# Patient Record
Sex: Female | Born: 1970
Health system: Southern US, Community
[De-identification: ages and names within clinical notes are randomized; demographics above are authoritative.]

## PROBLEM LIST (undated history)

## (undated) DIAGNOSIS — H5410 Blindness, one eye, low vision other eye, unspecified eyes: Secondary | ICD-10-CM

## (undated) DIAGNOSIS — H5440 Blindness, one eye, unspecified eye: Secondary | ICD-10-CM

## (undated) DIAGNOSIS — N289 Disorder of kidney and ureter, unspecified: Secondary | ICD-10-CM

## (undated) DIAGNOSIS — N186 End stage renal disease: Secondary | ICD-10-CM

## (undated) DIAGNOSIS — E785 Hyperlipidemia, unspecified: Secondary | ICD-10-CM

## (undated) DIAGNOSIS — I10 Essential (primary) hypertension: Secondary | ICD-10-CM

## (undated) DIAGNOSIS — Z992 Dependence on renal dialysis: Secondary | ICD-10-CM

## (undated) DIAGNOSIS — D649 Anemia, unspecified: Secondary | ICD-10-CM

## (undated) DIAGNOSIS — E119 Type 2 diabetes mellitus without complications: Secondary | ICD-10-CM

## (undated) DIAGNOSIS — H5450 Low vision, one eye, unspecified eye: Secondary | ICD-10-CM

## (undated) DIAGNOSIS — Z9289 Personal history of other medical treatment: Secondary | ICD-10-CM

## (undated) DIAGNOSIS — E039 Hypothyroidism, unspecified: Secondary | ICD-10-CM

## (undated) DIAGNOSIS — H409 Unspecified glaucoma: Secondary | ICD-10-CM

## (undated) DIAGNOSIS — J189 Pneumonia, unspecified organism: Secondary | ICD-10-CM

## (undated) HISTORY — DX: Essential (primary) hypertension: I10

## (undated) HISTORY — PX: TOE SURGERY: SHX1073

## (undated) HISTORY — DX: Unspecified glaucoma: H40.9

## (undated) HISTORY — DX: Disorder of kidney and ureter, unspecified: N28.9

## (undated) HISTORY — PX: CHOLECYSTECTOMY: SHX55

## (undated) HISTORY — DX: Type 2 diabetes mellitus without complications: E11.9

## (undated) HISTORY — PX: EYE SURGERY: SHX253

## (undated) HISTORY — PX: ANKLE FRACTURE SURGERY: SHX122

---

## 2012-07-31 DIAGNOSIS — H2140 Pupillary membranes, unspecified eye: Secondary | ICD-10-CM | POA: Insufficient documentation

## 2012-07-31 DIAGNOSIS — H4050X0 Glaucoma secondary to other eye disorders, unspecified eye, stage unspecified: Secondary | ICD-10-CM | POA: Insufficient documentation

## 2015-02-02 ENCOUNTER — Encounter: Payer: Self-pay | Admitting: Nutrition

## 2015-02-16 ENCOUNTER — Telehealth: Payer: Self-pay | Admitting: Nutrition

## 2015-02-16 NOTE — Telephone Encounter (Signed)
Called and left message to call to reschedule appointment. Jearld Fenton, RDN CDE

## 2015-07-15 ENCOUNTER — Telehealth: Payer: Self-pay | Admitting: Nutrition

## 2015-07-15 NOTE — Telephone Encounter (Signed)
Talked to her about rescheduling missed appointment. She will call back to reschedule appt. PC

## 2015-08-24 ENCOUNTER — Ambulatory Visit: Payer: Self-pay | Admitting: Nutrition

## 2015-09-01 LAB — HEMOGLOBIN A1C: Hgb A1c MFr Bld: 8.5 % — AB (ref 4.0–6.0)

## 2015-09-07 ENCOUNTER — Ambulatory Visit: Payer: Self-pay | Admitting: "Endocrinology

## 2015-09-21 ENCOUNTER — Ambulatory Visit: Payer: Self-pay | Admitting: "Endocrinology

## 2015-09-21 ENCOUNTER — Encounter: Payer: Self-pay | Admitting: "Endocrinology

## 2015-09-21 ENCOUNTER — Ambulatory Visit (INDEPENDENT_AMBULATORY_CARE_PROVIDER_SITE_OTHER): Payer: 59 | Admitting: "Endocrinology

## 2015-09-21 VITALS — BP 142/83 | HR 90 | Ht 66.0 in | Wt 269.0 lb

## 2015-09-21 DIAGNOSIS — E785 Hyperlipidemia, unspecified: Secondary | ICD-10-CM | POA: Diagnosis not present

## 2015-09-21 DIAGNOSIS — E039 Hypothyroidism, unspecified: Secondary | ICD-10-CM | POA: Diagnosis not present

## 2015-09-21 DIAGNOSIS — I10 Essential (primary) hypertension: Secondary | ICD-10-CM | POA: Diagnosis not present

## 2015-09-21 DIAGNOSIS — E1165 Type 2 diabetes mellitus with hyperglycemia: Secondary | ICD-10-CM | POA: Insufficient documentation

## 2015-09-21 DIAGNOSIS — Z794 Long term (current) use of insulin: Secondary | ICD-10-CM

## 2015-09-21 DIAGNOSIS — IMO0002 Reserved for concepts with insufficient information to code with codable children: Secondary | ICD-10-CM | POA: Insufficient documentation

## 2015-09-21 DIAGNOSIS — E118 Type 2 diabetes mellitus with unspecified complications: Secondary | ICD-10-CM | POA: Insufficient documentation

## 2015-09-21 DIAGNOSIS — E782 Mixed hyperlipidemia: Secondary | ICD-10-CM | POA: Insufficient documentation

## 2015-09-21 DIAGNOSIS — E1122 Type 2 diabetes mellitus with diabetic chronic kidney disease: Secondary | ICD-10-CM | POA: Diagnosis not present

## 2015-09-21 DIAGNOSIS — E119 Type 2 diabetes mellitus without complications: Secondary | ICD-10-CM | POA: Insufficient documentation

## 2015-09-21 MED ORDER — LEVOTHYROXINE SODIUM 50 MCG PO TABS: 50.0000 ug | ORAL_TABLET | Freq: Every day | ORAL | Status: DC

## 2015-09-21 MED ORDER — LIRAGLUTIDE 18 MG/3ML ~~LOC~~ SOPN: 1.8000 mg | PEN_INJECTOR | Freq: Once | SUBCUTANEOUS | Status: DC

## 2015-09-21 NOTE — Progress Notes (Signed)
Subjective:    Patient ID: Amy Moses, female    DOB: Apr 23, 1971,    Past Medical History  Diagnosis Date  . Diabetes mellitus, type II ([place])   . Hypertension   . Glaucoma   . Kidney disease    Past Surgical History  Procedure Laterality Date  . Eye surgery      Vatrectomy  . Cesarean section    . Cholecystectomy    . Ankle fracture surgery    . Toe surgery     Social History   Social History  . Marital Status: Single    Spouse Name: N/A  . Number of Children: N/A  . Years of Education: N/A   Social History Main Topics  . Smoking status: Never Smoker   . Smokeless tobacco: Never Used  . Alcohol Use: No  . Drug Use: No  . Sexual Activity: Yes    Birth Control/ Protection: Condom   Other Topics Concern  . None   Social History Narrative  . None   Outpatient Encounter Prescriptions as of 09/21/2015  Medication Sig  . amLODipine (NORVASC) 10 MG tablet Take 10 mg by mouth daily.  . insulin glargine (LANTUS) 100 UNIT/ML injection Inject 50 Units into the skin at bedtime.  . insulin lispro (HUMALOG) 100 UNIT/ML injection Inject 5-11 Units into the skin 3 (three) times daily before meals.  . Liraglutide (VICTOZA) 18 MG/3ML SOPN Inject 0.3 mLs (1.8 mg total) into the skin once.  Marland Kitchen losartan (COZAAR) 100 MG tablet Take 100 mg by mouth daily.  . metoprolol succinate (TOPROL-XL) 50 MG 24 hr tablet Take 50 mg by mouth daily. Take with or immediately following a meal.  . simvastatin (ZOCOR) 40 MG tablet Take 40 mg by mouth daily.  . Vitamin D, Ergocalciferol, (DRISDOL) 50000 UNITS CAPS capsule Take 50,000 Units by mouth every 7 (seven) days.  . [DISCONTINUED] Liraglutide (VICTOZA) 18 MG/3ML SOPN Inject into the skin.  Marland Kitchen levothyroxine (SYNTHROID, LEVOTHROID) 50 MCG tablet Take 1 tablet (50 mcg total) by mouth daily.   No facility-administered encounter medications on file as of 09/21/2015.   ALLERGIES: No Known Allergies VACCINATION STATUS:  There is no  immunization history on file for this patient.  Diabetes She presents for her follow-up diabetic visit. She has type 2 diabetes mellitus. Onset time: She was diagnosed at approximate age of 49 years. There are no hypoglycemic associated symptoms. Pertinent negatives for hypoglycemia include no confusion, headaches, pallor or seizures. Associated symptoms include polydipsia, polyphagia and polyuria. Pertinent negatives for diabetes include no chest pain and no fatigue. There are no hypoglycemic complications. Symptoms are improving. Diabetic complications include a CVA, nephropathy and retinopathy. Risk factors for coronary artery disease include diabetes mellitus, dyslipidemia, hypertension, obesity and sedentary lifestyle. She is compliant with treatment some of the time. She is following a generally unhealthy diet. She rarely participates in exercise. Home blood sugar record trend: She did not bring any logs nor meter to review. An ACE inhibitor/angiotensin II receptor blocker is being taken. Eye exam is current.  Thyroid Problem Presents for initial visit. Patient reports no cold intolerance, diarrhea, fatigue, heat intolerance or palpitations. The symptoms have been stable. Past treatments include nothing. The following procedures have not been performed: radioiodine uptake scan, thyroid FNA, thyroid ultrasound and thyroidectomy. Her past medical history is significant for hyperlipidemia.  Hyperlipidemia This is a chronic problem. The current episode started more than 1 year ago. Pertinent negatives include no chest pain, myalgias or shortness of  breath. Current antihyperlipidemic treatment includes statins.  Hypertension Pertinent negatives include no chest pain, headaches, palpitations or shortness of breath. Hypertensive end-organ damage includes CVA, retinopathy and a thyroid problem.     Review of Systems  Constitutional: Negative for fatigue and unexpected weight change.  HENT: Negative for  trouble swallowing and voice change.   Eyes: Negative for visual disturbance.  Respiratory: Negative for cough, shortness of breath and wheezing.   Cardiovascular: Negative for chest pain, palpitations and leg swelling.  Gastrointestinal: Negative for nausea, vomiting and diarrhea.  Endocrine: Positive for polydipsia, polyphagia and polyuria. Negative for cold intolerance and heat intolerance.  Musculoskeletal: Negative for myalgias and arthralgias.  Skin: Negative for color change, pallor, rash and wound.  Neurological: Negative for seizures and headaches.  Psychiatric/Behavioral: Negative for suicidal ideas and confusion.    Objective:    BP 142/83 mmHg  Pulse 90  Ht 5\' 6"  (1.676 m)  Wt 269 lb (122.018 kg)  BMI 43.44 kg/m2  SpO2 92%  LMP 08/22/2015 (Approximate)  Wt Readings from Last 3 Encounters:  09/21/15 269 lb (122.018 kg)    Physical Exam  Constitutional: She is oriented to person, place, and time. She appears well-developed.  HENT:  Head: Normocephalic and atraumatic.  Eyes: EOM are normal.  Neck: Normal range of motion. Neck supple. No tracheal deviation present. No thyromegaly present.  Cardiovascular: Normal rate and regular rhythm.   Pulmonary/Chest: Effort normal and breath sounds normal.  Abdominal: Soft. Bowel sounds are normal. There is no tenderness. There is no guarding.  Musculoskeletal: Normal range of motion. She exhibits no edema.  Neurological: She is alert and oriented to person, place, and time. No cranial nerve deficit. Coordination normal.  She uses her cane to get around.   Skin: Skin is warm and dry. No rash noted. No erythema. No pallor.  Psychiatric: She has a normal mood and affect. Judgment normal.      Assessment & Plan:   1. Uncontrolled type 2 diabetes mellitus with chronic kidney disease, with long-term current use of insulin, unspecified CKD stage ([place])  Her diabetes is  complicated by CKD and CVA and patient remains at a high risk  for more acute and chronic complications of diabetes which include CAD, CVA, CKD, retinopathy, and neuropathy. These are all discussed in detail with the patient.  Patient came without her  glucose profile nor meter  recent A1c of 8.5%.  Recent labs reviewed.   - I have re-counseled the patient on diet management and weight loss by adopting a carbohydrate restricted / protein rich  Diet.  - Suggestion is made for patient to avoid simple carbohydrates   from their diet including Cakes , Desserts, Ice Cream,  Soda (  diet and regular) , Sweet Tea , Candies,  Chips, Cookies, Artificial Sweeteners,   and "Sugar-free" Products .  This will help patient to have stable blood glucose profile and potentially avoid unintended  Weight gain.  - Patient is advised to stick to a routine mealtimes to eat 3 meals  a day and avoid unnecessary snacks ( to snack only to correct hypoglycemia).  - The patient  Has been  scheduled with Jearld Fenton, RDN, CDE for individualized DM education.  - I have approached patient with the following individualized plan to manage diabetes and patient agrees.  - Resume Lantus 50 units qhs, and Humalog 5units TIDAC for pre-meal BG readings of 90-150mg /dl, plus patient specific correction dose of rapid acting insulin for unexpected hyperglycemia above 150mg /dl,  associated with strict monitoring of BG AC and HS.  -Adjustment parameters for hypo and hyperglycemia were given in a written document to patient. -Patient is encouraged to call clinic for blood glucose levels less than 70 or above 300 mg /dl.  I encouraged her to increase Victoza to 1.8mg  daily. -Patient is not a candidate for MTF, SGLT2i due to CKD.  - Patient will be considered for incretin therapy as appropriate next visit. - Patient specific target  for A1c; LDL, HDL, Triglycerides, and  Waist Circumference were discussed in detail.  2) BP/HTN: Controlled. Continue current medications including ACEI/ARB. 3)  Lipids/HPL:  continue statins. 4)  Weight/Diet: CDE consult in progress, exercise, and carbohydrates information provided. 5) Hypothyroidism:  New diagnosis. I will initiate low dose LT4 50 mcg po qam. We discussed the correct way to take Lt4 in teh AM with water 1/2 before other meds or food. 6) Chronic Care/Health Maintenance:  -Patient is on ACEI/ARB and Statin medications and encouraged to continue to follow up with Ophthalmology, Podiatrist at least yearly or according to recommendations, and advised to  stay away from smoking. I have recommended yearly flu vaccine and pneumonia vaccination at least every 5 years; moderate intensity exercise for up to 150 minutes weekly; and  sleep for at least 7 hours a day.  I advised patient to maintain close follow up with their PCP for primary care needs.  Patient is asked to bring meter and  blood glucose logs during their next visit.   Follow up plan: Return in about 3 months (around 12/22/2015) for diabetes, high blood pressure, high cholesterol, underactive thyroid.  Glade Lloyd, MD Phone: [phone]  Fax: [phone]   09/21/2015, 4:13 PM

## 2015-09-21 NOTE — Patient Instructions (Signed)

## 2015-09-22 ENCOUNTER — Other Ambulatory Visit: Payer: Self-pay

## 2015-09-22 MED ORDER — LIRAGLUTIDE 18 MG/3ML ~~LOC~~ SOPN: 1.8000 mg | PEN_INJECTOR | Freq: Once | SUBCUTANEOUS | Status: DC

## 2015-09-30 ENCOUNTER — Encounter: Payer: Self-pay | Admitting: "Endocrinology

## 2015-10-05 ENCOUNTER — Ambulatory Visit: Payer: Self-pay | Admitting: Nutrition

## 2015-12-28 ENCOUNTER — Ambulatory Visit: Payer: 59 | Admitting: "Endocrinology

## 2019-01-28 DIAGNOSIS — E559 Vitamin D deficiency, unspecified: Secondary | ICD-10-CM | POA: Insufficient documentation

## 2019-01-28 DIAGNOSIS — L98491 Non-pressure chronic ulcer of skin of other sites limited to breakdown of skin: Secondary | ICD-10-CM | POA: Insufficient documentation

## 2019-03-05 DIAGNOSIS — N184 Chronic kidney disease, stage 4 (severe): Secondary | ICD-10-CM | POA: Insufficient documentation

## 2019-11-29 DIAGNOSIS — H65199 Other acute nonsuppurative otitis media, unspecified ear: Secondary | ICD-10-CM | POA: Insufficient documentation

## 2020-06-10 ENCOUNTER — Inpatient Hospital Stay (HOSPITAL_COMMUNITY): Payer: Medicaid - Out of State

## 2020-06-10 ENCOUNTER — Other Ambulatory Visit: Payer: Self-pay

## 2020-06-10 ENCOUNTER — Encounter (HOSPITAL_COMMUNITY): Payer: Self-pay | Admitting: Internal Medicine

## 2020-06-10 ENCOUNTER — Inpatient Hospital Stay (HOSPITAL_COMMUNITY)
Admission: EM | Admit: 2020-06-10 | Discharge: 2020-06-11 | DRG: 683 | Disposition: A | Discharge status: Home Health | Payer: Medicaid - Out of State | Source: Other Acute Inpatient Hospital | Attending: Family Medicine | Admitting: Family Medicine

## 2020-06-10 DIAGNOSIS — Z79890 Hormone replacement therapy (postmenopausal): Secondary | ICD-10-CM

## 2020-06-10 DIAGNOSIS — Z794 Long term (current) use of insulin: Secondary | ICD-10-CM

## 2020-06-10 DIAGNOSIS — E782 Mixed hyperlipidemia: Secondary | ICD-10-CM | POA: Diagnosis present

## 2020-06-10 DIAGNOSIS — J811 Chronic pulmonary edema: Secondary | ICD-10-CM | POA: Diagnosis present

## 2020-06-10 DIAGNOSIS — Z833 Family history of diabetes mellitus: Secondary | ICD-10-CM

## 2020-06-10 DIAGNOSIS — D509 Iron deficiency anemia, unspecified: Secondary | ICD-10-CM | POA: Diagnosis present

## 2020-06-10 DIAGNOSIS — Z79899 Other long term (current) drug therapy: Secondary | ICD-10-CM

## 2020-06-10 DIAGNOSIS — E6609 Other obesity due to excess calories: Secondary | ICD-10-CM | POA: Diagnosis present

## 2020-06-10 DIAGNOSIS — E1165 Type 2 diabetes mellitus with hyperglycemia: Secondary | ICD-10-CM | POA: Diagnosis present

## 2020-06-10 DIAGNOSIS — E785 Hyperlipidemia, unspecified: Secondary | ICD-10-CM | POA: Diagnosis present

## 2020-06-10 DIAGNOSIS — IMO0002 Reserved for concepts with insufficient information to code with codable children: Secondary | ICD-10-CM

## 2020-06-10 DIAGNOSIS — N189 Chronic kidney disease, unspecified: Secondary | ICD-10-CM | POA: Diagnosis present

## 2020-06-10 DIAGNOSIS — Z6838 Body mass index (BMI) 38.0-38.9, adult: Secondary | ICD-10-CM

## 2020-06-10 DIAGNOSIS — H5461 Unqualified visual loss, right eye, normal vision left eye: Secondary | ICD-10-CM | POA: Diagnosis present

## 2020-06-10 DIAGNOSIS — Z841 Family history of disorders of kidney and ureter: Secondary | ICD-10-CM

## 2020-06-10 DIAGNOSIS — Z20822 Contact with and (suspected) exposure to covid-19: Secondary | ICD-10-CM | POA: Diagnosis present

## 2020-06-10 DIAGNOSIS — H409 Unspecified glaucoma: Secondary | ICD-10-CM | POA: Diagnosis present

## 2020-06-10 DIAGNOSIS — N185 Chronic kidney disease, stage 5: Secondary | ICD-10-CM | POA: Diagnosis present

## 2020-06-10 DIAGNOSIS — D631 Anemia in chronic kidney disease: Secondary | ICD-10-CM | POA: Diagnosis present

## 2020-06-10 DIAGNOSIS — D649 Anemia, unspecified: Secondary | ICD-10-CM | POA: Diagnosis not present

## 2020-06-10 DIAGNOSIS — Z8249 Family history of ischemic heart disease and other diseases of the circulatory system: Secondary | ICD-10-CM

## 2020-06-10 DIAGNOSIS — R5383 Other fatigue: Secondary | ICD-10-CM | POA: Diagnosis present

## 2020-06-10 DIAGNOSIS — I313 Pericardial effusion (noninflammatory): Secondary | ICD-10-CM

## 2020-06-10 DIAGNOSIS — N179 Acute kidney failure, unspecified: Secondary | ICD-10-CM | POA: Diagnosis present

## 2020-06-10 DIAGNOSIS — E119 Type 2 diabetes mellitus without complications: Secondary | ICD-10-CM

## 2020-06-10 DIAGNOSIS — E1122 Type 2 diabetes mellitus with diabetic chronic kidney disease: Secondary | ICD-10-CM | POA: Diagnosis present

## 2020-06-10 DIAGNOSIS — E872 Acidosis, unspecified: Secondary | ICD-10-CM | POA: Diagnosis present

## 2020-06-10 DIAGNOSIS — E039 Hypothyroidism, unspecified: Secondary | ICD-10-CM | POA: Diagnosis present

## 2020-06-10 DIAGNOSIS — E66812 Obesity, class 2: Secondary | ICD-10-CM

## 2020-06-10 DIAGNOSIS — I309 Acute pericarditis, unspecified: Secondary | ICD-10-CM | POA: Diagnosis present

## 2020-06-10 DIAGNOSIS — Z888 Allergy status to other drugs, medicaments and biological substances status: Secondary | ICD-10-CM

## 2020-06-10 DIAGNOSIS — E118 Type 2 diabetes mellitus with unspecified complications: Secondary | ICD-10-CM

## 2020-06-10 DIAGNOSIS — E8770 Fluid overload, unspecified: Secondary | ICD-10-CM | POA: Diagnosis present

## 2020-06-10 DIAGNOSIS — I120 Hypertensive chronic kidney disease with stage 5 chronic kidney disease or end stage renal disease: Secondary | ICD-10-CM | POA: Diagnosis present

## 2020-06-10 DIAGNOSIS — E11319 Type 2 diabetes mellitus with unspecified diabetic retinopathy without macular edema: Secondary | ICD-10-CM | POA: Diagnosis present

## 2020-06-10 DIAGNOSIS — D638 Anemia in other chronic diseases classified elsewhere: Secondary | ICD-10-CM | POA: Diagnosis present

## 2020-06-10 DIAGNOSIS — I10 Essential (primary) hypertension: Secondary | ICD-10-CM | POA: Diagnosis present

## 2020-06-10 HISTORY — DX: Blindness, one eye, unspecified eye: H54.40

## 2020-06-10 HISTORY — DX: Blindness, one eye, low vision other eye, unspecified eyes: H54.10

## 2020-06-10 HISTORY — DX: Hypothyroidism, unspecified: E03.9

## 2020-06-10 HISTORY — DX: Hyperlipidemia, unspecified: E78.5

## 2020-06-10 HISTORY — DX: Low vision, one eye, unspecified eye: H54.50

## 2020-06-10 LAB — TROPONIN I (HIGH SENSITIVITY)
Troponin I (High Sensitivity): 18 ng/L — ABNORMAL HIGH (ref <18)
Troponin I (High Sensitivity): 19 ng/L — ABNORMAL HIGH (ref <18)

## 2020-06-10 LAB — LIPID PANEL
Cholesterol: 58 mg/dL (ref 0–200)
HDL: 28 mg/dL — ABNORMAL LOW (ref >40)
LDL Cholesterol: 19 mg/dL (ref 0–99)
Total CHOL/HDL Ratio: 2.1 RATIO
Triglycerides: 56 mg/dL (ref <150)
VLDL: 11 mg/dL (ref 0–40)

## 2020-06-10 LAB — RETICULOCYTES
Immature Retic Fract: 23 % — ABNORMAL HIGH (ref 2.3–15.9)
RBC.: 2.83 MIL/uL — ABNORMAL LOW (ref 3.87–5.11)
Retic Count, Absolute: 56.3 10*3/uL (ref 19.0–186.0)
Retic Ct Pct: 2 % (ref 0.4–3.1)

## 2020-06-10 LAB — HEMOGLOBIN A1C
Hgb A1c MFr Bld: 7.4 % — ABNORMAL HIGH (ref 4.8–5.6)
Mean Plasma Glucose: 165.68 mg/dL

## 2020-06-10 LAB — GLUCOSE, CAPILLARY
Glucose-Capillary: 136 mg/dL — ABNORMAL HIGH (ref 70–99)
Glucose-Capillary: 198 mg/dL — ABNORMAL HIGH (ref 70–99)
Glucose-Capillary: 128 mg/dL — ABNORMAL HIGH (ref 70–99)
Glucose-Capillary: 140 mg/dL — ABNORMAL HIGH (ref 70–99)

## 2020-06-10 LAB — COMPREHENSIVE METABOLIC PANEL
ALT: 14 U/L (ref 0–44)
AST: 12 U/L — ABNORMAL LOW (ref 15–41)
Albumin: 2.9 g/dL — ABNORMAL LOW (ref 3.5–5.0)
Alkaline Phosphatase: 129 U/L — ABNORMAL HIGH (ref 38–126)
Anion gap: 16 — ABNORMAL HIGH (ref 5–15)
BUN: 111 mg/dL — ABNORMAL HIGH (ref 6–20)
CO2: 16 mmol/L — ABNORMAL LOW (ref 22–32)
Calcium: 8.4 mg/dL — ABNORMAL LOW (ref 8.9–10.3)
Chloride: 104 mmol/L (ref 98–111)
Creatinine, Ser: 5.32 mg/dL — ABNORMAL HIGH (ref 0.44–1.00)
GFR calc Af Amer: 10 mL/min — ABNORMAL LOW (ref >60)
GFR calc non Af Amer: 9 mL/min — ABNORMAL LOW (ref >60)
Glucose, Bld: 211 mg/dL — ABNORMAL HIGH (ref 70–99)
Potassium: 3.8 mmol/L (ref 3.5–5.1)
Sodium: 136 mmol/L (ref 135–145)
Total Bilirubin: 1.4 mg/dL — ABNORMAL HIGH (ref 0.3–1.2)
Total Protein: 6.4 g/dL — ABNORMAL LOW (ref 6.5–8.1)

## 2020-06-10 LAB — CBC
HCT: 21.5 % — ABNORMAL LOW (ref 36.0–46.0)
Hemoglobin: 6.4 g/dL — CL (ref 12.0–15.0)
MCH: 22.3 pg — ABNORMAL LOW (ref 26.0–34.0)
MCHC: 29.8 g/dL — ABNORMAL LOW (ref 30.0–36.0)
MCV: 74.9 fL — ABNORMAL LOW (ref 80.0–100.0)
Platelets: 233 10*3/uL (ref 150–400)
RBC: 2.87 MIL/uL — ABNORMAL LOW (ref 3.87–5.11)
RDW: 19.2 % — ABNORMAL HIGH (ref 11.5–15.5)
WBC: 8.1 10*3/uL (ref 4.0–10.5)
nRBC: 0 % (ref 0.0–0.2)

## 2020-06-10 LAB — FERRITIN: Ferritin: 70 ng/mL (ref 11–307)

## 2020-06-10 LAB — IRON AND TIBC
Iron: 31 ug/dL (ref 28–170)
Saturation Ratios: 9 % — ABNORMAL LOW (ref 10.4–31.8)
TIBC: 343 ug/dL (ref 250–450)
UIBC: 312 ug/dL

## 2020-06-10 LAB — TSH: TSH: 11.206 u[IU]/mL — ABNORMAL HIGH (ref 0.350–4.500)

## 2020-06-10 LAB — ECHOCARDIOGRAM COMPLETE
Area-P 1/2: 5.27 cm2
Calc EF: 54.2 %
Height: 66 in
S' Lateral: 3.8 cm
Single Plane A2C EF: 43.7 %
Single Plane A4C EF: 60.3 %
Weight: 3844.82 oz

## 2020-06-10 LAB — PHOSPHORUS: Phosphorus: 7.3 mg/dL — ABNORMAL HIGH (ref 2.5–4.6)

## 2020-06-10 LAB — HIV ANTIBODY (ROUTINE TESTING W REFLEX): HIV Screen 4th Generation wRfx: NONREACTIVE

## 2020-06-10 LAB — MRSA PCR SCREENING: MRSA by PCR: NEGATIVE

## 2020-06-10 LAB — PREPARE RBC (CROSSMATCH)

## 2020-06-10 LAB — FOLATE: Folate: 8.6 ng/mL (ref >5.9)

## 2020-06-10 LAB — ABO/RH: ABO/RH(D): O POS

## 2020-06-10 LAB — VITAMIN B12: Vitamin B-12: 794 pg/mL (ref 180–914)

## 2020-06-10 MED ORDER — SODIUM BICARBONATE 650 MG PO TABS
1300.0000 mg | ORAL_TABLET | Freq: Three times a day (TID) | ORAL | Status: DC
  Administered 2020-06-10 – 2020-06-11 (×4): 1300 mg via ORAL
  Filled 2020-06-10 (×4): qty 2

## 2020-06-10 MED ORDER — CALCIUM CARBONATE ANTACID 1250 MG/5ML PO SUSP
500.0000 mg | Freq: Four times a day (QID) | ORAL | Status: DC | PRN
  Filled 2020-06-10: qty 5

## 2020-06-10 MED ORDER — DOCUSATE SODIUM 283 MG RE ENEM
1.0000 | ENEMA | RECTAL | Status: DC | PRN
  Filled 2020-06-10: qty 1

## 2020-06-10 MED ORDER — TORSEMIDE 100 MG PO TABS
100.0000 mg | ORAL_TABLET | Freq: Every day | ORAL | Status: DC
  Administered 2020-06-10 – 2020-06-11 (×2): 100 mg via ORAL
  Filled 2020-06-10 (×3): qty 1

## 2020-06-10 MED ORDER — ACETAMINOPHEN 325 MG PO TABS: 650.0000 mg | ORAL_TABLET | Freq: Four times a day (QID) | ORAL | Status: DC | PRN

## 2020-06-10 MED ORDER — LEVOTHYROXINE SODIUM 50 MCG PO TABS
50.0000 ug | ORAL_TABLET | Freq: Every day | ORAL | Status: DC
  Administered 2020-06-11: 50 ug via ORAL
  Filled 2020-06-10: qty 1

## 2020-06-10 MED ORDER — SODIUM CHLORIDE 0.9% IV SOLUTION: Freq: Once | INTRAVENOUS | Status: AC

## 2020-06-10 MED ORDER — SODIUM BICARBONATE 650 MG PO TABS
650.0000 mg | ORAL_TABLET | Freq: Three times a day (TID) | ORAL | Status: DC
  Administered 2020-06-10: 650 mg via ORAL
  Filled 2020-06-10: qty 1

## 2020-06-10 MED ORDER — SIMVASTATIN 20 MG PO TABS: 40.0000 mg | ORAL_TABLET | Freq: Every day | ORAL | Status: DC

## 2020-06-10 MED ORDER — LACTATED RINGERS IV SOLN: INTRAVENOUS | Status: DC

## 2020-06-10 MED ORDER — METOPROLOL SUCCINATE ER 50 MG PO TB24
50.0000 mg | ORAL_TABLET | Freq: Every day | ORAL | Status: DC
  Administered 2020-06-10 – 2020-06-11 (×2): 50 mg via ORAL
  Filled 2020-06-10 (×2): qty 1

## 2020-06-10 MED ORDER — ONDANSETRON HCL 4 MG/2ML IJ SOLN: 4.0000 mg | Freq: Four times a day (QID) | INTRAMUSCULAR | Status: DC | PRN

## 2020-06-10 MED ORDER — CAMPHOR-MENTHOL 0.5-0.5 % EX LOTN: 1.0000 "application " | TOPICAL_LOTION | Freq: Three times a day (TID) | CUTANEOUS | Status: DC | PRN

## 2020-06-10 MED ORDER — ACETAMINOPHEN 650 MG RE SUPP: 650.0000 mg | Freq: Four times a day (QID) | RECTAL | Status: DC | PRN

## 2020-06-10 MED ORDER — ONDANSETRON HCL 4 MG PO TABS: 4.0000 mg | ORAL_TABLET | Freq: Four times a day (QID) | ORAL | Status: DC | PRN

## 2020-06-10 MED ORDER — HYDROXYZINE HCL 25 MG PO TABS: 25.0000 mg | ORAL_TABLET | Freq: Three times a day (TID) | ORAL | Status: DC | PRN

## 2020-06-10 MED ORDER — SORBITOL 70 % SOLN: 30.0000 mL | Status: DC | PRN

## 2020-06-10 MED ORDER — SODIUM CHLORIDE 0.9% FLUSH
3.0000 mL | Freq: Two times a day (BID) | INTRAVENOUS | Status: DC
  Administered 2020-06-10 (×2): 3 mL via INTRAVENOUS

## 2020-06-10 MED ORDER — INSULIN GLARGINE 100 UNIT/ML ~~LOC~~ SOLN
50.0000 [IU] | Freq: Every day | SUBCUTANEOUS | Status: DC
  Administered 2020-06-10: 50 [IU] via SUBCUTANEOUS
  Filled 2020-06-10 (×2): qty 0.5

## 2020-06-10 MED ORDER — INSULIN ASPART 100 UNIT/ML ~~LOC~~ SOLN: 0.0000 [IU] | Freq: Every day | SUBCUTANEOUS | Status: DC

## 2020-06-10 MED ORDER — AMLODIPINE BESYLATE 10 MG PO TABS
10.0000 mg | ORAL_TABLET | Freq: Every day | ORAL | Status: DC
  Administered 2020-06-10 – 2020-06-11 (×2): 10 mg via ORAL
  Filled 2020-06-10 (×2): qty 1

## 2020-06-10 MED ORDER — NEPRO/CARBSTEADY PO LIQD: 237.0000 mL | Freq: Three times a day (TID) | ORAL | Status: DC | PRN

## 2020-06-10 MED ORDER — INSULIN ASPART 100 UNIT/ML ~~LOC~~ SOLN
0.0000 [IU] | Freq: Three times a day (TID) | SUBCUTANEOUS | Status: DC
  Administered 2020-06-10: 2 [IU] via SUBCUTANEOUS

## 2020-06-10 MED ORDER — FUROSEMIDE 10 MG/ML IJ SOLN
20.0000 mg | Freq: Once | INTRAMUSCULAR | Status: AC
  Administered 2020-06-10: 20 mg via INTRAVENOUS
  Filled 2020-06-10: qty 2

## 2020-06-10 MED ORDER — ZOLPIDEM TARTRATE 5 MG PO TABS: 5.0000 mg | ORAL_TABLET | Freq: Every evening | ORAL | Status: DC | PRN

## 2020-06-10 MED ORDER — ATORVASTATIN CALCIUM 10 MG PO TABS
10.0000 mg | ORAL_TABLET | Freq: Every day | ORAL | Status: DC
  Administered 2020-06-10 – 2020-06-11 (×2): 10 mg via ORAL
  Filled 2020-06-10 (×2): qty 1

## 2020-06-10 NOTE — H&P (Signed)
History and Physical    Amy Moses LZJ:[id number] DOB: 11-Jul-1971 DOA: 06/10/2020  PCP:  [facility] Consultants:  Theador Hawthorne - nephrology Patient coming from:  Home - lives with daughter and boyfriend; NOK: Daughter, [phone]  Chief Complaint:  Abnormal lab  HPI: Amy Moses is a 49 y.o. female with medical history significant of Stage 4-5 CKD (creatinine 4.57) and DM presenting with progressively worsening fatigue, dyspnea on exertion.  She has been having a lot of swelling in her legs.  In April, she went to her PCP and was told her kidney function was low and to keep her nephrologist.  She saw him on 7/1.  She is scheduled to see him again and had labs done yesterday; she was told her blood count was low and she needed to go to the ER.  They started doing labs, CT, and gave her blood.  She is feeling weak, SOB with ambulation.  She was given 1 unit last night for Hgb of 5.5.   They told her she had fluid around her heart.  She just ate a full breakfast.  She had nephrology evaluation last year in April or May - "they weren't great but they are nothing like they are now."  HD was not needed at that time.    Records reviewed from [facility].  She was sent by nephrology for evaluation due to anemia.  Hgb 5.9, heme negative.  CT chest with moderate pericardial effusion.  Transferred for availability of cardiology/nephrology.    ED Course:  [facility] to [name] transfer, per Dr. Cyd Silence:   Patient sent to [facility] ED from local urgent care clinic due to low Hgb, found to be 5.9.  No gross bleeding on exam/history.  Hemoccult negative. Thought to be secondary to progressive renal disease.    2 units PRBC ordered with Lasix after each unit.    CT chest revealed moderate pericardial effusion  with fluid tracking into the pericardial recess with evidence of pulmonary edema.  Chemistry reveals BUN/Cr of 120/5.9 (up from 65/4.57).  COVID negative, high sensitivity troponin 45, BNP 9,483.    [name] requesting transfer to [name] for Cardiology and Nephrology consultation and management.    Review of Systems: As per HPI; otherwise review of systems reviewed and negative.   Ambulatory Status:  Ambulates with a cane  COVID Vaccine Status: Complete  Past Medical History:  Diagnosis Date  . 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 Diabetes mellitus, type II ([place])   . Dyslipidemia   . Glaucoma   . Hypertension   . Hypothyroidism (acquired)   . Kidney disease     Past Surgical History:  Procedure Laterality Date  . ANKLE FRACTURE SURGERY    . CESAREAN SECTION    . CHOLECYSTECTOMY    . EYE SURGERY     Vatrectomy  . TOE SURGERY      Social History   Socioeconomic History  . Marital status: Single    Spouse name: Not on file  . Number of children: Not on file  . Years of education: Not on file  . Highest education level: Not on file  Occupational History  . Not on file  Tobacco Use  . Smoking status: Never Smoker  . Smokeless tobacco: Never Used  Substance and Sexual Activity  . Alcohol use: No  . Drug use: No  . Sexual activity: Yes    Birth control/protection: Condom  Other Topics  Concern  . Not on file  Social History Narrative  . Not on file   Social Determinants of Health   Financial Resource Strain:   . Difficulty of Paying Living Expenses:   Food Insecurity:   . Worried About Charity fundraiser in the Last Year:   . Arboriculturist in the Last Year:   Transportation Needs:   . Film/video editor (Medical):   Marland Kitchen Lack of Transportation (Non-Medical):   Physical Activity:   . Days of Exercise per Week:   . Minutes of Exercise per Session:   Stress:   . Feeling of Stress :   Social Connections:   . Frequency of Communication with Friends and Family:   . Frequency of Social Gatherings with Friends and Family:   . Attends Religious Services:   . Active Member of Clubs or Organizations:   . Attends  Archivist Meetings:   Marland Kitchen Marital Status:   Intimate Partner Violence:   . Fear of Current or Ex-Partner:   . Emotionally Abused:   Marland Kitchen Physically Abused:   . Sexually Abused:     Allergies  Allergen Reactions  . Lisinopril Cough    Family History  Problem Relation Age of Onset  . Heart disease Mother   . Diabetes Mother   . Kidney disease Mother   . Diabetes Father   . Heart disease Father   . Diabetes Brother     Prior to Admission medications   Medication Sig Start Date End Date Taking? Authorizing Provider  amLODipine (NORVASC) 10 MG tablet Take 10 mg by mouth daily.    [provider]  insulin glargine (LANTUS) 100 UNIT/ML injection Inject 50 Units into the skin at bedtime.    [provider]  insulin lispro (HUMALOG) 100 UNIT/ML injection Inject 5-11 Units into the skin 3 (three) times daily before meals.    [provider]  levothyroxine (SYNTHROID, LEVOTHROID) 50 MCG tablet Take 1 tablet (50 mcg total) by mouth daily. 09/21/15   Cassandria Anger, MD  Liraglutide (VICTOZA) 18 MG/3ML SOPN Inject 0.3 mLs (1.8 mg total) into the skin once. 09/22/15   Cassandria Anger, MD  losartan (COZAAR) 100 MG tablet Take 100 mg by mouth daily.    [provider]  metoprolol succinate (TOPROL-XL) 50 MG 24 hr tablet Take 50 mg by mouth daily. Take with or immediately following a meal.    [provider]  simvastatin (ZOCOR) 40 MG tablet Take 40 mg by mouth daily.    [provider]  Vitamin D, Ergocalciferol, (DRISDOL) 50000 UNITS CAPS capsule Take 50,000 Units by mouth every 7 (seven) days.    [provider]    Physical Exam: Vitals:   06/10/20 0445 06/10/20 0449 06/10/20 0958  BP:  (!) 150/77 (!) 149/76  Pulse:  72 74  Resp:  18 18  Temp:  97.8 F (36.6 C) 98 F (36.7 C)  TempSrc:  Oral Oral  SpO2:  98% 98%  Weight: 109 kg    Height: 5\' 6"  (1.676 m)       . General:  Appears calm and  comfortable and is NAD . Eyes:   normal L lid, iris; apparent R eye blindness with density loss and ptosis . ENT:  grossly normal hearing, lips & tongue, mmm . Neck:  no LAD, masses or thyromegaly . Cardiovascular:  RRR, 3/6 systolic murmur with rub appreciated. Mild LE edema (hard to grade due to extent of  chronic changes).  Marland Kitchen Respiratory:   CTA bilaterally with no wheezes/rales/rhonchi.  Normal respiratory effort. . Abdomen:  soft, NT, ND, NABS . Skin:  no rash or induration seen on limited exam; marked chronic lichenification of LE from stasis/edema but no erythema . Musculoskeletal:  grossly normal tone BUE/BLE, good ROM, no bony abnormality . Psychiatric:  grossly normal mood and affect, speech fluent and appropriate, AOx3 . Neurologic:  CN 2-12 grossly intact, moves all extremities in coordinated fashion    Radiological Exams on Admission: No results found.   CT - moderate pericardial effusion; 3v CAD; mild interlobular septal thickening and fissural thickening and peribronchovascular cuffing centrally suggestive of mild pulmonary edema, possible CHF/volume overload   EKG at [facility]:   NSR with rate 70; no reported evidence of acute ischemia; repeat at [facility] pending   Labs on Admission: I have personally reviewed the available labs and imaging studies at the time of the admission.  Pertinent labs at [facility]:   Anion gap 15 BUN 120/Creatinine 5.19/GFR 11 Glucose 197 Albumin 3.1 AP 167 WBC 8.5 Hgb 5.9 Pro-BNP 9483 HS troponin 45 Heme negative COVID negative  At [facility]: Glucose 136 WBC 8.1 Hgb 6.4 HS troponin 18, 19 Lipids: 58/28/19/56 MRSA PCR negative    Assessment/Plan Principal Problem:   Symptomatic anemia Active Problems:   Uncontrolled type 2 diabetes mellitus with chronic kidney disease, with long-term current use of insulin (HCC)   Hyperlipidemia   Essential hypertension, benign   Primary hypothyroidism   Acute on chronic renal failure (HCC)   Acute  pericardial effusion   Class 2 obesity due to excess calories with body mass index (BMI) of 38.0 to 38.9 in adult   Symptomatic anemia -Patient with known h/o advanced renal failure -She has been seeing outpatient nephrology and had outpatient labs which showed marked anemia  -She has been feeling very fatigued with DOE -Hgb 5.9 at [facility], transfused 1 unit PRBC -Hgb here 6.4 -Will transfuse 2 additional units -Will add TSH testing -Heme testing negative -Will admit to telemetry for now -Transfuse 2 units PRBC to start and recheck Hgb afterwards.  Given her very low starting Hgb, she is may require more units.   -Will give Lasix between units due to concern for CHF (see below) -Patient counseled about short- and long-term risks associated with transfusion and consents to receive blood products. -Anemia is thought to be secondary to CKD  Acute on chronic renal failure -Baseline creatinine is unknown, reported to be stage IV   -Today's creatinine is 5.19, GFR 11 - stage V -This either represents progression to ESRD (more likely) or AKI on CKD -Previously on Cozaar but this appears to have been stopped -Hold torsemide for now -Continue TID bicarbonate -No IVF for now as the patient appears to be volume overloaded (see below) -Follow up renal function by BMP -Avoid ACEI and NSAIDs and other nephrotoxic agents -Nephrology consult is pending  Pericardial effusion -patient was found to have a pericardial effusion on presentation at [facility] by CT -Stat echo has been obtained and is pending -This is thought to be associated with volume overload in the setting of advancing renal failure, there may also be a component of CHF -She appears likely to be heading towards HD and volume management via HD -For now awaiting echo results and will consult cardiology/CT surgery as needed based on results  HTN -Continue Norvasc - although this can be associated with edema - and Toprol XL  HLD -Continue  Lipitor -Lipids are  quite low on check today (total cholesterol is 58)  DM -Will check A1c -Continue Lantus -Cover with sensitive-scale SSI  Hypothyroidism -Check TSH -Continue Synthroid at current dose for now  Obesity Body mass index is 38.79 kg/m. -Weight loss should be encouraged -Outpatient PCP/bariatric medicine f/u encouraged     Note: This patient has been tested and is negative for the novel coronavirus COVID-19.  DVT prophylaxis:  SCDs Code Status:  Full - confirmed with patient Family Communication: None present Disposition Plan:  The patient is from: home  Anticipated d/c is to: home without [name] services   Anticipated d/c date will depend on clinical response to treatment, likely several days for now  Patient is currently: acutely ill Consults called: Nephrology; likely cardiology and/or CVTS  Admission status:  Admit - It is my clinical opinion that admission to INPATIENT is reasonable and necessary because of the expectation that this patient will require hospital care that crosses at least 2 midnights to treat this condition based on the medical complexity of the problems presented.  Given the aforementioned information, the predictability of an adverse outcome is felt to be significant.    Karmen Bongo MD [facility]   How to contact the [facility] Attending or Consulting provider [place] or covering provider during after hours [place], for this patient?  1. Check the care team in [facility] and look for a) attending/consulting TRH provider listed and b) the [facility] team listed 2. Log into [email or web address] and use [place]'s universal password to access. If you do not have the password, please contact the hospital operator. 3. Locate the [facility] provider you are looking for under [facility] and page to a number that you can be directly reached. 4. If you still have difficulty reaching the provider, please page the [facility] (Director on Call) for the Hospitalists listed  on amion for assistance.   06/10/2020, 10:29 AM

## 2020-06-10 NOTE — Consult Note (Signed)
Amy Moses Admit Date: 06/10/2020 06/10/2020 Amy Moses Requesting Physician:  Karmen Bongo, MD  Reason for Consult: AKI on CKD HPI:  49 year old black female with a past medical history of CKD stage V (follows with Dr. Theador Hawthorne with [facility]), diabetes mellitus type 2, diabetic retinopathy, hypertension, hypothyroidism who was sent to ER for hemoglobin of 5.5.  She is feeling weak and short of breath.  She received 1 unit of PRBC stool occult was negative.  CT chest was performed and showed a moderate pericardial effusion. She saw Dr. Theador Hawthorne in July and was told her EGFR was 12%.  She is pending an appointment with an outpatient vascular surgeon for an AV fistula placement.  She does report that her legs are significantly swollen and was placed on torsemide 100 mg daily by her nephrologist.  She otherwise denies any nausea/vomiting/loss of appetite, dysgeusia, abnormal sleep patterns, episodes of confusion, diffuse pruritus, chest pain, orthopnea.  She overall feels slightly better after receiving blood. She denies any over-the-counter medication use i.e. NSAIDs or herbal supplements.  PMH Incudes: Past Medical History:  Diagnosis Date  . 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 Diabetes mellitus, type II ([place])   . Dyslipidemia   . Glaucoma   . Hypertension   . Hypothyroidism (acquired)   . Kidney disease        Creatinine, Ser (mg/dL)  Date Value  06/10/2020 5.32 (H)  ] I/Os:  ROS NSAIDS: None IV Contrast: none TMP/SMX: none Hypotension: none Balance of 12 systems is negative w/ exceptions as above  PMH  Past Medical History:  Diagnosis Date  . 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 Diabetes mellitus, type II ([place])   . Dyslipidemia   . Glaucoma   . Hypertension   . Hypothyroidism (acquired)   . Kidney disease    PSH  Past Surgical History:  Procedure Laterality Date  . ANKLE  FRACTURE SURGERY    . CESAREAN SECTION    . CHOLECYSTECTOMY    . EYE SURGERY     Vatrectomy  . TOE SURGERY     FH  Family History  Problem Relation Age of Onset  . Heart disease Mother   . Diabetes Mother   . Kidney disease Mother   . Diabetes Father   . Heart disease Father   . Diabetes Brother    SH  reports that she has never smoked. She has never used smokeless tobacco. She reports that she does not drink alcohol and does not use drugs. Allergies  Allergies  Allergen Reactions  . Lisinopril Cough   Home medications Prior to Admission medications   Medication Sig Start Date End Date Taking? Authorizing Provider  amLODipine (NORVASC) 10 MG tablet Take 10 mg by mouth daily.   Yes [provider]  atorvastatin (LIPITOR) 10 MG tablet Take 10 mg by mouth daily. 05/14/20  Yes [provider]  HUMALOG KWIKPEN 100 UNIT/ML KwikPen Inject 3-15 Units into the skin in the morning, at noon, and at bedtime. 03/03/20  Yes [provider]  insulin glargine (LANTUS) 100 UNIT/ML injection Inject 50 Units into the skin at bedtime.   Yes [provider]  levothyroxine (SYNTHROID, LEVOTHROID) 50 MCG tablet Take 1 tablet (50 mcg total) by mouth daily. 09/21/15  Yes Nida, Marella Chimes, MD  metoprolol succinate (TOPROL-XL) 50 MG 24 hr tablet Take 50 mg by mouth at  bedtime. Take with or immediately following a meal.    Yes [provider]  sodium bicarbonate 650 MG tablet Take 650 mg by mouth in the morning, at noon, and at bedtime. 05/21/20  Yes [provider]  torsemide (DEMADEX) 100 MG tablet Take 100 mg by mouth daily. 05/22/20  Yes [provider]    Current Medications Scheduled Meds: . sodium chloride   Intravenous Once  . amLODipine  10 mg Oral Daily  . atorvastatin  10 mg Oral Daily  . furosemide  20 mg Intravenous Once  . insulin aspart  0-5 Units Subcutaneous QHS  . insulin aspart  0-9 Units Subcutaneous TID WC  . insulin  glargine  50 Units Subcutaneous QHS  . [START ON 06/11/2020] levothyroxine  50 mcg Oral Daily  . metoprolol succinate  50 mg Oral Daily  . sodium bicarbonate  650 mg Oral TID  . sodium chloride flush  3 mL Intravenous Q12H   Continuous Infusions: PRN Meds:.acetaminophen **OR** acetaminophen, calcium carbonate (dosed in mg elemental calcium), camphor-menthol **AND** hydrOXYzine, docusate sodium, feeding supplement (NEPRO CARB STEADY), ondansetron **OR** ondansetron (ZOFRAN) IV, sorbitol, zolpidem  CBC Recent Labs  Lab 06/10/20 0631  WBC 8.1  HGB 6.4*  HCT 21.5*  MCV 74.9*  PLT 606   Basic Metabolic Panel Recent Labs  Lab 06/10/20 1154  NA 136  K 3.8  CL 104  CO2 16*  GLUCOSE 211*  BUN 111*  CREATININE 5.32*  CALCIUM 8.4*    Physical Exam  Blood pressure (!) 144/67, pulse 75, temperature 98.8 F (37.1 C), temperature source Oral, resp. rate 18, height 5' 6"  (1.676 m), weight 109 kg, SpO2 97 %. GEN: wdwn, sitting in bed, nad ENT: no nasal discharge, mmm EYES: no scleral icterus, eomi CV: normal rate, no murmurs, no pericardial rub PULM: no iwob, bilateral chest rise ABD: NABS, non-distended SKIN: no rashes or jaundice EXT: 2+ edema (firm) bilateral lower extremities Neuro: No asterixis, speech clear and coherent, moves all extremities spontaneously, alert and orient x3   Assessment 1.  CKD G5A3, likely secondary to diabetic nephropathy (macroalbuminuric) along with hypertensive arteriosclerosis. Rise in creatinine can likely be due to anemia leading to diminished perfusion. Questionable if there is a bleed given acute rise in BUN? 2. Symptomatic Anemia, microcytic. Requiring prbc transfusions 3. Pericardial effusion 4.  Metabolic acidosis likely due to CKD 5.  Hypertension 6. DM2   Plan 1. No current indication to start renal replacement therapy.  She is open to have dialysis access placed by an outpatient vascular surgeon.  We will continue to monitor if any needs  arise.  If she were to start dialysis while in-house then would favor her getting a tunneled dialysis catheter as well as a vascular surgery consult for fistula/graft placement 2. Check iron panel will determine if she is going to have any iron replacement +/-ESA needs 3. Check phosphorus and PTH 4. Can place her back on home torsemide especially since he is receiving.  BC transfusions 5. Echo reviewed which revealed a small pericardial effusion.  No tamponade 6. Increase sodium bicarbonate to 1300 mg 3 times daily (ideal goal bicarb greater than 22) 7. Avoid nephrotoxic medications including NSAIDs and iodinated intravenous contrast exposure unless the latter is absolutely indicated.  Preferred narcotic agents for pain control are hydromorphone, fentanyl, and methadone. Morphine should not be used. Avoid Baclofen and avoid oral sodium phosphate and magnesium citrate based laxatives / bowel preps. Continue strict Input and Output monitoring. Will monitor  the patient closely with you and intervene or adjust therapy as indicated by changes in clinical status/labs   Amy Quint, MD [place] 06/10/2020, 1:19 PM

## 2020-06-10 NOTE — Progress Notes (Signed)
  Echocardiogram 2D Echocardiogram has been performed.  Darlina Sicilian M 06/10/2020, 10:08 AM

## 2020-06-10 NOTE — Progress Notes (Signed)
New Admission Note:   Arrival Method: Arrived from [facility] via [facility] Mental Orientation: Alert and oriented x4 Telemetry: Box #6 Assessment: Completed Skin: Intact IV: Rt AC,Rt FA-PRBC complete,green tag not with the PRBC bag Pain: Denies Tubes: N/A Safety Measures: Safety Fall Prevention Plan has been discussed.  Admission: Completed. [facility] flow manager made aware of pt's arrival. MD,Gardner to come see pt. 5MW Orientation: Patient has been orientated to the room, unit and staff.  Family: Daughter,Shauntea notified.  Will continue to monitor the patient. Call light has been placed within reach and bed alarm has been activated.   Salomon Ganser [employer], RN-BC Phone number: [phone]

## 2020-06-10 NOTE — Progress Notes (Signed)
CRITICAL VALUE ALERT  Critical Value:  Hgb=6.4  Date & Time Notied:  06/10/20 07:20  Provider Notified: Serita Butcher via text @ 06/10/20 07:25  Orders Received/Actions taken: Awaiting order

## 2020-06-10 NOTE — TOC Initial Note (Signed)
Transition of Care [facility]) - Initial/Assessment Note    Patient Details  Name: Amy Moses MRN: [id number] Date of Birth: 1971-06-05  [facility]) CM/SW Contact:    Amy Collet, RN Phone Number: 06/10/2020, 11:03 AM  Clinical Narrative:       Amy Moses w patient at bedside. She states she lives at home with her 47 year old daughter who is "a blessing." She will be supportive at DC. She confirms she has no insurance. She states she lost her job with [name] after 19 years and insurance stopped within days. She states she has a week of insulin left at home, Lantus and Humalog. She uses [facility] and pays out of pocket for prescriptions. Her PCP is [employer], added to AVS.  Follow for Amy Moses at DC. Need to consider Diabetes Educator consult to assess for transitioning to 70/30 if Lantus and Humalog is cost prohibitive for this newly uninsured patient.             Expected Discharge Plan: Home/Self Care Barriers to Discharge: Continued Medical Work up, Inadequate or no insurance   Patient Goals and CMS Choice Patient states their goals for this hospitalization and ongoing recovery are:: to go home      Expected Discharge Plan and Services Expected Discharge Plan: Home/Self Care                                              Prior Living Arrangements/Services                       Activities of Daily Living Home Assistive Devices/Equipment: CBG Meter, Cane (specify quad or straight) ADL Screening (condition at time of admission) Patient's cognitive ability adequate to safely complete daily activities?: Yes Is the patient deaf or have difficulty hearing?: No Does the patient have difficulty seeing, even when wearing glasses/contacts?: Yes Does the patient have difficulty concentrating, remembering, or making decisions?: No Patient able to express need for assistance with ADLs?: Yes Does the patient have difficulty dressing or bathing?:  Yes Independently performs ADLs?: No Communication: Independent Dressing (OT): Needs assistance Is this a change from baseline?: Change from baseline, expected to last <3days Grooming: Needs assistance Is this a change from baseline?: Change from baseline, expected to last <3 days Feeding: Independent Bathing: Needs assistance Is this a change from baseline?: Change from baseline, expected to last <3 days Toileting: Needs assistance Is this a change from baseline?: Change from baseline, expected to last <3 days In/Out Bed: Needs assistance Is this a change from baseline?: Change from baseline, expected to last <3 days Walks in Home: Needs assistance Is this a change from baseline?: Change from baseline, expected to last <3 days Does the patient have difficulty walking or climbing stairs?: Yes Weakness of Legs: Both Weakness of Arms/Hands: Both  Permission Sought/Granted                  Emotional Assessment              Admission diagnosis:  Acute on chronic renal failure (Amy Moses) [N17.9, N18.9] Patient Active Problem List   Diagnosis Date Noted  . Acute on chronic renal failure ([place]) 06/10/2020  . Symptomatic anemia 06/10/2020  . Acute pericardial effusion 06/10/2020  . Class 2 obesity due to excess calories with body mass index (BMI) of 38.0 to  38.9 in adult 06/10/2020  . Uncontrolled type 2 diabetes mellitus with chronic kidney disease, with long-term current use of insulin ([place]) 09/21/2015  . Hyperlipidemia 09/21/2015  . Essential hypertension, benign 09/21/2015  . Primary hypothyroidism 09/21/2015   PCP:  Leeanne Rio, MD Pharmacy:   [name] [place], [place] [name] [address] Phone: [phone] Fax: [phone]     Social Determinants of Health (SDOH) Interventions    Readmission Risk Interventions No flowsheet data found.

## 2020-06-11 DIAGNOSIS — E872 Acidosis, unspecified: Secondary | ICD-10-CM | POA: Diagnosis present

## 2020-06-11 LAB — TYPE AND SCREEN
ABO/RH(D): O POS
Antibody Screen: NEGATIVE
Unit division: 0
Unit division: 0

## 2020-06-11 LAB — BASIC METABOLIC PANEL
Anion gap: 14 (ref 5–15)
BUN: 114 mg/dL — ABNORMAL HIGH (ref 6–20)
CO2: 17 mmol/L — ABNORMAL LOW (ref 22–32)
Calcium: 8.4 mg/dL — ABNORMAL LOW (ref 8.9–10.3)
Chloride: 106 mmol/L (ref 98–111)
Creatinine, Ser: 5.4 mg/dL — ABNORMAL HIGH (ref 0.44–1.00)
GFR calc Af Amer: 10 mL/min — ABNORMAL LOW (ref >60)
GFR calc non Af Amer: 9 mL/min — ABNORMAL LOW (ref >60)
Glucose, Bld: 108 mg/dL — ABNORMAL HIGH (ref 70–99)
Potassium: 4.2 mmol/L (ref 3.5–5.1)
Sodium: 137 mmol/L (ref 135–145)

## 2020-06-11 LAB — BPAM RBC
Blood Product Expiration Date: 202108212359
Blood Product Expiration Date: 202108212359
ISSUE DATE / TIME: 202107211507
ISSUE DATE / TIME: 202107211844
Unit Type and Rh: 5100
Unit Type and Rh: 5100

## 2020-06-11 LAB — T4, FREE: Free T4: 1.38 ng/dL — ABNORMAL HIGH (ref 0.61–1.12)

## 2020-06-11 LAB — CBC
HCT: 26.1 % — ABNORMAL LOW (ref 36.0–46.0)
HCT: 25.9 % — ABNORMAL LOW (ref 36.0–46.0)
Hemoglobin: 8.1 g/dL — ABNORMAL LOW (ref 12.0–15.0)
Hemoglobin: 8 g/dL — ABNORMAL LOW (ref 12.0–15.0)
MCH: 23.6 pg — ABNORMAL LOW (ref 26.0–34.0)
MCH: 23.5 pg — ABNORMAL LOW (ref 26.0–34.0)
MCHC: 31 g/dL (ref 30.0–36.0)
MCHC: 30.9 g/dL (ref 30.0–36.0)
MCV: 76.1 fL — ABNORMAL LOW (ref 80.0–100.0)
MCV: 76 fL — ABNORMAL LOW (ref 80.0–100.0)
Platelets: 232 10*3/uL (ref 150–400)
Platelets: 225 10*3/uL (ref 150–400)
RBC: 3.43 MIL/uL — ABNORMAL LOW (ref 3.87–5.11)
RBC: 3.41 MIL/uL — ABNORMAL LOW (ref 3.87–5.11)
RDW: 19.6 % — ABNORMAL HIGH (ref 11.5–15.5)
RDW: 19.4 % — ABNORMAL HIGH (ref 11.5–15.5)
WBC: 9.3 10*3/uL (ref 4.0–10.5)
WBC: 8.9 10*3/uL (ref 4.0–10.5)
nRBC: 0 % (ref 0.0–0.2)
nRBC: 0 % (ref 0.0–0.2)

## 2020-06-11 LAB — GLUCOSE, CAPILLARY
Glucose-Capillary: 73 mg/dL (ref 70–99)
Glucose-Capillary: 106 mg/dL — ABNORMAL HIGH (ref 70–99)
Glucose-Capillary: 109 mg/dL — ABNORMAL HIGH (ref 70–99)

## 2020-06-11 MED ORDER — SODIUM CHLORIDE 0.9 % IV SOLN
510.0000 mg | INTRAVENOUS | Status: DC
  Administered 2020-06-11: 510 mg via INTRAVENOUS
  Filled 2020-06-11: qty 17

## 2020-06-11 MED ORDER — LEVOTHYROXINE SODIUM 75 MCG PO TABS: 75.0000 ug | ORAL_TABLET | Freq: Every day | ORAL | 0 refills | Status: DC

## 2020-06-11 MED ORDER — SODIUM BICARBONATE 650 MG PO TABS: 1300.0000 mg | ORAL_TABLET | Freq: Three times a day (TID) | ORAL | 0 refills | Status: AC

## 2020-06-11 MED FILL — SODIUM BICARBONATE 650 MG T: 650 | 30 days supply | Qty: 180 | Fill #0

## 2020-06-11 MED FILL — LEVOTHYROXINE SODIUM 75 MCG: 75 | 30 days supply | Qty: 30 | Fill #0

## 2020-06-11 NOTE — Progress Notes (Addendum)
Inpatient Diabetes Program Recommendations  AACE/ADA: New Consensus Statement on Inpatient Glycemic Control (2015)  Target Ranges:  Prepandial:   less than 140 mg/dL      Peak postprandial:   less than 180 mg/dL (1-2 hours)      Critically ill patients:  140 - 180 mg/dL   Lab Results  Component Value Date   GLUCAP 73 06/11/2020   HGBA1C 7.4 (H) 06/10/2020    Review of Glycemic Control Results for Amy Moses, Amy Moses (MRN [id number]) as of 06/11/2020 09:47  Ref. Range 06/10/2020 06:32 06/10/2020 11:09 06/10/2020 16:17 06/10/2020 20:28 06/11/2020 06:46  Glucose-Capillary Latest Ref Range: 70 - 99 mg/dL 136 (H) 198 (H) 128 (H) 140 (H) 73   Diabetes history: DM 2 Outpatient Diabetes medications: Lantus 50 units, Humalog 3-15 units tid Current orders for Inpatient glycemic control:  Lantus 50 units Novolog 0-9 units tid + hs  A1c 7.4 on 7/21 noted low Hgb not accurate  Inpatient Diabetes Program Recommendations:    Fasting glucose 73 this am.   -Reduce Lantus to 40-45 units.  Thanks,  Tama Headings RN, MSN, BC-ADM Inpatient Diabetes Coordinator Team Pager [phone] (8a-5p)

## 2020-06-11 NOTE — Progress Notes (Signed)
[facility] Progress Note    Assessment/ Plan:   1. CKD G5A3 (follows with Dr. Theador Hawthorne, [facility]) -No indication for renal replacement therapy at this junction not exhibiting any uremic symptoms whatsoever with a BUN of 114.  Can likely pursue outpatient dialysis planning as she was before.  If anticipating a prolonged stay here at the hospital thank you consult vascular surgery for access placement preemptively as she was already planning on doing this as an outpatient. -CKD education provided to patient today -Avoid nephrotoxic medications including NSAIDs and iodinated intravenous contrast exposure unless the latter is absolutely indicated.  Preferred narcotic agents for pain control are hydromorphone, fentanyl, and methadone. Morphine should not be used. Avoid Baclofen and avoid oral sodium phosphate and magnesium citrate based laxatives / bowel preps. Continue strict Input and Output monitoring. Will monitor the patient closely with you and intervene or adjust therapy as indicated by changes in clinical status/labs  2. Anemia, microcytic -neg stool occult here -starting feraheme load x 2 doses 3. Pericardial effusion -Echo revealed a small pericardial effusion, no tamponade physiology 4. CKD-MBD -Phos restricted diet 5.  Metabolic acidosis, stable -Continue with sodium bicarbonate 1300 mg 3 times daily 6.  Hypervolemia -Continue with home torsemide dose at 100 mg daily  Subjective:   No acute events, no complaints.  Legs are still swollen but overall unchanged.  Denies any orthopnea, shortness of breath, paroxysmal nocturnal dyspnea, nausea/vomiting, loss of appetite, black tarry stools, hematemesis, episodes of confusion, dysgeusia, abnormal sleep patterns, pruritus, hiccups.   Objective:   BP 138/65 (BP Location: Left Arm)   Pulse 78   Temp 98.4 F (36.9 C) (Oral)   Resp 18   Ht 5\' 6"  (1.676 m)   Wt 109 kg   SpO2 99%   BMI 38.79 kg/m   Intake/Output Summary  (Last 24 hours) at 06/11/2020 1426 Last data filed at 06/11/2020 1300 Gross per 24 hour  Intake 1346.5 ml  Output 0 ml  Net 1346.5 ml   Weight change:   Physical Exam: Gen: No acute distress CVS: Regular rate, S1-S2 Resp: Clear to auscultation bilaterally, no wheezes/rhonchi/rales/crackles, unlabored, speaking full sentences, bilateral chest expansion Abd: Soft, nontender nondistended Ext: 2+ pitting edema bilateral lower extremities Neuro: Alert and orient x3, no asterixis, speech clear and coherent  Imaging: ECHOCARDIOGRAM COMPLETE  Result Date: 06/10/2020    ECHOCARDIOGRAM REPORT   Patient Name:   Amy Moses Date of Exam: 06/10/2020 Medical Rec #:  [id number]       Height:       66.0 in Accession #:    [id number]      Weight:       240.3 lb Date of Birth:  1971/09/29      BSA:          2.162 m Patient Age:    49 years        BP:           149/76 mmHg Patient Gender: F               HR:           74 bpm. Exam Location:  Inpatient Procedure: 2D Echo STAT ECHO Indications:    Pericardial effusion 423.9 / I31.3  History:        Patient has no prior history of Echocardiogram examinations.                 Risk Factors:Diabetes and Hypertension. Chronic kidney disease.  Arrived from [facility] via [facility]. Dyspnea on exertion,                 but not at rest.  Sonographer:    Darlina Sicilian RDCS Referring Phys: [place]  1. Left ventricular ejection fraction, by estimation, is 55%. The left ventricle has normal function. The left ventricle has no regional wall motion abnormalities. The left ventricular internal cavity size was mildly dilated. There is mild left ventricular hypertrophy. Left ventricular diastolic parameters are consistent with Grade II diastolic dysfunction (pseudonormalization).  2. Right ventricular systolic function is normal. The right ventricular size is normal. There is moderately elevated pulmonary artery systolic pressure. The  estimated right ventricular systolic pressure is 17.6 mmHg.  3. Left atrial size was mildly dilated.  4. The mitral valve is normal in structure. Mild mitral valve regurgitation. No evidence of mitral stenosis.  5. The aortic valve is tricuspid. Aortic valve regurgitation is not visualized. No aortic stenosis is present.  6. The inferior vena cava is dilated in size with <50% respiratory variability, suggesting right atrial pressure of 15 mmHg.  7. There is a small circumferential pericardial effusion. The IVC is dilated, but suspect this is due to CHF and not tamponade. No evidence for tamponade. FINDINGS  Left Ventricle: Left ventricular ejection fraction, by estimation, is 55%. The left ventricle has normal function. The left ventricle has no regional wall motion abnormalities. The left ventricular internal cavity size was mildly dilated. There is mild left ventricular hypertrophy. Left ventricular diastolic parameters are consistent with Grade II diastolic dysfunction (pseudonormalization). Right Ventricle: The right ventricular size is normal. No increase in right ventricular wall thickness. Right ventricular systolic function is normal. There is moderately elevated pulmonary artery systolic pressure. The tricuspid regurgitant velocity is 3.26 m/s, and with an assumed right atrial pressure of 15 mmHg, the estimated right ventricular systolic pressure is 16.0 mmHg. Left Atrium: Left atrial size was mildly dilated. Right Atrium: Right atrial size was normal in size. Pericardium: There is a small circumferential pericardial effusion. The IVC is dilated, but suspect this is due to CHF and not tamponade. No evidence for tamponade. A small pericardial effusion is present. Mitral Valve: The mitral valve is normal in structure. There is mild calcification of the mitral valve leaflet(s). Mild mitral annular calcification. Mild mitral valve regurgitation. No evidence of mitral valve stenosis. Tricuspid Valve: The  tricuspid valve is normal in structure. Tricuspid valve regurgitation is mild. Aortic Valve: The aortic valve is tricuspid. Aortic valve regurgitation is not visualized. No aortic stenosis is present. Pulmonic Valve: The pulmonic valve was normal in structure. Pulmonic valve regurgitation is trivial. Aorta: The aortic root is normal in size and structure. Venous: The inferior vena cava is dilated in size with less than 50% respiratory variability, suggesting right atrial pressure of 15 mmHg. IAS/Shunts: No atrial level shunt detected by color flow Doppler.  LEFT VENTRICLE PLAX 2D LVIDd:         5.60 cm      Diastology LVIDs:         3.80 cm      LV e' lateral:   8.32 cm/s LV PW:         0.90 cm      LV E/e' lateral: 13.9 LV IVS:        0.90 cm      LV e' medial:    6.13 cm/s LVOT diam:     1.80 cm  LV E/e' medial:  18.9 LV SV:         77 LV SV Index:   36 LVOT Area:     2.54 cm  LV Volumes (MOD) LV vol d, MOD A2C: 148.0 ml LV vol d, MOD A4C: 174.0 ml LV vol s, MOD A2C: 83.3 ml LV vol s, MOD A4C: 69.1 ml LV SV MOD A2C:     64.7 ml LV SV MOD A4C:     174.0 ml LV SV MOD BP:      91.0 ml RIGHT VENTRICLE RV S prime:     13.80 cm/s TAPSE (M-mode): 2.6 cm LEFT ATRIUM             Index       RIGHT ATRIUM           Index LA diam:        4.50 cm 2.08 cm/m  RA Area:     14.30 cm LA Vol (A2C):   74.8 ml 34.60 ml/m RA Volume:   33.50 ml  15.49 ml/m LA Vol (A4C):   65.5 ml 30.29 ml/m LA Biplane Vol: 74.7 ml 34.55 ml/m  AORTIC VALVE LVOT Vmax:   136.00 cm/s LVOT Vmean:  88.700 cm/s LVOT VTI:    0.302 m  AORTA Ao Root diam: 2.90 cm MITRAL VALVE                TRICUSPID VALVE MV Area (PHT): 5.27 cm     TR Peak grad:   42.5 mmHg MV Decel Time: 144 msec     TR Vmax:        326.00 cm/s MV E velocity: 116.00 cm/s MV A velocity: 110.00 cm/s  SHUNTS MV E/A ratio:  1.05         Systemic VTI:  0.30 m                             Systemic Diam: 1.80 cm Loralie Champagne MD Electronically signed by Loralie Champagne MD Signature Date/Time:  06/10/2020/12:03:12 PM    Final     Labs: BMET Recent Labs  Lab 06/10/20 1154 06/10/20 1410 06/11/20 0336  NA 136  --  137  K 3.8  --  4.2  CL 104  --  106  CO2 16*  --  17*  GLUCOSE 211*  --  108*  BUN 111*  --  114*  CREATININE 5.32*  --  5.40*  CALCIUM 8.4*  --  8.4*  PHOS  --  7.3*  --    CBC Recent Labs  Lab 06/10/20 0631 06/11/20 0004 06/11/20 0336  WBC 8.1 9.3 8.9  HGB 6.4* 8.1* 8.0*  HCT 21.5* 26.1* 25.9*  MCV 74.9* 76.1* 76.0*  PLT 233 232 225    Medications:    . amLODipine  10 mg Oral Daily  . atorvastatin  10 mg Oral Daily  . insulin aspart  0-5 Units Subcutaneous QHS  . insulin aspart  0-9 Units Subcutaneous TID WC  . insulin glargine  50 Units Subcutaneous QHS  . levothyroxine  50 mcg Oral Daily  . metoprolol succinate  50 mg Oral Daily  . sodium bicarbonate  1,300 mg Oral TID  . sodium chloride flush  3 mL Intravenous Q12H  . torsemide  100 mg Oral Daily      Gean Quint, MD [facility] 06/11/2020, 2:26 PM

## 2020-06-11 NOTE — Progress Notes (Signed)
DISCHARGE NOTE HOME Amy Moses to be discharged to home per MD order. Discussed prescriptions and follow up appointments with the patient. Prescriptions given to patient; medication list explained in detail. Patient verbalized understanding.  Skin clean, dry and intact without evidence of skin break down, no evidence of skin tears noted. IV catheter discontinued intact. Site without signs and symptoms of complications. Dressing and pressure applied. Pt denies pain at the site currently. No complaints noted.  Patient free of lines, drains, and wounds.   An After Visit Summary (AVS) was printed and given to the patient. Patient escorted via wheelchair, and discharged home via private auto.  Carney, Zenon Mayo, RN

## 2020-06-11 NOTE — Discharge Instructions (Signed)
Anemia  Anemia is a condition in which you do not have enough red blood cells or hemoglobin. Hemoglobin is a substance in red blood cells that carries oxygen. When you do not have enough red blood cells or hemoglobin (are anemic), your body cannot get enough oxygen and your organs may not work properly. As a result, you may feel very tired or have other problems. What are the causes? Common causes of anemia include:  Excessive bleeding. Anemia can be caused by excessive bleeding inside or outside the body, including bleeding from the intestine or from periods in women.  Poor nutrition.  Long-lasting (chronic) kidney, thyroid, and liver disease.  Bone marrow disorders.  Cancer and treatments for cancer.  HIV (human immunodeficiency virus) and AIDS (acquired immunodeficiency syndrome).  Treatments for HIV and AIDS.  Spleen problems.  Blood disorders.  Infections, medicines, and autoimmune disorders that destroy red blood cells. What are the signs or symptoms? Symptoms of this condition include:  Minor weakness.  Dizziness.  Headache.  Feeling heartbeats that are irregular or faster than normal (palpitations).  Shortness of breath, especially with exercise.  Paleness.  Cold sensitivity.  Indigestion.  Nausea.  Difficulty sleeping.  Difficulty concentrating. Symptoms may occur suddenly or develop slowly. If your anemia is mild, you may not have symptoms. How is this diagnosed? This condition is diagnosed based on:  Blood tests.  Your medical history.  A physical exam.  Bone marrow biopsy. Your health care provider may also check your stool (feces) for blood and may do additional testing to look for the cause of your bleeding. You may also have other tests, including:  Imaging tests, such as a CT scan or MRI.  Endoscopy.  Colonoscopy. How is this treated? Treatment for this condition depends on the cause. If you continue to lose a lot of blood, you may  need to be treated at a hospital. Treatment may include:  Taking supplements of iron, vitamin M08, or folic acid.  Taking a hormone medicine (erythropoietin) that can help to stimulate red blood cell growth.  Having a blood transfusion. This may be needed if you lose a lot of blood.  Making changes to your diet.  Having surgery to remove your spleen. Follow these instructions at home:  Take over-the-counter and prescription medicines only as told by your health care provider.  Take supplements only as told by your health care provider.  Follow any diet instructions that you were given.  Keep all follow-up visits as told by your health care provider. This is important. Contact a health care provider if:  You develop new bleeding anywhere in the body. Get help right away if:  You are very weak.  You are short of breath.  You have pain in your abdomen or chest.  You are dizzy or feel faint.  You have trouble concentrating.  You have bloody or black, tarry stools.  You vomit repeatedly or you vomit up blood. Summary  Anemia is a condition in which you do not have enough red blood cells or enough of a substance in your red blood cells that carries oxygen (hemoglobin).  Symptoms may occur suddenly or develop slowly.  If your anemia is mild, you may not have symptoms.  This condition is diagnosed with blood tests as well as a medical history and physical exam. Other tests may be needed.  Treatment for this condition depends on the cause of the anemia. This information is not intended to replace advice given to you by  your health care provider. Make sure you discuss any questions you have with your health care provider. Document Revised: 10/20/2017 Document Reviewed: 12/09/2016 Elsevier Patient Education  [place].

## 2020-06-11 NOTE — Discharge Summary (Signed)
Physician Discharge Summary  Amy Moses AJO:[id number] DOB: 12-15-1970 DOA: 06/10/2020  PCP: Leeanne Rio, MD  Admit date: 06/10/2020 Discharge date: 06/11/2020  Admitted From: Home Disposition: Home  Recommendations for Outpatient Follow-up:  1. Follow up with PCP in 1-2 weeks 2. Follow with nephrologist within 1 week 3. Please obtain BMP/CBC in one week 4. Please follow up with your PCP on the following pending results: Unresulted Labs (From admission, onward) Comment          Start     Ordered   06/11/20 0803  T3, free  Once,   AD        06/11/20 0802   06/10/20 1411  PTH-related peptide  Once,   R        06/10/20 1411   06/10/20 1410  Occult blood card to lab, stool RN will collect  Once,   R       Question:  Specimen to be collected by:  Answer:  RN will collect   06/10/20 1409           Home Health: Yes Equipment/Devices: Rolling walker and 3 in 1 commode  Discharge Condition: Stable CODE STATUS: Full code Diet recommendation: Low-sodium/renal  Subjective: Seen and examined this morning.  Other than feeling generalized weakness, no other specific complaint.  HPI: Amy Moses is a 49 y.o. female with medical history significant of Stage 4-5 CKD (creatinine 4.57) and DM presenting with progressively worsening fatigue, dyspnea on exertion.  She has been having a lot of swelling in her legs.  In April, she went to her PCP and was told her kidney function was low and to keep her nephrologist.  She saw him on 7/1.  She is scheduled to see him again and had labs done yesterday; she was told her blood count was low and she needed to go to the ER.  They started doing labs, CT, and gave her blood.  She is feeling weak, SOB with ambulation.  She was given 1 unit last night for Hgb of 5.5.   They told her she had fluid around her heart.  She just ate a full breakfast.  She had nephrology evaluation last year in April or May - "they weren't great but they are nothing like  they are now."  HD was not needed at that time.    Records reviewed from [facility].  She was sent by nephrology for evaluation due to anemia.  Hgb 5.9, heme negative.  CT chest with moderate pericardial effusion.  Transferred for availability of cardiology/nephrology.    ED Course:  [facility] transfer, per Dr. Cyd Silence:   Patient sent to [facility] ED from local urgent care clinic due to low Hgb, found to be 5.9.  No gross bleeding on exam/history.  Hemoccult negative. Thought to be secondary to progressive renal disease.    2 units PRBC ordered with Lasix after each unit.    CT chest revealed moderate pericardial effusion  with fluid tracking into the pericardial recess with evidence of pulmonary edema.  Chemistry reveals BUN/Cr of 120/5.9 (up from 65/4.57).  COVID negative, high sensitivity troponin 45, BNP 9,483.    [facility] requesting transfer to [facility] for Cardiology and Nephrology consultation and management.    Brief/Interim Summary: Patient was primarily transferred from [facility] to [name] in order to receive services from nephrology and cardiology as there was concern of pericardial effusion and acute on chronic kidney disease.  Upon admission, stat echo was done which  showed very small pericardial effusion and no tamponade.  This was likely secondary to worsening renal function.  Patient's creatinine was slightly elevated than her baseline.  She also had mild metabolic acidosis.  She was already taking sodium bicarbonate 650 mg 3 times daily, this dose was doubled by nephrology as they were consulted as well.  Patient also came in with anemia of chronic disease.  She received 2 units of PRBC transfusion.  Pretransfusion hemoglobin was 5.5 and posttransfusion was 8.0.  After transfusion, patient feels much better but is still weak.  PT OT was consulted.  They recommended rolling walker, 3 in 1 commode and home health.  Discussed with Aeronautical engineer.  Unfortunately, patient is  from [place] and for that reason, home health could not be arranged for this patient.  However patient has her husband and daughter who are available at home and her daughter is confident that they can provide the help that patient needs.  She is being discharged in stable condition.  Nephrology does not think that she needs any urgent hemodialysis.  She is being discharged on double dose of sodium bicarb and will follow with PCP as well as nephrology within a week and we highly recommend that her renal function should be checked at that point in time.  Discharge Diagnoses:  Principal Problem:   Symptomatic anemia Active Problems:   Uncontrolled type 2 diabetes mellitus with chronic kidney disease, with long-term current use of insulin (HCC)   Hyperlipidemia   Essential hypertension, benign   Primary hypothyroidism   Acute on chronic renal failure (HCC)   Acute pericardial effusion   Class 2 obesity due to excess calories with body mass index (BMI) of 38.0 to 38.9 in adult   Metabolic acidosis    Discharge Instructions   Allergies as of 06/11/2020      Reactions   Lisinopril Cough      Medication List    TAKE these medications   amLODipine 10 MG tablet Commonly known as: NORVASC Take 10 mg by mouth daily.   atorvastatin 10 MG tablet Commonly known as: LIPITOR Take 10 mg by mouth daily.   HumaLOG KwikPen 100 UNIT/ML KwikPen Generic drug: insulin lispro Inject 3-15 Units into the skin in the morning, at noon, and at bedtime.   insulin glargine 100 UNIT/ML injection Commonly known as: LANTUS Inject 50 Units into the skin at bedtime.   levothyroxine 75 MCG tablet Commonly known as: SYNTHROID Take 1 tablet (75 mcg total) by mouth daily. What changed:   medication strength  how much to take   metoprolol succinate 50 MG 24 hr tablet Commonly known as: TOPROL-XL Take 50 mg by mouth at bedtime. Take with or immediately following a meal.   sodium bicarbonate 650 MG  tablet Take 2 tablets (1,300 mg total) by mouth 3 (three) times daily. What changed:   how much to take  when to take this   torsemide 100 MG tablet Commonly known as: DEMADEX Take 100 mg by mouth daily.            Durable Medical Equipment  (From admission, onward)         Start     Ordered   06/11/20 1430  For home use only DME 3 n 1  Once        06/11/20 1430   06/11/20 1430  For home use only DME Walker rolling  Once       Question Answer Comment  Walker: With 5  Inch Wheels   Patient needs a walker to treat with the following condition Weakness      06/11/20 1430   06/11/20 1352  For home use only DME Walker rolling  Once       Question Answer Comment  Walker: With 5 Inch Wheels   Patient needs a walker to treat with the following condition Balance problem      06/11/20 1352   06/11/20 1352  For home use only DME Eelevated commode seat  Once        06/11/20 1352          Follow-up Information    Leeanne Rio, MD Follow up.   Specialty: Family Medicine Why: PCP. Contact information: [address] [place] [address] [phone]              Allergies  Allergen Reactions  . Lisinopril Cough    Consultations: Nephrology   Procedures/Studies: ECHOCARDIOGRAM COMPLETE  Result Date: 06/10/2020    ECHOCARDIOGRAM REPORT   Patient Name:   Amy Moses Date of Exam: 06/10/2020 Medical Rec #:  [id number]       Height:       66.0 in Accession #:    [id number]      Weight:       240.3 lb Date of Birth:  1971/05/09      BSA:          2.162 m Patient Age:    49 years        BP:           149/76 mmHg Patient Gender: F               HR:           74 bpm. Exam Location:  Inpatient Procedure: 2D Echo STAT ECHO Indications:    Pericardial effusion 423.9 / I31.3  History:        Patient has no prior history of Echocardiogram examinations.                 Risk Factors:Diabetes and Hypertension. Chronic kidney disease.                 Arrived from  [facility] via [facility]. Dyspnea on exertion,                 but not at rest.  Sonographer:    Darlina Sicilian RDCS Referring Phys: [place]  1. Left ventricular ejection fraction, by estimation, is 55%. The left ventricle has normal function. The left ventricle has no regional wall motion abnormalities. The left ventricular internal cavity size was mildly dilated. There is mild left ventricular hypertrophy. Left ventricular diastolic parameters are consistent with Grade II diastolic dysfunction (pseudonormalization).  2. Right ventricular systolic function is normal. The right ventricular size is normal. There is moderately elevated pulmonary artery systolic pressure. The estimated right ventricular systolic pressure is 10.2 mmHg.  3. Left atrial size was mildly dilated.  4. The mitral valve is normal in structure. Mild mitral valve regurgitation. No evidence of mitral stenosis.  5. The aortic valve is tricuspid. Aortic valve regurgitation is not visualized. No aortic stenosis is present.  6. The inferior vena cava is dilated in size with <50% respiratory variability, suggesting right atrial pressure of 15 mmHg.  7. There is a small circumferential pericardial effusion. The IVC is dilated, but suspect this is due to CHF and not tamponade. No evidence for tamponade. FINDINGS  Left  Ventricle: Left ventricular ejection fraction, by estimation, is 55%. The left ventricle has normal function. The left ventricle has no regional wall motion abnormalities. The left ventricular internal cavity size was mildly dilated. There is mild left ventricular hypertrophy. Left ventricular diastolic parameters are consistent with Grade II diastolic dysfunction (pseudonormalization). Right Ventricle: The right ventricular size is normal. No increase in right ventricular wall thickness. Right ventricular systolic function is normal. There is moderately elevated pulmonary artery systolic pressure. The tricuspid  regurgitant velocity is 3.26 m/s, and with an assumed right atrial pressure of 15 mmHg, the estimated right ventricular systolic pressure is 24.0 mmHg. Left Atrium: Left atrial size was mildly dilated. Right Atrium: Right atrial size was normal in size. Pericardium: There is a small circumferential pericardial effusion. The IVC is dilated, but suspect this is due to CHF and not tamponade. No evidence for tamponade. A small pericardial effusion is present. Mitral Valve: The mitral valve is normal in structure. There is mild calcification of the mitral valve leaflet(s). Mild mitral annular calcification. Mild mitral valve regurgitation. No evidence of mitral valve stenosis. Tricuspid Valve: The tricuspid valve is normal in structure. Tricuspid valve regurgitation is mild. Aortic Valve: The aortic valve is tricuspid. Aortic valve regurgitation is not visualized. No aortic stenosis is present. Pulmonic Valve: The pulmonic valve was normal in structure. Pulmonic valve regurgitation is trivial. Aorta: The aortic root is normal in size and structure. Venous: The inferior vena cava is dilated in size with less than 50% respiratory variability, suggesting right atrial pressure of 15 mmHg. IAS/Shunts: No atrial level shunt detected by color flow Doppler.  LEFT VENTRICLE PLAX 2D LVIDd:         5.60 cm      Diastology LVIDs:         3.80 cm      LV e' lateral:   8.32 cm/s LV PW:         0.90 cm      LV E/e' lateral: 13.9 LV IVS:        0.90 cm      LV e' medial:    6.13 cm/s LVOT diam:     1.80 cm      LV E/e' medial:  18.9 LV SV:         77 LV SV Index:   36 LVOT Area:     2.54 cm  LV Volumes (MOD) LV vol d, MOD A2C: 148.0 ml LV vol d, MOD A4C: 174.0 ml LV vol s, MOD A2C: 83.3 ml LV vol s, MOD A4C: 69.1 ml LV SV MOD A2C:     64.7 ml LV SV MOD A4C:     174.0 ml LV SV MOD BP:      91.0 ml RIGHT VENTRICLE RV S prime:     13.80 cm/s TAPSE (M-mode): 2.6 cm LEFT ATRIUM             Index       RIGHT ATRIUM           Index LA diam:         4.50 cm 2.08 cm/m  RA Area:     14.30 cm LA Vol (A2C):   74.8 ml 34.60 ml/m RA Volume:   33.50 ml  15.49 ml/m LA Vol (A4C):   65.5 ml 30.29 ml/m LA Biplane Vol: 74.7 ml 34.55 ml/m  AORTIC VALVE LVOT Vmax:   136.00 cm/s LVOT Vmean:  88.700 cm/s LVOT VTI:    0.302 m  AORTA Ao Root diam: 2.90 cm MITRAL VALVE                TRICUSPID VALVE MV Area (PHT): 5.27 cm     TR Peak grad:   42.5 mmHg MV Decel Time: 144 msec     TR Vmax:        326.00 cm/s MV E velocity: 116.00 cm/s MV A velocity: 110.00 cm/s  SHUNTS MV E/A ratio:  1.05         Systemic VTI:  0.30 m                             Systemic Diam: 1.80 cm Loralie Champagne MD Electronically signed by Loralie Champagne MD Signature Date/Time: 06/10/2020/12:03:12 PM    Final       Discharge Exam: Vitals:   06/11/20 0502 06/11/20 0901  BP: (!) 144/70 138/65  Pulse: 72 78  Resp: 18 18  Temp: 98.6 F (37 C) 98.4 F (36.9 C)  SpO2: 98% 99%   Vitals:   06/10/20 2027 06/10/20 2139 06/11/20 0502 06/11/20 0901  BP: 135/67 (!) 141/69 (!) 144/70 138/65  Pulse: 71 72 72 78  Resp: 18 (!) 21 18 18   Temp: 98.1 F (36.7 C) 99.2 F (37.3 C) 98.6 F (37 C) 98.4 F (36.9 C)  TempSrc: Oral Oral Oral Oral  SpO2: 98% 99% 98% 99%  Weight:      Height:        General: Pt is alert, awake, not in acute distress Cardiovascular: RRR, S1/S2 +, no rubs, no gallops Respiratory: CTA bilaterally, no wheezing, no rhonchi Abdominal: Soft, NT, ND, bowel sounds + Extremities: no edema, no cyanosis    The results of significant diagnostics from this hospitalization (including imaging, microbiology, ancillary and laboratory) are listed below for reference.     Microbiology: Recent Results (from the past 240 hour(s))  MRSA PCR Screening     Status: None   Collection Time: 06/10/20  5:08 AM   Specimen: Nasal Mucosa; Nasopharyngeal  Result Value Ref Range Status   MRSA by PCR NEGATIVE NEGATIVE Final    Comment:        The GeneXpert MRSA Assay (FDA approved  for NASAL specimens only), is one component of a comprehensive MRSA colonization surveillance program. It is not intended to diagnose MRSA infection nor to guide or monitor treatment for MRSA infections. Performed at [facility], [place] [address], [place], [place] [address]      Labs: BNP (last 3 results) No results for input(s): BNP in the last 8760 hours. Basic Metabolic Panel: Recent Labs  Lab 06/10/20 1154 06/10/20 1410 06/11/20 0336  NA 136  --  137  K 3.8  --  4.2  CL 104  --  106  CO2 16*  --  17*  GLUCOSE 211*  --  108*  BUN 111*  --  114*  CREATININE 5.32*  --  5.40*  CALCIUM 8.4*  --  8.4*  PHOS  --  7.3*  --    Liver Function Tests: Recent Labs  Lab 06/10/20 1154  AST 12*  ALT 14  ALKPHOS 129*  BILITOT 1.4*  PROT 6.4*  ALBUMIN 2.9*   No results for input(s): LIPASE, AMYLASE in the last 168 hours. No results for input(s): AMMONIA in the last 168 hours. CBC: Recent Labs  Lab 06/10/20 0631 06/11/20 0004 06/11/20 0336  WBC 8.1 9.3 8.9  HGB 6.4*  8.1* 8.0*  HCT 21.5* 26.1* 25.9*  MCV 74.9* 76.1* 76.0*  PLT 233 232 225   Cardiac Enzymes: No results for input(s): CKTOTAL, CKMB, CKMBINDEX, TROPONINI in the last 168 hours. BNP: Invalid input(s): POCBNP CBG: Recent Labs  Lab 06/10/20 1109 06/10/20 1617 06/10/20 2028 06/11/20 0646 06/11/20 1130  GLUCAP 198* 128* 140* 73 106*   D-Dimer No results for input(s): DDIMER in the last 72 hours. Hgb A1c Recent Labs    06/10/20 1154  HGBA1C 7.4*   Lipid Profile Recent Labs    06/10/20 0851  CHOL 58  HDL 28*  LDLCALC 19  TRIG 56  CHOLHDL 2.1   Thyroid function studies Recent Labs    06/10/20 1154  TSH 11.206*   Anemia work up Recent Labs    06/10/20 1410  VITAMINB12 794  FOLATE 8.6  FERRITIN 70  TIBC 343  IRON 31  RETICCTPCT 2.0   Urinalysis No results found for: COLORURINE, APPEARANCEUR, [place], [place], GLUCOSEU, [place], BILIRUBINUR, [place], PROTEINUR,  UROBILINOGEN, NITRITE, LEUKOCYTESUR Sepsis Labs Invalid input(s): PROCALCITONIN,  WBC,  LACTICIDVEN Microbiology Recent Results (from the past 240 hour(s))  MRSA PCR Screening     Status: None   Collection Time: 06/10/20  5:08 AM   Specimen: Nasal Mucosa; Nasopharyngeal  Result Value Ref Range Status   MRSA by PCR NEGATIVE NEGATIVE Final    Comment:        The GeneXpert MRSA Assay (FDA approved for NASAL specimens only), is one component of a comprehensive MRSA colonization surveillance program. It is not intended to diagnose MRSA infection nor to guide or monitor treatment for MRSA infections. Performed at [facility], [place] [address] [place], [place] [address]      Time coordinating discharge: Over 30 minutes  SIGNED:   Darliss Cheney, MD  [facility] 06/11/2020, 2:41 PM  If 7PM-7AM, please contact night-coverage [email or web address]

## 2020-06-11 NOTE — Evaluation (Signed)
Physical Therapy Evaluation Patient Details Name: Amy Moses MRN: [id number] DOB: 1971/05/27 Today's Date: 06/11/2020   History of Present Illness  49 year old female presenting with symptomatic anemia and chronic renal failure. Pt has a past medical history of CKD stage V, diabetes mellitus type 2, diabetic retinopathy, hypertension, hypothyroidism who was sent to ER for hemoglobin of 5.5.  Clinical Impression  Pt is a 49 yo female who is in the hospital for the above diagnosis and the impairments listed below. Pt had noted LE weakness and fatigue. Pt requiring min assist in transfers and min guard for ambulation. Required chair follow due to fatigue. Pt would need rolling walker to ensure balance and safety with ambulation at home and 24/7 supervision assistance. Pt was provided with UE and LE strengthening HEP and HHPT recommended but she has no insurance so unsure if she will qualify. Will continue to follow acutely to maximize functional mobility independence and safety.    medbridge access code:  [email or web address]    Follow Up Recommendations Home health PT;Supervision/Assistance - 24 hour    Equipment Recommendations  Rolling walker with 5" wheels;3in1 (PT)    Recommendations for Other Services       Precautions / Restrictions Precautions Precautions: Fall Restrictions Weight Bearing Restrictions: No      Mobility  Bed Mobility   General bed mobility comments: pt was in chair upon arrival in room; pt states that she has increased fatigue during bed mobility  Transfers Overall transfer level: Needs assistance Equipment used: Rolling walker (2 wheeled) Transfers: Sit to/from Stand Sit to Stand: Min assist         General transfer comment: pt required momentum for power up with sit<>stand with min assist for lift assist; pt required UE use for power up with increased time  Ambulation/Gait Ambulation/Gait assistance: Min guard;+2 physical assistance;+2 safety/equipment  (chair follow) Gait Distance (Feet): 50 Feet Assistive device: Rolling walker (2 wheeled) Gait Pattern/deviations: Decreased dorsiflexion - left;Decreased dorsiflexion - right;Decreased step length - right;Step-through pattern;Decreased step length - left;Decreased stride length;Trunk flexed Gait velocity: decreased   General Gait Details: pt had increased step height for foot clearance, pt required recliner chair to be transfered back to room after walking due to fatigue. pt stated that she was at a 7-8/10 difficulty with walking 50 ft  Stairs       Wheelchair Mobility    Modified Rankin (Stroke Patients Only)       Balance Overall balance assessment: Needs assistance Sitting-balance support: Feet supported;No upper extremity supported Sitting balance-Leahy Scale: Fair Sitting balance - Comments: pt was able to sit at edge of chair with feet supported and no UE suppor while gait belt and gown where applied       Pertinent Vitals/Pain Pain Assessment: No/denies pain    Home Living Family/patient expects to be discharged to:: Private residence Living Arrangements: Spouse/significant other;Children Available Help at Discharge: Family;Friend(s);Available 24 hours/day Type of Home: House Home Access: Level entry     Home Layout: One level Home Equipment: Cane - single point;Wheelchair - Sport and exercise psychologist Comments: pt utilizes w/c with longer duration activities, pt utilizes cane when transfering or ambulating short distances    Prior Function Level of Independence: Independent with assistive device(s)         Comments: pt requires cane for short distance ambulation and manual w/c for cooking in kitchen. Did not require family assistance until her LE became weak     Hand Dominance        Extremity/Trunk  Assessment   Upper Extremity Assessment Upper Extremity Assessment: Defer to OT evaluation    Lower Extremity Assessment Lower Extremity Assessment:  Generalized weakness;LLE deficits/detail;RLE deficits/detail RLE Deficits / Details: MMT: PF/DF 0/5, Knee flexion, Knee Extension, Hip flexion = +4/5 LLE Deficits / Details: MMT: PF/DF 0/5, Knee flexion, Knee Extension, Hip flexion +4/5    Cervical / Trunk Assessment Cervical / Trunk Assessment: Normal  Communication   Communication: No difficulties  Cognition Arousal/Alertness: Awake/alert Behavior During Therapy: WFL for tasks assessed/performed Overall Cognitive Status: Within Functional Limits for tasks assessed                   General Comments      Exercises Other Exercises Other Exercises: weight shifting in standing using RW with min assistx 5 each Other Exercises: standing marches in RW with min assist x 4 ea through partial range of motion, pt fatigued after 4 reps   Assessment/Plan    PT Assessment Patient needs continued PT services  PT Problem List Decreased strength;Decreased range of motion;Decreased activity tolerance;Decreased balance;Decreased mobility;Decreased coordination;Decreased knowledge of use of DME;Decreased knowledge of precautions       PT Treatment Interventions DME instruction;Gait training;Functional mobility training;Stair training;Therapeutic activities;Therapeutic exercise;Balance training;Neuromuscular re-education;Patient/family education;Wheelchair mobility training;Manual techniques    PT Goals (Current goals can be found in the Care Plan section)  Acute Rehab PT Goals Patient Stated Goal: return home PT Goal Formulation: With patient Time For Goal Achievement: 06/25/20 Potential to Achieve Goals: Good    Frequency Min 3X/week   Barriers to discharge        Co-evaluation               AM-PAC PT "6 Clicks" Mobility  Outcome Measure Help needed turning from your back to your side while in a flat bed without using bedrails?: A Little Help needed moving from lying on your back to sitting on the side of a flat bed  without using bedrails?: A Little Help needed moving to and from a bed to a chair (including a wheelchair)?: A Little Help needed standing up from a chair using your arms (e.g., wheelchair or bedside chair)?: A Little Help needed to walk in hospital room?: A Lot Help needed climbing 3-5 steps with a railing? : A Lot 6 Click Score: 16    End of Session Equipment Utilized During Treatment: Gait belt Activity Tolerance: Patient limited by fatigue;Patient tolerated treatment well;Treatment limited secondary to medical complications (Comment) Patient left: in chair;with call Calisa Luckenbaugh/phone within reach;with family/visitor present Nurse Communication: Mobility status PT Visit Diagnosis: Unsteadiness on feet (R26.81);Other abnormalities of gait and mobility (R26.89);Muscle weakness (generalized) (M62.81);Difficulty in walking, not elsewhere classified (R26.2)    Time: 1300-1330 PT Time Calculation (min) (ACUTE ONLY): 30 min   Charges:   PT Evaluation $PT Eval Moderate Complexity: 1 Mod PT Treatments $Gait Training: 8-22 mins        Gloriann Loan, SPT  Acute Rehabilitation Services  Office: [phone]  06/11/2020, 3:16 PM

## 2020-06-11 NOTE — TOC Transition Note (Signed)
Transition of Care [name]) - CM/SW Discharge Note   Patient Details  Name: Amy Moses MRN: [id number] Date of Birth: 1971-11-10  Transition of Care [facility]) CM/SW Contact:  Sharin Mons, RN Phone Number: [phone] 06/11/2020, 2:57 PM   Clinical Narrative:   Admitted with symptomatic anemia.  Pt will transition to home with family. Pt without insurance. Order noted for HHPT. Pt lives in [place]. Unable to assist with charity care in [place]. NCM offered outpatient services , however, pt stated daughter works and has no transportation. Daughter stated pt will have 24/7 supervision and assist provided by family once d/c. DME rolling walker and 3 IN 1/BSC will be provided to pt prior to d/c by [facility] care. Rx meds will be delivered to bedside. Daughter to provide transportation to home.    Final next level of care: Home w Home Health Services Barriers to Discharge: No Barriers Identified   Patient Goals and CMS Choice Patient states their goals for this hospitalization and ongoing recovery are:: to go home   Choice offered to / list presented to : Patient  Discharge Placement                       Discharge Plan and Services                DME Arranged: 3-N-1, Walker rolling [name] care) DME Agency: [employer] Date DME Agency Contacted: 06/11/20 Time DME Agency Contacted: [phone] Representative spoke with at DME Agency: [place]: PT          Social Determinants of Health ([place]) Interventions     Readmission Risk Interventions No flowsheet data found.

## 2020-06-12 LAB — T3, FREE: T3, Free: 1.7 pg/mL — ABNORMAL LOW (ref 2.0–4.4)

## 2020-06-17 LAB — PTH-RELATED PEPTIDE: PTH-related peptide: <2 pmol/L

## 2020-07-13 ENCOUNTER — Other Ambulatory Visit: Payer: Self-pay

## 2020-07-13 DIAGNOSIS — N184 Chronic kidney disease, stage 4 (severe): Secondary | ICD-10-CM

## 2020-07-23 ENCOUNTER — Encounter: Payer: Self-pay | Admitting: Vascular Surgery

## 2020-07-23 ENCOUNTER — Ambulatory Visit (HOSPITAL_COMMUNITY)
Admission: RE | Admit: 2020-07-23 | Discharge: 2020-07-23 | Disposition: A | Discharge status: Home / Self Care | Payer: Medicaid - Out of State | Source: Ambulatory Visit | Attending: Vascular Surgery | Admitting: Vascular Surgery

## 2020-07-23 ENCOUNTER — Ambulatory Visit (INDEPENDENT_AMBULATORY_CARE_PROVIDER_SITE_OTHER): Payer: Medicaid - Out of State | Admitting: Vascular Surgery

## 2020-07-23 ENCOUNTER — Ambulatory Visit (INDEPENDENT_AMBULATORY_CARE_PROVIDER_SITE_OTHER)
Admission: RE | Admit: 2020-07-23 | Discharge: 2020-07-23 | Disposition: A | Discharge status: Home / Self Care | Payer: Medicaid - Out of State | Source: Ambulatory Visit | Attending: Vascular Surgery | Admitting: Vascular Surgery

## 2020-07-23 ENCOUNTER — Other Ambulatory Visit: Payer: Self-pay

## 2020-07-23 VITALS — BP 161/81 | HR 76 | Temp 97.9°F | Resp 20

## 2020-07-23 DIAGNOSIS — N185 Chronic kidney disease, stage 5: Secondary | ICD-10-CM | POA: Diagnosis not present

## 2020-07-23 DIAGNOSIS — N184 Chronic kidney disease, stage 4 (severe): Secondary | ICD-10-CM

## 2020-07-23 NOTE — H&P (View-Only) (Signed)
Referring Physician: Dr Theador Hawthorne  Patient name: Amy Moses MRN: [id number] DOB: 01-Mar-1971 Sex: female  REASON FOR CONSULT: Hemodialysis access  HPI: Amy Moses is a 49 y.o. female, who is currently CKD 5 referred for hemodialysis access.  She has had no prior access procedures.  She is right-handed.  She is not on any anticoagulation.  She has not had a prior central venous procedure.  He currently has no numbness or tingling in her hands.  Other medical problems include diabetes hyperlipidemia hypertension all of which have been stable.  Past Medical History:  Diagnosis Date  . 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 Diabetes mellitus, type II ([place])   . Dyslipidemia   . Glaucoma   . Hypertension   . Hypothyroidism (acquired)   . Kidney disease    Past Surgical History:  Procedure Laterality Date  . ANKLE FRACTURE SURGERY    . CESAREAN SECTION    . CHOLECYSTECTOMY    . EYE SURGERY     Vatrectomy  . TOE SURGERY      Family History  Problem Relation Age of Onset  . Heart disease Mother   . Diabetes Mother   . Kidney disease Mother   . Diabetes Father   . Heart disease Father   . Diabetes Brother     SOCIAL HISTORY: Social History   Socioeconomic History  . Marital status: Single    Spouse name: Not on file  . Number of children: Not on file  . Years of education: Not on file  . Highest education level: Not on file  Occupational History  . Not on file  Tobacco Use  . Smoking status: Never Smoker  . Smokeless tobacco: Never Used  Vaping Use  . Vaping Use: Never used  Substance and Sexual Activity  . Alcohol use: No  . Drug use: No  . Sexual activity: Yes    Birth control/protection: Condom  Other Topics Concern  . Not on file  Social History Narrative  . Not on file   Social Determinants of Health   Financial Resource Strain:   . Difficulty of Paying Living Expenses: Not on file  Food  Insecurity:   . Worried About Charity fundraiser in the Last Year: Not on file  . Ran Out of Food in the Last Year: Not on file  Transportation Needs:   . Lack of Transportation (Medical): Not on file  . Lack of Transportation (Non-Medical): Not on file  Physical Activity:   . Days of Exercise per Week: Not on file  . Minutes of Exercise per Session: Not on file  Stress:   . Feeling of Stress : Not on file  Social Connections:   . Frequency of Communication with Friends and Family: Not on file  . Frequency of Social Gatherings with Friends and Family: Not on file  . Attends Religious Services: Not on file  . Active Member of Clubs or Organizations: Not on file  . Attends Archivist Meetings: Not on file  . Marital Status: Not on file  Intimate Partner Violence:   . Fear of Current or Ex-Partner: Not on file  . Emotionally Abused: Not on file  . Physically Abused: Not on file  . Sexually Abused: Not on file    Allergies  Allergen Reactions  . Lisinopril Cough    Current Outpatient Medications  Medication Sig Dispense Refill  . amLODipine (NORVASC) 10  MG tablet Take 10 mg by mouth daily.    Marland Kitchen atorvastatin (LIPITOR) 10 MG tablet Take 10 mg by mouth daily.    Marland Kitchen epoetin alfa (EPOGEN) 4000 UNIT/ML injection Inject into the skin.    Marland Kitchen HUMALOG KWIKPEN 100 UNIT/ML KwikPen Inject 3-15 Units into the skin in the morning, at noon, and at bedtime.    . insulin glargine (LANTUS) 100 UNIT/ML injection Inject 50 Units into the skin at bedtime.    . metoprolol succinate (TOPROL-XL) 50 MG 24 hr tablet Take 50 mg by mouth at bedtime. Take with or immediately following a meal.     . torsemide (DEMADEX) 100 MG tablet Take 100 mg by mouth daily.    Marland Kitchen levothyroxine (SYNTHROID) 75 MCG tablet Take 1 tablet (75 mcg total) by mouth daily. 30 tablet 0   No current facility-administered medications for this visit.    ROS:   General:  No weight loss, Fever, chills  HEENT: No recent  headaches, no nasal bleeding, no visual changes, no sore throat  Neurologic: No dizziness, blackouts, seizures. No recent symptoms of stroke or mini- stroke. No recent episodes of slurred speech, or temporary blindness.  Cardiac: No recent episodes of chest pain/pressure, no shortness of breath at rest.  No shortness of breath with exertion.  Denies history of atrial fibrillation or irregular heartbeat  Vascular: No history of rest pain in feet.  No history of claudication.  No history of non-healing ulcer, No history of DVT   Pulmonary: No home oxygen, no productive cough, no hemoptysis,  No asthma or wheezing  Musculoskeletal:  [ ]  Arthritis, [ ]  Low back pain,  [ ]  Joint pain  Hematologic:No history of hypercoagulable state.  No history of easy bleeding.  No history of anemia  Gastrointestinal: No hematochezia or melena,  No gastroesophageal reflux, no trouble swallowing  Urinary: [X]  chronic Kidney disease, [ ]  on HD - [ ]  MWF or [ ]  TTHS, [ ]  Burning with urination, [ ]  Frequent urination, [ ]  Difficulty urinating;   Skin: No rashes  Psychological: No history of anxiety,  No history of depression   Physical Examination  Vitals:   07/23/20 0945  BP: (!) 161/81  Pulse: 76  Resp: 20  Temp: 97.9 F (36.6 C)  SpO2: 99%    There is no height or weight on file to calculate BMI.  General:  Alert and oriented, no acute distress HEENT: Right eye blind Neck: No JVD Cardiac: Regular Rate and Rhythm Skin: No rash Extremity Pulses:  2+ right absent left radial, 2+ bilateral brachial Musculoskeletal: No deformity or edema  Neurologic: Upper and lower extremity motor 5/5 and symmetric  DATA:  Patient had a vein mapping ultrasound today which shows the upper arm cephalic is 3 to 4 mm in diameter.  Basilic vein was just under 3 mm bilaterally in the upper arm.  Cephalic vein in the forearm was small.  She also had an upper extremity arterial duplex exam which showed calcification of  the radial and ulnar artery bilaterally.  Otherwise normal anatomic configuration.  Viewed interpreted both the studies.  ASSESSMENT: Patient needs long-term hemodialysis access.  We will plan for a left brachiocephalic AV fistula scheduled for August 18, 2020 Tuesday.  Risk benefits possible complications of procedure details were discussed with patient today including but not limited to bleeding infection ischemic steal nonmaturation of the fistula possible other procedures.  She understands and agrees to proceed.  PLAN: See above  Ruta Hinds, MD  [facility] Office: [phone]

## 2020-07-23 NOTE — Progress Notes (Signed)
Referring Physician: Dr Theador Hawthorne  Patient name: Amy Moses MRN: [id number] DOB: 14-Aug-1971 Sex: female  REASON FOR CONSULT: Hemodialysis access  HPI: Amy Moses is a 49 y.o. female, who is currently CKD 5 referred for hemodialysis access.  She has had no prior access procedures.  She is right-handed.  She is not on any anticoagulation.  She has not had a prior central venous procedure.  He currently has no numbness or tingling in her hands.  Other medical problems include diabetes hyperlipidemia hypertension all of which have been stable.  Past Medical History:  Diagnosis Date  . 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 Diabetes mellitus, type II ([place])   . Dyslipidemia   . Glaucoma   . Hypertension   . Hypothyroidism (acquired)   . Kidney disease    Past Surgical History:  Procedure Laterality Date  . ANKLE FRACTURE SURGERY    . CESAREAN SECTION    . CHOLECYSTECTOMY    . EYE SURGERY     Vatrectomy  . TOE SURGERY      Family History  Problem Relation Age of Onset  . Heart disease Mother   . Diabetes Mother   . Kidney disease Mother   . Diabetes Father   . Heart disease Father   . Diabetes Brother     SOCIAL HISTORY: Social History   Socioeconomic History  . Marital status: Single    Spouse name: Not on file  . Number of children: Not on file  . Years of education: Not on file  . Highest education level: Not on file  Occupational History  . Not on file  Tobacco Use  . Smoking status: Never Smoker  . Smokeless tobacco: Never Used  Vaping Use  . Vaping Use: Never used  Substance and Sexual Activity  . Alcohol use: No  . Drug use: No  . Sexual activity: Yes    Birth control/protection: Condom  Other Topics Concern  . Not on file  Social History Narrative  . Not on file   Social Determinants of Health   Financial Resource Strain:   . Difficulty of Paying Living Expenses: Not on file  Food  Insecurity:   . Worried About Charity fundraiser in the Last Year: Not on file  . Ran Out of Food in the Last Year: Not on file  Transportation Needs:   . Lack of Transportation (Medical): Not on file  . Lack of Transportation (Non-Medical): Not on file  Physical Activity:   . Days of Exercise per Week: Not on file  . Minutes of Exercise per Session: Not on file  Stress:   . Feeling of Stress : Not on file  Social Connections:   . Frequency of Communication with Friends and Family: Not on file  . Frequency of Social Gatherings with Friends and Family: Not on file  . Attends Religious Services: Not on file  . Active Member of Clubs or Organizations: Not on file  . Attends Archivist Meetings: Not on file  . Marital Status: Not on file  Intimate Partner Violence:   . Fear of Current or Ex-Partner: Not on file  . Emotionally Abused: Not on file  . Physically Abused: Not on file  . Sexually Abused: Not on file    Allergies  Allergen Reactions  . Lisinopril Cough    Current Outpatient Medications  Medication Sig Dispense Refill  . amLODipine (NORVASC) 10  MG tablet Take 10 mg by mouth daily.    Marland Kitchen atorvastatin (LIPITOR) 10 MG tablet Take 10 mg by mouth daily.    Marland Kitchen epoetin alfa (EPOGEN) 4000 UNIT/ML injection Inject into the skin.    Marland Kitchen HUMALOG KWIKPEN 100 UNIT/ML KwikPen Inject 3-15 Units into the skin in the morning, at noon, and at bedtime.    . insulin glargine (LANTUS) 100 UNIT/ML injection Inject 50 Units into the skin at bedtime.    . metoprolol succinate (TOPROL-XL) 50 MG 24 hr tablet Take 50 mg by mouth at bedtime. Take with or immediately following a meal.     . torsemide (DEMADEX) 100 MG tablet Take 100 mg by mouth daily.    Marland Kitchen levothyroxine (SYNTHROID) 75 MCG tablet Take 1 tablet (75 mcg total) by mouth daily. 30 tablet 0   No current facility-administered medications for this visit.    ROS:   General:  No weight loss, Fever, chills  HEENT: No recent  headaches, no nasal bleeding, no visual changes, no sore throat  Neurologic: No dizziness, blackouts, seizures. No recent symptoms of stroke or mini- stroke. No recent episodes of slurred speech, or temporary blindness.  Cardiac: No recent episodes of chest pain/pressure, no shortness of breath at rest.  No shortness of breath with exertion.  Denies history of atrial fibrillation or irregular heartbeat  Vascular: No history of rest pain in feet.  No history of claudication.  No history of non-healing ulcer, No history of DVT   Pulmonary: No home oxygen, no productive cough, no hemoptysis,  No asthma or wheezing  Musculoskeletal:  [ ]  Arthritis, [ ]  Low back pain,  [ ]  Joint pain  Hematologic:No history of hypercoagulable state.  No history of easy bleeding.  No history of anemia  Gastrointestinal: No hematochezia or melena,  No gastroesophageal reflux, no trouble swallowing  Urinary: [X]  chronic Kidney disease, [ ]  on HD - [ ]  MWF or [ ]  TTHS, [ ]  Burning with urination, [ ]  Frequent urination, [ ]  Difficulty urinating;   Skin: No rashes  Psychological: No history of anxiety,  No history of depression   Physical Examination  Vitals:   07/23/20 0945  BP: (!) 161/81  Pulse: 76  Resp: 20  Temp: 97.9 F (36.6 C)  SpO2: 99%    There is no height or weight on file to calculate BMI.  General:  Alert and oriented, no acute distress HEENT: Right eye blind Neck: No JVD Cardiac: Regular Rate and Rhythm Skin: No rash Extremity Pulses:  2+ right absent left radial, 2+ bilateral brachial Musculoskeletal: No deformity or edema  Neurologic: Upper and lower extremity motor 5/5 and symmetric  DATA:  Patient had a vein mapping ultrasound today which shows the upper arm cephalic is 3 to 4 mm in diameter.  Basilic vein was just under 3 mm bilaterally in the upper arm.  Cephalic vein in the forearm was small.  She also had an upper extremity arterial duplex exam which showed calcification of  the radial and ulnar artery bilaterally.  Otherwise normal anatomic configuration.  Viewed interpreted both the studies.  ASSESSMENT: Patient needs long-term hemodialysis access.  We will plan for a left brachiocephalic AV fistula scheduled for August 18, 2020 Tuesday.  Risk benefits possible complications of procedure details were discussed with patient today including but not limited to bleeding infection ischemic steal nonmaturation of the fistula possible other procedures.  She understands and agrees to proceed.  PLAN: See above  Ruta Hinds, MD  [facility] Office: [phone]

## 2020-08-14 ENCOUNTER — Other Ambulatory Visit: Payer: Self-pay

## 2020-08-14 ENCOUNTER — Encounter (HOSPITAL_COMMUNITY): Payer: Self-pay | Admitting: Vascular Surgery

## 2020-08-14 NOTE — Progress Notes (Signed)
Spoke with pt for pre-op call. Pt denies cardiac history. Pt is treated for HTN and Diabetes. Last A1C was 7.4 on 7/21/2. Pt states her fasting blood sugar is usually between 120-125. Instructed pt to take 1/2 of her regular dose of Lantus Insulin Monday at bedtime, she will take 25 units. Instructed pt not to take her Humalog insulin the morning of surgery. Instructed pt to check her blood sugar Tuesday AM when she gets up and every 2 hours until she leaves for the hospital. If blood sugar is 70 or below, treat with 1/2 cup of clear juice (apple or cranberry) and recheck blood sugar 15 minutes after drinking juice. If blood sugar continues to be 70 or below, call the Short Stay department and ask to speak to a nurse. Pt voiced understanding.  Covid test is scheduled for 08/15/20. Pt instructed to quarantine after the test is done until she comes to the hospital on Tuesday. She again voiced understanding.

## 2020-08-15 ENCOUNTER — Other Ambulatory Visit (HOSPITAL_COMMUNITY)
Admission: RE | Admit: 2020-08-15 | Discharge: 2020-08-15 | Disposition: A | Discharge status: Home / Self Care | Payer: Medicaid - Out of State | Source: Ambulatory Visit | Attending: Vascular Surgery | Admitting: Vascular Surgery

## 2020-08-15 DIAGNOSIS — Z20822 Contact with and (suspected) exposure to covid-19: Secondary | ICD-10-CM | POA: Diagnosis not present

## 2020-08-15 DIAGNOSIS — Z01812 Encounter for preprocedural laboratory examination: Secondary | ICD-10-CM | POA: Insufficient documentation

## 2020-08-15 LAB — SARS CORONAVIRUS 2 (TAT 6-24 HRS): SARS Coronavirus 2: NEGATIVE

## 2020-08-18 ENCOUNTER — Encounter (HOSPITAL_COMMUNITY)
Admission: RE | Disposition: A | Discharge status: Home / Self Care | Payer: Self-pay | Source: Home / Self Care | Attending: Vascular Surgery

## 2020-08-18 ENCOUNTER — Ambulatory Visit (HOSPITAL_COMMUNITY)
Admission: RE | Admit: 2020-08-18 | Discharge: 2020-08-18 | Disposition: A | Discharge status: Home / Self Care | Payer: Medicaid - Out of State | Attending: Vascular Surgery | Admitting: Vascular Surgery

## 2020-08-18 ENCOUNTER — Encounter (HOSPITAL_COMMUNITY): Payer: Self-pay | Admitting: Vascular Surgery

## 2020-08-18 ENCOUNTER — Ambulatory Visit (HOSPITAL_COMMUNITY): Payer: Medicaid - Out of State | Admitting: Physician Assistant

## 2020-08-18 ENCOUNTER — Other Ambulatory Visit: Payer: Self-pay

## 2020-08-18 DIAGNOSIS — I120 Hypertensive chronic kidney disease with stage 5 chronic kidney disease or end stage renal disease: Secondary | ICD-10-CM | POA: Insufficient documentation

## 2020-08-18 DIAGNOSIS — E1139 Type 2 diabetes mellitus with other diabetic ophthalmic complication: Secondary | ICD-10-CM | POA: Insufficient documentation

## 2020-08-18 DIAGNOSIS — N185 Chronic kidney disease, stage 5: Secondary | ICD-10-CM | POA: Insufficient documentation

## 2020-08-18 DIAGNOSIS — E039 Hypothyroidism, unspecified: Secondary | ICD-10-CM | POA: Diagnosis not present

## 2020-08-18 DIAGNOSIS — Z9049 Acquired absence of other specified parts of digestive tract: Secondary | ICD-10-CM | POA: Diagnosis not present

## 2020-08-18 DIAGNOSIS — E1122 Type 2 diabetes mellitus with diabetic chronic kidney disease: Secondary | ICD-10-CM | POA: Insufficient documentation

## 2020-08-18 DIAGNOSIS — Z841 Family history of disorders of kidney and ureter: Secondary | ICD-10-CM | POA: Diagnosis not present

## 2020-08-18 DIAGNOSIS — E785 Hyperlipidemia, unspecified: Secondary | ICD-10-CM | POA: Insufficient documentation

## 2020-08-18 DIAGNOSIS — H5461 Unqualified visual loss, right eye, normal vision left eye: Secondary | ICD-10-CM | POA: Diagnosis not present

## 2020-08-18 DIAGNOSIS — Z833 Family history of diabetes mellitus: Secondary | ICD-10-CM | POA: Diagnosis not present

## 2020-08-18 DIAGNOSIS — H42 Glaucoma in diseases classified elsewhere: Secondary | ICD-10-CM | POA: Insufficient documentation

## 2020-08-18 DIAGNOSIS — Z8249 Family history of ischemic heart disease and other diseases of the circulatory system: Secondary | ICD-10-CM | POA: Diagnosis not present

## 2020-08-18 HISTORY — PX: AV FISTULA PLACEMENT: SHX1204

## 2020-08-18 HISTORY — DX: Pneumonia, unspecified organism: J18.9

## 2020-08-18 HISTORY — DX: Anemia, unspecified: D64.9

## 2020-08-18 LAB — POCT I-STAT, CHEM 8
BUN: 96 mg/dL — ABNORMAL HIGH (ref 6–20)
Calcium, Ion: 1.14 mmol/L — ABNORMAL LOW (ref 1.15–1.40)
Chloride: 102 mmol/L (ref 98–111)
Creatinine, Ser: 5.8 mg/dL — ABNORMAL HIGH (ref 0.44–1.00)
Glucose, Bld: 114 mg/dL — ABNORMAL HIGH (ref 70–99)
HCT: 30 % — ABNORMAL LOW (ref 36.0–46.0)
Hemoglobin: 10.2 g/dL — ABNORMAL LOW (ref 12.0–15.0)
Potassium: 4.2 mmol/L (ref 3.5–5.1)
Sodium: 139 mmol/L (ref 135–145)
TCO2: 25 mmol/L (ref 22–32)

## 2020-08-18 LAB — GLUCOSE, CAPILLARY
Glucose-Capillary: 109 mg/dL — ABNORMAL HIGH (ref 70–99)
Glucose-Capillary: 97 mg/dL (ref 70–99)
Glucose-Capillary: 115 mg/dL — ABNORMAL HIGH (ref 70–99)

## 2020-08-18 SURGERY — ARTERIOVENOUS (AV) FISTULA CREATION
Anesthesia: Monitor Anesthesia Care | Laterality: Left

## 2020-08-18 MED ORDER — LIDOCAINE HCL (PF) 1 % IJ SOLN
INTRAMUSCULAR | Status: AC
  Filled 2020-08-18: qty 30

## 2020-08-18 MED ORDER — HYDROCODONE-ACETAMINOPHEN 5-325 MG PO TABS
1.0000 | ORAL_TABLET | Freq: Four times a day (QID) | ORAL | 0 refills | Status: DC | PRN
Start: 2020-08-18 — End: 2020-11-19

## 2020-08-18 MED ORDER — SODIUM CHLORIDE 0.9 % IV SOLN
INTRAVENOUS | Status: AC
  Filled 2020-08-18: qty 1.2

## 2020-08-18 MED ORDER — MIDAZOLAM HCL 2 MG/2ML IJ SOLN
INTRAMUSCULAR | Status: AC
  Filled 2020-08-18: qty 2

## 2020-08-18 MED ORDER — CEFAZOLIN SODIUM-DEXTROSE 2-4 GM/100ML-% IV SOLN
INTRAVENOUS | Status: AC
  Filled 2020-08-18: qty 100

## 2020-08-18 MED ORDER — HEPARIN SODIUM (PORCINE) 1000 UNIT/ML IJ SOLN
INTRAMUSCULAR | Status: DC | PRN
  Administered 2020-08-18: 5000 [IU] via INTRAVENOUS

## 2020-08-18 MED ORDER — FENTANYL CITRATE (PF) 250 MCG/5ML IJ SOLN
INTRAMUSCULAR | Status: AC
  Filled 2020-08-18: qty 5

## 2020-08-18 MED ORDER — MIDAZOLAM HCL 5 MG/5ML IJ SOLN
INTRAMUSCULAR | Status: DC | PRN
  Administered 2020-08-18: 2 mg via INTRAVENOUS

## 2020-08-18 MED ORDER — LIDOCAINE HCL (PF) 1 % IJ SOLN
INTRAMUSCULAR | Status: DC | PRN
  Administered 2020-08-18: 10 mL

## 2020-08-18 MED ORDER — 0.9 % SODIUM CHLORIDE (POUR BTL) OPTIME
TOPICAL | Status: DC | PRN
  Administered 2020-08-18: 1000 mL

## 2020-08-18 MED ORDER — CHLORHEXIDINE GLUCONATE 4 % EX LIQD: 60.0000 mL | Freq: Once | CUTANEOUS | Status: DC

## 2020-08-18 MED ORDER — PROPOFOL 500 MG/50ML IV EMUL
INTRAVENOUS | Status: DC | PRN
  Administered 2020-08-18: 70 ug/kg/min via INTRAVENOUS

## 2020-08-18 MED ORDER — LIDOCAINE 2% (20 MG/ML) 5 ML SYRINGE
INTRAMUSCULAR | Status: DC | PRN
  Administered 2020-08-18: 40 mg via INTRAVENOUS

## 2020-08-18 MED ORDER — FENTANYL CITRATE (PF) 250 MCG/5ML IJ SOLN
INTRAMUSCULAR | Status: DC | PRN
Start: 2020-08-18 — End: 2020-08-18
  Administered 2020-08-18 (×2): 50 ug via INTRAVENOUS

## 2020-08-18 MED ORDER — DEXAMETHASONE SODIUM PHOSPHATE 10 MG/ML IJ SOLN
INTRAMUSCULAR | Status: AC
  Filled 2020-08-18: qty 1

## 2020-08-18 MED ORDER — CHLORHEXIDINE GLUCONATE 0.12 % MT SOLN: 15.0000 mL | Freq: Once | OROMUCOSAL | Status: AC

## 2020-08-18 MED ORDER — SODIUM CHLORIDE 0.9 % IV SOLN: INTRAVENOUS | Status: DC

## 2020-08-18 MED ORDER — CHLORHEXIDINE GLUCONATE 0.12 % MT SOLN
OROMUCOSAL | Status: AC
  Administered 2020-08-18: 15 mL via OROMUCOSAL
  Filled 2020-08-18: qty 15

## 2020-08-18 MED ORDER — DEXAMETHASONE SODIUM PHOSPHATE 10 MG/ML IJ SOLN
INTRAMUSCULAR | Status: DC | PRN
  Administered 2020-08-18: 5 mg via INTRAVENOUS

## 2020-08-18 MED ORDER — HEPARIN SODIUM (PORCINE) 1000 UNIT/ML IJ SOLN
INTRAMUSCULAR | Status: AC
  Filled 2020-08-18: qty 1

## 2020-08-18 MED ORDER — ORAL CARE MOUTH RINSE: 15.0000 mL | Freq: Once | OROMUCOSAL | Status: AC

## 2020-08-18 MED ORDER — CEFAZOLIN SODIUM-DEXTROSE 2-4 GM/100ML-% IV SOLN
2.0000 g | INTRAVENOUS | Status: AC
  Administered 2020-08-18: 2 g via INTRAVENOUS

## 2020-08-18 MED ORDER — SODIUM CHLORIDE 0.9 % IV SOLN
INTRAVENOUS | Status: DC | PRN
  Administered 2020-08-18: 500 mL

## 2020-08-18 SURGICAL SUPPLY — 35 items
ADH SKN CLS APL DERMABOND .7 (GAUZE/BANDAGES/DRESSINGS) ×1
ADH SKN CLS LQ APL DERMABOND (GAUZE/BANDAGES/DRESSINGS) ×1
ARMBAND PINK RESTRICT EXTREMIT (MISCELLANEOUS) ×4 IMPLANT
CANISTER SUCT 3000ML PPV (MISCELLANEOUS) ×2 IMPLANT
CANNULA VESSEL 3MM 2 BLNT TIP (CANNULA) ×2 IMPLANT
CLIP VESOCCLUDE MED 6/CT (CLIP) ×2 IMPLANT
CLIP VESOCCLUDE SM WIDE 6/CT (CLIP) ×2 IMPLANT
COVER PROBE W GEL 5X96 (DRAPES) IMPLANT
COVER WAND RF STERILE (DRAPES) ×2 IMPLANT
DECANTER SPIKE VIAL GLASS SM (MISCELLANEOUS) ×2 IMPLANT
DERMABOND ADHESIVE PROPEN (GAUZE/BANDAGES/DRESSINGS) ×1
DERMABOND ADVANCED (GAUZE/BANDAGES/DRESSINGS) ×1
DERMABOND ADVANCED .7 DNX12 (GAUZE/BANDAGES/DRESSINGS) ×1 IMPLANT
DERMABOND ADVANCED .7 DNX6 (GAUZE/BANDAGES/DRESSINGS) ×1 IMPLANT
DRAIN PENROSE 1/4X12 LTX STRL (WOUND CARE) ×2 IMPLANT
ELECT REM PT RETURN 9FT ADLT (ELECTROSURGICAL) ×2
ELECTRODE REM PT RTRN 9FT ADLT (ELECTROSURGICAL) ×1 IMPLANT
GLOVE BIO SURGEON STRL SZ7.5 (GLOVE) ×2 IMPLANT
GOWN STRL REUS W/ TWL LRG LVL3 (GOWN DISPOSABLE) ×3 IMPLANT
GOWN STRL REUS W/TWL LRG LVL3 (GOWN DISPOSABLE) ×6
KIT BASIN OR (CUSTOM PROCEDURE TRAY) ×2 IMPLANT
KIT TURNOVER KIT B (KITS) ×2 IMPLANT
LOOP VESSEL MINI RED (MISCELLANEOUS) IMPLANT
NS IRRIG 1000ML POUR BTL (IV SOLUTION) ×2 IMPLANT
PACK CV ACCESS (CUSTOM PROCEDURE TRAY) ×2 IMPLANT
PAD ARMBOARD 7.5X6 YLW CONV (MISCELLANEOUS) ×4 IMPLANT
SPONGE SURGIFOAM ABS GEL 100 (HEMOSTASIS) IMPLANT
SUT PROLENE 6 0 CC (SUTURE) ×2 IMPLANT
SUT PROLENE 7 0 BV 1 (SUTURE) ×2 IMPLANT
SUT VIC AB 3-0 SH 27 (SUTURE) ×2
SUT VIC AB 3-0 SH 27X BRD (SUTURE) ×1 IMPLANT
SUT VICRYL 4-0 PS2 18IN ABS (SUTURE) ×2 IMPLANT
TOWEL GREEN STERILE (TOWEL DISPOSABLE) ×2 IMPLANT
UNDERPAD 30X36 HEAVY ABSORB (UNDERPADS AND DIAPERS) ×2 IMPLANT
WATER STERILE IRR 1000ML POUR (IV SOLUTION) ×2 IMPLANT

## 2020-08-18 NOTE — Transfer of Care (Signed)
Immediate Anesthesia Transfer of Care Note  Patient: Amy Moses, Amy Moses  Procedure(s) Performed: LEFT ARM BRACHIOCEPHALIC ARTERIOVENOUS (AV) FISTULA CREATION (Left )  Patient Location: PACU  Anesthesia Type:MAC  Level of Consciousness: awake, alert , oriented and patient cooperative  Airway & Oxygen Therapy: Patient Spontanous Breathing and Patient connected to face mask oxygen  Post-op Assessment: Report given to RN, Post -op Vital signs reviewed and stable and Patient moving all extremities  Post vital signs: Reviewed and stable  Last Vitals:  Vitals Value Taken Time  BP 109/73 08/18/20 1315  Temp    Pulse 59 08/18/20 1318  Resp 17 08/18/20 1318  SpO2 99 % 08/18/20 1318  Vitals shown include unvalidated device data.  Last Pain:  Vitals:   08/18/20 1021  TempSrc:   PainSc: 0-No pain      Patients Stated Pain Goal: 3 (82/50/03 7048)  Complications: No complications documented.

## 2020-08-18 NOTE — Anesthesia Preprocedure Evaluation (Addendum)
Anesthesia Evaluation  Patient identified by MRN, date of birth, ID band Patient awake    Reviewed: Patient's Chart, lab work & pertinent test results  Airway Mallampati: II  TM Distance: >3 FB Neck ROM: Full    Dental  (+) Teeth Intact   Pulmonary neg pulmonary ROS,    Pulmonary exam normal        Cardiovascular hypertension, Pt. on medications  Rhythm:Regular Rate:Normal     Neuro/Psych negative neurological ROS  negative psych ROS   GI/Hepatic negative GI ROS, Neg liver ROS,   Endo/Other  diabetes, Type 2Hypothyroidism   Renal/GU ESRFRenal disease     Musculoskeletal negative musculoskeletal ROS (+)   Abdominal (+)  Abdomen: soft. Bowel sounds: normal.  Peds  Hematology  (+) anemia ,   Anesthesia Other Findings   Reproductive/Obstetrics                            Anesthesia Physical Anesthesia Plan  ASA: III  Anesthesia Plan: MAC   Post-op Pain Management:    Induction:   PONV Risk Score and Plan:   Airway Management Planned: Simple Face Mask and Nasal Cannula  Additional Equipment: None  Intra-op Plan:   Post-operative Plan:   Informed Consent: I have reviewed the patients History and Physical, chart, labs and discussed the procedure including the risks, benefits and alternatives for the proposed anesthesia with the patient or authorized representative who has indicated his/her understanding and acceptance.     Dental advisory given  Plan Discussed with: CRNA and Surgeon  Anesthesia Plan Comments: (Lab Results      Component                Value               Date                      WBC                      8.9                 06/11/2020                HGB                      10.2 (L)            08/18/2020                HCT                      30.0 (L)            08/18/2020                MCV                      76.0 (L)            06/11/2020                 PLT                      225                 06/11/2020           Lab Results  Component                Value               Date                      NA                       139                 08/18/2020                K                        4.2                 08/18/2020                CO2                      17 (L)              06/11/2020                GLUCOSE                  114 (H)             08/18/2020                BUN                      96 (H)              08/18/2020                CREATININE               5.80 (H)            08/18/2020                CALCIUM                  8.4 (L)             06/11/2020                GFRNONAA                 9 (L)               06/11/2020                GFRAA                    10 (L)              06/11/2020           ECHO 07/21: 1. Left ventricular ejection fraction, by estimation, is 55%. The left ventricle has normal function. The left ventricle has no regional wall motion abnormalities. The left ventricular internal cavity size was mildly dilated. There is mild left  ventricular hypertrophy. Left ventricular diastolic parameters are consistent with Grade II diastolic dysfunction (pseudonormalization). 2. Right ventricular systolic function is normal. The right ventricular size is normal. There is moderately elevated pulmonary artery systolic pressure. The estimated right ventricular systolic pressure is 85.9 mmHg. 3. Left atrial size was mildly dilated. 4. The  mitral valve is normal in structure. Mild mitral valve regurgitation. No evidence of mitral stenosis. 5. The aortic valve is tricuspid. Aortic valve regurgitation is not visualized. No aortic stenosis is present. 6. The inferior vena cava is dilated in size with <50% respiratory variability, suggesting right atrial pressure of 15 mmHg. 7. There is a small circumferential pericardial effusion. The IVC is dilated, but suspect this is due to CHF and not tamponade. No  evidence for tamponade.)       Anesthesia Quick Evaluation

## 2020-08-18 NOTE — Anesthesia Postprocedure Evaluation (Signed)
Anesthesia Post Note  Patient: [name], clinical  Procedure(s) Performed: LEFT ARM BRACHIOCEPHALIC ARTERIOVENOUS (AV) FISTULA CREATION (Left )     Patient location during evaluation: PACU Anesthesia Type: MAC Level of consciousness: awake and alert Pain management: pain level controlled Vital Signs Assessment: post-procedure vital signs reviewed and stable Respiratory status: spontaneous breathing, nonlabored ventilation, respiratory function stable and patient connected to nasal cannula oxygen Cardiovascular status: stable and blood pressure returned to baseline Postop Assessment: no apparent nausea or vomiting Anesthetic complications: no   No complications documented.  Last Vitals:  Vitals:   08/18/20 1345 08/18/20 1401  BP: 137/76 (!) 141/90  Pulse: (!) 58 62  Resp: 16 19  Temp:  36.4 C  SpO2: 93% 95%    Last Pain:  Vitals:   08/18/20 1021  TempSrc:   PainSc: 0-No pain                 Belenda Cruise P Tymesha Ditmore

## 2020-08-18 NOTE — Interval H&P Note (Signed)
History and Physical Interval Note:  08/18/2020 11:30 AM  Amy Moses  has presented today for surgery, with the diagnosis of CHRONIC KIDNEY DISEASE STAGE 4.  The various methods of treatment have been discussed with the patient and family. After consideration of risks, benefits and other options for treatment, the patient has consented to  Procedure(s): LEFT BRACHIOCEPHALIC ARTERIOVENOUS (AV) FISTULA CREATION (Left) as a surgical intervention.  The patient's history has been reviewed, patient examined, no change in status, stable for surgery.  I have reviewed the patient's chart and labs.  Questions were answered to the patient's satisfaction.     Ruta Hinds

## 2020-08-18 NOTE — Op Note (Signed)
Procedure: Left brachiocephalic AV fistula  Preoperative diagnosis: CKD 5  Postoperative diagnosis: Same  Anesthesia: Local with IV sedation  Assistant: Arlee Muslim, PA-C for assistance in exposure expediting procedure and creation of anastomosis.  Operative findings: 3-4/9 mm cephalic vein, slightly calcified left brachial artery  Operative details: After obtaining form consent, the patient was taken the operating.  The patient was placed in supine position operating table.  After adequate sedation patient's entire left upper extremities prepped and draped in usual sterile fashion.  Local anesthesia was infiltrated near the antecubital crease.  Incision was made in this location carried down through subcutaneous tissues down the level left cephalic vein.  It was a good quality about 3 mm in diameter to 3-1/2 mm in diameter.  Next the brachial artery was dissected free in the medial portion incision.  This is about 3 mm in diameter as well.  Patient was given 5000 intravenous heparin.  Vesseloops were used to control the artery proximally distally.  The distal cephalic vein was ligated with a 2-0 silk tie and transected.  It was gently distended with heparinized saline and marked for orientation.  It was then swung over the level of the artery 11 blade was used to make an arteriotomy in the brachial artery and the anastomosis constructed end of vein to side of artery using a running 7-0 Prolene suture.  Despite completion anastomosis it was for blood backbled and thoroughly flushed anastomosis was secured clamps released was palpable thrill in the fistula immediately.  Hemostasis was obtained.  Subcutaneous tissues were reapproximated using running 3-0 Vicryl suture.  Skin was closed with a 4-0 Vicryl subcuticular stitch.  Patient tired procedure well and there were no complications.  The instrument sponge and needle counts correct in the case.  Patient taken the recovery in stable condition.  Ruta Hinds, MD [facility] Office: [phone]

## 2020-08-18 NOTE — Discharge Instructions (Signed)
° °  [facility] ° °Discharge Instructions ° °AV Fistula or Graft Surgery for Dialysis Access ° °Please refer to the following instructions for your post-procedure care. Your surgeon or physician assistant will discuss any changes with you. ° °Activity ° °You may drive the day following your surgery, if you are comfortable and no longer taking prescription pain medication. Resume full activity as the soreness in your incision resolves. ° °Bathing/Showering ° °You may shower after you go home. Keep your incision dry for 48 hours. Do not soak in a bathtub, hot tub, or swim until the incision heals completely. You may not shower if you have a hemodialysis catheter. ° °Incision Care ° °Clean your incision with mild soap and water after 48 hours. Pat the area dry with a clean towel. You do not need a bandage unless otherwise instructed. Do not apply any ointments or creams to your incision. You may have skin glue on your incision. Do not peel it off. It will come off on its own in about one week. Your arm may swell a bit after surgery. To reduce swelling use pillows to elevate your arm so it is above your heart. Your doctor will tell you if you need to lightly wrap your arm with an ACE bandage. ° °Diet ° °Resume your normal diet. There are not special food restrictions following this procedure. In order to heal from your surgery, it is CRITICAL to get adequate nutrition. Your body requires vitamins, minerals, and protein. Vegetables are the best source of vitamins and minerals. Vegetables also provide the perfect balance of protein. Processed food has little nutritional value, so try to avoid this. ° °Medications ° °Resume taking all of your medications. If your incision is causing pain, you may take over-the counter pain relievers such as acetaminophen (Tylenol). If you were prescribed a stronger pain medication, please be aware these medications can cause nausea and constipation. Prevent  nausea by taking the medication with a snack or meal. Avoid constipation by drinking plenty of fluids and eating foods with high amount of fiber, such as fruits, vegetables, and grains. Do not take Tylenol if you are taking prescription pain medications. ° ° ° ° °Follow up °Your surgeon may want to see you in the office following your access surgery. If so, this will be arranged at the time of your surgery. ° °Please call us immediately for any of the following conditions: ° °Increased pain, redness, drainage (pus) from your incision site °Fever of 101 degrees or higher °Severe or worsening pain at your incision site °Hand pain or numbness. ° °Reduce your risk of vascular disease: ° °Stop smoking. If you would like help, call [facility] at 1-800-QUIT-NOW (1-800-784-8669) or [facility] at 336-586-4000 ° °Manage your cholesterol °Maintain a desired weight °Control your diabetes °Keep your blood pressure down ° °Dialysis ° °It will take several weeks to several months for your new dialysis access to be ready for use. Your surgeon will determine when it is OK to use it. Your nephrologist will continue to direct your dialysis. You can continue to use your Permcath until your new access is ready for use. ° °If you have any questions, please call the office at [phone]. ° °

## 2020-08-19 ENCOUNTER — Encounter (HOSPITAL_COMMUNITY): Payer: Self-pay | Admitting: Vascular Surgery

## 2020-09-08 ENCOUNTER — Other Ambulatory Visit: Payer: Self-pay | Admitting: *Deleted

## 2020-09-08 DIAGNOSIS — N185 Chronic kidney disease, stage 5: Secondary | ICD-10-CM

## 2020-09-11 ENCOUNTER — Other Ambulatory Visit (HOSPITAL_COMMUNITY): Payer: Self-pay | Admitting: Nephrology

## 2020-09-11 DIAGNOSIS — N185 Chronic kidney disease, stage 5: Secondary | ICD-10-CM

## 2020-09-14 ENCOUNTER — Other Ambulatory Visit: Payer: Self-pay | Admitting: Student

## 2020-09-14 NOTE — Progress Notes (Signed)
Pt returned phone call to this RN. She states that she had the dates mixed up and she will be here for her procedure at 1030. Reviewed instructions with pt and she verbalizes understanding.

## 2020-09-15 ENCOUNTER — Other Ambulatory Visit (HOSPITAL_COMMUNITY): Payer: Self-pay | Admitting: Nephrology

## 2020-09-15 ENCOUNTER — Encounter (HOSPITAL_COMMUNITY): Payer: Self-pay

## 2020-09-15 ENCOUNTER — Other Ambulatory Visit: Payer: Self-pay

## 2020-09-15 ENCOUNTER — Ambulatory Visit (HOSPITAL_COMMUNITY)
Admission: RE | Admit: 2020-09-15 | Discharge: 2020-09-15 | Disposition: A | Discharge status: Home / Self Care | Payer: Medicaid - Out of State | Source: Ambulatory Visit | Attending: Nephrology | Admitting: Nephrology

## 2020-09-15 DIAGNOSIS — I120 Hypertensive chronic kidney disease with stage 5 chronic kidney disease or end stage renal disease: Secondary | ICD-10-CM | POA: Insufficient documentation

## 2020-09-15 DIAGNOSIS — E1122 Type 2 diabetes mellitus with diabetic chronic kidney disease: Secondary | ICD-10-CM | POA: Insufficient documentation

## 2020-09-15 DIAGNOSIS — N185 Chronic kidney disease, stage 5: Secondary | ICD-10-CM | POA: Diagnosis not present

## 2020-09-15 HISTORY — PX: IR US GUIDE VASC ACCESS RIGHT: IMG2390

## 2020-09-15 HISTORY — PX: IR PERC TUN PERIT CATH WO PORT S&I /IMAG: IMG2327

## 2020-09-15 LAB — GLUCOSE, CAPILLARY
Glucose-Capillary: 169 mg/dL — ABNORMAL HIGH (ref 70–99)
Glucose-Capillary: 163 mg/dL — ABNORMAL HIGH (ref 70–99)

## 2020-09-15 MED ORDER — LORAZEPAM 2 MG/ML IJ SOLN
INTRAMUSCULAR | Status: AC | PRN
  Administered 2020-09-15: 1 mg via INTRAVENOUS

## 2020-09-15 MED ORDER — CEFAZOLIN SODIUM-DEXTROSE 2-4 GM/100ML-% IV SOLN
2.0000 g | Freq: Once | INTRAVENOUS | Status: AC
  Administered 2020-09-15: 2 g via INTRAVENOUS

## 2020-09-15 MED ORDER — FENTANYL CITRATE (PF) 100 MCG/2ML IJ SOLN
INTRAMUSCULAR | Status: AC
  Filled 2020-09-15: qty 2

## 2020-09-15 MED ORDER — HEPARIN SODIUM (PORCINE) 1000 UNIT/ML IJ SOLN
INTRAMUSCULAR | Status: AC
  Filled 2020-09-15: qty 1

## 2020-09-15 MED ORDER — MIDAZOLAM HCL 2 MG/2ML IJ SOLN
INTRAMUSCULAR | Status: AC | PRN
  Administered 2020-09-15: 0.5 mg via INTRAVENOUS

## 2020-09-15 MED ORDER — CEFAZOLIN SODIUM-DEXTROSE 2-4 GM/100ML-% IV SOLN
INTRAVENOUS | Status: AC
  Filled 2020-09-15: qty 100

## 2020-09-15 MED ORDER — SODIUM CHLORIDE 0.9 % IV SOLN
INTRAVENOUS | Status: AC | PRN
  Administered 2020-09-15: 10 mL/h via INTRAVENOUS

## 2020-09-15 MED ORDER — LIDOCAINE HCL 1 % IJ SOLN
INTRAMUSCULAR | Status: AC
  Filled 2020-09-15: qty 20

## 2020-09-15 MED ORDER — FENTANYL CITRATE (PF) 100 MCG/2ML IJ SOLN
INTRAMUSCULAR | Status: AC | PRN
Start: 2020-09-15 — End: 2020-09-15
  Administered 2020-09-15: 50 ug via INTRAVENOUS
  Administered 2020-09-15: 25 ug via INTRAVENOUS

## 2020-09-15 MED ORDER — SODIUM CHLORIDE 0.9 % IV SOLN: INTRAVENOUS | Status: DC

## 2020-09-15 MED ORDER — MIDAZOLAM HCL 2 MG/2ML IJ SOLN
INTRAMUSCULAR | Status: AC
  Filled 2020-09-15: qty 2

## 2020-09-15 MED ORDER — LIDOCAINE HCL 1 % IJ SOLN
INTRAMUSCULAR | Status: AC | PRN
  Administered 2020-09-15: 10 mL

## 2020-09-15 NOTE — Procedures (Signed)
Interventional Radiology Procedure Note  Procedure: Tunneled HD catheter placement  Complications: None  Estimated Blood Loss: < 10 mL  Findings: 19 cm tip to cuff length Palindrome catheter placed via right IJ with tip in RA. OK to use.  Venetia Night. Kathlene Cote, M.D Pager:  [phone]

## 2020-09-15 NOTE — H&P (Signed)
Chief Complaint: Patient was seen in consultation today for tunneled hemodialysis catheter placement at the request of [place]  Referring Physician(s): [place]  Supervising Physician: Aletta Edouard  Patient Status: [facility] - Out-pt  History of Present Illness: Amy Moses is a 49 y.o. female   DM; CKD5 SOB; BLE edema Nephrotic range proteinuria  Permanent access fistula placed in left arm 08/18/20 Not yet mature Progression of failure Dr Theador Hawthorne requesting tunneled HD catheter now to initiate dialysis  Scheduled today for same   Past Medical History:  Diagnosis Date  . Anemia   . 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 Diabetes mellitus, type II ([place])   . Dyslipidemia   . Glaucoma   . Hypertension   . Hypothyroidism (acquired)   . Kidney disease    Stage 5  . Pneumonia     Past Surgical History:  Procedure Laterality Date  . ANKLE FRACTURE SURGERY    . AV FISTULA PLACEMENT Left 08/18/2020   Procedure: LEFT ARM BRACHIOCEPHALIC ARTERIOVENOUS (AV) FISTULA CREATION;  Surgeon: Elam Dutch, MD;  Location: [facility];  Service: Vascular;  Laterality: Left;  . CESAREAN SECTION    . CHOLECYSTECTOMY    . EYE SURGERY     Vatrectomy  . TOE SURGERY      Allergies: Lisinopril  Medications: Prior to Admission medications   Medication Sig Start Date End Date Taking? Authorizing Provider  ANTACID EXTRA STRENGTH 750 MG chewable tablet Chew 750 mg by mouth daily. 08/14/20  Yes [provider]  atorvastatin (LIPITOR) 10 MG tablet Take 10 mg by mouth daily. 05/14/20  Yes [provider]  calcitRIOL (ROCALTROL) 0.25 MCG capsule Take 0.25 mcg by mouth daily.   Yes [provider]  cephALEXin (KEFLEX) 500 MG capsule Take 500 mg by mouth 2 (two) times daily.   Yes [provider]  epoetin alfa (EPOGEN) 4000 UNIT/ML injection Inject 4,000 Units into the skin every 14  (fourteen) days.  06/19/20  Yes [provider]  HUMALOG KWIKPEN 100 UNIT/ML KwikPen Inject 5-10 Units into the skin 3 (three) times daily with meals.  03/03/20  Yes [provider]  insulin glargine (LANTUS) 100 UNIT/ML injection Inject 50 Units into the skin at bedtime.   Yes [provider]  levothyroxine (SYNTHROID) 50 MCG tablet Take 50 mcg by mouth daily before breakfast.   Yes [provider]  metoprolol succinate (TOPROL-XL) 50 MG 24 hr tablet Take 50 mg by mouth at bedtime. Take with or immediately following a meal.    Yes [provider]  sodium bicarbonate 650 MG tablet Take 1,300 mg by mouth 3 (three) times daily.   Yes [provider]  torsemide (DEMADEX) 100 MG tablet Take 150 mg by mouth daily.  05/22/20  Yes [provider]  amLODipine (NORVASC) 10 MG tablet Take 10 mg by mouth daily. Patient not taking: Reported on 09/14/2020    [provider]  Calcium-Phosphorus-Vitamin D (CITRACAL +D3 PO) Take by mouth daily. Patient not taking: Reported on 09/14/2020    [provider]  ELDERBERRY PO Take 1 capsule by mouth daily. Patient not taking: Reported on 09/14/2020    [provider]  HYDROcodone-acetaminophen (NORCO) 5-325 MG tablet Take 1 tablet by mouth every 6 (six) hours as needed for moderate pain. Patient not taking: Reported on 09/14/2020 08/18/20   Dagoberto Ligas, PA-C  levothyroxine (SYNTHROID) 75 MCG tablet Take 1 tablet (75  mcg total) by mouth daily. Patient not taking: Reported on 08/11/2020 06/11/20 08/11/20  Darliss Cheney, MD     Family History  Problem Relation Age of Onset  . Heart disease Mother   . Diabetes Mother   . Kidney disease Mother   . Diabetes Father   . Heart disease Father   . Diabetes Brother     Social History   Socioeconomic History  . Marital status: Single    Spouse name: Not on file  . Number of children: Not on file  . Years of education: Not on file    . Highest education level: Not on file  Occupational History  . Not on file  Tobacco Use  . Smoking status: Never Smoker  . Smokeless tobacco: Never Used  Vaping Use  . Vaping Use: Never used  Substance and Sexual Activity  . Alcohol use: No  . Drug use: No  . Sexual activity: Yes    Birth control/protection: Condom  Other Topics Concern  . Not on file  Social History Narrative  . Not on file   Social Determinants of Health   Financial Resource Strain:   . Difficulty of Paying Living Expenses: Not on file  Food Insecurity:   . Worried About Charity fundraiser in the Last Year: Not on file  . Ran Out of Food in the Last Year: Not on file  Transportation Needs:   . Lack of Transportation (Medical): Not on file  . Lack of Transportation (Non-Medical): Not on file  Physical Activity:   . Days of Exercise per Week: Not on file  . Minutes of Exercise per Session: Not on file  Stress:   . Feeling of Stress : Not on file  Social Connections:   . Frequency of Communication with Friends and Family: Not on file  . Frequency of Social Gatherings with Friends and Family: Not on file  . Attends Religious Services: Not on file  . Active Member of Clubs or Organizations: Not on file  . Attends Archivist Meetings: Not on file  . Marital Status: Not on file     Review of Systems: A 12 point ROS discussed and pertinent positives are indicated in the HPI above.  All other systems are negative.  Review of Systems  Constitutional: Negative for activity change, appetite change, fatigue and fever.  Respiratory: Positive for shortness of breath.   Cardiovascular: Positive for leg swelling. Negative for chest pain.  Gastrointestinal: Negative for abdominal pain.  Genitourinary: Positive for decreased urine volume.  Psychiatric/Behavioral: Negative for behavioral problems and confusion.    Vital Signs: BP (!) 195/95   Pulse 71   Temp 97.8 F (36.6 C) (Oral)   Ht 5\' 6"   (1.676 m)   Wt 230 lb (104.3 kg)   LMP 08/22/2015 (Approximate)   SpO2 100%   BMI 37.12 kg/m   Physical Exam Vitals reviewed.  Eyes:     Comments: Blind right eye  Cardiovascular:     Rate and Rhythm: Normal rate and regular rhythm.     Heart sounds: Normal heart sounds.  Pulmonary:     Effort: Pulmonary effort is normal.     Breath sounds: Normal breath sounds.  Abdominal:     Palpations: Abdomen is soft.  Musculoskeletal:        General: Normal range of motion.  Skin:    General: Skin is warm.  Neurological:     Mental Status: She is alert and oriented to person,  place, and time.  Psychiatric:        Behavior: Behavior normal.     Imaging: No results found.  Labs:  CBC: Recent Labs    06/10/20 0631 06/11/20 0004 06/11/20 0336 08/18/20 0940  WBC 8.1 9.3 8.9  --   HGB 6.4* 8.1* 8.0* 10.2*  HCT 21.5* 26.1* 25.9* 30.0*  PLT 233 232 225  --     COAGS: No results for input(s): INR, APTT in the last 8760 hours.  BMP: Recent Labs    06/10/20 1154 06/11/20 0336 08/18/20 0940  NA 136 137 139  K 3.8 4.2 4.2  CL 104 106 102  CO2 16* 17*  --   GLUCOSE 211* 108* 114*  BUN 111* 114* 96*  CALCIUM 8.4* 8.4*  --   CREATININE 5.32* 5.40* 5.80*  GFRNONAA 9* 9*  --   GFRAA 10* 10*  --     LIVER FUNCTION TESTS: Recent Labs    06/10/20 1154  BILITOT 1.4*  AST 12*  ALT 14  ALKPHOS 129*  PROT 6.4*  ALBUMIN 2.9*    TUMOR MARKERS: No results for input(s): AFPTM, CEA, CA199, CHROMGRNA in the last 8760 hours.  Assessment and Plan:  CKD 5; nephrotic range proteinuria Rising creatinine Left arm fistula placed 9/21-- not yet mature Progression of failure To initiate dialysis now per Nephrology Risks and benefits discussed with the patient including, but not limited to bleeding, infection, vascular injury, pneumothorax which may require chest tube placement, air embolism or even death  All of the patient's questions were answered, patient is agreeable to  proceed. Consent signed and in chart.    Thank you for this interesting consult.  I greatly enjoyed meeting Jazae Gandolfi and look forward to participating in their care.  A copy of this report was sent to the requesting provider on this date.  Electronically Signed: Lavonia Drafts, PA-C 09/15/2020, 11:15 AM   I spent a total of  30 Minutes   in face to face in clinical consultation, greater than 50% of which was counseling/coordinating care for tunneled HD catheter

## 2020-09-15 NOTE — Discharge Instructions (Signed)
Tunneled Catheter Insertion, Care After This sheet gives you information about how to care for yourself after your procedure. Your health care provider may also give you more specific instructions. If you have problems or questions, contact your health care provider. What can I expect after the procedure? After the procedure, it is common to have:  Some mild redness, bruising, swelling, and pain around your catheter site.  A small amount of blood or clear fluid coming from your incisions. Follow these instructions at home: Incision care   Follow instructions from your health care provider about how to take care of your incisions. Make sure you: ? Wash your hands with soap and water before and after you change your bandages (dressings). If soap and water are not available, use hand sanitizer. ? Change your dressings as told by your health care provider. Wash the area around your incisions with a germ-killing (antiseptic) solution when you change your dressings. ? Leave stitches (sutures), skin glue, or adhesive strips in place. These skin closures may need to stay in place for 2 weeks or longer. If adhesive strip edges start to loosen and curl up, you may trim the loose edges. Do not remove adhesive strips completely unless your health care provider tells you to do that.  Keep your dressings clean and dry.  Check your incision areas every day for signs of infection. Check for: ? More redness, swelling, or pain. ? More fluid or blood. ? Warmth. ? Pus or a bad smell. Catheter care   Wash your hands with soap and water before and after caring for your catheter. If soap and water are not available, use hand sanitizer.  Keep your catheter site clean and dry.  Apply an antibiotic ointment to your catheter site as told by your health care provider.  Flush your catheter as told by your health care provider. This helps prevent it from becoming clogged.  Do not open the caps on the ends of  the catheter.  Do not pull on your catheter. Medicines  Take over-the-counter and prescription medicines only as told by your health care provider.  If you were prescribed an antibiotic medicine, take it as told by your health care provider. Do not stop taking the antibiotic even if you start to feel better. Activity  Return to your normal activities as told by your health care provider. Ask your health care provider what activities are safe for you.  Follow any other activity restrictions as instructed by your health care provider.  Do not lift anything that is heavier than 10 lb (4.5 kg), or the limit that you are told, until your health care provider says that it is safe. Driving  Do not drive until your health care provider approves.  Ask your health care provider if the medicine prescribed to you requires you to avoid driving or using heavy machinery. General instructions  Follow your health care provider's specific instructions for the type of catheter that you have.  Do not take baths, swim, or use a hot tub until your health care provider approves. Ask your health care provider if you may take showers.  Keep all follow-up visits as told by your health care provider. This is important. Contact a health care provider if:  You feel unusually weak or nauseous.  You have more redness, swelling, or pain at your incisions or around the area where your catheter is inserted.  Your catheter is not working properly.  You are unable to flush your catheter.   Get help right away if:  Your catheter develops a hole or it breaks.  You have pain or swelling when fluids or medicines are being given through the catheter.  Fluid is leaking from the catheter, under the dressing, or around the dressing.  Your catheter comes loose or gets pulled completely out. If this happens, press on your catheter site firmly with a clean cloth until you can get medical help.  You have swelling in  your shoulder, neck, chest, or face.  You have chest pain or difficulty breathing.  You feel dizzy or light-headed.  You have pus or a bad smell coming from your catheter site.  You have a fever or chills.  Your catheter site feels warm to the touch.  You develop bleeding from your catheter or your insertion site, and your bleeding does not stop. Summary  After the procedure, it is common to have mild redness, swelling, and pain around your catheter site.  Return to your normal activities as told by your health care provider. Ask your health care provider what activities are safe for you.  Follow your health care provider's specific instructions for the type of catheter that you have.  Keep your catheter site and your dressings clean and dry.  Contact a health care provider if your catheter is not working properly. Get help right away if you have chest pain, fever, or difficulty breathing. This information is not intended to replace advice given to you by your health care provider. Make sure you discuss any questions you have with your health care provider. Document Revised: 10/30/2018 Document Reviewed: 10/30/2018 Elsevier Patient Education  2020 [employer]  

## 2020-09-15 NOTE — Progress Notes (Signed)
Discharge instructions reviewed with pt and her daughter (via telephone) Both voice understanding. Blue clamps and instructions on how to use given to pt voices understanding.

## 2020-09-24 ENCOUNTER — Ambulatory Visit (HOSPITAL_COMMUNITY)
Admission: RE | Admit: 2020-09-24 | Discharge: 2020-09-24 | Disposition: A | Discharge status: Home / Self Care | Payer: Medicaid - Out of State | Source: Ambulatory Visit | Attending: Physician Assistant | Admitting: Physician Assistant

## 2020-09-24 ENCOUNTER — Ambulatory Visit (INDEPENDENT_AMBULATORY_CARE_PROVIDER_SITE_OTHER): Payer: Self-pay | Admitting: Physician Assistant

## 2020-09-24 ENCOUNTER — Other Ambulatory Visit: Payer: Self-pay

## 2020-09-24 VITALS — BP 171/81 | HR 80 | Temp 98.7°F | Resp 20 | Ht 66.0 in | Wt 219.0 lb

## 2020-09-24 DIAGNOSIS — N185 Chronic kidney disease, stage 5: Secondary | ICD-10-CM | POA: Insufficient documentation

## 2020-09-24 NOTE — Progress Notes (Addendum)
    Postoperative Access Visit   History of Present Illness   Amy Moses is a 49 y.o. year old female who presents for postoperative follow-up for: left brachiocephalic AV fistula by Dr. Oneida Alar on 08/18/20. The patient's wounds are healed. The patient notes no steal symptoms. Her left hand will get a little cold if she props or holds it up but when she puts it back down by her side the coldness resolves.   She just started HD yesterday 09/23/20 via a right IJ TDC placed by IR. Her nephrologist is Dr.Bhandari . She goes to the [facility] in [place]. She will be a Tues/ Thurs/ Sat Dialysis.  Physical Examination   Vitals:   09/24/20 1216  BP: (!) 171/81  Pulse: 80  Resp: 20  Temp: 98.7 F (37.1 C)  TempSrc: Temporal  SpO2: 99%  Weight: 219 lb (99.3 kg)  Height: 5\' 6"  (1.676 m)   Body mass index is 35.35 kg/m.  left arm Incision is well healed, 2+ radial pulse, hand grip is 5/5, sensation in digits is intact, palpable thrill, bruit can  be auscultated     Non- Invasive vascular lab study: 09/24/20 Findings:  +--------------------+----------+-----------------+--------+  AVF         PSV (cm/s)Flow Vol (mL/min)Comments  +--------------------+----------+-----------------+--------+  Native artery inflow  310      862          +--------------------+----------+-----------------+--------+  AVF Anastomosis     550                 +--------------------+----------+-----------------+--------+   +------------+----------+-------------+----------+----------------+  OUTFLOW VEINPSV (cm/s)Diameter (cm)Depth (cm)  Describe    +------------+----------+-------------+----------+----------------+  Shoulder    159    0.60     0.50  competing branch  +------------+----------+-------------+----------+----------------+  Prox UA     173    0.57     0.31              +------------+----------+-------------+----------+----------------+  Mid UA     139    0.75     0.22  competing branch  +------------+----------+-------------+----------+----------------+  Dist UA     448    0.68     0.18            +------------+----------+-------------+----------+----------------+  AC Fossa    652    0.46     0.30            +------------+----------+-------------+----------+----------------+      Medical Decision Making    Amy Moses is a 49 y.o. year old female who presents s/p left brachiocephalic AV fistula by Dr. Oneida Alar on 08/18/20. Her left arm is well healed. The fistula is working well. Duplex shows good volume flow and fistula is good depth and diameter for access. It shows she does have some competing branches. These may need ligated at a later time if there are any issues with volume flow but clinically fistula appears well functioning.   Patent is without signs or symptoms of steal syndrome  The patient's access will be ready for use after 11/17/20  The patient's tunneled dialysis catheter can be removed when Nephrology is comfortable with the performance of the left brachiocephalic AV fistula. Catheter was placed by IR so they can be contacted for removal  The patient may follow up on a prn basis   Karoline Caldwell, PA-C [facility] Office: [phone]  Clinic MD: Dr. Oneida Alar

## 2020-10-29 DIAGNOSIS — S82002A Unspecified fracture of left patella, initial encounter for closed fracture: Secondary | ICD-10-CM | POA: Insufficient documentation

## 2020-11-01 DIAGNOSIS — E11628 Type 2 diabetes mellitus with other skin complications: Secondary | ICD-10-CM | POA: Insufficient documentation

## 2020-11-01 DIAGNOSIS — L89609 Pressure ulcer of unspecified heel, unspecified stage: Secondary | ICD-10-CM | POA: Insufficient documentation

## 2020-11-03 DIAGNOSIS — S91309A Unspecified open wound, unspecified foot, initial encounter: Secondary | ICD-10-CM | POA: Insufficient documentation

## 2020-11-09 ENCOUNTER — Other Ambulatory Visit: Payer: Self-pay

## 2020-11-09 ENCOUNTER — Telehealth: Payer: Self-pay

## 2020-11-09 NOTE — Telephone Encounter (Signed)
Patient has non healing wounds on bilateral thighs and one on her right heel. She was referred to [facility] for art duplex, but she would prefer to stay with [name] - we placed her HD access. Will place patient on schedule for ABIs, bil LE art duplex and f/u visit.

## 2020-11-16 ENCOUNTER — Other Ambulatory Visit: Payer: Self-pay

## 2020-11-16 DIAGNOSIS — S81809A Unspecified open wound, unspecified lower leg, initial encounter: Secondary | ICD-10-CM

## 2020-11-16 DIAGNOSIS — N186 End stage renal disease: Secondary | ICD-10-CM | POA: Insufficient documentation

## 2020-11-19 ENCOUNTER — Ambulatory Visit (HOSPITAL_COMMUNITY)
Admission: RE | Admit: 2020-11-19 | Discharge: 2020-11-19 | Disposition: A | Discharge status: Home / Self Care | Payer: Medicaid - Out of State | Source: Ambulatory Visit | Attending: Physician Assistant | Admitting: Physician Assistant

## 2020-11-19 ENCOUNTER — Other Ambulatory Visit: Payer: Self-pay

## 2020-11-19 ENCOUNTER — Ambulatory Visit (INDEPENDENT_AMBULATORY_CARE_PROVIDER_SITE_OTHER)
Admission: RE | Admit: 2020-11-19 | Discharge: 2020-11-19 | Disposition: A | Discharge status: Home / Self Care | Payer: Medicaid - Out of State | Source: Ambulatory Visit | Attending: Physician Assistant | Admitting: Physician Assistant

## 2020-11-19 ENCOUNTER — Ambulatory Visit (INDEPENDENT_AMBULATORY_CARE_PROVIDER_SITE_OTHER): Payer: Medicaid - Out of State | Admitting: Physician Assistant

## 2020-11-19 VITALS — BP 171/79 | HR 72 | Temp 98.2°F | Resp 20 | Ht 66.0 in | Wt 230.0 lb

## 2020-11-19 DIAGNOSIS — S81809A Unspecified open wound, unspecified lower leg, initial encounter: Secondary | ICD-10-CM | POA: Insufficient documentation

## 2020-11-19 DIAGNOSIS — Z992 Dependence on renal dialysis: Secondary | ICD-10-CM

## 2020-11-19 DIAGNOSIS — N186 End stage renal disease: Secondary | ICD-10-CM | POA: Diagnosis not present

## 2020-11-19 DIAGNOSIS — I739 Peripheral vascular disease, unspecified: Secondary | ICD-10-CM

## 2020-11-19 MED ORDER — HYDROCODONE-ACETAMINOPHEN 5-325 MG PO TABS: 1.0000 | ORAL_TABLET | Freq: Four times a day (QID) | ORAL | 0 refills | Status: DC | PRN

## 2020-11-19 NOTE — Progress Notes (Signed)
Established Critical Limb Ischemia Patient   History of Present Illness   Amy Moses is a 49 y.o. (04-19-1971) female who presents for evaluation of bilateral thigh and right heel wounds.  She has been seen in our office in the past and had left brachiocephalic AV fistula creation by Dr. Oneida Alar on 08/18/2020.  She is dialyzing via left arm fistula on a Tuesday Thursday Saturday schedule at [facility] in [place].  This is managed by nephrologist Dr. Carolin Sicks.  She presents today for evaluation of bilateral medial thigh and left lateral thigh ulcerations as well as right heel ulceration.  She was scheduled for evaluation by the wound clinic in [place] however was hospitalized during this appointment and has not been able to reschedule.  She believes the ulcerations are getting worse.  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  She has been caring for these wounds with Xeroform gauze.  She denies fevers, chills, nausea/vomiting.  Past medical history also significant for insulin-dependent diabetes mellitus.  The patient's PMH, PSH, SH, and FamHx were reviewed and are unchanged from prior visit.  Current Outpatient Medications  Medication Sig Dispense Refill  . amLODipine (NORVASC) 10 MG tablet Take 10 mg by mouth daily.     . ANTACID EXTRA STRENGTH 750 MG chewable tablet Chew 750 mg by mouth daily.    Marland Kitchen atorvastatin (LIPITOR) 10 MG tablet Take 10 mg by mouth daily.    . calcitRIOL (ROCALTROL) 0.25 MCG capsule Take 0.25 mcg by mouth daily.    . Calcium-Phosphorus-Vitamin D (CITRACAL +D3 PO) Take by mouth daily.     . cephALEXin (KEFLEX) 500 MG capsule Take 500 mg by mouth 2 (two) times daily.    Marland Kitchen ELDERBERRY PO Take 1 capsule by mouth daily.     Marland Kitchen epoetin alfa (EPOGEN) 4000 UNIT/ML injection Inject 4,000 Units into the skin every 14 (fourteen) days.     Marland Kitchen HUMALOG KWIKPEN 100 UNIT/ML KwikPen Inject 5-10 Units into the skin 3 (three) times daily with meals.     Marland Kitchen  HYDROcodone-acetaminophen (NORCO) 5-325 MG tablet Take 1 tablet by mouth every 6 (six) hours as needed for moderate pain. 10 tablet 0  . insulin glargine (LANTUS) 100 UNIT/ML injection Inject 50 Units into the skin at bedtime.    Marland Kitchen levothyroxine (SYNTHROID) 50 MCG tablet Take 50 mcg by mouth daily before breakfast.    . metoprolol succinate (TOPROL-XL) 50 MG 24 hr tablet Take 50 mg by mouth at bedtime. Take with or immediately following a meal.    . sodium bicarbonate 650 MG tablet Take 1,300 mg by mouth 3 (three) times daily.    Marland Kitchen torsemide (DEMADEX) 100 MG tablet Take 150 mg by mouth daily.      No current facility-administered medications for this visit.    REVIEW OF SYSTEMS (negative unless checked):   Cardiac:  []  Chest pain or chest pressure? []  Shortness of breath upon activity? []  Shortness of breath when lying flat? []  Irregular heart rhythm?  Vascular:  []  Pain in calf, thigh, or hip brought on by walking? []  Pain in feet at night that wakes you up from your sleep? []  Blood clot in your veins? []  Leg swelling?  Pulmonary:  []  Oxygen at home? []  Productive cough? []  Wheezing?  Neurologic:  []  Sudden weakness in arms or legs? []  Sudden numbness in arms or legs? []  Sudden onset of difficult speaking or slurred speech? []  Temporary loss of vision in one eye? []  Problems  with dizziness?  Gastrointestinal:  []  Blood in stool? []  Vomited blood?  Genitourinary:  []  Burning when urinating? []  Blood in urine?  Psychiatric:  []  Major depression  Hematologic:  []  Bleeding problems? []  Problems with blood clotting?  Dermatologic:  []  Rashes or ulcers?  Constitutional:  []  Fever or chills?  Ear/Nose/Throat:  []  Change in hearing? []  Nose bleeds? []  Sore throat?  Musculoskeletal:  []  Back pain? []  Joint pain? []  Muscle pain?   Physical Examination   Vitals:   11/19/20 0946  BP: (!) 171/79  Pulse: 72  Resp: 20  Temp: 98.2 F (36.8 C)  TempSrc:  Temporal  SpO2: 99%  Weight: 230 lb (104.3 kg)  Height: 5\' 6"  (1.676 m)   Body mass index is 37.12 kg/m.  General:  WDWN in NAD; vital signs documented above Gait: Not observed HENT: WNL, normocephalic Pulmonary: normal non-labored breathing  Cardiac: regular HR Abdomen: soft, NT, no masses Skin: without rashes Vascular Exam/Pulses:  Right Left  Radial 2+ (normal) 2+ (normal)  DP absent absent  PT absent absent   Extremities: Large areas of necrotic ulceration of bilateral medial thighs; moderate sized area of left lateral thigh; large necrotic ulceration of right heel; palpable thrill left arm AV fistula Musculoskeletal: no muscle wasting or atrophy  Neurologic: A&O X 3;  No focal weakness or paresthesias are detected Psychiatric:  The pt has Normal affect.        Non-Invasive Vascular Imaging   ABI   R:   ABI: Unable to obtain waveform  TBI: 0.57  L:   ABI: Unable to obtain waveform  TBI: 0.54   Bilateral lower extremity arterial duplex demonstrates biphasic waveforms to the popliteal artery then tibial vessels are not well visualized   Medical Decision Making   Amy Moses is a 49 y.o. female who presents with extensive wounds of bilateral thighs and right heel   Appearance of thigh wounds are concerning for calciphylaxis; continue daily cleansing and application of Xeroform and Kerlix; urgent referral will be made to [facility] wound clinic  Encouraged patient to avoid pressure to right heel  This case was discussed with Dr. Oneida Alar who has offered the patient aortogram with bilateral lower extremity runoff and possible intervention of right lower extremity due to large necrotic right heel ulceration.  This will be scheduled for 11/27/2020  Patient will also be prescribed a one-time narcotic prescription #30 Norco 5/325mg   All questions were answered and patient agrees to the above procedure   Dagoberto Ligas PA-C [facility]  [place] Office: [phone]  Clinic MD: Oneida Alar

## 2020-11-19 NOTE — Addendum Note (Signed)
Addended byDagoberto Ligas on: 11/19/2020 10:56 AM   Modules accepted: Orders

## 2020-11-25 ENCOUNTER — Other Ambulatory Visit (HOSPITAL_COMMUNITY)
Admission: RE | Admit: 2020-11-25 | Discharge: 2020-11-25 | Disposition: A | Discharge status: Home / Self Care | Payer: Medicaid - Out of State | Source: Ambulatory Visit | Attending: Vascular Surgery | Admitting: Vascular Surgery

## 2020-11-25 DIAGNOSIS — Z01812 Encounter for preprocedural laboratory examination: Secondary | ICD-10-CM | POA: Diagnosis not present

## 2020-11-25 DIAGNOSIS — Z20822 Contact with and (suspected) exposure to covid-19: Secondary | ICD-10-CM | POA: Insufficient documentation

## 2020-11-26 LAB — SARS CORONAVIRUS 2 (TAT 6-24 HRS): SARS Coronavirus 2: NEGATIVE

## 2020-11-27 ENCOUNTER — Ambulatory Visit (HOSPITAL_COMMUNITY)
Admission: RE | Admit: 2020-11-27 | Payer: Medicaid - Out of State | Source: Home / Self Care | Admitting: Vascular Surgery

## 2020-11-27 ENCOUNTER — Encounter (HOSPITAL_COMMUNITY): Admission: RE | Payer: Self-pay | Source: Home / Self Care

## 2020-11-27 SURGERY — ABDOMINAL AORTOGRAM W/LOWER EXTREMITY
Anesthesia: LOCAL

## 2020-11-27 NOTE — Progress Notes (Signed)
Patient called short stay to cancel procedure.  Will let office know to get rescheduled.

## 2020-12-02 ENCOUNTER — Other Ambulatory Visit: Payer: Self-pay

## 2020-12-02 ENCOUNTER — Encounter: Payer: Medicaid - Out of State | Attending: Internal Medicine | Admitting: Internal Medicine

## 2020-12-02 DIAGNOSIS — Z992 Dependence on renal dialysis: Secondary | ICD-10-CM | POA: Diagnosis not present

## 2020-12-02 DIAGNOSIS — E11622 Type 2 diabetes mellitus with other skin ulcer: Secondary | ICD-10-CM | POA: Insufficient documentation

## 2020-12-02 DIAGNOSIS — E1142 Type 2 diabetes mellitus with diabetic polyneuropathy: Secondary | ICD-10-CM | POA: Diagnosis not present

## 2020-12-02 DIAGNOSIS — L89610 Pressure ulcer of right heel, unstageable: Secondary | ICD-10-CM | POA: Insufficient documentation

## 2020-12-02 DIAGNOSIS — E11621 Type 2 diabetes mellitus with foot ulcer: Secondary | ICD-10-CM | POA: Insufficient documentation

## 2020-12-02 DIAGNOSIS — N186 End stage renal disease: Secondary | ICD-10-CM | POA: Diagnosis not present

## 2020-12-02 DIAGNOSIS — E1136 Type 2 diabetes mellitus with diabetic cataract: Secondary | ICD-10-CM | POA: Diagnosis not present

## 2020-12-02 DIAGNOSIS — Z8249 Family history of ischemic heart disease and other diseases of the circulatory system: Secondary | ICD-10-CM | POA: Diagnosis not present

## 2020-12-02 DIAGNOSIS — L97118 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: Secondary | ICD-10-CM | POA: Insufficient documentation

## 2020-12-02 DIAGNOSIS — E1122 Type 2 diabetes mellitus with diabetic chronic kidney disease: Secondary | ICD-10-CM | POA: Diagnosis not present

## 2020-12-02 DIAGNOSIS — L97128 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: Secondary | ICD-10-CM | POA: Diagnosis not present

## 2020-12-02 DIAGNOSIS — Z833 Family history of diabetes mellitus: Secondary | ICD-10-CM | POA: Diagnosis not present

## 2020-12-02 DIAGNOSIS — I120 Hypertensive chronic kidney disease with stage 5 chronic kidney disease or end stage renal disease: Secondary | ICD-10-CM | POA: Diagnosis not present

## 2020-12-02 NOTE — Progress Notes (Signed)
Amy Moses, Amy Moses ([id number]) Visit Report for 12/02/2020 Abuse/Suicide Risk Screen Details Patient Name: Amy Moses, Amy Moses Date of Service: 12/02/2020 8:30 AM Medical Record Number: [id number] Patient Account Number: [id number] Date of Birth/Sex: 20-Apr-1971 (50 y.o. F) Treating RN: Dolan Amen Primary Care Kary Sugrue: Catalina Antigua Other Clinician: Referring Nanda Bittick: Ruta Hinds Treating Nailani Full/Extender: Tito Dine in Treatment: 0 Abuse/Suicide Risk Screen Items Answer ABUSE RISK SCREEN: Has anyone close to you tried to hurt or harm you recentlyo No Do you feel uncomfortable with anyone in your familyo No Has anyone forced you do things that you didnot want to doo No Electronic Signature(s) Signed: 12/02/2020 4:32:07 PM By: Georges Mouse, Minus Breeding RN Entered By: Georges Mouse, Kenia on 12/02/2020 08:57:31 Amy Moses ([id number]) -------------------------------------------------------------------------------- Activities of Daily Living Details Patient Name: Amy Moses Date of Service: 12/02/2020 8:30 AM Medical Record Number: [id number] Patient Account Number: [id number] Date of Birth/Sex: [birth year] (49 y.o. F) Treating RN: Dolan Amen Primary Care Hollyanne Schloesser: Catalina Antigua Other Clinician: Referring Merritt Kibby: Ruta Hinds Treating Jaydin Jalomo/Extender: Tito Dine in Treatment: 0 Activities of Daily Living Items Answer Activities of Daily Living (Please select one for each item) Drive Automobile Not Able Take Medications Completely Able Use Telephone Completely Able Care for Appearance Completely Able Use Toilet Completely Able Bath / Shower Completely Able Dress Self Completely Able Feed Self Completely Able Walk Completely Able Get In / Out Bed Completely Able Housework Completely Able Prepare Meals Completely [place] for Self Completely Able Electronic Signature(s) Signed: 12/02/2020  4:32:07 PM By: Georges Mouse, Minus Breeding RN Entered By: Georges Mouse, Minus Breeding on 12/02/2020 08:58:02 Amy Moses ([id number]) -------------------------------------------------------------------------------- Education Screening Details Patient Name: Amy Moses Date of Service: 12/02/2020 8:30 AM Medical Record Number: [id number] Patient Account Number: [id number] Date of Birth/Sex: 1970-11-24 (49 y.o. F) Treating RN: Dolan Amen Primary Care Razan Siler: Catalina Antigua Other Clinician: Referring Zenita Kister: Ruta Hinds Treating Maycie Luera/Extender: Tito Dine in Treatment: 0 Primary Learner Assessed: Patient Learning Preferences/Education Level/Primary Language Learning Preference: Explanation, Demonstration Highest Education Level: College or Above Preferred Language: English Cognitive Barrier Language Barrier: No Translator Needed: No Memory Deficit: No Emotional Barrier: No Cultural/Religious Beliefs Affecting Medical Care: No Physical Barrier Impaired Vision: Yes blind right eye Impaired Hearing: No Decreased Hand dexterity: No Knowledge/Comprehension Knowledge Level: High Comprehension Level: High Ability to understand written instructions: High Ability to understand verbal instructions: High Motivation Anxiety Level: Calm Cooperation: Cooperative Education Importance: Acknowledges Need Interest in Health Problems: Asks Questions Perception: Coherent Willingness to Engage in Self-Management High Activities: Readiness to Engage in Self-Management High Activities: Electronic Signature(s) Signed: 12/02/2020 4:32:07 PM By: Georges Mouse, Minus Breeding RN Entered By: Georges Mouse, Minus Breeding on 12/02/2020 08:58:50 Amy Moses ([id number]) -------------------------------------------------------------------------------- Fall Risk Assessment Details Patient Name: Amy Moses Date of Service: 12/02/2020 8:30 AM Medical Record Number:  [id number] Patient Account Number: [id number] Date of Birth/Sex: June 18, 1971 (49 y.o. F) Treating RN: Dolan Amen Primary Care Renita Brocks: Catalina Antigua Other Clinician: Referring Soriah Leeman: Ruta Hinds Treating Wael Maestas/Extender: Tito Dine in Treatment: 0 Fall Risk Assessment Items Have you had 2 or more falls in the last 12 monthso 0 Yes Have you had any fall that resulted in injury in the last 12 monthso 0 Yes FALLS RISK SCREEN History of falling - immediate or within 3 months 25 Yes Secondary diagnosis (Do you have 2 or more medical diagnoseso) 15 Yes Ambulatory aid None/bed rest/wheelchair/nurse 0 Yes Crutches/cane/walker 0 No Furniture 0 No Intravenous therapy Access/Saline/Heparin Lock 0 No Gait/Transferring Normal/ bed rest/ wheelchair  0 Yes Weak (short steps with or without shuffle, stooped but able to lift head while walking, may 0 No seek support from furniture) Impaired (short steps with shuffle, may have difficulty arising from chair, head down, impaired 0 No balance) Mental Status Oriented to own ability 0 Yes Electronic Signature(s) Signed: 12/02/2020 4:32:07 PM By: Georges Mouse, Minus Breeding RN Entered By: Georges Mouse, Kenia on 12/02/2020 08:59:31 Amy Moses ([id number]) -------------------------------------------------------------------------------- Foot Assessment Details Patient Name: Amy Moses Date of Service: 12/02/2020 8:30 AM Medical Record Number: [id number] Patient Account Number: [id number] Date of Birth/Sex: 04-09-1971 (49 y.o. F) Treating RN: Dolan Amen Primary Care Sayla Golonka: Catalina Antigua Other Clinician: Referring Elizette Shek: Ruta Hinds Treating Darshay Deupree/Extender: Tito Dine in Treatment: 0 Foot Assessment Items Site Locations + = Sensation present, - = Sensation absent, C = Callus, U = Ulcer R = Redness, W = Warmth, M = Maceration, PU = Pre-ulcerative lesion F = Fissure, S = Swelling, D =  Dryness Assessment Right: Left: Other Deformity: No No Prior Foot Ulcer: No No Prior Amputation: No No Charcot Joint: No No Ambulatory Status: Non-ambulatory Assistance Device: Wheelchair Gait: Administrator, arts) Signed: 12/02/2020 4:32:07 PM By: Georges Mouse, Minus Breeding RN Entered By: Georges Mouse, Minus Breeding on 12/02/2020 09:10:29 Amy Moses ([id number]) -------------------------------------------------------------------------------- Nutrition Risk Screening Details Patient Name: Amy Moses Date of Service: 12/02/2020 8:30 AM Medical Record Number: [id number] Patient Account Number: [id number] Date of Birth/Sex: Oct 30, 1971 (49 y.o. F) Treating RN: Dolan Amen Primary Care Remie Mathison: Catalina Antigua Other Clinician: Referring Nyquan Selbe: Ruta Hinds Treating Corleen Otwell/Extender: Tito Dine in Treatment: 0 Height (in): 66 Weight (lbs): 235 Body Mass Index (BMI): 37.9 Nutrition Risk Screening Items Score Screening NUTRITION RISK SCREEN: I have an illness or condition that made me change the kind and/or amount of food I eat 0 No I eat fewer than two meals per day 0 No I eat few fruits and vegetables, or milk products 0 No I have three or more drinks of beer, liquor or wine almost every day 0 No I have tooth or mouth problems that make it hard for me to eat 0 No I don't always have enough money to buy the food I need 0 No I eat alone most of the time 0 No I take three or more different prescribed or over-the-counter drugs a day 0 No Without wanting to, I have lost or gained 10 pounds in the last six months 0 No I am not always physically able to shop, cook and/or feed myself 0 No Nutrition Protocols Good Risk Protocol 0 No interventions needed Moderate Risk Protocol High Risk Proctocol Risk Level: Good Risk Score: 0 Electronic Signature(s) Signed: 12/02/2020 4:32:07 PM By: Georges Mouse, Minus Breeding RN Entered By: Georges Mouse, Minus Breeding on  12/02/2020 09:00:06

## 2020-12-05 NOTE — Progress Notes (Signed)
Amy Moses, Amy Moses ([id number]) Visit Report for 12/02/2020 Chief Complaint Document Details Patient Name: Amy Moses Date of Service: 12/02/2020 8:30 AM Medical Record Number: [id number] Patient Account Number: [id number] Date of Birth/Sex: 1971/06/16 (50 y.o. F) Treating RN: Cornell Barman Primary Care Provider: Catalina Antigua Other Clinician: Referring Provider: Ruta Hinds Treating Provider/Extender: Tito Dine in Treatment: 0 Information Obtained from: Patient Chief Complaint 12/02/2020; patient is here for review of extensive wounds on her bilateral thighs as well as an area on her right plantar heel Electronic Signature(s) Signed: 12/03/2020 12:50:01 PM By: Linton Ham MD Entered By: Linton Ham on 12/02/2020 09:59:50 Amy Moses ([id number]) -------------------------------------------------------------------------------- HPI Details Patient Name: Amy Moses Date of Service: 12/02/2020 8:30 AM Medical Record Number: [id number] Patient Account Number: [id number] Date of Birth/Sex: 1971-06-26 (49 y.o. F) Treating RN: Cornell Barman Primary Care Provider: Catalina Antigua Other Clinician: Referring Provider: Ruta Hinds Treating Provider/Extender: Tito Dine in Treatment: 0 History of Present Illness HPI Description: ADMISSION 12/02/2020 This is a 50 year old woman who is a type II diabetic. Although there are mentions of type 1 diabetes in epic clearly this woman is type II based on the fact that she was on oral agents for 10 years before starting insulin. She also is in chronic renal failure and recently started on dialysis I think in November. She relates her problems starting in September she started to develop skin excoriation on her bilateral inner thighs. She thought this was a friction phenomenon however the tissue wrist can progressively broke down. She was seen by her primary doctor on 10/21/2020 noted to have erythema on both  thighs medially and a blister. MRIs were ordered and she was put on Silvadene and gauze. She was seen in the ER on 12/12 at the [place] clinic also noted to have a right unstageable heel wound. 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 She dialyzes at [place] in [facility] and she was started on sodium thiosulfate as far as the notes state. The patient states she gets something at dialysis every day although she has not exactly sure what they are giving her. She was noted by vein and vascular in [place] to have multiple open wounds on 11/19/2020 using Xeroform gauze. She had an angiogram booked by Dr. Oneida Alar predominantly I think because of the right heel ulcer although this was canceled by the patient because of the weather. The patient has large necrotic wounds on both inner thighs with covering black eschar. There is also an area on the left lateral thigh and an eschared area on her right plantar heel. She has been using Betadine to the right heel Xeroform to the areas on her legs. The patient has Medicaid but is able to get the Xeroform, were not really sure how she is managing this although she lives in [place] and there may be different rules for Medicaid in [place] versus [place]. We certainly would not be able to get that here. 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 I wondered whether her neuropathy is particularly affected her sensation of pain since her recent left patella fracture does not seem to have been that painful either Past medical history includes stage V chronic renal failure starting on dialysis I think in November, clearly type 2 diabetes with neuropathy, hypertension, glaucoma, vitamin D deficiency, history of cholecystectomy, edema of both legs, hypothyroidism, she dialyzes Tuesday  Thursday and Saturday at [facility] in  [place] The patient had arterial studies on 11/19/2020 I think at vein and vascular in [place]. She had biphasic waveforms throughout the thigh on the right and at the popliteal proximally and distally in the ATA but the PTA and peroneal were not visualized. On the left again biphasic waveforms up to the level of the distal popliteal but not visualized in the tibial vessels. She was felt to have adequate flow where visualized. As noted she was supposed to have an angiogram which I think is certainly indicated Electronic Signature(s) Signed: 12/03/2020 12:50:01 PM By: Linton Ham MD Entered By: Linton Ham on 12/02/2020 10:06:48 Amy Moses ([id number]) -------------------------------------------------------------------------------- Physical Exam Details Patient Name: Amy Moses Date of Service: 12/02/2020 8:30 AM Medical Record Number: [id number] Patient Account Number: [id number] Date of Birth/Sex: 1971-11-11 (49 y.o. F) Treating RN: Cornell Barman Primary Care Provider: Catalina Antigua Other Clinician: Referring Provider: Ruta Hinds Treating Provider/Extender: Tito Dine in Treatment: 0 Constitutional Patient is hypertensive.. Pulse regular and within target range for patient.Marland Kitchen Respirations regular, non-labored and within target range.. Temperature is normal and within the target range for the patient.Marland Kitchen appears in no distress. Respiratory Respiratory effort is easy and symmetric bilaterally. Rate is normal at rest and on room air.. Cardiovascular Heart rhythm and rate regular, without murmur or gallop.. Gastrointestinal (GI) Soft no palpable abdominal aortic aneurysm. Integumentary (Hair, Skin) Skin is extremely dry on her feet and lower legs. Scaly thick. Notes Wound exam o She has extensive right greater than left inner thigh necrotic wounds covered with a thick black eschar. There is also area on her left lateral thigh also covered with a  black eschar. There is some tissue around this that is firm and clearly involved but with no open area. Strangely she has absolutely no pain in any of these areas o On the right heel large thick black eschar in the wound over the tip of the right heel Electronic Signature(s) Signed: 12/03/2020 12:50:01 PM By: Linton Ham MD Entered By: Linton Ham on 12/02/2020 10:08:45 Amy Moses ([id number]) -------------------------------------------------------------------------------- Physician Orders Details Patient Name: Amy Moses Date of Service: 12/02/2020 8:30 AM Medical Record Number: [id number] Patient Account Number: [id number] Date of Birth/Sex: [birth year] (50 y.o. F) Treating RN: Cornell Barman Primary Care Provider: Catalina Antigua Other Clinician: Referring Provider: Ruta Hinds Treating Provider/Extender: Tito Dine in Treatment: 0 Verbal / Phone Orders: No Diagnosis Coding Follow-up Appointments o Return Appointment in 2 weeks. Bathing/ Shower/ Hygiene o May shower; gently cleanse wound with antibacterial soap, rinse and pat dry prior to dressing wounds Additional Orders / Instructions o Follow Nutritious Diet and Increase Protein Intake Wound Treatment Wound #1 - Upper Leg Wound Laterality: Right, Medial Primary Dressing: Xeroform 5x9-HBD (in/in) (Generic) 1 x Per Day/30 Days Discharge Instructions: Apply Xeroform 5x9-HBD (in/in) as directed Secondary Dressing: Gauze (Generic) 1 x Per Day/30 Days Discharge Instructions: As directed: dry, moistened with saline or moistened with Dakins Solution Secured With: [employer] Sterile or Non-Sterile 6-ply 4.5x4 (yd/yd) (Generic) 1 x Per Day/30 Days Discharge Instructions: Apply Kerlix as directed Wound #2 - Upper Leg Wound Laterality: Left, Medial Primary Dressing: Xeroform 5x9-HBD (in/in) (Generic) 1 x Per Day/30 Days Discharge Instructions: Apply Xeroform 5x9-HBD (in/in) as directed Secondary  Dressing: Gauze (Generic) 1 x Per Day/30 Days Discharge Instructions: As directed: dry, moistened with saline or moistened with Dakins Solution Secured With: [employer] Sterile or Non-Sterile 6-ply 4.5x4 (yd/yd) (Generic) 1 x Per Day/30 Days Discharge Instructions:  Apply Kerlix as directed Wound #3 - Upper Leg Wound Laterality: Left, Lateral Primary Dressing: Xeroform 5x9-HBD (in/in) (Generic) 1 x Per Day/30 Days Discharge Instructions: Apply Xeroform 5x9-HBD (in/in) as directed Secondary Dressing: Gauze (Generic) 1 x Per Day/30 Days Discharge Instructions: As directed: dry, moistened with saline or moistened with Dakins Solution Secured With: [employer] Sterile or Non-Sterile 6-ply 4.5x4 (yd/yd) (Generic) 1 x Per Day/30 Days Discharge Instructions: Apply Kerlix as directed Wound #4 - Calcaneus Wound Laterality: Right Peri-Wound Care: Betadine-swabs Discharge Instructions: Apply Betadine as directed Secured With: Kerlix Roll Sterile or Non-Sterile 6-ply 4.5x4 (yd/yd) Discharge Instructions: Apply Kerlix as directed Add-Ons: ALLEVYN Heel 4 1/2in x 5 1/2in / 10.5cm x 13.5cm (Generic) Discharge Instructions: Apply the white, patterned surface to the heel. MONTZERRAT, BRUNELL ([id number]) Electronic Signature(s) Signed: 12/03/2020 12:50:01 PM By: Linton Ham MD Signed: 12/05/2020 10:48:52 AM By: Gretta Cool, BSN, RN, CWS, Kim RN, BSN Entered By: Gretta Cool, BSN, RN, CWS, Kim on 12/02/2020 09:54:27 Amy Moses ([id number]) -------------------------------------------------------------------------------- Problem List Details Patient Name: Amy Moses Date of Service: 12/02/2020 8:30 AM Medical Record Number: [id number] Patient Account Number: [id number] Date of Birth/Sex: 11-24-1970 (50 y.o. F) Treating RN: Cornell Barman Primary Care Provider: Catalina Antigua Other Clinician: Referring Provider: Ruta Hinds Treating Provider/Extender: Tito Dine in Treatment: 0 Active  Problems ICD-10 Encounter Code Description Active Date MDM Diagnosis E83.59 Other disorders of calcium metabolism 12/02/2020 No Yes L97.118 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 12/02/2020 No Yes L97.128 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 12/02/2020 No Yes L89.610 Pressure ulcer of right heel, unstageable 12/02/2020 No Yes E11.42 Type 2 diabetes mellitus with diabetic polyneuropathy 12/02/2020 No Yes Inactive Problems Resolved Problems Electronic Signature(s) Signed: 12/03/2020 12:50:01 PM By: Linton Ham MD Entered By: Linton Ham on 12/02/2020 09:59:15 Amy Moses ([id number]) -------------------------------------------------------------------------------- Progress Note Details Patient Name: Amy Moses Date of Service: 12/02/2020 8:30 AM Medical Record Number: [id number] Patient Account Number: [id number] Date of Birth/Sex: [birth year] (50 y.o. F) Treating RN: Cornell Barman Primary Care Provider: Catalina Antigua Other Clinician: Referring Provider: Ruta Hinds Treating Provider/Extender: Tito Dine in Treatment: 0 Subjective Chief Complaint Information obtained from Patient 12/02/2020; patient is here for review of extensive wounds on her bilateral thighs as well as an area on her right plantar heel History of Present Illness (HPI) ADMISSION 12/02/2020 This is a 50 year old woman who is a type II diabetic. Although there are mentions of type 1 diabetes in epic clearly this woman is type II based on the fact that she was on oral agents for 10 years before starting insulin. She also is in chronic renal failure and recently started on dialysis I think in November. She relates her problems starting in September she started to develop skin excoriation on her bilateral inner thighs. She thought this was a friction phenomenon however the tissue wrist can progressively broke down. She was seen by  her primary doctor on 10/21/2020 noted to have erythema on both thighs medially and a blister. MRIs were ordered and she was put on Silvadene and gauze. She was seen in the ER on 12/12 at the [place] clinic also noted to have a right unstageable heel wound. 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 She dialyzes at [facility] and she was started on sodium thiosulfate as far as the notes state. The patient states she gets something at dialysis every day although she has not  exactly sure what they are giving her. She was noted by vein and vascular in [place] to have multiple open wounds on 11/19/2020 using Xeroform gauze. She had an angiogram booked by Dr. Oneida Alar predominantly I think because of the right heel ulcer although this was canceled by the patient because of the weather. The patient has large necrotic wounds on both inner thighs with covering black eschar. There is also an area on the left lateral thigh and an eschared area on her right plantar heel. She has been using Betadine to the right heel Xeroform to the areas on her legs. The patient has Medicaid but is able to get the Xeroform, were not really sure how she is managing this although she lives in [place] and there may be different rules for Medicaid in [place] versus [place]. We certainly would not be able to get that here. 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 I wondered whether her neuropathy is particularly affected her sensation of pain since her recent left patella fracture does not seem to have been that painful either Past medical history includes stage V chronic renal failure starting on dialysis I think in November, clearly type 2 diabetes with neuropathy, hypertension, glaucoma, vitamin D deficiency, history of cholecystectomy, edema of both legs,  hypothyroidism, she dialyzes Tuesday Thursday and Saturday at [place] in [place] The patient had arterial studies on 11/19/2020 I think at vein and vascular in [place]. She had biphasic waveforms throughout the thigh on the right and at the popliteal proximally and distally in the ATA but the PTA and peroneal were not visualized. On the left again biphasic waveforms up to the level of the distal popliteal but not visualized in the tibial vessels. She was felt to have adequate flow where visualized. As noted she was supposed to have an angiogram which I think is certainly indicated Patient History Information obtained from Patient. Allergies No Known Allergies Family History Diabetes - Mother,Father, Heart Disease - Mother,Father, Hypertension - Mother,Father, Kidney Disease - Mother, Lung Disease - Father, No family history of Cancer, Hereditary Spherocytosis, Seizures, Stroke, Thyroid Problems, Tuberculosis. Social History Never smoker, Marital Status - Single, Alcohol Use - Never, Drug Use - No History, Caffeine Use - Rarely. Medical History Eyes Patient has history of Cataracts, Glaucoma Denies history of Optic Neuritis Ear/Nose/Mouth/Throat Denies history of Chronic sinus problems/congestion, Middle ear problems Hematologic/Lymphatic Patient has history of Anemia Denies history of Hemophilia, Human Immunodeficiency Virus, Lymphedema, Sickle Cell Disease Respiratory Denies history of Aspiration, Asthma, Chronic Obstructive Pulmonary Disease (COPD), Pneumothorax, Sleep Apnea, Tuberculosis Cardiovascular Patient has history of Hypertension Denies history of Angina, Arrhythmia, Congestive Heart Failure, Coronary Artery Disease, Deep Vein Thrombosis, Hypotension, Myocardial Infarction, Peripheral Arterial Disease, Peripheral Venous Disease, Phlebitis, Vasculitis Stice, Hazeline ([id number]) Gastrointestinal Denies history of Cirrhosis , Colitis, Crohn s, Hepatitis A, Hepatitis  B, Hepatitis C Endocrine Patient has history of Type II Diabetes Denies history of Type I Diabetes Genitourinary Patient has history of End Stage Renal Disease Immunological Denies history of Lupus Erythematosus, Raynaud s, Scleroderma Integumentary (Skin) Denies history of History of Burn, History of pressure wounds Musculoskeletal Denies history of Gout, Rheumatoid Arthritis, Osteoarthritis, Osteomyelitis Neurologic Patient has history of Neuropathy Denies history of Dementia, Quadriplegia, Paraplegia, Seizure Disorder Oncologic Denies history of Received Chemotherapy, Received Radiation Psychiatric Denies history of Anorexia/bulimia, Confinement Anxiety Patient is treated with Insulin. Blood sugar is tested. Blood sugar results noted at the following times:  Breakfast - 130, Bedtime - 220. Review of Systems (ROS) Constitutional Symptoms (General Health) Denies complaints or symptoms of Fatigue, Fever, Chills, Marked Weight Change. Eyes Complains or has symptoms of Vision Changes - right eye-blind. Denies complaints or symptoms of Dry Eyes, Glasses / Contacts. Ear/Nose/Mouth/Throat Denies complaints or symptoms of Difficult clearing ears, Sinusitis. Hematologic/Lymphatic Denies complaints or symptoms of Bleeding / Clotting Disorders, Human Immunodeficiency Virus. Respiratory Denies complaints or symptoms of Chronic or frequent coughs, Shortness of Breath. Cardiovascular Denies complaints or symptoms of Chest pain, LE edema. Gastrointestinal Denies complaints or symptoms of Frequent diarrhea, Nausea, Vomiting. Endocrine Denies complaints or symptoms of Hepatitis, Thyroid disease, Polydypsia (Excessive Thirst). Genitourinary Complains or has symptoms of Kidney failure/ Dialysis. Denies complaints or symptoms of Incontinence/dribbling. Immunological Denies complaints or symptoms of Hives, Itching. Integumentary (Skin) Complains or has symptoms of Wounds, Breakdown,  Swelling. Denies complaints or symptoms of Bleeding or bruising tendency. Musculoskeletal Complains or has symptoms of Muscle Pain, Muscle Weakness, fractured patella-left Neurologic Denies complaints or symptoms of Numbness/parasthesias, Focal/Weakness. Psychiatric Denies complaints or symptoms of Anxiety, Claustrophobia. Objective Constitutional Patient is hypertensive.. Pulse regular and within target range for patient.Marland Kitchen Respirations regular, non-labored and within target range.. Temperature is normal and within the target range for the patient.Marland Kitchen appears in no distress. Vitals Time Taken: 8:45 AM, Height: 66 in, Source: Stated, Weight: 235 lbs, Source: Stated, BMI: 37.9, Temperature: 98.9 F, Pulse: 77 bpm, Respiratory Rate: 16 breaths/min, Blood Pressure: 174/109 mmHg. Respiratory Respiratory effort is easy and symmetric bilaterally. Rate is normal at rest and on room air.. Cardiovascular Timberman, Aliyanna ([id number]) Heart rhythm and rate regular, without murmur or gallop.. Gastrointestinal (GI) Soft no palpable abdominal aortic aneurysm. General Notes: Wound exam She has extensive right greater than left inner thigh necrotic wounds covered with a thick black eschar. There is also area on her left lateral thigh also covered with a black eschar. There is some tissue around this that is firm and clearly involved but with no open area. Strangely she has absolutely no pain in any of these areas On the right heel large thick black eschar in the wound over the tip of the right heel Integumentary (Hair, Skin) Skin is extremely dry on her feet and lower legs. Scaly thick. Wound #1 status is Open. Original cause of wound was Gradually Appeared. The wound is located on the Right,Medial Upper Leg. The wound measures 13cm length x 22cm width x 0.1cm depth; 224.624cm^2 area and 22.462cm^3 volume. There is Fat Layer (Subcutaneous Tissue) exposed. There is no tunneling or undermining noted. There  is no granulation within the wound bed. There is a large (67-100%) amount of necrotic tissue within the wound bed including Eschar and Adherent Slough. Wound #2 status is Open. Original cause of wound was Gradually Appeared. The wound is located on the Left,Medial Upper Leg. The wound measures 13cm length x 12cm width x 0.1cm depth; 122.522cm^2 area and 12.252cm^3 volume. There is Fat Layer (Subcutaneous Tissue) exposed. There is no tunneling or undermining noted. There is no granulation within the wound bed. There is a large (67-100%) amount of necrotic tissue within the wound bed including Eschar and Adherent Slough. Wound #3 status is Open. Original cause of wound was Gradually Appeared. The wound is located on the Left,Lateral Upper Leg. The wound measures 4cm length x 8cm width x 0.1cm depth; 25.133cm^2 area and 2.513cm^3 volume. There is Fat Layer (Subcutaneous Tissue) exposed. There is no tunneling or undermining noted. There is no granulation within the wound bed.  There is a large (67-100%) amount of necrotic tissue within the wound bed including Eschar and Adherent Slough. Wound #4 status is Open. Original cause of wound was Gradually Appeared. The wound is located on the Right Calcaneus. The wound measures 6cm length x 6cm width x 0.1cm depth; 28.274cm^2 area and 2.827cm^3 volume. There is Fat Layer (Subcutaneous Tissue) exposed. There is no tunneling or undermining noted. There is a none present amount of drainage noted. There is no granulation within the wound bed. There is a large (67-100%) amount of necrotic tissue within the wound bed including Eschar. 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 Bathing/ Shower/ Hygiene: May shower; gently cleanse wound with antibacterial soap, rinse and pat dry prior to dressing wounds Additional Orders / Instructions: Follow Nutritious Diet and Increase Protein Intake WOUND #1: - Upper Leg Wound Laterality: Right, Medial Primary Dressing: Xeroform 5x9-HBD (in/in) (Generic) 1 x Per Day/30 Days Discharge Instructions: Apply Xeroform 5x9-HBD (in/in) as directed Secondary Dressing: Gauze (Generic) 1 x Per Day/30 Days Discharge Instructions: As directed: dry, moistened with saline or moistened with Dakins Solution Secured With: [employer] Sterile or Non-Sterile 6-ply 4.5x4 (yd/yd) (Generic) 1 x Per Day/30 Days Discharge Instructions: Apply Kerlix as directed WOUND #2: - Upper Leg Wound Laterality: Left, Medial Primary Dressing: Xeroform 5x9-HBD (in/in) (Generic) 1 x Per Day/30 Days Discharge Instructions: Apply Xeroform 5x9-HBD (in/in) as directed Secondary Dressing: Gauze (Generic) 1 x Per Day/30 Days Discharge Instructions: As directed: dry, moistened with saline or moistened with Dakins Solution Secured With: [employer] Sterile or Non-Sterile 6-ply 4.5x4 (yd/yd) (Generic) 1 x Per Day/30 Days Discharge Instructions: Apply Kerlix as directed WOUND #3: - Upper Leg Wound Laterality: Left, Lateral Primary Dressing: Xeroform 5x9-HBD (in/in) (Generic) 1 x Per Day/30 Days CHANDANI, ROGOWSKI ([id number]) Discharge Instructions: Apply Xeroform 5x9-HBD (in/in) as directed Secondary Dressing: Gauze (Generic) 1 x Per Day/30 Days Discharge Instructions: As directed: dry, moistened with saline or moistened with Dakins Solution Secured With: [employer] Sterile or Non-Sterile 6-ply 4.5x4 (yd/yd) (Generic) 1 x Per Day/30 Days Discharge Instructions: Apply Kerlix as directed WOUND #4: - Calcaneus Wound Laterality: Right Peri-Wound Care: Betadine-swabs Discharge Instructions: Apply Betadine as directed Secured  With: Kerlix Roll Sterile or Non-Sterile 6-ply 4.5x4 (yd/yd) Discharge Instructions: Apply Kerlix as directed Add-Ons: ALLEVYN Heel 4 1/2in x 5 1/2in / 10.5cm x 13.5cm (Generic) Discharge Instructions: Apply the white, patterned surface to the heel. 1. Although the wounds on her thighs certainly look like calciphylaxis and the involvement of the tissue around some of these areas is also quite suggestive the absence of pain at least in my experience is very atypical. I wondered whether her neuropathy precludes pain sensation. She has apparently started on thiosulfate at dialysis which I think is reasonable. 2. The patient has Xeroform through Medicaid in [place] and she apparently has a DME supplier. We will contact them to see what she might be eligible for. 3. The issue from a pure wound care point of view is that these areas will eventually need debridement. I am not willing to do this here currently and I wonder when she has repair of her left patella fracture whether they might be prepared to do that during the same operative. Apparently she is seeing orthopedics  in [place] 4. Apparently a left patella fracture although she does not complain of a lot of pain here. This was after a fall 5. I agree with the heel Betadine which she is using. 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 I think she does need an angiogram and I have asked her to call vein and vascular to set this up again although I am worried in the incur an environment that might take a long period of time. I also want to make absolutely sure that the flow in her upper thighs is adequate 6. Until we talk with her DME supplier in [place] I have continued with the [employer]. They are already asking about how often they are going to have to come here, apparently the wound care center in [place] could not handle her for some reason and  they could not get an appointment and even. I am not sure exactly what the issue is 7. 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 Nevertheless I did not think we needed a biopsy at this point. Eventually these areas are going to need debridement both mechanically and with topical dressings I spent 45 minutes in review of this patient's past medical history, preparation of this record and face-to-face evaluation Electronic Signature(s) Signed: 12/03/2020 12:50:01 PM By: Linton Ham MD Entered By: Linton Ham on 12/02/2020 10:14:17 Amy Moses ([id number]) -------------------------------------------------------------------------------- ROS/PFSH Details Patient Name: Amy Moses Date of Service: 12/02/2020 8:30 AM Medical Record Number: [id number] Patient Account Number: [id number] Date of Birth/Sex: Jun 12, 1971 (49 y.o. F) Treating RN: Dolan Amen Primary Care Provider: Catalina Antigua Other Clinician: Referring Provider: Ruta Hinds Treating Provider/Extender: Tito Dine in Treatment: 0 Information Obtained From Patient Constitutional Symptoms (General Health) Complaints and Symptoms: Negative for: Fatigue; Fever; Chills; Marked Weight Change Eyes Complaints and Symptoms: Positive for: Vision Changes - right eye-blind Negative for: Dry Eyes; Glasses / Contacts Medical History: Positive for: Cataracts; Glaucoma Negative for: Optic Neuritis Ear/Nose/Mouth/Throat Complaints and Symptoms: Negative for: Difficult clearing ears; Sinusitis Medical History: Negative for: Chronic sinus problems/congestion; Middle ear problems Hematologic/Lymphatic Complaints and Symptoms: Negative for: Bleeding / Clotting Disorders; Human Immunodeficiency Virus Medical History: Positive for: Anemia Negative for: Hemophilia; Human Immunodeficiency Virus; Lymphedema; Sickle Cell Disease Respiratory Complaints and  Symptoms: Negative for: Chronic or frequent coughs; Shortness of Breath Medical History: Negative for: Aspiration; Asthma; Chronic Obstructive Pulmonary Disease (COPD); Pneumothorax; Sleep Apnea; Tuberculosis Cardiovascular Complaints and Symptoms: Negative for: Chest pain; LE edema Medical History: Positive for: Hypertension Negative for: Angina; Arrhythmia; Congestive Heart Failure; Coronary Artery Disease; Deep Vein Thrombosis; Hypotension; Myocardial Infarction; Peripheral Arterial Disease; Peripheral Venous Disease; Phlebitis; Vasculitis Gastrointestinal Complaints and Symptoms: Negative for: Frequent diarrhea; Nausea; Vomiting Medical History: Negative for: Cirrhosis ; Colitis; Crohnos; Hepatitis A; Hepatitis B; Hepatitis C Lauder, Shanisha ([id number]) Endocrine Complaints and Symptoms: Negative for: Hepatitis; Thyroid disease; Polydypsia (Excessive Thirst) Medical History: Positive for: Type II Diabetes Negative for: Type I Diabetes Time with diabetes: 27 years Treated with: Insulin Blood sugar tested every day: Yes Tested : 2-3 times a day Blood sugar testing results: Breakfast: 130; Bedtime: 220 Genitourinary Complaints and Symptoms: Positive for: Kidney failure/ Dialysis Negative for: Incontinence/dribbling Medical History: Positive for: End Stage Renal Disease Immunological Complaints and Symptoms: Negative for: Hives; Itching Medical History: Negative for: Lupus Erythematosus; Raynaudos; Scleroderma Integumentary (Skin) Complaints and Symptoms: Positive for: Wounds; Breakdown; Swelling Negative for: Bleeding  or bruising tendency Medical History: Negative for: History of Burn; History of pressure wounds Musculoskeletal Complaints and Symptoms: Positive for: Muscle Pain; Muscle Weakness Review of System Notes: fractured patella-left Medical History: Negative for: Gout; Rheumatoid Arthritis; Osteoarthritis; Osteomyelitis Neurologic Complaints and  Symptoms: Negative for: Numbness/parasthesias; Focal/Weakness Medical History: Positive for: Neuropathy Negative for: Dementia; Quadriplegia; Paraplegia; Seizure Disorder Psychiatric Complaints and Symptoms: Negative for: Anxiety; Claustrophobia Medical History: Negative for: Anorexia/bulimia; Confinement Anxiety Oncologic Medical History: Negative for: Received Chemotherapy; Received Radiation SHALEA, TOMCZAK ([id number]) HBO Extended History Items Eyes: Eyes: Cataracts Glaucoma Immunizations Pneumococcal Vaccine: Received Pneumococcal Vaccination: No Implantable Devices None Family and Social History Cancer: No; Diabetes: Yes - Mother,Father; Heart Disease: Yes - Mother,Father; Hereditary Spherocytosis: No; Hypertension: Yes - Mother,Father; Kidney Disease: Yes - Mother; Lung Disease: Yes - Father; Seizures: No; Stroke: No; Thyroid Problems: No; Tuberculosis: No; Never smoker; Marital Status - Single; Alcohol Use: Never; Drug Use: No History; Caffeine Use: Rarely Electronic Signature(s) Signed: 12/02/2020 4:32:07 PM By: Georges Mouse, Minus Breeding RN Signed: 12/03/2020 12:50:01 PM By: Linton Ham MD Entered By: Georges Mouse, Minus Breeding on 12/02/2020 08:57:23 Amy Moses ([id number]) -------------------------------------------------------------------------------- SuperBill Details Patient Name: Amy Moses Date of Service: 12/02/2020 Medical Record Number: [id number] Patient Account Number: [id number] Date of Birth/Sex: 03-21-1971 (50 y.o. 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-pressure chronic ulcer of left thigh with other specified  severity L89.610 Pressure ulcer of right heel, unstageable E11.42 Type 2 diabetes mellitus with diabetic polyneuropathy Facility Procedures CPT4 Code: 79024097 Description: 35329 - WOUND CARE VISIT-LEV 5 EST PT Modifier: Quantity: 1 Physician Procedures CPT4 Code: [id number] Description: 41962 - WC PHYS LEVEL 4 - NEW PT Modifier: Quantity: 1 CPT4 Code: Description: ICD-10 Diagnosis Description 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

## 2020-12-05 NOTE — Progress Notes (Signed)
ROSELLE, NORTON ([id number]) Visit Report for 12/02/2020 Allergy List Details Patient Name: MARYNELL, BIES Date of Service: 12/02/2020 8:30 AM Medical Record Number: [id number] Patient Account Number: [id number] Date of Birth/Sex: August 27, 1971 (50 y.o. F) Treating RN: Dolan Amen Primary Care Gradie Butrick: Catalina Antigua Other Clinician: Referring Seila Liston: Ruta Hinds Treating Shantanique Hodo/Extender: Ricard Dillon Weeks in Treatment: 0 Allergies Active Allergies No Known Allergies Type: Allergen Allergy Notes Electronic Signature(s) Signed: 12/02/2020 4:32:07 PM By: Georges Mouse, Minus Breeding RN Entered By: Georges Mouse, Minus Breeding on 12/02/2020 08:47:02 [name] ([id number]) -------------------------------------------------------------------------------- Arrival Information Details Patient Name: [name] Date of Service: 12/02/2020 8:30 AM Medical Record Number: [id number] Patient Account Number: [id number] Date of Birth/Sex: September 20, 1971 (49 y.o. F) Treating RN: Dolan Amen Primary Care Reinette Cuneo: Catalina Antigua Other Clinician: Referring Corinthian Kemler: Ruta Hinds Treating Morna Flud/Extender: Tito Dine in Treatment: 0 Visit Information Patient Arrived: Wheel Chair Arrival Time: 08:45 Accompanied By: daughter Transfer Assistance: EasyPivot Patient Lift Patient Identification Verified: Yes Secondary Verification Process Completed: Yes Electronic Signature(s) Signed: 12/02/2020 4:32:07 PM By: Georges Mouse, Minus Breeding RN Entered By: Georges Mouse, Minus Breeding on 12/02/2020 08:45:41 [name] ([id number]) -------------------------------------------------------------------------------- Clinic Level of Care Assessment Details Patient Name: [name] Date of Service: 12/02/2020 8:30 AM Medical Record Number: [id number] Patient Account Number: [id number] Date of Birth/Sex: 03/16/1971 (49 y.o. F) Treating RN: Cornell Barman Primary Care Spike Desilets: Catalina Antigua Other Clinician: Referring Scottie Stanish: Ruta Hinds Treating Isay Perleberg/Extender: Tito Dine in Treatment: 0 Clinic Level of Care Assessment Items TOOL 2 Quantity Score []  - Use when only an EandM is performed on the INITIAL visit 0 ASSESSMENTS - Nursing Assessment / Reassessment X - General Physical Exam (combine w/ comprehensive assessment (listed just below) when performed on new 1 20 pt. evals) X- 1 25 Comprehensive Assessment (HX, ROS, Risk Assessments, Wounds Hx, etc.) ASSESSMENTS - Wound and Skin Assessment / Reassessment []  - Simple Wound Assessment / Reassessment - one wound 0 X- 4 5 Complex Wound Assessment / Reassessment - multiple wounds []  - 0 Dermatologic / Skin Assessment (not related to wound area) ASSESSMENTS - Ostomy and/or Continence Assessment and Care []  - Incontinence Assessment and Management 0 []  - 0 Ostomy Care Assessment and Management (repouching, etc.) PROCESS - Coordination of Care X - Simple Patient / Family Education for ongoing care 1 15 []  - 0 Complex (extensive) Patient / Family Education for ongoing care X- 1 10 Staff obtains Programmer, systems, Records, Test Results / Process Orders []  - 0 Staff telephones HHA, Nursing Homes / Clarify orders / etc []  - 0 Routine Transfer to another Facility (non-emergent condition) []  - 0 Routine Hospital Admission (non-emergent condition) X- 1 15 New Admissions / Biomedical engineer / Ordering NPWT, Apligraf, etc. []  - 0 Emergency Hospital Admission (emergent condition) X- 1 10 Simple Discharge Coordination []  - 0 Complex (extensive) Discharge Coordination PROCESS - Special Needs []  - Pediatric / Minor Patient Management 0 []  - 0 Isolation Patient Management []  - 0 Hearing / Language / Visual special needs []  - 0 Assessment of Community assistance (transportation, D/C planning, etc.) []  - 0 Additional assistance / Altered mentation []  - 0 Support Surface(s) Assessment (bed,  cushion, seat, etc.) INTERVENTIONS - Wound Cleansing / Measurement X - Wound Imaging (photographs - any number of wounds) 1 5 []  - 0 Wound Tracing (instead of photographs) []  - 0 Simple Wound Measurement - one wound X- 4 5 Complex Wound Measurement - multiple wounds Jaster, Etola ([id number]) []  - 0 Simple Wound Cleansing - one wound X- 4  5 Complex Wound Cleansing - multiple wounds INTERVENTIONS - Wound Dressings []  - Small Wound Dressing one or multiple wounds 0 X- 1 15 Medium Wound Dressing one or multiple wounds X- 3 20 Large Wound Dressing one or multiple wounds []  - 0 Application of Medications - injection INTERVENTIONS - Miscellaneous []  - External ear exam 0 []  - 0 Specimen Collection (cultures, biopsies, blood, body fluids, etc.) []  - 0 Specimen(s) / Culture(s) sent or taken to Lab for analysis []  - 0 Patient Transfer (multiple staff / Civil Service fast streamer / Similar devices) []  - 0 Simple Staple / Suture removal (25 or less) []  - 0 Complex Staple / Suture removal (26 or more) []  - 0 Hypo / Hyperglycemic Management (close monitor of Blood Glucose) []  - 0 Ankle / Brachial Index (ABI) - do not check if billed separately Has the patient been seen at the hospital within the last three years: Yes Total Score: 235 Level Of Care: New/Established - Level 5 Electronic Signature(s) Signed: 12/05/2020 10:48:52 AM By: Gretta Cool, BSN, RN, CWS, Kim RN, BSN Entered By: Gretta Cool, BSN, RN, CWS, Kim on 12/02/2020 10:01:37 [name] ([id number]) -------------------------------------------------------------------------------- Encounter Discharge Information Details Patient Name: [name] Date of Service: 12/02/2020 8:30 AM Medical Record Number: [id number] Patient Account Number: [id number] Date of Birth/Sex: 1971-06-14 (49 y.o. F) Treating RN: Cornell Barman Primary Care Brynlynn Walko: Catalina Antigua Other Clinician: Referring Erial Fikes: Ruta Hinds Treating Pamella Samons/Extender:  Tito Dine in Treatment: 0 Encounter Discharge Information Items Discharge Condition: Stable Ambulatory Status: Wheelchair Discharge Destination: Home Transportation: Private Auto Accompanied By: self Schedule Follow-up Appointment: Yes Clinical Summary of Care: Electronic Signature(s) Signed: 12/02/2020 10:03:51 AM By: Gretta Cool, BSN, RN, CWS, Kim RN, BSN Entered By: Gretta Cool, BSN, RN, CWS, Kim on 12/02/2020 10:03:50 [name] ([id number]) -------------------------------------------------------------------------------- Lower Extremity Assessment Details Patient Name: [name] Date of Service: 12/02/2020 8:30 AM Medical Record Number: [id number] Patient Account Number: [id number] Date of Birth/Sex: 1970/12/26 (49 y.o. F) Treating RN: Dolan Amen Primary Care Tamaya Pun: Catalina Antigua Other Clinician: Referring Venecia Mehl: Ruta Hinds Treating Edvardo Honse/Extender: Ricard Dillon Weeks in Treatment: 0 Edema Assessment Assessed: [Left: Yes] [Right: Yes] Edema: [Left: Yes] [Right: Yes] Calf Left: Right: Point of Measurement: 35 cm From Medial Instep 37.5 cm 38.5 cm Ankle Left: Right: Point of Measurement: 10 cm From Medial Instep 26 cm 25.3 cm Electronic Signature(s) Signed: 12/02/2020 4:32:07 PM By: Georges Mouse, Minus Breeding RN Entered By: Georges Mouse, Minus Breeding on 12/02/2020 09:16:50 [name] ([id number]) -------------------------------------------------------------------------------- Multi Wound Chart Details Patient Name: [name] Date of Service: 12/02/2020 8:30 AM Medical Record Number: [id number] Patient Account Number: [id number] Date of Birth/Sex: 01-11-1971 (49 y.o. F) Treating RN: Cornell Barman Primary Care Ryelee Albee: Catalina Antigua Other Clinician: Referring Egan Berkheimer: Ruta Hinds Treating Cherl Gorney/Extender: Tito Dine in Treatment: 0 Vital Signs Height(in): 66 Pulse(bpm): 54 Weight(lbs): 235 Blood  Pressure(mmHg): 174/109 Body Mass Index(BMI): 38 Temperature(F): 98.9 Respiratory Rate(breaths/min): 16 Photos: Wound Location: Right, Medial Upper Leg Left, Medial Upper Leg Left, Lateral Upper Leg Wounding Event: Gradually Appeared Gradually Appeared Gradually Appeared Primary Etiology: Calciphylaxis Calciphylaxis Calciphylaxis Comorbid History: Cataracts, Glaucoma, Anemia, Cataracts, Glaucoma, Anemia, Cataracts, Glaucoma, Anemia, Hypertension, Type II Diabetes, End Hypertension, Type II Diabetes, End Hypertension, Type II Diabetes, End Stage Renal Disease, Neuropathy Stage Renal Disease, Neuropathy Stage Renal Disease, Neuropathy Date Acquired: 07/22/2020 07/22/2020 07/22/2020 Weeks of Treatment: 0 0 0 Wound Status: Open Open Open Measurements L x W x D (cm) 13x22x0.1 13x12x0.1 4x8x0.1 Area (cm) : 224.624 122.522 25.133 Volume (cm) : 22.462 12.252  2.513 % Reduction in Area: 0.00% 0.00% 0.00% % Reduction in Volume: 0.00% 0.00% 0.00% Classification: Full Thickness Without Exposed Full Thickness Without Exposed Full Thickness Without Exposed Support Structures Support Structures Support Structures Exudate Amount: N/A N/A N/A Granulation Amount: None Present (0%) None Present (0%) None Present (0%) Necrotic Amount: Large (67-100%) Large (67-100%) Large (67-100%) Necrotic Tissue: Eschar, Adherent [place] Exposed Structures: Fat Layer (Subcutaneous Tissue): Fat Layer (Subcutaneous Tissue): Fat Layer (Subcutaneous Tissue): Yes Yes Yes Fascia: No Fascia: No Fascia: No Tendon: No Tendon: No Tendon: No Muscle: No Muscle: No Muscle: No Joint: No Joint: No Joint: No Bone: No Bone: No Bone: No Epithelialization: None None None Wound Number: 4 N/A N/A Photos: N/A N/A Wound Location: Right Calcaneus N/A N/A Wounding Event: Gradually Appeared N/A N/A MALYN, AYTES ([id number]) Primary Etiology: Pressure Ulcer N/A N/A Comorbid  History: Cataracts, Glaucoma, Anemia, N/A N/A Hypertension, Type II Diabetes, End Stage Renal Disease, Neuropathy Date Acquired: 10/21/2020 N/A N/A Weeks of Treatment: 0 N/A N/A Wound Status: Open N/A N/A Measurements L x W x D (cm) 6x6x0.1 N/A N/A Area (cm) : 28.274 N/A N/A Volume (cm) : 2.827 N/A N/A % Reduction in Area: 0.00% N/A N/A % Reduction in Volume: 0.00% N/A N/A Classification: Unstageable/Unclassified N/A N/A Exudate Amount: None Present N/A N/A Granulation Amount: None Present (0%) N/A N/A Necrotic Amount: Large (67-100%) N/A N/A Necrotic Tissue: Eschar N/A N/A Exposed Structures: Fat Layer (Subcutaneous Tissue): N/A N/A Yes Fascia: No Tendon: No Muscle: No Joint: No Bone: No Epithelialization: None N/A N/A Treatment Notes Electronic Signature(s) Signed: 12/03/2020 12:50:01 PM By: Linton Ham MD Entered By: Linton Ham on 12/02/2020 09:59:22 [name] ([id number]) -------------------------------------------------------------------------------- Multi-Disciplinary Care Plan Details Patient Name: [name] Date of Service: 12/02/2020 8:30 AM Medical Record Number: [id number] Patient Account Number: [id number] Date of Birth/Sex: 23-Jul-1971 (50 y.o. F) Treating RN: Cornell Barman Primary Care Lady Wisham: Catalina Antigua Other Clinician: Referring Eyleen Rawlinson: Ruta Hinds Treating Mayan Kloepfer/Extender: Tito Dine in Treatment: 0 Active Inactive Abuse / Safety / Falls / Self Care Management Nursing Diagnoses: Impaired physical mobility Potential for falls Goals: Patient will not develop complications from immobility Date Initiated: 12/02/2020 Target Resolution Date: 01/02/2021 Goal Status: Active Patient/caregiver will verbalize understanding of skin care regimen Date Initiated: 12/02/2020 Target Resolution Date: 01/02/2021 Goal Status: Active Interventions: Assess fall risk on admission and as needed Provide education on safe  transfers Notes: Necrotic Tissue Nursing Diagnoses: Impaired tissue integrity related to necrotic/devitalized tissue Goals: Necrotic/devitalized tissue will be minimized in the wound bed Date Initiated: 12/02/2020 Target Resolution Date: 01/02/2021 Goal Status: Active Patient/caregiver will verbalize understanding of reason and process for debridement of necrotic tissue Date Initiated: 12/02/2020 Target Resolution Date: 01/02/2021 Goal Status: Active Interventions: Assess patient pain level pre-, during and post procedure and prior to discharge Provide education on necrotic tissue and debridement process Notes: Nutrition Nursing Diagnoses: Impaired glucose control: actual or potential Goals: Patient/caregiver agrees to and verbalizes understanding of need to obtain nutritional consultation Date Initiated: 12/02/2020 Target Resolution Date: 01/02/2021 Goal Status: Active Patient/caregiver will maintain therapeutic glucose control Date Initiated: 12/02/2020 Target Resolution Date: 01/02/2021 Goal Status: Active Interventions: Provide education on elevated blood sugars and impact on wound healing TUNYA, HELD ([id number]) Notes: Orientation to the Wound Care Program Nursing Diagnoses: Knowledge deficit related to the wound healing center program Goals: Patient/caregiver will verbalize understanding of the [place] Date Initiated: 12/02/2020 Target Resolution Date: 01/02/2021 Goal Status: Active Interventions: Provide education on orientation  to the wound center Notes: Peripheral Neuropathy Nursing Diagnoses: Knowledge deficit related to disease process and management of peripheral neurovascular dysfunction Potential alteration in peripheral tissue perfusion (select prior to confirmation of diagnosis) Goals: Patient/caregiver will verbalize understanding of disease process and disease management Date Initiated: 12/02/2020 Target Resolution Date:  01/02/2021 Goal Status: Active Interventions: Assess signs and symptoms of neuropathy upon admission and as needed Provide education on Management of Neuropathy and Related Ulcers Notes: Wound/Skin Impairment Nursing Diagnoses: Impaired tissue integrity Goals: Patient/caregiver will verbalize understanding of skin care regimen Date Initiated: 12/02/2020 Target Resolution Date: 01/02/2021 Goal Status: Active Ulcer/skin breakdown will have a volume reduction of 30% by week 4 Date Initiated: 12/02/2020 Target Resolution Date: 01/02/2021 Goal Status: Active Interventions: Assess ulceration(s) every visit Treatment Activities: Referred to DME Anakin Varkey for dressing supplies : 12/02/2020 Skin care regimen initiated : 12/02/2020 Topical wound management initiated : 12/02/2020 Notes: Electronic Signature(s) Signed: 12/05/2020 10:48:52 AM By: Gretta Cool, BSN, RN, CWS, Kim RN, BSN Entered By: Gretta Cool, BSN, RN, CWS, Kim on 12/02/2020 09:44:25 [name] ([id number]) -------------------------------------------------------------------------------- Pain Assessment Details Patient Name: [name] Date of Service: 12/02/2020 8:30 AM Medical Record Number: [id number] Patient Account Number: [id number] Date of Birth/Sex: 07/04/1971 (49 y.o. F) Treating RN: Dolan Amen Primary Care Kamry Faraci: Catalina Antigua Other Clinician: Referring Freddie Nghiem: Ruta Hinds Treating Shawne Eskelson/Extender: Tito Dine in Treatment: 0 Active Problems Location of Pain Severity and Description of Pain Patient Has Paino No Site Locations Rate the pain. Current Pain Level: 0 Pain Management and Medication Current Pain Management: Electronic Signature(s) Signed: 12/02/2020 4:32:07 PM By: Georges Mouse, Minus Breeding RN Entered By: Georges Mouse, Minus Breeding on 12/02/2020 08:45:53 [name] ([id number]) -------------------------------------------------------------------------------- Patient/Caregiver  Education Details Patient Name: [name] Date of Service: 12/02/2020 8:30 AM Medical Record Number: [id number] Patient Account Number: [id number] Date of Birth/Gender: [birth year] (50 y.o. F) Treating RN: Cornell Barman Primary Care Physician: Catalina Antigua Other Clinician: Referring Physician: Ruta Hinds Treating Physician/Extender: Tito Dine in Treatment: 0 Education Assessment Education Provided To: Patient Education Topics Provided Peripheral Neuropathy: Handouts: Diabetes, Neuropathy Methods: Explain/Verbal Responses: State content correctly Welcome To [facility]: Handouts: Welcome To [facility] Methods: Demonstration, Explain/Verbal Responses: State content correctly Wound/Skin Impairment: Handouts: Caring for Your Ulcer Methods: Demonstration, Explain/Verbal Responses: State content correctly Electronic Signature(s) Signed: 12/05/2020 10:48:52 AM By: Gretta Cool, BSN, RN, CWS, Kim RN, BSN Entered By: Gretta Cool, BSN, RN, CWS, Kim on 12/02/2020 10:02:19 [name] ([id number]) -------------------------------------------------------------------------------- Wound Assessment Details Patient Name: [name] Date of Service: 12/02/2020 8:30 AM Medical Record Number: [id number] Patient Account Number: [id number] Date of Birth/Sex: 1971-08-01 (49 y.o. F) Treating RN: Dolan Amen Primary Care Alexias Margerum: Catalina Antigua Other Clinician: Referring Aimie Wagman: Ruta Hinds Treating Shawnae Leiva/Extender: Tito Dine in Treatment: 0 Wound Status Wound Number: 1 Primary Calciphylaxis Etiology: Wound Location: Right, Medial Upper Leg Wound Open Wounding Event: Gradually Appeared Status: Date Acquired: 07/22/2020 Comorbid Cataracts, Glaucoma, Anemia, Hypertension, Type II Weeks Of Treatment: 0 History: Diabetes, End Stage Renal Disease, Neuropathy Clustered Wound: No Photos Photo Uploaded By: Georges Mouse, Minus Breeding on  12/02/2020 09:27:12 Wound Measurements Length: (cm) 13 Width: (cm) 22 Depth: (cm) 0.1 Area: (cm) 224.624 Volume: (cm) 22.462 % Reduction in Area: 0% % Reduction in Volume: 0% Epithelialization: None Tunneling: No Undermining: No Wound Description Classification: Full Thickness Without Exposed Support Structure s Foul Odor After Cleansing: No Slough/Fibrino Yes Wound Bed Granulation Amount: None Present (0%) Exposed Structure Necrotic Amount: Large (67-100%) Fascia Exposed: No Necrotic Quality: Eschar, Adherent  Slough Fat Layer (Subcutaneous Tissue) Exposed: Yes Tendon Exposed: No Muscle Exposed: No Joint Exposed: No Bone Exposed: No Treatment Notes Wound #1 (Upper Leg) Wound Laterality: Right, Medial Cleanser Peri-Wound Care Topical Primary Dressing JISELA, MERLINO ([id number]) Xeroform 5x9-HBD (in/in) Quantity: 2 Discharge Instruction: Apply Xeroform 5x9-HBD (in/in) as directed Secondary Dressing Gauze Quantity: 1 Discharge Instruction: As directed: dry, moistened with saline or moistened with Dakins Solution Secured With [employer] Sterile or Non-Sterile 6-ply 4.5x4 (yd/yd) Quantity: 1 Discharge Instruction: Apply Kerlix as directed Compression Wrap Compression Stockings Add-Ons Electronic Signature(s) Signed: 12/02/2020 4:32:07 PM By: Georges Mouse, Minus Breeding RN Entered By: Georges Mouse, Minus Breeding on 12/02/2020 09:07:17 [name] ([id number]) -------------------------------------------------------------------------------- Wound Assessment Details Patient Name: [name] Date of Service: 12/02/2020 8:30 AM Medical Record Number: [id number] Patient Account Number: [id number] Date of Birth/Sex: 1971-04-05 (50 y.o. F) Treating RN: Dolan Amen Primary Care Shardea Cwynar: Catalina Antigua Other Clinician: Referring Letizia Hook: Ruta Hinds Treating Brendin Situ/Extender: Ricard Dillon Weeks in Treatment: 0 Wound Status Wound Number: 2 Primary  Calciphylaxis Etiology: Wound Location: Left, Medial Upper Leg Wound Open Wounding Event: Gradually Appeared Status: Date Acquired: 07/22/2020 Comorbid Cataracts, Glaucoma, Anemia, Hypertension, Type II Weeks Of Treatment: 0 History: Diabetes, End Stage Renal Disease, Neuropathy Clustered Wound: No Photos Photo Uploaded By: Georges Mouse, Minus Breeding on 12/02/2020 09:27:12 Wound Measurements Length: (cm) 13 Width: (cm) 12 Depth: (cm) 0.1 Area: (cm) 122.522 Volume: (cm) 12.252 % Reduction in Area: 0% % Reduction in Volume: 0% Epithelialization: None Tunneling: No Undermining: No Wound Description Classification: Full Thickness Without Exposed Support Structure s Foul Odor After Cleansing: No Slough/Fibrino Yes Wound Bed Granulation Amount: None Present (0%) Exposed Structure Necrotic Amount: Large (67-100%) Fascia Exposed: No Necrotic Quality: Eschar, Adherent Slough Fat Layer (Subcutaneous Tissue) Exposed: Yes Tendon Exposed: No Muscle Exposed: No Joint Exposed: No Bone Exposed: No Treatment Notes Wound #2 (Upper Leg) Wound Laterality: Left, Medial Cleanser Peri-Wound Care Topical Primary Dressing Wiebelhaus, Vito Berger ([id number]) Xeroform 5x9-HBD (in/in) Quantity: 1 Discharge Instruction: Apply Xeroform 5x9-HBD (in/in) as directed Secondary Dressing Gauze Quantity: 1 Discharge Instruction: As directed: dry, moistened with saline or moistened with Dakins Solution Secured With [employer] Sterile or Non-Sterile 6-ply 4.5x4 (yd/yd) Quantity: 1 Discharge Instruction: Apply Kerlix as directed Compression Wrap Compression Stockings Add-Ons Electronic Signature(s) Signed: 12/02/2020 4:32:07 PM By: Georges Mouse, Minus Breeding RN Entered By: Georges Mouse, Minus Breeding on 12/02/2020 09:07:50 [name] ([id number]) -------------------------------------------------------------------------------- Wound Assessment Details Patient Name: [name] Date of Service:  12/02/2020 8:30 AM Medical Record Number: [id number] Patient Account Number: [id number] Date of Birth/Sex: April 27, 1971 (50 y.o. F) Treating RN: Dolan Amen Primary Care Camrie Stock: Catalina Antigua Other Clinician: Referring Bonni Neuser: Ruta Hinds Treating Chisum Habenicht/Extender: Tito Dine in Treatment: 0 Wound Status Wound Number: 3 Primary Calciphylaxis Etiology: Wound Location: Left, Lateral Upper Leg Wound Open Wounding Event: Gradually Appeared Status: Date Acquired: 07/22/2020 Comorbid Cataracts, Glaucoma, Anemia, Hypertension, Type II Weeks Of Treatment: 0 History: Diabetes, End Stage Renal Disease, Neuropathy Clustered Wound: No Photos Photo Uploaded By: Georges Mouse, Minus Breeding on 12/02/2020 09:27:43 Wound Measurements Length: (cm) 4 Width: (cm) 8 Depth: (cm) 0.1 Area: (cm) 25.133 Volume: (cm) 2.513 % Reduction in Area: 0% % Reduction in Volume: 0% Epithelialization: None Tunneling: No Undermining: No Wound Description Classification: Full Thickness Without Exposed Support Structure s Foul Odor After Cleansing: No Slough/Fibrino Yes Wound Bed Granulation Amount: None Present (0%) Exposed Structure Necrotic Amount: Large (67-100%) Fascia Exposed: No Necrotic Quality: Eschar, Adherent Slough Fat Layer (Subcutaneous Tissue) Exposed: Yes Tendon Exposed: No Muscle Exposed: No Joint  Exposed: No Bone Exposed: No Treatment Notes Wound #3 (Upper Leg) Wound Laterality: Left, Lateral Cleanser Peri-Wound Care Topical Primary Dressing ABREANNA, DRAWDY ([id number]) Xeroform 5x9-HBD (in/in) Quantity: 1 Discharge Instruction: Apply Xeroform 5x9-HBD (in/in) as directed Secondary Dressing Gauze Quantity: 1 Discharge Instruction: As directed: dry, moistened with saline or moistened with Dakins Solution Secured With [employer] or Non-Sterile 6-ply 4.5x4 (yd/yd) Quantity: 1 Discharge Instruction: Apply Kerlix as directed Compression  Wrap Compression Stockings Add-Ons Electronic Signature(s) Signed: 12/02/2020 4:32:07 PM By: Georges Mouse, Minus Breeding RN Entered By: Georges Mouse, Minus Breeding on 12/02/2020 09:08:25 [name] ([id number]) -------------------------------------------------------------------------------- Wound Assessment Details Patient Name: [name] Date of Service: 12/02/2020 8:30 AM Medical Record Number: [id number] Patient Account Number: [id number] Date of Birth/Sex: [birth year] (50 y.o. F) Treating RN: Dolan Amen Primary Care Tishana Clinkenbeard: Catalina Antigua Other Clinician: Referring Severiano Utsey: Ruta Hinds Treating Awad Gladd/Extender: Tito Dine in Treatment: 0 Wound Status Wound Number: 4 Primary Pressure Ulcer Etiology: Wound Location: Right Calcaneus Wound Open Wounding Event: Gradually Appeared Status: Date Acquired: 10/21/2020 Comorbid Cataracts, Glaucoma, Anemia, Hypertension, Type II Weeks Of Treatment: 0 History: Diabetes, End Stage Renal Disease, Neuropathy Clustered Wound: No Photos Photo Uploaded By: Georges Mouse, Minus Breeding on 12/02/2020 09:27:45 Wound Measurements Length: (cm) 6 Width: (cm) 6 Depth: (cm) 0.1 Area: (cm) 28.274 Volume: (cm) 2.827 % Reduction in Area: 0% % Reduction in Volume: 0% Epithelialization: None Tunneling: No Undermining: No Wound Description Classification: Unstageable/Unclassified Exudate Amount: None Present Foul Odor After Cleansing: No Slough/Fibrino No Wound Bed Granulation Amount: None Present (0%) Exposed Structure Necrotic Amount: Large (67-100%) Fascia Exposed: No Necrotic Quality: Eschar Fat Layer (Subcutaneous Tissue) Exposed: Yes Tendon Exposed: No Muscle Exposed: No Joint Exposed: No Bone Exposed: No Treatment Notes Wound #4 (Calcaneus) Wound Laterality: Right Cleanser Peri-Wound Care Betadine-swabs Quantity: 1 Discharge Instruction: Apply Betadine as directed MAKAIA, RAPPA  ([id number]) Topical Primary Dressing Secondary Dressing Secured With Kerlix Roll Sterile or Non-Sterile 6-ply 4.5x4 (yd/yd) Quantity: 1 Discharge Instruction: Apply Kerlix as directed Compression Wrap Compression Stockings Add-Ons ALLEVYN Heel 4 1/2in x 5 1/2in / 10.5cm x 13.5cm Quantity: 1 Discharge Instruction: Apply the white, patterned surface to the heel. Electronic Signature(s) Signed: 12/02/2020 4:32:07 PM By: Georges Mouse, Minus Breeding RN Entered By: Georges Mouse, Minus Breeding on 12/02/2020 09:09:40 [name] ([id number]) -------------------------------------------------------------------------------- Vitals Details Patient Name: [name] Date of Service: 12/02/2020 8:30 AM Medical Record Number: [id number] Patient Account Number: [id number] Date of Birth/Sex: 1970/11/25 (49 y.o. F) Treating RN: Dolan Amen Primary Care Makyi Ledo: Catalina Antigua Other Clinician: Referring Aubreigh Fuerte: Ruta Hinds Treating Shequita Peplinski/Extender: Tito Dine in Treatment: 0 Vital Signs Time Taken: 08:45 Temperature (F): 98.9 Height (in): 66 Pulse (bpm): 77 Source: Stated Respiratory Rate (breaths/min): 16 Weight (lbs): 235 Blood Pressure (mmHg): 174/109 Source: Stated Reference Range: 80 - 120 mg / dl Body Mass Index (BMI): 37.9 Electronic Signature(s) Signed: 12/02/2020 4:32:07 PM By: Georges Mouse, Minus Breeding RN Entered By: Georges Mouse, Minus Breeding on 12/02/2020 08:46:47

## 2020-12-08 ENCOUNTER — Other Ambulatory Visit (HOSPITAL_COMMUNITY): Payer: Medicaid - Out of State

## 2020-12-09 ENCOUNTER — Telehealth: Payer: Self-pay

## 2020-12-09 ENCOUNTER — Other Ambulatory Visit (HOSPITAL_COMMUNITY)
Admission: RE | Admit: 2020-12-09 | Discharge: 2020-12-09 | Disposition: A | Discharge status: Home / Self Care | Payer: Medicaid - Out of State | Source: Ambulatory Visit | Attending: Vascular Surgery | Admitting: Vascular Surgery

## 2020-12-09 DIAGNOSIS — Z20822 Contact with and (suspected) exposure to covid-19: Secondary | ICD-10-CM | POA: Insufficient documentation

## 2020-12-09 DIAGNOSIS — Z01818 Encounter for other preprocedural examination: Secondary | ICD-10-CM | POA: Insufficient documentation

## 2020-12-09 LAB — SARS CORONAVIRUS 2 (TAT 6-24 HRS): SARS Coronavirus 2: NEGATIVE

## 2020-12-09 NOTE — Telephone Encounter (Signed)
Pt called stating she missed her pre-procedure Covid test on yesterday. Pt r/s for test on today at [facility] location. Pt verbalized understanding.

## 2020-12-11 ENCOUNTER — Ambulatory Visit (HOSPITAL_COMMUNITY)
Admission: RE | Admit: 2020-12-11 | Payer: Medicaid - Out of State | Source: Home / Self Care | Admitting: Vascular Surgery

## 2020-12-11 ENCOUNTER — Telehealth: Payer: Self-pay

## 2020-12-11 ENCOUNTER — Encounter (HOSPITAL_COMMUNITY): Admission: RE | Payer: Self-pay | Source: Home / Self Care

## 2020-12-11 SURGERY — ABDOMINAL AORTOGRAM W/LOWER EXTREMITY
Anesthesia: LOCAL

## 2020-12-11 NOTE — Telephone Encounter (Signed)
Spoke with pt. States she had to cxl her angiogram procedure today because her daughter who was bringing her slipped on the ice and was unable to driver her to appointment from [place]. Because pt has cancelled twice per Dr. Oneida Alar, if she wants to continue with procedure she will need to be seen in office to re-eval and discuss. Informed pt of this information. Pt scheduled for 12/17/20 arrival at 1005. Pt verbalized understanding and agreed to come on dialysis day.

## 2020-12-16 ENCOUNTER — Ambulatory Visit: Payer: Medicaid - Out of State | Admitting: Internal Medicine

## 2020-12-17 ENCOUNTER — Other Ambulatory Visit: Payer: Self-pay

## 2020-12-17 ENCOUNTER — Ambulatory Visit (INDEPENDENT_AMBULATORY_CARE_PROVIDER_SITE_OTHER): Payer: Medicaid - Out of State | Admitting: Vascular Surgery

## 2020-12-17 ENCOUNTER — Other Ambulatory Visit (HOSPITAL_COMMUNITY)
Admission: RE | Admit: 2020-12-17 | Discharge: 2020-12-17 | Disposition: A | Discharge status: Home / Self Care | Payer: Medicaid - Out of State | Source: Ambulatory Visit | Attending: Vascular Surgery | Admitting: Vascular Surgery

## 2020-12-17 ENCOUNTER — Encounter: Payer: Self-pay | Admitting: Vascular Surgery

## 2020-12-17 VITALS — BP 148/67 | HR 70 | Temp 98.0°F

## 2020-12-17 DIAGNOSIS — I739 Peripheral vascular disease, unspecified: Secondary | ICD-10-CM | POA: Diagnosis not present

## 2020-12-17 DIAGNOSIS — Z01812 Encounter for preprocedural laboratory examination: Secondary | ICD-10-CM | POA: Diagnosis present

## 2020-12-17 DIAGNOSIS — Z20822 Contact with and (suspected) exposure to covid-19: Secondary | ICD-10-CM | POA: Diagnosis not present

## 2020-12-17 MED ORDER — SODIUM CHLORIDE 0.9% FLUSH: 3.0000 mL | Freq: Two times a day (BID) | INTRAVENOUS | Status: DC

## 2020-12-17 MED ORDER — SODIUM CHLORIDE 0.9% FLUSH: 3.0000 mL | INTRAVENOUS | Status: DC | PRN

## 2020-12-17 MED ORDER — SODIUM CHLORIDE 0.9 % IV SOLN: 250.0000 mL | INTRAVENOUS | Status: DC | PRN

## 2020-12-17 NOTE — H&P (View-Only) (Signed)
Patient is a 50 year old female with a known right heel decubitus ulcer.  She also has bilateral thigh wounds which are thought to be calciphylaxis.  She has been scheduled for arteriogram on several prior occasions.  She was most recently seen by our [facility] clinic November 19, 2020.  She has been scheduled for arteriogram on several prior occasions but has no showed on several of these for a variety of reasons.  The wound in her right foot has not really gotten any worse.  Her current dialysis day is Tuesday Thursday Saturday.  Past Medical History:  Diagnosis Date  . Anemia   . 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 Diabetes mellitus, type II ([place])   . Dyslipidemia   . Glaucoma   . Hypertension   . Hypothyroidism (acquired)   . Kidney disease    Stage 5  . Pneumonia     Past Surgical History:  Procedure Laterality Date  . ANKLE FRACTURE SURGERY    . AV FISTULA PLACEMENT Left 08/18/2020   Procedure: LEFT ARM BRACHIOCEPHALIC ARTERIOVENOUS (AV) FISTULA CREATION;  Surgeon: Elam Dutch, MD;  Location: [place];  Service: Vascular;  Laterality: Left;  . CESAREAN SECTION    . CHOLECYSTECTOMY    . EYE SURGERY     Vatrectomy  . IR PERC TUN PERIT CATH WO PORT S&I Dartha Lodge  09/15/2020  . IR US GUIDE VASC ACCESS RIGHT  09/15/2020  . TOE SURGERY      Current Outpatient Medications on File Prior to Visit  Medication Sig Dispense Refill  . acetaminophen (TYLENOL) 500 MG tablet Take 1,000 mg by mouth every 6 (six) hours as needed for moderate pain.    Marland Kitchen atorvastatin (LIPITOR) 10 MG tablet Take 10 mg by mouth daily.    Marland Kitchen epoetin alfa (EPOGEN) 4000 UNIT/ML injection Inject 4,000 Units into the skin every 14 (fourteen) days.     Marland Kitchen HUMALOG KWIKPEN 100 UNIT/ML KwikPen Inject 5-15 Units into the skin 3 (three) times daily with meals.    . insulin glargine (LANTUS) 100 UNIT/ML injection Inject 50 Units into the skin at bedtime.    Marland Kitchen levothyroxine (SYNTHROID) 50  MCG tablet Take 50 mcg by mouth daily before breakfast.    . metoprolol succinate (TOPROL-XL) 50 MG 24 hr tablet Take 50 mg by mouth at bedtime. Take with or immediately following a meal.    . sevelamer carbonate (RENVELA) 800 MG tablet Take 800 mg by mouth 3 (three) times daily with meals.    . torsemide (DEMADEX) 100 MG tablet Take 150 mg by mouth daily.     . ANTACID EXTRA STRENGTH 750 MG chewable tablet Chew 750 mg by mouth daily. (Patient not taking: Reported on 12/17/2020)    . HYDROcodone-acetaminophen (NORCO) 5-325 MG tablet Take 1 tablet by mouth every 6 (six) hours as needed for moderate pain. (Patient not taking: No sig reported) 30 tablet 0  . sodium bicarbonate 650 MG tablet Take 1,300 mg by mouth 3 (three) times daily. (Patient not taking: Reported on 12/17/2020)     No current facility-administered medications on file prior to visit.   Allergies  Allergen Reactions  . Ace Inhibitors Cough    Review of systems: She has no shortness of breath.  She has no chest pain.  Physical exam:  Vitals:   12/17/20 1011  BP: (!) 148/67  Pulse: 70  Temp: 98 F (36.7 C)  TempSrc: Skin  SpO2: 99%  Skin: Wounds are very similar to those in her chart from November 19, 2020.  She has a gangrenous right heel wound and bilateral thigh wounds.  Extremities: She has no palpable pedal pulses  Data: Previous duplex scan from December 2021 shows biphasic flow in the right lower extremity as well as the left lower extremity.  However study was suboptimal and tibial vessels were not well visualized.  Toe pressure was greater than 100 bilaterally.  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  Right heel decubitus wound potential peripheral arterial disease contributing to this.  Plan: Patient will be scheduled for aortogram lower extremity runoff tomorrow with primarily focusing on the right leg to see if we can improve perfusion to heal  the right heel wound.  Risk benefits possible complications and procedure details were discussed with the patient today.  She will go for a Covid test today and hopefully be able to make the appointment tomorrow for her arteriogram.  Ruta Hinds, MD [facility] Office: [phone]

## 2020-12-17 NOTE — Progress Notes (Signed)
Patient is a 50 year old female with a known right heel decubitus ulcer.  She also has bilateral thigh wounds which are thought to be calciphylaxis.  She has been scheduled for arteriogram on several prior occasions.  She was most recently seen by our [facility] clinic November 19, 2020.  She has been scheduled for arteriogram on several prior occasions but has no showed on several of these for a variety of reasons.  The wound in her right foot has not really gotten any worse.  Her current dialysis day is Tuesday Thursday Saturday.  Past Medical History:  Diagnosis Date  . Anemia   . 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 Diabetes mellitus, type II ([place])   . Dyslipidemia   . Glaucoma   . Hypertension   . Hypothyroidism (acquired)   . Kidney disease    Stage 5  . Pneumonia     Past Surgical History:  Procedure Laterality Date  . ANKLE FRACTURE SURGERY    . AV FISTULA PLACEMENT Left 08/18/2020   Procedure: LEFT ARM BRACHIOCEPHALIC ARTERIOVENOUS (AV) FISTULA CREATION;  Surgeon: Elam Dutch, MD;  Location: [place];  Service: Vascular;  Laterality: Left;  . CESAREAN SECTION    . CHOLECYSTECTOMY    . EYE SURGERY     Vatrectomy  . IR PERC TUN PERIT CATH WO PORT S&I Dartha Lodge  09/15/2020  . IR US GUIDE VASC ACCESS RIGHT  09/15/2020  . TOE SURGERY      Current Outpatient Medications on File Prior to Visit  Medication Sig Dispense Refill  . acetaminophen (TYLENOL) 500 MG tablet Take 1,000 mg by mouth every 6 (six) hours as needed for moderate pain.    Marland Kitchen atorvastatin (LIPITOR) 10 MG tablet Take 10 mg by mouth daily.    Marland Kitchen epoetin alfa (EPOGEN) 4000 UNIT/ML injection Inject 4,000 Units into the skin every 14 (fourteen) days.     Marland Kitchen HUMALOG KWIKPEN 100 UNIT/ML KwikPen Inject 5-15 Units into the skin 3 (three) times daily with meals.    . insulin glargine (LANTUS) 100 UNIT/ML injection Inject 50 Units into the skin at bedtime.    Marland Kitchen levothyroxine (SYNTHROID) 50  MCG tablet Take 50 mcg by mouth daily before breakfast.    . metoprolol succinate (TOPROL-XL) 50 MG 24 hr tablet Take 50 mg by mouth at bedtime. Take with or immediately following a meal.    . sevelamer carbonate (RENVELA) 800 MG tablet Take 800 mg by mouth 3 (three) times daily with meals.    . torsemide (DEMADEX) 100 MG tablet Take 150 mg by mouth daily.     . ANTACID EXTRA STRENGTH 750 MG chewable tablet Chew 750 mg by mouth daily. (Patient not taking: Reported on 12/17/2020)    . HYDROcodone-acetaminophen (NORCO) 5-325 MG tablet Take 1 tablet by mouth every 6 (six) hours as needed for moderate pain. (Patient not taking: No sig reported) 30 tablet 0  . sodium bicarbonate 650 MG tablet Take 1,300 mg by mouth 3 (three) times daily. (Patient not taking: Reported on 12/17/2020)     No current facility-administered medications on file prior to visit.   Allergies  Allergen Reactions  . Ace Inhibitors Cough    Review of systems: She has no shortness of breath.  She has no chest pain.  Physical exam:  Vitals:   12/17/20 1011  BP: (!) 148/67  Pulse: 70  Temp: 98 F (36.7 C)  TempSrc: Skin  SpO2: 99%  Skin: Wounds are very similar to those in her chart from November 19, 2020.  She has a gangrenous right heel wound and bilateral thigh wounds.  Extremities: She has no palpable pedal pulses  Data: Previous duplex scan from December 2021 shows biphasic flow in the right lower extremity as well as the left lower extremity.  However study was suboptimal and tibial vessels were not well visualized.  Toe pressure was greater than 100 bilaterally.  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  Right heel decubitus wound potential peripheral arterial disease contributing to this.  Plan: Patient will be scheduled for aortogram lower extremity runoff tomorrow with primarily focusing on the right leg to see if we can improve perfusion to heal  the right heel wound.  Risk benefits possible complications and procedure details were discussed with the patient today.  She will go for a Covid test today and hopefully be able to make the appointment tomorrow for her arteriogram.  Ruta Hinds, MD [facility] Office: [phone]

## 2020-12-18 ENCOUNTER — Encounter (HOSPITAL_COMMUNITY)
Admission: RE | Disposition: A | Discharge status: Home / Self Care | Payer: Self-pay | Source: Home / Self Care | Attending: Vascular Surgery

## 2020-12-18 ENCOUNTER — Ambulatory Visit (HOSPITAL_COMMUNITY)
Admission: RE | Admit: 2020-12-18 | Discharge: 2020-12-18 | Disposition: A | Discharge status: Home / Self Care | Payer: Medicaid - Out of State | Attending: Vascular Surgery | Admitting: Vascular Surgery

## 2020-12-18 DIAGNOSIS — Z79899 Other long term (current) drug therapy: Secondary | ICD-10-CM | POA: Diagnosis not present

## 2020-12-18 DIAGNOSIS — E1152 Type 2 diabetes mellitus with diabetic peripheral angiopathy with gangrene: Secondary | ICD-10-CM | POA: Insufficient documentation

## 2020-12-18 DIAGNOSIS — Z794 Long term (current) use of insulin: Secondary | ICD-10-CM | POA: Insufficient documentation

## 2020-12-18 DIAGNOSIS — E11621 Type 2 diabetes mellitus with foot ulcer: Secondary | ICD-10-CM | POA: Diagnosis not present

## 2020-12-18 DIAGNOSIS — I70261 Atherosclerosis of native arteries of extremities with gangrene, right leg: Secondary | ICD-10-CM | POA: Diagnosis not present

## 2020-12-18 DIAGNOSIS — I739 Peripheral vascular disease, unspecified: Secondary | ICD-10-CM | POA: Diagnosis not present

## 2020-12-18 DIAGNOSIS — L97419 Non-pressure chronic ulcer of right heel and midfoot with unspecified severity: Secondary | ICD-10-CM | POA: Diagnosis not present

## 2020-12-18 DIAGNOSIS — E13621 Other specified diabetes mellitus with foot ulcer: Secondary | ICD-10-CM

## 2020-12-18 HISTORY — PX: ABDOMINAL AORTOGRAM W/LOWER EXTREMITY: CATH118223

## 2020-12-18 LAB — POCT I-STAT, CHEM 8
BUN: 34 mg/dL — ABNORMAL HIGH (ref 6–20)
Calcium, Ion: 1.21 mmol/L (ref 1.15–1.40)
Chloride: 97 mmol/L — ABNORMAL LOW (ref 98–111)
Creatinine, Ser: 4.4 mg/dL — ABNORMAL HIGH (ref 0.44–1.00)
Glucose, Bld: 123 mg/dL — ABNORMAL HIGH (ref 70–99)
HCT: 31 % — ABNORMAL LOW (ref 36.0–46.0)
Hemoglobin: 10.5 g/dL — ABNORMAL LOW (ref 12.0–15.0)
Potassium: 3.7 mmol/L (ref 3.5–5.1)
Sodium: 135 mmol/L (ref 135–145)
TCO2: 25 mmol/L (ref 22–32)

## 2020-12-18 LAB — GLUCOSE, CAPILLARY: Glucose-Capillary: 116 mg/dL — ABNORMAL HIGH (ref 70–99)

## 2020-12-18 LAB — SARS CORONAVIRUS 2 (TAT 6-24 HRS): SARS Coronavirus 2: NEGATIVE

## 2020-12-18 SURGERY — ABDOMINAL AORTOGRAM W/LOWER EXTREMITY
Anesthesia: LOCAL | Laterality: Bilateral

## 2020-12-18 MED ORDER — MIDAZOLAM HCL 2 MG/2ML IJ SOLN
INTRAMUSCULAR | Status: DC | PRN
  Administered 2020-12-18: 1 mg via INTRAVENOUS

## 2020-12-18 MED ORDER — LABETALOL HCL 5 MG/ML IV SOLN: 10.0000 mg | INTRAVENOUS | Status: DC | PRN

## 2020-12-18 MED ORDER — HYDRALAZINE HCL 20 MG/ML IJ SOLN: 5.0000 mg | INTRAMUSCULAR | Status: DC | PRN

## 2020-12-18 MED ORDER — FENTANYL CITRATE (PF) 100 MCG/2ML IJ SOLN
INTRAMUSCULAR | Status: DC | PRN
  Administered 2020-12-18: 25 ug via INTRAVENOUS

## 2020-12-18 MED ORDER — OXYCODONE HCL 5 MG PO TABS: 5.0000 mg | ORAL_TABLET | ORAL | Status: DC | PRN

## 2020-12-18 MED ORDER — SODIUM CHLORIDE 0.9% FLUSH: 3.0000 mL | Freq: Two times a day (BID) | INTRAVENOUS | Status: DC

## 2020-12-18 MED ORDER — ACETAMINOPHEN 325 MG PO TABS: 650.0000 mg | ORAL_TABLET | ORAL | Status: DC | PRN

## 2020-12-18 MED ORDER — LIDOCAINE HCL (PF) 1 % IJ SOLN
INTRAMUSCULAR | Status: AC
  Filled 2020-12-18: qty 30

## 2020-12-18 MED ORDER — HEPARIN (PORCINE) IN NACL 1000-0.9 UT/500ML-% IV SOLN
INTRAVENOUS | Status: AC
  Filled 2020-12-18: qty 1000

## 2020-12-18 MED ORDER — HEPARIN (PORCINE) IN NACL 1000-0.9 UT/500ML-% IV SOLN
INTRAVENOUS | Status: DC | PRN
  Administered 2020-12-18 (×2): 500 mL

## 2020-12-18 MED ORDER — SODIUM CHLORIDE 0.9% FLUSH: 3.0000 mL | INTRAVENOUS | Status: DC | PRN

## 2020-12-18 MED ORDER — MORPHINE SULFATE (PF) 2 MG/ML IV SOLN: 2.0000 mg | INTRAVENOUS | Status: DC | PRN

## 2020-12-18 MED ORDER — MIDAZOLAM HCL 2 MG/2ML IJ SOLN
INTRAMUSCULAR | Status: AC
  Filled 2020-12-18: qty 2

## 2020-12-18 MED ORDER — LIDOCAINE HCL (PF) 1 % IJ SOLN
INTRAMUSCULAR | Status: DC | PRN
  Administered 2020-12-18: 18 mL

## 2020-12-18 MED ORDER — FENTANYL CITRATE (PF) 100 MCG/2ML IJ SOLN
INTRAMUSCULAR | Status: AC
  Filled 2020-12-18: qty 2

## 2020-12-18 MED ORDER — SODIUM CHLORIDE 0.9 % IV SOLN: 250.0000 mL | INTRAVENOUS | Status: DC | PRN

## 2020-12-18 MED ORDER — ONDANSETRON HCL 4 MG/2ML IJ SOLN: 4.0000 mg | Freq: Four times a day (QID) | INTRAMUSCULAR | Status: DC | PRN

## 2020-12-18 MED ORDER — IODIXANOL 320 MG/ML IV SOLN
INTRAVENOUS | Status: DC | PRN
  Administered 2020-12-18: 125 mL

## 2020-12-18 SURGICAL SUPPLY — 10 items
CATH ANGIO 5F PIGTAIL 65CM (CATHETERS) ×2 IMPLANT
CATH CROSS OVER TEMPO 5F (CATHETERS) ×2 IMPLANT
CATH STRAIGHT 5FR 65CM (CATHETERS) ×2 IMPLANT
KIT PV (KITS) ×2 IMPLANT
SHEATH PINNACLE 5F 10CM (SHEATH) ×2 IMPLANT
SHEATH PROBE COVER 6X72 (BAG) ×2 IMPLANT
SYR MEDRAD MARK V 150ML (SYRINGE) ×2 IMPLANT
TRANSDUCER W/STOPCOCK (MISCELLANEOUS) ×2 IMPLANT
TRAY PV CATH (CUSTOM PROCEDURE TRAY) ×2 IMPLANT
WIRE HITORQ VERSACORE ST 145CM (WIRE) ×2 IMPLANT

## 2020-12-18 NOTE — Discharge Instructions (Signed)
Femoral Site Care  This sheet gives you information about how to care for yourself after your procedure. Your health care provider may also give you more specific instructions. If you have problems or questions, contact your health care provider. What can I expect after the procedure? After the procedure, it is common to have:  Bruising that usually fades within 1-2 weeks.  Tenderness at the site. Follow these instructions at home: Wound care  Follow instructions from your health care provider about how to take care of your insertion site. Make sure you: ? Wash your hands with soap and water before you change your bandage (dressing). If soap and water are not available, use hand sanitizer. ? Change your dressing as told by your health care provider. ? Leave stitches (sutures), skin glue, or adhesive strips in place. These skin closures may need to stay in place for 2 weeks or longer. If adhesive strip edges start to loosen and curl up, you may trim the loose edges. Do not remove adhesive strips completely unless your health care provider tells you to do that.  Do not take baths, swim, or use a hot tub until your health care provider approves.  You may shower 24-48 hours after the procedure or as told by your health care provider. ? Gently wash the site with plain soap and water. ? Pat the area dry with a clean towel. ? Do not rub the site. This may cause bleeding.  Do not apply powder or lotion to the site. Keep the site clean and dry.  Check your femoral site every day for signs of infection. Check for: ? Redness, swelling, or pain. ? Fluid or blood. ? Warmth. ? Pus or a bad smell. Activity  For the first 2-3 days after your procedure, or as long as directed: ? Avoid climbing stairs as much as possible. ? Do not squat.  Do not lift anything that is heavier than 10 lb (4.5 kg), or the limit that you are told, until your health care provider says that it is safe.  Rest as  directed. ? Avoid sitting for a long time without moving. Get up to take short walks every 1-2 hours.  Do not drive for 24 hours if you were given a medicine to help you relax (sedative). General instructions  Take over-the-counter and prescription medicines only as told by your health care provider.  Keep all follow-up visits as told by your health care provider. This is important. Contact a health care provider if you have:  A fever or chills.  You have redness, swelling, or pain around your insertion site. Get help right away if:  The catheter insertion area swells very fast.  You pass out.  You suddenly start to sweat or your skin gets clammy.  The catheter insertion area is bleeding, and the bleeding does not stop when you hold steady pressure on the area.  The area near or just beyond the catheter insertion site becomes pale, cool, tingly, or numb. These symptoms may represent a serious problem that is an emergency. Do not wait to see if the symptoms will go away. Get medical help right away. Call your local emergency services (911 in the [place] Do not drive yourself to the hospital. Summary  After the procedure, it is common to have bruising that usually fades within 1-2 weeks.  Check your femoral site every day for signs of infection.  Do not lift anything that is heavier than 10 lb (4.5 kg), or   the limit that you are told, until your health care provider says that it is safe. This information is not intended to replace advice given to you by your health care provider. Make sure you discuss any questions you have with your health care provider. Document Revised: 07/10/2020 Document Reviewed: 07/10/2020 Elsevier Patient Education  2021 [employer]vier Inc.  

## 2020-12-18 NOTE — Progress Notes (Signed)
Discharge instructions discussed and reviewed with pt and Jeneen Rinks via phone conference, both verbalized understanding

## 2020-12-18 NOTE — Progress Notes (Signed)
Site area: Left groin a 5 french arterial sheath was removed  Site Prior to Removal:  Level 0  Pressure Applied For 20 MINUTES    Bedrest Beginning at  1130am X 4 hours  Manual:   Yes.    Patient Status During Pull:  stable  Post Pull Groin Site:  Level 0  Post Pull Instructions Given:  Yes.    Post Pull Pulses Present:  Yes.    Dressing Applied:  Yes.    Comments:

## 2020-12-18 NOTE — Interval H&P Note (Signed)
History and Physical Interval Note:  12/18/2020 9:11 AM  Amy Moses  has presented today for surgery, with the diagnosis of peripheral vascular disease.  The various methods of treatment have been discussed with the patient and family. After consideration of risks, benefits and other options for treatment, the patient has consented to  Procedure(s): ABDOMINAL AORTOGRAM W/LOWER EXTREMITY (N/A) as a surgical intervention.  The patient's history has been reviewed, patient examined, no change in status, stable for surgery.  I have reviewed the patient's chart and labs.  Questions were answered to the patient's satisfaction.     Ruta Hinds

## 2020-12-18 NOTE — Op Note (Signed)
Procedure: Abdominal aortogram with bilateral lower extremity runoff  Preoperative diagnosis: Nonhealing wounds right foot  Postoperative diagnosis: Same  Anesthesia: Local with IV sedation  Sedation time: 28 minutes  Operative findings: 1.  Occlusion right anterior tibial artery with patent peroneal and posterior tibial artery to the right foot  2.  No significant aortoiliac occlusive disease  Operative details: After obtaining form consent, the patient taken the PV lab.  The patient was placed in supine position angio table.  Both groins were prepped and draped in usual sterile fashion.  Local anesthesia was infiltrated over the left common femoral artery.  Ultrasound was used to identify the left common femoral artery and femoral bifurcation.  An introducer needle was used to cannulate the left common femoral artery and an 035 versa core wire threaded up the abdominal aorta under fluoroscopic guidance.  Next a 5 French sheath placed over the guidewire in the left common femoral artery.  This was thoroughly flushed with heparinized saline.  5 French pigtail catheter was advanced over the guidewire to the abdominal aorta.  Abdominal aortogram was obtained in AP projection.  Left and right renal arteries are patent.  Infrarenal abdominal aorta common external and internal iliac arteries are all widely patent.  Next the pigtail catheter was pulled down just above the aortic bifurcation and bilateral lower extremity runoff views were obtained through the pigtail catheter.  In the left lower extremity the left common femoral profunda femoris and superficial femoral popliteal artery and the origin of all 3 tibial vessels is patent.  There was brisk contrast flow in the distal portions of the tibial vessels were not well visualized on the left leg but appeared to be patent.  In the right lower extremity the right common femoral profunda femoris and superficial femoral arteries as well as the popliteal  artery are all patent.  The origin of all 3 tibial vessels is also patent.  However again due to timing of contrast we did not opacify the foot very well on the initial runs.  At this point pigtail catheter was removed over guidewire and swapped out for a 5 [place] crossover catheter which was used to selectively catheterize the right common followed by the right external right common femoral and down into the mid right superficial femoral artery.  A 5 French straight catheter was advanced over the guidewire into this segment.  Right lower extremity runoff views were then obtained from the knee down.  The right popliteal artery posterior tibial artery and peroneal arteries are all widely patent.  The posterior tibial artery is small but does fill all the way into the area the ulcer in the right heel.  The anterior tibial artery is patent proximally but occludes in the distal 3rd of the leg.  At this point a 5 [place] straight catheter was removed.  5 French sheath was left in place to be pulled in the holding area.  Patient tired procedure well and there were no complications.  Patient was taken to the holding area in stable condition.  Operative management: Patient has adequate flow for healing of her wounds.  She does have occlusion of the distal anterior tibial artery but has patent flow through the posterior tibial and peroneal arteries to the right foot.  She also has reasonable tibial flow to the left foot.  No vascular intervention at this point.  The patient will follow up at the wound center with Dr. Dellia Nims.  Ruta Hinds, MD [facility] Office: [phone]

## 2020-12-18 NOTE — Progress Notes (Signed)
Pt up to wheelchair without difficulty or bleeding.   Discharged home with boyfriend who will drive and stay with pt x 24 hrs

## 2020-12-21 ENCOUNTER — Encounter (HOSPITAL_COMMUNITY): Payer: Self-pay | Admitting: Vascular Surgery

## 2020-12-22 ENCOUNTER — Encounter (HOSPITAL_BASED_OUTPATIENT_CLINIC_OR_DEPARTMENT_OTHER): Payer: Medicaid - Out of State | Admitting: Internal Medicine

## 2020-12-25 ENCOUNTER — Encounter: Payer: Medicaid - Out of State | Attending: Physician Assistant | Admitting: Physician Assistant

## 2020-12-25 ENCOUNTER — Other Ambulatory Visit: Payer: Self-pay

## 2020-12-25 DIAGNOSIS — E1122 Type 2 diabetes mellitus with diabetic chronic kidney disease: Secondary | ICD-10-CM | POA: Diagnosis not present

## 2020-12-25 DIAGNOSIS — Z7984 Long term (current) use of oral hypoglycemic drugs: Secondary | ICD-10-CM | POA: Diagnosis not present

## 2020-12-25 DIAGNOSIS — I120 Hypertensive chronic kidney disease with stage 5 chronic kidney disease or end stage renal disease: Secondary | ICD-10-CM | POA: Insufficient documentation

## 2020-12-25 DIAGNOSIS — Z794 Long term (current) use of insulin: Secondary | ICD-10-CM | POA: Insufficient documentation

## 2020-12-25 DIAGNOSIS — Z992 Dependence on renal dialysis: Secondary | ICD-10-CM | POA: Insufficient documentation

## 2020-12-25 DIAGNOSIS — N185 Chronic kidney disease, stage 5: Secondary | ICD-10-CM | POA: Diagnosis not present

## 2020-12-25 DIAGNOSIS — L89610 Pressure ulcer of right heel, unstageable: Secondary | ICD-10-CM | POA: Diagnosis not present

## 2020-12-25 DIAGNOSIS — E1142 Type 2 diabetes mellitus with diabetic polyneuropathy: Secondary | ICD-10-CM | POA: Insufficient documentation

## 2020-12-25 DIAGNOSIS — E11622 Type 2 diabetes mellitus with other skin ulcer: Secondary | ICD-10-CM | POA: Insufficient documentation

## 2020-12-25 DIAGNOSIS — L97128 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: Secondary | ICD-10-CM | POA: Insufficient documentation

## 2020-12-25 DIAGNOSIS — L97118 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: Secondary | ICD-10-CM | POA: Insufficient documentation

## 2020-12-25 NOTE — Progress Notes (Addendum)
JACQLYN, FOLLIN ([id number]) Visit Report for 12/25/2020 Chief Complaint Document Details Patient Name: Amy Moses, Amy Moses Date of Service: 12/25/2020 9:30 AM Medical Record Number: [id number] Patient Account Number: [id number] Date of Birth/Sex: 02/02/1971 (50 y.o. F) Treating RN: Carlene Coria Primary Care Provider: Zenon Mayo Other Clinician: Referring Provider: Zenon Mayo Treating Provider/Extender: Skipper Cliche in Treatment: 3 Information Obtained from: Patient Chief Complaint 12/02/2020; patient is here for review of extensive wounds on her bilateral thighs as well as an area on her right plantar heel Electronic Signature(s) Signed: 12/25/2020 10:03:20 AM By: Worthy Keeler PA-C Entered By: Worthy Keeler on 12/25/2020 10:03:20 Amy Moses ([id number]) -------------------------------------------------------------------------------- HPI Details Patient Name: Amy Moses Date of Service: 12/25/2020 9:30 AM Medical Record Number: [id number] Patient Account Number: [id number] Date of Birth/Sex: 1970-12-20 (49 y.o. F) Treating RN: Carlene Coria Primary Care Provider: Zenon Mayo Other Clinician: Referring Provider: Zenon Mayo Treating Provider/Extender: Skipper Cliche in Treatment: 3 History of Present Illness HPI Description: ADMISSION 12/02/2020 This is a 50 year old woman who is a type II diabetic. Although there are mentions of type 1 diabetes in epic clearly this woman is type II based on the fact that she was on oral agents for 10 years before starting insulin. She also is in chronic renal failure and recently started on dialysis I think in November. She relates her problems starting in September she started to develop skin excoriation on her bilateral inner thighs. She thought this was a friction phenomenon however the tissue wrist can progressively broke down. She was seen by her primary doctor on 10/21/2020 noted to have erythema on both thighs medially  and a blister. MRIs were ordered and she was put on Silvadene and gauze. She was seen in the ER on 12/12 at the [place] clinic also noted to have a right unstageable heel wound. 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 She dialyzes at [facility] in [name] and she was started on sodium thiosulfate as far as the notes state. The patient states she gets something at dialysis every day although she has not exactly sure what they are giving her. She was noted by vein and vascular in [place] to have multiple open wounds on 11/19/2020 using Xeroform gauze. She had an angiogram booked by Dr. Oneida Alar predominantly I think because of the right heel ulcer although this was canceled by the patient because of the weather. The patient has large necrotic wounds on both inner thighs with covering black eschar. There is also an area on the left lateral thigh and an eschared area on her right plantar heel. She has been using Betadine to the right heel Xeroform to the areas on her legs. The patient has Medicaid but is able to get the Xeroform, were not really sure how she is managing this although she lives in [place] and there may be different rules for Medicaid in [place] versus [place]. We certainly would not be able to get that here. 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 I wondered whether her neuropathy is particularly affected her sensation of pain since her recent left patella fracture does not seem to have been that painful either Past medical history includes stage V chronic renal failure starting on dialysis I think in November, clearly type 2 diabetes with neuropathy, hypertension, glaucoma, vitamin D deficiency, history of cholecystectomy, edema of both legs, hypothyroidism, she dialyzes Tuesday  Thursday and Saturday at [facility] in [place] The  patient had arterial studies on 11/19/2020 I think at vein and vascular in [place]. She had biphasic waveforms throughout the thigh on the right and at the popliteal proximally and distally in the ATA but the PTA and peroneal were not visualized. On the left again biphasic waveforms up to the level of the distal popliteal but not visualized in the tibial vessels. She was felt to have adequate flow where visualized. As noted she was supposed to have an angiogram which I think is certainly indicated [id number] upon evaluation today patient actually appears to be doing okay in regard to her wounds. This is actually the first time of seeing her she saw Dr. Dellia Nims at the last visit she does have quite an extensive history based on review. With that being said I do not see any signs right now of active infection which is great news. Overall I am extremely pleased with where things stand in that regard. 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 I really feel like she may do better with Dakin's moistened gauze. I do think that we can see about ordering the Dakin's for her she is already using gauze and then wrapping with roll gauze anyway so really would not change much except for moistening some gauze and applying it to the wound bed. With that being said I do not see any signs of active infection at this time which is great news. The patient all in all seems to be in fairly good spirits all things considered. Electronic Signature(s) Signed: 12/25/2020 2:14:51 PM By: Worthy Keeler PA-C Entered By: Worthy Keeler on 12/25/2020 14:14:51 Amy Moses ([id number]) -------------------------------------------------------------------------------- Physical Exam Details Patient Name: Amy Moses Date of Service: 12/25/2020 9:30 AM Medical Record Number: [id number] Patient Account Number: [id number] Date of  Birth/Sex: Aug 05, 1971 (49 y.o. F) Treating RN: Carlene Coria Primary Care Provider: Zenon Mayo Other Clinician: Referring Provider: Zenon Mayo Treating Provider/Extender: Skipper Cliche in Treatment: 3 Constitutional Well-nourished and well-hydrated in no acute distress. Respiratory normal breathing without difficulty. Psychiatric this patient is able to make decisions and demonstrates good insight into disease process. Alert and Oriented x 3. pleasant and cooperative. Notes Upon inspection patient's wound bed actually showed signs of eschar pretty much at all locations. In regard to the thigh regions I do believe this would be best to try to get some of this eschar off in regard to the heel I think the big deal there is try to keep this dry and stable as it appears to be right now. The more of that we can do I think the better it will protected and to be honest we have less likely of an issue of this causing a significant problem for [name]. Nonetheless in regard to the thigh regions I do believe switching to Dakin's moistened gauze would be an optimal way to go here. Electronic Signature(s) Signed: 12/25/2020 2:15:43 PM By: Worthy Keeler PA-C Entered By: Worthy Keeler on 12/25/2020 14:15:42 Amy Moses ([id number]) -------------------------------------------------------------------------------- Physician Orders Details Patient Name: Amy Moses Date of Service: 12/25/2020 9:30 AM Medical Record Number: [id number] Patient Account Number: [id number] Date of Birth/Sex: January 03, 1971 (49 y.o. F) Treating RN: Carlene Coria Primary Care Provider: Zenon Mayo Other Clinician: Referring Provider: Zenon Mayo Treating Provider/Extender: Skipper Cliche in Treatment: 3 Verbal / Phone Orders: No Diagnosis Coding ICD-10 Coding Code  Description E83.59 Other disorders of calcium metabolism L97.118 Non-pressure chronic ulcer of right thigh with other specified  severity L97.128 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 L89.610 Pressure ulcer of right heel, unstageable E11.42 Type 2 diabetes mellitus with diabetic polyneuropathy Follow-up Appointments o Return Appointment in 3 weeks. Bathing/ Shower/ Hygiene o May shower; gently cleanse wound with antibacterial soap, rinse and pat dry prior to dressing wounds Wound Treatment Wound #1 - Upper Leg Wound Laterality: Right, Medial Cleanser: Soap and Water 1 x Per Day/30 Days Discharge Instructions: Gently cleanse wound with antibacterial soap, rinse and pat dry prior to dressing wounds Primary Dressing: Gauze 1 x Per Day/30 Days Discharge Instructions: moistened with Dakins Solution Primary Dressing: DAKINS (DME) (Generic) 1 x Per Day/30 Days Discharge Instructions: 1/4 STRENGTH DAKINS MOISTENED GAUZE TO WOUND BED Secondary Dressing: ABD Pad 5x9 (in/in) 1 x Per Day/30 Days Discharge Instructions: Cover with ABD pad Secondary Dressing: Kerlix 4.5 x 4.1 (in/yd) 1 x Per Day/30 Days Discharge Instructions: Apply Kerlix 4.5 x 4.1 (in/yd) as instructed Secured With: 58M Medipore H Soft Cloth Surgical Tape, 2x2 (in/yd) 1 x Per Day/30 Days Wound #2 - Upper Leg Wound Laterality: Left, Medial Cleanser: Soap and Water 1 x Per Day/30 Days Discharge Instructions: Gently cleanse wound with antibacterial soap, rinse and pat dry prior to dressing wounds Primary Dressing: Gauze 1 x Per Day/30 Days Discharge Instructions: moistened with Dakins Solution Primary Dressing: DAKINS (DME) (Generic) 1 x Per Day/30 Days Discharge Instructions: 1/4 STRENGTH DAKINS MOISTENED GAUZE TO WOUND BED Secondary Dressing: ABD Pad 5x9 (in/in) 1 x Per Day/30 Days Discharge Instructions: Cover with ABD pad Secondary Dressing: Kerlix 4.5 x 4.1 (in/yd) 1 x Per Day/30 Days Discharge Instructions: Apply Kerlix 4.5 x 4.1 (in/yd) as instructed Secured With: 58M Medipore H Soft Cloth Surgical Tape, 2x2 (in/yd) 1  x Per Day/30 Days Wound #3 - Upper Leg Wound Laterality: Left, Lateral Sereno, Vito Berger ([id number]) Cleanser: Soap and Water 1 x Per Day/30 Days Discharge Instructions: Gently cleanse wound with antibacterial soap, rinse and pat dry prior to dressing wounds Primary Dressing: Gauze 1 x Per Day/30 Days Discharge Instructions: moistened with Dakins Solution Primary Dressing: DAKINS (DME) (Generic) 1 x Per Day/30 Days Discharge Instructions: 1/4 STRENGTH DAKINS MOISTENED GAUZE TO WOUND BED Secondary Dressing: ABD Pad 5x9 (in/in) 1 x Per Day/30 Days Discharge Instructions: Cover with ABD pad Secondary Dressing: Kerlix 4.5 x 4.1 (in/yd) 1 x Per Day/30 Days Discharge Instructions: Apply Kerlix 4.5 x 4.1 (in/yd) as instructed Secured With: 58M Medipore H Soft Cloth Surgical Tape, 2x2 (in/yd) 1 x Per Day/30 Days Wound #4 - Calcaneus Wound Laterality: Right Primary Dressing: betadine (DME) (Generic) 1 x Per Day/30 Days Discharge Instructions: paint wound with betadine Secondary Dressing: ABD Pad 5x9 (in/in) (DME) (Generic) 1 x Per Day/30 Days Discharge Instructions: Cover with ABD pad Secondary Dressing: Gauze (DME) (Generic) 1 x Per Day/30 Days Discharge Instructions: As directed: dry, moistened with saline or moistened with Dakins Solution Secondary Dressing: Kerlix 4.5 x 4.1 (in/yd) (DME) (Generic) 1 x Per Day/30 Days Discharge Instructions: Apply Kerlix 4.5 x 4.1 (in/yd) as instructed Secured With: 58M Medipore H Soft Cloth Surgical Tape, 2x2 (in/yd) (DME) (Generic) 1 x Per Day/30 Days Electronic Signature(s) Signed: 12/25/2020 3:44:05 PM By: Worthy Keeler PA-C Signed: 12/30/2020 9:44:48 AM By: Carlene Coria RN Entered By: Carlene Coria on 12/25/2020 12:05:24 Amy Moses ([id number]) -------------------------------------------------------------------------------- Problem List Details Patient Name: Amy Moses Date of Service: 12/25/2020 9:30 AM Medical Record  Number: [id number] Patient  Account Number: [id number] Date of Birth/Sex: 26-Dec-1970 (49 y.o. F) Treating RN: Carlene Coria Primary Care Provider: Zenon Mayo Other Clinician: Referring Provider: Zenon Mayo Treating Provider/Extender: Skipper Cliche in Treatment: 3 Active Problems ICD-10 Encounter Code Description Active Date MDM Diagnosis E83.59 Other disorders of calcium metabolism 12/02/2020 No Yes L97.118 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 12/02/2020 No Yes L97.128 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 12/02/2020 No Yes L89.610 Pressure ulcer of right heel, unstageable 12/02/2020 No Yes E11.42 Type 2 diabetes mellitus with diabetic polyneuropathy 12/02/2020 No Yes Inactive Problems Resolved Problems Electronic Signature(s) Signed: 12/25/2020 10:03:15 AM By: Worthy Keeler PA-C Entered By: Worthy Keeler on 12/25/2020 10:03:15 Amy Moses ([id number]) -------------------------------------------------------------------------------- Progress Note Details Patient Name: Amy Moses Date of Service: 12/25/2020 9:30 AM Medical Record Number: [id number] Patient Account Number: [id number] Date of Birth/Sex: 1971-02-05 (49 y.o. F) Treating RN: Carlene Coria Primary Care Provider: Zenon Mayo Other Clinician: Referring Provider: Zenon Mayo Treating Provider/Extender: Skipper Cliche in Treatment: 3 Subjective Chief Complaint Information obtained from Patient 12/02/2020; patient is here for review of extensive wounds on her bilateral thighs as well as an area on her right plantar heel History of Present Illness (HPI) ADMISSION 12/02/2020 This is a 50 year old woman who is a type II diabetic. Although there are mentions of type 1 diabetes in epic clearly this woman is type II based on the fact that she was on oral agents for 10 years before starting insulin. She also is in chronic renal failure and recently started on dialysis  I think in November. She relates her problems starting in September she started to develop skin excoriation on her bilateral inner thighs. She thought this was a friction phenomenon however the tissue wrist can progressively broke down. She was seen by her primary doctor on 10/21/2020 noted to have erythema on both thighs medially and a blister. MRIs were ordered and she was put on Silvadene and gauze. She was seen in the ER on 12/12 at the [place] clinic also noted to have a right unstageable heel wound. 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 She dialyzes at [facility] in [facility] and she was started on sodium thiosulfate as far as the notes state. The patient states she gets something at dialysis every day although she has not exactly sure what they are giving her. She was noted by vein and vascular in [place] to have multiple open wounds on 11/19/2020 using Xeroform gauze. She had an angiogram booked by Dr. Oneida Alar predominantly I think because of the right heel ulcer although this was canceled by the patient because of the weather. The patient has large necrotic wounds on both inner thighs with covering black eschar. There is also an area on the left lateral thigh and an eschared area on her right plantar heel. She has been using Betadine to the right heel Xeroform to the areas on her legs. The patient has Medicaid but is able to get the Xeroform, were not really sure how she is managing this although she lives in [place] and there may be different rules for Medicaid in [place] versus [place]. We certainly would not be able to get that here. 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 I wondered whether her neuropathy is particularly affected her sensation of pain since her  recent left patella fracture does not seem to have been that painful  either Past medical history includes stage V chronic renal failure starting on dialysis I think in November, clearly type 2 diabetes with neuropathy, hypertension, glaucoma, vitamin D deficiency, history of cholecystectomy, edema of both legs, hypothyroidism, she dialyzes Tuesday Thursday and Saturday at [facility] in [place] The patient had arterial studies on 11/19/2020 I think at vein and vascular in [place]. She had biphasic waveforms throughout the thigh on the right and at the popliteal proximally and distally in the ATA but the PTA and peroneal were not visualized. On the left again biphasic waveforms up to the level of the distal popliteal but not visualized in the tibial vessels. She was felt to have adequate flow where visualized. As noted she was supposed to have an angiogram which I think is certainly indicated [id number] upon evaluation today patient actually appears to be doing okay in regard to her wounds. This is actually the first time of seeing her she saw Dr. Dellia Nims at the last visit she does have quite an extensive history based on review. With that being said I do not see any signs right now of active infection which is great news. Overall I am extremely pleased with where things stand in that regard. 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 I really feel like she may do better with Dakin's moistened gauze. I do think that we can see about ordering the Dakin's for her she is already using gauze and then wrapping with roll gauze anyway so really would not change much except for moistening some gauze and applying it to the wound bed. With that being said I do not see any signs of active infection at this time which is great news. The patient all in all seems to be in fairly good spirits all things considered. Objective Constitutional Well-nourished and well-hydrated in no acute  distress. Vitals Time Taken: 9:40 AM, Height: 66 in, Weight: 235 lbs, BMI: 37.9, Temperature: 98.2 F, Pulse: 74 bpm, Respiratory Rate: 16 breaths/min, Blood Pressure: 175/104 mmHg. Respiratory normal breathing without difficulty. COLENE, RIGLER ([id number]) Psychiatric this patient is able to make decisions and demonstrates good insight into disease process. Alert and Oriented x 3. pleasant and cooperative. General Notes: Upon inspection patient's wound bed actually showed signs of eschar pretty much at all locations. In regard to the thigh regions I do believe this would be best to try to get some of this eschar off in regard to the heel I think the big deal there is try to keep this dry and stable as it appears to be right now. The more of that we can do I think the better it will protected and to be honest we have less likely of an issue of this causing a significant problem for [name]. Nonetheless in regard to the thigh regions I do believe switching to Dakin's moistened gauze would be an optimal way to go here. Integumentary (Hair, Skin) Wound #1 status is Open. Original cause of wound was Gradually Appeared. The wound is located on the Right,Medial Upper Leg. The wound measures 15cm length x 21cm width x 0.4cm depth; 247.4cm^2 area and 98.96cm^3 volume. There is Fat Layer (Subcutaneous Tissue) exposed. There is no tunneling or undermining noted. There is a medium amount of serosanguineous drainage noted. There is small (1-33%) red granulation within the wound bed. There is  a large (67-100%) amount of necrotic tissue within the wound bed including Eschar and Adherent Slough. Wound #2 status is Open. Original cause of wound was Gradually Appeared. The wound is located on the Left,Medial Upper Leg. The wound measures 17cm length x 13cm width x 0.4cm depth; 173.573cm^2 area and 69.429cm^3 volume. There is Fat Layer (Subcutaneous Tissue) exposed. There is no tunneling or undermining noted. There  is a medium amount of serosanguineous drainage noted. There is small (1-33%) red granulation within the wound bed. There is a large (67-100%) amount of necrotic tissue within the wound bed including Eschar and Adherent Slough. Wound #3 status is Open. Original cause of wound was Gradually Appeared. The wound is located on the Left,Lateral Upper Leg. The wound measures 5cm length x 11cm width x 0.1cm depth; 43.197cm^2 area and 4.32cm^3 volume. There is Fat Layer (Subcutaneous Tissue) exposed. There is no tunneling or undermining noted. There is a medium amount of serosanguineous drainage noted. There is small (1-33%) red granulation within the wound bed. There is a large (67-100%) amount of necrotic tissue within the wound bed including Eschar and Adherent Slough. Wound #4 status is Open. Original cause of wound was Gradually Appeared. The wound is located on the Right Calcaneus. The wound measures 5.4cm length x 7.5cm width x 0.2cm depth; 31.809cm^2 area and 6.362cm^3 volume. There is Fat Layer (Subcutaneous Tissue) exposed. There is no tunneling or undermining noted. There is a none present amount of drainage noted. There is no granulation within the wound bed. There is a large (67-100%) amount of necrotic tissue within the wound bed including Eschar. 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 Bathing/ Shower/ Hygiene: May shower; gently cleanse wound with antibacterial soap, rinse and pat dry prior to dressing wounds WOUND #1: - Upper Leg Wound Laterality: Right, Medial Cleanser: Soap and Water 1 x Per Day/30 Days Discharge Instructions: Gently cleanse wound with  antibacterial soap, rinse and pat dry prior to dressing wounds Primary Dressing: Gauze 1 x Per Day/30 Days Discharge Instructions: moistened with Dakins Solution Primary Dressing: DAKINS (DME) (Generic) 1 x Per Day/30 Days Discharge Instructions: 1/4 STRENGTH DAKINS MOISTENED GAUZE TO WOUND BED Secondary Dressing: ABD Pad 5x9 (in/in) 1 x Per Day/30 Days Discharge Instructions: Cover with ABD pad Secondary Dressing: Kerlix 4.5 x 4.1 (in/yd) 1 x Per Day/30 Days Discharge Instructions: Apply Kerlix 4.5 x 4.1 (in/yd) as instructed Secured With: 66M Medipore H Soft Cloth Surgical Tape, 2x2 (in/yd) 1 x Per Day/30 Days WOUND #2: - Upper Leg Wound Laterality: Left, Medial Cleanser: Soap and Water 1 x Per Day/30 Days Discharge Instructions: Gently cleanse wound with antibacterial soap, rinse and pat dry prior to dressing wounds Primary Dressing: Gauze 1 x Per Day/30 Days Discharge Instructions: moistened with Dakins Solution Primary Dressing: DAKINS (DME) (Generic) 1 x Per Day/30 Days AANYA, KOFRON ([id number]) Discharge Instructions: 1/4 STRENGTH DAKINS MOISTENED GAUZE TO WOUND BED Secondary Dressing: ABD Pad 5x9 (in/in) 1 x Per Day/30 Days Discharge Instructions: Cover with ABD pad Secondary Dressing: Kerlix 4.5 x 4.1 (in/yd) 1 x Per Day/30 Days Discharge Instructions: Apply Kerlix 4.5 x 4.1 (in/yd) as instructed Secured With: 66M Medipore H Soft Cloth Surgical Tape, 2x2 (in/yd) 1 x Per Day/30 Days WOUND #3: - Upper Leg Wound  Laterality: Left, Lateral Cleanser: Soap and Water 1 x Per Day/30 Days Discharge Instructions: Gently cleanse wound with antibacterial soap, rinse and pat dry prior to dressing wounds Primary Dressing: Gauze 1 x Per Day/30 Days Discharge Instructions: moistened with Dakins Solution Primary Dressing: DAKINS (DME) (Generic) 1 x Per Day/30 Days Discharge Instructions: 1/4 STRENGTH DAKINS MOISTENED GAUZE TO WOUND BED Secondary Dressing: ABD Pad 5x9 (in/in) 1 x Per Day/30  Days Discharge Instructions: Cover with ABD pad Secondary Dressing: Kerlix 4.5 x 4.1 (in/yd) 1 x Per Day/30 Days Discharge Instructions: Apply Kerlix 4.5 x 4.1 (in/yd) as instructed Secured With: 45M Medipore H Soft Cloth Surgical Tape, 2x2 (in/yd) 1 x Per Day/30 Days WOUND #4: - Calcaneus Wound Laterality: Right Primary Dressing: betadine (DME) (Generic) 1 x Per Day/30 Days Discharge Instructions: paint wound with betadine Secondary Dressing: ABD Pad 5x9 (in/in) (DME) (Generic) 1 x Per Day/30 Days Discharge Instructions: Cover with ABD pad Secondary Dressing: Gauze (DME) (Generic) 1 x Per Day/30 Days Discharge Instructions: As directed: dry, moistened with saline or moistened with Dakins Solution Secondary Dressing: Kerlix 4.5 x 4.1 (in/yd) (DME) (Generic) 1 x Per Day/30 Days Discharge Instructions: Apply Kerlix 4.5 x 4.1 (in/yd) as instructed Secured With: 45M Medipore H Soft Cloth Surgical Tape, 2x2 (in/yd) (DME) (Generic) 1 x Per Day/30 Days 1. I would recommend currently that we go ahead and initiate treatment with Dakin's moistened gauze to the thigh locations. The patient is in agreement with that plan. She will continue to put dry gauze over top of this followed by Kerlix secured with tape. 2. With regard to the heel were in good recommend that she just paint this area with Betadine and then apply dry gauze and they are actually using a maxi pad which seems to do very well for her for both cushioning and catching any drainage to keep anything dry it is doing a great job. 3. I am also can recommend that the patient continue to try to keep pressure off of the heel I think this is of utmost importance. 4. I am also can recommend the continuation of the sodium thiosulfate that is being administered at dialysis and to be honest she seems to be doing okay in regard to nothing seeming to progress based on the best I can tell from pictures from then till now. Overall we will continue to keep a  close eye on things. We will see patient back for reevaluation in 3 weeks here in the clinic. If anything worsens or changes patient will contact our office for additional recommendations. Electronic Signature(s) Signed: 12/25/2020 2:17:11 PM By: Worthy Keeler PA-C Entered By: Worthy Keeler on 12/25/2020 14:17:11 Amy Moses ([id number]) -------------------------------------------------------------------------------- SuperBill Details Patient Name: Amy Moses Date of Service: 12/25/2020 Medical Record Number: [id number] Patient Account Number: [id number] Date of Birth/Sex: [birth year] (49 y.o. 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 L89.610 Pressure ulcer of right heel, unstageable E11.42 Type 2 diabetes mellitus with diabetic polyneuropathy Facility Procedures CPT4 Code: [id number] Description: [id number] - WOUND CARE VISIT-LEV 5 EST PT Modifier: Quantity: 1 Physician Procedures CPT4 Code: [id number] Description: [id number] - WC PHYS LEVEL 3 - EST PT Modifier: Quantity: 1 CPT4 Code: Description: ICD-10 Diagnosis Description  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

## 2020-12-30 NOTE — Progress Notes (Signed)
ISMA, PHILP ([id number]) Visit Report for 12/25/2020 Arrival Information Details Patient Name: Amy Moses, Amy Moses Date of Service: 12/25/2020 9:30 AM Medical Record Number: [id number] Patient Account Number: [id number] Date of Birth/Sex: 04/09/1971 (50 y.o. F) Treating RN: Dolan Amen Primary Care Rudransh Bellanca: Zenon Mayo Other Clinician: Referring Mace Weinberg: Zenon Mayo Treating Merel Santoli/Extender: Skipper Cliche in Treatment: 3 Visit Information History Since Last Visit Has Compression in Place as Prescribed: Yes Patient Arrived: Wheel Chair Pain Present Now: No Arrival Time: 09:39 Accompanied By: daughter Transfer Assistance: EasyPivot Patient Lift Patient Identification Verified: Yes Secondary Verification Process Completed: Yes Electronic Signature(s) Signed: 12/25/2020 11:45:41 AM By: Georges Mouse, Minus Breeding RN Entered By: Georges Mouse, Minus Breeding on 12/25/2020 09:40:02 Amy Moses ([id number]) -------------------------------------------------------------------------------- Clinic Level of Care Assessment Details Patient Name: Amy Moses Date of Service: 12/25/2020 9:30 AM Medical Record Number: [id number] Patient Account Number: [id number] Date of Birth/Sex: 08/19/1971 (49 y.o. F) Treating RN: Carlene Coria Primary Care Rahim Astorga: Zenon Mayo Other Clinician: Referring Serina Nichter: Zenon Mayo Treating Nalla Purdy/Extender: Skipper Cliche in Treatment: 3 Clinic Level of Care Assessment Items TOOL 4 Quantity Score X - Use when only an EandM is performed on FOLLOW-UP visit 1 0 ASSESSMENTS - Nursing Assessment / Reassessment X - Reassessment of Co-morbidities (includes updates in patient status) 1 10 X- 1 5 Reassessment of Adherence to Treatment Plan ASSESSMENTS - Wound and Skin Assessment / Reassessment '[]'$  - Simple Wound Assessment / Reassessment - one wound 0 X- 1 5 Complex Wound Assessment / Reassessment - multiple wounds '[]'$  - 0 Dermatologic / Skin  Assessment (not related to wound area) ASSESSMENTS - Focused Assessment '[]'$  - Circumferential Edema Measurements - multi extremities 0 '[]'$  - 0 Nutritional Assessment / Counseling / Intervention '[]'$  - 0 Lower Extremity Assessment (monofilament, tuning fork, pulses) '[]'$  - 0 Peripheral Arterial Disease Assessment (using hand held doppler) ASSESSMENTS - Ostomy and/or Continence Assessment and Care '[]'$  - Incontinence Assessment and Management 0 '[]'$  - 0 Ostomy Care Assessment and Management (repouching, etc.) PROCESS - Coordination of Care '[]'$  - Simple Patient / Family Education for ongoing care 0 X- 1 20 Complex (extensive) Patient / Family Education for ongoing care X- 1 10 Staff obtains Consents, Records, Test Results / Process Orders '[]'$  - 0 Staff telephones HHA, Nursing Homes / Clarify orders / etc '[]'$  - 0 Routine Transfer to another Facility (non-emergent condition) '[]'$  - 0 Routine Hospital Admission (non-emergent condition) '[]'$  - 0 New Admissions / Biomedical engineer / Ordering NPWT, Apligraf, etc. '[]'$  - 0 Emergency Hospital Admission (emergent condition) X- 1 10 Simple Discharge Coordination '[]'$  - 0 Complex (extensive) Discharge Coordination PROCESS - Special Needs '[]'$  - Pediatric / Minor Patient Management 0 '[]'$  - 0 Isolation Patient Management '[]'$  - 0 Hearing / Language / Visual special needs '[]'$  - 0 Assessment of Community assistance (transportation, D/C planning, etc.) '[]'$  - 0 Additional assistance / Altered mentation '[]'$  - 0 Support Surface(s) Assessment (bed, cushion, seat, etc.) INTERVENTIONS - Wound Cleansing / Measurement Peer, Deane ([id number]) '[]'$  - 0 Simple Wound Cleansing - one wound X- 4 5 Complex Wound Cleansing - multiple wounds X- 1 5 Wound Imaging (photographs - any number of wounds) '[]'$  - 0 Wound Tracing (instead of photographs) '[]'$  - 0 Simple Wound Measurement - one wound X- 4 5 Complex Wound Measurement - multiple wounds INTERVENTIONS - Wound  Dressings '[]'$  - Small Wound Dressing one or multiple wounds 0 '[]'$  - 0 Medium Wound Dressing one or multiple wounds X- 4 20 Large Wound Dressing one or multiple wounds '[]'$  -  0 Application of Medications - topical '[]'$  - 0 Application of Medications - injection INTERVENTIONS - Miscellaneous '[]'$  - External ear exam 0 '[]'$  - 0 Specimen Collection (cultures, biopsies, blood, body fluids, etc.) '[]'$  - 0 Specimen(s) / Culture(s) sent or taken to Lab for analysis '[]'$  - 0 Patient Transfer (multiple staff / Civil Service fast streamer / Similar devices) '[]'$  - 0 Simple Staple / Suture removal (25 or less) '[]'$  - 0 Complex Staple / Suture removal (26 or more) '[]'$  - 0 Hypo / Hyperglycemic Management (close monitor of Blood Glucose) '[]'$  - 0 Ankle / Brachial Index (ABI) - do not check if billed separately X- 1 5 Vital Signs Has the patient been seen at the hospital within the last three years: Yes Total Score: 190 Level Of Care: New/Established - Level 5 Electronic Signature(s) Signed: 12/30/2020 9:44:48 AM By: Carlene Coria RN Entered By: Carlene Coria on 12/25/2020 10:37:47 Amy Moses ([id number]) -------------------------------------------------------------------------------- Lower Extremity Assessment Details Patient Name: Amy Moses Date of Service: 12/25/2020 9:30 AM Medical Record Number: [id number] Patient Account Number: [id number] Date of Birth/Sex: 02-08-1971 (49 y.o. F) Treating RN: Dolan Amen Primary Care Urania Pearlman: Zenon Mayo Other Clinician: Referring Stormy Sabol: Zenon Mayo Treating Kharisma Glasner/Extender: Jeri Cos Weeks in Treatment: 3 Edema Assessment Assessed: [Left: Yes] [Right: Yes] Edema: [Left: No] [Right: No] Calf Left: Right: Point of Measurement: 35 cm From Medial Instep 37.5 cm 40.5 cm Ankle Left: Right: Point of Measurement: 10 cm From Medial Instep 35 cm 26 cm Electronic Signature(s) Signed: 12/25/2020 11:45:41 AM By: Georges Mouse, Minus Breeding RN Entered By: Georges Mouse, Minus Breeding on 12/25/2020 10:03:01 Amy Moses ([id number]) -------------------------------------------------------------------------------- Multi Wound Chart Details Patient Name: Amy Moses Date of Service: 12/25/2020 9:30 AM Medical Record Number: [id number] Patient Account Number: [id number] Date of Birth/Sex: 03-22-1971 (49 y.o. F) Treating RN: Carlene Coria Primary Care Lucia Harm: Zenon Mayo Other Clinician: Referring Una Yeomans: Zenon Mayo Treating Dung Prien/Extender: Skipper Cliche in Treatment: 3 Vital Signs Height(in): 66 Pulse(bpm): 24 Weight(lbs): 235 Blood Pressure(mmHg): 175/104 Body Mass Index(BMI): 38 Temperature(F): 98.2 Respiratory Rate(breaths/min): 16 Photos: Wound Location: Right, Medial Upper Leg Left, Medial Upper Leg Left, Lateral Upper Leg Wounding Event: Gradually Appeared Gradually Appeared Gradually Appeared Primary Etiology: Calciphylaxis Calciphylaxis Calciphylaxis Comorbid History: Cataracts, Glaucoma, Anemia, Cataracts, Glaucoma, Anemia, Cataracts, Glaucoma, Anemia, Hypertension, Type II Diabetes, End Hypertension, Type II Diabetes, End Hypertension, Type II Diabetes, End Stage Renal Disease, Neuropathy Stage Renal Disease, Neuropathy Stage Renal Disease, Neuropathy Date Acquired: 07/22/2020 07/22/2020 07/22/2020 Weeks of Treatment: '3 3 3 '$ Wound Status: Open Open Open Measurements L x W x D (cm) 15x21x0.4 17x13x0.4 5x11x0.1 Area (cm) : 247.4 173.573 43.197 Volume (cm) : 98.96 69.429 4.32 % Reduction in Area: -10.10% -41.70% -71.90% % Reduction in Volume: -340.60% -466.70% -71.90% Classification: Full Thickness Without Exposed Full Thickness Without Exposed Full Thickness Without Exposed Support Structures Support Structures Support Structures Exudate Amount: Medium Medium Medium Exudate Type: Serosanguineous Serosanguineous Serosanguineous Exudate Color: red, brown red, brown red, brown Granulation Amount: Small (1-33%) Small  (1-33%) Small (1-33%) Granulation Quality: Red Red Red Necrotic Amount: Large (67-100%) Large (67-100%) Large (67-100%) Necrotic Tissue: Eschar, Adherent [place] Exposed Structures: Fat Layer (Subcutaneous Tissue): Fat Layer (Subcutaneous Tissue): Fat Layer (Subcutaneous Tissue): Yes Yes Yes Fascia: No Fascia: No Fascia: No Tendon: No Tendon: No Tendon: No Muscle: No Muscle: No Muscle: No Joint: No Joint: No Joint: No Bone: No Bone: No Bone: No Epithelialization: None None None Wound Number: 4 N/A N/A Photos: N/A N/A Amy Moses ([id number]) Wound  Location: Right Calcaneus N/A N/A Wounding Event: Gradually Appeared N/A N/A Primary Etiology: Pressure Ulcer N/A N/A Comorbid History: Cataracts, Glaucoma, Anemia, N/A N/A Hypertension, Type II Diabetes, End Stage Renal Disease, Neuropathy Date Acquired: 10/21/2020 N/A N/A Weeks of Treatment: 3 N/A N/A Wound Status: Open N/A N/A Measurements L x W x D (cm) 5.4x7.5x0.2 N/A N/A Area (cm) : 31.809 N/A N/A Volume (cm) : 6.362 N/A N/A % Reduction in Area: -12.50% N/A N/A % Reduction in Volume: -125.00% N/A N/A Classification: Unstageable/Unclassified N/A N/A Exudate Amount: None Present N/A N/A Exudate Type: N/A N/A N/A Exudate Color: N/A N/A N/A Granulation Amount: None Present (0%) N/A N/A Granulation Quality: N/A N/A N/A Necrotic Amount: Large (67-100%) N/A N/A Necrotic Tissue: Eschar N/A N/A Exposed Structures: Fat Layer (Subcutaneous Tissue): N/A N/A Yes Fascia: No Tendon: No Muscle: No Joint: No Bone: No Epithelialization: None N/A N/A Treatment Notes Electronic Signature(s) Signed: 12/30/2020 9:44:48 AM By: Carlene Coria RN Entered By: Carlene Coria on 12/25/2020 10:18:22 Amy Moses ([id number]) -------------------------------------------------------------------------------- Multi-Disciplinary Care Plan Details Patient Name: Amy Moses Date of  Service: 12/25/2020 9:30 AM Medical Record Number: [id number] Patient Account Number: [id number] Date of Birth/Sex: 06-30-1971 (49 y.o. F) Treating RN: Carlene Coria Primary Care Gurvir Schrom: Zenon Mayo Other Clinician: Referring Haylei Cobin: Zenon Mayo Treating Earvin Blazier/Extender: Skipper Cliche in Treatment: 3 Active Inactive Abuse / Safety / Falls / Self Care Management Nursing Diagnoses: Impaired physical mobility Potential for falls Goals: Patient will not develop complications from immobility Date Initiated: 12/02/2020 Target Resolution Date: 01/02/2021 Goal Status: Active Patient/caregiver will verbalize understanding of skin care regimen Date Initiated: 12/02/2020 Target Resolution Date: 01/02/2021 Goal Status: Active Interventions: Assess fall risk on admission and as needed Provide education on safe transfers Notes: Necrotic Tissue Nursing Diagnoses: Impaired tissue integrity related to necrotic/devitalized tissue Goals: Necrotic/devitalized tissue will be minimized in the wound bed Date Initiated: 12/02/2020 Target Resolution Date: 01/02/2021 Goal Status: Active Patient/caregiver will verbalize understanding of reason and process for debridement of necrotic tissue Date Initiated: 12/02/2020 Target Resolution Date: 01/02/2021 Goal Status: Active Interventions: Assess patient pain level pre-, during and post procedure and prior to discharge Provide education on necrotic tissue and debridement process Notes: Nutrition Nursing Diagnoses: Impaired glucose control: actual or potential Goals: Patient/caregiver agrees to and verbalizes understanding of need to obtain nutritional consultation Date Initiated: 12/02/2020 Target Resolution Date: 01/02/2021 Goal Status: Active Patient/caregiver will maintain therapeutic glucose control Date Initiated: 12/02/2020 Target Resolution Date: 01/02/2021 Goal Status: Active Interventions: Provide education on elevated blood sugars  and impact on wound healing Amy Moses, Amy Moses ([id number]) Notes: Orientation to the Wound Care Program Nursing Diagnoses: Knowledge deficit related to the wound healing center program Goals: Patient/caregiver will verbalize understanding of the [place] Date Initiated: 12/02/2020 Target Resolution Date: 01/02/2021 Goal Status: Active Interventions: Provide education on orientation to the wound center Notes: Peripheral Neuropathy Nursing Diagnoses: Knowledge deficit related to disease process and management of peripheral neurovascular dysfunction Potential alteration in peripheral tissue perfusion (select prior to confirmation of diagnosis) Goals: Patient/caregiver will verbalize understanding of disease process and disease management Date Initiated: 12/02/2020 Target Resolution Date: 01/02/2021 Goal Status: Active Interventions: Assess signs and symptoms of neuropathy upon admission and as needed Provide education on Management of Neuropathy and Related Ulcers Notes: Wound/Skin Impairment Nursing Diagnoses: Impaired tissue integrity Goals: Patient/caregiver will verbalize understanding of skin care regimen Date Initiated: 12/02/2020 Target Resolution Date: 01/02/2021 Goal Status: Active Ulcer/skin breakdown will have a volume reduction of 30% by week 4 Date Initiated: 12/02/2020 Target  Resolution Date: 01/02/2021 Goal Status: Active Interventions: Assess ulceration(s) every visit Treatment Activities: Referred to DME Taija Mathias for dressing supplies : 12/02/2020 Skin care regimen initiated : 12/02/2020 Topical wound management initiated : 12/02/2020 Notes: Electronic Signature(s) Signed: 12/30/2020 9:44:48 AM By: Carlene Coria RN Entered By: Carlene Coria on 12/25/2020 10:17:59 Amy Moses ([id number]) -------------------------------------------------------------------------------- Pain Assessment Details Patient Name: Amy Moses Date of Service:  12/25/2020 9:30 AM Medical Record Number: [id number] Patient Account Number: [id number] Date of Birth/Sex: Nov 23, 1970 (50 y.o. F) Treating RN: Dolan Amen Primary Care Yurika Pereda: Zenon Mayo Other Clinician: Referring Rion Schnitzer: Zenon Mayo Treating Shubh Chiara/Extender: Skipper Cliche in Treatment: 3 Active Problems Location of Pain Severity and Description of Pain Patient Has Paino No Site Locations Rate the pain. Current Pain Level: 0 Pain Management and Medication Current Pain Management: Electronic Signature(s) Signed: 12/25/2020 11:45:41 AM By: Georges Mouse, Minus Breeding RN Entered By: Georges Mouse, Minus Breeding on 12/25/2020 09:44:33 Amy Moses ([id number]) -------------------------------------------------------------------------------- Patient/Caregiver Education Details Patient Name: Amy Moses Date of Service: 12/25/2020 9:30 AM Medical Record Number: [id number] Patient Account Number: [id number] Date of Birth/Gender: [birth year] (49 y.o. F) Treating RN: Carlene Coria Primary Care Physician: Zenon Mayo Other Clinician: Referring Physician: Zenon Mayo Treating Physician/Extender: Skipper Cliche in Treatment: 3 Education Assessment Education Provided To: Patient Education Topics Provided Peripheral Neuropathy: Methods: Explain/Verbal Responses: State content correctly Wound Debridement: Electronic Signature(s) Signed: 12/30/2020 9:44:48 AM By: Carlene Coria RN Entered By: Carlene Coria on 12/25/2020 10:38:44 Amy Moses ([id number]) -------------------------------------------------------------------------------- Wound Assessment Details Patient Name: Amy Moses Date of Service: 12/25/2020 9:30 AM Medical Record Number: [id number] Patient Account Number: [id number] Date of Birth/Sex: 01-05-1971 (49 y.o. F) Treating RN: Dolan Amen Primary Care Hollis Tuller: Zenon Mayo Other Clinician: Referring Itzy Adler: Zenon Mayo Treating  Marlayna Bannister/Extender: Skipper Cliche in Treatment: 3 Wound Status Wound Number: 1 Primary Calciphylaxis Etiology: Wound Location: Right, Medial Upper Leg Wound Open Wounding Event: Gradually Appeared Status: Date Acquired: 07/22/2020 Comorbid Cataracts, Glaucoma, Anemia, Hypertension, Type II Weeks Of Treatment: 3 History: Diabetes, End Stage Renal Disease, Neuropathy Clustered Wound: No Photos Wound Measurements Length: (cm) 15 Width: (cm) 21 Depth: (cm) 0.4 Area: (cm) 247.4 Volume: (cm) 98.96 % Reduction in Area: -10.1% % Reduction in Volume: -340.6% Epithelialization: None Tunneling: No Undermining: No Wound Description Classification: Full Thickness Without Exposed Support Structu Exudate Amount: Medium Exudate Type: Serosanguineous Exudate Color: red, brown res Foul Odor After Cleansing: No Slough/Fibrino Yes Wound Bed Granulation Amount: Small (1-33%) Exposed Structure Granulation Quality: Red Fascia Exposed: No Necrotic Amount: Large (67-100%) Fat Layer (Subcutaneous Tissue) Exposed: Yes Necrotic Quality: Eschar, Adherent Slough Tendon Exposed: No Muscle Exposed: No Joint Exposed: No Bone Exposed: No Electronic Signature(s) Signed: 12/25/2020 11:45:41 AM By: Georges Mouse, Minus Breeding RN Entered By: Georges Mouse, Minus Breeding on 12/25/2020 09:58:14 Amy Moses ([id number]) -------------------------------------------------------------------------------- Wound Assessment Details Patient Name: Amy Moses Date of Service: 12/25/2020 9:30 AM Medical Record Number: [id number] Patient Account Number: [id number] Date of Birth/Sex: Jan 03, 1971 (50 y.o. F) Treating RN: Dolan Amen Primary Care Cairo Lingenfelter: Zenon Mayo Other Clinician: Referring Lancer Thurner: Zenon Mayo Treating Verlinda Slotnick/Extender: Skipper Cliche in Treatment: 3 Wound Status Wound Number: 2 Primary Calciphylaxis Etiology: Wound Location: Left, Medial Upper Leg Wound Open Wounding Event:  Gradually Appeared Status: Date Acquired: 07/22/2020 Comorbid Cataracts, Glaucoma, Anemia, Hypertension, Type II Weeks Of Treatment: 3 History: Diabetes, End Stage Renal Disease, Neuropathy Clustered Wound: No Photos Wound Measurements Length: (cm) 17 Width: (cm) 13 Depth: (cm) 0.4 Area: (cm) 173.573 Volume: (cm) 69.429 % Reduction in Area: -41.7% % Reduction in Volume: -466.7% Epithelialization:  None Tunneling: No Undermining: No Wound Description Classification: Full Thickness Without Exposed Support Structu Exudate Amount: Medium Exudate Type: Serosanguineous Exudate Color: red, brown res Foul Odor After Cleansing: No Slough/Fibrino Yes Wound Bed Granulation Amount: Small (1-33%) Exposed Structure Granulation Quality: Red Fascia Exposed: No Necrotic Amount: Large (67-100%) Fat Layer (Subcutaneous Tissue) Exposed: Yes Necrotic Quality: Eschar, Adherent Slough Tendon Exposed: No Muscle Exposed: No Joint Exposed: No Bone Exposed: No Electronic Signature(s) Signed: 12/25/2020 11:45:41 AM By: Georges Mouse, Minus Breeding RN Entered By: Georges Mouse, Minus Breeding on 12/25/2020 10:00:15 Amy Moses ([id number]) -------------------------------------------------------------------------------- Wound Assessment Details Patient Name: Amy Moses Date of Service: 12/25/2020 9:30 AM Medical Record Number: [id number] Patient Account Number: [id number] Date of Birth/Sex: March 17, 1971 (50 y.o. F) Treating RN: Dolan Amen Primary Care Strider Vallance: Zenon Mayo Other Clinician: Referring Treonna Klee: Zenon Mayo Treating Niccolas Loeper/Extender: Skipper Cliche in Treatment: 3 Wound Status Wound Number: 3 Primary Calciphylaxis Etiology: Wound Location: Left, Lateral Upper Leg Wound Open Wounding Event: Gradually Appeared Status: Date Acquired: 07/22/2020 Comorbid Cataracts, Glaucoma, Anemia, Hypertension, Type II Weeks Of Treatment: 3 History: Diabetes, End Stage Renal Disease,  Neuropathy Clustered Wound: No Photos Wound Measurements Length: (cm) 5 Width: (cm) 11 Depth: (cm) 0.1 Area: (cm) 43.197 Volume: (cm) 4.32 % Reduction in Area: -71.9% % Reduction in Volume: -71.9% Epithelialization: None Tunneling: No Undermining: No Wound Description Classification: Full Thickness Without Exposed Support Structu Exudate Amount: Medium Exudate Type: Serosanguineous Exudate Color: red, brown res Foul Odor After Cleansing: No Slough/Fibrino Yes Wound Bed Granulation Amount: Small (1-33%) Exposed Structure Granulation Quality: Red Fascia Exposed: No Necrotic Amount: Large (67-100%) Fat Layer (Subcutaneous Tissue) Exposed: Yes Necrotic Quality: Eschar, Adherent Slough Tendon Exposed: No Muscle Exposed: No Joint Exposed: No Bone Exposed: No Electronic Signature(s) Signed: 12/25/2020 11:45:41 AM By: Georges Mouse, Minus Breeding RN Entered By: Georges Mouse, Minus Breeding on 12/25/2020 10:01:06 Amy Moses ([id number]) -------------------------------------------------------------------------------- Wound Assessment Details Patient Name: Amy Moses Date of Service: 12/25/2020 9:30 AM Medical Record Number: [id number] Patient Account Number: [id number] Date of Birth/Sex: October 10, 1971 (50 y.o. F) Treating RN: Dolan Amen Primary Care Arienne Gartin: Zenon Mayo Other Clinician: Referring Kaleeah Gingerich: Zenon Mayo Treating Jaccob Czaplicki/Extender: Skipper Cliche in Treatment: 3 Wound Status Wound Number: 4 Primary Pressure Ulcer Etiology: Wound Location: Right Calcaneus Wound Open Wounding Event: Gradually Appeared Status: Date Acquired: 10/21/2020 Comorbid Cataracts, Glaucoma, Anemia, Hypertension, Type II Weeks Of Treatment: 3 History: Diabetes, End Stage Renal Disease, Neuropathy Clustered Wound: No Photos Wound Measurements Length: (cm) 5.4 Width: (cm) 7.5 Depth: (cm) 0.2 Area: (cm) 31.809 Volume: (cm) 6.362 % Reduction in Area: -12.5% % Reduction in  Volume: -125% Epithelialization: None Tunneling: No Undermining: No Wound Description Classification: Unstageable/Unclassified Exudate Amount: None Present Foul Odor After Cleansing: No Slough/Fibrino No Wound Bed Granulation Amount: None Present (0%) Exposed Structure Necrotic Amount: Large (67-100%) Fascia Exposed: No Necrotic Quality: Eschar Fat Layer (Subcutaneous Tissue) Exposed: Yes Tendon Exposed: No Muscle Exposed: No Joint Exposed: No Bone Exposed: No Electronic Signature(s) Signed: 12/25/2020 11:45:41 AM By: Georges Mouse, Minus Breeding RN Entered By: Georges Mouse, Minus Breeding on 12/25/2020 10:01:35 Amy Moses ([id number]) -------------------------------------------------------------------------------- Vitals Details Patient Name: Amy Moses Date of Service: 12/25/2020 9:30 AM Medical Record Number: [id number] Patient Account Number: [id number] Date of Birth/Sex: [birth year] (50 y.o. F) Treating RN: Dolan Amen Primary Care Jezebel Pollet: Zenon Mayo Other Clinician: Referring Cristin Penaflor: Zenon Mayo Treating Devi Hopman/Extender: Skipper Cliche in Treatment: 3 Vital Signs Time Taken: 09:40 Temperature (F): 98.2 Height (in): 66 Pulse (bpm): 74 Weight (lbs): 235 Respiratory Rate (breaths/min): 16 Body Mass Index (BMI): 37.9 Blood  Pressure (mmHg): 175/104 Reference Range: 80 - 120 mg / dl Electronic Signature(s) Signed: 12/25/2020 11:45:41 AM By: Georges Mouse, Minus Breeding RN Entered By: Georges Mouse, Minus Breeding on 12/25/2020 09:41:40

## 2020-12-31 ENCOUNTER — Encounter (HOSPITAL_BASED_OUTPATIENT_CLINIC_OR_DEPARTMENT_OTHER): Payer: Medicaid - Out of State | Admitting: Internal Medicine

## 2021-01-15 ENCOUNTER — Encounter: Payer: Medicaid - Out of State | Admitting: Physician Assistant

## 2021-01-15 ENCOUNTER — Other Ambulatory Visit: Payer: Self-pay

## 2021-01-15 DIAGNOSIS — E11622 Type 2 diabetes mellitus with other skin ulcer: Secondary | ICD-10-CM | POA: Diagnosis not present

## 2021-01-15 NOTE — Progress Notes (Addendum)
AHMANI, POEPPELMAN ([id number]) Visit Report for 01/15/2021 Chief Complaint Document Details Patient Name: Amy Moses, Amy Moses Date of Service: 01/15/2021 10:15 AM Medical Record Number: [id number] Patient Account Number: [id number] Date of Birth/Sex: [birth year] (50 y.o. F) Treating RN: Carlene Coria Primary Care Provider: Zenon Mayo Other Clinician: Referring Provider: Zenon Mayo Treating Provider/Extender: Skipper Cliche in Treatment: 6 Information Obtained from: Patient Chief Complaint 12/02/2020; patient is here for review of extensive wounds on her bilateral thighs as well as an area on her right plantar heel Electronic Signature(s) Signed: 01/15/2021 10:30:21 AM By: Worthy Keeler PA-C Entered By: Worthy Keeler on 01/15/2021 10:30:21 Amy Moses ([id number]) -------------------------------------------------------------------------------- Debridement Details Patient Name: Amy Moses Date of Service: 01/15/2021 10:15 AM Medical Record Number: [id number] Patient Account Number: [id number] Date of Birth/Sex: 01/09/1971 (50 y.o. F) Treating RN: Carlene Coria Primary Care Provider: Zenon Mayo Other Clinician: Referring Provider: Zenon Mayo Treating Provider/Extender: Skipper Cliche in Treatment: 6 Debridement Performed for Wound #2 Left,Medial Upper Leg Assessment: Performed By: Physician Tommie Sams., PA-C Debridement Type: Debridement Level of Consciousness (Pre- Awake and Alert procedure): Pre-procedure Verification/Time Out Yes - 11:25 Taken: Start Time: 11:25 Pain Control: Lidocaine 5% topical ointment Total Area Debrided (L x W): 16 (cm) x 13 (cm) = 208 (cm) Tissue and other material Viable, Non-Viable, Eschar, Fat, Slough, Subcutaneous, Skin: Dermis , Skin: Epidermis, Slough debrided: Level: Skin/Subcutaneous Tissue Debridement Description: Excisional Instrument: Forceps, Scissors Bleeding: Minimum Hemostasis Achieved: Pressure End  Time: 12:10 Procedural Pain: 0 Post Procedural Pain: 0 Response to Treatment: Procedure was tolerated well Level of Consciousness (Post- Awake and Alert procedure): Post Debridement Measurements of Total Wound Length: (cm) 16 Width: (cm) 13 Depth: (cm) 1.6 Volume: (cm) 261.381 Character of Wound/Ulcer Post Debridement: Improved Post Procedure Diagnosis Same as Pre-procedure Electronic Signature(s) Signed: 01/15/2021 4:44:45 PM By: Worthy Keeler PA-C Signed: 01/19/2021 4:52:45 PM By: Carlene Coria RN Entered By: Carlene Coria on 01/15/2021 12:38:48 Amy Moses ([id number]) -------------------------------------------------------------------------------- Debridement Details Patient Name: Amy Moses Date of Service: 01/15/2021 10:15 AM Medical Record Number: [id number] Patient Account Number: [id number] Date of Birth/Sex: [birth year] (50 y.o. F) Treating RN: Carlene Coria Primary Care Provider: Zenon Mayo Other Clinician: Referring Provider: Zenon Mayo Treating Provider/Extender: Skipper Cliche in Treatment: 6 Debridement Performed for Wound #1 Right,Medial Upper Leg Assessment: Performed By: Physician Tommie Sams., PA-C Debridement Type: Debridement Level of Consciousness (Pre- Awake and Alert procedure): Pre-procedure Verification/Time Out Yes - 11:25 Taken: Start Time: 11:25 Pain Control: Lidocaine 5% topical ointment Total Area Debrided (L x W): 6 (cm) x 12 (cm) = 72 (cm) Tissue and other material Viable, Non-Viable, Eschar, Fat, Slough, Subcutaneous, Skin: Dermis , Skin: Epidermis, Slough debrided: Level: Skin/Subcutaneous Tissue Debridement Description: Excisional Instrument: Forceps, Scissors Bleeding: Moderate Hemostasis Achieved: Silver Nitrate End Time: 12:10 Procedural Pain: 0 Post Procedural Pain: 0 Response to Treatment: Procedure was tolerated well Level of Consciousness (Post- Awake and Alert procedure): Post Debridement  Measurements of Total Wound Length: (cm) 12 Width: (cm) 23 Depth: (cm) 1.8 Volume: (cm) 390.186 Character of Wound/Ulcer Post Debridement: Improved Post Procedure Diagnosis Same as Pre-procedure Electronic Signature(s) Signed: 01/19/2021 8:56:45 AM By: Worthy Keeler PA-C Signed: 01/19/2021 4:52:45 PM By: Carlene Coria RN Previous Signature: 01/15/2021 4:44:45 PM Version By: Worthy Keeler PA-C Entered By: Worthy Keeler on 01/19/2021 08:56:45 Amy Moses ([id number]) -------------------------------------------------------------------------------- HPI Details Patient Name: Amy Moses Date of Service: 01/15/2021 10:15 AM Medical Record Number: [id number] Patient Account Number: [id number] Date of Birth/Sex: 03-06-1971 (50 y.o. F) Treating  RN: Carlene Coria Primary Care Provider: Zenon Mayo Other Clinician: Referring Provider: Zenon Mayo Treating Provider/Extender: Skipper Cliche in Treatment: 6 History of Present Illness HPI Description: ADMISSION 12/02/2020 This is a 50 year old woman who is a type II diabetic. Although there are mentions of type 1 diabetes in epic clearly this woman is type II based on the fact that she was on oral agents for 10 years before starting insulin. She also is in chronic renal failure and recently started on dialysis I think in November. She relates her problems starting in September she started to develop skin excoriation on her bilateral inner thighs. She thought this was a friction phenomenon however the tissue wrist can progressively broke down. She was seen by her primary doctor on 10/21/2020 noted to have erythema on both thighs medially and a blister. MRIs were ordered and she was put on Silvadene and gauze. She was seen in the ER on 12/12 at the [place] clinic also noted to have a right unstageable heel wound. 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 She dialyzes at [facility] in  [facility] and she was started on sodium thiosulfate as far as the notes state. The patient states she gets something at dialysis every day although she has not exactly sure what they are giving her. She was noted by vein and vascular in [place] to have multiple open wounds on 11/19/2020 using Xeroform gauze. She had an angiogram booked by Dr. Oneida Alar predominantly I think because of the right heel ulcer although this was canceled by the patient because of the weather. The patient has large necrotic wounds on both inner thighs with covering black eschar. There is also an area on the left lateral thigh and an eschared area on her right plantar heel. She has been using Betadine to the right heel Xeroform to the areas on her legs. The patient has Medicaid but is able to get the Xeroform, were not really sure how she is managing this although she lives in [place] and there may be different rules for Medicaid in [place] versus [place]. We certainly would not be able to get that here. 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 I wondered whether her neuropathy is particularly affected her sensation of pain since her recent left patella fracture does not seem to have been that painful either Past medical history includes stage V chronic renal failure starting on dialysis I think in November, clearly type 2 diabetes with neuropathy, hypertension, glaucoma, vitamin D deficiency, history of cholecystectomy, edema of both legs, hypothyroidism, she dialyzes Tuesday Thursday and Saturday at [facility] in [place] The patient had arterial studies on 11/19/2020 I think at vein and vascular in [place]. She had biphasic waveforms throughout the thigh on the right and at the popliteal proximally and distally in the ATA but the PTA and peroneal were not visualized. On the left again biphasic waveforms up to  the level of the distal popliteal but not visualized in the tibial vessels. She was felt to have adequate flow where visualized. As noted she was supposed to have an angiogram which I think is certainly indicated [id number] upon evaluation today patient actually appears to be doing okay in regard to her wounds. This is actually the first time of seeing her she saw Dr. Dellia Nims at the last visit she does have quite an extensive history based on review.  With that being said I do not see any signs right now of active infection which is great news. Overall I am extremely pleased with where things stand in that regard. 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 I really feel like she may do better with Dakin's moistened gauze. I do think that we can see about ordering the Dakin's for her she is already using gauze and then wrapping with roll gauze anyway so really would not change much except for moistening some gauze and applying it to the wound bed. With that being said I do not see any signs of active infection at this time which is great news. The patient all in all seems to be in fairly good spirits all things considered. 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 Fortunately it looks like some of the eschar is loosening up so we should be able to get this removed which hopefully will help in speeding up the healing process. With that being said with regard to the patient's gluteal region she unfortunately has new pressure ulcerations open today which I think could benefit from possibly having a air mattress. With that being said I explained to the patient that unfortunately this could still take some time as far as getting it to heal completely. There does not appear to be any signs  of active infection at this time which is great news. No fevers, chills, nausea, vomiting, or diarrhea. Electronic Signature(s) Signed: 01/15/2021 1:45:03 PM By: Worthy Keeler PA-C Entered By: Worthy Keeler on 01/15/2021 13:45:03 Amy Moses ([id number]) -------------------------------------------------------------------------------- Physical Exam Details Patient Name: Amy Moses Date of Service: 01/15/2021 10:15 AM Medical Record Number: [id number] Patient Account Number: [id number] Date of Birth/Sex: 17-Nov-1971 (49 y.o. F) Treating RN: Carlene Coria Primary Care Provider: Zenon Mayo Other Clinician: Referring Provider: Zenon Mayo Treating Provider/Extender: Skipper Cliche in Treatment: 6 Constitutional Well-nourished and well-hydrated in no acute distress. Respiratory normal breathing without difficulty. Psychiatric this patient is able to make decisions and demonstrates good insight into disease process. Alert and Oriented x 3. pleasant and cooperative. Notes Upon inspection today I did perform sharp debridement in regard to the wounds which are secondary to calciphylaxis on the right medial upper leg and left medial upper leg. Again only removed about half of the eschar on the right as the other half was fairly firmly attached and I really was not able to get that loose today. On the left however we remove the entire region both of these were fairly substantial areas only area where we had any bleeding was just a small superficial capillary which did want to bleed on the right side I used silver nitrate to chemically cauterize that area and was able to get cessation of the bleeding fairly quickly. Overall I feel like the patient is doing quite well currently. Still she has a very significant wound here that can take quite a bit of time to heal. Electronic Signature(s) Signed: 01/15/2021 1:46:04 PM By: Worthy Keeler PA-C Entered By: Worthy Keeler on  01/15/2021 13:46:03 Amy Moses ([id number]) -------------------------------------------------------------------------------- Physician Orders Details Patient Name: Amy Moses Date of Service: 01/15/2021 10:15 AM Medical Record Number: [id number] Patient Account Number: [id number] Date of Birth/Sex: 05-23-1971 (49 y.o. F) Treating RN: Carlene Coria Primary Care Provider: Huel Cote,  Aletha Other Clinician: Referring Provider: Zenon Mayo Treating Provider/Extender: Skipper Cliche in Treatment: 6 Verbal / Phone Orders: No Diagnosis Coding ICD-10 Coding Code Description E83.59 Other disorders of calcium metabolism L97.118 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 L97.128 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 L89.610 Pressure ulcer of right heel, unstageable E11.42 Type 2 diabetes mellitus with diabetic polyneuropathy Follow-up Appointments o Return Appointment in 3 weeks. Bathing/ Shower/ Hygiene o May shower; gently cleanse wound with antibacterial soap, rinse and pat dry prior to dressing wounds Edema Control - Lymphedema / Segmental Compressive Device / Other o Elevate, Exercise Daily and Avoid Standing for Long Periods of Time. o Elevate legs to the level of the heart and pump ankles as often as possible o Elevate leg(s) parallel to the floor when sitting. Off-Loading o Hospital bed/mattress o Low air-loss mattress (Group 2) o Turn and reposition every 2 hours Wound Treatment Wound #1 - Upper Leg Wound Laterality: Right, Medial Cleanser: Soap and Water (Generic) 1 x Per Day/30 Days Discharge Instructions: Gently cleanse wound with antibacterial soap, rinse and pat dry prior to dressing wounds Primary Dressing: Gauze (DME) (Generic) 1 x Per Day/30 Days Discharge Instructions: As directed: moistened with 1/4 strength Dakins Solution Secondary Dressing: ABD Pad 5x9 (in/in) (DME) (Generic) 1  x Per Day/30 Days Discharge Instructions: Cover with ABD pad Secondary Dressing: Kerlix 4.5 x 4.1 (in/yd) (DME) (Generic) 1 x Per Day/30 Days Discharge Instructions: Apply Kerlix 4.5 x 4.1 (in/yd) as instructed Secured With: 50M Medipore H Soft Cloth Surgical Tape, 2x2 (in/yd) (DME) (Generic) 1 x Per Day/30 Days Wound #2 - Upper Leg Wound Laterality: Left, Medial Cleanser: Soap and Water (Generic) 1 x Per Day/30 Days Discharge Instructions: Gently cleanse wound with antibacterial soap, rinse and pat dry prior to dressing wounds Primary Dressing: Gauze (DME) (Generic) 1 x Per Day/30 Days Discharge Instructions: As directed: moistened with 1/4 strength Dakins Solution Secondary Dressing: ABD Pad 5x9 (in/in) (DME) (Generic) 1 x Per Day/30 Days Discharge Instructions: Cover with ABD pad Secondary Dressing: Kerlix 4.5 x 4.1 (in/yd) (DME) (Generic) 1 x Per Day/30 Days Discharge Instructions: Apply Kerlix 4.5 x 4.1 (in/yd) as instructed Amy Moses, Amy Moses ([id number]) Secured With: 50M Medipore H Soft Cloth Surgical Tape, 2x2 (in/yd) (DME) (Generic) 1 x Per Day/30 Days Wound #3 - Upper Leg Wound Laterality: Left, Lateral Cleanser: Soap and Water (Generic) 1 x Per Day/30 Days Discharge Instructions: Gently cleanse wound with antibacterial soap, rinse and pat dry prior to dressing wounds Primary Dressing: Gauze (DME) (Generic) 1 x Per Day/30 Days Discharge Instructions: As directed: moistened with 1/4 strength Dakins Solution Secondary Dressing: ABD Pad 5x9 (in/in) (DME) (Generic) 1 x Per Day/30 Days Discharge Instructions: Cover with ABD pad Secondary Dressing: Kerlix 4.5 x 4.1 (in/yd) (DME) (Generic) 1 x Per Day/30 Days Discharge Instructions: Apply Kerlix 4.5 x 4.1 (in/yd) as instructed Secured With: 50M Medipore H Soft Cloth Surgical Tape, 2x2 (in/yd) (DME) (Generic) 1 x Per Day/30 Days Wound #4 - Calcaneus Wound Laterality: Right Cleanser: Soap and Water (Generic) 1 x Per Day/30 Days Discharge  Instructions: Gently cleanse wound with antibacterial soap, rinse and pat dry prior to dressing wounds Topical: Betadine 1 x Per Day/30 Days Discharge Instructions: paint with betadine Wound #5 - Upper Leg Wound Laterality: Right, Posterior Cleanser: Soap and Water (Generic) 1 x Per Day/30 Days Discharge Instructions: Gently cleanse wound with antibacterial soap, rinse and pat dry prior to dressing wounds Primary Dressing: Silvercel 4  1/4x 4 1/4 (in/in) (DME) (Generic) 1 x Per Day/30 Days Discharge Instructions: Apply Silvercel 4 1/4x 4 1/4 (in/in) as instructed Secondary Dressing: ABD Pad 5x9 (in/in) (DME) (Generic) 1 x Per Day/30 Days Discharge Instructions: Cover with ABD pad Secured With: 74M Medipore H Soft Cloth Surgical Tape, 2x2 (in/yd) (DME) (Generic) 1 x Per Day/30 Days Wound #6 - Gluteus Wound Laterality: Right Cleanser: Soap and Water (Generic) 1 x Per Day/30 Days Discharge Instructions: Gently cleanse wound with antibacterial soap, rinse and pat dry prior to dressing wounds Primary Dressing: Silvercel 4 1/4x 4 1/4 (in/in) (DME) (Generic) 1 x Per Day/30 Days Discharge Instructions: Apply Silvercel 4 1/4x 4 1/4 (in/in) as instructed Secondary Dressing: ABD Pad 5x9 (in/in) (DME) (Generic) 1 x Per Day/30 Days Discharge Instructions: Cover with ABD pad Secured With: 74M Medipore H Soft Cloth Surgical Tape, 2x2 (in/yd) (DME) (Generic) 1 x Per Day/30 Days Wound #7 - Gluteal fold Wound Laterality: Left Cleanser: Soap and Water (Generic) 1 x Per Day/30 Days Discharge Instructions: Gently cleanse wound with antibacterial soap, rinse and pat dry prior to dressing wounds Primary Dressing: Silvercel 4 1/4x 4 1/4 (in/in) (DME) (Generic) 1 x Per Day/30 Days Discharge Instructions: Apply Silvercel 4 1/4x 4 1/4 (in/in) as instructed Secondary Dressing: ABD Pad 5x9 (in/in) (DME) (Generic) 1 x Per Day/30 Days Discharge Instructions: Cover with ABD pad Secured With: 74M Medipore H Soft Cloth Surgical  Tape, 2x2 (in/yd) (DME) (Generic) 1 x Per Day/30 Days Wound #8 - Gluteus Wound Laterality: Left Cleanser: Soap and Water (Generic) 1 x Per Day/30 Days Discharge Instructions: Gently cleanse wound with antibacterial soap, rinse and pat dry prior to dressing wounds Amy Moses, Amy Moses ([id number]) Primary Dressing: Silvercel 4 1/4x 4 1/4 (in/in) (DME) (Generic) 1 x Per Day/30 Days Discharge Instructions: Apply Silvercel 4 1/4x 4 1/4 (in/in) as instructed Secondary Dressing: ABD Pad 5x9 (in/in) (DME) (Generic) 1 x Per Day/30 Days Discharge Instructions: Cover with ABD pad Secured With: 74M Medipore H Soft Cloth Surgical Tape, 2x2 (in/yd) (DME) (Generic) 1 x Per Day/30 Days Electronic Signature(s) Signed: 01/19/2021 4:50:13 PM By: Worthy Keeler PA-C Signed: 01/19/2021 4:52:45 PM By: Carlene Coria RN Previous Signature: 01/15/2021 4:44:45 PM Version By: Worthy Keeler PA-C Entered By: Carlene Coria on 01/19/2021 15:35:53 Amy Moses ([id number]) -------------------------------------------------------------------------------- Problem List Details Patient Name: Amy Moses Date of Service: 01/15/2021 10:15 AM Medical Record Number: [id number] Patient Account Number: [id number] Date of Birth/Sex: Jul 09, 1971 (50 y.o. F) Treating RN: Carlene Coria Primary Care Provider: Zenon Mayo Other Clinician: Referring Provider: Zenon Mayo Treating Provider/Extender: Skipper Cliche in Treatment: 6 Active Problems ICD-10 Encounter Code Description Active Date MDM Diagnosis E83.59 Other disorders of calcium metabolism 12/02/2020 No Yes L97.118 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 12/02/2020 No Yes L97.128 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 12/02/2020 No Yes L89.610 Pressure ulcer of right heel, unstageable 12/02/2020 No Yes E11.42 Type 2 diabetes mellitus with diabetic polyneuropathy 12/02/2020 No Yes L89.313 Pressure ulcer of  right buttock, stage 3 01/19/2021 No Yes L89.323 Pressure ulcer of left buttock, stage 3 01/19/2021 No Yes Inactive Problems Resolved Problems Electronic Signature(s) Signed: 01/19/2021 10:05:39 AM By: Worthy Keeler PA-C Previous Signature: 01/15/2021 10:30:09 AM Version By: Worthy Keeler PA-C Entered By: Worthy Keeler on 01/19/2021 10:05:39 Amy Moses ([id number]) -------------------------------------------------------------------------------- Progress Note Details Patient Name: Amy Moses Date of Service: 01/15/2021 10:15 AM Medical Record Number: [id number] Patient Account Number: [id number] Date of Birth/Sex: Aug 18, 1971 (49 y.o.  F) Treating RN: Carlene Coria Primary Care Provider: Zenon Mayo Other Clinician: Referring Provider: Zenon Mayo Treating Provider/Extender: Skipper Cliche in Treatment: 6 Subjective Chief Complaint Information obtained from Patient 12/02/2020; patient is here for review of extensive wounds on her bilateral thighs as well as an area on her right plantar heel History of Present Illness (HPI) ADMISSION 12/02/2020 This is a 50 year old woman who is a type II diabetic. Although there are mentions of type 1 diabetes in epic clearly this woman is type II based on the fact that she was on oral agents for 10 years before starting insulin. She also is in chronic renal failure and recently started on dialysis I think in November. She relates her problems starting in September she started to develop skin excoriation on her bilateral inner thighs. She thought this was a friction phenomenon however the tissue wrist can progressively broke down. She was seen by her primary doctor on 10/21/2020 noted to have erythema on both thighs medially and a blister. MRIs were ordered and she was put on Silvadene and gauze. She was seen in the ER on 12/12 at the [place] clinic also noted to have a right unstageable heel wound. I think this was discussed with  nephrology and it was felt to be "" clearly calciphylaxis. She dialyzes at [facility] in [facility] and she was started on sodium thiosulfate as far as the notes state. The patient states she gets something at dialysis every day although she has not exactly sure what they are giving her. She was noted by vein and vascular in [place] to have multiple open wounds on 11/19/2020 using Xeroform gauze. She had an angiogram booked by Dr. Oneida Alar predominantly I think because of the right heel ulcer although this was canceled by the patient because of the weather. The patient has large necrotic wounds on both inner thighs with covering black eschar. There is also an area on the left lateral thigh and an eschared area on her right plantar heel. She has been using Betadine to the right heel Xeroform to the areas on her legs. The patient has Medicaid but is able to get the Xeroform, were not really sure how she is managing this although she lives in [place] and there may be different rules for Medicaid in [place] versus [place]. We certainly would not be able to get that here. 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 I wondered whether her neuropathy is particularly affected her sensation of pain since her recent left patella fracture does not seem to have been that painful either Past medical history includes stage V chronic renal failure starting on dialysis I think in November, clearly type 2 diabetes with neuropathy, hypertension, glaucoma, vitamin D deficiency, history of cholecystectomy, edema of both legs, hypothyroidism, she dialyzes Tuesday Thursday and Saturday at [facility] in [place] The patient had arterial studies on 11/19/2020 I think at vein and vascular in [place]. She had biphasic waveforms throughout the thigh on the right and at the popliteal proximally and distally in the ATA but  the PTA and peroneal were not visualized. On the left again biphasic waveforms up to the level of the distal popliteal but not visualized in the tibial vessels. She was felt to have adequate flow where visualized. As noted she was supposed to have an angiogram which I think is certainly indicated [id number] upon evaluation today patient actually appears to be doing okay in  regard to her wounds. This is actually the first time of seeing her she saw Dr. Dellia Nims at the last visit she does have quite an extensive history based on review. With that being said I do not see any signs right now of active infection which is great news. Overall I am extremely pleased with where things stand in that regard. 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 I really feel like she may do better with Dakin's moistened gauze. I do think that we can see about ordering the Dakin's for her she is already using gauze and then wrapping with roll gauze anyway so really would not change much except for moistening some gauze and applying it to the wound bed. With that being said I do not see any signs of active infection at this time which is great news. The patient all in all seems to be in fairly good spirits all things considered. 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 Fortunately it looks like some of the eschar is loosening up so we should be able to get this removed which hopefully will help in speeding up the healing process. With that being said with regard to the patient's gluteal region she unfortunately has new pressure ulcerations open today which I think could benefit from possibly having a air mattress. With that being said I explained to the patient that unfortunately this could still take  some time as far as getting it to heal completely. There does not appear to be any signs of active infection at this time which is great news. No fevers, chills, nausea, vomiting, or diarrhea. Objective Constitutional Well-nourished and well-hydrated in no acute distress. Vitals Time Taken: 10:38 AM, Height: 66 in, Weight: 235 lbs, BMI: 37.9, Temperature: 97.9 F, Pulse: 67 bpm, Respiratory Rate: 18 breaths/min, Crafton, Dalicia ([id number]) Blood Pressure: 163/93 mmHg. Respiratory normal breathing without difficulty. Psychiatric this patient is able to make decisions and demonstrates good insight into disease process. Alert and Oriented x 3. pleasant and cooperative. 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 Again only removed about half of the eschar on the right as the other half was fairly firmly attached and I really was not able to get that loose today. On the left however we remove the entire region both of these were fairly substantial areas only area where we had any bleeding was just a small superficial capillary which did want to bleed on the right side I used silver nitrate to chemically cauterize that area and was able to get cessation of the bleeding fairly quickly. Overall I feel like the patient is doing quite well currently. Still she has a very significant wound here that can take quite a bit of time to heal. Integumentary (Hair, Skin) Wound #1 status is Open. Original cause of wound was Gradually Appeared. The date acquired was: 07/22/2020. The wound has been in treatment 6 weeks. The wound is located on the Right,Medial Upper Leg. The wound measures 12cm length x 23cm width x 1.8cm depth; 216.77cm^2 area and 390.186cm^3 volume. There is Fat Layer (Subcutaneous Tissue) exposed. There is no tunneling or undermining noted. There is a  medium amount of serosanguineous drainage noted. There is small (1-33%) red granulation within the wound bed. There is a large (67-100%) amount of necrotic tissue within the wound bed including Eschar and Adherent Slough. Wound #2 status is Open. Original cause of wound was Gradually Appeared. The date acquired was: 07/22/2020. The wound has been in treatment 6 weeks. The wound is located on the Left,Medial Upper Leg. The wound measures 16cm length x 13cm width x 1.6cm depth; 163.363cm^2 area and 261.381cm^3 volume. There is Fat Layer (Subcutaneous Tissue) exposed. There is no tunneling or undermining noted. There is a medium amount of serosanguineous drainage noted. There is small (1-33%) red granulation within the wound bed. There is a large (67-100%) amount of necrotic tissue within the wound bed including Eschar and Adherent Slough. Wound #3 status is Open. Original cause of wound was Gradually Appeared. The date acquired was: 07/22/2020. The wound has been in treatment 6 weeks. The wound is located on the Left,Lateral Upper Leg. The wound measures 15cm length x 5cm width x 0.2cm depth; 58.905cm^2 area and 11.781cm^3 volume. There is Fat Layer (Subcutaneous Tissue) exposed. There is no tunneling or undermining noted. There is a medium amount of serosanguineous drainage noted. There is small (1-33%) red granulation within the wound bed. There is a large (67-100%) amount of necrotic tissue within the wound bed including Eschar and Adherent Slough. Wound #4 status is Open. Original cause of wound was Gradually Appeared. The date acquired was: 10/21/2020. The wound has been in treatment 6 weeks. The wound is located on the Right Calcaneus. The wound measures 5.6cm length x 7cm width x 0.1cm depth; 30.788cm^2 area and 3.079cm^3 volume. There is no tunneling or undermining noted. There is a none present amount of drainage noted. There is no granulation within the wound bed. There is a large (67-100%) amount  of necrotic tissue within the wound bed including Eschar. Wound #5 status is Open. Original cause of wound was Gradually Appeared. The date acquired was: 12/22/2020. The wound is located on the Right,Posterior Upper Leg. The wound measures 0.5cm length x 0.5cm width x 0.1cm depth; 0.196cm^2 area and 0.02cm^3 volume. There is Fat Layer (Subcutaneous Tissue) exposed. There is no tunneling or undermining noted. There is a small amount of serosanguineous drainage noted. There is small (1-33%) pink granulation within the wound bed. There is a large (67-100%) amount of necrotic tissue within the wound bed including Adherent Slough. Wound #6 status is Open. Original cause of wound was Gradually Appeared. The date acquired was: 12/22/2020. The wound is located on the Right Gluteus. The wound measures 10cm length x 8cm width x 0.1cm depth; 62.832cm^2 area and 6.283cm^3 volume. There is Fat Layer (Subcutaneous Tissue) exposed. There is no tunneling or undermining noted. There is a medium amount of serosanguineous drainage noted. There is large (67-100%) red, pink granulation within the wound bed. There is a small (1-33%) amount of necrotic tissue within the wound bed including Adherent Slough. Wound #7 status is Open. Original cause of wound was Gradually Appeared. The date acquired was: 12/22/2020. The wound is located on the Left Gluteal fold. The wound measures 4cm length x 3cm width x 0.1cm depth; 9.425cm^2 area and 0.942cm^3 volume. There is Fat Layer (Subcutaneous Tissue) exposed. There is no tunneling or undermining noted. There is a medium amount of serosanguineous drainage noted. There is small (1-33%) red, pink granulation within the wound bed. There is a large (67-100%) amount of necrotic tissue within the wound bed including Eschar and Adherent  Slough. Wound #8 status is Open. Original cause of wound was Gradually Appeared. The date acquired was: 12/22/2020. The wound is located on the Left Gluteus. The  wound measures 4.5cm length x 4cm width x 0.1cm depth; 14.137cm^2 area and 1.414cm^3 volume. There is Fat Layer (Subcutaneous Tissue) exposed. There is no tunneling or undermining noted. There is a medium amount of serosanguineous drainage noted. There is large (67-100%) red, pink granulation within the wound bed. There is a small (1-33%) amount of necrotic tissue within the wound bed including Adherent Slough. 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 There was a Excisional Skin/Subcutaneous Tissue Debridement with a total area of 72 sq cm performed by Tommie Sams., PA-C. With the following instrument(s): Forceps, and Scissors to remove Viable and Non-Viable tissue/material. Material removed includes Fat, Eschar, Subcutaneous Tissue, Slough, Skin: Dermis, and Skin: Epidermis after achieving pain control using Lidocaine 5% topical ointment. No specimens were taken. A time out was conducted at 11:25, prior to the start of the procedure. A Moderate amount of bleeding was controlled with Silver Nitrate. The procedure was tolerated well with a pain level of 0 throughout and a pain level of 0 following the procedure. Post Debridement Measurements: 12cm length x 23cm width x 1.8cm depth; 390.186cm^3 volume. Character of Wound/Ulcer Post Debridement is improved. Post procedure Diagnosis Wound #1: Same as Pre-Procedure Wound #2 Pre-procedure diagnosis of Wound #2 is a Calciphylaxis located on the Left,Medial  Upper Leg . There was a Excisional Skin/Subcutaneous Tissue Debridement with a total area of 208 sq cm performed by Tommie Sams., PA-C. With the following instrument(s): Forceps, and Scissors to remove Viable and Non-Viable tissue/material. Material removed includes Fat, Eschar, Subcutaneous Tissue, Slough, Skin: Dermis, and Skin: Epidermis after achieving pain control using Lidocaine 5% topical ointment. No specimens were taken. A time out was conducted at 11:25, prior to the start of the procedure. A Minimum amount of bleeding was controlled with Pressure. The procedure was tolerated well with a pain level of 0 throughout and a pain level of 0 following the procedure. Post Debridement Measurements: 16cm length x 13cm width x 1.6cm depth; 261.381cm^3 volume. Character of Wound/Ulcer Post Debridement is improved. Post procedure Diagnosis Wound #2: Same as Pre-Procedure Plan Follow-up Appointments: Return Appointment in 3 weeks. Bathing/ Shower/ Hygiene: May shower; gently cleanse wound with antibacterial soap, rinse and pat dry prior to dressing wounds Edema Control - Lymphedema / Segmental Compressive Device / Other: Elevate, Exercise Daily and Avoid Standing for Long Periods of Time. Elevate legs to the level of the heart and pump ankles as often as possible Elevate leg(s) parallel to the floor when sitting. Off-Loading: Hospital bed/mattress Low air-loss mattress (Group 2) Turn and reposition every 2 hours WOUND #1: - Upper Leg Wound Laterality: Right, Medial Cleanser: Soap and Water (Generic) 1 x Per Day/30 Days Discharge Instructions: Gently cleanse wound with antibacterial soap, rinse and pat dry prior to dressing wounds Primary Dressing: Gauze (DME) (Generic) 1 x Per Day/30 Days Discharge Instructions: As directed: dry, moistened with 1/4 strength Dakins Solution Secondary Dressing: ABD Pad 5x9 (in/in) (  DME) (Generic) 1 x Per Day/30 Days Discharge Instructions: Cover with ABD  pad Secondary Dressing: Kerlix 4.5 x 4.1 (in/yd) (DME) (Generic) 1 x Per Day/30 Days Discharge Instructions: Apply Kerlix 4.5 x 4.1 (in/yd) as instructed Secured With: 98M Medipore H Soft Cloth Surgical Tape, 2x2 (in/yd) (DME) (Generic) 1 x Per Day/30 Days WOUND #2: - Upper Leg Wound Laterality: Left, Medial Cleanser: Soap and Water (Generic) 1 x Per Day/30 Days Discharge Instructions: Gently cleanse wound with antibacterial soap, rinse and pat dry prior to dressing wounds Primary Dressing: Gauze (DME) (Generic) 1 x Per Day/30 Days Discharge Instructions: As directed: dry, moistened with 1/4 strength Dakins Solution Secondary Dressing: ABD Pad 5x9 (in/in) (DME) (Generic) 1 x Per Day/30 Days Discharge Instructions: Cover with ABD pad Secondary Dressing: Kerlix 4.5 x 4.1 (in/yd) (DME) (Generic) 1 x Per Day/30 Days Discharge Instructions: Apply Kerlix 4.5 x 4.1 (in/yd) as instructed Secured With: 98M Medipore H Soft Cloth Surgical Tape, 2x2 (in/yd) (DME) (Generic) 1 x Per Day/30 Days WOUND #3: - Upper Leg Wound Laterality: Left, Lateral Cleanser: Soap and Water (Generic) 1 x Per Day/30 Days Discharge Instructions: Gently cleanse wound with antibacterial soap, rinse and pat dry prior to dressing wounds Primary Dressing: Gauze (DME) (Generic) 1 x Per Day/30 Days Discharge Instructions: As directed: dry, moistened with 1/4 strength Dakins Solution Amy Moses, Amy Moses ([id number]) Secondary Dressing: ABD Pad 5x9 (in/in) (DME) (Generic) 1 x Per Day/30 Days Discharge Instructions: Cover with ABD pad Secondary Dressing: Kerlix 4.5 x 4.1 (in/yd) (DME) (Generic) 1 x Per Day/30 Days Discharge Instructions: Apply Kerlix 4.5 x 4.1 (in/yd) as instructed Secured With: 98M Medipore H Soft Cloth Surgical Tape, 2x2 (in/yd) (DME) (Generic) 1 x Per Day/30 Days WOUND #4: - Calcaneus Wound Laterality: Right Cleanser: Soap and Water (Generic) 1 x Per Day/30 Days Discharge Instructions: Gently cleanse wound with  antibacterial soap, rinse and pat dry prior to dressing wounds Topical: Betadine 1 x Per Day/30 Days Discharge Instructions: paint with betadine WOUND #5: - Upper Leg Wound Laterality: Right, Posterior Cleanser: Soap and Water (Generic) 1 x Per Day/30 Days Discharge Instructions: Gently cleanse wound with antibacterial soap, rinse and pat dry prior to dressing wounds Primary Dressing: Silvercel 4 1/4x 4 1/4 (in/in) (DME) (Generic) 1 x Per Day/30 Days Discharge Instructions: Apply Silvercel 4 1/4x 4 1/4 (in/in) as instructed Secondary Dressing: ABD Pad 5x9 (in/in) (DME) (Generic) 1 x Per Day/30 Days Discharge Instructions: Cover with ABD pad Secured With: 98M Medipore H Soft Cloth Surgical Tape, 2x2 (in/yd) (DME) (Generic) 1 x Per Day/30 Days WOUND #6: - Gluteus Wound Laterality: Right Cleanser: Soap and Water (Generic) 1 x Per Day/30 Days Discharge Instructions: Gently cleanse wound with antibacterial soap, rinse and pat dry prior to dressing wounds Primary Dressing: Silvercel 4 1/4x 4 1/4 (in/in) (DME) (Generic) 1 x Per Day/30 Days Discharge Instructions: Apply Silvercel 4 1/4x 4 1/4 (in/in) as instructed Secondary Dressing: ABD Pad 5x9 (in/in) (DME) (Generic) 1 x Per Day/30 Days Discharge Instructions: Cover with ABD pad Secured With: 98M Medipore H Soft Cloth Surgical Tape, 2x2 (in/yd) (DME) (Generic) 1 x Per Day/30 Days WOUND #7: - Gluteal fold Wound Laterality: Left Cleanser: Soap and Water (Generic) 1 x Per Day/30 Days Discharge Instructions: Gently cleanse wound with antibacterial soap, rinse and pat dry prior to dressing wounds Primary Dressing: Silvercel 4 1/4x 4 1/4 (in/in) (DME) (Generic) 1 x Per Day/30 Days Discharge Instructions: Apply Silvercel 4 1/4x 4 1/4 (in/in) as instructed Secondary Dressing: ABD Pad 5x9 (in/in) (DME) (  Generic) 1 x Per Day/30 Days Discharge Instructions: Cover with ABD pad Secured With: 40M Medipore H Soft Cloth Surgical Tape, 2x2 (in/yd) (DME) (Generic)  1 x Per Day/30 Days WOUND #8: - Gluteus Wound Laterality: Left Cleanser: Soap and Water (Generic) 1 x Per Day/30 Days Discharge Instructions: Gently cleanse wound with antibacterial soap, rinse and pat dry prior to dressing wounds Primary Dressing: Silvercel 4 1/4x 4 1/4 (in/in) (DME) (Generic) 1 x Per Day/30 Days Discharge Instructions: Apply Silvercel 4 1/4x 4 1/4 (in/in) as instructed Secondary Dressing: ABD Pad 5x9 (in/in) (DME) (Generic) 1 x Per Day/30 Days Discharge Instructions: Cover with ABD pad Secured With: 40M Medipore H Soft Cloth Surgical Tape, 2x2 (in/yd) (DME) (Generic) 1 x Per Day/30 Days 1. Would recommend currently that we have the patient go ahead and initiate treatment with a continuation of the wound care measures as before. I explained to the patient that unfortunately this is going to be a very long process even getting the eschar off now means that we can get the medicine down deeper into the wound to be packed but unfortunately she still can have a lot of healing to do. This is going to take quite a bit of time. She voiced understanding. With that being said I do think that the patient is likewise going to require a continuation of the Dakin's moistened gauze into these calciphylaxis areas. 2. With regard to the gluteal region I would recommend a silver alginate dressing for these areas followed by ABD pads to cover as I feel like this can be the best way to go. 3. I am also can recommend appropriate offloading and I think the patient would also benefit from a air mattress with a hospital bed and working to work on seeing what we can do to get that for her as well. We will see patient back for reevaluation in 3 weeks here in the clinic. If anything worsens or changes patient will contact our office for additional recommendations. Electronic Signature(s) Signed: 01/19/2021 10:05:52 AM By: Worthy Keeler PA-C Previous Signature: 01/19/2021 8:57:58 AM Version By: Worthy Keeler PA-C Previous Signature: 01/15/2021 1:47:22 PM Version By: Worthy Keeler PA-C Entered By: Worthy Keeler on 01/19/2021 10:05:51 Amy Moses ([id number]) -------------------------------------------------------------------------------- SuperBill Details Patient Name: Amy Moses Date of Service: 01/15/2021 Medical Record Number: [id number] Patient Account Number: [id number] Date of Birth/Sex: 10-Mar-1971 (49 y.o. 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 L89.610 Pressure ulcer of right heel, unstageable E11.42 Type 2 diabetes mellitus with diabetic polyneuropathy L89.313 Pressure ulcer of right buttock, stage 3 L89.323 Pressure ulcer of left buttock, stage 3 Facility Procedures CPT4 Code: [id number] Description: [id number] - DEB SUBQ TISSUE 20 SQ CM/< Modifier: Quantity: 1 CPT4 Code: Description: ICD-10 Diagnosis Description [id number] 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 L97.128 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 Modifier: Quantity: CPT4 Code: [id number] Description: [id number] - DEB SUBQ TISS EA ADDL 20CM Modifier: Quantity: 13 CPT4 Code: Description: ICD-10 Diagnosis Description [id number] 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 L97.128 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 Modifier: Quantity: Physician Procedures CPT4 [id number] Description: [id number] -  WC PHYS SUBQ TISS 20 SQ CM Modifier: Quantity: 1 CPT4 Code: Description: ICD-10 Diagnosis Description  [id number] 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 L97.128 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 Modifier: Quantity: CPT4 Code: [id number] Description: [id number] - WC PHYS SUBQ TISS EA ADDL 20 CM Modifier: Quantity: 13 CPT4 Code: Description: ICD-10 Diagnosis Description [id number] 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

## 2021-01-18 NOTE — Progress Notes (Addendum)
LAUNI, TOMASKO ([id number]) Visit Report for 01/15/2021 Arrival Information Details Patient Name: Amy Moses, Amy Moses Date of Service: 01/15/2021 10:15 AM Medical Record Number: [id number] Patient Account Number: [id number] Date of Birth/Sex: August 01, 1971 (50 y.o. F) Treating RN: Carlene Coria Primary Care Provider: Zenon Mayo Other Clinician: Referring Provider: Zenon Mayo Treating Provider/Extender: Skipper Cliche in Treatment: 6 Visit Information History Since Last Visit All ordered tests and consults were completed: No Patient Arrived: Ambulatory Added or deleted any medications: No Arrival Time: 10:36 Any new allergies or adverse reactions: No Accompanied By: friend Had a fall or experienced change in No Transfer Assistance: None activities of daily living that may affect Patient Identification Verified: Yes risk of falls: Secondary Verification Process Completed: Yes Signs or symptoms of abuse/neglect since last visito No Patient Requires Transmission-Based Precautions: No Hospitalized since last visit: No Patient Has Alerts: No Implantable device outside of the clinic excluding No cellular tissue based products placed in the center since last visit: Pain Present Now: Yes Electronic Signature(s) Signed: 01/19/2021 4:52:45 PM By: Carlene Coria RN Entered By: Carlene Coria on 01/15/2021 10:38:50 Amy Moses ([id number]) -------------------------------------------------------------------------------- Clinic Level of Care Assessment Details Patient Name: Amy Moses Date of Service: 01/15/2021 10:15 AM Medical Record Number: [id number] Patient Account Number: [id number] Date of Birth/Sex: October 24, 1971 (49 y.o. F) Treating RN: Carlene Coria Primary Care Provider: Zenon Mayo Other Clinician: Referring Provider: Zenon Mayo Treating Provider/Extender: Skipper Cliche in Treatment: 6 Clinic Level of Care Assessment Items TOOL 1 Quantity Score '[]'$  - Use when  EandM and Procedure is performed on INITIAL visit 0 ASSESSMENTS - Nursing Assessment / Reassessment '[]'$  - General Physical Exam (combine w/ comprehensive assessment (listed just below) when performed on new 0 pt. evals) '[]'$  - 0 Comprehensive Assessment (HX, ROS, Risk Assessments, Wounds Hx, etc.) ASSESSMENTS - Wound and Skin Assessment / Reassessment '[]'$  - Dermatologic / Skin Assessment (not related to wound area) 0 ASSESSMENTS - Ostomy and/or Continence Assessment and Care '[]'$  - Incontinence Assessment and Management 0 '[]'$  - 0 Ostomy Care Assessment and Management (repouching, etc.) PROCESS - Coordination of Care '[]'$  - Simple Patient / Family Education for ongoing care 0 '[]'$  - 0 Complex (extensive) Patient / Family Education for ongoing care '[]'$  - 0 Staff obtains Programmer, systems, Records, Test Results / Process Orders '[]'$  - 0 Staff telephones HHA, Nursing Homes / Clarify orders / etc '[]'$  - 0 Routine Transfer to another Facility (non-emergent condition) '[]'$  - 0 Routine Hospital Admission (non-emergent condition) '[]'$  - 0 New Admissions / Biomedical engineer / Ordering NPWT, Apligraf, etc. '[]'$  - 0 Emergency Hospital Admission (emergent condition) PROCESS - Special Needs '[]'$  - Pediatric / Minor Patient Management 0 '[]'$  - 0 Isolation Patient Management '[]'$  - 0 Hearing / Language / Visual special needs '[]'$  - 0 Assessment of Community assistance (transportation, D/C planning, etc.) '[]'$  - 0 Additional assistance / Altered mentation '[]'$  - 0 Support Surface(s) Assessment (bed, cushion, seat, etc.) INTERVENTIONS - Miscellaneous '[]'$  - External ear exam 0 '[]'$  - 0 Patient Transfer (multiple staff / Civil Service fast streamer / Similar devices) '[]'$  - 0 Simple Staple / Suture removal (25 or less) '[]'$  - 0 Complex Staple / Suture removal (26 or more) '[]'$  - 0 Hypo/Hyperglycemic Management (do not check if billed separately) '[]'$  - 0 Ankle / Brachial Index (ABI) - do not check if billed separately Has the patient been seen at  the hospital within the last three years: Yes Total Score: 0 Level Of Care: ____ Amy Moses ([id number]) Electronic Signature(s) Signed: 01/19/2021 4:52:45  PM By: Carlene Coria RN Entered By: Carlene Coria on 01/15/2021 12:51:56 Amy Moses ([id number]) -------------------------------------------------------------------------------- Lower Extremity Assessment Details Patient Name: Amy Moses Date of Service: 01/15/2021 10:15 AM Medical Record Number: [id number] Patient Account Number: [id number] Date of Birth/Sex: [birth year] (49 y.o. F) Treating RN: Carlene Coria Primary Care Provider: Zenon Mayo Other Clinician: Referring Provider: Zenon Mayo Treating Provider/Extender: Skipper Cliche in Treatment: 6 Electronic Signature(s) Signed: 01/19/2021 4:52:45 PM By: Carlene Coria RN Entered By: Carlene Coria on 01/15/2021 11:12:58 Amy Moses ([id number]) -------------------------------------------------------------------------------- Multi Wound Chart Details Patient Name: Amy Moses Date of Service: 01/15/2021 10:15 AM Medical Record Number: [id number] Patient Account Number: [id number] Date of Birth/Sex: 15-Jun-1971 (49 y.o. F) Treating RN: Carlene Coria Primary Care Provider: Zenon Mayo Other Clinician: Referring Provider: Zenon Mayo Treating Provider/Extender: Skipper Cliche in Treatment: 6 Vital Signs Height(in): 66 Pulse(bpm): 72 Weight(lbs): 235 Blood Pressure(mmHg): 163/93 Body Mass Index(BMI): 38 Temperature(F): 97.9 Respiratory Rate(breaths/min): 18 Photos: Wound Location: Right, Medial Upper Leg Left, Medial Upper Leg Left, Lateral Upper Leg Wounding Event: Gradually Appeared Gradually Appeared Gradually Appeared Primary Etiology: Calciphylaxis Calciphylaxis Calciphylaxis Comorbid History: Cataracts, Glaucoma, Anemia, Cataracts, Glaucoma, Anemia, Cataracts, Glaucoma, Anemia, Hypertension, Type II Diabetes, End Hypertension, Type II  Diabetes, End Hypertension, Type II Diabetes, End Stage Renal Disease, Neuropathy Stage Renal Disease, Neuropathy Stage Renal Disease, Neuropathy Date Acquired: 07/22/2020 07/22/2020 07/22/2020 Weeks of Treatment: '6 6 6 '$ Wound Status: Open Open Open Clustered Wound: No No No Measurements L x W x D (cm) 12x23x1.8 16x13x1.6 15x5x0.2 Area (cm) : 216.77 163.363 58.905 Volume (cm) : 390.186 261.381 11.781 % Reduction in Area: 3.50% -33.30% -134.40% % Reduction in Volume: -1637.10% -2033.40% -368.80% Classification: Full Thickness Without Exposed Full Thickness Without Exposed Full Thickness Without Exposed Support Structures Support Structures Support Structures Exudate Amount: Medium Medium Medium Exudate Type: Serosanguineous Serosanguineous Serosanguineous Exudate Color: red, brown red, brown red, brown Granulation Amount: Small (1-33%) Small (1-33%) Small (1-33%) Granulation Quality: Red Red Red Necrotic Amount: Large (67-100%) Large (67-100%) Large (67-100%) Necrotic Tissue: Eschar, Adherent Slough Eschar, Adherent [place] Exposed Structures: Fat Layer (Subcutaneous Tissue): Fat Layer (Subcutaneous Tissue): Fat Layer (Subcutaneous Tissue): Yes Yes Yes Fascia: No Fascia: No Fascia: No Tendon: No Tendon: No Tendon: No Muscle: No Muscle: No Muscle: No Joint: No Joint: No Joint: No Bone: No Bone: No Bone: No Epithelialization: None None None Wound Number: '4 5 6 '$ PhotosTHAILA, Amy Moses ([id number]) Wound Location: Right Calcaneus Right, Posterior Upper Leg Right Gluteus Wounding Event: Gradually Appeared Gradually Appeared Gradually Appeared Primary Etiology: Pressure Ulcer Pressure Ulcer Pressure Ulcer Comorbid History: Cataracts, Glaucoma, Anemia, Cataracts, Glaucoma, Anemia, Cataracts, Glaucoma, Anemia, Hypertension, Type II Diabetes, End Hypertension, Type II Diabetes, End Hypertension, Type II Diabetes, End Stage Renal Disease, Neuropathy Stage Renal  Disease, Neuropathy Stage Renal Disease, Neuropathy Date Acquired: 10/21/2020 12/22/2020 12/22/2020 Weeks of Treatment: 6 0 0 Wound Status: Open Open Open Clustered Wound: No No Yes Measurements L x W x D (cm) 5.6x7x0.1 0.5x0.5x0.1 10x8x0.1 Area (cm) : 30.788 0.196 62.832 Volume (cm) : 3.079 0.02 6.283 % Reduction in Area: -8.90% 0.00% 0.00% % Reduction in Volume: -8.90% 0.00% 0.00% Classification: Unstageable/Unclassified Category/Stage III Category/Stage III Exudate Amount: None Present Small Medium Exudate Type: N/A Serosanguineous Serosanguineous Exudate Color: N/A red, brown red, brown Granulation Amount: None Present (0%) Small (1-33%) Large (67-100%) Granulation Quality: N/A Pink Red, Pink Necrotic Amount: Large (67-100%) Large (67-100%) Small (1-33%) Necrotic Tissue: [place] Exposed Structures: Fascia: No Fat Layer (Subcutaneous Tissue): Fat Layer (  Subcutaneous Tissue): Fat Layer (Subcutaneous Tissue): Yes Yes No Fascia: No Fascia: No Tendon: No Tendon: No Tendon: No Muscle: No Muscle: No Muscle: No Joint: No Joint: No Joint: No Bone: No Bone: No Bone: No Epithelialization: None None Medium (34-66%) Wound Number: 7 8 N/A Photos: N/A Wound Location: Left Gluteal fold Left Gluteus N/A Wounding Event: Gradually Appeared Gradually Appeared N/A Primary Etiology: Pressure Ulcer Pressure Ulcer N/A Comorbid History: Cataracts, Glaucoma, Anemia, Cataracts, Glaucoma, Anemia, N/A Hypertension, Type II Diabetes, End Hypertension, Type II Diabetes, End Stage Renal Disease, Neuropathy Stage Renal Disease, Neuropathy Date Acquired: 12/22/2020 12/22/2020 N/A Weeks of Treatment: 0 0 N/A Wound Status: Open Open N/A Clustered Wound: No No N/A Measurements L x W x D (cm) 4x3x0.1 4.5x4x0.1 N/A Area (cm) : 9.425 14.137 N/A Volume (cm) : 0.942 1.414 N/A % Reduction in Area: 0.00% 0.00% N/A % Reduction in Volume: 0.00% 0.00% N/A Classification:  Category/Stage III Category/Stage III N/A Exudate Amount: Medium Medium N/A Exudate Type: Serosanguineous Serosanguineous N/A Exudate Color: red, brown red, brown N/A Granulation Amount: Small (1-33%) Large (67-100%) N/A Granulation Quality: Red, Pink Red, Pink N/A Necrotic Amount: Large (67-100%) Small (1-33%) N/A Necrotic Tissue: Eschar, Adherent [place] N/A Exposed Structures: Fat Layer (Subcutaneous Tissue): Fat Layer (Subcutaneous Tissue): N/A Yes Yes Fascia: No Fascia: No Tendon: No Tendon: No Muscle: No Muscle: No Amy Moses, Amy Moses ([id number]) Joint: No Joint: No Bone: No Bone: No Epithelialization: None None N/A Treatment Notes Electronic Signature(s) Signed: 01/19/2021 4:52:45 PM By: Carlene Coria RN Entered By: Carlene Coria on 01/15/2021 12:37:32 Amy Moses ([id number]) -------------------------------------------------------------------------------- [place] Details Patient Name: Amy Moses Date of Service: 01/15/2021 10:15 AM Medical Record Number: [id number] Patient Account Number: [id number] Date of Birth/Sex: 1971/11/14 (49 y.o. F) Treating RN: Carlene Coria Primary Care Provider: Zenon Mayo Other Clinician: Referring Provider: Zenon Mayo Treating Provider/Extender: Skipper Cliche in Treatment: 6 Active Inactive Wound/Skin Impairment Nursing Diagnoses: Impaired tissue integrity Goals: Patient/caregiver will verbalize understanding of skin care regimen Date Initiated: 12/02/2020 Target Resolution Date: 01/30/2021 Goal Status: Active Ulcer/skin breakdown will have a volume reduction of 30% by week 4 Date Initiated: 12/02/2020 Date Inactivated: 01/15/2021 Target Resolution Date: 01/02/2021 Goal Status: Unmet Unmet Reason: comorbities Ulcer/skin breakdown will have a volume reduction of 50% by week 8 Date Initiated: 01/15/2021 Target Resolution Date: 01/30/2021 Goal Status: Active Interventions: Assess  ulceration(s) every visit Treatment Activities: Referred to DME provider for dressing supplies : 12/02/2020 Skin care regimen initiated : 12/02/2020 Topical wound management initiated : 12/02/2020 Notes: Electronic Signature(s) Signed: 01/19/2021 4:52:45 PM By: Carlene Coria RN Entered By: Carlene Coria on 01/15/2021 12:37:20 Amy Moses ([id number]) -------------------------------------------------------------------------------- Pain Assessment Details Patient Name: Amy Moses Date of Service: 01/15/2021 10:15 AM Medical Record Number: [id number] Patient Account Number: [id number] Date of Birth/Sex: 1970-12-17 (50 y.o. F) Treating RN: Carlene Coria Primary Care Provider: Zenon Mayo Other Clinician: Referring Provider: Zenon Mayo Treating Provider/Extender: Skipper Cliche in Treatment: 6 Active Problems Location of Pain Severity and Description of Pain Patient Has Paino Yes Site Locations Duration of the Pain. Constant / Intermittento Intermittent How Long Does it Lasto Hours: Minutes: 20 Rate the pain. Current Pain Level: 0 Worst Pain Level: 5 Least Pain Level: 0 Tolerable Pain Level: 5 Character of Pain Describe the Pain: Burning Pain Management and Medication Current Pain Management: Medication: Yes Cold Application: No Rest: Yes Massage: No Activity: No T.E.N.S.: No Heat Application: No Leg drop or elevation: No Is the Current Pain Management Adequate: Inadequate How does your wound  impact your activities of daily livingo Sleep: No Bathing: No Appetite: No Relationship With Others: No Bladder Continence: No Emotions: No Bowel Continence: No Work: No Toileting: No Drive: No Dressing: No Hobbies: No Electronic Signature(s) Signed: 01/19/2021 4:52:45 PM By: Carlene Coria RN Entered By: Carlene Coria on 01/15/2021 10:40:41 Amy Moses  ([id number]) -------------------------------------------------------------------------------- Patient/Caregiver Education Details Patient Name: Amy Moses Date of Service: 01/15/2021 10:15 AM Medical Record Number: [id number] Patient Account Number: [id number] Date of Birth/Gender: 11/27/1970 (50 y.o. F) Treating RN: Carlene Coria Primary Care Physician: Zenon Mayo Other Clinician: Referring Physician: Zenon Mayo Treating Physician/Extender: Skipper Cliche in Treatment: 6 Education Assessment Education Provided To: Patient Education Topics Provided Wound/Skin Impairment: Methods: Explain/Verbal Responses: State content correctly Electronic Signature(s) Signed: 01/19/2021 4:52:45 PM By: Carlene Coria RN Entered By: Carlene Coria on 01/15/2021 12:52:12 Amy Moses ([id number]) -------------------------------------------------------------------------------- Wound Assessment Details Patient Name: Amy Moses Date of Service: 01/15/2021 10:15 AM Medical Record Number: [id number] Patient Account Number: [id number] Date of Birth/Sex: 11/20/1971 (49 y.o. F) Treating RN: Carlene Coria Primary Care Provider: Zenon Mayo Other Clinician: Referring Provider: Zenon Mayo Treating Provider/Extender: Skipper Cliche in Treatment: 6 Wound Status Wound Number: 1 Primary Calciphylaxis Etiology: Wound Location: Right, Medial Upper Leg Wound Open Wounding Event: Gradually Appeared Status: Date Acquired: 07/22/2020 Comorbid Cataracts, Glaucoma, Anemia, Hypertension, Type II Weeks Of Treatment: 6 History: Diabetes, End Stage Renal Disease, Neuropathy Clustered Wound: No Photos Wound Measurements Length: (cm) 12 Width: (cm) 23 Depth: (cm) 1.8 Area: (cm) 216.77 Volume: (cm) 390.186 % Reduction in Area: 3.5% % Reduction in Volume: -1637.1% Epithelialization: None Tunneling: No Undermining: No Wound Description Classification: Full Thickness Without Exposed  Support Structu Exudate Amount: Medium Exudate Type: Serosanguineous Exudate Color: red, brown res Foul Odor After Cleansing: No Slough/Fibrino Yes Wound Bed Granulation Amount: Small (1-33%) Exposed Structure Granulation Quality: Red Fascia Exposed: No Necrotic Amount: Large (67-100%) Fat Layer (Subcutaneous Tissue) Exposed: Yes Necrotic Quality: Eschar, Adherent Slough Tendon Exposed: No Muscle Exposed: No Joint Exposed: No Bone Exposed: No Electronic Signature(s) Signed: 01/19/2021 4:52:45 PM By: Carlene Coria RN Entered By: Carlene Coria on 01/15/2021 11:10:31 Amy Moses ([id number]) -------------------------------------------------------------------------------- Wound Assessment Details Patient Name: Amy Moses Date of Service: 01/15/2021 10:15 AM Medical Record Number: [id number] Patient Account Number: [id number] Date of Birth/Sex: 1971/03/19 (50 y.o. F) Treating RN: Carlene Coria Primary Care Provider: Zenon Mayo Other Clinician: Referring Provider: Zenon Mayo Treating Provider/Extender: Skipper Cliche in Treatment: 6 Wound Status Wound Number: 2 Primary Calciphylaxis Etiology: Wound Location: Left, Medial Upper Leg Wound Open Wounding Event: Gradually Appeared Status: Date Acquired: 07/22/2020 Comorbid Cataracts, Glaucoma, Anemia, Hypertension, Type II Weeks Of Treatment: 6 History: Diabetes, End Stage Renal Disease, Neuropathy Clustered Wound: No Photos Wound Measurements Length: (cm) 16 Width: (cm) 13 Depth: (cm) 1.6 Area: (cm) 163.363 Volume: (cm) 261.381 % Reduction in Area: -33.3% % Reduction in Volume: -2033.4% Epithelialization: None Tunneling: No Undermining: No Wound Description Classification: Full Thickness Without Exposed Support Structu Exudate Amount: Medium Exudate Type: Serosanguineous Exudate Color: red, brown res Foul Odor After Cleansing: No Slough/Fibrino Yes Wound Bed Granulation Amount: Small (1-33%) Exposed  Structure Granulation Quality: Red Fascia Exposed: No Necrotic Amount: Large (67-100%) Fat Layer (Subcutaneous Tissue) Exposed: Yes Necrotic Quality: Eschar, Adherent Slough Tendon Exposed: No Muscle Exposed: No Joint Exposed: No Bone Exposed: No Electronic Signature(s) Signed: 01/19/2021 4:52:45 PM By: Carlene Coria RN Entered By: Carlene Coria on 01/15/2021 11:10:59 Amy Moses ([id number]) -------------------------------------------------------------------------------- Wound Assessment Details Patient Name: Amy Moses Date of Service: 01/15/2021 10:15 AM Medical Record  Number: [id number] Patient Account Number: [id number] Date of Birth/Sex: December 27, 1970 (49 y.o. F) Treating RN: Carlene Coria Primary Care Provider: Zenon Mayo Other Clinician: Referring Provider: Zenon Mayo Treating Provider/Extender: Skipper Cliche in Treatment: 6 Wound Status Wound Number: 3 Primary Calciphylaxis Etiology: Wound Location: Left, Lateral Upper Leg Wound Open Wounding Event: Gradually Appeared Status: Date Acquired: 07/22/2020 Comorbid Cataracts, Glaucoma, Anemia, Hypertension, Type II Weeks Of Treatment: 6 History: Diabetes, End Stage Renal Disease, Neuropathy Clustered Wound: No Photos Wound Measurements Length: (cm) 15 Width: (cm) 5 Depth: (cm) 0.2 Area: (cm) 58.905 Volume: (cm) 11.781 % Reduction in Area: -134.4% % Reduction in Volume: -368.8% Epithelialization: None Tunneling: No Undermining: No Wound Description Classification: Full Thickness Without Exposed Support Structu Exudate Amount: Medium Exudate Type: Serosanguineous Exudate Color: red, brown res Foul Odor After Cleansing: No Slough/Fibrino Yes Wound Bed Granulation Amount: Small (1-33%) Exposed Structure Granulation Quality: Red Fascia Exposed: No Necrotic Amount: Large (67-100%) Fat Layer (Subcutaneous Tissue) Exposed: Yes Necrotic Quality: Eschar, Adherent Slough Tendon Exposed: No Muscle  Exposed: No Joint Exposed: No Bone Exposed: No Electronic Signature(s) Signed: 01/19/2021 4:52:45 PM By: Carlene Coria RN Entered By: Carlene Coria on 01/15/2021 11:12:07 Amy Moses ([id number]) -------------------------------------------------------------------------------- Wound Assessment Details Patient Name: Amy Moses Date of Service: 01/15/2021 10:15 AM Medical Record Number: [id number] Patient Account Number: [id number] Date of Birth/Sex: [birth year] (50 y.o. F) Treating RN: Carlene Coria Primary Care Provider: Zenon Mayo Other Clinician: Referring Provider: Zenon Mayo Treating Provider/Extender: Skipper Cliche in Treatment: 6 Wound Status Wound Number: 4 Primary Pressure Ulcer Etiology: Wound Location: Right Calcaneus Wound Open Wounding Event: Gradually Appeared Status: Date Acquired: 10/21/2020 Comorbid Cataracts, Glaucoma, Anemia, Hypertension, Type II Weeks Of Treatment: 6 History: Diabetes, End Stage Renal Disease, Neuropathy Clustered Wound: No Photos Wound Measurements Length: (cm) 5.6 Width: (cm) 7 Depth: (cm) 0.1 Area: (cm) 30.788 Volume: (cm) 3.079 % Reduction in Area: -8.9% % Reduction in Volume: -8.9% Epithelialization: None Tunneling: No Undermining: No Wound Description Classification: Unstageable/Unclassified Exudate Amount: None Present Foul Odor After Cleansing: No Slough/Fibrino No Wound Bed Granulation Amount: None Present (0%) Exposed Structure Necrotic Amount: Large (67-100%) Fascia Exposed: No Necrotic Quality: Eschar Fat Layer (Subcutaneous Tissue) Exposed: No Tendon Exposed: No Muscle Exposed: No Joint Exposed: No Bone Exposed: No Electronic Signature(s) Signed: 01/19/2021 4:52:45 PM By: Carlene Coria RN Entered By: Carlene Coria on 01/15/2021 11:12:39 Amy Moses ([id number]) -------------------------------------------------------------------------------- Wound Assessment Details Patient Name: Amy Moses Date of Service: 01/15/2021 10:15 AM Medical Record Number: [id number] Patient Account Number: [id number] Date of Birth/Sex: 07/27/1971 (50 y.o. F) Treating RN: Carlene Coria Primary Care Provider: Zenon Mayo Other Clinician: Referring Provider: Zenon Mayo Treating Provider/Extender: Skipper Cliche in Treatment: 6 Wound Status Wound Number: 5 Primary Pressure Ulcer Etiology: Wound Location: Right, Posterior Upper Leg Wound Open Wounding Event: Gradually Appeared Status: Date Acquired: 12/22/2020 Comorbid Cataracts, Glaucoma, Anemia, Hypertension, Type II Weeks Of Treatment: 0 History: Diabetes, End Stage Renal Disease, Neuropathy Clustered Wound: No Photos Wound Measurements Length: (cm) 0.5 Width: (cm) 0.5 Depth: (cm) 0.1 Area: (cm) 0.196 Volume: (cm) 0.02 % Reduction in Area: 0% % Reduction in Volume: 0% Epithelialization: None Tunneling: No Undermining: No Wound Description Classification: Category/Stage III Exudate Amount: Small Exudate Type: Serosanguineous Exudate Color: red, brown Foul Odor After Cleansing: No Slough/Fibrino Yes Wound Bed Granulation Amount: Small (1-33%) Exposed Structure Granulation Quality: Pink Fascia Exposed: No Necrotic Amount: Large (67-100%) Fat Layer (Subcutaneous Tissue) Exposed: Yes Necrotic Quality: Adherent Slough Tendon Exposed: No Muscle Exposed: No Joint Exposed: No Bone Exposed:  No Electronic Signature(s) Signed: 01/19/2021 4:52:45 PM By: Carlene Coria RN Entered By: Carlene Coria on 01/15/2021 11:49:16 Amy Moses ([id number]) -------------------------------------------------------------------------------- Wound Assessment Details Patient Name: Amy Moses Date of Service: 01/15/2021 10:15 AM Medical Record Number: [id number] Patient Account Number: [id number] Date of Birth/Sex: 16-May-1971 (49 y.o. F) Treating RN: Carlene Coria Primary Care Provider: Zenon Mayo Other Clinician: Referring  Provider: Zenon Mayo Treating Provider/Extender: Skipper Cliche in Treatment: 6 Wound Status Wound Number: 6 Primary Pressure Ulcer Etiology: Wound Location: Right Gluteus Wound Open Wounding Event: Gradually Appeared Status: Date Acquired: 12/22/2020 Comorbid Cataracts, Glaucoma, Anemia, Hypertension, Type II Weeks Of Treatment: 0 History: Diabetes, End Stage Renal Disease, Neuropathy Clustered Wound: Yes Photos Wound Measurements Length: (cm) 10 Width: (cm) 8 Depth: (cm) 0.1 Area: (cm) 62.832 Volume: (cm) 6.283 % Reduction in Area: 0% % Reduction in Volume: 0% Epithelialization: Medium (34-66%) Tunneling: No Undermining: No Wound Description Classification: Category/Stage III Exudate Amount: Medium Exudate Type: Serosanguineous Exudate Color: red, brown Foul Odor After Cleansing: No Slough/Fibrino Yes Wound Bed Granulation Amount: Large (67-100%) Exposed Structure Granulation Quality: Red, Pink Fascia Exposed: No Necrotic Amount: Small (1-33%) Fat Layer (Subcutaneous Tissue) Exposed: Yes Necrotic Quality: Adherent Slough Tendon Exposed: No Muscle Exposed: No Joint Exposed: No Bone Exposed: No Electronic Signature(s) Signed: 01/19/2021 4:52:45 PM By: Carlene Coria RN Entered By: Carlene Coria on 01/15/2021 11:51:56 Amy Moses ([id number]) -------------------------------------------------------------------------------- Wound Assessment Details Patient Name: Amy Moses Date of Service: 01/15/2021 10:15 AM Medical Record Number: [id number] Patient Account Number: [id number] Date of Birth/Sex: 04-02-1971 (50 y.o. F) Treating RN: Carlene Coria Primary Care Provider: Zenon Mayo Other Clinician: Referring Provider: Zenon Mayo Treating Provider/Extender: Skipper Cliche in Treatment: 6 Wound Status Wound Number: 7 Primary Pressure Ulcer Etiology: Wound Location: Left Gluteal fold Wound Open Wounding Event: Gradually  Appeared Status: Date Acquired: 12/22/2020 Comorbid Cataracts, Glaucoma, Anemia, Hypertension, Type II Weeks Of Treatment: 0 History: Diabetes, End Stage Renal Disease, Neuropathy Clustered Wound: No Photos Wound Measurements Length: (cm) 4 Width: (cm) 3 Depth: (cm) 0.1 Area: (cm) 9.425 Volume: (cm) 0.942 % Reduction in Area: 0% % Reduction in Volume: 0% Epithelialization: None Tunneling: No Undermining: No Wound Description Classification: Category/Stage III Exudate Amount: Medium Exudate Type: Serosanguineous Exudate Color: red, brown Foul Odor After Cleansing: No Slough/Fibrino Yes Wound Bed Granulation Amount: Small (1-33%) Exposed Structure Granulation Quality: Red, Pink Fascia Exposed: No Necrotic Amount: Large (67-100%) Fat Layer (Subcutaneous Tissue) Exposed: Yes Necrotic Quality: Eschar, Adherent Slough Tendon Exposed: No Muscle Exposed: No Joint Exposed: No Bone Exposed: No Electronic Signature(s) Signed: 01/19/2021 4:52:45 PM By: Carlene Coria RN Entered By: Carlene Coria on 01/15/2021 11:52:48 Amy Moses ([id number]) -------------------------------------------------------------------------------- Wound Assessment Details Patient Name: Amy Moses Date of Service: 01/15/2021 10:15 AM Medical Record Number: [id number] Patient Account Number: [id number] Date of Birth/Sex: 1971/02/24 (50 y.o. F) Treating RN: Carlene Coria Primary Care Provider: Zenon Mayo Other Clinician: Referring Provider: Zenon Mayo Treating Provider/Extender: Skipper Cliche in Treatment: 6 Wound Status Wound Number: 8 Primary Pressure Ulcer Etiology: Wound Location: Left Gluteus Wound Open Wounding Event: Gradually Appeared Status: Date Acquired: 12/22/2020 Comorbid Cataracts, Glaucoma, Anemia, Hypertension, Type II Weeks Of Treatment: 0 History: Diabetes, End Stage Renal Disease, Neuropathy Clustered Wound: No Photos Wound Measurements Length: (cm) 4.5 Width:  (cm) 4 Depth: (cm) 0.1 Area: (cm) 14.137 Volume: (cm) 1.414 % Reduction in Area: 0% % Reduction in Volume: 0% Epithelialization: None Tunneling: No Undermining: No Wound Description Classification: Category/Stage III Exudate Amount: Medium Exudate Type: Serosanguineous Exudate Color: red, brown Foul Odor  After Cleansing: No Slough/Fibrino Yes Wound Bed Granulation Amount: Large (67-100%) Exposed Structure Granulation Quality: Red, Pink Fascia Exposed: No Necrotic Amount: Small (1-33%) Fat Layer (Subcutaneous Tissue) Exposed: Yes Necrotic Quality: Adherent Slough Tendon Exposed: No Muscle Exposed: No Joint Exposed: No Bone Exposed: No Electronic Signature(s) Signed: 01/19/2021 4:52:45 PM By: Carlene Coria RN Entered By: Carlene Coria on 01/15/2021 11:43:18 Amy Moses ([id number]) -------------------------------------------------------------------------------- Vitals Details Patient Name: Amy Moses Date of Service: 01/15/2021 10:15 AM Medical Record Number: [id number] Patient Account Number: [id number] Date of Birth/Sex: 12/02/1970 (50 y.o. F) Treating RN: Carlene Coria Primary Care Provider: Zenon Mayo Other Clinician: Referring Provider: Zenon Mayo Treating Provider/Extender: Skipper Cliche in Treatment: 6 Vital Signs Time Taken: 10:38 Temperature (F): 97.9 Height (in): 66 Pulse (bpm): 67 Weight (lbs): 235 Respiratory Rate (breaths/min): 18 Body Mass Index (BMI): 37.9 Blood Pressure (mmHg): 163/93 Reference Range: 80 - 120 mg / dl Electronic Signature(s) Signed: 01/19/2021 4:52:45 PM By: Carlene Coria RN Entered By: Carlene Coria on 01/15/2021 10:39:48

## 2021-01-27 ENCOUNTER — Other Ambulatory Visit (HOSPITAL_COMMUNITY): Payer: Self-pay | Admitting: Interventional Radiology

## 2021-01-27 ENCOUNTER — Other Ambulatory Visit (HOSPITAL_COMMUNITY): Payer: Self-pay | Admitting: Nephrology

## 2021-01-27 DIAGNOSIS — N186 End stage renal disease: Secondary | ICD-10-CM

## 2021-02-05 ENCOUNTER — Inpatient Hospital Stay (HOSPITAL_COMMUNITY): Admission: RE | Admit: 2021-02-05 | Payer: Medicaid - Out of State | Source: Ambulatory Visit

## 2021-02-05 ENCOUNTER — Ambulatory Visit: Payer: Medicaid - Out of State | Admitting: Physician Assistant

## 2021-02-16 ENCOUNTER — Other Ambulatory Visit: Payer: Self-pay

## 2021-02-16 ENCOUNTER — Encounter: Payer: Medicaid - Out of State | Attending: Physician Assistant | Admitting: Physician Assistant

## 2021-02-16 DIAGNOSIS — E1142 Type 2 diabetes mellitus with diabetic polyneuropathy: Secondary | ICD-10-CM | POA: Insufficient documentation

## 2021-02-16 DIAGNOSIS — I120 Hypertensive chronic kidney disease with stage 5 chronic kidney disease or end stage renal disease: Secondary | ICD-10-CM | POA: Insufficient documentation

## 2021-02-16 DIAGNOSIS — L89323 Pressure ulcer of left buttock, stage 3: Secondary | ICD-10-CM | POA: Diagnosis not present

## 2021-02-16 DIAGNOSIS — L89610 Pressure ulcer of right heel, unstageable: Secondary | ICD-10-CM | POA: Insufficient documentation

## 2021-02-16 DIAGNOSIS — L97128 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: Secondary | ICD-10-CM | POA: Diagnosis not present

## 2021-02-16 DIAGNOSIS — E1122 Type 2 diabetes mellitus with diabetic chronic kidney disease: Secondary | ICD-10-CM | POA: Diagnosis not present

## 2021-02-16 DIAGNOSIS — Z794 Long term (current) use of insulin: Secondary | ICD-10-CM | POA: Insufficient documentation

## 2021-02-16 DIAGNOSIS — L97118 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: Secondary | ICD-10-CM | POA: Insufficient documentation

## 2021-02-16 DIAGNOSIS — Z992 Dependence on renal dialysis: Secondary | ICD-10-CM | POA: Diagnosis not present

## 2021-02-16 DIAGNOSIS — N186 End stage renal disease: Secondary | ICD-10-CM | POA: Insufficient documentation

## 2021-02-16 DIAGNOSIS — L89313 Pressure ulcer of right buttock, stage 3: Secondary | ICD-10-CM | POA: Insufficient documentation

## 2021-02-16 NOTE — Progress Notes (Addendum)
Amy Moses ([id number]) Visit Report for 02/16/2021 Chief Complaint Document Details Patient Name: Amy Moses Date of Service: 02/16/2021 10:45 AM Medical Record Number: [id number] Patient Account Number: [id number] Date of Birth/Sex: 1971/09/28 (50 y.o. F) Treating RN: Dolan Amen Primary Care Provider: Zenon Mayo Other Clinician: Jeanine Luz Referring Provider: Zenon Mayo Treating Provider/Extender: Skipper Cliche in Treatment: 10 Information Obtained from: Patient Chief Complaint 12/02/2020; patient is here for review of extensive wounds on her bilateral thighs as well as an area on her right plantar heel Electronic Signature(s) Signed: 02/16/2021 11:45:46 AM By: Worthy Keeler PA-C Entered By: Worthy Keeler on 02/16/2021 11:45:45 Amy Moses ([id number]) -------------------------------------------------------------------------------- Debridement Details Patient Name: Amy Moses Date of Service: 02/16/2021 10:45 AM Medical Record Number: [id number] Patient Account Number: [id number] Date of Birth/Sex: [birth year] (50 y.o. F) Treating RN: Dolan Amen Primary Care Provider: Zenon Mayo Other Clinician: Jeanine Luz Referring Provider: Zenon Mayo Treating Provider/Extender: Skipper Cliche in Treatment: 10 Debridement Performed for Wound #4 Right Calcaneus Assessment: Performed By: Physician Tommie Sams., PA-C Debridement Type: Debridement Level of Consciousness (Pre- Awake and Alert procedure): Pre-procedure Verification/Time Out Yes - 12:00 Taken: Total Area Debrided (L x W): 2 (cm) x 1.5 (cm) = 3 (cm) Tissue and other material Viable, Non-Viable, Eschar, Subcutaneous debrided: Level: Skin/Subcutaneous Tissue Debridement Description: Excisional Instrument: Curette Bleeding: Minimum Hemostasis Achieved: Pressure Response to Treatment: Procedure was tolerated well Level of Consciousness (Post- Awake and  Alert procedure): Post Debridement Measurements of Total Wound Length: (cm) 7.5 Stage: Unstageable/Unclassified Width: (cm) 6.5 Depth: (cm) 0.4 Volume: (cm) 15.315 Character of Wound/Ulcer Post Debridement: Stable Post Procedure Diagnosis Same as Pre-procedure Electronic Signature(s) Signed: 02/16/2021 2:16:03 PM By: Worthy Keeler PA-C Signed: 02/16/2021 5:07:41 PM By: Georges Mouse, Minus Breeding RN Entered By: Georges Mouse, Minus Breeding on 02/16/2021 12:01:01 Amy Moses ([id number]) -------------------------------------------------------------------------------- HPI Details Patient Name: Amy Moses Date of Service: 02/16/2021 10:45 AM Medical Record Number: [id number] Patient Account Number: [id number] Date of Birth/Sex: 1971-01-05 (50 y.o. F) Treating RN: Dolan Amen Primary Care Provider: Zenon Mayo Other Clinician: Jeanine Luz Referring Provider: Zenon Mayo Treating Provider/Extender: Skipper Cliche in Treatment: 10 History of Present Illness HPI Description: ADMISSION 12/02/2020 This is a 50 year old woman who is a type II diabetic. Although there are mentions of type 1 diabetes in epic clearly this woman is type II based on the fact that she was on oral agents for 10 years before starting insulin. She also is in chronic renal failure and recently started on dialysis I think in November. She relates her problems starting in September she started to develop skin excoriation on her bilateral inner thighs. She thought this was a friction phenomenon however the tissue wrist can progressively broke down. She was seen by her primary doctor on 10/21/2020 noted to have erythema on both thighs medially and a blister. MRIs were ordered and she was put on Silvadene and gauze. She was seen in the ER on 12/12 at the [place] [facility] also noted to have a right unstageable heel wound. 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  She dialyzes at [place] in [facility] and she was started on sodium thiosulfate as far as the notes state. The patient states she gets something at dialysis every day although she has not exactly sure what they are giving her. She was noted by vein and vascular in [place] to have multiple open wounds on 11/19/2020 using Xeroform gauze. She had an angiogram booked  by Dr. Oneida Alar predominantly I think because of the right heel ulcer although this was canceled by the patient because of the weather. The patient has large necrotic wounds on both inner thighs with covering black eschar. There is also an area on the left lateral thigh and an eschared area on her right plantar heel. She has been using Betadine to the right heel Xeroform to the areas on her legs. The patient has Medicaid but is able to get the Xeroform, were not really sure how she is managing this although she lives in [place] and there may be different rules for Medicaid in [place] versus [place]. We certainly would not be able to get that here. 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 I wondered whether her neuropathy is particularly affected her sensation of pain since her recent left patella fracture does not seem to have been that painful either Past medical history includes stage V chronic renal failure starting on dialysis I think in November, clearly type 2 diabetes with neuropathy, hypertension, glaucoma, vitamin D deficiency, history of cholecystectomy, edema of both legs, hypothyroidism, she dialyzes Tuesday Thursday and Saturday at [facility] in [place] The patient had arterial studies on 11/19/2020 I think at vein and vascular in [place]. She had biphasic waveforms throughout the thigh on the right and at the popliteal proximally and distally in the ATA but the PTA and peroneal were not visualized. On the left again  biphasic waveforms up to the level of the distal popliteal but not visualized in the tibial vessels. She was felt to have adequate flow where visualized. As noted she was supposed to have an angiogram which I think is certainly indicated [id number] upon evaluation today patient actually appears to be doing okay in regard to her wounds. This is actually the first time of seeing her she saw Dr. Dellia Nims at the last visit she does have quite an extensive history based on review. With that being said I do not see any signs right now of active infection which is great news. Overall I am extremely pleased with where things stand in that regard. 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 I really feel like she may do better with Dakin's moistened gauze. I do think that we can see about ordering the Dakin's for her she is already using gauze and then wrapping with roll gauze anyway so really would not change much except for moistening some gauze and applying it to the wound bed. With that being said I do not see any signs of active infection at this time which is great news. The patient all in all seems to be in fairly good spirits all things considered. 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 Fortunately it looks like some of the eschar is loosening up so we should be able to get this removed which hopefully will help in speeding up the healing process. With that being said with regard to the patient's gluteal region she unfortunately has new pressure ulcerations open today which I think could benefit from possibly having a air mattress. With that being said I explained to the patient that unfortunately this could still take some time as far as getting it to heal  completely. There does  not appear to be any signs of active infection at this time which is great news. No fevers, chills, nausea, vomiting, or diarrhea. 02/16/2021 upon evaluation today patient appears to be doing about the same in regard to her heel. Her gluteal area is actually getting better. She did have surgery in regard to her thighs bilaterally as well as her knee. It was not until she got into the operating room that it was noted that she had the wounds on the thighs. Subsequently according to the surgeons note dated 02/01/2021 they contacted the patient's daughter in order to discuss the fact they felt she needed to have the wound surgically debrided and a wound VAC placed and subsequently instead of patellar reconstruction they opted to proceed with removal of the Portion of the patella in order to get this area to heal more effectively and quickly. Overall that seems to have done quite well. Electronic Signature(s) Signed: 02/16/2021 7:18:53 PM By: Worthy Keeler PA-C Entered By: Worthy Keeler on 02/16/2021 19:18:53 Amy Moses ([id number]) -------------------------------------------------------------------------------- Physical Exam Details Patient Name: Amy Moses Date of Service: 02/16/2021 10:45 AM Medical Record Number: [id number] Patient Account Number: [id number] Date of Birth/Sex: [birth year] (49 y.o. F) Treating RN: Dolan Amen Primary Care Provider: Zenon Mayo Other Clinician: Jeanine Luz Referring Provider: Zenon Mayo Treating Provider/Extender: Skipper Cliche in Treatment: 107 Constitutional Well-nourished and well-hydrated in no acute distress. Respiratory normal breathing without difficulty. Psychiatric this patient is able to make decisions and demonstrates good insight into disease process. Alert and Oriented x 3. pleasant and cooperative. Notes Upon inspection patient's wounds actually showed signs of still on the heels showing eschar although this was  loosening up I did perform sharp debridement to remove part of this that was lifted up. The patient tolerated that today without complication. In regard to the thighs I did not evaluate this as it is being managed by her surgeon. With regard to the gluteal region all these wounds seem to be doing much better I think they are not completely healed but definitely are making progress here. Overall I feel like the patient is doing decently well considering she has a fairly significant medical history. Electronic Signature(s) Signed: 02/16/2021 7:19:36 PM By: Worthy Keeler PA-C Entered By: Worthy Keeler on 02/16/2021 19:19:36 Amy Moses ([id number]) -------------------------------------------------------------------------------- Physician Orders Details Patient Name: Amy Moses Date of Service: 02/16/2021 10:45 AM Medical Record Number: [id number] Patient Account Number: [id number] Date of Birth/Sex: 1971/05/20 (49 y.o. F) Treating RN: Dolan Amen Primary Care Provider: Zenon Mayo Other Clinician: Jeanine Luz Referring Provider: Zenon Mayo Treating Provider/Extender: Skipper Cliche in Treatment: 10 Verbal / Phone Orders: No Diagnosis Coding ICD-10 Coding Code Description E83.59 Other disorders of calcium metabolism L97.118 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 L97.128 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 L89.610 Pressure ulcer of right heel, unstageable E11.42 Type 2 diabetes mellitus with diabetic polyneuropathy L89.313 Pressure ulcer of right buttock, stage 3 L89.323 Pressure ulcer of left buttock, stage 3 Follow-up Appointments o Return Appointment in 2 weeks. [place]: - [facility] for wound care. May utilize formulary equivalent dressing for wound treatment orders unless otherwise specified. Home Health Nurse may visit [facility] to  address patientos wound care needs. - 3 x a week o **Please direct any NON-WOUND related issues/requests for orders to patient's Primary Care Physician. **If current dressing causes regression in wound condition,  may D/C ordered dressing product/s and apply Normal Saline Moist Dressing daily until next [facility] or Other MD appointment. **Notify [facility] of regression in wound condition at [phone]. Wound Treatment Wound #10 - Calf Wound Laterality: Right, Lateral Topical: Betadine (Generic) 3 x Per Week/30 Days Wound #4 - Calcaneus Wound Laterality: Right Topical: Betadine (Generic) 3 x Per Week/30 Days Discharge Instructions: apply to eascar Primary Dressing: Silvercel Small 2x2 (in/in) 3 x Per Week/30 Days Discharge Instructions: open areas Secondary Dressing: Mepilex Border Flex, 4x4 (in/in) (Generic) 3 x Per Week/30 Days Discharge Instructions: Apply to wound as directed. Do not cut. Wound #6 - Gluteus Wound Laterality: Right Peri-Wound Care: Desitin Maximum Strength Ointment 4 (oz) (Generic) 3 x Per Week/30 Days Peri-Wound Care: Skin Prep (Generic) 3 x Per Week/30 Days Discharge Instructions: Use skin prep as directed Primary Dressing: Silvercel Small 2x2 (in/in) (Generic) 3 x Per Week/30 Days Discharge Instructions: Apply Silvercel Small 2x2 (in/in) as instructed Secondary Dressing: Mepilex Border Flex, 4x4 (in/in) (Generic) 3 x Per Week/30 Days Discharge Instructions: Apply to wound as directed. Do not cut. Wound #7 - Gluteal fold Wound Laterality: Left Peri-Wound Care: Desitin Maximum Strength Ointment 4 (oz) (Generic) 3 x Per Week/30 Days Amy Moses, Amy Moses ([id number]) Peri-Wound Care: Skin Prep (Generic) 3 x Per Week/30 Days Discharge Instructions: Use skin prep as directed Primary Dressing: Silvercel Small 2x2 (in/in) (Generic) 3 x Per Week/30 Days Discharge Instructions: Apply Silvercel Small 2x2 (in/in) as instructed Electronic  Signature(s) Signed: 02/16/2021 5:07:41 PM By: Georges Mouse, Minus Breeding RN Signed: 02/16/2021 7:59:09 PM By: Worthy Keeler PA-C Previous Signature: 02/16/2021 2:16:03 PM Version By: Worthy Keeler PA-C Entered By: Georges Mouse, Minus Breeding on 02/16/2021 16:46:31 Amy Moses ([id number]) -------------------------------------------------------------------------------- Problem List Details Patient Name: Amy Moses Date of Service: 02/16/2021 10:45 AM Medical Record Number: [id number] Patient Account Number: [id number] Date of Birth/Sex: October 09, 1971 (49 y.o. F) Treating RN: Dolan Amen Primary Care Provider: Zenon Mayo Other Clinician: Jeanine Luz Referring Provider: Zenon Mayo Treating Provider/Extender: Skipper Cliche in Treatment: 10 Active Problems ICD-10 Encounter Code Description Active Date MDM Diagnosis E83.59 Other disorders of calcium metabolism 12/02/2020 No Yes L97.118 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 12/02/2020 No Yes L97.128 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 12/02/2020 No Yes L89.610 Pressure ulcer of right heel, unstageable 12/02/2020 No Yes E11.42 Type 2 diabetes mellitus with diabetic polyneuropathy 12/02/2020 No Yes L89.313 Pressure ulcer of right buttock, stage 3 01/19/2021 No Yes L89.323 Pressure ulcer of left buttock, stage 3 01/19/2021 No Yes Inactive Problems Resolved Problems Electronic Signature(s) Signed: 02/16/2021 11:45:38 AM By: Worthy Keeler PA-C Entered By: Worthy Keeler on 02/16/2021 11:45:38 Amy Moses ([id number]) -------------------------------------------------------------------------------- Progress Note Details Patient Name: Amy Moses Date of Service: 02/16/2021 10:45 AM Medical Record Number: [id number] Patient Account Number: [id number] Date of Birth/Sex: 1971/09/16 (49 y.o. F) Treating RN: Dolan Amen Primary Care Provider: Zenon Mayo Other Clinician: Jeanine Luz Referring Provider: Zenon Mayo Treating Provider/Extender: Skipper Cliche in Treatment: 10 Subjective Chief Complaint Information obtained from Patient 12/02/2020; patient is here for review of extensive wounds on her bilateral thighs as well as an area on her right plantar heel History of Present Illness (HPI) ADMISSION 12/02/2020 This is a 50 year old woman who is a type II diabetic. Although there are mentions of type 1 diabetes in epic clearly this woman is type II based on the fact that she was on oral agents for 10 years before starting  insulin. She also is in chronic renal failure and recently started on dialysis I think in November. She relates her problems starting in September she started to develop skin excoriation on her bilateral inner thighs. She thought this was a friction phenomenon however the tissue wrist can progressively broke down. She was seen by her primary doctor on 10/21/2020 noted to have erythema on both thighs medially and a blister. MRIs were ordered and she was put on Silvadene and gauze. She was seen in the ER on 12/12 at the [place] clinic also noted to have a right unstageable heel wound. 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 She dialyzes at [facility] in [name] and she was started on sodium thiosulfate as far as the notes state. The patient states she gets something at dialysis every day although she has not exactly sure what they are giving her. She was noted by vein and vascular in [place] to have multiple open wounds on 11/19/2020 using Xeroform gauze. She had an angiogram booked by Dr. Oneida Alar predominantly I think because of the right heel ulcer although this was canceled by the patient because of the weather. The patient has large necrotic wounds on both inner thighs with covering black eschar. There is also an area on the left lateral thigh and  an eschared area on her right plantar heel. She has been using Betadine to the right heel Xeroform to the areas on her legs. The patient has Medicaid but is able to get the Xeroform, were not really sure how she is managing this although she lives in [place] and there may be different rules for Medicaid in [place] versus [place]. We certainly would not be able to get that here. 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 I wondered whether her neuropathy is particularly affected her sensation of pain since her recent left patella fracture does not seem to have been that painful either Past medical history includes stage V chronic renal failure starting on dialysis I think in November, clearly type 2 diabetes with neuropathy, hypertension, glaucoma, vitamin D deficiency, history of cholecystectomy, edema of both legs, hypothyroidism, she dialyzes Tuesday Thursday and Saturday at [facility] in [place] The patient had arterial studies on 11/19/2020 I think at vein and vascular in [place]. She had biphasic waveforms throughout the thigh on the right and at the popliteal proximally and distally in the ATA but the PTA and peroneal were not visualized. On the left again biphasic waveforms up to the level of the distal popliteal but not visualized in the tibial vessels. She was felt to have adequate flow where visualized. As noted she was supposed to have an angiogram which I think is certainly indicated [id number] upon evaluation today patient actually appears to be doing okay in regard to her wounds. This is actually the first time of seeing her she saw Dr. Dellia Nims at the last visit she does have quite an extensive history based on review. With that being said I do not see any signs right now of active infection which is great news. Overall I am extremely pleased with where things stand in that regard. With that  being said I do not believe the Xeroform is doing much for the calciphylaxis areas on her thighs and lateral or medial locations. I really feel like she may do better with Dakin's moistened gauze. I do think that  we can see about ordering the Dakin's for her she is already using gauze and then wrapping with roll gauze anyway so really would not change much except for moistening some gauze and applying it to the wound bed. With that being said I do not see any signs of active infection at this time which is great news. The patient all in all seems to be in fairly good spirits all things considered. 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 Fortunately it looks like some of the eschar is loosening up so we should be able to get this removed which hopefully will help in speeding up the healing process. With that being said with regard to the patient's gluteal region she unfortunately has new pressure ulcerations open today which I think could benefit from possibly having a air mattress. With that being said I explained to the patient that unfortunately this could still take some time as far as getting it to heal completely. There does not appear to be any signs of active infection at this time which is great news. No fevers, chills, nausea, vomiting, or diarrhea. 02/16/2021 upon evaluation today patient appears to be doing about the same in regard to her heel. Her gluteal area is actually getting better. She did have surgery in regard to her thighs bilaterally as well as her knee. It was not until she got into the operating room that it was noted that she had the wounds on the thighs. Subsequently according to the surgeons note dated 02/01/2021 they contacted the patient's daughter in order to discuss the fact they felt she needed to have the wound surgically debrided and a  wound VAC placed and subsequently instead of patellar reconstruction they opted to proceed with removal of the Portion of the patella in order to get this area to heal more effectively and quickly. Overall that seems to have done quite well. Amy Moses, Amy Moses ([id number]) Objective Constitutional Well-nourished and well-hydrated in no acute distress. Vitals Time Taken: 10:50 AM, Height: 66 in, Weight: 235 lbs, BMI: 37.9, Temperature: 98.0 F, Pulse: 66 bpm, Respiratory Rate: 18 breaths/min, Blood Pressure: 150/89 mmHg. Respiratory normal breathing without difficulty. Psychiatric this patient is able to make decisions and demonstrates good insight into disease process. Alert and Oriented x 3. pleasant and cooperative. General Notes: Upon inspection patient's wounds actually showed signs of still on the heels showing eschar although this was loosening up I did perform sharp debridement to remove part of this that was lifted up. The patient tolerated that today without complication. In regard to the thighs I did not evaluate this as it is being managed by her surgeon. With regard to the gluteal region all these wounds seem to be doing much better I think they are not completely healed but definitely are making progress here. Overall I feel like the patient is doing decently well considering she has a fairly significant medical history. Integumentary (Hair, Skin) Wound #10 status is Open. Original cause of wound was Gradually Appeared. The date acquired was: 02/16/2021. The wound is located on the Right,Lateral Calf. The wound measures 2.5cm length x 1.1cm width x 0.1cm depth; 2.16cm^2 area and 0.216cm^3 volume. There is no tunneling or undermining noted. There is a none present amount of drainage noted. There is no granulation within the wound bed. There is a large (67-100%) amount of necrotic tissue within the wound bed including Eschar.  Wound #4 status is Open. Original cause of wound was  Gradually Appeared. The date acquired was: 10/21/2020. The wound has been in treatment 10 weeks. The wound is located on the Right Calcaneus. The wound measures 7.5cm length x 6.5cm width x 0.4cm depth; 38.288cm^2 area and 15.315cm^3 volume. There is no tunneling or undermining noted. There is a small amount of serosanguineous drainage noted. There is no granulation within the wound bed. There is a large (67-100%) amount of necrotic tissue within the wound bed including Eschar. Wound #6 status is Open. Original cause of wound was Gradually Appeared. The date acquired was: 12/22/2020. The wound has been in treatment 4 weeks. The wound is located on the Right Gluteus. The wound measures 7.5cm length x 6cm width x 0.1cm depth; 35.343cm^2 area and 3.534cm^3 volume. There is Fat Layer (Subcutaneous Tissue) exposed. There is no tunneling or undermining noted. There is a medium amount of serosanguineous drainage noted. There is large (67-100%) red, pink granulation within the wound bed. There is a small (1-33%) amount of necrotic tissue within the wound bed including Adherent Slough. Wound #7 status is Open. Original cause of wound was Gradually Appeared. The date acquired was: 12/22/2020. The wound has been in treatment 4 weeks. The wound is located on the Left Gluteal fold. The wound measures 2.6cm length x 0.9cm width x 0.1cm depth; 1.838cm^2 area and 0.184cm^3 volume. There is Fat Layer (Subcutaneous Tissue) exposed. There is no tunneling or undermining noted. There is a medium amount of serosanguineous drainage noted. There is small (1-33%) red, pink granulation within the wound bed. There is a large (67-100%) amount of necrotic tissue within the wound bed including Eschar and Adherent Slough. Wound #8 status is Open. Original cause of wound was Gradually Appeared. The date acquired was: 12/22/2020. The wound has been in treatment 4 weeks. The wound is located on the Left Gluteus. The wound measures 0.3cm  length x 1.3cm width x 0.1cm depth; 0.306cm^2 area and 0.031cm^3 volume. There is Fat Layer (Subcutaneous Tissue) exposed. There is no tunneling or undermining noted. There is a medium amount of serosanguineous drainage noted. There is large (67-100%) red, pink granulation within the wound bed. There is a small (1-33%) amount of necrotic tissue within the wound bed including Adherent Slough. Wound #9 status is Open. Original cause of wound was Gradually Appeared. The date acquired was: 02/16/2021. The wound is located on the Left,Lateral Calf. The wound measures 4cm length x 2.2cm width x 0.1cm depth; 6.912cm^2 area and 0.691cm^3 volume. There is no tunneling or undermining noted. There is a none present amount of drainage noted. There is no granulation within the wound bed. There is a large (67- 100%) amount of necrotic tissue within the wound bed including Eschar. 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 There was a Excisional Skin/Subcutaneous Tissue Debridement with a total area of 3 sq cm performed by Tommie Sams., PA-C. With the following instrument(s): Curette to remove Viable and Non-Viable tissue/material. Material removed includes Eschar and Subcutaneous Tissue and. A time out was conducted at 12:00, prior to the start of  the procedure. A Minimum amount of bleeding was controlled with Pressure. The procedure was tolerated well. Post Debridement Measurements: 7.5cm length x 6.5cm width x 0.4cm depth;  15.315cm^3 volume. Post debridement Stage noted as Unstageable/Unclassified. Character of Wound/Ulcer Post Debridement is stable. Post procedure Diagnosis Wound #4: Same as Pre-Procedure Plan Follow-up Appointments: Return Appointment in 2 weeks. Home Health: [facility]: - Inter[facility] for wound care. May utilize formulary equivalent dressing for wound treatment orders unless otherwise specified. Home Health Nurse may visit [facility] to address patient s wound care needs. - 3 x a week **Please direct any NON-WOUND related issues/requests for orders to patient's Primary Care Physician. **If current dressing causes regression in wound condition, may D/C ordered dressing product/s and apply Normal Saline Moist Dressing daily until next [facility] or Other MD appointment. **Notify [facility] of regression in wound condition at [phone]. WOUND #10: - Calf Wound Laterality: Right, Lateral Topical: Betadine (Generic) 3 x Per Week/30 Days WOUND #4: - Calcaneus Wound Laterality: Right Topical: Betadine (Generic) 3 x Per Week/30 Days Discharge Instructions: apply to eascar Primary Dressing: Silvercel Small 2x2 (in/in) 3 x Per Week/30 Days Discharge Instructions: open areas Secondary Dressing: Mepilex Border Flex, 4x4 (in/in) (Generic) 3 x Per Week/30 Days Discharge Instructions: Apply to wound as directed. Do not cut. WOUND #6: - Gluteus Wound Laterality: Right Peri-Wound Care: Desitin Maximum Strength Ointment 4 (oz) (Generic) 3 x Per Week/30 Days Peri-Wound Care: Skin Prep (Generic) 3 x Per Week/30 Days Discharge Instructions: Use skin prep as directed Primary Dressing: Silvercel Small 2x2 (in/in) (Generic) 3 x Per Week/30 Days Discharge Instructions: Apply Silvercel Small 2x2 (in/in) as instructed Secondary Dressing: Mepilex Border Flex, 4x4 (in/in) (Generic) 3 x Per Week/30 Days Discharge Instructions: Apply to wound as directed. Do not  cut. WOUND #7: - Gluteal fold Wound Laterality: Left Peri-Wound Care: Desitin Maximum Strength Ointment 4 (oz) (Generic) 3 x Per Week/30 Days Peri-Wound Care: Skin Prep (Generic) 3 x Per Week/30 Days Discharge Instructions: Use skin prep as directed Primary Dressing: Silvercel Small 2x2 (in/in) (Generic) 3 x Per Week/30 Days Discharge Instructions: Apply Silvercel Small 2x2 (in/in) as instructed 1. Would recommend currently that we continue with the silver alginate to all open wound locations. She is in agreement that plan. 2. Also can continue with Betadine to all other locations which are closed and mainly eschar covered and stable. 3. With regard to the thighs this is being managed by her surgeon therefore I did not do anything to intervene in this location. We will see patient back for reevaluation in 2 weeks here in the clinic. If anything worsens or changes patient will contact our office for additional recommendations. I did review the patient's hospital records as best I could during the limited time I had today. This included the actual surgical note which are printed off as well as part of the hospital stay. Electronic Signature(s) Signed: 02/16/2021 7:22:45 PM By: Worthy Keeler PA-C Previous Signature: 02/16/2021 7:20:13 PM Version By: Worthy Keeler PA-C Previous Signature: 02/16/2021 7:20:00 PM Version By: Worthy Keeler PA-C Entered By: Worthy Keeler on 02/16/2021 19:22:44 Amy Moses ([id number]) -------------------------------------------------------------------------------- SuperBill Details Patient Name: Amy Moses Date of Service: 02/16/2021 Medical Record Number: [id number] Patient Account Number: [id number] Date of Birth/Sex: 1971/07/14 (49 y.o. 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 L97.128 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 L89.610 Pressure ulcer of right heel, unstageable E11.42 Type 2 diabetes mellitus with diabetic polyneuropathy L89.313 Pressure ulcer of right buttock, stage 3 L89.323 Pressure ulcer of left buttock, stage 3 Facility Procedures CPT4 Code: [id number] Description: [id number] - DEB SUBQ TISSUE 20 SQ CM/< Modifier: Quantity: 1 CPT4 Code: Description: ICD-10 Diagnosis Description L89.610 Pressure ulcer of right heel, unstageable Modifier: Quantity: Physician Procedures CPT4 [id number] Description: [id number] - WC PHYS LEVEL 3 - EST PT Modifier: 25 Quantity: 1 CPT4 Code: Description: ICD-10 Diagnosis Description E83.59 Other disorders of calcium metabolism L97.118 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 L97.128 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 L89.610  Pressure ulcer of right heel, unstageable Modifier: Quantity: CPT4 Code: [id number] Description: 11042 - WC PHYS SUBQ TISS 20 SQ CM Modifier: Quantity: 1 CPT4 Code: Description: ICD-10 Diagnosis Description L89.610 Pressure ulcer of right heel, unstageable Modifier: Quantity: Electronic Signature(s) Signed: 02/16/2021 7:22:54 PM By: Worthy Keeler PA-C Previous Signature: 02/16/2021 7:20:39 PM Version By: Worthy Keeler PA-C Previous Signature: 02/16/2021 1:07:48 PM Version By: Georges Mouse, Minus Breeding RN Previous Signature: 02/16/2021 2:16:03 PM Version By: Worthy Keeler PA-C Entered By: Worthy Keeler on 02/16/2021 19:22:54

## 2021-02-18 ENCOUNTER — Emergency Department (HOSPITAL_COMMUNITY)
Admission: EM | Admit: 2021-02-18 | Discharge: 2021-02-18 | Disposition: A | Discharge status: Home / Self Care | Payer: Medicaid - Out of State | Attending: Emergency Medicine | Admitting: Emergency Medicine

## 2021-02-18 ENCOUNTER — Encounter (HOSPITAL_COMMUNITY): Payer: Self-pay | Admitting: Pharmacy Technician

## 2021-02-18 DIAGNOSIS — L97925 Non-pressure chronic ulcer of unspecified part of left lower leg with muscle involvement without evidence of necrosis: Secondary | ICD-10-CM | POA: Insufficient documentation

## 2021-02-18 DIAGNOSIS — L97902 Non-pressure chronic ulcer of unspecified part of unspecified lower leg with fat layer exposed: Secondary | ICD-10-CM

## 2021-02-18 DIAGNOSIS — L97915 Non-pressure chronic ulcer of unspecified part of right lower leg with muscle involvement without evidence of necrosis: Secondary | ICD-10-CM | POA: Insufficient documentation

## 2021-02-18 DIAGNOSIS — N186 End stage renal disease: Secondary | ICD-10-CM | POA: Diagnosis not present

## 2021-02-18 DIAGNOSIS — E039 Hypothyroidism, unspecified: Secondary | ICD-10-CM | POA: Diagnosis not present

## 2021-02-18 DIAGNOSIS — L97509 Non-pressure chronic ulcer of other part of unspecified foot with unspecified severity: Secondary | ICD-10-CM | POA: Insufficient documentation

## 2021-02-18 DIAGNOSIS — Z79899 Other long term (current) drug therapy: Secondary | ICD-10-CM | POA: Insufficient documentation

## 2021-02-18 DIAGNOSIS — Z992 Dependence on renal dialysis: Secondary | ICD-10-CM | POA: Insufficient documentation

## 2021-02-18 DIAGNOSIS — E11621 Type 2 diabetes mellitus with foot ulcer: Secondary | ICD-10-CM | POA: Insufficient documentation

## 2021-02-18 DIAGNOSIS — E1122 Type 2 diabetes mellitus with diabetic chronic kidney disease: Secondary | ICD-10-CM | POA: Insufficient documentation

## 2021-02-18 DIAGNOSIS — I120 Hypertensive chronic kidney disease with stage 5 chronic kidney disease or end stage renal disease: Secondary | ICD-10-CM | POA: Insufficient documentation

## 2021-02-18 DIAGNOSIS — Z794 Long term (current) use of insulin: Secondary | ICD-10-CM | POA: Insufficient documentation

## 2021-02-18 LAB — COMPREHENSIVE METABOLIC PANEL
ALT: 7 U/L (ref 0–44)
AST: 9 U/L — ABNORMAL LOW (ref 15–41)
Albumin: 2.7 g/dL — ABNORMAL LOW (ref 3.5–5.0)
Alkaline Phosphatase: 102 U/L (ref 38–126)
Anion gap: 17 — ABNORMAL HIGH (ref 5–15)
BUN: 41 mg/dL — ABNORMAL HIGH (ref 6–20)
CO2: 23 mmol/L (ref 22–32)
Calcium: 10 mg/dL (ref 8.9–10.3)
Chloride: 98 mmol/L (ref 98–111)
Creatinine, Ser: 5.13 mg/dL — ABNORMAL HIGH (ref 0.44–1.00)
GFR, Estimated: 10 mL/min — ABNORMAL LOW (ref >60)
Glucose, Bld: 262 mg/dL — ABNORMAL HIGH (ref 70–99)
Potassium: 4 mmol/L (ref 3.5–5.1)
Sodium: 138 mmol/L (ref 135–145)
Total Bilirubin: 0.6 mg/dL (ref 0.3–1.2)
Total Protein: 6.3 g/dL — ABNORMAL LOW (ref 6.5–8.1)

## 2021-02-18 LAB — CBC WITH DIFFERENTIAL/PLATELET
Abs Immature Granulocytes: 0.08 10*3/uL — ABNORMAL HIGH (ref 0.00–0.07)
Basophils Absolute: 0.1 10*3/uL (ref 0.0–0.1)
Basophils Relative: 0 %
Eosinophils Absolute: 0.2 10*3/uL (ref 0.0–0.5)
Eosinophils Relative: 1 %
HCT: 28.9 % — ABNORMAL LOW (ref 36.0–46.0)
Hemoglobin: 8.5 g/dL — ABNORMAL LOW (ref 12.0–15.0)
Immature Granulocytes: 1 %
Lymphocytes Relative: 10 %
Lymphs Abs: 1.3 10*3/uL (ref 0.7–4.0)
MCH: 26 pg (ref 26.0–34.0)
MCHC: 29.4 g/dL — ABNORMAL LOW (ref 30.0–36.0)
MCV: 88.4 fL (ref 80.0–100.0)
Monocytes Absolute: 0.9 10*3/uL (ref 0.1–1.0)
Monocytes Relative: 7 %
Neutro Abs: 9.8 10*3/uL — ABNORMAL HIGH (ref 1.7–7.7)
Neutrophils Relative %: 81 %
Platelets: 333 10*3/uL (ref 150–400)
RBC: 3.27 MIL/uL — ABNORMAL LOW (ref 3.87–5.11)
RDW: 18.1 % — ABNORMAL HIGH (ref 11.5–15.5)
WBC: 12.3 10*3/uL — ABNORMAL HIGH (ref 4.0–10.5)
nRBC: 0 % (ref 0.0–0.2)

## 2021-02-18 NOTE — ED Provider Notes (Signed)
[facility] EMERGENCY DEPARTMENT Provider Note   CSN: [id number] Arrival date & time: 02/18/21  [id number]     History No chief complaint on file.   Amy Moses is a 50 y.o. female with a past medical history of brittle diabetes, blindness of the right eye, hypothyroidism, end-stage renal disease on hemodialysis since November.  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  The patient has been seen by both vascular surgery for assessment of her flow which is good as well as she has a chronic wound care.  The patient last saw her wound care doctor on 29 March 2 days ago, she has a home health nurse who changes her wound vacs.  She states that her wound vacs were not taken down by her doctor.  She has pictures of what her wounds look like on Monday and then today she her home health nurse noticed development of eschar over the wounds.  The patient states that she was very concerned that she might have infection and will be unable to see her primary wound care doctor until Friday and states that she was too nervous to wait.  She denies fevers, chills, wound pain.  HPI     Past Medical History:  Diagnosis Date  . Anemia   . 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 Diabetes mellitus, type II ([place])   . Dyslipidemia   . Glaucoma   . Hypertension   . Hypothyroidism (acquired)   . Kidney disease    Stage 5  . Pneumonia     Patient Active Problem List   Diagnosis Date Noted  . End stage renal disease ([place]) 11/16/2020  . Calciphylaxis 11/06/2020  . Non-healing open wound of heel 11/03/2020  . Diabetic foot infection ([place]) 11/01/2020  . Decubitus ulcer, heel 11/01/2020  . Closed nondisplaced fracture of left patella 10/29/2020  . Metabolic acidosis [id number]  . Acute on chronic renal failure ([place]) 06/10/2020  . Symptomatic anemia 06/10/2020  . Acute pericardial effusion 06/10/2020  . Class 2  obesity due to excess calories with body mass index (BMI) of 38.0 to 38.9 in adult 06/10/2020  . Other acute nonsuppurative otitis media, unspecified ear 11/29/2019  . Chronic kidney disease, stage 4 (severe) ([place]) 03/05/2019  . Skin ulcer, limited to breakdown of skin ([place]) 01/28/2019  . Vitamin D deficiency 01/28/2019  . Uncontrolled type 2 diabetes mellitus with chronic kidney disease, with long-term current use of insulin ([place]) 09/21/2015  . Hyperlipidemia 09/21/2015  . Essential hypertension, benign 09/21/2015  . Primary hypothyroidism 09/21/2015  . Iris bomb 07/31/2012  . Secondary angle-closure glaucoma 07/31/2012    Past Surgical History:  Procedure Laterality Date  . ABDOMINAL AORTOGRAM W/LOWER EXTREMITY Bilateral 12/18/2020   Procedure: ABDOMINAL AORTOGRAM W/LOWER EXTREMITY;  Surgeon: Elam Dutch, MD;  Location: [facility];  Service: Cardiovascular;  Laterality: Bilateral;  . ANKLE FRACTURE SURGERY    . AV FISTULA PLACEMENT Left 08/18/2020   Procedure: LEFT ARM BRACHIOCEPHALIC ARTERIOVENOUS (AV) FISTULA CREATION;  Surgeon: Elam Dutch, MD;  Location: [facility];  Service: Vascular;  Laterality: Left;  . CESAREAN SECTION    . CHOLECYSTECTOMY    . EYE SURGERY     Vatrectomy  . IR PERC TUN PERIT CATH WO PORT S&I D[facility]a [facility]  09/15/2020  . IR US GUIDE VASC ACCESS RIGHT  09/15/2020  . TOE SURGERY       OB History  No obstetric history on file.     Family History  Problem Relation Age of Onset  . Heart disease Mother   . Diabetes Mother   . Kidney disease Mother   . Diabetes Father   . Heart disease Father   . Diabetes Brother     Social History   Tobacco Use  . Smoking status: Never Smoker  . Smokeless tobacco: Never Used  Vaping Use  . Vaping Use: Never used  Substance Use Topics  . Alcohol use: No  . Drug use: No    Home Medications Prior to Admission medications   Medication Sig Start Date End Date Taking? Authorizing Provider  acetaminophen  (TYLENOL) 500 MG tablet Take 1,000 mg by mouth every 6 (six) hours as needed for moderate pain.    [provider]  ANTACID EXTRA STRENGTH 750 MG chewable tablet Chew 750 mg by mouth daily. 08/14/20   [provider]  atorvastatin (LIPITOR) 10 MG tablet Take 10 mg by mouth daily. 05/14/20   [provider]  epoetin alfa (EPOGEN) 4000 UNIT/ML injection Inject 4,000 Units into the skin every 14 (fourteen) days.  06/19/20   [provider]  HUMALOG KWIKPEN 100 UNIT/ML KwikPen Inject 5-15 Units into the skin 3 (three) times daily with meals. 03/03/20   [provider]  HYDROcodone-acetaminophen (NORCO) 5-325 MG tablet Take 1 tablet by mouth every 6 (six) hours as needed for moderate pain. Patient not taking: No sig reported 11/19/20   Dagoberto Ligas, PA-C  insulin glargine (LANTUS) 100 UNIT/ML injection Inject 50 Units into the skin at bedtime.    [provider]  levothyroxine (SYNTHROID) 50 MCG tablet Take 50 mcg by mouth daily before breakfast.    [provider]  metoprolol succinate (TOPROL-XL) 50 MG 24 hr tablet Take 50 mg by mouth at bedtime. Take with or immediately following a meal.    [provider]  sevelamer carbonate (RENVELA) 800 MG tablet Take 800 mg by mouth 3 (three) times daily with meals. 10/14/20   [provider]  sodium bicarbonate 650 MG tablet Take 1,300 mg by mouth 3 (three) times daily.    [provider]  torsemide (DEMADEX) 100 MG tablet Take 150 mg by mouth daily.  05/22/20   [provider]    Allergies    Ace inhibitors  Review of Systems   Review of Systems Ten systems reviewed and are negative for acute change, except as noted in the HPI.   Physical Exam Updated Vital Signs BP (!) 146/81 (BP Location: Right Arm)   Pulse 71   Temp 98.4 F (36.9 C) (Oral)   Resp 18   LMP 08/22/2015 (Approximate)   SpO2 100%   Physical Exam Vitals and nursing note reviewed.   Constitutional:      General: She is not in acute distress.    Appearance: She is well-developed. She is not diaphoretic.  HENT:     Head: Normocephalic and atraumatic.  Eyes:     General: No scleral icterus.    Conjunctiva/sclera: Conjunctivae normal.  Cardiovascular:     Rate and Rhythm: Normal rate and regular rhythm.     Heart sounds: Normal heart sounds. No murmur heard. No friction rub. No gallop.   Pulmonary:     Effort: Pulmonary effort is normal. No respiratory distress.     Breath sounds: Normal breath sounds.  Abdominal:     General: Bowel sounds are normal. There is no distension.  Palpations: Abdomen is soft. There is no mass.     Tenderness: There is no abdominal tenderness. There is no guarding.  Musculoskeletal:     Cervical back: Normal range of motion.     Comments: BL Wound Vacs in place pink wound margins all around the edges.  No erythema or tenderness surrounding the wounds.  Pictures provided by patient show large area of stage III ulceration, pink granulation tissue and mild light brown eschar over the tissue space.  Skin:    General: Skin is warm and dry.  Neurological:     Mental Status: She is alert and oriented to person, place, and time.  Psychiatric:        Behavior: Behavior normal.     ED Results / Procedures / Treatments   Labs (all labs ordered are listed, but only abnormal results are displayed) Labs Reviewed  COMPREHENSIVE METABOLIC PANEL - Abnormal; Notable for the following components:      Result Value   Glucose, Bld 262 (*)    BUN 41 (*)    Creatinine, Ser 5.13 (*)    Total Protein 6.3 (*)    Albumin 2.7 (*)    AST 9 (*)    GFR, Estimated 10 (*)    Anion gap 17 (*)    All other components within normal limits  CBC WITH DIFFERENTIAL/PLATELET - Abnormal; Notable for the following components:   WBC 12.3 (*)    RBC 3.27 (*)    Hemoglobin 8.5 (*)    HCT 28.9 (*)    MCHC 29.4 (*)    RDW 18.1 (*)    Neutro Abs 9.8 (*)    Abs  Immature Granulocytes 0.08 (*)    All other components within normal limits    EKG None  Radiology No results found.  Procedures Procedures   Medications Ordered in ED Medications - No data to display  ED Course  I have reviewed the triage vital signs and the nursing notes.  Pertinent labs & imaging results that were available during my care of the patient were reviewed by me and considered in my medical decision making (see chart for details).    MDM Rules/Calculators/A&P                          Patient seen and shared visit with Dr. Billy Fischer.  We both reviewed the patient's home pictures and do not feel that the there is benefit to remove her wound vacs in the ER. I reviewed the patient's lab which shows creatinine of 5.13 consistent with end-stage renal disease, glucose of 262, CBC with mild leukocytosis of 12.3.  I have advised the patient that she should follow very closely with her wound care specialist and go to the office if necessary that we do not have wound care team in the emergency department nor do we have supplies to replace her wound vacs considering the fact that she is afebrile and hemodynamically stable and wounds appear well both on pictures and under the wound vacs as I am able to see it.  She appears appropriate for discharge at this time with very close outpatient follow-up. Final Clinical Impression(s) / ED Diagnoses Final diagnoses:  None    Rx / DC Orders ED Discharge Orders    None       Margarita Mail, PA-C 02/18/21 2216    Gareth Morgan, MD 02/19/21 1206

## 2021-02-18 NOTE — Discharge Instructions (Addendum)
Get help right away if: You have a red streak of skin near the area around your wound. Your wound has been closed with staples, sutures, skin glue, or adhesive strips and it begins to open up and separate. Your wound is bleeding, and the bleeding does not stop with gentle pressure. You have a rash. You faint. You have trouble breathing. 

## 2021-02-18 NOTE — ED Triage Notes (Signed)
Pt here with wounds to legs, wound vac applied. Home health nurse noticed wounds with eschar to some areas of wounds. Pt concerned for infection and would like labs drawn.

## 2021-02-19 ENCOUNTER — Other Ambulatory Visit: Payer: Self-pay

## 2021-02-19 ENCOUNTER — Ambulatory Visit: Payer: Medicaid - Out of State | Admitting: Physician Assistant

## 2021-02-19 ENCOUNTER — Ambulatory Visit (HOSPITAL_COMMUNITY)
Admission: RE | Admit: 2021-02-19 | Discharge: 2021-02-19 | Disposition: A | Discharge status: Home / Self Care | Payer: Medicaid - Out of State | Source: Ambulatory Visit | Attending: Nephrology | Admitting: Nephrology

## 2021-02-19 DIAGNOSIS — N186 End stage renal disease: Secondary | ICD-10-CM | POA: Diagnosis not present

## 2021-02-19 DIAGNOSIS — Z4901 Encounter for fitting and adjustment of extracorporeal dialysis catheter: Secondary | ICD-10-CM | POA: Insufficient documentation

## 2021-02-19 HISTORY — PX: IR REMOVAL TUN CV CATH W/O FL: IMG2289

## 2021-02-19 MED ORDER — LIDOCAINE HCL 1 % IJ SOLN
INTRAMUSCULAR | Status: AC
  Filled 2021-02-19: qty 20

## 2021-02-19 MED ORDER — LIDOCAINE HCL (PF) 1 % IJ SOLN
INTRAMUSCULAR | Status: AC | PRN
  Administered 2021-02-19: 10 mL

## 2021-02-19 NOTE — Procedures (Signed)
Pre procedural Dx: ESRD Post procedural Dx: Same  Successful removal of tunneled HD catheter. Catheter removed intact   EBL: None No immediate complications.  Please see imaging section of Epic for full dictation.  Jacqualine Mau NP 02/19/2021 11:03 AM

## 2021-02-26 ENCOUNTER — Encounter: Payer: Medicaid - Out of State | Attending: Physician Assistant | Admitting: Physician Assistant

## 2021-02-26 ENCOUNTER — Other Ambulatory Visit: Payer: Self-pay

## 2021-02-26 DIAGNOSIS — I120 Hypertensive chronic kidney disease with stage 5 chronic kidney disease or end stage renal disease: Secondary | ICD-10-CM | POA: Insufficient documentation

## 2021-02-26 DIAGNOSIS — N186 End stage renal disease: Secondary | ICD-10-CM | POA: Insufficient documentation

## 2021-02-26 DIAGNOSIS — E11621 Type 2 diabetes mellitus with foot ulcer: Secondary | ICD-10-CM | POA: Diagnosis not present

## 2021-02-26 DIAGNOSIS — E1122 Type 2 diabetes mellitus with diabetic chronic kidney disease: Secondary | ICD-10-CM | POA: Insufficient documentation

## 2021-02-26 DIAGNOSIS — L89313 Pressure ulcer of right buttock, stage 3: Secondary | ICD-10-CM | POA: Insufficient documentation

## 2021-02-26 DIAGNOSIS — E119 Type 2 diabetes mellitus without complications: Secondary | ICD-10-CM | POA: Insufficient documentation

## 2021-02-26 DIAGNOSIS — L89610 Pressure ulcer of right heel, unstageable: Secondary | ICD-10-CM | POA: Insufficient documentation

## 2021-02-26 DIAGNOSIS — Z992 Dependence on renal dialysis: Secondary | ICD-10-CM | POA: Diagnosis not present

## 2021-02-26 DIAGNOSIS — Z794 Long term (current) use of insulin: Secondary | ICD-10-CM | POA: Insufficient documentation

## 2021-02-26 DIAGNOSIS — L89323 Pressure ulcer of left buttock, stage 3: Secondary | ICD-10-CM | POA: Diagnosis not present

## 2021-02-26 NOTE — Progress Notes (Addendum)
Amy Moses ([id number]) Visit Report for 02/26/2021 Chief Complaint Document Details Patient Name: Amy Moses Date of Service: 02/26/2021 9:00 AM Medical Record Number: [id number] Patient Account Number: [id number] Date of Birth/Sex: 06/24/1971 (50 y.o. F) Treating RN: Carlene Coria Primary Care Provider: Zenon Mayo Other Clinician: Referring Provider: Zenon Mayo Treating Provider/Extender: Skipper Cliche in Treatment: 12 Information Obtained from: Patient Chief Complaint 12/02/2020; patient is here for review of extensive wounds on her bilateral thighs as well as an area on her right plantar heel Electronic Signature(s) Signed: 02/26/2021 9:37:46 AM By: Worthy Keeler PA-C Entered By: Worthy Keeler on 02/26/2021 09:37:46 Amy Moses ([id number]) -------------------------------------------------------------------------------- HPI Details Patient Name: Amy Moses Date of Service: 02/26/2021 9:00 AM Medical Record Number: [id number] Patient Account Number: [id number] Date of Birth/Sex: [birth year] (49 y.o. F) Treating RN: Carlene Coria Primary Care Provider: Zenon Mayo Other Clinician: Referring Provider: Zenon Mayo Treating Provider/Extender: Skipper Cliche in Treatment: 12 History of Present Illness HPI Description: ADMISSION 12/02/2020 This is a 50 year old woman who is a type II diabetic. Although there are mentions of type 1 diabetes in epic clearly this woman is type II based on the fact that she was on oral agents for 10 years before starting insulin. She also is in chronic renal failure and recently started on dialysis I think in November. She relates her problems starting in September she started to develop skin excoriation on her bilateral inner thighs. She thought this was a friction phenomenon however the tissue wrist can progressively broke down. She was seen by her primary doctor on 10/21/2020 noted to have erythema on both thighs  medially and a blister. MRIs were ordered and she was put on Silvadene and gauze. She was seen in the ER on 12/12 at the [place] clinic also noted to have a right unstageable heel wound. 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 She dialyzes at [facility] in [facility] and she was started on sodium thiosulfate as far as the notes state. The patient states she gets something at dialysis every day although she has not exactly sure what they are giving her. She was noted by vein and vascular in [place] to have multiple open wounds on 11/19/2020 using Xeroform gauze. She had an angiogram booked by Dr. Oneida Alar predominantly I think because of the right heel ulcer although this was canceled by the patient because of the weather. The patient has large necrotic wounds on both inner thighs with covering black eschar. There is also an area on the left lateral thigh and an eschared area on her right plantar heel. She has been using Betadine to the right heel Xeroform to the areas on her legs. The patient has Medicaid but is able to get the Xeroform, were not really sure how she is managing this although she lives in [place] and there may be different rules for Medicaid in [place] versus [place]. We certainly would not be able to get that here. 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 I wondered whether her neuropathy is particularly affected her sensation of pain since her recent left patella fracture does not seem to have been that painful either Past medical history includes stage V chronic renal failure starting on dialysis I think in November, clearly type 2 diabetes with neuropathy, hypertension, glaucoma, vitamin D deficiency, history of cholecystectomy, edema of both legs, hypothyroidism, she dialyzes Tuesday  Thursday and Saturday at [facility] in  [place] The patient had arterial studies on 11/19/2020 I think at vein and vascular in [place]. She had biphasic waveforms throughout the thigh on the right and at the popliteal proximally and distally in the ATA but the PTA and peroneal were not visualized. On the left again biphasic waveforms up to the level of the distal popliteal but not visualized in the tibial vessels. She was felt to have adequate flow where visualized. As noted she was supposed to have an angiogram which I think is certainly indicated 01/21/3556 upon evaluation today patient actually appears to be doing okay in regard to her wounds. This is actually the first time of seeing her she saw Dr. Dellia Nims at the last visit she does have quite an extensive history based on review. With that being said I do not see any signs right now of active infection which is great news. Overall I am extremely pleased with where things stand in that regard. 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 I really feel like she may do better with Dakin's moistened gauze. I do think that we can see about ordering the Dakin's for her she is already using gauze and then wrapping with roll gauze anyway so really would not change much except for moistening some gauze and applying it to the wound bed. With that being said I do not see any signs of active infection at this time which is great news. The patient all in all seems to be in fairly good spirits all things considered. 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 Fortunately it looks like some of the eschar is loosening up so we should be able to get this removed which hopefully will help in speeding up the healing process. With that being said with regard to the patient's  gluteal region she unfortunately has new pressure ulcerations open today which I think could benefit from possibly having a air mattress. With that being said I explained to the patient that unfortunately this could still take some time as far as getting it to heal completely. There does not appear to be any signs of active infection at this time which is great news. No fevers, chills, nausea, vomiting, or diarrhea. 02/16/2021 upon evaluation today patient appears to be doing about the same in regard to her heel. Her gluteal area is actually getting better. She did have surgery in regard to her thighs bilaterally as well as her knee. It was not until she got into the operating room that it was noted that she had the wounds on the thighs. Subsequently according to the surgeons note dated 02/01/2021 they contacted the patient's daughter in order to discuss the fact they felt she needed to have the wound surgically debrided and a wound VAC placed and subsequently instead of patellar reconstruction they opted to proceed with removal of the Portion of the patella in order to get this area to heal more effectively and quickly. Overall that seems to have done quite well. 02/26/2021 upon evaluation today patient's wounds actually appear to be doing excellent pretty much across the board I am very pleased. With that being said the wounds where she has the wound vacs placed on the thighs in particular seems to be doing a very  good job as far as healing is concerned. There does not appear to be any evidence of infection which is great news and overall I am extremely pleased in that regard. No fevers, chills, nausea, vomiting, or diarrhea. Electronic Signature(s) Signed: 02/26/2021 5:22:03 PM By: Worthy Keeler PA-C Entered By: Worthy Keeler on 02/26/2021 17:22:03 Amy Moses ([id number]) -------------------------------------------------------------------------------- Physical Exam Details Patient Name:  Amy Moses Date of Service: 02/26/2021 9:00 AM Medical Record Number: [id number] Patient Account Number: [id number] Date of Birth/Sex: 18-Jul-1971 (49 y.o. F) Treating RN: Carlene Coria Primary Care Provider: Zenon Mayo Other Clinician: Referring Provider: Zenon Mayo Treating Provider/Extender: Skipper Cliche in Treatment: 23 Constitutional Well-nourished and well-hydrated in no acute distress. Respiratory normal breathing without difficulty. Psychiatric this patient is able to make decisions and demonstrates good insight into disease process. Alert and Oriented x 3. pleasant and cooperative. Notes Patient's wound bed did not require any sharp debridement at any location today that necessitated being performed. I think that in general she seems to be making good progress and even the areas over the thighs I think is doing quite well. I do not see anything of concern although I think we may want to avoid using the Adaptic to the wounds themselves I think just using the granular foam would be appropriate and then subsequently that will also help clean away some of the necrotic debris noted as well. As for regard to the legs and the heel I think we will keep the dressings the same and no debridement was performed today. Electronic Signature(s) Signed: 02/26/2021 5:22:41 PM By: Worthy Keeler PA-C Entered By: Worthy Keeler on 02/26/2021 17:22:41 Amy Moses ([id number]) -------------------------------------------------------------------------------- Physician Orders Details Patient Name: Amy Moses Date of Service: 02/26/2021 9:00 AM Medical Record Number: [id number] Patient Account Number: [id number] Date of Birth/Sex: 1971/02/08 (49 y.o. F) Treating RN: Cornell Barman Primary Care Provider: Zenon Mayo Other Clinician: Referring Provider: Zenon Mayo Treating Provider/Extender: Skipper Cliche in Treatment: 12 Verbal / Phone Orders: No Diagnosis  Coding ICD-10 Coding Code Description E83.59 Other disorders of calcium metabolism L97.118 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 L97.128 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 L89.610 Pressure ulcer of right heel, unstageable E11.42 Type 2 diabetes mellitus with diabetic polyneuropathy L89.313 Pressure ulcer of right buttock, stage 3 L89.323 Pressure ulcer of left buttock, stage 3 Follow-up Appointments o Return Appointment in 2 weeks. [place]: - [facility] [place] for wound care. May utilize formulary equivalent dressing for wound treatment orders unless otherwise specified. Home Health Nurse may visit PRN to address patientos wound care needs. - 3 x a week Negative Pressure Wound Therapy Wound #1 Right,Medial Upper Leg o Wound VAC settings at 172mHg continuous pressure. Use foam to wound cavity. Please order WHITE foam to fill any tunnel/s and/or undermining when necessary. Change VAC dressing 3 X WEEK. Change canister as indicated when full. o Home Health Nurse may d/c VAC for s/s of increased infection, significant wound regression, or uncontrolled drainage. NDeliaat [phone] - MUST NOTIFY THIS CLINIC ASAP Wound #2 Left,Medial Upper Leg o Wound VAC settings at 1243mg continuous pressure. Use foam to wound cavity. Please order WHITE foam to fill any tunnel/s and/or undermining when necessary. Change VAC dressing 3 X WEEK. Change canister as indicated when full. o Home Health Nurse may d/c VAC for s/s of increased infection, significant wound regression, or uncontrolled drainage. Notify  [facility] at [phone]. - MUST NOTIFY THIS CLINIC ASAP Wound #3 Left,Lateral Upper Leg o Wound VAC settings at 157mHg continuous pressure. Use foam to wound cavity. Please order WHITE foam to fill any tunnel/s and/or undermining when  necessary. Change VAC dressing 3 X WEEK. Change canister as indicated when full. o Home Health Nurse may d/c VAC for s/s of increased infection, significant wound regression, or uncontrolled drainage. NH[facility]at [phone]. - MUST NOTIFY THIS CLINIC ASAP Wound Treatment Wound #10 - Calf Wound Laterality: Right, Lateral Topical: Betadine (Home Health) (Generic) 3 x Per Week/30 Days Wound #4 - Calcaneus Wound Laterality: Right Topical: Betadine (Home Health) (Generic) 3 x Per Week/30 Days Discharge Instructions: apply to eascar Primary Dressing: Silvercel Small 2x2 (in/in) (Home Health) 3 x Per Week/30 Days Discharge Instructions: open areas Secondary Dressing: Mepilex Border Flex, 4x4 (in/in) (Home Health) (Generic) 3 x Per Week/30 Days Discharge Instructions: Apply to wound as directed. Do not cut. Wound #6 - Gluteus Wound Laterality: Right Peri-Wound Care: Desitin Maximum Strength Ointment 4 (oz) (Home Health) (Generic) 3 x Per Week/30 Days MVERONIKA, LORIO([id number] Peri-Wound Care: Skin Prep (Home Health) (Generic) 3 x Per Week/30 Days Discharge Instructions: Use skin prep as directed Primary Dressing: Silvercel Small 2x2 (in/in) (Home Health) (Generic) 3 x Per Week/30 Days Discharge Instructions: Apply Silvercel Small 2x2 (in/in) as instructed Secondary Dressing: Mepilex Border Flex, 4x4 (in/in) (Home Health) (Generic) 3 x Per Week/30 Days Discharge Instructions: Apply to wound as directed. Do not cut. Wound #8 - Gluteus Wound Laterality: Left Primary Dressing: Silvercel Small 2x2 (in/in) (Home Health) (Generic) 3 x Per Week/30 Days Discharge Instructions: Apply Silvercel Small 2x2 (in/in) as instructed Wound #9 - Calf Wound Laterality: Left, Lateral Topical: Betadine (Home Health) 3 x Per Week/30 Days Electronic Signature(s) Signed: 02/26/2021 5:24:00 PM By: SWorthy KeelerPA-C Signed: 03/01/2021 7:55:29 AM By: ECarlene CoriaRN Entered By: ECarlene Coriaon  02/26/2021 13:26:48 MFawn Moses([id number] -------------------------------------------------------------------------------- Problem List Details Patient Name: MFawn KirkDate of Service: 02/26/2021 9:00 AM Medical Record Number: [id number] Account Number: [id number] of Birth/Sex: 111-Mar-1972(49 y.o. F) Treating RN: ECarlene CoriaPrimary Care Provider: RZenon MayoOther Clinician: Referring Provider: RZenon MayoTreating Provider/Extender: SSkipper Clichein Treatment: 12 Active Problems ICD-10 Encounter Code Description Active Date MDM Diagnosis E83.59 Other disorders of calcium metabolism 12/02/2020 No Yes L97.118 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 12/02/2020 No Yes L97.128 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 12/02/2020 No Yes L89.610 Pressure ulcer of right heel, unstageable 12/02/2020 No Yes E11.42 Type 2 diabetes mellitus with diabetic polyneuropathy 12/02/2020 No Yes L89.313 Pressure ulcer of right buttock, stage 3 01/19/2021 No Yes L89.323 Pressure ulcer of left buttock, stage 3 01/19/2021 No Yes Inactive Problems Resolved Problems Electronic Signature(s) Signed: 02/26/2021 9:37:41 AM By: SWorthy KeelerPA-C Entered By: SWorthy Keeleron 02/26/2021 09:37:41 MFawn Moses([id number] -------------------------------------------------------------------------------- Progress Note Details Patient Name: MFawn KirkDate of Service: 02/26/2021 9:00 AM Medical Record Number: [id number] Account Number: [id number] of Birth/Sex: [birth year](49 y.o. F) Treating RN: ECarlene CoriaPrimary Care Provider: RZenon MayoOther Clinician: Referring Provider: RZenon MayoTreating Provider/Extender: SSkipper Clichein Treatment: 12 Subjective Chief Complaint Information obtained from Patient 12/02/2020; patient is here for review of extensive wounds on her bilateral thighs  as well as an area on her right plantar heel History of Present Illness (HPI) ADMISSION 12/02/2020 This is a 50year old  woman who is a type II diabetic. Although there are mentions of type 1 diabetes in epic clearly this woman is type II based on the fact that she was on oral agents for 10 years before starting insulin. She also is in chronic renal failure and recently started on dialysis I think in November. She relates her problems starting in September she started to develop skin excoriation on her bilateral inner thighs. She thought this was a friction phenomenon however the tissue wrist can progressively broke down. She was seen by her primary doctor on 10/21/2020 noted to have erythema on both thighs medially and a blister. MRIs were ordered and she was put on Silvadene and gauze. She was seen in the ER on 12/12 at the [place] clinic also noted to have a right unstageable heel wound. 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 She dialyzes at [facility] in [facility] and she was started on sodium thiosulfate as far as the notes state. The patient states she gets something at dialysis every day although she has not exactly sure what they are giving her. She was noted by vein and vascular in [place] to have multiple open wounds on 11/19/2020 using Xeroform gauze. She had an angiogram booked by Dr. Oneida Alar predominantly I think because of the right heel ulcer although this was canceled by the patient because of the weather. The patient has large necrotic wounds on both inner thighs with covering black eschar. There is also an area on the left lateral thigh and an eschared area on her right plantar heel. She has been using Betadine to the right heel Xeroform to the areas on her legs. The patient has Medicaid but is able to get the Xeroform, were not really sure how she is managing this although she lives in [place] and there may be different  rules for Medicaid in [place] versus [place]. We certainly would not be able to get that here. 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 I wondered whether her neuropathy is particularly affected her sensation of pain since her recent left patella fracture does not seem to have been that painful either Past medical history includes stage V chronic renal failure starting on dialysis I think in November, clearly type 2 diabetes with neuropathy, hypertension, glaucoma, vitamin D deficiency, history of cholecystectomy, edema of both legs, hypothyroidism, she dialyzes Tuesday Thursday and Saturday at [facility] in [place] The patient had arterial studies on 11/19/2020 I think at vein and vascular in [place]. She had biphasic waveforms throughout the thigh on the right and at the popliteal proximally and distally in the ATA but the PTA and peroneal were not visualized. On the left again biphasic waveforms up to the level of the distal popliteal but not visualized in the tibial vessels. She was felt to have adequate flow where visualized. As noted she was supposed to have an angiogram which I think is certainly indicated [id number] upon evaluation today patient actually appears to be doing okay in regard to her wounds. This is actually the first time of seeing her she saw Dr. Dellia Nims at the last visit she does have quite an extensive history based on review. With that being said I do not see any signs right now of active infection which is great news. Overall I am extremely pleased with where things stand in that regard. With that being said  I do not believe the Xeroform is doing much for the calciphylaxis areas on her thighs and lateral or medial locations. I really feel like she may do better with Dakin's moistened gauze. I do think that we can see about ordering the Dakin's for her she is already using gauze  and then wrapping with roll gauze anyway so really would not change much except for moistening some gauze and applying it to the wound bed. With that being said I do not see any signs of active infection at this time which is great news. The patient all in all seems to be in fairly good spirits all things considered. 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 Fortunately it looks like some of the eschar is loosening up so we should be able to get this removed which hopefully will help in speeding up the healing process. With that being said with regard to the patient's gluteal region she unfortunately has new pressure ulcerations open today which I think could benefit from possibly having a air mattress. With that being said I explained to the patient that unfortunately this could still take some time as far as getting it to heal completely. There does not appear to be any signs of active infection at this time which is great news. No fevers, chills, nausea, vomiting, or diarrhea. 02/16/2021 upon evaluation today patient appears to be doing about the same in regard to her heel. Her gluteal area is actually getting better. She did have surgery in regard to her thighs bilaterally as well as her knee. It was not until she got into the operating room that it was noted that she had the wounds on the thighs. Subsequently according to the surgeons note dated 02/01/2021 they contacted the patient's daughter in order to discuss the fact they felt she needed to have the wound surgically debrided and a wound VAC placed and subsequently instead of patellar reconstruction they opted to proceed with removal of the Portion of the patella in order to get this area to heal more effectively and quickly. Overall that seems to have done quite well. 02/26/2021 upon evaluation today patient's wounds  actually appear to be doing excellent pretty much across the board I am very pleased. With that being said the wounds where she has the wound vacs placed on the thighs in particular seems to be doing a very good job as far as healing is concerned. There does not appear to be any evidence of infection which is great news and overall I am extremely pleased in that regard. No fevers, chills, nausea, vomiting, or diarrhea. Amy Moses, Amy Moses ([id number]) Objective Constitutional Well-nourished and well-hydrated in no acute distress. Vitals Time Taken: 8:57 AM, Height: 66 in, Weight: 235 lbs, BMI: 37.9, Temperature: 97.9 F, Pulse: 69 bpm, Respiratory Rate: 18 breaths/min, Blood Pressure: 155/101 mmHg. Respiratory normal breathing without difficulty. Psychiatric this patient is able to make decisions and demonstrates good insight into disease process. Alert and Oriented x 3. pleasant and cooperative. General Notes: Patient's wound bed did not require any sharp debridement at any location today that necessitated being performed. I think that in general she seems to be making good progress and even the areas over the thighs I think is doing quite well. I do not see anything of concern although I think we may want to avoid using the Adaptic to the wounds themselves I think  just using the granular foam would be appropriate and then subsequently that will also help clean away some of the necrotic debris noted as well. As for regard to the legs and the heel I think we will keep the dressings the same and no debridement was performed today. Integumentary (Hair, Skin) Wound #1 status is Open. Original cause of wound was Gradually Appeared. The date acquired was: 07/22/2020. The wound has been in treatment 12 weeks. The wound is located on the Right,Medial Upper Leg. The wound measures 19cm length x 20cm width x 1.3cm depth; 298.451cm^2 area and 387.987cm^3 volume. There is Fat Layer (Subcutaneous Tissue)  exposed. There is no tunneling or undermining noted. There is a large amount of serosanguineous drainage noted. There is medium (34-66%) red granulation within the wound bed. There is a medium (34-66%) amount of necrotic tissue within the wound bed including Eschar and Adherent Slough. Wound #10 status is Open. Original cause of wound was Gradually Appeared. The date acquired was: 02/16/2021. The wound has been in treatment 1 weeks. The wound is located on the Right,Lateral Calf. The wound measures 2cm length x 1cm width x 0.1cm depth; 1.571cm^2 area and 0.157cm^3 volume. There is no tunneling or undermining noted. There is a none present amount of drainage noted. There is no granulation within the wound bed. There is a large (67-100%) amount of necrotic tissue within the wound bed including Eschar. Wound #2 status is Open. Original cause of wound was Gradually Appeared. The date acquired was: 07/22/2020. The wound has been in treatment 12 weeks. The wound is located on the Left,Medial Upper Leg. The wound measures 16cm length x 8cm width x 1cm depth; 100.531cm^2 area and 100.531cm^3 volume. There is Fat Layer (Subcutaneous Tissue) exposed. There is no tunneling or undermining noted. There is a large amount of serosanguineous drainage noted. There is medium (34-66%) red granulation within the wound bed. There is a medium (34-66%) amount of necrotic tissue within the wound bed including Eschar and Adherent Slough. Wound #3 status is Open. Original cause of wound was Gradually Appeared. The date acquired was: 07/22/2020. The wound has been in treatment 12 weeks. The wound is located on the Left,Lateral Upper Leg. The wound measures 4cm length x 14cm width x 0.4cm depth; 43.982cm^2 area and 17.593cm^3 volume. There is Fat Layer (Subcutaneous Tissue) exposed. There is no tunneling or undermining noted. There is a large amount of serosanguineous drainage noted. There is medium (34-66%) red granulation within  the wound bed. There is a medium (34-66%) amount of necrotic tissue within the wound bed including Eschar and Adherent Slough. Wound #4 status is Open. Original cause of wound was Gradually Appeared. The date acquired was: 10/21/2020. The wound has been in treatment 12 weeks. The wound is located on the Right Calcaneus. The wound measures 6.2cm length x 6cm width x 0.4cm depth; 29.217cm^2 area and 11.687cm^3 volume. There is Fat Layer (Subcutaneous Tissue) exposed. There is no tunneling or undermining noted. There is a medium amount of serosanguineous drainage noted. There is small (1-33%) pink granulation within the wound bed. There is a large (67-100%) amount of necrotic tissue within the wound bed including Eschar and Adherent Slough. Wound #5 status is Healed - Epithelialized. Original cause of wound was Gradually Appeared. The date acquired was: 12/22/2020. The wound has been in treatment 6 weeks. The wound is located on the Right,Posterior Upper Leg. The wound measures 0cm length x 0cm width x 0cm depth; 0cm^2 area and 0cm^3 volume. Wound #6 status  is Open. Original cause of wound was Gradually Appeared. The date acquired was: 12/22/2020. The wound has been in treatment 6 weeks. The wound is located on the Right Gluteus. The wound measures 8cm length x 3cm width x 0.1cm depth; 18.85cm^2 area and 1.885cm^3 volume. There is Fat Layer (Subcutaneous Tissue) exposed. There is no tunneling or undermining noted. There is a medium amount of serosanguineous drainage noted. There is large (67-100%) red, friable granulation within the wound bed. There is a small (1-33%) amount of necrotic tissue within the wound bed including Adherent Slough. Wound #7 status is Healed - Epithelialized. Original cause of wound was Gradually Appeared. The date acquired was: 12/22/2020. The wound has been in treatment 6 weeks. The wound is located on the Left Gluteal fold. The wound measures 0cm length x 0cm width x 0cm depth;  0cm^2 area and 0cm^3 volume. Wound #8 status is Open. Original cause of wound was Gradually Appeared. The date acquired was: 12/22/2020. The wound has been in treatment 6 weeks. The wound is located on the Left Gluteus. The wound measures 2.3cm length x 0.9cm width x 0.1cm depth; 1.626cm^2 area and 0.163cm^3 volume. There is Fat Layer (Subcutaneous Tissue) exposed. There is no tunneling or undermining noted. There is a medium amount of serosanguineous drainage noted. There is large (67-100%) red, pink granulation within the wound bed. There is a small (1-33%) amount of necrotic tissue within the wound bed including Adherent Slough. Amy, Moses ([id number]) Wound #9 status is Open. Original cause of wound was Gradually Appeared. The date acquired was: 02/16/2021. The wound has been in treatment 1 weeks. The wound is located on the Left,Lateral Calf. The wound measures 4cm length x 2.3cm width x 0.1cm depth; 7.226cm^2 area and 0.723cm^3 volume. There is no tunneling or undermining noted. There is a none present amount of drainage noted. There is no granulation within the wound bed. There is a large (67-100%) amount of necrotic tissue within the wound bed including Eschar. 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 Home Health: [facility]: - [facility] [place] for wound care. May utilize formulary equivalent dressing for wound treatment orders unless otherwise specified. Home Health Nurse may visit [facility] to address patient s wound care needs. - 3 x a week Negative  Pressure Wound Therapy: Wound #1 Right,Medial Upper Leg: Wound VAC settings at 174mHg continuous pressure. Use foam to wound cavity. Please order WHITE foam to fill any tunnel/s and/or undermining when necessary. Change VAC dressing 3 X WEEK. Change canister as indicated when full. Home Health Nurse may d/c VAC for s/s of increased infection, significant wound regression, or uncontrolled drainage. [place] [phone] - MUST NOTIFY THIS CLINIC ASAP Wound #2 Left,Medial Upper Leg: Wound VAC settings at 1242mg continuous pressure. Use foam to wound cavity. Please order WHITE foam to fill any tunnel/s and/or undermining when necessary. Change VAC dressing 3 X WEEK. Change canister as indicated when full. Home Health Nurse may d/c VAC for s/s of increased infection, significant wound regression, or uncontrolled drainage. [place] [id number]- MUST NOTIFY THIS CLINIC ASAP Wound #3 Left,Lateral Upper Leg: Wound VAC settings  at 129mHg continuous pressure. Use foam to wound cavity. Please order WHITE foam to fill any tunnel/s and/or undermining when necessary. Change VAC dressing 3 X WEEK. Change canister as indicated when full. Home Health Nurse may d/c VAC for s/s of increased infection, significant wound regression, or uncontrolled drainage. [place] [phone]. - MUST NOTIFY THIS CLINIC ASAP WOUND #10: - Calf Wound Laterality: Right, Lateral Topical: Betadine (Home Health) (Generic) 3 x Per Week/30 Days WOUND #4: - Calcaneus Wound Laterality: Right Topical: Betadine (Home Health) (Generic) 3 x Per Week/30 Days Discharge Instructions: apply to eascar Primary Dressing: Silvercel Small 2x2 (in/in) (Home Health) 3 x Per Week/30 Days Discharge Instructions: open areas Secondary Dressing: Mepilex Border Flex, 4x4 (in/in) (Home Health) (Generic) 3 x Per Week/30 Days Discharge Instructions: Apply to wound as directed. Do not  cut. WOUND #6: - Gluteus Wound Laterality: Right Peri-Wound Care: Desitin Maximum Strength Ointment 4 (oz) (Home Health) (Generic) 3 x Per Week/30 Days Peri-Wound Care: Skin Prep (Home Health) (Generic) 3 x Per Week/30 Days Discharge Instructions: Use skin prep as directed Primary Dressing: Silvercel Small 2x2 (in/in) (Home Health) (Generic) 3 x Per Week/30 Days Discharge Instructions: Apply Silvercel Small 2x2 (in/in) as instructed Secondary Dressing: Mepilex Border Flex, 4x4 (in/in) (Home Health) (Generic) 3 x Per Week/30 Days Discharge Instructions: Apply to wound as directed. Do not cut. WOUND #8: - Gluteus Wound Laterality: Left Primary Dressing: Silvercel Small 2x2 (in/in) (Home Health) (Generic) 3 x Per Week/30 Days Discharge Instructions: Apply Silvercel Small 2x2 (in/in) as instructed WOUND #9: - Calf Wound Laterality: Left, Lateral Topical: Betadine ([place] 3 x Per Week/30 Days [place], Colbi ([id number] 1. Would recommend currently that we going to continue with the wound care measures as before and the patient is in agreement with that plan this includes the use of the wound vacs for the legs bilaterally. I think that is a good way to go and to be honest seems to be doing a great job at helping the wounds feeling. 2. I am also can recommend that we continue with the Betadine to the lateral legs bilaterally as well as the right heel although also can use alginate over the open area of the right heel I think that is doing a great job. 3. I am also can recommend continued and appropriate offloading I think that still of utmost importance. We will see patient back for reevaluation in 2 weeks here in the clinic. If anything worsens or changes patient will contact our office for additional recommendations. Electronic Signature(s) Signed: 02/26/2021 5:23:28 PM By: SWorthy KeelerPA-C Entered By: SWorthy Keeleron 02/26/2021 17:23:27 MFawn Moses ([id number] -------------------------------------------------------------------------------- SuperBill Details Patient Name: MFawn KirkDate of Service: 02/26/2021 Medical Record Number: [id number] Account Number: [id number] of Birth/Sex: 1May 19, 1972(49 y.o. 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 L89.610 Pressure ulcer of right heel, unstageable E11.42 Type 2 diabetes mellitus with diabetic polyneuropathy L89.313 Pressure ulcer of right buttock, stage 3 L89.323 Pressure ulcer of left buttock, stage 3 Facility Procedures CPT4 Code: [id number]: [id number] WOUND CARE VISIT-LEV 5 EST PT Modifier: Quantity: 1 Physician Procedures CPT4 Code: [id number]: [id number] WC PHYS LEVEL  4 - EST PT Modifier: Quantity: 1 CPT4 Code: Description: ICD-10 Diagnosis Description E83.59 Other disorders of calcium metabolism L97.118 Non-pressure chronic ulcer of right thigh with other specified severit L97.128 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

## 2021-02-26 NOTE — Progress Notes (Signed)
LITSA, CURE ([id number]) Visit Report for 02/16/2021 Arrival Information Details Patient Name: JAELIN, ZAMOR Date of Service: 02/16/2021 10:45 AM Medical Record Number: [id number] Patient Account Number: [id number] Date of Birth/Sex: 1971/08/09 (50 y.o. F) Treating RN: Dolan Amen Primary Care Norbert Malkin: Zenon Mayo Other Clinician: Jeanine Luz Referring Cordie Beazley: Zenon Mayo Treating Blanchie Zeleznik/Extender: Skipper Cliche in Treatment: 10 Visit Information History Since Last Visit Added or deleted any medications: Yes Patient Arrived: Other Had a fall or experienced change in No Arrival Time: 10:54 activities of daily living that may affect Accompanied By: self risk of falls: Transfer Assistance: McMullen since last visit: Yes Patient Identification Verified: Yes Pain Present Now: No Secondary Verification Process Completed: Yes Patient Requires Transmission-Based Precautions: No Patient Has Alerts: No Electronic Signature(s) Signed: 02/17/2021 4:51:14 PM By: Jeanine Luz Entered By: Jeanine Luz on 02/16/2021 10:59:53 [name] ([id number]) -------------------------------------------------------------------------------- Clinic Level of Care Assessment Details Patient Name: [name] Date of Service: 02/16/2021 10:45 AM Medical Record Number: [id number] Patient Account Number: [id number] Date of Birth/Sex: [birth year] (49 y.o. F) Treating RN: Dolan Amen Primary Care Ayden Apodaca: Zenon Mayo Other Clinician: Jeanine Luz Referring Kahleb Mcclane: Zenon Mayo Treating Adhya Cocco/Extender: Skipper Cliche in Treatment: 10 Clinic Level of Care Assessment Items TOOL 4 Quantity Score X - Use when only an EandM is performed on FOLLOW-UP visit 1 0 ASSESSMENTS - Nursing Assessment / Reassessment X - Reassessment of Co-morbidities (includes updates in patient status) 1 10 X- 1 5 Reassessment of Adherence to Treatment  Plan ASSESSMENTS - Wound and Skin Assessment / Reassessment '[]'$  - Simple Wound Assessment / Reassessment - one wound 0 X- 10 5 Complex Wound Assessment / Reassessment - multiple wounds '[]'$  - 0 Dermatologic / Skin Assessment (not related to wound area) ASSESSMENTS - Focused Assessment '[]'$  - Circumferential Edema Measurements - multi extremities 0 '[]'$  - 0 Nutritional Assessment / Counseling / Intervention '[]'$  - 0 Lower Extremity Assessment (monofilament, tuning fork, pulses) '[]'$  - 0 Peripheral Arterial Disease Assessment (using hand held doppler) ASSESSMENTS - Ostomy and/or Continence Assessment and Care '[]'$  - Incontinence Assessment and Management 0 '[]'$  - 0 Ostomy Care Assessment and Management (repouching, etc.) PROCESS - Coordination of Care '[]'$  - Simple Patient / Family Education for ongoing care 0 X- 1 20 Complex (extensive) Patient / Family Education for ongoing care X- 1 10 Staff obtains Consents, Records, Test Results / Process Orders '[]'$  - 0 Staff telephones HHA, Nursing Homes / Clarify orders / etc '[]'$  - 0 Routine Transfer to another Facility (non-emergent condition) '[]'$  - 0 Routine Hospital Admission (non-emergent condition) '[]'$  - 0 New Admissions / Biomedical engineer / Ordering NPWT, Apligraf, etc. '[]'$  - 0 Emergency Hospital Admission (emergent condition) '[]'$  - 0 Simple Discharge Coordination X- 1 15 Complex (extensive) Discharge Coordination PROCESS - Special Needs '[]'$  - Pediatric / Minor Patient Management 0 '[]'$  - 0 Isolation Patient Management '[]'$  - 0 Hearing / Language / Visual special needs '[]'$  - 0 Assessment of Community assistance (transportation, D/C planning, etc.) '[]'$  - 0 Additional assistance / Altered mentation '[]'$  - 0 Support Surface(s) Assessment (bed, cushion, seat, etc.) INTERVENTIONS - Wound Cleansing / Measurement Billinger, Brelynn ([id number]) '[]'$  - 0 Simple Wound Cleansing - one wound X- 10 5 Complex Wound Cleansing - multiple wounds X- 1 5 Wound  Imaging (photographs - any number of wounds) '[]'$  - 0 Wound Tracing (instead of photographs) '[]'$  - 0 Simple Wound Measurement - one wound X- 10 5 Complex Wound Measurement - multiple wounds INTERVENTIONS - Wound Dressings X -  Small Wound Dressing one or multiple wounds 5 10 X- 5 15 Medium Wound Dressing one or multiple wounds '[]'$  - 0 Large Wound Dressing one or multiple wounds X- 1 5 Application of Medications - topical '[]'$  - 0 Application of Medications - injection INTERVENTIONS - Miscellaneous '[]'$  - External ear exam 0 '[]'$  - 0 Specimen Collection (cultures, biopsies, blood, body fluids, etc.) '[]'$  - 0 Specimen(s) / Culture(s) sent or taken to Lab for analysis '[]'$  - 0 Patient Transfer (multiple staff / Civil Service fast streamer / Similar devices) '[]'$  - 0 Simple Staple / Suture removal (25 or less) '[]'$  - 0 Complex Staple / Suture removal (26 or more) '[]'$  - 0 Hypo / Hyperglycemic Management (close monitor of Blood Glucose) '[]'$  - 0 Ankle / Brachial Index (ABI) - do not check if billed separately X- 1 5 Vital Signs Has the patient been seen at the hospital within the last three years: Yes Total Score: 350 Level Of Care: New/Established - Level 5 Electronic Signature(s) Signed: 02/16/2021 5:07:41 PM By: Georges Mouse, Minus Breeding RN Entered By: Georges Mouse, Minus Breeding on 02/16/2021 12:27:14 [name] ([id number]) -------------------------------------------------------------------------------- Encounter Discharge Information Details Patient Name: [name] Date of Service: 02/16/2021 10:45 AM Medical Record Number: [id number] Patient Account Number: [id number] Date of Birth/Sex: 16-Sep-1971 (49 y.o. F) Treating RN: Dolan Amen Primary Care Lourine Alberico: Zenon Mayo Other Clinician: Jeanine Luz Referring Juriel Cid: Zenon Mayo Treating Aivy Akter/Extender: Skipper Cliche in Treatment: 10 Encounter Discharge Information Items Post Procedure Vitals Discharge Condition:  Stable Temperature (F): 98 Ambulatory Status: Stretcher Pulse (bpm): 66 Discharge Destination: Home Respiratory Rate (breaths/min): 18 Transportation: Private Auto Blood Pressure (mmHg): 150/89 Accompanied By: EMS Schedule Follow-up Appointment: Yes Clinical Summary of Care: Electronic Signature(s) Signed: 02/16/2021 1:08:50 PM By: Georges Mouse, Minus Breeding RN Entered By: Georges Mouse, Minus Breeding on 02/16/2021 13:08:50 [name] ([id number]) -------------------------------------------------------------------------------- Lower Extremity Assessment Details Patient Name: [name] Date of Service: 02/16/2021 10:45 AM Medical Record Number: [id number] Patient Account Number: [id number] Date of Birth/Sex: 01/01/1971 (49 y.o. F) Treating RN: Carlene Coria Primary Care Sacora Hawbaker: Zenon Mayo Other Clinician: Jeanine Luz Referring Donetta Isaza: Zenon Mayo Treating Arham Symmonds/Extender: Skipper Cliche in Treatment: 10 Vascular Assessment Pulses: Dorsalis Pedis Palpable: [Left:Yes] [Right:Yes] Electronic Signature(s) Signed: 02/26/2021 8:11:40 AM By: Carlene Coria RN Entered By: Carlene Coria on 02/16/2021 11:26:15 [name] ([id number]) -------------------------------------------------------------------------------- Multi Wound Chart Details Patient Name: [name] Date of Service: 02/16/2021 10:45 AM Medical Record Number: [id number] Patient Account Number: [id number] Date of Birth/Sex: Apr 22, 1971 (50 y.o. F) Treating RN: Dolan Amen Primary Care Zanasia Hickson: Zenon Mayo Other Clinician: Jeanine Luz Referring Patsey Pitstick: Zenon Mayo Treating Nazario Russom/Extender: Skipper Cliche in Treatment: 10 Vital Signs Height(in): 21 Pulse(bpm): 59 Weight(lbs): 235 Blood Pressure(mmHg): 150/89 Body Mass Index(BMI): 38 Temperature(F): 98.0 Respiratory Rate(breaths/min): 18 Photos: Wound Location: Right, Lateral Calf Right Calcaneus Left, Lateral  Calf Wounding Event: Gradually Appeared Gradually Appeared Gradually Appeared Primary Etiology: Calciphylaxis Pressure Ulcer Calciphylaxis Comorbid History: Cataracts, Glaucoma, Anemia, Cataracts, Glaucoma, Anemia, Cataracts, Glaucoma, Anemia, Hypertension, Type II Diabetes, End Hypertension, Type II Diabetes, End Hypertension, Type II Diabetes, End Stage Renal Disease, Neuropathy Stage Renal Disease, Neuropathy Stage Renal Disease, Neuropathy Date Acquired: 02/16/2021 10/21/2020 02/16/2021 Weeks of Treatment: 0 10 0 Wound Status: Open Open Open Measurements L x W x D (cm) 2.5x1.1x0.1 7.5x6.5x0.4 4x2.2x0.1 Area (cm) : 2.16 38.288 6.912 Volume (cm) : 0.216 15.315 0.691 % Reduction in Area: 0.00% -35.40% 0.00% % Reduction in Volume: 0.00% -441.70% 0.00% Classification: Unclassifiable Unstageable/Unclassified Unclassifiable Exudate Amount: None Present Small None Present Exudate Type:  N/A Serosanguineous N/A Exudate Color: N/A red, brown N/A Granulation Amount: None Present (0%) None Present (0%) None Present (0%) Necrotic Amount: Large (67-100%) Large (67-100%) Large (67-100%) Necrotic Tissue: Eschar Eschar Eschar Exposed Structures: Fascia: No Fascia: No Fascia: No Fat Layer (Subcutaneous Tissue): Fat Layer (Subcutaneous Tissue): Fat Layer (Subcutaneous Tissue): No No No Tendon: No Tendon: No Tendon: No Muscle: No Muscle: No Muscle: No Joint: No Joint: No Joint: No Bone: No Bone: No Bone: No Epithelialization: None None None Treatment Notes Electronic Signature(s) Signed: 02/16/2021 5:07:41 PM By: Georges Mouse, Minus Breeding RN Entered By: Georges Mouse, Minus Breeding on 02/16/2021 12:00:05 [name] ([id number]) -------------------------------------------------------------------------------- Multi-Disciplinary Care Plan Details Patient Name: [name] Date of Service: 02/16/2021 10:45 AM Medical Record Number: [id number] Patient Account Number: [id number] Date of  Birth/Sex: [birth year] (49 y.o. F) Treating RN: Dolan Amen Primary Care Tajanay Hurley: Zenon Mayo Other Clinician: Jeanine Luz Referring Marq Rebello: Zenon Mayo Treating Danyele Smejkal/Extender: Skipper Cliche in Treatment: 10 Active Inactive Wound/Skin Impairment Nursing Diagnoses: Impaired tissue integrity Goals: Patient/caregiver will verbalize understanding of skin care regimen Date Initiated: 12/02/2020 Target Resolution Date: 01/30/2021 Goal Status: Active Ulcer/skin breakdown will have a volume reduction of 30% by week 4 Date Initiated: 12/02/2020 Date Inactivated: 01/15/2021 Target Resolution Date: 01/02/2021 Goal Status: Unmet Unmet Reason: comorbities Ulcer/skin breakdown will have a volume reduction of 50% by week 8 Date Initiated: 01/15/2021 Target Resolution Date: 01/30/2021 Goal Status: Active Interventions: Assess ulceration(s) every visit Treatment Activities: Referred to DME Rhegan Trunnell for dressing supplies : 12/02/2020 Skin care regimen initiated : 12/02/2020 Topical wound management initiated : 12/02/2020 Notes: Electronic Signature(s) Signed: 02/16/2021 5:07:41 PM By: Georges Mouse, Minus Breeding RN Entered By: Georges Mouse, Minus Breeding on 02/16/2021 11:59:58 [name] ([id number]) -------------------------------------------------------------------------------- Pain Assessment Details Patient Name: [name] Date of Service: 02/16/2021 10:45 AM Medical Record Number: [id number] Patient Account Number: [id number] Date of Birth/Sex: 1971/03/09 (50 y.o. F) Treating RN: Dolan Amen Primary Care Chauntae Hults: Zenon Mayo Other Clinician: Jeanine Luz Referring Ramonita Koenig: Zenon Mayo Treating Marlyn Rabine/Extender: Skipper Cliche in Treatment: 10 Active Problems Location of Pain Severity and Description of Pain Patient Has Paino No Site Locations Rate the pain. Current Pain Level: 0 Pain Management and Medication Current Pain  Management: Electronic Signature(s) Signed: 02/16/2021 5:07:41 PM By: Georges Mouse, Minus Breeding RN Signed: 02/17/2021 4:51:14 PM By: Jeanine Luz Entered By: Jeanine Luz on 02/16/2021 11:00:28 [name] ([id number]) -------------------------------------------------------------------------------- Patient/Caregiver Education Details Patient Name: [name] Date of Service: 02/16/2021 10:45 AM Medical Record Number: [id number] Patient Account Number: [id number] Date of Birth/Gender: [birth year] (50 y.o. F) Treating RN: Dolan Amen Primary Care Physician: Zenon Mayo Other Clinician: Jeanine Luz Referring Physician: Zenon Mayo Treating Physician/Extender: Skipper Cliche in Treatment: 10 Education Assessment Education Provided To: Patient Education Topics Provided Wound/Skin Impairment: Methods: Explain/Verbal Responses: State content correctly Electronic Signature(s) Signed: 02/16/2021 5:07:41 PM By: Georges Mouse, Minus Breeding RN Entered By: Georges Mouse, Minus Breeding on 02/16/2021 12:27:33 [name] ([id number]) -------------------------------------------------------------------------------- Wound Assessment Details Patient Name: [name] Date of Service: 02/16/2021 10:45 AM Medical Record Number: [id number] Patient Account Number: [id number] Date of Birth/Sex: August 28, 1971 (49 y.o. F) Treating RN: Dolan Amen Primary Care Zynasia Burklow: Zenon Mayo Other Clinician: Jeanine Luz Referring Shunta Mclaurin: Zenon Mayo Treating Cinde Ebert/Extender: Skipper Cliche in Treatment: 10 Wound Status Wound Number: 10 Primary Calciphylaxis Etiology: Wound Location: Right, Lateral Calf Wound Open Wounding Event: Gradually Appeared Status: Date Acquired: 02/16/2021 Comorbid Cataracts, Glaucoma, Anemia, Hypertension, Type II Weeks Of Treatment: 0 History: Diabetes, End Stage Renal Disease, Neuropathy Clustered Wound: No Photos Wound  Measurements Length: (cm) 2.5 % Redu Width: (cm) 1.1 % Redu Depth: (cm) 0.1 Epithe Area: (cm) 2.16 Tunne Volume: (cm) 0.216 Under ction in Area: 0% ction in Volume: 0% lialization: None ling: No mining: No Wound Description Classification: Unclassifiable Foul Exudate Amount: None Present Sloug Odor After Cleansing: No h/Fibrino No Wound Bed Granulation Amount: None Present (0%) Exposed Structure Necrotic Amount: Large (67-100%) Fascia Exposed: No Necrotic Quality: Eschar Fat Layer (Subcutaneous Tissue) Exposed: No Tendon Exposed: No Muscle Exposed: No Joint Exposed: No Bone Exposed: No Treatment Notes Wound #10 (Calf) Wound Laterality: Right, Lateral Cleanser Peri-Wound Care Topical Betadine Primary Dressing ETHIE, YANNI ([id number]) Secondary Dressing Secured With Compression Wrap Compression Stockings Add-Ons Electronic Signature(s) Signed: 02/16/2021 5:07:41 PM By: Georges Mouse, Minus Breeding RN Entered By: Georges Mouse, Minus Breeding on 02/16/2021 11:58:55 [name] ([id number]) -------------------------------------------------------------------------------- Wound Assessment Details Patient Name: [name] Date of Service: 02/16/2021 10:45 AM Medical Record Number: [id number] Patient Account Number: [id number] Date of Birth/Sex: 10/19/1971 (50 y.o. F) Treating RN: Carlene Coria Primary Care Zellie Jenning: Zenon Mayo Other Clinician: Jeanine Luz Referring Marrion Accomando: Zenon Mayo Treating Daylah Sayavong/Extender: Skipper Cliche in Treatment: 10 Wound Status Wound Number: 4 Primary Pressure Ulcer Etiology: Wound Location: Right Calcaneus Wound Open Wounding Event: Gradually Appeared Status: Date Acquired: 10/21/2020 Comorbid Cataracts, Glaucoma, Anemia, Hypertension, Type II Weeks Of Treatment: 10 History: Diabetes, End Stage Renal Disease, Neuropathy Clustered Wound: No Photos Wound Measurements Length: (cm) 7.5 % Reducti Width: (cm) 6.5 %  Reducti Depth: (cm) 0.4 Epithelia Area: (cm) 38.288 Tunnelin Volume: (cm) 15.315 Undermin on in Area: -35.4% on in Volume: -441.7% lization: None g: No ing: No Wound Description Classification: Unstageable/Unclassified Foul Odo Exudate Amount: Small Slough/F Exudate Type: Serosanguineous Exudate Color: red, brown r After Cleansing: No ibrino Yes Wound Bed Granulation Amount: None Present (0%) Exposed Structure Necrotic Amount: Large (67-100%) Fascia Exposed: No Necrotic Quality: Eschar Fat Layer (Subcutaneous Tissue) Exposed: No Tendon Exposed: No Muscle Exposed: No Joint Exposed: No Bone Exposed: No Treatment Notes Wound #4 (Calcaneus) Wound Laterality: Right Cleanser Peri-Wound Care Topical Betadine AUNALEE, DEO ([id number]) Discharge Instruction: apply to eascar Primary Dressing Silvercel Small 2x2 (in/in) Discharge Instruction: open areas Secondary Dressing Mepilex Border Flex, 4x4 (in/in) Discharge Instruction: Apply to wound as directed. Do not cut. Secured With Compression Wrap Compression Stockings Add-Ons Electronic Signature(s) Signed: 02/26/2021 8:11:40 AM By: Carlene Coria RN Entered By: Carlene Coria on 02/16/2021 11:24:30 [name] ([id number]) -------------------------------------------------------------------------------- Wound Assessment Details Patient Name: [name] Date of Service: 02/16/2021 10:45 AM Medical Record Number: [id number] Patient Account Number: [id number] Date of Birth/Sex: [birth year] (49 y.o. F) Treating RN: Dolan Amen Primary Care Everson Mott: Zenon Mayo Other Clinician: Jeanine Luz Referring Jais Demir: Zenon Mayo Treating Deaundre Allston/Extender: Skipper Cliche in Treatment: 10 Wound Status Wound Number: 6 Primary Pressure Ulcer Etiology: Wound Location: Right Gluteus Wound Open Wounding Event: Gradually Appeared Status: Date Acquired: 12/22/2020 Comorbid Cataracts, Glaucoma, Anemia,  Hypertension, Type II Weeks Of Treatment: 4 History: Diabetes, End Stage Renal Disease, Neuropathy Clustered Wound: Yes Photos Wound Measurements Length: (cm) 7.5 Width: (cm) 6 Depth: (cm) 0.1 Area: (cm) 35.343 Volume: (cm) 3.534 % Reduction in Area: 43.8% % Reduction in Volume: 43.8% Epithelialization: Medium (34-66%) Tunneling: No Undermining: No Wound Description Classification: Category/Stage III Exudate Amount: Medium Exudate Type: Serosanguineous Exudate Color: red, brown Foul Odor After Cleansing: No Slough/Fibrino Yes Wound Bed Granulation Amount: Large (67-100%) Exposed Structure Granulation Quality: Red, Pink Fascia Exposed: No Necrotic Amount: Small (1-33%) Fat Layer (Subcutaneous Tissue) Exposed: Yes Necrotic Quality: Adherent Slough Tendon Exposed:  No Muscle Exposed: No Joint Exposed: No Bone Exposed: No Treatment Notes Wound #6 (Gluteus) Wound Laterality: Right Cleanser Peri-Wound Care Desitin Maximum Strength Ointment 4 (oz) Skin Prep Discharge Instruction: Use skin prep as directed AMISSA, MOLINE ([id number]) Topical Primary Dressing Silvercel Small 2x2 (in/in) Discharge Instruction: Apply Silvercel Small 2x2 (in/in) as instructed Secondary Dressing Mepilex Border Flex, 4x4 (in/in) Discharge Instruction: Apply to wound as directed. Do not cut. Secured With Compression Wrap Compression Stockings Environmental education officer) Signed: 02/16/2021 5:07:41 PM By: Georges Mouse, Minus Breeding RN Entered By: Georges Mouse, Minus Breeding on 02/16/2021 12:15:48 [name] ([id number]) -------------------------------------------------------------------------------- Wound Assessment Details Patient Name: [name] Date of Service: 02/16/2021 10:45 AM Medical Record Number: [id number] Patient Account Number: [id number] Date of Birth/Sex: 12/25/1970 (49 y.o. F) Treating RN: Dolan Amen Primary Care Ezio Wieck: Zenon Mayo Other Clinician:  Jeanine Luz Referring Marguerite Jarboe: Zenon Mayo Treating Tunya Held/Extender: Skipper Cliche in Treatment: 10 Wound Status Wound Number: 7 Primary Pressure Ulcer Etiology: Wound Location: Left Gluteal fold Wound Open Wounding Event: Gradually Appeared Status: Date Acquired: 12/22/2020 Comorbid Cataracts, Glaucoma, Anemia, Hypertension, Type II Weeks Of Treatment: 4 History: Diabetes, End Stage Renal Disease, Neuropathy Clustered Wound: No Photos Wound Measurements Length: (cm) 2.6 Width: (cm) 0.9 Depth: (cm) 0.1 Area: (cm) 1.838 Volume: (cm) 0.184 % Reduction in Area: 80.5% % Reduction in Volume: 80.5% Epithelialization: None Tunneling: No Undermining: No Wound Description Classification: Category/Stage III Exudate Amount: Medium Exudate Type: Serosanguineous Exudate Color: red, brown Foul Odor After Cleansing: No Slough/Fibrino Yes Wound Bed Granulation Amount: Small (1-33%) Exposed Structure Granulation Quality: Red, Pink Fascia Exposed: No Necrotic Amount: Large (67-100%) Fat Layer (Subcutaneous Tissue) Exposed: Yes Necrotic Quality: Eschar, Adherent Slough Tendon Exposed: No Muscle Exposed: No Joint Exposed: No Bone Exposed: No Treatment Notes Wound #7 (Gluteal fold) Wound Laterality: Left Cleanser Peri-Wound Care Desitin Maximum Strength Ointment 4 (oz) Skin Prep Discharge Instruction: Use skin prep as directed LAIGHLA, MCMURDIE ([id number]) Topical Primary Dressing Silvercel Small 2x2 (in/in) Discharge Instruction: Apply Silvercel Small 2x2 (in/in) as instructed Secondary Dressing Mepilex Border Flex, 4x4 (in/in) Discharge Instruction: Apply to wound as directed. Do not cut. Secured With Compression Wrap Compression Stockings Environmental education officer) Signed: 02/16/2021 5:07:41 PM By: Georges Mouse, Minus Breeding RN Entered By: Georges Mouse, Minus Breeding on 02/16/2021 12:16:28 [name]  ([id number]) -------------------------------------------------------------------------------- Wound Assessment Details Patient Name: [name] Date of Service: 02/16/2021 10:45 AM Medical Record Number: [id number] Patient Account Number: [id number] Date of Birth/Sex: [birth year] (49 y.o. F) Treating RN: Dolan Amen Primary Care Lorie Melichar: Zenon Mayo Other Clinician: Jeanine Luz Referring Rael Yo: Zenon Mayo Treating Jameeka Marcy/Extender: Skipper Cliche in Treatment: 10 Wound Status Wound Number: 8 Primary Pressure Ulcer Etiology: Wound Location: Left Gluteus Wound Open Wounding Event: Gradually Appeared Status: Date Acquired: 12/22/2020 Comorbid Cataracts, Glaucoma, Anemia, Hypertension, Type II Weeks Of Treatment: 4 History: Diabetes, End Stage Renal Disease, Neuropathy Clustered Wound: No Photos Wound Measurements Length: (cm) 0.3 Width: (cm) 1.3 Depth: (cm) 0.1 Area: (cm) 0.306 Volume: (cm) 0.031 % Reduction in Area: 97.8% % Reduction in Volume: 97.8% Epithelialization: None Tunneling: No Undermining: No Wound Description Classification: Category/Stage III Exudate Amount: Medium Exudate Type: Serosanguineous Exudate Color: red, brown Foul Odor After Cleansing: No Slough/Fibrino Yes Wound Bed Granulation Amount: Large (67-100%) Exposed Structure Granulation Quality: Red, Pink Fascia Exposed: No Necrotic Amount: Small (1-33%) Fat Layer (Subcutaneous Tissue) Exposed: Yes Necrotic Quality: Adherent Slough Tendon Exposed: No Muscle Exposed: No Joint Exposed: No Bone Exposed: No Treatment Notes Wound #8 (Gluteus) Wound Laterality: Left Cleanser Peri-Wound Care Desitin Maximum  Strength Ointment 4 (oz) Skin Prep Discharge Instruction: Use skin prep as directed LIZZETTE, MORANG ([id number]) Topical Primary Dressing Silvercel Small 2x2 (in/in) Discharge Instruction: Apply Silvercel Small 2x2 (in/in) as instructed Secondary Dressing Mepilex  Border Flex, 4x4 (in/in) Discharge Instruction: Apply to wound as directed. Do not cut. Secured With Compression Wrap Compression Stockings Environmental education officer) Signed: 02/16/2021 5:07:41 PM By: Georges Mouse, Minus Breeding RN Entered By: Georges Mouse, Minus Breeding on 02/16/2021 12:17:07 [name] ([id number]) -------------------------------------------------------------------------------- Wound Assessment Details Patient Name: [name] Date of Service: 02/16/2021 10:45 AM Medical Record Number: [id number] Patient Account Number: [id number] Date of Birth/Sex: 04/25/1971 (49 y.o. F) Treating RN: Dolan Amen Primary Care Emmett Arntz: Zenon Mayo Other Clinician: Jeanine Luz Referring Lyla Jasek: Zenon Mayo Treating Siler Mavis/Extender: Skipper Cliche in Treatment: 10 Wound Status Wound Number: 9 Primary Calciphylaxis Etiology: Wound Location: Left, Lateral Calf Wound Open Wounding Event: Gradually Appeared Status: Date Acquired: 02/16/2021 Comorbid Cataracts, Glaucoma, Anemia, Hypertension, Type II Weeks Of Treatment: 0 History: Diabetes, End Stage Renal Disease, Neuropathy Clustered Wound: No Photos Wound Measurements Length: (cm) 4 % Reduc Width: (cm) 2.2 % Reduc Depth: (cm) 0.1 Epithel Area: (cm) 6.912 Tunnel Volume: (cm) 0.691 Underm tion in Area: 0% tion in Volume: 0% ialization: None ing: No ining: No Wound Description Classification: Unclassifiable Foul O Exudate Amount: None Present Slough dor After Cleansing: No /Fibrino No Wound Bed Granulation Amount: None Present (0%) Exposed Structure Necrotic Amount: Large (67-100%) Fascia Exposed: No Necrotic Quality: Eschar Fat Layer (Subcutaneous Tissue) Exposed: No Tendon Exposed: No Muscle Exposed: No Joint Exposed: No Bone Exposed: No Treatment Notes Wound #9 (Calf) Wound Laterality: Left, Lateral Cleanser Peri-Wound Care Topical Betadine Primary Dressing CARYLON, ROSENBERG  ([id number]) Secondary Dressing Secured With Compression Wrap Compression Stockings Add-Ons Electronic Signature(s) Signed: 02/16/2021 5:07:41 PM By: Georges Mouse, Minus Breeding RN Entered By: Georges Mouse, Minus Breeding on 02/16/2021 11:58:13 [name] ([id number]) -------------------------------------------------------------------------------- Vitals Details Patient Name: [name] Date of Service: 02/16/2021 10:45 AM Medical Record Number: [id number] Patient Account Number: [id number] Date of Birth/Sex: [birth year] (50 y.o. F) Treating RN: Dolan Amen Primary Care Codie Krogh: Zenon Mayo Other Clinician: Jeanine Luz Referring Amalea Ottey: Zenon Mayo Treating Janaysha Depaulo/Extender: Skipper Cliche in Treatment: 10 Vital Signs Time Taken: 10:50 Temperature (F): 98.0 Height (in): 66 Pulse (bpm): 66 Weight (lbs): 235 Respiratory Rate (breaths/min): 18 Body Mass Index (BMI): 37.9 Blood Pressure (mmHg): 150/89 Reference Range: 80 - 120 mg / dl Electronic Signature(s) Signed: 02/17/2021 4:51:14 PM By: Jeanine Luz Entered By: Jeanine Luz on 02/16/2021 11:00:20

## 2021-02-26 NOTE — Progress Notes (Signed)
URSELA, MORIAN ([id number]) Visit Report for 02/26/2021 Arrival Information Details Patient Name: Amy Moses, Amy Moses Date of Service: 02/26/2021 9:00 AM Medical Record Number: [id number] Patient Account Number: [id number] Date of Birth/Sex: 1971/09/20 (50 y.o. F) Treating RN: Dolan Amen Primary Care Corrie Reder: Zenon Mayo Other Clinician: Referring Jossalin Chervenak: Zenon Mayo Treating Wheeler Incorvaia/Extender: Skipper Cliche in Treatment: 12 Visit Information History Since Last Visit Added or deleted any medications: No Patient Arrived: [place] since last visit: No Arrival Time: 08:55 Pain Present Now: No Accompanied By: EMS Transfer Assistance: Stretcher Patient Identification Verified: Yes Secondary Verification Process Completed: Yes Patient Requires Transmission-Based Precautions: No Patient Has Alerts: No Electronic Signature(s) Signed: 02/26/2021 11:58:22 AM By: Georges Mouse, Minus Breeding RN Entered By: Georges Mouse, Minus Breeding on 02/26/2021 08:57:24 Amy Moses ([id number]) -------------------------------------------------------------------------------- Clinic Level of Care Assessment Details Patient Name: Amy Moses Date of Service: 02/26/2021 9:00 AM Medical Record Number: [id number] Patient Account Number: [id number] Date of Birth/Sex: 16-Aug-1971 (49 y.o. F) Treating RN: Cornell Barman Primary Care Nguyen Butler: Zenon Mayo Other Clinician: Referring Kennon Encinas: Zenon Mayo Treating Hatcher Froning/Extender: Skipper Cliche in Treatment: 12 Clinic Level of Care Assessment Items TOOL 4 Quantity Score '[]'$  - Use when only an EandM is performed on FOLLOW-UP visit 0 ASSESSMENTS - Nursing Assessment / Reassessment X - Reassessment of Co-morbidities (includes updates in patient status) 1 10 X- 1 5 Reassessment of Adherence to Treatment Plan ASSESSMENTS - Wound and Skin Assessment / Reassessment '[]'$  - Simple Wound Assessment / Reassessment - one wound 0 X- 8 5 Complex  Wound Assessment / Reassessment - multiple wounds '[]'$  - 0 Dermatologic / Skin Assessment (not related to wound area) ASSESSMENTS - Focused Assessment '[]'$  - Circumferential Edema Measurements - multi extremities 0 '[]'$  - 0 Nutritional Assessment / Counseling / Intervention '[]'$  - 0 Lower Extremity Assessment (monofilament, tuning fork, pulses) '[]'$  - 0 Peripheral Arterial Disease Assessment (using hand held doppler) ASSESSMENTS - Ostomy and/or Continence Assessment and Care '[]'$  - Incontinence Assessment and Management 0 '[]'$  - 0 Ostomy Care Assessment and Management (repouching, etc.) PROCESS - Coordination of Care X - Simple Patient / Family Education for ongoing care 1 15 '[]'$  - 0 Complex (extensive) Patient / Family Education for ongoing care X- 1 10 Staff obtains Consents, Records, Test Results / Process Orders '[]'$  - 0 Staff telephones HHA, Nursing Homes / Clarify orders / etc '[]'$  - 0 Routine Transfer to another Facility (non-emergent condition) '[]'$  - 0 Routine Hospital Admission (non-emergent condition) '[]'$  - 0 New Admissions / Biomedical engineer / Ordering NPWT, Apligraf, etc. '[]'$  - 0 Emergency Hospital Admission (emergent condition) X- 1 10 Simple Discharge Coordination '[]'$  - 0 Complex (extensive) Discharge Coordination PROCESS - Special Needs '[]'$  - Pediatric / Minor Patient Management 0 '[]'$  - 0 Isolation Patient Management '[]'$  - 0 Hearing / Language / Visual special needs '[]'$  - 0 Assessment of Community assistance (transportation, D/C planning, etc.) '[]'$  - 0 Additional assistance / Altered mentation '[]'$  - 0 Support Surface(s) Assessment (bed, cushion, seat, etc.) INTERVENTIONS - Wound Cleansing / Measurement Tarquinio, Belen ([id number]) '[]'$  - 0 Simple Wound Cleansing - one wound X- 8 5 Complex Wound Cleansing - multiple wounds X- 1 5 Wound Imaging (photographs - any number of wounds) '[]'$  - 0 Wound Tracing (instead of photographs) '[]'$  - 0 Simple Wound Measurement - one  wound X- 8 5 Complex Wound Measurement - multiple wounds INTERVENTIONS - Wound Dressings X - Small Wound Dressing one or multiple wounds 2 10 X- 2 15 Medium Wound Dressing one or multiple wounds  X- 4 20 Large Wound Dressing one or multiple wounds '[]'$  - 0 Application of Medications - topical '[]'$  - 0 Application of Medications - injection INTERVENTIONS - Miscellaneous '[]'$  - External ear exam 0 '[]'$  - 0 Specimen Collection (cultures, biopsies, blood, body fluids, etc.) '[]'$  - 0 Specimen(s) / Culture(s) sent or taken to Lab for analysis '[]'$  - 0 Patient Transfer (multiple staff / Civil Service fast streamer / Similar devices) '[]'$  - 0 Simple Staple / Suture removal (25 or less) '[]'$  - 0 Complex Staple / Suture removal (26 or more) '[]'$  - 0 Hypo / Hyperglycemic Management (close monitor of Blood Glucose) '[]'$  - 0 Ankle / Brachial Index (ABI) - do not check if billed separately X- 1 5 Vital Signs Has the patient been seen at the hospital within the last three years: Yes Total Score: 310 Level Of Care: New/Established - Level 5 Electronic Signature(s) Signed: 02/26/2021 5:04:35 PM By: Gretta Cool, BSN, RN, CWS, Kim RN, BSN Entered By: Gretta Cool, BSN, RN, CWS, Kim on 02/26/2021 10:24:30 Amy Moses ([id number]) -------------------------------------------------------------------------------- Encounter Discharge Information Details Patient Name: Amy Moses Date of Service: 02/26/2021 9:00 AM Medical Record Number: [id number] Patient Account Number: [id number] Date of Birth/Sex: [birth year] (49 y.o. F) Treating RN: Cornell Barman Primary Care Vergie Zahm: Zenon Mayo Other Clinician: Referring Yumi Insalaco: Zenon Mayo Treating Mushka Laconte/Extender: Skipper Cliche in Treatment: 12 Encounter Discharge Information Items Discharge Condition: Stable Ambulatory Status: Stretcher Discharge Destination: Home Transportation: Ambulance Accompanied By: EMS Schedule Follow-up Appointment: Yes Clinical Summary of  Care: Electronic Signature(s) Signed: 02/26/2021 5:04:35 PM By: Gretta Cool, BSN, RN, CWS, Kim RN, BSN Entered By: Gretta Cool, BSN, RN, CWS, Kim on 02/26/2021 10:25:53 Amy Moses ([id number]) -------------------------------------------------------------------------------- Lower Extremity Assessment Details Patient Name: Amy Moses Date of Service: 02/26/2021 9:00 AM Medical Record Number: [id number] Patient Account Number: [id number] Date of Birth/Sex: [birth year] (49 y.o. F) Treating RN: Dolan Amen Primary Care Kaiesha Tonner: Zenon Mayo Other Clinician: Referring Aaryana Betke: Zenon Mayo Treating Luxe Cuadros/Extender: Jeri Cos Weeks in Treatment: 12 Edema Assessment Assessed: [Left: Yes] [Right: Yes] Edema: [Left: Yes] [Right: Yes] Electronic Signature(s) Signed: 02/26/2021 11:58:22 AM By: Georges Mouse, Minus Breeding RN Entered By: Georges Mouse, Minus Breeding on 02/26/2021 10:06:32 Amy Moses ([id number]) -------------------------------------------------------------------------------- Multi Wound Chart Details Patient Name: Amy Moses Date of Service: 02/26/2021 9:00 AM Medical Record Number: [id number] Patient Account Number: [id number] Date of Birth/Sex: [birth year] (49 y.o. F) Treating RN: Cornell Barman Primary Care Imya Mance: Zenon Mayo Other Clinician: Referring Rosemae Mcquown: Zenon Mayo Treating Arieana Somoza/Extender: Skipper Cliche in Treatment: 12 Vital Signs Height(in): 66 Pulse(bpm): 75 Weight(lbs): 235 Blood Pressure(mmHg): 155/101 Body Mass Index(BMI): 38 Temperature(F): 97.9 Respiratory Rate(breaths/min): 18 Photos: Wound Location: Right, Medial Upper Leg Right, Lateral Calf Left, Medial Upper Leg Wounding Event: Gradually Appeared Gradually Appeared Gradually Appeared Primary Etiology: Calciphylaxis Calciphylaxis Calciphylaxis Comorbid History: Cataracts, Glaucoma, Anemia, Cataracts, Glaucoma, Anemia, Cataracts, Glaucoma, Anemia, Hypertension, Type II Diabetes,  End Hypertension, Type II Diabetes, End Hypertension, Type II Diabetes, End Stage Renal Disease, Neuropathy Stage Renal Disease, Neuropathy Stage Renal Disease, Neuropathy Date Acquired: 07/22/2020 02/16/2021 07/22/2020 Weeks of Treatment: '12 1 12 '$ Wound Status: Open Open Open Clustered Wound: No No No Clustered Quantity: N/A N/A N/A Measurements L x W x D (cm) 19x20x1.3 2x1x0.1 16x8x1 Area (cm) : 298.451 1.571 100.531 Volume (cm) : 387.987 0.157 100.531 % Reduction in Area: -32.90% 27.30% 17.90% % Reduction in Volume: -1627.30% 27.30% -720.50% Classification: Full Thickness Without Exposed Unclassifiable Full Thickness Without Exposed Support Structures Support Structures Exudate Amount: Large None Present Large Exudate Type: Serosanguineous N/A Serosanguineous Exudate Color: red,  brown N/A red, brown Granulation Amount: Medium (34-66%) None Present (0%) Medium (34-66%) Granulation Quality: Red N/A Red Necrotic Amount: Medium (34-66%) Large (67-100%) Medium (34-66%) Necrotic Tissue: Eschar, Adherent [place] Exposed Structures: Fat Layer (Subcutaneous Tissue): Fascia: No Fat Layer (Subcutaneous Tissue): Yes Fat Layer (Subcutaneous Tissue): Yes Fascia: No No Fascia: No Tendon: No Tendon: No Tendon: No Muscle: No Muscle: No Muscle: No Joint: No Joint: No Joint: No Bone: No Bone: No Bone: No Epithelialization: None None None Wound Number: '3 4 5 '$ Photos: No Photos JENAVEE, GOULAS ([id number]) Wound Location: Left, Lateral Upper Leg Right Calcaneus Right, Posterior Upper Leg Wounding Event: Gradually Appeared Gradually Appeared Gradually Appeared Primary Etiology: Calciphylaxis Pressure Ulcer Pressure Ulcer Comorbid History: Cataracts, Glaucoma, Anemia, Cataracts, Glaucoma, Anemia, N/A Hypertension, Type II Diabetes, End Hypertension, Type II Diabetes, End Stage Renal Disease, Neuropathy Stage Renal Disease, Neuropathy Date Acquired: 07/22/2020  10/21/2020 12/22/2020 Weeks of Treatment: '12 12 6 '$ Wound Status: Open Open Healed - Epithelialized Clustered Wound: No No No Clustered Quantity: N/A N/A N/A Measurements L x W x D (cm) 4x14x0.4 6.2x6x0.4 0x0x0 Area (cm) : 43.982 29.217 0 Volume (cm) : 17.593 11.687 0 % Reduction in Area: -75.00% -3.30% 100.00% % Reduction in Volume: -600.10% -313.40% 100.00% Classification: Full Thickness Without Exposed Unstageable/Unclassified Category/Stage III Support Structures Exudate Amount: Large Medium N/A Exudate Type: Serosanguineous Serosanguineous N/A Exudate Color: red, brown red, brown N/A Granulation Amount: Medium (34-66%) Small (1-33%) N/A Granulation Quality: Red Pink N/A Necrotic Amount: Medium (34-66%) Large (67-100%) N/A Necrotic Tissue: Eschar, Adherent [place] N/A Exposed Structures: Fat Layer (Subcutaneous Tissue): Fat Layer (Subcutaneous Tissue): N/A Yes Yes Fascia: No Fascia: No Tendon: No Tendon: No Muscle: No Muscle: No Joint: No Joint: No Bone: No Bone: No Epithelialization: None Large (67-100%) N/A Wound Number: '6 7 8 '$ Photos: No Photos Wound Location: Right Gluteus Left Gluteal fold Left Gluteus Wounding Event: Gradually Appeared Gradually Appeared Gradually Appeared Primary Etiology: Pressure Ulcer Pressure Ulcer Pressure Ulcer Comorbid History: Cataracts, Glaucoma, Anemia, N/A Cataracts, Glaucoma, Anemia, Hypertension, Type II Diabetes, End Hypertension, Type II Diabetes, End Stage Renal Disease, Neuropathy Stage Renal Disease, Neuropathy Date Acquired: 12/22/2020 12/22/2020 12/22/2020 Weeks of Treatment: '6 6 6 '$ Wound Status: Open Healed - Epithelialized Open Clustered Wound: Yes No No Clustered Quantity: 3 N/A N/A Measurements L x W x D (cm) 8x3x0.1 0x0x0 2.3x0.9x0.1 Area (cm) : 18.85 0 1.626 Volume (cm) : 1.885 0 0.163 % Reduction in Area: 70.00% 100.00% 88.50% % Reduction in Volume: 70.00% 100.00% 88.50% Classification:  Category/Stage III Category/Stage III Category/Stage III Exudate Amount: Medium N/A Medium Exudate Type: Serosanguineous N/A Serosanguineous Exudate Color: red, brown N/A red, brown Granulation Amount: Large (67-100%) N/A Large (67-100%) Granulation Quality: Red, Friable N/A Red, Pink Necrotic Amount: Small (1-33%) N/A Small (1-33%) Necrotic Tissue: Adherent [place] Exposed Structures: Fat Layer (Subcutaneous Tissue): N/A Fat Layer (Subcutaneous Tissue): Yes Yes Bosshart, Vito Berger ([id number]) Fascia: No Fascia: No Tendon: No Tendon: No Muscle: No Muscle: No Joint: No Joint: No Bone: No Bone: No Epithelialization: Medium (34-66%) N/A None Wound Number: 9 N/A N/A Photos: N/A N/A Wound Location: Left, Lateral Calf N/A N/A Wounding Event: Gradually Appeared N/A N/A Primary Etiology: Calciphylaxis N/A N/A Comorbid History: Cataracts, Glaucoma, Anemia, N/A N/A Hypertension, Type II Diabetes, End Stage Renal Disease, Neuropathy Date Acquired: 02/16/2021 N/A N/A Weeks of Treatment: 1 N/A N/A Wound Status: Open N/A N/A Clustered Wound: No N/A N/A Clustered Quantity: N/A N/A N/A Measurements L x W x  D (cm) 4x2.3x0.1 N/A N/A Area (cm) : 7.226 N/A N/A Volume (cm) : 0.723 N/A N/A % Reduction in Area: -4.50% N/A N/A % Reduction in Volume: -4.60% N/A N/A Classification: Unclassifiable N/A N/A Exudate Amount: None Present N/A N/A Exudate Type: N/A N/A N/A Exudate Color: N/A N/A N/A Granulation Amount: None Present (0%) N/A N/A Granulation Quality: N/A N/A N/A Necrotic Amount: Large (67-100%) N/A N/A Necrotic Tissue: Eschar N/A N/A Exposed Structures: Fascia: No N/A N/A Fat Layer (Subcutaneous Tissue): No Tendon: No Muscle: No Joint: No Bone: No Epithelialization: None N/A N/A Treatment Notes Electronic Signature(s) Signed: 02/26/2021 5:04:35 PM By: Gretta Cool, BSN, RN, CWS, Kim RN, BSN Entered By: Gretta Cool, BSN, RN, CWS, Kim on 02/26/2021 10:18:16 Amy Moses ([id number]) -------------------------------------------------------------------------------- Multi-Disciplinary Care Plan Details Patient Name: Amy Moses Date of Service: 02/26/2021 9:00 AM Medical Record Number: [id number] Patient Account Number: [id number] Date of Birth/Sex: 1971-03-05 (49 y.o. F) Treating RN: Cornell Barman Primary Care Rodneshia Greenhouse: Zenon Mayo Other Clinician: Referring Korin Setzler: Zenon Mayo Treating Cord Wilczynski/Extender: Skipper Cliche in Treatment: 12 Active Inactive Wound/Skin Impairment Nursing Diagnoses: Impaired tissue integrity Goals: Patient/caregiver will verbalize understanding of skin care regimen Date Initiated: 12/02/2020 Target Resolution Date: 01/30/2021 Goal Status: Active Ulcer/skin breakdown will have a volume reduction of 30% by week 4 Date Initiated: 12/02/2020 Date Inactivated: 01/15/2021 Target Resolution Date: 01/02/2021 Goal Status: Unmet Unmet Reason: comorbities Ulcer/skin breakdown will have a volume reduction of 50% by week 8 Date Initiated: 01/15/2021 Target Resolution Date: 01/30/2021 Goal Status: Active Interventions: Assess ulceration(s) every visit Treatment Activities: Referred to DME Nilan Iddings for dressing supplies : 12/02/2020 Skin care regimen initiated : 12/02/2020 Topical wound management initiated : 12/02/2020 Notes: Electronic Signature(s) Signed: 02/26/2021 5:04:35 PM By: Gretta Cool, BSN, RN, CWS, Kim RN, BSN Entered By: Gretta Cool, BSN, RN, CWS, Kim on 02/26/2021 10:17:36 Amy Moses ([id number]) -------------------------------------------------------------------------------- Pain Assessment Details Patient Name: Amy Moses Date of Service: 02/26/2021 9:00 AM Medical Record Number: [id number] Patient Account Number: [id number] Date of Birth/Sex: [birth year] (49 y.o. F) Treating RN: Dolan Amen Primary Care Natan Hartog: Zenon Mayo Other Clinician: Referring Nadea Kirkland: Zenon Mayo Treating  Gwendola Hornaday/Extender: Skipper Cliche in Treatment: 12 Active Problems Location of Pain Severity and Description of Pain Patient Has Paino No Site Locations Rate the pain. Current Pain Level: 0 Pain Management and Medication Current Pain Management: Electronic Signature(s) Signed: 02/26/2021 11:58:22 AM By: Georges Mouse, Minus Breeding RN Entered By: Georges Mouse, Minus Breeding on 02/26/2021 09:06:18 Amy Moses ([id number]) -------------------------------------------------------------------------------- Patient/Caregiver Education Details Patient Name: Amy Moses Date of Service: 02/26/2021 9:00 AM Medical Record Number: [id number] Patient Account Number: [id number] Date of Birth/Gender: July 13, 1971 (50 y.o. F) Treating RN: Cornell Barman Primary Care Physician: Zenon Mayo Other Clinician: Referring Physician: Zenon Mayo Treating Physician/Extender: Skipper Cliche in Treatment: 12 Education Assessment Education Provided To: Patient Education Topics Provided Wound/Skin Impairment: Handouts: Caring for Your Ulcer, Other: Orders sent to homehealth Methods: Demonstration, Explain/Verbal Responses: State content correctly Electronic Signature(s) Signed: 02/26/2021 5:04:35 PM By: Gretta Cool, BSN, RN, CWS, Kim RN, BSN Entered By: Gretta Cool, BSN, RN, CWS, Kim on 02/26/2021 10:25:09 Amy Moses ([id number]) -------------------------------------------------------------------------------- Wound Assessment Details Patient Name: Amy Moses Date of Service: 02/26/2021 9:00 AM Medical Record Number: [id number] Patient Account Number: [id number] Date of Birth/Sex: 03/08/1971 (49 y.o. F) Treating RN: Dolan Amen Primary Care Maelyn Berrey: Zenon Mayo Other Clinician: Referring Emanual Lamountain: Zenon Mayo Treating Alaijah Gibler/Extender: Skipper Cliche in Treatment: 12 Wound Status Wound Number: 1 Primary Calciphylaxis Etiology: Wound Location: Right, Medial Upper Leg Wound Open Wounding  Event: Gradually Appeared Status:  Date Acquired: 07/22/2020 Comorbid Cataracts, Glaucoma, Anemia, Hypertension, Type II Weeks Of Treatment: 12 History: Diabetes, End Stage Renal Disease, Neuropathy Clustered Wound: No Photos Wound Measurements Length: (cm) 19 Width: (cm) 20 Depth: (cm) 1.3 Area: (cm) 298.451 Volume: (cm) 387.987 % Reduction in Area: -32.9% % Reduction in Volume: -1627.3% Epithelialization: None Tunneling: No Undermining: No Wound Description Classification: Full Thickness Without Exposed Support Structu Exudate Amount: Large Exudate Type: Serosanguineous Exudate Color: red, brown res Foul Odor After Cleansing: No Slough/Fibrino Yes Wound Bed Granulation Amount: Medium (34-66%) Exposed Structure Granulation Quality: Red Fascia Exposed: No Necrotic Amount: Medium (34-66%) Fat Layer (Subcutaneous Tissue) Exposed: Yes Necrotic Quality: Eschar, Adherent Slough Tendon Exposed: No Muscle Exposed: No Joint Exposed: No Bone Exposed: No Treatment Notes Wound #1 (Upper Leg) Wound Laterality: Right, Medial Cleanser Peri-Wound Care Topical Primary Dressing ARREN, DUNAVAN ([id number]) Secondary Dressing Secured With Compression Wrap Compression Stockings Add-Ons Electronic Signature(s) Signed: 02/26/2021 11:58:22 AM By: Georges Mouse, Minus Breeding RN Entered By: Georges Mouse, Minus Breeding on 02/26/2021 10:03:52 Amy Moses ([id number]) -------------------------------------------------------------------------------- Wound Assessment Details Patient Name: Amy Moses Date of Service: 02/26/2021 9:00 AM Medical Record Number: [id number] Patient Account Number: [id number] Date of Birth/Sex: [birth year] (50 y.o. F) Treating RN: Dolan Amen Primary Care Harl Wiechmann: Zenon Mayo Other Clinician: Referring Arieon Corcoran: Zenon Mayo Treating Ashlyn Cabler/Extender: Skipper Cliche in Treatment: 12 Wound Status Wound Number: 10 Primary  Calciphylaxis Etiology: Wound Location: Right, Lateral Calf Wound Open Wounding Event: Gradually Appeared Status: Date Acquired: 02/16/2021 Comorbid Cataracts, Glaucoma, Anemia, Hypertension, Type II Weeks Of Treatment: 1 History: Diabetes, End Stage Renal Disease, Neuropathy Clustered Wound: No Photos Wound Measurements Length: (cm) 2 % Reduc Width: (cm) 1 % Reduc Depth: (cm) 0.1 Epithel Area: (cm) 1.571 Tunnel Volume: (cm) 0.157 Underm tion in Area: 27.3% tion in Volume: 27.3% ialization: None ing: No ining: No Wound Description Classification: Unclassifiable Foul Od Exudate Amount: None Present Slough/ or After Cleansing: No Fibrino No Wound Bed Granulation Amount: None Present (0%) Exposed Structure Necrotic Amount: Large (67-100%) Fascia Exposed: No Necrotic Quality: Eschar Fat Layer (Subcutaneous Tissue) Exposed: No Tendon Exposed: No Muscle Exposed: No Joint Exposed: No Bone Exposed: No Treatment Notes Wound #10 (Calf) Wound Laterality: Right, Lateral Cleanser Peri-Wound Care Topical Betadine Primary Dressing ANJOLAOLUWA, STJULIAN ([id number]) Secondary Dressing Secured With Compression Wrap Compression Stockings Add-Ons Electronic Signature(s) Signed: 02/26/2021 11:58:22 AM By: Georges Mouse, Minus Breeding RN Entered By: Georges Mouse, Minus Breeding on 02/26/2021 09:25:52 Amy Moses ([id number]) -------------------------------------------------------------------------------- Wound Assessment Details Patient Name: Amy Moses Date of Service: 02/26/2021 9:00 AM Medical Record Number: [id number] Patient Account Number: [id number] Date of Birth/Sex: [birth year] (50 y.o. F) Treating RN: Dolan Amen Primary Care Austan Nicholl: Zenon Mayo Other Clinician: Referring Argyle Gustafson: Zenon Mayo Treating Carleigh Buccieri/Extender: Skipper Cliche in Treatment: 12 Wound Status Wound Number: 2 Primary Calciphylaxis Etiology: Wound Location: Left, Medial Upper Leg Wound  Open Wounding Event: Gradually Appeared Status: Date Acquired: 07/22/2020 Comorbid Cataracts, Glaucoma, Anemia, Hypertension, Type II Weeks Of Treatment: 12 History: Diabetes, End Stage Renal Disease, Neuropathy Clustered Wound: No Photos Wound Measurements Length: (cm) 16 Width: (cm) 8 Depth: (cm) 1 Area: (cm) 100.531 Volume: (cm) 100.531 % Reduction in Area: 17.9% % Reduction in Volume: -720.5% Epithelialization: None Tunneling: No Undermining: No Wound Description Classification: Full Thickness Without Exposed Support Structu Exudate Amount: Large Exudate Type: Serosanguineous Exudate Color: red, brown res Foul Odor After Cleansing: No Slough/Fibrino Yes Wound Bed Granulation Amount: Medium (34-66%) Exposed Structure Granulation Quality: Red Fascia Exposed: No Necrotic Amount: Medium (34-66%) Fat Layer (Subcutaneous Tissue) Exposed: Yes Necrotic  Quality: Eschar, Adherent Slough Tendon Exposed: No Muscle Exposed: No Joint Exposed: No Bone Exposed: No Treatment Notes Wound #2 (Upper Leg) Wound Laterality: Left, Medial Cleanser Peri-Wound Care Topical Primary Dressing KALECIA, DITMORE ([id number]) Secondary Dressing Secured With Compression Wrap Compression Stockings Add-Ons Electronic Signature(s) Signed: 02/26/2021 11:58:22 AM By: Georges Mouse, Minus Breeding RN Entered By: Georges Mouse, Minus Breeding on 02/26/2021 10:04:32 Amy Moses ([id number]) -------------------------------------------------------------------------------- Wound Assessment Details Patient Name: Amy Moses Date of Service: 02/26/2021 9:00 AM Medical Record Number: [id number] Patient Account Number: [id number] Date of Birth/Sex: Apr 02, 1971 (50 y.o. F) Treating RN: Dolan Amen Primary Care Blayze Haen: Zenon Mayo Other Clinician: Referring Lynix Bonine: Zenon Mayo Treating Quantavius Humm/Extender: Skipper Cliche in Treatment: 12 Wound Status Wound Number: 3 Primary  Calciphylaxis Etiology: Wound Location: Left, Lateral Upper Leg Wound Open Wounding Event: Gradually Appeared Status: Date Acquired: 07/22/2020 Comorbid Cataracts, Glaucoma, Anemia, Hypertension, Type II Weeks Of Treatment: 12 History: Diabetes, End Stage Renal Disease, Neuropathy Clustered Wound: No Photos Wound Measurements Length: (cm) 4 Width: (cm) 14 Depth: (cm) 0.4 Area: (cm) 43.982 Volume: (cm) 17.593 % Reduction in Area: -75% % Reduction in Volume: -600.1% Epithelialization: None Tunneling: No Undermining: No Wound Description Classification: Full Thickness Without Exposed Support Structu Exudate Amount: Large Exudate Type: Serosanguineous Exudate Color: red, brown res Foul Odor After Cleansing: No Slough/Fibrino Yes Wound Bed Granulation Amount: Medium (34-66%) Exposed Structure Granulation Quality: Red Fascia Exposed: No Necrotic Amount: Medium (34-66%) Fat Layer (Subcutaneous Tissue) Exposed: Yes Necrotic Quality: Eschar, Adherent Slough Tendon Exposed: No Muscle Exposed: No Joint Exposed: No Bone Exposed: No Treatment Notes Wound #3 (Upper Leg) Wound Laterality: Left, Lateral Cleanser Peri-Wound Care Topical Primary Dressing YAMILED, PICONE ([id number]) Secondary Dressing Secured With Compression Wrap Compression Stockings Add-Ons Electronic Signature(s) Signed: 02/26/2021 11:58:22 AM By: Georges Mouse, Minus Breeding RN Entered By: Georges Mouse, Minus Breeding on 02/26/2021 10:05:01 Amy Moses ([id number]) -------------------------------------------------------------------------------- Wound Assessment Details Patient Name: Amy Moses Date of Service: 02/26/2021 9:00 AM Medical Record Number: [id number] Patient Account Number: [id number] Date of Birth/Sex: March 02, 1971 (50 y.o. F) Treating RN: Dolan Amen Primary Care Stephenie Navejas: Zenon Mayo Other Clinician: Referring Mirah Nevins: Zenon Mayo Treating Chee Kinslow/Extender: Skipper Cliche in  Treatment: 12 Wound Status Wound Number: 4 Primary Pressure Ulcer Etiology: Wound Location: Right Calcaneus Wound Open Wounding Event: Gradually Appeared Status: Date Acquired: 10/21/2020 Comorbid Cataracts, Glaucoma, Anemia, Hypertension, Type II Weeks Of Treatment: 12 History: Diabetes, End Stage Renal Disease, Neuropathy Clustered Wound: No Photos Wound Measurements Length: (cm) 6.2 Width: (cm) 6 Depth: (cm) 0.4 Area: (cm) 29.217 Volume: (cm) 11.687 % Reduction in Area: -3.3% % Reduction in Volume: -313.4% Epithelialization: Large (67-100%) Tunneling: No Undermining: No Wound Description Classification: Unstageable/Unclassified Foul O Exudate Amount: Medium Slough Exudate Type: Serosanguineous Exudate Color: red, brown dor After Cleansing: No /Fibrino Yes Wound Bed Granulation Amount: Small (1-33%) Exposed Structure Granulation Quality: Pink Fascia Exposed: No Necrotic Amount: Large (67-100%) Fat Layer (Subcutaneous Tissue) Exposed: Yes Necrotic Quality: Eschar, Adherent Slough Tendon Exposed: No Muscle Exposed: No Joint Exposed: No Bone Exposed: No Treatment Notes Wound #4 (Calcaneus) Wound Laterality: Right Cleanser Peri-Wound Care Topical Betadine DENESA, GONTERMAN ([id number]) Discharge Instruction: apply to eascar Primary Dressing Silvercel Small 2x2 (in/in) Discharge Instruction: open areas Secondary Dressing Mepilex Border Flex, 4x4 (in/in) Discharge Instruction: Apply to wound as directed. Do not cut. Secured With Compression Wrap Compression Stockings Add-Ons Electronic Signature(s) Signed: 02/26/2021 11:58:22 AM By: Georges Mouse, Minus Breeding RN Entered By: Georges Mouse, Kenia on 02/26/2021 09:27:44 Amy Moses ([id number]) -------------------------------------------------------------------------------- Wound Assessment Details Patient Name: Amy Moses Date of Service:  02/26/2021 9:00 AM Medical Record Number: [id number] Patient  Account Number: [id number] Date of Birth/Sex: 01/08/1971 (49 y.o. F) Treating RN: Cornell Barman Primary Care Ishaaq Penna: Zenon Mayo Other Clinician: Referring Shalini Mair: Zenon Mayo Treating Calinda Stockinger/Extender: Skipper Cliche in Treatment: 12 Wound Status Wound Number: 5 Primary Etiology: Pressure Ulcer Wound Location: Right, Posterior Upper Leg Wound Status: Healed - Epithelialized Wounding Event: Gradually Appeared Date Acquired: 12/22/2020 Weeks Of Treatment: 6 Clustered Wound: No Wound Measurements Length: (cm) 0 Width: (cm) 0 Depth: (cm) 0 Area: (cm) 0 Volume: (cm) 0 % Reduction in Area: 100% % Reduction in Volume: 100% Wound Description Classification: Category/Stage III Treatment Notes Wound #5 (Upper Leg) Wound Laterality: Right, Posterior Cleanser Peri-Wound Care Topical Primary Dressing Secondary Dressing Secured With Compression Wrap Compression Stockings Add-Ons Electronic Signature(s) Signed: 02/26/2021 5:04:35 PM By: Gretta Cool, BSN, RN, CWS, Kim RN, BSN Entered By: Gretta Cool, BSN, RN, CWS, Kim on 02/26/2021 10:15:07 Amy Moses ([id number]) -------------------------------------------------------------------------------- Wound Assessment Details Patient Name: Amy Moses Date of Service: 02/26/2021 9:00 AM Medical Record Number: [id number] Patient Account Number: [id number] Date of Birth/Sex: 24-Apr-1971 (49 y.o. F) Treating RN: Dolan Amen Primary Care Sacha Radloff: Zenon Mayo Other Clinician: Referring Jaidah Lomax: Zenon Mayo Treating Phat Dalton/Extender: Skipper Cliche in Treatment: 12 Wound Status Wound Number: 6 Primary Pressure Ulcer Etiology: Wound Location: Right Gluteus Wound Open Wounding Event: Gradually Appeared Status: Date Acquired: 12/22/2020 Comorbid Cataracts, Glaucoma, Anemia, Hypertension, Type II Weeks Of Treatment: 6 History: Diabetes, End Stage Renal Disease, Neuropathy Clustered Wound: Yes Photos Wound  Measurements Length: (cm) 8 % R Width: (cm) 3 % R Depth: (cm) 0.1 Epi Clustered Quantity: 3 Tun Area: (cm) 18.85 Un Volume: (cm) 1.885 eduction in Area: 70% eduction in Volume: 70% thelialization: Medium (34-66%) neling: No dermining: No Wound Description Classification: Category/Stage III Fou Exudate Amount: Medium Slo Exudate Type: Serosanguineous Exudate Color: red, brown l Odor After Cleansing: No ugh/Fibrino Yes Wound Bed Granulation Amount: Large (67-100%) Exposed Structure Granulation Quality: Red, Friable Fascia Exposed: No Necrotic Amount: Small (1-33%) Fat Layer (Subcutaneous Tissue) Exposed: Yes Necrotic Quality: Adherent Slough Tendon Exposed: No Muscle Exposed: No Joint Exposed: No Bone Exposed: No Treatment Notes Wound #6 (Gluteus) Wound Laterality: Right Cleanser Peri-Wound Care Desitin Maximum Strength Ointment 4 (oz) Skin Prep Amy Moses ([id number]) Discharge Instruction: Use skin prep as directed Topical Primary Dressing Silvercel Small 2x2 (in/in) Discharge Instruction: Apply Silvercel Small 2x2 (in/in) as instructed Secondary Dressing Mepilex Border Flex, 4x4 (in/in) Discharge Instruction: Apply to wound as directed. Do not cut. Secured With Compression Wrap Compression Stockings Add-Ons Electronic Signature(s) Signed: 02/26/2021 11:58:22 AM By: Georges Mouse, Minus Breeding RN Entered By: Georges Mouse, Minus Breeding on 02/26/2021 09:27:02 Amy Moses ([id number]) -------------------------------------------------------------------------------- Wound Assessment Details Patient Name: Amy Moses Date of Service: 02/26/2021 9:00 AM Medical Record Number: [id number] Patient Account Number: [id number] Date of Birth/Sex: [birth year] (50 y.o. F) Treating RN: Cornell Barman Primary Care Deaisa Merida: Zenon Mayo Other Clinician: Referring Keyasia Jolliff: Zenon Mayo Treating Laikyn Gewirtz/Extender: Skipper Cliche in Treatment: 12 Wound Status Wound  Number: 7 Primary Etiology: Pressure Ulcer Wound Location: Left Gluteal fold Wound Status: Healed - Epithelialized Wounding Event: Gradually Appeared Date Acquired: 12/22/2020 Weeks Of Treatment: 6 Clustered Wound: No Wound Measurements Length: (cm) 0 Width: (cm) 0 Depth: (cm) 0 Area: (cm) 0 Volume: (cm) 0 % Reduction in Area: 100% % Reduction in Volume: 100% Wound Description Classification: Category/Stage III Treatment Notes Wound #7 (Gluteal fold) Wound Laterality: Left Cleanser Peri-Wound Care Topical Primary Dressing Secondary Dressing Secured With Compression Wrap Compression Stockings Add-Ons Electronic Signature(s)  Signed: 02/26/2021 5:04:35 PM By: Gretta Cool, BSN, RN, CWS, Kim RN, BSN Entered By: Gretta Cool, BSN, RN, CWS, Kim on 02/26/2021 10:15:07 Amy Moses ([id number]) -------------------------------------------------------------------------------- Wound Assessment Details Patient Name: Amy Moses Date of Service: 02/26/2021 9:00 AM Medical Record Number: [id number] Patient Account Number: [id number] Date of Birth/Sex: 1971-05-20 (49 y.o. F) Treating RN: Dolan Amen Primary Care Novah Nessel: Zenon Mayo Other Clinician: Referring Felipa Laroche: Zenon Mayo Treating Eliyana Pagliaro/Extender: Skipper Cliche in Treatment: 12 Wound Status Wound Number: 8 Primary Pressure Ulcer Etiology: Wound Location: Left Gluteus Wound Open Wounding Event: Gradually Appeared Status: Date Acquired: 12/22/2020 Comorbid Cataracts, Glaucoma, Anemia, Hypertension, Type II Weeks Of Treatment: 6 History: Diabetes, End Stage Renal Disease, Neuropathy Clustered Wound: No Photos Wound Measurements Length: (cm) 2.3 % Reduct Width: (cm) 0.9 % Reduct Depth: (cm) 0.1 Epitheli Area: (cm) 1.626 Tunneli Volume: (cm) 0.163 Undermi ion in Area: 88.5% ion in Volume: 88.5% alization: None ng: No ning: No Wound Description Classification: Category/Stage III Foul Odo Exudate Amount:  Medium Slough/F Exudate Type: Serosanguineous Exudate Color: red, brown r After Cleansing: No ibrino Yes Wound Bed Granulation Amount: Large (67-100%) Exposed Structure Granulation Quality: Red, Pink Fascia Exposed: No Necrotic Amount: Small (1-33%) Fat Layer (Subcutaneous Tissue) Exposed: Yes Necrotic Quality: Adherent Slough Tendon Exposed: No Muscle Exposed: No Joint Exposed: No Bone Exposed: No Treatment Notes Wound #8 (Gluteus) Wound Laterality: Left Cleanser Peri-Wound Care Topical Primary Dressing LAMEISHA, DUY ([id number]) Silvercel Small 2x2 (in/in) Discharge Instruction: Apply Silvercel Small 2x2 (in/in) as instructed Secondary Dressing Secured With Compression Wrap Compression Stockings Add-Ons Electronic Signature(s) Signed: 02/26/2021 11:58:22 AM By: Georges Mouse, Minus Breeding RN Entered By: Georges Mouse, Minus Breeding on 02/26/2021 09:27:30 Amy Moses ([id number]) -------------------------------------------------------------------------------- Wound Assessment Details Patient Name: Amy Moses Date of Service: 02/26/2021 9:00 AM Medical Record Number: [id number] Patient Account Number: [id number] Date of Birth/Sex: 07/07/1971 (50 y.o. F) Treating RN: Dolan Amen Primary Care Anyelo Mccue: Zenon Mayo Other Clinician: Referring Otie Headlee: Zenon Mayo Treating Zaevion Parke/Extender: Skipper Cliche in Treatment: 12 Wound Status Wound Number: 9 Primary Calciphylaxis Etiology: Wound Location: Left, Lateral Calf Wound Open Wounding Event: Gradually Appeared Status: Date Acquired: 02/16/2021 Comorbid Cataracts, Glaucoma, Anemia, Hypertension, Type II Weeks Of Treatment: 1 History: Diabetes, End Stage Renal Disease, Neuropathy Clustered Wound: No Photos Wound Measurements Length: (cm) 4 % Reduct Width: (cm) 2.3 % Reduct Depth: (cm) 0.1 Epitheli Area: (cm) 7.226 Tunneli Volume: (cm) 0.723 Undermi ion in Area: -4.5% ion in Volume:  -4.6% alization: None ng: No ning: No Wound Description Classification: Unclassifiable Foul Odo Exudate Amount: None Present Slough/F r After Cleansing: No ibrino No Wound Bed Granulation Amount: None Present (0%) Exposed Structure Necrotic Amount: Large (67-100%) Fascia Exposed: No Necrotic Quality: Eschar Fat Layer (Subcutaneous Tissue) Exposed: No Tendon Exposed: No Muscle Exposed: No Joint Exposed: No Bone Exposed: No Treatment Notes Wound #9 (Calf) Wound Laterality: Left, Lateral Cleanser Peri-Wound Care Topical Betadine Primary Dressing TAUNI, WOODARDS ([id number]) Secondary Dressing Secured With Compression Wrap Compression Stockings Add-Ons Electronic Signature(s) Signed: 02/26/2021 11:58:22 AM By: Georges Mouse, Minus Breeding RN Entered By: Georges Mouse, Minus Breeding on 02/26/2021 [id number] Amy Moses ([id number]) -------------------------------------------------------------------------------- Vitals Details Patient Name: Amy Moses Date of Service: 02/26/2021 9:00 AM Medical Record Number: [id number] Patient Account Number: [id number] Date of Birth/Sex: February 03, 1971 (50 y.o. F) Treating RN: Dolan Amen Primary Care Violett Hobbs: Zenon Mayo Other Clinician: Referring Averie Hornbaker: Zenon Mayo Treating Danielle Mink/Extender: Skipper Cliche in Treatment: 12 Vital Signs Time Taken: 08:57 Temperature (F): 97.9 Height (in): 66 Pulse (bpm): 69 Weight (lbs): 235 Respiratory Rate (breaths/min): 18 Body  Mass Index (BMI): 37.9 Blood Pressure (mmHg): 155/101 Reference Range: 80 - 120 mg / dl Electronic Signature(s) Signed: 02/26/2021 11:58:22 AM By: Georges Mouse, Minus Breeding RN Entered By: Georges Mouse, Minus Breeding on 02/26/2021 09:06:11

## 2021-03-02 ENCOUNTER — Ambulatory Visit: Payer: Medicaid - Out of State | Admitting: Physician Assistant

## 2021-03-12 ENCOUNTER — Other Ambulatory Visit: Payer: Self-pay

## 2021-03-12 ENCOUNTER — Encounter: Payer: Medicaid - Out of State | Admitting: Physician Assistant

## 2021-03-12 DIAGNOSIS — E11621 Type 2 diabetes mellitus with foot ulcer: Secondary | ICD-10-CM | POA: Diagnosis not present

## 2021-03-12 NOTE — Progress Notes (Addendum)
[name], [name] ([id number]) Visit Report for 03/12/2021 Chief Complaint Document Details Patient Name: Amy Moses, GOWEN Date of Service: 03/12/2021 10:45 AM Medical Record Number: [id number] Patient Account Number: [id number] Date of Birth/Sex: 03-20-1971 (50 y.o. F) Treating RN: Carlene Coria Primary Care Provider: Zenon Mayo Other Clinician: Referring Provider: Zenon Mayo Treating Provider/Extender: Skipper Cliche in Treatment: 14 Information Obtained from: Patient Chief Complaint 12/02/2020; patient is here for review of extensive wounds on her bilateral thighs as well as an area on her right plantar heel Electronic Signature(s) Signed: 03/12/2021 11:09:57 AM By: Worthy Keeler PA-C Entered By: Worthy Keeler on 03/12/2021 11:09:56 Amy Moses ([id number]) -------------------------------------------------------------------------------- Debridement Details Patient Name: Amy Moses Date of Service: 03/12/2021 10:45 AM Medical Record Number: [id number] Patient Account Number: [id number] Date of Birth/Sex: 07/12/1971 (49 y.o. F) Treating RN: Carlene Coria Primary Care Provider: Zenon Mayo Other Clinician: Referring Provider: Zenon Mayo Treating Provider/Extender: Skipper Cliche in Treatment: 14 Debridement Performed for Wound #1 Right,Medial Upper Leg Assessment: Performed By: Physician Tommie Sams., PA-C Debridement Type: Debridement Level of Consciousness (Pre- Awake and Alert procedure): Pre-procedure Verification/Time Out Yes - 12:05 Taken: Start Time: 12:05 Pain Control: Lidocaine 5% topical ointment Total Area Debrided (L x W): 12 (cm) x 2 (cm) = 24 (cm) Tissue and other material Viable, Non-Viable, Slough, Subcutaneous, Skin: Dermis , Skin: Epidermis, Slough debrided: Level: Skin/Subcutaneous Tissue Debridement Description: Excisional Instrument: Curette Bleeding: Moderate Hemostasis Achieved: Silver Nitrate End Time:  12:22 Procedural Pain: 2 Post Procedural Pain: 0 Response to Treatment: Procedure was tolerated well Level of Consciousness (Post- Awake and Alert procedure): Post Debridement Measurements of Total Wound Length: (cm) 19 Width: (cm) 9 Depth: (cm) 0.5 Volume: (cm) 67.152 Character of Wound/Ulcer Post Debridement: Improved Post Procedure Diagnosis Same as Pre-procedure Electronic Signature(s) Signed: 03/12/2021 1:09:49 PM By: Worthy Keeler PA-C Signed: 03/17/2021 3:51:10 PM By: Carlene Coria RN Entered By: Carlene Coria on 03/12/2021 12:17:11 Amy Moses ([id number]) -------------------------------------------------------------------------------- Debridement Details Patient Name: Amy Moses Date of Service: 03/12/2021 10:45 AM Medical Record Number: [id number] Patient Account Number: [id number] Date of Birth/Sex: October 29, 1971 (49 y.o. F) Treating RN: Carlene Coria Primary Care Provider: Zenon Mayo Other Clinician: Referring Provider: Zenon Mayo Treating Provider/Extender: Skipper Cliche in Treatment: 14 Debridement Performed for Wound #2 Left,Medial Upper Leg Assessment: Performed By: Physician Tommie Sams., PA-C Debridement Type: Debridement Level of Consciousness (Pre- Awake and Alert procedure): Pre-procedure Verification/Time Out Yes - 12:05 Taken: Start Time: 12:05 Pain Control: Lidocaine 5% topical ointment Total Area Debrided (L x W): 4 (cm) x 3 (cm) = 12 (cm) Tissue and other material Viable, Non-Viable, Slough, Subcutaneous, Skin: Dermis , Skin: Epidermis, Slough debrided: Level: Skin/Subcutaneous Tissue Debridement Description: Excisional Instrument: Curette Bleeding: Moderate Hemostasis Achieved: Silver Nitrate End Time: 12:22 Procedural Pain: 2 Post Procedural Pain: 0 Response to Treatment: Procedure was tolerated well Level of Consciousness (Post- Awake and Alert procedure): Post Debridement Measurements of Total Wound Length:  (cm) 13.5 Width: (cm) 6.2 Depth: (cm) 0.9 Volume: (cm) 59.164 Character of Wound/Ulcer Post Debridement: Improved Post Procedure Diagnosis Same as Pre-procedure Electronic Signature(s) Signed: 03/12/2021 1:09:49 PM By: Worthy Keeler PA-C Signed: 03/17/2021 3:51:10 PM By: Carlene Coria RN Entered By: Carlene Coria on 03/12/2021 12:19:27 Amy Moses ([id number]) -------------------------------------------------------------------------------- Debridement Details Patient Name: Amy Moses Date of Service: 03/12/2021 10:45 AM Medical Record Number: [id number] Patient Account Number: [id number] Date of Birth/Sex: 1971-04-16 (49 y.o. F) Treating RN: Carlene Coria Primary Care Provider: Zenon Mayo Other Clinician: Referring Provider: Zenon Mayo Treating Provider/Extender: Joaquim Lai,  Giann Obara Weeks in Treatment: 14 Debridement Performed for Wound #3 Left,Lateral Upper Leg Assessment: Performed By: Physician Tommie Sams., PA-C Debridement Type: Debridement Level of Consciousness (Pre- Awake and Alert procedure): Pre-procedure Verification/Time Out Yes - 12:05 Taken: Start Time: 12:05 Pain Control: Lidocaine 5% topical ointment Total Area Debrided (L x W): 1 (cm) x 1 (cm) = 1 (cm) Tissue and other material Viable, Non-Viable, Slough, Subcutaneous, Skin: Dermis , Skin: Epidermis, Slough debrided: Level: Skin/Subcutaneous Tissue Debridement Description: Excisional Instrument: Curette Bleeding: Moderate Hemostasis Achieved: Silver Nitrate End Time: 12:22 Procedural Pain: 2 Post Procedural Pain: 0 Response to Treatment: Procedure was tolerated well Level of Consciousness (Post- Awake and Alert procedure): Post Debridement Measurements of Total Wound Length: (cm) 3.7 Width: (cm) 11.5 Depth: (cm) 0.7 Volume: (cm) 23.393 Character of Wound/Ulcer Post Debridement: Improved Post Procedure Diagnosis Same as Pre-procedure Electronic Signature(s) Signed: 03/12/2021  1:09:49 PM By: Worthy Keeler PA-C Signed: 03/17/2021 3:51:10 PM By: Carlene Coria RN Entered By: Carlene Coria on 03/12/2021 12:20:48 Amy Moses ([id number]) -------------------------------------------------------------------------------- HPI Details Patient Name: Amy Moses Date of Service: 03/12/2021 10:45 AM Medical Record Number: [id number] Patient Account Number: [id number] Date of Birth/Sex: [birth year] (49 y.o. F) Treating RN: Carlene Coria Primary Care Provider: Zenon Mayo Other Clinician: Referring Provider: Zenon Mayo Treating Provider/Extender: Skipper Cliche in Treatment: 14 History of Present Illness HPI Description: ADMISSION 12/02/2020 This is a 50 year old woman who is a type II diabetic. Although there are mentions of type 1 diabetes in epic clearly this woman is type II based on the fact that she was on oral agents for 10 years before starting insulin. She also is in chronic renal failure and recently started on dialysis I think in November. She relates her problems starting in September she started to develop skin excoriation on her bilateral inner thighs. She thought this was a friction phenomenon however the tissue wrist can progressively broke down. She was seen by her primary doctor on 10/21/2020 noted to have erythema on both thighs medially and a blister. MRIs were ordered and she was put on Silvadene and gauze. She was seen in the ER on 12/12 at the [place] clinic also noted to have a right unstageable heel wound. 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 She dialyzes at [place] in [facility] and she was started on sodium thiosulfate as far as the notes state. The patient states she gets something at dialysis every day although she has not exactly sure what they are giving her. She was noted by vein and vascular in [place] to have multiple open wounds on 11/19/2020 using Xeroform gauze.  She had an angiogram booked by Dr. Oneida Alar predominantly I think because of the right heel ulcer although this was canceled by the patient because of the weather. The patient has large necrotic wounds on both inner thighs with covering black eschar. There is also an area on the left lateral thigh and an eschared area on her right plantar heel. She has been using Betadine to the right heel Xeroform to the areas on her legs. The patient has Medicaid but is able to get the Xeroform, were not really sure how she is managing this although she lives in [place] and there may be different rules for Medicaid in [place] versus [place]. We certainly would not be able to get that here. Although the wounds certainly look like calciphylaxis there is a complete absence of meaningful pain which would be very  unusual. I wondered whether her neuropathy is particularly affected her sensation of pain since her recent left patella fracture does not seem to have been that painful either Past medical history includes stage V chronic renal failure starting on dialysis I think in November, clearly type 2 diabetes with neuropathy, hypertension, glaucoma, vitamin D deficiency, history of cholecystectomy, edema of both legs, hypothyroidism, she dialyzes Tuesday Thursday and Saturday at [facility] in [place] The patient had arterial studies on 11/19/2020 I think at vein and vascular in [place]. She had biphasic waveforms throughout the thigh on the right and at the popliteal proximally and distally in the ATA but the PTA and peroneal were not visualized. On the left again biphasic waveforms up to the level of the distal popliteal but not visualized in the tibial vessels. She was felt to have adequate flow where visualized. As noted she was supposed to have an angiogram which I think is certainly indicated [id number] upon evaluation today patient actually appears to be doing okay in regard to her wounds. This is actually  the first time of seeing her she saw Dr. Dellia Nims at the last visit she does have quite an extensive history based on review. With that being said I do not see any signs right now of active infection which is great news. Overall I am extremely pleased with where things stand in that regard. 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 I really feel like she may do better with Dakin's moistened gauze. I do think that we can see about ordering the Dakin's for her she is already using gauze and then wrapping with roll gauze anyway so really would not change much except for moistening some gauze and applying it to the wound bed. With that being said I do not see any signs of active infection at this time which is great news. The patient all in all seems to be in fairly good spirits all things considered. 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 Fortunately it looks like some of the eschar is loosening up so we should be able to get this removed which hopefully will help in speeding up the healing process. With that being said with regard to the patient's gluteal region she unfortunately has new pressure ulcerations open today which I think could benefit from possibly having a air mattress. With that being said I explained to the patient that unfortunately this could still take some time as far as getting it to heal completely. There does not appear to be any signs of active infection at this time which is great news. No fevers, chills, nausea, vomiting, or diarrhea. 02/16/2021 upon evaluation today patient appears to be doing about the same in regard to her heel. Her gluteal area is actually getting better. She did have surgery in regard to her thighs bilaterally as well as her knee. It  was not until she got into the operating room that it was noted that she had the wounds on the thighs. Subsequently according to the surgeons note dated 02/01/2021 they contacted the patient's daughter in order to discuss the fact they felt she needed to have the wound surgically debrided and a wound VAC placed and subsequently instead of patellar reconstruction they opted to proceed with removal of the Portion of the  patella in order to get this area to heal more effectively and quickly. Overall that seems to have done quite well. 02/26/2021 upon evaluation today patient's wounds actually appear to be doing excellent pretty much across the board I am very pleased. With that being said the wounds where she has the wound vacs placed on the thighs in particular seems to be doing a very good job as far as healing is concerned. There does not appear to be any evidence of infection which is great news and overall I am extremely pleased in that regard. No fevers, chills, nausea, vomiting, or diarrhea. 03/12/21 upon evaluation today patient appears to be doing well with regard to her wounds. I think she is doing excellent with the wound VAC and very pleased in that regard. Fortunately there does not appear to be any signs of infection I am that a try to do a little bit of light debridement today due to some necrotic regions noted in her wounds to try to help speed up the healing process here. Electronic Signature(s) Signed: 03/12/2021 1:03:16 PM By: Ebbie Latus, Audrea ([id number]) Entered By: Worthy Keeler on 03/12/2021 13:03:16 Amy Moses ([id number]) -------------------------------------------------------------------------------- Physical Exam Details Patient Name: Amy Moses Date of Service: 03/12/2021 10:45 AM Medical Record Number: [id number] Patient Account Number: [id number] Date of Birth/Sex: 14-Apr-1971 (49 y.o. F) Treating RN: Carlene Coria Primary Care Provider:  Zenon Mayo Other Clinician: Referring Provider: Zenon Mayo Treating Provider/Extender: Skipper Cliche in Treatment: 28 Constitutional Well-nourished and well-hydrated in no acute distress. Respiratory normal breathing without difficulty. Psychiatric this patient is able to make decisions and demonstrates good insight into disease process. Alert and Oriented x 3. pleasant and cooperative. Notes Patient's wound bed actually showed signs of good granulation and epithelization at this point. There does not appear to be any evidence of active infection which is great news. With that being said I do think the wound VAC is doing an excellent job though I do believe there is some areas here that I could improve by way of debridement. With that being said I did perform debridement as lightly as possible to clear away some of the necrotic regions in the central portion of the patient's wound on her legs at all locations. She tolerated that debridement today without complication and postdebridement wound bed appears to be doing much better which is great news. Electronic Signature(s) Signed: 03/12/2021 1:04:13 PM By: Worthy Keeler PA-C Entered By: Worthy Keeler on 03/12/2021 13:04:13 Amy Moses ([id number]) -------------------------------------------------------------------------------- Physician Orders Details Patient Name: Amy Moses Date of Service: 03/12/2021 10:45 AM Medical Record Number: [id number] Patient Account Number: [id number] Date of Birth/Sex: 12/19/1970 (49 y.o. F) Treating RN: Carlene Coria Primary Care Provider: Zenon Mayo Other Clinician: Referring Provider: Zenon Mayo Treating Provider/Extender: Skipper Cliche in Treatment: 82 Verbal / Phone Orders: No Diagnosis Coding ICD-10 Coding Code Description E83.59 Other disorders of calcium metabolism L97.118 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 L97.128  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 L89.610 Pressure ulcer of right heel, unstageable E11.42 Type 2 diabetes mellitus with diabetic polyneuropathy L89.313 Pressure ulcer of right buttock, stage 3 L89.323 Pressure ulcer of left buttock, stage 3 Follow-up Appointments o Return Appointment in 2 weeks. [place]: - Interim [place] for wound care. May utilize formulary equivalent dressing for wound treatment orders unless otherwise specified. Home Health Nurse may visit [facility] to address patientos  wound care needs. - 3 x a week Negative Pressure Wound Therapy Wound #1 Right,Medial Upper Leg o Wound VAC settings at 165mHg continuous pressure. Use foam to wound cavity. Please order WHITE foam to fill any tunnel/s and/or undermining when necessary. Change VAC dressing 3 X WEEK. Change canister as indicated when full. - no white foam ! o Home Health Nurse may d/c VAC for s/s of increased infection, significant wound regression, or uncontrolled drainage. [place] [phone] - MUST NOTIFY THIS CLINIC ASAP Wound #2 Left,Medial Upper Leg o Wound VAC settings at 1278mg continuous pressure. Use foam to wound cavity. Please order WHITE foam to fill any tunnel/s and/or undermining when necessary. Change VAC dressing 3 X WEEK. Change canister as indicated when full. - no white foam ! o Home Health Nurse may d/c VAC for s/s of increased infection, significant wound regression, or uncontrolled drainage. [facility] [phone]- MUST NOTIFY THIS CLINIC ASAP Wound #3 Left,Lateral Upper Leg o Wound VAC settings at 12531m continuous pressure. Use foam to wound cavity. Please order WHITE foam to fill any tunnel/s and/or undermining when necessary. Change VAC dressing 3 X WEEK. Change canister as indicated when full. - no white foam ! o Home Health Nurse may d/c VAC for s/s  of increased infection, significant wound regression, or uncontrolled drainage. [facility] [phone]. - MUST NOTIFY THIS CLINIC ASAP Wound Treatment Wound #10 - Calf Wound Laterality: Right, Lateral Topical: Betadine (Home Health) (Generic) 3 x Per Week/30 Days Wound #4 - Calcaneus Wound Laterality: Right Topical: Betadine (Home Health) (Generic) 3 x Per Week/30 Days Discharge Instructions: apply to eascar Primary Dressing: Silvercel Small 2x2 (in/in) (Home Health) 3 x Per Week/30 Days Discharge Instructions: open areas Secondary Dressing: Mepilex Border Flex, 4x4 (in/in) (Home Health) (Generic) 3 x Per Week/30 Days Discharge Instructions: Apply to wound as directed. Do not cut. MILCAROLENA, [id number] #6 - Gluteus Wound Laterality: Right Peri-Wound Care: Desitin Maximum Strength Ointment 4 (oz) (Home Health) (Generic) 3 x Per Week/30 Days Peri-Wound Care: Skin Prep (Home Health) (Generic) 3 x Per Week/30 Days Discharge Instructions: Use skin prep as directed Primary Dressing: Silvercel Small 2x2 (in/in) (Home Health) (Generic) 3 x Per Week/30 Days Discharge Instructions: Apply Silvercel Small 2x2 (in/in) as instructed Secondary Dressing: Mepilex Border Flex, 4x4 (in/in) (Home Health) (Generic) 3 x Per Week/30 Days Discharge Instructions: Apply to wound as directed. Do not cut. Wound #8 - Gluteus Wound Laterality: Left Primary Dressing: Silvercel Small 2x2 (in/in) (Home Health) (Generic) 3 x Per Week/30 Days Discharge Instructions: Apply Silvercel Small 2x2 (in/in) as instructed Secondary Dressing: Mepilex Border Flex, 4x4 (in/in) (Generic) 3 x Per Week/30 Days Discharge Instructions: Apply to wound as directed. Do not cut. Wound #9 - Calf Wound Laterality: Left, Lateral Topical: Betadine (Home Health) 3 x Per Week/30 Days Electronic Signature(s) Signed: 03/12/2021 5:11:44 PM By: StoWorthy Keeler-C Signed: 03/17/2021 3:51:10 PM By: EppCarlene CoriaN Previous Signature: 03/12/2021 1:09:49 PM Version By: StoWorthy Keeler-C Entered By: EppCarlene Coria 03/12/2021 13:20:42 MILFawn [id number]------------------------------------------------------------------------------- Problem List Details Patient Name: MILFawn Kirkte of Service: 03/12/2021 10:45 AM Medical Record Number: [id number] Account Number: [id number] of Birth/Sex: [birth year]49 y.o. F) Treating RN: EppCarlene Coriaimary Care Provider: RucZenon Mayoher Clinician: Referring Provider: RucZenon Mayoeating Provider/Extender: StoSkipper Cliche Treatment: 14 Active Problems ICD-10 Encounter Code Description Active Date MDM Diagnosis E83.59 Other disorders of calcium metabolism  12/02/2020 No Yes L97.118 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 12/02/2020 No Yes L97.128 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 12/02/2020 No Yes L89.610 Pressure ulcer of right heel, unstageable 12/02/2020 No Yes E11.42 Type 2 diabetes mellitus with diabetic polyneuropathy 12/02/2020 No Yes L89.313 Pressure ulcer of right buttock, stage 3 01/19/2021 No Yes L89.323 Pressure ulcer of left buttock, stage 3 01/19/2021 No Yes Inactive Problems Resolved Problems Electronic Signature(s) Signed: 03/12/2021 11:09:51 AM By: Worthy Keeler PA-C Entered By: Worthy Keeler on 03/12/2021 11:09:50 Amy Moses ([id number]) -------------------------------------------------------------------------------- Progress Note Details Patient Name: Amy Moses Date of Service: 03/12/2021 10:45 AM Medical Record Number: [id number] Patient Account Number: [id number] Date of Birth/Sex: September 18, 1971 (49 y.o. F) Treating RN: Carlene Coria Primary Care Provider: Zenon Mayo Other Clinician: Referring Provider: Zenon Mayo Treating Provider/Extender: Skipper Cliche in Treatment: 14 Subjective Chief  Complaint Information obtained from Patient 12/02/2020; patient is here for review of extensive wounds on her bilateral thighs as well as an area on her right plantar heel History of Present Illness (HPI) ADMISSION 12/02/2020 This is a 50 year old woman who is a type II diabetic. Although there are mentions of type 1 diabetes in epic clearly this woman is type II based on the fact that she was on oral agents for 10 years before starting insulin. She also is in chronic renal failure and recently started on dialysis I think in November. She relates her problems starting in September she started to develop skin excoriation on her bilateral inner thighs. She thought this was a friction phenomenon however the tissue wrist can progressively broke down. She was seen by her primary doctor on 10/21/2020 noted to have erythema on both thighs medially and a blister. MRIs were ordered and she was put on Silvadene and gauze. She was seen in the ER on 12/12 at the [place] clinic also noted to have a right unstageable heel wound. 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 She dialyzes at [place] in [facility] and she was started on sodium thiosulfate as far as the notes state. The patient states she gets something at dialysis every day although she has not exactly sure what they are giving her. She was noted by vein and vascular in [place] to have multiple open wounds on 11/19/2020 using Xeroform gauze. She had an angiogram booked by Dr. Oneida Alar predominantly I think because of the right heel ulcer although this was canceled by the patient because of the weather. The patient has large necrotic wounds on both inner thighs with covering black eschar. There is also an area on the left lateral thigh and an eschared area on her right plantar heel. She has been using Betadine to the right heel Xeroform to the areas on her legs. The patient has Medicaid but is  able to get the Xeroform, were not really sure how she is managing this although she lives in [place] and there may be different rules for Medicaid in [place] versus [place]. We certainly would not be able to get that here. 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 I wondered whether her neuropathy is particularly affected her sensation of pain since her recent left patella fracture does not seem to have been that painful either Past medical history includes stage V chronic renal failure starting on dialysis I think in November, clearly  type 2 diabetes with neuropathy, hypertension, glaucoma, vitamin D deficiency, history of cholecystectomy, edema of both legs, hypothyroidism, she dialyzes Tuesday Thursday and Saturday at [facility] in [place] The patient had arterial studies on 11/19/2020 I think at vein and vascular in [place]. She had biphasic waveforms throughout the thigh on the right and at the popliteal proximally and distally in the ATA but the PTA and peroneal were not visualized. On the left again biphasic waveforms up to the level of the distal popliteal but not visualized in the tibial vessels. She was felt to have adequate flow where visualized. As noted she was supposed to have an angiogram which I think is certainly indicated [id number] upon evaluation today patient actually appears to be doing okay in regard to her wounds. This is actually the first time of seeing her she saw Dr. Dellia Nims at the last visit she does have quite an extensive history based on review. With that being said I do not see any signs right now of active infection which is great news. Overall I am extremely pleased with where things stand in that regard. 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 I really feel like she may  do better with Dakin's moistened gauze. I do think that we can see about ordering the Dakin's for her she is already using gauze and then wrapping with roll gauze anyway so really would not change much except for moistening some gauze and applying it to the wound bed. With that being said I do not see any signs of active infection at this time which is great news. The patient all in all seems to be in fairly good spirits all things considered. 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 Fortunately it looks like some of the eschar is loosening up so we should be able to get this removed which hopefully will help in speeding up the healing process. With that being said with regard to the patient's gluteal region she unfortunately has new pressure ulcerations open today which I think could benefit from possibly having a air mattress. With that being said I explained to the patient that unfortunately this could still take some time as far as getting it to heal completely. There does not appear to be any signs of active infection at this time which is great news. No fevers, chills, nausea, vomiting, or diarrhea. 02/16/2021 upon evaluation today patient appears to be doing about the same in regard to her heel. Her gluteal area is actually getting better. She did have surgery in regard to her thighs bilaterally as well as her knee. It was not until she got into the operating room that it was noted that she had the wounds on the thighs. Subsequently according to the surgeons note dated 02/01/2021 they contacted the patient's daughter in order to discuss the fact they felt she needed to have the wound surgically debrided and a wound VAC placed and subsequently instead of patellar reconstruction they opted to proceed with removal of the Portion of the patella in order to get this area  to heal more effectively and quickly. Overall that seems to have done quite well. 02/26/2021 upon evaluation today patient's wounds actually appear to be doing excellent pretty much across the board I am very pleased. With that being  said the wounds where she has the wound vacs placed on the thighs in particular seems to be doing a very good job as far as healing is concerned. There does not appear to be any evidence of infection which is great news and overall I am extremely pleased in that regard. No fevers, chills, nausea, vomiting, or diarrhea. 03/12/21 upon evaluation today patient appears to be doing well with regard to her wounds. I think she is doing excellent with the wound VAC and very pleased in that regard. Fortunately there does not appear to be any signs of infection I am that a try to do a little bit of light debridement today due to some necrotic regions noted in her wounds to try to help speed up the healing process here. FAREEDA, MONEY ([id number]) Objective Constitutional Well-nourished and well-hydrated in no acute distress. Vitals Time Taken: 11:04 AM, Height: 66 in, Weight: 235 lbs, BMI: 37.9, Temperature: 97.9 F, Pulse: 74 bpm, Respiratory Rate: 18 breaths/min, Blood Pressure: 152/82 mmHg. Respiratory normal breathing without difficulty. Psychiatric this patient is able to make decisions and demonstrates good insight into disease process. Alert and Oriented x 3. pleasant and cooperative. General Notes: Patient's wound bed actually showed signs of good granulation and epithelization at this point. There does not appear to be any evidence of active infection which is great news. With that being said I do think the wound VAC is doing an excellent job though I do believe there is some areas here that I could improve by way of debridement. With that being said I did perform debridement as lightly as possible to clear away some of the necrotic regions in the central portion of  the patient's wound on her legs at all locations. She tolerated that debridement today without complication and postdebridement wound bed appears to be doing much better which is great news. Integumentary (Hair, Skin) Wound #1 status is Open. Original cause of wound was Gradually Appeared. The date acquired was: 07/22/2020. The wound has been in treatment 14 weeks. The wound is located on the Right,Medial Upper Leg. The wound measures 19cm length x 9cm width x 0.6cm depth; 134.303cm^2 area and 80.582cm^3 volume. Wound #10 status is Open. Original cause of wound was Gradually Appeared. The date acquired was: 02/16/2021. The wound has been in treatment 3 weeks. The wound is located on the Right,Lateral Calf. The wound measures 2cm length x 1cm width x 0.1cm depth; 1.571cm^2 area and 0.157cm^3 volume. There is no tunneling or undermining noted. There is a none present amount of drainage noted. There is no granulation within the wound bed. There is a large (67-100%) amount of necrotic tissue within the wound bed including Eschar. Wound #2 status is Open. Original cause of wound was Gradually Appeared. The date acquired was: 07/22/2020. The wound has been in treatment 14 weeks. The wound is located on the Left,Medial Upper Leg. The wound measures 13.5cm length x 6.2cm width x 0.9cm depth; 65.738cm^2 area and 59.164cm^3 volume. There is Fat Layer (Subcutaneous Tissue) exposed. There is no tunneling or undermining noted. There is a large amount of serosanguineous drainage noted. There is large (67-100%) red granulation within the wound bed. There is a small (1-33%) amount of necrotic tissue within the wound bed including Adherent Slough. Wound #3 status is Open. Original cause of wound was Gradually Appeared. The date acquired was: 07/22/2020. The wound has been in treatment 14 weeks. The wound is located on the Left,Lateral Upper Leg. The wound measures 3.7cm length x  11.5cm width x 0.7cm depth; 33.419cm^2 area  and 23.393cm^3 volume. There is Fat Layer (Subcutaneous Tissue) exposed. There is no tunneling or undermining noted. There is a large amount of serosanguineous drainage noted. There is medium (34-66%) red granulation within the wound bed. There is a medium (34-66%) amount of necrotic tissue within the wound bed including Adherent Slough. Wound #4 status is Open. Original cause of wound was Gradually Appeared. The date acquired was: 10/21/2020. The wound has been in treatment 14 weeks. The wound is located on the Right Calcaneus. The wound measures 5.5cm length x 5.5cm width x 0.2cm depth; 23.758cm^2 area and 4.752cm^3 volume. There is Fat Layer (Subcutaneous Tissue) exposed. There is no tunneling or undermining noted. There is a medium amount of serosanguineous drainage noted. There is small (1-33%) red granulation within the wound bed. There is a large (67-100%) amount of necrotic tissue within the wound bed including Eschar and Adherent Slough. Wound #6 status is Open. Original cause of wound was Gradually Appeared. The date acquired was: 12/22/2020. The wound has been in treatment 8 weeks. The wound is located on the Right Gluteus. The wound measures 6.5cm length x 1cm width x 0.1cm depth; 5.105cm^2 area and 0.511cm^3 volume. There is Fat Layer (Subcutaneous Tissue) exposed. There is no tunneling or undermining noted. There is a medium amount of serosanguineous drainage noted. There is large (67-100%) red, pink granulation within the wound bed. There is a small (1-33%) amount of necrotic tissue within the wound bed including Adherent Slough. Wound #8 status is Open. Original cause of wound was Gradually Appeared. The date acquired was: 12/22/2020. The wound has been in treatment 8 weeks. The wound is located on the Left Gluteus. The wound measures 0.1cm length x 0.1cm width x 0.1cm depth; 0.008cm^2 area and 0.001cm^3 volume. There is no tunneling or undermining noted. There is a none present amount of  drainage noted. There is no granulation within the wound bed. There is no necrotic tissue within the wound bed. Wound #9 status is Open. Original cause of wound was Gradually Appeared. The date acquired was: 02/16/2021. The wound has been in treatment 3 weeks. The wound is located on the Left,Lateral Calf. The wound measures 4.2cm length x 2cm width x 0.1cm depth; 6.597cm^2 area and 0.66cm^3 volume. There is no tunneling or undermining noted. There is a none present amount of drainage noted. There is no granulation within the wound bed. There is a large (67-100%) amount of necrotic tissue within the wound bed including Eschar. 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 There was a Excisional Skin/Subcutaneous Tissue Debridement with a total area of 24 sq cm performed by Tommie Sams., PA-C. With the following instrument(s): Curette to remove Viable and Non-Viable tissue/material. Material removed includes Subcutaneous Tissue, Slough, Skin: Dermis, and Skin: Epidermis after achieving pain control using Lidocaine 5% topical ointment. No specimens were taken. A time out was conducted at 12:05, prior to the start of the procedure. A Moderate amount of bleeding was controlled with Silver Nitrate. The procedure was tolerated well with a pain level of 2 throughout and  a pain level of 0 following the procedure. Post Debridement Measurements: 19cm length x 9cm width x 0.5cm depth; 67.152cm^3 volume. Character of  Wound/Ulcer Post Debridement is improved. Post procedure Diagnosis Wound #1: Same as Pre-Procedure Wound #2 Pre-procedure diagnosis of Wound #2 is a Calciphylaxis located on the Left,Medial Upper Leg . There was a Excisional Skin/Subcutaneous Tissue Debridement with a total area of 12 sq cm performed by Tommie Sams., PA-C. With the following instrument(s): Curette to remove Viable and Non-Viable tissue/material. Material removed includes Subcutaneous Tissue, Slough, Skin: Dermis, and Skin: Epidermis after achieving pain control using Lidocaine 5% topical ointment. No specimens were taken. A time out was conducted at 12:05, prior to the start of the procedure. A Moderate amount of bleeding was controlled with Silver Nitrate. The procedure was tolerated well with a pain level of 2 throughout and a pain level of 0 following the procedure. Post Debridement Measurements: 13.5cm length x 6.2cm width x 0.9cm depth; 59.164cm^3 volume. Character of Wound/Ulcer Post Debridement is improved. Post procedure Diagnosis Wound #2: Same as Pre-Procedure Wound #3 Pre-procedure diagnosis of Wound #3 is a Calciphylaxis located on the Left,Lateral Upper Leg . There was a Excisional Skin/Subcutaneous Tissue Debridement with a total area of 1 sq cm performed by Tommie Sams., PA-C. With the following instrument(s): Curette to remove Viable and Non-Viable tissue/material. Material removed includes Subcutaneous Tissue, Slough, Skin: Dermis, and Skin: Epidermis after achieving pain control using Lidocaine 5% topical ointment. No specimens were taken. A time out was conducted at 12:05, prior to the start of the procedure. A Moderate amount of bleeding was controlled with Silver Nitrate. The procedure was tolerated well with a pain level of 2 throughout and a pain level of 0 following the procedure. Post Debridement Measurements: 3.7cm length x 11.5cm width x 0.7cm depth; 23.393cm^3 volume. Character of Wound/Ulcer Post  Debridement is improved. Post procedure Diagnosis Wound #3: Same as Pre-Procedure Plan Follow-up Appointments: Return Appointment in 2 weeks. Home Health: [place]: - [facility] for wound care. May utilize formulary equivalent dressing for wound treatment orders unless otherwise specified. Home Health Nurse may visit [facility] to address patient s wound care needs. - 3 x a week Negative Pressure Wound Therapy: Wound #1 Right,Medial Upper Leg: Wound VAC settings at 119mHg continuous pressure. Use foam to wound cavity. Please order WHITE foam to fill any tunnel/s and/or undermining when necessary. Change VAC dressing 3 X WEEK. Change canister as indicated when full. - no white foam ! Home Health Nurse may d/c VAC for s/s of increased infection, significant wound regression, or uncontrolled drainage. [phone] - MUST NOTIFY THIS CLINIC ASAP Wound #2 Left,Medial Upper Leg: Wound VAC settings at 1282mg continuous pressure. Use foam to wound cavity. Please order WHITE foam to fill any tunnel/s and/or undermining when necessary. Change VAC dressing 3 X WEEK. Change canister as indicated when full. - no white foam ! Home Health Nurse may d/c VAC for s/s of increased infection, significant wound regression, or uncontrolled drainage. [facility] [phone] MUST NOTIFY THIS CLINIC ASAP MIDARELENE, [id number] #3 Left,Lateral Upper Leg: Wound VAC settings at 12531m continuous pressure. Use foam to wound cavity. Please order WHITE foam to fill any tunnel/s and/or undermining when necessary. Change VAC dressing 3 X WEEK. Change canister as indicated when full. - no white foam ! Home Health Nurse may d/c VAC for s/s of increased infection, significant wound regression, or  uncontrolled drainage. [place] at [phone]. - MUST NOTIFY THIS CLINIC ASAP WOUND #10: - Calf Wound Laterality:  Right, Lateral Topical: Betadine (Home Health) (Generic) 3 x Per Week/30 Days WOUND #4: - Calcaneus Wound Laterality: Right Topical: Betadine (Home Health) (Generic) 3 x Per Week/30 Days Discharge Instructions: apply to eascar Primary Dressing: Silvercel Small 2x2 (in/in) (Home Health) 3 x Per Week/30 Days Discharge Instructions: open areas Secondary Dressing: Mepilex Border Flex, 4x4 (in/in) (Home Health) (Generic) 3 x Per Week/30 Days Discharge Instructions: Apply to wound as directed. Do not cut. WOUND #6: - Gluteus Wound Laterality: Right Peri-Wound Care: Desitin Maximum Strength Ointment 4 (oz) (Home Health) (Generic) 3 x Per Week/30 Days Peri-Wound Care: Skin Prep (Home Health) (Generic) 3 x Per Week/30 Days Discharge Instructions: Use skin prep as directed Primary Dressing: Silvercel Small 2x2 (in/in) (Home Health) (Generic) 3 x Per Week/30 Days Discharge Instructions: Apply Silvercel Small 2x2 (in/in) as instructed Secondary Dressing: Mepilex Border Flex, 4x4 (in/in) (Home Health) (Generic) 3 x Per Week/30 Days Discharge Instructions: Apply to wound as directed. Do not cut. WOUND #8: - Gluteus Wound Laterality: Left Primary Dressing: Silvercel Small 2x2 (in/in) (Home Health) (Generic) 3 x Per Week/30 Days Discharge Instructions: Apply Silvercel Small 2x2 (in/in) as instructed WOUND #9: - Calf Wound Laterality: Left, Lateral Topical: Betadine (Home Health) 3 x Per Week/30 Days 1. Would recommend that we going continue with the wound care measures as before and the patient is in agreement with the plan. There does not appear to be any signs of active infection which is great and overall I am extremely pleased. With all that being said I think that the patient does seem to be doing better and though I did not do anything with the heel I think we will probably have to remove the eschar on the heel shortly as it is becoming very soft and loosening up quite a bit. We does have to be  careful if she does bleed being on the aspirin as well as dialysis at this point. 2. I am also can recommend that we have the patient continue with the wound vacs to the legs I think this is doing a great job and I do believe it is helping her tremendously as far as healing is concerned though she still has a long ways to go. 3. I am also can recommend we continue to monitor for any signs of infection everything appears to be doing well at the moment. We will see patient back for reevaluation in 2 weeks here in the clinic. If anything worsens or changes patient will contact our office for additional recommendations. Electronic Signature(s) Signed: 03/12/2021 1:05:11 PM By: Worthy Keeler PA-C Entered By: Worthy Keeler on 03/12/2021 13:05:11 Amy Moses ([id number]) -------------------------------------------------------------------------------- SuperBill Details Patient Name: Amy Moses Date of Service: 03/12/2021 Medical Record Number: [id number] Patient Account Number: [id number] Date of Birth/Sex: [birth year] (49 y.o. 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 L89.610 Pressure ulcer of right heel, unstageable E11.42 Type 2 diabetes mellitus with diabetic polyneuropathy L89.313 Pressure ulcer of right buttock, stage 3 L89.323 Pressure ulcer of left buttock, stage 3 Facility Procedures CPT4 Code: [id number] Description: [id number] - DEB SUBQ TISSUE  20 SQ CM/< Modifier: Quantity: 1 CPT4 Code: Description: ICD-10 Diagnosis Description [id number] Non-pressure chronic ulcer of right thigh with other  specified severity L97.128 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 Modifier: Quantity: CPT4 Code: [id number] Description: [id number] - DEB SUBQ TISS EA ADDL 20CM Modifier: Quantity: 1 CPT4 Code: Description: ICD-10 Diagnosis Description [id number] 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 L97.128 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 Modifier: Quantity: Physician Procedures CPT4 [id number] Description: [id number] - WC PHYS SUBQ TISS 20 SQ CM Modifier: Quantity: 1 CPT4 Code: Description: ICD-10 Diagnosis Description [id number] 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

## 2021-03-15 ENCOUNTER — Other Ambulatory Visit (HOSPITAL_COMMUNITY): Payer: Self-pay

## 2021-03-15 DIAGNOSIS — R609 Edema, unspecified: Secondary | ICD-10-CM

## 2021-03-17 NOTE — Progress Notes (Signed)
ZAHIA, MCADORY ([id number]) Visit Report for 03/12/2021 Arrival Information Details Patient Name: Amy Moses, Amy Moses Date of Service: 03/12/2021 10:45 AM Medical Record Number: [id number] Patient Account Number: [id number] Date of Birth/Sex: [birth year] (50 y.o. F) Treating RN: Dolan Amen Primary Care Naphtali Riede: Zenon Mayo Other Clinician: Referring Eric Nees: Zenon Mayo Treating Theador Jezewski/Extender: Skipper Cliche in Treatment: 14 Visit Information History Since Last Visit Added or deleted any medications: No Patient Arrived: Stretcher Had a fall or experienced change in No Arrival Time: 11:03 activities of daily living that may affect Accompanied By: self risk of falls: Transfer Assistance: None Hospitalized since last visit: No Patient Identification Verified: Yes Pain Present Now: No Secondary Verification Process Completed: Yes Patient Requires Transmission-Based Precautions: No Patient Has Alerts: No Electronic Signature(s) Signed: 03/12/2021 5:00:25 PM By: Georges Mouse, Minus Breeding RN Entered By: Georges Mouse, Minus Breeding on 03/12/2021 11:04:13 Amy Moses ([id number]) -------------------------------------------------------------------------------- Clinic Level of Care Assessment Details Patient Name: Amy Moses Date of Service: 03/12/2021 10:45 AM Medical Record Number: [id number] Patient Account Number: [id number] Date of Birth/Sex: [birth year] (49 y.o. F) Treating RN: Carlene Coria Primary Care Corisa Montini: Zenon Mayo Other Clinician: Referring Anisten Tomassi: Zenon Mayo Treating Franca Stakes/Extender: Skipper Cliche in Treatment: 14 Clinic Level of Care Assessment Items TOOL 1 Quantity Score '[]'$  - Use when EandM and Procedure is performed on INITIAL visit 0 ASSESSMENTS - Nursing Assessment / Reassessment '[]'$  - General Physical Exam (combine w/ comprehensive assessment (listed just below) when performed on new 0 pt. evals) '[]'$  - 0 Comprehensive Assessment  (HX, ROS, Risk Assessments, Wounds Hx, etc.) ASSESSMENTS - Wound and Skin Assessment / Reassessment '[]'$  - Dermatologic / Skin Assessment (not related to wound area) 0 ASSESSMENTS - Ostomy and/or Continence Assessment and Care '[]'$  - Incontinence Assessment and Management 0 '[]'$  - 0 Ostomy Care Assessment and Management (repouching, etc.) PROCESS - Coordination of Care '[]'$  - Simple Patient / Family Education for ongoing care 0 '[]'$  - 0 Complex (extensive) Patient / Family Education for ongoing care '[]'$  - 0 Staff obtains Programmer, systems, Records, Test Results / Process Orders '[]'$  - 0 Staff telephones HHA, Nursing Homes / Clarify orders / etc '[]'$  - 0 Routine Transfer to another Facility (non-emergent condition) '[]'$  - 0 Routine Hospital Admission (non-emergent condition) '[]'$  - 0 New Admissions / Biomedical engineer / Ordering NPWT, Apligraf, etc. '[]'$  - 0 Emergency Hospital Admission (emergent condition) PROCESS - Special Needs '[]'$  - Pediatric / Minor Patient Management 0 '[]'$  - 0 Isolation Patient Management '[]'$  - 0 Hearing / Language / Visual special needs '[]'$  - 0 Assessment of Community assistance (transportation, D/C planning, etc.) '[]'$  - 0 Additional assistance / Altered mentation '[]'$  - 0 Support Surface(s) Assessment (bed, cushion, seat, etc.) INTERVENTIONS - Miscellaneous '[]'$  - External ear exam 0 '[]'$  - 0 Patient Transfer (multiple staff / Civil Service fast streamer / Similar devices) '[]'$  - 0 Simple Staple / Suture removal (25 or less) '[]'$  - 0 Complex Staple / Suture removal (26 or more) '[]'$  - 0 Hypo/Hyperglycemic Management (do not check if billed separately) '[]'$  - 0 Ankle / Brachial Index (ABI) - do not check if billed separately Has the patient been seen at the hospital within the last three years: Yes Total Score: 0 Level Of Care: ____ Amy Moses ([id number]) Electronic Signature(s) Signed: 03/17/2021 3:51:10 PM By: Carlene Coria RN Entered By: Carlene Coria on 03/12/2021 13:17:26 Amy Moses  ([id number]) -------------------------------------------------------------------------------- Encounter Discharge Information Details Patient Name: Amy Moses Date of Service: 03/12/2021 10:45 AM Medical Record Number: [id number] Patient Account Number: [id number] Date of Birth/Sex:  04-08-1971 (50 y.o. F) Treating RN: Donnamarie Poag Primary Care Annamarie Yamaguchi: Zenon Mayo Other Clinician: Referring Miyu Fenderson: Zenon Mayo Treating Aften Lipsey/Extender: Skipper Cliche in Treatment: 14 Encounter Discharge Information Items Post Procedure Vitals Discharge Condition: Stable Temperature (F): 97.8 Ambulatory Status: Stretcher Pulse (bpm): 74 Discharge Destination: Home Respiratory Rate (breaths/min): 18 Transportation: Ambulance Blood Pressure (mmHg): 152/82 Accompanied By: ambulance Schedule Follow-up Appointment: Yes Clinical Summary of Care: Electronic Signature(s) Signed: 03/12/2021 4:01:56 PM By: Donnamarie Poag Entered By: Donnamarie Poag on 03/12/2021 12:41:18 Amy Moses ([id number]) -------------------------------------------------------------------------------- Lower Extremity Assessment Details Patient Name: Amy Moses Date of Service: 03/12/2021 10:45 AM Medical Record Number: [id number] Patient Account Number: [id number] Date of Birth/Sex: [birth year] (49 y.o. F) Treating RN: Dolan Amen Primary Care Danel Studzinski: Zenon Mayo Other Clinician: Referring Ilijah Doucet: Zenon Mayo Treating Brookelynn Hamor/Extender: Jeri Cos Weeks in Treatment: 14 Edema Assessment Assessed: [Left: Yes] Amy Moses: Yes] Edema: [Left: Yes] [Right: Yes] Vascular Assessment Pulses: Dorsalis Pedis Palpable: [Left:No Yes] [Right:No Yes] Electronic Signature(s) Signed: 03/12/2021 5:00:25 PM By: Georges Mouse, Minus Breeding RN Entered By: Georges Mouse, Minus Breeding on 03/12/2021 11:48:48 Amy Moses ([id number]) -------------------------------------------------------------------------------- Multi Wound  Chart Details Patient Name: Amy Moses Date of Service: 03/12/2021 10:45 AM Medical Record Number: [id number] Patient Account Number: [id number] Date of Birth/Sex: 1971/10/20 (49 y.o. F) Treating RN: Carlene Coria Primary Care Donat Humble: Zenon Mayo Other Clinician: Referring Jeryn Cerney: Zenon Mayo Treating Roseland Braun/Extender: Skipper Cliche in Treatment: 14 Vital Signs Height(in): 52 Pulse(bpm): 31 Weight(lbs): 235 Blood Pressure(mmHg): 152/82 Body Mass Index(BMI): 38 Temperature(F): 97.9 Respiratory Rate(breaths/min): 18 Photos: [1:No Photos] [10:No Photos] [2:No Photos] Wound Location: [1:Right, Medial Upper Leg] [10:Right, Lateral Calf] [2:Left, Medial Upper Leg] Wounding Event: [1:Gradually Appeared] [10:Gradually Appeared] [2:Gradually Appeared] Primary Etiology: [1:Calciphylaxis] [10:Calciphylaxis] [2:Calciphylaxis] Comorbid History: [1:N/A] [10:Cataracts, Glaucoma, Anemia, Hypertension, Type II Diabetes, End Hypertension, Type II Diabetes, End Stage Renal Disease, Neuropathy] [2:Cataracts, Glaucoma, Anemia, Stage Renal Disease, Neuropathy] Date Acquired: [1:07/22/2020] [10:02/16/2021] [2:07/22/2020] Weeks of Treatment: [1:14] [10:3] [2:14] Wound Status: [1:Open] [10:Open] [2:Open] Clustered Wound: [1:No] [10:No] [2:No] Clustered Quantity: [1:N/A] [10:N/A] [2:N/A] Measurements L x W x D (cm) [1:9x19x0.6] [10:2x1x0.1] [2:13.5x6.2x0.9] Area (cm) : [1:134.303] [10:1.571] [2:65.738] Volume (cm) : [1:80.582] [10:0.157] [2:59.164] % Reduction in Area: [1:40.20%] [10:27.30%] [2:46.30%] % Reduction in Volume: [1:-258.70%] [10:27.30%] [2:-382.90%] Classification: [1:Full Thickness Without Exposed Support Structures] [10:Unclassifiable] [2:Full Thickness Without Exposed Support Structures] Exudate Amount: [1:N/A] [10:None Present] [2:Large] Exudate Type: [1:N/A] [10:N/A] [2:Serosanguineous] Exudate Color: [1:N/A] [10:N/A] [2:red, brown] Granulation Amount: [1:N/A] [10:None  Present (0%)] [2:Large (67-100%)] Granulation Quality: [1:N/A] [10:N/A] [2:Red] Necrotic Amount: [1:N/A] [10:Large (67-100%)] [2:Small (1-33%)] Necrotic Tissue: [1:N/A N/A] [10:Eschar None] [2:Adherent Slough None] Wound Number: '3 4 6 '$ Photos: No Photos No Photos No Photos Wound Location: Left, Lateral Upper Leg Right Calcaneus Right Gluteus Wounding Event: Gradually Appeared Gradually Appeared Gradually Appeared Primary Etiology: Calciphylaxis Pressure Ulcer Pressure Ulcer Comorbid History: Cataracts, Glaucoma, Anemia, Cataracts, Glaucoma, Anemia, Cataracts, Glaucoma, Anemia, Hypertension, Type II Diabetes, End Hypertension, Type II Diabetes, End Hypertension, Type II Diabetes, End Stage Renal Disease, Neuropathy Stage Renal Disease, Neuropathy Stage Renal Disease, Neuropathy Date Acquired: 07/22/2020 10/21/2020 12/22/2020 Weeks of Treatment: '14 14 8 '$ Wound Status: Open Open Open Clustered Wound: No No Yes Clustered Quantity: N/A N/A 2 Measurements L x W x D (cm) 3.7x11.5x0.7 5.5x5.5x0.2 6.5x1x0.1 Area (cm) : 33.419 23.758 5.105 Volume (cm) : 23.393 4.752 0.511 % Reduction in Area: -33.00% 16.00% 91.90% % Reduction in Volume: -830.90% -68.10% 91.90% Classification: Full Thickness Without Exposed Unstageable/Unclassified Category/Stage III Support Structures Exudate Amount: Large Medium Medium Exudate Type: Serosanguineous Serosanguineous Serosanguineous  Exudate Color: red, brown red, brown red, brown Granulation Amount: Medium (34-66%) Small (1-33%) Large (67-100%) Amy Moses, Amy Moses ([id number]) Granulation Quality: Red Red Red, Pink Necrotic Amount: Medium (34-66%) Large (67-100%) Small (1-33%) Necrotic Tissue: Adherent Freeman Exposed Structures: Fat Layer (Subcutaneous Tissue): Fat Layer (Subcutaneous Tissue): Fat Layer (Subcutaneous Tissue): Yes Yes Yes Fascia: No Fascia: No Fascia: No Tendon: No Tendon: No Tendon: No Muscle: No Muscle:  No Muscle: No Joint: No Joint: No Joint: No Bone: No Bone: No Bone: No Epithelialization: None None Large (67-100%) Wound Number: 8 9 N/A Photos: No Photos No Photos N/A Wound Location: Left Gluteus Left, Lateral Calf N/A Wounding Event: Gradually Appeared Gradually Appeared N/A Primary Etiology: Pressure Ulcer Calciphylaxis N/A Comorbid History: Cataracts, Glaucoma, Anemia, Cataracts, Glaucoma, Anemia, N/A Hypertension, Type II Diabetes, End Hypertension, Type II Diabetes, End Stage Renal Disease, Neuropathy Stage Renal Disease, Neuropathy Date Acquired: 12/22/2020 02/16/2021 N/A Weeks of Treatment: 8 3 N/A Wound Status: Open Open N/A Clustered Wound: No No N/A Clustered Quantity: N/A N/A N/A Measurements L x W x D (cm) 0.1x0.1x0.1 4.2x2x0.1 N/A Area (cm) : 0.008 6.597 N/A Volume (cm) : 0.001 0.66 N/A % Reduction in Area: 99.90% 4.60% N/A % Reduction in Volume: 99.90% 4.50% N/A Classification: Category/Stage III Unclassifiable N/A Exudate Amount: None Present None Present N/A Exudate Type: N/A N/A N/A Exudate Color: N/A N/A N/A Granulation Amount: None Present (0%) None Present (0%) N/A Granulation Quality: N/A N/A N/A Necrotic Amount: None Present (0%) Large (67-100%) N/A Necrotic Tissue: N/A Eschar N/A Exposed Structures: Fascia: No Fascia: No N/A Fat Layer (Subcutaneous Tissue): Fat Layer (Subcutaneous Tissue): No No Tendon: No Tendon: No Muscle: No Muscle: No Joint: No Joint: No Bone: No Bone: No Epithelialization: Large (67-100%) None N/A Treatment Notes Electronic Signature(s) Signed: 03/17/2021 3:51:10 PM By: Carlene Coria RN Entered By: Carlene Coria on 03/12/2021 12:04:47 Amy Moses ([id number]) -------------------------------------------------------------------------------- [place] Details Patient Name: Amy Moses Date of Service: 03/12/2021 10:45 AM Medical Record Number: [id number] Patient Account Number:  [id number] Date of Birth/Sex: 1971-02-06 (49 y.o. F) Treating RN: Carlene Coria Primary Care Haizley Cannella: Zenon Mayo Other Clinician: Referring Morghan Kester: Zenon Mayo Treating Win Guajardo/Extender: Skipper Cliche in Treatment: 14 Active Inactive Wound/Skin Impairment Nursing Diagnoses: Impaired tissue integrity Goals: Patient/caregiver will verbalize understanding of skin care regimen Date Initiated: 12/02/2020 Target Resolution Date: 04/11/2021 Goal Status: Active Ulcer/skin breakdown will have a volume reduction of 30% by week 4 Date Initiated: 12/02/2020 Date Inactivated: 01/15/2021 Target Resolution Date: 01/02/2021 Goal Status: Unmet Unmet Reason: comorbities Ulcer/skin breakdown will have a volume reduction of 50% by week 8 Date Initiated: 01/15/2021 Date Inactivated: 03/12/2021 Target Resolution Date: 01/30/2021 Goal Status: Unmet Unmet Reason: comorbities Ulcer/skin breakdown will have a volume reduction of 80% by week 12 Date Initiated: 03/12/2021 Target Resolution Date: 03/12/2021 Goal Status: Active Interventions: Assess ulceration(s) every visit Treatment Activities: Referred to DME Read Bonelli for dressing supplies : 12/02/2020 Skin care regimen initiated : 12/02/2020 Topical wound management initiated : 12/02/2020 Notes: Electronic Signature(s) Signed: 03/17/2021 3:51:10 PM By: Carlene Coria RN Entered By: Carlene Coria on 03/12/2021 12:04:39 Amy Moses ([id number]) -------------------------------------------------------------------------------- Pain Assessment Details Patient Name: Amy Moses Date of Service: 03/12/2021 10:45 AM Medical Record Number: [id number] Patient Account Number: [id number] Date of Birth/Sex: [birth year] (49 y.o. F) Treating RN: Dolan Amen Primary Care Marion Rosenberry: Zenon Mayo Other Clinician: Referring Christerpher Clos: Zenon Mayo Treating Maridee Slape/Extender: Skipper Cliche in Treatment: 14 Active Problems Location of Pain Severity  and Description of Pain Patient Has Paino  No Site Locations Rate the pain. Current Pain Level: 0 Pain Management and Medication Current Pain Management: Electronic Signature(s) Signed: 03/12/2021 5:00:25 PM By: Georges Mouse, Minus Breeding RN Entered By: Georges Mouse, Minus Breeding on 03/12/2021 11:06:34 Amy Moses ([id number]) -------------------------------------------------------------------------------- Patient/Caregiver Education Details Patient Name: Amy Moses Date of Service: 03/12/2021 10:45 AM Medical Record Number: [id number] Patient Account Number: [id number] Date of Birth/Gender: May 25, 1971 (49 y.o. F) Treating RN: Carlene Coria Primary Care Physician: Zenon Mayo Other Clinician: Referring Physician: Zenon Mayo Treating Physician/Extender: Skipper Cliche in Treatment: 14 Education Assessment Education Provided To: Patient Education Topics Provided Wound/Skin Impairment: Methods: Explain/Verbal Responses: State content correctly Electronic Signature(s) Signed: 03/17/2021 3:51:10 PM By: Carlene Coria RN Entered By: Carlene Coria on 03/12/2021 13:17:42 Amy Moses ([id number]) -------------------------------------------------------------------------------- Wound Assessment Details Patient Name: Amy Moses Date of Service: 03/12/2021 10:45 AM Medical Record Number: [id number] Patient Account Number: [id number] Date of Birth/Sex: 07/19/1971 (49 y.o. F) Treating RN: Carlene Coria Primary Care Brittainy Bucker: Zenon Mayo Other Clinician: Referring Conroy Goracke: Zenon Mayo Treating Chelsi Warr/Extender: Skipper Cliche in Treatment: 14 Wound Status Wound Number: 1 Primary Etiology: Calciphylaxis Wound Location: Right, Medial Upper Leg Wound Status: Open Wounding Event: Gradually Appeared Date Acquired: 07/22/2020 Weeks Of Treatment: 14 Clustered Wound: No Photos Photo Uploaded By: Georges Mouse, Minus Breeding on 03/12/2021 17:02:26 Wound  Measurements Length: (cm) 19 Width: (cm) 9 Depth: (cm) 0.6 Area: (cm) 134.303 Volume: (cm) 80.582 % Reduction in Area: 40.2% % Reduction in Volume: -258.7% Wound Description Classification: Full Thickness Without Exposed Support Structu res Treatment Notes Wound #1 (Upper Leg) Wound Laterality: Right, Medial Cleanser Peri-Wound Care Topical Primary Dressing Secondary Dressing Secured With Compression Wrap Compression Stockings Add-Ons Electronic Signature(s) Signed: 03/17/2021 3:51:10 PM By: Carlene Coria RN Jerrol Banana, Niyana ([id number]) Entered By: Carlene Coria on 03/12/2021 12:17:29 Amy Moses ([id number]) -------------------------------------------------------------------------------- Wound Assessment Details Patient Name: Amy Moses Date of Service: 03/12/2021 10:45 AM Medical Record Number: [id number] Patient Account Number: [id number] Date of Birth/Sex: 04/20/1971 (50 y.o. F) Treating RN: Dolan Amen Primary Care Jaquia Benedicto: Zenon Mayo Other Clinician: Referring Miamarie Moll: Zenon Mayo Treating Niels Cranshaw/Extender: Skipper Cliche in Treatment: 14 Wound Status Wound Number: 10 Primary Calciphylaxis Etiology: Wound Location: Right, Lateral Calf Wound Open Wounding Event: Gradually Appeared Status: Date Acquired: 02/16/2021 Comorbid Cataracts, Glaucoma, Anemia, Hypertension, Type II Weeks Of Treatment: 3 History: Diabetes, End Stage Renal Disease, Neuropathy Clustered Wound: No Photos Photo Uploaded By: Georges Mouse, Minus Breeding on 03/12/2021 17:02:45 Wound Measurements Length: (cm) 2 Width: (cm) 1 Depth: (cm) 0.1 Area: (cm) 1.571 Volume: (cm) 0.157 % Reduction in Area: 27.3% % Reduction in Volume: 27.3% Epithelialization: None Tunneling: No Undermining: No Wound Description Classification: Unclassifiable Exudate Amount: None Present Foul Odor After Cleansing: No Slough/Fibrino No Wound Bed Granulation Amount: None Present (0%)  Exposed Structure Necrotic Amount: Large (67-100%) Fascia Exposed: No Necrotic Quality: Eschar Fat Layer (Subcutaneous Tissue) Exposed: No Tendon Exposed: No Muscle Exposed: No Joint Exposed: No Bone Exposed: No Treatment Notes Wound #10 (Calf) Wound Laterality: Right, Lateral Cleanser Peri-Wound Care Topical Betadine Amy Moses, Amy Moses ([id number]) Primary Dressing Secondary Dressing Secured With Compression Wrap Compression Stockings Add-Ons Electronic Signature(s) Signed: 03/12/2021 5:00:25 PM By: Georges Mouse, Minus Breeding RN Entered By: Georges Mouse, Minus Breeding on 03/12/2021 11:42:56 Amy Moses ([id number]) -------------------------------------------------------------------------------- Wound Assessment Details Patient Name: Amy Moses Date of Service: 03/12/2021 10:45 AM Medical Record Number: [id number] Patient Account Number: [id number] Date of Birth/Sex: 1971/04/24 (50 y.o. F) Treating RN: Dolan Amen Primary Care Breda Bond: Zenon Mayo Other Clinician: Referring Sakshi Sermons: Zenon Mayo Treating Kindel Rochefort/Extender: Skipper Cliche in Treatment:  14 Wound Status Wound Number: 2 Primary Calciphylaxis Etiology: Wound Location: Left, Medial Upper Leg Wound Open Wounding Event: Gradually Appeared Status: Date Acquired: 07/22/2020 Comorbid Cataracts, Glaucoma, Anemia, Hypertension, Type II Weeks Of Treatment: 14 History: Diabetes, End Stage Renal Disease, Neuropathy Clustered Wound: No Photos Photo Uploaded By: Georges Mouse, Minus Breeding on 03/12/2021 17:03:03 Wound Measurements Length: (cm) 13.5 Width: (cm) 6.2 Depth: (cm) 0.9 Area: (cm) 65.738 Volume: (cm) 59.164 % Reduction in Area: 46.3% % Reduction in Volume: -382.9% Epithelialization: None Tunneling: No Undermining: No Wound Description Classification: Full Thickness Without Exposed Support Structu Exudate Amount: Large Exudate Type: Serosanguineous Exudate Color: red, brown res Foul Odor  After Cleansing: No Slough/Fibrino Yes Wound Bed Granulation Amount: Large (67-100%) Exposed Structure Granulation Quality: Red Fascia Exposed: No Necrotic Amount: Small (1-33%) Fat Layer (Subcutaneous Tissue) Exposed: Yes Necrotic Quality: Adherent Slough Tendon Exposed: No Muscle Exposed: No Joint Exposed: No Bone Exposed: No Treatment Notes Wound #2 (Upper Leg) Wound Laterality: Left, Medial Cleanser Peri-Wound Care Topical Amy Moses, Amy Moses ([id number]) Primary Dressing Secondary Dressing Secured With Compression Wrap Compression Stockings Add-Ons Electronic Signature(s) Signed: 03/12/2021 5:00:25 PM By: Georges Mouse, Minus Breeding RN Entered By: Georges Mouse, Minus Breeding on 03/12/2021 11:42:41 Amy Moses ([id number]) -------------------------------------------------------------------------------- Wound Assessment Details Patient Name: Amy Moses Date of Service: 03/12/2021 10:45 AM Medical Record Number: [id number] Patient Account Number: [id number] Date of Birth/Sex: 05/16/1971 (50 y.o. F) Treating RN: Dolan Amen Primary Care Octavia Velador: Zenon Mayo Other Clinician: Referring Bethsaida Siegenthaler: Zenon Mayo Treating Kearia Yin/Extender: Skipper Cliche in Treatment: 14 Wound Status Wound Number: 3 Primary Calciphylaxis Etiology: Wound Location: Left, Lateral Upper Leg Wound Open Wounding Event: Gradually Appeared Status: Date Acquired: 07/22/2020 Comorbid Cataracts, Glaucoma, Anemia, Hypertension, Type II Weeks Of Treatment: 14 History: Diabetes, End Stage Renal Disease, Neuropathy Clustered Wound: No Photos Photo Uploaded By: Georges Mouse, Minus Breeding on 03/12/2021 17:03:18 Wound Measurements Length: (cm) 3.7 Width: (cm) 11.5 Depth: (cm) 0.7 Area: (cm) 33.419 Volume: (cm) 23.393 % Reduction in Area: -33% % Reduction in Volume: -830.9% Epithelialization: None Tunneling: No Undermining: No Wound Description Classification: Full Thickness Without Exposed  Support Structu Exudate Amount: Large Exudate Type: Serosanguineous Exudate Color: red, brown res Foul Odor After Cleansing: No Slough/Fibrino Yes Wound Bed Granulation Amount: Medium (34-66%) Exposed Structure Granulation Quality: Red Fascia Exposed: No Necrotic Amount: Medium (34-66%) Fat Layer (Subcutaneous Tissue) Exposed: Yes Necrotic Quality: Adherent Slough Tendon Exposed: No Muscle Exposed: No Joint Exposed: No Bone Exposed: No Treatment Notes Wound #3 (Upper Leg) Wound Laterality: Left, Lateral Cleanser Peri-Wound Care Topical Amy Moses, Amy Moses ([id number]) Primary Dressing Secondary Dressing Secured With Compression Wrap Compression Stockings Add-Ons Electronic Signature(s) Signed: 03/12/2021 5:00:25 PM By: Georges Mouse, Minus Breeding RN Entered By: Georges Mouse, Minus Breeding on 03/12/2021 11:43:24 Amy Moses ([id number]) -------------------------------------------------------------------------------- Wound Assessment Details Patient Name: Amy Moses Date of Service: 03/12/2021 10:45 AM Medical Record Number: [id number] Patient Account Number: [id number] Date of Birth/Sex: 1971-08-29 (50 y.o. F) Treating RN: Dolan Amen Primary Care Esly Selvage: Zenon Mayo Other Clinician: Referring Sukhman Kocher: Zenon Mayo Treating Makinze Jani/Extender: Skipper Cliche in Treatment: 14 Wound Status Wound Number: 4 Primary Pressure Ulcer Etiology: Wound Location: Right Calcaneus Wound Open Wounding Event: Gradually Appeared Status: Date Acquired: 10/21/2020 Comorbid Cataracts, Glaucoma, Anemia, Hypertension, Type II Weeks Of Treatment: 14 History: Diabetes, End Stage Renal Disease, Neuropathy Clustered Wound: No Photos Photo Uploaded By: Georges Mouse, Minus Breeding on 03/12/2021 17:03:37 Wound Measurements Length: (cm) 5.5 Width: (cm) 5.5 Depth: (cm) 0.2 Area: (cm) 23.758 Volume: (cm) 4.752 % Reduction in Area: 16% % Reduction in Volume:  -68.1% Epithelialization:  None Tunneling: No Undermining: No Wound Description Classification: Unstageable/Unclassified Exudate Amount: Medium Exudate Type: Serosanguineous Exudate Color: red, brown Foul Odor After Cleansing: No Slough/Fibrino Yes Wound Bed Granulation Amount: Small (1-33%) Exposed Structure Granulation Quality: Red Fascia Exposed: No Necrotic Amount: Large (67-100%) Fat Layer (Subcutaneous Tissue) Exposed: Yes Necrotic Quality: Eschar, Adherent Slough Tendon Exposed: No Muscle Exposed: No Joint Exposed: No Bone Exposed: No Treatment Notes Wound #4 (Calcaneus) Wound Laterality: Right Cleanser Peri-Wound Care Topical Betadine Amy Moses, Amy Moses ([id number]) Discharge Instruction: apply to eascar Primary Dressing Silvercel Small 2x2 (in/in) Discharge Instruction: open areas Secondary Dressing Mepilex Border Flex, 4x4 (in/in) Discharge Instruction: Apply to wound as directed. Do not cut. Secured With Compression Wrap Compression Stockings Environmental education officer) Signed: 03/12/2021 5:00:25 PM By: Georges Mouse, Minus Breeding RN Entered By: Georges Mouse, Minus Breeding on 03/12/2021 11:44:04 Amy Moses ([id number]) -------------------------------------------------------------------------------- Wound Assessment Details Patient Name: Amy Moses Date of Service: 03/12/2021 10:45 AM Medical Record Number: [id number] Patient Account Number: [id number] Date of Birth/Sex: 03-18-1971 (50 y.o. F) Treating RN: Dolan Amen Primary Care Sami Froh: Zenon Mayo Other Clinician: Referring Mel Langan: Zenon Mayo Treating Kanani Mowbray/Extender: Skipper Cliche in Treatment: 14 Wound Status Wound Number: 6 Primary Pressure Ulcer Etiology: Wound Location: Right Gluteus Wound Open Wounding Event: Gradually Appeared Status: Date Acquired: 12/22/2020 Comorbid Cataracts, Glaucoma, Anemia, Hypertension, Type II Weeks Of Treatment: 8 History: Diabetes, End  Stage Renal Disease, Neuropathy Clustered Wound: Yes Photos Photo Uploaded By: Georges Mouse, Minus Breeding on 03/12/2021 17:04:11 Wound Measurements Length: (cm) 6.5 Width: (cm) 1 Depth: (cm) 0.1 Clustered Quantity: 2 Area: (cm) 5.105 Volume: (cm) 0.511 % Reduction in Area: 91.9% % Reduction in Volume: 91.9% Epithelialization: Large (67-100%) Tunneling: No Undermining: No Wound Description Classification: Category/Stage III Exudate Amount: Medium Exudate Type: Serosanguineous Exudate Color: red, brown Foul Odor After Cleansing: No Slough/Fibrino Yes Wound Bed Granulation Amount: Large (67-100%) Exposed Structure Granulation Quality: Red, Pink Fascia Exposed: No Necrotic Amount: Small (1-33%) Fat Layer (Subcutaneous Tissue) Exposed: Yes Necrotic Quality: Adherent Slough Tendon Exposed: No Muscle Exposed: No Joint Exposed: No Bone Exposed: No Treatment Notes Wound #6 (Gluteus) Wound Laterality: Right Cleanser Peri-Wound Care Desitin Maximum Strength Ointment 4 (oz) Skin Prep Amy Moses ([id number]) Discharge Instruction: Use skin prep as directed Topical Primary Dressing Silvercel Small 2x2 (in/in) Discharge Instruction: Apply Silvercel Small 2x2 (in/in) as instructed Secondary Dressing Mepilex Border Flex, 4x4 (in/in) Discharge Instruction: Apply to wound as directed. Do not cut. Secured With Compression Wrap Compression Stockings Environmental education officer) Signed: 03/12/2021 5:00:25 PM By: Georges Mouse, Minus Breeding RN Entered By: Georges Mouse, Minus Breeding on 03/12/2021 11:44:36 Amy Moses ([id number]) -------------------------------------------------------------------------------- Wound Assessment Details Patient Name: Amy Moses Date of Service: 03/12/2021 10:45 AM Medical Record Number: [id number] Patient Account Number: [id number] Date of Birth/Sex: [birth year] (50 y.o. F) Treating RN: Dolan Amen Primary Care Nitisha Civello: Zenon Mayo  Other Clinician: Referring Elizeo Rodriques: Zenon Mayo Treating Becci Batty/Extender: Skipper Cliche in Treatment: 14 Wound Status Wound Number: 8 Primary Pressure Ulcer Etiology: Wound Location: Left Gluteus Wound Open Wounding Event: Gradually Appeared Status: Date Acquired: 12/22/2020 Comorbid Cataracts, Glaucoma, Anemia, Hypertension, Type II Weeks Of Treatment: 8 History: Diabetes, End Stage Renal Disease, Neuropathy Clustered Wound: No Photos Photo Uploaded By: Georges Mouse, Minus Breeding on 03/12/2021 17:04:29 Wound Measurements Length: (cm) 0.1 Width: (cm) 0.1 Depth: (cm) 0.1 Area: (cm) 0.008 Volume: (cm) 0.001 % Reduction in Area: 99.9% % Reduction in Volume: 99.9% Epithelialization: Large (67-100%) Tunneling: No Undermining: No Wound Description Classification: Category/Stage III Exudate Amount: None Present Foul Odor After Cleansing: No Slough/Fibrino No Wound  Bed Granulation Amount: None Present (0%) Exposed Structure Necrotic Amount: None Present (0%) Fascia Exposed: No Fat Layer (Subcutaneous Tissue) Exposed: No Tendon Exposed: No Muscle Exposed: No Joint Exposed: No Bone Exposed: No Treatment Notes Wound #8 (Gluteus) Wound Laterality: Left Cleanser Peri-Wound Care Topical Primary Dressing Amy Moses, Amy Moses ([id number]) Silvercel Small 2x2 (in/in) Discharge Instruction: Apply Silvercel Small 2x2 (in/in) as instructed Secondary Dressing Secured With Compression Wrap Compression Stockings Add-Ons Electronic Signature(s) Signed: 03/12/2021 5:00:25 PM By: Georges Mouse, Minus Breeding RN Entered By: Georges Mouse, Minus Breeding on 03/12/2021 11:42:11 Amy Moses ([id number]) -------------------------------------------------------------------------------- Wound Assessment Details Patient Name: Amy Moses Date of Service: 03/12/2021 10:45 AM Medical Record Number: [id number] Patient Account Number: [id number] Date of Birth/Sex: 17-Apr-1971 (50 y.o.  F) Treating RN: Dolan Amen Primary Care Toneka Fullen: Zenon Mayo Other Clinician: Referring Mischele Detter: Zenon Mayo Treating Cherika Jessie/Extender: Skipper Cliche in Treatment: 14 Wound Status Wound Number: 9 Primary Calciphylaxis Etiology: Wound Location: Left, Lateral Calf Wound Open Wounding Event: Gradually Appeared Status: Date Acquired: 02/16/2021 Comorbid Cataracts, Glaucoma, Anemia, Hypertension, Type II Weeks Of Treatment: 3 History: Diabetes, End Stage Renal Disease, Neuropathy Clustered Wound: No Photos Photo Uploaded By: Georges Mouse, Minus Breeding on 03/12/2021 17:04:52 Wound Measurements Length: (cm) 4.2 Width: (cm) 2 Depth: (cm) 0.1 Area: (cm) 6.597 Volume: (cm) 0.66 % Reduction in Area: 4.6% % Reduction in Volume: 4.5% Epithelialization: None Tunneling: No Undermining: No Wound Description Classification: Unclassifiable Exudate Amount: None Present Foul Odor After Cleansing: No Slough/Fibrino No Wound Bed Granulation Amount: None Present (0%) Exposed Structure Necrotic Amount: Large (67-100%) Fascia Exposed: No Necrotic Quality: Eschar Fat Layer (Subcutaneous Tissue) Exposed: No Tendon Exposed: No Muscle Exposed: No Joint Exposed: No Bone Exposed: No Treatment Notes Wound #9 (Calf) Wound Laterality: Left, Lateral Cleanser Peri-Wound Care Topical Betadine Amy Moses, Amy Moses ([id number]) Primary Dressing Secondary Dressing Secured With Compression Wrap Compression Stockings Add-Ons Electronic Signature(s) Signed: 03/12/2021 5:00:25 PM By: Georges Mouse, Minus Breeding RN Entered By: Georges Mouse, Minus Breeding on 03/12/2021 11:43:41 Amy Moses ([id number]) -------------------------------------------------------------------------------- Vitals Details Patient Name: Amy Moses Date of Service: 03/12/2021 10:45 AM Medical Record Number: [id number] Patient Account Number: [id number] Date of Birth/Sex: [birth year] (50 y.o. F) Treating RN: Dolan Amen Primary Care Jisele Price: Zenon Mayo Other Clinician: Referring Bren Borys: Zenon Mayo Treating Mallarie Voorhies/Extender: Skipper Cliche in Treatment: 14 Vital Signs Time Taken: 11:04 Temperature (F): 97.9 Height (in): 66 Pulse (bpm): 74 Weight (lbs): 235 Respiratory Rate (breaths/min): 18 Body Mass Index (BMI): 37.9 Blood Pressure (mmHg): 152/82 Reference Range: 80 - 120 mg / dl Electronic Signature(s) Signed: 03/12/2021 5:00:25 PM By: Georges Mouse, Minus Breeding RN Entered By: Georges Mouse, Minus Breeding on 03/12/2021 11:06:27

## 2021-03-18 ENCOUNTER — Ambulatory Visit (HOSPITAL_COMMUNITY)
Admission: RE | Admit: 2021-03-18 | Discharge: 2021-03-18 | Disposition: A | Discharge status: Home / Self Care | Payer: Medicaid - Out of State | Source: Ambulatory Visit | Attending: Surgery | Admitting: Surgery

## 2021-03-18 ENCOUNTER — Other Ambulatory Visit: Payer: Self-pay

## 2021-03-18 DIAGNOSIS — R609 Edema, unspecified: Secondary | ICD-10-CM | POA: Insufficient documentation

## 2021-03-18 NOTE — Progress Notes (Signed)
Right upper extremity venous duplex completed. Refer to "CV Proc" under chart review to view preliminary results.  03/18/2021 2:51 PM Kelby Aline., MHA, RVT, RDCS, RDMS

## 2021-03-26 ENCOUNTER — Ambulatory Visit: Payer: Medicaid - Out of State | Admitting: Physician Assistant

## 2021-03-30 ENCOUNTER — Encounter (HOSPITAL_COMMUNITY): Payer: Self-pay | Admitting: Family Medicine

## 2021-03-30 ENCOUNTER — Other Ambulatory Visit: Payer: Self-pay

## 2021-03-30 ENCOUNTER — Inpatient Hospital Stay (HOSPITAL_COMMUNITY)
Admission: EM | Admit: 2021-03-30 | Discharge: 2021-04-02 | DRG: 640 | Disposition: A | Discharge status: Home / Self Care | Payer: Medicaid - Out of State | Source: Other Acute Inpatient Hospital | Attending: Internal Medicine | Admitting: Internal Medicine

## 2021-03-30 DIAGNOSIS — Z888 Allergy status to other drugs, medicaments and biological substances status: Secondary | ICD-10-CM

## 2021-03-30 DIAGNOSIS — I5032 Chronic diastolic (congestive) heart failure: Secondary | ICD-10-CM | POA: Diagnosis present

## 2021-03-30 DIAGNOSIS — Z841 Family history of disorders of kidney and ureter: Secondary | ICD-10-CM

## 2021-03-30 DIAGNOSIS — E119 Type 2 diabetes mellitus without complications: Secondary | ICD-10-CM

## 2021-03-30 DIAGNOSIS — R778 Other specified abnormalities of plasma proteins: Secondary | ICD-10-CM | POA: Diagnosis present

## 2021-03-30 DIAGNOSIS — D631 Anemia in chronic kidney disease: Secondary | ICD-10-CM | POA: Diagnosis present

## 2021-03-30 DIAGNOSIS — R197 Diarrhea, unspecified: Secondary | ICD-10-CM | POA: Diagnosis not present

## 2021-03-30 DIAGNOSIS — I132 Hypertensive heart and chronic kidney disease with heart failure and with stage 5 chronic kidney disease, or end stage renal disease: Secondary | ICD-10-CM | POA: Diagnosis present

## 2021-03-30 DIAGNOSIS — L8990 Pressure ulcer of unspecified site, unspecified stage: Secondary | ICD-10-CM | POA: Insufficient documentation

## 2021-03-30 DIAGNOSIS — H5461 Unqualified visual loss, right eye, normal vision left eye: Secondary | ICD-10-CM | POA: Diagnosis present

## 2021-03-30 DIAGNOSIS — E1165 Type 2 diabetes mellitus with hyperglycemia: Secondary | ICD-10-CM

## 2021-03-30 DIAGNOSIS — E1122 Type 2 diabetes mellitus with diabetic chronic kidney disease: Secondary | ICD-10-CM

## 2021-03-30 DIAGNOSIS — Z8249 Family history of ischemic heart disease and other diseases of the circulatory system: Secondary | ICD-10-CM

## 2021-03-30 DIAGNOSIS — L89322 Pressure ulcer of left buttock, stage 2: Secondary | ICD-10-CM | POA: Diagnosis present

## 2021-03-30 DIAGNOSIS — E039 Hypothyroidism, unspecified: Secondary | ICD-10-CM | POA: Diagnosis present

## 2021-03-30 DIAGNOSIS — Z79890 Hormone replacement therapy (postmenopausal): Secondary | ICD-10-CM

## 2021-03-30 DIAGNOSIS — E785 Hyperlipidemia, unspecified: Secondary | ICD-10-CM | POA: Diagnosis present

## 2021-03-30 DIAGNOSIS — I10 Essential (primary) hypertension: Secondary | ICD-10-CM | POA: Diagnosis present

## 2021-03-30 DIAGNOSIS — R509 Fever, unspecified: Secondary | ICD-10-CM | POA: Diagnosis not present

## 2021-03-30 DIAGNOSIS — I5033 Acute on chronic diastolic (congestive) heart failure: Secondary | ICD-10-CM | POA: Diagnosis present

## 2021-03-30 DIAGNOSIS — Z9049 Acquired absence of other specified parts of digestive tract: Secondary | ICD-10-CM

## 2021-03-30 DIAGNOSIS — Z794 Long term (current) use of insulin: Secondary | ICD-10-CM

## 2021-03-30 DIAGNOSIS — E871 Hypo-osmolality and hyponatremia: Secondary | ICD-10-CM | POA: Diagnosis present

## 2021-03-30 DIAGNOSIS — L89622 Pressure ulcer of left heel, stage 2: Secondary | ICD-10-CM | POA: Diagnosis present

## 2021-03-30 DIAGNOSIS — L89312 Pressure ulcer of right buttock, stage 2: Secondary | ICD-10-CM | POA: Diagnosis present

## 2021-03-30 DIAGNOSIS — Z20822 Contact with and (suspected) exposure to covid-19: Secondary | ICD-10-CM | POA: Diagnosis present

## 2021-03-30 DIAGNOSIS — N186 End stage renal disease: Secondary | ICD-10-CM | POA: Diagnosis present

## 2021-03-30 DIAGNOSIS — E118 Type 2 diabetes mellitus with unspecified complications: Secondary | ICD-10-CM

## 2021-03-30 DIAGNOSIS — E8770 Fluid overload, unspecified: Secondary | ICD-10-CM | POA: Diagnosis present

## 2021-03-30 DIAGNOSIS — IMO0002 Reserved for concepts with insufficient information to code with codable children: Secondary | ICD-10-CM

## 2021-03-30 DIAGNOSIS — E782 Mixed hyperlipidemia: Secondary | ICD-10-CM | POA: Diagnosis present

## 2021-03-30 DIAGNOSIS — Z79899 Other long term (current) drug therapy: Secondary | ICD-10-CM | POA: Diagnosis not present

## 2021-03-30 DIAGNOSIS — Z992 Dependence on renal dialysis: Secondary | ICD-10-CM | POA: Diagnosis not present

## 2021-03-30 DIAGNOSIS — E6609 Other obesity due to excess calories: Secondary | ICD-10-CM

## 2021-03-30 DIAGNOSIS — Z833 Family history of diabetes mellitus: Secondary | ICD-10-CM

## 2021-03-30 HISTORY — DX: Chronic diastolic (congestive) heart failure: I50.32

## 2021-03-30 LAB — COMPREHENSIVE METABOLIC PANEL
ALT: 23 U/L (ref 0–44)
AST: 29 U/L (ref 15–41)
Albumin: 2.4 g/dL — ABNORMAL LOW (ref 3.5–5.0)
Alkaline Phosphatase: 178 U/L — ABNORMAL HIGH (ref 38–126)
Anion gap: 22 — ABNORMAL HIGH (ref 5–15)
BUN: 54 mg/dL — ABNORMAL HIGH (ref 6–20)
CO2: 21 mmol/L — ABNORMAL LOW (ref 22–32)
Calcium: 9.1 mg/dL (ref 8.9–10.3)
Chloride: 89 mmol/L — ABNORMAL LOW (ref 98–111)
Creatinine, Ser: 6.27 mg/dL — ABNORMAL HIGH (ref 0.44–1.00)
GFR, Estimated: 8 mL/min — ABNORMAL LOW (ref >60)
Glucose, Bld: 293 mg/dL — ABNORMAL HIGH (ref 70–99)
Potassium: 3.8 mmol/L (ref 3.5–5.1)
Sodium: 132 mmol/L — ABNORMAL LOW (ref 135–145)
Total Bilirubin: 1.3 mg/dL — ABNORMAL HIGH (ref 0.3–1.2)
Total Protein: 5.7 g/dL — ABNORMAL LOW (ref 6.5–8.1)

## 2021-03-30 LAB — CBC
HCT: 30.3 % — ABNORMAL LOW (ref 36.0–46.0)
Hemoglobin: 9.4 g/dL — ABNORMAL LOW (ref 12.0–15.0)
MCH: 26.3 pg (ref 26.0–34.0)
MCHC: 31 g/dL (ref 30.0–36.0)
MCV: 84.9 fL (ref 80.0–100.0)
Platelets: 267 10*3/uL (ref 150–400)
RBC: 3.57 MIL/uL — ABNORMAL LOW (ref 3.87–5.11)
RDW: 16.5 % — ABNORMAL HIGH (ref 11.5–15.5)
WBC: 14.4 10*3/uL — ABNORMAL HIGH (ref 4.0–10.5)
nRBC: 0 % (ref 0.0–0.2)

## 2021-03-30 LAB — PROCALCITONIN: Procalcitonin: 52.53 ng/mL

## 2021-03-30 LAB — MRSA PCR SCREENING: MRSA by PCR: NEGATIVE

## 2021-03-30 LAB — BRAIN NATRIURETIC PEPTIDE: B Natriuretic Peptide: 1068.9 pg/mL — ABNORMAL HIGH (ref 0.0–100.0)

## 2021-03-30 LAB — GLUCOSE, CAPILLARY
Glucose-Capillary: 281 mg/dL — ABNORMAL HIGH (ref 70–99)
Glucose-Capillary: 232 mg/dL — ABNORMAL HIGH (ref 70–99)

## 2021-03-30 LAB — TROPONIN I (HIGH SENSITIVITY)
Troponin I (High Sensitivity): 36 ng/L — ABNORMAL HIGH (ref <18)
Troponin I (High Sensitivity): 32 ng/L — ABNORMAL HIGH (ref <18)

## 2021-03-30 LAB — HEMOGLOBIN A1C
Hgb A1c MFr Bld: 7.3 % — ABNORMAL HIGH (ref 4.8–5.6)
Mean Plasma Glucose: 162.81 mg/dL

## 2021-03-30 MED ORDER — ATORVASTATIN CALCIUM 10 MG PO TABS
10.0000 mg | ORAL_TABLET | Freq: Every day | ORAL | Status: DC
  Administered 2021-03-31 – 2021-04-02 (×3): 10 mg via ORAL
  Filled 2021-03-30 (×3): qty 1

## 2021-03-30 MED ORDER — HEPARIN SODIUM (PORCINE) 5000 UNIT/ML IJ SOLN
5000.0000 [IU] | Freq: Three times a day (TID) | INTRAMUSCULAR | Status: DC
  Administered 2021-03-30 – 2021-04-02 (×5): 5000 [IU] via SUBCUTANEOUS
  Filled 2021-03-30 (×4): qty 1

## 2021-03-30 MED ORDER — SODIUM CHLORIDE 0.9% FLUSH
3.0000 mL | Freq: Two times a day (BID) | INTRAVENOUS | Status: DC
  Administered 2021-03-31 – 2021-04-01 (×4): 3 mL via INTRAVENOUS

## 2021-03-30 MED ORDER — SODIUM BICARBONATE 650 MG PO TABS
1300.0000 mg | ORAL_TABLET | Freq: Three times a day (TID) | ORAL | Status: DC
  Administered 2021-03-30 – 2021-04-02 (×6): 1300 mg via ORAL
  Filled 2021-03-30 (×8): qty 2

## 2021-03-30 MED ORDER — INSULIN ASPART 100 UNIT/ML IJ SOLN
0.0000 [IU] | Freq: Three times a day (TID) | INTRAMUSCULAR | Status: DC
  Administered 2021-03-31 – 2021-04-01 (×4): 2 [IU] via SUBCUTANEOUS

## 2021-03-30 MED ORDER — INSULIN ASPART 100 UNIT/ML IJ SOLN
0.0000 [IU] | Freq: Every day | INTRAMUSCULAR | Status: DC
  Administered 2021-03-30: 2 [IU] via SUBCUTANEOUS

## 2021-03-30 MED ORDER — SODIUM THIOSULFATE 250 MG/ML IV SOLN
25.0000 g | INTRAVENOUS | Status: AC
  Administered 2021-03-31: 25 g via INTRAVENOUS
  Filled 2021-03-30: qty 100

## 2021-03-30 MED ORDER — TORSEMIDE 20 MG PO TABS
150.0000 mg | ORAL_TABLET | Freq: Every day | ORAL | Status: DC
  Administered 2021-03-31 (×2): 150 mg via ORAL
  Filled 2021-03-30 (×2): qty 1

## 2021-03-30 MED ORDER — LEVOTHYROXINE SODIUM 50 MCG PO TABS
50.0000 ug | ORAL_TABLET | Freq: Every day | ORAL | Status: DC
  Administered 2021-03-31 – 2021-04-02 (×3): 50 ug via ORAL
  Filled 2021-03-30 (×3): qty 1

## 2021-03-30 MED ORDER — CHLORHEXIDINE GLUCONATE CLOTH 2 % EX PADS
6.0000 | MEDICATED_PAD | Freq: Every day | CUTANEOUS | Status: DC
  Administered 2021-03-31 – 2021-04-01 (×2): 6 via TOPICAL

## 2021-03-30 MED ORDER — SEVELAMER CARBONATE 800 MG PO TABS
800.0000 mg | ORAL_TABLET | Freq: Three times a day (TID) | ORAL | Status: DC
  Filled 2021-03-30 (×3): qty 1

## 2021-03-30 MED ORDER — ONDANSETRON HCL 4 MG PO TABS: 4.0000 mg | ORAL_TABLET | Freq: Four times a day (QID) | ORAL | Status: DC | PRN

## 2021-03-30 MED ORDER — ACETAMINOPHEN 650 MG RE SUPP: 650.0000 mg | Freq: Four times a day (QID) | RECTAL | Status: DC | PRN

## 2021-03-30 MED ORDER — METOPROLOL SUCCINATE ER 50 MG PO TB24
50.0000 mg | ORAL_TABLET | Freq: Every day | ORAL | Status: DC
  Administered 2021-03-30 – 2021-04-01 (×3): 50 mg via ORAL
  Filled 2021-03-30 (×3): qty 1

## 2021-03-30 MED ORDER — ACETAMINOPHEN 325 MG PO TABS
650.0000 mg | ORAL_TABLET | Freq: Four times a day (QID) | ORAL | Status: DC | PRN
  Administered 2021-03-30: 650 mg via ORAL
  Filled 2021-03-30: qty 2

## 2021-03-30 MED ORDER — SODIUM CHLORIDE 0.9% FLUSH
3.0000 mL | INTRAVENOUS | Status: DC | PRN
Start: 2021-03-30 — End: 2021-04-02
  Administered 2021-03-31: 3 mL via INTRAVENOUS

## 2021-03-30 MED ORDER — INSULIN GLARGINE 100 UNIT/ML ~~LOC~~ SOLN
50.0000 [IU] | Freq: Every day | SUBCUTANEOUS | Status: DC
  Administered 2021-03-31 – 2021-04-01 (×3): 50 [IU] via SUBCUTANEOUS
  Filled 2021-03-30 (×4): qty 0.5

## 2021-03-30 MED ORDER — METOPROLOL TARTRATE 5 MG/5ML IV SOLN: 5.0000 mg | Freq: Four times a day (QID) | INTRAVENOUS | Status: DC | PRN

## 2021-03-30 MED ORDER — SODIUM CHLORIDE 0.9 % IV SOLN: 250.0000 mL | INTRAVENOUS | Status: DC | PRN

## 2021-03-30 MED ORDER — SODIUM CHLORIDE 0.9% FLUSH
3.0000 mL | Freq: Two times a day (BID) | INTRAVENOUS | Status: DC
  Administered 2021-04-01: 3 mL via INTRAVENOUS

## 2021-03-30 MED ORDER — ONDANSETRON HCL 4 MG/2ML IJ SOLN: 4.0000 mg | Freq: Four times a day (QID) | INTRAMUSCULAR | Status: DC | PRN

## 2021-03-30 NOTE — Consult Note (Signed)
Reason for Consult: Continuity of ESRD care Referring Physician: Vance Gather MD [facility])  HPI:  50 year old African-American woman with past medical history significant for hypertension and type 2 diabetes mellitus with consequent end-stage renal disease on hemodialysis (TTS schedule, [facility] [place], [place]) since November, 2021.  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  She presented to [name] yesterday with concerns of thigh swelling and a sensation of orthopnea related to decreasing urine output that she has also noticed.  Also complains of nasal/sinus congestion with postnasal drip and a cough productive of whitish phlegm with some subjective fever but no chills.  She denies any hemoptysis or skin rash and has not had any contact with known COVID-19 patients.  She denied any dysuria, urgency, frequency or flank pain.  She denied any facial swelling.  She was transferred to [facility] for dialysis service/advanced care.  Past Medical History:  Diagnosis Date  . Anemia   . 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 Diabetes mellitus, type II ([place])   . Dyslipidemia   . Glaucoma   . Hypertension   . Hypothyroidism (acquired)   . Kidney disease    Stage 5  . Pneumonia     Past Surgical History:  Procedure Laterality Date  . ABDOMINAL AORTOGRAM W/LOWER EXTREMITY Bilateral 12/18/2020   Procedure: ABDOMINAL AORTOGRAM W/LOWER EXTREMITY;  Surgeon: Elam Dutch, MD;  Location: [place] CV LAB;  Service: Cardiovascular;  Laterality: Bilateral;  . ANKLE FRACTURE SURGERY    . AV FISTULA PLACEMENT Left 08/18/2020   Procedure: LEFT ARM BRACHIOCEPHALIC ARTERIOVENOUS (AV) FISTULA CREATION;  Surgeon: Elam Dutch, MD;  Location: [place];  Service: Vascular;  Laterality: Left;  . CESAREAN SECTION    . CHOLECYSTECTOMY     . EYE SURGERY     Vatrectomy  . IR PERC TUN PERIT CATH WO PORT S&I Dartha Lodge  09/15/2020  . IR REMOVAL TUN CV CATH W/O FL  02/19/2021  . IR US GUIDE VASC ACCESS RIGHT  09/15/2020  . TOE SURGERY      Family History  Problem Relation Age of Onset  . Heart disease Mother   . Diabetes Mother   . Kidney disease Mother   . Diabetes Father   . Heart disease Father   . Diabetes Brother     Social History:  reports that she has never smoked. She has never used smokeless tobacco. She reports that she does not drink alcohol and does not use drugs.  Allergies:  Allergies  Allergen Reactions  . Ace Inhibitors Cough    Medications:  Scheduled: . atorvastatin  10 mg Oral Daily  . heparin  5,000 Units Subcutaneous Q8H  . [START ON 03/31/2021] insulin aspart  0-15 Units Subcutaneous TID WC  . insulin aspart  0-5 Units Subcutaneous QHS  . insulin glargine  50 Units Subcutaneous QHS  . [START ON 03/31/2021] levothyroxine  50 mcg Oral QAC breakfast  . metoprolol succinate  50 mg Oral QHS  . [START ON 03/31/2021] sevelamer carbonate  800 mg Oral TID WC  . sodium bicarbonate  1,300 mg Oral TID  . sodium chloride flush  3 mL Intravenous Q12H  . sodium chloride flush  3 mL Intravenous Q12H  . torsemide  150 mg Oral Daily    BMP Latest Ref Rng & Units 03/30/2021 02/18/2021 12/18/2020  Glucose 70 - 99 mg/dL 293(H) 262(H) 123(H)  BUN 6 - 20 mg/dL 54(H) 41(H) 34(H)  Creatinine 0.44 - 1.00 mg/dL 6.27(H) 5.13(H) 4.40(H)  Sodium 135 - 145 mmol/L 132(L) 138 135  Potassium 3.5 - 5.1 mmol/L 3.8 4.0 3.7  Chloride 98 - 111 mmol/L 89(L) 98 97(L)  CO2 22 - 32 mmol/L 21(L) 23 -  Calcium 8.9 - 10.3 mg/dL 9.1 10.0 -   CBC Latest Ref Rng & Units 03/30/2021 02/18/2021 12/18/2020  WBC 4.0 - 10.5 K/uL 14.4(H) 12.3(H) -  Hemoglobin 12.0 - 15.0 g/dL 9.4(L) 8.5(L) 10.5(L)  Hematocrit 36.0 - 46.0 % 30.3(L) 28.9(L) 31.0(L)  Platelets 150 - 400 K/uL 267 333 -   No results found.  Review of Systems  Constitutional:  Positive for fatigue and fever. Negative for chills.  HENT: Positive for congestion, postnasal drip and sinus pressure. Negative for trouble swallowing.   Eyes: Negative for pain, discharge and redness.  Respiratory: Positive for cough and shortness of breath. Negative for apnea and chest tightness.        Orthopnea  Cardiovascular: Positive for leg swelling.  Gastrointestinal: Negative for abdominal pain, blood in stool and diarrhea.  Genitourinary: Positive for decreased urine volume. Negative for dysuria, flank pain, hematuria and urgency.  Musculoskeletal: Negative for back pain, joint swelling and neck pain.  Skin: Positive for wound.       Calciphylaxis of the thighs with wound VAC in situ  Neurological: Positive for headaches. Negative for dizziness, tremors, seizures and speech difficulty.   Blood pressure (!) 106/59, pulse 84, temperature 98.5 F (36.9 C), temperature source Oral, resp. rate 19, weight 88.5 kg, last menstrual period 08/22/2015, SpO2 98 %. Physical Exam Vitals and nursing note reviewed.  Constitutional:      General: She is not in acute distress.    Appearance: Normal appearance. She is ill-appearing.  HENT:     Head: Normocephalic and atraumatic.     Right Ear: External ear normal.     Left Ear: External ear normal.     Nose: Nose normal.     Mouth/Throat:     Mouth: Mucous membranes are dry.     Pharynx: Oropharynx is clear. No oropharyngeal exudate.  Eyes:     General: No scleral icterus.    Comments: Right eye shut-unilaterally blind  Cardiovascular:     Rate and Rhythm: Normal rate and regular rhythm.     Pulses: Normal pulses.     Heart sounds: Normal heart sounds. No murmur heard.   Pulmonary:     Effort: Pulmonary effort is normal.     Breath sounds: Normal breath sounds. No wheezing.  Abdominal:     General: Abdomen is flat.     Palpations: Abdomen is soft.     Tenderness: There is no abdominal tenderness. There is no guarding.   Musculoskeletal:        General: Normal range of motion.     Cervical back: Normal range of motion and neck supple.     Right lower leg: Edema present.     Left lower leg: Edema present.     Comments: 1-2+ pitting pretibial edema.  Bilateral thigh calciphylaxis wounds with wound VAC in situ.  Left upper arm brachiocephalic fistula with poor thrill  Lymphadenopathy:     Cervical: No cervical adenopathy.  Skin:    General: Skin is warm and dry.  Neurological:     Mental Status: She is alert and oriented to person, place, and time.  Psychiatric:  Mood and Affect: Mood normal.     Assessment/Plan: 1.  Volume overload volume overload: Likely related to diminishing urine output possibly with loss of lean muscle mass related to ongoing wound healing.  She is not in extremis at this time and appears to be saturating well on room air.  Will order for hemodialysis tomorrow. 2.  End-stage renal disease: Unfortunately, she is unaware of her dry weight and we will attempt to reestablish this by challenging it with dialysis here while in the hospital.  She will need records obtained from her outpatient dialysis unit when it opens tomorrow.  She will get hemodialysis tomorrow off schedule and then resume TTS schedule on Thursday. 3.  Calciphylaxis: With bilateral thigh ulcers and currently with wound VAC in situ.  Resume sodium thiosulfate and known calcium containing binders.  Avoid VDRA. 4.  Anemia: Likely with significant inflammatory complex/ESA resistance in the setting of ongoing calciphylaxis ulcers and wound VAC losses.  Titrate ESA. 5.  Hypertension: Blood pressure within acceptable range, continue current management. 6.  Hyponatremia: Secondary to volume excess/overload, monitor with hemodialysis.  Continue oral fluid restriction.  Julanne Schlueter K. 03/30/2021, 6:38 PM

## 2021-03-30 NOTE — Progress Notes (Signed)
NEW ADMISSION NOTE New Admission Note:   Arrival Method: Patient arrived from [facility] ED in ambulance. Mental Orientation: Alert and oriented x4 Telemetry: N/A Assessment: Completed Skin: dry skin all over the body, pressure ulcers noted on bilateral buttocks, right heel, and also wound vacs on bilateral thighs for calciphylaxis. IV:  Right AC, SL. Pain: Denies any pain. Tubes: N/a Safety Measures: Safety Fall Prevention Plan has been given, discussed and signed Admission: Completed [facility] Orientation: Patient has been orientated to the room, unit and staff.  Family: None.  Orders have been reviewed and implemented. Will continue to monitor the patient. Call light has been placed within reach and bed alarm has been activated.   Amaryllis Dyke, RN

## 2021-03-30 NOTE — H&P (Signed)
History and Physical   Angelynn [facility] [id number] DOB: May 05, 1971 DOA: 03/30/2021  Referring MD/NP/PA: Dr. Anabel Halon, [place] [facility] PCP: Leeanne Rio, MD  Patient coming from: Home by way of [facility] ED  Chief Complaint: Fever  HPI: Ottis Critcher is a 50 y.o. female with a history of ESRD, T2DM, HLD, hypothyroidism, calciphylaxis with wound vac who presented to the ED 5/10 at 5am with a couple days of not feeling right, wooziness in the head, cough, fever to 1055F and sinus drainage for about 36 hours that remained stable, constant, and intermittently was associated with pain in the far left chest that was not positional or exertional. The pain has resolved. Also had intermittent dyspnea which is currently resolved as well. Received full HD Tx on Saturday, reports they've been trying to lower her EDW, though her legs have still been swollen and she has some orthopnea. Home health RN changed wound vac 5/9 stated wounds looked good without evidence of infection. Denies pain/redness around any wounds or fistula. No sick contacts, vaccinated x3 for covid.   ED Course: 99.55F, 94bpm, 18/min 97% on room air, BP 128/76. BNP elevated to 36,686, WBC 20.1k, hgb 10.4g/dl, hsTroponin 88, lactic acid normal at 1, Na 130, SCr 6.18, BUN 49. Respiratory viral panel was negative. CXR revealed stable enlargement of cardiopericardial silhouette without acute changes. ECG demonstrated NSR w/vent rate 85bpm.   Review of Systems: No neck stiffness or pain, no back pain, no new or worsening wounds, no vision changes (chronic right eye blindness), scotomata, abd pain, N/V/D, changes in urination (~2x/day), and per HPI. All others reviewed and are negative.   Past Medical History:  Diagnosis Date  . Anemia   . 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 Diabetes mellitus, type II ([place])   . Dyslipidemia   . Glaucoma   . Hypertension   . Hypothyroidism (acquired)   .  Kidney disease    Stage 5  . Pneumonia    Past Surgical History:  Procedure Laterality Date  . ABDOMINAL AORTOGRAM W/LOWER EXTREMITY Bilateral 12/18/2020   Procedure: ABDOMINAL AORTOGRAM W/LOWER EXTREMITY;  Surgeon: Elam Dutch, MD;  Location: [facility];  Service: Cardiovascular;  Laterality: Bilateral;  . ANKLE FRACTURE SURGERY    . AV FISTULA PLACEMENT Left 08/18/2020   Procedure: LEFT ARM BRACHIOCEPHALIC ARTERIOVENOUS (AV) FISTULA CREATION;  Surgeon: Elam Dutch, MD;  Location: [facility];  Service: Vascular;  Laterality: Left;  . CESAREAN SECTION    . CHOLECYSTECTOMY    . EYE SURGERY     Vatrectomy  . IR PERC TUN PERIT CATH WO PORT S&I [facility]  09/15/2020  . IR REMOVAL TUN CV CATH W/O FL  02/19/2021  . IR US GUIDE VASC ACCESS RIGHT  09/15/2020  . TOE SURGERY     - Nonsmoker  reports that she has never smoked. She has never used smokeless tobacco. She reports that she does not drink alcohol and does not use drugs. Allergies  Allergen Reactions  . Ace Inhibitors Cough   Family History  Problem Relation Age of Onset  . Heart disease Mother   . Diabetes Mother   . Kidney disease Mother   . Diabetes Father   . Heart disease Father   . Diabetes Brother    - Family history otherwise reviewed and not pertinent.  Prior to Admission medications   Medication Sig Start Date End Date Taking? Authorizing Provider  acetaminophen (TYLENOL) 500 MG  tablet Take 1,000 mg by mouth every 6 (six) hours as needed for moderate pain.    [provider]  ANTACID EXTRA STRENGTH 750 MG chewable tablet Chew 750 mg by mouth daily. 08/14/20   [provider]  atorvastatin (LIPITOR) 10 MG tablet Take 10 mg by mouth daily. 05/14/20   [provider]  epoetin alfa (EPOGEN) 4000 UNIT/ML injection Inject 4,000 Units into the skin every 14 (fourteen) days.  06/19/20   [provider]  HUMALOG KWIKPEN 100 UNIT/ML KwikPen Inject 5-15 Units into the skin 3 (three)  times daily with meals. 03/03/20   [provider]  HYDROcodone-acetaminophen (NORCO) 5-325 MG tablet Take 1 tablet by mouth every 6 (six) hours as needed for moderate pain. Patient not taking: No sig reported 11/19/20   Dagoberto Ligas, PA-C  insulin glargine (LANTUS) 100 UNIT/ML injection Inject 50 Units into the skin at bedtime.    [provider]  levothyroxine (SYNTHROID) 50 MCG tablet Take 50 mcg by mouth daily before breakfast.    [provider]  metoprolol succinate (TOPROL-XL) 50 MG 24 hr tablet Take 50 mg by mouth at bedtime. Take with or immediately following a meal.    [provider]  sevelamer carbonate (RENVELA) 800 MG tablet Take 800 mg by mouth 3 (three) times daily with meals. 10/14/20   [provider]  sodium bicarbonate 650 MG tablet Take 1,300 mg by mouth 3 (three) times daily.    [provider]  torsemide (DEMADEX) 100 MG tablet Take 150 mg by mouth daily.  05/22/20   [provider]    Physical Exam: Vitals:   03/30/21 1528  BP: (!) 106/59  Pulse: 84  Resp: 19  Temp: 98.5 F (36.9 C)  TempSrc: Oral  SpO2: 98%  Weight: 88.5 kg   Constitutional: 50 y.o. female in no distress, calm demeanor Eyes: Lids and conjunctivae normal R eye absent, left PERRL ENMT: Mucous membranes are moist. Posterior pharynx clear of any exudate or lesions. Fair dentition.  Neck: normal, supple, no masses, no thyromegaly Respiratory: Non-labored breathing room air without accessory muscle use. Clear breath sounds to auscultation bilaterally Cardiovascular: Regular rate and rhythm, II/VI SEM without rub. No carotid bruits. No JVD. 1+ woody LE edema. Palpable pedal pulses. Abdomen: Normoactive bowel sounds. No tenderness, non-distended, and no masses palpated. No hepatosplenomegaly. GU: No indwelling catheter Musculoskeletal: No clubbing / cyanosis. No joint deformity upper and lower extremities. Good ROM, no contractures. Normal  muscle tone.  Skin: Warm, dry. Scattered wounds as follows: Right heel with black eschar overlaying granulation tissue without fluctuance or erythema or induration. Bilateral lower buttocks/superior posterior thighs bilaterally with shallow ulcerations without purulence or erythema or induration. Anterior bilateral medial thighs with wound vac in place over wounds without purulence or erythema. Neurologic: CN II-XII grossly intact. Speech normal. No focal deficits in motor strength or sensation in all extremities.  Psychiatric: Alert and oriented x3. Normal judgment and insight. Mood euthymic with congruent affect.   Labs on Admission: I have personally reviewed following labs and imaging studies  Basic Metabolic Panel: CBG: Recent Labs  Lab 03/30/21 1640  GLUCAP 281*    Radiological Exams on Admission: No results found.  EKG: Independently reviewed. NSR rightward axis, no ST depression or elevation. Voltage normal not suggestive of pericardial attenuation.  Assessment/Plan Active Problems:   Uncontrolled type 2 diabetes mellitus with chronic kidney disease, with long-term current use of insulin (HCC)   Hyperlipidemia   Essential hypertension, benign  End stage renal disease (HCC)   Acute on chronic heart failure with preserved ejection fraction (HFpEF) (HCC)   Pressure injury of skin   Fever: Symptoms of URI suggest viral etiology though viral panel negative. Pt vaccinated, boosted for covid and w/negative test this AM.  - No meningismus, dialysis access appears normal, no symptoms of UTI, no pulmonary infiltrate. Wounds are extensive as detailed above but none appear to have cellulitis or purulence.  - Blood cultures - Monitor off abx for now with low threshold for broad coverage.   Hypervolemia, Acute on chronic HFpEF:  - Update echocardiogram - Manage volume with HD, continue demadex '150mg'$  daily start tonight as pt makes some urine. Continue metoprolol  HLD:  - Continue  statin  Calciphylaxis: With wound vac on bilateral thighs.  - Continue wound vac. WOC consulted.   Pressure injury of right heel and bilateral buttocks: None appear grossly infection. Followed [facility] getting debridements of right heel. - Offloading recommended.  - WOC consulted  ESRD: - Nephrology, Dr. Moshe Cipro and Dr. Posey Pronto, notified of admission and need for dialysis (due today on TTS schedule) - Continue sevelamer and sodium bicarbonate  Chest pain, troponin elevation: CP resolved, nonischemic ECGs reviewed in paper chart. Tn only mildly elevated initially.  - Delta troponin  IDT2DM uncontrolled with hyperglycemia:  - Continue lantus 50u qHS, add moderate SSI based on home dose of 5-15 TIDWC  Hypothyroidism:  - Continue synthroid  DVT prophylaxis: Heparin  Code Status: Full  Family Communication: None at bedside Disposition Plan: Home after admission Consults called: Nephrology, [place]  Admission status: Inpatient    [name] Pour, MD [facility] [email or web address] 03/30/2021, 4:45 PM

## 2021-03-31 ENCOUNTER — Other Ambulatory Visit (HOSPITAL_COMMUNITY): Payer: Medicaid - Out of State

## 2021-03-31 DIAGNOSIS — R509 Fever, unspecified: Secondary | ICD-10-CM

## 2021-03-31 LAB — BASIC METABOLIC PANEL
Anion gap: 19 — ABNORMAL HIGH (ref 5–15)
BUN: 58 mg/dL — ABNORMAL HIGH (ref 6–20)
CO2: 18 mmol/L — ABNORMAL LOW (ref 22–32)
Calcium: 9.4 mg/dL (ref 8.9–10.3)
Chloride: 96 mmol/L — ABNORMAL LOW (ref 98–111)
Creatinine, Ser: 6.48 mg/dL — ABNORMAL HIGH (ref 0.44–1.00)
GFR, Estimated: 7 mL/min — ABNORMAL LOW (ref >60)
Glucose, Bld: 103 mg/dL — ABNORMAL HIGH (ref 70–99)
Potassium: 3.9 mmol/L (ref 3.5–5.1)
Sodium: 133 mmol/L — ABNORMAL LOW (ref 135–145)

## 2021-03-31 LAB — CBC
HCT: 30.3 % — ABNORMAL LOW (ref 36.0–46.0)
Hemoglobin: 9.4 g/dL — ABNORMAL LOW (ref 12.0–15.0)
MCH: 26.6 pg (ref 26.0–34.0)
MCHC: 31 g/dL (ref 30.0–36.0)
MCV: 85.6 fL (ref 80.0–100.0)
Platelets: 251 10*3/uL (ref 150–400)
RBC: 3.54 MIL/uL — ABNORMAL LOW (ref 3.87–5.11)
RDW: 16.2 % — ABNORMAL HIGH (ref 11.5–15.5)
WBC: 12.5 10*3/uL — ABNORMAL HIGH (ref 4.0–10.5)
nRBC: 0 % (ref 0.0–0.2)

## 2021-03-31 LAB — GLUCOSE, CAPILLARY
Glucose-Capillary: 143 mg/dL — ABNORMAL HIGH (ref 70–99)
Glucose-Capillary: 150 mg/dL — ABNORMAL HIGH (ref 70–99)
Glucose-Capillary: 163 mg/dL — ABNORMAL HIGH (ref 70–99)

## 2021-03-31 MED ORDER — LACTINEX PO CHEW
2.0000 | CHEWABLE_TABLET | Freq: Three times a day (TID) | ORAL | Status: DC
  Administered 2021-03-31 – 2021-04-02 (×7): 2 via ORAL
  Filled 2021-03-31 (×11): qty 2

## 2021-03-31 MED ORDER — TORSEMIDE 20 MG PO TABS
150.0000 mg | ORAL_TABLET | Freq: Every day | ORAL | Status: DC
  Filled 2021-03-31 (×2): qty 1

## 2021-03-31 NOTE — Consult Note (Signed)
WOC Nurse Consult Note: Reason for Consult: bilateral buttocks, bilateral wound VACs on thighs, and wounds on right heel and left posterior calf. Attempted to see patient and evaluate wounds and convert VACs to hospital equipment.  Room is empty and I understand the patient was just taken to the hemodialysis unit.  I anticipate the patient will not return to her inpatient room [phone]) until after the [facility] staff have left for the day.  The hemodialysis unit will not allow wound care, evaluation of wounds, or VAC procedures in the unit due to increased risk for infection.  Therefore, if not able to see today, the patient will be seen tomorrow.  That would create the necessity to avoid changing the VACs again on Friday, and perform next on Monday of next week. Val Riles, RN, MSN, CWOCN, CNS-BC, pager [phone]

## 2021-03-31 NOTE — Procedures (Signed)
Patient was seen on dialysis and the procedure was supervised.  BFR 400  Via AVF BP is  131/64.   Patient appears to be tolerating treatment well- hoping for symptom improvement with HD  Amy Moses 03/31/2021

## 2021-03-31 NOTE — Progress Notes (Signed)
Subjective:  Seen on HD-  Alert, wanting something to eat.  Still feeling SOB and swollen   Objective Vital signs in last 24 hours: Vitals:   03/30/21 1528 03/30/21 2218 03/31/21 0600 03/31/21 1016  BP: (!) 106/59 107/61  119/66  Pulse: 84 72  64  Resp: '19 18  16  '$ Temp: 98.5 F (36.9 C) 97.7 F (36.5 C)  97.7 F (36.5 C)  TempSrc: Oral Oral  Oral  SpO2: 98% 99%  99%  Weight: 88.5 kg  86.9 kg    Weight change:   Intake/Output Summary (Last 24 hours) at 03/31/2021 1221 Last data filed at 03/31/2021 0900 Gross per 24 hour  Intake 240 ml  Output 0 ml  Net 240 ml   HD orders [place] [facility]  TTS 4 hours  edw 87 ( got to 86.5 on Sat) BP 119/76 AVF BFR 400 DFR 800 2 K /2.5 calc  Heparin-  yesepo 20,000, sodium thiosulfate 25 grams on renvela for binder   Assessment/ Plan: Pt is a 50 y.o. yo female with ESRD who was admitted on 03/30/2021 with sxms of volume overload   Assessment/Plan: 1.  Volume overload volume overload: Likely related to diminishing urine output possibly with loss of lean muscle mass related to ongoing wound healing.  Likely needing EDW lowered.  HD today off schedule to make up for yesterday-  Planning for 3.5 of volume removal-  Then will do HD tomorrow on schedule-  Lower EDW as able 2.  End-stage renal disease: She will get hemodialysis today off schedule and then resume TTS schedule on Thursday via AVF. 3.  Calciphylaxis: With bilateral thigh ulcers and currently with wound VAC in situ.  Resume sodium thiosulfate and known calcium containing binders.  Avoid VDRA. 4.  Anemia: Likely with significant inflammatory complex/ESA resistance in the setting of ongoing calciphylaxis ulcers and wound VAC losses.  Titrate ESA. Got 20,000 on Sat 5/7-  Does not need any more right now 5.  Hypertension: Blood pressure within acceptable range, continue current management. 6.  Hyponatremia: Secondary to volume excess/overload, monitor with hemodialysis.  Continue oral fluid  restriction.   Louis Meckel    Labs: Basic Metabolic Panel: Recent Labs  Lab 03/30/21 1658 03/31/21 0355  NA 132* 133*  K 3.8 3.9  CL 89* 96*  CO2 21* 18*  GLUCOSE 293* 103*  BUN 54* 58*  CREATININE 6.27* 6.48*  CALCIUM 9.1 9.4   Liver Function Tests: Recent Labs  Lab 03/30/21 1658  AST 29  ALT 23  ALKPHOS 178*  BILITOT 1.3*  PROT 5.7*  ALBUMIN 2.4*   No results for input(s): LIPASE, AMYLASE in the last 168 hours. No results for input(s): AMMONIA in the last 168 hours. CBC: Recent Labs  Lab 03/30/21 1658 03/31/21 0355  WBC 14.4* 12.5*  HGB 9.4* 9.4*  HCT 30.3* 30.3*  MCV 84.9 85.6  PLT 267 251   Cardiac Enzymes: No results for input(s): CKTOTAL, CKMB, CKMBINDEX, TROPONINI in the last 168 hours. CBG: Recent Labs  Lab 03/30/21 1640 03/30/21 2121 03/31/21 0809  GLUCAP 281* 232* 143*    Iron Studies: No results for input(s): IRON, TIBC, TRANSFERRIN, FERRITIN in the last 72 hours. Studies/Results: No results found. Medications: Infusions: . sodium chloride    . sodium thiosulfate infusion for calciphylaxis      Scheduled Medications: . atorvastatin  10 mg Oral Daily  . Chlorhexidine Gluconate Cloth  6 each Topical Q0600  . heparin  5,000 Units Subcutaneous Q8H  .  insulin aspart  0-15 Units Subcutaneous TID WC  . insulin aspart  0-5 Units Subcutaneous QHS  . insulin glargine  50 Units Subcutaneous QHS  . lactobacillus acidophilus & bulgar  2 tablet Oral TID  . levothyroxine  50 mcg Oral QAC breakfast  . metoprolol succinate  50 mg Oral QHS  . sevelamer carbonate  800 mg Oral TID WC  . sodium bicarbonate  1,300 mg Oral TID  . sodium chloride flush  3 mL Intravenous Q12H  . sodium chloride flush  3 mL Intravenous Q12H  . torsemide  150 mg Oral Daily    have reviewed scheduled and prn medications.  Physical Exam: General: alert, NAD Heart:RRR Lungs: mostly clear Abdomen: soft, non tender Extremities: pitting edema-  Wound vac in  place Dialysis Access: left upper arm AVF    03/31/2021,12:21 PM  LOS: 1 day

## 2021-03-31 NOTE — Progress Notes (Signed)
PROGRESS NOTE    Amy Moses   [id number]  DOB: 02/23/1971  DOA: 03/30/2021 PCP: Leeanne Rio, MD   Brief Narrative:  Amy Moses is a 50 y.o. female with a history of ESRD, T2DM, HLD, hypothyroidism, calciphylaxis with wound vac who presented to the ED 5/10 at 5am with a couple days of not feeling right, wooziness in the head, cough, fever to 102F .  She states that she always has sinus drainage and this is no different than before.  She does have a new cough which is productive of clear sputum.  She was given antibiotics initially in the ED but further antibiotics were held.   Subjective: States she is having diarrhea.  She states it started last night in the hospital and she has had 7-8 episodes by this morning.  She has no appetite and no abdominal pain.    Assessment & Plan:   Principal Problem:   Fever- cough, sinus discharge, headache, -As mentioned, she has symptoms of sinus drainage and new cough- chest x-ray is clear- I have asked her to avoid taking Tylenol and ibuprofen for her pain to allow [name] to see if she continues to have temperatures - She began to have diarrhea last night-this could be from the antibiotics she received in the ED-abdomen is nontender on exam today and there is no vomiting-we will continue to follow and see if diarrhea persists -Follow-up on cultures - WBC down to 12.5 from 14.4 -Wound vacs not removed today by wound RN-I agree with prior attending that area around the wound vacs appears clean and healthy  Active Problems:   End stage renal disease -hypervolemia- Mildly elevated Troponin - dialysis days> Tu/Thus/Sat dialysis - troponin elevation likely due to fluid overload in setting of renal failure - She being dialyzed today and will be dialyzed again tomorrow -Nephrology plans to lower EDW    Uncontrolled type 2 diabetes mellitus with chronic kidney disease, with long-term current use of insulin   -Continue Lantus sliding  scale     Hyperlipidemia - cont Lipitor    Essential hypertension, benign - cont Toprol, Demadex       Pressure injury of skin - right heel and b/l buttocks- continue wound care   Time spent in minutes: 35 DVT prophylaxis: heparin injection 5,000 Units Start: 03/30/21 2200 Code Status: Full code Family Communication:  Level of Care: Level of care: Telemetry Medical Disposition Plan:  Status is: Inpatient  Remains inpatient appropriate because:Inpatient level of care appropriate due to severity of illness   Dispo: The patient is from: Home              Anticipated d/c is to: Home              Patient currently is not medically stable to d/c.   Difficult to place patient No      Consultants:   nephrology Procedures:    Antimicrobials:  Anti-infectives (From admission, onward)   None       Objective: Vitals:   03/31/21 1530 03/31/21 1600 03/31/21 1630 03/31/21 1637  BP: 125/65 131/66 134/68 128/68  Pulse:  69 70 70  Resp:    18  Temp:      TempSrc:      SpO2:    100%  Weight:    82.8 kg    Intake/Output Summary (Last 24 hours) at 03/31/2021 1711 Last data filed at 03/31/2021 1637 Gross per 24 hour  Intake 240 ml  Output 3000 ml  Net -2760 ml   Filed Weights   03/31/21 0600 03/31/21 1230 03/31/21 1637  Weight: 86.9 kg 86.3 kg 82.8 kg    Examination: General exam: Appears comfortable  HEENT: PERRLA, oral mucosa moist, no sclera icterus or thrush Respiratory system: Clear to auscultation. Respiratory effort normal. Cardiovascular system: S1 & S2 heard, RRR.   Gastrointestinal system: Abdomen soft, non-tender, nondistended. Normal bowel sounds. Central nervous system: Alert and oriented. No focal neurological deficits. Extremities: No cyanosis, clubbing or edema Skin: No rashes or ulcers Psychiatry:  Mood & affect appropriate.     Data Reviewed: I have personally reviewed following labs and imaging studies  CBC: Recent Labs  Lab  03/30/21 1658 03/31/21 0355  WBC 14.4* 12.5*  HGB 9.4* 9.4*  HCT 30.3* 30.3*  MCV 84.9 85.6  PLT 267 [id number]   Basic Metabolic Panel: Recent Labs  Lab 03/30/21 1658 03/31/21 0355  NA 132* 133*  K 3.8 3.9  CL 89* 96*  CO2 21* 18*  GLUCOSE 293* 103*  BUN 54* 58*  CREATININE 6.27* 6.48*  CALCIUM 9.1 9.4   GFR: Estimated Creatinine Clearance: 11.6 mL/min (A) (by C-G formula based on SCr of 6.48 mg/dL (H)). Liver Function Tests: Recent Labs  Lab 03/30/21 1658  AST 29  ALT 23  ALKPHOS 178*  BILITOT 1.3*  PROT 5.7*  ALBUMIN 2.4*   No results for input(s): LIPASE, AMYLASE in the last 168 hours. No results for input(s): AMMONIA in the last 168 hours. Coagulation Profile: No results for input(s): INR, PROTIME in the last 168 hours. Cardiac Enzymes: No results for input(s): CKTOTAL, CKMB, CKMBINDEX, TROPONINI in the last 168 hours. BNP (last 3 results) No results for input(s): PROBNP in the last 8760 hours. HbA1C: Recent Labs    03/30/21 1841  HGBA1C 7.3*   CBG: Recent Labs  Lab 03/30/21 1640 03/30/21 2121 03/31/21 0809  GLUCAP 281* 232* 143*   Lipid Profile: No results for input(s): CHOL, HDL, LDLCALC, TRIG, CHOLHDL, LDLDIRECT in the last 72 hours. Thyroid Function Tests: No results for input(s): TSH, T4TOTAL, FREET4, T3FREE, THYROIDAB in the last 72 hours. Anemia Panel: No results for input(s): VITAMINB12, FOLATE, FERRITIN, TIBC, IRON, RETICCTPCT in the last 72 hours. Urine analysis: No results found for: COLORURINE, APPEARANCEUR, LABSPEC, PHURINE, GLUCOSEU, HGBUR, BILIRUBINUR, KETONESUR, PROTEINUR, UROBILINOGEN, NITRITE, LEUKOCYTESUR Sepsis Labs: '@LABRCNTIP'$ (procalcitonin:4,lacticidven:4) ) Recent Results (from the past 240 hour(s))  Culture, blood (routine x 2)     Status: None (Preliminary result)   Collection Time: 03/30/21  4:58 PM   Specimen: BLOOD RIGHT FOREARM  Result Value Ref Range Status   Specimen Description BLOOD RIGHT FOREARM  Final    Special Requests   Final    BOTTLES DRAWN AEROBIC AND ANAEROBIC Blood Culture adequate volume   Culture   Final    NO GROWTH < 12 HOURS Performed at [facility], [address] [place], [place] [address]    Report Status PENDING  Incomplete  Culture, blood (routine x 2)     Status: None (Preliminary result)   Collection Time: 03/30/21  5:07 PM   Specimen: BLOOD RIGHT HAND  Result Value Ref Range Status   Specimen Description BLOOD RIGHT HAND  Final   Special Requests   Final    BOTTLES DRAWN AEROBIC AND ANAEROBIC Blood Culture adequate volume   Culture   Final    NO GROWTH < 12 HOURS Performed at [facility], [place] [address], [place], [place] [address]    Report Status  PENDING  Incomplete  MRSA PCR Screening     Status: None   Collection Time: 03/30/21  5:25 PM   Specimen: Nasal Mucosa; Nasopharyngeal  Result Value Ref Range Status   MRSA by PCR NEGATIVE NEGATIVE Final    Comment:        The GeneXpert MRSA Assay (FDA approved for NASAL specimens only), is one component of a comprehensive MRSA colonization surveillance program. It is not intended to diagnose MRSA infection nor to guide or monitor treatment for MRSA infections. Performed at [facility], [place] [address] [place], [place] [address]          Radiology Studies: No results found.    Scheduled Meds: . atorvastatin  10 mg Oral Daily  . Chlorhexidine Gluconate Cloth  6 each Topical Q0600  . heparin  5,000 Units Subcutaneous Q8H  . insulin aspart  0-15 Units Subcutaneous TID WC  . insulin aspart  0-5 Units Subcutaneous QHS  . insulin glargine  50 Units Subcutaneous QHS  . lactobacillus acidophilus & bulgar  2 tablet Oral TID  . levothyroxine  50 mcg Oral QAC breakfast  . metoprolol succinate  50 mg Oral QHS  . sevelamer carbonate  800 mg Oral TID WC  . sodium bicarbonate  1,300 mg Oral TID  . sodium chloride flush  3 mL Intravenous Q12H  . sodium chloride flush  3 mL Intravenous  Q12H  . torsemide  150 mg Oral Daily   Continuous Infusions: . sodium chloride       LOS: 1 day      Debbe Odea, MD [facility] Pager: [email or web address] 03/31/2021, 5:11 PM

## 2021-04-01 ENCOUNTER — Inpatient Hospital Stay (HOSPITAL_COMMUNITY): Payer: Medicaid - Out of State

## 2021-04-01 DIAGNOSIS — I5033 Acute on chronic diastolic (congestive) heart failure: Secondary | ICD-10-CM | POA: Diagnosis not present

## 2021-04-01 LAB — CBC
HCT: 32.8 % — ABNORMAL LOW (ref 36.0–46.0)
Hemoglobin: 10.2 g/dL — ABNORMAL LOW (ref 12.0–15.0)
MCH: 26.6 pg (ref 26.0–34.0)
MCHC: 31.1 g/dL (ref 30.0–36.0)
MCV: 85.4 fL (ref 80.0–100.0)
Platelets: 248 10*3/uL (ref 150–400)
RBC: 3.84 MIL/uL — ABNORMAL LOW (ref 3.87–5.11)
RDW: 16.6 % — ABNORMAL HIGH (ref 11.5–15.5)
WBC: 8.8 10*3/uL (ref 4.0–10.5)
nRBC: 0 % (ref 0.0–0.2)

## 2021-04-01 LAB — ECHOCARDIOGRAM COMPLETE
AR max vel: 1.22 cm2
AV Area VTI: 1.3 cm2
AV Area mean vel: 1.19 cm2
AV Mean grad: 7 mmHg
AV Peak grad: 14.7 mmHg
Ao pk vel: 1.92 m/s
Area-P 1/2: 4.6 cm2
MV VTI: 1.53 cm2
S' Lateral: 3.4 cm
Weight: 2920.65 oz

## 2021-04-01 LAB — GLUCOSE, CAPILLARY
Glucose-Capillary: 91 mg/dL (ref 70–99)
Glucose-Capillary: 130 mg/dL — ABNORMAL HIGH (ref 70–99)
Glucose-Capillary: 99 mg/dL (ref 70–99)
Glucose-Capillary: 132 mg/dL — ABNORMAL HIGH (ref 70–99)
Glucose-Capillary: 150 mg/dL — ABNORMAL HIGH (ref 70–99)

## 2021-04-01 LAB — HEPATITIS B SURFACE ANTIGEN: Hepatitis B Surface Ag: NONREACTIVE

## 2021-04-01 LAB — HEPATITIS B SURFACE ANTIBODY,QUALITATIVE: Hep B S Ab: NONREACTIVE

## 2021-04-01 MED ORDER — SODIUM BICARBONATE 650 MG PO TABS: 1300.0000 mg | ORAL_TABLET | Freq: Three times a day (TID) | ORAL | Status: DC

## 2021-04-01 MED ORDER — HEPARIN SODIUM (PORCINE) 1000 UNIT/ML DIALYSIS: 20.0000 [IU]/kg | INTRAMUSCULAR | Status: DC | PRN

## 2021-04-01 MED ORDER — CHLORHEXIDINE GLUCONATE CLOTH 2 % EX PADS: 6.0000 | MEDICATED_PAD | Freq: Every day | CUTANEOUS | Status: DC

## 2021-04-01 MED ORDER — SODIUM THIOSULFATE 250 MG/ML IV SOLN
25.0000 g | INTRAVENOUS | Status: DC
  Administered 2021-04-01: 25 g via INTRAVENOUS
  Filled 2021-04-01: qty 100

## 2021-04-01 NOTE — Progress Notes (Signed)
Subjective:  HD yest-  Removed 3 liters tolerated well - post weight 82.8   Objective Vital signs in last 24 hours: Vitals:   03/31/21 1726 03/31/21 2113 04/01/21 0613 04/01/21 0923  BP: 120/63 123/69 125/70 134/77  Pulse: 72 70 67 68  Resp: '17 18 18 18  '$ Temp:  98 F (36.7 C) 97.9 F (36.6 C) 98 F (36.7 C)  TempSrc:  Oral Oral   SpO2: 100% 98% 100% 100%  Weight:       Weight change: -2.2 kg  Intake/Output Summary (Last 24 hours) at 04/01/2021 1117 Last data filed at 04/01/2021 0900 Gross per 24 hour  Intake 120 ml  Output 3000 ml  Net -2880 ml   HD orders [place] [facility]  TTS 4 hours  edw 87 ( got to 86.5 on Sat) BP 119/76 AVF BFR 400 DFR 800 2 K /2.5 calc  Heparin-  yesepo 20,000, sodium thiosulfate 25 grams on renvela for binder   Assessment/ Plan: Pt is a 50 y.o. yo female with ESRD who was admitted on 03/30/2021 with sxms of volume overload   Assessment/Plan: 1.  Volume overload volume overload: Likely related to diminishing urine output possibly with loss of lean muscle mass related to ongoing wound healing.  needing EDW lowered.  HD yesterday off schedule -  another HD today on schedule-  Lower EDW as able 2.  End-stage renal disease: She will get hemodialysis yesterday off schedule and then resume TTS schedule today via AVF. 3.  Calciphylaxis: With bilateral thigh ulcers and currently with wound VAC in situ.  Resume sodium thiosulfate and no calcium containing binders.  Avoid VDRA. 4.  Anemia: Likely with significant inflammatory complex/ESA resistance in the setting of ongoing calciphylaxis ulcers and wound VAC losses.  Titrate ESA. Got 20,000 on Sat 5/7-  Does not need any more right now 5.  Hypertension: Blood pressure within acceptable range, continue current management. 6.  Hyponatremia: Secondary to volume excess/overload, monitor with hemodialysis.  Continue oral fluid restriction. She is not making urine so will not give the torsemide.  She likely does not  need sodium bicarb either but will follow bicarb level    [place]: Basic Metabolic Panel: Recent Labs  Lab 03/30/21 1658 03/31/21 0355  NA 132* 133*  K 3.8 3.9  CL 89* 96*  CO2 21* 18*  GLUCOSE 293* 103*  BUN 54* 58*  CREATININE 6.27* 6.48*  CALCIUM 9.1 9.4   Liver Function Tests: Recent Labs  Lab 03/30/21 1658  AST 29  ALT 23  ALKPHOS 178*  BILITOT 1.3*  PROT 5.7*  ALBUMIN 2.4*   No results for input(s): LIPASE, AMYLASE in the last 168 hours. No results for input(s): AMMONIA in the last 168 hours. CBC: Recent Labs  Lab 03/30/21 1658 03/31/21 0355 04/01/21 0820  WBC 14.4* 12.5* 8.8  HGB 9.4* 9.4* 10.2*  HCT 30.3* 30.3* 32.8*  MCV 84.9 85.6 85.4  PLT 267 251 248   Cardiac Enzymes: No results for input(s): CKTOTAL, CKMB, CKMBINDEX, TROPONINI in the last 168 hours. CBG: Recent Labs  Lab 03/31/21 0809 03/31/21 1723 03/31/21 2207 04/01/21 0249 04/01/21 0605  GLUCAP 143* 150* 163* 91 130*    Iron Studies: No results for input(s): IRON, TIBC, TRANSFERRIN, FERRITIN in the last 72 hours. Studies/Results: No results found. Medications: Infusions: . sodium chloride      Scheduled Medications: . atorvastatin  10 mg Oral Daily  . Chlorhexidine Gluconate Cloth  6 each Topical  Q0600  . heparin  5,000 Units Subcutaneous Q8H  . insulin aspart  0-15 Units Subcutaneous TID WC  . insulin aspart  0-5 Units Subcutaneous QHS  . insulin glargine  50 Units Subcutaneous QHS  . lactobacillus acidophilus & bulgar  2 tablet Oral TID  . levothyroxine  50 mcg Oral QAC breakfast  . metoprolol succinate  50 mg Oral QHS  . sevelamer carbonate  800 mg Oral TID WC  . sodium bicarbonate  1,300 mg Oral TID  . sodium chloride flush  3 mL Intravenous Q12H  . sodium chloride flush  3 mL Intravenous Q12H  . torsemide  150 mg Oral Daily    have reviewed scheduled and prn medications.  Physical Exam: General: alert, NAD Heart:RRR Lungs: mostly  clear Abdomen: soft, non tender Extremities: pitting edema-  Wound vac in place Dialysis Access: left upper arm AVF    04/01/2021,11:17 AM  LOS: 2 days

## 2021-04-01 NOTE — Consult Note (Signed)
[place] Nurse Consult Note:  Patient receiving care in [name] [phone] Admitted with home wound vac to upper right and left thighs. Reason for Consult: Wound Vac Wound type: Calciphylaxis of bilateral upper thighs. Medial and lateral wounds of the left upper thigh and Medial wound of the right. Pressure Injury POA: NA Measurement: See flow sheet Wound bed: all wounds are pink, moist, granulation tissue with some friability Drainage (amount, consistency, odor) bloody Periwound: Intact Dressing procedure/placement/frequency: 7 pieces black foam removed. 4 piece of black foam placed in the left two wounds with one piece of foam to bridge the wounds together. 3 pieces of black foam placed in the right wound. Drape applied, immediate suction obtained at 125 mmHg. Y connector used to connect all wounds together.  Patient also has an unstageable wound to the right heel. Two partial thickness wounds to the posterior RLE. Full thickness wound on the posterior LLE. These wounds were painted with betadine, covered with nonadherent gauze and wrapped with Kerlex. The right heel was covered with a foam dressing.   Patient states she is to go home tomorrow. 2 home wound vacs were placed in a patient belonging bag and put in the bathroom on the wheelchair in front of her red duffle bag. These vacs are to be reconnected and patient removed from hospital vac before she is discharged.    Monitor the wound area(s) for worsening of condition such as: Signs/symptoms of infection, increase in size, development of or worsening of odor, development of pain, or increased pain at the affected locations.   Notify the medical team if any of these develop.  Thank you for the consult. [place] nurse will not follow at this time.   Please re-consult the [place] team if needed.  Cathlean Marseilles Tamala Julian, MSN, RN, [place], Lysle Pearl, [name] Wound Treatment Associate Pager [phone]

## 2021-04-01 NOTE — Progress Notes (Incomplete)
  Echocardiogram 2D Echocardiogram has been performed.  Amy Moses 04/01/2021, 1:44 PM

## 2021-04-01 NOTE — Progress Notes (Signed)
Patient reported that she does not have a safe way to get inside her home until her daughter gets off work tomorrow morning resulting in a barrier in her discharge, patient also reports she already discussed the barriers with MD, RN followed up with MD about patient's concerns and will continue to monitor this patient.

## 2021-04-01 NOTE — Discharge Summary (Signed)
Physician Discharge Summary  Amy Moses [id number] DOB: [birth year] DOA: 03/30/2021  PCP: Leeanne Rio, MD  Admit date: 03/30/2021 Discharge date: 04/01/2021  Admitted From: home  Disposition:  home   Recommendations for Outpatient Follow-up:  1.  dry weight which is being lowered- nephro to follow  Home Health:  none  Discharge Condition:  stable   CODE STATUS:  Full code   Diet recommendation:  Renal diet, carb mod, heart healthy Consultations:  Nephrology   Procedures/Studies: . none   Discharge Diagnoses:  Principal Problem:   Fever Active Problems:   Acute on chronic heart failure with preserved ejection fraction (HFpEF) ([place])   Uncontrolled type 2 diabetes mellitus with chronic kidney disease, with long-term current use of insulin ([place])   Hyperlipidemia   Essential hypertension, benign   End stage renal disease ([place])   Pressure injury of skin     Brief Summary: Amy Moses is a 50 y.o.femalewith a history ofESRD, T2DM, HLD, hypothyroidism, calciphylaxis with wound vac who presented to the ED 5/10 at 5am with a couple days of not feeling right, wooziness in the head, cough, fever to 102F.  She states that she always has sinus drainage and this is no different than before.  She does have a new cough which is productive of clear sputum.  She was given antibiotics initially in the ED but further antibiotics were held.  Hospital Course:  Principal Problem:   Fever- unknown origin-  -As mentioned, she has symptoms of chronic sinus drainage but a new mild cough- chest x-ray is clear- I have asked her to avoid taking Tylenol and ibuprofen for her pain to allow [name] to see if she continues to have temperatures - fevers have resolved without any antipyretics  - Of note, she began to have diarrhea last night-this could be from the antibiotics she received in the ED on 5/10 -She states today that diarrhea has nearly resolved - blood cultures negative - WBC  down to 8.8 from 14.4 -Wound vacs form b/l thighs and right heel have been removed by RN- no infection noted  Active Problems:   Hypervolemia in ESRD- Acute on chronic HFpEF & Mildly elevated Troponin - dialysis days> Tu/Thus/Sat dialysis - troponin elevation likely due to fluid overload in setting of renal failure - She has been dialyzed yesterday and again today -per, notes, Nephrology plans to lower EDW    Uncontrolled type 2 diabetes mellitus with chronic kidney disease, with long-term current use of insulin   -Continue home Lantus and Novolog    Hyperlipidemia - cont Lipitor    Essential hypertension, benign - cont Toprol, Demadex       Pressure injury of skin - right heel and b/l buttocks- continue wound care  Calciphylaxis of b/l thighs - has wound vacs- wound are not infected    Discharge Exam: Vitals:   04/01/21 1530 04/01/21 1600  BP: 132/68 124/66  Pulse:    Resp: 12 (!) 23  Temp:    SpO2:     Vitals:   04/01/21 1406 04/01/21 1500 04/01/21 1530 04/01/21 1600  BP: (!) 151/77 123/67 132/68 124/66  Pulse:      Resp: '17 19 12 '$ (!) 23  Temp:      TempSrc:      SpO2:      Weight:        General: Pt is alert, awake, not in acute distress Cardiovascular: RRR, S1/S2 +, no rubs, no gallops Respiratory: CTA bilaterally, no wheezing, no rhonchi  Abdominal: Soft, NT, ND, bowel sounds + Extremities: no edema, no cyanosis   Discharge Instructions  Discharge Instructions    Diet - low sodium heart healthy   Complete by: As directed    Increase activity slowly   Complete by: As directed    No wound care   Complete by: As directed      Allergies as of 04/01/2021      Reactions   Ace Inhibitors Cough      Medication List    TAKE these medications   acetaminophen 500 MG tablet Commonly known as: TYLENOL Take 1,000 mg by mouth every 6 (six) hours as needed for moderate pain.   aspirin 325 MG EC tablet Take 325 mg by mouth daily.   atorvastatin 10  MG tablet Commonly known as: LIPITOR Take 10 mg by mouth daily.   epoetin alfa 4000 UNIT/ML injection Commonly known as: EPOGEN Inject 4,000 Units into the skin every 14 (fourteen) days.   HumaLOG KwikPen 100 UNIT/ML KwikPen Generic drug: insulin lispro Inject 5-15 Units into the skin 3 (three) times daily with meals.   insulin glargine 100 UNIT/ML injection Commonly known as: LANTUS Inject 50 Units into the skin at bedtime.   levothyroxine 50 MCG tablet Commonly known as: SYNTHROID Take 50 mcg by mouth daily before breakfast.   metoprolol succinate 50 MG 24 hr tablet Commonly known as: TOPROL-XL Take 50 mg by mouth at bedtime. Take with or immediately following a meal.   sevelamer carbonate 800 MG tablet Commonly known as: RENVELA Take 800 mg by mouth 3 (three) times daily with meals.   sodium bicarbonate 650 MG tablet Take 2 tablets (1,300 mg total) by mouth 3 (three) times daily.   torsemide 100 MG tablet Commonly known as: DEMADEX Take 150 mg by mouth daily.       Allergies  Allergen Reactions  . Ace Inhibitors Cough      ECHOCARDIOGRAM COMPLETE  Result Date: 04/01/2021    ECHOCARDIOGRAM REPORT   Patient Name:   Amy Moses Date of Exam: 04/01/2021 Medical Rec #:  [id number]       Height:       66.0 in Accession #:    [id number]      Weight:       182.5 lb Date of Birth:  Feb 25, 1971      BSA:          1.924 m Patient Age:    75 years        BP:           125/70 mmHg Patient Gender: F               HR:           67 bpm. Exam Location:  Inpatient Procedure: 2D Echo, Cardiac Doppler and Color Doppler Indications:    CHF  History:        Patient has prior history of Echocardiogram examinations, most                 recent 06/10/2020. Risk Factors:Hypertension and Diabetes.  Sonographer:    Cammy Brochure Referring Phys: [place]  1. Left ventricular ejection fraction, by estimation, is 55 to 60%. The left ventricle has normal function. The left  ventricle has no regional wall motion abnormalities. Left ventricular diastolic parameters are consistent with Grade II diastolic dysfunction (pseudonormalization).  2. Right ventricular systolic function is normal. The right ventricular size is normal.  3. Left  atrial size was mildly dilated.  4. A small pericardial effusion is present.  5. The mitral valve is normal in structure. No evidence of mitral valve regurgitation.  6. Tricuspid valve regurgitation is mild to moderate.  7. The aortic valve is normal in structure. Aortic valve regurgitation is not visualized. FINDINGS  Left Ventricle: Left ventricular ejection fraction, by estimation, is 55 to 60%. The left ventricle has normal function. The left ventricle has no regional wall motion abnormalities. The left ventricular internal cavity size was normal in size. There is  no left ventricular hypertrophy. Left ventricular diastolic parameters are consistent with Grade II diastolic dysfunction (pseudonormalization). Right Ventricle: The right ventricular size is normal. No increase in right ventricular wall thickness. Right ventricular systolic function is normal. Left Atrium: Left atrial size was mildly dilated. Right Atrium: Right atrial size was normal in size. Pericardium: A small pericardial effusion is present. Mitral Valve: The mitral valve is normal in structure. No evidence of mitral valve regurgitation. MV peak gradient, 8.4 mmHg. The mean mitral valve gradient is 4.0 mmHg. Tricuspid Valve: The tricuspid valve is normal in structure. Tricuspid valve regurgitation is mild to moderate. Aortic Valve: The aortic valve is normal in structure. Aortic valve regurgitation is not visualized. Aortic valve mean gradient measures 7.0 mmHg. Aortic valve peak gradient measures 14.7 mmHg. Aortic valve area, by VTI measures 1.30 cm. Pulmonic Valve: The pulmonic valve was normal in structure. Pulmonic valve regurgitation is trivial. Aorta: The aortic root and ascending  aorta are structurally normal, with no evidence of dilitation. IAS/Shunts: The atrial septum is grossly normal.  LEFT VENTRICLE PLAX 2D LVIDd:         4.90 cm  Diastology LVIDs:         3.40 cm  LV e' medial:    5.44 cm/s LV PW:         1.40 cm  LV E/e' medial:  24.1 LV IVS:        1.00 cm  LV e' lateral:   8.05 cm/s LVOT diam:     1.80 cm  LV E/e' lateral: 16.3 LV SV:         54 LV SV Index:   28 LVOT Area:     2.54 cm  RIGHT VENTRICLE             IVC RV Basal diam:  3.80 cm     IVC diam: 1.20 cm RV S prime:     13.30 cm/s TAPSE (M-mode): 2.4 cm LEFT ATRIUM             Index       RIGHT ATRIUM           Index LA diam:        4.50 cm 2.34 cm/m  RA Area:     16.10 cm LA Vol (A2C):   67.2 ml 34.93 ml/m RA Volume:   41.70 ml  21.68 ml/m LA Vol (A4C):   65.9 ml 34.26 ml/m LA Biplane Vol: 72.4 ml 37.64 ml/m  AORTIC VALVE AV Area (Vmax):    1.22 cm AV Area (Vmean):   1.19 cm AV Area (VTI):     1.30 cm AV Vmax:           192.00 cm/s AV Vmean:          127.000 cm/s AV VTI:            0.419 m AV Peak Grad:      14.7 mmHg AV Mean  Grad:      7.0 mmHg LVOT Vmax:         91.70 cm/s LVOT Vmean:        59.500 cm/s LVOT VTI:          0.214 m LVOT/AV VTI ratio: 0.51  AORTA Ao Root diam: 2.50 cm MITRAL VALVE                TRICUSPID VALVE MV Area (PHT): 4.60 cm     TR Peak grad:   52.4 mmHg MV Area VTI:   1.53 cm     TR Vmax:        362.00 cm/s MV Peak grad:  8.4 mmHg MV Mean grad:  4.0 mmHg     SHUNTS MV Vmax:       1.45 m/s     Systemic VTI:  0.21 m MV Vmean:      89.5 cm/s    Systemic Diam: 1.80 cm MV Decel Time: 165 msec MV E velocity: 131.00 cm/s MV A velocity: 102.00 cm/s MV E/A ratio:  1.28 Amy Moores MD Electronically signed by Amy Moores MD Signature Date/Time: 04/01/2021/3:33:24 PM    Final    VAS [name] UPPER EXTREMITY VENOUS DUPLEX  Result Date: 03/20/2021 UPPER VENOUS STUDY  Patient Name:  NEHARIKA BARBOZA  Date of Exam:   03/18/2021 Medical Rec #: [id number]        Accession #:    [id number] Date of Birth:  1971/05/02       Patient Gender: F Patient Age:   15Y Exam Location[facility]  [facility] Procedure:      VAS [name] UPPER EXTREMITY VENOUS DUPLEX Referring Phys: [id number] HISTORICAL PROVIDER --------------------------------------------------------------------------------  Indications: Edema, and Pain Limitations: Poor ultrasound/tissue interface. Comparison Study: No prior study Performing Technologist: Maudry Mayhew MHA, RDMS, RVT, RDCS  Examination Guidelines: A complete evaluation includes B-mode imaging, spectral Doppler, color Doppler, and power Doppler as needed of all accessible portions of each vessel. Bilateral testing is considered an integral part of a complete examination. Limited examinations for reoccurring indications may be performed as noted.  Right Findings: +----------+------------+---------+-----------+----------+-------+ RIGHT     CompressiblePhasicitySpontaneousPropertiesSummary +----------+------------+---------+-----------+----------+-------+ IJV           Full       Yes       Yes                      +----------+------------+---------+-----------+----------+-------+ Subclavian    Full       Yes       Yes                      +----------+------------+---------+-----------+----------+-------+ Axillary      Full       Yes       Yes                      +----------+------------+---------+-----------+----------+-------+ Brachial      Full       Yes       Yes                      +----------+------------+---------+-----------+----------+-------+ Radial        Full                                          +----------+------------+---------+-----------+----------+-------+ Ulnar  Full                                          +----------+------------+---------+-----------+----------+-------+ Cephalic      Full                                          +----------+------------+---------+-----------+----------+-------+ Basilic       Full                                           +----------+------------+---------+-----------+----------+-------+  Left Findings: +----------+------------+---------+-----------+----------+-------+ LEFT      CompressiblePhasicitySpontaneousPropertiesSummary +----------+------------+---------+-----------+----------+-------+ Subclavian               Yes       Yes                      +----------+------------+---------+-----------+----------+-------+  Summary:  Right: No evidence of deep vein thrombosis in the upper extremity. No evidence of superficial vein thrombosis in the upper extremity.  Left: No evidence of thrombosis in the subclavian.  *See table(s) above for measurements and observations.  Diagnosing physician: Harold Barban MD Electronically signed by Harold Barban MD on 03/20/2021 at 4:17:34 PM.    Final      The results of significant diagnostics from this hospitalization (including imaging, microbiology, ancillary and laboratory) are listed below for reference.     Microbiology: Recent Results (from the past 240 hour(s))  Culture, blood (routine x 2)     Status: None (Preliminary result)   Collection Time: 03/30/21  4:58 PM   Specimen: BLOOD RIGHT FOREARM  Result Value Ref Range Status   Specimen Description BLOOD RIGHT FOREARM  Final   Special Requests   Final    BOTTLES DRAWN AEROBIC AND ANAEROBIC Blood Culture adequate volume   Culture   Final    NO GROWTH 2 DAYS Performed at [facility], [address], [place], [place] [address]    Report Status PENDING  Incomplete  Culture, blood (routine x 2)     Status: None (Preliminary result)   Collection Time: 03/30/21  5:07 PM   Specimen: BLOOD RIGHT HAND  Result Value Ref Range Status   Specimen Description BLOOD RIGHT HAND  Final   Special Requests   Final    BOTTLES DRAWN AEROBIC AND ANAEROBIC Blood Culture adequate volume   Culture   Final    NO GROWTH 2 DAYS Performed at [facility], [place] [address] [place], [place] [address]    Report Status PENDING  Incomplete  MRSA PCR Screening     Status: None   Collection Time: 03/30/21  5:25 PM   Specimen: Nasal Mucosa; Nasopharyngeal  Result Value Ref Range Status   MRSA by PCR NEGATIVE NEGATIVE Final    Comment:        The GeneXpert MRSA Assay (FDA approved for NASAL specimens only), is one component of a comprehensive MRSA colonization surveillance program. It is not intended to diagnose MRSA infection nor to guide or monitor treatment for MRSA infections. Performed at [facility], [place] [address], [place], [place] [address]      Labs: BNP (last 3 results)  Recent Labs    03/30/21 1658  BNP 123456*   Basic Metabolic Panel: Recent Labs  Lab 03/30/21 1658 03/31/21 0355  NA 132* 133*  K 3.8 3.9  CL 89* 96*  CO2 21* 18*  GLUCOSE 293* 103*  BUN 54* 58*  CREATININE 6.27* 6.48*  CALCIUM 9.1 9.4   Liver Function Tests: Recent Labs  Lab 03/30/21 1658  AST 29  ALT 23  ALKPHOS 178*  BILITOT 1.3*  PROT 5.7*  ALBUMIN 2.4*   No results for input(s): LIPASE, AMYLASE in the last 168 hours. No results for input(s): AMMONIA in the last 168 hours. CBC: Recent Labs  Lab 03/30/21 1658 03/31/21 0355 04/01/21 0820  WBC 14.4* 12.5* 8.8  HGB 9.4* 9.4* 10.2*  HCT 30.3* 30.3* 32.8*  MCV 84.9 85.6 85.4  PLT 267 251 248   Cardiac Enzymes: No results for input(s): CKTOTAL, CKMB, CKMBINDEX, TROPONINI in the last 168 hours. BNP: Invalid input(s): POCBNP CBG: Recent Labs  Lab 03/31/21 1723 03/31/21 2207 04/01/21 0249 04/01/21 0605 04/01/21 1217  GLUCAP 150* 163* 91 130* 99   D-Dimer No results for input(s): DDIMER in the last 72 hours. Hgb A1c Recent Labs    03/30/21 1841  HGBA1C 7.3*   Lipid Profile No results for input(s): CHOL, HDL, LDLCALC, TRIG, CHOLHDL, LDLDIRECT in the last 72 hours. Thyroid function studies No results for input(s): TSH, T4TOTAL, T3FREE, THYROIDAB in the last 72 hours.  Invalid  input(s): FREET3 Anemia work up No results for input(s): VITAMINB12, FOLATE, FERRITIN, TIBC, IRON, RETICCTPCT in the last 72 hours. Urinalysis No results found for: COLORURINE, APPEARANCEUR, [place], [place], [place], [place], [place], [place], PROTEINUR, UROBILINOGEN, NITRITE, LEUKOCYTESUR Sepsis Labs Invalid input(s): PROCALCITONIN,  WBC,  LACTICIDVEN Microbiology Recent Results (from the past 240 hour(s))  Culture, blood (routine x 2)     Status: None (Preliminary result)   Collection Time: 03/30/21  4:58 PM   Specimen: BLOOD RIGHT FOREARM  Result Value Ref Range Status   Specimen Description BLOOD RIGHT FOREARM  Final   Special Requests   Final    BOTTLES DRAWN AEROBIC AND ANAEROBIC Blood Culture adequate volume   Culture   Final    NO GROWTH 2 DAYS Performed at [facility], [address] [place], [place] [address]    Report Status PENDING  Incomplete  Culture, blood (routine x 2)     Status: None (Preliminary result)   Collection Time: 03/30/21  5:07 PM   Specimen: BLOOD RIGHT HAND  Result Value Ref Range Status   Specimen Description BLOOD RIGHT HAND  Final   Special Requests   Final    BOTTLES DRAWN AEROBIC AND ANAEROBIC Blood Culture adequate volume   Culture   Final    NO GROWTH 2 DAYS Performed at [facility], [place] [address], [place], [place] [address]    Report Status PENDING  Incomplete  MRSA PCR Screening     Status: None   Collection Time: 03/30/21  5:25 PM   Specimen: Nasal Mucosa; Nasopharyngeal  Result Value Ref Range Status   MRSA by PCR NEGATIVE NEGATIVE Final    Comment:        The GeneXpert MRSA Assay (FDA approved for NASAL specimens only), is one component of a comprehensive MRSA colonization surveillance program. It is not intended to diagnose MRSA infection nor to guide or monitor treatment for MRSA infections. Performed at [facility], [place] [address] [place], [place] [address]      Time  coordinating discharge  in minutes: 65  SIGNED:   Debbe Odea, MD  [facility] 04/01/2021, 4:21 PM

## 2021-04-02 ENCOUNTER — Ambulatory Visit: Payer: Medicaid - Out of State | Admitting: Physician Assistant

## 2021-04-02 LAB — GLUCOSE, CAPILLARY
Glucose-Capillary: 92 mg/dL (ref 70–99)
Glucose-Capillary: 76 mg/dL (ref 70–99)
Glucose-Capillary: 100 mg/dL — ABNORMAL HIGH (ref 70–99)

## 2021-04-02 LAB — HEPATITIS B SURFACE ANTIBODY, QUANTITATIVE: Hep B S AB Quant (Post): <3.1 m[IU]/mL — ABNORMAL LOW (ref >9.9)

## 2021-04-02 NOTE — Care Management (Addendum)
Patient was discharged earlier today.   Prior to admission she had VAC's with [facility]. Patient was discharged with no resumption of care orders. NCM spoke to [facility] with [facility] phone [phone]. Patient was discharged greater than 2 hours ago. NCM unable to enter resumption of care orders.  [facility]an requesting MD progress note to stae continue current wound care VAC's Monday Wednesday and Friday. NCM entered note and secure chatted MD to sign, once signed NCM will fax to [facility] at [phone].   [facility]an unsure if they have the staffing to accept patient back, she will check with her director   Magdalen Spatz RN   Agree with above.   Debbe Odea, MD

## 2021-04-02 NOTE — Care Management (Signed)
[facility] at [facility] has accepted patient back. Start of care will be Monday Apr 05, 2021

## 2021-04-02 NOTE — Discharge Planning (Signed)
Patient in her own WC. Discharge instructions given to patient and daughter. Patient with no complaints. Current CBG 100. Iv d/c'd intact. Will take to daughters vehicle.

## 2021-04-02 NOTE — Care Management (Cosign Needed)
Continue current wound care treatment , [facility] Monday , Wednesday Friday    Magdalen Spatz RN

## 2021-04-04 LAB — CULTURE, BLOOD (ROUTINE X 2)
Culture: NO GROWTH
Culture: NO GROWTH
Special Requests: ADEQUATE
Special Requests: ADEQUATE

## 2021-04-05 ENCOUNTER — Other Ambulatory Visit: Payer: Self-pay

## 2021-04-05 ENCOUNTER — Encounter: Payer: Medicaid - Out of State | Attending: Physician Assistant | Admitting: Physician Assistant

## 2021-04-05 DIAGNOSIS — L89313 Pressure ulcer of right buttock, stage 3: Secondary | ICD-10-CM | POA: Diagnosis not present

## 2021-04-05 DIAGNOSIS — Z794 Long term (current) use of insulin: Secondary | ICD-10-CM | POA: Diagnosis not present

## 2021-04-05 DIAGNOSIS — E11621 Type 2 diabetes mellitus with foot ulcer: Secondary | ICD-10-CM | POA: Insufficient documentation

## 2021-04-05 DIAGNOSIS — I120 Hypertensive chronic kidney disease with stage 5 chronic kidney disease or end stage renal disease: Secondary | ICD-10-CM | POA: Diagnosis not present

## 2021-04-05 DIAGNOSIS — E1122 Type 2 diabetes mellitus with diabetic chronic kidney disease: Secondary | ICD-10-CM | POA: Diagnosis not present

## 2021-04-05 DIAGNOSIS — Z992 Dependence on renal dialysis: Secondary | ICD-10-CM | POA: Diagnosis not present

## 2021-04-05 DIAGNOSIS — N186 End stage renal disease: Secondary | ICD-10-CM | POA: Insufficient documentation

## 2021-04-05 DIAGNOSIS — E1142 Type 2 diabetes mellitus with diabetic polyneuropathy: Secondary | ICD-10-CM | POA: Insufficient documentation

## 2021-04-05 DIAGNOSIS — L89610 Pressure ulcer of right heel, unstageable: Secondary | ICD-10-CM | POA: Diagnosis not present

## 2021-04-05 DIAGNOSIS — L89323 Pressure ulcer of left buttock, stage 3: Secondary | ICD-10-CM | POA: Insufficient documentation

## 2021-04-05 NOTE — Progress Notes (Addendum)
Amy Moses, Amy Moses ([id number]) Visit Report for 04/05/2021 Chief Complaint Document Details Patient Name: Amy Moses, Amy Moses Date of Service: 04/05/2021 10:45 AM Medical Record Number: [id number] Patient Account Number: [id number] Date of Birth/Sex: [birth year] (50 y.o. F) Treating RN: Donnamarie Poag Primary Care Provider: Zenon Mayo Other Clinician: Referring Provider: Zenon Mayo Treating Provider/Extender: Skipper Cliche in Treatment: 17 Information Obtained from: Patient Chief Complaint 12/02/2020; patient is here for review of extensive wounds on her bilateral thighs as well as an area on her right plantar heel Electronic Signature(s) Signed: 04/05/2021 11:20:18 AM By: Worthy Keeler PA-C Entered By: Worthy Keeler on 04/05/2021 11:20:17 Amy Moses ([id number]) -------------------------------------------------------------------------------- Debridement Details Patient Name: Amy Moses Date of Service: 04/05/2021 10:45 AM Medical Record Number: [id number] Patient Account Number: [id number] Date of Birth/Sex: 1971-06-30 (50 y.o. F) Treating RN: Donnamarie Poag Primary Care Provider: Zenon Mayo Other Clinician: Referring Provider: Zenon Mayo Treating Provider/Extender: Skipper Cliche in Treatment: 17 Debridement Performed for Wound #3 Left,Lateral Upper Leg Assessment: Performed By: Physician Tommie Sams., PA-C Debridement Type: Debridement Level of Consciousness (Pre- Awake and Alert procedure): Pre-procedure Verification/Time Out Yes - 11:56 Taken: Start Time: 11:56 Total Area Debrided (L x W): 2 (cm) x 2 (cm) = 4 (cm) Tissue and other material Slough, Subcutaneous, Slough debrided: Level: Skin/Subcutaneous Tissue Debridement Description: Excisional Instrument: Forceps, Other : tweezer Bleeding: Minimum Hemostasis Achieved: Pressure End Time: 11:58 Response to Treatment: Procedure was tolerated well Level of Consciousness (Post- Awake and  Alert procedure): Post Debridement Measurements of Total Wound Length: (cm) 2 Width: (cm) 2 Depth: (cm) 0.8 Volume: (cm) 2.513 Character of Wound/Ulcer Post Debridement: Improved Post Procedure Diagnosis Same as Pre-procedure Electronic Signature(s) Signed: 04/05/2021 12:46:11 PM By: Donnamarie Poag Signed: 04/05/2021 5:15:41 PM By: Worthy Keeler PA-C Entered By: Donnamarie Poag on 04/05/2021 12:46:10 Amy Moses ([id number]) -------------------------------------------------------------------------------- Debridement Details Patient Name: Amy Moses Date of Service: 04/05/2021 10:45 AM Medical Record Number: [id number] Patient Account Number: [id number] Date of Birth/Sex: [birth year] (50 y.o. F) Treating RN: Donnamarie Poag Primary Care Provider: Zenon Mayo Other Clinician: Referring Provider: Zenon Mayo Treating Provider/Extender: Skipper Cliche in Treatment: 17 Debridement Performed for Wound #11 Right,Distal,Posterior Lower Leg Assessment: Performed By: Physician Tommie Sams., PA-C Debridement Type: Debridement Level of Consciousness (Pre- Awake and Alert procedure): Pre-procedure Verification/Time Out Yes - 11:59 Taken: Start Time: 11:59 Total Area Debrided (L x W): 2.5 (cm) x 2 (cm) = 5 (cm) Tissue and other material Eschar, Subcutaneous debrided: Level: Skin/Subcutaneous Tissue Debridement Description: Excisional Instrument: Forceps, Other : tweezers Bleeding: Moderate Hemostasis Achieved: Pressure End Time: 12:00 Response to Treatment: Procedure was tolerated well Level of Consciousness (Post- Awake and Alert procedure): Post Debridement Measurements of Total Wound Length: (cm) 2.5 Width: (cm) 2 Depth: (cm) 0.2 Volume: (cm) 0.785 Character of Wound/Ulcer Post Debridement: Improved Post Procedure Diagnosis Same as Pre-procedure Electronic Signature(s) Signed: 04/05/2021 12:49:02 PM By: Donnamarie Poag Signed: 04/05/2021 5:15:41 PM By: Worthy Keeler PA-C Entered By: Donnamarie Poag on 04/05/2021 12:49:02 Amy Moses ([id number]) -------------------------------------------------------------------------------- Debridement Details Patient Name: Amy Moses Date of Service: 04/05/2021 10:45 AM Medical Record Number: [id number] Patient Account Number: [id number] Date of Birth/Sex: [birth year] (50 y.o. F) Treating RN: Donnamarie Poag Primary Care Provider: Zenon Mayo Other Clinician: Referring Provider: Zenon Mayo Treating Provider/Extender: Skipper Cliche in Treatment: 17 Debridement Performed for Wound #4 Right Calcaneus Assessment: Performed By: Physician Tommie Sams., PA-C Debridement Type: Debridement Level of Consciousness (Pre- Awake and Alert procedure): Pre-procedure Verification/Time Out Yes - 12:00 Taken: Start  Time: 12:00 Total Area Debrided (L x W): 5 (cm) x 6.5 (cm) = 32.5 (cm) Tissue and other material Eschar, Subcutaneous debrided: Level: Skin/Subcutaneous Tissue Debridement Description: Excisional Instrument: Forceps, Other : tweezers Bleeding: Moderate Hemostasis Achieved: Pressure End Time: 12:04 Response to Treatment: Procedure was tolerated well Level of Consciousness (Post- Awake and Alert procedure): Post Debridement Measurements of Total Wound Length: (cm) 5 Stage: Unstageable/Unclassified Width: (cm) 6.5 Depth: (cm) 0.3 Volume: (cm) 7.658 Character of Wound/Ulcer Post Debridement: Improved Post Procedure Diagnosis Same as Pre-procedure Electronic Signature(s) Signed: 04/05/2021 12:50:27 PM By: Donnamarie Poag Signed: 04/05/2021 5:15:41 PM By: Worthy Keeler PA-C Entered By: Donnamarie Poag on 04/05/2021 12:50:27 Amy Moses ([id number]) -------------------------------------------------------------------------------- HPI Details Patient Name: Amy Moses Date of Service: 04/05/2021 10:45 AM Medical Record Number: [id number] Patient Account Number: [id number] Date  of Birth/Sex: 03/20/1971 (50 y.o. F) Treating RN: Donnamarie Poag Primary Care Provider: Zenon Mayo Other Clinician: Referring Provider: Zenon Mayo Treating Provider/Extender: Skipper Cliche in Treatment: 17 History of Present Illness HPI Description: ADMISSION 12/02/2020 This is a 50 year old woman who is a type II diabetic. Although there are mentions of type 1 diabetes in epic clearly this woman is type II based on the fact that she was on oral agents for 10 years before starting insulin. She also is in chronic renal failure and recently started on dialysis I think in November. She relates her problems starting in September she started to develop skin excoriation on her bilateral inner thighs. She thought this was a friction phenomenon however the tissue wrist can progressively broke down. She was seen by her primary doctor on 10/21/2020 noted to have erythema on both thighs medially and a blister. MRIs were ordered and she was put on Silvadene and gauze. She was seen in the ER on 12/12 at the [place] clinic also noted to have a right unstageable heel wound. 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 She dialyzes at [place] in [facility] and she was started on sodium thiosulfate as far as the notes state. The patient states she gets something at dialysis every day although she has not exactly sure what they are giving her. She was noted by vein and vascular in [place] to have multiple open wounds on 11/19/2020 using Xeroform gauze. She had an angiogram booked by Dr. Oneida Alar predominantly I think because of the right heel ulcer although this was canceled by the patient because of the weather. The patient has large necrotic wounds on both inner thighs with covering black eschar. There is also an area on the left lateral thigh and an eschared area on her right plantar heel. She has been using Betadine to the right heel Xeroform to  the areas on her legs. The patient has Medicaid but is able to get the Xeroform, were not really sure how she is managing this although she lives in [place] and there may be different rules for Medicaid in [place] versus [place]. We certainly would not be able to get that here. 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 I wondered whether her neuropathy is particularly affected her sensation of pain since her recent left patella fracture does not seem to have been that painful either Past medical history includes stage V chronic renal failure starting on dialysis I think in November, clearly type 2 diabetes with neuropathy, hypertension, glaucoma, vitamin D deficiency, history of cholecystectomy,  edema of both legs, hypothyroidism, she dialyzes Tuesday Thursday and Saturday at [facility] in [place] The patient had arterial studies on 11/19/2020 I think at vein and vascular in [place]. She had biphasic waveforms throughout the thigh on the right and at the popliteal proximally and distally in the ATA but the PTA and peroneal were not visualized. On the left again biphasic waveforms up to the level of the distal popliteal but not visualized in the tibial vessels. She was felt to have adequate flow where visualized. As noted she was supposed to have an angiogram which I think is certainly indicated [id number] upon evaluation today patient actually appears to be doing okay in regard to her wounds. This is actually the first time of seeing her she saw Dr. Dellia Nims at the last visit she does have quite an extensive history based on review. With that being said I do not see any signs right now of active infection which is great news. Overall I am extremely pleased with where things stand in that regard. With that being said I do not believe the Xeroform is doing much for the calciphylaxis areas on her thighs and  lateral or medial locations. I really feel like she may do better with Dakin's moistened gauze. I do think that we can see about ordering the Dakin's for her she is already using gauze and then wrapping with roll gauze anyway so really would not change much except for moistening some gauze and applying it to the wound bed. With that being said I do not see any signs of active infection at this time which is great news. The patient all in all seems to be in fairly good spirits all things considered. 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 Fortunately it looks like some of the eschar is loosening up so we should be able to get this removed which hopefully will help in speeding up the healing process. With that being said with regard to the patient's gluteal region she unfortunately has new pressure ulcerations open today which I think could benefit from possibly having a air mattress. With that being said I explained to the patient that unfortunately this could still take some time as far as getting it to heal completely. There does not appear to be any signs of active infection at this time which is great news. No fevers, chills, nausea, vomiting, or diarrhea. 02/16/2021 upon evaluation today patient appears to be doing about the same in regard to her heel. Her gluteal area is actually getting better. She did have surgery in regard to her thighs bilaterally as well as her knee. It was not until she got into the operating room that it was noted that she had the wounds on the thighs. Subsequently according to the surgeons note dated 02/01/2021 they contacted the patient's daughter in order to discuss the fact they felt she needed to have the wound surgically debrided and a wound VAC placed and subsequently instead of patellar reconstruction they opted to proceed with removal  of the Portion of the patella in order to get this area to heal more effectively and quickly. Overall that seems to have done quite well. 02/26/2021 upon evaluation today patient's wounds actually appear to be doing excellent pretty much across the board I am very pleased. With that being said the wounds where she has the wound vacs placed on the thighs  in particular seems to be doing a very good job as far as healing is concerned. There does not appear to be any evidence of infection which is great news and overall I am extremely pleased in that regard. No fevers, chills, nausea, vomiting, or diarrhea. 03/12/21 upon evaluation today patient appears to be doing well with regard to her wounds. I think she is doing excellent with the wound VAC and very pleased in that regard. Fortunately there does not appear to be any signs of infection I am that a try to do a little bit of light debridement today due to some necrotic regions noted in her wounds to try to help speed up the healing process here. 04/05/2021 upon evaluation today patient appears to be doing decently well in general in regard to her wounds. She was unfortunately in the hospital from the 10th through the 12th of this month due to having high fever and subsequently they did not actually determine exactly what was going on they thought she might of just been sick or had some kind of a cold. With that being said fortunately there does not appear to be any evidence of active infection at this time which is great news. No fevers, chills, nausea, vomiting, or diarrhea. Amy Moses, Amy Moses ([id number]) Electronic Signature(s) Signed: 04/05/2021 3:26:01 PM By: Worthy Keeler PA-C Entered By: Worthy Keeler on 04/05/2021 15:26:01 Amy Moses ([id number]) -------------------------------------------------------------------------------- Physical Exam Details Patient Name: Amy Moses Date of Service: 04/05/2021 10:45 AM Medical Record Number:  [id number] Patient Account Number: [id number] Date of Birth/Sex: 09/25/1971 (49 y.o. F) Treating RN: Donnamarie Poag Primary Care Provider: Zenon Mayo Other Clinician: Referring Provider: Zenon Mayo Treating Provider/Extender: Skipper Cliche in Treatment: 3 Constitutional Well-nourished and well-hydrated in no acute distress. Respiratory normal breathing without difficulty. Psychiatric this patient is able to make decisions and demonstrates good insight into disease process. Alert and Oriented x 3. pleasant and cooperative. Notes Upon inspection patient's wound bed actually showed signs of good granulation epithelization at this point. There does not appear to be any signs of infection and in fact all of her wounds are doing quite well she does have some areas and required debridement in regard to the right lateral leg as well as the right heel. Both of these I did perform sharp debridement of a clear away some of the necrotic debris. Also did clear away the left upper leg where there were several patches of necrotic tissue to help the wound VAC more effectively work. Electronic Signature(s) Signed: 04/05/2021 3:26:41 PM By: Worthy Keeler PA-C Entered By: Worthy Keeler on 04/05/2021 15:26:40 Amy Moses ([id number]) -------------------------------------------------------------------------------- Physician Orders Details Patient Name: Amy Moses Date of Service: 04/05/2021 10:45 AM Medical Record Number: [id number] Patient Account Number: [id number] Date of Birth/Sex: [birth year] (49 y.o. F) Treating RN: Carlene Coria Primary Care Provider: Zenon Mayo Other Clinician: Referring Provider: Zenon Mayo Treating Provider/Extender: Skipper Cliche in Treatment: 17 Verbal / Phone Orders: No Diagnosis Coding ICD-10 Coding Code Description E83.59 Other disorders of calcium metabolism L97.118 Non-pressure chronic ulcer of right thigh with other specified  severity L97.128 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 L89.610 Pressure ulcer of right heel, unstageable E11.42 Type 2 diabetes mellitus with diabetic polyneuropathy L89.313 Pressure ulcer of right buttock, stage 3 L89.323 Pressure ulcer of left buttock, stage 3 Follow-up Appointments o Return Appointment in 2 weeks. [place]: - Interim [place] for wound care. May utilize  formulary equivalent dressing for wound treatment orders unless otherwise specified. Home Health Nurse may visit PRN to address patientos wound care needs. - 3 x a week Negative Pressure Wound Therapy Wound #1 Right,Medial Upper Leg o Wound VAC settings at 178mHg continuous pressure. Use foam to wound cavity. Please order WHITE foam to fill any tunnel/s and/or undermining when necessary. Change VAC dressing 3 X WEEK. Change canister as indicated when full. - no white foam ! o Home Health Nurse may d/c VAC for s/s of increased infection, significant wound regression, or uncontrolled drainage. [place] [phone] - MUST NOTIFY THIS CLINIC ASAP Wound #2 Left,Medial Upper Leg o Wound VAC settings at 1256mg continuous pressure. Use foam to wound cavity. Please order WHITE foam to fill any tunnel/s and/or undermining when necessary. Change VAC dressing 3 X WEEK. Change canister as indicated when full. - no white foam ! o Home Health Nurse may d/c VAC for s/s of increased infection, significant wound regression, or uncontrolled drainage. [place] [phone]- MUST NOTIFY THIS CLINIC ASAP Wound #3 Left,Lateral Upper Leg o Wound VAC settings at 12553m continuous pressure. Use foam to wound cavity. Please order WHITE foam to fill any tunnel/s and/or undermining when necessary. Change VAC dressing 3 X WEEK. Change canister as indicated when full. - no white foam ! o Home Health Nurse  may d/c VAC for s/s of increased infection, significant wound regression, or uncontrolled drainage. [place] [phone] MUST NOTIFY THIS CLINIC ASAP Wound Treatment Wound #10 - Calf Wound Laterality: Right, Lateral Primary Dressing: Silvercel 4 1/4x 4 1/4 (in/in) (Home Health) 3 x Per Week/30 Days Discharge Instructions: Apply Silvercel 4 1/4x 4 1/4 (in/in) as instructed Wound #4 - Calcaneus Wound Laterality: Right Topical: Betadine (Home Health) (Generic) 3 x Per Week/30 Days Discharge Instructions: apply to eascar Primary Dressing: Silvercel Small 2x2 (in/in) (Home Health) 3 x Per Week/30 Days Discharge Instructions: open areas Secondary Dressing: Mepilex Border Flex, 4x4 (in/in) (Home Health) (Generic) 3 x Per Week/30 Days Discharge Instructions: Apply to wound as directed. Do not cut. Amy Moses, [id number] #6 - Gluteus Wound Laterality: Right Peri-Wound Care: Desitin Maximum Strength Ointment 4 (oz) (Home Health) (Generic) 3 x Per Week/30 Days Peri-Wound Care: Skin Prep (Home Health) (Generic) 3 x Per Week/30 Days Discharge Instructions: Use skin prep as directed Primary Dressing: Silvercel Small 2x2 (in/in) (Home Health) (Generic) 3 x Per Week/30 Days Discharge Instructions: Apply Silvercel Small 2x2 (in/in) as instructed Secondary Dressing: Mepilex Border Flex, 4x4 (in/in) (Home Health) (Generic) 3 x Per Week/30 Days Discharge Instructions: Apply to wound as directed. Do not cut. Wound #8 - Gluteus Wound Laterality: Left Primary Dressing: Silvercel Small 2x2 (in/in) (Home Health) (Generic) 3 x Per Week/30 Days Discharge Instructions: Apply Silvercel Small 2x2 (in/in) as instructed Secondary Dressing: Mepilex Border Flex, 4x4 (in/in) (Generic) 3 x Per Week/30 Days Discharge Instructions: Apply to wound as directed. Do not cut. Wound #9 - Calf Wound Laterality: Left, Lateral Topical: Betadine (Home Health) 3 x Per Week/30 Days Electronic  Signature(s) Signed: 04/05/2021 5:15:41 PM By: StoWorthy Keeler-C Signed: 04/05/2021 8:20:21 PM By: EppCarlene Coria Entered By: EppCarlene Coria 04/05/2021 11:50:27 Amy [id number]------------------------------------------------------------------------------- Problem List Details Patient Name: Amy Kirkte of Service: 04/05/2021 10:45 AM Medical Record Number: [id number] Account Number: [id number] of Birth/Sex: [birth year]49 y.o. F) Treating RN: BisDonnamarie Poagimary Care Provider: RucZenon Mayoher Clinician: Referring Provider: RucZenon Mayo  Treating Provider/Extender: Jeri Cos Weeks in Treatment: 17 Active Problems ICD-10 Encounter Code Description Active Date MDM Diagnosis E83.59 Other disorders of calcium metabolism 12/02/2020 No Yes L97.118 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 12/02/2020 No Yes L97.128 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 12/02/2020 No Yes L89.610 Pressure ulcer of right heel, unstageable 12/02/2020 No Yes E11.42 Type 2 diabetes mellitus with diabetic polyneuropathy 12/02/2020 No Yes L89.313 Pressure ulcer of right buttock, stage 3 01/19/2021 No Yes L89.323 Pressure ulcer of left buttock, stage 3 01/19/2021 No Yes Inactive Problems Resolved Problems Electronic Signature(s) Signed: 04/05/2021 11:20:11 AM By: Worthy Keeler PA-C Entered By: Worthy Keeler on 04/05/2021 11:20:11 Amy Moses ([id number]) -------------------------------------------------------------------------------- Progress Note Details Patient Name: Amy Moses Date of Service: 04/05/2021 10:45 AM Medical Record Number: [id number] Patient Account Number: [id number] Date of Birth/Sex: [birth year] (49 y.o. F) Treating RN: Donnamarie Poag Primary Care Provider: Zenon Mayo Other Clinician: Referring Provider: Zenon Mayo Treating Provider/Extender: Skipper Cliche in Treatment:  17 Subjective Chief Complaint Information obtained from Patient 12/02/2020; patient is here for review of extensive wounds on her bilateral thighs as well as an area on her right plantar heel History of Present Illness (HPI) ADMISSION 12/02/2020 This is a 50 year old woman who is a type II diabetic. Although there are mentions of type 1 diabetes in epic clearly this woman is type II based on the fact that she was on oral agents for 10 years before starting insulin. She also is in chronic renal failure and recently started on dialysis I think in November. She relates her problems starting in September she started to develop skin excoriation on her bilateral inner thighs. She thought this was a friction phenomenon however the tissue wrist can progressively broke down. She was seen by her primary doctor on 10/21/2020 noted to have erythema on both thighs medially and a blister. MRIs were ordered and she was put on Silvadene and gauze. She was seen in the ER on 12/12 at the [place] clinic also noted to have a right unstageable heel wound. 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 She dialyzes at [place] in [facility] and she was started on sodium thiosulfate as far as the notes state. The patient states she gets something at dialysis every day although she has not exactly sure what they are giving her. She was noted by vein and vascular in [place] to have multiple open wounds on 11/19/2020 using Xeroform gauze. She had an angiogram booked by Dr. Oneida Alar predominantly I think because of the right heel ulcer although this was canceled by the patient because of the weather. The patient has large necrotic wounds on both inner thighs with covering black eschar. There is also an area on the left lateral thigh and an eschared area on her right plantar heel. She has been using Betadine to the right heel Xeroform to the areas on her legs. The patient  has Medicaid but is able to get the Xeroform, were not really sure how she is managing this although she lives in [place] and there may be different rules for Medicaid in [place] versus [place]. We certainly would not be able to get that here. 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 I wondered whether her neuropathy is particularly affected her sensation of pain since her recent left patella fracture does not  seem to have been that painful either Past medical history includes stage V chronic renal failure starting on dialysis I think in November, clearly type 2 diabetes with neuropathy, hypertension, glaucoma, vitamin D deficiency, history of cholecystectomy, edema of both legs, hypothyroidism, she dialyzes Tuesday Thursday and Saturday at [place] in [place] The patient had arterial studies on 11/19/2020 I think at vein and vascular in [place]. She had biphasic waveforms throughout the thigh on the right and at the popliteal proximally and distally in the ATA but the PTA and peroneal were not visualized. On the left again biphasic waveforms up to the level of the distal popliteal but not visualized in the tibial vessels. She was felt to have adequate flow where visualized. As noted she was supposed to have an angiogram which I think is certainly indicated [id number] upon evaluation today patient actually appears to be doing okay in regard to her wounds. This is actually the first time of seeing her she saw Dr. Dellia Nims at the last visit she does have quite an extensive history based on review. With that being said I do not see any signs right now of active infection which is great news. Overall I am extremely pleased with where things stand in that regard. 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 I  really feel like she may do better with Dakin's moistened gauze. I do think that we can see about ordering the Dakin's for her she is already using gauze and then wrapping with roll gauze anyway so really would not change much except for moistening some gauze and applying it to the wound bed. With that being said I do not see any signs of active infection at this time which is great news. The patient all in all seems to be in fairly good spirits all things considered. 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 Fortunately it looks like some of the eschar is loosening up so we should be able to get this removed which hopefully will help in speeding up the healing process. With that being said with regard to the patient's gluteal region she unfortunately has new pressure ulcerations open today which I think could benefit from possibly having a air mattress. With that being said I explained to the patient that unfortunately this could still take some time as far as getting it to heal completely. There does not appear to be any signs of active infection at this time which is great news. No fevers, chills, nausea, vomiting, or diarrhea. 02/16/2021 upon evaluation today patient appears to be doing about the same in regard to her heel. Her gluteal area is actually getting better. She did have surgery in regard to her thighs bilaterally as well as her knee. It was not until she got into the operating room that it was noted that she had the wounds on the thighs. Subsequently according to the surgeons note dated 02/01/2021 they contacted the patient's daughter in order to discuss the fact they felt she needed to have the wound surgically debrided and a wound VAC placed and subsequently instead of patellar reconstruction they opted to proceed with removal of the Portion of the patella in  order to get this area to heal more effectively and quickly. Overall that seems to have done quite well.  02/26/2021 upon evaluation today patient's wounds actually appear to be doing excellent pretty much across the board I am very pleased. With that being said the wounds where she has the wound vacs placed on the thighs in particular seems to be doing a very good job as far as healing is concerned. There does not appear to be any evidence of infection which is great news and overall I am extremely pleased in that regard. No fevers, chills, nausea, vomiting, or diarrhea. 03/12/21 upon evaluation today patient appears to be doing well with regard to her wounds. I think she is doing excellent with the wound VAC and very pleased in that regard. Fortunately there does not appear to be any signs of infection I am that a try to do a little bit of light debridement today due to some necrotic regions noted in her wounds to try to help speed up the healing process here. Amy Moses, Amy Moses ([id number]) 04/05/2021 upon evaluation today patient appears to be doing decently well in general in regard to her wounds. She was unfortunately in the hospital from the 10th through the 12th of this month due to having high fever and subsequently they did not actually determine exactly what was going on they thought she might of just been sick or had some kind of a cold. With that being said fortunately there does not appear to be any evidence of active infection at this time which is great news. No fevers, chills, nausea, vomiting, or diarrhea. Objective Constitutional Well-nourished and well-hydrated in no acute distress. Vitals Time Taken: 11:10 AM, Height: 66 in, Weight: 235 lbs, BMI: 37.9, Temperature: 97.1 F, Pulse: 72 bpm, Respiratory Rate: 18 breaths/min, Blood Pressure: 114/69 mmHg. Respiratory normal breathing without difficulty. Psychiatric this patient is able to make decisions and demonstrates good insight  into disease process. Alert and Oriented x 3. pleasant and cooperative. General Notes: Upon inspection patient's wound bed actually showed signs of good granulation epithelization at this point. There does not appear to be any signs of infection and in fact all of her wounds are doing quite well she does have some areas and required debridement in regard to the right lateral leg as well as the right heel. Both of these I did perform sharp debridement of a clear away some of the necrotic debris. Also did clear away the left upper leg where there were several patches of necrotic tissue to help the wound VAC more effectively work. Integumentary (Hair, Skin) Wound #1 status is Open. Original cause of wound was Gradually Appeared. The date acquired was: 07/22/2020. The wound has been in treatment 17 weeks. The wound is located on the Right,Medial Upper Leg. The wound measures 14cm length x 14cm width x 0.6cm depth; 153.938cm^2 area and 92.363cm^3 volume. There is Fat Layer (Subcutaneous Tissue) exposed. There is no tunneling or undermining noted. There is a medium amount of serosanguineous drainage noted. There is large (67-100%) red granulation within the wound bed. There is no necrotic tissue within the wound bed. Wound #10 status is Open. Original cause of wound was Gradually Appeared. The date acquired was: 02/16/2021. The wound has been in treatment 6 weeks. The wound is located on the Right,Lateral Calf. The wound measures 2cm length x 1.5cm width x 0.2cm depth; 2.356cm^2 area and 0.471cm^3 volume. There is Fat Layer (Subcutaneous Tissue) exposed. There is no tunneling or undermining noted. There is a none present amount of drainage noted. There is small (1-33%) pink granulation within the wound bed. There is  a large (67-100%) amount of necrotic tissue within the wound bed including Eschar. Wound #11 status is Open. Original cause of wound was Gradually Appeared. The date acquired was: 03/21/2021. The  wound is located on the Right,Distal,Posterior Lower Leg. The wound measures 2.5cm length x 2cm width x 0.1cm depth; 3.927cm^2 area and 0.393cm^3 volume. There is Fat Layer (Subcutaneous Tissue) exposed. There is no tunneling or undermining noted. There is a medium amount of serosanguineous drainage noted. There is small (1-33%) pink granulation within the wound bed. There is a large (67-100%) amount of necrotic tissue within the wound bed including Adherent Slough. Wound #2 status is Open. Original cause of wound was Gradually Appeared. The date acquired was: 07/22/2020. The wound has been in treatment 17 weeks. The wound is located on the Left,Medial Upper Leg. The wound measures 12cm length x 3.2cm width x 0.9cm depth; 30.159cm^2 area and 27.143cm^3 volume. There is Fat Layer (Subcutaneous Tissue) exposed. There is no tunneling or undermining noted. There is a large amount of serosanguineous drainage noted. There is large (67-100%) red granulation within the wound bed. There is a small (1-33%) amount of necrotic tissue within the wound bed including Adherent Slough. Wound #3 status is Open. Original cause of wound was Gradually Appeared. The date acquired was: 07/22/2020. The wound has been in treatment 17 weeks. The wound is located on the Left,Lateral Upper Leg. The wound measures 1.5cm length x 11cm width x 0.7cm depth; 12.959cm^2 area and 9.071cm^3 volume. There is Fat Layer (Subcutaneous Tissue) exposed. There is no tunneling or undermining noted. There is a large amount of serosanguineous drainage noted. There is medium (34-66%) red granulation within the wound bed. There is a medium (34-66%) amount of necrotic tissue within the wound bed including Adherent Slough. Wound #4 status is Open. Original cause of wound was Gradually Appeared. The date acquired was: 10/21/2020. The wound has been in treatment 17 weeks. The wound is located on the Right Calcaneus. The wound measures 5cm length x 6.5cm  width x 0.2cm depth; 25.525cm^2 area and 5.105cm^3 volume. There is Fat Layer (Subcutaneous Tissue) exposed. There is no tunneling or undermining noted. There is a medium amount of serosanguineous drainage noted. There is small (1-33%) red granulation within the wound bed. There is a large (67-100%) amount of necrotic tissue within the wound bed including Eschar and Adherent Slough. Wound #6 status is Open. Original cause of wound was Gradually Appeared. The date acquired was: 12/22/2020. The wound has been in treatment 11 weeks. The wound is located on the Right Gluteus. The wound measures 0.7cm length x 1cm width x 0.1cm depth; 0.55cm^2 area and 0.055cm^3 volume. There is Fat Layer (Subcutaneous Tissue) exposed. There is no tunneling or undermining noted. There is a medium amount of serosanguineous drainage noted. There is large (67-100%) red, pink granulation within the wound bed. There is no necrotic tissue within the wound bed. Amy Moses, Amy Moses ([id number]) Wound #8 status is Open. Original cause of wound was Gradually Appeared. The date acquired was: 12/22/2020. The wound has been in treatment 11 weeks. The wound is located on the Left Gluteus. The wound measures 4.5cm length x 3cm width x 0.1cm depth; 10.603cm^2 area and 1.06cm^3 volume. There is Fat Layer (Subcutaneous Tissue) exposed. There is no tunneling or undermining noted. There is a medium amount of serosanguineous drainage noted. There is medium (34-66%) red granulation within the wound bed. There is a medium (34-66%) amount of necrotic tissue within the wound bed including Adherent Slough. Wound #9 status  is Open. Original cause of wound was Gradually Appeared. The date acquired was: 02/16/2021. The wound has been in treatment 6 weeks. The wound is located on the Left,Lateral Calf. The wound measures 6cm length x 3cm width x 0.1cm depth; 14.137cm^2 area and 1.414cm^3 volume. There is Fat Layer (Subcutaneous Tissue) exposed. There is no  tunneling or undermining noted. There is a none present amount of drainage noted. There is small (1-33%) pink granulation within the wound bed. There is a large (67-100%) amount of necrotic tissue within the wound bed including Eschar and Adherent Slough. 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 There was a Excisional Skin/Subcutaneous Tissue Debridement with a total area of 5 sq cm performed by Tommie Sams., PA-C. With the following instrument(s): Forceps, tweezers Material removed includes Eschar and Subcutaneous Tissue and. A time out was conducted at 11:59, prior to the start of the procedure. A Moderate amount of bleeding was controlled with Pressure. The procedure was tolerated well. Post Debridement Measurements: 2.5cm length x 2cm width x 0.2cm depth; 0.785cm^3 volume. Character of Wound/Ulcer Post Debridement is improved. Post procedure Diagnosis Wound #11: Same as Pre-Procedure Wound #3 Pre-procedure diagnosis of Wound #3 is a Calciphylaxis located on the Left,Lateral Upper Leg . There was a Excisional Skin/Subcutaneous Tissue Debridement with a total area of 4 sq cm performed by Tommie Sams., PA-C. With the following instrument(s): Forceps, tweezer Material removed includes Subcutaneous Tissue and Slough and. A time out was conducted at 11:56, prior to the start of the procedure. A Minimum amount of bleeding was controlled with Pressure. The procedure was tolerated well. Post Debridement Measurements: 2cm  length x 2cm width x 0.8cm depth; 2.513cm^3 volume. Character of Wound/Ulcer Post Debridement is improved. Post procedure Diagnosis Wound #3: Same as Pre-Procedure Wound #4 Pre-procedure diagnosis of Wound #4 is a Pressure Ulcer located on the Right Calcaneus . There was a Excisional Skin/Subcutaneous Tissue Debridement with a total area of 32.5 sq cm performed by Tommie Sams., PA-C. With the following instrument(s): Forceps, tweezers Material removed includes Eschar and Subcutaneous Tissue and. A time out was conducted at 12:00, prior to the start of the procedure. A Moderate amount of bleeding was controlled with Pressure. The procedure was tolerated well. Post Debridement Measurements: 5cm length x 6.5cm width x 0.3cm depth; 7.658cm^3 volume. Post debridement Stage noted as Unstageable/Unclassified. Character of Wound/Ulcer Post Debridement is improved. Post procedure Diagnosis Wound #4: Same as Pre-Procedure Plan Follow-up Appointments: Return Appointment in 2 weeks. Home Health: [place]: - [facility] Amy Moses, Amy Moses ([id number]) [place] for wound care. May utilize formulary equivalent dressing for wound treatment orders unless otherwise specified. Home Health Nurse may visit PRN to address patient s wound care needs. - 3 x a week Negative Pressure Wound Therapy: Wound #1 Right,Medial Upper Leg: Wound VAC settings at 192mHg continuous pressure. Use foam to wound cavity. Please order WHITE foam to fill any tunnel/s and/or undermining when necessary. Change VAC dressing 3 X WEEK. Change canister as indicated when full. - no white foam ! Home Health Nurse may d/c VAC for s/s of  increased infection, significant wound regression, or uncontrolled drainage. [place] at [phone]. - MUST NOTIFY THIS CLINIC ASAP Wound #2 Left,Medial Upper Leg: Wound VAC settings at 181mHg continuous pressure. Use foam to wound cavity. Please order WHITE foam to  fill any tunnel/s and/or undermining when necessary. Change VAC dressing 3 X WEEK. Change canister as indicated when full. - no white foam ! Home Health Nurse may d/c VAC for s/s of increased infection, significant wound regression, or uncontrolled drainage. [place] [phone] - MUST NOTIFY THIS CLINIC ASAP Wound #3 Left,Lateral Upper Leg: Wound VAC settings at 1262mg continuous pressure. Use foam to wound cavity. Please order WHITE foam to fill any tunnel/s and/or undermining when necessary. Change VAC dressing 3 X WEEK. Change canister as indicated when full. - no white foam ! Home Health Nurse may d/c VAC for s/s of increased infection, significant wound regression, or uncontrolled drainage. [place] [phone]. - MUST NOTIFY THIS CLINIC ASAP WOUND #10: - Calf Wound Laterality: Right, Lateral Primary Dressing: Silvercel 4 1/4x 4 1/4 (in/in) (Home Health) 3 x Per Week/30 Days Discharge Instructions: Apply Silvercel 4 1/4x 4 1/4 (in/in) as instructed WOUND #4: - Calcaneus Wound Laterality: Right Topical: Betadine (Home Health) (Generic) 3 x Per Week/30 Days Discharge Instructions: apply to eascar Primary Dressing: Silvercel Small 2x2 (in/in) (Home Health) 3 x Per Week/30 Days Discharge Instructions: open areas Secondary Dressing: Mepilex Border Flex, 4x4 (in/in) (Home Health) (Generic) 3 x Per Week/30 Days Discharge Instructions: Apply to wound as directed. Do not cut. WOUND #6: - Gluteus Wound Laterality: Right Peri-Wound Care: Desitin Maximum Strength Ointment 4 (oz) (Home Health) (Generic) 3 x Per Week/30 Days Peri-Wound Care: Skin Prep (Home Health) (Generic) 3 x Per Week/30 Days Discharge Instructions: Use skin prep as directed Primary Dressing: Silvercel Small 2x2 (in/in) (Home Health) (Generic) 3 x Per Week/30 Days Discharge Instructions: Apply Silvercel Small 2x2 (in/in) as instructed Secondary Dressing: Mepilex Border Flex, 4x4  (in/in) (Home Health) (Generic) 3 x Per Week/30 Days Discharge Instructions: Apply to wound as directed. Do not cut. WOUND #8: - Gluteus Wound Laterality: Left Primary Dressing: Silvercel Small 2x2 (in/in) (Home Health) (Generic) 3 x Per Week/30 Days Discharge Instructions: Apply Silvercel Small 2x2 (in/in) as instructed Secondary Dressing: Mepilex Border Flex, 4x4 (in/in) (Generic) 3 x Per Week/30 Days Discharge Instructions: Apply to wound as directed. Do not cut. WOUND #9: - Calf Wound Laterality: Left, Lateral Topical: Betadine (Home Health) 3 x Per Week/30 Days 1. Would recommend currently that we going to continue with the wound care measures as before and the patient is in agreement with the plan. This includes the use of Betadine to the left lateral leg which again I think is the one area that were still not quite ready to remove the eschar from. Everything else is going to require silver alginate. 2. With regard to the main wounds on the upper legs we will still get a continue with the wound VAC I think that is doing a great job and overall the patient seems to making great progress. We will see patient back for reevaluation in 2 weeks here in the clinic. If anything worsens or changes patient will contact our office for additional recommendations. Electronic Signature(s) Signed: 04/05/2021 3:27:31 PM By: StWorthy KeelerA-C Entered By: StWorthy Keelern 04/05/2021 15:27:31 MIFawn [id number]-------------------------------------------------------------------------------- SuperBill Details Patient Name: MIFawn Kirkate of Service: 04/05/2021 Medical Record Number: [id number] Account Number: [id number]  of Birth/Sex: Dec 30, 1970 (50 y.o. 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 L89.610 Pressure ulcer of right heel, unstageable E11.42 Type 2 diabetes mellitus with diabetic polyneuropathy L89.313 Pressure ulcer of right buttock, stage 3 L89.323 Pressure ulcer of left buttock, stage 3 Facility Procedures CPT4 Code: [id number] Description: [id number] - DEB SUBQ TISSUE 20 SQ CM/< Modifier: Quantity: 1 CPT4 Code: Description: ICD-10 Diagnosis Description L89.610 Pressure ulcer of right heel, unstageable Modifier: Quantity: CPT4 Code: [id number] Description: [id number] - DEB SUBQ TISS EA ADDL 20CM Modifier: Quantity: 2 CPT4 Code: Description: ICD-10 Diagnosis Description L89.610 Pressure ulcer of right heel, unstageable Modifier: Quantity: Physician Procedures CPT4 [id number] Description: 11042 - WC PHYS SUBQ TISS 20 SQ CM Modifier: Quantity: 1 CPT4 Code: Description: ICD-10 Diagnosis Description L89.610 Pressure ulcer of right heel, unstageable Modifier: Quantity: CPT4 [id number] Description: 11045 - WC PHYS SUBQ TISS EA ADDL 20 CM Modifier: Quantity: 2 CPT4 Code: Description: ICD-10 Diagnosis Description L89.610 Pressure ulcer of right heel, unstageable Modifier: Quantity: Electronic Signature(s) Signed: 04/05/2021 3:28:08 PM By: Worthy Keeler PA-C Entered By: Worthy Keeler on 04/05/2021 15:28:07

## 2021-04-06 NOTE — Progress Notes (Signed)
AADHYA, HIRA ([id number]) Visit Report for 04/05/2021 Arrival Information Details Patient Name: Amy Moses, Amy Moses Date of Service: 04/05/2021 10:45 AM Medical Record Number: [id number] Patient Account Number: [id number] Date of Birth/Sex: 08/29/1971 (50 y.o. F) Treating RN: Carlene Coria Primary Care Jeramy Dimmick: Zenon Mayo Other Clinician: Referring Naira Standiford: Zenon Mayo Treating Daishawn Lauf/Extender: Skipper Cliche in Treatment: 1 Visit Information History Since Last Visit All ordered tests and consults were completed: No Patient Arrived: Wheel Chair Added or deleted any medications: No Arrival Time: 11:11 Any new allergies or adverse reactions: No Accompanied By: self Had a fall or experienced change in No Transfer Assistance: None activities of daily living that may affect Patient Identification Verified: Yes risk of falls: Secondary Verification Process Completed: Yes Signs or symptoms of abuse/neglect since last visito No Patient Requires Transmission-Based Precautions: No Hospitalized since last visit: No Patient Has Alerts: No Implantable device outside of the clinic excluding No cellular tissue based products placed in the center since last visit: Has Dressing in Place as Prescribed: Yes Pain Present Now: No Electronic Signature(s) Signed: 04/05/2021 8:20:21 PM By: Carlene Coria RN Entered By: Carlene Coria on 04/05/2021 11:12:16 Amy Moses ([id number]) -------------------------------------------------------------------------------- Clinic Level of Care Assessment Details Patient Name: Amy Moses Date of Service: 04/05/2021 10:45 AM Medical Record Number: [id number] Patient Account Number: [id number] Date of Birth/Sex: [birth year] (50 y.o. F) Treating RN: Carlene Coria Primary Care Jennene Downie: Zenon Mayo Other Clinician: Referring Kean Gautreau: Zenon Mayo Treating Douglas Smolinsky/Extender: Skipper Cliche in Treatment: 17 Clinic Level of Care Assessment  Items TOOL 1 Quantity Score '[]'$  - Use when EandM and Procedure is performed on INITIAL visit 0 ASSESSMENTS - Nursing Assessment / Reassessment '[]'$  - General Physical Exam (combine w/ comprehensive assessment (listed just below) when performed on new 0 pt. evals) '[]'$  - 0 Comprehensive Assessment (HX, ROS, Risk Assessments, Wounds Hx, etc.) ASSESSMENTS - Wound and Skin Assessment / Reassessment '[]'$  - Dermatologic / Skin Assessment (not related to wound area) 0 ASSESSMENTS - Ostomy and/or Continence Assessment and Care '[]'$  - Incontinence Assessment and Management 0 '[]'$  - 0 Ostomy Care Assessment and Management (repouching, etc.) PROCESS - Coordination of Care '[]'$  - Simple Patient / Family Education for ongoing care 0 '[]'$  - 0 Complex (extensive) Patient / Family Education for ongoing care '[]'$  - 0 Staff obtains Programmer, systems, Records, Test Results / Process Orders '[]'$  - 0 Staff telephones HHA, Nursing Homes / Clarify orders / etc '[]'$  - 0 Routine Transfer to another Facility (non-emergent condition) '[]'$  - 0 Routine Hospital Admission (non-emergent condition) '[]'$  - 0 New Admissions / Biomedical engineer / Ordering NPWT, Apligraf, etc. '[]'$  - 0 Emergency Hospital Admission (emergent condition) PROCESS - Special Needs '[]'$  - Pediatric / Minor Patient Management 0 '[]'$  - 0 Isolation Patient Management '[]'$  - 0 Hearing / Language / Visual special needs '[]'$  - 0 Assessment of Community assistance (transportation, D/C planning, etc.) '[]'$  - 0 Additional assistance / Altered mentation '[]'$  - 0 Support Surface(s) Assessment (bed, cushion, seat, etc.) INTERVENTIONS - Miscellaneous '[]'$  - External ear exam 0 '[]'$  - 0 Patient Transfer (multiple staff / Civil Service fast streamer / Similar devices) '[]'$  - 0 Simple Staple / Suture removal (25 or less) '[]'$  - 0 Complex Staple / Suture removal (26 or more) '[]'$  - 0 Hypo/Hyperglycemic Management (do not check if billed separately) '[]'$  - 0 Ankle / Brachial Index (ABI) - do not check if  billed separately Has the patient been seen at the hospital within the last three years: Yes Total Score: 0 Level Of Care: ____  OVA, BOLHUIS ([id number]) Electronic Signature(s) Signed: 04/05/2021 8:20:21 PM By: Carlene Coria RN Entered By: Carlene Coria on 04/05/2021 11:52:17 Amy Moses ([id number]) -------------------------------------------------------------------------------- Encounter Discharge Information Details Patient Name: Amy Moses Date of Service: 04/05/2021 10:45 AM Medical Record Number: [id number] Patient Account Number: [id number] Date of Birth/Sex: [birth year] (50 y.o. F) Treating RN: Donnamarie Poag Primary Care Pablo Mathurin: Zenon Mayo Other Clinician: Referring Chaunta Bejarano: Zenon Mayo Treating Isobelle Tuckett/Extender: Skipper Cliche in Treatment: 17 Encounter Discharge Information Items Post Procedure Vitals Discharge Condition: Stable Temperature (F): 97.1 Ambulatory Status: Wheelchair Pulse (bpm): 72 Discharge Destination: Home Respiratory Rate (breaths/min): 18 Transportation: Private Auto Blood Pressure (mmHg): 114/69 Accompanied By: daughter in lobby Schedule Follow-up Appointment: Yes Clinical Summary of Care: Electronic Signature(s) Signed: 04/05/2021 5:31:07 PM By: Jeanine Luz Entered By: Jeanine Luz on 04/05/2021 12:51:52 Amy Moses ([id number]) -------------------------------------------------------------------------------- Lower Extremity Assessment Details Patient Name: Amy Moses Date of Service: 04/05/2021 10:45 AM Medical Record Number: [id number] Patient Account Number: [id number] Date of Birth/Sex: 04/26/1971 (50 y.o. F) Treating RN: Carlene Coria Primary Care Mazi Schuff: Zenon Mayo Other Clinician: Referring Tata Timmins: Zenon Mayo Treating Keyara Ent/Extender: Skipper Cliche in Treatment: 17 Vascular Assessment Pulses: Dorsalis Pedis Palpable: [Left:Yes] [Right:Yes] Electronic Signature(s) Signed:  04/05/2021 8:20:21 PM By: Carlene Coria RN Entered By: Carlene Coria on 04/05/2021 12:42:01 Amy Moses ([id number]) -------------------------------------------------------------------------------- Multi Wound Chart Details Patient Name: Amy Moses Date of Service: 04/05/2021 10:45 AM Medical Record Number: [id number] Patient Account Number: [id number] Date of Birth/Sex: 07-19-1971 (50 y.o. F) Treating RN: Carlene Coria Primary Care [place] Kretz: Zenon Mayo Other Clinician: Referring Honest Vanleer: Zenon Mayo Treating Gurnie Duris/Extender: Skipper Cliche in Treatment: 17 Vital Signs Height(in): 66 Pulse(bpm): 72 Weight(lbs): 235 Blood Pressure(mmHg): 114/69 Body Mass Index(BMI): 38 Temperature(F): 97.1 Respiratory Rate(breaths/min): 18 Wound Assessments Treatment Notes Electronic Signature(s) Signed: 04/05/2021 8:20:21 PM By: Carlene Coria RN Entered By: Carlene Coria on 04/05/2021 11:46:32 Amy Moses ([id number]) -------------------------------------------------------------------------------- [place] Details Patient Name: Amy Moses Date of Service: 04/05/2021 10:45 AM Medical Record Number: [id number] Patient Account Number: [id number] Date of Birth/Sex: 08/13/1971 (49 y.o. F) Treating RN: Carlene Coria Primary Care Trampas Stettner: Zenon Mayo Other Clinician: Referring Angelee Bahr: Zenon Mayo Treating Harly Pipkins/Extender: Skipper Cliche in Treatment: 17 Active Inactive Electronic Signature(s) Signed: 04/05/2021 12:44:03 PM By: Donnamarie Poag Signed: 04/05/2021 8:20:21 PM By: Carlene Coria RN Entered By: Donnamarie Poag on 04/05/2021 12:44:02 Amy Moses ([id number]) -------------------------------------------------------------------------------- Pain Assessment Details Patient Name: Amy Moses Date of Service: 04/05/2021 10:45 AM Medical Record Number: [id number] Patient Account Number: [id number] Date of Birth/Sex: 12-13-1970 (49 y.o.  F) Treating RN: Carlene Coria Primary Care Brittainy Bucker: Zenon Mayo Other Clinician: Referring Champayne Kocian: Zenon Mayo Treating Kameisha Malicki/Extender: Skipper Cliche in Treatment: 17 Active Problems Location of Pain Severity and Description of Pain Patient Has Paino No Site Locations Pain Management and Medication Current Pain Management: Electronic Signature(s) Signed: 04/05/2021 8:20:21 PM By: Carlene Coria RN Entered By: Carlene Coria on 04/05/2021 11:12:47 Amy Moses ([id number]) -------------------------------------------------------------------------------- Patient/Caregiver Education Details Patient Name: Amy Moses Date of Service: 04/05/2021 10:45 AM Medical Record Number: [id number] Patient Account Number: [id number] Date of Birth/Gender: [birth year] (50 y.o. F) Treating RN: Carlene Coria Primary Care Physician: Zenon Mayo Other Clinician: Referring Physician: Zenon Mayo Treating Physician/Extender: Skipper Cliche in Treatment: 17 Education Assessment Education Provided To: Patient Education Topics Provided Basic Hygiene: Wound Debridement: Wound/Skin Impairment: Electronic Signature(s) Signed: 04/05/2021 8:20:21 PM By: Carlene Coria RN Entered By: Carlene Coria on 04/05/2021 11:52:52 Amy Moses ([id number]) -------------------------------------------------------------------------------- Wound Assessment Details Patient Name: Amy Moses Date of Service: 04/05/2021 10:45 AM Medical  Record Number: [id number] Patient Account Number: [id number] Date of Birth/Sex: 04-Oct-1971 (49 y.o. F) Treating RN: Carlene Coria Primary Care Marketta Valadez: Zenon Mayo Other Clinician: Referring Niko Jakel: Zenon Mayo Treating Brecken Dewoody/Extender: Skipper Cliche in Treatment: 17 Wound Status Wound Number: 1 Primary Calciphylaxis Etiology: Wound Location: Right, Medial Upper Leg Wound Open Wounding Event: Gradually Appeared Status: Date Acquired:  07/22/2020 Comorbid Cataracts, Glaucoma, Anemia, Hypertension, Type II Weeks Of Treatment: 17 History: Diabetes, End Stage Renal Disease, Neuropathy Clustered Wound: No Photos Wound Measurements Length: (cm) 14 Width: (cm) 14 Depth: (cm) 0.6 Area: (cm) 153.938 Volume: (cm) 92.363 % Reduction in Area: 31.5% % Reduction in Volume: -311.2% Epithelialization: Large (67-100%) Tunneling: No Undermining: No Wound Description Classification: Full Thickness Without Exposed Support Structu Exudate Amount: Medium Exudate Type: Serosanguineous Exudate Color: red, brown res Foul Odor After Cleansing: No Slough/Fibrino No Wound Bed Granulation Amount: Large (67-100%) Exposed Structure Granulation Quality: Red Fascia Exposed: No Necrotic Amount: None Present (0%) Fat Layer (Subcutaneous Tissue) Exposed: Yes Tendon Exposed: No Muscle Exposed: No Joint Exposed: No Bone Exposed: No Treatment Notes Wound #1 (Upper Leg) Wound Laterality: Right, Medial Cleanser Peri-Wound Care Topical Primary Dressing Amy Moses, Amy Moses ([id number]) Secondary Dressing Secured With Compression Wrap Compression Stockings Add-Ons Electronic Signature(s) Signed: 04/05/2021 8:20:21 PM By: Carlene Coria RN Entered By: Carlene Coria on 04/05/2021 12:37:57 Amy Moses ([id number]) -------------------------------------------------------------------------------- Wound Assessment Details Patient Name: Amy Moses Date of Service: 04/05/2021 10:45 AM Medical Record Number: [id number] Patient Account Number: [id number] Date of Birth/Sex: [birth year] (50 y.o. F) Treating RN: Carlene Coria Primary Care Klea Nall: Zenon Mayo Other Clinician: Referring Ryken Paschal: Zenon Mayo Treating Jasemine Nawaz/Extender: Skipper Cliche in Treatment: 17 Wound Status Wound Number: 10 Primary Calciphylaxis Etiology: Wound Location: Right, Lateral Calf Wound Open Wounding Event: Gradually Appeared Status: Date  Acquired: 02/16/2021 Comorbid Cataracts, Glaucoma, Anemia, Hypertension, Type II Weeks Of Treatment: 6 History: Diabetes, End Stage Renal Disease, Neuropathy Clustered Wound: No Photos Wound Measurements Length: (cm) 2 Width: (cm) 1.5 Depth: (cm) 0.2 Area: (cm) 2.356 Volume: (cm) 0.471 % Reduction in Area: -9.1% % Reduction in Volume: -118.1% Epithelialization: None Tunneling: No Undermining: No Wound Description Classification: Full Thickness Without Exposed Support Structure Exudate Amount: None Present s Foul Odor After Cleansing: No Slough/Fibrino No Wound Bed Granulation Amount: Small (1-33%) Exposed Structure Granulation Quality: Pink Fascia Exposed: No Necrotic Amount: Large (67-100%) Fat Layer (Subcutaneous Tissue) Exposed: Yes Necrotic Quality: Eschar Tendon Exposed: No Muscle Exposed: No Joint Exposed: No Bone Exposed: No Treatment Notes Wound #10 (Calf) Wound Laterality: Right, Lateral Cleanser Peri-Wound Care Topical Primary Dressing Silvercel 4 1/4x 4 1/4 (in/in) Amy Moses, Amy Moses ([id number]) Discharge Instruction: Apply Silvercel 4 1/4x 4 1/4 (in/in) as instructed Secondary Dressing Secured With Compression Wrap Compression Stockings Add-Ons Electronic Signature(s) Signed: 04/05/2021 8:20:21 PM By: Carlene Coria RN Entered By: Carlene Coria on 04/05/2021 12:35:50 Amy Moses ([id number]) -------------------------------------------------------------------------------- Wound Assessment Details Patient Name: Amy Moses Date of Service: 04/05/2021 10:45 AM Medical Record Number: [id number] Patient Account Number: [id number] Date of Birth/Sex: 08/03/1971 (50 y.o. F) Treating RN: Carlene Coria Primary Care Jaheim Canino: Zenon Mayo Other Clinician: Referring Orlene Salmons: Zenon Mayo Treating Adair Lemar/Extender: Skipper Cliche in Treatment: 17 Wound Status Wound Number: 11 Primary Calciphylaxis Etiology: Wound Location: Right, Distal,  Posterior Lower Leg Wound Open Wounding Event: Gradually Appeared Status: Date Acquired: 03/21/2021 Comorbid Cataracts, Glaucoma, Anemia, Hypertension, Type II Weeks Of Treatment: 0 History: Diabetes, End Stage Renal Disease, Neuropathy Clustered Wound: No Photos Wound Measurements Length: (cm) 2.5 Width: (cm) 2 Depth: (cm) 0.1 Area: (cm) 3.927 Volume: (  cm) 0.393 % Reduction in Area: % Reduction in Volume: Epithelialization: None Tunneling: No Undermining: No Wound Description Classification: Full Thickness Without Exposed Support Structu Exudate Amount: Medium Exudate Type: Serosanguineous Exudate Color: red, brown res Foul Odor After Cleansing: No Slough/Fibrino Yes Wound Bed Granulation Amount: Small (1-33%) Exposed Structure Granulation Quality: Pink Fascia Exposed: No Necrotic Amount: Large (67-100%) Fat Layer (Subcutaneous Tissue) Exposed: Yes Necrotic Quality: Adherent Slough Tendon Exposed: No Muscle Exposed: No Joint Exposed: No Bone Exposed: No Treatment Notes Wound #11 (Lower Leg) Wound Laterality: Right, Posterior, Distal Cleanser Peri-Wound Care Topical Primary Dressing KAHLIAH, AXELROD ([id number]) Secondary Dressing Secured With Compression Wrap Compression Stockings Add-Ons Electronic Signature(s) Signed: 04/05/2021 8:20:21 PM By: Carlene Coria RN Entered By: Carlene Coria on 04/05/2021 12:34:55 Amy Moses ([id number]) -------------------------------------------------------------------------------- Wound Assessment Details Patient Name: Amy Moses Date of Service: 04/05/2021 10:45 AM Medical Record Number: [id number] Patient Account Number: [id number] Date of Birth/Sex: Nov 03, 1971 (50 y.o. F) Treating RN: Carlene Coria Primary Care Mazel Villela: Zenon Mayo Other Clinician: Referring Erza Mothershead: Zenon Mayo Treating Warwick Nick/Extender: Skipper Cliche in Treatment: 17 Wound Status Wound Number: 2 Primary  Calciphylaxis Etiology: Wound Location: Left, Medial Upper Leg Wound Open Wounding Event: Gradually Appeared Status: Date Acquired: 07/22/2020 Comorbid Cataracts, Glaucoma, Anemia, Hypertension, Type II Weeks Of Treatment: 17 History: Diabetes, End Stage Renal Disease, Neuropathy Clustered Wound: No Photos Wound Measurements Length: (cm) 12 Width: (cm) 3.2 Depth: (cm) 0.9 Area: (cm) 30.159 Volume: (cm) 27.143 % Reduction in Area: 75.4% % Reduction in Volume: -121.5% Epithelialization: None Tunneling: No Undermining: No Wound Description Classification: Full Thickness Without Exposed Support Structu Exudate Amount: Large Exudate Type: Serosanguineous Exudate Color: red, brown res Foul Odor After Cleansing: No Slough/Fibrino Yes Wound Bed Granulation Amount: Large (67-100%) Exposed Structure Granulation Quality: Red Fascia Exposed: No Necrotic Amount: Small (1-33%) Fat Layer (Subcutaneous Tissue) Exposed: Yes Necrotic Quality: Adherent Slough Tendon Exposed: No Muscle Exposed: No Joint Exposed: No Bone Exposed: No Treatment Notes Wound #2 (Upper Leg) Wound Laterality: Left, Medial Cleanser Peri-Wound Care Topical Primary Dressing CAYLEEN, YOUN ([id number]) Secondary Dressing Secured With Compression Wrap Compression Stockings Add-Ons Electronic Signature(s) Signed: 04/05/2021 8:20:21 PM By: Carlene Coria RN Entered By: Carlene Coria on 04/05/2021 12:38:50 Amy Moses ([id number]) -------------------------------------------------------------------------------- Wound Assessment Details Patient Name: Amy Moses Date of Service: 04/05/2021 10:45 AM Medical Record Number: [id number] Patient Account Number: [id number] Date of Birth/Sex: 10-26-1971 (50 y.o. F) Treating RN: Carlene Coria Primary Care Andria Head: Zenon Mayo Other Clinician: Referring Keyarra Rendall: Zenon Mayo Treating Sakira Dahmer/Extender: Skipper Cliche in Treatment: 17 Wound  Status Wound Number: 3 Primary Calciphylaxis Etiology: Wound Location: Left, Lateral Upper Leg Wound Open Wounding Event: Gradually Appeared Status: Date Acquired: 07/22/2020 Comorbid Cataracts, Glaucoma, Anemia, Hypertension, Type II Weeks Of Treatment: 17 History: Diabetes, End Stage Renal Disease, Neuropathy Clustered Wound: No Photos Wound Measurements Length: (cm) 1.5 Width: (cm) 11 Depth: (cm) 0.7 Area: (cm) 12.959 Volume: (cm) 9.071 % Reduction in Area: 48.4% % Reduction in Volume: -261% Epithelialization: None Tunneling: No Undermining: No Wound Description Classification: Full Thickness Without Exposed Support Structu Exudate Amount: Large Exudate Type: Serosanguineous Exudate Color: red, brown res Foul Odor After Cleansing: No Slough/Fibrino Yes Wound Bed Granulation Amount: Medium (34-66%) Exposed Structure Granulation Quality: Red Fascia Exposed: No Necrotic Amount: Medium (34-66%) Fat Layer (Subcutaneous Tissue) Exposed: Yes Necrotic Quality: Adherent Slough Tendon Exposed: No Muscle Exposed: No Joint Exposed: No Bone Exposed: No Treatment Notes Wound #3 (Upper Leg) Wound Laterality: Left, Lateral Cleanser Peri-Wound Care Topical Primary Dressing Amy Moses, Amy Moses ([id number]) Secondary Dressing Secured  With Compression Wrap Compression Stockings Add-Ons Electronic Signature(s) Signed: 04/05/2021 8:20:21 PM By: Carlene Coria RN Entered By: Carlene Coria on 04/05/2021 12:39:42 Amy Moses ([id number]) -------------------------------------------------------------------------------- Wound Assessment Details Patient Name: Amy Moses Date of Service: 04/05/2021 10:45 AM Medical Record Number: [id number] Patient Account Number: [id number] Date of Birth/Sex: February 11, 1971 (50 y.o. F) Treating RN: Carlene Coria Primary Care Dasja Brase: Zenon Mayo Other Clinician: Referring Edwar Coe: Zenon Mayo Treating Quinterrius Errington/Extender: Skipper Cliche  in Treatment: 17 Wound Status Wound Number: 4 Primary Pressure Ulcer Etiology: Wound Location: Right Calcaneus Wound Open Wounding Event: Gradually Appeared Status: Date Acquired: 10/21/2020 Comorbid Cataracts, Glaucoma, Anemia, Hypertension, Type II Weeks Of Treatment: 17 History: Diabetes, End Stage Renal Disease, Neuropathy Clustered Wound: No Photos Wound Measurements Length: (cm) 5 % Redu Width: (cm) 6.5 % Redu Depth: (cm) 0.2 Epithe Area: (cm) 25.525 [place]ne Volume: (cm) 5.105 Under ction in Area: 9.7% ction in Volume: -80.6% lialization: None ling: No mining: No Wound Description Classification: Unstageable/Unclassified Foul Exudate Amount: Medium Sloug Exudate Type: Serosanguineous Exudate Color: red, brown Odor After Cleansing: No h/Fibrino Yes Wound Bed Granulation Amount: Small (1-33%) Exposed Structure Granulation Quality: Red Fascia Exposed: No Necrotic Amount: Large (67-100%) Fat Layer (Subcutaneous Tissue) Exposed: Yes Necrotic Quality: Eschar, Adherent Slough Tendon Exposed: No Muscle Exposed: No Joint Exposed: No Bone Exposed: No Treatment Notes Wound #4 (Calcaneus) Wound Laterality: Right Cleanser Peri-Wound Care Topical Betadine Amy Moses, Amy Moses ([id number]) Discharge Instruction: apply to eascar Primary Dressing Silvercel Small 2x2 (in/in) Discharge Instruction: open areas Secondary Dressing Mepilex Border Flex, 4x4 (in/in) Discharge Instruction: Apply to wound as directed. Do not cut. Secured With Compression Wrap Compression Stockings Add-Ons Electronic Signature(s) Signed: 04/05/2021 8:20:21 PM By: Carlene Coria RN Entered By: Carlene Coria on 04/05/2021 12:33:15 Amy Moses ([id number]) -------------------------------------------------------------------------------- Wound Assessment Details Patient Name: Amy Moses Date of Service: 04/05/2021 10:45 AM Medical Record Number: [id number] Patient Account Number:  [id number] Date of Birth/Sex: [birth year] (50 y.o. F) Treating RN: Carlene Coria Primary Care Everado Pillsbury: Zenon Mayo Other Clinician: Referring Kaycee Haycraft: Zenon Mayo Treating Cebastian Neis/Extender: Skipper Cliche in Treatment: 17 Wound Status Wound Number: 6 Primary Pressure Ulcer Etiology: Wound Location: Right Gluteus Wound Open Wounding Event: Gradually Appeared Status: Date Acquired: 12/22/2020 Comorbid Cataracts, Glaucoma, Anemia, Hypertension, Type II Weeks Of Treatment: 11 History: Diabetes, End Stage Renal Disease, Neuropathy Clustered Wound: Yes Photos Wound Measurements Length: (cm) 0.7 Width: (cm) 1 Depth: (cm) 0.1 Clustered Quantity: 2 Area: (cm) 0.55 Volume: (cm) 0.055 % Reduction in Area: 99.1% % Reduction in Volume: 99.1% Epithelialization: Large (67-100%) Tunneling: No Undermining: No Wound Description Classification: Category/Stage III Exudate Amount: Medium Exudate Type: Serosanguineous Exudate Color: red, brown Foul Odor After Cleansing: No Slough/Fibrino No Wound Bed Granulation Amount: Large (67-100%) Exposed Structure Granulation Quality: Red, Pink Fascia Exposed: No Necrotic Amount: None Present (0%) Fat Layer (Subcutaneous Tissue) Exposed: Yes Tendon Exposed: No Muscle Exposed: No Joint Exposed: No Bone Exposed: No Treatment Notes Wound #6 (Gluteus) Wound Laterality: Right Cleanser Peri-Wound Care Skin Prep Discharge Instruction: Use skin prep as directed Amy Moses, Amy Moses ([id number]) Desitin Maximum Strength Ointment 4 (oz) Topical Primary Dressing Silvercel Small 2x2 (in/in) Discharge Instruction: Apply Silvercel Small 2x2 (in/in) as instructed Secondary Dressing Mepilex Border Flex, 4x4 (in/in) Discharge Instruction: Apply to wound as directed. Do not cut. Secured With Compression Wrap Compression Stockings Environmental education officer) Signed: 04/05/2021 8:20:21 PM By: Carlene Coria RN Entered By: Carlene Coria on  04/05/2021 12:40:40 Amy Moses ([id number]) -------------------------------------------------------------------------------- Wound Assessment Details Patient Name: Amy Moses Date of Service: 04/05/2021 10:45 AM Medical  Record Number: [id number] Patient Account Number: [id number] Date of Birth/Sex: [birth year] (49 y.o. F) Treating RN: Carlene Coria Primary Care Sharonda Llamas: Zenon Mayo Other Clinician: Referring Karel Mowers: Zenon Mayo Treating Rahman Ferrall/Extender: Skipper Cliche in Treatment: 17 Wound Status Wound Number: 8 Primary Pressure Ulcer Etiology: Wound Location: Left Gluteus Wound Open Wounding Event: Gradually Appeared Status: Date Acquired: 12/22/2020 Comorbid Cataracts, Glaucoma, Anemia, Hypertension, Type II Weeks Of Treatment: 11 History: Diabetes, End Stage Renal Disease, Neuropathy Clustered Wound: No Photos Wound Measurements Length: (cm) 4.5 % Reducti Width: (cm) 3 % Reducti Depth: (cm) 0.1 Epithelia Area: (cm) 10.603 Tunnelin Volume: (cm) 1.06 Undermin on in Area: 25% on in Volume: 25% lization: Large (67-100%) g: No ing: No Wound Description Classification: Category/Stage III Foul Odo Exudate Amount: Medium Slough/F Exudate Type: Serosanguineous Exudate Color: red, brown r After Cleansing: No ibrino Yes Wound Bed Granulation Amount: Medium (34-66%) Exposed Structure Granulation Quality: Red Fascia Exposed: No Necrotic Amount: Medium (34-66%) Fat Layer (Subcutaneous Tissue) Exposed: Yes Necrotic Quality: Adherent Slough Tendon Exposed: No Muscle Exposed: No Joint Exposed: No Bone Exposed: No Treatment Notes Wound #8 (Gluteus) Wound Laterality: Left Cleanser Peri-Wound Care Topical Primary Dressing Amy Moses, Amy Moses ([id number]) Silvercel Small 2x2 (in/in) Discharge Instruction: Apply Silvercel Small 2x2 (in/in) as instructed Secondary Dressing Mepilex Border Flex, 4x4 (in/in) Discharge Instruction: Apply to wound as  directed. Do not cut. Secured With Compression Wrap Compression Stockings Add-Ons Electronic Signature(s) Signed: 04/05/2021 8:20:21 PM By: Carlene Coria RN Entered By: Carlene Coria on 04/05/2021 12:41:45 Amy Moses ([id number]) -------------------------------------------------------------------------------- Wound Assessment Details Patient Name: Amy Moses Date of Service: 04/05/2021 10:45 AM Medical Record Number: [id number] Patient Account Number: [id number] Date of Birth/Sex: 27-Jun-1971 (50 y.o. F) Treating RN: Carlene Coria Primary Care Emonee Winkowski: Zenon Mayo Other Clinician: Referring Taran Haynesworth: Zenon Mayo Treating Denelda Akerley/Extender: Skipper Cliche in Treatment: 17 Wound Status Wound Number: 9 Primary Calciphylaxis Etiology: Wound Location: Left, Lateral Calf Wound Open Wounding Event: Gradually Appeared Status: Date Acquired: 02/16/2021 Comorbid Cataracts, Glaucoma, Anemia, Hypertension, Type II Weeks Of Treatment: 6 History: Diabetes, End Stage Renal Disease, Neuropathy Clustered Wound: No Photos Wound Measurements Length: (cm) 6 Width: (cm) 3 Depth: (cm) 0.1 Area: (cm) 14.137 Volume: (cm) 1.414 % Reduction in Area: -104.5% % Reduction in Volume: -104.6% Epithelialization: None Tunneling: No Undermining: No Wound Description Classification: Full Thickness Without Exposed Support Structure Exudate Amount: None Present s Foul Odor After Cleansing: No Slough/Fibrino Yes Wound Bed Granulation Amount: Small (1-33%) Exposed Structure Granulation Quality: Pink Fascia Exposed: No Necrotic Amount: Large (67-100%) Fat Layer (Subcutaneous Tissue) Exposed: Yes Necrotic Quality: Eschar, Adherent Slough Tendon Exposed: No Muscle Exposed: No Joint Exposed: No Bone Exposed: No Treatment Notes Wound #9 (Calf) Wound Laterality: Left, Lateral Cleanser Peri-Wound Care Topical Betadine Primary Dressing Amy Moses, Amy Moses ([id number]) Secondary  Dressing Secured With Compression Wrap Compression Stockings Add-Ons Electronic Signature(s) Signed: 04/05/2021 8:20:21 PM By: Carlene Coria RN Entered By: Carlene Coria on 04/05/2021 12:36:56 Amy Moses ([id number]) -------------------------------------------------------------------------------- Vitals Details Patient Name: Amy Moses Date of Service: 04/05/2021 10:45 AM Medical Record Number: [id number] Patient Account Number: [id number] Date of Birth/Sex: [birth year] (50 y.o. F) Treating RN: Carlene Coria Primary Care Bobbie Virden: Zenon Mayo Other Clinician: Referring Davieon Stockham: Zenon Mayo Treating Sophee Mckimmy/Extender: Skipper Cliche in Treatment: 17 Vital Signs Time Taken: 11:10 Temperature (F): 97.1 Height (in): 66 Pulse (bpm): 72 Weight (lbs): 235 Respiratory Rate (breaths/min): 18 Body Mass Index (BMI): 37.9 Blood Pressure (mmHg): 114/69 Reference Range: 80 - 120 mg / dl Electronic Signature(s) Signed: 04/05/2021 8:20:21 PM By: Carlene Coria RN  Entered By: Carlene Coria on 04/05/2021 11:12:40

## 2021-04-22 ENCOUNTER — Inpatient Hospital Stay (HOSPITAL_COMMUNITY)
Admission: EM | Admit: 2021-04-22 | Discharge: 2021-04-25 | DRG: 393 | Disposition: A | Discharge status: Home Health | Payer: Medicaid - Out of State | Attending: Family Medicine | Admitting: Family Medicine

## 2021-04-22 ENCOUNTER — Other Ambulatory Visit: Payer: Self-pay

## 2021-04-22 ENCOUNTER — Encounter (HOSPITAL_COMMUNITY): Payer: Self-pay | Admitting: *Deleted

## 2021-04-22 DIAGNOSIS — Z841 Family history of disorders of kidney and ureter: Secondary | ICD-10-CM

## 2021-04-22 DIAGNOSIS — Z888 Allergy status to other drugs, medicaments and biological substances status: Secondary | ICD-10-CM

## 2021-04-22 DIAGNOSIS — S91309A Unspecified open wound, unspecified foot, initial encounter: Secondary | ICD-10-CM | POA: Diagnosis present

## 2021-04-22 DIAGNOSIS — E785 Hyperlipidemia, unspecified: Secondary | ICD-10-CM | POA: Diagnosis present

## 2021-04-22 DIAGNOSIS — Z833 Family history of diabetes mellitus: Secondary | ICD-10-CM

## 2021-04-22 DIAGNOSIS — I120 Hypertensive chronic kidney disease with stage 5 chronic kidney disease or end stage renal disease: Secondary | ICD-10-CM | POA: Diagnosis present

## 2021-04-22 DIAGNOSIS — E039 Hypothyroidism, unspecified: Secondary | ICD-10-CM | POA: Diagnosis present

## 2021-04-22 DIAGNOSIS — E782 Mixed hyperlipidemia: Secondary | ICD-10-CM | POA: Diagnosis present

## 2021-04-22 DIAGNOSIS — E8889 Other specified metabolic disorders: Secondary | ICD-10-CM | POA: Diagnosis present

## 2021-04-22 DIAGNOSIS — S91301D Unspecified open wound, right foot, subsequent encounter: Secondary | ICD-10-CM

## 2021-04-22 DIAGNOSIS — L89612 Pressure ulcer of right heel, stage 2: Secondary | ICD-10-CM | POA: Diagnosis present

## 2021-04-22 DIAGNOSIS — K269 Duodenal ulcer, unspecified as acute or chronic, without hemorrhage or perforation: Secondary | ICD-10-CM | POA: Diagnosis present

## 2021-04-22 DIAGNOSIS — E1165 Type 2 diabetes mellitus with hyperglycemia: Secondary | ICD-10-CM | POA: Diagnosis present

## 2021-04-22 DIAGNOSIS — K635 Polyp of colon: Principal | ICD-10-CM | POA: Diagnosis present

## 2021-04-22 DIAGNOSIS — H5461 Unqualified visual loss, right eye, normal vision left eye: Secondary | ICD-10-CM | POA: Diagnosis present

## 2021-04-22 DIAGNOSIS — Z20822 Contact with and (suspected) exposure to covid-19: Secondary | ICD-10-CM | POA: Diagnosis present

## 2021-04-22 DIAGNOSIS — H409 Unspecified glaucoma: Secondary | ICD-10-CM | POA: Diagnosis present

## 2021-04-22 DIAGNOSIS — IMO0002 Reserved for concepts with insufficient information to code with codable children: Secondary | ICD-10-CM

## 2021-04-22 DIAGNOSIS — D62 Acute posthemorrhagic anemia: Secondary | ICD-10-CM

## 2021-04-22 DIAGNOSIS — K922 Gastrointestinal hemorrhage, unspecified: Secondary | ICD-10-CM

## 2021-04-22 DIAGNOSIS — K644 Residual hemorrhoidal skin tags: Secondary | ICD-10-CM | POA: Diagnosis present

## 2021-04-22 DIAGNOSIS — Z79890 Hormone replacement therapy (postmenopausal): Secondary | ICD-10-CM

## 2021-04-22 DIAGNOSIS — Z794 Long term (current) use of insulin: Secondary | ICD-10-CM

## 2021-04-22 DIAGNOSIS — Z7982 Long term (current) use of aspirin: Secondary | ICD-10-CM

## 2021-04-22 DIAGNOSIS — E1122 Type 2 diabetes mellitus with diabetic chronic kidney disease: Secondary | ICD-10-CM | POA: Diagnosis present

## 2021-04-22 DIAGNOSIS — E118 Type 2 diabetes mellitus with unspecified complications: Secondary | ICD-10-CM

## 2021-04-22 DIAGNOSIS — I959 Hypotension, unspecified: Secondary | ICD-10-CM | POA: Diagnosis present

## 2021-04-22 DIAGNOSIS — Z8249 Family history of ischemic heart disease and other diseases of the circulatory system: Secondary | ICD-10-CM

## 2021-04-22 DIAGNOSIS — N186 End stage renal disease: Secondary | ICD-10-CM | POA: Diagnosis present

## 2021-04-22 DIAGNOSIS — E119 Type 2 diabetes mellitus without complications: Secondary | ICD-10-CM

## 2021-04-22 DIAGNOSIS — Z79899 Other long term (current) drug therapy: Secondary | ICD-10-CM

## 2021-04-22 DIAGNOSIS — Z993 Dependence on wheelchair: Secondary | ICD-10-CM

## 2021-04-22 DIAGNOSIS — K2970 Gastritis, unspecified, without bleeding: Secondary | ICD-10-CM | POA: Diagnosis present

## 2021-04-22 DIAGNOSIS — Z992 Dependence on renal dialysis: Secondary | ICD-10-CM | POA: Diagnosis present

## 2021-04-22 LAB — PROTIME-INR
INR: 1.1 (ref 0.8–1.2)
Prothrombin Time: 13.7 seconds (ref 11.4–15.2)

## 2021-04-22 LAB — CBC WITH DIFFERENTIAL/PLATELET
Abs Immature Granulocytes: 0.03 10*3/uL (ref 0.00–0.07)
Basophils Absolute: 0 10*3/uL (ref 0.0–0.1)
Basophils Relative: 0 %
Eosinophils Absolute: 0.3 10*3/uL (ref 0.0–0.5)
Eosinophils Relative: 4 %
HCT: 23.6 % — ABNORMAL LOW (ref 36.0–46.0)
Hemoglobin: 6.9 g/dL — CL (ref 12.0–15.0)
Immature Granulocytes: 0 %
Lymphocytes Relative: 18 %
Lymphs Abs: 1.2 10*3/uL (ref 0.7–4.0)
MCH: 27 pg (ref 26.0–34.0)
MCHC: 29.2 g/dL — ABNORMAL LOW (ref 30.0–36.0)
MCV: 92.2 fL (ref 80.0–100.0)
Monocytes Absolute: 0.8 10*3/uL (ref 0.1–1.0)
Monocytes Relative: 11 %
Neutro Abs: 4.5 10*3/uL (ref 1.7–7.7)
Neutrophils Relative %: 67 %
Platelets: 221 10*3/uL (ref 150–400)
RBC: 2.56 MIL/uL — ABNORMAL LOW (ref 3.87–5.11)
RDW: 17.2 % — ABNORMAL HIGH (ref 11.5–15.5)
WBC: 6.8 10*3/uL (ref 4.0–10.5)
nRBC: 0 % (ref 0.0–0.2)

## 2021-04-22 LAB — COMPREHENSIVE METABOLIC PANEL
ALT: 13 U/L (ref 0–44)
AST: 11 U/L — ABNORMAL LOW (ref 15–41)
Albumin: 3.1 g/dL — ABNORMAL LOW (ref 3.5–5.0)
Alkaline Phosphatase: 146 U/L — ABNORMAL HIGH (ref 38–126)
Anion gap: 17 — ABNORMAL HIGH (ref 5–15)
BUN: 43 mg/dL — ABNORMAL HIGH (ref 6–20)
CO2: 25 mmol/L (ref 22–32)
Calcium: 9.5 mg/dL (ref 8.9–10.3)
Chloride: 96 mmol/L — ABNORMAL LOW (ref 98–111)
Creatinine, Ser: 4.57 mg/dL — ABNORMAL HIGH (ref 0.44–1.00)
GFR, Estimated: 11 mL/min — ABNORMAL LOW (ref >60)
Glucose, Bld: 230 mg/dL — ABNORMAL HIGH (ref 70–99)
Potassium: 5 mmol/L (ref 3.5–5.1)
Sodium: 138 mmol/L (ref 135–145)
Total Bilirubin: 0.6 mg/dL (ref 0.3–1.2)
Total Protein: 6.6 g/dL (ref 6.5–8.1)

## 2021-04-22 LAB — POC OCCULT BLOOD, ED: Fecal Occult Bld: POSITIVE — AB

## 2021-04-22 LAB — PREPARE RBC (CROSSMATCH)

## 2021-04-22 MED ORDER — PANTOPRAZOLE SODIUM 40 MG IV SOLR
40.0000 mg | Freq: Once | INTRAVENOUS | Status: AC
  Administered 2021-04-22: 40 mg via INTRAVENOUS
  Filled 2021-04-22: qty 40

## 2021-04-22 MED ORDER — SODIUM CHLORIDE 0.9 % IV SOLN
10.0000 mL/h | Freq: Once | INTRAVENOUS | Status: AC
  Administered 2021-04-22: 10 mL/h via INTRAVENOUS

## 2021-04-22 NOTE — ED Notes (Signed)
Date and time results received: 04/22/21 2236  Test: Hgb Critical Value: 6.9  Name of Provider Notified: Roderic Palau, MD  Orders Received? Or Actions Taken?: acknowledged

## 2021-04-22 NOTE — ED Provider Notes (Signed)
[facility] EMERGENCY DEPARTMENT Provider Note   CSN: [id number] Arrival date & time: 04/22/21  1813     History Chief Complaint  Patient presents with  . Blood In Stools    Amy Moses is a 50 y.o. female.  Patient states that she has been having some dark stools for the last 10 days..  She has never had any rectal bleeding before and she has never had a colonoscopy done before.  Patient is also a dialysis patient and she was at dialysis today and her dialysis shunt was infiltrated so she did not get dialysis today she was told to come back tomorrow.  The history is provided by the patient and medical records. No language interpreter was used.  Weakness Severity:  Mild Onset quality:  Gradual Timing:  Intermittent Progression:  Waxing and waning Chronicity:  New Context: not alcohol use   Relieved by:  Nothing Associated symptoms: no abdominal pain, no chest pain, no cough, no diarrhea, no frequency, no headaches and no seizures        Past Medical History:  Diagnosis Date  . Anemia   . 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 Diabetes mellitus, type II ([place])   . Dyslipidemia   . Glaucoma   . Hypertension   . Hypothyroidism (acquired)   . Kidney disease    Stage 5  . Pneumonia     Patient Active Problem List   Diagnosis Date Noted  . Fever 03/31/2021  . Acute on chronic heart failure with preserved ejection fraction (HFpEF) ([place]) 03/30/2021  . Pressure injury of skin 03/30/2021  . End stage renal disease ([place]) 11/16/2020  . Calciphylaxis 11/06/2020  . Non-healing open wound of heel 11/03/2020  . Diabetic foot infection ([place]) 11/01/2020  . Decubitus ulcer, heel 11/01/2020  . Closed nondisplaced fracture of left patella 10/29/2020  . Metabolic acidosis [id number]  . Acute on chronic renal failure ([place]) 06/10/2020  . Symptomatic anemia 06/10/2020  . Acute pericardial effusion 06/10/2020  . Other acute  nonsuppurative otitis media, unspecified ear 11/29/2019  . Chronic kidney disease, stage 4 (severe) ([place]) 03/05/2019  . Skin ulcer, limited to breakdown of skin ([place]) 01/28/2019  . Vitamin D deficiency 01/28/2019  . Uncontrolled type 2 diabetes mellitus with chronic kidney disease, with long-term current use of insulin ([place]) 09/21/2015  . Hyperlipidemia 09/21/2015  . Essential hypertension, benign 09/21/2015  . Primary hypothyroidism 09/21/2015  . Iris bomb 07/31/2012  . Secondary angle-closure glaucoma 07/31/2012    Past Surgical History:  Procedure Laterality Date  . ABDOMINAL AORTOGRAM W/LOWER EXTREMITY Bilateral 12/18/2020   Procedure: ABDOMINAL AORTOGRAM W/LOWER EXTREMITY;  Surgeon: Elam Dutch, MD;  Location: [place] [facility];  Service: Cardiovascular;  Laterality: Bilateral;  . ANKLE FRACTURE SURGERY    . AV FISTULA PLACEMENT Left 08/18/2020   Procedure: LEFT ARM BRACHIOCEPHALIC ARTERIOVENOUS (AV) FISTULA CREATION;  Surgeon: Elam Dutch, MD;  Location: [place];  Service: Vascular;  Laterality: Left;  . CESAREAN SECTION    . CHOLECYSTECTOMY    . EYE SURGERY     Vatrectomy  . IR PERC TUN PERIT CATH WO PORT S&I Dartha Lodge  09/15/2020  . IR REMOVAL TUN CV CATH W/O FL  02/19/2021  . IR US GUIDE VASC ACCESS RIGHT  09/15/2020  . TOE SURGERY       OB History   No obstetric history on file.     Family History  Problem Relation Age  of Onset  . Heart disease Mother   . Diabetes Mother   . Kidney disease Mother   . Diabetes Father   . Heart disease Father   . Diabetes Brother     Social History   Tobacco Use  . Smoking status: Never Smoker  . Smokeless tobacco: Never Used  Vaping Use  . Vaping Use: Never used  Substance Use Topics  . Alcohol use: No  . Drug use: No    Home Medications Prior to Admission medications   Medication Sig Start Date End Date Taking? Authorizing Provider  acetaminophen (TYLENOL) 500 MG tablet Take 1,000 mg by mouth every 6 (six)  hours as needed for moderate pain.    [provider]  aspirin 325 MG EC tablet Take 325 mg by mouth daily.    [provider]  atorvastatin (LIPITOR) 10 MG tablet Take 10 mg by mouth daily. 05/14/20   [provider]  epoetin alfa (EPOGEN) 4000 UNIT/ML injection Inject 4,000 Units into the skin every 14 (fourteen) days.  06/19/20   [provider]  HUMALOG KWIKPEN 100 UNIT/ML KwikPen Inject 5-15 Units into the skin 3 (three) times daily with meals. 03/03/20   [provider]  insulin glargine (LANTUS) 100 UNIT/ML injection Inject 50 Units into the skin at bedtime.    [provider]  levothyroxine (SYNTHROID) 50 MCG tablet Take 50 mcg by mouth daily before breakfast.    [provider]  metoprolol succinate (TOPROL-XL) 50 MG 24 hr tablet Take 50 mg by mouth at bedtime. Take with or immediately following a meal.    [provider]  sevelamer carbonate (RENVELA) 800 MG tablet Take 800 mg by mouth 3 (three) times daily with meals. 10/14/20   [provider]  sodium bicarbonate 650 MG tablet Take 2 tablets (1,300 mg total) by mouth 3 (three) times daily. 04/01/21   Debbe Odea, MD  torsemide (DEMADEX) 100 MG tablet Take 150 mg by mouth daily.  05/22/20   [provider]    Allergies    Ace inhibitors  Review of Systems   Review of Systems  Constitutional: Negative for appetite change and fatigue.  HENT: Negative for congestion, ear discharge and sinus pressure.   Eyes: Negative for discharge.  Respiratory: Negative for cough.   Cardiovascular: Negative for chest pain.  Gastrointestinal: Negative for abdominal pain and diarrhea.       Dark stools.  Genitourinary: Negative for frequency and hematuria.  Musculoskeletal: Negative for back pain.  Skin: Negative for rash.  Neurological: Positive for weakness. Negative for seizures and headaches.  Psychiatric/Behavioral: Negative for hallucinations.     Physical Exam Updated Vital Signs BP 101/61   Pulse 69   Temp 98 F (36.7 C) (Oral)   Resp 16   Ht '5\' 6"'$  (1.676 m)   LMP 08/22/2015 (Approximate)   SpO2 97%   BMI 28.29 kg/m   Physical Exam Vitals and nursing note reviewed.  Constitutional:      Appearance: She is well-developed.  HENT:     Head: Normocephalic.     Nose: Nose normal.  Eyes:     General: No scleral icterus.    Conjunctiva/sclera: Conjunctivae normal.  Neck:     Thyroid: No thyromegaly.  Cardiovascular:     Rate and Rhythm: Normal rate and regular rhythm.     Heart sounds: No murmur heard. No friction rub. No gallop.   Pulmonary:     Breath sounds: No stridor. No wheezing or  rales.  Chest:     Chest wall: No tenderness.  Abdominal:     General: There is no distension.     Tenderness: There is no abdominal tenderness. There is no rebound.  Genitourinary:    Comments: Rectal exam there was a composite Musculoskeletal:        General: Normal range of motion.     Cervical back: Neck supple.  Lymphadenopathy:     Cervical: No cervical adenopathy.  Skin:    Findings: No erythema or rash.  Neurological:     Mental Status: She is alert and oriented to person, place, and time.     Motor: No abnormal muscle tone.     Coordination: Coordination normal.  Psychiatric:        Behavior: Behavior normal.     ED Results / Procedures / Treatments   Labs (all labs ordered are listed, but only abnormal results are displayed) Labs Reviewed  COMPREHENSIVE METABOLIC PANEL - Abnormal; Notable for the following components:      Result Value   Chloride 96 (*)    Glucose, Bld 230 (*)    BUN 43 (*)    Creatinine, Ser 4.57 (*)    Albumin 3.1 (*)    AST 11 (*)    Alkaline Phosphatase 146 (*)    GFR, Estimated 11 (*)    Anion gap 17 (*)    All other components within normal limits  CBC WITH DIFFERENTIAL/PLATELET - Abnormal; Notable for the following components:   RBC 2.56 (*)    Hemoglobin 6.9 (*)    HCT  23.6 (*)    MCHC 29.2 (*)    RDW 17.2 (*)    All other components within normal limits  PROTIME-INR  POC OCCULT BLOOD, ED  TYPE AND SCREEN  PREPARE RBC (CROSSMATCH)    EKG None  Radiology No results found.  Procedures Procedures   Medications Ordered in ED Medications  pantoprazole (PROTONIX) injection 40 mg (40 mg Intravenous Given 04/22/21 2330)  0.9 %  sodium chloride infusion (10 mL/hr Intravenous New Bag/Given 04/22/21 2329)    ED Course  I have reviewed the triage vital signs and the nursing notes.  Pertinent labs & imaging results that were available during my care of the patient were reviewed by me and considered in my medical decision making (see chart for details). CRITICAL CARE Performed by: Milton Ferguson Total critical care time: 40 minutes Critical care time was exclusive of separately billable procedures and treating other patients. Critical care was necessary to treat or prevent imminent or life-threatening deterioration. Critical care was time spent personally by me on the following activities: development of treatment plan with patient and/or surrogate as well as nursing, discussions with consultants, evaluation of patient's response to treatment, examination of patient, obtaining history from patient or surrogate, ordering and performing treatments and interventions, ordering and review of laboratory studies, ordering and review of radiographic studies, pulse oximetry and re-evaluation of patient's condition.   GI has been consulted but we are were unable to get in touch with him tonight.  I told the hospitalist this and he will make sure GI see the patient tomorrow MDM Rules/Calculators/A&P                          Patient with GI bleed.  She is given protonic and will be transfused 1 unit of blood.  GI will see the patient tomorrow along with nephrology Final Clinical Impression(s) /  ED Diagnoses Final diagnoses:  Gastrointestinal hemorrhage, unspecified  gastrointestinal hemorrhage type    Rx / DC Orders ED Discharge Orders    None       Milton Ferguson, MD 04/22/21 2347

## 2021-04-22 NOTE — ED Provider Notes (Signed)
Emergency Medicine Provider Triage Evaluation Note  Amy Moses , a 50 y.o. female  was evaluated in triage.  Pt complains of bloody stools for about 7-10 days. Reports sxs are intermittent. Denies bright red blood or melena. Denies abd pain, syncope or lightheadedness. She is not anticoagulated. States that she   Review of Systems  Positive: Bloody stools Negative: abd pain, lightheadedness, syncope  Physical Exam  BP 134/78 (BP Location: Right Arm)   Pulse 69   Temp 98 F (36.7 C) (Oral)   Resp 20   Ht '5\' 6"'$  (1.676 m)   LMP 08/22/2015 (Approximate)   SpO2 100%   BMI 28.29 kg/m  Gen:   Awake, no distress   Resp:  Normal effort  MSK:   Moves extremities without difficulty  Other:  Pale conjunctiva  Medical Decision Making  Medically screening exam initiated at 8:16 PM.  Appropriate orders placed.  [name] was informed that the remainder of the evaluation will be completed by another provider, this initial triage assessment does not replace that evaluation, and the importance of remaining in the ED until their evaluation is complete.     Bishop Dublin 04/22/21 2050    Milton Ferguson, MD 04/23/21 2244

## 2021-04-22 NOTE — H&P (Addendum)
History and Physical  Amy Moses [id number] DOB: Nov 10, 1971 DOA: 04/22/2021  Referring physician: Milton Ferguson, MD PCP: Leeanne Rio, MD  Patient coming from: Home  Chief Complaint: Blood in stool  HPI: Amy Moses is a 50 y.o. female with medical history significant for ESRD on HD (TTS), T2DM, calciphylaxis with wound VAC, right eye blindness, hypothyroidism, hypertension, hyperlipidemia who presents to the emergency department for evaluation of blood in stool.  Patient states that she has noted intermittent black stool for about 1 to 2 weeks, she was concerned, so she called her PCP and she was asked to go to the ED for further evaluation.  Patient states that she went for dialysis today, but her AV fistula was infiltrated, so she was unable to have dialysis.  She ambulates with wheelchair due to right heel ulcer and bilateral thigh wounds due to calciphylaxis with wound VAC.  She denies fever, chills, chest pain, shortness of breath or abdominal pain.  ED Course:  In the emergency department, she was hemodynamically stable, though BP was soft at 99/61.  Work-up in the ED showed H/H 6.9/23.6 (this was 10.2/32.8 on 04/01/2021), BUN/creatinine 43/1.57, ALP 146.  FOBT was positive. Type and screen was done and 1 unit of PRBC was negative for transfusion in the ED per ED physician.  GI was consulted and will see patient in the morning per ED physician.  Hospitalist was asked to admit patient for further evaluation and management.  Review of Systems: Constitutional: Negative for chills and fever.  HENT: Negative for ear pain and sore throat.   Eyes: Negative for pain and visual disturbance.  Respiratory: Negative for cough, chest tightness and shortness of breath.   Cardiovascular: Negative for chest pain and palpitations.  Gastrointestinal: Positive for black stool.  Negative for abdominal pain and vomiting.  Endocrine: Negative for polyphagia and polyuria.  Genitourinary:  Negative for decreased urine volume, dysuria, enuresis Musculoskeletal: Positive for bilateral thigh wound with wound VAC and right heel ulcer. Skin: Negative for color change and rash.  Allergic/Immunologic: Negative for immunocompromised state.  Neurological: Positive for weakness.  Negative for tremors, syncope, speech difficulty Hematological: Does not bruise/bleed easily.  All other systems reviewed and are negative  Past Medical History:  Diagnosis Date  . Anemia   . 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 Diabetes mellitus, type II ([place])   . Dyslipidemia   . Glaucoma   . Hypertension   . Hypothyroidism (acquired)   . Kidney disease    Stage 5  . Pneumonia    Past Surgical History:  Procedure Laterality Date  . ABDOMINAL AORTOGRAM W/LOWER EXTREMITY Bilateral 12/18/2020   Procedure: ABDOMINAL AORTOGRAM W/LOWER EXTREMITY;  Surgeon: Elam Dutch, MD;  Location: [place] [facility];  Service: Cardiovascular;  Laterality: Bilateral;  . ANKLE FRACTURE SURGERY    . AV FISTULA PLACEMENT Left 08/18/2020   Procedure: LEFT ARM BRACHIOCEPHALIC ARTERIOVENOUS (AV) FISTULA CREATION;  Surgeon: Elam Dutch, MD;  Location: [place];  Service: Vascular;  Laterality: Left;  . CESAREAN SECTION    . CHOLECYSTECTOMY    . EYE SURGERY     Vatrectomy  . IR PERC TUN PERIT CATH WO PORT S&I Dartha Lodge  09/15/2020  . IR REMOVAL TUN CV CATH W/O FL  02/19/2021  . IR US GUIDE VASC ACCESS RIGHT  09/15/2020  . TOE SURGERY      Social History:  reports that she has never smoked. She  has never used smokeless tobacco. She reports that she does not drink alcohol and does not use drugs.   Allergies  Allergen Reactions  . Ace Inhibitors Cough    Family History  Problem Relation Age of Onset  . Heart disease Mother   . Diabetes Mother   . Kidney disease Mother   . Diabetes Father   . Heart disease Father   . Diabetes Brother      Prior to Admission  medications   Medication Sig Start Date End Date Taking? Authorizing Provider  acetaminophen (TYLENOL) 500 MG tablet Take 1,000 mg by mouth every 6 (six) hours as needed for moderate pain.    [provider]  aspirin 325 MG EC tablet Take 325 mg by mouth daily.    [provider]  atorvastatin (LIPITOR) 10 MG tablet Take 10 mg by mouth daily. 05/14/20   [provider]  epoetin alfa (EPOGEN) 4000 UNIT/ML injection Inject 4,000 Units into the skin every 14 (fourteen) days.  06/19/20   [provider]  HUMALOG KWIKPEN 100 UNIT/ML KwikPen Inject 5-15 Units into the skin 3 (three) times daily with meals. 03/03/20   [provider]  insulin glargine (LANTUS) 100 UNIT/ML injection Inject 50 Units into the skin at bedtime.    [provider]  levothyroxine (SYNTHROID) 50 MCG tablet Take 50 mcg by mouth daily before breakfast.    [provider]  metoprolol succinate (TOPROL-XL) 50 MG 24 hr tablet Take 50 mg by mouth at bedtime. Take with or immediately following a meal.    [provider]  sevelamer carbonate (RENVELA) 800 MG tablet Take 800 mg by mouth 3 (three) times daily with meals. 10/14/20   [provider]  sodium bicarbonate 650 MG tablet Take 2 tablets (1,300 mg total) by mouth 3 (three) times daily. 04/01/21   Debbe Odea, MD  torsemide (DEMADEX) 100 MG tablet Take 150 mg by mouth daily.  05/22/20   [provider]    Physical Exam: BP (!) 101/55 (BP Location: Right Arm)   Pulse 69   Temp 97.9 F (36.6 C) (Oral)   Resp 16   Ht '5\' 6"'$  (1.676 m)   LMP 08/22/2015 (Approximate)   SpO2 100%   BMI 28.29 kg/m   . General: 50 y.o. year-old female well developed well nourished in no acute distress.  Alert and oriented x3. Marland Kitchen HEENT: NCAT, EOMI . Neck: Supple, trachea medial . Cardiovascular: Regular rate and rhythm with no rubs or gallops.  No thyromegaly or JVD noted.  No lower extremity edema. 2/4 pulses in  all 4 extremities. Marland Kitchen Respiratory: Clear to auscultation with no wheezes or rales. Good inspiratory effort. . Abdomen: Soft, nontender nondistended with normal bowel sounds x4 quadrants. . Muskuloskeletal: Bilateral wounds on thighs wound VAC, bandaged right heel wound noted . Neuro: CN II-XII intact, strength 5/5 x 4, sensation, reflexes intact . Skin: No ulcerative lesions noted or rashes . Psychiatry: Mood is appropriate for condition and setting          Labs on Admission:  Basic Metabolic Panel: Recent Labs  Lab 04/22/21 2134  NA 138  K 5.0  CL 96*  CO2 25  GLUCOSE 230*  BUN 43*  CREATININE 4.57*  CALCIUM 9.5   Liver Function Tests: Recent Labs  Lab 04/22/21 2134  AST 11*  ALT 13  ALKPHOS 146*  BILITOT 0.6  PROT 6.6  ALBUMIN 3.1*   No results for input(s): LIPASE, AMYLASE in the last  168 hours. No results for input(s): AMMONIA in the last 168 hours. CBC: Recent Labs  Lab 04/22/21 2134  WBC 6.8  NEUTROABS 4.5  HGB 6.9*  HCT 23.6*  MCV 92.2  PLT 221   Cardiac Enzymes: No results for input(s): CKTOTAL, CKMB, CKMBINDEX, TROPONINI in the last 168 hours.  BNP (last 3 results) Recent Labs    03/30/21 1658  BNP 1,068.9*    ProBNP (last 3 results) No results for input(s): PROBNP in the last 8760 hours.  CBG: No results for input(s): GLUCAP in the last 168 hours.  Radiological Exams on Admission: No results found.  EKG: I independently viewed the EKG done and my findings are as followed: EKG was not done in the ED  Assessment/Plan Present on Admission: . GI bleed . Calciphylaxis . Hyperlipidemia . Non-healing open wound of heel  Principal Problem:   GI bleed Active Problems:   Uncontrolled type 2 diabetes mellitus with chronic kidney disease, with long-term current use of insulin ([facility])   Hyperlipidemia   Non-healing open wound of heel   Calciphylaxis   Acute blood loss anemia  Acute GI bleed Acute blood loss anemia H/H 6.9/23.6 (this  was 10.2/32.8 on 04/01/2021) Hemoccult was positive Type and crossmatch was done with transfusion of 1 unit Patient will be kept n.p.o. at this time Continue IV Protonix 40 twice daily Gastroenterologist was consulted and will see patient in the morning per ED physician  ESRD on HD (TTS) Patient states that she was unable to have dialysis yesterday due to infiltration of AV fistula Nephrology will be consulted for maintenance HD  Hyperlipidemia Continue statin resumes oral intake  Calciphylaxis with wound VAC on bilateral thighs Continue wound VAC Continue wound care Continue PT/OT eval and treat  Pressure injury of right heel Continue wound care  Insulin-dependent T2DM Continue ISS and hypoglycemic protocol Continue Lantus 10 units nightly  Hypothyroidism Continue Synthroid when patient resumes oral intake   DVT prophylaxis: SCDs  Code Status: Full code  Family Communication: None at bedside  Disposition Plan:  Patient is from:                        home Anticipated DC to:                   SNF or family members home Anticipated DC date:               2-3 days Anticipated DC barriers:           Patient requires inpatient management due to GI bleed pending gastroenterology consult and pending nephrology consult for dialysis   Consults called: Gastroenterology, nephrology  Admission status: Observation    Bernadette Hoit MD [facility]  04/23/2021, 6:06 AM

## 2021-04-22 NOTE — ED Triage Notes (Signed)
Blood in stool intermittent for the past 10 days

## 2021-04-23 ENCOUNTER — Ambulatory Visit: Payer: Medicaid - Out of State | Admitting: Physician Assistant

## 2021-04-23 ENCOUNTER — Encounter (HOSPITAL_COMMUNITY): Payer: Self-pay | Admitting: Internal Medicine

## 2021-04-23 DIAGNOSIS — D62 Acute posthemorrhagic anemia: Secondary | ICD-10-CM

## 2021-04-23 DIAGNOSIS — N186 End stage renal disease: Secondary | ICD-10-CM | POA: Diagnosis not present

## 2021-04-23 DIAGNOSIS — K922 Gastrointestinal hemorrhage, unspecified: Secondary | ICD-10-CM | POA: Diagnosis not present

## 2021-04-23 LAB — CBC
HCT: 21.2 % — ABNORMAL LOW (ref 36.0–46.0)
Hemoglobin: 6.1 g/dL — CL (ref 12.0–15.0)
MCH: 26.8 pg (ref 26.0–34.0)
MCHC: 28.8 g/dL — ABNORMAL LOW (ref 30.0–36.0)
MCV: 93 fL (ref 80.0–100.0)
Platelets: 198 10*3/uL (ref 150–400)
RBC: 2.28 MIL/uL — ABNORMAL LOW (ref 3.87–5.11)
RDW: 17.2 % — ABNORMAL HIGH (ref 11.5–15.5)
WBC: 6.2 10*3/uL (ref 4.0–10.5)
nRBC: 0 % (ref 0.0–0.2)

## 2021-04-23 LAB — COMPREHENSIVE METABOLIC PANEL
ALT: 12 U/L (ref 0–44)
AST: 12 U/L — ABNORMAL LOW (ref 15–41)
Albumin: 2.8 g/dL — ABNORMAL LOW (ref 3.5–5.0)
Alkaline Phosphatase: 119 U/L (ref 38–126)
Anion gap: 18 — ABNORMAL HIGH (ref 5–15)
BUN: 46 mg/dL — ABNORMAL HIGH (ref 6–20)
CO2: 23 mmol/L (ref 22–32)
Calcium: 9.3 mg/dL (ref 8.9–10.3)
Chloride: 98 mmol/L (ref 98–111)
Creatinine, Ser: 4.86 mg/dL — ABNORMAL HIGH (ref 0.44–1.00)
GFR, Estimated: 10 mL/min — ABNORMAL LOW (ref >60)
Glucose, Bld: 120 mg/dL — ABNORMAL HIGH (ref 70–99)
Potassium: 4.7 mmol/L (ref 3.5–5.1)
Sodium: 139 mmol/L (ref 135–145)
Total Bilirubin: 0.5 mg/dL (ref 0.3–1.2)
Total Protein: 5.9 g/dL — ABNORMAL LOW (ref 6.5–8.1)

## 2021-04-23 LAB — GLUCOSE, CAPILLARY
Glucose-Capillary: 132 mg/dL — ABNORMAL HIGH (ref 70–99)
Glucose-Capillary: 98 mg/dL (ref 70–99)
Glucose-Capillary: 150 mg/dL — ABNORMAL HIGH (ref 70–99)
Glucose-Capillary: 138 mg/dL — ABNORMAL HIGH (ref 70–99)
Glucose-Capillary: 127 mg/dL — ABNORMAL HIGH (ref 70–99)

## 2021-04-23 LAB — HEMOGLOBIN AND HEMATOCRIT, BLOOD
HCT: 23.7 % — ABNORMAL LOW (ref 36.0–46.0)
Hemoglobin: 7.2 g/dL — ABNORMAL LOW (ref 12.0–15.0)

## 2021-04-23 LAB — PHOSPHORUS: Phosphorus: 7.2 mg/dL — ABNORMAL HIGH (ref 2.5–4.6)

## 2021-04-23 LAB — PREPARE RBC (CROSSMATCH)

## 2021-04-23 LAB — PROTIME-INR
INR: 1.2 (ref 0.8–1.2)
Prothrombin Time: 14.9 seconds (ref 11.4–15.2)

## 2021-04-23 LAB — SARS CORONAVIRUS 2 (TAT 6-24 HRS): SARS Coronavirus 2: NEGATIVE

## 2021-04-23 LAB — MAGNESIUM: Magnesium: 2.3 mg/dL (ref 1.7–2.4)

## 2021-04-23 LAB — APTT: aPTT: 34 seconds (ref 24–36)

## 2021-04-23 MED ORDER — BISACODYL 5 MG PO TBEC
10.0000 mg | DELAYED_RELEASE_TABLET | Freq: Once | ORAL | Status: AC
  Administered 2021-04-23: 10 mg via ORAL
  Filled 2021-04-23: qty 2

## 2021-04-23 MED ORDER — PENTAFLUOROPROP-TETRAFLUOROETH EX AERO: 1.0000 "application " | INHALATION_SPRAY | CUTANEOUS | Status: DC | PRN

## 2021-04-23 MED ORDER — CHLORHEXIDINE GLUCONATE CLOTH 2 % EX PADS
6.0000 | MEDICATED_PAD | Freq: Every day | CUTANEOUS | Status: DC
  Administered 2021-04-23 – 2021-04-25 (×3): 6 via TOPICAL

## 2021-04-23 MED ORDER — MIDODRINE HCL 5 MG PO TABS
10.0000 mg | ORAL_TABLET | ORAL | Status: AC | PRN
  Administered 2021-04-23: 10 mg via ORAL

## 2021-04-23 MED ORDER — INSULIN ASPART 100 UNIT/ML IJ SOLN
0.0000 [IU] | Freq: Three times a day (TID) | INTRAMUSCULAR | Status: DC
  Administered 2021-04-24: 1 [IU] via SUBCUTANEOUS
  Administered 2021-04-25: 2 [IU] via SUBCUTANEOUS

## 2021-04-23 MED ORDER — INSULIN ASPART 100 UNIT/ML IJ SOLN
0.0000 [IU] | Freq: Every day | INTRAMUSCULAR | Status: DC
  Administered 2021-04-24: 2 [IU] via SUBCUTANEOUS

## 2021-04-23 MED ORDER — SODIUM THIOSULFATE 250 MG/ML IV SOLN
25.0000 g | INTRAVENOUS | Status: DC
  Administered 2021-04-24: 25 g via INTRAVENOUS
  Filled 2021-04-23: qty 100

## 2021-04-23 MED ORDER — SODIUM CHLORIDE 0.9 % IV SOLN: 100.0000 mL | INTRAVENOUS | Status: DC | PRN

## 2021-04-23 MED ORDER — PANTOPRAZOLE SODIUM 40 MG IV SOLR
40.0000 mg | Freq: Two times a day (BID) | INTRAVENOUS | Status: DC
  Administered 2021-04-23 – 2021-04-25 (×5): 40 mg via INTRAVENOUS
  Filled 2021-04-23 (×5): qty 40

## 2021-04-23 MED ORDER — LEVOTHYROXINE SODIUM 50 MCG PO TABS
50.0000 ug | ORAL_TABLET | Freq: Every day | ORAL | Status: DC
  Administered 2021-04-25: 50 ug via ORAL
  Filled 2021-04-23: qty 1

## 2021-04-23 MED ORDER — LIDOCAINE-PRILOCAINE 2.5-2.5 % EX CREA: 1.0000 "application " | TOPICAL_CREAM | CUTANEOUS | Status: DC | PRN

## 2021-04-23 MED ORDER — INSULIN GLARGINE 100 UNIT/ML ~~LOC~~ SOLN
10.0000 [IU] | Freq: Every day | SUBCUTANEOUS | Status: DC
  Filled 2021-04-23 (×3): qty 0.1

## 2021-04-23 MED ORDER — SEVELAMER CARBONATE 800 MG PO TABS
800.0000 mg | ORAL_TABLET | Freq: Three times a day (TID) | ORAL | Status: DC
  Administered 2021-04-23 – 2021-04-25 (×5): 800 mg via ORAL
  Filled 2021-04-23 (×6): qty 1

## 2021-04-23 MED ORDER — DARBEPOETIN ALFA 100 MCG/0.5ML IJ SOSY
100.0000 ug | PREFILLED_SYRINGE | Freq: Once | INTRAMUSCULAR | Status: AC
  Administered 2021-04-24: 100 ug via INTRAVENOUS
  Filled 2021-04-23 (×2): qty 0.5

## 2021-04-23 MED ORDER — SODIUM CHLORIDE 0.9% IV SOLUTION: Freq: Once | INTRAVENOUS | Status: AC

## 2021-04-23 MED ORDER — PEG 3350-KCL-NABCB-NACL-NASULF 236 G PO SOLR
4000.0000 mL | Freq: Once | ORAL | Status: AC
  Administered 2021-04-23: 4000 mL via ORAL
  Filled 2021-04-23: qty 4000

## 2021-04-23 MED ORDER — LIDOCAINE HCL (PF) 1 % IJ SOLN: 5.0000 mL | INTRAMUSCULAR | Status: DC | PRN

## 2021-04-23 MED ORDER — INSULIN GLARGINE 100 UNIT/ML ~~LOC~~ SOLN
10.0000 [IU] | Freq: Every day | SUBCUTANEOUS | Status: DC
  Administered 2021-04-24: 10 [IU] via SUBCUTANEOUS
  Filled 2021-04-23 (×2): qty 0.1

## 2021-04-23 NOTE — Progress Notes (Signed)
Patient Demographics:    Amy Moses, is a 50 y.o. female, DOB - [birth year], [id number]  Admit date - 04/22/2021   Admitting Physician Bernadette Hoit, DO  Outpatient Primary MD for the patient is Leeanne Rio, MD  LOS - 0   Chief Complaint  Patient presents with  . Blood In Stools        Subjective:    Amy Moses today has no fevers, no further emesis,  No chest pain,   --Tolerating hemodialysis well  Assessment  & Plan :    Principal Problem:   GI bleed Active Problems:   Uncontrolled type 2 diabetes mellitus with chronic kidney disease, with long-term current use of insulin (HCC)   Hyperlipidemia   End stage renal disease (HCC)   Non-healing open wound of heel   Calciphylaxis   Acute blood loss anemia  Brief Summary:- 50 y.o. female with medical history significant for ESRD on HD (TTS), T2DM, calciphylaxis with wound VAC, right eye blindness, hypothyroidism, hypertension, hyperlipidemia admitted on 04/22/2021 with concerns for GI bleed and drop in hemoglobin  A/p 1)Acute on chronic anemia due to acute blood loss/Gi Bleed- Patient received 1 unit of PRBCs Hgb 7.2 from 6.1 -EPO agent per nephrology team -GI consult appreciated continue IV Protonix plan is for EGD and colonoscopy on 04/24/2021  2)ESRD--usually TTS, missed hemodialysis on 04/22/2021 -Had HD on 04/23/2021 -Plan is for another HD session on 04/24/2021, will transfuse PRBC with HD  3)Calciphylaxis with wound VAC on bilateral thighs----wound VAC changed  4)DM2-hold Lantus insulin tonight as patient will be prepping for EGD and colonoscopy Use Novolog/Humalog Sliding scale insulin with Accu-Cheks/Fingersticks as ordered  5)Hypothyroidism--- continue levothyroxine  Disposition/Need for in-Hospital Stay- patient unable to be discharged at this time due to -presumed GI bleed with symptomatic anemia requiring  transfusion of PRBC and endoluminal evaluation*   Disposition: The patient is from: Home              Anticipated d/c is to: Home              Anticipated d/c date is: 2 days              Patient currently is not medically stable to d/c. Barriers: Not Clinically Stable-   Code Status :  -  Code Status: Full Code   Family Communication:    NA (patient is alert, awake and coherent)   Consults  :  Gi/Nephrology  DVT Prophylaxis  :   - SCDs  SCDs Start: 04/23/21 0346    Lab Results  Component Value Date   PLT 198 04/23/2021    Inpatient Medications  Scheduled Meds: . Chlorhexidine Gluconate Cloth  6 each Topical Q0600  . Chlorhexidine Gluconate Cloth  6 each Topical Q0600  . insulin aspart  0-5 Units Subcutaneous QHS  . insulin aspart  0-6 Units Subcutaneous TID WC  . insulin glargine  10 Units Subcutaneous QHS  . pantoprazole (PROTONIX) IV  40 mg Intravenous Q12H  . sevelamer carbonate  800 mg Oral TID WC   Continuous Infusions: . sodium chloride    . sodium chloride    . [START ON 04/24/2021] sodium thiosulfate infusion for calciphylaxis     PRN Meds:.sodium chloride, sodium chloride, lidocaine (  PF), lidocaine-prilocaine, pentafluoroprop-tetrafluoroeth    Anti-infectives (From admission, onward)   None        Objective:   Vitals:   04/23/21 1700 04/23/21 1730 04/23/21 1800 04/23/21 1830  BP: 123/70 115/65 122/67 120/68  Pulse: 74 77 72 72  Resp:      Temp:      TempSrc:      SpO2:      Height:        Wt Readings from Last 3 Encounters:  04/01/21 79.5 kg  12/18/20 106.6 kg  11/19/20 104.3 kg     Intake/Output Summary (Last 24 hours) at 04/23/2021 1850 Last data filed at 04/23/2021 1420 Gross per 24 hour  Intake 1060 ml  Output 2 ml  Net 1058 ml     Physical Exam  Gen:- Awake Alert,  In no apparent distress  HEENT:- [place].AT, No sclera icterus Neck-Supple Neck,No JVD,.  Lungs-  CTAB , fair symmetrical air movement CV- S1, S2 normal, regular   Abd-  +ve B.Sounds, Abd Soft, No tenderness,    Extremity/Skin:- No  edema, pedal pulses present  Psych-affect is appropriate, oriented x3 Neuro-no new focal deficits, no tremors MSK-AV fistula with thrill and bruit, bilateral thigh wound vacs   Data Review:   Micro Results Recent Results (from the past 240 hour(s))  SARS CORONAVIRUS 2 (TAT 6-24 HRS) Nasopharyngeal Nasopharyngeal Swab     Status: None   Collection Time: 04/22/21 11:56 PM   Specimen: Nasopharyngeal Swab  Result Value Ref Range Status   SARS Coronavirus 2 NEGATIVE NEGATIVE Final    Comment: (NOTE) SARS-CoV-2 target nucleic acids are NOT DETECTED.  The SARS-CoV-2 RNA is generally detectable in upper and lower respiratory specimens during the acute phase of infection. Negative results do not preclude SARS-CoV-2 infection, do not rule out co-infections with other pathogens, and should not be used as the sole basis for treatment or other patient management decisions. Negative results must be combined with clinical observations, patient history, and epidemiological information. The expected result is Negative.  Fact Sheet for Patients: SugarRoll.be  Fact Sheet for Healthcare Providers: https://www.woods-mathews.com/  This test is not yet approved or cleared by the [place] FDA and  has been authorized for detection and/or diagnosis of SARS-CoV-2 by FDA under an Emergency Use Authorization (EUA). This EUA will remain  in effect (meaning this test can be used) for the duration of the COVID-19 declaration under Se ction 564(b)(1) of the Act, 21 U.S.C. section 360bbb-3(b)(1), unless the authorization is terminated or revoked sooner.  Performed at [facility], [place] [address] [place], [place] [address]     Radiology Reports ECHOCARDIOGRAM COMPLETE  Result Date: 04/01/2021    ECHOCARDIOGRAM REPORT   Patient Name:   Amy Moses Date of Exam: 04/01/2021  Medical Rec #:  [id number]       Height:       66.0 in Accession #:    [id number]      Weight:       182.5 lb Date of Birth:  07-19-1971      BSA:          1.924 m Patient Age:    3 years        BP:           125/70 mmHg Patient Gender: F               HR:           67 bpm. Exam Location:  Inpatient  Procedure: 2D Echo, Cardiac Doppler and Color Doppler Indications:    CHF  History:        Patient has prior history of Echocardiogram examinations, most                 recent 06/10/2020. Risk Factors:Hypertension and Diabetes.  Sonographer:    Cammy Brochure Referring Phys: [place]  1. Left ventricular ejection fraction, by estimation, is 55 to 60%. The left ventricle has normal function. The left ventricle has no regional wall motion abnormalities. Left ventricular diastolic parameters are consistent with Grade II diastolic dysfunction (pseudonormalization).  2. Right ventricular systolic function is normal. The right ventricular size is normal.  3. Left atrial size was mildly dilated.  4. A small pericardial effusion is present.  5. The mitral valve is normal in structure. No evidence of mitral valve regurgitation.  6. Tricuspid valve regurgitation is mild to moderate.  7. The aortic valve is normal in structure. Aortic valve regurgitation is not visualized. FINDINGS  Left Ventricle: Left ventricular ejection fraction, by estimation, is 55 to 60%. The left ventricle has normal function. The left ventricle has no regional wall motion abnormalities. The left ventricular internal cavity size was normal in size. There is  no left ventricular hypertrophy. Left ventricular diastolic parameters are consistent with Grade II diastolic dysfunction (pseudonormalization). Right Ventricle: The right ventricular size is normal. No increase in right ventricular wall thickness. Right ventricular systolic function is normal. Left Atrium: Left atrial size was mildly dilated. Right Atrium: Right atrial size was  normal in size. Pericardium: A small pericardial effusion is present. Mitral Valve: The mitral valve is normal in structure. No evidence of mitral valve regurgitation. MV peak gradient, 8.4 mmHg. The mean mitral valve gradient is 4.0 mmHg. Tricuspid Valve: The tricuspid valve is normal in structure. Tricuspid valve regurgitation is mild to moderate. Aortic Valve: The aortic valve is normal in structure. Aortic valve regurgitation is not visualized. Aortic valve mean gradient measures 7.0 mmHg. Aortic valve peak gradient measures 14.7 mmHg. Aortic valve area, by VTI measures 1.30 cm. Pulmonic Valve: The pulmonic valve was normal in structure. Pulmonic valve regurgitation is trivial. Aorta: The aortic root and ascending aorta are structurally normal, with no evidence of dilitation. IAS/Shunts: The atrial septum is grossly normal.  LEFT VENTRICLE PLAX 2D LVIDd:         4.90 cm  Diastology LVIDs:         3.40 cm  LV e' medial:    5.44 cm/s LV PW:         1.40 cm  LV E/e' medial:  24.1 LV IVS:        1.00 cm  LV e' lateral:   8.05 cm/s LVOT diam:     1.80 cm  LV E/e' lateral: 16.3 LV SV:         54 LV SV Index:   28 LVOT Area:     2.54 cm  RIGHT VENTRICLE             IVC RV Basal diam:  3.80 cm     IVC diam: 1.20 cm RV S prime:     13.30 cm/s TAPSE (M-mode): 2.4 cm LEFT ATRIUM             Index       RIGHT ATRIUM           Index LA diam:        4.50 cm 2.34 cm/m  RA Area:     16.10 cm LA Vol (A2C):   67.2 ml 34.93 ml/m RA Volume:   41.70 ml  21.68 ml/m LA Vol (A4C):   65.9 ml 34.26 ml/m LA Biplane Vol: 72.4 ml 37.64 ml/m  AORTIC VALVE AV Area (Vmax):    1.22 cm AV Area (Vmean):   1.19 cm AV Area (VTI):     1.30 cm AV Vmax:           192.00 cm/s AV Vmean:          127.000 cm/s AV VTI:            0.419 m AV Peak Grad:      14.7 mmHg AV Mean Grad:      7.0 mmHg LVOT Vmax:         91.70 cm/s LVOT Vmean:        59.500 cm/s LVOT VTI:          0.214 m LVOT/AV VTI ratio: 0.51  AORTA Ao Root diam: 2.50 cm MITRAL VALVE                 TRICUSPID VALVE MV Area (PHT): 4.60 cm     TR Peak grad:   52.4 mmHg MV Area VTI:   1.53 cm     TR Vmax:        362.00 cm/s MV Peak grad:  8.4 mmHg MV Mean grad:  4.0 mmHg     SHUNTS MV Vmax:       1.45 m/s     Systemic VTI:  0.21 m MV Vmean:      89.5 cm/s    Systemic Diam: 1.80 cm MV Decel Time: 165 msec MV E velocity: 131.00 cm/s MV A velocity: 102.00 cm/s MV E/A ratio:  1.28 Mertie Moores MD Electronically signed by Mertie Moores MD Signature Date/Time: 04/01/2021/3:33:24 PM    Final      CBC Recent Labs  Lab 04/22/21 2134 04/23/21 0626 04/23/21 1216  WBC 6.8 6.2  --   HGB 6.9* 6.1* 7.2*  HCT 23.6* 21.2* 23.7*  PLT 221 198  --   MCV 92.2 93.0  --   MCH 27.0 26.8  --   MCHC 29.2* 28.8*  --   RDW 17.2* 17.2*  --   LYMPHSABS 1.2  --   --   MONOABS 0.8  --   --   EOSABS 0.3  --   --   BASOSABS 0.0  --   --     Chemistries  Recent Labs  Lab 04/22/21 2134 04/23/21 0626  NA 138 139  K 5.0 4.7  CL 96* 98  CO2 25 23  GLUCOSE 230* 120*  BUN 43* 46*  CREATININE 4.57* 4.86*  CALCIUM 9.5 9.3  MG  --  2.3  AST 11* 12*  ALT 13 12  ALKPHOS 146* 119  BILITOT 0.6 0.5   ------------------------------------------------------------------------------------------------------------------ No results for input(s): CHOL, HDL, LDLCALC, TRIG, CHOLHDL, LDLDIRECT in the last 72 hours.  Lab Results  Component Value Date   HGBA1C 7.3 (H) 03/30/2021   ------------------------------------------------------------------------------------------------------------------ No results for input(s): TSH, T4TOTAL, T3FREE, THYROIDAB in the last 72 hours.  Invalid input(s): FREET3 ------------------------------------------------------------------------------------------------------------------ No results for input(s): VITAMINB12, FOLATE, FERRITIN, TIBC, IRON, RETICCTPCT in the last 72 hours.  Coagulation profile Recent Labs  Lab 04/22/21 2134 04/23/21 0626  INR 1.1 1.2    No  results for input(s): DDIMER in the last 72 hours.  Cardiac Enzymes No results for input(s): CKMB,  TROPONINI, MYOGLOBIN in the last 168 hours.  Invalid input(s): CK ------------------------------------------------------------------------------------------------------------------    Component Value Date/Time   BNP 1,068.9 (H) 03/30/2021 1658     Roxan Hockey M.D on 04/23/2021 at 6:50 PM  Go to [email or web address] - for contact info  [facility] - Office  [phone]

## 2021-04-23 NOTE — H&P (View-Only) (Signed)
Referring Provider: Roxan Hockey, MD Primary Care Physician:  Leeanne Rio, MD Primary Gastroenterologist:  Elon Alas. Abbey Chatters, DO  Reason for Consultation:  GI bleed  HPI: Amy Moses is a 50 y.o. female with history of end-stage renal disease on hemodialysis, hypertension, type 2 diabetes mellitus, right eye blindness, hypothyroidism, bilateral thigh wounds due to calciphylaxis with wound VAC presenting to the emergency department for evaluation of blood in stool. She had been on dialysis day of presentation but did not get dialyzed because her AV fistula was infiltrated.  In the ED: Sodium 138, glucose 230, BUN 43, creatinine 4.57, albumin 3.1, alkaline phosphatase 146, total bilirubin 0.6, ALT 13, AST 11, white blood cell count 6800, hemoglobin 6.9 (down from 10.23 weeks ago), hematocrit 23.6, MCV 92.2, platelets 221,000.  INR 1.1.  Heme positive.  She has remained hemodynamically stable.  Currently receiving IV pantoprazole twice daily.  She is NPO.  First unit of blood to be transfused, pending.  COVID testing pending.  Patient states over the past 1 to 2 weeks she has seen blood in her stools.  She describes passing dark blood with what appeared to be clots on several occasions.  Does not occur with each stool.  Denies any associated abdominal pain or rectal pain.  Typically has a bowel movement every time she eats, therefore will have 3 or 4/day.  Stools typically are soft to loose.  Occasionally she will take Imodium or Pepto-Bismol, maybe once a week at the most for loose stools.  She notes that the Pepto sometimes causes her stools to be black.  She denies any nausea or vomiting, abdominal pain, heartburn, dysphagia, unintentional weight loss.  No prior upper endoscopy or colonoscopy.  She takes aspirin 325 mg daily.  Denies any other NSAIDs.   Prior to Admission medications   Medication Sig Start Date End Date Taking? Authorizing Provider  acetaminophen (TYLENOL) 500 MG  tablet Take 1,000 mg by mouth every 6 (six) hours as needed for moderate pain.    [provider]  aspirin 325 MG EC tablet Take 325 mg by mouth daily.    [provider]  atorvastatin (LIPITOR) 10 MG tablet Take 10 mg by mouth daily. 05/14/20   [provider]  epoetin alfa (EPOGEN) 4000 UNIT/ML injection Inject 4,000 Units into the skin every 14 (fourteen) days.  06/19/20   [provider]  HUMALOG KWIKPEN 100 UNIT/ML KwikPen Inject 5-15 Units into the skin 3 (three) times daily with meals. 03/03/20   [provider]  insulin glargine (LANTUS) 100 UNIT/ML injection Inject 50 Units into the skin at bedtime.    [provider]  levothyroxine (SYNTHROID) 50 MCG tablet Take 50 mcg by mouth daily before breakfast.    [provider]  metoprolol succinate (TOPROL-XL) 50 MG 24 hr tablet Take 50 mg by mouth at bedtime. Take with or immediately following a meal.    [provider]  sevelamer carbonate (RENVELA) 800 MG tablet Take 800 mg by mouth 3 (three) times daily with meals. 10/14/20   [provider]  sodium bicarbonate 650 MG tablet Take 2 tablets (1,300 mg total) by mouth 3 (three) times daily. 04/01/21   Debbe Odea, MD  torsemide (DEMADEX) 100 MG tablet Take 150 mg by mouth daily.  05/22/20   [provider]    Current Facility-Administered Medications  Medication Dose Route Frequency Provider Last Rate Last Admin  . 0.9 %  sodium chloride infusion (Manually program via Guardrails IV Fluids)  Intravenous Once Adefeso, Oladapo, DO      . insulin aspart (novoLOG) injection 0-5 Units  0-5 Units Subcutaneous QHS Adefeso, Oladapo, DO      . insulin aspart (novoLOG) injection 0-6 Units  0-6 Units Subcutaneous TID WC Adefeso, Oladapo, DO      . insulin glargine (LANTUS) injection 10 Units  10 Units Subcutaneous QHS Adefeso, Oladapo, DO      . pantoprazole (PROTONIX) injection 40 mg  40 mg Intravenous Q12H Adefeso,  Oladapo, DO        Allergies as of 04/22/2021 - Review Complete 04/22/2021  Allergen Reaction Noted  . Ace inhibitors Cough 11/19/2020    Past Medical History:  Diagnosis Date  . Anemia   . 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 Diabetes mellitus, type II ([place])   . Dyslipidemia   . Glaucoma   . Hypertension   . Hypothyroidism (acquired)   . Kidney disease    Stage 5  . Pneumonia     Past Surgical History:  Procedure Laterality Date  . ABDOMINAL AORTOGRAM W/LOWER EXTREMITY Bilateral 12/18/2020   Procedure: ABDOMINAL AORTOGRAM W/LOWER EXTREMITY;  Surgeon: Elam Dutch, MD;  Location: [facility];  Service: Cardiovascular;  Laterality: Bilateral;  . ANKLE FRACTURE SURGERY    . AV FISTULA PLACEMENT Left 08/18/2020   Procedure: LEFT ARM BRACHIOCEPHALIC ARTERIOVENOUS (AV) FISTULA CREATION;  Surgeon: Elam Dutch, MD;  Location: [facility];  Service: Vascular;  Laterality: Left;  . CESAREAN SECTION    . CHOLECYSTECTOMY    . EYE SURGERY     Vatrectomy  . IR PERC TUN PERIT CATH WO PORT S&I D[facility]a [facility]  09/15/2020  . IR REMOVAL TUN CV CATH W/O FL  02/19/2021  . IR US GUIDE VASC ACCESS RIGHT  09/15/2020  . TOE SURGERY      Family History  Problem Relation Age of Onset  . Heart disease Mother   . Diabetes Mother   . Kidney disease Mother   . Diabetes Father   . Heart disease Father   . Diabetes Brother   . Colon cancer Neg Hx     Social History   Socioeconomic History  . Marital status: Single    Spouse name: Not on file  . Number of children: Not on file  . Years of education: Not on file  . Highest education level: Not on file  Occupational History  . Not on file  Tobacco Use  . Smoking status: Never Smoker  . Smokeless tobacco: Never Used  Vaping Use  . Vaping Use: Never used  Substance and Sexual Activity  . Alcohol use: No  . Drug use: No  . Sexual activity: Yes    Birth control/protection: Condom  Other  Topics Concern  . Not on file  Social History Narrative  . Not on file   Social Determinants of Health   Financial Resource Strain: Not on file  Food Insecurity: Not on file  Transportation Needs: Not on file  Physical Activity: Not on file  Stress: Not on file  Social Connections: Not on file  Intimate Partner Violence: Not on file     ROS:  General: Negative for anorexia, weight loss, fever, chills, fatigue, weakness. Eyes: Negative for vision changes.  ENT: Negative for hoarseness, difficulty swallowing , nasal congestion. CV: Negative for chest pain, angina, palpitations, dyspnea on exertion, peripheral edema.  Respiratory: Negative for dyspnea at rest, dyspnea on exertion, cough,  sputum, wheezing.  GI: See history of present illness. GU:  Negative for dysuria, hematuria, urinary incontinence, urinary frequency, nocturnal urination.  MS: Negative for joint pain, low back pain.  Wound VAC applied to bilateral thighs Derm: Negative for rash or itching.  Neuro: Negative for weakness, abnormal sensation, seizure, frequent headaches, memory loss, confusion.  Psych: Negative for anxiety, depression, suicidal ideation, hallucinations.  Endo: Negative for unusual weight change.  Heme: Negative for bruising or bleeding. Allergy: Negative for rash or hives.       Physical Examination: Vital signs in last 24 hours: Temp:  [97.9 F (36.6 C)-98 F (36.7 C)] 97.9 F (36.6 C) (06/03 0352) Pulse Rate:  [66-69] 69 (06/03 0352) Resp:  [14-20] 16 (06/03 0352) BP: (97-134)/(54-78) 101/55 (06/03 0352) SpO2:  [90 %-100 %] 100 % (06/03 0352)    General: Well-nourished, well-developed in no acute distress.  Head: Normocephalic, atraumatic.   Eyes: Conjunctiva pink, no icterus. Mouth: Oropharyngeal mucosa moist and pink , no lesions erythema or exudate. Neck: Supple without thyromegaly, masses, or lymphadenopathy.  Lungs: Clear to auscultation bilaterally.  Heart: Regular rate and  rhythm, no murmurs rubs or gallops.  Abdomen: Bowel sounds are normal, nontender, nondistended, no hepatosplenomegaly or masses, no abdominal bruits or    hernia , no rebound or guarding.   Rectal: Not performed Extremities: No lower extremity edema, clubbing, deformity.  Wound VAC applied to bilateral thighs Neuro: Alert and oriented x 4 , grossly normal neurologically.  Skin: Warm and dry, no rash or jaundice.   Psych: Alert and cooperative, normal mood and affect.        Intake/Output from previous day: No intake/output data recorded. Intake/Output this shift: No intake/output data recorded.  Lab Results: CBC Recent Labs    04/22/21 2134 04/23/21 0626  WBC 6.8 6.2  HGB 6.9* 6.1*  HCT 23.6* 21.2*  MCV 92.2 93.0  PLT 221 198   BMET Recent Labs    04/22/21 2134 04/23/21 0626  NA 138 139  K 5.0 4.7  CL 96* 98  CO2 25 23  GLUCOSE 230* 120*  BUN 43* 46*  CREATININE 4.57* 4.86*  CALCIUM 9.5 9.3   LFT Recent Labs    04/22/21 2134 04/23/21 0626  BILITOT 0.6 0.5  ALKPHOS 146* 119  AST 11* 12*  ALT 13 12  PROT 6.6 5.9*  ALBUMIN 3.1* 2.8*    Lipase No results for input(s): LIPASE in the last 72 hours.  PT/INR Recent Labs    04/22/21 2134  LABPROT 13.7  INR 1.1      Imaging Studies: ECHOCARDIOGRAM COMPLETE  Result Date: 04/01/2021    ECHOCARDIOGRAM REPORT   Patient Name:   JO-ANNE OBRION Date of Exam: 04/01/2021 Medical Rec #:  [id number]       Height:       66.0 in Accession #:    [id number]      Weight:       182.5 lb Date of Birth:  August 12, 1971      BSA:          1.924 m Patient Age:    6 years        BP:           125/70 mmHg Patient Gender: F               HR:           67 bpm. Exam Location:  Inpatient Procedure: 2D Echo, Cardiac Doppler and Color Doppler Indications:  CHF  History:        Patient has prior history of Echocardiogram examinations, most                 recent 06/10/2020. Risk Factors:Hypertension and Diabetes.  Sonographer:    Cammy Brochure Referring Phys: [place]  1. Left ventricular ejection fraction, by estimation, is 55 to 60%. The left ventricle has normal function. The left ventricle has no regional wall motion abnormalities. Left ventricular diastolic parameters are consistent with Grade II diastolic dysfunction (pseudonormalization).  2. Right ventricular systolic function is normal. The right ventricular size is normal.  3. Left atrial size was mildly dilated.  4. A small pericardial effusion is present.  5. The mitral valve is normal in structure. No evidence of mitral valve regurgitation.  6. Tricuspid valve regurgitation is mild to moderate.  7. The aortic valve is normal in structure. Aortic valve regurgitation is not visualized. FINDINGS  Left Ventricle: Left ventricular ejection fraction, by estimation, is 55 to 60%. The left ventricle has normal function. The left ventricle has no regional wall motion abnormalities. The left ventricular internal cavity size was normal in size. There is  no left ventricular hypertrophy. Left ventricular diastolic parameters are consistent with Grade II diastolic dysfunction (pseudonormalization). Right Ventricle: The right ventricular size is normal. No increase in right ventricular wall thickness. Right ventricular systolic function is normal. Left Atrium: Left atrial size was mildly dilated. Right Atrium: Right atrial size was normal in size. Pericardium: A small pericardial effusion is present. Mitral Valve: The mitral valve is normal in structure. No evidence of mitral valve regurgitation. MV peak gradient, 8.4 mmHg. The mean mitral valve gradient is 4.0 mmHg. Tricuspid Valve: The tricuspid valve is normal in structure. Tricuspid valve regurgitation is mild to moderate. Aortic Valve: The aortic valve is normal in structure. Aortic valve regurgitation is not visualized. Aortic valve mean gradient measures 7.0 mmHg. Aortic valve peak gradient measures 14.7 mmHg. Aortic valve  area, by VTI measures 1.30 cm. Pulmonic Valve: The pulmonic valve was normal in structure. Pulmonic valve regurgitation is trivial. Aorta: The aortic root and ascending aorta are structurally normal, with no evidence of dilitation. IAS/Shunts: The atrial septum is grossly normal.  LEFT VENTRICLE PLAX 2D LVIDd:         4.90 cm  Diastology LVIDs:         3.40 cm  LV e' medial:    5.44 cm/s LV PW:         1.40 cm  LV E/e' medial:  24.1 LV IVS:        1.00 cm  LV e' lateral:   8.05 cm/s LVOT diam:     1.80 cm  LV E/e' lateral: 16.3 LV SV:         54 LV SV Index:   28 LVOT Area:     2.54 cm  RIGHT VENTRICLE             IVC RV Basal diam:  3.80 cm     IVC diam: 1.20 cm RV S prime:     13.30 cm/s TAPSE (M-mode): 2.4 cm LEFT ATRIUM             Index       RIGHT ATRIUM           Index LA diam:        4.50 cm 2.34 cm/m  RA Area:     16.10 cm LA Vol (A2C):  67.2 ml 34.93 ml/m RA Volume:   41.70 ml  21.68 ml/m LA Vol (A4C):   65.9 ml 34.26 ml/m LA Biplane Vol: 72.4 ml 37.64 ml/m  AORTIC VALVE AV Area (Vmax):    1.22 cm AV Area (Vmean):   1.19 cm AV Area (VTI):     1.30 cm AV Vmax:           192.00 cm/s AV Vmean:          127.000 cm/s AV VTI:            0.419 m AV Peak Grad:      14.7 mmHg AV Mean Grad:      7.0 mmHg LVOT Vmax:         91.70 cm/s LVOT Vmean:        59.500 cm/s LVOT VTI:          0.214 m LVOT/AV VTI ratio: 0.51  AORTA Ao Root diam: 2.50 cm MITRAL VALVE                TRICUSPID VALVE MV Area (PHT): 4.60 cm     TR Peak grad:   52.4 mmHg MV Area VTI:   1.53 cm     TR Vmax:        362.00 cm/s MV Peak grad:  8.4 mmHg MV Mean grad:  4.0 mmHg     SHUNTS MV Vmax:       1.45 m/s     Systemic VTI:  0.21 m MV Vmean:      89.5 cm/s    Systemic Diam: 1.80 cm MV Decel Time: 165 msec MV E velocity: 131.00 cm/s MV A velocity: 102.00 cm/s MV E/A ratio:  1.28 Mertie Moores MD Electronically signed by Mertie Moores MD Signature Date/Time: 04/01/2021/3:33:24 PM    Final   Tyson Dense week]   Impression: Pleasant 50 year old  female with end-stage renal disease on hemodialysis, hypothyroidism, hypertension, type 2 diabetes mellitus, bilateral inner thigh wounds due to calciphylaxis presenting to the emergency department with bloody stools.  GI bleed: Dark blood with clots for 1 to 2 weeks off and on.  No associated rectal pain or abdominal pain.  Stool color is dark brown.  Hemoglobin on presentation 6.9, 3 weeks ago was 10.2.  Hemoglobin down to 6.1 today.  Platelets and INR normal.  Takes aspirin 325 mg daily but no other NSAIDs.  No prior EGD or colonoscopy.  No other GI symptoms.  Suspect lower GI bleed based on patient's description.  Less likely rapid transit upper GI bleed.  Patient has 1 unit of packed red blood cells ordered and currently transfusing.   Plan: 1. Transfuse packed red blood cells as needed.  She will will likely need 2 units. 2. Plan for colonoscopy with possible upper endoscopy tomorrow.  I have discussed the risks, alternatives, benefits with regards to but not limited to the risk of reaction to medication, bleeding, infection, perforation and the patient is agreeable to proceed. Written consent to be obtained. 3. Clear liquid diet today, n.p.o. after midnight. 4. Continue PPI IV twice daily.  We would like to thank you for the opportunity to participate in the care of Aastha Shek.  Laureen Ochs. Bernarda Caffey [facility] [phone] 6/3/20229:08 AM     LOS: 0 days

## 2021-04-23 NOTE — Consult Note (Addendum)
[facility] Nephrology Consult Note: Reason for Consult: To manage dialysis and dialysis related needs Referring Physician: Dr. Denton Brick, [place]  HPI:  Amy Moses is an 50 y.o. female with past medical history of hypertension, DM, ESRD on HD TTS at [facility] in [place], calciphylaxis, anemia, admitted with GI bleed seen as a consultation for the management of ESRD. Per patient she had black-colored stool for about 2 weeks therefore she called her PCP who directed her to come to ER for further evaluation.  She denies nausea, vomiting, chest pain, shortness of breath or abdominal pain.  She went to her outpatient dialysis unit yesterday where she was not able to receive dialysis because of AV fistula infiltration.  She has been dealing with bilateral leg wounds status post wound VAC.  The wound was thought to be due to calciphylaxis and on sodium thiosulfate. On admission, she was hypotensive.  Hemoglobin was 6.9 therefore received blood transfusion.  GI was consulted and plan for possible endoscopy tomorrow.  HD orders [place] [facility]  TTS 4 hours  edw 87, BP 119/76 AVF BFR 400 DFR 800 2 K /2.5 calc  Heparin-   20,000, sodium thiosulfate 25 grams, on renvela for binder  Past Medical History:  Diagnosis Date  . Anemia   . 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 Diabetes mellitus, type II ([place])   . Dyslipidemia   . Glaucoma   . Hypertension   . Hypothyroidism (acquired)   . Kidney disease    Stage 5  . Pneumonia     Past Surgical History:  Procedure Laterality Date  . ABDOMINAL AORTOGRAM W/LOWER EXTREMITY Bilateral 12/18/2020   Procedure: ABDOMINAL AORTOGRAM W/LOWER EXTREMITY;  Surgeon: Elam Dutch, MD;  Location: [facility];  Service: Cardiovascular;  Laterality: Bilateral;  . ANKLE FRACTURE SURGERY    . AV FISTULA PLACEMENT Left 08/18/2020   Procedure: LEFT ARM BRACHIOCEPHALIC ARTERIOVENOUS (AV)  FISTULA CREATION;  Surgeon: Elam Dutch, MD;  Location: [facility];  Service: Vascular;  Laterality: Left;  . CESAREAN SECTION    . CHOLECYSTECTOMY    . EYE SURGERY     Vatrectomy  . IR PERC TUN PERIT CATH WO PORT S&I D[facility]a [facility]  09/15/2020  . IR REMOVAL TUN CV CATH W/O FL  02/19/2021  . IR US GUIDE VASC ACCESS RIGHT  09/15/2020  . TOE SURGERY      Family History  Problem Relation Age of Onset  . Heart disease Mother   . Diabetes Mother   . Kidney disease Mother   . Diabetes Father   . Heart disease Father   . Diabetes Brother   . Colon cancer Neg Hx     Social History:  reports that she has never smoked. She has never used smokeless tobacco. She reports that she does not drink alcohol and does not use drugs.  Allergies:  Allergies  Allergen Reactions  . Ace Inhibitors Cough    Medications: I have reviewed the patient's current medications.   Results for orders placed or performed during the hospital encounter of 04/22/21 (from the past 48 hour(s))  Comprehensive metabolic panel     Status: Abnormal   Collection Time: 04/22/21  9:34 PM  Result Value Ref Range   Sodium 138 135 - 145 mmol/L   Potassium 5.0 3.5 - 5.1 mmol/L   Chloride 96 (L) 98 - 111 mmol/L   CO2 25 22 - 32 mmol/L  Glucose, Bld 230 (H) 70 - 99 mg/dL    Comment: Glucose reference range applies only to samples taken after fasting for at least 8 hours.   BUN 43 (H) 6 - 20 mg/dL   Creatinine, Ser 4.57 (H) 0.44 - 1.00 mg/dL   Calcium 9.5 8.9 - 10.3 mg/dL   Total Protein 6.6 6.5 - 8.1 g/dL   Albumin 3.1 (L) 3.5 - 5.0 g/dL   AST 11 (L) 15 - 41 U/L   ALT 13 0 - 44 U/L   Alkaline Phosphatase 146 (H) 38 - 126 U/L   Total Bilirubin 0.6 0.3 - 1.2 mg/dL   GFR, Estimated 11 (L) >60 mL/min    Comment: (NOTE) Calculated using the CKD-EPI Creatinine Equation (2021)    Anion gap 17 (H) 5 - 15    Comment: Performed at [facility], [address] [place], [place] [address]  CBC WITH DIFFERENTIAL     Status: Abnormal    Collection Time: 04/22/21  9:34 PM  Result Value Ref Range   WBC 6.8 4.0 - 10.5 K/uL   RBC 2.56 (L) 3.87 - 5.11 MIL/uL   Hemoglobin 6.9 (LL) 12.0 - 15.0 g/dL    Comment: REPEATED TO VERIFY THIS CRITICAL RESULT HAS VERIFIED AND BEEN CALLED TO T WALKER,RN BY MARIE KELLY ON 06 02 2022 AT 2236, AND HAS BEEN READ BACK.     HCT 23.6 (L) 36.0 - 46.0 %   MCV 92.2 80.0 - 100.0 fL   MCH 27.0 26.0 - 34.0 pg   MCHC 29.2 (L) 30.0 - 36.0 g/dL   RDW 17.2 (H) 11.5 - 15.5 %   Platelets 221 150 - 400 K/uL   nRBC 0.0 0.0 - 0.2 %   Neutrophils Relative % 67 %   Neutro Abs 4.5 1.7 - 7.7 K/uL   Lymphocytes Relative 18 %   Lymphs Abs 1.2 0.7 - 4.0 K/uL   Monocytes Relative 11 %   Monocytes Absolute 0.8 0.1 - 1.0 K/uL   Eosinophils Relative 4 %   Eosinophils Absolute 0.3 0.0 - 0.5 K/uL   Basophils Relative 0 %   Basophils Absolute 0.0 0.0 - 0.1 K/uL   Immature Granulocytes 0 %   Abs Immature Granulocytes 0.03 0.00 - 0.07 K/uL    Comment: Performed at [facility], [address], [place], [place] [address]  Protime-INR     Status: None   Collection Time: 04/22/21  9:34 PM  Result Value Ref Range   Prothrombin Time 13.7 11.4 - 15.2 seconds   INR 1.1 0.8 - 1.2    Comment: (NOTE) INR goal varies based on device and disease states. Performed at [facility], [address] [place], [place] [address]   Type and screen [facility]     Status: None (Preliminary result)   Collection Time: 04/22/21 10:00 PM  Result Value Ref Range   ABO/RH(D) O POS    Antibody Screen NEG    Sample Expiration 04/25/2021,2359    Unit Number [id number]    Blood Component Type RED CELLS,LR    Unit division 00    Status of Unit ISSUED    Transfusion Status OK TO TRANSFUSE    Crossmatch Result      Compatible Performed at [facility], [address] [place], [place] [address]    Unit Number [id number]    Blood Component Type RED CELLS,LR    Unit division 00    Status of Unit ALLOCATED    Transfusion  Status  OK TO TRANSFUSE    Crossmatch Result Compatible   Prepare RBC (crossmatch)     Status: None   Collection Time: 04/22/21 10:00 PM  Result Value Ref Range   Order Confirmation      ORDER PROCESSED BY BLOOD BANK Performed at [facility], [address], [place], [place] [address]   Prepare RBC (crossmatch)     Status: None   Collection Time: 04/22/21 10:00 PM  Result Value Ref Range   Order Confirmation      ORDER PROCESSED BY BLOOD BANK Performed at [facility], [address] [place], [place] [address]   POC occult blood, ED     Status: Abnormal   Collection Time: 04/22/21 11:45 PM  Result Value Ref Range   Fecal Occult Bld POSITIVE (A) NEGATIVE  Comprehensive metabolic panel     Status: Abnormal   Collection Time: 04/23/21  6:26 AM  Result Value Ref Range   Sodium 139 135 - 145 mmol/L   Potassium 4.7 3.5 - 5.1 mmol/L   Chloride 98 98 - 111 mmol/L   CO2 23 22 - 32 mmol/L   Glucose, Bld 120 (H) 70 - 99 mg/dL    Comment: Glucose reference range applies only to samples taken after fasting for at least 8 hours.   BUN 46 (H) 6 - 20 mg/dL   Creatinine, Ser 4.86 (H) 0.44 - 1.00 mg/dL   Calcium 9.3 8.9 - 10.3 mg/dL   Total Protein 5.9 (L) 6.5 - 8.1 g/dL   Albumin 2.8 (L) 3.5 - 5.0 g/dL   AST 12 (L) 15 - 41 U/L   ALT 12 0 - 44 U/L   Alkaline Phosphatase 119 38 - 126 U/L   Total Bilirubin 0.5 0.3 - 1.2 mg/dL   GFR, Estimated 10 (L) >60 mL/min    Comment: (NOTE) Calculated using the CKD-EPI Creatinine Equation (2021)    Anion gap 18 (H) 5 - 15    Comment: Performed at [facility], [address], [place], [place] [address]  CBC     Status: Abnormal   Collection Time: 04/23/21  6:26 AM  Result Value Ref Range   WBC 6.2 4.0 - 10.5 K/uL   RBC 2.28 (L) 3.87 - 5.11 MIL/uL   Hemoglobin 6.1 (LL) 12.0 - 15.0 g/dL    Comment: CRITICAL RESULT CALLED TO, READ BACK BY AND VERIFIED WITH: SHORT, B BY STEPHTR 6.3.22 0736 MATTHEWS, B     HCT 21.2 (L) 36.0 - 46.0 %   MCV 93.0 80.0 -  100.0 fL   MCH 26.8 26.0 - 34.0 pg   MCHC 28.8 (L) 30.0 - 36.0 g/dL   RDW 17.2 (H) 11.5 - 15.5 %   Platelets 198 150 - 400 K/uL   nRBC 0.0 0.0 - 0.2 %    Comment: Performed at [facility], [address], [place], [name] [address]  Protime-INR     Status: None   Collection Time: 04/23/21  6:26 AM  Result Value Ref Range   Prothrombin Time 14.9 11.4 - 15.2 seconds   INR 1.2 0.8 - 1.2    Comment: (NOTE) INR goal varies based on device and disease states. Performed at [facility], [address] [place], [place] [address]   APTT     Status: None   Collection Time: 04/23/21  6:26 AM  Result Value Ref Range   aPTT 34 24 - 36 seconds    Comment: Performed at [facility], [address], [place], [place] [address]  Magnesium     Status: None   Collection Time: 04/23/21  6:26 AM  Result Value Ref Range   Magnesium 2.3 1.7 - 2.4 mg/dL    Comment: Performed at [facility], [address] [place], [place] [address]  Phosphorus     Status: Abnormal   Collection Time: 04/23/21  6:26 AM  Result Value Ref Range   Phosphorus 7.2 (H) 2.5 - 4.6 mg/dL    Comment: Performed at [facility], [address] [place], [place] [address]  Glucose, capillary     Status: Abnormal   Collection Time: 04/23/21  7:39 AM  Result Value Ref Range   Glucose-Capillary 132 (H) 70 - 99 mg/dL    Comment: Glucose reference range applies only to samples taken after fasting for at least 8 hours.    No results found.  ROS: As per H&P.  Rest systems reviewed and negative. Blood pressure (!) 101/57, pulse 70, temperature 98.2 F (36.8 C), temperature source Oral, resp. rate 18, height '5\' 6"'$  (1.676 m), last menstrual period 08/22/2015, SpO2 98 %. Gen: NAD, comfortable Respiratory: Clear bilateral, no wheezing or crackle Cardiovascular: Regular rate rhythm S1-S2 normal, no rubs GI: Abdomen soft, nontender, nondistended Extremities: Bilateral inner thigh has a wound with wound VAC.  The legs are swollen. Skin:  Wound on both legs as above. Neurology: Alert, awake, following commands, oriented Dialysis Access: AV fistula has good thrill and bruit.  Assessment/Plan:  #GI bleed/acute blood loss anemia: Receiving PRBC transfusion.  Seen by GI and possible endoscopy/colonoscopy tomorrow.  Avoid heparin.  # ESRD: TTS at [facility] in [place].  Missed dialysis yesterday because of aVF infiltration.  She has bilateral leg swelling and reports above her dry weight.  We will do HD today to attempt ultrafiltration.  Blood pressure is soft therefore ordered midodrine and may need albumin pre-HD.  AV fistula has a good thrill and bruit.  We will do short HD tomorrow to resume her TTS schedule.  I have discussed with HD nurse.  # Hypertension: BP soft.  Holding antihypertensives.  Monitor BP.  UF as tolerated.  # Metabolic Bone Disease: Phosphorus elevated.  She is currently on clear liquid diet.  I will start normal calcium based binders/sevelamer.  Monitor lab.  #Calciphylaxis and bilateral inner thigh wound with wound VAC.  Resume sodium thiosulfate.  Use low calcium bath and known calcium based binders.  Continue wound care.  Thank you for the consult.  Please call us if needed over the weekend.  Rishabh Rinkenberger Tanna Furry 04/23/2021, 10:36 AM

## 2021-04-23 NOTE — Progress Notes (Signed)
Patient did not receive unit of blood in the emergency room.  Notified MD that blood was not given downstairs due to no order for transfusion.  Received order to transfuse blood.  Blood will be given on unit 300.

## 2021-04-23 NOTE — Consult Note (Addendum)
Consult received for wound vac. Per note from Jacques Navy RN, this dressing was changed earlier today. [place] team will not follow at this time. Please re-consult or page if needed for trouble shooting. [place] team not in the office until Monday.   Cathlean Marseilles Tamala Julian, MSN, RN, [place], Lysle Pearl, [name] Wound Treatment Associate Pager [phone]

## 2021-04-23 NOTE — Progress Notes (Signed)
Patient to floor with two wounds vacs connected to bilateral inner thighs (from home).  Patient has  Stage 2 to sacrum measuring 1cm x 1cm. Patient has a stage two to sacrum measuring 0.5cm x 0.5 cm.  Patient has a stage 2 to sacrum measuing 1.8 x 0.3 cm.  On left lower leg, there is an 3cm x 1 cm unstageable left lower leg.  Patient has 4 x3 cm  To left lower leg.   Patient has a   2cm x 0.5 cm on left lower, stage 2.

## 2021-04-23 NOTE — Procedures (Signed)
   HEMODIALYSIS TREATMENT NOTE:  3.5 hour heparin-free HD completed via LUA AVF (15g/antegrade). Goal met: 3 liters removed without interruption in UF.  All blood was returned and hemostasis was achieved in 15 minutes.  No changes from pre-HD assessment.  Rockwell Alexandria, RN

## 2021-04-23 NOTE — Progress Notes (Signed)
Patient called staff in to assess wound vac to right upper leg. Wound vac was reading that a blockage was detected. After trying to clear tubing with no success, entire dressing was changed. Wound bed red and pink with minimal bright red blood. Suction reapplied at 125 mmHg. Will continue to monitor.

## 2021-04-23 NOTE — Evaluation (Signed)
Physical Therapy Evaluation Patient Details Name: Amy Moses MRN: [id number] DOB: [birth year] Today's Date: 04/23/2021   History of Present Illness  Amy Moses is a 50 y.o. female with medical history significant for ESRD on HD (TTS), T2DM, calciphylaxis with wound VAC, right eye blindness, hypothyroidism, hypertension, hyperlipidemia who presents to the emergency department for evaluation of blood in stool.  Patient states that she has noted intermittent black stool for about 1 to 2 weeks, she was concerned, so she called her PCP and she was asked to go to the ED for further evaluation.  Patient states that she went for dialysis today, but her AV fistula was infiltrated, so she was unable to have dialysis.  She ambulates with wheelchair due to right heel ulcer and bilateral thigh wounds due to calciphylaxis with wound VAC.  She denies fever, chills, chest pain, shortness of breath or abdominal pain.    Clinical Impression  Patient functioning at baseline for functional mobility and gait. Patient requires minimal assist for uprighting trunk and for LE movement back into bed. Patient demonstrates good sitting balance and sitting tolerance. Patient states she was ambulating with AD until a fall in Oct 2021 resulted in a patellar fracture and has been using wheelchair since. Patient discharged to care of nursing for mobility daily as tolerated for length of stay.      Follow Up Recommendations No PT follow up    Equipment Recommendations  None recommended by PT    Recommendations for Other Services       Precautions / Restrictions Precautions Precautions: Fall Restrictions Weight Bearing Restrictions: No      Mobility  Bed Mobility Overal bed mobility: Needs Assistance Bed Mobility: Supine to Sit;Sit to Supine     Supine to sit: Min assist;HOB elevated Sit to supine: Min assist   General bed mobility comments: slow, labored transition to seated EOB with minimal assist to  pull to seated and for lifting LEs back into bed    Transfers                    Ambulation/Gait                Stairs            Wheelchair Mobility    Modified Rankin (Stroke Patients Only)       Balance Overall balance assessment: Needs assistance Sitting-balance support: No upper extremity supported Sitting balance-Leahy Scale: Good Sitting balance - Comments: seated EOB                                     Pertinent Vitals/Pain Pain Assessment: No/denies pain    Home Living Family/patient expects to be discharged to:: Private residence Living Arrangements: Spouse/significant other;Children Available Help at Discharge: Family Type of Home: House Home Access: Ramped entrance     Home Layout: One level Home Equipment: Wheelchair - Regulatory affairs officer - single point      Prior Function Level of Independence: Needs assistance   Gait / Transfers Assistance Needed: Patient uses wheelchair for all mobility, able to transfer without assist  ADL's / Homemaking Assistance Needed: family assists PRN, independent with basic ADL  Comments: Patient states she was ambulating with AD until a fall in Oct 2021 resulted in a patellar fracture and has been using wheelchair since     Hand Dominance        Extremity/Trunk Assessment  Upper Extremity Assessment Upper Extremity Assessment: Defer to OT evaluation    Lower Extremity Assessment Lower Extremity Assessment: Generalized weakness    Cervical / Trunk Assessment Cervical / Trunk Assessment: Normal  Communication   Communication: No difficulties  Cognition Arousal/Alertness: Awake/alert Behavior During Therapy: WFL for tasks assessed/performed Overall Cognitive Status: Within Functional Limits for tasks assessed                                        General Comments      Exercises     Assessment/Plan    PT Assessment Patent does not need any  further PT services  PT Problem List         PT Treatment Interventions      PT Goals (Current goals can be found in the Care Plan section)  Acute Rehab PT Goals Patient Stated Goal: return home PT Goal Formulation: With patient Time For Goal Achievement: 04/23/21 Potential to Achieve Goals: Good    Frequency     Barriers to discharge        Co-evaluation               AM-PAC PT "6 Clicks" Mobility  Outcome Measure Help needed turning from your back to your side while in a flat bed without using bedrails?: A Little Help needed moving from lying on your back to sitting on the side of a flat bed without using bedrails?: A Little Help needed moving to and from a bed to a chair (including a wheelchair)?: A Little Help needed standing up from a chair using your arms (e.g., wheelchair or bedside chair)?: A Lot Help needed to walk in hospital room?: Total Help needed climbing 3-5 steps with a railing? : Total 6 Click Score: 13    End of Session   Activity Tolerance: Patient tolerated treatment well Patient left: in bed;with call bell/phone within reach Nurse Communication: Mobility status PT Visit Diagnosis: Other abnormalities of gait and mobility (R26.89);Muscle weakness (generalized) (M62.81)    Time: [id number] PT Time Calculation (min) (ACUTE ONLY): 12 min   Charges:   PT Evaluation $PT Eval Low Complexity: 1 Low          10:52 AM, 04/23/21 Mearl Latin PT, DPT Physical Therapist at [facility]

## 2021-04-23 NOTE — Progress Notes (Signed)
OT Screen Note  Patient Details Name: Amy Moses MRN: [id number] DOB: 1971-10-04   Cancelled Treatment:    Reason Eval/Treat Not Completed: OT screened, no needs identified, will sign off. OT orders received. Patient's chart reviewed and spoke with evaluating PT. Patient is currently presenting at her baseline for mobility and basic ADL tasks. She fell and obtained a fracture in October 2021 and has since been utilizing a wheelchair for mobility. Due to multiple wounds that are waiting to heal, patient has not underwent any surgical procedures to address. She is able to complete basic ADL tasks at modified independence and her family is able to assist her PRN. She has all necessary DME at home. At this time, patient does not present with a need for additional OT services. Thank you for the referral.    Ailene Ravel, OTR/L,CBIS  [phone]  04/23/2021, 11:26 AM

## 2021-04-23 NOTE — Consult Note (Signed)
Referring Provider: Roxan Hockey, MD Primary Care Physician:  Leeanne Rio, MD Primary Gastroenterologist:  Elon Alas. Abbey Chatters, DO  Reason for Consultation:  GI bleed  HPI: Amy Moses is a 50 y.o. female with history of end-stage renal disease on hemodialysis, hypertension, type 2 diabetes mellitus, right eye blindness, hypothyroidism, bilateral thigh wounds due to calciphylaxis with wound VAC presenting to the emergency department for evaluation of blood in stool. She had been on dialysis day of presentation but did not get dialyzed because her AV fistula was infiltrated.  In the ED: Sodium 138, glucose 230, BUN 43, creatinine 4.57, albumin 3.1, alkaline phosphatase 146, total bilirubin 0.6, ALT 13, AST 11, white blood cell count 6800, hemoglobin 6.9 (down from 10.23 weeks ago), hematocrit 23.6, MCV 92.2, platelets 221,000.  INR 1.1.  Heme positive.  She has remained hemodynamically stable.  Currently receiving IV pantoprazole twice daily.  She is NPO.  First unit of blood to be transfused, pending.  COVID testing pending.  Patient states over the past 1 to 2 weeks she has seen blood in her stools.  She describes passing dark blood with what appeared to be clots on several occasions.  Does not occur with each stool.  Denies any associated abdominal pain or rectal pain.  Typically has a bowel movement every time she eats, therefore will have 3 or 4/day.  Stools typically are soft to loose.  Occasionally she will take Imodium or Pepto-Bismol, maybe once a week at the most for loose stools.  She notes that the Pepto sometimes causes her stools to be black.  She denies any nausea or vomiting, abdominal pain, heartburn, dysphagia, unintentional weight loss.  No prior upper endoscopy or colonoscopy.  She takes aspirin 325 mg daily.  Denies any other NSAIDs.   Prior to Admission medications   Medication Sig Start Date End Date Taking? Authorizing Provider  acetaminophen (TYLENOL) 500 MG  tablet Take 1,000 mg by mouth every 6 (six) hours as needed for moderate pain.    [provider]  aspirin 325 MG EC tablet Take 325 mg by mouth daily.    [provider]  atorvastatin (LIPITOR) 10 MG tablet Take 10 mg by mouth daily. 05/14/20   [provider]  epoetin alfa (EPOGEN) 4000 UNIT/ML injection Inject 4,000 Units into the skin every 14 (fourteen) days.  06/19/20   [provider]  HUMALOG KWIKPEN 100 UNIT/ML KwikPen Inject 5-15 Units into the skin 3 (three) times daily with meals. 03/03/20   [provider]  insulin glargine (LANTUS) 100 UNIT/ML injection Inject 50 Units into the skin at bedtime.    [provider]  levothyroxine (SYNTHROID) 50 MCG tablet Take 50 mcg by mouth daily before breakfast.    [provider]  metoprolol succinate (TOPROL-XL) 50 MG 24 hr tablet Take 50 mg by mouth at bedtime. Take with or immediately following a meal.    [provider]  sevelamer carbonate (RENVELA) 800 MG tablet Take 800 mg by mouth 3 (three) times daily with meals. 10/14/20   [provider]  sodium bicarbonate 650 MG tablet Take 2 tablets (1,300 mg total) by mouth 3 (three) times daily. 04/01/21   Debbe Odea, MD  torsemide (DEMADEX) 100 MG tablet Take 150 mg by mouth daily.  05/22/20   [provider]    Current Facility-Administered Medications  Medication Dose Route Frequency Provider Last Rate Last Admin  . 0.9 %  sodium chloride infusion (Manually program via Guardrails IV Fluids)  Intravenous Once Adefeso, Oladapo, DO      . insulin aspart (novoLOG) injection 0-5 Units  0-5 Units Subcutaneous QHS Adefeso, Oladapo, DO      . insulin aspart (novoLOG) injection 0-6 Units  0-6 Units Subcutaneous TID WC Adefeso, Oladapo, DO      . insulin glargine (LANTUS) injection 10 Units  10 Units Subcutaneous QHS Adefeso, Oladapo, DO      . pantoprazole (PROTONIX) injection 40 mg  40 mg Intravenous Q12H Adefeso,  Oladapo, DO        Allergies as of 04/22/2021 - Review Complete 04/22/2021  Allergen Reaction Noted  . Ace inhibitors Cough 11/19/2020    Past Medical History:  Diagnosis Date  . Anemia   . 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 Diabetes mellitus, type II ([place])   . Dyslipidemia   . Glaucoma   . Hypertension   . Hypothyroidism (acquired)   . Kidney disease    Stage 5  . Pneumonia     Past Surgical History:  Procedure Laterality Date  . ABDOMINAL AORTOGRAM W/LOWER EXTREMITY Bilateral 12/18/2020   Procedure: ABDOMINAL AORTOGRAM W/LOWER EXTREMITY;  Surgeon: Elam Dutch, MD;  Location: [facility];  Service: Cardiovascular;  Laterality: Bilateral;  . ANKLE FRACTURE SURGERY    . AV FISTULA PLACEMENT Left 08/18/2020   Procedure: LEFT ARM BRACHIOCEPHALIC ARTERIOVENOUS (AV) FISTULA CREATION;  Surgeon: Elam Dutch, MD;  Location: [place];  Service: Vascular;  Laterality: Left;  . CESAREAN SECTION    . CHOLECYSTECTOMY    . EYE SURGERY     Vatrectomy  . IR PERC TUN PERIT CATH WO PORT S&I Dartha Lodge  09/15/2020  . IR REMOVAL TUN CV CATH W/O FL  02/19/2021  . IR US GUIDE VASC ACCESS RIGHT  09/15/2020  . TOE SURGERY      Family History  Problem Relation Age of Onset  . Heart disease Mother   . Diabetes Mother   . Kidney disease Mother   . Diabetes Father   . Heart disease Father   . Diabetes Brother   . Colon cancer Neg Hx     Social History   Socioeconomic History  . Marital status: Single    Spouse name: Not on file  . Number of children: Not on file  . Years of education: Not on file  . Highest education level: Not on file  Occupational History  . Not on file  Tobacco Use  . Smoking status: Never Smoker  . Smokeless tobacco: Never Used  Vaping Use  . Vaping Use: Never used  Substance and Sexual Activity  . Alcohol use: No  . Drug use: No  . Sexual activity: Yes    Birth control/protection: Condom  Other  Topics Concern  . Not on file  Social History Narrative  . Not on file   Social Determinants of Health   Financial Resource Strain: Not on file  Food Insecurity: Not on file  Transportation Needs: Not on file  Physical Activity: Not on file  Stress: Not on file  Social Connections: Not on file  Intimate Partner Violence: Not on file     ROS:  General: Negative for anorexia, weight loss, fever, chills, fatigue, weakness. Eyes: Negative for vision changes.  ENT: Negative for hoarseness, difficulty swallowing , nasal congestion. CV: Negative for chest pain, angina, palpitations, dyspnea on exertion, peripheral edema.  Respiratory: Negative for dyspnea at rest, dyspnea on exertion, cough,  sputum, wheezing.  GI: See history of present illness. GU:  Negative for dysuria, hematuria, urinary incontinence, urinary frequency, nocturnal urination.  MS: Negative for joint pain, low back pain.  Wound VAC applied to bilateral thighs Derm: Negative for rash or itching.  Neuro: Negative for weakness, abnormal sensation, seizure, frequent headaches, memory loss, confusion.  Psych: Negative for anxiety, depression, suicidal ideation, hallucinations.  Endo: Negative for unusual weight change.  Heme: Negative for bruising or bleeding. Allergy: Negative for rash or hives.       Physical Examination: Vital signs in last 24 hours: Temp:  [97.9 F (36.6 C)-98 F (36.7 C)] 97.9 F (36.6 C) (06/03 0352) Pulse Rate:  [66-69] 69 (06/03 0352) Resp:  [14-20] 16 (06/03 0352) BP: (97-134)/(54-78) 101/55 (06/03 0352) SpO2:  [90 %-100 %] 100 % (06/03 0352)    General: Well-nourished, well-developed in no acute distress.  Head: Normocephalic, atraumatic.   Eyes: Conjunctiva pink, no icterus. Mouth: Oropharyngeal mucosa moist and pink , no lesions erythema or exudate. Neck: Supple without thyromegaly, masses, or lymphadenopathy.  Lungs: Clear to auscultation bilaterally.  Heart: Regular rate and  rhythm, no murmurs rubs or gallops.  Abdomen: Bowel sounds are normal, nontender, nondistended, no hepatosplenomegaly or masses, no abdominal bruits or    hernia , no rebound or guarding.   Rectal: Not performed Extremities: No lower extremity edema, clubbing, deformity.  Wound VAC applied to bilateral thighs Neuro: Alert and oriented x 4 , grossly normal neurologically.  Skin: Warm and dry, no rash or jaundice.   Psych: Alert and cooperative, normal mood and affect.        Intake/Output from previous day: No intake/output data recorded. Intake/Output this shift: No intake/output data recorded.  Lab Results: CBC Recent Labs    04/22/21 2134 04/23/21 0626  WBC 6.8 6.2  HGB 6.9* 6.1*  HCT 23.6* 21.2*  MCV 92.2 93.0  PLT 221 198   BMET Recent Labs    04/22/21 2134 04/23/21 0626  NA 138 139  K 5.0 4.7  CL 96* 98  CO2 25 23  GLUCOSE 230* 120*  BUN 43* 46*  CREATININE 4.57* 4.86*  CALCIUM 9.5 9.3   LFT Recent Labs    04/22/21 2134 04/23/21 0626  BILITOT 0.6 0.5  ALKPHOS 146* 119  AST 11* 12*  ALT 13 12  PROT 6.6 5.9*  ALBUMIN 3.1* 2.8*    Lipase No results for input(s): LIPASE in the last 72 hours.  PT/INR Recent Labs    04/22/21 2134  LABPROT 13.7  INR 1.1      Imaging Studies: ECHOCARDIOGRAM COMPLETE  Result Date: 04/01/2021    ECHOCARDIOGRAM REPORT   Patient Name:   KAHLEESI TETTER Date of Exam: 04/01/2021 Medical Rec #:  [id number]       Height:       66.0 in Accession #:    [id number]      Weight:       182.5 lb Date of Birth:  08/27/1971      BSA:          1.924 m Patient Age:    17 years        BP:           125/70 mmHg Patient Gender: F               HR:           67 bpm. Exam Location:  Inpatient Procedure: 2D Echo, Cardiac Doppler and Color Doppler Indications:  CHF  History:        Patient has prior history of Echocardiogram examinations, most                 recent 06/10/2020. Risk Factors:Hypertension and Diabetes.  Sonographer:    Cammy Brochure Referring Phys: [place]  1. Left ventricular ejection fraction, by estimation, is 55 to 60%. The left ventricle has normal function. The left ventricle has no regional wall motion abnormalities. Left ventricular diastolic parameters are consistent with Grade II diastolic dysfunction (pseudonormalization).  2. Right ventricular systolic function is normal. The right ventricular size is normal.  3. Left atrial size was mildly dilated.  4. A small pericardial effusion is present.  5. The mitral valve is normal in structure. No evidence of mitral valve regurgitation.  6. Tricuspid valve regurgitation is mild to moderate.  7. The aortic valve is normal in structure. Aortic valve regurgitation is not visualized. FINDINGS  Left Ventricle: Left ventricular ejection fraction, by estimation, is 55 to 60%. The left ventricle has normal function. The left ventricle has no regional wall motion abnormalities. The left ventricular internal cavity size was normal in size. There is  no left ventricular hypertrophy. Left ventricular diastolic parameters are consistent with Grade II diastolic dysfunction (pseudonormalization). Right Ventricle: The right ventricular size is normal. No increase in right ventricular wall thickness. Right ventricular systolic function is normal. Left Atrium: Left atrial size was mildly dilated. Right Atrium: Right atrial size was normal in size. Pericardium: A small pericardial effusion is present. Mitral Valve: The mitral valve is normal in structure. No evidence of mitral valve regurgitation. MV peak gradient, 8.4 mmHg. The mean mitral valve gradient is 4.0 mmHg. Tricuspid Valve: The tricuspid valve is normal in structure. Tricuspid valve regurgitation is mild to moderate. Aortic Valve: The aortic valve is normal in structure. Aortic valve regurgitation is not visualized. Aortic valve mean gradient measures 7.0 mmHg. Aortic valve peak gradient measures 14.7 mmHg. Aortic valve  area, by VTI measures 1.30 cm. Pulmonic Valve: The pulmonic valve was normal in structure. Pulmonic valve regurgitation is trivial. Aorta: The aortic root and ascending aorta are structurally normal, with no evidence of dilitation. IAS/Shunts: The atrial septum is grossly normal.  LEFT VENTRICLE PLAX 2D LVIDd:         4.90 cm  Diastology LVIDs:         3.40 cm  LV e' medial:    5.44 cm/s LV PW:         1.40 cm  LV E/e' medial:  24.1 LV IVS:        1.00 cm  LV e' lateral:   8.05 cm/s LVOT diam:     1.80 cm  LV E/e' lateral: 16.3 LV SV:         54 LV SV Index:   28 LVOT Area:     2.54 cm  RIGHT VENTRICLE             IVC RV Basal diam:  3.80 cm     IVC diam: 1.20 cm RV S prime:     13.30 cm/s TAPSE (M-mode): 2.4 cm LEFT ATRIUM             Index       RIGHT ATRIUM           Index LA diam:        4.50 cm 2.34 cm/m  RA Area:     16.10 cm LA Vol (A2C):  67.2 ml 34.93 ml/m RA Volume:   41.70 ml  21.68 ml/m LA Vol (A4C):   65.9 ml 34.26 ml/m LA Biplane Vol: 72.4 ml 37.64 ml/m  AORTIC VALVE AV Area (Vmax):    1.22 cm AV Area (Vmean):   1.19 cm AV Area (VTI):     1.30 cm AV Vmax:           192.00 cm/s AV Vmean:          127.000 cm/s AV VTI:            0.419 m AV Peak Grad:      14.7 mmHg AV Mean Grad:      7.0 mmHg LVOT Vmax:         91.70 cm/s LVOT Vmean:        59.500 cm/s LVOT VTI:          0.214 m LVOT/AV VTI ratio: 0.51  AORTA Ao Root diam: 2.50 cm MITRAL VALVE                TRICUSPID VALVE MV Area (PHT): 4.60 cm     TR Peak grad:   52.4 mmHg MV Area VTI:   1.53 cm     TR Vmax:        362.00 cm/s MV Peak grad:  8.4 mmHg MV Mean grad:  4.0 mmHg     SHUNTS MV Vmax:       1.45 m/s     Systemic VTI:  0.21 m MV Vmean:      89.5 cm/s    Systemic Diam: 1.80 cm MV Decel Time: 165 msec MV E velocity: 131.00 cm/s MV A velocity: 102.00 cm/s MV E/A ratio:  1.28 Mertie Moores MD Electronically signed by Mertie Moores MD Signature Date/Time: 04/01/2021/3:33:24 PM    Final   Tyson Dense week]   Impression: Pleasant 50 year old  female with end-stage renal disease on hemodialysis, hypothyroidism, hypertension, type 2 diabetes mellitus, bilateral inner thigh wounds due to calciphylaxis presenting to the emergency department with bloody stools.  GI bleed: Dark blood with clots for 1 to 2 weeks off and on.  No associated rectal pain or abdominal pain.  Stool color is dark brown.  Hemoglobin on presentation 6.9, 3 weeks ago was 10.2.  Hemoglobin down to 6.1 today.  Platelets and INR normal.  Takes aspirin 325 mg daily but no other NSAIDs.  No prior EGD or colonoscopy.  No other GI symptoms.  Suspect lower GI bleed based on patient's description.  Less likely rapid transit upper GI bleed.  Patient has 1 unit of packed red blood cells ordered and currently transfusing.   Plan: 1. Transfuse packed red blood cells as needed.  She will will likely need 2 units. 2. Plan for colonoscopy with possible upper endoscopy tomorrow.  I have discussed the risks, alternatives, benefits with regards to but not limited to the risk of reaction to medication, bleeding, infection, perforation and the patient is agreeable to proceed. Written consent to be obtained. 3. Clear liquid diet today, n.p.o. after midnight. 4. Continue PPI IV twice daily.  We would like to thank you for the opportunity to participate in the care of Chenise Markwell.  Laureen Ochs. Bernarda Caffey [facility] [phone] 6/3/20229:08 AM     LOS: 0 days

## 2021-04-24 ENCOUNTER — Observation Stay (HOSPITAL_COMMUNITY): Payer: Medicaid - Out of State | Admitting: Anesthesiology

## 2021-04-24 ENCOUNTER — Encounter (HOSPITAL_COMMUNITY)
Admission: EM | Disposition: A | Discharge status: Home Health | Payer: Self-pay | Source: Home / Self Care | Attending: Family Medicine

## 2021-04-24 DIAGNOSIS — K644 Residual hemorrhoidal skin tags: Secondary | ICD-10-CM | POA: Diagnosis present

## 2021-04-24 DIAGNOSIS — K922 Gastrointestinal hemorrhage, unspecified: Secondary | ICD-10-CM | POA: Diagnosis present

## 2021-04-24 DIAGNOSIS — H409 Unspecified glaucoma: Secondary | ICD-10-CM | POA: Diagnosis present

## 2021-04-24 DIAGNOSIS — K625 Hemorrhage of anus and rectum: Secondary | ICD-10-CM

## 2021-04-24 DIAGNOSIS — H5461 Unqualified visual loss, right eye, normal vision left eye: Secondary | ICD-10-CM | POA: Diagnosis present

## 2021-04-24 DIAGNOSIS — I120 Hypertensive chronic kidney disease with stage 5 chronic kidney disease or end stage renal disease: Secondary | ICD-10-CM | POA: Diagnosis present

## 2021-04-24 DIAGNOSIS — K2970 Gastritis, unspecified, without bleeding: Secondary | ICD-10-CM | POA: Diagnosis present

## 2021-04-24 DIAGNOSIS — K269 Duodenal ulcer, unspecified as acute or chronic, without hemorrhage or perforation: Secondary | ICD-10-CM | POA: Diagnosis present

## 2021-04-24 DIAGNOSIS — E8889 Other specified metabolic disorders: Secondary | ICD-10-CM | POA: Diagnosis present

## 2021-04-24 DIAGNOSIS — Z992 Dependence on renal dialysis: Secondary | ICD-10-CM | POA: Diagnosis not present

## 2021-04-24 DIAGNOSIS — Z993 Dependence on wheelchair: Secondary | ICD-10-CM | POA: Diagnosis not present

## 2021-04-24 DIAGNOSIS — Z8249 Family history of ischemic heart disease and other diseases of the circulatory system: Secondary | ICD-10-CM | POA: Diagnosis not present

## 2021-04-24 DIAGNOSIS — E039 Hypothyroidism, unspecified: Secondary | ICD-10-CM | POA: Diagnosis present

## 2021-04-24 DIAGNOSIS — Z888 Allergy status to other drugs, medicaments and biological substances status: Secondary | ICD-10-CM | POA: Diagnosis not present

## 2021-04-24 DIAGNOSIS — Z794 Long term (current) use of insulin: Secondary | ICD-10-CM | POA: Diagnosis not present

## 2021-04-24 DIAGNOSIS — E785 Hyperlipidemia, unspecified: Secondary | ICD-10-CM | POA: Diagnosis present

## 2021-04-24 DIAGNOSIS — K635 Polyp of colon: Secondary | ICD-10-CM | POA: Diagnosis present

## 2021-04-24 DIAGNOSIS — D62 Acute posthemorrhagic anemia: Secondary | ICD-10-CM | POA: Diagnosis present

## 2021-04-24 DIAGNOSIS — N186 End stage renal disease: Secondary | ICD-10-CM | POA: Diagnosis present

## 2021-04-24 DIAGNOSIS — L89612 Pressure ulcer of right heel, stage 2: Secondary | ICD-10-CM | POA: Diagnosis present

## 2021-04-24 DIAGNOSIS — I959 Hypotension, unspecified: Secondary | ICD-10-CM | POA: Diagnosis present

## 2021-04-24 DIAGNOSIS — Z20822 Contact with and (suspected) exposure to covid-19: Secondary | ICD-10-CM | POA: Diagnosis present

## 2021-04-24 DIAGNOSIS — E1122 Type 2 diabetes mellitus with diabetic chronic kidney disease: Secondary | ICD-10-CM | POA: Diagnosis present

## 2021-04-24 DIAGNOSIS — E1165 Type 2 diabetes mellitus with hyperglycemia: Secondary | ICD-10-CM | POA: Diagnosis present

## 2021-04-24 HISTORY — PX: COLONOSCOPY: SHX5424

## 2021-04-24 HISTORY — PX: ESOPHAGOGASTRODUODENOSCOPY (EGD) WITH PROPOFOL: SHX5813

## 2021-04-24 HISTORY — PX: POLYPECTOMY: SHX5525

## 2021-04-24 HISTORY — PX: BIOPSY: SHX5522

## 2021-04-24 LAB — RENAL FUNCTION PANEL
Albumin: 2.7 g/dL — ABNORMAL LOW (ref 3.5–5.0)
Anion gap: 11 (ref 5–15)
BUN: 21 mg/dL — ABNORMAL HIGH (ref 6–20)
CO2: 28 mmol/L (ref 22–32)
Calcium: 8.2 mg/dL — ABNORMAL LOW (ref 8.9–10.3)
Chloride: 96 mmol/L — ABNORMAL LOW (ref 98–111)
Creatinine, Ser: 3.07 mg/dL — ABNORMAL HIGH (ref 0.44–1.00)
GFR, Estimated: 18 mL/min — ABNORMAL LOW (ref >60)
Glucose, Bld: 98 mg/dL (ref 70–99)
Phosphorus: 4.3 mg/dL (ref 2.5–4.6)
Potassium: 3.7 mmol/L (ref 3.5–5.1)
Sodium: 135 mmol/L (ref 135–145)

## 2021-04-24 LAB — CBC
HCT: 25.1 % — ABNORMAL LOW (ref 36.0–46.0)
Hemoglobin: 7.6 g/dL — ABNORMAL LOW (ref 12.0–15.0)
MCH: 27.4 pg (ref 26.0–34.0)
MCHC: 30.3 g/dL (ref 30.0–36.0)
MCV: 90.6 fL (ref 80.0–100.0)
Platelets: 207 10*3/uL (ref 150–400)
RBC: 2.77 MIL/uL — ABNORMAL LOW (ref 3.87–5.11)
RDW: 16.9 % — ABNORMAL HIGH (ref 11.5–15.5)
WBC: 5.9 10*3/uL (ref 4.0–10.5)
nRBC: 0 % (ref 0.0–0.2)

## 2021-04-24 LAB — GLUCOSE, CAPILLARY
Glucose-Capillary: 98 mg/dL (ref 70–99)
Glucose-Capillary: 87 mg/dL (ref 70–99)
Glucose-Capillary: 167 mg/dL — ABNORMAL HIGH (ref 70–99)
Glucose-Capillary: 201 mg/dL — ABNORMAL HIGH (ref 70–99)

## 2021-04-24 LAB — PREPARE RBC (CROSSMATCH)

## 2021-04-24 SURGERY — ESOPHAGOGASTRODUODENOSCOPY (EGD) WITH PROPOFOL
Anesthesia: General

## 2021-04-24 MED ORDER — PROPOFOL 10 MG/ML IV BOLUS
INTRAVENOUS | Status: DC | PRN
  Administered 2021-04-24 (×2): 200 mg via INTRAVENOUS

## 2021-04-24 MED ORDER — SODIUM CHLORIDE 0.9 % IV SOLN: INTRAVENOUS | Status: DC

## 2021-04-24 MED ORDER — PROPOFOL 10 MG/ML IV BOLUS
INTRAVENOUS | Status: AC
  Filled 2021-04-24: qty 40

## 2021-04-24 MED ORDER — LACTATED RINGERS IV SOLN: INTRAVENOUS | Status: DC | PRN

## 2021-04-24 MED ORDER — SODIUM CHLORIDE 0.9% IV SOLUTION: Freq: Once | INTRAVENOUS | Status: AC

## 2021-04-24 MED ORDER — LACTATED RINGERS IV SOLN: INTRAVENOUS | Status: DC

## 2021-04-24 MED ORDER — STERILE WATER FOR IRRIGATION IR SOLN
Status: DC | PRN
  Administered 2021-04-24: 100 mL

## 2021-04-24 NOTE — Op Note (Signed)
[facility] Patient Name: Amy Moses Procedure Date: 04/24/2021 9:13 AM MRN: [id number] Date of Birth: 1971-07-01 Attending MD: Elon Alas. Abbey Chatters DO CSN: [id number] Age: 50 Admit Type: Outpatient Procedure:                Colonoscopy Indications:              Rectal bleeding, Acute post hemorrhagic anemia Providers:                Elon Alas. Abbey Chatters, DO, Tammy Vaught, RN, Wynonia Musty Tech, Technician Referring MD:              Medicines:                See the Anesthesia note for documentation of the                            administered medications Complications:            No immediate complications. Estimated Blood Loss:     Estimated blood loss: none. Procedure:                Pre-Anesthesia Assessment:                           - The anesthesia plan was to use monitored                            anesthesia care (MAC).                           After obtaining informed consent, the colonoscope                            was passed under direct vision. Throughout the                            procedure, the patient's blood pressure, pulse, and                            oxygen saturations were monitored continuously. The                            PCF-H190DL ([id number]) scope was introduced through                            the anus and advanced to the the cecum, identified                            by appendiceal orifice and ileocecal valve. The                            colonoscopy was performed without difficulty. The                            patient tolerated the procedure well.  The quality                            of the bowel preparation was evaluated using the                            BBPS [facility] Bowel Preparation Scale) with scores                            of: Right Colon = 2 (minor amount of residual                            staining, small fragments of stool and/or opaque                            liquid, but  mucosa seen well), Transverse Colon = 2                            (minor amount of residual staining, small fragments                            of stool and/or opaque liquid, but mucosa seen                            well) and Left Colon = 2 (minor amount of residual                            staining, small fragments of stool and/or opaque                            liquid, but mucosa seen well). The total BBPS score                            equals 6. The quality of the bowel preparation was                            fair. Scope In: 9:17:44 AM Scope Out: 9:34:18 AM Scope Withdrawal Time: 0 hours 12 minutes 2 seconds  Total Procedure Duration: 0 hours 16 minutes 34 seconds  Findings:      Skin tags were found on perianal exam.      Non-bleeding internal hemorrhoids were found during endoscopy.      A 25 mm polyp was found in the transverse colon. The polyp was       pedunculated. Polyp with attached clot and stigmata of recent bleeding.       The polyp was removed with a hot snare. Resection and retrieval were       complete with Jabier Mutton net. Impression:               - Preparation of the colon was fair.                           - Perianal skin tags found on perianal exam.                           -  Non-bleeding internal hemorrhoids.                           - One 25 mm polyp in the transverse colon, removed                            with a hot snare. Resected and retrieved. Moderate Sedation:      Per Anesthesia Care Recommendation:           - Return patient to hospital ward for ongoing care.                           - Renal diet.                           - Repeat colonoscopy 1-3 years for surveillance.                           - Large polyp with clot attached to it and stigmata                            of recent bleeding. This is likely the culprit of                            patient's worsening anemia. I did remove it                            successfully. Okay to  start patient on diet.                            Surveillance colonoscopy to be arranged after                            pathology reviewed. Procedure Code(s):        --- Professional ---                           [phone], Colonoscopy, flexible; with removal of                            tumor(s), polyp(s), or other lesion(s) by snare                            technique Diagnosis Code(s):        --- Professional ---                           K64.8, Other hemorrhoids                           K63.5, Polyp of colon                           K64.4, Residual hemorrhoidal skin tags  K62.5, Hemorrhage of anus and rectum                           D62, Acute posthemorrhagic anemia CPT copyright 2019 American Medical Association. All rights reserved. The codes documented in this report are preliminary and upon coder review may  be revised to meet current compliance requirements. Elon Alas. Abbey Chatters, DO [place] Abbey Chatters, DO 04/24/2021 9:52:26 AM This report has been signed electronically. Number of Addenda: 0

## 2021-04-24 NOTE — Interval H&P Note (Signed)
History and Physical Interval Note:  04/24/2021 8:42 AM  Amy Moses  has presented today for surgery, with the diagnosis of bloody stool, acute on chronic anemia.  The various methods of treatment have been discussed with the patient and family. After consideration of risks, benefits and other options for treatment, the patient has consented to  Procedure(s): ESOPHAGOGASTRODUODENOSCOPY (EGD) WITH PROPOFOL (N/A) COLONOSCOPY as a surgical intervention.  The patient's history has been reviewed, patient examined, no change in status, stable for surgery.  I have reviewed the patient's chart and labs.  Questions were answered to the patient's satisfaction.     Eloise Harman

## 2021-04-24 NOTE — Procedures (Signed)
   HEMODIALYSIS TREATMENT NOTE:  Uneventful 3 hour heparin-free HD completed via LUE AVF (15g/antegrade). Hemodynamically stable throughout session. One unit pRBC transfused.  Fluid balance this treatment:  NS  +350 Meds  +200 pRBC  +315 UF removed -2900 ______________________ Net UF 2035 cc  All blood was returned and hemostasis was achieved in 15 minutes.  No changes from pre-HD assessment.  Rockwell Alexandria, RN

## 2021-04-24 NOTE — Transfer of Care (Signed)
Immediate Anesthesia Transfer of Care Note  Patient: [name], clinical  Procedure(s) Performed: ESOPHAGOGASTRODUODENOSCOPY (EGD) WITH PROPOFOL (N/A ) COLONOSCOPY BIOPSY POLYPECTOMY  Patient Location: PACU  Anesthesia Type:General  Level of Consciousness: awake and alert   Airway & Oxygen Therapy: Patient Spontanous Breathing  Post-op Assessment: Report given to RN and Post -op Vital signs reviewed and stable  Post vital signs: Reviewed and stable  Last Vitals:  Vitals Value Taken Time  BP    Temp    Pulse    Resp    SpO2      Last Pain:  Vitals:   04/24/21 0900  TempSrc:   PainSc: 0-No pain         Complications: No complications documented.

## 2021-04-24 NOTE — Anesthesia Preprocedure Evaluation (Signed)
Anesthesia Evaluation  Patient identified by MRN, date of birth, ID band Patient awake    Reviewed: Allergy & Precautions, H&P , NPO status , Patient's Chart, lab work & pertinent test results, reviewed documented beta blocker date and time   Airway Mallampati: II  TM Distance: >3 FB Neck ROM: full    Dental no notable dental hx.    Pulmonary neg pulmonary ROS,    Pulmonary exam normal breath sounds clear to auscultation       Cardiovascular Exercise Tolerance: Good hypertension, +CHF   Rhythm:regular Rate:Normal     Neuro/Psych negative neurological ROS  negative psych ROS   GI/Hepatic negative GI ROS, Neg liver ROS,   Endo/Other  diabetesHypothyroidism   Renal/GU ESRFRenal disease  negative genitourinary   Musculoskeletal   Abdominal   Peds  Hematology  (+) Blood dyscrasia, anemia ,   Anesthesia Other Findings 1. Left ventricular ejection fraction, by estimation, is 55 to 60%. The left ventricle has normal function. The left ventricle has no regional wall motion abnormalities. Left ventricular diastolic parameters are consistent with Grade II diastolic dysfunction (pseudonormalization). 2. Right ventricular systolic function is normal. The right ventricular size is normal. 3. Left atrial size was mildly dilated. 4. A small pericardial effusion is present. 5. The mitral valve is normal in structure. No evidence of mitral valve regurgitation. 6. Tricuspid valve regurgitation is mild to moderate. 7. The aortic valve is normal in structure. Aortic valve regurgitation is not visualized.  Reproductive/Obstetrics negative OB ROS                             Anesthesia Physical Anesthesia Plan  ASA: III  Anesthesia Plan: General   Post-op Pain Management:    Induction:   PONV Risk Score and Plan: Propofol infusion  Airway Management Planned:   Additional Equipment:   Intra-op  Plan:   Post-operative Plan:   Informed Consent: I have reviewed the patients History and Physical, chart, labs and discussed the procedure including the risks, benefits and alternatives for the proposed anesthesia with the patient or authorized representative who has indicated his/her understanding and acceptance.     Dental Advisory Given  Plan Discussed with: CRNA  Anesthesia Plan Comments:         Anesthesia Quick Evaluation

## 2021-04-24 NOTE — Op Note (Signed)
[facility] Patient Name: Amy Moses Procedure Date: 04/24/2021 8:58 AM MRN: [id number] Date of Birth: 1971/05/22 Attending MD: Elon Alas. Abbey Chatters DO CSN: [id number] Age: 50 Admit Type: Inpatient Procedure:                Upper GI endoscopy Indications:              Acute post hemorrhagic anemia Providers:                Elon Alas. Abbey Chatters, DO, Tammy Vaught, RN, Wynonia Musty Tech, Technician Referring MD:              Medicines:                See the Anesthesia note for documentation of the                            administered medications Complications:            No immediate complications. Estimated Blood Loss:     Estimated blood loss was minimal. Procedure:                Pre-Anesthesia Assessment:                           - The anesthesia plan was to use monitored                            anesthesia care (MAC).                           After obtaining informed consent, the endoscope was                            passed under direct vision. Throughout the                            procedure, the patient's blood pressure, pulse, and                            oxygen saturations were monitored continuously. The                            [id number]) was introduced through the mouth,                            and advanced to the second part of duodenum. The                            upper GI endoscopy was accomplished without                            difficulty. The patient tolerated the procedure                            well. Scope In: 9:07:44 AM  Scope Out: 9:12:20 AM Total Procedure Duration: 0 hours 4 minutes 36 seconds  Findings:      There is no endoscopic evidence of bleeding, areas of erosion,       esophagitis, hiatal hernia, ulcerations or varices in the entire       esophagus.      Diffuse moderate inflammation characterized by erythema was found in the       entire examined stomach. Biopsies were taken  with a cold forceps for       Helicobacter pylori testing.      Multiple localized erosions without bleeding were found in the duodenal       bulb and in the first portion of the duodenum. Biopsies were taken with       a cold forceps for histology. Impression:               - Gastritis. Biopsied.                           - Duodenal erosions without bleeding. Biopsied. Moderate Sedation:      Per Anesthesia Care Recommendation:           - Return patient to hospital ward for ongoing care.                           - Renal diet.                           - Use a proton pump inhibitor PO BID for 12 weeks                            then decrease down to once daily.                           - Multiple erosions in 1st portion of dudoenum                            which could have been oozing.                           - No active bleeding identified on upper endosocpy.                            Proceed with colonoscopy. Procedure Code(s):        --- Professional ---                           [phone], Esophagogastroduodenoscopy, flexible,                            transoral; with biopsy, single or multiple Diagnosis Code(s):        --- Professional ---                           K29.70, Gastritis, unspecified, without bleeding                           K26.9, Duodenal ulcer, unspecified as acute or  chronic, without hemorrhage or perforation                           D62, Acute posthemorrhagic anemia CPT copyright 2019 American Medical Association. All rights reserved. The codes documented in this report are preliminary and upon coder review may  be revised to meet current compliance requirements. Elon Alas. Abbey Chatters, DO Cyrus Abbey Chatters, DO 04/24/2021 9:16:57 AM This report has been signed electronically. Number of Addenda: 0

## 2021-04-24 NOTE — Progress Notes (Addendum)
Patient Demographics:    Amy Moses, is a 50 y.o. female, DOB - 03-22-1971, [id number]  Admit date - 04/22/2021   Admitting Physician Bernadette Hoit, DO  Outpatient Primary MD for the patient is Leeanne Rio, MD  LOS - 0   Chief Complaint  Patient presents with  . Blood In Stools        Subjective:    Amy Moses today has no fevers, no further emesis,  No chest pain,   -- Resting comfortably after EGD and colonoscopy  Assessment  & Plan :    Principal Problem:   GI bleed Active Problems:   Uncontrolled type 2 diabetes mellitus with chronic kidney disease, with long-term current use of insulin (HCC)   Hyperlipidemia   End stage renal disease (HCC)   Non-healing open wound of heel   Calciphylaxis   Acute blood loss anemia  Brief Summary:- 50 y.o. female with medical history significant for ESRD on HD (TTS), T2DM, calciphylaxis with wound VAC, right eye blindness, hypothyroidism, hypertension, hyperlipidemia admitted on 04/22/2021 with concerns for GI bleed and drop in hemoglobin  A/p 1)Acute on chronic anemia due to acute blood loss/Gi Bleed- Patient received 1 unit of PRBCs Hgb 7.6 from 6.1  -EPO agent per nephrology team -GI consult appreciated  -Colonoscopy on 04/24/2021 with Large polyp with clot attached to it and stigmata of recent bleeding. This is likely the culprit of patient's worsening anemia. EGD on 04/24/2021 with Gastritis and Duodenal erosions w/o active bleeding -PPI twice daily for 12 weeks advised -Okay to transfuse additional unit of PRBC with hemodialysis on 04/24/2021  2)ESRD--usually TTS, missed hemodialysis on 04/22/2021 -Patient requiring serial/back-to-back HD session on 04/23/2021 and 04/24/2021 with transfusion of PRBC each day  3)Calciphylaxis with wound VAC on bilateral thighs----wound VAC changed  4)DM2- -resume Lantus insulin  Use Novolog/Humalog  Sliding scale insulin with Accu-Cheks/Fingersticks as ordered   5)Hypothyroidism--- continue levothyroxine  Disposition/Need for in-Hospital Stay- patient unable to be discharged at this time due to -presumed GI bleed with symptomatic anemia requiring transfusion of PRBC with hemodialysis --Patient requiring serial/back-to-back HD session on 04/23/2021 and 04/24/2021 with transfusion of PRBC each day -Possible discharge home on 04/25/2021 if stable  Disposition: The patient is from: Home              Anticipated d/c is to: Home              Anticipated d/c date is: 1 day              Patient currently is not medically stable to d/c. Barriers: Not Clinically Stable-   Code Status :  -  Code Status: Full Code   Family Communication:    NA (patient is alert, awake and coherent)   Consults  :  Gi/Nephrology  DVT Prophylaxis  :   - SCDs  SCDs Start: 04/23/21 0346    Lab Results  Component Value Date   PLT 207 04/24/2021    Inpatient Medications  Scheduled Meds: . sodium chloride   Intravenous Once  . Chlorhexidine Gluconate Cloth  6 each Topical Q0600  . Chlorhexidine Gluconate Cloth  6 each Topical Q0600  . insulin aspart  0-5 Units Subcutaneous QHS  . insulin aspart  0-6  Units Subcutaneous TID WC  . insulin glargine  10 Units Subcutaneous QHS  . levothyroxine  50 mcg Oral Q0600  . pantoprazole (PROTONIX) IV  40 mg Intravenous Q12H  . sevelamer carbonate  800 mg Oral TID WC   Continuous Infusions: . sodium chloride    . sodium chloride    . sodium thiosulfate infusion for calciphylaxis     PRN Meds:.sodium chloride, sodium chloride, lidocaine (PF), lidocaine-prilocaine, pentafluoroprop-tetrafluoroeth    Anti-infectives (From admission, onward)   None        Objective:   Vitals:   04/24/21 0943 04/24/21 0945 04/24/21 0951 04/24/21 0957  BP:  90/60  (!) 101/57  Pulse:  62  68  Resp:  17  18  Temp:      TempSrc:      SpO2: 96% 100% 100% 100%  Height:        Wt  Readings from Last 3 Encounters:  04/01/21 79.5 kg  12/18/20 106.6 kg  11/19/20 104.3 kg     Intake/Output Summary (Last 24 hours) at 04/24/2021 1414 Last data filed at 04/24/2021 1300 Gross per 24 hour  Intake 1330 ml  Output 3001 ml  Net -1671 ml   Physical Exam  Gen:- Awake Alert,  In no apparent distress  HEENT:- [place].AT, right eye vision loss Neck-Supple Neck,No JVD,.  Lungs-  CTAB , fair symmetrical air movement CV- S1, S2 normal, regular , 2/6 SM Abd-  +ve B.Sounds, Abd Soft, No tenderness,    Extremity/Skin:- No  edema, pedal pulses present  Psych-affect is appropriate, oriented x3 Neuro-no new focal deficits, no tremors MSK-Lt UE- AV fistula with thrill and bruit, bilateral thigh with wound vacs   Data Review:   Micro Results Recent Results (from the past 240 hour(s))  SARS CORONAVIRUS 2 (TAT 6-24 HRS) Nasopharyngeal Nasopharyngeal Swab     Status: None   Collection Time: 04/22/21 11:56 PM   Specimen: Nasopharyngeal Swab  Result Value Ref Range Status   SARS Coronavirus 2 NEGATIVE NEGATIVE Final    Comment: (NOTE) SARS-CoV-2 target nucleic acids are NOT DETECTED.  The SARS-CoV-2 RNA is generally detectable in upper and lower respiratory specimens during the acute phase of infection. Negative results do not preclude SARS-CoV-2 infection, do not rule out co-infections with other pathogens, and should not be used as the sole basis for treatment or other patient management decisions. Negative results must be combined with clinical observations, patient history, and epidemiological information. The expected result is Negative.  Fact Sheet for Patients: SugarRoll.be  Fact Sheet for Healthcare Providers: https://www.woods-mathews.com/  This test is not yet approved or cleared by the [place] FDA and  has been authorized for detection and/or diagnosis of SARS-CoV-2 by FDA under an Emergency Use Authorization (EUA). This  EUA will remain  in effect (meaning this test can be used) for the duration of the COVID-19 declaration under Se ction 564(b)(1) of the Act, 21 U.S.C. section 360bbb-3(b)(1), unless the authorization is terminated or revoked sooner.  Performed at [facility], [place] [address] [place], [place] [address]     Radiology Reports ECHOCARDIOGRAM COMPLETE  Result Date: 04/01/2021    ECHOCARDIOGRAM REPORT   Patient Name:   ALEA RODES Date of Exam: 04/01/2021 Medical Rec #:  [id number]       Height:       66.0 in Accession #:    [id number]      Weight:       182.5 lb Date of Birth:  1970/11/30  BSA:          1.924 m Patient Age:    85 years        BP:           125/70 mmHg Patient Gender: F               HR:           67 bpm. Exam Location:  Inpatient Procedure: 2D Echo, Cardiac Doppler and Color Doppler Indications:    CHF  History:        Patient has prior history of Echocardiogram examinations, most                 recent 06/10/2020. Risk Factors:Hypertension and Diabetes.  Sonographer:    Cammy Brochure Referring Phys: [place]  1. Left ventricular ejection fraction, by estimation, is 55 to 60%. The left ventricle has normal function. The left ventricle has no regional wall motion abnormalities. Left ventricular diastolic parameters are consistent with Grade II diastolic dysfunction (pseudonormalization).  2. Right ventricular systolic function is normal. The right ventricular size is normal.  3. Left atrial size was mildly dilated.  4. A small pericardial effusion is present.  5. The mitral valve is normal in structure. No evidence of mitral valve regurgitation.  6. Tricuspid valve regurgitation is mild to moderate.  7. The aortic valve is normal in structure. Aortic valve regurgitation is not visualized. FINDINGS  Left Ventricle: Left ventricular ejection fraction, by estimation, is 55 to 60%. The left ventricle has normal function. The left ventricle has no regional  wall motion abnormalities. The left ventricular internal cavity size was normal in size. There is  no left ventricular hypertrophy. Left ventricular diastolic parameters are consistent with Grade II diastolic dysfunction (pseudonormalization). Right Ventricle: The right ventricular size is normal. No increase in right ventricular wall thickness. Right ventricular systolic function is normal. Left Atrium: Left atrial size was mildly dilated. Right Atrium: Right atrial size was normal in size. Pericardium: A small pericardial effusion is present. Mitral Valve: The mitral valve is normal in structure. No evidence of mitral valve regurgitation. MV peak gradient, 8.4 mmHg. The mean mitral valve gradient is 4.0 mmHg. Tricuspid Valve: The tricuspid valve is normal in structure. Tricuspid valve regurgitation is mild to moderate. Aortic Valve: The aortic valve is normal in structure. Aortic valve regurgitation is not visualized. Aortic valve mean gradient measures 7.0 mmHg. Aortic valve peak gradient measures 14.7 mmHg. Aortic valve area, by VTI measures 1.30 cm. Pulmonic Valve: The pulmonic valve was normal in structure. Pulmonic valve regurgitation is trivial. Aorta: The aortic root and ascending aorta are structurally normal, with no evidence of dilitation. IAS/Shunts: The atrial septum is grossly normal.  LEFT VENTRICLE PLAX 2D LVIDd:         4.90 cm  Diastology LVIDs:         3.40 cm  LV e' medial:    5.44 cm/s LV PW:         1.40 cm  LV E/e' medial:  24.1 LV IVS:        1.00 cm  LV e' lateral:   8.05 cm/s LVOT diam:     1.80 cm  LV E/e' lateral: 16.3 LV SV:         54 LV SV Index:   28 LVOT Area:     2.54 cm  RIGHT VENTRICLE             IVC RV Basal  diam:  3.80 cm     IVC diam: 1.20 cm RV S prime:     13.30 cm/s TAPSE (M-mode): 2.4 cm LEFT ATRIUM             Index       RIGHT ATRIUM           Index LA diam:        4.50 cm 2.34 cm/m  RA Area:     16.10 cm LA Vol (A2C):   67.2 ml 34.93 ml/m RA Volume:   41.70 ml   21.68 ml/m LA Vol (A4C):   65.9 ml 34.26 ml/m LA Biplane Vol: 72.4 ml 37.64 ml/m  AORTIC VALVE AV Area (Vmax):    1.22 cm AV Area (Vmean):   1.19 cm AV Area (VTI):     1.30 cm AV Vmax:           192.00 cm/s AV Vmean:          127.000 cm/s AV VTI:            0.419 m AV Peak Grad:      14.7 mmHg AV Mean Grad:      7.0 mmHg LVOT Vmax:         91.70 cm/s LVOT Vmean:        59.500 cm/s LVOT VTI:          0.214 m LVOT/AV VTI ratio: 0.51  AORTA Ao Root diam: 2.50 cm MITRAL VALVE                TRICUSPID VALVE MV Area (PHT): 4.60 cm     TR Peak grad:   52.4 mmHg MV Area VTI:   1.53 cm     TR Vmax:        362.00 cm/s MV Peak grad:  8.4 mmHg MV Mean grad:  4.0 mmHg     SHUNTS MV Vmax:       1.45 m/s     Systemic VTI:  0.21 m MV Vmean:      89.5 cm/s    Systemic Diam: 1.80 cm MV Decel Time: 165 msec MV E velocity: 131.00 cm/s MV A velocity: 102.00 cm/s MV E/A ratio:  1.28 Mertie Moores MD Electronically signed by Mertie Moores MD Signature Date/Time: 04/01/2021/3:33:24 PM    Final      CBC Recent Labs  Lab 04/22/21 2134 04/23/21 0626 04/23/21 1216 04/24/21 0634  WBC 6.8 6.2  --  5.9  HGB 6.9* 6.1* 7.2* 7.6*  HCT 23.6* 21.2* 23.7* 25.1*  PLT 221 198  --  207  MCV 92.2 93.0  --  90.6  MCH 27.0 26.8  --  27.4  MCHC 29.2* 28.8*  --  30.3  RDW 17.2* 17.2*  --  16.9*  LYMPHSABS 1.2  --   --   --   MONOABS 0.8  --   --   --   EOSABS 0.3  --   --   --   BASOSABS 0.0  --   --   --     Chemistries  Recent Labs  Lab 04/22/21 2134 04/23/21 0626 04/24/21 0634  NA 138 139 135  K 5.0 4.7 3.7  CL 96* 98 96*  CO2 '25 23 28  '$ GLUCOSE 230* 120* 98  BUN 43* 46* 21*  CREATININE 4.57* 4.86* 3.07*  CALCIUM 9.5 9.3 8.2*  MG  --  2.3  --   AST 11* 12*  --   ALT 13 12  --  ALKPHOS 146* 119  --   BILITOT 0.6 0.5  --    ------------------------------------------------------------------------------------------------------------------ No results for input(s): CHOL, HDL, LDLCALC, TRIG, CHOLHDL, LDLDIRECT in  the last 72 hours.  Lab Results  Component Value Date   HGBA1C 7.3 (H) 03/30/2021   ------------------------------------------------------------------------------------------------------------------ No results for input(s): TSH, T4TOTAL, T3FREE, THYROIDAB in the last 72 hours.  Invalid input(s): FREET3 ------------------------------------------------------------------------------------------------------------------ No results for input(s): VITAMINB12, FOLATE, FERRITIN, TIBC, IRON, RETICCTPCT in the last 72 hours.  Coagulation profile Recent Labs  Lab 04/22/21 2134 04/23/21 0626  INR 1.1 1.2    No results for input(s): DDIMER in the last 72 hours.  Cardiac Enzymes No results for input(s): CKMB, TROPONINI, MYOGLOBIN in the last 168 hours.  Invalid input(s): CK ------------------------------------------------------------------------------------------------------------------    Component Value Date/Time   BNP 1,068.9 (H) 03/30/2021 1658     Roxan Hockey M.D on 04/24/2021 at 2:14 PM  Go to [email or web address] - for contact info  [facility] - Office  [phone]

## 2021-04-24 NOTE — Anesthesia Postprocedure Evaluation (Signed)
Anesthesia Post Note  Patient: [name], clinical  Procedure(s) Performed: ESOPHAGOGASTRODUODENOSCOPY (EGD) WITH PROPOFOL (N/A ) COLONOSCOPY BIOPSY POLYPECTOMY  Patient location during evaluation: PACU Anesthesia Type: General Level of consciousness: awake and alert Pain management: pain level controlled Vital Signs Assessment: post-procedure vital signs reviewed and stable Respiratory status: spontaneous breathing, nonlabored ventilation, respiratory function stable and patient connected to nasal cannula oxygen Cardiovascular status: blood pressure returned to baseline and stable Postop Assessment: no apparent nausea or vomiting Anesthetic complications: no   No complications documented.   Last Vitals:  Vitals:   04/23/21 2030 04/23/21 2100  BP: 126/68 134/70  Pulse: 71 70  Resp:  18  Temp:  36.9 C  SpO2:      Last Pain:  Vitals:   04/24/21 0900  TempSrc:   PainSc: 0-No pain                 Louann Sjogren

## 2021-04-25 LAB — CBC
HCT: 26.8 % — ABNORMAL LOW (ref 36.0–46.0)
Hemoglobin: 8.1 g/dL — ABNORMAL LOW (ref 12.0–15.0)
MCH: 27.5 pg (ref 26.0–34.0)
MCHC: 30.2 g/dL (ref 30.0–36.0)
MCV: 90.8 fL (ref 80.0–100.0)
Platelets: 175 10*3/uL (ref 150–400)
RBC: 2.95 MIL/uL — ABNORMAL LOW (ref 3.87–5.11)
RDW: 16.2 % — ABNORMAL HIGH (ref 11.5–15.5)
WBC: 6.5 10*3/uL (ref 4.0–10.5)
nRBC: 0 % (ref 0.0–0.2)

## 2021-04-25 LAB — GLUCOSE, CAPILLARY
Glucose-Capillary: 144 mg/dL — ABNORMAL HIGH (ref 70–99)
Glucose-Capillary: 228 mg/dL — ABNORMAL HIGH (ref 70–99)

## 2021-04-25 MED ORDER — ASPIRIN 325 MG PO TBEC: 325.0000 mg | DELAYED_RELEASE_TABLET | Freq: Every day | ORAL | 3 refills | Status: DC

## 2021-04-25 MED ORDER — SODIUM BICARBONATE 650 MG PO TABS: 650.0000 mg | ORAL_TABLET | Freq: Two times a day (BID) | ORAL | 4 refills | Status: DC

## 2021-04-25 MED ORDER — SEVELAMER CARBONATE 800 MG PO TABS: 800.0000 mg | ORAL_TABLET | Freq: Three times a day (TID) | ORAL | 3 refills | Status: DC

## 2021-04-25 MED ORDER — LEVOTHYROXINE SODIUM 50 MCG PO TABS: 50.0000 ug | ORAL_TABLET | Freq: Every day | ORAL | 5 refills | Status: DC

## 2021-04-25 MED ORDER — ATORVASTATIN CALCIUM 10 MG PO TABS: 10.0000 mg | ORAL_TABLET | Freq: Every day | ORAL | 5 refills | Status: DC

## 2021-04-25 MED ORDER — ASPIRIN EC 81 MG PO TBEC: 81.0000 mg | DELAYED_RELEASE_TABLET | Freq: Every day | ORAL | 2 refills | Status: DC

## 2021-04-25 MED ORDER — METOPROLOL SUCCINATE ER 50 MG PO TB24: 50.0000 mg | ORAL_TABLET | Freq: Every day | ORAL | 5 refills | Status: DC

## 2021-04-25 MED ORDER — PANTOPRAZOLE SODIUM 40 MG PO TBEC: 40.0000 mg | DELAYED_RELEASE_TABLET | Freq: Two times a day (BID) | ORAL | 5 refills | Status: DC

## 2021-04-25 NOTE — Progress Notes (Signed)
Subjective: Patient without complaints this AM.  No further GI bleeding.  Hemoglobin stable at 8.1.  Tolerating diet without problems.  Objective: Vital signs in last 24 hours: Temp:  [97.9 F (36.6 C)-98.4 F (36.9 C)] 98.4 F (36.9 C) (06/05 0605) Pulse Rate:  [65-75] 65 (06/05 0605) Resp:  [16-18] 18 (06/05 0605) BP: (101-130)/(52-73) 122/62 (06/05 0605) SpO2:  [99 %-100 %] 99 % (06/04 1600) Last BM Date: 04/23/21 General:   Alert and oriented, pleasant Head:  Normocephalic and atraumatic. Eyes:  No icterus, sclera clear. Conjuctiva pink.  Mouth:  Without lesions, mucosa pink and moist.  Neck:  Supple, without thyromegaly or masses.  Abdomen:  Bowel sounds present, soft, non-tender, non-distended. No HSM or hernias noted. No rebound or guarding. No masses appreciated  Msk:  Symmetrical without gross deformities. Normal posture. Pulses:  Normal pulses noted. Extremities:  Without clubbing or edema. Neurologic:  Alert and  oriented x4;  grossly normal neurologically. Skin:  Warm and dry, intact without significant lesions.  Cervical Nodes:  No significant cervical adenopathy. Psych:  Alert and cooperative. Normal mood and affect.  Intake/Output from previous day: 06/04 0701 - 06/05 0700 In: 1668 [P.O.:953; I.V.:200; Blood:315; IV Piggyback:200] Out: 2235 [Urine:200] Intake/Output this shift: No intake/output data recorded.  Lab Results: Recent Labs    04/23/21 0626 04/23/21 1216 04/24/21 0634 04/25/21 0439  WBC 6.2  --  5.9 6.5  HGB 6.1* 7.2* 7.6* 8.1*  HCT 21.2* 23.7* 25.1* 26.8*  PLT 198  --  207 175   BMET Recent Labs    04/22/21 2134 04/23/21 0626 04/24/21 0634  NA 138 139 135  K 5.0 4.7 3.7  CL 96* 98 96*  CO2 '25 23 28  '$ GLUCOSE 230* 120* 98  BUN 43* 46* 21*  CREATININE 4.57* 4.86* 3.07*  CALCIUM 9.5 9.3 8.2*   LFT Recent Labs    04/22/21 2134 04/23/21 0626 04/24/21 0634  PROT 6.6 5.9*  --   ALBUMIN 3.1* 2.8* 2.7*  AST 11* 12*  --   ALT 13  12  --   ALKPHOS 146* 119  --   BILITOT 0.6 0.5  --    PT/INR Recent Labs    04/22/21 2134 04/23/21 0626  LABPROT 13.7 14.9  INR 1.1 1.2   Hepatitis Panel No results for input(s): HEPBSAG, HCVAB, HEPAIGM, HEPBIGM in the last 72 hours.   Studies/Results: No results found.  Assessment: *Acute GI bleed *Acute blood loss anemia due to above  Plan: Patient underwent EGD and colonoscopy yesterday.  EGD with duodenal erosions and gastritis status post multiple biopsies.   Colonoscopy with 1 large pedunculated transverse polyp with adherent clot and stigmata of recent bleeding which was removed with hot snare, likely cause of patient's acute GI bleed.  Hemoglobin stable today.  Patient clinically doing well.  Okay to discharge from a GI standpoint.  Recommend twice daily PPI for 8 to 12 weeks and decrease to once daily thereafter.  I will follow-up on biopsy results and treat for H. pylori if positive.  I will also follow-up on polyp pathology and determine surveillance colonoscopy at that time.  Patient told my office will contact her this week when we have these results.  GI to sign off, please call with any questions concerns  Elon Alas. Abbey Chatters, D.O. Gastroenterology and Hepatology [name]   LOS: 1 day    04/25/2021, 10:35 AM

## 2021-04-25 NOTE — TOC Transition Note (Signed)
Transition of Care [facility]) - CM/SW Discharge Note   Patient Details  Name: Amy Moses MRN: [id number] Date of Birth: 20-Jul-1971  Transition of Care [facility]) CM/SW Contact:  Natasha Bence, LCSW Phone Number: 04/25/2021, 2:57 PM   Clinical Narrative:    Patient reported that she had [place] with [facility]. CSW contacted [facility]. [facility] rep agreeable to resume services upon discharge. CSW faxed orders to provided number. [phone]. TOC signing off.    Final next level of care: [place] Barriers to Discharge: Barriers Resolved   Patient Goals and CMS Choice Patient states their goals for this hospitalization and ongoing recovery are:: Return home with [facility] CMS Medicare.gov Compare Post Acute Care list provided to:: Patient Choice offered to / list presented to : Patient  Discharge Placement                    Patient and family notified of of transfer: 04/25/21  Discharge Plan and Services                          HH Arranged: RN [facility] Agency: [facility] Date [place]: 04/25/21 Time [place]: [id number] Representative spoke with at [place]: Rep  Social Determinants of Health ([place]) Interventions     Readmission Risk Interventions No flowsheet data found.

## 2021-04-25 NOTE — Discharge Instructions (Signed)
1)Avoid ibuprofen/Advil/Aleve/Motrin/Goody Powders/Naproxen/BC powders/Meloxicam/Diclofenac/Indomethacin and other Nonsteroidal anti-inflammatory medications as these will make you more likely to bleed and can cause stomach ulcers, can also cause Kidney problems.   2)Please talk to your Nephrologist about Increasing your dose of Aranesp or Procrit or Mircera due to your low blood count requiring Blood Transfusion---   3)Very low-salt diet advised  4)Weigh yourself daily, call if you gain more than 3 pounds in 1 day or more than 5 pounds in 1 week as your hemodialysis schedule and dry weight may need to be adjusted   5)Limit your Fluid  intake to no more than 50 ounces (1.5 Liters) per day  6)Please Take Protonix 40 mg twice a day with food to reduce chance of Bleeding  7)Please continue hemodialysis on Tuesdays, Thursdays and Saturdays  8) continue wound VAC changes on Mondays Wednesdays and Fridays  9)Repeat BMP and CBC blood test check with your nephrologist on Tuesday, 04/27/2021

## 2021-04-25 NOTE — Discharge Summary (Signed)
Amy Moses, is a 50 y.o. female  DOB Oct 24, 1971  MRN [id number].  Admission date:  04/22/2021  Admitting Physician  Roxan Hockey, MD  Discharge Date:  04/25/2021   Primary MD  Leeanne Rio, MD  Recommendations for primary care physician for things to follow:   1)Avoid ibuprofen/Advil/Aleve/Motrin/Goody Powders/Naproxen/BC powders/Meloxicam/Diclofenac/Indomethacin and other Nonsteroidal anti-inflammatory medications as these will make you more likely to bleed and can cause stomach ulcers, can also cause Kidney problems.   2)Please talk to your Nephrologist about Increasing your dose of Aranesp or Procrit or Mircera due to your low blood count requiring Blood Transfusion---   3)Very low-salt diet advised  4)Weigh yourself daily, call if you gain more than 3 pounds in 1 day or more than 5 pounds in 1 week as your hemodialysis schedule and dry weight may need to be adjusted   5)Limit your Fluid  intake to no more than 50 ounces (1.5 Liters) per day  6)Please Take Protonix 40 mg twice a day with food to reduce chance of Bleeding  7)Please continue hemodialysis on Tuesdays, Thursdays and Saturdays  8)-Bilateral thigh wounds with wound VAC -continue wound VAC changes on Mondays Wednesdays and Fridays   9)Repeat BMP and CBC blood test check with your nephrologist on Tuesday, 04/27/2021   Admission Diagnosis  GI bleed [K92.2] Gastrointestinal hemorrhage, unspecified gastrointestinal hemorrhage type [K92.2] Acute GI bleeding [K92.2]   Discharge Diagnosis  GI bleed [K92.2] Gastrointestinal hemorrhage, unspecified gastrointestinal hemorrhage type [K92.2] Acute GI bleeding [K92.2]    Principal Problem:   GI bleed Active Problems:   Uncontrolled type 2 diabetes mellitus with chronic kidney disease, with long-term current use of insulin (HCC)   Hyperlipidemia   End stage renal disease (HCC)    Non-healing open wound of heel   Calciphylaxis   Acute blood loss anemia   Acute GI bleeding      Past Medical History:  Diagnosis Date  . Anemia   . 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 Diabetes mellitus, type II ([place])   . Dyslipidemia   . Glaucoma   . Hypertension   . Hypothyroidism (acquired)   . Kidney disease    Stage 5  . Pneumonia     Past Surgical History:  Procedure Laterality Date  . ABDOMINAL AORTOGRAM W/LOWER EXTREMITY Bilateral 12/18/2020   Procedure: ABDOMINAL AORTOGRAM W/LOWER EXTREMITY;  Surgeon: Elam Dutch, MD;  Location: [facility];  Service: Cardiovascular;  Laterality: Bilateral;  . ANKLE FRACTURE SURGERY    . AV FISTULA PLACEMENT Left 08/18/2020   Procedure: LEFT ARM BRACHIOCEPHALIC ARTERIOVENOUS (AV) FISTULA CREATION;  Surgeon: Elam Dutch, MD;  Location: [facility];  Service: Vascular;  Laterality: Left;  . CESAREAN SECTION    . CHOLECYSTECTOMY    . EYE SURGERY     Vatrectomy  . IR PERC TUN PERIT CATH WO PORT S&I [facility]  09/15/2020  . IR REMOVAL TUN CV CATH W/O FL  02/19/2021  . IR  US GUIDE VASC ACCESS RIGHT  09/15/2020  . TOE SURGERY       HPI  from the history and physical done on the day of admission:   Chief Complaint: Blood in stool  HPI: Amy Moses is a 50 y.o. female with medical history significant for ESRD on HD (TTS), T2DM, calciphylaxis with wound VAC, right eye blindness, hypothyroidism, hypertension, hyperlipidemia who presents to the emergency department for evaluation of blood in stool.  Patient states that she has noted intermittent black stool for about 1 to 2 weeks, she was concerned, so she called her PCP and she was asked to go to the ED for further evaluation.  Patient states that she went for dialysis today, but her AV fistula was infiltrated, so she was unable to have dialysis.  She ambulates with wheelchair due to right heel ulcer and bilateral thigh wounds due to  calciphylaxis with wound VAC.  She denies fever, chills, chest pain, shortness of breath or abdominal pain.  ED Course:  In the emergency department, she was hemodynamically stable, though BP was soft at 99/61.  Work-up in the ED showed H/H 6.9/23.6 (this was 10.2/32.8 on 04/01/2021), BUN/creatinine 43/1.57, ALP 146.  FOBT was positive. Type and screen was done and 1 unit of PRBC was negative for transfusion in the ED per ED physician.  GI was consulted and will see patient in the morning per ED physician.  Hospitalist was asked to admit patient for further evaluation and management.    Hospital Course:   Brief Summary:- 50 y.o.femalewith medical history significant forESRD on HD (TTS), T2DM, calciphylaxis with wound VAC, right eye blindness, hypothyroidism, hypertension, hyperlipidemia admitted on 04/22/2021 with concerns for GI bleed and drop in hemoglobin with endoluminal evaluation revealing bleeding transverse colon polyp as well as gastritis and duodenal erosions  A/p 1)Acute on chronic anemia due to acute blood loss/Gi Bleed- Hgb 8.1 from 6.1 -after a total of 2 units of PRBC were transfused -Advised patient to talk to nephrologist about possibly adjusting the dose or frequency of her EPO agent given anemia requiring transfusions -GI consult appreciated  -Colonoscopy on 04/24/2021 with Large polyp with clot attached to it and stigmata of recent bleeding. This is likely the culprit of patient's worsening anemia. EGD on 04/24/2021 with Gastritis and Duodenal erosions w/o active bleeding -PPI twice daily for 12 weeks at least advised  2)ESRD--usually TTS, missed hemodialysis on 04/22/2021 -Patient required serial/back-to-back HD session on 04/23/2021 and 04/24/2021 with transfusion of PRBC each day  3)Calciphylaxis with wound VAC on bilateral thighs----wound VAC changes on Mondays Wednesdays and Fridays  4)DM2-A1c 7.3 reflecting uncontrolled diabetes with hyperglycemia PTA -resume  home  insulin regimen   5)Hypothyroidism--- continue levothyroxine  Disposition--Home   disposition: The patient is from: Home  Anticipated d/c is to: Home    Code Status :  -  Code Status: Full Code   Family Communication:    NA (patient is alert, awake and coherent)   Consults  :  Gi/Nephrology  Discharge Condition: stable  Follow UP--- nephrologist  Diet and Activity recommendation:  As advised  Discharge Instructions    Discharge Instructions    Call MD for:  difficulty breathing, headache or visual disturbances   Complete by: As directed    Call MD for:  persistant dizziness or light-headedness   Complete by: As directed    Call MD for:  persistant nausea and vomiting   Complete by: As directed    Call MD for:  severe uncontrolled  pain   Complete by: As directed    Call MD for:  temperature >100.4   Complete by: As directed    Diet - low sodium heart healthy   Complete by: As directed    Discharge instructions   Complete by: As directed    1)Avoid ibuprofen/Advil/Aleve/Motrin/Goody Powders/Naproxen/BC powders/Meloxicam/Diclofenac/Indomethacin and other Nonsteroidal anti-inflammatory medications as these will make you more likely to bleed and can cause stomach ulcers, can also cause Kidney problems.   2)Please talk to your Nephrologist about Increasing your dose of Aranesp or Procrit or Mircera due to your low blood count requiring Blood Transfusion---   3)Very low-salt diet advised  4)Weigh yourself daily, call if you gain more than 3 pounds in 1 day or more than 5 pounds in 1 week as your hemodialysis schedule and dry weight may need to be adjusted   5)Limit your Fluid  intake to no more than 50 ounces (1.5 Liters) per day  6)Please Take Protonix 40 mg twice a day with food to reduce chance of Bleeding  7)Please continue hemodialysis on Tuesdays, Thursdays and Saturdays  8) continue wound VAC changes on Mondays Wednesdays and  Fridays  9)Repeat BMP and CBC blood test check with your nephrologist on Tuesday, 04/27/2021   Discharge wound care:   Complete by: As directed    Wound VAC change on Mondays Wednesdays and Fridays   Increase activity slowly   Complete by: As directed       Discharge Medications     Allergies as of 04/25/2021      Reactions   Ace Inhibitors Cough      Medication List    TAKE these medications   acetaminophen 500 MG tablet Commonly known as: TYLENOL Take 1,000 mg by mouth every 6 (six) hours as needed for moderate pain.   aspirin EC 81 MG tablet Take 1 tablet (81 mg total) by mouth daily with breakfast. What changed:   medication strength  how much to take  when to take this   atorvastatin 10 MG tablet Commonly known as: LIPITOR Take 1 tablet (10 mg total) by mouth daily.   HumaLOG KwikPen 100 UNIT/ML KwikPen Generic drug: insulin lispro Inject 5-15 Units into the skin 3 (three) times daily with meals.   insulin glargine 100 UNIT/ML injection Commonly known as: LANTUS Inject 50 Units into the skin at bedtime.   levothyroxine 50 MCG tablet Commonly known as: SYNTHROID Take 1 tablet (50 mcg total) by mouth daily before breakfast.   metoprolol succinate 50 MG 24 hr tablet Commonly known as: TOPROL-XL Take 1 tablet (50 mg total) by mouth at bedtime. Take with or immediately following a meal.   pantoprazole 40 MG tablet Commonly known as: Protonix Take 1 tablet (40 mg total) by mouth 2 (two) times daily.   sevelamer carbonate 800 MG tablet Commonly known as: RENVELA Take 1 tablet (800 mg total) by mouth 3 (three) times daily with meals.   sodium bicarbonate 650 MG tablet Take 1 tablet (650 mg total) by mouth 2 (two) times daily. What changed:   how much to take  when to take this   torsemide 100 MG tablet Commonly known as: DEMADEX Take 150 mg by mouth daily.   Vitamin D (Ergocalciferol) 1.25 MG (50000 UNIT) Caps capsule Commonly known as:  DRISDOL Take 1 capsule by mouth every 14 (fourteen) days.            Discharge Care Instructions  (From admission, onward)  Start     Ordered   04/25/21 0000  Discharge wound care:       Comments: Wound VAC change on Mondays Wednesdays and Fridays   04/25/21 1127         Major procedures and Radiology Reports - PLEASE review detailed and final reports for all details, in brief -  ECHOCARDIOGRAM COMPLETE  Result Date: 04/01/2021    ECHOCARDIOGRAM REPORT   Patient Name:   TERRENCE ABDULKARIM Date of Exam: 04/01/2021 Medical Rec #:  [id number]       Height:       66.0 in Accession #:    [id number]      Weight:       182.5 lb Date of Birth:  October 12, 1971      BSA:          1.924 m Patient Age:    28 years        BP:           125/70 mmHg Patient Gender: F               HR:           67 bpm. Exam Location:  Inpatient Procedure: 2D Echo, Cardiac Doppler and Color Doppler Indications:    CHF  History:        Patient has prior history of Echocardiogram examinations, most                 recent 06/10/2020. Risk Factors:Hypertension and Diabetes.  Sonographer:    Cammy Brochure Referring Phys: [place]  1. Left ventricular ejection fraction, by estimation, is 55 to 60%. The left ventricle has normal function. The left ventricle has no regional wall motion abnormalities. Left ventricular diastolic parameters are consistent with Grade II diastolic dysfunction (pseudonormalization).  2. Right ventricular systolic function is normal. The right ventricular size is normal.  3. Left atrial size was mildly dilated.  4. A small pericardial effusion is present.  5. The mitral valve is normal in structure. No evidence of mitral valve regurgitation.  6. Tricuspid valve regurgitation is mild to moderate.  7. The aortic valve is normal in structure. Aortic valve regurgitation is not visualized. FINDINGS  Left Ventricle: Left ventricular ejection fraction, by estimation, is 55 to 60%. The left  ventricle has normal function. The left ventricle has no regional wall motion abnormalities. The left ventricular internal cavity size was normal in size. There is  no left ventricular hypertrophy. Left ventricular diastolic parameters are consistent with Grade II diastolic dysfunction (pseudonormalization). Right Ventricle: The right ventricular size is normal. No increase in right ventricular wall thickness. Right ventricular systolic function is normal. Left Atrium: Left atrial size was mildly dilated. Right Atrium: Right atrial size was normal in size. Pericardium: A small pericardial effusion is present. Mitral Valve: The mitral valve is normal in structure. No evidence of mitral valve regurgitation. MV peak gradient, 8.4 mmHg. The mean mitral valve gradient is 4.0 mmHg. Tricuspid Valve: The tricuspid valve is normal in structure. Tricuspid valve regurgitation is mild to moderate. Aortic Valve: The aortic valve is normal in structure. Aortic valve regurgitation is not visualized. Aortic valve mean gradient measures 7.0 mmHg. Aortic valve peak gradient measures 14.7 mmHg. Aortic valve area, by VTI measures 1.30 cm. Pulmonic Valve: The pulmonic valve was normal in structure. Pulmonic valve regurgitation is trivial. Aorta: The aortic root and ascending aorta are structurally normal, with no evidence of dilitation. IAS/Shunts: The atrial septum is  grossly normal.  LEFT VENTRICLE PLAX 2D LVIDd:         4.90 cm  Diastology LVIDs:         3.40 cm  LV e' medial:    5.44 cm/s LV PW:         1.40 cm  LV E/e' medial:  24.1 LV IVS:        1.00 cm  LV e' lateral:   8.05 cm/s LVOT diam:     1.80 cm  LV E/e' lateral: 16.3 LV SV:         54 LV SV Index:   28 LVOT Area:     2.54 cm  RIGHT VENTRICLE             IVC RV Basal diam:  3.80 cm     IVC diam: 1.20 cm RV S prime:     13.30 cm/s TAPSE (M-mode): 2.4 cm LEFT ATRIUM             Index       RIGHT ATRIUM           Index LA diam:        4.50 cm 2.34 cm/m  RA Area:     16.10  cm LA Vol (A2C):   67.2 ml 34.93 ml/m RA Volume:   41.70 ml  21.68 ml/m LA Vol (A4C):   65.9 ml 34.26 ml/m LA Biplane Vol: 72.4 ml 37.64 ml/m  AORTIC VALVE AV Area (Vmax):    1.22 cm AV Area (Vmean):   1.19 cm AV Area (VTI):     1.30 cm AV Vmax:           192.00 cm/s AV Vmean:          127.000 cm/s AV VTI:            0.419 m AV Peak Grad:      14.7 mmHg AV Mean Grad:      7.0 mmHg LVOT Vmax:         91.70 cm/s LVOT Vmean:        59.500 cm/s LVOT VTI:          0.214 m LVOT/AV VTI ratio: 0.51  AORTA Ao Root diam: 2.50 cm MITRAL VALVE                TRICUSPID VALVE MV Area (PHT): 4.60 cm     TR Peak grad:   52.4 mmHg MV Area VTI:   1.53 cm     TR Vmax:        362.00 cm/s MV Peak grad:  8.4 mmHg MV Mean grad:  4.0 mmHg     SHUNTS MV Vmax:       1.45 m/s     Systemic VTI:  0.21 m MV Vmean:      89.5 cm/s    Systemic Diam: 1.80 cm MV Decel Time: 165 msec MV E velocity: 131.00 cm/s MV A velocity: 102.00 cm/s MV E/A ratio:  1.28 Mertie Moores MD Electronically signed by Mertie Moores MD Signature Date/Time: 04/01/2021/3:33:24 PM    Final     Micro Results   Recent Results (from the past 240 hour(s))  SARS CORONAVIRUS 2 (TAT 6-24 HRS) Nasopharyngeal Nasopharyngeal Swab     Status: None   Collection Time: 04/22/21 11:56 PM   Specimen: Nasopharyngeal Swab  Result Value Ref Range Status   SARS Coronavirus 2 NEGATIVE NEGATIVE Final    Comment: (NOTE) SARS-CoV-2 target nucleic acids are NOT DETECTED.  The SARS-CoV-2 RNA is generally detectable in upper and lower respiratory specimens during the acute phase of infection. Negative results do not preclude SARS-CoV-2 infection, do not rule out co-infections with other pathogens, and should not be used as the sole basis for treatment or other patient management decisions. Negative results must be combined with clinical observations, patient history, and epidemiological information. The expected result is Negative.  Fact Sheet for  Patients: SugarRoll.be  Fact Sheet for Healthcare Providers: https://www.woods-mathews.com/  This test is not yet approved or cleared by the [place] FDA and  has been authorized for detection and/or diagnosis of SARS-CoV-2 by FDA under an Emergency Use Authorization (EUA). This EUA will remain  in effect (meaning this test can be used) for the duration of the COVID-19 declaration under Se ction 564(b)(1) of the Act, 21 U.S.C. section 360bbb-3(b)(1), unless the authorization is terminated or revoked sooner.  Performed at [facility], [place] [address] [place], [place] [address]     Today   Subjective    Abigael Brohl today has no  New concerns No fever  Or chills   No Nausea, Vomiting or Diarrhea       Patient has been seen and examined prior to discharge   Objective   Blood pressure 122/62, pulse 65, temperature 98.4 F (36.9 C), temperature source Oral, resp. rate 18, height '5\' 6"'$  (1.676 m), last menstrual period 08/22/2015, SpO2 99 %.   Intake/Output Summary (Last 24 hours) at 04/25/2021 1128 Last data filed at 04/25/2021 0303 Gross per 24 hour  Intake 1518 ml  Output 2235 ml  Net -717 ml   Exam  Gen:- Awake Alert,  In no apparent distress  HEENT:- [place].AT, right eye vision loss Neck-Supple Neck,No JVD,.  Lungs-  CTAB , fair symmetrical air movement CV- S1, S2 normal, regular , 2/6 SM Abd-  +ve B.Sounds, Abd Soft, No tenderness,    Extremity/Skin:- No  edema, pedal pulses present  Psych-affect is appropriate, oriented x3 Neuro-no new focal deficits, no tremors MSK-Lt UE- AV fistula with thrill and bruit, bilateral thigh with wound vacs   Data Review   CBC w Diff:  Lab Results  Component Value Date   WBC 6.5 04/25/2021   HGB 8.1 (L) 04/25/2021   HCT 26.8 (L) 04/25/2021   PLT 175 04/25/2021   LYMPHOPCT 18 04/22/2021   MONOPCT 11 04/22/2021   EOSPCT 4 04/22/2021   BASOPCT 0 04/22/2021    CMP:  Lab  Results  Component Value Date   NA 135 04/24/2021   K 3.7 04/24/2021   CL 96 (L) 04/24/2021   CO2 28 04/24/2021   BUN 21 (H) 04/24/2021   CREATININE 3.07 (H) 04/24/2021   PROT 5.9 (L) 04/23/2021   ALBUMIN 2.7 (L) 04/24/2021   BILITOT 0.5 04/23/2021   ALKPHOS 119 04/23/2021   AST 12 (L) 04/23/2021   ALT 12 04/23/2021  .   Total Discharge time is about 33 minutes  Roxan Hockey M.D on 04/25/2021 at 11:28 AM  Go to [email or web address] -  for contact info  [facility] - Office  [phone]

## 2021-04-26 LAB — TYPE AND SCREEN
ABO/RH(D): O POS
Antibody Screen: NEGATIVE
Unit division: 0
Unit division: 0
Unit division: 0

## 2021-04-26 LAB — BPAM RBC
Blood Product Expiration Date: 202207072359
Blood Product Expiration Date: 202207112359
Blood Product Expiration Date: 202207122359
ISSUE DATE / TIME: 202206030753
ISSUE DATE / TIME: 202206041606
Unit Type and Rh: 5100
Unit Type and Rh: 5100
Unit Type and Rh: 5100

## 2021-04-27 LAB — SURGICAL PATHOLOGY

## 2021-04-30 ENCOUNTER — Encounter (HOSPITAL_COMMUNITY): Payer: Self-pay | Admitting: Internal Medicine

## 2021-05-03 ENCOUNTER — Encounter: Payer: Medicaid - Out of State | Attending: Physician Assistant | Admitting: Physician Assistant

## 2021-05-03 ENCOUNTER — Other Ambulatory Visit: Payer: Self-pay

## 2021-05-03 DIAGNOSIS — N186 End stage renal disease: Secondary | ICD-10-CM | POA: Diagnosis not present

## 2021-05-03 DIAGNOSIS — E1122 Type 2 diabetes mellitus with diabetic chronic kidney disease: Secondary | ICD-10-CM | POA: Diagnosis not present

## 2021-05-03 DIAGNOSIS — E11622 Type 2 diabetes mellitus with other skin ulcer: Secondary | ICD-10-CM | POA: Insufficient documentation

## 2021-05-03 DIAGNOSIS — L97812 Non-pressure chronic ulcer of other part of right lower leg with fat layer exposed: Secondary | ICD-10-CM | POA: Insufficient documentation

## 2021-05-03 DIAGNOSIS — I120 Hypertensive chronic kidney disease with stage 5 chronic kidney disease or end stage renal disease: Secondary | ICD-10-CM | POA: Diagnosis not present

## 2021-05-03 DIAGNOSIS — L89323 Pressure ulcer of left buttock, stage 3: Secondary | ICD-10-CM | POA: Insufficient documentation

## 2021-05-03 DIAGNOSIS — L97822 Non-pressure chronic ulcer of other part of left lower leg with fat layer exposed: Secondary | ICD-10-CM | POA: Insufficient documentation

## 2021-05-03 DIAGNOSIS — Z794 Long term (current) use of insulin: Secondary | ICD-10-CM | POA: Diagnosis not present

## 2021-05-03 DIAGNOSIS — Z992 Dependence on renal dialysis: Secondary | ICD-10-CM | POA: Diagnosis not present

## 2021-05-03 DIAGNOSIS — L89313 Pressure ulcer of right buttock, stage 3: Secondary | ICD-10-CM | POA: Insufficient documentation

## 2021-05-03 DIAGNOSIS — L89610 Pressure ulcer of right heel, unstageable: Secondary | ICD-10-CM | POA: Insufficient documentation

## 2021-05-03 NOTE — Progress Notes (Addendum)
Amy Moses, Amy Moses ([id number]) Visit Report for 05/03/2021 Chief Complaint Document Details Patient Name: RYLAH, HEMPLE Date of Service: 05/03/2021 1:15 PM Medical Record Number: [id number] Patient Account Number: [id number] Date of Birth/Sex: 09-20-1971 (50 y.o. F) Treating RN: Donnamarie Poag Primary Care Provider: Zenon Mayo Other Clinician: Referring Provider: Zenon Mayo Treating Provider/Extender: Skipper Cliche in Treatment: 21 Information Obtained from: Patient Chief Complaint 12/02/2020; patient is here for review of extensive wounds on her bilateral thighs as well as an area on her right plantar heel Electronic Signature(s) Signed: 05/03/2021 1:19:00 PM By: Worthy Keeler PA-C Entered By: Worthy Keeler on 05/03/2021 13:19:00 Amy Moses ([id number]) -------------------------------------------------------------------------------- Debridement Details Patient Name: Amy Moses Date of Service: 05/03/2021 1:15 PM Medical Record Number: [id number] Patient Account Number: [id number] Date of Birth/Sex: [birth year] (50 y.o. F) Treating RN: Donnamarie Poag Primary Care Provider: Zenon Mayo Other Clinician: Referring Provider: Zenon Mayo Treating Provider/Extender: Skipper Cliche in Treatment: 21 Debridement Performed for Wound #9 Left,Lateral Calf Assessment: Performed By: Physician Tommie Sams., PA-C Debridement Type: Debridement Level of Consciousness (Pre- Awake and Alert procedure): Pre-procedure Verification/Time Out Yes - 13:55 Taken: Start Time: 13:55 Total Area Debrided (L x W): 10.3 (cm) x 3.4 (cm) = 35.02 (cm) Tissue and other material Viable, Non-Viable, Eschar, Slough, Subcutaneous, Slough debrided: Level: Skin/Subcutaneous Tissue Debridement Description: Excisional Instrument: Scissors, Other : tweezers Bleeding: Moderate Hemostasis Achieved: Pressure End Time: 13:57 Response to Treatment: Procedure was tolerated well Level of  Consciousness (Post- Awake and Alert procedure): Post Debridement Measurements of Total Wound Length: (cm) 10.3 Width: (cm) 3.4 Depth: (cm) 0.9 Volume: (cm) 24.754 Character of Wound/Ulcer Post Debridement: Improved Post Procedure Diagnosis Same as Pre-procedure Electronic Signature(s) Signed: 05/03/2021 2:07:30 PM By: Donnamarie Poag Signed: 05/03/2021 5:46:17 PM By: Worthy Keeler PA-C Entered By: Donnamarie Poag on 05/03/2021 14:05:02 Amy Moses ([id number]) -------------------------------------------------------------------------------- HPI Details Patient Name: Amy Moses Date of Service: 05/03/2021 1:15 PM Medical Record Number: [id number] Patient Account Number: [id number] Date of Birth/Sex: May 27, 1971 (50 y.o. F) Treating RN: Donnamarie Poag Primary Care Provider: Zenon Mayo Other Clinician: Referring Provider: Zenon Mayo Treating Provider/Extender: Skipper Cliche in Treatment: 21 History of Present Illness HPI Description: ADMISSION 12/02/2020 This is a 50 year old woman who is a type II diabetic. Although there are mentions of type 1 diabetes in epic clearly this woman is type II based on the fact that she was on oral agents for 10 years before starting insulin. She also is in chronic renal failure and recently started on dialysis I think in November. She relates her problems starting in September she started to develop skin excoriation on her bilateral inner thighs. She thought this was a friction phenomenon however the tissue wrist can progressively broke down. She was seen by her primary doctor on 10/21/2020 noted to have erythema on both thighs medially and a blister. MRIs were ordered and she was put on Silvadene and gauze. She was seen in the ER on 12/12 at the [place] clinic also noted to have a right unstageable heel wound. 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 She dialyzes at [place] in [facility] and she was started on sodium thiosulfate as far as the notes state. The patient states she gets something at dialysis every day although she has not exactly sure what they are giving her. She was noted by vein and vascular in [place] to have multiple open wounds on 11/19/2020 using Xeroform gauze. She had an angiogram booked  by Dr. Oneida Alar predominantly I think because of the right heel ulcer although this was canceled by the patient because of the weather. The patient has large necrotic wounds on both inner thighs with covering black eschar. There is also an area on the left lateral thigh and an eschared area on her right plantar heel. She has been using Betadine to the right heel Xeroform to the areas on her legs. The patient has Medicaid but is able to get the Xeroform, were not really sure how she is managing this although she lives in [place] and there may be different rules for Medicaid in [place] versus [place]. We certainly would not be able to get that here. 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 I wondered whether her neuropathy is particularly affected her sensation of pain since her recent left patella fracture does not seem to have been that painful either Past medical history includes stage V chronic renal failure starting on dialysis I think in November, clearly type 2 diabetes with neuropathy, hypertension, glaucoma, vitamin D deficiency, history of cholecystectomy, edema of both legs, hypothyroidism, she dialyzes Tuesday Thursday and Saturday at [facility] in [place] The patient had arterial studies on 11/19/2020 I think at vein and vascular in [place]. She had biphasic waveforms throughout the thigh on the right and at the popliteal proximally and distally in the ATA but the PTA and peroneal were not visualized. On the left again biphasic waveforms up to the level of  the distal popliteal but not visualized in the tibial vessels. She was felt to have adequate flow where visualized. As noted she was supposed to have an angiogram which I think is certainly indicated [id number] upon evaluation today patient actually appears to be doing okay in regard to her wounds. This is actually the first time of seeing her she saw Dr. Dellia Nims at the last visit she does have quite an extensive history based on review. With that being said I do not see any signs right now of active infection which is great news. Overall I am extremely pleased with where things stand in that regard. 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 I really feel like she may do better with Dakin's moistened gauze. I do think that we can see about ordering the Dakin's for her she is already using gauze and then wrapping with roll gauze anyway so really would not change much except for moistening some gauze and applying it to the wound bed. With that being said I do not see any signs of active infection at this time which is great news. The patient all in all seems to be in fairly good spirits all things considered. 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 Fortunately it looks like some of the eschar is loosening up so we should be able to get this removed which hopefully will help in speeding up the healing process. With that being said with regard to the patient's gluteal region she unfortunately has new pressure ulcerations open today which I think could benefit from possibly having a air mattress. With that being said I explained to the patient that unfortunately this could still take some time as far as getting it to heal  completely. There does not appear to be any signs of active  infection at this time which is great news. No fevers, chills, nausea, vomiting, or diarrhea. 02/16/2021 upon evaluation today patient appears to be doing about the same in regard to her heel. Her gluteal area is actually getting better. She did have surgery in regard to her thighs bilaterally as well as her knee. It was not until she got into the operating room that it was noted that she had the wounds on the thighs. Subsequently according to the surgeons note dated 02/01/2021 they contacted the patient's daughter in order to discuss the fact they felt she needed to have the wound surgically debrided and a wound VAC placed and subsequently instead of patellar reconstruction they opted to proceed with removal of the Portion of the patella in order to get this area to heal more effectively and quickly. Overall that seems to have done quite well. 02/26/2021 upon evaluation today patient's wounds actually appear to be doing excellent pretty much across the board I am very pleased. With that being said the wounds where she has the wound vacs placed on the thighs in particular seems to be doing a very good job as far as healing is concerned. There does not appear to be any evidence of infection which is great news and overall I am extremely pleased in that regard. No fevers, chills, nausea, vomiting, or diarrhea. 03/12/21 upon evaluation today patient appears to be doing well with regard to her wounds. I think she is doing excellent with the wound VAC and very pleased in that regard. Fortunately there does not appear to be any signs of infection I am that a try to do a little bit of light debridement today due to some necrotic regions noted in her wounds to try to help speed up the healing process here. 04/05/2021 upon evaluation today patient appears to be doing decently well in general in regard to her wounds. She was unfortunately in the hospital from the 10th through the 12th of this month due to having  high fever and subsequently they did not actually determine exactly what was going on they thought she might of just been sick or had some kind of a cold. With that being said fortunately there does not appear to be any evidence of active infection at this time which is great news. No fevers, chills, nausea, vomiting, or diarrhea. Amy Moses, Amy Moses ([id number]) 05/03/2021 upon evaluation today patient actually appears to be doing excellent for the most part in regard to the wounds on her thighs. I am extremely happy with what I see today. In fact I feel like it is probably time for [name] to discontinue the use of the wound VAC as this appears to be doing so well. The patient voiced an understanding and agreement and is definitely happy to get rid of the wound VAC. She was recently in the hospital but this was secondary to something unrelated to her wound she had a polyp which was bleeding and had to be taken care of this was causing significant issues she has since been taken off of her aspirin. Electronic Signature(s) Signed: 05/03/2021 5:42:32 PM By: Worthy Keeler PA-C Entered By: Worthy Keeler on 05/03/2021 17:42:32 Amy Moses ([id number]) -------------------------------------------------------------------------------- Physical Exam Details Patient Name: Amy Moses Date of Service: 05/03/2021 1:15 PM Medical Record Number: [id number] Patient Account Number: [id number] Date of Birth/Sex: [birth year] (49 y.o. F) Treating RN: Donnamarie Poag Primary Care Provider: Zenon Mayo Other Clinician:  Referring Provider: Zenon Mayo Treating Provider/Extender: Skipper Cliche in Treatment: 23 Constitutional Well-nourished and well-hydrated in no acute distress. Respiratory normal breathing without difficulty. Psychiatric this patient is able to make decisions and demonstrates good insight into disease process. Alert and Oriented x 3. pleasant and cooperative. Notes Upon inspection  patient's wound bed actually showed signs again of good granulation epithelization in regard to the wounds on her thighs and upper legs. I am very pleased in that regard. In regard to her lower extremity she does have edema she also has areas where the eschar starting to breakdown on the left lateral leg, right lateral/posterior leg, right heel. All of these areas I think would benefit from a Dakin's moistened gauze dressing which will probably be the best way to go at this point. With regard to the left heel this is still dry stable eschar I think Betadine is still appropriate here. Electronic Signature(s) Signed: 05/03/2021 5:43:20 PM By: Worthy Keeler PA-C Entered By: Worthy Keeler on 05/03/2021 17:43:20 Amy Moses ([id number]) -------------------------------------------------------------------------------- Physician Orders Details Patient Name: Amy Moses Date of Service: 05/03/2021 1:15 PM Medical Record Number: [id number] Patient Account Number: [id number] Date of Birth/Sex: 05/04/1971 (49 y.o. F) Treating RN: Donnamarie Poag Primary Care Provider: Zenon Mayo Other Clinician: Referring Provider: Zenon Mayo Treating Provider/Extender: Skipper Cliche in Treatment: 21 Verbal / Phone Orders: No Diagnosis Coding ICD-10 Coding Code Description E83.59 Other disorders of calcium metabolism L97.118 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 L97.128 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 L89.610 Pressure ulcer of right heel, unstageable E11.42 Type 2 diabetes mellitus with diabetic polyneuropathy L89.313 Pressure ulcer of right buttock, stage 3 L89.323 Pressure ulcer of left buttock, stage 3 Follow-up Appointments o Return Appointment in 2 weeks. [facility] [place]: - [facility] for wound care. May utilize formulary equivalent dressing for wound treatment orders  unless otherwise specified. Home Health Nurse may visit PRN to address patientos wound care needs. - 3 x a week Negative Pressure Wound Therapy o Other: - DISCONTINUE WOUND VAC ON BOTH LEGS--SEE NEW WOUND CARE ORDERS FOR BOTH LEGS Wound #1 Right,Medial Upper Leg o Other: - DISCONTINUE wound vacs all prior wounds-see new orders Wound Treatment Wound #1 - Upper Leg Wound Laterality: Right, Medial Cleanser: Normal Saline ([place]) 3 x Per Week/30 Days Discharge Instructions: Wash your hands with soap and water. Remove old dressing, discard into plastic bag and place into trash. Cleanse the wound with Normal Saline prior to applying a clean dressing using gauze sponges, not tissues or cotton balls. Do not scrub or use excessive force. Pat dry using gauze sponges, not tissue or cotton balls. Primary Dressing: Hydrofera Blue Ready Transfer Foam, 2.5x2.5 (in/in) (Home Health) 3 x Per Week/30 Days Discharge Instructions: Apply Hydrofera Blue Ready to wound bed as directed Secondary Dressing: ABD Pad 5x9 (in/in) ([place]) 3 x Per Week/30 Days Discharge Instructions: Cover with ABD pad Secured With: 12M Medipore H Soft Cloth Surgical Tape, 2x2 (in/yd) ([place]) 3 x Per Week/30 Days Wound #10 - Calf Wound Laterality: Right, Lateral Cleanser: Dakin 16 (oz) 0.25 (Home Health) 1 x Per Day/30 Days Discharge Instructions: Use as directed. Primary Dressing: Gauze (Home Health) 1 x Per Day/30 Days Discharge Instructions: As directed: dry, moistened with saline or moistened with Dakins Solution Secondary Dressing: ABD Pad 5x9 (in/in) (Home Health) 1 x Per Day/30 Days Discharge Instructions: Cover with ABD pad Secured  With: 842M Medipore H Soft Cloth Surgical Tape, 2x2 (in/yd) (Home Health) 1 x Per Day/30 Days Secured With: Conforming Stretch Gauze Bandage 4x75 (in/in) (Home Health) 1 x Per Day/30 Days Discharge Instructions: Apply as directed Amy Moses, Amy Moses ([id number]) Wound #11 - Lower Leg  Wound Laterality: Right, Posterior, Distal Cleanser: Dakin 16 (oz) 0.25 (Home Health) 1 x Per Day/30 Days Discharge Instructions: Use as directed. Primary Dressing: Gauze (Home Health) 1 x Per Day/30 Days Discharge Instructions: As directed: dry, moistened with saline or moistened with Dakins Solution Secondary Dressing: ABD Pad 5x9 (in/in) ([place]) 1 x Per Day/30 Days Discharge Instructions: Cover with ABD pad Secured With: 842M Medipore H Soft Cloth Surgical Tape, 2x2 (in/yd) ([place]) 1 x Per Day/30 Days Secured With: Conforming Stretch Gauze Bandage 4x75 (in/in) (Home Health) 1 x Per Day/30 Days Discharge Instructions: Apply as directed Wound #12 - Calcaneus Wound Laterality: Left, Lateral Cleanser: Soap and Water 1 x Per Day/30 Days Discharge Instructions: Gently cleanse wound with antibacterial soap, rinse and pat dry prior to dressing wounds Topical: Betadine 1 x Per Day/30 Days Discharge Instructions: apply thin layer and allow to dry Wound #2 - Upper Leg Wound Laterality: Left, Medial Cleanser: Normal Saline (Home Health) 3 x Per Week/30 Days Discharge Instructions: Wash your hands with soap and water. Remove old dressing, discard into plastic bag and place into trash. Cleanse the wound with Normal Saline prior to applying a clean dressing using gauze sponges, not tissues or cotton balls. Do not scrub or use excessive force. Pat dry using gauze sponges, not tissue or cotton balls. Primary Dressing: Hydrofera Blue Ready Transfer Foam, 2.5x2.5 (in/in) (Home Health) 3 x Per Week/30 Days Discharge Instructions: Apply Hydrofera Blue Ready to wound bed as directed Secondary Dressing: ABD Pad 5x9 (in/in) ([place]) 3 x Per Week/30 Days Discharge Instructions: Cover with ABD pad Secured With: 842M Medipore H Soft Cloth Surgical Tape, 2x2 (in/yd) ([place]) 3 x Per Week/30 Days Wound #3 - Upper Leg Wound Laterality: Left, Lateral Cleanser: Dakin 16 (oz) 0.25 (Home Health) 1 x Per  Day/30 Days Discharge Instructions: Use as directed. Primary Dressing: Gauze (Home Health) 1 x Per Day/30 Days Discharge Instructions: As directed: dry, moistened with saline or moistened with Dakins Solution Secondary Dressing: ABD Pad 5x9 (in/in) (Home Health) 1 x Per Day/30 Days Discharge Instructions: Cover with ABD pad Secured With: 842M Medipore H Soft Cloth Surgical Tape, 2x2 (in/yd) ([place]) 1 x Per Day/30 Days Secured With: Conforming Stretch Gauze Bandage 4x75 (in/in) (Home Health) 1 x Per Day/30 Days Discharge Instructions: Apply as directed Wound #4 - Calcaneus Wound Laterality: Right Cleanser: Dakin 16 (oz) 0.25 (Home Health) 1 x Per Day/30 Days Discharge Instructions: Use as directed. Primary Dressing: Gauze (Home Health) 1 x Per Day/30 Days Discharge Instructions: As directed: dry, moistened with saline or moistened with Dakins Solution Secondary Dressing: ABD Pad 5x9 (in/in) ([place]) 1 x Per Day/30 Days Discharge Instructions: Cover with ABD pad Secured With: 842M Medipore H Soft Cloth Surgical Tape, 2x2 (in/yd) ([place]) 1 x Per Day/30 Days Secured With: Conforming Stretch Gauze Bandage 4x75 (in/in) ([place]) 1 x Per Day/30 Days Amy Moses, Amy Moses ([id number]) Discharge Instructions: Apply as directed Wound #9 - Calf Wound Laterality: Left, Lateral Cleanser: Dakin 16 (oz) 0.25 (Home Health) 1 x Per Day/30 Days Discharge Instructions: Use as directed. Primary Dressing: Gauze (Home Health) 1 x Per Day/30 Days Discharge Instructions: As directed: dry, moistened with saline or moistened with Dakins Solution Secondary  Dressing: ABD Pad 5x9 (in/in) (Home Health) 1 x Per Day/30 Days Discharge Instructions: Cover with ABD pad Secured With: 33M Medipore H Soft Cloth Surgical Tape, 2x2 (in/yd) ([place]) 1 x Per Day/30 Days Secured With: Conforming Stretch Gauze Bandage 4x75 (in/in) (Home Health) 1 x Per Day/30 Days Discharge Instructions: Apply as  directed Electronic Signature(s) Signed: 05/03/2021 5:46:17 PM By: Worthy Keeler PA-C Signed: 05/05/2021 3:34:38 PM By: Carlene Coria RN Previous Signature: 05/03/2021 2:07:30 PM Version By: Donnamarie Poag Entered By: Carlene Coria on 05/03/2021 14:36:12 Amy Moses ([id number]) -------------------------------------------------------------------------------- Problem List Details Patient Name: Amy Moses Date of Service: 05/03/2021 1:15 PM Medical Record Number: [id number] Patient Account Number: [id number] Date of Birth/Sex: 08/23/1971 (49 y.o. F) Treating RN: Donnamarie Poag Primary Care Provider: Zenon Mayo Other Clinician: Referring Provider: Zenon Mayo Treating Provider/Extender: Skipper Cliche in Treatment: 21 Active Problems ICD-10 Encounter Code Description Active Date MDM Diagnosis E83.59 Other disorders of calcium metabolism 12/02/2020 No Yes L97.118 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 12/02/2020 No Yes L97.128 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 12/02/2020 No Yes L89.610 Pressure ulcer of right heel, unstageable 12/02/2020 No Yes E11.42 Type 2 diabetes mellitus with diabetic polyneuropathy 12/02/2020 No Yes L89.313 Pressure ulcer of right buttock, stage 3 01/19/2021 No Yes L89.323 Pressure ulcer of left buttock, stage 3 01/19/2021 No Yes L97.822 Non-pressure chronic ulcer of other part of left lower leg with fat layer 05/03/2021 No Yes exposed L97.812 Non-pressure chronic ulcer of other part of right lower leg with fat layer 05/03/2021 No Yes exposed Inactive Problems Resolved Problems Electronic Signature(s) Signed: 05/03/2021 5:45:28 PM By: Worthy Keeler PA-C Previous Signature: 05/03/2021 1:18:53 PM Version By: Worthy Keeler PA-C Entered By: Worthy Keeler on 05/03/2021 17:45:27 Amy Moses  ([id number]) -------------------------------------------------------------------------------- Progress Note Details Patient Name: Amy Moses Date of Service: 05/03/2021 1:15 PM Medical Record Number: [id number] Patient Account Number: [id number] Date of Birth/Sex: [birth year] (49 y.o. F) Treating RN: Donnamarie Poag Primary Care Provider: Zenon Mayo Other Clinician: Referring Provider: Zenon Mayo Treating Provider/Extender: Skipper Cliche in Treatment: 21 Subjective Chief Complaint Information obtained from Patient 12/02/2020; patient is here for review of extensive wounds on her bilateral thighs as well as an area on her right plantar heel History of Present Illness (HPI) ADMISSION 12/02/2020 This is a 50 year old woman who is a type II diabetic. Although there are mentions of type 1 diabetes in epic clearly this woman is type II based on the fact that she was on oral agents for 10 years before starting insulin. She also is in chronic renal failure and recently started on dialysis I think in November. She relates her problems starting in September she started to develop skin excoriation on her bilateral inner thighs. She thought this was a friction phenomenon however the tissue wrist can progressively broke down. She was seen by her primary doctor on 10/21/2020 noted to have erythema on both thighs medially and a blister. MRIs were ordered and she was put on Silvadene and gauze. She was seen in the ER on 12/12 at the [place] clinic also noted to have a right unstageable heel wound. 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 She dialyzes at [place] in [facility] and she was started on sodium thiosulfate as far as the notes state. The patient states she gets something at dialysis every day although she has not exactly sure what they are giving  her. She was noted by vein and vascular in [place] to have multiple open wounds  on 11/19/2020 using Xeroform gauze. She had an angiogram booked by Dr. Oneida Alar predominantly I think because of the right heel ulcer although this was canceled by the patient because of the weather. The patient has large necrotic wounds on both inner thighs with covering black eschar. There is also an area on the left lateral thigh and an eschared area on her right plantar heel. She has been using Betadine to the right heel Xeroform to the areas on her legs. The patient has Medicaid but is able to get the Xeroform, were not really sure how she is managing this although she lives in [place] and there may be different rules for Medicaid in [place] versus [place]. We certainly would not be able to get that here. 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 I wondered whether her neuropathy is particularly affected her sensation of pain since her recent left patella fracture does not seem to have been that painful either Past medical history includes stage V chronic renal failure starting on dialysis I think in November, clearly type 2 diabetes with neuropathy, hypertension, glaucoma, vitamin D deficiency, history of cholecystectomy, edema of both legs, hypothyroidism, she dialyzes Tuesday Thursday and Saturday at [facility] in [place] The patient had arterial studies on 11/19/2020 I think at [facility] in [place]. She had biphasic waveforms throughout the thigh on the right and at the popliteal proximally and distally in the ATA but the PTA and peroneal were not visualized. On the left again biphasic waveforms up to the level of the distal popliteal but not visualized in the tibial vessels. She was felt to have adequate flow where visualized. As noted she was supposed to have an angiogram which I think is certainly indicated [id number] upon evaluation today patient actually appears to be doing okay in  regard to her wounds. This is actually the first time of seeing her she saw Dr. Dellia Nims at the last visit she does have quite an extensive history based on review. With that being said I do not see any signs right now of active infection which is great news. Overall I am extremely pleased with where things stand in that regard. 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 I really feel like she may do better with Dakin's moistened gauze. I do think that we can see about ordering the Dakin's for her she is already using gauze and then wrapping with roll gauze anyway so really would not change much except for moistening some gauze and applying it to the wound bed. With that being said I do not see any signs of active infection at this time which is great news. The patient all in all seems to be in fairly good spirits all things considered. 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 Fortunately it looks like some of the eschar is loosening up so we should be able to get this removed which hopefully will help in speeding up the healing process. With that being said with regard to the patient's gluteal region she unfortunately has new pressure ulcerations open today which I think could benefit from possibly having a  air mattress. With that being said I explained to the patient that unfortunately this could still take some time as far as getting it to heal completely. There does not appear to be any signs of active infection at this time which is great news. No fevers, chills, nausea, vomiting, or diarrhea. 02/16/2021 upon evaluation today patient appears to be doing about the same in regard to her heel. Her gluteal area is actually getting better. She did have surgery in regard to her  thighs bilaterally as well as her knee. It was not until she got into the operating room that it was noted that she had the wounds on the thighs. Subsequently according to the surgeons note dated 02/01/2021 they contacted the patient's daughter in order to discuss the fact they felt she needed to have the wound surgically debrided and a wound VAC placed and subsequently instead of patellar reconstruction they opted to proceed with removal of the Portion of the patella in order to get this area to heal more effectively and quickly. Overall that seems to have done quite well. 02/26/2021 upon evaluation today patient's wounds actually appear to be doing excellent pretty much across the board I am very pleased. With that being said the wounds where she has the wound vacs placed on the thighs in particular seems to be doing a very good job as far as healing is concerned. There does not appear to be any evidence of infection which is great news and overall I am extremely pleased in that regard. No fevers, chills, nausea, vomiting, or diarrhea. 03/12/21 upon evaluation today patient appears to be doing well with regard to her wounds. I think she is doing excellent with the wound VAC and very pleased in that regard. Fortunately there does not appear to be any signs of infection I am that a try to do a little bit of light debridement today due to some necrotic regions noted in her wounds to try to help speed up the healing process here. Amy Moses, Amy Moses ([id number]) 04/05/2021 upon evaluation today patient appears to be doing decently well in general in regard to her wounds. She was unfortunately in the hospital from the 10th through the 12th of this month due to having high fever and subsequently they did not actually determine exactly what was going on they thought she might of just been sick or had some kind of a cold. With that being said fortunately there does not appear to be any evidence of active infection  at this time which is great news. No fevers, chills, nausea, vomiting, or diarrhea. 05/03/2021 upon evaluation today patient actually appears to be doing excellent for the most part in regard to the wounds on her thighs. I am extremely happy with what I see today. In fact I feel like it is probably time for [name] to discontinue the use of the wound VAC as this appears to be doing so well. The patient voiced an understanding and agreement and is definitely happy to get rid of the wound VAC. She was recently in the hospital but this was secondary to something unrelated to her wound she had a polyp which was bleeding and had to be taken care of this was causing significant issues she has since been taken off of her aspirin. Objective Constitutional Well-nourished and well-hydrated in no acute distress. Vitals Time Taken: 12:59 PM, Height: 66 in, Weight: 235 lbs, BMI: 37.9, Temperature: 97.8 F, Pulse: 81 bpm, Respiratory Rate: 18 breaths/min, Blood Pressure: 157/82 mmHg.  Respiratory normal breathing without difficulty. Psychiatric this patient is able to make decisions and demonstrates good insight into disease process. Alert and Oriented x 3. pleasant and cooperative. General Notes: Upon inspection patient's wound bed actually showed signs again of good granulation epithelization in regard to the wounds on her thighs and upper legs. I am very pleased in that regard. In regard to her lower extremity she does have edema she also has areas where the eschar starting to breakdown on the left lateral leg, right lateral/posterior leg, right heel. All of these areas I think would benefit from a Dakin's moistened gauze dressing which will probably be the best way to go at this point. With regard to the left heel this is still dry stable eschar I think Betadine is still appropriate here. Integumentary (Hair, Skin) Wound #1 status is Open. Original cause of wound was Gradually Appeared. The date acquired was:  07/22/2020. The wound has been in treatment 21 weeks. The wound is located on the Right,Medial Upper Leg. The wound measures 6.2cm length x 15cm width x 0.4cm depth; 73.042cm^2 area and 29.217cm^3 volume. Wound #10 status is Open. Original cause of wound was Gradually Appeared. The date acquired was: 02/16/2021. The wound has been in treatment 10 weeks. The wound is located on the Right,Lateral Calf. The wound measures 2.2cm length x 1.4cm width x 0.4cm depth; 2.419cm^2 area and 0.968cm^3 volume. There is Fat Layer (Subcutaneous Tissue) exposed. There is no tunneling or undermining noted. There is a medium amount of serosanguineous drainage noted. There is small (1-33%) pink granulation within the wound bed. There is a large (67- 100%) amount of necrotic tissue within the wound bed including Adherent Slough. Wound #11 status is Open. Original cause of wound was Gradually Appeared. The date acquired was: 03/21/2021. The wound has been in treatment 4 weeks. The wound is located on the Right,Distal,Posterior Lower Leg. The wound measures 6.8cm length x 4.5cm width x 0.1cm depth; 24.033cm^2 area and 2.403cm^3 volume. There is Fat Layer (Subcutaneous Tissue) exposed. There is no tunneling or undermining noted. There is a none present amount of drainage noted. There is no granulation within the wound bed. There is a large (67-100%) amount of necrotic tissue within the wound bed including Eschar. Wound #12 status is Open. Original cause of wound was Gradually Appeared. The date acquired was: 04/21/2021. The wound is located on the Left,Lateral Calcaneus. The wound measures 2cm length x 3cm width x 0.1cm depth; 4.712cm^2 area and 0.471cm^3 volume. There is no tunneling or undermining noted. There is a none present amount of drainage noted. There is no granulation within the wound bed. There is a large (67-100%) amount of necrotic tissue within the wound bed including Eschar. Wound #2 status is Open. Original cause  of wound was Gradually Appeared. The date acquired was: 07/22/2020. The wound has been in treatment 21 weeks. The wound is located on the Left,Medial Upper Leg. The wound measures 10.3cm length x 2cm width x 0.2cm depth; 16.179cm^2 area and 3.236cm^3 volume. There is Fat Layer (Subcutaneous Tissue) exposed. There is no tunneling or undermining noted. There is a medium amount of serosanguineous drainage noted. There is large (67-100%) red granulation within the wound bed. There is a small (1-33%) amount of necrotic tissue within the wound bed. Wound #3 status is Open. Original cause of wound was Gradually Appeared. The date acquired was: 07/22/2020. The wound has been in treatment 21 weeks. The wound is located on the Left,Lateral Upper Leg. The wound measures 1.4cm  length x 7.3cm width x 0.4cm depth; 8.027cm^2 area and 3.211cm^3 volume. There is Fat Layer (Subcutaneous Tissue) exposed. There is no tunneling or undermining noted. There is a medium amount of serosanguineous drainage noted. There is large (67-100%) red granulation within the wound bed. There is a small (1-33%) amount of necrotic tissue within the wound bed. Wound #4 status is Open. Original cause of wound was Gradually Appeared. The date acquired was: 10/21/2020. The wound has been in treatment 21 weeks. The wound is located on the Right Calcaneus. The wound measures 3.7cm length x 3.4cm width x 0.5cm depth; 9.88cm^2 area and Gullett, Preslie ([id number]) 4.94cm^3 volume. There is Fat Layer (Subcutaneous Tissue) exposed. There is no tunneling or undermining noted. There is a large amount of serosanguineous drainage noted. There is small (1-33%) red granulation within the wound bed. There is a large (67-100%) amount of necrotic tissue within the wound bed including Adherent Slough. Wound #6 status is Healed - Epithelialized. Original cause of wound was Gradually Appeared. The date acquired was: 12/22/2020. The wound has been in treatment 15  weeks. The wound is located on the Right Gluteus. The wound measures 0cm length x 0cm width x 0cm depth; 0cm^2 area and 0cm^3 volume. Wound #8 status is Healed - Epithelialized. Original cause of wound was Gradually Appeared. The date acquired was: 12/22/2020. The wound has been in treatment 15 weeks. The wound is located on the Left Gluteus. The wound measures 0cm length x 0cm width x 0cm depth; 0cm^2 area and 0cm^3 volume. Wound #9 status is Open. Original cause of wound was Gradually Appeared. The date acquired was: 02/16/2021. The wound has been in treatment 10 weeks. The wound is located on the Left,Lateral Calf. The wound measures 10.3cm length x 3.4cm width x 0.9cm depth; 27.505cm^2 area and 24.754cm^3 volume. There is Fat Layer (Subcutaneous Tissue) exposed. There is no tunneling or undermining noted. There is a large amount of serosanguineous drainage noted. There is small (1-33%) pink granulation within the wound bed. There is a large (67-100%) amount of necrotic tissue within the wound bed including Eschar and Adherent Slough. 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 There was a Excisional Skin/Subcutaneous Tissue Debridement with a total area of 35.02 sq cm performed by Tommie Sams., PA-C. With the following instrument(s): Scissors, tweezers to remove Viable and  Non-Viable tissue/material. Material removed includes Eschar, Subcutaneous Tissue, and Slough. A time out was conducted at 13:55, prior to the start of the procedure. A Moderate amount of bleeding was controlled with Pressure. The procedure was tolerated well. Post Debridement Measurements: 10.3cm length x 3.4cm width x 0.9cm depth; 24.754cm^3 volume. Character of Wound/Ulcer Post Debridement is improved. Post procedure Diagnosis Wound #9: Same as Pre-Procedure Plan Follow-up Appointments: Return Appointment in 2 weeks. Home Health: [place]: - [facility] for wound care. May utilize formulary equivalent dressing for wound treatment orders unless otherwise specified. Home Health Nurse may visit  PRN to address patient s wound care needs. - 3 x a week Negative Pressure Wound Therapy: Other: - DISCONTINUE WOUND VAC ON BOTH LEGS--SEE NEW WOUND CARE ORDERS FOR BOTH LEGS Wound #1 Right,Medial Upper Leg: Other: - DISCONTINUE wound vacs all prior wounds-see new orders WOUND #1: - Upper Leg Wound Laterality: Right, Medial Cleanser: Normal Saline ([place]) 3 x Per Week/30 Days Discharge Instructions: Wash your hands with soap and water. Remove old dressing, discard into plastic bag and place into trash. Cleanse the wound with Normal Saline prior to applying a clean dressing using gauze sponges, not tissues or cotton balls. Do not scrub or use excessive force. Pat dry using gauze sponges, not tissue or cotton balls. Primary Dressing: Hydrofera Blue Ready Transfer Foam, 2.5x2.5 (in/in) (Home Health) 3 x Per Week/30 Days Amy Moses, Amy Moses ([id number]) Discharge Instructions: Apply Hydrofera Blue Ready to wound bed as directed Secondary Dressing: ABD Pad 5x9 (in/in) (Home Health) 3 x Per Week/30 Days Discharge Instructions: Cover with ABD pad Secured With: 50M Medipore H Soft Cloth Surgical Tape, 2x2 (in/yd) ([place]) 3 x Per Week/30 Days WOUND #10: - Calf Wound  Laterality: Right, Lateral Cleanser: Dakin 16 (oz) 0.25 (Home Health) 1 x Per Day/30 Days Discharge Instructions: Use as directed. Primary Dressing: Gauze (Home Health) 1 x Per Day/30 Days Discharge Instructions: As directed: dry, moistened with saline or moistened with Dakins Solution Secondary Dressing: ABD Pad 5x9 (in/in) (Home Health) 1 x Per Day/30 Days Discharge Instructions: Cover with ABD pad Secured With: 50M Medipore H Soft Cloth Surgical Tape, 2x2 (in/yd) ([place]) 1 x Per Day/30 Days Secured With: Conforming Stretch Gauze Bandage 4x75 (in/in) (Home Health) 1 x Per Day/30 Days Discharge Instructions: Apply as directed WOUND #11: - Lower Leg Wound Laterality: Right, Posterior, Distal Cleanser: Dakin 16 (oz) 0.25 (Home Health) 1 x Per Day/30 Days Discharge Instructions: Use as directed. Primary Dressing: Gauze (Home Health) 1 x Per Day/30 Days Discharge Instructions: As directed: dry, moistened with saline or moistened with Dakins Solution Secondary Dressing: ABD Pad 5x9 (in/in) ([place]) 1 x Per Day/30 Days Discharge Instructions: Cover with ABD pad Secured With: 50M Medipore H Soft Cloth Surgical Tape, 2x2 (in/yd) ([place]) 1 x Per Day/30 Days Secured With: Conforming Stretch Gauze Bandage 4x75 (in/in) (Home Health) 1 x Per Day/30 Days Discharge Instructions: Apply as directed WOUND #12: - Calcaneus Wound Laterality: Left, Lateral Cleanser: Soap and Water 1 x Per Day/30 Days Discharge Instructions: Gently cleanse wound with antibacterial soap, rinse and pat dry prior to dressing wounds Topical: Betadine 1 x Per Day/30 Days Discharge Instructions: apply thin layer and allow to dry WOUND #2: - Upper Leg Wound Laterality: Left, Medial Cleanser: Normal Saline (Home Health) 3 x Per Week/30 Days Discharge Instructions: Wash your hands with soap and water. Remove old dressing, discard into plastic bag and place into trash. Cleanse the wound with Normal Saline prior to  applying a clean dressing using gauze sponges, not tissues or cotton balls. Do not scrub or use excessive force. Pat dry using gauze sponges, not tissue or cotton balls. Primary Dressing: Hydrofera Blue Ready Transfer Foam, 2.5x2.5 (in/in) (Home Health) 3 x Per Week/30 Days Discharge Instructions: Apply Hydrofera Blue Ready to wound bed as directed Secondary Dressing: ABD Pad 5x9 (in/in) ([place]) 3 x Per Week/30 Days Discharge Instructions: Cover with ABD pad Secured With: 50M Medipore H Soft Cloth Surgical Tape, 2x2 (in/yd) ([place]) 3 x Per Week/30 Days WOUND #3: - Upper Leg Wound Laterality:  Left, Lateral Cleanser: Dakin 16 (oz) 0.25 (Home Health) 1 x Per Day/30 Days Discharge Instructions: Use as directed. Primary Dressing: Gauze (Home Health) 1 x Per Day/30 Days Discharge Instructions: As directed: dry, moistened with saline or moistened with Dakins Solution Secondary Dressing: ABD Pad 5x9 (in/in) ([place]) 1 x Per Day/30 Days Discharge Instructions: Cover with ABD pad Secured With: 50M Medipore H Soft Cloth Surgical Tape, 2x2 (in/yd) ([place]) 1 x Per Day/30 Days Secured With: Conforming Stretch Gauze Bandage 4x75 (in/in) (Home Health) 1 x Per Day/30 Days Discharge Instructions: Apply as directed WOUND #4: - Calcaneus Wound Laterality: Right Cleanser: Dakin 16 (oz) 0.25 (Home Health) 1 x Per Day/30 Days Discharge Instructions: Use as directed. Primary Dressing: Gauze (Home Health) 1 x Per Day/30 Days Discharge Instructions: As directed: dry, moistened with saline or moistened with Dakins Solution Secondary Dressing: ABD Pad 5x9 (in/in) (Home Health) 1 x Per Day/30 Days Discharge Instructions: Cover with ABD pad Secured With: 50M Medipore H Soft Cloth Surgical Tape, 2x2 (in/yd) ([place]) 1 x Per Day/30 Days Secured With: Conforming Stretch Gauze Bandage 4x75 (in/in) (Home Health) 1 x Per Day/30 Days Discharge Instructions: Apply as directed WOUND #9: - Calf Wound  Laterality: Left, Lateral Cleanser: Dakin 16 (oz) 0.25 (Home Health) 1 x Per Day/30 Days Discharge Instructions: Use as directed. Primary Dressing: Gauze (Home Health) 1 x Per Day/30 Days Discharge Instructions: As directed: dry, moistened with saline or moistened with Dakins Solution Secondary Dressing: ABD Pad 5x9 (in/in) (Home Health) 1 x Per Day/30 Days Discharge Instructions: Cover with ABD pad Secured With: 50M Medipore H Soft Cloth Surgical Tape, 2x2 (in/yd) ([place]) 1 x Per Day/30 Days Secured With: Conforming Stretch Gauze Bandage 4x75 (in/in) (Home Health) 1 x Per Day/30 Days Discharge Instructions: Apply as directed 1. Recommend that we switch to Betadine to the left stable heel eschar at this point since that seems to be doing so well. 2. We will also switch to [facility] for the upper leg/thigh regions where these wounds are doing excellent we will get a discontinue the wound VAC and will be using Hydrofera Blue instead. 3. I am also can recommend that we switch to Dakin's moistened gauze over the heel of the right foot as well as the right lateral leg and left lateral leg. 4. I am also can recommend that the patient continue to elevate her legs is much as possible keeping pressure off of the back of her legs that does not cause any worsening of her wounds in general. Amy Moses, Amy Moses ([id number]) We will see patient back for reevaluation in 1 week here in the clinic. If anything worsens or changes patient will contact our office for additional recommendations. Electronic Signature(s) Signed: 05/03/2021 5:45:41 PM By: Worthy Keeler PA-C Previous Signature: 05/03/2021 5:44:13 PM Version By: Worthy Keeler PA-C Entered By: Worthy Keeler on 05/03/2021 17:45:40 Amy Moses ([id number]) -------------------------------------------------------------------------------- SuperBill Details Patient Name: Amy Moses Date of Service: 05/03/2021 Medical Record Number:  [id number] Patient Account Number: [id number] Date of Birth/Sex: July 08, 1971 (49 y.o. 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 L89.610 Pressure ulcer of right heel, unstageable E11.42 Type 2 diabetes mellitus with diabetic polyneuropathy L89.313 Pressure  ulcer of right buttock, stage 3 L89.323 Pressure ulcer of left buttock, stage 3 L97.822 Non-pressure chronic ulcer of other part of left lower leg with fat layer exposed L97.812 Non-pressure chronic ulcer of other part of right lower leg with fat layer exposed Facility Procedures CPT4 Code: [id number] Description: 11042 - DEB SUBQ TISSUE 20 SQ CM/< Modifier: Quantity: 1 CPT4 Code: Description: ICD-10 Diagnosis Description L89.610 Pressure ulcer of right heel, unstageable L97.822 Non-pressure chronic ulcer of other part of left lower leg with fat laye L97.812 Non-pressure chronic ulcer of other part of right lower leg with fat lay Modifier: r exposed er exposed Quantity: CPT4 Code: [id number] Description: 11045 - DEB SUBQ TISS EA ADDL 20CM Modifier: Quantity: 1 CPT4 Code: Description: ICD-10 Diagnosis Description L89.610 Pressure ulcer of right heel, unstageable L97.822 Non-pressure chronic ulcer of other part of left lower leg with fat laye L97.812 Non-pressure chronic ulcer of other part of right lower leg with fat lay Modifier: r exposed er exposed Quantity: Physician Procedures CPT4 [id number] Description: 11042 - WC PHYS SUBQ TISS 20 SQ CM Modifier: Quantity: 1 CPT4 Code: Description: ICD-10 Diagnosis Description L89.610 Pressure ulcer of  right heel, unstageable L97.822 Non-pressure chronic ulcer of other part of left lower leg with fat layer L97.812 Non-pressure chronic ulcer of other part of right lower leg with fat  laye Modifier: exposed r exposed Quantity: CPT4 [id number] Description: 11045 - WC PHYS SUBQ TISS EA ADDL 20 CM Modifier: Quantity: 1 CPT4 Code: Description: ICD-10 Diagnosis Description L89.610 Pressure ulcer of right heel, unstageable L97.822 Non-pressure chronic ulcer of other part of left lower leg with fat layer L97.812 Non-pressure chronic ulcer of other part of right lower leg with fat  laye Modifier: exposed r exposed Quantity: Electronic Signature(s) Signed: 05/03/2021 5:46:01 PM By: Worthy Keeler PA-C Entered By: Worthy Keeler on 05/03/2021 17:46:01

## 2021-05-06 ENCOUNTER — Encounter: Payer: Self-pay | Admitting: Gastroenterology

## 2021-05-06 NOTE — Progress Notes (Signed)
Amy Moses, Amy Moses ([id number]) Visit Report for 05/03/2021 Arrival Information Details Patient Name: Amy Moses, Amy Moses Date of Service: 05/03/2021 1:15 PM Medical Record Number: [id number] Patient Account Number: [id number] Date of Birth/Sex: 06-Dec-1970 (50 y.o. F) Treating RN: Donnamarie Poag Primary Care Lajoyce Tamura: Zenon Mayo Other Clinician: Referring Colene Mines: Zenon Mayo Treating Suliman Termini/Extender: Skipper Cliche in Treatment: 21 Visit Information History Since Last Visit Added or deleted any medications: Yes Patient Arrived: Stretcher Had a fall or experienced change in No Arrival Time: 13:01 activities of daily living that may affect Accompanied By: self risk of falls: Transfer Assistance: [place] since last visit: Yes Patient Requires Transmission-Based No Pain Present Now: No Precautions: Patient Has Alerts: Yes Patient Alerts: Patient on Blood Thinner ASPIRIN Electronic Signature(s) Signed: 05/03/2021 1:34:03 PM By: Donnamarie Poag Entered By: Donnamarie Poag on 05/03/2021 13:34:03 Amy Moses ([id number]) -------------------------------------------------------------------------------- Clinic Level of Care Assessment Details Patient Name: Amy Moses Date of Service: 05/03/2021 1:15 PM Medical Record Number: [id number] Patient Account Number: [id number] Date of Birth/Sex: 24-May-1971 (49 y.o. F) Treating RN: Donnamarie Poag Primary Care Breshae Belcher: Zenon Mayo Other Clinician: Referring Tiyanna Larcom: Zenon Mayo Treating Demaya Hardge/Extender: Skipper Cliche in Treatment: 21 Clinic Level of Care Assessment Items TOOL 1 Quantity Score '[]'$  - Use when EandM and Procedure is performed on INITIAL visit 0 ASSESSMENTS - Nursing Assessment / Reassessment '[]'$  - General Physical Exam (combine w/ comprehensive assessment (listed just below) when performed on new 0 pt. evals) '[]'$  - 0 Comprehensive Assessment (HX, ROS, Risk Assessments, Wounds Hx,  etc.) ASSESSMENTS - Wound and Skin Assessment / Reassessment '[]'$  - Dermatologic / Skin Assessment (not related to wound area) 0 ASSESSMENTS - Ostomy and/or Continence Assessment and Care '[]'$  - Incontinence Assessment and Management 0 '[]'$  - 0 Ostomy Care Assessment and Management (repouching, etc.) PROCESS - Coordination of Care '[]'$  - Simple Patient / Family Education for ongoing care 0 '[]'$  - 0 Complex (extensive) Patient / Family Education for ongoing care '[]'$  - 0 Staff obtains Programmer, systems, Records, Test Results / Process Orders '[]'$  - 0 Staff telephones HHA, Nursing Homes / Clarify orders / etc '[]'$  - 0 Routine Transfer to another Facility (non-emergent condition) '[]'$  - 0 Routine Hospital Admission (non-emergent condition) '[]'$  - 0 New Admissions / Biomedical engineer / Ordering NPWT, Apligraf, etc. '[]'$  - 0 Emergency Hospital Admission (emergent condition) PROCESS - Special Needs '[]'$  - Pediatric / Minor Patient Management 0 '[]'$  - 0 Isolation Patient Management '[]'$  - 0 Hearing / Language / Visual special needs '[]'$  - 0 Assessment of Community assistance (transportation, D/C planning, etc.) '[]'$  - 0 Additional assistance / Altered mentation '[]'$  - 0 Support Surface(s) Assessment (bed, cushion, seat, etc.) INTERVENTIONS - Miscellaneous '[]'$  - External ear exam 0 '[]'$  - 0 Patient Transfer (multiple staff / Civil Service fast streamer / Similar devices) '[]'$  - 0 Simple Staple / Suture removal (25 or less) '[]'$  - 0 Complex Staple / Suture removal (26 or more) '[]'$  - 0 Hypo/Hyperglycemic Management (do not check if billed separately) '[]'$  - 0 Ankle / Brachial Index (ABI) - do not check if billed separately Has the patient been seen at the hospital within the last three years: Yes Total Score: 0 Level Of Care: ____ Amy Moses ([id number]) Electronic Signature(s) Signed: 05/03/2021 2:07:30 PM By: Donnamarie Poag Entered By: Donnamarie Poag on 05/03/2021 14:06:35 Amy Moses  ([id number]) -------------------------------------------------------------------------------- Lower Extremity Assessment Details Patient Name: Amy Moses Date of Service: 05/03/2021 1:15 PM Medical Record Number: [id number] Patient Account Number: [id number] Date of Birth/Sex: 04-Jul-1971 (49 y.o. F) Treating RN:  Donnamarie Poag Primary Care Reatha Sur: Zenon Mayo Other Clinician: Referring Shunda Rabadi: Zenon Mayo Treating Shariya Gaster/Extender: Jeri Cos Weeks in Treatment: 21 Edema Assessment Assessed: [Left: Yes] [Right: Yes] [Left: Edema] [Right: :] Vascular Assessment Pulses: Dorsalis Pedis Palpable: [Left:No Inaudible] [Right:No Inaudible] Electronic Signature(s) Signed: 05/03/2021 2:07:30 PM By: Donnamarie Poag Entered By: Donnamarie Poag on 05/03/2021 13:31:05 Amy Moses ([id number]) -------------------------------------------------------------------------------- Multi Wound Chart Details Patient Name: Amy Moses Date of Service: 05/03/2021 1:15 PM Medical Record Number: [id number] Patient Account Number: [id number] Date of Birth/Sex: 12/24/1970 (49 y.o. F) Treating RN: Donnamarie Poag Primary Care Zykiria Bruening: Zenon Mayo Other Clinician: Referring Carola Viramontes: Zenon Mayo Treating Andrena Margerum/Extender: Skipper Cliche in Treatment: 21 Vital Signs Height(in): 67 Pulse(bpm): 58 Weight(lbs): 235 Blood Pressure(mmHg): 157/82 Body Mass Index(BMI): 38 Temperature(F): 97.8 Respiratory Rate(breaths/min): 18 Photos: [1:No Photos] [10:No Photos] [11:No Photos] Wound Location: [1:Right, Medial Upper Leg] [10:Right, Lateral Calf] [11:Right, Distal, Posterior Lower Leg] Wounding Event: [1:Gradually Appeared] [10:Gradually Appeared] [11:Gradually Appeared] Primary Etiology: [1:Calciphylaxis] [10:Calciphylaxis] [11:Calciphylaxis] Comorbid History: [1:N/A] [10:Cataracts, Glaucoma, Anemia, Hypertension, Type II Diabetes, End Hypertension, Type II Diabetes, End Stage Renal Disease,  Neuropathy] [11:Cataracts, Glaucoma, Anemia, Stage Renal Disease, Neuropathy] Date Acquired: [1:07/22/2020] [10:02/16/2021] [11:03/21/2021] Weeks of Treatment: [1:21] [10:10] [11:4] Wound Status: [1:Open] [10:Open] [11:Open] Clustered Wound: [1:No] [10:No] [11:No] Measurements L x W x D (cm) [1:6.2x15x0.4] [10:2.2x1.4x0.4] [11:6.8x4.5x0.1] Area (cm) : [1:73.042] [10:2.419] [11:24.033] Volume (cm) : [1:29.217] [10:0.968] [11:2.403] % Reduction in Area: [1:67.50%] [10:-12.00%] [11:-512.00%] % Reduction in Volume: [1:-30.10%] [10:-348.10%] [11:-511.50%] Classification: [1:Full Thickness Without Exposed Support Structures] [10:Full Thickness Without Exposed Support Structures] [11:Full Thickness Without Exposed Support Structures] Exudate Amount: [1:N/A] [10:Medium] [11:None Present] Exudate Type: [1:N/A] [10:Serosanguineous] [11:N/A] Exudate Color: [1:N/A] [10:red, brown] [11:N/A] Granulation Amount: [1:N/A] [10:Small (1-33%)] [11:None Present (0%)] Granulation Quality: [1:N/A] [10:Pink] [11:N/A] Necrotic Amount: [1:N/A] [10:Large (67-100%)] [11:Large (67-100%)] Necrotic Tissue: [1:N/A N/A] [10:Adherent Slough None] [11:Eschar None] Wound Number: '2 3 4 '$ Photos: No Photos No Photos No Photos Wound Location: Left, Medial Upper Leg Left, Lateral Upper Leg Right Calcaneus Wounding Event: Gradually Appeared Gradually Appeared Gradually Appeared Primary Etiology: Calciphylaxis Calciphylaxis Pressure Ulcer Comorbid History: Cataracts, Glaucoma, Anemia, Cataracts, Glaucoma, Anemia, Cataracts, Glaucoma, Anemia, Hypertension, Type II Diabetes, End Hypertension, Type II Diabetes, End Hypertension, Type II Diabetes, End Stage Renal Disease, Neuropathy Stage Renal Disease, Neuropathy Stage Renal Disease, Neuropathy Date Acquired: 07/22/2020 07/22/2020 10/21/2020 Weeks of Treatment: '21 21 21 '$ Wound Status: Open Open Open Clustered Wound: No No No Measurements L x W x D (cm) 10.3x2x0.2 1.4x7.3x0.4  3.7x3.4x0.5 Area (cm) : 16.179 8.027 9.88 Volume (cm) : 3.236 3.211 4.94 % Reduction in Area: 86.80% 68.10% 65.10% % Reduction in Volume: 73.60% -27.80% -74.70% Classification: Full Thickness Without Exposed Full Thickness Without Exposed Unstageable/Unclassified Support Structures Support Structures Exudate Amount: Medium Medium Large Exudate Type: Serosanguineous Serosanguineous Serosanguineous Exudate Color: red, brown red, brown red, brown Granulation Amount: Large (67-100%) Large (67-100%) Small (1-33%) Granulation Quality: Red Red Red Necrotic Amount: Small (1-33%) Small (1-33%) Large (67-100%) [place], Emmalia ([id number]) Necrotic Tissue: N/A N/A Adherent Slough Exposed Structures: Fat Layer (Subcutaneous Tissue): Fat Layer (Subcutaneous Tissue): Fat Layer (Subcutaneous Tissue): Yes Yes Yes Fascia: No Fascia: No Fascia: No Tendon: No Tendon: No Tendon: No Muscle: No Muscle: No Muscle: No Joint: No Joint: No Joint: No Bone: No Bone: No Bone: No Epithelialization: Small (1-33%) Small (1-33%) None Wound Number: '6 8 9 '$ Photos: No Photos No Photos No Photos Wound Location: Right Gluteus Left Gluteus Left, Lateral Calf Wounding Event: Gradually Appeared Gradually Appeared Gradually Appeared Primary Etiology: Pressure Ulcer  Pressure Ulcer Calciphylaxis Comorbid History: N/A N/A Cataracts, Glaucoma, Anemia, Hypertension, Type II Diabetes, End Stage Renal Disease, Neuropathy Date Acquired: 12/22/2020 12/22/2020 02/16/2021 Weeks of Treatment: '15 15 10 '$ Wound Status: Healed - Epithelialized Healed - Epithelialized Open Clustered Wound: Yes No No Measurements L x W x D (cm) 0x0x0 0x0x0 10.3x3.4x0.9 Area (cm) : 0 0 27.505 Volume (cm) : 0 0 24.754 % Reduction in Area: 100.00% 100.00% -297.90% % Reduction in Volume: 100.00% 100.00% -3482.30% Classification: Category/Stage III Category/Stage III Full Thickness Without Exposed Support Structures Exudate Amount: N/A N/A  Large Exudate Type: N/A N/A Serosanguineous Exudate Color: N/A N/A red, brown Granulation Amount: N/A N/A Small (1-33%) Granulation Quality: N/A N/A Pink Necrotic Amount: N/A N/A Large (67-100%) Necrotic Tissue: N/A N/A Eschar, Adherent Slough Exposed Structures: N/A N/A Fat Layer (Subcutaneous Tissue): Yes Fascia: No Tendon: No Muscle: No Joint: No Bone: No Epithelialization: N/A N/A None Treatment Notes Electronic Signature(s) Signed: 05/03/2021 2:07:30 PM By: Donnamarie Poag Entered By: Donnamarie Poag on 05/03/2021 13:31:27 Amy Moses ([id number]) -------------------------------------------------------------------------------- Multi-Disciplinary Care Plan Details Patient Name: Amy Moses Date of Service: 05/03/2021 1:15 PM Medical Record Number: [id number] Patient Account Number: [id number] Date of Birth/Sex: 1971/08/03 (49 y.o. F) Treating RN: Donnamarie Poag Primary Care Eliazer Hemphill: Zenon Mayo Other Clinician: Referring Amery Minasyan: Zenon Mayo Treating Melissia Lahman/Extender: Skipper Cliche in Treatment: 21 Active Inactive Electronic Signature(s) Signed: 05/03/2021 2:07:30 PM By: Donnamarie Poag Entered By: Donnamarie Poag on 05/03/2021 13:31:14 Amy Moses ([id number]) -------------------------------------------------------------------------------- Pain Assessment Details Patient Name: Amy Moses Date of Service: 05/03/2021 1:15 PM Medical Record Number: [id number] Patient Account Number: [id number] Date of Birth/Sex: 01-24-1971 (49 y.o. F) Treating RN: Donnamarie Poag Primary Care Caleb Decock: Zenon Mayo Other Clinician: Referring Fraya Ueda: Zenon Mayo Treating Tiena Manansala/Extender: Skipper Cliche in Treatment: 21 Active Problems Location of Pain Severity and Description of Pain Patient Has Paino No Site Locations Rate the pain. Current Pain Level: 0 Pain Management and Medication Current Pain Management: Electronic Signature(s) Signed: 05/03/2021 1:05:09  PM By: Donnamarie Poag Signed: 05/06/2021 3:50:18 PM By: Jeanine Luz Entered By: Jeanine Luz on 05/03/2021 13:04:11 Amy Moses ([id number]) -------------------------------------------------------------------------------- Patient/Caregiver Education Details Patient Name: Amy Moses Date of Service: 05/03/2021 1:15 PM Medical Record Number: [id number] Patient Account Number: [id number] Date of Birth/Gender: 05/06/1971 (50 y.o. F) Treating RN: Donnamarie Poag Primary Care Physician: Zenon Mayo Other Clinician: Referring Physician: Zenon Mayo Treating Physician/Extender: Skipper Cliche in Treatment: 21 Education Assessment Education Provided To: Patient Education Topics Provided Basic Hygiene: Methods: Explain/Verbal Responses: State content correctly Infection: Methods: Explain/Verbal Responses: State content correctly Wound Debridement: Methods: Explain/Verbal Responses: State content correctly Wound/Skin Impairment: Methods: Explain/Verbal Responses: State content correctly Electronic Signature(s) Signed: 05/03/2021 2:07:30 PM By: Donnamarie Poag Entered By: Donnamarie Poag on 05/03/2021 14:07:08 Amy Moses ([id number]) -------------------------------------------------------------------------------- Wound Assessment Details Patient Name: Amy Moses Date of Service: 05/03/2021 1:15 PM Medical Record Number: [id number] Patient Account Number: [id number] Date of Birth/Sex: [birth year] (49 y.o. F) Treating RN: Donnamarie Poag Primary Care Jolyssa Oplinger: Zenon Mayo Other Clinician: Referring Shizuo Biskup: Zenon Mayo Treating Hephzibah Strehle/Extender: Skipper Cliche in Treatment: 21 Wound Status Wound Number: 1 Primary Etiology: Calciphylaxis Wound Location: Right, Medial Upper Leg Wound Status: Open Wounding Event: Gradually Appeared Date Acquired: 07/22/2020 Weeks Of Treatment: 21 Clustered Wound: No Photos Photo Uploaded By: Georges Mouse, Minus Breeding on  05/03/2021 13:35:40 Wound Measurements Length: (cm) 6.2 Width: (cm) 15 Depth: (cm) 0.4 Area: (cm) 73.042 Volume: (cm) 29.217 % Reduction in Area: 67.5% % Reduction in Volume: -30.1% Wound Description Classification: Full Thickness Without Exposed Support Structu  res Electronic Signature(s) Signed: 05/03/2021 2:07:30 PM By: Donnamarie Poag Signed: 05/06/2021 3:50:18 PM By: Jeanine Luz Entered By: Jeanine Luz on 05/03/2021 13:21:39 Amy Moses ([id number]) -------------------------------------------------------------------------------- Wound Assessment Details Patient Name: Amy Moses Date of Service: 05/03/2021 1:15 PM Medical Record Number: [id number] Patient Account Number: [id number] Date of Birth/Sex: [birth year] (49 y.o. F) Treating RN: Donnamarie Poag Primary Care Britten Parady: Zenon Mayo Other Clinician: Referring Kentley Cedillo: Zenon Mayo Treating Leandria Thier/Extender: Skipper Cliche in Treatment: 21 Wound Status Wound Number: 10 Primary Calciphylaxis Etiology: Wound Location: Right, Lateral Calf Wound Open Wounding Event: Gradually Appeared Status: Date Acquired: 02/16/2021 Comorbid Cataracts, Glaucoma, Anemia, Hypertension, Type II Weeks Of Treatment: 10 History: Diabetes, End Stage Renal Disease, Neuropathy Clustered Wound: No Photos Photo Uploaded By: Georges Mouse, Minus Breeding on 05/03/2021 13:36:04 Wound Measurements Length: (cm) 2.2 Width: (cm) 1.4 Depth: (cm) 0.4 Area: (cm) 2.419 Volume: (cm) 0.968 % Reduction in Area: -12% % Reduction in Volume: -348.1% Epithelialization: None Tunneling: No Undermining: No Wound Description Classification: Full Thickness Without Exposed Support Structu Exudate Amount: Medium Exudate Type: Serosanguineous Exudate Color: red, brown res Foul Odor After Cleansing: No Slough/Fibrino No Wound Bed Granulation Amount: Small (1-33%) Exposed Structure Granulation Quality: Pink Fascia Exposed: No Necrotic  Amount: Large (67-100%) Fat Layer (Subcutaneous Tissue) Exposed: Yes Necrotic Quality: Adherent Slough Tendon Exposed: No Muscle Exposed: No Joint Exposed: No Bone Exposed: No Electronic Signature(s) Signed: 05/03/2021 2:07:30 PM By: Donnamarie Poag Signed: 05/06/2021 3:50:18 PM By: Jeanine Luz Entered By: Jeanine Luz on 05/03/2021 13:10:48 Amy Moses ([id number]) -------------------------------------------------------------------------------- Wound Assessment Details Patient Name: Amy Moses Date of Service: 05/03/2021 1:15 PM Medical Record Number: [id number] Patient Account Number: [id number] Date of Birth/Sex: [birth year] (50 y.o. F) Treating RN: Donnamarie Poag Primary Care Riel Hirschman: Zenon Mayo Other Clinician: Referring Shara Hartis: Zenon Mayo Treating Delainie Chavana/Extender: Skipper Cliche in Treatment: 21 Wound Status Wound Number: 11 Primary Calciphylaxis Etiology: Wound Location: Right, Distal, Posterior Lower Leg Wound Open Wounding Event: Gradually Appeared Status: Date Acquired: 03/21/2021 Comorbid Cataracts, Glaucoma, Anemia, Hypertension, Type II Weeks Of Treatment: 4 History: Diabetes, End Stage Renal Disease, Neuropathy Clustered Wound: No Photos Photo Uploaded By: Georges Mouse, Minus Breeding on 05/03/2021 13:36:22 Wound Measurements Length: (cm) 6.8 Width: (cm) 4.5 Depth: (cm) 0.1 Area: (cm) 24.033 Volume: (cm) 2.403 % Reduction in Area: -512% % Reduction in Volume: -511.5% Epithelialization: None Tunneling: No Undermining: No Wound Description Classification: Full Thickness Without Exposed Support Structure Exudate Amount: None Present s Foul Odor After Cleansing: No Slough/Fibrino No Wound Bed Granulation Amount: None Present (0%) Exposed Structure Necrotic Amount: Large (67-100%) Fascia Exposed: No Necrotic Quality: Eschar Fat Layer (Subcutaneous Tissue) Exposed: Yes Tendon Exposed: No Muscle Exposed: No Joint Exposed: No Bone  Exposed: No Electronic Signature(s) Signed: 05/03/2021 2:07:30 PM By: Donnamarie Poag Signed: 05/06/2021 3:50:18 PM By: Jeanine Luz Entered By: Jeanine Luz on 05/03/2021 13:12:41 Amy Moses ([id number]) -------------------------------------------------------------------------------- Wound Assessment Details Patient Name: Amy Moses Date of Service: 05/03/2021 1:15 PM Medical Record Number: [id number] Patient Account Number: [id number] Date of Birth/Sex: April 21, 1971 (50 y.o. F) Treating RN: Carlene Coria Primary Care Ahmet Schank: Zenon Mayo Other Clinician: Referring Williom Cedar: Zenon Mayo Treating Cayton Cuevas/Extender: Skipper Cliche in Treatment: 21 Wound Status Wound Number: 12 Primary Pressure Ulcer Etiology: Wound Location: Left, Lateral Calcaneus Wound Open Wounding Event: Gradually Appeared Status: Date Acquired: 04/21/2021 Comorbid Cataracts, Glaucoma, Anemia, Hypertension, Type II Weeks Of Treatment: 0 History: Diabetes, End Stage Renal Disease, Neuropathy Clustered Wound: No Photos Wound Measurements Length: (cm) 2 Width: (cm) 3 Depth: (cm) 0.1 Area: (cm) 4.712 Volume: (cm)  0.471 % Reduction in Area: % Reduction in Volume: Epithelialization: None Tunneling: No Undermining: No Wound Description Classification: Unstageable/Unclassified Exudate Amount: None Present Foul Odor After Cleansing: No Slough/Fibrino Yes Wound Bed Granulation Amount: None Present (0%) Exposed Structure Necrotic Amount: Large (67-100%) Fascia Exposed: No Necrotic Quality: Eschar Fat Layer (Subcutaneous Tissue) Exposed: No Tendon Exposed: No Muscle Exposed: No Joint Exposed: No Bone Exposed: No Electronic Signature(s) Signed: 05/05/2021 3:34:38 PM By: Carlene Coria RN Entered By: Carlene Coria on 05/03/2021 14:34:00 Amy Moses ([id number]) -------------------------------------------------------------------------------- Wound Assessment Details Patient Name:  Amy Moses Date of Service: 05/03/2021 1:15 PM Medical Record Number: [id number] Patient Account Number: [id number] Date of Birth/Sex: [birth year] (50 y.o. F) Treating RN: Donnamarie Poag Primary Care Naidelin Gugliotta: Zenon Mayo Other Clinician: Referring Jann Milkovich: Zenon Mayo Treating Redmond Whittley/Extender: Skipper Cliche in Treatment: 21 Wound Status Wound Number: 2 Primary Calciphylaxis Etiology: Wound Location: Left, Medial Upper Leg Wound Open Wounding Event: Gradually Appeared Status: Date Acquired: 07/22/2020 Comorbid Cataracts, Glaucoma, Anemia, Hypertension, Type II Weeks Of Treatment: 21 History: Diabetes, End Stage Renal Disease, Neuropathy Clustered Wound: No Photos Photo Uploaded By: Georges Mouse, Minus Breeding on 05/03/2021 13:36:37 Wound Measurements Length: (cm) 10.3 Width: (cm) 2 Depth: (cm) 0.2 Area: (cm) 16.179 Volume: (cm) 3.236 % Reduction in Area: 86.8% % Reduction in Volume: 73.6% Epithelialization: Small (1-33%) Tunneling: No Undermining: No Wound Description Classification: Full Thickness Without Exposed Support Structu Exudate Amount: Medium Exudate Type: Serosanguineous Exudate Color: red, brown res Foul Odor After Cleansing: No Slough/Fibrino Yes Wound Bed Granulation Amount: Large (67-100%) Exposed Structure Granulation Quality: Red Fascia Exposed: No Necrotic Amount: Small (1-33%) Fat Layer (Subcutaneous Tissue) Exposed: Yes Tendon Exposed: No Muscle Exposed: No Joint Exposed: No Bone Exposed: No Electronic Signature(s) Signed: 05/03/2021 2:07:30 PM By: Donnamarie Poag Signed: 05/06/2021 3:50:18 PM By: Jeanine Luz Entered By: Jeanine Luz on 05/03/2021 13:25:19 Amy Moses ([id number]) -------------------------------------------------------------------------------- Wound Assessment Details Patient Name: Amy Moses Date of Service: 05/03/2021 1:15 PM Medical Record Number: [id number] Patient Account Number: [id number] Date of  Birth/Sex: 1970/12/09 (50 y.o. F) Treating RN: Donnamarie Poag Primary Care Jakarri Lesko: Zenon Mayo Other Clinician: Referring Deneisha Dade: Zenon Mayo Treating Ashonte Angelucci/Extender: Skipper Cliche in Treatment: 21 Wound Status Wound Number: 3 Primary Calciphylaxis Etiology: Wound Location: Left, Lateral Upper Leg Wound Open Wounding Event: Gradually Appeared Status: Date Acquired: 07/22/2020 Comorbid Cataracts, Glaucoma, Anemia, Hypertension, Type II Weeks Of Treatment: 21 History: Diabetes, End Stage Renal Disease, Neuropathy Clustered Wound: No Photos Photo Uploaded By: Georges Mouse, Minus Breeding on 05/03/2021 13:37:18 Wound Measurements Length: (cm) 1.4 Width: (cm) 7.3 Depth: (cm) 0.4 Area: (cm) 8.027 Volume: (cm) 3.211 % Reduction in Area: 68.1% % Reduction in Volume: -27.8% Epithelialization: Small (1-33%) Tunneling: No Undermining: No Wound Description Classification: Full Thickness Without Exposed Support Structu Exudate Amount: Medium Exudate Type: Serosanguineous Exudate Color: red, brown res Foul Odor After Cleansing: No Slough/Fibrino Yes Wound Bed Granulation Amount: Large (67-100%) Exposed Structure Granulation Quality: Red Fascia Exposed: No Necrotic Amount: Small (1-33%) Fat Layer (Subcutaneous Tissue) Exposed: Yes Tendon Exposed: No Muscle Exposed: No Joint Exposed: No Bone Exposed: No Electronic Signature(s) Signed: 05/03/2021 2:07:30 PM By: Donnamarie Poag Signed: 05/06/2021 3:50:18 PM By: Jeanine Luz Entered By: Jeanine Luz on 05/03/2021 13:25:42 Amy Moses ([id number]) -------------------------------------------------------------------------------- Wound Assessment Details Patient Name: Amy Moses Date of Service: 05/03/2021 1:15 PM Medical Record Number: [id number] Patient Account Number: [id number] Date of Birth/Sex: 1971-03-05 (50 y.o. F) Treating RN: Donnamarie Poag Primary Care Mayco Walrond: Zenon Mayo Other Clinician: Referring  Yousof Alderman: Zenon Mayo Treating Yedidya Duddy/Extender: Skipper Cliche in  Treatment: 21 Wound Status Wound Number: 4 Primary Pressure Ulcer Etiology: Wound Location: Right Calcaneus Wound Open Wounding Event: Gradually Appeared Status: Date Acquired: 10/21/2020 Comorbid Cataracts, Glaucoma, Anemia, Hypertension, Type II Weeks Of Treatment: 21 History: Diabetes, End Stage Renal Disease, Neuropathy Clustered Wound: No Photos Photo Uploaded By: Georges Mouse, Minus Breeding on 05/03/2021 13:36:48 Wound Measurements Length: (cm) 3.7 Width: (cm) 3.4 Depth: (cm) 0.5 Area: (cm) 9.88 Volume: (cm) 4.94 % Reduction in Area: 65.1% % Reduction in Volume: -74.7% Epithelialization: None Tunneling: No Undermining: No Wound Description Classification: Unstageable/Unclassified Exudate Amount: Large Exudate Type: Serosanguineous Exudate Color: red, brown Foul Odor After Cleansing: No Slough/Fibrino Yes Wound Bed Granulation Amount: Small (1-33%) Exposed Structure Granulation Quality: Red Fascia Exposed: No Necrotic Amount: Large (67-100%) Fat Layer (Subcutaneous Tissue) Exposed: Yes Necrotic Quality: Adherent Slough Tendon Exposed: No Muscle Exposed: No Joint Exposed: No Bone Exposed: No Electronic Signature(s) Signed: 05/03/2021 2:07:30 PM By: Donnamarie Poag Signed: 05/06/2021 3:50:18 PM By: Jeanine Luz Entered By: Jeanine Luz on 05/03/2021 13:09:03 Amy Moses ([id number]) -------------------------------------------------------------------------------- Wound Assessment Details Patient Name: Amy Moses Date of Service: 05/03/2021 1:15 PM Medical Record Number: [id number] Patient Account Number: [id number] Date of Birth/Sex: 12-26-1970 (50 y.o. F) Treating RN: Donnamarie Poag Primary Care Brandin Dilday: Zenon Mayo Other Clinician: Referring Juanpablo Ciresi: Zenon Mayo Treating Ranyia Witting/Extender: Skipper Cliche in Treatment: 21 Wound Status Wound Number: 6 Primary  Etiology: Pressure Ulcer Wound Location: Right Gluteus Wound Status: Healed - Epithelialized Wounding Event: Gradually Appeared Date Acquired: 12/22/2020 Weeks Of Treatment: 15 Clustered Wound: Yes Wound Measurements Length: (cm) 0 Width: (cm) 0 Depth: (cm) 0 Area: (cm) 0 Volume: (cm) 0 % Reduction in Area: 100% % Reduction in Volume: 100% Wound Description Classification: Category/Stage III Electronic Signature(s) Signed: 05/03/2021 2:07:30 PM By: Donnamarie Poag Signed: 05/06/2021 3:50:18 PM By: Jeanine Luz Entered By: Jeanine Luz on 05/03/2021 13:28:03 Amy Moses ([id number]) -------------------------------------------------------------------------------- Wound Assessment Details Patient Name: Amy Moses Date of Service: 05/03/2021 1:15 PM Medical Record Number: [id number] Patient Account Number: [id number] Date of Birth/Sex: 1971-05-18 (49 y.o. F) Treating RN: Donnamarie Poag Primary Care Rondy Krupinski: Zenon Mayo Other Clinician: Referring Alexsis Branscom: Zenon Mayo Treating Jiles Goya/Extender: Skipper Cliche in Treatment: 21 Wound Status Wound Number: 8 Primary Etiology: Pressure Ulcer Wound Location: Left Gluteus Wound Status: Healed - Epithelialized Wounding Event: Gradually Appeared Date Acquired: 12/22/2020 Weeks Of Treatment: 15 Clustered Wound: No Wound Measurements Length: (cm) 0 Width: (cm) 0 Depth: (cm) 0 Area: (cm) 0 Volume: (cm) 0 % Reduction in Area: 100% % Reduction in Volume: 100% Wound Description Classification: Category/Stage III Electronic Signature(s) Signed: 05/03/2021 2:07:30 PM By: Donnamarie Poag Signed: 05/06/2021 3:50:18 PM By: Jeanine Luz Entered By: Jeanine Luz on 05/03/2021 13:28:03 Amy Moses ([id number]) -------------------------------------------------------------------------------- Wound Assessment Details Patient Name: Amy Moses Date of Service: 05/03/2021 1:15 PM Medical Record Number:  [id number] Patient Account Number: [id number] Date of Birth/Sex: 27-Aug-1971 (49 y.o. F) Treating RN: Donnamarie Poag Primary Care Tiahna Cure: Zenon Mayo Other Clinician: Referring Amorette Charrette: Zenon Mayo Treating Coulton Schlink/Extender: Skipper Cliche in Treatment: 21 Wound Status Wound Number: 9 Primary Calciphylaxis Etiology: Wound Location: Left, Lateral Calf Wound Open Wounding Event: Gradually Appeared Status: Date Acquired: 02/16/2021 Comorbid Cataracts, Glaucoma, Anemia, Hypertension, Type II Weeks Of Treatment: 10 History: Diabetes, End Stage Renal Disease, Neuropathy Clustered Wound: No Photos Photo Uploaded By: Georges Mouse, Minus Breeding on 05/03/2021 13:37:49 Wound Measurements Length: (cm) 10.3 Width: (cm) 3.4 Depth: (cm) 0.9 Area: (cm) 27.505 Volume: (cm) 24.754 % Reduction in Area: -297.9% % Reduction in Volume: -3482.3% Epithelialization: None Tunneling: No Undermining:  No Wound Description Classification: Full Thickness Without Exposed Support Structu Exudate Amount: Large Exudate Type: Serosanguineous Exudate Color: red, brown res Foul Odor After Cleansing: No Slough/Fibrino Yes Wound Bed Granulation Amount: Small (1-33%) Exposed Structure Granulation Quality: Pink Fascia Exposed: No Necrotic Amount: Large (67-100%) Fat Layer (Subcutaneous Tissue) Exposed: Yes Necrotic Quality: Eschar, Adherent Slough Tendon Exposed: No Muscle Exposed: No Joint Exposed: No Bone Exposed: No Electronic Signature(s) Signed: 05/03/2021 2:07:30 PM By: Donnamarie Poag Signed: 05/06/2021 3:50:18 PM By: Jeanine Luz Entered By: Jeanine Luz on 05/03/2021 13:14:47 Amy Moses ([id number]) -------------------------------------------------------------------------------- Vitals Details Patient Name: Amy Moses Date of Service: 05/03/2021 1:15 PM Medical Record Number: [id number] Patient Account Number: [id number] Date of Birth/Sex: [birth year] (50 y.o. F) Treating RN:  Donnamarie Poag Primary Care Dakiya Puopolo: Zenon Mayo Other Clinician: Referring Kween Bacorn: Zenon Mayo Treating Jermiah Soderman/Extender: Skipper Cliche in Treatment: 21 Vital Signs Time Taken: 12:59 Temperature (F): 97.8 Height (in): 66 Pulse (bpm): 81 Weight (lbs): 235 Respiratory Rate (breaths/min): 18 Body Mass Index (BMI): 37.9 Blood Pressure (mmHg): 157/82 Reference Range: 80 - 120 mg / dl Electronic Signature(s) Signed: 05/06/2021 3:50:18 PM By: Jeanine Luz Entered By: Jeanine Luz on 05/03/2021 13:04:03

## 2021-05-07 ENCOUNTER — Ambulatory Visit: Payer: Medicaid - Out of State | Admitting: Physician Assistant

## 2021-05-10 ENCOUNTER — Encounter (HOSPITAL_COMMUNITY): Payer: Self-pay | Admitting: *Deleted

## 2021-05-10 ENCOUNTER — Observation Stay (HOSPITAL_BASED_OUTPATIENT_CLINIC_OR_DEPARTMENT_OTHER)
Admission: EM | Admit: 2021-05-10 | Discharge: 2021-05-11 | Disposition: A | Discharge status: Home / Self Care | Payer: Medicare Other | Source: Home / Self Care | Attending: Emergency Medicine | Admitting: Emergency Medicine

## 2021-05-10 ENCOUNTER — Other Ambulatory Visit: Payer: Self-pay

## 2021-05-10 ENCOUNTER — Telehealth: Payer: Self-pay | Admitting: Internal Medicine

## 2021-05-10 DIAGNOSIS — B029 Zoster without complications: Secondary | ICD-10-CM

## 2021-05-10 DIAGNOSIS — E1122 Type 2 diabetes mellitus with diabetic chronic kidney disease: Secondary | ICD-10-CM

## 2021-05-10 DIAGNOSIS — Z79899 Other long term (current) drug therapy: Secondary | ICD-10-CM | POA: Insufficient documentation

## 2021-05-10 DIAGNOSIS — I10 Essential (primary) hypertension: Secondary | ICD-10-CM

## 2021-05-10 DIAGNOSIS — K91840 Postprocedural hemorrhage and hematoma of a digestive system organ or structure following a digestive system procedure: Secondary | ICD-10-CM | POA: Diagnosis not present

## 2021-05-10 DIAGNOSIS — IMO0002 Reserved for concepts with insufficient information to code with codable children: Secondary | ICD-10-CM

## 2021-05-10 DIAGNOSIS — Z7982 Long term (current) use of aspirin: Secondary | ICD-10-CM | POA: Insufficient documentation

## 2021-05-10 DIAGNOSIS — E118 Type 2 diabetes mellitus with unspecified complications: Secondary | ICD-10-CM

## 2021-05-10 DIAGNOSIS — Z20822 Contact with and (suspected) exposure to covid-19: Secondary | ICD-10-CM | POA: Insufficient documentation

## 2021-05-10 DIAGNOSIS — K922 Gastrointestinal hemorrhage, unspecified: Secondary | ICD-10-CM

## 2021-05-10 DIAGNOSIS — Z992 Dependence on renal dialysis: Secondary | ICD-10-CM | POA: Insufficient documentation

## 2021-05-10 DIAGNOSIS — E1165 Type 2 diabetes mellitus with hyperglycemia: Secondary | ICD-10-CM

## 2021-05-10 DIAGNOSIS — K625 Hemorrhage of anus and rectum: Secondary | ICD-10-CM | POA: Diagnosis not present

## 2021-05-10 DIAGNOSIS — I509 Heart failure, unspecified: Secondary | ICD-10-CM | POA: Insufficient documentation

## 2021-05-10 DIAGNOSIS — Z794 Long term (current) use of insulin: Secondary | ICD-10-CM | POA: Insufficient documentation

## 2021-05-10 DIAGNOSIS — E782 Mixed hyperlipidemia: Secondary | ICD-10-CM | POA: Diagnosis present

## 2021-05-10 DIAGNOSIS — E039 Hypothyroidism, unspecified: Secondary | ICD-10-CM | POA: Insufficient documentation

## 2021-05-10 DIAGNOSIS — E119 Type 2 diabetes mellitus without complications: Secondary | ICD-10-CM

## 2021-05-10 DIAGNOSIS — H547 Unspecified visual loss: Secondary | ICD-10-CM

## 2021-05-10 DIAGNOSIS — I132 Hypertensive heart and chronic kidney disease with heart failure and with stage 5 chronic kidney disease, or end stage renal disease: Secondary | ICD-10-CM | POA: Insufficient documentation

## 2021-05-10 DIAGNOSIS — N186 End stage renal disease: Secondary | ICD-10-CM

## 2021-05-10 DIAGNOSIS — E785 Hyperlipidemia, unspecified: Secondary | ICD-10-CM | POA: Diagnosis present

## 2021-05-10 LAB — RESP PANEL BY RT-PCR (FLU A&B, COVID) ARPGX2
Influenza A by PCR: NEGATIVE
Influenza B by PCR: NEGATIVE
SARS Coronavirus 2 by RT PCR: NEGATIVE

## 2021-05-10 LAB — CBC WITH DIFFERENTIAL/PLATELET
Abs Immature Granulocytes: 0.02 10*3/uL (ref 0.00–0.07)
Basophils Absolute: 0.1 10*3/uL (ref 0.0–0.1)
Basophils Relative: 1 %
Eosinophils Absolute: 0.2 10*3/uL (ref 0.0–0.5)
Eosinophils Relative: 2 %
HCT: 33.3 % — ABNORMAL LOW (ref 36.0–46.0)
Hemoglobin: 10.1 g/dL — ABNORMAL LOW (ref 12.0–15.0)
Immature Granulocytes: 0 %
Lymphocytes Relative: 16 %
Lymphs Abs: 1.2 10*3/uL (ref 0.7–4.0)
MCH: 26.6 pg (ref 26.0–34.0)
MCHC: 30.3 g/dL (ref 30.0–36.0)
MCV: 87.9 fL (ref 80.0–100.0)
Monocytes Absolute: 0.7 10*3/uL (ref 0.1–1.0)
Monocytes Relative: 10 %
Neutro Abs: 5.2 10*3/uL (ref 1.7–7.7)
Neutrophils Relative %: 71 %
Platelets: 318 10*3/uL (ref 150–400)
RBC: 3.79 MIL/uL — ABNORMAL LOW (ref 3.87–5.11)
RDW: 16.2 % — ABNORMAL HIGH (ref 11.5–15.5)
WBC: 7.3 10*3/uL (ref 4.0–10.5)
nRBC: 0 % (ref 0.0–0.2)

## 2021-05-10 LAB — BASIC METABOLIC PANEL
Anion gap: 18 — ABNORMAL HIGH (ref 5–15)
BUN: 40 mg/dL — ABNORMAL HIGH (ref 6–20)
CO2: 26 mmol/L (ref 22–32)
Calcium: 9.6 mg/dL (ref 8.9–10.3)
Chloride: 93 mmol/L — ABNORMAL LOW (ref 98–111)
Creatinine, Ser: 5.16 mg/dL — ABNORMAL HIGH (ref 0.44–1.00)
GFR, Estimated: 10 mL/min — ABNORMAL LOW (ref >60)
Glucose, Bld: 267 mg/dL — ABNORMAL HIGH (ref 70–99)
Potassium: 4.7 mmol/L (ref 3.5–5.1)
Sodium: 137 mmol/L (ref 135–145)

## 2021-05-10 LAB — POC OCCULT BLOOD, ED
Fecal Occult Bld: NEGATIVE
Fecal Occult Bld: NEGATIVE

## 2021-05-10 LAB — GLUCOSE, CAPILLARY: Glucose-Capillary: 188 mg/dL — ABNORMAL HIGH (ref 70–99)

## 2021-05-10 MED ORDER — VITAMIN D (ERGOCALCIFEROL) 1.25 MG (50000 UNIT) PO CAPS: 50000.0000 [IU] | ORAL_CAPSULE | ORAL | Status: DC

## 2021-05-10 MED ORDER — ATORVASTATIN CALCIUM 10 MG PO TABS
10.0000 mg | ORAL_TABLET | Freq: Every day | ORAL | Status: DC
  Administered 2021-05-11: 10 mg via ORAL
  Filled 2021-05-10: qty 1

## 2021-05-10 MED ORDER — ONDANSETRON HCL 4 MG/2ML IJ SOLN: 4.0000 mg | Freq: Four times a day (QID) | INTRAMUSCULAR | Status: DC | PRN

## 2021-05-10 MED ORDER — INSULIN GLARGINE 100 UNIT/ML ~~LOC~~ SOLN
25.0000 [IU] | Freq: Every day | SUBCUTANEOUS | Status: DC
  Administered 2021-05-10: 25 [IU] via SUBCUTANEOUS
  Filled 2021-05-10 (×2): qty 0.25

## 2021-05-10 MED ORDER — ONDANSETRON HCL 4 MG PO TABS: 4.0000 mg | ORAL_TABLET | Freq: Four times a day (QID) | ORAL | Status: DC | PRN

## 2021-05-10 MED ORDER — LEVOTHYROXINE SODIUM 50 MCG PO TABS
50.0000 ug | ORAL_TABLET | Freq: Every day | ORAL | Status: DC
  Filled 2021-05-10: qty 1

## 2021-05-10 MED ORDER — ACYCLOVIR 200 MG PO CAPS
200.0000 mg | ORAL_CAPSULE | Freq: Two times a day (BID) | ORAL | Status: DC
  Filled 2021-05-10 (×5): qty 1

## 2021-05-10 MED ORDER — HEPARIN SODIUM (PORCINE) 5000 UNIT/ML IJ SOLN
5000.0000 [IU] | Freq: Three times a day (TID) | INTRAMUSCULAR | Status: DC
  Administered 2021-05-10 – 2021-05-11 (×3): 5000 [IU] via SUBCUTANEOUS
  Filled 2021-05-10 (×3): qty 1

## 2021-05-10 MED ORDER — METOPROLOL SUCCINATE ER 50 MG PO TB24
50.0000 mg | ORAL_TABLET | Freq: Every day | ORAL | Status: DC
  Administered 2021-05-10: 50 mg via ORAL
  Filled 2021-05-10: qty 1

## 2021-05-10 MED ORDER — TORSEMIDE 20 MG PO TABS
150.0000 mg | ORAL_TABLET | Freq: Every day | ORAL | Status: DC
  Administered 2021-05-11: 150 mg via ORAL
  Filled 2021-05-10: qty 8

## 2021-05-10 MED ORDER — SODIUM BICARBONATE 650 MG PO TABS
650.0000 mg | ORAL_TABLET | Freq: Two times a day (BID) | ORAL | Status: DC
  Administered 2021-05-10 – 2021-05-11 (×2): 650 mg via ORAL
  Filled 2021-05-10 (×2): qty 1

## 2021-05-10 MED ORDER — PANTOPRAZOLE SODIUM 40 MG PO TBEC
40.0000 mg | DELAYED_RELEASE_TABLET | Freq: Two times a day (BID) | ORAL | Status: DC
  Administered 2021-05-10 – 2021-05-11 (×2): 40 mg via ORAL
  Filled 2021-05-10 (×2): qty 1

## 2021-05-10 MED ORDER — ACETAMINOPHEN 650 MG RE SUPP: 650.0000 mg | Freq: Four times a day (QID) | RECTAL | Status: DC | PRN

## 2021-05-10 MED ORDER — SEVELAMER CARBONATE 800 MG PO TABS
800.0000 mg | ORAL_TABLET | Freq: Three times a day (TID) | ORAL | Status: DC
  Administered 2021-05-11: 800 mg via ORAL
  Filled 2021-05-10: qty 1

## 2021-05-10 MED ORDER — ACYCLOVIR 200 MG PO CAPS
400.0000 mg | ORAL_CAPSULE | Freq: Once | ORAL | Status: AC
  Administered 2021-05-10: 400 mg via ORAL
  Filled 2021-05-10 (×2): qty 2

## 2021-05-10 MED ORDER — ACETAMINOPHEN 325 MG PO TABS: 650.0000 mg | ORAL_TABLET | Freq: Four times a day (QID) | ORAL | Status: DC | PRN

## 2021-05-10 MED ORDER — HYDRALAZINE HCL 20 MG/ML IJ SOLN: 5.0000 mg | INTRAMUSCULAR | Status: DC | PRN

## 2021-05-10 MED ORDER — ASPIRIN EC 81 MG PO TBEC
81.0000 mg | DELAYED_RELEASE_TABLET | Freq: Every day | ORAL | Status: DC
  Administered 2021-05-11: 81 mg via ORAL
  Filled 2021-05-10: qty 1

## 2021-05-10 MED ORDER — INSULIN GLARGINE 100 UNIT/ML ~~LOC~~ SOLN
50.0000 [IU] | Freq: Every day | SUBCUTANEOUS | Status: DC
  Filled 2021-05-10: qty 0.5

## 2021-05-10 MED ORDER — INSULIN ASPART 100 UNIT/ML IJ SOLN: 0.0000 [IU] | Freq: Three times a day (TID) | INTRAMUSCULAR | Status: DC

## 2021-05-10 NOTE — ED Provider Notes (Signed)
[facility] EMERGENCY DEPARTMENT Provider Note   CSN: [id number] Arrival date & time: 05/10/21  1406     History No chief complaint on file.   Amy Moses is a 50 y.o. female with a history significant for diabetes mellitus, ESRD on dialysis, calciphylaxis with current ongoing ulcerations in her lower extremities undergoing wound management, CHF presenting for evaluation of rectal bleeding.  She had a bleeding polyp on colonoscopy during admission on June 2, underwent hot snare polypectomy by Dr. Abbey Chatters and had no further GI bleeding until yesterday evening.  She reports being constipated and drank a bottle of magnesium citrate after which she had a large bowel movement which was associated with bleeding, in addition to bloody bowel movement this morning.  She denies abdominal pain since she relieved her constipation, cramping, also no nausea or vomiting, no weakness or lightheadedness.  She also has complaints of a tender rash which developed at her left shoulder and has spread to her left upper chest 4 days ago.  She describes blisters which have mostly popped and scabbed, a few lesions have spread to her left upper back as well.  She denies history of shingles, has been exposed to chickenpox as a child.  She has tried OTC creams to the site with no improvement in her symptoms.  She also states she is receiving wound care by home nurse for ulcerations on her bilateral lower legs and medial thighs secondary to calciphylaxis.  She states she has developed an odor from the wound which she suspects is the one on her left lower leg and is concerned about possibility of infection at the site.  Her wound care nurse last stated that she thought the wounds looked good.  Patient denies fevers or chills there have been no increased drainage from these wounds.  The history is provided by the patient.      Past Medical History:  Diagnosis Date   Anemia    Blindness of right eye with low vision in  contralateral eye    s/p victrectomy   Diabetes mellitus, type II ([place])    Dyslipidemia    Glaucoma    Hypertension    Hypothyroidism (acquired)    Kidney disease    Stage 5   Pneumonia     Patient Active Problem List   Diagnosis Date Noted   Acute GI bleeding 04/24/2021   Acute blood loss anemia 04/23/2021   GI bleed 04/22/2021   Fever 03/31/2021   Acute on chronic heart failure with preserved ejection fraction (HFpEF) ([place]) 03/30/2021   Pressure injury of skin 03/30/2021   End stage renal disease ([place]) 11/16/2020   Calciphylaxis 11/06/2020   Non-healing open wound of heel 11/03/2020   Diabetic foot infection ([place]) 11/01/2020   Decubitus ulcer, heel 11/01/2020   Closed nondisplaced fracture of left patella [id number]   Metabolic acidosis [id number]   Acute on chronic renal failure ([place]) 06/10/2020   Symptomatic anemia 06/10/2020   Acute pericardial effusion 06/10/2020   Other acute nonsuppurative otitis media, unspecified ear 11/29/2019   Chronic kidney disease, stage 4 (severe) ([place]) 03/05/2019   Skin ulcer, limited to breakdown of skin ([place]) 01/28/2019   Vitamin D deficiency 01/28/2019   Uncontrolled type 2 diabetes mellitus with chronic kidney disease, with long-term current use of insulin ([place]) 09/21/2015   Hyperlipidemia 09/21/2015   Essential hypertension, benign 09/21/2015   Primary hypothyroidism 09/21/2015   Iris bomb 07/31/2012   Secondary angle-closure glaucoma 07/31/2012    Past Surgical History:  Procedure Laterality Date   ABDOMINAL AORTOGRAM W/LOWER EXTREMITY Bilateral 12/18/2020   Procedure: ABDOMINAL AORTOGRAM W/LOWER EXTREMITY;  Surgeon: Elam Dutch, MD;  Location: [facility];  Service: Cardiovascular;  Laterality: Bilateral;   ANKLE FRACTURE SURGERY     AV FISTULA PLACEMENT Left 08/18/2020   Procedure: LEFT ARM BRACHIOCEPHALIC ARTERIOVENOUS (AV) FISTULA CREATION;  Surgeon: Elam Dutch, MD;  Location: [facility];  Service: Vascular;   Laterality: Left;   BIOPSY  04/24/2021   Procedure: BIOPSY;  Surgeon: Eloise Harman, DO;  [facility];  Service: Endoscopy;;   CESAREAN SECTION     CHOLECYSTECTOMY     COLONOSCOPY  04/24/2021   Procedure: COLONOSCOPY;  Surgeon: Eloise Harman, DO;  [facility];  Service: Endoscopy;;   ESOPHAGOGASTRODUODENOSCOPY (EGD) WITH PROPOFOL N/A 04/24/2021   Procedure: ESOPHAGOGASTRODUODENOSCOPY (EGD) WITH PROPOFOL;  Surgeon: Eloise Harman, DO;  [facility];  Service: Endoscopy;  Laterality: N/A;   EYE SURGERY     Vatrectomy   IR PERC TUN PERIT CATH WO PORT S&I /IMAG  09/15/2020   IR REMOVAL TUN CV CATH W/O FL  02/19/2021   IR US GUIDE VASC ACCESS RIGHT  09/15/2020   POLYPECTOMY  04/24/2021   Procedure: POLYPECTOMY;  Surgeon: Eloise Harman, DO;  [facility];  Service: Endoscopy;;   TOE SURGERY       OB History   No obstetric history on file.     Family History  Problem Relation Age of Onset   Heart disease Mother    Diabetes Mother    Kidney disease Mother    Diabetes Father    Heart disease Father    Diabetes Brother    Colon cancer Neg Hx     Social History   Tobacco Use   Smoking status: Never   Smokeless tobacco: Never  Vaping Use   Vaping Use: Never used  Substance Use Topics   Alcohol use: No   Drug use: No    Home Medications Prior to Admission medications   Medication Sig Start Date End Date Taking? Authorizing Provider  acetaminophen (TYLENOL) 500 MG tablet Take 1,000 mg by mouth every 6 (six) hours as needed for moderate pain.    [provider]  aspirin EC 81 MG tablet Take 1 tablet (81 mg total) by mouth daily with breakfast. 04/25/21 04/25/22  Roxan Hockey, MD  atorvastatin (LIPITOR) 10 MG tablet Take 1 tablet (10 mg total) by mouth daily. 04/25/21   Roxan Hockey, MD  HUMALOG KWIKPEN 100 UNIT/ML KwikPen Inject 5-15 Units into the skin 3 (three) times daily with meals. 03/03/20   [provider]  insulin glargine (LANTUS) 100 UNIT/ML injection Inject 50 Units into the skin at bedtime.    [provider]  levothyroxine (SYNTHROID) 50 MCG tablet Take 1 tablet (50 mcg total) by mouth daily before breakfast. 04/25/21   Roxan Hockey, MD  metoprolol succinate (TOPROL-XL) 50 MG 24 hr tablet Take 1 tablet (50 mg total) by mouth at bedtime. Take with or immediately following a meal. 04/25/21   Emokpae, Courage, MD  pantoprazole (PROTONIX) 40 MG tablet Take 1 tablet (40 mg total) by mouth 2 (two) times daily. 04/25/21 04/25/22  Roxan Hockey, MD  sevelamer carbonate (RENVELA) 800 MG tablet Take 1 tablet (800 mg total) by mouth 3 (three) times daily with meals. 04/25/21   Roxan Hockey, MD  sodium bicarbonate 650 MG tablet Take 1 tablet (650 mg total) by  mouth 2 (two) times daily. 04/25/21   Roxan Hockey, MD  torsemide (DEMADEX) 100 MG tablet Take 150 mg by mouth daily.  05/22/20   [provider]  Vitamin D, Ergocalciferol, (DRISDOL) 1.25 MG (50000 UNIT) CAPS capsule Take 1 capsule by mouth every 14 (fourteen) days. 04/20/21   [provider]    Allergies    Ace inhibitors  Review of Systems   Review of Systems  Constitutional:  Negative for chills and fever.  HENT:  Negative for congestion.   Eyes: Negative.   Respiratory:  Negative for chest tightness and shortness of breath.   Cardiovascular:  Negative for chest pain.  Gastrointestinal:  Positive for rectal pain. Negative for abdominal distention, abdominal pain, nausea and vomiting.  Genitourinary: Negative.   Musculoskeletal:  Negative for arthralgias, joint swelling and neck pain.  Skin:  Positive for rash and wound.  Neurological:  Negative for dizziness, weakness, light-headedness, numbness and headaches.  Psychiatric/Behavioral: Negative.     Physical Exam Updated Vital Signs BP (!) 147/74   Pulse 72   Temp 98.3 F (36.8 C) (Oral)   Resp (!) 23   LMP 08/22/2015 (Approximate)   SpO2 95%    Physical Exam Vitals and nursing note reviewed.  Constitutional:      Appearance: She is well-developed.  HENT:     Head: Normocephalic and atraumatic.  Eyes:     Conjunctiva/sclera: Conjunctivae normal.  Cardiovascular:     Rate and Rhythm: Normal rate and regular rhythm.     Heart sounds: Normal heart sounds.  Pulmonary:     Effort: Pulmonary effort is normal.     Breath sounds: Normal breath sounds. No wheezing.  Abdominal:     General: Bowel sounds are normal.     Palpations: Abdomen is soft.     Tenderness: There is no abdominal tenderness.  Musculoskeletal:        General: Normal range of motion.     Cervical back: Normal range of motion.  Skin:    General: Skin is warm and dry.     Comments: Vesicular rash left shoulder and upper left chest, few lesions left upper back/neck area.   Deep ulcerations bilateral lower lateral calves, right with old appearing central eschar, left with granulation tissue and areas of white adherent exudate around the wound edges, no surrounding erythema.   Neurological:     Mental Status: She is alert.    ED Results / Procedures / Treatments   Labs (all labs ordered are listed, but only abnormal results are displayed) Labs Reviewed  CBC WITH DIFFERENTIAL/PLATELET - Abnormal; Notable for the following components:      Result Value   RBC 3.79 (*)    Hemoglobin 10.1 (*)    HCT 33.3 (*)    RDW 16.2 (*)    All other components within normal limits  BASIC METABOLIC PANEL - Abnormal; Notable for the following components:   Chloride 93 (*)    Glucose, Bld 267 (*)    BUN 40 (*)    Creatinine, Ser 5.16 (*)    GFR, Estimated 10 (*)    Anion gap 18 (*)    All other components within normal limits  POC OCCULT BLOOD, ED    EKG None  Radiology No results found.  Procedures Procedures   Medications Ordered in ED Medications - No data to display  ED Course  I have reviewed the triage vital signs and the nursing notes.  Pertinent  labs & imaging results  that were available during my care of the patient were reviewed by me and considered in my medical decision making (see chart for details).    MDM Rules/Calculators/A&P                          With multiple complaints, the first being return of her GI bleeding.  Her hemoglobin today is 10.1 which is improved from 8.1 on 04/25/2021.  She is strongly Hemoccult positive today.  She has had no bloody stools since arriving here.  Rashes suspicious for probable shingles.  5:42 PM Discussed with Dr. Abbey Chatters who recommends overnight observation and he will consult in am. She is in the window of transient bleeding after large polypectomy, but will need observation and hemoglobin recheck.  Advised clear liquid diet in event her symptoms becomes more brisk, may need to consider rescope if sx worsen.   Pt also seen by Dr. Regenia Skeeter who does not suspect calciphylaxis wounds on legs are infected.   Will start acyclovir per pharmacy consult for suspected shingles rash left shoulder and upper chest.  Pt does need dialysis tomorrow - her dialysis center and nephrologist is in [place].  If pt is stable overnight, may be dc'd in am for outpatient dialysis tomorrow.   Discussed with Dr. Barbaraann Faster who will plan overnight observation admission.     Final Clinical Impression(s) / ED Diagnoses Final diagnoses:  Gastrointestinal hemorrhage, unspecified gastrointestinal hemorrhage type  Herpes zoster without complication  Calciphylaxis    Rx / DC Orders ED Discharge Orders     None        Landis Martins 05/11/21 1329    Sherwood Gambler, MD 05/13/21 [phone]

## 2021-05-10 NOTE — Telephone Encounter (Signed)
[phone]  PLEASE CALL PATIENT, SHE WAS SEEN IN [place] BY DR CARVER AND HAS A FOLLOW UP HERE IN October.  SHE IS HAVING THE SAME ISSUES SHE HAD IN THE HOSPITAL AND NEEDS TO SPEAK TO SOMEONE

## 2021-05-10 NOTE — Telephone Encounter (Signed)
Spoke to pt.  Broke out in a red blistery rash from chest to neck.  Burns and hurts for anything to touch it.  Pt had blood in stool last night.  Black tarry stool.  Had been consipated Friday and Saturday.  Took magnesium citrate Sat for constipation.  Spoke to Dr. Laural Golden at the hospital this morning.  She said he told her it could be hemorrhoids and shingles.  Pt may go to ER in the next hour.  Wants me to route this message to Dr. Abbey Chatters.

## 2021-05-10 NOTE — ED Triage Notes (Signed)
Blistery rash to left shoulder, denies pain, also had an episode of blood in stool

## 2021-05-10 NOTE — Progress Notes (Signed)
Pharmacy Antimicrobial Note  Amy Moses is a 50 y.o. female admitted on 05/10/2021 with  rash c/w shingles .  Pharmacy has been consulted for acyclovir dosing.  Pt on HD TTS.  Plan: Acyclovir '400mg'$  PO x1 then '200mg'$  PO BID.  Height: '5\' 6"'$  (167.6 cm) IBW/kg (Calculated) : 59.3  Temp (24hrs), Avg:98.3 F (36.8 C), Min:98.3 F (36.8 C), Max:98.3 F (36.8 C)  Recent Labs  Lab 05/10/21 1607  WBC 7.3  CREATININE 5.16*    CrCl cannot be calculated (Unknown ideal weight.).    Allergies  Allergen Reactions   Ace Inhibitors Cough       Thank you for allowing pharmacy to be a part of this patient's care.  Wynona Neat, PharmD, BCPS  05/10/2021 9:26 PM

## 2021-05-10 NOTE — ED Notes (Signed)
ED TO INPATIENT HANDOFF REPORT  ED Nurse Name and Phone #:  [phone].  S Name/Age/Gender Amy Moses 50 y.o. female Room/Bed: APA19/APA19  Code Status   Code Status: Full Code  Home/SNF/Other Home Patient oriented to: self, place, time and situation Is this baseline? Yes   Triage Complete: Triage complete  Chief Complaint GI bleed [K92.2]  Triage Note Blistery rash to left shoulder, denies pain, also had an episode of blood in stool     Allergies Allergies  Allergen Reactions  . Ace Inhibitors Cough    Level of Care/Admitting Diagnosis ED Disposition    ED Disposition  Admit   Condition  --   Comment  Hospital Area: [name] [id number]  Level of Care: Med-Surg [16]  Covid Evaluation: Asymptomatic Screening Protocol (No Symptoms)  Diagnosis: GI bleed [id number]  Admitting Physician: Debria Garret  Attending Physician: Marily Memos, DAVID J [id number]         B Medical/Surgery History Past Medical History:  Diagnosis Date  . Anemia   . 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 Diabetes mellitus, type II ([place])   . Dyslipidemia   . Glaucoma   . Hypertension   . Hypothyroidism (acquired)   . Kidney disease    Stage 5  . Pneumonia    Past Surgical History:  Procedure Laterality Date  . ABDOMINAL AORTOGRAM W/LOWER EXTREMITY Bilateral 12/18/2020   Procedure: ABDOMINAL AORTOGRAM W/LOWER EXTREMITY;  Surgeon: Elam Dutch, MD;  Location: [place] [facility];  Service: Cardiovascular;  Laterality: Bilateral;  . ANKLE FRACTURE SURGERY    . AV FISTULA PLACEMENT Left 08/18/2020   Procedure: LEFT ARM BRACHIOCEPHALIC ARTERIOVENOUS (AV) FISTULA CREATION;  Surgeon: Elam Dutch, MD;  Location: [facility];  Service: Vascular;  Laterality: Left;  . BIOPSY  04/24/2021   Procedure: BIOPSY;  Surgeon: Eloise Harman, DO;  [facility];  Service: Endoscopy;;  . CESAREAN SECTION    . CHOLECYSTECTOMY     . COLONOSCOPY  04/24/2021   Procedure: COLONOSCOPY;  Surgeon: Eloise Harman, DO;  [facility];  Service: Endoscopy;;  . ESOPHAGOGASTRODUODENOSCOPY (EGD) WITH PROPOFOL N/A 04/24/2021   Procedure: ESOPHAGOGASTRODUODENOSCOPY (EGD) WITH PROPOFOL;  Surgeon: Eloise Harman, DO;  [facility];  Service: Endoscopy;  Laterality: N/A;  . EYE SURGERY     Vatrectomy  . IR PERC TUN PERIT CATH WO PORT S&I Dartha Lodge  09/15/2020  . IR REMOVAL TUN CV CATH W/O FL  02/19/2021  . IR US GUIDE VASC ACCESS RIGHT  09/15/2020  . POLYPECTOMY  04/24/2021   Procedure: POLYPECTOMY;  Surgeon: Eloise Harman, DO;  [facility];  Service: Endoscopy;;  . TOE SURGERY       A IV Location/Drains/Wounds Patient Lines/Drains/Airways Status    Active Line/Drains/Airways    Name Placement date Placement time Site Days   Peripheral IV 04/22/21 20 G Right Forearm 04/22/21  2232  Forearm  18   Peripheral IV 05/10/21 22 G Right Hand 05/10/21  1929  Hand  less than 1   Fistula / Graft Left Other (Comment) Arteriovenous fistula 08/18/20  1240  Other (Comment)  265   Fistula / Graft Left Upper arm Arteriovenous fistula --  --  Upper arm  --   Incision (Closed) 08/18/20 Arm Left 08/18/20  0915  -- 265   Pressure Injury 03/30/21 Heel Right Stage 2 -  Partial thickness loss of dermis  presenting as a shallow open injury with a red, pink wound bed without slough. 03/30/21  1617  -- 41   Pressure Injury 03/30/21 Buttocks Right Stage 2 -  Partial thickness loss of dermis presenting as a shallow open injury with a red, pink wound bed without slough. 03/30/21  1618  -- 41   Pressure Injury 03/30/21 Buttocks Left Stage 2 -  Partial thickness loss of dermis presenting as a shallow open injury with a red, pink wound bed without slough. 03/30/21  1619  -- 41   Wound / Incision (Open or Dehisced) 03/30/21 Leg Left;Posterior calciphylaxis 03/30/21  1620  Leg  41          Intake/Output Last 24 hours No intake  or output data in the 24 hours ending 05/10/21 2148  Labs/Imaging Results for orders placed or performed during the hospital encounter of 05/10/21 (from the past 48 hour(s))  CBC with Differential/Platelet     Status: Abnormal   Collection Time: 05/10/21  4:07 PM  Result Value Ref Range   WBC 7.3 4.0 - 10.5 K/uL   RBC 3.79 (L) 3.87 - 5.11 MIL/uL   Hemoglobin 10.1 (L) 12.0 - 15.0 g/dL   HCT 33.3 (L) 36.0 - 46.0 %   MCV 87.9 80.0 - 100.0 fL   MCH 26.6 26.0 - 34.0 pg   MCHC 30.3 30.0 - 36.0 g/dL   RDW 16.2 (H) 11.5 - 15.5 %   Platelets 318 150 - 400 K/uL   nRBC 0.0 0.0 - 0.2 %   Neutrophils Relative % 71 %   Neutro Abs 5.2 1.7 - 7.7 K/uL   Lymphocytes Relative 16 %   Lymphs Abs 1.2 0.7 - 4.0 K/uL   Monocytes Relative 10 %   Monocytes Absolute 0.7 0.1 - 1.0 K/uL   Eosinophils Relative 2 %   Eosinophils Absolute 0.2 0.0 - 0.5 K/uL   Basophils Relative 1 %   Basophils Absolute 0.1 0.0 - 0.1 K/uL   Immature Granulocytes 0 %   Abs Immature Granulocytes 0.02 0.00 - 0.07 K/uL    Comment: Performed at [facility], [address] [place], [place] [id number]  Basic metabolic panel     Status: Abnormal   Collection Time: 05/10/21  4:07 PM  Result Value Ref Range   Sodium 137 135 - 145 mmol/L   Potassium 4.7 3.5 - 5.1 mmol/L   Chloride 93 (L) 98 - 111 mmol/L   CO2 26 22 - 32 mmol/L   Glucose, Bld 267 (H) 70 - 99 mg/dL    Comment: Glucose reference range applies only to samples taken after fasting for at least 8 hours.   BUN 40 (H) 6 - 20 mg/dL   Creatinine, Ser 5.16 (H) 0.44 - 1.00 mg/dL   Calcium 9.6 8.9 - 10.3 mg/dL   GFR, Estimated 10 (L) >60 mL/min    Comment: (NOTE) Calculated using the CKD-EPI Creatinine Equation (2021)    Anion gap 18 (H) 5 - 15    Comment: Performed at [facility], [address] [place], [place] [address]  POC occult blood, ED     Status: None   Collection Time: 05/10/21  6:59 PM  Result Value Ref Range   Fecal Occult Bld NEGATIVE NEGATIVE  POC occult  blood, ED     Status: None   Collection Time: 05/10/21  7:18 PM  Result Value Ref Range   Fecal Occult Bld NEGATIVE NEGATIVE  Resp Panel by RT-PCR (Flu A&B, Covid) Nasopharyngeal Swab  Status: None   Collection Time: 05/10/21  7:49 PM   Specimen: Nasopharyngeal Swab; Nasopharyngeal(NP) swabs in vial transport medium  Result Value Ref Range   SARS Coronavirus 2 by RT PCR NEGATIVE NEGATIVE    Comment: (NOTE) SARS-CoV-2 target nucleic acids are NOT DETECTED.  The SARS-CoV-2 RNA is generally detectable in upper respiratory specimens during the acute phase of infection. The lowest concentration of SARS-CoV-2 viral copies this assay can detect is 138 copies/mL. A negative result does not preclude SARS-Cov-2 infection and should not be used as the sole basis for treatment or other patient management decisions. A negative result may occur with  improper specimen collection/handling, submission of specimen other than nasopharyngeal swab, presence of viral mutation(s) within the areas targeted by this assay, and inadequate number of viral copies(<138 copies/mL). A negative result must be combined with clinical observations, patient history, and epidemiological information. The expected result is Negative.  Fact Sheet for Patients:  EntrepreneurPulse.com.au  Fact Sheet for Healthcare Providers:  IncredibleEmployment.be  This test is no t yet approved or cleared by the [place] FDA and  has been authorized for detection and/or diagnosis of SARS-CoV-2 by FDA under an Emergency Use Authorization (EUA). This EUA will remain  in effect (meaning this test can be used) for the duration of the COVID-19 declaration under Section 564(b)(1) of the Act, 21 U.S.C.section 360bbb-3(b)(1), unless the authorization is terminated  or revoked sooner.       Influenza A by PCR NEGATIVE NEGATIVE   Influenza B by PCR NEGATIVE NEGATIVE    Comment: (NOTE) The Xpert  Xpress SARS-CoV-2/FLU/RSV plus assay is intended as an aid in the diagnosis of influenza from Nasopharyngeal swab specimens and should not be used as a sole basis for treatment. Nasal washings and aspirates are unacceptable for Xpert Xpress SARS-CoV-2/FLU/RSV testing.  Fact Sheet for Patients: EntrepreneurPulse.com.au  Fact Sheet for Healthcare Providers: IncredibleEmployment.be  This test is not yet approved or cleared by the [place] FDA and has been authorized for detection and/or diagnosis of SARS-CoV-2 by FDA under an Emergency Use Authorization (EUA). This EUA will remain in effect (meaning this test can be used) for the duration of the COVID-19 declaration under Section 564(b)(1) of the Act, 21 U.S.C. section 360bbb-3(b)(1), unless the authorization is terminated or revoked.  Performed at [facility], [address] [place], [place] [address]    No results found.  Pending Labs Unresulted Labs (From admission, onward)    Start     Ordered   05/11/21 0500  Magnesium  Tomorrow morning,   R        05/10/21 2109   05/11/21 0500  Phosphorus  Tomorrow morning,   R        05/10/21 2109   05/11/21 0500  CBC  Tomorrow morning,   R        05/10/21 2109   05/11/21 0500  Comprehensive metabolic panel  Tomorrow morning,   R        05/10/21 2109   05/10/21 2111  Hemoglobin and hematocrit, blood  Now then every 4 hours,   R (with STAT occurrences)      05/10/21 2110          Vitals/Pain Today's Vitals   05/10/21 1830 05/10/21 1900 05/10/21 2100 05/10/21 2108  BP: 135/71 (!) 142/71    Pulse: 71 72    Resp: (!) 23     Temp:      TempSrc:      SpO2: 93% 98%  97%  Height:   '5\' 6"'$  (1.676 m)   PainSc:        Isolation Precautions Airborne and Contact precautions  Medications Medications  aspirin EC tablet 81 mg (has no administration in time range)  atorvastatin (LIPITOR) tablet 10 mg (has no administration in time range)   levothyroxine (SYNTHROID) tablet 50 mcg (has no administration in time range)  metoprolol succinate (TOPROL-XL) 24 hr tablet 50 mg (has no administration in time range)  pantoprazole (PROTONIX) EC tablet 40 mg (has no administration in time range)  sevelamer carbonate (RENVELA) tablet 800 mg (has no administration in time range)  torsemide (DEMADEX) tablet 150 mg (has no administration in time range)  sodium bicarbonate tablet 650 mg (has no administration in time range)  Vitamin D (Ergocalciferol) (DRISDOL) capsule 50,000 Units (has no administration in time range)  heparin injection 5,000 Units (has no administration in time range)  acetaminophen (TYLENOL) tablet 650 mg (has no administration in time range)    Or  acetaminophen (TYLENOL) suppository 650 mg (has no administration in time range)  ondansetron (ZOFRAN) tablet 4 mg (has no administration in time range)    Or  ondansetron (ZOFRAN) injection 4 mg (has no administration in time range)  hydrALAZINE (APRESOLINE) injection 5 mg (has no administration in time range)  insulin aspart (novoLOG) injection 0-9 Units (has no administration in time range)  insulin glargine (LANTUS) injection 25 Units (has no administration in time range)  acyclovir (ZOVIRAX) 200 MG capsule 400 mg (has no administration in time range)  acyclovir (ZOVIRAX) 200 MG capsule 200 mg (has no administration in time range)    Mobility manual wheelchair Low fall risk   Focused Assessments    R Recommendations: See Admitting Provider Note  Report given to:   Additional Notes:

## 2021-05-10 NOTE — H&P (Signed)
History and Physical    Amy Moses [id number] DOB: May 15, 1971 DOA: 05/10/2021  PCP: Leeanne Rio, MD Patient coming from: home  Chief Complaint: GI bleed  HPI: Amy Moses is a 50 y.o. female with medical history significant of Anemia, Blind R eye, DM, HLD, HTN, hypothyroidism, ESRD on dialysis. Dialysis T/TH/Sat. Pt presenting after 2 bloody BMs. Pt discharged from the hospital on 6/5 after 3 day admission for GI bleed and underwent colo w/ polypectomy which was thought to be the caus eof the bleeding. Constipation since that time. Pt reports using laxitive the evenign prior to admission to help with BMa nd passed a bloody firm stool. In the AM on the day of admission pt passed another BM w/ less blood. No Abd pain, loose stools, CP, palpitations, CP. No other complaints.   Pt also complaining of L chest wall rash. Started 4 days ago. Skin is hypersensitive. Chikenpox as a child.    ED Course: Objective findings below. NOted to have a shingles rash and started on Acyclovir. GI consulted for GI bleed.   Review of Systems: As per HPI otherwise all other systems reviewed and are negative   Past Medical History:  Diagnosis Date   Anemia    Blindness of right eye with low vision in contralateral eye    s/p victrectomy   Diabetes mellitus, type II ([place])    Dyslipidemia    Glaucoma    Hypertension    Hypothyroidism (acquired)    Kidney disease    Stage 5   Pneumonia     Past Surgical History:  Procedure Laterality Date   ABDOMINAL AORTOGRAM W/LOWER EXTREMITY Bilateral 12/18/2020   Procedure: ABDOMINAL AORTOGRAM W/LOWER EXTREMITY;  Surgeon: Elam Dutch, MD;  Location: [place] [facility];  Service: Cardiovascular;  Laterality: Bilateral;   ANKLE FRACTURE SURGERY     AV FISTULA PLACEMENT Left 08/18/2020   Procedure: LEFT ARM BRACHIOCEPHALIC ARTERIOVENOUS (AV) FISTULA CREATION;  Surgeon: Elam Dutch, MD;  Location: [place];  Service: Vascular;  Laterality:  Left;   BIOPSY  04/24/2021   Procedure: BIOPSY;  Surgeon: Eloise Harman, DO;  [facility];  Service: Endoscopy;;   CESAREAN SECTION     CHOLECYSTECTOMY     COLONOSCOPY  04/24/2021   Procedure: COLONOSCOPY;  Surgeon: Eloise Harman, DO;  [facility];  Service: Endoscopy;;   ESOPHAGOGASTRODUODENOSCOPY (EGD) WITH PROPOFOL N/A 04/24/2021   Procedure: ESOPHAGOGASTRODUODENOSCOPY (EGD) WITH PROPOFOL;  Surgeon: Eloise Harman, DO;  [facility];  Service: Endoscopy;  Laterality: N/A;   EYE SURGERY     Vatrectomy   IR PERC TUN PERIT CATH WO PORT S&I /IMAG  09/15/2020   IR REMOVAL TUN CV CATH W/O FL  02/19/2021   IR US GUIDE VASC ACCESS RIGHT  09/15/2020   POLYPECTOMY  04/24/2021   Procedure: POLYPECTOMY;  Surgeon: Eloise Harman, DO;  [facility];  Service: Endoscopy;;   TOE SURGERY      Social History   Socioeconomic History   Marital status: Single    Spouse name: Not on file   Number of children: Not on file   Years of education: Not on file   Highest education level: Not on file  Occupational History   Not on file  Tobacco Use   Smoking status: Never   Smokeless tobacco: Never  Vaping Use   Vaping Use: Never used  Substance and Sexual Activity   Alcohol use: No   Drug  use: No   Sexual activity: Yes    Birth control/protection: Condom  Other Topics Concern   Not on file  Social History Narrative   Not on file   Social Determinants of Health   Financial Resource Strain: Not on file  Food Insecurity: Not on file  Transportation Needs: Not on file  Physical Activity: Not on file  Stress: Not on file  Social Connections: Not on file  Intimate Partner Violence: Not on file    Allergies  Allergen Reactions   Ace Inhibitors Cough    Family History  Problem Relation Age of Onset   Heart disease Mother    Diabetes Mother    Kidney disease Mother    Diabetes Father    Heart disease Father    Diabetes Brother     Colon cancer Neg Hx       Prior to Admission medications   Medication Sig Start Date End Date Taking? Authorizing Provider  acetaminophen (TYLENOL) 500 MG tablet Take 1,000 mg by mouth every 6 (six) hours as needed for moderate pain.    [provider]  aspirin EC 81 MG tablet Take 1 tablet (81 mg total) by mouth daily with breakfast. 04/25/21 04/25/22  Roxan Hockey, MD  atorvastatin (LIPITOR) 10 MG tablet Take 1 tablet (10 mg total) by mouth daily. 04/25/21   Roxan Hockey, MD  HUMALOG KWIKPEN 100 UNIT/ML KwikPen Inject 5-15 Units into the skin 3 (three) times daily with meals. 03/03/20   [provider]  insulin glargine (LANTUS) 100 UNIT/ML injection Inject 50 Units into the skin at bedtime.    [provider]  levothyroxine (SYNTHROID) 50 MCG tablet Take 1 tablet (50 mcg total) by mouth daily before breakfast. 04/25/21   Roxan Hockey, MD  metoprolol succinate (TOPROL-XL) 50 MG 24 hr tablet Take 1 tablet (50 mg total) by mouth at bedtime. Take with or immediately following a meal. 04/25/21   Emokpae, Courage, MD  pantoprazole (PROTONIX) 40 MG tablet Take 1 tablet (40 mg total) by mouth 2 (two) times daily. 04/25/21 04/25/22  Roxan Hockey, MD  sevelamer carbonate (RENVELA) 800 MG tablet Take 1 tablet (800 mg total) by mouth 3 (three) times daily with meals. 04/25/21   Roxan Hockey, MD  sodium bicarbonate 650 MG tablet Take 1 tablet (650 mg total) by mouth 2 (two) times daily. 04/25/21   Roxan Hockey, MD  torsemide (DEMADEX) 100 MG tablet Take 150 mg by mouth daily.  05/22/20   [provider]  Vitamin D, Ergocalciferol, (DRISDOL) 1.25 MG (50000 UNIT) CAPS capsule Take 1 capsule by mouth every 14 (fourteen) days. 04/20/21   [provider]    Physical Exam: Vitals:   05/10/21 1730 05/10/21 1800 05/10/21 1830 05/10/21 1900  BP: 140/72 (!) 144/74 135/71 (!) 142/71  Pulse: 72 72 71 72  Resp: (!) 23 15 (!) 23   Temp:      TempSrc:      SpO2: 97%  97% 93% 98%     General:  Appears calm and comfortable Eyes: R eye blind. L eye EOMI.  ENT:  grossly normal hearing, lips & tongue, mmm Neck:  no LAD, masses or thyromegaly Cardiovascular:  RRR, No LE edema.  Respiratory:  Normal respiratory effort. Abdomen:  soft, ntnd, NABS Skin: L chest wall rash w/ open lesions. Intact.  Musculoskeletal:  grossly normal tone BUE/BLE, good ROM, no bony abnormality Psychiatric:  grossly normal mood and affect, speech fluent and appropriate, AOx3 Neurologic:  CN 2-12  grossly intact, moves all extremities in coordinated fashion, sensation intact  Labs on Admission: I have personally reviewed following labs and imaging studies  CBC: Recent Labs  Lab 05/10/21 1607  WBC 7.3  NEUTROABS 5.2  HGB 10.1*  HCT 33.3*  MCV 87.9  PLT 0000000   Basic Metabolic Panel: Recent Labs  Lab 05/10/21 1607  NA 137  K 4.7  CL 93*  CO2 26  GLUCOSE 267*  BUN 40*  CREATININE 5.16*  CALCIUM 9.6   GFR: CrCl cannot be calculated (Unknown ideal weight.). Liver Function Tests: No results for input(s): AST, ALT, ALKPHOS, BILITOT, PROT, ALBUMIN in the last 168 hours. No results for input(s): LIPASE, AMYLASE in the last 168 hours. No results for input(s): AMMONIA in the last 168 hours. Coagulation Profile: No results for input(s): INR, PROTIME in the last 168 hours. Cardiac Enzymes: No results for input(s): CKTOTAL, CKMB, CKMBINDEX, TROPONINI in the last 168 hours. BNP (last 3 results) No results for input(s): PROBNP in the last 8760 hours. HbA1C: No results for input(s): HGBA1C in the last 72 hours. CBG: No results for input(s): GLUCAP in the last 168 hours. Lipid Profile: No results for input(s): CHOL, HDL, LDLCALC, TRIG, CHOLHDL, LDLDIRECT in the last 72 hours. Thyroid Function Tests: No results for input(s): TSH, T4TOTAL, FREET4, T3FREE, THYROIDAB in the last 72 hours. Anemia Panel: No results for input(s): VITAMINB12, FOLATE, FERRITIN, TIBC, IRON,  RETICCTPCT in the last 72 hours. Urine analysis: No results found for: COLORURINE, APPEARANCEUR, LABSPEC, PHURINE, GLUCOSEU, HGBUR, BILIRUBINUR, KETONESUR, PROTEINUR, UROBILINOGEN, NITRITE, LEUKOCYTESUR  Creatinine Clearance: CrCl cannot be calculated (Unknown ideal weight.).  Sepsis Labs: '@LABRCNTIP'$ (procalcitonin:4,lacticidven:4) ) Recent Results (from the past 240 hour(s))  Resp Panel by RT-PCR (Flu A&B, Covid) Nasopharyngeal Swab     Status: None   Collection Time: 05/10/21  7:49 PM   Specimen: Nasopharyngeal Swab; Nasopharyngeal(NP) swabs in vial transport medium  Result Value Ref Range Status   SARS Coronavirus 2 by RT PCR NEGATIVE NEGATIVE Final    Comment: (NOTE) SARS-CoV-2 target nucleic acids are NOT DETECTED.  The SARS-CoV-2 RNA is generally detectable in upper respiratory specimens during the acute phase of infection. The lowest concentration of SARS-CoV-2 viral copies this assay can detect is 138 copies/mL. A negative result does not preclude SARS-Cov-2 infection and should not be used as the sole basis for treatment or other patient management decisions. A negative result may occur with  improper specimen collection/handling, submission of specimen other than nasopharyngeal swab, presence of viral mutation(s) within the areas targeted by this assay, and inadequate number of viral copies(<138 copies/mL). A negative result must be combined with clinical observations, patient history, and epidemiological information. The expected result is Negative.  Fact Sheet for Patients:  EntrepreneurPulse.com.au  Fact Sheet for Healthcare Providers:  IncredibleEmployment.be  This test is no t yet approved or cleared by the [place] FDA and  has been authorized for detection and/or diagnosis of SARS-CoV-2 by FDA under an Emergency Use Authorization (EUA). This EUA will remain  in effect (meaning this test can be used) for the duration of  the COVID-19 declaration under Section 564(b)(1) of the Act, 21 U.S.C.section 360bbb-3(b)(1), unless the authorization is terminated  or revoked sooner.       Influenza A by PCR NEGATIVE NEGATIVE Final   Influenza B by PCR NEGATIVE NEGATIVE Final    Comment: (NOTE) The Xpert Xpress SARS-CoV-2/FLU/RSV plus assay is intended as an aid in the diagnosis of influenza from Nasopharyngeal swab specimens and should not  be used as a sole basis for treatment. Nasal washings and aspirates are unacceptable for Xpert Xpress SARS-CoV-2/FLU/RSV testing.  Fact Sheet for Patients: EntrepreneurPulse.com.au  Fact Sheet for Healthcare Providers: IncredibleEmployment.be  This test is not yet approved or cleared by the [place] FDA and has been authorized for detection and/or diagnosis of SARS-CoV-2 by FDA under an Emergency Use Authorization (EUA). This EUA will remain in effect (meaning this test can be used) for the duration of the COVID-19 declaration under Section 564(b)(1) of the Act, 21 U.S.C. section 360bbb-3(b)(1), unless the authorization is terminated or revoked.  Performed at [facility], [address] [place], [place] [address]      Radiological Exams on Admission: No results found.    Assessment/Plan Active Problems:   Uncontrolled type 2 diabetes mellitus with chronic kidney disease, with long-term current use of insulin (HCC)   Hyperlipidemia   Essential hypertension, benign   End stage renal disease (HCC)   Calciphylaxis   GI bleed   Blindness   Shingles   GI bleed: unwhelming considering recent admission when pt was hemodynamically unstable. Last BM >12 hrs ago and no sx. Suspect exacerbation of healing colon from firm impacted stool passed w/ laxative.  - CBC Q4 - CLD and NPO after midnight in case pt needs colo - GI consulted by EDP and appreciate their insight.   Calciphylaxis: Chronic - wound care.   Shingles: -  continue Acyclovir  ESRD: Dyalisis T/TH/Sa. Hemodynamically stable.  - Call renal in am for dialysis.  - continue torsemide.   DM:  - SSI - Continue Lantus at half home dose - A1c  HTN: - continue Metop - Hydralazine PRN  HLD: - continue statin  Hypothyroid - continue synthroid     DVT prophylaxis: Hep  Code Status: full  Family Communication: none  Disposition Plan: pending stabilization of Hgb after GI bleed  Consults called: GI  Admission status: obs    Waldemar Dickens MD [facility]  If 7PM-7AM, please contact night-coverage [email or web address] Password [facility]  05/10/2021, 9:01 PM

## 2021-05-11 DIAGNOSIS — E1122 Type 2 diabetes mellitus with diabetic chronic kidney disease: Secondary | ICD-10-CM

## 2021-05-11 DIAGNOSIS — I10 Essential (primary) hypertension: Secondary | ICD-10-CM | POA: Diagnosis not present

## 2021-05-11 DIAGNOSIS — Z794 Long term (current) use of insulin: Secondary | ICD-10-CM

## 2021-05-11 DIAGNOSIS — E1165 Type 2 diabetes mellitus with hyperglycemia: Secondary | ICD-10-CM

## 2021-05-11 DIAGNOSIS — K922 Gastrointestinal hemorrhage, unspecified: Secondary | ICD-10-CM | POA: Diagnosis not present

## 2021-05-11 DIAGNOSIS — N186 End stage renal disease: Secondary | ICD-10-CM | POA: Diagnosis not present

## 2021-05-11 LAB — CBC
HCT: 29 % — ABNORMAL LOW (ref 36.0–46.0)
Hemoglobin: 8.7 g/dL — ABNORMAL LOW (ref 12.0–15.0)
MCH: 26.2 pg (ref 26.0–34.0)
MCHC: 30 g/dL (ref 30.0–36.0)
MCV: 87.3 fL (ref 80.0–100.0)
Platelets: 296 10*3/uL (ref 150–400)
RBC: 3.32 MIL/uL — ABNORMAL LOW (ref 3.87–5.11)
RDW: 16.3 % — ABNORMAL HIGH (ref 11.5–15.5)
WBC: 8.9 10*3/uL (ref 4.0–10.5)
nRBC: 0 % (ref 0.0–0.2)

## 2021-05-11 LAB — COMPREHENSIVE METABOLIC PANEL
ALT: 8 U/L (ref 0–44)
AST: 8 U/L — ABNORMAL LOW (ref 15–41)
Albumin: 2.6 g/dL — ABNORMAL LOW (ref 3.5–5.0)
Alkaline Phosphatase: 111 U/L (ref 38–126)
Anion gap: 15 (ref 5–15)
BUN: 45 mg/dL — ABNORMAL HIGH (ref 6–20)
CO2: 25 mmol/L (ref 22–32)
Calcium: 9.3 mg/dL (ref 8.9–10.3)
Chloride: 97 mmol/L — ABNORMAL LOW (ref 98–111)
Creatinine, Ser: 5.37 mg/dL — ABNORMAL HIGH (ref 0.44–1.00)
GFR, Estimated: 9 mL/min — ABNORMAL LOW (ref >60)
Glucose, Bld: 191 mg/dL — ABNORMAL HIGH (ref 70–99)
Potassium: 4.3 mmol/L (ref 3.5–5.1)
Sodium: 137 mmol/L (ref 135–145)
Total Bilirubin: 0.7 mg/dL (ref 0.3–1.2)
Total Protein: 5.6 g/dL — ABNORMAL LOW (ref 6.5–8.1)

## 2021-05-11 LAB — HEMOGLOBIN AND HEMATOCRIT, BLOOD
HCT: 30.2 % — ABNORMAL LOW (ref 36.0–46.0)
HCT: 30.8 % — ABNORMAL LOW (ref 36.0–46.0)
HCT: 31.5 % — ABNORMAL LOW (ref 36.0–46.0)
Hemoglobin: 9 g/dL — ABNORMAL LOW (ref 12.0–15.0)
Hemoglobin: 9.2 g/dL — ABNORMAL LOW (ref 12.0–15.0)
Hemoglobin: 9.5 g/dL — ABNORMAL LOW (ref 12.0–15.0)

## 2021-05-11 LAB — GLUCOSE, CAPILLARY
Glucose-Capillary: 133 mg/dL — ABNORMAL HIGH (ref 70–99)
Glucose-Capillary: 76 mg/dL (ref 70–99)
Glucose-Capillary: 108 mg/dL — ABNORMAL HIGH (ref 70–99)

## 2021-05-11 LAB — PHOSPHORUS: Phosphorus: 5.6 mg/dL — ABNORMAL HIGH (ref 2.5–4.6)

## 2021-05-11 LAB — MAGNESIUM: Magnesium: 2.8 mg/dL — ABNORMAL HIGH (ref 1.7–2.4)

## 2021-05-11 MED ORDER — VALACYCLOVIR HCL 500 MG PO TABS: 500.0000 mg | ORAL_TABLET | ORAL | Status: DC

## 2021-05-11 MED ORDER — POLYETHYLENE GLYCOL 3350 17 G PO PACK: 17.0000 g | PACK | Freq: Every day | ORAL | 1 refills | Status: DC | PRN

## 2021-05-11 MED ORDER — HYDROCORTISONE (PERIANAL) 2.5 % EX CREA: 1.0000 "application " | TOPICAL_CREAM | Freq: Two times a day (BID) | CUTANEOUS | 0 refills | Status: AC

## 2021-05-11 MED ORDER — VALACYCLOVIR HCL 500 MG PO TABS
500.0000 mg | ORAL_TABLET | Freq: Once | ORAL | Status: AC
  Administered 2021-05-11: 500 mg via ORAL
  Filled 2021-05-11: qty 1

## 2021-05-11 MED ORDER — VALACYCLOVIR HCL 500 MG PO TABS: 500.0000 mg | ORAL_TABLET | ORAL | 0 refills | Status: AC

## 2021-05-11 MED ORDER — SODIUM THIOSULFATE 250 MG/ML IV SOLN
25.0000 g | Freq: Once | INTRAVENOUS | Status: DC
  Filled 2021-05-11: qty 100

## 2021-05-11 MED ORDER — CHLORHEXIDINE GLUCONATE CLOTH 2 % EX PADS
6.0000 | MEDICATED_PAD | Freq: Every day | CUTANEOUS | Status: DC
  Administered 2021-05-11: 6 via TOPICAL

## 2021-05-11 NOTE — Clinical Social Work Note (Signed)
Spoke with patient's daughter, Hazel Sams, regarding HD transportation for 6/22 at 2p.m. Patient's daughter is agreeable to transport patient to HD tomorrow at 2 p.m. Melinda at [facility] advised that patient will attend tomorrow's 2p.m. appointment.   Camya Haydon, Clydene Pugh, LCSW

## 2021-05-11 NOTE — Progress Notes (Signed)
Was called to see this patient -  TTS at [facility]-  recent GIB with invasive work up and intervention.  Presented with BRBPR after procedure and intervention and also left chest shingles.  Felt to be stable for discharge today per Dr. Dyann Kief.  I spoke with patients home HD unit in [place].  They could run her at noon today if she can get there ( she says there is no way)  or they could run her at 2 PM tomorrow ( she is not sure)  she just wants to be run here but I told her she is fourth on the list in terms of need so she may not get run until midnight.  She is going to continue to think about it   Louis Meckel

## 2021-05-11 NOTE — Progress Notes (Signed)
50 year old female who was recently discharged from hospital on 6/5 after 3 day admission for GI bleed. She underwent colonoscopy with 25 mm polyp in the transverse colon with attached clot and stigmata of recent bleed s/p polypectomy with hot snare, internal hemorrhoids, perianal skin tags.  Colonic polyp was suspected to be source of GI bleed. Pathology with prolapse type polyp. Also had EGD during that admission with gastritis and duodenitis biopsied (stomach biopsy with chronic inflammation/erosion, negative for H. pylori, duodenal biopsy with peptic duodenitis). She presented to the emergency room yesterday due to reports of passing a bloody firm stool in the setting of significant constipation.  Hemoglobin 10.1 on admission, up drom 8.1 at hospital discharge on 6/5. Denies abdominal pain.  Dr. Abbey Chatters spoke with the ED provider and recommended overnight observation.  Stated patient was in the window of transient bleed after large polypectomy.  If bleeding becomes more brisk, would need to consider rescoping.   Spoke with Dr. Dyann Kief this morning who states GI does not need to see patient today as she is likely getting discharged.  She is clinically doing well with no further overt bleeding.  Hemoglobin this admission 10.1>>9.0>>8.7>>9.2. Decline likely secondary to equilibration.  She remains hemodynamically stable.  Suspect she may have intermittent bleeding from polypectomy site versus hemorrhoidal bleed in the setting of constipation.  Plan/recommendations:  GI will hold off on official consult for now. Would be glad to see patient if further overt bleeding/decline in hemoglobin. Please re-consult GI if needed.  She should monitor for rectal bleeding outpatient and return to the ED if this occurs.  Recommend MiraLAX 17g daily to help with constipation.  Avoid stimulant laxative.  Anusol rectal cream BID x7-10 days. Avoid NSAIDs.   Aliene Altes, PA-C [facility]  Gastroenterology' 05/11/2021

## 2021-05-11 NOTE — Progress Notes (Signed)
Pt had large, loose, dark brown stool followed by moderate amount of bright red bleeding. MD made aware.

## 2021-05-11 NOTE — Discharge Summary (Signed)
Physician Discharge Summary  Amy Moses [id number] DOB: [birth year] DOA: 05/10/2021  PCP: Leeanne Rio, MD  Admit date: 05/10/2021 Discharge date: 05/11/2021  Time spent: 35 minutes  Recommendations for Outpatient Follow-up:  Repeat CBC to follow hemoglobin trend Repeat basic metabolic panel to follow electrolytes renal function Continue to follow CBG/A1c with further adjustment to hypoglycemic regimen as needed. Reassess patient left chest skin lesions to assure resolution of shingles process.    Discharge Diagnoses:  Active Problems:   Uncontrolled type 2 diabetes mellitus with chronic kidney disease, with long-term current use of insulin (HCC)   Hyperlipidemia   Essential hypertension, benign   End stage renal disease (HCC)   Calciphylaxis   GI bleed   Blindness   Shingles   Discharge Condition: Stable and improved.  Discharged home with instructions to follow-up with PCP and nephrology as an outpatient.  CODE STATUS: Full code.  Diet recommendation: Modified carbohydrates and renal diet.  Filed Weights   05/10/21 2239  Weight: 80.4 kg    History of present illness:  As per H&P written by Dr. Marily Memos on 05/10/2021 Amy Moses is a 50 y.o. female with medical history significant of Anemia, Blind R eye, DM, HLD, HTN, hypothyroidism, ESRD on dialysis. Dialysis T/TH/Sat. Pt presenting after 2 bloody BMs. Pt discharged from the hospital on 6/5 after 3 day admission for GI bleed and underwent colo w/ polypectomy which was thought to be the caus eof the bleeding. Constipation since that time. Pt reports using laxitive the evenign prior to admission to help with BMa nd passed a bloody firm stool. In the AM on the day of admission pt passed another BM w/ less blood. No Abd pain, loose stools, CP, palpitations, CP. No other complaints.   Pt also complaining of L chest wall rash. Started 4 days ago. Skin is hypersensitive. Chikenpox as a child.      ED Course:  Objective findings below. NOted to have a shingles rash and started on Acyclovir. GI consulted for GI bleed.   Hospital Course:  1-GI bleed -Hemodynamically stable -Patient with recent admission secondary to GI bleed undergo endoscopic evaluation and polypectomy -Reports an episode of constipation after discharge and in the using over-the-counter laxatives that trigger relieves of constipation episode with a subsequent bright red blood per rectum. -Currently without significant bleeding and is stable hemoglobin level -Case discussed with gastroenterology service who recommended supportive care only. -Patient advised to minimize the use of NSAIDs and to follow-up with PCP and GI as an outpatient. -If needed MiraLAX 17 g daily and the use of Anusol rectal cream twice a day for 7 days recommended by GI service.  2-end-stage renal disease (Tuesday-Thursday and Saturday) -Continue home torsemide -Electrolytes within normal limit -Limited chances for dialysis as an inpatient today and is stable to pursued special treatment on her dialysis unit tomorrow 05/12/2021; case discussed and approved by nephrology service to pursue that route.  3-type 2 diabetes mellitus -Continue home hypoglycemic regimen and follow modified carbohydrate diet. -Continue outpatient follow-up with PCP to further adjust her treatment.  4-essential hypertension -Continue metoprolol -Advised to follow low-sodium diet.  5-hyperlipidemia and hypothyroidism -Continue statins and Synthroid.  6-chronic calciphylaxis and chronic wound -Continue wound care -Continue keeping area clean and dry  7-acute shingles -Patient On  airborne precaution -Will be treated with valacyclovir for 7 days -Advised to avoid picking on her wounds to minimize chances for infection -Continue to follow response as an outpatient.  Procedures: None  Consultations: Nephrology service.  Discharge Exam: Vitals:   05/11/21 0249 05/11/21 0700   BP: 122/63 117/72  Pulse: 71 67  Resp: 16 20  Temp: 98.4 F (36.9 C) 98.4 F (36.9 C)  SpO2: 100% 99%    General: No fever, no nausea, no vomiting, no shortness of breath.  Patient expressing left-sided chest discomfort secondary to shingles. Cardiovascular: S1 and S2; no rubs, no gallops, no JVD. Respiratory: Clear to auscultation; no using accessory muscles.  Normal respiratory effort Abdomen: Soft, nontender, nondistended, positive bowel sounds Extremities: No cyanosis or clubbing Skin: With positive herpes zoster rash affecting thoracic dermatomes on the left side of her chest.  Discharge Instructions   Discharge Instructions     Diet - low sodium heart healthy   Complete by: As directed    Discharge instructions   Complete by: As directed    Follow renal and modify carbohydrate diet -Please present tomorrow 05/12/2021 to your dialysis unit for a special dialysis treatment prior to resume normal schedule. Arrange follow-up with PCP in 10 days Take medications as prescribed Maintain adequate hydration.   Discharge wound care:   Complete by: As directed    Continue to follow recommendations for wound care on daily basis; keep area clean and dry and continue applying wound dressings.      Allergies as of 05/11/2021       Reactions   Ace Inhibitors Cough        Medication List     STOP taking these medications    sodium bicarbonate 650 MG tablet       TAKE these medications    acetaminophen 500 MG tablet Commonly known as: TYLENOL Take 1,000 mg by mouth every 6 (six) hours as needed for moderate pain.   aspirin EC 81 MG tablet Take 1 tablet (81 mg total) by mouth daily with breakfast.   atorvastatin 10 MG tablet Commonly known as: LIPITOR Take 1 tablet (10 mg total) by mouth daily.   HumaLOG KwikPen 100 UNIT/ML KwikPen Generic drug: insulin lispro Inject 5-15 Units into the skin 3 (three) times daily with meals.   hydrocortisone 2.5 % rectal  cream Commonly known as: Anusol-HC Place 1 application rectally 2 (two) times daily for 7 days.   insulin glargine 100 UNIT/ML injection Commonly known as: LANTUS Inject 50 Units into the skin at bedtime.   levothyroxine 50 MCG tablet Commonly known as: SYNTHROID Take 1 tablet (50 mcg total) by mouth daily before breakfast.   metoprolol succinate 50 MG 24 hr tablet Commonly known as: TOPROL-XL Take 1 tablet (50 mg total) by mouth at bedtime. Take with or immediately following a meal.   pantoprazole 40 MG tablet Commonly known as: Protonix Take 1 tablet (40 mg total) by mouth 2 (two) times daily.   polyethylene glycol 17 g packet Commonly known as: MiraLax Take 17 g by mouth daily as needed for mild constipation.   sevelamer carbonate 800 MG tablet Commonly known as: RENVELA Take 1 tablet (800 mg total) by mouth 3 (three) times daily with meals.   torsemide 100 MG tablet Commonly known as: DEMADEX Take 150 mg by mouth daily.   valACYclovir 500 MG tablet Commonly known as: VALTREX Take 1 tablet (500 mg total) by mouth daily for 7 days. Start taking on: May 12, 2021   Vitamin D (Ergocalciferol) 1.25 MG (50000 UNIT) Caps capsule Commonly known as: DRISDOL Take 1 capsule by mouth every 14 (fourteen) days.  Discharge Care Instructions  (From admission, onward)           Start     Ordered   05/11/21 0000  Discharge wound care:       Comments: Continue to follow recommendations for wound care on daily basis; keep area clean and dry and continue applying wound dressings.   05/11/21 1815           Allergies  Allergen Reactions   Ace Inhibitors Cough    Follow-up Information     Leeanne Rio, MD. Schedule an appointment as soon as possible for a visit in 10 day(s).   Specialty: Family Medicine Contact information: [place] [phone]                  The results of significant diagnostics  from this hospitalization (including imaging, microbiology, ancillary and laboratory) are listed below for reference.    Significant Diagnostic Studies: No results found.  Microbiology: Recent Results (from the past 240 hour(s))  Resp Panel by RT-PCR (Flu A&B, Covid) Nasopharyngeal Swab     Status: None   Collection Time: 05/10/21  7:49 PM   Specimen: Nasopharyngeal Swab; Nasopharyngeal(NP) swabs in vial transport medium  Result Value Ref Range Status   SARS Coronavirus 2 by RT PCR NEGATIVE NEGATIVE Final    Comment: (NOTE) SARS-CoV-2 target nucleic acids are NOT DETECTED.  The SARS-CoV-2 RNA is generally detectable in upper respiratory specimens during the acute phase of infection. The lowest concentration of SARS-CoV-2 viral copies this assay can detect is 138 copies/mL. A negative result does not preclude SARS-Cov-2 infection and should not be used as the sole basis for treatment or other patient management decisions. A negative result may occur with  improper specimen collection/handling, submission of specimen other than nasopharyngeal swab, presence of viral mutation(s) within the areas targeted by this assay, and inadequate number of viral copies(<138 copies/mL). A negative result must be combined with clinical observations, patient history, and epidemiological information. The expected result is Negative.  Fact Sheet for Patients:  EntrepreneurPulse.com.au  Fact Sheet for Healthcare Providers:  IncredibleEmployment.be  This test is no t yet approved or cleared by the [place] FDA and  has been authorized for detection and/or diagnosis of SARS-CoV-2 by FDA under an Emergency Use Authorization (EUA). This EUA will remain  in effect (meaning this test can be used) for the duration of the COVID-19 declaration under Section 564(b)(1) of the Act, 21 U.S.C.section 360bbb-3(b)(1), unless the authorization is terminated  or revoked  sooner.       Influenza A by PCR NEGATIVE NEGATIVE Final   Influenza B by PCR NEGATIVE NEGATIVE Final    Comment: (NOTE) The Xpert Xpress SARS-CoV-2/FLU/RSV plus assay is intended as an aid in the diagnosis of influenza from Nasopharyngeal swab specimens and should not be used as a sole basis for treatment. Nasal washings and aspirates are unacceptable for Xpert Xpress SARS-CoV-2/FLU/RSV testing.  Fact Sheet for Patients: EntrepreneurPulse.com.au  Fact Sheet for Healthcare Providers: IncredibleEmployment.be  This test is not yet approved or cleared by the [place] FDA and has been authorized for detection and/or diagnosis of SARS-CoV-2 by FDA under an Emergency Use Authorization (EUA). This EUA will remain in effect (meaning this test can be used) for the duration of the COVID-19 declaration under Section 564(b)(1) of the Act, 21 U.S.C. section 360bbb-3(b)(1), unless the authorization is terminated or revoked.  Performed at [facility], [address], [place], [place]  [id number]      Labs: Basic Metabolic Panel: Recent Labs  Lab 05/10/21 1607 05/11/21 0512  NA 137 137  K 4.7 4.3  CL 93* 97*  CO2 26 25  GLUCOSE 267* 191*  BUN 40* 45*  CREATININE 5.16* 5.37*  CALCIUM 9.6 9.3  MG  --  2.8*  PHOS  --  5.6*   Liver Function Tests: Recent Labs  Lab 05/11/21 0512  AST 8*  ALT 8  ALKPHOS 111  BILITOT 0.7  PROT 5.6*  ALBUMIN 2.6*   CBC: Recent Labs  Lab 05/10/21 1607 05/11/21 0135 05/11/21 0512 05/11/21 0923 05/11/21 1647  WBC 7.3  --  8.9  --   --   NEUTROABS 5.2  --   --   --   --   HGB 10.1* 9.0* 8.7* 9.2* 9.5*  HCT 33.3* 30.2* 29.0* 30.8* 31.5*  MCV 87.9  --  87.3  --   --   PLT 318  --  296  --   --     BNP (last 3 results) Recent Labs    03/30/21 1658  BNP 1,068.9*    CBG: Recent Labs  Lab 05/10/21 2310 05/11/21 0830 05/11/21 1125 05/11/21 1621  GLUCAP 188* 133* 76 108*     Signed:  Barton Dubois MD.  [facility] 05/11/2021, 6:16 PM

## 2021-05-11 NOTE — Progress Notes (Signed)
Discharge education and IV removed by dayshift RN. Pt requested bedpan at shift change. Pt cleaned and taken downstairs to her ride home by tech.

## 2021-05-12 ENCOUNTER — Telehealth: Payer: Self-pay | Admitting: Internal Medicine

## 2021-05-12 ENCOUNTER — Inpatient Hospital Stay (HOSPITAL_COMMUNITY)
Admission: EM | Admit: 2021-05-12 | Discharge: 2021-05-15 | DRG: 919 | Disposition: A | Discharge status: Home Health | Payer: Medicare Other | Attending: Family Medicine | Admitting: Family Medicine

## 2021-05-12 ENCOUNTER — Inpatient Hospital Stay (HOSPITAL_COMMUNITY): Payer: Medicare Other

## 2021-05-12 ENCOUNTER — Other Ambulatory Visit: Payer: Self-pay

## 2021-05-12 ENCOUNTER — Encounter (HOSPITAL_COMMUNITY): Payer: Self-pay

## 2021-05-12 DIAGNOSIS — Z79890 Hormone replacement therapy (postmenopausal): Secondary | ICD-10-CM | POA: Diagnosis not present

## 2021-05-12 DIAGNOSIS — K91840 Postprocedural hemorrhage and hematoma of a digestive system organ or structure following a digestive system procedure: Principal | ICD-10-CM | POA: Diagnosis present

## 2021-05-12 DIAGNOSIS — Z20822 Contact with and (suspected) exposure to covid-19: Secondary | ICD-10-CM | POA: Diagnosis present

## 2021-05-12 DIAGNOSIS — I132 Hypertensive heart and chronic kidney disease with heart failure and with stage 5 chronic kidney disease, or end stage renal disease: Secondary | ICD-10-CM | POA: Diagnosis present

## 2021-05-12 DIAGNOSIS — H5461 Unqualified visual loss, right eye, normal vision left eye: Secondary | ICD-10-CM | POA: Diagnosis present

## 2021-05-12 DIAGNOSIS — H4010X0 Unspecified open-angle glaucoma, stage unspecified: Secondary | ICD-10-CM | POA: Diagnosis present

## 2021-05-12 DIAGNOSIS — Y838 Other surgical procedures as the cause of abnormal reaction of the patient, or of later complication, without mention of misadventure at the time of the procedure: Secondary | ICD-10-CM | POA: Diagnosis present

## 2021-05-12 DIAGNOSIS — Z841 Family history of disorders of kidney and ureter: Secondary | ICD-10-CM

## 2021-05-12 DIAGNOSIS — N186 End stage renal disease: Secondary | ICD-10-CM | POA: Diagnosis present

## 2021-05-12 DIAGNOSIS — Z79899 Other long term (current) drug therapy: Secondary | ICD-10-CM | POA: Diagnosis not present

## 2021-05-12 DIAGNOSIS — E785 Hyperlipidemia, unspecified: Secondary | ICD-10-CM | POA: Diagnosis present

## 2021-05-12 DIAGNOSIS — Z833 Family history of diabetes mellitus: Secondary | ICD-10-CM | POA: Diagnosis not present

## 2021-05-12 DIAGNOSIS — E559 Vitamin D deficiency, unspecified: Secondary | ICD-10-CM | POA: Diagnosis present

## 2021-05-12 DIAGNOSIS — M898X9 Other specified disorders of bone, unspecified site: Secondary | ICD-10-CM | POA: Diagnosis present

## 2021-05-12 DIAGNOSIS — Z8249 Family history of ischemic heart disease and other diseases of the circulatory system: Secondary | ICD-10-CM

## 2021-05-12 DIAGNOSIS — E039 Hypothyroidism, unspecified: Secondary | ICD-10-CM | POA: Diagnosis present

## 2021-05-12 DIAGNOSIS — K922 Gastrointestinal hemorrhage, unspecified: Secondary | ICD-10-CM | POA: Diagnosis not present

## 2021-05-12 DIAGNOSIS — E118 Type 2 diabetes mellitus with unspecified complications: Secondary | ICD-10-CM

## 2021-05-12 DIAGNOSIS — E1165 Type 2 diabetes mellitus with hyperglycemia: Secondary | ICD-10-CM

## 2021-05-12 DIAGNOSIS — Z992 Dependence on renal dialysis: Secondary | ICD-10-CM | POA: Diagnosis not present

## 2021-05-12 DIAGNOSIS — Z794 Long term (current) use of insulin: Secondary | ICD-10-CM | POA: Diagnosis not present

## 2021-05-12 DIAGNOSIS — K921 Melena: Secondary | ICD-10-CM | POA: Diagnosis not present

## 2021-05-12 DIAGNOSIS — K625 Hemorrhage of anus and rectum: Secondary | ICD-10-CM | POA: Diagnosis present

## 2021-05-12 DIAGNOSIS — H547 Unspecified visual loss: Secondary | ICD-10-CM

## 2021-05-12 DIAGNOSIS — D62 Acute posthemorrhagic anemia: Secondary | ICD-10-CM | POA: Diagnosis present

## 2021-05-12 DIAGNOSIS — IMO0002 Reserved for concepts with insufficient information to code with codable children: Secondary | ICD-10-CM

## 2021-05-12 DIAGNOSIS — I5032 Chronic diastolic (congestive) heart failure: Secondary | ICD-10-CM | POA: Diagnosis present

## 2021-05-12 DIAGNOSIS — S91301D Unspecified open wound, right foot, subsequent encounter: Secondary | ICD-10-CM

## 2021-05-12 DIAGNOSIS — E782 Mixed hyperlipidemia: Secondary | ICD-10-CM | POA: Diagnosis not present

## 2021-05-12 DIAGNOSIS — K621 Rectal polyp: Secondary | ICD-10-CM | POA: Diagnosis not present

## 2021-05-12 DIAGNOSIS — E875 Hyperkalemia: Secondary | ICD-10-CM | POA: Diagnosis not present

## 2021-05-12 DIAGNOSIS — E1122 Type 2 diabetes mellitus with diabetic chronic kidney disease: Secondary | ICD-10-CM | POA: Diagnosis present

## 2021-05-12 DIAGNOSIS — Z7982 Long term (current) use of aspirin: Secondary | ICD-10-CM

## 2021-05-12 DIAGNOSIS — B029 Zoster without complications: Secondary | ICD-10-CM | POA: Diagnosis present

## 2021-05-12 DIAGNOSIS — Z888 Allergy status to other drugs, medicaments and biological substances status: Secondary | ICD-10-CM | POA: Diagnosis not present

## 2021-05-12 DIAGNOSIS — E119 Type 2 diabetes mellitus without complications: Secondary | ICD-10-CM

## 2021-05-12 LAB — CBC WITH DIFFERENTIAL/PLATELET
Abs Immature Granulocytes: 0.04 10*3/uL (ref 0.00–0.07)
Basophils Absolute: 0 10*3/uL (ref 0.0–0.1)
Basophils Relative: 1 %
Eosinophils Absolute: 0.2 10*3/uL (ref 0.0–0.5)
Eosinophils Relative: 2 %
HCT: 23.9 % — ABNORMAL LOW (ref 36.0–46.0)
Hemoglobin: 7.2 g/dL — ABNORMAL LOW (ref 12.0–15.0)
Immature Granulocytes: 1 %
Lymphocytes Relative: 19 %
Lymphs Abs: 1.6 10*3/uL (ref 0.7–4.0)
MCH: 26.6 pg (ref 26.0–34.0)
MCHC: 30.1 g/dL (ref 30.0–36.0)
MCV: 88.2 fL (ref 80.0–100.0)
Monocytes Absolute: 0.7 10*3/uL (ref 0.1–1.0)
Monocytes Relative: 8 %
Neutro Abs: 5.9 10*3/uL (ref 1.7–7.7)
Neutrophils Relative %: 69 %
Platelets: 328 10*3/uL (ref 150–400)
RBC: 2.71 MIL/uL — ABNORMAL LOW (ref 3.87–5.11)
RDW: 16.5 % — ABNORMAL HIGH (ref 11.5–15.5)
WBC: 8.4 10*3/uL (ref 4.0–10.5)
nRBC: 0 % (ref 0.0–0.2)

## 2021-05-12 LAB — RESP PANEL BY RT-PCR (FLU A&B, COVID) ARPGX2
Influenza A by PCR: NEGATIVE
Influenza B by PCR: NEGATIVE
SARS Coronavirus 2 by RT PCR: NEGATIVE

## 2021-05-12 LAB — COMPREHENSIVE METABOLIC PANEL
ALT: 8 U/L (ref 0–44)
AST: 9 U/L — ABNORMAL LOW (ref 15–41)
Albumin: 2.7 g/dL — ABNORMAL LOW (ref 3.5–5.0)
Alkaline Phosphatase: 108 U/L (ref 38–126)
Anion gap: 17 — ABNORMAL HIGH (ref 5–15)
BUN: 55 mg/dL — ABNORMAL HIGH (ref 6–20)
CO2: 21 mmol/L — ABNORMAL LOW (ref 22–32)
Calcium: 9.2 mg/dL (ref 8.9–10.3)
Chloride: 100 mmol/L (ref 98–111)
Creatinine, Ser: 6.51 mg/dL — ABNORMAL HIGH (ref 0.44–1.00)
GFR, Estimated: 7 mL/min — ABNORMAL LOW (ref >60)
Glucose, Bld: 170 mg/dL — ABNORMAL HIGH (ref 70–99)
Potassium: 5.1 mmol/L (ref 3.5–5.1)
Sodium: 138 mmol/L (ref 135–145)
Total Bilirubin: 0.4 mg/dL (ref 0.3–1.2)
Total Protein: 5.7 g/dL — ABNORMAL LOW (ref 6.5–8.1)

## 2021-05-12 LAB — PREPARE RBC (CROSSMATCH)

## 2021-05-12 LAB — POC OCCULT BLOOD, ED: Fecal Occult Bld: POSITIVE — AB

## 2021-05-12 MED ORDER — SODIUM CHLORIDE 0.9 % IV SOLN
20.0000 ug | Freq: Once | INTRAVENOUS | Status: DC
  Filled 2021-05-12: qty 5

## 2021-05-12 MED ORDER — PANTOPRAZOLE SODIUM 40 MG IV SOLR
40.0000 mg | Freq: Two times a day (BID) | INTRAVENOUS | Status: DC
  Administered 2021-05-13 – 2021-05-15 (×5): 40 mg via INTRAVENOUS
  Filled 2021-05-12 (×5): qty 40

## 2021-05-12 MED ORDER — CHLORHEXIDINE GLUCONATE CLOTH 2 % EX PADS
6.0000 | MEDICATED_PAD | Freq: Every day | CUTANEOUS | Status: DC
  Administered 2021-05-14: 6 via TOPICAL

## 2021-05-12 MED ORDER — INSULIN ASPART 100 UNIT/ML IJ SOLN
0.0000 [IU] | Freq: Every day | INTRAMUSCULAR | Status: DC
  Administered 2021-05-13: 2 [IU] via SUBCUTANEOUS

## 2021-05-12 MED ORDER — TECHNETIUM TC 99M-LABELED RED BLOOD CELLS IV KIT
25.0000 | PACK | Freq: Once | INTRAVENOUS | Status: AC | PRN
  Administered 2021-05-12: 24.5 via INTRAVENOUS

## 2021-05-12 MED ORDER — INSULIN ASPART 100 UNIT/ML IJ SOLN
0.0000 [IU] | Freq: Three times a day (TID) | INTRAMUSCULAR | Status: DC
  Administered 2021-05-13: 1 [IU] via SUBCUTANEOUS

## 2021-05-12 MED ORDER — SODIUM CHLORIDE 0.9% IV SOLUTION: Freq: Once | INTRAVENOUS | Status: AC

## 2021-05-12 MED ORDER — HEPARIN SOD (PORK) LOCK FLUSH 100 UNIT/ML IV SOLN
INTRAVENOUS | Status: AC
  Filled 2021-05-12: qty 5

## 2021-05-12 MED ORDER — METOPROLOL SUCCINATE ER 50 MG PO TB24
50.0000 mg | ORAL_TABLET | Freq: Every day | ORAL | Status: DC
  Administered 2021-05-13: 50 mg via ORAL
  Filled 2021-05-12 (×2): qty 1

## 2021-05-12 NOTE — Telephone Encounter (Signed)
[phone]  PLEASE CALL PATIENT, SHE HAS AN APPOINTMENT NEXT WEEK WITH DR. Abbey Chatters, BUT SHE SAYS THAT SHE IS PASSING BLOOD CLOTS WHEN SHE GOES TO THE BATHROOM

## 2021-05-12 NOTE — ED Triage Notes (Signed)
Pt reports passing "a lot of blood, like clots". States she's been dizzy and weak as well. Discharged yesterday for same.

## 2021-05-12 NOTE — ED Notes (Signed)
Patient transported to [facility] 

## 2021-05-12 NOTE — ED Provider Notes (Signed)
Emergency Medicine Provider Triage Evaluation Note  Amy Moses , a 50 y.o. female  was evaluated in triage.  Pt complains of rectal bleeding.  Patient states that she was seen here last night for the same thing, however over the night her rectal bleeding became worse.  Now passing multiple large blood clots.  Patient states that also she feels dizzy and weak as well.  Also admits to some nausea.  Denies any abdominal pain.  States this is started about 3 and half weeks ago, did get a colonoscopy done.   Review of Systems  Positive: Rectal bleeding, dizzy, weak Negative: Abdominal pain  Physical Exam  BP (!) 104/54 (BP Location: Right Arm)   Pulse 64   Temp (!) 95.6 F (35.3 C) (Oral)   Resp 18   Ht '5\' 6"'$  (1.676 m)   Wt 80.4 kg   LMP 08/22/2015 (Approximate)   SpO2 100%   BMI 28.61 kg/m  Gen:   Patient looks uncomfortable Resp:  Normal effort  MSK:   Moves extremities without difficulty    Medical Decision Making  Medically screening exam initiated at 3:27 PM.  Appropriate orders placed.  [name] was informed that the remainder of the evaluation will be completed by another provider, this initial triage assessment does not replace that evaluation, and the importance of remaining in the ED until their evaluation is complete.  Oral temperature 95.6, nursing aware that patient needs to be next.  Will obtain rectal temperature at that time.  Blood pressure 104/54.   Alfredia Client, PA-C 05/12/21 1529    Luna Fuse, MD 05/12/21 2202

## 2021-05-12 NOTE — Telephone Encounter (Signed)
Spoke with pt.  She informed me that she has dialysis today but is passing blood clots the size of 50 cent pieces.  She said they are coming out like diarrhea does.  Dizzy and lightheaded.  OV with [name] 05/19/2021.  Informed pt that she needs to skip dialysis and got to the ER.  Pt voiced understanding.

## 2021-05-12 NOTE — H&P (Signed)
TR[facility] H&P   Patient Demographics:    Amy Moses, is a 50 y.o. female  MRN: [id number]   DOB - [birth year]  Admit Date - 05/12/2021  Outpatient Primary MD for the patient is Leeanne Rio, MD  Referring MD/NP/PA: PA PAtel    Patient coming from: HOME  Chief Complaint  Patient presents with   Rectal Bleeding      HPI:    Amy Moses  is a 50 y.o. female, with medical history significant of Anemia, Blind R eye, DM, HLD, HTN, hypothyroidism, ESRD on dialysis. Dialysis T/TH/Sat, patient was just discharged yesterday due to lower GI bleed, patient with recent hospitalization 6/3 for GI bleed, s/p endoscopy/colonoscopy, significant for gastritis, duodenal erosions and polyp with bleeding stigmata, status post polypectomy, patient was readmitted on 6/20 for  another episode of bright red blood per rectum, but this has resolved without intervention, and her hemoglobin has been stable, she so she was discharged 6/21, patient reports significant amount of bright red blood per rectum overnight, as well she does report dizziness, lightheadedness, and some shortness of breath, which prompted her to come to the ED, of note last hemodialysis was on Saturday, and patient missed her hemodialysis today, but reports she is making urine, she denies any abdominal pain, loose stools, chest pain, palpitation. -In ED her hemoglobin was noted to be low at 7.2, down from 9.5 upon discharge yesterday afternoon, she had Hemoccult positive stool, she had significant hematochezia in the commode, her potassium was 5.1, BUN was 55, [facility] hospitalist consulted to admit.      Review of systems:    In addition to the HPI above,  No Fever-chills, does report generalized weakness, fatigue and dizziness No Headache, No changes with Vision or hearing, No problems swallowing food or Liquids, No Chest pain,  Cough, reports mild dyspnea No Abdominal pain, No Nausea or Vommitting, Bowel movements are regular, Reports bright red blood in stools No dysuria, No new skin rashes or bruises, No new joints pains-aches,  No new weakness, tingling, numbness in any extremity, No recent weight gain or loss, No polyuria, polydypsia or polyphagia, No significant Mental Stressors.  A full 10 point Review of Systems was done, except as stated above, all other Review of Systems were negative.   With Past History of the following :    Past Medical History:  Diagnosis Date   Anemia    Blindness of right eye with low vision in contralateral eye    s/p victrectomy   Diabetes mellitus, type II ([place])    Dyslipidemia    Glaucoma    Hypertension    Hypothyroidism (acquired)    Kidney disease    Stage 5   Pneumonia       Past Surgical History:  Procedure Laterality Date   ABDOMINAL AORTOGRAM W/LOWER EXTREMITY Bilateral 12/18/2020   Procedure: ABDOMINAL AORTOGRAM W/LOWER EXTREMITY;  Surgeon:  Elam Dutch, MD;  Location: [facility];  Service: Cardiovascular;  Laterality: Bilateral;   ANKLE FRACTURE SURGERY     AV FISTULA PLACEMENT Left 08/18/2020   Procedure: LEFT ARM BRACHIOCEPHALIC ARTERIOVENOUS (AV) FISTULA CREATION;  Surgeon: Elam Dutch, MD;  Location: [place];  Service: Vascular;  Laterality: Left;   BIOPSY  04/24/2021   Procedure: BIOPSY;  Surgeon: Eloise Harman, DO;  [facility]: [facility];  Service: Endoscopy;;   CESAREAN SECTION     CHOLECYSTECTOMY     COLONOSCOPY  04/24/2021   Procedure: COLONOSCOPY;  Surgeon: Eloise Harman, DO;  [facility]: [facility];  Service: Endoscopy;;   ESOPHAGOGASTRODUODENOSCOPY (EGD) WITH PROPOFOL N/A 04/24/2021   Procedure: ESOPHAGOGASTRODUODENOSCOPY (EGD) WITH PROPOFOL;  Surgeon: Eloise Harman, DO;  [facility];  Service: Endoscopy;  Laterality: N/A;   EYE SURGERY     Vatrectomy   IR PERC TUN PERIT CATH WO PORT S&I /IMAG   09/15/2020   IR REMOVAL TUN CV CATH W/O FL  02/19/2021   IR US GUIDE VASC ACCESS RIGHT  09/15/2020   POLYPECTOMY  04/24/2021   Procedure: POLYPECTOMY;  Surgeon: Eloise Harman, DO;  Location: [facility];  Service: Endoscopy;;   TOE SURGERY        Social History:     Social History   Tobacco Use   Smoking status: Never   Smokeless tobacco: Never  Substance Use Topics   Alcohol use: No      Family History :     Family History  Problem Relation Age of Onset   Heart disease Mother    Diabetes Mother    Kidney disease Mother    Diabetes Father    Heart disease Father    Diabetes Brother    Colon cancer Neg Hx       Home Medications:   Prior to Admission medications   Medication Sig Start Date End Date Taking? Authorizing Provider  acetaminophen (TYLENOL) 500 MG tablet Take 1,000 mg by mouth every 6 (six) hours as needed for moderate pain.   Yes [provider]  aspirin EC 81 MG tablet Take 1 tablet (81 mg total) by mouth daily with breakfast. 04/25/21 04/25/22 Yes Emokpae, Courage, MD  atorvastatin (LIPITOR) 10 MG tablet Take 1 tablet (10 mg total) by mouth daily. 04/25/21  Yes Emokpae, Courage, MD  HUMALOG KWIKPEN 100 UNIT/ML KwikPen Inject 5-15 Units into the skin 3 (three) times daily with meals. 03/03/20  Yes [provider]  hydrocortisone (ANUSOL-HC) 2.5 % rectal cream Place 1 application rectally 2 (two) times daily for 7 days. 05/11/21 05/18/21 Yes Barton Dubois, MD  insulin glargine (LANTUS) 100 UNIT/ML injection Inject 50 Units into the skin at bedtime.   Yes [provider]  levothyroxine (SYNTHROID) 50 MCG tablet Take 1 tablet (50 mcg total) by mouth daily before breakfast. 04/25/21  Yes Emokpae, Courage, MD  metoprolol succinate (TOPROL-XL) 50 MG 24 hr tablet Take 1 tablet (50 mg total) by mouth at bedtime. Take with or immediately following a meal. 04/25/21  Yes Emokpae, Courage, MD  pantoprazole (PROTONIX) 40 MG tablet Take 1 tablet (40 mg  total) by mouth 2 (two) times daily. 04/25/21 04/25/22 Yes Emokpae, Courage, MD  polyethylene glycol (MIRALAX) 17 g packet Take 17 g by mouth daily as needed for mild constipation. 05/11/21  Yes Barton Dubois, MD  sevelamer carbonate (RENVELA) 800 MG tablet Take 1 tablet (800 mg total) by mouth 3 (three) times daily  with meals. 04/25/21  Yes Emokpae, Courage, MD  torsemide (DEMADEX) 100 MG tablet Take 150 mg by mouth daily.  05/22/20  Yes [provider]  valACYclovir (VALTREX) 500 MG tablet Take 1 tablet (500 mg total) by mouth daily for 7 days. 05/12/21 05/19/21 Yes Barton Dubois, MD  Vitamin D, Ergocalciferol, (DRISDOL) 1.25 MG (50000 UNIT) CAPS capsule Take 1 capsule by mouth every 14 (fourteen) days. 04/20/21  Yes [provider]     Allergies:     Allergies  Allergen Reactions   Ace Inhibitors Cough     Physical Exam:   Vitals  Blood pressure (!) 116/48, pulse 68, temperature 97.6 F (36.4 C), temperature source Oral, resp. rate (!) 25, height '5\' 6"'$  (1.676 m), weight 80.4 kg, last menstrual period 08/22/2015, SpO2 91 %.   1. General developed female lying in bed in NAD,   2. Normal affect and insight, Not Suicidal or Homicidal, Awake Alert, Oriented X 3.  3. No F.N deficits, ALL C.Nerves Intact, Strength 5/5 all 4 extremities, Sensation intact all 4 extremities, Plantars down going.  4.  Right eye is blind, moist Oral Mucosa.  5. Supple Neck, No JVD, No cervical lymphadenopathy appriciated, No Carotid Bruits.  6. Symmetrical Chest wall movement, Good air movement bilaterally, CTAB.  7. RRR, No Gallops, Rubs or Murmurs, No Parasternal Heave.  8. Positive Bowel Sounds, Abdomen Soft, No tenderness, No organomegaly appriciated,No rebound -guarding or rigidity.  9.  No Cyanosis, Normal Skin Turgor, patient with dried crusted skin rash and left shoulder area, and multiple areas of calciphylaxis bandaged in both bilateral lower extremity and upper thigh, medial thigh,  calves  and heel area  10. Good muscle tone,  joints appear normal , no effusions, Normal ROM.  11. No Palpable Lymph Nodes in Neck or Axillae    Data Review:    CBC Recent Labs  Lab 05/10/21 1607 05/11/21 0135 05/11/21 0512 05/11/21 0923 05/11/21 1647 05/12/21 1527  WBC 7.3  --  8.9  --   --  8.4  HGB 10.1* 9.0* 8.7* 9.2* 9.5* 7.2*  HCT 33.3* 30.2* 29.0* 30.8* 31.5* 23.9*  PLT 318  --  296  --   --  328  MCV 87.9  --  87.3  --   --  88.2  MCH 26.6  --  26.2  --   --  26.6  MCHC 30.3  --  30.0  --   --  30.1  RDW 16.2*  --  16.3*  --   --  16.5*  LYMPHSABS 1.2  --   --   --   --  1.6  MONOABS 0.7  --   --   --   --  0.7  EOSABS 0.2  --   --   --   --  0.2  BASOSABS 0.1  --   --   --   --  0.0   ------------------------------------------------------------------------------------------------------------------  Chemistries  Recent Labs  Lab 05/10/21 1607 05/11/21 0512 05/12/21 1527  NA 137 137 138  K 4.7 4.3 5.1  CL 93* 97* 100  CO2 26 25 21*  GLUCOSE 267* 191* 170*  BUN 40* 45* 55*  CREATININE 5.16* 5.37* 6.51*  CALCIUM 9.6 9.3 9.2  MG  --  2.8*  --   AST  --  8* 9*  ALT  --  8 8  ALKPHOS  --  111 108  BILITOT  --  0.7 0.4   ------------------------------------------------------------------------------------------------------------------ estimated creatinine clearance is  11.4 mL/min (A) (by C-G formula based on SCr of 6.51 mg/dL (H)). ------------------------------------------------------------------------------------------------------------------ No results for input(s): TSH, T4TOTAL, T3FREE, THYROIDAB in the last 72 hours.  Invalid input(s): FREET3  Coagulation profile No results for input(s): INR, PROTIME in the last 168 hours. ------------------------------------------------------------------------------------------------------------------- No results for input(s): DDIMER in the last 72  hours. -------------------------------------------------------------------------------------------------------------------  Cardiac Enzymes No results for input(s): CKMB, TROPONINI, MYOGLOBIN in the last 168 hours.  Invalid input(s): CK ------------------------------------------------------------------------------------------------------------------    Component Value Date/Time   BNP 1,068.9 (H) 03/30/2021 1658     ---------------------------------------------------------------------------------------------------------------  Urinalysis No results found for: COLORURINE, APPEARANCEUR, LABSPEC, PHURINE, GLUCOSEU, HGBUR, BILIRUBINUR, KETONESUR, PROTEINUR, UROBILINOGEN, NITRITE, LEUKOCYTESUR  ----------------------------------------------------------------------------------------------------------------   Imaging Results:    No results found.  My personal review of EKG:   Vent. rate 67 BPM PR interval 127 ms QRS duration 82 ms QT/QTcB 445/470 ms P-R-T axes 64 75 87 Sinus rhythm   Assessment & Plan:    Active Problems:   Uncontrolled type 2 diabetes mellitus with chronic kidney disease, with long-term current use of insulin (HCC)   Hyperlipidemia   Primary hypothyroidism   Calciphylaxis   Acute blood loss anemia   Blindness   Shingles   Lower GI bleed  Acute blood loss anemia due to acute lower GI bleed -Patient presents with worsening bright red blood per rectum over last 24 hours, hemoglobin dropped from 9.5>7.2, she is symptomatic with fatigue, and blood pressure on the lower side, so I will transfuse 1 unit PRBC, and will repeat H&H posttransfusion and transfuse further if needed. -Recent colonoscopy/endoscopy 6/3 significant for polyp with blood clots status post polypectomy, gastritis and duodenal erosions, I have discussed with GI on-call Dr. Laural Golden, will proceed with VQ scan to localize location of her bleed given she is 18 days post polypectomy, and it would be  unlikely source of bleed at this point (trying to avoid CT angio given she still making urine). -Hold aspirin. - Will give 1 dose of desmopressin -We will start on Protonix 40 mg IV twice daily given history of gastritis/duodenal erosions even though clinically this is most likely lower GI bleed -Keep on clear liquid diet overnight as discussed with GI  End-stage renal disease -He is on hemodialysis TTS schedule, most recent dialysis was on Saturday, have discussed with renal on-call, plan to dialyze in a.m., at this point no need for emergent dialysis has no uremia symptoms, no volume overload or respiratory distress. -Likely will benefit from Procrit and IV iron, but will defer till she is seen by renal  Type 2 diabetes mellitus -Will resume on Lantus lower dose 50> 30 units nightly, will keep on insulin sliding scale  hyperlipidemia and hypothyroidism -Continue statins and Synthroid.   chronic calciphylaxis and chronic wound -Continue wound care, will consult wound care    Shingles - during recent hospitalization,  -Patient On  airborne precaution -Will need valcyclovir.  DVT Prophylaxis SCDs as she able to tolerate given her calciphylaxis  AM Labs Ordered, also please review Full Orders  Family Communication: Admission, patients condition and plan of care including tests being ordered have been discussed with the patient  who indicate understanding and agree with the plan and Code Status.  Code Status Full  Likely DC to  Home  Condition GUARDED    Consults called: GI and renal    Admission status: inpatient    Time spent in minutes : 65 minutes   Phillips Climes M.D on 05/12/2021 at 6:02 PM   [facility] - Office  [phone]

## 2021-05-12 NOTE — ED Provider Notes (Signed)
[facility] EMERGENCY DEPARTMENT Provider Note   CSN: [id number] Arrival date & time: 05/12/21  1438     History Chief Complaint  Patient presents with   Rectal Bleeding    Amy Moses is a 50 y.o. female with pertinent past medical history of with pertinent past medical history significant for anemia due to CKD, blind right eye, diabetes, hyperlipidemia, hypertension, hypothyroidism, ESRD on dialysis Tuesday Thursday Saturday.  Patient states that her last dialysis was on Saturday, did not go to the one on Tuesday since she was in the hospital, was supposed to go today, however did not because she did not feel well.  Patient presents to the emerge department today for continued bloody bowel movements, worsening since she was discharged last night.  Patient states that the entire bowl was filled with multiple blood clots and now she feels dizzy and lightheaded.  Denies any abdominal pain, loose stools, chest pain, palpitations, shortness of breath.    Prior to this, patient was discharged from the hospital on 6/5 previous to this after 3-day admission for GI bleed and underwent colonoscopy with polypectomy which was thought to be the cause of the bleeding.  At the time patient was discharged yesterday she was doing well with hemoglobin of 9.  Per chart review GI was consulted who recommended supportive care only during the visit.  Did not perform repeat colonoscopy.  Patient did not require any blood transfusion during the visit. Not on blood thinners.   HPI     Past Medical History:  Diagnosis Date   Anemia    Blindness of right eye with low vision in contralateral eye    s/p victrectomy   Diabetes mellitus, type II ([place])    Dyslipidemia    Glaucoma    Hypertension    Hypothyroidism (acquired)    Kidney disease    Stage 5   Pneumonia     Patient Active Problem List   Diagnosis Date Noted   Lower GI bleed 05/12/2021   Blindness 05/10/2021   Shingles 05/10/2021   Acute GI  bleeding 04/24/2021   Acute blood loss anemia 04/23/2021   GI bleed 04/22/2021   Fever 03/31/2021   Acute on chronic heart failure with preserved ejection fraction (HFpEF) ([place]) 03/30/2021   Pressure injury of skin 03/30/2021   End stage renal disease ([place]) 11/16/2020   Calciphylaxis 11/06/2020   Non-healing open wound of heel 11/03/2020   Diabetic foot infection ([place]) 11/01/2020   Decubitus ulcer, heel 11/01/2020   Closed nondisplaced fracture of left patella [id number]   Metabolic acidosis [id number]   Acute on chronic renal failure ([place]) 06/10/2020   Symptomatic anemia 06/10/2020   Acute pericardial effusion 06/10/2020   Other acute nonsuppurative otitis media, unspecified ear 11/29/2019   Chronic kidney disease, stage 4 (severe) ([place]) 03/05/2019   Skin ulcer, limited to breakdown of skin ([place]) 01/28/2019   Vitamin D deficiency 01/28/2019   Uncontrolled type 2 diabetes mellitus with chronic kidney disease, with long-term current use of insulin ([place]) 09/21/2015   Hyperlipidemia 09/21/2015   Essential hypertension, benign 09/21/2015   Primary hypothyroidism 09/21/2015   Iris bomb 07/31/2012   Secondary angle-closure glaucoma 07/31/2012    Past Surgical History:  Procedure Laterality Date   ABDOMINAL AORTOGRAM W/LOWER EXTREMITY Bilateral 12/18/2020   Procedure: ABDOMINAL AORTOGRAM W/LOWER EXTREMITY;  Surgeon: Elam Dutch, MD;  Location: [place] CV LAB;  Service: Cardiovascular;  Laterality: Bilateral;   ANKLE FRACTURE SURGERY     AV FISTULA PLACEMENT Left  08/18/2020   Procedure: LEFT ARM BRACHIOCEPHALIC ARTERIOVENOUS (AV) FISTULA CREATION;  Surgeon: Elam Dutch, MD;  Location: [place];  Service: Vascular;  Laterality: Left;   BIOPSY  04/24/2021   Procedure: BIOPSY;  Surgeon: Eloise Harman, DO;  [facility];  Service: Endoscopy;;   CESAREAN SECTION     CHOLECYSTECTOMY     COLONOSCOPY  04/24/2021   Procedure: COLONOSCOPY;  Surgeon: Eloise Harman, DO;   [facility];  Service: Endoscopy;;   ESOPHAGOGASTRODUODENOSCOPY (EGD) WITH PROPOFOL N/A 04/24/2021   Procedure: ESOPHAGOGASTRODUODENOSCOPY (EGD) WITH PROPOFOL;  Surgeon: Eloise Harman, DO;  [facility];  Service: Endoscopy;  Laterality: N/A;   EYE SURGERY     Vatrectomy   IR PERC TUN PERIT CATH WO PORT S&I /IMAG  09/15/2020   IR REMOVAL TUN CV CATH W/O FL  02/19/2021   IR US GUIDE VASC ACCESS RIGHT  09/15/2020   POLYPECTOMY  04/24/2021   Procedure: POLYPECTOMY;  Surgeon: Eloise Harman, DO;  [facility];  Service: Endoscopy;;   TOE SURGERY       OB History   No obstetric history on file.     Family History  Problem Relation Age of Onset   Heart disease Mother    Diabetes Mother    Kidney disease Mother    Diabetes Father    Heart disease Father    Diabetes Brother    Colon cancer Neg Hx     Social History   Tobacco Use   Smoking status: Never   Smokeless tobacco: Never  Vaping Use   Vaping Use: Never used  Substance Use Topics   Alcohol use: No   Drug use: No    Home Medications Prior to Admission medications   Medication Sig Start Date End Date Taking? Authorizing Provider  acetaminophen (TYLENOL) 500 MG tablet Take 1,000 mg by mouth every 6 (six) hours as needed for moderate pain.   Yes [provider]  aspirin EC 81 MG tablet Take 1 tablet (81 mg total) by mouth daily with breakfast. 04/25/21 04/25/22 Yes Emokpae, Courage, MD  atorvastatin (LIPITOR) 10 MG tablet Take 1 tablet (10 mg total) by mouth daily. 04/25/21  Yes Emokpae, Courage, MD  HUMALOG KWIKPEN 100 UNIT/ML KwikPen Inject 5-15 Units into the skin 3 (three) times daily with meals. 03/03/20  Yes [provider]  hydrocortisone (ANUSOL-HC) 2.5 % rectal cream Place 1 application rectally 2 (two) times daily for 7 days. 05/11/21 05/18/21 Yes Barton Dubois, MD  insulin glargine (LANTUS) 100 UNIT/ML injection Inject 50 Units into the skin at bedtime.   Yes  [provider]  levothyroxine (SYNTHROID) 50 MCG tablet Take 1 tablet (50 mcg total) by mouth daily before breakfast. 04/25/21  Yes Emokpae, Courage, MD  metoprolol succinate (TOPROL-XL) 50 MG 24 hr tablet Take 1 tablet (50 mg total) by mouth at bedtime. Take with or immediately following a meal. 04/25/21  Yes Emokpae, Courage, MD  pantoprazole (PROTONIX) 40 MG tablet Take 1 tablet (40 mg total) by mouth 2 (two) times daily. 04/25/21 04/25/22 Yes Emokpae, Courage, MD  polyethylene glycol (MIRALAX) 17 g packet Take 17 g by mouth daily as needed for mild constipation. 05/11/21  Yes Barton Dubois, MD  sevelamer carbonate (RENVELA) 800 MG tablet Take 1 tablet (800 mg total) by mouth 3 (three) times daily with meals. 04/25/21  Yes Emokpae, Courage, MD  torsemide (DEMADEX) 100 MG tablet Take 150 mg by mouth daily.  05/22/20  Yes [provider]  valACYclovir (VALTREX) 500 MG tablet Take 1 tablet (500 mg total) by mouth daily for 7 days. 05/12/21 05/19/21 Yes Barton Dubois, MD  Vitamin D, Ergocalciferol, (DRISDOL) 1.25 MG (50000 UNIT) CAPS capsule Take 1 capsule by mouth every 14 (fourteen) days. 04/20/21  Yes [provider]    Allergies    Ace inhibitors  Review of Systems   Review of Systems  Constitutional:  Negative for chills, diaphoresis, fatigue and fever.  HENT:  Negative for congestion, sore throat and trouble swallowing.   Eyes:  Negative for pain and visual disturbance.  Respiratory:  Negative for cough, shortness of breath and wheezing.   Cardiovascular:  Negative for chest pain, palpitations and leg swelling.  Gastrointestinal:  Positive for blood in stool. Negative for abdominal distention, abdominal pain, diarrhea, nausea and vomiting.  Genitourinary:  Negative for difficulty urinating.  Musculoskeletal:  Negative for back pain, neck pain and neck stiffness.  Skin:  Negative for pallor.  Neurological:  Positive for dizziness and weakness. Negative for speech difficulty  and headaches.  Psychiatric/Behavioral:  Negative for confusion.    Physical Exam Updated Vital Signs BP 109/82   Pulse 70   Temp 97.7 F (36.5 C) (Oral)   Resp 20   Ht '5\' 6"'$  (1.676 m)   Wt 80.4 kg   LMP 08/22/2015 (Approximate)   SpO2 91%   BMI 28.61 kg/m   Physical Exam Constitutional:      General: She is not in acute distress.    Appearance: Normal appearance. She is not ill-appearing, toxic-appearing or diaphoretic.  HENT:     Mouth/Throat:     Mouth: Mucous membranes are moist.     Pharynx: Oropharynx is clear.  Eyes:     General: No scleral icterus.    Extraocular Movements: Extraocular movements intact.     Pupils: Pupils are equal, round, and reactive to light.  Cardiovascular:     Rate and Rhythm: Normal rate and regular rhythm.     Pulses: Normal pulses.     Heart sounds: Normal heart sounds.  Pulmonary:     Effort: Pulmonary effort is normal. No respiratory distress.     Breath sounds: Normal breath sounds. No stridor. No wheezing, rhonchi or rales.  Chest:     Chest wall: No tenderness.  Abdominal:     General: Abdomen is flat. There is no distension.     Palpations: Abdomen is soft.     Tenderness: There is no abdominal tenderness. There is no guarding or rebound.  Genitourinary:    Comments: Chaperone present. Digital Rectal exam reveals sphincter with good tone.  Dark maroon blood noted in canal.  No external hemorrhoids, masses, or fissures.  Musculoskeletal:        General: No swelling or tenderness. Normal range of motion.     Cervical back: Normal range of motion and neck supple. No rigidity.     Right lower leg: No edema.     Left lower leg: No edema.  Skin:    General: Skin is warm and dry.     Capillary Refill: Capillary refill takes less than 2 seconds.     Coloration: Skin is not pale.  Neurological:     General: No focal deficit present.     Mental Status: She is alert and oriented to person, place, and time.  Psychiatric:        Mood  and Affect: Mood normal.        Behavior: Behavior  normal.    ED Results / Procedures / Treatments   Labs (all labs ordered are listed, but only abnormal results are displayed) Labs Reviewed  CBC WITH DIFFERENTIAL/PLATELET - Abnormal; Notable for the following components:      Result Value   RBC 2.71 (*)    Hemoglobin 7.2 (*)    HCT 23.9 (*)    RDW 16.5 (*)    All other components within normal limits  COMPREHENSIVE METABOLIC PANEL - Abnormal; Notable for the following components:   CO2 21 (*)    Glucose, Bld 170 (*)    BUN 55 (*)    Creatinine, Ser 6.51 (*)    Total Protein 5.7 (*)    Albumin 2.7 (*)    AST 9 (*)    GFR, Estimated 7 (*)    Anion gap 17 (*)    All other components within normal limits  POC OCCULT BLOOD, ED - Abnormal; Notable for the following components:   Fecal Occult Bld POSITIVE (*)    All other components within normal limits  RESP PANEL BY RT-PCR (FLU A&B, COVID) ARPGX2  CBC  TYPE AND SCREEN  PREPARE RBC (CROSSMATCH)    EKG None  Radiology No results found.  Procedures Procedures   Medications Ordered in ED Medications  0.9 %  sodium chloride infusion (Manually program via Guardrails IV Fluids) (has no administration in time range)  desmopressin (DDAVP) 20 mcg in sodium chloride 0.9 % 50 mL IVPB (has no administration in time range)  pantoprazole (PROTONIX) injection 40 mg (has no administration in time range)    ED Course  I have reviewed the triage vital signs and the nursing notes.  Pertinent labs & imaging results that were available during my care of the patient were reviewed by me and considered in my medical decision making (see chart for details).    MDM Rules/Calculators/A&P                         Patient presents to the emerge department today for recurrence of GI bleed.  Patient is hemodynamically stable, temperature 97.  Patient is symptomatic from GI bleed, blood pressure 104/54, patient feels dizzy and weak.  GU exam  with maroon blood on exam.  Hemoglobin dropped from 9-7.2.  Think would be reasonable for patient to be admitted at this time, did not get scope during last visit.   Appreciate GI and nephrology consult.  Spoke to Dr. Waldron Labs, who will admit patient.   The patient appears reasonably stabilized for admission considering the current resources, flow, and capabilities available in the ED at this time, and I doubt any other [facility] requiring further screening and/or treatment in the ED prior to admission.   Final Clinical Impression(s) / ED Diagnoses Final diagnoses:  Rectal bleeding    Rx / DC Orders ED Discharge Orders     None        Alfredia Client, PA-C 05/12/21 1825    Luna Fuse, MD 05/12/21 2201

## 2021-05-13 ENCOUNTER — Encounter (HOSPITAL_COMMUNITY): Payer: Self-pay | Admitting: Internal Medicine

## 2021-05-13 DIAGNOSIS — E1122 Type 2 diabetes mellitus with diabetic chronic kidney disease: Secondary | ICD-10-CM

## 2021-05-13 DIAGNOSIS — E039 Hypothyroidism, unspecified: Secondary | ICD-10-CM

## 2021-05-13 DIAGNOSIS — Z794 Long term (current) use of insulin: Secondary | ICD-10-CM

## 2021-05-13 DIAGNOSIS — K921 Melena: Secondary | ICD-10-CM

## 2021-05-13 DIAGNOSIS — E782 Mixed hyperlipidemia: Secondary | ICD-10-CM

## 2021-05-13 DIAGNOSIS — E1165 Type 2 diabetes mellitus with hyperglycemia: Secondary | ICD-10-CM

## 2021-05-13 LAB — CBC
HCT: 24.8 % — ABNORMAL LOW (ref 36.0–46.0)
HCT: 27.5 % — ABNORMAL LOW (ref 36.0–46.0)
Hemoglobin: 7.6 g/dL — ABNORMAL LOW (ref 12.0–15.0)
Hemoglobin: 8.5 g/dL — ABNORMAL LOW (ref 12.0–15.0)
MCH: 27 pg (ref 26.0–34.0)
MCH: 27.2 pg (ref 26.0–34.0)
MCHC: 30.6 g/dL (ref 30.0–36.0)
MCHC: 30.9 g/dL (ref 30.0–36.0)
MCV: 87.9 fL (ref 80.0–100.0)
MCV: 88.3 fL (ref 80.0–100.0)
Platelets: 278 10*3/uL (ref 150–400)
Platelets: 279 10*3/uL (ref 150–400)
RBC: 2.81 MIL/uL — ABNORMAL LOW (ref 3.87–5.11)
RBC: 3.13 MIL/uL — ABNORMAL LOW (ref 3.87–5.11)
RDW: 16 % — ABNORMAL HIGH (ref 11.5–15.5)
RDW: 16.6 % — ABNORMAL HIGH (ref 11.5–15.5)
WBC: 8.3 10*3/uL (ref 4.0–10.5)
WBC: 8.8 10*3/uL (ref 4.0–10.5)
nRBC: 0 % (ref 0.0–0.2)
nRBC: 0 % (ref 0.0–0.2)

## 2021-05-13 LAB — HEMOGLOBIN AND HEMATOCRIT, BLOOD
HCT: 23.5 % — ABNORMAL LOW (ref 36.0–46.0)
HCT: 23.7 % — ABNORMAL LOW (ref 36.0–46.0)
Hemoglobin: 7.2 g/dL — ABNORMAL LOW (ref 12.0–15.0)
Hemoglobin: 7.6 g/dL — ABNORMAL LOW (ref 12.0–15.0)

## 2021-05-13 LAB — COMPREHENSIVE METABOLIC PANEL
ALT: 9 U/L (ref 0–44)
AST: 10 U/L — ABNORMAL LOW (ref 15–41)
Albumin: 2.3 g/dL — ABNORMAL LOW (ref 3.5–5.0)
Alkaline Phosphatase: 94 U/L (ref 38–126)
Anion gap: 17 — ABNORMAL HIGH (ref 5–15)
BUN: 60 mg/dL — ABNORMAL HIGH (ref 6–20)
CO2: 17 mmol/L — ABNORMAL LOW (ref 22–32)
Calcium: 8.3 mg/dL — ABNORMAL LOW (ref 8.9–10.3)
Chloride: 100 mmol/L (ref 98–111)
Creatinine, Ser: 6.32 mg/dL — ABNORMAL HIGH (ref 0.44–1.00)
GFR, Estimated: 8 mL/min — ABNORMAL LOW (ref >60)
Glucose, Bld: 234 mg/dL — ABNORMAL HIGH (ref 70–99)
Potassium: 6.9 mmol/L (ref 3.5–5.1)
Sodium: 134 mmol/L — ABNORMAL LOW (ref 135–145)
Total Bilirubin: 0.8 mg/dL (ref 0.3–1.2)
Total Protein: 5 g/dL — ABNORMAL LOW (ref 6.5–8.1)

## 2021-05-13 LAB — GLUCOSE, CAPILLARY
Glucose-Capillary: 127 mg/dL — ABNORMAL HIGH (ref 70–99)
Glucose-Capillary: 131 mg/dL — ABNORMAL HIGH (ref 70–99)
Glucose-Capillary: 147 mg/dL — ABNORMAL HIGH (ref 70–99)
Glucose-Capillary: 240 mg/dL — ABNORMAL HIGH (ref 70–99)
Glucose-Capillary: 57 mg/dL — ABNORMAL LOW (ref 70–99)
Glucose-Capillary: 65 mg/dL — ABNORMAL LOW (ref 70–99)
Glucose-Capillary: 94 mg/dL (ref 70–99)

## 2021-05-13 LAB — PREPARE RBC (CROSSMATCH)

## 2021-05-13 MED ORDER — DEXTROSE 50 % IV SOLN
INTRAVENOUS | Status: AC
  Filled 2021-05-13: qty 50

## 2021-05-13 MED ORDER — LIDOCAINE HCL (PF) 1 % IJ SOLN: 5.0000 mL | INTRAMUSCULAR | Status: DC | PRN

## 2021-05-13 MED ORDER — CALCIUM GLUCONATE-NACL 1-0.675 GM/50ML-% IV SOLN
1.0000 g | Freq: Once | INTRAVENOUS | Status: DC
  Filled 2021-05-13: qty 50

## 2021-05-13 MED ORDER — PENTAFLUOROPROP-TETRAFLUOROETH EX AERO: 1.0000 "application " | INHALATION_SPRAY | CUTANEOUS | Status: DC | PRN

## 2021-05-13 MED ORDER — LIDOCAINE-PRILOCAINE 2.5-2.5 % EX CREA: 1.0000 "application " | TOPICAL_CREAM | CUTANEOUS | Status: DC | PRN

## 2021-05-13 MED ORDER — SODIUM CHLORIDE 0.9 % IV SOLN: 100.0000 mL | INTRAVENOUS | Status: DC | PRN

## 2021-05-13 MED ORDER — TORSEMIDE 20 MG PO TABS
150.0000 mg | ORAL_TABLET | Freq: Every day | ORAL | Status: DC
  Administered 2021-05-13 – 2021-05-15 (×3): 150 mg via ORAL
  Filled 2021-05-13 (×3): qty 8

## 2021-05-13 MED ORDER — PEG 3350-KCL-NA BICARB-NACL 420 G PO SOLR
4000.0000 mL | Freq: Once | ORAL | Status: AC
  Administered 2021-05-13: 4000 mL via ORAL

## 2021-05-13 MED ORDER — INSULIN GLARGINE 100 UNIT/ML ~~LOC~~ SOLN
15.0000 [IU] | Freq: Every day | SUBCUTANEOUS | Status: DC
  Filled 2021-05-13 (×4): qty 0.15

## 2021-05-13 MED ORDER — SODIUM CHLORIDE 0.9% IV SOLUTION: Freq: Once | INTRAVENOUS | Status: AC

## 2021-05-13 MED ORDER — ATORVASTATIN CALCIUM 10 MG PO TABS
10.0000 mg | ORAL_TABLET | Freq: Every day | ORAL | Status: DC
  Administered 2021-05-13 – 2021-05-15 (×2): 10 mg via ORAL
  Filled 2021-05-13 (×2): qty 1

## 2021-05-13 MED ORDER — SODIUM BICARBONATE 8.4 % IV SOLN: 50.0000 meq | Freq: Once | INTRAVENOUS | Status: DC

## 2021-05-13 MED ORDER — INSULIN GLARGINE 100 UNIT/ML ~~LOC~~ SOLN
30.0000 [IU] | Freq: Every day | SUBCUTANEOUS | Status: DC
  Administered 2021-05-13: 30 [IU] via SUBCUTANEOUS
  Filled 2021-05-13 (×2): qty 0.3

## 2021-05-13 MED ORDER — VALACYCLOVIR HCL 500 MG PO TABS
500.0000 mg | ORAL_TABLET | ORAL | Status: DC
  Administered 2021-05-13 – 2021-05-15 (×3): 500 mg via ORAL
  Filled 2021-05-13 (×3): qty 1

## 2021-05-13 MED ORDER — DAKINS (1/4 STRENGTH) 0.125 % EX SOLN
1.0000 "application " | Freq: Every day | CUTANEOUS | Status: DC
  Administered 2021-05-13 – 2021-05-15 (×3): 1
  Filled 2021-05-13: qty 473

## 2021-05-13 MED ORDER — SEVELAMER CARBONATE 800 MG PO TABS
800.0000 mg | ORAL_TABLET | Freq: Three times a day (TID) | ORAL | Status: DC
  Administered 2021-05-13 – 2021-05-15 (×4): 800 mg via ORAL
  Filled 2021-05-13 (×4): qty 1

## 2021-05-13 MED ORDER — SODIUM THIOSULFATE 250 MG/ML IV SOLN
25.0000 g | Freq: Once | INTRAVENOUS | Status: AC
  Administered 2021-05-13: 25 g via INTRAVENOUS
  Filled 2021-05-13: qty 100

## 2021-05-13 MED ORDER — LEVOTHYROXINE SODIUM 50 MCG PO TABS
50.0000 ug | ORAL_TABLET | Freq: Every day | ORAL | Status: DC
  Administered 2021-05-13 – 2021-05-15 (×3): 50 ug via ORAL
  Filled 2021-05-13 (×3): qty 1

## 2021-05-13 MED ORDER — GLUCOSE 40 % PO GEL
1.0000 | ORAL | Status: AC
  Administered 2021-05-13: 31 g via ORAL
  Filled 2021-05-13: qty 1

## 2021-05-13 NOTE — Consult Note (Addendum)
[facility] Renal Consultation Note  Requesting MD: Irwin Brakeman, MD Indication for Consultation:  ESRD  Chief complaint: rectal bleeding  HPI:  Amy Moses is a 50 y.o. female with a history of ESRD on hemodialysis TTS at [name], DM, HTN, and hypothyroidism who presented to the hospital with rectal bleeding.  She also complained of feeling weak and dizzy and had some nausea.  Nephrology is consulted for assistance with management of end-stage renal disease on hemodialysis.  Note that she has been treated for calciphylaxis and is followed in the [facility]  She has recently had left chest shingles as well per charting.  She states this has just been going on a few days.  She hasn't had HD since Saturday - was going to go yesterday to make up then this started and she went to the ER; didn't think she could sit through treatment.  She states has had calciphylasix since august 2021.    PMHx:   Past Medical History:  Diagnosis Date   Anemia    Blindness of right eye with low vision in contralateral eye    s/p victrectomy   Diabetes mellitus, type II ([place])    Dyslipidemia    Glaucoma    Hypertension    Hypothyroidism (acquired)    Kidney disease    Stage 5   Pneumonia     Past Surgical History:  Procedure Laterality Date   ABDOMINAL AORTOGRAM W/LOWER EXTREMITY Bilateral 12/18/2020   Procedure: ABDOMINAL AORTOGRAM W/LOWER EXTREMITY;  Surgeon: Elam Dutch, MD;  Location: [place] CV LAB;  Service: Cardiovascular;  Laterality: Bilateral;   ANKLE FRACTURE SURGERY     AV FISTULA PLACEMENT Left 08/18/2020   Procedure: LEFT ARM BRACHIOCEPHALIC ARTERIOVENOUS (AV) FISTULA CREATION;  Surgeon: Elam Dutch, MD;  Location: [place];  Service: Vascular;  Laterality: Left;   BIOPSY  04/24/2021   Procedure: BIOPSY;  Surgeon: Eloise Harman, DO;  [facility];  Service: Endoscopy;;   CESAREAN SECTION     CHOLECYSTECTOMY     COLONOSCOPY   04/24/2021   Surgeon: Eloise Harman, DO;  nonbleeding internal hemorrhoids, 1 large (25 mm) pedunculated transverse colon polyp (prolapse type polyp) with adherent clot and stigmata of recent bleed.   ESOPHAGOGASTRODUODENOSCOPY (EGD) WITH PROPOFOL N/A 04/24/2021   Surgeon: Eloise Harman, DO;  duodenal erosions and gastritis biopsied (pathology with peptic duodenitis, reactive gastropathy with erosions/chronic inflammation, negative for H. pylori)   EYE SURGERY     Vatrectomy   IR PERC TUN PERIT CATH WO PORT S&I /IMAG  09/15/2020   IR REMOVAL TUN CV CATH W/O FL  02/19/2021   IR US GUIDE VASC ACCESS RIGHT  09/15/2020   POLYPECTOMY  04/24/2021   Procedure: POLYPECTOMY;  Surgeon: Eloise Harman, DO;  [facility];  Service: Endoscopy;;   TOE SURGERY      Family Hx:  Family History  Problem Relation Age of Onset   Heart disease Mother    Diabetes Mother    Kidney disease Mother    Diabetes Father    Heart disease Father    Diabetes Brother    Colon cancer Neg Hx     Social History:  reports that she has never smoked. She has never used smokeless tobacco. She reports that she does not drink alcohol and does not use drugs.  Allergies:  Allergies  Allergen Reactions   Ace Inhibitors Cough    Medications: Prior to Admission medications  Medication Sig Start Date End Date Taking? Authorizing Provider  acetaminophen (TYLENOL) 500 MG tablet Take 1,000 mg by mouth every 6 (six) hours as needed for moderate pain.   Yes [provider]  aspirin EC 81 MG tablet Take 1 tablet (81 mg total) by mouth daily with breakfast. 04/25/21 04/25/22 Yes Emokpae, Courage, MD  atorvastatin (LIPITOR) 10 MG tablet Take 1 tablet (10 mg total) by mouth daily. 04/25/21  Yes Emokpae, Courage, MD  HUMALOG KWIKPEN 100 UNIT/ML KwikPen Inject 5-15 Units into the skin 3 (three) times daily with meals. 03/03/20  Yes [provider]  hydrocortisone (ANUSOL-HC) 2.5 % rectal cream Place  1 application rectally 2 (two) times daily for 7 days. 05/11/21 05/18/21 Yes Barton Dubois, MD  insulin glargine (LANTUS) 100 UNIT/ML injection Inject 50 Units into the skin at bedtime.   Yes [provider]  levothyroxine (SYNTHROID) 50 MCG tablet Take 1 tablet (50 mcg total) by mouth daily before breakfast. 04/25/21  Yes Emokpae, Courage, MD  metoprolol succinate (TOPROL-XL) 50 MG 24 hr tablet Take 1 tablet (50 mg total) by mouth at bedtime. Take with or immediately following a meal. 04/25/21  Yes Emokpae, Courage, MD  pantoprazole (PROTONIX) 40 MG tablet Take 1 tablet (40 mg total) by mouth 2 (two) times daily. 04/25/21 04/25/22 Yes Emokpae, Courage, MD  polyethylene glycol (MIRALAX) 17 g packet Take 17 g by mouth daily as needed for mild constipation. 05/11/21  Yes Barton Dubois, MD  sevelamer carbonate (RENVELA) 800 MG tablet Take 1 tablet (800 mg total) by mouth 3 (three) times daily with meals. 04/25/21  Yes Emokpae, Courage, MD  torsemide (DEMADEX) 100 MG tablet Take 150 mg by mouth daily.  05/22/20  Yes [provider]  valACYclovir (VALTREX) 500 MG tablet Take 1 tablet (500 mg total) by mouth daily for 7 days. 05/12/21 05/19/21 Yes Barton Dubois, MD  Vitamin D, Ergocalciferol, (DRISDOL) 1.25 MG (50000 UNIT) CAPS capsule Take 1 capsule by mouth every 14 (fourteen) days. 04/20/21  Yes [provider]    I have reviewed the patient's current and reported prior to admission medications.  Labs:  BMP Latest Ref Rng & Units 05/13/2021 05/12/2021 05/11/2021  Glucose 70 - 99 mg/dL 234(H) 170(H) 191(H)  BUN 6 - 20 mg/dL 60(H) 55(H) 45(H)  Creatinine 0.44 - 1.00 mg/dL 6.32(H) 6.51(H) 5.37(H)  Sodium 135 - 145 mmol/L 134(L) 138 137  Potassium 3.5 - 5.1 mmol/L 6.9(HH) 5.1 4.3  Chloride 98 - 111 mmol/L 100 100 97(L)  CO2 22 - 32 mmol/L 17(L) 21(L) 25  Calcium 8.9 - 10.3 mg/dL 8.3(L) 9.2 9.3    ROS:  Pertinent items noted in HPI and remainder of comprehensive ROS otherwise negative.    Physical Exam: Vitals:   05/13/21 0019 05/13/21 0607  BP: 110/67 104/81  Pulse: 78 72  Resp: (!) 22 16  Temp: 97.8 F (36.6 C) (!) 97.3 F (36.3 C)  SpO2: 100% 98%     General: adult female in bed in NAD HEENT: NCAT Eyes: EOMI sclera anicteric Neck: supple trachea midline Heart: S1S2 no rub Lungs: clear and unlabored on room air Abdomen: soft/NT/ND Extremities: no pitting edema Skin: no rash on extremities exposed Neuro: alert and oriented x 3 provides hx and follows commands Psych normal mood and affect Access LUE AVF bruit and thrill   Assessment/Plan:  # hyperkalemia - HD now - 0K bath for 30 min then transition to 2K bath - Spoke with HD RN  - calcium once now  #  ESRD - HD per TTS schedule   # GI bleed  - per Primary and GI  # HTN  - Controlled  # Anemia - CKD and acute blood loss  - transfusion per primary team - will get outpatient records  # Metabolic bone disease  - will get outpatient records  # Reported shingles outbreak  - per primary team   # Calciphylaxis  - will get outpatient records and anticipate sodium thiosulfate  Claudia Desanctis 05/13/2021, 10:31 AM   Everrett Coombe [phone]  EDW 87.5 kg TTS BF 400 DF 800 AVF LUE 2K/2.5 Ca Meds - sodium thiosulfate 25 gram every tx; venofer 50 mg weekly ESA: epogen 20,000 units each tx Vit D: not on calcitriol or hectorol ; not on sensipar  Last post weight was 87.5 kg on 6/18   Claudia Desanctis, MD 12:36 PM 05/13/2021

## 2021-05-13 NOTE — Progress Notes (Signed)
Date and time results received: 05/13/21 0655 (use smartphrase ".now" to insert current time)  Test: K+  Critical Value: 6.9  Name of Provider Notified: Zierle & Wynetta Emery  Orders Received? Or Actions Taken?: awaiting response

## 2021-05-13 NOTE — Plan of Care (Signed)
[name]  EDW 87.5 kg TTS BF 400 DF 800 AVF LUE 2K/2.5 Ca Meds - sodium thiosulfate 25 gram every tx; venofer 50 mg weekly ESA: epogen 20,000 units each tx Vit D: not on calcitriol or hectorol ; not on sensipar  Last post weight was 87.5 kg on 6/18   Claudia Desanctis, MD 12:36 PM 05/13/2021

## 2021-05-13 NOTE — Procedures (Signed)
    HEMODIALYSIS TREATMENT NOTE:  4 hour heparin-free HD completed via left upper arm AVF (15g/antegrade).  Goal NOT met:  Unable to tolerate UF d/t symptomatic hypotension relieved with two separate NS boluses--d/w Dr. Royce Macadamia.  One unit pRBC was transfused.  Sodium thiosulfate also given.  Net +310  cc.  No changes from pre-HD assessment.  Rockwell Alexandria, RN

## 2021-05-13 NOTE — Consult Note (Signed)
[facility] Nurse wound consult note Consultation was completed by review of records, images and assistance from the bedside nurse/clinical staff.   Reason for Consult: calciphylaxis wounds Patient is followed in the [name]; I have reviewed the POC established there and updated wound care orders accordingly. HHRN from [facility] is currently providing topical care.   Wound type: calciphylaxis and pressure Pressure Injury POA: Yes Measurement: per [facility] notes 05/03/21 Right upper leg; 6.2cm x 15cm x 0.4cm  Left lateral calf: 2.2cm x 1.4cm x 0.4cm  Right distal posterior leg: 6.8cm x 4.5cm x 0.1cm  Left lateral calcaneous: 2cm x 3cm x 0.1cm  Left medial upper leg: 10.3cm x 2cm x 0.2cm  Left lateral upper leg: 1.4cm x 7.3cm x 0.4cm  Right calcaneous: 3.7cm x 3.4cm x 0.5cm  Left lateral calf Drainage (amount, consistency, odor) see nursing flowsheet  Periwound: LE edema consistently  Dressing procedure/placement/frequency:   Dakin's moist gauze to the right heel, right and left leg wounds EXCEPT right medial upper leg and left medial upper leg.  Paint left lateral calcaneous wound with betadine swab daily. Allow to air dry, cover with dry dressing  Silver hydrofiber (Aquacel Ag+) to the right upper medial leg and left upper medial legs, top with dry dressings. Secure with kerlix. Change EVERY OTHER DAY  Resume HHRN at [place] for topical wound care.  Follow up in the [facility] as scheduled.    Re consult if needed, will not follow at this time. Thanks  Timothy Townsel [employer], RN,CWOCN, CNS, [place] [phone])

## 2021-05-13 NOTE — Progress Notes (Signed)
Hypoglycemic Event  CBG:57  Treatment:  1 tube glucose gel   Symptoms: none   Follow-up CBG: Time  CBG Result 97  Possible Reasons for Event:  receiving dialysis   Comments/MD notified:  New orders placed by Dr Thurnell Garbe, Lester Pony

## 2021-05-13 NOTE — Consult Note (Addendum)
$'@LOGO'n$ @   Referring Provider: [facility] hospitalist Primary Care Physician:  Leeanne Rio, MD Primary Gastroenterologist:  Dr. Abbey Chatters  Date of Admission: 05/12/21 Date of Consultation: 05/13/21  Reason for Consultation: Rectal bleeding  HPI:  Amy Moses is a 50 y.o. year old female with medical history significant for anemia, ESRD on dialysis, blind right eye, diabetes, HTN, HLD, hypothyroidism, lower GI bleed in early June, and recently diagnosed with shingles who presented to the emergency room due to rectal bleeding.  Recent EGD and colonoscopy 04/24/2021 during hospitalization for rectal bleeding/acute GI bleed.  EGD with duodenal erosions and gastritis biopsied (pathology with peptic duodenitis, reactive gastropathy with erosions/chronic inflammation, negative for H. pylori) colonoscopy with nonbleeding internal hemorrhoids, 1 large (25 mm) pedunculated transverse colon polyp (prolapse type polyp) with adherent clot and stigmata of recent bleed which was resected with hot snare and suspected likely source of patient's acute GI bleed.  She returned to the emergency room on 6/20 due to couple episodes of rectal bleeding in the setting of constipation and was admitted overnight.  She had stable hemoglobin, no further bleeding and was discharged on 6/21.  Notably, she was also diagnosed with shingles at that time.  She presented again to the emergency room yesterday due to significant bright red blood per rectum with associated dizziness, lightheadedness, and some shortness of breath.  She tells me that after her colonoscopy, rectal bleeding stopped for about 4 days, but then returned.  She said intermittent rectal bleeding since that time, but over the last 4 to 5 days, rectal bleeding has significantly increased in severity.  States her stools are " straight blood".  Blood is dark red with bright red blood on toilet tissue.  Last episode of rectal bleeding was yesterday evening.  No BM  today.  Denies abdominal pain, melanotic stool, nausea, vomiting, reflux symptoms, dysphagia.  She has been taking Protonix 40 mg twice daily since her last hospital discharge.  Denies NSAIDs.  Denies alcohol.  Regarding her recent diagnosis of shingles, she is on Valtrex and is feeling improved.  Denies pain at this time.  Most all of her lesions are scabbed over.   Unfortunately, she has missed dialysis.  States she was supposed to go on Wednesday and Thursday this week, but she returned to the emergency room.  In the ED, she was hemodynamically stable.  Found to have hemoglobin of 7.2, down from 9.5, 2 days prior.  Creatinine elevated at 6.51 with BUN 55.  Fecal occult blood positive.  She received 1 unit PRBCs with posttransfusion hemoglobin 8.5, back down 7.6 this morning.  Bleeding scan completed yesterday with no evidence of active GI hemorrhage.  Past Medical History:  Diagnosis Date   Anemia    Blindness of right eye with low vision in contralateral eye    s/p victrectomy   Diabetes mellitus, type II ([place])    Dyslipidemia    Glaucoma    Hypertension    Hypothyroidism (acquired)    Kidney disease    Stage 5   Pneumonia     Past Surgical History:  Procedure Laterality Date   ABDOMINAL AORTOGRAM W/LOWER EXTREMITY Bilateral 12/18/2020   Procedure: ABDOMINAL AORTOGRAM W/LOWER EXTREMITY;  Surgeon: Elam Dutch, MD;  Location: [facility];  Service: Cardiovascular;  Laterality: Bilateral;   ANKLE FRACTURE SURGERY     AV FISTULA PLACEMENT Left 08/18/2020   Procedure: LEFT ARM BRACHIOCEPHALIC ARTERIOVENOUS (AV) FISTULA CREATION;  Surgeon: Elam Dutch, MD;  Location: [facility];  Service: Vascular;  Laterality: Left;   BIOPSY  04/24/2021   Procedure: BIOPSY;  Surgeon: Eloise Harman, DO;  [facility];  Service: Endoscopy;;   CESAREAN SECTION     CHOLECYSTECTOMY     COLONOSCOPY  04/24/2021   Procedure: COLONOSCOPY;  Surgeon: Eloise Harman, DO;   [facility];  Service: Endoscopy;;   ESOPHAGOGASTRODUODENOSCOPY (EGD) WITH PROPOFOL N/A 04/24/2021   Procedure: ESOPHAGOGASTRODUODENOSCOPY (EGD) WITH PROPOFOL;  Surgeon: Eloise Harman, DO;  [facility];  Service: Endoscopy;  Laterality: N/A;   EYE SURGERY     Vatrectomy   IR PERC TUN PERIT CATH WO PORT S&I /IMAG  09/15/2020   IR REMOVAL TUN CV CATH W/O FL  02/19/2021   IR US GUIDE VASC ACCESS RIGHT  09/15/2020   POLYPECTOMY  04/24/2021   Procedure: POLYPECTOMY;  Surgeon: Eloise Harman, DO;  [facility];  Service: Endoscopy;;   TOE SURGERY      Prior to Admission medications   Medication Sig Start Date End Date Taking? Authorizing Provider  acetaminophen (TYLENOL) 500 MG tablet Take 1,000 mg by mouth every 6 (six) hours as needed for moderate pain.   Yes [provider]  aspirin EC 81 MG tablet Take 1 tablet (81 mg total) by mouth daily with breakfast. 04/25/21 04/25/22 Yes Emokpae, Courage, MD  atorvastatin (LIPITOR) 10 MG tablet Take 1 tablet (10 mg total) by mouth daily. 04/25/21  Yes Emokpae, Courage, MD  HUMALOG KWIKPEN 100 UNIT/ML KwikPen Inject 5-15 Units into the skin 3 (three) times daily with meals. 03/03/20  Yes [provider]  hydrocortisone (ANUSOL-HC) 2.5 % rectal cream Place 1 application rectally 2 (two) times daily for 7 days. 05/11/21 05/18/21 Yes Barton Dubois, MD  insulin glargine (LANTUS) 100 UNIT/ML injection Inject 50 Units into the skin at bedtime.   Yes [provider]  levothyroxine (SYNTHROID) 50 MCG tablet Take 1 tablet (50 mcg total) by mouth daily before breakfast. 04/25/21  Yes Emokpae, Courage, MD  metoprolol succinate (TOPROL-XL) 50 MG 24 hr tablet Take 1 tablet (50 mg total) by mouth at bedtime. Take with or immediately following a meal. 04/25/21  Yes Emokpae, Courage, MD  pantoprazole (PROTONIX) 40 MG tablet Take 1 tablet (40 mg total) by mouth 2 (two) times daily. 04/25/21 04/25/22 Yes Emokpae, Courage, MD   polyethylene glycol (MIRALAX) 17 g packet Take 17 g by mouth daily as needed for mild constipation. 05/11/21  Yes Barton Dubois, MD  sevelamer carbonate (RENVELA) 800 MG tablet Take 1 tablet (800 mg total) by mouth 3 (three) times daily with meals. 04/25/21  Yes Emokpae, Courage, MD  torsemide (DEMADEX) 100 MG tablet Take 150 mg by mouth daily.  05/22/20  Yes [provider]  valACYclovir (VALTREX) 500 MG tablet Take 1 tablet (500 mg total) by mouth daily for 7 days. 05/12/21 05/19/21 Yes Barton Dubois, MD  Vitamin D, Ergocalciferol, (DRISDOL) 1.25 MG (50000 UNIT) CAPS capsule Take 1 capsule by mouth every 14 (fourteen) days. 04/20/21  Yes [provider]    Current Facility-Administered Medications  Medication Dose Route Frequency Provider Last Rate Last Admin   atorvastatin (LIPITOR) tablet 10 mg  10 mg Oral Daily Elgergawy, Silver Huguenin, MD       Chlorhexidine Gluconate Cloth 2 % PADS 6 each  6 each Topical Q0600 Reesa Chew, MD       desmopressin (DDAVP) 20 mcg in sodium chloride 0.9 % 50 mL IVPB  20 mcg Intravenous Once Elgergawy, Silver Huguenin, MD       heparin lock flush 100 UNIT/ML injection            insulin aspart (novoLOG) injection 0-5 Units  0-5 Units Subcutaneous QHS Elgergawy, Silver Huguenin, MD   2 Units at 05/13/21 0115   insulin aspart (novoLOG) injection 0-9 Units  0-9 Units Subcutaneous TID WC Elgergawy, Silver Huguenin, MD       insulin glargine (LANTUS) injection 30 Units  30 Units Subcutaneous QHS Elgergawy, Silver Huguenin, MD   30 Units at 05/13/21 0114   levothyroxine (SYNTHROID) tablet 50 mcg  50 mcg Oral QAC breakfast Elgergawy, Silver Huguenin, MD   50 mcg at 05/13/21 [id number]   metoprolol succinate (TOPROL-XL) 24 hr tablet 50 mg  50 mg Oral QHS Elgergawy, Silver Huguenin, MD       pantoprazole (PROTONIX) injection 40 mg  40 mg Intravenous Q12H Elgergawy, Silver Huguenin, MD   40 mg at 05/13/21 0114   sevelamer carbonate (RENVELA) tablet 800 mg  800 mg Oral TID WC Elgergawy, Silver Huguenin, MD        torsemide (DEMADEX) tablet 150 mg  150 mg Oral Daily Elgergawy, Silver Huguenin, MD       valACYclovir (VALTREX) tablet 500 mg  500 mg Oral Q24H Elgergawy, Silver Huguenin, MD   500 mg at 05/13/21 0114    Allergies as of 05/12/2021 - Review Complete 05/12/2021  Allergen Reaction Noted   Ace inhibitors Cough 11/19/2020    Family History  Problem Relation Age of Onset   Heart disease Mother    Diabetes Mother    Kidney disease Mother    Diabetes Father    Heart disease Father    Diabetes Brother    Colon cancer Neg Hx     Social History   Socioeconomic History   Marital status: Single    Spouse name: Not on file   Number of children: Not on file   Years of education: Not on file   Highest education level: Not on file  Occupational History   Not on file  Tobacco Use   Smoking status: Never   Smokeless tobacco: Never  Vaping Use   Vaping Use: Never used  Substance and Sexual Activity   Alcohol use: No   Drug use: No   Sexual activity: Yes    Birth control/protection: Condom  Other Topics Concern   Not on file  Social History Narrative   Not on file   Social Determinants of Health   Financial Resource Strain: Not on file  Food Insecurity: Not on file  Transportation Needs: Not on file  Physical Activity: Not on file  Stress: Not on file  Social Connections: Not on file  Intimate Partner Violence: Not on file    Review of Systems: Gen: Denies fever, chills. CV: Denies chest pain, heart palpitations Resp: Denies shortness of breath or cough GI: See HPI MS: Left knee pain. Derm: Positive shingles rash on posterior neck and left upper chest/shoulder Psych: Denies depression, anxiety Heme: See HPI  Physical Exam: Vital signs in last 24 hours: Temp:  [95.6 F (35.3 C)-97.8 F (36.6 C)] 97.3 F (36.3 C) (06/23 0607) Pulse Rate:  [64-78] 72 (06/23 0607) Resp:  [14-25] 16 (06/23 0607) BP: (104-125)/(48-82) 104/81 (06/23 0607) SpO2:  [91 %-100 %] 98 % (06/23  0607) Weight:  [80.4 kg] 80.4 kg (06/22 1512) Last BM Date: 05/13/21 General:   Alert,  Well-developed, well-nourished, pleasant and cooperative in  NAD Head:  Normocephalic and atraumatic. Eyes:  Sclera clear, no icterus.   Conjunctiva pink. Ears:  Normal auditory acuity. Lungs:  Clear throughout to auscultation.   No wheezes, crackles, or rhonchi. No acute distress. Heart:  Regular rate and rhythm; no murmurs, clicks, rubs,  or gallops. Abdomen:  Soft, nontender and nondistended. No masses, hepatosplenomegaly or hernias noted. Normal bowel sounds, without guarding, and without rebound.   Rectal:  Deferred until time of colonoscopy.   Msk:  Symmetrical without gross deformities. Normal posture. Extremities:  Without edema.  Dressings in place on inner thighs bilaterally and below the knee bilaterally. Neurologic:  Alert and  oriented x4;  grossly normal neurologically. Skin: Shingles lesions with scabs on posterior neck, left shoulder and upper chest area. Psych: Normal mood and affect.  Intake/Output from previous day: 06/22 0701 - 06/23 0700 In: 315 [Blood:315] Out: -  Intake/Output this shift: No intake/output data recorded.  Lab Results: Recent Labs    05/11/21 0512 05/11/21 0923 05/11/21 1647 05/12/21 1527 05/12/21 2353  WBC 8.9  --   --  8.4 8.3  HGB 8.7*   < > 9.5* 7.2* 8.5*  HCT 29.0*   < > 31.5* 23.9* 27.5*  PLT 296  --   --  328 279   < > = values in this interval not displayed.   BMET Recent Labs    05/11/21 0512 05/12/21 1527 05/13/21 0455  NA 137 138 134*  K 4.3 5.1 6.9*  CL 97* 100 100  CO2 25 21* 17*  GLUCOSE 191* 170* 234*  BUN 45* 55* 60*  CREATININE 5.37* 6.51* 6.32*  CALCIUM 9.3 9.2 8.3*   LFT Recent Labs    05/11/21 0512 05/12/21 1527 05/13/21 0455  PROT 5.6* 5.7* 5.0*  ALBUMIN 2.6* 2.7* 2.3*  AST 8* 9* 10*  ALT '8 8 9  '$ ALKPHOS 111 108 94  BILITOT 0.7 0.4 0.8   Studies/Results: NM GI Blood Loss  Result Date: 05/13/2021 CLINICAL  DATA:  Hematochezia EXAM: NUCLEAR MEDICINE GASTROINTESTINAL BLEEDING SCAN TECHNIQUE: Sequential abdominal images were obtained following intravenous administration of Tc-5mlabeled red blood cells. RADIOPHARMACEUTICALS:  24.5 mCi Tc-920mertechnetate in-vitro labeled red cells. COMPARISON:  None. FINDINGS: Normal distribution of radiotracer within the blood pool, liver, and spleen. No evidence of active gastrointestinal hemorrhage. IMPRESSION: No evidence of active gastrointestinal hemorrhage. Electronically Signed   By: AsFidela SalisburyD   On: 05/13/2021 00:03    Impression: [age].o. year old female with medical history significant for anemia, ESRD on dialysis, blind right eye, diabetes, HTN, HLD, hypothyroidism, lower GI bleed in early June, and recently diagnosed with shingles (6/20) who presented to the emergency room due to rectal bleeding and found to have decline in hemoglobin to 7.2 from 9.5, 2 days ago.  Acute on chronic anemia/GI bleed: Previously admitted in early June for rectal bleeding with EGD and colonoscopy completed 6/4. EGD with duodenal erosions and gastritis biopsied (pathology with peptic duodenitis, reactive gastropathy with erosions/chronic inflammation, negative for H. pylori) colonoscopy with nonbleeding internal hemorrhoids, 1 large (25 mm) pedunculated transverse colon polyp (prolapse type polyp) with adherent clot and stigmata of recent bleed which was resected with hot snare and suspected likely source of patient's acute GI bleed.  She reports having intermittent rectal bleeding starting 4 days after colonoscopy, but has increased in severity over the last 4 to 5 days with dark red and bright red blood. Denies NSAIDs and had been talking PPI BID at home. Hemoglobin 7.2  on admission, down from 9.5, 2 days ago. Received 1 unit PRBCs with hemoglobin up to 8.5 this morning.  Though we are about 3 weeks out from her colonoscopy, query whether she is experiencing a post polypectomy  bleed.  No diverticula noted on colonoscopy. Would recommend repeat colonoscopy for further evaluation. However, she has significantly elevated potassium in the setting of missing dialysis x2. This will need to be corrected prior to colonoscopy. Patient reports she is to be dialyzed today.   Shingles: On Valtrex.  Improving.  Most of her lesions are scabbed over.  Plan: 1.  Continue to follow H&H and for overt GI bleeding.  Transfuse as needed. 2.  Timing of colonoscopy to be determined in the setting of hyperkalemia.  Anticipate dialysis today, possibly colonoscopy tomorrow.  Will discuss with Dr. Gala Romney. 3.  Clear liquid diet today.  4.  Continue PPI twice daily.  5.  Management of hyperkalemia and shingles per hospitalist.    LOS: 1 day    05/13/2021, 7:55 AM   Addendum: Spoke with Dr. Gala Romney.  We will plan for colonoscopy with propofol with Dr. Gala Romney tomorrow.The risks, benefits, and alternatives have been discussed with the patient in detail. The patient states understanding and desires to proceed.  Will need to monitor H/H closely, transfuse as needed, and recheck electrolytes in the morning.    Aliene Altes, PA-C [facility]

## 2021-05-13 NOTE — Progress Notes (Signed)
Patients CBG 65, glucose was taken  by NT while patient receiving dialysis will reassess when treatment is complete.

## 2021-05-13 NOTE — Progress Notes (Signed)
PROGRESS NOTE   Amy Moses  [id number] DOB: [birth year] DOA: 05/12/2021 PCP: Leeanne Rio, MD   Chief Complaint  Patient presents with   Rectal Bleeding   Level of care: Med-Surg  Brief Admission History:  50 y.o. female, with medical history significant of Anemia, Blind R eye, DM, HLD, HTN, hypothyroidism, ESRD on dialysis. Dialysis T/TH/Sat, patient was just discharged yesterday due to lower GI bleed, patient with recent hospitalization 6/3 for GI bleed, s/p endoscopy/colonoscopy, significant for gastritis, duodenal erosions and polyp with bleeding stigmata, status post polypectomy, patient was readmitted on 6/20 for  another episode of bright red blood per rectum, but this has resolved without intervention, and her hemoglobin has been stable, she so she was discharged 6/21, patient reports significant amount of bright red blood per rectum overnight, as well she does report dizziness, lightheadedness, and some shortness of breath, which prompted her to come to the ED, of note last hemodialysis was on Saturday, and patient missed her hemodialysis today, but reports she is making urine, she denies any abdominal pain, loose stools, chest pain, palpitation. -In ED her hemoglobin was noted to be low at 7.2, down from 9.5 upon discharge yesterday afternoon, she had Hemoccult positive stool, she had significant hematochezia in the commode, her potassium was 5.1, BUN was 55, [facility] hospitalist consulted to admit.   Assessment & Plan:   Active Problems:   Uncontrolled type 2 diabetes mellitus with chronic kidney disease, with long-term current use of insulin (HCC)   Hyperlipidemia   Primary hypothyroidism   Calciphylaxis   Acute blood loss anemia   Blindness   Shingles   Lower GI bleed   Recurrent lower GI bleeding - acute blood loss anemia. S/p 1 unit PRBC. Appreciate GI consult with plans for repeat colonoscopy.  Follow CBC.  Clear liquid diet.    ESRD on HD - nephrology team  consulted and planning for HD treatment today.  Follow renal function panel.   Calciphylaxis - Pt is followed by [facility] in [facility].  Wound care RN has evaluated and orders placed.    Shingles Zoster - Pt is completing a 7 day course of valtrex 500 mg daily.  Lesions are healing and now pruritic (Pt advised not to scratch).   Blindness - assist with feeding order placed.   Hyperlipidemia - resume home statin therapy.    Hypothyroidism - stable on  home regimen  DVT prophylaxis: SCD Code Status: Full  Family Communication: plan of care discussed with patient who verbalized understanding  Disposition: home  Status is: Inpatient  Remains inpatient appropriate because:IV treatments appropriate due to intensity of illness or inability to take PO and Inpatient level of care appropriate due to severity of illness  Dispo: The patient is from: Home              Anticipated d/c is to: Home              Patient currently is not medically stable to d/c.   Difficult to place patient No   Consultants:  Nephrology GI   Procedures:  hemodialysis  Antimicrobials:  Valtrex  6/21>>  Subjective: Pt reports shingles rash is itching.    Objective: Vitals:   05/12/21 2000 05/12/21 2132 05/13/21 0019 05/13/21 0607  BP: 125/64 119/66 110/67 104/81  Pulse: 76 78 78 72  Resp: 16 17 (!) 22 16  Temp:  97.7 F (36.5 C) 97.8 F (36.6 C) (!) 97.3 F (36.3 C)  TempSrc:  Oral Oral  SpO2: 96% 98% 100% 98%  Weight:      Height:        Intake/Output Summary (Last 24 hours) at 05/13/2021 1314 Last data filed at 05/13/2021 1301 Gross per 24 hour  Intake 700 ml  Output --  Net 700 ml   Filed Weights   05/12/21 1512  Weight: 80.4 kg    Examination:  General exam: chronically ill appearing female, Appears calm and comfortable  Respiratory system: Clear to auscultation. Respiratory effort normal. Cardiovascular system: normal S1 & S2 heard. No JVD, murmurs, rubs, gallops or clicks. No pedal  edema. Gastrointestinal system: Abdomen is nondistended, soft and nontender. No organomegaly or masses felt. Normal bowel sounds heard. Central nervous system: Alert and oriented. No focal neurological deficits. Extremities: Symmetric 5 x 5 power. Skin: left chest wall with healing crusted herpetic lesions c/w healing shingles infection.  Psychiatry: Judgement and insight appear normal. Mood & affect appropriate.   Data Reviewed: I have personally reviewed following labs and imaging studies  CBC: Recent Labs  Lab 05/10/21 1607 05/11/21 0135 05/11/21 0512 05/11/21 0923 05/11/21 1647 05/12/21 1527 05/12/21 2353 05/13/21 0814 05/13/21 1219  WBC 7.3  --  8.9  --   --  8.4 8.3 8.8  --   NEUTROABS 5.2  --   --   --   --  5.9  --   --   --   HGB 10.1*   < > 8.7*   < > 9.5* 7.2* 8.5* 7.6* 7.2*  HCT 33.3*   < > 29.0*   < > 31.5* 23.9* 27.5* 24.8* 23.5*  MCV 87.9  --  87.3  --   --  88.2 87.9 88.3  --   PLT 318  --  296  --   --  328 279 278  --    < > = values in this interval not displayed.    Basic Metabolic Panel: Recent Labs  Lab 05/10/21 1607 05/11/21 0512 05/12/21 1527 05/13/21 0455  NA 137 137 138 134*  K 4.7 4.3 5.1 6.9*  CL 93* 97* 100 100  CO2 26 25 21* 17*  GLUCOSE 267* 191* 170* 234*  BUN 40* 45* 55* 60*  CREATININE 5.16* 5.37* 6.51* 6.32*  CALCIUM 9.6 9.3 9.2 8.3*  MG  --  2.8*  --   --   PHOS  --  5.6*  --   --     GFR: Estimated Creatinine Clearance: 11.8 mL/min (A) (by C-G formula based on SCr of 6.32 mg/dL (H)).  Liver Function Tests: Recent Labs  Lab 05/11/21 0512 05/12/21 1527 05/13/21 0455  AST 8* 9* 10*  ALT '8 8 9  '$ ALKPHOS 111 108 94  BILITOT 0.7 0.4 0.8  PROT 5.6* 5.7* 5.0*  ALBUMIN 2.6* 2.7* 2.3*    CBG: Recent Labs  Lab 05/11/21 1125 05/11/21 1621 05/13/21 0041 05/13/21 0747 05/13/21 1114  GLUCAP 76 108* 240* 147* 131*    Recent Results (from the past 240 hour(s))  Resp Panel by RT-PCR (Flu A&B, Covid) Nasopharyngeal Swab      Status: None   Collection Time: 05/10/21  7:49 PM   Specimen: Nasopharyngeal Swab; Nasopharyngeal(NP) swabs in vial transport medium  Result Value Ref Range Status   SARS Coronavirus 2 by RT PCR NEGATIVE NEGATIVE Final    Comment: (NOTE) SARS-CoV-2 target nucleic acids are NOT DETECTED.  The SARS-CoV-2 RNA is generally detectable in upper respiratory specimens during the acute phase of infection. The lowest concentration of SARS-CoV-2  viral copies this assay can detect is 138 copies/mL. A negative result does not preclude SARS-Cov-2 infection and should not be used as the sole basis for treatment or other patient management decisions. A negative result may occur with  improper specimen collection/handling, submission of specimen other than nasopharyngeal swab, presence of viral mutation(s) within the areas targeted by this assay, and inadequate number of viral copies(<138 copies/mL). A negative result must be combined with clinical observations, patient history, and epidemiological information. The expected result is Negative.  Fact Sheet for Patients:  EntrepreneurPulse.com.au  Fact Sheet for Healthcare Providers:  IncredibleEmployment.be  This test is no t yet approved or cleared by the [place] FDA and  has been authorized for detection and/or diagnosis of SARS-CoV-2 by FDA under an Emergency Use Authorization (EUA). This EUA will remain  in effect (meaning this test can be used) for the duration of the COVID-19 declaration under Section 564(b)(1) of the Act, 21 U.S.C.section 360bbb-3(b)(1), unless the authorization is terminated  or revoked sooner.       Influenza A by PCR NEGATIVE NEGATIVE Final   Influenza B by PCR NEGATIVE NEGATIVE Final    Comment: (NOTE) The Xpert Xpress SARS-CoV-2/FLU/RSV plus assay is intended as an aid in the diagnosis of influenza from Nasopharyngeal swab specimens and should not be used as a sole basis  for treatment. Nasal washings and aspirates are unacceptable for Xpert Xpress SARS-CoV-2/FLU/RSV testing.  Fact Sheet for Patients: EntrepreneurPulse.com.au  Fact Sheet for Healthcare Providers: IncredibleEmployment.be  This test is not yet approved or cleared by the [place] FDA and has been authorized for detection and/or diagnosis of SARS-CoV-2 by FDA under an Emergency Use Authorization (EUA). This EUA will remain in effect (meaning this test can be used) for the duration of the COVID-19 declaration under Section 564(b)(1) of the Act, 21 U.S.C. section 360bbb-3(b)(1), unless the authorization is terminated or revoked.  Performed at [name], [address] [place], [place] [address]   Resp Panel by RT-PCR (Flu A&B, Covid) Nasopharyngeal Swab     Status: None   Collection Time: 05/12/21  5:30 PM   Specimen: Nasopharyngeal Swab; Nasopharyngeal(NP) swabs in vial transport medium  Result Value Ref Range Status   SARS Coronavirus 2 by RT PCR NEGATIVE NEGATIVE Final    Comment: (NOTE) SARS-CoV-2 target nucleic acids are NOT DETECTED.  The SARS-CoV-2 RNA is generally detectable in upper respiratory specimens during the acute phase of infection. The lowest concentration of SARS-CoV-2 viral copies this assay can detect is 138 copies/mL. A negative result does not preclude SARS-Cov-2 infection and should not be used as the sole basis for treatment or other patient management decisions. A negative result may occur with  improper specimen collection/handling, submission of specimen other than nasopharyngeal swab, presence of viral mutation(s) within the areas targeted by this assay, and inadequate number of viral copies(<138 copies/mL). A negative result must be combined with clinical observations, patient history, and epidemiological information. The expected result is Negative.  Fact Sheet for Patients:   EntrepreneurPulse.com.au  Fact Sheet for Healthcare Providers:  IncredibleEmployment.be  This test is no t yet approved or cleared by the [place] FDA and  has been authorized for detection and/or diagnosis of SARS-CoV-2 by FDA under an Emergency Use Authorization (EUA). This EUA will remain  in effect (meaning this test can be used) for the duration of the COVID-19 declaration under Section 564(b)(1) of the Act, 21 U.S.C.section 360bbb-3(b)(1), unless the authorization is terminated  or revoked sooner.  Influenza A by PCR NEGATIVE NEGATIVE Final   Influenza B by PCR NEGATIVE NEGATIVE Final    Comment: (NOTE) The Xpert Xpress SARS-CoV-2/FLU/RSV plus assay is intended as an aid in the diagnosis of influenza from Nasopharyngeal swab specimens and should not be used as a sole basis for treatment. Nasal washings and aspirates are unacceptable for Xpert Xpress SARS-CoV-2/FLU/RSV testing.  Fact Sheet for Patients: EntrepreneurPulse.com.au  Fact Sheet for Healthcare Providers: IncredibleEmployment.be  This test is not yet approved or cleared by the [place] FDA and has been authorized for detection and/or diagnosis of SARS-CoV-2 by FDA under an Emergency Use Authorization (EUA). This EUA will remain in effect (meaning this test can be used) for the duration of the COVID-19 declaration under Section 564(b)(1) of the Act, 21 U.S.C. section 360bbb-3(b)(1), unless the authorization is terminated or revoked.  Performed at [facility], [address] [place], [place] [address]      Radiology Studies: NM GI Blood Loss  Result Date: 05/13/2021 CLINICAL DATA:  Hematochezia EXAM: NUCLEAR MEDICINE GASTROINTESTINAL BLEEDING SCAN TECHNIQUE: Sequential abdominal images were obtained following intravenous administration of Tc-75mlabeled red blood cells. RADIOPHARMACEUTICALS:  24.5 mCi Tc-956mertechnetate  in-vitro labeled red cells. COMPARISON:  None. FINDINGS: Normal distribution of radiotracer within the blood pool, liver, and spleen. No evidence of active gastrointestinal hemorrhage. IMPRESSION: No evidence of active gastrointestinal hemorrhage. Electronically Signed   By: AsFidela SalisburyD   On: 05/13/2021 00:03    Scheduled Meds:  atorvastatin  10 mg Oral Daily   Chlorhexidine Gluconate Cloth  6 each Topical Q0600   insulin aspart  0-5 Units Subcutaneous QHS   insulin aspart  0-9 Units Subcutaneous TID WC   insulin glargine  30 Units Subcutaneous QHS   levothyroxine  50 mcg Oral QAC breakfast   metoprolol succinate  50 mg Oral QHS   pantoprazole (PROTONIX) IV  40 mg Intravenous Q12H   sevelamer carbonate  800 mg Oral TID WC   sodium bicarbonate  50 mEq Intravenous Once   sodium hypochlorite  1 application Irrigation Daily   torsemide  150 mg Oral Daily   valACYclovir  500 mg Oral Q24H   Continuous Infusions:  sodium chloride     sodium chloride     calcium gluconate     desmopressin (DDAVP) IV for Bleeding     sodium thiosulfate infusion for calciphylaxis      LOS: 1 day   Time spent: 32 mins   [place] JoWynetta EmeryMD How to contact the TRThe [facility] LLCttending or Consulting provider 7ABrownsburgr covering provider during after hours 7POakwood Parkfor this patient?  Check the care team in [facility] look for a) attending/consulting TRH provider listed and b) the TRPermian Regional Medical Centeream listed Log into [email or web address] and use [place]'s universal password to access. If you do not have the password, please contact the hospital operator. Locate the TRNexus Specialty Hospital-Shenando[facility] Campusrovider you are looking for under [facility] and page to a number that you can be directly reached. If you still have difficulty reaching the provider, please page the DOSun City Center Ambulatory Surgery CenterDirector on Call) for the Hospitalists listed on amion for assistance.  05/13/2021, 1:14 PM

## 2021-05-14 ENCOUNTER — Inpatient Hospital Stay (HOSPITAL_COMMUNITY): Payer: Medicare Other | Admitting: Anesthesiology

## 2021-05-14 ENCOUNTER — Encounter (HOSPITAL_COMMUNITY)
Admission: EM | Disposition: A | Discharge status: Home / Self Care | Payer: Self-pay | Source: Home / Self Care | Attending: Family Medicine

## 2021-05-14 DIAGNOSIS — K621 Rectal polyp: Secondary | ICD-10-CM

## 2021-05-14 HISTORY — PX: COLONOSCOPY WITH PROPOFOL: SHX5780

## 2021-05-14 HISTORY — PX: HEMOSTASIS CLIP PLACEMENT: SHX6857

## 2021-05-14 HISTORY — PX: POLYPECTOMY: SHX5525

## 2021-05-14 LAB — GLUCOSE, CAPILLARY
Glucose-Capillary: 100 mg/dL — ABNORMAL HIGH (ref 70–99)
Glucose-Capillary: 102 mg/dL — ABNORMAL HIGH (ref 70–99)
Glucose-Capillary: 57 mg/dL — ABNORMAL LOW (ref 70–99)
Glucose-Capillary: 76 mg/dL (ref 70–99)
Glucose-Capillary: 126 mg/dL — ABNORMAL HIGH (ref 70–99)
Glucose-Capillary: 130 mg/dL — ABNORMAL HIGH (ref 70–99)

## 2021-05-14 LAB — TYPE AND SCREEN
ABO/RH(D): O POS
Antibody Screen: NEGATIVE
Unit division: 0
Unit division: 0

## 2021-05-14 LAB — RENAL FUNCTION PANEL
Albumin: 2.3 g/dL — ABNORMAL LOW (ref 3.5–5.0)
Anion gap: 8 (ref 5–15)
BUN: 26 mg/dL — ABNORMAL HIGH (ref 6–20)
CO2: 29 mmol/L (ref 22–32)
Calcium: 8.2 mg/dL — ABNORMAL LOW (ref 8.9–10.3)
Chloride: 95 mmol/L — ABNORMAL LOW (ref 98–111)
Creatinine, Ser: 3.33 mg/dL — ABNORMAL HIGH (ref 0.44–1.00)
GFR, Estimated: 17 mL/min — ABNORMAL LOW (ref >60)
Glucose, Bld: 97 mg/dL (ref 70–99)
Phosphorus: 4.8 mg/dL — ABNORMAL HIGH (ref 2.5–4.6)
Potassium: 4.9 mmol/L (ref 3.5–5.1)
Sodium: 132 mmol/L — ABNORMAL LOW (ref 135–145)

## 2021-05-14 LAB — BPAM RBC
Blood Product Expiration Date: 202206292359
Blood Product Expiration Date: 202207272359
ISSUE DATE / TIME: 202206221807
ISSUE DATE / TIME: 202206231526
Unit Type and Rh: 5100
Unit Type and Rh: 5100

## 2021-05-14 LAB — CBC
HCT: 24.8 % — ABNORMAL LOW (ref 36.0–46.0)
Hemoglobin: 7.7 g/dL — ABNORMAL LOW (ref 12.0–15.0)
MCH: 27 pg (ref 26.0–34.0)
MCHC: 31 g/dL (ref 30.0–36.0)
MCV: 87 fL (ref 80.0–100.0)
Platelets: 217 10*3/uL (ref 150–400)
RBC: 2.85 MIL/uL — ABNORMAL LOW (ref 3.87–5.11)
RDW: 15.9 % — ABNORMAL HIGH (ref 11.5–15.5)
WBC: 7.4 10*3/uL (ref 4.0–10.5)
nRBC: 0 % (ref 0.0–0.2)

## 2021-05-14 LAB — MRSA NEXT GEN BY PCR, NASAL: MRSA by PCR Next Gen: DETECTED — AB

## 2021-05-14 LAB — MAGNESIUM: Magnesium: 2.1 mg/dL (ref 1.7–2.4)

## 2021-05-14 SURGERY — COLONOSCOPY WITH PROPOFOL
Anesthesia: General

## 2021-05-14 MED ORDER — SODIUM CHLORIDE 0.9 % IV SOLN: INTRAVENOUS | Status: DC

## 2021-05-14 MED ORDER — LIDOCAINE HCL (CARDIAC) PF 100 MG/5ML IV SOSY
PREFILLED_SYRINGE | INTRAVENOUS | Status: DC | PRN
  Administered 2021-05-14: 60 mg via INTRAVENOUS

## 2021-05-14 MED ORDER — PROPOFOL 10 MG/ML IV BOLUS
INTRAVENOUS | Status: DC | PRN
  Administered 2021-05-14: 125 ug/kg/min via INTRAVENOUS

## 2021-05-14 MED ORDER — DEXTROSE 50 % IV SOLN: 25.0000 mL | Freq: Once | INTRAVENOUS | Status: AC

## 2021-05-14 MED ORDER — DARBEPOETIN ALFA 150 MCG/0.3ML IJ SOSY: 150.0000 ug | PREFILLED_SYRINGE | INTRAMUSCULAR | Status: DC

## 2021-05-14 MED ORDER — SODIUM THIOSULFATE 250 MG/ML IV SOLN: 25.0000 g | Freq: Once | INTRAVENOUS | Status: DC

## 2021-05-14 MED ORDER — CHLORHEXIDINE GLUCONATE CLOTH 2 % EX PADS
6.0000 | MEDICATED_PAD | Freq: Every day | CUTANEOUS | Status: DC
  Administered 2021-05-15: 6 via TOPICAL

## 2021-05-14 MED ORDER — SODIUM THIOSULFATE 250 MG/ML IV SOLN
25.0000 g | INTRAVENOUS | Status: DC
  Administered 2021-05-15: 25 g via INTRAVENOUS
  Filled 2021-05-14: qty 100

## 2021-05-14 MED ORDER — SODIUM CHLORIDE 0.9 % IV SOLN: INTRAVENOUS | Status: DC | PRN

## 2021-05-14 MED ORDER — DEXTROSE 50 % IV SOLN
INTRAVENOUS | Status: AC
  Administered 2021-05-14: 25 mL via INTRAVENOUS
  Filled 2021-05-14: qty 50

## 2021-05-14 MED ORDER — GLUCOSE 40 % PO GEL
1.0000 | Freq: Once | ORAL | Status: AC
  Administered 2021-05-14: 31 g via ORAL
  Filled 2021-05-14: qty 1

## 2021-05-14 MED ORDER — GLUCOSE 40 % PO GEL: 1.0000 | ORAL | Status: AC

## 2021-05-14 NOTE — Progress Notes (Signed)
Dressing's changed to bilateral thighs. Patient tolerated well.

## 2021-05-14 NOTE — Interval H&P Note (Signed)
History and Physical Interval Note:  05/14/2021 10:41 AM  Amy Moses  has presented today for surgery, with the diagnosis of rectal bleeding, anemia, s/p recent polypectomy.  The various methods of treatment have been discussed with the patient and family. After consideration of risks, benefits and other options for treatment, the patient has consented to  Procedure(s): COLONOSCOPY WITH PROPOFOL (N/A) as a surgical intervention.  The patient's history has been reviewed, patient examined, no change in status, stable for surgery.  I have reviewed the patient's chart and labs.  Questions were answered to the patient's satisfaction.     Manus Rudd  Seen and examined in short stay.  No change.  Colonoscopy today per plan. The risks, benefits, limitations, alternatives and imponderables have been reviewed with the patient. Questions have been answered. All parties are agreeable.

## 2021-05-14 NOTE — H&P (View-Only) (Signed)
Spoke with patient with RN at bedside. Just completed enema with bloody output, no solid stool or stool pieces. Hgb 7.7. Has received 2 units of PRBCs (6/22 and 6/23). Dialysis completed yesterday. Hyperkalemia resolved. NPO except sips with meds.   Diagnostic colonoscopy planned for today. Risks and benefits again discussed with patient with stated understanding. Due to shingles, she is on airborne precautions.    Annitta Needs, PhD, ANP-BC [facility]

## 2021-05-14 NOTE — Anesthesia Preprocedure Evaluation (Addendum)
Anesthesia Evaluation  Patient identified by MRN, date of birth, ID band Patient awake    Reviewed: Allergy & Precautions, NPO status , Patient's Chart, lab work & pertinent test results  History of Anesthesia Complications Negative for: history of anesthetic complications  Airway Mallampati: II  TM Distance: >3 FB Neck ROM: Full    Dental  (+) Dental Advisory Given   Pulmonary pneumonia,    Pulmonary exam normal breath sounds clear to auscultation       Cardiovascular hypertension, Pt. on medications +CHF  Normal cardiovascular exam Rhythm:Regular Rate:Normal  1. Left ventricular ejection fraction, by estimation, is 55 to 60%. The left ventricle has normal function. The left ventricle has no regional wall motion abnormalities. Left ventricular diastolic parameters are  consistent with Grade II diastolic dysfunction (pseudonormalization).  2. Right ventricular systolic function is normal. The right ventricular size is normal.  3. Left atrial size was mildly dilated.  4. A small pericardial effusion is present.  5. The mitral valve is normal in structure. No evidence of mitral valve regurgitation.  6. Tricuspid valve regurgitation is mild to moderate.  7. The aortic valve is normal in structure. Aortic valve regurgitation is  not visualized.    Neuro/Psych negative neurological ROS  negative psych ROS   GI/Hepatic negative GI ROS, Neg liver ROS,   Endo/Other  diabetes, Poorly Controlled, Type 2, Oral Hypoglycemic AgentsHypothyroidism   Renal/GU ESRFRenal disease     Musculoskeletal   Abdominal   Peds  Hematology  (+) anemia ,   Anesthesia Other Findings Active shingles   Reproductive/Obstetrics                           Anesthesia Physical Anesthesia Plan  ASA: 3  Anesthesia Plan: General   Post-op Pain Management:    Induction: Intravenous  PONV Risk Score and Plan:  Propofol infusion  Airway Management Planned: Nasal Cannula and Natural Airway  Additional Equipment:   Intra-op Plan:   Post-operative Plan:   Informed Consent: I have reviewed the patients History and Physical, chart, labs and discussed the procedure including the risks, benefits and alternatives for the proposed anesthesia with the patient or authorized representative who has indicated his/her understanding and acceptance.     Dental advisory given  Plan Discussed with: CRNA and Surgeon  Anesthesia Plan Comments:       Anesthesia Quick Evaluation

## 2021-05-14 NOTE — TOC Initial Note (Addendum)
Transition of Care [facility]) - Initial/Assessment Note    Patient Details  Name: Amy Moses MRN: [id number] Date of Birth: Mar 22, 1971  Transition of Care [facility]) CM/SW Contact:    Iona Beard, [place] Phone Number: 05/14/2021, 2:49 PM  Clinical Narrative:                 Pt is high risk for readmission. CSW spoke with pt who states that she lives with her boyfriend and daughter. Pt is independent in completing her ADLs however her daughter is present to help if needed. Pt is not currently driving but family can provide transportation when needed. Pt has had [place] RN services with [facility]. Phone number [phone] fax [phone]. Pt will need new orders for [facility] RN at d/c. Pt has a wheelchair and BSC to use when needed. Pt states that someone will be able to provide transportation for her at D/C. TOC to follow.   TOC received return call from [facility] stating that they will not continue to follow pt at discharge as they feel her wound care needs are too complicated for [facility] RN to manage. Updated MD. Julienne Kass TOC will continue to follow.  Expected Discharge Plan: [place] Barriers to Discharge: Continued Medical Work up   Patient Goals and CMS Choice Patient states their goals for this hospitalization and ongoing recovery are:: Return home CMS Medicare.gov Compare Post Acute Care list provided to:: Patient Choice offered to / list presented to : Patient  Expected Discharge Plan and Services Expected Discharge Plan: [place] Choice: [place] arrangements for the past 2 months: Single Family Home                           HH Arranged: RN [facility] Agency: [facility]        Prior Living Arrangements/Services Living arrangements for the past 2 months: Single Family Home Lives with:: Adult Children, Significant Other Patient language and need for interpreter reviewed:: Yes Do you feel safe going back to the  place where you live?: Yes      Need for Family Participation in Patient Care: Yes (Comment) Care giver support system in place?: Yes (comment) Current home services: DME (Wheelchair and [name]) Criminal Activity/Legal Involvement Pertinent to Current Situation/Hospitalization: No - Comment as needed  Activities of Daily Living Home Assistive Devices/Equipment: Bedside commode/3-in-1, Shower chair with back, Wheelchair ADL Screening (condition at time of admission) Patient's cognitive ability adequate to safely complete daily activities?: Yes Is the patient deaf or have difficulty hearing?: No Does the patient have difficulty seeing, even when wearing glasses/contacts?: Yes Does the patient have difficulty concentrating, remembering, or making decisions?: No Patient able to express need for assistance with ADLs?: Yes Does the patient have difficulty dressing or bathing?: No Independently performs ADLs?: Yes (appropriate for developmental age) Communication: Independent, Independent with device (comment) Dressing (OT): Appropriate for developmental age Feeding: Appropriate for developmental age Bathing: Needs assistance Is this a change from baseline?: Pre-admission baseline Toileting: Needs assistance Is this a change from baseline?: Pre-admission baseline In/Out Bed: Independent, Needs assistance Is this a change from baseline?: Pre-admission baseline Walks in Home: Dependent Is this a change from baseline?: Pre-admission baseline Does the patient have difficulty walking or climbing stairs?: Yes Weakness of Legs: Right Weakness of Arms/Hands: None  Permission Sought/Granted  Emotional Assessment Appearance:: Appears stated age Attitude/Demeanor/Rapport: Engaged Affect (typically observed): Accepting Orientation: : Oriented to Self, Oriented to Place, Oriented to  Time, Oriented to Situation Alcohol / Substance Use: Not Applicable Psych Involvement: No  (comment)  Admission diagnosis:  Rectal bleeding [K62.5] Lower GI bleed [K92.2] Patient Active Problem List   Diagnosis Date Noted   Lower GI bleed 05/12/2021   Blindness 05/10/2021   Shingles 05/10/2021   Acute GI bleeding 04/24/2021   Acute blood loss anemia 04/23/2021   GI bleed 04/22/2021   Fever 03/31/2021   Acute on chronic heart failure with preserved ejection fraction (HFpEF) ([place]) 03/30/2021   Pressure injury of skin 03/30/2021   End stage renal disease ([place]) 11/16/2020   Calciphylaxis 11/06/2020   Non-healing open wound of heel 11/03/2020   Diabetic foot infection ([place]) 11/01/2020   Decubitus ulcer, heel 11/01/2020   Closed nondisplaced fracture of left patella [id number]   Metabolic acidosis [id number]   Acute on chronic renal failure ([place]) 06/10/2020   Symptomatic anemia 06/10/2020   Acute pericardial effusion 06/10/2020   Other acute nonsuppurative otitis media, unspecified ear 11/29/2019   Chronic kidney disease, stage 4 (severe) ([place]) 03/05/2019   Skin ulcer, limited to breakdown of skin ([place]) 01/28/2019   Vitamin D deficiency 01/28/2019   Uncontrolled type 2 diabetes mellitus with chronic kidney disease, with long-term current use of insulin ([place]) 09/21/2015   Hyperlipidemia 09/21/2015   Essential hypertension, benign 09/21/2015   Primary hypothyroidism 09/21/2015   Iris bomb 07/31/2012   Secondary angle-closure glaucoma 07/31/2012   PCP:  Leeanne Rio, MD Pharmacy:   [id number] [place] [address] [address]Phone: [phone]: [phone]    Social Determinants of Health (SDOH) Interventions    Readmission Risk Interventions Readmission Risk Prevention Plan 05/14/2021  Transportation Screening Complete  Medication Review ([place] Complete  [place]RI or [place]  SW Recovery Care/Counseling Consult Complete  Palliative Care Screening Not Applicable   Skilled Nursing Facility Not Applicable  Some recent data might be hidden

## 2021-05-14 NOTE — Op Note (Addendum)
[facility] Patient Name: Amy Moses Procedure Date: 05/14/2021 10:44 AM MRN: [id number] Date of Birth: 1971/05/25 Attending MD: Norvel Richards , MD CSN: [id number] Age: 50 Admit Type: Inpatient Procedure:                Colonoscopy Indications:              Hematochezia Providers:                Norvel Richards, MD, Hinton Rao, RN, Nelma Rothman, Technician, Jeanann Lewandowsky. Sharon Seller, RN Referring MD:              Medicines:                Propofol per Anesthesia Complications:            No immediate complications. Estimated Blood Loss:     Estimated blood loss was minimal. Procedure:                Pre-Anesthesia Assessment:                           - Prior to the procedure, a History and Physical                            was performed, and patient medications and                            allergies were reviewed. The patient's tolerance of                            previous anesthesia was also reviewed. The risks                            and benefits of the procedure and the sedation                            options and risks were discussed with the patient.                            All questions were answered, and informed consent                            was obtained. Prior Anticoagulants: The patient has                            taken no previous anticoagulant or antiplatelet                            agents. ASA Grade Assessment: III - A patient with                            severe systemic disease. After reviewing the risks  and benefits, the patient was deemed in                            satisfactory condition to undergo the procedure.                           After obtaining informed consent, the colonoscope                            was passed under direct vision. Throughout the                            procedure, the patient's blood pressure, pulse, and                             oxygen saturations were monitored continuously. The                            [id number] scope was introduced through                            the anus and advanced to the 10 cm into the ileum.                            The colonoscopy was performed without difficulty.                            The patient tolerated the procedure well. The                            quality of the bowel preparation was adequate. Scope In: 10:54:04 AM Scope Out: 11:25:16 AM Scope Withdrawal Time: 0 hours 26 minutes 32 seconds  Total Procedure Duration: 0 hours 31 minutes 12 seconds  Findings:      The perianal and digital rectal examinations were normal.      A 5 mm polyp was found in the proximal rectum. The polyp was sessile.       The polyp was removed with a cold snare. Resection and retrieval were       complete. Estimated blood loss was minimal.      Prior polypectomy site identified in the proximal transverse/hepatic       flexure area. Polypectomy site looked good good except for a small       vascular protuberance. See photos. There was scattered mucus laden clot       throughout the colon; copious washing undertaken. I advanced the scope       to the cecum these findings persisted throughout the colon without focal       bleeding lesions seen otherwise. I intubated the TI which appeared       normal I pulled back to the rectum and then went back to the cecum once       again no other lesions seen. I elected to close the polypectomy site       with 3 clips. 3 clips placed. Good hemostasis maintained. No apparent       complications. Impression:               -  One 5 mm polyp in the proximal rectum, removed                            with a cold snare. Resected and retrieved. Findings                            suspicious for post polypectomy bleeding. Moderate Sedation:      Moderate (conscious) sedation was personally administered by an       anesthesia professional. The following  parameters were monitored: oxygen       saturation, heart rate, blood pressure, respiratory rate, EKG, adequacy       of pulmonary ventilation, and response to care. Recommendation:           - Return patient to hospital ward for observation.                           - Advance diet as tolerated -renal. Follow-up on                            pathology. No MRI in the future until clips gone.                            At patient request, I called Luvenia Starch at                            [phone]?"rolled to voicemail. "Voicemail box                            not set up yet" Procedure Code(s):        --- Professional ---                           [id number], Colonoscopy, flexible; with removal of                            tumor(s), polyp(s), or other lesion(s) by snare                            technique Diagnosis Code(s):        --- Professional ---                           K62.1, Rectal polyp                           K92.1, Melena (includes Hematochezia) CPT copyright 2019 American Medical Association. All rights reserved. The codes documented in this report are preliminary and upon coder review may  be revised to meet current compliance requirements. Cristopher Estimable. Smith Mcnicholas, MD Norvel Richards, MD 05/14/2021 11:36:02 AM This report has been signed electronically. Number of Addenda: 0

## 2021-05-14 NOTE — Progress Notes (Signed)
PROGRESS NOTE   Amy Moses  [id number] DOB: August 21, 1971 DOA: 05/12/2021 PCP: Leeanne Rio, MD   Chief Complaint  Patient presents with   Rectal Bleeding   Level of care: Med-Surg  Brief Admission History:  50 y.o. female, with medical history significant of Anemia, Blind R eye, DM, HLD, HTN, hypothyroidism, ESRD on dialysis. Dialysis T/TH/Sat, patient was just discharged yesterday due to lower GI bleed, patient with recent hospitalization 6/3 for GI bleed, s/p endoscopy/colonoscopy, significant for gastritis, duodenal erosions and polyp with bleeding stigmata, status post polypectomy, patient was readmitted on 6/20 for  another episode of bright red blood per rectum, but this has resolved without intervention, and her hemoglobin has been stable, she so she was discharged 6/21, patient reports significant amount of bright red blood per rectum overnight, as well she does report dizziness, lightheadedness, and some shortness of breath, which prompted her to come to the ED, of note last hemodialysis was on Saturday, and patient missed her hemodialysis today, but reports she is making urine, she denies any abdominal pain, loose stools, chest pain, palpitation. -In ED her hemoglobin was noted to be low at 7.2, down from 9.5 upon discharge yesterday afternoon, she had Hemoccult positive stool, she had significant hematochezia in the commode, her potassium was 5.1, BUN was 55, [facility] hospitalist consulted to admit.  Assessment & Plan:   Active Problems:   Uncontrolled type 2 diabetes mellitus with chronic kidney disease, with long-term current use of insulin (HCC)   Hyperlipidemia   Primary hypothyroidism   Calciphylaxis   Acute blood loss anemia   Blindness   Shingles   Lower GI bleed   Recurrent lower GI bleeding - acute blood loss anemia. S/p 2 unit PRBC. Appreciate GI consult with plans for repeat colonoscopy.  Pt had colonoscopy today and it is thought she is having post  polypectomy bleeding.  Follow CBC.  Renal  diet.    ESRD on HD - nephrology team consulted and planning for HD treatment today.  Follow renal function panel.   Calciphylaxis - Pt is followed by [name] in [facility].  Wound care RN has evaluated and orders placed.    Shingles Zoster - Pt is completing a 7 day course of valtrex 500 mg daily.  Lesions are healing and now pruritic (Pt advised not to scratch). Contact precautions.    Blindness - assist with feeding order placed.   Hyperlipidemia - resume home statin therapy.    Hypothyroidism - stable on  home regimen  DVT prophylaxis: SCD Code Status: Full  Family Communication: plan of care discussed with patient who verbalized understanding  Disposition: home  Status is: Inpatient  Remains inpatient appropriate because:IV treatments appropriate due to intensity of illness or inability to take PO and Inpatient level of care appropriate due to severity of illness  Dispo: The patient is from: Home              Anticipated d/c is to: Home              Patient currently is not medically stable to d/c.   Difficult to place patient No   Consultants:  Nephrology GI   Procedures:  hemodialysis  Antimicrobials:  Valtrex  6/21>>  Subjective: Pt reports shingles rash is itching.    Objective: Vitals:   05/14/21 0607 05/14/21 1130 05/14/21 1317 05/14/21 1409  BP: 114/66 (!) 94/54 128/69   Pulse: 63 (!) 59 63   Resp: 18  18   Temp:    (!)  97.5 F (36.4 C)  TempSrc:    Oral  SpO2: 100% 99% 100%   Weight:      Height:        Intake/Output Summary (Last 24 hours) at 05/14/2021 1637 Last data filed at 05/14/2021 1122 Gross per 24 hour  Intake 635 ml  Output -310 ml  Net 945 ml   Filed Weights   05/12/21 1512 05/13/21 1255  Weight: 80.4 kg 89.8 kg    Examination:  General exam: chronically ill appearing female, Appears calm and comfortable  Respiratory system: Clear to auscultation. Respiratory effort normal. Cardiovascular  system: normal S1 & S2 heard. No JVD, murmurs, rubs, gallops or clicks. No pedal edema. Gastrointestinal system: Abdomen is nondistended, soft and nontender. No organomegaly or masses felt. Normal bowel sounds heard. Central nervous system: Alert and oriented. No focal neurological deficits. Extremities: Symmetric 5 x 5 power. Skin: left chest wall with healing crusted herpetic lesions c/w healing shingles infection.  Psychiatry: Judgement and insight appear normal. Mood & affect appropriate.   Data Reviewed: I have personally reviewed following labs and imaging studies  CBC: Recent Labs  Lab 05/10/21 1607 05/11/21 0135 05/11/21 0512 05/11/21 0923 05/12/21 1527 05/12/21 2353 05/13/21 0814 05/13/21 1219 05/13/21 1903 05/14/21 0613  WBC 7.3  --  8.9  --  8.4 8.3 8.8  --   --  7.4  NEUTROABS 5.2  --   --   --  5.9  --   --   --   --   --   HGB 10.1*   < > 8.7*   < > 7.2* 8.5* 7.6* 7.2* 7.6* 7.7*  HCT 33.3*   < > 29.0*   < > 23.9* 27.5* 24.8* 23.5* 23.7* 24.8*  MCV 87.9  --  87.3  --  88.2 87.9 88.3  --   --  87.0  PLT 318  --  296  --  328 279 278  --   --  217   < > = values in this interval not displayed.    Basic Metabolic Panel: Recent Labs  Lab 05/10/21 1607 05/11/21 0512 05/12/21 1527 05/13/21 0455 05/14/21 0613  NA 137 137 138 134* 132*  K 4.7 4.3 5.1 6.9* 4.9  CL 93* 97* 100 100 95*  CO2 26 25 21* 17* 29  GLUCOSE 267* 191* 170* 234* 97  BUN 40* 45* 55* 60* 26*  CREATININE 5.16* 5.37* 6.51* 6.32* 3.33*  CALCIUM 9.6 9.3 9.2 8.3* 8.2*  MG  --  2.8*  --   --  2.1  PHOS  --  5.6*  --   --  4.8*    GFR: Estimated Creatinine Clearance: 23.6 mL/min (A) (by C-G formula based on SCr of 3.33 mg/dL (H)).  Liver Function Tests: Recent Labs  Lab 05/11/21 0512 05/12/21 1527 05/13/21 0455 05/14/21 0613  AST 8* 9* 10*  --   ALT '8 8 9  '$ --   ALKPHOS 111 108 94  --   BILITOT 0.7 0.4 0.8  --   PROT 5.6* 5.7* 5.0*  --   ALBUMIN 2.6* 2.7* 2.3* 2.3*    CBG: Recent  Labs  Lab 05/14/21 0609 05/14/21 0744 05/14/21 1311 05/14/21 1321 05/14/21 1635  GLUCAP 100* 102* 57* 76 126*    Recent Results (from the past 240 hour(s))  Resp Panel by RT-PCR (Flu A&B, Covid) Nasopharyngeal Swab     Status: None   Collection Time: 05/10/21  7:49 PM   Specimen: Nasopharyngeal Swab; Nasopharyngeal(NP)  swabs in vial transport medium  Result Value Ref Range Status   SARS Coronavirus 2 by RT PCR NEGATIVE NEGATIVE Final    Comment: (NOTE) SARS-CoV-2 target nucleic acids are NOT DETECTED.  The SARS-CoV-2 RNA is generally detectable in upper respiratory specimens during the acute phase of infection. The lowest concentration of SARS-CoV-2 viral copies this assay can detect is 138 copies/mL. A negative result does not preclude SARS-Cov-2 infection and should not be used as the sole basis for treatment or other patient management decisions. A negative result may occur with  improper specimen collection/handling, submission of specimen other than nasopharyngeal swab, presence of viral mutation(s) within the areas targeted by this assay, and inadequate number of viral copies(<138 copies/mL). A negative result must be combined with clinical observations, patient history, and epidemiological information. The expected result is Negative.  Fact Sheet for Patients:  EntrepreneurPulse.com.au  Fact Sheet for Healthcare Providers:  IncredibleEmployment.be  This test is no t yet approved or cleared by the [place] FDA and  has been authorized for detection and/or diagnosis of SARS-CoV-2 by FDA under an Emergency Use Authorization (EUA). This EUA will remain  in effect (meaning this test can be used) for the duration of the COVID-19 declaration under Section 564(b)(1) of the Act, 21 U.S.C.section 360bbb-3(b)(1), unless the authorization is terminated  or revoked sooner.       Influenza A by PCR NEGATIVE NEGATIVE Final   Influenza B  by PCR NEGATIVE NEGATIVE Final    Comment: (NOTE) The Xpert Xpress SARS-CoV-2/FLU/RSV plus assay is intended as an aid in the diagnosis of influenza from Nasopharyngeal swab specimens and should not be used as a sole basis for treatment. Nasal washings and aspirates are unacceptable for Xpert Xpress SARS-CoV-2/FLU/RSV testing.  Fact Sheet for Patients: EntrepreneurPulse.com.au  Fact Sheet for Healthcare Providers: IncredibleEmployment.be  This test is not yet approved or cleared by the [place] FDA and has been authorized for detection and/or diagnosis of SARS-CoV-2 by FDA under an Emergency Use Authorization (EUA). This EUA will remain in effect (meaning this test can be used) for the duration of the COVID-19 declaration under Section 564(b)(1) of the Act, 21 U.S.C. section 360bbb-3(b)(1), unless the authorization is terminated or revoked.  Performed at [name], [address] [place], [place] [address]   Resp Panel by RT-PCR (Flu A&B, Covid) Nasopharyngeal Swab     Status: None   Collection Time: 05/12/21  5:30 PM   Specimen: Nasopharyngeal Swab; Nasopharyngeal(NP) swabs in vial transport medium  Result Value Ref Range Status   SARS Coronavirus 2 by RT PCR NEGATIVE NEGATIVE Final    Comment: (NOTE) SARS-CoV-2 target nucleic acids are NOT DETECTED.  The SARS-CoV-2 RNA is generally detectable in upper respiratory specimens during the acute phase of infection. The lowest concentration of SARS-CoV-2 viral copies this assay can detect is 138 copies/mL. A negative result does not preclude SARS-Cov-2 infection and should not be used as the sole basis for treatment or other patient management decisions. A negative result may occur with  improper specimen collection/handling, submission of specimen other than nasopharyngeal swab, presence of viral mutation(s) within the areas targeted by this assay, and inadequate number of  viral copies(<138 copies/mL). A negative result must be combined with clinical observations, patient history, and epidemiological information. The expected result is Negative.  Fact Sheet for Patients:  EntrepreneurPulse.com.au  Fact Sheet for Healthcare Providers:  IncredibleEmployment.be  This test is no t yet approved or cleared by the [place] and  has been authorized for detection and/or diagnosis of SARS-CoV-2 by FDA under an Emergency Use Authorization (EUA). This EUA will remain  in effect (meaning this test can be used) for the duration of the COVID-19 declaration under Section 564(b)(1) of the Act, 21 U.S.C.section 360bbb-3(b)(1), unless the authorization is terminated  or revoked sooner.       Influenza A by PCR NEGATIVE NEGATIVE Final   Influenza B by PCR NEGATIVE NEGATIVE Final    Comment: (NOTE) The Xpert Xpress SARS-CoV-2/FLU/RSV plus assay is intended as an aid in the diagnosis of influenza from Nasopharyngeal swab specimens and should not be used as a sole basis for treatment. Nasal washings and aspirates are unacceptable for Xpert Xpress SARS-CoV-2/FLU/RSV testing.  Fact Sheet for Patients: EntrepreneurPulse.com.au  Fact Sheet for Healthcare Providers: IncredibleEmployment.be  This test is not yet approved or cleared by the [place] FDA and has been authorized for detection and/or diagnosis of SARS-CoV-2 by FDA under an Emergency Use Authorization (EUA). This EUA will remain in effect (meaning this test can be used) for the duration of the COVID-19 declaration under Section 564(b)(1) of the Act, 21 U.S.C. section 360bbb-3(b)(1), unless the authorization is terminated or revoked.  Performed at [facility], [address] [place], [place] [address]      Radiology Studies: NM GI Blood Loss  Result Date: 05/13/2021 CLINICAL DATA:  Hematochezia EXAM: NUCLEAR MEDICINE  GASTROINTESTINAL BLEEDING SCAN TECHNIQUE: Sequential abdominal images were obtained following intravenous administration of Tc-31mlabeled red blood cells. RADIOPHARMACEUTICALS:  24.5 mCi Tc-950mertechnetate in-vitro labeled red cells. COMPARISON:  None. FINDINGS: Normal distribution of radiotracer within the blood pool, liver, and spleen. No evidence of active gastrointestinal hemorrhage. IMPRESSION: No evidence of active gastrointestinal hemorrhage. Electronically Signed   By: AsFidela SalisburyD   On: 05/13/2021 00:03    Scheduled Meds:  atorvastatin  10 mg Oral Daily   Chlorhexidine Gluconate Cloth  6 each Topical Q0600   Chlorhexidine Gluconate Cloth  6 each Topical Q0600   darbepoetin (ARANESP) injection - DIALYSIS  150 mcg Intravenous Q Fri-HD   dextrose  1 Tube Oral STAT   levothyroxine  50 mcg Oral QAC breakfast   metoprolol succinate  50 mg Oral QHS   pantoprazole (PROTONIX) IV  40 mg Intravenous Q12H   sevelamer carbonate  800 mg Oral TID WC   sodium hypochlorite  1 application Irrigation Daily   torsemide  150 mg Oral Daily   valACYclovir  500 mg Oral Q24H   Continuous Infusions:  sodium chloride     sodium chloride     desmopressin (DDAVP) IV for Bleeding     [START ON 05/15/2021] sodium thiosulfate infusion for calciphylaxis      LOS: 2 days   Time spent: 32 mins   Aubert Choyce JoWynetta EmeryMD How to contact the TRSt. Luke'S Rehabilitation Institutettending or Consulting provider 7AScotiar covering provider during after hours 7PMammothfor this patient?  Check the care team in [name] look for a) attending/consulting TRH provider listed and b) the TRThree Rivers Medical Centeream listed Log into [email or web address] and use [place]'s universal password to access. If you do not have the password, please contact the hospital operator. Locate the TRCenter For Endoscopy LLCrovider you are looking for under [facility] and page to a number that you can be directly reached. If you still have difficulty reaching the provider, please page the DOAdvanced Medical Imaging Surgery CenterDirector on  Call) for the Hospitalists listed on amion for assistance.  05/14/2021, 4:37 PM

## 2021-05-14 NOTE — Progress Notes (Signed)
Hypoglycemic Event  CBG: 61 recheck 57   Treatment: oral intake of soda  Symptoms: none   Follow-up CBG: Time: CBG Result: 76  Possible Reasons for Event: NPO   Comments/MD notified: Dr. Wynetta Emery resume diet     Blakely Gluth, [place]

## 2021-05-14 NOTE — Progress Notes (Signed)
Spoke with patient with RN at bedside. Just completed enema with bloody output, no solid stool or stool pieces. Hgb 7.7. Has received 2 units of PRBCs (6/22 and 6/23). Dialysis completed yesterday. Hyperkalemia resolved. NPO except sips with meds.   Diagnostic colonoscopy planned for today. Risks and benefits again discussed with patient with stated understanding. Due to shingles, she is on airborne precautions.    Annitta Needs, PhD, ANP-BC [facility]

## 2021-05-14 NOTE — Transfer of Care (Signed)
Immediate Anesthesia Transfer of Care Note  Patient: [name], clinical  Procedure(s) Performed: COLONOSCOPY WITH PROPOFOL POLYPECTOMY HEMOSTASIS CLIP PLACEMENT  Patient Location: PACU  Anesthesia Type:General  Level of Consciousness: awake, alert , oriented and patient cooperative  Airway & Oxygen Therapy: Patient Spontanous Breathing  Post-op Assessment: Report given to RN, Post -op Vital signs reviewed and stable and Patient moving all extremities  Post vital signs: Reviewed and stable  Last Vitals:  Vitals Value Taken Time  BP    Temp    Pulse    Resp    SpO2      Last Pain:  Vitals:   05/13/21 2055  TempSrc:   PainSc: 0-No pain         Complications: No notable events documented.

## 2021-05-14 NOTE — Progress Notes (Signed)
[facility] Progress Note  Name: Amy Moses MRN: [id number] DOB: 1971/05/16  Chief Complaint:  Rectal bleeding  Subjective:  She had HD on 6/23 with 300 mL positive (no UF).  She got a unit of blood.  She is about to go down for a colonoscopy.  States she did have some blood with bowel prep  Review of systems:  Denies shortness of breath or chest pain  Denies n/v --------  Background on consult:  Amy Moses is a 50 y.o. female with a history of ESRD on hemodialysis TTS at [name], DM, HTN, and hypothyroidism who presented to the hospital with rectal bleeding.  She also complained of feeling weak and dizzy and had some nausea.  Nephrology is consulted for assistance with management of end-stage renal disease on hemodialysis.  Note that she has been treated for calciphylaxis and [facility]  She has recently had left chest shingles as well per charting.  She states this has just been going on a few days.  She hasn't had HD since Saturday - was going to go yesterday to make up then this started and she went to the ER; didn't think she could sit through treatment.  She states has had calciphylasix since august 2021.   Intake/Output Summary (Last 24 hours) at 05/14/2021 1017 Last data filed at 05/14/2021 0700 Gross per 24 hour  Intake 870 ml  Output -310 ml  Net 1180 ml    Vitals:  Vitals:   05/13/21 1700 05/13/21 1715 05/13/21 2055 05/14/21 0607  BP: 110/62 124/70 135/68 114/66  Pulse: 70 68 73 63  Resp:   18 18  Temp:  97.8 F (36.6 C) 97.8 F (36.6 C)   TempSrc:  Oral    SpO2:   100% 100%  Weight:      Height:         Physical Exam:  General adult female in bed in no acute distress HEENT normocephalic atraumatic extraocular movements intact sclera anicteric Neck supple trachea midline Lungs clear to auscultation bilaterally normal work of breathing at rest  Heart S1S2 no rub Abdomen soft nontender  nondistended Extremities no pitting edema. Multiple wounds are bandaged - bandages clean Psych normal mood and affect Neuro - alert and oriented x 3 provides hx and follows commands Access: LUE AVF bruit and thrill   Medications reviewed   Labs:  BMP Latest Ref Rng & Units 05/14/2021 05/13/2021 05/12/2021  Glucose 70 - 99 mg/dL 97 234(H) 170(H)  BUN 6 - 20 mg/dL 26(H) 60(H) 55(H)  Creatinine 0.44 - 1.00 mg/dL 3.33(H) 6.32(H) 6.51(H)  Sodium 135 - 145 mmol/L 132(L) 134(L) 138  Potassium 3.5 - 5.1 mmol/L 4.9 6.9(HH) 5.1  Chloride 98 - 111 mmol/L 95(L) 100 100  CO2 22 - 32 mmol/L 29 17(L) 21(L)  Calcium 8.9 - 10.3 mg/dL 8.2(L) 8.3(L) 9.2   Outpatient HD orders:  Everrett Coombe [phone]   EDW 87.5 kg TTS BF 400 DF 800 AVF LUE 2K/2.5 Ca Meds - sodium thiosulfate 25 gram every tx; venofer 50 mg weekly ESA: epogen 20,000 units each tx Vit D: not on calcitriol or hectorol ; not on sensipar   Last post weight was 87.5 kg on 6/18  Assessment/Plan:   # hyperkalemia - 2/2 ESRD and missing HD - resolved with HD   # ESRD - HD per TTS schedule - next HD on 6/25   # GI bleed - per Primary and GI   # HTN -  Controlled   # Anemia - CKD and acute blood loss - transfusion per primary team - on ESA outpatient  - Start aranesp 150 mcg weekly - first dose today   # Metabolic bone disease - not on activated vit D (has calciphylaxis) or sensipar   # Reported shingles outbreak - per primary team; pt is on contact precautions   # Calciphylaxis - on sodium thiosulfate with HD   Nephrology will follow labs over the weekend.  Please contact us if needed.  HD is for tomorrow  Claudia Desanctis, MD 05/14/2021 10:17 AM

## 2021-05-14 NOTE — Progress Notes (Signed)
Inpatient Diabetes Program Recommendations  AACE/ADA: New Consensus Statement on Inpatient Glycemic Control   Target Ranges:  Prepandial:   less than 140 mg/dL      Peak postprandial:   less than 180 mg/dL (1-2 hours)      Critically ill patients:  140 - 180 mg/dL   Results for BOBBETTE, UFFORD (MRN [id number]) as of 05/14/2021 08:01  Ref. Range 05/13/2021 07:47 05/13/2021 11:14 05/13/2021 16:22 05/13/2021 17:29 05/13/2021 18:13 05/13/2021 20:56 05/14/2021 06:09 05/14/2021 07:44  Glucose-Capillary Latest Ref Range: 70 - 99 mg/dL 147 (H) 131 (H) 65 (L) 57 (L) 94 127 (H) 100 (H) 102 (H)    Review of Glycemic Control  Diabetes history: DM2 Outpatient Diabetes medications: Lantus 50 units QPM, Humalog 5-15 units TID with meals Current orders for Inpatient glycemic control: Lantus 15 units QHS  Inpatient Diabetes Program Recommendations:    Insulin: Per chart, patient refused Lantus last night. Please consider decreasing Lantus to 5 units QHS and ordering Novolog 0-6 units TID with meals and Novolog 0-5 units QHS.  Thanks, Barnie Alderman, RN, MSN, CDE Diabetes Coordinator Inpatient Diabetes Program [phone] (Team Pager from 8am to 5pm)

## 2021-05-14 NOTE — Anesthesia Postprocedure Evaluation (Signed)
Anesthesia Post Note  Patient: [name], clinical  Procedure(s) Performed: COLONOSCOPY WITH PROPOFOL POLYPECTOMY HEMOSTASIS CLIP PLACEMENT  Patient location during evaluation: PACU Anesthesia Type: General Level of consciousness: awake and alert, oriented and sedated Pain management: pain level controlled Vital Signs Assessment: post-procedure vital signs reviewed and stable Respiratory status: spontaneous breathing and respiratory function stable Cardiovascular status: blood pressure returned to baseline and stable Postop Assessment: no apparent nausea or vomiting Anesthetic complications: no   No notable events documented.   Last Vitals:  Vitals:   05/14/21 0607 05/14/21 1130  BP: 114/66 (!) 94/54  Pulse: 63 (!) 59  Resp: 18   Temp:    SpO2: 100% 99%    Last Pain:  Vitals:   05/13/21 2055  TempSrc:   PainSc: 0-No pain                 Evelyn Moch C Vennie Waymire

## 2021-05-15 DIAGNOSIS — B029 Zoster without complications: Secondary | ICD-10-CM

## 2021-05-15 LAB — GLUCOSE, CAPILLARY
Glucose-Capillary: 43 mg/dL — CL (ref 70–99)
Glucose-Capillary: 45 mg/dL — ABNORMAL LOW (ref 70–99)
Glucose-Capillary: 113 mg/dL — ABNORMAL HIGH (ref 70–99)
Glucose-Capillary: 43 mg/dL — CL (ref 70–99)
Glucose-Capillary: 61 mg/dL — ABNORMAL LOW (ref 70–99)
Glucose-Capillary: 149 mg/dL — ABNORMAL HIGH (ref 70–99)
Glucose-Capillary: 156 mg/dL — ABNORMAL HIGH (ref 70–99)
Glucose-Capillary: 170 mg/dL — ABNORMAL HIGH (ref 70–99)
Glucose-Capillary: 170 mg/dL — ABNORMAL HIGH (ref 70–99)

## 2021-05-15 LAB — CBC
HCT: 25.5 % — ABNORMAL LOW (ref 36.0–46.0)
Hemoglobin: 7.9 g/dL — ABNORMAL LOW (ref 12.0–15.0)
MCH: 27.5 pg (ref 26.0–34.0)
MCHC: 31 g/dL (ref 30.0–36.0)
MCV: 88.9 fL (ref 80.0–100.0)
Platelets: 259 10*3/uL (ref 150–400)
RBC: 2.87 MIL/uL — ABNORMAL LOW (ref 3.87–5.11)
RDW: 15.8 % — ABNORMAL HIGH (ref 11.5–15.5)
WBC: 7.8 10*3/uL (ref 4.0–10.5)
nRBC: 0 % (ref 0.0–0.2)

## 2021-05-15 LAB — RENAL FUNCTION PANEL
Albumin: 2.4 g/dL — ABNORMAL LOW (ref 3.5–5.0)
Anion gap: 11 (ref 5–15)
BUN: 32 mg/dL — ABNORMAL HIGH (ref 6–20)
CO2: 27 mmol/L (ref 22–32)
Calcium: 8.7 mg/dL — ABNORMAL LOW (ref 8.9–10.3)
Chloride: 95 mmol/L — ABNORMAL LOW (ref 98–111)
Creatinine, Ser: 4.43 mg/dL — ABNORMAL HIGH (ref 0.44–1.00)
GFR, Estimated: 12 mL/min — ABNORMAL LOW (ref >60)
Glucose, Bld: 135 mg/dL — ABNORMAL HIGH (ref 70–99)
Phosphorus: 6.6 mg/dL — ABNORMAL HIGH (ref 2.5–4.6)
Potassium: 5.1 mmol/L (ref 3.5–5.1)
Sodium: 133 mmol/L — ABNORMAL LOW (ref 135–145)

## 2021-05-15 MED ORDER — CAMPHOR-MENTHOL 0.5-0.5 % EX LOTN
TOPICAL_LOTION | CUTANEOUS | Status: DC | PRN
  Filled 2021-05-15: qty 222

## 2021-05-15 MED ORDER — INSULIN GLARGINE 100 UNIT/ML ~~LOC~~ SOLN: 5.0000 [IU] | Freq: Every day | SUBCUTANEOUS | 11 refills | Status: DC

## 2021-05-15 MED ORDER — MUPIROCIN 2 % EX OINT: 1.0000 "application " | TOPICAL_OINTMENT | Freq: Two times a day (BID) | CUTANEOUS | Status: DC

## 2021-05-15 MED ORDER — HUMALOG KWIKPEN 100 UNIT/ML ~~LOC~~ SOPN
2.0000 [IU] | PEN_INJECTOR | Freq: Three times a day (TID) | SUBCUTANEOUS | 11 refills | Status: DC
Start: 2021-05-15 — End: 2022-03-16

## 2021-05-15 MED ORDER — ASPIRIN EC 81 MG PO TBEC: 81.0000 mg | DELAYED_RELEASE_TABLET | Freq: Every day | ORAL | 2 refills | Status: AC

## 2021-05-15 NOTE — TOC Transition Note (Signed)
Transition of Care [facility]) - CM/SW Discharge Note   Patient Details  Name: Amy Moses MRN: [id number] Date of Birth: Oct 07, 1971  Transition of Care [name]) CM/SW Contact:  Natasha Bence, LCSW Phone Number: 05/15/2021, 5:01 PM   Clinical Narrative:    CSW notified of patient's readiness for discharge. [facility] reported that they will not continue [name] services. CSW contacted [facility] and [facility]. [facility] answering service reported that they will contact CSW with follow up. CSW received no follow up call. [facility] not able to accept referral on weekend. Patient receives OP wound care with [facility] CSW notified patient's daughter. Patient's daughter requested dressing changed before d/c. CSW notified RN and MD. TOC signing off.    Final next level of care: Other (comment) (Out patient wound care) Barriers to Discharge: Barriers Resolved   Patient Goals and CMS Choice Patient states their goals for this hospitalization and ongoing recovery are:: OP CMS Medicare.gov Compare Post Acute Care list provided to:: Patient Choice offered to / list presented to : Patient  Discharge Placement                    Patient and family notified of of transfer: 05/15/21  Discharge Plan and Services     Post Acute Care Choice: Home Health                    HH Arranged: RN [facility] Agency: [facility]        Social Determinants of Health (SDOH) Interventions     Readmission Risk Interventions Readmission Risk Prevention Plan 05/14/2021  Transportation Screening Complete  Medication Review Press photographer) Complete  HRI or [place] Complete  SW Recovery Care/Counseling Consult Complete  Palliative Care Screening Not [place] Not Applicable  Some recent data might be hidden

## 2021-05-15 NOTE — Progress Notes (Signed)
Patient discharged home via wheelchair in stable condition.

## 2021-05-15 NOTE — Discharge Instructions (Addendum)
No future MRI until clips no longer present Follow up with GI regarding pathology results. Resume regular hemodialysis schedule.  Stop taking valtrex on 05/19/21.     IMPORTANT INFORMATION: PAY CLOSE ATTENTION   PHYSICIAN DISCHARGE INSTRUCTIONS  Follow with Primary care provider  Leeanne Rio, MD  and other consultants as instructed by your Hospitalist Physician  [place] IF SYMPTOMS COME BACK, WORSEN OR NEW PROBLEM DEVELOPS   Please note: You were cared for by a hospitalist during your hospital stay. Every effort will be made to forward records to your primary care provider.  You can request that your primary care provider send for your hospital records if they have not received them.  Once you are discharged, your primary care physician will handle any further medical issues. Please note that NO REFILLS for any discharge medications will be authorized once you are discharged, as it is imperative that you return to your primary care physician (or establish a relationship with a primary care physician if you do not have one) for your post hospital discharge needs so that they can reassess your need for medications and monitor your lab values.  Please get a complete blood count and chemistry panel checked by your Primary MD at your next visit, and again as instructed by your Primary MD.  Get Medicines reviewed and adjusted: Please take all your medications with you for your next visit with your Primary MD  Laboratory/radiological data: Please request your Primary MD to go over all hospital tests and procedure/radiological results at the follow up, please ask your primary care provider to get all Hospital records sent to his/her office.  In some cases, they will be blood work, cultures and biopsy results pending at the time of your discharge. Please request that your primary care provider follow up on these results.  If you are diabetic, please bring  your blood sugar readings with you to your follow up appointment with primary care.    Please call and make your follow up appointments as soon as possible.    Also Note the following: If you experience worsening of your admission symptoms, develop shortness of breath, life threatening emergency, suicidal or homicidal thoughts you must seek medical attention immediately by calling 911 or calling your MD immediately  if symptoms less severe.  You must read complete instructions/literature along with all the possible adverse reactions/side effects for all the Medicines you take and that have been prescribed to you. Take any new Medicines after you have completely understood and accpet all the possible adverse reactions/side effects.   Do not drive when taking Pain medications or sleeping medications (Benzodiazepines)  Do not take more than prescribed Pain, Sleep and Anxiety Medications. It is not advisable to combine anxiety,sleep and pain medications without talking with your primary care practitioner  Special Instructions: If you have smoked or chewed Tobacco  in the last 2 yrs please stop smoking, stop any regular Alcohol  and or any Recreational drug use.  Wear Seat belts while driving.  Do not drive if taking any narcotic, mind altering or controlled substances or recreational drugs or alcohol.

## 2021-05-15 NOTE — Progress Notes (Signed)
Patient looks good this morning.  No further rectal bleeding.  Tolerating diet.  Seen with Dr. Wynetta Emery.  Hemoglobin stable at 7.9  Vital signs in last 24 hours: Temp:  [97.5 F (36.4 C)-98.2 F (36.8 C)] 98.2 F (36.8 C) (06/25 [id number]) Pulse Rate:  [59-77] 77 (06/25 0614) Resp:  [18] 18 (06/25 0614) BP: (94-128)/(54-69) 116/62 (06/25 0614) SpO2:  [99 %-100 %] 99 % (06/25 0614) Last BM Date: 05/14/21 General:   Alert,   pleasant and cooperative in NAD Abdomen: Abdomen soft and nontender extremities:      Intake/Output from previous day: 06/24 0701 - 06/25 0700 In: 225 [I.V.:225] Out: -  Intake/Output this shift: No intake/output data recorded.  Lab Results: Recent Labs    05/13/21 0814 05/13/21 1219 05/13/21 1903 05/14/21 0613 05/15/21 0629  WBC 8.8  --   --  7.4 7.8  HGB 7.6*   < > 7.6* 7.7* 7.9*  HCT 24.8*   < > 23.7* 24.8* 25.5*  PLT 278  --   --  217 259   < > = values in this interval not displayed.   BMET Recent Labs    05/13/21 0455 05/14/21 0613 05/15/21 0629  NA 134* 132* 133*  K 6.9* 4.9 5.1  CL 100 95* 95*  CO2 17* 29 27  GLUCOSE 234* 97 135*  BUN 60* 26* 32*  CREATININE 6.32* 3.33* 4.43*  CALCIUM 8.3* 8.2* 8.7*   LFT Recent Labs    05/13/21 0455 05/14/21 0613 05/15/21 0629  PROT 5.0*  --   --   ALBUMIN 2.3*   < > 2.4*  AST 10*  --   --   ALT 9  --   --   ALKPHOS 94  --   --   BILITOT 0.8  --   --    < > = values in this interval not displayed.     Impression:   Pleasant 50 year old lady with multiple comorbidities including end-stage renal disease on hemodialysis admitted with hematochezia following removal of a large polyp from her colon.  Colonoscopy yesterday demonstrated stigmata of post polypectomy bleeding.  Hemostasis clips placed. Bleeding seems to have ceased.   Recommendations:  Follow-up on pathology from polyp removed yesterday  No future MRI until clips no longer present  As discussed with Dr. Wynetta Emery, from a GI  standpoint patient could be discharged later today.

## 2021-05-15 NOTE — Discharge Summary (Signed)
Physician Discharge Summary  Amy Moses [id number] DOB: May 30, 1971 DOA: 05/12/2021  PCP: Leeanne Rio, MD  Admit date: 05/12/2021 Discharge date: 05/15/2021  Admitted From: Disposition:   Recommendations for Outpatient Follow-up:  Follow up with PCP in 3-5 days for recheck Please obtain CBC in 1 week to follow Hg  Please follow up with [facility] as scheduled 6/29 with Dr. Abbey Chatters Please follow up with [facility] wound care clinic on 7/1 as scheduled  No future MRI until clips no longer present Follow up with GI regarding pathology results. Resume regular hemodialysis schedule.  Stop taking valtrex on 05/19/21.   Home Health: resumption orders  Discharge Condition: STABLE   CODE STATUS: FULL DIET: renal / carb modified   Brief Hospitalization Summary: Please see all hospital notes, images, labs for full details of the hospitalization. ADMISSION HPI:  Amy Moses  is a 50 y.o. female, with medical history significant of Anemia, Blind R eye, DM, HLD, HTN, hypothyroidism, ESRD on dialysis. Dialysis T/TH/Sat, patient was just discharged yesterday due to lower GI bleed, patient with recent hospitalization 6/3 for GI bleed, s/p endoscopy/colonoscopy, significant for gastritis, duodenal erosions and polyp with bleeding stigmata, status post polypectomy, patient was readmitted on 6/20 for  another episode of bright red blood per rectum, but this has resolved without intervention, and her hemoglobin has been stable, she so she was discharged 6/21, patient reports significant amount of bright red blood per rectum overnight, as well she does report dizziness, lightheadedness, and some shortness of breath, which prompted her to come to the ED, of note last hemodialysis was on Saturday, and patient missed her hemodialysis today, but reports she is making urine, she denies any abdominal pain, loose stools, chest pain, palpitation. -In ED her hemoglobin was noted to be low at 7.2, down from  9.5 upon discharge yesterday afternoon, she had Hemoccult positive stool, she had significant hematochezia in the commode, her potassium was 5.1, BUN was 55, [facility] hospitalist consulted to admit.   HOSPITAL COURSE BY PROBLEM  Recurrent lower GI bleeding - acute blood loss anemia. S/p 2 unit PRBC. Appreciate GI consult with plans for repeat colonoscopy.  Pt had colonoscopy 6/23 and it is thought she is having post polypectomy bleeding.  her Hg is holding stable now and GI has seen her and she can discharge home with outpatient follow up with GI for pathology results and recheck. Pt tolerating diet.  Pt is to have NO MRI until clips have passed completely.  Pt has follow up with Dr. Abbey Chatters on 6/30.     ESRD on HD - nephrology team consulted and planning for HD treatment today.  pt to discharge home after hemodialysis completed today.  Resume regular outpatient dialysis schedule.     Calciphylaxis - Pt is followed by [facility] in [facility].  Wound care RN has evaluated and orders placed.  Pt to follow up on 05/21/21.    Shingles Zoster - Pt is completing a 7 day course of valtrex 500 mg daily.  Lesions are healing and now pruritic (Pt advised not to scratch). Contact precautions.  PT TO STOP VALTREX ON 05/19/21.    Blindness - assist with feeding order placed.   Hyperlipidemia - resume home statin therapy.     Hypothyroidism - stable on  home regimen   DVT prophylaxis: SCD Code Status: Full Family Communication: plan of care discussed with patient who verbalized understanding Disposition: home Status is: Inpatient  Discharge Diagnoses:  Active Problems:   Uncontrolled type 2  diabetes mellitus with chronic kidney disease, with long-term current use of insulin (HCC)   Hyperlipidemia   Primary hypothyroidism   Calciphylaxis   Acute blood loss anemia   Blindness   Shingles   Lower GI bleed   Discharge Instructions: Discharge Instructions     AMB referral to wound care center   Complete by: As  directed    Amb Referral to Palliative Care   Complete by: As directed       Allergies as of 05/15/2021       Reactions   Ace Inhibitors Cough        Medication List     TAKE these medications    acetaminophen 500 MG tablet Commonly known as: TYLENOL Take 1,000 mg by mouth every 6 (six) hours as needed for moderate pain.   aspirin EC 81 MG tablet Take 1 tablet (81 mg total) by mouth daily with breakfast. Start taking on: May 19, 2021 What changed: These instructions start on May 19, 2021. If you are unsure what to do until then, ask your doctor or other care provider.   atorvastatin 10 MG tablet Commonly known as: LIPITOR Take 1 tablet (10 mg total) by mouth daily.   HumaLOG KwikPen 100 UNIT/ML KwikPen Generic drug: insulin lispro Inject 2 Units into the skin 3 (three) times daily with meals. If eats 50% or more of meal. What changed:  how much to take additional instructions   hydrocortisone 2.5 % rectal cream Commonly known as: Anusol-HC Place 1 application rectally 2 (two) times daily for 7 days.   insulin glargine 100 UNIT/ML injection Commonly known as: LANTUS Inject 0.05 mLs (5 Units total) into the skin at bedtime. Start taking on: May 17, 2021 What changed:  how much to take These instructions start on May 17, 2021. If you are unsure what to do until then, ask your doctor or other care provider.   levothyroxine 50 MCG tablet Commonly known as: SYNTHROID Take 1 tablet (50 mcg total) by mouth daily before breakfast.   metoprolol succinate 50 MG 24 hr tablet Commonly known as: TOPROL-XL Take 1 tablet (50 mg total) by mouth at bedtime. Take with or immediately following a meal.   pantoprazole 40 MG tablet Commonly known as: Protonix Take 1 tablet (40 mg total) by mouth 2 (two) times daily.   polyethylene glycol 17 g packet Commonly known as: MiraLax Take 17 g by mouth daily as needed for mild constipation.   sevelamer carbonate 800 MG  tablet Commonly known as: RENVELA Take 1 tablet (800 mg total) by mouth 3 (three) times daily with meals.   torsemide 100 MG tablet Commonly known as: DEMADEX Take 150 mg by mouth daily.   valACYclovir 500 MG tablet Commonly known as: VALTREX Take 1 tablet (500 mg total) by mouth daily for 7 days.   Vitamin D (Ergocalciferol) 1.25 MG (50000 UNIT) Caps capsule Commonly known as: DRISDOL Take 1 capsule by mouth every 14 (fourteen) days.        Follow-up Information     Leeanne Rio, MD Follow up.   Specialty: Family Medicine Why: Hospital Follow Up Contact information: [place] [address] [phone]         Eloise Harman, DO. Schedule an appointment as soon as possible for a visit in 2 week(s).   Specialty: Gastroenterology Why: Hospital Follow Up and biopsy results follow up Contact information: [address] [place] [address] [phone]  Allergies  Allergen Reactions   Ace Inhibitors Cough   Allergies as of 05/15/2021       Reactions   Ace Inhibitors Cough        Medication List     TAKE these medications    acetaminophen 500 MG tablet Commonly known as: TYLENOL Take 1,000 mg by mouth every 6 (six) hours as needed for moderate pain.   aspirin EC 81 MG tablet Take 1 tablet (81 mg total) by mouth daily with breakfast. Start taking on: May 19, 2021 What changed: These instructions start on May 19, 2021. If you are unsure what to do until then, ask your doctor or other care provider.   atorvastatin 10 MG tablet Commonly known as: LIPITOR Take 1 tablet (10 mg total) by mouth daily.   HumaLOG KwikPen 100 UNIT/ML KwikPen Generic drug: insulin lispro Inject 2 Units into the skin 3 (three) times daily with meals. If eats 50% or more of meal. What changed:  how much to take additional instructions   hydrocortisone 2.5 % rectal cream Commonly known as: Anusol-HC Place 1 application rectally 2  (two) times daily for 7 days.   insulin glargine 100 UNIT/ML injection Commonly known as: LANTUS Inject 0.05 mLs (5 Units total) into the skin at bedtime. Start taking on: May 17, 2021 What changed:  how much to take These instructions start on May 17, 2021. If you are unsure what to do until then, ask your doctor or other care provider.   levothyroxine 50 MCG tablet Commonly known as: SYNTHROID Take 1 tablet (50 mcg total) by mouth daily before breakfast.   metoprolol succinate 50 MG 24 hr tablet Commonly known as: TOPROL-XL Take 1 tablet (50 mg total) by mouth at bedtime. Take with or immediately following a meal.   pantoprazole 40 MG tablet Commonly known as: Protonix Take 1 tablet (40 mg total) by mouth 2 (two) times daily.   polyethylene glycol 17 g packet Commonly known as: MiraLax Take 17 g by mouth daily as needed for mild constipation.   sevelamer carbonate 800 MG tablet Commonly known as: RENVELA Take 1 tablet (800 mg total) by mouth 3 (three) times daily with meals.   torsemide 100 MG tablet Commonly known as: DEMADEX Take 150 mg by mouth daily.   valACYclovir 500 MG tablet Commonly known as: VALTREX Take 1 tablet (500 mg total) by mouth daily for 7 days.   Vitamin D (Ergocalciferol) 1.25 MG (50000 UNIT) Caps capsule Commonly known as: DRISDOL Take 1 capsule by mouth every 14 (fourteen) days.        Procedures/Studies: NM GI Blood Loss  Result Date: 05/13/2021 CLINICAL DATA:  Hematochezia EXAM: NUCLEAR MEDICINE GASTROINTESTINAL BLEEDING SCAN TECHNIQUE: Sequential abdominal images were obtained following intravenous administration of Tc-55mlabeled red blood cells. RADIOPHARMACEUTICALS:  24.5 mCi Tc-926mertechnetate in-vitro labeled red cells. COMPARISON:  None. FINDINGS: Normal distribution of radiotracer within the blood pool, liver, and spleen. No evidence of active gastrointestinal hemorrhage. IMPRESSION: No evidence of active gastrointestinal  hemorrhage. Electronically Signed   By: AsFidela SalisburyD   On: 05/13/2021 00:03     Subjective:   Discharge Exam: Vitals:   05/14/21 2137 05/15/21 0614  BP: (!) 105/59 116/62  Pulse: 73 77  Resp: 18 18  Temp: 97.8 F (36.6 C) 98.2 F (36.8 C)  SpO2: 100% 99%   Vitals:   05/14/21 1317 05/14/21 1409 05/14/21 2137 05/15/21 0614  BP: 128/69  (!) 105/59 116/62  Pulse: 63  73  77  Resp: '18  18 18  '$ Temp:  (!) 97.5 F (36.4 C) 97.8 F (36.6 C) 98.2 F (36.8 C)  TempSrc:  Oral Oral Oral  SpO2: 100%  100% 99%  Weight:      Height:       General: Pt is alert, awake, not in acute distress Cardiovascular: normal S1/S2 +, no rubs, no gallops Respiratory: CTA bilaterally, no wheezing, no rhonchi Abdominal: Soft, NT, ND, bowel sounds + Extremities: no edema, no cyanosis   The results of significant diagnostics from this hospitalization (including imaging, microbiology, ancillary and laboratory) are listed below for reference.     Microbiology: Recent Results (from the past 240 hour(s))  Resp Panel by RT-PCR (Flu A&B, Covid) Nasopharyngeal Swab     Status: None   Collection Time: 05/10/21  7:49 PM   Specimen: Nasopharyngeal Swab; Nasopharyngeal(NP) swabs in vial transport medium  Result Value Ref Range Status   SARS Coronavirus 2 by RT PCR NEGATIVE NEGATIVE Final    Comment: (NOTE) SARS-CoV-2 target nucleic acids are NOT DETECTED.  The SARS-CoV-2 RNA is generally detectable in upper respiratory specimens during the acute phase of infection. The lowest concentration of SARS-CoV-2 viral copies this assay can detect is 138 copies/mL. A negative result does not preclude SARS-Cov-2 infection and should not be used as the sole basis for treatment or other patient management decisions. A negative result may occur with  improper specimen collection/handling, submission of specimen other than nasopharyngeal swab, presence of viral mutation(s) within the areas targeted by this assay,  and inadequate number of viral copies(<138 copies/mL). A negative result must be combined with clinical observations, patient history, and epidemiological information. The expected result is Negative.  Fact Sheet for Patients:  EntrepreneurPulse.com.au  Fact Sheet for Healthcare Providers:  IncredibleEmployment.be  This test is no t yet approved or cleared by the [place] FDA and  has been authorized for detection and/or diagnosis of SARS-CoV-2 by FDA under an Emergency Use Authorization (EUA). This EUA will remain  in effect (meaning this test can be used) for the duration of the COVID-19 declaration under Section 564(b)(1) of the Act, 21 U.S.C.section 360bbb-3(b)(1), unless the authorization is terminated  or revoked sooner.       Influenza A by PCR NEGATIVE NEGATIVE Final   Influenza B by PCR NEGATIVE NEGATIVE Final    Comment: (NOTE) The Xpert Xpress SARS-CoV-2/FLU/RSV plus assay is intended as an aid in the diagnosis of influenza from Nasopharyngeal swab specimens and should not be used as a sole basis for treatment. Nasal washings and aspirates are unacceptable for Xpert Xpress SARS-CoV-2/FLU/RSV testing.  Fact Sheet for Patients: EntrepreneurPulse.com.au  Fact Sheet for Healthcare Providers: IncredibleEmployment.be  This test is not yet approved or cleared by the [place] FDA and has been authorized for detection and/or diagnosis of SARS-CoV-2 by FDA under an Emergency Use Authorization (EUA). This EUA will remain in effect (meaning this test can be used) for the duration of the COVID-19 declaration under Section 564(b)(1) of the Act, 21 U.S.C. section 360bbb-3(b)(1), unless the authorization is terminated or revoked.  Performed at [facility], [address], [place], [place] [address]   Resp Panel by RT-PCR (Flu A&B, Covid) Nasopharyngeal Swab     Status: None   Collection Time:  05/12/21  5:30 PM   Specimen: Nasopharyngeal Swab; Nasopharyngeal(NP) swabs in vial transport medium  Result Value Ref Range Status   SARS Coronavirus 2 by RT PCR NEGATIVE NEGATIVE Final    Comment: (NOTE)  SARS-CoV-2 target nucleic acids are NOT DETECTED.  The SARS-CoV-2 RNA is generally detectable in upper respiratory specimens during the acute phase of infection. The lowest concentration of SARS-CoV-2 viral copies this assay can detect is 138 copies/mL. A negative result does not preclude SARS-Cov-2 infection and should not be used as the sole basis for treatment or other patient management decisions. A negative result may occur with  improper specimen collection/handling, submission of specimen other than nasopharyngeal swab, presence of viral mutation(s) within the areas targeted by this assay, and inadequate number of viral copies(<138 copies/mL). A negative result must be combined with clinical observations, patient history, and epidemiological information. The expected result is Negative.  Fact Sheet for Patients:  EntrepreneurPulse.com.au  Fact Sheet for Healthcare Providers:  IncredibleEmployment.be  This test is no t yet approved or cleared by the [place] FDA and  has been authorized for detection and/or diagnosis of SARS-CoV-2 by FDA under an Emergency Use Authorization (EUA). This EUA will remain  in effect (meaning this test can be used) for the duration of the COVID-19 declaration under Section 564(b)(1) of the Act, 21 U.S.C.section 360bbb-3(b)(1), unless the authorization is terminated  or revoked sooner.       Influenza A by PCR NEGATIVE NEGATIVE Final   Influenza B by PCR NEGATIVE NEGATIVE Final    Comment: (NOTE) The Xpert Xpress SARS-CoV-2/FLU/RSV plus assay is intended as an aid in the diagnosis of influenza from Nasopharyngeal swab specimens and should not be used as a sole basis for treatment. Nasal washings  and aspirates are unacceptable for Xpert Xpress SARS-CoV-2/FLU/RSV testing.  Fact Sheet for Patients: EntrepreneurPulse.com.au  Fact Sheet for Healthcare Providers: IncredibleEmployment.be  This test is not yet approved or cleared by the [place] FDA and has been authorized for detection and/or diagnosis of SARS-CoV-2 by FDA under an Emergency Use Authorization (EUA). This EUA will remain in effect (meaning this test can be used) for the duration of the COVID-19 declaration under Section 564(b)(1) of the Act, 21 U.S.C. section 360bbb-3(b)(1), unless the authorization is terminated or revoked.  Performed at [facility], [address] [place], [place] [address]   MRSA Next Gen by PCR, Nasal     Status: Abnormal   Collection Time: 05/14/21  7:50 PM   Specimen: Nasal Mucosa; Nasal Swab  Result Value Ref Range Status   MRSA by PCR Next Gen DETECTED (A) NOT DETECTED Final    Comment: CRITICAL RESULT CALLED TO, READ BACK BY AND VERIFIED WITH: WATLINGTON,J 2125 05/14/2021 COLEMAN,R (NOTE) The GeneXpert MRSA Assay (FDA approved for NASAL specimens only), is one component of a comprehensive MRSA colonization surveillance program. It is not intended to diagnose MRSA infection nor to guide or monitor treatment for MRSA infections. Test performance is not FDA approved in patients less than 27 years old. Performed at [facility], [address] [place], [place] [address]      Labs: BNP (last 3 results) Recent Labs    03/30/21 1658  BNP 123456*   Basic Metabolic Panel: Recent Labs  Lab 05/11/21 0512 05/12/21 1527 05/13/21 0455 05/14/21 0613 05/15/21 0629  NA 137 138 134* 132* 133*  K 4.3 5.1 6.9* 4.9 5.1  CL 97* 100 100 95* 95*  CO2 25 21* 17* 29 27  GLUCOSE 191* 170* 234* 97 135*  BUN 45* 55* 60* 26* 32*  CREATININE 5.37* 6.51* 6.32* 3.33* 4.43*  CALCIUM 9.3 9.2 8.3* 8.2* 8.7*  MG 2.8*  --   --  2.1  --  PHOS 5.6*  --   --   4.8* 6.6*   Liver Function Tests: Recent Labs  Lab 05/11/21 0512 05/12/21 1527 05/13/21 0455 05/14/21 0613 05/15/21 0629  AST 8* 9* 10*  --   --   ALT '8 8 9  '$ --   --   ALKPHOS 111 108 94  --   --   BILITOT 0.7 0.4 0.8  --   --   PROT 5.6* 5.7* 5.0*  --   --   ALBUMIN 2.6* 2.7* 2.3* 2.3* 2.4*   No results for input(s): LIPASE, AMYLASE in the last 168 hours. No results for input(s): AMMONIA in the last 168 hours. CBC: Recent Labs  Lab 05/10/21 1607 05/11/21 0135 05/12/21 1527 05/12/21 2353 05/13/21 0814 05/13/21 1219 05/13/21 1903 05/14/21 0613 05/15/21 0629  WBC 7.3   < > 8.4 8.3 8.8  --   --  7.4 7.8  NEUTROABS 5.2  --  5.9  --   --   --   --   --   --   HGB 10.1*   < > 7.2* 8.5* 7.6* 7.2* 7.6* 7.7* 7.9*  HCT 33.3*   < > 23.9* 27.5* 24.8* 23.5* 23.7* 24.8* 25.5*  MCV 87.9   < > 88.2 87.9 88.3  --   --  87.0 88.9  PLT 318   < > 328 279 278  --   --  217 259   < > = values in this interval not displayed.   Cardiac Enzymes: No results for input(s): CKTOTAL, CKMB, CKMBINDEX, TROPONINI in the last 168 hours. BNP: Invalid input(s): POCBNP CBG: Recent Labs  Lab 05/14/21 1321 05/14/21 1635 05/14/21 2135 05/15/21 0431 05/15/21 0819  GLUCAP 76 126* 130* 149* 156*   D-Dimer No results for input(s): DDIMER in the last 72 hours. Hgb A1c No results for input(s): HGBA1C in the last 72 hours. Lipid Profile No results for input(s): CHOL, HDL, LDLCALC, TRIG, CHOLHDL, LDLDIRECT in the last 72 hours. Thyroid function studies No results for input(s): TSH, T4TOTAL, T3FREE, THYROIDAB in the last 72 hours.  Invalid input(s): FREET3 Anemia work up No results for input(s): VITAMINB12, FOLATE, FERRITIN, TIBC, IRON, RETICCTPCT in the last 72 hours. Urinalysis No results found for: COLORURINE, APPEARANCEUR, [place], [place], GLUCOSEU, [place], [place], [place], PROTEINUR, UROBILINOGEN, NITRITE, LEUKOCYTESUR Sepsis Labs Invalid input(s): PROCALCITONIN,  WBC,   LACTICIDVEN Microbiology Recent Results (from the past 240 hour(s))  Resp Panel by RT-PCR (Flu A&B, Covid) Nasopharyngeal Swab     Status: None   Collection Time: 05/10/21  7:49 PM   Specimen: Nasopharyngeal Swab; Nasopharyngeal(NP) swabs in vial transport medium  Result Value Ref Range Status   SARS Coronavirus 2 by RT PCR NEGATIVE NEGATIVE Final    Comment: (NOTE) SARS-CoV-2 target nucleic acids are NOT DETECTED.  The SARS-CoV-2 RNA is generally detectable in upper respiratory specimens during the acute phase of infection. The lowest concentration of SARS-CoV-2 viral copies this assay can detect is 138 copies/mL. A negative result does not preclude SARS-Cov-2 infection and should not be used as the sole basis for treatment or other patient management decisions. A negative result may occur with  improper specimen collection/handling, submission of specimen other than nasopharyngeal swab, presence of viral mutation(s) within the areas targeted by this assay, and inadequate number of viral copies(<138 copies/mL). A negative result must be combined with clinical observations, patient history, and epidemiological information. The expected result is Negative.  Fact Sheet for Patients:  EntrepreneurPulse.com.au  Fact Sheet for Healthcare Providers:  IncredibleEmployment.be  This test is no t yet approved or cleared by the [place] and  has been authorized for detection and/or diagnosis of SARS-CoV-2 by FDA under an Emergency Use Authorization (EUA). This EUA will remain  in effect (meaning this test can be used) for the duration of the COVID-19 declaration under Section 564(b)(1) of the Act, 21 U.S.C.section 360bbb-3(b)(1), unless the authorization is terminated  or revoked sooner.       Influenza A by PCR NEGATIVE NEGATIVE Final   Influenza B by PCR NEGATIVE NEGATIVE Final    Comment: (NOTE) The Xpert Xpress SARS-CoV-2/FLU/RSV plus  assay is intended as an aid in the diagnosis of influenza from Nasopharyngeal swab specimens and should not be used as a sole basis for treatment. Nasal washings and aspirates are unacceptable for Xpert Xpress SARS-CoV-2/FLU/RSV testing.  Fact Sheet for Patients: EntrepreneurPulse.com.au  Fact Sheet for Healthcare Providers: IncredibleEmployment.be  This test is not yet approved or cleared by the [place] FDA and has been authorized for detection and/or diagnosis of SARS-CoV-2 by FDA under an Emergency Use Authorization (EUA). This EUA will remain in effect (meaning this test can be used) for the duration of the COVID-19 declaration under Section 564(b)(1) of the Act, 21 U.S.C. section 360bbb-3(b)(1), unless the authorization is terminated or revoked.  Performed at [facility], [address], [place], [place] [address]   Resp Panel by RT-PCR (Flu A&B, Covid) Nasopharyngeal Swab     Status: None   Collection Time: 05/12/21  5:30 PM   Specimen: Nasopharyngeal Swab; Nasopharyngeal(NP) swabs in vial transport medium  Result Value Ref Range Status   SARS Coronavirus 2 by RT PCR NEGATIVE NEGATIVE Final    Comment: (NOTE) SARS-CoV-2 target nucleic acids are NOT DETECTED.  The SARS-CoV-2 RNA is generally detectable in upper respiratory specimens during the acute phase of infection. The lowest concentration of SARS-CoV-2 viral copies this assay can detect is 138 copies/mL. A negative result does not preclude SARS-Cov-2 infection and should not be used as the sole basis for treatment or other patient management decisions. A negative result may occur with  improper specimen collection/handling, submission of specimen other than nasopharyngeal swab, presence of viral mutation(s) within the areas targeted by this assay, and inadequate number of viral copies(<138 copies/mL). A negative result must be combined with clinical observations, patient  history, and epidemiological information. The expected result is Negative.  Fact Sheet for Patients:  EntrepreneurPulse.com.au  Fact Sheet for Healthcare Providers:  IncredibleEmployment.be  This test is no t yet approved or cleared by the [place] FDA and  has been authorized for detection and/or diagnosis of SARS-CoV-2 by FDA under an Emergency Use Authorization (EUA). This EUA will remain  in effect (meaning this test can be used) for the duration of the COVID-19 declaration under Section 564(b)(1) of the Act, 21 U.S.C.section 360bbb-3(b)(1), unless the authorization is terminated  or revoked sooner.       Influenza A by PCR NEGATIVE NEGATIVE Final   Influenza B by PCR NEGATIVE NEGATIVE Final    Comment: (NOTE) The Xpert Xpress SARS-CoV-2/FLU/RSV plus assay is intended as an aid in the diagnosis of influenza from Nasopharyngeal swab specimens and should not be used as a sole basis for treatment. Nasal washings and aspirates are unacceptable for Xpert Xpress SARS-CoV-2/FLU/RSV testing.  Fact Sheet for Patients: EntrepreneurPulse.com.au  Fact Sheet for Healthcare Providers: IncredibleEmployment.be  This test is not yet approved or cleared by the [place] FDA and has been authorized for detection  and/or diagnosis of SARS-CoV-2 by FDA under an Emergency Use Authorization (EUA). This EUA will remain in effect (meaning this test can be used) for the duration of the COVID-19 declaration under Section 564(b)(1) of the Act, 21 U.S.C. section 360bbb-3(b)(1), unless the authorization is terminated or revoked.  Performed at [facility], [address] [place], [place] [address]   MRSA Next Gen by PCR, Nasal     Status: Abnormal   Collection Time: 05/14/21  7:50 PM   Specimen: Nasal Mucosa; Nasal Swab  Result Value Ref Range Status   MRSA by PCR Next Gen DETECTED (A) NOT DETECTED Final    Comment:  CRITICAL RESULT CALLED TO, READ BACK BY AND VERIFIED WITH: WATLINGTON,J 2125 05/14/2021 COLEMAN,R (NOTE) The GeneXpert MRSA Assay (FDA approved for NASAL specimens only), is one component of a comprehensive MRSA colonization surveillance program. It is not intended to diagnose MRSA infection nor to guide or monitor treatment for MRSA infections. Test performance is not FDA approved in patients less than 59 years old. Performed at [facility], [address] [place], [place] [address]     Time coordinating discharge: 37 minutes   SIGNED:  Irwin Brakeman, MD  [facility] 05/15/2021, 11:25 AM How to contact the [facility] Attending or Consulting provider [place] or covering provider during after hours [place], for this patient?  Check the care team in [facility] and look for a) attending/consulting [facility] provider listed and b) the [facility] team listed Log into [email or web address] and use 's universal password to access. If you do not have the password, please contact the hospital operator. Locate the [facility] provider you are looking for under [facility] and page to a number that you can be directly reached. If you still have difficulty reaching the provider, please page the [facility] (Director on Call) for the Hospitalists listed on amion for assistance.

## 2021-05-17 ENCOUNTER — Encounter (HOSPITAL_COMMUNITY): Payer: Self-pay | Admitting: Internal Medicine

## 2021-05-17 ENCOUNTER — Other Ambulatory Visit: Payer: Self-pay | Admitting: Family Medicine

## 2021-05-17 MED ORDER — SARNA 0.5-0.5 % EX LOTN: 1.0000 "application " | TOPICAL_LOTION | CUTANEOUS | 2 refills | Status: DC | PRN

## 2021-05-17 NOTE — Progress Notes (Signed)
Pt called for [employer] Rx.  Told her it was over the counter but we sent Rx in to pharmacy electronically to make it easier for her.    Murvin Natal, MD How to contact the [facility] Attending or Consulting provider [place] or covering provider during after hours [place], for this patient?  Check the care team in [facility] and look for a) attending/consulting TR[facility] provider listed and b) the [name] team listed Log into [email or web address] and use [place]'s universal password to access. If you do not have the password, please contact the hospital operator. Locate the [facility] provider you are looking for under [facility] and page to a number that you can be directly reached. If you still have difficulty reaching the provider, please page the [facility] (Director on Call) for the Hospitalists listed on amion for assistance.

## 2021-05-18 ENCOUNTER — Encounter: Payer: Self-pay | Admitting: Internal Medicine

## 2021-05-18 LAB — SURGICAL PATHOLOGY

## 2021-05-19 ENCOUNTER — Other Ambulatory Visit: Payer: Self-pay

## 2021-05-19 ENCOUNTER — Encounter: Payer: Self-pay | Admitting: Internal Medicine

## 2021-05-19 ENCOUNTER — Ambulatory Visit (INDEPENDENT_AMBULATORY_CARE_PROVIDER_SITE_OTHER): Payer: Medicaid - Out of State | Admitting: Internal Medicine

## 2021-05-19 VITALS — BP 116/60 | HR 77 | Temp 96.9°F | Wt 200.0 lb

## 2021-05-19 DIAGNOSIS — R152 Fecal urgency: Secondary | ICD-10-CM | POA: Diagnosis not present

## 2021-05-19 DIAGNOSIS — K219 Gastro-esophageal reflux disease without esophagitis: Secondary | ICD-10-CM

## 2021-05-19 DIAGNOSIS — K922 Gastrointestinal hemorrhage, unspecified: Secondary | ICD-10-CM | POA: Diagnosis not present

## 2021-05-19 MED ORDER — CHOLESTYRAMINE LIGHT 4 G PO PACK: 4.0000 g | PACK | Freq: Two times a day (BID) | ORAL | 5 refills | Status: DC

## 2021-05-19 NOTE — Patient Instructions (Signed)
I am happy to hear your bleeding has stopped.  Continue to monitor.  Continue on pantoprazole for your chronic reflux.  For your stool urgency and occasional diarrhea, I am going to start you on cholestyramine 4 g daily.  Mix this with 6 ounces of water or juice.  Be sure to take this at least 1 hour after your other medications as it can affect absorption of your other medications.  I would also ask your nephrologist if okay to take this medication prior to starting this.  Follow-up in 1 year or sooner if needed.  At [facility] we value your feedback. You may receive a survey about your visit today. Please share your experience as we strive to create trusting relationships with our patients to provide genuine, compassionate, quality care.  We appreciate your understanding and patience as we review any laboratory studies, imaging, and other diagnostic tests that are ordered as we care for you. Our office policy is 5 business days for review of these results, and any emergent or urgent results are addressed in a timely manner for your best interest. If you do not hear from our office in 1 week, please contact us.   We also encourage the use of MyChart, which contains your medical information for your review as well. If you are not enrolled in this feature, an access code is on this after visit summary for your convenience. Thank you for allowing [name] to be involved in your care.  It was great to see you today!  I hope you have a great rest of your summer!!    Amy Moses. Abbey Chatters, D.O. Gastroenterology and Hepatology [facility]

## 2021-05-19 NOTE — Progress Notes (Signed)
Referring Provider: Leeanne Rio, MD Primary Care Physician:  Leeanne Rio, MD Primary GI:  Dr. Abbey Chatters  Chief Complaint  Patient presents with   hospital f/u    Doing ok, no bleeding    HPI:   Amy Moses is a 50 y.o. female who presents to the clinic today for hospital follow-up visit.  She initially presented to [facility] for rectal bleeding and acute blood loss anemia on 04/22/2021.    She had EGD and colonoscopy performed 04/24/2021. EGD with duodenal erosions and gastritis biopsied (pathology with peptic duodenitis, reactive gastropathy with erosions/chronic inflammation, negative for H. pylori) colonoscopy with nonbleeding internal hemorrhoids, 1 large (25 mm) pedunculated transverse colon polyp (prolapse type polyp) with adherent clot and stigmata of recent bleed which was resected with hot snare and suspected likely source of patient's acute GI bleed.  She presented back to the ER 05/12/2021 with further hematochezia.  Underwent colonoscopy by Dr. Gala Romney which showed the post polypectomy site from previous colonoscopy looked good besides small vascular protuberance.  3 clips were placed at that time.  Also had a 5 mm rectal polyp removed which was also a prolapse type polyp.  She was discharged home without further bleeding.  Most recent hemoglobin 7.9.  Today she states she is doing well.  Has not had any further evidence of bleeding.  No weakness/fatigue.  Does have chronic reflux which is well controlled on pantoprazole.  Her main complaint for me today is fecal urgency and diarrhea.  She states she has had these issues since having her gallbladder removed 20+ years ago.  Past Medical History:  Diagnosis Date   Anemia    Blindness of right eye with low vision in contralateral eye    s/p victrectomy   Diabetes mellitus, type II ([place])    Dyslipidemia    Glaucoma    Hypertension    Hypothyroidism (acquired)    Kidney disease    Stage 5   Pneumonia     Past  Surgical History:  Procedure Laterality Date   ABDOMINAL AORTOGRAM W/LOWER EXTREMITY Bilateral 12/18/2020   Procedure: ABDOMINAL AORTOGRAM W/LOWER EXTREMITY;  Surgeon: Elam Dutch, MD;  Location: [place] [facility];  Service: Cardiovascular;  Laterality: Bilateral;   ANKLE FRACTURE SURGERY     AV FISTULA PLACEMENT Left 08/18/2020   Procedure: LEFT ARM BRACHIOCEPHALIC ARTERIOVENOUS (AV) FISTULA CREATION;  Surgeon: Elam Dutch, MD;  Location: [place];  Service: Vascular;  Laterality: Left;   BIOPSY  04/24/2021   Procedure: BIOPSY;  Surgeon: Eloise Harman, DO;  [facility];  Service: Endoscopy;;   CESAREAN SECTION     CHOLECYSTECTOMY     COLONOSCOPY  04/24/2021   Surgeon: Eloise Harman, DO;  nonbleeding internal hemorrhoids, 1 large (25 mm) pedunculated transverse colon polyp (prolapse type polyp) with adherent clot and stigmata of recent bleed.   COLONOSCOPY WITH PROPOFOL N/A 05/14/2021   Procedure: COLONOSCOPY WITH PROPOFOL;  Surgeon: Daneil Dolin, MD;  [facility];  Service: Endoscopy;  Laterality: N/A;   ESOPHAGOGASTRODUODENOSCOPY (EGD) WITH PROPOFOL N/A 04/24/2021   Surgeon: Eloise Harman, DO;  duodenal erosions and gastritis biopsied (pathology with peptic duodenitis, reactive gastropathy with erosions/chronic inflammation, negative for H. pylori)   EYE SURGERY     Vatrectomy   HEMOSTASIS CLIP PLACEMENT  05/14/2021   Procedure: HEMOSTASIS CLIP PLACEMENT;  Surgeon: Daneil Dolin, MD;  [facility];  Service: Endoscopy;;   IR PERC TUN  PERIT CATH WO PORT S&I /IMAG  09/15/2020   IR REMOVAL TUN CV CATH W/O FL  02/19/2021   IR US GUIDE VASC ACCESS RIGHT  09/15/2020   POLYPECTOMY  04/24/2021   Procedure: POLYPECTOMY;  Surgeon: Eloise Harman, DO;  [facility];  Service: Endoscopy;;   POLYPECTOMY  05/14/2021   Procedure: POLYPECTOMY;  Surgeon: Daneil Dolin, MD;  [facility];  Service: Endoscopy;;   TOE  SURGERY      Current Outpatient Medications  Medication Sig Dispense Refill   acetaminophen (TYLENOL) 500 MG tablet Take 1,000 mg by mouth every 6 (six) hours as needed for moderate pain.     aspirin EC 81 MG tablet Take 1 tablet (81 mg total) by mouth daily with breakfast. 30 tablet 2   atorvastatin (LIPITOR) 10 MG tablet Take 1 tablet (10 mg total) by mouth daily. 90 tablet 5   camphor-menthol (SARNA) lotion Apply 1 application topically as needed for itching. 222 mL 2   cholestyramine light (PREVALITE) 4 g packet Take 1 packet (4 g total) by mouth 2 (two) times daily. 60 packet 5   HUMALOG KWIKPEN 100 UNIT/ML KwikPen Inject 2 Units into the skin 3 (three) times daily with meals. If eats 50% or more of meal. 15 mL 11   insulin glargine (LANTUS) 100 UNIT/ML injection Inject 0.05 mLs (5 Units total) into the skin at bedtime. 10 mL 11   levothyroxine (SYNTHROID) 50 MCG tablet Take 1 tablet (50 mcg total) by mouth daily before breakfast. 30 tablet 5   metoprolol succinate (TOPROL-XL) 50 MG 24 hr tablet Take 1 tablet (50 mg total) by mouth at bedtime. Take with or immediately following a meal. 90 tablet 5   pantoprazole (PROTONIX) 40 MG tablet Take 1 tablet (40 mg total) by mouth 2 (two) times daily. 60 tablet 5   polyethylene glycol (MIRALAX) 17 g packet Take 17 g by mouth daily as needed for mild constipation. 28 each 1   sevelamer carbonate (RENVELA) 800 MG tablet Take 1 tablet (800 mg total) by mouth 3 (three) times daily with meals. 90 tablet 3   torsemide (DEMADEX) 100 MG tablet Take 150 mg by mouth daily.      valACYclovir (VALTREX) 500 MG tablet Take 1 tablet (500 mg total) by mouth daily for 7 days. 7 tablet 0   Vitamin D, Ergocalciferol, (DRISDOL) 1.25 MG (50000 UNIT) CAPS capsule Take 1 capsule by mouth every 14 (fourteen) days.     No current facility-administered medications for this visit.    Allergies as of 05/19/2021 - Review Complete 05/19/2021  Allergen Reaction Noted   Ace  inhibitors Cough 11/19/2020    Family History  Problem Relation Age of Onset   Heart disease Mother    Diabetes Mother    Kidney disease Mother    Diabetes Father    Heart disease Father    Diabetes Brother    Colon cancer Neg Hx     Social History   Socioeconomic History   Marital status: Single    Spouse name: Not on file   Number of children: Not on file   Years of education: Not on file   Highest education level: Not on file  Occupational History   Not on file  Tobacco Use   Smoking status: Never   Smokeless tobacco: Never  Vaping Use   Vaping Use: Never used  Substance and Sexual Activity   Alcohol use: No   Drug use: No  Sexual activity: Yes    Birth control/protection: Condom  Other Topics Concern   Not on file  Social History Narrative   Not on file   Social Determinants of Health   Financial Resource Strain: Not on file  Food Insecurity: Not on file  Transportation Needs: Not on file  Physical Activity: Not on file  Stress: Not on file  Social Connections: Not on file    Subjective: Review of Systems  Constitutional:  Negative for chills and fever.  HENT:  Negative for congestion and hearing loss.   Eyes:  Negative for blurred vision and double vision.  Respiratory:  Negative for cough and shortness of breath.   Cardiovascular:  Negative for chest pain and palpitations.  Gastrointestinal:  Positive for diarrhea. Negative for abdominal pain, blood in stool, constipation, heartburn, melena and vomiting.  Genitourinary:  Negative for dysuria and urgency.  Musculoskeletal:  Negative for joint pain and myalgias.  Skin:  Negative for itching and rash.  Neurological:  Negative for dizziness and headaches.  Psychiatric/Behavioral:  Negative for depression. The patient is not nervous/anxious.     Objective: BP 116/60   Pulse 77   Temp (!) 96.9 F (36.1 C) (Temporal)   Wt 200 lb (90.7 kg) Comment: stated by patient  LMP 08/22/2015 (Approximate)    BMI 32.28 kg/m  Physical Exam Constitutional:      Appearance: Normal appearance.     Comments: Wheelchair  HENT:     Head: Normocephalic and atraumatic.  Eyes:     Extraocular Movements: Extraocular movements intact.     Conjunctiva/sclera: Conjunctivae normal.  Cardiovascular:     Rate and Rhythm: Normal rate and regular rhythm.     Heart sounds: Murmur heard.  Pulmonary:     Effort: Pulmonary effort is normal.     Breath sounds: Normal breath sounds.  Abdominal:     General: Bowel sounds are normal.     Palpations: Abdomen is soft.  Musculoskeletal:        General: No swelling. Normal range of motion.     Cervical back: Normal range of motion and neck supple.  Skin:    General: Skin is warm and dry.     Coloration: Skin is not jaundiced.  Neurological:     General: No focal deficit present.     Mental Status: She is alert and oriented to person, place, and time.  Psychiatric:        Mood and Affect: Mood normal.        Behavior: Behavior normal.     Assessment: *Recent GI bleeding-resolved *Chronic reflux-well-controlled on pantoprazole *Diarrhea  Plan: Patient without further GI bleeding since recent colonoscopy.  We will continue to monitor.  We will plan on surveillance colonoscopy in 5 years.  Chronic reflux well controlled on pantoprazole.  We will continue.  In regards to her urgency and diarrhea, will trial on cholestyramine for bile acid diarrhea as she states she has had the symptoms since having her gallbladder removed.  No dosage changes needed for ESRD though there is a small increase chance of acidosis.  Patient to discuss with her nephrologist if okay to take this medicine prior to starting.  Also counseled on taking this medication at least 1 hour after her other meds as it can affect absorption.  Follow-up in 1 year.  05/19/2021 8:33 AM   Disclaimer: This note was dictated with voice recognition software. Similar sounding words can inadvertently  be transcribed and may not be corrected upon  review.

## 2021-05-21 ENCOUNTER — Other Ambulatory Visit
Admission: RE | Admit: 2021-05-21 | Discharge: 2021-05-21 | Disposition: A | Discharge status: Home / Self Care | Payer: Medicaid - Out of State | Source: Ambulatory Visit | Attending: Physician Assistant | Admitting: Physician Assistant

## 2021-05-21 ENCOUNTER — Encounter: Payer: Medicaid - Out of State | Attending: Physician Assistant | Admitting: Physician Assistant

## 2021-05-21 ENCOUNTER — Other Ambulatory Visit: Payer: Self-pay

## 2021-05-21 DIAGNOSIS — L97822 Non-pressure chronic ulcer of other part of left lower leg with fat layer exposed: Secondary | ICD-10-CM | POA: Insufficient documentation

## 2021-05-21 DIAGNOSIS — E1042 Type 1 diabetes mellitus with diabetic polyneuropathy: Secondary | ICD-10-CM | POA: Insufficient documentation

## 2021-05-21 DIAGNOSIS — L89323 Pressure ulcer of left buttock, stage 3: Secondary | ICD-10-CM | POA: Insufficient documentation

## 2021-05-21 DIAGNOSIS — Z794 Long term (current) use of insulin: Secondary | ICD-10-CM | POA: Insufficient documentation

## 2021-05-21 DIAGNOSIS — H42 Glaucoma in diseases classified elsewhere: Secondary | ICD-10-CM | POA: Insufficient documentation

## 2021-05-21 DIAGNOSIS — E10622 Type 1 diabetes mellitus with other skin ulcer: Secondary | ICD-10-CM | POA: Insufficient documentation

## 2021-05-21 DIAGNOSIS — L089 Local infection of the skin and subcutaneous tissue, unspecified: Secondary | ICD-10-CM | POA: Diagnosis not present

## 2021-05-21 DIAGNOSIS — E1022 Type 1 diabetes mellitus with diabetic chronic kidney disease: Secondary | ICD-10-CM | POA: Insufficient documentation

## 2021-05-21 DIAGNOSIS — N186 End stage renal disease: Secondary | ICD-10-CM | POA: Insufficient documentation

## 2021-05-21 DIAGNOSIS — Z992 Dependence on renal dialysis: Secondary | ICD-10-CM | POA: Insufficient documentation

## 2021-05-21 DIAGNOSIS — L89610 Pressure ulcer of right heel, unstageable: Secondary | ICD-10-CM | POA: Insufficient documentation

## 2021-05-21 DIAGNOSIS — L89313 Pressure ulcer of right buttock, stage 3: Secondary | ICD-10-CM | POA: Insufficient documentation

## 2021-05-21 DIAGNOSIS — I120 Hypertensive chronic kidney disease with stage 5 chronic kidney disease or end stage renal disease: Secondary | ICD-10-CM | POA: Insufficient documentation

## 2021-05-21 DIAGNOSIS — E1039 Type 1 diabetes mellitus with other diabetic ophthalmic complication: Secondary | ICD-10-CM | POA: Insufficient documentation

## 2021-05-21 DIAGNOSIS — L97812 Non-pressure chronic ulcer of other part of right lower leg with fat layer exposed: Secondary | ICD-10-CM | POA: Insufficient documentation

## 2021-05-21 NOTE — Progress Notes (Addendum)
ROHA, POND ([id number]) Visit Report for 05/21/2021 Chief Complaint Document Details Patient Name: Amy Moses, Amy Moses Date of Service: 05/21/2021 10:45 AM Medical Record Number: [id number] Patient Account Number: [id number] Date of Birth/Sex: September 18, 1971 (50 y.o. F) Treating RN: Carlene Coria Primary Care Provider: Zenon Mayo Other Clinician: Referring Provider: Zenon Mayo Treating Provider/Extender: Skipper Cliche in Treatment: 24 Information Obtained from: Patient Chief Complaint 12/02/2020; patient is here for review of extensive wounds on her bilateral thighs as well as an area on her right plantar heel Electronic Signature(s) Signed: 05/21/2021 10:40:40 AM By: Worthy Keeler PA-C Entered By: Worthy Keeler on 05/21/2021 10:40:40 Amy Moses ([id number]) -------------------------------------------------------------------------------- Debridement Details Patient Name: Amy Moses Date of Service: 05/21/2021 10:45 AM Medical Record Number: [id number] Patient Account Number: [id number] Date of Birth/Sex: 01-Oct-1971 (49 y.o. F) Treating RN: Cornell Barman Primary Care Provider: Zenon Mayo Other Clinician: Referring Provider: Zenon Mayo Treating Provider/Extender: Skipper Cliche in Treatment: 24 Debridement Performed for Wound #11 Right,Distal,Posterior Lower Leg Assessment: Performed By: Physician Tommie Sams., PA-C Debridement Type: Debridement Level of Consciousness (Pre- Awake and Alert procedure): Pre-procedure Verification/Time Out Yes - 11:26 Taken: Total Area Debrided (L x W): 8.1 (cm) x 5.6 (cm) = 45.36 (cm) Tissue and other material Non-Viable, Eschar, Slough, Slough debrided: Level: Non-Viable Tissue Debridement Description: Selective/Open Wound Instrument: Curette Bleeding: Minimum Hemostasis Achieved: Pressure Response to Treatment: Procedure was tolerated well Level of Consciousness (Post- Awake and Alert procedure): Post  Debridement Measurements of Total Wound Length: (cm) 8.1 Width: (cm) 5.6 Depth: (cm) 0.6 Volume: (cm) 21.375 Character of Wound/Ulcer Post Debridement: Stable Post Procedure Diagnosis Same as Pre-procedure Electronic Signature(s) Signed: 05/21/2021 4:09:56 PM By: Worthy Keeler PA-C Signed: 06/07/2021 1:36:15 PM By: Gretta Cool, BSN, RN, CWS, Kim RN, BSN Entered By: Gretta Cool, BSN, RN, CWS, Kim on 05/21/2021 11:27:27 Amy Moses ([id number]) -------------------------------------------------------------------------------- Debridement Details Patient Name: Amy Moses Date of Service: 05/21/2021 10:45 AM Medical Record Number: [id number] Patient Account Number: [id number] Date of Birth/Sex: Dec 23, 1970 (50 y.o. F) Treating RN: Cornell Barman Primary Care Provider: Zenon Mayo Other Clinician: Referring Provider: Zenon Mayo Treating Provider/Extender: Skipper Cliche in Treatment: 24 Debridement Performed for Wound #9 Left,Lateral Calf Assessment: Performed By: Physician Tommie Sams., PA-C Debridement Type: Debridement Level of Consciousness (Pre- Awake and Alert procedure): Pre-procedure Verification/Time Out Yes - 11:26 Taken: Total Area Debrided (L x W): 5 (cm) x 4.5 (cm) = 22.5 (cm) Tissue and other material Non-Viable, Eschar, Slough, Slough debrided: Level: Non-Viable Tissue Debridement Description: Selective/Open Wound Instrument: Curette Specimen: Swab, Number of Specimens Taken: 1 Bleeding: Minimum Hemostasis Achieved: Pressure Response to Treatment: Procedure was tolerated well Level of Consciousness (Post- Awake and Alert procedure): Post Debridement Measurements of Total Wound Length: (cm) 11.5 Width: (cm) 4.5 Depth: (cm) 1.4 Volume: (cm) 56.902 Character of Wound/Ulcer Post Debridement: Stable Post Procedure Diagnosis Same as Pre-procedure Electronic Signature(s) Signed: 05/21/2021 4:09:56 PM By: Worthy Keeler PA-C Signed: 06/07/2021 1:36:15 PM  By: Gretta Cool, BSN, RN, CWS, Kim RN, BSN Entered By: Gretta Cool, BSN, RN, CWS, Kim on 05/21/2021 11:28:26 Amy Moses ([id number]) -------------------------------------------------------------------------------- HPI Details Patient Name: Amy Moses Date of Service: 05/21/2021 10:45 AM Medical Record Number: [id number] Patient Account Number: [id number] Date of Birth/Sex: March 18, 1971 (50 y.o. F) Treating RN: Carlene Coria Primary Care Provider: Zenon Mayo Other Clinician: Referring Provider: Zenon Mayo Treating Provider/Extender: Skipper Cliche in Treatment: 24 History of Present Illness HPI Description: ADMISSION 12/02/2020 This is a 50 year old woman who is a type II diabetic. Although there are mentions of type  1 diabetes in epic clearly this woman is type II based on the fact that she was on oral agents for 10 years before starting insulin. She also is in chronic renal failure and recently started on dialysis I think in November. She relates her problems starting in September she started to develop skin excoriation on her bilateral inner thighs. She thought this was a friction phenomenon however the tissue wrist can progressively broke down. She was seen by her primary doctor on 10/21/2020 noted to have erythema on both thighs medially and a blister. MRIs were ordered and she was put on Silvadene and gauze. She was seen in the ER on 12/12 at the [place] clinic also noted to have a right unstageable heel wound. 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 She dialyzes at [facility] in [facility] and she was started on sodium thiosulfate as far as the notes state. The patient states she gets something at dialysis every day although she has not exactly sure what they are giving her. She was noted by vein and vascular in [place] to have multiple open wounds on 11/19/2020 using Xeroform gauze. She had an angiogram booked by Dr.  Oneida Alar predominantly I think because of the right heel ulcer although this was canceled by the patient because of the weather. The patient has large necrotic wounds on both inner thighs with covering black eschar. There is also an area on the left lateral thigh and an eschared area on her right plantar heel. She has been using Betadine to the right heel Xeroform to the areas on her legs. The patient has Medicaid but is able to get the Xeroform, were not really sure how she is managing this although she lives in [place] and there may be different rules for Medicaid in [place] versus [place]. We certainly would not be able to get that here. 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 I wondered whether her neuropathy is particularly affected her sensation of pain since her recent left patella fracture does not seem to have been that painful either Past medical history includes stage V chronic renal failure starting on dialysis I think in November, clearly type 2 diabetes with neuropathy, hypertension, glaucoma, vitamin D deficiency, history of cholecystectomy, edema of both legs, hypothyroidism, she dialyzes Tuesday Thursday and Saturday at [facility] in [place] The patient had arterial studies on 11/19/2020 I think at vein and vascular in [place]. She had biphasic waveforms throughout the thigh on the right and at the popliteal proximally and distally in the ATA but the PTA and peroneal were not visualized. On the left again biphasic waveforms up to the level of the distal popliteal but not visualized in the tibial vessels. She was felt to have adequate flow where visualized. As noted she was supposed to have an angiogram which I think is certainly indicated [id number] upon evaluation today patient actually appears to be doing okay in regard to her wounds. This is actually the first time of seeing her  she saw Dr. Dellia Nims at the last visit she does have quite an extensive history based on review. With that being said I do not see any signs right now of active infection which is great news. Overall I am extremely pleased with where things stand in that regard. With that being said I do not believe the Xeroform is doing much for the calciphylaxis  areas on her thighs and lateral or medial locations. I really feel like she may do better with Dakin's moistened gauze. I do think that we can see about ordering the Dakin's for her she is already using gauze and then wrapping with roll gauze anyway so really would not change much except for moistening some gauze and applying it to the wound bed. With that being said I do not see any signs of active infection at this time which is great news. The patient all in all seems to be in fairly good spirits all things considered. 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 Fortunately it looks like some of the eschar is loosening up so we should be able to get this removed which hopefully will help in speeding up the healing process. With that being said with regard to the patient's gluteal region she unfortunately has new pressure ulcerations open today which I think could benefit from possibly having a air mattress. With that being said I explained to the patient that unfortunately this could still take some time as far as getting it to heal completely. There does not appear to be any signs of active infection at this time which is great news. No fevers, chills, nausea, vomiting, or diarrhea. 02/16/2021 upon evaluation today patient appears to be doing about the same in regard to her heel. Her gluteal area is actually getting better. She did have surgery in regard to her thighs bilaterally as well as her knee. It was not until she got into  the operating room that it was noted that she had the wounds on the thighs. Subsequently according to the surgeons note dated 02/01/2021 they contacted the patient's daughter in order to discuss the fact they felt she needed to have the wound surgically debrided and a wound VAC placed and subsequently instead of patellar reconstruction they opted to proceed with removal of the Portion of the patella in order to get this area to heal more effectively and quickly. Overall that seems to have done quite well. 02/26/2021 upon evaluation today patient's wounds actually appear to be doing excellent pretty much across the board I am very pleased. With that being said the wounds where she has the wound vacs placed on the thighs in particular seems to be doing a very good job as far as healing is concerned. There does not appear to be any evidence of infection which is great news and overall I am extremely pleased in that regard. No fevers, chills, nausea, vomiting, or diarrhea. 03/12/21 upon evaluation today patient appears to be doing well with regard to her wounds. I think she is doing excellent with the wound VAC and very pleased in that regard. Fortunately there does not appear to be any signs of infection I am that a try to do a little bit of light debridement today due to some necrotic regions noted in her wounds to try to help speed up the healing process here. 04/05/2021 upon evaluation today patient appears to be doing decently well in general in regard to her wounds. She was unfortunately in the hospital from the 10th through the 12th of this month due to having high fever and subsequently they did not actually determine exactly what was going on they thought she might of just been sick or had some kind of a cold. With that being said fortunately there does not  appear to be any evidence of active infection at this time which is great news. No fevers, chills, nausea, vomiting, or diarrhea. Amy Moses, Amy Moses ([id number]) 05/03/2021 upon evaluation today patient actually appears to be doing excellent for the most part in regard to the wounds on her thighs. I am extremely happy with what I see today. In fact I feel like it is probably time for [name] to discontinue the use of the wound VAC as this appears to be doing so well. The patient voiced an understanding and agreement and is definitely happy to get rid of the wound VAC. She was recently in the hospital but this was secondary to something unrelated to her wound she had a polyp which was bleeding and had to be taken care of this was causing significant issues she has since been taken off of her aspirin. 05/21/2021 upon evaluation today patient appears to be doing well with regard to her wound. She has been tolerating the dressing changes without complication with regard to the [facility] in the upper thigh region. We have been using Dakin's moistened gauze on the legs and the heel. Fortunately everything seems to be making good progress here in general which is great news. The unfortunate thing is that we have been having issues here with some necrotic tissue and drainage on the lateral portions of her legs. Fortunately there does not appear to be any signs of systemic infection although locally there may be a little bit of infection here based on the drainage which is somewhat blue-green in nature. I am going to perform a culture postdebridement of the right calf/lower extremity region. She tolerated that today without complication. I did perform that culture as well. Electronic Signature(s) Signed: 05/21/2021 1:11:32 PM By: Worthy Keeler PA-C Entered By: Worthy Keeler on 05/21/2021 13:11:32 Amy Moses ([id number]) -------------------------------------------------------------------------------- Physical Exam Details Patient Name: Amy Moses Date of Service: 05/21/2021 10:45 AM Medical Record Number: [id number] Patient Account  Number: [id number] Date of Birth/Sex: 1971/02/16 (49 y.o. F) Treating RN: Carlene Coria Primary Care Provider: Zenon Mayo Other Clinician: Referring Provider: Zenon Mayo Treating Provider/Extender: Skipper Cliche in Treatment: 24 Constitutional Chronically ill appearing but in no apparent acute distress. Respiratory normal breathing without difficulty. Psychiatric this patient is able to make decisions and demonstrates good insight into disease process. Alert and Oriented x 3. pleasant and cooperative. Notes Patient's wound bed showed signs again pretty much of doing well at all locations that for the legs were I did perform debridement on both sides left and right. Postdebridement this appears to be doing much better. The patient does have some drainage which is potentially consistent with Pseudomonas I did perform a culture we will see with that shows make any adjustments as necessary. Electronic Signature(s) Signed: 05/21/2021 1:12:05 PM By: Worthy Keeler PA-C Entered By: Worthy Keeler on 05/21/2021 13:12:05 Amy Moses ([id number]) -------------------------------------------------------------------------------- Physician Orders Details Patient Name: Amy Moses Date of Service: 05/21/2021 10:45 AM Medical Record Number: [id number] Patient Account Number: [id number] Date of Birth/Sex: [birth year] (49 y.o. F) Treating RN: Cornell Barman Primary Care Provider: Zenon Mayo Other Clinician: Referring Provider: Zenon Mayo Treating Provider/Extender: Skipper Cliche in Treatment: 24 Verbal / Phone Orders: No Diagnosis Coding ICD-10 Coding Code Description E83.59 Other disorders of calcium metabolism L97.118 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 L97.128 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 L89.610 Pressure ulcer of right heel, unstageable E11.42 Type 2 diabetes mellitus with diabetic  polyneuropathy L89.313 Pressure ulcer of right buttock, stage 3 L89.323 Pressure ulcer of left buttock, stage 3 L97.822 Non-pressure chronic ulcer of other part of left lower leg with fat layer exposed L97.812 Non-pressure chronic ulcer of other part of right lower leg with fat layer exposed Follow-up Appointments o Return Appointment in 2 weeks. [place] Orders/Instructions: - Home Health Not Available Wound Treatment Wound #1 - Upper Leg Wound Laterality: Right, Medial Cleanser: Normal Saline 3 x Per Week/30 Days Discharge Instructions: Wash your hands with soap and water. Remove old dressing, discard into plastic bag and place into trash. Cleanse the wound with Normal Saline prior to applying a clean dressing using gauze sponges, not tissues or cotton balls. Do not scrub or use excessive force. Pat dry using gauze sponges, not tissue or cotton balls. Primary Dressing: Hydrofera Blue Ready Transfer Foam, 2.5x2.5 (in/in) 3 x Per Week/30 Days Discharge Instructions: Apply Hydrofera Blue Ready to wound bed as directed Secondary Dressing: ABD Pad 5x9 (in/in) 3 x Per Week/30 Days Discharge Instructions: Cover with ABD pad Secured With: 56M Medipore H Soft Cloth Surgical Tape, 2x2 (in/yd) 3 x Per Week/30 Days Wound #10 - Calf Wound Laterality: Right, Lateral Cleanser: Dakin 16 (oz) 0.25 1 x Per Day/30 Days Discharge Instructions: Use as directed. Primary Dressing: Gauze 1 x Per Day/30 Days Discharge Instructions: As directed: dry, moistened with saline or moistened with Dakins Solution Secondary Dressing: ABD Pad 5x9 (in/in) 1 x Per Day/30 Days Discharge Instructions: Cover with ABD pad Secured With: 56M Medipore H Soft Cloth Surgical Tape, 2x2 (in/yd) 1 x Per Day/30 Days Secured With: Conforming Stretch Gauze Bandage 4x75 (in/in) 1 x Per Day/30 Days Discharge Instructions: Apply as directed Wound #11 - Lower Leg Wound Laterality: Right, Posterior, Distal Cleanser:  Dakin 16 (oz) 0.25 1 x Per Day/30 Days Discharge Instructions: Use as directed. SAPHIA, LAFORTE ([id number]) Primary Dressing: Gauze 1 x Per Day/30 Days Discharge Instructions: As directed: dry, moistened with saline or moistened with Dakins Solution Secondary Dressing: ABD Pad 5x9 (in/in) 1 x Per Day/30 Days Discharge Instructions: Cover with ABD pad Secured With: 56M Medipore H Soft Cloth Surgical Tape, 2x2 (in/yd) 1 x Per Day/30 Days Secured With: Conforming Stretch Gauze Bandage 4x75 (in/in) 1 x Per Day/30 Days Discharge Instructions: Apply as directed Wound #2 - Upper Leg Wound Laterality: Left, Medial Cleanser: Normal Saline 3 x Per Week/30 Days Discharge Instructions: Wash your hands with soap and water. Remove old dressing, discard into plastic bag and place into trash. Cleanse the wound with Normal Saline prior to applying a clean dressing using gauze sponges, not tissues or cotton balls. Do not scrub or use excessive force. Pat dry using gauze sponges, not tissue or cotton balls. Primary Dressing: Hydrofera Blue Ready Transfer Foam, 2.5x2.5 (in/in) 3 x Per Week/30 Days Discharge Instructions: Apply Hydrofera Blue Ready to wound bed as directed Secondary Dressing: ABD Pad 5x9 (in/in) 3 x Per Week/30 Days Discharge Instructions: Cover with ABD pad Secured With: 56M Medipore H Soft Cloth Surgical Tape, 2x2 (in/yd) 3 x Per Week/30 Days Wound #3 - Upper Leg Wound Laterality: Left, Lateral Cleanser: Dakin 16 (oz) 0.25 1 x Per Day/30 Days Discharge Instructions: Use as directed. Primary Dressing: Gauze 1 x Per Day/30 Days Discharge Instructions: As directed: dry, moistened with saline or moistened with Dakins Solution Secondary Dressing: ABD Pad 5x9 (in/in) 1 x Per Day/30 Days Discharge Instructions: Cover with ABD pad Secured With: 56M Medipore H Soft Cloth Surgical Tape,  2x2 (in/yd) 1 x Per Day/30 Days Secured With: Conforming Stretch Gauze Bandage 4x75 (in/in) 1 x Per Day/30  Days Discharge Instructions: Apply as directed Wound #4 - Calcaneus Wound Laterality: Right Primary Dressing: Hydrofera Blue Ready Transfer Foam, 2.5x2.5 (in/in) 1 x Per Day/30 Days Discharge Instructions: Apply Hydrofera Blue Ready to wound bed as directed Secondary Dressing: ABD Pad 5x9 (in/in) 1 x Per Day/30 Days Discharge Instructions: Cover with ABD pad Secured With: 34M Medipore H Soft Cloth Surgical Tape, 2x2 (in/yd) 1 x Per Day/30 Days Secured With: Conforming Stretch Gauze Bandage 4x75 (in/in) 1 x Per Day/30 Days Discharge Instructions: Apply as directed Wound #9 - Calf Wound Laterality: Left, Lateral Cleanser: Dakin 16 (oz) 0.25 1 x Per Day/30 Days Discharge Instructions: Use as directed. Primary Dressing: Gauze 1 x Per Day/30 Days Discharge Instructions: As directed: dry, moistened with saline or moistened with Dakins Solution Secondary Dressing: ABD Pad 5x9 (in/in) 1 x Per Day/30 Days Discharge Instructions: Cover with ABD pad Secured With: 34M Medipore H Soft Cloth Surgical Tape, 2x2 (in/yd) 1 x Per Day/30 Days Amy Moses, Amy Moses ([id number]) Secured With: Conforming Stretch Gauze Bandage 4x75 (in/in) 1 x Per Day/30 Days Discharge Instructions: Apply as directed Electronic Signature(s) Signed: 05/21/2021 4:09:56 PM By: Worthy Keeler PA-C Signed: 06/07/2021 1:36:15 PM By: Gretta Cool, BSN, RN, CWS, Kim RN, BSN Entered By: Gretta Cool, BSN, RN, CWS, Kim on 05/21/2021 11:30:57 Amy Moses ([id number]) -------------------------------------------------------------------------------- Problem List Details Patient Name: Amy Moses Date of Service: 05/21/2021 10:45 AM Medical Record Number: [id number] Patient Account Number: [id number] Date of Birth/Sex: 1971-05-17 (50 y.o. F) Treating RN: Carlene Coria Primary Care Provider: Zenon Mayo Other Clinician: Referring Provider: Zenon Mayo Treating Provider/Extender: Skipper Cliche in Treatment: 24 Active  Problems ICD-10 Encounter Code Description Active Date MDM Diagnosis E83.59 Other disorders of calcium metabolism 12/02/2020 No Yes L97.118 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 12/02/2020 No Yes L97.128 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 12/02/2020 No Yes L89.610 Pressure ulcer of right heel, unstageable 12/02/2020 No Yes E11.42 Type 2 diabetes mellitus with diabetic polyneuropathy 12/02/2020 No Yes L89.313 Pressure ulcer of right buttock, stage 3 01/19/2021 No Yes L89.323 Pressure ulcer of left buttock, stage 3 01/19/2021 No Yes L97.822 Non-pressure chronic ulcer of other part of left lower leg with fat layer 05/03/2021 No Yes exposed L97.812 Non-pressure chronic ulcer of other part of right lower leg with fat layer 05/03/2021 No Yes exposed Inactive Problems Resolved Problems Electronic Signature(s) Signed: 05/21/2021 10:40:34 AM By: Worthy Keeler PA-C Entered By: Worthy Keeler on 05/21/2021 10:40:34 Amy Moses ([id number]) -------------------------------------------------------------------------------- Progress Note Details Patient Name: Amy Moses Date of Service: 05/21/2021 10:45 AM Medical Record Number: [id number] Patient Account Number: [id number] Date of Birth/Sex: 1971/02/07 (49 y.o. F) Treating RN: Carlene Coria Primary Care Provider: Zenon Mayo Other Clinician: Referring Provider: Zenon Mayo Treating Provider/Extender: Skipper Cliche in Treatment: 24 Subjective Chief Complaint Information obtained from Patient 12/02/2020; patient is here for review of extensive wounds on her bilateral thighs as well as an area on her right plantar heel History of Present Illness (HPI) ADMISSION 12/02/2020 This is a 50 year old woman who is a type II diabetic. Although there are mentions of type 1 diabetes in epic clearly this woman is type II based on the fact that she was on oral agents for 10 years  before starting insulin. She also is in chronic renal failure and recently started on dialysis I think in November. She relates her  problems starting in September she started to develop skin excoriation on her bilateral inner thighs. She thought this was a friction phenomenon however the tissue wrist can progressively broke down. She was seen by her primary doctor on 10/21/2020 noted to have erythema on both thighs medially and a blister. MRIs were ordered and she was put on Silvadene and gauze. She was seen in the ER on 12/12 at the [place] clinic also noted to have a right unstageable heel wound. 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 She dialyzes at [facility] in [facility] and she was started on sodium thiosulfate as far as the notes state. The patient states she gets something at dialysis every day although she has not exactly sure what they are giving her. She was noted by vein and vascular in [place] to have multiple open wounds on 11/19/2020 using Xeroform gauze. She had an angiogram booked by Dr. Oneida Alar predominantly I think because of the right heel ulcer although this was canceled by the patient because of the weather. The patient has large necrotic wounds on both inner thighs with covering black eschar. There is also an area on the left lateral thigh and an eschared area on her right plantar heel. She has been using Betadine to the right heel Xeroform to the areas on her legs. The patient has Medicaid but is able to get the Xeroform, were not really sure how she is managing this although she lives in [place] and there may be different rules for Medicaid in [place] versus [place]. We certainly would not be able to get that here. 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 I wondered whether her neuropathy is particularly affected  her sensation of pain since her recent left patella fracture does not seem to have been that painful either Past medical history includes stage V chronic renal failure starting on dialysis I think in November, clearly type 2 diabetes with neuropathy, hypertension, glaucoma, vitamin D deficiency, history of cholecystectomy, edema of both legs, hypothyroidism, she dialyzes Tuesday Thursday and Saturday at [facility] in [place] The patient had arterial studies on 11/19/2020 [facility] [place]. She had biphasic waveforms throughout the thigh on the right and at the popliteal proximally and distally in the ATA but the PTA and peroneal were not visualized. On the left again biphasic waveforms up to the level of the distal popliteal but not visualized in the tibial vessels. She was felt to have adequate flow where visualized. As noted she was supposed to have an angiogram which I think is certainly indicated [id number] upon evaluation today patient actually appears to be doing okay in regard to her wounds. This is actually the first time of seeing her she saw Dr. Dellia Nims at the last visit she does have quite an extensive history based on review. With that being said I do not see any signs right now of active infection which is great news. Overall I am extremely pleased with where things stand in that regard. 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 I really feel like she may do better with Dakin's moistened gauze. I do think that we can see about ordering the Dakin's for her she is already using gauze and then wrapping with roll gauze anyway  so really would not change much except for moistening some gauze and applying it to the wound bed. With that being said I do not see any signs of active infection at this time which is great news. The patient all in all seems to be in fairly good  spirits all things considered. 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 Fortunately it looks like some of the eschar is loosening up so we should be able to get this removed which hopefully will help in speeding up the healing process. With that being said with regard to the patient's gluteal region she unfortunately has new pressure ulcerations open today which I think could benefit from possibly having a air mattress. With that being said I explained to the patient that unfortunately this could still take some time as far as getting it to heal completely. There does not appear to be any signs of active infection at this time which is great news. No fevers, chills, nausea, vomiting, or diarrhea. 02/16/2021 upon evaluation today patient appears to be doing about the same in regard to her heel. Her gluteal area is actually getting better. She did have surgery in regard to her thighs bilaterally as well as her knee. It was not until she got into the operating room that it was noted that she had the wounds on the thighs. Subsequently according to the surgeons note dated 02/01/2021 they contacted the patient's daughter in order to discuss the fact they felt she needed to have the wound surgically debrided and a wound VAC placed and subsequently instead of patellar reconstruction they opted to proceed with removal of the Portion of the patella in order to get this area to heal more effectively and quickly. Overall that seems to have done quite well. 02/26/2021 upon evaluation today patient's wounds actually appear to be doing excellent pretty much across the board I am very pleased. With that being said the wounds where she has the wound vacs placed on the thighs in particular seems to be doing a very good job as far as healing is concerned. There does not appear to be any  evidence of infection which is great news and overall I am extremely pleased in that regard. No fevers, chills, nausea, vomiting, or diarrhea. 03/12/21 upon evaluation today patient appears to be doing well with regard to her wounds. I think she is doing excellent with the wound VAC and very pleased in that regard. Fortunately there does not appear to be any signs of infection I am that a try to do a little bit of light debridement today due to some necrotic regions noted in her wounds to try to help speed up the healing process here. Amy Moses, Amy Moses ([id number]) 04/05/2021 upon evaluation today patient appears to be doing decently well in general in regard to her wounds. She was unfortunately in the hospital from the 10th through the 12th of this month due to having high fever and subsequently they did not actually determine exactly what was going on they thought she might of just been sick or had some kind of a cold. With that being said fortunately there does not appear to be any evidence of active infection at this time which is great news. No fevers, chills, nausea, vomiting, or diarrhea. 05/03/2021 upon evaluation today patient actually appears to be doing excellent for the most part in regard to the wounds  on her thighs. I am extremely happy with what I see today. In fact I feel like it is probably time for [name] to discontinue the use of the wound VAC as this appears to be doing so well. The patient voiced an understanding and agreement and is definitely happy to get rid of the wound VAC. She was recently in the hospital but this was secondary to something unrelated to her wound she had a polyp which was bleeding and had to be taken care of this was causing significant issues she has since been taken off of her aspirin. 05/21/2021 upon evaluation today patient appears to be doing well with regard to her wound. She has been tolerating the dressing changes without complication with regard to the  [facility] in the upper thigh region. We have been using Dakin's moistened gauze on the legs and the heel. Fortunately everything seems to be making good progress here in general which is great news. The unfortunate thing is that we have been having issues here with some necrotic tissue and drainage on the lateral portions of her legs. Fortunately there does not appear to be any signs of systemic infection although locally there may be a little bit of infection here based on the drainage which is somewhat blue-green in nature. I am going to perform a culture postdebridement of the right calf/lower extremity region. She tolerated that today without complication. I did perform that culture as well. Objective Constitutional Chronically ill appearing but in no apparent acute distress. Vitals Time Taken: 10:36 AM, Height: 66 in, Weight: 235 lbs, BMI: 37.9, Temperature: 98 F, Pulse: 78 bpm, Respiratory Rate: 16 breaths/min, Blood Pressure: 153/78 mmHg. Respiratory normal breathing without difficulty. Psychiatric this patient is able to make decisions and demonstrates good insight into disease process. Alert and Oriented x 3. pleasant and cooperative. General Notes: Patient's wound bed showed signs again pretty much of doing well at all locations that for the legs were I did perform debridement on both sides left and right. Postdebridement this appears to be doing much better. The patient does have some drainage which is potentially consistent with Pseudomonas I did perform a culture we will see with that shows make any adjustments as necessary. Integumentary (Hair, Skin) Wound #1 status is Open. Original cause of wound was Gradually Appeared. The date acquired was: 07/22/2020. The wound has been in treatment 24 weeks. The wound is located on the Right,Medial Upper Leg. The wound measures 5cm length x 1.5cm width x 0.1cm depth; 5.89cm^2 area and 0.589cm^3 volume. There is Fat Layer (Subcutaneous  Tissue) exposed. There is no tunneling or undermining noted. There is a medium amount of serous drainage noted. There is medium (34-66%) red granulation within the wound bed. There is a small (1-33%) amount of necrotic tissue within the wound bed including Adherent Slough. Wound #10 status is Open. Original cause of wound was Gradually Appeared. The date acquired was: 02/16/2021. The wound has been in treatment 13 weeks. The wound is located on the Right,Lateral Calf. The wound measures 1.9cm length x 0.9cm width x 0.7cm depth; 1.343cm^2 area and 0.94cm^3 volume. There is Fat Layer (Subcutaneous Tissue) exposed. There is no tunneling or undermining noted. There is a medium amount of serosanguineous drainage noted. Foul odor after cleansing was noted. There is small (1-33%) pink granulation within the wound bed. There is a large (67-100%) amount of necrotic tissue within the wound bed including Eschar and Adherent Slough. Wound #11 status is Open. Original cause of wound  was Gradually Appeared. The date acquired was: 03/21/2021. The wound has been in treatment 6 weeks. The wound is located on the Right,Distal,Posterior Lower Leg. The wound measures 8.1cm length x 5.6cm width x 0.6cm depth; 35.626cm^2 area and 21.375cm^3 volume. There is Fat Layer (Subcutaneous Tissue) exposed. There is no tunneling or undermining noted. There is a large amount of serosanguineous drainage noted. Foul odor after cleansing was noted. There is small (1-33%) red granulation within the wound bed. There is a large (67-100%) amount of necrotic tissue within the wound bed including Eschar. Wound #12 status is Healed - Epithelialized. Original cause of wound was Gradually Appeared. The date acquired was: 04/21/2021. The wound has been in treatment 2 weeks. The wound is located on the Left,Lateral Calcaneus. The wound measures 0cm length x 0cm width x 0cm depth; 0cm^2 area and 0cm^3 volume. Wound #2 status is Open. Original cause of  wound was Gradually Appeared. The date acquired was: 07/22/2020. The wound has been in treatment 24 weeks. The wound is located on the Left,Medial Upper Leg. The wound measures 8.7cm length x 2.5cm width x 0.2cm depth; 17.082cm^2 area and 3.416cm^3 volume. There is Fat Layer (Subcutaneous Tissue) exposed. There is no tunneling or undermining noted. There is a medium amount of serosanguineous drainage noted. There is large (67-100%) red granulation within the wound bed. There is a small (1-33%) amount of necrotic tissue within the wound bed including Adherent Slough. Amy Moses, Amy Moses ([id number]) Wound #3 status is Open. Original cause of wound was Gradually Appeared. The date acquired was: 07/22/2020. The wound has been in treatment 24 weeks. The wound is located on the Left,Lateral Upper Leg. The wound measures 2.6cm length x 2.1cm width x 0.2cm depth; 4.288cm^2 area and 0.858cm^3 volume. There is Fat Layer (Subcutaneous Tissue) exposed. There is no tunneling or undermining noted. There is a medium amount of serosanguineous drainage noted. There is large (67-100%) red granulation within the wound bed. There is a small (1-33%) amount of necrotic tissue within the wound bed including Adherent Slough. Wound #4 status is Open. Original cause of wound was Gradually Appeared. The date acquired was: 10/21/2020. The wound has been in treatment 24 weeks. The wound is located on the Right Calcaneus. The wound measures 3cm length x 3.4cm width x 0.3cm depth; 8.011cm^2 area and 2.403cm^3 volume. There is Fat Layer (Subcutaneous Tissue) exposed. There is no tunneling noted. There is a large amount of serosanguineous drainage noted. There is medium (34-66%) red granulation within the wound bed. There is a medium (34-66%) amount of necrotic tissue within the wound bed including Adherent Slough. Wound #9 status is Open. Original cause of wound was Gradually Appeared. The date acquired was: 02/16/2021. The wound has been  in treatment 13 weeks. The wound is located on the Left,Lateral Calf. The wound measures 11.5cm length x 4.5cm width x 1.4cm depth; 40.644cm^2 area and 56.902cm^3 volume. There is tendon, Fat Layer (Subcutaneous Tissue), and fascia exposed. There is no tunneling or undermining noted. There is a large amount of serosanguineous drainage noted. Foul odor after cleansing was noted. There is small (1-33%) pink granulation within the wound bed. There is a large (67-100%) amount of necrotic tissue within the wound bed including Eschar and Adherent Slough. General Notes: Blue-green drainage for wound. 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 There was a Selective/Open Wound Non-Viable Tissue Debridement with a total area of 45.36 sq cm performed by Tommie Sams., PA-C. With the following instrument(s): Curette to remove Non-Viable tissue/material. Material removed includes Eschar and Slough and. No specimens were taken. A time out was conducted at 11:26, prior to the start of the procedure. A Minimum amount of bleeding was controlled with Pressure. The procedure was tolerated well. Post Debridement Measurements: 8.1cm length x 5.6cm width x 0.6cm depth; 21.375cm^3 volume. Character of Wound/Ulcer Post Debridement is stable. Post procedure Diagnosis Wound #11: Same as  Pre-Procedure Wound #9 Pre-procedure diagnosis of Wound #9 is a 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 There was a Selective/Open Wound Non-Viable Tissue Debridement with a total area of 22.5 sq cm performed by Tommie Sams., PA-C. With the following instrument(s): Curette to remove Non-Viable tissue/material. Material removed includes Eschar and Slough and. 1 specimen was taken by a Swab and sent to the lab per facility protocol. A time out was conducted at 11:26, prior to the start of the procedure. A Minimum amount of bleeding was controlled with Pressure. The procedure was tolerated well. Post Debridement Measurements: 11.5cm length x 4.5cm width x 1.4cm depth; 56.902cm^3 volume. Character of Wound/Ulcer Post Debridement is stable. Post procedure Diagnosis Wound #9: Same as Pre-Procedure Plan Follow-up Appointments: Return Appointment in 2 weeks. Home Health: Other Home Health Orders/Instructions: - Home Health Not Available WOUND #1: - Upper Leg Wound Laterality: Right, Medial Amy Moses, Amy Moses ([id number]) Cleanser: Normal Saline 3 x Per Week/30 Days Discharge Instructions: Wash your hands with soap and water. Remove old dressing, discard into plastic bag and place into trash. Cleanse the wound with Normal Saline prior to applying a clean dressing using gauze sponges, not tissues or cotton balls. Do not scrub or use excessive force. Pat dry using gauze sponges, not tissue or cotton balls. Primary Dressing: Hydrofera Blue Ready Transfer Foam, 2.5x2.5 (in/in) 3 x Per Week/30 Days Discharge Instructions: Apply Hydrofera Blue Ready to wound bed as directed Secondary Dressing: ABD Pad 5x9 (in/in) 3 x Per Week/30 Days Discharge Instructions: Cover with ABD pad Secured With: 25M Medipore H Soft Cloth Surgical Tape, 2x2 (in/yd) 3 x Per Week/30 Days WOUND #10: - Calf Wound Laterality: Right, Lateral Cleanser: Dakin 16 (oz) 0.25 1 x Per Day/30 Days Discharge Instructions: Use as  directed. Primary Dressing: Gauze 1 x Per Day/30 Days Discharge Instructions: As directed: dry, moistened with saline or moistened with Dakins Solution Secondary Dressing: ABD Pad 5x9 (in/in) 1 x Per Day/30 Days Discharge Instructions: Cover with ABD pad Secured With: 25M Medipore H Soft Cloth Surgical Tape, 2x2 (in/yd) 1 x Per Day/30 Days Secured With: Conforming Stretch Gauze Bandage 4x75 (in/in) 1 x Per Day/30 Days Discharge Instructions: Apply as directed WOUND #11: - Lower Leg Wound Laterality: Right, Posterior, Distal Cleanser: Dakin 16 (oz) 0.25 1 x Per Day/30 Days Discharge Instructions: Use as directed. Primary Dressing: Gauze 1 x Per Day/30 Days Discharge Instructions: As directed: dry, moistened with saline or moistened with Dakins Solution Secondary Dressing: ABD Pad 5x9 (in/in) 1 x Per Day/30 Days Discharge Instructions: Cover with ABD pad Secured  With: 57M Medipore H Soft Cloth Surgical Tape, 2x2 (in/yd) 1 x Per Day/30 Days Secured With: Conforming Stretch Gauze Bandage 4x75 (in/in) 1 x Per Day/30 Days Discharge Instructions: Apply as directed WOUND #2: - Upper Leg Wound Laterality: Left, Medial Cleanser: Normal Saline 3 x Per Week/30 Days Discharge Instructions: Wash your hands with soap and water. Remove old dressing, discard into plastic bag and place into trash. Cleanse the wound with Normal Saline prior to applying a clean dressing using gauze sponges, not tissues or cotton balls. Do not scrub or use excessive force. Pat dry using gauze sponges, not tissue or cotton balls. Primary Dressing: Hydrofera Blue Ready Transfer Foam, 2.5x2.5 (in/in) 3 x Per Week/30 Days Discharge Instructions: Apply Hydrofera Blue Ready to wound bed as directed Secondary Dressing: ABD Pad 5x9 (in/in) 3 x Per Week/30 Days Discharge Instructions: Cover with ABD pad Secured With: 57M Medipore H Soft Cloth Surgical Tape, 2x2 (in/yd) 3 x Per Week/30 Days WOUND #3: - Upper Leg Wound Laterality: Left,  Lateral Cleanser: Dakin 16 (oz) 0.25 1 x Per Day/30 Days Discharge Instructions: Use as directed. Primary Dressing: Gauze 1 x Per Day/30 Days Discharge Instructions: As directed: dry, moistened with saline or moistened with Dakins Solution Secondary Dressing: ABD Pad 5x9 (in/in) 1 x Per Day/30 Days Discharge Instructions: Cover with ABD pad Secured With: 57M Medipore H Soft Cloth Surgical Tape, 2x2 (in/yd) 1 x Per Day/30 Days Secured With: Conforming Stretch Gauze Bandage 4x75 (in/in) 1 x Per Day/30 Days Discharge Instructions: Apply as directed WOUND #4: - Calcaneus Wound Laterality: Right Primary Dressing: Hydrofera Blue Ready Transfer Foam, 2.5x2.5 (in/in) 1 x Per Day/30 Days Discharge Instructions: Apply Hydrofera Blue Ready to wound bed as directed Secondary Dressing: ABD Pad 5x9 (in/in) 1 x Per Day/30 Days Discharge Instructions: Cover with ABD pad Secured With: 57M Medipore H Soft Cloth Surgical Tape, 2x2 (in/yd) 1 x Per Day/30 Days Secured With: Conforming Stretch Gauze Bandage 4x75 (in/in) 1 x Per Day/30 Days Discharge Instructions: Apply as directed WOUND #9: - Calf Wound Laterality: Left, Lateral Cleanser: Dakin 16 (oz) 0.25 1 x Per Day/30 Days Discharge Instructions: Use as directed. Primary Dressing: Gauze 1 x Per Day/30 Days Discharge Instructions: As directed: dry, moistened with saline or moistened with Dakins Solution Secondary Dressing: ABD Pad 5x9 (in/in) 1 x Per Day/30 Days Discharge Instructions: Cover with ABD pad Secured With: 57M Medipore H Soft Cloth Surgical Tape, 2x2 (in/yd) 1 x Per Day/30 Days Secured With: Conforming Stretch Gauze Bandage 4x75 (in/in) 1 x Per Day/30 Days Discharge Instructions: Apply as directed 1. Would recommend currently that we going continue with the wound care measures as before and the patient is in agreement with plan. This includes the use of the Hydrofera Blue to the thigh ulcers as well as the heel. 2. Also can use Dakin's  moistened gauze to the bilateral lower extremities. 3. I am also can recommend the patient continue to elevate her legs is much as possible. Her daughter is good to be helping to take care of the dressing changes as to be honest she has lost her home health agency and were not likely to get anyone else to take her recall of multiple locations and were not able to find anything. We will see patient back for reevaluation in 2 weeks here in the clinic. If anything worsens or changes patient will contact our office for additional recommendations. Amy Moses, Amy Moses ([id number]) Electronic Signature(s) Signed: 05/21/2021 1:12:54 PM By: Worthy Keeler PA-C Entered By:  Worthy Keeler on 05/21/2021 13:12:53 Amy Moses, Amy Moses ([id number]) -------------------------------------------------------------------------------- SuperBill Details Patient Name: Amy Moses Date of Service: 05/21/2021 Medical Record Number: [id number] Patient Account Number: [id number] Date of Birth/Sex: December 15, 1970 (50 y.o. 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 L97.128 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 L89.610 Pressure ulcer of right heel, unstageable E11.42 Type 2 diabetes mellitus with diabetic polyneuropathy L89.313 Pressure ulcer of right buttock, stage 3 L89.323 Pressure ulcer of left buttock, stage 3 L97.822 Non-pressure chronic ulcer of other part of left lower leg with fat layer exposed L97.812 Non-pressure chronic ulcer of other part of right lower leg with fat layer exposed Facility Procedures CPT4 Code: [id number] Description: [id number] - DEBRIDE WOUND 1ST 20 SQ CM OR  < Modifier: Quantity: 1 CPT4 Code: Description: ICD-10 Diagnosis Description L97.822 Non-pressure chronic ulcer of other part of left lower leg with fat layer L97.812 Non-pressure chronic ulcer of other part of right lower leg with fat layer Modifier: exposed exposed Quantity: CPT4 Code: [id number] Description: [id number] - DEBRIDE WOUND EA ADDL 20 SQ CM Modifier: Quantity: 3 CPT4 Code: Description: ICD-10 Diagnosis Description L97.822 Non-pressure chronic ulcer of other part of left lower leg with fat layer L97.812 Non-pressure chronic ulcer of other part of right lower leg with fat layer Modifier: exposed exposed Quantity: Physician Procedures CPT4 [id number] Description: [id number] - WC PHYS DEBR WO ANESTH 20 SQ CM Modifier: Quantity: 1 CPT4 Code: Description: ICD-10 Diagnosis Description L97.822 Non-pressure chronic ulcer of other part of left lower leg with fat layer L97.812 Non-pressure chronic ulcer of other part of right lower leg with fat layer Modifier: exposed exposed Quantity: CPT4 Code: [id number] Description: [id number] - WC PHYS DEBR WO ANESTH EA ADD 20 CM Modifier: Quantity: 3 CPT4 Code: Description: ICD-10 Diagnosis Description L97.822 Non-pressure chronic ulcer of other part of left lower leg with fat layer L97.812 Non-pressure chronic ulcer of other part of right lower leg with fat layer Modifier: exposed exposed Quantity: Electronic Signature(s) Signed: 05/21/2021 1:13:28 PM By: Worthy Keeler PA-C Entered By: Worthy Keeler on 05/21/2021 13:13:28

## 2021-05-25 LAB — AEROBIC CULTURE W GRAM STAIN (SUPERFICIAL SPECIMEN)

## 2021-06-04 ENCOUNTER — Other Ambulatory Visit: Payer: Self-pay

## 2021-06-04 ENCOUNTER — Encounter: Payer: Medicaid - Out of State | Admitting: Physician Assistant

## 2021-06-04 NOTE — Progress Notes (Addendum)
Amy Moses ([id number]) Visit Report for 06/04/2021 Chief Complaint Document Details Patient Name: Amy Moses, Amy Moses Date of Service: 06/04/2021 11:00 AM Medical Record Number: [id number] Patient Account Number: [id number] Date of Birth/Sex: 1971-02-19 (50 y.o. F) Treating RN: Carlene Coria Primary Care Provider: Zenon Mayo Other Clinician: Referring Provider: Zenon Mayo Treating Provider/Extender: Skipper Cliche in Treatment: 26 Information Obtained from: Patient Chief Complaint 12/02/2020; patient is here for review of extensive wounds on her bilateral thighs as well as an area on her right plantar heel Electronic Signature(s) Signed: 06/04/2021 11:28:53 AM By: Worthy Keeler PA-C Entered By: Worthy Keeler on 06/04/2021 11:28:53 Amy Moses ([id number]) -------------------------------------------------------------------------------- HPI Details Patient Name: Amy Moses Date of Service: 06/04/2021 11:00 AM Medical Record Number: [id number] Patient Account Number: [id number] Date of Birth/Sex: [birth year] (49 y.o. F) Treating RN: Carlene Coria Primary Care Provider: Zenon Mayo Other Clinician: Referring Provider: Zenon Mayo Treating Provider/Extender: Skipper Cliche in Treatment: 26 History of Present Illness HPI Description: ADMISSION 12/02/2020 This is a 50 year old woman who is a type II diabetic. Although there are mentions of type 1 diabetes in epic clearly this woman is type II based on the fact that she was on oral agents for 10 years before starting insulin. She also is in chronic renal failure and recently started on dialysis I think in November. She relates her problems starting in September she started to develop skin excoriation on her bilateral inner thighs. She thought this was a friction phenomenon however the tissue wrist can progressively broke down. She was seen by her primary doctor on 10/21/2020 noted to have erythema on both thighs  medially and a blister. MRIs were ordered and she was put on Silvadene and gauze. She was seen in the ER on 12/12 at the [place] clinic also noted to have a right unstageable heel wound. 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 She dialyzes at [facility] in [facility] and she was started on sodium thiosulfate as far as the notes state. The patient states she gets something at dialysis every day although she has not exactly sure what they are giving her. She was noted by vein and vascular in [place] to have multiple open wounds on 11/19/2020 using Xeroform gauze. She had an angiogram booked by Dr. Oneida Alar predominantly I think because of the right heel ulcer although this was canceled by the patient because of the weather. The patient has large necrotic wounds on both inner thighs with covering black eschar. There is also an area on the left lateral thigh and an eschared area on her right plantar heel. She has been using Betadine to the right heel Xeroform to the areas on her legs. The patient has Medicaid but is able to get the Xeroform, were not really sure how she is managing this although she lives in [place] and there may be different rules for Medicaid in [place] versus [place]. We certainly would not be able to get that here. 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 I wondered whether her neuropathy is particularly affected her sensation of pain since her recent left patella fracture does not seem to have been that painful either Past medical history includes stage V chronic renal failure starting on dialysis I think in November, clearly type 2 diabetes with neuropathy, hypertension, glaucoma, vitamin D deficiency, history of cholecystectomy, edema of both legs, hypothyroidism, she dialyzes Tuesday  Thursday and Saturday at [facility] in  [place] The patient had arterial studies on 11/19/2020 I think at vein and vascular in [place]. She had biphasic waveforms throughout the thigh on the right and at the popliteal proximally and distally in the ATA but the PTA and peroneal were not visualized. On the left again biphasic waveforms up to the level of the distal popliteal but not visualized in the tibial vessels. She was felt to have adequate flow where visualized. As noted she was supposed to have an angiogram which I think is certainly indicated 01/21/3556 upon evaluation today patient actually appears to be doing okay in regard to her wounds. This is actually the first time of seeing her she saw Dr. Dellia Nims at the last visit she does have quite an extensive history based on review. With that being said I do not see any signs right now of active infection which is great news. Overall I am extremely pleased with where things stand in that regard. 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 I really feel like she may do better with Dakin's moistened gauze. I do think that we can see about ordering the Dakin's for her she is already using gauze and then wrapping with roll gauze anyway so really would not change much except for moistening some gauze and applying it to the wound bed. With that being said I do not see any signs of active infection at this time which is great news. The patient all in all seems to be in fairly good spirits all things considered. 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 Fortunately it looks like some of the eschar is loosening up so we should be able to get this removed which hopefully will help in speeding up the healing process. With that being said with regard to the patient's  gluteal region she unfortunately has new pressure ulcerations open today which I think could benefit from possibly having a air mattress. With that being said I explained to the patient that unfortunately this could still take some time as far as getting it to heal completely. There does not appear to be any signs of active infection at this time which is great news. No fevers, chills, nausea, vomiting, or diarrhea. 02/16/2021 upon evaluation today patient appears to be doing about the same in regard to her heel. Her gluteal area is actually getting better. She did have surgery in regard to her thighs bilaterally as well as her knee. It was not until she got into the operating room that it was noted that she had the wounds on the thighs. Subsequently according to the surgeons note dated 02/01/2021 they contacted the patient's daughter in order to discuss the fact they felt she needed to have the wound surgically debrided and a wound VAC placed and subsequently instead of patellar reconstruction they opted to proceed with removal of the Portion of the patella in order to get this area to heal more effectively and quickly. Overall that seems to have done quite well. 02/26/2021 upon evaluation today patient's wounds actually appear to be doing excellent pretty much across the board I am very pleased. With that being said the wounds where she has the wound vacs placed on the thighs in particular seems to be doing a very  good job as far as healing is concerned. There does not appear to be any evidence of infection which is great news and overall I am extremely pleased in that regard. No fevers, chills, nausea, vomiting, or diarrhea. 03/12/21 upon evaluation today patient appears to be doing well with regard to her wounds. I think she is doing excellent with the wound VAC and very pleased in that regard. Fortunately there does not appear to be any signs of infection I am that a try to do a little bit of light  debridement today due to some necrotic regions noted in her wounds to try to help speed up the healing process here. 04/05/2021 upon evaluation today patient appears to be doing decently well in general in regard to her wounds. She was unfortunately in the hospital from the 10th through the 12th of this month due to having high fever and subsequently they did not actually determine exactly what was going on they thought she might of just been sick or had some kind of a cold. With that being said fortunately there does not appear to be any evidence of active infection at this time which is great news. No fevers, chills, nausea, vomiting, or diarrhea. Amy Moses, Amy Moses ([id number]) 05/03/2021 upon evaluation today patient actually appears to be doing excellent for the most part in regard to the wounds on her thighs. I am extremely happy with what I see today. In fact I feel like it is probably time for [name] to discontinue the use of the wound VAC as this appears to be doing so well. The patient voiced an understanding and agreement and is definitely happy to get rid of the wound VAC. She was recently in the hospital but this was secondary to something unrelated to her wound she had a polyp which was bleeding and had to be taken care of this was causing significant issues she has since been taken off of her aspirin. 05/21/2021 upon evaluation today patient appears to be doing well with regard to her wound. She has been tolerating the dressing changes without complication with regard to the [facility] in the upper thigh region. We have been using Dakin's moistened gauze on the legs and the heel. Fortunately everything seems to be making good progress here in general which is great news. The unfortunate thing is that we have been having issues here with some necrotic tissue and drainage on the lateral portions of her legs. Fortunately there does not appear to be any signs of systemic infection although  locally there may be a little bit of infection here based on the drainage which is somewhat blue-green in nature. I am going to perform a culture postdebridement of the right calf/lower extremity region. She tolerated that today without complication. I did perform that culture as well. 06/04/2021 upon evaluation today patient appears to be doing well with regard to the wounds on her thighs in general. Fortunately there does not appear to be any signs of active infection which is great news I am very pleased in that regard. In regard to her lower extremities I do feel like that she is making some good progress here as well. This includes the use of the Dakin's moistened gauze which has done a great job as far as keeping the wounds clean at this point. Overall I am extremely happy with where we stand. Electronic Signature(s) Signed: 06/04/2021 2:45:12 PM By: Worthy Keeler PA-C Entered By: Worthy Keeler on 06/04/2021 14:45:12 Amy Moses ([id number]) --------------------------------------------------------------------------------  Physical Exam Details Patient Name: Amy Moses, Amy Moses Date of Service: 06/04/2021 11:00 AM Medical Record Number: [id number] Patient Account Number: [id number] Date of Birth/Sex: 08/19/1971 (49 y.o. F) Treating RN: Carlene Coria Primary Care Provider: Zenon Mayo Other Clinician: Referring Provider: Zenon Mayo Treating Provider/Extender: Skipper Cliche in Treatment: 60 Constitutional Well-nourished and well-hydrated in no acute distress. Respiratory normal breathing without difficulty. Psychiatric this patient is able to make decisions and demonstrates good insight into disease process. Alert and Oriented x 3. pleasant and cooperative. Notes Upon inspection patient's wound bed actually showed signs again in regard to her thighs of doing quite well there does not appear to be any evidence of infection which is great and overall I am extremely pleased  with where things stand today. No fevers, chills, nausea, vomiting, or diarrhea. Electronic Signature(s) Signed: 06/04/2021 2:47:43 PM By: Worthy Keeler PA-C Entered By: Worthy Keeler on 06/04/2021 14:47:42 Amy Moses ([id number]) -------------------------------------------------------------------------------- Physician Orders Details Patient Name: Amy Moses Date of Service: 06/04/2021 11:00 AM Medical Record Number: [id number] Patient Account Number: [id number] Date of Birth/Sex: 05-01-1971 (49 y.o. F) Treating RN: Carlene Coria Primary Care Provider: Zenon Mayo Other Clinician: Referring Provider: Zenon Mayo Treating Provider/Extender: Skipper Cliche in Treatment: 48 Verbal / Phone Orders: No Diagnosis Coding ICD-10 Coding Code Description E83.59 Other disorders of calcium metabolism L97.118 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 L97.128 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 L89.610 Pressure ulcer of right heel, unstageable E11.42 Type 2 diabetes mellitus with diabetic polyneuropathy L89.313 Pressure ulcer of right buttock, stage 3 L89.323 Pressure ulcer of left buttock, stage 3 L97.822 Non-pressure chronic ulcer of other part of left lower leg with fat layer exposed L97.812 Non-pressure chronic ulcer of other part of right lower leg with fat layer exposed Follow-up Appointments o Return Appointment in 2 weeks. [place] Orders/Instructions: - Home Health Not Available Wound Treatment Wound #1 - Upper Leg Wound Laterality: Right, Medial Cleanser: Normal Saline 3 x Per Week/30 Days Discharge Instructions: Wash your hands with soap and water. Remove old dressing, discard into plastic bag and place into trash. Cleanse the wound with Normal Saline prior to applying a clean dressing using gauze sponges, not tissues or cotton balls. Do not scrub or use excessive force.  Pat dry using gauze sponges, not tissue or cotton balls. Primary Dressing: Hydrofera Blue Ready Transfer Foam, 2.5x2.5 (in/in) 3 x Per Week/30 Days Discharge Instructions: Apply Hydrofera Blue Ready to wound bed as directed Secondary Dressing: ABD Pad 5x9 (in/in) 3 x Per Week/30 Days Discharge Instructions: Cover with ABD pad Secured With: 14M Medipore H Soft Cloth Surgical Tape, 2x2 (in/yd) 3 x Per Week/30 Days Wound #10 - Calf Wound Laterality: Right, Lateral Cleanser: Dakin 16 (oz) 0.25 1 x Per Day/30 Days Discharge Instructions: Use as directed. Primary Dressing: Gauze 1 x Per Day/30 Days Discharge Instructions: As directed: dry, moistened with saline or moistened with Dakins Solution Secondary Dressing: ABD Pad 5x9 (in/in) 1 x Per Day/30 Days Discharge Instructions: Cover with ABD pad Secured With: 14M Medipore H Soft Cloth Surgical Tape, 2x2 (in/yd) 1 x Per Day/30 Days Secured With: Conforming Stretch Gauze Bandage 4x75 (in/in) 1 x Per Day/30 Days Discharge Instructions: Apply as directed Wound #11 - Lower Leg Wound Laterality: Right, Posterior, Distal Cleanser: Dakin 16 (oz) 0.25 1 x Per Day/30 Days Discharge Instructions: Use as directed. Amy Moses, Amy Moses ([id number]) Primary Dressing: Gauze 1 x Per Day/30 Days Discharge Instructions:  As directed: dry, moistened with saline or moistened with Dakins Solution Secondary Dressing: ABD Pad 5x9 (in/in) 1 x Per Day/30 Days Discharge Instructions: Cover with ABD pad Secured With: 52M Medipore H Soft Cloth Surgical Tape, 2x2 (in/yd) 1 x Per Day/30 Days Secured With: Conforming Stretch Gauze Bandage 4x75 (in/in) 1 x Per Day/30 Days Discharge Instructions: Apply as directed Wound #13 - Gluteus Wound Laterality: Right Cleanser: Normal Saline 3 x Per Week/30 Days Discharge Instructions: Wash your hands with soap and water. Remove old dressing, discard into plastic bag and place into trash. Cleanse the wound with Normal Saline prior to applying a  clean dressing using gauze sponges, not tissues or cotton balls. Do not scrub or use excessive force. Pat dry using gauze sponges, not tissue or cotton balls. Primary Dressing: Hydrofera Blue Ready Transfer Foam, 2.5x2.5 (in/in) 3 x Per Week/30 Days Discharge Instructions: Apply Hydrofera Blue Ready to wound bed as directed Secondary Dressing: ABD Pad 5x9 (in/in) 3 x Per Week/30 Days Discharge Instructions: Cover with ABD pad Secured With: 52M Medipore H Soft Cloth Surgical Tape, 2x2 (in/yd) 3 x Per Week/30 Days Wound #14 - Gluteus Wound Laterality: Left Cleanser: Normal Saline 3 x Per Week/30 Days Discharge Instructions: Wash your hands with soap and water. Remove old dressing, discard into plastic bag and place into trash. Cleanse the wound with Normal Saline prior to applying a clean dressing using gauze sponges, not tissues or cotton balls. Do not scrub or use excessive force. Pat dry using gauze sponges, not tissue or cotton balls. Primary Dressing: Hydrofera Blue Ready Transfer Foam, 2.5x2.5 (in/in) 3 x Per Week/30 Days Discharge Instructions: Apply Hydrofera Blue Ready to wound bed as directed Secondary Dressing: ABD Pad 5x9 (in/in) 3 x Per Week/30 Days Discharge Instructions: Cover with ABD pad Secured With: 52M Medipore H Soft Cloth Surgical Tape, 2x2 (in/yd) 3 x Per Week/30 Days Wound #2 - Upper Leg Wound Laterality: Left, Medial Cleanser: Normal Saline 3 x Per Week/30 Days Discharge Instructions: Wash your hands with soap and water. Remove old dressing, discard into plastic bag and place into trash. Cleanse the wound with Normal Saline prior to applying a clean dressing using gauze sponges, not tissues or cotton balls. Do not scrub or use excessive force. Pat dry using gauze sponges, not tissue or cotton balls. Primary Dressing: Hydrofera Blue Ready Transfer Foam, 2.5x2.5 (in/in) 3 x Per Week/30 Days Discharge Instructions: Apply Hydrofera Blue Ready to wound bed as  directed Secondary Dressing: ABD Pad 5x9 (in/in) 3 x Per Week/30 Days Discharge Instructions: Cover with ABD pad Secured With: 52M Medipore H Soft Cloth Surgical Tape, 2x2 (in/yd) 3 x Per Week/30 Days Wound #3 - Upper Leg Wound Laterality: Left, Lateral Cleanser: Dakin 16 (oz) 0.25 1 x Per Day/30 Days Discharge Instructions: Use as directed. Primary Dressing: Gauze 1 x Per Day/30 Days Discharge Instructions: As directed: dry, moistened with saline or moistened with Dakins Solution Secondary Dressing: ABD Pad 5x9 (in/in) 1 x Per Day/30 Days Discharge Instructions: Cover with ABD pad Secured With: 52M Medipore H Soft Cloth Surgical Tape, 2x2 (in/yd) 1 x Per Day/30 Days Amy Moses, Amy Moses ([id number]) Secured With: Conforming Stretch Gauze Bandage 4x75 (in/in) 1 x Per Day/30 Days Discharge Instructions: Apply as directed Wound #4 - Calcaneus Wound Laterality: Right Primary Dressing: Hydrofera Blue Ready Transfer Foam, 2.5x2.5 (in/in) 1 x Per Day/30 Days Discharge Instructions: Apply Hydrofera Blue Ready to wound bed as directed Secondary Dressing: ABD Pad 5x9 (in/in) 1 x Per Day/30 Days Discharge Instructions:  Cover with ABD pad Secured With: 26M Medipore H Soft Cloth Surgical Tape, 2x2 (in/yd) 1 x Per Day/30 Days Secured With: Conforming Stretch Gauze Bandage 4x75 (in/in) 1 x Per Day/30 Days Discharge Instructions: Apply as directed Wound #9 - Calf Wound Laterality: Left, Lateral Cleanser: Dakin 16 (oz) 0.25 1 x Per Day/30 Days Discharge Instructions: Use as directed. Primary Dressing: Gauze 1 x Per Day/30 Days Discharge Instructions: As directed: dry, moistened with saline or moistened with Dakins Solution Secondary Dressing: ABD Pad 5x9 (in/in) 1 x Per Day/30 Days Discharge Instructions: Cover with ABD pad Secured With: 26M Medipore H Soft Cloth Surgical Tape, 2x2 (in/yd) 1 x Per Day/30 Days Secured With: Conforming Stretch Gauze Bandage 4x75 (in/in) 1 x Per Day/30 Days Discharge  Instructions: Apply as directed Electronic Signature(s) Signed: 06/04/2021 4:42:29 PM By: Worthy Keeler PA-C Signed: 06/07/2021 2:11:15 PM By: Carlene Coria RN Entered By: Carlene Coria on 06/04/2021 14:51:49 Amy Moses ([id number]) -------------------------------------------------------------------------------- Problem List Details Patient Name: Amy Moses Date of Service: 06/04/2021 11:00 AM Medical Record Number: [id number] Patient Account Number: [id number] Date of Birth/Sex: August 13, 1971 (49 y.o. F) Treating RN: Carlene Coria Primary Care Provider: Zenon Mayo Other Clinician: Referring Provider: Zenon Mayo Treating Provider/Extender: Skipper Cliche in Treatment: 26 Active Problems ICD-10 Encounter Code Description Active Date MDM Diagnosis E83.59 Other disorders of calcium metabolism 12/02/2020 No Yes L97.118 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 12/02/2020 No Yes L97.128 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 12/02/2020 No Yes L89.610 Pressure ulcer of right heel, unstageable 12/02/2020 No Yes E11.42 Type 2 diabetes mellitus with diabetic polyneuropathy 12/02/2020 No Yes L89.313 Pressure ulcer of right buttock, stage 3 01/19/2021 No Yes L89.323 Pressure ulcer of left buttock, stage 3 01/19/2021 No Yes L97.822 Non-pressure chronic ulcer of other part of left lower leg with fat layer 05/03/2021 No Yes exposed L97.812 Non-pressure chronic ulcer of other part of right lower leg with fat layer 05/03/2021 No Yes exposed Inactive Problems Resolved Problems Electronic Signature(s) Signed: 06/04/2021 11:28:47 AM By: Worthy Keeler PA-C Entered By: Worthy Keeler on 06/04/2021 11:28:46 Amy Moses ([id number]) -------------------------------------------------------------------------------- Progress Note Details Patient Name: Amy Moses Date of Service: 06/04/2021 11:00 AM Medical Record Number:  [id number] Patient Account Number: [id number] Date of Birth/Sex: Oct 01, 1971 (49 y.o. F) Treating RN: Carlene Coria Primary Care Provider: Zenon Mayo Other Clinician: Referring Provider: Zenon Mayo Treating Provider/Extender: Skipper Cliche in Treatment: 26 Subjective Chief Complaint Information obtained from Patient 12/02/2020; patient is here for review of extensive wounds on her bilateral thighs as well as an area on her right plantar heel History of Present Illness (HPI) ADMISSION 12/02/2020 This is a 50 year old woman who is a type II diabetic. Although there are mentions of type 1 diabetes in epic clearly this woman is type II based on the fact that she was on oral agents for 10 years before starting insulin. She also is in chronic renal failure and recently started on dialysis I think in November. She relates her problems starting in September she started to develop skin excoriation on her bilateral inner thighs. She thought this was a friction phenomenon however the tissue wrist can progressively broke down. She was seen by her primary doctor on 10/21/2020 noted to have erythema on both thighs medially and a blister. MRIs were ordered and she was put on Silvadene and gauze. She was seen in the ER on 12/12 at the [place] clinic also noted to have a right unstageable heel  wound. 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 She dialyzes at [place] in [facility] and she was started on sodium thiosulfate as far as the notes state. The patient states she gets something at dialysis every day although she has not exactly sure what they are giving her. She was noted by vein and vascular in [place] to have multiple open wounds on 11/19/2020 using Xeroform gauze. She had an angiogram booked by Dr. Oneida Alar predominantly I think because of the right heel ulcer although this was canceled by the patient because of the weather. The  patient has large necrotic wounds on both inner thighs with covering black eschar. There is also an area on the left lateral thigh and an eschared area on her right plantar heel. She has been using Betadine to the right heel Xeroform to the areas on her legs. The patient has Medicaid but is able to get the Xeroform, were not really sure how she is managing this although she lives in [place] and there may be different rules for Medicaid in [place] versus [place]. We certainly would not be able to get that here. 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 I wondered whether her neuropathy is particularly affected her sensation of pain since her recent left patella fracture does not seem to have been that painful either Past medical history includes stage V chronic renal failure starting on dialysis I think in November, clearly type 2 diabetes with neuropathy, hypertension, glaucoma, vitamin D deficiency, history of cholecystectomy, edema of both legs, hypothyroidism, she dialyzes Tuesday Thursday and Saturday at [facility] in [place] The patient had arterial studies on 11/19/2020 [facility] think at [facility] in [place]. She had biphasic waveforms throughout the thigh on the right and at the popliteal proximally and distally in the ATA but the PTA and peroneal were not visualized. On the left again biphasic waveforms up to the level of the distal popliteal but not visualized in the tibial vessels. She was felt to have adequate flow where visualized. As noted she was supposed to have an angiogram which I think is certainly indicated [id number] upon evaluation today patient actually appears to be doing okay in regard to her wounds. This is actually the first time of seeing her she saw Dr. Dellia Nims at the last visit she does have quite an extensive history based on review. With that being said I do not see any  signs right now of active infection which is great news. Overall I am extremely pleased with where things stand in that regard. 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 I really feel like she may do better with Dakin's moistened gauze. I do think that we can see about ordering the Dakin's for her she is already using gauze and then wrapping with roll gauze anyway so really would not change much except for moistening some gauze and applying it to the wound bed. With that being said I do not see any signs of active infection at this time which is great news. The patient all in all seems to be in fairly good spirits all things considered. 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 Fortunately  it looks like some of the eschar is loosening up so we should be able to get this removed which hopefully will help in speeding up the healing process. With that being said with regard to the patient's gluteal region she unfortunately has new pressure ulcerations open today which I think could benefit from possibly having a air mattress. With that being said I explained to the patient that unfortunately this could still take some time as far as getting it to heal completely. There does not appear to be any signs of active infection at this time which is great news. No fevers, chills, nausea, vomiting, or diarrhea. 02/16/2021 upon evaluation today patient appears to be doing about the same in regard to her heel. Her gluteal area is actually getting better. She did have surgery in regard to her thighs bilaterally as well as her knee. It was not until she got into the operating room that it was noted that she had the wounds on the thighs. Subsequently according to the surgeons note dated  02/01/2021 they contacted the patient's daughter in order to discuss the fact they felt she needed to have the wound surgically debrided and a wound VAC placed and subsequently instead of patellar reconstruction they opted to proceed with removal of the Portion of the patella in order to get this area to heal more effectively and quickly. Overall that seems to have done quite well. 02/26/2021 upon evaluation today patient's wounds actually appear to be doing excellent pretty much across the board I am very pleased. With that being said the wounds where she has the wound vacs placed on the thighs in particular seems to be doing a very good job as far as healing is concerned. There does not appear to be any evidence of infection which is great news and overall I am extremely pleased in that regard. No fevers, chills, nausea, vomiting, or diarrhea. 03/12/21 upon evaluation today patient appears to be doing well with regard to her wounds. I think she is doing excellent with the wound VAC and very pleased in that regard. Fortunately there does not appear to be any signs of infection I am that a try to do a little bit of light debridement today due to some necrotic regions noted in her wounds to try to help speed up the healing process here. Amy Moses, Amy Moses ([id number]) 04/05/2021 upon evaluation today patient appears to be doing decently well in general in regard to her wounds. She was unfortunately in the hospital from the 10th through the 12th of this month due to having high fever and subsequently they did not actually determine exactly what was going on they thought she might of just been sick or had some kind of a cold. With that being said fortunately there does not appear to be any evidence of active infection at this time which is great news. No fevers, chills, nausea, vomiting, or diarrhea. 05/03/2021 upon evaluation today patient actually appears to be doing excellent for the most part in regard to  the wounds on her thighs. I am extremely happy with what I see today. In fact I feel like it is probably time for [name] to discontinue the use of the wound VAC as this appears to be doing so well. The patient voiced an understanding and agreement and is definitely happy to get rid of the wound VAC. She was recently in the hospital but this was secondary to something unrelated to her wound she had a polyp which was bleeding  and had to be taken care of this was causing significant issues she has since been taken off of her aspirin. 05/21/2021 upon evaluation today patient appears to be doing well with regard to her wound. She has been tolerating the dressing changes without complication with regard to the [facility] in the upper thigh region. We have been using Dakin's moistened gauze on the legs and the heel. Fortunately everything seems to be making good progress here in general which is great news. The unfortunate thing is that we have been having issues here with some necrotic tissue and drainage on the lateral portions of her legs. Fortunately there does not appear to be any signs of systemic infection although locally there may be a little bit of infection here based on the drainage which is somewhat blue-green in nature. I am going to perform a culture postdebridement of the right calf/lower extremity region. She tolerated that today without complication. I did perform that culture as well. 06/04/2021 upon evaluation today patient appears to be doing well with regard to the wounds on her thighs in general. Fortunately there does not appear to be any signs of active infection which is great news I am very pleased in that regard. In regard to her lower extremities I do feel like that she is making some good progress here as well. This includes the use of the Dakin's moistened gauze which has done a great job as far as keeping the wounds clean at this point. Overall I am extremely happy with where we  stand. Objective Constitutional Well-nourished and well-hydrated in no acute distress. Vitals Time Taken: 11:19 AM, Height: 66 in, Weight: 235 lbs, BMI: 37.9, Temperature: 98.4 F, Pulse: 81 bpm, Respiratory Rate: 18 breaths/min, Blood Pressure: 148/83 mmHg. Respiratory normal breathing without difficulty. Psychiatric this patient is able to make decisions and demonstrates good insight into disease process. Alert and Oriented x 3. pleasant and cooperative. General Notes: Upon inspection patient's wound bed actually showed signs again in regard to her thighs of doing quite well there does not appear to be any evidence of infection which is great and overall I am extremely pleased with where things stand today. No fevers, chills, nausea, vomiting, or diarrhea. Integumentary (Hair, Skin) Wound #1 status is Open. Original cause of wound was Gradually Appeared. The date acquired was: 07/22/2020. The wound has been in treatment 26 weeks. The wound is located on the Right,Medial Upper Leg. The wound measures 0cm length x 0cm width x 0cm depth; 0cm^2 area and 0cm^3 volume. There is no tunneling or undermining noted. There is a none present amount of drainage noted. There is no granulation within the wound bed. There is no necrotic tissue within the wound bed. Wound #10 status is Open. Original cause of wound was Gradually Appeared. The date acquired was: 02/16/2021. The wound has been in treatment 15 weeks. The wound is located on the Right,Lateral Calf. The wound measures 1.9cm length x 0.9cm width x 0.5cm depth; 1.343cm^2 area and 0.672cm^3 volume. There is Fat Layer (Subcutaneous Tissue) exposed. There is no tunneling or undermining noted. There is a medium amount of serosanguineous drainage noted. Foul odor after cleansing was noted. There is small (1-33%) pink granulation within the wound bed. There is a large (67-100%) amount of necrotic tissue within the wound bed including Eschar and Adherent  Slough. Wound #11 status is Open. Original cause of wound was Gradually Appeared. The date acquired was: 03/21/2021. The wound has been in treatment 8 weeks. The  wound is located on the Right,Distal,Posterior Lower Leg. The wound measures 8.7cm length x 6cm width x 0.5cm depth; 40.998cm^2 area and 20.499cm^3 volume. There is Fat Layer (Subcutaneous Tissue) exposed. There is no tunneling or undermining noted. There is a medium amount of serosanguineous drainage noted. There is medium (34-66%) red granulation within the wound bed. There is a medium (34-66%) amount of necrotic tissue within the wound bed including Eschar and Adherent Slough. Wound #13 status is Open. Original cause of wound was Gradually Appeared. The date acquired was: 05/31/2021. The wound is located on the Left Gluteus. The wound measures 1.3cm length x 2cm width x 0.1cm depth; 2.042cm^2 area and 0.204cm^3 volume. There is Fat Layer (Subcutaneous Tissue) exposed. There is no tunneling or undermining noted. There is a medium amount of serosanguineous drainage noted. There is large (67-100%) red granulation within the wound bed. There is no necrotic tissue within the wound bed. Wound #14 status is Open. Original cause of wound was Gradually Appeared. The date acquired was: 05/28/2021. The wound is located on the Left Gluteus. The wound measures 1.5cm length x 2.3cm width x 0.1cm depth; 2.71cm^2 area and 0.271cm^3 volume. There is Fat Amy Moses, Amy Moses ([id number]) (Subcutaneous Tissue) exposed. There is no tunneling or undermining noted. There is a medium amount of serosanguineous drainage noted. There is large (67-100%) red granulation within the wound bed. There is a small (1-33%) amount of necrotic tissue within the wound bed including Eschar. Wound #2 status is Open. Original cause of wound was Gradually Appeared. The date acquired was: 07/22/2020. The wound has been in treatment 26 weeks. The wound is located on the Left,Medial  Upper Leg. The wound measures 1cm length x 1.5cm width x 0.1cm depth; 1.178cm^2 area and 0.118cm^3 volume. There is Fat Layer (Subcutaneous Tissue) exposed. There is no tunneling or undermining noted. There is a medium amount of serosanguineous drainage noted. There is large (67-100%) red granulation within the wound bed. There is a small (1-33%) amount of necrotic tissue within the wound bed including Adherent Slough. Wound #3 status is Open. Original cause of wound was Gradually Appeared. The date acquired was: 07/22/2020. The wound has been in treatment 26 weeks. The wound is located on the Left,Lateral Upper Leg. The wound measures 2.5cm length x 2cm width x 0.1cm depth; 3.927cm^2 area and 0.393cm^3 volume. There is Fat Layer (Subcutaneous Tissue) exposed. There is no tunneling or undermining noted. There is a medium amount of serosanguineous drainage noted. There is medium (34-66%) granulation within the wound bed. There is a medium (34-66%) amount of necrotic tissue within the wound bed including Adherent Slough. Wound #4 status is Open. Original cause of wound was Gradually Appeared. The date acquired was: 10/21/2020. The wound has been in treatment 26 weeks. The wound is located on the Right Calcaneus. The wound measures 2.5cm length x 3cm width x 0.3cm depth; 5.89cm^2 area and 1.767cm^3 volume. There is Fat Layer (Subcutaneous Tissue) exposed. There is no tunneling or undermining noted. There is a large amount of serosanguineous drainage noted. There is medium (34-66%) red granulation within the wound bed. There is a medium (34-66%) amount of necrotic tissue within the wound bed including Adherent Slough. Wound #9 status is Open. Original cause of wound was Gradually Appeared. The date acquired was: 02/16/2021. The wound has been in treatment 15 weeks. The wound is located on the Left,Lateral Calf. The wound measures 11.6cm length x 6.5cm width x 1.4cm depth; 59.219cm^2 area and 82.907cm^3  volume. There is tendon, Fat  Layer (Subcutaneous Tissue), and fascia exposed. There is no tunneling or undermining noted. There is a medium amount of serosanguineous drainage noted. Foul odor after cleansing was noted. There is small (1-33%) pink granulation within the wound bed. There is a large (67-100%) amount of necrotic tissue within the wound bed including Eschar and Adherent Slough. 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 Home Health: Other Home Health Orders/Instructions: - Home Health Not Available WOUND #1: - Upper Leg Wound Laterality: Right, Medial Cleanser: Normal Saline 3 x Per Week/30 Days Discharge Instructions: Wash your hands with soap and water. Remove old dressing, discard into plastic bag and place into trash. Cleanse the wound with Normal Saline prior to applying a clean dressing using gauze sponges, not tissues or cotton balls. Do not scrub or use excessive force. Pat dry using gauze sponges, not tissue or cotton balls. Primary Dressing: Hydrofera Blue Ready Transfer Foam, 2.5x2.5 (in/in) 3 x Per Week/30 Days Discharge Instructions: Apply Hydrofera Blue Ready to wound bed as directed Secondary Dressing: ABD Pad 5x9 (in/in) 3 x Per Week/30 Days Discharge Instructions: Cover with ABD pad Secured With: 57M Medipore H Soft Cloth Surgical Tape, 2x2 (in/yd) 3 x Per Week/30 Days WOUND #10: - Calf Wound  Laterality: Right, Lateral Cleanser: Dakin 16 (oz) 0.25 1 x Per Day/30 Days Discharge Instructions: Use as directed. Primary Dressing: Gauze 1 x Per Day/30 Days Discharge Instructions: As directed: dry, moistened with saline or moistened with Dakins Solution Secondary Dressing: ABD Pad 5x9 (in/in) 1 x Per Day/30 Days Discharge Instructions: Cover with ABD pad Secured With: 57M Medipore H Soft Cloth Surgical Tape, 2x2 (in/yd) 1 x Per Day/30 Days Secured With: Conforming Stretch Gauze Bandage 4x75 (in/in) 1 x Per Day/30 Days Discharge Instructions: Apply as directed Amy Moses, Amy Moses ([id number]) WOUND #11: - Lower Leg Wound Laterality: Right, Posterior, Distal Cleanser: Dakin 16 (oz) 0.25 1 x Per Day/30 Days Discharge Instructions: Use as directed. Primary Dressing: Gauze 1 x Per Day/30 Days Discharge Instructions: As directed: dry, moistened with saline or moistened with Dakins Solution Secondary Dressing: ABD Pad 5x9 (in/in) 1 x Per Day/30 Days Discharge Instructions: Cover with ABD pad Secured With: 57M Medipore H Soft Cloth Surgical Tape, 2x2 (in/yd) 1 x Per Day/30 Days Secured With: Conforming Stretch Gauze Bandage 4x75 (in/in) 1 x Per Day/30 Days Discharge Instructions: Apply as directed WOUND #13: - Gluteus Wound Laterality: Right Cleanser: Normal Saline 3 x Per Week/30 Days Discharge Instructions: Wash your hands with soap and water. Remove old dressing, discard into plastic bag and place into trash. Cleanse the wound with Normal Saline prior to applying a clean dressing using gauze sponges, not tissues or cotton balls. Do not scrub or use excessive force. Pat dry using gauze sponges, not tissue or cotton balls. Primary Dressing: Hydrofera Blue Ready Transfer Foam, 2.5x2.5 (in/in) 3 x Per Week/30 Days Discharge Instructions: Apply Hydrofera Blue Ready to wound bed as directed Secondary Dressing: ABD Pad 5x9 (in/in) 3 x Per Week/30 Days Discharge Instructions: Cover with ABD  pad Secured With: 108M Medipore H Soft Cloth Surgical Tape, 2x2 (in/yd) 3 x Per Week/30 Days WOUND #14: - Gluteus Wound Laterality: Left Cleanser: Normal Saline 3 x Per Week/30 Days Discharge Instructions: Wash your hands with soap and water. Remove old dressing, discard into plastic bag and place into trash. Cleanse the wound with Normal Saline prior to applying a clean dressing using gauze sponges, not tissues or cotton balls. Do not scrub or use excessive force. Pat dry using gauze sponges, not tissue or cotton balls. Primary Dressing: Hydrofera Blue Ready Transfer Foam, 2.5x2.5 (in/in) 3 x Per Week/30 Days Discharge Instructions: Apply Hydrofera Blue Ready to wound bed as directed Secondary Dressing: ABD Pad 5x9 (in/in) 3 x Per Week/30 Days Discharge Instructions: Cover with ABD pad Secured With: 108M Medipore H Soft Cloth Surgical Tape, 2x2 (in/yd) 3 x Per Week/30 Days WOUND #2: - Upper Leg Wound Laterality: Left, Medial Cleanser: Normal Saline 3 x Per Week/30 Days Discharge Instructions: Wash your hands with soap and water. Remove old dressing, discard into plastic bag and place into trash. Cleanse the wound with Normal Saline prior to applying a clean dressing using gauze sponges, not tissues or cotton balls. Do not scrub or use excessive force. Pat dry using gauze sponges, not tissue or cotton balls. Primary Dressing: Hydrofera Blue Ready Transfer Foam, 2.5x2.5 (in/in) 3 x Per Week/30 Days Discharge Instructions: Apply Hydrofera Blue Ready to wound bed as directed Secondary Dressing: ABD Pad 5x9 (in/in) 3 x Per Week/30 Days Discharge Instructions: Cover with ABD pad Secured With: 108M Medipore H Soft Cloth Surgical Tape, 2x2 (in/yd) 3 x Per Week/30 Days WOUND #3: - Upper Leg Wound Laterality: Left, Lateral Cleanser: Dakin 16 (oz) 0.25 1 x Per Day/30 Days Discharge Instructions: Use as directed. Primary Dressing: Gauze 1 x Per Day/30 Days Discharge Instructions: As directed: dry,  moistened with saline or moistened with Dakins Solution Secondary Dressing: ABD Pad 5x9 (in/in) 1 x Per Day/30 Days Discharge Instructions: Cover with ABD pad Secured With: 108M Medipore H Soft Cloth Surgical Tape, 2x2 (in/yd) 1 x Per Day/30 Days Secured With: Conforming Stretch Gauze Bandage 4x75 (in/in) 1 x Per Day/30 Days Discharge Instructions: Apply as directed WOUND #4: - Calcaneus Wound Laterality: Right Primary Dressing: Hydrofera Blue Ready Transfer Foam, 2.5x2.5 (in/in) 1 x Per Day/30 Days Discharge Instructions: Apply Hydrofera Blue Ready to wound bed as directed Secondary Dressing: ABD Pad 5x9 (in/in) 1 x Per Day/30 Days Discharge Instructions: Cover with ABD pad Secured With: 108M Medipore H Soft Cloth Surgical Tape, 2x2 (in/yd) 1 x Per Day/30 Days Secured With: Conforming Stretch Gauze Bandage 4x75 (in/in) 1 x Per Day/30 Days Discharge Instructions: Apply as directed WOUND #9: - Calf Wound Laterality: Left, Lateral Cleanser: Dakin 16 (oz) 0.25 1 x Per Day/30 Days Discharge Instructions: Use as directed. Primary Dressing: Gauze 1 x Per Day/30 Days Discharge Instructions: As directed: dry, moistened with saline or moistened with Dakins Solution Secondary Dressing: ABD Pad 5x9 (in/in) 1 x Per Day/30 Days Discharge Instructions: Cover with ABD pad Secured With: 108M Medipore H Soft Cloth Surgical Tape, 2x2 (in/yd) 1 x Per Day/30 Days Secured With: Conforming Stretch Gauze Bandage 4x75 (in/in) 1 x Per Day/30 Days Discharge Instructions: Apply as directed 1. Would recommend currently that we go ahead and continue with the [facility] Blue to the heel as well as the wounds on the gluteal region as well as the left thigh where it still open although this is almost completely closed. 2. I am also  can recommend that we continue with the Dakin's moistened gauze to the legs bilaterally. 3. I am also can recommend the patient continue to keep pressure off of her wound is much as possible as far  as offloading is concerned. We will see patient back for reevaluation in 1 week here in the clinic. If anything worsens or changes patient will contact our office for additional recommendations. Amy Moses, Amy Moses ([id number]) Electronic Signature(s) Signed: 06/04/2021 2:54:22 PM By: Worthy Keeler PA-C Previous Signature: 06/04/2021 2:48:55 PM Version By: Worthy Keeler PA-C Entered By: Worthy Keeler on 06/04/2021 14:54:22 Amy Moses ([id number]) -------------------------------------------------------------------------------- SuperBill Details Patient Name: Amy Moses Date of Service: 06/04/2021 Medical Record Number: [id number] Patient Account Number: [id number] Date of Birth/Sex: [birth year] (49 y.o. 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 L89.610 Pressure ulcer of right heel, unstageable E11.42 Type 2 diabetes mellitus with diabetic polyneuropathy L89.313 Pressure ulcer of right buttock, stage 3 L89.323 Pressure ulcer of left buttock, stage 3 L97.822 Non-pressure chronic ulcer of other part of left lower leg with fat layer exposed L97.812 Non-pressure chronic ulcer of other part of right lower leg with fat layer exposed Physician Procedures CPT4 Code: [id number] Description: 99214 - WC PHYS LEVEL 4 - EST PT Modifier: Quantity: 1 CPT4 Code: Description: ICD-10 Diagnosis Description 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

## 2021-06-07 NOTE — Progress Notes (Signed)
JOYCEANN, KRUSER ([id number]) Visit Report for 05/21/2021 Arrival Information Details Patient Name: Amy Moses Date of Service: 05/21/2021 10:45 AM Medical Record Number: [id number] Patient Account Number: [id number] Date of Birth/Sex: Jan 30, 1971 (50 y.o. F) Treating RN: Cornell Barman Primary Care Oriyah Lamphear: Zenon Mayo Other Clinician: Referring Emmagrace Runkel: Zenon Mayo Treating Saleena Tamas/Extender: Skipper Cliche in Treatment: 24 Visit Information History Since Last Visit Added or deleted any medications: Yes Patient Arrived: Stretcher Pain Present Now: No Arrival Time: 10:33 Accompanied By: EMS Transfer Assistance: Stretcher Patient Identification Verified: Yes Secondary Verification Process Completed: Yes Patient Requires Transmission-Based No Precautions: Patient Has Alerts: Yes Patient Alerts: Patient on Blood Thinner ASPIRIN Electronic Signature(s) Signed: 06/07/2021 1:36:15 PM By: Gretta Cool, BSN, RN, CWS, Kim RN, BSN Entered By: Gretta Cool, BSN, RN, CWS, Kim on 05/21/2021 10:34:18 Amy Moses ([id number]) -------------------------------------------------------------------------------- Lower Extremity Assessment Details Patient Name: Amy Moses Date of Service: 05/21/2021 10:45 AM Medical Record Number: [id number] Patient Account Number: [id number] Date of Birth/Sex: 1970-12-20 (49 y.o. F) Treating RN: Cornell Barman Primary Care Casin Federici: Zenon Mayo Other Clinician: Referring Fernie Grimm: Zenon Mayo Treating Donnis Pecha/Extender: Skipper Cliche in Treatment: 24 Edema Assessment Assessed: [Left: Yes] [Right: Yes] Edema: [Left: Yes] [Right: Yes] Calf Left: Right: Point of Measurement: 30 cm From Medial Instep 45.5 cm 39 cm Ankle Left: Right: Point of Measurement: 12 cm From Medial Instep 26.2 cm 25.5 cm Vascular Assessment Pulses: Dorsalis Pedis Palpable: [Left:Yes] [Right:Yes] Electronic Signature(s) Signed: 06/07/2021 1:36:15 PM By: Gretta Cool, BSN, RN, CWS,  Kim RN, BSN Entered By: Gretta Cool, BSN, RN, CWS, Kim on 05/21/2021 11:07:08 Amy Moses ([id number]) -------------------------------------------------------------------------------- Multi Wound Chart Details Patient Name: Amy Moses Date of Service: 05/21/2021 10:45 AM Medical Record Number: [id number] Patient Account Number: [id number] Date of Birth/Sex: May 29, 1971 (49 y.o. F) Treating RN: Cornell Barman Primary Care Trestin Vences: Zenon Mayo Other Clinician: Referring Yazir Koerber: Zenon Mayo Treating Shawnita Krizek/Extender: Skipper Cliche in Treatment: 24 Vital Signs Height(in): 57 Pulse(bpm): 76 Weight(lbs): 235 Blood Pressure(mmHg): 153/78 Body Mass Index(BMI): 38 Temperature(F): 98 Respiratory Rate(breaths/min): 16 Photos: Wound Location: Right, Medial Upper Leg Right, Lateral Calf Right, Distal, Posterior Lower Leg Wounding Event: Gradually Appeared Gradually Appeared Gradually Appeared Primary Etiology: Calciphylaxis Calciphylaxis Calciphylaxis Comorbid History: Cataracts, Glaucoma, Anemia, Cataracts, Glaucoma, Anemia, Cataracts, Glaucoma, Anemia, Hypertension, Type II Diabetes, End Hypertension, Type II Diabetes, End Hypertension, Type II Diabetes, End Stage Renal Disease, Neuropathy Stage Renal Disease, Neuropathy Stage Renal Disease, Neuropathy Date Acquired: 07/22/2020 02/16/2021 03/21/2021 Weeks of Treatment: 24 13 6  Wound Status: Open Open Open Measurements L x W x D (cm) 5x1.5x0.1 1.9x0.9x0.7 8.1x5.6x0.6 Area (cm) : 5.89 1.343 35.626 Volume (cm) : 0.589 0.94 21.375 % Reduction in Area: 97.40% 37.80% -807.20% % Reduction in Volume: 97.40% -335.20% -5338.90% Classification: Full Thickness Without Exposed Full Thickness Without Exposed Full Thickness Without Exposed Support Structures Support Structures Support Structures Exudate Amount: Medium Medium Large Exudate Type: Serous Serosanguineous Serosanguineous Exudate Color: amber red, brown red, brown Foul Odor After  Cleansing: No Yes Yes Odor Anticipated Due to Product N/A No No Use: Granulation Amount: Medium (34-66%) Small (1-33%) Small (1-33%) Granulation Quality: Red Pink Red Necrotic Amount: Small (1-33%) Large (67-100%) Large (67-100%) Necrotic Tissue: Adherent [place] Exposed Structures: Fat Layer (Subcutaneous Tissue): Fat Layer (Subcutaneous Tissue): Fat Layer (Subcutaneous Tissue): Yes Yes Yes Fascia: No Fascia: No Fascia: No Tendon: No Tendon: No Tendon: No Muscle: No Muscle: No Muscle: No Joint: No Joint: No Joint: No Bone: No Bone: No Bone: No Epithelialization: Small (1-33%) None None Assessment Notes: N/A N/A N/A Wound  Number: 12 2 3  Photos: No Photos [name], [name] ([id number]) Wound Location: Left, Lateral Calcaneus Left, Medial Upper Leg Left, Lateral Upper Leg Wounding Event: Gradually Appeared Gradually Appeared Gradually Appeared Primary Etiology: Pressure Ulcer Calciphylaxis Calciphylaxis Comorbid History: N/A Cataracts, Glaucoma, Anemia, Cataracts, Glaucoma, Anemia, Hypertension, Type II Diabetes, End Hypertension, Type II Diabetes, End Stage Renal Disease, Neuropathy Stage Renal Disease, Neuropathy Date Acquired: 04/21/2021 07/22/2020 07/22/2020 Weeks of Treatment: 2 24 24  Wound Status: Healed - Epithelialized Open Open Measurements L x W x D (cm) 0x0x0 8.7x2.5x0.2 2.6x2.1x0.2 Area (cm) : 0 17.082 4.288 Volume (cm) : 0 3.416 0.858 % Reduction in Area: 100.00% 86.10% 82.90% % Reduction in Volume: 100.00% 72.10% 65.90% Classification: Unstageable/Unclassified Full Thickness Without Exposed Full Thickness Without Exposed Support Structures Support Structures Exudate Amount: N/A Medium Medium Exudate Type: N/A Serosanguineous Serosanguineous Exudate Color: N/A red, brown red, brown Foul Odor After Cleansing: N/A No No Odor Anticipated Due to Product N/A N/A N/A Use: Granulation Amount: N/A Large (67-100%) Large  (67-100%) Granulation Quality: N/A Red Red Necrotic Amount: N/A Small (1-33%) Small (1-33%) Necrotic Tissue: N/A Adherent [place] Exposed Structures: N/A Fat Layer (Subcutaneous Tissue): Fat Layer (Subcutaneous Tissue): Yes Yes Fascia: No Fascia: No Tendon: No Tendon: No Muscle: No Muscle: No Joint: No Joint: No Bone: No Bone: No Epithelialization: N/A Small (1-33%) Small (1-33%) Assessment Notes: N/A N/A N/A Wound Number: 4 9 N/A Photos: N/A Wound Location: Right Calcaneus Left, Lateral Calf N/A Wounding Event: Gradually Appeared Gradually Appeared N/A Primary Etiology: Pressure Ulcer Calciphylaxis N/A Comorbid History: Cataracts, Glaucoma, Anemia, Cataracts, Glaucoma, Anemia, N/A Hypertension, Type II Diabetes, End Hypertension, Type II Diabetes, End Stage Renal Disease, Neuropathy Stage Renal Disease, Neuropathy Date Acquired: 10/21/2020 02/16/2021 N/A Weeks of Treatment: 24 13 N/A Wound Status: Open Open N/A Measurements L x W x D (cm) 3x3.4x0.3 11.5x4.5x1.4 N/A Area (cm) : 8.011 40.644 N/A Volume (cm) : 2.403 56.902 N/A % Reduction in Area: 71.70% -488.00% N/A % Reduction in Volume: 15.00% -8134.70% N/A Classification: Unstageable/Unclassified Full Thickness With Exposed N/A Support Structures Exudate Amount: Large Large N/A Exudate Type: Serosanguineous Serosanguineous N/A Exudate Color: red, brown red, brown N/A Foul Odor After Cleansing: No Yes N/A Odor Anticipated Due to Product N/A No N/A UseBERNETHA, ANSCHUTZ ([id number]) Granulation Amount: Medium (34-66%) Small (1-33%) N/A Granulation Quality: Red Pink N/A Necrotic Amount: Medium (34-66%) Large (67-100%) N/A Necrotic Tissue: Adherent [place] N/A Exposed Structures: Fat Layer (Subcutaneous Tissue): Fascia: Yes N/A Yes Fat Layer (Subcutaneous Tissue): Fascia: No Yes Tendon: No Tendon: Yes Muscle: No Muscle: No Joint: No Joint: No Bone: No Bone:  No Epithelialization: Small (1-33%) None N/A Assessment Notes: N/A Blue-green drainage for wound. N/A Treatment Notes Electronic Signature(s) Signed: 06/07/2021 1:36:15 PM By: Gretta Cool, BSN, RN, CWS, Kim RN, BSN Entered By: Gretta Cool, BSN, RN, CWS, Kim on 05/21/2021 11:26:15 Amy Moses ([id number]) -------------------------------------------------------------------------------- [place] Details Patient Name: Amy Moses Date of Service: 05/21/2021 10:45 AM Medical Record Number: [id number] Patient Account Number: [id number] Date of Birth/Sex: [birth year] (49 y.o. F) Treating RN: Cornell Barman Primary Care Sriya Kroeze: Zenon Mayo Other Clinician: Referring Kathlene Yano: Zenon Mayo Treating Zilphia Kozinski/Extender: Skipper Cliche in Treatment: 24 Active Inactive Necrotic Tissue Nursing Diagnoses: Impaired tissue integrity related to necrotic/devitalized tissue Goals: Necrotic/devitalized tissue will be minimized in the wound bed Date Initiated: 12/02/2020 Target Resolution Date: 01/02/2021 Goal Status: Active Patient/caregiver will verbalize understanding of reason and process for debridement of necrotic tissue Date Initiated: 12/02/2020 Date Inactivated: 01/15/2021 Target Resolution Date: 01/02/2021  Goal Status: Met Interventions: Assess patient pain level pre-, during and post procedure and prior to discharge Provide education on necrotic tissue and debridement process Notes: Electronic Signature(s) Signed: 06/07/2021 1:36:15 PM By: Gretta Cool, BSN, RN, CWS, Kim RN, BSN Entered By: Gretta Cool, BSN, RN, CWS, Kim on 05/21/2021 11:26:07 Amy Moses ([id number]) -------------------------------------------------------------------------------- Pain Assessment Details Patient Name: Amy Moses Date of Service: 05/21/2021 10:45 AM Medical Record Number: [id number] Patient Account Number: [id number] Date of Birth/Sex: 1971/06/05 (49 y.o. F) Treating RN: Cornell Barman Primary  Care Thierry Dobosz: Zenon Mayo Other Clinician: Referring Yalonda Sample: Zenon Mayo Treating Ramie Palladino/Extender: Skipper Cliche in Treatment: 24 Active Problems Location of Pain Severity and Description of Pain Patient Has Paino No Site Locations Pain Management and Medication Current Pain Management: Notes Patient denies pain at this time, Electronic Signature(s) Signed: 06/07/2021 1:36:15 PM By: Gretta Cool, BSN, RN, CWS, Kim RN, BSN Entered By: Gretta Cool, BSN, RN, CWS, Kim on 05/21/2021 10:36:58 Amy Moses ([id number]) -------------------------------------------------------------------------------- Patient/Caregiver Education Details Patient Name: Amy Moses Date of Service: 05/21/2021 10:45 AM Medical Record Number: [id number] Patient Account Number: [id number] Date of Birth/Gender: 18-Jun-1971 (49 y.o. F) Treating RN: Cornell Barman Primary Care Physician: Zenon Mayo Other Clinician: Referring Physician: Zenon Mayo Treating Physician/Extender: Skipper Cliche in Treatment: 24 Education Assessment Education Provided To: Patient Education Topics Provided Wound/Skin Impairment: Handouts: Caring for Your Ulcer, Other: Continue wound care as prescribed Methods: Explain/Verbal, Printed Responses: State content correctly Electronic Signature(s) Signed: 06/07/2021 1:36:15 PM By: Gretta Cool, BSN, RN, CWS, Kim RN, BSN Entered By: Gretta Cool, BSN, RN, CWS, Kim on 05/21/2021 11:31:48 Amy Moses ([id number]) -------------------------------------------------------------------------------- Wound Assessment Details Patient Name: Amy Moses Date of Service: 05/21/2021 10:45 AM Medical Record Number: [id number] Patient Account Number: [id number] Date of Birth/Sex: 1971-10-14 (49 y.o. F) Treating RN: Cornell Barman Primary Care Roselina Burgueno: Zenon Mayo Other Clinician: Referring Robi Mitter: Zenon Mayo Treating Akon Reinoso/Extender: Skipper Cliche in Treatment: 24 Wound Status Wound  Number: 1 Primary Calciphylaxis Etiology: Wound Location: Right, Medial Upper Leg Wound Open Wounding Event: Gradually Appeared Status: Date Acquired: 07/22/2020 Comorbid Cataracts, Glaucoma, Anemia, Hypertension, Type II Weeks Of Treatment: 24 History: Diabetes, End Stage Renal Disease, Neuropathy Clustered Wound: No Photos Wound Measurements Length: (cm) 5 Width: (cm) 1.5 Depth: (cm) 0.1 Area: (cm) 5.89 Volume: (cm) 0.589 % Reduction in Area: 97.4% % Reduction in Volume: 97.4% Epithelialization: Small (1-33%) Tunneling: No Undermining: No Wound Description Classification: Full Thickness Without Exposed Support Structu Exudate Amount: Medium Exudate Type: Serous Exudate Color: amber res Foul Odor After Cleansing: No Slough/Fibrino Yes Wound Bed Granulation Amount: Medium (34-66%) Exposed Structure Granulation Quality: Red Fascia Exposed: No Necrotic Amount: Small (1-33%) Fat Layer (Subcutaneous Tissue) Exposed: Yes Necrotic Quality: Adherent Sl[place] Tendon Exposed: No Muscle Exposed: No Joint Exposed: No Bone Exposed: No Electronic Signature(s) Signed: 06/07/2021 1:36:15 PM By: Gretta Cool, BSN, RN, CWS, Kim RN, BSN Entered By: Gretta Cool, BSN, RN, CWS, Kim on 05/21/2021 11:00:48 Amy Moses ([id number]) -------------------------------------------------------------------------------- Wound Assessment Details Patient Name: Amy Moses Date of Service: 05/21/2021 10:45 AM Medical Record Number: [id number] Patient Account Number: [id number] Date of Birth/Sex: [birth year] (49 y.o. F) Treating RN: Cornell Barman Primary Care Moesha Sarchet: Zenon Mayo Other Clinician: Referring Tona Qualley: Zenon Mayo Treating Danita Proud/Extender: Skipper Cliche in Treatment: 24 Wound Status Wound Number: 10 Primary Calciphylaxis Etiology: Wound Location: Right, Lateral Calf Wound Open Wounding Event: Gradually Appeared Status: Date Acquired: 02/16/2021 Comorbid Cataracts, Glaucoma,  Anemia, Hypertension, Type II Weeks Of Treatment: 13 History: Diabetes, End Stage Renal Disease, Neuropathy Clustered Wound: No Photos Wound Measurements Length: (cm) 1.9  Width: (cm) 0.9 Depth: (cm) 0.7 Area: (cm) 1.343 Volume: (cm) 0.94 % Reduction in Area: 37.8% % Reduction in Volume: -335.2% Epithelialization: None Tunneling: No Undermining: No Wound Description Classification: Full Thickness Without Exposed Support Structu Exudate Amount: Medium Exudate Type: Serosanguineous Exudate Color: red, brown res Foul Odor After Cleansing: Yes Due to Product Use: No Slough/Fibrino No Wound Bed Granulation Amount: Small (1-33%) Exposed Structure Granulation Quality: Pink Fascia Exposed: No Necrotic Amount: Large (67-100%) Fat Layer (Subcutaneous Tissue) Exposed: Yes Necrotic Quality: Eschar, Adherent Slough Tendon Exposed: No Muscle Exposed: No Joint Exposed: No Bone Exposed: No Electronic Signature(s) Signed: 06/07/2021 1:36:15 PM By: Gretta Cool, BSN, RN, CWS, Kim RN, BSN Entered By: Gretta Cool, BSN, RN, CWS, Kim on 05/21/2021 11:01:26 Amy Moses ([id number]) -------------------------------------------------------------------------------- Wound Assessment Details Patient Name: Amy Moses Date of Service: 05/21/2021 10:45 AM Medical Record Number: [id number] Patient Account Number: [id number] Date of Birth/Sex: 02-04-1971 (49 y.o. F) Treating RN: Cornell Barman Primary Care Alletta Mattos: Zenon Mayo Other Clinician: Referring Brendolyn Stockley: Zenon Mayo Treating Bryauna Byrum/Extender: Skipper Cliche in Treatment: 24 Wound Status Wound Number: 11 Primary Calciphylaxis Etiology: Wound Location: Right, Distal, Posterior Lower Leg Wound Open Wounding Event: Gradually Appeared Status: Date Acquired: 03/21/2021 Comorbid Cataracts, Glaucoma, Anemia, Hypertension, Type II Weeks Of Treatment: 6 History: Diabetes, End Stage Renal Disease, Neuropathy Clustered Wound: No Photos Wound  Measurements Length: (cm) 8.1 Width: (cm) 5.6 Depth: (cm) 0.6 Area: (cm) 35.626 Volume: (cm) 21.375 % Reduction in Area: -807.2% % Reduction in Volume: -5338.9% Epithelialization: None Tunneling: No Undermining: No Wound Description Classification: Full Thickness Without Exposed Support Structu Exudate Amount: Large Exudate Type: Serosanguineous Exudate Color: red, brown res Foul Odor After Cleansing: Yes Due to Product Use: No Slough/Fibrino Yes Wound Bed Granulation Amount: Small (1-33%) Exposed Structure Granulation Quality: Red Fascia Exposed: No Necrotic Amount: Large (67-100%) Fat Layer (Subcutaneous Tissue) Exposed: Yes Necrotic Quality: Eschar Tendon Exposed: No Muscle Exposed: No Joint Exposed: No Bone Exposed: No Electronic Signature(s) Signed: 06/07/2021 1:36:15 PM By: Gretta Cool, BSN, RN, CWS, Kim RN, BSN Entered By: Gretta Cool, BSN, RN, CWS, Kim on 05/21/2021 11:02:13 Amy Moses ([id number]) -------------------------------------------------------------------------------- Wound Assessment Details Patient Name: Amy Moses Date of Service: 05/21/2021 10:45 AM Medical Record Number: [id number] Patient Account Number: [id number] Date of Birth/Sex: 03-29-1971 (49 y.o. F) Treating RN: Cornell Barman Primary Care Maitri Schnoebelen: Zenon Mayo Other Clinician: Referring Merci Walthers: Zenon Mayo Treating Hazelee Harbold/Extender: Skipper Cliche in Treatment: 24 Wound Status Wound Number: 12 Primary Etiology: Pressure Ulcer Wound Location: Left, Lateral Calcaneus Wound Status: Healed - Epithelialized Wounding Event: Gradually Appeared Date Acquired: 04/21/2021 Weeks Of Treatment: 2 Clustered Wound: No Wound Measurements Length: (cm) 0 Width: (cm) 0 Depth: (cm) 0 Area: (cm) 0 Volume: (cm) 0 % Reduction in Area: 100% % Reduction in Volume: 100% Wound Description Classification: Unstageable/Unclassified Electronic Signature(s) Signed: 06/07/2021 1:36:15 PM By: Gretta Cool,  BSN, RN, CWS, Kim RN, BSN Entered By: Gretta Cool, BSN, RN, CWS, Kim on 05/21/2021 10:46:48 Amy Moses ([id number]) -------------------------------------------------------------------------------- Wound Assessment Details Patient Name: Amy Moses Date of Service: 05/21/2021 10:45 AM Medical Record Number: [id number] Patient Account Number: [id number] Date of Birth/Sex: 08-02-1971 (49 y.o. F) Treating RN: Cornell Barman Primary Care Rane Blitch: Zenon Mayo Other Clinician: Referring Bodi Palmeri: Zenon Mayo Treating Kenderick Kobler/Extender: Skipper Cliche in Treatment: 24 Wound Status Wound Number: 2 Primary Calciphylaxis Etiology: Wound Location: Left, Medial Upper Leg Wound Open Wounding Event: Gradually Appeared Status: Date Acquired: 07/22/2020 Comorbid Cataracts, Glaucoma, Anemia, Hypertension, Type II Weeks Of Treatment: 24 History: Diabetes, End Stage Renal Disease, Neuropathy Clustered Wound: No  Photos Wound Measurements Length: (cm) 8.7 Width: (cm) 2.5 Depth: (cm) 0.2 Area: (cm) 17.082 Volume: (cm) 3.416 % Reduction in Area: 86.1% % Reduction in Volume: 72.1% Epithelialization: Small (1-33%) Tunneling: No Undermining: No Wound Description Classification: Full Thickness Without Exposed Support Structu Exudate Amount: Medium Exudate Type: Serosanguineous Exudate Color: red, brown res Foul Odor After Cleansing: No Slough/Fibrino Yes Wound Bed Granulation Amount: Large (67-100%) Exposed Structure Granulation Quality: Red Fascia Exposed: No Necrotic Amount: Small (1-33%) Fat Layer (Subcutaneous Tissue) Exposed: Yes Necrotic Quality: Adherent Slough Tendon Exposed: No Muscle Exposed: No Joint Exposed: No Bone Exposed: No Electronic Signature(s) Signed: 06/07/2021 1:36:15 PM By: Gretta Cool, BSN, RN, CWS, Kim RN, BSN Entered By: Gretta Cool, BSN, RN, CWS, Kim on 05/21/2021 11:02:38 Amy Moses  ([id number]) -------------------------------------------------------------------------------- Wound Assessment Details Patient Name: Amy Moses Date of Service: 05/21/2021 10:45 AM Medical Record Number: [id number] Patient Account Number: [id number] Date of Birth/Sex: 08-23-1971 (49 y.o. F) Treating RN: Cornell Barman Primary Care Vittorio Mohs: Zenon Mayo Other Clinician: Referring Athen Riel: Zenon Mayo Treating Ysidro Ramsay/Extender: Skipper Cliche in Treatment: 24 Wound Status Wound Number: 3 Primary Calciphylaxis Etiology: Wound Location: Left, Lateral Upper Leg Wound Open Wounding Event: Gradually Appeared Status: Date Acquired: 07/22/2020 Comorbid Cataracts, Glaucoma, Anemia, Hypertension, Type II Weeks Of Treatment: 24 History: Diabetes, End Stage Renal Disease, Neuropathy Clustered Wound: No Photos Wound Measurements Length: (cm) 2.6 Width: (cm) 2.1 Depth: (cm) 0.2 Area: (cm) 4.288 Volume: (cm) 0.858 % Reduction in Area: 82.9% % Reduction in Volume: 65.9% Epithelialization: Small (1-33%) Tunneling: No Undermining: No Wound Description Classification: Full Thickness Without Exposed Support Structu Exudate Amount: Medium Exudate Type: Serosanguineous Exudate Color: red, brown res Foul Odor After Cleansing: No Slough/Fibrino Yes Wound Bed Granulation Amount: Large (67-100%) Exposed Structure Granulation Quality: Red Fascia Exposed: No Necrotic Amount: Small (1-33%) Fat Layer (Subcutaneous Tissue) Exposed: Yes Necrotic Quality: Adherent Slough Tendon Exposed: No Muscle Exposed: No Joint Exposed: No Bone Exposed: No Electronic Signature(s) Signed: 06/07/2021 1:36:15 PM By: Gretta Cool, BSN, RN, CWS, Kim RN, BSN Entered By: Gretta Cool, BSN, RN, CWS, Kim on 05/21/2021 11:03:51 Amy Moses ([id number]) -------------------------------------------------------------------------------- Wound Assessment Details Patient Name: Amy Moses Date of Service: 05/21/2021  10:45 AM Medical Record Number: [id number] Patient Account Number: [id number] Date of Birth/Sex: 1971-10-05 (49 y.o. F) Treating RN: Cornell Barman Primary Care Roxanne Panek: Zenon Mayo Other Clinician: Referring Romualdo Prosise: Zenon Mayo Treating Zayra Devito/Extender: Skipper Cliche in Treatment: 24 Wound Status Wound Number: 4 Primary Pressure Ulcer Etiology: Wound Location: Right Calcaneus Wound Open Wounding Event: Gradually Appeared Status: Date Acquired: 10/21/2020 Comorbid Cataracts, Glaucoma, Anemia, Hypertension, Type II Weeks Of Treatment: 24 History: Diabetes, End Stage Renal Disease, Neuropathy Clustered Wound: No Photos Wound Measurements Length: (cm) 3 Width: (cm) 3.4 Depth: (cm) 0.3 Area: (cm) 8.011 Volume: (cm) 2.403 % Reduction in Area: 71.7% % Reduction in Volume: 15% Epithelialization: Small (1-33%) Tunneling: No Wound Description Classification: Unstageable/Unclassified Exudate Amount: Large Exudate Type: Serosanguineous Exudate Color: red, brown Foul Odor After Cleansing: No Slough/Fibrino Yes Wound Bed Granulation Amount: Medium (34-66%) Exposed Structure Granulation Quality: Red Fascia Exposed: No Necrotic Amount: Medium (34-66%) Fat Layer (Subcutaneous Tissue) Exposed: Yes Necrotic Quality: Adherent Slough Tendon Exposed: No Muscle Exposed: No Joint Exposed: No Bone Exposed: No Electronic Signature(s) Signed: 06/07/2021 1:36:15 PM By: Gretta Cool, BSN, RN, CWS, Kim RN, BSN Entered By: Gretta Cool, BSN, RN, CWS, Kim on 05/21/2021 11:05:37 Amy Moses ([id number]) -------------------------------------------------------------------------------- Wound Assessment Details Patient Name: Amy Moses Date of Service: 05/21/2021 10:45 AM Medical Record Number: [id number] Patient Account Number: [id number] Date of Birth/Sex: [birth year] (  50 y.o. F) Treating RN: Cornell Barman Primary Care Eisa Conaway: Zenon Mayo Other Clinician: Referring Havard Radigan: Zenon Mayo Treating Jerran Tappan/Extender: Skipper Cliche in Treatment: 24 Wound Status Wound Number: 9 Primary Calciphylaxis Etiology: Wound Location: Left, Lateral Calf Wound Open Wounding Event: Gradually Appeared Status: Date Acquired: 02/16/2021 Comorbid Cataracts, Glaucoma, Anemia, Hypertension, Type II Weeks Of Treatment: 13 History: Diabetes, End Stage Renal Disease, Neuropathy Clustered Wound: No Photos Wound Measurements Length: (cm) 11.5 Width: (cm) 4.5 Depth: (cm) 1.4 Area: (cm) 40.644 Volume: (cm) 56.902 % Reduction in Area: -488% % Reduction in Volume: -8134.7% Epithelialization: None Tunneling: No Undermining: No Wound Description Classification: Full Thickness With Exposed Support Structures Exudate Amount: Large Exudate Type: Serosanguineous Exudate Color: red, brown Foul Odor After Cleansing: Yes Due to Product Use: No Slough/Fibrino Yes Wound Bed Granulation Amount: Small (1-33%) Exposed Structure Granulation Quality: Pink Fascia Exposed: Yes Necrotic Amount: Large (67-100%) Fat Layer (Subcutaneous Tissue) Exposed: Yes Necrotic Quality: Eschar, Adherent Slough Tendon Exposed: Yes Muscle Exposed: No Joint Exposed: No Bone Exposed: No Assessment Notes Blue-green drainage for wound. Electronic Signature(s) Signed: 06/07/2021 1:36:15 PM By: Gretta Cool, BSN, RN, CWS, Kim RN, BSN Entered By: Gretta Cool, BSN, RN, CWS, Kim on 05/21/2021 11:04:49 Amy Moses ([id number]) -------------------------------------------------------------------------------- [place] Details Patient Name: Amy Moses Date of Service: 05/21/2021 10:45 AM Medical Record Number: [id number] Patient Account Number: [id number] Date of Birth/Sex: [birth year] (49 y.o. F) Treating RN: Cornell Barman Primary Care Ameris Akamine: Zenon Mayo Other Clinician: Referring Loui Massenburg: Zenon Mayo Treating Josie Burleigh/Extender: Skipper Cliche in Treatment: 24 Vital Signs Time Taken: 10:36 Temperature  (F): 98 Height (in): 66 Pulse (bpm): 78 Weight (lbs): 235 Respiratory Rate (breaths/min): 16 Body Mass Index (BMI): 37.9 Blood Pressure (mmHg): 153/78 Reference Range: 80 - 120 mg / dl Electronic Signature(s) Signed: 06/07/2021 1:36:15 PM By: Gretta Cool, BSN, RN, CWS, Kim RN, BSN Entered By: Gretta Cool, BSN, RN, CWS, Kim on 05/21/2021 10:36:25

## 2021-06-07 NOTE — Progress Notes (Addendum)
BEVERLIE, KURIHARA ([id number]) Visit Report for 06/04/2021 Arrival Information Details Patient Name: Amy Moses, Amy Moses Date of Service: 06/04/2021 11:00 AM Medical Record Number: [id number] Patient Account Number: [id number] Date of Birth/Sex: Oct 25, 1971 (50 y.o. F) Treating RN: Carlene Coria Primary Care Amaree Loisel: Zenon Mayo Other Clinician: Referring Romolo Sieling: Zenon Mayo Treating Kaylea Mounsey/Extender: Skipper Cliche in Treatment: 26 Visit Information History Since Last Visit All ordered tests and consults were completed: No Patient Arrived: Stretcher Added or deleted any medications: No Arrival Time: 11:18 Any new allergies or adverse reactions: No Accompanied By: self Had a fall or experienced change in No Transfer Assistance: None activities of daily living that may affect Patient Identification Verified: Yes risk of falls: Secondary Verification Process Completed: Yes Signs or symptoms of abuse/neglect since last visito No Patient Requires Transmission-Based No Hospitalized since last visit: No Precautions: Implantable device outside of the clinic excluding No Patient Has Alerts: Yes cellular tissue based products placed in the center Patient Alerts: Patient on Blood since last visit: Thinner Has Dressing in Place as Prescribed: Yes ASPIRIN Pain Present Now: No Electronic Signature(s) Signed: 06/07/2021 2:11:15 PM By: Carlene Coria RN Entered By: Carlene Coria on 06/04/2021 [place], [place] ([id number]) -------------------------------------------------------------------------------- Clinic Level of Care Assessment Details Patient Name: Amy Moses Date of Service: 06/04/2021 11:00 AM Medical Record Number: [id number] Patient Account Number: [id number] Date of Birth/Sex: Feb 12, 1971 (49 y.o. F) Treating RN: Carlene Coria Primary Care Chaunte Hornbeck: Zenon Mayo Other Clinician: Referring Tayten Bergdoll: Zenon Mayo Treating Antonina Deziel/Extender: Skipper Cliche  in Treatment: 26 Clinic Level of Care Assessment Items TOOL 4 Quantity Score X - Use when only an EandM is performed on FOLLOW-UP visit 1 0 ASSESSMENTS - Nursing Assessment / Reassessment X - Reassessment of Co-morbidities (includes updates in patient status) 1 10 X- 1 5 Reassessment of Adherence to Treatment Plan ASSESSMENTS - Wound and Skin Assessment / Reassessment _0  - Simple Wound Assessment / Reassessment - one wound 0 X- 9 5 Complex Wound Assessment / Reassessment - multiple wounds _1  - 0 Dermatologic / Skin Assessment (not related to wound area) ASSESSMENTS - Focused Assessment _2  - Circumferential Edema Measurements - multi extremities 0 _3  - 0 Nutritional Assessment / Counseling / Intervention _4  - 0 Lower Extremity Assessment (monofilament, tuning fork, pulses) _5  - 0 Peripheral Arterial Disease Assessment (using hand held doppler) ASSESSMENTS - Ostomy and/or Continence Assessment and Care _6  - Incontinence Assessment and Management 0 _7  - 0 Ostomy Care Assessment and Management (repouching, etc.) PROCESS - Coordination of Care X - Simple Patient / Family Education for ongoing care 1 15 _8  - 0 Complex (extensive) Patient / Family Education for ongoing care _9  - 0 Staff obtains Programmer, systems, Records, Test Results / Process Orders _10  - 0 Staff telephones HHA, Nursing Homes / Clarify orders / etc _11  - 0 Routine Transfer to another Facility (non-emergent condition) _12  - 0 Routine Hospital Admission (non-emergent condition) _13  - 0 New Admissions / Biomedical engineer / Ordering NPWT, Apligraf, etc. _14  - 0 Emergency Hospital Admission (emergent condition) X- 1 10 Simple Discharge Coordination _15  - 0 Complex (extensive) Discharge Coordination PROCESS - Special Needs _16  - Pediatric / Minor Patient Management 0 _17  - 0 Isolation Patient Management _18  - 0 Hearing / Language / Visual special needs _19  - 0 Assessment of Community assistance (transportation, D/C  planning, etc.) _20  - 0 Additional assistance / Altered mentation _21  - 0 Support Surface(s) Assessment (bed, cushion, seat, etc.) INTERVENTIONS - Wound Cleansing / Measurement Pinette, Vallory ([id number]) _22  - 0 Simple Wound Cleansing -  one wound X- 9 5 Complex Wound Cleansing - multiple wounds X- 1 5 Wound Imaging (photographs - any number of wounds) _0  - 0 Wound Tracing (instead of photographs) _1  - 0 Simple Wound Measurement - one wound X- 9 5 Complex Wound Measurement - multiple wounds INTERVENTIONS - Wound Dressings X - Small Wound Dressing one or multiple wounds 7 10 X- 2 15 Medium Wound Dressing one or multiple wounds _2  - 0 Large Wound Dressing one or multiple wounds X- 1 5 Application of Medications - topical <NFAOZHYQMVHQIONG>_2<\/[id number]>_5  - 0 Application of Medications - injection INTERVENTIONS - Miscellaneous _4  - External ear exam 0 _5  - 0 Specimen Collection (cultures, biopsies, blood, body fluids, etc.) _6  - 0 Specimen(s) / Culture(s) sent or taken to Lab for analysis _7  - 0 Patient Transfer (multiple staff / Civil Service fast streamer / Similar devices) _8  - 0 Simple Staple / Suture removal (25 or less) _9  - 0 Complex Staple / Suture removal (26 or more) _10  - 0 Hypo / Hyperglycemic Management (close monitor of Blood Glucose) _11  - 0 Ankle / Brachial Index (ABI) - do not check if billed separately X- 1 5 Vital Signs Has the patient been seen at the hospital within the last three years: Yes Total Score: 290 Level Of Care: New/Established - Level 5 Electronic Signature(s) Signed: 06/07/2021 2:11:15 PM By: Carlene Coria RN Entered By: Carlene Coria on 06/04/2021 14:52:54 Amy Moses ([id number]) -------------------------------------------------------------------------------- Complex / Palliative Patient Assessment Details Patient Name: Amy Moses Date of Service: 06/04/2021 11:00 AM Medical Record Number: [id number] Patient Account Number: [id number] Date of Birth/Sex: 1971/05/15 (50  y.o. F) Treating RN: Dolan Amen Primary Care Zhyon Antenucci: Zenon Mayo Other Clinician: Referring Amantha Sklar: Zenon Mayo Treating Kauan Kloosterman/Extender: Skipper Cliche in Treatment: 26 Palliative Management Criteria Complex Wound Management Criteria Patient has remarkable or complex co-morbidities requiring medications or treatments that extend wound healing times. Examples: o Diabetes mellitus with chronic renal failure or end stage renal disease requiring dialysis o Advanced or poorly controlled rheumatoid arthritis o Diabetes mellitus and end stage chronic obstructive pulmonary disease o Active cancer with current chemo- or radiation therapy uncontrolled Type 2 Diabetic, end stage renal disease Care Approach Wound Care Plan: Complex Wound Management Electronic Signature(s) Signed: 06/14/2021 10:24:10 AM By: Dolan Amen RN Signed: 07/21/2021 5:26:53 PM By: Worthy Keeler PA-C Entered By: Dolan Amen on 06/14/2021 10:24:09 Amy Moses ([id number]) -------------------------------------------------------------------------------- Encounter Discharge Information Details Patient Name: Amy Moses Date of Service: 06/04/2021 11:00 AM Medical Record Number: [id number] Patient Account Number: [id number] Date of Birth/Sex: 1970-12-15 (51 y.o. F) Treating RN: Carlene Coria Primary Care Anastasya Jewell: Zenon Mayo Other Clinician: Referring Camylle Whicker: Zenon Mayo Treating Ashlan Dignan/Extender: Skipper Cliche in Treatment: 26 Encounter Discharge Information Items Discharge Condition: Stable Ambulatory Status: Stretcher Discharge Destination: Home Transportation: Ambulance Accompanied By: self Schedule Follow-up Appointment: Yes Clinical Summary of Care: Patient Declined Electronic Signature(s) Signed: 06/07/2021 2:11:15 PM By: Carlene Coria RN Entered By: Carlene Coria on 06/04/2021 14:55:17 Amy Moses  ([id number]) -------------------------------------------------------------------------------- Lower Extremity Assessment Details Patient Name: Amy Moses Date of Service: 06/04/2021 11:00 AM Medical Record Number: [id number] Patient Account Number: [id number] Date of Birth/Sex: 07-20-1971 (49 y.o. F) Treating RN: Carlene Coria Primary Care Ashley Bultema: Zenon Mayo Other Clinician: Referring Channell Quattrone: Zenon Mayo Treating Quadasia Newsham/Extender: Skipper Cliche in Treatment: 26 Edema Assessment Assessed: [Left: No] [Right: No] Edema: [Left: Yes] [Right: Yes] Calf Left: Right: Point of Measurement: 30 cm From Medial Instep 44 cm 40 cm Ankle Left: Right: Point of Measurement: 12 cm From  Medial Instep 26 cm 25 cm Electronic Signature(s) Signed: 06/07/2021 2:11:15 PM By: Carlene Coria RN Entered By: Carlene Coria on 06/04/2021 11:37:15 Amy Moses ([id number]) -------------------------------------------------------------------------------- Multi Wound Chart Details Patient Name: Amy Moses Date of Service: 06/04/2021 11:00 AM Medical Record Number: [id number] Patient Account Number: [id number] Date of Birth/Sex: [birth year] (49 y.o. F) Treating RN: Carlene Coria Primary Care Deyon Chizek: Zenon Mayo Other Clinician: Referring Nafisa Olds: Zenon Mayo Treating Trust Crago/Extender: Skipper Cliche in Treatment: 26 Vital Signs Height(in): 44 Pulse(bpm): 65 Weight(lbs): 235 Blood Pressure(mmHg): 148/83 Body Mass Index(BMI): 38 Temperature(F): 98.4 Respiratory Rate(breaths/min): 18 Photos: Wound Location: Right, Medial Upper Leg Right, Lateral Calf Right, Distal, Posterior Lower Leg Wounding Event: Gradually Appeared Gradually Appeared Gradually Appeared Primary Etiology: Calciphylaxis Calciphylaxis Calciphylaxis Comorbid History: Cataracts, Glaucoma, Anemia, Cataracts, Glaucoma, Anemia, Cataracts, Glaucoma, Anemia, Hypertension, Type II Diabetes, End Hypertension, Type  II Diabetes, End Hypertension, Type II Diabetes, End Stage Renal Disease, Neuropathy Stage Renal Disease, Neuropathy Stage Renal Disease, Neuropathy Date Acquired: 07/22/2020 02/16/2021 03/21/2021 Weeks of Treatment: _0 Wound Status: Open Open Open Measurements L x W x D (cm) 0x0x0 1.9x0.9x0.5 8.7x6x0.5 Area (cm) : 0 1.343 40.998 Volume (cm) : 0 0.672 20.499 % Reduction in Area: 100.00% 37.80% -944.00% % Reduction in Volume: 100.00% -211.10% -5116.00% Classification: Full Thickness Without Exposed Full Thickness Without Exposed Full Thickness Without Exposed Support Structures Support Structures Support Structures Exudate Amount: None Present Medium Medium Exudate Type: N/A Serosanguineous Serosanguineous Exudate Color: N/A red, brown red, brown Foul Odor After Cleansing: No Yes No Odor Anticipated Due to Product N/A No N/A Use: Granulation Amount: None Present (0%) Small (1-33%) Medium (34-66%) Granulation Quality: N/A Pink Red Necrotic Amount: None Present (0%) Large (67-100%) Medium (34-66%) Necrotic Tissue: N/A Eschar, Adherent [place], Adherent Slough Exposed Structures: Fascia: No Fat Layer (Subcutaneous Tissue): Fat Layer (Subcutaneous Tissue): Fat Layer (Subcutaneous Tissue): Yes Yes No Fascia: No Fascia: No Tendon: No Tendon: No Tendon: No Muscle: No Muscle: No Muscle: No Joint: No Joint: No Joint: No Bone: No Bone: No Bone: No Epithelialization: Large (67-100%) None None Wound Number: _1 PhotosJAIDAH, LOMAX ([id number]) Wound Location: Left Gluteus Left Gluteus Left, Medial Upper Leg Wounding Event: Gradually Appeared Gradually Appeared Gradually Appeared Primary Etiology: Pressure Ulcer Pressure Ulcer Calciphylaxis Comorbid History: Cataracts, Glaucoma, Anemia, Cataracts, Glaucoma, Anemia, Cataracts, Glaucoma, Anemia, Hypertension, Type II Diabetes, End Hypertension, Type II Diabetes, End Hypertension, Type II Diabetes, End Stage Renal  Disease, Neuropathy Stage Renal Disease, Neuropathy Stage Renal Disease, Neuropathy Date Acquired: 05/31/2021 05/28/2021 07/22/2020 Weeks of Treatment: 0 0 26 Wound Status: Open Open Open Measurements L x W x D (cm) 1.3x2x0.1 1.5x2.3x0.1 1x1.5x0.1 Area (cm) : 2.042 2.71 1.178 Volume (cm) : 0.204 0.271 0.118 % Reduction in Area: N/A N/A 99.00% % Reduction in Volume: N/A N/A 99.00% Classification: Category/Stage II Category/Stage II Full Thickness Without Exposed Support Structures Exudate Amount: Medium Medium Medium Exudate Type: Serosanguineous Serosanguineous Serosanguineous Exudate Color: red, brown red, brown red, brown Foul Odor After Cleansing: No No No Odor Anticipated Due to Product N/A N/A N/A Use: Granulation Amount: Large (67-100%) Large (67-100%) Large (67-100%) Granulation Quality: Red Red Red Necrotic Amount: None Present (0%) Small (1-33%) Small (1-33%) Necrotic Tissue: N/A Eschar Adherent Slough Exposed Structures: Fat Layer (Subcutaneous Tissue): Fat Layer (Subcutaneous Tissue): Fat Layer (Subcutaneous Tissue): Yes Yes Yes Fascia: No Fascia: No Fascia: No Tendon: No Tendon: No Tendon: No Muscle: No Muscle: No Muscle: No Joint: No Joint: No Joint: No Bone: No Bone: No Bone: No  Epithelialization: None None Small (1-33%) Wound Number: _0 Photos: Wound Location: Left, Lateral Upper Leg Right Calcaneus Left, Lateral Calf Wounding Event: Gradually Appeared Gradually Appeared Gradually Appeared Primary Etiology: Calciphylaxis Pressure Ulcer Calciphylaxis Comorbid History: Cataracts, Glaucoma, Anemia, Cataracts, Glaucoma, Anemia, Cataracts, Glaucoma, Anemia, Hypertension, Type II Diabetes, End Hypertension, Type II Diabetes, End Hypertension, Type II Diabetes, End Stage Renal Disease, Neuropathy Stage Renal Disease, Neuropathy Stage Renal Disease, Neuropathy Date Acquired: 07/22/2020 10/21/2020 02/16/2021 Weeks of Treatment: _1 Wound Status: Open Open  Open Measurements L x W x D (cm) 2.5x2x0.1 2.5x3x0.3 11.6x6.5x1.4 Area (cm) : 3.927 5.89 59.219 Volume (cm) : 0.393 1.767 82.907 % Reduction in Area: 84.40% 79.20% -756.80% % Reduction in Volume: 84.40% 37.50% -11898.10% Classification: Full Thickness Without Exposed Unstageable/Unclassified Full Thickness With Exposed Support Structures Support Structures Exudate Amount: Medium Large Medium Exudate Type: Serosanguineous Serosanguineous Serosanguineous Exudate Color: red, brown red, brown red, brown Foul Odor After Cleansing: No No Yes Odor Anticipated Due to Product N/A N/A No Use: Granulation Amount: Medium (34-66%) Medium (34-66%) Small (1-33%) Granulation Quality: N/A Red Pink [place], [place] ([id number]) Necrotic Amount: Medium (34-66%) Medium (34-66%) Large (67-100%) Necrotic Tissue: Adherent [place] Exposed Structures: Fat Layer (Subcutaneous Tissue): Fat Layer (Subcutaneous Tissue): Fascia: Yes Yes Yes Fat Layer (Subcutaneous Tissue): Fascia: No Fascia: No Yes Tendon: No Tendon: No Tendon: Yes Muscle: No Muscle: No Muscle: No Joint: No Joint: No Joint: No Bone: No Bone: No Bone: No Epithelialization: Large (67-100%) Small (1-33%) None Treatment Notes Electronic Signature(s) Signed: 06/07/2021 2:11:15 PM By: Carlene Coria RN Entered By: Carlene Coria on 06/04/2021 11:37:56 Amy Moses ([id number]) -------------------------------------------------------------------------------- Multi-Disciplinary Care Plan Details Patient Name: Amy Moses Date of Service: 06/04/2021 11:00 AM Medical Record Number: [id number] Patient Account Number: [id number] Date of Birth/Sex: [birth year] (49 y.o. F) Treating RN: Carlene Coria Primary Care Shaana Acocella: Zenon Mayo Other Clinician: Referring Jakhai Fant: Zenon Mayo Treating Kiwanna Spraker/Extender: Skipper Cliche in Treatment: 26 Active Inactive Necrotic Tissue Nursing  Diagnoses: Impaired tissue integrity related to necrotic/devitalized tissue Goals: Necrotic/devitalized tissue will be minimized in the wound bed Date Initiated: 12/02/2020 Target Resolution Date: 07/05/2021 Goal Status: Active Patient/caregiver will verbalize understanding of reason and process for debridement of necrotic tissue Date Initiated: 12/02/2020 Date Inactivated: 01/15/2021 Target Resolution Date: 01/02/2021 Goal Status: Met Interventions: Assess patient pain level pre-, during and post procedure and prior to discharge Provide education on necrotic tissue and debridement process Notes: Electronic Signature(s) Signed: 06/07/2021 2:11:15 PM By: Carlene Coria RN Entered By: Carlene Coria on 06/04/2021 11:37:39 Amy Moses ([id number]) -------------------------------------------------------------------------------- Pain Assessment Details Patient Name: Amy Moses Date of Service: 06/04/2021 11:00 AM Medical Record Number: [id number] Patient Account Number: [id number] Date of Birth/Sex: 01-Jul-1971 (50 y.o. F) Treating RN: Carlene Coria Primary Care Rufina Kimery: Zenon Mayo Other Clinician: Referring Richetta Cubillos: Zenon Mayo Treating Aissa Lisowski/Extender: Skipper Cliche in Treatment: 26 Active Problems Location of Pain Severity and Description of Pain Patient Has Paino No Site Locations Pain Management and Medication Current Pain Management: Electronic Signature(s) Signed: 06/07/2021 2:11:15 PM By: Carlene Coria RN Entered By: Carlene Coria on 06/04/2021 11:19:45 Amy Moses ([id number]) -------------------------------------------------------------------------------- Patient/Caregiver Education Details Patient Name: Amy Moses Date of Service: 06/04/2021 11:00 AM Medical Record Number: [id number] Patient Account Number: [id number] Date of Birth/Gender: 05-24-1971 (49 y.o. F) Treating RN: Carlene Coria Primary Care Physician: Zenon Mayo Other  Clinician: Referring Physician: Zenon Mayo Treating Physician/Extender: Skipper Cliche in Treatment: 33 Education Assessment Education Provided To: Patient Education Topics Provided Wound Debridement: Methods: Explain/Verbal  Responses: State content correctly Electronic Signature(s) Signed: 06/07/2021 2:11:15 PM By: Carlene Coria RN Entered By: Carlene Coria on 06/04/2021 14:53:54 Amy Moses ([id number]) -------------------------------------------------------------------------------- Wound Assessment Details Patient Name: Amy Moses Date of Service: 06/04/2021 11:00 AM Medical Record Number: [id number] Patient Account Number: [id number] Date of Birth/Sex: May 10, 1971 (49 y.o. F) Treating RN: Carlene Coria Primary Care Zylen Wenig: Zenon Mayo Other Clinician: Referring Yacine Garriga: Zenon Mayo Treating Dashanae Longfield/Extender: Skipper Cliche in Treatment: 26 Wound Status Wound Number: 1 Primary Calciphylaxis Etiology: Wound Location: Right, Medial Upper Leg Wound Open Wounding Event: Gradually Appeared Status: Date Acquired: 07/22/2020 Comorbid Cataracts, Glaucoma, Anemia, Hypertension, Type II Weeks Of Treatment: 26 History: Diabetes, End Stage Renal Disease, Neuropathy Clustered Wound: No Photos Wound Measurements Length: (cm) 0 Width: (cm) 0 Depth: (cm) 0 Area: (cm) 0 Volume: (cm) 0 % Reduction in Area: 100% % Reduction in Volume: 100% Epithelialization: Large (67-100%) Tunneling: No Undermining: No Wound Description Classification: Full Thickness Without Exposed Support Structure Exudate Amount: None Present s Foul Odor After Cleansing: No Slough/Fibrino No Wound Bed Granulation Amount: None Present (0%) Exposed Structure Necrotic Amount: None Present (0%) Fascia Exposed: No Fat Layer (Subcutaneous Tissue) Exposed: No Tendon Exposed: No Muscle Exposed: No Joint Exposed: No Bone Exposed: No Electronic Signature(s) Signed: 06/07/2021 2:11:15  PM By: Carlene Coria RN Entered By: Carlene Coria on 06/04/2021 11:27:12 Amy Moses ([id number]) -------------------------------------------------------------------------------- Wound Assessment Details Patient Name: Amy Moses Date of Service: 06/04/2021 11:00 AM Medical Record Number: [id number] Patient Account Number: [id number] Date of Birth/Sex: 06/30/1971 (50 y.o. F) Treating RN: Carlene Coria Primary Care Ravleen Ries: Zenon Mayo Other Clinician: Referring Ashe Graybeal: Zenon Mayo Treating Lillieann Pavlich/Extender: Skipper Cliche in Treatment: 26 Wound Status Wound Number: 10 Primary Calciphylaxis Etiology: Wound Location: Right, Lateral Calf Wound Open Wounding Event: Gradually Appeared Status: Date Acquired: 02/16/2021 Comorbid Cataracts, Glaucoma, Anemia, Hypertension, Type II Weeks Of Treatment: 15 History: Diabetes, End Stage Renal Disease, Neuropathy Clustered Wound: No Photos Wound Measurements Length: (cm) 1.9 Width: (cm) 0.9 Depth: (cm) 0.5 Area: (cm) 1.343 Volume: (cm) 0.672 % Reduction in Area: 37.8% % Reduction in Volume: -211.1% Epithelialization: None Tunneling: No Undermining: No Wound Description Classification: Full Thickness Without Exposed Support Structu Exudate Amount: Medium Exudate Type: Serosanguineous Exudate Color: red, brown res Foul Odor After Cleansing: Yes Due to Product Use: No Slough/Fibrino No Wound Bed Granulation Amount: Small (1-33%) Exposed Structure Granulation Quality: Pink Fascia Exposed: No Necrotic Amount: Large (67-100%) Fat Layer (Subcutaneous Tissue) Exposed: Yes Necrotic Quality: Eschar, Adherent Slough Tendon Exposed: No Muscle Exposed: No Joint Exposed: No Bone Exposed: No Electronic Signature(s) Signed: 06/07/2021 2:11:15 PM By: Carlene Coria RN Entered By: Carlene Coria on 06/04/2021 11:21:10 Amy Moses  ([id number]) -------------------------------------------------------------------------------- Wound Assessment Details Patient Name: Amy Moses Date of Service: 06/04/2021 11:00 AM Medical Record Number: [id number] Patient Account Number: [id number] Date of Birth/Sex: 1971-01-21 (50 y.o. F) Treating RN: Carlene Coria Primary Care Keimya Briddell: Zenon Mayo Other Clinician: Referring Dannon Nguyenthi: Zenon Mayo Treating Marquin Patino/Extender: Skipper Cliche in Treatment: 26 Wound Status Wound Number: 11 Primary Calciphylaxis Etiology: Wound Location: Right, Distal, Posterior Lower Leg Wound Open Wounding Event: Gradually Appeared Status: Date Acquired: 03/21/2021 Comorbid Cataracts, Glaucoma, Anemia, Hypertension, Type II Weeks Of Treatment: 8 History: Diabetes, End Stage Renal Disease, Neuropathy Clustered Wound: No Photos Wound Measurements Length: (cm) 8.7 Width: (cm) 6 Depth: (cm) 0.5 Area: (cm) 40.998 Volume: (cm) 20.499 % Reduction in Area: -944% % Reduction in Volume: -5116% Epithelialization: None Tunneling: No Undermining: No Wound Description Classification: Full Thickness Without Exposed Support Structu Exudate Amount:  Medium Exudate Type: Serosanguineous Exudate Color: red, brown res Foul Odor After Cleansing: No Slough/Fibrino Yes Wound Bed Granulation Amount: Medium (34-66%) Exposed Structure Granulation Quality: Red Fascia Exposed: No Necrotic Amount: Medium (34-66%) Fat Layer (Subcutaneous Tissue) Exposed: Yes Necrotic Quality: Eschar, Adherent Slough Tendon Exposed: No Muscle Exposed: No Joint Exposed: No Bone Exposed: No Electronic Signature(s) Signed: 06/07/2021 2:11:15 PM By: Carlene Coria RN Entered By: Carlene Coria on 06/04/2021 11:22:14 Amy Moses ([id number]) -------------------------------------------------------------------------------- Wound Assessment Details Patient Name: Amy Moses Date of Service: 06/04/2021 11:00  AM Medical Record Number: [id number] Patient Account Number: [id number] Date of Birth/Sex: 31-Mar-1971 (50 y.o. F) Treating RN: Carlene Coria Primary Care Janna Oak: Zenon Mayo Other Clinician: Referring Athalia Setterlund: Zenon Mayo Treating Mikaelah Trostle/Extender: Skipper Cliche in Treatment: 26 Wound Status Wound Number: 13 Primary Pressure Ulcer Etiology: Wound Location: Left Gluteus Wound Open Wounding Event: Gradually Appeared Status: Date Acquired: 05/31/2021 Comorbid Cataracts, Glaucoma, Anemia, Hypertension, Type II Weeks Of Treatment: 0 History: Diabetes, End Stage Renal Disease, Neuropathy Clustered Wound: No Photos Wound Measurements Length: (cm) 1.3 Width: (cm) 2 Depth: (cm) 0.1 Area: (cm) 2.042 Volume: (cm) 0.204 % Reduction in Area: % Reduction in Volume: Epithelialization: None Tunneling: No Undermining: No Wound Description Classification: Category/Stage II Exudate Amount: Medium Exudate Type: Serosanguineous Exudate Color: red, brown Foul Odor After Cleansing: No Slough/Fibrino Yes Wound Bed Granulation Amount: Large (67-100%) Exposed Structure Granulation Quality: Red Fascia Exposed: No Necrotic Amount: None Present (0%) Fat Layer (Subcutaneous Tissue) Exposed: Yes Tendon Exposed: No Muscle Exposed: No Joint Exposed: No Bone Exposed: No Electronic Signature(s) Signed: 06/07/2021 2:11:15 PM By: Carlene Coria RN Entered By: Carlene Coria on 06/04/2021 11:32:16 Amy Moses ([id number]) -------------------------------------------------------------------------------- Wound Assessment Details Patient Name: Amy Moses Date of Service: 06/04/2021 11:00 AM Medical Record Number: [id number] Patient Account Number: [id number] Date of Birth/Sex: Apr 17, 1971 (50 y.o. F) Treating RN: Carlene Coria Primary Care Jasier Calabretta: Zenon Mayo Other Clinician: Referring Korryn Pancoast: Zenon Mayo Treating Kord Monette/Extender: Skipper Cliche in Treatment:  26 Wound Status Wound Number: 14 Primary Pressure Ulcer Etiology: Wound Location: Left Gluteus Wound Open Wounding Event: Gradually Appeared Status: Date Acquired: 05/28/2021 Comorbid Cataracts, Glaucoma, Anemia, Hypertension, Type II Weeks Of Treatment: 0 History: Diabetes, End Stage Renal Disease, Neuropathy Clustered Wound: No Photos Wound Measurements Length: (cm) 1.5 Width: (cm) 2.3 Depth: (cm) 0.1 Area: (cm) 2.71 Volume: (cm) 0.271 % Reduction in Area: % Reduction in Volume: Epithelialization: None Tunneling: No Undermining: No Wound Description Classification: Category/Stage II Exudate Amount: Medium Exudate Type: Serosanguineous Exudate Color: red, brown Foul Odor After Cleansing: No Slough/Fibrino Yes Wound Bed Granulation Amount: Large (67-100%) Exposed Structure Granulation Quality: Red Fascia Exposed: No Necrotic Amount: Small (1-33%) Fat Layer (Subcutaneous Tissue) Exposed: Yes Necrotic Quality: Eschar Tendon Exposed: No Muscle Exposed: No Joint Exposed: No Bone Exposed: No Electronic Signature(s) Signed: 06/07/2021 2:11:15 PM By: Carlene Coria RN Entered By: Carlene Coria on 06/04/2021 11:34:06 Amy Moses ([id number]) -------------------------------------------------------------------------------- Wound Assessment Details Patient Name: Amy Moses Date of Service: 06/04/2021 11:00 AM Medical Record Number: [id number] Patient Account Number: [id number] Date of Birth/Sex: [birth year] (50 y.o. F) Treating RN: Carlene Coria Primary Care Praveen Coia: Zenon Mayo Other Clinician: Referring Terrionna Bridwell: Zenon Mayo Treating Eula Mazzola/Extender: Skipper Cliche in Treatment: 26 Wound Status Wound Number: 2 Primary Calciphylaxis Etiology: Wound Location: Left, Medial Upper Leg Wound Open Wounding Event: Gradually Appeared Status: Date Acquired: 07/22/2020 Comorbid Cataracts, Glaucoma, Anemia, Hypertension, Type II Weeks Of Treatment:  26 History: Diabetes, End Stage Renal Disease, Neuropathy Clustered Wound: No Photos Wound Measurements Length: (cm) 1 Width: (  cm) 1.5 Depth: (cm) 0.1 Area: (cm) 1.178 Volume: (cm) 0.118 % Reduction in Area: 99% % Reduction in Volume: 99% Epithelialization: Small (1-33%) Tunneling: No Undermining: No Wound Description Classification: Full Thickness Without Exposed Support Structu Exudate Amount: Medium Exudate Type: Serosanguineous Exudate Color: red, brown res Foul Odor After Cleansing: No Slough/Fibrino Yes Wound Bed Granulation Amount: Large (67-100%) Exposed Structure Granulation Quality: Red Fascia Exposed: No Necrotic Amount: Small (1-33%) Fat Layer (Subcutaneous Tissue) Exposed: Yes Necrotic Quality: Adherent Slough Tendon Exposed: No Muscle Exposed: No Joint Exposed: No Bone Exposed: No Electronic Signature(s) Signed: 06/07/2021 2:11:15 PM By: Carlene Coria RN Entered By: Carlene Coria on 06/04/2021 11:28:01 Amy Moses ([id number]) -------------------------------------------------------------------------------- Wound Assessment Details Patient Name: Amy Moses Date of Service: 06/04/2021 11:00 AM Medical Record Number: [id number] Patient Account Number: [id number] Date of Birth/Sex: 07-15-1971 (50 y.o. F) Treating RN: Carlene Coria Primary Care Ahliyah Nienow: Zenon Mayo Other Clinician: Referring Keidan Aumiller: Zenon Mayo Treating Clovis Warwick/Extender: Skipper Cliche in Treatment: 26 Wound Status Wound Number: 3 Primary Calciphylaxis Etiology: Wound Location: Left, Lateral Upper Leg Wound Open Wounding Event: Gradually Appeared Status: Date Acquired: 07/22/2020 Comorbid Cataracts, Glaucoma, Anemia, Hypertension, Type II Weeks Of Treatment: 26 History: Diabetes, End Stage Renal Disease, Neuropathy Clustered Wound: No Photos Wound Measurements Length: (cm) 2.5 Width: (cm) 2 Depth: (cm) 0.1 Area: (cm) 3.927 Volume: (cm) 0.393 % Reduction in  Area: 84.4% % Reduction in Volume: 84.4% Epithelialization: Large (67-100%) Tunneling: No Undermining: No Wound Description Classification: Full Thickness Without Exposed Support Structu Exudate Amount: Medium Exudate Type: Serosanguineous Exudate Color: red, brown res Foul Odor After Cleansing: No Slough/Fibrino Yes Wound Bed Granulation Amount: Medium (34-66%) Exposed Structure Necrotic Amount: Medium (34-66%) Fascia Exposed: No Necrotic Quality: Adherent Slough Fat Layer (Subcutaneous Tissue) Exposed: Yes Tendon Exposed: No Muscle Exposed: No Joint Exposed: No Bone Exposed: No Electronic Signature(s) Signed: 06/07/2021 2:11:15 PM By: Carlene Coria RN Entered By: Carlene Coria on 06/04/2021 11:24:30 Amy Moses ([id number]) -------------------------------------------------------------------------------- Wound Assessment Details Patient Name: Amy Moses Date of Service: 06/04/2021 11:00 AM Medical Record Number: [id number] Patient Account Number: [id number] Date of Birth/Sex: 1970-12-26 (50 y.o. F) Treating RN: Carlene Coria Primary Care Kaladin Noseworthy: Zenon Mayo Other Clinician: Referring Toney Difatta: Zenon Mayo Treating Aneliz Carbary/Extender: Skipper Cliche in Treatment: 26 Wound Status Wound Number: 4 Primary Pressure Ulcer Etiology: Wound Location: Right Calcaneus Wound Open Wounding Event: Gradually Appeared Status: Date Acquired: 10/21/2020 Comorbid Cataracts, Glaucoma, Anemia, Hypertension, Type II Weeks Of Treatment: 26 History: Diabetes, End Stage Renal Disease, Neuropathy Clustered Wound: No Photos Wound Measurements Length: (cm) 2.5 Width: (cm) 3 Depth: (cm) 0.3 Area: (cm) 5.89 Volume: (cm) 1.767 % Reduction in Area: 79.2% % Reduction in Volume: 37.5% Epithelialization: Small (1-33%) Tunneling: No Undermining: No Wound Description Classification: Unstageable/Unclassified Exudate Amount: Large Exudate Type: Serosanguineous Exudate Color:  red, brown Foul Odor After Cleansing: No Slough/Fibrino Yes Wound Bed Granulation Amount: Medium (34-66%) Exposed Structure Granulation Quality: Red Fascia Exposed: No Necrotic Amount: Medium (34-66%) Fat Layer (Subcutaneous Tissue) Exposed: Yes Necrotic Quality: Adherent Slough Tendon Exposed: No Muscle Exposed: No Joint Exposed: No Bone Exposed: No Electronic Signature(s) Signed: 06/07/2021 2:11:15 PM By: Carlene Coria RN Entered By: Carlene Coria on 06/04/2021 11:20:29 Amy Moses ([id number]) -------------------------------------------------------------------------------- Wound Assessment Details Patient Name: Amy Moses Date of Service: 06/04/2021 11:00 AM Medical Record Number: [id number] Patient Account Number: [id number] Date of Birth/Sex: 02-24-1971 (50 y.o. F) Treating RN: Carlene Coria Primary Care Batoul Limes: Zenon Mayo Other Clinician: Referring Modesto Ganoe: Zenon Mayo Treating Laken Rog/Extender: Skipper Cliche in Treatment: 26 Wound Status Wound  Number: 9 Primary Calciphylaxis Etiology: Wound Location: Left, Lateral Calf Wound Open Wounding Event: Gradually Appeared Status: Date Acquired: 02/16/2021 Comorbid Cataracts, Glaucoma, Anemia, Hypertension, Type II Weeks Of Treatment: 15 History: Diabetes, End Stage Renal Disease, Neuropathy Clustered Wound: No Photos Wound Measurements Length: (cm) 11.6 Width: (cm) 6.5 Depth: (cm) 1.4 Area: (cm) 59.219 Volume: (cm) 82.907 % Reduction in Area: -756.8% % Reduction in Volume: -11898.1% Epithelialization: None Tunneling: No Undermining: No Wound Description Classification: Full Thickness With Exposed Support Structures Exudate Amount: Medium Exudate Type: Serosanguineous Exudate Color: red, brown Foul Odor After Cleansing: Yes Due to Product Use: No Slough/Fibrino Yes Wound Bed Granulation Amount: Small (1-33%) Exposed Structure Granulation Quality: Pink Fascia Exposed: Yes Necrotic  Amount: Large (67-100%) Fat Layer (Subcutaneous Tissue) Exposed: Yes Necrotic Quality: Eschar, Adherent Slough Tendon Exposed: Yes Muscle Exposed: No Joint Exposed: No Bone Exposed: No Electronic Signature(s) Signed: 06/07/2021 2:11:15 PM By: Carlene Coria RN Entered By: Carlene Coria on 06/04/2021 11:26:06 Amy Moses ([id number]) -------------------------------------------------------------------------------- Vitals Details Patient Name: Amy Moses Date of Service: 06/04/2021 11:00 AM Medical Record Number: [id number] Patient Account Number: [id number] Date of Birth/Sex: Sep 17, 1971 (50 y.o. F) Treating RN: Carlene Coria Primary Care Glady Ouderkirk: Zenon Mayo Other Clinician: Referring Tynleigh Birt: Zenon Mayo Treating Angad Nabers/Extender: Skipper Cliche in Treatment: 26 Vital Signs Time Taken: 11:19 Temperature (F): 98.4 Height (in): 66 Pulse (bpm): 81 Weight (lbs): 235 Respiratory Rate (breaths/min): 18 Body Mass Index (BMI): 37.9 Blood Pressure (mmHg): 148/83 Reference Range: 80 - 120 mg / dl Electronic Signature(s) Signed: 06/07/2021 2:11:15 PM By: Carlene Coria RN Entered By: Carlene Coria on 06/04/2021 11:19:36

## 2021-06-18 ENCOUNTER — Other Ambulatory Visit: Payer: Self-pay

## 2021-06-18 ENCOUNTER — Encounter: Payer: Medicaid - Out of State | Admitting: Physician Assistant

## 2021-06-18 DIAGNOSIS — E10622 Type 1 diabetes mellitus with other skin ulcer: Secondary | ICD-10-CM | POA: Insufficient documentation

## 2021-06-18 DIAGNOSIS — E1022 Type 1 diabetes mellitus with diabetic chronic kidney disease: Secondary | ICD-10-CM | POA: Insufficient documentation

## 2021-06-18 DIAGNOSIS — E1042 Type 1 diabetes mellitus with diabetic polyneuropathy: Secondary | ICD-10-CM | POA: Diagnosis not present

## 2021-06-18 DIAGNOSIS — N186 End stage renal disease: Secondary | ICD-10-CM | POA: Diagnosis not present

## 2021-06-18 DIAGNOSIS — L89610 Pressure ulcer of right heel, unstageable: Secondary | ICD-10-CM | POA: Insufficient documentation

## 2021-06-18 DIAGNOSIS — I120 Hypertensive chronic kidney disease with stage 5 chronic kidney disease or end stage renal disease: Secondary | ICD-10-CM | POA: Diagnosis not present

## 2021-06-18 DIAGNOSIS — L97812 Non-pressure chronic ulcer of other part of right lower leg with fat layer exposed: Secondary | ICD-10-CM | POA: Insufficient documentation

## 2021-06-18 DIAGNOSIS — L89323 Pressure ulcer of left buttock, stage 3: Secondary | ICD-10-CM | POA: Insufficient documentation

## 2021-06-18 DIAGNOSIS — L97822 Non-pressure chronic ulcer of other part of left lower leg with fat layer exposed: Secondary | ICD-10-CM | POA: Insufficient documentation

## 2021-06-18 DIAGNOSIS — L89313 Pressure ulcer of right buttock, stage 3: Secondary | ICD-10-CM | POA: Diagnosis not present

## 2021-06-18 DIAGNOSIS — Z794 Long term (current) use of insulin: Secondary | ICD-10-CM | POA: Diagnosis not present

## 2021-06-18 DIAGNOSIS — E1039 Type 1 diabetes mellitus with other diabetic ophthalmic complication: Secondary | ICD-10-CM | POA: Insufficient documentation

## 2021-06-18 DIAGNOSIS — H42 Glaucoma in diseases classified elsewhere: Secondary | ICD-10-CM | POA: Insufficient documentation

## 2021-06-18 DIAGNOSIS — Z992 Dependence on renal dialysis: Secondary | ICD-10-CM | POA: Insufficient documentation

## 2021-06-18 NOTE — Progress Notes (Signed)
JERELENE, SALAAM ([id number]) Visit Report for 06/18/2021 Arrival Information Details Patient Name: Amy Moses, Amy Moses Date of Service: 06/18/2021 10:15 AM Medical Record Number: [id number] Patient Account Number: [id number] Date of Birth/Sex: Dec 26, 1970 (50 y.o. F) Treating RN: Donnamarie Poag Primary Care Nick Stults: Zenon Mayo Other Clinician: Referring Hallelujah Wysong: Zenon Mayo Treating Gerda Yin/Extender: Skipper Cliche in Treatment: 28 Visit Information History Since Last Visit Added or deleted any medications: No Patient Arrived: Stretcher Had a fall or experienced change in No Arrival Time: 10:38 activities of daily living that may affect Accompanied By: self risk of falls: Transfer Assistance: Other Hospitalized since last visit: No Patient Identification Verified: Yes Has Dressing in Place as Prescribed: Yes Secondary Verification Process Completed: Yes Pain Present Now: No Patient Requires Transmission-Based No Precautions: Patient Has Alerts: Yes Patient Alerts: Patient on Blood Thinner ASPIRIN Electronic Signature(s) Signed: 06/18/2021 1:22:28 PM By: Donnamarie Poag Entered By: Donnamarie Poag on 06/18/2021 10:39:12 Amy Moses ([id number]) -------------------------------------------------------------------------------- Clinic Level of Care Assessment Details Patient Name: Amy Moses Date of Service: 06/18/2021 10:15 AM Medical Record Number: [id number] Patient Account Number: [id number] Date of Birth/Sex: Sep 23, 1971 (49 y.o. F) Treating RN: Donnamarie Poag Primary Care Vail Vuncannon: Zenon Mayo Other Clinician: Referring Rosan Calbert: Zenon Mayo Treating Faruq Rosenberger/Extender: Skipper Cliche in Treatment: 28 Clinic Level of Care Assessment Items TOOL 1 Quantity Score _0  - Use when EandM and Procedure is performed on INITIAL visit 0 ASSESSMENTS - Nursing Assessment / Reassessment _1  - General Physical Exam (combine w/ comprehensive assessment (listed just below) when  performed on new 0 pt. evals) _2  - 0 Comprehensive Assessment (HX, ROS, Risk Assessments, Wounds Hx, etc.) ASSESSMENTS - Wound and Skin Assessment / Reassessment _3  - Dermatologic / Skin Assessment (not related to wound area) 0 ASSESSMENTS - Ostomy and/or Continence Assessment and Care _4  - Incontinence Assessment and Management 0 _5  - 0 Ostomy Care Assessment and Management (repouching, etc.) PROCESS - Coordination of Care _6  - Simple Patient / Family Education for ongoing care 0 _7  - 0 Complex (extensive) Patient / Family Education for ongoing care _8  - 0 Staff obtains Programmer, systems, Records, Test Results / Process Orders _9  - 0 Staff telephones HHA, Nursing Homes / Clarify orders / etc _10  - 0 Routine Transfer to another Facility (non-emergent condition) _11  - 0 Routine Hospital Admission (non-emergent condition) _12  - 0 New Admissions / Biomedical engineer / Ordering NPWT, Apligraf, etc. _13  - 0 Emergency Hospital Admission (emergent condition) PROCESS - Special Needs _14  - Pediatric / Minor Patient Management 0 _15  - 0 Isolation Patient Management _16  - 0 Hearing / Language / Visual special needs _17  - 0 Assessment of Community assistance (transportation, D/C planning, etc.) _18  - 0 Additional assistance / Altered mentation _19  - 0 Support Surface(s) Assessment (bed, cushion, seat, etc.) INTERVENTIONS - Miscellaneous _20  - External ear exam 0 _21  - 0 Patient Transfer (multiple staff / Civil Service fast streamer / Similar devices) _22  - 0 Simple Staple / Suture removal (25 or less) _23  - 0 Complex Staple / Suture removal (26 or more) _24  - 0 Hypo/Hyperglycemic Management (do not check if billed separately) _25  - 0 Ankle / Brachial Index (ABI) - do not check if billed separately Has the patient been seen at the hospital within the last three years: Yes Total Score: 0 Level Of Care: ____ Amy Moses ([id number]) Electronic Signature(s) Signed: 06/18/2021 1:22:28 PM By: Donnamarie Poag Entered By: Donnamarie Poag on 06/18/2021 11:38:57 Amy Moses ([id number]) -------------------------------------------------------------------------------- Encounter Discharge Information Details Patient Name: Amy Moses Date of Service: 06/18/2021 10:15 AM Medical  Record Number: [id number] Patient Account Number: [id number] Date of Birth/Sex: 21-Apr-1971 (49 y.o. F) Treating RN: Donnamarie Poag Primary Care Avrom Robarts: Zenon Mayo Other Clinician: Referring Mckinzie Saksa: Zenon Mayo Treating Brexlee Heberlein/Extender: Skipper Cliche in Treatment: 28 Encounter Discharge Information Items Post Procedure Vitals Discharge Condition: Stable Temperature (F): 98.0 Ambulatory Status: Stretcher Pulse (bpm): 94 Discharge Destination: Home Respiratory Rate (breaths/min): 16 Transportation: Ambulance Blood Pressure (mmHg): 134/78 Accompanied By: self Schedule Follow-up Appointment: Yes Clinical Summary of Care: Electronic Signature(s) Signed: 06/18/2021 1:22:28 PM By: Donnamarie Poag Entered By: Donnamarie Poag on 06/18/2021 11:50:10 Amy Moses ([id number]) -------------------------------------------------------------------------------- Lower Extremity Assessment Details Patient Name: Amy Moses Date of Service: 06/18/2021 10:15 AM Medical Record Number: [id number] Patient Account Number: [id number] Date of Birth/Sex: Aug 01, 1971 (49 y.o. F) Treating RN: Donnamarie Poag Primary Care Tam Savoia: Zenon Mayo Other Clinician: Referring Lyah Millirons: Zenon Mayo Treating Derec Mozingo/Extender: Jeri Cos Weeks in Treatment: 28 Edema Assessment Assessed: [Left: Yes] [Right: Yes] [Left: Edema] [Right: :] Electronic Signature(s) Signed: 06/18/2021 1:22:28 PM By: Donnamarie Poag Entered By: Donnamarie Poag on 06/18/2021 11:19:26 Amy Moses ([id number]) -------------------------------------------------------------------------------- Multi Wound Chart Details Patient Name: Amy Moses Date of  Service: 06/18/2021 10:15 AM Medical Record Number: [id number] Patient Account Number: [id number] Date of Birth/Sex: 1971-09-07 (49 y.o. F) Treating RN: Donnamarie Poag Primary Care Peighton Edgin: Zenon Mayo Other Clinician: Referring Starsky Nanna: Zenon Mayo Treating Mazie Fencl/Extender: Skipper Cliche in Treatment: 28 Vital Signs Height(in): 66 Pulse(bpm): 94 Weight(lbs): 235 Blood Pressure(mmHg): 134/78 Body Mass Index(BMI): 38 Temperature(F): 98.0 Respiratory Rate(breaths/min): 16 Photos: Wound Location: Right, Medial Upper Leg Right, Lateral Calf Right, Distal, Posterior Lower Leg Wounding Event: Gradually Appeared Gradually Appeared Gradually Appeared Primary Etiology: Calciphylaxis Calciphylaxis Calciphylaxis Comorbid History: Cataracts, Glaucoma, Anemia, Cataracts, Glaucoma, Anemia, Cataracts, Glaucoma, Anemia, Hypertension, Type II Diabetes, End Hypertension, Type II Diabetes, End Hypertension, Type II Diabetes, End Stage Renal Disease, Neuropathy Stage Renal Disease, Neuropathy Stage Renal Disease, Neuropathy Date Acquired: 07/22/2020 02/16/2021 03/21/2021 Weeks of Treatment: _0 Wound Status: Open Open Open Clustered Wound: No No No Measurements L x W x D (cm) 0.2x0.2x0.1 1.8x1x0.4 9.5x7x0.4 Area (cm) : 0.031 1.414 52.229 Volume (cm) : 0.003 0.565 20.892 % Reduction in Area: 100.00% 34.50% -1230.00% % Reduction in Volume: 100.00% -161.60% -5216.00% Undermining: N/A No No Classification: Full Thickness Without Exposed Full Thickness Without Exposed Full Thickness Without Exposed Support Structures Support Structures Support Structures Exudate Amount: None Present Medium Medium Exudate Type: N/A Serosanguineous Serosanguineous Exudate Color: N/A red, brown red, brown Foul Odor After Cleansing: No Yes No Odor Anticipated Due to Product N/A No N/A Use: Granulation Amount: None Present (0%) Medium (34-66%) Medium (34-66%) Granulation Quality: N/A Pink Red Necrotic  Amount: Large (67-100%) Medium (34-66%) Medium (34-66%) Necrotic Tissue: [place] Exposed Structures: Fascia: No Fat Layer (Subcutaneous Tissue): Fat Layer (Subcutaneous Tissue): Fat Layer (Subcutaneous Tissue): Yes Yes No Fascia: No Fascia: No Tendon: No Tendon: No Tendon: No Muscle: No Muscle: No Muscle: No Joint: No Joint: No Joint: No Bone: No Bone: No Bone: No Epithelialization: Large (67-100%) None None Wound Number: _1 PhotosWALDINE, ZENZ ([id number]) Wound Location: Right Gluteus Left Gluteus Left Gluteal fold Wounding Event: Gradually Appeared Gradually Appeared Gradually Appeared Primary Etiology: Pressure Ulcer Pressure Ulcer Pressure Ulcer Comorbid History: Cataracts, Glaucoma, Anemia, Cataracts, Glaucoma, Anemia, Cataracts, Glaucoma, Anemia, Hypertension, Type II Diabetes, End Hypertension, Type II Diabetes, End Hypertension, Type II Diabetes, End Stage Renal Disease, Neuropathy Stage Renal Disease, Neuropathy Stage Renal Disease, Neuropathy Date Acquired: 05/31/2021 05/28/2021 06/02/2021 Weeks of Treatment: 2  2 0 Wound Status: Open Open Open Clustered Wound: No No Yes Measurements L x W x D (cm) 3x2.5x0.1 2.5x2x0.1 2.5x3x0.1 Area (cm) : 5.89 3.927 5.89 Volume (cm) : 0.589 0.393 0.589 % Reduction in Area: -188.40% -44.90% N/A % Reduction in Volume: -188.70% -45.00% N/A Undermining: No No No Classification: Category/Stage II Category/Stage II Category/Stage III Exudate Amount: Medium Medium Medium Exudate Type: Serosanguineous Serosanguineous Serosanguineous Exudate Color: red, brown red, brown red, brown Foul Odor After Cleansing: No No No Odor Anticipated Due to Product N/A N/A N/A Use: Granulation Amount: Medium (34-66%) Medium (34-66%) Medium (34-66%) Granulation Quality: Red Red N/A Necrotic Amount: Medium (34-66%) Medium (34-66%) Medium (34-66%) Necrotic Tissue: Adherent [place] Exposed Structures: Fat Layer (Subcutaneous Tissue): Fat Layer (Subcutaneous Tissue): N/A Yes Yes Fascia: No Fascia: No Tendon: No Tendon: No Muscle: No Muscle: No Joint: No Joint: No Bone: No Bone: No Epithelialization: None None N/A Wound Number: _0 Photos: Wound Location: Left, Medial Upper Leg Left, Lateral Upper Leg Right Calcaneus Wounding Event: Gradually Appeared Gradually Appeared Gradually Appeared Primary Etiology: Calciphylaxis Calciphylaxis Pressure Ulcer Comorbid History: Cataracts, Glaucoma, Anemia, Cataracts, Glaucoma, Anemia, Cataracts, Glaucoma, Anemia, Hypertension, Type II Diabetes, End Hypertension, Type II Diabetes, End Hypertension, Type II Diabetes, End Stage Renal Disease, Neuropathy Stage Renal Disease, Neuropathy Stage Renal Disease, Neuropathy Date Acquired: 07/22/2020 07/22/2020 10/21/2020 Weeks of Treatment: _1 Wound Status: Open Open Open Clustered Wound: No No No Measurements L x W x D (cm) 0.4x0.4x0.1 2x2x0.1 2x2.5x0.3 Area (cm) : 0.126 3.142 3.927 Volume (cm) : 0.013 0.314 1.178 % Reduction in Area: 99.90% 87.50% 86.10% % Reduction in Volume: 99.90% 87.50% 58.30% Starting Position 1 (o'clock): 5 Ending Position 1 (o'clock): 8 Maximum Distance 1 (cm): 3 Undermining: No N/A Yes Classification: Full Thickness Without Exposed Full Thickness Without Exposed Unstageable/Unclassified Support Structures Support Structures ELLAKATE, GONSALVES ([id number]) Exudate Amount: Medium Medium Large Exudate Type: Serosanguineous Serosanguineous Serosanguineous Exudate Color: red, brown red, brown red, brown Foul Odor After Cleansing: No No No Odor Anticipated Due to Product N/A N/A N/A Use: Granulation Amount: Small (1-33%) Medium (34-66%) Medium (34-66%) Granulation Quality: N/A N/A Red Necrotic Amount: Large (67-100%) Medium (34-66%) Medium (34-66%) Necrotic Tissue: Adherent [place] Exposed Structures: Fat  Layer (Subcutaneous Tissue): Fat Layer (Subcutaneous Tissue): Fat Layer (Subcutaneous Tissue): Yes Yes Yes Fascia: No Fascia: No Fascia: No Tendon: No Tendon: No Tendon: No Muscle: No Muscle: No Muscle: No Joint: No Joint: No Joint: No Bone: No Bone: No Bone: No Epithelialization: Small (1-33%) Large (67-100%) Small (1-33%) Wound Number: 9 N/A N/A Photos: N/A N/A Wound Location: Left, Lateral Calf N/A N/A Wounding Event: Gradually Appeared N/A N/A Primary Etiology: Calciphylaxis N/A N/A Comorbid History: Cataracts, Glaucoma, Anemia, N/A N/A Hypertension, Type II Diabetes, End Stage Renal Disease, Neuropathy Date Acquired: 02/16/2021 N/A N/A Weeks of Treatment: 17 N/A N/A Wound Status: Open N/A N/A Clustered Wound: No N/A N/A Measurements L x W x D (cm) 12x8x1.6 N/A N/A Area (cm) : 75.398 N/A N/A Volume (cm) : 120.637 N/A N/A % Reduction in Area: -990.80% N/A N/A % Reduction in Volume: -17358.30% N/A N/A Undermining: No N/A N/A Classification: Full Thickness With Exposed N/A N/A Support Structures Exudate Amount: Medium N/A N/A Exudate Type: Serosanguineous N/A N/A Exudate Color: red, brown N/A N/A Foul Odor After Cleansing: Yes N/A N/A Odor Anticipated Due to Product No N/A N/A Use: Granulation Amount: Small (1-33%) N/A N/A Granulation Quality: Pink N/A N/A Necrotic Amount: Large (67-100%)  N/A N/A Necrotic Tissue: Eschar, Adherent Slough N/A N/A Exposed Structures: Fascia: Yes N/A N/A Fat Layer (Subcutaneous Tissue): Yes Tendon: Yes Muscle: No Joint: No Bone: No Epithelialization: None N/A N/A Treatment Notes Electronic Signature(s) Signed: 06/18/2021 1:22:28 PM By: Thera Flake, Vito Berger ([id number]) Entered By: Donnamarie Poag on 06/18/2021 11:19:50 Amy Moses ([id number]) -------------------------------------------------------------------------------- [place] Details Patient Name: Amy Moses Date of Service:  06/18/2021 10:15 AM Medical Record Number: [id number] Patient Account Number: [id number] Date of Birth/Sex: December 23, 1970 (49 y.o. F) Treating RN: Donnamarie Poag Primary Care Kentavius Dettore: Zenon Mayo Other Clinician: Referring Rilda Bulls: Zenon Mayo Treating Robb Sibal/Extender: Skipper Cliche in Treatment: 28 Active Inactive Necrotic Tissue Nursing Diagnoses: Impaired tissue integrity related to necrotic/devitalized tissue Goals: Necrotic/devitalized tissue will be minimized in the wound bed Date Initiated: 12/02/2020 Target Resolution Date: 07/05/2021 Goal Status: Active Patient/caregiver will verbalize understanding of reason and process for debridement of necrotic tissue Date Initiated: 12/02/2020 Date Inactivated: 01/15/2021 Target Resolution Date: 01/02/2021 Goal Status: Met Interventions: Assess patient pain level pre-, during and post procedure and prior to discharge Provide education on necrotic tissue and debridement process Notes: Electronic Signature(s) Signed: 06/18/2021 1:22:28 PM By: Donnamarie Poag Entered ByDonnamarie Poag on 06/18/2021 11:19:37 Amy Moses ([id number]) -------------------------------------------------------------------------------- Pain Assessment Details Patient Name: Amy Moses Date of Service: 06/18/2021 10:15 AM Medical Record Number: [id number] Patient Account Number: [id number] Date of Birth/Sex: Apr 17, 1971 (49 y.o. F) Treating RN: Donnamarie Poag Primary Care Amenda Duclos: Zenon Mayo Other Clinician: Referring Alajiah Dutkiewicz: Zenon Mayo Treating Jaylean Buenaventura/Extender: Skipper Cliche in Treatment: 28 Active Problems Location of Pain Severity and Description of Pain Patient Has Paino No Site Locations Rate the pain. Current Pain Level: 0 Pain Management and Medication Current Pain Management: Electronic Signature(s) Signed: 06/18/2021 1:22:28 PM By: Donnamarie Poag Entered By: Donnamarie Poag on 06/18/2021 10:41:14 Amy Moses  ([id number]) -------------------------------------------------------------------------------- Patient/Caregiver Education Details Patient Name: Amy Moses Date of Service: 06/18/2021 10:15 AM Medical Record Number: [id number] Patient Account Number: [id number] Date of Birth/Gender: 1971-10-14 (49 y.o. F) Treating RN: Donnamarie Poag Primary Care Physician: Zenon Mayo Other Clinician: Referring Physician: Zenon Mayo Treating Physician/Extender: Skipper Cliche in Treatment: 82 Education Assessment Education Provided To: Patient Education Topics Provided Basic Hygiene: Nutrition: Offloading: Wound Debridement: Wound/Skin Impairment: Electronic Signature(s) Signed: 06/18/2021 1:22:28 PM By: Donnamarie Poag Entered By: Donnamarie Poag on 06/18/2021 11:40:01 Amy Moses ([id number]) -------------------------------------------------------------------------------- Wound Assessment Details Patient Name: Amy Moses Date of Service: 06/18/2021 10:15 AM Medical Record Number: [id number] Patient Account Number: [id number] Date of Birth/Sex: [birth year] (49 y.o. F) Treating RN: Donnamarie Poag Primary Care Sheanna Dail: Zenon Mayo Other Clinician: Referring Dayleen Beske: Zenon Mayo Treating Kwamane Whack/Extender: Skipper Cliche in Treatment: 28 Wound Status Wound Number: 1 Primary Calciphylaxis Etiology: Wound Location: Right, Medial Upper Leg Wound Open Wounding Event: Gradually Appeared Status: Date Acquired: 07/22/2020 Comorbid Cataracts, Glaucoma, Anemia, Hypertension, Type II Weeks Of Treatment: 28 History: Diabetes, End Stage Renal Disease, Neuropathy Clustered Wound: No Photos Wound Measurements Length: (cm) 0.2 Width: (cm) 0.2 Depth: (cm) 0.1 Area: (cm) 0.031 Volume: (cm) 0.003 % Reduction in Area: 100% % Reduction in Volume: 100% Epithelialization: Large (67-100%) Wound Description Classification: Full Thickness Without Exposed Support Structures Exudate  Amount: None Present Foul Odor After Cleansing: No Slough/Fibrino No Wound Bed Granulation Amount: None Present (0%) Exposed Structure Necrotic Amount: Large (67-100%) Fascia Exposed: No Necrotic Quality: Eschar Fat Layer (Subcutaneous Tissue) Exposed: No Tendon Exposed: No Muscle Exposed: No Joint Exposed: No Bone Exposed: No Treatment Notes Wound #1 (Upper Leg) Wound Laterality: Right, Medial Cleanser Normal Saline  Discharge Instruction: Wash your hands with soap and water. Remove old dressing, discard into plastic bag and place into trash. Cleanse the wound with Normal Saline prior to applying a clean dressing using gauze sponges, not tissues or cotton balls. Do not scrub or use excessive force. Pat dry using gauze sponges, not tissue or cotton balls. Peri-Wound Care VENDA, DICE ([id number]) Topical Primary Dressing Hydrofera Blue Ready Transfer Foam, 2.5x2.5 (in/in) Discharge Instruction: Apply Hydrofera Blue Ready to wound bed as directed Secondary Dressing ABD Pad 5x9 (in/in) Discharge Instruction: Cover with ABD pad Secured With 50M [place] Surgical Tape, 2x2 (in/yd) Compression Wrap Compression Stockings Add-Ons Electronic Signature(s) Signed: 06/18/2021 1:22:28 PM By: Donnamarie Poag Entered By: Donnamarie Poag on 06/18/2021 11:14:31 Amy Moses ([id number]) -------------------------------------------------------------------------------- Wound Assessment Details Patient Name: Amy Moses Date of Service: 06/18/2021 10:15 AM Medical Record Number: [id number] Patient Account Number: [id number] Date of Birth/Sex: 1971-10-04 (49 y.o. F) Treating RN: Donnamarie Poag Primary Care Deejay Koppelman: Zenon Mayo Other Clinician: Referring Raybon Conard: Zenon Mayo Treating Mashawn Brazil/Extender: Skipper Cliche in Treatment: 28 Wound Status Wound Number: 10 Primary Calciphylaxis Etiology: Wound Location: Right, Lateral Calf Wound Open Wounding Event: Gradually  Appeared Status: Date Acquired: 02/16/2021 Comorbid Cataracts, Glaucoma, Anemia, Hypertension, Type II Weeks Of Treatment: 17 History: Diabetes, End Stage Renal Disease, Neuropathy Clustered Wound: No Photos Wound Measurements Length: (cm) 1.8 Width: (cm) 1 Depth: (cm) 0.4 Area: (cm) 1.414 Volume: (cm) 0.565 % Reduction in Area: 34.5% % Reduction in Volume: -161.6% Epithelialization: None Tunneling: No Undermining: No Wound Description Classification: Full Thickness Without Exposed Support Structu Exudate Amount: Medium Exudate Type: Serosanguineous Exudate Color: red, brown res Foul Odor After Cleansing: Yes Due to Product Use: No Slough/Fibrino No Wound Bed Granulation Amount: Medium (34-66%) Exposed Structure Granulation Quality: Pink Fascia Exposed: No Necrotic Amount: Medium (34-66%) Fat Layer (Subcutaneous Tissue) Exposed: Yes Necrotic Quality: Adherent Slough Tendon Exposed: No Muscle Exposed: No Joint Exposed: No Bone Exposed: No Treatment Notes Wound #10 (Calf) Wound Laterality: Right, Lateral Cleanser Dakin 16 (oz) 0.25 Discharge Instruction: Use as directed. Peri-Wound Care JUANDA, LUBA ([id number]) Topical Primary Dressing Gauze Discharge Instruction: As directed: dry, moistened with saline or moistened with Dakins Solution Secondary Dressing ABD Pad 5x9 (in/in) Discharge Instruction: Cover with ABD pad Secured With 50M [place] Surgical Tape, 2x2 (in/yd) Conforming Stretch Gauze Bandage 4x75 (in/in) Discharge Instruction: Apply as directed Compression Wrap Compression Stockings Add-Ons Electronic Signature(s) Signed: 06/18/2021 1:22:28 PM By: Donnamarie Poag Entered By: Donnamarie Poag on 06/18/2021 11:11:01 Amy Moses ([id number]) -------------------------------------------------------------------------------- Wound Assessment Details Patient Name: Amy Moses Date of Service: 06/18/2021 10:15 AM Medical Record Number:  [id number] Patient Account Number: [id number] Date of Birth/Sex: 11/22/1970 (50 y.o. F) Treating RN: Donnamarie Poag Primary Care Tranae Laramie: Zenon Mayo Other Clinician: Referring Jimmie Dattilio: Zenon Mayo Treating Laretta Pyatt/Extender: Skipper Cliche in Treatment: 28 Wound Status Wound Number: 11 Primary Calciphylaxis Etiology: Wound Location: Right, Distal, Posterior Lower Leg Wound Open Wounding Event: Gradually Appeared Status: Date Acquired: 03/21/2021 Comorbid Cataracts, Glaucoma, Anemia, Hypertension, Type II Weeks Of Treatment: 10 History: Diabetes, End Stage Renal Disease, Neuropathy Clustered Wound: No Photos Wound Measurements Length: (cm) 9.5 Width: (cm) 7 Depth: (cm) 0.4 Area: (cm) 52.229 Volume: (cm) 20.892 % Reduction in Area: -1230% % Reduction in Volume: -5216% Epithelialization: None Tunneling: No Undermining: No Wound Description Classification: Full Thickness Without Exposed Support Structu Exudate Amount: Medium Exudate Type: Serosanguineous Exudate Color: red, brown res Foul Odor After Cleansing: No Slough/Fibrino Yes Wound Bed Granulation Amount: Medium (34-66%) Exposed Structure  Granulation Quality: Red Fascia Exposed: No Necrotic Amount: Medium (34-66%) Fat Layer (Subcutaneous Tissue) Exposed: Yes Necrotic Quality: Eschar, Adherent Slough Tendon Exposed: No Muscle Exposed: No Joint Exposed: No Bone Exposed: No Treatment Notes Wound #11 (Lower Leg) Wound Laterality: Right, Posterior, Distal Cleanser Dakin 16 (oz) 0.25 Discharge Instruction: Use as directed. Peri-Wound Care TEMPLE, EWART ([id number]) Topical Primary Dressing Gauze Discharge Instruction: As directed: dry, moistened with saline or moistened with Dakins Solution Secondary Dressing ABD Pad 5x9 (in/in) Discharge Instruction: Cover with ABD pad Secured With 64M [place] Surgical Tape, 2x2 (in/yd) Conforming Stretch Gauze Bandage 4x75 (in/in) Discharge  Instruction: Apply as directed Compression Wrap Compression Stockings Add-Ons Electronic Signature(s) Signed: 06/18/2021 1:22:28 PM By: Donnamarie Poag Entered By: Donnamarie Poag on 06/18/2021 11:11:46 Amy Moses ([id number]) -------------------------------------------------------------------------------- Wound Assessment Details Patient Name: Amy Moses Date of Service: 06/18/2021 10:15 AM Medical Record Number: [id number] Patient Account Number: [id number] Date of Birth/Sex: April 21, 1971 (50 y.o. F) Treating RN: Donnamarie Poag Primary Care Samel Bruna: Zenon Mayo Other Clinician: Referring Nisa Decaire: Zenon Mayo Treating Myria Steenbergen/Extender: Skipper Cliche in Treatment: 28 Wound Status Wound Number: 13 Primary Pressure Ulcer Etiology: Wound Location: Right Gluteus Wound Open Wounding Event: Gradually Appeared Status: Date Acquired: 05/31/2021 Comorbid Cataracts, Glaucoma, Anemia, Hypertension, Type II Weeks Of Treatment: 2 History: Diabetes, End Stage Renal Disease, Neuropathy Clustered Wound: No Photos Wound Measurements Length: (cm) 3 Width: (cm) 2.5 Depth: (cm) 0.1 Area: (cm) 5.89 Volume: (cm) 0.589 % Reduction in Area: -188.4% % Reduction in Volume: -188.7% Epithelialization: None Tunneling: No Undermining: No Wound Description Classification: Category/Stage II Exudate Amount: Medium Exudate Type: Serosanguineous Exudate Color: red, brown Foul Odor After Cleansing: No Slough/Fibrino Yes Wound Bed Granulation Amount: Medium (34-66%) Exposed Structure Granulation Quality: Red Fascia Exposed: No Necrotic Amount: Medium (34-66%) Fat Layer (Subcutaneous Tissue) Exposed: Yes Necrotic Quality: Adherent Slough Tendon Exposed: No Muscle Exposed: No Joint Exposed: No Bone Exposed: No Treatment Notes Wound #13 (Gluteus) Wound Laterality: Right Cleanser Normal Saline Discharge Instruction: Wash your hands with soap and water. Remove old dressing, discard  into plastic bag and place into trash. Cleanse the wound with Normal Saline prior to applying a clean dressing using gauze sponges, not tissues or cotton balls. Do not scrub or use excessive force. Pat dry using gauze sponges, not tissue or cotton balls. ROXSANA, RIDING ([id number]) Peri-Wound Care Topical Primary Dressing Hydrofera Blue Ready Transfer Foam, 2.5x2.5 (in/in) Discharge Instruction: Apply Hydrofera Blue Ready to wound bed as directed Secondary Dressing ABD Pad 5x9 (in/in) Discharge Instruction: Cover with ABD pad Secured With 64M [place] Surgical Tape, 2x2 (in/yd) Compression Wrap Compression Stockings Add-Ons Electronic Signature(s) Signed: 06/18/2021 1:22:28 PM By: Donnamarie Poag Entered By: Donnamarie Poag on 06/18/2021 11:17:33 Amy Moses ([id number]) -------------------------------------------------------------------------------- Wound Assessment Details Patient Name: Amy Moses Date of Service: 06/18/2021 10:15 AM Medical Record Number: [id number] Patient Account Number: [id number] Date of Birth/Sex: 1971/09/27 (49 y.o. F) Treating RN: Donnamarie Poag Primary Care Arlenis Blaydes: Zenon Mayo Other Clinician: Referring Shams Fill: Zenon Mayo Treating Joanmarie Tsang/Extender: Skipper Cliche in Treatment: 28 Wound Status Wound Number: 14 Primary Pressure Ulcer Etiology: Wound Location: Left Gluteus Wound Open Wounding Event: Gradually Appeared Status: Date Acquired: 05/28/2021 Comorbid Cataracts, Glaucoma, Anemia, Hypertension, Type II Weeks Of Treatment: 2 History: Diabetes, End Stage Renal Disease, Neuropathy Clustered Wound: No Photos Wound Measurements Length: (cm) 2.5 Width: (cm) 2 Depth: (cm) 0.1 Area: (cm) 3.927 Volume: (cm) 0.393 % Reduction in Area: -44.9% % Reduction in Volume: -45% Epithelialization: None Tunneling: No Undermining: No Wound  Description Classification: Category/Stage II Exudate Amount: Medium Exudate Type:  Serosanguineous Exudate Color: red, brown Foul Odor After Cleansing: No Slough/Fibrino Yes Wound Bed Granulation Amount: Medium (34-66%) Exposed Structure Granulation Quality: Red Fascia Exposed: No Necrotic Amount: Medium (34-66%) Fat Layer (Subcutaneous Tissue) Exposed: Yes Necrotic Quality: Adherent Slough Tendon Exposed: No Muscle Exposed: No Joint Exposed: No Bone Exposed: No Treatment Notes Wound #14 (Gluteus) Wound Laterality: Left Cleanser Normal Saline Discharge Instruction: Wash your hands with soap and water. Remove old dressing, discard into plastic bag and place into trash. Cleanse the wound with Normal Saline prior to applying a clean dressing using gauze sponges, not tissues or cotton balls. Do not scrub or use excessive force. Pat dry using gauze sponges, not tissue or cotton balls. ADELFA, LOZITO ([id number]) Peri-Wound Care Topical Primary Dressing Hydrofera Blue Ready Transfer Foam, 2.5x2.5 (in/in) Discharge Instruction: Apply Hydrofera Blue Ready to wound bed as directed Secondary Dressing ABD Pad 5x9 (in/in) Discharge Instruction: Cover with ABD pad Secured With 25M [place] Surgical Tape, 2x2 (in/yd) Compression Wrap Compression Stockings Add-Ons Electronic Signature(s) Signed: 06/18/2021 1:22:28 PM By: Donnamarie Poag Entered By: Donnamarie Poag on 06/18/2021 11:16:50 Amy Moses ([id number]) -------------------------------------------------------------------------------- Wound Assessment Details Patient Name: Amy Moses Date of Service: 06/18/2021 10:15 AM Medical Record Number: [id number] Patient Account Number: [id number] Date of Birth/Sex: [birth year] (49 y.o. F) Treating RN: Donnamarie Poag Primary Care Hardie Veltre: Zenon Mayo Other Clinician: Referring Dana Debo: Zenon Mayo Treating Kyson Kupper/Extender: Skipper Cliche in Treatment: 28 Wound Status Wound Number: 15 Primary Pressure Ulcer Etiology: Wound Location: Left  Gluteal fold Wound Open Wounding Event: Gradually Appeared Status: Date Acquired: 06/02/2021 Comorbid Cataracts, Glaucoma, Anemia, Hypertension, Type II Weeks Of Treatment: 0 History: Diabetes, End Stage Renal Disease, Neuropathy Clustered Wound: Yes Photos Wound Measurements Length: (cm) 2.5 % Reduc Width: (cm) 3 % Reduc Depth: (cm) 0.1 Tunneli Area: (cm) 5.89 Underm Volume: (cm) 0.589 tion in Area: tion in Volume: ng: No ining: No Wound Description Classification: Category/Stage III Foul O Exudate Amount: Medium Slough Exudate Type: Serosanguineous Exudate Color: red, brown dor After Cleansing: No /Fibrino Yes Wound Bed Granulation Amount: Medium (34-66%) Necrotic Amount: Medium (34-66%) Necrotic Quality: Adherent Slough Treatment Notes Wound #15 (Gluteal fold) Wound Laterality: Left Cleanser Peri-Wound Care Topical Primary Dressing Secondary Dressing Secured With TORRY, ADAMCZAK ([id number]) Compression Wrap Compression Stockings Add-Ons Electronic Signature(s) Signed: 06/18/2021 1:22:28 PM By: Donnamarie Poag Entered By: Donnamarie Poag on 06/18/2021 11:18:58 Amy Moses ([id number]) -------------------------------------------------------------------------------- Wound Assessment Details Patient Name: Amy Moses Date of Service: 06/18/2021 10:15 AM Medical Record Number: [id number] Patient Account Number: [id number] Date of Birth/Sex: [birth year] (49 y.o. F) Treating RN: Donnamarie Poag Primary Care Jaynie Hitch: Zenon Mayo Other Clinician: Referring Josemaria Brining: Zenon Mayo Treating Kengo Sturges/Extender: Skipper Cliche in Treatment: 28 Wound Status Wound Number: 2 Primary Calciphylaxis Etiology: Wound Location: Left, Medial Upper Leg Wound Open Wounding Event: Gradually Appeared Status: Date Acquired: 07/22/2020 Comorbid Cataracts, Glaucoma, Anemia, Hypertension, Type II Weeks Of Treatment: 28 History: Diabetes, End Stage Renal Disease,  Neuropathy Clustered Wound: No Photos Wound Measurements Length: (cm) 0.4 Width: (cm) 0.4 Depth: (cm) 0.1 Area: (cm) 0.126 Volume: (cm) 0.013 % Reduction in Area: 99.9% % Reduction in Volume: 99.9% Epithelialization: Small (1-33%) Tunneling: No Undermining: No Wound Description Classification: Full Thickness Without Exposed Support Structures Exudate Amount: Medium Exudate Type: Serosanguineous Exudate Color: red, brown Foul Odor After Cleansing: No Slough/Fibrino Yes Wound Bed Granulation Amount: Small (1-33%) Exposed Structure Necrotic Amount: Large (67-100%) Fascia Exposed: No Necrotic Quality: Adherent Slough Fat Layer (Subcutaneous  Tissue) Exposed: Yes Tendon Exposed: No Muscle Exposed: No Joint Exposed: No Bone Exposed: No Treatment Notes Wound #2 (Upper Leg) Wound Laterality: Left, Medial Cleanser Normal Saline Discharge Instruction: Wash your hands with soap and water. Remove old dressing, discard into plastic bag and place into trash. Cleanse the wound with Normal Saline prior to applying a clean dressing using gauze sponges, not tissues or cotton balls. Do not scrub or use excessive force. Pat dry using gauze sponges, not tissue or cotton balls. KIRSTINA, LEINWEBER ([id number]) Peri-Wound Care Topical Primary Dressing Hydrofera Blue Ready Transfer Foam, 2.5x2.5 (in/in) Discharge Instruction: Apply Hydrofera Blue Ready to wound bed as directed Secondary Dressing ABD Pad 5x9 (in/in) Discharge Instruction: Cover with ABD pad Secured With 68M [place] Surgical Tape, 2x2 (in/yd) Compression Wrap Compression Stockings Add-Ons Electronic Signature(s) Signed: 06/18/2021 1:22:28 PM By: Donnamarie Poag Entered By: Donnamarie Poag on 06/18/2021 11:15:22 Amy Moses ([id number]) -------------------------------------------------------------------------------- Wound Assessment Details Patient Name: Amy Moses Date of Service: 06/18/2021 10:15  AM Medical Record Number: [id number] Patient Account Number: [id number] Date of Birth/Sex: 1970-12-10 (49 y.o. F) Treating RN: Donnamarie Poag Primary Care Sherie Dobrowolski: Zenon Mayo Other Clinician: Referring Aquilla Voiles: Zenon Mayo Treating Alliene Klugh/Extender: Skipper Cliche in Treatment: 28 Wound Status Wound Number: 3 Primary Calciphylaxis Etiology: Wound Location: Left, Lateral Upper Leg Wound Open Wounding Event: Gradually Appeared Status: Date Acquired: 07/22/2020 Comorbid Cataracts, Glaucoma, Anemia, Hypertension, Type II Weeks Of Treatment: 28 History: Diabetes, End Stage Renal Disease, Neuropathy Clustered Wound: No Photos Wound Measurements Length: (cm) 2 Width: (cm) 2 Depth: (cm) 0.1 Area: (cm) 3.142 Volume: (cm) 0.314 % Reduction in Area: 87.5% % Reduction in Volume: 87.5% Epithelialization: Large (67-100%) Wound Description Classification: Full Thickness Without Exposed Support Structu Exudate Amount: Medium Exudate Type: Serosanguineous Exudate Color: red, brown res Foul Odor After Cleansing: No Slough/Fibrino Yes Wound Bed Granulation Amount: Medium (34-66%) Exposed Structure Necrotic Amount: Medium (34-66%) Fascia Exposed: No Necrotic Quality: Adherent Slough Fat Layer (Subcutaneous Tissue) Exposed: Yes Tendon Exposed: No Muscle Exposed: No Joint Exposed: No Bone Exposed: No Treatment Notes Wound #3 (Upper Leg) Wound Laterality: Left, Lateral Cleanser Dakin 16 (oz) 0.25 Discharge Instruction: Use as directed. Peri-Wound Care MACKENIZE, DELGADILLO ([id number]) Topical Primary Dressing Gauze Discharge Instruction: As directed: dry, moistened with saline or moistened with Dakins Solution Secondary Dressing ABD Pad 5x9 (in/in) Discharge Instruction: Cover with ABD pad Secured With 68M [place] Surgical Tape, 2x2 (in/yd) Conforming Stretch Gauze Bandage 4x75 (in/in) Discharge Instruction: Apply as directed Compression Wrap Compression  Stockings Add-Ons Electronic Signature(s) Signed: 06/18/2021 1:22:28 PM By: Donnamarie Poag Entered By: Donnamarie Poag on 06/18/2021 11:13:25 Amy Moses ([id number]) -------------------------------------------------------------------------------- Wound Assessment Details Patient Name: Amy Moses Date of Service: 06/18/2021 10:15 AM Medical Record Number: [id number] Patient Account Number: [id number] Date of Birth/Sex: 1971/08/01 (49 y.o. F) Treating RN: Donnamarie Poag Primary Care Jaedynn Bohlken: Zenon Mayo Other Clinician: Referring Vasily Fedewa: Zenon Mayo Treating Gedalia Mcmillon/Extender: Skipper Cliche in Treatment: 28 Wound Status Wound Number: 4 Primary Pressure Ulcer Etiology: Wound Location: Right Calcaneus Wound Open Wounding Event: Gradually Appeared Status: Date Acquired: 10/21/2020 Comorbid Cataracts, Glaucoma, Anemia, Hypertension, Type II Weeks Of Treatment: 28 History: Diabetes, End Stage Renal Disease, Neuropathy Clustered Wound: No Photos Wound Measurements Length: (cm) 2 % Reduc Width: (cm) 2.5 % Reduc Depth: (cm) 0.3 Epithel Area: (cm) 3.927 Tunnel Volume: (cm) 1.178 Underm Star Endi Maxi tion in Area: 86.1% tion in Volume: 58.3% ialization: Small (1-33%) ing: No ining: Yes ting Position (o'clock): 5 ng Position (o'clock): 8 mum  Distance: (cm) 3 Wound Description Classification: Unstageable/Unclassified Foul O Exudate Amount: Large Slough Exudate Type: Serosanguineous Exudate Color: red, brown dor After Cleansing: No /Fibrino Yes Wound Bed Granulation Amount: Medium (34-66%) Exposed Structure Granulation Quality: Red Fascia Exposed: No Necrotic Amount: Medium (34-66%) Fat Layer (Subcutaneous Tissue) Exposed: Yes Necrotic Quality: Adherent Slough Tendon Exposed: No Muscle Exposed: No Joint Exposed: No Bone Exposed: No Treatment Notes Wound #4 (Calcaneus) Wound Laterality: Right Cleanser Ivery, Vito Berger ([id number]) Peri-Wound  Care Topical Primary Dressing Hydrofera Blue Ready Transfer Foam, 2.5x2.5 (in/in) Discharge Instruction: Apply Hydrofera Blue Ready to wound bed as directed Secondary Dressing ABD Pad 5x9 (in/in) Discharge Instruction: Cover with ABD pad Secured With 76M Medipore H Soft Cloth Surgical Tape, 2x2 (in/yd) Conforming Stretch Gauze Bandage 4x75 (in/in) Discharge Instruction: Apply as directed Compression Wrap Compression Stockings Add-Ons Electronic Signature(s) Signed: 06/18/2021 1:22:28 PM By: Donnamarie Poag Entered By: Donnamarie Poag on 06/18/2021 11:09:39 Amy Moses ([id number]) -------------------------------------------------------------------------------- Wound Assessment Details Patient Name: Amy Moses Date of Service: 06/18/2021 10:15 AM Medical Record Number: [id number] Patient Account Number: [id number] Date of Birth/Sex: 20-Jun-1971 (49 y.o. F) Treating RN: Donnamarie Poag Primary Care Fable Huisman: Zenon Mayo Other Clinician: Referring Autym Siess: Zenon Mayo Treating Trennon Torbeck/Extender: Skipper Cliche in Treatment: 28 Wound Status Wound Number: 9 Primary Calciphylaxis Etiology: Wound Location: Left, Lateral Calf Wound Open Wounding Event: Gradually Appeared Status: Date Acquired: 02/16/2021 Comorbid Cataracts, Glaucoma, Anemia, Hypertension, Type II Weeks Of Treatment: 17 History: Diabetes, End Stage Renal Disease, Neuropathy Clustered Wound: No Photos Wound Measurements Length: (cm) 12 Width: (cm) 8 Depth: (cm) 1.6 Area: (cm) 75.398 Volume: (cm) 120.637 % Reduction in Area: -990.8% % Reduction in Volume: -17358.3% Epithelialization: None Tunneling: No Undermining: No Wound Description Classification: Full Thickness With Exposed Support Structure Exudate Amount: Medium Exudate Type: Serosanguineous Exudate Color: red, brown s Foul Odor After Cleansing: Yes Due to Product Use: No Slough/Fibrino Yes Wound Bed Granulation Amount: Small (1-33%)  Exposed Structure Granulation Quality: Pink Fascia Exposed: Yes Necrotic Amount: Large (67-100%) Fat Layer (Subcutaneous Tissue) Exposed: Yes Necrotic Quality: Eschar, Adherent Slough Tendon Exposed: Yes Muscle Exposed: No Joint Exposed: No Bone Exposed: No Treatment Notes Wound #9 (Calf) Wound Laterality: Left, Lateral Cleanser Dakin 16 (oz) 0.25 Discharge Instruction: Use as directed. Peri-Wound Care SHATANA, SAXTON ([id number]) Topical Primary Dressing Gauze Discharge Instruction: As directed: dry, moistened with saline or moistened with Dakins Solution Secondary Dressing ABD Pad 5x9 (in/in) Discharge Instruction: Cover with ABD pad Secured With 76M [place] Surgical Tape, 2x2 (in/yd) Conforming Stretch Gauze Bandage 4x75 (in/in) Discharge Instruction: Apply as directed Compression Wrap Compression Stockings Add-Ons Electronic Signature(s) Signed: 06/18/2021 1:22:28 PM By: Donnamarie Poag Entered By: Donnamarie Poag on 06/18/2021 11:12:44 Amy Moses ([id number]) -------------------------------------------------------------------------------- [place] Details Patient Name: Amy Moses Date of Service: 06/18/2021 10:15 AM Medical Record Number: [id number] Patient Account Number: [id number] Date of Birth/Sex: February 07, 1971 (50 y.o. F) Treating RN: Donnamarie Poag Primary Care Annasophia Crocker: Zenon Mayo Other Clinician: Referring Rivan Siordia: Zenon Mayo Treating Mlissa Tamayo/Extender: Skipper Cliche in Treatment: 28 Vital Signs Time Taken: 10:39 Temperature (F): 98.0 Height (in): 66 Pulse (bpm): 94 Weight (lbs): 235 Respiratory Rate (breaths/min): 16 Body Mass Index (BMI): 37.9 Blood Pressure (mmHg): 134/78 Reference Range: 80 - 120 mg / dl Electronic Signature(s) Signed: 06/18/2021 1:22:28 PM By: Donnamarie Poag Entered ByDonnamarie Poag on 06/18/2021 10:41:06

## 2021-06-18 NOTE — Progress Notes (Addendum)
Amy Moses, Amy Moses ([id number]) Visit Report for 06/18/2021 Chief Complaint Document Details Patient Name: Amy Moses, Amy Moses Date of Service: 06/18/2021 10:15 AM Medical Record Number: [id number] Patient Account Number: [id number] Date of Birth/Sex: [birth year] (50 y.o. F) Treating RN: Donnamarie Poag Primary Care Provider: Zenon Mayo Other Clinician: Referring Provider: Zenon Mayo Treating Provider/Extender: Skipper Cliche in Treatment: 28 Information Obtained from: Patient Chief Complaint 12/02/2020; patient is here for review of extensive wounds on her bilateral thighs as well as an area on her right plantar heel Electronic Signature(s) Signed: 06/18/2021 11:21:52 AM By: Worthy Keeler PA-C Entered By: Worthy Keeler on 06/18/2021 11:21:51 Amy Moses ([id number]) -------------------------------------------------------------------------------- Debridement Details Patient Name: Amy Moses Date of Service: 06/18/2021 10:15 AM Medical Record Number: [id number] Patient Account Number: [id number] Date of Birth/Sex: 25-Aug-1971 (49 y.o. F) Treating RN: Donnamarie Poag Primary Care Provider: Zenon Mayo Other Clinician: Referring Provider: Zenon Mayo Treating Provider/Extender: Skipper Cliche in Treatment: 28 Debridement Performed for Wound #4 Right Calcaneus Assessment: Performed By: Physician Tommie Sams., PA-C Debridement Type: Debridement Level of Consciousness (Pre- Awake and Alert procedure): Pre-procedure Verification/Time Out Yes - 11:31 Taken: Start Time: 11:31 Pain Control: Lidocaine Total Area Debrided (L x W): 2 (cm) x 3 (cm) = 6 (cm) Tissue and other material Non-Viable, Callus, Slough, Subcutaneous, Slough debrided: Level: Skin/Subcutaneous Tissue Debridement Description: Excisional Instrument: Forceps, Scissors Bleeding: Moderate Hemostasis Achieved: Pressure Response to Treatment: Procedure was tolerated well Level of Consciousness  (Post- Awake and Alert procedure): Post Debridement Measurements of Total Wound Length: (cm) 5 Stage: Unstageable/Unclassified Width: (cm) 2.5 Depth: (cm) 0.3 Volume: (cm) 2.945 Character of Wound/Ulcer Post Debridement: Improved Post Procedure Diagnosis Same as Pre-procedure Electronic Signature(s) Signed: 06/18/2021 1:22:28 PM By: Donnamarie Poag Signed: 06/18/2021 4:21:57 PM By: Worthy Keeler PA-C Entered By: Donnamarie Poag on 06/18/2021 11:34:48 Amy Moses ([id number]) -------------------------------------------------------------------------------- Debridement Details Patient Name: Amy Moses Date of Service: 06/18/2021 10:15 AM Medical Record Number: [id number] Patient Account Number: [id number] Date of Birth/Sex: 08/01/1971 (50 y.o. F) Treating RN: Donnamarie Poag Primary Care Provider: Zenon Mayo Other Clinician: Referring Provider: Zenon Mayo Treating Provider/Extender: Skipper Cliche in Treatment: 28 Debridement Performed for Wound #9 Left,Lateral Calf Assessment: Performed By: Physician Tommie Sams., PA-C Debridement Type: Debridement Level of Consciousness (Pre- Awake and Alert procedure): Pre-procedure Verification/Time Out Yes - 11:31 Taken: Start Time: 11:31 Pain Control: Lidocaine Total Area Debrided (L x W): 5 (cm) x 1 (cm) = 5 (cm) Tissue and other material Non-Viable, Eschar, Muscle, Slough, Subcutaneous, Slough debrided: Level: Skin/Subcutaneous Tissue/Muscle Debridement Description: Excisional Instrument: Forceps, Scissors Bleeding: Moderate Hemostasis Achieved: Pressure Response to Treatment: Procedure was tolerated well Level of Consciousness (Post- Awake and Alert procedure): Post Debridement Measurements of Total Wound Length: (cm) 12 Width: (cm) 8 Depth: (cm) 1.6 Volume: (cm) 120.637 Character of Wound/Ulcer Post Debridement: Improved Post Procedure Diagnosis Same as Pre-procedure Electronic Signature(s) Signed:  06/18/2021 1:22:28 PM By: Donnamarie Poag Signed: 06/18/2021 4:21:57 PM By: Worthy Keeler PA-C Entered By: Donnamarie Poag on 06/18/2021 11:36:22 Amy Moses ([id number]) -------------------------------------------------------------------------------- HPI Details Patient Name: Amy Moses Date of Service: 06/18/2021 10:15 AM Medical Record Number: [id number] Patient Account Number: [id number] Date of Birth/Sex: March 16, 1971 (50 y.o. F) Treating RN: Donnamarie Poag Primary Care Provider: Zenon Mayo Other Clinician: Referring Provider: Zenon Mayo Treating Provider/Extender: Skipper Cliche in Treatment: 28 History of Present Illness HPI Description: ADMISSION 12/02/2020 This is a 50 year old woman who is a type II diabetic. Although there are mentions of type 1 diabetes in epic clearly this woman  is type II based on the fact that she was on oral agents for 10 years before starting insulin. She also is in chronic renal failure and recently started on dialysis I think in November. She relates her problems starting in September she started to develop skin excoriation on her bilateral inner thighs. She thought this was a friction phenomenon however the tissue wrist can progressively broke down. She was seen by her primary doctor on 10/21/2020 noted to have erythema on both thighs medially and a blister. MRIs were ordered and she was put on Silvadene and gauze. She was seen in the ER on 12/12 at the [place] [facility] also noted to have a right unstageable heel wound. 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 She dialyzes at [place] in [facility] and she was started on sodium thiosulfate as far as the notes state. The patient states she gets something at dialysis every day although she has not exactly sure what they are giving her. She was noted by vein and vascular in [place] to have multiple open wounds on 11/19/2020 using Xeroform  gauze. She had an angiogram booked by Dr. Oneida Alar predominantly I think because of the right heel ulcer although this was canceled by the patient because of the weather. The patient has large necrotic wounds on both inner thighs with covering black eschar. There is also an area on the left lateral thigh and an eschared area on her right plantar heel. She has been using Betadine to the right heel Xeroform to the areas on her legs. The patient has Medicaid but is able to get the Xeroform, were not really sure how she is managing this although she lives in [place] and there may be different rules for Medicaid in [place] versus [place]. We certainly would not be able to get that here. 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 I wondered whether her neuropathy is particularly affected her sensation of pain since her recent left patella fracture does not seem to have been that painful either Past medical history includes stage V chronic renal failure starting on dialysis I think in November, clearly type 2 diabetes with neuropathy, hypertension, glaucoma, vitamin D deficiency, history of cholecystectomy, edema of both legs, hypothyroidism, she dialyzes Tuesday Thursday and Saturday at [facility] in [place] The patient had arterial studies on 11/19/2020 I think at vein and vascular in [place]. She had biphasic waveforms throughout the thigh on the right and at the popliteal proximally and distally in the ATA but the PTA and peroneal were not visualized. On the left again biphasic waveforms up to the level of the distal popliteal but not visualized in the tibial vessels. She was felt to have adequate flow where visualized. As noted she was supposed to have an angiogram which I think is certainly indicated [id number] upon evaluation today patient actually appears to be doing okay in regard to her wounds. This is  actually the first time of seeing her she saw Dr. Dellia Nims at the last visit she does have quite an extensive history based on review. With that being said I do not see any signs right now of active infection which is great news. Overall I am extremely pleased with where things stand in that regard. With that being said I do not believe the Xeroform is doing much for the calciphylaxis areas on her thighs and lateral or  medial locations. I really feel like she may do better with Dakin's moistened gauze. I do think that we can see about ordering the Dakin's for her she is already using gauze and then wrapping with roll gauze anyway so really would not change much except for moistening some gauze and applying it to the wound bed. With that being said I do not see any signs of active infection at this time which is great news. The patient all in all seems to be in fairly good spirits all things considered. 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 Fortunately it looks like some of the eschar is loosening up so we should be able to get this removed which hopefully will help in speeding up the healing process. With that being said with regard to the patient's gluteal region she unfortunately has new pressure ulcerations open today which I think could benefit from possibly having a air mattress. With that being said I explained to the patient that unfortunately this could still take some time as far as getting it to heal completely. There does not appear to be any signs of active infection at this time which is great news. No fevers, chills, nausea, vomiting, or diarrhea. 02/16/2021 upon evaluation today patient appears to be doing about the same in regard to her heel. Her gluteal area is actually getting better. She did have surgery in regard to her thighs bilaterally as well as her  knee. It was not until she got into the operating room that it was noted that she had the wounds on the thighs. Subsequently according to the surgeons note dated 02/01/2021 they contacted the patient's daughter in order to discuss the fact they felt she needed to have the wound surgically debrided and a wound VAC placed and subsequently instead of patellar reconstruction they opted to proceed with removal of the Portion of the patella in order to get this area to heal more effectively and quickly. Overall that seems to have done quite well. 02/26/2021 upon evaluation today patient's wounds actually appear to be doing excellent pretty much across the board I am very pleased. With that being said the wounds where she has the wound vacs placed on the thighs in particular seems to be doing a very good job as far as healing is concerned. There does not appear to be any evidence of infection which is great news and overall I am extremely pleased in that regard. No fevers, chills, nausea, vomiting, or diarrhea. 03/12/21 upon evaluation today patient appears to be doing well with regard to her wounds. I think she is doing excellent with the wound VAC and very pleased in that regard. Fortunately there does not appear to be any signs of infection I am that a try to do a little bit of light debridement today due to some necrotic regions noted in her wounds to try to help speed up the healing process here. 04/05/2021 upon evaluation today patient appears to be doing decently well in general in regard to her wounds. She was unfortunately in the hospital from the 10th through the 12th of this month due to having high fever and subsequently they did not actually determine exactly what was going on they thought she might of just been sick or had some kind of a cold. With that being said fortunately there does not appear to be any evidence of active  infection at this time which is great news. No fevers, chills, nausea,  vomiting, or diarrhea. Amy Moses, Amy Moses ([id number]) 05/03/2021 upon evaluation today patient actually appears to be doing excellent for the most part in regard to the wounds on her thighs. I am extremely happy with what I see today. In fact I feel like it is probably time for [name] to discontinue the use of the wound VAC as this appears to be doing so well. The patient voiced an understanding and agreement and is definitely happy to get rid of the wound VAC. She was recently in the hospital but this was secondary to something unrelated to her wound she had a polyp which was bleeding and had to be taken care of this was causing significant issues she has since been taken off of her aspirin. 05/21/2021 upon evaluation today patient appears to be doing well with regard to her wound. She has been tolerating the dressing changes without complication with regard to the [facility] in the upper thigh region. We have been using Dakin's moistened gauze on the legs and the heel. Fortunately everything seems to be making good progress here in general which is great news. The unfortunate thing is that we have been having issues here with some necrotic tissue and drainage on the lateral portions of her legs. Fortunately there does not appear to be any signs of systemic infection although locally there may be a little bit of infection here based on the drainage which is somewhat blue-green in nature. I am going to perform a culture postdebridement of the right calf/lower extremity region. She tolerated that today without complication. I did perform that culture as well. 06/04/2021 upon evaluation today patient appears to be doing well with regard to the wounds on her thighs in general. Fortunately there does not appear to be any signs of active infection which is great news I am very pleased in that regard. In regard to her lower extremities I do feel like that she is making some good progress here as well. This  includes the use of the Dakin's moistened gauze which has done a great job as far as keeping the wounds clean at this point. Overall I am extremely happy with where we stand. 06/18/2021 upon evaluation today patient appears to be doing a little better in regard to most of her wounds in general. She does have a couple areas again and needs some debridement as far as the left lateral leg and the right heel are concerned. Otherwise she also has a small area of fluid on the right medial thigh where this is mostly healed but nonetheless there is a small opening that is of concern here. Fortunately there is no evidence of active infection at this time. No fevers, chills, nausea, vomiting, or diarrhea. Electronic Signature(s) Signed: 06/18/2021 11:38:48 AM By: Worthy Keeler PA-C Entered By: Worthy Keeler on 06/18/2021 11:38:47 Amy Moses ([id number]) -------------------------------------------------------------------------------- Otelia Sergeant TISS Details Patient Name: Amy Moses Date of Service: 06/18/2021 10:15 AM Medical Record Number: [id number] Patient Account Number: [id number] Date of Birth/Sex: November 02, 1971 (49 y.o. F) Treating RN: Donnamarie Poag Primary Care Provider: Zenon Mayo Other Clinician: Referring Provider: Zenon Mayo Treating Provider/Extender: Skipper Cliche in Treatment: 28 Procedure Performed for: Wound #3 Left,Lateral Upper Leg Performed By: Physician Tommie Sams., PA-C Post Procedure Diagnosis Same as Pre-procedure Notes tolerated well, one silver nitrate stick used Electronic Signature(s) Signed: 06/18/2021 1:22:28 PM By: Donnamarie Poag Entered ByDonnamarie Poag on 06/18/2021 11:26:20  Amy Moses, Amy Moses ([id number]) -------------------------------------------------------------------------------- Physical Exam Details Patient Name: Amy Moses, Amy Moses Date of Service: 06/18/2021 10:15 AM Medical Record Number: [id number] Patient Account Number:  [id number] Date of Birth/Sex: 1970-12-18 (50 y.o. F) Treating RN: Donnamarie Poag Primary Care Provider: Zenon Mayo Other Clinician: Referring Provider: Zenon Mayo Treating Provider/Extender: Skipper Cliche in Treatment: 76 Constitutional Well-nourished and well-hydrated in no acute distress. Respiratory normal breathing without difficulty. Psychiatric this patient is able to make decisions and demonstrates good insight into disease process. Alert and Oriented x 3. pleasant and cooperative. Notes Upon inspection patient's wounds actually did require some sharp debridement regard to the left lateral leg as well as the right heel. I removed necrotic muscle in the left lateral leg and on the heel some callus and subcutaneous tissue. Fortunately there does not appear to be any signs in general of worsening. I feel like that the patient is making some good progress here although is very slow she does have quite a bit of issues when it comes to her overall medical status. Electronic Signature(s) Signed: 06/18/2021 1:04:42 PM By: Worthy Keeler PA-C Entered By: Worthy Keeler on 06/18/2021 13:04:42 Amy Moses ([id number]) -------------------------------------------------------------------------------- Physician Orders Details Patient Name: Amy Moses Date of Service: 06/18/2021 10:15 AM Medical Record Number: [id number] Patient Account Number: [id number] Date of Birth/Sex: 03-24-1971 (49 y.o. F) Treating RN: Donnamarie Poag Primary Care Provider: Zenon Mayo Other Clinician: Referring Provider: Zenon Mayo Treating Provider/Extender: Skipper Cliche in Treatment: 64 Verbal / Phone Orders: No Diagnosis Coding ICD-10 Coding Code Description E83.59 Other disorders of calcium metabolism L97.118 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 L97.128 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 L89.610 Pressure ulcer of  right heel, unstageable E11.42 Type 2 diabetes mellitus with diabetic polyneuropathy L89.313 Pressure ulcer of right buttock, stage 3 L89.323 Pressure ulcer of left buttock, stage 3 L97.822 Non-pressure chronic ulcer of other part of left lower leg with fat layer exposed L97.812 Non-pressure chronic ulcer of other part of right lower leg with fat layer exposed Follow-up Appointments o Return Appointment in 2 weeks. [place] Orders/Instructions: - Home Health Not Available Off-Loading o Gel wheelchair cushion o Air fluidized (Group 3) o Turn and reposition every 2 hours - Recommend PREVALON BOOTS WHEN IN BED FOR OFFLOADING PRESSURE OFF LEG WOUNDS Wound Treatment Wound #1 - Upper Leg Wound Laterality: Right, Medial Cleanser: Normal Saline 3 x Per Week/30 Days Discharge Instructions: Wash your hands with soap and water. Remove old dressing, discard into plastic bag and place into trash. Cleanse the wound with Normal Saline prior to applying a clean dressing using gauze sponges, not tissues or cotton balls. Do not scrub or use excessive force. Pat dry using gauze sponges, not tissue or cotton balls. Primary Dressing: Hydrofera Blue Ready Transfer Foam, 2.5x2.5 (in/in) 3 x Per Week/30 Days Discharge Instructions: Apply Hydrofera Blue Ready to wound bed as directed Secondary Dressing: ABD Pad 5x9 (in/in) 3 x Per Week/30 Days Discharge Instructions: Cover with ABD pad Secured With: 66M Medipore H Soft Cloth Surgical Tape, 2x2 (in/yd) 3 x Per Week/30 Days Wound #10 - Calf Wound Laterality: Right, Lateral Cleanser: Dakin 16 (oz) 0.25 1 x Per Day/30 Days Discharge Instructions: Use as directed. Primary Dressing: Gauze 1 x Per Day/30 Days Discharge Instructions: As directed: dry, moistened with saline or moistened with Dakins Solution Secondary Dressing: ABD Pad 5x9 (in/in) 1 x Per Day/30 Days Discharge Instructions: Cover with ABD pad Secured With:  38M Medipore H  Soft Cloth Surgical Tape, 2x2 (in/yd) 1 x Per Day/30 Days Secured With: Conforming Stretch Gauze Bandage 4x75 (in/in) 1 x Per Day/30 Days Discharge Instructions: Apply as directed Amy Moses, Amy Moses ([id number]) Wound #11 - Lower Leg Wound Laterality: Right, Posterior, Distal Cleanser: Dakin 16 (oz) 0.25 1 x Per Day/30 Days Discharge Instructions: Use as directed. Primary Dressing: Gauze 1 x Per Day/30 Days Discharge Instructions: As directed: dry, moistened with saline or moistened with Dakins Solution Secondary Dressing: ABD Pad 5x9 (in/in) 1 x Per Day/30 Days Discharge Instructions: Cover with ABD pad Secured With: 38M Medipore H Soft Cloth Surgical Tape, 2x2 (in/yd) 1 x Per Day/30 Days Secured With: Conforming Stretch Gauze Bandage 4x75 (in/in) 1 x Per Day/30 Days Discharge Instructions: Apply as directed Wound #13 - Gluteus Wound Laterality: Right Cleanser: Normal Saline 3 x Per Week/30 Days Discharge Instructions: Wash your hands with soap and water. Remove old dressing, discard into plastic bag and place into trash. Cleanse the wound with Normal Saline prior to applying a clean dressing using gauze sponges, not tissues or cotton balls. Do not scrub or use excessive force. Pat dry using gauze sponges, not tissue or cotton balls. Primary Dressing: Hydrofera Blue Ready Transfer Foam, 2.5x2.5 (in/in) 3 x Per Week/30 Days Discharge Instructions: Apply Hydrofera Blue Ready to wound bed as directed Secondary Dressing: ABD Pad 5x9 (in/in) 3 x Per Week/30 Days Discharge Instructions: Cover with ABD pad Secured With: 38M Medipore H Soft Cloth Surgical Tape, 2x2 (in/yd) 3 x Per Week/30 Days Wound #14 - Gluteus Wound Laterality: Left Cleanser: Normal Saline 3 x Per Week/30 Days Discharge Instructions: Wash your hands with soap and water. Remove old dressing, discard into plastic bag and place into trash. Cleanse the wound with Normal Saline prior to applying a clean dressing using gauze sponges, not  tissues or cotton balls. Do not scrub or use excessive force. Pat dry using gauze sponges, not tissue or cotton balls. Primary Dressing: Hydrofera Blue Ready Transfer Foam, 2.5x2.5 (in/in) 3 x Per Week/30 Days Discharge Instructions: Apply Hydrofera Blue Ready to wound bed as directed Secondary Dressing: ABD Pad 5x9 (in/in) 3 x Per Week/30 Days Discharge Instructions: Cover with ABD pad Secured With: 38M Medipore H Soft Cloth Surgical Tape, 2x2 (in/yd) 3 x Per Week/30 Days Wound #2 - Upper Leg Wound Laterality: Left, Medial Cleanser: Normal Saline 3 x Per Week/30 Days Discharge Instructions: Wash your hands with soap and water. Remove old dressing, discard into plastic bag and place into trash. Cleanse the wound with Normal Saline prior to applying a clean dressing using gauze sponges, not tissues or cotton balls. Do not scrub or use excessive force. Pat dry using gauze sponges, not tissue or cotton balls. Primary Dressing: Hydrofera Blue Ready Transfer Foam, 2.5x2.5 (in/in) 3 x Per Week/30 Days Discharge Instructions: Apply Hydrofera Blue Ready to wound bed as directed Secondary Dressing: ABD Pad 5x9 (in/in) 3 x Per Week/30 Days Discharge Instructions: Cover with ABD pad Secured With: 38M Medipore H Soft Cloth Surgical Tape, 2x2 (in/yd) 3 x Per Week/30 Days Wound #3 - Upper Leg Wound Laterality: Left, Lateral Cleanser: Dakin 16 (oz) 0.25 1 x Per Day/30 Days Discharge Instructions: Use as directed. Primary Dressing: Gauze 1 x Per Day/30 Days Discharge Instructions: As directed: dry, moistened with saline or moistened with Dakins Solution Amy Moses, Amy Moses ([id number]) Secondary Dressing: ABD Pad 5x9 (in/in) 1 x Per Day/30 Days Discharge Instructions: Cover with ABD pad Secured With: 38M Medipore H Soft Cloth Surgical Tape,  2x2 (in/yd) 1 x Per Day/30 Days Secured With: Conforming Stretch Gauze Bandage 4x75 (in/in) 1 x Per Day/30 Days Discharge Instructions: Apply as directed Wound #4 -  Calcaneus Wound Laterality: Right Primary Dressing: Hydrofera Blue Ready Transfer Foam, 2.5x2.5 (in/in) 1 x Per Day/30 Days Discharge Instructions: Apply Hydrofera Blue Ready to wound bed as directed Secondary Dressing: ABD Pad 5x9 (in/in) 1 x Per Day/30 Days Discharge Instructions: Cover with ABD pad Secured With: 536M Medipore H Soft Cloth Surgical Tape, 2x2 (in/yd) 1 x Per Day/30 Days Secured With: Conforming Stretch Gauze Bandage 4x75 (in/in) 1 x Per Day/30 Days Discharge Instructions: Apply as directed Wound #9 - Calf Wound Laterality: Left, Lateral Cleanser: Dakin 16 (oz) 0.25 1 x Per Day/30 Days Discharge Instructions: Use as directed. Primary Dressing: Gauze 1 x Per Day/30 Days Discharge Instructions: As directed: dry, moistened with saline or moistened with Dakins Solution Secondary Dressing: ABD Pad 5x9 (in/in) 1 x Per Day/30 Days Discharge Instructions: Cover with ABD pad Secured With: 536M Medipore H Soft Cloth Surgical Tape, 2x2 (in/yd) 1 x Per Day/30 Days Secured With: Conforming Stretch Gauze Bandage 4x75 (in/in) 1 x Per Day/30 Days Discharge Instructions: Apply as directed Electronic Signature(s) Signed: 06/18/2021 1:22:28 PM By: Donnamarie Poag Signed: 06/18/2021 4:21:57 PM By: Worthy Keeler PA-C Entered By: Donnamarie Poag on 06/18/2021 11:38:49 Amy Moses ([id number]) -------------------------------------------------------------------------------- Problem List Details Patient Name: Amy Moses Date of Service: 06/18/2021 10:15 AM Medical Record Number: [id number] Patient Account Number: [id number] Date of Birth/Sex: 04-05-1971 (49 y.o. 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 12/02/2020 No Yes L97.128 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 12/02/2020 No Yes L89.610 Pressure ulcer of right heel, unstageable 12/02/2020 No Yes E11.42 Type 2 diabetes mellitus with diabetic polyneuropathy 12/02/2020 No Yes L89.313 Pressure ulcer of right buttock, stage 3 01/19/2021 No Yes L89.323 Pressure ulcer of left buttock, stage 3 01/19/2021 No Yes L97.822 Non-pressure chronic ulcer of other part of left lower leg with fat layer 05/03/2021 No Yes exposed L97.812 Non-pressure chronic ulcer of other part of right lower leg with fat layer 05/03/2021 No Yes exposed Inactive Problems Resolved Problems Electronic Signature(s) Signed: 06/18/2021 11:21:45 AM By: Worthy Keeler PA-C Entered By: Worthy Keeler on 06/18/2021 11:21:45 Amy Moses ([id number]) -------------------------------------------------------------------------------- Progress Note Details Patient Name: Amy Moses Date of Service: 06/18/2021 10:15 AM Medical Record Number: [id number] Patient Account Number: [id number] Date of Birth/Sex: 08-Sep-1971 (49 y.o. F) Treating RN: Donnamarie Poag Primary Care Provider: Zenon Mayo Other Clinician: Referring Provider: Zenon Mayo Treating Provider/Extender: Skipper Cliche in Treatment: 28 Subjective Chief Complaint Information obtained from Patient 12/02/2020; patient is here for review of extensive wounds on her bilateral thighs as well as an area on her right plantar heel History of Present Illness (HPI) ADMISSION 12/02/2020 This is a 50 year old woman who is a type II diabetic. Although there are mentions of type 1 diabetes in epic clearly this woman is type II based on the fact that she was on oral agents for 10 years before starting insulin. She also is in chronic renal failure and recently started on dialysis I think in November. She relates her problems  starting in September she started to develop skin excoriation on  her bilateral inner thighs. She thought this was a friction phenomenon however the tissue wrist can progressively broke down. She was seen by her primary doctor on 10/21/2020 noted to have erythema on both thighs medially and a blister. MRIs were ordered and she was put on Silvadene and gauze. She was seen in the ER on 12/12 at the [place] [facility] also noted to have a right unstageable heel wound. 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 She dialyzes at [facility] and she was started on sodium thiosulfate as far as the notes state. The patient states she gets something at dialysis every day although she has not exactly sure what they are giving her. She was noted by vein and vascular in [place] to have multiple open wounds on 11/19/2020 using Xeroform gauze. She had an angiogram booked by Dr. Oneida Alar predominantly I think because of the right heel ulcer although this was canceled by the patient because of the weather. The patient has large necrotic wounds on both inner thighs with covering black eschar. There is also an area on the left lateral thigh and an eschared area on her right plantar heel. She has been using Betadine to the right heel Xeroform to the areas on her legs. The patient has Medicaid but is able to get the Xeroform, were not really sure how she is managing this although she lives in [place] and there may be different rules for Medicaid in [place] versus [place]. We certainly would not be able to get that here. 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 I wondered whether her neuropathy is particularly affected her sensation of pain since her recent left patella fracture does not seem to have been that painful either Past medical history includes stage V  chronic renal failure starting on dialysis I think in November, clearly type 2 diabetes with neuropathy, hypertension, glaucoma, vitamin D deficiency, history of cholecystectomy, edema of both legs, hypothyroidism, she dialyzes Tuesday Thursday and Saturday at [facility] in [place] The patient had arterial studies on 11/19/2020 I think at vein and vascular in [place]. She had biphasic waveforms throughout the thigh on the right and at the popliteal proximally and distally in the ATA but the PTA and peroneal were not visualized. On the left again biphasic waveforms up to the level of the distal popliteal but not visualized in the tibial vessels. She was felt to have adequate flow where visualized. As noted she was supposed to have an angiogram which I think is certainly indicated [id number] upon evaluation today patient actually appears to be doing okay in regard to her wounds. This is actually the first time of seeing her she saw Dr. Dellia Nims at the last visit she does have quite an extensive history based on review. With that being said I do not see any signs right now of active infection which is great news. Overall I am extremely pleased with where things stand in that regard. 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 I really feel like she may do better with Dakin's moistened gauze. I do think that we can see about ordering the Dakin's for her she is already using gauze and then wrapping with roll gauze anyway so really would not change much except for moistening some gauze  and applying it to the wound bed. With that being said I do not see any signs of active infection at this time which is great news. The patient all in all seems to be in fairly good spirits all things considered. 01/15/2021 upon inspection today patient's wound bed actually showed signs of still having significant eschar over the  areas of calciphylaxis in regard to her thigh regions. Fortunately it looks like some of the eschar is loosening up so we should be able to get this removed which hopefully will help in speeding up the healing process. With that being said with regard to the patient's gluteal region she unfortunately has new pressure ulcerations open today which I think could benefit from possibly having a air mattress. With that being said I explained to the patient that unfortunately this could still take some time as far as getting it to heal completely. There does not appear to be any signs of active infection at this time which is great news. No fevers, chills, nausea, vomiting, or diarrhea. 02/16/2021 upon evaluation today patient appears to be doing about the same in regard to her heel. Her gluteal area is actually getting better. She did have surgery in regard to her thighs bilaterally as well as her knee. It was not until she got into the operating room that it was noted that she had the wounds on the thighs. Subsequently according to the surgeons note dated 02/01/2021 they contacted the patient's daughter in order to discuss the fact they felt she needed to have the wound surgically debrided and a wound VAC placed and subsequently instead of patellar reconstruction they opted to proceed with removal of the Portion of the patella in order to get this area to heal more effectively and quickly. Overall that seems to have done quite well. 02/26/2021 upon evaluation today patient's wounds actually appear to be doing excellent pretty much across the board I am very pleased. With that being said the wounds where she has the wound vacs placed on the thighs in particular seems to be doing a very good job as far as healing is concerned. There does not appear to be any evidence of infection which is great news and overall I am extremely pleased in that regard. No fevers, chills, nausea, vomiting, or diarrhea. 03/12/21 upon  evaluation today patient appears to be doing well with regard to her wounds. I think she is doing excellent with the wound VAC and very pleased in that regard. Fortunately there does not appear to be any signs of infection I am that a try to do a little bit of light debridement today due to some necrotic regions noted in her wounds to try to help speed up the healing process here. Amy Moses, Amy Moses ([id number]) 04/05/2021 upon evaluation today patient appears to be doing decently well in general in regard to her wounds. She was unfortunately in the hospital from the 10th through the 12th of this month due to having high fever and subsequently they did not actually determine exactly what was going on they thought she might of just been sick or had some kind of a cold. With that being said fortunately there does not appear to be any evidence of active infection at this time which is great news. No fevers, chills, nausea, vomiting, or diarrhea. 05/03/2021 upon evaluation today patient actually appears to be doing excellent for the most part in regard to the wounds on her thighs. I am extremely happy with what I see  today. In fact I feel like it is probably time for [name] to discontinue the use of the wound VAC as this appears to be doing so well. The patient voiced an understanding and agreement and is definitely happy to get rid of the wound VAC. She was recently in the hospital but this was secondary to something unrelated to her wound she had a polyp which was bleeding and had to be taken care of this was causing significant issues she has since been taken off of her aspirin. 05/21/2021 upon evaluation today patient appears to be doing well with regard to her wound. She has been tolerating the dressing changes without complication with regard to the [facility] in the upper thigh region. We have been using Dakin's moistened gauze on the legs and the heel. Fortunately everything seems to be making good  progress here in general which is great news. The unfortunate thing is that we have been having issues here with some necrotic tissue and drainage on the lateral portions of her legs. Fortunately there does not appear to be any signs of systemic infection although locally there may be a little bit of infection here based on the drainage which is somewhat blue-green in nature. I am going to perform a culture postdebridement of the right calf/lower extremity region. She tolerated that today without complication. I did perform that culture as well. 06/04/2021 upon evaluation today patient appears to be doing well with regard to the wounds on her thighs in general. Fortunately there does not appear to be any signs of active infection which is great news I am very pleased in that regard. In regard to her lower extremities I do feel like that she is making some good progress here as well. This includes the use of the Dakin's moistened gauze which has done a great job as far as keeping the wounds clean at this point. Overall I am extremely happy with where we stand. 06/18/2021 upon evaluation today patient appears to be doing a little better in regard to most of her wounds in general. She does have a couple areas again and needs some debridement as far as the left lateral leg and the right heel are concerned. Otherwise she also has a small area of fluid on the right medial thigh where this is mostly healed but nonetheless there is a small opening that is of concern here. Fortunately there is no evidence of active infection at this time. No fevers, chills, nausea, vomiting, or diarrhea. Objective Constitutional Well-nourished and well-hydrated in no acute distress. Vitals Time Taken: 10:39 AM, Height: 66 in, Weight: 235 lbs, BMI: 37.9, Temperature: 98.0 F, Pulse: 94 bpm, Respiratory Rate: 16 breaths/min, Blood Pressure: 134/78 mmHg. Respiratory normal breathing without difficulty. Psychiatric this  patient is able to make decisions and demonstrates good insight into disease process. Alert and Oriented x 3. pleasant and cooperative. General Notes: Upon inspection patient's wounds actually did require some sharp debridement regard to the left lateral leg as well as the right heel. I removed necrotic muscle in the left lateral leg and on the heel some callus and subcutaneous tissue. Fortunately there does not appear to be any signs in general of worsening. I feel like that the patient is making some good progress here although is very slow she does have quite a bit of issues when it comes to her overall medical status. Integumentary (Hair, Skin) Wound #1 status is Open. Original cause of wound was Gradually Appeared. The date acquired was:  07/22/2020. The wound has been in treatment 28 weeks. The wound is located on the Right,Medial Upper Leg. The wound measures 0.2cm length x 0.2cm width x 0.1cm depth; 0.031cm^2 area and 0.003cm^3 volume. There is a none present amount of drainage noted. There is no granulation within the wound bed. There is a large (67-100%) amount of necrotic tissue within the wound bed including Eschar. Wound #10 status is Open. Original cause of wound was Gradually Appeared. The date acquired was: 02/16/2021. The wound has been in treatment 17 weeks. The wound is located on the Right,Lateral Calf. The wound measures 1.8cm length x 1cm width x 0.4cm depth; 1.414cm^2 area and 0.565cm^3 volume. There is Fat Layer (Subcutaneous Tissue) exposed. There is no tunneling or undermining noted. There is a medium amount of serosanguineous drainage noted. Foul odor after cleansing was noted. There is medium (34-66%) pink granulation within the wound bed. There is a medium (34-66%) amount of necrotic tissue within the wound bed including Adherent Slough. Wound #11 status is Open. Original cause of wound was Gradually Appeared. The date acquired was: 03/21/2021. The wound has been in  treatment 10 weeks. The wound is located on the Right,Distal,Posterior Lower Leg. The wound measures 9.5cm length x 7cm width x 0.4cm depth; 52.229cm^2 area and 20.892cm^3 volume. There is Fat Layer (Subcutaneous Tissue) exposed. There is no tunneling or undermining noted. There is a medium amount of serosanguineous drainage noted. There is medium (34-66%) red granulation within the wound bed. There is a medium (34-66%) amount of necrotic tissue within the wound bed including Eschar and Adherent Slough. Amy Moses, Amy Moses ([id number]) Wound #13 status is Open. Original cause of wound was Gradually Appeared. The date acquired was: 05/31/2021. The wound has been in treatment 2 weeks. The wound is located on the Right Gluteus. The wound measures 3cm length x 2.5cm width x 0.1cm depth; 5.89cm^2 area and 0.589cm^3 volume. There is Fat Layer (Subcutaneous Tissue) exposed. There is no tunneling or undermining noted. There is a medium amount of serosanguineous drainage noted. There is medium (34-66%) red granulation within the wound bed. There is a medium (34-66%) amount of necrotic tissue within the wound bed including Adherent Slough. Wound #14 status is Open. Original cause of wound was Gradually Appeared. The date acquired was: 05/28/2021. The wound has been in treatment 2 weeks. The wound is located on the Left Gluteus. The wound measures 2.5cm length x 2cm width x 0.1cm depth; 3.927cm^2 area and 0.393cm^3 volume. There is Fat Layer (Subcutaneous Tissue) exposed. There is no tunneling or undermining noted. There is a medium amount of serosanguineous drainage noted. There is medium (34-66%) red granulation within the wound bed. There is a medium (34-66%) amount of necrotic tissue within the wound bed including Adherent Slough. Wound #15 status is Open. Original cause of wound was Gradually Appeared. The date acquired was: 06/02/2021. The wound is located on the Left Gluteal fold. The wound measures 2.5cm  length x 3cm width x 0.1cm depth; 5.89cm^2 area and 0.589cm^3 volume. There is no tunneling or undermining noted. There is a medium amount of serosanguineous drainage noted. There is medium (34-66%) granulation within the wound bed. There is a medium (34-66%) amount of necrotic tissue within the wound bed including Adherent Slough. Wound #2 status is Open. Original cause of wound was Gradually Appeared. The date acquired was: 07/22/2020. The wound has been in treatment 28 weeks. The wound is located on the Left,Medial Upper Leg. The wound measures 0.4cm length x 0.4cm width x 0.1cm depth;  0.126cm^2 area and 0.013cm^3 volume. There is Fat Layer (Subcutaneous Tissue) exposed. There is no tunneling or undermining noted. There is a medium amount of serosanguineous drainage noted. There is small (1-33%) granulation within the wound bed. There is a large (67-100%) amount of necrotic tissue within the wound bed including Adherent Slough. Wound #3 status is Open. Original cause of wound was Gradually Appeared. The date acquired was: 07/22/2020. The wound has been in treatment 28 weeks. The wound is located on the Left,Lateral Upper Leg. The wound measures 2cm length x 2cm width x 0.1cm depth; 3.142cm^2 area and 0.314cm^3 volume. There is Fat Layer (Subcutaneous Tissue) exposed. There is a medium amount of serosanguineous drainage noted. There is medium (34-66%) granulation within the wound bed. There is a medium (34-66%) amount of necrotic tissue within the wound bed including Adherent Slough. Wound #4 status is Open. Original cause of wound was Gradually Appeared. The date acquired was: 10/21/2020. The wound has been in treatment 28 weeks. The wound is located on the Right Calcaneus. The wound measures 2cm length x 2.5cm width x 0.3cm depth; 3.927cm^2 area and 1.178cm^3 volume. There is Fat Layer (Subcutaneous Tissue) exposed. There is no tunneling noted, however, there is undermining starting at 5:00 and ending  at 8:00 with a maximum distance of 3cm. There is a large amount of serosanguineous drainage noted. There is medium (34- 66%) red granulation within the wound bed. There is a medium (34-66%) amount of necrotic tissue within the wound bed including Adherent Slough. Wound #9 status is Open. Original cause of wound was Gradually Appeared. The date acquired was: 02/16/2021. The wound has been in treatment 17 weeks. The wound is located on the Left,Lateral Calf. The wound measures 12cm length x 8cm width x 1.6cm depth; 75.398cm^2 area and 120.637cm^3 volume. There is tendon, Fat Layer (Subcutaneous Tissue), and fascia exposed. There is no tunneling or undermining noted. There is a medium amount of serosanguineous drainage noted. Foul odor after cleansing was noted. There is small (1-33%) pink granulation within the wound bed. There is a large (67-100%) amount of necrotic tissue within the wound bed including Eschar and Adherent Slough. 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 There was a Excisional Skin/Subcutaneous Tissue Debridement with a total area of 6 sq cm performed by Tommie Sams., PA-C. With the following instrument(s): Forceps, and Scissors to remove Non-Viable tissue/material. Material removed includes Callus, Subcutaneous Tissue, and Slough after achieving pain control  using Lidocaine. A time out was conducted at 11:31, prior to the start of the procedure. A Moderate amount of bleeding was controlled with Pressure. The procedure was tolerated well. Post Debridement Measurements: 5cm length x 2.5cm width x 0.3cm depth; 2.945cm^3 volume. Post debridement Stage noted as Unstageable/Unclassified. Character of Wound/Ulcer Post Debridement is improved. Post procedure Diagnosis Wound #4: Same as Pre-Procedure Amy Moses, Amy Moses ([id number]) Wound #9 Pre-procedure diagnosis of Wound #9 is a 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 There was  a Excisional Skin/Subcutaneous Tissue/Muscle Debridement with a total area of 5 sq cm performed by Tommie Sams., PA-C. With the following instrument(s): Forceps, and Scissors to remove Non-Viable tissue/material. Material removed includes Muscle, Eschar, Subcutaneous Tissue, and Slough after achieving pain control using Lidocaine. A time out was conducted at 11:31, prior to the start of the procedure. A Moderate amount of bleeding was controlled with Pressure. The procedure was tolerated well. Post Debridement Measurements: 12cm length x 8cm width x 1.6cm depth; 120.637cm^3 volume. Character of Wound/Ulcer Post Debridement is improved. Post procedure Diagnosis Wound #9: Same as Pre-Procedure Wound #3 Pre-procedure diagnosis of Wound #3 is a Calciphylaxis located on the Left,Lateral Upper Leg . An CHEM CAUT GRANULATION TISS procedure was performed by Tommie Sams., PA-C. Post procedure Diagnosis Wound #3: Same as Pre-Procedure Notes: tolerated well, one silver nitrate stick used Plan Follow-up Appointments: Return Appointment in 2 weeks. Home Health: Other Home Health Orders/Instructions: - Home Health Not Available Off-Loading: Gel wheelchair cushion Air fluidized (Group 3) Turn and reposition every 2 hours - Recommend PRE[employer]OTS WHEN IN BED FOR OFFLOADING PRESSURE OFF LEG WOUNDS WOUND #1: - Upper Leg Wound  Laterality: Right, Medial Cleanser: Normal Saline 3 x Per Week/30 Days Discharge Instructions: Wash your hands with soap and water. Remove old dressing, discard into plastic bag and place into trash. Cleanse the wound with Normal Saline prior to applying a clean dressing using gauze sponges, not tissues or cotton balls. Do not scrub or use excessive force. Pat dry using gauze sponges, not tissue or cotton balls. Primary Dressing: Hydrofera Blue Ready Transfer Foam, 2.5x2.5 (in/in) 3 x Per Week/30 Days Discharge Instructions: Apply Hydrofera Blue Ready to wound bed as directed Secondary Dressing: ABD Pad 5x9 (in/in) 3 x Per Week/30 Days Discharge Instructions: Cover with ABD pad Secured With: 67M Medipore H Soft Cloth Surgical Tape, 2x2 (in/yd) 3 x Per Week/30 Days WOUND #10: - Calf Wound Laterality: Right, Lateral Cleanser: Dakin 16 (oz) 0.25 1 x Per Day/30 Days Discharge Instructions: Use as directed. Primary Dressing: Gauze 1 x Per Day/30 Days Discharge Instructions: As directed: dry, moistened with saline or moistened with Dakins Solution Secondary Dressing: ABD Pad 5x9 (in/in) 1 x Per Day/30 Days Discharge Instructions: Cover with ABD pad Secured With: 67M Medipore H Soft Cloth Surgical Tape, 2x2 (in/yd) 1 x Per Day/30 Days Secured With: Conforming Stretch Gauze Bandage 4x75 (in/in) 1 x Per Day/30 Days Discharge Instructions: Apply as directed WOUND #11: - Lower Leg Wound Laterality: Right, Posterior, Distal Cleanser: Dakin 16 (oz) 0.25 1 x Per Day/30 Days Discharge Instructions: Use as directed. Primary Dressing: Gauze 1 x Per Day/30 Days Discharge Instructions: As directed: dry, moistened with saline or moistened with Dakins Solution Secondary Dressing: ABD Pad 5x9 (in/in) 1 x Per Day/30 Days Discharge Instructions: Cover with ABD pad Secured With: 67M Medipore H Soft Cloth Surgical Tape, 2x2 (in/yd) 1 x Per Day/30 Days Secured With: Conforming Stretch Gauze Bandage 4x75 (in/in) 1 x  Per Day/30 Days Discharge Instructions: Apply as directed WOUND #13: - Gluteus Wound Laterality: Right Cleanser: Normal Saline 3 x Per Week/30 Days Discharge Instructions: Wash your hands with soap and water. Remove old dressing, discard into plastic bag and place into trash. Cleanse the wound with Normal Saline prior to applying a clean dressing using gauze sponges, not tissues or cotton balls. Do not scrub or use excessive force. Pat dry using gauze sponges, not tissue or cotton balls. Primary Dressing: Hydrofera Blue Ready Transfer Foam, 2.5x2.5 (in/in)  3 x Per Week/30 Days Discharge Instructions: Apply Hydrofera Blue Ready to wound bed as directed Secondary Dressing: ABD Pad 5x9 (in/in) 3 x Per Week/30 Days Discharge Instructions: Cover with ABD pad Secured With: 36M Medipore H Soft Cloth Surgical Tape, 2x2 (in/yd) 3 x Per Week/30 Days WOUND #14: - Gluteus Wound Laterality: Left Cleanser: Normal Saline 3 x Per Week/30 Days Discharge Instructions: Wash your hands with soap and water. Remove old dressing, discard into plastic bag and place into trash. Cleanse the wound with Normal Saline prior to applying a clean dressing using gauze sponges, not tissues or cotton balls. Do not scrub or use excessive force. Pat dry using gauze sponges, not tissue or cotton balls. Primary Dressing: Hydrofera Blue Ready Transfer Foam, 2.5x2.5 (in/in) 3 x Per Week/30 Days Discharge Instructions: Apply Hydrofera Blue Ready to wound bed as directed Amy Moses, Amy Moses ([id number]) Secondary Dressing: ABD Pad 5x9 (in/in) 3 x Per Week/30 Days Discharge Instructions: Cover with ABD pad Secured With: 36M Medipore H Soft Cloth Surgical Tape, 2x2 (in/yd) 3 x Per Week/30 Days WOUND #2: - Upper Leg Wound Laterality: Left, Medial Cleanser: Normal Saline 3 x Per Week/30 Days Discharge Instructions: Wash your hands with soap and water. Remove old dressing, discard into plastic bag and place into trash. Cleanse the wound  with Normal Saline prior to applying a clean dressing using gauze sponges, not tissues or cotton balls. Do not scrub or use excessive force. Pat dry using gauze sponges, not tissue or cotton balls. Primary Dressing: Hydrofera Blue Ready Transfer Foam, 2.5x2.5 (in/in) 3 x Per Week/30 Days Discharge Instructions: Apply Hydrofera Blue Ready to wound bed as directed Secondary Dressing: ABD Pad 5x9 (in/in) 3 x Per Week/30 Days Discharge Instructions: Cover with ABD pad Secured With: 36M Medipore H Soft Cloth Surgical Tape, 2x2 (in/yd) 3 x Per Week/30 Days WOUND #3: - Upper Leg Wound Laterality: Left, Lateral Cleanser: Dakin 16 (oz) 0.25 1 x Per Day/30 Days Discharge Instructions: Use as directed. Primary Dressing: Gauze 1 x Per Day/30 Days Discharge Instructions: As directed: dry, moistened with saline or moistened with Dakins Solution Secondary Dressing: ABD Pad 5x9 (in/in) 1 x Per Day/30 Days Discharge Instructions: Cover with ABD pad Secured With: 36M Medipore H Soft Cloth Surgical Tape, 2x2 (in/yd) 1 x Per Day/30 Days Secured With: Conforming Stretch Gauze Bandage 4x75 (in/in) 1 x Per Day/30 Days Discharge Instructions: Apply as directed WOUND #4: - Calcaneus Wound Laterality: Right Primary Dressing: Hydrofera Blue Ready Transfer Foam, 2.5x2.5 (in/in) 1 x Per Day/30 Days Discharge Instructions: Apply Hydrofera Blue Ready to wound bed as directed Secondary Dressing: ABD Pad 5x9 (in/in) 1 x Per Day/30 Days Discharge Instructions: Cover with ABD pad Secured With: 36M Medipore H Soft Cloth Surgical Tape, 2x2 (in/yd) 1 x Per Day/30 Days Secured With: Conforming Stretch Gauze Bandage 4x75 (in/in) 1 x Per Day/30 Days Discharge Instructions: Apply as directed WOUND #9: - Calf Wound Laterality: Left, Lateral Cleanser: Dakin 16 (oz) 0.25 1 x Per Day/30 Days Discharge Instructions: Use as directed. Primary Dressing: Gauze 1 x Per Day/30 Days Discharge Instructions: As directed: dry, moistened with  saline or moistened with Dakins Solution Secondary Dressing: ABD Pad 5x9 (in/in) 1 x Per Day/30 Days Discharge Instructions: Cover with ABD pad Secured With: 36M Medipore H Soft Cloth Surgical Tape, 2x2 (in/yd) 1 x Per Day/30 Days Secured With: Conforming Stretch Gauze Bandage 4x75 (in/in) 1 x Per Day/30 Days Discharge Instructions: Apply as directed 1. Would recommend currently that we go ahead and continue  with the wound care measures as before specifically with regard to the [facility] to the majority of the wounds that are nice and clean. 2. We can continue with the Dakin's moistened gauze to the legs where this is helping to clean up the surface of the wounds much more effectively than would otherwise be proceeding. 3. I am also can recommend patient continue with the dressing changes on a regular/daily basis I think this is the best way to go currently. We will see patient back for reevaluation in 2 weeks here in the clinic. If anything worsens or changes patient will contact our office for additional recommendations. Electronic Signature(s) Signed: 06/18/2021 1:05:20 PM By: Worthy Keeler PA-C Entered By: Worthy Keeler on 06/18/2021 13:05:19 Amy Moses ([id number]) -------------------------------------------------------------------------------- SuperBill Details Patient Name: Amy Moses Date of Service: 06/18/2021 Medical Record Number: [id number] Patient Account Number: [id number] Date of Birth/Sex: 05-08-1971 (49 y.o. 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 L89.610 Pressure  ulcer of right heel, unstageable E11.42 Type 2 diabetes mellitus with diabetic polyneuropathy L89.313 Pressure ulcer of right buttock, stage 3 L89.323 Pressure ulcer of left buttock, stage 3 L97.822 Non-pressure chronic ulcer of other part of left lower leg with fat layer exposed L97.812 Non-pressure chronic ulcer of other part of right lower leg with fat layer exposed Facility Procedures CPT4 Code: [id number] Description: 11042 - DEB SUBQ TISSUE 20 SQ CM/< Modifier: Quantity: 1 CPT4 Code: Description: ICD-10 Diagnosis Description L89.610 Pressure ulcer of right heel, unstageable Modifier: Quantity: CPT4 Code: [id number] Description: [id number] - DEB MUSC/FASCIA 20 SQ CM/< Modifier: Quantity: 1 CPT4 Code: Description: ICD-10 Diagnosis Description L97.822 Non-pressure chronic ulcer of other part of left lower leg with fat layer Modifier: exposed Quantity: CPT4 Code: [id number] Description: [id number] - CHEM CAUT GRANULATION TISS Modifier: Quantity: 1 CPT4 Code: Description: ICD-10 Diagnosis Description [id number] 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 Modifier: Quantity: Physician Procedures CPT4 [id number] Description: [id number] - WC PHYS SUBQ TISS 20 SQ CM Modifier: Quantity: 1 CPT4 Code: Description: ICD-10 Diagnosis Description L89.610 Pressure ulcer of right heel, unstageable Modifier: Quantity: CPT4 [id number] Description: [id number] - WC PHYS DEBR MUSCLE/FASCIA 20 SQ CM Modifier: Quantity: 1 CPT4 Code: Description: ICD-10 Diagnosis Description L97.822 Non-pressure chronic ulcer of other part of left lower leg with fat layer e Modifier: xposed Quantity: CPT4 Code: [id number] Description: [id number] - WC PHYS CHEM CAUT GRAN TISSUE Modifier: Quantity: 1 CPT4 Code: Description: ICD-10 Diagnosis Description [id number] 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

## 2021-07-02 ENCOUNTER — Ambulatory Visit: Payer: Medicaid - Out of State | Admitting: Physician Assistant

## 2021-07-08 ENCOUNTER — Other Ambulatory Visit: Payer: Self-pay

## 2021-07-08 ENCOUNTER — Telehealth: Payer: Self-pay | Admitting: Internal Medicine

## 2021-07-08 ENCOUNTER — Encounter: Payer: Medicaid - Out of State | Attending: Physician Assistant | Admitting: Physician Assistant

## 2021-07-08 DIAGNOSIS — L89323 Pressure ulcer of left buttock, stage 3: Secondary | ICD-10-CM | POA: Diagnosis not present

## 2021-07-08 DIAGNOSIS — I120 Hypertensive chronic kidney disease with stage 5 chronic kidney disease or end stage renal disease: Secondary | ICD-10-CM | POA: Insufficient documentation

## 2021-07-08 DIAGNOSIS — L97822 Non-pressure chronic ulcer of other part of left lower leg with fat layer exposed: Secondary | ICD-10-CM | POA: Insufficient documentation

## 2021-07-08 DIAGNOSIS — E1042 Type 1 diabetes mellitus with diabetic polyneuropathy: Secondary | ICD-10-CM | POA: Insufficient documentation

## 2021-07-08 DIAGNOSIS — E1039 Type 1 diabetes mellitus with other diabetic ophthalmic complication: Secondary | ICD-10-CM | POA: Insufficient documentation

## 2021-07-08 DIAGNOSIS — L97812 Non-pressure chronic ulcer of other part of right lower leg with fat layer exposed: Secondary | ICD-10-CM | POA: Insufficient documentation

## 2021-07-08 DIAGNOSIS — L89313 Pressure ulcer of right buttock, stage 3: Secondary | ICD-10-CM | POA: Diagnosis not present

## 2021-07-08 DIAGNOSIS — E1051 Type 1 diabetes mellitus with diabetic peripheral angiopathy without gangrene: Secondary | ICD-10-CM | POA: Insufficient documentation

## 2021-07-08 DIAGNOSIS — L89610 Pressure ulcer of right heel, unstageable: Secondary | ICD-10-CM | POA: Insufficient documentation

## 2021-07-08 DIAGNOSIS — E1022 Type 1 diabetes mellitus with diabetic chronic kidney disease: Secondary | ICD-10-CM | POA: Diagnosis not present

## 2021-07-08 DIAGNOSIS — E10622 Type 1 diabetes mellitus with other skin ulcer: Secondary | ICD-10-CM | POA: Diagnosis present

## 2021-07-08 DIAGNOSIS — Z794 Long term (current) use of insulin: Secondary | ICD-10-CM | POA: Diagnosis not present

## 2021-07-08 DIAGNOSIS — N186 End stage renal disease: Secondary | ICD-10-CM | POA: Insufficient documentation

## 2021-07-08 DIAGNOSIS — H42 Glaucoma in diseases classified elsewhere: Secondary | ICD-10-CM | POA: Diagnosis not present

## 2021-07-08 NOTE — Telephone Encounter (Signed)
Phoned and advised the pt of Dr. Ave Filter recommendations of keeping a watch for now. Advised to call us if it continues call us so we can do blood work. Advised of ER symptoms to watch for. The pt agreed and expressed understanding

## 2021-07-08 NOTE — Telephone Encounter (Signed)
Returned the pt's call and was advised that last night the pt had blood in her stool (not on toilet bowel) and when she wiped. Stated this only happened last night no BM so far today. States there is no abd pain, weakness, fatigue, nausea. States she had a polyp removed at her last colonoscopy and there was a clamp placed where the polyp was. Pt is wondering what you want her to do from here. Please advise

## 2021-07-08 NOTE — Telephone Encounter (Signed)
I think if it was just one episode, it is okay to watch for now.  If this continues then she needs to call back and we will need to perform blood work.  If she starts having a lots of bleeding, associated dizziness, fatigue, chest pain, shortness of breath, then she needs to go to the ER.  Thank you

## 2021-07-08 NOTE — Telephone Encounter (Signed)
Pt said she had a colonoscopy a few months ago and has noticed that she is passing blood. She asked to speak to nurse. Please advise

## 2021-07-08 NOTE — Telephone Encounter (Signed)
Phoned the pt's phone and her vm was full.

## 2021-07-08 NOTE — Progress Notes (Signed)
NISSI, BILL ([id number]) Visit Report for 07/08/2021 Chief Complaint Document Details Patient Name: Amy Moses, Amy Moses Date of Service: 07/08/2021 2:15 PM Medical Record Number: [id number] Patient Account Number: [id number] Date of Birth/Sex: [birth year] (50 y.o. F) Treating RN: Cornell Barman Primary Care Provider: Zenon Mayo Other Clinician: Referring Provider: Zenon Mayo Treating Provider/Extender: Skipper Cliche in Treatment: 31 Information Obtained from: Patient Chief Complaint 12/02/2020; patient is here for review of extensive wounds on her bilateral thighs as well as an area on her right plantar heel Electronic Signature(s) Signed: 07/08/2021 2:31:58 PM By: Worthy Keeler PA-C Entered By: Worthy Keeler on 07/08/2021 14:31:58 Amy Moses ([id number]) -------------------------------------------------------------------------------- HPI Details Patient Name: Amy Moses Date of Service: 07/08/2021 2:15 PM Medical Record Number: [id number] Patient Account Number: [id number] Date of Birth/Sex: 10-12-1971 (49 y.o. F) Treating RN: Cornell Barman Primary Care Provider: Zenon Mayo Other Clinician: Referring Provider: Zenon Mayo Treating Provider/Extender: Skipper Cliche in Treatment: 31 History of Present Illness HPI Description: ADMISSION 12/02/2020 This is a 50 year old woman who is a type II diabetic. Although there are mentions of type 1 diabetes in epic clearly this woman is type II based on the fact that she was on oral agents for 10 years before starting insulin. She also is in chronic renal failure and recently started on dialysis I think in November. She relates her problems starting in September she started to develop skin excoriation on her bilateral inner thighs. She thought this was a friction phenomenon however the tissue wrist can progressively broke down. She was seen by her primary doctor on 10/21/2020 noted to have erythema on both thighs  medially and a blister. MRIs were ordered and she was put on Silvadene and gauze. She was seen in the ER on 12/12 at the [place] [facility] also noted to have a right unstageable heel wound. 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 She dialyzes at [facility] in [facility] and she was started on sodium thiosulfate as far as the notes state. The patient states she gets something at dialysis every day although she has not exactly sure what they are giving her. She was noted by vein and vascular in [place] to have multiple open wounds on 11/19/2020 using Xeroform gauze. She had an angiogram booked by Dr. Oneida Alar predominantly I think because of the right heel ulcer although this was canceled by the patient because of the weather. The patient has large necrotic wounds on both inner thighs with covering black eschar. There is also an area on the left lateral thigh and an eschared area on her right plantar heel. She has been using Betadine to the right heel Xeroform to the areas on her legs. The patient has Medicaid but is able to get the Xeroform, were not really sure how she is managing this although she lives in [place] and there may be different rules for Medicaid in [place] versus [place]. We certainly would not be able to get that here. 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 I wondered whether her neuropathy is particularly affected her sensation of pain since her recent left patella fracture does not seem to have been that painful either Past medical history includes stage V chronic renal failure starting on dialysis I think in November, clearly type 2 diabetes with neuropathy, hypertension, glaucoma, vitamin D deficiency, history of cholecystectomy, edema of both legs, hypothyroidism, she dialyzes Tuesday  Thursday and Saturday at [facility] in  [place] The patient had arterial studies on 11/19/2020 I think at vein and vascular in [place]. She had biphasic waveforms throughout the thigh on the right and at the popliteal proximally and distally in the ATA but the PTA and peroneal were not visualized. On the left again biphasic waveforms up to the level of the distal popliteal but not visualized in the tibial vessels. She was felt to have adequate flow where visualized. As noted she was supposed to have an angiogram which I think is certainly indicated 01/21/3556 upon evaluation today patient actually appears to be doing okay in regard to her wounds. This is actually the first time of seeing her she saw Dr. Dellia Nims at the last visit she does have quite an extensive history based on review. With that being said I do not see any signs right now of active infection which is great news. Overall I am extremely pleased with where things stand in that regard. 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 I really feel like she may do better with Dakin's moistened gauze. I do think that we can see about ordering the Dakin's for her she is already using gauze and then wrapping with roll gauze anyway so really would not change much except for moistening some gauze and applying it to the wound bed. With that being said I do not see any signs of active infection at this time which is great news. The patient all in all seems to be in fairly good spirits all things considered. 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 Fortunately it looks like some of the eschar is loosening up so we should be able to get this removed which hopefully will help in speeding up the healing process. With that being said with regard to the patient's  gluteal region she unfortunately has new pressure ulcerations open today which I think could benefit from possibly having a air mattress. With that being said I explained to the patient that unfortunately this could still take some time as far as getting it to heal completely. There does not appear to be any signs of active infection at this time which is great news. No fevers, chills, nausea, vomiting, or diarrhea. 02/16/2021 upon evaluation today patient appears to be doing about the same in regard to her heel. Her gluteal area is actually getting better. She did have surgery in regard to her thighs bilaterally as well as her knee. It was not until she got into the operating room that it was noted that she had the wounds on the thighs. Subsequently according to the surgeons note dated 02/01/2021 they contacted the patient's daughter in order to discuss the fact they felt she needed to have the wound surgically debrided and a wound VAC placed and subsequently instead of patellar reconstruction they opted to proceed with removal of the Portion of the patella in order to get this area to heal more effectively and quickly. Overall that seems to have done quite well. 02/26/2021 upon evaluation today patient's wounds actually appear to be doing excellent pretty much across the board I am very pleased. With that being said the wounds where she has the wound vacs placed on the thighs in particular seems to be doing a very  good job as far as healing is concerned. There does not appear to be any evidence of infection which is great news and overall I am extremely pleased in that regard. No fevers, chills, nausea, vomiting, or diarrhea. 03/12/21 upon evaluation today patient appears to be doing well with regard to her wounds. I think she is doing excellent with the wound VAC and very pleased in that regard. Fortunately there does not appear to be any signs of infection I am that a try to do a little bit of light  debridement today due to some necrotic regions noted in her wounds to try to help speed up the healing process here. 04/05/2021 upon evaluation today patient appears to be doing decently well in general in regard to her wounds. She was unfortunately in the hospital from the 10th through the 12th of this month due to having high fever and subsequently they did not actually determine exactly what was going on they thought she might of just been sick or had some kind of a cold. With that being said fortunately there does not appear to be any evidence of active infection at this time which is great news. No fevers, chills, nausea, vomiting, or diarrhea. Amy Moses, Amy Moses ([id number]) 05/03/2021 upon evaluation today patient actually appears to be doing excellent for the most part in regard to the wounds on her thighs. I am extremely happy with what I see today. In fact I feel like it is probably time for [name] to discontinue the use of the wound VAC as this appears to be doing so well. The patient voiced an understanding and agreement and is definitely happy to get rid of the wound VAC. She was recently in the hospital but this was secondary to something unrelated to her wound she had a polyp which was bleeding and had to be taken care of this was causing significant issues she has since been taken off of her aspirin. 05/21/2021 upon evaluation today patient appears to be doing well with regard to her wound. She has been tolerating the dressing changes without complication with regard to the [facility] in the upper thigh region. We have been using Dakin's moistened gauze on the legs and the heel. Fortunately everything seems to be making good progress here in general which is great news. The unfortunate thing is that we have been having issues here with some necrotic tissue and drainage on the lateral portions of her legs. Fortunately there does not appear to be any signs of systemic infection although  locally there may be a little bit of infection here based on the drainage which is somewhat blue-green in nature. I am going to perform a culture postdebridement of the right calf/lower extremity region. She tolerated that today without complication. I did perform that culture as well. 06/04/2021 upon evaluation today patient appears to be doing well with regard to the wounds on her thighs in general. Fortunately there does not appear to be any signs of active infection which is great news I am very pleased in that regard. In regard to her lower extremities I do feel like that she is making some good progress here as well. This includes the use of the Dakin's moistened gauze which has done a great job as far as keeping the wounds clean at this point. Overall I am extremely happy with where we stand. 06/18/2021 upon evaluation today patient appears to be doing a little better in regard to most of her wounds in general. She  does have a couple areas again and needs some debridement as far as the left lateral leg and the right heel are concerned. Otherwise she also has a small area of fluid on the right medial thigh where this is mostly healed but nonetheless there is a small opening that is of concern here. Fortunately there is no evidence of active infection at this time. No fevers, chills, nausea, vomiting, or diarrhea. 07/08/2021 upon evaluation today patient appears to be doing quite well in regard to her wounds on the right thigh region and left medial thigh region. That is about the extent of what seems to be doing good though everything else is at least the same if not a little bit worse unfortunately. Currently that includes the right lateral thigh where she has pus draining from this area we are probably need to culture this region. There is also some depth to the area where this is draining from. She also has significant wounds on the calf region right and left of the left especially starting to  migrate and looking like it is getting worse. We been using Dakin's on this area. Her heel seems to be doing about the same there is really no significant improvement in general. Electronic Signature(s) Signed: 07/08/2021 4:43:16 PM By: Worthy Keeler PA-C Entered By: Worthy Keeler on 07/08/2021 16:43:15 Amy Moses ([id number]) -------------------------------------------------------------------------------- Physical Exam Details Patient Name: Amy Moses Date of Service: 07/08/2021 2:15 PM Medical Record Number: [id number] Patient Account Number: [id number] Date of Birth/Sex: 01/10/1971 (49 y.o. F) Treating RN: Cornell Barman Primary Care Provider: Zenon Mayo Other Clinician: Referring Provider: Zenon Mayo Treating Provider/Extender: Skipper Cliche in Treatment: 86 Constitutional Well-nourished and well-hydrated in no acute distress. Respiratory normal breathing without difficulty. Psychiatric this patient is able to make decisions and demonstrates good insight into disease process. Alert and Oriented x 3. pleasant and cooperative. Notes Upon inspection all the patient's wounds are either worse or at least the same at best except for the right thigh and left medial thigh which are both pretty much either healed or almost healed. The lateral thigh on the left unfortunately has pus draining I did obtain a wound culture today from this once I expressed as much as I could I then tried going to get a sample although the opening is very tiny. He does have some depth to it at about 4.5 cm. With regard to the legs in particular I am concerned about the fact there is necrotic tissue noted and on the left leg in particular that seems to still be spreading she still in the sodium thiosulfate and dialysis. Electronic Signature(s) Signed: 07/08/2021 4:44:03 PM By: Worthy Keeler PA-C Entered By: Worthy Keeler on 07/08/2021 16:44:02 Amy Moses  ([id number]) -------------------------------------------------------------------------------- Physician Orders Details Patient Name: Amy Moses Date of Service: 07/08/2021 2:15 PM Medical Record Number: [id number] Patient Account Number: [id number] Date of Birth/Sex: [birth year] (49 y.o. F) Treating RN: Cornell Barman Primary Care Provider: Zenon Mayo Other Clinician: Referring Provider: Zenon Mayo Treating Provider/Extender: Skipper Cliche in Treatment: 69 Verbal / Phone Orders: No Diagnosis Coding ICD-10 Coding Code Description E83.59 Other disorders of calcium metabolism L97.118 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 L97.128 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 L89.610 Pressure ulcer of right heel, unstageable E11.42 Type 2 diabetes mellitus with diabetic polyneuropathy L89.313 Pressure ulcer of right buttock, stage 3 L89.323 Pressure ulcer of left buttock, stage 3 L97.822 Non-pressure chronic ulcer of other  part of left lower leg with fat layer exposed L97.812 Non-pressure chronic ulcer of other part of right lower leg with fat layer exposed Follow-up Appointments o Return Appointment in 2 weeks. [place] Not Available Off-Loading o Gel wheelchair cushion o Air fluidized (Group 3) - stay in the bed except for meals. o Turn and reposition every 2 hours - Recommend PREVALON BOOTS WHEN IN BED FOR OFFLOADING PRESSURE OFF LEG WOUNDS o Other: - keep pressure off of wounded areas. Wound Treatment Wound #1 - Upper Leg Wound Laterality: Right, Medial Cleanser: Normal Saline 3 x Per Week/30 Days Discharge Instructions: Wash your hands with soap and water. Remove old dressing, discard into plastic bag and place into trash. Cleanse the wound with Normal Saline prior to applying a clean dressing using gauze sponges, not tissues or cotton balls.  Do not scrub or use excessive force. Pat dry using gauze sponges, not tissue or cotton balls. Primary Dressing: Hydrofera Blue Ready Transfer Foam, 2.5x2.5 (in/in) 3 x Per Week/30 Days Discharge Instructions: Apply Hydrofera Blue Ready to wound bed as directed Secondary Dressing: ABD Pad 5x9 (in/in) 3 x Per Week/30 Days Discharge Instructions: Cover with ABD pad Secured With: 15M Medipore H Soft Cloth Surgical Tape, 2x2 (in/yd) 3 x Per Week/30 Days Wound #10 - Calf Wound Laterality: Right, Lateral Cleanser: Dakin 16 (oz) 0.25 1 x Per Day/30 Days Discharge Instructions: Use as directed. Primary Dressing: Gauze 1 x Per Day/30 Days Discharge Instructions: As directed: dry, moistened with saline or moistened with Dakins Solution Secondary Dressing: ABD Pad 5x9 (in/in) 1 x Per Day/30 Days Discharge Instructions: Cover with ABD pad Secured With: 15M Medipore H Soft Cloth Surgical Tape, 2x2 (in/yd) 1 x Per Day/30 Days Secured With: Conforming Stretch Gauze Bandage 4x75 (in/in) 1 x Per Day/30 Days Amy Moses, Amy Moses ([id number]) Discharge Instructions: Apply as directed Wound #11 - Lower Leg Wound Laterality: Right, Posterior, Distal Cleanser: Dakin 16 (oz) 0.25 1 x Per Day/30 Days Discharge Instructions: Use as directed. Primary Dressing: Gauze 1 x Per Day/30 Days Discharge Instructions: As directed: dry, moistened with saline or moistened with Dakins Solution Secondary Dressing: ABD Pad 5x9 (in/in) 1 x Per Day/30 Days Discharge Instructions: Cover with ABD pad Secured With: 15M Medipore H Soft Cloth Surgical Tape, 2x2 (in/yd) 1 x Per Day/30 Days Secured With: Conforming Stretch Gauze Bandage 4x75 (in/in) 1 x Per Day/30 Days Discharge Instructions: Apply as directed Wound #13 - Gluteus Wound Laterality: Right Cleanser: Normal Saline 3 x Per Week/30 Days Discharge Instructions: Wash your hands with soap and water. Remove old dressing, discard into plastic bag and place into trash. Cleanse the wound  with Normal Saline prior to applying a clean dressing using gauze sponges, not tissues or cotton balls. Do not scrub or use excessive force. Pat dry using gauze sponges, not tissue or cotton balls. Primary Dressing: Hydrofera Blue Ready Transfer Foam, 2.5x2.5 (in/in) 3 x Per Week/30 Days Discharge Instructions: Apply Hydrofera Blue Ready to wound bed as directed Secondary Dressing: ABD Pad 5x9 (in/in) 3 x Per Week/30 Days Discharge Instructions: Cover with ABD pad Secured With: 15M Medipore H Soft Cloth Surgical Tape, 2x2 (in/yd) 3 x Per Week/30 Days Wound #14 - Gluteus Wound Laterality: Left Cleanser: Normal Saline 3 x Per Week/30 Days Discharge Instructions: Wash your hands with soap and water. Remove old dressing, discard into plastic bag and place into trash. Cleanse the wound with Normal Saline prior to applying a clean dressing using  gauze sponges, not tissues or cotton balls. Do not scrub or use excessive force. Pat dry using gauze sponges, not tissue or cotton balls. Primary Dressing: Hydrofera Blue Ready Transfer Foam, 2.5x2.5 (in/in) 3 x Per Week/30 Days Discharge Instructions: Apply Hydrofera Blue Ready to wound bed as directed Secondary Dressing: ABD Pad 5x9 (in/in) 3 x Per Week/30 Days Discharge Instructions: Cover with ABD pad Secured With: 2M Medipore H Soft Cloth Surgical Tape, 2x2 (in/yd) 3 x Per Week/30 Days Wound #2 - Upper Leg Wound Laterality: Left, Medial Cleanser: Normal Saline 3 x Per Week/30 Days Discharge Instructions: Wash your hands with soap and water. Remove old dressing, discard into plastic bag and place into trash. Cleanse the wound with Normal Saline prior to applying a clean dressing using gauze sponges, not tissues or cotton balls. Do not scrub or use excessive force. Pat dry using gauze sponges, not tissue or cotton balls. Primary Dressing: Hydrofera Blue Ready Transfer Foam, 2.5x2.5 (in/in) 3 x Per Week/30 Days Discharge Instructions: Apply Hydrofera Blue  Ready to wound bed as directed Secondary Dressing: ABD Pad 5x9 (in/in) 3 x Per Week/30 Days Discharge Instructions: Cover with ABD pad Secured With: 2M Medipore H Soft Cloth Surgical Tape, 2x2 (in/yd) 3 x Per Week/30 Days Wound #3 - Upper Leg Wound Laterality: Left, Lateral Cleanser: Dakin 16 (oz) 0.25 1 x Per Day/30 Days Discharge Instructions: Use as directed. Primary Dressing: Gauze 1 x Per Day/30 Days Discharge Instructions: As directed: dry, moistened with saline or moistened with Dakins Solution Amy Moses, Amy Moses ([id number]) Secondary Dressing: ABD Pad 5x9 (in/in) 1 x Per Day/30 Days Discharge Instructions: Cover with ABD pad Secured With: 2M Medipore H Soft Cloth Surgical Tape, 2x2 (in/yd) 1 x Per Day/30 Days Secured With: Conforming Stretch Gauze Bandage 4x75 (in/in) 1 x Per Day/30 Days Discharge Instructions: Apply as directed Wound #4 - Calcaneus Wound Laterality: Right Primary Dressing: Hydrofera Blue Ready Transfer Foam, 2.5x2.5 (in/in) 1 x Per Day/30 Days Discharge Instructions: Apply Hydrofera Blue Ready to wound bed as directed Secondary Dressing: ABD Pad 5x9 (in/in) 1 x Per Day/30 Days Discharge Instructions: Cover with ABD pad Secured With: 2M Medipore H Soft Cloth Surgical Tape, 2x2 (in/yd) 1 x Per Day/30 Days Secured With: Conforming Stretch Gauze Bandage 4x75 (in/in) 1 x Per Day/30 Days Discharge Instructions: Apply as directed Wound #9 - Calf Wound Laterality: Left, Lateral Cleanser: Dakin 16 (oz) 0.25 1 x Per Day/30 Days Discharge Instructions: Use as directed. Primary Dressing: Gauze 1 x Per Day/30 Days Discharge Instructions: As directed: dry, moistened with saline or moistened with Dakins Solution Secondary Dressing: ABD Pad 5x9 (in/in) 1 x Per Day/30 Days Discharge Instructions: Cover with ABD pad Secured With: 2M Medipore H Soft Cloth Surgical Tape, 2x2 (in/yd) 1 x Per Day/30 Days Secured With: Conforming Stretch Gauze Bandage 4x75 (in/in) 1 x Per Day/30  Days Discharge Instructions: Apply as directed Consults o Plastic Surgery - Dr. Claudia Desanctis for evaluation and treatment of bilateral lower legs. Laboratory o Bacteria identified in Wound by Culture (MICRO) - Left lateral leg oooo LOINC Code: [id number] Convenience Name: Wound culture routine Patient Medications Allergies: No Known Allergies Notifications Medication Indication Start End doxycycline hyclate 07/08/2021 DOSE 1 - oral 100 mg capsule - 1 capsule oral taken 2 times per day for 14 days Electronic Signature(s) Signed: 07/08/2021 4:47:05 PM By: Worthy Keeler PA-C Previous Signature: 07/08/2021 3:42:45 PM Version By: Worthy Keeler PA-C Entered By: Worthy Keeler on 07/08/2021 16:47:04 Amy Moses ([id number]) -------------------------------------------------------------------------------- Problem List  Details Patient Name: NORAIDA, KHOSRAVI Date of Service: 07/08/2021 2:15 PM Medical Record Number: [id number] Patient Account Number: [id number] Date of Birth/Sex: 07-22-1971 (50 y.o. F) Treating RN: Cornell Barman Primary Care Provider: Zenon Mayo Other Clinician: Referring Provider: Zenon Mayo Treating Provider/Extender: Skipper Cliche in Treatment: 31 Active Problems ICD-10 Encounter Code Description Active Date MDM Diagnosis E83.59 Other disorders of calcium metabolism 12/02/2020 No Yes L97.118 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 12/02/2020 No Yes L97.128 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 12/02/2020 No Yes L89.610 Pressure ulcer of right heel, unstageable 12/02/2020 No Yes E11.42 Type 2 diabetes mellitus with diabetic polyneuropathy 12/02/2020 No Yes L89.313 Pressure ulcer of right buttock, stage 3 01/19/2021 No Yes L89.323 Pressure ulcer of left buttock, stage 3 01/19/2021 No Yes L97.822 Non-pressure chronic ulcer of other part of left lower leg with fat layer 05/03/2021 No Yes exposed L97.812  Non-pressure chronic ulcer of other part of right lower leg with fat layer 05/03/2021 No Yes exposed Inactive Problems Resolved Problems Electronic Signature(s) Signed: 07/08/2021 2:31:51 PM By: Worthy Keeler PA-C Entered By: Worthy Keeler on 07/08/2021 14:31:50 Amy Moses ([id number]) -------------------------------------------------------------------------------- Progress Note Details Patient Name: Amy Moses Date of Service: 07/08/2021 2:15 PM Medical Record Number: [id number] Patient Account Number: [id number] Date of Birth/Sex: 07-Nov-1971 (49 y.o. F) Treating RN: Cornell Barman Primary Care Provider: Zenon Mayo Other Clinician: Referring Provider: Zenon Mayo Treating Provider/Extender: Skipper Cliche in Treatment: 31 Subjective Chief Complaint Information obtained from Patient 12/02/2020; patient is here for review of extensive wounds on her bilateral thighs as well as an area on her right plantar heel History of Present Illness (HPI) ADMISSION 12/02/2020 This is a 50 year old woman who is a type II diabetic. Although there are mentions of type 1 diabetes in epic clearly this woman is type II based on the fact that she was on oral agents for 10 years before starting insulin. She also is in chronic renal failure and recently started on dialysis I think in November. She relates her problems starting in September she started to develop skin excoriation on her bilateral inner thighs. She thought this was a friction phenomenon however the tissue wrist can progressively broke down. She was seen by her primary doctor on 10/21/2020 noted to have erythema on both thighs medially and a blister. MRIs were ordered and she was put on Silvadene and gauze. She was seen in the ER on 12/12 at the [place] [facility] also noted to have a right unstageable heel wound. 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 She dialyzes at [facility] in  [facility] and she was started on sodium thiosulfate as far as the notes state. The patient states she gets something at dialysis every day although she has not exactly sure what they are giving her. She was noted by vein and vascular in [place] to have multiple open wounds on 11/19/2020 using Xeroform gauze. She had an angiogram booked by Dr. Oneida Alar predominantly I think because of the right heel ulcer although this was canceled by the patient because of the weather. The patient has large necrotic wounds on both inner thighs with covering black eschar. There is also an area on the left lateral thigh and an eschared area on her right plantar heel. She has been using Betadine to the right heel Xeroform to the areas on her legs. The patient has Medicaid but is able to get the Xeroform,  were not really sure how she is managing this although she lives in [place] and there may be different rules for Medicaid in [place] versus [place]. We certainly would not be able to get that here. 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 I wondered whether her neuropathy is particularly affected her sensation of pain since her recent left patella fracture does not seem to have been that painful either Past medical history includes stage V chronic renal failure starting on dialysis I think in November, clearly type 2 diabetes with neuropathy, hypertension, glaucoma, vitamin D deficiency, history of cholecystectomy, edema of both legs, hypothyroidism, she dialyzes Tuesday Thursday and Saturday at [facility] in [place] The patient had arterial studies on 11/19/2020 I think at vein and vascular in [place]. She had biphasic waveforms throughout the thigh on the right and at the popliteal proximally and distally in the ATA but the PTA and peroneal were not visualized. On the left again biphasic waveforms up to  the level of the distal popliteal but not visualized in the tibial vessels. She was felt to have adequate flow where visualized. As noted she was supposed to have an angiogram which I think is certainly indicated [id number] upon evaluation today patient actually appears to be doing okay in regard to her wounds. This is actually the first time of seeing her she saw Dr. Dellia Nims at the last visit she does have quite an extensive history based on review. With that being said I do not see any signs right now of active infection which is great news. Overall I am extremely pleased with where things stand in that regard. 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 I really feel like she may do better with Dakin's moistened gauze. I do think that we can see about ordering the Dakin's for her she is already using gauze and then wrapping with roll gauze anyway so really would not change much except for moistening some gauze and applying it to the wound bed. With that being said I do not see any signs of active infection at this time which is great news. The patient all in all seems to be in fairly good spirits all things considered. 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 Fortunately it looks like some of the eschar is loosening up so we should be able to get this removed which hopefully will help in speeding up the healing process. With that being said with regard to the patient's gluteal region she unfortunately has new pressure ulcerations open today which I think could benefit from possibly having a air mattress. With that being said I explained to the patient that unfortunately this could still take some time as far as getting it to heal completely. There does not appear to be any signs  of active infection at this time which is great news. No fevers, chills, nausea, vomiting, or diarrhea. 02/16/2021 upon evaluation today patient appears to be doing about the same in regard to her heel. Her gluteal area is actually getting better. She did have surgery in regard to her thighs bilaterally as well as her knee. It was not until she got into the operating room that it was noted that she had  the wounds on the thighs. Subsequently according to the surgeons note dated 02/01/2021 they contacted the patient's daughter in order to discuss the fact they felt she needed to have the wound surgically debrided and a wound VAC placed and subsequently instead of patellar reconstruction they opted to proceed with removal of the Portion of the patella in order to get this area to heal more effectively and quickly. Overall that seems to have done quite well. 02/26/2021 upon evaluation today patient's wounds actually appear to be doing excellent pretty much across the board I am very pleased. With that being said the wounds where she has the wound vacs placed on the thighs in particular seems to be doing a very good job as far as healing is concerned. There does not appear to be any evidence of infection which is great news and overall I am extremely pleased in that regard. No fevers, chills, nausea, vomiting, or diarrhea. 03/12/21 upon evaluation today patient appears to be doing well with regard to her wounds. I think she is doing excellent with the wound VAC and very pleased in that regard. Fortunately there does not appear to be any signs of infection I am that a try to do a little bit of light debridement today due to some necrotic regions noted in her wounds to try to help speed up the healing process here. Amy Moses, Amy Moses ([id number]) 04/05/2021 upon evaluation today patient appears to be doing decently well in general in regard to her wounds. She was unfortunately in the hospital from the 10th through  the 12th of this month due to having high fever and subsequently they did not actually determine exactly what was going on they thought she might of just been sick or had some kind of a cold. With that being said fortunately there does not appear to be any evidence of active infection at this time which is great news. No fevers, chills, nausea, vomiting, or diarrhea. 05/03/2021 upon evaluation today patient actually appears to be doing excellent for the most part in regard to the wounds on her thighs. I am extremely happy with what I see today. In fact I feel like it is probably time for [name] to discontinue the use of the wound VAC as this appears to be doing so well. The patient voiced an understanding and agreement and is definitely happy to get rid of the wound VAC. She was recently in the hospital but this was secondary to something unrelated to her wound she had a polyp which was bleeding and had to be taken care of this was causing significant issues she has since been taken off of her aspirin. 05/21/2021 upon evaluation today patient appears to be doing well with regard to her wound. She has been tolerating the dressing changes without complication with regard to the [facility] in the upper thigh region. We have been using Dakin's moistened gauze on the legs and the heel. Fortunately everything seems to be making good progress here in general which is great news. The unfortunate thing is that we have been having issues here with some necrotic tissue and drainage on the lateral portions of her legs. Fortunately there does not appear to be any signs of systemic infection although locally there may be a little bit of infection here based on the drainage which is somewhat blue-green in nature. I am going to perform a culture postdebridement of the right calf/lower extremity region. She tolerated that today without complication. I did perform that culture  as well. 06/04/2021 upon evaluation today  patient appears to be doing well with regard to the wounds on her thighs in general. Fortunately there does not appear to be any signs of active infection which is great news I am very pleased in that regard. In regard to her lower extremities I do feel like that she is making some good progress here as well. This includes the use of the Dakin's moistened gauze which has done a great job as far as keeping the wounds clean at this point. Overall I am extremely happy with where we stand. 06/18/2021 upon evaluation today patient appears to be doing a little better in regard to most of her wounds in general. She does have a couple areas again and needs some debridement as far as the left lateral leg and the right heel are concerned. Otherwise she also has a small area of fluid on the right medial thigh where this is mostly healed but nonetheless there is a small opening that is of concern here. Fortunately there is no evidence of active infection at this time. No fevers, chills, nausea, vomiting, or diarrhea. 07/08/2021 upon evaluation today patient appears to be doing quite well in regard to her wounds on the right thigh region and left medial thigh region. That is about the extent of what seems to be doing good though everything else is at least the same if not a little bit worse unfortunately. Currently that includes the right lateral thigh where she has pus draining from this area we are probably need to culture this region. There is also some depth to the area where this is draining from. She also has significant wounds on the calf region right and left of the left especially starting to migrate and looking like it is getting worse. We been using Dakin's on this area. Her heel seems to be doing about the same there is really no significant improvement in general. Objective Constitutional Well-nourished and well-hydrated in no acute distress. Vitals Time Taken: 2:41 PM, Height: 66 in, Weight: 235 lbs,  BMI: 37.9, Temperature: 98.2 F, Pulse: 84 bpm, Respiratory Rate: 16 breaths/min, Blood Pressure: 146/70 mmHg. Respiratory normal breathing without difficulty. Psychiatric this patient is able to make decisions and demonstrates good insight into disease process. Alert and Oriented x 3. pleasant and cooperative. General Notes: Upon inspection all the patient's wounds are either worse or at least the same at best except for the right thigh and left medial thigh which are both pretty much either healed or almost healed. The lateral thigh on the left unfortunately has pus draining I did obtain a wound culture today from this once I expressed as much as I could I then tried going to get a sample although the opening is very tiny. He does have some depth to it at about 4.5 cm. With regard to the legs in particular I am concerned about the fact there is necrotic tissue noted and on the left leg in particular that seems to still be spreading she still in the sodium thiosulfate and dialysis. Integumentary (Hair, Skin) Wound #1 status is Open. Original cause of wound was Gradually Appeared. The date acquired was: 07/22/2020. The wound has been in treatment 31 weeks. The wound is located on the Right,Medial Upper Leg. The wound measures 0.1cm length x 0.1cm width x 0.1cm depth; 0.008cm^2 area and 0.001cm^3 volume. There is a none present amount of drainage noted. Wound #10 status is Open. Original cause of wound was Gradually Appeared.  The date acquired was: 02/16/2021. The wound has been in treatment 20 weeks. The wound is located on the Right,Lateral Calf. The wound measures 2.2cm length x 4.5cm width x 0.4cm depth; 7.775cm^2 area and 3.11cm^3 volume. There is Fat Layer (Subcutaneous Tissue) exposed. There is a large amount of serosanguineous drainage noted. Foul odor after cleansing was noted. There is medium (34-66%) pink granulation within the wound bed. There is a medium (34-66%) amount of necrotic  tissue within the wound bed including Adherent Slough. ALYNNE, BRADSHAW ([id number]) Wound #11 status is Open. Original cause of wound was Gradually Appeared. The date acquired was: 03/21/2021. The wound has been in treatment 13 weeks. The wound is located on the Right,Distal,Posterior Lower Leg. The wound measures 11.5cm length x 9cm width x 0.7cm depth; 81.289cm^2 area and 56.902cm^3 volume. There is Fat Layer (Subcutaneous Tissue) exposed. There is a large amount of serous drainage noted. There is medium (34-66%) red granulation within the wound bed. There is a medium (34-66%) amount of necrotic tissue within the wound bed including Eschar and Adherent Slough. Wound #13 status is Open. Original cause of wound was Gradually Appeared. The date acquired was: 05/31/2021. The wound has been in treatment 4 weeks. The wound is located on the Right Gluteus. The wound measures 1cm length x 0.05cm width x 0.2cm depth; 0.039cm^2 area and 0.008cm^3 volume. There is Fat Layer (Subcutaneous Tissue) exposed. There is no tunneling or undermining noted. There is a medium amount of serosanguineous drainage noted. There is medium (34-66%) red granulation within the wound bed. There is a medium (34-66%) amount of necrotic tissue within the wound bed including Adherent Slough. Wound #14 status is Open. Original cause of wound was Gradually Appeared. The date acquired was: 05/28/2021. The wound has been in treatment 4 weeks. The wound is located on the Left Gluteus. The wound measures 1.2cm length x 5cm width x 0.2cm depth; 4.712cm^2 area and 0.942cm^3 volume. There is Fat Layer (Subcutaneous Tissue) exposed. There is no tunneling or undermining noted. There is a medium amount of serosanguineous drainage noted. There is medium (34-66%) red granulation within the wound bed. There is a medium (34-66%) amount of necrotic tissue within the wound bed including Adherent Slough. General Notes: multiple smaller wounds within  measured space. Wound #15 status is Open. Original cause of wound was Gradually Appeared. The date acquired was: 06/02/2021. The wound has been in treatment 2 weeks. The wound is located on the Left Gluteal fold. The wound measures 7cm length x 6cm width x 0.2cm depth; 32.987cm^2 area and 6.597cm^3 volume. There is Fat Layer (Subcutaneous Tissue) exposed. There is no tunneling or undermining noted. There is a medium amount of serosanguineous drainage noted. There is medium (34-66%) granulation within the wound bed. There is a medium (34-66%) amount of necrotic tissue within the wound bed including Adherent Slough. General Notes: multiple smaller wounds within measured space. Wound #2 status is Open. Original cause of wound was Gradually Appeared. The date acquired was: 07/22/2020. The wound has been in treatment 31 weeks. The wound is located on the Left,Medial Upper Leg. The wound measures 1.5cm length x 0.7cm width x 0.1cm depth; 0.825cm^2 area and 0.082cm^3 volume. There is Fat Layer (Subcutaneous Tissue) exposed. There is no tunneling or undermining noted. There is a medium amount of serosanguineous drainage noted. There is large (67-100%) granulation within the wound bed. There is a small (1-33%) amount of necrotic tissue within the wound bed including Adherent Slough. Wound #3 status is Open. Original cause of  wound was Gradually Appeared. The date acquired was: 07/22/2020. The wound has been in treatment 31 weeks. The wound is located on the Left,Lateral Upper Leg. The wound measures 2cm length x 3.5cm width x 4.5cm depth; 5.498cm^2 area and 24.74cm^3 volume. There is Fat Layer (Subcutaneous Tissue) exposed. There is a large amount of purulent drainage noted. Foul odor after cleansing was noted. There is large (67-100%) granulation within the wound bed. There is no necrotic tissue within the wound bed. General Notes: three small round granular areas, i has pus pouring out of it with malodor. Wound  #4 status is Open. Original cause of wound was Gradually Appeared. The date acquired was: 10/21/2020. The wound has been in treatment 31 weeks. The wound is located on the Right Calcaneus. The wound measures 5cm length x 4.5cm width x 0.3cm depth; 17.671cm^2 area and 5.301cm^3 volume. There is Fat Layer (Subcutaneous Tissue) exposed. There is a large amount of serosanguineous drainage noted. There is large (67-100%) red granulation within the wound bed. There is a small (1-33%) amount of necrotic tissue within the wound bed including Adherent Slough. Wound #9 status is Open. Original cause of wound was Gradually Appeared. The date acquired was: 02/16/2021. The wound has been in treatment 20 weeks. The wound is located on the Left,Lateral Calf. The wound measures 17cm length x 12.8cm width x 2.2cm depth; 170.903cm^2 area and 375.986cm^3 volume. There is tendon, Fat Layer (Subcutaneous Tissue), and fascia exposed. There is a large amount of serosanguineous drainage noted. Foul odor after cleansing was noted. There is small (1-33%) pink granulation within the wound bed. There is a large (67-100%) amount of necrotic tissue within the wound bed including Eschar and Adherent Slough. 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 Home Health: Other Home Health Orders/Instructions: - Home Health Not  Available Off-Loading: Gel wheelchair cushion Air fluidized (Group 3) - stay in the bed except for meals. Turn and reposition every 2 hours - Recommend PREVALON BOOTS WHEN IN BED FOR OFFLOADING PRESSURE OFF LEG WOUNDS Other: - keep pressure off of wounded areas. Laboratory ordered were: Wound culture routine - Left lateral leg Consults ordered were: Plastic Surgery - Dr. Claudia Desanctis for evaluation and treatment of bilateral lower legs. The following medication(s) was prescribed: doxycycline hyclate oral 100 mg capsule 1 1 capsule oral taken 2 times per day for 14 days starting 07/08/2021 WOUND #1: - Upper Leg Wound Laterality: Right, Medial Cleanser: Normal Saline 3 x Per Week/30 Days Discharge Instructions: Wash your hands with soap and water. Remove old dressing, discard into plastic bag and place into trash. Cleanse the wound with Normal Saline prior to applying a clean dressing using gauze sponges, not tissues or cotton balls. Do not scrub or use excessive force. Pat dry using gauze sponges, not tissue or cotton balls. Primary Dressing: Hydrofera Blue Ready Transfer Foam, 2.5x2.5 (in/in) 3 x Per Week/30 Days Discharge Instructions: Apply Hydrofera Blue Ready to wound bed as directed Secondary Dressing: ABD Pad 5x9 (in/in) 3 x  Per Week/30 Days Discharge Instructions: Cover with ABD pad Secured With: 42M Medipore H Soft Cloth Surgical Tape, 2x2 (in/yd) 3 x Per Week/30 Days WOUND #10: - Calf Wound Laterality: Right, Lateral Cleanser: Dakin 16 (oz) 0.25 1 x Per Day/30 Days Discharge Instructions: Use as directed. Primary Dressing: Gauze 1 x Per Day/30 Days Discharge Instructions: As directed: dry, moistened with saline or moistened with Dakins Solution Secondary Dressing: ABD Pad 5x9 (in/in) 1 x Per Day/30 Days Discharge Instructions: Cover with ABD pad Secured With: 42M Medipore H Soft Cloth Surgical Tape, 2x2 (in/yd) 1 x Per Day/30 Days Secured With: Conforming Stretch Gauze Bandage 4x75  (in/in) 1 x Per Day/30 Days Discharge Instructions: Apply as directed WOUND #11: - Lower Leg Wound Laterality: Right, Posterior, Distal Cleanser: Dakin 16 (oz) 0.25 1 x Per Day/30 Days Discharge Instructions: Use as directed. Primary Dressing: Gauze 1 x Per Day/30 Days Discharge Instructions: As directed: dry, moistened with saline or moistened with Dakins Solution Secondary Dressing: ABD Pad 5x9 (in/in) 1 x Per Day/30 Days Discharge Instructions: Cover with ABD pad Secured With: 42M Medipore H Soft Cloth Surgical Tape, 2x2 (in/yd) 1 x Per Day/30 Days Secured With: Conforming Stretch Gauze Bandage 4x75 (in/in) 1 x Per Day/30 Days Discharge Instructions: Apply as directed WOUND #13: - Gluteus Wound Laterality: Right Cleanser: Normal Saline 3 x Per Week/30 Days Discharge Instructions: Wash your hands with soap and water. Remove old dressing, discard into plastic bag and place into trash. Cleanse the wound with Normal Saline prior to applying a clean dressing using gauze sponges, not tissues or cotton balls. Do not scrub or use excessive force. Pat dry using gauze sponges, not tissue or cotton balls. Primary Dressing: Hydrofera Blue Ready Transfer Foam, 2.5x2.5 (in/in) 3 x Per Week/30 Days Discharge Instructions: Apply Hydrofera Blue Ready to wound bed as directed Secondary Dressing: ABD Pad 5x9 (in/in) 3 x Per Week/30 Days Discharge Instructions: Cover with ABD pad Secured With: 42M Medipore H Soft Cloth Surgical Tape, 2x2 (in/yd) 3 x Per Week/30 Days WOUND #14: - Gluteus Wound Laterality: Left Cleanser: Normal Saline 3 x Per Week/30 Days Discharge Instructions: Wash your hands with soap and water. Remove old dressing, discard into plastic bag and place into trash. Cleanse the wound with Normal Saline prior to applying a clean dressing using gauze sponges, not tissues or cotton balls. Do not scrub or use excessive force. Pat dry using gauze sponges, not tissue or cotton balls. Primary  Dressing: Hydrofera Blue Ready Transfer Foam, 2.5x2.5 (in/in) 3 x Per Week/30 Days Discharge Instructions: Apply Hydrofera Blue Ready to wound bed as directed Secondary Dressing: ABD Pad 5x9 (in/in) 3 x Per Week/30 Days Discharge Instructions: Cover with ABD pad Secured With: 42M Medipore H Soft Cloth Surgical Tape, 2x2 (in/yd) 3 x Per Week/30 Days WOUND #2: - Upper Leg Wound Laterality: Left, Medial Cleanser: Normal Saline 3 x Per Week/30 Days Discharge Instructions: Wash your hands with soap and water. Remove old dressing, discard into plastic bag and place into trash. Cleanse the wound with Normal Saline prior to applying a clean dressing using gauze sponges, not tissues or cotton balls. Do not scrub or use excessive force. Pat dry using gauze sponges, not tissue or cotton balls. Primary Dressing: Hydrofera Blue Ready Transfer Foam, 2.5x2.5 (in/in) 3 x Per Week/30 Days Discharge Instructions: Apply Hydrofera Blue Ready to wound bed as directed Secondary Dressing: ABD Pad 5x9 (in/in) 3 x Per Week/30 Days Discharge Instructions: Cover with ABD pad Secured With: 42M Medipore H  Soft Cloth Surgical Tape, 2x2 (in/yd) 3 x Per Week/30 Days WOUND #3: - Upper Leg Wound Laterality: Left, Lateral Cleanser: Dakin 16 (oz) 0.25 1 x Per Day/30 Days Discharge Instructions: Use as directed. Primary Dressing: Gauze 1 x Per Day/30 Days Discharge Instructions: As directed: dry, moistened with saline or moistened with Dakins Solution Amy Moses, Amy Moses ([id number]) Secondary Dressing: ABD Pad 5x9 (in/in) 1 x Per Day/30 Days Discharge Instructions: Cover with ABD pad Secured With: 44M Medipore H Soft Cloth Surgical Tape, 2x2 (in/yd) 1 x Per Day/30 Days Secured With: Conforming Stretch Gauze Bandage 4x75 (in/in) 1 x Per Day/30 Days Discharge Instructions: Apply as directed WOUND #4: - Calcaneus Wound Laterality: Right Primary Dressing: Hydrofera Blue Ready Transfer Foam, 2.5x2.5 (in/in) 1 x Per Day/30  Days Discharge Instructions: Apply Hydrofera Blue Ready to wound bed as directed Secondary Dressing: ABD Pad 5x9 (in/in) 1 x Per Day/30 Days Discharge Instructions: Cover with ABD pad Secured With: 44M Medipore H Soft Cloth Surgical Tape, 2x2 (in/yd) 1 x Per Day/30 Days Secured With: Conforming Stretch Gauze Bandage 4x75 (in/in) 1 x Per Day/30 Days Discharge Instructions: Apply as directed WOUND #9: - Calf Wound Laterality: Left, Lateral Cleanser: Dakin 16 (oz) 0.25 1 x Per Day/30 Days Discharge Instructions: Use as directed. Primary Dressing: Gauze 1 x Per Day/30 Days Discharge Instructions: As directed: dry, moistened with saline or moistened with Dakins Solution Secondary Dressing: ABD Pad 5x9 (in/in) 1 x Per Day/30 Days Discharge Instructions: Cover with ABD pad Secured With: 44M Medipore H Soft Cloth Surgical Tape, 2x2 (in/yd) 1 x Per Day/30 Days Secured With: Conforming Stretch Gauze Bandage 4x75 (in/in) 1 x Per Day/30 Days Discharge Instructions: Apply as directed 1. Would recommend currently that we go and see about making a referral to Dr. Claudia Desanctis in regard to the legs especially to see if there is anything he can offer from this treatment standpoint at this time. 2. I am also going to recommend that we go ahead and have the patient continue with the [facility] Blue to the open wound locations on the heel as well as the gluteal regions. That I think is good although honestly I think the biggest thing here in regard to the gluteal area as she needs to offload more. 3. Were still using Dakin's in regard to the calf regions bilaterally. These are substantial wounds and on the left especially seems to be enlarging the right as well does not seem to be doing as well as I would like to see. She inquired about wound vacs here but again there is no chance of that until they can get the wounds completely clean for that reason I want to see what Dr. Claudia Desanctis thinks about this area in particular. 4. I  did go ahead and send in a prescription for her for doxycycline which I think will do much better in general as far as a broad-spectrum antibiotic is concerned she is taking this before without complication. Subsequently after the doxycycline is initiated I think that the best option is probably can be to wait on the culture and based on the culture results we will make any adjustments as we need to in therapy. We will see patient back for reevaluation in 1 week here in the clinic. If anything worsens or changes patient will contact our office for additional recommendations. Electronic Signature(s) Signed: 07/08/2021 4:47:20 PM By: Worthy Keeler PA-C Entered By: Worthy Keeler on 07/08/2021 16:47:19 Amy Moses ([id number]) -------------------------------------------------------------------------------- SuperBill Details Patient Name: Amy Moses  Date of Service: 07/08/2021 Medical Record Number: [id number] Patient Account Number: [id number] Date of Birth/Sex: [birth year] (50 y.o. 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 L89.610 Pressure ulcer of right heel, unstageable E11.42 Type 2 diabetes mellitus with diabetic polyneuropathy L89.313 Pressure ulcer of right buttock, stage 3 L89.323 Pressure ulcer of left buttock, stage 3 L97.822 Non-pressure chronic ulcer of other part of left lower leg with fat layer exposed L97.812 Non-pressure chronic ulcer of other part of right lower leg with fat layer exposed Physician Procedures CPT4 Code: [id number] Description: 99214 - WC PHYS LEVEL 4 - EST  PT Modifier: Quantity: 1 CPT4 Code: Description: ICD-10 Diagnosis Description 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

## 2021-07-08 NOTE — Progress Notes (Addendum)
BILLIJO, DILLING ([id number]) Visit Report for 07/08/2021 Arrival Information Details Patient Name: Amy Moses Date of Service: 07/08/2021 2:15 PM Medical Record Number: [id number] Patient Account Number: [id number] Date of Birth/Sex: 1971/06/01 (50 y.o. F) Treating RN: Cornell Barman Primary Care Sharese Manrique: Zenon Mayo Other Clinician: Referring Eugenie Harewood: Zenon Mayo Treating [place]/Extender: Skipper Cliche in Treatment: 16 Visit Information History Since Last Visit Pain Present Now: No Patient Arrived: Stretcher Arrival Time: 14:40 Accompanied By: EMS Transfer Assistance: Stretcher Patient Identification Verified: Yes Secondary Verification Process Completed: Yes Patient Requires Transmission-Based No Precautions: Patient Has Alerts: Yes Patient Alerts: Patient on Blood Thinner ASPIRIN Electronic Signature(s) Signed: 07/08/2021 4:51:15 PM By: Gretta Cool, BSN, RN, CWS, Kim RN, BSN Entered By: Gretta Cool, BSN, RN, CWS, Kim on 07/08/2021 14:41:08 Amy Moses ([id number]) -------------------------------------------------------------------------------- Clinic Level of Care Assessment Details Patient Name: Amy Moses Date of Service: 07/08/2021 2:15 PM Medical Record Number: [id number] Patient Account Number: [id number] Date of Birth/Sex: 1971/08/20 (49 y.o. F) Treating RN: Cornell Barman Primary Care Ranessa Kosta: Zenon Mayo Other Clinician: Referring Dantae Meunier: Zenon Mayo Treating Jalasia Eskridge/Extender: Skipper Cliche in Treatment: 31 Clinic Level of Care Assessment Items TOOL 4 Quantity Score []  - Use when only an EandM is performed on FOLLOW-UP visit 0 ASSESSMENTS - Nursing Assessment / Reassessment X - Reassessment of Co-morbidities (includes updates in patient status) 1 10 X- 1 5 Reassessment of Adherence to Treatment Plan ASSESSMENTS - Wound and Skin Assessment / Reassessment []  - Simple Wound Assessment / Reassessment - one wound 0 X- 9 5 Complex Wound  Assessment / Reassessment - multiple wounds []  - 0 Dermatologic / Skin Assessment (not related to wound area) ASSESSMENTS - Focused Assessment []  - Circumferential Edema Measurements - multi extremities 0 []  - 0 Nutritional Assessment / Counseling / Intervention X- 1 5 Lower Extremity Assessment (monofilament, tuning fork, pulses) []  - 0 Peripheral Arterial Disease Assessment (using hand held doppler) ASSESSMENTS - Ostomy and/or Continence Assessment and Care []  - Incontinence Assessment and Management 0 []  - 0 Ostomy Care Assessment and Management (repouching, etc.) PROCESS - Coordination of Care []  - Simple Patient / Family Education for ongoing care 0 X- 1 20 Complex (extensive) Patient / Family Education for ongoing care X- 1 10 Staff obtains Consents, Records, Test Results / Process Orders []  - 0 Staff telephones HHA, Nursing Homes / Clarify orders / etc []  - 0 Routine Transfer to another Facility (non-emergent condition) []  - 0 Routine Hospital Admission (non-emergent condition) []  - 0 New Admissions / Biomedical engineer / Ordering NPWT, Apligraf, etc. []  - 0 Emergency Hospital Admission (emergent condition) X- 1 10 Simple Discharge Coordination []  - 0 Complex (extensive) Discharge Coordination PROCESS - Special Needs []  - Pediatric / Minor Patient Management 0 []  - 0 Isolation Patient Management []  - 0 Hearing / Language / Visual special needs []  - 0 Assessment of Community assistance (transportation, D/C planning, etc.) []  - 0 Additional assistance / Altered mentation []  - 0 Support Surface(s) Assessment (bed, cushion, seat, etc.) INTERVENTIONS - Wound Cleansing / Measurement Moses, Amy ([id number]) []  - 0 Simple Wound Cleansing - one wound X- 9 5 Complex Wound Cleansing - multiple wounds X- 1 5 Wound Imaging (photographs - any number of wounds) []  - 0 Wound Tracing (instead of photographs) []  - 0 Simple Wound Measurement - one wound X- 9  5 Complex Wound Measurement - multiple wounds INTERVENTIONS - Wound Dressings []  - Small Wound Dressing one or multiple wounds 0 X- 7 15 Medium Wound Dressing one or multiple wounds X-  2 20 Large Wound Dressing one or multiple wounds []  - 0 Application of Medications - topical []  - 0 Application of Medications - injection INTERVENTIONS - Miscellaneous []  - External ear exam 0 []  - 0 Specimen Collection (cultures, biopsies, blood, body fluids, etc.) []  - 0 Specimen(s) / Culture(s) sent or taken to Lab for analysis X- 1 10 Patient Transfer (multiple staff / Civil Service fast streamer / Similar devices) []  - 0 Simple Staple / Suture removal (25 or less) []  - 0 Complex Staple / Suture removal (26 or more) []  - 0 Hypo / Hyperglycemic Management (close monitor of Blood Glucose) []  - 0 Ankle / Brachial Index (ABI) - do not check if billed separately X- 1 5 Vital Signs Has the patient been seen at the hospital within the last three years: Yes Total Score: 360 Level Of Care: New/Established - Level 5 Electronic Signature(s) Signed: 07/08/2021 4:51:15 PM By: Gretta Cool, BSN, RN, CWS, Kim RN, BSN Entered By: Gretta Cool, BSN, RN, CWS, Kim on 07/08/2021 16:05:19 Amy Moses ([id number]) -------------------------------------------------------------------------------- Encounter Discharge Information Details Patient Name: Amy Moses Date of Service: 07/08/2021 2:15 PM Medical Record Number: [id number] Patient Account Number: [id number] Date of Birth/Sex: 1971/05/27 (49 y.o. F) Treating RN: Cornell Barman Primary Care Diamonds Lippard: Zenon Mayo Other Clinician: Referring Yazmyn Valbuena: Zenon Mayo Treating Pelham Hennick/Extender: Skipper Cliche in Treatment: 31 Encounter Discharge Information Items Discharge Condition: Stable Ambulatory Status: Stretcher Discharge Destination: Home Transportation: Ambulance Accompanied By: EMS Schedule Follow-up Appointment: Yes Clinical Summary of Care: Electronic  Signature(s) Signed: 07/08/2021 4:50:55 PM By: Gretta Cool, BSN, RN, CWS, Kim RN, BSN Entered By: Gretta Cool, BSN, RN, CWS, Kim on 07/08/2021 16:50:55 Amy Moses ([id number]) -------------------------------------------------------------------------------- Lower Extremity Assessment Details Patient Name: Amy Moses Date of Service: 07/08/2021 2:15 PM Medical Record Number: [id number] Patient Account Number: [id number] Date of Birth/Sex: 04/07/1971 (49 y.o. F) Treating RN: Cornell Barman Primary Care Emilina Smarr: Zenon Mayo Other Clinician: Referring Sayge Salvato: Zenon Mayo Treating Barbarann Kelly/Extender: Skipper Cliche in Treatment: 31 Edema Assessment Assessed: [Left: Yes] [Right: Yes] Edema: [Left: Yes] [Right: Yes] Calf Left: Right: Point of Measurement: From Medial Instep 49 cm 47 cm Ankle Left: Right: Point of Measurement: From Medial Instep 28 cm 26 cm Vascular Assessment Pulses: Dorsalis Pedis Palpable: [Left:Yes] [Right:Yes] Electronic Signature(s) Signed: 07/08/2021 4:51:15 PM By: Gretta Cool, BSN, RN, CWS, Kim RN, BSN Entered By: Gretta Cool, BSN, RN, CWS, Kim on 07/08/2021 15:28:38 Amy Moses ([id number]) -------------------------------------------------------------------------------- Multi Wound Chart Details Patient Name: Amy Moses Date of Service: 07/08/2021 2:15 PM Medical Record Number: [id number] Patient Account Number: [id number] Date of Birth/Sex: 08/28/1971 (49 y.o. F) Treating RN: Cornell Barman Primary Care Ann Bohne: Zenon Mayo Other Clinician: Referring Ronit Marczak: Zenon Mayo Treating Zavia Pullen/Extender: Skipper Cliche in Treatment: 31 Vital Signs Height(in): 40 Pulse(bpm): 19 Weight(lbs): 235 Blood Pressure(mmHg): 146/70 Body Mass Index(BMI): 38 Temperature(F): 98.2 Respiratory Rate(breaths/min): 16 Photos: [1:No Photos] [10:No Photos] [11:No Photos] Wound Location: [1:Right, Medial Upper Leg] [10:Right, Lateral Calf] [11:Right, Distal,  Posterior Lower Leg] Wounding Event: [1:Gradually Appeared] [10:Gradually Appeared] [11:Gradually Appeared] Primary Etiology: [1:Calciphylaxis] [10:Calciphylaxis] [11:Calciphylaxis] Comorbid History: [1:N/A] [10:Cataracts, Glaucoma, Anemia, Hypertension, Type II Diabetes, End Hypertension, Type II Diabetes, End Stage Renal Disease, Neuropathy] [11:Cataracts, Glaucoma, Anemia, Stage Renal Disease, Neuropathy] Date Acquired: [1:07/22/2020] [10:02/16/2021] [11:03/21/2021] Weeks of Treatment: [1:31] [10:20] [11:13] Wound Status: [1:Open] [10:Open] [11:Open] Clustered Wound: [1:No] [10:No] [11:No] Clustered Quantity: [1:N/A] [10:N/A] [11:N/A] Measurements L x W x D (cm) [1:0.1x0.1x0.1] [10:2.2x4.5x0.4] [11:11.5x9x0.7] Area (cm) : [1:0.008] [10:7.775] [11:81.289] Volume (cm) : [1:0.001] [10:3.11] [11:56.902] % Reduction in Area: [1:100.00%] [10:-260.00%] [11:-1970.00%] %  Reduction in Volume: [1:100.00%] [10:-1339.80%] [11:-14378.90%] Classification: [1:Full Thickness Without Exposed Support Structures] [10:Full Thickness Without Exposed Support Structures] [11:Full Thickness Without Exposed Support Structures] Exudate Amount: [1:None Present] [10:Large] [11:Large] Exudate Type: [1:N/A] [10:Serosanguineous] [11:Serous] Exudate Color: [1:N/A] [10:red, brown] [11:amber] Foul Odor After Cleansing: [1:N/A] [10:Yes] [11:No] Odor Anticipated Due to Product N/A [10:No] [11:N/A] Use: Granulation Amount: [1:N/A] [10:Medium (34-66%)] [11:Medium (34-66%)] Granulation Quality: [1:N/A] [10:Pink] [11:Red] Necrotic Amount: [1:N/A] [10:Medium (34-66%)] [11:Medium (34-66%)] Necrotic Tissue: [1:N/A] [10:Adherent Slough] [11:Eschar, Adherent Slough] Epithelialization: [1:N/A N/A] [10:None N/A] [11:None N/A] Wound Number: 13 [id number] 15  Photos: No Photos No Photos No Photos Wound Location: Right Gluteus Left Gluteus Left Gluteal fold Wounding Event: Gradually Appeared Gradually Appeared Gradually Appeared Primary  Etiology: Pressure Ulcer Pressure Ulcer Pressure Ulcer Comorbid History: Cataracts, Glaucoma, Anemia, Cataracts, Glaucoma, Anemia, Cataracts, Glaucoma, Anemia, Hypertension, Type II Diabetes, End Hypertension, Type II Diabetes, End Hypertension, Type II Diabetes, End Stage Renal Disease, Neuropathy Stage Renal Disease, Neuropathy Stage Renal Disease, Neuropathy Date Acquired: 05/31/2021 05/28/2021 06/02/2021 Weeks of Treatment: 4 4 2  Wound Status: Open Open Open Clustered Wound: No No Yes Clustered Quantity: N/A N/A N/A Measurements L x W x D (cm) 1x0.05x0.2 1.2x5x0.2 7x6x0.2 Area (cm) : 0.039 4.712 32.987 Volume (cm) : 0.008 0.942 6.597 % Reduction in Area: 98.10% -73.90% -460.10% % Reduction in Volume: 96.10% -247.60% -1020.00% Classification: Category/Stage II Category/Stage II Category/Stage III Exudate Amount: Medium Medium Medium Amy Moses, Amy Moses ([id number]) Exudate Type: Serosanguineous Serosanguineous Serosanguineous Exudate Color: red, brown red, brown red, brown Foul Odor After Cleansing: No No No Odor Anticipated Due to Product N/A N/A N/A Use: Granulation Amount: Medium (34-66%) Medium (34-66%) Medium (34-66%) Granulation Quality: Red Red N/A Necrotic Amount: Medium (34-66%) Medium (34-66%) Medium (34-66%) Necrotic Tissue: Adherent [place] Exposed Structures: Fat Layer (Subcutaneous Tissue): Fat Layer (Subcutaneous Tissue): Fat Layer (Subcutaneous Tissue): Yes Yes Yes Fascia: No Fascia: No Tendon: No Tendon: No Muscle: No Muscle: No Joint: No Joint: No Bone: No Bone: No Epithelialization: Small (1-33%) Medium (34-66%) Small (1-33%) Assessment Notes: N/A multiple smaller wounds within multiple smaller wounds within measured space. measured space. Wound Number: 2 3 4  Photos: No Photos No Photos No Photos Wound Location: Left, Medial Upper Leg Left, Lateral Upper Leg Right Calcaneus Wounding Event: Gradually Appeared Gradually  Appeared Gradually Appeared Primary Etiology: Calciphylaxis Calciphylaxis Pressure Ulcer Comorbid History: Cataracts, Glaucoma, Anemia, Cataracts, Glaucoma, Anemia, Cataracts, Glaucoma, Anemia, Hypertension, Type II Diabetes, End Hypertension, Type II Diabetes, End Hypertension, Type II Diabetes, End Stage Renal Disease, Neuropathy Stage Renal Disease, Neuropathy Stage Renal Disease, Neuropathy Date Acquired: 07/22/2020 07/22/2020 10/21/2020 Weeks of Treatment: 31 31 31  Wound Status: Open Open Open Clustered Wound: No Yes No Clustered Quantity: N/A 3 N/A Measurements L x W x D (cm) 1.5x0.7x0.1 2x3.5x4.5 5x4.5x0.3 Area (cm) : 0.825 5.498 17.671 Volume (cm) : 0.082 24.74 5.301 % Reduction in Area: 99.30% 78.10% 37.50% % Reduction in Volume: 99.30% -884.50% -87.50% Classification: Full Thickness Without Exposed Full Thickness Without Exposed Unstageable/Unclassified Support Structures Support Structures Exudate Amount: Medium Large Large Exudate Type: Serosanguineous Purulent Serosanguineous Exudate Color: red, brown yellow, brown, green red, brown Foul Odor After Cleansing: No Yes No Odor Anticipated Due to Product N/A No N/A Use: Granulation Amount: Large (67-100%) Large (67-100%) Large (67-100%) Granulation Quality: N/A N/A Red Necrotic Amount: Small (1-33%) None Present (0%) Small (1-33%) Necrotic Tissue: Adherent [place] Exposed Structures: Fat Layer (Subcutaneous Tissue): Fat Layer (Subcutaneous Tissue): Fat Layer (Subcutaneous Tissue): Yes Yes Yes Fascia: No Fascia: No  Fascia: No Tendon: No Tendon: No Tendon: No Muscle: No Muscle: No Muscle: No Joint: No Joint: No Joint: No Bone: No Bone: No Bone: No Epithelialization: Small (1-33%) Large (67-100%) Small (1-33%) Assessment Notes: N/A three small round granular areas, i N/A has pus pouring out of it with malodor. Wound Number: 9 N/A N/A Photos: No Photos N/A N/A Wound Location: Left, Lateral Calf  N/A N/A Wounding Event: Gradually Appeared N/A N/A Primary Etiology: Calciphylaxis N/A N/A Comorbid History: Cataracts, Glaucoma, Anemia, N/A N/A Hypertension, Type II Diabetes, End Stage Renal Disease, Neuropathy Date Acquired: 02/16/2021 N/A N/A Weeks of Treatment: 20 N/A N/A Wound Status: Open N/A N/A Clustered Wound: No N/A N/A Amy Moses, Amy Moses ([id number]) Clustered Quantity: N/A N/A N/A Measurements L x W x D (cm) 17x12.8x2.2 N/A N/A Area (cm) : 170.903 N/A N/A Volume (cm) : 375.986 N/A N/A % Reduction in Area: -2372.60% N/A N/A % Reduction in Volume: -54311.90% N/A N/A Classification: Full Thickness With Exposed N/A N/A Support Structures Exudate Amount: Large N/A N/A Exudate Type: Serosanguineous N/A N/A Exudate Color: red, brown N/A N/A Foul Odor After Cleansing: Yes N/A N/A Odor Anticipated Due to Product No N/A N/A Use: Granulation Amount: Small (1-33%) N/A N/A Granulation Quality: Pink N/A N/A Necrotic Amount: Large (67-100%) N/A N/A Necrotic Tissue: Eschar, Adherent Slough N/A N/A Exposed Structures: Fascia: Yes N/A N/A Fat Layer (Subcutaneous Tissue): Yes Tendon: Yes Muscle: No Joint: No Bone: No Epithelialization: None N/A N/A Assessment Notes: N/A N/A N/A Treatment Notes Electronic Signature(s) Signed: 07/08/2021 4:51:15 PM By: Gretta Cool, BSN, RN, CWS, Kim RN, BSN Entered By: Gretta Cool, BSN, RN, CWS, Kim on 07/08/2021 15:35:58 Amy Moses ([id number]) -------------------------------------------------------------------------------- Multi-Disciplinary Care Plan Details Patient Name: Amy Moses Date of Service: 07/08/2021 2:15 PM Medical Record Number: [id number] Patient Account Number: [id number] Date of Birth/Sex: [birth year] (49 y.o. F) Treating RN: Cornell Barman Primary Care Zury Fazzino: Zenon Mayo Other Clinician: Referring Flemon Kelty: Zenon Mayo Treating Theodis Kinsel/Extender: Skipper Cliche in Treatment: 31 Active Inactive Necrotic  Tissue Nursing Diagnoses: Impaired tissue integrity related to necrotic/devitalized tissue Goals: Necrotic/devitalized tissue will be minimized in the wound bed Date Initiated: 12/02/2020 Target Resolution Date: 07/05/2021 Goal Status: Active Patient/caregiver will verbalize understanding of reason and process for debridement of necrotic tissue Date Initiated: 12/02/2020 Date Inactivated: 01/15/2021 Target Resolution Date: 01/02/2021 Goal Status: Met Interventions: Assess patient pain level pre-, during and post procedure and prior to discharge Provide education on necrotic tissue and debridement process Notes: Soft Tissue Infection Nursing Diagnoses: Impaired tissue integrity Potential for infection: soft tissue Goals: Patient will remain free of wound infection Date Initiated: 07/08/2021 Target Resolution Date: 07/08/2021 Goal Status: Active Patient/caregiver will verbalize understanding of or measures to prevent infection and contamination in the home setting Date Initiated: 07/08/2021 Target Resolution Date: 07/08/2021 Goal Status: Active Patient's soft tissue infection will resolve Date Initiated: 07/08/2021 Target Resolution Date: 07/15/2021 Goal Status: Active Signs and symptoms of infection will be recognized early to allow for prompt treatment Date Initiated: 07/08/2021 Target Resolution Date: 07/08/2021 Goal Status: Active Interventions: Assess signs and symptoms of infection every visit Treatment Activities: Culture and sensitivity : 07/08/2021 Notes: Electronic Signature(s) Signed: 07/08/2021 4:51:15 PM By: Gretta Cool, BSN, RN, CWS, Kim RN, BSN Entered By: Gretta Cool, BSN, RN, CWS, Kim on 07/08/2021 15:35:49 Amy Moses ([id number]) -------------------------------------------------------------------------------- Pain Assessment Details Patient Name: Amy Moses Date of Service: 07/08/2021 2:15 PM Medical Record Number: [id number] Patient Account Number: [id number] Date  of Birth/Sex: February 05, 1971 (49 y.o. F) Treating RN: Cornell Barman Primary Care Heike Pounds: Zenon Mayo Other  Clinician: Referring Lj Miyamoto: Zenon Mayo Treating Lady Wisham/Extender: Skipper Cliche in Treatment: 31 Active Problems Location of Pain Severity and Description of Pain Patient Has Paino No Site Locations Pain Management and Medication Current Pain Management: Notes Patient denies pain at this time. Electronic Signature(s) Signed: 07/08/2021 4:51:15 PM By: Gretta Cool, BSN, RN, CWS, Kim RN, BSN Entered By: Gretta Cool, BSN, RN, CWS, Kim on 07/08/2021 14:42:25 Amy Moses ([id number]) -------------------------------------------------------------------------------- Wound Assessment Details Patient Name: Amy Moses Date of Service: 07/08/2021 2:15 PM Medical Record Number: [id number] Patient Account Number: [id number] Date of Birth/Sex: 1970-12-07 (49 y.o. F) Treating RN: Cornell Barman Primary Care Keene Gilkey: Zenon Mayo Other Clinician: Referring Reta Norgren: Zenon Mayo Treating Laelah Siravo/Extender: Skipper Cliche in Treatment: 31 Wound Status Wound Number: 1 Primary Etiology: Calciphylaxis Wound Location: Right, Medial Upper Leg Wound Status: Open Wounding Event: Gradually Appeared Date Acquired: 07/22/2020 Weeks Of Treatment: 31 Clustered Wound: No Wound Measurements Length: (cm) 0.1 Width: (cm) 0.1 Depth: (cm) 0.1 Area: (cm) 0.008 Volume: (cm) 0.001 % Reduction in Area: 100% % Reduction in Volume: 100% Wound Description Classification: Full Thickness Without Exposed Support Structure Exudate Amount: None Present s Treatment Notes Wound #1 (Upper Leg) Wound Laterality: Right, Medial Cleanser Normal Saline Discharge Instruction: Wash your hands with soap and water. Remove old dressing, discard into plastic bag and place into trash. Cleanse the wound with Normal Saline prior to applying a clean dressing using gauze sponges, not tissues or cotton balls. Do  not scrub or use excessive force. Pat dry using gauze sponges, not tissue or cotton balls. Peri-Wound Care Topical Primary Dressing Hydrofera Blue Ready Transfer Foam, 2.5x2.5 (in/in) Discharge Instruction: Apply Hydrofera Blue Ready to wound bed as directed Secondary Dressing ABD Pad 5x9 (in/in) Discharge Instruction: Cover with ABD pad Secured With 68M P[employer]tt Surgical Tape, 2x2 (in/yd) Compression Wrap Compression Stockings Add-Ons Electronic Signature(s) Signed: 07/08/2021 4:51:15 PM By: Gretta Cool, BSN, RN, CWS, Kim RN, BSN Entered By: Gretta Cool, BSN, RN, CWS, Kim on 07/08/2021 15:11:30 Amy Moses ([id number]) -------------------------------------------------------------------------------- Wound Assessment Details Patient Name: Amy Moses Date of Service: 07/08/2021 2:15 PM Medical Record Number: [id number] Patient Account Number: [id number] Date of Birth/Sex: 1971/09/07 (49 y.o. F) Treating RN: Cornell Barman Primary Care Mylie Mccurley: Zenon Mayo Other Clinician: Referring Marica Trentham: Zenon Mayo Treating Anyi Fels/Extender: Skipper Cliche in Treatment: 31 Wound Status Wound Number: 10 Primary Calciphylaxis Etiology: Wound Location: Right, Lateral Calf Wound Open Wounding Event: Gradually Appeared Status: Date Acquired: 02/16/2021 Comorbid Cataracts, Glaucoma, Anemia, Hypertension, Type II Weeks Of Treatment: 20 History: Diabetes, End Stage Renal Disease, Neuropathy Clustered Wound: No Wound Measurements Length: (cm) 2.2 Width: (cm) 4.5 Depth: (cm) 0.4 Area: (cm) 7.775 Volume: (cm) 3.11 % Reduction in Area: -260% % Reduction in Volume: -1339.8% Epithelialization: None Wound Description Classification: Full Thickness Without Exposed Support Structu Exudate Amount: Large Exudate Type: Serosanguineous Exudate Color: red, brown res Foul Odor After Cleansing: Yes Due to Product Use: No Slough/Fibrino No Wound Bed Granulation Amount: Medium  (34-66%) Exposed Structure Granulation Quality: Pink Fascia Exposed: No Necrotic Amount: Medium (34-66%) Fat Layer (Subcutaneous Tissue) Exposed: Yes Necrotic Quality: Adherent Slough Tendon Exposed: No Muscle Exposed: No Joint Exposed: No Bone Exposed: No Treatment Notes Wound #10 (Calf) Wound Laterality: Right, Lateral Cleanser Dakin 16 (oz) 0.25 Discharge Instruction: Use as directed. Peri-Wound Care Topical Primary Dressing Gauze Discharge Instruction: As directed: dry, moistened with saline or moistened with Dakins Solution Secondary Dressing ABD Pad 5x9 (in/in) Discharge Instruction: Cover with ABD pad Secured With 68M Medipore H Soft Cloth Surgical Tape,  2x2 (in/yd) Conforming Stretch Gauze Bandage 4x75 (in/in) Discharge Instruction: Apply as directed Amy Moses, Amy Moses ([id number]) Compression Wrap Compression Stockings Add-Ons Electronic Signature(s) Signed: 07/08/2021 4:51:15 PM By: Gretta Cool, BSN, RN, CWS, Kim RN, BSN Entered By: Gretta Cool, BSN, RN, CWS, Kim on 07/08/2021 15:16:19 Amy Moses ([id number]) -------------------------------------------------------------------------------- Wound Assessment Details Patient Name: Amy Moses Date of Service: 07/08/2021 2:15 PM Medical Record Number: [id number] Patient Account Number: [id number] Date of Birth/Sex: 03/07/1971 (49 y.o. F) Treating RN: Cornell Barman Primary Care Kiyaan Haq: Zenon Mayo Other Clinician: Referring Nader Boys: Zenon Mayo Treating Sylar Voong/Extender: Skipper Cliche in Treatment: 31 Wound Status Wound Number: 11 Primary Calciphylaxis Etiology: Wound Location: Right, Distal, Posterior Lower Leg Wound Open Wounding Event: Gradually Appeared Status: Date Acquired: 03/21/2021 Comorbid Cataracts, Glaucoma, Anemia, Hypertension, Type II Weeks Of Treatment: 13 History: Diabetes, End Stage Renal Disease, Neuropathy Clustered Wound: No Wound Measurements Length: (cm) 11.5 Width: (cm) 9 Depth:  (cm) 0.7 Area: (cm) 81.289 Volume: (cm) 56.902 % Reduction in Area: -1970% % Reduction in Volume: -14378.9% Epithelialization: None Wound Description Classification: Full Thickness Without Exposed Support Structu Exudate Amount: Large Exudate Type: Serous Exudate Color: amber res Foul Odor After Cleansing: No Slough/Fibrino Yes Wound Bed Granulation Amount: Medium (34-66%) Exposed Structure Granulation Quality: Red Fascia Exposed: No Necrotic Amount: Medium (34-66%) Fat Layer (Subcutaneous Tissue) Exposed: Yes Necrotic Quality: Eschar, Adherent Slough Tendon Exposed: No Muscle Exposed: No Joint Exposed: No Bone Exposed: No Treatment Notes Wound #11 (Lower Leg) Wound Laterality: Right, Posterior, Distal Cleanser Dakin 16 (oz) 0.25 Discharge Instruction: Use as directed. Peri-Wound Care Topical Primary Dressing Gauze Discharge Instruction: As directed: dry, moistened with saline or moistened with Dakins Solution Secondary Dressing ABD Pad 5x9 (in/in) Discharge Instruction: Cover with ABD pad Secured With 51M Medipore H Soft Cloth Surgical Tape, 2x2 (in/yd) Conforming Stretch Gauze Bandage 4x75 (in/in) Discharge Instruction: Apply as directed Amy Moses, Amy Moses ([id number]) Compression Wrap Compression Stockings Add-Ons Electronic Signature(s) Signed: 07/08/2021 4:51:15 PM By: Gretta Cool, BSN, RN, CWS, Kim RN, BSN Entered By: Gretta Cool, BSN, RN, CWS, Kim on 07/08/2021 15:17:00 Amy Moses ([id number]) -------------------------------------------------------------------------------- Wound Assessment Details Patient Name: Amy Moses Date of Service: 07/08/2021 2:15 PM Medical Record Number: [id number] Patient Account Number: [id number] Date of Birth/Sex: 1971/08/24 (49 y.o. F) Treating RN: Cornell Barman Primary Care Mauriana Dann: Zenon Mayo Other Clinician: Referring Alasia Enge: Zenon Mayo Treating Jamiya Nims/Extender: Skipper Cliche in Treatment: 31 Wound  Status Wound Number: 13 Primary Pressure Ulcer Etiology: Wound Location: Right Gluteus Wound Open Wounding Event: Gradually Appeared Status: Date Acquired: 05/31/2021 Comorbid Cataracts, Glaucoma, Anemia, Hypertension, Type II Weeks Of Treatment: 4 History: Diabetes, End Stage Renal Disease, Neuropathy Clustered Wound: No Wound Measurements Length: (cm) 1 Width: (cm) 0.05 Depth: (cm) 0.2 Area: (cm) 0.039 Volume: (cm) 0.008 % Reduction in Area: 98.1% % Reduction in Volume: 96.1% Epithelialization: Small (1-33%) Tunneling: No Undermining: No Wound Description Classification: Category/Stage II Exudate Amount: Medium Exudate Type: Serosanguineous Exudate Color: red, brown Foul Odor After Cleansing: No Slough/Fibrino Yes Wound Bed Granulation Amount: Medium (34-66%) Exposed Structure Granulation Quality: Red Fascia Exposed: No Necrotic Amount: Medium (34-66%) Fat Layer (Subcutaneous Tissue) Exposed: Yes Necrotic Quality: Adherent Slough Tendon Exposed: No Muscle Exposed: No Joint Exposed: No Bone Exposed: No Treatment Notes Wound #13 (Gluteus) Wound Laterality: Right Cleanser Normal Saline Discharge Instruction: Wash your hands with soap and water. Remove old dressing, discard into plastic bag and place into trash. Cleanse the wound with Normal Saline prior to applying a clean dressing using gauze sponges, not tissues or cotton balls. Do not scrub or  use excessive force. Pat dry using gauze sponges, not tissue or cotton balls. Peri-Wound Care Topical Primary Dressing Hydrofera Blue Ready Transfer Foam, 2.5x2.5 (in/in) Discharge Instruction: Apply Hydrofera Blue Ready to wound bed as directed Secondary Dressing ABD Pad 5x9 (in/in) Discharge Instruction: Cover with ABD pad Secured With 637M [place] Surgical Tape, 2x2 (in/yd) Compression Wrap Amy Moses, Amy Moses ([id number]) Compression Stockings Add-Ons Electronic Signature(s) Signed: 07/08/2021 4:51:15  PM By: Gretta Cool, BSN, RN, CWS, Kim RN, BSN Entered By: Gretta Cool, BSN, RN, CWS, Kim on 07/08/2021 15:17:37 Amy Moses ([id number]) -------------------------------------------------------------------------------- Wound Assessment Details Patient Name: Amy Moses Date of Service: 07/08/2021 2:15 PM Medical Record Number: [id number] Patient Account Number: [id number] Date of Birth/Sex: Jul 10, 1971 (49 y.o. F) Treating RN: Cornell Barman Primary Care Haydn Cush: Zenon Mayo Other Clinician: Referring Brisa Auth: Zenon Mayo Treating Ordell Prichett/Extender: Skipper Cliche in Treatment: 31 Wound Status Wound Number: 14 Primary Pressure Ulcer Etiology: Wound Location: Left Gluteus Wound Open Wounding Event: Gradually Appeared Status: Date Acquired: 05/28/2021 Comorbid Cataracts, Glaucoma, Anemia, Hypertension, Type II Weeks Of Treatment: 4 History: Diabetes, End Stage Renal Disease, Neuropathy Clustered Wound: No Wound Measurements Length: (cm) 1.2 Width: (cm) 5 Depth: (cm) 0.2 Area: (cm) 4.712 Volume: (cm) 0.942 % Reduction in Area: -73.9% % Reduction in Volume: -247.6% Epithelialization: Medium (34-66%) Tunneling: No Undermining: No Wound Description Classification: Category/Stage II Exudate Amount: Medium Exudate Type: Serosanguineous Exudate Color: red, brown Foul Odor After Cleansing: No Slough/Fibrino Yes Wound Bed Granulation Amount: Medium (34-66%) Exposed Structure Granulation Quality: Red Fascia Exposed: No Necrotic Amount: Medium (34-66%) Fat Layer (Subcutaneous Tissue) Exposed: Yes Necrotic Quality: Adherent Slough Tendon Exposed: No Muscle Exposed: No Joint Exposed: No Bone Exposed: No Assessment Notes multiple smaller wounds within measured space. Treatment Notes Wound #14 (Gluteus) Wound Laterality: Left Cleanser Normal Saline Discharge Instruction: Wash your hands with soap and water. Remove old dressing, discard into plastic bag and place into  trash. Cleanse the wound with Normal Saline prior to applying a clean dressing using gauze sponges, not tissues or cotton balls. Do not scrub or use excessive force. Pat dry using gauze sponges, not tissue or cotton balls. Peri-Wound Care Topical Primary Dressing Hydrofera Blue Ready Transfer Foam, 2.5x2.5 (in/in) Discharge Instruction: Apply Hydrofera Blue Ready to wound bed as directed Secondary Dressing ABD Pad 5x9 (in/in) Discharge Instruction: Cover with ABD pad Amy Moses, Amy Moses ([id number]) Secured With 637M [place] Surgical Tape, 2x2 (in/yd) Compression Wrap Compression Stockings Environmental education officer) Signed: 07/08/2021 4:51:15 PM By: Gretta Cool, BSN, RN, CWS, Kim RN, BSN Entered By: Gretta Cool, BSN, RN, CWS, Kim on 07/08/2021 15:19:15 Amy Moses ([id number]) -------------------------------------------------------------------------------- Wound Assessment Details Patient Name: Amy Moses Date of Service: 07/08/2021 2:15 PM Medical Record Number: [id number] Patient Account Number: [id number] Date of Birth/Sex: Apr 28, 1971 (49 y.o. F) Treating RN: Cornell Barman Primary Care Durenda Pechacek: Zenon Mayo Other Clinician: Referring Mekai Wilkinson: Zenon Mayo Treating Lynea Rollison/Extender: Skipper Cliche in Treatment: 31 Wound Status Wound Number: 15 Primary Pressure Ulcer Etiology: Wound Location: Left Gluteal fold Wound Open Wounding Event: Gradually Appeared Status: Date Acquired: 06/02/2021 Comorbid Cataracts, Glaucoma, Anemia, Hypertension, Type II Weeks Of Treatment: 2 History: Diabetes, End Stage Renal Disease, Neuropathy Clustered Wound: Yes Wound Measurements Length: (cm) 7 Width: (cm) 6 Depth: (cm) 0.2 Area: (cm) 32.987 Volume: (cm) 6.597 % Reduction in Area: -460.1% % Reduction in Volume: -1020% Epithelialization: Small (1-33%) Tunneling: No Undermining: No Wound Description Classification: Category/Stage III Exudate Amount:  Medium Exudate Type: Serosanguineous Exudate Color: red, brown Foul Odor After Cleansing: No Slough/Fibrino Yes Wound  Bed Granulation Amount: Medium (34-66%) Exposed Structure Necrotic Amount: Medium (34-66%) Fat Layer (Subcutaneous Tissue) Exposed: Yes Necrotic Quality: Adherent Slough Assessment Notes multiple smaller wounds within measured space. Treatment Notes Wound #15 (Gluteal fold) Wound Laterality: Left Cleanser Peri-Wound Care Topical Primary Dressing Secondary Dressing Secured With Compression Wrap Compression Stockings Add-Ons Electronic Signature(s) Signed: 07/08/2021 4:51:15 PM By: Gretta Cool, BSN, RN, CWS, Kim RN, BSN Entered By: Gretta Cool, BSN, RN, CWS, Kim on 07/08/2021 15:20:21 Amy Moses ([id number]) -------------------------------------------------------------------------------- Wound Assessment Details Patient Name: Amy Moses Date of Service: 07/08/2021 2:15 PM Medical Record Number: [id number] Patient Account Number: [id number] Date of Birth/Sex: 1971-07-07 (49 y.o. F) Treating RN: Cornell Barman Primary Care Samanvi Cuccia: Zenon Mayo Other Clinician: Referring Dalin Caldera: Zenon Mayo Treating Sonda Coppens/Extender: Skipper Cliche in Treatment: 31 Wound Status Wound Number: 2 Primary Calciphylaxis Etiology: Wound Location: Left, Medial Upper Leg Wound Open Wounding Event: Gradually Appeared Status: Date Acquired: 07/22/2020 Comorbid Cataracts, Glaucoma, Anemia, Hypertension, Type II Weeks Of Treatment: 31 History: Diabetes, End Stage Renal Disease, Neuropathy Clustered Wound: No Wound Measurements Length: (cm) 1.5 Width: (cm) 0.7 Depth: (cm) 0.1 Area: (cm) 0.825 Volume: (cm) 0.082 % Reduction in Area: 99.3% % Reduction in Volume: 99.3% Epithelialization: Small (1-33%) Tunneling: No Undermining: No Wound Description Classification: Full Thickness Without Exposed Support Structures Exudate Amount: Medium Exudate Type:  Serosanguineous Exudate Color: red, brown Foul Odor After Cleansing: No Slough/Fibrino Yes Wound Bed Granulation Amount: Large (67-100%) Exposed Structure Necrotic Amount: Small (1-33%) Fascia Exposed: No Necrotic Quality: Adherent Slough Fat Layer (Subcutaneous Tissue) Exposed: Yes Tendon Exposed: No Muscle Exposed: No Joint Exposed: No Bone Exposed: No Treatment Notes Wound #2 (Upper Leg) Wound Laterality: Left, Medial Cleanser Normal Saline Discharge Instruction: Wash your hands with soap and water. Remove old dressing, discard into plastic bag and place into trash. Cleanse the wound with Normal Saline prior to applying a clean dressing using gauze sponges, not tissues or cotton balls. Do not scrub or use excessive force. Pat dry using gauze sponges, not tissue or cotton balls. Peri-Wound Care Topical Primary Dressing Hydrofera Blue Ready Transfer Foam, 2.5x2.5 (in/in) Discharge Instruction: Apply Hydrofera Blue Ready to wound bed as directed Secondary Dressing ABD Pad 5x9 (in/in) Discharge Instruction: Cover with ABD pad Secured With 71M [place] Surgical Tape, 2x2 (in/yd) Compression Wrap Amy Moses, Amy Moses ([id number]) Compression Stockings Add-Ons Electronic Signature(s) Signed: 07/08/2021 4:51:15 PM By: Gretta Cool, BSN, RN, CWS, Kim RN, BSN Entered By: Gretta Cool, BSN, RN, CWS, Kim on 07/08/2021 15:12:18 Amy Moses ([id number]) -------------------------------------------------------------------------------- Wound Assessment Details Patient Name: Amy Moses Date of Service: 07/08/2021 2:15 PM Medical Record Number: [id number] Patient Account Number: [id number] Date of Birth/Sex: 07-06-1971 (49 y.o. F) Treating RN: Cornell Barman Primary Care Jimy Gates: Zenon Mayo Other Clinician: Referring Zebulon Gantt: Zenon Mayo Treating Joette Schmoker/Extender: Skipper Cliche in Treatment: 31 Wound Status Wound Number: 3 Primary Calciphylaxis Etiology: Wound Location:  Left, Lateral Upper Leg Wound Open Wounding Event: Gradually Appeared Status: Date Acquired: 07/22/2020 Comorbid Cataracts, Glaucoma, Anemia, Hypertension, Type II Weeks Of Treatment: 31 History: Diabetes, End Stage Renal Disease, Neuropathy Clustered Wound: Yes Wound Measurements Length: (cm) 2 Width: (cm) 3.5 Depth: (cm) 4.5 Clustered Quantity: 3 Area: (cm) 5.498 Volume: (cm) 24.74 % Reduction in Area: 78.1% % Reduction in Volume: -884.5% Epithelialization: Large (67-100%) Wound Description Classification: Full Thickness Without Exposed Support Structures Exudate Amount: Large Exudate Type: Purulent Exudate Color: yellow, brown, green Foul Odor After Cleansing: Yes Due to Product Use: No Slough/Fibrino No Wound Bed Granulation Amount: Large (67-100%) Exposed Structure Necrotic Amount: None  Present (0%) Fascia Exposed: No Fat Layer (Subcutaneous Tissue) Exposed: Yes Tendon Exposed: No Muscle Exposed: No Joint Exposed: No Bone Exposed: No Assessment Notes three small round granular areas, i has pus pouring out of it with malodor. Treatment Notes Wound #3 (Upper Leg) Wound Laterality: Left, Lateral Cleanser Dakin 16 (oz) 0.25 Discharge Instruction: Use as directed. Peri-Wound Care Topical Primary Dressing Gauze Discharge Instruction: As directed: dry, moistened with saline or moistened with Dakins Solution Secondary Dressing ABD Pad 5x9 (in/in) Discharge Instruction: Cover with ABD pad Secured With Amy Moses, Amy Moses ([id number]) 27M Medipore H Soft Cloth Surgical Tape, 2x2 (in/yd) Conforming Stretch Gauze Bandage 4x75 (in/in) Discharge Instruction: Apply as directed Compression Wrap Compression Stockings Add-Ons Electronic Signature(s) Signed: 07/08/2021 4:51:15 PM By: Gretta Cool, BSN, RN, CWS, Kim RN, BSN Entered By: Gretta Cool, BSN, RN, CWS, Kim on 07/08/2021 15:33:46 Amy Moses  ([id number]) -------------------------------------------------------------------------------- Wound Assessment Details Patient Name: Amy Moses Date of Service: 07/08/2021 2:15 PM Medical Record Number: [id number] Patient Account Number: [id number] Date of Birth/Sex: 1971/06/29 (49 y.o. F) Treating RN: Cornell Barman Primary Care Ayo Smoak: Zenon Mayo Other Clinician: Referring Marina Boerner: Zenon Mayo Treating Anyjah Roundtree/Extender: Skipper Cliche in Treatment: 31 Wound Status Wound Number: 4 Primary Pressure Ulcer Etiology: Wound Location: Right Calcaneus Wound Open Wounding Event: Gradually Appeared Status: Date Acquired: 10/21/2020 Comorbid Cataracts, Glaucoma, Anemia, Hypertension, Type II Weeks Of Treatment: 31 History: Diabetes, End Stage Renal Disease, Neuropathy Clustered Wound: No Wound Measurements Length: (cm) 5 Width: (cm) 4.5 Depth: (cm) 0.3 Area: (cm) 17.671 Volume: (cm) 5.301 % Reduction in Area: 37.5% % Reduction in Volume: -87.5% Epithelialization: Small (1-33%) Wound Description Classification: Unstageable/Unclassified Exudate Amount: Large Exudate Type: Serosanguineous Exudate Color: red, brown Foul Odor After Cleansing: No Slough/Fibrino Yes Wound Bed Granulation Amount: Large (67-100%) Exposed Structure Granulation Quality: Red Fascia Exposed: No Necrotic Amount: Small (1-33%) Fat Layer (Subcutaneous Tissue) Exposed: Yes Necrotic Quality: Adherent Slough Tendon Exposed: No Muscle Exposed: No Joint Exposed: No Bone Exposed: No Treatment Notes Wound #4 (Calcaneus) Wound Laterality: Right Cleanser Peri-Wound Care Topical Primary Dressing Hydrofera Blue Ready Transfer Foam, 2.5x2.5 (in/in) Discharge Instruction: Apply Hydrofera Blue Ready to wound bed as directed Secondary Dressing ABD Pad 5x9 (in/in) Discharge Instruction: Cover with ABD pad Secured With 27M Medipore H Soft Cloth Surgical Tape, 2x2 (in/yd) Conforming Stretch Gauze  Bandage 4x75 (in/in) Discharge Instruction: Apply as directed Compression Wrap Compression Stockings Amy Moses, Amy Moses ([id number]) Add-Ons Electronic Signature(s) Signed: 07/08/2021 4:51:15 PM By: Gretta Cool, BSN, RN, CWS, Kim RN, BSN Entered By: Gretta Cool, BSN, RN, CWS, Kim on 07/08/2021 15:14:53 Amy Moses ([id number]) -------------------------------------------------------------------------------- Wound Assessment Details Patient Name: Amy Moses Date of Service: 07/08/2021 2:15 PM Medical Record Number: [id number] Patient Account Number: [id number] Date of Birth/Sex: 06/16/1971 (49 y.o. F) Treating RN: Cornell Barman Primary Care Kallum Jorgensen: Zenon Mayo Other Clinician: Referring Cabrini Ruggieri: Zenon Mayo Treating Bernon Arviso/Extender: Skipper Cliche in Treatment: 31 Wound Status Wound Number: 9 Primary Calciphylaxis Etiology: Wound Location: Left, Lateral Calf Wound Open Wounding Event: Gradually Appeared Status: Date Acquired: 02/16/2021 Comorbid Cataracts, Glaucoma, Anemia, Hypertension, Type II Weeks Of Treatment: 20 History: Diabetes, End Stage Renal Disease, Neuropathy Clustered Wound: No Wound Measurements Length: (cm) 17 Width: (cm) 12.8 Depth: (cm) 2.2 Area: (cm) 170.903 Volume: (cm) 375.986 % Reduction in Area: -2372.6% % Reduction in Volume: -54311.9% Epithelialization: None Wound Description Classification: Full Thickness With Exposed Support Structures Exudate Amount: Large Exudate Type: Serosanguineous Exudate Color: red, brown Foul Odor After Cleansing: Yes Due to Product Use: No Slough/Fibrino Yes Wound Bed Granulation Amount: Small (1-33%) Exposed  Structure Granulation Quality: Pink Fascia Exposed: Yes Necrotic Amount: Large (67-100%) Fat Layer (Subcutaneous Tissue) Exposed: Yes Necrotic Quality: Eschar, Adherent Slough Tendon Exposed: Yes Muscle Exposed: No Joint Exposed: No Bone Exposed: No Treatment Notes Wound #9 (Calf) Wound  Laterality: Left, Lateral Cleanser Dakin 16 (oz) 0.25 Discharge Instruction: Use as directed. Peri-Wound Care Topical Primary Dressing Gauze Discharge Instruction: As directed: dry, moistened with saline or moistened with Dakins Solution Secondary Dressing ABD Pad 5x9 (in/in) Discharge Instruction: Cover with ABD pad Secured With 85M Medipore H Soft Cloth Surgical Tape, 2x2 (in/yd) Conforming Stretch Gauze Bandage 4x75 (in/in) Discharge Instruction: Apply as directed Amy Moses, BENJAMIN ([id number]) Compression Wrap Compression Stockings Add-Ons Electronic Signature(s) Signed: 07/08/2021 4:51:15 PM By: Gretta Cool, BSN, RN, CWS, Kim RN, BSN Entered By: Gretta Cool, BSN, RN, CWS, Kim on 07/08/2021 15:15:36 Amy Moses ([id number]) -------------------------------------------------------------------------------- Vitals Details Patient Name: Amy Moses Date of Service: 07/08/2021 2:15 PM Medical Record Number: [id number] Patient Account Number: [id number] Date of Birth/Sex: 03/31/1971 (49 y.o. F) Treating RN: Cornell Barman Primary Care Pasco Marchitto: Zenon Mayo Other Clinician: Referring Netha Dafoe: Zenon Mayo Treating Salima Rumer/Extender: Skipper Cliche in Treatment: 31 Vital Signs Time Taken: 14:41 Temperature (F): 98.2 Height (in): 66 Pulse (bpm): 84 Weight (lbs): 235 Respiratory Rate (breaths/min): 16 Body Mass Index (BMI): 37.9 Blood Pressure (mmHg): 146/70 Reference Range: 80 - 120 mg / dl Electronic Signature(s) Signed: 07/08/2021 4:51:15 PM By: Gretta Cool, BSN, RN, CWS, Kim RN, BSN Entered By: Gretta Cool, BSN, RN, CWS, Kim on 07/08/2021 14:41:34

## 2021-07-09 ENCOUNTER — Other Ambulatory Visit
Admission: RE | Admit: 2021-07-09 | Discharge: 2021-07-09 | Disposition: A | Discharge status: Home / Self Care | Payer: Medicaid - Out of State | Source: Ambulatory Visit | Attending: Physician Assistant | Admitting: Physician Assistant

## 2021-07-09 DIAGNOSIS — L089 Local infection of the skin and subcutaneous tissue, unspecified: Secondary | ICD-10-CM | POA: Diagnosis present

## 2021-07-11 LAB — AEROBIC CULTURE W GRAM STAIN (SUPERFICIAL SPECIMEN)

## 2021-07-22 ENCOUNTER — Encounter: Payer: Self-pay | Admitting: Plastic Surgery

## 2021-07-22 ENCOUNTER — Ambulatory Visit (INDEPENDENT_AMBULATORY_CARE_PROVIDER_SITE_OTHER): Payer: Medicaid - Out of State | Admitting: Plastic Surgery

## 2021-07-22 ENCOUNTER — Other Ambulatory Visit: Payer: Self-pay

## 2021-07-22 NOTE — Progress Notes (Signed)
Referring Provider Leeanne Rio, MD [place],  [place] [address]   CC:  Chief Complaint  Patient presents with   consult      Amy Moses is an 50 y.o. female.  HPI: Patient presents to discuss a chronic wound on the posterior aspect of her left calf.  Its been present for at least several months but it is hard to say exactly for sure.   She has been treated at the wound care center and has been getting Dakin's dressing changes.  It sounds like the wound came on spontaneously.  She did develop a number of other wounds on her lower extremities that have been diagnosed as calciphylaxis.  She is end-stage renal on dialysis with uncontrolled diabetes with a hemoglobin A1c of at least 7.3 according to the labs I have.  She is wheelchair-bound and has not been able to bear weight on her left leg for quite some time.  She does state that she has had a previous patellar injury that limits that.  Allergies  Allergen Reactions   Ace Inhibitors Cough    Outpatient Encounter Medications as of 07/22/2021  Medication Sig   acetaminophen (TYLENOL) 500 MG tablet Take 1,000 mg by mouth every 6 (six) hours as needed for moderate pain.   aspirin EC 81 MG tablet Take 1 tablet (81 mg total) by mouth daily with breakfast.   atorvastatin (LIPITOR) 10 MG tablet Take 1 tablet (10 mg total) by mouth daily.   camphor-menthol (SARNA) lotion Apply 1 application topically as needed for itching.   cholestyramine light (PREVALITE) 4 g packet Take 1 packet (4 g total) by mouth 2 (two) times daily.   HUMALOG KWIKPEN 100 UNIT/ML KwikPen Inject 2 Units into the skin 3 (three) times daily with meals. If eats 50% or more of meal.   insulin glargine (LANTUS) 100 UNIT/ML injection Inject 0.05 mLs (5 Units total) into the skin at bedtime.   levothyroxine (SYNTHROID) 50 MCG tablet Take 1 tablet (50 mcg total) by mouth daily before breakfast.   metoprolol succinate (TOPROL-XL) 50 MG 24 hr tablet Take 1  tablet (50 mg total) by mouth at bedtime. Take with or immediately following a meal.   pantoprazole (PROTONIX) 40 MG tablet Take 1 tablet (40 mg total) by mouth 2 (two) times daily.   polyethylene glycol (MIRALAX) 17 g packet Take 17 g by mouth daily as needed for mild constipation.   sevelamer carbonate (RENVELA) 800 MG tablet Take 1 tablet (800 mg total) by mouth 3 (three) times daily with meals.   torsemide (DEMADEX) 100 MG tablet Take 150 mg by mouth daily.    Vitamin D, Ergocalciferol, (DRISDOL) 1.25 MG (50000 UNIT) CAPS capsule Take 1 capsule by mouth every 14 (fourteen) days.   No facility-administered encounter medications on file as of 07/22/2021.     Past Medical History:  Diagnosis Date   Anemia    Blindness of right eye with low vision in contralateral eye    s/p victrectomy   Diabetes mellitus, type II ([place])    Dyslipidemia    Glaucoma    Hypertension    Hypothyroidism (acquired)    Kidney disease    Stage 5   Pneumonia     Past Surgical History:  Procedure Laterality Date   ABDOMINAL AORTOGRAM W/LOWER EXTREMITY Bilateral 12/18/2020   Procedure: ABDOMINAL AORTOGRAM W/LOWER EXTREMITY;  Surgeon: Elam Dutch, MD;  Location: [facility];  Service: Cardiovascular;  Laterality: Bilateral;  ANKLE FRACTURE SURGERY     AV FISTULA PLACEMENT Left 08/18/2020   Procedure: LEFT ARM BRACHIOCEPHALIC ARTERIOVENOUS (AV) FISTULA CREATION;  Surgeon: Elam Dutch, MD;  Location: [facility];  Service: Vascular;  Laterality: Left;   BIOPSY  04/24/2021   Procedure: BIOPSY;  Surgeon: Eloise Harman, DO;  Location: [facility];  Service: Endoscopy;;   CESAREAN SECTION     CHOLECYSTECTOMY     COLONOSCOPY  04/24/2021   Surgeon: Eloise Harman, DO;  nonbleeding internal hemorrhoids, 1 large (25 mm) pedunculated transverse colon polyp (prolapse type polyp) with adherent clot and stigmata of recent bleed.   COLONOSCOPY WITH PROPOFOL N/A 05/14/2021   Procedure: COLONOSCOPY WITH  PROPOFOL;  Surgeon: Daneil Dolin, MD;  Location: [facility];  Service: Endoscopy;  Laterality: N/A;   ESOPHAGOGASTRODUODENOSCOPY (EGD) WITH PROPOFOL N/A 04/24/2021   Surgeon: Eloise Harman, DO;  duodenal erosions and gastritis biopsied (pathology with peptic duodenitis, reactive gastropathy with erosions/chronic inflammation, negative for H. pylori)   EYE SURGERY     Vatrectomy   HEMOSTASIS CLIP PLACEMENT  05/14/2021   Procedure: HEMOSTASIS CLIP PLACEMENT;  Surgeon: Daneil Dolin, MD;  Location: [facility];  Service: Endoscopy;;   IR PERC TUN PERIT CATH WO PORT S&I /IMAG  09/15/2020   IR REMOVAL TUN CV CATH W/O FL  02/19/2021   IR US GUIDE VASC ACCESS RIGHT  09/15/2020   POLYPECTOMY  04/24/2021   Procedure: POLYPECTOMY;  Surgeon: Eloise Harman, DO;  [facility];  Service: Endoscopy;;   POLYPECTOMY  05/14/2021   Procedure: POLYPECTOMY;  Surgeon: Daneil Dolin, MD;  Location: [facility];  Service: Endoscopy;;   TOE SURGERY      Family History  Problem Relation Age of Onset   Heart disease Mother    Diabetes Mother    Kidney disease Mother    Diabetes Father    Heart disease Father    Diabetes Brother    Colon cancer Neg Hx     Social History   Social History Narrative   Not on file     Review of Systems General: Denies fevers, chills, weight loss CV: Denies chest pain, shortness of breath, palpitations  Physical Exam Vitals with BMI 07/22/2021 05/19/2021 05/15/2021  Height '5\' 6"'$  - -  Weight 210 lbs 200 lbs -  BMI [id number] -  Systolic [id number]  Diastolic 84 60 68  Pulse 82 77 68    General:  No acute distress,  Alert and oriented, Non-Toxic, Normal speech and affect Left leg: She has woody edema from at least the knee down.  She has a large posterior wound that is at least 20 cm in greatest dimension.  There is some nonviable tissue at the base and exposed what looks like Achilles tendon.  She has no motor movement in her ankle or toes.   She has limited sensation in her foot.  No purulence.  Assessment/Plan Patient presents with a large spontaneous wound on the posterior left lower extremity.  She has had a number of other wounds come up recently that have been diagnosed as calciphylaxis and this could be a continuation of that.  I explained she appears to have very low healing potential in that area and even if the wound was cleaned up would not be optimistic about any sort of flap coverage or wound matrix coverage giving her sustained benefit.  I did bring up the potential of amputation certainly in an  extremity that has not been able to bear weight in quite some time.  She is understandably hesitant to head in that direction until she is exhausted all options at limb preservation.  I did bring up the potential for being seen in a university setting where they might have alternative solutions for her.  She is interested in pursuing that so will try to help her in that regard.  Cindra Presume 07/22/2021, 1:43 PM

## 2021-07-23 ENCOUNTER — Encounter: Payer: Medicare Other | Attending: Physician Assistant | Admitting: Physician Assistant

## 2021-07-23 DIAGNOSIS — I120 Hypertensive chronic kidney disease with stage 5 chronic kidney disease or end stage renal disease: Secondary | ICD-10-CM | POA: Diagnosis not present

## 2021-07-23 DIAGNOSIS — N186 End stage renal disease: Secondary | ICD-10-CM | POA: Insufficient documentation

## 2021-07-23 DIAGNOSIS — L89610 Pressure ulcer of right heel, unstageable: Secondary | ICD-10-CM | POA: Insufficient documentation

## 2021-07-23 DIAGNOSIS — Z992 Dependence on renal dialysis: Secondary | ICD-10-CM | POA: Insufficient documentation

## 2021-07-23 DIAGNOSIS — L89313 Pressure ulcer of right buttock, stage 3: Secondary | ICD-10-CM | POA: Insufficient documentation

## 2021-07-23 DIAGNOSIS — E1122 Type 2 diabetes mellitus with diabetic chronic kidney disease: Secondary | ICD-10-CM | POA: Insufficient documentation

## 2021-07-23 DIAGNOSIS — Z794 Long term (current) use of insulin: Secondary | ICD-10-CM | POA: Diagnosis not present

## 2021-07-23 DIAGNOSIS — E1022 Type 1 diabetes mellitus with diabetic chronic kidney disease: Secondary | ICD-10-CM | POA: Diagnosis present

## 2021-07-23 DIAGNOSIS — L89323 Pressure ulcer of left buttock, stage 3: Secondary | ICD-10-CM | POA: Insufficient documentation

## 2021-07-23 DIAGNOSIS — L97812 Non-pressure chronic ulcer of other part of right lower leg with fat layer exposed: Secondary | ICD-10-CM | POA: Diagnosis not present

## 2021-07-23 DIAGNOSIS — L97822 Non-pressure chronic ulcer of other part of left lower leg with fat layer exposed: Secondary | ICD-10-CM | POA: Insufficient documentation

## 2021-07-23 DIAGNOSIS — E11621 Type 2 diabetes mellitus with foot ulcer: Secondary | ICD-10-CM | POA: Insufficient documentation

## 2021-07-23 NOTE — Progress Notes (Addendum)
KIZZIE, COTTEN ([id number]) Visit Report for 07/23/2021 Arrival Information Details Patient Name: Amy Moses, Amy Moses Date of Service: 07/23/2021 11:00 AM Medical Record Number: [id number] Patient Account Number: [id number] Date of Birth/Sex: 29-May-1971 (50 y.o. F) Treating RN: Donnamarie Poag Primary Care Xane Amsden: Zenon Mayo Other Clinician: Referring Shaney Deckman: Zenon Mayo Treating Kadasia Kassing/Extender: Skipper Cliche in Treatment: 42 Visit Information History Since Last Visit Added or deleted any medications: No Patient Arrived: Wheel Chair Had a fall or experienced change in No Arrival Time: 11:33 activities of daily living that may affect Accompanied By: self risk of falls: Transfer Assistance: Transfer Board Hospitalized since last visit: No Patient Identification Verified: Yes Has Dressing in Place as Prescribed: Yes Secondary Verification Process Completed: Yes Pain Present Now: No Patient Requires Transmission-Based No Precautions: Patient Has Alerts: Yes Patient Alerts: Patient on Blood Thinner ASPIRIN Electronic Signature(s) Signed: 07/23/2021 1:30:21 PM By: Donnamarie Poag Entered By: Donnamarie Poag on 07/23/2021 11:35:37 Amy Moses ([id number]) -------------------------------------------------------------------------------- Clinic Level of Care Assessment Details Patient Name: Amy Moses Date of Service: 07/23/2021 11:00 AM Medical Record Number: [id number] Patient Account Number: [id number] Date of Birth/Sex: Jun 17, 1971 (49 y.o. F) Treating RN: Donnamarie Poag Primary Care Argentina Kosch: Zenon Mayo Other Clinician: Referring Vondell Babers: Zenon Mayo Treating Quinnlyn Hearns/Extender: Skipper Cliche in Treatment: 33 Clinic Level of Care Assessment Items TOOL 4 Quantity Score X - Use when only an EandM is performed on FOLLOW-UP visit 1 0 ASSESSMENTS - Nursing Assessment / Reassessment X - Reassessment of Co-morbidities (includes updates in patient status) 1 10 X- 1  5 Reassessment of Adherence to Treatment Plan ASSESSMENTS - Wound and Skin Assessment / Reassessment []  - Simple Wound Assessment / Reassessment - one wound 0 X- 7 5 Complex Wound Assessment / Reassessment - multiple wounds []  - 0 Dermatologic / Skin Assessment (not related to wound area) ASSESSMENTS - Focused Assessment []  - Circumferential Edema Measurements - multi extremities 0 []  - 0 Nutritional Assessment / Counseling / Intervention []  - 0 Lower Extremity Assessment (monofilament, tuning fork, pulses) []  - 0 Peripheral Arterial Disease Assessment (using hand held doppler) ASSESSMENTS - Ostomy and/or Continence Assessment and Care []  - Incontinence Assessment and Management 0 []  - 0 Ostomy Care Assessment and Management (repouching, etc.) PROCESS - Coordination of Care X - Simple Patient / Family Education for ongoing care 1 15 []  - 0 Complex (extensive) Patient / Family Education for ongoing care X- 1 10 Staff obtains Programmer, systems, Records, Test Results / Process Orders []  - 0 Staff telephones HHA, Nursing Homes / Clarify orders / etc []  - 0 Routine Transfer to another Facility (non-emergent condition) []  - 0 Routine Hospital Admission (non-emergent condition) []  - 0 New Admissions / Biomedical engineer / Ordering NPWT, Apligraf, etc. []  - 0 Emergency Hospital Admission (emergent condition) X- 1 10 Simple Discharge Coordination []  - 0 Complex (extensive) Discharge Coordination PROCESS - Special Needs []  - Pediatric / Minor Patient Management 0 []  - 0 Isolation Patient Management []  - 0 Hearing / Language / Visual special needs []  - 0 Assessment of Community assistance (transportation, D/C planning, etc.) []  - 0 Additional assistance / Altered mentation []  - 0 Support Surface(s) Assessment (bed, cushion, seat, etc.) INTERVENTIONS - Wound Cleansing / Measurement Amy Moses, Amy Moses ([id number]) []  - 0 Simple Wound Cleansing - one wound X- 7 5 Complex Wound  Cleansing - multiple wounds X- 1 5 Wound Imaging (photographs - any number of wounds) []  - 0 Wound Tracing (instead of photographs) []  - 0 Simple Wound Measurement - one wound X- 7  5 Complex Wound Measurement - multiple wounds INTERVENTIONS - Wound Dressings X - Small Wound Dressing one or multiple wounds 5 10 X- 2 15 Medium Wound Dressing one or multiple wounds []  - 0 Large Wound Dressing one or multiple wounds []  - 0 Application of Medications - topical []  - 0 Application of Medications - injection INTERVENTIONS - Miscellaneous []  - External ear exam 0 []  - 0 Specimen Collection (cultures, biopsies, blood, body fluids, etc.) []  - 0 Specimen(s) / Culture(s) sent or taken to Lab for analysis []  - 0 Patient Transfer (multiple staff / Civil Service fast streamer / Similar devices) []  - 0 Simple Staple / Suture removal (25 or less) []  - 0 Complex Staple / Suture removal (26 or more) []  - 0 Hypo / Hyperglycemic Management (close monitor of Blood Glucose) []  - 0 Ankle / Brachial Index (ABI) - do not check if billed separately X- 1 5 Vital Signs Has the patient been seen at the hospital within the last three years: Yes Total Score: 245 Level Of Care: New/Established - Level 5 Electronic Signature(s) Signed: 07/23/2021 1:30:21 PM By: Donnamarie Poag Entered By: Donnamarie Poag on 07/23/2021 11:58:21 Amy Moses ([id number]) -------------------------------------------------------------------------------- Encounter Discharge Information Details Patient Name: Amy Moses Date of Service: 07/23/2021 11:00 AM Medical Record Number: [id number] Patient Account Number: [id number] Date of Birth/Sex: 1971/09/26 (49 y.o. F) Treating RN: Donnamarie Poag Primary Care Emitt Maglione: Zenon Mayo Other Clinician: Referring Pansie Guggisberg: Zenon Mayo Treating Catrell Morrone/Extender: Skipper Cliche in Treatment: 71 Encounter Discharge Information Items Discharge Condition: Stable Ambulatory Status:  Wheelchair Discharge Destination: Home Transportation: Private Auto Accompanied By: self Schedule Follow-up Appointment: Yes Clinical Summary of Care: Patient Declined Electronic Signature(s) Signed: 07/23/2021 1:30:21 PM By: Donnamarie Poag Entered By: Donnamarie Poag on 07/23/2021 11:59:30 Amy Moses ([id number]) -------------------------------------------------------------------------------- Lower Extremity Assessment Details Patient Name: Amy Moses Date of Service: 07/23/2021 11:00 AM Medical Record Number: [id number] Patient Account Number: [id number] Date of Birth/Sex: June 06, 1971 (49 y.o. F) Treating RN: Donnamarie Poag Primary Care Karey Suthers: Zenon Mayo Other Clinician: Referring Amy Gildner: Zenon Mayo Treating Areanna Gengler/Extender: Skipper Cliche in Treatment: 33 Vascular Assessment Pulses: Dorsalis Pedis Palpable: [Left:Yes] [Right:Yes] Electronic Signature(s) Signed: 07/23/2021 1:30:21 PM By: Donnamarie Poag Entered By: Donnamarie Poag on 07/23/2021 11:54:16 Amy Moses ([id number]) -------------------------------------------------------------------------------- Multi Wound Chart Details Patient Name: Amy Moses Date of Service: 07/23/2021 11:00 AM Medical Record Number: [id number] Patient Account Number: [id number] Date of Birth/Sex: 1971-03-16 (49 y.o. F) Treating RN: Donnamarie Poag Primary Care Sears Oran: Zenon Mayo Other Clinician: Referring Sona Nations: Zenon Mayo Treating Satrina Magallanes/Extender: Skipper Cliche in Treatment: 33 Vital Signs Height(in): 25 Pulse(bpm): 63 Weight(lbs): 235 Blood Pressure(mmHg): 154/80 Body Mass Index(BMI): 38 Temperature(F): 98.2 Respiratory Rate(breaths/min): 16 Photos: [1:No Photos] [10:No Photos] [11:No Photos] Wound Location: [1:Right, Medial Upper Leg] [10:Right, Lateral Calf] [11:Right, Distal, Posterior Lower Leg] Wounding Event: [1:Gradually Appeared] [10:Gradually Appeared] [11:Gradually Appeared] Primary Etiology:  [1:Calciphylaxis] [10:Calciphylaxis] [11:Calciphylaxis] Comorbid History: [1:N/A] [10:N/A] [11:Cataracts, Glaucoma, Anemia, Hypertension, Type II Diabetes, End Stage Renal Disease, Neuropathy] Date Acquired: [1:07/22/2020] [10:02/16/2021] [11:03/21/2021] Weeks of Treatment: [1:33] [10:22] [11:15] Wound Status: [1:Healed - Epithelialized] [10:Converted] [11:Open] Clustered Wound: [1:No] [10:No] [11:No] Clustered Quantity: [1:N/A] [10:N/A] [11:N/A] Measurements L x W x D (cm) [1:0x0x0] [10:0x0x0] [11:11.4x9x0.5] Area (cm) : [1:0] [10:0] [11:80.582] Volume (cm) : [1:0] [10:0] [11:40.291] % Reduction in Area: [1:100.00%] [10:100.00%] [11:-1952.00%] % Reduction in Volume: [1:100.00%] [10:100.00%] [11:-10152.20%] Classification: [1:Full Thickness Without Exposed Support Structures] [10:Full Thickness Without Exposed Support Structures] [11:Full Thickness Without Exposed Support Structures] Exudate Amount: [1:None Present] [10:Large] [11:Medium] Exudate Type: [1:N/A] [10:Serosanguineous] [  11:Serosanguineous] Exudate Color: [1:N/A] [10:red, brown] [11:red, brown] Foul Odor After Cleansing: [1:N/A] [10:N/A] [11:No] Odor Anticipated Due to Product N/A [10:N/A] [11:N/A] Use: Granulation Amount: [1:N/A] [10:N/A] [11:Medium (34-66%)] Granulation Quality: [1:N/A] [10:N/A] [11:Red] Necrotic Amount: [1:N/A] [10:N/A] [11:Medium (34-66%)] Necrotic Tissue: [1:N/A N/A] [10:N/A N/A] [11:Eschar, Adherent Slough None] Wound Number: 13 [id number] 15  Photos: No Photos No Photos No Photos Wound Location: Right Gluteus Left Gluteus Left Gluteal fold Wounding Event: Gradually Appeared Gradually Appeared Gradually Appeared Primary Etiology: Pressure Ulcer Pressure Ulcer Pressure Ulcer Comorbid History: Cataracts, Glaucoma, Anemia, Cataracts, Glaucoma, Anemia, Cataracts, Glaucoma, Anemia, Hypertension, Type II Diabetes, End Hypertension, Type II Diabetes, End Hypertension, Type II Diabetes, End Stage Renal Disease, Neuropathy  Stage Renal Disease, Neuropathy Stage Renal Disease, Neuropathy Date Acquired: 05/31/2021 05/28/2021 06/02/2021 Weeks of Treatment: 7 7 5  Wound Status: Healed - Epithelialized Open Open Clustered Wound: No No Yes Clustered Quantity: N/A N/A N/A Measurements L x W x D (cm) 0x0x0 5x2.5x0.2 3x1.5x0.2 Area (cm) : 0 9.817 3.534 Volume (cm) : 0 1.963 0.707 % Reduction in Area: 100.00% -262.30% 40.00% % Reduction in Volume: 100.00% -624.40% -20.00% Classification: Category/Stage II Category/Stage II Category/Stage III Exudate Amount: None Present Medium Medium Exudate Type: N/A Serosanguineous Serosanguineous Amy Moses, Amy Moses ([id number]) Exudate Color: N/A red, brown red, brown Foul Odor After Cleansing: No No No Odor Anticipated Due to Product N/A N/A N/A Use: Granulation Amount: None Present (0%) Medium (34-66%) Medium (34-66%) Granulation Quality: N/A Red N/A Necrotic Amount: None Present (0%) Medium (34-66%) Medium (34-66%) Necrotic Tissue: N/A Adherent Amy Moses Exposed Structures: Fascia: No Fat Layer (Subcutaneous Tissue): Fat Layer (Subcutaneous Tissue): Fat Layer (Subcutaneous Tissue): Yes Yes No Fascia: No Tendon: No Tendon: No Muscle: No Muscle: No Joint: No Joint: No Bone: No Bone: No Epithelialization: Large (67-100%) Medium (34-66%) Small (1-33%) Wound Number: 2 3 4  Photos: No Photos No Photos No Photos Wound Location: Left, Medial Upper Leg Left, Lateral Upper Leg Right Calcaneus Wounding Event: Gradually Appeared Gradually Appeared Gradually Appeared Primary Etiology: Calciphylaxis Calciphylaxis Pressure Ulcer Comorbid History: Cataracts, Glaucoma, Anemia, Cataracts, Glaucoma, Anemia, Cataracts, Glaucoma, Anemia, Hypertension, Type II Diabetes, End Hypertension, Type II Diabetes, End Hypertension, Type II Diabetes, End Stage Renal Disease, Neuropathy Stage Renal Disease, Neuropathy Stage Renal Disease, Neuropathy Date Acquired: 07/22/2020 07/22/2020  10/21/2020 Weeks of Treatment: 33 33 33 Wound Status: Open Open Open Clustered Wound: No Yes No Clustered Quantity: N/A 3 N/A Measurements L x W x D (cm) 1x1x0.1 4x8x0.1 4.2x3x0.1 Area (cm) : 0.785 25.133 9.896 Volume (cm) : 0.079 2.513 0.99 % Reduction in Area: 99.40% 0.00% 65.00% % Reduction in Volume: 99.40% 0.00% 65.00% Classification: Full Thickness Without Exposed Full Thickness Without Exposed Unstageable/Unclassified Support Structures Support Structures Exudate Amount: Medium Large Large Exudate Type: Serosanguineous Purulent Serosanguineous Exudate Color: red, brown yellow, brown, green red, brown Foul Odor After Cleansing: No Yes No Odor Anticipated Due to Product N/A No N/A Use: Granulation Amount: Large (67-100%) Large (67-100%) Large (67-100%) Granulation Quality: N/A N/A Red Necrotic Amount: Small (1-33%) None Present (0%) Small (1-33%) Necrotic Tissue: Adherent Amy Moses Exposed Structures: Fat Layer (Subcutaneous Tissue): Fat Layer (Subcutaneous Tissue): Fat Layer (Subcutaneous Tissue): Yes Yes Yes Fascia: No Fascia: No Fascia: No Tendon: No Tendon: No Tendon: No Muscle: No Muscle: No Muscle: No Joint: No Joint: No Joint: No Bone: No Bone: No Bone: No Epithelialization: Small (1-33%) Large (67-100%) Small (1-33%) Wound Number: 9 N/A N/A Photos: No Photos N/A N/A Wound Location: Left, Lateral Calf N/A N/A Wounding Event: Gradually  Appeared N/A N/A Primary Etiology: Calciphylaxis N/A N/A Comorbid History: Cataracts, Glaucoma, Anemia, N/A N/A Hypertension, Type II Diabetes, End Stage Renal Disease, Neuropathy Date Acquired: 02/16/2021 N/A N/A Weeks of Treatment: 22 N/A N/A Wound Status: Open N/A N/A Clustered Wound: No N/A N/A Clustered Quantity: N/A N/A N/A Measurements L x W x D (cm) 15.1x11.5x3.5 N/A N/A Area (cm) : 136.384 N/A N/A Volume (cm) : 477.345 N/A N/A % Reduction in Area: -1873.10% N/A N/A % Reduction in Volume:  -68980.30% N/A N/A Amy Moses, Amy Moses ([id number]) Classification: Full Thickness With Exposed N/A N/A Support Structures Exudate Amount: Large N/A N/A Exudate Type: Serosanguineous N/A N/A Exudate Color: red, brown N/A N/A Foul Odor After Cleansing: Yes N/A N/A Odor Anticipated Due to Product No N/A N/A Use: Granulation Amount: Small (1-33%) N/A N/A Granulation Quality: Red N/A N/A Necrotic Amount: Large (67-100%) N/A N/A Necrotic Tissue: Eschar, Adherent Slough N/A N/A Exposed Structures: Fascia: Yes N/A N/A Fat Layer (Subcutaneous Tissue): Yes Tendon: Yes Muscle: No Joint: No Bone: No Epithelialization: None N/A N/A Treatment Notes Electronic Signature(s) Signed: 07/23/2021 1:30:21 PM By: Donnamarie Poag Entered By: Donnamarie Poag on 07/23/2021 11:56:29 Amy Moses ([id number]) -------------------------------------------------------------------------------- Multi-Disciplinary Care Plan Details Patient Name: Amy Moses Date of Service: 07/23/2021 11:00 AM Medical Record Number: [id number] Patient Account Number: [id number] Date of Birth/Sex: 08/29/1971 (49 y.o. F) Treating RN: Donnamarie Poag Primary Care Anand Tejada: Zenon Mayo Other Clinician: Referring Laneah Luft: Zenon Mayo Treating Danyell Shader/Extender: Skipper Cliche in Treatment: 33 Active Inactive Necrotic Tissue Nursing Diagnoses: Impaired tissue integrity related to necrotic/devitalized tissue Goals: Necrotic/devitalized tissue will be minimized in the wound bed Date Initiated: 12/02/2020 Target Resolution Date: 07/05/2021 Goal Status: Active Patient/caregiver will verbalize understanding of reason and process for debridement of necrotic tissue Date Initiated: 12/02/2020 Date Inactivated: 01/15/2021 Target Resolution Date: 01/02/2021 Goal Status: Met Interventions: Assess patient pain level pre-, during and post procedure and prior to discharge Provide education on necrotic tissue and debridement  process Notes: Soft Tissue Infection Nursing Diagnoses: Impaired tissue integrity Potential for infection: soft tissue Goals: Patient will remain free of wound infection Date Initiated: 07/08/2021 Target Resolution Date: 07/08/2021 Goal Status: Active Patient/caregiver will verbalize understanding of or measures to prevent infection and contamination in the home setting Date Initiated: 07/08/2021 Target Resolution Date: 07/08/2021 Goal Status: Active Patient's soft tissue infection will resolve Date Initiated: 07/08/2021 Target Resolution Date: 07/15/2021 Goal Status: Active Signs and symptoms of infection will be recognized early to allow for prompt treatment Date Initiated: 07/08/2021 Target Resolution Date: 07/08/2021 Goal Status: Active Interventions: Assess signs and symptoms of infection every visit Treatment Activities: Culture and sensitivity : 07/08/2021 Notes: Electronic Signature(s) Signed: 07/23/2021 1:30:21 PM By: Donnamarie Poag Entered By: Donnamarie Poag on 07/23/2021 11:54:24 Amy Moses ([id number]) -------------------------------------------------------------------------------- Pain Assessment Details Patient Name: Amy Moses Date of Service: 07/23/2021 11:00 AM Medical Record Number: [id number] Patient Account Number: [id number] Date of Birth/Sex: Jul 14, 1971 (50 y.o. F) Treating RN: Donnamarie Poag Primary Care Anaston Koehn: Zenon Mayo Other Clinician: Referring Neya Creegan: Zenon Mayo Treating Bailie Christenbury/Extender: Skipper Cliche in Treatment: 33 Active Problems Location of Pain Severity and Description of Pain Patient Has Paino No Site Locations Rate the pain. Current Pain Level: 0 Pain Management and Medication Current Pain Management: Electronic Signature(s) Signed: 07/23/2021 1:30:21 PM By: Donnamarie Poag Entered By: Donnamarie Poag on 07/23/2021 11:36:08 Amy Moses  ([id number]) -------------------------------------------------------------------------------- Patient/Caregiver Education Details Patient Name: Amy Moses Date of Service: 07/23/2021 11:00 AM Medical Record Number: [id number] Patient Account Number: [id number] Date of Birth/Gender: [birth year] (49 y.o. F) Treating  RN: Donnamarie Poag Primary Care Physician: Zenon Mayo Other Clinician: Referring Physician: Zenon Mayo Treating Physician/Extender: Skipper Cliche in Treatment: 15 Education Assessment Education Provided To: Patient Education Topics Provided Wound Debridement: Methods: Explain/Verbal Responses: State content correctly Electronic Signature(s) Signed: 07/23/2021 1:30:21 PM By: Donnamarie Poag Entered By: Donnamarie Poag on 07/23/2021 11:58:42 Amy Moses ([id number]) -------------------------------------------------------------------------------- Wound Assessment Details Patient Name: Amy Moses Date of Service: 07/23/2021 11:00 AM Medical Record Number: [id number] Patient Account Number: [id number] Date of Birth/Sex: 26-Jun-1971 (49 y.o. F) Treating RN: Donnamarie Poag Primary Care Jelan Batterton: Zenon Mayo Other Clinician: Referring Jezabelle Chisolm: Zenon Mayo Treating Benton Tooker/Extender: Skipper Cliche in Treatment: 33 Wound Status Wound Number: 1 Primary Etiology: Calciphylaxis Wound Location: Right, Medial Upper Leg Wound Status: Healed - Epithelialized Wounding Event: Gradually Appeared Date Acquired: 07/22/2020 Weeks Of Treatment: 33 Clustered Wound: No Wound Measurements Length: (cm) 0 Width: (cm) 0 Depth: (cm) 0 Area: (cm) 0 Volume: (cm) 0 % Reduction in Area: 100% % Reduction in Volume: 100% Wound Description Classification: Full Thickness Without Exposed Support Structure Exudate Amount: None Present s Treatment Notes Wound #1 (Upper Leg) Wound Laterality: Right, Medial Cleanser Peri-Wound Care Topical Primary Dressing Secondary  Dressing Secured With Compression Wrap Compression Stockings Add-Ons Electronic Signature(s) Signed: 07/23/2021 1:30:21 PM By: Donnamarie Poag Entered ByDonnamarie Poag on 07/23/2021 11:39:39 Amy Moses ([id number]) -------------------------------------------------------------------------------- Wound Assessment Details Patient Name: Amy Moses Date of Service: 07/23/2021 11:00 AM Medical Record Number: [id number] Patient Account Number: [id number] Date of Birth/Sex: 06-24-1971 (49 y.o. F) Treating RN: Donnamarie Poag Primary Care Tanae Petrosky: Zenon Mayo Other Clinician: Referring Aadhira Heffernan: Zenon Mayo Treating Irven Ingalsbe/Extender: Skipper Cliche in Treatment: 33 Wound Status Wound Number: 10 Primary Etiology: Calciphylaxis Wound Location: Right, Lateral Calf Wound Status: Converted Wounding Event: Gradually Appeared Date Acquired: 02/16/2021 Weeks Of Treatment: 22 Clustered Wound: No Wound Measurements Length: (cm) 0 Width: (cm) 0 Depth: (cm) 0 Area: (cm) Volume: (cm) % Reduction in Area: 100% % Reduction in Volume: 100% 0 0 Wound Description Classification: Full Thickness Without Exposed Support Structu Exudate Amount: Large Exudate Type: Serosanguineous Exudate Color: red, brown res Treatment Notes Wound #10 (Calf) Wound Laterality: Right, Lateral Cleanser Peri-Wound Care Topical Primary Dressing Secondary Dressing Secured With Compression Wrap Compression Stockings Add-Ons Electronic Signature(s) Signed: 07/23/2021 1:30:21 PM By: Donnamarie Poag Entered ByDonnamarie Poag on 07/23/2021 11:41:19 Amy Moses ([id number]) -------------------------------------------------------------------------------- Wound Assessment Details Patient Name: Amy Moses Date of Service: 07/23/2021 11:00 AM Medical Record Number: [id number] Patient Account Number: [id number] Date of Birth/Sex: 1971/06/24 (49 y.o. F) Treating RN: Donnamarie Poag Primary Care Mitsuo Budnick:  Zenon Mayo Other Clinician: Referring Takita Riecke: Zenon Mayo Treating Zahari Xiang/Extender: Skipper Cliche in Treatment: 33 Wound Status Wound Number: 11 Primary Calciphylaxis Etiology: Wound Location: Right, Distal, Posterior Lower Leg Wound Open Wounding Event: Gradually Appeared Status: Date Acquired: 03/21/2021 Comorbid Cataracts, Glaucoma, Anemia, Hypertension, Type II Weeks Of Treatment: 15 History: Diabetes, End Stage Renal Disease, Neuropathy Clustered Wound: No Photos Photo Uploaded ByDonnamarie Poag on 07/23/2021 13:07:47 Wound Measurements Length: (cm) 11.4 Width: (cm) 9 Depth: (cm) 0.5 Area: (cm) 80.582 Volume: (cm) 40.291 % Reduction in Area: -1952% % Reduction in Volume: -10152.2% Epithelialization: None Tunneling: No Undermining: No Wound Description Classification: Full Thickness Without Exposed Support Structu Exudate Amount: Medium Exudate Type: Serosanguineous Exudate Color: red, brown res Foul Odor After Cleansing: No Slough/Fibrino Yes Wound Bed Granulation Amount: Medium (34-66%) Exposed Structure Granulation Quality: Red Fascia Exposed: No Necrotic Amount: Medium (34-66%) Fat Layer (Subcutaneous Tissue) Exposed: Yes Necrotic Quality: Eschar, Adherent Slough Tendon Exposed:  No Muscle Exposed: No Joint Exposed: No Bone Exposed: No Treatment Notes Wound #11 (Lower Leg) Wound Laterality: Right, Posterior, Distal Cleanser Dakin 16 (oz) 0.25 Discharge Instruction: Use as directed. Peri-Wound Care Amy Moses, Amy Moses ([id number]) Topical Primary Dressing Gauze Discharge Instruction: As directed: dry, moistened with saline or moistened with Dakins Solution Secondary Dressing ABD Pad 5x9 (in/in) Discharge Instruction: Cover with ABD pad Secured With 55M [employer] Surgical Tape, 2x2 (in/yd) Conforming Stretch Gauze Bandage 4x75 (in/in) Discharge Instruction: Apply as directed Compression Wrap Compression  Stockings Add-Ons Electronic Signature(s) Signed: 07/23/2021 1:30:21 PM By: Donnamarie Poag Entered By: Donnamarie Poag on 07/23/2021 11:49:31 Amy Moses ([id number]) -------------------------------------------------------------------------------- Wound Assessment Details Patient Name: Amy Moses Date of Service: 07/23/2021 11:00 AM Medical Record Number: [id number] Patient Account Number: [id number] Date of Birth/Sex: 04/09/1971 (49 y.o. F) Treating RN: Donnamarie Poag Primary Care Makinsley Schiavi: Zenon Mayo Other Clinician: Referring Arjuna Doeden: Zenon Mayo Treating Darielys Giglia/Extender: Skipper Cliche in Treatment: 33 Wound Status Wound Number: 13 Primary Pressure Ulcer Etiology: Wound Location: Right Gluteus Wound Healed - Epithelialized Wounding Event: Gradually Appeared Status: Date Acquired: 05/31/2021 Comorbid Cataracts, Glaucoma, Anemia, Hypertension, Type II Weeks Of Treatment: 7 History: Diabetes, End Stage Renal Disease, Neuropathy Clustered Wound: No Photos Photo Uploaded ByDonnamarie Poag on 07/23/2021 13:07:47 Wound Measurements Length: (cm) 0 % Red Width: (cm) 0 % Red Depth: (cm) 0 Epith Area: (cm) 0 Tunn Volume: (cm) 0 Unde uction in Area: 100% uction in Volume: 100% elialization: Large (67-100%) eling: No rmining: No Wound Description Classification: Category/Stage II Foul Exudate Amount: None Present Slou Odor After Cleansing: No gh/Fibrino No Wound Bed Granulation Amount: None Present (0%) Exposed Structure Necrotic Amount: None Present (0%) Fascia Exposed: No Fat Layer (Subcutaneous Tissue) Exposed: No Tendon Exposed: No Muscle Exposed: No Joint Exposed: No Bone Exposed: No Treatment Notes Wound #13 (Gluteus) Wound Laterality: Right Cleanser Peri-Wound Care Topical Primary Dressing MADDIE, BRAZIER ([id number]) Secondary Dressing Secured With Compression Wrap Compression Stockings Add-Ons Electronic Signature(s) Signed: 07/23/2021 1:30:21  PM By: Donnamarie Poag Entered By: Donnamarie Poag on 07/23/2021 11:50:05 Amy Moses ([id number]) -------------------------------------------------------------------------------- Wound Assessment Details Patient Name: Amy Moses Date of Service: 07/23/2021 11:00 AM Medical Record Number: [id number] Patient Account Number: [id number] Date of Birth/Sex: 08-16-1971 (49 y.o. F) Treating RN: Donnamarie Poag Primary Care Camerin Ladouceur: Zenon Mayo Other Clinician: Referring Clessie Karras: Zenon Mayo Treating Kaysan Peixoto/Extender: Skipper Cliche in Treatment: 33 Wound Status Wound Number: 14 Primary Pressure Ulcer Etiology: Wound Location: Left Gluteus Wound Open Wounding Event: Gradually Appeared Status: Date Acquired: 05/28/2021 Comorbid Cataracts, Glaucoma, Anemia, Hypertension, Type II Weeks Of Treatment: 7 History: Diabetes, End Stage Renal Disease, Neuropathy Clustered Wound: No Photos Photo Uploaded ByDonnamarie Poag on 07/23/2021 13:06:09 Wound Measurements Length: (cm) 5 Width: (cm) 2.5 Depth: (cm) 0.2 Area: (cm) 9.817 Volume: (cm) 1.963 % Reduction in Area: -262.3% % Reduction in Volume: -624.4% Epithelialization: Medium (34-66%) Tunneling: No Undermining: No Wound Description Classification: Category/Stage II Exudate Amount: Medium Exudate Type: Serosanguineous Exudate Color: red, brown Foul Odor After Cleansing: No Slough/Fibrino Yes Wound Bed Granulation Amount: Medium (34-66%) Exposed Structure Granulation Quality: Red Fascia Exposed: No Necrotic Amount: Medium (34-66%) Fat Layer (Subcutaneous Tissue) Exposed: Yes Necrotic Quality: Adherent Slough Tendon Exposed: No Muscle Exposed: No Joint Exposed: No Bone Exposed: No Treatment Notes Wound #14 (Gluteus) Wound Laterality: Left Cleanser Soap and Water Discharge Instruction: Gently cleanse wound with antibacterial soap, rinse and pat dry prior to dressing wounds Peri-Wound Care [place], Vito Berger  ([id number]) Topical Primary Dressing Hydrofera Blue Ready Transfer Foam, 2.5x2.5 (  in/in) Discharge Instruction: Apply Hydrofera Blue Ready to wound bed as directed Secondary Dressing Zetuvit Plus Silicone Border Dressing 4x4 (in/in) Secured With Compression Wrap Compression Stockings Add-Ons Electronic Signature(s) Signed: 07/23/2021 1:30:21 PM By: Donnamarie Poag Entered By: Donnamarie Poag on 07/23/2021 11:51:55 Amy Moses ([id number]) -------------------------------------------------------------------------------- Wound Assessment Details Patient Name: Amy Moses Date of Service: 07/23/2021 11:00 AM Medical Record Number: [id number] Patient Account Number: [id number] Date of Birth/Sex: 1971/09/04 (49 y.o. F) Treating RN: Donnamarie Poag Primary Care Courvoisier Hamblen: Zenon Mayo Other Clinician: Referring Ladashia Demarinis: Zenon Mayo Treating Arthor Gorter/Extender: Skipper Cliche in Treatment: 33 Wound Status Wound Number: 15 Primary Pressure Ulcer Etiology: Wound Location: Left Gluteal fold Wound Open Wounding Event: Gradually Appeared Status: Date Acquired: 06/02/2021 Comorbid Cataracts, Glaucoma, Anemia, Hypertension, Type II Weeks Of Treatment: 5 History: Diabetes, End Stage Renal Disease, Neuropathy Clustered Wound: Yes Photos Photo Uploaded By: Donnamarie Poag on 07/23/2021 13:06:10 Wound Measurements Length: (cm) 3 Width: (cm) 1.5 Depth: (cm) 0.2 Area: (cm) 3.534 Volume: (cm) 0.707 % Reduction in Area: 40% % Reduction in Volume: -20% Epithelialization: Small (1-33%) Tunneling: No Undermining: No Wound Description Classification: Category/Stage III Exudate Amount: Medium Exudate Type: Serosanguineous Exudate Color: red, brown Foul Odor After Cleansing: No Slough/Fibrino Yes Wound Bed Granulation Amount: Medium (34-66%) Exposed Structure Necrotic Amount: Medium (34-66%) Fat Layer (Subcutaneous Tissue) Exposed: Yes Necrotic Quality: Adherent Slough Treatment  Notes Wound #15 (Gluteal fold) Wound Laterality: Left Cleanser Soap and Water Discharge Instruction: Gently cleanse wound with antibacterial soap, rinse and pat dry prior to dressing wounds Peri-Wound Care Topical Primary Dressing Hydrofera Blue Ready Transfer Foam, 2.5x2.5 (in/in) SOLARIS, KRAM ([id number]) Discharge Instruction: Apply Hydrofera Blue Ready to wound bed as directed Secondary Dressing Zetuvit Plus Silicone Border Dressing 4x4 (in/in) Secured With Compression Wrap Compression Stockings Add-Ons Electronic Signature(s) Signed: 07/23/2021 1:30:21 PM By: Donnamarie Poag Entered By: Donnamarie Poag on 07/23/2021 11:50:57 Amy Moses ([id number]) -------------------------------------------------------------------------------- Wound Assessment Details Patient Name: Amy Moses Date of Service: 07/23/2021 11:00 AM Medical Record Number: [id number] Patient Account Number: [id number] Date of Birth/Sex: February 16, 1971 (49 y.o. F) Treating RN: Donnamarie Poag Primary Care Esty Ahuja: Zenon Mayo Other Clinician: Referring Lorance Pickeral: Zenon Mayo Treating Aine Strycharz/Extender: Skipper Cliche in Treatment: 33 Wound Status Wound Number: 16 Primary Pressure Ulcer Etiology: Wound Location: Left, Distal, Plantar Foot Wound Open Wounding Event: Pressure Injury Status: Date Acquired: 07/23/2021 Comorbid Cataracts, Glaucoma, Anemia, Hypertension, Type II Weeks Of Treatment: 0 History: Diabetes, End Stage Renal Disease, Neuropathy Clustered Wound: No Photos Photo Uploaded ByDonnamarie Poag on 07/23/2021 13:06:10 Wound Measurements Length: (cm) 2.5 Width: (cm) 3 Depth: (cm) 0.1 Area: (cm) 5.89 Volume: (cm) 0.589 % Reduction in Area: % Reduction in Volume: Tunneling: No Undermining: No Wound Description Classification: Unstageable/Unclassified Exudate Amount: None Present Foul Odor After Cleansing: No Slough/Fibrino Yes Wound Bed Granulation Amount: None Present  (0%) Necrotic Amount: Large (67-100%) Necrotic Quality: Eschar Treatment Notes Wound #16 (Foot) Wound Laterality: Plantar, Left, Distal Cleanser Soap and Water Discharge Instruction: Gently cleanse wound with antibacterial soap, rinse and pat dry prior to dressing wounds Peri-Wound Care Topical Primary Dressing Hydrofera Blue Ready Transfer Foam, 2.5x2.5 (in/in) Amy Moses, Amy Moses ([id number]) Discharge Instruction: Apply Hydrofera Blue Ready to wound bed as directed Secondary Dressing ABD Pad 5x9 (in/in) Discharge Instruction: Cover with ABD pad Secured With 37M Medipore H Soft Cloth Surgical Tape, 2x2 (in/yd) Kerlix Roll Sterile or Non-Sterile 6-ply 4.5x4 (yd/yd) Discharge Instruction: Apply Kerlix as directed Compression Wrap Compression Stockings Add-Ons Electronic Signature(s) Signed: 07/23/2021 1:30:21 PM By: Donnamarie Poag Entered By: Donnamarie Poag on  07/23/2021 12:07:47 Amy Moses, Amy Moses ([id number]) -------------------------------------------------------------------------------- Wound Assessment Details Patient Name: Amy Moses, Amy Moses Date of Service: 07/23/2021 11:00 AM Medical Record Number: [id number] Patient Account Number: [id number] Date of Birth/Sex: 11-03-1971 (50 y.o. F) Treating RN: Donnamarie Poag Primary Care Eura Radabaugh: Zenon Mayo Other Clinician: Referring Hilario Robarts: Zenon Mayo Treating Vernesha Talbot/Extender: Skipper Cliche in Treatment: 33 Wound Status Wound Number: 2 Primary Calciphylaxis Etiology: Wound Location: Left, Medial Upper Leg Wound Open Wounding Event: Gradually Appeared Status: Date Acquired: 07/22/2020 Comorbid Cataracts, Glaucoma, Anemia, Hypertension, Type II Weeks Of Treatment: 33 History: Diabetes, End Stage Renal Disease, Neuropathy Clustered Wound: No Photos Photo Uploaded ByDonnamarie Poag on 07/23/2021 13:06:10 Wound Measurements Length: (cm) 1 Width: (cm) 1 Depth: (cm) 0.1 Area: (cm) 0.785 Volume: (cm) 0.079 % Reduction in Area:  99.4% % Reduction in Volume: 99.4% Epithelialization: Small (1-33%) Tunneling: No Undermining: No Wound Description Classification: Full Thickness Without Exposed Support Structures Exudate Amount: Medium Exudate Type: Serosanguineous Exudate Color: red, brown Foul Odor After Cleansing: No Slough/Fibrino Yes Wound Bed Granulation Amount: Large (67-100%) Exposed Structure Necrotic Amount: Small (1-33%) Fascia Exposed: No Necrotic Quality: Adherent Slough Fat Layer (Subcutaneous Tissue) Exposed: Yes Tendon Exposed: No Muscle Exposed: No Joint Exposed: No Bone Exposed: No Treatment Notes Wound #2 (Upper Leg) Wound Laterality: Left, Medial Cleanser Normal Saline Discharge Instruction: Wash your hands with soap and water. Remove old dressing, discard into plastic bag and place into trash. Cleanse the wound with Normal Saline prior to applying a clean dressing using gauze sponges, not tissues or cotton balls. Do not scrub or use excessive force. Pat dry using gauze sponges, not tissue or cotton balls. Amy Moses, Amy Moses ([id number]) Peri-Wound Care Topical Primary Dressing Hydrofera Blue Ready Transfer Foam, 2.5x2.5 (in/in) Discharge Instruction: Apply Hydrofera Blue Ready to wound bed as directed Secondary Dressing ABD Pad 5x9 (in/in) Discharge Instruction: Cover with ABD pad Secured With 12M [place] Surgical Tape, 2x2 (in/yd) Compression Wrap Compression Stockings Add-Ons Electronic Signature(s) Signed: 07/23/2021 1:30:21 PM By: Donnamarie Poag Entered By: Donnamarie Poag on 07/23/2021 11:45:55 Amy Moses ([id number]) -------------------------------------------------------------------------------- Wound Assessment Details Patient Name: Amy Moses Date of Service: 07/23/2021 11:00 AM Medical Record Number: [id number] Patient Account Number: [id number] Date of Birth/Sex: 11/22/1970 (49 y.o. F) Treating RN: Donnamarie Poag Primary Care Rai Sinagra: Zenon Mayo Other  Clinician: Referring Sigmund Morera: Zenon Mayo Treating Edson Deridder/Extender: Skipper Cliche in Treatment: 33 Wound Status Wound Number: 3 Primary Calciphylaxis Etiology: Wound Location: Left, Lateral Upper Leg Wound Open Wounding Event: Gradually Appeared Status: Date Acquired: 07/22/2020 Comorbid Cataracts, Glaucoma, Anemia, Hypertension, Type II Weeks Of Treatment: 33 History: Diabetes, End Stage Renal Disease, Neuropathy Clustered Wound: Yes Photos Photo Uploaded By: Donnamarie Poag on 07/23/2021 13:06:10 Wound Measurements Length: (cm) 4 Width: (cm) 8 Depth: (cm) 0.1 Clustered Quantity: 3 Area: (cm) 25.133 Volume: (cm) 2.513 % Reduction in Area: 0% % Reduction in Volume: 0% Epithelialization: Large (67-100%) Tunneling: No Undermining: No Wound Description Classification: Full Thickness Without Exposed Support Structures Exudate Amount: Large Exudate Type: Purulent Exudate Color: yellow, brown, green Foul Odor After Cleansing: Yes Due to Product Use: No Slough/Fibrino No Wound Bed Granulation Amount: Large (67-100%) Exposed Structure Necrotic Amount: None Present (0%) Fascia Exposed: No Fat Layer (Subcutaneous Tissue) Exposed: Yes Tendon Exposed: No Muscle Exposed: No Joint Exposed: No Bone Exposed: No Treatment Notes Wound #3 (Upper Leg) Wound Laterality: Left, Lateral Cleanser Normal Saline Discharge Instruction: Wash your hands with soap and water. Remove old dressing, discard into plastic bag and place into trash. Cleanse the wound with Normal  Saline prior to applying a clean dressing using gauze sponges, not tissues or cotton balls. Do not Amy Moses, Amy Moses ([id number]) scrub or use excessive force. Pat dry using gauze sponges, not tissue or cotton balls. Peri-Wound Care Topical Primary Dressing Hydrofera Blue Classic Foam Rope Dressing, 9x6 (mm/in) Secondary Dressing ABD Pad 5x9 (in/in) Discharge Instruction: Cover with ABD pad Secured With 40M  Medipore H Soft Cloth Surgical Tape, 2x2 (in/yd) Conforming Stretch Gauze Bandage 4x75 (in/in) Discharge Instruction: Apply as directed Compression Wrap Compression Stockings Add-Ons Electronic Signature(s) Signed: 07/23/2021 1:30:21 PM By: Donnamarie Poag Entered By: Donnamarie Poag on 07/23/2021 11:45:30 Amy Moses ([id number]) -------------------------------------------------------------------------------- Wound Assessment Details Patient Name: Amy Moses Date of Service: 07/23/2021 11:00 AM Medical Record Number: [id number] Patient Account Number: [id number] Date of Birth/Sex: 1971-03-20 (49 y.o. F) Treating RN: Donnamarie Poag Primary Care Calib Wadhwa: Zenon Mayo Other Clinician: Referring Clarion Mooneyhan: Zenon Mayo Treating Saralyn Willison/Extender: Skipper Cliche in Treatment: 33 Wound Status Wound Number: 4 Primary Pressure Ulcer Etiology: Wound Location: Right Calcaneus Wound Open Wounding Event: Gradually Appeared Status: Date Acquired: 10/21/2020 Comorbid Cataracts, Glaucoma, Anemia, Hypertension, Type II Weeks Of Treatment: 33 History: Diabetes, End Stage Renal Disease, Neuropathy Clustered Wound: No Photos Photo Uploaded ByDonnamarie Poag on 07/23/2021 13:06:46 Wound Measurements Length: (cm) 4.2 % Reduct Width: (cm) 3 % Reduct Depth: (cm) 0.1 Epitheli Area: (cm) 9.896 Tunneli Volume: (cm) 0.99 Undermi ion in Area: 65% ion in Volume: 65% alization: Small (1-33%) ng: No ning: No Wound Description Classification: Unstageable/Unclassified Foul Od Exudate Amount: Large Slough/ Exudate Type: Serosanguineous Exudate Color: red, brown or After Cleansing: No Fibrino Yes Wound Bed Granulation Amount: Large (67-100%) Exposed Structure Granulation Quality: Red Fascia Exposed: No Necrotic Amount: Small (1-33%) Fat Layer (Subcutaneous Tissue) Exposed: Yes Necrotic Quality: Adherent Slough Tendon Exposed: No Muscle Exposed: No Joint Exposed: No Bone Exposed:  No Treatment Notes Wound #4 (Calcaneus) Wound Laterality: Right Cleanser Peri-Wound Care Topical YETUNDE, LEIS ([id number]) Primary Dressing Hydrofera Blue Ready Transfer Foam, 4x5 (in/in) Discharge Instruction: Apply Hydrofera Blue Ready to wound bed as directed Secondary Dressing ABD Pad 5x9 (in/in) Discharge Instruction: Cover with ABD pad Secured With 40M Medipore H Soft Cloth Surgical Tape, 2x2 (in/yd) Conforming Stretch Gauze Bandage 4x75 (in/in) Discharge Instruction: Apply as directed Compression Wrap Compression Stockings Add-Ons Electronic Signature(s) Signed: 07/23/2021 1:30:21 PM By: Donnamarie Poag Entered By: Donnamarie Poag on 07/23/2021 11:48:05 Amy Moses ([id number]) -------------------------------------------------------------------------------- Wound Assessment Details Patient Name: Amy Moses Date of Service: 07/23/2021 11:00 AM Medical Record Number: [id number] Patient Account Number: [id number] Date of Birth/Sex: 12/09/1970 (49 y.o. F) Treating RN: Donnamarie Poag Primary Care Raywood Wailes: Zenon Mayo Other Clinician: Referring Taven Strite: Zenon Mayo Treating Avry Roedl/Extender: Skipper Cliche in Treatment: 33 Wound Status Wound Number: 9 Primary Calciphylaxis Etiology: Wound Location: Left, Lateral Calf Wound Open Wounding Event: Gradually Appeared Status: Date Acquired: 02/16/2021 Comorbid Cataracts, Glaucoma, Anemia, Hypertension, Type II Weeks Of Treatment: 22 History: Diabetes, End Stage Renal Disease, Neuropathy Clustered Wound: No Photos Photo Uploaded ByDonnamarie Poag on 07/23/2021 13:06:46 Wound Measurements Length: (cm) 15.1 Width: (cm) 11.5 Depth: (cm) 3.5 Area: (cm) 136.384 Volume: (cm) 477.345 % Reduction in Area: -1873.1% % Reduction in Volume: -68980.3% Epithelialization: None Tunneling: No Undermining: No Wound Description Classification: Full Thickness With Exposed Support Structure Exudate Amount: Large Exudate  Type: Serosanguineous Exudate Color: red, brown s Foul Odor After Cleansing: Yes Due to Product Use: No Slough/Fibrino Yes Wound Bed Granulation Amount: Small (1-33%) Exposed Structure Granulation Quality: Red Fascia Exposed: Yes Necrotic Amount: Large (67-100%) Fat Layer (Subcutaneous  Tissue) Exposed: Yes Necrotic Quality: Eschar, Adherent Slough Tendon Exposed: Yes Muscle Exposed: No Joint Exposed: No Bone Exposed: No Treatment Notes Wound #9 (Calf) Wound Laterality: Left, Lateral Cleanser Dakin 16 (oz) 0.25 Discharge Instruction: Use as directed. Peri-Wound Care SHARUNDA, SALMON ([id number]) Topical Primary Dressing Hydrofera Blue Classic Foam Rope Dressing, 9x6 (mm/in) Secondary Dressing ABD Pad 5x9 (in/in) Discharge Instruction: Cover with ABD pad Secured With 7M Medipore H Soft Cloth Surgical Tape, 2x2 (in/yd) Conforming Stretch Gauze Bandage 4x75 (in/in) Discharge Instruction: Apply as directed Compression Wrap Compression Stockings Add-Ons Electronic Signature(s) Signed: 07/23/2021 1:30:21 PM By: Donnamarie Poag Entered By: Donnamarie Poag on 07/23/2021 11:47:37 Amy Moses ([id number]) -------------------------------------------------------------------------------- Vitals Details Patient Name: Amy Moses Date of Service: 07/23/2021 11:00 AM Medical Record Number: [id number] Patient Account Number: [id number] Date of Birth/Sex: November 06, 1971 (50 y.o. F) Treating RN: Donnamarie Poag Primary Care Sheena Donegan: Zenon Mayo Other Clinician: Referring Berkeley Veldman: Zenon Mayo Treating Aaleeyah Bias/Extender: Skipper Cliche in Treatment: 33 Vital Signs Time Taken: 11:34 Temperature (F): 98.2 Height (in): 66 Pulse (bpm): 89 Weight (lbs): 235 Respiratory Rate (breaths/min): 16 Body Mass Index (BMI): 37.9 Blood Pressure (mmHg): 154/80 Reference Range: 80 - 120 mg / dl Electronic Signature(s) Signed: 07/23/2021 1:30:21 PM By: Donnamarie Poag Entered ByDonnamarie Poag on  07/23/2021 11:35:56

## 2021-07-23 NOTE — Progress Notes (Addendum)
LAWONDA, LUBERTO ([id number]) Visit Report for 07/23/2021 Chief Complaint Document Details Patient Name: Amy Moses, Amy Moses Date of Service: 07/23/2021 11:00 AM Medical Record Number: [id number] Patient Account Number: [id number] Date of Birth/Sex: 04-Feb-1971 (50 y.o. F) Treating RN: Donnamarie Poag Primary Care Provider: Zenon Mayo Other Clinician: Referring Provider: Zenon Mayo Treating Provider/Extender: Skipper Cliche in Treatment: 33 Information Obtained from: Patient Chief Complaint 12/02/2020; patient is here for review of extensive wounds on her bilateral thighs as well as an area on her right plantar heel Electronic Signature(s) Signed: 07/23/2021 11:32:34 AM By: Worthy Keeler PA-C Entered By: Worthy Keeler on 07/23/2021 11:32:34 Amy Moses ([id number]) -------------------------------------------------------------------------------- HPI Details Patient Name: Amy Moses Date of Service: 07/23/2021 11:00 AM Medical Record Number: [id number] Patient Account Number: [id number] Date of Birth/Sex: Jan 17, 1971 (49 y.o. F) Treating RN: Donnamarie Poag Primary Care Provider: Zenon Mayo Other Clinician: Referring Provider: Zenon Mayo Treating Provider/Extender: Skipper Cliche in Treatment: 4 History of Present Illness HPI Description: ADMISSION 12/02/2020 This is a 50 year old woman who is a type II diabetic. Although there are mentions of type 1 diabetes in epic clearly this woman is type II based on the fact that she was on oral agents for 10 years before starting insulin. She also is in chronic renal failure and recently started on dialysis I think in November. She relates her problems starting in September she started to develop skin excoriation on her bilateral inner thighs. She thought this was a friction phenomenon however the tissue wrist can progressively broke down. She was seen by her primary doctor on 10/21/2020 noted to have erythema on both thighs  medially and a blister. MRIs were ordered and she was put on Silvadene and gauze. She was seen in the ER on 12/12 at the [place] [facility] also noted to have a right unstageable heel wound. 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 She dialyzes at [place] in [facility] and she was started on sodium thiosulfate as far as the notes state. The patient states she gets something at dialysis every day although she has not exactly sure what they are giving her. She was noted by vein and vascular in [place] to have multiple open wounds on 11/19/2020 using Xeroform gauze. She had an angiogram booked by Dr. Oneida Alar predominantly I think because of the right heel ulcer although this was canceled by the patient because of the weather. The patient has large necrotic wounds on both inner thighs with covering black eschar. There is also an area on the left lateral thigh and an eschared area on her right plantar heel. She has been using Betadine to the right heel Xeroform to the areas on her legs. The patient has Medicaid but is able to get the Xeroform, were not really sure how she is managing this although she lives in [place] and there may be different rules for Medicaid in [place] versus [place]. We certainly would not be able to get that here. 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 I wondered whether her neuropathy is particularly affected her sensation of pain since her recent left patella fracture does not seem to have been that painful either Past medical history includes stage V chronic renal failure starting on dialysis I think in November, clearly type 2 diabetes with neuropathy, hypertension, glaucoma, vitamin D deficiency, history of cholecystectomy, edema of both legs, hypothyroidism, she dialyzes Tuesday  Thursday and Saturday at [facility] in  [place] The patient had arterial studies on 11/19/2020 I think at vein and vascular in [place]. She had biphasic waveforms throughout the thigh on the right and at the popliteal proximally and distally in the ATA but the PTA and peroneal were not visualized. On the left again biphasic waveforms up to the level of the distal popliteal but not visualized in the tibial vessels. She was felt to have adequate flow where visualized. As noted she was supposed to have an angiogram which I think is certainly indicated 01/21/3556 upon evaluation today patient actually appears to be doing okay in regard to her wounds. This is actually the first time of seeing her she saw Dr. Dellia Nims at the last visit she does have quite an extensive history based on review. With that being said I do not see any signs right now of active infection which is great news. Overall I am extremely pleased with where things stand in that regard. 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 I really feel like she may do better with Dakin's moistened gauze. I do think that we can see about ordering the Dakin's for her she is already using gauze and then wrapping with roll gauze anyway so really would not change much except for moistening some gauze and applying it to the wound bed. With that being said I do not see any signs of active infection at this time which is great news. The patient all in all seems to be in fairly good spirits all things considered. 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 Fortunately it looks like some of the eschar is loosening up so we should be able to get this removed which hopefully will help in speeding up the healing process. With that being said with regard to the patient's  gluteal region she unfortunately has new pressure ulcerations open today which I think could benefit from possibly having a air mattress. With that being said I explained to the patient that unfortunately this could still take some time as far as getting it to heal completely. There does not appear to be any signs of active infection at this time which is great news. No fevers, chills, nausea, vomiting, or diarrhea. 02/16/2021 upon evaluation today patient appears to be doing about the same in regard to her heel. Her gluteal area is actually getting better. She did have surgery in regard to her thighs bilaterally as well as her knee. It was not until she got into the operating room that it was noted that she had the wounds on the thighs. Subsequently according to the surgeons note dated 02/01/2021 they contacted the patient's daughter in order to discuss the fact they felt she needed to have the wound surgically debrided and a wound VAC placed and subsequently instead of patellar reconstruction they opted to proceed with removal of the Portion of the patella in order to get this area to heal more effectively and quickly. Overall that seems to have done quite well. 02/26/2021 upon evaluation today patient's wounds actually appear to be doing excellent pretty much across the board I am very pleased. With that being said the wounds where she has the wound vacs placed on the thighs in particular seems to be doing a very  good job as far as healing is concerned. There does not appear to be any evidence of infection which is great news and overall I am extremely pleased in that regard. No fevers, chills, nausea, vomiting, or diarrhea. 03/12/21 upon evaluation today patient appears to be doing well with regard to her wounds. I think she is doing excellent with the wound VAC and very pleased in that regard. Fortunately there does not appear to be any signs of infection I am that a try to do a little bit of light  debridement today due to some necrotic regions noted in her wounds to try to help speed up the healing process here. 04/05/2021 upon evaluation today patient appears to be doing decently well in general in regard to her wounds. She was unfortunately in the hospital from the 10th through the 12th of this month due to having high fever and subsequently they did not actually determine exactly what was going on they thought she might of just been sick or had some kind of a cold. With that being said fortunately there does not appear to be any evidence of active infection at this time which is great news. No fevers, chills, nausea, vomiting, or diarrhea. ASPEN, LAWRANCE ([id number]) 05/03/2021 upon evaluation today patient actually appears to be doing excellent for the most part in regard to the wounds on her thighs. I am extremely happy with what I see today. In fact I feel like it is probably time for [name] to discontinue the use of the wound VAC as this appears to be doing so well. The patient voiced an understanding and agreement and is definitely happy to get rid of the wound VAC. She was recently in the hospital but this was secondary to something unrelated to her wound she had a polyp which was bleeding and had to be taken care of this was causing significant issues she has since been taken off of her aspirin. 05/21/2021 upon evaluation today patient appears to be doing well with regard to her wound. She has been tolerating the dressing changes without complication with regard to the [facility] in the upper thigh region. We have been using Dakin's moistened gauze on the legs and the heel. Fortunately everything seems to be making good progress here in general which is great news. The unfortunate thing is that we have been having issues here with some necrotic tissue and drainage on the lateral portions of her legs. Fortunately there does not appear to be any signs of systemic infection although  locally there may be a little bit of infection here based on the drainage which is somewhat blue-green in nature. I am going to perform a culture postdebridement of the right calf/lower extremity region. She tolerated that today without complication. I did perform that culture as well. 06/04/2021 upon evaluation today patient appears to be doing well with regard to the wounds on her thighs in general. Fortunately there does not appear to be any signs of active infection which is great news I am very pleased in that regard. In regard to her lower extremities I do feel like that she is making some good progress here as well. This includes the use of the Dakin's moistened gauze which has done a great job as far as keeping the wounds clean at this point. Overall I am extremely happy with where we stand. 06/18/2021 upon evaluation today patient appears to be doing a little better in regard to most of her wounds in general. She  does have a couple areas again and needs some debridement as far as the left lateral leg and the right heel are concerned. Otherwise she also has a small area of fluid on the right medial thigh where this is mostly healed but nonetheless there is a small opening that is of concern here. Fortunately there is no evidence of active infection at this time. No fevers, chills, nausea, vomiting, or diarrhea. 07/08/2021 upon evaluation today patient appears to be doing quite well in regard to her wounds on the right thigh region and left medial thigh region. That is about the extent of what seems to be doing good though everything else is at least the same if not a little bit worse unfortunately. Currently that includes the right lateral thigh where she has pus draining from this area we are probably need to culture this region. There is also some depth to the area where this is draining from. She also has significant wounds on the calf region right and left of the left especially starting to  migrate and looking like it is getting worse. We been using Dakin's on this area. Her heel seems to be doing about the same there is really no significant improvement in general. 07/23/2021 upon evaluation today patient actually appears to be doing quite well in regard to her wounds in general. Most everything is actually showing signs of significant improvement. She did see plastic surgery, Dr. Claudia Desanctis yesterday. With that being said it appears based on what the patient is telling me that he really did not feel there was anything from a surgical standpoint that he had to offer as far as treatment was concerned. Again this was part of the reason that I sent her in order to determine whether or not there was anything that could be done or not. With that being said he did mention [facility] to see if there is anything they had to offer but in general it sounds like that is probably not going to be the case. With all that being said the one area that we have an issue is on the left lateral thigh where unfortunately she is having a significant amount of purulent drainage there still some depth here I think we need to pack this area Hydrofera Blue rope will probably do well for her. Electronic Signature(s) Signed: 07/23/2021 3:06:35 PM By: Worthy Keeler PA-C Entered By: Worthy Keeler on 07/23/2021 15:06:35 Amy Moses ([id number]) -------------------------------------------------------------------------------- Physical Exam Details Patient Name: Amy Moses Date of Service: 07/23/2021 11:00 AM Medical Record Number: [id number] Patient Account Number: [id number] Date of Birth/Sex: 04-26-1971 (49 y.o. F) Treating RN: Donnamarie Poag Primary Care Provider: Zenon Mayo Other Clinician: Referring Provider: Zenon Mayo Treating Provider/Extender: Skipper Cliche in Treatment: 77 Constitutional Well-nourished and well-hydrated in no acute  distress. Respiratory normal breathing without difficulty. Psychiatric this patient is able to make decisions and demonstrates good insight into disease process. Alert and Oriented x 3. pleasant and cooperative. Notes Upon inspection patient's wound General surgery signs of improvement the left calf region is the worst where there is tendon and even necrotic tendon noted here the Dakin's does seem to be helping slowly but surely. Again there seems to be no plastic surgery option currently. Everything else is doing fairly well except for the left lateral thigh where she does have some deeper area here that I think Hydrofera Blue would be beneficial for her as far as the rope is  concerned packing into the area. Everything in the gluteal region is doing much better. Electronic Signature(s) Signed: 07/23/2021 3:07:11 PM By: Worthy Keeler PA-C Entered By: Worthy Keeler on 07/23/2021 15:07:10 Amy Moses ([id number]) -------------------------------------------------------------------------------- Physician Orders Details Patient Name: Amy Moses Date of Service: 07/23/2021 11:00 AM Medical Record Number: [id number] Patient Account Number: [id number] Date of Birth/Sex: Apr 14, 1971 (49 y.o. F) Treating RN: Donnamarie Poag Primary Care Provider: Zenon Mayo Other Clinician: Referring Provider: Zenon Mayo Treating Provider/Extender: Skipper Cliche in Treatment: 64 Verbal / Phone Orders: No Diagnosis Coding ICD-10 Coding Code Description E83.59 Other disorders of calcium metabolism L97.118 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 L97.128 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 L89.610 Pressure ulcer of right heel, unstageable E11.42 Type 2 diabetes mellitus with diabetic polyneuropathy L89.313 Pressure ulcer of right buttock, stage 3 L89.323 Pressure ulcer of left buttock, stage 3 L97.822 Non-pressure chronic ulcer  of other part of left lower leg with fat layer exposed L97.812 Non-pressure chronic ulcer of other part of right lower leg with fat layer exposed Follow-up Appointments o Return Appointment in 2 weeks. [place] Not Available Off-Loading o Gel wheelchair cushion o Air fluidized (Group 3) - stay in the bed except for meals. o Turn and reposition every 2 hours - Recommend PREVALON BOOTS WHEN IN BED FOR OFFLOADING PRESSURE OFF LEG WOUNDS o Other: - keep pressure off of wounded areas. Wound Treatment Wound #11 - Lower Leg Wound Laterality: Right, Posterior, Distal Cleanser: Dakin 16 (oz) 0.25 1 x Per Day/30 Days Discharge Instructions: Use as directed. Primary Dressing: Gauze 1 x Per Day/30 Days Discharge Instructions: As directed: dry, moistened with saline or moistened with Dakins Solution Secondary Dressing: ABD Pad 5x9 (in/in) 1 x Per Day/30 Days Discharge Instructions: Cover with ABD pad Secured With: 72M Medipore H Soft Cloth Surgical Tape, 2x2 (in/yd) 1 x Per Day/30 Days Secured With: Conforming Stretch Gauze Bandage 4x75 (in/in) 1 x Per Day/30 Days Discharge Instructions: Apply as directed Wound #14 - Gluteus Wound Laterality: Left Cleanser: Soap and Water 3 x Per Week/30 Days Discharge Instructions: Gently cleanse wound with antibacterial soap, rinse and pat dry prior to dressing wounds Primary Dressing: Hydrofera Blue Ready Transfer Foam, 2.5x2.5 (in/in) 3 x Per Week/30 Days Discharge Instructions: Apply Hydrofera Blue Ready to wound bed as directed Secondary Dressing: Zetuvit Plus Silicone Border Dressing 4x4 (in/in) 3 x Per Week/30 Days Wound #15 - Gluteal fold Wound Laterality: Left Cleanser: Soap and Water 3 x Per Week/30 Days GABBRIELLA, STROMGREN ([id number]) Discharge Instructions: Gently cleanse wound with antibacterial soap, rinse and pat dry prior to dressing wounds Primary Dressing: Hydrofera Blue Ready  Transfer Foam, 2.5x2.5 (in/in) 3 x Per Week/30 Days Discharge Instructions: Apply Hydrofera Blue Ready to wound bed as directed Secondary Dressing: Zetuvit Plus Silicone Border Dressing 4x4 (in/in) 3 x Per Week/30 Days Wound #16 - Foot Wound Laterality: Plantar, Left, Distal Cleanser: Soap and Water 3 x Per Week/30 Days Discharge Instructions: Gently cleanse wound with antibacterial soap, rinse and pat dry prior to dressing wounds Primary Dressing: Hydrofera Blue Ready Transfer Foam, 2.5x2.5 (in/in) 3 x Per Week/30 Days Discharge Instructions: Apply Hydrofera Blue Ready to wound bed as directed Secondary Dressing: ABD Pad 5x9 (in/in) 3 x Per Week/30 Days Discharge Instructions: Cover with ABD pad Secured With: 72M Medipore H Soft Cloth Surgical Tape, 2x2 (in/yd) 3 x Per Week/30 Days Secured With: [employer] or  Non-Sterile 6-ply 4.5x4 (yd/yd) 3 x Per Week/30 Days Discharge Instructions: Apply Kerlix as directed Wound #2 - Upper Leg Wound Laterality: Left, Medial Cleanser: Normal Saline 3 x Per Week/30 Days Discharge Instructions: Wash your hands with soap and water. Remove old dressing, discard into plastic bag and place into trash. Cleanse the wound with Normal Saline prior to applying a clean dressing using gauze sponges, not tissues or cotton balls. Do not scrub or use excessive force. Pat dry using gauze sponges, not tissue or cotton balls. Primary Dressing: Hydrofera Blue Ready Transfer Foam, 2.5x2.5 (in/in) 3 x Per Week/30 Days Discharge Instructions: Apply Hydrofera Blue Ready to wound bed as directed Secondary Dressing: ABD Pad 5x9 (in/in) 3 x Per Week/30 Days Discharge Instructions: Cover with ABD pad Secured With: 67M Medipore H Soft Cloth Surgical Tape, 2x2 (in/yd) 3 x Per Week/30 Days Wound #3 - Upper Leg Wound Laterality: Left, Lateral Cleanser: Normal Saline 3 x Per Week/30 Days Discharge Instructions: Wash your hands with soap and water. Remove old dressing, discard into  plastic bag and place into trash. Cleanse the wound with Normal Saline prior to applying a clean dressing using gauze sponges, not tissues or cotton balls. Do not scrub or use excessive force. Pat dry using gauze sponges, not tissue or cotton balls. Primary Dressing: Hydrofera Blue Classic Foam Rope Dressing, 9x6 (mm/in) 3 x Per Week/30 Days Secondary Dressing: ABD Pad 5x9 (in/in) 3 x Per Week/30 Days Discharge Instructions: Cover with ABD pad Secured With: 67M Medipore H Soft Cloth Surgical Tape, 2x2 (in/yd) 3 x Per Week/30 Days Secured With: Conforming Stretch Gauze Bandage 4x75 (in/in) 3 x Per Week/30 Days Discharge Instructions: Apply as directed Wound #4 - Calcaneus Wound Laterality: Right Primary Dressing: Hydrofera Blue Ready Transfer Foam, 4x5 (in/in) 1 x Per Day/30 Days Discharge Instructions: Apply Hydrofera Blue Ready to wound bed as directed Secondary Dressing: ABD Pad 5x9 (in/in) 1 x Per Day/30 Days Discharge Instructions: Cover with ABD pad Secured With: 67M Medipore H Soft Cloth Surgical Tape, 2x2 (in/yd) 1 x Per Day/30 Days Secured With: Conforming Stretch Gauze Bandage 4x75 (in/in) 1 x Per Day/30 Days Discharge Instructions: Apply as directed Wound #9 - Calf Wound Laterality: Left, Lateral Lamarche, Tameah ([id number]) Cleanser: Dakin 16 (oz) 0.25 1 x Per Day/30 Days Discharge Instructions: Use as directed. Primary Dressing: Hydrofera Blue Classic Foam Rope Dressing, 9x6 (mm/in) 1 x Per Day/30 Days Secondary Dressing: ABD Pad 5x9 (in/in) 1 x Per Day/30 Days Discharge Instructions: Cover with ABD pad Secured With: 67M Medipore H Soft Cloth Surgical Tape, 2x2 (in/yd) 1 x Per Day/30 Days Secured With: Conforming Stretch Gauze Bandage 4x75 (in/in) 1 x Per Day/30 Days Discharge Instructions: Apply as directed Electronic Signature(s) Signed: 07/23/2021 1:30:21 PM By: Donnamarie Poag Signed: 07/23/2021 4:48:27 PM By: Worthy Keeler PA-C Entered By: Donnamarie Poag on 07/23/2021  12:13:26 Amy Moses ([id number]) -------------------------------------------------------------------------------- Problem List Details Patient Name: Amy Moses Date of Service: 07/23/2021 11:00 AM Medical Record Number: [id number] Patient Account Number: [id number] Date of Birth/Sex: [birth year] (50 y.o. F) Treating RN: Donnamarie Poag Primary Care Provider: Zenon Mayo Other Clinician: Referring Provider: Zenon Mayo Treating Provider/Extender: Skipper Cliche in Treatment: 69 Active Problems ICD-10 Encounter Code Description Active Date MDM Diagnosis E83.59 Other disorders of calcium metabolism 12/02/2020 No Yes L97.118 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 12/02/2020 No Yes L97.128 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 12/02/2020 No Yes L89.610 Pressure ulcer of right heel, unstageable 12/02/2020 No  Yes E11.42 Type 2 diabetes mellitus with diabetic polyneuropathy 12/02/2020 No Yes L89.313 Pressure ulcer of right buttock, stage 3 01/19/2021 No Yes L89.323 Pressure ulcer of left buttock, stage 3 01/19/2021 No Yes L97.822 Non-pressure chronic ulcer of other part of left lower leg with fat layer 05/03/2021 No Yes exposed L97.812 Non-pressure chronic ulcer of other part of right lower leg with fat layer 05/03/2021 No Yes exposed Inactive Problems Resolved Problems Electronic Signature(s) Signed: 07/23/2021 11:32:25 AM By: Worthy Keeler PA-C Entered By: Worthy Keeler on 07/23/2021 11:32:25 Amy Moses ([id number]) -------------------------------------------------------------------------------- Progress Note Details Patient Name: Amy Moses Date of Service: 07/23/2021 11:00 AM Medical Record Number: [id number] Patient Account Number: [id number] Date of Birth/Sex: [birth year] (49 y.o. F) Treating RN: Donnamarie Poag Primary Care Provider: Zenon Mayo Other Clinician: Referring Provider: Zenon Mayo Treating Provider/Extender: Skipper Cliche in Treatment: 38 Subjective Chief Complaint Information obtained from Patient 12/02/2020; patient is here for review of extensive wounds on her bilateral thighs as well as an area on her right plantar heel History of Present Illness (HPI) ADMISSION 12/02/2020 This is a 50 year old woman who is a type II diabetic. Although there are mentions of type 1 diabetes in epic clearly this woman is type II based on the fact that she was on oral agents for 10 years before starting insulin. She also is in chronic renal failure and recently started on dialysis I think in November. She relates her problems starting in September she started to develop skin excoriation on her bilateral inner thighs. She thought this was a friction phenomenon however the tissue wrist can progressively broke down. She was seen by her primary doctor on 10/21/2020 noted to have erythema on both thighs medially and a blister. MRIs were ordered and she was put on Silvadene and gauze. She was seen in the ER on 12/12 at the [place] [facility] also noted to have a right unstageable heel wound. 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 She dialyzes at [facility] in [facility] and she was started on sodium thiosulfate as far as the notes state. The patient states she gets something at dialysis every day although she has not exactly sure what they are giving her. She was noted by vein and vascular in [place] to have multiple open wounds on 11/19/2020 using Xeroform gauze. She had an angiogram booked by Dr. Oneida Alar predominantly I think because of the right heel ulcer although this was canceled by the patient because of the weather. The patient has large necrotic wounds on both inner thighs with covering black eschar. There is also an area on the left lateral thigh and an eschared area on her right plantar heel. She has been using  Betadine to the right heel Xeroform to the areas on her legs. The patient has Medicaid but is able to get the Xeroform, were not really sure how she is managing this although she lives in [place] and there may be different rules for Medicaid in [place] versus [place]. We certainly would not be able to get that here. 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 I wondered whether her neuropathy is particularly affected her sensation of pain since her recent left patella fracture does not seem to have been that painful either Past medical history includes stage V chronic renal failure starting on dialysis I think in November, clearly type 2 diabetes with  neuropathy, hypertension, glaucoma, vitamin D deficiency, history of cholecystectomy, edema of both legs, hypothyroidism, she dialyzes Tuesday Thursday and Saturday at [facility] in [place] The patient had arterial studies on 11/19/2020 I think at vein and vascular in [place]. She had biphasic waveforms throughout the thigh on the right and at the popliteal proximally and distally in the ATA but the PTA and peroneal were not visualized. On the left again biphasic waveforms up to the level of the distal popliteal but not visualized in the tibial vessels. She was felt to have adequate flow where visualized. As noted she was supposed to have an angiogram which I think is certainly indicated [id number] upon evaluation today patient actually appears to be doing okay in regard to her wounds. This is actually the first time of seeing her she saw Dr. Dellia Nims at the last visit she does have quite an extensive history based on review. With that being said I do not see any signs right now of active infection which is great news. Overall I am extremely pleased with where things stand in that regard. With that being said I do not believe the Xeroform is doing much for the  calciphylaxis areas on her thighs and lateral or medial locations. I really feel like she may do better with Dakin's moistened gauze. I do think that we can see about ordering the Dakin's for her she is already using gauze and then wrapping with roll gauze anyway so really would not change much except for moistening some gauze and applying it to the wound bed. With that being said I do not see any signs of active infection at this time which is great news. The patient all in all seems to be in fairly good spirits all things considered. 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 Fortunately it looks like some of the eschar is loosening up so we should be able to get this removed which hopefully will help in speeding up the healing process. With that being said with regard to the patient's gluteal region she unfortunately has new pressure ulcerations open today which I think could benefit from possibly having a air mattress. With that being said I explained to the patient that unfortunately this could still take some time as far as getting it to heal completely. There does not appear to be any signs of active infection at this time which is great news. No fevers, chills, nausea, vomiting, or diarrhea. 02/16/2021 upon evaluation today patient appears to be doing about the same in regard to her heel. Her gluteal area is actually getting better. She did have surgery in regard to her thighs bilaterally as well as her knee. It was not until she got into the operating room that it was noted that she had the wounds on the thighs. Subsequently according to the surgeons note dated 02/01/2021 they contacted the patient's daughter in order to discuss the fact they felt she needed to have the wound surgically debrided and a wound VAC placed and subsequently instead of  patellar reconstruction they opted to proceed with removal of the Portion of the patella in order to get this area to heal more effectively and quickly. Overall that seems to have done quite well. 02/26/2021 upon evaluation today patient's wounds actually appear to be doing excellent pretty much across the board I am very pleased. With that being said the wounds where  she has the wound vacs placed on the thighs in particular seems to be doing a very good job as far as healing is concerned. There does not appear to be any evidence of infection which is great news and overall I am extremely pleased in that regard. No fevers, chills, nausea, vomiting, or diarrhea. 03/12/21 upon evaluation today patient appears to be doing well with regard to her wounds. I think she is doing excellent with the wound VAC and very pleased in that regard. Fortunately there does not appear to be any signs of infection I am that a try to do a little bit of light debridement today due to some necrotic regions noted in her wounds to try to help speed up the healing process here. ROSELANI, VANSICKEL ([id number]) 04/05/2021 upon evaluation today patient appears to be doing decently well in general in regard to her wounds. She was unfortunately in the hospital from the 10th through the 12th of this month due to having high fever and subsequently they did not actually determine exactly what was going on they thought she might of just been sick or had some kind of a cold. With that being said fortunately there does not appear to be any evidence of active infection at this time which is great news. No fevers, chills, nausea, vomiting, or diarrhea. 05/03/2021 upon evaluation today patient actually appears to be doing excellent for the most part in regard to the wounds on her thighs. I am extremely happy with what I see today. In fact I feel like it is probably time for [name] to discontinue the use of the wound VAC as this appears to be doing so  well. The patient voiced an understanding and agreement and is definitely happy to get rid of the wound VAC. She was recently in the hospital but this was secondary to something unrelated to her wound she had a polyp which was bleeding and had to be taken care of this was causing significant issues she has since been taken off of her aspirin. 05/21/2021 upon evaluation today patient appears to be doing well with regard to her wound. She has been tolerating the dressing changes without complication with regard to the [facility] in the upper thigh region. We have been using Dakin's moistened gauze on the legs and the heel. Fortunately everything seems to be making good progress here in general which is great news. The unfortunate thing is that we have been having issues here with some necrotic tissue and drainage on the lateral portions of her legs. Fortunately there does not appear to be any signs of systemic infection although locally there may be a little bit of infection here based on the drainage which is somewhat blue-green in nature. I am going to perform a culture postdebridement of the right calf/lower extremity region. She tolerated that today without complication. I did perform that culture as well. 06/04/2021 upon evaluation today patient appears to be doing well with regard to the wounds on her thighs in general. Fortunately there does not appear to be any signs of active infection which is great news I am very pleased in that regard. In regard to her lower extremities I do feel like that she is making some good progress here as well. This includes the use of the Dakin's moistened gauze which has done a great job as far as keeping the wounds clean at this point. Overall I am extremely happy with where we stand. 06/18/2021 upon evaluation today patient appears  to be doing a little better in regard to most of her wounds in general. She does have a couple areas again and needs some debridement  as far as the left lateral leg and the right heel are concerned. Otherwise she also has a small area of fluid on the right medial thigh where this is mostly healed but nonetheless there is a small opening that is of concern here. Fortunately there is no evidence of active infection at this time. No fevers, chills, nausea, vomiting, or diarrhea. 07/08/2021 upon evaluation today patient appears to be doing quite well in regard to her wounds on the right thigh region and left medial thigh region. That is about the extent of what seems to be doing good though everything else is at least the same if not a little bit worse unfortunately. Currently that includes the right lateral thigh where she has pus draining from this area we are probably need to culture this region. There is also some depth to the area where this is draining from. She also has significant wounds on the calf region right and left of the left especially starting to migrate and looking like it is getting worse. We been using Dakin's on this area. Her heel seems to be doing about the same there is really no significant improvement in general. 07/23/2021 upon evaluation today patient actually appears to be doing quite well in regard to her wounds in general. Most everything is actually showing signs of significant improvement. She did see plastic surgery, Dr. Claudia Desanctis yesterday. With that being said it appears based on what the patient is telling me that he really did not feel there was anything from a surgical standpoint that he had to offer as far as treatment was concerned. Again this was part of the reason that I sent her in order to determine whether or not there was anything that could be done or not. With that being said he did mention making a [facility] in [facility] to see if there is anything they had to offer but in general it sounds like that is probably not going to be the case. With all that being said the one area  that we have an issue is on the left lateral thigh where unfortunately she is having a significant amount of purulent drainage there still some depth here I think we need to pack this area Hydrofera Blue rope will probably do well for her. Objective Constitutional Well-nourished and well-hydrated in no acute distress. Vitals Time Taken: 11:34 AM, Height: 66 in, Weight: 235 lbs, BMI: 37.9, Temperature: 98.2 F, Pulse: 89 bpm, Respiratory Rate: 16 breaths/min, Blood Pressure: 154/80 mmHg. Respiratory normal breathing without difficulty. Psychiatric this patient is able to make decisions and demonstrates good insight into disease process. Alert and Oriented x 3. pleasant and cooperative. General Notes: Upon inspection patient's wound General surgery signs of improvement the left calf region is the worst where there is tendon and even necrotic tendon noted here the Dakin's does seem to be helping slowly but surely. Again there seems to be no plastic surgery option currently. Everything else is doing fairly well except for the left lateral thigh where she does have some deeper area here that I think Hydrofera Blue would be beneficial for her as far as the rope is concerned packing into the area. Everything in the gluteal region is doing much better. Integumentary (Hair, Skin) Wound #1 status is Healed - Epithelialized. Original cause of wound was  Gradually Appeared. The date acquired was: 07/22/2020. The wound has Mejorado, August ([id number]) been in treatment 33 weeks. The wound is located on the Right,Medial Upper Leg. The wound measures 0cm length x 0cm width x 0cm depth; 0cm^2 area and 0cm^3 volume. There is a none present amount of drainage noted. Wound #10 status is Converted. Original cause of wound was Gradually Appeared. The date acquired was: 02/16/2021. The wound has been in treatment 22 weeks. The wound is located on the Right,Lateral Calf. The wound measures 0cm length x 0cm width x 0cm  depth; 0cm^2 area and 0cm^3 volume. There is a large amount of serosanguineous drainage noted. Wound #11 status is Open. Original cause of wound was Gradually Appeared. The date acquired was: 03/21/2021. The wound has been in treatment 15 weeks. The wound is located on the Right,Distal,Posterior Lower Leg. The wound measures 11.4cm length x 9cm width x 0.5cm depth; 80.582cm^2 area and 40.291cm^3 volume. There is Fat Layer (Subcutaneous Tissue) exposed. There is no tunneling or undermining noted. There is a medium amount of serosanguineous drainage noted. There is medium (34-66%) red granulation within the wound bed. There is a medium (34-66%) amount of necrotic tissue within the wound bed including Eschar and Adherent Slough. Wound #13 status is Healed - Epithelialized. Original cause of wound was Gradually Appeared. The date acquired was: 05/31/2021. The wound has been in treatment 7 weeks. The wound is located on the Right Gluteus. The wound measures 0cm length x 0cm width x 0cm depth; 0cm^2 area and 0cm^3 volume. There is no tunneling or undermining noted. There is a none present amount of drainage noted. There is no granulation within the wound bed. There is no necrotic tissue within the wound bed. Wound #14 status is Open. Original cause of wound was Gradually Appeared. The date acquired was: 05/28/2021. The wound has been in treatment 7 weeks. The wound is located on the Left Gluteus. The wound measures 5cm length x 2.5cm width x 0.2cm depth; 9.817cm^2 area and 1.963cm^3 volume. There is Fat Layer (Subcutaneous Tissue) exposed. There is no tunneling or undermining noted. There is a medium amount of serosanguineous drainage noted. There is medium (34-66%) red granulation within the wound bed. There is a medium (34-66%) amount of necrotic tissue within the wound bed including Adherent Slough. Wound #15 status is Open. Original cause of wound was Gradually Appeared. The date acquired was: 06/02/2021.  The wound has been in treatment 5 weeks. The wound is located on the Left Gluteal fold. The wound measures 3cm length x 1.5cm width x 0.2cm depth; 3.534cm^2 area and 0.707cm^3 volume. There is Fat Layer (Subcutaneous Tissue) exposed. There is no tunneling or undermining noted. There is a medium amount of serosanguineous drainage noted. There is medium (34-66%) granulation within the wound bed. There is a medium (34-66%) amount of necrotic tissue within the wound bed including Adherent Slough. Wound #16 status is Open. Original cause of wound was Pressure Injury. The date acquired was: 07/23/2021. The wound is located on the [place]. The wound measures 2.5cm length x 3cm width x 0.1cm depth; 5.89cm^2 area and 0.589cm^3 volume. There is no tunneling or undermining noted. There is a none present amount of drainage noted. There is no granulation within the wound bed. There is a large (67-100%) amount of necrotic tissue within the wound bed including Eschar. Wound #2 status is Open. Original cause of wound was Gradually Appeared. The date acquired was: 07/22/2020. The wound has been in treatment 33 weeks. The wound  is located on the Left,Medial Upper Leg. The wound measures 1cm length x 1cm width x 0.1cm depth; 0.785cm^2 area and 0.079cm^3 volume. There is Fat Layer (Subcutaneous Tissue) exposed. There is no tunneling or undermining noted. There is a medium amount of serosanguineous drainage noted. There is large (67-100%) granulation within the wound bed. There is a small (1-33%) amount of necrotic tissue within the wound bed including Adherent Slough. Wound #3 status is Open. Original cause of wound was Gradually Appeared. The date acquired was: 07/22/2020. The wound has been in treatment 33 weeks. The wound is located on the Left,Lateral Upper Leg. The wound measures 4cm length x 8cm width x 0.1cm depth; 25.133cm^2 area and 2.513cm^3 volume. There is Fat Layer (Subcutaneous Tissue) exposed.  There is no tunneling or undermining noted. There is a large amount of purulent drainage noted. Foul odor after cleansing was noted. There is large (67-100%) granulation within the wound bed. There is no necrotic tissue within the wound bed. Wound #4 status is Open. Original cause of wound was Gradually Appeared. The date acquired was: 10/21/2020. The wound has been in treatment 33 weeks. The wound is located on the Right Calcaneus. The wound measures 4.2cm length x 3cm width x 0.1cm depth; 9.896cm^2 area and 0.99cm^3 volume. There is Fat Layer (Subcutaneous Tissue) exposed. There is no tunneling or undermining noted. There is a large amount of serosanguineous drainage noted. There is large (67-100%) red granulation within the wound bed. There is a small (1-33%) amount of necrotic tissue within the wound bed including Adherent Slough. Wound #9 status is Open. Original cause of wound was Gradually Appeared. The date acquired was: 02/16/2021. The wound has been in treatment 22 weeks. The wound is located on the Left,Lateral Calf. The wound measures 15.1cm length x 11.5cm width x 3.5cm depth; 136.384cm^2 area and 477.345cm^3 volume. There is tendon, Fat Layer (Subcutaneous Tissue), and fascia exposed. There is no tunneling or undermining noted. There is a large amount of serosanguineous drainage noted. Foul odor after cleansing was noted. There is small (1-33%) red granulation within the wound bed. There is a large (67-100%) amount of necrotic tissue within the wound bed including Eschar and Adherent Slough. 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 Home Health: Other Home Health Orders/Instructions: - Home Health Not Available Off-Loading: Gel wheelchair cushion Air fluidized (Group 3) - stay in the bed except for meals. Turn and reposition every 2 hours - Recommend PREVALON BOOTS WHEN IN BED FOR OFFLOADING PRESSURE OFF LEG WOUNDS Other: - keep pressure off of wounded areas. WOUND #11: - Lower Leg Wound Laterality: Right, Posterior, Distal Cleanser: Dakin 16 (oz) 0.25 1 x Per Day/30 Days Discharge Instructions: Use as directed. Primary Dressing: Gauze 1 x Per Day/30 Days Discharge Instructions: As directed: dry, moistened with saline or moistened with Dakins Solution Secondary Dressing: ABD Pad 5x9 (in/in) 1 x Per Day/30 Days Discharge Instructions: Cover with ABD pad Secured With: 99M Medipore H Soft Cloth Surgical Tape, 2x2 (in/yd)  1 x Per Day/30 Days Secured With: Conforming Stretch Gauze Bandage 4x75 (in/in) 1 x Per Day/30 Days Discharge Instructions: Apply as directed WOUND #14: - Gluteus Wound Laterality: Left Cleanser: Soap and Water 3 x Per Week/30 Days Discharge Instructions: Gently cleanse wound with antibacterial soap, rinse and pat dry prior to dressing wounds Primary Dressing: Hydrofera Blue Ready Transfer Foam, 2.5x2.5 (in/in) 3 x Per Week/30 Days Discharge Instructions: Apply Hydrofera Blue Ready to wound bed as directed Secondary Dressing: Zetuvit Plus Silicone Border Dressing 4x4 (in/in) 3 x Per Week/30 Days WOUND #15: - Gluteal fold Wound Laterality: Left Cleanser: Soap and Water 3 x Per Week/30 Days Discharge Instructions: Gently cleanse wound with antibacterial soap, rinse and pat dry prior to dressing wounds Primary Dressing: Hydrofera Blue Ready  Transfer Foam, 2.5x2.5 (in/in) 3 x Per Week/30 Days Discharge Instructions: Apply Hydrofera Blue Ready to wound bed as directed Secondary Dressing: Zetuvit Plus Silicone Border Dressing 4x4 (in/in) 3 x Per Week/30 Days WOUND #16: - Foot Wound Laterality: Plantar, Left, Distal Cleanser: Soap and Water 3 x Per Week/30 Days Discharge Instructions: Gently cleanse wound with antibacterial soap, rinse and pat dry prior to dressing wounds Primary Dressing: Hydrofera Blue Ready Transfer Foam, 2.5x2.5 (in/in) 3 x Per Week/30 Days Discharge Instructions: Apply Hydrofera Blue Ready to wound bed as directed Secondary Dressing: ABD Pad 5x9 (in/in) 3 x Per Week/30 Days Discharge Instructions: Cover with ABD pad Secured With: 69M Medipore H Soft Cloth Surgical Tape, 2x2 (in/yd) 3 x Per Week/30 Days Secured With: [employer] Sterile or Non-Sterile 6-ply 4.5x4 (yd/yd) 3 x Per Week/30 Days Discharge Instructions: Apply Kerlix as directed WOUND #2: - Upper Leg Wound Laterality: Left, Medial Cleanser: Normal Saline 3 x Per Week/30 Days Discharge Instructions: Wash your hands with soap and water. Remove old dressing, discard into plastic bag and place into trash. Cleanse the wound with Normal Saline prior to applying a clean dressing using gauze sponges, not tissues or cotton balls. Do not scrub or use excessive force. Pat dry using gauze sponges, not tissue or cotton balls. Primary Dressing: Hydrofera Blue Ready Transfer Foam, 2.5x2.5 (in/in) 3 x Per Week/30 Days Discharge Instructions: Apply Hydrofera Blue Ready to wound bed as directed Secondary Dressing: ABD Pad 5x9 (in/in) 3 x Per Week/30 Days Discharge Instructions: Cover with ABD pad Secured With: 69M Medipore H Soft Cloth Surgical Tape, 2x2 (in/yd) 3 x Per Week/30 Days WOUND #3: - Upper Leg Wound Laterality: Left, Lateral Cleanser: Normal Saline 3 x Per Week/30 Days Discharge Instructions: Wash your hands with soap and water. Remove old dressing,  discard into plastic bag and place into trash. Cleanse the wound with Normal Saline prior to applying a clean dressing using gauze sponges, not tissues or cotton balls. Do not scrub or use excessive force. Pat dry using gauze sponges, not tissue or cotton balls. Primary Dressing: Hydrofera Blue Classic Foam Rope Dressing, 9x6 (mm/in) 3 x Per Week/30 Days Secondary Dressing: ABD Pad 5x9 (in/in) 3 x Per Week/30 Days Discharge Instructions: Cover with ABD pad Secured With: 69M Medipore H Soft Cloth Surgical Tape, 2x2 (in/yd) 3 x Per Week/30 Days Secured With: Conforming Stretch Gauze Bandage 4x75 (in/in) 3 x Per Week/30 Days Discharge Instructions: Apply as directed WOUND #4: - Calcaneus Wound Laterality: Right Primary Dressing: Hydrofera Blue Ready Transfer Foam, 4x5 (in/in) 1 x Per Day/30 Days Discharge Instructions: Apply Hydrofera Blue Ready to wound bed as directed Secondary Dressing: ABD Pad 5x9 (in/in) 1 x Per Day/30 Days Discharge Instructions: Cover with  ABD pad Secured With: 58M Medipore H Soft Cloth Surgical Tape, 2x2 (in/yd) 1 x Per Day/30 Days Secured With: Conforming Stretch Gauze Bandage 4x75 (in/in) 1 x Per Day/30 Days Discharge Instructions: Apply as directed WOUND #9: - Calf Wound Laterality: Left, Lateral Cleanser: Dakin 16 (oz) 0.25 1 x Per Day/30 Days Discharge Instructions: Use as directed. URVI, BLOCKER ([id number]) Primary Dressing: Hydrofera Blue Classic Foam Rope Dressing, 9x6 (mm/in) 1 x Per Day/30 Days Secondary Dressing: ABD Pad 5x9 (in/in) 1 x Per Day/30 Days Discharge Instructions: Cover with ABD pad Secured With: 58M Medipore H Soft Cloth Surgical Tape, 2x2 (in/yd) 1 x Per Day/30 Days Secured With: Conforming Stretch Gauze Bandage 4x75 (in/in) 1 x Per Day/30 Days Discharge Instructions: Apply as directed 1. Would recommend currently that we based on what we are seeing currently go ahead and continue with wound care measures as before. We are using Hydrofera  Blue to most wounds on the gluteal region as well as on the thighs. Hydrofera Blue rope on the left thigh. 2. Were using Betadine to the heel locations. 3. I am also can recommend that we continue with the Dakin's moistened gauze to the calf regions both left and right I think that is doing a good job as well. We will see patient back for reevaluation in 2 weeks here in the clinic. If anything worsens or changes patient will contact our office for additional recommendations. You are fine Electronic Signature(s) Signed: 07/23/2021 3:08:07 PM By: Worthy Keeler PA-C Entered By: Worthy Keeler on 07/23/2021 15:08:07 Amy Moses ([id number]) -------------------------------------------------------------------------------- SuperBill Details Patient Name: Amy Moses Date of Service: 07/23/2021 Medical Record Number: [id number] Patient Account Number: [id number] Date of Birth/Sex: 1971-01-15 (49 y.o. 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 L89.610 Pressure ulcer of right heel, unstageable E11.42 Type 2 diabetes mellitus with diabetic polyneuropathy L89.313 Pressure ulcer of right buttock, stage 3 L89.323 Pressure ulcer of left buttock, stage 3 L97.822 Non-pressure chronic ulcer of other part of left lower leg with fat layer exposed L97.812 Non-pressure chronic ulcer of other part of right lower leg with fat layer exposed Physician Procedures CPT4 Code: [id number] Description: [id number] - WC PHYS LEVEL 4 - EST PT Modifier: Quantity: 1 CPT4 Code: Description: ICD-10 Diagnosis Description E83.59 Other  disorders of calcium metabolism L97.118 Non-pressure chronic ulcer of right thigh with other specified severit L97.128 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

## 2021-08-06 ENCOUNTER — Encounter: Payer: Medicare Other | Admitting: Physician Assistant

## 2021-08-06 ENCOUNTER — Encounter (HOSPITAL_COMMUNITY): Payer: Self-pay

## 2021-08-06 ENCOUNTER — Other Ambulatory Visit: Payer: Self-pay

## 2021-08-06 ENCOUNTER — Inpatient Hospital Stay (HOSPITAL_COMMUNITY)
Admission: EM | Admit: 2021-08-06 | Discharge: 2021-08-12 | DRG: 299 | Disposition: A | Discharge status: Home / Self Care | Payer: Medicare Other | Attending: Family Medicine | Admitting: Family Medicine

## 2021-08-06 DIAGNOSIS — I953 Hypotension of hemodialysis: Secondary | ICD-10-CM | POA: Diagnosis not present

## 2021-08-06 DIAGNOSIS — Z794 Long term (current) use of insulin: Secondary | ICD-10-CM

## 2021-08-06 DIAGNOSIS — L97923 Non-pressure chronic ulcer of unspecified part of left lower leg with necrosis of muscle: Secondary | ICD-10-CM | POA: Diagnosis present

## 2021-08-06 DIAGNOSIS — L03116 Cellulitis of left lower limb: Secondary | ICD-10-CM | POA: Diagnosis present

## 2021-08-06 DIAGNOSIS — E119 Type 2 diabetes mellitus without complications: Secondary | ICD-10-CM

## 2021-08-06 DIAGNOSIS — Z992 Dependence on renal dialysis: Secondary | ICD-10-CM

## 2021-08-06 DIAGNOSIS — L97129 Non-pressure chronic ulcer of left thigh with unspecified severity: Secondary | ICD-10-CM | POA: Diagnosis present

## 2021-08-06 DIAGNOSIS — H5461 Unqualified visual loss, right eye, normal vision left eye: Secondary | ICD-10-CM | POA: Diagnosis present

## 2021-08-06 DIAGNOSIS — I120 Hypertensive chronic kidney disease with stage 5 chronic kidney disease or end stage renal disease: Secondary | ICD-10-CM | POA: Diagnosis present

## 2021-08-06 DIAGNOSIS — E1152 Type 2 diabetes mellitus with diabetic peripheral angiopathy with gangrene: Principal | ICD-10-CM | POA: Diagnosis present

## 2021-08-06 DIAGNOSIS — M79662 Pain in left lower leg: Secondary | ICD-10-CM | POA: Diagnosis not present

## 2021-08-06 DIAGNOSIS — E118 Type 2 diabetes mellitus with unspecified complications: Secondary | ICD-10-CM

## 2021-08-06 DIAGNOSIS — I10 Essential (primary) hypertension: Secondary | ICD-10-CM | POA: Diagnosis present

## 2021-08-06 DIAGNOSIS — H547 Unspecified visual loss: Secondary | ICD-10-CM

## 2021-08-06 DIAGNOSIS — I96 Gangrene, not elsewhere classified: Secondary | ICD-10-CM | POA: Diagnosis present

## 2021-08-06 DIAGNOSIS — L97419 Non-pressure chronic ulcer of right heel and midfoot with unspecified severity: Secondary | ICD-10-CM | POA: Diagnosis present

## 2021-08-06 DIAGNOSIS — E1165 Type 2 diabetes mellitus with hyperglycemia: Secondary | ICD-10-CM

## 2021-08-06 DIAGNOSIS — S91309A Unspecified open wound, unspecified foot, initial encounter: Secondary | ICD-10-CM | POA: Diagnosis present

## 2021-08-06 DIAGNOSIS — L089 Local infection of the skin and subcutaneous tissue, unspecified: Secondary | ICD-10-CM

## 2021-08-06 DIAGNOSIS — E739 Lactose intolerance, unspecified: Secondary | ICD-10-CM | POA: Diagnosis present

## 2021-08-06 DIAGNOSIS — Z79890 Hormone replacement therapy (postmenopausal): Secondary | ICD-10-CM

## 2021-08-06 DIAGNOSIS — N186 End stage renal disease: Secondary | ICD-10-CM | POA: Diagnosis present

## 2021-08-06 DIAGNOSIS — E11621 Type 2 diabetes mellitus with foot ulcer: Secondary | ICD-10-CM | POA: Diagnosis present

## 2021-08-06 DIAGNOSIS — D631 Anemia in chronic kidney disease: Secondary | ICD-10-CM | POA: Diagnosis present

## 2021-08-06 DIAGNOSIS — E11622 Type 2 diabetes mellitus with other skin ulcer: Secondary | ICD-10-CM | POA: Diagnosis present

## 2021-08-06 DIAGNOSIS — E039 Hypothyroidism, unspecified: Secondary | ICD-10-CM | POA: Diagnosis present

## 2021-08-06 DIAGNOSIS — L97211 Non-pressure chronic ulcer of right calf limited to breakdown of skin: Secondary | ICD-10-CM

## 2021-08-06 DIAGNOSIS — L97223 Non-pressure chronic ulcer of left calf with necrosis of muscle: Secondary | ICD-10-CM | POA: Diagnosis present

## 2021-08-06 DIAGNOSIS — Z79899 Other long term (current) drug therapy: Secondary | ICD-10-CM

## 2021-08-06 DIAGNOSIS — E785 Hyperlipidemia, unspecified: Secondary | ICD-10-CM | POA: Diagnosis present

## 2021-08-06 DIAGNOSIS — Z8249 Family history of ischemic heart disease and other diseases of the circulatory system: Secondary | ICD-10-CM

## 2021-08-06 DIAGNOSIS — L97229 Non-pressure chronic ulcer of left calf with unspecified severity: Secondary | ICD-10-CM | POA: Diagnosis present

## 2021-08-06 DIAGNOSIS — I872 Venous insufficiency (chronic) (peripheral): Secondary | ICD-10-CM | POA: Diagnosis present

## 2021-08-06 DIAGNOSIS — T148XXA Other injury of unspecified body region, initial encounter: Secondary | ICD-10-CM

## 2021-08-06 DIAGNOSIS — Z20822 Contact with and (suspected) exposure to covid-19: Secondary | ICD-10-CM | POA: Diagnosis present

## 2021-08-06 DIAGNOSIS — Z833 Family history of diabetes mellitus: Secondary | ICD-10-CM

## 2021-08-06 DIAGNOSIS — Z7982 Long term (current) use of aspirin: Secondary | ICD-10-CM

## 2021-08-06 DIAGNOSIS — IMO0002 Reserved for concepts with insufficient information to code with codable children: Secondary | ICD-10-CM

## 2021-08-06 DIAGNOSIS — E1122 Type 2 diabetes mellitus with diabetic chronic kidney disease: Secondary | ICD-10-CM | POA: Diagnosis present

## 2021-08-06 LAB — CBC WITH DIFFERENTIAL/PLATELET
Abs Immature Granulocytes: 0.05 10*3/uL (ref 0.00–0.07)
Basophils Absolute: 0.1 10*3/uL (ref 0.0–0.1)
Basophils Relative: 1 %
Eosinophils Absolute: 0.2 10*3/uL (ref 0.0–0.5)
Eosinophils Relative: 2 %
HCT: 36.1 % (ref 36.0–46.0)
Hemoglobin: 10.6 g/dL — ABNORMAL LOW (ref 12.0–15.0)
Immature Granulocytes: 0 %
Lymphocytes Relative: 9 %
Lymphs Abs: 1.1 10*3/uL (ref 0.7–4.0)
MCH: 23.7 pg — ABNORMAL LOW (ref 26.0–34.0)
MCHC: 29.4 g/dL — ABNORMAL LOW (ref 30.0–36.0)
MCV: 80.6 fL (ref 80.0–100.0)
Monocytes Absolute: 1.4 10*3/uL — ABNORMAL HIGH (ref 0.1–1.0)
Monocytes Relative: 11 %
Neutro Abs: 9.8 10*3/uL — ABNORMAL HIGH (ref 1.7–7.7)
Neutrophils Relative %: 77 %
Platelets: 316 10*3/uL (ref 150–400)
RBC: 4.48 MIL/uL (ref 3.87–5.11)
RDW: 17.8 % — ABNORMAL HIGH (ref 11.5–15.5)
WBC: 12.7 10*3/uL — ABNORMAL HIGH (ref 4.0–10.5)
nRBC: 0 % (ref 0.0–0.2)

## 2021-08-06 LAB — COMPREHENSIVE METABOLIC PANEL
ALT: 10 U/L (ref 0–44)
AST: 10 U/L — ABNORMAL LOW (ref 15–41)
Albumin: 2.4 g/dL — ABNORMAL LOW (ref 3.5–5.0)
Alkaline Phosphatase: 122 U/L (ref 38–126)
Anion gap: 11 (ref 5–15)
BUN: 29 mg/dL — ABNORMAL HIGH (ref 6–20)
CO2: 28 mmol/L (ref 22–32)
Calcium: 9.4 mg/dL (ref 8.9–10.3)
Chloride: 95 mmol/L — ABNORMAL LOW (ref 98–111)
Creatinine, Ser: 3.78 mg/dL — ABNORMAL HIGH (ref 0.44–1.00)
GFR, Estimated: 14 mL/min — ABNORMAL LOW (ref >60)
Glucose, Bld: 277 mg/dL — ABNORMAL HIGH (ref 70–99)
Potassium: 3.8 mmol/L (ref 3.5–5.1)
Sodium: 134 mmol/L — ABNORMAL LOW (ref 135–145)
Total Bilirubin: 0.7 mg/dL (ref 0.3–1.2)
Total Protein: <3 g/dL — ABNORMAL LOW (ref 6.5–8.1)

## 2021-08-06 LAB — LACTIC ACID, PLASMA
Lactic Acid, Venous: 1.5 mmol/L (ref 0.5–1.9)
Lactic Acid, Venous: 1.1 mmol/L (ref 0.5–1.9)

## 2021-08-06 NOTE — ED Triage Notes (Signed)
Pt has wound to left upper thigh, took doxycycline without improvement.

## 2021-08-06 NOTE — Progress Notes (Addendum)
Amy, LAULETTA ([id number]) Visit Report for 08/06/2021 Chief Complaint Document Details Patient Name: Amy Moses Date of Service: 08/06/2021 11:45 AM Medical Record Number: [id number] Patient Account Number: [id number] Date of Birth/Sex: 07-Nov-1971 (50 y.o. F) Treating RN: Cornell Barman Primary Care Provider: Zenon Mayo Other Clinician: Referring Provider: Zenon Mayo Treating Provider/Extender: Skipper Cliche in Treatment: 35 Information Obtained from: Patient Chief Complaint 12/02/2020; patient is here for review of extensive wounds on her bilateral thighs as well as an area on her right plantar heel Electronic Signature(s) Signed: 08/06/2021 12:23:53 PM By: Worthy Keeler PA-C Entered By: Worthy Keeler on 08/06/2021 12:23:53 Amy Moses ([id number]) -------------------------------------------------------------------------------- Debridement Details Patient Name: Amy Moses Date of Service: 08/06/2021 11:45 AM Medical Record Number: [id number] Patient Account Number: [id number] Date of Birth/Sex: [birth year] (50 y.o. F) Treating RN: Cornell Barman Primary Care Provider: Zenon Mayo Other Clinician: Referring Provider: Zenon Mayo Treating Provider/Extender: Skipper Cliche in Treatment: 35 Debridement Performed for Wound #14 Right Gluteus Assessment: Performed By: Physician Tommie Sams., PA-C Debridement Type: Debridement Level of Consciousness (Pre- Awake and Alert procedure): Pre-procedure Verification/Time Out Yes - 12:40 Taken: Start Time: 12:40 Pain Control: Lidocaine 4% Topical Solution Total Area Debrided (L x W): 3.5 (cm) x 2 (cm) = 7 (cm) Tissue and other material Viable, Non-Viable, Slough, Subcutaneous, Biofilm, Slough debrided: Level: Skin/Subcutaneous Tissue Debridement Description: Excisional Instrument: Curette Bleeding: Minimum Hemostasis Achieved: Pressure End Time: 12:41 Response to Treatment: Procedure was tolerated  well Level of Consciousness (Post- Awake and Alert procedure): Post Debridement Measurements of Total Wound Length: (cm) 3.5 Stage: Category/Stage II Width: (cm) 2 Depth: (cm) 0.2 Volume: (cm) 1.1 Character of Wound/Ulcer Post Debridement: Improved Post Procedure Diagnosis Same as Pre-procedure Electronic Signature(s) Signed: 08/06/2021 12:55:52 PM By: Worthy Keeler PA-C Signed: 08/09/2021 4:05:19 PM By: Gretta Cool, BSN, RN, CWS, Kim RN, BSN Entered By: Worthy Keeler on 08/06/2021 12:55:52 Amy Moses ([id number]) -------------------------------------------------------------------------------- Debridement Details Patient Name: Amy Moses Date of Service: 08/06/2021 11:45 AM Medical Record Number: [id number] Patient Account Number: [id number] Date of Birth/Sex: [birth year] (50 y.o. F) Treating RN: Cornell Barman Primary Care Provider: Zenon Mayo Other Clinician: Referring Provider: Zenon Mayo Treating Provider/Extender: Skipper Cliche in Treatment: 35 Debridement Performed for Wound #15 Left Gluteal fold Assessment: Performed By: Physician Tommie Sams., PA-C Debridement Type: Debridement Level of Consciousness (Pre- Awake and Alert procedure): Pre-procedure Verification/Time Out Yes - 12:42 Taken: Start Time: 12:42 Pain Control: Lidocaine 4% Topical Solution Total Area Debrided (L x W): 1 (cm) x 0.7 (cm) = 0.7 (cm) Tissue and other material Slough, Subcutaneous, Biofilm, Slough debrided: Level: Skin/Subcutaneous Tissue Debridement Description: Excisional Instrument: Curette Bleeding: Minimum Hemostasis Achieved: Pressure End Time: 12:43 Response to Treatment: Procedure was tolerated well Level of Consciousness (Post- Awake and Alert procedure): Post Debridement Measurements of Total Wound Length: (cm) 1 Stage: Category/Stage III Width: (cm) 0.7 Depth: (cm) 0.2 Volume: (cm) 0.11 Character of Wound/Ulcer Post Debridement: Improved Post  Procedure Diagnosis Same as Pre-procedure Electronic Signature(s) Signed: 08/06/2021 12:59:20 PM By: Worthy Keeler PA-C Signed: 08/09/2021 4:05:19 PM By: Gretta Cool, BSN, RN, CWS, Kim RN, BSN Entered By: Worthy Keeler on 08/06/2021 12:59:20 Amy Moses ([id number]) -------------------------------------------------------------------------------- Debridement Details Patient Name: Amy Moses Date of Service: 08/06/2021 11:45 AM Medical Record Number: [id number] Patient Account Number: [id number] Date of Birth/Sex: 04/14/1971 (50 y.o. F) Treating RN: Cornell Barman Primary Care Provider: Zenon Mayo Other Clinician: Referring Provider: Zenon Mayo Treating Provider/Extender: Skipper Cliche in Treatment: 35 Debridement Performed for Wound #9 Left,Lateral  Calf Assessment: Performed By: Physician Tommie Sams., PA-C Debridement Type: Debridement Level of Consciousness (Pre- Awake and Alert procedure): Pre-procedure Verification/Time Out Yes - 12:44 Taken: Start Time: 12:44 Pain Control: Lidocaine 4% Topical Solution Total Area Debrided (L x W): 4 (cm) x 6 (cm) = 24 (cm) Tissue and other material Viable, Non-Viable, Muscle, Slough, Subcutaneous, Biofilm, Slough debrided: Level: Skin/Subcutaneous Tissue/Muscle Debridement Description: Excisional Instrument: Curette Bleeding: Minimum Hemostasis Achieved: Pressure End Time: 12:48 Response to Treatment: Procedure was tolerated well Level of Consciousness (Post- Awake and Alert procedure): Post Debridement Measurements of Total Wound Length: (cm) 16.5 Width: (cm) 14 Depth: (cm) 3.2 Volume: (cm) 580.566 Character of Wound/Ulcer Post Debridement: Improved Post Procedure Diagnosis Same as Pre-procedure Electronic Signature(s) Signed: 08/06/2021 1:02:19 PM By: Worthy Keeler PA-C Signed: 08/09/2021 4:05:19 PM By: Gretta Cool, BSN, RN, CWS, Kim RN, BSN Entered By: Worthy Keeler on 08/06/2021 13:02:19 Amy Moses  ([id number]) -------------------------------------------------------------------------------- HPI Details Patient Name: Amy Moses Date of Service: 08/06/2021 11:45 AM Medical Record Number: [id number] Patient Account Number: [id number] Date of Birth/Sex: 02/19/1971 (50 y.o. F) Treating RN: Cornell Barman Primary Care Provider: Zenon Mayo Other Clinician: Referring Provider: Zenon Mayo Treating Provider/Extender: Skipper Cliche in Treatment: 16 History of Present Illness HPI Description: ADMISSION 12/02/2020 This is a 50 year old woman who is a type II diabetic. Although there are mentions of type 1 diabetes in epic clearly this woman is type II based on the fact that she was on oral agents for 10 years before starting insulin. She also is in chronic renal failure and recently started on dialysis I think in November. She relates her problems starting in September she started to develop skin excoriation on her bilateral inner thighs. She thought this was a friction phenomenon however the tissue wrist can progressively broke down. She was seen by her primary doctor on 10/21/2020 noted to have erythema on both thighs medially and a blister. MRIs were ordered and she was put on Silvadene and gauze. She was seen in the ER on 12/12 at the [place] [facility] also noted to have a right unstageable heel wound. 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 She dialyzes at [facility] in [name] and she was started on sodium thiosulfate as far as the notes state. The patient states she gets something at dialysis every day although she has not exactly sure what they are giving her. She was noted by vein and vascular in [place] to have multiple open wounds on 11/19/2020 using Xeroform gauze. She had an angiogram booked by Dr. Oneida Alar predominantly I think because of the right heel ulcer although this was canceled by the patient because of the  weather. The patient has large necrotic wounds on both inner thighs with covering black eschar. There is also an area on the left lateral thigh and an eschared area on her right plantar heel. She has been using Betadine to the right heel Xeroform to the areas on her legs. The patient has Medicaid but is able to get the Xeroform, were not really sure how she is managing this although she lives in [place] and there may be different rules for Medicaid in [place] versus [place]. We certainly would not be able to get that here. 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 I wondered whether her neuropathy is particularly affected her sensation of pain since her recent left patella  fracture does not seem to have been that painful either Past medical history includes stage V chronic renal failure starting on dialysis I think in November, clearly type 2 diabetes with neuropathy, hypertension, glaucoma, vitamin D deficiency, history of cholecystectomy, edema of both legs, hypothyroidism, she dialyzes Tuesday Thursday and Saturday at [facility] in [place] The patient had arterial studies on 11/19/2020 I think at vein and vascular in [place]. She had biphasic waveforms throughout the thigh on the right and at the popliteal proximally and distally in the ATA but the PTA and peroneal were not visualized. On the left again biphasic waveforms up to the level of the distal popliteal but not visualized in the tibial vessels. She was felt to have adequate flow where visualized. As noted she was supposed to have an angiogram which I think is certainly indicated [id number] upon evaluation today patient actually appears to be doing okay in regard to her wounds. This is actually the first time of seeing her she saw Dr. Dellia Nims at the last visit she does have quite an extensive history based on review. With that being said I do  not see any signs right now of active infection which is great news. Overall I am extremely pleased with where things stand in that regard. 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 I really feel like she may do better with Dakin's moistened gauze. I do think that we can see about ordering the Dakin's for her she is already using gauze and then wrapping with roll gauze anyway so really would not change much except for moistening some gauze and applying it to the wound bed. With that being said I do not see any signs of active infection at this time which is great news. The patient all in all seems to be in fairly good spirits all things considered. 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 Fortunately it looks like some of the eschar is loosening up so we should be able to get this removed which hopefully will help in speeding up the healing process. With that being said with regard to the patient's gluteal region she unfortunately has new pressure ulcerations open today which I think could benefit from possibly having a air mattress. With that being said I explained to the patient that unfortunately this could still take some time as far as getting it to heal completely. There does not appear to be any signs of active infection at this time which is great news. No fevers, chills, nausea, vomiting, or diarrhea. 02/16/2021 upon evaluation today patient appears to be doing about the same in regard to her heel. Her gluteal area is actually getting better. She did have surgery in regard to her thighs bilaterally as well as her knee. It was not until she got into the operating room that it was noted that she had the wounds on the thighs. Subsequently according to the surgeons note  dated 02/01/2021 they contacted the patient's daughter in order to discuss the fact they felt she needed to have the wound surgically debrided and a wound VAC placed and subsequently instead of patellar reconstruction they opted to proceed with removal of the Portion of the patella in order to get this area to heal more effectively and quickly. Overall that seems to have  done quite well. 02/26/2021 upon evaluation today patient's wounds actually appear to be doing excellent pretty much across the board I am very pleased. With that being said the wounds where she has the wound vacs placed on the thighs in particular seems to be doing a very good job as far as healing is concerned. There does not appear to be any evidence of infection which is great news and overall I am extremely pleased in that regard. No fevers, chills, nausea, vomiting, or diarrhea. 03/12/21 upon evaluation today patient appears to be doing well with regard to her wounds. I think she is doing excellent with the wound VAC and very pleased in that regard. Fortunately there does not appear to be any signs of infection I am that a try to do a little bit of light debridement today due to some necrotic regions noted in her wounds to try to help speed up the healing process here. 04/05/2021 upon evaluation today patient appears to be doing decently well in general in regard to her wounds. She was unfortunately in the hospital from the 10th through the 12th of this month due to having high fever and subsequently they did not actually determine exactly what was going on they thought she might of just been sick or had some kind of a cold. With that being said fortunately there does not appear to be any evidence of active infection at this time which is great news. No fevers, chills, nausea, vomiting, or diarrhea. Amy, Moses ([id number]) 05/03/2021 upon evaluation today patient actually appears to be doing excellent for the most part in regard  to the wounds on her thighs. I am extremely happy with what I see today. In fact I feel like it is probably time for [name] to discontinue the use of the wound VAC as this appears to be doing so well. The patient voiced an understanding and agreement and is definitely happy to get rid of the wound VAC. She was recently in the hospital but this was secondary to something unrelated to her wound she had a polyp which was bleeding and had to be taken care of this was causing significant issues she has since been taken off of her aspirin. 05/21/2021 upon evaluation today patient appears to be doing well with regard to her wound. She has been tolerating the dressing changes without complication with regard to the [facility] in the upper thigh region. We have been using Dakin's moistened gauze on the legs and the heel. Fortunately everything seems to be making good progress here in general which is great news. The unfortunate thing is that we have been having issues here with some necrotic tissue and drainage on the lateral portions of her legs. Fortunately there does not appear to be any signs of systemic infection although locally there may be a little bit of infection here based on the drainage which is somewhat blue-green in nature. I am going to perform a culture postdebridement of the right calf/lower extremity region. She tolerated that today without complication. I did perform that culture as well. 06/04/2021 upon evaluation today patient appears to be doing well with regard to the wounds on her thighs in general. Fortunately there does not appear to be any signs of active infection which is great news I am very pleased in that regard. In regard to her lower extremities I do feel like that she is making some good progress here as well. This includes the use of the Dakin's moistened gauze  which has done a great job as far as keeping the wounds clean at this point. Overall I am extremely happy with where  we stand. 06/18/2021 upon evaluation today patient appears to be doing a little better in regard to most of her wounds in general. She does have a couple areas again and needs some debridement as far as the left lateral leg and the right heel are concerned. Otherwise she also has a small area of fluid on the right medial thigh where this is mostly healed but nonetheless there is a small opening that is of concern here. Fortunately there is no evidence of active infection at this time. No fevers, chills, nausea, vomiting, or diarrhea. 07/08/2021 upon evaluation today patient appears to be doing quite well in regard to her wounds on the right thigh region and left medial thigh region. That is about the extent of what seems to be doing good though everything else is at least the same if not a little bit worse unfortunately. Currently that includes the right lateral thigh where she has pus draining from this area we are probably need to culture this region. There is also some depth to the area where this is draining from. She also has significant wounds on the calf region right and left of the left especially starting to migrate and looking like it is getting worse. We been using Dakin's on this area. Her heel seems to be doing about the same there is really no significant improvement in general. 07/23/2021 upon evaluation today patient actually appears to be doing quite well in regard to her wounds in general. Most everything is actually showing signs of significant improvement. She did see plastic surgery, Dr. Claudia Desanctis yesterday. With that being said it appears based on what the patient is telling me that he really did not feel there was anything from a surgical standpoint that he had to offer as far as treatment was concerned. Again this was part of the reason that I sent her in order to determine whether or not there was anything that could be done or not. With that being said he did mention making a [facility] to see if there is anything they had to offer but in general it sounds like that is probably not going to be the case. With all that being said the one area that we have an issue is on the left lateral thigh where unfortunately she is having a significant amount of purulent drainage there still some depth here I think we need to pack this area Hydrofera Blue rope will probably do well for her. 08/06/2021 upon evaluation today patient appears to be doing poorly in regard to her left leg in general. Unfortunately I think that she is going require light sharp debridement of the gluteal area but I think that this really is not doing too badly at all. With regard to the left leg however this is significantly worse than what I previously noted. I do believe that in fact the calf region is a lot deeper even than 2 weeks ago quite significantly and subsequently also think that she has a significant infection in the upper part of the thigh on the lateral/hip location where we noted an abscess before I did do a deep wound culture revealed multiple organisms nothing predominant. Subsequently coupled with the fact that she is having a lot of issues with pain in this area as well and still having purulent pus  despite the doxycycline I think that it really has not treated the situation she probably has a deeper abscess that needs to be further managed more aggressively than what I can do in the outpatient setting. Electronic Signature(s) Signed: 08/06/2021 1:03:56 PM By: Worthy Keeler PA-C Entered By: Worthy Keeler on 08/06/2021 13:03:56 Amy Moses ([id number]) -------------------------------------------------------------------------------- Physical Exam Details Patient Name: Amy Moses Date of Service: 08/06/2021 11:45 AM Medical Record Number: [id number] Patient Account Number: [id number] Date of Birth/Sex: 10/16/1971 (50 y.o. F) Treating RN: Cornell Barman Primary  Care Provider: Zenon Mayo Other Clinician: Referring Provider: Zenon Mayo Treating Provider/Extender: Skipper Cliche in Treatment: 19 Constitutional Well-nourished and well-hydrated in no acute distress. Respiratory normal breathing without difficulty. Psychiatric this patient is able to make decisions and demonstrates good insight into disease process. Alert and Oriented x 3. pleasant and cooperative. Notes Upon inspection patient's wound bed actually showed signs of good healing in regard to the gluteal regions. I am pleased in this regard. With that being said I did perform light debridement over the gluteal areas in order to treat these regions I do not see anything abnormal in this area and I think that with appropriate care these will heal quite readily. Unfortunately in regard to the left leg and even the heel locations I really just do not feel like anything is doing much better and in fact the leg is significantly worse in regard to the calf region. I am also seeing significant issues with the left lateral hip/upper thigh location where unfortunately she seems to have a significant abscess with still purulent drainage despite being on doxycycline for 2 weeks I feel like this needs to be addressed more effectively and I think she really likely needs to be admitted to the hospital in order to get this under control. Electronic Signature(s) Signed: 08/06/2021 1:04:59 PM By: Worthy Keeler PA-C Entered By: Worthy Keeler on 08/06/2021 13:04:59 Amy Moses ([id number]) -------------------------------------------------------------------------------- Physician Orders Details Patient Name: Amy Moses Date of Service: 08/06/2021 11:45 AM Medical Record Number: [id number] Patient Account Number: [id number] Date of Birth/Sex: April 28, 1971 (50 y.o. F) Treating RN: Cornell Barman Primary Care Provider: Zenon Mayo Other Clinician: Referring Provider: Zenon Mayo Treating Provider/Extender: Skipper Cliche in Treatment: 1 Verbal / Phone Orders: No Diagnosis Coding ICD-10 Coding Code Description E83.59 Other disorders of calcium metabolism L97.118 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 L97.128 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 L89.610 Pressure ulcer of right heel, unstageable E11.42 Type 2 diabetes mellitus with diabetic polyneuropathy L89.313 Pressure ulcer of right buttock, stage 3 L89.323 Pressure ulcer of left buttock, stage 3 L97.822 Non-pressure chronic ulcer of other part of left lower leg with fat layer exposed L97.812 Non-pressure chronic ulcer of other part of right lower leg with fat layer exposed Follow-up Appointments o Return Appointment in 2 weeks. Amy Moses Off-Loading o Gel wheelchair cushion o Air fluidized (Group 3) - stay in the bed except for meals. o Turn and reposition every 2 hours - Recommend PREVALON BOOTS WHEN IN BED FOR OFFLOADING PRESSURE OFF LEG WOUNDS o Other: - keep pressure off of wounded areas. Wound Treatment Wound #11 - Lower Leg Wound Laterality: Right, Posterior, Distal Cleanser: Dakin 16 (oz) 0.25 1 x Per Day/30 Days Discharge Instructions: Use as directed. Primary Dressing: Gauze 1 x Per Day/30 Days Discharge Instructions: As directed: dry, moistened with saline or moistened with  Dakins Solution Secondary Dressing: ABD Pad 5x9 (in/in) 1 x Per Day/30 Days Discharge Instructions: Cover with ABD pad Secured With: 58M Medipore H Soft Cloth Surgical Tape, 2x2 (in/yd) 1 x Per Day/30 Days Secured With: Conforming Stretch Gauze Bandage 4x75 (in/in) 1 x Per Day/30 Days Discharge Instructions: Apply as directed Wound #14 - Gluteus Wound Laterality: Right Cleanser: Soap and Water 3 x Per Week/30 Days Discharge Instructions: Gently cleanse wound with  antibacterial soap, rinse and pat dry prior to dressing wounds Primary Dressing: Hydrofera Blue Ready Transfer Foam, 2.5x2.5 (in/in) 3 x Per Week/30 Days Discharge Instructions: Apply Hydrofera Blue Ready to wound bed as directed Secondary Dressing: Zetuvit Plus Silicone Border Dressing 4x4 (in/in) 3 x Per Week/30 Days Wound #15 - Gluteal fold Wound Laterality: Left Cleanser: Soap and Water 3 x Per Week/30 Days Amy Moses, Amy Moses ([id number]) Discharge Instructions: Gently cleanse wound with antibacterial soap, rinse and pat dry prior to dressing wounds Primary Dressing: Hydrofera Blue Ready Transfer Foam, 2.5x2.5 (in/in) 3 x Per Week/30 Days Discharge Instructions: Apply Hydrofera Blue Ready to wound bed as directed Secondary Dressing: Zetuvit Plus Silicone Border Dressing 4x4 (in/in) 3 x Per Week/30 Days Wound #16 - Foot Wound Laterality: Plantar, Left, Distal Cleanser: Soap and Water 3 x Per Week/30 Days Discharge Instructions: Gently cleanse wound with antibacterial soap, rinse and pat dry prior to dressing wounds Primary Dressing: Hydrofera Blue Ready Transfer Foam, 2.5x2.5 (in/in) 3 x Per Week/30 Days Discharge Instructions: Apply Hydrofera Blue Ready to wound bed as directed Secondary Dressing: ABD Pad 5x9 (in/in) 3 x Per Week/30 Days Discharge Instructions: Cover with ABD pad Secured With: 58M Medipore H Soft Cloth Surgical Tape, 2x2 (in/yd) 3 x Per Week/30 Days Secured With: [employer] Sterile or Non-Sterile 6-ply 4.5x4 (yd/yd) 3 x Per Week/30 Days Discharge Instructions: Apply Kerlix as directed Wound #2 - Upper Leg Wound Laterality: Left, Medial Cleanser: Normal Saline 3 x Per Week/30 Days Discharge Instructions: Wash your hands with soap and water. Remove old dressing, discard into plastic bag and place into trash. Cleanse the wound with Normal Saline prior to applying a clean dressing using gauze sponges, not tissues or cotton balls. Do not scrub or use excessive force. Pat dry  using gauze sponges, not tissue or cotton balls. Primary Dressing: Hydrofera Blue Ready Transfer Foam, 2.5x2.5 (in/in) 3 x Per Week/30 Days Discharge Instructions: Apply Hydrofera Blue Ready to wound bed as directed Secondary Dressing: ABD Pad 5x9 (in/in) 3 x Per Week/30 Days Discharge Instructions: Cover with ABD pad Secured With: 58M Medipore H Soft Cloth Surgical Tape, 2x2 (in/yd) 3 x Per Week/30 Days Wound #3 - Upper Leg Wound Laterality: Left, Lateral Cleanser: Normal Saline 3 x Per Week/30 Days Discharge Instructions: Wash your hands with soap and water. Remove old dressing, discard into plastic bag and place into trash. Cleanse the wound with Normal Saline prior to applying a clean dressing using gauze sponges, not tissues or cotton balls. Do not scrub or use excessive force. Pat dry using gauze sponges, not tissue or cotton balls. Primary Dressing: Hydrofera Blue Classic Foam Rope Dressing, 9x6 (mm/in) 3 x Per Week/30 Days Secondary Dressing: ABD Pad 5x9 (in/in) 3 x Per Week/30 Days Discharge Instructions: Cover with ABD pad Secured With: 58M Medipore H Soft Cloth Surgical Tape, 2x2 (in/yd) 3 x Per Week/30 Days Secured With: Conforming Stretch Gauze Bandage 4x75 (in/in) 3 x Per Week/30 Days Discharge Instructions: Apply as directed Wound #4 - Calcaneus Wound Laterality: Right Primary Dressing: Hydrofera Blue Ready Transfer Foam,  4x5 (in/in) 1 x Per Day/30 Days Discharge Instructions: Apply Hydrofera Blue Ready to wound bed as directed Secondary Dressing: ABD Pad 5x9 (in/in) 1 x Per Day/30 Days Discharge Instructions: Cover with ABD pad Secured With: 81M Medipore H Soft Cloth Surgical Tape, 2x2 (in/yd) 1 x Per Day/30 Days Secured With: Conforming Stretch Gauze Bandage 4x75 (in/in) 1 x Per Day/30 Days Discharge Instructions: Apply as directed Wound #9 - Calf Wound Laterality: Left, Lateral Moses, Amy ([id number]) Cleanser: Dakin 16 (oz) 0.25 1 x Per Day/30 Days Discharge  Instructions: Use as directed. Primary Dressing: Hydrofera Blue Classic Foam Rope Dressing, 9x6 (mm/in) 1 x Per Day/30 Days Secondary Dressing: ABD Pad 5x9 (in/in) 1 x Per Day/30 Days Discharge Instructions: Cover with ABD pad Secured With: 81M Medipore H Soft Cloth Surgical Tape, 2x2 (in/yd) 1 x Per Day/30 Days Secured With: Conforming Stretch Gauze Bandage 4x75 (in/in) 1 x Per Day/30 Days Discharge Instructions: Apply as directed Electronic Signature(s) Signed: 08/06/2021 1:06:55 PM By: Worthy Keeler PA-C Entered By: Worthy Keeler on 08/06/2021 13:06:54 Amy Moses ([id number]) -------------------------------------------------------------------------------- Problem List Details Patient Name: Amy Moses Date of Service: 08/06/2021 11:45 AM Medical Record Number: [id number] Patient Account Number: [id number] Date of Birth/Sex: [birth year] (50 y.o. 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 12/02/2020 No Yes L97.128 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 12/02/2020 No Yes L89.610 Pressure ulcer of right heel, unstageable 12/02/2020 No Yes E11.42 Type 2 diabetes mellitus with diabetic polyneuropathy 12/02/2020 No Yes L89.313 Pressure ulcer of right buttock, stage 3 01/19/2021 No Yes L89.323 Pressure ulcer of left buttock, stage 3 01/19/2021 No Yes L97.822 Non-pressure chronic ulcer of other part of left lower leg with fat layer 05/03/2021 No Yes exposed L97.812 Non-pressure chronic ulcer of other part of right lower leg with fat layer 05/03/2021 No Yes exposed Inactive Problems Resolved  Problems Electronic Signature(s) Signed: 08/06/2021 12:23:46 PM By: Worthy Keeler PA-C Entered By: Worthy Keeler on 08/06/2021 12:23:46 Amy Moses ([id number]) -------------------------------------------------------------------------------- Progress Note Details Patient Name: Amy Moses Date of Service: 08/06/2021 11:45 AM Medical Record Number: [id number] Patient Account Number: [id number] Date of Birth/Sex: 11-07-1971 (50 y.o. F) Treating RN: Cornell Barman Primary Care Provider: Zenon Mayo Other Clinician: Referring Provider: Zenon Mayo Treating Provider/Extender: Skipper Cliche in Treatment: 11 Subjective Chief Complaint Information obtained from Patient 12/02/2020; patient is here for review of extensive wounds on her bilateral thighs as well as an area on her right plantar heel History of Present Illness (HPI) ADMISSION 12/02/2020 This is a 50 year old woman who is a type II diabetic. Although there are mentions of type 1 diabetes in epic clearly this woman is type II based on the fact that she was on oral agents for 10 years before starting insulin. She also is in chronic renal failure and recently started on dialysis I think in November. She relates her problems starting in September she started to develop skin excoriation on her bilateral inner thighs. She thought this was a friction phenomenon however the tissue wrist can progressively broke down. She was seen by her primary doctor on 10/21/2020 noted to have erythema on both thighs medially and a blister. MRIs were ordered and she was put on Silvadene and  gauze. She was seen in the ER on 12/12 at the [place] [facility] also noted to have a right unstageable heel wound. 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 She dialyzes at [facility] in [facility] and she was started on sodium thiosulfate as far as the notes state. The patient states she gets something at  dialysis every day although she has not exactly sure what they are giving her. She was noted by vein and vascular in [place] to have multiple open wounds on 11/19/2020 using Xeroform gauze. She had an angiogram booked by Dr. Oneida Alar predominantly I think because of the right heel ulcer although this was canceled by the patient because of the weather. The patient has large necrotic wounds on both inner thighs with covering black eschar. There is also an area on the left lateral thigh and an eschared area on her right plantar heel. She has been using Betadine to the right heel Xeroform to the areas on her legs. The patient has Medicaid but is able to get the Xeroform, were not really sure how she is managing this although she lives in [place] and there may be different rules for Medicaid in [place] versus [place]. We certainly would not be able to get that here. 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 I wondered whether her neuropathy is particularly affected her sensation of pain since her recent left patella fracture does not seem to have been that painful either Past medical history includes stage V chronic renal failure starting on dialysis I think in November, clearly type 2 diabetes with neuropathy, hypertension, glaucoma, vitamin D deficiency, history of cholecystectomy, edema of both legs, hypothyroidism, she dialyzes Tuesday Thursday and Saturday at [facility] in [place] The patient had arterial studies on 11/19/2020 I think at vein and vascular in [place]. She had biphasic waveforms throughout the thigh on the right and at the popliteal proximally and distally in the ATA but the PTA and peroneal were not visualized. On the left again biphasic waveforms up to the level of the distal popliteal but not visualized in the tibial vessels. She was felt to have adequate flow where visualized.  As noted she was supposed to have an angiogram which I think is certainly indicated [id number] upon evaluation today patient actually appears to be doing okay in regard to her wounds. This is actually the first time of seeing her she saw Dr. Dellia Nims at the last visit she does have quite an extensive history based on review. With that being said I do not see any signs right now of active infection which is great news. Overall I am extremely pleased with where things stand in that regard. 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 I really feel like she may do better with Dakin's moistened gauze. I do think that we can see about ordering the Dakin's for her she is already using gauze and then wrapping with roll gauze anyway so really would not change much except for moistening some gauze and applying it to the wound bed. With that being said I do not see any signs of active infection at this time which is great news. The patient all in all seems to be in fairly good spirits all things considered. 01/15/2021 upon inspection today patient's wound  bed actually showed signs of still having significant eschar over the areas of calciphylaxis in regard to her thigh regions. Fortunately it looks like some of the eschar is loosening up so we should be able to get this removed which hopefully will help in speeding up the healing process. With that being said with regard to the patient's gluteal region she unfortunately has new pressure ulcerations open today which I think could benefit from possibly having a air mattress. With that being said I explained to the patient that unfortunately this could still take some time as far as getting it to heal completely. There does not appear to be any signs of active infection at this time which is great news. No fevers, chills, nausea, vomiting, or diarrhea. 02/16/2021 upon  evaluation today patient appears to be doing about the same in regard to her heel. Her gluteal area is actually getting better. She did have surgery in regard to her thighs bilaterally as well as her knee. It was not until she got into the operating room that it was noted that she had the wounds on the thighs. Subsequently according to the surgeons note dated 02/01/2021 they contacted the patient's daughter in order to discuss the fact they felt she needed to have the wound surgically debrided and a wound VAC placed and subsequently instead of patellar reconstruction they opted to proceed with removal of the Portion of the patella in order to get this area to heal more effectively and quickly. Overall that seems to have done quite well. 02/26/2021 upon evaluation today patient's wounds actually appear to be doing excellent pretty much across the board I am very pleased. With that being said the wounds where she has the wound vacs placed on the thighs in particular seems to be doing a very good job as far as healing is concerned. There does not appear to be any evidence of infection which is great news and overall I am extremely pleased in that regard. No fevers, chills, nausea, vomiting, or diarrhea. 03/12/21 upon evaluation today patient appears to be doing well with regard to her wounds. I think she is doing excellent with the wound VAC and very pleased in that regard. Fortunately there does not appear to be any signs of infection I am that a try to do a little bit of light debridement today due to some necrotic regions noted in her wounds to try to help speed up the healing process here. Amy, Moses ([id number]) 04/05/2021 upon evaluation today patient appears to be doing decently well in general in regard to her wounds. She was unfortunately in the hospital from the 10th through the 12th of this month due to having high fever and subsequently they did not actually determine exactly what was going  on they thought she might of just been sick or had some kind of a cold. With that being said fortunately there does not appear to be any evidence of active infection at this time which is great news. No fevers, chills, nausea, vomiting, or diarrhea. 05/03/2021 upon evaluation today patient actually appears to be doing excellent for the most part in regard to the wounds on her thighs. I am extremely happy with what I see today. In fact I feel like it is probably time for [name] to discontinue the use of the wound VAC as this appears to be doing so well. The patient voiced an understanding and agreement and is definitely happy to get rid of the wound VAC. She was  recently in the hospital but this was secondary to something unrelated to her wound she had a polyp which was bleeding and had to be taken care of this was causing significant issues she has since been taken off of her aspirin. 05/21/2021 upon evaluation today patient appears to be doing well with regard to her wound. She has been tolerating the dressing changes without complication with regard to the [facility] in the upper thigh region. We have been using Dakin's moistened gauze on the legs and the heel. Fortunately everything seems to be making good progress here in general which is great news. The unfortunate thing is that we have been having issues here with some necrotic tissue and drainage on the lateral portions of her legs. Fortunately there does not appear to be any signs of systemic infection although locally there may be a little bit of infection here based on the drainage which is somewhat blue-green in nature. I am going to perform a culture postdebridement of the right calf/lower extremity region. She tolerated that today without complication. I did perform that culture as well. 06/04/2021 upon evaluation today patient appears to be doing well with regard to the wounds on her thighs in general. Fortunately there does not appear to be  any signs of active infection which is great news I am very pleased in that regard. In regard to her lower extremities I do feel like that she is making some good progress here as well. This includes the use of the Dakin's moistened gauze which has done a great job as far as keeping the wounds clean at this point. Overall I am extremely happy with where we stand. 06/18/2021 upon evaluation today patient appears to be doing a little better in regard to most of her wounds in general. She does have a couple areas again and needs some debridement as far as the left lateral leg and the right heel are concerned. Otherwise she also has a small area of fluid on the right medial thigh where this is mostly healed but nonetheless there is a small opening that is of concern here. Fortunately there is no evidence of active infection at this time. No fevers, chills, nausea, vomiting, or diarrhea. 07/08/2021 upon evaluation today patient appears to be doing quite well in regard to her wounds on the right thigh region and left medial thigh region. That is about the extent of what seems to be doing good though everything else is at least the same if not a little bit worse unfortunately. Currently that includes the right lateral thigh where she has pus draining from this area we are probably need to culture this region. There is also some depth to the area where this is draining from. She also has significant wounds on the calf region right and left of the left especially starting to migrate and looking like it is getting worse. We been using Dakin's on this area. Her heel seems to be doing about the same there is really no significant improvement in general. 07/23/2021 upon evaluation today patient actually appears to be doing quite well in regard to her wounds in general. Most everything is actually showing signs of significant improvement. She did see plastic surgery, Dr. Claudia Desanctis yesterday. With that being said it appears  based on what the patient is telling me that he really did not feel there was anything from a surgical standpoint that he had to offer as far as treatment was concerned. Again this was part of  the reason that I sent her in order to determine whether or not there was anything that could be done or not. With that being said he did mention [facility] a [facility] to see if there is anything they had to offer but in general it sounds like that is probably not going to be the case. With all that being said the one area that we have an issue is on the left lateral thigh where unfortunately she is having a significant amount of purulent drainage there still some depth here I think we need to pack this area Hydrofera Blue rope will probably do well for her. 08/06/2021 upon evaluation today patient appears to be doing poorly in regard to her left leg in general. Unfortunately I think that she is going require light sharp debridement of the gluteal area but I think that this really is not doing too badly at all. With regard to the left leg however this is significantly worse than what I previously noted. I do believe that in fact the calf region is a lot deeper even than 2 weeks ago quite significantly and subsequently also think that she has a significant infection in the upper part of the thigh on the lateral/hip location where we noted an abscess before I did do a deep wound culture revealed multiple organisms nothing predominant. Subsequently coupled with the fact that she is having a lot of issues with pain in this area as well and still having purulent pus despite the doxycycline I think that it really has not treated the situation she probably has a deeper abscess that needs to be further managed more aggressively than what I can do in the outpatient setting. Objective Constitutional Well-nourished and well-hydrated in no acute distress. Vitals Time Taken: 11:50 AM, Height: 66  in, Weight: 235 lbs, BMI: 37.9, Temperature: 98.6 F, Pulse: 88 bpm, Respiratory Rate: 16 breaths/min, Blood Pressure: 163/90 mmHg. Respiratory normal breathing without difficulty. Psychiatric this patient is able to make decisions and demonstrates good insight into disease process. Alert and Oriented x 3. pleasant and cooperative. Amy Moses, Amy Moses ([id number]) General Notes: Upon inspection patient's wound bed actually showed signs of good healing in regard to the gluteal regions. I am pleased in this regard. With that being said I did perform light debridement over the gluteal areas in order to treat these regions I do not see anything abnormal in this area and I think that with appropriate care these will heal quite readily. Unfortunately in regard to the left leg and even the heel locations I really just do not feel like anything is doing much better and in fact the leg is significantly worse in regard to the calf region. I am also seeing significant issues with the left lateral hip/upper thigh location where unfortunately she seems to have a significant abscess with still purulent drainage despite being on doxycycline for 2 weeks I feel like this needs to be addressed more effectively and I think she really likely needs to be admitted to the hospital in order to get this under control. Integumentary (Hair, Skin) Wound #11 status is Open. Original cause of wound was Gradually Appeared. The date acquired was: 03/21/2021. The wound has been in treatment 17 weeks. The wound is located on the Right,Distal,Posterior Lower Leg. The wound measures 10.5cm length x 10cm width x 0.5cm depth; 82.467cm^2 area and 41.233cm^3 volume. There is a medium amount of serosanguineous drainage noted. Wound #14 status is Open. Original cause  of wound was Gradually Appeared. The date acquired was: 05/28/2021. The wound has been in treatment 9 weeks. The wound is located on the Right Gluteus. The wound measures 3.5cm  length x 2cm width x 0.2cm depth; 5.498cm^2 area and 1.1cm^3 volume. There is a medium amount of serosanguineous drainage noted. Wound #15 status is Open. Original cause of wound was Gradually Appeared. The date acquired was: 06/02/2021. The wound has been in treatment 7 weeks. The wound is located on the Left Gluteal fold. The wound measures 1cm length x 0.7cm width x 0.2cm depth; 0.55cm^2 area and 0.11cm^3 volume. There is a medium amount of serosanguineous drainage noted. Wound #16 status is Open. Original cause of wound was Pressure Injury. The date acquired was: 07/23/2021. The wound has been in treatment 2 weeks. The wound is located on the [place]. The wound measures 2.2cm length x 2.5cm width x 0.1cm depth; 4.32cm^2 area and 0.432cm^3 volume. There is a none present amount of drainage noted. Wound #17 status is Open. Original cause of wound was Trauma. The date acquired was: 07/22/2021. The wound is located on the Right,Posterior Upper Leg. The wound measures 3cm length x 4cm width x 0.2cm depth; 9.425cm^2 area and 1.885cm^3 volume. There is Fat Layer (Subcutaneous Tissue) exposed. There is no tunneling or undermining noted. There is a medium amount of serous drainage noted. There is no granulation within the wound bed. There is a large (67-100%) amount of necrotic tissue within the wound bed including Adherent Slough. Wound #2 status is Open. Original cause of wound was Gradually Appeared. The date acquired was: 07/22/2020. The wound has been in treatment 35 weeks. The wound is located on the Left,Medial Upper Leg. The wound measures 0cm length x 0cm width x 0cm depth; 0cm^2 area and 0cm^3 volume. There is a medium amount of serosanguineous drainage noted. Wound #3 status is Open. Original cause of wound was Gradually Appeared. The date acquired was: 07/22/2020. The wound has been in treatment 35 weeks. The wound is located on the Left,Lateral Upper Leg. The wound measures 2.5cm  length x 1cm width x 2.2cm depth; 1.963cm^2 area and 4.32cm^3 volume. There is a large amount of purulent drainage noted. Wound #4 status is Open. Original cause of wound was Gradually Appeared. The date acquired was: 10/21/2020. The wound has been in treatment 35 weeks. The wound is located on the Right Calcaneus. The wound measures 2.2cm length x 2.5cm width x 0.1cm depth; 4.32cm^2 area and 0.432cm^3 volume. There is a large amount of serosanguineous drainage noted. Wound #9 status is Open. Original cause of wound was Gradually Appeared. The date acquired was: 02/16/2021. The wound has been in treatment 24 weeks. The wound is located on the Left,Lateral Calf. The wound measures 16.5cm length x 14cm width x 3.2cm depth; 181.427cm^2 area and 580.566cm^3 volume. There is a large amount of serosanguineous drainage noted. 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 There was a Excisional Skin/Subcutaneous Tissue  Debridement with a total area of 7 sq cm performed by Tommie Sams., PA-C. With the following instrument(s): Curette to remove Viable and Non-Viable tissue/material. Material removed includes Subcutaneous Tissue, Slough, and Biofilm after achieving pain control using Lidocaine 4% Topical Solution. No specimens were taken. A time out was conducted at 12:40, prior to the start of  the procedure. A Minimum amount of Amy Moses, Amy Moses ([id number]) bleeding was controlled with Pressure. The procedure was tolerated well. Post Debridement Measurements: 3.5cm length x 2cm width x 0.2cm depth; 1.1cm^3 volume. Post debridement Stage noted as Category/Stage II. Character of Wound/Ulcer Post Debridement is improved. Post procedure Diagnosis Wound #14: Same as Pre-Procedure Wound #15 Pre-procedure diagnosis of Wound #15 is a Pressure Ulcer located on the Left Gluteal fold . There was a Excisional Skin/Subcutaneous Tissue Debridement with a total area of 0.7 sq cm performed by Tommie Sams., PA-C. With the following instrument(s): Curette Material removed includes Subcutaneous Tissue, Slough, and Biofilm after achieving pain control using Lidocaine 4% Topical Solution. No specimens were taken. A time out was conducted at 12:42, prior to the start of the procedure. A Minimum amount of bleeding was controlled with Pressure. The procedure was tolerated well. Post Debridement Measurements: 1cm length x 0.7cm width x 0.2cm depth; 0.11cm^3 volume. Post debridement Stage noted as Category/Stage III. Character of Wound/Ulcer Post Debridement is improved. 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 There was a Excisional Skin/Subcutaneous Tissue/Muscle Debridement with a total area of 24 sq cm performed by Tommie Sams., PA-C. With the following instrument(s): Curette to remove Viable and Non-Viable tissue/material. Material removed includes Muscle, Subcutaneous Tissue, Slough, and Biofilm after achieving pain control using Lidocaine 4% Topical Solution. No specimens were taken. A time out was conducted at 12:44, prior to the start of the procedure. A Minimum amount of bleeding was controlled with Pressure. The procedure was tolerated well. Post Debridement Measurements: 16.5cm  length x 14cm width x 3.2cm depth; 580.566cm^3 volume. Character of Wound/Ulcer Post Debridement is improved. Post procedure Diagnosis Wound #9: Same as Pre-Procedure 1. My suggestion currently to the patient was that we go ahead and see about making a referral to have her go to the ER for further evaluation and treatment. I think the sooner she can get there the better in my opinion. 2. I am also can recommend at this time that we have the patient go ahead and continue with the treatment options for the gluteal region. She has been using Hydrofera Blue here which I think is appropriate. With that being said I do not believe that the legs are doing nearly as well and I really think that she probably above all else needs to be seen by vascular although more importantly I think she really needs to go to the ER sooner due to the infection in the upper thigh and from there I really think the focus should be on getting the infection under control and subsequently consulting with vascular as well in order to see what is going on and if anything has deteriorated from a vascular standpoint. She previously has seen Dr. Oneida Alar for both her dialysis treatments as well as her legs. 3. With regard to follow-up we will definitely see her back when she gets out of the hospital and we will proceed from there as far as recommendations but at the time of this note I really think the best  thing is can be for her to go for ER evaluation due to the significant decline in both the posterior left calf as well as the left thigh abscess region which is again not responding to the oral antibiotics, doxycycline. Plan Follow-up Appointments: Return Appointment in 2 weeks. Home Health: Other Home Health Orders/Instructions: - Home Health Not Moses Off-Loading: Gel wheelchair cushion Air fluidized (Group 3) - stay in the bed except for meals. Turn and reposition every 2 hours - Recommend [employer] WHEN IN BED FOR  OFFLOADING PRESSURE OFF LEG WOUNDS Other: - keep pressure off of wounded areas. WOUND #11: - Lower Leg Wound Laterality: Right, Posterior, Distal Cleanser: Dakin 16 (oz) 0.25 1 x Per Day/30 Days Discharge Instructions: Use as directed. Primary Dressing: Gauze 1 x Per Day/30 Days Discharge Instructions: As directed: dry, moistened with saline or moistened with Dakins Solution Secondary Dressing: ABD Pad 5x9 (in/in) 1 x Per Day/30 Days Discharge Instructions: Cover with ABD pad Secured With: 17M Medipore H Soft Cloth Surgical Tape, 2x2 (in/yd) 1 x Per Day/30 Days Secured With: Conforming Stretch Gauze Bandage 4x75 (in/in) 1 x Per Day/30 Days Discharge Instructions: Apply as directed WOUND #14: - Gluteus Wound Laterality: Right Cleanser: Soap and Water 3 x Per Week/30 Days Discharge Instructions: Gently cleanse wound with antibacterial soap, rinse and pat dry prior to dressing wounds Primary Dressing: Hydrofera Blue Ready Transfer Foam, 2.5x2.5 (in/in) 3 x Per Week/30 Days Discharge Instructions: Apply Hydrofera Blue Ready to wound bed as directed Secondary Dressing: Zetuvit Plus Silicone Border Dressing 4x4 (in/in) 3 x Per Week/30 Days WOUND #15: - Gluteal fold Wound Laterality: Left Cleanser: Soap and Water 3 x Per Week/30 Days Discharge Instructions: Gently cleanse wound with antibacterial soap, rinse and pat dry prior to dressing wounds Primary Dressing: Hydrofera Blue Ready Transfer Foam, 2.5x2.5 (in/in) 3 x Per Week/30 Days Discharge Instructions: Apply Hydrofera Blue Ready to wound bed as directed Secondary Dressing: Zetuvit Plus Silicone Border Dressing 4x4 (in/in) 3 x Per Week/30 Days Amy, STEINIGER ([id number]) WOUND #16: - Foot Wound Laterality: Plantar, Left, Distal Cleanser: Soap and Water 3 x Per Week/30 Days Discharge Instructions: Gently cleanse wound with antibacterial soap, rinse and pat dry prior to dressing wounds Primary Dressing: Hydrofera Blue Ready Transfer Foam,  2.5x2.5 (in/in) 3 x Per Week/30 Days Discharge Instructions: Apply Hydrofera Blue Ready to wound bed as directed Secondary Dressing: ABD Pad 5x9 (in/in) 3 x Per Week/30 Days Discharge Instructions: Cover with ABD pad Secured With: 17M Medipore H Soft Cloth Surgical Tape, 2x2 (in/yd) 3 x Per Week/30 Days Secured With: [employer] Sterile or Non-Sterile 6-ply 4.5x4 (yd/yd) 3 x Per Week/30 Days Discharge Instructions: Apply Kerlix as directed WOUND #2: - Upper Leg Wound Laterality: Left, Medial Cleanser: Normal Saline 3 x Per Week/30 Days Discharge Instructions: Wash your hands with soap and water. Remove old dressing, discard into plastic bag and place into trash. Cleanse the wound with Normal Saline prior to applying a clean dressing using gauze sponges, not tissues or cotton balls. Do not scrub or use excessive force. Pat dry using gauze sponges, not tissue or cotton balls. Primary Dressing: Hydrofera Blue Ready Transfer Foam, 2.5x2.5 (in/in) 3 x Per Week/30 Days Discharge Instructions: Apply Hydrofera Blue Ready to wound bed as directed Secondary Dressing: ABD Pad 5x9 (in/in) 3 x Per Week/30 Days Discharge Instructions: Cover with ABD pad Secured With: 17M Medipore H Soft Cloth Surgical Tape, 2x2 (in/yd) 3 x Per Week/30 Days WOUND #3: - Upper Leg Wound Laterality: Left,  Lateral Cleanser: Normal Saline 3 x Per Week/30 Days Discharge Instructions: Wash your hands with soap and water. Remove old dressing, discard into plastic bag and place into trash. Cleanse the wound with Normal Saline prior to applying a clean dressing using gauze sponges, not tissues or cotton balls. Do not scrub or use excessive force. Pat dry using gauze sponges, not tissue or cotton balls. Primary Dressing: Hydrofera Blue Classic Foam Rope Dressing, 9x6 (mm/in) 3 x Per Week/30 Days Secondary Dressing: ABD Pad 5x9 (in/in) 3 x Per Week/30 Days Discharge Instructions: Cover with ABD pad Secured With: 30M Medipore H Soft  Cloth Surgical Tape, 2x2 (in/yd) 3 x Per Week/30 Days Secured With: Conforming Stretch Gauze Bandage 4x75 (in/in) 3 x Per Week/30 Days Discharge Instructions: Apply as directed WOUND #4: - Calcaneus Wound Laterality: Right Primary Dressing: Hydrofera Blue Ready Transfer Foam, 4x5 (in/in) 1 x Per Day/30 Days Discharge Instructions: Apply Hydrofera Blue Ready to wound bed as directed Secondary Dressing: ABD Pad 5x9 (in/in) 1 x Per Day/30 Days Discharge Instructions: Cover with ABD pad Secured With: 30M Medipore H Soft Cloth Surgical Tape, 2x2 (in/yd) 1 x Per Day/30 Days Secured With: Conforming Stretch Gauze Bandage 4x75 (in/in) 1 x Per Day/30 Days Discharge Instructions: Apply as directed WOUND #9: - Calf Wound Laterality: Left, Lateral Cleanser: Dakin 16 (oz) 0.25 1 x Per Day/30 Days Discharge Instructions: Use as directed. Primary Dressing: Hydrofera Blue Classic Foam Rope Dressing, 9x6 (mm/in) 1 x Per Day/30 Days Secondary Dressing: ABD Pad 5x9 (in/in) 1 x Per Day/30 Days Discharge Instructions: Cover with ABD pad Secured With: 30M Medipore H Soft Cloth Surgical Tape, 2x2 (in/yd) 1 x Per Day/30 Days Secured With: Conforming Stretch Gauze Bandage 4x75 (in/in) 1 x Per Day/30 Days Discharge Instructions: Apply as directed Electronic Signature(s) Signed: 08/06/2021 1:07:19 PM By: Worthy Keeler PA-C Entered By: Worthy Keeler on 08/06/2021 13:07:19 Amy Moses ([id number]) -------------------------------------------------------------------------------- SuperBill Details Patient Name: Amy Moses Date of Service: 08/06/2021 Medical Record Number: [id number] Patient Account Number: [id number] Date of Birth/Sex: February 25, 1971 (50 y.o. 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 L97.128 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 L89.610 Pressure ulcer of right heel, unstageable E11.42 Type 2 diabetes mellitus with diabetic polyneuropathy L89.313 Pressure ulcer of right buttock, stage 3 L89.323 Pressure ulcer of left buttock, stage 3 L97.822 Non-pressure chronic ulcer of other part of left lower leg with fat layer exposed L97.812 Non-pressure chronic ulcer of other part of right lower leg with fat layer exposed Facility Procedures CPT4 Code: [id number] Description: 11042 - DEB SUBQ TISSUE 20 SQ CM/< Modifier: Quantity: 1 CPT4 Code: Description: ICD-10 Diagnosis Description L89.313 Pressure ulcer of right buttock, stage 3 L89.323 Pressure ulcer of left buttock, stage 3 Modifier: Quantity: CPT4 Code: [id number] Description: [id number] - DEB MUSC/FASCIA 20 SQ CM/< Modifier: Quantity: 1 CPT4 Code: Description: ICD-10 Diagnosis Description L97.822 Non-pressure chronic ulcer of other part of left lower leg with fat layer Modifier: exposed Quantity: CPT4 Code: [id number] Description: 11046 - DEB MUSC/FASCIA EA ADDL 20 CM Modifier: Quantity: 1 CPT4 Code: Description: ICD-10 Diagnosis Description L97.822 Non-pressure chronic ulcer of other part of left lower leg with fat layer Modifier: exposed Quantity: Physician Procedures CPT4 Code: [id number] Description: [id number] - WC PHYS LEVEL 4 -  EST PT Modifier: 25 Quantity: 1 CPT4 Code: Description: ICD-10 Diagnosis Description E83.59 Other disorders of calcium metabolism L97.118 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 L97.128 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 L89.610  Pressure ulcer of right heel, unstageable Modifier: Quantity: CPT4 Code: [id number] Description: 11042 - WC PHYS SUBQ TISS 20 SQ CM Modifier: Quantity: 1 CPT4  Code: Description: ICD-10 Diagnosis Description L89.313 Pressure ulcer of right buttock, stage 3 L89.323 Pressure ulcer of left buttock, stage 3 Modifier: Quantity: CPT4 [id number] Description: 11043 - WC PHYS DEBR MUSCLE/FASCIA 20 SQ CM Modifier: Quantity: 1 CPT4 Code: Description: ICD-10 Diagnosis Description L97.822 Non-pressure chronic ulcer of other part of left lower leg with fat layer e Modifier: xposed Quantity: CPT4 Code: [id number] Colin Benton Description: 11046 - WC PHYS DEB MUSC/FASC EA ADDL 20 CM CA ([id number]) Modifier: Quantity: 1 Electronic Signature(s) Signed: 08/06/2021 1:08:32 PM By: Worthy Keeler PA-C Entered By: Worthy Keeler on 08/06/2021 13:08:31

## 2021-08-06 NOTE — ED Provider Notes (Signed)
Emergency Medicine Provider Triage Evaluation Note  Amy Moses , a 50 y.o. female  was evaluated in triage.  Pt complains of calciphylaxis.  Patient sees a wound care clinic, has an ulcer on her left thigh which has been worsening.  There has been purulent drainage and surrounding erythema, tried outpatient trial of doxycycline but did not improve.Durene Cal here by her wound specialist for IV antibiotics.  Dialysis Tuesday, Thursday, Saturday.  Review of Systems  Positive: WOUND Negative: Fevers, SOB  Physical Exam  BP (!) 153/84 (BP Location: Right Arm)   Pulse 85   Temp 98.7 F (37.1 C) (Oral)   Resp 16   LMP 08/22/2015 (Approximate)   SpO2 100%  Gen:   Awake, no distress   Resp:  Normal effort  MSK:   Moves extremities without difficulty  Other:  Wound dressed, deferred till in room.   Medical Decision Making  Medically screening exam initiated at 3:32 PM.  Appropriate orders placed.  [name] was informed that the remainder of the evaluation will be completed by another provider, this initial triage assessment does not replace that evaluation, and the importance of remaining in the ED until their evaluation is complete.  Will check labs and lactic. Patient likely will need IV abx given failed outpatient.    Sherrill Raring, PA-C 08/06/21 1534    Valarie Merino, MD 08/07/21 1759

## 2021-08-07 ENCOUNTER — Inpatient Hospital Stay (HOSPITAL_COMMUNITY): Payer: Medicare Other

## 2021-08-07 ENCOUNTER — Encounter (HOSPITAL_COMMUNITY): Payer: Self-pay | Admitting: Internal Medicine

## 2021-08-07 DIAGNOSIS — E1165 Type 2 diabetes mellitus with hyperglycemia: Secondary | ICD-10-CM | POA: Diagnosis present

## 2021-08-07 DIAGNOSIS — L97529 Non-pressure chronic ulcer of other part of left foot with unspecified severity: Secondary | ICD-10-CM | POA: Diagnosis not present

## 2021-08-07 DIAGNOSIS — L97223 Non-pressure chronic ulcer of left calf with necrosis of muscle: Secondary | ICD-10-CM | POA: Diagnosis not present

## 2021-08-07 DIAGNOSIS — L97129 Non-pressure chronic ulcer of left thigh with unspecified severity: Secondary | ICD-10-CM | POA: Diagnosis present

## 2021-08-07 DIAGNOSIS — I96 Gangrene, not elsewhere classified: Secondary | ICD-10-CM | POA: Diagnosis present

## 2021-08-07 DIAGNOSIS — L97211 Non-pressure chronic ulcer of right calf limited to breakdown of skin: Secondary | ICD-10-CM | POA: Diagnosis present

## 2021-08-07 DIAGNOSIS — I120 Hypertensive chronic kidney disease with stage 5 chronic kidney disease or end stage renal disease: Secondary | ICD-10-CM | POA: Diagnosis present

## 2021-08-07 DIAGNOSIS — E785 Hyperlipidemia, unspecified: Secondary | ICD-10-CM | POA: Diagnosis present

## 2021-08-07 DIAGNOSIS — T148XXA Other injury of unspecified body region, initial encounter: Secondary | ICD-10-CM | POA: Diagnosis not present

## 2021-08-07 DIAGNOSIS — I953 Hypotension of hemodialysis: Secondary | ICD-10-CM | POA: Diagnosis not present

## 2021-08-07 DIAGNOSIS — Z992 Dependence on renal dialysis: Secondary | ICD-10-CM | POA: Diagnosis not present

## 2021-08-07 DIAGNOSIS — N186 End stage renal disease: Secondary | ICD-10-CM

## 2021-08-07 DIAGNOSIS — H5461 Unqualified visual loss, right eye, normal vision left eye: Secondary | ICD-10-CM | POA: Diagnosis present

## 2021-08-07 DIAGNOSIS — E11621 Type 2 diabetes mellitus with foot ulcer: Secondary | ICD-10-CM | POA: Diagnosis present

## 2021-08-07 DIAGNOSIS — L97122 Non-pressure chronic ulcer of left thigh with fat layer exposed: Secondary | ICD-10-CM | POA: Diagnosis not present

## 2021-08-07 DIAGNOSIS — L03116 Cellulitis of left lower limb: Secondary | ICD-10-CM | POA: Diagnosis present

## 2021-08-07 DIAGNOSIS — M79662 Pain in left lower leg: Secondary | ICD-10-CM | POA: Diagnosis present

## 2021-08-07 DIAGNOSIS — L97519 Non-pressure chronic ulcer of other part of right foot with unspecified severity: Secondary | ICD-10-CM | POA: Diagnosis not present

## 2021-08-07 DIAGNOSIS — Z20822 Contact with and (suspected) exposure to covid-19: Secondary | ICD-10-CM | POA: Diagnosis present

## 2021-08-07 DIAGNOSIS — Z8249 Family history of ischemic heart disease and other diseases of the circulatory system: Secondary | ICD-10-CM | POA: Diagnosis not present

## 2021-08-07 DIAGNOSIS — E1122 Type 2 diabetes mellitus with diabetic chronic kidney disease: Secondary | ICD-10-CM | POA: Diagnosis present

## 2021-08-07 DIAGNOSIS — L97923 Non-pressure chronic ulcer of unspecified part of left lower leg with necrosis of muscle: Secondary | ICD-10-CM | POA: Diagnosis not present

## 2021-08-07 DIAGNOSIS — S91301D Unspecified open wound, right foot, subsequent encounter: Secondary | ICD-10-CM

## 2021-08-07 DIAGNOSIS — Z833 Family history of diabetes mellitus: Secondary | ICD-10-CM | POA: Diagnosis not present

## 2021-08-07 DIAGNOSIS — I10 Essential (primary) hypertension: Secondary | ICD-10-CM

## 2021-08-07 DIAGNOSIS — E039 Hypothyroidism, unspecified: Secondary | ICD-10-CM | POA: Diagnosis present

## 2021-08-07 DIAGNOSIS — L97229 Non-pressure chronic ulcer of left calf with unspecified severity: Secondary | ICD-10-CM | POA: Diagnosis present

## 2021-08-07 DIAGNOSIS — E1152 Type 2 diabetes mellitus with diabetic peripheral angiopathy with gangrene: Secondary | ICD-10-CM | POA: Diagnosis present

## 2021-08-07 DIAGNOSIS — L02416 Cutaneous abscess of left lower limb: Secondary | ICD-10-CM | POA: Diagnosis not present

## 2021-08-07 DIAGNOSIS — E11622 Type 2 diabetes mellitus with other skin ulcer: Secondary | ICD-10-CM | POA: Diagnosis present

## 2021-08-07 DIAGNOSIS — D631 Anemia in chronic kidney disease: Secondary | ICD-10-CM | POA: Diagnosis present

## 2021-08-07 DIAGNOSIS — L97419 Non-pressure chronic ulcer of right heel and midfoot with unspecified severity: Secondary | ICD-10-CM | POA: Diagnosis present

## 2021-08-07 DIAGNOSIS — Z7982 Long term (current) use of aspirin: Secondary | ICD-10-CM | POA: Diagnosis not present

## 2021-08-07 LAB — CBC
HCT: 35.9 % — ABNORMAL LOW (ref 36.0–46.0)
Hemoglobin: 10.6 g/dL — ABNORMAL LOW (ref 12.0–15.0)
MCH: 23.7 pg — ABNORMAL LOW (ref 26.0–34.0)
MCHC: 29.5 g/dL — ABNORMAL LOW (ref 30.0–36.0)
MCV: 80.3 fL (ref 80.0–100.0)
Platelets: 330 10*3/uL (ref 150–400)
RBC: 4.47 MIL/uL (ref 3.87–5.11)
RDW: 18 % — ABNORMAL HIGH (ref 11.5–15.5)
WBC: 10.6 10*3/uL — ABNORMAL HIGH (ref 4.0–10.5)
nRBC: 0 % (ref 0.0–0.2)

## 2021-08-07 LAB — C-REACTIVE PROTEIN: CRP: 11.1 mg/dL — ABNORMAL HIGH (ref <1.0)

## 2021-08-07 LAB — BASIC METABOLIC PANEL
Anion gap: 13 (ref 5–15)
BUN: 32 mg/dL — ABNORMAL HIGH (ref 6–20)
CO2: 26 mmol/L (ref 22–32)
Calcium: 9.3 mg/dL (ref 8.9–10.3)
Chloride: 94 mmol/L — ABNORMAL LOW (ref 98–111)
Creatinine, Ser: 4.12 mg/dL — ABNORMAL HIGH (ref 0.44–1.00)
GFR, Estimated: 13 mL/min — ABNORMAL LOW (ref >60)
Glucose, Bld: 302 mg/dL — ABNORMAL HIGH (ref 70–99)
Potassium: 4.4 mmol/L (ref 3.5–5.1)
Sodium: 133 mmol/L — ABNORMAL LOW (ref 135–145)

## 2021-08-07 LAB — GLUCOSE, CAPILLARY
Glucose-Capillary: 326 mg/dL — ABNORMAL HIGH (ref 70–99)
Glucose-Capillary: 227 mg/dL — ABNORMAL HIGH (ref 70–99)
Glucose-Capillary: 213 mg/dL — ABNORMAL HIGH (ref 70–99)
Glucose-Capillary: 348 mg/dL — ABNORMAL HIGH (ref 70–99)

## 2021-08-07 LAB — RESP PANEL BY RT-PCR (FLU A&B, COVID) ARPGX2
Influenza A by PCR: NEGATIVE
Influenza B by PCR: NEGATIVE
SARS Coronavirus 2 by RT PCR: NEGATIVE

## 2021-08-07 LAB — HEMOGLOBIN A1C
Hgb A1c MFr Bld: 9.5 % — ABNORMAL HIGH (ref 4.8–5.6)
Mean Plasma Glucose: 225.95 mg/dL

## 2021-08-07 LAB — SEDIMENTATION RATE: Sed Rate: 40 mm/hr — ABNORMAL HIGH (ref 0–22)

## 2021-08-07 LAB — PREALBUMIN: Prealbumin: 13.7 mg/dL — ABNORMAL LOW (ref 18–38)

## 2021-08-07 LAB — PHOSPHORUS: Phosphorus: 5.5 mg/dL — ABNORMAL HIGH (ref 2.5–4.6)

## 2021-08-07 LAB — HIV ANTIBODY (ROUTINE TESTING W REFLEX): HIV Screen 4th Generation wRfx: NONREACTIVE

## 2021-08-07 MED ORDER — POLYETHYLENE GLYCOL 3350 17 G PO PACK
17.0000 g | PACK | Freq: Every day | ORAL | Status: DC | PRN
  Administered 2021-08-08: 17 g via ORAL
  Filled 2021-08-07: qty 1

## 2021-08-07 MED ORDER — TORSEMIDE 20 MG PO TABS
150.0000 mg | ORAL_TABLET | Freq: Every day | ORAL | Status: DC
  Administered 2021-08-07 – 2021-08-12 (×6): 150 mg via ORAL
  Filled 2021-08-07 (×6): qty 1

## 2021-08-07 MED ORDER — INSULIN GLARGINE-YFGN 100 UNIT/ML ~~LOC~~ SOLN
5.0000 [IU] | Freq: Every day | SUBCUTANEOUS | Status: DC
  Administered 2021-08-07 – 2021-08-08 (×3): 5 [IU] via SUBCUTANEOUS
  Filled 2021-08-07 (×4): qty 0.05

## 2021-08-07 MED ORDER — ASPIRIN EC 81 MG PO TBEC
81.0000 mg | DELAYED_RELEASE_TABLET | Freq: Every day | ORAL | Status: DC
  Administered 2021-08-07 – 2021-08-12 (×6): 81 mg via ORAL
  Filled 2021-08-07 (×6): qty 1

## 2021-08-07 MED ORDER — METOPROLOL SUCCINATE ER 50 MG PO TB24
50.0000 mg | ORAL_TABLET | Freq: Every day | ORAL | Status: DC
  Administered 2021-08-07: 50 mg via ORAL
  Filled 2021-08-07 (×5): qty 1

## 2021-08-07 MED ORDER — LEVOTHYROXINE SODIUM 50 MCG PO TABS
50.0000 ug | ORAL_TABLET | Freq: Every day | ORAL | Status: DC
  Administered 2021-08-07 – 2021-08-12 (×6): 50 ug via ORAL
  Filled 2021-08-07 (×6): qty 1

## 2021-08-07 MED ORDER — FENTANYL CITRATE PF 50 MCG/ML IJ SOSY: 12.5000 ug | PREFILLED_SYRINGE | INTRAMUSCULAR | Status: DC | PRN

## 2021-08-07 MED ORDER — ONDANSETRON HCL 4 MG/2ML IJ SOLN: 4.0000 mg | Freq: Four times a day (QID) | INTRAMUSCULAR | Status: DC | PRN

## 2021-08-07 MED ORDER — INSULIN ASPART 100 UNIT/ML IJ SOLN
0.0000 [IU] | Freq: Three times a day (TID) | INTRAMUSCULAR | Status: DC
  Administered 2021-08-07: 4 [IU] via SUBCUTANEOUS
  Administered 2021-08-07 – 2021-08-08 (×3): 2 [IU] via SUBCUTANEOUS
  Administered 2021-08-08 – 2021-08-09 (×2): 3 [IU] via SUBCUTANEOUS
  Administered 2021-08-09: 4 [IU] via SUBCUTANEOUS

## 2021-08-07 MED ORDER — PANTOPRAZOLE SODIUM 40 MG PO TBEC
40.0000 mg | DELAYED_RELEASE_TABLET | Freq: Two times a day (BID) | ORAL | Status: DC
  Administered 2021-08-07 – 2021-08-12 (×11): 40 mg via ORAL
  Filled 2021-08-07: qty 1
  Filled 2021-08-07: qty 2
  Filled 2021-08-07 (×9): qty 1

## 2021-08-07 MED ORDER — METRONIDAZOLE 500 MG PO TABS
500.0000 mg | ORAL_TABLET | Freq: Two times a day (BID) | ORAL | Status: DC
Start: 2021-08-07 — End: 2021-08-10
  Administered 2021-08-07 – 2021-08-09 (×7): 500 mg via ORAL
  Filled 2021-08-07 (×7): qty 1

## 2021-08-07 MED ORDER — HEPARIN SODIUM (PORCINE) 5000 UNIT/ML IJ SOLN
5000.0000 [IU] | Freq: Three times a day (TID) | INTRAMUSCULAR | Status: DC
  Administered 2021-08-07 – 2021-08-12 (×15): 5000 [IU] via SUBCUTANEOUS
  Filled 2021-08-07 (×16): qty 1

## 2021-08-07 MED ORDER — SODIUM CHLORIDE 0.9 % IV SOLN
2.0000 g | INTRAVENOUS | Status: DC
  Administered 2021-08-07 – 2021-08-12 (×6): 2 g via INTRAVENOUS
  Filled 2021-08-07 (×6): qty 20

## 2021-08-07 MED ORDER — PIPERACILLIN-TAZOBACTAM 3.375 G IVPB 30 MIN
3.3750 g | Freq: Once | INTRAVENOUS | Status: AC
  Administered 2021-08-07: 3.375 g via INTRAVENOUS
  Filled 2021-08-07: qty 50

## 2021-08-07 MED ORDER — INSULIN ASPART 100 UNIT/ML IJ SOLN
2.0000 [IU] | Freq: Three times a day (TID) | INTRAMUSCULAR | Status: DC
  Administered 2021-08-07 – 2021-08-09 (×6): 2 [IU] via SUBCUTANEOUS

## 2021-08-07 MED ORDER — CHLORHEXIDINE GLUCONATE CLOTH 2 % EX PADS
6.0000 | MEDICATED_PAD | Freq: Every day | CUTANEOUS | Status: DC
  Administered 2021-08-08 – 2021-08-12 (×5): 6 via TOPICAL

## 2021-08-07 MED ORDER — ACETAMINOPHEN 650 MG RE SUPP: 650.0000 mg | Freq: Four times a day (QID) | RECTAL | Status: DC | PRN

## 2021-08-07 MED ORDER — ONDANSETRON HCL 4 MG PO TABS: 4.0000 mg | ORAL_TABLET | Freq: Four times a day (QID) | ORAL | Status: DC | PRN

## 2021-08-07 MED ORDER — ACETAMINOPHEN 325 MG PO TABS: 650.0000 mg | ORAL_TABLET | Freq: Four times a day (QID) | ORAL | Status: DC | PRN

## 2021-08-07 MED ORDER — VANCOMYCIN HCL IN DEXTROSE 1-5 GM/200ML-% IV SOLN
1000.0000 mg | INTRAVENOUS | Status: DC
  Filled 2021-08-07 (×3): qty 200

## 2021-08-07 MED ORDER — VANCOMYCIN HCL 2000 MG/400ML IV SOLN
2000.0000 mg | Freq: Once | INTRAVENOUS | Status: AC
  Administered 2021-08-07: 2000 mg via INTRAVENOUS
  Filled 2021-08-07: qty 400

## 2021-08-07 MED ORDER — SEVELAMER CARBONATE 800 MG PO TABS
800.0000 mg | ORAL_TABLET | Freq: Three times a day (TID) | ORAL | Status: DC
  Administered 2021-08-07 – 2021-08-12 (×10): 800 mg via ORAL
  Filled 2021-08-07 (×13): qty 1

## 2021-08-07 MED ORDER — ATORVASTATIN CALCIUM 10 MG PO TABS
10.0000 mg | ORAL_TABLET | Freq: Every day | ORAL | Status: DC
  Administered 2021-08-07 – 2021-08-11 (×5): 10 mg via ORAL
  Filled 2021-08-07 (×5): qty 1

## 2021-08-07 NOTE — ED Notes (Signed)
Old leg wound dressings removed by this RN and EDP to examine wounds. Wounds re-dressed with ABD pads and kerlix roll by this RN and EDP.

## 2021-08-07 NOTE — ED Notes (Signed)
Pt given sandwich bag and diet ginger-- OK to eat by EDP.

## 2021-08-07 NOTE — ED Provider Notes (Signed)
[facility] EMERGENCY DEPARTMENT Provider Note   CSN: [id number] Arrival date & time: 08/06/21  1430     History Chief Complaint  Patient presents with   Wound Infection    Amy Moses is a 50 y.o. female.  The history is provided by the patient and medical records.  Amy Moses is a 50 y.o. female who presents to the Emergency Department complaining of wound infection. She presents the emergency department upon referral from the wound center for evaluation of progressive wound infections to the left leg. She has a history of ESR D and dialysis Tuesday, Thursday, Saturday. She also has a history of calcific boxes and progressive wounds to bilateral lower extremities. About two weeks ago she was started on doxycycline for superficial wound infections to the left lateral thigh and left calf. Since that time she has been experienced increased drainage from the area. She denies any fevers, chills, nausea, vomiting, pain. When she was seen the wound center today she was recommended for referral to the emergency department. Per recommendations they would like vascular evaluation and IV antibiotics. She does report increased swelling to the left leg.    Past Medical History:  Diagnosis Date   Anemia    Blindness of right eye with low vision in contralateral eye    s/p victrectomy   Diabetes mellitus, type II ([place])    Dyslipidemia    Glaucoma    Hypertension    Hypothyroidism (acquired)    Kidney disease    Stage 5   Pneumonia     Patient Active Problem List   Diagnosis Date Noted   Lower GI bleed 05/12/2021   Blindness 05/10/2021   Shingles 05/10/2021   Acute GI bleeding 04/24/2021   Acute blood loss anemia 04/23/2021   GI bleed 04/22/2021   Fever 03/31/2021   Acute on chronic heart failure with preserved ejection fraction (HFpEF) ([place]) 03/30/2021   Pressure injury of skin 03/30/2021   End stage renal disease ([place]) 11/16/2020   Calciphylaxis  11/06/2020   Non-healing open wound of heel 11/03/2020   Diabetic foot infection ([place]) 11/01/2020   Decubitus ulcer, heel 11/01/2020   Closed nondisplaced fracture of left patella 76/19/5093   Metabolic acidosis 26/71/2458   Acute on chronic renal failure ([place]) 06/10/2020   Symptomatic anemia 06/10/2020   Acute pericardial effusion 06/10/2020   Other acute nonsuppurative otitis media, unspecified ear 11/29/2019   Chronic kidney disease, stage 4 (severe) ([place]) 03/05/2019   Skin ulcer, limited to breakdown of skin ([place]) 01/28/2019   Vitamin D deficiency 01/28/2019   Uncontrolled type 2 diabetes mellitus with chronic kidney disease, with long-term current use of insulin ([place]) 09/21/2015   Hyperlipidemia 09/21/2015   Essential hypertension, benign 09/21/2015   Primary hypothyroidism 09/21/2015   Iris bomb 07/31/2012   Secondary angle-closure glaucoma 07/31/2012    Past Surgical History:  Procedure Laterality Date   ABDOMINAL AORTOGRAM W/LOWER EXTREMITY Bilateral 12/18/2020   Procedure: ABDOMINAL AORTOGRAM W/LOWER EXTREMITY;  Surgeon: Elam Dutch, MD;  Location: [facility];  Service: Cardiovascular;  Laterality: Bilateral;   ANKLE FRACTURE SURGERY     AV FISTULA PLACEMENT Left 08/18/2020   Procedure: LEFT ARM BRACHIOCEPHALIC ARTERIOVENOUS (AV) FISTULA CREATION;  Surgeon: Elam Dutch, MD;  Location: [facility];  Service: Vascular;  Laterality: Left;   BIOPSY  04/24/2021   Procedure: BIOPSY;  Surgeon: Eloise Harman, DO;  [facility];  Service: Endoscopy;;   CESAREAN SECTION     CHOLECYSTECTOMY  COLONOSCOPY  04/24/2021   Surgeon: Eloise Harman, DO;  nonbleeding internal hemorrhoids, 1 large (25 mm) pedunculated transverse colon polyp (prolapse type polyp) with adherent clot and stigmata of recent bleed.   COLONOSCOPY WITH PROPOFOL N/A 05/14/2021   Procedure: COLONOSCOPY WITH PROPOFOL;  Surgeon: Daneil Dolin, MD;  [facility];  Service:  Endoscopy;  Laterality: N/A;   ESOPHAGOGASTRODUODENOSCOPY (EGD) WITH PROPOFOL N/A 04/24/2021   Surgeon: Eloise Harman, DO;  duodenal erosions and gastritis biopsied (pathology with peptic duodenitis, reactive gastropathy with erosions/chronic inflammation, negative for H. pylori)   EYE SURGERY     Vatrectomy   HEMOSTASIS CLIP PLACEMENT  05/14/2021   Procedure: HEMOSTASIS CLIP PLACEMENT;  Surgeon: Daneil Dolin, MD;  [facility];  Service: Endoscopy;;   IR PERC TUN PERIT CATH WO PORT S&I /IMAG  09/15/2020   IR REMOVAL TUN CV CATH W/O FL  02/19/2021   IR US GUIDE VASC ACCESS RIGHT  09/15/2020   POLYPECTOMY  04/24/2021   Procedure: POLYPECTOMY;  Surgeon: Eloise Harman, DO;  [facility];  Service: Endoscopy;;   POLYPECTOMY  05/14/2021   Procedure: POLYPECTOMY;  Surgeon: Daneil Dolin, MD;  [facility];  Service: Endoscopy;;   TOE SURGERY       OB History   No obstetric history on file.     Family History  Problem Relation Age of Onset   Heart disease Mother    Diabetes Mother    Kidney disease Mother    Diabetes Father    Heart disease Father    Diabetes Brother    Colon cancer Neg Hx     Social History   Tobacco Use   Smoking status: Never   Smokeless tobacco: Never  Vaping Use   Vaping Use: Never used  Substance Use Topics   Alcohol use: No   Drug use: No    Home Medications Prior to Admission medications   Medication Sig Start Date End Date Taking? Authorizing Provider  acetaminophen (TYLENOL) 500 MG tablet Take 1,000 mg by mouth every 6 (six) hours as needed for moderate pain.    [provider]  aspirin EC 81 MG tablet Take 1 tablet (81 mg total) by mouth daily with breakfast. 05/19/21 05/19/22  Johnson, Clanford L, MD  atorvastatin (LIPITOR) 10 MG tablet Take 1 tablet (10 mg total) by mouth daily. 04/25/21   Roxan Hockey, MD  camphor-menthol [facility]) lotion Apply 1 application topically as needed for itching.  05/17/21   Johnson, Clanford L, MD  cholestyramine light (PREVALITE) 4 g packet Take 1 packet (4 g total) by mouth 2 (two) times daily. 05/19/21 11/15/21  Eloise Harman, DO  HUMALOG KWIKPEN 100 UNIT/ML KwikPen Inject 2 Units into the skin 3 (three) times daily with meals. If eats 50% or more of meal. 05/15/21   Johnson, Clanford L, MD  insulin glargine (LANTUS) 100 UNIT/ML injection Inject 0.05 mLs (5 Units total) into the skin at bedtime. 05/17/21   Murlean Iba, MD  levothyroxine (SYNTHROID) 50 MCG tablet Take 1 tablet (50 mcg total) by mouth daily before breakfast. 04/25/21   Roxan Hockey, MD  metoprolol succinate (TOPROL-XL) 50 MG 24 hr tablet Take 1 tablet (50 mg total) by mouth at bedtime. Take with or immediately following a meal. 04/25/21   Emokpae, Courage, MD  pantoprazole (PROTONIX) 40 MG tablet Take 1 tablet (40 mg total) by mouth 2 (two) times daily. 04/25/21 04/25/22  Roxan Hockey, MD  polyethylene glycol (MIRALAX) 17 g packet Take 17 g by mouth daily as needed for mild constipation. 05/11/21   Barton Dubois, MD  sevelamer carbonate (RENVELA) 800 MG tablet Take 1 tablet (800 mg total) by mouth 3 (three) times daily with meals. 04/25/21   Roxan Hockey, MD  torsemide (DEMADEX) 100 MG tablet Take 150 mg by mouth daily.  05/22/20   [provider]  Vitamin D, Ergocalciferol, (DRISDOL) 1.25 MG (50000 UNIT) CAPS capsule Take 1 capsule by mouth every 14 (fourteen) days. 04/20/21   [provider]    Allergies    Ace inhibitors  Review of Systems   Review of Systems  All other systems reviewed and are negative.  Physical Exam Updated Vital Signs BP 131/69   Pulse 81   Temp 97.7 F (36.5 C) (Oral)   Resp 18   Ht _0  (1.676 m)   Wt 95.3 kg   LMP 08/22/2015 (Approximate)   SpO2 97%   BMI 33.89 kg/m   Physical Exam Vitals and nursing note reviewed.  Constitutional:      Appearance: She is well-developed.  HENT:     Head: Normocephalic and atraumatic.   Cardiovascular:     Rate and Rhythm: Normal rate and regular rhythm.  Pulmonary:     Effort: Pulmonary effort is normal. No respiratory distress.  Abdominal:     Palpations: Abdomen is soft.     Tenderness: There is no abdominal tenderness. There is no guarding or rebound.  Musculoskeletal:        General: No tenderness.     Comments: 2+ DP pulses bilaterally. There is 2+ edema to bilateral lower extremities. There is induration and scarring to bilateral thighs, which patient reports is chronic. There is a wound to the left lateral thigh and left posterior calf with moderate amount of yellow and green purulent exudate  Skin:    General: Skin is warm and dry.  Neurological:     Mental Status: She is alert and oriented to person, place, and time.  Psychiatric:        Behavior: Behavior normal.       ED Results / Procedures / Treatments   Labs (all labs ordered are listed, but only abnormal results are displayed) Labs Reviewed  COMPREHENSIVE METABOLIC PANEL - Abnormal; Notable for the following components:      Result Value   Sodium 134 (*)    Chloride 95 (*)    Glucose, Bld 277 (*)    BUN 29 (*)    Creatinine, Ser 3.78 (*)    Total Protein <3.0 (*)    Albumin 2.4 (*)    AST 10 (*)    GFR, Estimated 14 (*)    All other components within normal limits  CBC WITH DIFFERENTIAL/PLATELET - Abnormal; Notable for the following components:   WBC 12.7 (*)    Hemoglobin 10.6 (*)    MCH 23.7 (*)    MCHC 29.4 (*)    RDW 17.8 (*)    Neutro Abs 9.8 (*)    Monocytes Absolute 1.4 (*)    All other components within normal limits  CULTURE, BLOOD (ROUTINE X 2)  CULTURE, BLOOD (ROUTINE X 2)  RESP PANEL BY RT-PCR (FLU A&B, COVID) ARPGX2  LACTIC ACID, PLASMA  LACTIC ACID, PLASMA  CBC WITH DIFFERENTIAL/PLATELET    EKG EKG Interpretation  Date/Time:  Saturday August 07 2021 01:53:40 EDT Ventricular Rate:  84 PR Interval:  123 QRS Duration: 86 QT Interval:  372 QTC  Calculation: 440 R Axis:   15 Text Interpretation: Sinus rhythm Probable left atrial enlargement Low voltage, extremity leads Nonspecific T abnormalities, lateral leads Confirmed by Quintella Reichert [phone]) on 08/07/2021 1:55:46 AM  Radiology No results found.  Procedures Procedures   Medications Ordered in ED Medications  piperacillin-tazobactam (ZOSYN) IVPB 3.375 g (3.375 g Intravenous New Bag/Given 08/07/21 0256)  vancomycin (VANCOREADY) IVPB 2000 mg/400 mL (has no administration in time range)    ED Course  I have reviewed the triage vital signs and the nursing notes.  Pertinent labs & imaging results that were available during my care of the patient were reviewed by me and considered in my medical decision making (see chart for details).    MDM Rules/Calculators/A&P                          patient with history of ESR D on hemodialysis here for evaluation of increased wound drainage to the left lower extremity despite being on antibiotics for two weeks. Wounds appear to have superinfection. She is well perfused throughout the left leg. CBC with leukocytosis. No evidence of sepsis by history and exam. Will start on antibiotics. Medicine consulted for admission for ongoing treatment  Final Clinical Impression(s) / ED Diagnoses Final diagnoses:  Wound infection    Rx / DC Orders ED Discharge Orders     None        Quintella Reichert, MD 08/07/21 [phone]

## 2021-08-07 NOTE — Assessment & Plan Note (Addendum)
--   Hemodialysis per nephrology.

## 2021-08-07 NOTE — Consult Note (Signed)
Renal Service Consult Note [name]  Amy Moses 08/07/2021 Sol Blazing, MD Requesting Physician: Dr. Sarajane Jews  Reason for Consult: ESRD pt w/ infected leg wounds HPI: The patient is a 50 y.o. year-old w/ hx of anemia, DM2, blind R eye, HL, HTN, glaucoma, hypothroid and ESRD on HD in [place], [place]. Started HD in Nov 2021. She has been suffering from calciphylaxis related bilat LE wounds for over a year. She says she had it for a "long time" before someone was able to diagnosis it. She is on HD TTS schedule in [place]. She was getting a medication for the wounds and her upper ant thighs healed up. The medication (sod thiosulfate? pt not sure of the name) was then stopped by her doctors in [place] since she was on it for such a long time. She said they may resume it in December. Pt went to wound clinic yesterday and due to infected appearing wounds in the lower legs, she was directed to go to ED. Pt was admitted and IV abx started.  We are asked to see for esrd.    Pt seen in room, no SOB or cough. Mild ^swelling of the upper legs.    ROS - denies CP, no joint pain, no HA, no blurry vision, no rash, no diarrhea, no nausea/ vomiting, no dysuria, no difficulty voiding   Past Medical History  Past Medical History:  Diagnosis Date   Anemia    Blindness of right eye with low vision in contralateral eye    s/p victrectomy   Diabetes mellitus, type II ([place])    Dyslipidemia    Glaucoma    Hypertension    Hypothyroidism (acquired)    Kidney disease    Stage 5   Pneumonia    Past Surgical History  Past Surgical History:  Procedure Laterality Date   ABDOMINAL AORTOGRAM W/LOWER EXTREMITY Bilateral 12/18/2020   Procedure: ABDOMINAL AORTOGRAM W/LOWER EXTREMITY;  Surgeon: Elam Dutch, MD;  Location: [facility];  Service: Cardiovascular;  Laterality: Bilateral;   ANKLE FRACTURE SURGERY     AV FISTULA PLACEMENT Left 08/18/2020   Procedure: LEFT ARM  BRACHIOCEPHALIC ARTERIOVENOUS (AV) FISTULA CREATION;  Surgeon: Elam Dutch, MD;  Location: [facility];  Service: Vascular;  Laterality: Left;   BIOPSY  04/24/2021   Procedure: BIOPSY;  Surgeon: Eloise Harman, DO;  [facility];  Service: Endoscopy;;   CESAREAN SECTION     CHOLECYSTECTOMY     COLONOSCOPY  04/24/2021   Surgeon: Eloise Harman, DO;  nonbleeding internal hemorrhoids, 1 large (25 mm) pedunculated transverse colon polyp (prolapse type polyp) with adherent clot and stigmata of recent bleed.   COLONOSCOPY WITH PROPOFOL N/A 05/14/2021   Procedure: COLONOSCOPY WITH PROPOFOL;  Surgeon: Daneil Dolin, MD;  [facility];  Service: Endoscopy;  Laterality: N/A;   ESOPHAGOGASTRODUODENOSCOPY (EGD) WITH PROPOFOL N/A 04/24/2021   Surgeon: Eloise Harman, DO;  duodenal erosions and gastritis biopsied (pathology with peptic duodenitis, reactive gastropathy with erosions/chronic inflammation, negative for H. pylori)   EYE SURGERY     Vatrectomy   HEMOSTASIS CLIP PLACEMENT  05/14/2021   Procedure: HEMOSTASIS CLIP PLACEMENT;  Surgeon: Daneil Dolin, MD;  [facility];  Service: Endoscopy;;   IR PERC TUN PERIT CATH WO PORT S&I /IMAG  09/15/2020   IR REMOVAL TUN CV CATH W/O FL  02/19/2021   IR US GUIDE VASC ACCESS RIGHT  09/15/2020   POLYPECTOMY  04/24/2021  Procedure: POLYPECTOMY;  Surgeon: Eloise Harman, DO;  [facility];  Service: Endoscopy;;   POLYPECTOMY  05/14/2021   Procedure: POLYPECTOMY;  Surgeon: Daneil Dolin, MD;  [facility];  Service: Endoscopy;;   TOE SURGERY     Family History  Family History  Problem Relation Age of Onset   Heart disease Mother    Diabetes Mother    Kidney disease Mother    Diabetes Father    Heart disease Father    Diabetes Brother    Colon cancer Neg Hx    Social History  reports that she has never smoked. She has never used smokeless tobacco. She reports that she does not drink  alcohol and does not use drugs. Allergies  Allergies  Allergen Reactions   Ace Inhibitors Cough   Home medications Prior to Admission medications   Medication Sig Start Date End Date Taking? Authorizing Provider  acetaminophen (TYLENOL) 500 MG tablet Take 1,000 mg by mouth every 6 (six) hours as needed for moderate pain.   Yes [provider]  aspirin EC 81 MG tablet Take 1 tablet (81 mg total) by mouth daily with breakfast. 05/19/21 05/19/22 Yes Johnson, Clanford L, MD  atorvastatin (LIPITOR) 10 MG tablet Take 1 tablet (10 mg total) by mouth daily. Patient taking differently: Take 10 mg by mouth at bedtime. 04/25/21  Yes Roxan Hockey, MD  camphor-menthol [facility]) lotion Apply 1 application topically as needed for itching. 05/17/21  Yes Johnson, Clanford L, MD  cholestyramine light (PREVALITE) 4 g packet Take 1 packet (4 g total) by mouth 2 (two) times daily. Patient taking differently: Take 4 g by mouth once a week. 05/19/21 11/15/21 Yes Carver, Charles K, DO  HUMALOG KWIKPEN 100 UNIT/ML KwikPen Inject 2 Units into the skin 3 (three) times daily with meals. If eats 50% or more of meal. 05/15/21  Yes Johnson, Clanford L, MD  insulin glargine (LANTUS) 100 UNIT/ML injection Inject 0.05 mLs (5 Units total) into the skin at bedtime. 05/17/21  Yes Johnson, Clanford L, MD  levothyroxine (SYNTHROID) 50 MCG tablet Take 1 tablet (50 mcg total) by mouth daily before breakfast. 04/25/21  Yes Emokpae, Courage, MD  metoprolol succinate (TOPROL-XL) 50 MG 24 hr tablet Take 1 tablet (50 mg total) by mouth at bedtime. Take with or immediately following a meal. 04/25/21  Yes Emokpae, Courage, MD  pantoprazole (PROTONIX) 40 MG tablet Take 1 tablet (40 mg total) by mouth 2 (two) times daily. 04/25/21 04/25/22 Yes Emokpae, Courage, MD  polyethylene glycol (MIRALAX) 17 g packet Take 17 g by mouth daily as needed for mild constipation. 05/11/21  Yes Barton Dubois, MD  sevelamer carbonate (RENVELA) 800 MG tablet Take 1  tablet (800 mg total) by mouth 3 (three) times daily with meals. 04/25/21  Yes Emokpae, Courage, MD  torsemide (DEMADEX) 100 MG tablet Take 150 mg by mouth daily.  05/22/20  Yes [provider]  Vitamin D, Ergocalciferol, (DRISDOL) 1.25 MG (50000 UNIT) CAPS capsule Take 50,000 Units by mouth every 14 (fourteen) days. 04/20/21  Yes [provider]     Vitals:   08/07/21 0500 08/07/21 0516 08/07/21 0550 08/07/21 0700  BP: 125/69  140/79 140/69  Pulse: 76  81 81  Resp: '17  16 18  '$ Temp:  98 F (36.7 C) 98 F (36.7 C) 98.5 F (36.9 C)  TempSrc:  Oral Oral Oral  SpO2: 97%  95% 99%  Weight:      Height:  Exam Gen alert, no distress No rash, cyanosis or gangrene Sclera anicteric, throat clear  No jvd or bruits Chest clear bilat to bases, no rales/ wheezing RRR no MRG Abd soft ntnd no mass or ascites +bs GU normal MS no joint effusions or deformity Ext bilat firm edema/ thick skin texture in bilat legs from hips to feet, although lower legsb bilateral are wrapped; healed over wounds in R groin/ thigh, same in L groin/ thigh; lateral L thigh is bandaged Neuro is alert, Ox 3 , nf LUA AVF +bruit      Home meds include asa, lipitor 10 , humalog , lantus insulin, synthroid, metoprolol xl, protonix, renvela 800 tid, demadex 150 qd, vitmains, prns     OP HD: TTS [place] [facility]  [phone])  Dry wt around 88-89 kg per the patient   Assessment/ Plan: Infected chronic leg wounds - per primary team, on IV abx H/o calciphylaxis - is not getting any Ca/ Vit D products. Will contact HD unit Monday to see if should be getting Na thiosulfate or not.  ESRD - on HD TTS. HD orders written for today, but likely will not get Hd until tomorrow.  BP/ volume - not sure dry wt, +LE edema, UF 3 L as tol w/ next HD HTN - cont meds DM2 - per pmd Anemia ckd - Hb 10-11, follow MBD ckd - cont renvela as binder, get records monday      Rob Jonnie Finner  MD 08/07/2021, 3:56  PM  Recent Labs  Lab 08/06/21 1710 08/07/21 0411  WBC 12.7* 10.6*  HGB 10.6* 10.6*   Recent Labs  Lab 08/06/21 1532 08/07/21 0411  K 3.8 4.4  BUN 29* 32*  CREATININE 3.78* 4.12*  CALCIUM 9.4 9.3  PHOS  --  5.5*

## 2021-08-07 NOTE — Assessment & Plan Note (Addendum)
--   Bilateral lower extremity wounds, nonhealing ulcer left calf, infected ulcer left thigh.  Followed by wound care center.  Recently seen by plastic surgery as an outpatient, no plans for intervention but referral was considered to tertiary care center. --Seen by vascular surgery here and orthopedics.  Recommended to continue wound care, antibiotics and consider tertiary care treatment team as an outpatient.  ABIs not possible.

## 2021-08-07 NOTE — H&P (Signed)
Hospital Consult    Reason for Consult: Calciphylaxis bilateral lower extremity wounds Requesting Physician: Dr. Murray Hodgkins MRN #:  [id number]  History of Present Illness: This is a 50 y.o. female with past medical history of end-stage renal disease, calciphylaxis, nonhealing lower extremity wounds.  She was sent to the emergency department after her wound care center voiced concern for infection on the lateral aspect of the left thigh.  Vascular surgery was consulted for treatment options after nonpalpable pulses were appreciated in the bilateral lower extremities.  Amy Moses was first diagnosed with end-stage renal disease roughly 1.5 years ago.  She first appreciated wounds on bilateral thighs a month after starting dialysis and was diagnosed with calciphylaxis.  At that time she was started on sodium thiosulfate and the thigh wounds wounds were VACd.  These healed over the course of several months.  Not long after being diagnosed with calciphylaxis, the patient fractured her patella.  This was not fixed due to her diagnosis and concern for nonhealing.  Since November, Amy Moses has been nonambulatory due to the fracture.  She was stopped on sodium side thiosulfate after 9 months. In April of this year, she appreciated bilateral wounds on the posterior aspects of her calves.  These have been nonhealing since that time.  She is followed by [facility] with wound care every 2 weeks.  On exam, Amy Moses was very pleasant.  She denied fevers, chills.  She has minimal pain in her lower extremities at the wound sites.  Current wound care regimen includes Dakin soaked gauze wet-to-dry dressings.    Past Medical History:  Diagnosis Date   Anemia    Blindness of right eye with low vision in contralateral eye    s/p victrectomy   Diabetes mellitus, type II ([place])    Dyslipidemia    Glaucoma    Hypertension    Hypothyroidism (acquired)    Kidney disease    Stage 5    Pneumonia     Past Surgical History:  Procedure Laterality Date   ABDOMINAL AORTOGRAM W/LOWER EXTREMITY Bilateral 12/18/2020   Procedure: ABDOMINAL AORTOGRAM W/LOWER EXTREMITY;  Surgeon: Elam Dutch, MD;  Location: [facility];  Service: Cardiovascular;  Laterality: Bilateral;   ANKLE FRACTURE SURGERY     AV FISTULA PLACEMENT Left 08/18/2020   Procedure: LEFT ARM BRACHIOCEPHALIC ARTERIOVENOUS (AV) FISTULA CREATION;  Surgeon: Elam Dutch, MD;  Location: [facility];  Service: Vascular;  Laterality: Left;   BIOPSY  04/24/2021   Procedure: BIOPSY;  Surgeon: Eloise Harman, DO;  [facility]: [facility];  Service: Endoscopy;;   CESAREAN SECTION     CHOLECYSTECTOMY     COLONOSCOPY  04/24/2021   Surgeon: Eloise Harman, DO;  nonbleeding internal hemorrhoids, 1 large (25 mm) pedunculated transverse colon polyp (prolapse type polyp) with adherent clot and stigmata of recent bleed.   COLONOSCOPY WITH PROPOFOL N/A 05/14/2021   Procedure: COLONOSCOPY WITH PROPOFOL;  Surgeon: Daneil Dolin, MD;  Location: [facility];  Service: Endoscopy;  Laterality: N/A;   ESOPHAGOGASTRODUODENOSCOPY (EGD) WITH PROPOFOL N/A 04/24/2021   Surgeon: Eloise Harman, DO;  duodenal erosions and gastritis biopsied (pathology with peptic duodenitis, reactive gastropathy with erosions/chronic inflammation, negative for H. pylori)   EYE SURGERY     Vatrectomy   HEMOSTASIS CLIP PLACEMENT  05/14/2021   Procedure: HEMOSTASIS CLIP PLACEMENT;  Surgeon: Daneil Dolin, MD;  [facility];  Service: Endoscopy;;   IR PERC TUN PERIT CATH WO PORT  S&I /IMAG  09/15/2020   IR REMOVAL TUN CV CATH W/O FL  02/19/2021   IR US GUIDE VASC ACCESS RIGHT  09/15/2020   POLYPECTOMY  04/24/2021   Procedure: POLYPECTOMY;  Surgeon: Eloise Harman, DO;  [facility];  Service: Endoscopy;;   POLYPECTOMY  05/14/2021   Procedure: POLYPECTOMY;  Surgeon: Daneil Dolin, MD;  Location[facility];  Service:  Endoscopy;;   TOE SURGERY      Allergies  Allergen Reactions   Ace Inhibitors Cough    Prior to Admission medications   Medication Sig Start Date End Date Taking? Authorizing Provider  acetaminophen (TYLENOL) 500 MG tablet Take 1,000 mg by mouth every 6 (six) hours as needed for moderate pain.   Yes [provider]  aspirin EC 81 MG tablet Take 1 tablet (81 mg total) by mouth daily with breakfast. 05/19/21 05/19/22 Yes Johnson, Clanford L, MD  atorvastatin (LIPITOR) 10 MG tablet Take 1 tablet (10 mg total) by mouth daily. Patient taking differently: Take 10 mg by mouth at bedtime. 04/25/21  Yes Roxan Hockey, MD  camphor-menthol [name]) lotion Apply 1 application topically as needed for itching. 05/17/21  Yes Johnson, Clanford L, MD  cholestyramine light (PREVALITE) 4 g packet Take 1 packet (4 g total) by mouth 2 (two) times daily. Patient taking differently: Take 4 g by mouth once a week. 05/19/21 11/15/21 Yes Carver, Charles K, DO  HUMALOG KWIKPEN 100 UNIT/ML KwikPen Inject 2 Units into the skin 3 (three) times daily with meals. If eats 50% or more of meal. 05/15/21  Yes Johnson, Clanford L, MD  insulin glargine (LANTUS) 100 UNIT/ML injection Inject 0.05 mLs (5 Units total) into the skin at bedtime. 05/17/21  Yes Johnson, Clanford L, MD  levothyroxine (SYNTHROID) 50 MCG tablet Take 1 tablet (50 mcg total) by mouth daily before breakfast. 04/25/21  Yes Emokpae, Courage, MD  metoprolol succinate (TOPROL-XL) 50 MG 24 hr tablet Take 1 tablet (50 mg total) by mouth at bedtime. Take with or immediately following a meal. 04/25/21  Yes Emokpae, Courage, MD  pantoprazole (PROTONIX) 40 MG tablet Take 1 tablet (40 mg total) by mouth 2 (two) times daily. 04/25/21 04/25/22 Yes Emokpae, Courage, MD  polyethylene glycol (MIRALAX) 17 g packet Take 17 g by mouth daily as needed for mild constipation. 05/11/21  Yes Barton Dubois, MD  sevelamer carbonate (RENVELA) 800 MG tablet Take 1 tablet (800 mg total) by  mouth 3 (three) times daily with meals. 04/25/21  Yes Emokpae, Courage, MD  torsemide (DEMADEX) 100 MG tablet Take 150 mg by mouth daily.  05/22/20  Yes [provider]  Vitamin D, Ergocalciferol, (DRISDOL) 1.25 MG (50000 UNIT) CAPS capsule Take 50,000 Units by mouth every 14 (fourteen) days. 04/20/21  Yes [provider]    Social History   Socioeconomic History   Marital status: Single    Spouse name: Not on file   Number of children: Not on file   Years of education: Not on file   Highest education level: Not on file  Occupational History   Not on file  Tobacco Use   Smoking status: Never   Smokeless tobacco: Never  Vaping Use   Vaping Use: Never used  Substance and Sexual Activity   Alcohol use: No   Drug use: No   Sexual activity: Yes    Birth control/protection: Condom  Other Topics Concern   Not on file  Social History Narrative   Not on file  Social Determinants of Health   Financial Resource Strain: Not on file  Food Insecurity: Not on file  Transportation Needs: Not on file  Physical Activity: Not on file  Stress: Not on file  Social Connections: Not on file  Intimate Partner Violence: Not on file     Family History  Problem Relation Age of Onset   Heart disease Mother    Diabetes Mother    Kidney disease Mother    Diabetes Father    Heart disease Father    Diabetes Brother    Colon cancer Neg Hx     ROS: Otherwise negative unless mentioned in HPI  Physical Examination  Vitals:   08/07/21 0550 08/07/21 0700  BP: 140/79 140/69  Pulse: 81 81  Resp: 16 18  Temp: 98 F (36.7 C) 98.5 F (36.9 C)  SpO2: 95% 99%   Body mass index is 33.89 kg/m.  General:  WDWN in NAD Gait: Not observed HENT: WNL, normocephalic Pulmonary: normal non-labored breathing, without Rales, rhonchi,  wheezing Cardiac: regular, without  Murmurs, rubs or gallops;  Abdomen: soft, NT/ND, no masses Skin: without rashes - wounds as below Vascular  Exam/Pulses: 2+ femoral arteries, nonpalpable DP/PTs,  Extremities: without ischemic changes, without Gangrene , without cellulitis; with open wounds - Bilateral calves, left heal wound, right foot wound Left lateral thigh with draining Musculoskeletal: no muscle wasting or atrophy  Neurologic: A&O X 3;  No focal weakness or paresthesias are detected; speech is fluent/normal Psychiatric:  The pt has Normal affect. Lymph:  Unremarkable  CBC    Component Value Date/Time   WBC 10.6 (H) 08/07/2021 0411   RBC 4.47 08/07/2021 0411   HGB 10.6 (L) 08/07/2021 0411   HCT 35.9 (L) 08/07/2021 0411   PLT 330 08/07/2021 0411   MCV 80.3 08/07/2021 0411   MCH 23.7 (L) 08/07/2021 0411   MCHC 29.5 (L) 08/07/2021 0411   RDW 18.0 (H) 08/07/2021 0411   LYMPHSABS 1.1 08/06/2021 1710   MONOABS 1.4 (H) 08/06/2021 1710   EOSABS 0.2 08/06/2021 1710   BASOSABS 0.1 08/06/2021 1710    BMET    Component Value Date/Time   NA 133 (L) 08/07/2021 0411   K 4.4 08/07/2021 0411   CL 94 (L) 08/07/2021 0411   CO2 26 08/07/2021 0411   GLUCOSE 302 (H) 08/07/2021 0411   BUN 32 (H) 08/07/2021 0411   CREATININE 4.12 (H) 08/07/2021 0411   CALCIUM 9.3 08/07/2021 0411   GFRNONAA 13 (L) 08/07/2021 0411   GFRAA 10 (L) 06/11/2020 0336    COAGS: Lab Results  Component Value Date   INR 1.2 04/23/2021   INR 1.1 04/22/2021     Non-Invasive Vascular Imaging:   Pending  Statin:  Yes.   Beta Blocker:  Yes.   Aspirin:  Yes.   ACEI:  No. ARB:  No. CCB use:  No Other antiplatelets/anticoagulants:  No. -   ASSESSMENT/PLAN: Amy Moses is a pleasant 50 y.o. female who is unfortunately nonambulatory with end-stage renal disease and calciphylaxis with a several month history of nonhealing bilateral lower extremity wounds.  The wounds to bilateral lower extremities are extensive.  The right calf is superficial.  The left calf involves muscle and tendon.  There are areas of necrosis, specifically involving the left  calf, however there does not appear to be an active infection.  On the lateral aspect of the patient's left thigh, there is an abscess.  This was draining at bedside and therefore I packed it with iodoform  gauze.  The patient has nonpalpable pulses in bilateral lower extremities.  There is certainly a component of peripheral arterial disease. I do not know the degree to which peripheral arterial disease is playing a role in the patient's nonhealing especially in the setting of calciphylaxis.  Unfortunately, the patient is nonambulatory and therefore not a limb salvage candidate.  In evaluating previous notes, plastic surgery felt that debridement would be futile.  I discussed the role of amputation with Danyiel, however told her that I could not offer her a probability of healing especially in the setting of calciphylaxis.  Once her vascular studies result, I will talk to the patient more regarding her wounds.  Jalynn would be best treated with continued wound care. I am not aware of any academic institutions with multidisciplinary calciphylaxis clinics.   I will follow up her studies and see the pt in the coming days. Recommend general surgery v orthopedic surgery consult for skin and soft tissue management of thigh abscess.   Cassandria Santee MD [facility] [phone] 08/07/2021  2:53 PM

## 2021-08-07 NOTE — Progress Notes (Signed)
Pt arrived to unit, assessed, pt states she needs air mattress for her non healing leg wounds, air mattress ordered for comfort

## 2021-08-07 NOTE — Hospital Course (Addendum)
49 year old woman PMH ESRD, calciphylaxis, nonhealing left lower extremity wounds followed by wound care center, sent to the emergency department for infected wound and nonhealing thigh wound, vascular studies and vascular consultation.  Seen by vascular surgery, recommended ongoing wound care, outpatient referral for higher level of care treatment team approach to calciphylaxis given severity of disease and concern for wound healing.  MRI of the left thigh wound did not reveal significant abscess.  Seen by orthopedics. Conservative management with oral antibiotics on discharge, likely home 9/21.

## 2021-08-07 NOTE — H&P (Addendum)
History and Physical    Amy Moses [id number] DOB: 01-18-1971 DOA: 08/06/2021  PCP: Wynelle Cleveland, MD  Patient coming from: Home  I have personally briefly reviewed patient's old medical records in [place]  Chief Complaint: Wound infection  HPI: Amy Moses is a 50 y.o. female with medical history significant of ESRD on TTS dialysis, calciphylaxis with progressive wounds to BLE.  Specifically pt has wounds to B thighs, wound to L calf and R heel.  Of these, the R heel and R thigh seem to be healing somewhat.  The L thigh isnt healing very well, and the L calf wound is doing very poorly (see also images taken of L calf and L thigh in objection section of this note below).  She has been seeing wound care.  She saw plastic surgery at the beginning of this month, who didn't really have much to offer.  L calf in particular is now worrisome for infection with increased drainage from that area.  No fevers, chills, N/V, or pain.  Seen in wound care center today who sent her in to the ED.  Per their recs they would like vascular evaluation and IV ABx.   ED Course: WBC 12.7k, no other SIRS.  Started on zosyn / vanc in ED.   Review of Systems: As per HPI, otherwise all review of systems negative.  Past Medical History:  Diagnosis Date   Anemia    Blindness of right eye with low vision in contralateral eye    s/p victrectomy   Diabetes mellitus, type II ([place])    Dyslipidemia    Glaucoma    Hypertension    Hypothyroidism (acquired)    Kidney disease    Stage 5   Pneumonia     Past Surgical History:  Procedure Laterality Date   ABDOMINAL AORTOGRAM W/LOWER EXTREMITY Bilateral 12/18/2020   Procedure: ABDOMINAL AORTOGRAM W/LOWER EXTREMITY;  Surgeon: Elam Dutch, MD;  Location: [place] [facility];  Service: Cardiovascular;  Laterality: Bilateral;   ANKLE FRACTURE SURGERY     AV FISTULA PLACEMENT Left 08/18/2020   Procedure: LEFT ARM  BRACHIOCEPHALIC ARTERIOVENOUS (AV) FISTULA CREATION;  Surgeon: Elam Dutch, MD;  Location: [place];  Service: Vascular;  Laterality: Left;   BIOPSY  04/24/2021   Procedure: BIOPSY;  Surgeon: Eloise Harman, DO;  [facility];  Service: Endoscopy;;   CESAREAN SECTION     CHOLECYSTECTOMY     COLONOSCOPY  04/24/2021   Surgeon: Eloise Harman, DO;  nonbleeding internal hemorrhoids, 1 large (25 mm) pedunculated transverse colon polyp (prolapse type polyp) with adherent clot and stigmata of recent bleed.   COLONOSCOPY WITH PROPOFOL N/A 05/14/2021   Procedure: COLONOSCOPY WITH PROPOFOL;  Surgeon: Daneil Dolin, MD;  [facility];  Service: Endoscopy;  Laterality: N/A;   ESOPHAGOGASTRODUODENOSCOPY (EGD) WITH PROPOFOL N/A 04/24/2021   Surgeon: Eloise Harman, DO;  duodenal erosions and gastritis biopsied (pathology with peptic duodenitis, reactive gastropathy with erosions/chronic inflammation, negative for H. pylori)   EYE SURGERY     Vatrectomy   HEMOSTASIS CLIP PLACEMENT  05/14/2021   Procedure: HEMOSTASIS CLIP PLACEMENT;  Surgeon: Daneil Dolin, MD;  [facility];  Service: Endoscopy;;   IR PERC TUN PERIT CATH WO PORT S&I /IMAG  09/15/2020   IR REMOVAL TUN CV CATH W/O FL  02/19/2021   IR US GUIDE VASC ACCESS RIGHT  09/15/2020   POLYPECTOMY  04/24/2021   Procedure: POLYPECTOMY;  Surgeon: Hurshel Keys  K, DO;  [facility];  Service: Endoscopy;;   POLYPECTOMY  05/14/2021   Procedure: POLYPECTOMY;  Surgeon: Daneil Dolin, MD;  [facility];  Service: Endoscopy;;   TOE SURGERY       reports that she has never smoked. She has never used smokeless tobacco. She reports that she does not drink alcohol and does not use drugs.  Allergies  Allergen Reactions   Ace Inhibitors Cough    Family History  Problem Relation Age of Onset   Heart disease Mother    Diabetes Mother    Kidney disease Mother    Diabetes Father    Heart  disease Father    Diabetes Brother    Colon cancer Neg Hx      Prior to Admission medications   Medication Sig Start Date End Date Taking? Authorizing Provider  acetaminophen (TYLENOL) 500 MG tablet Take 1,000 mg by mouth every 6 (six) hours as needed for moderate pain.   Yes [provider]  aspirin EC 81 MG tablet Take 1 tablet (81 mg total) by mouth daily with breakfast. 05/19/21 05/19/22 Yes Johnson, Clanford L, MD  atorvastatin (LIPITOR) 10 MG tablet Take 1 tablet (10 mg total) by mouth daily. Patient taking differently: Take 10 mg by mouth at bedtime. 04/25/21  Yes Roxan Hockey, MD  camphor-menthol [name]) lotion Apply 1 application topically as needed for itching. 05/17/21  Yes Johnson, Clanford L, MD  cholestyramine light (PREVALITE) 4 g packet Take 1 packet (4 g total) by mouth 2 (two) times daily. Patient taking differently: Take 4 g by mouth once a week. 05/19/21 11/15/21 Yes Carver, Charles K, DO  HUMALOG KWIKPEN 100 UNIT/ML KwikPen Inject 2 Units into the skin 3 (three) times daily with meals. If eats 50% or more of meal. 05/15/21  Yes Johnson, Clanford L, MD  insulin glargine (LANTUS) 100 UNIT/ML injection Inject 0.05 mLs (5 Units total) into the skin at bedtime. 05/17/21  Yes Johnson, Clanford L, MD  levothyroxine (SYNTHROID) 50 MCG tablet Take 1 tablet (50 mcg total) by mouth daily before breakfast. 04/25/21  Yes Emokpae, Courage, MD  metoprolol succinate (TOPROL-XL) 50 MG 24 hr tablet Take 1 tablet (50 mg total) by mouth at bedtime. Take with or immediately following a meal. 04/25/21  Yes Emokpae, Courage, MD  pantoprazole (PROTONIX) 40 MG tablet Take 1 tablet (40 mg total) by mouth 2 (two) times daily. 04/25/21 04/25/22 Yes Emokpae, Courage, MD  polyethylene glycol (MIRALAX) 17 g packet Take 17 g by mouth daily as needed for mild constipation. 05/11/21  Yes Barton Dubois, MD  sevelamer carbonate (RENVELA) 800 MG tablet Take 1 tablet (800 mg total) by mouth 3 (three) times daily  with meals. 04/25/21  Yes Emokpae, Courage, MD  torsemide (DEMADEX) 100 MG tablet Take 150 mg by mouth daily.  05/22/20  Yes [provider]  Vitamin D, Ergocalciferol, (DRISDOL) 1.25 MG (50000 UNIT) CAPS capsule Take 50,000 Units by mouth every 14 (fourteen) days. 04/20/21  Yes [provider]    Physical Exam: Vitals:   08/06/21 1721 08/06/21 2010 08/06/21 2341 08/07/21 0230  BP: (!) 149/78 (!) 144/75 (!) 146/81 131/69  Pulse: 82 83 82 81  Resp: '18 18 18 18  '$ Temp: 98.2 F (36.8 C) 98.4 F (36.9 C) 97.7 F (36.5 C)   TempSrc: Oral Oral Oral   SpO2: 96% 100% 99% 97%  Weight:    95.3 kg  Height:    '5\' 6"'$  (1.676 m)  Constitutional: NAD, calm, comfortable Eyes: Blind R eye, has vision in L eye ENMT: Mucous membranes are moist. Posterior pharynx clear of any exudate or lesions.Normal dentition.  Neck: normal, supple, no masses, no thyromegaly Respiratory: clear to auscultation bilaterally, no wheezing, no crackles. Normal respiratory effort. No accessory muscle use.  Cardiovascular: Regular rate and rhythm, no murmurs / rubs / gallops. No extremity edema. 2+ pedal pulses. No carotid bruits.  Abdomen: no tenderness, no masses palpated. No hepatosplenomegaly. Bowel sounds positive.  Musculoskeletal: no clubbing / cyanosis. No joint deformity upper and lower extremities. Good ROM, no contractures. Normal muscle tone.  Skin:  Left Calf:    Left Thigh  Neurologic: CN 2-12 grossly intact. Sensation intact, DTR normal. Strength 5/5 in all 4.  Psychiatric: Normal judgment and insight. Alert and oriented x 3. Normal mood.    Labs on Admission: I have personally reviewed following labs and imaging studies  CBC: Recent Labs  Lab 08/06/21 1710  WBC 12.7*  NEUTROABS 9.8*  HGB 10.6*  HCT 36.1  MCV 80.6  PLT 123XX123   Basic Metabolic Panel: Recent Labs  Lab 08/06/21 1532  NA 134*  K 3.8  CL 95*  CO2 28  GLUCOSE 277*  BUN 29*  CREATININE 3.78*  CALCIUM 9.4    GFR: Estimated Creatinine Clearance: 21.4 mL/min (A) (by C-G formula based on SCr of 3.78 mg/dL (H)). Liver Function Tests: Recent Labs  Lab 08/06/21 1532  AST 10*  ALT 10  ALKPHOS 122  BILITOT 0.7  PROT <3.0*  ALBUMIN 2.4*   No results for input(s): LIPASE, AMYLASE in the last 168 hours. No results for input(s): AMMONIA in the last 168 hours. Coagulation Profile: No results for input(s): INR, PROTIME in the last 168 hours. Cardiac Enzymes: No results for input(s): CKTOTAL, CKMB, CKMBINDEX, TROPONINI in the last 168 hours. BNP (last 3 results) No results for input(s): PROBNP in the last 8760 hours. HbA1C: No results for input(s): HGBA1C in the last 72 hours. CBG: No results for input(s): GLUCAP in the last 168 hours. Lipid Profile: No results for input(s): CHOL, HDL, LDLCALC, TRIG, CHOLHDL, LDLDIRECT in the last 72 hours. Thyroid Function Tests: No results for input(s): TSH, T4TOTAL, FREET4, T3FREE, THYROIDAB in the last 72 hours. Anemia Panel: No results for input(s): VITAMINB12, FOLATE, FERRITIN, TIBC, IRON, RETICCTPCT in the last 72 hours. Urine analysis: No results found for: COLORURINE, APPEARANCEUR, LABSPEC, PHURINE, GLUCOSEU, HGBUR, BILIRUBINUR, KETONESUR, PROTEINUR, UROBILINOGEN, NITRITE, LEUKOCYTESUR  Radiological Exams on Admission: No results found.  EKG: Independently reviewed.  Assessment/Plan Principal Problem:   Calciphylaxis of left lower extremity with nonhealing ulcer with necrosis of muscle (HCC) Active Problems:   Uncontrolled type 2 diabetes mellitus with chronic kidney disease, with long-term current use of insulin (HCC)   Essential hypertension, benign   End stage renal disease (HCC)   Non-healing open wound of heel   Calciphylaxis   Blindness   Chronic ulcer of left thigh (HCC)    Calciphylaxis - Worst wound is by far the L calf, followed by L thigh Also has R heel, R calf, B buttock and R thigh wounds (which are apparently slowly  improving) LE wound pathway Rocephin, flagyl, vanc (h/o wound care, h/o MRSA in past). Wound care consult Plastic surgery sadly doesn't have anything to offer (see 9/1 note) Call vasc surg in AM But am worried they might not have much to offer in terms of revascularization as well given pt has decent pulses in leg, wounds appear to be related  to calciphylaxis. DM2 - Cont Lantus 5u QHS Very sensitive SSI AC HTN - Cont metoprolol ESRD - Cont renvela Cont torsemide Call nephro in AM for routine IP dialysis during stay  DVT prophylaxis: Lovenox Code Status: Full Family Communication: No family in room Disposition Plan: Home after cleared by vasc surg and treatment of limb threatening wounds (or possibly amputation I fear) Consults called: None Admission status: Admit to inpatient  Severity of Illness: The appropriate patient status for this patient is INPATIENT. Inpatient status is judged to be reasonable and necessary in order to provide the required intensity of service to ensure the patient's safety. The patient's presenting symptoms, physical exam findings, and initial radiographic and laboratory data in the context of their chronic comorbidities is felt to place them at high risk for further clinical deterioration. Furthermore, it is not anticipated that the patient will be medically stable for discharge from the hospital within 2 midnights of admission. The following factors support the patient status of inpatient.   IP status due to: 1) superinfection of L calf wound, failed outpt doxycycline 2) limb threatening L calf wound, non-healing, failing wound care as outpt.   * I certify that at the point of admission it is my clinical judgment that the patient will require inpatient hospital care spanning beyond 2 midnights from the point of admission due to high intensity of service, high risk for further deterioration and high frequency of surveillance required.*   Eithan Beagle M.  DO [facility]  How to contact the [facility] Attending or Consulting provider [place] or covering provider during after hours [place], for this patient?  Check the care team in [facility] and look for a) attending/consulting TRH provider listed and b) the [facility] team listed Log into [email or web address]  Amion Physician Scheduling and messaging for groups and whole hospitals  On call and physician scheduling software for group practices, residents, hospitalists and other medical providers for call, clinic, rotation and shift schedules. OnCall [employer] is a hospital-wide system for scheduling doctors and paging doctors on call. EasyPlot is for scientific plotting and data analysis.  [email or web address]  and use [place]'s universal password to access. If you do not have the password, please contact the hospital operator.  Locate the [facility] provider you are looking for under [facility] and page to a number that you can be directly reached. If you still have difficulty reaching the provider, please page the [facility] (Director on Call) for the Hospitalists listed on amion for assistance.  08/07/2021, 3:29 AM

## 2021-08-07 NOTE — Assessment & Plan Note (Addendum)
--   CBG labile, continue basal, meal coverage and SSI

## 2021-08-07 NOTE — ED Notes (Signed)
RN attempted to call report to [phone] x1.

## 2021-08-07 NOTE — Progress Notes (Signed)
Pharmacy Antibiotic Note  Amy Moses is a 50 y.o. female admitted on 08/06/2021 with  suspected diabetic foot infection with high risk for MRSA .  Pharmacy has been consulted for vancomycin dosing.  Ulcer on her left thigh has been worsening with purulent drainage and surrounding erythema, tried outpatient trial of doxycycline but did not improve. Outpatient hemodialysis schedule Tuesday, Thursday, Saturday.  Patient is afebrile. WBC elevated at 12.7. Hgb 10.6 (BL unknown but was in ~7s during last hospital admission in 04/2021). Platelets are wnl.  Vancomycin 2000 mg IV x1 already given.  Plan: Vancomycin 1000 mg IV qHD. Monitor CBC, blood culture results, levels as needed. Follow up HD schedule.  Height: '5\' 6"'$  (167.6 cm) Weight: 95.3 kg (210 lb) IBW/kg (Calculated) : 59.3  Temp (24hrs), Avg:98.3 F (36.8 C), Min:97.7 F (36.5 C), Max:98.7 F (37.1 C)  Recent Labs  Lab 08/06/21 1532 08/06/21 1533 08/06/21 1710 08/06/21 1733  WBC  --   --  12.7*  --   CREATININE 3.78*  --   --   --   LATICACIDVEN  --  1.5  --  1.1    Estimated Creatinine Clearance: 21.4 mL/min (A) (by C-G formula based on SCr of 3.78 mg/dL (H)).    Allergies  Allergen Reactions   Ace Inhibitors Cough    Antimicrobials this admission: Zosyn 9/17 x1 Flagyl 9/17 >> (9/23) Vancomycin 9/17 >> Ceftriaxone 9/17 >>  Microbiology results: 9/16 BCx: collected   Thank you for allowing pharmacy to be a part of this patient's care.  Collene Gobble, Student Pharmacist 08/07/2021 3:24 AM

## 2021-08-07 NOTE — Assessment & Plan Note (Signed)
--   Stable.  Continue metoprolol. 

## 2021-08-07 NOTE — Progress Notes (Addendum)
PROGRESS NOTE  Amy Moses [id number] DOB: 02-03-1971 DOA: 08/06/2021 PCP: Wynelle Cleveland, MD  Brief History   50 year old woman PMH ESRD, calciphylaxis, nonhealing left lower extremity wounds followed by wound care center, sent to the emergency department for infected wound and nonhealing thigh wound, vascular studies and vascular consultation.  A & P  * Calciphylaxis of left lower extremity with nonhealing ulcer with necrosis of muscle (HCC) -- Bilateral lower extremity wounds, nonhealing ulcer left calf, infected ulcer left thigh.  Followed by wound care center, sent here for further evaluation.  Recently seen by plastic surgery as an outpatient, no plans for intervention but referral was considered to tertiary care center. -- Continue empiric antibiotics.  Check bilateral lower extremity ABIs, vascular surgery consultation.  No signs or symptoms of acute decompensation or sepsis.  End stage renal disease [name]) -- Routine nephrology consultation for hemodialysis Tuesday Thursday Saturday  Uncontrolled type 2 diabetes mellitus with chronic kidney disease, with long-term current use of insulin (HCC) -- CBG high this a.m., continue basal insulin coverage.  Sliding scale insulin.  Essential hypertension, benign -- Stable.  Continue metoprolol  Disposition Plan:  Discussion: Plan as above  ADDENDUM 1550 Discussed in detail w/ Dr. Virl Cagey, appreciate care. Will f/u ABI but no good surgical options. Discussed w/ Dr. [name] for thigh abscess, he will see 9/18 but requested consult w/ IR to see if abscess is amenable to percutaneous drainage. D/w Dr. Anselm Pancoast, will obtain MRI thigh without contrast.  Status is: Inpatient  Remains inpatient appropriate because:IV treatments appropriate due to intensity of illness or inability to take PO and Inpatient level of care appropriate due to severity of illness  Dispo: The patient is from: Home              Anticipated d/c is to: Home               Patient currently is not medically stable to d/c.   Difficult to place patient No  DVT prophylaxis: heparin injection 5,000 Units Start: 08/07/21 0600   Code Status: Full Code Level of care: Med-Surg Family Communication: none   Murray Hodgkins, MD  [facility] Direct contact: see [email or web address] (further directions at bottom of note if needed) 7PM-7AM contact night coverage as at bottom of note 08/07/2021, 9:50 AM  LOS: 0 days   Significant Hospital Events   9/16 admit for LLE infected wounds   Consults:  Nephrology -- routine HD Vascular surgery -- LE wounds   Procedures:  Routine HD  Significant Diagnostic Tests:  ABI pending   Micro Data:  9/16 BC pending   Antimicrobials:  Metronidazole 9/16 > Ceftriaxone 9/16 > Vancomycin 9/16 >   Interval History/Subjective  CC: f/u LLE wounds  Feels ok, no pain in legs (has neuropathy, has not had any pain from wounds)  Went to HD Thursday   Objective   Vitals:  Vitals:   08/07/21 0550 08/07/21 0700  BP: 140/79 140/69  Pulse: 81 81  Resp: 16 18  Temp: 98 F (36.7 C) 98.5 F (36.9 C)  SpO2: 95% 99%    Exam: Physical Exam Vitals and nursing note reviewed.  Constitutional:      General: She is not in acute distress.    Appearance: Normal appearance. She is not ill-appearing or toxic-appearing.  Cardiovascular:     Rate and Rhythm: Normal rate and regular rhythm.     Heart sounds: No murmur heard.   No friction rub. No gallop.  Pulmonary:  Effort: Pulmonary effort is normal. No respiratory distress.     Breath sounds: Normal breath sounds. No wheezing, rhonchi or rales.  Musculoskeletal:     Right lower leg: No edema.     Left lower leg: No edema.  Skin:    Comments: BLE wounds dressed; not examined but pictures reviewed Perfusion left LE appears intact  Neurological:     Mental Status: She is alert.   I have personally reviewed the labs and other data, making special note of:   Today's Data   CBG AM 326 Hgb aA1c 9.5  Scheduled Meds:  aspirin EC  81 mg Oral Q breakfast   atorvastatin  10 mg Oral QHS   heparin  5,000 Units Subcutaneous Q8H   insulin aspart  0-6 Units Subcutaneous TID WC   insulin glargine-yfgn  5 Units Subcutaneous QHS   levothyroxine  50 mcg Oral Q0600   metoprolol succinate  50 mg Oral QHS   metroNIDAZOLE  500 mg Oral Q12H   pantoprazole  40 mg Oral BID   sevelamer carbonate  800 mg Oral TID WC   torsemide  150 mg Oral Daily   Continuous Infusions:  cefTRIAXone (ROCEPHIN)  IV     vancomycin      Principal Problem:   Calciphylaxis of left lower extremity with nonhealing ulcer with necrosis of muscle (HCC) Active Problems:   Calciphylaxis   Chronic ulcer of left thigh ([place])   Uncontrolled type 2 diabetes mellitus with chronic kidney disease, with long-term current use of insulin (HCC)   End stage renal disease (HCC)   Essential hypertension, benign   Primary hypothyroidism   Non-healing open wound of heel   Blindness   LOS: 0 days   How to contact the [facility] Attending or Consulting provider 7A - 7P or covering provider during after hours [place], for this patient?  Check the care team in [facility] and look for a) attending/consulting TRH provider listed and b) the [facility] team listed Log into [email or web address] and use [place] universal password to access. If you do not have the password, please contact the hospital operator. Locate the [facility] provider you are looking for under [facility] and page to a number that you can be directly reached. If you still have difficulty reaching the provider, please page the [name] (Director on Call) for the Hospitalists listed on amion for assistance.

## 2021-08-08 DIAGNOSIS — L97923 Non-pressure chronic ulcer of unspecified part of left lower leg with necrosis of muscle: Secondary | ICD-10-CM

## 2021-08-08 DIAGNOSIS — L97122 Non-pressure chronic ulcer of left thigh with fat layer exposed: Secondary | ICD-10-CM

## 2021-08-08 DIAGNOSIS — N186 End stage renal disease: Secondary | ICD-10-CM | POA: Diagnosis not present

## 2021-08-08 DIAGNOSIS — L97211 Non-pressure chronic ulcer of right calf limited to breakdown of skin: Secondary | ICD-10-CM

## 2021-08-08 DIAGNOSIS — E1165 Type 2 diabetes mellitus with hyperglycemia: Secondary | ICD-10-CM

## 2021-08-08 DIAGNOSIS — E1122 Type 2 diabetes mellitus with diabetic chronic kidney disease: Secondary | ICD-10-CM | POA: Diagnosis not present

## 2021-08-08 DIAGNOSIS — Z794 Long term (current) use of insulin: Secondary | ICD-10-CM

## 2021-08-08 DIAGNOSIS — L97911 Non-pressure chronic ulcer of unspecified part of right lower leg limited to breakdown of skin: Secondary | ICD-10-CM

## 2021-08-08 DIAGNOSIS — L089 Local infection of the skin and subcutaneous tissue, unspecified: Secondary | ICD-10-CM

## 2021-08-08 DIAGNOSIS — L97223 Non-pressure chronic ulcer of left calf with necrosis of muscle: Secondary | ICD-10-CM | POA: Diagnosis not present

## 2021-08-08 DIAGNOSIS — T148XXA Other injury of unspecified body region, initial encounter: Secondary | ICD-10-CM | POA: Diagnosis not present

## 2021-08-08 LAB — GLUCOSE, CAPILLARY
Glucose-Capillary: 207 mg/dL — ABNORMAL HIGH (ref 70–99)
Glucose-Capillary: 178 mg/dL — ABNORMAL HIGH (ref 70–99)
Glucose-Capillary: 288 mg/dL — ABNORMAL HIGH (ref 70–99)
Glucose-Capillary: 310 mg/dL — ABNORMAL HIGH (ref 70–99)

## 2021-08-08 LAB — HEPATITIS B SURFACE ANTIGEN: Hepatitis B Surface Ag: NONREACTIVE

## 2021-08-08 MED ORDER — NEPRO/CARBSTEADY PO LIQD
237.0000 mL | Freq: Two times a day (BID) | ORAL | Status: DC
  Administered 2021-08-08 – 2021-08-10 (×4): 237 mL via ORAL
  Filled 2021-08-08: qty 237

## 2021-08-08 MED ORDER — PROSOURCE PLUS PO LIQD
30.0000 mL | Freq: Every day | ORAL | Status: DC
  Administered 2021-08-08 – 2021-08-11 (×3): 30 mL via ORAL
  Filled 2021-08-08 (×4): qty 30

## 2021-08-08 MED ORDER — VANCOMYCIN HCL IN DEXTROSE 1-5 GM/200ML-% IV SOLN
1000.0000 mg | Freq: Once | INTRAVENOUS | Status: AC
  Administered 2021-08-08: 1000 mg via INTRAVENOUS

## 2021-08-08 NOTE — Progress Notes (Signed)
Patient has numerous wounds. Cleansed Re-dressed with abd pads and mepilex dressing.

## 2021-08-08 NOTE — Progress Notes (Signed)
IR was consulted about a wound / abscess aspiration.  I personally examined left thigh wound and reviewed recent MRI.  I was able to express bloody purulent fluid from the wound but do not see a discrete abscess collection on the MR.  I do not see anything that is amendable to [name] or image guided aspiration at this time.

## 2021-08-08 NOTE — Assessment & Plan Note (Signed)
Continue wound care. ?

## 2021-08-08 NOTE — Consult Note (Signed)
ORTHOPAEDIC CONSULTATION  REQUESTING PHYSICIAN: Samuella Cota, MD  Chief Complaint: Chronic ulcerations bilateral posterior calfs with small ulcer lateral left hip.  HPI: Amy Moses is a 50 y.o. female who presents with chronic ulcers both legs and left hip secondary to calciphylaxis.  Patient states that she has been on long-term sodium thio sulfate with dialysis and feels like the ulcers have gotten worse after the treatment has stopped.  Patient has had the ulcers for over 6 months.  Past Medical History:  Diagnosis Date   Anemia    Blindness of right eye with low vision in contralateral eye    s/p victrectomy   Diabetes mellitus, type II ([place])    Dyslipidemia    Glaucoma    Hypertension    Hypothyroidism (acquired)    Kidney disease    Stage 5   Pneumonia    Past Surgical History:  Procedure Laterality Date   ABDOMINAL AORTOGRAM W/LOWER EXTREMITY Bilateral 12/18/2020   Procedure: ABDOMINAL AORTOGRAM W/LOWER EXTREMITY;  Surgeon: Elam Dutch, MD;  Location: [facility];  Service: Cardiovascular;  Laterality: Bilateral;   ANKLE FRACTURE SURGERY     AV FISTULA PLACEMENT Left 08/18/2020   Procedure: LEFT ARM BRACHIOCEPHALIC ARTERIOVENOUS (AV) FISTULA CREATION;  Surgeon: Elam Dutch, MD;  Location: [facility];  Service: Vascular;  Laterality: Left;   BIOPSY  04/24/2021   Procedure: BIOPSY;  Surgeon: Eloise Harman, DO;  [facility];  Service: Endoscopy;;   CESAREAN SECTION     CHOLECYSTECTOMY     COLONOSCOPY  04/24/2021   Surgeon: Eloise Harman, DO;  nonbleeding internal hemorrhoids, 1 large (25 mm) pedunculated transverse colon polyp (prolapse type polyp) with adherent clot and stigmata of recent bleed.   COLONOSCOPY WITH PROPOFOL N/A 05/14/2021   Procedure: COLONOSCOPY WITH PROPOFOL;  Surgeon: Daneil Dolin, MD;  [facility];  Service: Endoscopy;  Laterality: N/A;   ESOPHAGOGASTRODUODENOSCOPY (EGD) WITH PROPOFOL N/A  04/24/2021   Surgeon: Eloise Harman, DO;  duodenal erosions and gastritis biopsied (pathology with peptic duodenitis, reactive gastropathy with erosions/chronic inflammation, negative for H. pylori)   EYE SURGERY     Vatrectomy   HEMOSTASIS CLIP PLACEMENT  05/14/2021   Procedure: HEMOSTASIS CLIP PLACEMENT;  Surgeon: Daneil Dolin, MD;  [facility];  Service: Endoscopy;;   IR PERC TUN PERIT CATH WO PORT S&I /IMAG  09/15/2020   IR REMOVAL TUN CV CATH W/O FL  02/19/2021   IR US GUIDE VASC ACCESS RIGHT  09/15/2020   POLYPECTOMY  04/24/2021   Procedure: POLYPECTOMY;  Surgeon: Eloise Harman, DO;  [facility];  Service: Endoscopy;;   POLYPECTOMY  05/14/2021   Procedure: POLYPECTOMY;  Surgeon: Daneil Dolin, MD;  [facility];  Service: Endoscopy;;   TOE SURGERY     Social History   Socioeconomic History   Marital status: Single    Spouse name: Not on file   Number of children: Not on file   Years of education: Not on file   Highest education level: Not on file  Occupational History   Not on file  Tobacco Use   Smoking status: Never   Smokeless tobacco: Never  Vaping Use   Vaping Use: Never used  Substance and Sexual Activity   Alcohol use: No   Drug use: No   Sexual activity: Yes    Birth control/protection: Condom  Other Topics Concern   Not on file  Social History  Narrative   Not on file   Social Determinants of Health   Financial Resource Strain: Not on file  Food Insecurity: Not on file  Transportation Needs: Not on file  Physical Activity: Not on file  Stress: Not on file  Social Connections: Not on file   Family History  Problem Relation Age of Onset   Heart disease Mother    Diabetes Mother    Kidney disease Mother    Diabetes Father    Heart disease Father    Diabetes Brother    Colon cancer Neg Hx    - negative except otherwise stated in the family history section Allergies  Allergen Reactions   Ace  Inhibitors Cough   Prior to Admission medications   Medication Sig Start Date End Date Taking? Authorizing Provider  acetaminophen (TYLENOL) 500 MG tablet Take 1,000 mg by mouth every 6 (six) hours as needed for moderate pain.   Yes [provider]  aspirin EC 81 MG tablet Take 1 tablet (81 mg total) by mouth daily with breakfast. 05/19/21 05/19/22 Yes Johnson, Clanford L, MD  atorvastatin (LIPITOR) 10 MG tablet Take 1 tablet (10 mg total) by mouth daily. Patient taking differently: Take 10 mg by mouth at bedtime. 04/25/21  Yes Roxan Hockey, MD  camphor-menthol [facility]) lotion Apply 1 application topically as needed for itching. 05/17/21  Yes Johnson, Clanford L, MD  cholestyramine light (PREVALITE) 4 g packet Take 1 packet (4 g total) by mouth 2 (two) times daily. Patient taking differently: Take 4 g by mouth once a week. 05/19/21 11/15/21 Yes Carver, Charles K, DO  HUMALOG KWIKPEN 100 UNIT/ML KwikPen Inject 2 Units into the skin 3 (three) times daily with meals. If eats 50% or more of meal. 05/15/21  Yes Johnson, Clanford L, MD  insulin glargine (LANTUS) 100 UNIT/ML injection Inject 0.05 mLs (5 Units total) into the skin at bedtime. 05/17/21  Yes Johnson, Clanford L, MD  levothyroxine (SYNTHROID) 50 MCG tablet Take 1 tablet (50 mcg total) by mouth daily before breakfast. 04/25/21  Yes Emokpae, Courage, MD  metoprolol succinate (TOPROL-XL) 50 MG 24 hr tablet Take 1 tablet (50 mg total) by mouth at bedtime. Take with or immediately following a meal. 04/25/21  Yes Emokpae, Courage, MD  pantoprazole (PROTONIX) 40 MG tablet Take 1 tablet (40 mg total) by mouth 2 (two) times daily. 04/25/21 04/25/22 Yes Emokpae, Courage, MD  polyethylene glycol (MIRALAX) 17 g packet Take 17 g by mouth daily as needed for mild constipation. 05/11/21  Yes Barton Dubois, MD  sevelamer carbonate (RENVELA) 800 MG tablet Take 1 tablet (800 mg total) by mouth 3 (three) times daily with meals. 04/25/21  Yes Emokpae, Courage, MD   torsemide (DEMADEX) 100 MG tablet Take 150 mg by mouth daily.  05/22/20  Yes [provider]  Vitamin D, Ergocalciferol, (DRISDOL) 1.25 MG (50000 UNIT) CAPS capsule Take 50,000 Units by mouth every 14 (fourteen) days. 04/20/21  Yes [provider]   MR FEMUR LEFT WO CONTRAST  Result Date: 08/07/2021 CLINICAL DATA:  Soft tissue infection EXAM: MR OF THE LEFT FEMUR WITHOUT CONTRAST TECHNIQUE: Multiplanar, multisequence MR imaging of the left femur/thigh was performed. No intravenous contrast was administered. COMPARISON:  10/29/2020 FINDINGS: Bones/Joint/Cartilage Boundary artifact obscures the hips and knees, but the femur is well visualized from the intertrochanteric region to the distal metaphysis. No marrow edema in this vicinity to indicate osteomyelitis. Ligaments N/A Muscles and Tendons There is diffuse intramuscular edema involving the anterior, medial, and posterior  compartments of the left thigh in a roughly similar manner to the prior exam from 10/29/2020, with some mildly lesser degree of involvement in the hamstring musculature and hip adductor musculature although both are still involved especially along the periphery of the muscle bellies. Myositis and fasciitis are not excluded but I do not see obvious signs of gas or well-defined loculated components of abscess, and the similar appearance to December indicates that this could be a chronic appearance at least partially relating to neurogenic edema or severe third spacing of fluid rather than necessarily being infectious. On the coronal images there is a similar although mildly less striking appearance of edema in the right thigh musculature. Soft tissues Marked diffuse infiltrative subcutaneous edema throughout the thigh, increased from 10/29/2020, and tracking up into the buttock and anterior abdominal wall subcutaneous tissues. Similar appearance on the right side. IMPRESSION: 1. Bilateral marked subcutaneous and intramuscular  edema diffusely in the thighs, with the intramuscular edema slightly greater on the left than the right. I do not see gas tracking in the soft tissues or a definite drainable abscess, and there is no evidence of osteomyelitis. Clearly in this setting, it is not feasible to exclude myositis or fasciitis by imaging given the diffuse abnormality, although other causes such as severe third spacing of fluid, or lymphatic or venous obstruction, could also cause this type of severe diffuse edema. A similar appearance was also present on 10/29/2020, although the degree of subcutaneous edema is worsened today. Electronically Signed   By: Van Clines M.D.   On: 08/07/2021 18:35   - pertinent xrays, CT, MRI studies were reviewed and independently interpreted  Positive ROS: All other systems have been reviewed and were otherwise negative with the exception of those mentioned in the HPI and as above.  Physical Exam: General: Alert, no acute distress Psychiatric: Patient is competent for consent with normal mood and affect Lymphatic: No axillary or cervical lymphadenopathy Cardiovascular: No pedal edema Respiratory: No cyanosis, no use of accessory musculature GI: No organomegaly, abdomen is soft and non-tender    Images:  '@ENCIMAGES'$ @  Labs:  Lab Results  Component Value Date   HGBA1C 9.5 (H) 08/07/2021   HGBA1C 7.3 (H) 03/30/2021   HGBA1C 7.4 (H) 06/10/2020   ESRSEDRATE 40 (H) 08/07/2021   CRP 11.1 (H) 08/07/2021   REPTSTATUS PENDING 08/07/2021   GRAMSTAIN  07/08/2021    RARE WBC PRESENT,BOTH PMN AND MONONUCLEAR RARE GRAM POSITIVE RODS RARE GRAM POSITIVE COCCI IN PAIRS    CULT  08/07/2021    NO GROWTH < 24 HOURS Performed at [facility], [place] [address], [place], [place] [address]    [facility] KOSERI 05/21/2021   LABORGA PROTEUS MIRABILIS 05/21/2021    Lab Results  Component Value Date   ALBUMIN 2.4 (L) 08/06/2021   ALBUMIN 2.4 (L) 05/15/2021   ALBUMIN 2.3 (L)  05/14/2021   PREALBUMIN 13.7 (L) 08/07/2021     CBC EXTENDED Latest Ref Rng & Units 08/07/2021 08/06/2021 05/15/2021  WBC 4.0 - 10.5 K/uL 10.6(H) 12.7(H) 7.8  RBC 3.87 - 5.11 MIL/uL 4.47 4.48 2.87(L)  HGB 12.0 - 15.0 g/dL 10.6(L) 10.6(L) 7.9(L)  HCT 36.0 - 46.0 % 35.9(L) 36.1 25.5(L)  PLT 150 - 400 K/uL 330 316 259  NEUTROABS 1.7 - 7.7 K/uL - 9.8(H) -  LYMPHSABS 0.7 - 4.0 K/uL - 1.1 -    Neurologic: Patient does not have protective sensation bilateral lower extremities.   MUSCULOSKELETAL:   Skin: Examination patient has a  large ulcers over the posterior aspect of both calves with nonviable tissue some granulation tissue with significant fibrinous exudate and drainage.  Patient has had chronic venous insufficiency with thickened dermatitis and swelling of both lower extremities.  There is a small wound over the lateral left hip which has been drained by vascular surgery there is a iodoform gauze in place with no purulent drainage.  Review of the MRI scan shows no evidence of any deep abscess.  Patient has an albumin of 2.4 with a prealbumin of 13.7.  Her hemoglobin A1c is 9.5.  Sed rate 40 and C-reactive protein 11.1.  Patient had attempted ankle-brachial indices last year and the waveforms were not obtainable due to the swelling and calciphylaxis.  Patient did have toe pressures of approximately 50%.  Assessment: Assessment: Diabetes with end-stage renal disease with calciphylaxis with chronic ulcers both legs and left hip.    Plan: Due to the chronic nature of these ulcers they are most likely heavily colonized with a biofilm.  I will write orders for Vive wear stockings to help decrease the swelling in the leg decrease the biofilm bioburden and absorb the drainage.  Stockings will need to be changed daily.  I will follow-up as an outpatient.  Continue IV antibiotics.  Thank you for the consult and the opportunity to see Amy Moses, [place] [phone] 9:55 AM

## 2021-08-08 NOTE — Progress Notes (Signed)
Orthopedic Tech Progress Note Patient Details:  Amy Moses 10-17-1971 [id number]  Patient ID: Amy Moses, female   DOB: 11-May-1971, 50 y.o.   MRN: [id number]  Amy Moses 08/08/2021, 10:36 AMCalled and Routed xxLarge prosthetic graduated compression stockings.

## 2021-08-08 NOTE — Progress Notes (Addendum)
Initial Nutrition Assessment  DOCUMENTATION CODES:   Obesity unspecified  INTERVENTION:  Provide Nepro Shake po BID, each supplement provides 425 kcal and 19 grams protein.  Provide 30 ml Prosource plus po once daily, each supplement provides 100 kcal and 15 grams of protein.   Encourage adequate PO intake.   NUTRITION DIAGNOSIS:   Increased nutrient needs related to wound healing as evidenced by estimated needs.  GOAL:   Patient will meet greater than or equal to 90% of their needs  MONITOR:   PO intake, Supplement acceptance, Skin, Weight trends, Labs, I & O's  REASON FOR ASSESSMENT:   Consult Wound healing  ASSESSMENT:   50 y.o. female with past medical history of end-stage renal disease, DM, calciphylaxis, nonhealing lower extremity wounds.  Meal completion has been 100%. Pt tolerating her po diet. RD to order nutritional supplements to aid in wound healing. Per MD, plans to continue wound care, antibiotics, and consider tertiary care treatment team. Unable to complete Nutrition-Focused physical exam at this time.   Labs and medications reviewed.   Diet Order:   Diet Order             Diet renal/carb modified with fluid restriction Diet-HS Snack? Nothing; Fluid restriction: 1200 mL Fluid; Room service appropriate? Yes; Fluid consistency: Thin  Diet effective now                   EDUCATION NEEDS:   Not appropriate for education at this time  Skin:  Skin Assessment: Skin Integrity Issues: Skin Integrity Issues:: Stage II, Incisions, Other (Comment) Stage II: R heel, buttocks Incisions: L arm Other: L leg calciphylaxis  Last BM:  9/15  Height:   Ht Readings from Last 1 Encounters:  08/07/21 '5\' 6"'$  (1.676 m)    Weight:   Wt Readings from Last 1 Encounters:  08/08/21 89.5 kg   BMI:  Body mass index is 31.85 kg/m.  Estimated Nutritional Needs:   Kcal:  2200-2400  Protein:  115-130 grams  Fluid:  1 L + UOP  Corrin Parker, MS, RD,  LDN RD pager number/after hours weekend pager number on Amion.

## 2021-08-08 NOTE — Progress Notes (Signed)
PROGRESS NOTE  Amy Moses [id number] DOB: Jun 30, 1971 DOA: 08/06/2021 PCP: Wynelle Cleveland, MD  Brief History   50 year old woman PMH ESRD, calciphylaxis, nonhealing left lower extremity wounds followed by wound care center, sent to the emergency department for infected wound and nonhealing thigh wound, vascular studies and vascular consultation.  Seen by vascular surgery, recommended ongoing wound care, outpatient referral for higher level of care treatment team approach to calciphylaxis given severity of disease and concern for wound healing.  MRI of the left thigh wound did not reveal significant abscess.  Seen by orthopedics.  Recommendation to continue IV antibiotics.  Likely home in 48 hours.  A & P  * Calciphylaxis of left lower extremity with nonhealing ulcer with necrosis of muscle (HCC) -- Bilateral lower extremity wounds, nonhealing ulcer left calf, infected ulcer left thigh.  Followed by wound care center.  Recently seen by plastic surgery as an outpatient, no plans for intervention but referral was considered to tertiary care center. --Seen by vascular surgery here and orthopedics.  Recommended to continue wound care, antibiotics and consider tertiary care treatment team. -- Continue empiric antibiotics.  Check bilateral lower extremity ABIs.  End stage renal disease ([place]) -- Hemodialysis per nephrology.  Uncontrolled type 2 diabetes mellitus with chronic kidney disease, with long-term current use of insulin (HCC) -- CBG labile, no changes to regimen at this point, continue subcutaneous long-acting insulin, meal coverage and sliding scale insulin.  Calciphylaxis of right lower extremity with nonhealing ulcer, limited to breakdown of skin ([place]) -- Continue wound care  Essential hypertension, benign -- Stable.  Continue metoprolol  Disposition Plan:  Discussion: Plan as above  Status is: Inpatient  Remains inpatient appropriate because:IV treatments appropriate due  to intensity of illness or inability to take PO and Inpatient level of care appropriate due to severity of illness  Dispo: The patient is from: Home              Anticipated d/c is to: Home              Patient currently is not medically stable to d/c.   Difficult to place patient No  DVT prophylaxis: heparin injection 5,000 Units Start: 08/07/21 0600   Code Status: Full Code Level of care: Med-Surg Family Communication: none   Murray Hodgkins, MD  [facility] Direct contact: see [email or web address] (further directions at bottom of note if needed) 7PM-7AM contact night coverage as at bottom of note 08/08/2021, 2:59 PM  LOS: 1 day   Significant Hospital Events   9/16 admit for LLE infected wounds   Consults:  Nephrology -- routine HD Vascular surgery -- LE wounds Orthopedics -- soft tissue infection left thigh   Procedures:  Routine HD  Significant Diagnostic Tests:  ABI pending   Micro Data:  9/16 BC pending   Antimicrobials:  Metronidazole 9/16 > Ceftriaxone 9/16 > Vancomycin 9/16 >   Interval History/Subjective  CC: f/u LLE wounds  Feels ok, no pain S/p HD today  Objective   Vitals:  Vitals:   08/08/21 1246 08/08/21 1427  BP: (!) 144/75 (!) 156/86  Pulse: 72 74  Resp: 18 17  Temp: 98.1 F (36.7 C) 97.8 F (36.6 C)  SpO2: 99% 100%    Exam: Physical Exam Vitals reviewed.  Constitutional:      Appearance: She is not ill-appearing.  Cardiovascular:     Rate and Rhythm: Normal rate and regular rhythm.     Heart sounds: No murmur heard. Pulmonary:     Effort:  Pulmonary effort is normal. No respiratory distress.     Breath sounds: No wheezing, rhonchi or rales.  Skin:    Comments: Wounds not examined today  Neurological:     Mental Status: She is alert.  Psychiatric:        Mood and Affect: Mood normal.        Behavior: Behavior normal.   I have personally reviewed the labs and other data, making special note of:   Today's Data  CBG  stable  Scheduled Meds:  aspirin EC  81 mg Oral Q breakfast   atorvastatin  10 mg Oral QHS   Chlorhexidine Gluconate Cloth  6 each Topical Q0600   heparin  5,000 Units Subcutaneous Q8H   insulin aspart  0-6 Units Subcutaneous TID WC   insulin aspart  2 Units Subcutaneous TID WC   insulin glargine-yfgn  5 Units Subcutaneous QHS   levothyroxine  50 mcg Oral Q0600   metoprolol succinate  50 mg Oral QHS   metroNIDAZOLE  500 mg Oral Q12H   pantoprazole  40 mg Oral BID   sevelamer carbonate  800 mg Oral TID WC   torsemide  150 mg Oral Daily   Continuous Infusions:  cefTRIAXone (ROCEPHIN)  IV Stopped (08/07/21 1558)   vancomycin      Principal Problem:   Calciphylaxis of left lower extremity with nonhealing ulcer with necrosis of muscle (HCC) Active Problems:   Calciphylaxis   Chronic ulcer of left thigh ([place])   Wound infection   Uncontrolled type 2 diabetes mellitus with chronic kidney disease, with long-term current use of insulin (HCC)   End stage renal disease (HCC)   Essential hypertension, benign   Primary hypothyroidism   Non-healing open wound of heel   Blindness   Non-pressure chronic ulcer of right calf limited to breakdown of skin (HCC)   Calciphylaxis of right lower extremity with nonhealing ulcer, limited to breakdown of skin ([place])   LOS: 1 day   How to contact the [facility] Attending or Consulting provider 7A - 7P or covering provider during after hours 7P -7A, for this patient?  Check the care team in [facility] and look for a) attending/consulting [facility] provider listed and b) the [facility] team listed Log into [email or web address] and use [place]'s universal password to access. If you do not have the password, please contact the hospital operator. Locate the [name] provider you are looking for under [facility] and page to a number that you can be directly reached. If you still have difficulty reaching the provider, please page the [facility] (Director on Call) for the Hospitalists listed on  amion for assistance.

## 2021-08-08 NOTE — Progress Notes (Signed)
[facility] Progress Note  Subjective: pt seen on HD  Vitals:   08/07/21 0550 08/07/21 0700 08/07/21 1656 08/07/21 1939  BP: 140/79 140/69 (!) 146/72 129/71  Pulse: 81 81 86 85  Resp: '16 18 17 16  '$ Temp: 98 F (36.7 C) 98.5 F (36.9 C) 98.9 F (37.2 C) 98.6 F (37 C)  TempSrc: Oral Oral Oral   SpO2: 95% 99% 98% 97%  Weight:      Height:        Exam:   alert, nad   no jvd  Chest cta bilat  Cor reg no RG  Abd soft ntnd no ascites   Ext bilat firm edema vs thick skin bilat LE"s; bilat lower legs are wrapped; healed over wounds in R ant groin/ thigh, same in ant L groin/ thigh; lateral L thigh is bandaged Neuro is alert, Ox 3 , nf LUA AVF +bruit     Home meds include asa, lipitor 10 , humalog , lantus insulin, synthroid, metoprolol xl, protonix, renvela 800 tid, demadex 150 qd, vitmains, prns  ECHO may 2022 - LVEF 55-60%, G2DD, RV okay, small effusion      OP HD: TTS [place]  [phone])  Dry wt around 88-89 kg per the patient     Assessment/ Plan: Infected chronic leg wounds - felt to be due to calciphylaxis. Per primary team, on IV abx H/o calciphylaxis - is not getting any Ca/ Vit D products. Will contact HD unit Monday for any further Rx details.   ESRD - on HD TTS. HD today off schedule BP/ volume - not sure dry wt, +LE edema, UF 3- 4 L as tol w/ HD today HTN - on toprol xl 50 qd, BP's wnl DM2 - per pmd Anemia ckd - Hb 10-11, follow MBD ckd - cont renvela as binder, get records monday       Rob Panaca 08/08/2021, 8:29 AM   Recent Labs  Lab 08/06/21 1532 08/06/21 1710 08/07/21 0411  K 3.8  --  4.4  BUN 29*  --  32*  CREATININE 3.78*  --  4.12*  CALCIUM 9.4  --  9.3  PHOS  --   --  5.5*  HGB  --  10.6* 10.6*   Inpatient medications:  aspirin EC  81 mg Oral Q breakfast   atorvastatin  10 mg Oral QHS   Chlorhexidine Gluconate Cloth  6 each Topical Q0600   heparin  5,000 Units Subcutaneous Q8H   insulin aspart  0-6 Units  Subcutaneous TID WC   insulin aspart  2 Units Subcutaneous TID WC   insulin glargine-yfgn  5 Units Subcutaneous QHS   levothyroxine  50 mcg Oral Q0600   metoprolol succinate  50 mg Oral QHS   metroNIDAZOLE  500 mg Oral Q12H   pantoprazole  40 mg Oral BID   sevelamer carbonate  800 mg Oral TID WC   torsemide  150 mg Oral Daily    cefTRIAXone (ROCEPHIN)  IV Stopped (08/07/21 1558)   vancomycin     acetaminophen **OR** acetaminophen, ondansetron **OR** ondansetron (ZOFRAN) IV, polyethylene glycol

## 2021-08-08 NOTE — Consult Note (Signed)
WOC Nurse Consult Note: Reason for Consult:Bilateral LEs with edema and ulcerations secondary to calciphylaxis.  WOC Nursing is simultaneously consulted with Orthopedics and Dr. [name] has seen the patient today and ordered compression stockings for antimicrobial donation and edema management. HE will follow she as an outpatient.There is no role for WOC nursing at this time.  Wound type: venous insufficiency with ulceration and ESRD Pressure Injury POA: N/A   WOC nursing team will not follow, but will remain available to this patient, the nursing and medical teams.  Please re-consult if needed. Thanks, Maudie Flakes, MSN, RN, [place], Arther Abbott  Pager# [phone]

## 2021-08-08 NOTE — Plan of Care (Signed)
  Problem: Health Behavior/Discharge Planning: Goal: Ability to manage health-related needs will improve Outcome: Progressing   Problem: Clinical Measurements: Goal: Ability to maintain clinical measurements within normal limits will improve Outcome: Progressing   Problem: Nutrition: Goal: Adequate nutrition will be maintained Outcome: Progressing   Problem: Elimination: Goal: Will not experience complications related to bowel motility Outcome: Progressing   Problem: Pain Managment: Goal: General experience of comfort will improve Outcome: Progressing   Problem: Safety: Goal: Ability to remain free from injury will improve Outcome: Progressing   Problem: Skin Integrity: Goal: Risk for impaired skin integrity will decrease Outcome: Progressing

## 2021-08-09 ENCOUNTER — Encounter (HOSPITAL_COMMUNITY): Payer: Medicare Other

## 2021-08-09 DIAGNOSIS — E1122 Type 2 diabetes mellitus with diabetic chronic kidney disease: Secondary | ICD-10-CM | POA: Diagnosis not present

## 2021-08-09 DIAGNOSIS — T148XXA Other injury of unspecified body region, initial encounter: Secondary | ICD-10-CM | POA: Diagnosis not present

## 2021-08-09 DIAGNOSIS — L97223 Non-pressure chronic ulcer of left calf with necrosis of muscle: Secondary | ICD-10-CM | POA: Diagnosis not present

## 2021-08-09 LAB — GLUCOSE, CAPILLARY
Glucose-Capillary: 270 mg/dL — ABNORMAL HIGH (ref 70–99)
Glucose-Capillary: 311 mg/dL — ABNORMAL HIGH (ref 70–99)
Glucose-Capillary: 321 mg/dL — ABNORMAL HIGH (ref 70–99)
Glucose-Capillary: 338 mg/dL — ABNORMAL HIGH (ref 70–99)
Glucose-Capillary: 266 mg/dL — ABNORMAL HIGH (ref 70–99)

## 2021-08-09 LAB — HEPATITIS B SURFACE ANTIBODY, QUANTITATIVE: Hep B S AB Quant (Post): <3.1 m[IU]/mL — ABNORMAL LOW (ref >9.9)

## 2021-08-09 MED ORDER — INSULIN ASPART 100 UNIT/ML IJ SOLN
3.0000 [IU] | Freq: Three times a day (TID) | INTRAMUSCULAR | Status: DC
  Administered 2021-08-09 – 2021-08-12 (×7): 3 [IU] via SUBCUTANEOUS

## 2021-08-09 MED ORDER — CHLORHEXIDINE GLUCONATE CLOTH 2 % EX PADS
6.0000 | MEDICATED_PAD | Freq: Every day | CUTANEOUS | Status: DC
  Administered 2021-08-10: 6 via TOPICAL

## 2021-08-09 MED ORDER — INSULIN ASPART 100 UNIT/ML IJ SOLN
0.0000 [IU] | Freq: Three times a day (TID) | INTRAMUSCULAR | Status: DC
Start: 2021-08-09 — End: 2021-08-12
  Administered 2021-08-09: 7 [IU] via SUBCUTANEOUS
  Administered 2021-08-10: 3 [IU] via SUBCUTANEOUS
  Administered 2021-08-10: 2 [IU] via SUBCUTANEOUS
  Administered 2021-08-11: 3 [IU] via SUBCUTANEOUS
  Administered 2021-08-11: 2 [IU] via SUBCUTANEOUS
  Administered 2021-08-11: 5 [IU] via SUBCUTANEOUS
  Administered 2021-08-12: 3 [IU] via SUBCUTANEOUS
  Administered 2021-08-12: 5 [IU] via SUBCUTANEOUS

## 2021-08-09 MED ORDER — ZINC OXIDE 12.8 % EX OINT
TOPICAL_OINTMENT | Freq: Three times a day (TID) | CUTANEOUS | Status: DC
  Filled 2021-08-09: qty 56.7

## 2021-08-09 MED ORDER — INSULIN GLARGINE-YFGN 100 UNIT/ML ~~LOC~~ SOLN
8.0000 [IU] | Freq: Every day | SUBCUTANEOUS | Status: DC
  Administered 2021-08-09 – 2021-08-11 (×3): 8 [IU] via SUBCUTANEOUS
  Filled 2021-08-09 (×5): qty 0.08

## 2021-08-09 MED ORDER — INSULIN ASPART 100 UNIT/ML IJ SOLN
0.0000 [IU] | Freq: Every day | INTRAMUSCULAR | Status: DC
  Administered 2021-08-09: 3 [IU] via SUBCUTANEOUS
  Administered 2021-08-10: 2 [IU] via SUBCUTANEOUS
  Administered 2021-08-11: 3 [IU] via SUBCUTANEOUS

## 2021-08-09 NOTE — Progress Notes (Signed)
Inpatient Diabetes Program Recommendations  AACE/ADA: New Consensus Statement on Inpatient Glycemic Control (2015)  Target Ranges:  Prepandial:   less than 140 mg/dL      Peak postprandial:   less than 180 mg/dL (1-2 hours)      Critically ill patients:  140 - 180 mg/dL   Lab Results  Component Value Date   GLUCAP 270 (H) 08/09/2021   HGBA1C 9.5 (H) 08/07/2021    Review of Glycemic Control Results for FRANCHESTER, BARR (MRN [id number]) as of 08/09/2021 10:42  Ref. Range 08/08/2021 17:35 08/08/2021 22:43 08/09/2021 06:37  Glucose-Capillary Latest Ref Range: 70 - 99 mg/dL 288 (H) 310 (H) 270 (H)   Diabetes history: DM 2 Outpatient Diabetes medications:  Humalog 2 units tid with meals, Lantus 5 units q HS Current orders for Inpatient glycemic control:  Novolog 0-6 units tid with meals Novolog 2 units tid with meals Semglee 5 units q HS  Inpatient Diabetes Program Recommendations:    Consider increasing Semglee to 8 units daily.  A1C is up from May of 2022, possibly due to suspected infection.  Will follow.  Thanks,  Adah Perl, RN, BC-ADM Inpatient Diabetes Coordinator Pager [phone]  (8a-5p)

## 2021-08-09 NOTE — Consult Note (Signed)
Hospital Consult    Reason for Consult:  BLE wounds Requesting Physician:  [employer] MRN #:  [id number]  History of Present Illness: This is a 50 y.o. female with past medical history significant for end-stage renal disease on hemodialysis, diabetes mellitus type 2, and calciphylaxis wounds of bilateral thighs and left calf.  She is active with outpatient wound clinic which she is seen every 2 weeks.  She is well-known to the [facility] with history of fistula creation in her left arm in September 2021.  She also underwent diagnostic arteriogram of right lower extremity by Dr. Oneida Alar in January of this year.  Angiogram demonstrated occlusion of right ATA with patent peroneal and posterior tibial on the right foot.  She also had reasonable tibial flow to the left foot at that time.  She is on IV antibiotics for suspected infection of the left calf wound.  She is also being seen by Dr. [name] who is recommended compression stockings for now.  Past Medical History:  Diagnosis Date   Anemia    Blindness of right eye with low vision in contralateral eye    s/p victrectomy   Diabetes mellitus, type II ([place])    Dyslipidemia    Glaucoma    Hypertension    Hypothyroidism (acquired)    Kidney disease    Stage 5   Pneumonia     Past Surgical History:  Procedure Laterality Date   ABDOMINAL AORTOGRAM W/LOWER EXTREMITY Bilateral 12/18/2020   Procedure: ABDOMINAL AORTOGRAM W/LOWER EXTREMITY;  Surgeon: Elam Dutch, MD;  Location: [facility];  Service: Cardiovascular;  Laterality: Bilateral;   ANKLE FRACTURE SURGERY     AV FISTULA PLACEMENT Left 08/18/2020   Procedure: LEFT ARM BRACHIOCEPHALIC ARTERIOVENOUS (AV) FISTULA CREATION;  Surgeon: Elam Dutch, MD;  Location: [facility];  Service: Vascular;  Laterality: Left;   BIOPSY  04/24/2021   Procedure: BIOPSY;  Surgeon: Eloise Harman, DO;  [facility];  Service: Endoscopy;;   CESAREAN SECTION     CHOLECYSTECTOMY     COLONOSCOPY   04/24/2021   Surgeon: Eloise Harman, DO;  nonbleeding internal hemorrhoids, 1 large (25 mm) pedunculated transverse colon polyp (prolapse type polyp) with adherent clot and stigmata of recent bleed.   COLONOSCOPY WITH PROPOFOL N/A 05/14/2021   Procedure: COLONOSCOPY WITH PROPOFOL;  Surgeon: Daneil Dolin, MD;  [facility];  Service: Endoscopy;  Laterality: N/A;   ESOPHAGOGASTRODUODENOSCOPY (EGD) WITH PROPOFOL N/A 04/24/2021   Surgeon: Eloise Harman, DO;  duodenal erosions and gastritis biopsied (pathology with peptic duodenitis, reactive gastropathy with erosions/chronic inflammation, negative for H. pylori)   EYE SURGERY     Vatrectomy   HEMOSTASIS CLIP PLACEMENT  05/14/2021   Procedure: HEMOSTASIS CLIP PLACEMENT;  Surgeon: Daneil Dolin, MD;  [facility];  Service: Endoscopy;;   IR PERC TUN PERIT CATH WO PORT S&I /IMAG  09/15/2020   IR REMOVAL TUN CV CATH W/O FL  02/19/2021   IR US GUIDE VASC ACCESS RIGHT  09/15/2020   POLYPECTOMY  04/24/2021   Procedure: POLYPECTOMY;  Surgeon: Eloise Harman, DO;  [facility];  Service: Endoscopy;;   POLYPECTOMY  05/14/2021   Procedure: POLYPECTOMY;  Surgeon: Daneil Dolin, MD;  [facility];  Service: Endoscopy;;   TOE SURGERY      Allergies  Allergen Reactions   Ace Inhibitors Cough    Prior to Admission medications   Medication Sig Start Date End Date Taking?  Authorizing Provider  acetaminophen (TYLENOL) 500 MG tablet Take 1,000 mg by mouth every 6 (six) hours as needed for moderate pain.   Yes [provider]  aspirin EC 81 MG tablet Take 1 tablet (81 mg total) by mouth daily with breakfast. 05/19/21 05/19/22 Yes Johnson, Clanford L, MD  atorvastatin (LIPITOR) 10 MG tablet Take 1 tablet (10 mg total) by mouth daily. Patient taking differently: Take 10 mg by mouth at bedtime. 04/25/21  Yes Roxan Hockey, MD  camphor-menthol [name]) lotion Apply 1 application topically as needed  for itching. 05/17/21  Yes Johnson, Clanford L, MD  cholestyramine light (PREVALITE) 4 g packet Take 1 packet (4 g total) by mouth 2 (two) times daily. Patient taking differently: Take 4 g by mouth once a week. 05/19/21 11/15/21 Yes Carver, Charles K, DO  HUMALOG KWIKPEN 100 UNIT/ML KwikPen Inject 2 Units into the skin 3 (three) times daily with meals. If eats 50% or more of meal. 05/15/21  Yes Johnson, Clanford L, MD  insulin glargine (LANTUS) 100 UNIT/ML injection Inject 0.05 mLs (5 Units total) into the skin at bedtime. 05/17/21  Yes Johnson, Clanford L, MD  levothyroxine (SYNTHROID) 50 MCG tablet Take 1 tablet (50 mcg total) by mouth daily before breakfast. 04/25/21  Yes Emokpae, Courage, MD  metoprolol succinate (TOPROL-XL) 50 MG 24 hr tablet Take 1 tablet (50 mg total) by mouth at bedtime. Take with or immediately following a meal. 04/25/21  Yes Emokpae, Courage, MD  pantoprazole (PROTONIX) 40 MG tablet Take 1 tablet (40 mg total) by mouth 2 (two) times daily. 04/25/21 04/25/22 Yes Emokpae, Courage, MD  polyethylene glycol (MIRALAX) 17 g packet Take 17 g by mouth daily as needed for mild constipation. 05/11/21  Yes Barton Dubois, MD  sevelamer carbonate (RENVELA) 800 MG tablet Take 1 tablet (800 mg total) by mouth 3 (three) times daily with meals. 04/25/21  Yes Emokpae, Courage, MD  torsemide (DEMADEX) 100 MG tablet Take 150 mg by mouth daily.  05/22/20  Yes [provider]  Vitamin D, Ergocalciferol, (DRISDOL) 1.25 MG (50000 UNIT) CAPS capsule Take 50,000 Units by mouth every 14 (fourteen) days. 04/20/21  Yes [provider]    Social History   Socioeconomic History   Marital status: Single    Spouse name: Not on file   Number of children: Not on file   Years of education: Not on file   Highest education level: Not on file  Occupational History   Not on file  Tobacco Use   Smoking status: Never   Smokeless tobacco: Never  Vaping Use   Vaping Use: Never used  Substance and Sexual  Activity   Alcohol use: No   Drug use: No   Sexual activity: Yes    Birth control/protection: Condom  Other Topics Concern   Not on file  Social History Narrative   Not on file   Social Determinants of Health   Financial Resource Strain: Not on file  Food Insecurity: Not on file  Transportation Needs: Not on file  Physical Activity: Not on file  Stress: Not on file  Social Connections: Not on file  Intimate Partner Violence: Not on file     Family History  Problem Relation Age of Onset   Heart disease Mother    Diabetes Mother    Kidney disease Mother    Diabetes Father    Heart disease Father    Diabetes Brother    Colon cancer Neg Hx     ROS: Otherwise  negative unless mentioned in HPI  Physical Examination  Vitals:   08/09/21 0619 08/09/21 0733  BP: 126/67 (!) 144/74  Pulse: 78 77  Resp: 15 17  Temp: 97.9 F (36.6 C) 97.9 F (36.6 C)  SpO2:  100%   Body mass index is 31.85 kg/m.  General:  WDWN in NAD Gait: Not observed HENT: WNL, normocephalic Pulmonary: normal non-labored breathing, without Rales, rhonchi,  wheezing Cardiac: regular Abdomen:  soft, NT/ND, no masses Skin: without rashes Vascular Exam/Pulses: feet warm with motor and sensation intact Extremities: R heel ulcerations Musculoskeletal: no muscle wasting or atrophy  Neurologic: A&O X 3;  No focal weakness or paresthesias are detected; speech is fluent/normal Psychiatric:  The pt has Normal affect. Lymph:  Unremarkable  CBC    Component Value Date/Time   WBC 10.6 (H) 08/07/2021 0411   RBC 4.47 08/07/2021 0411   HGB 10.6 (L) 08/07/2021 0411   HCT 35.9 (L) 08/07/2021 0411   PLT 330 08/07/2021 0411   MCV 80.3 08/07/2021 0411   MCH 23.7 (L) 08/07/2021 0411   MCHC 29.5 (L) 08/07/2021 0411   RDW 18.0 (H) 08/07/2021 0411   LYMPHSABS 1.1 08/06/2021 1710   MONOABS 1.4 (H) 08/06/2021 1710   EOSABS 0.2 08/06/2021 1710   BASOSABS 0.1 08/06/2021 1710    BMET    Component Value  Date/Time   NA 133 (L) 08/07/2021 0411   K 4.4 08/07/2021 0411   CL 94 (L) 08/07/2021 0411   CO2 26 08/07/2021 0411   GLUCOSE 302 (H) 08/07/2021 0411   BUN 32 (H) 08/07/2021 0411   CREATININE 4.12 (H) 08/07/2021 0411   CALCIUM 9.3 08/07/2021 0411   GFRNONAA 13 (L) 08/07/2021 0411   GFRAA 10 (L) 06/11/2020 0336    COAGS: Lab Results  Component Value Date   INR 1.2 04/23/2021   INR 1.1 04/22/2021     Non-Invasive Vascular Imaging:   ABI pending    ASSESSMENT/PLAN: This is a 50 y.o. female with calciphylaxis related wounds of bilateral lower extremities admitted to the hospital with suspected infection of left calf wound  -Angiogram by Dr. Oneida Alar earlier this year demonstrated an occluded right ATA however patent peroneal and PT flow into the right foot.  Patient also had reasonable left tibial artery runoff at the time.  Agree with IV antibiotics and continuing current wound care.  Encourage patient to offload right heel ulceration with pneumatic boots.  ABI/TBI pending.  On-call vascular surgeon Dr. Virl Cagey will evaluate the patient later today and provide further treatment plans   Dagoberto Ligas PA-C [facility] [phone]

## 2021-08-09 NOTE — Progress Notes (Signed)
PROGRESS NOTE  Amy Moses [id number] DOB: [birth year] DOA: 08/06/2021 PCP: Wynelle Cleveland, MD  Brief History   49 year old woman PMH ESRD, calciphylaxis, nonhealing left lower extremity wounds followed by wound care center, sent to the emergency department for infected wound and nonhealing thigh wound, vascular studies and vascular consultation.  Seen by vascular surgery, recommended ongoing wound care, outpatient referral for higher level of care treatment team approach to calciphylaxis given severity of disease and concern for wound healing.  MRI of the left thigh wound did not reveal significant abscess.  Seen by orthopedics.  Recommendation to continue IV antibiotics.  Likely home in 48 hours.  A & P  * Calciphylaxis of left lower extremity with nonhealing ulcer with necrosis of muscle (HCC) -- Bilateral lower extremity wounds, nonhealing ulcer left calf, infected ulcer left thigh.  Followed by wound care center.  Recently seen by plastic surgery as an outpatient, no plans for intervention but referral was considered to tertiary care center. --Seen by vascular surgery here and orthopedics.  Recommended to continue wound care, antibiotics and consider tertiary care treatment team as an outpatient -- Continue empiric antibiotics.  ABIs not possible.  End stage renal disease ([place]) -- Hemodialysis per nephrology.  Uncontrolled type 2 diabetes mellitus with chronic kidney disease, with long-term current use of insulin (HCC) -- CBG high, will increase basal insulin, continue SSI and meal coverage.  Calciphylaxis of right lower extremity with nonhealing ulcer, limited to breakdown of skin ([place]) -- Continue wound care  Essential hypertension, benign -- Stable.  Continue metoprolol  Disposition Plan:  Discussion: ID consult for opinion on abx regimen for discharge, hopefully home 1-2 days  Status is: Inpatient  Remains inpatient appropriate because:IV treatments appropriate due to  intensity of illness or inability to take PO and Inpatient level of care appropriate due to severity of illness  Dispo: The patient is from: Home              Anticipated d/c is to: Home              Patient currently is not medically stable to d/c.   Difficult to place patient No  DVT prophylaxis: heparin injection 5,000 Units Start: 08/07/21 0600   Code Status: Full Code Level of care: Med-Surg Family Communication: none   Murray Hodgkins, MD  [facility] Direct contact: see [email or web address] (further directions at bottom of note if needed) 7PM-7AM contact night coverage as at bottom of note 08/09/2021, 2:53 PM  LOS: 2 days   Significant Hospital Events   9/16 admit for LLE infected wounds   Consults:  Nephrology -- routine HD Vascular surgery -- LE wounds Orthopedics -- soft tissue infection left thigh ID   Procedures:  Routine HD  Significant Diagnostic Tests:  ABI not possible given rounds   Micro Data:  9/16 BC pending   Antimicrobials:  Metronidazole 9/16 > Ceftriaxone 9/16 > Vancomycin 9/16 >   Interval History/Subjective  CC: f/u LLE wounds  Feels fine, no complaints.  Objective   Vitals:  Vitals:   08/09/21 0733 08/09/21 1150  BP: (!) 144/74 (!) 155/80  Pulse: 77 77  Resp: 17 18  Temp: 97.9 F (36.6 C)   SpO2: 100% 100%    Exam: Physical Exam Constitutional:      General: She is not in acute distress.    Appearance: She is not ill-appearing or toxic-appearing.  Cardiovascular:     Rate and Rhythm: Normal rate and regular rhythm.     Heart  sounds: No murmur heard. Pulmonary:     Effort: Pulmonary effort is normal. No respiratory distress.     Breath sounds: No wheezing, rhonchi or rales.  Skin:    Comments: Left thigh, quarter-size wound packed w/ narrow gauze strip, some drainage, no erythema or pain w/ palpation.   Neurological:     Mental Status: She is alert.  Psychiatric:        Mood and Affect: Mood normal.        Behavior:  Behavior normal.   I have personally reviewed the labs and other data, making special note of:   Today's Data  CBG high  Scheduled Meds:  (feeding supplement) PROSource Plus  30 mL Oral Daily   aspirin EC  81 mg Oral Q breakfast   atorvastatin  10 mg Oral QHS   Chlorhexidine Gluconate Cloth  6 each Topical Q0600   [START ON 08/10/2021] Chlorhexidine Gluconate Cloth  6 each Topical Q0600   feeding supplement (NEPRO CARB STEADY)  237 mL Oral BID BM   heparin  5,000 Units Subcutaneous Q8H   insulin aspart  0-6 Units Subcutaneous TID WC   insulin aspart  2 Units Subcutaneous TID WC   insulin glargine-yfgn  8 Units Subcutaneous QHS   levothyroxine  50 mcg Oral Q0600   metoprolol succinate  50 mg Oral QHS   metroNIDAZOLE  500 mg Oral Q12H   pantoprazole  40 mg Oral BID   sevelamer carbonate  800 mg Oral TID WC   torsemide  150 mg Oral Daily   Zinc Oxide   Topical TID   Continuous Infusions:  cefTRIAXone (ROCEPHIN)  IV 2 g (08/09/21 0816)   vancomycin      Principal Problem:   Calciphylaxis of left lower extremity with nonhealing ulcer with necrosis of muscle (HCC) Active Problems:   Calciphylaxis   Chronic ulcer of left thigh (HCC)   Wound infection   Uncontrolled type 2 diabetes mellitus with chronic kidney disease, with long-term current use of insulin (HCC)   End stage renal disease (HCC)   Essential hypertension, benign   Primary hypothyroidism   Non-healing open wound of heel   Blindness   Non-pressure chronic ulcer of right calf limited to breakdown of skin (HCC)   Calciphylaxis of right lower extremity with nonhealing ulcer, limited to breakdown of skin ([place])   LOS: 2 days   How to contact the [facility] Attending or Consulting provider 7A - 7P or covering provider during after hours 7P -7A, for this patient?  Check the care team in [name] and look for a) attending/consulting [facility] provider listed and b) the [facility] team listed Log into [email or web address] and use [place]'s universal  password to access. If you do not have the password, please contact the hospital operator. Locate the [facility] provider you are looking for under [facility] and page to a number that you can be directly reached. If you still have difficulty reaching the provider, please page the [facility] (Director on Call) for the Hospitalists listed on amion for assistance.

## 2021-08-09 NOTE — Progress Notes (Signed)
Nutrition Follow-up  DOCUMENTATION CODES:   Obesity unspecified  INTERVENTION:   -Continue 30 ml Prosource Plus daily, each supplement provides 100 kcals and 15 grams protein -Continue Nepro Shake po BID, each supplement provides 425 kcal and 19 grams protein  -Renal MVI daily -Double protein portions with meals  NUTRITION DIAGNOSIS:   Increased nutrient needs related to wound healing as evidenced by estimated needs.  Ongoing  GOAL:   Patient will meet greater than or equal to 90% of their needs  Progressing   MONITOR:   PO intake, Supplement acceptance, Skin, Weight trends, Labs, I & O's  REASON FOR ASSESSMENT:   Consult Wound healing  ASSESSMENT:   50 y.o. female with past medical history of end-stage renal disease, DM, calciphylaxis, nonhealing lower extremity wounds.  Reviewed I/O's: -3.8 L x 24 hours  Spoke with pt at bedside, who reports feeling better today. She shares that she she has a great appetite and is eating most of the food that is brought to her (pt consumed 100% of her breakfast tray).   PTA, pt reports she consumes 2 meals per day on both HD and non-HD days. Pt consumes a sandwich for lunch and a meat, starch, and vegetable for dinner. Pt also consumes a protein bar at HD sessions.   Pt reports her dry wt is 200#. She denies any weight loss.   RD discussed importance of good meal and supplement intake to promote healing. Pt amenable to continue Prosource and Nepro.   Medications reviewed and include demadex.   NUTRITION - FOCUSED PHYSICAL EXAM:  Flowsheet Row Most Recent Value  Orbital Region No depletion  Upper Arm Region No depletion  Thoracic and Lumbar Region No depletion  Buccal Region No depletion  Temple Region No depletion  Clavicle Bone Region No depletion  Clavicle and Acromion Bone Region No depletion  Scapular Bone Region No depletion  Dorsal Hand Mild depletion  Patellar Region No depletion  Anterior Thigh Region No  depletion  Posterior Calf Region No depletion  Edema (RD Assessment) Mild  Hair Reviewed  Eyes Reviewed  Mouth Reviewed  Skin Reviewed  Nails Reviewed       Diet Order:   Diet Order             Diet renal/carb modified with fluid restriction Diet-HS Snack? Nothing; Fluid restriction: 1200 mL Fluid; Room service appropriate? Yes; Fluid consistency: Thin  Diet effective now                   EDUCATION NEEDS:   Not appropriate for education at this time  Skin:  Skin Assessment: Skin Integrity Issues: Skin Integrity Issues:: Stage II, Incisions, Other (Comment) Stage II: R heel, buttocks Incisions: L arm Other: L leg calciphylaxis  Last BM:  08/08/21  Height:   Ht Readings from Last 1 Encounters:  08/07/21 '5\' 6"'$  (1.676 m)    Weight:   Wt Readings from Last 1 Encounters:  08/08/21 89.5 kg   BMI:  Body mass index is 31.85 kg/m.  Estimated Nutritional Needs:   Kcal:  2200-2400  Protein:  115-130 grams  Fluid:  1 L + UOP    Amy Moses W, RD, LDN, CDCES Registered Dietitian II Certified Diabetes Care and Education Specialist Please refer to AMION for RD and/or RD on-call/weekend/after hours pager

## 2021-08-09 NOTE — Plan of Care (Signed)
  Problem: Clinical Measurements: Goal: Will remain free from infection Outcome: Progressing Goal: Diagnostic test results will improve Outcome: Progressing   Problem: Elimination: Goal: Will not experience complications related to bowel motility Outcome: Progressing Goal: Will not experience complications related to urinary retention Outcome: Progressing   

## 2021-08-09 NOTE — Progress Notes (Signed)
Amy Moses, Amy Moses ([id number]) Visit Report for 08/06/2021 Arrival Information Details Patient Name: Amy Moses, Amy Moses Date of Service: 08/06/2021 11:45 AM Medical Record Number: [id number] Patient Account Number: [id number] Date of Birth/Sex: [birth year] (50 y.o. F) Treating RN: Cornell Barman Primary Care Keela Rubert: Zenon Mayo Other Clinician: Referring Shayna Eblen: Zenon Mayo Treating Emberlin Verner/Extender: Skipper Cliche in Treatment: 93 Visit Information History Since Last Visit Added or deleted any medications: Yes Patient Arrived: Wheel Chair Pain Present Now: No Arrival Time: 12:01 Accompanied By: self Transfer Assistance: Transfer Board Patient Identification Verified: Yes Secondary Verification Process Completed: Yes Patient Requires Transmission-Based No Precautions: Patient Has Alerts: Yes Patient Alerts: Patient on Blood Thinner ASPIRIN Electronic Signature(s) Signed: 08/09/2021 4:05:19 PM By: Gretta Cool, BSN, RN, CWS, Kim RN, BSN Entered By: Gretta Cool, BSN, RN, CWS, Kim on 08/06/2021 12:01:43 Amy Moses ([id number]) -------------------------------------------------------------------------------- Lower Extremity Assessment Details Patient Name: Amy Moses Date of Service: 08/06/2021 11:45 AM Medical Record Number: [id number] Patient Account Number: [id number] Date of Birth/Sex: Aug 13, 1971 (49 y.o. F) Treating RN: Cornell Barman Primary Care Antoinetta Berrones: Zenon Mayo Other Clinician: Referring Verline Kong: Zenon Mayo Treating Deante Blough/Extender: Skipper Cliche in Treatment: 35 Vascular Assessment Pulses: Dorsalis Pedis Palpable: [Left:Yes] [Right:Yes] Electronic Signature(s) Signed: 08/09/2021 4:05:19 PM By: Gretta Cool, BSN, RN, CWS, Kim RN, BSN Entered By: Gretta Cool, BSN, RN, CWS, Kim on 08/06/2021 12:03:25 Amy Moses ([id number]) -------------------------------------------------------------------------------- [place] Details Patient Name:  Amy Moses Date of Service: 08/06/2021 11:45 AM Medical Record Number: [id number] Patient Account Number: [id number] Date of Birth/Sex: July 15, 1971 (49 y.o. F) Treating RN: Cornell Barman Primary Care Vadie Principato: Zenon Mayo Other Clinician: Referring Deivi Huckins: Zenon Mayo Treating Ashlon Lottman/Extender: Skipper Cliche in Treatment: 35 Active Inactive Necrotic Tissue Nursing Diagnoses: Impaired tissue integrity related to necrotic/devitalized tissue Goals: Necrotic/devitalized tissue will be minimized in the wound bed Date Initiated: 12/02/2020 Target Resolution Date: 07/05/2021 Goal Status: Active Patient/caregiver will verbalize understanding of reason and process for debridement of necrotic tissue Date Initiated: 12/02/2020 Date Inactivated: 01/15/2021 Target Resolution Date: 01/02/2021 Goal Status: Met Interventions: Assess patient pain level pre-, during and post procedure and prior to discharge Provide education on necrotic tissue and debridement process Notes: Soft Tissue Infection Nursing Diagnoses: Impaired tissue integrity Potential for infection: soft tissue Goals: Patient will remain free of wound infection Date Initiated: 07/08/2021 Target Resolution Date: 07/08/2021 Goal Status: Active Patient/caregiver will verbalize understanding of or measures to prevent infection and contamination in the home setting Date Initiated: 07/08/2021 Target Resolution Date: 07/08/2021 Goal Status: Active Patient's soft tissue infection will resolve Date Initiated: 07/08/2021 Target Resolution Date: 07/15/2021 Goal Status: Active Signs and symptoms of infection will be recognized early to allow for prompt treatment Date Initiated: 07/08/2021 Target Resolution Date: 07/08/2021 Goal Status: Active Interventions: Assess signs and symptoms of infection every visit Treatment Activities: Culture and sensitivity : 07/08/2021 Notes: Electronic Signature(s) Signed: 08/06/2021 3:56:23 PM By:  Gretta Cool, BSN, RN, CWS, Kim RN, BSN Entered By: Gretta Cool, BSN, RN, CWS, Kim on 08/06/2021 15:56:23 Amy Moses ([id number]) -------------------------------------------------------------------------------- Pain Assessment Details Patient Name: Amy Moses Date of Service: 08/06/2021 11:45 AM Medical Record Number: [id number] Patient Account Number: [id number] Date of Birth/Sex: [birth year] (49 y.o. F) Treating RN: Cornell Barman Primary Care Coltan Spinello: Zenon Mayo Other Clinician: Referring Dewanna Hurston: Zenon Mayo Treating Dorrie Cocuzza/Extender: Skipper Cliche in Treatment: 35 Active Problems Location of Pain Severity and Description of Pain Patient Has Paino No Site Locations Pain Management and Medication Current Pain Management: Notes Patient denies pain at this time. Electronic Signature(s) Signed: 08/09/2021 4:05:19 PM By: Gretta Cool, BSN, RN, CWS, Kim  RN, BSN Entered By: Gretta Cool, BSN, RN, CWS, Kim on 08/06/2021 12:02:25 Amy Moses ([id number]) -------------------------------------------------------------------------------- Wound Assessment Details Patient Name: Amy Moses Date of Service: 08/06/2021 11:45 AM Medical Record Number: [id number] Patient Account Number: [id number] Date of Birth/Sex: 03-26-1971 (50 y.o. F) Treating RN: Cornell Barman Primary Care Moris Ratchford: Zenon Mayo Other Clinician: Referring Jonmichael Beadnell: Zenon Mayo Treating Regene Mccarthy/Extender: Skipper Cliche in Treatment: 35 Wound Status Wound Number: 11 Primary Etiology: Calciphylaxis Wound Location: Right, Distal, Posterior Lower Leg Wound Status: Open Wounding Event: Gradually Appeared Date Acquired: 03/21/2021 Weeks Of Treatment: 17 Clustered Wound: No Photos Photo Uploaded By: Gretta Cool, BSN, RN, CWS, Kim on 08/06/2021 12:42:51 Wound Measurements Length: (cm) 10.5 Width: (cm) 10 Depth: (cm) 0.5 Area: (cm) 82.467 Volume: (cm) 41.233 % Reduction in Area: -2000% % Reduction in Volume:  -10391.9% Wound Description Classification: Full Thickness Without Exposed Support Structu Exudate Amount: Medium Exudate Type: Serosanguineous Exudate Color: red, brown res Electronic Signature(s) Signed: 08/09/2021 4:05:19 PM By: Gretta Cool, BSN, RN, CWS, Kim RN, BSN Entered By: Gretta Cool, BSN, RN, CWS, Kim on 08/06/2021 12:25:51 Amy Moses ([id number]) -------------------------------------------------------------------------------- Wound Assessment Details Patient Name: Amy Moses Date of Service: 08/06/2021 11:45 AM Medical Record Number: [id number] Patient Account Number: [id number] Date of Birth/Sex: 11/03/1971 (49 y.o. F) Treating RN: Cornell Barman Primary Care Velva Molinari: Zenon Mayo Other Clinician: Referring Anatalia Kronk: Zenon Mayo Treating Margarette Vannatter/Extender: Jeri Cos Weeks in Treatment: 35 Wound Status Wound Number: 14 Primary Etiology: Pressure Ulcer Wound Location: Right Gluteus Wound Status: Open Wounding Event: Gradually Appeared Date Acquired: 05/28/2021 Weeks Of Treatment: 9 Clustered Wound: No Photos Photo Uploaded By: Gretta Cool, BSN, RN, CWS, Kim on 08/06/2021 12:46:17 Wound Measurements Length: (cm) 3.5 Width: (cm) 2 Depth: (cm) 0.2 Area: (cm) 5.498 Volume: (cm) 1.1 % Reduction in Area: -102.9% % Reduction in Volume: -305.9% Wound Description Classification: Category/Stage II Exudate Amount: Medium Exudate Type: Serosanguineous Exudate Color: red, brown Electronic Signature(s) Signed: 08/09/2021 4:05:19 PM By: Gretta Cool, BSN, RN, CWS, Kim RN, BSN Entered By: Gretta Cool, BSN, RN, CWS, Kim on 08/06/2021 12:27:54 Amy Moses ([id number]) -------------------------------------------------------------------------------- Wound Assessment Details Patient Name: Amy Moses Date of Service: 08/06/2021 11:45 AM Medical Record Number: [id number] Patient Account Number: [id number] Date of Birth/Sex: Jul 09, 1971 (49 y.o. F) Treating RN: Cornell Barman Primary Care  Lemmie Vanlanen: Zenon Mayo Other Clinician: Referring Doratha Mcswain: Zenon Mayo Treating Khristen Cheyney/Extender: Jeri Cos Weeks in Treatment: 35 Wound Status Wound Number: 15 Primary Etiology: Pressure Ulcer Wound Location: Left Gluteal fold Wound Status: Open Wounding Event: Gradually Appeared Date Acquired: 06/02/2021 Weeks Of Treatment: 7 Clustered Wound: Yes Photos Photo Uploaded By: Gretta Cool, BSN, RN, CWS, Kim on 08/06/2021 12:48:54 Wound Measurements Length: (cm) 1 Width: (cm) 0.7 Depth: (cm) 0.2 Area: (cm) 0.55 Volume: (cm) 0.11 % Reduction in Area: 90.7% % Reduction in Volume: 81.3% Wound Description Classification: Category/Stage III Exudate Amount: Medium Exudate Type: Serosanguineous Exudate Color: red, brown Electronic Signature(s) Signed: 08/09/2021 4:05:19 PM By: Gretta Cool, BSN, RN, CWS, Kim RN, BSN Entered By: Gretta Cool, BSN, RN, CWS, Kim on 08/06/2021 12:27:54 Amy Moses ([id number]) -------------------------------------------------------------------------------- Wound Assessment Details Patient Name: Amy Moses Date of Service: 08/06/2021 11:45 AM Medical Record Number: [id number] Patient Account Number: [id number] Date of Birth/Sex: 1971/09/30 (49 y.o. F) Treating RN: Cornell Barman Primary Care Tiny Chaudhary: Zenon Mayo Other Clinician: Referring Suellen Durocher: Zenon Mayo Treating Dandrea Widdowson/Extender: Skipper Cliche in Treatment: 35 Wound Status Wound Number: 16 Primary Etiology: Pressure Ulcer Wound Location: Left, Distal, Plantar Foot Wound Status: Open Wounding Event: Pressure Injury Date Acquired: 07/23/2021 Weeks Of Treatment: 2 Clustered Wound: No Photos Photo  Uploaded By: Gretta Cool, BSN, RN, CWS, Kim on 08/06/2021 12:41:49 Wound Measurements Length: (cm) 2.2 Width: (cm) 2.5 Depth: (cm) 0.1 Area: (cm) 4.32 Volume: (cm) 0.432 % Reduction in Area: 26.7% % Reduction in Volume: 26.7% Wound Description Classification:  Unstageable/Unclassified Exudate Amount: None Present Electronic Signature(s) Signed: 08/09/2021 4:05:19 PM By: Gretta Cool, BSN, RN, CWS, Kim RN, BSN Entered By: Gretta Cool, BSN, RN, CWS, Kim on 08/06/2021 12:28:47 Amy Moses ([id number]) -------------------------------------------------------------------------------- Wound Assessment Details Patient Name: Amy Moses Date of Service: 08/06/2021 11:45 AM Medical Record Number: [id number] Patient Account Number: [id number] Date of Birth/Sex: 09-10-1971 (49 y.o. F) Treating RN: Cornell Barman Primary Care Wiatt Mahabir: Zenon Mayo Other Clinician: Referring Plato Alspaugh: Zenon Mayo Treating Tyke Outman/Extender: Skipper Cliche in Treatment: 55 Wound Status Wound Number: 17 Primary Diabetic Wound/Ulcer of the Lower Extremity Etiology: Wound Location: Right, Posterior Upper Leg Wound Open Wounding Event: Trauma Status: Date Acquired: 07/22/2021 Comorbid Cataracts, Glaucoma, Anemia, Hypertension, Type II Weeks Of Treatment: 0 History: Diabetes, End Stage Renal Disease, Neuropathy Clustered Wound: Yes Photos Photo Uploaded By: Gretta Cool, BSN, RN, CWS, Kim on 08/06/2021 12:47:26 Wound Measurements Length: (cm) 3 Width: (cm) 4 Depth: (cm) 0.2 Area: (cm) 9.425 Volume: (cm) 1.885 % Reduction in Area: % Reduction in Volume: Epithelialization: Small (1-33%) Tunneling: No Undermining: No Wound Description Classification: Grade 1 Exudate Amount: Medium Exudate Type: Serous Exudate Color: amber Foul Odor After Cleansing: No Slough/Fibrino Yes Wound Bed Granulation Amount: None Present (0%) Exposed Structure Necrotic Amount: Large (67-100%) Fascia Exposed: No Necrotic Quality: Adherent Slough Fat Layer (Subcutaneous Tissue) Exposed: Yes Tendon Exposed: No Muscle Exposed: No Joint Exposed: No Bone Exposed: No Electronic Signature(s) Signed: 08/09/2021 4:05:19 PM By: Gretta Cool, BSN, RN, CWS, Kim RN, BSN Entered By: Gretta Cool, BSN, RN, CWS, Kim  on 08/06/2021 12:33:42 Amy Moses ([id number]) -------------------------------------------------------------------------------- Wound Assessment Details Patient Name: Amy Moses Date of Service: 08/06/2021 11:45 AM Medical Record Number: [id number] Patient Account Number: [id number] Date of Birth/Sex: 01-17-1971 (49 y.o. F) Treating RN: Cornell Barman Primary Care Garrie Woodin: Zenon Mayo Other Clinician: Referring Hiroshi Krummel: Zenon Mayo Treating Erica Osuna/Extender: Skipper Cliche in Treatment: 35 Wound Status Wound Number: 2 Primary Etiology: Calciphylaxis Wound Location: Left, Medial Upper Leg Wound Status: Open Wounding Event: Gradually Appeared Date Acquired: 07/22/2020 Weeks Of Treatment: 35 Clustered Wound: No Photos Photo Uploaded By: Gretta Cool, BSN, RN, CWS, Kim on 08/06/2021 12:43:19 Wound Measurements Length: (cm) 0 Width: (cm) 0 Depth: (cm) 0 Area: (cm) 0 Volume: (cm) 0 % Reduction in Area: 100% % Reduction in Volume: 100% Wound Description Classification: Full Thickness Without Exposed Support Structu Exudate Amount: Medium Exudate Type: Serosanguineous Exudate Color: red, brown res Electronic Signature(s) Signed: 08/09/2021 4:05:19 PM By: Gretta Cool, BSN, RN, CWS, Kim RN, BSN Entered By: Gretta Cool, BSN, RN, CWS, Kim on 08/06/2021 12:28:47 Amy Moses ([id number]) -------------------------------------------------------------------------------- Wound Assessment Details Patient Name: Amy Moses Date of Service: 08/06/2021 11:45 AM Medical Record Number: [id number] Patient Account Number: [id number] Date of Birth/Sex: 09-26-1971 (49 y.o. F) Treating RN: Cornell Barman Primary Care Daryn Pisani: Zenon Mayo Other Clinician: Referring Justan Gaede: Zenon Mayo Treating Ryenn Howeth/Extender: Skipper Cliche in Treatment: 35 Wound Status Wound Number: 3 Primary Etiology: Calciphylaxis Wound Location: Left, Lateral Upper Leg Wound Status: Open Wounding Event:  Gradually Appeared Date Acquired: 07/22/2020 Weeks Of Treatment: 35 Clustered Wound: Yes Photos Photo Uploaded By: Gretta Cool, BSN, RN, CWS, Kim on 08/06/2021 12:43:34 Wound Measurements Length: (cm) 2.5 Width: (cm) 1 Depth: (cm) 2.2 Area: (cm) 1.963 Volume: (cm) 4.32 % Reduction in Area: 92.2% % Reduction in Volume: -71.9% Wound Description Classification:  Full Thickness Without Exposed Support Structu Exudate Amount: Large Exudate Type: Purulent Exudate Color: yellow, brown, green res Electronic Signature(s) Signed: 08/09/2021 4:05:19 PM By: Gretta Cool, BSN, RN, CWS, Kim RN, BSN Entered By: Gretta Cool, BSN, RN, CWS, Kim on 08/06/2021 12:29:26 Amy Moses ([id number]) -------------------------------------------------------------------------------- Wound Assessment Details Patient Name: Amy Moses Date of Service: 08/06/2021 11:45 AM Medical Record Number: [id number] Patient Account Number: [id number] Date of Birth/Sex: Jan 16, 1971 (49 y.o. F) Treating RN: Cornell Barman Primary Care Aly Hauser: Zenon Mayo Other Clinician: Referring Danija Gosa: Zenon Mayo Treating Jocelin Schuelke/Extender: Jeri Cos Weeks in Treatment: 35 Wound Status Wound Number: 4 Primary Etiology: Pressure Ulcer Wound Location: Right Calcaneus Wound Status: Open Wounding Event: Gradually Appeared Date Acquired: 10/21/2020 Weeks Of Treatment: 35 Clustered Wound: No Photos Photo Uploaded By: Gretta Cool, BSN, RN, CWS, Kim on 08/06/2021 12:47:48 Wound Measurements Length: (cm) 2.2 Width: (cm) 2.5 Depth: (cm) 0.1 Area: (cm) 4.32 Volume: (cm) 0.432 % Reduction in Area: 84.7% % Reduction in Volume: 84.7% Wound Description Classification: Unstageable/Unclassified Exudate Amount: Large Exudate Type: Serosanguineous Exudate Color: red, brown Electronic Signature(s) Signed: 08/09/2021 4:05:19 PM By: Gretta Cool, BSN, RN, CWS, Kim RN, BSN Entered By: Gretta Cool, BSN, RN, CWS, Kim on 08/06/2021 12:30:30 Amy Moses  ([id number]) -------------------------------------------------------------------------------- Wound Assessment Details Patient Name: Amy Moses Date of Service: 08/06/2021 11:45 AM Medical Record Number: [id number] Patient Account Number: [id number] Date of Birth/Sex: April 28, 1971 (49 y.o. F) Treating RN: Cornell Barman Primary Care Sawyer Mentzer: Zenon Mayo Other Clinician: Referring Reino Lybbert: Zenon Mayo Treating Shemiah Rosch/Extender: Skipper Cliche in Treatment: 35 Wound Status Wound Number: 9 Primary Etiology: Calciphylaxis Wound Location: Left, Lateral Calf Wound Status: Open Wounding Event: Gradually Appeared Date Acquired: 02/16/2021 Weeks Of Treatment: 24 Clustered Wound: No Photos Photo Uploaded By: Gretta Cool, BSN, RN, CWS, Kim on 08/06/2021 12:48:23 Wound Measurements Length: (cm) 16.5 Width: (cm) 14 Depth: (cm) 3.2 Area: (cm) 181.427 Volume: (cm) 580.566 % Reduction in Area: -2524.8% % Reduction in Volume: -83918.2% Wound Description Classification: Full Thickness With Exposed Support Structures Exudate Amount: Large Exudate Type: Serosanguineous Exudate Color: red, brown Electronic Signature(s) Signed: 08/09/2021 4:05:19 PM By: Gretta Cool, BSN, RN, CWS, Kim RN, BSN Entered By: Gretta Cool, BSN, RN, CWS, Kim on 08/06/2021 12:30:30 Amy Moses ([id number]) -------------------------------------------------------------------------------- [place] Details Patient Name: Amy Moses Date of Service: 08/06/2021 11:45 AM Medical Record Number: [id number] Patient Account Number: [id number] Date of Birth/Sex: 10/13/1971 (49 y.o. F) Treating RN: Cornell Barman Primary Care Lola Czerwonka: Zenon Mayo Other Clinician: Referring Delmy Holdren: Zenon Mayo Treating Harman Ferrin/Extender: Skipper Cliche in Treatment: 35 Vital Signs Time Taken: 11:50 Temperature (F): 98.6 Height (in): 66 Pulse (bpm): 88 Weight (lbs): 235 Respiratory Rate (breaths/min): 16 Body Mass Index (BMI):  37.9 Blood Pressure (mmHg): 163/90 Reference Range: 80 - 120 mg / dl Electronic Signature(s) Signed: 08/09/2021 4:05:19 PM By: Gretta Cool, BSN, RN, CWS, Kim RN, BSN Entered By: Gretta Cool, BSN, RN, CWS, Kim on 08/06/2021 12:02:07

## 2021-08-09 NOTE — TOC Initial Note (Addendum)
Transition of Care [facility]) - Initial/Assessment Note    Patient Details  Name: Amy Moses MRN: [id number] Date of Birth: [birth year]  Transition of Care [facility]) CM/SW Contact:    Sharin Mons, RN Phone Number: 08/09/2021, 1:55 PM  Clinical Narrative:    Presents with nonhealing bilateral lower extremity wounds, hx of  end-stage renal disease and calciphylaxis.         NCM spoke with pt regarding d/c planning. Pt W/C bound. Pt states resides with boyfriend and daughter.  PTA active with [facility] in [place], [place] every 2 weeks. Pt states [employer] provides pt with transportation to MD visits and HD center.  Per MD pt will d/c with home health services to assist with wound care. Home health order and face to face will be needed from MD for home health services. Pt states agreeable to home health services. States has used [facility] in the past and is ok with using them again. Referral made with [facility] for RN services, acceptance pending. Order faxed to [phone].    TOC team following and will continue to assist with TOC needs.....   08/10/2021 '@11'$ :57 am NCM received call from [name]. [facility] informed NCM they are unable to accept 2/2 staffing.  Referral made with [facility] and accepted.  Expected Discharge Plan: [place] (Resides with boyfriend and daughter) Barriers to Discharge: Continued Medical Work up   Patient Goals and CMS Choice     Choice offered to / list presented to : Patient  Expected Discharge Plan and Services Expected Discharge Plan: [place] (Resides with boyfriend and daughter)   Discharge Planning Services: CM Consult Post Acute Care Choice: Durable Medical Equipment (W/C, [facility]) Living arrangements for the past 2 months: Single Family Home                           HH Arranged: RN [facility] Agency: [facility] Date HH Agency Contacted: 08/09/21 Time [place]: 40 Representative spoke with at [place]: [place]  Prior Living Arrangements/Services Living arrangements for the past 2 months: [place]   Patient language and need for interpreter reviewed:: Yes Do you feel safe going back to the place where you live?: Yes      Need for Family Participation in Patient Care: Yes (Comment) Care giver support system in place?: Yes (comment) Current home services: Home RN Criminal Activity/Legal Involvement Pertinent to Current Situation/Hospitalization: No - Comment as needed  Activities of Daily Living Home Assistive Devices/Equipment: Wheelchair ADL Screening (condition at time of admission) Patient's cognitive ability adequate to safely complete daily activities?: No Is the patient deaf or have difficulty hearing?: No Does the patient have difficulty seeing, even when wearing glasses/contacts?: Yes Does the patient have difficulty concentrating, remembering, or making decisions?: No Patient able to express need for assistance with ADLs?: Yes Does the patient have difficulty dressing or bathing?: Yes Independently performs ADLs?: Yes (appropriate for developmental age) Communication: Independent Dressing (OT): Independent Grooming: Independent Feeding: Independent Bathing: Independent Toileting: Independent In/Out Bed: Needs assistance Is this a change from baseline?: Pre-admission baseline Walks in Home: Dependent Is this a change from baseline?: Pre-admission baseline Does the patient have difficulty walking or climbing stairs?: Yes Weakness of Legs: Both Weakness of Arms/Hands: None  Permission Sought/Granted   Permission granted to share information with : Yes, Verbal Permission Granted  Share Information with NAME: Hazel Sams  T[place]ta (Daughter)   [phone]           Emotional Assessment Appearance:: Appears stated age Attitude/Demeanor/Rapport: Gracious Affect (typically observed): Accepting Orientation: :  Oriented to Self, Oriented to Place, Oriented to  Time, Oriented to Situation Alcohol / Substance Use: Not Applicable Psych Involvement: No (comment)  Admission diagnosis:  Wound infection [T14.8XXA, L08.9] Calciphylaxis [E83.59] Patient Active Problem List   Diagnosis Date Noted   Wound infection    Non-pressure chronic ulcer of right calf limited to breakdown of skin ([place])    Calciphylaxis of right lower extremity with nonhealing ulcer, limited to breakdown of skin ([place])    Chronic ulcer of left thigh ([place]) 08/07/2021   Calciphylaxis of left lower extremity with nonhealing ulcer with necrosis of muscle ([place]) 08/07/2021   Lower GI bleed 05/12/2021   Blindness 05/10/2021   Shingles 05/10/2021   Acute GI bleeding 04/24/2021   Acute blood loss anemia 04/23/2021   GI bleed 04/22/2021   Fever 03/31/2021   Acute on chronic heart failure with preserved ejection fraction (HFpEF) ([place]) 03/30/2021   Pressure injury of skin 03/30/2021   End stage renal disease ([place]) 11/16/2020   Calciphylaxis 11/06/2020   Non-healing open wound of heel 11/03/2020   Diabetic foot infection ([place]) 11/01/2020   Decubitus ulcer, heel 11/01/2020   Closed nondisplaced fracture of left patella [id number]   Metabolic acidosis [id number]   Acute on chronic renal failure ([place]) 06/10/2020   Symptomatic anemia 06/10/2020   Acute pericardial effusion 06/10/2020   Other acute nonsuppurative otitis media, unspecified ear 11/29/2019   Chronic kidney disease, stage 4 (severe) ([place]) 03/05/2019   Skin ulcer, limited to breakdown of skin ([place]) 01/28/2019   Vitamin D deficiency 01/28/2019   Uncontrolled type 2 diabetes mellitus with chronic kidney disease, with long-term current use of insulin ([place]) 09/21/2015   Hyperlipidemia 09/21/2015   Essential hypertension, benign 09/21/2015   Primary hypothyroidism 09/21/2015   Iris bomb 07/31/2012   Secondary angle-closure glaucoma 07/31/2012   PCP:  Wynelle Cleveland,  MD Pharmacy:   [id number] [place] [place] [address]Phone: [phone]: [phone]    Social Determinants of Health (SDOH) Interventions    Readmission Risk Interventions Readmission Risk Prevention Plan 05/14/2021  Transportation Screening Complete  Medication Review (RReading Complete  HRI or HOxfordComplete  SW Recovery Care/Counseling Consult Complete  Palliative Care Screening Not Applicable  Skilled Nursing Facility Not Applicable  Some recent data might be hidden

## 2021-08-09 NOTE — Consult Note (Addendum)
WOC Nurse Consult Note: Patient receiving care in [name] 5N01.  Patient on bedpan. This is the second attempt to see the patient's sacral wound in 30 minutes. She states she was constipated, they gave her something for the constipation, and now her bowels are continuing to move.  As for the fingers, there was a dried area on the left index finger that does not need any type of intervention. She states she cut it while cooking some time ago.  It is currently healed and has dry skin to the site. No other areas seen on her fingers.  Will attempt to see sacral wound later today.  Val Riles, RN, MSN, CWOCN, CNS-BC, pager [phone]

## 2021-08-09 NOTE — Progress Notes (Addendum)
[facility] Progress Note   Subjective: Patient seen and examined at bedside.  Tolerated dialysis well yesterday.   Denies pain, CP, SOB, n/v/d, abdominal pain, n/v/d. Reports edema is moving from her LE to her hip/thigh area.    Objective Vitals:   08/08/21 2246 08/09/21 0619 08/09/21 0733 08/09/21 1150  BP: 136/70 126/67 (!) 144/74 (!) 155/80  Pulse: 75 78 77 77  Resp: '16 15 17 18  '$ Temp: 97.7 F (36.5 C) 97.9 F (36.6 C) 97.9 F (36.6 C)   TempSrc: Oral Oral Oral   SpO2: 97%  100% 100%  Weight:      Height:       Physical Exam General:chronically ill appearing female in NAD Heart:RRR, no meg Lungs:CTAB nml WOB on RA Abdomen:soft, NTND Extremities:+woody edema in calves with scaly skin, dressing over wounds, 1-2+ edema in hips/thighs Dialysis Access: LU AVF+b/t   Filed Weights   08/07/21 0230 08/08/21 0940 08/08/21 1246  Weight: 95.3 kg 93 kg 89.5 kg    Intake/Output Summary (Last 24 hours) at 08/09/2021 1231 Last data filed at 08/09/2021 0600 Gross per 24 hour  Intake 120 ml  Output 4200 ml  Net -4080 ml    Additional Objective Labs: Basic Metabolic Panel: Recent Labs  Lab 08/06/21 1532 08/07/21 0411  NA 134* 133*  K 3.8 4.4  CL 95* 94*  CO2 28 26  GLUCOSE 277* 302*  BUN 29* 32*  CREATININE 3.78* 4.12*  CALCIUM 9.4 9.3  PHOS  --  5.5*    CBC: Recent Labs  Lab 08/06/21 1710 08/07/21 0411  WBC 12.7* 10.6*  NEUTROABS 9.8*  --   HGB 10.6* 10.6*  HCT 36.1 35.9*  MCV 80.6 80.3  PLT 316 330   CBG: Recent Labs  Lab 08/08/21 1735 08/08/21 2243 08/09/21 0637 08/09/21 1149 08/09/21 1150  GLUCAP 288* 310* 270* 311* 321*    Studies/Results: MR FEMUR LEFT WO CONTRAST  Result Date: 08/07/2021 CLINICAL DATA:  Soft tissue infection EXAM: MR OF THE LEFT FEMUR WITHOUT CONTRAST TECHNIQUE: Multiplanar, multisequence MR imaging of the left femur/thigh was performed. No intravenous contrast was administered. COMPARISON:  10/29/2020  FINDINGS: Bones/Joint/Cartilage Boundary artifact obscures the hips and knees, but the femur is well visualized from the intertrochanteric region to the distal metaphysis. No marrow edema in this vicinity to indicate osteomyelitis. Ligaments N/A Muscles and Tendons There is diffuse intramuscular edema involving the anterior, medial, and posterior compartments of the left thigh in a roughly similar manner to the prior exam from 10/29/2020, with some mildly lesser degree of involvement in the hamstring musculature and hip adductor musculature although both are still involved especially along the periphery of the muscle bellies. Myositis and fasciitis are not excluded but I do not see obvious signs of gas or well-defined loculated components of abscess, and the similar appearance to December indicates that this could be a chronic appearance at least partially relating to neurogenic edema or severe third spacing of fluid rather than necessarily being infectious. On the coronal images there is a similar although mildly less striking appearance of edema in the right thigh musculature. Soft tissues Marked diffuse infiltrative subcutaneous edema throughout the thigh, increased from 10/29/2020, and tracking up into the buttock and anterior abdominal wall subcutaneous tissues. Similar appearance on the right side. IMPRESSION: 1. Bilateral marked subcutaneous and intramuscular edema diffusely in the thighs, with the intramuscular edema slightly greater on the left than the right. I do not see gas tracking in the soft tissues or  a definite drainable abscess, and there is no evidence of osteomyelitis. Clearly in this setting, it is not feasible to exclude myositis or fasciitis by imaging given the diffuse abnormality, although other causes such as severe third spacing of fluid, or lymphatic or venous obstruction, could also cause this type of severe diffuse edema. A similar appearance was also present on 10/29/2020, although the  degree of subcutaneous edema is worsened today. Electronically Signed   By: Van Clines M.D.   On: 08/07/2021 18:35    Medications:  cefTRIAXone (ROCEPHIN)  IV 2 g (08/09/21 0816)   vancomycin      (feeding supplement) PROSource Plus  30 mL Oral Daily   aspirin EC  81 mg Oral Q breakfast   atorvastatin  10 mg Oral QHS   Chlorhexidine Gluconate Cloth  6 each Topical Q0600   feeding supplement (NEPRO CARB STEADY)  237 mL Oral BID BM   heparin  5,000 Units Subcutaneous Q8H   insulin aspart  0-6 Units Subcutaneous TID WC   insulin aspart  2 Units Subcutaneous TID WC   insulin glargine-yfgn  8 Units Subcutaneous QHS   levothyroxine  50 mcg Oral Q0600   metoprolol succinate  50 mg Oral QHS   metroNIDAZOLE  500 mg Oral Q12H   pantoprazole  40 mg Oral BID   sevelamer carbonate  800 mg Oral TID WC   torsemide  150 mg Oral Daily   Zinc Oxide   Topical TID    Dialysis Orders: [facility] [place] [facility]  [phone]) 4hrs 400/500 2K 2.5Ca 89.5kg Hep 2000 loading, 800 qhr LU AVF EPO 8000 qHD Venofer 50qTuesday  Na thiosulfate d/c in August for break due to being on it for ~52month    Assessment/ Plan: Infected chronic leg wounds - nonhealing ulcer on L calf and infected ulcer L thigh - felt to be due to calciphylaxis. Per primary team, on IV abx. Seen by ortho, recommend compression stockings to be changed daily.  IR consulted for thigh wound/abscess aspiration - MRI reviewed, bloody purulent fluid expressed from wound but nothing seen to be amendable to US/image guided aspiration. Seen by [facility] agree with wound care and IV antibiotics.  H/o calciphylaxis - not on calcium or Vit D products. On increased Ca bath, will change to 2 Ca while here.  Previously on Na thiosulfate for about 939monthand was d/c about 1 month ago for a break from the medication per outpatient center. Will restart. ESRD - on HD TTS. HD off schedule yesterday.  Plan to resume regular schedule tomorrow.  BP/ volume - BP  mostly well controlled on toprol xl 50 qd.  Per OP unit chronic LE edema.  Plan for UF tomorrow 3-4L as tolerated.  UF 3.5 L yesterday.  DM2 - per pmd Anemia ckd - Hb 10.6.  On EPO qHD as outpatient.  Will follow labs and add ESA if needed. Will hold IV iron with ongoing infection.  MBD ckd - Ca and phos in goal.  Not on VDRA due to calciphylaxis.   Nutrition - Renal diet w/fluid restrictions.    LiJen MowPA-C [facility] 08/09/2021,12:31 PM  LOS: 2 days

## 2021-08-09 NOTE — Progress Notes (Signed)
Patient seen this morning with nurse at bedside.  Does not have her compression socks yet.  Dressing in place over left thigh wound.  No surrounding cellulitis no drainage through the dressing.  Will order daily packing with iodoform.  Should have socks in place hopefully will get them today.  Will need outpatient follow-up with Dr. [name]

## 2021-08-09 NOTE — Plan of Care (Addendum)
Zinc Oxide applied to buttocks and upper posterior thighs. Sacral foam applied above area. 3 pressure injuries on buttock. Packed and applied dressing to left thigh. Heel foams changed, no drainage present. L calf dressing changed due to saturation of prior dressing. R dressing intact with scant drainage. Vive socks not present at bedside, patient also states she has not seen anyone drop them off.   Problem: Education: Goal: Knowledge of General Education information will improve Description: Including pain rating scale, medication(s)/side effects and non-pharmacologic comfort measures Outcome: Progressing   Problem: Pain Managment: Goal: General experience of comfort will improve Outcome: Progressing   Problem: Safety: Goal: Ability to remain free from injury will improve Outcome: Progressing   Problem: Skin Integrity: Goal: Risk for impaired skin integrity will decrease Outcome: Progressing

## 2021-08-09 NOTE — Consult Note (Addendum)
WOC Nurse Consult Note: Patient receiving care in [facility] 5N1. Reason for Consult: Wound type: abrasion to right upper posterior thigh; MASD to left mid-buttock area close to the gluteal cleft, but not in the cleft.  Neither of these are consistent with pressure injuries. NO wound on sacrum. Scattered healed areas along buttocks and upper, posterior thighs that are hypopigmented in color (pink). Pressure Injury POA: Yes/No/NA Measurement: Wound bed: Drainage (amount, consistency, odor)  Periwound: Dressing procedure/placement/frequency: The patient reports she has been using foam dressings to the area, purchasing them herself and they are very expensive.  She is open to using Triple Paste to the buttocks, which I have ordered for TID.  Thank you for the consult.  Discussed plan of care with the patient.  [place] nurse will not follow at this time.  Please re-consult the [place] team if needed.  Val Riles, RN, MSN, CWOCN, CNS-BC, pager [phone]

## 2021-08-10 DIAGNOSIS — L97519 Non-pressure chronic ulcer of other part of right foot with unspecified severity: Secondary | ICD-10-CM | POA: Diagnosis not present

## 2021-08-10 DIAGNOSIS — E11622 Type 2 diabetes mellitus with other skin ulcer: Secondary | ICD-10-CM | POA: Diagnosis not present

## 2021-08-10 DIAGNOSIS — L97129 Non-pressure chronic ulcer of left thigh with unspecified severity: Secondary | ICD-10-CM | POA: Diagnosis not present

## 2021-08-10 DIAGNOSIS — E1122 Type 2 diabetes mellitus with diabetic chronic kidney disease: Secondary | ICD-10-CM | POA: Diagnosis not present

## 2021-08-10 DIAGNOSIS — L97529 Non-pressure chronic ulcer of other part of left foot with unspecified severity: Secondary | ICD-10-CM | POA: Diagnosis not present

## 2021-08-10 DIAGNOSIS — L02416 Cutaneous abscess of left lower limb: Secondary | ICD-10-CM | POA: Diagnosis not present

## 2021-08-10 DIAGNOSIS — T148XXA Other injury of unspecified body region, initial encounter: Secondary | ICD-10-CM | POA: Diagnosis not present

## 2021-08-10 LAB — CBC
HCT: 35.5 % — ABNORMAL LOW (ref 36.0–46.0)
Hemoglobin: 10.7 g/dL — ABNORMAL LOW (ref 12.0–15.0)
MCH: 24.3 pg — ABNORMAL LOW (ref 26.0–34.0)
MCHC: 30.1 g/dL (ref 30.0–36.0)
MCV: 80.5 fL (ref 80.0–100.0)
Platelets: 320 10*3/uL (ref 150–400)
RBC: 4.41 MIL/uL (ref 3.87–5.11)
RDW: 18 % — ABNORMAL HIGH (ref 11.5–15.5)
WBC: 9.1 10*3/uL (ref 4.0–10.5)
nRBC: 0 % (ref 0.0–0.2)

## 2021-08-10 LAB — RENAL FUNCTION PANEL
Albumin: 2.1 g/dL — ABNORMAL LOW (ref 3.5–5.0)
Anion gap: 12 (ref 5–15)
BUN: 56 mg/dL — ABNORMAL HIGH (ref 6–20)
CO2: 25 mmol/L (ref 22–32)
Calcium: 9.4 mg/dL (ref 8.9–10.3)
Chloride: 97 mmol/L — ABNORMAL LOW (ref 98–111)
Creatinine, Ser: 5.59 mg/dL — ABNORMAL HIGH (ref 0.44–1.00)
GFR, Estimated: 9 mL/min — ABNORMAL LOW (ref >60)
Glucose, Bld: 135 mg/dL — ABNORMAL HIGH (ref 70–99)
Phosphorus: 6.6 mg/dL — ABNORMAL HIGH (ref 2.5–4.6)
Potassium: 4.4 mmol/L (ref 3.5–5.1)
Sodium: 134 mmol/L — ABNORMAL LOW (ref 135–145)

## 2021-08-10 LAB — GLUCOSE, CAPILLARY
Glucose-Capillary: 124 mg/dL — ABNORMAL HIGH (ref 70–99)
Glucose-Capillary: 174 mg/dL — ABNORMAL HIGH (ref 70–99)
Glucose-Capillary: 227 mg/dL — ABNORMAL HIGH (ref 70–99)
Glucose-Capillary: 206 mg/dL — ABNORMAL HIGH (ref 70–99)

## 2021-08-10 MED ORDER — HEPARIN SODIUM (PORCINE) 1000 UNIT/ML DIALYSIS
1000.0000 [IU] | INTRAMUSCULAR | Status: DC | PRN
  Administered 2021-08-10: 800 [IU] via INTRAVENOUS_CENTRAL
  Filled 2021-08-10: qty 1

## 2021-08-10 MED ORDER — SODIUM CHLORIDE 0.9 % IV SOLN: 100.0000 mL | INTRAVENOUS | Status: DC | PRN

## 2021-08-10 MED ORDER — ALBUMIN HUMAN 25 % IV SOLN
25.0000 g | Freq: Once | INTRAVENOUS | Status: AC
  Administered 2021-08-10: 25 g via INTRAVENOUS

## 2021-08-10 MED ORDER — PENTAFLUOROPROP-TETRAFLUOROETH EX AERO: 1.0000 "application " | INHALATION_SPRAY | CUTANEOUS | Status: DC | PRN

## 2021-08-10 MED ORDER — LIDOCAINE HCL (PF) 1 % IJ SOLN: 5.0000 mL | INTRAMUSCULAR | Status: DC | PRN

## 2021-08-10 MED ORDER — ALTEPLASE 2 MG IJ SOLR: 2.0000 mg | Freq: Once | INTRAMUSCULAR | Status: DC | PRN

## 2021-08-10 MED ORDER — HEPARIN SODIUM (PORCINE) 1000 UNIT/ML DIALYSIS
2000.0000 [IU] | INTRAMUSCULAR | Status: DC | PRN
  Administered 2021-08-10: 2000 [IU] via INTRAVENOUS_CENTRAL
  Filled 2021-08-10: qty 2

## 2021-08-10 MED ORDER — SODIUM THIOSULFATE 250 MG/ML IV SOLN
25.0000 g | INTRAVENOUS | Status: DC
  Administered 2021-08-10 – 2021-08-12 (×2): 25 g via INTRAVENOUS
  Filled 2021-08-10 (×2): qty 100

## 2021-08-10 MED ORDER — ALBUMIN HUMAN 25 % IV SOLN
INTRAVENOUS | Status: AC
  Filled 2021-08-10: qty 100

## 2021-08-10 MED ORDER — LIDOCAINE-PRILOCAINE 2.5-2.5 % EX CREA: 1.0000 "application " | TOPICAL_CREAM | CUTANEOUS | Status: DC | PRN

## 2021-08-10 NOTE — Plan of Care (Signed)
Problem: Education: Goal: Knowledge of General Education information will improve Description: Including pain rating scale, medication(s)/side effects and non-pharmacologic comfort measures 08/10/2021 2344 by Ruel Favors, RN Outcome: Progressing 08/10/2021 2343 by Ruel Favors, RN Outcome: Progressing   Problem: Health Behavior/Discharge Planning: Goal: Ability to manage health-related needs will improve 08/10/2021 2344 by Ruel Favors, RN Outcome: Progressing 08/10/2021 2343 by Ruel Favors, RN Outcome: Progressing   Problem: Clinical Measurements: Goal: Ability to maintain clinical measurements within normal limits will improve 08/10/2021 2344 by Ruel Favors, RN Outcome: Progressing 08/10/2021 2343 by Ruel Favors, RN Outcome: Progressing Goal: Will remain free from infection 08/10/2021 2344 by Ruel Favors, RN Outcome: Progressing 08/10/2021 2343 by Ruel Favors, RN Outcome: Progressing Goal: Diagnostic test results will improve 08/10/2021 2344 by Ruel Favors, RN Outcome: Progressing 08/10/2021 2343 by Ruel Favors, RN Outcome: Progressing   Problem: Nutrition: Goal: Adequate nutrition will be maintained 08/10/2021 2344 by Ruel Favors, RN Outcome: Progressing 08/10/2021 2343 by Ruel Favors, RN Outcome: Progressing   Problem: Elimination: Goal: Will not experience complications related to bowel motility 08/10/2021 2344 by Ruel Favors, RN Outcome: Progressing 08/10/2021 2343 by Ruel Favors, RN Outcome: Progressing Goal: Will not experience complications related to urinary retention 08/10/2021 2344 by Ruel Favors, RN Outcome: Progressing 08/10/2021 2343 by Ruel Favors, RN Outcome: Progressing    Problem: Pain Managment: Goal: General experience of comfort will improve 08/10/2021 2344 by Ruel Favors, RN Outcome: Progressing 08/10/2021 2343 by Ruel Favors, RN Outcome: Progressing   Problem: Safety: Goal: Ability  to remain free from injury will improve 08/10/2021 2344 by Ruel Favors, RN Outcome: Progressing 08/10/2021 2343 by Ruel Favors, RN Outcome: Progressing   Problem: Skin Integrity: Goal: Risk for impaired skin integrity will decrease 08/10/2021 2344 by Ruel Favors, RN Outcome: Progressing 08/10/2021 2343 by Ruel Favors, RN Outcome: Progressing   Problem: Fluid Volume: Goal: Compliance with measures to maintain balanced fluid volume will improve 08/10/2021 2344 by Ruel Favors, RN Outcome: Progressing 08/10/2021 2343 by Ruel Favors, RN Outcome: Progressing   Report received and care assumed from previous shift. VS obtained, shift assessments completed - see flowsheets. Denies pain. Patient turns and repositions self in bed, using bedpan for elimination needs (wheelchair bound at baseline). B/L LE dressings remain CDI, Prevalon boots in place. Patient refusing metoprolol this evening - J. Olena Heckle APP notified via page. Patient currently resting in bed, bed in lowest position. Denies needs. Call bell within reach.

## 2021-08-10 NOTE — Assessment & Plan Note (Signed)
--  with cellultis and infection, still mild drainage, continue packing w/ gauze. Can transition to cefdinir on discharge per ID --continue outpt wound center care and also have arranged for [facility] RN.

## 2021-08-10 NOTE — Progress Notes (Signed)
   08/10/21 1212  Vitals  Temp 97.9 F (36.6 C)  Temp Source Oral  BP 124/66  BP Location Right Arm  BP Method Automatic  Patient Position (if appropriate) Lying  Pulse Rate 75  Pulse Rate Source Monitor  Resp 18  Oxygen Therapy  SpO2 97 %  O2 Device Room Air  Pain Assessment  Pain Scale 0-10  Pain Score 0  Dialysis Weight  Weight 93.4 kg (bed)  Type of Weight Post-Dialysis  Post-Hemodialysis Assessment  Rinseback Volume (mL) 250 mL  KECN 263 V  Dialyzer Clearance Lightly streaked  Duration of HD Treatment -hour(s) 3.5 hour(s)  Hemodialysis Intake (mL) 700 mL  UF Total -Machine (mL) 3440 mL  Net UF (mL) 2740 mL  Tolerated HD Treatment Yes  Post-Hemodialysis Comments tx complete-pt stable  AVG/AVF Arterial Site Held (minutes) 10 minutes  AVG/AVF Venous Site Held (minutes) 10 minutes  Fistula / Graft Left Upper arm Arteriovenous fistula  No placement date or time found.   Orientation: Left  Access Location: Upper arm  Access Type: Arteriovenous fistula  Site Condition No complications  Fistula / Graft Assessment Present;Thrill;Bruit  Status Deaccessed;Flushed  Drainage Description None  HD tx complete-pt stable. UF paused intermittently due to SBP-80s. Dr. Jonnie Finner on unit and made aware. Albumin 25g given with positive effects noted.

## 2021-08-10 NOTE — Progress Notes (Signed)
Spoke with [employer] in pharmacy regarding second request for pt's med sodium thiosulfate, pt with 1hr and 27 minutes left for tx. Initial request was sent via message at [phone].

## 2021-08-10 NOTE — Progress Notes (Signed)
[facility] Progress Note   Subjective:   Patient seen and examined at bedside during dialysis.  Reports diarrhea for the last 24 hours. Denies LE pain, chest pain, abdominal pain, nausea, vomiting, fever and chills.    Objective Vitals:   08/10/21 0930 08/10/21 1000 08/10/21 1030 08/10/21 1100  BP: (!) 86/52 117/69 129/67 127/68  Pulse: 72 75 75 74  Resp:      Temp:      TempSrc:      SpO2:      Weight:      Height:       Physical Exam General:well appearing female in NAD Heart:RRR, no mrg Lungs:CTAB, nml WOB on RA Abdomen:soft, NTND Extremities:+woody edema in calves with scaly skin, dressing over wounds, 1-2+ edema in hips/thighs Dialysis Access: LU AVF +b/t   Filed Weights   08/08/21 0940 08/08/21 1246 08/10/21 0817  Weight: 93 kg 89.5 kg 96.2 kg   No intake or output data in the 24 hours ending 08/10/21 1116  Additional Objective Labs: Basic Metabolic Panel: Recent Labs  Lab 08/06/21 1532 08/07/21 0411 08/10/21 0712  NA 134* 133* 134*  K 3.8 4.4 4.4  CL 95* 94* 97*  CO2 '28 26 25  '$ GLUCOSE 277* 302* 135*  BUN 29* 32* 56*  CREATININE 3.78* 4.12* 5.59*  CALCIUM 9.4 9.3 9.4  PHOS  --  5.5* 6.6*   Liver Function Tests: Recent Labs  Lab 08/06/21 1532 08/10/21 0712  AST 10*  --   ALT 10  --   ALKPHOS 122  --   BILITOT 0.7  --   PROT <3.0*  --   ALBUMIN 2.4* 2.1*   CBC: Recent Labs  Lab 08/06/21 1710 08/07/21 0411 08/10/21 0712  WBC 12.7* 10.6* 9.1  NEUTROABS 9.8*  --   --   HGB 10.6* 10.6* 10.7*  HCT 36.1 35.9* 35.5*  MCV 80.6 80.3 80.5  PLT 316 330 320   Blood Culture    Component Value Date/Time   SDES BLOOD RIGHT ARM 08/07/2021 0656   SPECREQUEST  08/07/2021 0656    BOTTLES DRAWN AEROBIC AND ANAEROBIC Blood Culture adequate volume   CULT  08/07/2021 0656    NO GROWTH 3 DAYS Performed at [name], [address], [place], [place] [address]    REPTSTATUS PENDING 08/07/2021 0656   CBG: Recent Labs  Lab  08/09/21 1149 08/09/21 1150 08/09/21 1602 08/09/21 2056 08/10/21 0658  GLUCAP 311* 321* 338* 266* 124*   Medications:  sodium chloride     sodium chloride     cefTRIAXone (ROCEPHIN)  IV 2 g (08/09/21 0816)   sodium thiosulfate infusion for calciphylaxis 25 g (08/10/21 1105)    (feeding supplement) PROSource Plus  30 mL Oral Daily   aspirin EC  81 mg Oral Q breakfast   atorvastatin  10 mg Oral QHS   Chlorhexidine Gluconate Cloth  6 each Topical Q0600   Chlorhexidine Gluconate Cloth  6 each Topical Q0600   feeding supplement (NEPRO CARB STEADY)  237 mL Oral BID BM   heparin  5,000 Units Subcutaneous Q8H   insulin aspart  0-5 Units Subcutaneous QHS   insulin aspart  0-9 Units Subcutaneous TID WC   insulin aspart  3 Units Subcutaneous TID WC   insulin glargine-yfgn  8 Units Subcutaneous QHS   levothyroxine  50 mcg Oral Q0600   metoprolol succinate  50 mg Oral QHS   pantoprazole  40 mg Oral BID   sevelamer carbonate  800 mg Oral TID WC   torsemide  150 mg Oral Daily   Zinc Oxide   Topical TID    Dialysis Orders: [facility] [place] [facility]  [phone]) 4hrs 400/500 2K 2.5Ca 89.5kg Hep 2000 loading, 800 qhr LU AVF EPO 8000 qHD Venofer 50qTuesday   Na thiosulfate d/c in August for break due to being on it for ~37month    Assessment/ Plan: Infected chronic leg wounds - nonhealing ulcer on L calf and infected ulcer L thigh - felt to be due to calciphylaxis. Per primary team, on IV abx. Seen by ortho, recommend compression stockings to be changed daily.  IR consulted for thigh wound/abscess aspiration - MRI reviewed, bloody purulent fluid expressed from wound but nothing seen to be amendable to US/image guided aspiration. Seen by VVS agree with wound care and IV antibiotics.  H/o calciphylaxis - not on calcium or Vit D products. Use 2Ca bath while admitted if possible. Previously on Na thiosulfate for about 947monthand was d/c about 1 month ago for a break from the medication per  outpatient center. Patient believes wounds worsened during this time period.  Will restart. ESRD - on HD TTS. HD today per regular schedule. BP/ volume - BP mostly well controlled on toprol xl 50 qd.  Episode of hypotension during HD today, given IV albumin for BP support. Per OP unit chronic LE edema.  UF goal 4L today as tolerated.  DM2 - per pmd Anemia ckd - Hb 10.7.  On EPO qHD as outpatient.  Will follow labs and add ESA if needed. Will hold IV iron with ongoing infection.  MBD ckd - Ca at goal.  Phos elevated today, continue renvela.  Not on VDRA due to calciphylaxis.   Nutrition - Renal diet w/fluid restrictions. Alb 2.1, protein supplements ordered.   LiJen MowPA-C [facility] 08/10/2021,11:16 AM  LOS: 3 days

## 2021-08-10 NOTE — Progress Notes (Addendum)
  Daily Progress Note    Subjective: Patient seen during dialysis.  Doing well, no complaints.  Denies fevers, chills  Objective: Vitals:   08/10/21 1130 08/10/21 1200  BP: 118/60 (!) 107/58  Pulse: 72 79  Resp:    Temp:    SpO2:      Physical Examination Continues to have bilateral lower extremity wounds.  Dressings were not changed in dialysis Regular rate  Nonlabored breathing   ASSESSMENT/PLAN:  Patient's previous angiogram reviewed from roughly 6 months ago demonstrating two-vessel runoff to the right and three-vessel runoff to the left foot. Wounds on the right leg have been present since that time. Unable to get ABIs due to the significance of the wounds on bilateral lower extremities. Unfortunately, PRS does not feel the wounds will heal independent of blood flow due to calciphylaxis.  She would benefit from continued wound care.   Amputation will only be offered in the setting of ascending infection as the likelihood of healing is poor in the setting of calciphylaxis.    Amy Santee MD MS [facility] [phone] 08/10/2021  12:07 PM

## 2021-08-10 NOTE — Progress Notes (Signed)
PROGRESS NOTE  Amy Moses [id number] DOB: [birth year] DOA: 08/06/2021 PCP: Wynelle Cleveland, MD  Brief History   50 year old woman PMH ESRD, calciphylaxis, nonhealing left lower extremity wounds followed by wound care center, sent to the emergency department for infected wound and nonhealing thigh wound, vascular studies and vascular consultation.  Seen by vascular surgery, recommended ongoing wound care, outpatient referral for higher level of care treatment team approach to calciphylaxis given severity of disease and concern for wound healing.  MRI of the left thigh wound did not reveal significant abscess.  Seen by orthopedics. Conservative management with oral antibiotics on discharge, likely home 9/21.  A & P  * Calciphylaxis of left lower extremity with nonhealing ulcer with necrosis of muscle (HCC) -- Bilateral lower extremity wounds, nonhealing ulcer left calf, infected ulcer left thigh.  Followed by wound care center.  Recently seen by plastic surgery as an outpatient, no plans for intervention but referral was considered to tertiary care center. --Seen by vascular surgery here and orthopedics.  Recommended to continue wound care, antibiotics and consider tertiary care treatment team as an outpatient.  ABIs not possible.  Chronic ulcer of left thigh ([place]) --with cellultis and infection, still mild drainage, continue packing w/ gauze. Can transition to cefdinir on discharge per ID --continue outpt wound center care and also have arranged for [name] RN.  End stage renal disease ([place]) -- Hemodialysis per nephrology.  Uncontrolled type 2 diabetes mellitus with chronic kidney disease, with long-term current use of insulin (HCC) -- CBG labile, continue basal, meal coverage and SSI  Calciphylaxis of right lower extremity with nonhealing ulcer, limited to breakdown of skin ([place]) -- Continue wound care  Essential hypertension, benign -- Stable.  Continue metoprolol  Disposition Plan:   Discussion: Improving, anticipate discharge 9/21  Status is: Inpatient  Remains inpatient appropriate because:IV treatments appropriate due to intensity of illness or inability to take PO and Inpatient level of care appropriate due to severity of illness  Dispo: The patient is from: Home              Anticipated d/c is to: Home              Patient currently is not medically stable to d/c.   Difficult to place patient No  DVT prophylaxis: heparin injection 5,000 Units Start: 08/07/21 0600   Code Status: Full Code Level of care: Med-Surg Family Communication: none   Murray Hodgkins, MD  [facility] Direct contact: see [email or web address] (further directions at bottom of note if needed) 7PM-7AM contact night coverage as at bottom of note 08/10/2021, 4:54 PM  LOS: 3 days   Significant Hospital Events   9/16 admit for LLE infected wounds   Consults:  Nephrology -- routine HD Vascular surgery -- LE wounds Orthopedics -- soft tissue infection left thigh ID   Procedures:  Routine HD  Significant Diagnostic Tests:  ABI not possible given rounds   Micro Data:  9/16 BC pending   Antimicrobials:   Ceftriaxone 9/16 >  Interval History/Subjective  CC: f/u LLE wounds  Fine today  Objective   Vitals:  Vitals:   08/10/21 1248 08/10/21 1616  BP: 112/60 119/65  Pulse: 75 78  Resp: 16 16  Temp: 98 F (36.7 C) 98.2 F (36.8 C)  SpO2: 100% 100%    Exam: Physical Exam Vitals reviewed.  Cardiovascular:     Rate and Rhythm: Normal rate.     Heart sounds: No murmur heard. Pulmonary:     Effort: Pulmonary  effort is normal. No respiratory distress.     Breath sounds: No wheezing, rhonchi or rales.  Skin:    Comments: Left thigh wound with minimal drainage, no surrounding erythema  Neurological:     Mental Status: She is alert.  Psychiatric:        Mood and Affect: Mood normal.        Behavior: Behavior normal.   I have personally reviewed the labs and other data,  making special note of:   Today's Data  CBG labile Hgb stable  Scheduled Meds:  (feeding supplement) PROSource Plus  30 mL Oral Daily   aspirin EC  81 mg Oral Q breakfast   atorvastatin  10 mg Oral QHS   Chlorhexidine Gluconate Cloth  6 each Topical Q0600   Chlorhexidine Gluconate Cloth  6 each Topical Q0600   feeding supplement (NEPRO CARB STEADY)  237 mL Oral BID BM   heparin  5,000 Units Subcutaneous Q8H   insulin aspart  0-5 Units Subcutaneous QHS   insulin aspart  0-9 Units Subcutaneous TID WC   insulin aspart  3 Units Subcutaneous TID WC   insulin glargine-yfgn  8 Units Subcutaneous QHS   levothyroxine  50 mcg Oral Q0600   metoprolol succinate  50 mg Oral QHS   pantoprazole  40 mg Oral BID   sevelamer carbonate  800 mg Oral TID WC   torsemide  150 mg Oral Daily   Zinc Oxide   Topical TID   Continuous Infusions:  cefTRIAXone (ROCEPHIN)  IV 2 g (08/10/21 1303)   sodium thiosulfate infusion for calciphylaxis Stopped (08/10/21 1230)    Principal Problem:   Calciphylaxis of left lower extremity with nonhealing ulcer with necrosis of muscle (HCC) Active Problems:   Calciphylaxis   Chronic ulcer of left thigh (HCC)   Wound infection   Uncontrolled type 2 diabetes mellitus with chronic kidney disease, with long-term current use of insulin (HCC)   End stage renal disease (HCC)   Essential hypertension, benign   Primary hypothyroidism   Non-healing open wound of heel   Blindness   Non-pressure chronic ulcer of right calf limited to breakdown of skin (HCC)   Calciphylaxis of right lower extremity with nonhealing ulcer, limited to breakdown of skin ([place])   LOS: 3 days   How to contact the [facility] Attending or Consulting provider 7A - 7P or covering provider during after hours 7P -7A, for this patient?  Check the care team in [facility] and look for a) attending/consulting TRH provider listed and b) the [facility] team listed Log into [email or web address] and use [place]'s universal password to  access. If you do not have the password, please contact the hospital operator. Locate the [facility] provider you are looking for under [facility] and page to a number that you can be directly reached. If you still have difficulty reaching the provider, please page the [facility] (Director on Call) for the Hospitalists listed on amion for assistance.

## 2021-08-11 DIAGNOSIS — L97923 Non-pressure chronic ulcer of unspecified part of left lower leg with necrosis of muscle: Secondary | ICD-10-CM | POA: Diagnosis not present

## 2021-08-11 LAB — GLUCOSE, CAPILLARY
Glucose-Capillary: 241 mg/dL — ABNORMAL HIGH (ref 70–99)
Glucose-Capillary: 300 mg/dL — ABNORMAL HIGH (ref 70–99)
Glucose-Capillary: 198 mg/dL — ABNORMAL HIGH (ref 70–99)
Glucose-Capillary: 266 mg/dL — ABNORMAL HIGH (ref 70–99)

## 2021-08-11 LAB — CULTURE, BLOOD (ROUTINE X 2)
Culture: NO GROWTH
Special Requests: ADEQUATE

## 2021-08-11 MED ORDER — CHLORHEXIDINE GLUCONATE CLOTH 2 % EX PADS: 6.0000 | MEDICATED_PAD | Freq: Every day | CUTANEOUS | Status: DC

## 2021-08-11 MED ORDER — PROSOURCE PLUS PO LIQD
30.0000 mL | Freq: Three times a day (TID) | ORAL | Status: DC
  Administered 2021-08-11: 30 mL via ORAL
  Filled 2021-08-11: qty 30

## 2021-08-11 MED ORDER — CEFDINIR 300 MG PO CAPS: 300.0000 mg | ORAL_CAPSULE | ORAL | 0 refills | Status: AC

## 2021-08-11 MED ORDER — INSULIN GLARGINE 100 UNIT/ML ~~LOC~~ SOLN: 8.0000 [IU] | Freq: Every day | SUBCUTANEOUS | 11 refills | Status: DC

## 2021-08-11 MED ORDER — ZINC OXIDE 12.8 % EX OINT: TOPICAL_OINTMENT | Freq: Three times a day (TID) | CUTANEOUS | 0 refills | Status: DC

## 2021-08-11 NOTE — Progress Notes (Signed)
Nutrition Follow-up  DOCUMENTATION CODES:   Obesity unspecified  INTERVENTION:   -D/c Nepro -Increase Prosource Plus to TID, each supplement provides 100 kcals and 15 grams protein -Continue renal MVI with minerals daily -Continue double protein portions with meals  NUTRITION DIAGNOSIS:   Increased nutrient needs related to wound healing as evidenced by estimated needs.  Ongoing  GOAL:   Patient will meet greater than or equal to 90% of their needs  Progressing   MONITOR:   PO intake, Supplement acceptance, Skin, Weight trends, Labs, I & O's  REASON FOR ASSESSMENT:   Consult Wound healing  ASSESSMENT:   50 y.o. female with past medical history of end-stage renal disease, DM, calciphylaxis, nonhealing lower extremity wounds.  Reviewed I/O's: -1.8 L x 24 hours and -5.2 L since admission  Pt siting up in bed eating lunch at time of visit. She remains with good appetite. Noted meal completions 75-100%.   Case discussed with nephrology PA, who reports pt does not tolerate Nepro well and thinks it is worsening diarrhea. RD will discontinue. Discussed other supplement options and plan of care.    Medications reviewed and include demadex and renvela.   Labs reviewed: CBGS: 174-300 (inpatient orders for glycemic control are 0-9 units insulin aspart daily at bedtime, 0-9 units insulin aspart TID with meals, 3 units insulin aspart TID with meals, and 8 units insulin glargine-yfgn daily at bedtime).    Diet Order:   Diet Order             Diet renal/carb modified with fluid restriction Diet-HS Snack? Nothing; Fluid restriction: 1200 mL Fluid; Room service appropriate? Yes; Fluid consistency: Thin  Diet effective now                   EDUCATION NEEDS:   Not appropriate for education at this time  Skin:  Skin Assessment: Skin Integrity Issues: Skin Integrity Issues:: Stage II, Incisions, Other (Comment) Stage II: R heel, buttocks Incisions: L arm Other: L leg  calciphylaxis  Last BM:  08/11/21  Height:   Ht Readings from Last 1 Encounters:  08/07/21 '5\' 6"'$  (1.676 m)    Weight:   Wt Readings from Last 1 Encounters:  08/10/21 93.4 kg   BMI:  Body mass index is 33.25 kg/m.  Estimated Nutritional Needs:   Kcal:  2050-2250  Protein:  130-145 grams  Fluid:  1 L + UOP    Elenna Spratling W, RD, LDN, [place] Registered Dietitian II Certified Diabetes Care and Education Specialist Please refer to AMION for RD and/or RD on-call/weekend/after hours pager

## 2021-08-11 NOTE — Care Management Important Message (Signed)
Important Message  Patient Details  Name: Amy Moses MRN: [id number] Date of Birth: August 20, 1971   Medicare Important Message Given:  Yes     Kearie Mennen [address] 08/11/2021, 12:22 PM

## 2021-08-11 NOTE — Progress Notes (Signed)
Inpatient Diabetes Program Recommendations  AACE/ADA: New Consensus Statement on Inpatient Glycemic Control (2015)  Target Ranges:  Prepandial:   less than 140 mg/dL      Peak postprandial:   less than 180 mg/dL (1-2 hours)      Critically ill patients:  140 - 180 mg/dL   Lab Results  Component Value Date   GLUCAP 300 (H) 08/11/2021   HGBA1C 9.5 (H) 08/07/2021   Results for ASHELYN, CROMLEY (MRN [id number]) as of 08/11/2021 14:50  Ref. Range 03/30/2021 18:41 08/07/2021 04:11  Hemoglobin A1C Latest Ref Range: 4.8 - 5.6 % 7.3 (H) 9.5 (H)   Review of Glycemic Control Results for CECELIE, MONJARAS (MRN [id number]) as of 08/11/2021 14:50  Ref. Range 08/10/2021 13:01 08/10/2021 16:30 08/10/2021 20:48 08/11/2021 06:15 08/11/2021 11:52  Glucose-Capillary Latest Ref Range: 70 - 99 mg/dL 174 (H) 227 (H) 206 (H) 241 (H) 300 (H)  Diabetes history: DM 2 Outpatient Diabetes medications:  Humalog 2 units tid with meals, Lantus 5 units q HS Current orders for Inpatient glycemic control:  Novolog 0-6 units tid with meals Novolog 2 units tid with meals Semglee 8 units q HS Inpatient Diabetes Program Recommendations:    Spoke with patient regarding elevated A1C from 7.3% to 9.5%.  Of note, Lantus was reduced from 50 units down to 5 units in June 2022.  This may be part of the reason why blood sugars/A1C higher.  Consider increasing Semglee to 10 units q HS.   Discussed glycemic control with patient and patient states it may be from reduced insulin.  She also states dietary indiscretion.    Thanks,  Adah Perl, RN, BC-ADM Inpatient Diabetes Coordinator Pager [phone]  (8a-5p)

## 2021-08-11 NOTE — Progress Notes (Signed)
[facility] Progress Note   Subjective:   Patient seen and examined at bedside.  Still waiting for compression socks.  One episode of diarrhea this AM.  She believes the Nepro may be making it worse, due to her lactose intolerance.  Will reach out to RD to see if other options for protein supplements available.  Denies CP, SOB, pain and nausea/vomiting.   Objective Vitals:   08/10/21 2050 08/11/21 0748 08/11/21 0800 08/11/21 1000  BP: 113/60 (!) 146/77  139/73  Pulse: 78 77  79  Resp: '17 15 18 18  '$ Temp: 98.1 F (36.7 C) 97.6 F (36.4 C)    TempSrc: Oral Oral    SpO2: 99% 98%  99%  Weight:      Height:       Physical Exam General:chronically ill appearing female in NAD, sitting up in bed Heart:RRR, no mrg Lungs:CTAB, nml WOB Abdomen:soft, NTND Extremities:+woody edema in b/l calves, 1+ edema hips, wounds dressed Dialysis Access: LU AVF +b/t   Filed Weights   08/08/21 1246 08/10/21 0817 08/10/21 1212  Weight: 89.5 kg 96.2 kg 93.4 kg    Intake/Output Summary (Last 24 hours) at 08/11/2021 1212 Last data filed at 08/10/2021 2050 Gross per 24 hour  Intake 720 ml  Output --  Net 720 ml    Additional Objective Labs: Basic Metabolic Panel: Recent Labs  Lab 08/06/21 1532 08/07/21 0411 08/10/21 0712  NA 134* 133* 134*  K 3.8 4.4 4.4  CL 95* 94* 97*  CO2 '28 26 25  '$ GLUCOSE 277* 302* 135*  BUN 29* 32* 56*  CREATININE 3.78* 4.12* 5.59*  CALCIUM 9.4 9.3 9.4  PHOS  --  5.5* 6.6*   Liver Function Tests: Recent Labs  Lab 08/06/21 1532 08/10/21 0712  AST 10*  --   ALT 10  --   ALKPHOS 122  --   BILITOT 0.7  --   PROT <3.0*  --   ALBUMIN 2.4* 2.1*   CBC: Recent Labs  Lab 08/06/21 1710 08/07/21 0411 08/10/21 0712  WBC 12.7* 10.6* 9.1  NEUTROABS 9.8*  --   --   HGB 10.6* 10.6* 10.7*  HCT 36.1 35.9* 35.5*  MCV 80.6 80.3 80.5  PLT 316 330 320    CBG: Recent Labs  Lab 08/10/21 1301 08/10/21 1630 08/10/21 2048 08/11/21 0615 08/11/21 1152   GLUCAP 174* 227* 206* 241* 300*   Medications:  cefTRIAXone (ROCEPHIN)  IV 2 g (08/11/21 1015)   sodium thiosulfate infusion for calciphylaxis Stopped (08/10/21 1230)    (feeding supplement) PROSource Plus  30 mL Oral Daily   aspirin EC  81 mg Oral Q breakfast   atorvastatin  10 mg Oral QHS   Chlorhexidine Gluconate Cloth  6 each Topical Q0600   feeding supplement (NEPRO CARB STEADY)  237 mL Oral BID BM   heparin  5,000 Units Subcutaneous Q8H   insulin aspart  0-5 Units Subcutaneous QHS   insulin aspart  0-9 Units Subcutaneous TID WC   insulin aspart  3 Units Subcutaneous TID WC   insulin glargine-yfgn  8 Units Subcutaneous QHS   levothyroxine  50 mcg Oral Q0600   metoprolol succinate  50 mg Oral QHS   pantoprazole  40 mg Oral BID   sevelamer carbonate  800 mg Oral TID WC   torsemide  150 mg Oral Daily   Zinc Oxide   Topical TID    Dialysis Orders: [facility] [place] [facility]  [phone]) 4hrs 400/500 2K 2.5Ca 89.5kg Hep 2000 loading,  800 qhr LU AVF EPO 8000 qHD Venofer 50qTuesday   Na thiosulfate d/c in August for break due to being on it for ~53month    Assessment/ Plan: Infected chronic leg wounds - nonhealing ulcer on L calf and infected ulcer L thigh - felt to be due to calciphylaxis. Per primary team, on IV abx. Seen by ortho, recommend compression stockings to be changed daily.  IR consulted for thigh wound/abscess aspiration - MRI reviewed, bloody purulent fluid expressed from wound but nothing seen to be amendable to US/image guided aspiration. Seen by VVS agree with wound care and IV antibiotics.  H/o calciphylaxis - not on calcium or Vit D products. Use 2Ca bath while admitted if possible. Previously on Na thiosulfate for about 967monthand was d/c about 1 month ago for a break from the medication per outpatient center. Patient believes wounds worsened during this time period.  Will restart. ESRD - on HD TTS. HD tomorrow per regular schedule. BP/ volume - BP mostly well  controlled on toprol xl 50 qd & torsemide '150mg'$  qd.  Episode of hypotension during HD yesterday, given IV albumin for BP support. Per OP unit chronic LE edema.  net UF 2L yesterday.  Continue UF as tolerated.  DM2 - per pmd Anemia ckd - Hb 10.7.  On EPO qHD as outpatient.  Will follow labs and add ESA if needed. Will hold IV iron with ongoing infection.  MBD ckd - Ca at goal.  Last phos elevated, continue renvela.  Not on VDRA due to calciphylaxis.   Nutrition - Renal diet w/fluid restrictions. Alb 2.1, protein supplements ordered. Discussed with RD who is going to change supplements d/t concern for diarrhea associated with Nepro.    LiJen MowPA-C [facility] 08/11/2021,12:12 PM  LOS: 4 days

## 2021-08-11 NOTE — Discharge Summary (Addendum)
Physician Discharge Summary  Amy Moses Was [id number] DOB: 02/01/1971 DOA: 08/06/2021  PCP: Wynelle Cleveland, MD  Admit date: 08/06/2021 Discharge date: 08/12/2021  Time spent: 40 minutes  Recommendations for Outpatient Follow-up:  Follow outpatient CBC/CMP Follow with orthopedics outpatient Follow with vascular outpatient   Consider tertiary care follow up for calciphylaxis/wounds Continue wound care, follow outpatient on abx Follow blood sugars outpatient, adjust insulin as needed sodium thiosulfate was used here by our nephrologists, continue outpatient per her nephrologists    Discharge Diagnoses:  Principal Problem:   Calciphylaxis of left lower extremity with nonhealing ulcer with necrosis of muscle ([place]) Active Problems:   Uncontrolled type 2 diabetes mellitus with chronic kidney disease, with long-term current use of insulin (HCC)   Essential hypertension, benign   Primary hypothyroidism   End stage renal disease ([place])   Non-healing open wound of heel   Calciphylaxis   Blindness   Chronic ulcer of left thigh ([place])   Wound infection   Non-pressure chronic ulcer of right calf limited to breakdown of skin ([place])   Calciphylaxis of right lower extremity with nonhealing ulcer, limited to breakdown of skin [facility])   Discharge Condition: stable  Diet recommendation: diabetic/renal   Filed Weights   08/10/21 0817 08/10/21 1212 08/12/21 0819  Weight: 96.2 kg 93.4 kg 75.8 kg    History of present illness:  50 year old woman PMH ESRD, calciphylaxis, nonhealing left lower extremity wounds followed by wound care center, sent to the emergency department for infected wound and nonhealing thigh wound, vascular studies and vascular consultation.  Seen by vascular surgery, recommended ongoing wound care, outpatient referral for higher level of care treatment team approach to calciphylaxis given severity of disease and concern for wound healing.  MRI of the left thigh wound did not  reveal significant abscess.  Seen by orthopedics. Conservative management with oral antibiotics on discharge, planning for discharge home 9/21 with wound care.  See below and previous notes for additional details  Hospital Course:  * Calciphylaxis of left lower extremity with nonhealing ulcer with necrosis of muscle (HCC) -- Bilateral lower extremity wounds, nonhealing ulcer left calf, infected ulcer left thigh.  Followed by wound care center.  Recently seen by plastic surgery as an outpatient, no plans for intervention but referral was considered to tertiary care center. --Seen by vascular surgery here and orthopedics. -- orthopedics recommending vive wear stockings, will follow outpatient (08/08/21) -- vascular recommending continued wound care, amputation only in setting of ascending infection given poor likelihood of healing - poor prospects for wound healing (9/20 vascular note) --   Recommended to continue wound care, antibiotics and consider tertiary care treatment team as an outpatient.  ABIs not possible due to wounds. -- sodium thiosulfate per renal  - MRI L femur with subcutaneous and intramuscular edema diffusely in thighs, L>R - no gas tracking in soft tissues or definite drainable abscess    Chronic ulcer of left thigh (HCC) --with cellultis and infection, still mild drainage, continue packing w/ gauze. Can transition to cefdinir on discharge per ID --continue outpt wound center care and also have arranged for [facility] RN.   End stage renal disease ([place]) -- Hemodialysis per nephrology.   Uncontrolled type 2 diabetes mellitus with chronic kidney disease, with long-term current use of insulin (HCC) -- CBG labile, continue basal, meal coverage and SSI -- increase basal to 8 units, follow outpatient    Calciphylaxis of right lower extremity with nonhealing ulcer -- Continue wound care   Essential hypertension, benign --  Stable.  Continue metoprolol     Procedures: none  Consultations: Vascular Ortho Renal   Discharge Exam: Vitals:   08/12/21 1246 08/12/21 1601  BP: (!) 148/77 (!) 111/58  Pulse: 76 79  Resp: 16 16  Temp: 97.9 F (36.6 C) 97.7 F (36.5 C)  SpO2: 99% 97%   Feels ok, no new complaints  General: No acute distress. Cardiovascular: RRR Lungs: unlabored Abdomen: Soft, nontender, nondistended  Neurological: Alert and oriented 3. Moves all extremities 4 . Cranial nerves II through XII grossly intact. Skin: Warm and dry. No rashes or lesions. Extremities: full thickness wound to bilateral calves, R heel.  L heel with unstageable wound/eschar.  L thigh with ulceration without drainage.   Discharge Instructions   Discharge Instructions     Call MD for:  difficulty breathing, headache or visual disturbances   Complete by: As directed    Call MD for:  extreme fatigue   Complete by: As directed    Call MD for:  hives   Complete by: As directed    Call MD for:  persistant dizziness or light-headedness   Complete by: As directed    Call MD for:  persistant nausea and vomiting   Complete by: As directed    Call MD for:  redness, tenderness, or signs of infection (pain, swelling, redness, odor or green/yellow discharge around incision site)   Complete by: As directed    Call MD for:  severe uncontrolled pain   Complete by: As directed    Call MD for:  temperature >100.4   Complete by: As directed    Diet - low sodium heart healthy   Complete by: As directed    Discharge instructions   Complete by: As directed    You were seen for concern for infection related to your calciphylaxis.  You've improved on antibiotics.  We'll discharge you with cefdinir.  Take this tomorrow (Friday), Saturday, and again on Monday.    Continue dressing changes as recommended by orthopedics.  Use vive wear stockings.  Change the stockings daily.  Apply Nattalie Santiesteban 4x4 over wounds over the dressing and wrap with ace wrap.  Consider  tertiary care follow up for your wounds outpatient.   Pack wound over L thigh with iodoform gauze.  Continue follow up with wound care providers.  Follow up with vascular surgery and orthopedics as well.  Your A1c was high and your blood sugars are high.  Increase your basal insulin to 8 units.  Follow your blood sugars outpatient and discuss additional changes with your PCP.  Return for new, recurrent, or worsening symptoms.   Please ask your PCP to request records from this hospitalization so they know what was done and what the next steps will be.   Discharge wound care:   Complete by: As directed    Continue dressing changes as recommended by orthopedics.  Use vive wear stockings.  Change the stockings daily.  Apply Tajai Ihde 4x4 over wounds over the dressing and wrap with ace wrap.    Pack wound over L thigh with iodoform gauze.  Continue triple paste to buttocks.   Increase activity slowly   Complete by: As directed       Allergies as of 08/12/2021       Reactions   Ace Inhibitors Cough        Medication List     TAKE these medications    acetaminophen 500 MG tablet Commonly known as: TYLENOL Take 1,000 mg by mouth every  6 (six) hours as needed for moderate pain.   aspirin EC 81 MG tablet Take 1 tablet (81 mg total) by mouth daily with breakfast.   atorvastatin 10 MG tablet Commonly known as: LIPITOR Take 1 tablet (10 mg total) by mouth daily. What changed: when to take this   cefdinir 300 MG capsule Commonly known as: OMNICEF Take 1 capsule (300 mg total) by mouth every other day for 3 doses. Take after dialysis on Thursday and Saturday.  Take final dose on Monday.   cholestyramine light 4 g packet Commonly known as: PREVALITE Take 1 packet (4 g total) by mouth 2 (two) times daily. What changed: when to take this   HumaLOG KwikPen 100 UNIT/ML KwikPen Generic drug: insulin lispro Inject 2 Units into the skin 3 (three) times daily with meals. If eats 50% or more  of meal.   insulin glargine 100 UNIT/ML injection Commonly known as: LANTUS Inject 0.08 mLs (8 Units total) into the skin at bedtime. What changed: how much to take   levothyroxine 50 MCG tablet Commonly known as: SYNTHROID Take 1 tablet (50 mcg total) by mouth daily before breakfast.   metoprolol succinate 50 MG 24 hr tablet Commonly known as: TOPROL-XL Take 1 tablet (50 mg total) by mouth at bedtime. Take with or immediately following Nehan Flaum meal.   pantoprazole 40 MG tablet Commonly known as: Protonix Take 1 tablet (40 mg total) by mouth 2 (two) times daily.   polyethylene glycol 17 g packet Commonly known as: MiraLax Take 17 g by mouth daily as needed for mild constipation.   Sarna lotion Generic drug: camphor-menthol Apply 1 application topically as needed for itching.   sevelamer carbonate 800 MG tablet Commonly known as: RENVELA Take 1 tablet (800 mg total) by mouth 3 (three) times daily with meals.   torsemide 100 MG tablet Commonly known as: DEMADEX Take 150 mg by mouth daily.   Vitamin D (Ergocalciferol) 1.25 MG (50000 UNIT) Caps capsule Commonly known as: DRISDOL Take 50,000 Units by mouth every 14 (fourteen) days.   Zinc Oxide 12.8 % ointment Commonly known as: TRIPLE PASTE Apply topically 3 (three) times daily. To buttocks               Discharge Care Instructions  (From admission, onward)           Start     Ordered   08/11/21 0000  Discharge wound care:       Comments: Continue dressing changes as recommended by orthopedics.  Use vive wear stockings.  Change the stockings daily.  Apply Zalmen Wrightsman 4x4 over wounds over the dressing and wrap with ace wrap.    Pack wound over L thigh with iodoform gauze.  Continue triple paste to buttocks.   08/11/21 1557           Allergies  Allergen Reactions   Ace Inhibitors Cough    Follow-up Information     Care, [facility] Follow up.   Why: Start of care will be within 48 hours post  discharge. Office will call and setup appointment time. Questions please call [phone] Contact information: [place] [phone] [phone]         Wynelle Cleveland, MD Follow up.   Specialty: Internal Medicine        Newt Minion, MD Follow up.   Specialty: Orthopedic Surgery Why: Call for Avaiyah Strubel follow up appointment Contact information: [place] [address] [phone]  [facility] Follow up.   Contact information: [address] [place] [id number]        Cindra Presume, MD Follow up.   Specialty: Plastic Surgery Why: Follow up with Dr. Oretha Ellis information: [place] [address] [phone]                  The results of significant diagnostics from this hospitalization (including imaging, microbiology, ancillary and laboratory) are listed below for reference.    Significant Diagnostic Studies: MR FEMUR LEFT WO CONTRAST  Result Date: 08/07/2021 CLINICAL DATA:  Soft tissue infection EXAM: MR OF THE LEFT FEMUR WITHOUT CONTRAST TECHNIQUE: Multiplanar, multisequence MR imaging of the left femur/thigh was performed. No intravenous contrast was administered. COMPARISON:  10/29/2020 FINDINGS: Bones/Joint/Cartilage Boundary artifact obscures the hips and knees, but the femur is well visualized from the intertrochanteric region to the distal metaphysis. No marrow edema in this vicinity to indicate osteomyelitis. Ligaments N/Jaydynn Wolford Muscles and Tendons There is diffuse intramuscular edema involving the anterior, medial, and posterior compartments of the left thigh in Mckena Chern roughly similar manner to the prior exam from 10/29/2020, with some mildly lesser degree of involvement in the hamstring musculature and hip adductor musculature although both are still involved especially along the periphery of the muscle bellies. Myositis and fasciitis are not  excluded but I do not see obvious signs of gas or well-defined loculated components of abscess, and the similar appearance to December indicates that this could be Adreonna Yontz chronic appearance at least partially relating to neurogenic edema or severe third spacing of fluid rather than necessarily being infectious. On the coronal images there is Eirik Schueler similar although mildly less striking appearance of edema in the right thigh musculature. Soft tissues Marked diffuse infiltrative subcutaneous edema throughout the thigh, increased from 10/29/2020, and tracking up into the buttock and anterior abdominal wall subcutaneous tissues. Similar appearance on the right side. IMPRESSION: 1. Bilateral marked subcutaneous and intramuscular edema diffusely in the thighs, with the intramuscular edema slightly greater on the left than the right. I do not see gas tracking in the soft tissues or Shantrell Placzek definite drainable abscess, and there is no evidence of osteomyelitis. Clearly in this setting, it is not feasible to exclude myositis or fasciitis by imaging given the diffuse abnormality, although other causes such as severe third spacing of fluid, or lymphatic or venous obstruction, could also cause this type of severe diffuse edema. Navi Ewton similar appearance was also present on 10/29/2020, although the degree of subcutaneous edema is worsened today. Electronically Signed   By: Van Clines M.D.   On: 08/07/2021 18:35    Microbiology: Recent Results (from the past 240 hour(s))  Blood culture (routine x 2)     Status: None   Collection Time: 08/06/21  3:35 PM   Specimen: BLOOD  Result Value Ref Range Status   Specimen Description BLOOD SITE NOT SPECIFIED  Final   Special Requests   Final    BOTTLES DRAWN AEROBIC AND ANAEROBIC Blood Culture adequate volume   Culture   Final    NO GROWTH 5 DAYS Performed at [facility], [address], [place], [place] [address]    Report Status 08/11/2021 FINAL  Final  Resp Panel by RT-PCR (Flu  Jasmaine Rochel&B, Covid) Nasopharyngeal Swab     Status: None   Collection Time: 08/07/21  2:50 AM   Specimen: Nasopharyngeal Swab; Nasopharyngeal(NP) swabs in vial transport medium  Result Value Ref  Range Status   SARS Coronavirus 2 by RT PCR NEGATIVE NEGATIVE Final    Comment: (NOTE) SARS-CoV-2 target nucleic acids are NOT DETECTED.  The SARS-CoV-2 RNA is generally detectable in upper respiratory specimens during the acute phase of infection. The lowest concentration of SARS-CoV-2 viral copies this assay can detect is 138 copies/mL. Mckenna Boruff negative result does not preclude SARS-Cov-2 infection and should not be used as the sole basis for treatment or other patient management decisions. Allison Silva negative result may occur with  improper specimen collection/handling, submission of specimen other than nasopharyngeal swab, presence of viral mutation(s) within the areas targeted by this assay, and inadequate number of viral copies(<138 copies/mL). Cing  negative result must be combined with clinical observations, patient history, and epidemiological information. The expected result is Negative.  Fact Sheet for Patients:  EntrepreneurPulse.com.au  Fact Sheet for Healthcare Providers:  IncredibleEmployment.be  This test is no t yet approved or cleared by the [place] FDA and  has been authorized for detection and/or diagnosis of SARS-CoV-2 by FDA under an Emergency Use Authorization (EUA). This EUA will remain  in effect (meaning this test can be used) for the duration of the COVID-19 declaration under Section 564(b)(1) of the Act, 21 U.S.C.section 360bbb-3(b)(1), unless the authorization is terminated  or revoked sooner.       Influenza Ajee Heasley by PCR NEGATIVE NEGATIVE Final   Influenza B by PCR NEGATIVE NEGATIVE Final    Comment: (NOTE) The Xpert Xpress SARS-CoV-2/FLU/RSV plus assay is intended as an aid in the diagnosis of influenza from Nasopharyngeal swab specimens  and should not be used as Hannan Tetzlaff sole basis for treatment. Nasal washings and aspirates are unacceptable for Xpert Xpress SARS-CoV-2/FLU/RSV testing.  Fact Sheet for Patients: EntrepreneurPulse.com.au  Fact Sheet for Healthcare Providers: IncredibleEmployment.be  This test is not yet approved or cleared by the [place] FDA and has been authorized for detection and/or diagnosis of SARS-CoV-2 by FDA under an Emergency Use Authorization (EUA). This EUA will remain in effect (meaning this test can be used) for the duration of the COVID-19 declaration under Section 564(b)(1) of the Act, 21 U.S.C. section 360bbb-3(b)(1), unless the authorization is terminated or revoked.  Performed at [facility], [place] [address], [place], [place] [address]   Blood culture (routine x 2)     Status: None   Collection Time: 08/07/21  6:56 AM   Specimen: BLOOD RIGHT ARM  Result Value Ref Range Status   Specimen Description BLOOD RIGHT ARM  Final   Special Requests   Final    BOTTLES DRAWN AEROBIC AND ANAEROBIC Blood Culture adequate volume   Culture   Final    NO GROWTH 5 DAYS Performed at [facility], [place] [address], [place], [place] [address]    Report Status 08/12/2021 FINAL  Final     Labs: Basic Metabolic Panel: Recent Labs  Lab 08/06/21 1532 08/07/21 0411 08/10/21 0712 08/12/21 0730  NA 134* 133* 134* 132*  K 3.8 4.4 4.4 4.6  CL 95* 94* 97* 95*  CO2 '28 26 25 '$ 20*  GLUCOSE 277* 302* 135* 304*  BUN 29* 32* 56* 60*  CREATININE 3.78* 4.12* 5.59* 5.04*  CALCIUM 9.4 9.3 9.4 9.8  PHOS  --  5.5* 6.6* 6.9*   Liver Function Tests: Recent Labs  Lab 08/06/21 1532 08/10/21 0712 08/12/21 0730  AST 10*  --   --   ALT 10  --   --   ALKPHOS 122  --   --  BILITOT 0.7  --   --   PROT <3.0*  --   --   ALBUMIN 2.4* 2.1* 2.4*   No results for input(s): LIPASE, AMYLASE in the last 168 hours. No results for input(s): AMMONIA in the last 168  hours. CBC: Recent Labs  Lab 08/06/21 1710 08/07/21 0411 08/10/21 0712 08/12/21 0730  WBC 12.7* 10.6* 9.1 8.1  NEUTROABS 9.8*  --   --   --   HGB 10.6* 10.6* 10.7* 10.1*  HCT 36.1 35.9* 35.5* 33.4*  MCV 80.6 80.3 80.5 79.3*  PLT 316 330 320 294   Cardiac Enzymes: No results for input(s): CKTOTAL, CKMB, CKMBINDEX, TROPONINI in the last 168 hours. BNP: BNP (last 3 results) Recent Labs    03/30/21 1658  BNP 1,068.9*    ProBNP (last 3 results) No results for input(s): PROBNP in the last 8760 hours.  CBG: Recent Labs  Lab 08/11/21 1602 08/11/21 2121 08/12/21 0653 08/12/21 1246 08/12/21 1603  GLUCAP 198* 266* 249* 207* 256*       Signed:  Fayrene Helper MD.  [facility] 08/12/2021, 5:35 PM

## 2021-08-12 DIAGNOSIS — L97923 Non-pressure chronic ulcer of unspecified part of left lower leg with necrosis of muscle: Secondary | ICD-10-CM | POA: Diagnosis not present

## 2021-08-12 LAB — RENAL FUNCTION PANEL
Albumin: 2.4 g/dL — ABNORMAL LOW (ref 3.5–5.0)
Anion gap: 17 — ABNORMAL HIGH (ref 5–15)
BUN: 60 mg/dL — ABNORMAL HIGH (ref 6–20)
CO2: 20 mmol/L — ABNORMAL LOW (ref 22–32)
Calcium: 9.8 mg/dL (ref 8.9–10.3)
Chloride: 95 mmol/L — ABNORMAL LOW (ref 98–111)
Creatinine, Ser: 5.04 mg/dL — ABNORMAL HIGH (ref 0.44–1.00)
GFR, Estimated: 10 mL/min — ABNORMAL LOW (ref >60)
Glucose, Bld: 304 mg/dL — ABNORMAL HIGH (ref 70–99)
Phosphorus: 6.9 mg/dL — ABNORMAL HIGH (ref 2.5–4.6)
Potassium: 4.6 mmol/L (ref 3.5–5.1)
Sodium: 132 mmol/L — ABNORMAL LOW (ref 135–145)

## 2021-08-12 LAB — CBC
HCT: 33.4 % — ABNORMAL LOW (ref 36.0–46.0)
Hemoglobin: 10.1 g/dL — ABNORMAL LOW (ref 12.0–15.0)
MCH: 24 pg — ABNORMAL LOW (ref 26.0–34.0)
MCHC: 30.2 g/dL (ref 30.0–36.0)
MCV: 79.3 fL — ABNORMAL LOW (ref 80.0–100.0)
Platelets: 294 10*3/uL (ref 150–400)
RBC: 4.21 MIL/uL (ref 3.87–5.11)
RDW: 18.3 % — ABNORMAL HIGH (ref 11.5–15.5)
WBC: 8.1 10*3/uL (ref 4.0–10.5)
nRBC: 0 % (ref 0.0–0.2)

## 2021-08-12 LAB — CULTURE, BLOOD (ROUTINE X 2)
Culture: NO GROWTH
Special Requests: ADEQUATE

## 2021-08-12 LAB — GLUCOSE, CAPILLARY
Glucose-Capillary: 249 mg/dL — ABNORMAL HIGH (ref 70–99)
Glucose-Capillary: 207 mg/dL — ABNORMAL HIGH (ref 70–99)
Glucose-Capillary: 256 mg/dL — ABNORMAL HIGH (ref 70–99)

## 2021-08-12 MED ORDER — HEPARIN SODIUM (PORCINE) 1000 UNIT/ML DIALYSIS
3000.0000 [IU] | INTRAMUSCULAR | Status: DC | PRN
  Filled 2021-08-12: qty 3

## 2021-08-12 MED ORDER — LIDOCAINE-PRILOCAINE 2.5-2.5 % EX CREA: 1.0000 "application " | TOPICAL_CREAM | CUTANEOUS | Status: DC | PRN

## 2021-08-12 MED ORDER — PENTAFLUOROPROP-TETRAFLUOROETH EX AERO: 1.0000 "application " | INHALATION_SPRAY | CUTANEOUS | Status: DC | PRN

## 2021-08-12 MED ORDER — ALTEPLASE 2 MG IJ SOLR: 2.0000 mg | Freq: Once | INTRAMUSCULAR | Status: DC | PRN

## 2021-08-12 MED ORDER — SODIUM CHLORIDE 0.9 % IV SOLN: 100.0000 mL | INTRAVENOUS | Status: DC | PRN

## 2021-08-12 MED ORDER — LIDOCAINE HCL (PF) 1 % IJ SOLN: 5.0000 mL | INTRAMUSCULAR | Status: DC | PRN

## 2021-08-12 MED ORDER — HEPARIN SODIUM (PORCINE) 1000 UNIT/ML DIALYSIS
1000.0000 [IU] | INTRAMUSCULAR | Status: DC | PRN
  Filled 2021-08-12: qty 1

## 2021-08-12 NOTE — Progress Notes (Signed)
[facility] Progress Note   Subjective:   Patient seen and examined at bedside during dialysis.  Continues to have diarrhea with eating. No other specific complaints.  Denies CP, SOB, and nausea/vomiting.  Re weighed in bed in HD without wound vac and significantly under dry weight if correct.  Plan for d/c today.  Objective Vitals:   08/12/21 0900 08/12/21 0930 08/12/21 1000 08/12/21 1030  BP: (!) 142/75 128/68 127/64 (!) 146/73  Pulse: 73 72 71 73  Resp: '16 16 16 18  '$ Temp:      TempSrc:      SpO2:      Weight:      Height:       Physical Exam General:chronically ill appearing female, sitting up in bed in NAD Heart:RRR, no mrg Lungs:CTAB, nml WOB on RA Abdomen:soft, NTND Extremities:1+ edema in hip, woody edema in calves with scaly skin, dressings in place Dialysis Access: LU AVF in use   Filed Weights   08/10/21 0817 08/10/21 1212 08/12/21 0819  Weight: 96.2 kg 93.4 kg 75.8 kg    Intake/Output Summary (Last 24 hours) at 08/12/2021 1058 Last data filed at 08/11/2021 1200 Gross per 24 hour  Intake 366 ml  Output --  Net 366 ml    Additional Objective Labs: Basic Metabolic Panel: Recent Labs  Lab 08/07/21 0411 08/10/21 0712 08/12/21 0730  NA 133* 134* 132*  K 4.4 4.4 4.6  CL 94* 97* 95*  CO2 26 25 20*  GLUCOSE 302* 135* 304*  BUN 32* 56* 60*  CREATININE 4.12* 5.59* 5.04*  CALCIUM 9.3 9.4 9.8  PHOS 5.5* 6.6* 6.9*   Liver Function Tests: Recent Labs  Lab 08/06/21 1532 08/10/21 0712 08/12/21 0730  AST 10*  --   --   ALT 10  --   --   ALKPHOS 122  --   --   BILITOT 0.7  --   --   PROT <3.0*  --   --   ALBUMIN 2.4* 2.1* 2.4*    CBC: Recent Labs  Lab 08/06/21 1710 08/07/21 0411 08/10/21 0712 08/12/21 0730  WBC 12.7* 10.6* 9.1 8.1  NEUTROABS 9.8*  --   --   --   HGB 10.6* 10.6* 10.7* 10.1*  HCT 36.1 35.9* 35.5* 33.4*  MCV 80.6 80.3 80.5 79.3*  PLT 316 330 320 294    CBG: Recent Labs  Lab 08/11/21 0615 08/11/21 1152  08/11/21 1602 08/11/21 2121 08/12/21 0653  GLUCAP 241* 300* 198* 266* 249*    Medications:  sodium chloride     sodium chloride     cefTRIAXone (ROCEPHIN)  IV 2 g (08/11/21 1015)   sodium thiosulfate infusion for calciphylaxis 25 g (08/12/21 1050)    (feeding supplement) PROSource Plus  30 mL Oral TID BM   aspirin EC  81 mg Oral Q breakfast   atorvastatin  10 mg Oral QHS   Chlorhexidine Gluconate Cloth  6 each Topical Q0600   Chlorhexidine Gluconate Cloth  6 each Topical Q0600   heparin  5,000 Units Subcutaneous Q8H   insulin aspart  0-5 Units Subcutaneous QHS   insulin aspart  0-9 Units Subcutaneous TID WC   insulin aspart  3 Units Subcutaneous TID WC   insulin glargine-yfgn  8 Units Subcutaneous QHS   levothyroxine  50 mcg Oral Q0600   metoprolol succinate  50 mg Oral QHS   pantoprazole  40 mg Oral BID   sevelamer carbonate  800 mg Oral TID WC   torsemide  150 mg Oral Daily   Zinc Oxide   Topical TID    Dialysis Orders: [place] [place] [facility]  [id number]) 4hrs 400/500 2K 2.5Ca 89.5kg Hep 2000 loading, 800 qhr LU AVF EPO 8000 qHD Venofer 50qTuesday   Na thiosulfate d/c in August for break due to being on it for ~28month    Assessment/ Plan: Infected chronic leg wounds - nonhealing ulcer on L calf and infected ulcer L thigh - felt to be due to calciphylaxis. Per primary team, on IV abx. Seen by ortho, recommend compression stockings to be changed daily.  IR consulted for thigh wound/abscess aspiration - MRI reviewed, bloody purulent fluid expressed from wound but nothing seen to be amendable to US/image guided aspiration. Seen by [facility] agree with wound care and IV antibiotics. Restarted Na thiosulfate.  H/o calciphylaxis - not on calcium or Vit D products. Use 2Ca bath while admitted if possible. Previously on Na thiosulfate for about 923monthand was d/c about 1 month ago for a break from the medication per outpatient center. Patient believes wounds worsened during this time  period.  Restarted during admission.  ESRD - on HD TTS. HD today per regular schedule. BP/ volume - BP mostly well controlled on toprol xl 50 qd & torsemide '150mg'$  qd.  Chronic LE edema.  net UF 2L yesterday.  Continue UF as tolerated.  DM2 - per pmd Anemia ckd - Hb 10.1.  On EPO qHD as outpatient.  Will follow labs and add ESA if needed. Will hold IV iron with ongoing infection.  MBD ckd - Ca at goal.  Last phos elevated, continue renvela.  Not on VDRA due to calciphylaxis.   Nutrition - Renal diet w/fluid restrictions. Alb 2.1, protein supplements ordered. Discussed with RD who is going to change supplements d/t concern for diarrhea associated with Nepro.     LiJen MowPA-C [facility] 08/12/2021,10:58 AM  LOS: 5 days

## 2021-08-12 NOTE — Progress Notes (Signed)
Patient was discharged home, AVS was reviewed and dressing changes demonstrated with the patient and her daughter. Wound care supplied including vive stockings were provided. Patient confirmed the antibiotics were sent to the correct pharmacy. NT assisted patient to the exit. Daughter provided transportation.

## 2021-08-12 NOTE — Progress Notes (Signed)
Seen at bedside, daughter present as well  No new complaints  Vitals:   08/12/21 1246 08/12/21 1601  BP: (!) 148/77 (!) 111/58  Pulse: 76 79  Resp: 16 16  Temp: 97.9 F (36.6 C) 97.7 F (36.5 C)  SpO2: 99% 97%   Nad RRR Unlabored S/nt/nd Bilateralulcerations to calves (L worse than R).  R heel ulceration  Wound to L thigh, almost too small to pack, no drainage.  Plan for discharge today with cefdinir for left thigh wound.  Wound care as recommended by Dr. [name], vive stockings, change daily.  Vascular recommending continued wound care outpatient.  She was previously seen by plastics, who mentioned tertiary referral.  Plan for outpatient wound care.  Continued sodium thiosulfate will be per her outpatient nephrologist.   See discharge summary (9/21) for additional details

## 2021-08-16 ENCOUNTER — Other Ambulatory Visit: Payer: Self-pay

## 2021-08-16 ENCOUNTER — Ambulatory Visit (INDEPENDENT_AMBULATORY_CARE_PROVIDER_SITE_OTHER): Payer: Medicare Other | Admitting: Orthopedic Surgery

## 2021-08-16 DIAGNOSIS — L97923 Non-pressure chronic ulcer of unspecified part of left lower leg with necrosis of muscle: Secondary | ICD-10-CM

## 2021-08-16 DIAGNOSIS — L97911 Non-pressure chronic ulcer of unspecified part of right lower leg limited to breakdown of skin: Secondary | ICD-10-CM

## 2021-08-19 ENCOUNTER — Ambulatory Visit (INDEPENDENT_AMBULATORY_CARE_PROVIDER_SITE_OTHER): Payer: Medicare Other | Admitting: Orthopedic Surgery

## 2021-08-19 ENCOUNTER — Encounter: Payer: Self-pay | Admitting: Orthopedic Surgery

## 2021-08-19 DIAGNOSIS — L97923 Non-pressure chronic ulcer of unspecified part of left lower leg with necrosis of muscle: Secondary | ICD-10-CM

## 2021-08-19 DIAGNOSIS — L97911 Non-pressure chronic ulcer of unspecified part of right lower leg limited to breakdown of skin: Secondary | ICD-10-CM

## 2021-08-19 NOTE — Progress Notes (Signed)
Office Visit Note   Patient: Amy Moses           Date of Birth: 1971-02-28           MRN: [id number] Visit Date: 08/16/2021              Requested by: Wynelle Cleveland, MD No address on file PCP: Wynelle Cleveland, MD  Chief Complaint  Patient presents with   Right Leg - Open Wound    Follow up consult in the hospitals 08/08/21   Left Leg - Open Wound      HPI: Patient is a 50 year old woman who was seen in follow-up for calciphylaxis both lower extremities.  Patient was using the medical compression stockings in the hospital.  Assessment & Plan: Visit Diagnoses:  1. Calciphylaxis of right lower extremity with nonhealing ulcer, limited to breakdown of skin ([place])   2. Calciphylaxis of left lower extremity with nonhealing ulcer with necrosis of muscle ([place])     Plan: We will apply Dynaflex wraps to both legs patient has shown excellent improvement in the granulation tissue anticipate we can advance back to compression stockings at follow-up.  Follow-Up Instructions: Return in about 1 week (around 08/23/2021).   Ortho Exam  Patient is alert, oriented, no adenopathy, well-dressed, normal affect, normal respiratory effort. Examination the wound beds on both legs have approximately 90% healthy granulation tissue there is been excellent debridement of the fibrinous exudative tissue.  There is no cellulitis no odor no drainage no exposed bone or tendon.  The calf ulcers from calciphylaxis on both calfs encompassed almost the entire posterior aspect of her calfs.  Imaging: No results found. No images are attached to the encounter.  Labs: Lab Results  Component Value Date   HGBA1C 9.5 (H) 08/07/2021   HGBA1C 7.3 (H) 03/30/2021   HGBA1C 7.4 (H) 06/10/2020   ESRSEDRATE 40 (H) 08/07/2021   CRP 11.1 (H) 08/07/2021   REPTSTATUS 08/12/2021 FINAL 08/07/2021   GRAMSTAIN  07/08/2021    RARE WBC PRESENT,BOTH PMN AND MONONUCLEAR RARE GRAM POSITIVE RODS RARE GRAM POSITIVE COCCI IN  PAIRS    CULT  08/07/2021    NO GROWTH 5 DAYS Performed at [facility], [place] [address] [place], [place] [address]    LABORGA CITROBACTER KOSERI 05/21/2021   LABORGA PROTEUS MIRABILIS 05/21/2021     Lab Results  Component Value Date   ALBUMIN 2.4 (L) 08/12/2021   ALBUMIN 2.1 (L) 08/10/2021   ALBUMIN 2.4 (L) 08/06/2021   PREALBUMIN 13.7 (L) 08/07/2021    Lab Results  Component Value Date   MG 2.1 05/14/2021   MG 2.8 (H) 05/11/2021   MG 2.3 04/23/2021   No results found for: VD25OH  Lab Results  Component Value Date   PREALBUMIN 13.7 (L) 08/07/2021   CBC EXTENDED Latest Ref Rng & Units 08/12/2021 08/10/2021 08/07/2021  WBC 4.0 - 10.5 K/uL 8.1 9.1 10.6(H)  RBC 3.87 - 5.11 MIL/uL 4.21 4.41 4.47  HGB 12.0 - 15.0 g/dL 10.1(L) 10.7(L) 10.6(L)  HCT 36.0 - 46.0 % 33.4(L) 35.5(L) 35.9(L)  PLT 150 - 400 K/uL 294 320 330  NEUTROABS 1.7 - 7.7 K/uL - - -  LYMPHSABS 0.7 - 4.0 K/uL - - -     There is no height or weight on file to calculate BMI.  Orders:  No orders of the defined types were placed in this encounter.  No orders of the defined types were placed in this encounter.    Procedures: No procedures  performed  Clinical Data: No additional findings.  ROS:  All other systems negative, except as noted in the HPI. Review of Systems  Objective: Vital Signs: LMP 08/22/2015 (Approximate)   Specialty Comments:  No specialty comments available.  PMFS History: Patient Active Problem List   Diagnosis Date Noted   Wound infection    Non-pressure chronic ulcer of right calf limited to breakdown of skin ([place])    Calciphylaxis of right lower extremity with nonhealing ulcer, limited to breakdown of skin ([place])    Chronic ulcer of left thigh ([place]) 08/07/2021   Calciphylaxis of left lower extremity with nonhealing ulcer with necrosis of muscle ([place]) 08/07/2021   Lower GI bleed 05/12/2021   Blindness 05/10/2021   Shingles 05/10/2021   Acute GI bleeding 04/24/2021    Acute blood loss anemia 04/23/2021   GI bleed 04/22/2021   Fever 03/31/2021   Acute on chronic heart failure with preserved ejection fraction (HFpEF) ([place]) 03/30/2021   Pressure injury of skin 03/30/2021   End stage renal disease ([place]) 11/16/2020   Calciphylaxis 11/06/2020   Non-healing open wound of heel 11/03/2020   Diabetic foot infection ([place]) 11/01/2020   Decubitus ulcer, heel 11/01/2020   Closed nondisplaced fracture of left patella [id number]   Metabolic acidosis [id number]   Acute on chronic renal failure ([place]) 06/10/2020   Symptomatic anemia 06/10/2020   Acute pericardial effusion 06/10/2020   Other acute nonsuppurative otitis media, unspecified ear 11/29/2019   Chronic kidney disease, stage 4 (severe) ([place]) 03/05/2019   Skin ulcer, limited to breakdown of skin ([place]) 01/28/2019   Vitamin D deficiency 01/28/2019   Uncontrolled type 2 diabetes mellitus with chronic kidney disease, with long-term current use of insulin ([place]) 09/21/2015   Hyperlipidemia 09/21/2015   Essential hypertension, benign 09/21/2015   Primary hypothyroidism 09/21/2015   Iris bomb 07/31/2012   Secondary angle-closure glaucoma 07/31/2012   Past Medical History:  Diagnosis Date   Anemia    Blindness of right eye with low vision in contralateral eye    s/p victrectomy   Diabetes mellitus, type II ([place])    Dyslipidemia    Glaucoma    Hypertension    Hypothyroidism (acquired)    Kidney disease    Stage 5   Pneumonia     Family History  Problem Relation Age of Onset   Heart disease Mother    Diabetes Mother    Kidney disease Mother    Diabetes Father    Heart disease Father    Diabetes Brother    Colon cancer Neg Hx     Past Surgical History:  Procedure Laterality Date   ABDOMINAL AORTOGRAM W/LOWER EXTREMITY Bilateral 12/18/2020   Procedure: ABDOMINAL AORTOGRAM W/LOWER EXTREMITY;  Surgeon: Elam Dutch, MD;  Location: [facility];  Service: Cardiovascular;  Laterality:  Bilateral;   ANKLE FRACTURE SURGERY     AV FISTULA PLACEMENT Left 08/18/2020   Procedure: LEFT ARM BRACHIOCEPHALIC ARTERIOVENOUS (AV) FISTULA CREATION;  Surgeon: Elam Dutch, MD;  Location: [facility];  Service: Vascular;  Laterality: Left;   BIOPSY  04/24/2021   Procedure: BIOPSY;  Surgeon: Eloise Harman, DO;  [facility];  Service: Endoscopy;;   CESAREAN SECTION     CHOLECYSTECTOMY     COLONOSCOPY  04/24/2021   Surgeon: Eloise Harman, DO;  nonbleeding internal hemorrhoids, 1 large (25 mm) pedunculated transverse colon polyp (prolapse type polyp) with adherent clot and stigmata of recent bleed.   COLONOSCOPY WITH PROPOFOL N/A 05/14/2021  Procedure: COLONOSCOPY WITH PROPOFOL;  Surgeon: Daneil Dolin, MD;  [facility];  Service: Endoscopy;  Laterality: N/A;   ESOPHAGOGASTRODUODENOSCOPY (EGD) WITH PROPOFOL N/A 04/24/2021   Surgeon: Eloise Harman, DO;  duodenal erosions and gastritis biopsied (pathology with peptic duodenitis, reactive gastropathy with erosions/chronic inflammation, negative for H. pylori)   EYE SURGERY     Vatrectomy   HEMOSTASIS CLIP PLACEMENT  05/14/2021   Procedure: HEMOSTASIS CLIP PLACEMENT;  Surgeon: Daneil Dolin, MD;  [facility];  Service: Endoscopy;;   IR PERC TUN PERIT CATH WO PORT S&I /IMAG  09/15/2020   IR REMOVAL TUN CV CATH W/O FL  02/19/2021   IR US GUIDE VASC ACCESS RIGHT  09/15/2020   POLYPECTOMY  04/24/2021   Procedure: POLYPECTOMY;  Surgeon: Eloise Harman, DO;  [facility];  Service: Endoscopy;;   POLYPECTOMY  05/14/2021   Procedure: POLYPECTOMY;  Surgeon: Daneil Dolin, MD;  [facility];  Service: Endoscopy;;   TOE SURGERY     Social History   Occupational History   Not on file  Tobacco Use   Smoking status: Never   Smokeless tobacco: Never  Vaping Use   Vaping Use: Never used  Substance and Sexual Activity   Alcohol use: No   Drug use: No   Sexual activity: Yes     Birth control/protection: Condom

## 2021-08-20 ENCOUNTER — Encounter: Payer: Medicare Other | Admitting: Physician Assistant

## 2021-08-20 ENCOUNTER — Other Ambulatory Visit: Payer: Self-pay

## 2021-08-20 DIAGNOSIS — E11621 Type 2 diabetes mellitus with foot ulcer: Secondary | ICD-10-CM | POA: Diagnosis not present

## 2021-08-20 NOTE — Progress Notes (Signed)
NIGERIA, RICOTTA ([id number]) Visit Report for 08/20/2021 Arrival Information Details Patient Name: Amy Moses, Amy Moses Date of Service: 08/20/2021 9:30 AM Medical Record Number: [id number] Patient Account Number: [id number] Date of Birth/Sex: 18-May-1971 (50 y.o. F) Treating RN: Dolan Amen Primary Care Maymuna Detzel: Wynelle Cleveland Other Clinician: Referring Minette Manders: Zenon Mayo Treating Ronalda Walpole/Extender: Skipper Cliche in Treatment: 81 Visit Information History Since Last Visit Pain Present Now: No Patient Arrived: Wheel Chair Arrival Time: 09:25 Accompanied By: self Transfer Assistance: Manual Patient Identification Verified: Yes Secondary Verification Process Completed: Yes Patient Requires Transmission-Based No Precautions: Patient Has Alerts: Yes Patient Alerts: Patient on Blood Thinner ASPIRIN Electronic Signature(s) Signed: 08/20/2021 1:33:06 PM By: Dolan Amen RN Entered By: Dolan Amen on 08/20/2021 09:25:44 Amy Moses ([id number]) -------------------------------------------------------------------------------- Clinic Level of Care Assessment Details Patient Name: Amy Moses Date of Service: 08/20/2021 9:30 AM Medical Record Number: [id number] Patient Account Number: [id number] Date of Birth/Sex: 07/04/1971 (49 y.o. F) Treating RN: Donnamarie Poag Primary Care Philana Younis: Wynelle Cleveland Other Clinician: Referring Homar Weinkauf: Zenon Mayo Treating Marien Manship/Extender: Skipper Cliche in Treatment: 34 Clinic Level of Care Assessment Items TOOL 4 Quantity Score '[]'$  - Use when only an EandM is performed on FOLLOW-UP visit 0 ASSESSMENTS - Nursing Assessment / Reassessment '[]'$  - Reassessment of Co-morbidities (includes updates in patient status) 0 '[]'$  - 0 Reassessment of Adherence to Treatment Plan ASSESSMENTS - Wound and Skin Assessment / Reassessment '[]'$  - Simple Wound Assessment / Reassessment - one wound 0 X- 3 5 Complex Wound Assessment /  Reassessment - multiple wounds '[]'$  - 0 Dermatologic / Skin Assessment (not related to wound area) ASSESSMENTS - Focused Assessment '[]'$  - Circumferential Edema Measurements - multi extremities 0 '[]'$  - 0 Nutritional Assessment / Counseling / Intervention '[]'$  - 0 Lower Extremity Assessment (monofilament, tuning fork, pulses) '[]'$  - 0 Peripheral Arterial Disease Assessment (using hand held doppler) ASSESSMENTS - Ostomy and/or Continence Assessment and Care '[]'$  - Incontinence Assessment and Management 0 '[]'$  - 0 Ostomy Care Assessment and Management (repouching, etc.) PROCESS - Coordination of Care X - Simple Patient / Family Education for ongoing care 1 15 '[]'$  - 0 Complex (extensive) Patient / Family Education for ongoing care '[]'$  - 0 Staff obtains Programmer, systems, Records, Test Results / Process Orders X- 1 10 Staff telephones HHA, Nursing Homes / Clarify orders / etc '[]'$  - 0 Routine Transfer to another Facility (non-emergent condition) '[]'$  - 0 Routine Hospital Admission (non-emergent condition) '[]'$  - 0 New Admissions / Biomedical engineer / Ordering NPWT, Apligraf, etc. '[]'$  - 0 Emergency Hospital Admission (emergent condition) X- 1 10 Simple Discharge Coordination '[]'$  - 0 Complex (extensive) Discharge Coordination PROCESS - Special Needs '[]'$  - Pediatric / Minor Patient Management 0 '[]'$  - 0 Isolation Patient Management '[]'$  - 0 Hearing / Language / Visual special needs '[]'$  - 0 Assessment of Community assistance (transportation, D/C planning, etc.) '[]'$  - 0 Additional assistance / Altered mentation '[]'$  - 0 Support Surface(s) Assessment (bed, cushion, seat, etc.) INTERVENTIONS - Wound Cleansing / Measurement Cullins, Lamira ([id number]) X- 1 5 Simple Wound Cleansing - one wound '[]'$  - 0 Complex Wound Cleansing - multiple wounds X- 1 5 Wound Imaging (photographs - any number of wounds) '[]'$  - 0 Wound Tracing (instead of photographs) '[]'$  - 0 Simple Wound Measurement - one wound X- 3 5 Complex  Wound Measurement - multiple wounds INTERVENTIONS - Wound Dressings X - Small Wound Dressing one or multiple wounds 1 10 '[]'$  - 0 Medium Wound Dressing one or multiple wounds '[]'$  - 0 Large  Wound Dressing one or multiple wounds '[]'$  - 0 Application of Medications - topical '[]'$  - 0 Application of Medications - injection INTERVENTIONS - Miscellaneous '[]'$  - External ear exam 0 '[]'$  - 0 Specimen Collection (cultures, biopsies, blood, body fluids, etc.) '[]'$  - 0 Specimen(s) / Culture(s) sent or taken to Lab for analysis '[]'$  - 0 Patient Transfer (multiple staff / Civil Service fast streamer / Similar devices) '[]'$  - 0 Simple Staple / Suture removal (25 or less) '[]'$  - 0 Complex Staple / Suture removal (26 or more) '[]'$  - 0 Hypo / Hyperglycemic Management (close monitor of Blood Glucose) '[]'$  - 0 Ankle / Brachial Index (ABI) - do not check if billed separately X- 1 5 Vital Signs Has the patient been seen at the hospital within the last three years: Yes Total Score: 90 Level Of Care: New/Established - Level 3 Electronic Signature(s) Signed: 08/20/2021 12:48:12 PM By: Donnamarie Poag Entered By: Donnamarie Poag on 08/20/2021 09:58:53 Amy Moses ([id number]) -------------------------------------------------------------------------------- Encounter Discharge Information Details Patient Name: Amy Moses Date of Service: 08/20/2021 9:30 AM Medical Record Number: [id number] Patient Account Number: [id number] Date of Birth/Sex: 03-May-1971 (49 y.o. F) Treating RN: Donnamarie Poag Primary Care Annakate Soulier: Wynelle Cleveland Other Clinician: Referring Dietrich Ke: Zenon Mayo Treating Finesse Fielder/Extender: Skipper Cliche in Treatment: 37 Encounter Discharge Information Items Discharge Condition: Stable Ambulatory Status: Wheelchair Discharge Destination: Home Transportation: Ambulance Accompanied By: self Schedule Follow-up Appointment: Yes Clinical Summary of Care: Electronic Signature(s) Signed: 08/20/2021 9:59:46 AM By:  Donnamarie Poag Entered By: Donnamarie Poag on 08/20/2021 09:59:46 Amy Moses ([id number]) -------------------------------------------------------------------------------- Lower Extremity Assessment Details Patient Name: Amy Moses Date of Service: 08/20/2021 9:30 AM Medical Record Number: [id number] Patient Account Number: [id number] Date of Birth/Sex: 12/18/1970 (49 y.o. F) Treating RN: Dolan Amen Primary Care Krishna Dancel: Wynelle Cleveland Other Clinician: Referring Cristi Gwynn: Zenon Mayo Treating Kuulei Kleier/Extender: Skipper Cliche in Treatment: 37 Notes unable to assess d/t legs being wrapped by ortho surgeon Electronic Signature(s) Signed: 08/20/2021 1:33:06 PM By: Dolan Amen RN Entered By: Dolan Amen on 08/20/2021 09:31:08 Amy Moses ([id number]) -------------------------------------------------------------------------------- Multi Wound Chart Details Patient Name: Amy Moses Date of Service: 08/20/2021 9:30 AM Medical Record Number: [id number] Patient Account Number: [id number] Date of Birth/Sex: 1971/06/10 (49 y.o. F) Treating RN: Donnamarie Poag Primary Care Shequita Peplinski: Wynelle Cleveland Other Clinician: Referring Kimimila Tauzin: Zenon Mayo Treating Nixon Kolton/Extender: Skipper Cliche in Treatment: 55 Vital Signs Height(in): 23 Pulse(bpm): 102 Weight(lbs): 235 Blood Pressure(mmHg): 152/91 Body Mass Index(BMI): 38 Temperature(F): 98.2 Respiratory Rate(breaths/min): 16 Photos: [14:No Photos] [15:No Photos] [2:No Photos] Wound Location: [14:Right Gluteus] [15:Left Gluteal fold] [2:Left, Medial Upper Leg] Wounding Event: [14:Gradually Appeared] [15:Gradually Appeared] [2:Gradually Appeared] Primary Etiology: [14:Pressure Ulcer] [15:Pressure Ulcer] [2:Calciphylaxis] Comorbid History: [14:Cataracts, Glaucoma, Anemia, Hypertension, Type II Diabetes, End Hypertension, Type II Diabetes, End Hypertension, Type II Diabetes, End Stage Renal Disease, Neuropathy]  [15:Cataracts, Glaucoma, Anemia, Stage Renal Disease,  Neuropathy] [2:Cataracts, Glaucoma, Anemia, Stage Renal Disease, Neuropathy] Date Acquired: [14:05/28/2021] [15:06/02/2021] [2:07/22/2020] Weeks of Treatment: [14:11] [15:9] [2:37] Wound Status: [14:Healed - Epithelialized] [15:Healed - Epithelialized] [2:Healed - Epithelialized] Clustered Wound: [14:No] [15:Yes] [2:No] Measurements L x W x D (cm) [14:0x0x0] [15:0x0x0] [2:0x0x0] Area (cm) : [14:0] [15:0] [2:0] Volume (cm) : [14:0] [15:0] [2:0] % Reduction in Area: [14:100.00%] [15:100.00%] [2:100.00%] % Reduction in Volume: [14:100.00%] [15:100.00%] [2:100.00%] Classification: [14:Category/Stage II] [15:Category/Stage III] [2:Full Thickness Without Exposed Support Structures] Exudate Amount: [14:None Present] [15:None Present] [2:None Present] Exudate Type: [14:N/A] [15:N/A] [2:N/A] Exudate Color: [14:N/A] [15:N/A] [2:N/A] Granulation Amount: [14:None Present (0%)] [15:None Present (0%)] [2:None Present (0%)] Granulation  Quality: [14:N/A] [15:N/A] [2:N/A] Necrotic Amount: [14:None Present (0%)] [15:None Present (0%)] [2:None Present (0%)] Exposed Structures: [14:Fascia: No Fat Layer (Subcutaneous Tissue): Fat Layer (Subcutaneous Tissue): Fat Layer (Subcutaneous Tissue): No Tendon: No Muscle: No Joint: No Bone: No Large (67-100%)] [15:Fascia: No No Tendon: No Muscle: No Joint: No Bone: No Large (67-100%)]  [2:Fascia: No No Tendon: No Muscle: No Joint: No Bone: No Large (67-100%)] Wound Number: 3 N/A N/A Photos: N/A N/A Wound Location: Left, Lateral Upper Leg N/A N/A Wounding Event: Gradually Appeared N/A N/A Primary Etiology: Calciphylaxis N/A N/A Comorbid History: Cataracts, Glaucoma, Anemia, N/A N/A Hypertension, Type II Diabetes, End Stage Renal Disease, Neuropathy Date Acquired: 07/22/2020 N/A N/A Weeks of Treatment: 37 N/A N/A Veley, Li ([id number]) Wound Status: Open N/A N/A Clustered Wound: Yes N/A N/A Measurements L x W  x D (cm) 0.5x1x0.1 N/A N/A Area (cm) : 0.393 N/A N/A Volume (cm) : 0.039 N/A N/A % Reduction in Area: 98.40% N/A N/A % Reduction in Volume: 98.40% N/A N/A Classification: Full Thickness Without Exposed N/A N/A Support Structures Exudate Amount: Medium N/A N/A Exudate Type: Sanguinous N/A N/A Exudate Color: red N/A N/A Granulation Amount: Large (67-100%) N/A N/A Granulation Quality: Red N/A N/A Necrotic Amount: None Present (0%) N/A N/A Exposed Structures: Fat Layer (Subcutaneous Tissue): N/A N/A Yes Fascia: No Tendon: No Muscle: No Joint: No Bone: No Epithelialization: Medium (34-66%) N/A N/A Treatment Notes Electronic Signature(s) Signed: 08/20/2021 12:48:12 PM By: Donnamarie Poag Entered By: Donnamarie Poag on 08/20/2021 09:35:01 Amy Moses ([id number]) -------------------------------------------------------------------------------- [place] Details Patient Name: Amy Moses Date of Service: 08/20/2021 9:30 AM Medical Record Number: [id number] Patient Account Number: [id number] Date of Birth/Sex: 11-Dec-1970 (49 y.o. F) Treating RN: Donnamarie Poag Primary Care Malaijah Houchen: Wynelle Cleveland Other Clinician: Referring Jonique Kulig: Zenon Mayo Treating Kayliee Atienza/Extender: Skipper Cliche in Treatment: 37 Active Inactive Electronic Signature(s) Signed: 08/20/2021 12:48:12 PM By: Donnamarie Poag Entered By: Donnamarie Poag on 08/20/2021 09:34:48 Amy Moses ([id number]) -------------------------------------------------------------------------------- Pain Assessment Details Patient Name: Amy Moses Date of Service: 08/20/2021 9:30 AM Medical Record Number: [id number] Patient Account Number: [id number] Date of Birth/Sex: 07-May-1971 (49 y.o. F) Treating RN: Dolan Amen Primary Care Sutton Plake: Wynelle Cleveland Other Clinician: Referring Lain Tetterton: Zenon Mayo Treating Legacy Carrender/Extender: Skipper Cliche in Treatment: 37 Active Problems Location of Pain  Severity and Description of Pain Patient Has Paino No Site Locations Rate the pain. Current Pain Level: 0 Pain Management and Medication Current Pain Management: Electronic Signature(s) Signed: 08/20/2021 1:33:06 PM By: Dolan Amen RN Entered By: Dolan Amen on 08/20/2021 09:26:15 Amy Moses ([id number]) -------------------------------------------------------------------------------- Patient/Caregiver Education Details Patient Name: Amy Moses Date of Service: 08/20/2021 9:30 AM Medical Record Number: [id number] Patient Account Number: [id number] Date of Birth/Gender: 1971-08-15 (51 y.o. F) Treating RN: Donnamarie Poag Primary Care Physician: Wynelle Cleveland Other Clinician: Referring Physician: Zenon Mayo Treating Physician/Extender: Skipper Cliche in Treatment: 35 Education Assessment Education Provided To: Patient Education Topics Provided Basic Hygiene: Wound/Skin Impairment: Engineer, maintenance) Signed: 08/20/2021 12:48:12 PM By: Donnamarie Poag Entered By: Donnamarie Poag on 08/20/2021 09:35:18 Amy Moses ([id number]) -------------------------------------------------------------------------------- Wound Assessment Details Patient Name: Amy Moses Date of Service: 08/20/2021 9:30 AM Medical Record Number: [id number] Patient Account Number: [id number] Date of Birth/Sex: 1971/07/25 (49 y.o. F) Treating RN: Donnamarie Poag Primary Care Fayne Mcguffee: Wynelle Cleveland Other Clinician: Referring Jameria Bradway: Zenon Mayo Treating Cassadee Vanzandt/Extender: Skipper Cliche in Treatment: 37 Wound Status Wound Number: 11 Primary Calciphylaxis Etiology: Wound Location: Right, Distal, Posterior Lower Leg Wound Open Wounding Event: Gradually Appeared Status: Date Acquired: 03/21/2021  Comorbid Cataracts, Glaucoma, Anemia, Hypertension, Type II Weeks Of Treatment: 19 History: Diabetes, End Stage Renal Disease, Neuropathy Clustered Wound: No Wound Measurements %  Reduction in Area: % Reduction in Volume: Wound Description Classification: Full Thickness Without Exposed Support Structu Exudate Amount: Medium Exudate Type: Serosanguineous Exudate Color: red, brown res Assessment Notes unable to assess-wound care to bilateral lower legs by Ortho Care/Dr. [name] per pt Treatment Notes Wound #11 (Lower Leg) Wound Laterality: Right, Posterior, Distal Cleanser Peri-Wound Care Topical Primary Dressing Secondary Dressing Secured With Compression Wrap Compression Stockings Add-Ons Electronic Signature(s) Signed: 08/20/2021 9:52:56 AM By: Donnamarie Poag Entered By: Donnamarie Poag on 08/20/2021 09:52:56 Amy Moses ([id number]) -------------------------------------------------------------------------------- Wound Assessment Details Patient Name: Amy Moses Date of Service: 08/20/2021 9:30 AM Medical Record Number: [id number] Patient Account Number: [id number] Date of Birth/Sex: February 16, 1971 (49 y.o. F) Treating RN: Dolan Amen Primary Care Fate Caster: Wynelle Cleveland Other Clinician: Referring Joell Buerger: Zenon Mayo Treating Kollyns Mickelson/Extender: Skipper Cliche in Treatment: 37 Wound Status Wound Number: 14 Primary Pressure Ulcer Etiology: Wound Location: Right Gluteus Wound Healed - Epithelialized Wounding Event: Gradually Appeared Status: Date Acquired: 05/28/2021 Comorbid Cataracts, Glaucoma, Anemia, Hypertension, Type II Weeks Of Treatment: 11 History: Diabetes, End Stage Renal Disease, Neuropathy Clustered Wound: No Wound Measurements Length: (cm) 0 Width: (cm) 0 Depth: (cm) 0 Area: (cm) 0 Volume: (cm) 0 % Reduction in Area: 100% % Reduction in Volume: 100% Epithelialization: Large (67-100%) Tunneling: No Undermining: No Wound Description Classification: Category/Stage II Exudate Amount: None Present Foul Odor After Cleansing: No Slough/Fibrino No Wound Bed Granulation Amount: None Present (0%) Exposed  Structure Necrotic Amount: None Present (0%) Fascia Exposed: No Fat Layer (Subcutaneous Tissue) Exposed: No Tendon Exposed: No Muscle Exposed: No Joint Exposed: No Bone Exposed: No Treatment Notes Wound #14 (Gluteus) Wound Laterality: Right Cleanser Peri-Wound Care Topical Primary Dressing Secondary Dressing Secured With Compression Wrap Compression Stockings Add-Ons Electronic Signature(s) Signed: 08/20/2021 1:33:06 PM By: Dolan Amen RN Entered By: Dolan Amen on 08/20/2021 09:26:57 Amy Moses ([id number]) -------------------------------------------------------------------------------- Wound Assessment Details Patient Name: Amy Moses Date of Service: 08/20/2021 9:30 AM Medical Record Number: [id number] Patient Account Number: [id number] Date of Birth/Sex: 1971/07/27 (50 y.o. F) Treating RN: Dolan Amen Primary Care Shamere Campas: Wynelle Cleveland Other Clinician: Referring Allyssa Abruzzese: Zenon Mayo Treating Derico Mitton/Extender: Skipper Cliche in Treatment: 37 Wound Status Wound Number: 15 Primary Pressure Ulcer Etiology: Wound Location: Left Gluteal fold Wound Healed - Epithelialized Wounding Event: Gradually Appeared Status: Date Acquired: 06/02/2021 Comorbid Cataracts, Glaucoma, Anemia, Hypertension, Type II Weeks Of Treatment: 9 History: Diabetes, End Stage Renal Disease, Neuropathy Clustered Wound: Yes Wound Measurements Length: (cm) 0 Width: (cm) 0 Depth: (cm) 0 Area: (cm) 0 Volume: (cm) 0 % Reduction in Area: 100% % Reduction in Volume: 100% Epithelialization: Large (67-100%) Tunneling: No Undermining: No Wound Description Classification: Category/Stage III Exudate Amount: None Present Foul Odor After Cleansing: No Slough/Fibrino No Wound Bed Granulation Amount: None Present (0%) Exposed Structure Necrotic Amount: None Present (0%) Fascia Exposed: No Fat Layer (Subcutaneous Tissue) Exposed: No Tendon Exposed: No Muscle  Exposed: No Joint Exposed: No Bone Exposed: No Electronic Signature(s) Signed: 08/20/2021 1:33:06 PM By: Dolan Amen RN Entered By: Dolan Amen on 08/20/2021 09:28:07 Amy Moses ([id number]) -------------------------------------------------------------------------------- Wound Assessment Details Patient Name: Amy Moses Date of Service: 08/20/2021 9:30 AM Medical Record Number: [id number] Patient Account Number: [id number] Date of Birth/Sex: 11-07-1971 (50 y.o. F) Treating RN: Donnamarie Poag Primary Care Mayrani Khamis: Wynelle Cleveland Other Clinician: Referring Winson Eichorn: Zenon Mayo Treating Karlynn Furrow/Extender: Skipper Cliche in Treatment:  37 Wound Status Wound Number: 16 Primary Pressure Ulcer Etiology: Wound Location: Left, Distal, Plantar Foot Wound Open Wounding Event: Pressure Injury Status: Date Acquired: 07/23/2021 Comorbid Cataracts, Glaucoma, Anemia, Hypertension, Type II Weeks Of Treatment: 4 History: Diabetes, End Stage Renal Disease, Neuropathy Clustered Wound: No Wound Measurements % Reduction in Area: % Reduction in Volume: Wound Description Classification: Unstageable/Unclassified Exudate Amount: None Present Assessment Notes unable to assess-wound care to bilateral lower legs by Ortho Care/Dr. [name] per pt Treatment Notes Wound #16 (Foot) Wound Laterality: Plantar, Left, Distal Cleanser Peri-Wound Care Topical Primary Dressing Secondary Dressing Secured With Compression Wrap Compression Stockings Add-Ons Electronic Signature(s) Signed: 08/20/2021 9:53:12 AM By: Donnamarie Poag Entered By: Donnamarie Poag on 08/20/2021 09:53:12 Amy Moses ([id number]) -------------------------------------------------------------------------------- Wound Assessment Details Patient Name: Amy Moses Date of Service: 08/20/2021 9:30 AM Medical Record Number: [id number] Patient Account Number: [id number] Date of Birth/Sex: Nov 13, 1971 (49 y.o. F) Treating  RN: Donnamarie Poag Primary Care Shailah Gibbins: Wynelle Cleveland Other Clinician: Referring Kinsly Hild: Zenon Mayo Treating Allene Furuya/Extender: Skipper Cliche in Treatment: 37 Wound Status Wound Number: 17 Primary Diabetic Wound/Ulcer of the Lower Extremity Etiology: Wound Location: Right, Posterior Upper Leg Wound Open Wounding Event: Trauma Status: Date Acquired: 07/22/2021 Comorbid Cataracts, Glaucoma, Anemia, Hypertension, Type II Weeks Of Treatment: 2 History: Diabetes, End Stage Renal Disease, Neuropathy Clustered Wound: Yes Wound Measurements % Reduction in Area: % Reduction in Volume: Epithelialization: Small (1-33%) Wound Description Classification: Grade 1 Exudate Amount: Medium Exudate Type: Serous Exudate Color: amber Foul Odor After Cleansing: No Slough/Fibrino Yes Wound Bed Granulation Amount: None Present (0%) Exposed Structure Necrotic Amount: Large (67-100%) Fascia Exposed: No Necrotic Quality: Adherent Slough Fat Layer (Subcutaneous Tissue) Exposed: Yes Tendon Exposed: No Muscle Exposed: No Joint Exposed: No Bone Exposed: No Assessment Notes unable to assess-wound care to bilateral lower legs by Ortho Care/Dr. [name] per pt Treatment Notes Wound #17 (Upper Leg) Wound Laterality: Right, Posterior Cleanser Peri-Wound Care Topical Primary Dressing Secondary Dressing Secured With Compression Wrap Compression Stockings Add-Ons Electronic Signature(s) Signed: 08/20/2021 9:53:58 AM By: Donnamarie Poag Entered By: Donnamarie Poag on 08/20/2021 09:53:58 Amy Moses ([id number] Banana, Vito Berger ([id number]) -------------------------------------------------------------------------------- Wound Assessment Details Patient Name: Amy Moses Date of Service: 08/20/2021 9:30 AM Medical Record Number: [id number] Patient Account Number: [id number] Date of Birth/Sex: 1971-05-26 (50 y.o. F) Treating RN: Dolan Amen Primary Care Jerriann Schrom: Wynelle Cleveland Other  Clinician: Referring Tisa Weisel: Zenon Mayo Treating Tramell Piechota/Extender: Skipper Cliche in Treatment: 37 Wound Status Wound Number: 2 Primary Calciphylaxis Etiology: Wound Location: Left, Medial Upper Leg Wound Healed - Epithelialized Wounding Event: Gradually Appeared Status: Date Acquired: 07/22/2020 Comorbid Cataracts, Glaucoma, Anemia, Hypertension, Type II Weeks Of Treatment: 37 History: Diabetes, End Stage Renal Disease, Neuropathy Clustered Wound: No Wound Measurements Length: (cm) 0 Width: (cm) 0 Depth: (cm) 0 Area: (cm) 0 Volume: (cm) 0 % Reduction in Area: 100% % Reduction in Volume: 100% Epithelialization: Large (67-100%) Tunneling: No Undermining: No Wound Description Classification: Full Thickness Without Exposed Support Structure Exudate Amount: None Present s Foul Odor After Cleansing: No Slough/Fibrino No Wound Bed Granulation Amount: None Present (0%) Exposed Structure Necrotic Amount: None Present (0%) Fascia Exposed: No Fat Layer (Subcutaneous Tissue) Exposed: No Tendon Exposed: No Muscle Exposed: No Joint Exposed: No Bone Exposed: No Electronic Signature(s) Signed: 08/20/2021 1:33:06 PM By: Dolan Amen RN Entered By: Dolan Amen on 08/20/2021 09:29:29 Amy Moses ([id number]) -------------------------------------------------------------------------------- Wound Assessment Details Patient Name: Amy Moses Date of Service: 08/20/2021 9:30 AM Medical Record Number: [id number] Patient Account Number: [id number] Date of Birth/Sex: [birth year] (  50 y.o. F) Treating RN: Dolan Amen Primary Care Candance Bohlman: Wynelle Cleveland Other Clinician: Referring Atticus Wedin: Zenon Mayo Treating Shakeisha Horine/Extender: Skipper Cliche in Treatment: 37 Wound Status Wound Number: 3 Primary Calciphylaxis Etiology: Wound Location: Left, Lateral Upper Leg Wound Open Wounding Event: Gradually Appeared Status: Date Acquired: 07/22/2020 Comorbid  Cataracts, Glaucoma, Anemia, Hypertension, Type II Weeks Of Treatment: 37 History: Diabetes, End Stage Renal Disease, Neuropathy Clustered Wound: Yes Photos Wound Measurements Length: (cm) 0.5 Width: (cm) 1 Depth: (cm) 0.1 Area: (cm) 0.393 Volume: (cm) 0.039 % Reduction in Area: 98.4% % Reduction in Volume: 98.4% Epithelialization: Medium (34-66%) Tunneling: No Undermining: No Wound Description Classification: Full Thickness Without Exposed Support Structures Exudate Amount: Medium Exudate Type: Sanguinous Exudate Color: red Foul Odor After Cleansing: No Slough/Fibrino No Wound Bed Granulation Amount: Large (67-100%) Exposed Structure Granulation Quality: Red Fascia Exposed: No Necrotic Amount: None Present (0%) Fat Layer (Subcutaneous Tissue) Exposed: Yes Tendon Exposed: No Muscle Exposed: No Joint Exposed: No Bone Exposed: No Treatment Notes Wound #3 (Upper Leg) Wound Laterality: Left, Lateral Cleanser Normal Saline Discharge Instruction: Wash your hands with soap and water. Remove old dressing, discard into plastic bag and place into trash. Cleanse the wound with Normal Saline prior to applying a clean dressing using gauze sponges, not tissues or cotton balls. Do not scrub or use excessive force. Pat dry using gauze sponges, not tissue or cotton balls. ZENOBIA, ALDERFER ([id number]) Peri-Wound Care Topical Primary Dressing Secondary Dressing Hydrofera Blue Ready Transfer Foam, 2.5x2.5 (in/in) Discharge Instruction: Apply to wound bed over non-stick dressing. Mepilex Border Flex, 6x6 (in/in) Discharge Instruction: Apply to wound as directed. Do not cut. Secured With Compression Wrap Compression Stockings Add-Ons Electronic Signature(s) Signed: 08/20/2021 1:33:06 PM By: Dolan Amen RN Entered By: Dolan Amen on 08/20/2021 09:30:34 Amy Moses ([id number]) -------------------------------------------------------------------------------- Wound  Assessment Details Patient Name: Amy Moses Date of Service: 08/20/2021 9:30 AM Medical Record Number: [id number] Patient Account Number: [id number] Date of Birth/Sex: [birth year] (49 y.o. F) Treating RN: Donnamarie Poag Primary Care Krishon Adkison: Wynelle Cleveland Other Clinician: Referring Charnele Semple: Zenon Mayo Treating Jahzir Strohmeier/Extender: Skipper Cliche in Treatment: 41 Wound Status Wound Number: 4 Primary Pressure Ulcer Etiology: Wound Location: Right Calcaneus Wound Open Wounding Event: Gradually Appeared Status: Date Acquired: 10/21/2020 Comorbid Cataracts, Glaucoma, Anemia, Hypertension, Type II Weeks Of Treatment: 37 History: Diabetes, End Stage Renal Disease, Neuropathy Clustered Wound: No Wound Measurements % Reduction in Area: % Reduction in Volume: Wound Description Classification: Unstageable/Unclassified Exudate Amount: Large Exudate Type: Serosanguineous Exudate Color: red, brown Assessment Notes unable to assess-wound care to bilateral lower legs by Ortho Care/Dr. [name] per pt Treatment Notes Wound #4 (Calcaneus) Wound Laterality: Right Cleanser Peri-Wound Care Topical Primary Dressing Secondary Dressing Secured With Compression Wrap Compression Stockings Add-Ons Electronic Signature(s) Signed: 08/20/2021 9:53:37 AM By: Donnamarie Poag Entered By: Donnamarie Poag on 08/20/2021 09:53:37 Amy Moses ([id number]) -------------------------------------------------------------------------------- Wound Assessment Details Patient Name: Amy Moses Date of Service: 08/20/2021 9:30 AM Medical Record Number: [id number] Patient Account Number: [id number] Date of Birth/Sex: Jun 08, 1971 (49 y.o. F) Treating RN: Donnamarie Poag Primary Care Julicia Krieger: Wynelle Cleveland Other Clinician: Referring Vadie Principato: Zenon Mayo Treating Virdie Penning/Extender: Skipper Cliche in Treatment: 37 Wound Status Wound Number: 9 Primary Calciphylaxis Etiology: Wound Location: Left,  Lateral Calf Wound Open Wounding Event: Gradually Appeared Status: Date Acquired: 02/16/2021 Comorbid Cataracts, Glaucoma, Anemia, Hypertension, Type II Weeks Of Treatment: 26 History: Diabetes, End Stage Renal Disease, Neuropathy Clustered Wound: No Wound Measurements % Reduction in Area: % Reduction in Volume: Wound Description Classification: Full Thickness  With Exposed Support Structure Exudate Amount: Large Exudate Type: Serosanguineous Exudate Color: red, brown s Assessment Notes unable to assess-wound care to bilateral lower legs by Ortho Care/Dr. [name] per pt Treatment Notes Wound #9 (Calf) Wound Laterality: Left, Lateral Cleanser Peri-Wound Care Topical Primary Dressing Secondary Dressing Secured With Compression Wrap Compression Stockings Add-Ons Electronic Signature(s) Signed: 08/20/2021 9:54:16 AM By: Donnamarie Poag Entered By: Donnamarie Poag on 08/20/2021 09:54:15 Amy Moses ([id number]) -------------------------------------------------------------------------------- [place] Details Patient Name: Amy Moses Date of Service: 08/20/2021 9:30 AM Medical Record Number: [id number] Patient Account Number: [id number] Date of Birth/Sex: 02-10-1971 (50 y.o. F) Treating RN: Dolan Amen Primary Care Layanna Charo: Wynelle Cleveland Other Clinician: Referring Josee Speece: Zenon Mayo Treating Emonnie Cannady/Extender: Skipper Cliche in Treatment: 37 Vital Signs Time Taken: 09:25 Temperature (F): 98.2 Height (in): 66 Pulse (bpm): 102 Weight (lbs): 235 Respiratory Rate (breaths/min): 16 Body Mass Index (BMI): 37.9 Blood Pressure (mmHg): 152/91 Reference Range: 80 - 120 mg / dl Electronic Signature(s) Signed: 08/20/2021 1:33:06 PM By: Dolan Amen RN Entered By: Dolan Amen on 08/20/2021 09:26:07

## 2021-08-20 NOTE — Progress Notes (Addendum)
LIBBY, CANDANOZA ([id number]) Visit Report for 08/20/2021 Chief Complaint Document Details Patient Name: Amy Moses, Amy Moses Date of Service: 08/20/2021 9:30 AM Medical Record Number: [id number] Patient Account Number: [id number] Date of Birth/Sex: [birth year] (50 y.o. F) Treating RN: Donnamarie Poag Primary Care Provider: Wynelle Cleveland Other Clinician: Referring Provider: Zenon Mayo Treating Provider/Extender: Skipper Cliche in Treatment: 37 Information Obtained from: Patient Chief Complaint 12/02/2020; patient is here for review of extensive wounds on her bilateral thighs as well as an area on her right plantar heel Electronic Signature(s) Signed: 08/20/2021 9:33:31 AM By: Worthy Keeler PA-C Entered By: Worthy Keeler on 08/20/2021 09:33:31 Amy Moses ([id number]) -------------------------------------------------------------------------------- HPI Details Patient Name: Amy Moses Date of Service: 08/20/2021 9:30 AM Medical Record Number: [id number] Patient Account Number: [id number] Date of Birth/Sex: 06/23/1971 (49 y.o. F) Treating RN: Donnamarie Poag Primary Care Provider: Wynelle Cleveland Other Clinician: Referring Provider: Zenon Mayo Treating Provider/Extender: Skipper Cliche in Treatment: 59 History of Present Illness HPI Description: ADMISSION 12/02/2020 This is a 50 year old woman who is a type II diabetic. Although there are mentions of type 1 diabetes in epic clearly this woman is type II based on the fact that she was on oral agents for 10 years before starting insulin. She also is in chronic renal failure and recently started on dialysis I think in November. She relates her problems starting in September she started to develop skin excoriation on her bilateral inner thighs. She thought this was a friction phenomenon however the tissue wrist can progressively broke down. She was seen by her primary doctor on 10/21/2020 noted to have erythema on both thighs  medially and a blister. MRIs were ordered and she was put on Silvadene and gauze. She was seen in the ER on 12/12 at the [place] clinic also noted to have a right unstageable heel wound. 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 She dialyzes at [facility] in [facility] and she was started on sodium thiosulfate as far as the notes state. The patient states she gets something at dialysis every day although she has not exactly sure what they are giving her. She was noted by vein and vascular in [place] to have multiple open wounds on 11/19/2020 using Xeroform gauze. She had an angiogram booked by Dr. Oneida Alar predominantly I think because of the right heel ulcer although this was canceled by the patient because of the weather. The patient has large necrotic wounds on both inner thighs with covering black eschar. There is also an area on the left lateral thigh and an eschared area on her right plantar heel. She has been using Betadine to the right heel Xeroform to the areas on her legs. The patient has Medicaid but is able to get the Xeroform, were not really sure how she is managing this although she lives in [place] and there may be different rules for Medicaid in [place] versus [place]. We certainly would not be able to get that here. 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 I wondered whether her neuropathy is particularly affected her sensation of pain since her recent left patella fracture does not seem to have been that painful either Past medical history includes stage V chronic renal failure starting on dialysis I think in November, clearly type 2 diabetes with neuropathy, hypertension, glaucoma, vitamin D deficiency, history of cholecystectomy, edema of both legs, hypothyroidism, she dialyzes Tuesday  Thursday and Saturday at [facility] in  [place] The patient had arterial studies on 11/19/2020 I think at vein and vascular in [place]. She had biphasic waveforms throughout the thigh on the right and at the popliteal proximally and distally in the ATA but the PTA and peroneal were not visualized. On the left again biphasic waveforms up to the level of the distal popliteal but not visualized in the tibial vessels. She was felt to have adequate flow where visualized. As noted she was supposed to have an angiogram which I think is certainly indicated 01/21/3556 upon evaluation today patient actually appears to be doing okay in regard to her wounds. This is actually the first time of seeing her she saw Dr. Dellia Nims at the last visit she does have quite an extensive history based on review. With that being said I do not see any signs right now of active infection which is great news. Overall I am extremely pleased with where things stand in that regard. 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 I really feel like she may do better with Dakin's moistened gauze. I do think that we can see about ordering the Dakin's for her she is already using gauze and then wrapping with roll gauze anyway so really would not change much except for moistening some gauze and applying it to the wound bed. With that being said I do not see any signs of active infection at this time which is great news. The patient all in all seems to be in fairly good spirits all things considered. 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 Fortunately it looks like some of the eschar is loosening up so we should be able to get this removed which hopefully will help in speeding up the healing process. With that being said with regard to the patient's  gluteal region she unfortunately has new pressure ulcerations open today which I think could benefit from possibly having a air mattress. With that being said I explained to the patient that unfortunately this could still take some time as far as getting it to heal completely. There does not appear to be any signs of active infection at this time which is great news. No fevers, chills, nausea, vomiting, or diarrhea. 02/16/2021 upon evaluation today patient appears to be doing about the same in regard to her heel. Her gluteal area is actually getting better. She did have surgery in regard to her thighs bilaterally as well as her knee. It was not until she got into the operating room that it was noted that she had the wounds on the thighs. Subsequently according to the surgeons note dated 02/01/2021 they contacted the patient's daughter in order to discuss the fact they felt she needed to have the wound surgically debrided and a wound VAC placed and subsequently instead of patellar reconstruction they opted to proceed with removal of the Portion of the patella in order to get this area to heal more effectively and quickly. Overall that seems to have done quite well. 02/26/2021 upon evaluation today patient's wounds actually appear to be doing excellent pretty much across the board I am very pleased. With that being said the wounds where she has the wound vacs placed on the thighs in particular seems to be doing a very  good job as far as healing is concerned. There does not appear to be any evidence of infection which is great news and overall I am extremely pleased in that regard. No fevers, chills, nausea, vomiting, or diarrhea. 03/12/21 upon evaluation today patient appears to be doing well with regard to her wounds. I think she is doing excellent with the wound VAC and very pleased in that regard. Fortunately there does not appear to be any signs of infection I am that a try to do a little bit of light  debridement today due to some necrotic regions noted in her wounds to try to help speed up the healing process here. 04/05/2021 upon evaluation today patient appears to be doing decently well in general in regard to her wounds. She was unfortunately in the hospital from the 10th through the 12th of this month due to having high fever and subsequently they did not actually determine exactly what was going on they thought she might of just been sick or had some kind of a cold. With that being said fortunately there does not appear to be any evidence of active infection at this time which is great news. No fevers, chills, nausea, vomiting, or diarrhea. ASPEN, LAWRANCE ([id number]) 05/03/2021 upon evaluation today patient actually appears to be doing excellent for the most part in regard to the wounds on her thighs. I am extremely happy with what I see today. In fact I feel like it is probably time for [name] to discontinue the use of the wound VAC as this appears to be doing so well. The patient voiced an understanding and agreement and is definitely happy to get rid of the wound VAC. She was recently in the hospital but this was secondary to something unrelated to her wound she had a polyp which was bleeding and had to be taken care of this was causing significant issues she has since been taken off of her aspirin. 05/21/2021 upon evaluation today patient appears to be doing well with regard to her wound. She has been tolerating the dressing changes without complication with regard to the [facility] in the upper thigh region. We have been using Dakin's moistened gauze on the legs and the heel. Fortunately everything seems to be making good progress here in general which is great news. The unfortunate thing is that we have been having issues here with some necrotic tissue and drainage on the lateral portions of her legs. Fortunately there does not appear to be any signs of systemic infection although  locally there may be a little bit of infection here based on the drainage which is somewhat blue-green in nature. I am going to perform a culture postdebridement of the right calf/lower extremity region. She tolerated that today without complication. I did perform that culture as well. 06/04/2021 upon evaluation today patient appears to be doing well with regard to the wounds on her thighs in general. Fortunately there does not appear to be any signs of active infection which is great news I am very pleased in that regard. In regard to her lower extremities I do feel like that she is making some good progress here as well. This includes the use of the Dakin's moistened gauze which has done a great job as far as keeping the wounds clean at this point. Overall I am extremely happy with where we stand. 06/18/2021 upon evaluation today patient appears to be doing a little better in regard to most of her wounds in general. She  does have a couple areas again and needs some debridement as far as the left lateral leg and the right heel are concerned. Otherwise she also has a small area of fluid on the right medial thigh where this is mostly healed but nonetheless there is a small opening that is of concern here. Fortunately there is no evidence of active infection at this time. No fevers, chills, nausea, vomiting, or diarrhea. 07/08/2021 upon evaluation today patient appears to be doing quite well in regard to her wounds on the right thigh region and left medial thigh region. That is about the extent of what seems to be doing good though everything else is at least the same if not a little bit worse unfortunately. Currently that includes the right lateral thigh where she has pus draining from this area we are probably need to culture this region. There is also some depth to the area where this is draining from. She also has significant wounds on the calf region right and left of the left especially starting to  migrate and looking like it is getting worse. We been using Dakin's on this area. Her heel seems to be doing about the same there is really no significant improvement in general. 07/23/2021 upon evaluation today patient actually appears to be doing quite well in regard to her wounds in general. Most everything is actually showing signs of significant improvement. She did see plastic surgery, Dr. Claudia Desanctis yesterday. With that being said it appears based on what the patient is telling me that he really did not feel there was anything from a surgical standpoint that he had to offer as far as treatment was concerned. Again this was part of the reason that I sent her in order to determine whether or not there was anything that could be done or not. With that being said he did mention making a [facility] in [facility] to see if there is anything they had to offer but in general it sounds like that is probably not going to be the case. With all that being said the one area that we have an issue is on the left lateral thigh where unfortunately she is having a significant amount of purulent drainage there still some depth here I think we need to pack this area Hydrofera Blue rope will probably do well for her. 08/06/2021 upon evaluation today patient appears to be doing poorly in regard to her left leg in general. Unfortunately I think that she is going require light sharp debridement of the gluteal area but I think that this really is not doing too badly at all. With regard to the left leg however this is significantly worse than what I previously noted. I do believe that in fact the calf region is a lot deeper even than 2 weeks ago quite significantly and subsequently also think that she has a significant infection in the upper part of the thigh on the lateral/hip location where we noted an abscess before I did do a deep wound culture revealed multiple organisms nothing predominant. Subsequently  coupled with the fact that she is having a lot of issues with pain in this area as well and still having purulent pus despite the doxycycline I think that it really has not treated the situation she probably has a deeper abscess that needs to be further managed more aggressively than what I can do in the outpatient setting. 08/20/2021 upon evaluation today the patient actually appears to be doing  much better in regard to her left upper thigh ulceration. In regard to her legs were actually not can be taken care of those at this point as she seen Dr. Pearla Dubonnet and he is managing that currently. The good news is I think that she is doing awesome from the standpoint of the upper leg and in fact has been told by Dr. [name] that her legs lower and the heels are also doing great with good granulation tissue. Obviously this is great news and hopefully the stent in the hospital that she went for has helped tremendously with that as well as have been very concerned about her legs as well to be perfectly honest. Obviously I did not have a chance to view them today and everything in the gluteal area has completely healed. Electronic Signature(s) Signed: 08/20/2021 11:05:21 AM By: Worthy Keeler PA-C Entered By: Worthy Keeler on 08/20/2021 11:05:20 Amy Moses ([id number]) -------------------------------------------------------------------------------- Physical Exam Details Patient Name: Amy Moses Date of Service: 08/20/2021 9:30 AM Medical Record Number: [id number] Patient Account Number: [id number] Date of Birth/Sex: 04/20/1971 (49 y.o. F) Treating RN: Donnamarie Poag Primary Care Provider: Wynelle Cleveland Other Clinician: Referring Provider: Zenon Mayo Treating Provider/Extender: Skipper Cliche in Treatment: 63 Constitutional Well-nourished and well-hydrated in no acute distress. Respiratory normal breathing without difficulty. Psychiatric this patient is able to make decisions and  demonstrates good insight into disease process. Alert and Oriented x 3. pleasant and cooperative. Notes Upon inspection patient's wound again in the left upper thigh region does not show any signs of cellulitis at all and I am actually very pleased in this regard. With that being said she does appear to be doing awesome in regard to her lower legs from the standpoint of what Dr. Due to is telling her he said that she had a lot of good granulation tissue they are managing that with a compression wrap at this point. She does want to continue to use the [facility] in the heels and again I think that sounds reasonable though I have not seen the wounds they have been doing very well with the Hydrofera Blue. Nonetheless I definitely think this is something she can discuss with Dr. [name] to when she sees him. Electronic Signature(s) Signed: 08/20/2021 11:06:10 AM By: Worthy Keeler PA-C Entered By: Worthy Keeler on 08/20/2021 11:06:10 Amy Moses ([id number]) -------------------------------------------------------------------------------- Physician Orders Details Patient Name: Amy Moses Date of Service: 08/20/2021 9:30 AM Medical Record Number: [id number] Patient Account Number: [id number] Date of Birth/Sex: May 22, 1971 (49 y.o. F) Treating RN: Donnamarie Poag Primary Care Provider: Wynelle Cleveland Other Clinician: Referring Provider: Zenon Mayo Treating Provider/Extender: Skipper Cliche in Treatment: 37 Verbal / Phone Orders: No Diagnosis Coding ICD-10 Coding Code Description E83.59 Other disorders of calcium metabolism L97.118 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 L97.128 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 L89.610 Pressure ulcer of right heel, unstageable E11.42 Type 2 diabetes mellitus with diabetic polyneuropathy L89.313 Pressure ulcer of right buttock, stage 3 L89.323 Pressure ulcer of left buttock,  stage 3 L97.822 Non-pressure chronic ulcer of other part of left lower leg with fat layer exposed L97.812 Non-pressure chronic ulcer of other part of right lower leg with fat layer exposed Follow-up Appointments o Return Appointment in 2 weeks. [place]: - Continue with [facility] for wound care [place], [place]) [place] [phone] Bilateral lower legs handled by Dr. Mitzie Na Care-contact Ortho care for orders for bilateral lower legs HH  initiated from hospital d/c o [facility] for wound care. May utilize formulary equivalent dressing for wound treatment orders unless otherwise specified. Home Health Nurse may visit [facility] to address patientos wound care needs. o **Please direct any NON-WOUND related issues/requests for orders to patient's Primary Care Physician. **If current dressing causes regression in wound condition, may D/C ordered dressing product/s and apply Normal Saline Moist Dressing daily until next [place] or Other MD appointment. **Notify [facility] of regression in wound condition at [phone]. Off-Loading o Gel wheelchair cushion o Air fluidized (Group 3) - stay in the bed except for meals. o Turn and reposition every 2 hours - Recommend PREVALON BOOTS WHEN IN BED FOR OFFLOADING PRESSURE OFF LEG WOUNDS o Other: - keep pressure off of wounded areas. Wound Treatment Wound #3 - Upper Leg Wound Laterality: Left, Lateral Cleanser: Normal Saline 3 x Per Week/30 Days Discharge Instructions: Wash your hands with soap and water. Remove old dressing, discard into plastic bag and place into trash. Cleanse the wound with Normal Saline prior to applying a clean dressing using gauze sponges, not tissues or cotton balls. Do not scrub or use excessive force. Pat dry using gauze sponges, not tissue or cotton balls. Secondary Dressing: Hydrofera Blue Ready Transfer Foam, 2.5x2.5 (in/in) 3 x Per Week/30 Days Discharge  Instructions: Apply to wound bed over non-stick dressing. Secondary Dressing: Mepilex Border Flex, 6x6 (in/in) 3 x Per Week/30 Days Discharge Instructions: Apply to wound as directed. Do not cut. Notes Unable to assess bilateral lower legs as Dr. Mitzie Na Care is now handling her care of bilateral lower legs with noted bilateral wraps in place by ortho surgeon stated by pt wrapped 08/19/21 Electronic Signature(s) Signed: 08/20/2021 12:48:12 PM By: Thera Flake, Vito Berger ([id number]) Signed: 08/20/2021 3:01:26 PM By: Worthy Keeler PA-C Entered By: Donnamarie Poag on 08/20/2021 10:03:33 Amy Moses ([id number]) -------------------------------------------------------------------------------- Problem List Details Patient Name: Amy Moses Date of Service: 08/20/2021 9:30 AM Medical Record Number: [id number] Patient Account Number: [id number] Date of Birth/Sex: [birth year] (50 y.o. F) Treating RN: Donnamarie Poag Primary Care Provider: Wynelle Cleveland Other Clinician: Referring Provider: Zenon Mayo Treating Provider/Extender: Skipper Cliche in Treatment: 37 Active Problems ICD-10 Encounter Code Description Active Date MDM Diagnosis E83.59 Other disorders of calcium metabolism 12/02/2020 No Yes L97.118 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 12/02/2020 No Yes L97.128 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 12/02/2020 No Yes L89.610 Pressure ulcer of right heel, unstageable 12/02/2020 No Yes E11.42 Type 2 diabetes mellitus with diabetic polyneuropathy 12/02/2020 No Yes L89.313 Pressure ulcer of right buttock, stage 3 01/19/2021 No Yes L89.323 Pressure ulcer of left buttock, stage 3 01/19/2021 No Yes L97.822 Non-pressure chronic ulcer of other part of left lower leg with fat layer 05/03/2021 No Yes exposed L97.812 Non-pressure chronic ulcer of other part of right lower leg with fat layer 05/03/2021 No Yes exposed Inactive  Problems Resolved Problems Electronic Signature(s) Signed: 08/20/2021 9:33:25 AM By: Worthy Keeler PA-C Entered By: Worthy Keeler on 08/20/2021 09:33:25 Amy Moses ([id number]) -------------------------------------------------------------------------------- Progress Note Details Patient Name: Amy Moses Date of Service: 08/20/2021 9:30 AM Medical Record Number: [id number] Patient Account Number: [id number] Date of Birth/Sex: 1971-10-24 (49 y.o. F) Treating RN: Donnamarie Poag Primary Care Provider: Wynelle Cleveland Other Clinician: Referring Provider: Zenon Mayo Treating Provider/Extender: Skipper Cliche in Treatment: 37 Subjective Chief Complaint Information obtained from Patient 12/02/2020; patient is here for review of extensive wounds on her bilateral thighs  as well as an area on her right plantar heel History of Present Illness (HPI) ADMISSION 12/02/2020 This is a 50 year old woman who is a type II diabetic. Although there are mentions of type 1 diabetes in epic clearly this woman is type II based on the fact that she was on oral agents for 10 years before starting insulin. She also is in chronic renal failure and recently started on dialysis I think in November. She relates her problems starting in September she started to develop skin excoriation on her bilateral inner thighs. She thought this was a friction phenomenon however the tissue wrist can progressively broke down. She was seen by her primary doctor on 10/21/2020 noted to have erythema on both thighs medially and a blister. MRIs were ordered and she was put on Silvadene and gauze. She was seen in the ER on 12/12 at the [place] clinic also noted to have a right unstageable heel wound. 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 She dialyzes at [facility] in [facility] and she was started on sodium thiosulfate as far as the notes state. The patient states  she gets something at dialysis every day although she has not exactly sure what they are giving her. She was noted by vein and vascular in [place] to have multiple open wounds on 11/19/2020 using Xeroform gauze. She had an angiogram booked by Dr. Oneida Alar predominantly I think because of the right heel ulcer although this was canceled by the patient because of the weather. The patient has large necrotic wounds on both inner thighs with covering black eschar. There is also an area on the left lateral thigh and an eschared area on her right plantar heel. She has been using Betadine to the right heel Xeroform to the areas on her legs. The patient has Medicaid but is able to get the Xeroform, were not really sure how she is managing this although she lives in [place] and there may be different rules for Medicaid in [place] versus [place]. We certainly would not be able to get that here. 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 I wondered whether her neuropathy is particularly affected her sensation of pain since her recent left patella fracture does not seem to have been that painful either Past medical history includes stage V chronic renal failure starting on dialysis I think in November, clearly type 2 diabetes with neuropathy, hypertension, glaucoma, vitamin D deficiency, history of cholecystectomy, edema of both legs, hypothyroidism, she dialyzes Tuesday Thursday and Saturday at [facility] in [place] The patient had arterial studies on 11/19/2020 [facility] in [place]. She had biphasic waveforms throughout the thigh on the right and at the popliteal proximally and distally in the ATA but the PTA and peroneal were not visualized. On the left again biphasic waveforms up to the level of the distal popliteal but not visualized in the tibial vessels. She was felt to have adequate flow  where visualized. As noted she was supposed to have an angiogram which I think is certainly indicated [id number] upon evaluation today patient actually appears to be doing okay in regard to her wounds. This is actually the first time of seeing her she saw Dr. Dellia Nims at the last visit she does have quite an extensive history based on review. With that being said I do not see any signs right now of  active infection which is great news. Overall I am extremely pleased with where things stand in that regard. 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 I really feel like she may do better with Dakin's moistened gauze. I do think that we can see about ordering the Dakin's for her she is already using gauze and then wrapping with roll gauze anyway so really would not change much except for moistening some gauze and applying it to the wound bed. With that being said I do not see any signs of active infection at this time which is great news. The patient all in all seems to be in fairly good spirits all things considered. 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 Fortunately it looks like some of the eschar is loosening up so we should be able to get this removed which hopefully will help in speeding up the healing process. With that being said with regard to the patient's gluteal region she unfortunately has new pressure ulcerations open today which I think could benefit from possibly having a air mattress. With that being said I explained to the patient that unfortunately this could still take some time as far as getting it to heal completely. There does not appear to be any signs of active infection at this time which is great news. No fevers, chills, nausea, vomiting, or  diarrhea. 02/16/2021 upon evaluation today patient appears to be doing about the same in regard to her heel. Her gluteal area is actually getting better. She did have surgery in regard to her thighs bilaterally as well as her knee. It was not until she got into the operating room that it was noted that she had the wounds on the thighs. Subsequently according to the surgeons note dated 02/01/2021 they contacted the patient's daughter in order to discuss the fact they felt she needed to have the wound surgically debrided and a wound VAC placed and subsequently instead of patellar reconstruction they opted to proceed with removal of the Portion of the patella in order to get this area to heal more effectively and quickly. Overall that seems to have done quite well. 02/26/2021 upon evaluation today patient's wounds actually appear to be doing excellent pretty much across the board I am very pleased. With that being said the wounds where she has the wound vacs placed on the thighs in particular seems to be doing a very good job as far as healing is concerned. There does not appear to be any evidence of infection which is great news and overall I am extremely pleased in that regard. No fevers, chills, nausea, vomiting, or diarrhea. 03/12/21 upon evaluation today patient appears to be doing well with regard to her wounds. I think she is doing excellent with the wound VAC and very pleased in that regard. Fortunately there does not appear to be any signs of infection I am that a try to do a little bit of light debridement today due to some necrotic regions noted in her wounds to try to help speed up the healing process here. Amy Moses, Amy Moses ([id number]) 04/05/2021 upon evaluation today patient appears to be doing decently well in general in regard to her wounds. She was unfortunately in the hospital from the 10th through the 12th of this month due to having  high fever and subsequently they did not actually  determine exactly what was going on they thought she might of just been sick or had some kind of a cold. With that being said fortunately there does not appear to be any evidence of active infection at this time which is great news. No fevers, chills, nausea, vomiting, or diarrhea. 05/03/2021 upon evaluation today patient actually appears to be doing excellent for the most part in regard to the wounds on her thighs. I am extremely happy with what I see today. In fact I feel like it is probably time for [name] to discontinue the use of the wound VAC as this appears to be doing so well. The patient voiced an understanding and agreement and is definitely happy to get rid of the wound VAC. She was recently in the hospital but this was secondary to something unrelated to her wound she had a polyp which was bleeding and had to be taken care of this was causing significant issues she has since been taken off of her aspirin. 05/21/2021 upon evaluation today patient appears to be doing well with regard to her wound. She has been tolerating the dressing changes without complication with regard to the [name] in the upper thigh region. We have been using Dakin's moistened gauze on the legs and the heel. Fortunately everything seems to be making good progress here in general which is great news. The unfortunate thing is that we have been having issues here with some necrotic tissue and drainage on the lateral portions of her legs. Fortunately there does not appear to be any signs of systemic infection although locally there may be a little bit of infection here based on the drainage which is somewhat blue-green in nature. I am going to perform a culture postdebridement of the right calf/lower extremity region. She tolerated that today without complication. I did perform that culture as well. 06/04/2021 upon evaluation today patient appears to be doing well with regard to the wounds on her thighs in general.  Fortunately there does not appear to be any signs of active infection which is great news I am very pleased in that regard. In regard to her lower extremities I do feel like that she is making some good progress here as well. This includes the use of the Dakin's moistened gauze which has done a great job as far as keeping the wounds clean at this point. Overall I am extremely happy with where we stand. 06/18/2021 upon evaluation today patient appears to be doing a little better in regard to most of her wounds in general. She does have a couple areas again and needs some debridement as far as the left lateral leg and the right heel are concerned. Otherwise she also has a small area of fluid on the right medial thigh where this is mostly healed but nonetheless there is a small opening that is of concern here. Fortunately there is no evidence of active infection at this time. No fevers, chills, nausea, vomiting, or diarrhea. 07/08/2021 upon evaluation today patient appears to be doing quite well in regard to her wounds on the right thigh region and left medial thigh region. That is about the extent of what seems to be doing good though everything else is at least the same if not a little bit worse unfortunately. Currently that includes the right lateral thigh where she has pus draining from this area we are probably need to culture this region. There is also some  depth to the area where this is draining from. She also has significant wounds on the calf region right and left of the left especially starting to migrate and looking like it is getting worse. We been using Dakin's on this area. Her heel seems to be doing about the same there is really no significant improvement in general. 07/23/2021 upon evaluation today patient actually appears to be doing quite well in regard to her wounds in general. Most everything is actually showing signs of significant improvement. She did see plastic surgery, Dr. Claudia Desanctis  yesterday. With that being said it appears based on what the patient is telling me that he really did not feel there was anything from a surgical standpoint that he had to offer as far as treatment was concerned. Again this was part of the reason that I sent her in order to determine whether or not there was anything that could be done or not. With that being said he did mention making a [facility] a [facility] in [facility] to see if there is anything they had to offer but in general it sounds like that is probably not going to be the case. With all that being said the one area that we have an issue is on the left lateral thigh where unfortunately she is having a significant amount of purulent drainage there still some depth here I think we need to pack this area Hydrofera Blue rope will probably do well for her. 08/06/2021 upon evaluation today patient appears to be doing poorly in regard to her left leg in general. Unfortunately I think that she is going require light sharp debridement of the gluteal area but I think that this really is not doing too badly at all. With regard to the left leg however this is significantly worse than what I previously noted. I do believe that in fact the calf region is a lot deeper even than 2 weeks ago quite significantly and subsequently also think that she has a significant infection in the upper part of the thigh on the lateral/hip location where we noted an abscess before I did do a deep wound culture revealed multiple organisms nothing predominant. Subsequently coupled with the fact that she is having a lot of issues with pain in this area as well and still having purulent pus despite the doxycycline I think that it really has not treated the situation she probably has a deeper abscess that needs to be further managed more aggressively than what I can do in the outpatient setting. 08/20/2021 upon evaluation today the patient actually appears to be doing much  better in regard to her left upper thigh ulceration. In regard to her legs were actually not can be taken care of those at this point as she seen Dr. Pearla Dubonnet and he is managing that currently. The good news is I think that she is doing awesome from the standpoint of the upper leg and in fact has been told by Dr. [name] that her legs lower and the heels are also doing great with good granulation tissue. Obviously this is great news and hopefully the stent in the hospital that she went for has helped tremendously with that as well as have been very concerned about her legs as well to be perfectly honest. Obviously I did not have a chance to view them today and everything in the gluteal area has completely healed. Objective Constitutional Well-nourished and well-hydrated in no acute distress. Vitals Time Taken: 9:25 AM, Height:  66 in, Weight: 235 lbs, BMI: 37.9, Temperature: 98.2 F, Pulse: 102 bpm, Respiratory Rate: 16 breaths/min, Blood Pressure: 152/91 mmHg. Amy Moses, Amy Moses ([id number]) Respiratory normal breathing without difficulty. Psychiatric this patient is able to make decisions and demonstrates good insight into disease process. Alert and Oriented x 3. pleasant and cooperative. General Notes: Upon inspection patient's wound again in the left upper thigh region does not show any signs of cellulitis at all and I am actually very pleased in this regard. With that being said she does appear to be doing awesome in regard to her lower legs from the standpoint of what Dr. Due to is telling her he said that she had a lot of good granulation tissue they are managing that with a compression wrap at this point. She does want to continue to use the [facility] in the heels and again I think that sounds reasonable though I have not seen the wounds they have been doing very well with the Hydrofera Blue. Nonetheless I definitely think this is something she can discuss with Dr. [name] to when she  sees him. Integumentary (Hair, Skin) Wound #11 status is Open. Original cause of wound was Gradually Appeared. The date acquired was: 03/21/2021. The wound has been in treatment 19 weeks. The wound is located on the Right,Distal,Posterior Lower Leg. There is a medium amount of serosanguineous drainage noted. General Notes: unable to assess-wound care to bilateral lower legs by Ortho Care/Dr. [name] per pt Wound #14 status is Healed - Epithelialized. Original cause of wound was Gradually Appeared. The date acquired was: 05/28/2021. The wound has been in treatment 11 weeks. The wound is located on the Right Gluteus. The wound measures 0cm length x 0cm width x 0cm depth; 0cm^2 area and 0cm^3 volume. There is no tunneling or undermining noted. There is a none present amount of drainage noted. There is no granulation within the wound bed. There is no necrotic tissue within the wound bed. Wound #15 status is Healed - Epithelialized. Original cause of wound was Gradually Appeared. The date acquired was: 06/02/2021. The wound has been in treatment 9 weeks. The wound is located on the Left Gluteal fold. The wound measures 0cm length x 0cm width x 0cm depth; 0cm^2 area and 0cm^3 volume. There is no tunneling or undermining noted. There is a none present amount of drainage noted. There is no granulation within the wound bed. There is no necrotic tissue within the wound bed. Wound #16 status is Open. Original cause of wound was Pressure Injury. The date acquired was: 07/23/2021. The wound has been in treatment 4 weeks. The wound is located on the [place]. There is a none present amount of drainage noted. General Notes: unable to assess-wound care to bilateral lower legs by Ortho Care/Dr. [name] per pt Wound #17 status is Open. Original cause of wound was Trauma. The date acquired was: 07/22/2021. The wound has been in treatment 2 weeks. The wound is located on the Right,Posterior Upper Leg. There is Fat  Layer (Subcutaneous Tissue) exposed. There is a medium amount of serous drainage noted. There is no granulation within the wound bed. There is a large (67-100%) amount of necrotic tissue within the wound bed including Adherent Slough. General Notes: unable to assess-wound care to bilateral lower legs by Ortho Care/Dr. [name] per pt Wound #2 status is Healed - Epithelialized. Original cause of wound was Gradually Appeared. The date acquired was: 07/22/2020. The wound has been in treatment 37 weeks. The wound is located on  the Left,Medial Upper Leg. The wound measures 0cm length x 0cm width x 0cm depth; 0cm^2 area and 0cm^3 volume. There is no tunneling or undermining noted. There is a none present amount of drainage noted. There is no granulation within the wound bed. There is no necrotic tissue within the wound bed. Wound #3 status is Open. Original cause of wound was Gradually Appeared. The date acquired was: 07/22/2020. The wound has been in treatment 37 weeks. The wound is located on the Left,Lateral Upper Leg. The wound measures 0.5cm length x 1cm width x 0.1cm depth; 0.393cm^2 area and 0.039cm^3 volume. There is Fat Layer (Subcutaneous Tissue) exposed. There is no tunneling or undermining noted. There is a medium amount of sanguinous drainage noted. There is large (67-100%) red granulation within the wound bed. There is no necrotic tissue within the wound bed. Wound #4 status is Open. Original cause of wound was Gradually Appeared. The date acquired was: 10/21/2020. The wound has been in treatment 37 weeks. The wound is located on the Right Calcaneus. There is a large amount of serosanguineous drainage noted. General Notes: unable to assess-wound care to bilateral lower legs by Ortho Care/Dr. [name] per pt Wound #9 status is Open. Original cause of wound was Gradually Appeared. The date acquired was: 02/16/2021. The wound has been in treatment 26 weeks. The wound is located on the Left,Lateral Calf.  There is a large amount of serosanguineous drainage noted. General Notes: unable to assess-wound care to bilateral lower legs by Ortho Care/Dr. 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 Home Health: National: - Continue with [facility] for wound care [place], [place]) FAX [phone] Bilateral lower legs handled by Dr. Starla Link Ortho care for orders for bilateral lower legs HH initiated from hospital d/c [place] for wound care. May utilize formulary equivalent dressing for wound treatment orders unless otherwise specified. Home Health Nurse may visit [facility] to address patient s wound care needs. **Please direct any NON-WOUND related issues/requests for orders to patient's Primary Care Physician. **If current dressing causes regression in wound condition, may D/C ordered dressing product/s and apply Normal Saline Moist Dressing daily until next [place] or Other MD appointment. **Notify [facility] of regression in wound condition at [phone]. Off-Loading: Gel wheelchair cushion Air fluidized (Group 3) - stay in the bed except for meals. Turn and reposition every 2 hours - Recommend PREVALON BOOTS WHEN IN BED FOR OFFLOADING PRESSURE OFF LEG WOUNDS Other: - keep pressure off of wounded areas. General Notes: Unable to assess  bilateral lower legs as Dr. Mitzie Na Care is now handling her care of bilateral lower legs with noted bilateral wraps in place by ortho surgeon stated by pt wrapped 08/19/21 WOUND #3: - Upper Leg Wound Laterality: Left, Lateral Cleanser: Normal Saline 3 x Per Week/30 Days Discharge Instructions: Wash your hands with soap and water. Remove old dressing, discard into plastic bag and place into trash. Cleanse the wound with Normal Saline  prior to applying a clean dressing using gauze sponges, not tissues or cotton balls. Do not scrub or use excessive force. Pat dry using gauze sponges, not tissue or cotton balls. Secondary Dressing: Hydrofera Blue Ready Transfer Foam, 2.5x2.5 (in/in) 3 x Per Week/30 Days Discharge Instructions: Apply to wound bed over non-stick dressing. Secondary Dressing: Mepilex Border Flex, 6x6 (in/in) 3 x Per Week/30 Days Discharge Instructions: Apply to wound as directed. Do not cut. 1. Would recommend that we going continue with wound care measures as before and the patient is in agreement with the plan. Specifically working to be using the [employer] to the left hip everything else that I am managing has been completely closed out today. 2. With regard to the lower extremities again these wraps are being placed and managed right now by Dr. [name]. She also has home health, [facility] now that she has Medicare. Subsequently all in all I think that this is a very good option for her and I am hopeful that she will continue to show signs of excellent improvement. Obviously if we need to get back and on anything as far as management is concerned we will but right now there is really nothing more for me to do with regard to her lower extremities. We will see patient back for reevaluation in 1 week here in the clinic. If anything worsens or changes patient will contact our office for additional recommendations. Electronic Signature(s) Signed: 08/20/2021 11:07:00 AM By: Worthy Keeler PA-C Entered By: Worthy Keeler on 08/20/2021 11:06:59 Amy Moses ([id number]) -------------------------------------------------------------------------------- SuperBill Details Patient Name: Amy Moses Date of Service: 08/20/2021 Medical Record Number: [id number] Patient Account Number: [id number] Date of Birth/Sex: [birth year] (49 y.o. 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 L97.128 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 L89.610 Pressure ulcer of right heel, unstageable E11.42 Type 2 diabetes mellitus with diabetic polyneuropathy L89.313 Pressure ulcer of right buttock, stage 3 L89.323 Pressure ulcer of left buttock, stage 3 L97.822 Non-pressure chronic ulcer of other part of left lower leg with fat layer exposed L97.812 Non-pressure chronic ulcer of other part of right lower leg with fat layer exposed Facility Procedures CPT4 Code: [id number] Description: 99213 - WOUND CARE VISIT-LEV 3 EST PT Modifier: Quantity: 1 Physician Procedures CPT4 Code: [id number] Description: 99214 - WC PHYS LEVEL 4 - EST PT Modifier: Quantity: 1 CPT4 Code: Description: ICD-10 Diagnosis Description E83.59 Other disorders of calcium metabolism L97.118 Non-pressure chronic ulcer of right thigh with other specified severit L97.128 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 L89.610  Pressure ulcer of right heel, unstageable Modifier: y Quantity: Electronic Signature(s) Signed: 08/20/2021 11:07:27 AM By: Worthy Keeler PA-C Previous Signature: 08/20/2021 9:59:00 AM Version By: Donnamarie Poag Entered By: Worthy Keeler on  08/20/2021 11:07:27

## 2021-08-23 ENCOUNTER — Ambulatory Visit (INDEPENDENT_AMBULATORY_CARE_PROVIDER_SITE_OTHER): Payer: Medicare Other | Admitting: Orthopedic Surgery

## 2021-08-23 ENCOUNTER — Other Ambulatory Visit: Payer: Self-pay

## 2021-08-23 ENCOUNTER — Encounter: Payer: Self-pay | Admitting: Orthopedic Surgery

## 2021-08-23 DIAGNOSIS — L97911 Non-pressure chronic ulcer of unspecified part of right lower leg limited to breakdown of skin: Secondary | ICD-10-CM | POA: Diagnosis not present

## 2021-08-23 DIAGNOSIS — L97923 Non-pressure chronic ulcer of unspecified part of left lower leg with necrosis of muscle: Secondary | ICD-10-CM

## 2021-08-27 ENCOUNTER — Ambulatory Visit (INDEPENDENT_AMBULATORY_CARE_PROVIDER_SITE_OTHER): Payer: Medicare Other | Admitting: Family

## 2021-08-27 DIAGNOSIS — L97911 Non-pressure chronic ulcer of unspecified part of right lower leg limited to breakdown of skin: Secondary | ICD-10-CM | POA: Diagnosis not present

## 2021-08-27 DIAGNOSIS — L97923 Non-pressure chronic ulcer of unspecified part of left lower leg with necrosis of muscle: Secondary | ICD-10-CM | POA: Diagnosis not present

## 2021-08-30 ENCOUNTER — Ambulatory Visit (INDEPENDENT_AMBULATORY_CARE_PROVIDER_SITE_OTHER): Payer: Medicare Other | Admitting: Orthopedic Surgery

## 2021-08-30 ENCOUNTER — Encounter: Payer: Self-pay | Admitting: Orthopedic Surgery

## 2021-08-30 DIAGNOSIS — L97911 Non-pressure chronic ulcer of unspecified part of right lower leg limited to breakdown of skin: Secondary | ICD-10-CM

## 2021-08-30 DIAGNOSIS — L97923 Non-pressure chronic ulcer of unspecified part of left lower leg with necrosis of muscle: Secondary | ICD-10-CM

## 2021-08-30 NOTE — Progress Notes (Signed)
Office Visit Note   Patient: Amy Moses           Date of Birth: 11/30/1970           MRN: [id number] Visit Date: 08/30/2021              Requested by: Wynelle Cleveland, MD No address on file PCP: Wynelle Cleveland, MD  Chief Complaint  Patient presents with   Right Leg - Wound Check   Left Leg - Wound Check      HPI: Patient is a 50 year old woman on dialysis Tuesday Thursday Saturday with calciphylaxis ulcers of both legs.  With the serial compression wraps the ulcers continue to show improvement with good healthy bleeding granulation tissue.  Assessment & Plan: Visit Diagnoses:  1. Calciphylaxis of right lower extremity with nonhealing ulcer, limited to breakdown of skin ([name])   2. Calciphylaxis of left lower extremity with nonhealing ulcer with necrosis of muscle ([name])     Plan: Will wrap both legs today plan for surgery on Friday with further debridement and application of skin graft and wound vacs.  Anticipate dialysis in the hospital on Saturday and discharge after dialysis with follow-up in the office in 1 week.  Follow-Up Instructions: Return in about 1 week (around 09/06/2021).   Ortho Exam  Patient is alert, oriented, no adenopathy, well-dressed, normal affect, normal respiratory effort. Examination of the ulcers show improved granulation tissue there were a few areas of eschar remaining.  There is no cellulitis no odor there is good petechial bleeding.  The skin is wrinkling well.  Imaging: No results found.     Labs: Lab Results  Component Value Date   HGBA1C 9.5 (H) 08/07/2021   HGBA1C 7.3 (H) 03/30/2021   HGBA1C 7.4 (H) 06/10/2020   ESRSEDRATE 40 (H) 08/07/2021   CRP 11.1 (H) 08/07/2021   REPTSTATUS 08/12/2021 FINAL 08/07/2021   GRAMSTAIN  07/08/2021    RARE WBC PRESENT,BOTH PMN AND MONONUCLEAR RARE GRAM POSITIVE RODS RARE GRAM POSITIVE COCCI IN PAIRS    CULT  08/07/2021    NO GROWTH 5 DAYS Performed at [facility], [place]  [address] [place], [place] [address]    LABORGA CITROBACTER KOSERI 05/21/2021   LABORGA PROTEUS MIRABILIS 05/21/2021     Lab Results  Component Value Date   ALBUMIN 2.4 (L) 08/12/2021   ALBUMIN 2.1 (L) 08/10/2021   ALBUMIN 2.4 (L) 08/06/2021   PREALBUMIN 13.7 (L) 08/07/2021    Lab Results  Component Value Date   MG 2.1 05/14/2021   MG 2.8 (H) 05/11/2021   MG 2.3 04/23/2021   No results found for: VD25OH  Lab Results  Component Value Date   PREALBUMIN 13.7 (L) 08/07/2021   CBC EXTENDED Latest Ref Rng & Units 08/12/2021 08/10/2021 08/07/2021  WBC 4.0 - 10.5 K/uL 8.1 9.1 10.6(H)  RBC 3.87 - 5.11 MIL/uL 4.21 4.41 4.47  HGB 12.0 - 15.0 g/dL 10.1(L) 10.7(L) 10.6(L)  HCT 36.0 - 46.0 % 33.4(L) 35.5(L) 35.9(L)  PLT 150 - 400 K/uL 294 320 330  NEUTROABS 1.7 - 7.7 K/uL - - -  LYMPHSABS 0.7 - 4.0 K/uL - - -     There is no height or weight on file to calculate BMI.  Orders:  No orders of the defined types were placed in this encounter.  No orders of the defined types were placed in this encounter.    Procedures: No procedures performed  Clinical Data: No additional findings.  ROS:  All other systems  negative, except as noted in the HPI. Review of Systems  Objective: Vital Signs: LMP 08/22/2015 (Approximate)   Specialty Comments:  No specialty comments available.  PMFS History: Patient Active Problem List   Diagnosis Date Noted   Wound infection    Non-pressure chronic ulcer of right calf limited to breakdown of skin ([name])    Calciphylaxis of right lower extremity with nonhealing ulcer, limited to breakdown of skin ([name])    Chronic ulcer of left thigh ([name]) 08/07/2021   Calciphylaxis of left lower extremity with nonhealing ulcer with necrosis of muscle ([name]) 08/07/2021   Lower GI bleed 05/12/2021   Blindness 05/10/2021   Shingles 05/10/2021   Acute GI bleeding 04/24/2021   Acute blood loss anemia 04/23/2021   GI bleed 04/22/2021   Fever 03/31/2021   Acute on  chronic heart failure with preserved ejection fraction (HFpEF) ([name]) 03/30/2021   Pressure injury of skin 03/30/2021   End stage renal disease ([name]) 11/16/2020   Calciphylaxis 11/06/2020   Non-healing open wound of heel 11/03/2020   Diabetic foot infection ([name]) 11/01/2020   Decubitus ulcer, heel 11/01/2020   Closed nondisplaced fracture of left patella [id number]   Metabolic acidosis [id number]   Acute on chronic renal failure ([place]) 06/10/2020   Symptomatic anemia 06/10/2020   Acute pericardial effusion 06/10/2020   Other acute nonsuppurative otitis media, unspecified ear 11/29/2019   Chronic kidney disease, stage 4 (severe) ([name]) 03/05/2019   Skin ulcer, limited to breakdown of skin ([name]) 01/28/2019   Vitamin D deficiency 01/28/2019   Uncontrolled type 2 diabetes mellitus with chronic kidney disease, with long-term current use of insulin 09/21/2015   Hyperlipidemia 09/21/2015   Essential hypertension, benign 09/21/2015   Primary hypothyroidism 09/21/2015   Iris bomb 07/31/2012   Secondary angle-closure glaucoma 07/31/2012   Past Medical History:  Diagnosis Date   Anemia    Blindness of right eye with low vision in contralateral eye    s/p victrectomy   Diabetes mellitus, type II ([name])    Dyslipidemia    Glaucoma    Hypertension    Hypothyroidism (acquired)    Kidney disease    Stage 5   Pneumonia     Family History  Problem Relation Age of Onset   Heart disease Mother    Diabetes Mother    Kidney disease Mother    Diabetes Father    Heart disease Father    Diabetes Brother    Colon cancer Neg Hx     Past Surgical History:  Procedure Laterality Date   ABDOMINAL AORTOGRAM W/LOWER EXTREMITY Bilateral 12/18/2020   Procedure: ABDOMINAL AORTOGRAM W/LOWER EXTREMITY;  Surgeon: Elam Dutch, MD;  Location: [facility];  Service: Cardiovascular;  Laterality: Bilateral;   ANKLE FRACTURE SURGERY     AV FISTULA PLACEMENT Left 08/18/2020   Procedure: LEFT ARM  BRACHIOCEPHALIC ARTERIOVENOUS (AV) FISTULA CREATION;  Surgeon: Elam Dutch, MD;  Location: [facility];  Service: Vascular;  Laterality: Left;   BIOPSY  04/24/2021   Procedure: BIOPSY;  Surgeon: Eloise Harman, DO;  [facility];  Service: Endoscopy;;   CESAREAN SECTION     CHOLECYSTECTOMY     COLONOSCOPY  04/24/2021   Surgeon: Eloise Harman, DO;  nonbleeding internal hemorrhoids, 1 large (25 mm) pedunculated transverse colon polyp (prolapse type polyp) with adherent clot and stigmata of recent bleed.   COLONOSCOPY WITH PROPOFOL N/A 05/14/2021   Procedure: COLONOSCOPY WITH PROPOFOL;  Surgeon: Daneil Dolin, MD;  [facility];  Service: Endoscopy;  Laterality: N/A;   ESOPHAGOGASTRODUODENOSCOPY (EGD) WITH PROPOFOL N/A 04/24/2021   Surgeon: Eloise Harman, DO;  duodenal erosions and gastritis biopsied (pathology with peptic duodenitis, reactive gastropathy with erosions/chronic inflammation, negative for H. pylori)   EYE SURGERY     Vatrectomy   HEMOSTASIS CLIP PLACEMENT  05/14/2021   Procedure: HEMOSTASIS CLIP PLACEMENT;  Surgeon: Daneil Dolin, MD;  [facility];  Service: Endoscopy;;   IR PERC TUN PERIT CATH WO PORT S&I /IMAG  09/15/2020   IR REMOVAL TUN CV CATH W/O FL  02/19/2021   IR US GUIDE VASC ACCESS RIGHT  09/15/2020   POLYPECTOMY  04/24/2021   Procedure: POLYPECTOMY;  Surgeon: Eloise Harman, DO;  [facility];  Service: Endoscopy;;   POLYPECTOMY  05/14/2021   Procedure: POLYPECTOMY;  Surgeon: Daneil Dolin, MD;  [facility];  Service: Endoscopy;;   TOE SURGERY     Social History   Occupational History   Not on file  Tobacco Use   Smoking status: Never   Smokeless tobacco: Never  Vaping Use   Vaping Use: Never used  Substance and Sexual Activity   Alcohol use: No   Drug use: No   Sexual activity: Yes    Birth control/protection: Condom

## 2021-09-01 ENCOUNTER — Other Ambulatory Visit: Payer: Self-pay | Admitting: Family

## 2021-09-02 ENCOUNTER — Ambulatory Visit: Payer: Medicare Other | Admitting: Orthopedic Surgery

## 2021-09-02 ENCOUNTER — Encounter (HOSPITAL_COMMUNITY): Payer: Self-pay | Admitting: Orthopedic Surgery

## 2021-09-02 ENCOUNTER — Other Ambulatory Visit: Payer: Self-pay

## 2021-09-02 NOTE — Progress Notes (Signed)
PCP - Dr. Rhae Lerner Cardiologist - denies EKG - 08/07/21 Chest x-ray -  ECHO - 04/01/21 Cardiac Cath -  CPAP -   Fasting Blood Sugar:  200s Checks Blood Sugar:  1x/day  Blood Thinner Instructions:  Aspirin Instructions: ASA none DOS  ERAS Protcol - 0750  COVID TEST- DOS  Anesthesia review: n/a  -------------  SDW INSTRUCTIONS:  Your procedure is scheduled on Friday 10/14. [facility] Entr[facility]" at 0750 A.M., and check in at the Admitting office. Call this number if you have problems the morning of surgery: [phone]   Remember: Do not eat after midnight the night before your surgery  You may drink clear liquids until 0750 AM the morning of your surgery.   Clear liquids allowed are: Water, Non-Citrus Juices (without pulp), Carbonated Beverages, Clear Tea, Black Coffee Only, and Gatorade   Medications to take morning of surgery with a sip of water include: levothyroxine (SYNTHROID) pantoprazole (PROTONIX)  If needed: acetaminophen (TYLENOL)  As of today, STOP taking any Aspirin (unless otherwise instructed by your surgeon), Aleve, Naproxen, Ibuprofen, Motrin, Advil, Goody's, BC's, all herbal medications, fish oil, and all vitamins.   ** PLEASE check your blood sugar the morning of your surgery when you wake up and every 2 hours until you get to the Short Stay unit.  If your blood sugar is less than 70 mg/dL, you will need to treat for low blood sugar: Do not take insulin. Treat a low blood sugar (less than 70 mg/dL) with  cup of clear juice (cranberry or apple), 4 glucose tablets, OR glucose gel. Recheck blood sugar in 15 minutes after treatment (to make sure it is greater than 70 mg/dL). If your blood sugar is not greater than 70 mg/dL on recheck, call [phone] for further instructions.  HUMALOG KWIKPEN 10/13: AM usual, PM none 10/14: none; CBG >220 take half dose   insulin glargine (LANTUS) 10/13: 4 units PM 10/14: none    The  Morning of Surgery Do not wear jewelry, make-up or nail polish. Do not wear lotions, powders, or perfumes, or deodorant  Do not bring valuables to the hospital. [facility] is not responsible for any belongings or valuables.  If you are a smoker, DO NOT Smoke 24 hours prior to surgery  If you wear a CPAP at night please bring your mask the morning of surgery   Remember that you must have someone to transport you home after your surgery, and remain with you for 24 hours if you are discharged the same day.  Please bring cases for contacts, glasses, hearing aids, dentures or bridgework because it cannot be worn into surgery.   Patients discharged the day of surgery will not be allowed to drive home.   Please shower the NIGHT BEFORE/MORNING OF SURGERY (use antibacterial soap like DIAL soap if possible). Wear comfortable clothes the morning of surgery. Oral Hygiene is also important to reduce your risk of infection.  Remember - BRUSH YOUR TEETH THE MORNING OF SURGERY WITH YOUR REGULAR TOOTHPASTE  Patient denies shortness of breath, fever, cough and chest pain.

## 2021-09-03 ENCOUNTER — Encounter (HOSPITAL_COMMUNITY)
Admission: AD | Disposition: A | Discharge status: Home Health | Payer: Self-pay | Source: Home / Self Care | Attending: Orthopedic Surgery

## 2021-09-03 ENCOUNTER — Encounter (HOSPITAL_COMMUNITY): Payer: Self-pay | Admitting: Orthopedic Surgery

## 2021-09-03 ENCOUNTER — Encounter: Payer: Self-pay | Admitting: Family

## 2021-09-03 ENCOUNTER — Ambulatory Visit (HOSPITAL_COMMUNITY): Payer: Medicare Other | Admitting: Anesthesiology

## 2021-09-03 ENCOUNTER — Ambulatory Visit: Payer: Medicaid - Out of State | Admitting: Physician Assistant

## 2021-09-03 ENCOUNTER — Inpatient Hospital Stay (HOSPITAL_COMMUNITY)
Admission: AD | Admit: 2021-09-03 | Discharge: 2021-09-06 | DRG: 573 | Disposition: A | Discharge status: Home Health | Payer: Medicare Other | Attending: Orthopedic Surgery | Admitting: Orthopedic Surgery

## 2021-09-03 ENCOUNTER — Other Ambulatory Visit: Payer: Self-pay

## 2021-09-03 DIAGNOSIS — H5461 Unqualified visual loss, right eye, normal vision left eye: Secondary | ICD-10-CM | POA: Diagnosis present

## 2021-09-03 DIAGNOSIS — Z992 Dependence on renal dialysis: Secondary | ICD-10-CM | POA: Diagnosis not present

## 2021-09-03 DIAGNOSIS — I120 Hypertensive chronic kidney disease with stage 5 chronic kidney disease or end stage renal disease: Secondary | ICD-10-CM | POA: Diagnosis present

## 2021-09-03 DIAGNOSIS — D631 Anemia in chronic kidney disease: Secondary | ICD-10-CM | POA: Diagnosis present

## 2021-09-03 DIAGNOSIS — E785 Hyperlipidemia, unspecified: Secondary | ICD-10-CM | POA: Diagnosis present

## 2021-09-03 DIAGNOSIS — L97911 Non-pressure chronic ulcer of unspecified part of right lower leg limited to breakdown of skin: Secondary | ICD-10-CM

## 2021-09-03 DIAGNOSIS — Z833 Family history of diabetes mellitus: Secondary | ICD-10-CM | POA: Diagnosis not present

## 2021-09-03 DIAGNOSIS — Z7982 Long term (current) use of aspirin: Secondary | ICD-10-CM | POA: Diagnosis not present

## 2021-09-03 DIAGNOSIS — Z888 Allergy status to other drugs, medicaments and biological substances status: Secondary | ICD-10-CM | POA: Diagnosis not present

## 2021-09-03 DIAGNOSIS — Z9049 Acquired absence of other specified parts of digestive tract: Secondary | ICD-10-CM | POA: Diagnosis not present

## 2021-09-03 DIAGNOSIS — N2581 Secondary hyperparathyroidism of renal origin: Secondary | ICD-10-CM | POA: Diagnosis present

## 2021-09-03 DIAGNOSIS — E039 Hypothyroidism, unspecified: Secondary | ICD-10-CM | POA: Diagnosis present

## 2021-09-03 DIAGNOSIS — Z79899 Other long term (current) drug therapy: Secondary | ICD-10-CM | POA: Diagnosis not present

## 2021-09-03 DIAGNOSIS — N186 End stage renal disease: Secondary | ICD-10-CM | POA: Diagnosis present

## 2021-09-03 DIAGNOSIS — Z794 Long term (current) use of insulin: Secondary | ICD-10-CM | POA: Diagnosis not present

## 2021-09-03 DIAGNOSIS — S81801A Unspecified open wound, right lower leg, initial encounter: Secondary | ICD-10-CM | POA: Diagnosis not present

## 2021-09-03 DIAGNOSIS — L97919 Non-pressure chronic ulcer of unspecified part of right lower leg with unspecified severity: Principal | ICD-10-CM | POA: Diagnosis present

## 2021-09-03 DIAGNOSIS — L97818 Non-pressure chronic ulcer of other part of right lower leg with other specified severity: Secondary | ICD-10-CM | POA: Diagnosis not present

## 2021-09-03 DIAGNOSIS — S81809A Unspecified open wound, unspecified lower leg, initial encounter: Secondary | ICD-10-CM | POA: Diagnosis present

## 2021-09-03 DIAGNOSIS — L97822 Non-pressure chronic ulcer of other part of left lower leg with fat layer exposed: Secondary | ICD-10-CM | POA: Diagnosis not present

## 2021-09-03 DIAGNOSIS — E1122 Type 2 diabetes mellitus with diabetic chronic kidney disease: Secondary | ICD-10-CM | POA: Diagnosis present

## 2021-09-03 DIAGNOSIS — L97929 Non-pressure chronic ulcer of unspecified part of left lower leg with unspecified severity: Secondary | ICD-10-CM | POA: Diagnosis present

## 2021-09-03 DIAGNOSIS — S81802A Unspecified open wound, left lower leg, initial encounter: Secondary | ICD-10-CM | POA: Diagnosis not present

## 2021-09-03 DIAGNOSIS — Z8249 Family history of ischemic heart disease and other diseases of the circulatory system: Secondary | ICD-10-CM | POA: Diagnosis not present

## 2021-09-03 DIAGNOSIS — Z79890 Hormone replacement therapy (postmenopausal): Secondary | ICD-10-CM

## 2021-09-03 DIAGNOSIS — L97923 Non-pressure chronic ulcer of unspecified part of left lower leg with necrosis of muscle: Secondary | ICD-10-CM

## 2021-09-03 DIAGNOSIS — Z20822 Contact with and (suspected) exposure to covid-19: Secondary | ICD-10-CM | POA: Diagnosis present

## 2021-09-03 DIAGNOSIS — M898X9 Other specified disorders of bone, unspecified site: Secondary | ICD-10-CM | POA: Diagnosis present

## 2021-09-03 DIAGNOSIS — L97812 Non-pressure chronic ulcer of other part of right lower leg with fat layer exposed: Secondary | ICD-10-CM

## 2021-09-03 DIAGNOSIS — H409 Unspecified glaucoma: Secondary | ICD-10-CM | POA: Diagnosis present

## 2021-09-03 HISTORY — PX: SKIN SPLIT GRAFT: SHX444

## 2021-09-03 LAB — GLUCOSE, CAPILLARY
Glucose-Capillary: 191 mg/dL — ABNORMAL HIGH (ref 70–99)
Glucose-Capillary: 109 mg/dL — ABNORMAL HIGH (ref 70–99)
Glucose-Capillary: 69 mg/dL — ABNORMAL LOW (ref 70–99)
Glucose-Capillary: 99 mg/dL (ref 70–99)
Glucose-Capillary: 146 mg/dL — ABNORMAL HIGH (ref 70–99)
Glucose-Capillary: 382 mg/dL — ABNORMAL HIGH (ref 70–99)

## 2021-09-03 LAB — POCT I-STAT, CHEM 8
BUN: 46 mg/dL — ABNORMAL HIGH (ref 6–20)
Calcium, Ion: 1.07 mmol/L — ABNORMAL LOW (ref 1.15–1.40)
Chloride: 101 mmol/L (ref 98–111)
Creatinine, Ser: 3.9 mg/dL — ABNORMAL HIGH (ref 0.44–1.00)
Glucose, Bld: 193 mg/dL — ABNORMAL HIGH (ref 70–99)
HCT: 33 % — ABNORMAL LOW (ref 36.0–46.0)
Hemoglobin: 11.2 g/dL — ABNORMAL LOW (ref 12.0–15.0)
Potassium: 4.6 mmol/L (ref 3.5–5.1)
Sodium: 136 mmol/L (ref 135–145)
TCO2: 28 mmol/L (ref 22–32)

## 2021-09-03 LAB — SURGICAL PCR SCREEN
MRSA, PCR: NEGATIVE
Staphylococcus aureus: NEGATIVE

## 2021-09-03 LAB — SARS CORONAVIRUS 2 BY RT PCR (HOSPITAL ORDER, PERFORMED IN ~~LOC~~ HOSPITAL LAB): SARS Coronavirus 2: NEGATIVE

## 2021-09-03 SURGERY — APPLICATION, GRAFT, SKIN, SPLIT-THICKNESS
Anesthesia: General | Laterality: Bilateral

## 2021-09-03 MED ORDER — OXYCODONE HCL 5 MG/5ML PO SOLN
5.0000 mg | Freq: Once | ORAL | Status: DC | PRN
Start: 2021-09-03 — End: 2021-09-03

## 2021-09-03 MED ORDER — BISACODYL 10 MG RE SUPP: 10.0000 mg | Freq: Every day | RECTAL | Status: DC | PRN

## 2021-09-03 MED ORDER — PROPOFOL 10 MG/ML IV BOLUS
INTRAVENOUS | Status: DC | PRN
  Administered 2021-09-03: 150 mg via INTRAVENOUS

## 2021-09-03 MED ORDER — POLYETHYLENE GLYCOL 3350 17 G PO PACK: 17.0000 g | PACK | Freq: Every day | ORAL | Status: DC | PRN

## 2021-09-03 MED ORDER — CHOLESTYRAMINE LIGHT 4 G PO PACK: 4.0000 g | PACK | Freq: Two times a day (BID) | ORAL | Status: DC

## 2021-09-03 MED ORDER — FENTANYL CITRATE (PF) 100 MCG/2ML IJ SOLN: 25.0000 ug | INTRAMUSCULAR | Status: DC | PRN

## 2021-09-03 MED ORDER — ACETAMINOPHEN 325 MG PO TABS: 325.0000 mg | ORAL_TABLET | Freq: Four times a day (QID) | ORAL | Status: DC | PRN

## 2021-09-03 MED ORDER — PROSOURCE PLUS PO LIQD
30.0000 mL | Freq: Two times a day (BID) | ORAL | Status: DC
  Filled 2021-09-03 (×4): qty 30

## 2021-09-03 MED ORDER — OXYCODONE HCL 5 MG PO TABS: 5.0000 mg | ORAL_TABLET | ORAL | Status: DC | PRN

## 2021-09-03 MED ORDER — ONDANSETRON HCL 4 MG/2ML IJ SOLN: 4.0000 mg | Freq: Once | INTRAMUSCULAR | Status: DC | PRN

## 2021-09-03 MED ORDER — SODIUM CHLORIDE 0.9 % IV SOLN: INTRAVENOUS | Status: DC

## 2021-09-03 MED ORDER — MIDAZOLAM HCL 2 MG/2ML IJ SOLN
INTRAMUSCULAR | Status: AC
  Filled 2021-09-03: qty 2

## 2021-09-03 MED ORDER — SEVELAMER CARBONATE 800 MG PO TABS
800.0000 mg | ORAL_TABLET | Freq: Three times a day (TID) | ORAL | Status: DC
  Administered 2021-09-03 – 2021-09-04 (×2): 800 mg via ORAL
  Filled 2021-09-03 (×4): qty 1

## 2021-09-03 MED ORDER — METHOCARBAMOL 1000 MG/10ML IJ SOLN
500.0000 mg | Freq: Four times a day (QID) | INTRAVENOUS | Status: DC | PRN
  Filled 2021-09-03: qty 5

## 2021-09-03 MED ORDER — DEXAMETHASONE SODIUM PHOSPHATE 10 MG/ML IJ SOLN
INTRAMUSCULAR | Status: DC | PRN
  Administered 2021-09-03: 10 mg via INTRAVENOUS

## 2021-09-03 MED ORDER — INSULIN ASPART 100 UNIT/ML IJ SOLN
0.0000 [IU] | Freq: Three times a day (TID) | INTRAMUSCULAR | Status: DC
  Administered 2021-09-04: 2 [IU] via SUBCUTANEOUS
  Administered 2021-09-04 (×2): 4 [IU] via SUBCUTANEOUS
  Administered 2021-09-05: 1 [IU] via SUBCUTANEOUS
  Administered 2021-09-05: 3 [IU] via SUBCUTANEOUS
  Administered 2021-09-05: 4 [IU] via SUBCUTANEOUS
  Administered 2021-09-06: 3 [IU] via SUBCUTANEOUS
  Administered 2021-09-06: 1 [IU] via SUBCUTANEOUS

## 2021-09-03 MED ORDER — DEXTROSE 50 % IV SOLN
INTRAVENOUS | Status: AC
  Filled 2021-09-03: qty 50

## 2021-09-03 MED ORDER — PHENYLEPHRINE 40 MCG/ML (10ML) SYRINGE FOR IV PUSH (FOR BLOOD PRESSURE SUPPORT)
PREFILLED_SYRINGE | INTRAVENOUS | Status: DC | PRN
  Administered 2021-09-03: 120 ug via INTRAVENOUS
  Administered 2021-09-03: 160 ug via INTRAVENOUS
  Administered 2021-09-03: 120 ug via INTRAVENOUS
  Administered 2021-09-03: 160 ug via INTRAVENOUS

## 2021-09-03 MED ORDER — HYDROMORPHONE HCL 1 MG/ML IJ SOLN: 0.5000 mg | INTRAMUSCULAR | Status: DC | PRN

## 2021-09-03 MED ORDER — ONDANSETRON HCL 4 MG PO TABS: 4.0000 mg | ORAL_TABLET | Freq: Four times a day (QID) | ORAL | Status: DC | PRN

## 2021-09-03 MED ORDER — CEFAZOLIN SODIUM-DEXTROSE 2-4 GM/100ML-% IV SOLN
2.0000 g | INTRAVENOUS | Status: AC
  Administered 2021-09-03: 2 g via INTRAVENOUS

## 2021-09-03 MED ORDER — VITAMIN D (ERGOCALCIFEROL) 1.25 MG (50000 UNIT) PO CAPS: 50000.0000 [IU] | ORAL_CAPSULE | ORAL | Status: DC

## 2021-09-03 MED ORDER — CEFAZOLIN SODIUM-DEXTROSE 2-4 GM/100ML-% IV SOLN
INTRAVENOUS | Status: AC
  Filled 2021-09-03: qty 100

## 2021-09-03 MED ORDER — DEXTROSE 50 % IV SOLN
12.5000 g | INTRAVENOUS | Status: AC
  Administered 2021-09-03: 12.5 g via INTRAVENOUS

## 2021-09-03 MED ORDER — TRANEXAMIC ACID 1000 MG/10ML IV SOLN
2000.0000 mg | INTRAVENOUS | Status: DC
  Filled 2021-09-03: qty 20

## 2021-09-03 MED ORDER — TRANEXAMIC ACID-NACL 1000-0.7 MG/100ML-% IV SOLN
1000.0000 mg | Freq: Once | INTRAVENOUS | Status: AC
  Administered 2021-09-03: 1000 mg via INTRAVENOUS
  Filled 2021-09-03 (×2): qty 100

## 2021-09-03 MED ORDER — METOCLOPRAMIDE HCL 5 MG/ML IJ SOLN: 5.0000 mg | Freq: Three times a day (TID) | INTRAMUSCULAR | Status: DC | PRN

## 2021-09-03 MED ORDER — CHLORHEXIDINE GLUCONATE 0.12 % MT SOLN
OROMUCOSAL | Status: AC
  Administered 2021-09-03: 15 mL via OROMUCOSAL
  Filled 2021-09-03: qty 15

## 2021-09-03 MED ORDER — TRANEXAMIC ACID-NACL 1000-0.7 MG/100ML-% IV SOLN
1000.0000 mg | INTRAVENOUS | Status: AC
  Administered 2021-09-03: 1000 mg via INTRAVENOUS

## 2021-09-03 MED ORDER — ORAL CARE MOUTH RINSE: 15.0000 mL | Freq: Once | OROMUCOSAL | Status: AC

## 2021-09-03 MED ORDER — METHOCARBAMOL 500 MG PO TABS
500.0000 mg | ORAL_TABLET | Freq: Four times a day (QID) | ORAL | Status: DC | PRN
  Administered 2021-09-03: 500 mg via ORAL
  Filled 2021-09-03: qty 1

## 2021-09-03 MED ORDER — LEVOTHYROXINE SODIUM 50 MCG PO TABS
50.0000 ug | ORAL_TABLET | Freq: Every day | ORAL | Status: DC
  Administered 2021-09-04 – 2021-09-06 (×3): 50 ug via ORAL
  Filled 2021-09-03 (×3): qty 1

## 2021-09-03 MED ORDER — LIDOCAINE 2% (20 MG/ML) 5 ML SYRINGE
INTRAMUSCULAR | Status: AC
  Filled 2021-09-03: qty 15

## 2021-09-03 MED ORDER — LIDOCAINE 2% (20 MG/ML) 5 ML SYRINGE
INTRAMUSCULAR | Status: DC | PRN
  Administered 2021-09-03: 60 mg via INTRAVENOUS

## 2021-09-03 MED ORDER — PROPOFOL 10 MG/ML IV BOLUS
INTRAVENOUS | Status: AC
  Filled 2021-09-03: qty 20

## 2021-09-03 MED ORDER — CHLORHEXIDINE GLUCONATE 0.12 % MT SOLN: 15.0000 mL | Freq: Once | OROMUCOSAL | Status: AC

## 2021-09-03 MED ORDER — ONDANSETRON HCL 4 MG/2ML IJ SOLN
INTRAMUSCULAR | Status: DC | PRN
  Administered 2021-09-03: 4 mg via INTRAVENOUS

## 2021-09-03 MED ORDER — TRANEXAMIC ACID-NACL 1000-0.7 MG/100ML-% IV SOLN
INTRAVENOUS | Status: AC
  Filled 2021-09-03: qty 100

## 2021-09-03 MED ORDER — LACTATED RINGERS IV SOLN: INTRAVENOUS | Status: DC | PRN

## 2021-09-03 MED ORDER — METOPROLOL SUCCINATE ER 25 MG PO TB24
50.0000 mg | ORAL_TABLET | Freq: Every day | ORAL | Status: DC
  Filled 2021-09-03 (×2): qty 2

## 2021-09-03 MED ORDER — EPHEDRINE SULFATE-NACL 50-0.9 MG/10ML-% IV SOSY
PREFILLED_SYRINGE | INTRAVENOUS | Status: DC | PRN
  Administered 2021-09-03: 15 mg via INTRAVENOUS

## 2021-09-03 MED ORDER — OXYCODONE HCL 5 MG PO TABS: 10.0000 mg | ORAL_TABLET | ORAL | Status: DC | PRN

## 2021-09-03 MED ORDER — PANTOPRAZOLE SODIUM 40 MG PO TBEC
40.0000 mg | DELAYED_RELEASE_TABLET | Freq: Every day | ORAL | Status: DC
  Administered 2021-09-03 – 2021-09-06 (×4): 40 mg via ORAL
  Filled 2021-09-03 (×3): qty 1

## 2021-09-03 MED ORDER — ONDANSETRON HCL 4 MG/2ML IJ SOLN: 4.0000 mg | Freq: Four times a day (QID) | INTRAMUSCULAR | Status: DC | PRN

## 2021-09-03 MED ORDER — FENTANYL CITRATE (PF) 250 MCG/5ML IJ SOLN
INTRAMUSCULAR | Status: AC
  Filled 2021-09-03: qty 5

## 2021-09-03 MED ORDER — FENTANYL CITRATE (PF) 100 MCG/2ML IJ SOLN
INTRAMUSCULAR | Status: DC | PRN
  Administered 2021-09-03: 50 ug via INTRAVENOUS

## 2021-09-03 MED ORDER — INSULIN ASPART 100 UNIT/ML IJ SOLN
2.0000 [IU] | Freq: Three times a day (TID) | INTRAMUSCULAR | Status: DC
  Administered 2021-09-03 – 2021-09-06 (×9): 2 [IU] via SUBCUTANEOUS

## 2021-09-03 MED ORDER — OXYCODONE HCL 5 MG PO TABS: 5.0000 mg | ORAL_TABLET | Freq: Once | ORAL | Status: DC | PRN

## 2021-09-03 MED ORDER — ATORVASTATIN CALCIUM 10 MG PO TABS
10.0000 mg | ORAL_TABLET | Freq: Every day | ORAL | Status: DC
  Administered 2021-09-03 – 2021-09-05 (×3): 10 mg via ORAL
  Filled 2021-09-03 (×3): qty 1

## 2021-09-03 MED ORDER — DOCUSATE SODIUM 100 MG PO CAPS
100.0000 mg | ORAL_CAPSULE | Freq: Two times a day (BID) | ORAL | Status: DC
  Administered 2021-09-04 – 2021-09-06 (×4): 100 mg via ORAL
  Filled 2021-09-03 (×6): qty 1

## 2021-09-03 MED ORDER — METOCLOPRAMIDE HCL 5 MG PO TABS: 5.0000 mg | ORAL_TABLET | Freq: Three times a day (TID) | ORAL | Status: DC | PRN

## 2021-09-03 MED ORDER — TORSEMIDE 20 MG PO TABS
150.0000 mg | ORAL_TABLET | Freq: Every day | ORAL | Status: DC
  Administered 2021-09-03 – 2021-09-06 (×4): 150 mg via ORAL
  Filled 2021-09-03 (×4): qty 1

## 2021-09-03 MED ORDER — INSULIN GLARGINE-YFGN 100 UNIT/ML ~~LOC~~ SOLN
5.0000 [IU] | Freq: Every day | SUBCUTANEOUS | Status: DC
  Administered 2021-09-03 – 2021-09-05 (×3): 5 [IU] via SUBCUTANEOUS
  Filled 2021-09-03 (×4): qty 0.05

## 2021-09-03 MED ORDER — MIDAZOLAM HCL 5 MG/5ML IJ SOLN
INTRAMUSCULAR | Status: DC | PRN
  Administered 2021-09-03: 2 mg via INTRAVENOUS

## 2021-09-03 SURGICAL SUPPLY — 46 items
ALLOGRAFT SKIN MESHD 387 SQ CM (Graft) ×1 IMPLANT
BAG COUNTER SPONGE SURGICOUNT (BAG) ×2 IMPLANT
BNDG CMPR 9X4 STRL LF SNTH (GAUZE/BANDAGES/DRESSINGS) ×1
BNDG COHESIVE 6X5 TAN STRL LF (GAUZE/BANDAGES/DRESSINGS) IMPLANT
BNDG ESMARK 4X9 LF (GAUZE/BANDAGES/DRESSINGS) ×2 IMPLANT
BNDG GAUZE ELAST 4 BULKY (GAUZE/BANDAGES/DRESSINGS) IMPLANT
COVER SURGICAL LIGHT HANDLE (MISCELLANEOUS) ×4 IMPLANT
CUFF TOURN SGL QUICK 18X4 (TOURNIQUET CUFF) IMPLANT
CUFF TOURN SGL QUICK 24 (TOURNIQUET CUFF)
CUFF TRNQT CYL 24X4X16.5-23 (TOURNIQUET CUFF) IMPLANT
DERMACARRIERS GRAFT 1 TO 1.5 (DISPOSABLE)
DRAPE U-SHAPE 47X51 STRL (DRAPES) ×2 IMPLANT
DRESSING VERAFLO CLEANS CC MED (GAUZE/BANDAGES/DRESSINGS) ×2 IMPLANT
DRSG ADAPTIC 3X8 NADH LF (GAUZE/BANDAGES/DRESSINGS) IMPLANT
DRSG MEPITEL 4X7.2 (GAUZE/BANDAGES/DRESSINGS) ×2 IMPLANT
DRSG VERAFLO CLEANSE CC MED (GAUZE/BANDAGES/DRESSINGS) ×4
DURAPREP 26ML APPLICATOR (WOUND CARE) ×2 IMPLANT
ELECT REM PT RETURN 9FT ADLT (ELECTROSURGICAL) ×2
ELECTRODE REM PT RTRN 9FT ADLT (ELECTROSURGICAL) ×1 IMPLANT
GAUZE SPONGE 4X4 12PLY STRL (GAUZE/BANDAGES/DRESSINGS) IMPLANT
GLOVE SURG ORTHO LTX SZ9 (GLOVE) ×2 IMPLANT
GLOVE SURG UNDER POLY LF SZ9 (GLOVE) ×2 IMPLANT
GOWN STRL REUS W/ TWL XL LVL3 (GOWN DISPOSABLE) ×2 IMPLANT
GOWN STRL REUS W/TWL XL LVL3 (GOWN DISPOSABLE) ×4
GRAFT DERMACARRIERS 1 TO 1.5 (DISPOSABLE) IMPLANT
KIT BASIN OR (CUSTOM PROCEDURE TRAY) ×2 IMPLANT
KIT DRSG PREVENA PLUS 7DAY 125 (MISCELLANEOUS) ×4 IMPLANT
KIT TURNOVER KIT B (KITS) ×2 IMPLANT
MANIFOLD NEPTUNE II (INSTRUMENTS) ×2 IMPLANT
NEEDLE HYPO 25GX1X1/2 BEV (NEEDLE) IMPLANT
NS IRRIG 1000ML POUR BTL (IV SOLUTION) ×2 IMPLANT
PACK ORTHO EXTREMITY (CUSTOM PROCEDURE TRAY) ×2 IMPLANT
PAD ARMBOARD 7.5X6 YLW CONV (MISCELLANEOUS) ×4 IMPLANT
PAD CAST 4YDX4 CTTN HI CHSV (CAST SUPPLIES) IMPLANT
PAD NEG PRESSURE SENSATRAC (MISCELLANEOUS) ×4 IMPLANT
PADDING CAST COTTON 4X4 STRL (CAST SUPPLIES)
SKIN MESHED 387 SQ (Graft) ×1 IMPLANT
SKIN MESHED 387 SQ CM (Graft) ×2 IMPLANT
SUCTION FRAZIER HANDLE 10FR (MISCELLANEOUS)
SUCTION TUBE FRAZIER 10FR DISP (MISCELLANEOUS) IMPLANT
SUT ETHILON 4 0 PS 2 18 (SUTURE) IMPLANT
SYR CONTROL 10ML LL (SYRINGE) IMPLANT
TOWEL GREEN STERILE (TOWEL DISPOSABLE) ×2 IMPLANT
TOWEL GREEN STERILE FF (TOWEL DISPOSABLE) ×2 IMPLANT
TUBE CONNECTING 12X1/4 (SUCTIONS) IMPLANT
WATER STERILE IRR 1000ML POUR (IV SOLUTION) ×2 IMPLANT

## 2021-09-03 NOTE — H&P (Signed)
Amy Moses is an 50 y.o. female.   Chief Complaint: Bilateral calf wounds secondary to calciphylaxis. HPI: Patient is a 50 year old woman with diabetes end-stage renal disease on dialysis who developed massive ulcerations on both legs secondary to calciphylaxis.  Patient underwent conservative treatment with wound care and infusions during surgery.  Despite conservative care patient still has large ulcers however the wound bed has improved to the point where skin graft should facilitate healing.  Past Medical History:  Diagnosis Date   Anemia    Blindness of right eye with low vision in contralateral eye    s/p victrectomy   Diabetes mellitus, type II ([place])    Dyslipidemia    Glaucoma    Hypertension    Hypothyroidism (acquired)    Kidney disease    Stage 5   Pneumonia     Past Surgical History:  Procedure Laterality Date   ABDOMINAL AORTOGRAM W/LOWER EXTREMITY Bilateral 12/18/2020   Procedure: ABDOMINAL AORTOGRAM W/LOWER EXTREMITY;  Surgeon: Elam Dutch, MD;  Location: [facility];  Service: Cardiovascular;  Laterality: Bilateral;   ANKLE FRACTURE SURGERY Right    AV FISTULA PLACEMENT Left 08/18/2020   Procedure: LEFT ARM BRACHIOCEPHALIC ARTERIOVENOUS (AV) FISTULA CREATION;  Surgeon: Elam Dutch, MD;  Location: [facility];  Service: Vascular;  Laterality: Left;   BIOPSY  04/24/2021   Procedure: BIOPSY;  Surgeon: Eloise Harman, DO;  [facility];  Service: Endoscopy;;   CESAREAN SECTION     CHOLECYSTECTOMY     COLONOSCOPY  04/24/2021   Surgeon: Eloise Harman, DO;  nonbleeding internal hemorrhoids, 1 large (25 mm) pedunculated transverse colon polyp (prolapse type polyp) with adherent clot and stigmata of recent bleed.   COLONOSCOPY WITH PROPOFOL N/A 05/14/2021   Procedure: COLONOSCOPY WITH PROPOFOL;  Surgeon: Daneil Dolin, MD;  [facility];  Service: Endoscopy;  Laterality: N/A;   ESOPHAGOGASTRODUODENOSCOPY (EGD) WITH PROPOFOL N/A  04/24/2021   Surgeon: Eloise Harman, DO;  duodenal erosions and gastritis biopsied (pathology with peptic duodenitis, reactive gastropathy with erosions/chronic inflammation, negative for H. pylori)   EYE SURGERY     Vatrectomy   HEMOSTASIS CLIP PLACEMENT  05/14/2021   Procedure: HEMOSTASIS CLIP PLACEMENT;  Surgeon: Daneil Dolin, MD;  [facility];  Service: Endoscopy;;   IR PERC TUN PERIT CATH WO PORT S&I /IMAG  09/15/2020   IR REMOVAL TUN CV CATH W/O FL  02/19/2021   IR US GUIDE VASC ACCESS RIGHT  09/15/2020   POLYPECTOMY  04/24/2021   Procedure: POLYPECTOMY;  Surgeon: Eloise Harman, DO;  [facility];  Service: Endoscopy;;   POLYPECTOMY  05/14/2021   Procedure: POLYPECTOMY;  Surgeon: Daneil Dolin, MD;  [facility];  Service: Endoscopy;;   TOE SURGERY      Family History  Problem Relation Age of Onset   Heart disease Mother    Diabetes Mother    Kidney disease Mother    Diabetes Father    Heart disease Father    Diabetes Brother    Colon cancer Neg Hx    Social History:  reports that she has never smoked. She has never used smokeless tobacco. She reports that she does not drink alcohol and does not use drugs.  Allergies:  Allergies  Allergen Reactions   Ace Inhibitors Cough    Medications Prior to Admission  Medication Sig Dispense Refill   acetaminophen (TYLENOL) 500 MG tablet Take 1,000 mg by mouth every 6 (  six) hours as needed for moderate pain.     aspirin EC 81 MG tablet Take 1 tablet (81 mg total) by mouth daily with breakfast. 30 tablet 2   atorvastatin (LIPITOR) 10 MG tablet Take 1 tablet (10 mg total) by mouth daily. (Patient taking differently: Take 10 mg by mouth at bedtime.) 90 tablet 5   HUMALOG KWIKPEN 100 UNIT/ML KwikPen Inject 2 Units into the skin 3 (three) times daily with meals. If eats 50% or more of meal. (Patient taking differently: Inject 5-15 Units into the skin 3 (three) times daily with meals. Sliding  Scale If eats 50% or more of meal.) 15 mL 11   insulin glargine (LANTUS) 100 UNIT/ML injection Inject 0.08 mLs (8 Units total) into the skin at bedtime. 10 mL 11   levothyroxine (SYNTHROID) 50 MCG tablet Take 1 tablet (50 mcg total) by mouth daily before breakfast. 30 tablet 5   pantoprazole (PROTONIX) 40 MG tablet Take 1 tablet (40 mg total) by mouth 2 (two) times daily. (Patient taking differently: Take 40 mg by mouth daily.) 60 tablet 5   sevelamer carbonate (RENVELA) 800 MG tablet Take 1 tablet (800 mg total) by mouth 3 (three) times daily with meals. 90 tablet 3   torsemide (DEMADEX) 100 MG tablet Take 150 mg by mouth daily.      Vitamin D, Ergocalciferol, (DRISDOL) 1.25 MG (50000 UNIT) CAPS capsule Take 50,000 Units by mouth every 14 (fourteen) days.     camphor-menthol (SARNA) lotion Apply 1 application topically as needed for itching. (Patient not taking: Reported on 09/01/2021) 222 mL 2   cholestyramine light (PREVALITE) 4 g packet Take 1 packet (4 g total) by mouth 2 (two) times daily. (Patient not taking: No sig reported) 60 packet 5   metoprolol succinate (TOPROL-XL) 50 MG 24 hr tablet Take 1 tablet (50 mg total) by mouth at bedtime. Take with or immediately following a meal. 90 tablet 5   polyethylene glycol (MIRALAX) 17 g packet Take 17 g by mouth daily as needed for mild constipation. (Patient not taking: Reported on 09/01/2021) 28 each 1   Zinc Oxide (TRIPLE PASTE) 12.8 % ointment Apply topically 3 (three) times daily. To buttocks (Patient not taking: Reported on 09/01/2021) 56.7 g 0    No results found for this or any previous visit (from the past 48 hour(s)). No results found.  Review of Systems  All other systems reviewed and are negative.  Height '5\' 6"'$  (1.676 m), weight 86.2 kg, last menstrual period 08/22/2015. Physical Exam  Patient is alert oriented no adenopathy well-dressed normal affect normal respiratory effort.  She has significant improvement in the calf ulcers  bilaterally secondary to calciphylaxis.  There is approximately 90% of healthy granulation tissue with a small amount of fibrinous exudative tissue there is no cellulitis no odor no signs of infection there is clear drainage. Assessment/Plan Bilateral leg wounds secondary to calciphylaxis.  Plan: We will plan for repeat debridement and application of split-thickness skin graft and application of wound vacs.  Newt Minion, MD 09/03/2021, 7:31 AM

## 2021-09-03 NOTE — Transfer of Care (Signed)
Immediate Anesthesia Transfer of Care Note  Patient: [name], clinical  Procedure(s) Performed: SKIN GRAFT BILATERAL LEGS (Bilateral)  Patient Location: PACU  Anesthesia Type:General  Level of Consciousness: drowsy and patient cooperative  Airway & Oxygen Therapy: Patient Spontanous Breathing  Post-op Assessment: Report given to RN and Post -op Vital signs reviewed and stable  Post vital signs: Reviewed and stable  Last Vitals:  Vitals Value Taken Time  BP 99/57 09/03/21 1239  Temp    Pulse 74 09/03/21 1241  Resp 18 09/03/21 1241  SpO2 96 % 09/03/21 1241  Vitals shown include unvalidated device data.  Last Pain:  Vitals:   09/03/21 0808  TempSrc:   PainSc: 0-No pain         Complications: No notable events documented.

## 2021-09-03 NOTE — Anesthesia Procedure Notes (Signed)
Procedure Name: LMA Insertion Date/Time: 09/03/2021 11:45 AM Performed by: Georgia Duff, CRNA Pre-anesthesia Checklist: Patient identified, Emergency Drugs available, Suction available and Patient being monitored Patient Re-evaluated:Patient Re-evaluated prior to induction Oxygen Delivery Method: Circle System Utilized Preoxygenation: Pre-oxygenation with 100% oxygen Induction Type: IV induction Ventilation: Mask ventilation without difficulty LMA: LMA inserted LMA Size: 4.0 Number of attempts: 1 Airway Equipment and Method: Bite block Placement Confirmation: positive ETCO2 Tube secured with: Tape Dental Injury: Teeth and Oropharynx as per pre-operative assessment

## 2021-09-03 NOTE — Anesthesia Postprocedure Evaluation (Signed)
Anesthesia Post Note  Patient: [name], clinical  Procedure(s) Performed: SKIN GRAFT BILATERAL LEGS (Bilateral)     Patient location during evaluation: PACU Anesthesia Type: General Level of consciousness: awake and alert Pain management: pain level controlled Vital Signs Assessment: post-procedure vital signs reviewed and stable Respiratory status: spontaneous breathing, nonlabored ventilation and respiratory function stable Cardiovascular status: blood pressure returned to baseline and stable Postop Assessment: no apparent nausea or vomiting Anesthetic complications: no   No notable events documented.  Last Vitals:  Vitals:   09/03/21 1440 09/03/21 1455  BP: 127/74   Pulse: 73   Resp: 14   Temp:  (!) 36.1 C  SpO2: 100%     Last Pain:  Vitals:   09/03/21 1455  TempSrc:   PainSc: 0-No pain                 Lidia Collum

## 2021-09-03 NOTE — Op Note (Signed)
09/03/2021  12:48 PM  PATIENT:  Amy Moses    PRE-OPERATIVE DIAGNOSIS:  ulcers bilateral legs  POST-OPERATIVE DIAGNOSIS:  Same  PROCEDURE:  SKIN GRAFT BILATERAL LEGS 387 cm split-thickness skin graft allograft. Application of Prevena cleanse choice blue wound VAC dressing sponge x2  SURGEON:  Newt Minion, MD  PHYSICIAN ASSISTANT:None ANESTHESIA:   General  PREOPERATIVE INDICATIONS:  Amy Moses is a  50 y.o. female with a diagnosis of ulcers bilateral legs who failed conservative measures and elected for surgical management.    The risks benefits and alternatives were discussed with the patient preoperatively including but not limited to the risks of infection, bleeding, nerve injury, cardiopulmonary complications, the need for revision surgery, among others, and the patient was willing to proceed.  OPERATIVE IMPLANTS: Allograft split-thickness skin graft 387 cm and Prevena cleanse choice wound VAC sponges x2  '@ENCIMAGES'$ @  OPERATIVE FINDINGS: After debridement the wound bed had healthy granulation tissue.  OPERATIVE PROCEDURE: Patient was brought the operating room and underwent a general anesthetic.  After adequate levels anesthesia were obtained both lower extremities were prepped using DuraPrep draped into a sterile field a timeout was called.  Attention was first focused on the left lower extremity.  A 21 blade knife was used to debride the skin and soft tissue back to healthy bleeding viable muscle.  This was debrided with a rondure and a knife.  The allograft split-thickness skin graft was applied covered with a cleanse choice wound VAC sponge and connected to suction.  Attention was then focused on the right lower extremity.  This was also debrided with a rondure and a 21 blade knife back to bleeding viable granulation tissue.  The wound was cleansed and covered with the split-thickness skin graft both were secured with staples covered with the cleanse choice wound  VAC blue sponge covered with derma tack and Ioban.  These were both hooked to suction.  Both wounds were then covered with Covan from the metatarsal heads to the tibial tubercle both legs had a good suction fit.  The total surface area of both wounds was 387 cm.  Patient was extubated taken the PACU in stable condition.  Debridement type: Excisional Debridement  Side: bilaterally  Body Location: both legs   Tools used for debridement: scalpel and rongeur  Pre-debridement Wound size (cm):   Length: 10        Width: 10     Depth: 1   Post-debridement Wound size (cm):   Length: 15        Width: 15     Depth: 1   Debridement depth beyond dead/damaged tissue down to healthy viable tissue: yes  Tissue layer involved: skin, subcutaneous tissue, muscle / fascia  Nature of tissue removed: Devitalized Tissue and Non-viable tissue  Irrigation volume: 1 liter     Irrigation fluid type: Normal Saline     DISCHARGE PLANNING:  Antibiotic duration: Antibiotics for 24 hours  Weightbearing: Weightbearing as tolerated both lower extremities  Pain medication: Opioid pathway  Dressing care/ Wound VAC: Continue wound with VAC for 1 week  Ambulatory devices: Walker  Discharge to: Anticipate discharge to home after dialysis tomorrow.  Follow-up: In the office 1 week post operative.

## 2021-09-03 NOTE — Anesthesia Preprocedure Evaluation (Addendum)
Anesthesia Evaluation  Patient identified by MRN, date of birth, ID band Patient awake    Reviewed: Allergy & Precautions, NPO status , Patient's Chart, lab work & pertinent test results  History of Anesthesia Complications Negative for: history of anesthetic complications  Airway Mallampati: II  TM Distance: >3 FB Neck ROM: Full    Dental  (+) Teeth Intact, Dental Advisory Given   Pulmonary neg pulmonary ROS,    Pulmonary exam normal        Cardiovascular hypertension, Pt. on home beta blockers negative cardio ROS Normal cardiovascular exam     Neuro/Psych negative neurological ROS     GI/Hepatic negative GI ROS, Neg liver ROS,   Endo/Other  diabetes, Type 2, Insulin DependentHypothyroidism   Renal/GU ESRF and DialysisRenal disease (TTS dialysis, last session yesterday, no missed sessions last 2 weeks)  negative genitourinary   Musculoskeletal negative musculoskeletal ROS (+)   Abdominal   Peds  Hematology  (+) anemia ,   Anesthesia Other Findings   Reproductive/Obstetrics                            Anesthesia Physical Anesthesia Plan  ASA: 3  Anesthesia Plan: General   Post-op Pain Management:    Induction: Intravenous  PONV Risk Score and Plan: 3 and Ondansetron, Dexamethasone, Midazolam and Treatment may vary due to age or medical condition  Airway Management Planned: LMA  Additional Equipment: None  Intra-op Plan:   Post-operative Plan: Extubation in OR  Informed Consent: I have reviewed the patients History and Physical, chart, labs and discussed the procedure including the risks, benefits and alternatives for the proposed anesthesia with the patient or authorized representative who has indicated his/her understanding and acceptance.     Dental advisory given  Plan Discussed with:   Anesthesia Plan Comments:        Anesthesia Quick Evaluation

## 2021-09-03 NOTE — Progress Notes (Signed)
Office Visit Note   Patient: Amy Moses           Date of Birth: [birth year]           MRN: [id number] Visit Date: 08/16/2021              Requested by: Wynelle Cleveland, MD No address on file PCP: Wynelle Cleveland, MD  Chief Complaint  Patient presents with   Right Leg - Open Wound    Follow up consult in the hospitals 08/08/21   Left Leg - Open Wound      HPI: Patient is a 50 year old woman who was seen in follow-up for calciphylaxis both lower extremities.  Patient was using the medical compression stockings in the hospital. Has been in Dynaflex compression wraps. We are changing these twice weekly.   Assessment & Plan: Visit Diagnoses:  1. Calciphylaxis of right lower extremity with nonhealing ulcer, limited to breakdown of skin ([place])   2. Calciphylaxis of left lower extremity with nonhealing ulcer with necrosis of muscle ([place])     Plan: We will apply Dynaflex wraps to both legs. These are stable.  Follow-Up Instructions: Return in about 1 week (around 08/23/2021).   Ortho Exam  Patient is alert, oriented, no adenopathy, well-dressed, normal affect, normal respiratory effort. Examination the wound beds on both legs have approximately 90% healthy granulation tissue there is been excellent debridement of the fibrinous exudative tissue. The right leg has full bleeding granulation with no depth. The left leg ulcer is much larger. There is central depth with necrotic muscle. 90% of wound bleeding granulation. There is no cellulitis no odor no drainage no exposed bone or tendon.  The calf ulcers from calciphylaxis on both calfs. Imaging: No results found. No images are attached to the encounter.  Labs: Lab Results  Component Value Date   HGBA1C 9.5 (H) 08/07/2021   HGBA1C 7.3 (H) 03/30/2021   HGBA1C 7.4 (H) 06/10/2020   ESRSEDRATE 40 (H) 08/07/2021   CRP 11.1 (H) 08/07/2021   REPTSTATUS 08/12/2021 FINAL 08/07/2021   GRAMSTAIN  07/08/2021    RARE WBC PRESENT,BOTH  PMN AND MONONUCLEAR RARE GRAM POSITIVE RODS RARE GRAM POSITIVE COCCI IN PAIRS    CULT  08/07/2021    NO GROWTH 5 DAYS Performed at [facility], [place] [address] [place],  [address]    LABORGA CITROBACTER KOSERI 05/21/2021   LABORGA PROTEUS MIRABILIS 05/21/2021     Lab Results  Component Value Date   ALBUMIN 2.4 (L) 08/12/2021   ALBUMIN 2.1 (L) 08/10/2021   ALBUMIN 2.4 (L) 08/06/2021   PREALBUMIN 13.7 (L) 08/07/2021    Lab Results  Component Value Date   MG 2.1 05/14/2021   MG 2.8 (H) 05/11/2021   MG 2.3 04/23/2021   No results found for: VD25OH  Lab Results  Component Value Date   PREALBUMIN 13.7 (L) 08/07/2021   CBC EXTENDED Latest Ref Rng & Units 08/12/2021 08/10/2021 08/07/2021  WBC 4.0 - 10.5 K/uL 8.1 9.1 10.6(H)  RBC 3.87 - 5.11 MIL/uL 4.21 4.41 4.47  HGB 12.0 - 15.0 g/dL 10.1(L) 10.7(L) 10.6(L)  HCT 36.0 - 46.0 % 33.4(L) 35.5(L) 35.9(L)  PLT 150 - 400 K/uL 294 320 330  NEUTROABS 1.7 - 7.7 K/uL - - -  LYMPHSABS 0.7 - 4.0 K/uL - - -     There is no height or weight on file to calculate BMI.  Orders:  No orders of the defined types were placed in this encounter.  No orders  of the defined types were placed in this encounter.    Procedures: No procedures performed  Clinical Data: No additional findings.  ROS:  All other systems negative, except as noted in the HPI. Review of Systems  Cardiovascular:  Positive for leg swelling.  Skin:  Positive for wound. Negative for color change.   Objective: Vital Signs: LMP 08/22/2015 (Approximate)   Specialty Comments:  No specialty comments available.  PMFS History: Patient Active Problem List   Diagnosis Date Noted   Wound infection    Non-pressure chronic ulcer of right calf limited to breakdown of skin ([place])    Calciphylaxis of right lower extremity with nonhealing ulcer, limited to breakdown of skin ([place])    Chronic ulcer of left thigh ([place]) 08/07/2021   Calciphylaxis of left lower  extremity with nonhealing ulcer with necrosis of muscle ([place]) 08/07/2021   Lower GI bleed 05/12/2021   Blindness 05/10/2021   Shingles 05/10/2021   Acute GI bleeding 04/24/2021   Acute blood loss anemia 04/23/2021   GI bleed 04/22/2021   Fever 03/31/2021   Acute on chronic heart failure with preserved ejection fraction (HFpEF) ([place]) 03/30/2021   Pressure injury of skin 03/30/2021   End stage renal disease ([place]) 11/16/2020   Calciphylaxis 11/06/2020   Non-healing open wound of heel 11/03/2020   Diabetic foot infection ([place]) 11/01/2020   Decubitus ulcer, heel 11/01/2020   Closed nondisplaced fracture of left patella [id number]   Metabolic acidosis [id number]   Acute on chronic renal failure ([place]) 06/10/2020   Symptomatic anemia 06/10/2020   Acute pericardial effusion 06/10/2020   Other acute nonsuppurative otitis media, unspecified ear 11/29/2019   Chronic kidney disease, stage 4 (severe) ([place]) 03/05/2019   Skin ulcer, limited to breakdown of skin ([place]) 01/28/2019   Vitamin D deficiency 01/28/2019   Uncontrolled type 2 diabetes mellitus with chronic kidney disease, with long-term current use of insulin ([place]) 09/21/2015   Hyperlipidemia 09/21/2015   Essential hypertension, benign 09/21/2015   Primary hypothyroidism 09/21/2015   Iris bomb 07/31/2012   Secondary angle-closure glaucoma 07/31/2012   Past Medical History:  Diagnosis Date   Anemia    Blindness of right eye with low vision in contralateral eye    s/p victrectomy   Diabetes mellitus, type II ([place])    Dyslipidemia    Glaucoma    Hypertension    Hypothyroidism (acquired)    Kidney disease    Stage 5   Pneumonia     Family History  Problem Relation Age of Onset   Heart disease Mother    Diabetes Mother    Kidney disease Mother    Diabetes Father    Heart disease Father    Diabetes Brother    Colon cancer Neg Hx     Past Surgical History:  Procedure Laterality Date   ABDOMINAL AORTOGRAM W/LOWER EXTREMITY  Bilateral 12/18/2020   Procedure: ABDOMINAL AORTOGRAM W/LOWER EXTREMITY;  Surgeon: Elam Dutch, MD;  Location: [facility];  Service: Cardiovascular;  Laterality: Bilateral;   ANKLE FRACTURE SURGERY     AV FISTULA PLACEMENT Left 08/18/2020   Procedure: LEFT ARM BRACHIOCEPHALIC ARTERIOVENOUS (AV) FISTULA CREATION;  Surgeon: Elam Dutch, MD;  Location: [facility];  Service: Vascular;  Laterality: Left;   BIOPSY  04/24/2021   Procedure: BIOPSY;  Surgeon: Eloise Harman, DO;  [facility];  Service: Endoscopy;;   CESAREAN SECTION     CHOLECYSTECTOMY     COLONOSCOPY  04/24/2021   Surgeon: Eloise Harman,  DO;  nonbleeding internal hemorrhoids, 1 large (25 mm) pedunculated transverse colon polyp (prolapse type polyp) with adherent clot and stigmata of recent bleed.   COLONOSCOPY WITH PROPOFOL N/A 05/14/2021   Procedure: COLONOSCOPY WITH PROPOFOL;  Surgeon: Daneil Dolin, MD;  [facility];  Service: Endoscopy;  Laterality: N/A;   ESOPHAGOGASTRODUODENOSCOPY (EGD) WITH PROPOFOL N/A 04/24/2021   Surgeon: Eloise Harman, DO;  duodenal erosions and gastritis biopsied (pathology with peptic duodenitis, reactive gastropathy with erosions/chronic inflammation, negative for H. pylori)   EYE SURGERY     Vatrectomy   HEMOSTASIS CLIP PLACEMENT  05/14/2021   Procedure: HEMOSTASIS CLIP PLACEMENT;  Surgeon: Daneil Dolin, MD;  [facility];  Service: Endoscopy;;   IR PERC TUN PERIT CATH WO PORT S&I /IMAG  09/15/2020   IR REMOVAL TUN CV CATH W/O FL  02/19/2021   IR US GUIDE VASC ACCESS RIGHT  09/15/2020   POLYPECTOMY  04/24/2021   Procedure: POLYPECTOMY;  Surgeon: Eloise Harman, DO;  [facility];  Service: Endoscopy;;   POLYPECTOMY  05/14/2021   Procedure: POLYPECTOMY;  Surgeon: Daneil Dolin, MD;  [facility];  Service: Endoscopy;;   TOE SURGERY     Social History   Occupational History   Not on file  Tobacco Use    Smoking status: Never   Smokeless tobacco: Never  Vaping Use   Vaping Use: Never used  Substance and Sexual Activity   Alcohol use: No   Drug use: No   Sexual activity: Yes    Birth control/protection: Condom

## 2021-09-03 NOTE — Consult Note (Addendum)
[facility] Renal Consultation Note    Indication for Consultation:  Management of ESRD/hemodialysis, anemia, hypertension/volume, and secondary hyperparathyroidism. PCP:  HPI: Amy Moses is a 50 y.o. female with ESRD, HTN, T2DM, hypothyroidism, and BLE calciphylaxis who was admitted for elective skin grafting to B legs.  Seen in PACU and she is drowsy. Per notes - BLE have continued to improve from prior condition, saw Dr. [name] on 10/10 who felt that legs warranted further debridement with skin graft placement wqhich she underwent this morning. Denies fever, chills, CP, dyspnea, N/V/D, dysuria.  Labs today with Na 136, K 4.6, Hgb 11.2, COVID negative.  Dialyzes at [facility] on TTS schedule - last HD was yesterday via LUE AVF.  Past Medical History:  Diagnosis Date   Anemia    Blindness of right eye with low vision in contralateral eye    s/p victrectomy   Diabetes mellitus, type II ([place])    Dyslipidemia    Glaucoma    Hypertension    Hypothyroidism (acquired)    Kidney disease    Stage 5   Pneumonia    Past Surgical History:  Procedure Laterality Date   ABDOMINAL AORTOGRAM W/LOWER EXTREMITY Bilateral 12/18/2020   Procedure: ABDOMINAL AORTOGRAM W/LOWER EXTREMITY;  Surgeon: Elam Dutch, MD;  Location: [facility];  Service: Cardiovascular;  Laterality: Bilateral;   ANKLE FRACTURE SURGERY Right    AV FISTULA PLACEMENT Left 08/18/2020   Procedure: LEFT ARM BRACHIOCEPHALIC ARTERIOVENOUS (AV) FISTULA CREATION;  Surgeon: Elam Dutch, MD;  Location: [facility];  Service: Vascular;  Laterality: Left;   BIOPSY  04/24/2021   Procedure: BIOPSY;  Surgeon: Eloise Harman, DO;  [facility];  Service: Endoscopy;;   CESAREAN SECTION     CHOLECYSTECTOMY     COLONOSCOPY  04/24/2021   Surgeon: Eloise Harman, DO;  nonbleeding internal hemorrhoids, 1 large (25 mm) pedunculated transverse colon polyp (prolapse type polyp) with  adherent clot and stigmata of recent bleed.   COLONOSCOPY WITH PROPOFOL N/A 05/14/2021   Procedure: COLONOSCOPY WITH PROPOFOL;  Surgeon: Daneil Dolin, MD;  [facility];  Service: Endoscopy;  Laterality: N/A;   ESOPHAGOGASTRODUODENOSCOPY (EGD) WITH PROPOFOL N/A 04/24/2021   Surgeon: Eloise Harman, DO;  duodenal erosions and gastritis biopsied (pathology with peptic duodenitis, reactive gastropathy with erosions/chronic inflammation, negative for H. pylori)   EYE SURGERY     Vatrectomy   HEMOSTASIS CLIP PLACEMENT  05/14/2021   Procedure: HEMOSTASIS CLIP PLACEMENT;  Surgeon: Daneil Dolin, MD;  [facility];  Service: Endoscopy;;   IR PERC TUN PERIT CATH WO PORT S&I /IMAG  09/15/2020   IR REMOVAL TUN CV CATH W/O FL  02/19/2021   IR US GUIDE VASC ACCESS RIGHT  09/15/2020   POLYPECTOMY  04/24/2021   Procedure: POLYPECTOMY;  Surgeon: Eloise Harman, DO;  [facility];  Service: Endoscopy;;   POLYPECTOMY  05/14/2021   Procedure: POLYPECTOMY;  Surgeon: Daneil Dolin, MD;  [facility];  Service: Endoscopy;;   TOE SURGERY     Family History  Problem Relation Age of Onset   Heart disease Mother    Diabetes Mother    Kidney disease Mother    Diabetes Father    Heart disease Father    Diabetes Brother    Colon cancer Neg Hx    Social History:  reports that she has never smoked. She has never used smokeless tobacco. She reports that she does  not drink alcohol and does not use drugs.  ROS: As per HPI otherwise negative.  Physical Exam: Vitals:   09/03/21 0754 09/03/21 1240 09/03/21 1255 09/03/21 1310  BP: (!) 149/73 (!) 99/57 106/63 119/70  Pulse: 91 75 71 72  Resp: '18 19 17 16  '$ Temp: 97.6 F (36.4 C) 97.6 F (36.4 C)    TempSrc: Oral     SpO2: 97% 98% (!) 89% 99%  Weight: 86.2 kg     Height: '5\' 6"'$  (1.676 m)        General: Well developed, well nourished, in no acute distress. Scattered scabbed/scarred areas to BUE. Head:  Normocephalic, atraumatic, sclera non-icteric, mucus membranes are moist. Neck: Supple without lymphadenopathy/masses. JVD not elevated. Lungs: Clear bilaterally to auscultation without wheezes, rales, or rhonchi. Breathing is unlabored. Heart: RRR with normal S1, S2. No murmurs, rubs, or gallops appreciated. Abdomen: Soft, non-tender, non-distended with normoactive bowel sounds. No rebound/guarding.  Lower extremities: BLE wrapped with compression with wound vac tubing in place Neuro: Alert and oriented X 3. Moves all extremities spontaneously, albeit slowly d/t drowsiness s/p surgery. Psych:  Responds to questions appropriately with a normal affect. Dialysis Access: LUE AVF + bruit  Allergies  Allergen Reactions   Ace Inhibitors Cough   Prior to Admission medications   Medication Sig Start Date End Date Taking? Authorizing Provider  acetaminophen (TYLENOL) 500 MG tablet Take 1,000 mg by mouth every 6 (six) hours as needed for moderate pain.   Yes [provider]  aspirin EC 81 MG tablet Take 1 tablet (81 mg total) by mouth daily with breakfast. 05/19/21 05/19/22 Yes Johnson, Clanford L, MD  atorvastatin (LIPITOR) 10 MG tablet Take 1 tablet (10 mg total) by mouth daily. Patient taking differently: Take 10 mg by mouth at bedtime. 04/25/21  Yes Emokpae, Courage, MD  HUMALOG KWIKPEN 100 UNIT/ML KwikPen Inject 2 Units into the skin 3 (three) times daily with meals. If eats 50% or more of meal. Patient taking differently: Inject 5-15 Units into the skin 3 (three) times daily with meals. Sliding Scale If eats 50% or more of meal. 05/15/21  Yes Johnson, Clanford L, MD  insulin glargine (LANTUS) 100 UNIT/ML injection Inject 0.08 mLs (8 Units total) into the skin at bedtime. 08/11/21  Yes Elodia Florence., MD  levothyroxine (SYNTHROID) 50 MCG tablet Take 1 tablet (50 mcg total) by mouth daily before breakfast. 04/25/21  Yes Emokpae, Courage, MD  pantoprazole (PROTONIX) 40 MG tablet Take 1  tablet (40 mg total) by mouth 2 (two) times daily. Patient taking differently: Take 40 mg by mouth daily. 04/25/21 04/25/22 Yes Emokpae, Courage, MD  sevelamer carbonate (RENVELA) 800 MG tablet Take 1 tablet (800 mg total) by mouth 3 (three) times daily with meals. 04/25/21  Yes Emokpae, Courage, MD  torsemide (DEMADEX) 100 MG tablet Take 150 mg by mouth daily.  05/22/20  Yes [provider]  Vitamin D, Ergocalciferol, (DRISDOL) 1.25 MG (50000 UNIT) CAPS capsule Take 50,000 Units by mouth every 14 (fourteen) days. 04/20/21  Yes [provider]  camphor-menthol Timoteo Ace) lotion Apply 1 application topically as needed for itching. Patient not taking: No sig reported 05/17/21   Irwin Brakeman L, MD  cholestyramine light (PREVALITE) 4 g packet Take 1 packet (4 g total) by mouth 2 (two) times daily. Patient not taking: No sig reported 05/19/21 11/15/21  Eloise Harman, DO  metoprolol succinate (TOPROL-XL) 50 MG 24 hr tablet Take 1 tablet (50 mg total) by mouth at bedtime.  Take with or immediately following a meal. 04/25/21   Emokpae, Courage, MD  polyethylene glycol (MIRALAX) 17 g packet Take 17 g by mouth daily as needed for mild constipation. Patient not taking: Reported on 09/01/2021 05/11/21   Barton Dubois, MD  Zinc Oxide (TRIPLE PASTE) 12.8 % ointment Apply topically 3 (three) times daily. To buttocks Patient not taking: Reported on 09/01/2021 08/11/21   Elodia Florence., MD   Current Facility-Administered Medications  Medication Dose Route Frequency Provider Last Rate Last Admin   0.9 %  sodium chloride infusion   Intravenous Continuous Lidia Collum, MD 10 mL/hr at 09/03/21 0825 New Bag at 09/03/21 0825   dextrose 50 % solution            fentaNYL (SUBLIMAZE) injection 25-50 mcg  25-50 mcg Intravenous Q5 min PRN Lidia Collum, MD       ondansetron [facility]) injection 4 mg  4 mg Intravenous Once PRN Lidia Collum, MD       oxyCODONE (Oxy IR/ROXICODONE) immediate  release tablet 5 mg  5 mg Oral Once PRN Lidia Collum, MD       Or   oxyCODONE (ROXICODONE) 5 MG/5ML solution 5 mg  5 mg Oral Once PRN Lidia Collum, MD       tranexamic acid (CYKLOKAPRON) 2,000 mg in sodium chloride 0.9 % 50 mL Topical Application  [id number] mg Topical To OR Suzan Slick, NP       Labs: Basic Metabolic Panel: Recent Labs  Lab 09/03/21 0809  NA 136  K 4.6  CL 101  GLUCOSE 193*  BUN 46*  CREATININE 3.90*   CBC: Recent Labs  Lab 09/03/21 0809  HGB 11.2*  HCT 33.0*   CBG: Recent Labs  Lab 09/03/21 0756 09/03/21 1024 09/03/21 1240 09/03/21 1305  GLUCAP 191* 109* 69* 99    Dialysis Orders:  TTS at [facility] [phone]) 4hr, 400/500, EDW 91kg, 2K/2Ca, LUE AVF, heparin 2000 bolus + 800/hr - Epo 10000 units q HD - Venofer '50mg'$  IV weekly - No VDRA (calcitriol) or Na Thiosulfate (was discontinued last mo) - HBsAg negative 08/08/21 (here)  Assessment/Plan:  BLE calciphlaxis s/p debridement and skin grafting: Per Dr. [name]  ESRD:  Continue HD on TTS schedule -> HD tomorrow, no heparin.  Hypertension/volume: BP controlled, no edema on exam. Per [facility] weights she is well below dry weight.  Anemia: Hgb 11.2, restart ESA once Hgb under 11  Metabolic bone disease: Check labs - on 2K/2Ca bath.  Nutrition:  Will start protein supplements.  T2DM  [name], Hershal Coria 09/03/2021, 2:13 PM  [facility]  Nephrology attending: Patient was seen and examined in PACU.  Chart reviewed.  I agree with the consult note as outlined above. Status post wound debridement and is current grafting of lower extremity wound today.  Recovering in PACU.  Plan for regular dialysis tomorrow.  Katheran James, MD [name].

## 2021-09-04 LAB — CBC
HCT: 29.8 % — ABNORMAL LOW (ref 36.0–46.0)
Hemoglobin: 8.7 g/dL — ABNORMAL LOW (ref 12.0–15.0)
MCH: 24.9 pg — ABNORMAL LOW (ref 26.0–34.0)
MCHC: 29.2 g/dL — ABNORMAL LOW (ref 30.0–36.0)
MCV: 85.4 fL (ref 80.0–100.0)
Platelets: 276 10*3/uL (ref 150–400)
RBC: 3.49 MIL/uL — ABNORMAL LOW (ref 3.87–5.11)
RDW: 19.7 % — ABNORMAL HIGH (ref 11.5–15.5)
WBC: 8.9 10*3/uL (ref 4.0–10.5)
nRBC: 0 % (ref 0.0–0.2)

## 2021-09-04 LAB — RENAL FUNCTION PANEL
Albumin: 2.7 g/dL — ABNORMAL LOW (ref 3.5–5.0)
Anion gap: 16 — ABNORMAL HIGH (ref 5–15)
BUN: 54 mg/dL — ABNORMAL HIGH (ref 6–20)
CO2: 21 mmol/L — ABNORMAL LOW (ref 22–32)
Calcium: 9 mg/dL (ref 8.9–10.3)
Chloride: 96 mmol/L — ABNORMAL LOW (ref 98–111)
Creatinine, Ser: 4.76 mg/dL — ABNORMAL HIGH (ref 0.44–1.00)
GFR, Estimated: 11 mL/min — ABNORMAL LOW (ref >60)
Glucose, Bld: 300 mg/dL — ABNORMAL HIGH (ref 70–99)
Phosphorus: 7.4 mg/dL — ABNORMAL HIGH (ref 2.5–4.6)
Potassium: 4.1 mmol/L (ref 3.5–5.1)
Sodium: 133 mmol/L — ABNORMAL LOW (ref 135–145)

## 2021-09-04 LAB — HEPATITIS B SURFACE ANTIGEN: Hepatitis B Surface Ag: NONREACTIVE

## 2021-09-04 LAB — HEPATITIS B SURFACE ANTIBODY,QUALITATIVE: Hep B S Ab: NONREACTIVE

## 2021-09-04 LAB — GLUCOSE, CAPILLARY
Glucose-Capillary: 349 mg/dL — ABNORMAL HIGH (ref 70–99)
Glucose-Capillary: 312 mg/dL — ABNORMAL HIGH (ref 70–99)
Glucose-Capillary: 217 mg/dL — ABNORMAL HIGH (ref 70–99)
Glucose-Capillary: 252 mg/dL — ABNORMAL HIGH (ref 70–99)

## 2021-09-04 MED ORDER — SODIUM CHLORIDE 0.9 % IV SOLN: 100.0000 mL | INTRAVENOUS | Status: DC | PRN

## 2021-09-04 MED ORDER — PENTAFLUOROPROP-TETRAFLUOROETH EX AERO: 1.0000 "application " | INHALATION_SPRAY | CUTANEOUS | Status: DC | PRN

## 2021-09-04 MED ORDER — LIDOCAINE-PRILOCAINE 2.5-2.5 % EX CREA
1.0000 "application " | TOPICAL_CREAM | CUTANEOUS | Status: DC | PRN
  Filled 2021-09-04: qty 5

## 2021-09-04 MED ORDER — LIDOCAINE HCL (PF) 1 % IJ SOLN: 5.0000 mL | INTRAMUSCULAR | Status: DC | PRN

## 2021-09-04 NOTE — Progress Notes (Signed)
PT Cancellation Note  Patient Details Name: Amy Moses MRN: [id number] DOB: 17-Feb-1971   Cancelled Treatment:    Reason Eval/Treat Not Completed: PT screened, no needs identified, will sign off  Patient states, "I don't even know why they ordered PT." Reports she cannot stand (due to heel ulcer and "messed up patella on the other leg"). Reports she uses sliding board to get in/out of her wheelchair and does not need PT to do this. States she has been dealing with these issues for awhile and not even HHPT sees her anymore.   PT eval deferred at patient's request. No DME or follow up PT indicated.    Arby Barrette, PT Acute Rehabilitation Services  Pager [phone] Office [phone]   Rexanne Mano 09/04/2021, 12:21 PM

## 2021-09-04 NOTE — Progress Notes (Addendum)
  [facility] Progress Note   Subjective:   Seen at start of HD - 1.5L UFG. No CP/dyspnea, no leg pain.  Objective Vitals:   09/04/21 0401 09/04/21 0500 09/04/21 0911 09/04/21 1154  BP: (!) 141/74  128/68 128/67  Pulse: 84  81 81  Resp: '17  16 16  '$ Temp: 98.2 F (36.8 C)  98.3 F (36.8 C) 98.5 F (36.9 C)  TempSrc: Oral  Oral Oral  SpO2: 100%  100% 100%  Weight:  93.9 kg    Height:       Physical Exam General: Well appearing woman, NAD Heart: RRR, no murmur Lungs: CTA anteriorly Abdomen: soft Extremities: No LE edema; BLE bandaged Dialysis Access: LUE AVF + bruit  Additional Objective Labs: Basic Metabolic Panel: Recent Labs  Lab 09/03/21 0809  NA 136  K 4.6  CL 101  GLUCOSE 193*  BUN 46*  CREATININE 3.90*   CBC: Recent Labs  Lab 09/03/21 0809  HGB 11.2*  HCT 33.0*   Medications:  sodium chloride 10 mL/hr at 09/03/21 1655   sodium chloride     sodium chloride     methocarbamol (ROBAXIN) IV      (feeding supplement) PROSource Plus  30 mL Oral BID BM   atorvastatin  10 mg Oral q1800   docusate sodium  100 mg Oral BID   insulin aspart  0-6 Units Subcutaneous TID WC   insulin aspart  2 Units Subcutaneous TID WC   insulin glargine-yfgn  5 Units Subcutaneous QHS   levothyroxine  50 mcg Oral QAC breakfast   metoprolol succinate  50 mg Oral QHS   pantoprazole  40 mg Oral Daily   sevelamer carbonate  800 mg Oral TID WC   torsemide  150 mg Oral Daily   [START ON 09/12/2021] Vitamin D (Ergocalciferol)  50,000 Units Oral Q14 Days    Dialysis Orders: TTS at [facility] [phone]) 4hr, 400/500, EDW 91kg, 2K/2Ca, LUE AVF, heparin 2000 bolus + 800/hr - Epo 10000 units q HD - Venofer '50mg'$  IV weekly - No VDRA (calcitriol) or Na Thiosulfate (was discontinued last mo) - HBsAg negative 08/08/21 (here)   Assessment/Plan:  BLE calciphlaxis s/p debridement and skin grafting: Per Dr. [name]  ESRD:  Continue HD on TTS schedule -> HD today, no  heparin.  Hypertension/volume: BP controlled, no edema on exam. 1.5L UFG today.  Anemia: Hgb 11.2, restart ESA once Hgb under 11  Metabolic bone disease: Check labs - on 2K/2Ca bath.  Nutrition:  Continue protein supplements.  T2DM  [name], PA-C 09/04/2021, 1:40 PM  [name]  Nephrology attending: Seen and examined. I agree with above.  Clinically stable. HD today. Discussed with Dr. [name] as well.   Katheran James, MD [facility].

## 2021-09-04 NOTE — Progress Notes (Signed)
Patient ID: Amy Moses, female   DOB: 1971-08-22, 50 y.o.   MRN: [id number] Patient is postoperative day 1 split-thickness skin grafts bilateral calfs for calciphylaxis.  The left leg wound VAC appears to have a blockage I will change out the tubing this morning.  Patient may have Ensure or other protein supplements.  Plan for dialysis today.  Possible discharge on Monday.

## 2021-09-05 ENCOUNTER — Encounter: Payer: Self-pay | Admitting: Orthopedic Surgery

## 2021-09-05 LAB — GLUCOSE, CAPILLARY
Glucose-Capillary: 293 mg/dL — ABNORMAL HIGH (ref 70–99)
Glucose-Capillary: 320 mg/dL — ABNORMAL HIGH (ref 70–99)
Glucose-Capillary: 185 mg/dL — ABNORMAL HIGH (ref 70–99)
Glucose-Capillary: 220 mg/dL — ABNORMAL HIGH (ref 70–99)

## 2021-09-05 LAB — HEPATITIS B SURFACE ANTIBODY, QUANTITATIVE: Hep B S AB Quant (Post): <3.1 m[IU]/mL — ABNORMAL LOW (ref >9.9)

## 2021-09-05 MED ORDER — SEVELAMER CARBONATE 800 MG PO TABS
1600.0000 mg | ORAL_TABLET | Freq: Three times a day (TID) | ORAL | Status: DC
  Administered 2021-09-05 – 2021-09-06 (×3): 1600 mg via ORAL
  Filled 2021-09-05 (×4): qty 2

## 2021-09-05 MED ORDER — DARBEPOETIN ALFA 100 MCG/0.5ML IJ SOSY: 100.0000 ug | PREFILLED_SYRINGE | INTRAMUSCULAR | Status: DC

## 2021-09-05 NOTE — Progress Notes (Addendum)
  [facility] Progress Note   Subjective:  Seen in room. No CP/dyspnea. No pain at the moment.  Objective Vitals:   09/04/21 1817 09/04/21 2014 09/05/21 0007 09/05/21 0735  BP: 123/65 127/67 130/79 (!) 145/79  Pulse: 77 80 72 72  Resp: '18 17 17 16  '$ Temp: 98.2 F (36.8 C) 98.2 F (36.8 C) 97.7 F (36.5 C) 97.6 F (36.4 C)  TempSrc: Oral Oral Oral Oral  SpO2: 97% 97% 100% 99%  Weight:      Height:       Physical Exam General: Well appearing woman, NAD Heart: RRR, no murmur Lungs: CTA anteriorly Abdomen: soft Extremities: No LE edema; BLE bandaged Dialysis Access: LUE AVF + bruit  Additional Objective Labs: Basic Metabolic Panel: Recent Labs  Lab 09/03/21 0809 09/04/21 1419  NA 136 133*  K 4.6 4.1  CL 101 96*  CO2  --  21*  GLUCOSE 193* 300*  BUN 46* 54*  CREATININE 3.90* 4.76*  CALCIUM  --  9.0  PHOS  --  7.4*   Liver Function Tests: Recent Labs  Lab 09/04/21 1419  ALBUMIN 2.7*   CBC: Recent Labs  Lab 09/03/21 0809 09/04/21 1419  WBC  --  8.9  HGB 11.2* 8.7*  HCT 33.0* 29.8*  MCV  --  85.4  PLT  --  276  Medications:  sodium chloride 10 mL/hr at 09/03/21 1655   sodium chloride     sodium chloride     methocarbamol (ROBAXIN) IV      (feeding supplement) PROSource Plus  30 mL Oral BID BM   atorvastatin  10 mg Oral q1800   docusate sodium  100 mg Oral BID   insulin aspart  0-6 Units Subcutaneous TID WC   insulin aspart  2 Units Subcutaneous TID WC   insulin glargine-yfgn  5 Units Subcutaneous QHS   levothyroxine  50 mcg Oral QAC breakfast   metoprolol succinate  50 mg Oral QHS   pantoprazole  40 mg Oral Daily   sevelamer carbonate  800 mg Oral TID WC   torsemide  150 mg Oral Daily   [START ON 09/12/2021] Vitamin D (Ergocalciferol)  50,000 Units Oral Q14 Days    Dialysis Orders: TTS at [facility] [phone]) 4hr, 400/500, EDW 91kg, 2K/2Ca, LUE AVF, heparin 2000 bolus + 800/hr - Epo 10000 units q HD - Venofer  '50mg'$  IV weekly - No VDRA (calcitriol) or Na Thiosulfate (was discontinued last mo) - HBsAg negative 08/08/21 (here)   Assessment/Plan:  BLE calciphlaxis s/p debridement and skin grafting: Per Dr. [name]  ESRD:  Continue HD on TTS schedule -> next 10/18  Hypertension/volume: BP controlled, no edema on exam.   Anemia: Hgb 11.2 -> 8.7, will restart ESA with next HD  Metabolic bone disease: Ca ok, Phos high - ^ Renvela to 2/meals. 2K/2Ca bath.  Nutrition:  Continue protein supplements.  T2DM  [name], Hershal Coria 09/05/2021, 10:31 AM  [facility]  Nephrology attending: Seen and examined.  I agree with the above. Tolerated dialysis yesterday.  Recovering from surgery and seems like doing well.  Plan for regular dialysis on next Tuesday.  Katheran James, MD [facility].

## 2021-09-05 NOTE — Progress Notes (Signed)
Pt resting comfortably this AM.  Pain well controlled  Vitals:   09/05/21 0007 09/05/21 0735  BP: 130/79 (!) 145/79  Pulse: 72 72  Resp: 17 16  Temp: 97.7 F (36.5 C) 97.6 F (36.4 C)  SpO2: 100% 99%    Resting comfortably NAD Normal WOB on RA Normal rate, BLE warm and well perfused BLE: dressing and [place] intact w/ good seal  50 yo F s/p bilateral STST to bilateral calves for calciphylaxis.  Doing well postoperatively  Nephrology following, HD yesterday Potential DC tomorrow.   Sherilyn Cooter, M.D. [place]

## 2021-09-05 NOTE — Progress Notes (Signed)
Office Visit Note   Patient: Amy Moses           Date of Birth: Jun 13, 1971           MRN: [id number] Visit Date: 08/23/2021              Requested by: Wynelle Cleveland, MD No address on file PCP: Wynelle Cleveland, MD  Chief Complaint  Patient presents with   Right Leg - Wound Check   Left Leg - Wound Check      HPI: Patient is a 50 year old woman on dialysis with calciphylaxis with slowly improving chronic ulcers on the posterior aspect of both calves.  She is undergoing serial compression wraps at this time anticipate advancing to skin grafting soon.  Assessment & Plan: Visit Diagnoses:  1. Calciphylaxis of right lower extremity with nonhealing ulcer, limited to breakdown of skin ([place])   2. Calciphylaxis of left lower extremity with nonhealing ulcer with necrosis of muscle ([place])     Plan: Both legs were wrapped with a 3 layer compression wrap reevaluate on Friday.  Follow-Up Instructions: Return in about 1 week (around 08/30/2021).   Ortho Exam  Patient is alert, oriented, no adenopathy, well-dressed, normal affect, normal respiratory effort. Examination both legs show decreased swelling there is improved granulation tissue of the wounds with the large necrotic tissue being debrided.  She has a right heel decubitus ulcer that is 2 cm in diameter flat with healthy tissue.  Imaging: No results found. No images are attached to the encounter.  Labs: Lab Results  Component Value Date   HGBA1C 9.5 (H) 08/07/2021   HGBA1C 7.3 (H) 03/30/2021   HGBA1C 7.4 (H) 06/10/2020   ESRSEDRATE 40 (H) 08/07/2021   CRP 11.1 (H) 08/07/2021   REPTSTATUS 08/12/2021 FINAL 08/07/2021   GRAMSTAIN  07/08/2021    RARE WBC PRESENT,BOTH PMN AND MONONUCLEAR RARE GRAM POSITIVE RODS RARE GRAM POSITIVE COCCI IN PAIRS    CULT  08/07/2021    NO GROWTH 5 DAYS Performed at [facility], [place] [address], [place], [place] [address]    LABORGA CITROBACTER KOSERI 05/21/2021   LABORGA  PROTEUS MIRABILIS 05/21/2021     Lab Results  Component Value Date   ALBUMIN 2.7 (L) 09/04/2021   ALBUMIN 2.4 (L) 08/12/2021   ALBUMIN 2.1 (L) 08/10/2021   PREALBUMIN 13.7 (L) 08/07/2021    Lab Results  Component Value Date   MG 2.1 05/14/2021   MG 2.8 (H) 05/11/2021   MG 2.3 04/23/2021   No results found for: VD25OH  Lab Results  Component Value Date   PREALBUMIN 13.7 (L) 08/07/2021   CBC EXTENDED Latest Ref Rng & Units 09/04/2021 09/03/2021 08/12/2021  WBC 4.0 - 10.5 K/uL 8.9 - 8.1  RBC 3.87 - 5.11 MIL/uL 3.49(L) - 4.21  HGB 12.0 - 15.0 g/dL 8.7(L) 11.2(L) 10.1(L)  HCT 36.0 - 46.0 % 29.8(L) 33.0(L) 33.4(L)  PLT 150 - 400 K/uL 276 - 294  NEUTROABS 1.7 - 7.7 K/uL - - -  LYMPHSABS 0.7 - 4.0 K/uL - - -     There is no height or weight on file to calculate BMI.  Orders:  No orders of the defined types were placed in this encounter.  No orders of the defined types were placed in this encounter.    Procedures: No procedures performed  Clinical Data: No additional findings.  ROS:  All other systems negative, except as noted in the HPI. Review of Systems  Objective: Vital Signs: LMP 08/22/2015 (  Approximate)   Specialty Comments:  No specialty comments available.  PMFS History: Patient Active Problem List   Diagnosis Date Noted   Open leg wound 09/03/2021   Wound infection    Non-pressure chronic ulcer of right calf limited to breakdown of skin ([place])    Calciphylaxis of right lower extremity with nonhealing ulcer, limited to breakdown of skin ([place])    Chronic ulcer of left thigh ([place]) 08/07/2021   Calciphylaxis of left lower extremity with nonhealing ulcer with necrosis of muscle ([place]) 08/07/2021   Lower GI bleed 05/12/2021   Blindness 05/10/2021   Shingles 05/10/2021   Acute GI bleeding 04/24/2021   Acute blood loss anemia 04/23/2021   GI bleed 04/22/2021   Fever 03/31/2021   Acute on chronic heart failure with preserved ejection fraction (HFpEF)  ([place]) 03/30/2021   Pressure injury of skin 03/30/2021   End stage renal disease ([place]) 11/16/2020   Calciphylaxis 11/06/2020   Non-healing open wound of heel 11/03/2020   Diabetic foot infection ([place]) 11/01/2020   Decubitus ulcer, heel 11/01/2020   Closed nondisplaced fracture of left patella [id number]   Metabolic acidosis [id number]   Acute on chronic renal failure ([place]) 06/10/2020   Symptomatic anemia 06/10/2020   Acute pericardial effusion 06/10/2020   Other acute nonsuppurative otitis media, unspecified ear 11/29/2019   Chronic kidney disease, stage 4 (severe) ([place]) 03/05/2019   Skin ulcer, limited to breakdown of skin ([place]) 01/28/2019   Vitamin D deficiency 01/28/2019   Uncontrolled type 2 diabetes mellitus with chronic kidney disease, with long-term current use of insulin 09/21/2015   Hyperlipidemia 09/21/2015   Essential hypertension, benign 09/21/2015   Primary hypothyroidism 09/21/2015   Iris bomb 07/31/2012   Secondary angle-closure glaucoma 07/31/2012   Past Medical History:  Diagnosis Date   Anemia    Blindness of right eye with low vision in contralateral eye    s/p victrectomy   Diabetes mellitus, type II ([place])    Dyslipidemia    Glaucoma    Hypertension    Hypothyroidism (acquired)    Kidney disease    Stage 5   Pneumonia     Family History  Problem Relation Age of Onset   Heart disease Mother    Diabetes Mother    Kidney disease Mother    Diabetes Father    Heart disease Father    Diabetes Brother    Colon cancer Neg Hx     Past Surgical History:  Procedure Laterality Date   ABDOMINAL AORTOGRAM W/LOWER EXTREMITY Bilateral 12/18/2020   Procedure: ABDOMINAL AORTOGRAM W/LOWER EXTREMITY;  Surgeon: Elam Dutch, MD;  Location: [facility];  Service: Cardiovascular;  Laterality: Bilateral;   ANKLE FRACTURE SURGERY Right    AV FISTULA PLACEMENT Left 08/18/2020   Procedure: LEFT ARM BRACHIOCEPHALIC ARTERIOVENOUS (AV) FISTULA CREATION;   Surgeon: Elam Dutch, MD;  Location: [facility];  Service: Vascular;  Laterality: Left;   BIOPSY  04/24/2021   Procedure: BIOPSY;  Surgeon: Eloise Harman, DO;  [facility];  Service: Endoscopy;;   CESAREAN SECTION     CHOLECYSTECTOMY     COLONOSCOPY  04/24/2021   Surgeon: Eloise Harman, DO;  nonbleeding internal hemorrhoids, 1 large (25 mm) pedunculated transverse colon polyp (prolapse type polyp) with adherent clot and stigmata of recent bleed.   COLONOSCOPY WITH PROPOFOL N/A 05/14/2021   Procedure: COLONOSCOPY WITH PROPOFOL;  Surgeon: Daneil Dolin, MD;  [facility];  Service: Endoscopy;  Laterality: N/A;   ESOPHAGOGASTRODUODENOSCOPY (  EGD) WITH PROPOFOL N/A 04/24/2021   Surgeon: Eloise Harman, DO;  duodenal erosions and gastritis biopsied (pathology with peptic duodenitis, reactive gastropathy with erosions/chronic inflammation, negative for H. pylori)   EYE SURGERY     Vatrectomy   HEMOSTASIS CLIP PLACEMENT  05/14/2021   Procedure: HEMOSTASIS CLIP PLACEMENT;  Surgeon: Daneil Dolin, MD;  [facility];  Service: Endoscopy;;   IR PERC TUN PERIT CATH WO PORT S&I /IMAG  09/15/2020   IR REMOVAL TUN CV CATH W/O FL  02/19/2021   IR US GUIDE VASC ACCESS RIGHT  09/15/2020   POLYPECTOMY  04/24/2021   Procedure: POLYPECTOMY;  Surgeon: Eloise Harman, DO;  [facility];  Service: Endoscopy;;   POLYPECTOMY  05/14/2021   Procedure: POLYPECTOMY;  Surgeon: Daneil Dolin, MD;  [facility];  Service: Endoscopy;;   TOE SURGERY     Social History   Occupational History   Not on file  Tobacco Use   Smoking status: Never   Smokeless tobacco: Never  Vaping Use   Vaping Use: Never used  Substance and Sexual Activity   Alcohol use: No   Drug use: No   Sexual activity: Yes    Birth control/protection: Condom

## 2021-09-06 ENCOUNTER — Encounter (HOSPITAL_COMMUNITY): Payer: Self-pay | Admitting: Orthopedic Surgery

## 2021-09-06 LAB — GLUCOSE, CAPILLARY
Glucose-Capillary: 260 mg/dL — ABNORMAL HIGH (ref 70–99)
Glucose-Capillary: 186 mg/dL — ABNORMAL HIGH (ref 70–99)
Glucose-Capillary: 146 mg/dL — ABNORMAL HIGH (ref 70–99)

## 2021-09-06 MED ORDER — OXYCODONE-ACETAMINOPHEN 5-325 MG PO TABS: 1.0000 | ORAL_TABLET | ORAL | 0 refills | Status: DC | PRN

## 2021-09-06 NOTE — Progress Notes (Signed)
Contacted [name] to advise them of pt's d/c today. Clinic aware pt will resume care tomorrow. Pt's schedule is TTS on first shift per staff. D/C summary faxed to clinic for continuation of care.   Melven Sartorius Renal Navigator [phone]

## 2021-09-06 NOTE — Discharge Summary (Signed)
Discharge Diagnoses:  Active Problems:   Open leg wound   Surgeries: Procedure(s): SKIN GRAFT BILATERAL LEGS on 09/03/2021    Consultants: Treatment Team:  Rosita Fire, MD  Discharged Condition: Improved  Hospital Course: Amy Moses is an 50 y.o. female who was admitted 09/03/2021 with a chief complaint of calciphylaxis ulcers bilateral legs., with a final diagnosis of ulcers bilateral legs.  Patient was brought to the operating room on 09/03/2021 and underwent Procedure(s): SKIN GRAFT BILATERAL LEGS.    Patient was given perioperative antibiotics:  Anti-infectives (From admission, onward)    Start     Dose/Rate Route Frequency Ordered Stop   09/03/21 0746  ceFAZolin (ANCEF) 2-4 GM/100ML-% IVPB       Note to Pharmacy: [name], [place]   : cabinet override      09/03/21 0746 09/03/21 1200   09/03/21 0745  ceFAZolin (ANCEF) IVPB 2g/100 mL premix        2 g 200 mL/hr over 30 Minutes Intravenous On call to O.R. 09/03/21 [id number] 09/03/21 1150     .  Patient was given sequential compression devices, early ambulation, and aspirin for DVT prophylaxis.  Recent vital signs: Patient Vitals for the past 24 hrs:  BP Temp Temp src Pulse Resp SpO2  09/06/21 0441 (!) 146/81 98 F (36.7 C) Oral 90 20 100 %  09/05/21 2119 135/75 97.9 F (36.6 C) Oral 79 20 100 %  09/05/21 1428 139/79 97.7 F (36.5 C) Oral 73 17 100 %  09/05/21 0735 (!) 145/79 97.6 F (36.4 C) Oral 72 16 99 %  .  Recent laboratory studies: No results found.  Discharge Medications:   Allergies as of 09/06/2021       Reactions   Ace Inhibitors Cough        Medication List     STOP taking these medications    cholestyramine light 4 g packet Commonly known as: PREVALITE   metoprolol succinate 50 MG 24 hr tablet Commonly known as: TOPROL-XL   polyethylene glycol 17 g packet Commonly known as: MiraLax   Sarna lotion Generic drug: camphor-menthol   Zinc Oxide 12.8 % ointment Commonly known  as: TRIPLE PASTE       TAKE these medications    acetaminophen 500 MG tablet Commonly known as: TYLENOL Take 1,000 mg by mouth every 6 (six) hours as needed for moderate pain.   aspirin EC 81 MG tablet Take 1 tablet (81 mg total) by mouth daily with breakfast.   atorvastatin 10 MG tablet Commonly known as: LIPITOR Take 1 tablet (10 mg total) by mouth daily. What changed: when to take this   HumaLOG KwikPen 100 UNIT/ML KwikPen Generic drug: insulin lispro Inject 2 Units into the skin 3 (three) times daily with meals. If eats 50% or more of meal. What changed:  how much to take additional instructions   insulin glargine 100 UNIT/ML injection Commonly known as: LANTUS Inject 0.08 mLs (8 Units total) into the skin at bedtime.   levothyroxine 50 MCG tablet Commonly known as: SYNTHROID Take 1 tablet (50 mcg total) by mouth daily before breakfast.   oxyCODONE-acetaminophen 5-325 MG tablet Commonly known as: PERCOCET/ROXICET Take 1 tablet by mouth every 4 (four) hours as needed.   pantoprazole 40 MG tablet Commonly known as: Protonix Take 1 tablet (40 mg total) by mouth 2 (two) times daily. What changed: when to take this   sevelamer carbonate 800 MG tablet Commonly known as: RENVELA Take 1 tablet (800 mg total) by mouth  3 (three) times daily with meals.   torsemide 100 MG tablet Commonly known as: DEMADEX Take 150 mg by mouth daily.   Vitamin D (Ergocalciferol) 1.25 MG (50000 UNIT) Caps capsule Commonly known as: DRISDOL Take 50,000 Units by mouth every 14 (fourteen) days.        Diagnostic Studies: MR FEMUR LEFT WO CONTRAST  Result Date: 08/07/2021 CLINICAL DATA:  Soft tissue infection EXAM: MR OF THE LEFT FEMUR WITHOUT CONTRAST TECHNIQUE: Multiplanar, multisequence MR imaging of the left femur/thigh was performed. No intravenous contrast was administered. COMPARISON:  10/29/2020 FINDINGS: Bones/Joint/Cartilage Boundary artifact obscures the hips and knees, but  the femur is well visualized from the intertrochanteric region to the distal metaphysis. No marrow edema in this vicinity to indicate osteomyelitis. Ligaments N/A Muscles and Tendons There is diffuse intramuscular edema involving the anterior, medial, and posterior compartments of the left thigh in a roughly similar manner to the prior exam from 10/29/2020, with some mildly lesser degree of involvement in the hamstring musculature and hip adductor musculature although both are still involved especially along the periphery of the muscle bellies. Myositis and fasciitis are not excluded but I do not see obvious signs of gas or well-defined loculated components of abscess, and the similar appearance to December indicates that this could be a chronic appearance at least partially relating to neurogenic edema or severe third spacing of fluid rather than necessarily being infectious. On the coronal images there is a similar although mildly less striking appearance of edema in the right thigh musculature. Soft tissues Marked diffuse infiltrative subcutaneous edema throughout the thigh, increased from 10/29/2020, and tracking up into the buttock and anterior abdominal wall subcutaneous tissues. Similar appearance on the right side. IMPRESSION: 1. Bilateral marked subcutaneous and intramuscular edema diffusely in the thighs, with the intramuscular edema slightly greater on the left than the right. I do not see gas tracking in the soft tissues or a definite drainable abscess, and there is no evidence of osteomyelitis. Clearly in this setting, it is not feasible to exclude myositis or fasciitis by imaging given the diffuse abnormality, although other causes such as severe third spacing of fluid, or lymphatic or venous obstruction, could also cause this type of severe diffuse edema. A similar appearance was also present on 10/29/2020, although the degree of subcutaneous edema is worsened today. Electronically Signed   By: Van Clines M.D.   On: 08/07/2021 18:35    Patient benefited maximally from their hospital stay and there were no complications.     Disposition: Discharge disposition: 01-Home or Self Care      Discharge Instructions     Call MD / Call 911   Complete by: As directed    If you experience chest pain or shortness of breath, CALL 911 and be transported to the hospital emergency room.  If you develope a fever above 101 F, pus (white drainage) or increased drainage or redness at the wound, or calf pain, call your surgeon's office.   Constipation Prevention   Complete by: As directed    Drink plenty of fluids.  Prune juice may be helpful.  You may use a stool softener, such as Colace (over the counter) 100 mg twice a day.  Use MiraLax (over the counter) for constipation as needed.   Diet - low sodium heart healthy   Complete by: As directed    Increase activity slowly as tolerated   Complete by: As directed    Negative Pressure Wound Therapy - Incisional  Complete by: As directed    Post-operative opioid taper instructions:   Complete by: As directed    POST-OPERATIVE OPIOID TAPER INSTRUCTIONS: It is important to wean off of your opioid medication as soon as possible. If you do not need pain medication after your surgery it is ok to stop day one. Opioids include: Codeine, Hydrocodone(Norco, Vicodin), Oxycodone(Percocet, oxycontin) and hydromorphone amongst others.  Long term and even short term use of opiods can cause: Increased pain response Dependence Constipation Depression Respiratory depression And more.  Withdrawal symptoms can include Flu like symptoms Nausea, vomiting And more Techniques to manage these symptoms Hydrate well Eat regular healthy meals Stay active Use relaxation techniques(deep breathing, meditating, yoga) Do Not substitute Alcohol to help with tapering If you have been on opioids for less than two weeks and do not have pain than it is ok to stop all  together.  Plan to wean off of opioids This plan should start within one week post op of your joint replacement. Maintain the same interval or time between taking each dose and first decrease the dose.  Cut the total daily intake of opioids by one tablet each day Next start to increase the time between doses. The last dose that should be eliminated is the evening dose.          Follow-up Information     Newt Minion, MD Follow up in 1 week(s).   Specialty: Orthopedic Surgery Contact information: [address] [place] [address] [phone]                  Signed: Newt Minion 09/06/2021, 7:13 AM

## 2021-09-06 NOTE — Progress Notes (Signed)
Patient ID: Amy Moses, female   DOB: 10-14-1971, 50 y.o.   MRN: [id number] Patient without complaints this morning.  The wound vacs have a good suction fit.  I have plugged then the portable Praveena pumps to charge the batteries.  Plan for discharge today with the portable Praveena pumps.

## 2021-09-06 NOTE — TOC Initial Note (Signed)
Transition of Care [facility]) - Initial/Assessment Note    Patient Details  Name: Amy Moses MRN: [id number] Date of Birth: 04-02-1971  Transition [facility] Care [facility]) CM/SW Contact:    Marilu Favre, RN Phone Number: 09/06/2021, 10:49 AM  Clinical Narrative:                 Ent from home with boyfriend and daughter. Prior to admission she was active with [facility] and would like to continue services.   Malachy Mood with [facility] aware and will need resumption of care order and face to face. Secure chatted MD  Daughter will provide transportation home   Expected Discharge Plan: [place] Barriers to Discharge: No Barriers Identified   Patient Goals and CMS Choice Patient states their goals for this hospitalization and ongoing recovery are:: to return to home CMS Medicare.gov Compare Post Acute Care list provided to:: Patient Choice offered to / list presented to : Patient  Expected Discharge Plan and Services Expected Discharge Plan: [place]   Discharge Planning Services: CM Consult Post Acute Care Choice: [place] arrangements for the past 2 months: Single Family Home Expected Discharge Date: 09/06/21               DME Arranged: N/A DME Agency: NA       HH Arranged: PT HH Agency: [facility] Date HH Agency Contacted: 09/06/21 Time [place]: [id number] Representative spoke with at [place]: Malachy Mood  Prior Living Arrangements/Services Living arrangements for the past 2 months: [place] with:: Adult Children, Significant Other Patient language and need for interpreter reviewed:: Yes Do you feel safe going back to the place where you live?: Yes      Need for Family Participation in Patient Care: Yes (Comment) Care giver support system in place?: Yes (comment) Current home services: DME Criminal Activity/Legal Involvement Pertinent to Current Situation/Hospitalization: No - Comment as  needed  Activities of Daily Living Home Assistive Devices/Equipment: Wheelchair, Blood pressure cuff, CBG Meter, Eyeglasses, Bedside commode/3-in-1, Hospital bed ADL Screening (condition at time of admission) Patient's cognitive ability adequate to safely complete daily activities?: Yes Is the patient deaf or have difficulty hearing?: No Does the patient have difficulty seeing, even when wearing glasses/contacts?: Yes Does the patient have difficulty concentrating, remembering, or making decisions?: No Patient able to express need for assistance with ADLs?: Yes Does the patient have difficulty dressing or bathing?: No Independently performs ADLs?: Yes (appropriate for developmental age) Does the patient have difficulty walking or climbing stairs?: Yes Weakness of Legs: Both Weakness of Arms/Hands: None  Permission Sought/Granted   Permission granted to share information with : No              Emotional Assessment Appearance:: Appears stated age Attitude/Demeanor/Rapport: Engaged Affect (typically observed): Accepting Orientation: : Oriented to Situation, Oriented to  Time, Oriented to Place, Oriented to Self Alcohol / Substance Use: Not Applicable Psych Involvement: No (comment)  Admission diagnosis:  Open leg wound [S81.809A] Patient Active Problem List   Diagnosis Date Noted   Open leg wound 09/03/2021   Wound infection    Non-pressure chronic ulcer of right calf limited to breakdown of skin ([place])    Calciphylaxis of right lower extremity with nonhealing ulcer, limited to breakdown of skin ([place])    Chronic ulcer of left thigh ([place]) 08/07/2021   Calciphylaxis of left lower extremity with nonhealing ulcer with necrosis of muscle ([place]) 08/07/2021   Lower  GI bleed 05/12/2021   Blindness 05/10/2021   Shingles 05/10/2021   Acute GI bleeding 04/24/2021   Acute blood loss anemia 04/23/2021   GI bleed 04/22/2021   Fever 03/31/2021   Acute on chronic heart failure with  preserved ejection fraction (HFpEF) ([place]) 03/30/2021   Pressure injury of skin 03/30/2021   End stage renal disease ([place]) 11/16/2020   Calciphylaxis 11/06/2020   Non-healing open wound of heel 11/03/2020   Diabetic foot infection ([place]) 11/01/2020   Decubitus ulcer, heel 11/01/2020   Closed nondisplaced fracture of left patella [id number]   Metabolic acidosis [id number]   Acute on chronic renal failure ([place]) 06/10/2020   Symptomatic anemia 06/10/2020   Acute pericardial effusion 06/10/2020   Other acute nonsuppurative otitis media, unspecified ear 11/29/2019   Chronic kidney disease, stage 4 (severe) ([place]) 03/05/2019   Skin ulcer, limited to breakdown of skin ([place]) 01/28/2019   Vitamin D deficiency 01/28/2019   Uncontrolled type 2 diabetes mellitus with chronic kidney disease, with long-term current use of insulin 09/21/2015   Hyperlipidemia 09/21/2015   Essential hypertension, benign 09/21/2015   Primary hypothyroidism 09/21/2015   Iris bomb 07/31/2012   Secondary angle-closure glaucoma 07/31/2012   PCP:  Wynelle Cleveland, MD Pharmacy:   [id number] [place] [place] [address]Phone: [phone]: [phone]    Social Determinants of Health (SDOH) Interventions    Readmission Risk Interventions Readmission Risk Prevention Plan 05/14/2021  Transportation Screening Complete  Medication Review ([place] Complete  [place] or [place]  SW Recovery Care/Counseling Consult Complete  Palliative Care Screening Not [place] Applicable  Some recent data might be hidden

## 2021-09-08 ENCOUNTER — Telehealth: Payer: Self-pay | Admitting: Orthopedic Surgery

## 2021-09-08 NOTE — Telephone Encounter (Signed)
I spoke with pt, she is aware. She will f/u in office on Monday next week.

## 2021-09-08 NOTE — Telephone Encounter (Signed)
Pt called and stated she would like a call back if possible but pt stated [name] told her he would leave some wound vac canisters at the front for her to pick up. Pt said if she could get four if possible that would be great. The best call back number is [phone].

## 2021-09-08 NOTE — Telephone Encounter (Signed)
Can you please call pt and let her know that 2 canisters is all we have/ can do.

## 2021-09-09 ENCOUNTER — Telehealth: Payer: Self-pay

## 2021-09-09 NOTE — Telephone Encounter (Signed)
Patient would like a call back concerning her dressing on her right leg.  Stated that at the top of the dressing on her right leg looks like a skin abrasion.  Cb# [phone].  Please advise.  Thank you.

## 2021-09-09 NOTE — Telephone Encounter (Signed)
Holding for Amy Moses to follow up on Fri.  Tried calling patient. No answer. No VM to LM. Should patient call back ok to work patient in to have Dr [name] to address wound/abrasion.

## 2021-09-09 NOTE — Telephone Encounter (Signed)
Pt does have an appt with [facility] tomorrow 102/21/22

## 2021-09-10 ENCOUNTER — Encounter: Payer: Self-pay | Admitting: Family

## 2021-09-10 ENCOUNTER — Ambulatory Visit (INDEPENDENT_AMBULATORY_CARE_PROVIDER_SITE_OTHER): Payer: Medicare Other | Admitting: Family

## 2021-09-10 DIAGNOSIS — L97911 Non-pressure chronic ulcer of unspecified part of right lower leg limited to breakdown of skin: Secondary | ICD-10-CM

## 2021-09-10 DIAGNOSIS — L97923 Non-pressure chronic ulcer of unspecified part of left lower leg with necrosis of muscle: Secondary | ICD-10-CM

## 2021-09-10 NOTE — Telephone Encounter (Signed)
Will address at office visit today

## 2021-09-13 ENCOUNTER — Encounter: Payer: Medicare Other | Attending: Physician Assistant | Admitting: Physician Assistant

## 2021-09-13 ENCOUNTER — Encounter: Payer: Medicare Other | Admitting: Orthopedic Surgery

## 2021-09-13 ENCOUNTER — Ambulatory Visit: Payer: Medicare Other | Admitting: Physician Assistant

## 2021-09-13 ENCOUNTER — Other Ambulatory Visit: Payer: Self-pay

## 2021-09-13 DIAGNOSIS — L97128 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: Secondary | ICD-10-CM | POA: Diagnosis not present

## 2021-09-13 DIAGNOSIS — Z992 Dependence on renal dialysis: Secondary | ICD-10-CM | POA: Insufficient documentation

## 2021-09-13 DIAGNOSIS — L97118 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: Secondary | ICD-10-CM | POA: Insufficient documentation

## 2021-09-13 DIAGNOSIS — E1142 Type 2 diabetes mellitus with diabetic polyneuropathy: Secondary | ICD-10-CM | POA: Diagnosis not present

## 2021-09-13 DIAGNOSIS — E11622 Type 2 diabetes mellitus with other skin ulcer: Secondary | ICD-10-CM | POA: Insufficient documentation

## 2021-09-13 DIAGNOSIS — E1122 Type 2 diabetes mellitus with diabetic chronic kidney disease: Secondary | ICD-10-CM | POA: Insufficient documentation

## 2021-09-13 DIAGNOSIS — L89610 Pressure ulcer of right heel, unstageable: Secondary | ICD-10-CM | POA: Diagnosis not present

## 2021-09-13 DIAGNOSIS — L97812 Non-pressure chronic ulcer of other part of right lower leg with fat layer exposed: Secondary | ICD-10-CM | POA: Diagnosis not present

## 2021-09-13 DIAGNOSIS — L89323 Pressure ulcer of left buttock, stage 3: Secondary | ICD-10-CM | POA: Insufficient documentation

## 2021-09-13 DIAGNOSIS — N185 Chronic kidney disease, stage 5: Secondary | ICD-10-CM | POA: Insufficient documentation

## 2021-09-13 DIAGNOSIS — I120 Hypertensive chronic kidney disease with stage 5 chronic kidney disease or end stage renal disease: Secondary | ICD-10-CM | POA: Insufficient documentation

## 2021-09-13 DIAGNOSIS — L97822 Non-pressure chronic ulcer of other part of left lower leg with fat layer exposed: Secondary | ICD-10-CM | POA: Diagnosis not present

## 2021-09-13 DIAGNOSIS — L89313 Pressure ulcer of right buttock, stage 3: Secondary | ICD-10-CM | POA: Diagnosis not present

## 2021-09-13 NOTE — Progress Notes (Addendum)
KENNETTA, PAVLOVIC ([id number]) Visit Report for 09/13/2021 Arrival Information Details Patient Name: Amy Moses, Amy Moses Date of Service: 09/13/2021 9:30 AM Medical Record Number: [id number] Patient Account Number: [id number] Date of Birth/Sex: Apr 25, 1971 (50 y.o. F) Treating RN: Donnamarie Poag Primary Care Miaa Latterell: Wynelle Cleveland Other Clinician: Referring Rorey Hodges: Zenon Mayo Treating Lenor Provencher/Extender: Skipper Cliche in Treatment: 3 Visit Information History Since Last Visit Added or deleted any medications: Yes Patient Arrived: Wheel Chair Had a fall or experienced change in No Arrival Time: 09:16 activities of daily living that may affect Accompanied By: self risk of falls: Transfer Assistance: Transfer Board Hospitalized since last visit: No Patient Identification Verified: Yes Has Dressing in Place as Prescribed: Yes Secondary Verification Process Completed: Yes Pain Present Now: No Patient Requires Transmission-Based No Precautions: Patient Has Alerts: Yes Patient Alerts: Patient on Blood Thinner ASPIRIN Electronic Signature(s) Signed: 09/13/2021 11:52:03 AM By: Donnamarie Poag Entered By: Donnamarie Poag on 09/13/2021 09:17:15 Amy Moses ([id number]) -------------------------------------------------------------------------------- Clinic Level of Care Assessment Details Patient Name: Amy Moses Date of Service: 09/13/2021 9:30 AM Medical Record Number: [id number] Patient Account Number: [id number] Date of Birth/Sex: 02-12-1971 (50 y.o. F) Treating RN: Donnamarie Poag Primary Care Britteny Fiebelkorn: Wynelle Cleveland Other Clinician: Referring Nanna Ertle: Zenon Mayo Treating Rheana Casebolt/Extender: Skipper Cliche in Treatment: 37 Clinic Level of Care Assessment Items TOOL 4 Quantity Score []  - Use when only an EandM is performed on FOLLOW-UP visit 0 ASSESSMENTS - Nursing Assessment / Reassessment []  - Reassessment of Co-morbidities (includes updates in patient status)  0 []  - 0 Reassessment of Adherence to Treatment Plan ASSESSMENTS - Wound and Skin Assessment / Reassessment []  - Simple Wound Assessment / Reassessment - one wound 0 X- 2 5 Complex Wound Assessment / Reassessment - multiple wounds []  - 0 Dermatologic / Skin Assessment (not related to wound area) ASSESSMENTS - Focused Assessment []  - Circumferential Edema Measurements - multi extremities 0 []  - 0 Nutritional Assessment / Counseling / Intervention []  - 0 Lower Extremity Assessment (monofilament, tuning fork, pulses) []  - 0 Peripheral Arterial Disease Assessment (using hand held doppler) ASSESSMENTS - Ostomy and/or Continence Assessment and Care []  - Incontinence Assessment and Management 0 []  - 0 Ostomy Care Assessment and Management (repouching, etc.) PROCESS - Coordination of Care X - Simple Patient / Family Education for ongoing care 1 15 []  - 0 Complex (extensive) Patient / Family Education for ongoing care []  - 0 Staff obtains Programmer, systems, Records, Test Results / Process Orders X- 1 10 Staff telephones HHA, Nursing Homes / Clarify orders / etc []  - 0 Routine Transfer to another Facility (non-emergent condition) []  - 0 Routine Hospital Admission (non-emergent condition) []  - 0 New Admissions / Biomedical engineer / Ordering NPWT, Apligraf, etc. []  - 0 Emergency Hospital Admission (emergent condition) X- 1 10 Simple Discharge Coordination []  - 0 Complex (extensive) Discharge Coordination PROCESS - Special Needs []  - Pediatric / Minor Patient Management 0 []  - 0 Isolation Patient Management []  - 0 Hearing / Language / Visual special needs []  - 0 Assessment of Community assistance (transportation, D/C planning, etc.) []  - 0 Additional assistance / Altered mentation []  - 0 Support Surface(s) Assessment (bed, cushion, seat, etc.) INTERVENTIONS - Wound Cleansing / Measurement Strutz, Cullen ([id number]) []  - 0 Simple Wound Cleansing - one wound X- 2 5 Complex  Wound Cleansing - multiple wounds X- 1 5 Wound Imaging (photographs - any number of wounds) []  - 0 Wound Tracing (instead of photographs) []  - 0 Simple Wound Measurement - one wound X- 2 5 Complex  Wound Measurement - multiple wounds INTERVENTIONS - Wound Dressings []  - Small Wound Dressing one or multiple wounds 0 X- 2 15 Medium Wound Dressing one or multiple wounds []  - 0 Large Wound Dressing one or multiple wounds X- 1 5 Application of Medications - topical []  - 0 Application of Medications - injection INTERVENTIONS - Miscellaneous []  - External ear exam 0 []  - 0 Specimen Collection (cultures, biopsies, blood, body fluids, etc.) []  - 0 Specimen(s) / Culture(s) sent or taken to Lab for analysis []  - 0 Patient Transfer (multiple staff / Civil Service fast streamer / Similar devices) []  - 0 Simple Staple / Suture removal (25 or less) []  - 0 Complex Staple / Suture removal (26 or more) []  - 0 Hypo / Hyperglycemic Management (close monitor of Blood Glucose) []  - 0 Ankle / Brachial Index (ABI) - do not check if billed separately X- 1 5 Vital Signs Has the patient been seen at the hospital within the last three years: Yes Total Score: 110 Level Of Care: New/Established - Level 3 Electronic Signature(s) Signed: 09/13/2021 11:52:03 AM By: Donnamarie Poag Entered By: Donnamarie Poag on 09/13/2021 10:33:25 Amy Moses ([id number]) -------------------------------------------------------------------------------- Encounter Discharge Information Details Patient Name: Amy Moses Date of Service: 09/13/2021 9:30 AM Medical Record Number: [id number] Patient Account Number: [id number] Date of Birth/Sex: [birth year] (50 y.o. F) Treating RN: Donnamarie Poag Primary Care Kiana Hollar: Wynelle Cleveland Other Clinician: Referring Audrina Marten: Zenon Mayo Treating Allisha Harter/Extender: Skipper Cliche in Treatment: 108 Encounter Discharge Information Items Discharge Condition: Stable Ambulatory Status:  Wheelchair Discharge Destination: Home Transportation: Other Accompanied By: self Schedule Follow-up Appointment: Yes Clinical Summary of Care: Electronic Signature(s) Signed: 09/13/2021 4:25:25 PM By: Donnamarie Poag Entered By: Donnamarie Poag on 09/13/2021 14:46:42 Amy Moses ([id number]) -------------------------------------------------------------------------------- Lower Extremity Assessment Details Patient Name: Amy Moses Date of Service: 09/13/2021 9:30 AM Medical Record Number: [id number] Patient Account Number: [id number] Date of Birth/Sex: [birth year] (50 y.o. F) Treating RN: Donnamarie Poag Primary Care Rihanna Marseille: Wynelle Cleveland Other Clinician: Referring Celise Bazar: Zenon Mayo Treating Sigismund Cross/Extender: Skipper Cliche in Treatment: 110 Notes Dr. [name] is handling bilateral lower legs both observed in compression wrap therapy-stated skin grafts done by Dr. [name] since last visit Electronic Signature(s) Signed: 09/13/2021 11:52:03 AM By: Donnamarie Poag Entered By: Donnamarie Poag on 09/13/2021 09:34:45 Amy Moses ([id number]) -------------------------------------------------------------------------------- Multi Wound Chart Details Patient Name: Amy Moses Date of Service: 09/13/2021 9:30 AM Medical Record Number: [id number] Patient Account Number: [id number] Date of Birth/Sex: [birth year] (50 y.o. F) Treating RN: Donnamarie Poag Primary Care Kryslyn Helbig: Wynelle Cleveland Other Clinician: Referring Arianni Gallego: Zenon Mayo Treating Jetaun Colbath/Extender: Skipper Cliche in Treatment: 40 Vital Signs Height(in): 26 Pulse(bpm): 92 Weight(lbs): 235 Blood Pressure(mmHg): 163/98 Body Mass Index(BMI): 38 Temperature(F): 97.8 Respiratory Rate(breaths/min): 16 Photos: Wound Location: Left, Lateral, Posterior Upper Leg Left, Medial Upper Leg Left, Lateral Upper Leg Wounding Event: Shear/Friction Gradually Appeared Gradually Appeared Primary Etiology: Abrasion Calciphylaxis  Calciphylaxis Comorbid History: Cataracts, Glaucoma, Anemia, Cataracts, Glaucoma, Anemia, Cataracts, Glaucoma, Anemia, Hypertension, Type II Diabetes, End Hypertension, Type II Diabetes, End Hypertension, Type II Diabetes, End Stage Renal Disease, Neuropathy Stage Renal Disease, Neuropathy Stage Renal Disease, Neuropathy Date Acquired: 08/23/2021 07/22/2020 07/22/2020 Weeks of Treatment: 0 40 40 Wound Status: Open Open Healed - Epithelialized Clustered Wound: No No Yes Measurements L x W x D (cm) 0.4x1.3x0.1 0.4x0.3x0.1 0x0x0 Area (cm) : 0.408 0.094 0 Volume (cm) : 0.041 0.009 0 % Reduction in Area: N/A 99.90% 100.00% % Reduction in Volume: N/A 99.90% 100.00% Position 1 (o'clock): 3 Maximum Distance 1 (cm): 2.4  Tunneling: No Yes N/A Classification: Full Thickness Without Exposed Full Thickness Without Exposed Full Thickness Without Exposed Support Structures Support Structures Support Structures Exudate Amount: Medium Medium Medium Exudate Type: Serosanguineous Serosanguineous Sanguinous Exudate Color: red, brown red, brown red Granulation Amount: Medium (34-66%) Small (1-33%) None Present (0%) Granulation Quality: N/A Red, Pink N/A Necrotic Amount: Medium (34-66%) Large (67-100%) None Present (0%) Exposed Structures: Fat Layer (Subcutaneous Tissue): Fat Layer (Subcutaneous Tissue): Fascia: No Yes Yes Fat Layer (Subcutaneous Tissue): Fascia: No Fascia: No No Tendon: No Tendon: No Tendon: No Muscle: No Muscle: No Muscle: No Joint: No Joint: No Joint: No Bone: No Bone: No Bone: No Epithelialization: N/A None Large (67-100%) Treatment Notes Electronic Signature(s) Signed: 09/13/2021 11:52:03 AM By: Thera Flake, Vito Berger ([id number]) Entered By: Donnamarie Poag on 09/13/2021 10:28:09 Amy Moses ([id number]) -------------------------------------------------------------------------------- Ione Details Patient Name: Amy Moses Date of  Service: 09/13/2021 9:30 AM Medical Record Number: [id number] Patient Account Number: [id number] Date of Birth/Sex: 04/11/1971 (50 y.o. F) Treating RN: Donnamarie Poag Primary Care Albaraa Swingle: Wynelle Cleveland Other Clinician: Referring Sarvesh Meddaugh: Zenon Mayo Treating Delayni Streed/Extender: Skipper Cliche in Treatment: 40 Active Inactive Electronic Signature(s) Signed: 09/13/2021 11:52:03 AM By: Donnamarie Poag Entered By: Donnamarie Poag on 09/13/2021 10:27:58 Amy Moses ([id number]) -------------------------------------------------------------------------------- Pain Assessment Details Patient Name: Amy Moses Date of Service: 09/13/2021 9:30 AM Medical Record Number: [id number] Patient Account Number: [id number] Date of Birth/Sex: 09-Aug-1971 (50 y.o. F) Treating RN: Donnamarie Poag Primary Care Gian Ybarra: Wynelle Cleveland Other Clinician: Referring Valentina Alcoser: Zenon Mayo Treating Delta Pichon/Extender: Skipper Cliche in Treatment: 40 Active Problems Location of Pain Severity and Description of Pain Patient Has Paino No Site Locations Rate the pain. Current Pain Level: 0 Pain Management and Medication Current Pain Management: Electronic Signature(s) Signed: 09/13/2021 11:52:03 AM By: Donnamarie Poag Entered By: Donnamarie Poag on 09/13/2021 09:23:28 Amy Moses ([id number]) -------------------------------------------------------------------------------- Patient/Caregiver Education Details Patient Name: Amy Moses Date of Service: 09/13/2021 9:30 AM Medical Record Number: [id number] Patient Account Number: [id number] Date of Birth/Gender: 12-16-1970 (50 y.o. F) Treating RN: Donnamarie Poag Primary Care Physician: Wynelle Cleveland Other Clinician: Referring Physician: Zenon Mayo Treating Physician/Extender: Skipper Cliche in Treatment: 77 Education Assessment Education Provided To: Patient Education Topics Provided Basic Hygiene: Wound/Skin Impairment: Metallurgist) Signed: 09/13/2021 11:52:03 AM By: Donnamarie Poag Entered By: Donnamarie Poag on 09/13/2021 10:33:40 Amy Moses ([id number]) -------------------------------------------------------------------------------- Wound Assessment Details Patient Name: Amy Moses Date of Service: 09/13/2021 9:30 AM Medical Record Number: [id number] Patient Account Number: [id number] Date of Birth/Sex: 04/08/1971 (50 y.o. F) Treating RN: Donnamarie Poag Primary Care Galilee Pierron: Wynelle Cleveland Other Clinician: Referring Treavor Blomquist: Zenon Mayo Treating Beckham Buxbaum/Extender: Skipper Cliche in Treatment: 40 Wound Status Wound Number: 18 Primary Abrasion Etiology: Wound Location: Left, Lateral, Posterior Upper Leg Wound Open Wounding Event: Shear/Friction Status: Date Acquired: 08/23/2021 Notes: stated it started with shearing from tape removal Weeks Of Treatment: 0 Comorbid Cataracts, Glaucoma, Anemia, Hypertension, Type II Clustered Wound: No History: Diabetes, End Stage Renal Disease, Neuropathy Photos Wound Measurements Length: (cm) 0.4 Width: (cm) 1.3 Depth: (cm) 0.1 Area: (cm) 0.408 Volume: (cm) 0.041 % Reduction in Area: % Reduction in Volume: Tunneling: No Undermining: No Wound Description Classification: Full Thickness Without Exposed Support Structu Exudate Amount: Medium Exudate Type: Serosanguineous Exudate Color: red, brown res Foul Odor After Cleansing: No Slough/Fibrino Yes Wound Bed Granulation Amount: Medium (34-66%) Exposed Structure Necrotic Amount: Medium (34-66%) Fascia Exposed: No Necrotic Quality: Adherent Slough Fat Layer (Subcutaneous Tissue) Exposed: Yes Tendon Exposed: No Muscle Exposed: No Joint Exposed: No Bone Exposed:  No Treatment Notes Wound #18 (Upper Leg) Wound Laterality: Left, Lateral, Posterior Cleanser Soap and Water Discharge Instruction: Gently cleanse wound with antibacterial soap, rinse and pat dry prior to dressing  wounds Peri-Wound Care TAWONDA, LEGASPI ([id number]) Topical Primary Dressing Hydrofera Blue Classic Foam Rope Dressing, 9x6 (mm/in) Discharge Instruction: use in tunnel in medial thigh 2.4cm and leave tail out Secondary Dressing Zetuvit Plus Silicone Border Dressing 4x4 (in/in) Secured With Compression Wrap Compression Stockings Add-Ons Electronic Signature(s) Signed: 09/13/2021 11:52:03 AM By: Donnamarie Poag Entered By: Donnamarie Poag on 09/13/2021 09:32:05 Amy Moses ([id number]) -------------------------------------------------------------------------------- Wound Assessment Details Patient Name: Amy Moses Date of Service: 09/13/2021 9:30 AM Medical Record Number: [id number] Patient Account Number: [id number] Date of Birth/Sex: Jun 10, 1971 (50 y.o. F) Treating RN: Donnamarie Poag Primary Care Dijon Kohlman: Wynelle Cleveland Other Clinician: Referring Lewie Deman: Zenon Mayo Treating Aking Klabunde/Extender: Skipper Cliche in Treatment: 72 Wound Status Wound Number: 2 Primary Calciphylaxis Etiology: Wound Location: Left, Medial Upper Leg Wound Open Wounding Event: Gradually Appeared Status: Date Acquired: 07/22/2020 Comorbid Cataracts, Glaucoma, Anemia, Hypertension, Type II Weeks Of Treatment: 40 History: Diabetes, End Stage Renal Disease, Neuropathy Clustered Wound: No Photos Wound Measurements Length: (cm) 0.4 Width: (cm) 0.3 Depth: (cm) 0.1 Area: (cm) 0.094 Volume: (cm) 0.009 % Reduction in Area: 99.9% % Reduction in Volume: 99.9% Epithelialization: None Tunneling: Yes Position (o'clock): 3 Maximum Distance: (cm) 2.4 Undermining: No Wound Description Classification: Full Thickness Without Exposed Support Structu Exudate Amount: Medium Exudate Type: Serosanguineous Exudate Color: red, brown res Foul Odor After Cleansing: No Slough/Fibrino Yes Wound Bed Granulation Amount: Small (1-33%) Exposed Structure Granulation Quality: Red, Pink Fascia Exposed:  No Necrotic Amount: Large (67-100%) Fat Layer (Subcutaneous Tissue) Exposed: Yes Necrotic Quality: Adherent Slough Tendon Exposed: No Muscle Exposed: No Joint Exposed: No Bone Exposed: No Treatment Notes Wound #2 (Upper Leg) Wound Laterality: Left, Medial Cleanser Soap and Water UYEN, EICHHOLZ ([id number]) Discharge Instruction: Gently cleanse wound with antibacterial soap, rinse and pat dry prior to dressing wounds Peri-Wound Care Topical Primary Dressing Hydrofera Blue Classic Foam Rope Dressing, 9x6 (mm/in) Discharge Instruction: moisten with saline apply to wounds-cut to fit Secondary Dressing Zetuvit Plus Silicone Border Dressing 4x4 (in/in) Secured With Compression Wrap Compression Stockings Add-Ons Electronic Signature(s) Signed: 09/13/2021 11:52:03 AM By: Donnamarie Poag Entered By: Donnamarie Poag on 09/13/2021 10:27:01 Amy Moses ([id number]) -------------------------------------------------------------------------------- Wound Assessment Details Patient Name: Amy Moses Date of Service: 09/13/2021 9:30 AM Medical Record Number: [id number] Patient Account Number: [id number] Date of Birth/Sex: [birth year] (50 y.o. F) Treating RN: Donnamarie Poag Primary Care Leahanna Buser: Wynelle Cleveland Other Clinician: Referring Izea Livolsi: Zenon Mayo Treating Danniell Rotundo/Extender: Skipper Cliche in Treatment: 40 Wound Status Wound Number: 3 Primary Calciphylaxis Etiology: Wound Location: Left, Lateral Upper Leg Wound Healed - Epithelialized Wounding Event: Gradually Appeared Status: Date Acquired: 07/22/2020 Comorbid Cataracts, Glaucoma, Anemia, Hypertension, Type II Weeks Of Treatment: 40 History: Diabetes, End Stage Renal Disease, Neuropathy Clustered Wound: Yes Photos Wound Measurements Length: (cm) Width: (cm) Depth: (cm) Area: (cm) Volume: (cm) 0 % Reduction in Area: 100% 0 % Reduction in Volume: 100% 0 Epithelialization: Large (67-100%) 0 0 Wound  Description Classification: Full Thickness Without Exposed Support Structu Exudate Amount: Medium Exudate Type: Sanguinous Exudate Color: red res Foul Odor After Cleansing: No Slough/Fibrino No Wound Bed Granulation Amount: None Present (0%) Exposed Structure Necrotic Amount: None Present (0%) Fascia Exposed: No Fat Layer (Subcutaneous Tissue) Exposed: No Tendon Exposed: No Muscle Exposed: No Joint Exposed: No Bone Exposed: No Electronic Signature(s) Signed: 09/13/2021 11:52:03 AM By: Donnamarie Poag Entered By: Donnamarie Poag  on 09/13/2021 09:29:19 EMILYNN, SRINIVASAN ([id number]) -------------------------------------------------------------------------------- Vitals Details Patient Name: Amy Moses Date of Service: 09/13/2021 9:30 AM Medical Record Number: [id number] Patient Account Number: [id number] Date of Birth/Sex: 07/12/1971 (50 y.o. F) Treating RN: Donnamarie Poag Primary Care Raydan Schlabach: Wynelle Cleveland Other Clinician: Referring Almon Whitford: Zenon Mayo Treating Greidy Sherard/Extender: Skipper Cliche in Treatment: 40 Vital Signs Time Taken: 09:21 Temperature (F): 97.8 Height (in): 66 Pulse (bpm): 76 Weight (lbs): 235 Respiratory Rate (breaths/min): 16 Body Mass Index (BMI): 37.9 Blood Pressure (mmHg): 163/98 Reference Range: 80 - 120 mg / dl Notes stated nephrology following BP changes and meds Electronic Signature(s) Signed: 09/13/2021 11:52:03 AM By: Donnamarie Poag Entered ByDonnamarie Poag on 09/13/2021 09:22:21

## 2021-09-13 NOTE — Progress Notes (Addendum)
MARGEART, ALLENDER ([id number]) Visit Report for 09/13/2021 Chief Complaint Document Details Patient Name: Amy Moses, Amy Moses Date of Service: 09/13/2021 9:30 AM Medical Record Number: [id number] Patient Account Number: [id number] Date of Birth/Sex: 11-30-1970 (50 y.o. F) Treating RN: Donnamarie Poag Primary Care Provider: Wynelle Cleveland Other Clinician: Referring Provider: Zenon Mayo Treating Provider/Extender: Skipper Cliche in Treatment: 40 Information Obtained from: Patient Chief Complaint 12/02/2020; patient is here for review of extensive wounds on her bilateral thighs as well as an area on her right plantar heel Electronic Signature(s) Signed: 09/13/2021 9:19:18 AM By: Worthy Keeler PA-C Entered By: Worthy Keeler on 09/13/2021 09:19:18 Amy Moses ([id number]) -------------------------------------------------------------------------------- HPI Details Patient Name: Amy Moses Date of Service: 09/13/2021 9:30 AM Medical Record Number: [id number] Patient Account Number: [id number] Date of Birth/Sex: [birth year] (50 y.o. F) Treating RN: Donnamarie Poag Primary Care Provider: Wynelle Cleveland Other Clinician: Referring Provider: Zenon Mayo Treating Provider/Extender: Skipper Cliche in Treatment: 35 History of Present Illness HPI Description: ADMISSION 12/02/2020 This is a 50 year old woman who is a type II diabetic. Although there are mentions of type 1 diabetes in epic clearly this woman is type II based on the fact that she was on oral agents for 10 years before starting insulin. She also is in chronic renal failure and recently started on dialysis I think in November. She relates her problems starting in September she started to develop skin excoriation on her bilateral inner thighs. She thought this was a friction phenomenon however the tissue wrist can progressively broke down. She was seen by her primary doctor on 10/21/2020 noted to have erythema on both thighs  medially and a blister. MRIs were ordered and she was put on Silvadene and gauze. She was seen in the ER on 12/12 at the [place] [facility] also noted to have a right unstageable heel wound. 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 She dialyzes at [facility] in [name] and she was started on sodium thiosulfate as far as the notes state. The patient states she gets something at dialysis every day although she has not exactly sure what they are giving her. She was noted by vein and vascular in [place] to have multiple open wounds on 11/19/2020 using Xeroform gauze. She had an angiogram booked by Dr. Oneida Alar predominantly I think because of the right heel ulcer although this was canceled by the patient because of the weather. The patient has large necrotic wounds on both inner thighs with covering black eschar. There is also an area on the left lateral thigh and an eschared area on her right plantar heel. She has been using Betadine to the right heel Xeroform to the areas on her legs. The patient has Medicaid but is able to get the Xeroform, were not really sure how she is managing this although she lives in [place] and there may be different rules for Medicaid in [place] versus [place]. We certainly would not be able to get that here. 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 I wondered whether her neuropathy is particularly affected her sensation of pain since her recent left patella fracture does not seem to have been that painful either Past medical history includes stage V chronic renal failure starting on dialysis I think in November, clearly type 2 diabetes with neuropathy, hypertension, glaucoma, vitamin D deficiency, history of cholecystectomy, edema of both legs, hypothyroidism, she dialyzes Tuesday  Thursday and Saturday at [facility] in  [place] The patient had arterial studies on 11/19/2020 I think at vein and vascular in [place]. She had biphasic waveforms throughout the thigh on the right and at the popliteal proximally and distally in the ATA but the PTA and peroneal were not visualized. On the left again biphasic waveforms up to the level of the distal popliteal but not visualized in the tibial vessels. She was felt to have adequate flow where visualized. As noted she was supposed to have an angiogram which I think is certainly indicated 01/20/9517 upon evaluation today patient actually appears to be doing okay in regard to her wounds. This is actually the first time of seeing her she saw Dr. Dellia Nims at the last visit she does have quite an extensive history based on review. With that being said I do not see any signs right now of active infection which is great news. Overall I am extremely pleased with where things stand in that regard. 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 I really feel like she may do better with Dakin's moistened gauze. I do think that we can see about ordering the Dakin's for her she is already using gauze and then wrapping with roll gauze anyway so really would not change much except for moistening some gauze and applying it to the wound bed. With that being said I do not see any signs of active infection at this time which is great news. The patient all in all seems to be in fairly good spirits all things considered. 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 Fortunately it looks like some of the eschar is loosening up so we should be able to get this removed which hopefully will help in speeding up the healing process. With that being said with regard to the patient's  gluteal region she unfortunately has new pressure ulcerations open today which I think could benefit from possibly having a air mattress. With that being said I explained to the patient that unfortunately this could still take some time as far as getting it to heal completely. There does not appear to be any signs of active infection at this time which is great news. No fevers, chills, nausea, vomiting, or diarrhea. 02/16/2021 upon evaluation today patient appears to be doing about the same in regard to her heel. Her gluteal area is actually getting better. She did have surgery in regard to her thighs bilaterally as well as her knee. It was not until she got into the operating room that it was noted that she had the wounds on the thighs. Subsequently according to the surgeons note dated 02/01/2021 they contacted the patient's daughter in order to discuss the fact they felt she needed to have the wound surgically debrided and a wound VAC placed and subsequently instead of patellar reconstruction they opted to proceed with removal of the Portion of the patella in order to get this area to heal more effectively and quickly. Overall that seems to have done quite well. 02/26/2021 upon evaluation today patient's wounds actually appear to be doing excellent pretty much across the board I am very pleased. With that being said the wounds where she has the wound vacs placed on the thighs in particular seems to be doing a very  good job as far as healing is concerned. There does not appear to be any evidence of infection which is great news and overall I am extremely pleased in that regard. No fevers, chills, nausea, vomiting, or diarrhea. 03/12/21 upon evaluation today patient appears to be doing well with regard to her wounds. I think she is doing excellent with the wound VAC and very pleased in that regard. Fortunately there does not appear to be any signs of infection I am that a try to do a little bit of light  debridement today due to some necrotic regions noted in her wounds to try to help speed up the healing process here. 04/05/2021 upon evaluation today patient appears to be doing decently well in general in regard to her wounds. She was unfortunately in the hospital from the 10th through the 12th of this month due to having high fever and subsequently they did not actually determine exactly what was going on they thought she might of just been sick or had some kind of a cold. With that being said fortunately there does not appear to be any evidence of active infection at this time which is great news. No fevers, chills, nausea, vomiting, or diarrhea. CAYLOR, CERINO ([id number]) 05/03/2021 upon evaluation today patient actually appears to be doing excellent for the most part in regard to the wounds on her thighs. I am extremely happy with what I see today. In fact I feel like it is probably time for [name] to discontinue the use of the wound VAC as this appears to be doing so well. The patient voiced an understanding and agreement and is definitely happy to get rid of the wound VAC. She was recently in the hospital but this was secondary to something unrelated to her wound she had a polyp which was bleeding and had to be taken care of this was causing significant issues she has since been taken off of her aspirin. 05/21/2021 upon evaluation today patient appears to be doing well with regard to her wound. She has been tolerating the dressing changes without complication with regard to the [facility] in the upper thigh region. We have been using Dakin's moistened gauze on the legs and the heel. Fortunately everything seems to be making good progress here in general which is great news. The unfortunate thing is that we have been having issues here with some necrotic tissue and drainage on the lateral portions of her legs. Fortunately there does not appear to be any signs of systemic infection although  locally there may be a little bit of infection here based on the drainage which is somewhat blue-green in nature. I am going to perform a culture postdebridement of the right calf/lower extremity region. She tolerated that today without complication. I did perform that culture as well. 06/04/2021 upon evaluation today patient appears to be doing well with regard to the wounds on her thighs in general. Fortunately there does not appear to be any signs of active infection which is great news I am very pleased in that regard. In regard to her lower extremities I do feel like that she is making some good progress here as well. This includes the use of the Dakin's moistened gauze which has done a great job as far as keeping the wounds clean at this point. Overall I am extremely happy with where we stand. 06/18/2021 upon evaluation today patient appears to be doing a little better in regard to most of her wounds in general. She  does have a couple areas again and needs some debridement as far as the left lateral leg and the right heel are concerned. Otherwise she also has a small area of fluid on the right medial thigh where this is mostly healed but nonetheless there is a small opening that is of concern here. Fortunately there is no evidence of active infection at this time. No fevers, chills, nausea, vomiting, or diarrhea. 07/08/2021 upon evaluation today patient appears to be doing quite well in regard to her wounds on the right thigh region and left medial thigh region. That is about the extent of what seems to be doing good though everything else is at least the same if not a little bit worse unfortunately. Currently that includes the right lateral thigh where she has pus draining from this area we are probably need to culture this region. There is also some depth to the area where this is draining from. She also has significant wounds on the calf region right and left of the left especially starting to  migrate and looking like it is getting worse. We been using Dakin's on this area. Her heel seems to be doing about the same there is really no significant improvement in general. 07/23/2021 upon evaluation today patient actually appears to be doing quite well in regard to her wounds in general. Most everything is actually showing signs of significant improvement. She did see plastic surgery, Dr. Claudia Desanctis yesterday. With that being said it appears based on what the patient is telling me that he really did not feel there was anything from a surgical standpoint that he had to offer as far as treatment was concerned. Again this was part of the reason that I sent her in order to determine whether or not there was anything that could be done or not. With that being said he did mention making a [facility] in [facility] to see if there is anything they had to offer but in general it sounds like that is probably not going to be the case. With all that being said the one area that we have an issue is on the left lateral thigh where unfortunately she is having a significant amount of purulent drainage there still some depth here I think we need to pack this area Hydrofera Blue rope will probably do well for her. 08/06/2021 upon evaluation today patient appears to be doing poorly in regard to her left leg in general. Unfortunately I think that she is going require light sharp debridement of the gluteal area but I think that this really is not doing too badly at all. With regard to the left leg however this is significantly worse than what I previously noted. I do believe that in fact the calf region is a lot deeper even than 2 weeks ago quite significantly and subsequently also think that she has a significant infection in the upper part of the thigh on the lateral/hip location where we noted an abscess before I did do a deep wound culture revealed multiple organisms nothing predominant. Subsequently  coupled with the fact that she is having a lot of issues with pain in this area as well and still having purulent pus despite the doxycycline I think that it really has not treated the situation she probably has a deeper abscess that needs to be further managed more aggressively than what I can do in the outpatient setting. 08/20/2021 upon evaluation today the patient actually appears to be doing  much better in regard to her left upper thigh ulceration. In regard to her legs were actually not can be taken care of those at this point as she seen Dr. Pearla Dubonnet and he is managing that currently. The good news is I think that she is doing awesome from the standpoint of the upper leg and in fact has been told by Dr. [name] that her legs lower and the heels are also doing great with good granulation tissue. Obviously this is great news and hopefully the stent in the hospital that she went for has helped tremendously with that as well as have been very concerned about her legs as well to be perfectly honest. Obviously I did not have a chance to view them today and everything in the gluteal area has completely healed. 09/13/2021 upon evaluation today patient appears to be doing well with regard to her lateral upper thigh wound. She has been tolerating the dressing changes without complication. Fortunately there is no signs of active infection at this time. With that being said on one of the medial wounds on the left thigh as well that she has been doing quite well but she has a small area that opened and started draining. With that being said this seems to be not too deep although there was a tunnel towards 4:00 noted when I did clean this a little bit more aggressively. I think that is why this open. Nonetheless I think a small amount of Hydrofera Blue rope would be beneficial as far as helping with allowing this heal from the bottom out. Electronic Signature(s) Signed: 09/13/2021 10:45:09 AM By: Worthy Keeler  PA-C Entered By: Worthy Keeler on 09/13/2021 10:45:09 Amy Moses ([id number]) -------------------------------------------------------------------------------- Physical Exam Details Patient Name: Amy Moses Date of Service: 09/13/2021 9:30 AM Medical Record Number: [id number] Patient Account Number: [id number] Date of Birth/Sex: 1971/06/17 (50 y.o. F) Treating RN: Donnamarie Poag Primary Care Provider: Wynelle Cleveland Other Clinician: Referring Provider: Zenon Mayo Treating Provider/Extender: Skipper Cliche in Treatment: 40 Constitutional Chronically ill appearing but in no apparent acute distress. Respiratory normal breathing without difficulty. Psychiatric this patient is able to make decisions and demonstrates good insight into disease process. Alert and Oriented x 3. pleasant and cooperative. Notes Upon inspection patient's wound bed actually showed signs of good granulation and epithelization at this point. Fortunately there does not appear to be any evidence of active infection systemically which is great news and overall very pleased with where things stand today. No fevers, chills, nausea, vomiting, or diarrhea. No sharp debridement was necessary today. In regard to the lower leg ulcerations she is actually having skin grafts/skin substitute performed by Dr. [name] at this time and tells me that things seem to be going well. I have not seen those wounds in some time. Electronic Signature(s) Signed: 09/13/2021 10:45:49 AM By: Worthy Keeler PA-C Entered By: Worthy Keeler on 09/13/2021 10:45:48 Amy Moses ([id number]) -------------------------------------------------------------------------------- Physician Orders Details Patient Name: Amy Moses Date of Service: 09/13/2021 9:30 AM Medical Record Number: [id number] Patient Account Number: [id number] Date of Birth/Sex: 1970/12/30 (50 y.o. F) Treating RN: Donnamarie Poag Primary Care Provider: Wynelle Cleveland Other Clinician: Referring Provider: Zenon Mayo Treating Provider/Extender: Skipper Cliche in Treatment: 62 Verbal / Phone Orders: No Diagnosis Coding ICD-10 Coding Code Description E83.59 Other disorders of calcium metabolism L97.118 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 L97.128 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 L89.610 Pressure ulcer of right heel, unstageable  E11.42 Type 2 diabetes mellitus with diabetic polyneuropathy L89.313 Pressure ulcer of right buttock, stage 3 L89.323 Pressure ulcer of left buttock, stage 3 L97.822 Non-pressure chronic ulcer of other part of left lower leg with fat layer exposed L97.812 Non-pressure chronic ulcer of other part of right lower leg with fat layer exposed Follow-up Appointments o Return Appointment in 2 weeks. Kay: - Continue with [facility] for wound care [place], [place]) [place] [phone] Bilateral lower legs handled by Dr. Mitzie Na Care-contact Ortho care for orders for bilateral lower legs HH initiated from hospital d/c o [place] for wound care. May utilize formulary equivalent dressing for wound treatment orders unless otherwise specified. Home Health Nurse may visit PRN to address patientos wound care needs. o **Please direct any NON-WOUND related issues/requests for orders to patient's Primary Care Physician. **If current dressing causes regression in wound condition, may D/C ordered dressing product/s and apply Normal Saline Moist Dressing daily until next [facility] or Other MD appointment. **Notify [facility]aling Center of regression in wound condition at [phone]. Edema Control - Lymphedema / Segmental Compressive Device / Other o Elevate leg(s) parallel to the floor when sitting. o DO YOUR BEST to sleep in the bed at night. DO NOT sleep in your recliner. Long hours of sitting in a  recliner leads to swelling of the legs and/or potential wounds on your backside. Off-Loading o Gel wheelchair cushion o Air fluidized (Group 3) - stay in the bed except for meals. o Turn and reposition every 2 hours o Other: - keep pressure off of wounded areas. Wound Treatment Wound #18 - Upper Leg Wound Laterality: Left, Lateral, Posterior Cleanser: Soap and Water (Home Health) 3 x Per Week/15 Days Discharge Instructions: Gently cleanse wound with antibacterial soap, rinse and pat dry prior to dressing wounds Primary Dressing: Hydrofera Blue Classic Foam Rope Dressing, 9x6 (mm/in) 3 x Per Week/15 Days Discharge Instructions: use in tunnel in medial thigh 2.4cm and leave tail out Secondary Dressing: Zetuvit Plus Silicone Border Dressing 4x4 (in/in) 3 x Per Week/15 Days Wound #2 - Upper Leg Wound Laterality: Left, Medial Cleanser: Soap and Water (Home Health) 3 x Per Week/15 Days Discharge Instructions: Gently cleanse wound with antibacterial soap, rinse and pat dry prior to dressing wounds Primary Dressing: Hydrofera Blue Classic Foam Rope Dressing, 9x6 (mm/in) 3 x Per Week/15 Days Discharge Instructions: moisten with saline apply to wounds-cut to fit YASHIRA, OFFENBERGER ([id number]) Secondary Dressing: Zetuvit Plus Silicone Border Dressing 4x4 (in/in) 3 x Per Week/15 Days Notes Apply Eucerin to dry skin areas; continue to follow with Dr. [name] for bilateral lower legs and wounds with orders Electronic Signature(s) Signed: 09/15/2021 4:08:36 PM By: Donnamarie Poag Signed: 09/15/2021 4:54:49 PM By: Worthy Keeler PA-C Previous Signature: 09/13/2021 11:52:03 AM Version By: Donnamarie Poag Previous Signature: 09/13/2021 4:31:19 PM Version By: Worthy Keeler PA-C Entered By: Donnamarie Poag on 09/15/2021 16:08:06 Amy Moses ([id number]) -------------------------------------------------------------------------------- Problem List Details Patient Name: Amy Moses Date of Service:  09/13/2021 9:30 AM Medical Record Number: [id number] Patient Account Number: [id number] Date of Birth/Sex: 1971/06/28 (50 y.o. F) Treating RN: Donnamarie Poag Primary Care Provider: Wynelle Cleveland Other Clinician: Referring Provider: Zenon Mayo Treating Provider/Extender: Skipper Cliche in Treatment: 34 Active Problems ICD-10 Encounter Code Description Active Date MDM Diagnosis E83.59 Other disorders of calcium metabolism 12/02/2020 No Yes L97.118 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 12/02/2020 No Yes L97.128 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  12/02/2020 No Yes L89.610 Pressure ulcer of right heel, unstageable 12/02/2020 No Yes E11.42 Type 2 diabetes mellitus with diabetic polyneuropathy 12/02/2020 No Yes L89.313 Pressure ulcer of right buttock, stage 3 01/19/2021 No Yes L89.323 Pressure ulcer of left buttock, stage 3 01/19/2021 No Yes L97.822 Non-pressure chronic ulcer of other part of left lower leg with fat layer 05/03/2021 No Yes exposed L97.812 Non-pressure chronic ulcer of other part of right lower leg with fat layer 05/03/2021 No Yes exposed Inactive Problems Resolved Problems Electronic Signature(s) Signed: 09/13/2021 9:18:42 AM By: Worthy Keeler PA-C Entered By: Worthy Keeler on 09/13/2021 09:18:42 Amy Moses ([id number]) -------------------------------------------------------------------------------- Progress Note Details Patient Name: Amy Moses Date of Service: 09/13/2021 9:30 AM Medical Record Number: [id number] Patient Account Number: [id number] Date of Birth/Sex: 19-Apr-1971 (50 y.o. F) Treating RN: Donnamarie Poag Primary Care Provider: Wynelle Cleveland Other Clinician: Referring Provider: Zenon Mayo Treating Provider/Extender: Skipper Cliche in Treatment: 7 Subjective Chief Complaint Information obtained from Patient 12/02/2020; patient is here for review of extensive wounds on  her bilateral thighs as well as an area on her right plantar heel History of Present Illness (HPI) ADMISSION 12/02/2020 This is a 50 year old woman who is a type II diabetic. Although there are mentions of type 1 diabetes in epic clearly this woman is type II based on the fact that she was on oral agents for 10 years before starting insulin. She also is in chronic renal failure and recently started on dialysis I think in November. She relates her problems starting in September she started to develop skin excoriation on her bilateral inner thighs. She thought this was a friction phenomenon however the tissue wrist can progressively broke down. She was seen by her primary doctor on 10/21/2020 noted to have erythema on both thighs medially and a blister. MRIs were ordered and she was put on Silvadene and gauze. She was seen in the ER on 12/12 at the [place] clinic also noted to have a right unstageable heel wound. 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 She dialyzes at [place] in [facility] and she was started on sodium thiosulfate as far as the notes state. The patient states she gets something at dialysis every day although she has not exactly sure what they are giving her. She was noted by vein and vascular in [place] to have multiple open wounds on 11/19/2020 using Xeroform gauze. She had an angiogram booked by Dr. Oneida Alar predominantly I think because of the right heel ulcer although this was canceled by the patient because of the weather. The patient has large necrotic wounds on both inner thighs with covering black eschar. There is also an area on the left lateral thigh and an eschared area on her right plantar heel. She has been using Betadine to the right heel Xeroform to the areas on her legs. The patient has Medicaid but is able to get the Xeroform, were not really sure how she is managing this although she lives in [place] and  there may be different rules for Medicaid in [place] versus [place]. We certainly would not be able to get that here. 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 I wondered whether her neuropathy is particularly affected her sensation of pain since her recent left patella fracture does not seem to have been that painful either Past medical history includes stage V chronic renal failure  starting on dialysis I think in November, clearly type 2 diabetes with neuropathy, hypertension, glaucoma, vitamin D deficiency, history of cholecystectomy, edema of both legs, hypothyroidism, she dialyzes Tuesday Thursday and Saturday at [facility] in [place] The patient had arterial studies on 11/19/2020 I think at vein and vascular in [place]. She had biphasic waveforms throughout the thigh on the right and at the popliteal proximally and distally in the ATA but the PTA and peroneal were not visualized. On the left again biphasic waveforms up to the level of the distal popliteal but not visualized in the tibial vessels. She was felt to have adequate flow where visualized. As noted she was supposed to have an angiogram which I think is certainly indicated 06/29/8915 upon evaluation today patient actually appears to be doing okay in regard to her wounds. This is actually the first time of seeing her she saw Dr. Dellia Nims at the last visit she does have quite an extensive history based on review. With that being said I do not see any signs right now of active infection which is great news. Overall I am extremely pleased with where things stand in that regard. 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 I really feel like she may do better with Dakin's moistened gauze. I do think that we can see about ordering the Dakin's for her she  is already using gauze and then wrapping with roll gauze anyway so really would not change much except for moistening some gauze and applying it to the wound bed. With that being said I do not see any signs of active infection at this time which is great news. The patient all in all seems to be in fairly good spirits all things considered. 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 Fortunately it looks like some of the eschar is loosening up so we should be able to get this removed which hopefully will help in speeding up the healing process. With that being said with regard to the patient's gluteal region she unfortunately has new pressure ulcerations open today which I think could benefit from possibly having a air mattress. With that being said I explained to the patient that unfortunately this could still take some time as far as getting it to heal completely. There does not appear to be any signs of active infection at this time which is great news. No fevers, chills, nausea, vomiting, or diarrhea. 02/16/2021 upon evaluation today patient appears to be doing about the same in regard to her heel. Her gluteal area is actually getting better. She did have surgery in regard to her thighs bilaterally as well as her knee. It was not until she got into the operating room that it was noted that she had the wounds on the thighs. Subsequently according to the surgeons note dated 02/01/2021 they contacted the patient's daughter in order to discuss the fact they felt she needed to have the wound surgically debrided and a wound VAC placed and subsequently instead of patellar reconstruction they opted to proceed with removal of the Portion of the patella in order to get this area to heal more effectively and quickly. Overall that seems to have done quite well. 02/26/2021 upon evaluation  today patient's wounds actually appear to be doing excellent pretty much across the  board I am very pleased. With that being said the wounds where she has the wound vacs placed on the thighs in particular seems to be doing a very good job as far as healing is concerned. There does not appear to be any evidence of infection which is great news and overall I am extremely pleased in that regard. No fevers, chills, nausea, vomiting, or diarrhea. 03/12/21 upon evaluation today patient appears to be doing well with regard to her wounds. I think she is doing excellent with the wound VAC and very pleased in that regard. Fortunately there does not appear to be any signs of infection I am that a try to do a little bit of light debridement today due to some necrotic regions noted in her wounds to try to help speed up the healing process here. MAYSON, STERBENZ ([id number]) 04/05/2021 upon evaluation today patient appears to be doing decently well in general in regard to her wounds. She was unfortunately in the hospital from the 10th through the 12th of this month due to having high fever and subsequently they did not actually determine exactly what was going on they thought she might of just been sick or had some kind of a cold. With that being said fortunately there does not appear to be any evidence of active infection at this time which is great news. No fevers, chills, nausea, vomiting, or diarrhea. 05/03/2021 upon evaluation today patient actually appears to be doing excellent for the most part in regard to the wounds on her thighs. I am extremely happy with what I see today. In fact I feel like it is probably time for [name] to discontinue the use of the wound VAC as this appears to be doing so well. The patient voiced an understanding and agreement and is definitely happy to get rid of the wound VAC. She was recently in the hospital but this was secondary to something unrelated to her wound she had a polyp which  was bleeding and had to be taken care of this was causing significant issues she has since been taken off of her aspirin. 05/21/2021 upon evaluation today patient appears to be doing well with regard to her wound. She has been tolerating the dressing changes without complication with regard to the [facility] in the upper thigh region. We have been using Dakin's moistened gauze on the legs and the heel. Fortunately everything seems to be making good progress here in general which is great news. The unfortunate thing is that we have been having issues here with some necrotic tissue and drainage on the lateral portions of her legs. Fortunately there does not appear to be any signs of systemic infection although locally there may be a little bit of infection here based on the drainage which is somewhat blue-green in nature. I am going to perform a culture postdebridement of the right calf/lower extremity region. She tolerated that today without complication. I did perform that culture as well. 06/04/2021 upon evaluation today patient appears to be doing well with regard to the wounds on her thighs in general. Fortunately there does not appear to be any signs of active infection which is great news I am very pleased in that regard. In regard to her lower extremities I do feel like that she is making some good progress here as well. This includes the use of the Dakin's moistened gauze which has done a great job as far as keeping the wounds clean at this point. Overall I am  extremely happy with where we stand. 06/18/2021 upon evaluation today patient appears to be doing a little better in regard to most of her wounds in general. She does have a couple areas again and needs some debridement as far as the left lateral leg and the right heel are concerned. Otherwise she also has a small area of fluid on the right medial thigh where this is mostly healed but nonetheless there is a small opening that is of  concern here. Fortunately there is no evidence of active infection at this time. No fevers, chills, nausea, vomiting, or diarrhea. 07/08/2021 upon evaluation today patient appears to be doing quite well in regard to her wounds on the right thigh region and left medial thigh region. That is about the extent of what seems to be doing good though everything else is at least the same if not a little bit worse unfortunately. Currently that includes the right lateral thigh where she has pus draining from this area we are probably need to culture this region. There is also some depth to the area where this is draining from. She also has significant wounds on the calf region right and left of the left especially starting to migrate and looking like it is getting worse. We been using Dakin's on this area. Her heel seems to be doing about the same there is really no significant improvement in general. 07/23/2021 upon evaluation today patient actually appears to be doing quite well in regard to her wounds in general. Most everything is actually showing signs of significant improvement. She did see plastic surgery, Dr. Claudia Desanctis yesterday. With that being said it appears based on what the patient is telling me that he really did not feel there was anything from a surgical standpoint that he had to offer as far as treatment was concerned. Again this was part of the reason that I sent her in order to determine whether or not there was anything that could be done or not. With that being said he did mention making a [facility] in [facility] to see if there is anything they had to offer but in general it sounds like that is probably not going to be the case. With all that being said the one area that we have an issue is on the left lateral thigh where unfortunately she is having a significant amount of purulent drainage there still some depth here I think we need to pack this area Hydrofera Blue rope will  probably do well for her. 08/06/2021 upon evaluation today patient appears to be doing poorly in regard to her left leg in general. Unfortunately I think that she is going require light sharp debridement of the gluteal area but I think that this really is not doing too badly at all. With regard to the left leg however this is significantly worse than what I previously noted. I do believe that in fact the calf region is a lot deeper even than 2 weeks ago quite significantly and subsequently also think that she has a significant infection in the upper part of the thigh on the lateral/hip location where we noted an abscess before I did do a deep wound culture revealed multiple organisms nothing predominant. Subsequently coupled with the fact that she is having a lot of issues with pain in this area as well and still having purulent pus despite the doxycycline I think that it really has not treated the situation she probably has a deeper abscess  that needs to be further managed more aggressively than what I can do in the outpatient setting. 08/20/2021 upon evaluation today the patient actually appears to be doing much better in regard to her left upper thigh ulceration. In regard to her legs were actually not can be taken care of those at this point as she seen Dr. Pearla Dubonnet and he is managing that currently. The good news is I think that she is doing awesome from the standpoint of the upper leg and in fact has been told by Dr. [name] that her legs lower and the heels are also doing great with good granulation tissue. Obviously this is great news and hopefully the stent in the hospital that she went for has helped tremendously with that as well as have been very concerned about her legs as well to be perfectly honest. Obviously I did not have a chance to view them today and everything in the gluteal area has completely healed. 09/13/2021 upon evaluation today patient appears to be doing well with regard to her  lateral upper thigh wound. She has been tolerating the dressing changes without complication. Fortunately there is no signs of active infection at this time. With that being said on one of the medial wounds on the left thigh as well that she has been doing quite well but she has a small area that opened and started draining. With that being said this seems to be not too deep although there was a tunnel towards 4:00 noted when I did clean this a little bit more aggressively. I think that is why this open. Nonetheless I think a small amount of Hydrofera Blue rope would be beneficial as far as helping with allowing this heal from the bottom out. Objective CHRISSY, EALEY ([id number]) Constitutional Chronically ill appearing but in no apparent acute distress. Vitals Time Taken: 9:21 AM, Height: 66 in, Weight: 235 lbs, BMI: 37.9, Temperature: 97.8 F, Pulse: 76 bpm, Respiratory Rate: 16 breaths/min, Blood Pressure: 163/98 mmHg. General Notes: stated nephrology following BP changes and meds Respiratory normal breathing without difficulty. Psychiatric this patient is able to make decisions and demonstrates good insight into disease process. Alert and Oriented x 3. pleasant and cooperative. General Notes: Upon inspection patient's wound bed actually showed signs of good granulation and epithelization at this point. Fortunately there does not appear to be any evidence of active infection systemically which is great news and overall very pleased with where things stand today. No fevers, chills, nausea, vomiting, or diarrhea. No sharp debridement was necessary today. In regard to the lower leg ulcerations she is actually having skin grafts/skin substitute performed by Dr. [name] at this time and tells me that things seem to be going well. I have not seen those wounds in some time. Integumentary (Hair, Skin) Wound #18 status is Open. Original cause of wound was Shear/Friction. The date acquired was:  08/23/2021. The wound is located on the Left,Lateral,Posterior Upper Leg. The wound measures 0.4cm length x 1.3cm width x 0.1cm depth; 0.408cm^2 area and 0.041cm^3 volume. There is Fat Layer (Subcutaneous Tissue) exposed. There is no tunneling or undermining noted. There is a medium amount of serosanguineous drainage noted. There is medium (34-66%) granulation within the wound bed. There is a medium (34-66%) amount of necrotic tissue within the wound bed including Adherent Slough. Wound #2 status is Open. Original cause of wound was Gradually Appeared. The date acquired was: 07/22/2020. The wound has been in treatment 40 weeks. The wound is located on the Left,Medial Upper  Leg. The wound measures 0.4cm length x 0.3cm width x 0.1cm depth; 0.094cm^2 area and 0.009cm^3 volume. There is Fat Layer (Subcutaneous Tissue) exposed. There is no undermining noted, however, there is tunneling at 3:00 with a maximum distance of 2.4cm. There is a medium amount of serosanguineous drainage noted. There is small (1-33%) red, pink granulation within the wound bed. There is a large (67-100%) amount of necrotic tissue within the wound bed including Adherent Slough. Wound #3 status is Healed - Epithelialized. Original cause of wound was Gradually Appeared. The date acquired was: 07/22/2020. The wound has been in treatment 40 weeks. The wound is located on the Left,Lateral Upper Leg. The wound measures 0cm length x 0cm width x 0cm depth; 0cm^2 area and 0cm^3 volume. There is a medium amount of sanguinous drainage noted. There is no granulation within the wound bed. There is no necrotic tissue within the wound bed. 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 Home Health: [place]: - Continue with [facility] for wound care [place], [place]) FAX [phone] Bilateral lower legs handled by Dr. Starla Link Ortho care for orders for bilateral lower legs HH initiated from hospital d/c [place] [place] for wound care. May utilize formulary equivalent dressing for wound treatment orders unless otherwise specified. Home Health Nurse may visit [facility] to address patient s wound care needs. **Please direct any NON-WOUND related issues/requests for orders to patient's Primary Care Physician. **If current dressing causes regression in wound condition, may D/C ordered dressing product/s and apply Normal Saline Moist Dressing daily until next [place] or Other MD appointment. **Notify W[facility]aling Center of regression in wound condition at [phone]. Edema Control - Lymphedema / Segmental Compressive Device / Other: Elevate leg(s) parallel to the floor when sitting. KELSEIGH, DIVER ([id number]) DO YOUR BEST to sleep in the bed at night. DO NOT sleep in your recliner. Long hours of sitting in a recliner leads to swelling of the legs and/or potential wounds on your backside. Off-Loading: Gel wheelchair cushion Air fluidized (Group 3) - stay in the bed except for meals. Turn and reposition every 2 hours Other: - keep pressure off of wounded areas. General Notes: Apply Eucerin to dry skin areas; continue to follow with Dr. [name] for bilateral lower legs and wounds with orders WOUND #18: - Upper Leg Wound Laterality: Left, Lateral, Posterior Cleanser: Soap and Water ([place]) 3 x Per Day/15  Days Discharge Instructions: Gently cleanse wound with antibacterial soap, rinse and pat dry prior to dressing wounds Primary Dressing: Hydrofera Blue Classic Foam Rope Dressing, 9x6 (mm/in) 3 x Per Day/15 Days Discharge Instructions: use in tunnel in medial thigh 2.4cm and leave tail out Secondary Dressing: Zetuvit Plus Silicone Border Dressing 4x4 (in/in) 3 x Per Day/15 Days WOUND #2: - Upper Leg Wound Laterality: Left, Medial Cleanser: Soap and Water ([place]) 3 x Per Day/15 Days Discharge Instructions: Gently cleanse wound with antibacterial soap, rinse and pat dry prior to dressing wounds Primary Dressing: Hydrofera Blue Classic Foam Rope  Dressing, 9x6 (mm/in) 3 x Per Day/15 Days Discharge Instructions: moisten with saline apply to wounds-cut to fit Secondary Dressing: Zetuvit Plus Silicone Border Dressing 4x4 (in/in) 3 x Per Day/15 Days 1. I would recommend that we use Hydrofera Blue rope for the wound on the medial thigh left leg. I think that we can also use a piece of this to got smaller and utilize over the lateral thigh as well on the left. 2. I am also can recommend that we have the patient change this 3 times a week I think that is good to be ideal. 3. I am also can recommend she continue to follow-up with Dr. [name] with regard to the lower extremity ulcerations. We will see patient back for reevaluation in 2 weeks here in the clinic. If anything worsens or changes patient will contact our office for additional recommendations. Electronic Signature(s) Signed: 09/13/2021 10:47:41 AM By: Worthy Keeler PA-C Entered By: Worthy Keeler on 09/13/2021 10:47:40 Amy Moses ([id number]) -------------------------------------------------------------------------------- SuperBill Details Patient Name: Amy Moses Date of Service: 09/13/2021 Medical Record Number: [id number] Patient Account Number: [id number] Date of Birth/Sex: 03-04-1971 (50 y.o. 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 L97.128 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 L89.610 Pressure ulcer of right heel, unstageable E11.42 Type 2 diabetes mellitus with diabetic polyneuropathy L89.313 Pressure ulcer of right buttock, stage 3 L89.323 Pressure ulcer of left buttock, stage 3 L97.822 Non-pressure chronic ulcer of other part of left lower leg with fat layer exposed L97.812 Non-pressure chronic ulcer of other part of right lower leg with fat layer exposed Facility Procedures CPT4 Code: 40973532 Description: 99213 - WOUND CARE VISIT-LEV 3 EST PT Modifier: Quantity: 1 Physician Procedures CPT4 Code: 9924268 Description: 99214 - WC PHYS LEVEL 4 - EST PT Modifier: Quantity: 1 CPT4 Code: Description: ICD-10 Diagnosis Description E83.59 Other disorders of calcium metabolism L97.118 Non-pressure chronic ulcer of right thigh with other specified severit L97.128 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 L89.610  Pressure ulcer of right heel, unstageable Modifier: y Quantity: Electronic Signature(s) Signed: 09/13/2021 10:48:06 AM By: Worthy Keeler PA-C Entered By: Worthy Keeler on 09/13/2021 10:48:05

## 2021-09-14 ENCOUNTER — Encounter: Payer: Medicare Other | Admitting: Orthopedic Surgery

## 2021-09-14 ENCOUNTER — Encounter: Payer: Self-pay | Admitting: Orthopedic Surgery

## 2021-09-14 ENCOUNTER — Ambulatory Visit (INDEPENDENT_AMBULATORY_CARE_PROVIDER_SITE_OTHER): Payer: Medicare Other | Admitting: Orthopedic Surgery

## 2021-09-14 DIAGNOSIS — L97923 Non-pressure chronic ulcer of unspecified part of left lower leg with necrosis of muscle: Secondary | ICD-10-CM | POA: Diagnosis not present

## 2021-09-14 DIAGNOSIS — L97911 Non-pressure chronic ulcer of unspecified part of right lower leg limited to breakdown of skin: Secondary | ICD-10-CM

## 2021-09-14 NOTE — Progress Notes (Signed)
Office Visit Note   Patient: Amy Moses           Date of Birth: March 17, 1971           MRN: [id number] Visit Date: 09/14/2021              Requested by: Wynelle Cleveland, MD No address on file PCP: Wynelle Cleveland, MD  Chief Complaint  Patient presents with   Right Leg - Routine Post Op    09/03/21 STSG bilateral lower extremities    Left Leg - Routine Post Op      HPI: Patient is a 50 year old woman presents in follow-up status post skin graft bilateral calves secondary to calciphylaxis wounds.  She is 2 weeks out from surgery.  Assessment & Plan: Visit Diagnoses:  1. Calciphylaxis of right lower extremity with nonhealing ulcer, limited to breakdown of skin ([place])   2. Calciphylaxis of left lower extremity with nonhealing ulcer with necrosis of muscle ([place])     Plan: Continue with twice a week compression wrap dressing changes with follow-up this Friday and next Monday.  Follow-Up Instructions: Return in about 1 week (around 09/21/2021).   Ortho Exam  Patient is alert, oriented, no adenopathy, well-dressed, normal affect, normal respiratory effort. Examination the wounds can show improvement with healthy granulation tissue no ischemic changes no odor.  Examination of the right heel the decubitus ulcer has completely healed.  The left heel has a superficial eschar that is slowly healing as well.  Imaging: No results found.     Labs: Lab Results  Component Value Date   HGBA1C 9.5 (H) 08/07/2021   HGBA1C 7.3 (H) 03/30/2021   HGBA1C 7.4 (H) 06/10/2020   ESRSEDRATE 40 (H) 08/07/2021   CRP 11.1 (H) 08/07/2021   REPTSTATUS 08/12/2021 FINAL 08/07/2021   GRAMSTAIN  07/08/2021    RARE WBC PRESENT,BOTH PMN AND MONONUCLEAR RARE GRAM POSITIVE RODS RARE GRAM POSITIVE COCCI IN PAIRS    CULT  08/07/2021    NO GROWTH 5 DAYS Performed at [facility], [place] [address], [place], [place] [address]    LABORGA CITROBACTER KOSERI 05/21/2021   LABORGA PROTEUS MIRABILIS  05/21/2021     Lab Results  Component Value Date   ALBUMIN 2.7 (L) 09/04/2021   ALBUMIN 2.4 (L) 08/12/2021   ALBUMIN 2.1 (L) 08/10/2021   PREALBUMIN 13.7 (L) 08/07/2021    Lab Results  Component Value Date   MG 2.1 05/14/2021   MG 2.8 (H) 05/11/2021   MG 2.3 04/23/2021   No results found for: VD25OH  Lab Results  Component Value Date   PREALBUMIN 13.7 (L) 08/07/2021   CBC EXTENDED Latest Ref Rng & Units 09/04/2021 09/03/2021 08/12/2021  WBC 4.0 - 10.5 K/uL 8.9 - 8.1  RBC 3.87 - 5.11 MIL/uL 3.49(L) - 4.21  HGB 12.0 - 15.0 g/dL 8.7(L) 11.2(L) 10.1(L)  HCT 36.0 - 46.0 % 29.8(L) 33.0(L) 33.4(L)  PLT 150 - 400 K/uL 276 - 294  NEUTROABS 1.7 - 7.7 K/uL - - -  LYMPHSABS 0.7 - 4.0 K/uL - - -     There is no height or weight on file to calculate BMI.  Orders:  No orders of the defined types were placed in this encounter.  No orders of the defined types were placed in this encounter.    Procedures: No procedures performed  Clinical Data: No additional findings.  ROS:  All other systems negative, except as noted in the HPI. Review of Systems  Objective: Vital Signs: LMP  08/22/2015 (Approximate)   Specialty Comments:  No specialty comments available.  PMFS History: Patient Active Problem List   Diagnosis Date Noted   Open leg wound 09/03/2021   Wound infection    Non-pressure chronic ulcer of right calf limited to breakdown of skin ([place])    Calciphylaxis of right lower extremity with nonhealing ulcer, limited to breakdown of skin ([place])    Chronic ulcer of left thigh ([place]) 08/07/2021   Calciphylaxis of left lower extremity with nonhealing ulcer with necrosis of muscle ([place]) 08/07/2021   Lower GI bleed 05/12/2021   Blindness 05/10/2021   Shingles 05/10/2021   Acute GI bleeding 04/24/2021   Acute blood loss anemia 04/23/2021   GI bleed 04/22/2021   Fever 03/31/2021   Acute on chronic heart failure with preserved ejection fraction (HFpEF) ([place]) 03/30/2021    Pressure injury of skin 03/30/2021   End stage renal disease ([place]) 11/16/2020   Calciphylaxis 11/06/2020   Non-healing open wound of heel 11/03/2020   Diabetic foot infection ([place]) 11/01/2020   Decubitus ulcer, heel 11/01/2020   Closed nondisplaced fracture of left patella 59/56/3875   Metabolic acidosis 64/33/2951   Acute on chronic renal failure ([place]) 06/10/2020   Symptomatic anemia 06/10/2020   Acute pericardial effusion 06/10/2020   Other acute nonsuppurative otitis media, unspecified ear 11/29/2019   Chronic kidney disease, stage 4 (severe) ([place]) 03/05/2019   Skin ulcer, limited to breakdown of skin ([place]) 01/28/2019   Vitamin D deficiency 01/28/2019   Uncontrolled type 2 diabetes mellitus with chronic kidney disease, with long-term current use of insulin 09/21/2015   Hyperlipidemia 09/21/2015   Essential hypertension, benign 09/21/2015   Primary hypothyroidism 09/21/2015   Iris bomb 07/31/2012   Secondary angle-closure glaucoma 07/31/2012   Past Medical History:  Diagnosis Date   Anemia    Blindness of right eye with low vision in contralateral eye    s/p victrectomy   Diabetes mellitus, type II ([place])    Dyslipidemia    Glaucoma    Hypertension    Hypothyroidism (acquired)    Kidney disease    Stage 5   Pneumonia     Family History  Problem Relation Age of Onset   Heart disease Mother    Diabetes Mother    Kidney disease Mother    Diabetes Father    Heart disease Father    Diabetes Brother    Colon cancer Neg Hx     Past Surgical History:  Procedure Laterality Date   ABDOMINAL AORTOGRAM W/LOWER EXTREMITY Bilateral 12/18/2020   Procedure: ABDOMINAL AORTOGRAM W/LOWER EXTREMITY;  Surgeon: Elam Dutch, MD;  Location: [place] [facility];  Service: Cardiovascular;  Laterality: Bilateral;   ANKLE FRACTURE SURGERY Right    AV FISTULA PLACEMENT Left 08/18/2020   Procedure: LEFT ARM BRACHIOCEPHALIC ARTERIOVENOUS (AV) FISTULA CREATION;  Surgeon: Elam Dutch,  MD;  Location: [facility];  Service: Vascular;  Laterality: Left;   BIOPSY  04/24/2021   Procedure: BIOPSY;  Surgeon: Eloise Harman, DO;  [facility];  Service: Endoscopy;;   CESAREAN SECTION     CHOLECYSTECTOMY     COLONOSCOPY  04/24/2021   Surgeon: Eloise Harman, DO;  nonbleeding internal hemorrhoids, 1 large (25 mm) pedunculated transverse colon polyp (prolapse type polyp) with adherent clot and stigmata of recent bleed.   COLONOSCOPY WITH PROPOFOL N/A 05/14/2021   Procedure: COLONOSCOPY WITH PROPOFOL;  Surgeon: Daneil Dolin, MD;  [facility];  Service: Endoscopy;  Laterality: N/A;  ESOPHAGOGASTRODUODENOSCOPY (EGD) WITH PROPOFOL N/A 04/24/2021   Surgeon: Eloise Harman, DO;  duodenal erosions and gastritis biopsied (pathology with peptic duodenitis, reactive gastropathy with erosions/chronic inflammation, negative for H. pylori)   EYE SURGERY     Vatrectomy   HEMOSTASIS CLIP PLACEMENT  05/14/2021   Procedure: HEMOSTASIS CLIP PLACEMENT;  Surgeon: Daneil Dolin, MD;  [facility];  Service: Endoscopy;;   IR PERC TUN PERIT CATH WO PORT S&I /IMAG  09/15/2020   IR REMOVAL TUN CV CATH W/O FL  02/19/2021   IR US GUIDE VASC ACCESS RIGHT  09/15/2020   POLYPECTOMY  04/24/2021   Procedure: POLYPECTOMY;  Surgeon: Eloise Harman, DO;  [facility];  Service: Endoscopy;;   POLYPECTOMY  05/14/2021   Procedure: POLYPECTOMY;  Surgeon: Daneil Dolin, MD;  [facility];  Service: Endoscopy;;   SKIN SPLIT GRAFT Bilateral 09/03/2021   Procedure: SKIN GRAFT BILATERAL LEGS;  Surgeon: Newt Minion, MD;  Location: [facility];  Service: Orthopedics;  Laterality: Bilateral;   TOE SURGERY     Social History   Occupational History   Not on file  Tobacco Use   Smoking status: Never   Smokeless tobacco: Never  Vaping Use   Vaping Use: Never used  Substance and Sexual Activity   Alcohol use: No   Drug use: No   Sexual activity: Yes     Birth control/protection: Condom

## 2021-09-14 NOTE — Progress Notes (Signed)
Post-Op Visit Note   Patient: Amy Moses           Date of Birth: 1971/10/24           MRN: [id number] Visit Date: 09/10/2021 PCP: Wynelle Cleveland, MD  Chief Complaint: No chief complaint on file.   HPI:  HPI The patient is a 50 year old woman who presents status post irrigation debridement of bilateral calf ulcers.  Wound vacs removed today.  She denies any concerns of pain with these.  Ortho Exam On examination of the right calf ulcer this is filled in with 100% beefy granulation tissue there is scant bloody drainage no surrounding erythema warmth or edema. The left lower extremity with massive calf wound encompassing the entire posterior aspect extending anteriorly.  There is significant bloody drainage.  The the wound itself is much improved.  There is no necrotic tissue filled in with 100% bleeding beefy tissue.  No surrounding erythema warmth or infection to the left calf  Visit Diagnoses:  1. Calciphylaxis of right lower extremity with nonhealing ulcer, limited to breakdown of skin ([place])   2. Calciphylaxis of left lower extremity with nonhealing ulcer with necrosis of muscle ([place])     Plan: Will apply Dynaflex compression wraps bilaterally with gauze in the wound bed.  She will follow-up for twice weekly dressing changes elevation for swelling minimize weightbearing  Follow-Up Instructions: No follow-ups on file.   Imaging: No results found.  Orders:  No orders of the defined types were placed in this encounter.  No orders of the defined types were placed in this encounter.    PMFS History: Patient Active Problem List   Diagnosis Date Noted   Open leg wound 09/03/2021   Wound infection    Non-pressure chronic ulcer of right calf limited to breakdown of skin ([place])    Calciphylaxis of right lower extremity with nonhealing ulcer, limited to breakdown of skin ([place])    Chronic ulcer of left thigh ([place]) 08/07/2021   Calciphylaxis of left lower extremity with  nonhealing ulcer with necrosis of muscle ([place]) 08/07/2021   Lower GI bleed 05/12/2021   Blindness 05/10/2021   Shingles 05/10/2021   Acute GI bleeding 04/24/2021   Acute blood loss anemia 04/23/2021   GI bleed 04/22/2021   Fever 03/31/2021   Acute on chronic heart failure with preserved ejection fraction (HFpEF) ([place]) 03/30/2021   Pressure injury of skin 03/30/2021   End stage renal disease ([place]) 11/16/2020   Calciphylaxis 11/06/2020   Non-healing open wound of heel 11/03/2020   Diabetic foot infection ([place]) 11/01/2020   Decubitus ulcer, heel 11/01/2020   Closed nondisplaced fracture of left patella 71/69/6789   Metabolic acidosis 38/08/1750   Acute on chronic renal failure ([place]) 06/10/2020   Symptomatic anemia 06/10/2020   Acute pericardial effusion 06/10/2020   Other acute nonsuppurative otitis media, unspecified ear 11/29/2019   Chronic kidney disease, stage 4 (severe) ([place]) 03/05/2019   Skin ulcer, limited to breakdown of skin ([place]) 01/28/2019   Vitamin D deficiency 01/28/2019   Uncontrolled type 2 diabetes mellitus with chronic kidney disease, with long-term current use of insulin 09/21/2015   Hyperlipidemia 09/21/2015   Essential hypertension, benign 09/21/2015   Primary hypothyroidism 09/21/2015   Iris bomb 07/31/2012   Secondary angle-closure glaucoma 07/31/2012   Past Medical History:  Diagnosis Date   Anemia    Blindness of right eye with low vision in contralateral eye    s/p victrectomy   Diabetes mellitus, type II ([place])  Dyslipidemia    Glaucoma    Hypertension    Hypothyroidism (acquired)    Kidney disease    Stage 5   Pneumonia     Family History  Problem Relation Age of Onset   Heart disease Mother    Diabetes Mother    Kidney disease Mother    Diabetes Father    Heart disease Father    Diabetes Brother    Colon cancer Neg Hx     Past Surgical History:  Procedure Laterality Date   ABDOMINAL AORTOGRAM W/LOWER EXTREMITY Bilateral 12/18/2020    Procedure: ABDOMINAL AORTOGRAM W/LOWER EXTREMITY;  Surgeon: Elam Dutch, MD;  Location: [facility];  Service: Cardiovascular;  Laterality: Bilateral;   ANKLE FRACTURE SURGERY Right    AV FISTULA PLACEMENT Left 08/18/2020   Procedure: LEFT ARM BRACHIOCEPHALIC ARTERIOVENOUS (AV) FISTULA CREATION;  Surgeon: Elam Dutch, MD;  Location: [facility];  Service: Vascular;  Laterality: Left;   BIOPSY  04/24/2021   Procedure: BIOPSY;  Surgeon: Eloise Harman, DO;  [facility];  Service: Endoscopy;;   CESAREAN SECTION     CHOLECYSTECTOMY     COLONOSCOPY  04/24/2021   Surgeon: Eloise Harman, DO;  nonbleeding internal hemorrhoids, 1 large (25 mm) pedunculated transverse colon polyp (prolapse type polyp) with adherent clot and stigmata of recent bleed.   COLONOSCOPY WITH PROPOFOL N/A 05/14/2021   Procedure: COLONOSCOPY WITH PROPOFOL;  Surgeon: Daneil Dolin, MD;  [facility];  Service: Endoscopy;  Laterality: N/A;   ESOPHAGOGASTRODUODENOSCOPY (EGD) WITH PROPOFOL N/A 04/24/2021   Surgeon: Eloise Harman, DO;  duodenal erosions and gastritis biopsied (pathology with peptic duodenitis, reactive gastropathy with erosions/chronic inflammation, negative for H. pylori)   EYE SURGERY     Vatrectomy   HEMOSTASIS CLIP PLACEMENT  05/14/2021   Procedure: HEMOSTASIS CLIP PLACEMENT;  Surgeon: Daneil Dolin, MD;  [facility];  Service: Endoscopy;;   IR PERC TUN PERIT CATH WO PORT S&I /IMAG  09/15/2020   IR REMOVAL TUN CV CATH W/O FL  02/19/2021   IR US GUIDE VASC ACCESS RIGHT  09/15/2020   POLYPECTOMY  04/24/2021   Procedure: POLYPECTOMY;  Surgeon: Eloise Harman, DO;  [facility];  Service: Endoscopy;;   POLYPECTOMY  05/14/2021   Procedure: POLYPECTOMY;  Surgeon: Daneil Dolin, MD;  [facility];  Service: Endoscopy;;   SKIN SPLIT GRAFT Bilateral 09/03/2021   Procedure: SKIN GRAFT BILATERAL LEGS;  Surgeon: Newt Minion, MD;   Location: [facility];  Service: Orthopedics;  Laterality: Bilateral;   TOE SURGERY     Social History   Occupational History   Not on file  Tobacco Use   Smoking status: Never   Smokeless tobacco: Never  Vaping Use   Vaping Use: Never used  Substance and Sexual Activity   Alcohol use: No   Drug use: No   Sexual activity: Yes    Birth control/protection: Condom

## 2021-09-17 ENCOUNTER — Ambulatory Visit: Payer: Medicare Other

## 2021-09-17 DIAGNOSIS — L97911 Non-pressure chronic ulcer of unspecified part of right lower leg limited to breakdown of skin: Secondary | ICD-10-CM

## 2021-09-17 DIAGNOSIS — L97923 Non-pressure chronic ulcer of unspecified part of left lower leg with necrosis of muscle: Secondary | ICD-10-CM

## 2021-09-17 NOTE — Progress Notes (Signed)
Patient is s/p BLE I&D application of STSG. She comes in today for BLE dressing changes. Dressings removed and the pt does have a moderate amount of drainage. There is no swelling and staples are intact from graft. Photos obtained and reapplied adaptic, ABD pad and 4 layer profore compression dressing. Pt is please with progress and did not voice any complaints. She has a follow p appt in the office to see Dr. [name] on 09/20/21 advised will update dressing frequency at that visit.   Ameisha Mcclellan, [place], [employer]

## 2021-09-19 NOTE — Progress Notes (Signed)
Office Visit Note   Patient: Amy Moses           Date of Birth: Nov 19, 1971           MRN: [id number] Visit Date: 08/19/2021              Requested by: Wynelle Cleveland, MD No address on file PCP: Wynelle Cleveland, MD  Chief Complaint  Patient presents with   Left Leg - Pain, Wound Check   Right Leg - Pain, Wound Check      HPI: Patient is a 50 year old woman who presents status post treatment for calciphylaxis ulcers both legs.  She states that overall she is doing well.  Dynaflex wraps are removed.  Assessment & Plan: Visit Diagnoses:  1. Calciphylaxis of right lower extremity with nonhealing ulcer, limited to breakdown of skin ([place])   2. Calciphylaxis of left lower extremity with nonhealing ulcer with necrosis of muscle ([place])     Plan: We will continue with compression wraps she has excellent granulation tissue Dynaflex applied x2.  Follow-Up Instructions: Return in about 1 week (around 08/26/2021).   Ortho Exam  Patient is alert, oriented, no adenopathy, well-dressed, normal affect, normal respiratory effort. Examination patient has healthy granulation tissue with bleeding from both legs.  There is no necrotic tissue no purulence no odor no drainage.  Imaging: No results found. No images are attached to the encounter.  Labs: Lab Results  Component Value Date   HGBA1C 9.5 (H) 08/07/2021   HGBA1C 7.3 (H) 03/30/2021   HGBA1C 7.4 (H) 06/10/2020   ESRSEDRATE 40 (H) 08/07/2021   CRP 11.1 (H) 08/07/2021   REPTSTATUS 08/12/2021 FINAL 08/07/2021   GRAMSTAIN  07/08/2021    RARE WBC PRESENT,BOTH PMN AND MONONUCLEAR RARE GRAM POSITIVE RODS RARE GRAM POSITIVE COCCI IN PAIRS    CULT  08/07/2021    NO GROWTH 5 DAYS Performed at [facility], [place] [address] [place], [place] [address]    LABORGA CITROBACTER KOSERI 05/21/2021   LABORGA PROTEUS MIRABILIS 05/21/2021     Lab Results  Component Value Date   ALBUMIN 2.7 (L) 09/04/2021   ALBUMIN 2.4 (L)  08/12/2021   ALBUMIN 2.1 (L) 08/10/2021   PREALBUMIN 13.7 (L) 08/07/2021    Lab Results  Component Value Date   MG 2.1 05/14/2021   MG 2.8 (H) 05/11/2021   MG 2.3 04/23/2021   No results found for: VD25OH  Lab Results  Component Value Date   PREALBUMIN 13.7 (L) 08/07/2021   CBC EXTENDED Latest Ref Rng & Units 09/04/2021 09/03/2021 08/12/2021  WBC 4.0 - 10.5 K/uL 8.9 - 8.1  RBC 3.87 - 5.11 MIL/uL 3.49(L) - 4.21  HGB 12.0 - 15.0 g/dL 8.7(L) 11.2(L) 10.1(L)  HCT 36.0 - 46.0 % 29.8(L) 33.0(L) 33.4(L)  PLT 150 - 400 K/uL 276 - 294  NEUTROABS 1.7 - 7.7 K/uL - - -  LYMPHSABS 0.7 - 4.0 K/uL - - -     There is no height or weight on file to calculate BMI.  Orders:  No orders of the defined types were placed in this encounter.  No orders of the defined types were placed in this encounter.    Procedures: No procedures performed  Clinical Data: No additional findings.  ROS:  All other systems negative, except as noted in the HPI. Review of Systems  Objective: Vital Signs: LMP 08/22/2015 (Approximate)   Specialty Comments:  No specialty comments available.  PMFS History: Patient Active Problem List   Diagnosis Date  Noted   Open leg wound 09/03/2021   Wound infection    Non-pressure chronic ulcer of right calf limited to breakdown of skin ([place])    Calciphylaxis of right lower extremity with nonhealing ulcer, limited to breakdown of skin ([place])    Chronic ulcer of left thigh ([place]) 08/07/2021   Calciphylaxis of left lower extremity with nonhealing ulcer with necrosis of muscle ([place]) 08/07/2021   Lower GI bleed 05/12/2021   Blindness 05/10/2021   Shingles 05/10/2021   Acute GI bleeding 04/24/2021   Acute blood loss anemia 04/23/2021   GI bleed 04/22/2021   Fever 03/31/2021   Acute on chronic heart failure with preserved ejection fraction (HFpEF) ([place]) 03/30/2021   Pressure injury of skin 03/30/2021   End stage renal disease ([place]) 11/16/2020   Calciphylaxis  11/06/2020   Non-healing open wound of heel 11/03/2020   Diabetic foot infection ([place]) 11/01/2020   Decubitus ulcer, heel 11/01/2020   Closed nondisplaced fracture of left patella 09/81/1914   Metabolic acidosis 78/29/5621   Acute on chronic renal failure ([place]) 06/10/2020   Symptomatic anemia 06/10/2020   Acute pericardial effusion 06/10/2020   Other acute nonsuppurative otitis media, unspecified ear 11/29/2019   Chronic kidney disease, stage 4 (severe) ([place]) 03/05/2019   Skin ulcer, limited to breakdown of skin ([place]) 01/28/2019   Vitamin D deficiency 01/28/2019   Uncontrolled type 2 diabetes mellitus with chronic kidney disease, with long-term current use of insulin 09/21/2015   Hyperlipidemia 09/21/2015   Essential hypertension, benign 09/21/2015   Primary hypothyroidism 09/21/2015   Iris bomb 07/31/2012   Secondary angle-closure glaucoma 07/31/2012   Past Medical History:  Diagnosis Date   Anemia    Blindness of right eye with low vision in contralateral eye    s/p victrectomy   Diabetes mellitus, type II ([place])    Dyslipidemia    Glaucoma    Hypertension    Hypothyroidism (acquired)    Kidney disease    Stage 5   Pneumonia     Family History  Problem Relation Age of Onset   Heart disease Mother    Diabetes Mother    Kidney disease Mother    Diabetes Father    Heart disease Father    Diabetes Brother    Colon cancer Neg Hx     Past Surgical History:  Procedure Laterality Date   ABDOMINAL AORTOGRAM W/LOWER EXTREMITY Bilateral 12/18/2020   Procedure: ABDOMINAL AORTOGRAM W/LOWER EXTREMITY;  Surgeon: Elam Dutch, MD;  Location: [facility];  Service: Cardiovascular;  Laterality: Bilateral;   ANKLE FRACTURE SURGERY Right    AV FISTULA PLACEMENT Left 08/18/2020   Procedure: LEFT ARM BRACHIOCEPHALIC ARTERIOVENOUS (AV) FISTULA CREATION;  Surgeon: Elam Dutch, MD;  Location: [facility];  Service: Vascular;  Laterality: Left;   BIOPSY  04/24/2021    Procedure: BIOPSY;  Surgeon: Eloise Harman, DO;  [facility];  Service: Endoscopy;;   CESAREAN SECTION     CHOLECYSTECTOMY     COLONOSCOPY  04/24/2021   Surgeon: Eloise Harman, DO;  nonbleeding internal hemorrhoids, 1 large (25 mm) pedunculated transverse colon polyp (prolapse type polyp) with adherent clot and stigmata of recent bleed.   COLONOSCOPY WITH PROPOFOL N/A 05/14/2021   Procedure: COLONOSCOPY WITH PROPOFOL;  Surgeon: Daneil Dolin, MD;  [facility];  Service: Endoscopy;  Laterality: N/A;   ESOPHAGOGASTRODUODENOSCOPY (EGD) WITH PROPOFOL N/A 04/24/2021   Surgeon: Eloise Harman, DO;  duodenal erosions and gastritis biopsied (pathology with peptic  duodenitis, reactive gastropathy with erosions/chronic inflammation, negative for H. pylori)   EYE SURGERY     Vatrectomy   HEMOSTASIS CLIP PLACEMENT  05/14/2021   Procedure: HEMOSTASIS CLIP PLACEMENT;  Surgeon: Daneil Dolin, MD;  [facility];  Service: Endoscopy;;   IR PERC TUN PERIT CATH WO PORT S&I /IMAG  09/15/2020   IR REMOVAL TUN CV CATH W/O FL  02/19/2021   IR US GUIDE VASC ACCESS RIGHT  09/15/2020   POLYPECTOMY  04/24/2021   Procedure: POLYPECTOMY;  Surgeon: Eloise Harman, DO;  [facility];  Service: Endoscopy;;   POLYPECTOMY  05/14/2021   Procedure: POLYPECTOMY;  Surgeon: Daneil Dolin, MD;  [facility];  Service: Endoscopy;;   SKIN SPLIT GRAFT Bilateral 09/03/2021   Procedure: SKIN GRAFT BILATERAL LEGS;  Surgeon: Newt Minion, MD;  Location: [facility];  Service: Orthopedics;  Laterality: Bilateral;   TOE SURGERY     Social History   Occupational History   Not on file  Tobacco Use   Smoking status: Never   Smokeless tobacco: Never  Vaping Use   Vaping Use: Never used  Substance and Sexual Activity   Alcohol use: No   Drug use: No   Sexual activity: Yes    Birth control/protection: Condom

## 2021-09-20 ENCOUNTER — Ambulatory Visit (INDEPENDENT_AMBULATORY_CARE_PROVIDER_SITE_OTHER): Payer: Medicare Other | Admitting: Orthopedic Surgery

## 2021-09-20 ENCOUNTER — Ambulatory Visit: Payer: Medicaid - Out of State | Admitting: Gastroenterology

## 2021-09-20 ENCOUNTER — Telehealth: Payer: Self-pay | Admitting: Orthopedic Surgery

## 2021-09-20 ENCOUNTER — Encounter: Payer: Self-pay | Admitting: Orthopedic Surgery

## 2021-09-20 ENCOUNTER — Ambulatory Visit: Payer: Medicare Other | Admitting: Physician Assistant

## 2021-09-20 ENCOUNTER — Other Ambulatory Visit: Payer: Self-pay

## 2021-09-20 DIAGNOSIS — L97911 Non-pressure chronic ulcer of unspecified part of right lower leg limited to breakdown of skin: Secondary | ICD-10-CM | POA: Diagnosis not present

## 2021-09-20 DIAGNOSIS — L97923 Non-pressure chronic ulcer of unspecified part of left lower leg with necrosis of muscle: Secondary | ICD-10-CM | POA: Diagnosis not present

## 2021-09-20 NOTE — [place] Notes (Signed)
Office Visit Note   Patient: Amy Moses           Date of Birth: [birth year]           MRN: [id number] Visit Date: 09/20/2021              Requested by: Wynelle Cleveland, MD No address on file PCP: Wynelle Cleveland, MD  Chief Complaint  Patient presents with   Right Leg - Routine Post Op    I&D application of STSG 35/00/93   Left Leg - Routine Post Op      HPI: Patient is a 50 year old woman who presents in follow-up for skin graft posterior aspects of both calves due to large necrotic ulcers from calciphylaxis.  Assessment & Plan: Visit Diagnoses:  1. Calciphylaxis of right lower extremity with nonhealing ulcer, limited to breakdown of skin ([place])   2. Calciphylaxis of left lower extremity with nonhealing ulcer with necrosis of muscle ([place])     Plan: We will continue with twice a week dressing changes for the next 2 weeks and anticipate advancing to socks at that time.  Follow-Up Instructions: No follow-ups on file.   Ortho Exam  Patient is alert, oriented, no adenopathy, well-dressed, normal affect, normal respiratory effort. Examination patient continues to show new epithelialization around the wound edges with the wound bed having 100% healthy granulation tissue there is significant decreased drainage.  Imaging: No results found. No images are attached to the encounter.  Labs: Lab Results  Component Value Date   HGBA1C 9.5 (H) 08/07/2021   HGBA1C 7.3 (H) 03/30/2021   HGBA1C 7.4 (H) 06/10/2020   ESRSEDRATE 40 (H) 08/07/2021   CRP 11.1 (H) 08/07/2021   REPTSTATUS 08/12/2021 FINAL 08/07/2021   GRAMSTAIN  07/08/2021    RARE WBC PRESENT,BOTH PMN AND MONONUCLEAR RARE GRAM POSITIVE RODS RARE GRAM POSITIVE COCCI IN PAIRS    CULT  08/07/2021    NO GROWTH 5 DAYS Performed at [facility], [place] [address] [place], [place] [address]    LABORGA CITROBACTER KOSERI 05/21/2021   LABORGA PROTEUS MIRABILIS 05/21/2021     Lab Results  Component Value Date    ALBUMIN 2.7 (L) 09/04/2021   ALBUMIN 2.4 (L) 08/12/2021   ALBUMIN 2.1 (L) 08/10/2021   PREALBUMIN 13.7 (L) 08/07/2021    Lab Results  Component Value Date   MG 2.1 05/14/2021   MG 2.8 (H) 05/11/2021   MG 2.3 04/23/2021   No results found for: VD25OH  Lab Results  Component Value Date   PREALBUMIN 13.7 (L) 08/07/2021   CBC EXTENDED Latest Ref Rng & Units 09/04/2021 09/03/2021 08/12/2021  WBC 4.0 - 10.5 K/uL 8.9 - 8.1  RBC 3.87 - 5.11 MIL/uL 3.49(L) - 4.21  HGB 12.0 - 15.0 g/dL 8.7(L) 11.2(L) 10.1(L)  HCT 36.0 - 46.0 % 29.8(L) 33.0(L) 33.4(L)  PLT 150 - 400 K/uL 276 - 294  NEUTROABS 1.7 - 7.7 K/uL - - -  LYMPHSABS 0.7 - 4.0 K/uL - - -     There is no height or weight on file to calculate BMI.  Orders:  No orders of the defined types were placed in this encounter.  No orders of the defined types were placed in this encounter.    Procedures: No procedures performed  Clinical Data: No additional findings.  ROS:  All other systems negative, except as noted in the HPI. Review of Systems  Objective: Vital Signs: LMP 08/22/2015 (Approximate)   Specialty Comments:  No specialty comments available.  PMFS History: Patient Active Problem List   Diagnosis Date Noted   Open leg wound 09/03/2021   Wound infection    Non-pressure chronic ulcer of right calf limited to breakdown of skin ([place])    Calciphylaxis of right lower extremity with nonhealing ulcer, limited to breakdown of skin ([place])    Chronic ulcer of left thigh ([place]) 08/07/2021   Calciphylaxis of left lower extremity with nonhealing ulcer with necrosis of muscle ([place]) 08/07/2021   Lower GI bleed 05/12/2021   Blindness 05/10/2021   Shingles 05/10/2021   Acute GI bleeding 04/24/2021   Acute blood loss anemia 04/23/2021   GI bleed 04/22/2021   Fever 03/31/2021   Acute on chronic heart failure with preserved ejection fraction (HFpEF) ([place]) 03/30/2021   Pressure injury of skin 03/30/2021   End stage renal  disease ([place]) 11/16/2020   Calciphylaxis 11/06/2020   Non-healing open wound of heel 11/03/2020   Diabetic foot infection ([place]) 11/01/2020   Decubitus ulcer, heel 11/01/2020   Closed nondisplaced fracture of left patella 78/93/8101   Metabolic acidosis 75/08/2584   Acute on chronic renal failure ([place]) 06/10/2020   Symptomatic anemia 06/10/2020   Acute pericardial effusion 06/10/2020   Other acute nonsuppurative otitis media, unspecified ear 11/29/2019   Chronic kidney disease, stage 4 (severe) ([place]) 03/05/2019   Skin ulcer, limited to breakdown of skin ([place]) 01/28/2019   Vitamin D deficiency 01/28/2019   Uncontrolled type 2 diabetes mellitus with chronic kidney disease, with long-term current use of insulin 09/21/2015   Hyperlipidemia 09/21/2015   Essential hypertension, benign 09/21/2015   Primary hypothyroidism 09/21/2015   Iris bomb 07/31/2012   Secondary angle-closure glaucoma 07/31/2012   Past Medical History:  Diagnosis Date   Anemia    Blindness of right eye with low vision in contralateral eye    s/p victrectomy   Diabetes mellitus, type II ([place])    Dyslipidemia    Glaucoma    Hypertension    Hypothyroidism (acquired)    Kidney disease    Stage 5   Pneumonia     Family History  Problem Relation Age of Onset   Heart disease Mother    Diabetes Mother    Kidney disease Mother    Diabetes Father    Heart disease Father    Diabetes Brother    Colon cancer Neg Hx     Past Surgical History:  Procedure Laterality Date   ABDOMINAL AORTOGRAM W/LOWER EXTREMITY Bilateral 12/18/2020   Procedure: ABDOMINAL AORTOGRAM W/LOWER EXTREMITY;  Surgeon: Elam Dutch, MD;  Location: [facility];  Service: Cardiovascular;  Laterality: Bilateral;   ANKLE FRACTURE SURGERY Right    AV FISTULA PLACEMENT Left 08/18/2020   Procedure: LEFT ARM BRACHIOCEPHALIC ARTERIOVENOUS (AV) FISTULA CREATION;  Surgeon: Elam Dutch, MD;  Location: [facility];  Service: Vascular;   Laterality: Left;   BIOPSY  04/24/2021   Procedure: BIOPSY;  Surgeon: Eloise Harman, DO;  [facility];  Service: Endoscopy;;   CESAREAN SECTION     CHOLECYSTECTOMY     COLONOSCOPY  04/24/2021   Surgeon: Eloise Harman, DO;  nonbleeding internal hemorrhoids, 1 large (25 mm) pedunculated transverse colon polyp (prolapse type polyp) with adherent clot and stigmata of recent bleed.   COLONOSCOPY WITH PROPOFOL N/A 05/14/2021   Procedure: COLONOSCOPY WITH PROPOFOL;  Surgeon: Daneil Dolin, MD;  [facility];  Service: Endoscopy;  Laterality: N/A;   ESOPHAGOGASTRODUODENOSCOPY (EGD) WITH PROPOFOL N/A 04/24/2021   Surgeon: Eloise Harman,  DO;  duodenal erosions and gastritis biopsied (pathology with peptic duodenitis, reactive gastropathy with erosions/chronic inflammation, negative for H. pylori)   EYE SURGERY     Vatrectomy   HEMOSTASIS CLIP PLACEMENT  05/14/2021   Procedure: HEMOSTASIS CLIP PLACEMENT;  Surgeon: Daneil Dolin, MD;  [facility];  Service: Endoscopy;;   IR PERC TUN PERIT CATH WO PORT S&I /IMAG  09/15/2020   IR REMOVAL TUN CV CATH W/O FL  02/19/2021   IR US GUIDE VASC ACCESS RIGHT  09/15/2020   POLYPECTOMY  04/24/2021   Procedure: POLYPECTOMY;  Surgeon: Eloise Harman, DO;  [facility];  Service: Endoscopy;;   POLYPECTOMY  05/14/2021   Procedure: POLYPECTOMY;  Surgeon: Daneil Dolin, MD;  [facility];  Service: Endoscopy;;   SKIN SPLIT GRAFT Bilateral 09/03/2021   Procedure: SKIN GRAFT BILATERAL LEGS;  Surgeon: Newt Minion, MD;  Location: [facility];  Service: Orthopedics;  Laterality: Bilateral;   TOE SURGERY     Social History   Occupational History   Not on file  Tobacco Use   Smoking status: Never   Smokeless tobacco: Never  Vaping Use   Vaping Use: Never used  Substance and Sexual Activity   Alcohol use: No   Drug use: No   Sexual activity: Yes    Birth control/protection: Condom

## 2021-09-20 NOTE — Telephone Encounter (Signed)
aPt called requesting a call back from Autumn. F Pt states she need Autumn to open an appt for her. Please call pt at [phone].

## 2021-09-20 NOTE — Telephone Encounter (Signed)
Can you please call pt and see what day she needs to come in and I will open anything you need. Thanks

## 2021-09-21 NOTE — Telephone Encounter (Signed)
Pt's appt is sch'd as follows: 11/3 at 1pm for nurse visit, 11/7 at 8:45am with [name], and 11/10 at [place] with [name]

## 2021-09-23 ENCOUNTER — Ambulatory Visit: Payer: Medicare Other

## 2021-09-27 ENCOUNTER — Other Ambulatory Visit: Payer: Self-pay

## 2021-09-27 ENCOUNTER — Ambulatory Visit: Payer: Medicare Other | Admitting: Orthopedic Surgery

## 2021-09-27 ENCOUNTER — Encounter: Payer: Medicare Other | Attending: Physician Assistant | Admitting: Physician Assistant

## 2021-09-27 DIAGNOSIS — N186 End stage renal disease: Secondary | ICD-10-CM | POA: Insufficient documentation

## 2021-09-27 DIAGNOSIS — E1042 Type 1 diabetes mellitus with diabetic polyneuropathy: Secondary | ICD-10-CM | POA: Diagnosis present

## 2021-09-27 DIAGNOSIS — L89323 Pressure ulcer of left buttock, stage 3: Secondary | ICD-10-CM | POA: Diagnosis not present

## 2021-09-27 DIAGNOSIS — E1151 Type 2 diabetes mellitus with diabetic peripheral angiopathy without gangrene: Secondary | ICD-10-CM | POA: Diagnosis not present

## 2021-09-27 DIAGNOSIS — E1122 Type 2 diabetes mellitus with diabetic chronic kidney disease: Secondary | ICD-10-CM | POA: Insufficient documentation

## 2021-09-27 DIAGNOSIS — L89610 Pressure ulcer of right heel, unstageable: Secondary | ICD-10-CM | POA: Insufficient documentation

## 2021-09-27 DIAGNOSIS — I120 Hypertensive chronic kidney disease with stage 5 chronic kidney disease or end stage renal disease: Secondary | ICD-10-CM | POA: Insufficient documentation

## 2021-09-27 DIAGNOSIS — L97812 Non-pressure chronic ulcer of other part of right lower leg with fat layer exposed: Secondary | ICD-10-CM | POA: Diagnosis not present

## 2021-09-27 DIAGNOSIS — L97822 Non-pressure chronic ulcer of other part of left lower leg with fat layer exposed: Secondary | ICD-10-CM | POA: Insufficient documentation

## 2021-09-27 DIAGNOSIS — L89313 Pressure ulcer of right buttock, stage 3: Secondary | ICD-10-CM | POA: Insufficient documentation

## 2021-09-27 NOTE — Progress Notes (Addendum)
SANGEETA, YOUSE ([id number]) Visit Report for 09/27/2021 Arrival Information Details Patient Name: Amy Moses, Amy Moses Date of Service: 09/27/2021 9:30 AM Medical Record Number: [id number] Patient Account Number: [id number] Date of Birth/Sex: 12-07-1970 (50 y.o. F) Treating RN: Donnamarie Poag Primary Care Dre Gamino: Wynelle Cleveland Other Clinician: Referring Tarren Velardi: Wynelle Cleveland Treating Harrie Cazarez/Extender: Skipper Cliche in Treatment: 3 Visit Information History Since Last Visit Added or deleted any medications: No Patient Arrived: Wheel Chair Had a fall or experienced change in No Arrival Time: 08:52 activities of daily living that may affect Accompanied By: self risk of falls: Transfer Assistance: None Hospitalized since last visit: No Patient Identification Verified: Yes Has Dressing in Place as Prescribed: Yes Secondary Verification Process Completed: Yes Pain Present Now: No Patient Requires Transmission-Based No Precautions: Patient Has Alerts: Yes Patient Alerts: Patient on Blood Thinner ASPIRIN Electronic Signature(s) Signed: 09/27/2021 9:53:33 AM By: Donnamarie Poag Entered By: Donnamarie Poag on 09/27/2021 09:33:46 Amy Moses ([id number]) -------------------------------------------------------------------------------- Clinic Level of Care Assessment Details Patient Name: Amy Moses Date of Service: 09/27/2021 9:30 AM Medical Record Number: [id number] Patient Account Number: [id number] Date of Birth/Sex: 27-Sep-1971 (50 y.o. F) Treating RN: Donnamarie Poag Primary Care Jaiyanna Safran: Wynelle Cleveland Other Clinician: Referring Kyliee Ortego: Wynelle Cleveland Treating Sheldon Sem/Extender: Skipper Cliche in Treatment: 42 Clinic Level of Care Assessment Items TOOL 4 Quantity Score []  - Use when only an EandM is performed on FOLLOW-UP visit 0 ASSESSMENTS - Nursing Assessment / Reassessment []  - Reassessment of Co-morbidities (includes updates in patient status) 0 []  -  0 Reassessment of Adherence to Treatment Plan ASSESSMENTS - Wound and Skin Assessment / Reassessment []  - Simple Wound Assessment / Reassessment - one wound 0 X- 2 5 Complex Wound Assessment / Reassessment - multiple wounds []  - 0 Dermatologic / Skin Assessment (not related to wound area) ASSESSMENTS - Focused Assessment []  - Circumferential Edema Measurements - multi extremities 0 []  - 0 Nutritional Assessment / Counseling / Intervention []  - 0 Lower Extremity Assessment (monofilament, tuning fork, pulses) []  - 0 Peripheral Arterial Disease Assessment (using hand held doppler) ASSESSMENTS - Ostomy and/or Continence Assessment and Care []  - Incontinence Assessment and Management 0 []  - 0 Ostomy Care Assessment and Management (repouching, etc.) PROCESS - Coordination of Care X - Simple Patient / Family Education for ongoing care 1 15 []  - 0 Complex (extensive) Patient / Family Education for ongoing care []  - 0 Staff obtains Programmer, systems, Records, Test Results / Process Orders X- 1 10 Staff telephones HHA, Nursing Homes / Clarify orders / etc []  - 0 Routine Transfer to another Facility (non-emergent condition) []  - 0 Routine Hospital Admission (non-emergent condition) []  - 0 New Admissions / Biomedical engineer / Ordering NPWT, Apligraf, etc. []  - 0 Emergency Hospital Admission (emergent condition) X- 1 10 Simple Discharge Coordination []  - 0 Complex (extensive) Discharge Coordination PROCESS - Special Needs []  - Pediatric / Minor Patient Management 0 []  - 0 Isolation Patient Management []  - 0 Hearing / Language / Visual special needs []  - 0 Assessment of Community assistance (transportation, D/C planning, etc.) []  - 0 Additional assistance / Altered mentation []  - 0 Support Surface(s) Assessment (bed, cushion, seat, etc.) INTERVENTIONS - Wound Cleansing / Measurement Dolle, Abra ([id number]) []  - 0 Simple Wound Cleansing - one wound X- 2 5 Complex Wound  Cleansing - multiple wounds X- 1 5 Wound Imaging (photographs - any number of wounds) []  - 0 Wound Tracing (instead of photographs) []  - 0 Simple Wound Measurement - one wound X- 2 5 Complex Wound  Measurement - multiple wounds INTERVENTIONS - Wound Dressings []  - Small Wound Dressing one or multiple wounds 0 X- 2 15 Medium Wound Dressing one or multiple wounds []  - 0 Large Wound Dressing one or multiple wounds X- 1 5 Application of Medications - topical []  - 0 Application of Medications - injection INTERVENTIONS - Miscellaneous []  - External ear exam 0 []  - 0 Specimen Collection (cultures, biopsies, blood, body fluids, etc.) []  - 0 Specimen(s) / Culture(s) sent or taken to Lab for analysis []  - 0 Patient Transfer (multiple staff / Civil Service fast streamer / Similar devices) []  - 0 Simple Staple / Suture removal (25 or less) []  - 0 Complex Staple / Suture removal (26 or more) []  - 0 Hypo / Hyperglycemic Management (close monitor of Blood Glucose) []  - 0 Ankle / Brachial Index (ABI) - do not check if billed separately X- 1 5 Vital Signs Has the patient been seen at the hospital within the last three years: Yes Total Score: 110 Level Of Care: New/Established - Level 3 Electronic Signature(s) Signed: 09/27/2021 1:59:03 PM By: Donnamarie Poag Entered By: Donnamarie Poag on 09/27/2021 10:08:09 Amy Moses ([id number]) -------------------------------------------------------------------------------- Encounter Discharge Information Details Patient Name: Amy Moses Date of Service: 09/27/2021 9:30 AM Medical Record Number: [id number] Patient Account Number: [id number] Date of Birth/Sex: 1971-09-17 (50 y.o. F) Treating RN: Donnamarie Poag Primary Care Jamale Spangler: Wynelle Cleveland Other Clinician: Referring Ambar Raphael: Wynelle Cleveland Treating Kavya Haag/Extender: Skipper Cliche in Treatment: 47 Encounter Discharge Information Items Discharge Condition: Stable Ambulatory Status:  Wheelchair Discharge Destination: Home Transportation: Other Accompanied By: self Schedule Follow-up Appointment: Yes Clinical Summary of Care: Electronic Signature(s) Signed: 09/27/2021 1:59:03 PM By: Donnamarie Poag Entered By: Donnamarie Poag on 09/27/2021 10:09:18 Amy Moses ([id number]) -------------------------------------------------------------------------------- Lower Extremity Assessment Details Patient Name: Amy Moses Date of Service: 09/27/2021 9:30 AM Medical Record Number: [id number] Patient Account Number: [id number] Date of Birth/Sex: [birth year] (50 y.o. F) Treating RN: Donnamarie Poag Primary Care Sahiti Joswick: Wynelle Cleveland Other Clinician: Referring Courtlynn Holloman: Wynelle Cleveland Treating Dathan Attia/Extender: Skipper Cliche in Treatment: 42 Notes unable to assess as wound care to bilateral lower legs con't to be done by ortho/Dr. [name] per pt-observe bilateral compression wrap to both lower legs Electronic Signature(s) Signed: 09/27/2021 9:53:33 AM By: Donnamarie Poag Entered By: Donnamarie Poag on 09/27/2021 09:46:19 Amy Moses ([id number]) -------------------------------------------------------------------------------- Multi Wound Chart Details Patient Name: Amy Moses Date of Service: 09/27/2021 9:30 AM Medical Record Number: [id number] Patient Account Number: [id number] Date of Birth/Sex: 1971/02/13 (50 y.o. F) Treating RN: Donnamarie Poag Primary Care Nefi Musich: Wynelle Cleveland Other Clinician: Referring Zoe Nordin: Wynelle Cleveland Treating Symphony Demuro/Extender: Skipper Cliche in Treatment: 42 Vital Signs Height(in): 85 Pulse(bpm): 80 Weight(lbs): 235 Blood Pressure(mmHg): 154/89 Body Mass Index(BMI): 38 Temperature(F): 97.8 Respiratory Rate(breaths/min): 16 Photos: [11:No Photos] [16:No Photos] Wound Location: Right, Distal, Posterior Lower Leg Left, Distal, Plantar Foot Left, Lateral, Posterior Upper Leg Wounding Event: Gradually Appeared Pressure Injury  Shear/Friction Primary Etiology: Calciphylaxis Pressure Ulcer Abrasion Comorbid History: Cataracts, Glaucoma, Anemia, Cataracts, Glaucoma, Anemia, Cataracts, Glaucoma, Anemia, Hypertension, Type II Diabetes, End Hypertension, Type II Diabetes, End Hypertension, Type II Diabetes, End Stage Renal Disease, Neuropathy Stage Renal Disease, Neuropathy Stage Renal Disease, Neuropathy Date Acquired: 03/21/2021 07/23/2021 08/23/2021 Weeks of Treatment: 25 9 2  Wound Status: Open Open Open Measurements L x W x D (cm) N/A N/A 0.2x0.2x0.1 Area (cm) : N/A N/A 0.031 Volume (cm) : N/A N/A 0.003 % Reduction in Area: N/A N/A 92.40% % Reduction in Volume: N/A N/A 92.70% Tunneling: N/A N/A No Classification: Full Thickness  Without Exposed Unstageable/Unclassified Full Thickness Without Exposed Support Structures Support Structures Exudate Amount: Medium None Present Medium Exudate Type: Serosanguineous N/A Serosanguineous Exudate Color: red, brown N/A red, brown Granulation Amount: N/A N/A None Present (0%) Granulation Quality: N/A N/A N/A Necrotic Amount: N/A N/A Large (67-100%) Necrotic Tissue: N/A N/A Eschar Epithelialization: N/A N/A N/A Assessment Notes: unable to assess as wound care to unable to assess as wound care to N/A bilateral lower legs con't to be done bilateral lower legs con't to be done by ortho/Dr. [name] per pt-observe by ortho/Dr. [name] per pt-observe bilateral compression wrap to both bilateral compression wrap to both lower legs lower legs Wound Number: 2 4 9  Photos: No Photos No Photos MARDELL, SUTTLES ([id number]) Wound Location: Left, Medial Upper Leg Right Calcaneus Left, Lateral Calf Wounding Event: Gradually Appeared Gradually Appeared Gradually Appeared Primary Etiology: Calciphylaxis Pressure Ulcer Calciphylaxis Comorbid History: Cataracts, Glaucoma, Anemia, Cataracts, Glaucoma, Anemia, Cataracts, Glaucoma, Anemia, Hypertension, Type II Diabetes, End Hypertension, Type II  Diabetes, End Hypertension, Type II Diabetes, End Stage Renal Disease, Neuropathy Stage Renal Disease, Neuropathy Stage Renal Disease, Neuropathy Date Acquired: 07/22/2020 10/21/2020 02/16/2021 Weeks of Treatment: 42 42 31 Wound Status: Open Open Open Measurements L x W x D (cm) 0.3x0.2x0.1 N/A N/A Area (cm) : 0.047 N/A N/A Volume (cm) : 0.005 N/A N/A % Reduction in Area: 100.00% N/A N/A % Reduction in Volume: 100.00% N/A N/A Position 1 (o'clock): 3 Maximum Distance 1 (cm): 2.4 Tunneling: Yes N/A N/A Classification: Full Thickness Without Exposed Unstageable/Unclassified Full Thickness With Exposed Support Structures Support Structures Exudate Amount: Medium Large Large Exudate Type: Serosanguineous Serosanguineous Serosanguineous Exudate Color: red, brown red, brown red, brown Granulation Amount: Medium (34-66%) N/A N/A Granulation Quality: Red, Pink N/A N/A Necrotic Amount: Medium (34-66%) N/A N/A Necrotic Tissue: Adherent Slough N/A N/A Exposed Structures: Fat Layer (Subcutaneous Tissue): N/A N/A Yes Fascia: No Tendon: No Muscle: No Joint: No Bone: No Epithelialization: None N/A N/A Assessment Notes: N/A unable to assess as wound care to unable to assess as wound care to bilateral lower legs con't to be done bilateral lower legs con't to be done by ortho/Dr. [name] per pt-observe by ortho/Dr. [name] per pt-observe bilateral compression wrap to both bilateral compression wrap to both lower legs lower legs Treatment Notes Electronic Signature(s) Signed: 09/27/2021 9:53:33 AM By: Donnamarie Poag Entered By: Donnamarie Poag on 09/27/2021 09:46:38 Amy Moses ([id number]) -------------------------------------------------------------------------------- Twain Details Patient Name: Amy Moses Date of Service: 09/27/2021 9:30 AM Medical Record Number: [id number] Patient Account Number: [id number] Date of Birth/Sex: 1971/03/15 (50 y.o. F) Treating RN: Donnamarie Poag Primary Care Yoshiye Kraft: Wynelle Cleveland Other Clinician: Referring Nichalas Coin: Wynelle Cleveland Treating Tanesia Butner/Extender: Jeri Cos Weeks in Treatment: 4 Active Inactive Electronic Signature(s) Signed: 09/27/2021 9:53:33 AM By: Donnamarie Poag Entered By: Donnamarie Poag on 09/27/2021 09:46:28 Amy Moses ([id number]) -------------------------------------------------------------------------------- Pain Assessment Details Patient Name: Amy Moses Date of Service: 09/27/2021 9:30 AM Medical Record Number: [id number] Patient Account Number: [id number] Date of Birth/Sex: [birth year] (50 y.o. F) Treating RN: Donnamarie Poag Primary Care Gladyce Mcray: Wynelle Cleveland Other Clinician: Referring Anelle Parlow: Wynelle Cleveland Treating Stanlee Roehrig/Extender: Skipper Cliche in Treatment: 42 Active Problems Location of Pain Severity and Description of Pain Patient Has Paino No Site Locations Rate the pain. Current Pain Level: 0 Pain Management and Medication Current Pain Management: Electronic Signature(s) Signed: 09/27/2021 9:53:33 AM By: Donnamarie Poag Entered By: Donnamarie Poag on 09/27/2021 09:37:26 Amy Moses ([id number]) -------------------------------------------------------------------------------- Patient/Caregiver Education Details Patient Name: Amy Moses Date of Service: 09/27/2021 9:30 AM Medical Record  Number: [id number] Patient Account Number: [id number] Date of Birth/Gender: [birth year] (50 y.o. F) Treating RN: Donnamarie Poag Primary Care Physician: Wynelle Cleveland Other Clinician: Referring Physician: Wynelle Cleveland Treating Physician/Extender: Skipper Cliche in Treatment: 66 Education Assessment Education Provided To: Patient Education Topics Provided Basic Hygiene: Nutrition: Offloading: Wound/Skin Impairment: Engineer, maintenance) Signed: 09/27/2021 1:59:03 PM By: Donnamarie Poag Entered By: Donnamarie Poag on 09/27/2021 10:08:32 Amy Moses  ([id number]) -------------------------------------------------------------------------------- Wound Assessment Details Patient Name: Amy Moses Date of Service: 09/27/2021 9:30 AM Medical Record Number: [id number] Patient Account Number: [id number] Date of Birth/Sex: Mar 11, 1971 (50 y.o. F) Treating RN: Donnamarie Poag Primary Care Nevah Dalal: Wynelle Cleveland Other Clinician: Referring Mykah Bellomo: Wynelle Cleveland Treating Kayveon Lennartz/Extender: Skipper Cliche in Treatment: 42 Wound Status Wound Number: 11 Primary Calciphylaxis Etiology: Wound Location: Right, Distal, Posterior Lower Leg Wound Open Wounding Event: Gradually Appeared Status: Date Acquired: 03/21/2021 Comorbid Cataracts, Glaucoma, Anemia, Hypertension, Type II Weeks Of Treatment: 25 History: Diabetes, End Stage Renal Disease, Neuropathy Clustered Wound: No Wound Measurements % Reduction in Area: % Reduction in Volume: Wound Description Classification: Full Thickness Without Exposed Support Structu Exudate Amount: Medium Exudate Type: Serosanguineous Exudate Color: red, brown res Assessment Notes unable to assess as wound care to bilateral lower legs con't to be done by ortho/Dr. [name] per pt-observe bilateral compression wrap to both lower legs Treatment Notes Wound #11 (Lower Leg) Wound Laterality: Right, Posterior, Distal Cleanser Peri-Wound Care Topical Primary Dressing Secondary Dressing Secured With Compression Wrap Compression Stockings Add-Ons Electronic Signature(s) Signed: 09/27/2021 9:53:33 AM By: Donnamarie Poag Entered By: Donnamarie Poag on 09/27/2021 09:45:13 Amy Moses ([id number]) -------------------------------------------------------------------------------- Wound Assessment Details Patient Name: Amy Moses Date of Service: 09/27/2021 9:30 AM Medical Record Number: [id number] Patient Account Number: [id number] Date of Birth/Sex: Jan 25, 1971 (50 y.o. F) Treating RN: Donnamarie Poag Primary  Care Deaundre Allston: Wynelle Cleveland Other Clinician: Referring Billee Balcerzak: Wynelle Cleveland Treating Neylan Koroma/Extender: Skipper Cliche in Treatment: 42 Wound Status Wound Number: 16 Primary Pressure Ulcer Etiology: Wound Location: Left, Distal, Plantar Foot Wound Open Wounding Event: Pressure Injury Status: Date Acquired: 07/23/2021 Comorbid Cataracts, Glaucoma, Anemia, Hypertension, Type II Weeks Of Treatment: 9 History: Diabetes, End Stage Renal Disease, Neuropathy Clustered Wound: No Wound Measurements % Reduction in Area: % Reduction in Volume: Wound Description Classification: Unstageable/Unclassified Exudate Amount: None Present Assessment Notes unable to assess as wound care to bilateral lower legs con't to be done by ortho/Dr. [name] per pt-observe bilateral compression wrap to both lower legs Treatment Notes Wound #16 (Foot) Wound Laterality: Plantar, Left, Distal Cleanser Peri-Wound Care Topical Primary Dressing Secondary Dressing Secured With Compression Wrap Compression Stockings Add-Ons Electronic Signature(s) Signed: 09/27/2021 9:53:33 AM By: Donnamarie Poag Entered By: Donnamarie Poag on 09/27/2021 09:45:30 Amy Moses ([id number]) -------------------------------------------------------------------------------- Wound Assessment Details Patient Name: Amy Moses Date of Service: 09/27/2021 9:30 AM Medical Record Number: [id number] Patient Account Number: [id number] Date of Birth/Sex: 12-Sep-1971 (50 y.o. F) Treating RN: Donnamarie Poag Primary Care Amon Costilla: Wynelle Cleveland Other Clinician: Referring Yandel Zeiner: Wynelle Cleveland Treating Kleo Dungee/Extender: Jeri Cos Weeks in Treatment: 42 Wound Status Wound Number: 18 Primary Abrasion Etiology: Wound Location: Left, Lateral, Posterior Upper Leg Wound Healed - Epithelialized Wounding Event: Shear/Friction Status: Date Acquired: 08/23/2021 Notes: stated it started with shearing from tape removal Weeks Of  Treatment: 2 Comorbid Cataracts, Glaucoma, Anemia, Hypertension, Type II Clustered Wound: No History: Diabetes, End Stage Renal Disease, Neuropathy Photos Wound Measurements Length: (cm) 0 Width: (cm) 0 Depth: (cm) 0 Area: (cm) Volume: (cm) % Reduction in Area: 100% % Reduction in Volume: 100% Tunneling: No 0 Undermining:  No 0 Wound Description Classification: Full Thickness Without Exposed Support Structures Exudate Amount: None Present Foul Odor After Cleansing: No Slough/Fibrino No Wound Bed Granulation Amount: None Present (0%) Exposed Structure Necrotic Amount: None Present (0%) Fascia Exposed: No Fat Layer (Subcutaneous Tissue) Exposed: No Tendon Exposed: No Muscle Exposed: No Joint Exposed: No Bone Exposed: No Electronic Signature(s) Signed: 09/27/2021 1:59:03 PM By: Donnamarie Poag Previous Signature: 09/27/2021 9:53:33 AM Version By: Donnamarie Poag Entered By: Donnamarie Poag on 09/27/2021 09:56:23 Amy Moses ([id number]) -------------------------------------------------------------------------------- Wound Assessment Details Patient Name: Amy Moses Date of Service: 09/27/2021 9:30 AM Medical Record Number: [id number] Patient Account Number: [id number] Date of Birth/Sex: 1971/08/06 (50 y.o. F) Treating RN: Donnamarie Poag Primary Care Willeen Novak: Wynelle Cleveland Other Clinician: Referring Ishika Chesterfield: Wynelle Cleveland Treating Ara Mano/Extender: Skipper Cliche in Treatment: 42 Wound Status Wound Number: 2 Primary Calciphylaxis Etiology: Wound Location: Left, Medial Upper Leg Wound Open Wounding Event: Gradually Appeared Status: Date Acquired: 07/22/2020 Comorbid Cataracts, Glaucoma, Anemia, Hypertension, Type II Weeks Of Treatment: 42 History: Diabetes, End Stage Renal Disease, Neuropathy Clustered Wound: No Photos Wound Measurements Length: (cm) 0.3 Width: (cm) 0.2 Depth: (cm) 0.1 Area: (cm) 0.047 Volume: (cm) 0.005 % Reduction in Area: 100% %  Reduction in Volume: 100% Epithelialization: None Tunneling: Yes Position (o'clock): 3 Maximum Distance: (cm) 2.4 Undermining: No Wound Description Classification: Full Thickness Without Exposed Support Structu Exudate Amount: Medium Exudate Type: Serosanguineous Exudate Color: red, brown res Foul Odor After Cleansing: No Slough/Fibrino Yes Wound Bed Granulation Amount: Medium (34-66%) Exposed Structure Granulation Quality: Red, Pink Fascia Exposed: No Necrotic Amount: Medium (34-66%) Fat Layer (Subcutaneous Tissue) Exposed: Yes Necrotic Quality: Adherent Slough Tendon Exposed: No Muscle Exposed: No Joint Exposed: No Bone Exposed: No Treatment Notes Wound #2 (Upper Leg) Wound Laterality: Left, Medial Cleanser Soap and Water AFSA, MEANY ([id number]) Discharge Instruction: Gently cleanse wound with antibacterial soap, rinse and pat dry prior to dressing wounds Peri-Wound Care Topical Primary Dressing Hydrofera Blue Classic Foam Rope Dressing, 9x6 (mm/in) Discharge Instruction: Cut in 1/4-CUT ONLY TO 3.5CM DO NOT OVERPACKmoisten with saline apply to wounds-cut to fit Secondary Dressing Zetuvit Plus Silicone Border Dressing 4x4 (in/in) Secured With Compression Wrap Compression Stockings Add-Ons Electronic Signature(s) Signed: 09/27/2021 9:53:33 AM By: Donnamarie Poag Entered By: Donnamarie Poag on 09/27/2021 09:42:37 Amy Moses ([id number]) -------------------------------------------------------------------------------- Wound Assessment Details Patient Name: Amy Moses Date of Service: 09/27/2021 9:30 AM Medical Record Number: [id number] Patient Account Number: [id number] Date of Birth/Sex: 15-Feb-1971 (50 y.o. F) Treating RN: Donnamarie Poag Primary Care Negin Hegg: Wynelle Cleveland Other Clinician: Referring Anysia Choi: Wynelle Cleveland Treating Dow Blahnik/Extender: Skipper Cliche in Treatment: 42 Wound Status Wound Number: 4 Primary Pressure Ulcer Etiology: Wound  Location: Right Calcaneus Wound Open Wounding Event: Gradually Appeared Status: Date Acquired: 10/21/2020 Comorbid Cataracts, Glaucoma, Anemia, Hypertension, Type II Weeks Of Treatment: 42 History: Diabetes, End Stage Renal Disease, Neuropathy Clustered Wound: No Wound Measurements % Reduction in Area: % Reduction in Volume: Wound Description Classification: Unstageable/Unclassified Exudate Amount: Large Exudate Type: Serosanguineous Exudate Color: red, brown Assessment Notes unable to assess as wound care to bilateral lower legs con't to be done by ortho/Dr. [name] per pt-observe bilateral compression wrap to both lower legs Treatment Notes Wound #4 (Calcaneus) Wound Laterality: Right Cleanser Peri-Wound Care Topical Primary Dressing Secondary Dressing Secured With Compression Wrap Compression Stockings Add-Ons Electronic Signature(s) Signed: 09/27/2021 9:53:33 AM By: Donnamarie Poag Entered By: Donnamarie Poag on 09/27/2021 09:45:48 Amy Moses ([id number]) -------------------------------------------------------------------------------- Wound Assessment Details Patient Name: Amy Moses Date of Service: 09/27/2021 9:30 AM Medical Record Number: [id number]  Patient Account Number: [id number] Date of Birth/Sex: May 24, 1971 (50 y.o. F) Treating RN: Donnamarie Poag Primary Care Cameo Shewell: Wynelle Cleveland Other Clinician: Referring Arriyanna Mersch: Wynelle Cleveland Treating Rihanna Marseille/Extender: Skipper Cliche in Treatment: 42 Wound Status Wound Number: 9 Primary Calciphylaxis Etiology: Wound Location: Left, Lateral Calf Wound Open Wounding Event: Gradually Appeared Status: Date Acquired: 02/16/2021 Comorbid Cataracts, Glaucoma, Anemia, Hypertension, Type II Weeks Of Treatment: 31 History: Diabetes, End Stage Renal Disease, Neuropathy Clustered Wound: No Wound Measurements % Reduction in Area: % Reduction in Volume: Wound Description Classification: Full Thickness With Exposed  Support Structure Exudate Amount: Large Exudate Type: Serosanguineous Exudate Color: red, brown s Assessment Notes unable to assess as wound care to bilateral lower legs con't to be done by ortho/Dr. [name] per pt-observe bilateral compression wrap to both lower legs Treatment Notes Wound #9 (Calf) Wound Laterality: Left, Lateral Cleanser Peri-Wound Care Topical Primary Dressing Secondary Dressing Secured With Compression Wrap Compression Stockings Add-Ons Electronic Signature(s) Signed: 09/27/2021 9:53:33 AM By: Donnamarie Poag Entered By: Donnamarie Poag on 09/27/2021 09:46:04 Amy Moses ([id number]) -------------------------------------------------------------------------------- Vitals Details Patient Name: Amy Moses Date of Service: 09/27/2021 9:30 AM Medical Record Number: [id number] Patient Account Number: [id number] Date of Birth/Sex: 1970/12/16 (50 y.o. F) Treating RN: Donnamarie Poag Primary Care Nekeya Briski: Wynelle [place] Other Clinician: Referring Bria Portales: Wynelle [place] Treating Ramatoulaye Pack/Extender: Skipper Cliche in Treatment: 44 Vital Signs Time Taken: 09:32 Temperature (F): 97.8 Height (in): 66 Pulse (bpm): 90 Weight (lbs): 235 Respiratory Rate (breaths/min): 16 Body Mass Index (BMI): 37.9 Blood Pressure (mmHg): 154/89 Reference Range: 80 - 120 mg / dl Electronic Signature(s) Signed: 09/27/2021 9:53:33 AM By: Donnamarie Poag Entered ByDonnamarie Poag on 09/27/2021 09:37:19

## 2021-09-27 NOTE — Progress Notes (Addendum)
ZALA, DEGRASSE ([id number]) Visit Report for 09/27/2021 Chief Complaint Document Details Patient Name: Amy Moses, Amy Moses Date of Service: 09/27/2021 9:30 AM Medical Record Number: [id number] Patient Account Number: [id number] Date of Birth/Sex: Jun 03, 1971 (50 y.o. F) Treating RN: Donnamarie Poag Primary Care Provider: Wynelle Cleveland Other Clinician: Referring Provider: Wynelle Cleveland Treating Provider/Extender: Skipper Cliche in Treatment: 42 Information Obtained from: Patient Chief Complaint 12/02/2020; patient is here for review of extensive wounds on her bilateral thighs as well as an area on her right plantar heel Electronic Signature(s) Signed: 09/27/2021 8:53:22 AM By: Worthy Keeler PA-C Entered By: Worthy Keeler on 09/27/2021 08:53:22 Amy Moses ([id number]) -------------------------------------------------------------------------------- HPI Details Patient Name: Amy Moses Date of Service: 09/27/2021 9:30 AM Medical Record Number: [id number] Patient Account Number: [id number] Date of Birth/Sex: 1971/08/29 (50 y.o. F) Treating RN: Donnamarie Poag Primary Care Provider: Wynelle Cleveland Other Clinician: Referring Provider: Wynelle Cleveland Treating Provider/Extender: Skipper Cliche in Treatment: 1 History of Present Illness HPI Description: ADMISSION 12/02/2020 This is a 50 year old woman who is a type II diabetic. Although there are mentions of type 1 diabetes in epic clearly this woman is type II based on the fact that she was on oral agents for 10 years before starting insulin. She also is in chronic renal failure and recently started on dialysis I think in November. She relates her problems starting in September she started to develop skin excoriation on her bilateral inner thighs. She thought this was a friction phenomenon however the tissue wrist can progressively broke down. She was seen by her primary doctor on 10/21/2020 noted to have erythema on both thighs  medially and a blister. MRIs were ordered and she was put on Silvadene and gauze. She was seen in the ER on 12/12 at the [place] [facility] also noted to have a right unstageable heel wound. 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 She dialyzes at [facility] in [facility] and she was started on sodium thiosulfate as far as the notes state. The patient states she gets something at dialysis every day although she has not exactly sure what they are giving her. She was noted by vein and vascular in [place] to have multiple open wounds on 11/19/2020 using Xeroform gauze. She had an angiogram booked by Dr. Oneida Alar predominantly I think because of the right heel ulcer although this was canceled by the patient because of the weather. The patient has large necrotic wounds on both inner thighs with covering black eschar. There is also an area on the left lateral thigh and an eschared area on her right plantar heel. She has been using Betadine to the right heel Xeroform to the areas on her legs. The patient has Medicaid but is able to get the Xeroform, were not really sure how she is managing this although she lives in [place] and there may be different rules for Medicaid in [place] versus [place]. We certainly would not be able to get that here. 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 I wondered whether her neuropathy is particularly affected her sensation of pain since her recent left patella fracture does not seem to have been that painful either Past medical history includes stage V chronic renal failure starting on dialysis I think in November, clearly type 2 diabetes with neuropathy, hypertension, glaucoma, vitamin D deficiency, history of cholecystectomy, edema of both legs, hypothyroidism, she dialyzes Tuesday  Thursday and Saturday at [facility]  [place] The patient had arterial studies on 11/19/2020 I think at vein and vascular in [place]. She had biphasic waveforms throughout the thigh on the right and at the popliteal proximally and distally in the ATA but the PTA and peroneal were not visualized. On the left again biphasic waveforms up to the level of the distal popliteal but not visualized in the tibial vessels. She was felt to have adequate flow where visualized. As noted she was supposed to have an angiogram which I think is certainly indicated 01/21/3556 upon evaluation today patient actually appears to be doing okay in regard to her wounds. This is actually the first time of seeing her she saw Dr. Dellia Nims at the last visit she does have quite an extensive history based on review. With that being said I do not see any signs right now of active infection which is great news. Overall I am extremely pleased with where things stand in that regard. 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 I really feel like she may do better with Dakin's moistened gauze. I do think that we can see about ordering the Dakin's for her she is already using gauze and then wrapping with roll gauze anyway so really would not change much except for moistening some gauze and applying it to the wound bed. With that being said I do not see any signs of active infection at this time which is great news. The patient all in all seems to be in fairly good spirits all things considered. 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 Fortunately it looks like some of the eschar is loosening up so we should be able to get this removed which hopefully will help in speeding up the healing process. With that being said with regard to the patient's  gluteal region she unfortunately has new pressure ulcerations open today which I think could benefit from possibly having a air mattress. With that being said I explained to the patient that unfortunately this could still take some time as far as getting it to heal completely. There does not appear to be any signs of active infection at this time which is great news. No fevers, chills, nausea, vomiting, or diarrhea. 02/16/2021 upon evaluation today patient appears to be doing about the same in regard to her heel. Her gluteal area is actually getting better. She did have surgery in regard to her thighs bilaterally as well as her knee. It was not until she got into the operating room that it was noted that she had the wounds on the thighs. Subsequently according to the surgeons note dated 02/01/2021 they contacted the patient's daughter in order to discuss the fact they felt she needed to have the wound surgically debrided and a wound VAC placed and subsequently instead of patellar reconstruction they opted to proceed with removal of the Portion of the patella in order to get this area to heal more effectively and quickly. Overall that seems to have done quite well. 02/26/2021 upon evaluation today patient's wounds actually appear to be doing excellent pretty much across the board I am very pleased. With that being said the wounds where she has the wound vacs placed on the thighs in particular seems to be doing a very  good job as far as healing is concerned. There does not appear to be any evidence of infection which is great news and overall I am extremely pleased in that regard. No fevers, chills, nausea, vomiting, or diarrhea. 03/12/21 upon evaluation today patient appears to be doing well with regard to her wounds. I think she is doing excellent with the wound VAC and very pleased in that regard. Fortunately there does not appear to be any signs of infection I am that a try to do a little bit of light  debridement today due to some necrotic regions noted in her wounds to try to help speed up the healing process here. 04/05/2021 upon evaluation today patient appears to be doing decently well in general in regard to her wounds. She was unfortunately in the hospital from the 10th through the 12th of this month due to having high fever and subsequently they did not actually determine exactly what was going on they thought she might of just been sick or had some kind of a cold. With that being said fortunately there does not appear to be any evidence of active infection at this time which is great news. No fevers, chills, nausea, vomiting, or diarrhea. ASPEN, LAWRANCE ([id number]) 05/03/2021 upon evaluation today patient actually appears to be doing excellent for the most part in regard to the wounds on her thighs. I am extremely happy with what I see today. In fact I feel like it is probably time for [name] to discontinue the use of the wound VAC as this appears to be doing so well. The patient voiced an understanding and agreement and is definitely happy to get rid of the wound VAC. She was recently in the hospital but this was secondary to something unrelated to her wound she had a polyp which was bleeding and had to be taken care of this was causing significant issues she has since been taken off of her aspirin. 05/21/2021 upon evaluation today patient appears to be doing well with regard to her wound. She has been tolerating the dressing changes without complication with regard to the [facility] in the upper thigh region. We have been using Dakin's moistened gauze on the legs and the heel. Fortunately everything seems to be making good progress here in general which is great news. The unfortunate thing is that we have been having issues here with some necrotic tissue and drainage on the lateral portions of her legs. Fortunately there does not appear to be any signs of systemic infection although  locally there may be a little bit of infection here based on the drainage which is somewhat blue-green in nature. I am going to perform a culture postdebridement of the right calf/lower extremity region. She tolerated that today without complication. I did perform that culture as well. 06/04/2021 upon evaluation today patient appears to be doing well with regard to the wounds on her thighs in general. Fortunately there does not appear to be any signs of active infection which is great news I am very pleased in that regard. In regard to her lower extremities I do feel like that she is making some good progress here as well. This includes the use of the Dakin's moistened gauze which has done a great job as far as keeping the wounds clean at this point. Overall I am extremely happy with where we stand. 06/18/2021 upon evaluation today patient appears to be doing a little better in regard to most of her wounds in general. She  does have a couple areas again and needs some debridement as far as the left lateral leg and the right heel are concerned. Otherwise she also has a small area of fluid on the right medial thigh where this is mostly healed but nonetheless there is a small opening that is of concern here. Fortunately there is no evidence of active infection at this time. No fevers, chills, nausea, vomiting, or diarrhea. 07/08/2021 upon evaluation today patient appears to be doing quite well in regard to her wounds on the right thigh region and left medial thigh region. That is about the extent of what seems to be doing good though everything else is at least the same if not a little bit worse unfortunately. Currently that includes the right lateral thigh where she has pus draining from this area we are probably need to culture this region. There is also some depth to the area where this is draining from. She also has significant wounds on the calf region right and left of the left especially starting to  migrate and looking like it is getting worse. We been using Dakin's on this area. Her heel seems to be doing about the same there is really no significant improvement in general. 07/23/2021 upon evaluation today patient actually appears to be doing quite well in regard to her wounds in general. Most everything is actually showing signs of significant improvement. She did see plastic surgery, Dr. Claudia Desanctis yesterday. With that being said it appears based on what the patient is telling me that he really did not feel there was anything from a surgical standpoint that he had to offer as far as treatment was concerned. Again this was part of the reason that I sent her in order to determine whether or not there was anything that could be done or not. With that being said he did mention making a [facility] to see if there is anything they had to offer but in general it sounds like that is probably not going to be the case. With all that being said the one area that we have an issue is on the left lateral thigh where unfortunately she is having a significant amount of purulent drainage there still some depth here I think we need to pack this area Hydrofera Blue rope will probably do well for her. 08/06/2021 upon evaluation today patient appears to be doing poorly in regard to her left leg in general. Unfortunately I think that she is going require light sharp debridement of the gluteal area but I think that this really is not doing too badly at all. With regard to the left leg however this is significantly worse than what I previously noted. I do believe that in fact the calf region is a lot deeper even than 2 weeks ago quite significantly and subsequently also think that she has a significant infection in the upper part of the thigh on the lateral/hip location where we noted an abscess before I did do a deep wound culture revealed multiple organisms nothing predominant. Subsequently  coupled with the fact that she is having a lot of issues with pain in this area as well and still having purulent pus despite the doxycycline I think that it really has not treated the situation she probably has a deeper abscess that needs to be further managed more aggressively than what I can do in the outpatient setting. 08/20/2021 upon evaluation today the patient actually appears to be doing  much better in regard to her left upper thigh ulceration. In regard to her legs were actually not can be taken care of those at this point as she seen Dr. Pearla Dubonnet and he is managing that currently. The good news is I think that she is doing awesome from the standpoint of the upper leg and in fact has been told by Dr. [name] that her legs lower and the heels are also doing great with good granulation tissue. Obviously this is great news and hopefully the stent in the hospital that she went for has helped tremendously with that as well as have been very concerned about her legs as well to be perfectly honest. Obviously I did not have a chance to view them today and everything in the gluteal area has completely healed. 09/13/2021 upon evaluation today patient appears to be doing well with regard to her lateral upper thigh wound. She has been tolerating the dressing changes without complication. Fortunately there is no signs of active infection at this time. With that being said on one of the medial wounds on the left thigh as well that she has been doing quite well but she has a small area that opened and started draining. With that being said this seems to be not too deep although there was a tunnel towards 4:00 noted when I did clean this a little bit more aggressively. I think that is why this open. Nonetheless I think a small amount of Hydrofera Blue rope would be beneficial as far as helping with allowing this heal from the bottom out. 09/27/2021 upon evaluation today patient appears to be doing quite well in  regard to her wound currently on the lateral portion of her left leg/thigh region. In regard to the medial thigh this is also doing quite well based on what I am seeing. The biggest issue that I see here is simply that the patient is seeming to be packed too tightly with regard to the [facility]. There was much too much of this removed today for what should have been in this region. I discussed that with the patient currently. Nonetheless home health is actually changing this so we just need to make sure to get in touch with them to let them know what needs to be adjusted in that regard. Overall the patient seems to be doing quite well in general which is great news. No fevers, chills, nausea, vomiting, or diarrhea. Electronic Signature(s) Signed: 09/27/2021 10:33:59 AM By: Worthy Keeler PA-C Entered By: Worthy Keeler on 09/27/2021 10:33:59 Amy Moses ([id number]) -------------------------------------------------------------------------------- Physical Exam Details Patient Name: Amy Moses Date of Service: 09/27/2021 9:30 AM Medical Record Number: [id number] Patient Account Number: [id number] Date of Birth/Sex: 1971-08-13 (50 y.o. F) Treating RN: Donnamarie Poag Primary Care Provider: Wynelle Cleveland Other Clinician: Referring Provider: Wynelle Cleveland Treating Provider/Extender: Skipper Cliche in Treatment: 32 Constitutional Well-nourished and well-hydrated in no acute distress. Respiratory normal breathing without difficulty. Psychiatric this patient is able to make decisions and demonstrates good insight into disease process. Alert and Oriented x 3. pleasant and cooperative. Notes Any signs of active infection systemically which is great news. No fevers, chills, nausea, vomiting, or diarrhea. Upon inspection patient's wound bed actually showed signs of good granulation epithelization and actually very pleased with where we stand and I think that the patient is  making good progress here. There does not appear to be Electronic Signature(s) Signed: 09/27/2021 10:34:23 AM By: Worthy Keeler PA-C Entered By: Worthy Keeler  on 09/27/2021 10:34:23 ARIELE, VIDRIO ([id number]) -------------------------------------------------------------------------------- Physician Orders Details Patient Name: Amy Moses Date of Service: 09/27/2021 9:30 AM Medical Record Number: [id number] Patient Account Number: [id number] Date of Birth/Sex: 04/25/1971 (50 y.o. F) Treating RN: Donnamarie Poag Primary Care Provider: Wynelle Cleveland Other Clinician: Referring Provider: Wynelle Cleveland Treating Provider/Extender: Skipper Cliche in Treatment: 46 Verbal / Phone Orders: No Diagnosis Coding ICD-10 Coding Code Description E83.59 Other disorders of calcium metabolism L97.118 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 L97.128 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 L89.610 Pressure ulcer of right heel, unstageable E11.42 Type 2 diabetes mellitus with diabetic polyneuropathy L89.313 Pressure ulcer of right buttock, stage 3 L89.323 Pressure ulcer of left buttock, stage 3 L97.822 Non-pressure chronic ulcer of other part of left lower leg with fat layer exposed L97.812 Non-pressure chronic ulcer of other part of right lower leg with fat layer exposed Follow-up Appointments o Return Appointment in 2 weeks. [place]: - Continue with [facility] for wound care [place], [place]) [place] [phone] Bilateral lower legs handled by Dr. Mitzie Na Care-contact Ortho care for orders for bilateral lower legs HH initiated from hospital d/c o [facility] for wound care. May utilize formulary equivalent dressing for wound treatment orders unless otherwise specified. Home Health Nurse may visit PRN to address patientos wound care needs. o **Please direct any NON-WOUND related  issues/requests for orders to patient's Primary Care Physician. **If current dressing causes regression in wound condition, may D/C ordered dressing product/s and apply Normal Saline Moist Dressing daily until next [place] or Other MD appointment. **Notify W[facility]aling Center of regression in wound condition at [phone]. Edema Control - Lymphedema / Segmental Compressive Device / Other o Elevate leg(s) parallel to the floor when sitting. o DO YOUR BEST to sleep in the bed at night. DO NOT sleep in your recliner. Long hours of sitting in a recliner leads to swelling of the legs and/or potential wounds on your backside. Off-Loading o Gel wheelchair cushion o Air fluidized (Group 3) - stay in the bed except for meals. o Turn and reposition every 2 hours o Other: - keep pressure off of wounded areas. Wound Treatment Wound #2 - Upper Leg Wound Laterality: Left, Medial Cleanser: Soap and Water ([place]) 3 x Per Week/15 Days Discharge Instructions: Gently cleanse wound with antibacterial soap, rinse and pat dry prior to dressing wounds Primary Dressing: Hydrofera Blue Classic Foam Rope Dressing, 9x6 (mm/in) 3 x Per Week/15 Days Discharge Instructions: Cut in 1/4-CUT ONLY TO 3.5CM DO NOT OVERPACKmoisten with saline apply to wounds-cut to fit Secondary Dressing: Zetuvit Plus Silicone Border Dressing 4x4 (in/in) 3 x Per Week/15 Days Notes DO NOT OVERPACK WOUND TUNNEL-CUT HYDROFERA BLUE STRIP IN 1/4 AND ONLY CUT LENGTH TO 3.5CM USE AandD OINTMENT TO DRY SKIN AND NEWLY HEALED AREAS OF LEGS/GLUT KEEP RIGHT LATERAL LEG WOUND COVERED WITH PROTECTIVE DRESSING FOR A WEEK AS IT'S HEALED BRYLINN, TEANEY ([id number]) Electronic Signature(s) Signed: 09/27/2021 1:59:03 PM By: Donnamarie Poag Signed: 09/27/2021 4:18:05 PM By: Worthy Keeler PA-C Entered By: Donnamarie Poag on 09/27/2021 10:07:26 Amy Moses  ([id number]) -------------------------------------------------------------------------------- Problem List Details Patient Name: Amy Moses Date of Service: 09/27/2021 9:30 AM Medical Record Number: [id number] Patient Account Number: [id number] Date of Birth/Sex: [birth year] (50 y.o. F) Treating RN: Donnamarie Poag Primary Care Provider: Wynelle Cleveland Other Clinician: Referring Provider: Wynelle Cleveland Treating Provider/Extender: Skipper Cliche in Treatment: 42 Active Problems ICD-10 Encounter Code Description Active Date  MDM Diagnosis E83.59 Other disorders of calcium metabolism 12/02/2020 No Yes L97.118 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 12/02/2020 No Yes L97.128 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 12/02/2020 No Yes L89.610 Pressure ulcer of right heel, unstageable 12/02/2020 No Yes E11.42 Type 2 diabetes mellitus with diabetic polyneuropathy 12/02/2020 No Yes L89.313 Pressure ulcer of right buttock, stage 3 01/19/2021 No Yes L89.323 Pressure ulcer of left buttock, stage 3 01/19/2021 No Yes L97.822 Non-pressure chronic ulcer of other part of left lower leg with fat layer 05/03/2021 No Yes exposed L97.812 Non-pressure chronic ulcer of other part of right lower leg with fat layer 05/03/2021 No Yes exposed Inactive Problems Resolved Problems Electronic Signature(s) Signed: 09/27/2021 8:53:12 AM By: Worthy Keeler PA-C Entered By: Worthy Keeler on 09/27/2021 08:53:12 Amy Moses ([id number]) -------------------------------------------------------------------------------- Progress Note Details Patient Name: Amy Moses Date of Service: 09/27/2021 9:30 AM Medical Record Number: [id number] Patient Account Number: [id number] Date of Birth/Sex: 1971/11/21 (50 y.o. F) Treating RN: Donnamarie Poag Primary Care Provider: Wynelle Cleveland Other Clinician: Referring Provider: Wynelle Cleveland Treating  Provider/Extender: Skipper Cliche in Treatment: 19 Subjective Chief Complaint Information obtained from Patient 12/02/2020; patient is here for review of extensive wounds on her bilateral thighs as well as an area on her right plantar heel History of Present Illness (HPI) ADMISSION 12/02/2020 This is a 50 year old woman who is a type II diabetic. Although there are mentions of type 1 diabetes in epic clearly this woman is type II based on the fact that she was on oral agents for 10 years before starting insulin. She also is in chronic renal failure and recently started on dialysis I think in November. She relates her problems starting in September she started to develop skin excoriation on her bilateral inner thighs. She thought this was a friction phenomenon however the tissue wrist can progressively broke down. She was seen by her primary doctor on 10/21/2020 noted to have erythema on both thighs medially and a blister. MRIs were ordered and she was put on Silvadene and gauze. She was seen in the ER on 12/12 at the [place] [facility] also noted to have a right unstageable heel wound. 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 She dialyzes at [facility] in [name] and she was started on sodium thiosulfate as far as the notes state. The patient states she gets something at dialysis every day although she has not exactly sure what they are giving her. She was noted by vein and vascular in [place] to have multiple open wounds on 11/19/2020 using Xeroform gauze. She had an angiogram booked by Dr. Oneida Alar predominantly I think because of the right heel ulcer although this was canceled by the patient because of the weather. The patient has large necrotic wounds on both inner thighs with covering black eschar. There is also an area on the left lateral thigh and an eschared area on her right plantar heel. She has been using Betadine to the right  heel Xeroform to the areas on her legs. The patient has Medicaid but is able to get the Xeroform, were not really sure how she is managing this although she lives in [place] and there may be different rules for Medicaid in [place] versus [place]. We certainly would not be able to get that here. 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  I wondered whether her neuropathy is particularly affected her sensation of pain since her recent left patella fracture does not seem to have been that painful either Past medical history includes stage V chronic renal failure starting on dialysis I think in November, clearly type 2 diabetes with neuropathy, hypertension, glaucoma, vitamin D deficiency, history of cholecystectomy, edema of both legs, hypothyroidism, she dialyzes Tuesday Thursday and Saturday at [facility] in [place] The patient had arterial studies on 11/19/2020 I think at vein and vascular in [place]. She had biphasic waveforms throughout the thigh on the right and at the popliteal proximally and distally in the ATA but the PTA and peroneal were not visualized. On the left again biphasic waveforms up to the level of the distal popliteal but not visualized in the tibial vessels. She was felt to have adequate flow where visualized. As noted she was supposed to have an angiogram which I think is certainly indicated 11/26/1094 upon evaluation today patient actually appears to be doing okay in regard to her wounds. This is actually the first time of seeing her she saw Dr. Dellia Nims at the last visit she does have quite an extensive history based on review. With that being said I do not see any signs right now of active infection which is great news. Overall I am extremely pleased with where things stand in that regard. With that being said I do not believe the Xeroform is doing much for the calciphylaxis areas on  her thighs and lateral or medial locations. I really feel like she may do better with Dakin's moistened gauze. I do think that we can see about ordering the Dakin's for her she is already using gauze and then wrapping with roll gauze anyway so really would not change much except for moistening some gauze and applying it to the wound bed. With that being said I do not see any signs of active infection at this time which is great news. The patient all in all seems to be in fairly good spirits all things considered. 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 Fortunately it looks like some of the eschar is loosening up so we should be able to get this removed which hopefully will help in speeding up the healing process. With that being said with regard to the patient's gluteal region she unfortunately has new pressure ulcerations open today which I think could benefit from possibly having a air mattress. With that being said I explained to the patient that unfortunately this could still take some time as far as getting it to heal completely. There does not appear to be any signs of active infection at this time which is great news. No fevers, chills, nausea, vomiting, or diarrhea. 02/16/2021 upon evaluation today patient appears to be doing about the same in regard to her heel. Her gluteal area is actually getting better. She did have surgery in regard to her thighs bilaterally as well as her knee. It was not until she got into the operating room that it was noted that she had the wounds on the thighs. Subsequently according to the surgeons note dated 02/01/2021 they contacted the patient's daughter in order to discuss the fact they felt she needed to have the wound surgically debrided and a wound VAC placed and subsequently instead of patellar reconstruction they opted to proceed  with removal of the Portion of the patella  in order to get this area to heal more effectively and quickly. Overall that seems to have done quite well. 02/26/2021 upon evaluation today patient's wounds actually appear to be doing excellent pretty much across the board I am very pleased. With that being said the wounds where she has the wound vacs placed on the thighs in particular seems to be doing a very good job as far as healing is concerned. There does not appear to be any evidence of infection which is great news and overall I am extremely pleased in that regard. No fevers, chills, nausea, vomiting, or diarrhea. 03/12/21 upon evaluation today patient appears to be doing well with regard to her wounds. I think she is doing excellent with the wound VAC and very pleased in that regard. Fortunately there does not appear to be any signs of infection I am that a try to do a little bit of light debridement today due to some necrotic regions noted in her wounds to try to help speed up the healing process here. JARETZI, DROZ ([id number]) 04/05/2021 upon evaluation today patient appears to be doing decently well in general in regard to her wounds. She was unfortunately in the hospital from the 10th through the 12th of this month due to having high fever and subsequently they did not actually determine exactly what was going on they thought she might of just been sick or had some kind of a cold. With that being said fortunately there does not appear to be any evidence of active infection at this time which is great news. No fevers, chills, nausea, vomiting, or diarrhea. 05/03/2021 upon evaluation today patient actually appears to be doing excellent for the most part in regard to the wounds on her thighs. I am extremely happy with what I see today. In fact I feel like it is probably time for [name] to discontinue the use of the wound VAC as this appears to be doing so well. The patient voiced an understanding and  agreement and is definitely happy to get rid of the wound VAC. She was recently in the hospital but this was secondary to something unrelated to her wound she had a polyp which was bleeding and had to be taken care of this was causing significant issues she has since been taken off of her aspirin. 05/21/2021 upon evaluation today patient appears to be doing well with regard to her wound. She has been tolerating the dressing changes without complication with regard to the [facility] in the upper thigh region. We have been using Dakin's moistened gauze on the legs and the heel. Fortunately everything seems to be making good progress here in general which is great news. The unfortunate thing is that we have been having issues here with some necrotic tissue and drainage on the lateral portions of her legs. Fortunately there does not appear to be any signs of systemic infection although locally there may be a little bit of infection here based on the drainage which is somewhat blue-green in nature. I am going to perform a culture postdebridement of the right calf/lower extremity region. She tolerated that today without complication. I did perform that culture as well. 06/04/2021 upon evaluation today patient appears to be doing well with regard to the wounds on her thighs in general. Fortunately there does not appear to be any signs of active infection which is great news I am very pleased in that regard. In regard to her lower extremities I do feel like that she  is making some good progress here as well. This includes the use of the Dakin's moistened gauze which has done a great job as far as keeping the wounds clean at this point. Overall I am extremely happy with where we stand. 06/18/2021 upon evaluation today patient appears to be doing a little better in regard to most of her wounds in general. She does have a couple areas again and needs some debridement as far as the left lateral leg and the right  heel are concerned. Otherwise she also has a small area of fluid on the right medial thigh where this is mostly healed but nonetheless there is a small opening that is of concern here. Fortunately there is no evidence of active infection at this time. No fevers, chills, nausea, vomiting, or diarrhea. 07/08/2021 upon evaluation today patient appears to be doing quite well in regard to her wounds on the right thigh region and left medial thigh region. That is about the extent of what seems to be doing good though everything else is at least the same if not a little bit worse unfortunately. Currently that includes the right lateral thigh where she has pus draining from this area we are probably need to culture this region. There is also some depth to the area where this is draining from. She also has significant wounds on the calf region right and left of the left especially starting to migrate and looking like it is getting worse. We been using Dakin's on this area. Her heel seems to be doing about the same there is really no significant improvement in general. 07/23/2021 upon evaluation today patient actually appears to be doing quite well in regard to her wounds in general. Most everything is actually showing signs of significant improvement. She did see plastic surgery, Dr. Claudia Desanctis yesterday. With that being said it appears based on what the patient is telling me that he really did not feel there was anything from a surgical standpoint that he had to offer as far as treatment was concerned. Again this was part of the reason that I sent her in order to determine whether or not there was anything that could be done or not. With that being said he did mention making a [facility] in [facility] to see if there is anything they had to offer but in general it sounds like that is probably not going to be the case. With all that being said the one area that we have an issue is on the left lateral  thigh where unfortunately she is having a significant amount of purulent drainage there still some depth here I think we need to pack this area Hydrofera Blue rope will probably do well for her. 08/06/2021 upon evaluation today patient appears to be doing poorly in regard to her left leg in general. Unfortunately I think that she is going require light sharp debridement of the gluteal area but I think that this really is not doing too badly at all. With regard to the left leg however this is significantly worse than what I previously noted. I do believe that in fact the calf region is a lot deeper even than 2 weeks ago quite significantly and subsequently also think that she has a significant infection in the upper part of the thigh on the lateral/hip location where we noted an abscess before I did do a deep wound culture revealed multiple organisms nothing predominant. Subsequently coupled with the fact that she is  having a lot of issues with pain in this area as well and still having purulent pus despite the doxycycline I think that it really has not treated the situation she probably has a deeper abscess that needs to be further managed more aggressively than what I can do in the outpatient setting. 08/20/2021 upon evaluation today the patient actually appears to be doing much better in regard to her left upper thigh ulceration. In regard to her legs were actually not can be taken care of those at this point as she seen Dr. Pearla Dubonnet and he is managing that currently. The good news is I think that she is doing awesome from the standpoint of the upper leg and in fact has been told by Dr. [name] that her legs lower and the heels are also doing great with good granulation tissue. Obviously this is great news and hopefully the stent in the hospital that she went for has helped tremendously with that as well as have been very concerned about her legs as well to be perfectly honest. Obviously I did not have a  chance to view them today and everything in the gluteal area has completely healed. 09/13/2021 upon evaluation today patient appears to be doing well with regard to her lateral upper thigh wound. She has been tolerating the dressing changes without complication. Fortunately there is no signs of active infection at this time. With that being said on one of the medial wounds on the left thigh as well that she has been doing quite well but she has a small area that opened and started draining. With that being said this seems to be not too deep although there was a tunnel towards 4:00 noted when I did clean this a little bit more aggressively. I think that is why this open. Nonetheless I think a small amount of Hydrofera Blue rope would be beneficial as far as helping with allowing this heal from the bottom out. 09/27/2021 upon evaluation today patient appears to be doing quite well in regard to her wound currently on the lateral portion of her left leg/thigh region. In regard to the medial thigh this is also doing quite well based on what I am seeing. The biggest issue that I see here is simply that the patient is seeming to be packed too tightly with regard to the [name]. There was much too much of this removed today for what should have been in this region. I discussed that with the patient currently. Nonetheless home health is actually changing this so we just need to make sure to get in touch with them to let them know what needs to be adjusted in that regard. Overall the patient seems to be doing quite well in general which is great news. No fevers, chills, nausea, vomiting, or diarrhea. JANICA, ELDRED ([id number]) Objective Constitutional Well-nourished and well-hydrated in no acute distress. Vitals Time Taken: 9:32 AM, Height: 66 in, Weight: 235 lbs, BMI: 37.9, Temperature: 97.8 F, Pulse: 90 bpm, Respiratory Rate: 16 breaths/min, Blood Pressure: 154/89 mmHg. Respiratory normal  breathing without difficulty. Psychiatric this patient is able to make decisions and demonstrates good insight into disease process. Alert and Oriented x 3. pleasant and cooperative. General Notes: Any signs of active infection systemically which is great news. No fevers, chills, nausea, vomiting, or diarrhea. Upon inspection patient's wound bed actually showed signs of good granulation epithelization and actually very pleased with where we stand and I think that the patient is making good progress here.  There does not appear to be Integumentary (Hair, Skin) Wound #11 status is Open. Original cause of wound was Gradually Appeared. The date acquired was: 03/21/2021. The wound has been in treatment 25 weeks. The wound is located on the Right,Distal,Posterior Lower Leg. There is a medium amount of serosanguineous drainage noted. General Notes: unable to assess as wound care to bilateral lower legs con't to be done by ortho/Dr. [name] per pt-observe bilateral compression wrap to both lower legs Wound #16 status is Open. Original cause of wound was Pressure Injury. The date acquired was: 07/23/2021. The wound has been in treatment 9 weeks. The wound is located on the [place]. There is a none present amount of drainage noted. General Notes: unable to assess as wound care to bilateral lower legs con't to be done by ortho/Dr. [name] per pt-observe bilateral compression wrap to both lower legs Wound #18 status is Healed - Epithelialized. Original cause of wound was Shear/Friction. The date acquired was: 08/23/2021. The wound has been in treatment 2 weeks. The wound is located on the Left,Lateral,Posterior Upper Leg. The wound measures 0cm length x 0cm width x 0cm depth; 0cm^2 area and 0cm^3 volume. There is no tunneling or undermining noted. There is a none present amount of drainage noted. There is no granulation within the wound bed. There is no necrotic tissue within the wound bed. Wound #2  status is Open. Original cause of wound was Gradually Appeared. The date acquired was: 07/22/2020. The wound has been in treatment 42 weeks. The wound is located on the Left,Medial Upper Leg. The wound measures 0.3cm length x 0.2cm width x 0.1cm depth; 0.047cm^2 area and 0.005cm^3 volume. There is Fat Layer (Subcutaneous Tissue) exposed. There is no undermining noted, however, there is tunneling at 3:00 with a maximum distance of 2.4cm. There is a medium amount of serosanguineous drainage noted. There is medium (34-66%) red, pink granulation within the wound bed. There is a medium (34-66%) amount of necrotic tissue within the wound bed including Adherent Slough. Wound #4 status is Open. Original cause of wound was Gradually Appeared. The date acquired was: 10/21/2020. The wound has been in treatment 42 weeks. The wound is located on the Right Calcaneus. There is a large amount of serosanguineous drainage noted. General Notes: unable to assess as wound care to bilateral lower legs con't to be done by ortho/Dr. [name] per pt-observe bilateral compression wrap to both lower legs Wound #9 status is Open. Original cause of wound was Gradually Appeared. The date acquired was: 02/16/2021. The wound has been in treatment 31 weeks. The wound is located on the Left,Lateral Calf. There is a large amount of serosanguineous drainage noted. General Notes: unable to assess as wound care to bilateral lower legs con't to be done by ortho/Dr. 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 Home Health: [place]: - Continue with [facility] for wound care [place], [place]) FAX [phone] Bilateral lower legs  handled by Dr. Starla Link Ortho care for orders for bilateral lower legs HH initiated from hospital d/c [facility] for wound care. May utilize formulary equivalent dressing for wound treatment orders unless otherwise specified. Home Health Nurse may visit [facility] to address patient s wound care needs. **Please direct any NON-WOUND related issues/requests for orders to patient's Primary Care Physician. **If current dressing causes regression in wound condition, may D/C ordered dressing product/s and apply Normal Saline Moist Dressing daily until next [place] or Other MD appointment. **Notify [facility] of regression in wound condition at [phone]. Edema Control - Lymphedema / Segmental Compressive Device / Other: Elevate leg(s) parallel to the floor when sitting. DO YOUR BEST to sleep in the bed at night. DO NOT sleep in your recliner. Long hours of sitting in a recliner leads to swelling of the legs and/or potential wounds on your backside. Off-Loading: Gel wheelchair cushion Air fluidized (Group 3) - stay in the bed except for meals. Turn and reposition every 2 hours Other: - keep pressure off of wounded areas. General Notes: DO NOT OVERPACK WOUND TUNNEL-CUT HYDROFERA BLUE STRIP IN 1/4 AND ONLY CUT LENGTH TO 3.5CM USE AandD OINTMENT TO DRY SKIN AND NEWLY HEALED AREAS OF LEGS/GLUT KEEP RIGHT LATERAL LEG WOUND COVERED WITH PROTECTIVE DRESSING FOR A WEEK AS IT'S HEALED WOUND #2: - Upper Leg Wound Laterality: Left, Medial Cleanser: Soap and Water (Home Health) 3 x Per Week/15  Days Discharge Instructions: Gently cleanse wound with antibacterial soap, rinse and pat dry prior to dressing wounds Primary Dressing: Hydrofera Blue Classic Foam Rope Dressing, 9x6 (mm/in) 3 x Per Week/15 Days Discharge Instructions: Cut in 1/4-CUT ONLY TO 3.5CM DO NOT OVERPACKmoisten with saline apply to wounds-cut to fit Secondary Dressing: Zetuvit Plus Silicone Border Dressing 4x4 (in/in) 3 x Per Week/15 Days 1. Would recommend that we going continue with the wound care measures as before and the patient is in agreement the plan this includes the use of the Hydrofera Blue rope although this should be cut to 3.5 cm and only packed and 2.5 at most. This does not go any deeper than that. 2. Also can recommend that we have the patient continue with the Zetuvit border foam dressings to cover. I think this is doing a good job. 3. With regard to the lateral wound this is healed although we will continue to use just a protective cover dressing over top of this. We will see patient back for reevaluation in 2 weeks here in the clinic. If anything worsens or changes patient will contact our office for additional recommendations. Electronic Signature(s) Signed: 09/27/2021 10:35:02 AM By: Worthy Keeler PA-C Entered By: Worthy Keeler on 09/27/2021 10:35:02 Amy Moses ([id number]) -------------------------------------------------------------------------------- SuperBill Details Patient Name: Amy Moses Date of Service: 09/27/2021 Medical Record Number: [id number] Patient Account Number: [id number] Date of Birth/Sex: 1971-09-10 (50 y.o. F) Treating RN: Donnamarie Poag Primary Care Provider: Wynelle Cleveland Other Clinician: Referring Provider: Wynelle Cleveland Treating Provider/Extender: Skipper Cliche in Treatment: 42 Diagnosis Coding ICD-10 Codes Code Description E83.59 Other disorders of calcium metabolism L97.118 Non-pressure chronic ulcer of right thigh with other specified  severity L97.128 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 L89.610 Pressure ulcer of right heel, unstageable E11.42 Type 2 diabetes mellitus with diabetic polyneuropathy L89.313 Pressure ulcer of right buttock, stage 3 L89.323 Pressure ulcer of left buttock, stage 3 L97.822 Non-pressure chronic ulcer of other part of left lower leg with fat layer exposed L97.812 Non-pressure  chronic ulcer of other part of right lower leg with fat layer exposed Facility Procedures CPT4 Code: 62836629 Description: [place] VISIT-LEV 3 EST PT Modifier: Quantity: 1 Physician Procedures CPT4 Code: 4765465 Description: 03546 - WC PHYS LEVEL 4 - EST PT Modifier: Quantity: 1 CPT4 Code: Description: ICD-10 Diagnosis Description E83.59 Other disorders of calcium metabolism L97.118 Non-pressure chronic ulcer of right thigh with other specified severit L97.128 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 L89.610  Pressure ulcer of right heel, unstageable Modifier: y Quantity: Electronic Signature(s) Signed: 09/27/2021 10:35:19 AM By: Worthy Keeler PA-C Entered By: Worthy Keeler on 09/27/2021 10:35:18

## 2021-09-28 ENCOUNTER — Ambulatory Visit (INDEPENDENT_AMBULATORY_CARE_PROVIDER_SITE_OTHER): Payer: Medicare Other | Admitting: Orthopedic Surgery

## 2021-09-28 ENCOUNTER — Encounter: Payer: Self-pay | Admitting: Orthopedic Surgery

## 2021-09-28 DIAGNOSIS — L97923 Non-pressure chronic ulcer of unspecified part of left lower leg with necrosis of muscle: Secondary | ICD-10-CM | POA: Diagnosis not present

## 2021-09-28 DIAGNOSIS — L97911 Non-pressure chronic ulcer of unspecified part of right lower leg limited to breakdown of skin: Secondary | ICD-10-CM

## 2021-09-28 NOTE — Progress Notes (Signed)
Office Visit Note   Patient: Amy Moses           Date of Birth: October 13, 1971           MRN: [id number] Visit Date: 09/28/2021              Requested by: Wynelle Cleveland, MD No address on file PCP: Wynelle Cleveland, MD  Chief Complaint  Patient presents with   Left Leg - Routine Post Op   Right Leg - Routine Post Op      HPI: Patient is a 50 year old woman who presents in follow-up for calciphylaxis ulcerations both lower extremities she has undergone skin grafting 3 weeks ago.  Currently undergoing twice a week compression wraps.  Assessment & Plan: Visit Diagnoses:  1. Calciphylaxis of right lower extremity with nonhealing ulcer, limited to breakdown of skin ([place])   2. Calciphylaxis of left lower extremity with nonhealing ulcer with necrosis of muscle ([place])     Plan: Continue with twice a week compression wraps MD visits on Tuesday and nurse visits on Friday.  Follow-Up Instructions: Return in about 1 week (around 10/05/2021) for Follow-up every Tuesday and Friday, nurse visit on Fridays.   Ortho Exam  Patient is alert, oriented, no adenopathy, well-dressed, normal affect, normal respiratory effort. Examination patient has healthy granulation tissue on the wounds of the posterior aspects of both calfs.  There is decreased bleeding no cellulitis no odor no signs of infection.  Imaging: No results found. No images are attached to the encounter.  Labs: Lab Results  Component Value Date   HGBA1C 9.5 (H) 08/07/2021   HGBA1C 7.3 (H) 03/30/2021   HGBA1C 7.4 (H) 06/10/2020   ESRSEDRATE 40 (H) 08/07/2021   CRP 11.1 (H) 08/07/2021   REPTSTATUS 08/12/2021 FINAL 08/07/2021   GRAMSTAIN  07/08/2021    RARE WBC PRESENT,BOTH PMN AND MONONUCLEAR RARE GRAM POSITIVE RODS RARE GRAM POSITIVE COCCI IN PAIRS    CULT  08/07/2021    NO GROWTH 5 DAYS Performed at [facility], [place] [address], [place],  [address]    LABORGA CITROBACTER KOSERI 05/21/2021   LABORGA  PROTEUS MIRABILIS 05/21/2021     Lab Results  Component Value Date   ALBUMIN 2.7 (L) 09/04/2021   ALBUMIN 2.4 (L) 08/12/2021   ALBUMIN 2.1 (L) 08/10/2021   PREALBUMIN 13.7 (L) 08/07/2021    Lab Results  Component Value Date   MG 2.1 05/14/2021   MG 2.8 (H) 05/11/2021   MG 2.3 04/23/2021   No results found for: VD25OH  Lab Results  Component Value Date   PREALBUMIN 13.7 (L) 08/07/2021   CBC EXTENDED Latest Ref Rng & Units 09/04/2021 09/03/2021 08/12/2021  WBC 4.0 - 10.5 K/uL 8.9 - 8.1  RBC 3.87 - 5.11 MIL/uL 3.49(L) - 4.21  HGB 12.0 - 15.0 g/dL 8.7(L) 11.2(L) 10.1(L)  HCT 36.0 - 46.0 % 29.8(L) 33.0(L) 33.4(L)  PLT 150 - 400 K/uL 276 - 294  NEUTROABS 1.7 - 7.7 K/uL - - -  LYMPHSABS 0.7 - 4.0 K/uL - - -     There is no height or weight on file to calculate BMI.  Orders:  No orders of the defined types were placed in this encounter.  No orders of the defined types were placed in this encounter.    Procedures: No procedures performed  Clinical Data: No additional findings.  ROS:  All other systems negative, except as noted in the HPI. Review of Systems  Objective: Vital Signs: LMP 08/22/2015 (Approximate)  Specialty Comments:  No specialty comments available.  PMFS History: Patient Active Problem List   Diagnosis Date Noted   Open leg wound 09/03/2021   Wound infection    Non-pressure chronic ulcer of right calf limited to breakdown of skin ([place])    Calciphylaxis of right lower extremity with nonhealing ulcer, limited to breakdown of skin ([place])    Chronic ulcer of left thigh ([place]) 08/07/2021   Calciphylaxis of left lower extremity with nonhealing ulcer with necrosis of muscle ([place]) 08/07/2021   Lower GI bleed 05/12/2021   Blindness 05/10/2021   Shingles 05/10/2021   Acute GI bleeding 04/24/2021   Acute blood loss anemia 04/23/2021   GI bleed 04/22/2021   Fever 03/31/2021   Acute on chronic heart failure with preserved ejection fraction (HFpEF)  ([place]) 03/30/2021   Pressure injury of skin 03/30/2021   End stage renal disease ([place]) 11/16/2020   Calciphylaxis 11/06/2020   Non-healing open wound of heel 11/03/2020   Diabetic foot infection ([place]) 11/01/2020   Decubitus ulcer, heel 11/01/2020   Closed nondisplaced fracture of left patella 20/25/4270   Metabolic acidosis 62/37/6283   Acute on chronic renal failure ([place]) 06/10/2020   Symptomatic anemia 06/10/2020   Acute pericardial effusion 06/10/2020   Other acute nonsuppurative otitis media, unspecified ear 11/29/2019   Chronic kidney disease, stage 4 (severe) ([place]) 03/05/2019   Skin ulcer, limited to breakdown of skin ([place]) 01/28/2019   Vitamin D deficiency 01/28/2019   Uncontrolled type 2 diabetes mellitus with chronic kidney disease, with long-term current use of insulin 09/21/2015   Hyperlipidemia 09/21/2015   Essential hypertension, benign 09/21/2015   Primary hypothyroidism 09/21/2015   Iris bomb 07/31/2012   Secondary angle-closure glaucoma 07/31/2012   Past Medical History:  Diagnosis Date   Anemia    Blindness of right eye with low vision in contralateral eye    s/p victrectomy   Diabetes mellitus, type II ([place])    Dyslipidemia    Glaucoma    Hypertension    Hypothyroidism (acquired)    Kidney disease    Stage 5   Pneumonia     Family History  Problem Relation Age of Onset   Heart disease Mother    Diabetes Mother    Kidney disease Mother    Diabetes Father    Heart disease Father    Diabetes Brother    Colon cancer Neg Hx     Past Surgical History:  Procedure Laterality Date   ABDOMINAL AORTOGRAM W/LOWER EXTREMITY Bilateral 12/18/2020   Procedure: ABDOMINAL AORTOGRAM W/LOWER EXTREMITY;  Surgeon: Elam Dutch, MD;  Location: [facility];  Service: Cardiovascular;  Laterality: Bilateral;   ANKLE FRACTURE SURGERY Right    AV FISTULA PLACEMENT Left 08/18/2020   Procedure: LEFT ARM BRACHIOCEPHALIC ARTERIOVENOUS (AV) FISTULA CREATION;   Surgeon: Elam Dutch, MD;  Location: [facility];  Service: Vascular;  Laterality: Left;   BIOPSY  04/24/2021   Procedure: BIOPSY;  Surgeon: Eloise Harman, DO;  [facility];  Service: Endoscopy;;   CESAREAN SECTION     CHOLECYSTECTOMY     COLONOSCOPY  04/24/2021   Surgeon: Eloise Harman, DO;  nonbleeding internal hemorrhoids, 1 large (25 mm) pedunculated transverse colon polyp (prolapse type polyp) with adherent clot and stigmata of recent bleed.   COLONOSCOPY WITH PROPOFOL N/A 05/14/2021   Procedure: COLONOSCOPY WITH PROPOFOL;  Surgeon: Daneil Dolin, MD;  [facility];  Service: Endoscopy;  Laterality: N/A;   ESOPHAGOGASTRODUODENOSCOPY (EGD) WITH PROPOFOL  N/A 04/24/2021   Surgeon: Eloise Harman, DO;  duodenal erosions and gastritis biopsied (pathology with peptic duodenitis, reactive gastropathy with erosions/chronic inflammation, negative for H. pylori)   EYE SURGERY     Vatrectomy   HEMOSTASIS CLIP PLACEMENT  05/14/2021   Procedure: HEMOSTASIS CLIP PLACEMENT;  Surgeon: Daneil Dolin, MD;  [facility];  Service: Endoscopy;;   IR PERC TUN PERIT CATH WO PORT S&I /IMAG  09/15/2020   IR REMOVAL TUN CV CATH W/O FL  02/19/2021   IR US GUIDE VASC ACCESS RIGHT  09/15/2020   POLYPECTOMY  04/24/2021   Procedure: POLYPECTOMY;  Surgeon: Eloise Harman, DO;  [facility];  Service: Endoscopy;;   POLYPECTOMY  05/14/2021   Procedure: POLYPECTOMY;  Surgeon: Daneil Dolin, MD;  [facility];  Service: Endoscopy;;   SKIN SPLIT GRAFT Bilateral 09/03/2021   Procedure: SKIN GRAFT BILATERAL LEGS;  Surgeon: Newt Minion, MD;  Location: [facility];  Service: Orthopedics;  Laterality: Bilateral;   TOE SURGERY     Social History   Occupational History   Not on file  Tobacco Use   Smoking status: Never   Smokeless tobacco: Never  Vaping Use   Vaping Use: Never used  Substance and Sexual Activity   Alcohol use: No   Drug use: No    Sexual activity: Yes    Birth control/protection: Condom

## 2021-09-29 ENCOUNTER — Telehealth: Payer: Self-pay

## 2021-09-29 NOTE — Telephone Encounter (Signed)
Joy from the wound center in [place] would like a CB to discuss pt's wound care treatment. She stated they are currently treating pts upper leg wound but would like Dr. [name] to take over care for the whole leg since he is already treating the pts bilateral lower leg. Please cb to discuss @ [phone]

## 2021-09-29 NOTE — Telephone Encounter (Signed)
I called and advised that yes we would be happy to care for the upper left thigh wound. They are currently using hydrofera blue and advised will send office notes. The pt has been having appts here twice a week and then also at the wound center for the left thigh advised we will care for all leg wounds.

## 2021-09-30 ENCOUNTER — Ambulatory Visit: Payer: Medicare Other | Admitting: Orthopedic Surgery

## 2021-10-01 ENCOUNTER — Ambulatory Visit (INDEPENDENT_AMBULATORY_CARE_PROVIDER_SITE_OTHER): Payer: Medicare Other | Admitting: Family

## 2021-10-01 ENCOUNTER — Other Ambulatory Visit: Payer: Self-pay

## 2021-10-01 DIAGNOSIS — L97911 Non-pressure chronic ulcer of unspecified part of right lower leg limited to breakdown of skin: Secondary | ICD-10-CM

## 2021-10-01 DIAGNOSIS — L97923 Non-pressure chronic ulcer of unspecified part of left lower leg with necrosis of muscle: Secondary | ICD-10-CM

## 2021-10-04 ENCOUNTER — Other Ambulatory Visit: Payer: Self-pay

## 2021-10-04 ENCOUNTER — Ambulatory Visit (INDEPENDENT_AMBULATORY_CARE_PROVIDER_SITE_OTHER): Payer: Medicare Other | Admitting: Orthopedic Surgery

## 2021-10-04 ENCOUNTER — Telehealth: Payer: Self-pay | Admitting: Orthopedic Surgery

## 2021-10-04 ENCOUNTER — Ambulatory Visit: Payer: Medicare Other | Admitting: Orthopedic Surgery

## 2021-10-04 DIAGNOSIS — L97911 Non-pressure chronic ulcer of unspecified part of right lower leg limited to breakdown of skin: Secondary | ICD-10-CM | POA: Diagnosis not present

## 2021-10-04 NOTE — Telephone Encounter (Signed)
Pt called stating she forgot to schedule transportation for her appt today and would like to know if she can be squeezed in tomorrow with Dr.Duda. Pt would like a CB.   [phone]

## 2021-10-04 NOTE — Telephone Encounter (Signed)
Pt will come in today at 3pm

## 2021-10-05 NOTE — Progress Notes (Signed)
Office Visit Note   Patient: Amy Moses           Date of Birth: 1971/08/26           MRN: [id number] Visit Date: 09/28/2021              Requested by: Wynelle Cleveland, MD No address on file PCP: Wynelle Cleveland, MD  Chief Complaint  Patient presents with   Left Leg - Routine Post Op   Right Leg - Routine Post Op      HPI: Patient is a 50 year old woman who presents in follow-up for calciphylaxis ulcerations both lower extremities she has undergone skin grafting on October 14.  Currently undergoing twice a week compression wraps.  Assessment & Plan: Visit Diagnoses:  1. Calciphylaxis of right lower extremity with nonhealing ulcer, limited to breakdown of skin ([place])   2. Calciphylaxis of left lower extremity with nonhealing ulcer with necrosis of muscle ([place])     Plan: Continue with twice a week compression wraps.  Follow-Up Instructions: Return in about 1 week (around 10/05/2021) for Follow-up every Tuesday and Friday, nurse visit on Fridays.   Ortho Exam  Patient is alert, oriented, no adenopathy, well-dressed, normal affect, normal respiratory effort.  On examination bilateral lower extremities there is good wrinkling of the skin from compression.  She does have healthy granulation to the her wound.  The right posterior calf ulcer is filled in with 100% granulation this is bleeding and healthy there is no surrounding erythema maceration no sign of infection there is epithelialization circumferentially.  The left lower extremity has the largest ulcer posteriorly this is again filled in with 100% granulation with good uptake through the graft.  There is a nickel sized area of graft material seen centrally this is still stapled in place.  Will have circumferential staples removed from grafts today.  No odor no warmth no erythema  Imaging: No results found. No images are attached to the encounter.  Labs: Lab Results  Component Value Date   HGBA1C 9.5 (H) 08/07/2021    HGBA1C 7.3 (H) 03/30/2021   HGBA1C 7.4 (H) 06/10/2020   ESRSEDRATE 40 (H) 08/07/2021   CRP 11.1 (H) 08/07/2021   REPTSTATUS 08/12/2021 FINAL 08/07/2021   GRAMSTAIN  07/08/2021    RARE WBC PRESENT,BOTH PMN AND MONONUCLEAR RARE GRAM POSITIVE RODS RARE GRAM POSITIVE COCCI IN PAIRS    CULT  08/07/2021    NO GROWTH 5 DAYS Performed at [facility], [place] [address], [place], [place] [address]    LABORGA CITROBACTER KOSERI 05/21/2021   LABORGA PROTEUS MIRABILIS 05/21/2021     Lab Results  Component Value Date   ALBUMIN 2.7 (L) 09/04/2021   ALBUMIN 2.4 (L) 08/12/2021   ALBUMIN 2.1 (L) 08/10/2021   PREALBUMIN 13.7 (L) 08/07/2021    Lab Results  Component Value Date   MG 2.1 05/14/2021   MG 2.8 (H) 05/11/2021   MG 2.3 04/23/2021   No results found for: VD25OH  Lab Results  Component Value Date   PREALBUMIN 13.7 (L) 08/07/2021   CBC EXTENDED Latest Ref Rng & Units 09/04/2021 09/03/2021 08/12/2021  WBC 4.0 - 10.5 K/uL 8.9 - 8.1  RBC 3.87 - 5.11 MIL/uL 3.49(L) - 4.21  HGB 12.0 - 15.0 g/dL 8.7(L) 11.2(L) 10.1(L)  HCT 36.0 - 46.0 % 29.8(L) 33.0(L) 33.4(L)  PLT 150 - 400 K/uL 276 - 294  NEUTROABS 1.7 - 7.7 K/uL - - -  LYMPHSABS 0.7 - 4.0 K/uL - - -  There is no height or weight on file to calculate BMI.  Orders:  No orders of the defined types were placed in this encounter.  No orders of the defined types were placed in this encounter.    Procedures: No procedures performed  Clinical Data: No additional findings.  ROS:  All other systems negative, except as noted in the HPI. Review of Systems  Objective: Vital Signs: LMP 08/22/2015 (Approximate)   Specialty Comments:  No specialty comments available.  PMFS History: Patient Active Problem List   Diagnosis Date Noted   Open leg wound 09/03/2021   Wound infection    Non-pressure chronic ulcer of right calf limited to breakdown of skin ([place])    Calciphylaxis of right lower extremity with  nonhealing ulcer, limited to breakdown of skin ([place])    Chronic ulcer of left thigh ([place]) 08/07/2021   Calciphylaxis of left lower extremity with nonhealing ulcer with necrosis of muscle ([place]) 08/07/2021   Lower GI bleed 05/12/2021   Blindness 05/10/2021   Shingles 05/10/2021   Acute GI bleeding 04/24/2021   Acute blood loss anemia 04/23/2021   GI bleed 04/22/2021   Fever 03/31/2021   Acute on chronic heart failure with preserved ejection fraction (HFpEF) ([place]) 03/30/2021   Pressure injury of skin 03/30/2021   End stage renal disease ([place]) 11/16/2020   Calciphylaxis 11/06/2020   Non-healing open wound of heel 11/03/2020   Diabetic foot infection ([place]) 11/01/2020   Decubitus ulcer, heel 11/01/2020   Closed nondisplaced fracture of left patella 16/38/4536   Metabolic acidosis 46/80/3212   Acute on chronic renal failure ([place]) 06/10/2020   Symptomatic anemia 06/10/2020   Acute pericardial effusion 06/10/2020   Other acute nonsuppurative otitis media, unspecified ear 11/29/2019   Chronic kidney disease, stage 4 (severe) ([place]) 03/05/2019   Skin ulcer, limited to breakdown of skin ([place]) 01/28/2019   Vitamin D deficiency 01/28/2019   Uncontrolled type 2 diabetes mellitus with chronic kidney disease, with long-term current use of insulin 09/21/2015   Hyperlipidemia 09/21/2015   Essential hypertension, benign 09/21/2015   Primary hypothyroidism 09/21/2015   Iris bomb 07/31/2012   Secondary angle-closure glaucoma 07/31/2012   Past Medical History:  Diagnosis Date   Anemia    Blindness of right eye with low vision in contralateral eye    s/p victrectomy   Diabetes mellitus, type II ([place])    Dyslipidemia    Glaucoma    Hypertension    Hypothyroidism (acquired)    Kidney disease    Stage 5   Pneumonia     Family History  Problem Relation Age of Onset   Heart disease Mother    Diabetes Mother    Kidney disease Mother    Diabetes Father    Heart disease Father    Diabetes  Brother    Colon cancer Neg Hx     Past Surgical History:  Procedure Laterality Date   ABDOMINAL AORTOGRAM W/LOWER EXTREMITY Bilateral 12/18/2020   Procedure: ABDOMINAL AORTOGRAM W/LOWER EXTREMITY;  Surgeon: Elam Dutch, MD;  Location: [facility];  Service: Cardiovascular;  Laterality: Bilateral;   ANKLE FRACTURE SURGERY Right    AV FISTULA PLACEMENT Left 08/18/2020   Procedure: LEFT ARM BRACHIOCEPHALIC ARTERIOVENOUS (AV) FISTULA CREATION;  Surgeon: Elam Dutch, MD;  Location: [facility];  Service: Vascular;  Laterality: Left;   BIOPSY  04/24/2021   Procedure: BIOPSY;  Surgeon: Eloise Harman, DO;  [facility];  Service: Endoscopy;;   CESAREAN SECTION  CHOLECYSTECTOMY     COLONOSCOPY  04/24/2021   Surgeon: Eloise Harman, DO;  nonbleeding internal hemorrhoids, 1 large (25 mm) pedunculated transverse colon polyp (prolapse type polyp) with adherent clot and stigmata of recent bleed.   COLONOSCOPY WITH PROPOFOL N/A 05/14/2021   Procedure: COLONOSCOPY WITH PROPOFOL;  Surgeon: Daneil Dolin, MD;  [facility];  Service: Endoscopy;  Laterality: N/A;   ESOPHAGOGASTRODUODENOSCOPY (EGD) WITH PROPOFOL N/A 04/24/2021   Surgeon: Eloise Harman, DO;  duodenal erosions and gastritis biopsied (pathology with peptic duodenitis, reactive gastropathy with erosions/chronic inflammation, negative for H. pylori)   EYE SURGERY     Vatrectomy   HEMOSTASIS CLIP PLACEMENT  05/14/2021   Procedure: HEMOSTASIS CLIP PLACEMENT;  Surgeon: Daneil Dolin, MD;  [facility];  Service: Endoscopy;;   IR PERC TUN PERIT CATH WO PORT S&I /IMAG  09/15/2020   IR REMOVAL TUN CV CATH W/O FL  02/19/2021   IR US GUIDE VASC ACCESS RIGHT  09/15/2020   POLYPECTOMY  04/24/2021   Procedure: POLYPECTOMY;  Surgeon: Eloise Harman, DO;  [facility];  Service: Endoscopy;;   POLYPECTOMY  05/14/2021   Procedure: POLYPECTOMY;  Surgeon: Daneil Dolin, MD;   [facility];  Service: Endoscopy;;   SKIN SPLIT GRAFT Bilateral 09/03/2021   Procedure: SKIN GRAFT BILATERAL LEGS;  Surgeon: Newt Minion, MD;  Location: [facility];  Service: Orthopedics;  Laterality: Bilateral;   TOE SURGERY     Social History   Occupational History   Not on file  Tobacco Use   Smoking status: Never   Smokeless tobacco: Never  Vaping Use   Vaping Use: Never used  Substance and Sexual Activity   Alcohol use: No   Drug use: No   Sexual activity: Yes    Birth control/protection: Condom

## 2021-10-06 ENCOUNTER — Encounter: Payer: Self-pay | Admitting: Family

## 2021-10-08 ENCOUNTER — Ambulatory Visit (INDEPENDENT_AMBULATORY_CARE_PROVIDER_SITE_OTHER): Payer: Medicare Other | Admitting: Family

## 2021-10-08 ENCOUNTER — Encounter: Payer: Self-pay | Admitting: Family

## 2021-10-08 ENCOUNTER — Other Ambulatory Visit: Payer: Self-pay

## 2021-10-08 ENCOUNTER — Telehealth: Payer: Self-pay | Admitting: Orthopedic Surgery

## 2021-10-08 DIAGNOSIS — L97911 Non-pressure chronic ulcer of unspecified part of right lower leg limited to breakdown of skin: Secondary | ICD-10-CM

## 2021-10-08 DIAGNOSIS — L97923 Non-pressure chronic ulcer of unspecified part of left lower leg with necrosis of muscle: Secondary | ICD-10-CM

## 2021-10-08 NOTE — Progress Notes (Signed)
[facility]  Post-Op Visit Note   Patient: Amy Moses           Date of Birth: 1971-04-28           MRN: [id number] Visit Date: 10/08/2021 PCP: Wynelle Cleveland, MD  Chief Complaint:  Chief Complaint  Patient presents with   Right Leg - Routine Post Op   Left Leg - Routine Post Op    HPI:  HPI The patient is a 50 year old woman who presents today in postoperative follow-up.  She has bilateral calciphylaxis her wrists related ulcerations to the bilateral calfs that she has recently had irrigation debridement and skin grafting. We have been doing twice weekly dressing changes Ortho Exam On examination of bilateral lower extremities there is good wrinkling of the skin from compression of the right calf ulcer is completely filled in with bleeding granulation there is no surrounding maceration no odor no purulence the left calf ulcer is much larger please see attached photo.  Unfortunately centrally there is an area about 2 cm in diameter with exposed tendon this is moist.  The remainder of the ulcer is filled in with bleeding granulation.  There is some circumferential epithelialization no maceration  Visit Diagnoses: No diagnosis found.  Plan: Adaptic and Dynaflex compression wraps reapplied today.  She will follow-up in the office next Tuesday for reevaluation.  Follow-Up Instructions: Return in about 4 days (around 10/12/2021).   Imaging: No results found.  Orders:  No orders of the defined types were placed in this encounter.  No orders of the defined types were placed in this encounter.    PMFS History: Patient Active Problem List   Diagnosis Date Noted   Open leg wound 09/03/2021   Wound infection    Non-pressure chronic ulcer of right calf limited to breakdown of skin ([place])    Calciphylaxis of right lower extremity with nonhealing ulcer, limited to breakdown of skin ([place])    Chronic ulcer of left thigh ([place]) 08/07/2021   Calciphylaxis of left lower extremity with  nonhealing ulcer with necrosis of muscle ([place]) 08/07/2021   Lower GI bleed 05/12/2021   Blindness 05/10/2021   Shingles 05/10/2021   Acute GI bleeding 04/24/2021   Acute blood loss anemia 04/23/2021   GI bleed 04/22/2021   Fever 03/31/2021   Acute on chronic heart failure with preserved ejection fraction (HFpEF) ([place]) 03/30/2021   Pressure injury of skin 03/30/2021   End stage renal disease ([place]) 11/16/2020   Calciphylaxis 11/06/2020   Non-healing open wound of heel 11/03/2020   Diabetic foot infection ([place]) 11/01/2020   Decubitus ulcer, heel 11/01/2020   Closed nondisplaced fracture of left patella 86/57/8469   Metabolic acidosis 62/95/2841   Acute on chronic renal failure ([place]) 06/10/2020   Symptomatic anemia 06/10/2020   Acute pericardial effusion 06/10/2020   Other acute nonsuppurative otitis media, unspecified ear 11/29/2019   Chronic kidney disease, stage 4 (severe) ([place]) 03/05/2019   Skin ulcer, limited to breakdown of skin ([place]) 01/28/2019   Vitamin D deficiency 01/28/2019   Uncontrolled type 2 diabetes mellitus with chronic kidney disease, with long-term current use of insulin 09/21/2015   Hyperlipidemia 09/21/2015   Essential hypertension, benign 09/21/2015   Primary hypothyroidism 09/21/2015   Iris bomb 07/31/2012   Secondary angle-closure glaucoma 07/31/2012   Past Medical History:  Diagnosis Date   Anemia    Blindness of right eye with low vision in contralateral eye    s/p victrectomy   Diabetes mellitus, type II ([place])  Dyslipidemia    Glaucoma    Hypertension    Hypothyroidism (acquired)    Kidney disease    Stage 5   Pneumonia     Family History  Problem Relation Age of Onset   Heart disease Mother    Diabetes Mother    Kidney disease Mother    Diabetes Father    Heart disease Father    Diabetes Brother    Colon cancer Neg Hx     Past Surgical History:  Procedure Laterality Date   ABDOMINAL AORTOGRAM W/LOWER EXTREMITY Bilateral 12/18/2020    Procedure: ABDOMINAL AORTOGRAM W/LOWER EXTREMITY;  Surgeon: Elam Dutch, MD;  Location: [facility];  Service: Cardiovascular;  Laterality: Bilateral;   ANKLE FRACTURE SURGERY Right    AV FISTULA PLACEMENT Left 08/18/2020   Procedure: LEFT ARM BRACHIOCEPHALIC ARTERIOVENOUS (AV) FISTULA CREATION;  Surgeon: Elam Dutch, MD;  Location: [facility];  Service: Vascular;  Laterality: Left;   BIOPSY  04/24/2021   Procedure: BIOPSY;  Surgeon: Eloise Harman, DO;  [facility];  Service: Endoscopy;;   CESAREAN SECTION     CHOLECYSTECTOMY     COLONOSCOPY  04/24/2021   Surgeon: Eloise Harman, DO;  nonbleeding internal hemorrhoids, 1 large (25 mm) pedunculated transverse colon polyp (prolapse type polyp) with adherent clot and stigmata of recent bleed.   COLONOSCOPY WITH PROPOFOL N/A 05/14/2021   Procedure: COLONOSCOPY WITH PROPOFOL;  Surgeon: Daneil Dolin, MD;  [facility];  Service: Endoscopy;  Laterality: N/A;   ESOPHAGOGASTRODUODENOSCOPY (EGD) WITH PROPOFOL N/A 04/24/2021   Surgeon: Eloise Harman, DO;  duodenal erosions and gastritis biopsied (pathology with peptic duodenitis, reactive gastropathy with erosions/chronic inflammation, negative for H. pylori)   EYE SURGERY     Vatrectomy   HEMOSTASIS CLIP PLACEMENT  05/14/2021   Procedure: HEMOSTASIS CLIP PLACEMENT;  Surgeon: Daneil Dolin, MD;  [facility];  Service: Endoscopy;;   IR PERC TUN PERIT CATH WO PORT S&I /IMAG  09/15/2020   IR REMOVAL TUN CV CATH W/O FL  02/19/2021   IR US GUIDE VASC ACCESS RIGHT  09/15/2020   POLYPECTOMY  04/24/2021   Procedure: POLYPECTOMY;  Surgeon: Eloise Harman, DO;  [facility];  Service: Endoscopy;;   POLYPECTOMY  05/14/2021   Procedure: POLYPECTOMY;  Surgeon: Daneil Dolin, MD;  [facility];  Service: Endoscopy;;   SKIN SPLIT GRAFT Bilateral 09/03/2021   Procedure: SKIN GRAFT BILATERAL LEGS;  Surgeon: Newt Minion, MD;   Location: [facility];  Service: Orthopedics;  Laterality: Bilateral;   TOE SURGERY     Social History   Occupational History   Not on file  Tobacco Use   Smoking status: Never   Smokeless tobacco: Never  Vaping Use   Vaping Use: Never used  Substance and Sexual Activity   Alcohol use: No   Drug use: No   Sexual activity: Yes    Birth control/protection: Condom

## 2021-10-08 NOTE — Telephone Encounter (Signed)
Pt seen Amy Moses today and was told to come back no Monday for a follow up. Asked if she could do a morning around 10am if possible. [name] is booked up for that day so unsure when and where is the best spot to place her appt for. The best call back number for pt is [phone].

## 2021-10-08 NOTE — Telephone Encounter (Signed)
We have opened up a spot for 10am on Monday for her. She is aware.

## 2021-10-11 ENCOUNTER — Other Ambulatory Visit: Payer: Self-pay

## 2021-10-11 ENCOUNTER — Ambulatory Visit: Payer: Medicare Other | Admitting: Physician Assistant

## 2021-10-11 ENCOUNTER — Ambulatory Visit (INDEPENDENT_AMBULATORY_CARE_PROVIDER_SITE_OTHER): Payer: Medicare Other | Admitting: Orthopedic Surgery

## 2021-10-11 DIAGNOSIS — L97911 Non-pressure chronic ulcer of unspecified part of right lower leg limited to breakdown of skin: Secondary | ICD-10-CM

## 2021-10-11 DIAGNOSIS — L97923 Non-pressure chronic ulcer of unspecified part of left lower leg with necrosis of muscle: Secondary | ICD-10-CM

## 2021-10-18 ENCOUNTER — Ambulatory Visit (INDEPENDENT_AMBULATORY_CARE_PROVIDER_SITE_OTHER): Payer: Medicare Other | Admitting: Orthopedic Surgery

## 2021-10-18 ENCOUNTER — Encounter: Payer: Self-pay | Admitting: Orthopedic Surgery

## 2021-10-18 DIAGNOSIS — L97923 Non-pressure chronic ulcer of unspecified part of left lower leg with necrosis of muscle: Secondary | ICD-10-CM

## 2021-10-18 DIAGNOSIS — L97911 Non-pressure chronic ulcer of unspecified part of right lower leg limited to breakdown of skin: Secondary | ICD-10-CM | POA: Diagnosis not present

## 2021-10-18 NOTE — Progress Notes (Signed)
Office Visit Note   Patient: Amy Moses           Date of Birth: 05/11/1971           MRN: [id number] Visit Date: 10/18/2021              Requested by: Wynelle Cleveland, MD No address on file PCP: Wynelle Cleveland, MD  Chief Complaint  Patient presents with   Left Leg - Routine Post Op    09/03/21 bilateral leg debridement and STSG   Right Leg - Routine Post Op      HPI: Patient is a 50 year old woman status post wound care for calciphylaxis ulcers both lower extremities.  Patient has been undergoing twice a week dressing changes and this past week she did once a week dressing changes.  Assessment & Plan: Visit Diagnoses:  1. Calciphylaxis of right lower extremity with nonhealing ulcer, limited to breakdown of skin ([place])   2. Calciphylaxis of left lower extremity with nonhealing ulcer with necrosis of muscle ([place])     Plan: We will plan to continue twice a week dressing changes.  Again discussed the importance of keeping pressure off the back of her calf.  She is in dialysis 3 times a week.  Follow-up Thursday and Monday.  Follow-Up Instructions: Return in about 1 week (around 10/25/2021) for Follow-up twice a week Thursday and Monday.   Ortho Exam  Patient is alert, oriented, no adenopathy, well-dressed, normal affect, normal respiratory effort. Examination the right leg is healing quite rapidly.  Good healthy granulation tissue.  Semination the left leg there is superficial epithelialization there is necrotic tendon and muscle in the mid aspect of the wound.  After informed consent a 10 blade knife was used to excise the necrotic muscle and tendon.  There is good petechial bleeding no abscess.  Imaging: No results found.      Labs: Lab Results  Component Value Date   HGBA1C 9.5 (H) 08/07/2021   HGBA1C 7.3 (H) 03/30/2021   HGBA1C 7.4 (H) 06/10/2020   ESRSEDRATE 40 (H) 08/07/2021   CRP 11.1 (H) 08/07/2021   REPTSTATUS 08/12/2021 FINAL 08/07/2021   GRAMSTAIN   07/08/2021    RARE WBC PRESENT,BOTH PMN AND MONONUCLEAR RARE GRAM POSITIVE RODS RARE GRAM POSITIVE COCCI IN PAIRS    CULT  08/07/2021    NO GROWTH 5 DAYS Performed at [facility], [place] [address], [place],  [address]    LABORGA CITROBACTER KOSERI 05/21/2021   LABORGA PROTEUS MIRABILIS 05/21/2021     Lab Results  Component Value Date   ALBUMIN 2.7 (L) 09/04/2021   ALBUMIN 2.4 (L) 08/12/2021   ALBUMIN 2.1 (L) 08/10/2021   PREALBUMIN 13.7 (L) 08/07/2021    Lab Results  Component Value Date   MG 2.1 05/14/2021   MG 2.8 (H) 05/11/2021   MG 2.3 04/23/2021   No results found for: VD25OH  Lab Results  Component Value Date   PREALBUMIN 13.7 (L) 08/07/2021   CBC EXTENDED Latest Ref Rng & Units 09/04/2021 09/03/2021 08/12/2021  WBC 4.0 - 10.5 K/uL 8.9 - 8.1  RBC 3.87 - 5.11 MIL/uL 3.49(L) - 4.21  HGB 12.0 - 15.0 g/dL 8.7(L) 11.2(L) 10.1(L)  HCT 36.0 - 46.0 % 29.8(L) 33.0(L) 33.4(L)  PLT 150 - 400 K/uL 276 - 294  NEUTROABS 1.7 - 7.7 K/uL - - -  LYMPHSABS 0.7 - 4.0 K/uL - - -     There is no height or weight on file to calculate BMI.  Orders:  No orders of the defined types were placed in this encounter.  No orders of the defined types were placed in this encounter.    Procedures: No procedures performed  Clinical Data: No additional findings.  ROS:  All other systems negative, except as noted in the HPI. Review of Systems  Objective: Vital Signs: LMP 08/22/2015 (Approximate)   Specialty Comments:  No specialty comments available.  PMFS History: Patient Active Problem List   Diagnosis Date Noted   Open leg wound 09/03/2021   Wound infection    Non-pressure chronic ulcer of right calf limited to breakdown of skin ([place])    Calciphylaxis of right lower extremity with nonhealing ulcer, limited to breakdown of skin ([employer])    Chronic ulcer of left thigh ([place]) 08/07/2021   Calciphylaxis of left lower extremity with nonhealing ulcer with necrosis of  muscle ([place]) 08/07/2021   Lower GI bleed 05/12/2021   Blindness 05/10/2021   Shingles 05/10/2021   Acute GI bleeding 04/24/2021   Acute blood loss anemia 04/23/2021   GI bleed 04/22/2021   Fever 03/31/2021   Acute on chronic heart failure with preserved ejection fraction (HFpEF) ([place]) 03/30/2021   Pressure injury of skin 03/30/2021   End stage renal disease ([place]) 11/16/2020   Calciphylaxis 11/06/2020   Non-healing open wound of heel 11/03/2020   Diabetic foot infection ([place]) 11/01/2020   Decubitus ulcer, heel 11/01/2020   Closed nondisplaced fracture of left patella 19/62/2297   Metabolic acidosis 98/92/1194   Acute on chronic renal failure ([place]) 06/10/2020   Symptomatic anemia 06/10/2020   Acute pericardial effusion 06/10/2020   Other acute nonsuppurative otitis media, unspecified ear 11/29/2019   Chronic kidney disease, stage 4 (severe) ([place]) 03/05/2019   Skin ulcer, limited to breakdown of skin ([place]) 01/28/2019   Vitamin D deficiency 01/28/2019   Uncontrolled type 2 diabetes mellitus with chronic kidney disease, with long-term current use of insulin 09/21/2015   Hyperlipidemia 09/21/2015   Essential hypertension, benign 09/21/2015   Primary hypothyroidism 09/21/2015   Iris bomb 07/31/2012   Secondary angle-closure glaucoma 07/31/2012   Past Medical History:  Diagnosis Date   Anemia    Blindness of right eye with low vision in contralateral eye    s/p victrectomy   Diabetes mellitus, type II ([place])    Dyslipidemia    Glaucoma    Hypertension    Hypothyroidism (acquired)    Kidney disease    Stage 5   Pneumonia     Family History  Problem Relation Age of Onset   Heart disease Mother    Diabetes Mother    Kidney disease Mother    Diabetes Father    Heart disease Father    Diabetes Brother    Colon cancer Neg Hx     Past Surgical History:  Procedure Laterality Date   ABDOMINAL AORTOGRAM W/LOWER EXTREMITY Bilateral 12/18/2020   Procedure: ABDOMINAL AORTOGRAM  W/LOWER EXTREMITY;  Surgeon: Elam Dutch, MD;  Location: [facility];  Service: Cardiovascular;  Laterality: Bilateral;   ANKLE FRACTURE SURGERY Right    AV FISTULA PLACEMENT Left 08/18/2020   Procedure: LEFT ARM BRACHIOCEPHALIC ARTERIOVENOUS (AV) FISTULA CREATION;  Surgeon: Elam Dutch, MD;  Location: [facility];  Service: Vascular;  Laterality: Left;   BIOPSY  04/24/2021   Procedure: BIOPSY;  Surgeon: Eloise Harman, DO;  [facility];  Service: Endoscopy;;   CESAREAN SECTION     CHOLECYSTECTOMY     COLONOSCOPY  04/24/2021   Surgeon:  Hurshel Keys K, DO;  nonbleeding internal hemorrhoids, 1 large (25 mm) pedunculated transverse colon polyp (prolapse type polyp) with adherent clot and stigmata of recent bleed.   COLONOSCOPY WITH PROPOFOL N/A 05/14/2021   Procedure: COLONOSCOPY WITH PROPOFOL;  Surgeon: Daneil Dolin, MD;  [facility];  Service: Endoscopy;  Laterality: N/A;   ESOPHAGOGASTRODUODENOSCOPY (EGD) WITH PROPOFOL N/A 04/24/2021   Surgeon: Eloise Harman, DO;  duodenal erosions and gastritis biopsied (pathology with peptic duodenitis, reactive gastropathy with erosions/chronic inflammation, negative for H. pylori)   EYE SURGERY     Vatrectomy   HEMOSTASIS CLIP PLACEMENT  05/14/2021   Procedure: HEMOSTASIS CLIP PLACEMENT;  Surgeon: Daneil Dolin, MD;  [facility];  Service: Endoscopy;;   IR PERC TUN PERIT CATH WO PORT S&I /IMAG  09/15/2020   IR REMOVAL TUN CV CATH W/O FL  02/19/2021   IR US GUIDE VASC ACCESS RIGHT  09/15/2020   POLYPECTOMY  04/24/2021   Procedure: POLYPECTOMY;  Surgeon: Eloise Harman, DO;  [facility];  Service: Endoscopy;;   POLYPECTOMY  05/14/2021   Procedure: POLYPECTOMY;  Surgeon: Daneil Dolin, MD;  [facility];  Service: Endoscopy;;   SKIN SPLIT GRAFT Bilateral 09/03/2021   Procedure: SKIN GRAFT BILATERAL LEGS;  Surgeon: Newt Minion, MD;  Location: [facility];  Service:  Orthopedics;  Laterality: Bilateral;   TOE SURGERY     Social History   Occupational History   Not on file  Tobacco Use   Smoking status: Never   Smokeless tobacco: Never  Vaping Use   Vaping Use: Never used  Substance and Sexual Activity   Alcohol use: No   Drug use: No   Sexual activity: Yes    Birth control/protection: Condom

## 2021-10-21 ENCOUNTER — Encounter: Payer: Self-pay | Admitting: Orthopedic Surgery

## 2021-10-21 ENCOUNTER — Other Ambulatory Visit: Payer: Self-pay

## 2021-10-21 ENCOUNTER — Ambulatory Visit (INDEPENDENT_AMBULATORY_CARE_PROVIDER_SITE_OTHER): Payer: Medicare Other | Admitting: Orthopedic Surgery

## 2021-10-21 DIAGNOSIS — L97923 Non-pressure chronic ulcer of unspecified part of left lower leg with necrosis of muscle: Secondary | ICD-10-CM | POA: Diagnosis not present

## 2021-10-21 DIAGNOSIS — L97911 Non-pressure chronic ulcer of unspecified part of right lower leg limited to breakdown of skin: Secondary | ICD-10-CM | POA: Diagnosis not present

## 2021-10-21 NOTE — Progress Notes (Signed)
Office Visit Note   Patient: Amy Moses           Date of Birth: 13-Aug-1971           MRN: [id number] Visit Date: 10/11/2021              Requested by: Wynelle Cleveland, MD No address on file PCP: Wynelle Cleveland, MD  Chief Complaint  Patient presents with   Right Leg - Dressing Change   Left Leg - Dressing Change      HPI: Patient is a 50 year old woman who is seen in follow-up for ulcerations posterior calfs.  Assessment & Plan: Visit Diagnoses:  1. Calciphylaxis of right lower extremity with nonhealing ulcer, limited to breakdown of skin ([place])   2. Calciphylaxis of left lower extremity with nonhealing ulcer with necrosis of muscle ([place])     Plan: Continue current compression wraps twice a week.  Follow-Up Instructions: Return in about 1 week (around 10/18/2021).   Ortho Exam  Patient is alert, oriented, no adenopathy, well-dressed, normal affect, normal respiratory effort. Examination patient has healthy granulation tissue in the wound beds there is good interval healing.  She has improved epithelialization better on the right than the left.  Imaging: No results found.    Labs: Lab Results  Component Value Date   HGBA1C 9.5 (H) 08/07/2021   HGBA1C 7.3 (H) 03/30/2021   HGBA1C 7.4 (H) 06/10/2020   ESRSEDRATE 40 (H) 08/07/2021   CRP 11.1 (H) 08/07/2021   REPTSTATUS 08/12/2021 FINAL 08/07/2021   GRAMSTAIN  07/08/2021    RARE WBC PRESENT,BOTH PMN AND MONONUCLEAR RARE GRAM POSITIVE RODS RARE GRAM POSITIVE COCCI IN PAIRS    CULT  08/07/2021    NO GROWTH 5 DAYS Performed at [facility], [place] [address] [place], [place] [address]    LABORGA CITROBACTER KOSERI 05/21/2021   LABORGA PROTEUS MIRABILIS 05/21/2021     Lab Results  Component Value Date   ALBUMIN 2.7 (L) 09/04/2021   ALBUMIN 2.4 (L) 08/12/2021   ALBUMIN 2.1 (L) 08/10/2021   PREALBUMIN 13.7 (L) 08/07/2021    Lab Results  Component Value Date   MG 2.1 05/14/2021   MG 2.8 (H)  05/11/2021   MG 2.3 04/23/2021   No results found for: VD25OH  Lab Results  Component Value Date   PREALBUMIN 13.7 (L) 08/07/2021   CBC EXTENDED Latest Ref Rng & Units 09/04/2021 09/03/2021 08/12/2021  WBC 4.0 - 10.5 K/uL 8.9 - 8.1  RBC 3.87 - 5.11 MIL/uL 3.49(L) - 4.21  HGB 12.0 - 15.0 g/dL 8.7(L) 11.2(L) 10.1(L)  HCT 36.0 - 46.0 % 29.8(L) 33.0(L) 33.4(L)  PLT 150 - 400 K/uL 276 - 294  NEUTROABS 1.7 - 7.7 K/uL - - -  LYMPHSABS 0.7 - 4.0 K/uL - - -     There is no height or weight on file to calculate BMI.  Orders:  No orders of the defined types were placed in this encounter.  No orders of the defined types were placed in this encounter.    Procedures: No procedures performed  Clinical Data: No additional findings.  ROS:  All other systems negative, except as noted in the HPI. Review of Systems  Objective: Vital Signs: LMP 08/22/2015 (Approximate)   Specialty Comments:  No specialty comments available.  PMFS History: Patient Active Problem List   Diagnosis Date Noted   Open leg wound 09/03/2021   Wound infection    Non-pressure chronic ulcer of right calf limited to breakdown of skin (  [place])    Calciphylaxis of right lower extremity with nonhealing ulcer, limited to breakdown of skin ([place])    Chronic ulcer of left thigh ([place]) 08/07/2021   Calciphylaxis of left lower extremity with nonhealing ulcer with necrosis of muscle ([place]) 08/07/2021   Lower GI bleed 05/12/2021   Blindness 05/10/2021   Shingles 05/10/2021   Acute GI bleeding 04/24/2021   Acute blood loss anemia 04/23/2021   GI bleed 04/22/2021   Fever 03/31/2021   Acute on chronic heart failure with preserved ejection fraction (HFpEF) ([place]) 03/30/2021   Pressure injury of skin 03/30/2021   End stage renal disease ([place]) 11/16/2020   Calciphylaxis 11/06/2020   Non-healing open wound of heel 11/03/2020   Diabetic foot infection ([place]) 11/01/2020   Decubitus ulcer, heel 11/01/2020   Closed nondisplaced  fracture of left patella 68/10/7516   Metabolic acidosis 00/17/4944   Acute on chronic renal failure ([place]) 06/10/2020   Symptomatic anemia 06/10/2020   Acute pericardial effusion 06/10/2020   Other acute nonsuppurative otitis media, unspecified ear 11/29/2019   Chronic kidney disease, stage 4 (severe) ([place]) 03/05/2019   Skin ulcer, limited to breakdown of skin ([place]) 01/28/2019   Vitamin D deficiency 01/28/2019   Uncontrolled type 2 diabetes mellitus with chronic kidney disease, with long-term current use of insulin 09/21/2015   Hyperlipidemia 09/21/2015   Essential hypertension, benign 09/21/2015   Primary hypothyroidism 09/21/2015   Iris bomb 07/31/2012   Secondary angle-closure glaucoma 07/31/2012   Past Medical History:  Diagnosis Date   Anemia    Blindness of right eye with low vision in contralateral eye    s/p victrectomy   Diabetes mellitus, type II ([place])    Dyslipidemia    Glaucoma    Hypertension    Hypothyroidism (acquired)    Kidney disease    Stage 5   Pneumonia     Family History  Problem Relation Age of Onset   Heart disease Mother    Diabetes Mother    Kidney disease Mother    Diabetes Father    Heart disease Father    Diabetes Brother    Colon cancer Neg Hx     Past Surgical History:  Procedure Laterality Date   ABDOMINAL AORTOGRAM W/LOWER EXTREMITY Bilateral 12/18/2020   Procedure: ABDOMINAL AORTOGRAM W/LOWER EXTREMITY;  Surgeon: Elam Dutch, MD;  Location: [facility];  Service: Cardiovascular;  Laterality: Bilateral;   ANKLE FRACTURE SURGERY Right    AV FISTULA PLACEMENT Left 08/18/2020   Procedure: LEFT ARM BRACHIOCEPHALIC ARTERIOVENOUS (AV) FISTULA CREATION;  Surgeon: Elam Dutch, MD;  Location: [facility];  Service: Vascular;  Laterality: Left;   BIOPSY  04/24/2021   Procedure: BIOPSY;  Surgeon: Eloise Harman, DO;  [facility];  Service: Endoscopy;;   CESAREAN SECTION     CHOLECYSTECTOMY     COLONOSCOPY   04/24/2021   Surgeon: Eloise Harman, DO;  nonbleeding internal hemorrhoids, 1 large (25 mm) pedunculated transverse colon polyp (prolapse type polyp) with adherent clot and stigmata of recent bleed.   COLONOSCOPY WITH PROPOFOL N/A 05/14/2021   Procedure: COLONOSCOPY WITH PROPOFOL;  Surgeon: Daneil Dolin, MD;  [facility];  Service: Endoscopy;  Laterality: N/A;   ESOPHAGOGASTRODUODENOSCOPY (EGD) WITH PROPOFOL N/A 04/24/2021   Surgeon: Eloise Harman, DO;  duodenal erosions and gastritis biopsied (pathology with peptic duodenitis, reactive gastropathy with erosions/chronic inflammation, negative for H. pylori)   EYE SURGERY     Vatrectomy   HEMOSTASIS CLIP PLACEMENT  05/14/2021   Procedure: HEMOSTASIS CLIP PLACEMENT;  Surgeon: Daneil Dolin, MD;  [facility];  Service: Endoscopy;;   IR PERC TUN PERIT CATH WO PORT S&I /IMAG  09/15/2020   IR REMOVAL TUN CV CATH W/O FL  02/19/2021   IR US GUIDE VASC ACCESS RIGHT  09/15/2020   POLYPECTOMY  04/24/2021   Procedure: POLYPECTOMY;  Surgeon: Eloise Harman, DO;  [facility];  Service: Endoscopy;;   POLYPECTOMY  05/14/2021   Procedure: POLYPECTOMY;  Surgeon: Daneil Dolin, MD;  [facility];  Service: Endoscopy;;   SKIN SPLIT GRAFT Bilateral 09/03/2021   Procedure: SKIN GRAFT BILATERAL LEGS;  Surgeon: Newt Minion, MD;  Location: [facility];  Service: Orthopedics;  Laterality: Bilateral;   TOE SURGERY     Social History   Occupational History   Not on file  Tobacco Use   Smoking status: Never   Smokeless tobacco: Never  Vaping Use   Vaping Use: Never used  Substance and Sexual Activity   Alcohol use: No   Drug use: No   Sexual activity: Yes    Birth control/protection: Condom

## 2021-10-21 NOTE — Progress Notes (Signed)
Office Visit Note   Patient: Amy Moses           Date of Birth: [birth year]           MRN: [id number] Visit Date: 10/21/2021              Requested by: Wynelle Cleveland, MD No address on file PCP: Wynelle Cleveland, MD  Chief Complaint  Patient presents with   Left Leg - Follow-up   Right Leg - Follow-up      HPI: Patient is a 50 year old woman who presents in follow-up status post debridement and skin graft for calciphylaxis both lower extremities she is undergoing compression wraps twice a week.  Patient has no complaints.  Assessment & Plan: Visit Diagnoses:  1. Calciphylaxis of right lower extremity with nonhealing ulcer, limited to breakdown of skin ([place])   2. Calciphylaxis of left lower extremity with nonhealing ulcer with necrosis of muscle ([place])     Plan: Continue with twice a week compression wraps.  Follow-Up Instructions: Return in about 1 week (around 10/28/2021) for Follow-up Monday and Thursday.   Ortho Exam  Patient is alert, oriented, no adenopathy, well-dressed, normal affect, normal respiratory effort. Examination the right calf ulcer is almost completely healed.  There is healthy granulation tissue drainage continues to decrease.  Left lower extremity has 90% healthy flat granulation tissue with decreased drainage no cellulitis there is a small area of exposed fascial tissue in the mid aspect of the wound no signs of infection.  Imaging: No results found.    Labs: Lab Results  Component Value Date   HGBA1C 9.5 (H) 08/07/2021   HGBA1C 7.3 (H) 03/30/2021   HGBA1C 7.4 (H) 06/10/2020   ESRSEDRATE 40 (H) 08/07/2021   CRP 11.1 (H) 08/07/2021   REPTSTATUS 08/12/2021 FINAL 08/07/2021   GRAMSTAIN  07/08/2021    RARE WBC PRESENT,BOTH PMN AND MONONUCLEAR RARE GRAM POSITIVE RODS RARE GRAM POSITIVE COCCI IN PAIRS    CULT  08/07/2021    NO GROWTH 5 DAYS Performed at [facility], [place] [address], [place], [place] [address]    LABORGA  CITROBACTER KOSERI 05/21/2021   LABORGA PROTEUS MIRABILIS 05/21/2021     Lab Results  Component Value Date   ALBUMIN 2.7 (L) 09/04/2021   ALBUMIN 2.4 (L) 08/12/2021   ALBUMIN 2.1 (L) 08/10/2021   PREALBUMIN 13.7 (L) 08/07/2021    Lab Results  Component Value Date   MG 2.1 05/14/2021   MG 2.8 (H) 05/11/2021   MG 2.3 04/23/2021   No results found for: VD25OH  Lab Results  Component Value Date   PREALBUMIN 13.7 (L) 08/07/2021   CBC EXTENDED Latest Ref Rng & Units 09/04/2021 09/03/2021 08/12/2021  WBC 4.0 - 10.5 K/uL 8.9 - 8.1  RBC 3.87 - 5.11 MIL/uL 3.49(L) - 4.21  HGB 12.0 - 15.0 g/dL 8.7(L) 11.2(L) 10.1(L)  HCT 36.0 - 46.0 % 29.8(L) 33.0(L) 33.4(L)  PLT 150 - 400 K/uL 276 - 294  NEUTROABS 1.7 - 7.7 K/uL - - -  LYMPHSABS 0.7 - 4.0 K/uL - - -     There is no height or weight on file to calculate BMI.  Orders:  No orders of the defined types were placed in this encounter.  No orders of the defined types were placed in this encounter.    Procedures: No procedures performed  Clinical Data: No additional findings.  ROS:  All other systems negative, except as noted in the HPI. Review of Systems  Objective: Vital  Signs: LMP 08/22/2015 (Approximate)   Specialty Comments:  No specialty comments available.  PMFS History: Patient Active Problem List   Diagnosis Date Noted   Open leg wound 09/03/2021   Wound infection    Non-pressure chronic ulcer of right calf limited to breakdown of skin ([place])    Calciphylaxis of right lower extremity with nonhealing ulcer, limited to breakdown of skin ([place])    Chronic ulcer of left thigh ([place]) 08/07/2021   Calciphylaxis of left lower extremity with nonhealing ulcer with necrosis of muscle ([place]) 08/07/2021   Lower GI bleed 05/12/2021   Blindness 05/10/2021   Shingles 05/10/2021   Acute GI bleeding 04/24/2021   Acute blood loss anemia 04/23/2021   GI bleed 04/22/2021   Fever 03/31/2021   Acute on chronic heart failure  with preserved ejection fraction (HFpEF) ([place]) 03/30/2021   Pressure injury of skin 03/30/2021   End stage renal disease ([place]) 11/16/2020   Calciphylaxis 11/06/2020   Non-healing open wound of heel 11/03/2020   Diabetic foot infection ([place]) 11/01/2020   Decubitus ulcer, heel 11/01/2020   Closed nondisplaced fracture of left patella 30/86/5784   Metabolic acidosis 69/62/9528   Acute on chronic renal failure ([place]) 06/10/2020   Symptomatic anemia 06/10/2020   Acute pericardial effusion 06/10/2020   Other acute nonsuppurative otitis media, unspecified ear 11/29/2019   Chronic kidney disease, stage 4 (severe) ([place]) 03/05/2019   Skin ulcer, limited to breakdown of skin ([place]) 01/28/2019   Vitamin D deficiency 01/28/2019   Uncontrolled type 2 diabetes mellitus with chronic kidney disease, with long-term current use of insulin 09/21/2015   Hyperlipidemia 09/21/2015   Essential hypertension, benign 09/21/2015   Primary hypothyroidism 09/21/2015   Iris bomb 07/31/2012   Secondary angle-closure glaucoma 07/31/2012   Past Medical History:  Diagnosis Date   Anemia    Blindness of right eye with low vision in contralateral eye    s/p victrectomy   Diabetes mellitus, type II ([place])    Dyslipidemia    Glaucoma    Hypertension    Hypothyroidism (acquired)    Kidney disease    Stage 5   Pneumonia     Family History  Problem Relation Age of Onset   Heart disease Mother    Diabetes Mother    Kidney disease Mother    Diabetes Father    Heart disease Father    Diabetes Brother    Colon cancer Neg Hx     Past Surgical History:  Procedure Laterality Date   ABDOMINAL AORTOGRAM W/LOWER EXTREMITY Bilateral 12/18/2020   Procedure: ABDOMINAL AORTOGRAM W/LOWER EXTREMITY;  Surgeon: Elam Dutch, MD;  Location: [facility];  Service: Cardiovascular;  Laterality: Bilateral;   ANKLE FRACTURE SURGERY Right    AV FISTULA PLACEMENT Left 08/18/2020   Procedure: LEFT ARM BRACHIOCEPHALIC  ARTERIOVENOUS (AV) FISTULA CREATION;  Surgeon: Elam Dutch, MD;  Location: [facility];  Service: Vascular;  Laterality: Left;   BIOPSY  04/24/2021   Procedure: BIOPSY;  Surgeon: Eloise Harman, DO;  [facility];  Service: Endoscopy;;   CESAREAN SECTION     CHOLECYSTECTOMY     COLONOSCOPY  04/24/2021   Surgeon: Eloise Harman, DO;  nonbleeding internal hemorrhoids, 1 large (25 mm) pedunculated transverse colon polyp (prolapse type polyp) with adherent clot and stigmata of recent bleed.   COLONOSCOPY WITH PROPOFOL N/A 05/14/2021   Procedure: COLONOSCOPY WITH PROPOFOL;  Surgeon: Daneil Dolin, MD;  [facility];  Service: Endoscopy;  Laterality: N/A;  ESOPHAGOGASTRODUODENOSCOPY (EGD) WITH PROPOFOL N/A 04/24/2021   Surgeon: Eloise Harman, DO;  duodenal erosions and gastritis biopsied (pathology with peptic duodenitis, reactive gastropathy with erosions/chronic inflammation, negative for H. pylori)   EYE SURGERY     Vatrectomy   HEMOSTASIS CLIP PLACEMENT  05/14/2021   Procedure: HEMOSTASIS CLIP PLACEMENT;  Surgeon: Daneil Dolin, MD;  [facility];  Service: Endoscopy;;   IR PERC TUN PERIT CATH WO PORT S&I /IMAG  09/15/2020   IR REMOVAL TUN CV CATH W/O FL  02/19/2021   IR US GUIDE VASC ACCESS RIGHT  09/15/2020   POLYPECTOMY  04/24/2021   Procedure: POLYPECTOMY;  Surgeon: Eloise Harman, DO;  [facility];  Service: Endoscopy;;   POLYPECTOMY  05/14/2021   Procedure: POLYPECTOMY;  Surgeon: Daneil Dolin, MD;  [facility];  Service: Endoscopy;;   SKIN SPLIT GRAFT Bilateral 09/03/2021   Procedure: SKIN GRAFT BILATERAL LEGS;  Surgeon: Newt Minion, MD;  Location: [facility];  Service: Orthopedics;  Laterality: Bilateral;   TOE SURGERY     Social History   Occupational History   Not on file  Tobacco Use   Smoking status: Never   Smokeless tobacco: Never  Vaping Use   Vaping Use: Never used  Substance and Sexual  Activity   Alcohol use: No   Drug use: No   Sexual activity: Yes    Birth control/protection: Condom

## 2021-10-25 ENCOUNTER — Telehealth: Payer: Self-pay | Admitting: Orthopedic Surgery

## 2021-10-25 ENCOUNTER — Ambulatory Visit (INDEPENDENT_AMBULATORY_CARE_PROVIDER_SITE_OTHER): Payer: Medicare Other | Admitting: Orthopedic Surgery

## 2021-10-25 ENCOUNTER — Encounter: Payer: Self-pay | Admitting: Orthopedic Surgery

## 2021-10-25 DIAGNOSIS — L97911 Non-pressure chronic ulcer of unspecified part of right lower leg limited to breakdown of skin: Secondary | ICD-10-CM

## 2021-10-25 DIAGNOSIS — L97923 Non-pressure chronic ulcer of unspecified part of left lower leg with necrosis of muscle: Secondary | ICD-10-CM

## 2021-10-25 NOTE — Telephone Encounter (Signed)
Pam with home nursing called wanting to know if Dr.Duda would be willing to sign the pts plan of care orders? She would like a CB.  Pam# [phone]

## 2021-10-25 NOTE — Progress Notes (Signed)
Office Visit Note   Patient: Amy Moses           Date of Birth: 1971-06-13           MRN: [id number] Visit Date: 10/25/2021              Requested by: Wynelle Cleveland, MD No address on file PCP: Wynelle Cleveland, MD  Chief Complaint  Patient presents with   Right Leg - Wound Check, Follow-up    Dressing changes   Left Leg - Wound Check, Follow-up    Dressing changes       HPI: Patient is a 50 year old woman who presents in follow-up status post debridement and skin grafting for calciphylaxis wounds both calfs.  Assessment & Plan: Visit Diagnoses:  1. Calciphylaxis of right lower extremity with nonhealing ulcer, limited to breakdown of skin ([place])   2. Calciphylaxis of left lower extremity with nonhealing ulcer with necrosis of muscle ([place])     Plan: 3 layer compression wraps were applied to both legs continue to follow-up twice a week.  Follow-Up Instructions: Return in about 1 week (around 11/01/2021) for Follow-up twice weekly.   Ortho Exam  Patient is alert, oriented, no adenopathy, well-dressed, normal affect, normal respiratory effort. Examination the right leg continues to heal quite rapidly.  There is good epithelization around the wound edges good granulation tissue.  The left leg wound bed shows healthy granulation tissue the exposed tendon appears to be granulating over at this time.  Imaging: No results found.    Labs: Lab Results  Component Value Date   HGBA1C 9.5 (H) 08/07/2021   HGBA1C 7.3 (H) 03/30/2021   HGBA1C 7.4 (H) 06/10/2020   ESRSEDRATE 40 (H) 08/07/2021   CRP 11.1 (H) 08/07/2021   REPTSTATUS 08/12/2021 FINAL 08/07/2021   GRAMSTAIN  07/08/2021    RARE WBC PRESENT,BOTH PMN AND MONONUCLEAR RARE GRAM POSITIVE RODS RARE GRAM POSITIVE COCCI IN PAIRS    CULT  08/07/2021    NO GROWTH 5 DAYS Performed at [facility], [place] [address] [place], [place] [address]    LABORGA CITROBACTER KOSERI 05/21/2021   LABORGA PROTEUS  MIRABILIS 05/21/2021     Lab Results  Component Value Date   ALBUMIN 2.7 (L) 09/04/2021   ALBUMIN 2.4 (L) 08/12/2021   ALBUMIN 2.1 (L) 08/10/2021   PREALBUMIN 13.7 (L) 08/07/2021    Lab Results  Component Value Date   MG 2.1 05/14/2021   MG 2.8 (H) 05/11/2021   MG 2.3 04/23/2021   No results found for: VD25OH  Lab Results  Component Value Date   PREALBUMIN 13.7 (L) 08/07/2021   CBC EXTENDED Latest Ref Rng & Units 09/04/2021 09/03/2021 08/12/2021  WBC 4.0 - 10.5 K/uL 8.9 - 8.1  RBC 3.87 - 5.11 MIL/uL 3.49(L) - 4.21  HGB 12.0 - 15.0 g/dL 8.7(L) 11.2(L) 10.1(L)  HCT 36.0 - 46.0 % 29.8(L) 33.0(L) 33.4(L)  PLT 150 - 400 K/uL 276 - 294  NEUTROABS 1.7 - 7.7 K/uL - - -  LYMPHSABS 0.7 - 4.0 K/uL - - -     There is no height or weight on file to calculate BMI.  Orders:  No orders of the defined types were placed in this encounter.  No orders of the defined types were placed in this encounter.    Procedures: No procedures performed  Clinical Data: No additional findings.  ROS:  All other systems negative, except as noted in the HPI. Review of Systems  Objective: Vital Signs: LMP 08/22/2015 (  Approximate)   Specialty Comments:  No specialty comments available.  PMFS History: Patient Active Problem List   Diagnosis Date Noted   Open leg wound 09/03/2021   Wound infection    Non-pressure chronic ulcer of right calf limited to breakdown of skin ([place])    Calciphylaxis of right lower extremity with nonhealing ulcer, limited to breakdown of skin ([place])    Chronic ulcer of left thigh ([place]) 08/07/2021   Calciphylaxis of left lower extremity with nonhealing ulcer with necrosis of muscle ([place]) 08/07/2021   Lower GI bleed 05/12/2021   Blindness 05/10/2021   Shingles 05/10/2021   Acute GI bleeding 04/24/2021   Acute blood loss anemia 04/23/2021   GI bleed 04/22/2021   Fever 03/31/2021   Acute on chronic heart failure with preserved ejection fraction (HFpEF) ([place])  03/30/2021   Pressure injury of skin 03/30/2021   End stage renal disease ([place]) 11/16/2020   Calciphylaxis 11/06/2020   Non-healing open wound of heel 11/03/2020   Diabetic foot infection ([place]) 11/01/2020   Decubitus ulcer, heel 11/01/2020   Closed nondisplaced fracture of left patella 35/70/1779   Metabolic acidosis 39/01/91   Acute on chronic renal failure ([place]) 06/10/2020   Symptomatic anemia 06/10/2020   Acute pericardial effusion 06/10/2020   Other acute nonsuppurative otitis media, unspecified ear 11/29/2019   Chronic kidney disease, stage 4 (severe) ([place]) 03/05/2019   Skin ulcer, limited to breakdown of skin ([place]) 01/28/2019   Vitamin D deficiency 01/28/2019   Uncontrolled type 2 diabetes mellitus with chronic kidney disease, with long-term current use of insulin 09/21/2015   Hyperlipidemia 09/21/2015   Essential hypertension, benign 09/21/2015   Primary hypothyroidism 09/21/2015   Iris bomb 07/31/2012   Secondary angle-closure glaucoma 07/31/2012   Past Medical History:  Diagnosis Date   Anemia    Blindness of right eye with low vision in contralateral eye    s/p victrectomy   Diabetes mellitus, type II ([place])    Dyslipidemia    Glaucoma    Hypertension    Hypothyroidism (acquired)    Kidney disease    Stage 5   Pneumonia     Family History  Problem Relation Age of Onset   Heart disease Mother    Diabetes Mother    Kidney disease Mother    Diabetes Father    Heart disease Father    Diabetes Brother    Colon cancer Neg Hx     Past Surgical History:  Procedure Laterality Date   ABDOMINAL AORTOGRAM W/LOWER EXTREMITY Bilateral 12/18/2020   Procedure: ABDOMINAL AORTOGRAM W/LOWER EXTREMITY;  Surgeon: Elam Dutch, MD;  Location: [facility];  Service: Cardiovascular;  Laterality: Bilateral;   ANKLE FRACTURE SURGERY Right    AV FISTULA PLACEMENT Left 08/18/2020   Procedure: LEFT ARM BRACHIOCEPHALIC ARTERIOVENOUS (AV) FISTULA CREATION;  Surgeon:  Elam Dutch, MD;  Location: [facility];  Service: Vascular;  Laterality: Left;   BIOPSY  04/24/2021   Procedure: BIOPSY;  Surgeon: Eloise Harman, DO;  [facility];  Service: Endoscopy;;   CESAREAN SECTION     CHOLECYSTECTOMY     COLONOSCOPY  04/24/2021   Surgeon: Eloise Harman, DO;  nonbleeding internal hemorrhoids, 1 large (25 mm) pedunculated transverse colon polyp (prolapse type polyp) with adherent clot and stigmata of recent bleed.   COLONOSCOPY WITH PROPOFOL N/A 05/14/2021   Procedure: COLONOSCOPY WITH PROPOFOL;  Surgeon: Daneil Dolin, MD;  [facility];  Service: Endoscopy;  Laterality: N/A;   ESOPHAGOGASTRODUODENOSCOPY (  EGD) WITH PROPOFOL N/A 04/24/2021   Surgeon: Eloise Harman, DO;  duodenal erosions and gastritis biopsied (pathology with peptic duodenitis, reactive gastropathy with erosions/chronic inflammation, negative for H. pylori)   EYE SURGERY     Vatrectomy   HEMOSTASIS CLIP PLACEMENT  05/14/2021   Procedure: HEMOSTASIS CLIP PLACEMENT;  Surgeon: Daneil Dolin, MD;  [facility];  Service: Endoscopy;;   IR PERC TUN PERIT CATH WO PORT S&I /IMAG  09/15/2020   IR REMOVAL TUN CV CATH W/O FL  02/19/2021   IR US GUIDE VASC ACCESS RIGHT  09/15/2020   POLYPECTOMY  04/24/2021   Procedure: POLYPECTOMY;  Surgeon: Eloise Harman, DO;  [facility];  Service: Endoscopy;;   POLYPECTOMY  05/14/2021   Procedure: POLYPECTOMY;  Surgeon: Daneil Dolin, MD;  [facility];  Service: Endoscopy;;   SKIN SPLIT GRAFT Bilateral 09/03/2021   Procedure: SKIN GRAFT BILATERAL LEGS;  Surgeon: Newt Minion, MD;  Location: [facility];  Service: Orthopedics;  Laterality: Bilateral;   TOE SURGERY     Social History   Occupational History   Not on file  Tobacco Use   Smoking status: Never   Smokeless tobacco: Never  Vaping Use   Vaping Use: Never used  Substance and Sexual Activity   Alcohol use: No   Drug use: No   Sexual  activity: Yes    Birth control/protection: Condom

## 2021-10-26 NOTE — Telephone Encounter (Signed)
I called and sw Pam to advise that yes Dr. [name] will sign on orders for this pt we see her in the office twice a week for dressing changes.

## 2021-10-28 ENCOUNTER — Ambulatory Visit (INDEPENDENT_AMBULATORY_CARE_PROVIDER_SITE_OTHER): Payer: Medicare Other | Admitting: Orthopedic Surgery

## 2021-10-28 ENCOUNTER — Other Ambulatory Visit: Payer: Self-pay

## 2021-10-28 DIAGNOSIS — L97923 Non-pressure chronic ulcer of unspecified part of left lower leg with necrosis of muscle: Secondary | ICD-10-CM | POA: Diagnosis not present

## 2021-10-28 DIAGNOSIS — L97911 Non-pressure chronic ulcer of unspecified part of right lower leg limited to breakdown of skin: Secondary | ICD-10-CM | POA: Diagnosis not present

## 2021-10-29 ENCOUNTER — Ambulatory Visit: Payer: Medicare Other | Admitting: Physician Assistant

## 2021-11-01 ENCOUNTER — Ambulatory Visit: Payer: Medicare Other | Admitting: Orthopedic Surgery

## 2021-11-02 ENCOUNTER — Encounter: Payer: Self-pay | Admitting: Orthopedic Surgery

## 2021-11-02 NOTE — Progress Notes (Signed)
Office Visit Note   Patient: Amy Moses           Date of Birth: 09/21/1971           MRN: [id number] Visit Date: 10/28/2021              Requested by: Wynelle Cleveland, MD No address on file PCP: Wynelle Cleveland, MD  Chief Complaint  Patient presents with   Right Leg - Follow-up   Left Leg - Follow-up      HPI: Patient is a 50 year old woman who presents in follow-up for skin graft for calciphylaxis bilateral lower extremities.  The dressings are removed she still has healthy bleeding granulation tissue.  Assessment & Plan: Visit Diagnoses:  1. Calciphylaxis of right lower extremity with nonhealing ulcer, limited to breakdown of skin ([place])   2. Calciphylaxis of left lower extremity with nonhealing ulcer with necrosis of muscle ([place])     Plan: We will continue with the compression wraps follow-up Monday and Thursday.  Follow-Up Instructions: Return in about 1 week (around 11/04/2021).   Ortho Exam  Patient is alert, oriented, no adenopathy, well-dressed, normal affect, normal respiratory effort. Examination patient has a small blister proximal to the compression wrap on the right leg.  The wounds continue to decrease in size on both lower extremities with healthy bleeding granulation tissue.  Imaging: No results found. No images are attached to the encounter.  Labs: Lab Results  Component Value Date   HGBA1C 9.5 (H) 08/07/2021   HGBA1C 7.3 (H) 03/30/2021   HGBA1C 7.4 (H) 06/10/2020   ESRSEDRATE 40 (H) 08/07/2021   CRP 11.1 (H) 08/07/2021   REPTSTATUS 08/12/2021 FINAL 08/07/2021   GRAMSTAIN  07/08/2021    RARE WBC PRESENT,BOTH PMN AND MONONUCLEAR RARE GRAM POSITIVE RODS RARE GRAM POSITIVE COCCI IN PAIRS    CULT  08/07/2021    NO GROWTH 5 DAYS Performed at [facility], [place] [address], [place], [place] [address]    LABORGA CITROBACTER KOSERI 05/21/2021   LABORGA PROTEUS MIRABILIS 05/21/2021     Lab Results  Component Value Date   ALBUMIN  2.7 (L) 09/04/2021   ALBUMIN 2.4 (L) 08/12/2021   ALBUMIN 2.1 (L) 08/10/2021   PREALBUMIN 13.7 (L) 08/07/2021    Lab Results  Component Value Date   MG 2.1 05/14/2021   MG 2.8 (H) 05/11/2021   MG 2.3 04/23/2021   No results found for: VD25OH  Lab Results  Component Value Date   PREALBUMIN 13.7 (L) 08/07/2021   CBC EXTENDED Latest Ref Rng & Units 09/04/2021 09/03/2021 08/12/2021  WBC 4.0 - 10.5 K/uL 8.9 - 8.1  RBC 3.87 - 5.11 MIL/uL 3.49(L) - 4.21  HGB 12.0 - 15.0 g/dL 8.7(L) 11.2(L) 10.1(L)  HCT 36.0 - 46.0 % 29.8(L) 33.0(L) 33.4(L)  PLT 150 - 400 K/uL 276 - 294  NEUTROABS 1.7 - 7.7 K/uL - - -  LYMPHSABS 0.7 - 4.0 K/uL - - -     There is no height or weight on file to calculate BMI.  Orders:  No orders of the defined types were placed in this encounter.  No orders of the defined types were placed in this encounter.    Procedures: No procedures performed  Clinical Data: No additional findings.  ROS:  All other systems negative, except as noted in the HPI. Review of Systems  Objective: Vital Signs: LMP 08/22/2015 (Approximate)   Specialty Comments:  No specialty comments available.  PMFS History: Patient Active Problem List  Diagnosis Date Noted   Open leg wound 09/03/2021   Wound infection    Non-pressure chronic ulcer of right calf limited to breakdown of skin ([facility])    Calciphylaxis of right lower extremity with nonhealing ulcer, limited to breakdown of skin ([place])    Chronic ulcer of left thigh ([place]) 08/07/2021   Calciphylaxis of left lower extremity with nonhealing ulcer with necrosis of muscle ([place]) 08/07/2021   Lower GI bleed 05/12/2021   Blindness 05/10/2021   Shingles 05/10/2021   Acute GI bleeding 04/24/2021   Acute blood loss anemia 04/23/2021   GI bleed 04/22/2021   Fever 03/31/2021   Acute on chronic heart failure with preserved ejection fraction (HFpEF) ([place]) 03/30/2021   Pressure injury of skin 03/30/2021   End stage renal disease  ([place]) 11/16/2020   Calciphylaxis 11/06/2020   Non-healing open wound of heel 11/03/2020   Diabetic foot infection ([place]) 11/01/2020   Decubitus ulcer, heel 11/01/2020   Closed nondisplaced fracture of left patella 76/19/5093   Metabolic acidosis 26/71/2458   Acute on chronic renal failure ([place]) 06/10/2020   Symptomatic anemia 06/10/2020   Acute pericardial effusion 06/10/2020   Other acute nonsuppurative otitis media, unspecified ear 11/29/2019   Chronic kidney disease, stage 4 (severe) ([place]) 03/05/2019   Skin ulcer, limited to breakdown of skin ([place]) 01/28/2019   Vitamin D deficiency 01/28/2019   Uncontrolled type 2 diabetes mellitus with chronic kidney disease, with long-term current use of insulin 09/21/2015   Hyperlipidemia 09/21/2015   Essential hypertension, benign 09/21/2015   Primary hypothyroidism 09/21/2015   Iris bomb 07/31/2012   Secondary angle-closure glaucoma 07/31/2012   Past Medical History:  Diagnosis Date   Anemia    Blindness of right eye with low vision in contralateral eye    s/p victrectomy   Diabetes mellitus, type II ([place])    Dyslipidemia    Glaucoma    Hypertension    Hypothyroidism (acquired)    Kidney disease    Stage 5   Pneumonia     Family History  Problem Relation Age of Onset   Heart disease Mother    Diabetes Mother    Kidney disease Mother    Diabetes Father    Heart disease Father    Diabetes Brother    Colon cancer Neg Hx     Past Surgical History:  Procedure Laterality Date   ABDOMINAL AORTOGRAM W/LOWER EXTREMITY Bilateral 12/18/2020   Procedure: ABDOMINAL AORTOGRAM W/LOWER EXTREMITY;  Surgeon: Elam Dutch, MD;  Location: [facility];  Service: Cardiovascular;  Laterality: Bilateral;   ANKLE FRACTURE SURGERY Right    AV FISTULA PLACEMENT Left 08/18/2020   Procedure: LEFT ARM BRACHIOCEPHALIC ARTERIOVENOUS (AV) FISTULA CREATION;  Surgeon: Elam Dutch, MD;  Location: [facility];  Service: Vascular;  Laterality: Left;    BIOPSY  04/24/2021   Procedure: BIOPSY;  Surgeon: Eloise Harman, DO;  [facility];  Service: Endoscopy;;   CESAREAN SECTION     CHOLECYSTECTOMY     COLONOSCOPY  04/24/2021   Surgeon: Eloise Harman, DO;  nonbleeding internal hemorrhoids, 1 large (25 mm) pedunculated transverse colon polyp (prolapse type polyp) with adherent clot and stigmata of recent bleed.   COLONOSCOPY WITH PROPOFOL N/A 05/14/2021   Procedure: COLONOSCOPY WITH PROPOFOL;  Surgeon: Daneil Dolin, MD;  [facility];  Service: Endoscopy;  Laterality: N/A;   ESOPHAGOGASTRODUODENOSCOPY (EGD) WITH PROPOFOL N/A 04/24/2021   Surgeon: Eloise Harman, DO;  duodenal erosions and gastritis biopsied (pathology  with peptic duodenitis, reactive gastropathy with erosions/chronic inflammation, negative for H. pylori)   EYE SURGERY     Vatrectomy   HEMOSTASIS CLIP PLACEMENT  05/14/2021   Procedure: HEMOSTASIS CLIP PLACEMENT;  Surgeon: Daneil Dolin, MD;  [facility];  Service: Endoscopy;;   IR PERC TUN PERIT CATH WO PORT S&I /IMAG  09/15/2020   IR REMOVAL TUN CV CATH W/O FL  02/19/2021   IR US GUIDE VASC ACCESS RIGHT  09/15/2020   POLYPECTOMY  04/24/2021   Procedure: POLYPECTOMY;  Surgeon: Eloise Harman, DO;  [facility];  Service: Endoscopy;;   POLYPECTOMY  05/14/2021   Procedure: POLYPECTOMY;  Surgeon: Daneil Dolin, MD;  [facility];  Service: Endoscopy;;   SKIN SPLIT GRAFT Bilateral 09/03/2021   Procedure: SKIN GRAFT BILATERAL LEGS;  Surgeon: Newt Minion, MD;  Location: [facility];  Service: Orthopedics;  Laterality: Bilateral;   TOE SURGERY     Social History   Occupational History   Not on file  Tobacco Use   Smoking status: Never   Smokeless tobacco: Never  Vaping Use   Vaping Use: Never used  Substance and Sexual Activity   Alcohol use: No   Drug use: No   Sexual activity: Yes    Birth control/protection: Condom

## 2021-11-04 ENCOUNTER — Ambulatory Visit (INDEPENDENT_AMBULATORY_CARE_PROVIDER_SITE_OTHER): Payer: Medicare Other | Admitting: Orthopedic Surgery

## 2021-11-04 ENCOUNTER — Encounter: Payer: Self-pay | Admitting: Orthopedic Surgery

## 2021-11-04 DIAGNOSIS — L97911 Non-pressure chronic ulcer of unspecified part of right lower leg limited to breakdown of skin: Secondary | ICD-10-CM | POA: Diagnosis not present

## 2021-11-04 DIAGNOSIS — L97923 Non-pressure chronic ulcer of unspecified part of left lower leg with necrosis of muscle: Secondary | ICD-10-CM

## 2021-11-04 NOTE — Progress Notes (Signed)
Office Visit Note   Patient: Amy Moses           Date of Birth: March 20, 1971           MRN: [id number] Visit Date: 11/04/2021              Requested by: Wynelle Cleveland, MD No address on file PCP: Wynelle Cleveland, MD  Chief Complaint  Patient presents with   Right Leg - Dressing Change   Left Leg - Dressing Change      HPI: Patient is a 50 year old woman who presents in follow-up status post debridement skin graft for calciphylaxis posterior aspect of both calves.  Assessment & Plan: Visit Diagnoses:  1. Calciphylaxis of right lower extremity with nonhealing ulcer, limited to breakdown of skin ([place])   2. Calciphylaxis of left lower extremity with nonhealing ulcer with necrosis of muscle ([place])     Plan: We will continue with twice a week dressing changes.  Follow-Up Instructions: Return in about 1 week (around 11/11/2021).   Ortho Exam  Patient is alert, oriented, no adenopathy, well-dressed, normal affect, normal respiratory effort. Examination of the right calf wound is almost completely healed.  There is good epithelization no bleeding.  The left calf wound does have some increased bleeding approximately 75% healthy granulation tissue.  Imaging: No results found. No images are attached to the encounter.  Labs: Lab Results  Component Value Date   HGBA1C 9.5 (H) 08/07/2021   HGBA1C 7.3 (H) 03/30/2021   HGBA1C 7.4 (H) 06/10/2020   ESRSEDRATE 40 (H) 08/07/2021   CRP 11.1 (H) 08/07/2021   REPTSTATUS 08/12/2021 FINAL 08/07/2021   GRAMSTAIN  07/08/2021    RARE WBC PRESENT,BOTH PMN AND MONONUCLEAR RARE GRAM POSITIVE RODS RARE GRAM POSITIVE COCCI IN PAIRS    CULT  08/07/2021    NO GROWTH 5 DAYS Performed at [facility], [place] [address] [place], [place] [address]    LABORGA CITROBACTER KOSERI 05/21/2021   LABORGA PROTEUS MIRABILIS 05/21/2021     Lab Results  Component Value Date   ALBUMIN 2.7 (L) 09/04/2021   ALBUMIN 2.4 (L) 08/12/2021    ALBUMIN 2.1 (L) 08/10/2021   PREALBUMIN 13.7 (L) 08/07/2021    Lab Results  Component Value Date   MG 2.1 05/14/2021   MG 2.8 (H) 05/11/2021   MG 2.3 04/23/2021   No results found for: VD25OH  Lab Results  Component Value Date   PREALBUMIN 13.7 (L) 08/07/2021   CBC EXTENDED Latest Ref Rng & Units 09/04/2021 09/03/2021 08/12/2021  WBC 4.0 - 10.5 K/uL 8.9 - 8.1  RBC 3.87 - 5.11 MIL/uL 3.49(L) - 4.21  HGB 12.0 - 15.0 g/dL 8.7(L) 11.2(L) 10.1(L)  HCT 36.0 - 46.0 % 29.8(L) 33.0(L) 33.4(L)  PLT 150 - 400 K/uL 276 - 294  NEUTROABS 1.7 - 7.7 K/uL - - -  LYMPHSABS 0.7 - 4.0 K/uL - - -     There is no height or weight on file to calculate BMI.  Orders:  No orders of the defined types were placed in this encounter.  No orders of the defined types were placed in this encounter.    Procedures: No procedures performed  Clinical Data: No additional findings.  ROS:  All other systems negative, except as noted in the HPI. Review of Systems  Objective: Vital Signs: LMP 08/22/2015 (Approximate)   Specialty Comments:  No specialty comments available.  PMFS History: Patient Active Problem List   Diagnosis Date Noted   Open leg wound  09/03/2021   Wound infection    Non-pressure chronic ulcer of right calf limited to breakdown of skin ([place])    Calciphylaxis of right lower extremity with nonhealing ulcer, limited to breakdown of skin ([place])    Chronic ulcer of left thigh ([place]) 08/07/2021   Calciphylaxis of left lower extremity with nonhealing ulcer with necrosis of muscle ([place]) 08/07/2021   Lower GI bleed 05/12/2021   Blindness 05/10/2021   Shingles 05/10/2021   Acute GI bleeding 04/24/2021   Acute blood loss anemia 04/23/2021   GI bleed 04/22/2021   Fever 03/31/2021   Acute on chronic heart failure with preserved ejection fraction (HFpEF) ([place]) 03/30/2021   Pressure injury of skin 03/30/2021   End stage renal disease ([place]) 11/16/2020   Calciphylaxis 11/06/2020    Non-healing open wound of heel 11/03/2020   Diabetic foot infection ([place]) 11/01/2020   Decubitus ulcer, heel 11/01/2020   Closed nondisplaced fracture of left patella 38/46/6599   Metabolic acidosis 35/70/1779   Acute on chronic renal failure ([place]) 06/10/2020   Symptomatic anemia 06/10/2020   Acute pericardial effusion 06/10/2020   Other acute nonsuppurative otitis media, unspecified ear 11/29/2019   Chronic kidney disease, stage 4 (severe) ([place]) 03/05/2019   Skin ulcer, limited to breakdown of skin ([place]) 01/28/2019   Vitamin D deficiency 01/28/2019   Uncontrolled type 2 diabetes mellitus with chronic kidney disease, with long-term current use of insulin 09/21/2015   Hyperlipidemia 09/21/2015   Essential hypertension, benign 09/21/2015   Primary hypothyroidism 09/21/2015   Iris bomb 07/31/2012   Secondary angle-closure glaucoma 07/31/2012   Past Medical History:  Diagnosis Date   Anemia    Blindness of right eye with low vision in contralateral eye    s/p victrectomy   Diabetes mellitus, type II ([place])    Dyslipidemia    Glaucoma    Hypertension    Hypothyroidism (acquired)    Kidney disease    Stage 5   Pneumonia     Family History  Problem Relation Age of Onset   Heart disease Mother    Diabetes Mother    Kidney disease Mother    Diabetes Father    Heart disease Father    Diabetes Brother    Colon cancer Neg Hx     Past Surgical History:  Procedure Laterality Date   ABDOMINAL AORTOGRAM W/LOWER EXTREMITY Bilateral 12/18/2020   Procedure: ABDOMINAL AORTOGRAM W/LOWER EXTREMITY;  Surgeon: Elam Dutch, MD;  Location: [facility];  Service: Cardiovascular;  Laterality: Bilateral;   ANKLE FRACTURE SURGERY Right    AV FISTULA PLACEMENT Left 08/18/2020   Procedure: LEFT ARM BRACHIOCEPHALIC ARTERIOVENOUS (AV) FISTULA CREATION;  Surgeon: Elam Dutch, MD;  Location: [facility];  Service: Vascular;  Laterality: Left;   BIOPSY  04/24/2021   Procedure: BIOPSY;   Surgeon: Eloise Harman, DO;  [facility];  Service: Endoscopy;;   CESAREAN SECTION     CHOLECYSTECTOMY     COLONOSCOPY  04/24/2021   Surgeon: Eloise Harman, DO;  nonbleeding internal hemorrhoids, 1 large (25 mm) pedunculated transverse colon polyp (prolapse type polyp) with adherent clot and stigmata of recent bleed.   COLONOSCOPY WITH PROPOFOL N/A 05/14/2021   Procedure: COLONOSCOPY WITH PROPOFOL;  Surgeon: Daneil Dolin, MD;  [facility];  Service: Endoscopy;  Laterality: N/A;   ESOPHAGOGASTRODUODENOSCOPY (EGD) WITH PROPOFOL N/A 04/24/2021   Surgeon: Eloise Harman, DO;  duodenal erosions and gastritis biopsied (pathology with peptic duodenitis, reactive gastropathy with erosions/chronic inflammation,  negative for H. pylori)   EYE SURGERY     Vatrectomy   HEMOSTASIS CLIP PLACEMENT  05/14/2021   Procedure: HEMOSTASIS CLIP PLACEMENT;  Surgeon: Daneil Dolin, MD;  [facility];  Service: Endoscopy;;   IR PERC TUN PERIT CATH WO PORT S&I /IMAG  09/15/2020   IR REMOVAL TUN CV CATH W/O FL  02/19/2021   IR US GUIDE VASC ACCESS RIGHT  09/15/2020   POLYPECTOMY  04/24/2021   Procedure: POLYPECTOMY;  Surgeon: Eloise Harman, DO;  [facility];  Service: Endoscopy;;   POLYPECTOMY  05/14/2021   Procedure: POLYPECTOMY;  Surgeon: Daneil Dolin, MD;  [facility];  Service: Endoscopy;;   SKIN SPLIT GRAFT Bilateral 09/03/2021   Procedure: SKIN GRAFT BILATERAL LEGS;  Surgeon: Newt Minion, MD;  Location: [facility];  Service: Orthopedics;  Laterality: Bilateral;   TOE SURGERY     Social History   Occupational History   Not on file  Tobacco Use   Smoking status: Never   Smokeless tobacco: Never  Vaping Use   Vaping Use: Never used  Substance and Sexual Activity   Alcohol use: No   Drug use: No   Sexual activity: Yes    Birth control/protection: Condom

## 2021-11-05 ENCOUNTER — Encounter: Payer: Self-pay | Admitting: Orthopedic Surgery

## 2021-11-08 ENCOUNTER — Telehealth: Payer: Self-pay | Admitting: Orthopedic Surgery

## 2021-11-08 ENCOUNTER — Ambulatory Visit: Payer: Medicare Other | Admitting: Orthopedic Surgery

## 2021-11-08 NOTE — Telephone Encounter (Signed)
I called pt and cx appt for today and moved to Tuesday and then cx appt for Thursday and moved to Friday.

## 2021-11-08 NOTE — Telephone Encounter (Signed)
Patient called. She can not come today. Would like to come tomorrow at 1pm

## 2021-11-09 ENCOUNTER — Other Ambulatory Visit: Payer: Self-pay

## 2021-11-09 ENCOUNTER — Ambulatory Visit (INDEPENDENT_AMBULATORY_CARE_PROVIDER_SITE_OTHER): Payer: Medicare Other | Admitting: Orthopedic Surgery

## 2021-11-09 DIAGNOSIS — L97911 Non-pressure chronic ulcer of unspecified part of right lower leg limited to breakdown of skin: Secondary | ICD-10-CM | POA: Diagnosis not present

## 2021-11-09 DIAGNOSIS — L97923 Non-pressure chronic ulcer of unspecified part of left lower leg with necrosis of muscle: Secondary | ICD-10-CM

## 2021-11-10 ENCOUNTER — Encounter: Payer: Self-pay | Admitting: Orthopedic Surgery

## 2021-11-10 NOTE — Progress Notes (Signed)
Office Visit Note   Patient: Amy Moses           Date of Birth: [birth year]           MRN: [id number] Visit Date: 10/04/2021              Requested by: Wynelle Cleveland, MD No address on file PCP: Wynelle Cleveland, MD  Chief Complaint  Patient presents with   Right Leg - Wound Check, Follow-up   Left Leg - Wound Check, Follow-up      HPI: Patient is a 50 year old woman who presents in follow-up status post biologic skin graft both lower extremities for calciphylaxis.  Assessment & Plan: Visit Diagnoses:  1. Calciphylaxis of right lower extremity with nonhealing ulcer, limited to breakdown of skin ([place])   2. Calciphylaxis of left lower extremity with nonhealing ulcer with necrosis of muscle ([place])     Plan: The ulcers on both heels have completely resolved.  The calf ulcers continue to improve.  Continue with compression wraps.  Follow-Up Instructions: Return in about 1 week (around 10/11/2021).   Ortho Exam  Patient is alert, oriented, no adenopathy, well-dressed, normal affect, normal respiratory effort. Examination there is healthy granulation tissue on both calf wounds.  The right calf wound is healing much quicker.  Imaging: No results found.     Labs: Lab Results  Component Value Date   HGBA1C 9.5 (H) 08/07/2021   HGBA1C 7.3 (H) 03/30/2021   HGBA1C 7.4 (H) 06/10/2020   ESRSEDRATE 40 (H) 08/07/2021   CRP 11.1 (H) 08/07/2021   REPTSTATUS 08/12/2021 FINAL 08/07/2021   GRAMSTAIN  07/08/2021    RARE WBC PRESENT,BOTH PMN AND MONONUCLEAR RARE GRAM POSITIVE RODS RARE GRAM POSITIVE COCCI IN PAIRS    CULT  08/07/2021    NO GROWTH 5 DAYS Performed at [facility], [place] [address], [place], [place] [address]    LABORGA CITROBACTER KOSERI 05/21/2021   LABORGA PROTEUS MIRABILIS 05/21/2021     Lab Results  Component Value Date   ALBUMIN 2.7 (L) 09/04/2021   ALBUMIN 2.4 (L) 08/12/2021   ALBUMIN 2.1 (L) 08/10/2021   PREALBUMIN 13.7 (L) 08/07/2021     Lab Results  Component Value Date   MG 2.1 05/14/2021   MG 2.8 (H) 05/11/2021   MG 2.3 04/23/2021   No results found for: VD25OH  Lab Results  Component Value Date   PREALBUMIN 13.7 (L) 08/07/2021   CBC EXTENDED Latest Ref Rng & Units 09/04/2021 09/03/2021 08/12/2021  WBC 4.0 - 10.5 K/uL 8.9 - 8.1  RBC 3.87 - 5.11 MIL/uL 3.49(L) - 4.21  HGB 12.0 - 15.0 g/dL 8.7(L) 11.2(L) 10.1(L)  HCT 36.0 - 46.0 % 29.8(L) 33.0(L) 33.4(L)  PLT 150 - 400 K/uL 276 - 294  NEUTROABS 1.7 - 7.7 K/uL - - -  LYMPHSABS 0.7 - 4.0 K/uL - - -     There is no height or weight on file to calculate BMI.  Orders:  No orders of the defined types were placed in this encounter.  No orders of the defined types were placed in this encounter.    Procedures: No procedures performed  Clinical Data: No additional findings.  ROS:  All other systems negative, except as noted in the HPI. Review of Systems  Objective: Vital Signs: LMP 08/22/2015 (Approximate)   Specialty Comments:  No specialty comments available.  PMFS History: Patient Active Problem List   Diagnosis Date Noted   Open leg wound 09/03/2021   Wound infection  Non-pressure chronic ulcer of right calf limited to breakdown of skin ([place])    Calciphylaxis of right lower extremity with nonhealing ulcer, limited to breakdown of skin ([place])    Chronic ulcer of left thigh ([place]) 08/07/2021   Calciphylaxis of left lower extremity with nonhealing ulcer with necrosis of muscle ([place]) 08/07/2021   Lower GI bleed 05/12/2021   Blindness 05/10/2021   Shingles 05/10/2021   Acute GI bleeding 04/24/2021   Acute blood loss anemia 04/23/2021   GI bleed 04/22/2021   Fever 03/31/2021   Acute on chronic heart failure with preserved ejection fraction (HFpEF) ([place]) 03/30/2021   Pressure injury of skin 03/30/2021   End stage renal disease ([place]) 11/16/2020   Calciphylaxis 11/06/2020   Non-healing open wound of heel 11/03/2020   Diabetic foot infection  ([employer]) 11/01/2020   Decubitus ulcer, heel 11/01/2020   Closed nondisplaced fracture of left patella 67/67/2094   Metabolic acidosis 70/96/2836   Acute on chronic renal failure ([place]) 06/10/2020   Symptomatic anemia 06/10/2020   Acute pericardial effusion 06/10/2020   Other acute nonsuppurative otitis media, unspecified ear 11/29/2019   Chronic kidney disease, stage 4 (severe) ([place]) 03/05/2019   Skin ulcer, limited to breakdown of skin ([place]) 01/28/2019   Vitamin D deficiency 01/28/2019   Uncontrolled type 2 diabetes mellitus with chronic kidney disease, with long-term current use of insulin 09/21/2015   Hyperlipidemia 09/21/2015   Essential hypertension, benign 09/21/2015   Primary hypothyroidism 09/21/2015   Iris bomb 07/31/2012   Secondary angle-closure glaucoma 07/31/2012   Past Medical History:  Diagnosis Date   Anemia    Blindness of right eye with low vision in contralateral eye    s/p victrectomy   Diabetes mellitus, type II ([place])    Dyslipidemia    Glaucoma    Hypertension    Hypothyroidism (acquired)    Kidney disease    Stage 5   Pneumonia     Family History  Problem Relation Age of Onset   Heart disease Mother    Diabetes Mother    Kidney disease Mother    Diabetes Father    Heart disease Father    Diabetes Brother    Colon cancer Neg Hx     Past Surgical History:  Procedure Laterality Date   ABDOMINAL AORTOGRAM W/LOWER EXTREMITY Bilateral 12/18/2020   Procedure: ABDOMINAL AORTOGRAM W/LOWER EXTREMITY;  Surgeon: Elam Dutch, MD;  Location: [facility];  Service: Cardiovascular;  Laterality: Bilateral;   ANKLE FRACTURE SURGERY Right    AV FISTULA PLACEMENT Left 08/18/2020   Procedure: LEFT ARM BRACHIOCEPHALIC ARTERIOVENOUS (AV) FISTULA CREATION;  Surgeon: Elam Dutch, MD;  Location: [facility];  Service: Vascular;  Laterality: Left;   BIOPSY  04/24/2021   Procedure: BIOPSY;  Surgeon: Eloise Harman, DO;  [facility];  Service:  Endoscopy;;   CESAREAN SECTION     CHOLECYSTECTOMY     COLONOSCOPY  04/24/2021   Surgeon: Eloise Harman, DO;  nonbleeding internal hemorrhoids, 1 large (25 mm) pedunculated transverse colon polyp (prolapse type polyp) with adherent clot and stigmata of recent bleed.   COLONOSCOPY WITH PROPOFOL N/A 05/14/2021   Procedure: COLONOSCOPY WITH PROPOFOL;  Surgeon: Daneil Dolin, MD;  [facility];  Service: Endoscopy;  Laterality: N/A;   ESOPHAGOGASTRODUODENOSCOPY (EGD) WITH PROPOFOL N/A 04/24/2021   Surgeon: Eloise Harman, DO;  duodenal erosions and gastritis biopsied (pathology with peptic duodenitis, reactive gastropathy with erosions/chronic inflammation, negative for H. pylori)   EYE SURGERY  Vatrectomy   HEMOSTASIS CLIP PLACEMENT  05/14/2021   Procedure: HEMOSTASIS CLIP PLACEMENT;  Surgeon: Daneil Dolin, MD;  [facility];  Service: Endoscopy;;   IR PERC TUN PERIT CATH WO PORT S&I /IMAG  09/15/2020   IR REMOVAL TUN CV CATH W/O FL  02/19/2021   IR US GUIDE VASC ACCESS RIGHT  09/15/2020   POLYPECTOMY  04/24/2021   Procedure: POLYPECTOMY;  Surgeon: Eloise Harman, DO;  [facility];  Service: Endoscopy;;   POLYPECTOMY  05/14/2021   Procedure: POLYPECTOMY;  Surgeon: Daneil Dolin, MD;  [facility];  Service: Endoscopy;;   SKIN SPLIT GRAFT Bilateral 09/03/2021   Procedure: SKIN GRAFT BILATERAL LEGS;  Surgeon: Newt Minion, MD;  Location: [facility];  Service: Orthopedics;  Laterality: Bilateral;   TOE SURGERY     Social History   Occupational History   Not on file  Tobacco Use   Smoking status: Never   Smokeless tobacco: Never  Vaping Use   Vaping Use: Never used  Substance and Sexual Activity   Alcohol use: No   Drug use: No   Sexual activity: Yes    Birth control/protection: Condom

## 2021-11-10 NOTE — Progress Notes (Signed)
Office Visit Note   Patient: Amy Moses           Date of Birth: 02/24/1971           MRN: [id number] Visit Date: 11/09/2021              Requested by: Wynelle Cleveland, MD No address on file PCP: Wynelle Cleveland, MD  Chief Complaint  Patient presents with   Right Leg - Follow-up   Left Leg - Follow-up      HPI: Patient is a 50 year old woman who presents in follow-up status post skin grafting for calciphylaxis both lower extremities.  Assessment & Plan: Visit Diagnoses:  1. Calciphylaxis of right lower extremity with nonhealing ulcer, limited to breakdown of skin ([place])   2. Calciphylaxis of left lower extremity with nonhealing ulcer with necrosis of muscle ([place])     Plan: Patient is showing excellent healing on the right slow healing on the left.  Continue with twice a week compression wrap changes.  Follow-Up Instructions: Return in about 1 week (around 11/16/2021).   Ortho Exam  Patient is alert, oriented, no adenopathy, well-dressed, normal affect, normal respiratory effort. Examination the right calf is almost completely healed.  There is no drainage at this time from the left calf and the wound bed shows approximately 75% healthy granulation tissue.  Imaging: No results found.    Labs: Lab Results  Component Value Date   HGBA1C 9.5 (H) 08/07/2021   HGBA1C 7.3 (H) 03/30/2021   HGBA1C 7.4 (H) 06/10/2020   ESRSEDRATE 40 (H) 08/07/2021   CRP 11.1 (H) 08/07/2021   REPTSTATUS 08/12/2021 FINAL 08/07/2021   GRAMSTAIN  07/08/2021    RARE WBC PRESENT,BOTH PMN AND MONONUCLEAR RARE GRAM POSITIVE RODS RARE GRAM POSITIVE COCCI IN PAIRS    CULT  08/07/2021    NO GROWTH 5 DAYS Performed at [facility], [place] [address] [place], [place] [address]    LABORGA CITROBACTER KOSERI 05/21/2021   LABORGA PROTEUS MIRABILIS 05/21/2021     Lab Results  Component Value Date   ALBUMIN 2.7 (L) 09/04/2021   ALBUMIN 2.4 (L) 08/12/2021   ALBUMIN 2.1 (L)  08/10/2021   PREALBUMIN 13.7 (L) 08/07/2021    Lab Results  Component Value Date   MG 2.1 05/14/2021   MG 2.8 (H) 05/11/2021   MG 2.3 04/23/2021   No results found for: VD25OH  Lab Results  Component Value Date   PREALBUMIN 13.7 (L) 08/07/2021   CBC EXTENDED Latest Ref Rng & Units 09/04/2021 09/03/2021 08/12/2021  WBC 4.0 - 10.5 K/uL 8.9 - 8.1  RBC 3.87 - 5.11 MIL/uL 3.49(L) - 4.21  HGB 12.0 - 15.0 g/dL 8.7(L) 11.2(L) 10.1(L)  HCT 36.0 - 46.0 % 29.8(L) 33.0(L) 33.4(L)  PLT 150 - 400 K/uL 276 - 294  NEUTROABS 1.7 - 7.7 K/uL - - -  LYMPHSABS 0.7 - 4.0 K/uL - - -     There is no height or weight on file to calculate BMI.  Orders:  No orders of the defined types were placed in this encounter.  No orders of the defined types were placed in this encounter.    Procedures: No procedures performed  Clinical Data: No additional findings.  ROS:  All other systems negative, except as noted in the HPI. Review of Systems  Objective: Vital Signs: LMP 08/22/2015 (Approximate)   Specialty Comments:  No specialty comments available.  PMFS History: Patient Active Problem List   Diagnosis Date Noted   Open leg  wound 09/03/2021   Wound infection    Non-pressure chronic ulcer of right calf limited to breakdown of skin ([place])    Calciphylaxis of right lower extremity with nonhealing ulcer, limited to breakdown of skin ([place])    Chronic ulcer of left thigh ([place]) 08/07/2021   Calciphylaxis of left lower extremity with nonhealing ulcer with necrosis of muscle ([place]) 08/07/2021   Lower GI bleed 05/12/2021   Blindness 05/10/2021   Shingles 05/10/2021   Acute GI bleeding 04/24/2021   Acute blood loss anemia 04/23/2021   GI bleed 04/22/2021   Fever 03/31/2021   Acute on chronic heart failure with preserved ejection fraction (HFpEF) ([place]) 03/30/2021   Pressure injury of skin 03/30/2021   End stage renal disease ([place]) 11/16/2020   Calciphylaxis 11/06/2020   Non-healing open wound  of heel 11/03/2020   Diabetic foot infection ([place]) 11/01/2020   Decubitus ulcer, heel 11/01/2020   Closed nondisplaced fracture of left patella 09/06/5101   Metabolic acidosis 58/52/7782   Acute on chronic renal failure ([place]) 06/10/2020   Symptomatic anemia 06/10/2020   Acute pericardial effusion 06/10/2020   Other acute nonsuppurative otitis media, unspecified ear 11/29/2019   Chronic kidney disease, stage 4 (severe) ([place]) 03/05/2019   Skin ulcer, limited to breakdown of skin ([place]) 01/28/2019   Vitamin D deficiency 01/28/2019   Uncontrolled type 2 diabetes mellitus with chronic kidney disease, with long-term current use of insulin 09/21/2015   Hyperlipidemia 09/21/2015   Essential hypertension, benign 09/21/2015   Primary hypothyroidism 09/21/2015   Iris bomb 07/31/2012   Secondary angle-closure glaucoma 07/31/2012   Past Medical History:  Diagnosis Date   Anemia    Blindness of right eye with low vision in contralateral eye    s/p victrectomy   Diabetes mellitus, type II ([place])    Dyslipidemia    Glaucoma    Hypertension    Hypothyroidism (acquired)    Kidney disease    Stage 5   Pneumonia     Family History  Problem Relation Age of Onset   Heart disease Mother    Diabetes Mother    Kidney disease Mother    Diabetes Father    Heart disease Father    Diabetes Brother    Colon cancer Neg Hx     Past Surgical History:  Procedure Laterality Date   ABDOMINAL AORTOGRAM W/LOWER EXTREMITY Bilateral 12/18/2020   Procedure: ABDOMINAL AORTOGRAM W/LOWER EXTREMITY;  Surgeon: Elam Dutch, MD;  Location: [facility];  Service: Cardiovascular;  Laterality: Bilateral;   ANKLE FRACTURE SURGERY Right    AV FISTULA PLACEMENT Left 08/18/2020   Procedure: LEFT ARM BRACHIOCEPHALIC ARTERIOVENOUS (AV) FISTULA CREATION;  Surgeon: Elam Dutch, MD;  Location: [facility];  Service: Vascular;  Laterality: Left;   BIOPSY  04/24/2021   Procedure: BIOPSY;  Surgeon: Eloise Harman, DO;  [facility];  Service: Endoscopy;;   CESAREAN SECTION     CHOLECYSTECTOMY     COLONOSCOPY  04/24/2021   Surgeon: Eloise Harman, DO;  nonbleeding internal hemorrhoids, 1 large (25 mm) pedunculated transverse colon polyp (prolapse type polyp) with adherent clot and stigmata of recent bleed.   COLONOSCOPY WITH PROPOFOL N/A 05/14/2021   Procedure: COLONOSCOPY WITH PROPOFOL;  Surgeon: Daneil Dolin, MD;  [facility];  Service: Endoscopy;  Laterality: N/A;   ESOPHAGOGASTRODUODENOSCOPY (EGD) WITH PROPOFOL N/A 04/24/2021   Surgeon: Eloise Harman, DO;  duodenal erosions and gastritis biopsied (pathology with peptic duodenitis, reactive gastropathy with erosions/chronic  inflammation, negative for H. pylori)   EYE SURGERY     Vatrectomy   HEMOSTASIS CLIP PLACEMENT  05/14/2021   Procedure: HEMOSTASIS CLIP PLACEMENT;  Surgeon: Daneil Dolin, MD;  [facility];  Service: Endoscopy;;   IR PERC TUN PERIT CATH WO PORT S&I /IMAG  09/15/2020   IR REMOVAL TUN CV CATH W/O FL  02/19/2021   IR US GUIDE VASC ACCESS RIGHT  09/15/2020   POLYPECTOMY  04/24/2021   Procedure: POLYPECTOMY;  Surgeon: Eloise Harman, DO;  [facility];  Service: Endoscopy;;   POLYPECTOMY  05/14/2021   Procedure: POLYPECTOMY;  Surgeon: Daneil Dolin, MD;  [facility];  Service: Endoscopy;;   SKIN SPLIT GRAFT Bilateral 09/03/2021   Procedure: SKIN GRAFT BILATERAL LEGS;  Surgeon: Newt Minion, MD;  Location: [facility];  Service: Orthopedics;  Laterality: Bilateral;   TOE SURGERY     Social History   Occupational History   Not on file  Tobacco Use   Smoking status: Never   Smokeless tobacco: Never  Vaping Use   Vaping Use: Never used  Substance and Sexual Activity   Alcohol use: No   Drug use: No   Sexual activity: Yes    Birth control/protection: Condom

## 2021-11-11 ENCOUNTER — Ambulatory Visit: Payer: Medicare Other | Admitting: Orthopedic Surgery

## 2021-11-12 ENCOUNTER — Ambulatory Visit: Payer: Medicare Other | Admitting: Family

## 2021-11-16 ENCOUNTER — Ambulatory Visit: Payer: Medicare Other

## 2021-11-18 ENCOUNTER — Ambulatory Visit: Payer: Medicare Other | Admitting: Orthopedic Surgery

## 2021-11-23 ENCOUNTER — Other Ambulatory Visit: Payer: Self-pay

## 2021-11-23 ENCOUNTER — Ambulatory Visit (INDEPENDENT_AMBULATORY_CARE_PROVIDER_SITE_OTHER): Payer: Medicare Other | Admitting: Orthopedic Surgery

## 2021-11-23 ENCOUNTER — Encounter: Payer: Self-pay | Admitting: Orthopedic Surgery

## 2021-11-23 DIAGNOSIS — L97923 Non-pressure chronic ulcer of unspecified part of left lower leg with necrosis of muscle: Secondary | ICD-10-CM

## 2021-11-23 DIAGNOSIS — L97911 Non-pressure chronic ulcer of unspecified part of right lower leg limited to breakdown of skin: Secondary | ICD-10-CM

## 2021-11-23 NOTE — Progress Notes (Signed)
Office Visit Note   Patient: Amy Moses           Date of Birth: 15-Jan-1971           MRN: [id number] Visit Date: 11/23/2021              Requested by: Wynelle Cleveland, MD No address on file PCP: Wynelle Cleveland, MD  Chief Complaint  Patient presents with   Left Leg - Follow-up   Right Leg - Follow-up      HPI: Patient is a 51 year old woman who presents in follow-up status post skin grafting for calciphylaxis both lower extremities.  Assessment & Plan: Visit Diagnoses:  1. Calciphylaxis of right lower extremity with nonhealing ulcer, limited to breakdown of skin ([place])   2. Calciphylaxis of left lower extremity with nonhealing ulcer with necrosis of muscle ([place])     Plan: the patient continues to show improvement in lower leg ulcers. Right is nearly healed. Is to discuss repeat debridment and grafting of LLE at next visit with dr duda. Dynaflex applied bilaterally today.  Follow-Up Instructions: Return in about 1 week (around 11/30/2021).   Ortho Exam  Patient is alert, oriented, no adenopathy, well-dressed, normal affect, normal respiratory effort. Examination the right calf is almost completely healed. 5 mm in diameter open area. This is with granulation. No surrounding erythema or warmth. No odor. the left calf and the wound bed shows approximately 85 % healthy granulation tissue. Bleeding from distal aspect of bed. No erythema, warmth or infection.  Two staples remaining, were harvested today.  Imaging: No results found.    Labs: Lab Results  Component Value Date   HGBA1C 9.5 (H) 08/07/2021   HGBA1C 7.3 (H) 03/30/2021   HGBA1C 7.4 (H) 06/10/2020   ESRSEDRATE 40 (H) 08/07/2021   CRP 11.1 (H) 08/07/2021   REPTSTATUS 08/12/2021 FINAL 08/07/2021   GRAMSTAIN  07/08/2021    RARE WBC PRESENT,BOTH PMN AND MONONUCLEAR RARE GRAM POSITIVE RODS RARE GRAM POSITIVE COCCI IN PAIRS    CULT  08/07/2021    NO GROWTH 5 DAYS Performed at [facility],  [place] [address] [place], [place] [address]    LABORGA CITROBACTER KOSERI 05/21/2021   LABORGA PROTEUS MIRABILIS 05/21/2021     Lab Results  Component Value Date   ALBUMIN 2.7 (L) 09/04/2021   ALBUMIN 2.4 (L) 08/12/2021   ALBUMIN 2.1 (L) 08/10/2021   PREALBUMIN 13.7 (L) 08/07/2021    Lab Results  Component Value Date   MG 2.1 05/14/2021   MG 2.8 (H) 05/11/2021   MG 2.3 04/23/2021   No results found for: VD25OH  Lab Results  Component Value Date   PREALBUMIN 13.7 (L) 08/07/2021   CBC EXTENDED Latest Ref Rng & Units 09/04/2021 09/03/2021 08/12/2021  WBC 4.0 - 10.5 K/uL 8.9 - 8.1  RBC 3.87 - 5.11 MIL/uL 3.49(L) - 4.21  HGB 12.0 - 15.0 g/dL 8.7(L) 11.2(L) 10.1(L)  HCT 36.0 - 46.0 % 29.8(L) 33.0(L) 33.4(L)  PLT 150 - 400 K/uL 276 - 294  NEUTROABS 1.7 - 7.7 K/uL - - -  LYMPHSABS 0.7 - 4.0 K/uL - - -     There is no height or weight on file to calculate BMI.  Orders:  No orders of the defined types were placed in this encounter.  No orders of the defined types were placed in this encounter.    Procedures: No procedures performed  Clinical Data: No additional findings.  ROS:  All other systems negative, except as noted  in the HPI. Review of Systems  Objective: Vital Signs: LMP 08/22/2015 (Approximate)   Specialty Comments:  No specialty comments available.  PMFS History: Patient Active Problem List   Diagnosis Date Noted   Open leg wound 09/03/2021   Wound infection    Non-pressure chronic ulcer of right calf limited to breakdown of skin ([place])    Calciphylaxis of right lower extremity with nonhealing ulcer, limited to breakdown of skin ([place])    Chronic ulcer of left thigh ([place]) 08/07/2021   Calciphylaxis of left lower extremity with nonhealing ulcer with necrosis of muscle ([place]) 08/07/2021   Lower GI bleed 05/12/2021   Blindness 05/10/2021   Shingles 05/10/2021   Acute GI bleeding 04/24/2021   Acute blood loss anemia 04/23/2021   GI bleed 04/22/2021    Fever 03/31/2021   Acute on chronic heart failure with preserved ejection fraction (HFpEF) ([place]) 03/30/2021   Pressure injury of skin 03/30/2021   End stage renal disease ([place]) 11/16/2020   Calciphylaxis 11/06/2020   Non-healing open wound of heel 11/03/2020   Diabetic foot infection ([place]) 11/01/2020   Decubitus ulcer, heel 11/01/2020   Closed nondisplaced fracture of left patella 96/75/9163   Metabolic acidosis 84/66/5993   Acute on chronic renal failure ([place]) 06/10/2020   Symptomatic anemia 06/10/2020   Acute pericardial effusion 06/10/2020   Other acute nonsuppurative otitis media, unspecified ear 11/29/2019   Chronic kidney disease, stage 4 (severe) ([place]) 03/05/2019   Skin ulcer, limited to breakdown of skin ([place]) 01/28/2019   Vitamin D deficiency 01/28/2019   Uncontrolled type 2 diabetes mellitus with chronic kidney disease, with long-term current use of insulin 09/21/2015   Hyperlipidemia 09/21/2015   Essential hypertension, benign 09/21/2015   Primary hypothyroidism 09/21/2015   Iris bomb 07/31/2012   Secondary angle-closure glaucoma 07/31/2012   Past Medical History:  Diagnosis Date   Anemia    Blindness of right eye with low vision in contralateral eye    s/p victrectomy   Diabetes mellitus, type II ([place])    Dyslipidemia    Glaucoma    Hypertension    Hypothyroidism (acquired)    Kidney disease    Stage 5   Pneumonia     Family History  Problem Relation Age of Onset   Heart disease Mother    Diabetes Mother    Kidney disease Mother    Diabetes Father    Heart disease Father    Diabetes Brother    Colon cancer Neg Hx     Past Surgical History:  Procedure Laterality Date   ABDOMINAL AORTOGRAM W/LOWER EXTREMITY Bilateral 12/18/2020   Procedure: ABDOMINAL AORTOGRAM W/LOWER EXTREMITY;  Surgeon: Elam Dutch, MD;  Location: [facility];  Service: Cardiovascular;  Laterality: Bilateral;   ANKLE FRACTURE SURGERY Right    AV FISTULA PLACEMENT Left  08/18/2020   Procedure: LEFT ARM BRACHIOCEPHALIC ARTERIOVENOUS (AV) FISTULA CREATION;  Surgeon: Elam Dutch, MD;  Location: [facility];  Service: Vascular;  Laterality: Left;   BIOPSY  04/24/2021   Procedure: BIOPSY;  Surgeon: Eloise Harman, DO;  [facility];  Service: Endoscopy;;   CESAREAN SECTION     CHOLECYSTECTOMY     COLONOSCOPY  04/24/2021   Surgeon: Eloise Harman, DO;  nonbleeding internal hemorrhoids, 1 large (25 mm) pedunculated transverse colon polyp (prolapse type polyp) with adherent clot and stigmata of recent bleed.   COLONOSCOPY WITH PROPOFOL N/A 05/14/2021   Procedure: COLONOSCOPY WITH PROPOFOL;  Surgeon: Daneil Dolin, MD;  [facility];  Service: Endoscopy;  Laterality: N/A;   ESOPHAGOGASTRODUODENOSCOPY (EGD) WITH PROPOFOL N/A 04/24/2021   Surgeon: Eloise Harman, DO;  duodenal erosions and gastritis biopsied (pathology with peptic duodenitis, reactive gastropathy with erosions/chronic inflammation, negative for H. pylori)   EYE SURGERY     Vatrectomy   HEMOSTASIS CLIP PLACEMENT  05/14/2021   Procedure: HEMOSTASIS CLIP PLACEMENT;  Surgeon: Daneil Dolin, MD;  [facility];  Service: Endoscopy;;   IR PERC TUN PERIT CATH WO PORT S&I /IMAG  09/15/2020   IR REMOVAL TUN CV CATH W/O FL  02/19/2021   IR US GUIDE VASC ACCESS RIGHT  09/15/2020   POLYPECTOMY  04/24/2021   Procedure: POLYPECTOMY;  Surgeon: Eloise Harman, DO;  [facility];  Service: Endoscopy;;   POLYPECTOMY  05/14/2021   Procedure: POLYPECTOMY;  Surgeon: Daneil Dolin, MD;  [facility];  Service: Endoscopy;;   SKIN SPLIT GRAFT Bilateral 09/03/2021   Procedure: SKIN GRAFT BILATERAL LEGS;  Surgeon: Newt Minion, MD;  Location: [facility];  Service: Orthopedics;  Laterality: Bilateral;   TOE SURGERY     Social History   Occupational History   Not on file  Tobacco Use   Smoking status: Never   Smokeless tobacco: Never  Vaping Use    Vaping Use: Never used  Substance and Sexual Activity   Alcohol use: No   Drug use: No   Sexual activity: Yes    Birth control/protection: Condom

## 2021-11-24 ENCOUNTER — Telehealth: Payer: Self-pay | Admitting: Orthopedic Surgery

## 2021-11-24 NOTE — Telephone Encounter (Signed)
This paperwork is pending Dr. Jess Barters signature. Will have signed tomorrow and fax.

## 2021-11-24 NOTE — Telephone Encounter (Signed)
Phyllis with [employer] called stating the pt was approved for a certain type of mattress that she no longer is qualified for. Silva Bandy states she sent an order to get the mattress down graded but hasn't gotten the order back. She would like for Dr.Duda to fill out the form and fax it back as soon as possible and she would like a CB when this has been done please.  [phone] Fax# [phone]

## 2021-11-29 ENCOUNTER — Other Ambulatory Visit: Payer: Self-pay

## 2021-11-29 ENCOUNTER — Encounter: Payer: Self-pay | Admitting: Orthopedic Surgery

## 2021-11-29 ENCOUNTER — Ambulatory Visit (INDEPENDENT_AMBULATORY_CARE_PROVIDER_SITE_OTHER): Payer: Medicare Other | Admitting: Orthopedic Surgery

## 2021-11-29 DIAGNOSIS — L97923 Non-pressure chronic ulcer of unspecified part of left lower leg with necrosis of muscle: Secondary | ICD-10-CM | POA: Diagnosis not present

## 2021-11-29 DIAGNOSIS — L97911 Non-pressure chronic ulcer of unspecified part of right lower leg limited to breakdown of skin: Secondary | ICD-10-CM

## 2021-11-29 NOTE — Progress Notes (Signed)
Office Visit Note   Patient: Amy Moses           Date of Birth: 06-26-1971           MRN: [id number] Visit Date: 11/29/2021              Requested by: Wynelle Cleveland, MD No address on file PCP: Wynelle Cleveland, MD  Chief Complaint  Patient presents with   Left Leg - Routine Post Op    09/03/2021 bilateral leg STSG   Right Leg - Routine Post Op      HPI: Patient is a 51 year old woman who presents in follow-up status post split-thickness skin graft for calciphylaxis ulceration posterior calfs bilaterally.  Patient has no complaints.  She has dialysis Tuesday Thursday Saturday.  Assessment & Plan: Visit Diagnoses: No diagnosis found.  Plan: Patient would like to proceed with additional skin skin graft to the residual wound left lower extremity.  We will plan for surgery 1 week from this Friday with dialysis in the hospital on Saturday.  With discharge to home after dialysis.  Follow-Up Instructions: No follow-ups on file.   Ortho Exam  Patient is alert, oriented, no adenopathy, well-dressed, normal affect, normal respiratory effort. Examination the right lower extremity is essentially healed there is excellent epithelization.  Left lower extremity still shows a large persistent wound with a good healthy wound bed.  This should improve significantly with an additional skin graft treatment.  Imaging: No results found.   Labs: Lab Results  Component Value Date   HGBA1C 9.5 (H) 08/07/2021   HGBA1C 7.3 (H) 03/30/2021   HGBA1C 7.4 (H) 06/10/2020   ESRSEDRATE 40 (H) 08/07/2021   CRP 11.1 (H) 08/07/2021   REPTSTATUS 08/12/2021 FINAL 08/07/2021   GRAMSTAIN  07/08/2021    RARE WBC PRESENT,BOTH PMN AND MONONUCLEAR RARE GRAM POSITIVE RODS RARE GRAM POSITIVE COCCI IN PAIRS    CULT  08/07/2021    NO GROWTH 5 DAYS Performed at [facility], [place] [address], [place], [place] [address]    LABORGA CITROBACTER KOSERI 05/21/2021   LABORGA PROTEUS MIRABILIS  05/21/2021     Lab Results  Component Value Date   ALBUMIN 2.7 (L) 09/04/2021   ALBUMIN 2.4 (L) 08/12/2021   ALBUMIN 2.1 (L) 08/10/2021   PREALBUMIN 13.7 (L) 08/07/2021    Lab Results  Component Value Date   MG 2.1 05/14/2021   MG 2.8 (H) 05/11/2021   MG 2.3 04/23/2021   No results found for: VD25OH  Lab Results  Component Value Date   PREALBUMIN 13.7 (L) 08/07/2021   CBC EXTENDED Latest Ref Rng & Units 09/04/2021 09/03/2021 08/12/2021  WBC 4.0 - 10.5 K/uL 8.9 - 8.1  RBC 3.87 - 5.11 MIL/uL 3.49(L) - 4.21  HGB 12.0 - 15.0 g/dL 8.7(L) 11.2(L) 10.1(L)  HCT 36.0 - 46.0 % 29.8(L) 33.0(L) 33.4(L)  PLT 150 - 400 K/uL 276 - 294  NEUTROABS 1.7 - 7.7 K/uL - - -  LYMPHSABS 0.7 - 4.0 K/uL - - -     There is no height or weight on file to calculate BMI.  Orders:  No orders of the defined types were placed in this encounter.  No orders of the defined types were placed in this encounter.    Procedures: No procedures performed  Clinical Data: No additional findings.  ROS:  All other systems negative, except as noted in the HPI. Review of Systems  Objective: Vital Signs: LMP 08/22/2015 (Approximate)   Specialty Comments:  No specialty  comments available.  PMFS History: Patient Active Problem List   Diagnosis Date Noted   Open leg wound 09/03/2021   Wound infection    Non-pressure chronic ulcer of right calf limited to breakdown of skin ([place])    Calciphylaxis of right lower extremity with nonhealing ulcer, limited to breakdown of skin ([place])    Chronic ulcer of left thigh ([place]) 08/07/2021   Calciphylaxis of left lower extremity with nonhealing ulcer with necrosis of muscle ([place]) 08/07/2021   Lower GI bleed 05/12/2021   Blindness 05/10/2021   Shingles 05/10/2021   Acute GI bleeding 04/24/2021   Acute blood loss anemia 04/23/2021   GI bleed 04/22/2021   Fever 03/31/2021   Acute on chronic heart failure with preserved ejection fraction (HFpEF) ([place]) 03/30/2021    Pressure injury of skin 03/30/2021   End stage renal disease ([place]) 11/16/2020   Calciphylaxis 11/06/2020   Non-healing open wound of heel 11/03/2020   Diabetic foot infection ([place]) 11/01/2020   Decubitus ulcer, heel 11/01/2020   Closed nondisplaced fracture of left patella 51/76/1607   Metabolic acidosis 37/08/6268   Acute on chronic renal failure ([place]) 06/10/2020   Symptomatic anemia 06/10/2020   Acute pericardial effusion 06/10/2020   Other acute nonsuppurative otitis media, unspecified ear 11/29/2019   Chronic kidney disease, stage 4 (severe) ([place]) 03/05/2019   Skin ulcer, limited to breakdown of skin ([place]) 01/28/2019   Vitamin D deficiency 01/28/2019   Uncontrolled type 2 diabetes mellitus with chronic kidney disease, with long-term current use of insulin 09/21/2015   Hyperlipidemia 09/21/2015   Essential hypertension, benign 09/21/2015   Primary hypothyroidism 09/21/2015   Iris bomb 07/31/2012   Secondary angle-closure glaucoma 07/31/2012   Past Medical History:  Diagnosis Date   Anemia    Blindness of right eye with low vision in contralateral eye    s/p victrectomy   Diabetes mellitus, type II ([place])    Dyslipidemia    Glaucoma    Hypertension    Hypothyroidism (acquired)    Kidney disease    Stage 5   Pneumonia     Family History  Problem Relation Age of Onset   Heart disease Mother    Diabetes Mother    Kidney disease Mother    Diabetes Father    Heart disease Father    Diabetes Brother    Colon cancer Neg Hx     Past Surgical History:  Procedure Laterality Date   ABDOMINAL AORTOGRAM W/LOWER EXTREMITY Bilateral 12/18/2020   Procedure: ABDOMINAL AORTOGRAM W/LOWER EXTREMITY;  Surgeon: Elam Dutch, MD;  Location: [facility];  Service: Cardiovascular;  Laterality: Bilateral;   ANKLE FRACTURE SURGERY Right    AV FISTULA PLACEMENT Left 08/18/2020   Procedure: LEFT ARM BRACHIOCEPHALIC ARTERIOVENOUS (AV) FISTULA CREATION;  Surgeon: Elam Dutch,  MD;  Location: [facility];  Service: Vascular;  Laterality: Left;   BIOPSY  04/24/2021   Procedure: BIOPSY;  Surgeon: Eloise Harman, DO;  [facility];  Service: Endoscopy;;   CESAREAN SECTION     CHOLECYSTECTOMY     COLONOSCOPY  04/24/2021   Surgeon: Eloise Harman, DO;  nonbleeding internal hemorrhoids, 1 large (25 mm) pedunculated transverse colon polyp (prolapse type polyp) with adherent clot and stigmata of recent bleed.   COLONOSCOPY WITH PROPOFOL N/A 05/14/2021   Procedure: COLONOSCOPY WITH PROPOFOL;  Surgeon: Daneil Dolin, MD;  [facility];  Service: Endoscopy;  Laterality: N/A;   ESOPHAGOGASTRODUODENOSCOPY (EGD) WITH PROPOFOL N/A 04/24/2021   Surgeon:  Eloise Harman, DO;  duodenal erosions and gastritis biopsied (pathology with peptic duodenitis, reactive gastropathy with erosions/chronic inflammation, negative for H. pylori)   EYE SURGERY     Vatrectomy   HEMOSTASIS CLIP PLACEMENT  05/14/2021   Procedure: HEMOSTASIS CLIP PLACEMENT;  Surgeon: Daneil Dolin, MD;  [facility];  Service: Endoscopy;;   IR PERC TUN PERIT CATH WO PORT S&I /IMAG  09/15/2020   IR REMOVAL TUN CV CATH W/O FL  02/19/2021   IR US GUIDE VASC ACCESS RIGHT  09/15/2020   POLYPECTOMY  04/24/2021   Procedure: POLYPECTOMY;  Surgeon: Eloise Harman, DO;  [facility];  Service: Endoscopy;;   POLYPECTOMY  05/14/2021   Procedure: POLYPECTOMY;  Surgeon: Daneil Dolin, MD;  [facility];  Service: Endoscopy;;   SKIN SPLIT GRAFT Bilateral 09/03/2021   Procedure: SKIN GRAFT BILATERAL LEGS;  Surgeon: Newt Minion, MD;  Location: [facility];  Service: Orthopedics;  Laterality: Bilateral;   TOE SURGERY     Social History   Occupational History   Not on file  Tobacco Use   Smoking status: Never   Smokeless tobacco: Never  Vaping Use   Vaping Use: Never used  Substance and Sexual Activity   Alcohol use: No   Drug use: No   Sexual activity: Yes     Birth control/protection: Condom

## 2021-12-06 ENCOUNTER — Other Ambulatory Visit: Payer: Self-pay

## 2021-12-06 ENCOUNTER — Ambulatory Visit: Payer: Medicare Other

## 2021-12-06 NOTE — Progress Notes (Signed)
Patient is s/p bilateral lower extremity debridement and STSG application for calciphylaxis ulceration on 09/03/2021. Pt comes into the office weekly for bilateral compression dressing change. As per instruction last week she is here today to have dressings removed and reapplied and also will be scheduled for LLE skin graft this Friday, 12/10/2021.  Dressings reapplied without complication and pt will call with any questions or concerns. Otherwise will be scheduled for surgery on Friday.   Cassidee Deats, [place], [employer]

## 2021-12-09 ENCOUNTER — Encounter (HOSPITAL_COMMUNITY): Payer: Self-pay | Admitting: Orthopedic Surgery

## 2021-12-09 ENCOUNTER — Other Ambulatory Visit: Payer: Self-pay

## 2021-12-09 MED ORDER — TRANEXAMIC ACID 1000 MG/10ML IV SOLN
2000.0000 mg | INTRAVENOUS | Status: DC
  Filled 2021-12-09: qty 20

## 2021-12-09 NOTE — Progress Notes (Signed)
Ms Schermer denies chest pain or shortness of breath. Patient denies having any s/s of Covid in her household.  Patient denies any known exposure to Covid.   Ms Toste has type II diabetes. Patient states that CBG's have been running around 250, Ms Savino attributes it to intake. I encourage patient to try to keep CBG < 180 after surgery to help prevent infection and not slow down healing.  ,I instructed patient to check CBG after awaking and every 2 hours until arrival  to the hospital.  I Instructed patient if CBG is less than 70 to take 4 Glucose Tablets or 1 tube of Glucose Gel or 1/2 cup of a clear juice. Recheck CBG in 15 minutes if CBG is not over 70 call, pre- op desk at [phone] for further instructions. I instructed Ms Burrough to call the Pre- Surgery if CBG is >220, ask if you need to take any Novolog Insulin,which is normally for meal coverage.  I instructed Ms Music  to wah up well with with antibiotic soap, if it is available.  Dry off with a clean towel. Do not put lotion, powder, cologne or deodorant or makeup.No jewelry or piercings. Men may shave their face and neck. Woman should not shave. No nail polish, artificial or acrylic nails. Wear clean clothes, brush your teeth. Glasses, contact lens,dentures or partials may not be worn in the OR. If you need to wear them, please bring a case for glasses, do not wear contacts or bring a case, the hospital does not have contact cases, dentures or partials will have to be removed , make sure they are clean, we will provide a denture cup to put them in. You will need some one to drive you home and a responsible person over the age of 35 to stay with you for the first 24 hours after surgery.   PCP is Dr. Montez Hageman with [facility] in [place], [place]. Nephrologist is Dr. Chase Picket in [place], [place].

## 2021-12-10 ENCOUNTER — Ambulatory Visit (HOSPITAL_COMMUNITY): Payer: Medicare Other | Admitting: Anesthesiology

## 2021-12-10 ENCOUNTER — Observation Stay (HOSPITAL_COMMUNITY)
Admission: RE | Admit: 2021-12-10 | Discharge: 2021-12-12 | Disposition: A | Discharge status: Hospice | Payer: Medicare Other | Attending: Orthopedic Surgery | Admitting: Orthopedic Surgery

## 2021-12-10 ENCOUNTER — Other Ambulatory Visit: Payer: Self-pay

## 2021-12-10 ENCOUNTER — Encounter (HOSPITAL_COMMUNITY)
Admission: RE | Disposition: A | Discharge status: Home / Self Care | Payer: Self-pay | Source: Home / Self Care | Attending: Orthopedic Surgery

## 2021-12-10 ENCOUNTER — Encounter (HOSPITAL_COMMUNITY): Payer: Self-pay | Admitting: Orthopedic Surgery

## 2021-12-10 DIAGNOSIS — E1122 Type 2 diabetes mellitus with diabetic chronic kidney disease: Secondary | ICD-10-CM | POA: Diagnosis not present

## 2021-12-10 DIAGNOSIS — K219 Gastro-esophageal reflux disease without esophagitis: Secondary | ICD-10-CM | POA: Insufficient documentation

## 2021-12-10 DIAGNOSIS — S81802A Unspecified open wound, left lower leg, initial encounter: Principal | ICD-10-CM | POA: Insufficient documentation

## 2021-12-10 DIAGNOSIS — Z79899 Other long term (current) drug therapy: Secondary | ICD-10-CM | POA: Diagnosis not present

## 2021-12-10 DIAGNOSIS — X58XXXA Exposure to other specified factors, initial encounter: Secondary | ICD-10-CM | POA: Insufficient documentation

## 2021-12-10 DIAGNOSIS — E039 Hypothyroidism, unspecified: Secondary | ICD-10-CM | POA: Diagnosis not present

## 2021-12-10 DIAGNOSIS — N185 Chronic kidney disease, stage 5: Secondary | ICD-10-CM | POA: Insufficient documentation

## 2021-12-10 DIAGNOSIS — I120 Hypertensive chronic kidney disease with stage 5 chronic kidney disease or end stage renal disease: Secondary | ICD-10-CM | POA: Insufficient documentation

## 2021-12-10 DIAGNOSIS — E669 Obesity, unspecified: Secondary | ICD-10-CM | POA: Insufficient documentation

## 2021-12-10 DIAGNOSIS — Z794 Long term (current) use of insulin: Secondary | ICD-10-CM | POA: Diagnosis not present

## 2021-12-10 DIAGNOSIS — Z6832 Body mass index (BMI) 32.0-32.9, adult: Secondary | ICD-10-CM | POA: Insufficient documentation

## 2021-12-10 DIAGNOSIS — S81802S Unspecified open wound, left lower leg, sequela: Secondary | ICD-10-CM

## 2021-12-10 HISTORY — PX: SKIN SPLIT GRAFT: SHX444

## 2021-12-10 HISTORY — DX: Personal history of other medical treatment: Z92.89

## 2021-12-10 LAB — BASIC METABOLIC PANEL
Anion gap: 11 (ref 5–15)
BUN: 26 mg/dL — ABNORMAL HIGH (ref 6–20)
CO2: 26 mmol/L (ref 22–32)
Calcium: 9.4 mg/dL (ref 8.9–10.3)
Chloride: 100 mmol/L (ref 98–111)
Creatinine, Ser: 3.8 mg/dL — ABNORMAL HIGH (ref 0.44–1.00)
GFR, Estimated: 14 mL/min — ABNORMAL LOW (ref >60)
Glucose, Bld: 144 mg/dL — ABNORMAL HIGH (ref 70–99)
Potassium: 4 mmol/L (ref 3.5–5.1)
Sodium: 137 mmol/L (ref 135–145)

## 2021-12-10 LAB — SURGICAL PCR SCREEN
MRSA, PCR: NEGATIVE
Staphylococcus aureus: NEGATIVE

## 2021-12-10 LAB — CBC
HCT: 41.1 % (ref 36.0–46.0)
Hemoglobin: 12.6 g/dL (ref 12.0–15.0)
MCH: 27.4 pg (ref 26.0–34.0)
MCHC: 30.7 g/dL (ref 30.0–36.0)
MCV: 89.3 fL (ref 80.0–100.0)
Platelets: 180 10*3/uL (ref 150–400)
RBC: 4.6 MIL/uL (ref 3.87–5.11)
RDW: 14.9 % (ref 11.5–15.5)
WBC: 6.5 10*3/uL (ref 4.0–10.5)
nRBC: 0 % (ref 0.0–0.2)

## 2021-12-10 LAB — GLUCOSE, CAPILLARY
Glucose-Capillary: 168 mg/dL — ABNORMAL HIGH (ref 70–99)
Glucose-Capillary: 126 mg/dL — ABNORMAL HIGH (ref 70–99)
Glucose-Capillary: 140 mg/dL — ABNORMAL HIGH (ref 70–99)
Glucose-Capillary: 189 mg/dL — ABNORMAL HIGH (ref 70–99)
Glucose-Capillary: 324 mg/dL — ABNORMAL HIGH (ref 70–99)

## 2021-12-10 SURGERY — APPLICATION, GRAFT, SKIN, SPLIT-THICKNESS
Anesthesia: General | Site: Leg Lower | Laterality: Left

## 2021-12-10 MED ORDER — ACETAMINOPHEN 500 MG PO TABS
1000.0000 mg | ORAL_TABLET | Freq: Once | ORAL | Status: AC
  Administered 2021-12-10: 1000 mg via ORAL
  Filled 2021-12-10: qty 2

## 2021-12-10 MED ORDER — METHOCARBAMOL 1000 MG/10ML IJ SOLN
500.0000 mg | Freq: Four times a day (QID) | INTRAVENOUS | Status: DC | PRN
  Filled 2021-12-10: qty 5

## 2021-12-10 MED ORDER — OXYCODONE HCL 5 MG PO TABS
5.0000 mg | ORAL_TABLET | Freq: Once | ORAL | Status: AC | PRN
  Administered 2021-12-10: 5 mg via ORAL

## 2021-12-10 MED ORDER — INSULIN ASPART 100 UNIT/ML IJ SOLN
2.0000 [IU] | Freq: Three times a day (TID) | INTRAMUSCULAR | Status: DC
  Administered 2021-12-10 – 2021-12-12 (×6): 2 [IU] via SUBCUTANEOUS

## 2021-12-10 MED ORDER — DEXAMETHASONE SODIUM PHOSPHATE 10 MG/ML IJ SOLN
INTRAMUSCULAR | Status: DC | PRN
Start: 2021-12-10 — End: 2021-12-10
  Administered 2021-12-10: 5 mg via INTRAVENOUS

## 2021-12-10 MED ORDER — ONDANSETRON HCL 4 MG PO TABS: 4.0000 mg | ORAL_TABLET | Freq: Four times a day (QID) | ORAL | Status: DC | PRN

## 2021-12-10 MED ORDER — PANTOPRAZOLE SODIUM 40 MG PO TBEC
40.0000 mg | DELAYED_RELEASE_TABLET | Freq: Two times a day (BID) | ORAL | Status: DC
  Administered 2021-12-10 – 2021-12-12 (×4): 40 mg via ORAL
  Filled 2021-12-10 (×4): qty 1

## 2021-12-10 MED ORDER — PHENYLEPHRINE HCL-NACL 20-0.9 MG/250ML-% IV SOLN
INTRAVENOUS | Status: DC | PRN
  Administered 2021-12-10: 40 ug/min via INTRAVENOUS

## 2021-12-10 MED ORDER — CEFAZOLIN SODIUM-DEXTROSE 2-4 GM/100ML-% IV SOLN
2.0000 g | INTRAVENOUS | Status: AC
  Administered 2021-12-10: 2 g via INTRAVENOUS
  Filled 2021-12-10: qty 100

## 2021-12-10 MED ORDER — ACETAMINOPHEN 325 MG PO TABS: 325.0000 mg | ORAL_TABLET | Freq: Four times a day (QID) | ORAL | Status: DC | PRN

## 2021-12-10 MED ORDER — FENTANYL CITRATE (PF) 250 MCG/5ML IJ SOLN
INTRAMUSCULAR | Status: AC
  Filled 2021-12-10: qty 5

## 2021-12-10 MED ORDER — OXYCODONE HCL 5 MG PO TABS: 10.0000 mg | ORAL_TABLET | ORAL | Status: DC | PRN

## 2021-12-10 MED ORDER — HYDROMORPHONE HCL 1 MG/ML IJ SOLN: 0.5000 mg | INTRAMUSCULAR | Status: DC | PRN

## 2021-12-10 MED ORDER — METHOCARBAMOL 500 MG PO TABS: 500.0000 mg | ORAL_TABLET | Freq: Four times a day (QID) | ORAL | Status: DC | PRN

## 2021-12-10 MED ORDER — LEVOTHYROXINE SODIUM 50 MCG PO TABS
50.0000 ug | ORAL_TABLET | Freq: Every day | ORAL | Status: DC
  Administered 2021-12-11 – 2021-12-12 (×2): 50 ug via ORAL
  Filled 2021-12-10 (×2): qty 1

## 2021-12-10 MED ORDER — OXYCODONE HCL 5 MG/5ML PO SOLN: 5.0000 mg | Freq: Once | ORAL | Status: AC | PRN

## 2021-12-10 MED ORDER — METOCLOPRAMIDE HCL 5 MG/ML IJ SOLN: 5.0000 mg | Freq: Three times a day (TID) | INTRAMUSCULAR | Status: DC | PRN

## 2021-12-10 MED ORDER — EPHEDRINE SULFATE-NACL 50-0.9 MG/10ML-% IV SOSY
PREFILLED_SYRINGE | INTRAVENOUS | Status: DC | PRN
  Administered 2021-12-10 (×2): 5 mg via INTRAVENOUS

## 2021-12-10 MED ORDER — INSULIN GLARGINE-YFGN 100 UNIT/ML ~~LOC~~ SOLN
5.0000 [IU] | Freq: Every day | SUBCUTANEOUS | Status: DC
  Administered 2021-12-10 – 2021-12-11 (×2): 5 [IU] via SUBCUTANEOUS
  Filled 2021-12-10 (×4): qty 0.05

## 2021-12-10 MED ORDER — CHLORHEXIDINE GLUCONATE 0.12 % MT SOLN
15.0000 mL | Freq: Once | OROMUCOSAL | Status: AC
  Administered 2021-12-10: 15 mL via OROMUCOSAL
  Filled 2021-12-10: qty 15

## 2021-12-10 MED ORDER — SODIUM CHLORIDE 0.9 % IV SOLN: INTRAVENOUS | Status: DC

## 2021-12-10 MED ORDER — MIDAZOLAM HCL 2 MG/2ML IJ SOLN
INTRAMUSCULAR | Status: AC
  Filled 2021-12-10: qty 2

## 2021-12-10 MED ORDER — ONDANSETRON HCL 4 MG/2ML IJ SOLN: 4.0000 mg | Freq: Four times a day (QID) | INTRAMUSCULAR | Status: DC | PRN

## 2021-12-10 MED ORDER — ATORVASTATIN CALCIUM 10 MG PO TABS
10.0000 mg | ORAL_TABLET | Freq: Every day | ORAL | Status: DC
  Administered 2021-12-10 – 2021-12-11 (×2): 10 mg via ORAL
  Filled 2021-12-10 (×2): qty 1

## 2021-12-10 MED ORDER — FENTANYL CITRATE (PF) 100 MCG/2ML IJ SOLN: 25.0000 ug | INTRAMUSCULAR | Status: DC | PRN

## 2021-12-10 MED ORDER — INSULIN ASPART 100 UNIT/ML IJ SOLN
0.0000 [IU] | Freq: Three times a day (TID) | INTRAMUSCULAR | Status: DC
  Administered 2021-12-10: 1 [IU] via SUBCUTANEOUS
  Administered 2021-12-11: 5 [IU] via SUBCUTANEOUS
  Administered 2021-12-11: 4 [IU] via SUBCUTANEOUS
  Administered 2021-12-11: 6 [IU] via SUBCUTANEOUS
  Administered 2021-12-12: 5 [IU] via SUBCUTANEOUS

## 2021-12-10 MED ORDER — ORAL CARE MOUTH RINSE: 15.0000 mL | Freq: Once | OROMUCOSAL | Status: AC

## 2021-12-10 MED ORDER — MIDAZOLAM HCL 2 MG/2ML IJ SOLN
INTRAMUSCULAR | Status: DC | PRN
Start: 2021-12-10 — End: 2021-12-10
  Administered 2021-12-10: 2 mg via INTRAVENOUS

## 2021-12-10 MED ORDER — POLYETHYLENE GLYCOL 3350 17 G PO PACK: 17.0000 g | PACK | Freq: Every day | ORAL | Status: DC | PRN

## 2021-12-10 MED ORDER — TRANEXAMIC ACID-NACL 1000-0.7 MG/100ML-% IV SOLN
1000.0000 mg | INTRAVENOUS | Status: AC
  Administered 2021-12-10: 1000 mg via INTRAVENOUS
  Filled 2021-12-10: qty 100

## 2021-12-10 MED ORDER — LIDOCAINE 2% (20 MG/ML) 5 ML SYRINGE
INTRAMUSCULAR | Status: DC | PRN
Start: 2021-12-10 — End: 2021-12-10
  Administered 2021-12-10: 60 mg via INTRAVENOUS

## 2021-12-10 MED ORDER — 0.9 % SODIUM CHLORIDE (POUR BTL) OPTIME
TOPICAL | Status: DC | PRN
Start: 2021-12-10 — End: 2021-12-10
  Administered 2021-12-10: 1000 mL

## 2021-12-10 MED ORDER — OXYCODONE HCL 5 MG PO TABS
5.0000 mg | ORAL_TABLET | ORAL | Status: DC | PRN
  Filled 2021-12-10: qty 2

## 2021-12-10 MED ORDER — ONDANSETRON HCL 4 MG/2ML IJ SOLN
INTRAMUSCULAR | Status: DC | PRN
  Administered 2021-12-10: 4 mg via INTRAVENOUS

## 2021-12-10 MED ORDER — LACTULOSE 10 GM/15ML PO SOLN
10.0000 g | Freq: Every day | ORAL | Status: DC
  Administered 2021-12-10: 10 g via ORAL
  Filled 2021-12-10 (×2): qty 15

## 2021-12-10 MED ORDER — DOCUSATE SODIUM 100 MG PO CAPS
100.0000 mg | ORAL_CAPSULE | Freq: Two times a day (BID) | ORAL | Status: DC
  Administered 2021-12-10 – 2021-12-11 (×2): 100 mg via ORAL
  Filled 2021-12-10 (×2): qty 1

## 2021-12-10 MED ORDER — FENTANYL CITRATE (PF) 250 MCG/5ML IJ SOLN
INTRAMUSCULAR | Status: DC | PRN
  Administered 2021-12-10: 50 ug via INTRAVENOUS

## 2021-12-10 MED ORDER — CHLORHEXIDINE GLUCONATE 0.12 % MT SOLN
OROMUCOSAL | Status: AC
  Administered 2021-12-10: 15 mL via OROMUCOSAL
  Filled 2021-12-10: qty 15

## 2021-12-10 MED ORDER — PROPOFOL 10 MG/ML IV BOLUS
INTRAVENOUS | Status: DC | PRN
  Administered 2021-12-10: 100 mg via INTRAVENOUS

## 2021-12-10 MED ORDER — PHENYLEPHRINE 40 MCG/ML (10ML) SYRINGE FOR IV PUSH (FOR BLOOD PRESSURE SUPPORT)
PREFILLED_SYRINGE | INTRAVENOUS | Status: DC | PRN
  Administered 2021-12-10 (×2): 120 ug via INTRAVENOUS
  Administered 2021-12-10: 80 ug via INTRAVENOUS
  Administered 2021-12-10 (×2): 120 ug via INTRAVENOUS

## 2021-12-10 MED ORDER — CHLORHEXIDINE GLUCONATE 0.12 % MT SOLN: 15.0000 mL | Freq: Once | OROMUCOSAL | Status: AC

## 2021-12-10 MED ORDER — SEVELAMER CARBONATE 800 MG PO TABS
1600.0000 mg | ORAL_TABLET | Freq: Three times a day (TID) | ORAL | Status: DC
  Administered 2021-12-10 – 2021-12-12 (×3): 1600 mg via ORAL
  Filled 2021-12-10 (×4): qty 2

## 2021-12-10 MED ORDER — OXYCODONE HCL 5 MG PO TABS
ORAL_TABLET | ORAL | Status: AC
  Filled 2021-12-10: qty 1

## 2021-12-10 MED ORDER — TORSEMIDE 20 MG PO TABS
150.0000 mg | ORAL_TABLET | Freq: Every day | ORAL | Status: DC
  Administered 2021-12-10 – 2021-12-12 (×3): 150 mg via ORAL
  Filled 2021-12-10 (×3): qty 8

## 2021-12-10 MED ORDER — LACTATED RINGERS IV SOLN: INTRAVENOUS | Status: DC

## 2021-12-10 MED ORDER — ONDANSETRON HCL 4 MG/2ML IJ SOLN: 4.0000 mg | Freq: Once | INTRAMUSCULAR | Status: DC | PRN

## 2021-12-10 MED ORDER — PROPOFOL 10 MG/ML IV BOLUS
INTRAVENOUS | Status: AC
  Filled 2021-12-10: qty 20

## 2021-12-10 MED ORDER — BISACODYL 10 MG RE SUPP: 10.0000 mg | Freq: Every day | RECTAL | Status: DC | PRN

## 2021-12-10 MED ORDER — METOCLOPRAMIDE HCL 5 MG PO TABS: 5.0000 mg | ORAL_TABLET | Freq: Three times a day (TID) | ORAL | Status: DC | PRN

## 2021-12-10 SURGICAL SUPPLY — 53 items
BAG COUNTER SPONGE SURGICOUNT (BAG) ×1 IMPLANT
BAG SPNG CNTER NS LX DISP (BAG)
BNDG CMPR 9X4 STRL LF SNTH (GAUZE/BANDAGES/DRESSINGS)
BNDG COHESIVE 6X5 TAN NS LF (GAUZE/BANDAGES/DRESSINGS) ×1 IMPLANT
BNDG COHESIVE 6X5 TAN STRL LF (GAUZE/BANDAGES/DRESSINGS) IMPLANT
BNDG ESMARK 4X9 LF (GAUZE/BANDAGES/DRESSINGS) ×1 IMPLANT
BNDG GAUZE ELAST 4 BULKY (GAUZE/BANDAGES/DRESSINGS) IMPLANT
CANISTER WOUND CARE 500ML ATS (WOUND CARE) ×1 IMPLANT
COVER SURGICAL LIGHT HANDLE (MISCELLANEOUS) ×3 IMPLANT
CUFF TOURN SGL QUICK 18X4 (TOURNIQUET CUFF) IMPLANT
CUFF TOURN SGL QUICK 24 (TOURNIQUET CUFF)
CUFF TRNQT CYL 24X4X16.5-23 (TOURNIQUET CUFF) IMPLANT
DERMACARRIERS GRAFT 1 TO 1.5 (DISPOSABLE)
DRAPE DERMATAC (DRAPES) ×3 IMPLANT
DRAPE U-SHAPE 47X51 STRL (DRAPES) ×2 IMPLANT
DRESSING VERAFLO CLEANS CC MED (GAUZE/BANDAGES/DRESSINGS) IMPLANT
DRSG ADAPTIC 3X8 NADH LF (GAUZE/BANDAGES/DRESSINGS) IMPLANT
DRSG MEPITEL 4X7.2 (GAUZE/BANDAGES/DRESSINGS) ×1 IMPLANT
DRSG VERAFLO CLEANSE CC MED (GAUZE/BANDAGES/DRESSINGS) ×2
DURAPREP 26ML APPLICATOR (WOUND CARE) ×3 IMPLANT
ELECT REM PT RETURN 9FT ADLT (ELECTROSURGICAL) ×2
ELECTRODE REM PT RTRN 9FT ADLT (ELECTROSURGICAL) ×1 IMPLANT
GAUZE SPONGE 4X4 12PLY STRL (GAUZE/BANDAGES/DRESSINGS) IMPLANT
GLOVE SURG ORTHO LTX SZ9 (GLOVE) ×2 IMPLANT
GLOVE SURG UNDER POLY LF SZ9 (GLOVE) ×2 IMPLANT
GOWN STRL REUS W/ TWL XL LVL3 (GOWN DISPOSABLE) ×2 IMPLANT
GOWN STRL REUS W/TWL XL LVL3 (GOWN DISPOSABLE) ×4
GRAFT DERMACARRIERS 1 TO 1.5 (DISPOSABLE) IMPLANT
GRAFT SKIN WND OMEGA3 7X10 (Tissue) ×2 IMPLANT
GRAFT SKN 7X10XSTRL LF DISP (Tissue) IMPLANT
KIT BASIN OR (CUSTOM PROCEDURE TRAY) ×2 IMPLANT
KIT DRSG PREVENA PLUS 7DAY 125 (MISCELLANEOUS) ×1 IMPLANT
KIT TURNOVER KIT B (KITS) ×2 IMPLANT
MANIFOLD NEPTUNE II (INSTRUMENTS) ×2 IMPLANT
MICROMATRIX 500MG (Tissue) ×2 IMPLANT
NDL HYPO 25GX1X1/2 BEV (NEEDLE) IMPLANT
NEEDLE HYPO 25GX1X1/2 BEV (NEEDLE) IMPLANT
NS IRRIG 1000ML POUR BTL (IV SOLUTION) ×2 IMPLANT
PACK ORTHO EXTREMITY (CUSTOM PROCEDURE TRAY) ×2 IMPLANT
PAD ARMBOARD 7.5X6 YLW CONV (MISCELLANEOUS) ×3 IMPLANT
PAD CAST 4YDX4 CTTN HI CHSV (CAST SUPPLIES) IMPLANT
PAD NEG PRESSURE SENSATRAC (MISCELLANEOUS) ×1 IMPLANT
PADDING CAST COTTON 4X4 STRL (CAST SUPPLIES)
SOLUTION PARTIC MCRMTRX 500MG (Tissue) IMPLANT
STAPLER VISISTAT 35W (STAPLE) ×1 IMPLANT
SUCTION FRAZIER HANDLE 10FR (MISCELLANEOUS)
SUCTION TUBE FRAZIER 10FR DISP (MISCELLANEOUS) IMPLANT
SUT ETHILON 4 0 PS 2 18 (SUTURE) IMPLANT
SYR CONTROL 10ML LL (SYRINGE) IMPLANT
TOWEL GREEN STERILE (TOWEL DISPOSABLE) ×2 IMPLANT
TOWEL GREEN STERILE FF (TOWEL DISPOSABLE) ×2 IMPLANT
TUBE CONNECTING 12X1/4 (SUCTIONS) ×1 IMPLANT
WATER STERILE IRR 1000ML POUR (IV SOLUTION) ×1 IMPLANT

## 2021-12-10 NOTE — Anesthesia Preprocedure Evaluation (Addendum)
Anesthesia Evaluation  Patient identified by MRN, date of birth, ID band Patient awake    Reviewed: Allergy & Precautions, NPO status , Patient's Chart, lab work & pertinent test results  History of Anesthesia Complications Negative for: history of anesthetic complications  Airway Mallampati: II  TM Distance: >3 FB Neck ROM: Full    Dental  (+) Dental Advisory Given   Pulmonary neg pulmonary ROS,    Pulmonary exam normal        Cardiovascular hypertension, Normal cardiovascular exam+ Valvular Problems/Murmurs    '22 TTE - EF 55 to 60%. Grade II diastolic dysfunction (pseudonormalization). Left atrial size was mildly dilated. A small pericardial effusion is present. Tricuspid valve regurgitation is mild to moderate.     Neuro/Psych negative neurological ROS  negative psych ROS   GI/Hepatic Neg liver ROS, GERD  Medicated,  Endo/Other  diabetes, Insulin DependentHypothyroidism  Obesity   Renal/GU ESRF and DialysisRenal disease     Musculoskeletal negative musculoskeletal ROS (+)   Abdominal   Peds  Hematology  (+) anemia ,   Anesthesia Other Findings   Reproductive/Obstetrics                           Anesthesia Physical Anesthesia Plan  ASA: 3  Anesthesia Plan: General   Post-op Pain Management: Tylenol PO (pre-op)   Induction: Intravenous  PONV Risk Score and Plan: 3 and Treatment may vary due to age or medical condition, Ondansetron, Dexamethasone and Midazolam  Airway Management Planned: LMA  Additional Equipment: None  Intra-op Plan:   Post-operative Plan: Extubation in OR  Informed Consent: I have reviewed the patients History and Physical, chart, labs and discussed the procedure including the risks, benefits and alternatives for the proposed anesthesia with the patient or authorized representative who has indicated his/her understanding and acceptance.     Dental  advisory given  Plan Discussed with: CRNA and Anesthesiologist  Anesthesia Plan Comments:        Anesthesia Quick Evaluation

## 2021-12-10 NOTE — Transfer of Care (Signed)
Immediate Anesthesia Transfer of Care Note  Patient: [name], clinical  Procedure(s) Performed: IRRIGATION AND DEBRIDEMENT LEFT CALF, APPLICATION SPLIT THICKNESS SKIN GRAFT (Left: Leg Lower)  Patient Location: PACU  Anesthesia Type:General  Level of Consciousness: awake and drowsy  Airway & Oxygen Therapy: Patient Spontanous Breathing  Post-op Assessment: Report given to RN and Post -op Vital signs reviewed and stable  Post vital signs: Reviewed and stable  Last Vitals:  Vitals Value Taken Time  BP 139/88 12/10/21 1240  Temp    Pulse 88 12/10/21 1242  Resp 15 12/10/21 1242  SpO2 92 % 12/10/21 1242  Vitals shown include unvalidated device data.  Last Pain:  Vitals:   12/10/21 0958  TempSrc:   PainSc: 0-No pain         Complications: No notable events documented.

## 2021-12-10 NOTE — Progress Notes (Signed)
Patient had positive home Covid test 11/11/21 and positive at PCP on same day. Did not test for Covid DOS.

## 2021-12-10 NOTE — Consult Note (Signed)
[facility]  INPATIENT CONSULTATION  Reason for Consultation: ESRD Requesting Provider: Dr. [name]  HPI: Amy Moses is an 51 y.o. female ESRD, HTN, T2DM, hypothyroidism, and BLE calciphylaxis who was admitted for elective skin grafting to L leg.    She is seen on hospital floor after OR earlier today.  Went well.  Wound vac being set up.  No issues currently.  HD has been going well via LUE AVF.  No recent issues with treatments.   PMH: Past Medical History:  Diagnosis Date   Anemia    Blindness of right eye with low vision in contralateral eye    s/p victrectomy   Diabetes mellitus, type II ([place])    Dyslipidemia    Glaucoma    History of blood transfusion    Hypertension    Hypothyroidism (acquired)    Kidney disease    Stage 5   Pneumonia    PSH: Past Surgical History:  Procedure Laterality Date   ABDOMINAL AORTOGRAM W/LOWER EXTREMITY Bilateral 12/18/2020   Procedure: ABDOMINAL AORTOGRAM W/LOWER EXTREMITY;  Surgeon: Elam Dutch, MD;  Location: [facility];  Service: Cardiovascular;  Laterality: Bilateral;   ANKLE FRACTURE SURGERY Right    AV FISTULA PLACEMENT Left 08/18/2020   Procedure: LEFT ARM BRACHIOCEPHALIC ARTERIOVENOUS (AV) FISTULA CREATION;  Surgeon: Elam Dutch, MD;  Location: [facility];  Service: Vascular;  Laterality: Left;   BIOPSY  04/24/2021   Procedure: BIOPSY;  Surgeon: Eloise Harman, DO;  [facility];  Service: Endoscopy;;   CESAREAN SECTION     CHOLECYSTECTOMY     COLONOSCOPY  04/24/2021   Surgeon: Eloise Harman, DO;  nonbleeding internal hemorrhoids, 1 large (25 mm) pedunculated transverse colon polyp (prolapse type polyp) with adherent clot and stigmata of recent bleed.   COLONOSCOPY WITH PROPOFOL N/A 05/14/2021   Procedure: COLONOSCOPY WITH PROPOFOL;  Surgeon: Daneil Dolin, MD;  [facility];  Service: Endoscopy;  Laterality: N/A;   ESOPHAGOGASTRODUODENOSCOPY (EGD) WITH PROPOFOL N/A  04/24/2021   Surgeon: Eloise Harman, DO;  duodenal erosions and gastritis biopsied (pathology with peptic duodenitis, reactive gastropathy with erosions/chronic inflammation, negative for H. pylori)   EYE SURGERY     Vatrectomy   HEMOSTASIS CLIP PLACEMENT  05/14/2021   Procedure: HEMOSTASIS CLIP PLACEMENT;  Surgeon: Daneil Dolin, MD;  [facility];  Service: Endoscopy;;   IR PERC TUN PERIT CATH WO PORT S&I /IMAG  09/15/2020   IR REMOVAL TUN CV CATH W/O FL  02/19/2021   IR US GUIDE VASC ACCESS RIGHT  09/15/2020   POLYPECTOMY  04/24/2021   Procedure: POLYPECTOMY;  Surgeon: Eloise Harman, DO;  [facility];  Service: Endoscopy;;   POLYPECTOMY  05/14/2021   Procedure: POLYPECTOMY;  Surgeon: Daneil Dolin, MD;  [facility];  Service: Endoscopy;;   SKIN SPLIT GRAFT Bilateral 09/03/2021   Procedure: SKIN GRAFT BILATERAL LEGS;  Surgeon: Newt Minion, MD;  Location: [facility];  Service: Orthopedics;  Laterality: Bilateral;   TOE SURGERY      Past Medical History:  Diagnosis Date   Anemia    Blindness of right eye with low vision in contralateral eye    s/p victrectomy   Diabetes mellitus, type II ([place])    Dyslipidemia    Glaucoma    History of blood transfusion    Hypertension    Hypothyroidism (acquired)    Kidney disease    Stage 5   Pneumonia  Medications:  I have reviewed the patient's current medications.   Medications Prior to Admission  Medication Sig Dispense Refill   acetaminophen (TYLENOL) 500 MG tablet Take 1,000 mg by mouth every 6 (six) hours as needed for moderate pain.     aspirin EC 81 MG tablet Take 1 tablet (81 mg total) by mouth daily with breakfast. 30 tablet 2   atorvastatin (LIPITOR) 10 MG tablet Take 1 tablet (10 mg total) by mouth daily. (Patient taking differently: Take 10 mg by mouth at bedtime.) 90 tablet 5   HUMALOG KWIKPEN 100 UNIT/ML KwikPen Inject 2 Units into the skin 3 (three) times daily with meals. If  eats 50% or more of meal. (Patient taking differently: Inject 5-15 Units into the skin 3 (three) times daily with meals. Sliding Scale If eats 50% or more of meal.) 15 mL 11   insulin glargine (LANTUS) 100 UNIT/ML injection Inject 0.08 mLs (8 Units total) into the skin at bedtime. 10 mL 11   lactulose (CHRONULAC) 10 GM/15ML solution Take 10 g by mouth daily as needed for moderate constipation.     levothyroxine (SYNTHROID) 50 MCG tablet Take 1 tablet (50 mcg total) by mouth daily before breakfast. 30 tablet 5   pantoprazole (PROTONIX) 40 MG tablet Take 1 tablet (40 mg total) by mouth 2 (two) times daily. 60 tablet 5   sevelamer carbonate (RENVELA) 800 MG tablet Take 1 tablet (800 mg total) by mouth 3 (three) times daily with meals. (Patient taking differently: Take 1,600 mg by mouth 3 (three) times daily with meals.) 90 tablet 3   torsemide (DEMADEX) 100 MG tablet Take 150 mg by mouth daily.      Vitamin D, Ergocalciferol, (DRISDOL) 1.25 MG (50000 UNIT) CAPS capsule Take 50,000 Units by mouth every 14 (fourteen) days.     oxyCODONE-acetaminophen (PERCOCET/ROXICET) 5-325 MG tablet Take 1 tablet by mouth every 4 (four) hours as needed. (Patient not taking: Reported on 12/09/2021) 30 tablet 0    ALLERGIES:   Allergies  Allergen Reactions   Ace Inhibitors Cough    FAM HX: Family History  Problem Relation Age of Onset   Heart disease Mother    Diabetes Mother    Kidney disease Mother    Diabetes Father    Heart disease Father    Diabetes Brother    Colon cancer Neg Hx     Social History:   reports that she has never smoked. She has never used smokeless tobacco. She reports that she does not drink alcohol and does not use drugs.  ROS: 12 system ROS neg except per HPI  Blood pressure 135/83, pulse 80, temperature 98.2 F (36.8 C), resp. rate 16, height 5\' 6"  (1.676 m), weight 90.7 kg, last menstrual period 08/22/2015, SpO2 95 %. PHYSICAL EXAM: Gen: nontoxic in bed  Eyes: L eye  EOMI ENT: MMM CV:  RRR Abd: soft, nontender Lungs: clear GU: no foley Extr: LLE with wound vac, dressing BL in place, extensive calciphylaxis wounds on BL legs, LUE AVF +t/b Neuro: conversant    Results for orders placed or performed during the hospital encounter of 12/10/21 (from the past 48 hour(s))  Glucose, capillary     Status: Abnormal   Collection Time: 12/10/21  9:29 AM  Result Value Ref Range   Glucose-Capillary 168 (H) 70 - 99 mg/dL    Comment: Glucose reference range applies only to samples taken after fasting for at least 8 hours.  Surgical pcr screen     Status: None  Collection Time: 12/10/21  9:48 AM   Specimen: Nasal Mucosa; Nasal Swab  Result Value Ref Range   MRSA, PCR NEGATIVE NEGATIVE   Staphylococcus aureus NEGATIVE NEGATIVE    Comment: (NOTE) The Xpert SA Assay (FDA approved for NASAL specimens in patients 55 years of age and older), is one component of a comprehensive surveillance program. It is not intended to diagnose infection nor to guide or monitor treatment. Performed at [facility], [place] [address], [place], [place] [address]   Basic metabolic panel per protocol     Status: Abnormal   Collection Time: 12/10/21 10:22 AM  Result Value Ref Range   Sodium 137 135 - 145 mmol/L   Potassium 4.0 3.5 - 5.1 mmol/L   Chloride 100 98 - 111 mmol/L   CO2 26 22 - 32 mmol/L   Glucose, Bld 144 (H) 70 - 99 mg/dL    Comment: Glucose reference range applies only to samples taken after fasting for at least 8 hours.   BUN 26 (H) 6 - 20 mg/dL   Creatinine, Ser 3.80 (H) 0.44 - 1.00 mg/dL   Calcium 9.4 8.9 - 10.3 mg/dL   GFR, Estimated 14 (L) >60 mL/min    Comment: (NOTE) Calculated using the CKD-EPI Creatinine Equation (2021)    Anion gap 11 5 - 15    Comment: Performed at [facility] [address], [place], [place] [address]  CBC per protocol     Status: None   Collection Time: 12/10/21 10:22 AM  Result Value Ref Range   WBC 6.5 4.0 - 10.5  K/uL   RBC 4.60 3.87 - 5.11 MIL/uL   Hemoglobin 12.6 12.0 - 15.0 g/dL   HCT 41.1 36.0 - 46.0 %   MCV 89.3 80.0 - 100.0 fL   MCH 27.4 26.0 - 34.0 pg   MCHC 30.7 30.0 - 36.0 g/dL   RDW 14.9 11.5 - 15.5 %   Platelets 180 150 - 400 K/uL   nRBC 0.0 0.0 - 0.2 %    Comment: Performed at [facility], [place] [address] [place], [place] [address]  Glucose, capillary     Status: Abnormal   Collection Time: 12/10/21 11:35 AM  Result Value Ref Range   Glucose-Capillary 126 (H) 70 - 99 mg/dL    Comment: Glucose reference range applies only to samples taken after fasting for at least 8 hours.  Glucose, capillary     Status: Abnormal   Collection Time: 12/10/21 12:43 PM  Result Value Ref Range   Glucose-Capillary 140 (H) 70 - 99 mg/dL    Comment: Glucose reference range applies only to samples taken after fasting for at least 8 hours.  Glucose, capillary     Status: Abnormal   Collection Time: 12/10/21  3:50 PM  Result Value Ref Range   Glucose-Capillary 189 (H) 70 - 99 mg/dL    Comment: Glucose reference range applies only to samples taken after fasting for at least 8 hours.    No results found.  Assessment/Plan **s/p skin grafting:  ongoing care for calciphylaxis wounds.  Admitted post op.  Has wound vac that will need to be arranged.  Per Dr. [name].  **ESRD on HD: [place].  Labs and volume ok today; HD tomorrow on usual schedule.   **BMM:  Cont home binders.  Minimize ca load as possible.   **Anemia:  Hb 12.6, no esa needed.  **Nutrition: supplements per RD.  **DM  Will follow, call with  concerns.   Justin Mend 12/10/2021, 4:51 PM

## 2021-12-10 NOTE — H&P (Signed)
Charolette Bultman is an 51 y.o. female.   Chief Complaint: Persistent wound left leg. HPI: Has a history of extensive calciphylaxis of both lower extremities.  She underwent debridement and skin grafting of both legs.  She has completely healed the right leg and still has persistent open wound on the left leg.  Despite prolonged conservative dressing changes the wound has not healed and patient presents at this time for repeat skin graft.  Past Medical History:  Diagnosis Date   Anemia    Blindness of right eye with low vision in contralateral eye    s/p victrectomy   Diabetes mellitus, type II ([place])    Dyslipidemia    Glaucoma    History of blood transfusion    Hypertension    Hypothyroidism (acquired)    Kidney disease    Stage 5   Pneumonia     Past Surgical History:  Procedure Laterality Date   ABDOMINAL AORTOGRAM W/LOWER EXTREMITY Bilateral 12/18/2020   Procedure: ABDOMINAL AORTOGRAM W/LOWER EXTREMITY;  Surgeon: Elam Dutch, MD;  Location: [facility];  Service: Cardiovascular;  Laterality: Bilateral;   ANKLE FRACTURE SURGERY Right    AV FISTULA PLACEMENT Left 08/18/2020   Procedure: LEFT ARM BRACHIOCEPHALIC ARTERIOVENOUS (AV) FISTULA CREATION;  Surgeon: Elam Dutch, MD;  Location: [facility];  Service: Vascular;  Laterality: Left;   BIOPSY  04/24/2021   Procedure: BIOPSY;  Surgeon: Eloise Harman, DO;  [facility];  Service: Endoscopy;;   CESAREAN SECTION     CHOLECYSTECTOMY     COLONOSCOPY  04/24/2021   Surgeon: Eloise Harman, DO;  nonbleeding internal hemorrhoids, 1 large (25 mm) pedunculated transverse colon polyp (prolapse type polyp) with adherent clot and stigmata of recent bleed.   COLONOSCOPY WITH PROPOFOL N/A 05/14/2021   Procedure: COLONOSCOPY WITH PROPOFOL;  Surgeon: Daneil Dolin, MD;  [facility];  Service: Endoscopy;  Laterality: N/A;   ESOPHAGOGASTRODUODENOSCOPY (EGD) WITH PROPOFOL N/A 04/24/2021   Surgeon: Eloise Harman, DO;  duodenal erosions and gastritis biopsied (pathology with peptic duodenitis, reactive gastropathy with erosions/chronic inflammation, negative for H. pylori)   EYE SURGERY     Vatrectomy   HEMOSTASIS CLIP PLACEMENT  05/14/2021   Procedure: HEMOSTASIS CLIP PLACEMENT;  Surgeon: Daneil Dolin, MD;  [facility];  Service: Endoscopy;;   IR PERC TUN PERIT CATH WO PORT S&I /IMAG  09/15/2020   IR REMOVAL TUN CV CATH W/O FL  02/19/2021   IR US GUIDE VASC ACCESS RIGHT  09/15/2020   POLYPECTOMY  04/24/2021   Procedure: POLYPECTOMY;  Surgeon: Eloise Harman, DO;  [facility];  Service: Endoscopy;;   POLYPECTOMY  05/14/2021   Procedure: POLYPECTOMY;  Surgeon: Daneil Dolin, MD;  [facility];  Service: Endoscopy;;   SKIN SPLIT GRAFT Bilateral 09/03/2021   Procedure: SKIN GRAFT BILATERAL LEGS;  Surgeon: Newt Minion, MD;  Location: [place];  Service: Orthopedics;  Laterality: Bilateral;   TOE SURGERY      Family History  Problem Relation Age of Onset   Heart disease Mother    Diabetes Mother    Kidney disease Mother    Diabetes Father    Heart disease Father    Diabetes Brother    Colon cancer Neg Hx    Social History:  reports that she has never smoked. She has never used smokeless tobacco. She reports that she does not drink alcohol and does not use drugs.  Allergies:  Allergies  Allergen Reactions   Ace Inhibitors Cough    No medications prior to admission.    No results found for this or any previous visit (from the past 48 hour(s)). No results found.  Review of Systems  All other systems reviewed and are negative.  Height 5\' 6"  (1.676 m), weight 90.7 kg, last menstrual period 08/22/2015. Physical Exam  Patient is alert, oriented, no adenopathy, well-dressed, normal affect, normal respiratory effort. Examination the right lower extremity is essentially healed there is excellent epithelization.  Left lower extremity still  shows a large persistent wound with a good healthy wound bed.  This should improve significantly with an additional skin graft treatment. Assessment/Plan Assessment: Calciphylaxis wound left leg.  We will plan for debridement application of Kerecis skin graft.  Patient will need admission for dialysis postoperatively.  Risk and benefits were discussed including risk of the wound not healing need for additional surgery.  Patient states she understands wished to proceed at this time.  Newt Minion, MD 12/10/2021, 6:46 AM

## 2021-12-10 NOTE — Op Note (Addendum)
12/10/2021  12:55 PM  PATIENT:  Amy Moses    PRE-OPERATIVE DIAGNOSIS:  Calciphylaxis Wound Left Leg  POST-OPERATIVE DIAGNOSIS:  Same  PROCEDURE:  IRRIGATION AND DEBRIDEMENT LEFT CALF, APPLICATION SPLIT THICKNESS SKIN GRAFT Kerecis 7 x 10 cm. Application of cleanse choice wound VAC sponge  SURGEON:  Newt Minion, MD  PHYSICIAN ASSISTANT:None ANESTHESIA:   General  PREOPERATIVE INDICATIONS:  [name] is a  51 y.o. female with a diagnosis of Calciphylaxis Wound Left Leg who failed conservative measures and elected for surgical management.    The risks benefits and alternatives were discussed with the patient preoperatively including but not limited to the risks of infection, bleeding, nerve injury, cardiopulmonary complications, the need for revision surgery, among others, and the patient was willing to proceed.  OPERATIVE IMPLANTS: 500 mg ACell powder.  Kerecis 7 x 10 cm.  Prevena cleanse choice wound VAC sponge x1  @ENCIMAGES @  OPERATIVE FINDINGS: Healthy granulation tissue after debridement.  OPERATIVE PROCEDURE: Patient was brought the operating room and underwent a general anesthetic.  After adequate levels anesthesia were obtained patient's left lower extremity was prepped using DuraPrep draped into a sterile field a timeout was called.  A rondure and knife were used to debride the wound.  This left a wound that was 12 x 4 cm.  There was healthy granulation tissue.  ACell powder was then applied to the Kerecis graft that was cut in half lengthwise this was secured to the wound with staples this was covered with the cleanse choice wound VAC sponge secured with derma tack and Covan.  This had a good suction fit patient was extubated taken the PACU in stable condition.  Debridement type: Excisional Debridement  Side: left  Body Location: calf   Tools used for debridement: scalpel and rongeur  Pre-debridement Wound size (cm):   Length: 10        Width: 3     Depth:  1   Post-debridement Wound size (cm):   Length: 12        Width: 4     Depth: 1   Debridement depth beyond dead/damaged tissue down to healthy viable tissue: yes  Tissue layer involved: skin, subcutaneous tissue, muscle / fascia  Nature of tissue removed: Devitalized Tissue and Non-viable tissue  Irrigation volume: 1 liter     Irrigation fluid type: Normal Saline    DISCHARGE PLANNING:  Antibiotic duration: Preoperative antibiotics  Weightbearing: Weightbearing as tolerated  Pain medication: Opioid pathway  Dressing care/ Wound VAC: Continue wound VAC for 1 week  Ambulatory devices: Walker as needed  Discharge to: Plan for dialysis tomorrow as an inpatient and then discharged to home.  Follow-up: In the office 1 week post operative.

## 2021-12-10 NOTE — Anesthesia Procedure Notes (Signed)
Procedure Name: LMA Insertion Date/Time: 12/10/2021 12:05 PM Performed by: Dorann Lodge, CRNA Pre-anesthesia Checklist: Patient identified, Emergency Drugs available, Suction available and Patient being monitored Patient Re-evaluated:Patient Re-evaluated prior to induction Oxygen Delivery Method: Circle System Utilized Preoxygenation: Pre-oxygenation with 100% oxygen Induction Type: IV induction Ventilation: Mask ventilation without difficulty LMA: LMA inserted LMA Size: 4.0 Number of attempts: 1 Airway Equipment and Method: Bite block Placement Confirmation: positive ETCO2 Tube secured with: Tape Dental Injury: Teeth and Oropharynx as per pre-operative assessment

## 2021-12-10 NOTE — Anesthesia Postprocedure Evaluation (Signed)
Anesthesia Post Note  Patient: [name], clinical  Procedure(s) Performed: IRRIGATION AND DEBRIDEMENT LEFT CALF, APPLICATION SPLIT THICKNESS SKIN GRAFT (Left: Leg Lower)     Patient location during evaluation: PACU Anesthesia Type: General Level of consciousness: awake and alert Pain management: pain level controlled Vital Signs Assessment: post-procedure vital signs reviewed and stable Respiratory status: spontaneous breathing, nonlabored ventilation and respiratory function stable Cardiovascular status: stable and blood pressure returned to baseline Anesthetic complications: no   No notable events documented.  Last Vitals:  Vitals:   12/10/21 1340 12/10/21 1410  BP: 107/64 138/76  Pulse: 71 76  Resp: 13 16  Temp: 36.8 C   SpO2: 95% 100%    Last Pain:  Vitals:   12/10/21 1410  TempSrc:   PainSc: 0-No pain                 Audry Pili

## 2021-12-10 NOTE — Plan of Care (Signed)

## 2021-12-11 DIAGNOSIS — S81802A Unspecified open wound, left lower leg, initial encounter: Secondary | ICD-10-CM | POA: Diagnosis not present

## 2021-12-11 LAB — HEMOGLOBIN A1C
Hgb A1c MFr Bld: 12.2 % — ABNORMAL HIGH (ref 4.8–5.6)
Mean Plasma Glucose: 303 mg/dL

## 2021-12-11 LAB — GLUCOSE, CAPILLARY
Glucose-Capillary: 430 mg/dL — ABNORMAL HIGH (ref 70–99)
Glucose-Capillary: 366 mg/dL — ABNORMAL HIGH (ref 70–99)
Glucose-Capillary: 334 mg/dL — ABNORMAL HIGH (ref 70–99)
Glucose-Capillary: 388 mg/dL — ABNORMAL HIGH (ref 70–99)

## 2021-12-11 MED ORDER — OXYCODONE-ACETAMINOPHEN 5-325 MG PO TABS: 1.0000 | ORAL_TABLET | ORAL | 0 refills | Status: DC | PRN

## 2021-12-11 NOTE — TOC Transition Note (Signed)
Transition of Care [facility]) - CM/SW Discharge Note   Patient Details  Name: Amy Moses MRN: [id number] Date of Birth: [birth year]  [facility] CM/SW Contact:  Carles Collet, RN Phone Number: 12/11/2021, 1:34 PM   Clinical Narrative:   [name] w patient over the phone to discuss DC plan. She confirms that she will go home, has transportation. She is active w [facility] prior to admission, resumption placed and liaison notified of DC. They will see her tomorrow. Patient states that she had Unna wraps prior to admission and was getting them changed at Dr Jess Barters office and that she spoke w him this morning and will continue to do this after DC. Patient has disposable Proveena VAC. No other TOC needs identified.      Final next level of care: [place] Barriers to Discharge: No Barriers Identified   Patient Goals and CMS Choice Patient states their goals for this hospitalization and ongoing recovery are:: to return home CMS Medicare.gov Compare Post Acute Care list provided to:: Patient Choice offered to / list presented to : Patient  Discharge Placement                       Discharge Plan and Services                          HH Arranged: RN [facility] Agency: [place] Date [facility] Agency Contacted: 12/11/21 Time [place]: [id number] Representative spoke with at [place]: [place] Determinants of Health ([place]) Interventions     Readmission Risk Interventions Readmission Risk Prevention Plan 05/14/2021  Transportation Screening Complete  Medication Review Press photographer) Complete  HRI or [place] Complete  SW Recovery Care/Counseling Consult Complete  Palliative Care Screening Not [place] Not Applicable  Some recent data might be hidden

## 2021-12-11 NOTE — Discharge Summary (Signed)
Discharge Diagnoses:  Principal Problem:   Leg wound, left, sequela Active Problems:   Open wound of left lower leg   Surgeries: Procedure(s): IRRIGATION AND DEBRIDEMENT LEFT CALF, APPLICATION SPLIT THICKNESS SKIN GRAFT on 12/10/2021    Consultants:   Discharged Condition: Improved  Hospital Course: Amy Moses is an 51 y.o. female who was admitted 12/10/2021 with a chief complaint of calciphylaxis wound left leg, with a final diagnosis of Calciphylaxis Wound Left Leg.  Patient was brought to the operating room on 12/10/2021 and underwent Procedure(s): IRRIGATION AND DEBRIDEMENT LEFT CALF, APPLICATION SPLIT THICKNESS SKIN GRAFT.    Patient was given perioperative antibiotics:  Anti-infectives (From admission, onward)    Start     Dose/Rate Route Frequency Ordered Stop   12/10/21 0945  ceFAZolin (ANCEF) IVPB 2g/100 mL premix        2 g 200 mL/hr over 30 Minutes Intravenous On call to O.R. 12/10/21 0940 12/10/21 1207     .  Patient was given sequential compression devices, early ambulation, and aspirin for DVT prophylaxis.  Recent vital signs: Patient Vitals for the past 24 hrs:  BP Temp Temp src Pulse Resp SpO2 Height Weight  12/11/21 0742 127/70 97.8 F (36.6 C) -- 79 17 92 % -- --  12/11/21 0639 132/79 -- -- 80 18 97 % -- --  12/10/21 2053 136/74 98 F (36.7 C) Oral 82 18 95 % -- --  12/10/21 1547 135/83 98.2 F (36.8 C) -- 80 16 95 % -- --  12/10/21 1510 129/75 97.8 F (36.6 C) -- 74 20 100 % -- --  12/10/21 1440 125/72 97.8 F (36.6 C) -- 73 14 96 % -- --  12/10/21 1410 138/76 -- -- 76 16 100 % -- --  12/10/21 1340 107/64 98.2 F (36.8 C) -- 71 13 95 % -- --  12/10/21 1325 113/67 -- -- 73 12 100 % -- --  12/10/21 1310 124/73 -- -- 73 12 100 % -- --  12/10/21 1255 135/81 -- -- 78 13 100 % -- --  12/10/21 1240 139/88 98.3 F (36.8 C) -- 91 11 99 % -- --  12/10/21 0929 122/69 (!) 97.5 F (36.4 C) Oral 74 20 100 % 5\' 6"  (1.676 m) 90.7 kg  .  Recent laboratory  studies: No results found.  Discharge Medications:   Allergies as of 12/11/2021       Reactions   Ace Inhibitors Cough        Medication List     TAKE these medications    acetaminophen 500 MG tablet Commonly known as: TYLENOL Take 1,000 mg by mouth every 6 (six) hours as needed for moderate pain.   aspirin EC 81 MG tablet Take 1 tablet (81 mg total) by mouth daily with breakfast.   atorvastatin 10 MG tablet Commonly known as: LIPITOR Take 1 tablet (10 mg total) by mouth daily. What changed: when to take this   HumaLOG KwikPen 100 UNIT/ML KwikPen Generic drug: insulin lispro Inject 2 Units into the skin 3 (three) times daily with meals. If eats 50% or more of meal. What changed:  how much to take additional instructions   insulin glargine 100 UNIT/ML injection Commonly known as: LANTUS Inject 0.08 mLs (8 Units total) into the skin at bedtime.   lactulose 10 GM/15ML solution Commonly known as: CHRONULAC Take 10 g by mouth daily as needed for moderate constipation.   levothyroxine 50 MCG tablet Commonly known as: SYNTHROID Take 1 tablet (50 mcg total) by  mouth daily before breakfast.   oxyCODONE-acetaminophen 5-325 MG tablet Commonly known as: PERCOCET/ROXICET Take 1 tablet by mouth every 4 (four) hours as needed.   pantoprazole 40 MG tablet Commonly known as: Protonix Take 1 tablet (40 mg total) by mouth 2 (two) times daily.   sevelamer carbonate 800 MG tablet Commonly known as: RENVELA Take 1 tablet (800 mg total) by mouth 3 (three) times daily with meals. What changed: how much to take   torsemide 100 MG tablet Commonly known as: DEMADEX Take 150 mg by mouth daily.   Vitamin D (Ergocalciferol) 1.25 MG (50000 UNIT) Caps capsule Commonly known as: DRISDOL Take 50,000 Units by mouth every 14 (fourteen) days.        Diagnostic Studies: No results found.  Patient benefited maximally from their hospital stay and there were no complications.      Disposition: Discharge disposition: 01-Home or Self Care      Discharge Instructions     Call MD / Call 911   Complete by: As directed    If you experience chest pain or shortness of breath, CALL 911 and be transported to the hospital emergency room.  If you develope a fever above 101 F, pus (white drainage) or increased drainage or redness at the wound, or calf pain, call your surgeon's office.   Constipation Prevention   Complete by: As directed    Drink plenty of fluids.  Prune juice may be helpful.  You may use a stool softener, such as Colace (over the counter) 100 mg twice a day.  Use MiraLax (over the counter) for constipation as needed.   Diet - low sodium heart healthy   Complete by: As directed    Increase activity slowly as tolerated   Complete by: As directed    Negative Pressure Wound Therapy - Incisional   Complete by: As directed    Post-operative opioid taper instructions:   Complete by: As directed    POST-OPERATIVE OPIOID TAPER INSTRUCTIONS: It is important to wean off of your opioid medication as soon as possible. If you do not need pain medication after your surgery it is ok to stop day one. Opioids include: Codeine, Hydrocodone(Norco, Vicodin), Oxycodone(Percocet, oxycontin) and hydromorphone amongst others.  Long term and even short term use of opiods can cause: Increased pain response Dependence Constipation Depression Respiratory depression And more.  Withdrawal symptoms can include Flu like symptoms Nausea, vomiting And more Techniques to manage these symptoms Hydrate well Eat regular healthy meals Stay active Use relaxation techniques(deep breathing, meditating, yoga) Do Not substitute Alcohol to help with tapering If you have been on opioids for less than two weeks and do not have pain than it is ok to stop all together.  Plan to wean off of opioids This plan should start within one week post op of your joint replacement. Maintain the  same interval or time between taking each dose and first decrease the dose.  Cut the total daily intake of opioids by one tablet each day Next start to increase the time between doses. The last dose that should be eliminated is the evening dose.          Follow-up Information     Newt Minion, MD Follow up in 1 week(s).   Specialty: Orthopedic Surgery Contact information: [address] [place] [address] [phone]                  Signed: Newt Minion 12/11/2021, 8:52 AM

## 2021-12-11 NOTE — Progress Notes (Signed)
Patient ID: Amy Moses, female   DOB: Dec 30, 1970, 51 y.o.   MRN: [id number] Patient without complaints this morning postoperative day 1 skin graft left leg.  Patient will discharge with the Praveena plus portable wound VAC pump that is in par stock.  Patient will need the right leg compression wrap changed before discharge with a Unna compression wrap on the right.  Patient may discharge after dialysis.

## 2021-12-11 NOTE — Progress Notes (Signed)
Physical Therapy Discharge Patient Details Name: Amy Moses MRN: [id number] DOB: [birth year] Today's Date: 12/11/2021 Time: 1497-0263 PT Time Calculation (min) (ACUTE ONLY): 14 min  Patient discharged from PT services secondary to goals met and no further PT needs identified.  Please see latest therapy progress note for current level of functioning and progress toward goals.    Progress and discharge plan discussed with patient and/or caregiver: Patient/Caregiver agrees with plan  GP   Amy Osterman A. Samhitha Rosen, PT, DPT Acute Rehabilitation Services Office: [place] 12/11/2021, 9:01 AM

## 2021-12-11 NOTE — Plan of Care (Signed)

## 2021-12-11 NOTE — Plan of Care (Signed)

## 2021-12-11 NOTE — Progress Notes (Addendum)
°  [facility] Progress Note   Subjective:   doing well this AM, no issues; ready for HD  Objective Vitals:   12/10/21 1547 12/10/21 2053 12/11/21 0639 12/11/21 0742  BP: 135/83 136/74 132/79 127/70  Pulse: 80 82 80 79  Resp: 16 18 18 17   Temp: 98.2 F (36.8 C) 98 F (36.7 C)  97.8 F (36.6 C)  TempSrc:  Oral    SpO2: 95% 95% 97% 92%  Weight:      Height:       Physical Exam Gen: nontoxic in bed  Eyes: L eye EOMI ENT: MMM CV:  RRR Abd: soft, nontender Lungs: clear GU: no foley Extr: LLE with wound vac, dressing BL in place, extensive calciphylaxis wounds on BL legs, LUE AVF +t/b Neuro: conversant  Additional Objective Labs: Basic Metabolic Panel: Recent Labs  Lab 12/10/21 1022  NA 137  K 4.0  CL 100  CO2 26  GLUCOSE 144*  BUN 26*  CREATININE 3.80*  CALCIUM 9.4   Liver Function Tests: No results for input(s): AST, ALT, ALKPHOS, BILITOT, PROT, ALBUMIN in the last 168 hours. No results for input(s): LIPASE, AMYLASE in the last 168 hours. CBC: Recent Labs  Lab 12/10/21 1022  WBC 6.5  HGB 12.6  HCT 41.1  MCV 89.3  PLT 180   Blood Culture    Component Value Date/Time   SDES BLOOD RIGHT ARM 08/07/2021 0656   SPECREQUEST  08/07/2021 0656    BOTTLES DRAWN AEROBIC AND ANAEROBIC Blood Culture adequate volume   CULT  08/07/2021 0656    NO GROWTH 5 DAYS Performed at [facility], [place] [address] [place], [place] [address]    REPTSTATUS 08/12/2021 FINAL 08/07/2021 0656    Cardiac Enzymes: No results for input(s): CKTOTAL, CKMB, CKMBINDEX, TROPONINI in the last 168 hours. CBG: Recent Labs  Lab 12/10/21 1135 12/10/21 1243 12/10/21 1550 12/10/21 2051 12/11/21 0741  GLUCAP 126* 140* 189* 324* 430*   Iron Studies: No results for input(s): IRON, TIBC, TRANSFERRIN, FERRITIN in the last 72 hours. @lablastinr3 @ Studies/Results: No results found. Medications:  sodium chloride     methocarbamol (ROBAXIN) IV      atorvastatin  10  mg Oral QHS   docusate sodium  100 mg Oral BID   insulin aspart  0-6 Units Subcutaneous TID WC   insulin aspart  2 Units Subcutaneous TID WC   insulin glargine-yfgn  5 Units Subcutaneous QHS   lactulose  10 g Oral Daily   levothyroxine  50 mcg Oral Q0600   pantoprazole  40 mg Oral BID   sevelamer carbonate  1,600 mg Oral TID WC   torsemide  150 mg Oral Daily    Assessment/Plan **s/p skin grafting:  ongoing care for calciphylaxis wounds.  Admitted post op.  Has wound vac that will need to be arranged.  Per Dr. [name].  No longer on na thiosulfate with HD due to ran >59mo course.  Minimize calcium load.    **ESRD on HD: TTS [place].  Labs and volume ok today; HD today on usual schedule.    **BMM:  Cont home binders.  Minimize ca load as possible.    **Anemia:  Hb 12.6, no esa needed.   **Nutrition: supplements per RD.   **DM: per primary   Will follow, call with concerns.   Jannifer Hick MD 12/11/2021, 7:45 AM  [facility] Pager: [phone]

## 2021-12-11 NOTE — Evaluation (Signed)
Physical Therapy Evaluation Patient Details Name: Amy Moses MRN: [id number] DOB: May 15, 1971 Today's Date: 12/11/2021  History of Present Illness  Pt. is 51 yr old F andmitted on 12/10/21 for planned skin graft. Underwent I + D with L calf skin graft on 1/20.  PMH: anemia, blind R eye, DM, HTN, hypothyroid, CKD, calciphylaxis B LEs with debridement/skin grafting.  Clinical Impression  Pt was previously mod I with w/c transfers prior to L calf skin graft.  S/p skin graft, pt remains mod I with transfers and is not appropriate for continued skilled PT in acute care.  Pt is educated on continuing to practice transfers throughout hospital stay with assist from NSG/NT for wound vac line management.  Pt demos understanding.  Safe to D/C home with assist from family.       Recommendations for follow up therapy are one component of a multi-disciplinary discharge planning process, led by the attending physician.  Recommendations may be updated based on patient status, additional functional criteria and insurance authorization.  Follow Up Recommendations No PT follow up    Assistance Recommended at Discharge PRN  Patient can return home with the following  Assistance with cooking/housework;Assist for transportation;Help with stairs or ramp for entrance    Equipment Recommendations None recommended by PT (Pt. has w/c)  Recommendations for Other Services       Functional Status Assessment Patient has not had a recent decline in their functional status     Precautions / Restrictions Precautions Precautions: Fall Restrictions Weight Bearing Restrictions: Yes LLE Weight Bearing: Weight bearing as tolerated      Mobility  Bed Mobility Overal bed mobility: Modified Independent               Patient Response: Cooperative  Transfers Overall transfer level: Modified independent Equipment used:  (w/c)               General transfer comment: Pt. demos slide transfer over to  w/c with PT assisting only to manage wound vac line.    Ambulation/Gait                  Stairs            Wheelchair Mobility    Modified Rankin (Stroke Patients Only)       Balance Overall balance assessment: No apparent balance deficits (not formally assessed)                                           Pertinent Vitals/Pain Pain Assessment Pain Assessment: 0-10 Pain Score: 0-No pain    Home Living Family/patient expects to be discharged to:: Private residence Living Arrangements: Children;Spouse/significant other (Lives with daughter and boyfriend) Available Help at Discharge: Family;Other (Comment);Available PRN/intermittently (Daughter and SO work but are available PRN) Type of Home: House Home Access: Level entry       Home Layout: One level Home Equipment: Wheelchair - manual Additional Comments: At basline, pt. completes slide transfer to w/c and independently propels w/c around home.  Does not amb due to patella fx she is waiting to get repaired.  States she cannot have it repaired until after her LE wounds heal.    Prior Function Prior Level of Function : Independent/Modified Independent                     Hand Dominance  Extremity/Trunk Assessment   Upper Extremity Assessment Upper Extremity Assessment: Overall WFL for tasks assessed    Lower Extremity Assessment Lower Extremity Assessment: RLE deficits/detail;LLE deficits/detail RLE Deficits / Details: Grossly 2+/5 LLE Deficits / Details: Grossly 2-/5       Communication   Communication: No difficulties  Cognition Arousal/Alertness: Awake/alert Behavior During Therapy: WFL for tasks assessed/performed Overall Cognitive Status: Within Functional Limits for tasks assessed                                 General Comments: Alert and oriented x 3.  Agreeable to demo w/c transfer to PT.        General Comments      Exercises      Assessment/Plan    PT Assessment Patient does not need any further PT services  PT Problem List         PT Treatment Interventions      PT Goals (Current goals can be found in the Care Plan section)  Acute Rehab PT Goals Patient Stated Goal: Pt's goal is return home PT Goal Formulation: With patient Time For Goal Achievement: 12/25/21 Potential to Achieve Goals: Good    Frequency       Co-evaluation               AM-PAC PT "6 Clicks" Mobility  Outcome Measure Help needed turning from your back to your side while in a flat bed without using bedrails?: None Help needed moving from lying on your back to sitting on the side of a flat bed without using bedrails?: A Little Help needed moving to and from a bed to a chair (including a wheelchair)?: A Little Help needed standing up from a chair using your arms (e.g., wheelchair or bedside chair)?: Total Help needed to walk in hospital room?: Total Help needed climbing 3-5 steps with a railing? : Total 6 Click Score: 13    End of Session   Activity Tolerance: Patient tolerated treatment well;No increased pain Patient left: in bed;with call bell/phone within reach Nurse Communication: Mobility status PT Visit Diagnosis: Other abnormalities of gait and mobility (R26.89);Muscle weakness (generalized) (M62.81)    Time: 4193-7902 PT Time Calculation (min) (ACUTE ONLY): 14 min   Charges:   PT Evaluation $PT Eval Low Complexity: 1 Low          Anum Palecek A. Daaron Dimarco, PT, DPT Acute Rehabilitation Services Office: [place] 12/11/2021, 8:58 AM

## 2021-12-12 DIAGNOSIS — S81802A Unspecified open wound, left lower leg, initial encounter: Secondary | ICD-10-CM | POA: Diagnosis not present

## 2021-12-12 LAB — RENAL FUNCTION PANEL
Albumin: 3.2 g/dL — ABNORMAL LOW (ref 3.5–5.0)
Anion gap: 16 — ABNORMAL HIGH (ref 5–15)
BUN: 68 mg/dL — ABNORMAL HIGH (ref 6–20)
CO2: 24 mmol/L (ref 22–32)
Calcium: 8.9 mg/dL (ref 8.9–10.3)
Chloride: 93 mmol/L — ABNORMAL LOW (ref 98–111)
Creatinine, Ser: 6.39 mg/dL — ABNORMAL HIGH (ref 0.44–1.00)
GFR, Estimated: 8 mL/min — ABNORMAL LOW (ref >60)
Glucose, Bld: 323 mg/dL — ABNORMAL HIGH (ref 70–99)
Phosphorus: 8.3 mg/dL — ABNORMAL HIGH (ref 2.5–4.6)
Potassium: 5.7 mmol/L — ABNORMAL HIGH (ref 3.5–5.1)
Sodium: 133 mmol/L — ABNORMAL LOW (ref 135–145)

## 2021-12-12 LAB — HEPATITIS B SURFACE ANTIGEN: Hepatitis B Surface Ag: NONREACTIVE

## 2021-12-12 LAB — CBC
HCT: 38.1 % (ref 36.0–46.0)
Hemoglobin: 11.7 g/dL — ABNORMAL LOW (ref 12.0–15.0)
MCH: 27.2 pg (ref 26.0–34.0)
MCHC: 30.7 g/dL (ref 30.0–36.0)
MCV: 88.6 fL (ref 80.0–100.0)
Platelets: 178 10*3/uL (ref 150–400)
RBC: 4.3 MIL/uL (ref 3.87–5.11)
RDW: 15 % (ref 11.5–15.5)
WBC: 8.8 10*3/uL (ref 4.0–10.5)
nRBC: 0 % (ref 0.0–0.2)

## 2021-12-12 LAB — GLUCOSE, CAPILLARY
Glucose-Capillary: 294 mg/dL — ABNORMAL HIGH (ref 70–99)
Glucose-Capillary: 170 mg/dL — ABNORMAL HIGH (ref 70–99)

## 2021-12-12 LAB — HEPATITIS B CORE ANTIBODY, TOTAL: Hep B Core Total Ab: NONREACTIVE

## 2021-12-12 LAB — HEPATITIS B SURFACE ANTIBODY,QUALITATIVE: Hep B S Ab: NONREACTIVE

## 2021-12-12 MED ORDER — HEPARIN SODIUM (PORCINE) 1000 UNIT/ML DIALYSIS: 1000.0000 [IU] | INTRAMUSCULAR | Status: DC | PRN

## 2021-12-12 MED ORDER — ALTEPLASE 2 MG IJ SOLR: 2.0000 mg | Freq: Once | INTRAMUSCULAR | Status: DC | PRN

## 2021-12-12 MED ORDER — PENTAFLUOROPROP-TETRAFLUOROETH EX AERO: 1.0000 "application " | INHALATION_SPRAY | CUTANEOUS | Status: DC | PRN

## 2021-12-12 MED ORDER — SODIUM CHLORIDE 0.9 % IV SOLN: 100.0000 mL | INTRAVENOUS | Status: DC | PRN

## 2021-12-12 MED ORDER — LIDOCAINE HCL (PF) 1 % IJ SOLN: 5.0000 mL | INTRAMUSCULAR | Status: DC | PRN

## 2021-12-12 MED ORDER — LIDOCAINE-PRILOCAINE 2.5-2.5 % EX CREA: 1.0000 "application " | TOPICAL_CREAM | CUTANEOUS | Status: DC | PRN

## 2021-12-12 MED ORDER — CHLORHEXIDINE GLUCONATE CLOTH 2 % EX PADS
6.0000 | MEDICATED_PAD | Freq: Every day | CUTANEOUS | Status: DC
  Administered 2021-12-12: 6 via TOPICAL

## 2021-12-12 NOTE — Discharge Instructions (Addendum)
Please keep Provena VAC on and in place, charged until followup with Dr. [name]      Discharge Instructions    GENERAL INSTRUCTIONS: 1.  Keep your surgical site elevated above your heart for at least 5-7 days or longer to prevent swelling. This will improve your comfort and your overall recovery following surgery.     2. Please call Dr. Eddie Dibbles office at [phone] with questions Monday-Friday during business hours. If no one answers, please leave a message and someone should get back to the patient within 24 hours. For emergencies please call 911 or proceed to the emergency room.   3. Patient to notify surgical team if experiences any of the following: Bowel/Bladder dysfunction, uncontrolled pain, nerve/muscle weakness, incision with increased drainage or redness, nausea/vomiting and Fever greater than 101.0 F.  Be alert for signs of infection including redness, streaking, odor, fever or chills. Be alert for excessive pain or bleeding and notify your surgeon immediately.  WOUND INSTRUCTIONS:   Leave your dressing in place until your post operative visit.  Keep it clean and dry.  Always keep the incision clean and dry until the staples/sutures are removed. If there is no drainage from the incision you should keep it open to air. If there is drainage from the incision you must keep it covered at all times until the drainage stops  Do not soak in a bath tub, hot tub, pool, lake or other body of water until 21 days after your surgery and your incision is completely dry and healed.  If you have removable sutures (or staples) they must be removed 10-14 days (unless otherwise instructed) from the day of your surgery.     1)  Elevate the extremity as much as possible.  2)  Keep the dressing clean and dry.  3)  Please call us if the dressing becomes wet or dirty.  4)  If you are experiencing worsening pain or worsening swelling, please call.

## 2021-12-12 NOTE — Plan of Care (Signed)

## 2021-12-12 NOTE — Progress Notes (Signed)
Patient ID: Amy Moses, female   DOB: February 20, 1971, 51 y.o.   MRN: [id number] Patient doing well this morning postoperative day 2 skin graft left leg.  Patient will discharge with the Praveena plus portable wound VAC pump at the bedside, nursing to change on DC.  Patient will need the right leg compression wrap changed before discharge with a Unna compression wrap on the right.  Patient may discharge after dialysis.

## 2021-12-12 NOTE — Progress Notes (Signed)
Orthopedic Tech Progress Note Patient Details:  Amy Moses 24-Jan-1971 [id number]  Unna boot applied only to RLE.  Ortho Devices Type of Ortho Device: Haematologist Ortho Device/Splint Location: RLE Ortho Device/Splint Interventions: Ordered, Application, Adjustment, Removal (removal of previous compression wrap)   Post Interventions Patient Tolerated: Well Instructions Provided: Care of device  Maxine Huynh Jeri Modena 12/12/2021, 3:02 PM

## 2021-12-12 NOTE — Progress Notes (Signed)
Nurse called HD unit to find out why they haven't come for the patient for HD yet, but  no one answered the phone, patient is worried since the last time she had HD was this past Thursday. Day shift nurse updated to follow up, will continue to monitor.

## 2021-12-13 ENCOUNTER — Telehealth: Payer: Self-pay | Admitting: Family

## 2021-12-13 ENCOUNTER — Encounter (HOSPITAL_COMMUNITY): Payer: Self-pay | Admitting: Orthopedic Surgery

## 2021-12-13 LAB — HEPATITIS B SURFACE ANTIBODY, QUANTITATIVE: Hep B S AB Quant (Post): <3.1 m[IU]/mL — ABNORMAL LOW (ref >9.9)

## 2021-12-13 NOTE — Telephone Encounter (Signed)
Called pt and advise will leave canister at the front desk for pick up.

## 2021-12-13 NOTE — Telephone Encounter (Signed)
Pt called requesting a call back . Pt states she need a canister refill. Please call pt at [phone].

## 2021-12-17 ENCOUNTER — Other Ambulatory Visit: Payer: Self-pay

## 2021-12-17 ENCOUNTER — Ambulatory Visit (INDEPENDENT_AMBULATORY_CARE_PROVIDER_SITE_OTHER): Payer: Medicare Other | Admitting: Family

## 2021-12-17 ENCOUNTER — Ambulatory Visit: Payer: Medicare Other | Admitting: Family

## 2021-12-17 DIAGNOSIS — L97911 Non-pressure chronic ulcer of unspecified part of right lower leg limited to breakdown of skin: Secondary | ICD-10-CM

## 2021-12-17 DIAGNOSIS — L97923 Non-pressure chronic ulcer of unspecified part of left lower leg with necrosis of muscle: Secondary | ICD-10-CM

## 2021-12-21 ENCOUNTER — Ambulatory Visit (INDEPENDENT_AMBULATORY_CARE_PROVIDER_SITE_OTHER): Payer: Medicare Other | Admitting: Orthopedic Surgery

## 2021-12-21 ENCOUNTER — Other Ambulatory Visit: Payer: Self-pay

## 2021-12-21 DIAGNOSIS — L97923 Non-pressure chronic ulcer of unspecified part of left lower leg with necrosis of muscle: Secondary | ICD-10-CM

## 2021-12-22 ENCOUNTER — Encounter: Payer: Self-pay | Admitting: Orthopedic Surgery

## 2021-12-22 NOTE — Progress Notes (Signed)
Office Visit Note   Patient: Amy Moses           Date of Birth: [birth year]           MRN: [id number] Visit Date: 12/21/2021              Requested by: Emelda Fear, [place] [address] PCP: Emelda Fear, DO  Chief Complaint  Patient presents with   Left Leg - Routine Post Op    12/10/2021 I&D left calf application Kerecis graft       HPI: Patient is a 51 year old woman who presents in follow-up status post repeat debridement left calf placement of Kerecis skin graft and a negative pressure wound therapy dressing.  Assessment & Plan: Visit Diagnoses:  1. Calciphylaxis of left lower extremity with nonhealing ulcer with necrosis of muscle ([place])     Plan: We will continue with the compression wrap the nails were trimmed.  Follow-Up Instructions: Return in about 1 week (around 12/28/2021).   Ortho Exam  Patient is alert, oriented, no adenopathy, well-dressed, normal affect, normal respiratory effort. Examination patient has approximately 80% healthy granulation tissue in the wound bed on the left.  There is no cellulitis no odor no drainage no signs of infection.  A compression wrap was reapplied.  Patient has thickened discolored onychomycotic nails x10 and the nails were trimmed X91 without complications.  Imaging: No results found.   Labs: Lab Results  Component Value Date   HGBA1C 12.2 (H) 12/10/2021   HGBA1C 9.5 (H) 08/07/2021   HGBA1C 7.3 (H) 03/30/2021   ESRSEDRATE 40 (H) 08/07/2021   CRP 11.1 (H) 08/07/2021   REPTSTATUS 08/12/2021 FINAL 08/07/2021   GRAMSTAIN  07/08/2021    RARE WBC PRESENT,BOTH PMN AND MONONUCLEAR RARE GRAM POSITIVE RODS RARE GRAM POSITIVE COCCI IN PAIRS    CULT  08/07/2021    NO GROWTH 5 DAYS Performed at [facility], [place] [address], [place], [place] [address]    LABORGA CITROBACTER KOSERI 05/21/2021   LABORGA PROTEUS MIRABILIS 05/21/2021     Lab Results  Component Value Date   ALBUMIN 3.2 (L)  12/12/2021   ALBUMIN 2.7 (L) 09/04/2021   ALBUMIN 2.4 (L) 08/12/2021   PREALBUMIN 13.7 (L) 08/07/2021    Lab Results  Component Value Date   MG 2.1 05/14/2021   MG 2.8 (H) 05/11/2021   MG 2.3 04/23/2021   No results found for: VD25OH  Lab Results  Component Value Date   PREALBUMIN 13.7 (L) 08/07/2021   CBC EXTENDED Latest Ref Rng & Units 12/12/2021 12/10/2021 09/04/2021  WBC 4.0 - 10.5 K/uL 8.8 6.5 8.9  RBC 3.87 - 5.11 MIL/uL 4.30 4.60 3.49(L)  HGB 12.0 - 15.0 g/dL 11.7(L) 12.6 8.7(L)  HCT 36.0 - 46.0 % 38.1 41.1 29.8(L)  PLT 150 - 400 K/uL 178 180 276  NEUTROABS 1.7 - 7.7 K/uL - - -  LYMPHSABS 0.7 - 4.0 K/uL - - -     There is no height or weight on file to calculate BMI.  Orders:  No orders of the defined types were placed in this encounter.  No orders of the defined types were placed in this encounter.    Procedures: No procedures performed  Clinical Data: No additional findings.  ROS:  All other systems negative, except as noted in the HPI. Review of Systems  Objective: Vital Signs: LMP 08/22/2015 (Approximate)   Specialty Comments:  No specialty comments available.  PMFS History: Patient Active Problem  List   Diagnosis Date Noted   Leg wound, left, sequela 12/10/2021   Open wound of left lower leg    Open leg wound 09/03/2021   Wound infection    Non-pressure chronic ulcer of right calf limited to breakdown of skin ([place])    Calciphylaxis of right lower extremity with nonhealing ulcer, limited to breakdown of skin ([place])    Chronic ulcer of left thigh ([place]) 08/07/2021   Calciphylaxis of left lower extremity with nonhealing ulcer with necrosis of muscle ([place]) 08/07/2021   Lower GI bleed 05/12/2021   Blindness 05/10/2021   Shingles 05/10/2021   Acute GI bleeding 04/24/2021   Acute blood loss anemia 04/23/2021   GI bleed 04/22/2021   Fever 03/31/2021   Acute on chronic heart failure with preserved ejection fraction (HFpEF) ([place]) 03/30/2021    Pressure injury of skin 03/30/2021   End stage renal disease ([place]) 11/16/2020   Calciphylaxis 11/06/2020   Non-healing open wound of heel 11/03/2020   Diabetic foot infection ([place]) 11/01/2020   Decubitus ulcer, heel 11/01/2020   Closed nondisplaced fracture of left patella 37/08/6268   Metabolic acidosis 48/54/6270   Acute on chronic renal failure ([place]) 06/10/2020   Symptomatic anemia 06/10/2020   Acute pericardial effusion 06/10/2020   Other acute nonsuppurative otitis media, unspecified ear 11/29/2019   Chronic kidney disease, stage 4 (severe) ([place]) 03/05/2019   Skin ulcer, limited to breakdown of skin ([place]) 01/28/2019   Vitamin D deficiency 01/28/2019   Uncontrolled type 2 diabetes mellitus with chronic kidney disease, with long-term current use of insulin 09/21/2015   Hyperlipidemia 09/21/2015   Essential hypertension, benign 09/21/2015   Primary hypothyroidism 09/21/2015   Iris bomb 07/31/2012   Secondary angle-closure glaucoma 07/31/2012   Past Medical History:  Diagnosis Date   Anemia    Blindness of right eye with low vision in contralateral eye    s/p victrectomy   Diabetes mellitus, type II ([place])    Dyslipidemia    Glaucoma    History of blood transfusion    Hypertension    Hypothyroidism (acquired)    Kidney disease    Stage 5   Pneumonia     Family History  Problem Relation Age of Onset   Heart disease Mother    Diabetes Mother    Kidney disease Mother    Diabetes Father    Heart disease Father    Diabetes Brother    Colon cancer Neg Hx     Past Surgical History:  Procedure Laterality Date   ABDOMINAL AORTOGRAM W/LOWER EXTREMITY Bilateral 12/18/2020   Procedure: ABDOMINAL AORTOGRAM W/LOWER EXTREMITY;  Surgeon: Elam Dutch, MD;  Location: [facility];  Service: Cardiovascular;  Laterality: Bilateral;   ANKLE FRACTURE SURGERY Right    AV FISTULA PLACEMENT Left 08/18/2020   Procedure: LEFT ARM BRACHIOCEPHALIC ARTERIOVENOUS (AV) FISTULA  CREATION;  Surgeon: Elam Dutch, MD;  Location: [facility];  Service: Vascular;  Laterality: Left;   BIOPSY  04/24/2021   Procedure: BIOPSY;  Surgeon: Eloise Harman, DO;  [facility];  Service: Endoscopy;;   CESAREAN SECTION     CHOLECYSTECTOMY     COLONOSCOPY  04/24/2021   Surgeon: Eloise Harman, DO;  nonbleeding internal hemorrhoids, 1 large (25 mm) pedunculated transverse colon polyp (prolapse type polyp) with adherent clot and stigmata of recent bleed.   COLONOSCOPY WITH PROPOFOL N/A 05/14/2021   Procedure: COLONOSCOPY WITH PROPOFOL;  Surgeon: Daneil Dolin, MD;  [facility];  Service:  Endoscopy;  Laterality: N/A;   ESOPHAGOGASTRODUODENOSCOPY (EGD) WITH PROPOFOL N/A 04/24/2021   Surgeon: Eloise Harman, DO;  duodenal erosions and gastritis biopsied (pathology with peptic duodenitis, reactive gastropathy with erosions/chronic inflammation, negative for H. pylori)   EYE SURGERY     Vatrectomy   HEMOSTASIS CLIP PLACEMENT  05/14/2021   Procedure: HEMOSTASIS CLIP PLACEMENT;  Surgeon: Daneil Dolin, MD;  [facility];  Service: Endoscopy;;   IR PERC TUN PERIT CATH WO PORT S&I /IMAG  09/15/2020   IR REMOVAL TUN CV CATH W/O FL  02/19/2021   IR US GUIDE VASC ACCESS RIGHT  09/15/2020   POLYPECTOMY  04/24/2021   Procedure: POLYPECTOMY;  Surgeon: Eloise Harman, DO;  [facility];  Service: Endoscopy;;   POLYPECTOMY  05/14/2021   Procedure: POLYPECTOMY;  Surgeon: Daneil Dolin, MD;  [facility];  Service: Endoscopy;;   SKIN SPLIT GRAFT Bilateral 09/03/2021   Procedure: SKIN GRAFT BILATERAL LEGS;  Surgeon: Newt Minion, MD;  Location: [facility];  Service: Orthopedics;  Laterality: Bilateral;   SKIN SPLIT GRAFT Left 12/10/2021   Procedure: IRRIGATION AND DEBRIDEMENT LEFT CALF, APPLICATION SPLIT THICKNESS SKIN GRAFT;  Surgeon: Newt Minion, MD;  Location: [facility];  Service: Orthopedics;  Laterality: Left;   TOE SURGERY      Social History   Occupational History   Not on file  Tobacco Use   Smoking status: Never   Smokeless tobacco: Never  Vaping Use   Vaping Use: Never used  Substance and Sexual Activity   Alcohol use: No   Drug use: No   Sexual activity: Yes    Birth control/protection: Condom

## 2021-12-24 ENCOUNTER — Encounter: Payer: Self-pay | Admitting: Family

## 2021-12-24 NOTE — Progress Notes (Signed)
Post-Op Visit Note   Patient: Amy Moses           Date of Birth: 1971-05-22           MRN: [id number] Visit Date: 12/17/2021 PCP: Emelda Fear, DO  Chief Complaint: No chief complaint on file.   HPI:  HPI The patient is a 51 year old woman who presents in follow-up status post repeat debridement of her left calf with placement of Kerecis skin graft.  She has been in wound vacs these were removed today. Ortho Exam Please see attached photos  On examination all of the left calf.  There is 100% bleeding granulation tissue in her ulcer there is no surrounding erythema maceration no warmth  Visit Diagnoses: No diagnosis found.  Plan: Today we will reason therapy with serial compression wraps.  She will follow-up in the office biweekly for dressing changes  Follow-Up Instructions: No follow-ups on file.   Imaging: No results found.  Orders:  No orders of the defined types were placed in this encounter.  No orders of the defined types were placed in this encounter.    PMFS History: Patient Active Problem List   Diagnosis Date Noted   Leg wound, left, sequela 12/10/2021   Open wound of left lower leg    Open leg wound 09/03/2021   Wound infection    Non-pressure chronic ulcer of right calf limited to breakdown of skin ([place])    Calciphylaxis of right lower extremity with nonhealing ulcer, limited to breakdown of skin ([place])    Chronic ulcer of left thigh ([place]) 08/07/2021   Calciphylaxis of left lower extremity with nonhealing ulcer with necrosis of muscle ([place]) 08/07/2021   Lower GI bleed 05/12/2021   Blindness 05/10/2021   Shingles 05/10/2021   Acute GI bleeding 04/24/2021   Acute blood loss anemia 04/23/2021   GI bleed 04/22/2021   Fever 03/31/2021   Acute on chronic heart failure with preserved ejection fraction (HFpEF) ([place]) 03/30/2021   Pressure injury of skin 03/30/2021   End stage renal disease ([place]) 11/16/2020   Calciphylaxis 11/06/2020   Non-healing  open wound of heel 11/03/2020   Diabetic foot infection ([place]) 11/01/2020   Decubitus ulcer, heel 11/01/2020   Closed nondisplaced fracture of left patella 03/50/0938   Metabolic acidosis 18/29/9371   Acute on chronic renal failure ([place]) 06/10/2020   Symptomatic anemia 06/10/2020   Acute pericardial effusion 06/10/2020   Other acute nonsuppurative otitis media, unspecified ear 11/29/2019   Chronic kidney disease, stage 4 (severe) ([place]) 03/05/2019   Skin ulcer, limited to breakdown of skin ([place]) 01/28/2019   Vitamin D deficiency 01/28/2019   Uncontrolled type 2 diabetes mellitus with chronic kidney disease, with long-term current use of insulin 09/21/2015   Hyperlipidemia 09/21/2015   Essential hypertension, benign 09/21/2015   Primary hypothyroidism 09/21/2015   Iris bomb 07/31/2012   Secondary angle-closure glaucoma 07/31/2012   Past Medical History:  Diagnosis Date   Anemia    Blindness of right eye with low vision in contralateral eye    s/p victrectomy   Diabetes mellitus, type II ([place])    Dyslipidemia    Glaucoma    History of blood transfusion    Hypertension    Hypothyroidism (acquired)    Kidney disease    Stage 5   Pneumonia     Family History  Problem Relation Age of Onset   Heart disease Mother    Diabetes Mother    Kidney disease Mother    Diabetes Father  Heart disease Father    Diabetes Brother    Colon cancer Neg Hx     Past Surgical History:  Procedure Laterality Date   ABDOMINAL AORTOGRAM W/LOWER EXTREMITY Bilateral 12/18/2020   Procedure: ABDOMINAL AORTOGRAM W/LOWER EXTREMITY;  Surgeon: Elam Dutch, MD;  Location: [facility];  Service: Cardiovascular;  Laterality: Bilateral;   ANKLE FRACTURE SURGERY Right    AV FISTULA PLACEMENT Left 08/18/2020   Procedure: LEFT ARM BRACHIOCEPHALIC ARTERIOVENOUS (AV) FISTULA CREATION;  Surgeon: Elam Dutch, MD;  Location: [facility];  Service: Vascular;  Laterality: Left;   BIOPSY  04/24/2021    Procedure: BIOPSY;  Surgeon: Eloise Harman, DO;  [facility];  Service: Endoscopy;;   CESAREAN SECTION     CHOLECYSTECTOMY     COLONOSCOPY  04/24/2021   Surgeon: Eloise Harman, DO;  nonbleeding internal hemorrhoids, 1 large (25 mm) pedunculated transverse colon polyp (prolapse type polyp) with adherent clot and stigmata of recent bleed.   COLONOSCOPY WITH PROPOFOL N/A 05/14/2021   Procedure: COLONOSCOPY WITH PROPOFOL;  Surgeon: Daneil Dolin, MD;  [facility];  Service: Endoscopy;  Laterality: N/A;   ESOPHAGOGASTRODUODENOSCOPY (EGD) WITH PROPOFOL N/A 04/24/2021   Surgeon: Eloise Harman, DO;  duodenal erosions and gastritis biopsied (pathology with peptic duodenitis, reactive gastropathy with erosions/chronic inflammation, negative for H. pylori)   EYE SURGERY     Vatrectomy   HEMOSTASIS CLIP PLACEMENT  05/14/2021   Procedure: HEMOSTASIS CLIP PLACEMENT;  Surgeon: Daneil Dolin, MD;  [facility];  Service: Endoscopy;;   IR PERC TUN PERIT CATH WO PORT S&I /IMAG  09/15/2020   IR REMOVAL TUN CV CATH W/O FL  02/19/2021   IR US GUIDE VASC ACCESS RIGHT  09/15/2020   POLYPECTOMY  04/24/2021   Procedure: POLYPECTOMY;  Surgeon: Eloise Harman, DO;  [facility];  Service: Endoscopy;;   POLYPECTOMY  05/14/2021   Procedure: POLYPECTOMY;  Surgeon: Daneil Dolin, MD;  [facility];  Service: Endoscopy;;   SKIN SPLIT GRAFT Bilateral 09/03/2021   Procedure: SKIN GRAFT BILATERAL LEGS;  Surgeon: Newt Minion, MD;  Location: [facility];  Service: Orthopedics;  Laterality: Bilateral;   SKIN SPLIT GRAFT Left 12/10/2021   Procedure: IRRIGATION AND DEBRIDEMENT LEFT CALF, APPLICATION SPLIT THICKNESS SKIN GRAFT;  Surgeon: Newt Minion, MD;  Location: [facility];  Service: Orthopedics;  Laterality: Left;   TOE SURGERY     Social History   Occupational History   Not on file  Tobacco Use   Smoking status: Never   Smokeless tobacco: Never   Vaping Use   Vaping Use: Never used  Substance and Sexual Activity   Alcohol use: No   Drug use: No   Sexual activity: Yes    Birth control/protection: Condom

## 2021-12-26 NOTE — Discharge Summary (Signed)
Discharge Diagnoses:  Principal Problem:   Leg wound, left, sequela Active Problems:   Open wound of left lower leg   Surgeries: Procedure(s): IRRIGATION AND DEBRIDEMENT LEFT CALF, APPLICATION SPLIT THICKNESS SKIN GRAFT on 12/10/2021    Consultants:   Discharged Condition: Improved  Hospital Course: Amy Moses is an 51 y.o. female who was admitted 12/10/2021 with a chief complaint of wound left leg, with a final diagnosis of Calciphylaxis Wound Left Leg.  Patient was brought to the operating room on 12/10/2021 and underwent Procedure(s): IRRIGATION AND DEBRIDEMENT LEFT CALF, APPLICATION SPLIT THICKNESS SKIN GRAFT.    Date discharged December 12, 2021 after dialysis.  Patient was given perioperative antibiotics:  Anti-infectives (From admission, onward)    Start     Dose/Rate Route Frequency Ordered Stop   12/10/21 0945  ceFAZolin (ANCEF) IVPB 2g/100 mL premix        2 g 200 mL/hr over 30 Minutes Intravenous On call to O.R. 12/10/21 0940 12/10/21 1207     .  Patient was given sequential compression devices, early ambulation, and aspirin for DVT prophylaxis.  Recent vital signs: No data found..  Recent laboratory studies: No results found.  Discharge Medications:   Allergies as of 12/12/2021       Reactions   Ace Inhibitors Cough        Medication List     TAKE these medications    acetaminophen 500 MG tablet Commonly known as: TYLENOL Take 1,000 mg by mouth every 6 (six) hours as needed for moderate pain.   aspirin EC 81 MG tablet Take 1 tablet (81 mg total) by mouth daily with breakfast.   atorvastatin 10 MG tablet Commonly known as: LIPITOR Take 1 tablet (10 mg total) by mouth daily. What changed: when to take this   HumaLOG KwikPen 100 UNIT/ML KwikPen Generic drug: insulin lispro Inject 2 Units into the skin 3 (three) times daily with meals. If eats 50% or more of meal. What changed:  how much to take additional instructions   insulin glargine  100 UNIT/ML injection Commonly known as: LANTUS Inject 0.08 mLs (8 Units total) into the skin at bedtime.   lactulose 10 GM/15ML solution Commonly known as: CHRONULAC Take 10 g by mouth daily as needed for moderate constipation.   levothyroxine 50 MCG tablet Commonly known as: SYNTHROID Take 1 tablet (50 mcg total) by mouth daily before breakfast.   oxyCODONE-acetaminophen 5-325 MG tablet Commonly known as: PERCOCET/ROXICET Take 1 tablet by mouth every 4 (four) hours as needed.   pantoprazole 40 MG tablet Commonly known as: Protonix Take 1 tablet (40 mg total) by mouth 2 (two) times daily.   sevelamer carbonate 800 MG tablet Commonly known as: RENVELA Take 1 tablet (800 mg total) by mouth 3 (three) times daily with meals. What changed: how much to take   torsemide 100 MG tablet Commonly known as: DEMADEX Take 150 mg by mouth daily.   Vitamin D (Ergocalciferol) 1.25 MG (50000 UNIT) Caps capsule Commonly known as: DRISDOL Take 50,000 Units by mouth every 14 (fourteen) days.        Diagnostic Studies: No results found.  Patient benefited maximally from their hospital stay and there were no complications.     Disposition: Discharge disposition: 01-Home or Self Care      Discharge Instructions     Call MD / Call 911   Complete by: As directed    If you experience chest pain or shortness of breath, CALL 911 and be transported to  the hospital emergency room.  If you develope a fever above 101 F, pus (white drainage) or increased drainage or redness at the wound, or calf pain, call your surgeon's office.   Constipation Prevention   Complete by: As directed    Drink plenty of fluids.  Prune juice may be helpful.  You may use a stool softener, such as Colace (over the counter) 100 mg twice a day.  Use MiraLax (over the counter) for constipation as needed.   Diet - low sodium heart healthy   Complete by: As directed    Increase activity slowly as tolerated   Complete  by: As directed    Negative Pressure Wound Therapy - Incisional   Complete by: As directed    Post-operative opioid taper instructions:   Complete by: As directed    POST-OPERATIVE OPIOID TAPER INSTRUCTIONS: It is important to wean off of your opioid medication as soon as possible. If you do not need pain medication after your surgery it is ok to stop day one. Opioids include: Codeine, Hydrocodone(Norco, Vicodin), Oxycodone(Percocet, oxycontin) and hydromorphone amongst others.  Long term and even short term use of opiods can cause: Increased pain response Dependence Constipation Depression Respiratory depression And more.  Withdrawal symptoms can include Flu like symptoms Nausea, vomiting And more Techniques to manage these symptoms Hydrate well Eat regular healthy meals Stay active Use relaxation techniques(deep breathing, meditating, yoga) Do Not substitute Alcohol to help with tapering If you have been on opioids for less than two weeks and do not have pain than it is ok to stop all together.  Plan to wean off of opioids This plan should start within one week post op of your joint replacement. Maintain the same interval or time between taking each dose and first decrease the dose.  Cut the total daily intake of opioids by one tablet each day Next start to increase the time between doses. The last dose that should be eliminated is the evening dose.          Follow-up Information     Newt Minion, MD Follow up in 1 week(s).   Specialty: Orthopedic Surgery Contact information: [place] [address] [phone]         [facility] Follow up.   Contact information: [place] [address] [phone]                  Signed: Newt Minion 12/26/2021, 10:04 AM

## 2021-12-27 ENCOUNTER — Other Ambulatory Visit: Payer: Self-pay

## 2021-12-27 ENCOUNTER — Encounter: Payer: Self-pay | Admitting: Orthopedic Surgery

## 2021-12-27 ENCOUNTER — Ambulatory Visit (INDEPENDENT_AMBULATORY_CARE_PROVIDER_SITE_OTHER): Payer: Medicare Other | Admitting: Orthopedic Surgery

## 2021-12-27 DIAGNOSIS — L97923 Non-pressure chronic ulcer of unspecified part of left lower leg with necrosis of muscle: Secondary | ICD-10-CM | POA: Diagnosis not present

## 2021-12-27 DIAGNOSIS — L98491 Non-pressure chronic ulcer of skin of other sites limited to breakdown of skin: Secondary | ICD-10-CM

## 2021-12-27 NOTE — Progress Notes (Signed)
Office Visit Note   Patient: Amy Moses           Date of Birth: Mar 13, 1971           MRN: [id number] Visit Date: 12/27/2021              Requested by: Emelda Fear, [place] [address] PCP: Emelda Fear, DO  Chief Complaint  Patient presents with   Left Leg - Routine Post Op    12/10/21 I&D left calf w/ kerecis graft      HPI: Patient is a 51 year old woman who presents for 2 separate issues.  She is status post skin graft to the left lower extremity and is undergoing compression wraps.  #2 patient states she has developed recurrent ischial tuberosity ulcers bilaterally secondary to transverse.  Assessment & Plan: Visit Diagnoses:  1. Calciphylaxis of left lower extremity with nonhealing ulcer with necrosis of muscle ([place])   2. Ischemic ulcer, limited to breakdown of skin ([place])     Plan: We will apply Mepilex dressing to the ischial ulcers.  Patient will need to continue with her air mattress and we will contact medical modalities to continue the air mattress.  The left lower extremity was wrapped with a 3 layer compression wrap.  Follow-up in 1 week.  Follow-Up Instructions: Return in about 1 week (around 01/03/2022).   Ortho Exam  Patient is alert, oriented, no adenopathy, well-dressed, normal affect, normal respiratory effort. Examination patient has 2 new superficial ischial tuberosity ulcers.  These are approximately 1 cm diameter point 1 mm deep there is superficial no exposed bone or tendon with healthy granulation tissue.  The left leg is status post biologic skin graft.  We will continue with the compression wraps approximately 75% has healthy granulation tissue.  Imaging: No results found. No images are attached to the encounter.  Labs: Lab Results  Component Value Date   HGBA1C 12.2 (H) 12/10/2021   HGBA1C 9.5 (H) 08/07/2021   HGBA1C 7.3 (H) 03/30/2021   ESRSEDRATE 40 (H) 08/07/2021   CRP 11.1 (H) 08/07/2021   REPTSTATUS  08/12/2021 FINAL 08/07/2021   GRAMSTAIN  07/08/2021    RARE WBC PRESENT,BOTH PMN AND MONONUCLEAR RARE GRAM POSITIVE RODS RARE GRAM POSITIVE COCCI IN PAIRS    CULT  08/07/2021    NO GROWTH 5 DAYS Performed at [facility], [place] [address] [place],  [address]    LABORGA CITROBACTER KOSERI 05/21/2021   LABORGA PROTEUS MIRABILIS 05/21/2021     Lab Results  Component Value Date   ALBUMIN 3.2 (L) 12/12/2021   ALBUMIN 2.7 (L) 09/04/2021   ALBUMIN 2.4 (L) 08/12/2021   PREALBUMIN 13.7 (L) 08/07/2021    Lab Results  Component Value Date   MG 2.1 05/14/2021   MG 2.8 (H) 05/11/2021   MG 2.3 04/23/2021   No results found for: VD25OH  Lab Results  Component Value Date   PREALBUMIN 13.7 (L) 08/07/2021   CBC EXTENDED Latest Ref Rng & Units 12/12/2021 12/10/2021 09/04/2021  WBC 4.0 - 10.5 K/uL 8.8 6.5 8.9  RBC 3.87 - 5.11 MIL/uL 4.30 4.60 3.49(L)  HGB 12.0 - 15.0 g/dL 11.7(L) 12.6 8.7(L)  HCT 36.0 - 46.0 % 38.1 41.1 29.8(L)  PLT 150 - 400 K/uL 178 180 276  NEUTROABS 1.7 - 7.7 K/uL - - -  LYMPHSABS 0.7 - 4.0 K/uL - - -     There is no height or weight on file to calculate BMI.  Orders:  No orders of the defined types were placed in this encounter.  No orders of the defined types were placed in this encounter.    Procedures: No procedures performed  Clinical Data: No additional findings.  ROS:  All other systems negative, except as noted in the HPI. Review of Systems  Objective: Vital Signs: LMP 08/22/2015 (Approximate)   Specialty Comments:  No specialty comments available.  PMFS History: Patient Active Problem List   Diagnosis Date Noted   Leg wound, left, sequela 12/10/2021   Open wound of left lower leg    Open leg wound 09/03/2021   Wound infection    Non-pressure chronic ulcer of right calf limited to breakdown of skin ([place])    Calciphylaxis of right lower extremity with nonhealing ulcer, limited to breakdown of skin ([place])    Chronic ulcer  of left thigh ([place]) 08/07/2021   Calciphylaxis of left lower extremity with nonhealing ulcer with necrosis of muscle ([place]) 08/07/2021   Lower GI bleed 05/12/2021   Blindness 05/10/2021   Shingles 05/10/2021   Acute GI bleeding 04/24/2021   Acute blood loss anemia 04/23/2021   GI bleed 04/22/2021   Fever 03/31/2021   Acute on chronic heart failure with preserved ejection fraction (HFpEF) ([place]) 03/30/2021   Pressure injury of skin 03/30/2021   End stage renal disease ([place]) 11/16/2020   Calciphylaxis 11/06/2020   Non-healing open wound of heel 11/03/2020   Diabetic foot infection ([place]) 11/01/2020   Decubitus ulcer, heel 11/01/2020   Closed nondisplaced fracture of left patella 84/69/6295   Metabolic acidosis 28/41/3244   Acute on chronic renal failure ([place]) 06/10/2020   Symptomatic anemia 06/10/2020   Acute pericardial effusion 06/10/2020   Other acute nonsuppurative otitis media, unspecified ear 11/29/2019   Chronic kidney disease, stage 4 (severe) ([place]) 03/05/2019   Skin ulcer, limited to breakdown of skin ([place]) 01/28/2019   Vitamin D deficiency 01/28/2019   Uncontrolled type 2 diabetes mellitus with chronic kidney disease, with long-term current use of insulin 09/21/2015   Hyperlipidemia 09/21/2015   Essential hypertension, benign 09/21/2015   Primary hypothyroidism 09/21/2015   Iris bomb 07/31/2012   Secondary angle-closure glaucoma 07/31/2012   Past Medical History:  Diagnosis Date   Anemia    Blindness of right eye with low vision in contralateral eye    s/p victrectomy   Diabetes mellitus, type II ([place])    Dyslipidemia    Glaucoma    History of blood transfusion    Hypertension    Hypothyroidism (acquired)    Kidney disease    Stage 5   Pneumonia     Family History  Problem Relation Age of Onset   Heart disease Mother    Diabetes Mother    Kidney disease Mother    Diabetes Father    Heart disease Father    Diabetes Brother    Colon cancer Neg Hx     Past  Surgical History:  Procedure Laterality Date   ABDOMINAL AORTOGRAM W/LOWER EXTREMITY Bilateral 12/18/2020   Procedure: ABDOMINAL AORTOGRAM W/LOWER EXTREMITY;  Surgeon: Elam Dutch, MD;  Location: [facility];  Service: Cardiovascular;  Laterality: Bilateral;   ANKLE FRACTURE SURGERY Right    AV FISTULA PLACEMENT Left 08/18/2020   Procedure: LEFT ARM BRACHIOCEPHALIC ARTERIOVENOUS (AV) FISTULA CREATION;  Surgeon: Elam Dutch, MD;  Location: [facility];  Service: Vascular;  Laterality: Left;   BIOPSY  04/24/2021   Procedure: BIOPSY;  Surgeon: Eloise Harman, DO;  [facility];  Service: Endoscopy;;   CESAREAN SECTION     CHOLECYSTECTOMY     COLONOSCOPY  04/24/2021   Surgeon: Eloise Harman, DO;  nonbleeding internal hemorrhoids, 1 large (25 mm) pedunculated transverse colon polyp (prolapse type polyp) with adherent clot and stigmata of recent bleed.   COLONOSCOPY WITH PROPOFOL N/A 05/14/2021   Procedure: COLONOSCOPY WITH PROPOFOL;  Surgeon: Daneil Dolin, MD;  [facility];  Service: Endoscopy;  Laterality: N/A;   ESOPHAGOGASTRODUODENOSCOPY (EGD) WITH PROPOFOL N/A 04/24/2021   Surgeon: Eloise Harman, DO;  duodenal erosions and gastritis biopsied (pathology with peptic duodenitis, reactive gastropathy with erosions/chronic inflammation, negative for H. pylori)   EYE SURGERY     Vatrectomy   HEMOSTASIS CLIP PLACEMENT  05/14/2021   Procedure: HEMOSTASIS CLIP PLACEMENT;  Surgeon: Daneil Dolin, MD;  [facility];  Service: Endoscopy;;   IR PERC TUN PERIT CATH WO PORT S&I /IMAG  09/15/2020   IR REMOVAL TUN CV CATH W/O FL  02/19/2021   IR US GUIDE VASC ACCESS RIGHT  09/15/2020   POLYPECTOMY  04/24/2021   Procedure: POLYPECTOMY;  Surgeon: Eloise Harman, DO;  [facility];  Service: Endoscopy;;   POLYPECTOMY  05/14/2021   Procedure: POLYPECTOMY;  Surgeon: Daneil Dolin, MD;  [facility];  Service: Endoscopy;;    SKIN SPLIT GRAFT Bilateral 09/03/2021   Procedure: SKIN GRAFT BILATERAL LEGS;  Surgeon: Newt Minion, MD;  Location: [facility];  Service: Orthopedics;  Laterality: Bilateral;   SKIN SPLIT GRAFT Left 12/10/2021   Procedure: IRRIGATION AND DEBRIDEMENT LEFT CALF, APPLICATION SPLIT THICKNESS SKIN GRAFT;  Surgeon: Newt Minion, MD;  Location: [facility];  Service: Orthopedics;  Laterality: Left;   TOE SURGERY     Social History   Occupational History   Not on file  Tobacco Use   Smoking status: Never   Smokeless tobacco: Never  Vaping Use   Vaping Use: Never used  Substance and Sexual Activity   Alcohol use: No   Drug use: No   Sexual activity: Yes    Birth control/protection: Condom

## 2021-12-28 ENCOUNTER — Other Ambulatory Visit: Payer: Self-pay | Admitting: Orthopedic Surgery

## 2021-12-30 LAB — VITAMIN D 25 HYDROXY (VIT D DEFICIENCY, FRACTURES): Vit D, 25-Hydroxy: 36.9

## 2021-12-30 LAB — BASIC METABOLIC PANEL
CO2: 25 — AB (ref 13–22)
Chloride: 96 — AB (ref 99–108)
Creatinine: 6 — AB (ref 0.5–1.1)
Glucose: 420

## 2021-12-30 LAB — HEMOGLOBIN A1C: Hemoglobin A1C: 13

## 2021-12-30 LAB — LIPID PANEL
Cholesterol: 101 (ref 0–200)
HDL: 42 (ref 35–70)
LDL Cholesterol: 47
Triglycerides: 58 (ref 40–160)

## 2021-12-30 LAB — HEPATIC FUNCTION PANEL
ALT: 9 U/L (ref 7–35)
AST: 12 — AB (ref 13–35)
Alkaline Phosphatase: 261 — AB (ref 25–125)

## 2021-12-30 LAB — COMPREHENSIVE METABOLIC PANEL
Calcium: 9.1 (ref 8.7–10.7)
Globulin: 2.5

## 2022-01-03 ENCOUNTER — Other Ambulatory Visit: Payer: Self-pay

## 2022-01-03 ENCOUNTER — Ambulatory Visit (INDEPENDENT_AMBULATORY_CARE_PROVIDER_SITE_OTHER): Payer: Medicare Other | Admitting: Orthopedic Surgery

## 2022-01-03 DIAGNOSIS — L97923 Non-pressure chronic ulcer of unspecified part of left lower leg with necrosis of muscle: Secondary | ICD-10-CM | POA: Diagnosis not present

## 2022-01-04 ENCOUNTER — Telehealth: Payer: Self-pay | Admitting: Orthopedic Surgery

## 2022-01-04 ENCOUNTER — Encounter: Payer: Self-pay | Admitting: Orthopedic Surgery

## 2022-01-04 NOTE — Telephone Encounter (Signed)
Re-faxed.

## 2022-01-04 NOTE — Telephone Encounter (Signed)
This information was faxed on 12/27/21, I will refax again today.

## 2022-01-04 NOTE — Telephone Encounter (Deleted)
I will have [place] do measurements and stage the ulcers at home to report back to [name]. FYI. Please send back to me.

## 2022-01-04 NOTE — Telephone Encounter (Signed)
Patient needs information faxed to [employer] in regards to her air mattress.   Fax #: [phone]

## 2022-01-04 NOTE — Progress Notes (Signed)
Office Visit Note   Patient: Amy Moses           Date of Birth: [birth year]           MRN: [id number] Visit Date: 01/03/2022              Requested by: Emelda Fear, [place] [address] PCP: Emelda Fear, DO  Chief Complaint  Patient presents with   Left Leg - Routine Post Op    12/10/21 I&D LLE Kerecis graft       HPI: Patient is a 51 year old woman who presents in follow-up status post repeat skin graft left calf for calciphylaxis.  Assessment & Plan: Visit Diagnoses:  1. Calciphylaxis of left lower extremity with nonhealing ulcer with necrosis of muscle ([place])     Plan: Patient will continue with the compression sock on the right she has excellent healing of the right lower extremity.  We will apply a Dynaflex compression wrap to the left lower extremity follow-up in 1 week.  Patient has sacral decubitus ulcers that have been treated by home health nursing with hydrofluora blue.  We will have these wound care instructions continued.  Follow-Up Instructions: Return in about 1 week (around 01/10/2022).   Ortho Exam  Patient is alert, oriented, no adenopathy, well-dressed, normal affect, normal respiratory effort. Examination the right leg has excellent healing there is a very small open wound that is flat with granulation tissue there is no signs of infection.  Left leg continues to show improvement with healthy granulation tissue the wound is flat there is superficial epithelialization around the wound edges.  Imaging: No results found.   Labs: Lab Results  Component Value Date   HGBA1C 12.2 (H) 12/10/2021   HGBA1C 9.5 (H) 08/07/2021   HGBA1C 7.3 (H) 03/30/2021   ESRSEDRATE 40 (H) 08/07/2021   CRP 11.1 (H) 08/07/2021   REPTSTATUS 08/12/2021 FINAL 08/07/2021   GRAMSTAIN  07/08/2021    RARE WBC PRESENT,BOTH PMN AND MONONUCLEAR RARE GRAM POSITIVE RODS RARE GRAM POSITIVE COCCI IN PAIRS    CULT  08/07/2021    NO GROWTH 5  DAYS Performed at [facility], [place] [address], [place], [place] [address]    LABORGA CITROBACTER KOSERI 05/21/2021   LABORGA PROTEUS MIRABILIS 05/21/2021     Lab Results  Component Value Date   ALBUMIN 3.2 (L) 12/12/2021   ALBUMIN 2.7 (L) 09/04/2021   ALBUMIN 2.4 (L) 08/12/2021   PREALBUMIN 13.7 (L) 08/07/2021    Lab Results  Component Value Date   MG 2.1 05/14/2021   MG 2.8 (H) 05/11/2021   MG 2.3 04/23/2021   No results found for: VD25OH  Lab Results  Component Value Date   PREALBUMIN 13.7 (L) 08/07/2021   CBC EXTENDED Latest Ref Rng & Units 12/12/2021 12/10/2021 09/04/2021  WBC 4.0 - 10.5 K/uL 8.8 6.5 8.9  RBC 3.87 - 5.11 MIL/uL 4.30 4.60 3.49(L)  HGB 12.0 - 15.0 g/dL 11.7(L) 12.6 8.7(L)  HCT 36.0 - 46.0 % 38.1 41.1 29.8(L)  PLT 150 - 400 K/uL 178 180 276  NEUTROABS 1.7 - 7.7 K/uL - - -  LYMPHSABS 0.7 - 4.0 K/uL - - -     There is no height or weight on file to calculate BMI.  Orders:  No orders of the defined types were placed in this encounter.  No orders of the defined types were placed in this encounter.    Procedures: No procedures performed  Clinical Data: No additional  findings.  ROS:  All other systems negative, except as noted in the HPI. Review of Systems  Objective: Vital Signs: LMP 08/22/2015 (Approximate)   Specialty Comments:  No specialty comments available.  PMFS History: Patient Active Problem List   Diagnosis Date Noted   Leg wound, left, sequela 12/10/2021   Open wound of left lower leg    Open leg wound 09/03/2021   Wound infection    Non-pressure chronic ulcer of right calf limited to breakdown of skin ([place])    Calciphylaxis of right lower extremity with nonhealing ulcer, limited to breakdown of skin ([place])    Chronic ulcer of left thigh ([place]) 08/07/2021   Calciphylaxis of left lower extremity with nonhealing ulcer with necrosis of muscle ([place]) 08/07/2021   Lower GI bleed 05/12/2021   Blindness 05/10/2021    Shingles 05/10/2021   Acute GI bleeding 04/24/2021   Acute blood loss anemia 04/23/2021   GI bleed 04/22/2021   Fever 03/31/2021   Acute on chronic heart failure with preserved ejection fraction (HFpEF) ([place]) 03/30/2021   Pressure injury of skin 03/30/2021   End stage renal disease ([place]) 11/16/2020   Calciphylaxis 11/06/2020   Non-healing open wound of heel 11/03/2020   Diabetic foot infection ([place]) 11/01/2020   Decubitus ulcer, heel 11/01/2020   Closed nondisplaced fracture of left patella 53/61/4431   Metabolic acidosis 54/00/8676   Acute on chronic renal failure ([place]) 06/10/2020   Symptomatic anemia 06/10/2020   Acute pericardial effusion 06/10/2020   Other acute nonsuppurative otitis media, unspecified ear 11/29/2019   Chronic kidney disease, stage 4 (severe) ([place]) 03/05/2019   Skin ulcer, limited to breakdown of skin ([place]) 01/28/2019   Vitamin D deficiency 01/28/2019   Uncontrolled type 2 diabetes mellitus with chronic kidney disease, with long-term current use of insulin 09/21/2015   Hyperlipidemia 09/21/2015   Essential hypertension, benign 09/21/2015   Primary hypothyroidism 09/21/2015   Iris bomb 07/31/2012   Secondary angle-closure glaucoma 07/31/2012   Past Medical History:  Diagnosis Date   Anemia    Blindness of right eye with low vision in contralateral eye    s/p victrectomy   Diabetes mellitus, type II ([place])    Dyslipidemia    Glaucoma    History of blood transfusion    Hypertension    Hypothyroidism (acquired)    Kidney disease    Stage 5   Pneumonia     Family History  Problem Relation Age of Onset   Heart disease Mother    Diabetes Mother    Kidney disease Mother    Diabetes Father    Heart disease Father    Diabetes Brother    Colon cancer Neg Hx     Past Surgical History:  Procedure Laterality Date   ABDOMINAL AORTOGRAM W/LOWER EXTREMITY Bilateral 12/18/2020   Procedure: ABDOMINAL AORTOGRAM W/LOWER EXTREMITY;  Surgeon: Elam Dutch,  MD;  Location: [facility];  Service: Cardiovascular;  Laterality: Bilateral;   ANKLE FRACTURE SURGERY Right    AV FISTULA PLACEMENT Left 08/18/2020   Procedure: LEFT ARM BRACHIOCEPHALIC ARTERIOVENOUS (AV) FISTULA CREATION;  Surgeon: Elam Dutch, MD;  Location: [facility];  Service: Vascular;  Laterality: Left;   BIOPSY  04/24/2021   Procedure: BIOPSY;  Surgeon: Eloise Harman, DO;  [facility];  Service: Endoscopy;;   CESAREAN SECTION     CHOLECYSTECTOMY     COLONOSCOPY  04/24/2021   Surgeon: Eloise Harman, DO;  nonbleeding internal hemorrhoids, 1 large (25 mm) pedunculated transverse  colon polyp (prolapse type polyp) with adherent clot and stigmata of recent bleed.   COLONOSCOPY WITH PROPOFOL N/A 05/14/2021   Procedure: COLONOSCOPY WITH PROPOFOL;  Surgeon: Daneil Dolin, MD;  [facility];  Service: Endoscopy;  Laterality: N/A;   ESOPHAGOGASTRODUODENOSCOPY (EGD) WITH PROPOFOL N/A 04/24/2021   Surgeon: Eloise Harman, DO;  duodenal erosions and gastritis biopsied (pathology with peptic duodenitis, reactive gastropathy with erosions/chronic inflammation, negative for H. pylori)   EYE SURGERY     Vatrectomy   HEMOSTASIS CLIP PLACEMENT  05/14/2021   Procedure: HEMOSTASIS CLIP PLACEMENT;  Surgeon: Daneil Dolin, MD;  [facility];  Service: Endoscopy;;   IR PERC TUN PERIT CATH WO PORT S&I /IMAG  09/15/2020   IR REMOVAL TUN CV CATH W/O FL  02/19/2021   IR US GUIDE VASC ACCESS RIGHT  09/15/2020   POLYPECTOMY  04/24/2021   Procedure: POLYPECTOMY;  Surgeon: Eloise Harman, DO;  [facility];  Service: Endoscopy;;   POLYPECTOMY  05/14/2021   Procedure: POLYPECTOMY;  Surgeon: Daneil Dolin, MD;  [facility];  Service: Endoscopy;;   SKIN SPLIT GRAFT Bilateral 09/03/2021   Procedure: SKIN GRAFT BILATERAL LEGS;  Surgeon: Newt Minion, MD;  Location: [facility];  Service: Orthopedics;  Laterality: Bilateral;   SKIN SPLIT  GRAFT Left 12/10/2021   Procedure: IRRIGATION AND DEBRIDEMENT LEFT CALF, APPLICATION SPLIT THICKNESS SKIN GRAFT;  Surgeon: Newt Minion, MD;  Location: [facility];  Service: Orthopedics;  Laterality: Left;   TOE SURGERY     Social History   Occupational History   Not on file  Tobacco Use   Smoking status: Never   Smokeless tobacco: Never  Vaping Use   Vaping Use: Never used  Substance and Sexual Activity   Alcohol use: No   Drug use: No   Sexual activity: Yes    Birth control/protection: Condom

## 2022-01-04 NOTE — Telephone Encounter (Signed)
[employer] called reporting that the letter that was given to them to have pt keep her air mattress is not approved by medicare. Their requirements are a medical note stating that pressure ulcer has to be between stage 3-4 and 8 cm or larger. Also only areas would be pelvic or torso area. So they need measurements, the stage of ulcers and how many she has in order to keep this air mattress.

## 2022-01-05 ENCOUNTER — Other Ambulatory Visit: Payer: Self-pay | Admitting: Orthopedic Surgery

## 2022-01-05 NOTE — Telephone Encounter (Signed)
Order faxed to [facility]. Pt informed of what is going on.

## 2022-01-05 NOTE — Telephone Encounter (Signed)
Called [facility] for nursing order to measure sacral ulcers. All nurses are in a meeting. They suggested to fax over order to them today.

## 2022-01-07 ENCOUNTER — Telehealth: Payer: Self-pay | Admitting: *Deleted

## 2022-01-07 NOTE — Telephone Encounter (Signed)
Note written with information and faxed to [employer] for her air mattress.

## 2022-01-07 NOTE — Telephone Encounter (Signed)
Amy Moses called with the measurments of the pressure ulcers on both sides of pt buttocks  Left lower buttocks- 3.5x3.5 x 0.2 stage 2 pressure ulcer  Right lower buttocks- 6 cm x2.5 x5.2 stage 2 pressure ulcer.   If any questions please call [phone]

## 2022-01-07 NOTE — Telephone Encounter (Signed)
Where you waiting for these measurements?

## 2022-01-10 ENCOUNTER — Encounter: Payer: Self-pay | Admitting: Orthopedic Surgery

## 2022-01-10 ENCOUNTER — Ambulatory Visit (INDEPENDENT_AMBULATORY_CARE_PROVIDER_SITE_OTHER): Payer: Medicare Other | Admitting: Orthopedic Surgery

## 2022-01-10 ENCOUNTER — Other Ambulatory Visit: Payer: Self-pay

## 2022-01-10 DIAGNOSIS — L97923 Non-pressure chronic ulcer of unspecified part of left lower leg with necrosis of muscle: Secondary | ICD-10-CM | POA: Diagnosis not present

## 2022-01-10 DIAGNOSIS — L97911 Non-pressure chronic ulcer of unspecified part of right lower leg limited to breakdown of skin: Secondary | ICD-10-CM | POA: Diagnosis not present

## 2022-01-10 NOTE — Progress Notes (Signed)
Office Visit Note   Patient: Leiloni Smithers           Date of Birth: Apr 30, 1971           MRN: [id number] Visit Date: 01/10/2022              Requested by: Emelda Fear, [place] [address] PCP: Emelda Fear, DO  Chief Complaint  Patient presents with   Left Leg - Routine Post Op    12/10/21 I&D LLE Ke[place]is graft       HPI: Patient is a 51 year old woman who presents in follow-up for calciphylaxis both legs.  The previous skin graft of the right leg had healed but she is developing some increased swelling in the right leg and developing recurrent ulceration.  Assessment & Plan: Visit Diagnoses:  1. Calciphylaxis of left lower extremity with nonhealing ulcer with necrosis of muscle (HCC)   2. Calciphylaxis of right lower extremity with nonhealing ulcer, limited to breakdown of skin ([place])     Plan: We will apply the Dynaflex compression wrap to both legs follow-up next week for a nurse visit only.  I have recommended pursuing lymphedema pumps for her legs as she will need this long-term.  Follow-Up Instructions: Return in about 1 week (around 01/17/2022).   Ortho Exam  Patient is alert, oriented, no adenopathy, well-dressed, normal affect, normal respiratory effort. Examination the right leg has healthy granulation tissue but has started to breakdown from its previous completely healed condition.  There is no cellulitis the wound is flat.  The wound is 3 x 6 cm and 1 mm deep.  Left leg has healthy granulation tissue with superficial epithelialization around the wound edges.  Imaging: No results found.     Labs: Lab Results  Component Value Date   HGBA1C 12.2 (H) 12/10/2021   HGBA1C 9.5 (H) 08/07/2021   HGBA1C 7.3 (H) 03/30/2021   ESRSEDRATE 40 (H) 08/07/2021   CRP 11.1 (H) 08/07/2021   REPTSTATUS 08/12/2021 FINAL 08/07/2021   GRAMSTAIN  07/08/2021    RARE WBC PRESENT,BOTH PMN AND MONONUCLEAR RARE GRAM POSITIVE RODS RARE GRAM POSITIVE  COCCI IN PAIRS    CULT  08/07/2021    NO GROWTH 5 DAYS Performed at [facility], [place] [address] [place], [place] [address]    LABORGA CITROBACTER KOSERI 05/21/2021   LABORGA PROTEUS MIRABILIS 05/21/2021     Lab Results  Component Value Date   ALBUMIN 3.2 (L) 12/12/2021   ALBUMIN 2.7 (L) 09/04/2021   ALBUMIN 2.4 (L) 08/12/2021   PREALBUMIN 13.7 (L) 08/07/2021    Lab Results  Component Value Date   MG 2.1 05/14/2021   MG 2.8 (H) 05/11/2021   MG 2.3 04/23/2021   No results found for: VD25OH  Lab Results  Component Value Date   PREALBUMIN 13.7 (L) 08/07/2021   CBC EXTENDED Latest Ref Rng & Units 12/12/2021 12/10/2021 09/04/2021  WBC 4.0 - 10.5 K/uL 8.8 6.5 8.9  RBC 3.87 - 5.11 MIL/uL 4.30 4.60 3.49(L)  HGB 12.0 - 15.0 g/dL 11.7(L) 12.6 8.7(L)  HCT 36.0 - 46.0 % 38.1 41.1 29.8(L)  PLT 150 - 400 K/uL 178 180 276  NEUTROABS 1.7 - 7.7 K/uL - - -  LYMPHSABS 0.7 - 4.0 K/uL - - -     There is no height or weight on file to calculate BMI.  Orders:  No orders of the defined types were placed in this encounter.  No orders of the defined types  were placed in this encounter.    Procedures: No procedures performed  Clinical Data: No additional findings.  ROS:  All other systems negative, except as noted in the HPI. Review of Systems  Objective: Vital Signs: LMP 08/22/2015 (Approximate)   Specialty Comments:  No specialty comments available.  PMFS History: Patient Active Problem List   Diagnosis Date Noted   Leg wound, left, sequela 12/10/2021   Open wound of left lower leg    Open leg wound 09/03/2021   Wound infection    Non-pressure chronic ulcer of right calf limited to breakdown of skin ([place])    Calciphylaxis of right lower extremity with nonhealing ulcer, limited to breakdown of skin ([place])    Chronic ulcer of left thigh ([place]) 08/07/2021   Calciphylaxis of left lower extremity with nonhealing ulcer with necrosis of muscle ([place]) 08/07/2021   Lower  GI bleed 05/12/2021   Blindness 05/10/2021   Shingles 05/10/2021   Acute GI bleeding 04/24/2021   Acute blood loss anemia 04/23/2021   GI bleed 04/22/2021   Fever 03/31/2021   Acute on chronic heart failure with preserved ejection fraction (HFpEF) ([place]) 03/30/2021   Pressure injury of skin 03/30/2021   End stage renal disease ([place]) 11/16/2020   Calciphylaxis 11/06/2020   Non-healing open wound of heel 11/03/2020   Diabetic foot infection ([place]) 11/01/2020   Decubitus ulcer, heel 11/01/2020   Closed nondisplaced fracture of left patella 25/36/6440   Metabolic acidosis 34/74/2595   Acute on chronic renal failure ([place]) 06/10/2020   Symptomatic anemia 06/10/2020   Acute pericardial effusion 06/10/2020   Other acute nonsuppurative otitis media, unspecified ear 11/29/2019   Chronic kidney disease, stage 4 (severe) ([place]) 03/05/2019   Skin ulcer, limited to breakdown of skin ([place]) 01/28/2019   Vitamin D deficiency 01/28/2019   Uncontrolled type 2 diabetes mellitus with chronic kidney disease, with long-term current use of insulin 09/21/2015   Hyperlipidemia 09/21/2015   Essential hypertension, benign 09/21/2015   Primary hypothyroidism 09/21/2015   Iris bomb 07/31/2012   Secondary angle-closure glaucoma 07/31/2012   Past Medical History:  Diagnosis Date   Anemia    Blindness of right eye with low vision in contralateral eye    s/p victrectomy   Diabetes mellitus, type II ([place])    Dyslipidemia    Glaucoma    History of blood transfusion    Hypertension    Hypothyroidism (acquired)    Kidney disease    Stage 5   Pneumonia     Family History  Problem Relation Age of Onset   Heart disease Mother    Diabetes Mother    Kidney disease Mother    Diabetes Father    Heart disease Father    Diabetes Brother    Colon cancer Neg Hx     Past Surgical History:  Procedure Laterality Date   ABDOMINAL AORTOGRAM W/LOWER EXTREMITY Bilateral 12/18/2020   Procedure: ABDOMINAL AORTOGRAM  W/LOWER EXTREMITY;  Surgeon: Elam Dutch, MD;  Location: [facility];  Service: Cardiovascular;  Laterality: Bilateral;   ANKLE FRACTURE SURGERY Right    AV FISTULA PLACEMENT Left 08/18/2020   Procedure: LEFT ARM BRACHIOCEPHALIC ARTERIOVENOUS (AV) FISTULA CREATION;  Surgeon: Elam Dutch, MD;  Location: [facility];  Service: Vascular;  Laterality: Left;   BIOPSY  04/24/2021   Procedure: BIOPSY;  Surgeon: Eloise Harman, DO;  [facility];  Service: Endoscopy;;   CESAREAN SECTION     CHOLECYSTECTOMY     COLONOSCOPY  04/24/2021  Surgeon: Eloise Harman, DO;  nonbleeding internal hemorrhoids, 1 large (25 mm) pedunculated transverse colon polyp (prolapse type polyp) with adherent clot and stigmata of recent bleed.   COLONOSCOPY WITH PROPOFOL N/A 05/14/2021   Procedure: COLONOSCOPY WITH PROPOFOL;  Surgeon: Daneil Dolin, MD;  [facility];  Service: Endoscopy;  Laterality: N/A;   ESOPHAGOGASTRODUODENOSCOPY (EGD) WITH PROPOFOL N/A 04/24/2021   Surgeon: Eloise Harman, DO;  duodenal erosions and gastritis biopsied (pathology with peptic duodenitis, reactive gastropathy with erosions/chronic inflammation, negative for H. pylori)   EYE SURGERY     Vatrectomy   HEMOSTASIS CLIP PLACEMENT  05/14/2021   Procedure: HEMOSTASIS CLIP PLACEMENT;  Surgeon: Daneil Dolin, MD;  [facility];  Service: Endoscopy;;   IR PERC TUN PERIT CATH WO PORT S&I /IMAG  09/15/2020   IR REMOVAL TUN CV CATH W/O FL  02/19/2021   IR US GUIDE VASC ACCESS RIGHT  09/15/2020   POLYPECTOMY  04/24/2021   Procedure: POLYPECTOMY;  Surgeon: Eloise Harman, DO;  [facility];  Service: Endoscopy;;   POLYPECTOMY  05/14/2021   Procedure: POLYPECTOMY;  Surgeon: Daneil Dolin, MD;  [facility];  Service: Endoscopy;;   SKIN SPLIT GRAFT Bilateral 09/03/2021   Procedure: SKIN GRAFT BILATERAL LEGS;  Surgeon: Newt Minion, MD;  Location: [facility];  Service:  Orthopedics;  Laterality: Bilateral;   SKIN SPLIT GRAFT Left 12/10/2021   Procedure: IRRIGATION AND DEBRIDEMENT LEFT CALF, APPLICATION SPLIT THICKNESS SKIN GRAFT;  Surgeon: Newt Minion, MD;  Location: [facility];  Service: Orthopedics;  Laterality: Left;   TOE SURGERY     Social History   Occupational History   Not on file  Tobacco Use   Smoking status: Never   Smokeless tobacco: Never  Vaping Use   Vaping Use: Never used  Substance and Sexual Activity   Alcohol use: No   Drug use: No   Sexual activity: Yes    Birth control/protection: Condom

## 2022-01-17 ENCOUNTER — Encounter: Payer: Medicare Other | Admitting: Orthopedic Surgery

## 2022-01-18 ENCOUNTER — Encounter: Payer: Self-pay | Admitting: Orthopedic Surgery

## 2022-01-18 ENCOUNTER — Ambulatory Visit (INDEPENDENT_AMBULATORY_CARE_PROVIDER_SITE_OTHER): Payer: Medicare Other | Admitting: Orthopedic Surgery

## 2022-01-18 DIAGNOSIS — L97911 Non-pressure chronic ulcer of unspecified part of right lower leg limited to breakdown of skin: Secondary | ICD-10-CM

## 2022-01-18 DIAGNOSIS — L97923 Non-pressure chronic ulcer of unspecified part of left lower leg with necrosis of muscle: Secondary | ICD-10-CM

## 2022-01-18 NOTE — Progress Notes (Signed)
Office Visit Note   Patient: Amy Moses           Date of Birth: 1970-12-13           MRN: [id number] Visit Date: 01/18/2022              Requested by: Emelda Fear, [place] [address] PCP: Emelda Fear, DO  Chief Complaint  Patient presents with   Left Leg - Routine Post Op   Right Leg - Follow-up     Elevate her extremities.  This is strictly important due to her venous and lymphatic insufficiency. HPI: Patient is a 51 year old woman who presents in follow-up status post skin grafting for both lower extremities for calciphylaxis.  Patient has shown slow steady improvement.  Assessment & Plan: Visit Diagnoses:  1. Calciphylaxis of left lower extremity with nonhealing ulcer with necrosis of muscle (HCC)   2. Calciphylaxis of right lower extremity with nonhealing ulcer, limited to breakdown of skin ([place])     Plan: We will continue to work on obtaining the lymphedema pumps for both lower extremities due to the persistent swelling of the entire lower extremities secondary to venous and lymphatic insufficiency.  We will reapply the 3 layer compression wraps to both legs.  We will need to order patient a new wheelchair her current wheelchair is broken.  She will need the wheelchair for ambulation within the home and the community.  She will need adjustable bilateral rests to elevate her legs.  She will need the cushion as well due to the prolonged time in the chair and her history of sacral ulcers.  Follow-Up Instructions: Return in about 1 week (around 01/25/2022).   Ortho Exam  Patient is alert, oriented, no adenopathy, well-dressed, normal affect, normal respiratory effort. Examination the wound bed on both legs continues to heal and improved there is superficial epithelialization around the wound edges the wounds are flat with healthy granulation tissue.  There is a mild ischemic area on the left lower extremity approximately a centimeter in  diameter.  There is no odor no drainage no cellulitis.  Imaging: No results found.    Labs: Lab Results  Component Value Date   HGBA1C 12.2 (H) 12/10/2021   HGBA1C 9.5 (H) 08/07/2021   HGBA1C 7.3 (H) 03/30/2021   ESRSEDRATE 40 (H) 08/07/2021   CRP 11.1 (H) 08/07/2021   REPTSTATUS 08/12/2021 FINAL 08/07/2021   GRAMSTAIN  07/08/2021    RARE WBC PRESENT,BOTH PMN AND MONONUCLEAR RARE GRAM POSITIVE RODS RARE GRAM POSITIVE COCCI IN PAIRS    CULT  08/07/2021    NO GROWTH 5 DAYS Performed at [facility], [place] [address], [place], [place] [address]    LABORGA CITROBACTER KOSERI 05/21/2021   LABORGA PROTEUS MIRABILIS 05/21/2021     Lab Results  Component Value Date   ALBUMIN 3.2 (L) 12/12/2021   ALBUMIN 2.7 (L) 09/04/2021   ALBUMIN 2.4 (L) 08/12/2021   PREALBUMIN 13.7 (L) 08/07/2021    Lab Results  Component Value Date   MG 2.1 05/14/2021   MG 2.8 (H) 05/11/2021   MG 2.3 04/23/2021   No results found for: VD25OH  Lab Results  Component Value Date   PREALBUMIN 13.7 (L) 08/07/2021   CBC EXTENDED Latest Ref Rng & Units 12/12/2021 12/10/2021 09/04/2021  WBC 4.0 - 10.5 K/uL 8.8 6.5 8.9  RBC 3.87 - 5.11 MIL/uL 4.30 4.60 3.49(L)  HGB 12.0 - 15.0 g/dL 11.7(L) 12.6 8.7(L)  HCT 36.0 - 46.0 %  38.1 41.1 29.8(L)  PLT 150 - 400 K/uL 178 180 276  NEUTROABS 1.7 - 7.7 K/uL - - -  LYMPHSABS 0.7 - 4.0 K/uL - - -     There is no height or weight on file to calculate BMI.  Orders:  No orders of the defined types were placed in this encounter.  No orders of the defined types were placed in this encounter.    Procedures: No procedures performed  Clinical Data: No additional findings.  ROS:  All other systems negative, except as noted in the HPI. Review of Systems  Objective: Vital Signs: LMP 08/22/2015 (Approximate)   Specialty Comments:  No specialty comments available.  PMFS History: Patient Active Problem List   Diagnosis Date Noted   Leg wound, left,  sequela 12/10/2021   Open wound of left lower leg    Open leg wound 09/03/2021   Wound infection    Non-pressure chronic ulcer of right calf limited to breakdown of skin ([place])    Calciphylaxis of right lower extremity with nonhealing ulcer, limited to breakdown of skin ([place])    Chronic ulcer of left thigh ([place]) 08/07/2021   Calciphylaxis of left lower extremity with nonhealing ulcer with necrosis of muscle ([place]) 08/07/2021   Lower GI bleed 05/12/2021   Blindness 05/10/2021   Shingles 05/10/2021   Acute GI bleeding 04/24/2021   Acute blood loss anemia 04/23/2021   GI bleed 04/22/2021   Fever 03/31/2021   Acute on chronic heart failure with preserved ejection fraction (HFpEF) ([place]) 03/30/2021   Pressure injury of skin 03/30/2021   End stage renal disease ([place]) 11/16/2020   Calciphylaxis 11/06/2020   Non-healing open wound of heel 11/03/2020   Diabetic foot infection ([name]) 11/01/2020   Decubitus ulcer, heel 11/01/2020   Closed nondisplaced fracture of left patella 12/45/8099   Metabolic acidosis 83/38/2505   Acute on chronic renal failure ([place]) 06/10/2020   Symptomatic anemia 06/10/2020   Acute pericardial effusion 06/10/2020   Other acute nonsuppurative otitis media, unspecified ear 11/29/2019   Chronic kidney disease, stage 4 (severe) ([place]) 03/05/2019   Skin ulcer, limited to breakdown of skin ([place]) 01/28/2019   Vitamin D deficiency 01/28/2019   Uncontrolled type 2 diabetes mellitus with chronic kidney disease, with long-term current use of insulin 09/21/2015   Hyperlipidemia 09/21/2015   Essential hypertension, benign 09/21/2015   Primary hypothyroidism 09/21/2015   Iris bomb 07/31/2012   Secondary angle-closure glaucoma 07/31/2012   Past Medical History:  Diagnosis Date   Anemia    Blindness of right eye with low vision in contralateral eye    s/p victrectomy   Diabetes mellitus, type II ([place])    Dyslipidemia    Glaucoma    History of blood transfusion    Hypertension     Hypothyroidism (acquired)    Kidney disease    Stage 5   Pneumonia     Family History  Problem Relation Age of Onset   Heart disease Mother    Diabetes Mother    Kidney disease Mother    Diabetes Father    Heart disease Father    Diabetes Brother    Colon cancer Neg Hx     Past Surgical History:  Procedure Laterality Date   ABDOMINAL AORTOGRAM W/LOWER EXTREMITY Bilateral 12/18/2020   Procedure: ABDOMINAL AORTOGRAM W/LOWER EXTREMITY;  Surgeon: Elam Dutch, MD;  Location: [facility];  Service: Cardiovascular;  Laterality: Bilateral;   ANKLE FRACTURE SURGERY Right    AV FISTULA PLACEMENT Left  08/18/2020   Procedure: LEFT ARM BRACHIOCEPHALIC ARTERIOVENOUS (AV) FISTULA CREATION;  Surgeon: Elam Dutch, MD;  Location: [facility];  Service: Vascular;  Laterality: Left;   BIOPSY  04/24/2021   Procedure: BIOPSY;  Surgeon: Eloise Harman, DO;  [facility];  Service: Endoscopy;;   CESAREAN SECTION     CHOLECYSTECTOMY     COLONOSCOPY  04/24/2021   Surgeon: Eloise Harman, DO;  nonbleeding internal hemorrhoids, 1 large (25 mm) pedunculated transverse colon polyp (prolapse type polyp) with adherent clot and stigmata of recent bleed.   COLONOSCOPY WITH PROPOFOL N/A 05/14/2021   Procedure: COLONOSCOPY WITH PROPOFOL;  Surgeon: Daneil Dolin, MD;  [facility];  Service: Endoscopy;  Laterality: N/A;   ESOPHAGOGASTRODUODENOSCOPY (EGD) WITH PROPOFOL N/A 04/24/2021   Surgeon: Eloise Harman, DO;  duodenal erosions and gastritis biopsied (pathology with peptic duodenitis, reactive gastropathy with erosions/chronic inflammation, negative for H. pylori)   EYE SURGERY     Vatrectomy   HEMOSTASIS CLIP PLACEMENT  05/14/2021   Procedure: HEMOSTASIS CLIP PLACEMENT;  Surgeon: Daneil Dolin, MD;  [facility];  Service: Endoscopy;;   IR PERC TUN PERIT CATH WO PORT S&I /IMAG  09/15/2020   IR REMOVAL TUN CV CATH W/O FL  02/19/2021   IR US GUIDE  VASC ACCESS RIGHT  09/15/2020   POLYPECTOMY  04/24/2021   Procedure: POLYPECTOMY;  Surgeon: Eloise Harman, DO;  [facility];  Service: Endoscopy;;   POLYPECTOMY  05/14/2021   Procedure: POLYPECTOMY;  Surgeon: Daneil Dolin, MD;  [facility];  Service: Endoscopy;;   SKIN SPLIT GRAFT Bilateral 09/03/2021   Procedure: SKIN GRAFT BILATERAL LEGS;  Surgeon: Newt Minion, MD;  Location: [facility];  Service: Orthopedics;  Laterality: Bilateral;   SKIN SPLIT GRAFT Left 12/10/2021   Procedure: IRRIGATION AND DEBRIDEMENT LEFT CALF, APPLICATION SPLIT THICKNESS SKIN GRAFT;  Surgeon: Newt Minion, MD;  Location: [facility];  Service: Orthopedics;  Laterality: Left;   TOE SURGERY     Social History   Occupational History   Not on file  Tobacco Use   Smoking status: Never   Smokeless tobacco: Never  Vaping Use   Vaping Use: Never used  Substance and Sexual Activity   Alcohol use: No   Drug use: No   Sexual activity: Yes    Birth control/protection: Condom

## 2022-01-20 ENCOUNTER — Telehealth: Payer: Self-pay | Admitting: Internal Medicine

## 2022-01-20 ENCOUNTER — Ambulatory Visit: Payer: PRIVATE HEALTH INSURANCE | Admitting: Orthopedic Surgery

## 2022-01-20 NOTE — Telephone Encounter (Signed)
Patient called and said that her pantoprozole needs prior authorization now  ?

## 2022-01-20 NOTE — Telephone Encounter (Signed)
This pt is needing for [name] to do a PA on her pantoprazole but we were not the ones who prescribed it. You have seen this pt one time. Please advise whether to do a PA or for the pt to contact the Dr who wrote it? ?

## 2022-01-21 ENCOUNTER — Telehealth: Payer: Self-pay | Admitting: Radiology

## 2022-01-21 NOTE — Telephone Encounter (Signed)
noted 

## 2022-01-21 NOTE — Telephone Encounter (Signed)
Phoned and spoke with the pt, she has [place] Medicaid and gave me the number to try to do a PA for her Pantoprazole on Tuesday when I return ?

## 2022-01-21 NOTE — Telephone Encounter (Signed)
Noted. Will keep open for next week at next apt. ?

## 2022-01-21 NOTE — Telephone Encounter (Signed)
Pantoprazole was prescribed by hospitalist after being discharged.  We will need to take over chronic management of this.  Okay to perform prior Auth.  Thank you ?

## 2022-01-21 NOTE — Telephone Encounter (Signed)
Received voicemail from [place] with [facility]. A representative from [facility] came to their facility today requesting clearance for patient to use their lymph edema pump. Per Geraldine Solar, they have not treated patient for this and wanted to know if you would address this when patient returns to the office. ? ?If you have any questions, please call Jetta at [phone]. If voicemail picks up, please leave best call back number for her to call back. ? ? ?

## 2022-01-24 ENCOUNTER — Encounter (HOSPITAL_COMMUNITY): Payer: Self-pay

## 2022-01-24 ENCOUNTER — Inpatient Hospital Stay (HOSPITAL_COMMUNITY)
Admission: EM | Admit: 2022-01-24 | Discharge: 2022-01-26 | DRG: 592 | Disposition: A | Discharge status: Home Health | Payer: Medicare Other | Attending: Internal Medicine | Admitting: Internal Medicine

## 2022-01-24 ENCOUNTER — Ambulatory Visit (INDEPENDENT_AMBULATORY_CARE_PROVIDER_SITE_OTHER): Payer: Medicare Other | Admitting: Orthopedic Surgery

## 2022-01-24 ENCOUNTER — Encounter: Payer: Self-pay | Admitting: Orthopedic Surgery

## 2022-01-24 ENCOUNTER — Other Ambulatory Visit: Payer: Self-pay

## 2022-01-24 DIAGNOSIS — Z833 Family history of diabetes mellitus: Secondary | ICD-10-CM | POA: Diagnosis not present

## 2022-01-24 DIAGNOSIS — L97529 Non-pressure chronic ulcer of other part of left foot with unspecified severity: Secondary | ICD-10-CM | POA: Diagnosis present

## 2022-01-24 DIAGNOSIS — L89159 Pressure ulcer of sacral region, unspecified stage: Secondary | ICD-10-CM

## 2022-01-24 DIAGNOSIS — D649 Anemia, unspecified: Secondary | ICD-10-CM

## 2022-01-24 DIAGNOSIS — L89214 Pressure ulcer of right hip, stage 4: Secondary | ICD-10-CM | POA: Diagnosis present

## 2022-01-24 DIAGNOSIS — I120 Hypertensive chronic kidney disease with stage 5 chronic kidney disease or end stage renal disease: Secondary | ICD-10-CM | POA: Diagnosis present

## 2022-01-24 DIAGNOSIS — I10 Essential (primary) hypertension: Secondary | ICD-10-CM | POA: Diagnosis not present

## 2022-01-24 DIAGNOSIS — H5461 Unqualified visual loss, right eye, normal vision left eye: Secondary | ICD-10-CM | POA: Diagnosis present

## 2022-01-24 DIAGNOSIS — Z6831 Body mass index (BMI) 31.0-31.9, adult: Secondary | ICD-10-CM | POA: Diagnosis not present

## 2022-01-24 DIAGNOSIS — E119 Type 2 diabetes mellitus without complications: Secondary | ICD-10-CM

## 2022-01-24 DIAGNOSIS — L97519 Non-pressure chronic ulcer of other part of right foot with unspecified severity: Principal | ICD-10-CM | POA: Diagnosis present

## 2022-01-24 DIAGNOSIS — Z888 Allergy status to other drugs, medicaments and biological substances status: Secondary | ICD-10-CM

## 2022-01-24 DIAGNOSIS — Z79890 Hormone replacement therapy (postmenopausal): Secondary | ICD-10-CM

## 2022-01-24 DIAGNOSIS — E1122 Type 2 diabetes mellitus with diabetic chronic kidney disease: Secondary | ICD-10-CM | POA: Diagnosis present

## 2022-01-24 DIAGNOSIS — E039 Hypothyroidism, unspecified: Secondary | ICD-10-CM | POA: Diagnosis not present

## 2022-01-24 DIAGNOSIS — D631 Anemia in chronic kidney disease: Secondary | ICD-10-CM

## 2022-01-24 DIAGNOSIS — L89224 Pressure ulcer of left hip, stage 4: Secondary | ICD-10-CM | POA: Diagnosis present

## 2022-01-24 DIAGNOSIS — Z794 Long term (current) use of insulin: Secondary | ICD-10-CM

## 2022-01-24 DIAGNOSIS — E1140 Type 2 diabetes mellitus with diabetic neuropathy, unspecified: Secondary | ICD-10-CM | POA: Diagnosis present

## 2022-01-24 DIAGNOSIS — L97512 Non-pressure chronic ulcer of other part of right foot with fat layer exposed: Secondary | ICD-10-CM | POA: Diagnosis not present

## 2022-01-24 DIAGNOSIS — Z8249 Family history of ischemic heart disease and other diseases of the circulatory system: Secondary | ICD-10-CM | POA: Diagnosis not present

## 2022-01-24 DIAGNOSIS — E1165 Type 2 diabetes mellitus with hyperglycemia: Secondary | ICD-10-CM | POA: Diagnosis present

## 2022-01-24 DIAGNOSIS — Z79899 Other long term (current) drug therapy: Secondary | ICD-10-CM

## 2022-01-24 DIAGNOSIS — N2581 Secondary hyperparathyroidism of renal origin: Secondary | ICD-10-CM | POA: Diagnosis present

## 2022-01-24 DIAGNOSIS — H547 Unspecified visual loss: Secondary | ICD-10-CM

## 2022-01-24 DIAGNOSIS — E872 Acidosis, unspecified: Secondary | ICD-10-CM | POA: Diagnosis present

## 2022-01-24 DIAGNOSIS — N186 End stage renal disease: Secondary | ICD-10-CM | POA: Diagnosis present

## 2022-01-24 DIAGNOSIS — Z841 Family history of disorders of kidney and ureter: Secondary | ICD-10-CM

## 2022-01-24 DIAGNOSIS — E8729 Other acidosis: Secondary | ICD-10-CM

## 2022-01-24 DIAGNOSIS — L97911 Non-pressure chronic ulcer of unspecified part of right lower leg limited to breakdown of skin: Secondary | ICD-10-CM

## 2022-01-24 DIAGNOSIS — E782 Mixed hyperlipidemia: Secondary | ICD-10-CM | POA: Diagnosis present

## 2022-01-24 DIAGNOSIS — I739 Peripheral vascular disease, unspecified: Secondary | ICD-10-CM | POA: Diagnosis not present

## 2022-01-24 DIAGNOSIS — L97923 Non-pressure chronic ulcer of unspecified part of left lower leg with necrosis of muscle: Secondary | ICD-10-CM | POA: Diagnosis not present

## 2022-01-24 DIAGNOSIS — Z992 Dependence on renal dialysis: Secondary | ICD-10-CM

## 2022-01-24 DIAGNOSIS — Z20822 Contact with and (suspected) exposure to covid-19: Secondary | ICD-10-CM | POA: Diagnosis present

## 2022-01-24 DIAGNOSIS — E785 Hyperlipidemia, unspecified: Secondary | ICD-10-CM | POA: Diagnosis not present

## 2022-01-24 DIAGNOSIS — H409 Unspecified glaucoma: Secondary | ICD-10-CM | POA: Diagnosis present

## 2022-01-24 DIAGNOSIS — E669 Obesity, unspecified: Secondary | ICD-10-CM

## 2022-01-24 DIAGNOSIS — E118 Type 2 diabetes mellitus with unspecified complications: Secondary | ICD-10-CM | POA: Diagnosis not present

## 2022-01-24 DIAGNOSIS — Z7982 Long term (current) use of aspirin: Secondary | ICD-10-CM

## 2022-01-24 LAB — COMPREHENSIVE METABOLIC PANEL
ALT: 10 U/L (ref 0–44)
AST: 11 U/L — ABNORMAL LOW (ref 15–41)
Albumin: 3.3 g/dL — ABNORMAL LOW (ref 3.5–5.0)
Alkaline Phosphatase: 142 U/L — ABNORMAL HIGH (ref 38–126)
Anion gap: 16 — ABNORMAL HIGH (ref 5–15)
BUN: 48 mg/dL — ABNORMAL HIGH (ref 6–20)
CO2: 23 mmol/L (ref 22–32)
Calcium: 9.2 mg/dL (ref 8.9–10.3)
Chloride: 93 mmol/L — ABNORMAL LOW (ref 98–111)
Creatinine, Ser: 5.59 mg/dL — ABNORMAL HIGH (ref 0.44–1.00)
GFR, Estimated: 9 mL/min — ABNORMAL LOW (ref >60)
Glucose, Bld: 395 mg/dL — ABNORMAL HIGH (ref 70–99)
Potassium: 4.6 mmol/L (ref 3.5–5.1)
Sodium: 132 mmol/L — ABNORMAL LOW (ref 135–145)
Total Bilirubin: 0.9 mg/dL (ref 0.3–1.2)
Total Protein: 7 g/dL (ref 6.5–8.1)

## 2022-01-24 LAB — CBC WITH DIFFERENTIAL/PLATELET
Abs Immature Granulocytes: 0.03 10*3/uL (ref 0.00–0.07)
Basophils Absolute: 0.1 10*3/uL (ref 0.0–0.1)
Basophils Relative: 1 %
Eosinophils Absolute: 0.4 10*3/uL (ref 0.0–0.5)
Eosinophils Relative: 4 %
HCT: 37.9 % (ref 36.0–46.0)
Hemoglobin: 11.7 g/dL — ABNORMAL LOW (ref 12.0–15.0)
Immature Granulocytes: 0 %
Lymphocytes Relative: 12 %
Lymphs Abs: 1.3 10*3/uL (ref 0.7–4.0)
MCH: 27.3 pg (ref 26.0–34.0)
MCHC: 30.9 g/dL (ref 30.0–36.0)
MCV: 88.3 fL (ref 80.0–100.0)
Monocytes Absolute: 0.7 10*3/uL (ref 0.1–1.0)
Monocytes Relative: 7 %
Neutro Abs: 8.1 10*3/uL — ABNORMAL HIGH (ref 1.7–7.7)
Neutrophils Relative %: 76 %
Platelets: 201 10*3/uL (ref 150–400)
RBC: 4.29 MIL/uL (ref 3.87–5.11)
RDW: 14.3 % (ref 11.5–15.5)
WBC: 10.6 10*3/uL — ABNORMAL HIGH (ref 4.0–10.5)
nRBC: 0 % (ref 0.0–0.2)

## 2022-01-24 LAB — I-STAT CHEM 8, ED
BUN: 48 mg/dL — ABNORMAL HIGH (ref 6–20)
Calcium, Ion: 1.11 mmol/L — ABNORMAL LOW (ref 1.15–1.40)
Chloride: 96 mmol/L — ABNORMAL LOW (ref 98–111)
Creatinine, Ser: 5.8 mg/dL — ABNORMAL HIGH (ref 0.44–1.00)
Glucose, Bld: 392 mg/dL — ABNORMAL HIGH (ref 70–99)
HCT: 40 % (ref 36.0–46.0)
Hemoglobin: 13.6 g/dL (ref 12.0–15.0)
Potassium: 4.6 mmol/L (ref 3.5–5.1)
Sodium: 132 mmol/L — ABNORMAL LOW (ref 135–145)
TCO2: 27 mmol/L (ref 22–32)

## 2022-01-24 LAB — BLOOD GAS, VENOUS
Acid-Base Excess: 2.6 mmol/L — ABNORMAL HIGH (ref 0.0–2.0)
Bicarbonate: 28.4 mmol/L — ABNORMAL HIGH (ref 20.0–28.0)
Drawn by: 6752
O2 Saturation: 78.1 %
Patient temperature: 36.5
pCO2, Ven: 47 mmHg (ref 44–60)
pH, Ven: 7.39 (ref 7.25–7.43)
pO2, Ven: 44 mmHg (ref 32–45)

## 2022-01-24 LAB — RESP PANEL BY RT-PCR (FLU A&B, COVID) ARPGX2
Influenza A by PCR: NEGATIVE
Influenza B by PCR: NEGATIVE
SARS Coronavirus 2 by RT PCR: NEGATIVE

## 2022-01-24 LAB — I-STAT BETA HCG BLOOD, ED (MC, WL, AP ONLY): I-stat hCG, quantitative: <5 m[IU]/mL (ref <5)

## 2022-01-24 LAB — GLUCOSE, CAPILLARY: Glucose-Capillary: 345 mg/dL — ABNORMAL HIGH (ref 70–99)

## 2022-01-24 LAB — TSH: TSH: 3.109 u[IU]/mL (ref 0.350–4.500)

## 2022-01-24 LAB — CBG MONITORING, ED
Glucose-Capillary: 393 mg/dL — ABNORMAL HIGH (ref 70–99)
Glucose-Capillary: 432 mg/dL — ABNORMAL HIGH (ref 70–99)

## 2022-01-24 MED ORDER — SODIUM CHLORIDE 0.9 % IV SOLN
250.0000 mL | INTRAVENOUS | Status: DC | PRN
  Administered 2022-01-25: 250 mL via INTRAVENOUS

## 2022-01-24 MED ORDER — PIPERACILLIN-TAZOBACTAM IN DEX 2-0.25 GM/50ML IV SOLN
2.2500 g | Freq: Three times a day (TID) | INTRAVENOUS | Status: DC
  Administered 2022-01-25 – 2022-01-26 (×5): 2.25 g via INTRAVENOUS
  Filled 2022-01-24 (×7): qty 50

## 2022-01-24 MED ORDER — ACETAMINOPHEN 650 MG RE SUPP: 650.0000 mg | Freq: Four times a day (QID) | RECTAL | Status: DC | PRN

## 2022-01-24 MED ORDER — ACETAMINOPHEN 325 MG PO TABS: 650.0000 mg | ORAL_TABLET | Freq: Four times a day (QID) | ORAL | Status: DC | PRN

## 2022-01-24 MED ORDER — INSULIN ASPART 100 UNIT/ML IJ SOLN
0.0000 [IU] | Freq: Three times a day (TID) | INTRAMUSCULAR | Status: DC
  Administered 2022-01-25: 1 [IU] via SUBCUTANEOUS
  Administered 2022-01-25 – 2022-01-26 (×2): 2 [IU] via SUBCUTANEOUS
  Administered 2022-01-26: 4 [IU] via SUBCUTANEOUS

## 2022-01-24 MED ORDER — INSULIN GLARGINE-YFGN 100 UNIT/ML ~~LOC~~ SOLN
5.0000 [IU] | Freq: Every day | SUBCUTANEOUS | Status: DC
  Administered 2022-01-24 – 2022-01-26 (×2): 5 [IU] via SUBCUTANEOUS
  Filled 2022-01-24 (×5): qty 0.05

## 2022-01-24 MED ORDER — LEVOTHYROXINE SODIUM 50 MCG PO TABS
50.0000 ug | ORAL_TABLET | Freq: Every day | ORAL | Status: DC
  Administered 2022-01-26: 50 ug via ORAL
  Filled 2022-01-24: qty 1

## 2022-01-24 MED ORDER — VANCOMYCIN HCL IN DEXTROSE 1-5 GM/200ML-% IV SOLN
1000.0000 mg | Freq: Once | INTRAVENOUS | Status: AC
  Administered 2022-01-24: 1000 mg via INTRAVENOUS
  Filled 2022-01-24: qty 200

## 2022-01-24 MED ORDER — PIPERACILLIN-TAZOBACTAM 3.375 G IVPB 30 MIN
3.3750 g | Freq: Once | INTRAVENOUS | Status: AC
  Administered 2022-01-24: 3.375 g via INTRAVENOUS
  Filled 2022-01-24: qty 50

## 2022-01-24 MED ORDER — ATORVASTATIN CALCIUM 10 MG PO TABS
10.0000 mg | ORAL_TABLET | Freq: Every day | ORAL | Status: DC
  Administered 2022-01-24 – 2022-01-25 (×2): 10 mg via ORAL
  Filled 2022-01-24 (×2): qty 1

## 2022-01-24 MED ORDER — TORSEMIDE 20 MG PO TABS
150.0000 mg | ORAL_TABLET | Freq: Every day | ORAL | Status: DC
  Administered 2022-01-24 – 2022-01-26 (×2): 150 mg via ORAL
  Filled 2022-01-24 (×3): qty 8

## 2022-01-24 MED ORDER — INSULIN ASPART 100 UNIT/ML IJ SOLN
5.0000 [IU] | Freq: Once | INTRAMUSCULAR | Status: AC
  Administered 2022-01-24: 5 [IU] via SUBCUTANEOUS

## 2022-01-24 MED ORDER — PIPERACILLIN-TAZOBACTAM IN DEX 2-0.25 GM/50ML IV SOLN
2.2500 g | Freq: Three times a day (TID) | INTRAVENOUS | Status: DC
  Filled 2022-01-24 (×2): qty 50

## 2022-01-24 MED ORDER — INSULIN GLARGINE-YFGN 100 UNIT/ML ~~LOC~~ SOLN
8.0000 [IU] | Freq: Every day | SUBCUTANEOUS | Status: DC
  Filled 2022-01-24: qty 0.08

## 2022-01-24 MED ORDER — FERRIC CITRATE 1 GM 210 MG(FE) PO TABS
420.0000 mg | ORAL_TABLET | Freq: Two times a day (BID) | ORAL | Status: DC
Start: 2022-01-25 — End: 2022-01-26
  Administered 2022-01-25: 420 mg via ORAL
  Filled 2022-01-24 (×3): qty 2

## 2022-01-24 MED ORDER — SODIUM CHLORIDE 0.9% FLUSH
3.0000 mL | Freq: Two times a day (BID) | INTRAVENOUS | Status: DC
  Administered 2022-01-24 – 2022-01-26 (×4): 3 mL via INTRAVENOUS

## 2022-01-24 MED ORDER — OXYCODONE HCL 5 MG PO TABS: 5.0000 mg | ORAL_TABLET | ORAL | Status: DC | PRN

## 2022-01-24 MED ORDER — ASPIRIN EC 81 MG PO TBEC
81.0000 mg | DELAYED_RELEASE_TABLET | Freq: Every day | ORAL | Status: DC
  Administered 2022-01-26: 81 mg via ORAL
  Filled 2022-01-24 (×2): qty 1

## 2022-01-24 MED ORDER — SODIUM CHLORIDE 0.9% FLUSH: 3.0000 mL | INTRAVENOUS | Status: DC | PRN

## 2022-01-24 MED ORDER — PANTOPRAZOLE SODIUM 40 MG PO TBEC
40.0000 mg | DELAYED_RELEASE_TABLET | Freq: Two times a day (BID) | ORAL | Status: DC
Start: 2022-01-24 — End: 2022-01-26
  Administered 2022-01-24 – 2022-01-26 (×4): 40 mg via ORAL
  Filled 2022-01-24 (×4): qty 1

## 2022-01-24 MED ORDER — VANCOMYCIN HCL 750 MG/150ML IV SOLN
750.0000 mg | Freq: Once | INTRAVENOUS | Status: DC
  Filled 2022-01-24: qty 150

## 2022-01-24 MED ORDER — LACTULOSE 10 GM/15ML PO SOLN: 10.0000 g | Freq: Every day | ORAL | Status: DC | PRN

## 2022-01-24 MED ORDER — VITAMIN D (ERGOCALCIFEROL) 1.25 MG (50000 UNIT) PO CAPS
50000.0000 [IU] | ORAL_CAPSULE | ORAL | Status: DC
  Administered 2022-01-24: 50000 [IU] via ORAL
  Filled 2022-01-24: qty 1

## 2022-01-24 NOTE — Assessment & Plan Note (Addendum)
-  She has increased AG, but likely secondary to renal disease as her AG has been around 16 ?-BS 395, check vbg/beta hydroxybutyrate acid with foot ulcer to make sure not mild DKA ?-Will give novolog now (has hx of labile BS) ?-SSI and lantus have been ordered.  ?-Her anion gap closed and CO2 was 26, chloride level was 99 and anion gap was 11 ?-Follow-up within 1 to 2 weeks ?

## 2022-01-24 NOTE — Consult Note (Signed)
Hospital Consult    Reason for Consult: Right lateral foot wound Referring Physician: Dr. [name] MRN #:  [id number]  History of Present Illness:  51 y.o. female with a history of calciphylaxis of both lower extremities being treated with Dr. [name].  She has previously undergone angiography with Dr. Oneida Alar there was no intervention undertaken.  She followed up with Dr. [name] today and was noted to have an ulcer over the base of her fifth metatarsal on the right.  She was subsequently referred to the emergency department.  She is on dialysis via left upper extremity fistula.  She does not take any blood thinners.  She is on aspirin and a statin.  She is in a wheelchair, she has not been able to walk due to left knee pain but she is able to stand and transfer  Past Medical History:  Diagnosis Date   Anemia    Blindness of right eye with low vision in contralateral eye    s/p victrectomy   Diabetes mellitus, type II ([place])    Dyslipidemia    Glaucoma    History of blood transfusion    Hypertension    Hypothyroidism (acquired)    Kidney disease    Stage 5   Pneumonia     Past Surgical History:  Procedure Laterality Date   ABDOMINAL AORTOGRAM W/LOWER EXTREMITY Bilateral 12/18/2020   Procedure: ABDOMINAL AORTOGRAM W/LOWER EXTREMITY;  Surgeon: Elam Dutch, MD;  Location: [place];  Service: Cardiovascular;  Laterality: Bilateral;   ANKLE FRACTURE SURGERY Right    AV FISTULA PLACEMENT Left 08/18/2020   Procedure: LEFT ARM BRACHIOCEPHALIC ARTERIOVENOUS (AV) FISTULA CREATION;  Surgeon: Elam Dutch, MD;  Location: [place];  Service: Vascular;  Laterality: Left;   BIOPSY  04/24/2021   Procedure: BIOPSY;  Surgeon: Eloise Harman, DO;  [facility];  Service: Endoscopy;;   CESAREAN SECTION     CHOLECYSTECTOMY     COLONOSCOPY  04/24/2021   Surgeon: Eloise Harman, DO;  nonbleeding internal hemorrhoids, 1 large (25 mm) pedunculated transverse colon polyp  (prolapse type polyp) with adherent clot and stigmata of recent bleed.   COLONOSCOPY WITH PROPOFOL N/A 05/14/2021   Procedure: COLONOSCOPY WITH PROPOFOL;  Surgeon: Daneil Dolin, MD;  [facility];  Service: Endoscopy;  Laterality: N/A;   ESOPHAGOGASTRODUODENOSCOPY (EGD) WITH PROPOFOL N/A 04/24/2021   Surgeon: Eloise Harman, DO;  duodenal erosions and gastritis biopsied (pathology with peptic duodenitis, reactive gastropathy with erosions/chronic inflammation, negative for H. pylori)   EYE SURGERY     Vatrectomy   HEMOSTASIS CLIP PLACEMENT  05/14/2021   Procedure: HEMOSTASIS CLIP PLACEMENT;  Surgeon: Daneil Dolin, MD;  [facility];  Service: Endoscopy;;   IR PERC TUN PERIT CATH WO PORT S&I /IMAG  09/15/2020   IR REMOVAL TUN CV CATH W/O FL  02/19/2021   IR US GUIDE VASC ACCESS RIGHT  09/15/2020   POLYPECTOMY  04/24/2021   Procedure: POLYPECTOMY;  Surgeon: Eloise Harman, DO;  [facility];  Service: Endoscopy;;   POLYPECTOMY  05/14/2021   Procedure: POLYPECTOMY;  Surgeon: Daneil Dolin, MD;  [facility];  Service: Endoscopy;;   SKIN SPLIT GRAFT Bilateral 09/03/2021   Procedure: SKIN GRAFT BILATERAL LEGS;  Surgeon: Newt Minion, MD;  Location: [place] [facility];  Service: Orthopedics;  Laterality: Bilateral;   SKIN SPLIT GRAFT Left 12/10/2021   Procedure: IRRIGATION AND DEBRIDEMENT LEFT CALF, APPLICATION SPLIT THICKNESS SKIN GRAFT;  Surgeon: Newt Minion, MD;  Location: [place];  Service: Orthopedics;  Laterality: Left;   TOE SURGERY      Allergies  Allergen Reactions   Ace Inhibitors Cough    Prior to Admission medications   Medication Sig Start Date End Date Taking? Authorizing Provider  acetaminophen (TYLENOL) 500 MG tablet Take 1,000 mg by mouth every 6 (six) hours as needed for moderate pain.    [provider]  aspirin EC 81 MG tablet Take 1 tablet (81 mg total) by mouth daily with breakfast. 05/19/21 05/19/22  Johnson,  Clanford L, MD  atorvastatin (LIPITOR) 10 MG tablet Take 1 tablet (10 mg total) by mouth daily. Patient taking differently: Take 10 mg by mouth at bedtime. 04/25/21   Roxan Hockey, MD  HUMALOG KWIKPEN 100 UNIT/ML KwikPen Inject 2 Units into the skin 3 (three) times daily with meals. If eats 50% or more of meal. Patient taking differently: Inject 5-15 Units into the skin 3 (three) times daily with meals. Sliding Scale If eats 50% or more of meal. 05/15/21   Johnson, Clanford L, MD  insulin glargine (LANTUS) 100 UNIT/ML injection Inject 0.08 mLs (8 Units total) into the skin at bedtime. 08/11/21   Elodia Florence., MD  lactulose [facility]) 10 GM/15ML solution Take 10 g by mouth daily as needed for moderate constipation. 11/30/21   [provider]  levothyroxine (SYNTHROID) 50 MCG tablet Take 1 tablet (50 mcg total) by mouth daily before breakfast. 04/25/21   Roxan Hockey, MD  oxyCODONE-acetaminophen (PERCOCET/ROXICET) 5-325 MG tablet Take 1 tablet by mouth every 4 (four) hours as needed. 12/11/21   Newt Minion, MD  pantoprazole (PROTONIX) 40 MG tablet Take 1 tablet (40 mg total) by mouth 2 (two) times daily. 04/25/21 04/25/22  Roxan Hockey, MD  sevelamer carbonate (RENVELA) 800 MG tablet Take 1 tablet (800 mg total) by mouth 3 (three) times daily with meals. Patient taking differently: Take 1,600 mg by mouth 3 (three) times daily with meals. 04/25/21   Roxan Hockey, MD  torsemide (DEMADEX) 100 MG tablet Take 150 mg by mouth daily.  05/22/20   [provider]  Vitamin D, Ergocalciferol, (DRISDOL) 1.25 MG (50000 UNIT) CAPS capsule Take 50,000 Units by mouth every 14 (fourteen) days. 04/20/21   [provider]    Social History   Socioeconomic History   Marital status: Single    Spouse name: Not on file   Number of children: Not on file   Years of education: Not on file   Highest education level: Not on file  Occupational History   Not on file  Tobacco Use    Smoking status: Never   Smokeless tobacco: Never  Vaping Use   Vaping Use: Never used  Substance and Sexual Activity   Alcohol use: No   Drug use: No   Sexual activity: Yes    Birth control/protection: Condom  Other Topics Concern   Not on file  Social History Narrative   Not on file   Social Determinants of Health   Financial Resource Strain: Not on file  Food Insecurity: Not on file  Transportation Needs: Not on file  Physical Activity: Not on file  Stress: Not on file  Social Connections: Not on file  Intimate Partner Violence: Not on file     Family History  Problem Relation Age of Onset   Heart disease Mother    Diabetes Mother    Kidney disease Mother    Diabetes Father  Heart disease Father    Diabetes Brother    Colon cancer Neg Hx     Review of Systems  Constitutional: Negative.   HENT: Negative.    Eyes: Negative.   Respiratory: Negative.    Cardiovascular:  Positive for leg swelling.  Gastrointestinal: Negative.   Musculoskeletal:  Positive for joint pain.  Skin: Negative.   Neurological: Negative.   Endo/Heme/Allergies: Negative.   Psychiatric/Behavioral: Negative.       Physical Examination  Vitals:   01/24/22 1358  BP: (!) 146/82  Pulse: 74  Resp: 18  Temp: 98.5 F (36.9 C)  SpO2: 98%   Body mass index is 31.31 kg/m.  Physical Exam HENT:     Head: Normocephalic.     Nose: Nose normal.  Eyes:     Pupils: Pupils are equal, round, and reactive to light.  Cardiovascular:     Rate and Rhythm: Normal rate.     Pulses:          Radial pulses are 2+ on the right side and 2+ on the left side.     Comments: I cannot reliably feel femoral or popliteal pulses but this may be secondary to edema and body habitus Pulmonary:     Effort: Pulmonary effort is normal.  Abdominal:     General: Abdomen is flat.     Palpations: Abdomen is soft.  Musculoskeletal:     Comments: There are dressings on the bilateral lower extremities with  compressive wraps, there is a wound on the right lateral aspect of the foot that I was able to visualize there is no foul smell  Neurological:     General: No focal deficit present.     Mental Status: She is alert.     CBC    Component Value Date/Time   WBC 8.8 12/12/2021 0910   RBC 4.30 12/12/2021 0910   HGB 11.7 (L) 12/12/2021 0910   HCT 38.1 12/12/2021 0910   PLT 178 12/12/2021 0910   MCV 88.6 12/12/2021 0910   MCH 27.2 12/12/2021 0910   MCHC 30.7 12/12/2021 0910   RDW 15.0 12/12/2021 0910   LYMPHSABS 1.1 08/06/2021 1710   MONOABS 1.4 (H) 08/06/2021 1710   EOSABS 0.2 08/06/2021 1710   BASOSABS 0.1 08/06/2021 1710    BMET    Component Value Date/Time   NA 133 (L) 12/12/2021 0910   K 5.7 (H) 12/12/2021 0910   CL 93 (L) 12/12/2021 0910   CO2 24 12/12/2021 0910   GLUCOSE 323 (H) 12/12/2021 0910   BUN 68 (H) 12/12/2021 0910   CREATININE 6.39 (H) 12/12/2021 0910   CALCIUM 8.9 12/12/2021 0910   GFRNONAA 8 (L) 12/12/2021 0910   GFRAA 10 (L) 06/11/2020 0336    COAGS: Lab Results  Component Value Date   INR 1.2 04/23/2021   INR 1.1 04/22/2021     Non-Invasive Vascular Imaging:   ABIs ordered   ASSESSMENT/PLAN: This is a 51 y.o. female with new wound over the base of her right fifth metatarsal.  I have ordered ABIs.  She will can continue aspirin and statin.  Plan will be for angiography from a left common femoral approach tomorrow in the Cath Lab with Dr. Trula Slade.  I have discussed this with the patient and she demonstrates good understanding she will be n.p.o. past midnight.  Felisia Balcom C. Donzetta Matters, MD [facility] Office: [phone] Pager: [phone]

## 2022-01-24 NOTE — Assessment & Plan Note (Addendum)
-  a1c uncontrolled, a1c of 12.2 in 11/2021  ?Continue lantus 8 units qhs, likely needs titrated (decreased to 5 units since NPO at midnight) ?SSI and accuchecks per protocol  ?-Resume home medications and appreciate diabetes education coordinator recommendation ?-CBGs here ranging from 142-311 ? ?

## 2022-01-24 NOTE — Assessment & Plan Note (Addendum)
Continue lipitor  ?

## 2022-01-24 NOTE — Assessment & Plan Note (Addendum)
-  Blood pressure very well controlled ?On no medication, monitor  ?

## 2022-01-24 NOTE — Assessment & Plan Note (Addendum)
Wound care consult and continue outpatient follow-up ?

## 2022-01-24 NOTE — H&P (View-Only) (Signed)
Hospital Consult    Reason for Consult: Right lateral foot wound Referring Physician: Dr. [name] MRN #:  [id number]  History of Present Illness:  51 y.o. female with a history of calciphylaxis of both lower extremities being treated with Dr. [name].  She has previously undergone angiography with Dr. Oneida Alar there was no intervention undertaken.  She followed up with Dr. [name] today and was noted to have an ulcer over the base of her fifth metatarsal on the right.  She was subsequently referred to the emergency department.  She is on dialysis via left upper extremity fistula.  She does not take any blood thinners.  She is on aspirin and a statin.  She is in a wheelchair, she has not been able to walk due to left knee pain but she is able to stand and transfer  Past Medical History:  Diagnosis Date   Anemia    Blindness of right eye with low vision in contralateral eye    s/p victrectomy   Diabetes mellitus, type II ([place])    Dyslipidemia    Glaucoma    History of blood transfusion    Hypertension    Hypothyroidism (acquired)    Kidney disease    Stage 5   Pneumonia     Past Surgical History:  Procedure Laterality Date   ABDOMINAL AORTOGRAM W/LOWER EXTREMITY Bilateral 12/18/2020   Procedure: ABDOMINAL AORTOGRAM W/LOWER EXTREMITY;  Surgeon: Elam Dutch, MD;  Location: [facility];  Service: Cardiovascular;  Laterality: Bilateral;   ANKLE FRACTURE SURGERY Right    AV FISTULA PLACEMENT Left 08/18/2020   Procedure: LEFT ARM BRACHIOCEPHALIC ARTERIOVENOUS (AV) FISTULA CREATION;  Surgeon: Elam Dutch, MD;  Location: [place];  Service: Vascular;  Laterality: Left;   BIOPSY  04/24/2021   Procedure: BIOPSY;  Surgeon: Eloise Harman, DO;  [facility];  Service: Endoscopy;;   CESAREAN SECTION     CHOLECYSTECTOMY     COLONOSCOPY  04/24/2021   Surgeon: Eloise Harman, DO;  nonbleeding internal hemorrhoids, 1 large (25 mm) pedunculated transverse colon polyp  (prolapse type polyp) with adherent clot and stigmata of recent bleed.   COLONOSCOPY WITH PROPOFOL N/A 05/14/2021   Procedure: COLONOSCOPY WITH PROPOFOL;  Surgeon: Daneil Dolin, MD;  [facility];  Service: Endoscopy;  Laterality: N/A;   ESOPHAGOGASTRODUODENOSCOPY (EGD) WITH PROPOFOL N/A 04/24/2021   Surgeon: Eloise Harman, DO;  duodenal erosions and gastritis biopsied (pathology with peptic duodenitis, reactive gastropathy with erosions/chronic inflammation, negative for H. pylori)   EYE SURGERY     Vatrectomy   HEMOSTASIS CLIP PLACEMENT  05/14/2021   Procedure: HEMOSTASIS CLIP PLACEMENT;  Surgeon: Daneil Dolin, MD;  [facility];  Service: Endoscopy;;   IR PERC TUN PERIT CATH WO PORT S&I /IMAG  09/15/2020   IR REMOVAL TUN CV CATH W/O FL  02/19/2021   IR US GUIDE VASC ACCESS RIGHT  09/15/2020   POLYPECTOMY  04/24/2021   Procedure: POLYPECTOMY;  Surgeon: Eloise Harman, DO;  [facility];  Service: Endoscopy;;   POLYPECTOMY  05/14/2021   Procedure: POLYPECTOMY;  Surgeon: Daneil Dolin, MD;  [facility];  Service: Endoscopy;;   SKIN SPLIT GRAFT Bilateral 09/03/2021   Procedure: SKIN GRAFT BILATERAL LEGS;  Surgeon: Newt Minion, MD;  Location: [place];  Service: Orthopedics;  Laterality: Bilateral;   SKIN SPLIT GRAFT Left 12/10/2021   Procedure: IRRIGATION AND DEBRIDEMENT LEFT CALF, APPLICATION SPLIT THICKNESS SKIN GRAFT;  Surgeon: Newt Minion, MD;  Location: [place];  Service: Orthopedics;  Laterality: Left;   TOE SURGERY      Allergies  Allergen Reactions   Ace Inhibitors Cough    Prior to Admission medications   Medication Sig Start Date End Date Taking? Authorizing Provider  acetaminophen (TYLENOL) 500 MG tablet Take 1,000 mg by mouth every 6 (six) hours as needed for moderate pain.    [provider]  aspirin EC 81 MG tablet Take 1 tablet (81 mg total) by mouth daily with breakfast. 05/19/21 05/19/22  Johnson,  Clanford L, MD  atorvastatin (LIPITOR) 10 MG tablet Take 1 tablet (10 mg total) by mouth daily. Patient taking differently: Take 10 mg by mouth at bedtime. 04/25/21   Roxan Hockey, MD  HUMALOG KWIKPEN 100 UNIT/ML KwikPen Inject 2 Units into the skin 3 (three) times daily with meals. If eats 50% or more of meal. Patient taking differently: Inject 5-15 Units into the skin 3 (three) times daily with meals. Sliding Scale If eats 50% or more of meal. 05/15/21   Johnson, Clanford L, MD  insulin glargine (LANTUS) 100 UNIT/ML injection Inject 0.08 mLs (8 Units total) into the skin at bedtime. 08/11/21   Elodia Florence., MD  lactulose [facility]) 10 GM/15ML solution Take 10 g by mouth daily as needed for moderate constipation. 11/30/21   [provider]  levothyroxine (SYNTHROID) 50 MCG tablet Take 1 tablet (50 mcg total) by mouth daily before breakfast. 04/25/21   Roxan Hockey, MD  oxyCODONE-acetaminophen (PERCOCET/ROXICET) 5-325 MG tablet Take 1 tablet by mouth every 4 (four) hours as needed. 12/11/21   Newt Minion, MD  pantoprazole (PROTONIX) 40 MG tablet Take 1 tablet (40 mg total) by mouth 2 (two) times daily. 04/25/21 04/25/22  Roxan Hockey, MD  sevelamer carbonate (RENVELA) 800 MG tablet Take 1 tablet (800 mg total) by mouth 3 (three) times daily with meals. Patient taking differently: Take 1,600 mg by mouth 3 (three) times daily with meals. 04/25/21   Roxan Hockey, MD  torsemide (DEMADEX) 100 MG tablet Take 150 mg by mouth daily.  05/22/20   [provider]  Vitamin D, Ergocalciferol, (DRISDOL) 1.25 MG (50000 UNIT) CAPS capsule Take 50,000 Units by mouth every 14 (fourteen) days. 04/20/21   [provider]    Social History   Socioeconomic History   Marital status: Single    Spouse name: Not on file   Number of children: Not on file   Years of education: Not on file   Highest education level: Not on file  Occupational History   Not on file  Tobacco Use    Smoking status: Never   Smokeless tobacco: Never  Vaping Use   Vaping Use: Never used  Substance and Sexual Activity   Alcohol use: No   Drug use: No   Sexual activity: Yes    Birth control/protection: Condom  Other Topics Concern   Not on file  Social History Narrative   Not on file   Social Determinants of Health   Financial Resource Strain: Not on file  Food Insecurity: Not on file  Transportation Needs: Not on file  Physical Activity: Not on file  Stress: Not on file  Social Connections: Not on file  Intimate Partner Violence: Not on file     Family History  Problem Relation Age of Onset   Heart disease Mother    Diabetes Mother    Kidney disease Mother    Diabetes Father  Heart disease Father    Diabetes Brother    Colon cancer Neg Hx     Review of Systems  Constitutional: Negative.   HENT: Negative.    Eyes: Negative.   Respiratory: Negative.    Cardiovascular:  Positive for leg swelling.  Gastrointestinal: Negative.   Musculoskeletal:  Positive for joint pain.  Skin: Negative.   Neurological: Negative.   Endo/Heme/Allergies: Negative.   Psychiatric/Behavioral: Negative.       Physical Examination  Vitals:   01/24/22 1358  BP: (!) 146/82  Pulse: 74  Resp: 18  Temp: 98.5 F (36.9 C)  SpO2: 98%   Body mass index is 31.31 kg/m.  Physical Exam HENT:     Head: Normocephalic.     Nose: Nose normal.  Eyes:     Pupils: Pupils are equal, round, and reactive to light.  Cardiovascular:     Rate and Rhythm: Normal rate.     Pulses:          Radial pulses are 2+ on the right side and 2+ on the left side.     Comments: I cannot reliably feel femoral or popliteal pulses but this may be secondary to edema and body habitus Pulmonary:     Effort: Pulmonary effort is normal.  Abdominal:     General: Abdomen is flat.     Palpations: Abdomen is soft.  Musculoskeletal:     Comments: There are dressings on the bilateral lower extremities with  compressive wraps, there is a wound on the right lateral aspect of the foot that I was able to visualize there is no foul smell  Neurological:     General: No focal deficit present.     Mental Status: She is alert.     CBC    Component Value Date/Time   WBC 8.8 12/12/2021 0910   RBC 4.30 12/12/2021 0910   HGB 11.7 (L) 12/12/2021 0910   HCT 38.1 12/12/2021 0910   PLT 178 12/12/2021 0910   MCV 88.6 12/12/2021 0910   MCH 27.2 12/12/2021 0910   MCHC 30.7 12/12/2021 0910   RDW 15.0 12/12/2021 0910   LYMPHSABS 1.1 08/06/2021 1710   MONOABS 1.4 (H) 08/06/2021 1710   EOSABS 0.2 08/06/2021 1710   BASOSABS 0.1 08/06/2021 1710    BMET    Component Value Date/Time   NA 133 (L) 12/12/2021 0910   K 5.7 (H) 12/12/2021 0910   CL 93 (L) 12/12/2021 0910   CO2 24 12/12/2021 0910   GLUCOSE 323 (H) 12/12/2021 0910   BUN 68 (H) 12/12/2021 0910   CREATININE 6.39 (H) 12/12/2021 0910   CALCIUM 8.9 12/12/2021 0910   GFRNONAA 8 (L) 12/12/2021 0910   GFRAA 10 (L) 06/11/2020 0336    COAGS: Lab Results  Component Value Date   INR 1.2 04/23/2021   INR 1.1 04/22/2021     Non-Invasive Vascular Imaging:   ABIs ordered   ASSESSMENT/PLAN: This is a 51 y.o. female with new wound over the base of her right fifth metatarsal.  I have ordered ABIs.  She will can continue aspirin and statin.  Plan will be for angiography from a left common femoral approach tomorrow in the Cath Lab with Dr. Trula Slade.  I have discussed this with the patient and she demonstrates good understanding she will be n.p.o. past midnight.  Josselin Gaulin C. Donzetta Matters, MD [facility] Office: [phone] Pager: [phone]

## 2022-01-24 NOTE — Assessment & Plan Note (Addendum)
-  Dialysis TTS and had full session on Saturday ?-Nephrology consulted for routine dialysis and she was dialyzed 01/26/2022 with next planned dialysis on 01/28/2022 ?No signs of volume overload or need for emergent dialysis  ?-Continue auryxia and torsemide care per nephrology ?

## 2022-01-24 NOTE — Assessment & Plan Note (Addendum)
-  Wound care consult for bilateral calves. Currently being treated by dr. [name] we will continue following him with him in the outpatient setting ?

## 2022-01-24 NOTE — ED Provider Notes (Signed)
?[facility] ?Provider Note ? ? ?CSN: [id number] ?Arrival date & time: 01/24/22  1334 ? ?  ? ?History ? ?Chief Complaint  ?Patient presents with  ? Foot Wound  ? ? ?Amy Moses is a 51 y.o. female history of ESRD on dialysis, diabetes, right foot wound, calciphylaxis here presenting with concern for worsening right foot ulcer.  Patient has been managed by Dr. [name] for her chronic right foot wound.  Patient saw Dr. [name] today was noted to have a 5 x 5 cm necrotic wound on the right foot.  She was sent in for evaluation by vascular surgeon.  Vascular surgeon, Dr. Donzetta Matters saw patient in triage and recommended an angiogram and admission.  Patient is a dialysis patient and last dialyzed 2 days ago ? ?The history is provided by the patient.  ? ?  ? ?Home Medications ?Prior to Admission medications   ?Medication Sig Start Date End Date Taking? Authorizing Provider  ?acetaminophen (TYLENOL) 500 MG tablet Take 1,000 mg by mouth every 6 (six) hours as needed for moderate pain.    [provider]  ?aspirin EC 81 MG tablet Take 1 tablet (81 mg total) by mouth daily with breakfast. 05/19/21 05/19/22  Irwin Brakeman L, MD  ?atorvastatin (LIPITOR) 10 MG tablet Take 1 tablet (10 mg total) by mouth daily. ?Patient taking differently: Take 10 mg by mouth at bedtime. 04/25/21   Roxan Hockey, MD  ?Cleda Clarks 100 UNIT/ML KwikPen Inject 2 Units into the skin 3 (three) times daily with meals. If eats 50% or more of meal. ?Patient taking differently: Inject 5-15 Units into the skin 3 (three) times daily with meals. Sliding Scale ?If eats 50% or more of meal. 05/15/21   Johnson, Clanford L, MD  ?insulin glargine (LANTUS) 100 UNIT/ML injection Inject 0.08 mLs (8 Units total) into the skin at bedtime. 08/11/21   Elodia Florence., MD  ?lactulose [facility]) 10 GM/15ML solution Take 10 g by mouth daily as needed for moderate constipation. 11/30/21   [provider]   ?levothyroxine (SYNTHROID) 50 MCG tablet Take 1 tablet (50 mcg total) by mouth daily before breakfast. 04/25/21   Roxan Hockey, MD  ?oxyCODONE-acetaminophen (PERCOCET/ROXICET) 5-325 MG tablet Take 1 tablet by mouth every 4 (four) hours as needed. 12/11/21   Newt Minion, MD  ?pantoprazole (PROTONIX) 40 MG tablet Take 1 tablet (40 mg total) by mouth 2 (two) times daily. 04/25/21 04/25/22  Roxan Hockey, MD  ?sevelamer carbonate (RENVELA) 800 MG tablet Take 1 tablet (800 mg total) by mouth 3 (three) times daily with meals. ?Patient taking differently: Take 1,600 mg by mouth 3 (three) times daily with meals. 04/25/21   Roxan Hockey, MD  ?torsemide (DEMADEX) 100 MG tablet Take 150 mg by mouth daily.  05/22/20   [provider]  ?Vitamin D, Ergocalciferol, (DRISDOL) 1.25 MG (50000 UNIT) CAPS capsule Take 50,000 Units by mouth every 14 (fourteen) days. 04/20/21   [provider]  ?   ? ?Allergies    ?Ace inhibitors   ? ?Review of Systems   ?Review of Systems  ?Musculoskeletal:   ?     Right foot pain  ?Skin:  Positive for wound.  ?All other systems reviewed and are negative. ? ?Physical Exam ?Updated Vital Signs ?BP 120/79   Pulse 72   Temp 98.5 ?F (36.9 ?C)   Resp 18   Ht 5\' 6"  (1.676 m)   Wt 88 kg   LMP 08/22/2015 (Approximate)  SpO2 100%   BMI 31.31 kg/m?  ?Physical Exam ?Vitals and nursing note reviewed.  ?Constitutional:   ?   Comments: Chronically ill  ?HENT:  ?   Head: Normocephalic.  ?   Mouth/Throat:  ?   Mouth: Mucous membranes are moist.  ?Eyes:  ?   Extraocular Movements: Extraocular movements intact.  ?   Pupils: Pupils are equal, round, and reactive to light.  ?Cardiovascular:  ?   Rate and Rhythm: Normal rate and regular rhythm.  ?   Pulses: Normal pulses.  ?Pulmonary:  ?   Effort: Pulmonary effort is normal.  ?Abdominal:  ?   General: Abdomen is flat.  ?Musculoskeletal:  ?   Cervical back: Normal range of motion and neck supple.  ?   Comments: Right foot with necrotic wound on  the lateral aspect.  Patient has poor DP pulse.  Please see picture from earlier today from Dr. [name]  ?Skin: ?   General: Skin is warm.  ?   Capillary Refill: Capillary refill takes less than 2 seconds.  ?Neurological:  ?   General: No focal deficit present.  ?   Mental Status: She is alert and oriented to person, place, and time.  ?Psychiatric:     ?   Mood and Affect: Mood normal.     ?   Behavior: Behavior normal.  ? ? ? ?ED Results / Procedures / Treatments   ?Labs ?(all labs ordered are listed, but only abnormal results are displayed) ?Labs Reviewed  ?I-STAT CHEM 8, ED - Abnormal; Notable for the following components:  ?    Result Value  ? Sodium 132 (*)   ? Chloride 96 (*)   ? BUN 48 (*)   ? Creatinine, Ser 5.80 (*)   ? Glucose, Bld 392 (*)   ? Calcium, Ion 1.11 (*)   ? All other components within normal limits  ?CBC WITH DIFFERENTIAL/PLATELET  ?COMPREHENSIVE METABOLIC PANEL  ?I-STAT BETA HCG BLOOD, ED ([facility], [facility], [facility] ONLY)  ? ? ?EKG ?None ? ?Radiology ?No results found. ? ?Procedures ?Procedures  ? ? ?Medications Ordered in ED ?Medications  ?piperacillin-tazobactam (ZOSYN) IVPB 3.375 g (3.375 g Intravenous New Bag/Given 01/24/22 1638)  ?vancomycin (VANCOCIN) IVPB 1000 mg/200 mL premix (1,000 mg Intravenous New Bag/Given 01/24/22 1637)  ? ? ?ED Course/ Medical Decision Making/ A&P ?  ?                        ?Medical Decision Making ?Amy Moses is a 51 y.o. female here presenting with right foot wound.  There is concern for possible vascular compromise.  Patient has very poor pulses.  Patient is also a dialysis patient and last dialyzed was 2 days ago.  Dr. Donzetta Matters from vascular surgery already saw patient.  He recommend n.p.o. after midnight and IV antibiotics and medicine admission and he will do angiogram tomorrow. ?  ?5:32 PM ?Labs showed baseline CKD with creatinine of 5.8.  Potassium is normal.  White blood cell count is 11.  Patient given IV antibiotics.  Patient will be admitted by medicine service and  vascular plans to do angiogram tomorrow.  ? ?Problems Addressed: ?ESRD (end stage renal disease) on dialysis [facility]): acute illness or injury ?Ulcer of right foot, unspecified ulcer stage [facility]): acute illness or injury ? ?Amount and/or Complexity of Data Reviewed ?External Data Reviewed: notes. ?Labs: ordered. Decision-making details documented in ED Course. ? ?Risk ?Prescription drug management. ?Decision regarding hospitalization. ? ?Final Clinical Impression(s) /  ED Diagnoses ?Final diagnoses:  ?None  ? ? ?Rx / DC Orders ?ED Discharge Orders   ? ? None  ? ?  ? ? ?  ?Drenda Freeze, MD ?01/24/22 1735 ? ?

## 2022-01-24 NOTE — ED Triage Notes (Signed)
Pt arrives POV for eval of R lateral foot wound that was noted at orthopedics by MD [name]. Pt goes weekly to have her legs wrapped and she was found to have a new ulcer to outside of R foot. Extensive hx of feet wounds. MD Note from [place]: Patient has a new ischemic ulcer over the base of the fifth metatarsal on the right.  Due to the acute ischemic change will need to refer back to vascular vein surgery for evaluation for possible arterial studies. ?

## 2022-01-24 NOTE — Assessment & Plan Note (Addendum)
-  TSH in 2021 was >6, repeat tsh was 3.109 ?-Continue synthroid for now repeat thyroid function test in outpatient setting in 4 to 6 weeks ?

## 2022-01-24 NOTE — ED Provider Triage Note (Signed)
Emergency Medicine Provider Triage Evaluation Note ? ?Amy Moses , a 51 y.o. female  was evaluated in triage.  Patient presents here on referral of her orthopedic doctor.  She has a hx of calciphylaxis of both lower extremities and has been ongoing wound care.  They check her every single week.  Today she is at her visit they noticed a nonhealing ulcer with necrosis of the right lower extremity.  Apparently this is located at the base of the right fifth metatarsal. She was sent here to be evaluated by vascular for admission and obtain appropriate imaging.  ? ?Dr. Jess Barters note:  ?Patient is a 51 year old woman who has been treated for calciphylaxis of both lower extremities.  Patient has been undergoing serial compression wraps and has shown healthy granulation tissue at the base of the wounds both lower extremities. ?  ?Patient underwent arteriogram of both lower extremities January 2022 and was felt to have adequate circulation to both lower extremities for wound healing. ?  ?  ?  ?Assessment & Plan: ?Visit Diagnoses:  ?1. Calciphylaxis of left lower extremity with nonhealing ulcer with necrosis of muscle ([place])   ?2. Calciphylaxis of right lower extremity with nonhealing ulcer, limited to breakdown of skin ([place])   ?3. PVD (peripheral vascular disease) ([place])   ?  ?  ?Plan: Patient has a new ischemic ulcer over the base of the fifth metatarsal on the right.  Due to the acute ischemic change will need to refer back to vascular vein surgery for evaluation for possible arterial studies. ? ?Review of Systems  ?Positive:  ?Negative:  ? ?Physical Exam  ?BP (!) 146/82   Pulse 74   Temp 98.5 ?F (36.9 ?C)   Resp 18   Ht 5\' 6"  (1.676 m)   Wt 88 kg   LMP 08/22/2015 (Approximate)   SpO2 98%   BMI 31.31 kg/m?  ?Gen:   Awake, no distress   ?Resp:  Normal effort  ?MSK:   Moves extremities without difficulty  ?Other:  Did not view ulcer as they were new dressings in place. Will defer for primary team in back ? ?Medical  Decision Making  ?Medically screening exam initiated at 2:05 PM.  Appropriate orders placed.  Amy Moses was informed that the remainder of the evaluation will be completed by another provider, this initial triage assessment does not replace that evaluation, and the importance of remaining in the ED until their evaluation is complete. ? ? ?  ?Adolphus Birchwood, PA-C ?01/24/22 1551 ? ?

## 2022-01-24 NOTE — ED Notes (Signed)
Pt has a foot ulcer on the right outer foot. The ulcer measures 5cmx5cm. The wound bedding is necrotic in the center, but the outer bedding is pink and small amount of bleeding. ?

## 2022-01-24 NOTE — Progress Notes (Signed)
Pharmacy Antibiotic Note ? ?Amy Moses is a 51 y.o. female admitted on 01/24/2022 with  wound infection .  Pharmacy has been consulted for vancomycin and zosyn dosing. ? ?Patient with a history of ESRD on dialysis, T2DM, HLD, HTN, hypothyroidism, anemia, blindness of right eye, calciphylaxis of BLE . Patient presenting with worsening foot ulcer. ? ?WBC 10.6; T 98.5 F ? ?Plan: ?Vancomycin 1g given in the ED - will given another 750mg  to complete vancomycin loading dose ?Zosyn 2.25g q8h ?Trend WBC, Fever, Renal function, & Clinical course ?F/u cultures, clinical course, WBC, fever ?De-escalate when able ?F/u Nephrology plan ? ?Height: 5\' 6"  (167.6 cm) ?Weight: 88 kg (194 lb 0.1 oz) ?IBW/kg (Calculated) : 59.3 ? ?Temp (24hrs), Avg:98.5 ?F (36.9 ?C), Min:98.5 ?F (36.9 ?C), Max:98.5 ?F (36.9 ?C) ? ?Recent Labs  ?Lab 01/24/22 ?1625 01/24/22 ?1636  ?WBC 10.6*  --   ?CREATININE 5.59* 5.80*  ?  ?Estimated Creatinine Clearance: 13.3 mL/min (A) (by C-G formula based on SCr of 5.8 mg/dL (H)).   ? ?Allergies  ?Allergen Reactions  ? Ace Inhibitors Cough  ? ? ?Antimicrobials this admission: ?vancomycin 3/6 >>  ?zosyn 3/6 >> ? ?Microbiology results: ?Pending ? ?Thank you for allowing pharmacy to be a part of this patient?s care. ? ?Lorelei Pont, PharmD, BCPS ?01/24/2022 6:21 PM ?ED Clinical Pharmacist -  [phone] ?  ?

## 2022-01-24 NOTE — Progress Notes (Signed)
Office Visit Note   Patient: Amy Moses           Date of Birth: 08/17/1971           MRN: [id number] Visit Date: 01/24/2022              Requested by: Emelda Fear, [place] [id number] PCP: Emelda Fear, DO  Chief Complaint  Patient presents with   Left Leg - Follow-up, Routine Post Op    12/10/2021 Kerecis graft    Right Leg - Follow-up      HPI: Patient is a 51 year old woman who has been treated for calciphylaxis of both lower extremities.  Patient has been undergoing serial compression wraps and has shown healthy granulation tissue at the base of the wounds both lower extremities.  Patient underwent arteriogram of both lower extremities January 2022 and was felt to have adequate circulation to both lower extremities for wound healing.    Assessment & Plan: Visit Diagnoses:  1. Calciphylaxis of left lower extremity with nonhealing ulcer with necrosis of muscle (HCC)   2. Calciphylaxis of right lower extremity with nonhealing ulcer, limited to breakdown of skin ([place])   3. PVD (peripheral vascular disease) ([place])     Plan: Patient has a new ischemic ulcer over the base of the fifth metatarsal on the right.  Due to the acute ischemic change will need to refer back to vascular vein surgery for evaluation for possible arterial studies.  Follow-Up Instructions: Return in about 1 week (around 01/31/2022).   Ortho Exam  Patient is alert, oriented, no adenopathy, well-dressed, normal affect, normal respiratory effort. Examination patient has an abrasion proximal posterior aspect of the right calf from pressure from the dressing this has healthy granulation tissue.  The wound beds in the calf bilaterally also have healthy granulation tissue with interval wound healing.  Of note patient has developed a new ischemic ulcer base of the fifth metatarsal right foot.  Imaging: No results found.    Labs: Lab Results  Component Value Date   HGBA1C  12.2 (H) 12/10/2021   HGBA1C 9.5 (H) 08/07/2021   HGBA1C 7.3 (H) 03/30/2021   ESRSEDRATE 40 (H) 08/07/2021   CRP 11.1 (H) 08/07/2021   REPTSTATUS 08/12/2021 FINAL 08/07/2021   GRAMSTAIN  07/08/2021    RARE WBC PRESENT,BOTH PMN AND MONONUCLEAR RARE GRAM POSITIVE RODS RARE GRAM POSITIVE COCCI IN PAIRS    CULT  08/07/2021    NO GROWTH 5 DAYS Performed at [facility], [place] [address] [place], [place] [address]    LABORGA CITROBACTER KOSERI 05/21/2021   LABORGA PROTEUS MIRABILIS 05/21/2021     Lab Results  Component Value Date   ALBUMIN 3.2 (L) 12/12/2021   ALBUMIN 2.7 (L) 09/04/2021   ALBUMIN 2.4 (L) 08/12/2021   PREALBUMIN 13.7 (L) 08/07/2021    Lab Results  Component Value Date   MG 2.1 05/14/2021   MG 2.8 (H) 05/11/2021   MG 2.3 04/23/2021   No results found for: VD25OH  Lab Results  Component Value Date   PREALBUMIN 13.7 (L) 08/07/2021   CBC EXTENDED Latest Ref Rng & Units 12/12/2021 12/10/2021 09/04/2021  WBC 4.0 - 10.5 K/uL 8.8 6.5 8.9  RBC 3.87 - 5.11 MIL/uL 4.30 4.60 3.49(L)  HGB 12.0 - 15.0 g/dL 11.7(L) 12.6 8.7(L)  HCT 36.0 - 46.0 % 38.1 41.1 29.8(L)  PLT 150 - 400 K/uL 178 180 276  NEUTROABS 1.7 - 7.7 K/uL - - -  LYMPHSABS  0.7 - 4.0 K/uL - - -     There is no height or weight on file to calculate BMI.  Orders:  No orders of the defined types were placed in this encounter.  No orders of the defined types were placed in this encounter.    Procedures: No procedures performed  Clinical Data: No additional findings.  ROS:  All other systems negative, except as noted in the HPI. Review of Systems  Objective: Vital Signs: LMP 08/22/2015 (Approximate)   Specialty Comments:  No specialty comments available.  PMFS History: Patient Active Problem List   Diagnosis Date Noted   Leg wound, left, sequela 12/10/2021   Open wound of left lower leg    Open leg wound 09/03/2021   Wound infection    Non-pressure chronic ulcer of right  calf limited to breakdown of skin ([place])    Calciphylaxis of right lower extremity with nonhealing ulcer, limited to breakdown of skin ([place])    Chronic ulcer of left thigh ([place]) 08/07/2021   Calciphylaxis of left lower extremity with nonhealing ulcer with necrosis of muscle ([place]) 08/07/2021   Lower GI bleed 05/12/2021   Blindness 05/10/2021   Shingles 05/10/2021   Acute GI bleeding 04/24/2021   Acute blood loss anemia 04/23/2021   GI bleed 04/22/2021   Fever 03/31/2021   Acute on chronic heart failure with preserved ejection fraction (HFpEF) ([place]) 03/30/2021   Pressure injury of skin 03/30/2021   End stage renal disease ([place]) 11/16/2020   Calciphylaxis 11/06/2020   Non-healing open wound of heel 11/03/2020   Diabetic foot infection ([place]) 11/01/2020   Decubitus ulcer, heel 11/01/2020   Closed nondisplaced fracture of left patella 08/27/1218   Metabolic acidosis 75/88/3254   Acute on chronic renal failure ([place]) 06/10/2020   Symptomatic anemia 06/10/2020   Acute pericardial effusion 06/10/2020   Other acute nonsuppurative otitis media, unspecified ear 11/29/2019   Chronic kidney disease, stage 4 (severe) ([place]) 03/05/2019   Skin ulcer, limited to breakdown of skin ([place]) 01/28/2019   Vitamin D deficiency 01/28/2019   Uncontrolled type 2 diabetes mellitus with chronic kidney disease, with long-term current use of insulin 09/21/2015   Hyperlipidemia 09/21/2015   Essential hypertension, benign 09/21/2015   Primary hypothyroidism 09/21/2015   Iris bomb 07/31/2012   Secondary angle-closure glaucoma 07/31/2012   Past Medical History:  Diagnosis Date   Anemia    Blindness of right eye with low vision in contralateral eye    s/p victrectomy   Diabetes mellitus, type II ([place])    Dyslipidemia    Glaucoma    History of blood transfusion    Hypertension    Hypothyroidism (acquired)    Kidney disease    Stage 5   Pneumonia     Family History   Problem Relation Age of Onset   Heart disease Mother    Diabetes Mother    Kidney disease Mother    Diabetes Father    Heart disease Father    Diabetes Brother    Colon cancer Neg Hx     Past Surgical History:  Procedure Laterality Date   ABDOMINAL AORTOGRAM W/LOWER EXTREMITY Bilateral 12/18/2020   Procedure: ABDOMINAL AORTOGRAM W/LOWER EXTREMITY;  Surgeon: Elam Dutch, MD;  Location: [facility];  Service: Cardiovascular;  Laterality: Bilateral;   ANKLE FRACTURE SURGERY Right    AV FISTULA PLACEMENT Left 08/18/2020   Procedure: LEFT ARM BRACHIOCEPHALIC ARTERIOVENOUS (AV) FISTULA CREATION;  Surgeon: Elam Dutch, MD;  Location: [facility];  Service:  Vascular;  Laterality: Left;   BIOPSY  04/24/2021   Procedure: BIOPSY;  Surgeon: Eloise Harman, DO;  [facility];  Service: Endoscopy;;   CESAREAN SECTION     CHOLECYSTECTOMY     COLONOSCOPY  04/24/2021   Surgeon: Eloise Harman, DO;  nonbleeding internal hemorrhoids, 1 large (25 mm) pedunculated transverse colon polyp (prolapse type polyp) with adherent clot and stigmata of recent bleed.   COLONOSCOPY WITH PROPOFOL N/A 05/14/2021   Procedure: COLONOSCOPY WITH PROPOFOL;  Surgeon: Daneil Dolin, MD;  [facility];  Service: Endoscopy;  Laterality: N/A;   ESOPHAGOGASTRODUODENOSCOPY (EGD) WITH PROPOFOL N/A 04/24/2021   Surgeon: Eloise Harman, DO;  duodenal erosions and gastritis biopsied (pathology with peptic duodenitis, reactive gastropathy with erosions/chronic inflammation, negative for H. pylori)   EYE SURGERY     Vatrectomy   HEMOSTASIS CLIP PLACEMENT  05/14/2021   Procedure: HEMOSTASIS CLIP PLACEMENT;  Surgeon: Daneil Dolin, MD;  [facility];  Service: Endoscopy;;   IR PERC TUN PERIT CATH WO PORT S&I /IMAG  09/15/2020   IR REMOVAL TUN CV CATH W/O FL  02/19/2021   IR US GUIDE VASC ACCESS RIGHT  09/15/2020   POLYPECTOMY  04/24/2021   Procedure:  POLYPECTOMY;  Surgeon: Eloise Harman, DO;  [facility];  Service: Endoscopy;;   POLYPECTOMY  05/14/2021   Procedure: POLYPECTOMY;  Surgeon: Daneil Dolin, MD;  [facility];  Service: Endoscopy;;   SKIN SPLIT GRAFT Bilateral 09/03/2021   Procedure: SKIN GRAFT BILATERAL LEGS;  Surgeon: Newt Minion, MD;  Location: [facility];  Service: Orthopedics;  Laterality: Bilateral;   SKIN SPLIT GRAFT Left 12/10/2021   Procedure: IRRIGATION AND DEBRIDEMENT LEFT CALF, APPLICATION SPLIT THICKNESS SKIN GRAFT;  Surgeon: Newt Minion, MD;  Location: [facility];  Service: Orthopedics;  Laterality: Left;   TOE SURGERY     Social History   Occupational History   Not on file  Tobacco Use   Smoking status: Never   Smokeless tobacco: Never  Vaping Use   Vaping Use: Never used  Substance and Sexual Activity   Alcohol use: No   Drug use: No   Sexual activity: Yes    Birth control/protection: Condom

## 2022-01-24 NOTE — Assessment & Plan Note (Addendum)
-  51 year old female with hx of calciphylaxis of BLE being treated by Dr. [name] who presented with  New ischemic ulcer to right 5th metatarsal  ?-admit to med-surg ?-vascular has already seen with plans for angiography but had no targetable lesions ?-Continue aspirin and orthopedic surgery recommended adding Trental prior to discharge ?-continue statin ?-ABIs ordered but she underwent a angiogram ?-continue IV abx with vanc/zosyn while hospitalized and changed to p.o. doxycycline for 7 days ?-check blood cultures/lactic acid. No SIRS/sepsis criteria  ?-wound care consult and appreciate Dr. [name] evaluating ?-Follow-up with Dr. [name] in outpatient setting as she has been stable from his perspective ?

## 2022-01-24 NOTE — Assessment & Plan Note (Addendum)
-  Baseline of 10-11 ?-Appears to be at baseline as hemoglobin/hematocrit was 10.8/34.2 ?-Continue to monitor and trend in outpatient setting ?

## 2022-01-24 NOTE — H&P (Addendum)
History and Physical    Patient: Amy Moses CVE:[id number] DOB: 1971-01-28 DOA: 01/24/2022 DOS: the patient was seen and examined on 01/24/2022 PCP: Emelda Fear, DO  Patient coming from: Dr. [name] office- lives with daughter and boyfriend.    Chief Complaint: foot wound   HPI: Amy Moses is a 51 y.o. female with medical history significant of ESRD on dialysis TTS, T2DM, HLD, HTN, hypothyroidism, anemia, blindness of right eye, calciphylaxis of BLE presenting from Dr. Jess Barters clinic with new right foot ulcer.  She is managed by Dr. [name] for this wound. He saw her today in clinic and recommended she come to ED and see vascular surgery for new ischemic ulcer on right 5th metatarsal. They just saw it today because her feet are wrapped.  She also has neuropathy and can't feel pain. Dr. Donzetta Matters saw patient in triage and recommended an angiogram and admission.   Denies any fever/chills, no headaches/dizziness, no chest pain or palpitations, no shortness of breath or cough, no abdominal pain, no N/V/D, no leg swelling, no dysuria.   She does not smoke or drink.   Last dialysis was Saturday, normal session.   ER Course:  vitals: afebrile, bp: 146/82, HR; 74, RR: 18, oxygen: 98% RA Pertinent labs: wbc: 10.6, hgb: 11.7, BUN: 48, creatinine: 5.59, glucose: 395,  In ED: given vanc/zosyn. Vascular surgery has seen her.    Review of Systems: As mentioned in the history of present illness. All other systems reviewed and are negative. Past Medical History:  Diagnosis Date   Anemia    Blindness of right eye with low vision in contralateral eye    s/p victrectomy   Diabetes mellitus, type II ([place])    Dyslipidemia    Glaucoma    History of blood transfusion    Hypertension    Hypothyroidism (acquired)    Kidney disease    Stage 5   Pneumonia    Past Surgical History:  Procedure Laterality Date   ABDOMINAL AORTOGRAM W/LOWER EXTREMITY Bilateral 12/18/2020   Procedure: ABDOMINAL AORTOGRAM  W/LOWER EXTREMITY;  Surgeon: Elam Dutch, MD;  Location: [place] [facility];  Service: Cardiovascular;  Laterality: Bilateral;   ANKLE FRACTURE SURGERY Right    AV FISTULA PLACEMENT Left 08/18/2020   Procedure: LEFT ARM BRACHIOCEPHALIC ARTERIOVENOUS (AV) FISTULA CREATION;  Surgeon: Elam Dutch, MD;  Location: [place];  Service: Vascular;  Laterality: Left;   BIOPSY  04/24/2021   Procedure: BIOPSY;  Surgeon: Eloise Harman, DO;  [facility];  Service: Endoscopy;;   CESAREAN SECTION     CHOLECYSTECTOMY     COLONOSCOPY  04/24/2021   Surgeon: Eloise Harman, DO;  nonbleeding internal hemorrhoids, 1 large (25 mm) pedunculated transverse colon polyp (prolapse type polyp) with adherent clot and stigmata of recent bleed.   COLONOSCOPY WITH PROPOFOL N/A 05/14/2021   Procedure: COLONOSCOPY WITH PROPOFOL;  Surgeon: Daneil Dolin, MD;  [facility];  Service: Endoscopy;  Laterality: N/A;   ESOPHAGOGASTRODUODENOSCOPY (EGD) WITH PROPOFOL N/A 04/24/2021   Surgeon: Eloise Harman, DO;  duodenal erosions and gastritis biopsied (pathology with peptic duodenitis, reactive gastropathy with erosions/chronic inflammation, negative for H. pylori)   EYE SURGERY     Vatrectomy   HEMOSTASIS CLIP PLACEMENT  05/14/2021   Procedure: HEMOSTASIS CLIP PLACEMENT;  Surgeon: Daneil Dolin, MD;  [facility];  Service: Endoscopy;;   IR PERC TUN PERIT CATH WO PORT S&I /IMAG  09/15/2020   IR REMOVAL TUN CV CATH W/O  FL  02/19/2021   IR US GUIDE VASC ACCESS RIGHT  09/15/2020   POLYPECTOMY  04/24/2021   Procedure: POLYPECTOMY;  Surgeon: Eloise Harman, DO;  [facility];  Service: Endoscopy;;   POLYPECTOMY  05/14/2021   Procedure: POLYPECTOMY;  Surgeon: Daneil Dolin, MD;  [facility];  Service: Endoscopy;;   SKIN SPLIT GRAFT Bilateral 09/03/2021   Procedure: SKIN GRAFT BILATERAL LEGS;  Surgeon: Newt Minion, MD;  Location: [place];  Service:  Orthopedics;  Laterality: Bilateral;   SKIN SPLIT GRAFT Left 12/10/2021   Procedure: IRRIGATION AND DEBRIDEMENT LEFT CALF, APPLICATION SPLIT THICKNESS SKIN GRAFT;  Surgeon: Newt Minion, MD;  Location: [place];  Service: Orthopedics;  Laterality: Left;   TOE SURGERY     Social History:  reports that she has never smoked. She has never used smokeless tobacco. She reports that she does not drink alcohol and does not use drugs.  Allergies  Allergen Reactions   Ace Inhibitors Cough    Family History  Problem Relation Age of Onset   Heart disease Mother    Diabetes Mother    Kidney disease Mother    Diabetes Father    Heart disease Father    Diabetes Brother    Colon cancer Neg Hx     Prior to Admission medications   Medication Sig Start Date End Date Taking? Authorizing Provider  acetaminophen (TYLENOL) 500 MG tablet Take 1,000 mg by mouth every 6 (six) hours as needed for moderate pain.    [provider]  aspirin EC 81 MG tablet Take 1 tablet (81 mg total) by mouth daily with breakfast. 05/19/21 05/19/22  Johnson, Clanford L, MD  atorvastatin (LIPITOR) 10 MG tablet Take 1 tablet (10 mg total) by mouth daily. Patient taking differently: Take 10 mg by mouth at bedtime. 04/25/21   Roxan Hockey, MD  HUMALOG KWIKPEN 100 UNIT/ML KwikPen Inject 2 Units into the skin 3 (three) times daily with meals. If eats 50% or more of meal. Patient taking differently: Inject 5-15 Units into the skin 3 (three) times daily with meals. Sliding Scale If eats 50% or more of meal. 05/15/21   Johnson, Clanford L, MD  insulin glargine (LANTUS) 100 UNIT/ML injection Inject 0.08 mLs (8 Units total) into the skin at bedtime. 08/11/21   Elodia Florence., MD  lactulose [facility]) 10 GM/15ML solution Take 10 g by mouth daily as needed for moderate constipation. 11/30/21   [provider]  levothyroxine (SYNTHROID) 50 MCG tablet Take 1 tablet (50 mcg total) by mouth daily before breakfast. 04/25/21    Roxan Hockey, MD  oxyCODONE-acetaminophen (PERCOCET/ROXICET) 5-325 MG tablet Take 1 tablet by mouth every 4 (four) hours as needed. 12/11/21   Newt Minion, MD  pantoprazole (PROTONIX) 40 MG tablet Take 1 tablet (40 mg total) by mouth 2 (two) times daily. 04/25/21 04/25/22  Roxan Hockey, MD  sevelamer carbonate (RENVELA) 800 MG tablet Take 1 tablet (800 mg total) by mouth 3 (three) times daily with meals. Patient taking differently: Take 1,600 mg by mouth 3 (three) times daily with meals. 04/25/21   Roxan Hockey, MD  torsemide (DEMADEX) 100 MG tablet Take 150 mg by mouth daily.  05/22/20   [provider]  Vitamin D, Ergocalciferol, (DRISDOL) 1.25 MG (50000 UNIT) CAPS capsule Take 50,000 Units by mouth every 14 (fourteen) days. 04/20/21   [provider]    Physical Exam: Vitals:   01/24/22 1619 01/24/22 1630 01/24/22 1700 01/24/22  1730  BP: 120/87 120/79 125/73 128/76  Pulse: 72 72 73 74  Resp: 18 18 18 17   Temp:      SpO2: 100% 100% 100% 98%  Weight:      Height:       General:  Appears calm and comfortable and is in NAD Eyes:  PERRL, EOMI, normal lids, iris ENT:  grossly normal hearing, lips & tongue, mmm; appropriate dentition Neck:  no LAD, masses or thyromegaly; no carotid bruits Cardiovascular:  RRR, no m/r/g. Mild BLE edema. Bilateral calves wrapped.  Respiratory:   CTA bilaterally with no wheezes/rales/rhonchi.  Normal respiratory effort. Abdomen:  soft, NT, ND, NABS Back:   normal alignment, no CVAT Skin:  no rash or induration seen on limited exam. HD fistula on RUE, good thrill. Right 5th metatarsal with wound and exposed fat with black overlying tissue. She also has chronic wounds to bilateral calves that are wrapped and sacral pressure ulcers.    Musculoskeletal:  grossly normal tone BUE. Weakness in RLE due to patellar fracture.  Lower extremity: foot exam above. Thready pedal pulses bilaterally Psychiatric:  grossly normal mood and affect, speech  fluent and appropriate, AOx3 Neurologic:  CN 2-12 grossly intact, moves all extremities in coordinated fashion, sensation intact   Radiological Exams on Admission: Independently reviewed - see discussion in A/P where applicable  No results found.  EKG: Independently reviewed.  NSR with rate 84; nonspecific ST changes with no evidence of acute ischemia   Labs on Admission: I have personally reviewed the available labs and imaging studies at the time of the admission.  Pertinent labs:   wbc: 10.6 hgb: 11.7,  BUN: 48,  creatinine: 5.59,  glucose: 395,    Assessment and Plan: * Ischemic ulcer of right foot ([place]) 51 year old female with hx of calciphylaxis of BLE being treated by Dr. [name] who presented with  New ischemic ulcer to right 5th metatarsal  -admit to med-surg -vascular has already seen with plans for angiography tomorrow, NPO at midnight -okay with continuing ASA -continue statin -ABIs ordered -continue IV abx with vanc/zosyn  -check blood cultures/lactic acid. No SIRS/sepsis criteria  -wound care consult   End stage renal disease ([place]) Dialysis TTS and had full session on Saturday Nephrology consulted for routine dialysis tomorrow No signs of volume overload or need for emergent dialysis  Continue auryxia and torsemide  Type 2 diabetes mellitus with complication, without long-term current use of insulin (HCC) a1c uncontrolled, a1c of 12.2 in 11/2021  Continue lantus 8 units qhs, likely needs titrated (decreased to 5 units since NPO at midnight) SSI and accuchecks per protocol    Sacral pressure ulcer Wound care consult   Primary hypothyroidism TSH in 2021 was >6, repeat tsh pending  Continue synthroid for now   Hyperlipidemia Continue lipitor   Normocytic anemia Baseline of 10-11 Continue to monitor   Essential hypertension, benign Blood pressure very well controlled On no medication, monitor   Metabolic acidosis, increased anion gap She has  increased AG, but likely secondary to renal disease as her AG has been around 16 BS 395, check vbg/beta hydroxybutyrate acid with foot ulcer to make sure not mild DKA Will give novolog now (has hx of labile BS) SSI and lantus have been ordered.   Calciphylaxis Wound care consult for bilateral calves. Currently being treated by dr. [name]     Advance Care Planning:   Code Status: Full Code   Consults: vascular surgery: Dr. Donzetta Matters, nephrology: Dr. Carolin Sicks  DVT Prophylaxis: SCDs  Family Communication: called and updated her daughter: Amy Moses: [phone]  Severity of Illness: The appropriate patient status for this patient is INPATIENT. Inpatient status is judged to be reasonable and necessary in order to provide the required intensity of service to ensure the patient's safety. The patient's presenting symptoms, physical exam findings, and initial radiographic and laboratory data in the context of their chronic comorbidities is felt to place them at high risk for further clinical deterioration. Furthermore, it is not anticipated that the patient will be medically stable for discharge from the hospital within 2 midnights of admission.   * I certify that at the point of admission it is my clinical judgment that the patient will require inpatient hospital care spanning beyond 2 midnights from the point of admission due to high intensity of service, high risk for further deterioration and high frequency of surveillance required.*  Author: Orma Flaming, MD 01/24/2022 8:19 PM  For on call review [email or web address].

## 2022-01-25 ENCOUNTER — Telehealth: Payer: Self-pay | Admitting: Orthopedic Surgery

## 2022-01-25 ENCOUNTER — Encounter (HOSPITAL_COMMUNITY)
Admission: EM | Disposition: A | Discharge status: Home / Self Care | Payer: Self-pay | Source: Home / Self Care | Attending: Internal Medicine

## 2022-01-25 DIAGNOSIS — L97519 Non-pressure chronic ulcer of other part of right foot with unspecified severity: Secondary | ICD-10-CM

## 2022-01-25 HISTORY — PX: ABDOMINAL AORTOGRAM W/LOWER EXTREMITY: CATH118223

## 2022-01-25 LAB — CBC
HCT: 38.4 % (ref 36.0–46.0)
HCT: 34.8 % — ABNORMAL LOW (ref 36.0–46.0)
Hemoglobin: 12.1 g/dL (ref 12.0–15.0)
Hemoglobin: 10.8 g/dL — ABNORMAL LOW (ref 12.0–15.0)
MCH: 27.7 pg (ref 26.0–34.0)
MCH: 27.3 pg (ref 26.0–34.0)
MCHC: 31.5 g/dL (ref 30.0–36.0)
MCHC: 31 g/dL (ref 30.0–36.0)
MCV: 87.9 fL (ref 80.0–100.0)
MCV: 88.1 fL (ref 80.0–100.0)
Platelets: 189 10*3/uL (ref 150–400)
Platelets: 174 10*3/uL (ref 150–400)
RBC: 4.37 MIL/uL (ref 3.87–5.11)
RBC: 3.95 MIL/uL (ref 3.87–5.11)
RDW: 14.1 % (ref 11.5–15.5)
RDW: 14.2 % (ref 11.5–15.5)
WBC: 9.6 10*3/uL (ref 4.0–10.5)
WBC: 8.7 10*3/uL (ref 4.0–10.5)
nRBC: 0 % (ref 0.0–0.2)
nRBC: 0 % (ref 0.0–0.2)

## 2022-01-25 LAB — BASIC METABOLIC PANEL
Anion gap: 17 — ABNORMAL HIGH (ref 5–15)
BUN: 53 mg/dL — ABNORMAL HIGH (ref 6–20)
CO2: 23 mmol/L (ref 22–32)
Calcium: 9.4 mg/dL (ref 8.9–10.3)
Chloride: 95 mmol/L — ABNORMAL LOW (ref 98–111)
Creatinine, Ser: 5.82 mg/dL — ABNORMAL HIGH (ref 0.44–1.00)
GFR, Estimated: 8 mL/min — ABNORMAL LOW (ref >60)
Glucose, Bld: 354 mg/dL — ABNORMAL HIGH (ref 70–99)
Potassium: 4.7 mmol/L (ref 3.5–5.1)
Sodium: 135 mmol/L (ref 135–145)

## 2022-01-25 LAB — RENAL FUNCTION PANEL
Albumin: 2.8 g/dL — ABNORMAL LOW (ref 3.5–5.0)
Anion gap: 11 (ref 5–15)
BUN: 56 mg/dL — ABNORMAL HIGH (ref 6–20)
CO2: 26 mmol/L (ref 22–32)
Calcium: 8.8 mg/dL — ABNORMAL LOW (ref 8.9–10.3)
Chloride: 96 mmol/L — ABNORMAL LOW (ref 98–111)
Creatinine, Ser: 6.01 mg/dL — ABNORMAL HIGH (ref 0.44–1.00)
GFR, Estimated: 8 mL/min — ABNORMAL LOW (ref >60)
Glucose, Bld: 204 mg/dL — ABNORMAL HIGH (ref 70–99)
Phosphorus: 7.7 mg/dL — ABNORMAL HIGH (ref 2.5–4.6)
Potassium: 4.6 mmol/L (ref 3.5–5.1)
Sodium: 133 mmol/L — ABNORMAL LOW (ref 135–145)

## 2022-01-25 LAB — LACTIC ACID, PLASMA: Lactic Acid, Venous: 1.7 mmol/L (ref 0.5–1.9)

## 2022-01-25 LAB — HEPATITIS B SURFACE ANTIBODY,QUALITATIVE: Hep B S Ab: NONREACTIVE

## 2022-01-25 LAB — BETA-HYDROXYBUTYRIC ACID: Beta-Hydroxybutyric Acid: <0.05 mmol/L — ABNORMAL LOW (ref 0.05–0.27)

## 2022-01-25 LAB — GLUCOSE, CAPILLARY
Glucose-Capillary: 206 mg/dL — ABNORMAL HIGH (ref 70–99)
Glucose-Capillary: 142 mg/dL — ABNORMAL HIGH (ref 70–99)
Glucose-Capillary: 182 mg/dL — ABNORMAL HIGH (ref 70–99)
Glucose-Capillary: 261 mg/dL — ABNORMAL HIGH (ref 70–99)

## 2022-01-25 LAB — HEPATITIS B SURFACE ANTIGEN: Hepatitis B Surface Ag: NONREACTIVE

## 2022-01-25 SURGERY — ABDOMINAL AORTOGRAM W/LOWER EXTREMITY
Anesthesia: LOCAL | Laterality: Bilateral

## 2022-01-25 MED ORDER — SODIUM CHLORIDE 0.9 % IV SOLN: 100.0000 mL | INTRAVENOUS | Status: DC | PRN

## 2022-01-25 MED ORDER — LIDOCAINE HCL (PF) 1 % IJ SOLN
INTRAMUSCULAR | Status: AC
  Filled 2022-01-25: qty 30

## 2022-01-25 MED ORDER — FENTANYL CITRATE (PF) 100 MCG/2ML IJ SOLN
INTRAMUSCULAR | Status: AC
  Filled 2022-01-25: qty 2

## 2022-01-25 MED ORDER — HEPARIN (PORCINE) IN NACL 1000-0.9 UT/500ML-% IV SOLN
INTRAVENOUS | Status: AC
  Filled 2022-01-25: qty 500

## 2022-01-25 MED ORDER — ALTEPLASE 2 MG IJ SOLR
2.0000 mg | Freq: Once | INTRAMUSCULAR | Status: DC | PRN
  Filled 2022-01-25: qty 2

## 2022-01-25 MED ORDER — CHLORHEXIDINE GLUCONATE CLOTH 2 % EX PADS
6.0000 | MEDICATED_PAD | Freq: Every day | CUTANEOUS | Status: DC
  Administered 2022-01-25 – 2022-01-26 (×2): 6 via TOPICAL

## 2022-01-25 MED ORDER — ACETAMINOPHEN 325 MG PO TABS: 650.0000 mg | ORAL_TABLET | ORAL | Status: DC | PRN

## 2022-01-25 MED ORDER — LIDOCAINE-PRILOCAINE 2.5-2.5 % EX CREA
1.0000 "application " | TOPICAL_CREAM | CUTANEOUS | Status: DC | PRN
  Filled 2022-01-25: qty 5

## 2022-01-25 MED ORDER — LABETALOL HCL 5 MG/ML IV SOLN: 10.0000 mg | INTRAVENOUS | Status: DC | PRN

## 2022-01-25 MED ORDER — SODIUM CHLORIDE 0.9% FLUSH: 3.0000 mL | INTRAVENOUS | Status: DC | PRN

## 2022-01-25 MED ORDER — HEPARIN SODIUM (PORCINE) 1000 UNIT/ML DIALYSIS
1000.0000 [IU] | INTRAMUSCULAR | Status: DC | PRN
  Filled 2022-01-25: qty 1

## 2022-01-25 MED ORDER — ONDANSETRON HCL 4 MG/2ML IJ SOLN
4.0000 mg | Freq: Four times a day (QID) | INTRAMUSCULAR | Status: DC | PRN
Start: 2022-01-25 — End: 2022-01-26

## 2022-01-25 MED ORDER — MORPHINE SULFATE (PF) 2 MG/ML IV SOLN: 2.0000 mg | INTRAVENOUS | Status: DC | PRN

## 2022-01-25 MED ORDER — SODIUM CHLORIDE 0.9% FLUSH: 3.0000 mL | Freq: Two times a day (BID) | INTRAVENOUS | Status: DC

## 2022-01-25 MED ORDER — FENTANYL CITRATE (PF) 100 MCG/2ML IJ SOLN
INTRAMUSCULAR | Status: DC | PRN
  Administered 2022-01-25: 50 ug via INTRAVENOUS

## 2022-01-25 MED ORDER — IODIXANOL 320 MG/ML IV SOLN
INTRAVENOUS | Status: DC | PRN
Start: 2022-01-25 — End: 2022-01-25
  Administered 2022-01-25: 200 mL

## 2022-01-25 MED ORDER — HEPARIN (PORCINE) IN NACL 1000-0.9 UT/500ML-% IV SOLN
INTRAVENOUS | Status: DC | PRN
  Administered 2022-01-25 (×2): 500 mL

## 2022-01-25 MED ORDER — MIDAZOLAM HCL 2 MG/2ML IJ SOLN
INTRAMUSCULAR | Status: AC
  Filled 2022-01-25: qty 2

## 2022-01-25 MED ORDER — SODIUM CHLORIDE 0.9 % IV SOLN: 250.0000 mL | INTRAVENOUS | Status: DC | PRN

## 2022-01-25 MED ORDER — HYDRALAZINE HCL 20 MG/ML IJ SOLN: 5.0000 mg | INTRAMUSCULAR | Status: DC | PRN

## 2022-01-25 MED ORDER — LIDOCAINE HCL (PF) 1 % IJ SOLN
5.0000 mL | INTRAMUSCULAR | Status: DC | PRN
  Filled 2022-01-25: qty 5

## 2022-01-25 MED ORDER — PENTAFLUOROPROP-TETRAFLUOROETH EX AERO: 1.0000 "application " | INHALATION_SPRAY | CUTANEOUS | Status: DC | PRN

## 2022-01-25 MED ORDER — MIDAZOLAM HCL 2 MG/2ML IJ SOLN
INTRAMUSCULAR | Status: DC | PRN
  Administered 2022-01-25: 1 mg via INTRAVENOUS

## 2022-01-25 MED ORDER — LIDOCAINE HCL (PF) 1 % IJ SOLN
INTRAMUSCULAR | Status: DC | PRN
  Administered 2022-01-25: 15 mL

## 2022-01-25 MED ORDER — OXYCODONE HCL 5 MG PO TABS: 5.0000 mg | ORAL_TABLET | ORAL | Status: DC | PRN

## 2022-01-25 SURGICAL SUPPLY — 10 items
CATH OMNI FLUSH 5F 65CM (CATHETERS) ×2 IMPLANT
DEVICE VASC CLSR CELT ART 5 (Vascular Products) ×2 IMPLANT
GUIDEWIRE ANGLED .035X150CM (WIRE) ×2 IMPLANT
KIT MICROPUNCTURE NIT STIFF (SHEATH) ×2 IMPLANT
KIT PV (KITS) ×3 IMPLANT
SHEATH PINNACLE 5F 10CM (SHEATH) ×2 IMPLANT
SYR MEDRAD MARK V 150ML (SYRINGE) ×2 IMPLANT
TRANSDUCER W/STOPCOCK (MISCELLANEOUS) ×3 IMPLANT
TRAY PV CATH (CUSTOM PROCEDURE TRAY) ×3 IMPLANT
WIRE BENTSON .035X145CM (WIRE) ×2 IMPLANT

## 2022-01-25 NOTE — Consult Note (Addendum)
Referring Provider: No ref. provider found Primary Care Physician:  Emelda Fear, DO Primary Nephrologist:  Dr.   Luiz Iron for Consultation: Management of end-stage renal disease, maintenance euvolemia.  Treatment assessment of anemia, treatment assessment secondary hyperparathyroidism  HPI: [name] with a medical history of end-stage renal disease Tuesday Thursday Saturday dialysis diabetes mellitus type 2 hypertension hyperlipidemia hypothyroidism and calciphylaxis of bilateral lower extremities.  She presents to Dr. Jess Barters clinic with right foot ulcer.  She was brought into the hospital for vascular surgery to evaluate new ischemic ulcer right fifth metatarsal.  Blood pressure 112/70 pulse 72 temperature 98.2 O2 sats 100% room air  Sodium 135 potassium 4.7 chloride 95 CO2 23 BUN 53 creatinine 5.8 glucose 01/22/1951 hemoglobin 12.1   Past Medical History:  Diagnosis Date   Anemia    Blindness of right eye with low vision in contralateral eye    s/p victrectomy   Diabetes mellitus, type II ([facility])    Dyslipidemia    Glaucoma    History of blood transfusion    Hypertension    Hypothyroidism (acquired)    Kidney disease    Stage 5   Pneumonia     Past Surgical History:  Procedure Laterality Date   ABDOMINAL AORTOGRAM W/LOWER EXTREMITY Bilateral 12/18/2020   Procedure: ABDOMINAL AORTOGRAM W/LOWER EXTREMITY;  Surgeon: Elam Dutch, MD;  Location: [place] [facility];  Service: Cardiovascular;  Laterality: Bilateral;   ANKLE FRACTURE SURGERY Right    AV FISTULA PLACEMENT Left 08/18/2020   Procedure: LEFT ARM BRACHIOCEPHALIC ARTERIOVENOUS (AV) FISTULA CREATION;  Surgeon: Elam Dutch, MD;  Location: [place];  Service: Vascular;  Laterality: Left;   BIOPSY  04/24/2021   Procedure: BIOPSY;  Surgeon: Eloise Harman, DO;  [facility];  Service: Endoscopy;;   CESAREAN SECTION     CHOLECYSTECTOMY     COLONOSCOPY  04/24/2021   Surgeon: Eloise Harman, DO;  nonbleeding internal hemorrhoids, 1 large (25 mm) pedunculated transverse colon polyp (prolapse type polyp) with adherent clot and stigmata of recent bleed.   COLONOSCOPY WITH PROPOFOL N/A 05/14/2021   Procedure: COLONOSCOPY WITH PROPOFOL;  Surgeon: Daneil Dolin, MD;  [facility];  Service: Endoscopy;  Laterality: N/A;   ESOPHAGOGASTRODUODENOSCOPY (EGD) WITH PROPOFOL N/A 04/24/2021   Surgeon: Eloise Harman, DO;  duodenal erosions and gastritis biopsied (pathology with peptic duodenitis, reactive gastropathy with erosions/chronic inflammation, negative for H. pylori)   EYE SURGERY     Vatrectomy   HEMOSTASIS CLIP PLACEMENT  05/14/2021   Procedure: HEMOSTASIS CLIP PLACEMENT;  Surgeon: Daneil Dolin, MD;  [facility];  Service: Endoscopy;;   IR PERC TUN PERIT CATH WO PORT S&I /IMAG  09/15/2020   IR REMOVAL TUN CV CATH W/O FL  02/19/2021   IR US GUIDE VASC ACCESS RIGHT  09/15/2020   POLYPECTOMY  04/24/2021   Procedure: POLYPECTOMY;  Surgeon: Eloise Harman, DO;  [facility];  Service: Endoscopy;;   POLYPECTOMY  05/14/2021   Procedure: POLYPECTOMY;  Surgeon: Daneil Dolin, MD;  [facility];  Service: Endoscopy;;   SKIN SPLIT GRAFT Bilateral 09/03/2021   Procedure: SKIN GRAFT BILATERAL LEGS;  Surgeon: Newt Minion, MD;  Location: [place];  Service: Orthopedics;  Laterality: Bilateral;   SKIN SPLIT GRAFT Left 12/10/2021   Procedure: IRRIGATION AND DEBRIDEMENT LEFT CALF, APPLICATION SPLIT THICKNESS SKIN GRAFT;  Surgeon: Newt Minion, MD;  Location: [place];  Service: Orthopedics;  Laterality: Left;   TOE SURGERY      Prior to Admission medications   Medication Sig Start Date End Date Taking? Authorizing Provider  acetaminophen (TYLENOL) 500 MG tablet Take 1,000 mg by mouth every 6 (six) hours as needed for moderate pain.   Yes [provider]  aspirin EC 81 MG tablet Take 1 tablet (81 mg total) by mouth daily with  breakfast. 05/19/21 05/19/22 Yes Johnson, Clanford L, MD  atorvastatin (LIPITOR) 10 MG tablet Take 1 tablet (10 mg total) by mouth daily. Patient taking differently: Take 10 mg by mouth at bedtime. 04/25/21  Yes Roxan Hockey, MD  AURYXIA 1 GM 210 MG(Fe) tablet Take 420 mg by mouth 2 (two) times daily with a meal. 01/07/22  Yes [provider]  HUMALOG KWIKPEN 100 UNIT/ML KwikPen Inject 2 Units into the skin 3 (three) times daily with meals. If eats 50% or more of meal. Patient taking differently: Inject 5-15 Units into the skin 3 (three) times daily with meals. Sliding Scale If eats 50% or more of meal. 05/15/21  Yes Johnson, Clanford L, MD  insulin glargine (LANTUS) 100 UNIT/ML injection Inject 0.08 mLs (8 Units total) into the skin at bedtime. 08/11/21  Yes Elodia Florence., MD  lactulose [name]) 10 GM/15ML solution Take 10 g by mouth daily as needed for moderate constipation. 11/30/21  Yes [provider]  levothyroxine (SYNTHROID) 50 MCG tablet Take 1 tablet (50 mcg total) by mouth daily before breakfast. 04/25/21  Yes Emokpae, Courage, MD  pantoprazole (PROTONIX) 40 MG tablet Take 1 tablet (40 mg total) by mouth 2 (two) times daily. 04/25/21 04/25/22 Yes Emokpae, Courage, MD  torsemide (DEMADEX) 100 MG tablet Take 150 mg by mouth daily.  05/22/20  Yes [provider]  Vitamin D, Ergocalciferol, (DRISDOL) 1.25 MG (50000 UNIT) CAPS capsule Take 50,000 Units by mouth every 14 (fourteen) days. 04/20/21  Yes [provider]  oxyCODONE-acetaminophen (PERCOCET/ROXICET) 5-325 MG tablet Take 1 tablet by mouth every 4 (four) hours as needed. Patient not taking: Reported on 01/24/2022 12/11/21   Newt Minion, MD  sevelamer carbonate (RENVELA) 800 MG tablet Take 1 tablet (800 mg total) by mouth 3 (three) times daily with meals. Patient not taking: Reported on 01/24/2022 04/25/21   Roxan Hockey, MD    Current Facility-Administered Medications  Medication Dose Route  Frequency Provider Last Rate Last Admin   0.9 %  sodium chloride infusion  250 mL Intravenous PRN Orma Flaming, MD       acetaminophen (TYLENOL) tablet 650 mg  650 mg Oral Q6H PRN Orma Flaming, MD       Or   acetaminophen (TYLENOL) suppository 650 mg  650 mg Rectal Q6H PRN Orma Flaming, MD       aspirin EC tablet 81 mg  81 mg Oral Q breakfast Orma Flaming, MD       atorvastatin (LIPITOR) tablet 10 mg  10 mg Oral QHS Orma Flaming, MD   10 mg at 01/24/22 2330   ferric citrate (AURYXIA) tablet 420 mg  420 mg Oral BID WC Orma Flaming, MD       insulin aspart (novoLOG) injection 0-6 Units  0-6 Units Subcutaneous TID WC Orma Flaming, MD       insulin glargine-yfgn [facility]) injection 5 Units  5 Units Subcutaneous QHS Orma Flaming, MD   5 Units at 01/24/22 2330   lactulose (CHRONULAC) 10 GM/15ML solution 10 g  10 g Oral Daily PRN Orma Flaming, MD  levothyroxine (SYNTHROID) tablet 50 mcg  50 mcg Oral QAC breakfast Orma Flaming, MD       oxyCODONE (Oxy IR/ROXICODONE) immediate release tablet 5 mg  5 mg Oral Q4H PRN Orma Flaming, MD       pantoprazole (PROTONIX) EC tablet 40 mg  40 mg Oral BID Orma Flaming, MD   40 mg at 01/24/22 2330   piperacillin-tazobactam (ZOSYN) IVPB 2.25 g  2.25 g Intravenous Q8H Orma Flaming, MD 100 mL/hr at 01/25/22 0337 2.25 g at 01/25/22 7341   sodium chloride flush (NS) 0.9 % injection 3 mL  3 mL Intravenous Q12H Orma Flaming, MD   3 mL at 01/24/22 2331   sodium chloride flush (NS) 0.9 % injection 3 mL  3 mL Intravenous PRN Orma Flaming, MD       torsemide [facility]) tablet 150 mg  150 mg Oral Daily Orma Flaming, MD   150 mg at 01/24/22 1859   vancomycin (VANCOREADY) IVPB 750 mg/150 mL  750 mg Intravenous Once Orma Flaming, MD       Vitamin D (Ergocalciferol) (DRISDOL) capsule 50,000 Units  50,000 Units Oral Q14 Days Orma Flaming, MD   50,000 Units at 01/24/22 2020    Allergies as of 01/24/2022 - Review Complete 01/24/2022  Allergen  Reaction Noted   Ace inhibitors Cough 11/19/2020    Family History  Problem Relation Age of Onset   Heart disease Mother    Diabetes Mother    Kidney disease Mother    Diabetes Father    Heart disease Father    Diabetes Brother    Colon cancer Neg Hx     Social History   Socioeconomic History   Marital status: Single    Spouse name: Not on file   Number of children: Not on file   Years of education: Not on file   Highest education level: Not on file  Occupational History   Not on file  Tobacco Use   Smoking status: Never   Smokeless tobacco: Never  Vaping Use   Vaping Use: Never used  Substance and Sexual Activity   Alcohol use: No   Drug use: No   Sexual activity: Yes    Birth control/protection: Condom  Other Topics Concern   Not on file  Social History Narrative   Not on file   Social Determinants of Health   Financial Resource Strain: Not on file  Food Insecurity: Not on file  Transportation Needs: Not on file  Physical Activity: Not on file  Stress: Not on file  Social Connections: Not on file  Intimate Partner Violence: Not on file    Review of Systems: Per HPI  Physical Exam: Vital signs in last 24 hours: Temp:  [97.7 F (36.5 C)-98.5 F (36.9 C)] 98.3 F (36.8 C) (03/07 0801) Pulse Rate:  [71-81] 71 (03/07 0801) Resp:  [17-21] 21 (03/07 0801) BP: (106-146)/(66-87) 112/70 (03/07 0801) SpO2:  [98 %-100 %] 100 % (03/07 0801) Weight:  [88 kg] 88 kg (03/06 1359)   General: Elderly nondistressed Head:  Normocephalic and atraumatic. Eyes:  Sclera clear, no icterus.   Conjunctiva pink. Ears:  Normal auditory acuity. Nose:  No deformity, discharge,  or lesions. Mouth:  No deformity or lesions, dentition normal. Neck:  Supple; no masses or thyromegaly. JVP not elevated Lungs:  Clear throughout to auscultation.   No wheezes, crackles, or rhonchi. No acute distress. Heart:  Regular rate and rhythm; no murmurs, clicks, rubs,  or gallops. Abdomen:   Soft, nontender  and nondistended. No masses, hepatosplenomegaly or hernias noted. Normal bowel sounds, without guarding, and without rebound.   Msk:  Symmetrical without gross deformities. Normal posture. Pulses:  No carotid, renal, femoral bruits. DP and PT symmetrical and equal Extremities:  Without clubbing or edema.  Left upper extremity AV fistula Neurologic:  Alert and  oriented x4;  grossly normal neurologically. Skin:  Intact without significant lesions or rashes. Cervical Nodes:  No significant cervical adenopathy. Psych:  Alert and cooperative. Normal mood and affect.  Intake/Output from previous day: 03/06 0701 - 03/07 0700 In: 242.6 [IV Piggyback:242.6] Out: -  Intake/Output this shift: No intake/output data recorded.  Lab Results: Recent Labs    01/24/22 1625 01/24/22 1636 01/24/22 2329  WBC 10.6*  --  9.6  HGB 11.7* 13.6 12.1  HCT 37.9 40.0 38.4  PLT 201  --  189   BMET Recent Labs    01/24/22 1625 01/24/22 1636 01/24/22 2329  NA 132* 132* 135  K 4.6 4.6 4.7  CL 93* 96* 95*  CO2 23  --  23  GLUCOSE 395* 392* 354*  BUN 48* 48* 53*  CREATININE 5.59* 5.80* 5.82*  CALCIUM 9.2  --  9.4   LFT Recent Labs    01/24/22 1625  PROT 7.0  ALBUMIN 3.3*  AST 11*  ALT 10  ALKPHOS 142*  BILITOT 0.9   PT/INR No results for input(s): LABPROT, INR in the last 72 hours. Hepatitis Panel No results for input(s): HEPBSAG, HCVAB, HEPAIGM, HEPBIGM in the last 72 hours.  Studies/Results: No results found.  Assessment/Plan: ESRD-Tuesday Thursday Saturday [facility].  We will plan dialysis 01/25/2022 ANEMIA-does not appear to be an issue at this time. MBD-continue phosphorus binders and dialysis HTN/VOL-controlled at this point euvolemic Bilateral calciphylaxis followed by Dr. [name].  Now with new ischemic ulcer followed by vascular surgery    LOS: [place] @TODAY @8 :25 AM

## 2022-01-25 NOTE — Interval H&P Note (Signed)
History and Physical Interval Note: ? ?01/25/2022 ?9:54 AM ? ?Amy Moses  has presented today for surgery, with the diagnosis of pad w/ ulcer.  The various methods of treatment have been discussed with the patient and family. After consideration of risks, benefits and other options for treatment, the patient has consented to  Procedure(s): ?ABDOMINAL AORTOGRAM W/LOWER EXTREMITY (N/A) as a surgical intervention.  The patient's history has been reviewed, patient examined, no change in status, stable for surgery.  I have reviewed the patient's chart and labs.  Questions were answered to the patient's satisfaction.   ? ? ?[name] ? ? ?

## 2022-01-25 NOTE — Op Note (Addendum)
? ? ?  Patient name: Amy Moses MRN: [id number] DOB: Jun 22, 1971 Sex: female ? ?01/25/2022 ?Pre-operative Diagnosis: Right leg ulcer ?Post-operative diagnosis:  Same ?Surgeon:  Annamarie Major ?Procedure Performed: ? 1.  Ultrasound-guided access, left femoral artery ? 2.  Abdominal aortogram ? 3.  Bilateral lower extremity runoff ? 4.  Third-order catheterization ? 5.  Conscious sedation, 36 minutes ? 6.  Closure device, Celt ?  ? ? ?Indications: This is a 51 year old female with end-stage renal disease on dialysis with a new right foot ulcer in the setting of calciphylaxis.  She is here today for angiographic evaluation. ? ?Procedure:  The patient was identified in the holding area and taken to room 8.  The patient was then placed supine on the table and prepped and draped in the usual sterile fashion.  A time out was called.  Conscious sedation was administered with the use of IV fentanyl and Versed under continuous physician and nurse monitoring.  Heart rate, blood pressure, and oxygen saturation were continuously monitored.  Total sedation time was 36 minutes.  Ultrasound was used to evaluate the left common femoral artery.  It was patent .  A digital ultrasound image was acquired.  A micropuncture needle was used to access the left common femoral artery under ultrasound guidance.  An 018 wire was advanced without resistance and a micropuncture sheath was placed.  The 018 wire was removed and a benson wire was placed.  The micropuncture sheath was exchanged for a 5 french sheath.  An omniflush catheter was advanced over the wire to the level of L-1.  An abdominal angiogram was obtained.  Next the Omni Flush catheter was pulled out of the aortic bifurcation and bilateral runoff was performed.  I then crossed the aortic bifurcation with a Omni Flush cath and a Glidewire.  The catheter was positioned into the right superficial femoral artery and additional images were obtained.  A Celt was used for closure ?Findings:   ? Aortogram: No obvious renal stenosis identified.  The infrarenal abdominal aorta is widely patent.  Bilateral common and external iliac arteries are widely patent. ? Right Lower Extremity: Right common femoral profundofemoral and superficial femoral artery are widely patent.  The popliteal artery is widely patent.  There is two-vessel runoff via the peroneal and posterior tibial artery.  There is diffuse disease of the small vessels in the foot ? Left Lower Extremity: Left common femoral profundofemoral and superficial femoral artery are widely patent.  The popliteal artery is widely patent.  The peroneal artery is the dominant runoff.  There is reconstitution of the posterior tibial from peroneal collaterals at the ankle.  Anterior tibial artery appears to become diminutive in the lower leg ? ?Intervention: None ? ?Impression: ? #1  No evidence of inflow or outflow stenosis ? #2  On the right, there is two-vessel runoff via the peroneal and posterior tibial artery without any lesions amenable to intervention ? #3  Dominant runoff on the left is the peroneal artery ?  ?V. Annamarie Major, M.D., FACS ?[facility] ?Office: [phone] ?Pager:   [phone] ?

## 2022-01-25 NOTE — Telephone Encounter (Signed)
Since the records have been faxed, perhaps this is about something new. ?

## 2022-01-25 NOTE — Progress Notes (Signed)
Pt arrived to unit from cath lab  A/O x 4,  CCMD called ,CHG given, pt oriented to unit, bilateral legs wrapped with ace wrap. Pt has multiple stage 4 wounds on thigh and sacrum. Wound Nurse is consulted and aware . Dressing orders placed. Pt left groin assess level 0. Pt uses wheelchair at home for mobility lives with daughter. ? ?Phoebe Sharps, RN ? ? ? ? 01/25/22 1706  ?Vitals  ?Temp 97.7 ?F (36.5 ?C)  ?Temp Source Oral  ?BP 121/74  ?MAP (mmHg) 89  ?BP Location Right Arm  ?BP Method Automatic  ?Patient Position (if appropriate) Lying  ?Pulse Rate 74  ?Pulse Rate Source Monitor  ?ECG Heart Rate 74  ?Resp 19  ?Level of Consciousness  ?Level of Consciousness Alert  ?Oxygen Therapy  ?SpO2 99 %  ?O2 Device Room Air  ?O2 Flow Rate (L/min) 0 L/min  ?Pain Assessment  ?Pain Scale 0-10  ?Pain Score 0  ?MEWS Score  ?MEWS Temp 0  ?MEWS Systolic 0  ?MEWS Pulse 0  ?MEWS RR 0  ?MEWS LOC 0  ?MEWS Score 0  ?MEWS Score Color Green  ? ? ?

## 2022-01-25 NOTE — Consult Note (Signed)
WOC Nurse Consult Note: ?Reason for Consult:Bilateral ischial tuberosity chronic, nonhealing Stage 4 pressure injuries and a left medial thigh small, full thickness tunneling wound. Both were treated previously by the outpatient wound care center at [name] by Dr. Jerilynn Mages. Robson/Hoyt Stone III, PA-C, but are now under the care of Dr. Eather Colas. ?The IT wounds had healed but have reopened. Patient believes that this is due to her ineffective wheelchair cushion. Her HHRN is reportedly trying to get her a different cushion. ?Wound type:Pressure and full thickness ?Pressure Injury POA: Yes ?Measurement: ?Left IT: 4cm x 3cm x 0.2cm with maceration in the surrounding area. Red wound bed. ?Right IT: 2cm x 3cm with maceration in the surrounding area. Red wound bed. ?Left medial thigh: 0.4cm round x 4cm not able to visualize wound bed. Cotton tipped applicator reveals serous exudate. ?Wound bed:As noted above ?Drainage (amount, consistency, odor) moderate serous ?Periwound: with evidence of previous wound healing (scarring) ?Dressing procedure/placement/frequency: ?I have provided Nursing with guidance for the daily care of her wounds using a silver hydrofiber. As we do not carry hydrofera blue, another topical antimicrobial dressing used to managed exudate, patient is amenable to the change, despite having brought her hydrofera blue to the hospital. Dressing changes will be daily and topped with a silicone foam. The HOB is to be kept at or below a 30 degree angle while in bed to decrease pressure on the ITs. ? ?The sacrum is intact and a silicone foam is used there prophylactically. ? ?The patient requires a seating evaluation and a new wheelchair cushion. She is also educated on boosting and other pressure redistribution maneuvers to relieve pressure to her ITs including returning to bed at intervals. ? ?[place] nursing team will not follow, but will remain available to this patient, the nursing and medical teams.  Please re-consult if  needed. ?Thanks, ?Maudie Flakes, MSN, RN, [place], [place], CWON-AP, [place]  ?Pager# [phone]  ? ? ? ?  ?

## 2022-01-25 NOTE — Telephone Encounter (Signed)
Received request for 01/03/22 & 01/24/22 ov notes. I faxed [phone] ?

## 2022-01-25 NOTE — Progress Notes (Addendum)
Assumed care of pt, remains in cath lab holding, bay 6, monitor in place, denies pain at this time, see assessment, ace wrap dressings noted to BLE, left popliteal pulse palpable at +1, safety maintained ? ?

## 2022-01-25 NOTE — Telephone Encounter (Signed)
Can I still sign off on this?  ?

## 2022-01-25 NOTE — Telephone Encounter (Signed)
Amy Moses from [employer] I think that's the name she said called requesting [place] to return her call. She stated it's important for the return call. Phone number is [phone] ?

## 2022-01-25 NOTE — Progress Notes (Signed)
PROGRESS NOTE  Amy Moses ZDG:[id number] DOB: [birth year] DOA: 01/24/2022 PCP: Emelda Fear, DO   LOS: 1 day   Brief Narrative / Interim history: 51 year old female with ESRD on TTS dialysis, DM2, HTN, HLD, hypothyroidism, right eye blindness, calciphylaxis of bilateral lower extremities comes into the hospital from Dr. [facility] clinic with new right foot ulcer.  Vascular surgery was consulted on admission and she is scheduled for them angiogram this morning.  Nephrology also consulted, her last HD was on 3/4  Subjective / 24h Interval events: She is doing well today, denies any chest pain, denies any shortness of breath.  Assesement and Plan: Principal Problem:   Ischemic ulcer of right foot ([place]) Active Problems:   End stage renal disease ([place])   Type 2 diabetes mellitus with complication, without long-term current use of insulin (HCC)   Sacral pressure ulcer   Primary hypothyroidism   Hyperlipidemia   Normocytic anemia   Essential hypertension, benign   Calciphylaxis   Metabolic acidosis, increased anion gap   Assessment and Plan: Principal problem Ischemic ulcer of right foot [facility]) -51 year old female with hx of calciphylaxis of BLE being treated by Dr. [name] who presented with new ischemic ulcer to right 5th metatarsal, she was placed on antibiotics, continue.  Continue to monitor cultures.  Vascular surgery consulted, she will be taken to the Cath Lab today for angiogram.  Continue aspirin, statin.  Active problems End stage renal disease ([place]) -Dialysis TTS and had full session on Saturday.  Nephrology consulted, will get dialyzed either later today or tomorrow morning per nephrology.  Type 2 diabetes mellitus with complication, without long-term current use of insulin (HCC) -a1c uncontrolled, 12.2 in 11/2021.  Continue Lantus, sliding scale.  Monitor CBGs  CBG (last 3)  Recent Labs    01/24/22 2244 01/25/22 0800 01/25/22 1212  GLUCAP 345* 206* 142*   Sacral  pressure ulcer -Wound care consult   Primary hypothyroidism - Continue synthroid for now   Hyperlipidemia -Continue lipitor   Normocytic anemia -Baseline of 10-11, continue to monitor  Essential hypertension, benign - monitor  Metabolic acidosis in the setting of ESRD-management per dialysis  Calciphylaxis -Wound care consult for bilateral calves. Currently being treated by dr. [name]   Scheduled Meds:  [facility] Hold] aspirin EC  81 mg Oral Q breakfast   [MAR Hold] atorvastatin  10 mg Oral QHS   [MAR Hold] Chlorhexidine Gluconate Cloth  6 each Topical Q0600   [MAR Hold] ferric citrate  420 mg Oral BID WC   [MAR Hold] insulin aspart  0-6 Units Subcutaneous TID WC   [MAR Hold] insulin glargine-yfgn  5 Units Subcutaneous QHS   [MAR Hold] levothyroxine  50 mcg Oral QAC breakfast   [MAR Hold] pantoprazole  40 mg Oral BID   [MAR Hold] sodium chloride flush  3 mL Intravenous Q12H   [MAR Hold] torsemide  150 mg Oral Daily   [MAR Hold] Vitamin D (Ergocalciferol)  50,000 Units Oral Q14 Days   Continuous Infusions:  [MAR Hold] sodium chloride     [MAR Hold] sodium chloride     [MAR Hold] sodium chloride     [MAR Hold] piperacillin-tazobactam (ZOSYN)  IV 2.25 g (01/25/22 0855)   [MAR Hold] vancomycin     PRN Meds:.[MAR Hold] sodium chloride, [MAR Hold] sodium chloride, [MAR Hold] sodium chloride, [MAR Hold] acetaminophen **OR** [MAR Hold] acetaminophen, [MAR Hold] alteplase, fentaNYL, Heparin (Porcine) in NaCl, [MAR Hold] heparin, iodixanol, [MAR Hold] lactulose, [MAR Hold] lidocaine (PF), lidocaine (PF), [facility]  Hold] lidocaine-prilocaine, midazolam, [MAR Hold] oxyCODONE, [MAR Hold] pentafluoroprop-tetrafluoroeth, [MAR Hold] sodium chloride flush  Diet Orders (From admission, onward)     Start     Ordered   01/25/22 0001  Diet NPO time specified  Diet effective midnight        01/24/22 1955            DVT prophylaxis: SCDs Start: 01/24/22 1854   Lab Results  Component Value Date    PLT 174 01/25/2022      Code Status: Full Code  Family Communication: no family at bedside   Status is: Inpatient  Remains inpatient appropriate because: Severity of illness  Level of care: Med-Surg  Consultants:  Vascular surgery  Nephrology   Procedures:   Microbiology  none  Antimicrobials: Vanc / Zosyn    Objective: Vitals:   01/25/22 0801 01/25/22 1033 01/25/22 1155 01/25/22 1200  BP: 112/70  (!) 90/56 (!) 95/53  Pulse: 71  66 69  Resp: (!) 21  16 15   Temp: 98.3 F (36.8 C)     TempSrc: Oral     SpO2: 100% 100% 92% 94%  Weight:      Height:        Intake/Output Summary (Last 24 hours) at 01/25/2022 1218 Last data filed at 01/24/2022 1746 Gross per 24 hour  Intake 242.63 ml  Output --  Net 242.63 ml   Wt Readings from Last 3 Encounters:  01/24/22 88 kg  12/12/21 88 kg  09/04/21 97.6 kg    Examination:  Constitutional: NAD Eyes: no scleral icterus ENMT: Mucous membranes are moist.  Neck: normal, supple Respiratory: clear to auscultation bilaterally, no wheezing, no crackles.  Cardiovascular: Regular rate and rhythm, no murmurs / rubs / gallops.  Abdomen: non distended, no tenderness. Bowel sounds positive.  Musculoskeletal: no clubbing / cyanosis.  Neurologic: no focal deficits   Data Reviewed: I have independently reviewed following labs and imaging studies  CBC Recent Labs  Lab 01/24/22 1625 01/24/22 1636 01/24/22 2329 01/25/22 1003  WBC 10.6*  --  9.6 8.7  HGB 11.7* 13.6 12.1 10.8*  HCT 37.9 40.0 38.4 34.8*  PLT 201  --  189 174  MCV 88.3  --  87.9 88.1  MCH 27.3  --  27.7 27.3  MCHC 30.9  --  31.5 31.0  RDW 14.3  --  14.1 14.2  LYMPHSABS 1.3  --   --   --   MONOABS 0.7  --   --   --   EOSABS 0.4  --   --   --   BASOSABS 0.1  --   --   --     Recent Labs  Lab 01/24/22 1625 01/24/22 1636 01/24/22 1830 01/24/22 2329 01/25/22 1003  NA 132* 132*  --  135 133*  K 4.6 4.6  --  4.7 4.6  CL 93* 96*  --  95* 96*  CO2 23  --    --  23 26  GLUCOSE 395* 392*  --  354* 204*  BUN 48* 48*  --  53* 56*  CREATININE 5.59* 5.80*  --  5.82* 6.01*  CALCIUM 9.2  --   --  9.4 8.8*  AST 11*  --   --   --   --   ALT 10  --   --   --   --   ALKPHOS 142*  --   --   --   --   BILITOT 0.9  --   --   --   --  ALBUMIN 3.3*  --   --   --  2.8*  LATICACIDVEN  --   --   --  1.7  --   TSH  --   --  3.109  --   --     ------------------------------------------------------------------------------------------------------------------ No results for input(s): CHOL, HDL, LDLCALC, TRIG, CHOLHDL, LDLDIRECT in the last 72 hours.  Lab Results  Component Value Date   HGBA1C 12.2 (H) 12/10/2021   ------------------------------------------------------------------------------------------------------------------ Recent Labs    01/24/22 1830  TSH 3.109    Cardiac Enzymes No results for input(s): CKMB, TROPONINI, MYOGLOBIN in the last 168 hours.  Invalid input(s): CK ------------------------------------------------------------------------------------------------------------------    Component Value Date/Time   BNP 1,068.9 (H) 03/30/2021 1658    CBG: Recent Labs  Lab 01/24/22 2019 01/24/22 2120 01/24/22 2244 01/25/22 0800 01/25/22 1212  GLUCAP 393* 432* 345* 206* 142*    Recent Results (from the past 240 hour(s))  Resp Panel by RT-PCR (Flu A&B, Covid) Nasopharyngeal Swab     Status: None   Collection Time: 01/24/22  1:34 PM   Specimen: Nasopharyngeal Swab; Nasopharyngeal(NP) swabs in vial transport medium  Result Value Ref Range Status   SARS Coronavirus 2 by RT PCR NEGATIVE NEGATIVE Final    Comment: (NOTE) SARS-CoV-2 target nucleic acids are NOT DETECTED.  The SARS-CoV-2 RNA is generally detectable in upper respiratory specimens during the acute phase of infection. The lowest concentration of SARS-CoV-2 viral copies this assay can detect is 138 copies/mL. A negative result does not preclude SARS-Cov-2 infection and  should not be used as the sole basis for treatment or other patient management decisions. A negative result may occur with  improper specimen collection/handling, submission of specimen other than nasopharyngeal swab, presence of viral mutation(s) within the areas targeted by this assay, and inadequate number of viral copies(<138 copies/mL). A negative result must be combined with clinical observations, patient history, and epidemiological information. The expected result is Negative.  Fact Sheet for Patients:  EntrepreneurPulse.com.au  Fact Sheet for Healthcare Providers:  IncredibleEmployment.be  This test is no t yet approved or cleared by the [place] FDA and  has been authorized for detection and/or diagnosis of SARS-CoV-2 by FDA under an Emergency Use Authorization (EUA). This EUA will remain  in effect (meaning this test can be used) for the duration of the COVID-19 declaration under Section 564(b)(1) of the Act, 21 U.S.C.section 360bbb-3(b)(1), unless the authorization is terminated  or revoked sooner.       Influenza A by PCR NEGATIVE NEGATIVE Final   Influenza B by PCR NEGATIVE NEGATIVE Final    Comment: (NOTE) The Xpert Xpress SARS-CoV-2/FLU/RSV plus assay is intended as an aid in the diagnosis of influenza from Nasopharyngeal swab specimens and should not be used as a sole basis for treatment. Nasal washings and aspirates are unacceptable for Xpert Xpress SARS-CoV-2/FLU/RSV testing.  Fact Sheet for Patients: EntrepreneurPulse.com.au  Fact Sheet for Healthcare Providers: IncredibleEmployment.be  This test is not yet approved or cleared by the [place] FDA and has been authorized for detection and/or diagnosis of SARS-CoV-2 by FDA under an Emergency Use Authorization (EUA). This EUA will remain in effect (meaning this test can be used) for the duration of the COVID-19 declaration  under Section 564(b)(1) of the Act, 21 U.S.C. section 360bbb-3(b)(1), unless the authorization is terminated or revoked.  Performed at [facility], [place] [address], [place], [place] [address]      Radiology Studies: PERIPHERAL VASCULAR CATHETERIZATION  Result Date: 01/25/2022 Patient name: Verdine  Judice MRN: [id number] DOB: 02-10-1971 Sex: female 01/25/2022 Pre-operative Diagnosis: Right leg ulcer Post-operative diagnosis:  Same Surgeon:  Annamarie Major Procedure Performed:  1.  Ultrasound-guided access, left femoral artery  2.  Abdominal aortogram  3.  Bilateral lower extremity runoff  4.  Third-order catheterization  5.  Conscious sedation, 36 minutes  6.  Closure device, Celt  Indications: This is a 51 year old female with end-stage renal disease on dialysis with a new right foot ulcer in the setting of calciphylaxis.  She is here today for angiographic evaluation. Procedure:  The patient was identified in the holding area and taken to room 8.  The patient was then placed supine on the table and prepped and draped in the usual sterile fashion.  A time out was called.  Conscious sedation was administered with the use of IV fentanyl and Versed under continuous physician and nurse monitoring.  Heart rate, blood pressure, and oxygen saturation were continuously monitored.  Total sedation time was 36 minutes.  Ultrasound was used to evaluate the left common femoral artery.  It was patent .  A digital ultrasound image was acquired.  A micropuncture needle was used to access the left common femoral artery under ultrasound guidance.  An 018 wire was advanced without resistance and a micropuncture sheath was placed.  The 018 wire was removed and a benson wire was placed.  The micropuncture sheath was exchanged for a 5 french sheath.  An omniflush catheter was advanced over the wire to the level of L-1.  An abdominal angiogram was obtained.  Next the Omni Flush catheter was pulled out of the aortic  bifurcation and bilateral runoff was performed.  I then crossed the aortic bifurcation with a Omni Flush cath and a Glidewire.  The catheter was positioned into the right superficial femoral artery and additional images were obtained.  A Celt was used for closure Findings:  Aortogram: No obvious renal stenosis identified.  The infrarenal abdominal aorta is widely patent.  Bilateral common and external iliac arteries are widely patent.  Right Lower Extremity: Right common femoral profundofemoral and superficial femoral artery are widely patent.  The popliteal artery is widely patent.  There is two-vessel runoff via the peroneal and posterior tibial artery.  There is diffuse disease of the small vessels in the foot  Left Lower Extremity: Left common femoral profundofemoral and superficial femoral artery are widely patent.  The popliteal artery is widely patent.  The peroneal artery is the dominant runoff.  There is reconstitution of the posterior tibial from peroneal collaterals at the ankle.  Anterior tibial artery appears to become diminutive in the lower leg Intervention: None Impression:  #1  No evidence of inflow or outflow stenosis  #2  On the right, there is two-vessel runoff via the peroneal and posterior tibial artery without any lesions amenable to intervention  #3  Dominant runoff on the left is the peroneal artery  V. Annamarie Major, M.D., FACS [facility] Office: [phone] Pager:   [phone]     Marzetta Board, MD, PhD [facility]  Between 7 am - 7 pm I am available, please contact me via Amion (for emergencies) or Securechat (non urgent messages)  Between 7 pm - 7 am I am not available, please contact night coverage MD/APP via Amion

## 2022-01-25 NOTE — Telephone Encounter (Signed)
FYI ?Spoke with Silva Bandy with [employer] advised she will send a request for the 01/03/2022 office note and yesterdays visit to support the need for patient to receive the air mattress. The number to contact Silva Bandy if needed is [phone] ?

## 2022-01-26 ENCOUNTER — Encounter: Payer: PRIVATE HEALTH INSURANCE | Admitting: Vascular Surgery

## 2022-01-26 ENCOUNTER — Ambulatory Visit: Payer: Medicare Other

## 2022-01-26 ENCOUNTER — Encounter (HOSPITAL_COMMUNITY): Payer: Self-pay | Admitting: Surgery

## 2022-01-26 DIAGNOSIS — E785 Hyperlipidemia, unspecified: Secondary | ICD-10-CM

## 2022-01-26 DIAGNOSIS — E118 Type 2 diabetes mellitus with unspecified complications: Secondary | ICD-10-CM

## 2022-01-26 DIAGNOSIS — E039 Hypothyroidism, unspecified: Secondary | ICD-10-CM

## 2022-01-26 DIAGNOSIS — I10 Essential (primary) hypertension: Secondary | ICD-10-CM

## 2022-01-26 DIAGNOSIS — D649 Anemia, unspecified: Secondary | ICD-10-CM

## 2022-01-26 DIAGNOSIS — E8729 Other acidosis: Secondary | ICD-10-CM

## 2022-01-26 DIAGNOSIS — N186 End stage renal disease: Secondary | ICD-10-CM

## 2022-01-26 DIAGNOSIS — E669 Obesity, unspecified: Secondary | ICD-10-CM

## 2022-01-26 LAB — BASIC METABOLIC PANEL
Anion gap: 11 (ref 5–15)
BUN: 32 mg/dL — ABNORMAL HIGH (ref 6–20)
CO2: 26 mmol/L (ref 22–32)
Calcium: 8.4 mg/dL — ABNORMAL LOW (ref 8.9–10.3)
Chloride: 99 mmol/L (ref 98–111)
Creatinine, Ser: 4.74 mg/dL — ABNORMAL HIGH (ref 0.44–1.00)
GFR, Estimated: 11 mL/min — ABNORMAL LOW (ref >60)
Glucose, Bld: 236 mg/dL — ABNORMAL HIGH (ref 70–99)
Potassium: 3.7 mmol/L (ref 3.5–5.1)
Sodium: 136 mmol/L (ref 135–145)

## 2022-01-26 LAB — LIPID PANEL
Cholesterol: 85 mg/dL (ref 0–200)
HDL: 25 mg/dL — ABNORMAL LOW (ref >40)
LDL Cholesterol: 40 mg/dL (ref 0–99)
Total CHOL/HDL Ratio: 3.4 RATIO
Triglycerides: 98 mg/dL (ref <150)
VLDL: 20 mg/dL (ref 0–40)

## 2022-01-26 LAB — CBC
HCT: 34.2 % — ABNORMAL LOW (ref 36.0–46.0)
Hemoglobin: 10.8 g/dL — ABNORMAL LOW (ref 12.0–15.0)
MCH: 27.8 pg (ref 26.0–34.0)
MCHC: 31.6 g/dL (ref 30.0–36.0)
MCV: 87.9 fL (ref 80.0–100.0)
Platelets: 170 10*3/uL (ref 150–400)
RBC: 3.89 MIL/uL (ref 3.87–5.11)
RDW: 14.4 % (ref 11.5–15.5)
WBC: 9.6 10*3/uL (ref 4.0–10.5)
nRBC: 0 % (ref 0.0–0.2)

## 2022-01-26 LAB — GLUCOSE, CAPILLARY
Glucose-Capillary: 225 mg/dL — ABNORMAL HIGH (ref 70–99)
Glucose-Capillary: 311 mg/dL — ABNORMAL HIGH (ref 70–99)

## 2022-01-26 LAB — HEPATITIS B SURFACE ANTIBODY, QUANTITATIVE: Hep B S AB Quant (Post): <3.1 m[IU]/mL — ABNORMAL LOW (ref >9.9)

## 2022-01-26 MED ORDER — OXYCODONE HCL 5 MG PO TABS: 5.0000 mg | ORAL_TABLET | Freq: Four times a day (QID) | ORAL | 0 refills | Status: DC | PRN

## 2022-01-26 MED ORDER — VANCOMYCIN HCL IN DEXTROSE 1-5 GM/200ML-% IV SOLN: 1000.0000 mg | INTRAVENOUS | Status: DC

## 2022-01-26 MED ORDER — PENTOXIFYLLINE ER 400 MG PO TBCR
400.0000 mg | EXTENDED_RELEASE_TABLET | Freq: Three times a day (TID) | ORAL | Status: DC
  Filled 2022-01-26: qty 1

## 2022-01-26 MED ORDER — DOXYCYCLINE HYCLATE 100 MG PO TABS: 100.0000 mg | ORAL_TABLET | Freq: Two times a day (BID) | ORAL | 0 refills | Status: AC

## 2022-01-26 MED ORDER — INSULIN GLARGINE-YFGN 100 UNIT/ML ~~LOC~~ SOLN
10.0000 [IU] | Freq: Every day | SUBCUTANEOUS | Status: DC
  Filled 2022-01-26: qty 0.1

## 2022-01-26 MED ORDER — DOXYCYCLINE HYCLATE 100 MG PO TABS
100.0000 mg | ORAL_TABLET | Freq: Two times a day (BID) | ORAL | Status: DC
  Administered 2022-01-26: 100 mg via ORAL
  Filled 2022-01-26: qty 1

## 2022-01-26 MED ORDER — VANCOMYCIN HCL IN DEXTROSE 1-5 GM/200ML-% IV SOLN
1000.0000 mg | Freq: Once | INTRAVENOUS | Status: AC
  Administered 2022-01-26: 1000 mg via INTRAVENOUS
  Filled 2022-01-26: qty 200

## 2022-01-26 MED ORDER — PENTOXIFYLLINE ER 400 MG PO TBCR: 400.0000 mg | EXTENDED_RELEASE_TABLET | Freq: Three times a day (TID) | ORAL | 0 refills | Status: DC

## 2022-01-26 NOTE — Progress Notes (Addendum)
Inpatient Diabetes Program Recommendations ? ?AACE/ADA: New Consensus Statement on Inpatient Glycemic Control (2015) ? ?Target Ranges:  Prepandial:   less than 140 mg/dL ?     Peak postprandial:   less than 180 mg/dL (1-2 hours) ?     Critically ill patients:  140 - 180 mg/dL  ? ?Lab Results  ?Component Value Date  ? GLUCAP 311 (H) 01/26/2022  ? HGBA1C 12.2 (H) 12/10/2021  ? ? ?Review of Glycemic Control ? Latest Reference Range & Units 01/25/22 12:12 01/25/22 17:40 01/25/22 22:04 01/26/22 06:24 01/26/22 11:15  ?Glucose-Capillary 70 - 99 mg/dL 142 (H) 182 (H) 261 (H) 225 (H) 311 (H)  ? ?Diabetes history: DM 2 ?Outpatient Diabetes medications:  ?Lantus 8 units q HS, Humalog 5-15 units tid with meals  ?Current orders for Inpatient glycemic control:  ?Novolog 0-6 units tid with meals ?Semglee 5 units q HS ? ?Inpatient Diabetes Program Recommendations:   ? ?Consider increasing Semglee to 10 units q HS and add Novolog 2 units tid with meals (hold if patient eats less than 50%).   ? ?Thanks,  ?Adah Perl, RN, BC-ADM ?Inpatient Diabetes Coordinator ?Pager [phone]  (8a-5p) ? ? ?Addendum 1420- Spoke to patient regarding home DM management.  She states that blood sugars have been "high" at home.  I encouraged patient to f/u with PCP regarding blood sugar management. She states she also has appointment with endocrinologist this month.  Patient to be discharged today.   ? ?

## 2022-01-26 NOTE — Discharge Summary (Addendum)
Physician Discharge Summary   Patient: Amy Moses MRN: [id number] DOB: [birth year]  Admit date:     01/24/2022  Discharge date: 01/26/22  Discharge Physician: Amy Merles, DO   PCP: Amy Pont, DO   Recommendations at discharge:    Follow up with orthopedic surgery Dr. Lajoyce Moses and wound care center Follow-up with PCP within 1 to 2 weeks for CBC, CMP, mag, Phos within 1 week Follow-up with vascular surgery as needed  Discharge Diagnoses: Principal Problem:   Ischemic ulcer of right foot (HCC) Active Problems:   End stage renal disease (HCC)   Type 2 diabetes mellitus with complication, without long-term current use of insulin (HCC)   Sacral pressure ulcer   Primary hypothyroidism   Hyperlipidemia   Normocytic anemia   Essential hypertension, benign   Calciphylaxis   Metabolic acidosis, increased anion gap   Obesity (BMI 30-39.9)  Resolved Problems:   Blindness  Hospital Course: The patient is a 51 year old female with ESRD on TTS dialysis, DM2, HTN, HLD, hypothyroidism, right eye blindness, calciphylaxis of bilateral lower extremities as well as other comorbidites comes into the hospital from Dr. Audrie Moses clinic with new right foot ulcer.  Vascular surgery was consulted on admission and she was scheduled for them angiogram which showed no evidence of inflow or outflow stenosis and on the right there is two-vessel runoff with the parol/PT without any lesions amenable to intervention and the dominant runoff on the left with the peroneal artery.  Nephrology also consulted, her last HD was on 3/4 and she underwent hemodialysis while she was here.  Transfer changed to p.o. and she improved.  The orthopedic surgeon recommended that she leave with Kerlix and Ace wraps on both extremities and follow-up in his office to apply 3 layer compression wrap.  Dr. Lajoyce Moses recommended Trental prior to discharge given that she has more of a microcirculatory problem.  She was deemed medically stable and  PT OT felt that she was at baseline.  She will need to follow-up with her PCP as well as orthopedic surgery for her continued wound care in outpatient setting.  Assessment and Plan: * Ischemic ulcer of right foot [facility]) -51 year old female with hx of calciphylaxis of BLE being treated by Dr. Lajoyce Moses who presented with  New ischemic ulcer to right 5th metatarsal  -admit to med-surg -vascular has already seen with plans for angiography but had no targetable lesions -Continue aspirin and orthopedic surgery recommended adding Trental prior to discharge -continue statin -ABIs ordered but she underwent a angiogram -continue IV abx with vanc/zosyn while hospitalized and changed to p.o. doxycycline for 7 days -check blood cultures/lactic acid. No SIRS/sepsis criteria  -wound care consult and appreciate Dr. Lajoyce Moses evaluating -Follow-up with Dr. Lajoyce Moses in outpatient setting as she has been stable from his perspective  End stage renal disease [facility]) -Dialysis TTS and had full session on Saturday -Nephrology consulted for routine dialysis and she was dialyzed 01/26/2022 with next planned dialysis on 01/28/2022 No signs of volume overload or need for emergent dialysis  -Continue auryxia and torsemide care per nephrology  Type 2 diabetes mellitus with complication, without long-term current use of insulin (HCC) -a1c uncontrolled, a1c of 12.2 in 11/2021  Continue lantus 8 units qhs, likely needs titrated (decreased to 5 units since NPO at midnight) SSI and accuchecks per protocol  -Resume home medications and appreciate diabetes education coordinator recommendation -CBGs here ranging from 142-311   Sacral pressure ulcer Wound care consult and continue outpatient follow-up  Primary hypothyroidism -  TSH in 2021 was >6, repeat tsh was 3.109 -Continue synthroid for now repeat thyroid function test in outpatient setting in 4 to 6 weeks  Hyperlipidemia -Continue lipitor   Normocytic anemia -Baseline of  10-11 -Appears to be at baseline as hemoglobin/hematocrit was 10.8/34.2 -Continue to monitor and trend in outpatient setting  Essential hypertension, benign -Blood pressure very well controlled On no medication, monitor   Obesity (BMI 30-39.9) -Complicates overall prognosis and care -Estimated body mass index is 31.31 kg/m as calculated from the following:   Height as of this encounter: 5\' 6"  (1.676 m).   Weight as of this encounter: 88 kg.  -Weight Loss and Dietary Counseling given   Metabolic acidosis, increased anion gap -She has increased AG, but likely secondary to renal disease as her AG has been around 16 -BS 395, check vbg/beta hydroxybutyrate acid with foot ulcer to make sure not mild DKA -Will give novolog now (has hx of labile BS) -SSI and lantus have been ordered.  -Her anion gap closed and CO2 was 26, chloride level was 99 and anion gap was 11 -Follow-up within 1 to 2 weeks  Calciphylaxis -Wound care consult for bilateral calves. Currently being treated by dr. Lajoyce Moses we will continue following him with him in the outpatient setting   Active Pressure Injury/Wound(s)     Pressure Ulcer  Duration          Pressure Injury 09/03/21 Thigh Anterior;Distal;Left 145 days   Pressure Injury 01/24/22 Buttocks Bilateral Stage 3 -  Full thickness tissue loss. Subcutaneous fat may be visible but bone, tendon or muscle are NOT exposed. 1 day            Pain control - [employer] Controlled Substance Reporting System database was reviewed. and patient was instructed, not to drive, operate heavy machinery, perform activities at heights, swimming or participation in water activities or provide baby-sitting services while on Pain, Sleep and Anxiety Medications; until their outpatient Physician has advised to do so again. Also recommended to not to take more than prescribed Pain, Sleep and Anxiety Medications.  Consultants: Nephrology, Vascular Surgery Procedures performed:  Angiogram  Disposition: Home Diet recommendation:  Discharge Diet Orders (From admission, onward)     Start     Ordered   01/26/22 0000  Diet - low sodium heart healthy        01/26/22 1329           Renal diet DISCHARGE MEDICATION: Allergies as of 01/26/2022       Reactions   Ace Inhibitors Cough        Medication List     TAKE these medications    acetaminophen 500 MG tablet Commonly known as: TYLENOL Take 1,000 mg by mouth every 6 (six) hours as needed for moderate pain.   aspirin EC 81 MG tablet Take 1 tablet (81 mg total) by mouth daily with breakfast.   atorvastatin 10 MG tablet Commonly known as: LIPITOR Take 1 tablet (10 mg total) by mouth daily. What changed: when to take this   Auryxia 1 GM 210 MG(Fe) tablet Generic drug: ferric citrate Take 420 mg by mouth 2 (two) times daily with a meal.   doxycycline 100 MG tablet Commonly known as: VIBRA-TABS Take 1 tablet (100 mg total) by mouth every 12 (twelve) hours for 7 days.   HumaLOG KwikPen 100 UNIT/ML KwikPen Generic drug: insulin lispro Inject 2 Units into the skin 3 (three) times daily with meals. If eats 50% or more of meal.  What changed:  how much to take additional instructions   insulin glargine 100 UNIT/ML injection Commonly known as: LANTUS Inject 0.08 mLs (8 Units total) into the skin at bedtime.   lactulose 10 GM/15ML solution Commonly known as: CHRONULAC Take 10 g by mouth daily as needed for moderate constipation.   levothyroxine 50 MCG tablet Commonly known as: SYNTHROID Take 1 tablet (50 mcg total) by mouth daily before breakfast.   oxyCODONE 5 MG immediate release tablet Commonly known as: Oxy IR/ROXICODONE Take 1 tablet (5 mg total) by mouth every 6 (six) hours as needed for moderate pain.   pantoprazole 40 MG tablet Commonly known as: Protonix Take 1 tablet (40 mg total) by mouth 2 (two) times daily.   pentoxifylline 400 MG CR tablet Commonly known as: TRENTAL Take 1  tablet (400 mg total) by mouth 3 (three) times daily with meals.   torsemide 100 MG tablet Commonly known as: DEMADEX Take 150 mg by mouth daily.   Vitamin D (Ergocalciferol) 1.25 MG (50000 UNIT) Caps capsule Commonly known as: DRISDOL Take 50,000 Units by mouth every 14 (fourteen) days.               Discharge Care Instructions  (From admission, onward)           Start     Ordered   01/26/22 0000  Discharge wound care:       Comments: Daily: Wound care to bilateral ischial tuberosity chronic, nonhealing Stage 4 pressure injuries and left medial thigh small, tracking, full thickness wound:  Cleanse with NS, pat gently dry. Cover bilateral IT wounds (L>R) with size appropriate piece of silver hydrofiber (Aquacel Ag+ Advantage, [employer] # [id number]), top with dry gauze 2x2 and secure with silicone foam dressing. For the left medial thigh, cut a thin strip of Aquacel Ag+ Advantage, moisten and insert into wound. Top with silicone foam. Change daily.   01/26/22 1329            Follow-up Information     Nadara Mustard, MD Follow up in 1 day(s).   Specialty: Orthopedic Surgery Why: Call office at time of discharge to schedule an appointment in a day or 2. Contact information: [address] [place] [address] [phone]                Discharge Exam: [name] Weights   01/24/22 1359 01/25/22 2353  Weight: 88 kg 88 kg   Vitals:   01/26/22 0802 01/26/22 1114  BP: (!) 96/59 121/69  Pulse: 76 78  Resp: 19 17  Temp: 98.3 F (36.8 C) 98.6 F (37 C)  SpO2: 93% 93%   Constitutional: WN/WD obese African-American female currently in no acute distress appears calm Respiratory: Diminished to auscultation bilaterally with coarse breath sounds, no wheezing, rales, rhonchi or crackles. Normal respiratory effort and patient is not tachypenic. No accessory muscle use.  Cardiovascular: RRR, no murmurs / rubs / gallops. S1 and S2 auscultated.  Has some trace extremity  edema Abdomen: Soft, non-tender, distended secondary body habitus. Bowel sounds positive.  GU: Deferred. Musculoskeletal: No clubbing / cyanosis of digits/nails. No joint deformity upper and lower extremities.  Skin: Has a right lateral foot ulcer and multiple areas of discoloration given her prior wounds  Condition at discharge: stable  The results of significant diagnostics from this hospitalization (including imaging, microbiology, ancillary and laboratory) are listed below for reference.   Imaging Studies: PERIPHERAL VASCULAR CATHETERIZATION  Result Date: 01/25/2022 Patient name: Lilybeth Ernsberger MRN: [id number] DOB:  1971-05-29 Sex: female 01/25/2022 Pre-operative Diagnosis: Right leg ulcer Post-operative diagnosis:  Same Surgeon:  Durene Cal Procedure Performed:  1.  Ultrasound-guided access, left femoral artery  2.  Abdominal aortogram  3.  Bilateral lower extremity runoff  4.  Third-order catheterization  5.  Conscious sedation, 36 minutes  6.  Closure device, Celt  Indications: This is a 51 year old female with end-stage renal disease on dialysis with a new right foot ulcer in the setting of calciphylaxis.  She is here today for angiographic evaluation. Procedure:  The patient was identified in the holding area and taken to room 8.  The patient was then placed supine on the table and prepped and draped in the usual sterile fashion.  A time out was called.  Conscious sedation was administered with the use of IV fentanyl and Versed under continuous physician and nurse monitoring.  Heart rate, blood pressure, and oxygen saturation were continuously monitored.  Total sedation time was 36 minutes.  Ultrasound was used to evaluate the left common femoral artery.  It was patent .  A digital ultrasound image was acquired.  A micropuncture needle was used to access the left common femoral artery under ultrasound guidance.  An 018 wire was advanced without resistance and a micropuncture sheath was placed.   The 018 wire was removed and a benson wire was placed.  The micropuncture sheath was exchanged for a 5 french sheath.  An omniflush catheter was advanced over the wire to the level of L-1.  An abdominal angiogram was obtained.  Next the Omni Flush catheter was pulled out of the aortic bifurcation and bilateral runoff was performed.  I then crossed the aortic bifurcation with a Omni Flush cath and a Glidewire.  The catheter was positioned into the right superficial femoral artery and additional images were obtained.  A Celt was used for closure Findings:  Aortogram: No obvious renal stenosis identified.  The infrarenal abdominal aorta is widely patent.  Bilateral common and external iliac arteries are widely patent.  Right Lower Extremity: Right common femoral profundofemoral and superficial femoral artery are widely patent.  The popliteal artery is widely patent.  There is two-vessel runoff via the peroneal and posterior tibial artery.  There is diffuse disease of the small vessels in the foot  Left Lower Extremity: Left common femoral profundofemoral and superficial femoral artery are widely patent.  The popliteal artery is widely patent.  The peroneal artery is the dominant runoff.  There is reconstitution of the posterior tibial from peroneal collaterals at the ankle.  Anterior tibial artery appears to become diminutive in the lower leg Intervention: None Impression:  #1  No evidence of inflow or outflow stenosis  #2  On the right, there is two-vessel runoff via the peroneal and posterior tibial artery without any lesions amenable to intervention  #3  Dominant runoff on the left is the peroneal artery  V. Durene Cal, M.D., FACS [facility] Office: [phone] Pager:   [phone]    Microbiology: Results for orders placed or performed during the hospital encounter of 01/24/22  Resp Panel by RT-PCR (Flu A&B, Covid) Nasopharyngeal Swab     Status: None   Collection Time:  01/24/22  1:34 PM   Specimen: Nasopharyngeal Swab; Nasopharyngeal(NP) swabs in vial transport medium  Result Value Ref Range Status   SARS Coronavirus 2 by RT PCR NEGATIVE NEGATIVE Final    Comment: (NOTE) SARS-CoV-2 target nucleic acids are NOT DETECTED.  The SARS-CoV-2 RNA is generally  detectable in upper respiratory specimens during the acute phase of infection. The lowest concentration of SARS-CoV-2 viral copies this assay can detect is 138 copies/mL. A negative result does not preclude SARS-Cov-2 infection and should not be used as the sole basis for treatment or other patient management decisions. A negative result may occur with  improper specimen collection/handling, submission of specimen other than nasopharyngeal swab, presence of viral mutation(s) within the areas targeted by this assay, and inadequate number of viral copies(<138 copies/mL). A negative result must be combined with clinical observations, patient history, and epidemiological information. The expected result is Negative.  Fact Sheet for Patients:  BloggerCourse.com  Fact Sheet for Healthcare Providers:  SeriousBroker.it  This test is no t yet approved or cleared by the [place] FDA and  has been authorized for detection and/or diagnosis of SARS-CoV-2 by FDA under an Emergency Use Authorization (EUA). This EUA will remain  in effect (meaning this test can be used) for the duration of the COVID-19 declaration under Section 564(b)(1) of the Act, 21 U.S.C.section 360bbb-3(b)(1), unless the authorization is terminated  or revoked sooner.       Influenza A by PCR NEGATIVE NEGATIVE Final   Influenza B by PCR NEGATIVE NEGATIVE Final    Comment: (NOTE) The Xpert Xpress SARS-CoV-2/FLU/RSV plus assay is intended as an aid in the diagnosis of influenza from Nasopharyngeal swab specimens and should not be used as a sole basis for treatment. Nasal washings  and aspirates are unacceptable for Xpert Xpress SARS-CoV-2/FLU/RSV testing.  Fact Sheet for Patients: BloggerCourse.com  Fact Sheet for Healthcare Providers: SeriousBroker.it  This test is not yet approved or cleared by the [place] FDA and has been authorized for detection and/or diagnosis of SARS-CoV-2 by FDA under an Emergency Use Authorization (EUA). This EUA will remain in effect (meaning this test can be used) for the duration of the COVID-19 declaration under Section 564(b)(1) of the Act, 21 U.S.C. section 360bbb-3(b)(1), unless the authorization is terminated or revoked.  Performed at [name], [address] [place], [place] [address]   Culture, blood (routine x 2)     Status: None (Preliminary result)   Collection Time: 01/24/22  6:40 PM   Specimen: BLOOD RIGHT FOREARM  Result Value Ref Range Status   Specimen Description BLOOD RIGHT FOREARM  Final   Special Requests   Final    BOTTLES DRAWN AEROBIC AND ANAEROBIC Blood Culture adequate volume   Culture   Final    NO GROWTH 2 DAYS Performed at [name], [address], [place], [place] [address]    Report Status PENDING  Incomplete  Culture, blood (routine x 2)     Status: None (Preliminary result)   Collection Time: 01/24/22  7:00 PM   Specimen: BLOOD  Result Value Ref Range Status   Specimen Description BLOOD SITE NOT SPECIFIED  Final   Special Requests   Final    BOTTLES DRAWN AEROBIC AND ANAEROBIC Blood Culture adequate volume   Culture   Final    NO GROWTH 2 DAYS Performed at [facility], [address], [place], [place] [address]    Report Status PENDING  Incomplete    Labs: CBC: Recent Labs  Lab 01/24/22 1625 01/24/22 1636 01/24/22 2329 01/25/22 1003 01/26/22 0555  WBC 10.6*  --  9.6 8.7 9.6  NEUTROABS 8.1*  --   --   --   --   HGB 11.7* 13.6 12.1 10.8* 10.8*  HCT 37.9  40.0 38.4 34.8* 34.2*  MCV 88.3  --  87.9  88.1 87.9  PLT 201  --  189 174 170   Basic Metabolic Panel: Recent Labs  Lab 01/24/22 1625 01/24/22 1636 01/24/22 2329 01/25/22 1003 01/26/22 0555  NA 132* 132* 135 133* 136  K 4.6 4.6 4.7 4.6 3.7  CL 93* 96* 95* 96* 99  CO2 23  --  23 26 26   GLUCOSE 395* 392* 354* 204* 236*  BUN 48* 48* 53* 56* 32*  CREATININE 5.59* 5.80* 5.82* 6.01* 4.74*  CALCIUM 9.2  --  9.4 8.8* 8.4*  PHOS  --   --   --  7.7*  --    Liver Function Tests: Recent Labs  Lab 01/24/22 1625 01/25/22 1003  AST 11*  --   ALT 10  --   ALKPHOS 142*  --   BILITOT 0.9  --   PROT 7.0  --   ALBUMIN 3.3* 2.8*   CBG: Recent Labs  Lab 01/25/22 1212 01/25/22 1740 01/25/22 2204 01/26/22 0624 01/26/22 1115  GLUCAP 142* 182* 261* 225* 311*    Discharge time spent: greater than 30 minutes.  Signed: Marguerita Merles, DO [facility] 01/26/2022

## 2022-01-26 NOTE — Telephone Encounter (Signed)
Amy Moses with [employer] called about the power air mattress needed for this pt. She states that medicare will not approve it because in feb and march office notes there was nothing about her wounds. She is in the office until 2 pm everyday if you have any questions.  ? ?CB [phone] ?

## 2022-01-26 NOTE — Progress Notes (Addendum)
Pt receives out-pt HD at [facility] on TTS. Pt arrives at 5:30 for 5:45 chair time. Spoke to [place] at clinic to confirm the above info. Will assist as needed.  ? ?Melven Sartorius ?Renal Navigator ?[phone] ? ?Addendum at 1:57 pm: ?D/C order noted. Contacted [facility] and spoke to [place]. Clinic advised pt for d/c today and will resume care tomorrow. Navigator will fax d/c summary to clinic once available for continuation of care (fax [phone]).  ?

## 2022-01-26 NOTE — Progress Notes (Signed)
Pharmacy Antibiotic Note ? ?Amy Moses is a 51 y.o. female admitted on 01/24/2022 with calciphylaxis of BLE with new ischemic ulcer.  Pharmacy has been consulted for vancomycin and zosyn dosing. Patient is on dialysis TTS ? ?Vancomycin 1g given in the ED 3/9. Last dialysis 3/7-3/8 early in the morning.  ? ?Plan: ?Vancomycin 1g post-HD today then TTS or per nephro ?Zosyn 2.25g q8h ?Monitor cultures, clinical status, HD schedule, vancomycin level ?Narrow abx as able and f/u duration  ? ?Height: 5\' 6"  (167.6 cm) ?Weight: 88 kg (194 lb 0.1 oz) ?IBW/kg (Calculated) : 59.3 ? ?Temp (24hrs), Avg:97.9 ?F (36.6 ?C), Min:97.3 ?F (36.3 ?C), Max:98.3 ?F (36.8 ?C) ? ?Recent Labs  ?Lab 01/24/22 ?1625 01/24/22 ?1636 01/24/22 ?2329 01/25/22 ?1003 01/26/22 ?7169  ?WBC 10.6*  --  9.6 8.7 9.6  ?CREATININE 5.59* 5.80* 5.82* 6.01* 4.74*  ?LATICACIDVEN  --   --  1.7  --   --   ? ?  ?Estimated Creatinine Clearance: 16.2 mL/min (A) (by C-G formula based on SCr of 4.74 mg/dL (H)).   ? ?Allergies  ?Allergen Reactions  ? Ace Inhibitors Cough  ? ? ?Antimicrobials this admission: ?vancomycin 3/6 >>  ?zosyn 3/6 >> ? ?Microbiology results: ?3/6 Bcx ngtd ? ?Thank you for allowing pharmacy to be a part of this patient?s care. ? ?Benetta Spar, PharmD, BCPS, BCCP ?Clinical Pharmacist ? ?Please check AMION for all [place] phone numbers ?After 10:00 PM, call [place] [phone] ? ?

## 2022-01-26 NOTE — Progress Notes (Signed)
PHARMACIST LIPID MONITORING ? ? ?Amy Moses is a 51 y.o. female admitted on 01/24/2022 with calciphylaxis of BLE with new ischemic ulcer s/p angiogram with no evidence of inflow or outflow stenosis.  Pharmacy has been consulted to optimize lipid-lowering therapy with the indication of secondary prevention for clinical ASCVD. ? ?Recent Labs: ? ?Lipid Panel (last 6 months):   ?Lab Results  ?Component Value Date  ? CHOL 85 01/26/2022  ? TRIG 98 01/26/2022  ? HDL 25 (L) 01/26/2022  ? CHOLHDL 3.4 01/26/2022  ? VLDL 20 01/26/2022  ? [place] 40 01/26/2022  ? ? ?Hepatic function panel (last 6 months):   ?Lab Results  ?Component Value Date  ? AST 11 (L) 01/24/2022  ? ALT 10 01/24/2022  ? ALKPHOS 142 (H) 01/24/2022  ? BILITOT 0.9 01/24/2022  ? ? ?SCr (since admission):   ?Serum creatinine: 4.74 mg/dL (H) 01/26/22 0555 ?Estimated creatinine clearance: 16.2 mL/min (A) ? ?Current therapy and lipid therapy tolerance ?Current lipid-lowering therapy: atorvastatin 10mg  ?Previous lipid-lowering therapies (if applicable): n/a ?Documented or reported allergies or intolerances to lipid-lowering therapies (if applicable): none ? ?Assessment:   ?Patient does not appear to have PAD and LDL is below goal anyway.  ? ?Plan:   ? ?1.Statin intensity (high intensity recommended for all patients regardless of the LDL):  No statin changes ? ?2.Add ezetimibe (if any one of the following):   Not indicated at this time. ? ?3.Refer to lipid clinic:   No ? ?4.Follow-up with:  Primary care provider - Emelda Fear, DO ? ?5.Follow-up labs after discharge:  No changes in lipid therapy, repeat a lipid panel in one year.    ? ?Benetta Spar, PharmD, BCPS, BCCP ?Clinical Pharmacist ? ?Please check AMION for all [place] phone numbers ?After 10:00 PM, call [place] [phone] ? ?

## 2022-01-26 NOTE — Progress Notes (Addendum)
?  Progress Note ? ? ? ?01/26/2022 ?6:55 AM ?1 Day Post-Op ? ?Subjective:  no complaints ? ?Afebrile ? ?Vitals:  ? 01/26/22 0308 01/26/22 0335  ?BP: (!) 103/59 (!) 106/59  ?Pulse: 70 74  ?Resp: 16 18  ?Temp:  98.2 ?F (36.8 ?C)  ?SpO2:  97%  ? ? ?Physical Exam: ?General:  no distress ?Lungs:  non labored ?Incisions:  left groin is soft without hematoma ?Extremities:  bilateral legs are bandaged ?Abdomen:  soft ? ?CBC ?   ?Component Value Date/Time  ? WBC 9.6 01/26/2022 0555  ? RBC 3.89 01/26/2022 0555  ? HGB 10.8 (L) 01/26/2022 0555  ? HCT 34.2 (L) 01/26/2022 0555  ? PLT 170 01/26/2022 0555  ? MCV 87.9 01/26/2022 0555  ? MCH 27.8 01/26/2022 0555  ? MCHC 31.6 01/26/2022 0555  ? RDW 14.4 01/26/2022 0555  ? LYMPHSABS 1.3 01/24/2022 1625  ? MONOABS 0.7 01/24/2022 1625  ? EOSABS 0.4 01/24/2022 1625  ? BASOSABS 0.1 01/24/2022 1625  ? ? ?BMET ?   ?Component Value Date/Time  ? NA 133 (L) 01/25/2022 1003  ? K 4.6 01/25/2022 1003  ? CL 96 (L) 01/25/2022 1003  ? CO2 26 01/25/2022 1003  ? GLUCOSE 204 (H) 01/25/2022 1003  ? BUN 56 (H) 01/25/2022 1003  ? CREATININE 6.01 (H) 01/25/2022 1003  ? CALCIUM 8.8 (L) 01/25/2022 1003  ? GFRNONAA 8 (L) 01/25/2022 1003  ? GFRAA 10 (L) 06/11/2020 0336  ? ? ?INR ?   ?Component Value Date/Time  ? INR 1.2 04/23/2021 0626  ? ? ? ?Intake/Output Summary (Last 24 hours) at 01/26/2022 0655 ?Last data filed at 01/26/2022 0431 ?Gross per 24 hour  ?Intake 0 ml  ?Output 2060 ml  ?Net -2060 ml  ? ? ? ?Assessment/Plan:  51 y.o. female is s/p:  ?Procedure Performed: ?           1.  Ultrasound-guided access, left femoral artery ?           2.  Abdominal aortogram ?           3.  Bilateral lower extremity runoff ?           4.  Third-order catheterization ?           5.  Conscious sedation, 36 minutes ?           6.  Closure device, Celt  ?1 Day Post-Op ? ? ?-angiogram with no evidence of inflow or outflow stenosis.  On the right, there was two vessel runoff with pero/PT without any lesions amendable to intervention.   Dominant runoff on the left is peroneal artery.  Wound plan per Dr. [name]. ? ? ? ?Leontine Locket, PA-C ?[facility] ?[phone] ?01/26/2022 ?6:55 AM ? ?I have independently interviewed and examined patient and agree with PA assessment and plan above. Left groin without hematoma, stable left foot wound. Vascular available as needed. ? ?Odell Choung C. Donzetta Matters, MD ?[name] ?Office: [phone] ?Pager: [phone] ? ? ?

## 2022-01-26 NOTE — Progress Notes (Signed)
Patient ID: Amy Moses, female   DOB: Apr 10, 1971, 51 y.o.   MRN: [id number] ?Patient is comfortable today.  I discussed with vascular vein surgery the findings and she seems to have more microcirculatory problem.  Anticipate she can discharge when safe from a medical standpoint.  I have discussed with nursing that she can leave with Curlex and Ace wraps on both lower extremities and when she leaves she will follow-up in my office and we will apply the 3 layer compression wrap. ?

## 2022-01-26 NOTE — Telephone Encounter (Signed)
I called and sw Silva Bandy she states that the pt will have to have a measurement of her sacral wound in the dictation for her up coming appt on 01/31/2022. Once this has been dictated I will fax the note to [phone]. These measurements will need to be documented monthly so the pt can continue with the air mattress. Will hold this message so that Dr. [name] can address at next visit on Monday.   ?

## 2022-01-26 NOTE — Telephone Encounter (Signed)
Tammy faxed records for this pt's can you please call to see what else they are needing for this mattress?  ?

## 2022-01-26 NOTE — Hospital Course (Signed)
The patient is a 51 year old female with ESRD on TTS dialysis, DM2, HTN, HLD, hypothyroidism, right eye blindness, calciphylaxis of bilateral lower extremities as well as other comorbidites comes into the hospital from [facility] with new right foot ulcer.  Vascular surgery was consulted on admission and she was scheduled for them angiogram which showed no evidence of inflow or outflow stenosis and on the right there is two-vessel runoff with the parol/PT without any lesions amenable to intervention and the dominant runoff on the left with the peroneal artery.  Nephrology also consulted, her last HD was on 3/4 and she underwent hemodialysis while she was here.  Transfer changed to p.o. and she improved.  The orthopedic surgeon recommended that she leave with Kerlix and Ace wraps on both extremities and follow-up in his office to apply 3 layer compression wrap.  Dr. [name] recommended Trental prior to discharge given that she has more of a microcirculatory problem.  She was deemed medically stable and PT OT felt that she was at baseline.  She will need to follow-up with her PCP as well as orthopedic surgery for her continued wound care in outpatient setting. ?

## 2022-01-26 NOTE — Progress Notes (Signed)
Pt legs were rewrapped prior to d/c, education complete for d/c, IV removed.  ? ?Chrisandra Carota, RN ?01/26/2022 ? ?

## 2022-01-26 NOTE — TOC Transition Note (Signed)
Transition of Care (TOC) - CM/SW Discharge Note ?Marvetta Gibbons Therapist, sports, BSN ?Transitions of Care ?Unit 4E- RN Case Manager ?See Treatment Team for direct phone #  ? ? ?Patient Details  ?Name: Amy Moses ?MRN: [id number] ?Date of Birth: 1971-10-31 ? ?Transition of Care (TOC) CM/SW Contact:  ?Dahlia Client, Romeo Rabon, RN ?Phone Number: ?01/26/2022, 2:26 PM ? ? ?Clinical Narrative:    ?Pt stable for transition home, Per PT/OT pt at baseline, no needs noted at this time. Per OT pt working with MD office for new wheelchair cushion.  ? ? ? ?Final next level of care: Home/Self Care ?Barriers to Discharge: No Barriers Identified ? ? ?Patient Goals and CMS Choice ?  ?  ?Choice offered to / list presented to : NA ? ?Discharge Placement ?  ?           ? Home ?  ?  ?  ? ?Discharge Plan and Services ?  ?  ?           ?DME Arranged: N/A ?DME Agency: NA ?  ?  ?  ?HH Arranged: NA ?[place] Agency: NA ?  ?  ?  ? ?Social Determinants of Health (SDOH) Interventions ?  ? ? ?Readmission Risk Interventions ?Readmission Risk Prevention Plan 01/26/2022 05/14/2021  ?Transportation Screening Complete Complete  ?PCP or Specialist Appt within 3-5 Days Complete -  ?[place] or Home Care Consult Complete -  ?Social Work Consult for [place] Planning/Counseling Complete -  ?Palliative Care Screening Not Applicable -  ?Medication Review Press photographer) Complete Complete  ?[place] or Home Care Consult - Complete  ?SW Recovery Care/Counseling Consult - Complete  ?Palliative Care Screening - Not Applicable  ?[place] - Not Applicable  ?Some recent data might be hidden  ? ? ? ? ? ?

## 2022-01-26 NOTE — Evaluation (Addendum)
Occupational Therapy Evaluation/Discharge ?Patient Details ?Name: Amy Moses ?MRN: [id number] ?DOB: 10/29/1971 ?Today's Date: 01/26/2022 ? ? ?History of Present Illness Pt is a 51 y/o female who presented with new ulcer of 5th R metatarsal. On 3/6, pt underwent abdominial aortogram of R lower extremity with no stenosis noted and no further surgical intervention needed at this time. PMH: calciphylaxis of both lower extremities, anemia, blindness of R eye, DM, glaucoma, HTN, CKD on HD  ? ?Clinical Impression ?  ?PTA, pt lives with daughter in ramped entrance home. Pt reports Modified Independent with ADLs, basic IADLs and mobility via wheelchair. Pt has been performing scoot transfers only since initial L patellar fx (nonoperative until wounds resolve per pt). Collaborated with pt on ADL routine, IADL strategies with pt already implementing many of these strategies without difficulty. Educated re: pressure relief strategies in bed and in wheelchair, optimal pressure relief wheelchair cushions (currently has foam; needs gel or air cushion), and safe transfer techniques. Pt appears at baseline and declines need for further skilled therapy services at this time. OT to sign off.   ?   ? ?Recommendations for follow up therapy are one component of a multi-disciplinary discharge planning process, led by the attending physician.  Recommendations may be updated based on patient status, additional functional criteria and insurance authorization.  ? ?Follow Up Recommendations ? No OT follow up  ?  ?Assistance Recommended at Discharge Set up Supervision/Assistance  ?Patient can return home with the following Assistance with cooking/housework;Assist for transportation ? ?  ?Functional Status Assessment ? Patient has not had a recent decline in their functional status  ?Equipment Recommendations ? Wheelchair cushion (measurements OT) (would benefit from gel vs roho wheelchair cushion for optimal pressure relief)  ?   ?Recommendations for Other Services   ? ? ?  ?Precautions / Restrictions Precautions ?Precautions: Other (comment) ?Precaution Comments: wounds, hx of L patellar fx ?Restrictions ?Weight Bearing Restrictions: No ?Other Position/Activity Restrictions: but has been self NWB since initial L patellar fx  ? ?  ? ?Mobility Bed Mobility ?Overal bed mobility: Modified Independent ?  ?  ?  ?  ?  ?  ?  ?  ? ?Transfers ?  ?  ?  ?  ?  ?  ?  ?  ?  ?General transfer comment: deferred ?  ? ?  ?Balance   ?  ?  ?  ?  ?  ?  ?  ?  ?  ?  ?  ?  ?  ?  ?  ?  ?  ?  ?   ? ?ADL either performed or assessed with clinical judgement  ? ?ADL Overall ADL's : Modified independent;At baseline ?  ?  ?  ?  ?  ?  ?  ?  ?  ?  ?  ?  ?  ?  ?  ?  ?  ?  ?  ?General ADL Comments: Collaborated on ADL/IADL routine with pt demo good UB strength for baseline scoot transfers to/from wheelchair, ADL completion seated. Reports no concerns though hopeful for new wc/wc cushion. Noted pt with foam cushion; educated on gel or roho cushion for better pressure relief. Educated on strategies for pressure relief in w/c and in bed, including repositioning (pillows for sidelying)  ? ? ? ?Vision Baseline Vision/History: 2 Legally blind ?Ability to See in Adequate Light: 2 Moderately impaired ?Patient Visual Report: No change from baseline ?Vision Assessment?: Vision impaired- to be further tested in functional context ?Additional Comments:  R eye blindness baseline  ?   ?Perception   ?  ?Praxis   ?  ? ?Pertinent Vitals/Pain Pain Assessment ?Pain Assessment: No/denies pain  ? ? ? ?Hand Dominance Right ?  ?Extremity/Trunk Assessment Upper Extremity Assessment ?Upper Extremity Assessment: Overall WFL for tasks assessed ?  ?Lower Extremity Assessment ?Lower Extremity Assessment: Defer to PT evaluation ?  ?Cervical / Trunk Assessment ?Cervical / Trunk Assessment: Normal ?  ?Communication Communication ?Communication: No difficulties ?  ?Cognition Arousal/Alertness:  Awake/alert ?Behavior During Therapy: [facility] for tasks assessed/performed ?Overall Cognitive Status: Within Functional Limits for tasks assessed ?  ?  ?  ?  ?  ?  ?  ?  ?  ?  ?  ?  ?  ?  ?  ?  ?  ?  ?  ?General Comments    ? ?  ?Exercises   ?  ?Shoulder Instructions    ? ? ?Home Living Family/patient expects to be discharged to:: Private residence ?Living Arrangements: Children ?Available Help at Discharge: Family;Available PRN/intermittently (daughter works) ?Type of Home: House ?Home Access: Ramped entrance ?  ?  ?Home Layout: One level ?  ?  ?  ?  ?Bathroom Toilet: Standard ?  ?  ?Home Equipment: Wheelchair - manual;Shower seat;Other (comment) (drop arm BSC; heel offloading boots for in bed) ?  ?  ?  ? ?  ?Prior Functioning/Environment Prior Level of Function : Independent/Modified Independent ?  ?  ?  ?  ?  ?  ?Mobility Comments: use of wheelchair for mobility, scoot transfers at baseline. has not been WB through LE d/t L patellar fx that she is unable to get repaired until wounds resolve d/t high infection risk ?ADLs Comments: Mod I for ADLs from wheelchair level; sponge bathes, transfers to toilet without assist via scoot transer. Able to perform meal prep, access items in kitchen from w/c level ?  ? ?  ?  ?OT Problem List:   ?  ?   ?OT Treatment/Interventions:    ?  ?OT Goals(Current goals can be found in the care plan section) Acute Rehab OT Goals ?Patient Stated Goal: get new w/c & cushion ?OT Goal Formulation: All assessment and education complete, DC therapy  ?OT Frequency:   ?  ? ?Co-evaluation   ?  ?  ?  ?  ? ?  ?AM-PAC OT "6 Clicks" Daily Activity     ?Outcome Measure Help from another person eating meals?: None ?Help from another person taking care of personal grooming?: None ?Help from another person toileting, which includes using toliet, bedpan, or urinal?: None ?Help from another person bathing (including washing, rinsing, drying)?: None ?Help from another person to put on and taking off regular  upper body clothing?: None ?Help from another person to put on and taking off regular lower body clothing?: None ?6 Click Score: 24 ?  ?End of Session Nurse Communication: Mobility status ? ?Activity Tolerance: Patient tolerated treatment well ?Patient left: in bed;with call bell/phone within reach ? ?OT Visit Diagnosis: Other abnormalities of gait and mobility (R26.89)  ?              ?Time: 5409-8119 ?OT Time Calculation (min): 15 min ?Charges:  OT General Charges ?$OT Visit: 1 Visit ?OT Evaluation ?$OT Eval Low Complexity: 1 Low ? ?Malachy Chamber, OTR/L ?Ac[facility] Rehab Services ?Office: [phone]  ? ?Layla Maw ?01/26/2022, 1:37 PM ?

## 2022-01-26 NOTE — Progress Notes (Signed)
PT Cancellation Note ? ?Patient Details ?Name: Amy Moses ?MRN: [id number] ?DOB: 1971-04-02 ? ? ?Cancelled Treatment:    Reason Eval/Treat Not Completed: PT screened, no needs identified, will sign off Per OT, pt at baseline with transfers and no skilled PT needs. Will sign off. If needs change, please re-consult.  ? ?Reuel Derby, PT, DPT  ?Acute Rehabilitation Services  ?Pager: [phone] ?Office: [phone] ? ? ? ?[place] ?01/26/2022, 1:43 PM ?

## 2022-01-26 NOTE — Progress Notes (Signed)
[facility] ROUNDING NOTE   Subjective:   Interval History: This a 51 year old lady with end-stage renal disease secondary to diabetes hypertension hyperlipidemia.  She is followed with Dr. Jess Barters clinic and was brought to the emergency room for new ischemic ulcer on the right fifth metatarsal.  She is being evaluated by vein and vascular surgery.  She underwent arteriogram of the right and left lower extremity and no intervention.  She underwent dialysis 01/26/2022.  This is off schedule her next dialysis would normally be 01/27/2022.  However we will wait till 01/28/2022  Blood pressure 96/59 pulse 96 temperature 98.3 O2 sats 93% room air  Sodium 136 potassium 3.7 chloride 99 CO2 26 BUN 32 creatinine to 4.7 glucose 236 calcium 8.4 hemoglobin 10.1  Objective:  Vital signs in last 24 hours:  Temp:  [97.3 F (36.3 C)-98.3 F (36.8 C)] 98.3 F (36.8 C) (03/08 0802) Pulse Rate:  [63-76] 76 (03/08 0802) Resp:  [13-23] 19 (03/08 0802) BP: (82-130)/(45-74) 96/59 (03/08 0802) SpO2:  [88 %-100 %] 93 % (03/08 0802) Weight:  [88 kg] 88 kg (03/07 2353)  Weight change: 0 kg Filed Weights   01/24/22 1359 01/25/22 2353  Weight: 88 kg 88 kg    Intake/Output: I/O last 3 completed shifts: In: 0  Out: 2060 [Other:2060]   Intake/Output this shift:  Total I/O In: 240 [P.O.:240] Out: -   CVS- RRR no murmurs rubs or gallops RS- CTA no wheeze or rales ABD- BS present soft non-distended EXT- no edema left upper arm AV fistula   Basic Metabolic Panel: Recent Labs  Lab 01/24/22 1625 01/24/22 1636 01/24/22 2329 01/25/22 1003 01/26/22 0555  NA 132* 132* 135 133* 136  K 4.6 4.6 4.7 4.6 3.7  CL 93* 96* 95* 96* 99  CO2 23  --  23 26 26   GLUCOSE 395* 392* 354* 204* 236*  BUN 48* 48* 53* 56* 32*  CREATININE 5.59* 5.80* 5.82* 6.01* 4.74*  CALCIUM 9.2  --  9.4 8.8* 8.4*  PHOS  --   --   --  7.7*  --     Liver Function Tests: Recent Labs  Lab 01/24/22 1625 01/25/22 1003   AST 11*  --   ALT 10  --   ALKPHOS 142*  --   BILITOT 0.9  --   PROT 7.0  --   ALBUMIN 3.3* 2.8*   No results for input(s): LIPASE, AMYLASE in the last 168 hours. No results for input(s): AMMONIA in the last 168 hours.  CBC: Recent Labs  Lab 01/24/22 1625 01/24/22 1636 01/24/22 2329 01/25/22 1003 01/26/22 0555  WBC 10.6*  --  9.6 8.7 9.6  NEUTROABS 8.1*  --   --   --   --   HGB 11.7* 13.6 12.1 10.8* 10.8*  HCT 37.9 40.0 38.4 34.8* 34.2*  MCV 88.3  --  87.9 88.1 87.9  PLT 201  --  189 174 170    Cardiac Enzymes: No results for input(s): CKTOTAL, CKMB, CKMBINDEX, TROPONINI in the last 168 hours.  BNP: Invalid input(s): POCBNP  CBG: Recent Labs  Lab 01/25/22 0800 01/25/22 1212 01/25/22 1740 01/25/22 2204 01/26/22 0624  GLUCAP 206* 142* 182* 261* 225*    Microbiology: Results for orders placed or performed during the hospital encounter of 01/24/22  Resp Panel by RT-PCR (Flu A&B, Covid) Nasopharyngeal Swab     Status: None   Collection Time: 01/24/22  1:34 PM   Specimen: Nasopharyngeal Swab; Nasopharyngeal(NP) swabs in vial transport medium  Result Value Ref Range Status   SARS Coronavirus 2 by RT PCR NEGATIVE NEGATIVE Final    Comment: (NOTE) SARS-CoV-2 target nucleic acids are NOT DETECTED.  The SARS-CoV-2 RNA is generally detectable in upper respiratory specimens during the acute phase of infection. The lowest concentration of SARS-CoV-2 viral copies this assay can detect is 138 copies/mL. A negative result does not preclude SARS-Cov-2 infection and should not be used as the sole basis for treatment or other patient management decisions. A negative result may occur with  improper specimen collection/handling, submission of specimen other than nasopharyngeal swab, presence of viral mutation(s) within the areas targeted by this assay, and inadequate number of viral copies(<138 copies/mL). A negative result must be combined with clinical observations,  patient history, and epidemiological information. The expected result is Negative.  Fact Sheet for Patients:  EntrepreneurPulse.com.au  Fact Sheet for Healthcare Providers:  IncredibleEmployment.be  This test is no t yet approved or cleared by the [place] FDA and  has been authorized for detection and/or diagnosis of SARS-CoV-2 by FDA under an Emergency Use Authorization (EUA). This EUA will remain  in effect (meaning this test can be used) for the duration of the COVID-19 declaration under Section 564(b)(1) of the Act, 21 U.S.C.section 360bbb-3(b)(1), unless the authorization is terminated  or revoked sooner.       Influenza A by PCR NEGATIVE NEGATIVE Final   Influenza B by PCR NEGATIVE NEGATIVE Final    Comment: (NOTE) The Xpert Xpress SARS-CoV-2/FLU/RSV plus assay is intended as an aid in the diagnosis of influenza from Nasopharyngeal swab specimens and should not be used as a sole basis for treatment. Nasal washings and aspirates are unacceptable for Xpert Xpress SARS-CoV-2/FLU/RSV testing.  Fact Sheet for Patients: EntrepreneurPulse.com.au  Fact Sheet for Healthcare Providers: IncredibleEmployment.be  This test is not yet approved or cleared by the [place] FDA and has been authorized for detection and/or diagnosis of SARS-CoV-2 by FDA under an Emergency Use Authorization (EUA). This EUA will remain in effect (meaning this test can be used) for the duration of the COVID-19 declaration under Section 564(b)(1) of the Act, 21 U.S.C. section 360bbb-3(b)(1), unless the authorization is terminated or revoked.  Performed at [facility], [place] [address] [place], [place] [address]   Culture, blood (routine x 2)     Status: None (Preliminary result)   Collection Time: 01/24/22  6:40 PM   Specimen: BLOOD RIGHT FOREARM  Result Value Ref Range Status   Specimen Description BLOOD RIGHT  FOREARM  Final   Special Requests   Final    BOTTLES DRAWN AEROBIC AND ANAEROBIC Blood Culture adequate volume   Culture   Final    NO GROWTH < 24 HOURS Performed at [facility], [place] [address] [place], [place] [address]    Report Status PENDING  Incomplete    Coagulation Studies: No results for input(s): LABPROT, INR in the last 72 hours.  Urinalysis: No results for input(s): COLORURINE, LABSPEC, PHURINE, GLUCOSEU, HGBUR, BILIRUBINUR, KETONESUR, PROTEINUR, UROBILINOGEN, NITRITE, LEUKOCYTESUR in the last 72 hours.  Invalid input(s): APPERANCEUR    Imaging: PERIPHERAL VASCULAR CATHETERIZATION  Result Date: 01/25/2022 Patient name: Gaye Scorza MRN: [id number] DOB: 1970-11-30 Sex: female 01/25/2022 Pre-operative Diagnosis: Right leg ulcer Post-operative diagnosis:  Same Surgeon:  Annamarie Major Procedure Performed:  1.  Ultrasound-guided access, left femoral artery  2.  Abdominal aortogram  3.  Bilateral lower extremity runoff  4.  Third-order catheterization  5.  Conscious sedation, 36 minutes  6.  Closure device, Celt  Indications: This is a 51 year old female with end-stage renal disease on dialysis with a new right foot ulcer in the setting of calciphylaxis.  She is here today for angiographic evaluation. Procedure:  The patient was identified in the holding area and taken to room 8.  The patient was then placed supine on the table and prepped and draped in the usual sterile fashion.  A time out was called.  Conscious sedation was administered with the use of IV fentanyl and Versed under continuous physician and nurse monitoring.  Heart rate, blood pressure, and oxygen saturation were continuously monitored.  Total sedation time was 36 minutes.  Ultrasound was used to evaluate the left common femoral artery.  It was patent .  A digital ultrasound image was acquired.  A micropuncture needle was used to access the left common femoral artery under ultrasound guidance.  An 018 wire was  advanced without resistance and a micropuncture sheath was placed.  The 018 wire was removed and a benson wire was placed.  The micropuncture sheath was exchanged for a 5 french sheath.  An omniflush catheter was advanced over the wire to the level of L-1.  An abdominal angiogram was obtained.  Next the Omni Flush catheter was pulled out of the aortic bifurcation and bilateral runoff was performed.  I then crossed the aortic bifurcation with a Omni Flush cath and a Glidewire.  The catheter was positioned into the right superficial femoral artery and additional images were obtained.  A Celt was used for closure Findings:  Aortogram: No obvious renal stenosis identified.  The infrarenal abdominal aorta is widely patent.  Bilateral common and external iliac arteries are widely patent.  Right Lower Extremity: Right common femoral profundofemoral and superficial femoral artery are widely patent.  The popliteal artery is widely patent.  There is two-vessel runoff via the peroneal and posterior tibial artery.  There is diffuse disease of the small vessels in the foot  Left Lower Extremity: Left common femoral profundofemoral and superficial femoral artery are widely patent.  The popliteal artery is widely patent.  The peroneal artery is the dominant runoff.  There is reconstitution of the posterior tibial from peroneal collaterals at the ankle.  Anterior tibial artery appears to become diminutive in the lower leg Intervention: None Impression:  #1  No evidence of inflow or outflow stenosis  #2  On the right, there is two-vessel runoff via the peroneal and posterior tibial artery without any lesions amenable to intervention  #3  Dominant runoff on the left is the peroneal artery  V. Annamarie Major, M.D., FACS [facility] Office: [phone] Pager:   [phone]     Medications:    sodium chloride 250 mL (01/25/22 1751)   piperacillin-tazobactam (ZOSYN)  IV 2.25 g (01/26/22 0419)    vancomycin     [START ON 01/27/2022] vancomycin      aspirin EC  81 mg Oral Q breakfast   atorvastatin  10 mg Oral QHS   Chlorhexidine Gluconate Cloth  6 each Topical Q0600   ferric citrate  420 mg Oral BID WC   insulin aspart  0-6 Units Subcutaneous TID WC   insulin glargine-yfgn  5 Units Subcutaneous QHS   levothyroxine  50 mcg Oral QAC breakfast   pantoprazole  40 mg Oral BID   sodium chloride flush  3 mL Intravenous Q12H   torsemide  150 mg Oral Daily   Vitamin D (Ergocalciferol)  50,000 Units  Oral Q14 Days   sodium chloride, acetaminophen **OR** acetaminophen, hydrALAZINE, labetalol, lactulose, morphine injection, ondansetron (ZOFRAN) IV, oxyCODONE, sodium chloride flush  Assessment/ Plan:  ESRD-Tuesday Thursday Saturday [facility].  We will plan dialysis 01/28/2022.  She had last dialysis treatment 01/26/2022 ANEMIA-does not appear to be an issue at this time. MBD-continue phosphorus binders and dialysis HTN/VOL-controlled at this point euvolemic Bilateral calciphylaxis followed by Dr. [name].  Now with new ischemic ulcer followed by vascular surgery    LOS: 2 Sherril Croon @TODAY @8 :18 AM

## 2022-01-26 NOTE — Assessment & Plan Note (Signed)
-  Complicates overall prognosis and care ?-Estimated body mass index is 31.31 kg/m? as calculated from the following: ?  Height as of this encounter: 5\' 6"  (1.676 m). ?  Weight as of this encounter: 88 kg.  ?-Weight Loss and Dietary Counseling given ? ?

## 2022-01-27 NOTE — Telephone Encounter (Signed)
PA for pantoprazole has been submitted by form via fax. Waiting on approval/denial.  ?

## 2022-01-28 ENCOUNTER — Ambulatory Visit: Payer: Medicare Other

## 2022-01-29 LAB — CULTURE, BLOOD (ROUTINE X 2)
Culture: NO GROWTH
Culture: NO GROWTH
Special Requests: ADEQUATE
Special Requests: ADEQUATE

## 2022-01-31 ENCOUNTER — Encounter: Payer: Medicare Other | Admitting: Orthopedic Surgery

## 2022-01-31 ENCOUNTER — Telehealth: Payer: Self-pay | Admitting: Orthopedic Surgery

## 2022-01-31 NOTE — Telephone Encounter (Signed)
No need for call back. Opening became available for tomorrow for pt ?

## 2022-01-31 NOTE — Telephone Encounter (Signed)
See next encounter, a spot was available for tomorrow. ?

## 2022-01-31 NOTE — Telephone Encounter (Signed)
[facility] Pantoprazole Sodium TBEC 40 mg from 01/27/2022 --01/28/2023. Pt advised as well as the pt's pharmacy. ?

## 2022-01-31 NOTE — Telephone Encounter (Signed)
Pt called requesting a call from [facility] to open a slot for her tomorrow with Dr. [name]. Pt states they did not set her transportation up for today. Please call pt at [phone]. ?

## 2022-02-01 ENCOUNTER — Encounter: Payer: Self-pay | Admitting: Orthopedic Surgery

## 2022-02-01 ENCOUNTER — Ambulatory Visit (INDEPENDENT_AMBULATORY_CARE_PROVIDER_SITE_OTHER): Payer: Medicare Other | Admitting: Orthopedic Surgery

## 2022-02-01 DIAGNOSIS — L97911 Non-pressure chronic ulcer of unspecified part of right lower leg limited to breakdown of skin: Secondary | ICD-10-CM | POA: Diagnosis not present

## 2022-02-01 DIAGNOSIS — I739 Peripheral vascular disease, unspecified: Secondary | ICD-10-CM

## 2022-02-01 DIAGNOSIS — L98491 Non-pressure chronic ulcer of skin of other sites limited to breakdown of skin: Secondary | ICD-10-CM

## 2022-02-01 DIAGNOSIS — L97923 Non-pressure chronic ulcer of unspecified part of left lower leg with necrosis of muscle: Secondary | ICD-10-CM

## 2022-02-01 NOTE — Progress Notes (Signed)
? ?Office Visit Note ?  ?Patient: Amy Moses           ?Date of Birth: [birth year]           ?MRN: [id number] ?Visit Date: 02/01/2022 ?             ?Requested by: Emelda Fear, DO ?[address] [place] [address] ?PCP: Emelda Fear, DO ? ?Chief Complaint  ?Patient presents with  ? Right Leg - Follow-up  ? Left Leg - Routine Post Op  ?  1/88/41 Kerecis application   ? ? ? ? ?HPI: ?Patient is a 51 year old woman who presents for 2 separate issues.  #1 she is status post skin graft for calciphylaxis bilateral lower extremities with application of Kerecis graft most recently to the left leg on January 20.  Patient also has ischial tuberosity ulcers bilaterally and has been using a air mattress to unload pressure from these ulcers.  Patient states she is currently using Silvadene on the ischial tuberosity ulcers previously was using Hydrofera Blue ? ?Assessment & Plan: ?Visit Diagnoses:  ?1. Calciphylaxis of left lower extremity with nonhealing ulcer with necrosis of muscle ([place])   ?2. Calciphylaxis of right lower extremity with nonhealing ulcer, limited to breakdown of skin ([place])   ?3. PVD (peripheral vascular disease) ([place])   ?4. Ischemic ulcer, limited to breakdown of skin ([place])   ? ? ?Plan: Recommend patient continue with the air mattress to offload the ischial ulcers and continue with Silvadene dressing changes.  Both legs were wrapped with a Dynaflex compression wrap. ? ?Follow-Up Instructions: Return in about 1 week (around 02/08/2022).  ? ?Ortho Exam ? ?Patient is alert, oriented, no adenopathy, well-dressed, normal affect, normal respiratory effort. ?Examination the ischial ulcers are superficial and do not extend to bone or tendon.  The left ischial ulcer is 3 x 4 cm with healthy granulation tissue the right ischial ulcer is 2 x 3 cm.  Examination of both legs the right calf ulcer is 2 x 3 cm with 100% healthy granulation tissue.  The lateral right foot ulcer is showing some improvement but still  has an ischemic center of 1 x 2 cm.  The left calf ulcer is 3 x 15 cm with much improved healthy granulation tissue. ? ?Imaging: ?No results found. ? ? ? ? ?Labs: ?Lab Results  ?Component Value Date  ? HGBA1C 12.2 (H) 12/10/2021  ? HGBA1C 9.5 (H) 08/07/2021  ? HGBA1C 7.3 (H) 03/30/2021  ? ESRSEDRATE 40 (H) 08/07/2021  ? CRP 11.1 (H) 08/07/2021  ? REPTSTATUS 01/29/2022 FINAL 01/24/2022  ? GRAMSTAIN  07/08/2021  ?  RARE WBC PRESENT,BOTH PMN AND MONONUCLEAR ?RARE GRAM POSITIVE RODS ?RARE GRAM POSITIVE COCCI IN PAIRS ?  ? CULT  01/24/2022  ?  NO GROWTH 5 DAYS ?Performed at [facility], [place] [address], [place],  [address] ?  ? LABORGA CITROBACTER KOSERI 05/21/2021  ? [place] 05/21/2021  ? ? ? ?Lab Results  ?Component Value Date  ? ALBUMIN 2.8 (L) 01/25/2022  ? ALBUMIN 3.3 (L) 01/24/2022  ? ALBUMIN 3.2 (L) 12/12/2021  ? PREALBUMIN 13.7 (L) 08/07/2021  ? ? ?Lab Results  ?Component Value Date  ? MG 2.1 05/14/2021  ? MG 2.8 (H) 05/11/2021  ? MG 2.3 04/23/2021  ? ?No results found for: VD25OH ? ?Lab Results  ?Component Value Date  ? PREALBUMIN 13.7 (L) 08/07/2021  ? ?CBC EXTENDED Latest Ref Rng & Units 01/26/2022 01/25/2022 01/24/2022  ?WBC 4.0 - 10.5  K/uL 9.6 8.7 9.6  ?RBC 3.87 - 5.11 MIL/uL 3.89 3.95 4.37  ?HGB 12.0 - 15.0 g/dL 10.8(L) 10.8(L) 12.1  ?HCT 36.0 - 46.0 % 34.2(L) 34.8(L) 38.4  ?PLT 150 - 400 K/uL 170 174 189  ?NEUTROABS 1.7 - 7.7 K/uL - - -  ?LYMPHSABS 0.7 - 4.0 K/uL - - -  ? ? ? ?There is no height or weight on file to calculate BMI. ? ?Orders:  ?No orders of the defined types were placed in this encounter. ? ?No orders of the defined types were placed in this encounter. ? ? ? Procedures: ?No procedures performed ? ?Clinical Data: ?No additional findings. ? ?ROS: ? ?All other systems negative, except as noted in the HPI. ?Review of Systems ? ?Objective: ?Vital Signs: LMP 08/22/2015 (Approximate)  ? ?Specialty Comments:  ?No specialty comments available. ? ?PMFS History: ?Patient  Active Problem List  ? Diagnosis Date Noted  ? Obesity (BMI 30-39.9) 01/26/2022  ? Ischemic ulcer of right foot ([place]) 01/24/2022  ? Normocytic anemia 01/24/2022  ? Sacral pressure ulcer 01/24/2022  ? Metabolic acidosis, increased anion gap 01/24/2022  ? Leg wound, left, sequela 12/10/2021  ? Open leg wound 09/03/2021  ? Wound infection   ? Non-pressure chronic ulcer of right calf limited to breakdown of skin ([place])   ? Calciphylaxis of right lower extremity with nonhealing ulcer, limited to breakdown of skin ([place])   ? Chronic ulcer of left thigh ([place]) 08/07/2021  ? Calciphylaxis of left lower extremity with nonhealing ulcer with necrosis of muscle ([place]) 08/07/2021  ? Lower GI bleed 05/12/2021  ? Acute GI bleeding 04/24/2021  ? Acute blood loss anemia 04/23/2021  ? GI bleed 04/22/2021  ? Fever 03/31/2021  ? Acute on chronic heart failure with preserved ejection fraction (HFpEF) ([place]) 03/30/2021  ? End stage renal disease ([place]) 11/16/2020  ? Calciphylaxis 11/06/2020  ? Non-healing open wound of heel 11/03/2020  ? Diabetic foot infection ([place]) 11/01/2020  ? Decubitus ulcer, heel 11/01/2020  ? Closed nondisplaced fracture of left patella 10/29/2020  ? Metabolic acidosis 63/11/6008  ? Acute on chronic renal failure ([place]) 06/10/2020  ? Acute pericardial effusion 06/10/2020  ? Chronic kidney disease, stage 4 (severe) ([place]) 03/05/2019  ? Vitamin D deficiency 01/28/2019  ? Type 2 diabetes mellitus with complication, without long-term current use of insulin ([place]) 09/21/2015  ? Hyperlipidemia 09/21/2015  ? Essential hypertension, benign 09/21/2015  ? Primary hypothyroidism 09/21/2015  ? Iris bomb? 07/31/2012  ? Secondary angle-closure glaucoma 07/31/2012  ? ?Past Medical History:  ?Diagnosis Date  ? Anemia   ? Blindness of right eye with low vision in contralateral eye   ? s/p victrectomy  ? Diabetes mellitus, type II ([place])   ? Dyslipidemia   ? Glaucoma   ? History of blood transfusion   ? Hypertension   ? Hypothyroidism  (acquired)   ? Kidney disease   ? Stage 5  ? Pneumonia   ?  ?Family History  ?Problem Relation Age of Onset  ? Heart disease Mother   ? Diabetes Mother   ? Kidney disease Mother   ? Diabetes Father   ? Heart disease Father   ? Diabetes Brother   ? Colon cancer Neg Hx   ?  ?Past Surgical History:  ?Procedure Laterality Date  ? ABDOMINAL AORTOGRAM W/LOWER EXTREMITY Bilateral 12/18/2020  ? Procedure: ABDOMINAL AORTOGRAM W/LOWER EXTREMITY;  Surgeon: Elam Dutch, MD;  Location: [facility];  Service: Cardiovascular;  Laterality: Bilateral;  ? ABDOMINAL  AORTOGRAM W/LOWER EXTREMITY Bilateral 01/25/2022  ? Procedure: ABDOMINAL AORTOGRAM W/LOWER EXTREMITY;  Surgeon: Serafina Mitchell, MD;  Location: [facility];  Service: Cardiovascular;  Laterality: Bilateral;  ? ANKLE FRACTURE SURGERY Right   ? AV FISTULA PLACEMENT Left 08/18/2020  ? Procedure: LEFT ARM BRACHIOCEPHALIC ARTERIOVENOUS (AV) FISTULA CREATION;  Surgeon: Elam Dutch, MD;  Location: [facility];  Service: Vascular;  Laterality: Left;  ? BIOPSY  04/24/2021  ? Procedure: BIOPSY;  Surgeon: Eloise Harman, DO;  [facility];  Service: Endoscopy;;  ? CESAREAN SECTION    ? CHOLECYSTECTOMY    ? COLONOSCOPY  04/24/2021  ? Surgeon: Eloise Harman, DO;  nonbleeding internal hemorrhoids, 1 large (25 mm) pedunculated transverse colon polyp (prolapse type polyp) with adherent clot and stigmata of recent bleed.  ? COLONOSCOPY WITH PROPOFOL N/A 05/14/2021  ? Procedure: COLONOSCOPY WITH PROPOFOL;  Surgeon: Daneil Dolin, MD;  [facility];  Service: Endoscopy;  Laterality: N/A;  ? ESOPHAGOGASTRODUODENOSCOPY (EGD) WITH PROPOFOL N/A 04/24/2021  ? Surgeon: Eloise Harman, DO;  duodenal erosions and gastritis biopsied (pathology with peptic duodenitis, reactive gastropathy with erosions/chronic inflammation, negative for H. pylori)  ? EYE SURGERY    ? Vatrectomy  ? HEMOSTASIS CLIP PLACEMENT  05/14/2021  ? Procedure: HEMOSTASIS CLIP  PLACEMENT;  Surgeon: Daneil Dolin, MD;  [facility];  Service: Endoscopy;;  ? IR PERC TUN PERIT CATH WO PORT S&I /IMAG  09/15/2020  ? IR REMOVAL TUN CV CATH W/O FL  02/19/2021  ? IR US GUIDE VASC AC

## 2022-02-01 NOTE — Telephone Encounter (Signed)
Note from today with measurements sent to fax # provided below attn: Silva Bandy ?

## 2022-02-02 ENCOUNTER — Telehealth: Payer: Self-pay | Admitting: Orthopedic Surgery

## 2022-02-02 NOTE — Telephone Encounter (Signed)
02/01/22 ov note faxed to [employer] [phone] ?

## 2022-02-07 ENCOUNTER — Ambulatory Visit (INDEPENDENT_AMBULATORY_CARE_PROVIDER_SITE_OTHER): Payer: Medicare Other | Admitting: Orthopedic Surgery

## 2022-02-07 DIAGNOSIS — L97911 Non-pressure chronic ulcer of unspecified part of right lower leg limited to breakdown of skin: Secondary | ICD-10-CM

## 2022-02-07 DIAGNOSIS — L97923 Non-pressure chronic ulcer of unspecified part of left lower leg with necrosis of muscle: Secondary | ICD-10-CM | POA: Diagnosis not present

## 2022-02-13 ENCOUNTER — Encounter: Payer: Self-pay | Admitting: Orthopedic Surgery

## 2022-02-13 NOTE — Progress Notes (Signed)
? ?Office Visit Note ?  ?Patient: Amy Moses           ?Date of Birth: 08-Nov-1971           ?MRN: [id number] ?Visit Date: 02/07/2022 ?             ?Requested by: Emelda Fear, DO ?[address] [place] [address] ?PCP: Emelda Fear, DO ? ?Chief Complaint  ?Patient presents with  ? Right Leg - Follow-up  ? Left Leg - Routine Post Op  ? ? ? ? ?HPI: ?Patient is a 51 year old woman status post skin graft for calciphylaxis ulcerations of bilateral lower extremities. ? ?Assessment & Plan: ?Visit Diagnoses:  ?1. Calciphylaxis of left lower extremity with nonhealing ulcer with necrosis of muscle ([place])   ?2. Calciphylaxis of right lower extremity with nonhealing ulcer, limited to breakdown of skin ([place])   ? ? ?Plan: Dynaflex wraps applied plus silver cell. ? ?Follow-Up Instructions: Return in about 1 week (around 02/14/2022).  ? ?Ortho Exam ? ?Patient is alert, oriented, no adenopathy, well-dressed, normal affect, normal respiratory effort. ?Examination the calf ulcers continue to improve with the 100% granulation tissue. ? ?Imaging: ?No results found. ? ? ? ?Labs: ?Lab Results  ?Component Value Date  ? HGBA1C 12.2 (H) 12/10/2021  ? HGBA1C 9.5 (H) 08/07/2021  ? HGBA1C 7.3 (H) 03/30/2021  ? ESRSEDRATE 40 (H) 08/07/2021  ? CRP 11.1 (H) 08/07/2021  ? REPTSTATUS 01/29/2022 FINAL 01/24/2022  ? GRAMSTAIN  07/08/2021  ?  RARE WBC PRESENT,BOTH PMN AND MONONUCLEAR ?RARE GRAM POSITIVE RODS ?RARE GRAM POSITIVE COCCI IN PAIRS ?  ? CULT  01/24/2022  ?  NO GROWTH 5 DAYS ?Performed at [facility], [place] [address], [place], [place] [address] ?  ? LABORGA CITROBACTER KOSERI 05/21/2021  ? [place] 05/21/2021  ? ? ? ?Lab Results  ?Component Value Date  ? ALBUMIN 2.8 (L) 01/25/2022  ? ALBUMIN 3.3 (L) 01/24/2022  ? ALBUMIN 3.2 (L) 12/12/2021  ? PREALBUMIN 13.7 (L) 08/07/2021  ? ? ?Lab Results  ?Component Value Date  ? MG 2.1 05/14/2021  ? MG 2.8 (H) 05/11/2021  ? MG 2.3 04/23/2021  ? ?No results found for:  VD25OH ? ?Lab Results  ?Component Value Date  ? PREALBUMIN 13.7 (L) 08/07/2021  ? ? ?  Latest Ref Rng & Units 01/26/2022  ?  5:55 AM 01/25/2022  ? 10:03 AM 01/24/2022  ? 11:29 PM  ?CBC EXTENDED  ?WBC 4.0 - 10.5 K/uL 9.6   8.7   9.6    ?RBC 3.87 - 5.11 MIL/uL 3.89   3.95   4.37    ?Hemoglobin 12.0 - 15.0 g/dL 10.8   10.8   12.1    ?HCT 36.0 - 46.0 % 34.2   34.8   38.4    ?Platelets 150 - 400 K/uL 170   174   189    ? ? ? ?There is no height or weight on file to calculate BMI. ? ?Orders:  ?No orders of the defined types were placed in this encounter. ? ?No orders of the defined types were placed in this encounter. ? ? ? Procedures: ?No procedures performed ? ?Clinical Data: ?No additional findings. ? ?ROS: ? ?All other systems negative, except as noted in the HPI. ?Review of Systems ? ?Objective: ?Vital Signs: LMP 08/22/2015 (Approximate)  ? ?Specialty Comments:  ?No specialty comments available. ? ?PMFS History: ?Patient Active Problem List  ? Diagnosis Date Noted  ? Obesity (BMI 30-39.9) 01/26/2022  ?  Ischemic ulcer of right foot ([place]) 01/24/2022  ? Normocytic anemia 01/24/2022  ? Sacral pressure ulcer 01/24/2022  ? Metabolic acidosis, increased anion gap 01/24/2022  ? Leg wound, left, sequela 12/10/2021  ? Open leg wound 09/03/2021  ? Wound infection   ? Non-pressure chronic ulcer of right calf limited to breakdown of skin ([place])   ? Calciphylaxis of right lower extremity with nonhealing ulcer, limited to breakdown of skin ([place])   ? Chronic ulcer of left thigh ([place]) 08/07/2021  ? Calciphylaxis of left lower extremity with nonhealing ulcer with necrosis of muscle ([place]) 08/07/2021  ? Lower GI bleed 05/12/2021  ? Acute GI bleeding 04/24/2021  ? Acute blood loss anemia 04/23/2021  ? GI bleed 04/22/2021  ? Fever 03/31/2021  ? Acute on chronic heart failure with preserved ejection fraction (HFpEF) ([place]) 03/30/2021  ? End stage renal disease ([place]) 11/16/2020  ? Calciphylaxis 11/06/2020  ? Non-healing open wound of heel  11/03/2020  ? Diabetic foot infection ([place]) 11/01/2020  ? Decubitus ulcer, heel 11/01/2020  ? Closed nondisplaced fracture of left patella 10/29/2020  ? Metabolic acidosis 02/63/7858  ? Acute on chronic renal failure ([place]) 06/10/2020  ? Acute pericardial effusion 06/10/2020  ? Chronic kidney disease, stage 4 (severe) ([place]) 03/05/2019  ? Vitamin D deficiency 01/28/2019  ? Type 2 diabetes mellitus with complication, without long-term current use of insulin ([place]) 09/21/2015  ? Hyperlipidemia 09/21/2015  ? Essential hypertension, benign 09/21/2015  ? Primary hypothyroidism 09/21/2015  ? Iris bomb? 07/31/2012  ? Secondary angle-closure glaucoma 07/31/2012  ? ?Past Medical History:  ?Diagnosis Date  ? Anemia   ? Blindness of right eye with low vision in contralateral eye   ? s/p victrectomy  ? Diabetes mellitus, type II ([place])   ? Dyslipidemia   ? Glaucoma   ? History of blood transfusion   ? Hypertension   ? Hypothyroidism (acquired)   ? Kidney disease   ? Stage 5  ? Pneumonia   ?  ?Family History  ?Problem Relation Age of Onset  ? Heart disease Mother   ? Diabetes Mother   ? Kidney disease Mother   ? Diabetes Father   ? Heart disease Father   ? Diabetes Brother   ? Colon cancer Neg Hx   ?  ?Past Surgical History:  ?Procedure Laterality Date  ? ABDOMINAL AORTOGRAM W/LOWER EXTREMITY Bilateral 12/18/2020  ? Procedure: ABDOMINAL AORTOGRAM W/LOWER EXTREMITY;  Surgeon: Elam Dutch, MD;  Location: [facility];  Service: Cardiovascular;  Laterality: Bilateral;  ? ABDOMINAL AORTOGRAM W/LOWER EXTREMITY Bilateral 01/25/2022  ? Procedure: ABDOMINAL AORTOGRAM W/LOWER EXTREMITY;  Surgeon: Serafina Mitchell, MD;  Location: [facility];  Service: Cardiovascular;  Laterality: Bilateral;  ? ANKLE FRACTURE SURGERY Right   ? AV FISTULA PLACEMENT Left 08/18/2020  ? Procedure: LEFT ARM BRACHIOCEPHALIC ARTERIOVENOUS (AV) FISTULA CREATION;  Surgeon: Elam Dutch, MD;  Location: [facility];  Service: Vascular;  Laterality: Left;   ? BIOPSY  04/24/2021  ? Procedure: BIOPSY;  Surgeon: Eloise Harman, DO;  [facility];  Service: Endoscopy;;  ? CESAREAN SECTION    ? CHOLECYSTECTOMY    ? COLONOSCOPY  04/24/2021  ? Surgeon: Eloise Harman, DO;  nonbleeding internal hemorrhoids, 1 large (25 mm) pedunculated transverse colon polyp (prolapse type polyp) with adherent clot and stigmata of recent bleed.  ? COLONOSCOPY WITH PROPOFOL N/A 05/14/2021  ? Procedure: COLONOSCOPY WITH PROPOFOL;  Surgeon: Daneil Dolin, MD;  [facility];  Service: Endoscopy;  Laterality: N/A;  ? ESOPHAGOGASTRODUODENOSCOPY (EGD) WITH PROPOFOL N/A 04/24/2021  ? Surgeon: Eloise Harman, DO;  duodenal erosions and gastritis biopsied (pathology with peptic duodenitis, reactive gastropathy with erosions/chronic inflammation, negative for H. pylori)  ? EYE SURGERY    ? Vatrectomy  ? HEMOSTASIS CLIP PLACEMENT  05/14/2021  ? Procedure: HEMOSTASIS CLIP PLACEMENT;  Surgeon: Daneil Dolin, MD;  [facility];  Service: Endoscopy;;  ? IR PERC TUN PERIT CATH WO PORT S&I /IMAG  09/15/2020  ? IR REMOVAL TUN CV CATH W/O FL  02/19/2021  ? IR US GUIDE VASC ACCESS RIGHT  09/15/2020  ? POLYPECTOMY  04/24/2021  ? Procedure: POLYPECTOMY;  Surgeon: Eloise Harman, DO;  [facility];  Service: Endoscopy;;  ? POLYPECTOMY  05/14/2021  ? Procedure: POLYPECTOMY;  Surgeon: Daneil Dolin, MD;  [facility];  Service: Endoscopy;;  ? SKIN SPLIT GRAFT Bilateral 09/03/2021  ? Procedure: SKIN GRAFT BILATERAL LEGS;  Surgeon: Newt Minion, MD;  Location: [facility];  Service: Orthopedics;  Laterality: Bilateral;  ? SKIN SPLIT GRAFT Left 12/10/2021  ? Procedure: IRRIGATION AND DEBRIDEMENT LEFT CALF, APPLICATION SPLIT THICKNESS SKIN GRAFT;  Surgeon: Newt Minion, MD;  Location: [facility];  Service: Orthopedics;  Laterality: Left;  ? TOE SURGERY    ? ?Social History  ? ?Occupational History  ? Not on file  ?Tobacco Use  ? Smoking status: Never  ?  Smokeless tobacco: Never  ?Vaping Use  ? Vaping Use: Never used  ?Substance and Sexual Activity  ? Alcohol use: No  ? Drug use: No  ? Sexual activity: Yes  ?  Birth control/protection: Condom  ? ? ? ? ? ?

## 2022-02-14 ENCOUNTER — Inpatient Hospital Stay (HOSPITAL_COMMUNITY)
Admission: EM | Admit: 2022-02-14 | Discharge: 2022-02-19 | DRG: 239 | Disposition: A | Discharge status: Home / Self Care | Payer: Medicare Other | Attending: Family Medicine | Admitting: Family Medicine

## 2022-02-14 ENCOUNTER — Encounter (HOSPITAL_COMMUNITY): Payer: Self-pay | Admitting: Emergency Medicine

## 2022-02-14 ENCOUNTER — Other Ambulatory Visit: Payer: Self-pay

## 2022-02-14 ENCOUNTER — Ambulatory Visit (INDEPENDENT_AMBULATORY_CARE_PROVIDER_SITE_OTHER): Payer: Medicare Other | Admitting: Orthopedic Surgery

## 2022-02-14 ENCOUNTER — Emergency Department (HOSPITAL_COMMUNITY): Payer: Medicare Other

## 2022-02-14 ENCOUNTER — Encounter: Payer: Self-pay | Admitting: Orthopedic Surgery

## 2022-02-14 DIAGNOSIS — L97516 Non-pressure chronic ulcer of other part of right foot with bone involvement without evidence of necrosis: Secondary | ICD-10-CM | POA: Diagnosis present

## 2022-02-14 DIAGNOSIS — Z79899 Other long term (current) drug therapy: Secondary | ICD-10-CM

## 2022-02-14 DIAGNOSIS — H5461 Unqualified visual loss, right eye, normal vision left eye: Secondary | ICD-10-CM | POA: Diagnosis present

## 2022-02-14 DIAGNOSIS — L02611 Cutaneous abscess of right foot: Secondary | ICD-10-CM

## 2022-02-14 DIAGNOSIS — Z992 Dependence on renal dialysis: Secondary | ICD-10-CM

## 2022-02-14 DIAGNOSIS — E46 Unspecified protein-calorie malnutrition: Secondary | ICD-10-CM | POA: Diagnosis present

## 2022-02-14 DIAGNOSIS — I509 Heart failure, unspecified: Secondary | ICD-10-CM | POA: Diagnosis present

## 2022-02-14 DIAGNOSIS — Z8249 Family history of ischemic heart disease and other diseases of the circulatory system: Secondary | ICD-10-CM

## 2022-02-14 DIAGNOSIS — M86271 Subacute osteomyelitis, right ankle and foot: Secondary | ICD-10-CM | POA: Diagnosis not present

## 2022-02-14 DIAGNOSIS — E1152 Type 2 diabetes mellitus with diabetic peripheral angiopathy with gangrene: Principal | ICD-10-CM | POA: Diagnosis present

## 2022-02-14 DIAGNOSIS — Z6832 Body mass index (BMI) 32.0-32.9, adult: Secondary | ICD-10-CM

## 2022-02-14 DIAGNOSIS — E1122 Type 2 diabetes mellitus with diabetic chronic kidney disease: Secondary | ICD-10-CM | POA: Diagnosis present

## 2022-02-14 DIAGNOSIS — G47411 Narcolepsy with cataplexy: Secondary | ICD-10-CM | POA: Diagnosis present

## 2022-02-14 DIAGNOSIS — D649 Anemia, unspecified: Secondary | ICD-10-CM | POA: Diagnosis present

## 2022-02-14 DIAGNOSIS — M898X9 Other specified disorders of bone, unspecified site: Secondary | ICD-10-CM | POA: Diagnosis present

## 2022-02-14 DIAGNOSIS — E1165 Type 2 diabetes mellitus with hyperglycemia: Secondary | ICD-10-CM | POA: Diagnosis present

## 2022-02-14 DIAGNOSIS — R7982 Elevated C-reactive protein (CRP): Secondary | ICD-10-CM | POA: Diagnosis present

## 2022-02-14 DIAGNOSIS — L97519 Non-pressure chronic ulcer of other part of right foot with unspecified severity: Secondary | ICD-10-CM | POA: Diagnosis present

## 2022-02-14 DIAGNOSIS — I96 Gangrene, not elsewhere classified: Secondary | ICD-10-CM | POA: Diagnosis not present

## 2022-02-14 DIAGNOSIS — E669 Obesity, unspecified: Secondary | ICD-10-CM | POA: Diagnosis present

## 2022-02-14 DIAGNOSIS — E11621 Type 2 diabetes mellitus with foot ulcer: Secondary | ICD-10-CM | POA: Diagnosis present

## 2022-02-14 DIAGNOSIS — E039 Hypothyroidism, unspecified: Secondary | ICD-10-CM | POA: Diagnosis present

## 2022-02-14 DIAGNOSIS — L89153 Pressure ulcer of sacral region, stage 3: Secondary | ICD-10-CM | POA: Diagnosis present

## 2022-02-14 DIAGNOSIS — Z8719 Personal history of other diseases of the digestive system: Secondary | ICD-10-CM

## 2022-02-14 DIAGNOSIS — E1169 Type 2 diabetes mellitus with other specified complication: Secondary | ICD-10-CM | POA: Diagnosis present

## 2022-02-14 DIAGNOSIS — Z79890 Hormone replacement therapy (postmenopausal): Secondary | ICD-10-CM

## 2022-02-14 DIAGNOSIS — I953 Hypotension of hemodialysis: Secondary | ICD-10-CM | POA: Diagnosis not present

## 2022-02-14 DIAGNOSIS — I10 Essential (primary) hypertension: Secondary | ICD-10-CM | POA: Diagnosis present

## 2022-02-14 DIAGNOSIS — M869 Osteomyelitis, unspecified: Secondary | ICD-10-CM | POA: Diagnosis present

## 2022-02-14 DIAGNOSIS — Z888 Allergy status to other drugs, medicaments and biological substances status: Secondary | ICD-10-CM

## 2022-02-14 DIAGNOSIS — E785 Hyperlipidemia, unspecified: Secondary | ICD-10-CM | POA: Diagnosis present

## 2022-02-14 DIAGNOSIS — E119 Type 2 diabetes mellitus without complications: Secondary | ICD-10-CM

## 2022-02-14 DIAGNOSIS — I132 Hypertensive heart and chronic kidney disease with heart failure and with stage 5 chronic kidney disease, or end stage renal disease: Secondary | ICD-10-CM | POA: Diagnosis present

## 2022-02-14 DIAGNOSIS — Z7982 Long term (current) use of aspirin: Secondary | ICD-10-CM

## 2022-02-14 DIAGNOSIS — Z833 Family history of diabetes mellitus: Secondary | ICD-10-CM

## 2022-02-14 DIAGNOSIS — L089 Local infection of the skin and subcutaneous tissue, unspecified: Principal | ICD-10-CM

## 2022-02-14 DIAGNOSIS — H409 Unspecified glaucoma: Secondary | ICD-10-CM | POA: Diagnosis present

## 2022-02-14 DIAGNOSIS — D631 Anemia in chronic kidney disease: Secondary | ICD-10-CM | POA: Diagnosis present

## 2022-02-14 DIAGNOSIS — L97512 Non-pressure chronic ulcer of other part of right foot with fat layer exposed: Secondary | ICD-10-CM | POA: Diagnosis not present

## 2022-02-14 DIAGNOSIS — N186 End stage renal disease: Secondary | ICD-10-CM | POA: Diagnosis present

## 2022-02-14 DIAGNOSIS — E782 Mixed hyperlipidemia: Secondary | ICD-10-CM | POA: Diagnosis present

## 2022-02-14 DIAGNOSIS — Z794 Long term (current) use of insulin: Secondary | ICD-10-CM

## 2022-02-14 DIAGNOSIS — Z841 Family history of disorders of kidney and ureter: Secondary | ICD-10-CM

## 2022-02-14 DIAGNOSIS — Z86010 Personal history of colonic polyps: Secondary | ICD-10-CM

## 2022-02-14 LAB — CBC WITH DIFFERENTIAL/PLATELET
Abs Immature Granulocytes: 0.03 10*3/uL (ref 0.00–0.07)
Basophils Absolute: 0.1 10*3/uL (ref 0.0–0.1)
Basophils Relative: 1 %
Eosinophils Absolute: 0.3 10*3/uL (ref 0.0–0.5)
Eosinophils Relative: 3 %
HCT: 36.4 % (ref 36.0–46.0)
Hemoglobin: 11.1 g/dL — ABNORMAL LOW (ref 12.0–15.0)
Immature Granulocytes: 0 %
Lymphocytes Relative: 13 %
Lymphs Abs: 1.1 10*3/uL (ref 0.7–4.0)
MCH: 26.9 pg (ref 26.0–34.0)
MCHC: 30.5 g/dL (ref 30.0–36.0)
MCV: 88.3 fL (ref 80.0–100.0)
Monocytes Absolute: 0.8 10*3/uL (ref 0.1–1.0)
Monocytes Relative: 10 %
Neutro Abs: 5.8 10*3/uL (ref 1.7–7.7)
Neutrophils Relative %: 73 %
Platelets: 240 10*3/uL (ref 150–400)
RBC: 4.12 MIL/uL (ref 3.87–5.11)
RDW: 14.5 % (ref 11.5–15.5)
WBC: 8 10*3/uL (ref 4.0–10.5)
nRBC: 0 % (ref 0.0–0.2)

## 2022-02-14 LAB — COMPREHENSIVE METABOLIC PANEL
ALT: 11 U/L (ref 0–44)
AST: 12 U/L — ABNORMAL LOW (ref 15–41)
Albumin: 2.9 g/dL — ABNORMAL LOW (ref 3.5–5.0)
Alkaline Phosphatase: 110 U/L (ref 38–126)
Anion gap: 12 (ref 5–15)
BUN: 47 mg/dL — ABNORMAL HIGH (ref 6–20)
CO2: 26 mmol/L (ref 22–32)
Calcium: 8.9 mg/dL (ref 8.9–10.3)
Chloride: 97 mmol/L — ABNORMAL LOW (ref 98–111)
Creatinine, Ser: 6.21 mg/dL — ABNORMAL HIGH (ref 0.44–1.00)
GFR, Estimated: 8 mL/min — ABNORMAL LOW (ref >60)
Glucose, Bld: 229 mg/dL — ABNORMAL HIGH (ref 70–99)
Potassium: 3.7 mmol/L (ref 3.5–5.1)
Sodium: 135 mmol/L (ref 135–145)
Total Bilirubin: 0.6 mg/dL (ref 0.3–1.2)
Total Protein: 6.5 g/dL (ref 6.5–8.1)

## 2022-02-14 LAB — C-REACTIVE PROTEIN: CRP: 7 mg/dL — ABNORMAL HIGH (ref <1.0)

## 2022-02-14 LAB — SEDIMENTATION RATE: Sed Rate: 34 mm/hr — ABNORMAL HIGH (ref 0–22)

## 2022-02-14 LAB — LACTIC ACID, PLASMA
Lactic Acid, Venous: 0.9 mmol/L (ref 0.5–1.9)
Lactic Acid, Venous: 1.5 mmol/L (ref 0.5–1.9)
Lactic Acid, Venous: 1.2 mmol/L (ref 0.5–1.9)

## 2022-02-14 LAB — GLUCOSE, CAPILLARY: Glucose-Capillary: 318 mg/dL — ABNORMAL HIGH (ref 70–99)

## 2022-02-14 MED ORDER — VANCOMYCIN HCL IN DEXTROSE 1-5 GM/200ML-% IV SOLN
1000.0000 mg | INTRAVENOUS | Status: DC
  Administered 2022-02-15 – 2022-02-17 (×2): 1000 mg via INTRAVENOUS
  Filled 2022-02-14 (×5): qty 200

## 2022-02-14 MED ORDER — FENTANYL CITRATE PF 50 MCG/ML IJ SOSY: 25.0000 ug | PREFILLED_SYRINGE | Freq: Once | INTRAMUSCULAR | Status: DC

## 2022-02-14 MED ORDER — VANCOMYCIN HCL 2000 MG/400ML IV SOLN
2000.0000 mg | Freq: Once | INTRAVENOUS | Status: AC
Start: 2022-02-14 — End: 2022-02-14
  Administered 2022-02-14: 2000 mg via INTRAVENOUS
  Filled 2022-02-14: qty 400

## 2022-02-14 MED ORDER — TORSEMIDE 20 MG PO TABS
150.0000 mg | ORAL_TABLET | Freq: Every day | ORAL | Status: DC
  Administered 2022-02-14 – 2022-02-18 (×5): 150 mg via ORAL
  Filled 2022-02-14 (×5): qty 8

## 2022-02-14 MED ORDER — PIPERACILLIN-TAZOBACTAM 3.375 G IVPB 30 MIN: 3.3750 g | Freq: Once | INTRAVENOUS | Status: DC

## 2022-02-14 MED ORDER — CLINDAMYCIN PHOSPHATE 600 MG/50ML IV SOLN
600.0000 mg | Freq: Once | INTRAVENOUS | Status: AC
  Administered 2022-02-14: 600 mg via INTRAVENOUS
  Filled 2022-02-14: qty 50

## 2022-02-14 MED ORDER — INSULIN GLARGINE-YFGN 100 UNIT/ML ~~LOC~~ SOLN
5.0000 [IU] | Freq: Every day | SUBCUTANEOUS | Status: DC
Start: 2022-02-14 — End: 2022-02-17
  Administered 2022-02-14 – 2022-02-16 (×3): 5 [IU] via SUBCUTANEOUS
  Filled 2022-02-14 (×5): qty 0.05

## 2022-02-14 MED ORDER — ALBUTEROL SULFATE (2.5 MG/3ML) 0.083% IN NEBU: 2.5000 mg | INHALATION_SOLUTION | Freq: Four times a day (QID) | RESPIRATORY_TRACT | Status: DC | PRN

## 2022-02-14 MED ORDER — FERRIC CITRATE 1 GM 210 MG(FE) PO TABS
420.0000 mg | ORAL_TABLET | Freq: Two times a day (BID) | ORAL | Status: DC
  Administered 2022-02-14 – 2022-02-18 (×7): 420 mg via ORAL
  Filled 2022-02-14 (×12): qty 2

## 2022-02-14 MED ORDER — ACETAMINOPHEN 325 MG PO TABS: 650.0000 mg | ORAL_TABLET | Freq: Four times a day (QID) | ORAL | Status: DC | PRN

## 2022-02-14 MED ORDER — PENTOXIFYLLINE ER 400 MG PO TBCR
400.0000 mg | EXTENDED_RELEASE_TABLET | Freq: Three times a day (TID) | ORAL | Status: DC
  Administered 2022-02-14 – 2022-02-18 (×9): 400 mg via ORAL
  Filled 2022-02-14 (×18): qty 1

## 2022-02-14 MED ORDER — ACETAMINOPHEN 650 MG RE SUPP: 650.0000 mg | Freq: Four times a day (QID) | RECTAL | Status: DC | PRN

## 2022-02-14 MED ORDER — SODIUM CHLORIDE 0.9% FLUSH
3.0000 mL | Freq: Two times a day (BID) | INTRAVENOUS | Status: DC
  Administered 2022-02-15 – 2022-02-19 (×9): 3 mL via INTRAVENOUS

## 2022-02-14 MED ORDER — LOPERAMIDE HCL 2 MG PO CAPS: 2.0000 mg | ORAL_CAPSULE | ORAL | Status: DC | PRN

## 2022-02-14 MED ORDER — CHLORHEXIDINE GLUCONATE CLOTH 2 % EX PADS
6.0000 | MEDICATED_PAD | Freq: Every day | CUTANEOUS | Status: DC
  Administered 2022-02-15 – 2022-02-16 (×2): 6 via TOPICAL

## 2022-02-14 MED ORDER — METRONIDAZOLE 500 MG/100ML IV SOLN
500.0000 mg | Freq: Two times a day (BID) | INTRAVENOUS | Status: DC
  Administered 2022-02-14 – 2022-02-16 (×5): 500 mg via INTRAVENOUS
  Filled 2022-02-14 (×5): qty 100

## 2022-02-14 MED ORDER — VANCOMYCIN HCL IN DEXTROSE 1-5 GM/200ML-% IV SOLN: 1000.0000 mg | Freq: Once | INTRAVENOUS | Status: DC

## 2022-02-14 MED ORDER — ATORVASTATIN CALCIUM 10 MG PO TABS
10.0000 mg | ORAL_TABLET | Freq: Every day | ORAL | Status: DC
  Administered 2022-02-14 – 2022-02-18 (×5): 10 mg via ORAL
  Filled 2022-02-14 (×5): qty 1

## 2022-02-14 MED ORDER — HEPARIN SODIUM (PORCINE) 5000 UNIT/ML IJ SOLN
5000.0000 [IU] | Freq: Three times a day (TID) | INTRAMUSCULAR | Status: DC
  Administered 2022-02-14 – 2022-02-19 (×14): 5000 [IU] via SUBCUTANEOUS
  Filled 2022-02-14 (×14): qty 1

## 2022-02-14 MED ORDER — BACID PO TABS: 2.0000 | ORAL_TABLET | Freq: Three times a day (TID) | ORAL | Status: DC

## 2022-02-14 MED ORDER — OXYCODONE HCL 5 MG PO TABS
5.0000 mg | ORAL_TABLET | Freq: Four times a day (QID) | ORAL | Status: DC | PRN
  Filled 2022-02-14: qty 1

## 2022-02-14 MED ORDER — RISAQUAD PO CAPS
2.0000 | ORAL_CAPSULE | Freq: Three times a day (TID) | ORAL | Status: DC
  Administered 2022-02-14 – 2022-02-18 (×12): 2 via ORAL
  Filled 2022-02-14 (×14): qty 2

## 2022-02-14 MED ORDER — SODIUM CHLORIDE 0.9 % IV SOLN
2.0000 g | INTRAVENOUS | Status: DC
  Administered 2022-02-14 – 2022-02-16 (×3): 2 g via INTRAVENOUS
  Filled 2022-02-14 (×3): qty 20

## 2022-02-14 MED ORDER — LEVOTHYROXINE SODIUM 50 MCG PO TABS
50.0000 ug | ORAL_TABLET | Freq: Every day | ORAL | Status: DC
  Administered 2022-02-15 – 2022-02-19 (×5): 50 ug via ORAL
  Filled 2022-02-14 (×5): qty 1

## 2022-02-14 MED ORDER — LACTULOSE 10 GM/15ML PO SOLN: 10.0000 g | Freq: Every day | ORAL | Status: DC | PRN

## 2022-02-14 MED ORDER — PANTOPRAZOLE SODIUM 40 MG PO TBEC
40.0000 mg | DELAYED_RELEASE_TABLET | Freq: Two times a day (BID) | ORAL | Status: DC
  Administered 2022-02-14 – 2022-02-18 (×9): 40 mg via ORAL
  Filled 2022-02-14 (×9): qty 1

## 2022-02-14 MED ORDER — ASPIRIN EC 81 MG PO TBEC
81.0000 mg | DELAYED_RELEASE_TABLET | Freq: Every day | ORAL | Status: DC
  Administered 2022-02-15 – 2022-02-18 (×3): 81 mg via ORAL
  Filled 2022-02-14 (×3): qty 1

## 2022-02-14 NOTE — Assessment & Plan Note (Addendum)
Patient had prior history of skin grafting and has been being monitored by Dr. [name] in the outpatient setting. ?

## 2022-02-14 NOTE — Assessment & Plan Note (Signed)
Blood pressures were initially 107/55-121/73.  Medication regimen includes torsemide 150 mg daily. ?-Continue torsemide ?

## 2022-02-14 NOTE — Assessment & Plan Note (Addendum)
Patient presented for wound of the right foot from Dr. Jess Barters office today.  X-rays concerning for infection with signs of osteomyelitis.  Patient was started on empiric antibiotics of vancomycin and clindamycin. ?-Admit to a medical telemetry bed ?-Check ESR and CRP  ?-Continue empiric antibiotics of vancomycin and clindamycin  ?-Oxycodone as needed for pain ? ?

## 2022-02-14 NOTE — Assessment & Plan Note (Signed)
Dialysis TTS and had full session on Saturday.No signs of volume overload or need for emergent dialysis  ?-Nephrology consulted for routine dialysis ?-Continue auryxia and torsemide care per nephrology ?

## 2022-02-14 NOTE — ED Provider Triage Note (Signed)
Emergency Medicine Provider Triage Evaluation Note ? ?Amy Moses , a 51 y.o. female  was evaluated in triage.  Pt complains of wound to right foot and ankle.  Patient reports that the wounds have been there for approximately a month and have gotten progressively worse over time.  Endorses purulent discharge coming from wound on her foot.  Patient states that she had a low-grade fever earlier this week and is currently being treated for a URI with azithromycin.  Patient has history of neuropathy and therefore denies any sensation to her affected foot. ? ?Per chart review patient was seen by Dr. Due to earlier this morning was sent to the emergency department for IV antibiotics and admission. ? ?Patient is on dialysis Tuesday, Thursday, and Saturday.  Has not missed any recent dialysis appointments. ? ?Review of Systems  ?Positive: Wound, fever, numbness ?Negative:  ? ?Physical Exam  ?BP 121/73   Pulse 67   Temp 98.5 ?F (36.9 ?C) (Oral)   Resp 16   LMP 08/22/2015 (Approximate)   SpO2 96%  ?Gen:   Awake, no distress   ?Resp:  Normal effort  ?MSK:   Moves extremities without difficulty  ?Other:  Patient's right lower leg is wrapped in bandaging and Ace wrap.  Digits of right foot are visible.  Patient able to move all digits without difficulty. ? ?Medical Decision Making  ?Medically screening exam initiated at 12:32 PM.  Appropriate orders placed.  Amy Moses was informed that the remainder of the evaluation will be completed by another provider, this initial triage assessment does not replace that evaluation, and the importance of remaining in the ED until their evaluation is complete. ? ?We will order lactic acid, basic lab work, and x-ray imaging to evaluate for possible osteomyelitis. ?  ?Loni Beckwith, PA-C ?02/14/22 1234 ? ?

## 2022-02-14 NOTE — Consult Note (Signed)
Reason for Consult: To manage dialysis and dialysis related needs ?Referring Physician: Fuller Plan ? ?Amy Moses is an 51 y.o. female with DM and ESRD-  TTS [name].  She has a foot ulcer which is being followed by Dr. [name]-  she saw him today-  noted to have swelling and drainage from said ulcer so was sent to [facility] for IV ABX-  also low grade fever but does not have pain in that foot.  Does not think any surgery is planned immediately.  I am asked to provide her routine HD.  She denies complaint at the moment  ? ? ?Dialyzes at [facility]  says 5 hours ?  Been on HD for a year and a half  EDW 95.5. ?HD Bath 2/2.5, Dialyzer unknown, Heparin yes. Access left upper AVF. ? ?Past Medical History:  ?Diagnosis Date  ? Anemia   ? Blindness of right eye with low vision in contralateral eye   ? s/p victrectomy  ? Diabetes mellitus, type II ([place])   ? Dyslipidemia   ? Glaucoma   ? History of blood transfusion   ? Hypertension   ? Hypothyroidism (acquired)   ? Kidney disease   ? Stage 5  ? Pneumonia   ? ? ?Past Surgical History:  ?Procedure Laterality Date  ? ABDOMINAL AORTOGRAM W/LOWER EXTREMITY Bilateral 12/18/2020  ? Procedure: ABDOMINAL AORTOGRAM W/LOWER EXTREMITY;  Surgeon: Elam Dutch, MD;  Location: [facility];  Service: Cardiovascular;  Laterality: Bilateral;  ? ABDOMINAL AORTOGRAM W/LOWER EXTREMITY Bilateral 01/25/2022  ? Procedure: ABDOMINAL AORTOGRAM W/LOWER EXTREMITY;  Surgeon: Serafina Mitchell, MD;  Location: [facility];  Service: Cardiovascular;  Laterality: Bilateral;  ? ANKLE FRACTURE SURGERY Right   ? AV FISTULA PLACEMENT Left 08/18/2020  ? Procedure: LEFT ARM BRACHIOCEPHALIC ARTERIOVENOUS (AV) FISTULA CREATION;  Surgeon: Elam Dutch, MD;  Location: [facility];  Service: Vascular;  Laterality: Left;  ? BIOPSY  04/24/2021  ? Procedure: BIOPSY;  Surgeon: Eloise Harman, DO;  [facility];  Service: Endoscopy;;  ? CESAREAN SECTION     ? CHOLECYSTECTOMY    ? COLONOSCOPY  04/24/2021  ? Surgeon: Eloise Harman, DO;  nonbleeding internal hemorrhoids, 1 large (25 mm) pedunculated transverse colon polyp (prolapse type polyp) with adherent clot and stigmata of recent bleed.  ? COLONOSCOPY WITH PROPOFOL N/A 05/14/2021  ? Procedure: COLONOSCOPY WITH PROPOFOL;  Surgeon: Daneil Dolin, MD;  [facility];  Service: Endoscopy;  Laterality: N/A;  ? ESOPHAGOGASTRODUODENOSCOPY (EGD) WITH PROPOFOL N/A 04/24/2021  ? Surgeon: Eloise Harman, DO;  duodenal erosions and gastritis biopsied (pathology with peptic duodenitis, reactive gastropathy with erosions/chronic inflammation, negative for H. pylori)  ? EYE SURGERY    ? Vatrectomy  ? HEMOSTASIS CLIP PLACEMENT  05/14/2021  ? Procedure: HEMOSTASIS CLIP PLACEMENT;  Surgeon: Daneil Dolin, MD;  [facility];  Service: Endoscopy;;  ? IR PERC TUN PERIT CATH WO PORT S&I /IMAG  09/15/2020  ? IR REMOVAL TUN CV CATH W/O FL  02/19/2021  ? IR US GUIDE VASC ACCESS RIGHT  09/15/2020  ? POLYPECTOMY  04/24/2021  ? Procedure: POLYPECTOMY;  Surgeon: Eloise Harman, DO;  [facility];  Service: Endoscopy;;  ? POLYPECTOMY  05/14/2021  ? Procedure: POLYPECTOMY;  Surgeon: Daneil Dolin, MD;  [facility];  Service: Endoscopy;;  ? SKIN SPLIT GRAFT Bilateral 09/03/2021  ? Procedure: SKIN GRAFT BILATERAL LEGS;  Surgeon: [name],  Illene Regulus, MD;  Location: [facility];  Service: Orthopedics;  Laterality: Bilateral;  ? SKIN SPLIT GRAFT Left 12/10/2021  ? Procedure: IRRIGATION AND DEBRIDEMENT LEFT CALF, APPLICATION SPLIT THICKNESS SKIN GRAFT;  Surgeon: Newt Minion, MD;  Location: [facility];  Service: Orthopedics;  Laterality: Left;  ? TOE SURGERY    ? ? ?Family History  ?Problem Relation Age of Onset  ? Heart disease Mother   ? Diabetes Mother   ? Kidney disease Mother   ? Diabetes Father   ? Heart disease Father   ? Diabetes Brother   ? Colon cancer Neg Hx   ? ? ?Social History:  reports that  she has never smoked. She has never used smokeless tobacco. She reports that she does not drink alcohol and does not use drugs. ? ?Allergies:  ?Allergies  ?Allergen Reactions  ? Ace Inhibitors Cough  ? ? ?Medications: I have reviewed the patient's current medications. ? ?Results for orders placed or performed during the hospital encounter of 02/14/22 (from the past 48 hour(s))  ?Lactic acid, plasma     Status: None  ? Collection Time: 02/14/22 12:31 PM  ?Result Value Ref Range  ? Lactic Acid, Venous 0.9 0.5 - 1.9 mmol/L  ?  Comment: Performed at [facility], [place] [address], [place], [place] [address]  ?Comprehensive metabolic panel     Status: Abnormal  ? Collection Time: 02/14/22 12:31 PM  ?Result Value Ref Range  ? Sodium 135 135 - 145 mmol/L  ? Potassium 3.7 3.5 - 5.1 mmol/L  ? Chloride 97 (L) 98 - 111 mmol/L  ? CO2 26 22 - 32 mmol/L  ? Glucose, Bld 229 (H) 70 - 99 mg/dL  ?  Comment: Glucose reference range applies only to samples taken after fasting for at least 8 hours.  ? BUN 47 (H) 6 - 20 mg/dL  ? Creatinine, Ser 6.21 (H) 0.44 - 1.00 mg/dL  ? Calcium 8.9 8.9 - 10.3 mg/dL  ? Total Protein 6.5 6.5 - 8.1 g/dL  ? Albumin 2.9 (L) 3.5 - 5.0 g/dL  ? AST 12 (L) 15 - 41 U/L  ? ALT 11 0 - 44 U/L  ? Alkaline Phosphatase 110 38 - 126 U/L  ? Total Bilirubin 0.6 0.3 - 1.2 mg/dL  ? GFR, Estimated 8 (L) >60 mL/min  ?  Comment: (NOTE) ?Calculated using the CKD-EPI Creatinine Equation (2021) ?  ? Anion gap 12 5 - 15  ?  Comment: Performed at [facility], [place] [address], [place],  [address]  ?CBC with Differential     Status: Abnormal  ? Collection Time: 02/14/22 12:31 PM  ?Result Value Ref Range  ? WBC 8.0 4.0 - 10.5 K/uL  ? RBC 4.12 3.87 - 5.11 MIL/uL  ? Hemoglobin 11.1 (L) 12.0 - 15.0 g/dL  ? HCT 36.4 36.0 - 46.0 %  ? MCV 88.3 80.0 - 100.0 fL  ? MCH 26.9 26.0 - 34.0 pg  ? MCHC 30.5 30.0 - 36.0 g/dL  ? RDW 14.5 11.5 - 15.5 %  ? Platelets 240 150 - 400 K/uL  ? nRBC 0.0 0.0 - 0.2 %  ? Neutrophils Relative %  73 %  ? Neutro Abs 5.8 1.7 - 7.7 K/uL  ? Lymphocytes Relative 13 %  ? Lymphs Abs 1.1 0.7 - 4.0 K/uL  ? Monocytes Relative 10 %  ? Monocytes Absolute 0.8 0.1 - 1.0 K/uL  ? Eosinophils Relative 3 %  ? Eosinophils Absolute 0.3 0.0 -  0.5 K/uL  ? Basophils Relative 1 %  ? Basophils Absolute 0.1 0.0 - 0.1 K/uL  ? Immature Granulocytes 0 %  ? Abs Immature Granulocytes 0.03 0.00 - 0.07 K/uL  ?  Comment: Performed at [facility], [place] [address], [place], [place] [address]  ?Lactic acid, plasma     Status: None  ? Collection Time: 02/14/22  2:15 PM  ?Result Value Ref Range  ? Lactic Acid, Venous 1.5 0.5 - 1.9 mmol/L  ?  Comment: Performed at [facility], [place] [address], [place], [place] [address]  ? ? ?DG Ankle Complete Right ? ?Result Date: 02/14/2022 ?CLINICAL DATA:  Right ankle with EXAM: RIGHT ANKLE - COMPLETE 3+ VIEW COMPARISON:  None. FINDINGS: There is diffuse soft tissue swelling. There is a lateral midfoot/hindfoot wound with adjacent soft tissue gas and lucency and irregularity of the base of the fifth metatarsal. Vascular calcifications. Prior ankle fixation. Intact hardware without evidence of loosening. Extensive vascular calcifications. IMPRESSION: Findings suspicious for lateral midfoot/hindfoot wound with soft tissue infection and underlying osteomyelitis at the base of the fifth metatarsal. Diffuse ankle soft tissue swelling with prior ankle fixation. No evidence of loosening or periprosthetic fracture. Electronically Signed   By: Maurine Simmering M.D.   On: 02/14/2022 13:14  ? ?DG Foot Complete Right ? ?Result Date: 02/14/2022 ?CLINICAL DATA:  Right foot infection EXAM: RIGHT FOOT COMPLETE - 3+ VIEW COMPARISON:  None. FINDINGS: There is a soft tissue defect and soft tissue gas along the lateral midfoot with adjacent lucency and irregularity of the base of of the fifth metatarsal. Chronic fracture deformities of the second through fourth metatarsals. Diffuse osteopenia. Hallux valgus with severe first  MTP arthritis. Extensive vascular calcifications. Diffuse soft tissue swelling. Plantar calcaneal spurring. Prior distal tibia and fibular fixation. IMPRESSION: Findings suspicious for lateral midfoot u

## 2022-02-14 NOTE — ED Provider Notes (Signed)
?[facility] ?Provider Note ? ? ?CSN: [id number] ?Arrival date & time: 02/14/22  1212 ? ?  ? ?History ? ?Chief Complaint  ?Patient presents with  ? Skin Ulcer  ? ? ?Amy Moses is a 51 y.o. female. ? ?HPI ?52 year old female with a history of ESRD on HD Tuesday Thursday Saturday, DM type II, hypothyroidism, decubitus ulcer, diabetic foot ulcer on the right, GI bleed, presents to the ER after being evaluated by Dr. Dan Europe at his office today and was told that she needs to come to the hospital and be admitted for IV antibiotics.  She has a ulcer on her right foot for which she has been followed by Dr. [name] weekly.  She states that she has noticed that it has been getting bigger and states that Dr. Derek Jack told her that there has been some pus coming out of it.  She endorses a low-grade fever 100.4 but states that she had a upper respiratory infection for which she was taking Z-Pak and attributed the fever to that.  She denies any other complaints at this time. ?  ? ?Home Medications ?Prior to Admission medications   ?Medication Sig Start Date End Date Taking? Authorizing Provider  ?acetaminophen (TYLENOL) 500 MG tablet Take 1,000 mg by mouth every 6 (six) hours as needed for moderate pain.    [provider]  ?aspirin EC 81 MG tablet Take 1 tablet (81 mg total) by mouth daily with breakfast. 05/19/21 05/19/22  Irwin Brakeman L, MD  ?atorvastatin (LIPITOR) 10 MG tablet Take 1 tablet (10 mg total) by mouth daily. ?Patient taking differently: Take 10 mg by mouth at bedtime. 04/25/21   Roxan Hockey, MD  ?Lorin Picket 1 GM 210 MG(Fe) tablet Take 420 mg by mouth 2 (two) times daily with a meal. 01/07/22   [provider]  ?Cleda Clarks 100 UNIT/ML KwikPen Inject 2 Units into the skin 3 (three) times daily with meals. If eats 50% or more of meal. ?Patient taking differently: Inject 5-15 Units into the skin 3 (three) times daily with meals. Sliding Scale ?If eats 50%  or more of meal. 05/15/21   Johnson, Clanford L, MD  ?insulin glargine (LANTUS) 100 UNIT/ML injection Inject 0.08 mLs (8 Units total) into the skin at bedtime. 08/11/21   Elodia Florence., MD  ?lactulose [facility]) 10 GM/15ML solution Take 10 g by mouth daily as needed for moderate constipation. 11/30/21   [provider]  ?levothyroxine (SYNTHROID) 50 MCG tablet Take 1 tablet (50 mcg total) by mouth daily before breakfast. 04/25/21   Roxan Hockey, MD  ?oxyCODONE (OXY IR/ROXICODONE) 5 MG immediate release tablet Take 1 tablet (5 mg total) by mouth every 6 (six) hours as needed for moderate pain. 01/26/22   Raiford Noble Latif, DO  ?pantoprazole (PROTONIX) 40 MG tablet Take 1 tablet (40 mg total) by mouth 2 (two) times daily. 04/25/21 04/25/22  Roxan Hockey, MD  ?pentoxifylline (TRENTAL) 400 MG CR tablet Take 1 tablet (400 mg total) by mouth 3 (three) times daily with meals. 01/26/22   Raiford Noble Latif, DO  ?torsemide (DEMADEX) 100 MG tablet Take 150 mg by mouth daily.  05/22/20   [provider]  ?Vitamin D, Ergocalciferol, (DRISDOL) 1.25 MG (50000 UNIT) CAPS capsule Take 50,000 Units by mouth every 14 (fourteen) days. 04/20/21   [provider]  ?   ? ?Allergies    ?Ace inhibitors   ? ?Review of Systems   ?Review of Systems ?Ten systems reviewed  and are negative for acute change, except as noted in the HPI.  ? ?Physical Exam ?Updated Vital Signs ?BP 113/61 (BP Location: Right Arm)   Pulse 66   Temp 97.6 ?F (36.4 ?C) (Oral)   Resp 16   LMP 08/22/2015 (Approximate)   SpO2 100%  ?Physical Exam ?Vitals and nursing note reviewed.  ?Constitutional:   ?   General: She is not in acute distress. ?   Appearance: She is well-developed.  ?HENT:  ?   Head: Normocephalic and atraumatic.  ?Eyes:  ?   Conjunctiva/sclera: Conjunctivae normal.  ?Cardiovascular:  ?   Rate and Rhythm: Normal rate and regular rhythm.  ?   Heart sounds: No murmur heard. ?Pulmonary:  ?   Effort: Pulmonary effort is  normal. No respiratory distress.  ?   Breath sounds: Normal breath sounds.  ?Abdominal:  ?   Palpations: Abdomen is soft.  ?   Tenderness: There is no abdominal tenderness.  ?Musculoskeletal:     ?   General: No swelling.  ?   Cervical back: Neck supple.  ?   Comments: Right lower extremity wrapped from Dr. Levonne Lapping office.  Patient requesting that I do not remove the wrapping.  2+ DP pulses on the left leg  ?Skin: ?   General: Skin is warm and dry.  ?   Capillary Refill: Capillary refill takes less than 2 seconds.  ?Neurological:  ?   General: No focal deficit present.  ?   Mental Status: She is alert and oriented to person, place, and time.  ?Psychiatric:     ?   Mood and Affect: Mood normal.  ? ? ?ED Results / Procedures / Treatments   ?Labs ?(all labs ordered are listed, but only abnormal results are displayed) ?Labs Reviewed  ?COMPREHENSIVE METABOLIC PANEL - Abnormal; Notable for the following components:  ?    Result Value  ? Chloride 97 (*)   ? Glucose, Bld 229 (*)   ? BUN 47 (*)   ? Creatinine, Ser 6.21 (*)   ? Albumin 2.9 (*)   ? AST 12 (*)   ? GFR, Estimated 8 (*)   ? All other components within normal limits  ?CBC WITH DIFFERENTIAL/PLATELET - Abnormal; Notable for the following components:  ? Hemoglobin 11.1 (*)   ? All other components within normal limits  ?RESP PANEL BY RT-PCR (FLU A&B, COVID) [id number]  ?LACTIC ACID, PLASMA  ?LACTIC ACID, PLASMA  ?LACTIC ACID, PLASMA  ? ? ?EKG ?None ? ?Radiology ?DG Ankle Complete Right ? ?Result Date: 02/14/2022 ?CLINICAL DATA:  Right ankle with EXAM: RIGHT ANKLE - COMPLETE 3+ VIEW COMPARISON:  None. FINDINGS: There is diffuse soft tissue swelling. There is a lateral midfoot/hindfoot wound with adjacent soft tissue gas and lucency and irregularity of the base of the fifth metatarsal. Vascular calcifications. Prior ankle fixation. Intact hardware without evidence of loosening. Extensive vascular calcifications. IMPRESSION: Findings suspicious for lateral midfoot/hindfoot  wound with soft tissue infection and underlying osteomyelitis at the base of the fifth metatarsal. Diffuse ankle soft tissue swelling with prior ankle fixation. No evidence of loosening or periprosthetic fracture. Electronically Signed   By: Maurine Simmering M.D.   On: 02/14/2022 13:14  ? ?DG Foot Complete Right ? ?Result Date: 02/14/2022 ?CLINICAL DATA:  Right foot infection EXAM: RIGHT FOOT COMPLETE - 3+ VIEW COMPARISON:  None. FINDINGS: There is a soft tissue defect and soft tissue gas along the lateral midfoot with adjacent lucency and irregularity of the base of of the  fifth metatarsal. Chronic fracture deformities of the second through fourth metatarsals. Diffuse osteopenia. Hallux valgus with severe first MTP arthritis. Extensive vascular calcifications. Diffuse soft tissue swelling. Plantar calcaneal spurring. Prior distal tibia and fibular fixation. IMPRESSION: Findings suspicious for lateral midfoot ulcer and soft tissue infection with underlying osteomyelitis at the base of the fifth metatarsal. Electronically Signed   By: Maurine Simmering M.D.   On: 02/14/2022 13:12   ? ?Procedures ?Procedures  ? ? ?Medications Ordered in ED ?Medications  ?clindamycin (CLEOCIN) IVPB 600 mg (has no administration in time range)  ?vancomycin (VANCOREADY) IVPB 2000 mg/400 mL (has no administration in time range)  ?vancomycin (VANCOCIN) IVPB 1000 mg/200 mL premix (has no administration in time range)  ? ? ?ED Course/ Medical Decision Making/ A&P ?  ? ?                        ?Medical Decision Making ?Amount and/or Complexity of Data Reviewed ?Labs: ordered. Decision-making details documented in ED Course. ? ?Risk ?Prescription drug management. ? ? ?17 female presenting for admission, sent from Dr. Jess Barters office.  Patient requesting I do not unwrap her foot as she states that this was just partially done at Dr. Jess Barters office.  Exam deferred at this time.  She is otherwise afebrile, nontoxic, overall well-appearing.  Her CBC is without  leukocytosis, CMP with a blood glucose of 229 however no elevated anion gap.  Her creatinine is 6.21 consistent with ESRD.  Antibiotics ordered.  Lactic ordered.  X-ray of the right ankle and right foot consistent

## 2022-02-14 NOTE — H&P (Addendum)
?History and Physical  ? ? ?Patient: Amy Moses:[id number] DOB: [birth year] ?DOA: 02/14/2022 ?DOS: the patient was seen and examined on 02/14/2022 ?PCP: Emelda Fear, DO  ?Patient coming from: From Dr. [name]  ? ?Chief Complaint:  ?Chief Complaint  ?Patient presents with  ? Skin Ulcer  ? ?HPI: Amy Moses is a 51 y.o. female with medical history significant of HTN, HLD, ESRD on HD (TTS), DM type II, hypothyroidism, right eye blindness, and cataplexy of the bilateral lower extremities who presented from Dr. Jess Barters clinic with worsening right foot wound.  Patient had just recently been hospitalized from 3/6-3/8 for ischemic ulcer of the right foot initially treated with Vanco/Zosyn and subsequently changed to p.o. doxycycline to complete a 7-day course.  Last week she reported having intermittent fevers, but has been dealing with sinus issues for which she was placed on a Z-Pak.  She has been following with Dr. [name] a weekly basis to monitor the wound.  Review of his office note report large abscess and necrotic ulcer over the lateral aspect of the right foot.  She was sent to the emergency department for further evaluation and likely need of antibiotics. ? ?Upon admission into the emergency department patient was noted to be afebrile with vital signs relatively stable.  Labs noted hemoglobin 11.1 g/dL, BUN 43, creatinine 6.21, and glucose 226.  X-rays noted findings concerning for osteomyelitis at the base of the fifth metatarsal with diffuse soft tissue swelling and concern for infection.  Patient has been started on vancomycin and clindamycin.  TRH called to admit. ? ?Review of Systems: As mentioned in the history of present illness. All other systems reviewed and are negative. ?Past Medical History:  ?Diagnosis Date  ? Anemia   ? Blindness of right eye with low vision in contralateral eye   ? s/p victrectomy  ? Diabetes mellitus, type II ([place])   ? Dyslipidemia   ? Glaucoma   ? History of blood transfusion    ? Hypertension   ? Hypothyroidism (acquired)   ? Kidney disease   ? Stage 5  ? Pneumonia   ? ?Past Surgical History:  ?Procedure Laterality Date  ? ABDOMINAL AORTOGRAM W/LOWER EXTREMITY Bilateral 12/18/2020  ? Procedure: ABDOMINAL AORTOGRAM W/LOWER EXTREMITY;  Surgeon: Elam Dutch, MD;  Location: [facility];  Service: Cardiovascular;  Laterality: Bilateral;  ? ABDOMINAL AORTOGRAM W/LOWER EXTREMITY Bilateral 01/25/2022  ? Procedure: ABDOMINAL AORTOGRAM W/LOWER EXTREMITY;  Surgeon: Serafina Mitchell, MD;  Location: [facility];  Service: Cardiovascular;  Laterality: Bilateral;  ? ANKLE FRACTURE SURGERY Right   ? AV FISTULA PLACEMENT Left 08/18/2020  ? Procedure: LEFT ARM BRACHIOCEPHALIC ARTERIOVENOUS (AV) FISTULA CREATION;  Surgeon: Elam Dutch, MD;  Location: [facility];  Service: Vascular;  Laterality: Left;  ? BIOPSY  04/24/2021  ? Procedure: BIOPSY;  Surgeon: Eloise Harman, DO;  [facility];  Service: Endoscopy;;  ? CESAREAN SECTION    ? CHOLECYSTECTOMY    ? COLONOSCOPY  04/24/2021  ? Surgeon: Eloise Harman, DO;  nonbleeding internal hemorrhoids, 1 large (25 mm) pedunculated transverse colon polyp (prolapse type polyp) with adherent clot and stigmata of recent bleed.  ? COLONOSCOPY WITH PROPOFOL N/A 05/14/2021  ? Procedure: COLONOSCOPY WITH PROPOFOL;  Surgeon: Daneil Dolin, MD;  [facility]: [facility];  Service: Endoscopy;  Laterality: N/A;  ? ESOPHAGOGASTRODUODENOSCOPY (EGD) WITH PROPOFOL N/A 04/24/2021  ? Surgeon: Eloise Harman, DO;  duodenal erosions and gastritis biopsied (pathology with peptic duodenitis, reactive  gastropathy with erosions/chronic inflammation, negative for H. pylori)  ? EYE SURGERY    ? Vatrectomy  ? HEMOSTASIS CLIP PLACEMENT  05/14/2021  ? Procedure: HEMOSTASIS CLIP PLACEMENT;  Surgeon: Daneil Dolin, MD;  [facility];  Service: Endoscopy;;  ? IR PERC TUN PERIT CATH WO PORT S&I /IMAG  09/15/2020  ? IR REMOVAL TUN CV CATH W/O FL   02/19/2021  ? IR US GUIDE VASC ACCESS RIGHT  09/15/2020  ? POLYPECTOMY  04/24/2021  ? Procedure: POLYPECTOMY;  Surgeon: Eloise Harman, DO;  [facility];  Service: Endoscopy;;  ? POLYPECTOMY  05/14/2021  ? Procedure: POLYPECTOMY;  Surgeon: Daneil Dolin, MD;  [facility];  Service: Endoscopy;;  ? SKIN SPLIT GRAFT Bilateral 09/03/2021  ? Procedure: SKIN GRAFT BILATERAL LEGS;  Surgeon: Newt Minion, MD;  Location: [facility];  Service: Orthopedics;  Laterality: Bilateral;  ? SKIN SPLIT GRAFT Left 12/10/2021  ? Procedure: IRRIGATION AND DEBRIDEMENT LEFT CALF, APPLICATION SPLIT THICKNESS SKIN GRAFT;  Surgeon: Newt Minion, MD;  Location: [facility];  Service: Orthopedics;  Laterality: Left;  ? TOE SURGERY    ? ?Social History:  reports that she has never smoked. She has never used smokeless tobacco. She reports that she does not drink alcohol and does not use drugs. ? ?Allergies  ?Allergen Reactions  ? Ace Inhibitors Cough  ? ? ?Family History  ?Problem Relation Age of Onset  ? Heart disease Mother   ? Diabetes Mother   ? Kidney disease Mother   ? Diabetes Father   ? Heart disease Father   ? Diabetes Brother   ? Colon cancer Neg Hx   ? ? ?Prior to Admission medications   ?Medication Sig Start Date End Date Taking? Authorizing Provider  ?acetaminophen (TYLENOL) 500 MG tablet Take 1,000 mg by mouth every 6 (six) hours as needed for moderate pain.    [provider]  ?aspirin EC 81 MG tablet Take 1 tablet (81 mg total) by mouth daily with breakfast. 05/19/21 05/19/22  Irwin Brakeman L, MD  ?atorvastatin (LIPITOR) 10 MG tablet Take 1 tablet (10 mg total) by mouth daily. ?Patient taking differently: Take 10 mg by mouth at bedtime. 04/25/21   Roxan Hockey, MD  ?Lorin Picket 1 GM 210 MG(Fe) tablet Take 420 mg by mouth 2 (two) times daily with a meal. 01/07/22   [provider]  ?Cleda Clarks 100 UNIT/ML KwikPen Inject 2 Units into the skin 3 (three) times daily with meals. If eats 50% or  more of meal. ?Patient taking differently: Inject 5-15 Units into the skin 3 (three) times daily with meals. Sliding Scale ?If eats 50% or more of meal. 05/15/21   Johnson, Clanford L, MD  ?insulin glargine (LANTUS) 100 UNIT/ML injection Inject 0.08 mLs (8 Units total) into the skin at bedtime. 08/11/21   Elodia Florence., MD  ?lactulose [facility]) 10 GM/15ML solution Take 10 g by mouth daily as needed for moderate constipation. 11/30/21   [provider]  ?levothyroxine (SYNTHROID) 50 MCG tablet Take 1 tablet (50 mcg total) by mouth daily before breakfast. 04/25/21   Roxan Hockey, MD  ?oxyCODONE (OXY IR/ROXICODONE) 5 MG immediate release tablet Take 1 tablet (5 mg total) by mouth every 6 (six) hours as needed for moderate pain. 01/26/22   Raiford Noble Latif, DO  ?pantoprazole (PROTONIX) 40 MG tablet Take 1 tablet (40 mg total) by mouth 2 (two) times daily. 04/25/21 04/25/22  Roxan Hockey, MD  ?pentoxifylline (  TRENTAL) 400 MG CR tablet Take 1 tablet (400 mg total) by mouth 3 (three) times daily with meals. 01/26/22   Raiford Noble Latif, DO  ?torsemide (DEMADEX) 100 MG tablet Take 150 mg by mouth daily.  05/22/20   [provider]  ?Vitamin D, Ergocalciferol, (DRISDOL) 1.25 MG (50000 UNIT) CAPS capsule Take 50,000 Units by mouth every 14 (fourteen) days. 04/20/21   [provider]  ? ? ?Physical Exam: ?Vitals:  ? 02/14/22 1222 02/14/22 1328 02/14/22 1341  ?BP: 121/73 (!) 107/55 113/61  ?Pulse: 67 69 66  ?Resp: 16 14 16   ?Temp: 98.5 ?F (36.9 ?C) 97.6 ?F (36.4 ?C)   ?TempSrc: Oral Oral   ?SpO2: 96% 97% 100%  ? ?Constitutional: middle aged female in NAD, calm, comfortable ?Eyes: PERRL, lids and conjunctivae normal ?ENMT: Mucous membranes are moist.   ?Neck: normal, supple, no masses, no thyromegaly ?Respiratory: clear to auscultation bilaterally, no wheezing, no crackles. Normal respiratory effort.  ?Cardiovascular: Regular rate and rhythm. Fistula of right upper extremity ?Musculoskeletal:  no clubbing / cyanosis.   ?Skin:  ulcerations appreciated on the dorsal aspect of the right foot that is shallow, and lateral aspect of theright foot that is deep and tunnel as shown below ? ? ? ? ?

## 2022-02-14 NOTE — Progress Notes (Signed)
? ?Office Visit Note ?  ?Patient: Amy Moses           ?Date of Birth: 03-23-1971           ?MRN: [id number] ?Visit Date: 02/14/2022 ?             ?Requested by: Emelda Fear, DO ?[address] [place] [id number] ?PCP: Emelda Fear, DO ? ?Chief Complaint  ?Patient presents with  ? Left Leg - Routine Post Op  ? Right Leg - Follow-up  ? ? ? ? ?HPI: ?Patient is a 51 year old woman who is status post skin graft for calciphylaxis both lower extremities.  Patient has developed some small ulcers on the right foot.  Presentation at this time she has a large abscess and necrotic ulcer over the lateral aspect of the right foot. ? ?Assessment & Plan: ?Visit Diagnoses:  ?1. Cutaneous abscess of right foot   ?2. Gangrene of right foot ([place])   ? ? ?Plan: Discussed with the patient that she has good circulation to the ankle but does have small vessel disease of her foot.  Discussed that there are not surgical interventions to improve the small vessel circulation.  Recommended admission at [facility] to the hospitalist service for treatment of the abscess with purulent drainage right foot.  Discussed that we could attempt foot salvage intervention but a transtibial amputation the right may be her best option.  I will discuss surgical options with patient during her hospitalization. ? ?Follow-Up Instructions: Return in about 2 weeks (around 02/28/2022).  ? ?Ortho Exam ? ?Patient is alert, oriented, no adenopathy, well-dressed, normal affect, normal respiratory effort. ?Examination the ulcer on the posterior aspect of the right calf is almost completely healed and is approximately 2 cm x 5 mm wide with healthy granulation tissue.  Examination right foot she has a new large black gangrenous ulcer over the base of the fifth metatarsal proximally 3 cm in diameter there is purulent drainage and necrotic tissue down to bone.  She also has a new small necrotic abscess over the dorsum of the ankle.  Examination of the left  foot the skin graft for calciphylaxis of the posterior aspect the left calf is stable with healthy granulation tissue.  Patient did have an arteriogram 3 weeks ago which showed good circulation down to the foot with extensive small vessel disease within the foot. ? ?Imaging: ?No results found. ?No images are attached to the encounter. ? ?Labs: ?Lab Results  ?Component Value Date  ? HGBA1C 12.2 (H) 12/10/2021  ? HGBA1C 9.5 (H) 08/07/2021  ? HGBA1C 7.3 (H) 03/30/2021  ? ESRSEDRATE 40 (H) 08/07/2021  ? CRP 11.1 (H) 08/07/2021  ? REPTSTATUS 01/29/2022 FINAL 01/24/2022  ? GRAMSTAIN  07/08/2021  ?  RARE WBC PRESENT,BOTH PMN AND MONONUCLEAR ?RARE GRAM POSITIVE RODS ?RARE GRAM POSITIVE COCCI IN PAIRS ?  ? CULT  01/24/2022  ?  NO GROWTH 5 DAYS ?Performed at [facility], [place] [address], [place], [place] [address] ?  ? [facility]OSER[facility] 05/21/2021  ? [place] 05/21/2021  ? ? ? ?Lab Results  ?Component Value Date  ? ALBUMIN 2.8 (L) 01/25/2022  ? ALBUMIN 3.3 (L) 01/24/2022  ? ALBUMIN 3.2 (L) 12/12/2021  ? PREALBUMIN 13.7 (L) 08/07/2021  ? ? ?Lab Results  ?Component Value Date  ? MG 2.1 05/14/2021  ? MG 2.8 (H) 05/11/2021  ? MG 2.3 04/23/2021  ? ?No results found for: VD25OH ? ?Lab Results  ?Component Value Date  ?  PREALBUMIN 13.7 (L) 08/07/2021  ? ? ?  Latest Ref Rng & Units 01/26/2022  ?  5:55 AM 01/25/2022  ? 10:03 AM 01/24/2022  ? 11:29 PM  ?CBC EXTENDED  ?WBC 4.0 - 10.5 K/uL 9.6   8.7   9.6    ?RBC 3.87 - 5.11 MIL/uL 3.89   3.95   4.37    ?Hemoglobin 12.0 - 15.0 g/dL 10.8   10.8   12.1    ?HCT 36.0 - 46.0 % 34.2   34.8   38.4    ?Platelets 150 - 400 K/uL 170   174   189    ? ? ? ?There is no height or weight on file to calculate BMI. ? ?Orders:  ?No orders of the defined types were placed in this encounter. ? ?No orders of the defined types were placed in this encounter. ? ? ? Procedures: ?No procedures performed ? ?Clinical Data: ?No additional findings. ? ?ROS: ? ?All other systems negative,  except as noted in the HPI. ?Review of Systems ? ?Objective: ?Vital Signs: LMP 08/22/2015 (Approximate)  ? ?Specialty Comments:  ?No specialty comments available. ? ?PMFS History: ?Patient Active Problem List  ? Diagnosis Date Noted  ? Obesity (BMI 30-39.9) 01/26/2022  ? Ischemic ulcer of right foot ([place]) 01/24/2022  ? Normocytic anemia 01/24/2022  ? Sacral pressure ulcer 01/24/2022  ? Metabolic acidosis, increased anion gap 01/24/2022  ? Leg wound, left, sequela 12/10/2021  ? Open leg wound 09/03/2021  ? Wound infection   ? Non-pressure chronic ulcer of right calf limited to breakdown of skin ([place])   ? Calciphylaxis of right lower extremity with nonhealing ulcer, limited to breakdown of skin ([place])   ? Chronic ulcer of left thigh ([place]) 08/07/2021  ? Calciphylaxis of left lower extremity with nonhealing ulcer with necrosis of muscle ([place]) 08/07/2021  ? Lower GI bleed 05/12/2021  ? Acute GI bleeding 04/24/2021  ? Acute blood loss anemia 04/23/2021  ? GI bleed 04/22/2021  ? Fever 03/31/2021  ? Acute on chronic heart failure with preserved ejection fraction (HFpEF) ([place]) 03/30/2021  ? End stage renal disease ([place]) 11/16/2020  ? Calciphylaxis 11/06/2020  ? Non-healing open wound of heel 11/03/2020  ? Diabetic foot infection ([place]) 11/01/2020  ? Decubitus ulcer, heel 11/01/2020  ? Closed nondisplaced fracture of left patella 10/29/2020  ? Metabolic acidosis 09/98/3382  ? Acute on chronic renal failure ([place]) 06/10/2020  ? Acute pericardial effusion 06/10/2020  ? Chronic kidney disease, stage 4 (severe) ([place]) 03/05/2019  ? Vitamin D deficiency 01/28/2019  ? Type 2 diabetes mellitus with complication, without long-term current use of insulin ([place]) 09/21/2015  ? Hyperlipidemia 09/21/2015  ? Essential hypertension, benign 09/21/2015  ? Primary hypothyroidism 09/21/2015  ? Iris bomb? 07/31/2012  ? Secondary angle-closure glaucoma 07/31/2012  ? ?Past Medical History:  ?Diagnosis Date  ? Anemia   ? Blindness of right eye with low  vision in contralateral eye   ? s/p victrectomy  ? Diabetes mellitus, type II ([place])   ? Dyslipidemia   ? Glaucoma   ? History of blood transfusion   ? Hypertension   ? Hypothyroidism (acquired)   ? Kidney disease   ? Stage 5  ? Pneumonia   ?  ?Family History  ?Problem Relation Age of [place]  ? Heart disease Mother   ? Diabetes Mother   ? Kidney disease Mother   ? Diabetes Father   ? Heart disease Father   ? Diabetes Brother   ? Colon  cancer Neg Hx   ?  ?Past Surgical History:  ?Procedure Laterality Date  ? ABDOMINAL AORTOGRAM W/LOWER EXTREMITY Bilateral 12/18/2020  ? Procedure: ABDOMINAL AORTOGRAM W/LOWER EXTREMITY;  Surgeon: Elam Dutch, MD;  Location: [facility];  Service: Cardiovascular;  Laterality: Bilateral;  ? ABDOMINAL AORTOGRAM W/LOWER EXTREMITY Bilateral 01/25/2022  ? Procedure: ABDOMINAL AORTOGRAM W/LOWER EXTREMITY;  Surgeon: Serafina Mitchell, MD;  Location: [facility];  Service: Cardiovascular;  Laterality: Bilateral;  ? ANKLE FRACTURE SURGERY Right   ? AV FISTULA PLACEMENT Left 08/18/2020  ? Procedure: LEFT ARM BRACHIOCEPHALIC ARTERIOVENOUS (AV) FISTULA CREATION;  Surgeon: Elam Dutch, MD;  Location: [name];  Service: Vascular;  Laterality: Left;  ? BIOPSY  04/24/2021  ? Procedure: BIOPSY;  Surgeon: Eloise Harman, DO;  [facility];  Service: Endoscopy;;  ? CESAREAN SECTION    ? CHOLECYSTECTOMY    ? COLONOSCOPY  04/24/2021  ? Surgeon: Eloise Harman, DO;  nonbleeding internal hemorrhoids, 1 large (25 mm) pedunculated transverse colon polyp (prolapse type polyp) with adherent clot and stigmata of recent bleed.  ? COLONOSCOPY WITH PROPOFOL N/A 05/14/2021  ? Procedure: COLONOSCOPY WITH PROPOFOL;  Surgeon: Daneil Dolin, MD;  [facility];  Service: Endoscopy;  Laterality: N/A;  ? ESOPHAGOGASTRODUODENOSCOPY (EGD) WITH PROPOFOL N/A 04/24/2021  ? Surgeon: Eloise Harman, DO;  duodenal erosions and gastritis biopsied (pathology with peptic duodenitis,  reactive gastropathy with erosions/chronic inflammation, negative for H. pylori)  ? EYE SURGERY    ? Vatrectomy  ? HEMOSTASIS CLIP PLACEMENT  05/14/2021  ? Procedure: HEMOSTASIS CLIP PLACEMENT;  Surgeon: Carolyn Stare

## 2022-02-14 NOTE — Progress Notes (Signed)
Pharmacy Antibiotic Note ? ?Amy Moses is a 51 y.o. female admitted on 02/14/2022 presenting with ortho visit with worsening necrotic ulcer and abscess on foot.  Pharmacy has been consulted for vancomycin dosing.  ESRD-HD usually TTS ? ?Plan: ?Vancomycin 2000 mg IV x 1, then 1000 mg IV qHD ?Monitor HD schedule, Cx and ortho plans ?Vancomycin levels as needed ? ?  ? ?Temp (24hrs), Avg:98.1 ?F (36.7 ?C), Min:97.6 ?F (36.4 ?C), Max:98.5 ?F (36.9 ?C) ? ?Recent Labs  ?Lab 02/14/22 ?1231  ?WBC 8.0  ?CREATININE 6.21*  ?LATICACIDVEN 0.9  ?  ?CrCl cannot be calculated (Unknown ideal weight.).   ? ?Allergies  ?Allergen Reactions  ? Ace Inhibitors Cough  ? ? ?Bertis Ruddy, PharmD ?Clinical Pharmacist ?ED Pharmacist Phone # [phone] ?02/14/2022 1:50 PM ? ? ?

## 2022-02-14 NOTE — Progress Notes (Signed)
Notified Pharmacy of missing medication pentoxifylline  ? ?

## 2022-02-14 NOTE — Assessment & Plan Note (Signed)
Hemoglobin 11.1 g/dL which appears around patient's baseline of 10 to 11 g/dL. ?-Continue to monitor ?

## 2022-02-14 NOTE — Assessment & Plan Note (Addendum)
On admission glucose elevated 229.  Last hemoglobin A1c was 12.2 on 11/2021.  Home regimen appears to include Lantus 5 units nightly and utilizing a sliding scale of insulin 5 to 15 units 2 times daily with meals. ?-Hypoglycemic protocols ?-Continue pharmacy substitution of Semglee 5 units nightly ?-CBGs before every meal with moderate sliding scale insulin ?-Adjust insulin regimen as needed ?

## 2022-02-14 NOTE — ED Triage Notes (Signed)
Patient coming from dr, seen by dr duda this am, ulcer worse and now pulsatile in office. Pt sent for antibiotics and admission. ?

## 2022-02-14 NOTE — Assessment & Plan Note (Signed)
TSH was 3.109 earlier this month. ?-Continue levothyroxine ? ?

## 2022-02-14 NOTE — Assessment & Plan Note (Signed)
Continue atorvastatin

## 2022-02-15 DIAGNOSIS — E1165 Type 2 diabetes mellitus with hyperglycemia: Secondary | ICD-10-CM | POA: Diagnosis not present

## 2022-02-15 DIAGNOSIS — M86271 Subacute osteomyelitis, right ankle and foot: Secondary | ICD-10-CM | POA: Diagnosis not present

## 2022-02-15 DIAGNOSIS — N186 End stage renal disease: Secondary | ICD-10-CM | POA: Diagnosis not present

## 2022-02-15 LAB — RENAL FUNCTION PANEL
Albumin: 2.6 g/dL — ABNORMAL LOW (ref 3.5–5.0)
Anion gap: 16 — ABNORMAL HIGH (ref 5–15)
BUN: 56 mg/dL — ABNORMAL HIGH (ref 6–20)
CO2: 24 mmol/L (ref 22–32)
Calcium: 8.5 mg/dL — ABNORMAL LOW (ref 8.9–10.3)
Chloride: 94 mmol/L — ABNORMAL LOW (ref 98–111)
Creatinine, Ser: 6.84 mg/dL — ABNORMAL HIGH (ref 0.44–1.00)
GFR, Estimated: 7 mL/min — ABNORMAL LOW (ref >60)
Glucose, Bld: 351 mg/dL — ABNORMAL HIGH (ref 70–99)
Phosphorus: 6.9 mg/dL — ABNORMAL HIGH (ref 2.5–4.6)
Potassium: 3.8 mmol/L (ref 3.5–5.1)
Sodium: 134 mmol/L — ABNORMAL LOW (ref 135–145)

## 2022-02-15 LAB — HEPATITIS B SURFACE ANTIGEN: Hepatitis B Surface Ag: NONREACTIVE

## 2022-02-15 LAB — CBC
HCT: 35 % — ABNORMAL LOW (ref 36.0–46.0)
Hemoglobin: 11 g/dL — ABNORMAL LOW (ref 12.0–15.0)
MCH: 27.4 pg (ref 26.0–34.0)
MCHC: 31.4 g/dL (ref 30.0–36.0)
MCV: 87.1 fL (ref 80.0–100.0)
Platelets: 227 10*3/uL (ref 150–400)
RBC: 4.02 MIL/uL (ref 3.87–5.11)
RDW: 14.6 % (ref 11.5–15.5)
WBC: 7.1 10*3/uL (ref 4.0–10.5)
nRBC: 0 % (ref 0.0–0.2)

## 2022-02-15 LAB — HEMOGLOBIN A1C
Hgb A1c MFr Bld: 11.5 % — ABNORMAL HIGH (ref 4.8–5.6)
Mean Plasma Glucose: 283.35 mg/dL

## 2022-02-15 LAB — GLUCOSE, CAPILLARY
Glucose-Capillary: 223 mg/dL — ABNORMAL HIGH (ref 70–99)
Glucose-Capillary: 292 mg/dL — ABNORMAL HIGH (ref 70–99)
Glucose-Capillary: 175 mg/dL — ABNORMAL HIGH (ref 70–99)

## 2022-02-15 LAB — HEPATITIS B SURFACE ANTIBODY,QUALITATIVE: Hep B S Ab: NONREACTIVE

## 2022-02-15 MED ORDER — GUAIFENESIN-DM 100-10 MG/5ML PO SYRP
5.0000 mL | ORAL_SOLUTION | ORAL | Status: DC | PRN
Start: 2022-02-15 — End: 2022-02-16
  Administered 2022-02-15 (×2): 5 mL via ORAL
  Filled 2022-02-15 (×2): qty 10

## 2022-02-15 MED ORDER — ALTEPLASE 2 MG IJ SOLR
2.0000 mg | Freq: Once | INTRAMUSCULAR | Status: DC | PRN
  Filled 2022-02-15: qty 2

## 2022-02-15 MED ORDER — SODIUM CHLORIDE 0.9 % IV SOLN: 100.0000 mL | INTRAVENOUS | Status: DC | PRN

## 2022-02-15 MED ORDER — LIDOCAINE HCL (PF) 1 % IJ SOLN
5.0000 mL | INTRAMUSCULAR | Status: DC | PRN
  Filled 2022-02-15: qty 5

## 2022-02-15 MED ORDER — HEPARIN SODIUM (PORCINE) 1000 UNIT/ML DIALYSIS
20.0000 [IU]/kg | INTRAMUSCULAR | Status: DC | PRN
  Filled 2022-02-15: qty 2

## 2022-02-15 MED ORDER — PENTAFLUOROPROP-TETRAFLUOROETH EX AERO: 1.0000 "application " | INHALATION_SPRAY | CUTANEOUS | Status: DC | PRN

## 2022-02-15 MED ORDER — HEPARIN SODIUM (PORCINE) 1000 UNIT/ML DIALYSIS
1000.0000 [IU] | INTRAMUSCULAR | Status: DC | PRN
  Filled 2022-02-15: qty 1

## 2022-02-15 MED ORDER — INSULIN ASPART 100 UNIT/ML IJ SOLN
0.0000 [IU] | Freq: Three times a day (TID) | INTRAMUSCULAR | Status: DC
  Administered 2022-02-15: 5 [IU] via SUBCUTANEOUS
  Administered 2022-02-15 – 2022-02-16 (×2): 8 [IU] via SUBCUTANEOUS
  Administered 2022-02-16 – 2022-02-17 (×2): 3 [IU] via SUBCUTANEOUS
  Administered 2022-02-17 – 2022-02-18 (×3): 5 [IU] via SUBCUTANEOUS
  Administered 2022-02-18 – 2022-02-19 (×2): 3 [IU] via SUBCUTANEOUS

## 2022-02-15 MED ORDER — LIDOCAINE-PRILOCAINE 2.5-2.5 % EX CREA
1.0000 "application " | TOPICAL_CREAM | CUTANEOUS | Status: DC | PRN
  Filled 2022-02-15: qty 5

## 2022-02-15 NOTE — Progress Notes (Signed)
?PROGRESS NOTE ? ?Amy Moses  OEV:[id number] DOB: 1971-09-30 DOA: 02/14/2022 ?PCP: Emelda Fear, DO  ? ?Brief Narrative: ?Patient is a 51 year old female with history of hypertension, hyperlipidemia, ESRD on dialysis on TTS schedule, diabetes type 2, hypothyroidism, right eye blindness, calciphylaxis of bilateral lower extremities who presented from Dr. Jess Barters clinic with worsening right foot wound.  She was recently hospitalized from 3/6 - 3/8 for ischemic ulcer of the right foot and was discharged on oral antibiotics.  Evaluation at Dr. Jess Barters office showed large abscess and necrotic ulcer on the lateral aspect of the right foot.  X-ray showed osteomyelitis of the base of fifth metatarsal with diffuse soft tissue swelling, concern for infection.  Started on broad-spectrum antibiotics.  Orthopedics following, considering 5 ray amputation tomorrow. ? ?Assessment & Plan: ? ?Principal Problem: ?  Osteomyelitis of foot ([place]) ?Active Problems: ?  Ischemic ulcer of right foot ([place]) ?  End stage renal disease ([place]) ?  Uncontrolled type 2 diabetes mellitus with hyperglycemia, with long-term current use of insulin ([place]) ?  Primary hypothyroidism ?  Hyperlipidemia ?  Normocytic anemia ?  Essential hypertension, benign ?  Calciphylaxis ? ?Osteomyelitis of the right foot: Presented from orthopedics office.  X-ray concerning for infection, signs of osteomyelitis.  Started on vancomycin,, metronidazole, Rocephin.  Elevated  CRP.  Continue pain management. ?Orthopedics following, considering 5 ray amputation of right foot ? ?ESRD: Dialyzed on TTS schedule.  Nephrology consulted and following.  Hemodialysis access on the left arm ? ?Uncontrolled type 2 diabetes with hyperglycemia with long-term insulin use: A1c of 12.2 as per 1/23.  Takes insulin at home.  Monitor blood sugars.  Continue current regimen ? ?Hypothyroidism: Continue Synthyroid ? ?Hyperlipidemia: Continue Lipitor ? ?Normocytic anemia: Currently hemoglobin  stable, associated with chronic kidney disease ? ?Hypertension: Monitor blood pressure .  Continue current medications ? ?Calciphylaxis: On both lower extremities.  History of prior skin grafting, follows with Dr. [name] ? ?Sacral ulcer: ? ?  ?Pressure Injury 01/24/22 Buttocks Bilateral Stage 3 -  Full thickness tissue loss. Subcutaneous fat may be visible but bone, tendon or muscle are NOT exposed. (Active)  ?01/24/22 2300  ?Location: Buttocks  ?Location Orientation: Bilateral  ?Staging: Stage 3 -  Full thickness tissue loss. Subcutaneous fat may be visible but bone, tendon or muscle are NOT exposed.  ?Wound Description (Comments):   ?Present on Admission: Yes  ?Dressing Type Foam - Lift dressing to assess site every shift 02/14/22 2145  ? ? ?DVT prophylaxis:heparin injection 5,000 Units Start: 02/14/22 1500 ?SCDs Start: 02/14/22 1446 ? ? ?  Code Status: Full Code ? ?Family Communication: None at bedside ? ?Patient status:Inpatient ? ?Patient is from :Home ? ?Anticipated discharge WE:XHBZ ? ?Estimated DC date:after orthopedics clearance ? ? ?Consultants: Orthopedics ? ?Procedures:None yet ? ?Antimicrobials:  ?Anti-infectives (From admission, onward)  ? ? Start     Dose/Rate Route Frequency Ordered Stop  ? 02/15/22 1200  vancomycin (VANCOCIN) IVPB 1000 mg/200 mL premix       ? 1,000 mg ?200 mL/hr over 60 Minutes Intravenous Every T-Th-Sa (Hemodialysis) 02/14/22 1354    ? 02/14/22 2100  cefTRIAXone (ROCEPHIN) 2 g in sodium chloride 0.9 % 100 mL IVPB       ?See Hyperspace for full Linked Orders Report.  ? 2 g ?200 mL/hr over 30 Minutes Intravenous Every 24 hours 02/14/22 1903 02/21/22 2059  ? 02/14/22 2000  metroNIDAZOLE (FLAGYL) IVPB 500 mg       ?See Hyperspace for full Linked Orders Report.  ?  500 mg ?100 mL/hr over 60 Minutes Intravenous Every 12 hours 02/14/22 1903 02/21/22 1959  ? 02/14/22 1400  piperacillin-tazobactam (ZOSYN) IVPB 3.375 g  Status:  Discontinued       ? 3.375 g ?100 mL/hr over 30 Minutes  Intravenous  Once 02/14/22 1347 02/14/22 1347  ? 02/14/22 1400  vancomycin (VANCOCIN) IVPB 1000 mg/200 mL premix  Status:  Discontinued       ? 1,000 mg ?200 mL/hr over 60 Minutes Intravenous  Once 02/14/22 1347 02/14/22 1347  ? 02/14/22 1400  clindamycin (CLEOCIN) IVPB 600 mg       ? 600 mg ?100 mL/hr over 30 Minutes Intravenous  Once 02/14/22 1349 02/14/22 1524  ? 02/14/22 1400  vancomycin (VANCOREADY) IVPB 2000 mg/400 mL       ? 2,000 mg ?200 mL/hr over 120 Minutes Intravenous  Once 02/14/22 1354 02/14/22 1739  ? ?  ? ? ?Subjective: ?Patient seen and examined at the bedside this morning.  Hemodynamically stable, on dialysis.  Denies any new complaints ? ?Objective: ?Vitals:  ? 02/14/22 1626 02/14/22 1654 02/14/22 2004 02/15/22 0600  ?BP:  125/71 104/65   ?Pulse:  70 67   ?Resp:  18 18   ?Temp: (!) 97.3 ?F (36.3 ?C) 97.8 ?F (36.6 ?C) 97.7 ?F (36.5 ?C)   ?TempSrc: Oral Oral Oral   ?SpO2:  99% 97%   ?Weight:    91.3 kg  ?Height:    5\' 6"  (1.676 m)  ? ? ?Intake/Output Summary (Last 24 hours) at 02/15/2022 0736 ?Last data filed at 02/15/2022 0000 ?Gross per 24 hour  ?Intake 730.59 ml  ?Output --  ?Net 730.59 ml  ? ?Filed Weights  ? 02/15/22 0600  ?Weight: 91.3 kg  ? ? ?Examination: ? ?General exam: Overall comfortable, not in distress ?HEENT: Blind on right eye ?Respiratory system:  no wheezes or crackles  ?Cardiovascular system: S1 & S2 heard, RRR.  ?Gastrointestinal system: Abdomen is nondistended, soft and nontender. ?Central nervous system: Alert and oriented ?Extremities: See picture below, AV fistula on the left arm ?Skin: Healed dry rash on the chest and face ? ? ? ?Data Reviewed: I have personally reviewed following labs and imaging studies ? ?CBC: ?Recent Labs  ?Lab 02/14/22 ?1231 02/15/22 ?0641  ?WBC 8.0 7.1  ?NEUTROABS 5.8  --   ?HGB 11.1* 11.0*  ?HCT 36.4 35.0*  ?MCV 88.3 87.1  ?PLT 240 227  ? ?Basic Metabolic Panel: ?Recent Labs  ?Lab 02/14/22 ?1231  ?NA 135  ?K 3.7  ?CL 97*  ?CO2 26  ?GLUCOSE 229*  ?BUN  47*  ?CREATININE 6.21*  ?CALCIUM 8.9  ? ? ? ?No results found for this or any previous visit (from the past 240 hour(s)).  ? ?Radiology Studies: ?DG Ankle Complete Right ? ?Result Date: 02/14/2022 ?CLINICAL DATA:  Right ankle with EXAM: RIGHT ANKLE - COMPLETE 3+ VIEW COMPARISON:  None. FINDINGS: There is diffuse soft tissue swelling. There is a lateral midfoot/hindfoot wound with adjacent soft tissue gas and lucency and irregularity of the base of the fifth metatarsal. Vascular calcifications. Prior ankle fixation. Intact hardware without evidence of loosening. Extensive vascular calcifications. IMPRESSION: Findings suspicious for lateral midfoot/hindfoot wound with soft tissue infection and underlying osteomyelitis at the base of the fifth metatarsal. Diffuse ankle soft tissue swelling with prior ankle fixation. No evidence of loosening or periprosthetic fracture. Electronically Signed   By: Maurine Simmering M.D.   On: 02/14/2022 13:14  ? ?DG Foot Complete Right ? ?Result Date: 02/14/2022 ?CLINICAL DATA:  Right foot infection EXAM: RIGHT FOOT COMPLETE - 3+ VIEW COMPARISON:  None. FINDINGS: There is a soft tissue defect and soft tissue gas along the lateral midfoot with adjacent lucency and irregularity of the base of of the fifth metatarsal. Chronic fracture deformities of the second through fourth metatarsals. Diffuse osteopenia. Hallux valgus with severe first MTP arthritis. Extensive vascular calcifications. Diffuse soft tissue swelling. Plantar calcaneal spurring. Prior distal tibia and fibular fixation. IMPRESSION: Findings suspicious for lateral midfoot ulcer and soft tissue infection with underlying osteomyelitis at the base of the fifth metatarsal. Electronically Signed   By: Maurine Simmering M.D.   On: 02/14/2022 13:12   ? ?Scheduled Meds: ? acidophilus  2 capsule Oral TID  ? aspirin EC  81 mg Oral Q breakfast  ? atorvastatin  10 mg Oral QHS  ? Chlorhexidine Gluconate Cloth  6 each Topical Q0600  ? ferric citrate  420  mg Oral BID WC  ? heparin  5,000 Units Subcutaneous Q8H  ? insulin glargine-yfgn  5 Units Subcutaneous QHS  ? levothyroxine  50 mcg Oral QAC breakfast  ? pantoprazole  40 mg Oral BID  ? pentoxifylline  400 mg Oral TI

## 2022-02-15 NOTE — Consult Note (Signed)
? ?ORTHOPAEDIC CONSULTATION ? ?REQUESTING PHYSICIAN: Shelly Coss, MD ? ?Chief Complaint: Progressive necrotic ulcer lateral right foot. ? ?HPI: ?Amy Moses is a 51 y.o. female who presents with necrotic ulcer base of the right foot laterally as well as a abscess dorsally over the ankle.  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  The right lower extremity has healed significantly better than the left lower extremity however on the right lower extremity lateral foot patient has developed a progressive necrotic ulcer.  Patient has end-stage renal disease on dialysis type 2 diabetes with severe peripheral vascular disease.  Patient has undergone arteriogram studies of both lower extremities and there are no reconstructable lesions. ? ?Past Medical History:  ?Diagnosis Date  ? Anemia   ? Blindness of right eye with low vision in contralateral eye   ? s/p victrectomy  ? Diabetes mellitus, type II ([place])   ? Dyslipidemia   ? Glaucoma   ? History of blood transfusion   ? Hypertension   ? Hypothyroidism (acquired)   ? Kidney disease   ? Stage 5  ? Pneumonia   ? ?Past Surgical History:  ?Procedure Laterality Date  ? ABDOMINAL AORTOGRAM W/LOWER EXTREMITY Bilateral 12/18/2020  ? Procedure: ABDOMINAL AORTOGRAM W/LOWER EXTREMITY;  Surgeon: Elam Dutch, MD;  Location: [place] [facility];  Service: Cardiovascular;  Laterality: Bilateral;  ? ABDOMINAL AORTOGRAM W/LOWER EXTREMITY Bilateral 01/25/2022  ? Procedure: ABDOMINAL AORTOGRAM W/LOWER EXTREMITY;  Surgeon: Serafina Mitchell, MD;  Location: [place] [facility];  Service: Cardiovascular;  Laterality: Bilateral;  ? ANKLE FRACTURE SURGERY Right   ? AV FISTULA PLACEMENT Left 08/18/2020  ? Procedure: LEFT ARM BRACHIOCEPHALIC ARTERIOVENOUS (AV) FISTULA CREATION;  Surgeon: Elam Dutch, MD;  Location: [place];  Service: Vascular;  Laterality: Left;  ? BIOPSY  04/24/2021  ? Procedure: BIOPSY;   Surgeon: Eloise Harman, DO;  [facility];  Service: Endoscopy;;  ? CESAREAN SECTION    ? CHOLECYSTECTOMY    ? COLONOSCOPY  04/24/2021  ? Surgeon: Eloise Harman, DO;  nonbleeding internal hemorrhoids, 1 large (25 mm) pedunculated transverse colon polyp (prolapse type polyp) with adherent clot and stigmata of recent bleed.  ? COLONOSCOPY WITH PROPOFOL N/A 05/14/2021  ? Procedure: COLONOSCOPY WITH PROPOFOL;  Surgeon: Daneil Dolin, MD;  [facility];  Service: Endoscopy;  Laterality: N/A;  ? ESOPHAGOGASTRODUODENOSCOPY (EGD) WITH PROPOFOL N/A 04/24/2021  ? Surgeon: Eloise Harman, DO;  duodenal erosions and gastritis biopsied (pathology with peptic duodenitis, reactive gastropathy with erosions/chronic inflammation, negative for H. pylori)  ? EYE SURGERY    ? Vatrectomy  ? HEMOSTASIS CLIP PLACEMENT  05/14/2021  ? Procedure: HEMOSTASIS CLIP PLACEMENT;  Surgeon: Daneil Dolin, MD;  [facility];  Service: Endoscopy;;  ? IR PERC TUN PERIT CATH WO PORT S&I /IMAG  09/15/2020  ? IR REMOVAL TUN CV CATH W/O FL  02/19/2021  ? IR US GUIDE VASC ACCESS RIGHT  09/15/2020  ? POLYPECTOMY  04/24/2021  ? Procedure: POLYPECTOMY;  Surgeon: Eloise Harman, DO;  [facility];  Service: Endoscopy;;  ? POLYPECTOMY  05/14/2021  ? Procedure: POLYPECTOMY;  Surgeon: Daneil Dolin, MD;  [facility];  Service: Endoscopy;;  ? SKIN SPLIT GRAFT Bilateral 09/03/2021  ? Procedure: SKIN GRAFT BILATERAL LEGS;  Surgeon: Newt Minion, MD;  Location: [place];  Service: Orthopedics;  Laterality: Bilateral;  ? SKIN SPLIT GRAFT Left 12/10/2021  ?  Procedure: IRRIGATION AND DEBRIDEMENT LEFT CALF, APPLICATION SPLIT THICKNESS SKIN GRAFT;  Surgeon: Newt Minion, MD;  Location: [place];  Service: Orthopedics;  Laterality: Left;  ? TOE SURGERY    ? ?Social History  ? ?Socioeconomic History  ? Marital status: Single  ?  Spouse name: Not on file  ? Number of children: Not on file  ? Years of  education: Not on file  ? Highest education level: Not on file  ?Occupational History  ? Not on file  ?Tobacco Use  ? Smoking status: Never  ? Smokeless tobacco: Never  ?Vaping Use  ? Vaping Use: Never used  ?Substance and Sexual Activity  ? Alcohol use: No  ? Drug use: No  ? Sexual activity: Yes  ?  Birth control/protection: Condom  ?Other Topics Concern  ? Not on file  ?Social History Narrative  ? Not on file  ? ?Social Determinants of Health  ? ?Financial Resource Strain: Not on file  ?Food Insecurity: Not on file  ?Transportation Needs: Not on file  ?Physical Activity: Not on file  ?Stress: Not on file  ?Social Connections: Not on file  ? ?Family History  ?Problem Relation Age of Onset  ? Heart disease Mother   ? Diabetes Mother   ? Kidney disease Mother   ? Diabetes Father   ? Heart disease Father   ? Diabetes Brother   ? Colon cancer Neg Hx   ? ?- negative except otherwise stated in the family history section ?Allergies  ?Allergen Reactions  ? Ace Inhibitors Cough  ? ?Prior to Admission medications   ?Medication Sig Start Date End Date Taking? Authorizing Provider  ?acetaminophen (TYLENOL) 500 MG tablet Take 1,000 mg by mouth every 6 (six) hours as needed for moderate pain.   Yes [provider]  ?aspirin EC 81 MG tablet Take 1 tablet (81 mg total) by mouth daily with breakfast. 05/19/21 05/19/22 Yes Johnson, Clanford L, MD  ?atorvastatin (LIPITOR) 10 MG tablet Take 1 tablet (10 mg total) by mouth daily. ?Patient taking differently: Take 10 mg by mouth at bedtime. 04/25/21  Yes Roxan Hockey, MD  ?AURYXIA 1 GM 210 MG(Fe) tablet Take 420 mg by mouth 2 (two) times daily with a meal. 01/07/22  Yes [provider]  ?HUMALOG KWIKPEN 100 UNIT/ML KwikPen Inject 2 Units into the skin 3 (three) times daily with meals. If eats 50% or more of meal. ?Patient taking differently: Inject 5-15 Units into the skin 2 (two) times daily with a meal. Sliding Scale ?If eats 50% or more of meal. 05/15/21  Yes Johnson,  Clanford L, MD  ?insulin glargine (LANTUS) 100 UNIT/ML injection Inject 0.08 mLs (8 Units total) into the skin at bedtime. ?Patient taking differently: Inject 5 Units into the skin at bedtime. 08/11/21  Yes Elodia Florence., MD  ?lactulose [facility]) 10 GM/15ML solution Take 10 g by mouth daily as needed for moderate constipation. 11/30/21  Yes [provider]  ?levothyroxine (SYNTHROID) 50 MCG tablet Take 1 tablet (50 mcg total) by mouth daily before breakfast. 04/25/21  Yes Emokpae, Courage, MD  ?oxyCODONE (OXY IR/ROXICODONE) 5 MG immediate release tablet Take 1 tablet (5 mg total) by mouth every 6 (six) hours as needed for moderate pain. 01/26/22  Yes Sheikh, Omair Latif, DO  ?pantoprazole (PROTONIX) 40 MG tablet Take 1 tablet (40 mg total) by mouth 2 (two) times daily. 04/25/21 04/25/22 Yes Emokpae, Courage, MD  ?pentoxifylline (TRENTAL) 400 MG CR tablet Take 1 tablet (400 mg  total) by mouth 3 (three) times daily with meals. 01/26/22  Yes Sheikh, Omair Latif, DO  ?torsemide (DEMADEX) 100 MG tablet Take 150 mg by mouth daily.  05/22/20  Yes [provider]  ?Vitamin D, Ergocalciferol, (DRISDOL) 1.25 MG (50000 UNIT) CAPS capsule Take 50,000 Units by mouth every 14 (fourteen) days. 04/20/21  Yes [provider]  ? ?DG Ankle Complete Right ? ?Result Date: 02/14/2022 ?CLINICAL DATA:  Right ankle with EXAM: RIGHT ANKLE - COMPLETE 3+ VIEW COMPARISON:  None. FINDINGS: There is diffuse soft tissue swelling. There is a lateral midfoot/hindfoot wound with adjacent soft tissue gas and lucency and irregularity of the base of the fifth metatarsal. Vascular calcifications. Prior ankle fixation. Intact hardware without evidence of loosening. Extensive vascular calcifications. IMPRESSION: Findings suspicious for lateral midfoot/hindfoot wound with soft tissue infection and underlying osteomyelitis at the base of the fifth metatarsal. Diffuse ankle soft tissue swelling with prior ankle fixation. No evidence of  loosening or periprosthetic fracture. Electronically Signed   By: Maurine Simmering M.D.   On: 02/14/2022 13:14  ? ?DG Foot Complete Right ? ?Result Date: 02/14/2022 ?CLINICAL DATA:  Right foot infection EXAM: RIGHT

## 2022-02-15 NOTE — Procedures (Signed)
Patient was seen on dialysis and the procedure was supervised.  BFR 400  Via AVF BP is  106/50. ? ? Patient appears to be tolerating treatment well now -  UF had to be turned down  ? ?Amy Moses ?02/15/2022 ? ?

## 2022-02-15 NOTE — Progress Notes (Signed)
Hemodialysis- Tolerated well. Approx 1.2L removed without issue. Given vancomycin. Denies pain/complaints. Reported off to [name]. ?

## 2022-02-15 NOTE — Plan of Care (Signed)
  Problem: Education: Goal: Knowledge of General Education information will improve Description: Including pain rating scale, medication(s)/side effects and non-pharmacologic comfort measures Outcome: Not Progressing   Problem: Health Behavior/Discharge Planning: Goal: Ability to manage health-related needs will improve Outcome: Not Progressing   Problem: Clinical Measurements: Goal: Ability to maintain clinical measurements within normal limits will improve Outcome: Not Progressing Goal: Will remain free from infection Outcome: Not Progressing Goal: Diagnostic test results will improve Outcome: Not Progressing Goal: Respiratory complications will improve Outcome: Not Progressing Goal: Cardiovascular complication will be avoided Outcome: Not Progressing   Problem: Activity: Goal: Risk for activity intolerance will decrease Outcome: Not Progressing   Problem: Coping: Goal: Level of anxiety will decrease Outcome: Not Progressing   Problem: Elimination: Goal: Will not experience complications related to bowel motility Outcome: Not Progressing Goal: Will not experience complications related to urinary retention Outcome: Not Progressing   

## 2022-02-15 NOTE — H&P (View-Only) (Signed)
? ?ORTHOPAEDIC CONSULTATION ? ?REQUESTING PHYSICIAN: Shelly Coss, MD ? ?Chief Complaint: Progressive necrotic ulcer lateral right foot. ? ?HPI: ?Amy Moses is a 51 y.o. female who presents with necrotic ulcer base of the right foot laterally as well as a abscess dorsally over the ankle.  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  The right lower extremity has healed significantly better than the left lower extremity however on the right lower extremity lateral foot patient has developed a progressive necrotic ulcer.  Patient has end-stage renal disease on dialysis type 2 diabetes with severe peripheral vascular disease.  Patient has undergone arteriogram studies of both lower extremities and there are no reconstructable lesions. ? ?Past Medical History:  ?Diagnosis Date  ? Anemia   ? Blindness of right eye with low vision in contralateral eye   ? s/p victrectomy  ? Diabetes mellitus, type II ([place])   ? Dyslipidemia   ? Glaucoma   ? History of blood transfusion   ? Hypertension   ? Hypothyroidism (acquired)   ? Kidney disease   ? Stage 5  ? Pneumonia   ? ?Past Surgical History:  ?Procedure Laterality Date  ? ABDOMINAL AORTOGRAM W/LOWER EXTREMITY Bilateral 12/18/2020  ? Procedure: ABDOMINAL AORTOGRAM W/LOWER EXTREMITY;  Surgeon: Elam Dutch, MD;  Location: [place] [facility];  Service: Cardiovascular;  Laterality: Bilateral;  ? ABDOMINAL AORTOGRAM W/LOWER EXTREMITY Bilateral 01/25/2022  ? Procedure: ABDOMINAL AORTOGRAM W/LOWER EXTREMITY;  Surgeon: Serafina Mitchell, MD;  Location: [place] [facility];  Service: Cardiovascular;  Laterality: Bilateral;  ? ANKLE FRACTURE SURGERY Right   ? AV FISTULA PLACEMENT Left 08/18/2020  ? Procedure: LEFT ARM BRACHIOCEPHALIC ARTERIOVENOUS (AV) FISTULA CREATION;  Surgeon: Elam Dutch, MD;  Location: [place];  Service: Vascular;  Laterality: Left;  ? BIOPSY  04/24/2021  ? Procedure: BIOPSY;   Surgeon: Eloise Harman, DO;  [facility];  Service: Endoscopy;;  ? CESAREAN SECTION    ? CHOLECYSTECTOMY    ? COLONOSCOPY  04/24/2021  ? Surgeon: Eloise Harman, DO;  nonbleeding internal hemorrhoids, 1 large (25 mm) pedunculated transverse colon polyp (prolapse type polyp) with adherent clot and stigmata of recent bleed.  ? COLONOSCOPY WITH PROPOFOL N/A 05/14/2021  ? Procedure: COLONOSCOPY WITH PROPOFOL;  Surgeon: Daneil Dolin, MD;  [facility];  Service: Endoscopy;  Laterality: N/A;  ? ESOPHAGOGASTRODUODENOSCOPY (EGD) WITH PROPOFOL N/A 04/24/2021  ? Surgeon: Eloise Harman, DO;  duodenal erosions and gastritis biopsied (pathology with peptic duodenitis, reactive gastropathy with erosions/chronic inflammation, negative for H. pylori)  ? EYE SURGERY    ? Vatrectomy  ? HEMOSTASIS CLIP PLACEMENT  05/14/2021  ? Procedure: HEMOSTASIS CLIP PLACEMENT;  Surgeon: Daneil Dolin, MD;  [facility];  Service: Endoscopy;;  ? IR PERC TUN PERIT CATH WO PORT S&I /IMAG  09/15/2020  ? IR REMOVAL TUN CV CATH W/O FL  02/19/2021  ? IR US GUIDE VASC ACCESS RIGHT  09/15/2020  ? POLYPECTOMY  04/24/2021  ? Procedure: POLYPECTOMY;  Surgeon: Eloise Harman, DO;  [facility];  Service: Endoscopy;;  ? POLYPECTOMY  05/14/2021  ? Procedure: POLYPECTOMY;  Surgeon: Daneil Dolin, MD;  [facility];  Service: Endoscopy;;  ? SKIN SPLIT GRAFT Bilateral 09/03/2021  ? Procedure: SKIN GRAFT BILATERAL LEGS;  Surgeon: Newt Minion, MD;  Location: [place];  Service: Orthopedics;  Laterality: Bilateral;  ? SKIN SPLIT GRAFT Left 12/10/2021  ?  Procedure: IRRIGATION AND DEBRIDEMENT LEFT CALF, APPLICATION SPLIT THICKNESS SKIN GRAFT;  Surgeon: Newt Minion, MD;  Location: [place];  Service: Orthopedics;  Laterality: Left;  ? TOE SURGERY    ? ?Social History  ? ?Socioeconomic History  ? Marital status: Single  ?  Spouse name: Not on file  ? Number of children: Not on file  ? Years of  education: Not on file  ? Highest education level: Not on file  ?Occupational History  ? Not on file  ?Tobacco Use  ? Smoking status: Never  ? Smokeless tobacco: Never  ?Vaping Use  ? Vaping Use: Never used  ?Substance and Sexual Activity  ? Alcohol use: No  ? Drug use: No  ? Sexual activity: Yes  ?  Birth control/protection: Condom  ?Other Topics Concern  ? Not on file  ?Social History Narrative  ? Not on file  ? ?Social Determinants of Health  ? ?Financial Resource Strain: Not on file  ?Food Insecurity: Not on file  ?Transportation Needs: Not on file  ?Physical Activity: Not on file  ?Stress: Not on file  ?Social Connections: Not on file  ? ?Family History  ?Problem Relation Age of Onset  ? Heart disease Mother   ? Diabetes Mother   ? Kidney disease Mother   ? Diabetes Father   ? Heart disease Father   ? Diabetes Brother   ? Colon cancer Neg Hx   ? ?- negative except otherwise stated in the family history section ?Allergies  ?Allergen Reactions  ? Ace Inhibitors Cough  ? ?Prior to Admission medications   ?Medication Sig Start Date End Date Taking? Authorizing Provider  ?acetaminophen (TYLENOL) 500 MG tablet Take 1,000 mg by mouth every 6 (six) hours as needed for moderate pain.   Yes [provider]  ?aspirin EC 81 MG tablet Take 1 tablet (81 mg total) by mouth daily with breakfast. 05/19/21 05/19/22 Yes Johnson, Clanford L, MD  ?atorvastatin (LIPITOR) 10 MG tablet Take 1 tablet (10 mg total) by mouth daily. ?Patient taking differently: Take 10 mg by mouth at bedtime. 04/25/21  Yes Roxan Hockey, MD  ?AURYXIA 1 GM 210 MG(Fe) tablet Take 420 mg by mouth 2 (two) times daily with a meal. 01/07/22  Yes [provider]  ?HUMALOG KWIKPEN 100 UNIT/ML KwikPen Inject 2 Units into the skin 3 (three) times daily with meals. If eats 50% or more of meal. ?Patient taking differently: Inject 5-15 Units into the skin 2 (two) times daily with a meal. Sliding Scale ?If eats 50% or more of meal. 05/15/21  Yes Johnson,  Clanford L, MD  ?insulin glargine (LANTUS) 100 UNIT/ML injection Inject 0.08 mLs (8 Units total) into the skin at bedtime. ?Patient taking differently: Inject 5 Units into the skin at bedtime. 08/11/21  Yes Elodia Florence., MD  ?lactulose [facility]) 10 GM/15ML solution Take 10 g by mouth daily as needed for moderate constipation. 11/30/21  Yes [provider]  ?levothyroxine (SYNTHROID) 50 MCG tablet Take 1 tablet (50 mcg total) by mouth daily before breakfast. 04/25/21  Yes Emokpae, Courage, MD  ?oxyCODONE (OXY IR/ROXICODONE) 5 MG immediate release tablet Take 1 tablet (5 mg total) by mouth every 6 (six) hours as needed for moderate pain. 01/26/22  Yes Sheikh, Omair Latif, DO  ?pantoprazole (PROTONIX) 40 MG tablet Take 1 tablet (40 mg total) by mouth 2 (two) times daily. 04/25/21 04/25/22 Yes Emokpae, Courage, MD  ?pentoxifylline (TRENTAL) 400 MG CR tablet Take 1 tablet (400 mg  total) by mouth 3 (three) times daily with meals. 01/26/22  Yes Sheikh, Omair Latif, DO  ?torsemide (DEMADEX) 100 MG tablet Take 150 mg by mouth daily.  05/22/20  Yes [provider]  ?Vitamin D, Ergocalciferol, (DRISDOL) 1.25 MG (50000 UNIT) CAPS capsule Take 50,000 Units by mouth every 14 (fourteen) days. 04/20/21  Yes [provider]  ? ?DG Ankle Complete Right ? ?Result Date: 02/14/2022 ?CLINICAL DATA:  Right ankle with EXAM: RIGHT ANKLE - COMPLETE 3+ VIEW COMPARISON:  None. FINDINGS: There is diffuse soft tissue swelling. There is a lateral midfoot/hindfoot wound with adjacent soft tissue gas and lucency and irregularity of the base of the fifth metatarsal. Vascular calcifications. Prior ankle fixation. Intact hardware without evidence of loosening. Extensive vascular calcifications. IMPRESSION: Findings suspicious for lateral midfoot/hindfoot wound with soft tissue infection and underlying osteomyelitis at the base of the fifth metatarsal. Diffuse ankle soft tissue swelling with prior ankle fixation. No evidence of  loosening or periprosthetic fracture. Electronically Signed   By: Maurine Simmering M.D.   On: 02/14/2022 13:14  ? ?DG Foot Complete Right ? ?Result Date: 02/14/2022 ?CLINICAL DATA:  Right foot infection EXAM: RIGHT

## 2022-02-15 NOTE — Progress Notes (Signed)
Subjective:  Seen in HD - no c/o's-  I see note from [name] to have TMA tomorrow- pt thinks Friday ?   ?Objective ?Vital signs in last 24 hours: ?Vitals:  ? 02/15/22 0740 02/15/22 0746 02/15/22 0800 02/15/22 0830  ?BP: 122/80 120/76 113/70 (!) 101/58  ?Pulse: 71 69 69 66  ?Resp: 19 20 (!) 21   ?Temp: 98 ?F (36.7 ?C)     ?TempSrc: Oral     ?SpO2: 98%  100%   ?Weight: 93.7 kg     ?Height:      ? ?Weight change:  ? ?Intake/Output Summary (Last 24 hours) at 02/15/2022 0928 ?Last data filed at 02/15/2022 0000 ?Gross per 24 hour  ?Intake 730.59 ml  ?Output --  ?Net 730.59 ml  ? ? ?Dialyzes at [place]  says 5 hours ?  Been on HD for a year and a half  EDW 95.5. ?HD Bath 2/2.5, Dialyzer unknown, Heparin yes. Access left upper AVF. ? ? ?Assessment/Plan: 51 year old BF with DM and ESRD-  has had complications of foot ulcer  ?1 Foot ulcer-  cultures and started on rocephin /flagyl and vanc.  Dr. [name] to consider surgery-  planning on TMA either Wed or Friday ?2 ESRD: normally TTS via AVF-  will plan on HD today and Thurs on schedule ?3 Hypertension/volume: BP is fine/low on no BP meds-  has pitting edema - alb 2.6-  will try to challenge as able but difficult due to low BPS.  Is on torsemide ?  Must make some urine ?4. Anemia of ESRD: hgb greater than 11-  no treatment needed for now ?5. Metabolic Bone Disease: continuing auryxia-  does not seem to be on vitamin D as OP ? ? ?Amy Moses  ? ? ?Labs: ?Basic Metabolic Panel: ?Recent Labs  ?Lab 02/14/22 ?1231 02/15/22 ?0641  ?NA 135 134*  ?K 3.7 3.8  ?CL 97* 94*  ?CO2 26 24  ?GLUCOSE 229* 351*  ?BUN 47* 56*  ?CREATININE 6.21* 6.84*  ?CALCIUM 8.9 8.5*  ?PHOS  --  6.9*  ? ?Liver Function Tests: ?Recent Labs  ?Lab 02/14/22 ?1231 02/15/22 ?0641  ?AST 12*  --   ?ALT 11  --   ?ALKPHOS 110  --   ?BILITOT 0.6  --   ?PROT 6.5  --   ?ALBUMIN 2.9* 2.6*  ? ?No results for input(s): LIPASE, AMYLASE in the last 168 hours. ?No results for input(s): AMMONIA in the last  168 hours. ?CBC: ?Recent Labs  ?Lab 02/14/22 ?1231 02/15/22 ?0641  ?WBC 8.0 7.1  ?NEUTROABS 5.8  --   ?HGB 11.1* 11.0*  ?HCT 36.4 35.0*  ?MCV 88.3 87.1  ?PLT 240 227  ? ?Cardiac Enzymes: ?No results for input(s): CKTOTAL, CKMB, CKMBINDEX, TROPONINI in the last 168 hours. ?CBG: ?Recent Labs  ?Lab 02/14/22 ?2148  ?GLUCAP 318*  ? ? ?Iron Studies: No results for input(s): IRON, TIBC, TRANSFERRIN, FERRITIN in the last 72 hours. ?Studies/Results: ?DG Ankle Complete Right ? ?Result Date: 02/14/2022 ?CLINICAL DATA:  Right ankle with EXAM: RIGHT ANKLE - COMPLETE 3+ VIEW COMPARISON:  None. FINDINGS: There is diffuse soft tissue swelling. There is a lateral midfoot/hindfoot wound with adjacent soft tissue gas and lucency and irregularity of the base of the fifth metatarsal. Vascular calcifications. Prior ankle fixation. Intact hardware without evidence of loosening. Extensive vascular calcifications. IMPRESSION: Findings suspicious for lateral midfoot/hindfoot wound with soft tissue infection and underlying osteomyelitis at the base of the fifth metatarsal. Diffuse ankle  soft tissue swelling with prior ankle fixation. No evidence of loosening or periprosthetic fracture. Electronically Signed   By: Maurine Simmering M.D.   On: 02/14/2022 13:14  ? ?DG Foot Complete Right ? ?Result Date: 02/14/2022 ?CLINICAL DATA:  Right foot infection EXAM: RIGHT FOOT COMPLETE - 3+ VIEW COMPARISON:  None. FINDINGS: There is a soft tissue defect and soft tissue gas along the lateral midfoot with adjacent lucency and irregularity of the base of of the fifth metatarsal. Chronic fracture deformities of the second through fourth metatarsals. Diffuse osteopenia. Hallux valgus with severe first MTP arthritis. Extensive vascular calcifications. Diffuse soft tissue swelling. Plantar calcaneal spurring. Prior distal tibia and fibular fixation. IMPRESSION: Findings suspicious for lateral midfoot ulcer and soft tissue infection with underlying osteomyelitis at the  base of the fifth metatarsal. Electronically Signed   By: Maurine Simmering M.D.   On: 02/14/2022 13:12   ?Medications: ?Infusions: ? sodium chloride    ? sodium chloride    ? cefTRIAXone (ROCEPHIN)  IV Stopped (02/14/22 2217)  ? And  ? metronidazole Stopped (02/14/22 2135)  ? vancomycin    ? ? ?Scheduled Medications: ? acidophilus  2 capsule Oral TID  ? aspirin EC  81 mg Oral Q breakfast  ? atorvastatin  10 mg Oral QHS  ? Chlorhexidine Gluconate Cloth  6 each Topical Q0600  ? ferric citrate  420 mg Oral BID WC  ? heparin  5,000 Units Subcutaneous Q8H  ? insulin aspart  0-15 Units Subcutaneous TID WC  ? insulin glargine-yfgn  5 Units Subcutaneous QHS  ? levothyroxine  50 mcg Oral QAC breakfast  ? pantoprazole  40 mg Oral BID  ? pentoxifylline  400 mg Oral TID WC  ? sodium chloride flush  3 mL Intravenous Q12H  ? torsemide  150 mg Oral Daily  ? ? have reviewed scheduled and prn medications. ? ?Physical Exam: ?General:  NAD ?Heart: RRR ?Lungs: dec BS at extreme bases ?Abdomen: soft, non tender ?Extremities: compression hose-  has pitting edema in thighs  ?Dialysis Access: left upper AVF  ? ? ?02/15/2022,9:28 AM ? LOS: 1 day  ? ?  ? ? ? ? ?

## 2022-02-16 ENCOUNTER — Encounter (HOSPITAL_COMMUNITY)
Admission: EM | Disposition: A | Discharge status: Home / Self Care | Payer: Self-pay | Source: Home / Self Care | Attending: Family Medicine

## 2022-02-16 ENCOUNTER — Other Ambulatory Visit: Payer: Self-pay

## 2022-02-16 ENCOUNTER — Inpatient Hospital Stay (HOSPITAL_COMMUNITY): Payer: Medicare Other | Admitting: Certified Registered Nurse Anesthetist

## 2022-02-16 ENCOUNTER — Ambulatory Visit: Payer: Self-pay | Admitting: "Endocrinology

## 2022-02-16 ENCOUNTER — Encounter (HOSPITAL_COMMUNITY): Payer: Self-pay | Admitting: Internal Medicine

## 2022-02-16 DIAGNOSIS — E1169 Type 2 diabetes mellitus with other specified complication: Secondary | ICD-10-CM

## 2022-02-16 DIAGNOSIS — M869 Osteomyelitis, unspecified: Secondary | ICD-10-CM

## 2022-02-16 DIAGNOSIS — E1152 Type 2 diabetes mellitus with diabetic peripheral angiopathy with gangrene: Secondary | ICD-10-CM

## 2022-02-16 DIAGNOSIS — L02611 Cutaneous abscess of right foot: Secondary | ICD-10-CM

## 2022-02-16 DIAGNOSIS — Z794 Long term (current) use of insulin: Secondary | ICD-10-CM

## 2022-02-16 DIAGNOSIS — N186 End stage renal disease: Secondary | ICD-10-CM | POA: Diagnosis not present

## 2022-02-16 DIAGNOSIS — M86271 Subacute osteomyelitis, right ankle and foot: Secondary | ICD-10-CM | POA: Diagnosis not present

## 2022-02-16 HISTORY — PX: AMPUTATION: SHX166

## 2022-02-16 LAB — RENAL FUNCTION PANEL
Albumin: 2.5 g/dL — ABNORMAL LOW (ref 3.5–5.0)
Anion gap: 14 (ref 5–15)
BUN: 32 mg/dL — ABNORMAL HIGH (ref 6–20)
CO2: 24 mmol/L (ref 22–32)
Calcium: 8.5 mg/dL — ABNORMAL LOW (ref 8.9–10.3)
Chloride: 96 mmol/L — ABNORMAL LOW (ref 98–111)
Creatinine, Ser: 5.18 mg/dL — ABNORMAL HIGH (ref 0.44–1.00)
GFR, Estimated: 10 mL/min — ABNORMAL LOW (ref >60)
Glucose, Bld: 180 mg/dL — ABNORMAL HIGH (ref 70–99)
Phosphorus: 4.9 mg/dL — ABNORMAL HIGH (ref 2.5–4.6)
Potassium: 3.7 mmol/L (ref 3.5–5.1)
Sodium: 134 mmol/L — ABNORMAL LOW (ref 135–145)

## 2022-02-16 LAB — GLUCOSE, CAPILLARY
Glucose-Capillary: 241 mg/dL — ABNORMAL HIGH (ref 70–99)
Glucose-Capillary: 333 mg/dL — ABNORMAL HIGH (ref 70–99)
Glucose-Capillary: 190 mg/dL — ABNORMAL HIGH (ref 70–99)
Glucose-Capillary: 149 mg/dL — ABNORMAL HIGH (ref 70–99)
Glucose-Capillary: 153 mg/dL — ABNORMAL HIGH (ref 70–99)
Glucose-Capillary: 171 mg/dL — ABNORMAL HIGH (ref 70–99)

## 2022-02-16 LAB — SURGICAL PCR SCREEN
MRSA, PCR: NEGATIVE
Staphylococcus aureus: NEGATIVE

## 2022-02-16 LAB — HEPATITIS B SURFACE ANTIBODY, QUANTITATIVE: Hep B S AB Quant (Post): <3.1 m[IU]/mL — ABNORMAL LOW (ref >9.9)

## 2022-02-16 SURGERY — AMPUTATION, FOOT, RAY
Anesthesia: Monitor Anesthesia Care | Laterality: Right

## 2022-02-16 MED ORDER — JUVEN PO PACK
1.0000 | PACK | Freq: Two times a day (BID) | ORAL | Status: DC
  Administered 2022-02-18: 1 via ORAL
  Filled 2022-02-16 (×2): qty 1

## 2022-02-16 MED ORDER — ASCORBIC ACID 500 MG PO TABS
1000.0000 mg | ORAL_TABLET | Freq: Every day | ORAL | Status: DC
  Administered 2022-02-16 – 2022-02-18 (×3): 1000 mg via ORAL
  Filled 2022-02-16 (×3): qty 2

## 2022-02-16 MED ORDER — BUPIVACAINE HCL (PF) 0.5 % IJ SOLN
INTRAMUSCULAR | Status: AC
  Filled 2022-02-16: qty 30

## 2022-02-16 MED ORDER — LABETALOL HCL 5 MG/ML IV SOLN: 10.0000 mg | INTRAVENOUS | Status: DC | PRN

## 2022-02-16 MED ORDER — POTASSIUM CHLORIDE CRYS ER 20 MEQ PO TBCR: 20.0000 meq | EXTENDED_RELEASE_TABLET | Freq: Every day | ORAL | Status: DC | PRN

## 2022-02-16 MED ORDER — CHLORHEXIDINE GLUCONATE 0.12 % MT SOLN
15.0000 mL | OROMUCOSAL | Status: AC
  Administered 2022-02-16: 15 mL via OROMUCOSAL
  Filled 2022-02-16: qty 15

## 2022-02-16 MED ORDER — FENTANYL CITRATE (PF) 250 MCG/5ML IJ SOLN
INTRAMUSCULAR | Status: AC
  Filled 2022-02-16: qty 5

## 2022-02-16 MED ORDER — GUAIFENESIN-DM 100-10 MG/5ML PO SYRP: 15.0000 mL | ORAL_SOLUTION | ORAL | Status: DC | PRN

## 2022-02-16 MED ORDER — ONDANSETRON HCL 4 MG/2ML IJ SOLN
4.0000 mg | Freq: Four times a day (QID) | INTRAMUSCULAR | Status: DC | PRN
  Administered 2022-02-16: 4 mg via INTRAVENOUS
  Filled 2022-02-16: qty 2

## 2022-02-16 MED ORDER — PHENOL 1.4 % MT LIQD: 1.0000 | OROMUCOSAL | Status: DC | PRN

## 2022-02-16 MED ORDER — MAGNESIUM SULFATE 2 GM/50ML IV SOLN: 2.0000 g | Freq: Every day | INTRAVENOUS | Status: DC | PRN

## 2022-02-16 MED ORDER — ACETAMINOPHEN 325 MG PO TABS
325.0000 mg | ORAL_TABLET | Freq: Four times a day (QID) | ORAL | Status: DC | PRN
  Filled 2022-02-16: qty 2

## 2022-02-16 MED ORDER — MAGNESIUM CITRATE PO SOLN
1.0000 | Freq: Once | ORAL | Status: DC | PRN
  Filled 2022-02-16: qty 296

## 2022-02-16 MED ORDER — SODIUM CHLORIDE 0.9 % IV SOLN: INTRAVENOUS | Status: DC

## 2022-02-16 MED ORDER — MIDAZOLAM HCL 2 MG/2ML IJ SOLN
INTRAMUSCULAR | Status: AC
  Filled 2022-02-16: qty 2

## 2022-02-16 MED ORDER — DROPERIDOL 2.5 MG/ML IJ SOLN: 0.6250 mg | Freq: Once | INTRAMUSCULAR | Status: DC | PRN

## 2022-02-16 MED ORDER — BUPIVACAINE HCL 0.5 % IJ SOLN
INTRAMUSCULAR | Status: DC | PRN
  Administered 2022-02-16: 20 mL

## 2022-02-16 MED ORDER — CHLORHEXIDINE GLUCONATE CLOTH 2 % EX PADS
6.0000 | MEDICATED_PAD | Freq: Every day | CUTANEOUS | Status: DC
  Administered 2022-02-17 – 2022-02-18 (×2): 6 via TOPICAL

## 2022-02-16 MED ORDER — MORPHINE SULFATE (PF) 2 MG/ML IV SOLN: 0.5000 mg | INTRAVENOUS | Status: DC | PRN

## 2022-02-16 MED ORDER — POVIDONE-IODINE 10 % EX SWAB
2.0000 "application " | Freq: Once | CUTANEOUS | Status: AC
  Administered 2022-02-16: 2 via TOPICAL

## 2022-02-16 MED ORDER — PROPOFOL 10 MG/ML IV BOLUS
INTRAVENOUS | Status: AC
  Filled 2022-02-16: qty 20

## 2022-02-16 MED ORDER — PROPOFOL 10 MG/ML IV BOLUS
INTRAVENOUS | Status: DC | PRN
  Administered 2022-02-16 (×4): 20 mg via INTRAVENOUS

## 2022-02-16 MED ORDER — POLYETHYLENE GLYCOL 3350 17 G PO PACK: 17.0000 g | PACK | Freq: Every day | ORAL | Status: DC | PRN

## 2022-02-16 MED ORDER — ZINC SULFATE 220 (50 ZN) MG PO CAPS
220.0000 mg | ORAL_CAPSULE | Freq: Every day | ORAL | Status: DC
Start: 2022-02-16 — End: 2022-02-19
  Administered 2022-02-16 – 2022-02-18 (×3): 220 mg via ORAL
  Filled 2022-02-16 (×3): qty 1

## 2022-02-16 MED ORDER — 0.9 % SODIUM CHLORIDE (POUR BTL) OPTIME
TOPICAL | Status: DC | PRN
  Administered 2022-02-16: 1000 mL

## 2022-02-16 MED ORDER — CHLORHEXIDINE GLUCONATE 4 % EX LIQD: 60.0000 mL | Freq: Once | CUTANEOUS | Status: DC

## 2022-02-16 MED ORDER — HYDROCODONE-ACETAMINOPHEN 5-325 MG PO TABS: 1.0000 | ORAL_TABLET | ORAL | Status: DC | PRN

## 2022-02-16 MED ORDER — CEFAZOLIN SODIUM-DEXTROSE 2-4 GM/100ML-% IV SOLN
2.0000 g | INTRAVENOUS | Status: DC
  Filled 2022-02-16: qty 100

## 2022-02-16 MED ORDER — PANTOPRAZOLE SODIUM 40 MG PO TBEC: 40.0000 mg | DELAYED_RELEASE_TABLET | Freq: Every day | ORAL | Status: DC

## 2022-02-16 MED ORDER — DOCUSATE SODIUM 100 MG PO CAPS
100.0000 mg | ORAL_CAPSULE | Freq: Every day | ORAL | Status: DC
  Administered 2022-02-17 – 2022-02-18 (×2): 100 mg via ORAL
  Filled 2022-02-16 (×2): qty 1

## 2022-02-16 MED ORDER — HYDRALAZINE HCL 20 MG/ML IJ SOLN: 5.0000 mg | INTRAMUSCULAR | Status: DC | PRN

## 2022-02-16 MED ORDER — FENTANYL CITRATE (PF) 100 MCG/2ML IJ SOLN: 25.0000 ug | INTRAMUSCULAR | Status: DC | PRN

## 2022-02-16 MED ORDER — ALUM & MAG HYDROXIDE-SIMETH 200-200-20 MG/5ML PO SUSP: 15.0000 mL | ORAL | Status: DC | PRN

## 2022-02-16 MED ORDER — HYDROCODONE-ACETAMINOPHEN 7.5-325 MG PO TABS
1.0000 | ORAL_TABLET | ORAL | Status: DC | PRN
  Administered 2022-02-16: 2 via ORAL
  Filled 2022-02-16: qty 2

## 2022-02-16 MED ORDER — METOPROLOL TARTRATE 5 MG/5ML IV SOLN: 2.0000 mg | INTRAVENOUS | Status: DC | PRN

## 2022-02-16 MED ORDER — BISACODYL 5 MG PO TBEC: 5.0000 mg | DELAYED_RELEASE_TABLET | Freq: Every day | ORAL | Status: DC | PRN

## 2022-02-16 SURGICAL SUPPLY — 31 items
BLADE SAW SGTL MED 73X18.5 STR (BLADE) IMPLANT
BLADE SURG 21 STRL SS (BLADE) ×2 IMPLANT
BNDG COHESIVE 4X5 TAN ST LF (GAUZE/BANDAGES/DRESSINGS) ×1 IMPLANT
CANISTER WOUNDNEG PRESSURE 500 (CANNISTER) ×1 IMPLANT
COVER SURGICAL LIGHT HANDLE (MISCELLANEOUS) ×4 IMPLANT
DRAPE DERMATAC (DRAPES) ×2 IMPLANT
DRAPE U-SHAPE 47X51 STRL (DRAPES) ×4 IMPLANT
DRESSING PEEL AND PLC PRVNA 13 (GAUZE/BANDAGES/DRESSINGS) IMPLANT
DRESSING VERAFLO CLEANS CC MED (GAUZE/BANDAGES/DRESSINGS) IMPLANT
DRSG PEEL AND PLACE PREVENA 13 (GAUZE/BANDAGES/DRESSINGS) ×2
DRSG VERAFLO CLEANSE CC MED (GAUZE/BANDAGES/DRESSINGS) ×2
DURAPREP 26ML APPLICATOR (WOUND CARE) ×2 IMPLANT
ELECT REM PT RETURN 9FT ADLT (ELECTROSURGICAL) ×2
ELECTRODE REM PT RTRN 9FT ADLT (ELECTROSURGICAL) ×1 IMPLANT
GLOVE SURG ORTHO LTX SZ9 (GLOVE) ×2 IMPLANT
GLOVE SURG UNDER POLY LF SZ9 (GLOVE) ×2 IMPLANT
GOWN STRL REUS W/ TWL XL LVL3 (GOWN DISPOSABLE) ×2 IMPLANT
GOWN STRL REUS W/TWL XL LVL3 (GOWN DISPOSABLE) ×4
GRAFT SKIN WND 7X10 FENS (Tissue) ×1 IMPLANT
GRAFT SKIN WND MICRO 38 (Tissue) ×1 IMPLANT
KIT BASIN OR (CUSTOM PROCEDURE TRAY) ×2 IMPLANT
KIT TURNOVER KIT B (KITS) ×2 IMPLANT
NS IRRIG 1000ML POUR BTL (IV SOLUTION) ×2 IMPLANT
PACK ORTHO EXTREMITY (CUSTOM PROCEDURE TRAY) ×2 IMPLANT
PAD ARMBOARD 7.5X6 YLW CONV (MISCELLANEOUS) ×4 IMPLANT
PAD NEG PRESSURE SENSATRAC (MISCELLANEOUS) ×1 IMPLANT
STOCKINETTE IMPERVIOUS LG (DRAPES) IMPLANT
SUT ETHILON 2 0 PSLX (SUTURE) ×2 IMPLANT
TOWEL GREEN STERILE (TOWEL DISPOSABLE) ×2 IMPLANT
TUBE CONNECTING 12X1/4 (SUCTIONS) ×2 IMPLANT
YANKAUER SUCT BULB TIP NO VENT (SUCTIONS) ×2 IMPLANT

## 2022-02-16 NOTE — Op Note (Signed)
02/16/2022 ? ?2:34 PM ? ?PATIENT:  Amy Moses   ? ?PRE-OPERATIVE DIAGNOSIS:  Osteomyelitis, abscess, gangrene Right Foot ? ?POST-OPERATIVE DIAGNOSIS:  Same ? ?PROCEDURE:  RIGHT FOOT 5TH RAY AMPUTATION ?Excisional debridement skin soft tissue muscle and abscess. ?Local tissue rearrangement for wound closure 2 x 5 cm. ?Application of cleanse choice wound VAC sponge. ? ?SURGEON:  Newt Minion, MD ? ?PHYSICIAN ASSISTANT:None ?ANESTHESIA:   General ? ?PREOPERATIVE INDICATIONS:  Amy Moses is a  51 y.o. female with a diagnosis of Osteomyelitis, Gangrene Right Foot who failed conservative measures and elected for surgical management.   ? ?The risks benefits and alternatives were discussed with the patient preoperatively including but not limited to the risks of infection, bleeding, nerve injury, cardiopulmonary complications, the need for revision surgery, among others, and the patient was willing to proceed. ? ?OPERATIVE IMPLANTS: Kerecis powder 38 cm? and Kerecis tissue graft 7 x 10 cm. ? ?_0 @ ? ?OPERATIVE FINDINGS: Patient had extensive necrotic skin muscle and tissue and abscess.  Tissue was sent for cultures.  Tissue margins were healthy viable and bleeding. ? ?OPERATIVE PROCEDURE: Patient was brought the operating room and underwent a MAC anesthetic.  The right lower extremity was then prepped using DuraPrep draped into a sterile field a timeout was called.  Patient underwent a digital block with 20 cc of quarter percent Marcaine plain.  After adequate levels anesthesia were obtained patient underwent 5 ray amputation.  The plantar wound margin was necrotic with purulence.  Patient underwent excision of the necrotic tissue back to healthy viable tissue.  This left a wound that was 3 x 10 cm.  Electrocautery was used for hemostasis.  The wounds was cleansed with normal saline.  The tissue was sent for cultures.  The wound was then filled with 38 cm? of Kerecis micro powder this was covered with the 7  x 10 cm Kerecis graft.  This was then covered with the cleanse choice wound VAC sponge.  Distally local tissue rearrangement undermining and additional incision was made to close the distal aspect of the wound that was 5 x 10 cm.  This was all covered with derma tack connected to the [facility] this had a good suction fit.  This was overwrapped with Coban patient was taken to PACU in stable condition. ? ?Debridement type: Excisional Debridement ? ?Side: right ? ?Body Location: foot  ? ?Tools used for debridement: scalpel ? ?Pre-debridement Wound size (cm):   Length: 2        Width: 2     Depth: 1  ? ?Post-debridement Wound size (cm):   Length: 17        Width: 3     Depth: 2  ? ?Debridement depth beyond dead/damaged tissue down to healthy viable tissue: yes ? ?Tissue layer involved: skin, subcutaneous tissue, muscle / fascia ? ?Nature of tissue removed: Slough, Necrotic, Devitalized Tissue, Non-viable tissue, and Purulence ? ?Irrigation volume: 1 L    ? ?Irrigation fluid type: Normal Saline ? ? ? ?DISCHARGE PLANNING: ? ?Antibiotic duration: Continue IV antibiotics until cultures are finalized.  Anticipate discharge to home on oral antibiotics on Monday. ? ?Weightbearing: Nonweightbearing on the right. ? ?Pain medication: Opioid pathway ? ?Dressing care/ Wound VAC: Continue wound VAC and discharge. ? ?Ambulatory devices: Walker ? ?Discharge to: Anticipate discharge to home. ? ?Follow-up: In the office 1 week post operative. ? ? ? ? ? ? ?  ?

## 2022-02-16 NOTE — Interval H&P Note (Signed)
History and Physical Interval Note: ? ?02/16/2022 ?6:53 AM ? ?Amy Moses  has presented today for surgery, with the diagnosis of Osteomyelitis, Gangrene Right Foot.  The various methods of treatment have been discussed with the patient and family. After consideration of risks, benefits and other options for treatment, the patient has consented to  Procedure(s): ?RIGHT FOOT 5TH RAY AMPUTATION (Right) as a surgical intervention.  The patient's history has been reviewed, patient examined, no change in status, stable for surgery.  I have reviewed the patient's chart and labs.  Questions were answered to the patient's satisfaction.   ? ? ?Newt Minion ? ? ?

## 2022-02-16 NOTE — Anesthesia Preprocedure Evaluation (Addendum)
Anesthesia Evaluation  ?Patient identified by MRN, date of birth, ID band ?Patient awake ? ? ? ?Reviewed: ?Allergy & Precautions, NPO status , Patient's Chart, lab work & pertinent test results ? ?History of Anesthesia Complications ?Negative for: history of anesthetic complications ? ?Airway ?Mallampati: II ? ?TM Distance: >3 FB ?Neck ROM: Full ? ? ? Dental ? ?(+) Dental Advisory Given ?  ?Pulmonary ?pneumonia,  ?  ?Pulmonary exam normal ?breath sounds clear to auscultation ? ? ? ? ? ? Cardiovascular ?hypertension, +CHF  ?Normal cardiovascular exam+ Valvular Problems/Murmurs  ?Rhythm:Regular Rate:Normal ? ? ?Echo 03/2021 ??1. Left ventricular ejection fraction, by estimation, is 55 to 60%. The left ventricle has normal function. The left ventricle has no regional wall motion abnormalities. Left ventricular diastolic parameters are consistent with Grade II diastolic dysfunction (pseudonormalization).  ??2. Right ventricular systolic function is normal. The right ventricular size is normal.  ??3. Left atrial size was mildly dilated.  ??4. A small pericardial effusion is present.  ??5. The mitral valve is normal in structure. No evidence of mitral valve regurgitation.  ??6. Tricuspid valve regurgitation is mild to moderate.  ??7. The aortic valve is normal in structure. Aortic valve regurgitation is not visualized.  ?  ?Neuro/Psych ?negative neurological ROS ? negative psych ROS  ? GI/Hepatic ?Neg liver ROS, GERD  Medicated,  ?Endo/Other  ?diabetes, Insulin DependentHypothyroidism  ?Obesity ? ? Renal/GU ?ESRF and DialysisRenal disease  ? ?  ?Musculoskeletal ?negative musculoskeletal ROS ?(+)  ? Abdominal ?  ?Peds ? Hematology ? ?(+) Blood dyscrasia, anemia ,   ?Anesthesia Other Findings ? ? Reproductive/Obstetrics ? ?  ? ? ? ? ? ? ? ? ? ? ? ? ? ?  ?  ? ? ? ? ? ? ?Anesthesia Physical ? ?Anesthesia Plan ? ?ASA: 3 ? ?Anesthesia Plan: MAC  ? ?Post-op Pain Management: Minimal or no pain  anticipated  ? ?Induction: Intravenous ? ?PONV Risk Score and Plan: 2 and Treatment may vary due to age or medical condition, Ondansetron and Propofol infusion ? ?Airway Management Planned: Natural Airway ? ?Additional Equipment: None ? ?Intra-op Plan:  ? ?Post-operative Plan:  ? ?Informed Consent: I have reviewed the patients History and Physical, chart, labs and discussed the procedure including the risks, benefits and alternatives for the proposed anesthesia with the patient or authorized representative who has indicated his/her understanding and acceptance.  ? ? ? ?Dental advisory given ? ?Plan Discussed with: CRNA ? ?Anesthesia Plan Comments:   ? ? ? ?Anesthesia Quick Evaluation ? ?

## 2022-02-16 NOTE — Progress Notes (Addendum)
Inpatient Diabetes Program Recommendations ? ?AACE/ADA: New Consensus Statement on Inpatient Glycemic Control (2015) ? ?Target Ranges:  Prepandial:   less than 140 mg/dL ?     Peak postprandial:   less than 180 mg/dL (1-2 hours) ?     Critically ill patients:  140 - 180 mg/dL  ? ?Lab Results  ?Component Value Date  ? GLUCAP 333 (H) 02/16/2022  ? HGBA1C 11.5 (H) 02/15/2022  ? ? ?Review of Glycemic Control ? Latest Reference Range & Units 02/15/22 12:10 02/15/22 18:49 02/15/22 21:37 02/16/22 07:41 02/16/22 09:52  ?Glucose-Capillary 70 - 99 mg/dL 223 (H) 292 (H) 175 (H) 241 (H) 333 (H)  ? ?Diabetes history: DM 2 ?Outpatient Diabetes medications:  ?Humalog 2 units tid with meals, Lantus 8 units q HS ?Current orders for Inpatient glycemic control:  ?Novolog moderate tid with meals ?Semglee 5 units q HS ? ?Inpatient Diabetes Program Recommendations:   ? ?Consider increasing Semglee to 8 units daily.  Also please reduce Novolog correction to very sensitive (0-6 units) but increase frequency to q 4 hours.  Once diet resumed, patient will need meal coverage 2 units tid with meals.  ? ?Thanks,  ?Adah Perl, RN, BC-ADM ?Inpatient Diabetes Coordinator ?Pager [phone]  (8a-5p) ? ? ?

## 2022-02-16 NOTE — Progress Notes (Signed)
Anesthesia made aware of CBG 190. No new orders at this time.  ?

## 2022-02-16 NOTE — Progress Notes (Signed)
Subjective:  Seen on her way to the OR_  no new c/o's  HD yest- removed 1100-   tolerated well  ?Objective ?Vital signs in last 24 hours: ?Vitals:  ? 02/15/22 1100 02/15/22 1120 02/15/22 2102 02/16/22 0739  ?BP: 116/67 122/73 109/69 105/66  ?Pulse: 65 67 70   ?Resp:  20 16 15   ?Temp:  98 ?F (36.7 ?C) 97.9 ?F (36.6 ?C) (!) 97.5 ?F (36.4 ?C)  ?TempSrc:  Oral Oral Oral  ?SpO2: 100% 100% 100% 99%  ?Weight:  91.9 kg    ?Height:      ? ?Weight change: 2.4 kg ? ?Intake/Output Summary (Last 24 hours) at 02/16/2022 1227 ?Last data filed at 02/16/2022 0400 ?Gross per 24 hour  ?Intake 729.37 ml  ?Output --  ?Net 729.37 ml  ? ? ?Dialyzes at [place]  says 5 hours ?  Been on HD for a year and a half  EDW 95.5. ?HD Bath 2/2.5, Dialyzer unknown, Heparin yes. Access left upper AVF. ? ? ?Assessment/Plan: 51 year old BF with DM and ESRD-  has had complications of foot ulcer  ?1 Foot ulcer-  cultures and started on rocephin /flagyl and vanc.  Dr. [name] involved-  planning on limited amputation today  ?2 ESRD: normally TTS via AVF-  will plan on HD  Thurs on schedule ?3 Hypertension/volume: BP is fine/low on no BP meds-  has pitting edema - alb 2.6-  will try to challenge as able but difficult due to low BPS.  Is on torsemide as OP?  Must make some urine ?4. Anemia of ESRD: hgb greater than 11-  no treatment needed for now ?5. Metabolic Bone Disease: continuing auryxia-  does not seem to be on vitamin D as OP ? ? ?Louis Meckel  ? ? ?Labs: ?Basic Metabolic Panel: ?Recent Labs  ?Lab 02/14/22 ?1231 02/15/22 ?0641 02/16/22 ?0245  ?NA 135 134* 134*  ?K 3.7 3.8 3.7  ?CL 97* 94* 96*  ?CO2 26 24 24   ?GLUCOSE 229* 351* 180*  ?BUN 47* 56* 32*  ?CREATININE 6.21* 6.84* 5.18*  ?CALCIUM 8.9 8.5* 8.5*  ?PHOS  --  6.9* 4.9*  ? ?Liver Function Tests: ?Recent Labs  ?Lab 02/14/22 ?1231 02/15/22 ?0641 02/16/22 ?0245  ?AST 12*  --   --   ?ALT 11  --   --   ?ALKPHOS 110  --   --   ?BILITOT 0.6  --   --   ?PROT 6.5  --   --   ?ALBUMIN  2.9* 2.6* 2.5*  ? ?No results for input(s): LIPASE, AMYLASE in the last 168 hours. ?No results for input(s): AMMONIA in the last 168 hours. ?CBC: ?Recent Labs  ?Lab 02/14/22 ?1231 02/15/22 ?0641  ?WBC 8.0 7.1  ?NEUTROABS 5.8  --   ?HGB 11.1* 11.0*  ?HCT 36.4 35.0*  ?MCV 88.3 87.1  ?PLT 240 227  ? ?Cardiac Enzymes: ?No results for input(s): CKTOTAL, CKMB, CKMBINDEX, TROPONINI in the last 168 hours. ?CBG: ?Recent Labs  ?Lab 02/15/22 ?1210 02/15/22 ?1849 02/15/22 ?2137 02/16/22 ?2542 02/16/22 ?7062  ?GLUCAP 223* 292* 175* 241* 333*  ? ? ?Iron Studies: No results for input(s): IRON, TIBC, TRANSFERRIN, FERRITIN in the last 72 hours. ?Studies/Results: ?DG Ankle Complete Right ? ?Result Date: 02/14/2022 ?CLINICAL DATA:  Right ankle with EXAM: RIGHT ANKLE - COMPLETE 3+ VIEW COMPARISON:  None. FINDINGS: There is diffuse soft tissue swelling. There is a lateral midfoot/hindfoot wound with adjacent soft tissue gas and lucency and irregularity of  the base of the fifth metatarsal. Vascular calcifications. Prior ankle fixation. Intact hardware without evidence of loosening. Extensive vascular calcifications. IMPRESSION: Findings suspicious for lateral midfoot/hindfoot wound with soft tissue infection and underlying osteomyelitis at the base of the fifth metatarsal. Diffuse ankle soft tissue swelling with prior ankle fixation. No evidence of loosening or periprosthetic fracture. Electronically Signed   By: Maurine Simmering M.D.   On: 02/14/2022 13:14  ? ?DG Foot Complete Right ? ?Result Date: 02/14/2022 ?CLINICAL DATA:  Right foot infection EXAM: RIGHT FOOT COMPLETE - 3+ VIEW COMPARISON:  None. FINDINGS: There is a soft tissue defect and soft tissue gas along the lateral midfoot with adjacent lucency and irregularity of the base of of the fifth metatarsal. Chronic fracture deformities of the second through fourth metatarsals. Diffuse osteopenia. Hallux valgus with severe first MTP arthritis. Extensive vascular calcifications. Diffuse  soft tissue swelling. Plantar calcaneal spurring. Prior distal tibia and fibular fixation. IMPRESSION: Findings suspicious for lateral midfoot ulcer and soft tissue infection with underlying osteomyelitis at the base of the fifth metatarsal. Electronically Signed   By: Maurine Simmering M.D.   On: 02/14/2022 13:12   ?Medications: ?Infusions: ?  ceFAZolin (ANCEF) IV    ? cefTRIAXone (ROCEPHIN)  IV 2 g (02/15/22 2245)  ? And  ? metronidazole 500 mg (02/16/22 1009)  ? vancomycin Stopped (02/15/22 1202)  ? ? ?Scheduled Medications: ? acidophilus  2 capsule Oral TID  ? aspirin EC  81 mg Oral Q breakfast  ? atorvastatin  10 mg Oral QHS  ? chlorhexidine  60 mL Topical Once  ? Chlorhexidine Gluconate Cloth  6 each Topical Q0600  ? ferric citrate  420 mg Oral BID WC  ? heparin  5,000 Units Subcutaneous Q8H  ? insulin aspart  0-15 Units Subcutaneous TID WC  ? insulin glargine-yfgn  5 Units Subcutaneous QHS  ? levothyroxine  50 mcg Oral QAC breakfast  ? pantoprazole  40 mg Oral BID  ? pentoxifylline  400 mg Oral TID WC  ? sodium chloride flush  3 mL Intravenous Q12H  ? torsemide  150 mg Oral Daily  ? ? have reviewed scheduled and prn medications. ? ?Physical Exam: ?General:  NAD ?Heart: RRR ?Lungs: dec BS at extreme bases ?Abdomen: soft, non tender ?Extremities: compression hose-  has pitting edema in thighs -  foot ulcer as described in other notes  ?Dialysis Access: left upper AVF  ? ? ?02/16/2022,12:27 PM ? LOS: 2 days  ? ?  ? ? ? ? ?

## 2022-02-16 NOTE — Anesthesia Postprocedure Evaluation (Signed)
Anesthesia Post Note ? ?Patient: Amy Moses ? ?Procedure(s) Performed: RIGHT FOOT 5TH RAY AMPUTATION (Right) ? ?  ? ?Patient location during evaluation: PACU ?Anesthesia Type: MAC ?Level of consciousness: awake and alert ?Pain management: pain level controlled ?Vital Signs Assessment: post-procedure vital signs reviewed and stable ?Respiratory status: spontaneous breathing ?Cardiovascular status: stable ?Anesthetic complications: no ? ? ?No notable events documented. ? ?Last Vitals:  ?Vitals:  ? 02/16/22 1244 02/16/22 1425  ?BP: 109/71 95/60  ?Pulse: 71 64  ?Resp: 18 (!) 25  ?Temp: 37 ?C (!) 36.1 ?C  ?SpO2: 100% 96%  ?  ?Last Pain:  ?Vitals:  ? 02/16/22 1425  ?TempSrc:   ?PainSc: 0-No pain  ? ? ?  ?  ?  ?  ?  ?  ? ?Nolon Nations ? ? ? ? ?

## 2022-02-16 NOTE — Transfer of Care (Signed)
Immediate Anesthesia Transfer of Care Note ? ?Patient: Amy Moses ? ?Procedure(s) Performed: RIGHT FOOT 5TH RAY AMPUTATION (Right) ? ?Patient Location: PACU ? ?Anesthesia Type:MAC ? ?Level of Consciousness: awake, alert  and oriented ? ?Airway & Oxygen Therapy: Patient Spontanous Breathing ? ?Post-op Assessment: Report given to RN and Post -op Vital signs reviewed and stable ? ?Post vital signs: Reviewed and stable ? ?Last Vitals:  ?Vitals Value Taken Time  ?BP 95/60 02/16/22 1424  ?Temp    ?Pulse 64 02/16/22 1426  ?Resp 22 02/16/22 1426  ?SpO2 98 % 02/16/22 1426  ?Vitals shown include unvalidated device data. ? ?Last Pain:  ?Vitals:  ? 02/16/22 1244  ?TempSrc: Oral  ?PainSc:   ?   ? ?Patients Stated Pain Goal: 0 (02/15/22 2102) ? ?Complications: No notable events documented. ?

## 2022-02-16 NOTE — Anesthesia Procedure Notes (Signed)
Procedure Name: [place] ?Date/Time: 02/16/2022 1:49 PM ?Performed by: Dorthea Cove, CRNA ?Pre-anesthesia Checklist: Patient identified, Emergency Drugs available, Suction available, Patient being monitored and Timeout performed ?Patient Re-evaluated:Patient Re-evaluated prior to induction ?Oxygen Delivery Method: Simple face mask ?Preoxygenation: Pre-oxygenation with 100% oxygen ?Induction Type: IV induction ?Placement Confirmation: CO2 detector and positive ETCO2 ? ? ? ? ?

## 2022-02-16 NOTE — Progress Notes (Signed)
?PROGRESS NOTE ? ?Amy Moses  OIN:[id number] DOB: 1970-12-25 DOA: 02/14/2022 ?PCP: Emelda Fear, DO  ? ?Brief Narrative: ?Patient is a 51 year old female with history of hypertension, hyperlipidemia, ESRD on dialysis on TTS schedule, diabetes type 2, hypothyroidism, right eye blindness, calciphylaxis of bilateral lower extremities who presented from Dr. Jess Barters clinic with worsening right foot wound.  She was recently hospitalized from 3/6 - 3/8 for ischemic ulcer of the right foot and was discharged on oral antibiotics.  Evaluation at Dr. Jess Barters office showed large abscess and necrotic ulcer on the lateral aspect of the right foot.  X-ray showed osteomyelitis of the base of fifth metatarsal with diffuse soft tissue swelling, concern for infection.  Started on broad-spectrum antibiotics.  Orthopedics following, considering 5 ray amputation tomorrow. ? ?Assessment & Plan: ? ?Principal Problem: ?  Osteomyelitis of foot ([place]) ?Active Problems: ?  Ischemic ulcer of right foot ([place]) ?  End stage renal disease ([place]) ?  Uncontrolled type 2 diabetes mellitus with hyperglycemia, with long-term current use of insulin ([place]) ?  Primary hypothyroidism ?  Hyperlipidemia ?  Normocytic anemia ?  Essential hypertension, benign ?  Calciphylaxis ? ?Osteomyelitis of the right foot:  ?Calciphylaxis-indolent ?Presented from orthopedics office.  X-ray concerning for infection, signs of osteomyelitis.  Started on vancomycin,, metronidazole, Rocephin.  Elevated CRP--Per my personal conversation with Dr. [name] may need antibiotics for several days as was just hospitalized-calciphylaxis is pretty indolent and is difficult to manage so would need to review wound frequently ?Continue pain management with oxycodone 5 every 6 as needed, fentanyl ?Appreciate Dr. [name] input-3/29 ray amputation planned ?Continue aspirin 81 mg ? ?ESRD: Dialyzed on TTS schedule.  Nephrology consulted and following.  ?Continue torsemide 150 daily ? Hemodialysis  access on the left arm ? ?Uncontrolled type 2 diabetes with hyperglycemia with long-term insulin use: A1c of 12.2 as per 1/23.   ?Continuing Semglee 5 units at bedtime, 3 times daily meals insulin NovoLog ?Sugars suboptimally controlled 192 333 increase in a.m. is still elevated to 10 units of Semglee ? ?Hypothyroidism: Continue Synthyroid and get TSH in the outpatient setting ? ?Hyperlipidemia: Continue Lipitor ? ?Normocytic anemia: Currently hemoglobin stable, associated with chronic kidney disease, check iron studies. TSAT low in "21 ? ?Hypertension: Monitor blood pressure --does not appear to be on PTA meds 2/2 hypotensions c iHD?  Other than torsemide ? ?Calciphylaxis: On both lower extremities.  History of prior skin grafting, follows with Dr. [name] ? ?Sacral ulcer: Stage III decubitus not visualized today-Per wound care ? ? ?DVT prophylaxis:heparin injection 5,000 Units Start: 02/14/22 1500 ?SCDs Start: 02/14/22 1446 ? ? ?  Code Status: Full Code ? ?Family Communication: None at bedside ? ?Patient status:Inpatient ? ?Patient is from :Home ? ?Anticipated discharge BS:[facility]GGE ? ?Estimated DC date:after orthopedics clearance ? ? ?Consultants: Orthopedics ? ?Procedures:None yet ? ?Antimicrobials:  ? ? ? ?Subjective: ? ?Doing fair had some questions about when surgery will be ?Pain is moderate ?No cp fever no chills ? ? ?Objective: ?Vitals:  ? 02/15/22 1100 02/15/22 1120 02/15/22 2102 02/16/22 0739  ?BP: 116/67 122/73 109/69 105/66  ?Pulse: 65 67 70   ?Resp:  20 16 15   ?Temp:  98 ?F (36.7 ?C) 97.9 ?F (36.6 ?C) (!) 97.5 ?F (36.4 ?C)  ?TempSrc:  Oral Oral Oral  ?SpO2: 100% 100% 100% 99%  ?Weight:  91.9 kg    ?Height:      ? ? ?Intake/Output Summary (Last 24 hours) at 02/16/2022 1136 ?Last data filed at 02/16/2022 0400 ?Johney Maine  per 24 hour  ?Intake 729.37 ml  ?Output --  ?Net 729.37 ml  ? ? ?Filed Weights  ? 02/15/22 0600 02/15/22 0740 02/15/22 1120  ?Weight: 91.3 kg 93.7 kg 91.9 kg  ? ? ?Examination: ? ?Eomi ncat no  ict ?Cta b no added sound ?S1 S2 no m/r/g ?Abd soft nt nd no rebound no gaurd ?Neuro intact no focal deficit ?Wounds nv today ? ?Data Reviewed: I have personally reviewed following labs and imaging studies ? ?CBC: ?Recent Labs  ?Lab 02/14/22 ?1231 02/15/22 ?0641  ?WBC 8.0 7.1  ?NEUTROABS 5.8  --   ?HGB 11.1* 11.0*  ?HCT 36.4 35.0*  ?MCV 88.3 87.1  ?PLT 240 227  ? ? ?Basic Metabolic Panel: ?Recent Labs  ?Lab 02/14/22 ?1231 02/15/22 ?0641 02/16/22 ?0245  ?NA 135 134* 134*  ?K 3.7 3.8 3.7  ?CL 97* 94* 96*  ?CO2 26 24 24   ?GLUCOSE 229* 351* 180*  ?BUN 47* 56* 32*  ?CREATININE 6.21* 6.84* 5.18*  ?CALCIUM 8.9 8.5* 8.5*  ?PHOS  --  6.9* 4.9*  ? ? ? ? ?No results found for this or any previous visit (from the past 240 hour(s)).  ? ?Radiology Studies: ?DG Ankle Complete Right ? ?Result Date: 02/14/2022 ?CLINICAL DATA:  Right ankle with EXAM: RIGHT ANKLE - COMPLETE 3+ VIEW COMPARISON:  None. FINDINGS: There is diffuse soft tissue swelling. There is a lateral midfoot/hindfoot wound with adjacent soft tissue gas and lucency and irregularity of the base of the fifth metatarsal. Vascular calcifications. Prior ankle fixation. Intact hardware without evidence of loosening. Extensive vascular calcifications. IMPRESSION: Findings suspicious for lateral midfoot/hindfoot wound with soft tissue infection and underlying osteomyelitis at the base of the fifth metatarsal. Diffuse ankle soft tissue swelling with prior ankle fixation. No evidence of loosening or periprosthetic fracture. Electronically Signed   By: Maurine Simmering M.D.   On: 02/14/2022 13:14  ? ?DG Foot Complete Right ? ?Result Date: 02/14/2022 ?CLINICAL DATA:  Right foot infection EXAM: RIGHT FOOT COMPLETE - 3+ VIEW COMPARISON:  None. FINDINGS: There is a soft tissue defect and soft tissue gas along the lateral midfoot with adjacent lucency and irregularity of the base of of the fifth metatarsal. Chronic fracture deformities of the second through fourth metatarsals. Diffuse  osteopenia. Hallux valgus with severe first MTP arthritis. Extensive vascular calcifications. Diffuse soft tissue swelling. Plantar calcaneal spurring. Prior distal tibia and fibular fixation. IMPRESSION: Findings suspicious for lateral midfoot ulcer and soft tissue infection with underlying osteomyelitis at the base of the fifth metatarsal. Electronically Signed   By: Maurine Simmering M.D.   On: 02/14/2022 13:12   ? ?Scheduled Meds: ? [MAR Hold] acidophilus  2 capsule Oral TID  ? [MAR Hold] aspirin EC  81 mg Oral Q breakfast  ? [MAR Hold] atorvastatin  10 mg Oral QHS  ? chlorhexidine  60 mL Topical Once  ? [MAR Hold] Chlorhexidine Gluconate Cloth  6 each Topical Q0600  ? [MAR Hold] ferric citrate  420 mg Oral BID WC  ? [MAR Hold] heparin  5,000 Units Subcutaneous Q8H  ? [MAR Hold] insulin aspart  0-15 Units Subcutaneous TID WC  ? [MAR Hold] insulin glargine-yfgn  5 Units Subcutaneous QHS  ? [MAR Hold] levothyroxine  50 mcg Oral QAC breakfast  ? [MAR Hold] pantoprazole  40 mg Oral BID  ? [MAR Hold] pentoxifylline  400 mg Oral TID WC  ? [MAR Hold] sodium chloride flush  3 mL Intravenous Q12H  ? [MAR Hold] torsemide  150 mg Oral  Daily  ? ?Continuous Infusions: ?  ceFAZolin (ANCEF) IV    ? cefTRIAXone (ROCEPHIN)  IV 2 g (02/15/22 2245)  ? And  ? metronidazole 500 mg (02/16/22 1009)  ? vancomycin Stopped (02/15/22 1202)  ? ? ? LOS: 2 days  ? ?Nita Sells, MD ?[facility] ?P3/29/2023, 11:36 AM   ?

## 2022-02-16 NOTE — Plan of Care (Signed)
?  Problem: Education: ?Goal: Knowledge of General Education information will improve ?Description: Including pain rating scale, medication(s)/side effects and non-pharmacologic comfort measures ?Outcome: Progressing ?  ?Problem: Health Behavior/Discharge Planning: ?Goal: Ability to manage health-related needs will improve ?Outcome: Progressing ?  ?Problem: Clinical Measurements: ?Goal: Ability to maintain clinical measurements within normal limits will improve ?Outcome: Progressing ?Goal: Will remain free from infection ?Outcome: Not Progressing ?Goal: Diagnostic test results will improve ?Outcome: Not Progressing ?Goal: Respiratory complications will improve ?Outcome: Not Progressing ?Goal: Cardiovascular complication will be avoided ?Outcome: Not Progressing ?  ?

## 2022-02-17 ENCOUNTER — Telehealth: Payer: Self-pay

## 2022-02-17 DIAGNOSIS — M86271 Subacute osteomyelitis, right ankle and foot: Secondary | ICD-10-CM | POA: Diagnosis not present

## 2022-02-17 LAB — RENAL FUNCTION PANEL
Albumin: 2.4 g/dL — ABNORMAL LOW (ref 3.5–5.0)
Anion gap: 13 (ref 5–15)
BUN: 42 mg/dL — ABNORMAL HIGH (ref 6–20)
CO2: 24 mmol/L (ref 22–32)
Calcium: 8.6 mg/dL — ABNORMAL LOW (ref 8.9–10.3)
Chloride: 98 mmol/L (ref 98–111)
Creatinine, Ser: 5.84 mg/dL — ABNORMAL HIGH (ref 0.44–1.00)
GFR, Estimated: 8 mL/min — ABNORMAL LOW (ref >60)
Glucose, Bld: 119 mg/dL — ABNORMAL HIGH (ref 70–99)
Phosphorus: 6.3 mg/dL — ABNORMAL HIGH (ref 2.5–4.6)
Potassium: 4.1 mmol/L (ref 3.5–5.1)
Sodium: 135 mmol/L (ref 135–145)

## 2022-02-17 LAB — GLUCOSE, CAPILLARY
Glucose-Capillary: 180 mg/dL — ABNORMAL HIGH (ref 70–99)
Glucose-Capillary: 184 mg/dL — ABNORMAL HIGH (ref 70–99)
Glucose-Capillary: 218 mg/dL — ABNORMAL HIGH (ref 70–99)
Glucose-Capillary: 183 mg/dL — ABNORMAL HIGH (ref 70–99)

## 2022-02-17 LAB — CBC WITH DIFFERENTIAL/PLATELET
Abs Immature Granulocytes: 0.03 10*3/uL (ref 0.00–0.07)
Basophils Absolute: 0.1 10*3/uL (ref 0.0–0.1)
Basophils Relative: 1 %
Eosinophils Absolute: 0.5 10*3/uL (ref 0.0–0.5)
Eosinophils Relative: 6 %
HCT: 32.3 % — ABNORMAL LOW (ref 36.0–46.0)
Hemoglobin: 10.1 g/dL — ABNORMAL LOW (ref 12.0–15.0)
Immature Granulocytes: 0 %
Lymphocytes Relative: 15 %
Lymphs Abs: 1.2 10*3/uL (ref 0.7–4.0)
MCH: 27.4 pg (ref 26.0–34.0)
MCHC: 31.3 g/dL (ref 30.0–36.0)
MCV: 87.8 fL (ref 80.0–100.0)
Monocytes Absolute: 0.7 10*3/uL (ref 0.1–1.0)
Monocytes Relative: 9 %
Neutro Abs: 5.5 10*3/uL (ref 1.7–7.7)
Neutrophils Relative %: 69 %
Platelets: 209 10*3/uL (ref 150–400)
RBC: 3.68 MIL/uL — ABNORMAL LOW (ref 3.87–5.11)
RDW: 14.5 % (ref 11.5–15.5)
WBC: 7.9 10*3/uL (ref 4.0–10.5)
nRBC: 0 % (ref 0.0–0.2)

## 2022-02-17 LAB — IRON AND TIBC
Iron: 97 ug/dL (ref 28–170)
Saturation Ratios: 57 % — ABNORMAL HIGH (ref 10.4–31.8)
TIBC: 171 ug/dL — ABNORMAL LOW (ref 250–450)
UIBC: 74 ug/dL

## 2022-02-17 LAB — FERRITIN: Ferritin: 414 ng/mL — ABNORMAL HIGH (ref 11–307)

## 2022-02-17 MED ORDER — ALBUMIN HUMAN 25 % IV SOLN
INTRAVENOUS | Status: AC
  Administered 2022-02-17: 25 g via INTRAVENOUS
  Filled 2022-02-17: qty 100

## 2022-02-17 MED ORDER — DOXYCYCLINE HYCLATE 100 MG PO TABS
100.0000 mg | ORAL_TABLET | Freq: Two times a day (BID) | ORAL | Status: DC
  Administered 2022-02-17 – 2022-02-18 (×4): 100 mg via ORAL
  Filled 2022-02-17 (×5): qty 1

## 2022-02-17 MED ORDER — INSULIN GLARGINE-YFGN 100 UNIT/ML ~~LOC~~ SOLN
8.0000 [IU] | Freq: Every day | SUBCUTANEOUS | Status: DC
  Administered 2022-02-17 – 2022-02-18 (×2): 8 [IU] via SUBCUTANEOUS
  Filled 2022-02-17 (×3): qty 0.08

## 2022-02-17 MED ORDER — ALBUMIN HUMAN 25 % IV SOLN
25.0000 g | INTRAVENOUS | Status: DC | PRN
  Administered 2022-02-19: 25 g via INTRAVENOUS
  Filled 2022-02-17: qty 100

## 2022-02-17 NOTE — Progress Notes (Signed)
OT Cancellation Note ? ?Patient Details ?Name: Loucille Takach ?MRN: [id number] ?DOB: Jun 22, 1971 ? ? ?Cancelled Treatment:    Reason Eval/Treat Not Completed: Patient at procedure or test/ unavailable (HD) ? ?Malka So ?02/17/2022, 9:52 AM ?Nestor Lewandowsky, OTR/L ?Acute Rehabilitation Services ?Pager: [phone] ?Office: [phone]  ?

## 2022-02-17 NOTE — Procedures (Signed)
Patient was seen on dialysis and the procedure was supervised.  BFR 400  Via AVF BP is  110/61. ? ? Patient appears to be tolerating treatment well ? ?Amy Moses ?02/17/2022 ? ?

## 2022-02-17 NOTE — Evaluation (Signed)
Physical Therapy Evaluation & Discharge ?Patient Details ?Name: Amy Moses ?MRN: [id number] ?DOB: 10-20-1971 ?Today's Date: 02/17/2022 ? ?History of Present Illness ? Pt is a 51 y.o. female admitted 02/14/22 with R foot osteomyelitis. S/p R foot 5th ray amputation and wound vac placement 3/29. Pt also with sacral ulcer. PMH includes R eye blindness, DM, glaucoma, HTN, ESRD (HD TTS), metabolic bone disease. ?  ?Clinical Impression ? Patient evaluated by Physical Therapy with no further acute PT needs identified. Pt able to demonstrates lateral scoot transfer while maintaining RLE NWB precautions; pt also with preference to maintain LLE NWB due to apparent chronic patella fx. Pt requesting new wheelchair and pressure-relief cushion; pt reports "Dr. Jess Barters office is working on this." Pt declines need for further acute PT or follow-up PT services. Acute PT is signing off. Thank you for this referral.    ? ?Recommendations for follow up therapy are one component of a multi-disciplinary discharge planning process, led by the attending physician.  Recommendations may be updated based on patient status, additional functional criteria and insurance authorization. ? ?Follow Up Recommendations No PT follow up ? ?  ?Assistance Recommended at Discharge Set up Supervision/Assistance  ?Patient can return home with the following ? Assistance with cooking/housework;Assist for transportation ? ?  ?Equipment Recommendations Pressure-relieving wheelchair cushion  ?Recommendations for Other Services ?    ?  ?Functional Status Assessment Patient has not had a recent decline in their functional status  ? ?  ?Precautions / Restrictions Precautions ?Precautions: Fall;Other (comment) ?Precaution Comments: buttocks wounds, chronic L patella fx, R foot wound vac ?Restrictions ?Weight Bearing Restrictions: Yes ?RUE Weight Bearing: Non weight bearing ?Other Position/Activity Restrictions: pt has been NWB on L since patella fx (pt unable to  state how long this has been, but has no interested in standing on LLE until "it is fixed")  ? ?  ? ?Mobility ? Bed Mobility ?Overal bed mobility: Modified Independent ?Bed Mobility: Supine to Sit (HOB elevated) ?  ?  ?  ?  ?  ?  ?  ? ?Transfers ?Overall transfer level: Modified independent ?Equipment used: None ?Transfers: Bed to chair/wheelchair/BSC ?  ?  ?  ?  ?  ?  ?General transfer comment: pt declined OOB mobility; mod indep with lateral scoot transfer at EOB; good ability to maintain RLE NWB, pt prefers to also keep LLE NWB ?  ? ?Ambulation/Gait ?  ?  ?  ?  ?  ?  ?  ?  ? ?Stairs ?  ?  ?  ?  ?  ? ?Wheelchair Mobility ?  ? ?Modified Rankin (Stroke Patients Only) ?  ? ?  ? ?Balance Overall balance assessment: Needs assistance ?  ?Sitting balance-Leahy Scale: Good ?  ?  ?  ?  ?  ?  ?  ?  ?  ?  ?  ?  ?  ?  ?  ?  ?   ? ? ? ?Pertinent Vitals/Pain Pain Assessment ?Pain Assessment: No/denies pain  ? ? ?Home Living Family/patient expects to be discharged to:: Private residence ?Living Arrangements: Children ?Available Help at Discharge: Family;Available PRN/intermittently ?Type of Home: House ?Home Access: Ramped entrance ?  ?  ?  ?Home Layout: One level ?Home Equipment: Wheelchair - manual;Shower seat;Other (comment) (drop-arm BSC, lift chair) ?Additional Comments: At baseline, pt. completes slide transfer to w/c and independently propels w/c around home.  Does not amb due to patella fx she is waiting to get repaired.  States she cannot have it repaired  until after her LE wounds heal.  ?  ?Prior Function Prior Level of Function : Independent/Modified Independent ?  ?  ?  ?  ?  ?  ?Mobility Comments: laterally transfers and propels w/c mod indep; intermittent use of slide board when surface heights varies; will stand pivot with assist to get into truck ?ADLs Comments: Mod I for ADLs from wheelchair level; sponge bathes, transfers to toilet without assist via scoot transer. Able to perform meal prep, access items in  kitchen from w/c level ?  ? ? ?Hand Dominance  ? Dominant Hand: Right ? ?  ?Extremity/Trunk Assessment  ? Upper Extremity Assessment ?Upper Extremity Assessment: RUE deficits/detail ?RUE Deficits / Details: R hand muscle atrophy ?RUE Coordination: decreased fine motor ?  ? ?Lower Extremity Assessment ?Lower Extremity Assessment: RLE deficits/detail;LLE deficits/detail ?RLE Deficits / Details: s/p R 5th ray amputation; hip and knee functionally >/ 3/5 strength, not formally tested ?LLE Deficits / Details: reports h/o chronic L patella fx; hip and knee strength functionally at least 3/5, not formally tested ?  ? ?Cervical / Trunk Assessment ?Cervical / Trunk Assessment: Normal  ?Communication  ? Communication: No difficulties  ?Cognition Arousal/Alertness: Awake/alert ?Behavior During Therapy: [facility] for tasks assessed/performed ?Overall Cognitive Status: Within Functional Limits for tasks assessed ?  ?  ?  ?  ?  ?  ?  ?  ?  ?  ?  ?  ?  ?  ?  ?  ?  ?  ?  ? ?  ?General Comments General comments (skin integrity, edema, etc.): pt declined need for HHPT services or continued acute PT services. discussion regarding current wheelchair and poor quality of wheelchair cushion, especially given pt's buttocks wounds; pt reports Dr. Jess Barters office knows and is "working on it" ? ?  ?Exercises    ? ?Assessment/Plan  ?  ?PT Assessment Patient does not need any further PT services  ?PT Problem List   ? ?   ?  ?PT Treatment Interventions     ? ?PT Goals (Current goals can be found in the Care Plan section)  ?Acute Rehab PT Goals ?PT Goal Formulation: All assessment and education complete, DC therapy ? ?  ?Frequency   ?  ? ? ?Co-evaluation   ?  ?  ?  ?  ? ? ?  ?AM-PAC PT "6 Clicks" Mobility  ?Outcome Measure Help needed turning from your back to your side while in a flat bed without using bedrails?: None ?Help needed moving from lying on your back to sitting on the side of a flat bed without using bedrails?: None ?Help needed moving to and  from a bed to a chair (including a wheelchair)?: A Little ?Help needed standing up from a chair using your arms (e.g., wheelchair or bedside chair)?: Total ?Help needed to walk in hospital room?: Total ?Help needed climbing 3-5 steps with a railing? : Total ?6 Click Score: 14 ? ?  ?End of Session   ?Activity Tolerance: Patient tolerated treatment well;Patient limited by fatigue ?Patient left: in bed;with call bell/phone within reach;with nursing/sitter in room ?Nurse Communication: Mobility status ?PT Visit Diagnosis: Other abnormalities of gait and mobility (R26.89) ?  ? ?Time: 1420-1440 ?PT Time Calculation (min) (ACUTE ONLY): 20 min ? ? ?Charges:   PT Evaluation ?$PT Eval Moderate Complexity: 1 Mod ?  ?  ?   ?Mabeline Caras, PT, DPT ?Acute Rehabilitation Services  ?Pager [phone] ?Office [phone] ? ?Derry Lory ?02/17/2022, 4:56 PM ? ?

## 2022-02-17 NOTE — Progress Notes (Signed)
Patient ID: Amy Moses, female   DOB: Aug 31, 1971, 51 y.o.   MRN: [id number] ?Patient is postoperative day 1 excision of the fifth ray with extensive soft tissue excision secondary to gangrenous changes.  Tissue margins were clear and Kerecis powder and skin graft was applied.  The wound VAC has 250 cc of drainage.  Anticipate patient can discharge once the drainage is less than 50 cc a day.  I will write orders to have the wrap changed on the left leg and will write orders for wound ostomy continence nursing to evaluate her sacral ulcer. ?

## 2022-02-17 NOTE — Progress Notes (Signed)
PT Cancellation Note ? ?Patient Details ?Name: Amy Moses ?MRN: [id number] ?DOB: 1971-09-27 ? ? ?Cancelled Treatment:    Reason Eval/Treat Not Completed: Patient at procedure or test/unavailable (HD). Will follow-up for PT Evaluation as schedule permits. ? ?Mabeline Caras, PT, DPT ?Acute Rehabilitation Services  ?Pager [phone] ?Office [phone] ? ?Derry Lory ?02/17/2022, 8:10 AM ? ? ?

## 2022-02-17 NOTE — Telephone Encounter (Signed)
Patient called from the hospital on her cell phone regarding a new wheelchair. She stated that she was following up on it. Amy Moses got on the phone she was the patient OT at the time in the hospital she stated that we can just give patient a call back on her phone and she can speak with [name].  ?

## 2022-02-17 NOTE — Progress Notes (Signed)
?PROGRESS NOTE ? ?Amy Moses  GYK:[id number] DOB: March 09, 1971 DOA: 02/14/2022 ?PCP: Emelda Fear, DO  ? ?Brief Narrative: ? ?48-blk fem ?hypertension, hyperlipidemia, ESRD on dialysis on TTS schedule,  ?diabetes type 2 ?hypothyroidism, right eye blindness [not dm related] ?calciphylaxis of bilateral lower extremities who presented 3/27 from Dr. Jess Barters clinic with worsening right foot wound.   ? hospitalized from 3/6 - 3/8 for ischemic ulcer of the right foot and was discharged on oral antibiotics. ? ?Evaluation at Dr. Jess Barters office showed large abscess and necrotic ulcer on the lateral aspect of the right foot.  ? X-ray showed osteomyelitis of the base of fifth metatarsal with diffuse soft tissue swelling, concern for infection.   ?Started on broad-spectrum antibiotics.  Orthopedics saw patient and took to OR 3/29  ? ?antibiotics have been narrowed-patient Stabilizing ? ?Assessment & Plan: ? ?Principal Problem: ?  Osteomyelitis of foot ([place]) ?Active Problems: ?  Ischemic ulcer of right foot ([place]) ?  End stage renal disease ([place]) ?  Uncontrolled type 2 diabetes mellitus with hyperglycemia, with long-term current use of insulin ([place]) ?  Primary hypothyroidism ?  Hyperlipidemia ?  Normocytic anemia ?  Essential hypertension, benign ?  Calciphylaxis ?  Cutaneous abscess of right foot ? ?Osteomyelitis of the right foot:  ?Calciphylaxis-indolent-status post ray amputation 3/29 Dr. [name] ?-vancomycin metronidazole, Rocephin-->doxycycline 100 bid ?-Elevated CRP--Per my personal conversation with Dr. [name] may need antibiotics for several days as was just hospitalized ?-Per Dr. Duda-calciphylaxis is pretty indolent and is difficult to manage so would need to review wound frequently ?-Continue pain management with oxycodone 5 every 6 as needed, morphine 0.5-1 q 2 prn ?-Continue aspirin 81 mg ?-Continue Trental 400 3 times daily for rest pain ? ?ESRD:  ?-Dialyzed on TTS schedule.  Nephrology consulted and following.   ?-Continue torsemide 150 daily for now ?-Hemodialysis access on the left arm ? ?Uncontrolled type 2 diabetes with hyperglycemia with long-term insulin use: A1c of 12.2 as per 1/23.   ?Increased long-acting insulin to 8 units at bedtime, 3 times daily meals insulin NovoLog ?CBG 184-218  ? ?Hypothyroidism: Continue Synthyroid and get TSH in the outpatient setting ? ?Hyperlipidemia: Continue Lipitor 10 ? ?Normocytic anemia: Currently hemoglobin stable, associated with chronic kidney disease ?Iron reasonable, TSAT 57 ?Continue Auryxia per renal ? ?Hypertension: Monitor blood pressure --does not appear to be on PTA meds 2/2 hypotensions c iHD?  Other than torsemide ? ?Calciphylaxis: On both lower extremities.  History of prior skin grafting, follows with Dr. [name] ? ?Sacral ulcer: Stage III decubitus not visualized today-Per wound care ? ? ?DVT prophylaxis:SCD's Start: 02/16/22 1532 ?heparin injection 5,000 Units Start: 02/14/22 1500 ?SCDs Start: 02/14/22 1446 ? ? ?  Code Status: Full Code ? ?Family Communication: None at bedside ?Patient status: Inpatient ?Patient is from : Home ?Anticipated discharge to: Home ?Estimated DC date:a fter orthopedics clearance ? ? ?Consultants: Orthopedics ? ?Procedures:None yet ? ?Antimicrobials:  ? ? ? ?Subjective: ? ?Overall doing fair seen on HD unit ?Pain is controlled ?Has a good understanding of the procedure etc. wound to be reviewed after VAC has been taken off by Dr. [name] in the next several days ? ? ?Objective: ?Vitals:  ? 02/17/22 1130 02/17/22 1200 02/17/22 1226 02/17/22 1300  ?BP: (!) 112/55 105/61 111/63 103/65  ?Pulse: 68 66 69 73  ?Resp:   (!) 22 19  ?Temp:   97.8 ?F (36.6 ?C) 98 ?F (36.7 ?C)  ?TempSrc:   Temporal Oral  ?SpO2:   99% 99%  ?  Weight:   97.5 kg   ?Height:      ? ? ?Intake/Output Summary (Last 24 hours) at 02/17/2022 1634 ?Last data filed at 02/17/2022 1226 ?Gross per 24 hour  ?Intake 1080.49 ml  ?Output 3900 ml  ?Net -2819.51 ml  ? ? ?Filed Weights  ? 02/16/22  1244 02/17/22 0803 02/17/22 1226  ?Weight: 93 kg 101 kg 97.5 kg  ? ? ?Examination: ? ?Eomi ncat no ict, postoperative changes to right eye- ?asthenic ?Diffuse rash over chest seems chronic ?Cta b no added sound ?S1 S2 no m/r/g ?Abd soft nt nd no rebound no gaurd ?Neuro intact no focal deficit ?Lower extremity pitting edema ? ? ?Data Reviewed: I have personally reviewed following labs and imaging studies ? ?CBC: ?Recent Labs  ?Lab 02/14/22 ?1231 02/15/22 ?0641 02/17/22 ?0202  ?WBC 8.0 7.1 7.9  ?NEUTROABS 5.8  --  5.5  ?HGB 11.1* 11.0* 10.1*  ?HCT 36.4 35.0* 32.3*  ?MCV 88.3 87.1 87.8  ?PLT 240 227 209  ? ? ?Basic Metabolic Panel: ?Recent Labs  ?Lab 02/14/22 ?1231 02/15/22 ?9381 02/16/22 ?0245 02/17/22 ?0202  ?NA 135 134* 134* 135  ?K 3.7 3.8 3.7 4.1  ?CL 97* 94* 96* 98  ?CO2 26 24 24 24   ?GLUCOSE 229* 351* 180* 119*  ?BUN 47* 56* 32* 42*  ?CREATININE 6.21* 6.84* 5.18* 5.84*  ?CALCIUM 8.9 8.5* 8.5* 8.6*  ?PHOS  --  6.9* 4.9* 6.3*  ? ? ? ? ?Recent Results (from the past 240 hour(s))  ?Surgical pcr screen     Status: None  ? Collection Time: 02/16/22 10:20 AM  ? Specimen: Nasal Mucosa; Nasal Swab  ?Result Value Ref Range Status  ? MRSA, PCR NEGATIVE NEGATIVE Final  ? Staphylococcus aureus NEGATIVE NEGATIVE Final  ?  Comment: (NOTE) ?The Xpert SA Assay (FDA approved for NASAL specimens in patients 66 ?years of age and older), is one component of a comprehensive ?surveillance program. It is not intended to diagnose infection nor to ?guide or monitor treatment. ?Performed at [facility], [place] [address] [place], [place] ?[address] ?  ?  ? ?Radiology Studies: ?No results found. ? ?Scheduled Meds: ? acidophilus  2 capsule Oral TID  ? vitamin C  1,000 mg Oral Daily  ? aspirin EC  81 mg Oral Q breakfast  ? atorvastatin  10 mg Oral QHS  ? Chlorhexidine Gluconate Cloth  6 each Topical Q0600  ? docusate sodium  100 mg Oral Daily  ? doxycycline  100 mg Oral Q12H  ? ferric citrate  420 mg Oral BID WC  ? heparin  5,000 Units  Subcutaneous Q8H  ? insulin aspart  0-15 Units Subcutaneous TID WC  ? insulin glargine-yfgn  8 Units Subcutaneous QHS  ? levothyroxine  50 mcg Oral QAC breakfast  ? nutrition supplement (JUVEN)  1 packet Oral BID BM  ? pantoprazole  40 mg Oral BID  ? pentoxifylline  400 mg Oral TID WC  ? sodium chloride flush  3 mL Intravenous Q12H  ? torsemide  150 mg Oral Daily  ? zinc sulfate  220 mg Oral Daily  ? ?Continuous Infusions: ? sodium chloride Stopped (02/16/22 2008)  ? albumin human 25 g (02/17/22 0842)  ? magnesium sulfate bolus IVPB    ? ? ? LOS: 3 days  ? ?Verneita Griffes, MD ?[facility] ?6:12 PM ? ?

## 2022-02-17 NOTE — Progress Notes (Signed)
Subjective:  Seen in dialysis - no c/o's -  pain controlled  ?Objective ?Vital signs in last 24 hours: ?Vitals:  ? 02/17/22 0812 02/17/22 0830 02/17/22 0900 02/17/22 0930  ?BP: 114/69 112/65 (!) 104/58 110/61  ?Pulse: 75 65 60 65  ?Resp: 14 20 12    ?Temp:      ?TempSrc:      ?SpO2:      ?Weight:      ?Height:      ? ?Weight change: -0.713 kg ? ?Intake/Output Summary (Last 24 hours) at 02/17/2022 0938 ?Last data filed at 02/17/2022 0524 ?Gross per 24 hour  ?Intake 1520.49 ml  ?Output 420 ml  ?Net 1100.49 ml  ? ? ?Dialyzes at [place]  says 5 hours ?  Been on HD for a year and a half  EDW 95.5. ?HD Bath 2/2.5, Dialyzer unknown, Heparin yes. Access left upper AVF. ? ? ?Assessment/Plan: 51 year old BF with DM and ESRD-  has had complications of foot ulcer  ?1 Foot ulcer-  cultures and started on rocephin /flagyl and vanc.  Dr. [name] involved-  planning on limited amputation on 3/29-  he is following-   says can be discharged when wound vac drainage gets to a certain level  ?2 ESRD: normally TTS via AVF-  will plan on HD today on schedule ?3 Hypertension/volume: BP is fine/low on no BP meds-  has pitting edema - alb 2.6-  will try to challenge as able but difficult due to low BPS.  Is on torsemide as OP?  Must make some urine ?4. Anemia of ESRD: hgb greater than 11-  now 10 after surgery  ?5. Metabolic Bone Disease: continuing auryxia-  does not seem to be on vitamin D as OP ? ? ?Louis Meckel  ? ? ?Labs: ?Basic Metabolic Panel: ?Recent Labs  ?Lab 02/15/22 ?2683 02/16/22 ?0245 02/17/22 ?0202  ?NA 134* 134* 135  ?K 3.8 3.7 4.1  ?CL 94* 96* 98  ?CO2 24 24 24   ?GLUCOSE 351* 180* 119*  ?BUN 56* 32* 42*  ?CREATININE 6.84* 5.18* 5.84*  ?CALCIUM 8.5* 8.5* 8.6*  ?PHOS 6.9* 4.9* 6.3*  ? ?Liver Function Tests: ?Recent Labs  ?Lab 02/14/22 ?1231 02/15/22 ?4196 02/16/22 ?0245 02/17/22 ?0202  ?AST 12*  --   --   --   ?ALT 11  --   --   --   ?ALKPHOS 110  --   --   --   ?BILITOT 0.6  --   --   --   ?PROT 6.5  --    --   --   ?ALBUMIN 2.9* 2.6* 2.5* 2.4*  ? ?No results for input(s): LIPASE, AMYLASE in the last 168 hours. ?No results for input(s): AMMONIA in the last 168 hours. ?CBC: ?Recent Labs  ?Lab 02/14/22 ?1231 02/15/22 ?0641 02/17/22 ?0202  ?WBC 8.0 7.1 7.9  ?NEUTROABS 5.8  --  5.5  ?HGB 11.1* 11.0* 10.1*  ?HCT 36.4 35.0* 32.3*  ?MCV 88.3 87.1 87.8  ?PLT 240 227 209  ? ?Cardiac Enzymes: ?No results for input(s): CKTOTAL, CKMB, CKMBINDEX, TROPONINI in the last 168 hours. ?CBG: ?Recent Labs  ?Lab 02/16/22 ?1248 02/16/22 ?1427 02/16/22 ?1645 02/16/22 ?1946 02/17/22 ?2229  ?GLUCAP 190* 149* 153* 171* 180*  ? ? ?Iron Studies:  ?Recent Labs  ?  02/17/22 ?0202  ?IRON 97  ?TIBC 171*  ?FERRITIN 414*  ? ?Studies/Results: ?No results found. ?Medications: ?Infusions: ? sodium chloride Stopped (02/16/22 2008)  ? sodium chloride 10 mL/hr  at 02/17/22 0524  ? albumin human 25 g (02/17/22 0842)  ? cefTRIAXone (ROCEPHIN)  IV 2 g (02/16/22 2143)  ? And  ? metronidazole Stopped (02/16/22 2118)  ? magnesium sulfate bolus IVPB    ? vancomycin Stopped (02/15/22 1202)  ? ? ?Scheduled Medications: ? acidophilus  2 capsule Oral TID  ? vitamin C  1,000 mg Oral Daily  ? aspirin EC  81 mg Oral Q breakfast  ? atorvastatin  10 mg Oral QHS  ? Chlorhexidine Gluconate Cloth  6 each Topical Q0600  ? docusate sodium  100 mg Oral Daily  ? ferric citrate  420 mg Oral BID WC  ? heparin  5,000 Units Subcutaneous Q8H  ? insulin aspart  0-15 Units Subcutaneous TID WC  ? insulin glargine-yfgn  5 Units Subcutaneous QHS  ? levothyroxine  50 mcg Oral QAC breakfast  ? nutrition supplement (JUVEN)  1 packet Oral BID BM  ? pantoprazole  40 mg Oral BID  ? pentoxifylline  400 mg Oral TID WC  ? sodium chloride flush  3 mL Intravenous Q12H  ? torsemide  150 mg Oral Daily  ? zinc sulfate  220 mg Oral Daily  ? ? have reviewed scheduled and prn medications. ? ?Physical Exam: ?General:  NAD ?Heart: RRR ?Lungs: dec BS at extreme bases ?Abdomen: soft, non tender ?Extremities:  compression hose-  has pitting edema in thighs -  foot now post op  ?Dialysis Access: left upper AVF  ? ? ?02/17/2022,9:38 AM ? LOS: 3 days  ? ?  ? ? ? ? ?

## 2022-02-17 NOTE — Evaluation (Addendum)
Occupational Therapy Evaluation and Discharge ?Patient Details ?Name: Amy Moses ?MRN: [id number] ?DOB: 1971-02-15 ?Today's Date: 02/17/2022 ? ? ?History of Present Illness Pt is a 51 y.o. female admitted 02/14/22 with R foot osteomyelitis. S/p R foot 5th ray amputation and wound vac placement 3/29. Pt also with sacral ulcer. PMH includes R eye blindness, DM, glaucoma, HTN, ESRD (HD TTS), metabolic bone disease.  ? ?Clinical Impression ?  ?Pt performs lateral scoot transfers and propels her w/c independently at home. She functions modified independently in ADLs and can prepare meals from w/c level. Pt declined OOB this visit, but recliner with pressure relief cushion set up and transfer board provided for pt to borrow so nursing may get pt OOB. She demonstrated ability to laterally scoot at bed level without physical assistance. Pt is essentially at her baseline and declines further acute OT. All DME needs are met with Dr. Jess Barters office working on a pressure relief w/c cushion and new manual w/c. Signing off.  ?   ? ?Recommendations for follow up therapy are one component of a multi-disciplinary discharge planning process, led by the attending physician.  Recommendations may be updated based on patient status, additional functional criteria and insurance authorization.  ? ?Follow Up Recommendations ? No OT follow up  ?  ?Assistance Recommended at Discharge Set up Supervision/Assistance  ?Patient can return home with the following Assistance with cooking/housework;Assist for transportation ? ?  ?Functional Status Assessment ? Patient has not had a recent decline in their functional status  ?Equipment Recommendations ?  (Autumn at Dr Jess Barters office is working on [employer] and new w/c.)  ?  ?Recommendations for Other Services   ? ? ?  ?Precautions / Restrictions Precautions ?Precautions: Fall;Other (comment) ?Precaution Comments: buttocks wounds, L patella fx, R foot wound vac ?Restrictions ?Weight Bearing  Restrictions: Yes ?RUE Weight Bearing: Non weight bearing ?Other Position/Activity Restrictions: pt has been NWB on L since patella fx  ? ?  ? ?Mobility Bed Mobility ?Overal bed mobility: Needs Assistance ?Bed Mobility: Supine to Sit, Sit to Supine ?  ?  ?Supine to sit: Modified independent (Device/Increase time), HOB elevated ?Sit to supine: Min assist ?  ?General bed mobility comments: min assist for R LE back into bed ?  ? ?Transfers ?Overall transfer level: Needs assistance ?  ?  ?  ?  ?  ?  ?  ?  ?General transfer comment: lateral scoot along EOB modified independently ?  ? ?  ?Balance Overall balance assessment: Needs assistance ?  ?Sitting balance-Leahy Scale: Good ?  ?  ?  ?  ?  ?  ?  ?  ?  ?  ?  ?  ?  ?  ?  ?  ?   ? ?ADL either performed or assessed with clinical judgement  ? ?ADL Overall ADL's : At baseline ?  ?  ?  ?  ?  ?  ?  ?  ?  ?  ?  ?  ?  ?  ?  ?  ?  ?  ?  ?General ADL Comments: Phone call placed to Autumn at Dr Jess Barters office to follow up on w/c and w/c cushion, Autumn to call pt back.  ? ? ? ?Vision Patient Visual Report: No change from baseline ?Additional Comments: blind in R eye  ?   ?Perception   ?  ?Praxis   ?  ? ?Pertinent Vitals/Pain Pain Assessment ?Pain Assessment: No/denies pain  ? ? ? ?Hand Dominance Right ?  ?  Extremity/Trunk Assessment Upper Extremity Assessment ?Upper Extremity Assessment: RUE deficits/detail ?RUE Deficits / Details: R hand muscle atrophy ?RUE Coordination: decreased fine motor ?  ?Lower Extremity Assessment ?Lower Extremity Assessment: Defer to PT evaluation ?  ?Cervical / Trunk Assessment ?Cervical / Trunk Assessment: Normal ?  ?Communication Communication ?Communication: No difficulties ?  ?Cognition Arousal/Alertness: Awake/alert ?Behavior During Therapy: [name] for tasks assessed/performed ?Overall Cognitive Status: Within Functional Limits for tasks assessed ?  ?  ?  ?  ?  ?  ?  ?  ?  ?  ?  ?  ?  ?  ?  ?  ?  ?  ?  ?General Comments    ? ?  ?Exercises   ?  ?Shoulder  Instructions    ? ? ?Home Living Family/patient expects to be discharged to:: Private residence ?Living Arrangements: Children ?Available Help at Discharge: Family;Available PRN/intermittently ?Type of Home: House ?Home Access: Ramped entrance ?  ?  ?Home Layout: One level ?  ?  ?  ?  ?Bathroom Toilet: Standard ?  ?  ?Home Equipment: Wheelchair - manual;Shower seat;Other (comment) (drop arm commode, lift chair) ?  ?Additional Comments: At baseline, pt. completes slide transfer to w/c and independently propels w/c around home.  Does not amb due to patella fx she is waiting to get repaired.  States she cannot have it repaired until after her LE wounds heal. ?  ? ?  ?Prior Functioning/Environment Prior Level of Function : Independent/Modified Independent ?  ?  ?  ?  ?  ?  ?Mobility Comments: laterally transfers and propels w/c independently ?ADLs Comments: Mod I for ADLs from wheelchair level; sponge bathes, transfers to toilet without assist via scoot transer. Able to perform meal prep, access items in kitchen from w/c level ?  ? ?  ?  ?OT Problem List:   ?  ?   ?OT Treatment/Interventions:    ?  ?OT Goals(Current goals can be found in the care plan section) Acute Rehab OT Goals ?OT Goal Formulation: All assessment and education complete, DC therapy  ?OT Frequency:   ?  ? ?Co-evaluation   ?  ?  ?  ?  ? ?  ?AM-PAC OT "6 Clicks" Daily Activity     ?Outcome Measure Help from another person eating meals?: None ?Help from another person taking care of personal grooming?: None ?Help from another person toileting, which includes using toliet, bedpan, or urinal?: None ?Help from another person bathing (including washing, rinsing, drying)?: None ?Help from another person to put on and taking off regular upper body clothing?: None ?Help from another person to put on and taking off regular lower body clothing?: None ?6 Click Score: 24 ?  ?End of Session   ? ?Activity Tolerance: Patient tolerated treatment well ?Patient left: in  bed;with call bell/phone within reach;with nursing/sitter in room ? ?OT Visit Diagnosis: Muscle weakness (generalized) (M62.81)  ?              ?Time: 5732-2025 ?OT Time Calculation (min): 39 min ?Charges:  OT General Charges ?$OT Visit: 1 Visit ?OT Evaluation ?$OT Eval Moderate Complexity: 1 Mod ? ?Nestor Lewandowsky, OTR/L ?Acute Rehabilitation Services ?Pager: [phone] ?Office: [phone]  ? ?Malka So ?02/17/2022, 3:32 PM ?

## 2022-02-17 NOTE — Plan of Care (Signed)

## 2022-02-17 NOTE — Plan of Care (Signed)
?  Problem: Activity: ?Goal: Risk for activity intolerance will decrease ?02/17/2022 2044 by Rosalyn Gess, RN ?Outcome: Progressing ?02/17/2022 2043 by Rosalyn Gess, RN ?Outcome: Progressing ?  ?Problem: Nutrition: ?Goal: Adequate nutrition will be maintained ?02/17/2022 2044 by Rosalyn Gess, RN ?Outcome: Progressing ?02/17/2022 2043 by Rosalyn Gess, RN ?Outcome: Progressing ?  ?Problem: Coping: ?Goal: Level of anxiety will decrease ?02/17/2022 2044 by Rosalyn Gess, RN ?Outcome: Progressing ?02/17/2022 2043 by Rosalyn Gess, RN ?Outcome: Progressing ?  ?

## 2022-02-18 ENCOUNTER — Encounter (HOSPITAL_COMMUNITY): Payer: Self-pay | Admitting: Orthopedic Surgery

## 2022-02-18 DIAGNOSIS — M86271 Subacute osteomyelitis, right ankle and foot: Secondary | ICD-10-CM | POA: Diagnosis not present

## 2022-02-18 LAB — COMPREHENSIVE METABOLIC PANEL
ALT: 8 U/L (ref 0–44)
AST: 11 U/L — ABNORMAL LOW (ref 15–41)
Albumin: 2.7 g/dL — ABNORMAL LOW (ref 3.5–5.0)
Alkaline Phosphatase: 88 U/L (ref 38–126)
Anion gap: 10 (ref 5–15)
BUN: 24 mg/dL — ABNORMAL HIGH (ref 6–20)
CO2: 26 mmol/L (ref 22–32)
Calcium: 8.9 mg/dL (ref 8.9–10.3)
Chloride: 96 mmol/L — ABNORMAL LOW (ref 98–111)
Creatinine, Ser: 4.51 mg/dL — ABNORMAL HIGH (ref 0.44–1.00)
GFR, Estimated: 11 mL/min — ABNORMAL LOW (ref >60)
Glucose, Bld: 175 mg/dL — ABNORMAL HIGH (ref 70–99)
Potassium: 3.8 mmol/L (ref 3.5–5.1)
Sodium: 132 mmol/L — ABNORMAL LOW (ref 135–145)
Total Bilirubin: 0.8 mg/dL (ref 0.3–1.2)
Total Protein: 5.7 g/dL — ABNORMAL LOW (ref 6.5–8.1)

## 2022-02-18 LAB — CBC
HCT: 31.1 % — ABNORMAL LOW (ref 36.0–46.0)
Hemoglobin: 9.6 g/dL — ABNORMAL LOW (ref 12.0–15.0)
MCH: 27.2 pg (ref 26.0–34.0)
MCHC: 30.9 g/dL (ref 30.0–36.0)
MCV: 88.1 fL (ref 80.0–100.0)
Platelets: 206 10*3/uL (ref 150–400)
RBC: 3.53 MIL/uL — ABNORMAL LOW (ref 3.87–5.11)
RDW: 14.6 % (ref 11.5–15.5)
WBC: 8.1 10*3/uL (ref 4.0–10.5)
nRBC: 0 % (ref 0.0–0.2)

## 2022-02-18 LAB — GLUCOSE, CAPILLARY
Glucose-Capillary: 212 mg/dL — ABNORMAL HIGH (ref 70–99)
Glucose-Capillary: 226 mg/dL — ABNORMAL HIGH (ref 70–99)
Glucose-Capillary: 183 mg/dL — ABNORMAL HIGH (ref 70–99)
Glucose-Capillary: 174 mg/dL — ABNORMAL HIGH (ref 70–99)

## 2022-02-18 LAB — PHOSPHORUS: Phosphorus: 5 mg/dL — ABNORMAL HIGH (ref 2.5–4.6)

## 2022-02-18 MED ORDER — DARBEPOETIN ALFA 60 MCG/0.3ML IJ SOSY
60.0000 ug | PREFILLED_SYRINGE | INTRAMUSCULAR | Status: DC
  Filled 2022-02-18: qty 0.3

## 2022-02-18 MED ORDER — CHLORHEXIDINE GLUCONATE CLOTH 2 % EX PADS: 6.0000 | MEDICATED_PAD | Freq: Every day | CUTANEOUS | Status: DC

## 2022-02-18 NOTE — Care Management Important Message (Signed)
Important Message ? ?Patient Details  ?Name: Amy Moses ?MRN: [id number] ?Date of Birth: [birth year] ? ? ?Medicare Important Message Given:  Yes ? ? ? ? ?Levada Dy  Searra Carnathan-Martin ?02/18/2022, 1:23 PM ?

## 2022-02-18 NOTE — Progress Notes (Signed)
Subjective:  No new issues- s/p HD yest-  removed 3500 with albumin assist-  feels good-  awaiting dranage in wound vac to decrease prior to discharge ?Objective ?Vital signs in last 24 hours: ?Vitals:  ? 02/17/22 1226 02/17/22 1300 02/17/22 2124 02/18/22 0535  ?BP: 111/63 103/65 104/64 104/63  ?Pulse: 69 73 74 72  ?Resp: (!) 22 19 18 16   ?Temp: 97.8 ?F (36.6 ?C) 98 ?F (36.7 ?C) 98.3 ?F (36.8 ?C)   ?TempSrc: Temporal Oral Oral   ?SpO2: 99% 99% 99% 98%  ?Weight: 97.5 kg     ?Height:      ? ?Weight change: 8.013 kg ? ?Intake/Output Summary (Last 24 hours) at 02/18/2022 1135 ?Last data filed at 02/18/2022 0553 ?Gross per 24 hour  ?Intake 240 ml  ?Output 3540 ml  ?Net -3300 ml  ? ? ?Dialyzes at [place]  says 5 hours ?  Been on HD for a year and a half  EDW 95.5. ?HD Bath 2/2.5, Dialyzer unknown, Heparin yes. Access left upper AVF. ? ? ?Assessment/Plan: 51 year old BF with DM and ESRD-  has had complications of foot ulcer  ?1 Foot ulcer-  cultures and started on rocephin /flagyl and vanc.  Dr. [name] involved-  s/p 5th ray amputation on 3/29-  he is following-   says can be discharged when wound vac drainage gets to a certain level  ?2 ESRD: normally TTS via AVF-  will plan on HD tomorrow on schedule ?3 Hypertension/volume: BP is fine/low on no BP meds-  has pitting edema - alb 2.6-  will try to challenge as able but difficult due to low BPS.  Is on torsemide as OP?  Must make some urine-  post HD weight yest 97.5-  will cont to challenge ?4. Anemia of ESRD: hgb greater than 11-  now 9.6 after surgery -  will give ESA ?5. Metabolic Bone Disease: continuing auryxia-  does not seem to be on vitamin D as OP- phos is OK  ? ? ?Louis Meckel  ? ? ?Labs: ?Basic Metabolic Panel: ?Recent Labs  ?Lab 02/16/22 ?0245 02/17/22 ?0202 02/18/22 ?3846  ?NA 134* 135 132*  ?K 3.7 4.1 3.8  ?CL 96* 98 96*  ?CO2 24 24 26   ?GLUCOSE 180* 119* 175*  ?BUN 32* 42* 24*  ?CREATININE 5.18* 5.84* 4.51*  ?CALCIUM 8.5* 8.6* 8.9   ?PHOS 4.9* 6.3* 5.0*  ? ?Liver Function Tests: ?Recent Labs  ?Lab 02/14/22 ?1231 02/15/22 ?0641 02/16/22 ?0245 02/17/22 ?0202 02/18/22 ?6599  ?AST 12*  --   --   --  11*  ?ALT 11  --   --   --  8  ?ALKPHOS 110  --   --   --  88  ?BILITOT 0.6  --   --   --  0.8  ?PROT 6.5  --   --   --  5.7*  ?ALBUMIN 2.9*   < > 2.5* 2.4* 2.7*  ? < > = values in this interval not displayed.  ? ?No results for input(s): LIPASE, AMYLASE in the last 168 hours. ?No results for input(s): AMMONIA in the last 168 hours. ?CBC: ?Recent Labs  ?Lab 02/14/22 ?1231 02/15/22 ?3570 02/17/22 ?0202 02/18/22 ?1779  ?WBC 8.0 7.1 7.9 8.1  ?NEUTROABS 5.8  --  5.5  --   ?HGB 11.1* 11.0* 10.1* 9.6*  ?HCT 36.4 35.0* 32.3* 31.1*  ?MCV 88.3 87.1 87.8 88.1  ?PLT 240 227 209 206  ? ?Cardiac Enzymes: ?No  results for input(s): CKTOTAL, CKMB, CKMBINDEX, TROPONINI in the last 168 hours. ?CBG: ?Recent Labs  ?Lab 02/17/22 ?1914 02/17/22 ?1341 02/17/22 ?1717 02/17/22 ?2004 02/18/22 ?7829  ?GLUCAP 180* 184* 218* 183* 212*  ? ? ?Iron Studies:  ?Recent Labs  ?  02/17/22 ?0202  ?IRON 97  ?TIBC 171*  ?FERRITIN 414*  ? ?Studies/Results: ?No results found. ?Medications: ?Infusions: ? sodium chloride Stopped (02/16/22 2008)  ? albumin human 25 g (02/17/22 0842)  ? magnesium sulfate bolus IVPB    ? ? ?Scheduled Medications: ? acidophilus  2 capsule Oral TID  ? vitamin C  1,000 mg Oral Daily  ? aspirin EC  81 mg Oral Q breakfast  ? atorvastatin  10 mg Oral QHS  ? Chlorhexidine Gluconate Cloth  6 each Topical Q0600  ? docusate sodium  100 mg Oral Daily  ? doxycycline  100 mg Oral Q12H  ? ferric citrate  420 mg Oral BID WC  ? heparin  5,000 Units Subcutaneous Q8H  ? insulin aspart  0-15 Units Subcutaneous TID WC  ? insulin glargine-yfgn  8 Units Subcutaneous QHS  ? levothyroxine  50 mcg Oral QAC breakfast  ? nutrition supplement (JUVEN)  1 packet Oral BID BM  ? pantoprazole  40 mg Oral BID  ? pentoxifylline  400 mg Oral TID WC  ? sodium chloride flush  3 mL Intravenous Q12H  ?  torsemide  150 mg Oral Daily  ? zinc sulfate  220 mg Oral Daily  ? ? have reviewed scheduled and prn medications. ? ?Physical Exam: ?General:  NAD ?Heart: RRR ?Lungs: dec BS at extreme bases ?Abdomen: soft, non tender ?Extremities: compression hose-  has pitting edema in thighs -  foot now post op with wound vac ?Dialysis Access: left upper AVF  ? ? ?02/18/2022,11:35 AM ? LOS: 4 days  ? ?  ? ? ? ? ?

## 2022-02-18 NOTE — Progress Notes (Signed)
?PROGRESS NOTE ? ?Amy Moses  MHD:[id number] DOB: October 08, 1971 DOA: 02/14/2022 ?PCP: Emelda Fear, DO  ? ?Brief Narrative: ? ?48-blk fem ?hypertension, hyperlipidemia, ESRD on dialysis on TTS schedule,  ?diabetes type 2 ?hypothyroidism, right eye blindness [not dm related] ?calciphylaxis of bilateral lower extremities who presented 3/27 from Dr. Jess Barters clinic with worsening right foot wound.   ? hospitalized from 3/6 - 3/8 for ischemic ulcer of the right foot and was discharged on oral antibiotics. ? ?Evaluation at Dr. Jess Barters office showed large abscess and necrotic ulcer on the lateral aspect of the right foot.  ? X-ray showed osteomyelitis of the base of fifth metatarsal with diffuse soft tissue swelling, concern for infection.   ?Started on broad-spectrum antibiotics.  Orthopedics saw patient and took to OR 3/29  ? ?antibiotics have been narrowed-patient Stabilizing ? ?Assessment & Plan: ? ?Principal Problem: ?  Osteomyelitis of foot ([place]) ?Active Problems: ?  Ischemic ulcer of right foot ([place]) ?  End stage renal disease ([place]) ?  Uncontrolled type 2 diabetes mellitus with hyperglycemia, with long-term current use of insulin ([place]) ?  Primary hypothyroidism ?  Hyperlipidemia ?  Normocytic anemia ?  Essential hypertension, benign ?  Calciphylaxis ?  Cutaneous abscess of right foot ? ?Osteomyelitis of the right foot:  ?Calciphylaxis-indolent-status post ray amputation 3/29 Dr. [name] ?-vancomycin metronidazole, Rocephin-->doxycycline 100 bid ?-Per Dr. Duda-calciphylaxis is pretty indolent and is difficult to manage so would need to review wound frequently ?-Continue pain management with oxycodone 5 every 6 as needed, morphine 0.5-1 q 2 prn ?-Continue aspirin 81 mg ?-Continue Trental 400 3 times daily for rest pain ?-Once fluid output is less than 50 from the back patient can discharge home ? ?ESRD:  ?-Dialyzed on TTS schedule.  Nephrology consulted and following.  ?-Continue torsemide 150 daily for now and will  discontinue as outpatient per nephrology ?-Hemodialysis access on the left arm ? ?Uncontrolled type 2 diabetes with hyperglycemia with long-term insulin use: A1c of 12.2 as per 1/23.   ?Increased long-acting insulin to 8 units at bedtime, 3 times daily meals insulin NovoLog ?CBG 183-226 ? ?Hypothyroidism: Continue Synthyroid and get TSH in the outpatient setting ? ?Hyperlipidemia: Continue Lipitor 10 ? ?Normocytic anemia: Currently hemoglobin stable, associated with chronic kidney disease ?Iron reasonable, TSAT 57 ?Continue Auryxia per renal ? ?Hypertension: Monitor blood pressure --does not appear to be on PTA meds 2/2 hypotensions c iHD?  Other than torsemide ? ?Calciphylaxis: On both lower extremities with stigmata of prior surgeries.  History of prior skin grafting, follows with Dr. [name] ? ?Sacral ulcer: Stage III decubitus not visualized today-Per wound care ? ? ?DVT prophylaxis:SCD's Start: 02/16/22 1532 ?heparin injection 5,000 Units Start: 02/14/22 1500 ?SCDs Start: 02/14/22 1446 ? ? ?  Code Status: Full Code ? ?Family Communication: None at bedside ?Patient status: Inpatient ?Patient is from : Home ?Anticipated discharge to: Home ?Estimated DC date: ?'after orthopedics clearance and after drain output is decreased and diminished ? ? ?Consultants: Orthopedics ? ?Procedures:None yet ? ?Antimicrobials:  ? ? ? ?Subjective: ? ?Doing fair--pain is controlled, no current chest pain fever chills ? ?Objective: ?Vitals:  ? 02/17/22 1300 02/17/22 2124 02/18/22 0535 02/18/22 1548  ?BP: 103/65 104/64 104/63   ?Pulse: 73 74 72 75  ?Resp: 19 18 16    ?Temp: 98 ?F (36.7 ?C) 98.3 ?F (36.8 ?C)  (!) 97.5 ?F (36.4 ?C)  ?TempSrc: Oral Oral  Oral  ?SpO2: 99% 99% 98% 98%  ?Weight:      ?Height:      ? ? ?  Intake/Output Summary (Last 24 hours) at 02/18/2022 1645 ?Last data filed at 02/18/2022 0553 ?Gross per 24 hour  ?Intake 240 ml  ?Output 40 ml  ?Net 200 ml  ? ? ?Filed Weights  ? 02/16/22 1244 02/17/22 0803 02/17/22 1226  ?Weight:  93 kg 101 kg 97.5 kg  ? ? ?Examination: ? ?Eomi ncat no ict, postoperative changes to right eye- ?asthenic ?Diffuse rash over chest seems chronic ?Cta b no added sound ?S1 S2 no m/r/g ?Abd soft nt nd no rebound no gaurd ?Drain putting out >300 cc ? ? ?Data Reviewed: I have personally reviewed following labs and imaging studies ? ?CBC: ?Recent Labs  ?Lab 02/14/22 ?1231 02/15/22 ?9371 02/17/22 ?0202 02/18/22 ?6967  ?WBC 8.0 7.1 7.9 8.1  ?NEUTROABS 5.8  --  5.5  --   ?HGB 11.1* 11.0* 10.1* 9.6*  ?HCT 36.4 35.0* 32.3* 31.1*  ?MCV 88.3 87.1 87.8 88.1  ?PLT 240 227 209 206  ? ? ?Basic Metabolic Panel: ?Recent Labs  ?Lab 02/14/22 ?1231 02/15/22 ?0641 02/16/22 ?0245 02/17/22 ?0202 02/18/22 ?8938  ?NA 135 134* 134* 135 132*  ?K 3.7 3.8 3.7 4.1 3.8  ?CL 97* 94* 96* 98 96*  ?CO2 26 24 24 24 26   ?GLUCOSE 229* 351* 180* 119* 175*  ?BUN 47* 56* 32* 42* 24*  ?CREATININE 6.21* 6.84* 5.18* 5.84* 4.51*  ?CALCIUM 8.9 8.5* 8.5* 8.6* 8.9  ?PHOS  --  6.9* 4.9* 6.3* 5.0*  ? ? ? ? ?Recent Results (from the past 240 hour(s))  ?Surgical pcr screen     Status: None  ? Collection Time: 02/16/22 10:20 AM  ? Specimen: Nasal Mucosa; Nasal Swab  ?Result Value Ref Range Status  ? MRSA, PCR NEGATIVE NEGATIVE Final  ? Staphylococcus aureus NEGATIVE NEGATIVE Final  ?  Comment: (NOTE) ?The Xpert SA Assay (FDA approved for NASAL specimens in patients 57 ?years of age and older), is one component of a comprehensive ?surveillance program. It is not intended to diagnose infection nor to ?guide or monitor treatment. ?Performed at [facility], [place] [address], [place], [place] ?[address] ?  ?  ? ?Radiology Studies: ?No results found. ? ?Scheduled Meds: ? acidophilus  2 capsule Oral TID  ? vitamin C  1,000 mg Oral Daily  ? aspirin EC  81 mg Oral Q breakfast  ? atorvastatin  10 mg Oral QHS  ? Chlorhexidine Gluconate Cloth  6 each Topical Q0600  ? [START ON 02/19/2022] Chlorhexidine Gluconate Cloth  6 each Topical Q0600  ? [START ON 02/19/2022] darbepoetin  (ARANESP) injection - DIALYSIS  60 mcg Intravenous Q Sat-HD  ? docusate sodium  100 mg Oral Daily  ? doxycycline  100 mg Oral Q12H  ? ferric citrate  420 mg Oral BID WC  ? heparin  5,000 Units Subcutaneous Q8H  ? insulin aspart  0-15 Units Subcutaneous TID WC  ? insulin glargine-yfgn  8 Units Subcutaneous QHS  ? levothyroxine  50 mcg Oral QAC breakfast  ? nutrition supplement (JUVEN)  1 packet Oral BID BM  ? pantoprazole  40 mg Oral BID  ? pentoxifylline  400 mg Oral TID WC  ? sodium chloride flush  3 mL Intravenous Q12H  ? torsemide  150 mg Oral Daily  ? zinc sulfate  220 mg Oral Daily  ? ?Continuous Infusions: ? sodium chloride Stopped (02/16/22 2008)  ? albumin human 25 g (02/17/22 0842)  ? magnesium sulfate bolus IVPB    ? ? ? LOS: 4 days  ? ?Joyce Gross  Verlon Au, MD ?[facility] ?4:45 PM ? ?

## 2022-02-18 NOTE — Progress Notes (Signed)
Pt receives out-pt HD at [facility] on TTS. Pt has a 5:45 chair time. Spoke to [place] at the clinic this afternoon and will update clinic on pt's d/c date once known. Will assist as needed. ? ?Melven Sartorius ?Renal Navigator ?[phone] ?

## 2022-02-18 NOTE — TOC Initial Note (Addendum)
Transition of Care (TOC) - Initial/Assessment Note  ? ? ?Patient Details  ?Name: Amy Moses ?MRN: [id number] ?Date of Birth: March 14, 1971 ? ?Transition of Care (TOC) CM/SW Contact:    ?Verdell Carmine, RN ?Phone Number: ?02/18/2022, 7:21 AM ? ?Clinical Narrative:                 ? ?The Transition of Care Department [facility]) has reviewed patient and no TOC needs have been identified at this time. We will continue to monitor patient advancement through interdisciplinary progression rounds. If new patient transition needs arise, please place a TOC consult ?  ? NO PT OT needs, her MD office is working on seat cushion for [facility] ? ? ?Patient Goals and CMS Choice ?  ?  ?  ? ?Expected Discharge Plan and Services ?  ?  ?  ?  ?  ?                ?  ?  ?  ?  ?  ?  ?  ?  ?  ?  ? ?Prior Living Arrangements/Services ?  ?  ?  ?       ?  ?  ?  ?  ? ?Activities of Daily Living ?Home Assistive Devices/Equipment: Wheelchair ?ADL Screening (condition at time of admission) ?Patient's cognitive ability adequate to safely complete daily activities?: Yes ?Is the patient deaf or have difficulty hearing?: No ?Does the patient have difficulty seeing, even when wearing glasses/contacts?: No ?Does the patient have difficulty concentrating, remembering, or making decisions?: No ?Patient able to express need for assistance with ADLs?: Yes ?Does the patient have difficulty dressing or bathing?: No ?Independently performs ADLs?: Yes (appropriate for developmental age) ?Does the patient have difficulty walking or climbing stairs?: Yes ?Weakness of Legs: Both ?Weakness of Arms/Hands: None ? ?Permission Sought/Granted ?  ?  ?   ?   ?   ?   ? ?Emotional Assessment ?  ?  ?  ?  ?  ?  ? ?Admission diagnosis:  Wound infection [T14.8XXA, L08.9] ?Osteomyelitis of foot ([place]) [M86.9] ?Patient Active Problem List  ? Diagnosis Date Noted  ? Cutaneous abscess of right foot   ? Osteomyelitis of foot ([place]) 02/14/2022  ? Obesity (BMI 30-39.9) 01/26/2022  ? Ischemic  ulcer of right foot ([place]) 01/24/2022  ? Normocytic anemia 01/24/2022  ? Sacral pressure ulcer 01/24/2022  ? Metabolic acidosis, increased anion gap 01/24/2022  ? Leg wound, left, sequela 12/10/2021  ? Open leg wound 09/03/2021  ? Wound infection   ? Non-pressure chronic ulcer of right calf limited to breakdown of skin ([place])   ? Calciphylaxis of right lower extremity with nonhealing ulcer, limited to breakdown of skin ([place])   ? Chronic ulcer of left thigh ([place]) 08/07/2021  ? Calciphylaxis of left lower extremity with nonhealing ulcer with necrosis of muscle ([place]) 08/07/2021  ? Lower GI bleed 05/12/2021  ? Acute GI bleeding 04/24/2021  ? Acute blood loss anemia 04/23/2021  ? GI bleed 04/22/2021  ? Fever 03/31/2021  ? Acute on chronic heart failure with preserved ejection fraction (HFpEF) ([place]) 03/30/2021  ? End stage renal disease ([place]) 11/16/2020  ? Calciphylaxis 11/06/2020  ? Non-healing open wound of heel 11/03/2020  ? Diabetic foot infection ([place]) 11/01/2020  ? Decubitus ulcer, heel 11/01/2020  ? Closed nondisplaced fracture of left patella 10/29/2020  ? Metabolic acidosis 09/73/5329  ? Acute on chronic renal failure ([place]) 06/10/2020  ? Acute pericardial effusion 06/10/2020  ? Chronic kidney  disease, stage 4 (severe) ([place]) 03/05/2019  ? Vitamin D deficiency 01/28/2019  ? Uncontrolled type 2 diabetes mellitus with hyperglycemia, with long-term current use of insulin ([place]) 09/21/2015  ? Hyperlipidemia 09/21/2015  ? Essential hypertension, benign 09/21/2015  ? Primary hypothyroidism 09/21/2015  ? Iris bomb? 07/31/2012  ? Secondary angle-closure glaucoma 07/31/2012  ? ?PCP:  Emelda Fear, DO ?Pharmacy:   ?[id number] [place] ?[place] [address] ?Phone: [phone] Fax: [phone] ? ? ? ? ?Social Determinants of Health (SDOH) Interventions ?  ? ?Readmission Risk Interventions ? ?  01/26/2022  ?  2:26 PM 05/14/2021  ?  2:46 PM  ?Readmission Risk Prevention  Plan  ?Transportation Screening Complete Complete  ?PCP or Specialist Appt within 3-5 Days Complete   ?[place] or Home Care Consult Complete   ?Social Work Consult for [place] Planning/Counseling Complete   ?Palliative Care Screening Not Applicable   ?Medication Review Press photographer) Complete Complete  ?[place] or Home Care Consult  Complete  ?SW Recovery Care/Counseling Consult  Complete  ?Palliative Care Screening  Not Applicable  ?[place]  Not Applicable  ? ? ? ?

## 2022-02-18 NOTE — Progress Notes (Signed)
Mobility Specialist Progress Note  ? ? 02/18/22 1525  ?Mobility  ?Activity Transferred from bed to chair  ?Level of Assistance Contact guard assist, steadying assist  ?Assistive Device None  ?Activity Response Tolerated well  ?$Mobility charge 1 Mobility  ? ?Left with call bell in reach.  ? ?Hildred Alamin ?Mobility Specialist  ?  ?

## 2022-02-18 NOTE — Progress Notes (Signed)
Orthopedic Tech Progress Note ?Patient Details:  ?Amy Moses ?February 08, 1971 ?[id number] ? ?Ortho Devices ?Type of Ortho Device: Postop shoe/boot ?Ortho Device/Splint Location: rle ?Ortho Device/Splint Interventions: Ordered ?  ?  ? ?Edwina Barth ?02/18/2022, 2:31 PM ? ?

## 2022-02-19 DIAGNOSIS — M86271 Subacute osteomyelitis, right ankle and foot: Secondary | ICD-10-CM | POA: Diagnosis not present

## 2022-02-19 LAB — GLUCOSE, CAPILLARY
Glucose-Capillary: 239 mg/dL — ABNORMAL HIGH (ref 70–99)
Glucose-Capillary: 170 mg/dL — ABNORMAL HIGH (ref 70–99)

## 2022-02-19 MED ORDER — HYDROCODONE-ACETAMINOPHEN 5-325 MG PO TABS: 1.0000 | ORAL_TABLET | ORAL | 0 refills | Status: DC | PRN

## 2022-02-19 MED ORDER — POLYETHYLENE GLYCOL 3350 17 G PO PACK: 17.0000 g | PACK | Freq: Every day | ORAL | 0 refills | Status: DC | PRN

## 2022-02-19 MED ORDER — TRAMADOL HCL 50 MG PO TABS: 50.0000 mg | ORAL_TABLET | Freq: Four times a day (QID) | ORAL | 0 refills | Status: DC | PRN

## 2022-02-19 MED ORDER — DOXYCYCLINE HYCLATE 100 MG PO TABS
100.0000 mg | ORAL_TABLET | Freq: Two times a day (BID) | ORAL | 0 refills | Status: AC
Start: 2022-02-19 — End: 2022-03-12

## 2022-02-19 MED ORDER — ASCORBIC ACID 1000 MG PO TABS: 1000.0000 mg | ORAL_TABLET | Freq: Every day | ORAL | Status: DC

## 2022-02-19 NOTE — Progress Notes (Signed)
Subjective:  seen on HD -  she tells me that she will be going home today after HD-  tolerating the UF ?Objective ?Vital signs in last 24 hours: ?Vitals:  ? 02/19/22 0930 02/19/22 1000 02/19/22 1030 02/19/22 1100  ?BP: (!) 93/58 (!) 93/55 103/61 100/65  ?Pulse: 69 71 71 71  ?Resp: 18 19 (!) 21 20  ?Temp:      ?TempSrc:      ?SpO2: 98% 98% 98% 98%  ?Weight:      ?Height:      ? ?Weight change:  ? ?Intake/Output Summary (Last 24 hours) at 02/19/2022 1123 ?Last data filed at 02/19/2022 0500 ?Gross per 24 hour  ?Intake 220 ml  ?Output 0 ml  ?Net 220 ml  ? ? ?Dialyzes at [facility]  says 5 hours ?  Been on HD for a year and a half  EDW 95.5. ?HD Bath 2/2.5, Dialyzer unknown, Heparin yes. Access left upper AVF. ? ? ?Assessment/Plan: 51 year old BF with DM and ESRD-  has had complications of foot ulcer  ?1 Foot ulcer-  cultures and started on rocephin /flagyl and vanc.  Dr. [name] involved-  s/p 5th ray amputation on 3/29-  he is following-   says can be discharged when wound vac drainage gets to a certain level -  looks like today  ?2 ESRD: normally TTS via AVF-  will plan on HD today on schedule-  can resume at OP center on Tuesday  ?3 Hypertension/volume: BP is fine/low on no BP meds-  has pitting edema - alb 2.6-  will try to challenge as able but difficult due to low BPS.  Is on torsemide as OP?  Must make some urine-  post HD weight yest 97.5-  will cont to challenge ?4. Anemia of ESRD: hgb greater than 11-  now 9.6 after surgery -  will give ESA ?5. Metabolic Bone Disease: continuing auryxia-  does not seem to be on vitamin D as OP- phos is OK  ? ? ?Louis Meckel  ? ? ?Labs: ?Basic Metabolic Panel: ?Recent Labs  ?Lab 02/16/22 ?0245 02/17/22 ?0202 02/18/22 ?5277  ?NA 134* 135 132*  ?K 3.7 4.1 3.8  ?CL 96* 98 96*  ?CO2 24 24 26   ?GLUCOSE 180* 119* 175*  ?BUN 32* 42* 24*  ?CREATININE 5.18* 5.84* 4.51*  ?CALCIUM 8.5* 8.6* 8.9  ?PHOS 4.9* 6.3* 5.0*  ? ?Liver Function Tests: ?Recent Labs  ?Lab  02/14/22 ?1231 02/15/22 ?0641 02/16/22 ?0245 02/17/22 ?0202 02/18/22 ?8242  ?AST 12*  --   --   --  11*  ?ALT 11  --   --   --  8  ?ALKPHOS 110  --   --   --  88  ?BILITOT 0.6  --   --   --  0.8  ?PROT 6.5  --   --   --  5.7*  ?ALBUMIN 2.9*   < > 2.5* 2.4* 2.7*  ? < > = values in this interval not displayed.  ? ?No results for input(s): LIPASE, AMYLASE in the last 168 hours. ?No results for input(s): AMMONIA in the last 168 hours. ?CBC: ?Recent Labs  ?Lab 02/14/22 ?1231 02/15/22 ?3536 02/17/22 ?0202 02/18/22 ?1443  ?WBC 8.0 7.1 7.9 8.1  ?NEUTROABS 5.8  --  5.5  --   ?HGB 11.1* 11.0* 10.1* 9.6*  ?HCT 36.4 35.0* 32.3* 31.1*  ?MCV 88.3 87.1 87.8 88.1  ?PLT 240 227 209 206  ? ?Cardiac Enzymes: ?No  results for input(s): CKTOTAL, CKMB, CKMBINDEX, TROPONINI in the last 168 hours. ?CBG: ?Recent Labs  ?Lab 02/18/22 ?6761 02/18/22 ?1142 02/18/22 ?1642 02/18/22 ?2106 02/19/22 ?9509  ?GLUCAP 212* 226* 183* 174* 239*  ? ? ?Iron Studies:  ?Recent Labs  ?  02/17/22 ?0202  ?IRON 97  ?TIBC 171*  ?FERRITIN 414*  ? ?Studies/Results: ?No results found. ?Medications: ?Infusions: ? sodium chloride Stopped (02/16/22 2008)  ? albumin human 25 g (02/19/22 0931)  ? magnesium sulfate bolus IVPB    ? ? ?Scheduled Medications: ? acidophilus  2 capsule Oral TID  ? vitamin C  1,000 mg Oral Daily  ? aspirin EC  81 mg Oral Q breakfast  ? atorvastatin  10 mg Oral QHS  ? Chlorhexidine Gluconate Cloth  6 each Topical Q0600  ? Chlorhexidine Gluconate Cloth  6 each Topical Q0600  ? darbepoetin (ARANESP) injection - DIALYSIS  60 mcg Intravenous Q Sat-HD  ? docusate sodium  100 mg Oral Daily  ? doxycycline  100 mg Oral Q12H  ? ferric citrate  420 mg Oral BID WC  ? heparin  5,000 Units Subcutaneous Q8H  ? insulin aspart  0-15 Units Subcutaneous TID WC  ? insulin glargine-yfgn  8 Units Subcutaneous QHS  ? levothyroxine  50 mcg Oral QAC breakfast  ? nutrition supplement (JUVEN)  1 packet Oral BID BM  ? pantoprazole  40 mg Oral BID  ? pentoxifylline  400 mg  Oral TID WC  ? sodium chloride flush  3 mL Intravenous Q12H  ? torsemide  150 mg Oral Daily  ? zinc sulfate  220 mg Oral Daily  ? ? have reviewed scheduled and prn medications. ? ?Physical Exam: ?General:  NAD ?Heart: RRR ?Lungs: dec BS at extreme bases ?Abdomen: soft, non tender ?Extremities: compression hose-  has pitting edema in thighs -  foot now post op with wound vac ?Dialysis Access: left upper AVF  ? ? ?02/19/2022,11:23 AM ? LOS: 5 days  ? ?  ? ? ? ? ?

## 2022-02-19 NOTE — Progress Notes (Signed)
Patient ID: Amy Moses, female   DOB: 08-Jan-1971, 51 y.o.   MRN: [id number] ?Patient has no additional drainage in the wound VAC canister there is still 250 cc.  I connected her to the Praveena plus pump there is no leakage.  Anticipate patient can discharge today after dialysis.  Patient may be weightbearing as tolerated for transfers. ?

## 2022-02-19 NOTE — TOC Transition Note (Signed)
Transition of Care (TOC) - CM/SW Discharge Note ? ? ?Patient Details  ?Name: Amy Moses ?MRN: [id number] ?Date of Birth: 07/02/1971 ? ?Transition of Care (TOC) CM/SW Contact:  ?Bartholomew Crews, RN ?Phone Number: [phone] ?02/19/2022, 11:18 AM ? ? ?Clinical Narrative:    ? ?Patient to transition home after hemodialysis. No TOC needs identified at this time.  ? ?Final next level of care: Home/Self Care ?Barriers to Discharge: No Barriers Identified ? ? ?Patient Goals and CMS Choice ?  ?  ?  ? ?Discharge Placement ?  ?           ?  ?  ?  ?  ? ?Discharge Plan and Services ?  ?  ?           ?  ?  ?  ?  ?  ?  ?  ?  ?  ?  ? ?Social Determinants of Health (SDOH) Interventions ?  ? ? ?Readmission Risk Interventions ? ?  01/26/2022  ?  2:26 PM 05/14/2021  ?  2:46 PM  ?Readmission Risk Prevention Plan  ?Transportation Screening Complete Complete  ?PCP or Specialist Appt within 3-5 Days Complete   ?[place] or Home Care Consult Complete   ?Social Work Consult for [place] Planning/Counseling Complete   ?Palliative Care Screening Not Applicable   ?Medication Review Press photographer) Complete Complete  ?[place] or Home Care Consult  Complete  ?SW Recovery Care/Counseling Consult  Complete  ?Palliative Care Screening  Not Applicable  ?[place]  Not Applicable  ? ? ? ? ? ?

## 2022-02-19 NOTE — Procedures (Signed)
Patient was seen on dialysis and the procedure was supervised.  BFR 400  Via AVF BP is  100/65. ? ? Patient appears to be tolerating treatment well-  attempting UF for pitting edema  ? ?Amy Moses ?02/19/2022 ? ?

## 2022-02-19 NOTE — Discharge Summary (Signed)
?Physician Discharge Summary ?  ?Patient: Amy Moses MRN: [id number] DOB: Jan 07, 1971  ?Admit date:     02/14/2022  ?Discharge date: 02/19/22  ?Discharge Physician: Nita Sells  ? ?PCP: Emelda Fear, DO  ? ?Recommendations at discharge:  ? ?Needs outpatient CBC renal panel in 1 week, iron studies as well as TSH in about 3 weeks ?Follow-up outpatient closely with orthopedic surgery Dr. Wallis Mart medications this admission doxycycline for 3 weeks post discharge ?Limited prescription pain control given to patient ?Consider down titration of Trental in the outpatient setting ? ?Discharge Diagnoses: ?Principal Problem: ?  Osteomyelitis of foot ([place]) ?Active Problems: ?  Ischemic ulcer of right foot ([place]) ?  End stage renal disease ([place]) ?  Uncontrolled type 2 diabetes mellitus with hyperglycemia, with long-term current use of insulin ([place]) ?  Primary hypothyroidism ?  Hyperlipidemia ?  Normocytic anemia ?  Essential hypertension, benign ?  Calciphylaxis ?  Cutaneous abscess of right foot ? ?Resolved Problems: ?  * No resolved hospital problems. * ? ?Hospital Course: ?48-blk fem ?hypertension, hyperlipidemia, ESRD on dialysis on TTS schedule,  ?diabetes type 2 ?hypothyroidism, right eye blindness [not dm related] ?calciphylaxis of bilateral lower extremities who presented 3/27 from Dr. Jess Barters clinic with worsening right foot wound.   ? hospitalized from 3/6 - 3/8 for ischemic ulcer of the right foot and was discharged on oral antibiotics. ?  ?Evaluation at Dr. Jess Barters office showed large abscess and necrotic ulcer on the lateral aspect of the right foot.  ? X-ray showed osteomyelitis of the base of fifth metatarsal with diffuse soft tissue swelling, concern for infection.   ?Started on broad-spectrum antibiotics.  Orthopedics saw patient and took to OR 3/29  ?  ?antibiotics have been narrowed-patient Stabilizing ?  ? ?Osteomyelitis of the right foot:  ?Calciphylaxis-indolent-status post ray amputation 3/29 Dr.  [name] ?-vancomycin metronidazole, Rocephin-->doxycycline 100 bid ?-Per Dr. Duda-calciphylaxis is pretty indolent and is difficult to manage so would need to review wound frequently-he will arrange follow-up in the outpatient setting ?-Discharging home on tramadol limited prescription ?-Continue aspirin 81 mg ?-Continue Trental 400 3 times daily for rest pain ?- Patient's wound VAC was converted to Praveena portable wound VAC and patient will be seen by Dr. [name] in the outpatient setting and is aware of this ?  ?ESRD:  ?-Dialyzed on TTS schedule.  Nephrology it appears that HD IS challenged by hypotension during procedure and patient does require intermittent albumin ?-Continue torsemide 150 daily for now and will discontinue as outpatient  ?-Hemodialysis access on the left arm ?  ?Uncontrolled type 2 diabetes with hyperglycemia with long-term insulin use: A1c of 12.2 as per 1/23.   ?Patient was on long-acting 8 units and sliding scale during hospital stay with CBG 183-226 ?I resumed her home regimen on discharge ? ?Hypothyroidism: Continue Synthyroid and get TSH in the outpatient setting ?  ?Hyperlipidemia: Continue Lipitor 10 ?  ?Normocytic anemia: Currently hemoglobin stable, associated with chronic kidney disease ?Iron reasonable, TSAT 57 ?Continue Auryxia per renal ?  ?Hypertension: Monitor blood pressure --does not appear to be on PTA meds 2/2 hypotensions c iHD?  Other than torsemide ?  ?Calciphylaxis: On both lower extremities with stigmata of prior surgeries.  History of prior skin grafting, follows with Dr. [name] ?  ?Sacral ulcer: Stage III decubitus not visualized today-Per wound care ?  ? ? ?  ? ? ?Consultants: Nephrology, orthopedics ?Procedures performed: Ray amputation as above ?Disposition: Home ?Diet recommendation:  ?Discharge Diet Orders (From admission, onward)  ? ?  Start     Ordered  ? 02/19/22 0000  Diet - low sodium heart healthy       ? 02/19/22 1113  ? ?  ?  ? ?  ? ?Renal diet ?DISCHARGE  MEDICATION: ?Allergies as of 02/19/2022   ? ?   Reactions  ? Ace Inhibitors Cough  ? ?  ? ?  ?Medication List  ?  ? ?STOP taking these medications   ? ?oxyCODONE 5 MG immediate release tablet ?Commonly known as: Oxy IR/ROXICODONE ?  ? ?  ? ?TAKE these medications   ? ?acetaminophen 500 MG tablet ?Commonly known as: TYLENOL ?Take 1,000 mg by mouth every 6 (six) hours as needed for moderate pain. ?  ?ascorbic acid 1000 MG tablet ?Commonly known as: VITAMIN C ?Take 1 tablet (1,000 mg total) by mouth daily. ?  ?aspirin EC 81 MG tablet ?Take 1 tablet (81 mg total) by mouth daily with breakfast. ?  ?atorvastatin 10 MG tablet ?Commonly known as: LIPITOR ?Take 1 tablet (10 mg total) by mouth daily. ?What changed: when to take this ?  ?Auryxia 1 GM 210 MG(Fe) tablet ?Generic drug: ferric citrate ?Take 420 mg by mouth 2 (two) times daily with a meal. ?  ?doxycycline 100 MG tablet ?Commonly known as: VIBRA-TABS ?Take 1 tablet (100 mg total) by mouth every 12 (twelve) hours for 21 days. ?  ?HumaLOG KwikPen 100 UNIT/ML KwikPen ?Generic drug: insulin lispro ?Inject 2 Units into the skin 3 (three) times daily with meals. If eats 50% or more of meal. ?What changed:  ?how much to take ?when to take this ?additional instructions ?  ?insulin glargine 100 UNIT/ML injection ?Commonly known as: LANTUS ?Inject 0.08 mLs (8 Units total) into the skin at bedtime. ?What changed: how much to take ?  ?lactulose 10 GM/15ML solution ?Commonly known as: [place] ?Take 10 g by mouth daily as needed for moderate constipation. ?  ?levothyroxine 50 MCG tablet ?Commonly known as: SYNTHROID ?Take 1 tablet (50 mcg total) by mouth daily before breakfast. ?  ?pantoprazole 40 MG tablet ?Commonly known as: Protonix ?Take 1 tablet (40 mg total) by mouth 2 (two) times daily. ?  ?pentoxifylline 400 MG CR tablet ?Commonly known as: TRENTAL ?Take 1 tablet (400 mg total) by mouth 3 (three) times daily with meals. ?  ?polyethylene glycol 17 g packet ?Commonly known  as: MIRALAX / GLYCOLAX ?Take 17 g by mouth daily as needed for mild constipation. ?  ?torsemide 100 MG tablet ?Commonly known as: DEMADEX ?Take 150 mg by mouth daily. ?  ?traMADol 50 MG tablet ?Commonly known as: Ultram ?Take 1 tablet (50 mg total) by mouth every 6 (six) hours as needed. ?  ?Vitamin D (Ergocalciferol) 1.25 MG (50000 UNIT) Caps capsule ?Commonly known as: DRISDOL ?Take 50,000 Units by mouth every 14 (fourteen) days. ?  ? ?  ? ?  ?  ? ? ?  ?Discharge Care Instructions  ?(From admission, onward)  ?  ? ? ?  ? ?  Start     Ordered  ? 02/19/22 0000  Weight bearing as tolerated       ?Question Answer Comment  ?Laterality right   ?Extremity Lower   ?  ? 02/19/22 0914  ? 02/19/22 0000  No dressing needed       ? 02/19/22 1113  ? ?  ?  ? ?  ? ? Follow-up Information   ? ? Newt Minion, MD Follow up in 1 week(s).   ?Specialty: Orthopedic  Surgery ?Contact information: ?[address] ?[place] [address] ?[phone] ? ? ?  ?  ? ?  ?  ? ?  ? ?Discharge Exam: ?Filed Weights  ? 02/17/22 0803 02/17/22 1226 02/19/22 0844  ?Weight: 101 kg 97.5 kg 97.5 kg  ? ?Awake coherent no distress EOMI NCAT ?ROM intact moving 4 limbs equally ?She has a VAC on ?She has some denudation of skin in addition to excoriated areas ?Her lower extremities has postop stigmata from her prior calciphylaxis surgeries ? ? ?Condition at discharge: good ? ?The results of significant diagnostics from this hospitalization (including imaging, microbiology, ancillary and laboratory) are listed below for reference.  ? ?Imaging Studies: ?DG Ankle Complete Right ? ?Result Date: 02/14/2022 ?CLINICAL DATA:  Right ankle with EXAM: RIGHT ANKLE - COMPLETE 3+ VIEW COMPARISON:  None. FINDINGS: There is diffuse soft tissue swelling. There is a lateral midfoot/hindfoot wound with adjacent soft tissue gas and lucency and irregularity of the base of the fifth metatarsal. Vascular calcifications. Prior ankle fixation. Intact hardware without evidence of  loosening. Extensive vascular calcifications. IMPRESSION: Findings suspicious for lateral midfoot/hindfoot wound with soft tissue infection and underlying osteomyelitis at the base of the fifth metatarsal. Diffu

## 2022-02-21 ENCOUNTER — Encounter: Payer: Medicare Other | Admitting: Orthopedic Surgery

## 2022-02-21 NOTE — Progress Notes (Signed)
Late Entry Note: ? ?Pt was d/c to home on 4/1. Contacted [facility] this am and spoke to [place]. Clinic advised of pt's d/c and that pt should resume care tomorrow. D/C summary and last renal noted faxed to clinic today (fax#[phone]) per their request for continuation of care.  ? ?Melven Sartorius ?Renal Navigator ?[phone] ?

## 2022-02-21 NOTE — Telephone Encounter (Signed)
Wheelchair order was faxed to her preferred DME on 01/18/22. ?

## 2022-02-22 ENCOUNTER — Encounter (HOSPITAL_COMMUNITY): Payer: Self-pay | Admitting: Orthopedic Surgery

## 2022-02-22 NOTE — Telephone Encounter (Signed)
Faxed order and notes again to medical modalities at fax# [phone] ?

## 2022-02-24 ENCOUNTER — Ambulatory Visit (INDEPENDENT_AMBULATORY_CARE_PROVIDER_SITE_OTHER): Payer: Medicare Other | Admitting: Orthopedic Surgery

## 2022-02-24 ENCOUNTER — Telehealth: Payer: Self-pay

## 2022-02-24 ENCOUNTER — Other Ambulatory Visit: Payer: Self-pay

## 2022-02-24 DIAGNOSIS — L02611 Cutaneous abscess of right foot: Secondary | ICD-10-CM

## 2022-02-24 DIAGNOSIS — L97923 Non-pressure chronic ulcer of unspecified part of left lower leg with necrosis of muscle: Secondary | ICD-10-CM

## 2022-02-24 DIAGNOSIS — L97911 Non-pressure chronic ulcer of unspecified part of right lower leg limited to breakdown of skin: Secondary | ICD-10-CM

## 2022-02-24 DIAGNOSIS — I96 Gangrene, not elsewhere classified: Secondary | ICD-10-CM

## 2022-02-24 MED ORDER — SILVER SULFADIAZINE 1 % EX CREA: 1.0000 "application " | TOPICAL_CREAM | Freq: Every day | CUTANEOUS | 3 refills | Status: DC

## 2022-02-24 NOTE — Telephone Encounter (Signed)
Called and was advised to fax order to [phone] order sent with demo sheet. Advised to call with any questions.  ?

## 2022-02-24 NOTE — Telephone Encounter (Signed)
Pt needs HHN set up for three times a week dressing change to the RLE silvadene dressing s/p 5th ray amputation and ulceration anterior ankle and lateral ankle. Pt asked to call [phone] [facility] as she is established with this facility and there was no answer. Will hold and try to call again. ?

## 2022-02-25 ENCOUNTER — Encounter: Payer: Self-pay | Admitting: Orthopedic Surgery

## 2022-02-25 NOTE — Progress Notes (Signed)
? ?Office Visit Note ?  ?Patient: Amy Moses           ?Date of Birth: 1971/07/24           ?MRN: [id number] ?Visit Date: 02/24/2022 ?             ?Requested by: Emelda Fear, DO ?[address] [place] [address] ?PCP: Emelda Fear, DO ? ?Chief Complaint  ?Patient presents with  ? Right Foot - Routine Post Op  ?  02/16/22 right 5th ray amputation   ? ? ? ? ?HPI: ?Patient is a 51 year old woman who presents in follow-up 1 week status post right foot fifth ray amputation.  Patient has been treated as well for bilateral lower extremity calciphylaxis status post skin graft to both legs.  Patient is currently on doxycycline. ? ?Assessment & Plan: ?Visit Diagnoses:  ?1. Cutaneous abscess of right foot   ?2. Gangrene of right foot ([place])   ?3. Calciphylaxis of left lower extremity with nonhealing ulcer with necrosis of muscle ([place])   ?4. Calciphylaxis of right lower extremity with nonhealing ulcer, limited to breakdown of skin ([place])   ? ? ?Plan: Discussed with the patient she is having progressive ischemic changes to the right ankle, however she has had excellent healing of the right posterior calf.  Orders are written for home health nursing to change the right foot dressing 3 times a week with Silvadene. ? ?Follow-Up Instructions: Return in about 1 week (around 03/03/2022).  ? ?Ortho Exam ? ?Patient is alert, oriented, no adenopathy, well-dressed, normal affect, normal respiratory effort. ?Examination patient has 2 new necrotic ulcers over the medial and lateral malleolus of the right ankle.  The ischemic ulcer over the dorsum of the ankle is worse.  There is bleeding from the fifth ray amputation site but no good granulation tissue from the wound VAC.  The left calf ulcer continues to decrease in size and has 100% granulation tissue.  The right calf ulcer is essentially healed. ? ?Imaging: ?No results found. ? ? ? ? ? ? ? ? ?Labs: ?Lab Results  ?Component Value Date  ? HGBA1C 11.5 (H) 02/15/2022  ? HGBA1C  12.2 (H) 12/10/2021  ? HGBA1C 9.5 (H) 08/07/2021  ? ESRSEDRATE 34 (H) 02/14/2022  ? ESRSEDRATE 40 (H) 08/07/2021  ? CRP 7.0 (H) 02/14/2022  ? CRP 11.1 (H) 08/07/2021  ? REPTSTATUS 01/29/2022 FINAL 01/24/2022  ? GRAMSTAIN  07/08/2021  ?  RARE WBC PRESENT,BOTH PMN AND MONONUCLEAR ?RARE GRAM POSITIVE RODS ?RARE GRAM POSITIVE COCCI IN PAIRS ?  ? CULT  01/24/2022  ?  NO GROWTH 5 DAYS ?Performed at [facility], [place] [address], [place], [place] [address] ?  ? LABORGA CITROBACTER KOSERI 05/21/2021  ? [place] 05/21/2021  ? ? ? ?Lab Results  ?Component Value Date  ? ALBUMIN 2.7 (L) 02/18/2022  ? ALBUMIN 2.4 (L) 02/17/2022  ? ALBUMIN 2.5 (L) 02/16/2022  ? PREALBUMIN 13.7 (L) 08/07/2021  ? ? ?Lab Results  ?Component Value Date  ? MG 2.1 05/14/2021  ? MG 2.8 (H) 05/11/2021  ? MG 2.3 04/23/2021  ? ?No results found for: VD25OH ? ?Lab Results  ?Component Value Date  ? PREALBUMIN 13.7 (L) 08/07/2021  ? ? ?  Latest Ref Rng & Units 02/18/2022  ?  3:17 AM 02/17/2022  ?  2:02 AM 02/15/2022  ?  6:41 AM  ?CBC EXTENDED  ?WBC 4.0 - 10.5 K/uL 8.1   7.9   7.1    ?  RBC 3.87 - 5.11 MIL/uL 3.53   3.68   4.02    ?Hemoglobin 12.0 - 15.0 g/dL 9.6   10.1   11.0    ?HCT 36.0 - 46.0 % 31.1   32.3   35.0    ?Platelets 150 - 400 K/uL 206   209   227    ?NEUT# 1.7 - 7.7 K/uL  5.5     ?Lymph# 0.7 - 4.0 K/uL  1.2     ? ? ? ?There is no height or weight on file to calculate BMI. ? ?Orders:  ?No orders of the defined types were placed in this encounter. ? ?Meds ordered this encounter  ?Medications  ? silver sulfADIAZINE (SILVADENE) 1 % cream  ?  Sig: Apply 1 application. topically daily. Apply to affected area daily plus dry dressing  ?  Dispense:  400 g  ?  Refill:  3  ? ? ? Procedures: ?No procedures performed ? ?Clinical Data: ?No additional findings. ? ?ROS: ? ?All other systems negative, except as noted in the HPI. ?Review of Systems ? ?Objective: ?Vital Signs: LMP 08/22/2015 (Approximate)  ? ?Specialty Comments:  ?No specialty  comments available. ? ?PMFS History: ?Patient Active Problem List  ? Diagnosis Date Noted  ? Cutaneous abscess of right foot   ? Osteomyelitis of foot ([place]) 02/14/2022  ? Obesity (BMI 30-39.9) 01/26/2022  ? Ischemic ulcer of right foot ([place]) 01/24/2022  ? Normocytic anemia 01/24/2022  ? Sacral pressure ulcer 01/24/2022  ? Metabolic acidosis, increased anion gap 01/24/2022  ? Leg wound, left, sequela 12/10/2021  ? Open leg wound 09/03/2021  ? Wound infection   ? Non-pressure chronic ulcer of right calf limited to breakdown of skin ([place])   ? Calciphylaxis of right lower extremity with nonhealing ulcer, limited to breakdown of skin ([place])   ? Chronic ulcer of left thigh ([place]) 08/07/2021  ? Calciphylaxis of left lower extremity with nonhealing ulcer with necrosis of muscle ([place]) 08/07/2021  ? Lower GI bleed 05/12/2021  ? Acute GI bleeding 04/24/2021  ? Acute blood loss anemia 04/23/2021  ? GI bleed 04/22/2021  ? Fever 03/31/2021  ? Acute on chronic heart failure with preserved ejection fraction (HFpEF) ([place]) 03/30/2021  ? End stage renal disease ([place]) 11/16/2020  ? Calciphylaxis 11/06/2020  ? Non-healing open wound of heel 11/03/2020  ? Diabetic foot infection ([place]) 11/01/2020  ? Decubitus ulcer, heel 11/01/2020  ? Closed nondisplaced fracture of left patella 10/29/2020  ? Metabolic acidosis 98/92/1194  ? Acute on chronic renal failure ([place]) 06/10/2020  ? Acute pericardial effusion 06/10/2020  ? Chronic kidney disease, stage 4 (severe) ([place]) 03/05/2019  ? Vitamin D deficiency 01/28/2019  ? Uncontrolled type 2 diabetes mellitus with hyperglycemia, with long-term current use of insulin ([place]) 09/21/2015  ? Hyperlipidemia 09/21/2015  ? Essential hypertension, benign 09/21/2015  ? Primary hypothyroidism 09/21/2015  ? Iris bomb? 07/31/2012  ? Secondary angle-closure glaucoma 07/31/2012  ? ?Past Medical History:  ?Diagnosis Date  ? Anemia   ? Blindness of right eye with low vision in contralateral eye   ? s/p victrectomy  ?  Diabetes mellitus, type II ([place])   ? Dyslipidemia   ? Glaucoma   ? History of blood transfusion   ? Hypertension   ? Hypothyroidism (acquired)   ? Kidney disease   ? Stage 5  ? Pneumonia   ?  ?Family History  ?Problem Relation Age of Onset  ? Heart disease Mother   ? Diabetes Mother   ?  Kidney disease Mother   ? Diabetes Father   ? Heart disease Father   ? Diabetes Brother   ? Colon cancer Neg Hx   ?  ?Past Surgical History:  ?Procedure Laterality Date  ? ABDOMINAL AORTOGRAM W/LOWER EXTREMITY Bilateral 12/18/2020  ? Procedure: ABDOMINAL AORTOGRAM W/LOWER EXTREMITY;  Surgeon: Elam Dutch, MD;  Location: [facility];  Service: Cardiovascular;  Laterality: Bilateral;  ? ABDOMINAL AORTOGRAM W/LOWER EXTREMITY Bilateral 01/25/2022  ? Procedure: ABDOMINAL AORTOGRAM W/LOWER EXTREMITY;  Surgeon: Serafina Mitchell, MD;  Location: [facility];  Service: Cardiovascular;  Laterality: Bilateral;  ? AMPUTATION Right 02/16/2022  ? Procedure: RIGHT FOOT 5TH RAY AMPUTATION;  Surgeon: Newt Minion, MD;  Location: [facility];  Service: Orthopedics;  Laterality: Right;  ? ANKLE FRACTURE SURGERY Right   ? AV FISTULA PLACEMENT Left 08/18/2020  ? Procedure: LEFT ARM BRACHIOCEPHALIC ARTERIOVENOUS (AV) FISTULA CREATION;  Surgeon: Elam Dutch, MD;  Location: [facility];  Service: Vascular;  Laterality: Left;  ? BIOPSY  04/24/2021  ? Procedure: BIOPSY;  Surgeon: Eloise Harman, DO;  [facility];  Service: Endoscopy;;  ? CESAREAN SECTION    ? CHOLECYSTECTOMY    ? COLONOSCOPY  04/24/2021  ? Surgeon: Eloise Harman, DO;  nonbleeding internal hemorrhoids, 1 large (25 mm) pedunculated transverse colon polyp (prolapse type polyp) with adherent clot and stigmata of recent bleed.  ? COLONOSCOPY WITH PROPOFOL N/A 05/14/2021  ? Procedure: COLONOSCOPY WITH PROPOFOL;  Surgeon: Daneil Dolin, MD;  [facility];  Service: Endoscopy;  Laterality: N/A;  ? ESOPHAGOGASTRODUODENOSCOPY (EGD) WITH PROPOFOL N/A  04/24/2021  ? Surgeon: Eloise Harman, DO;  duodenal erosions and gastritis biopsied (pathology with peptic duodenitis, reactive gastropathy with erosions/chronic inflammation, negative for H. pylori)  ? EYE SURGERY

## 2022-02-28 ENCOUNTER — Encounter: Payer: Medicare Other | Admitting: Orthopedic Surgery

## 2022-02-28 ENCOUNTER — Encounter: Payer: Self-pay | Admitting: Orthopedic Surgery

## 2022-02-28 ENCOUNTER — Ambulatory Visit (INDEPENDENT_AMBULATORY_CARE_PROVIDER_SITE_OTHER): Payer: Medicare Other | Admitting: Orthopedic Surgery

## 2022-02-28 DIAGNOSIS — L97923 Non-pressure chronic ulcer of unspecified part of left lower leg with necrosis of muscle: Secondary | ICD-10-CM | POA: Diagnosis not present

## 2022-02-28 DIAGNOSIS — L02611 Cutaneous abscess of right foot: Secondary | ICD-10-CM | POA: Diagnosis not present

## 2022-02-28 DIAGNOSIS — I96 Gangrene, not elsewhere classified: Secondary | ICD-10-CM

## 2022-02-28 DIAGNOSIS — L97911 Non-pressure chronic ulcer of unspecified part of right lower leg limited to breakdown of skin: Secondary | ICD-10-CM

## 2022-02-28 NOTE — Progress Notes (Signed)
? ?Office Visit Note ?  ?Patient: Amy Moses           ?Date of Birth: [birth year]           ?MRN: [id number] ?Visit Date: 02/28/2022 ?             ?Requested by: Emelda Fear, DO ?[address] ?[place] [address] ?PCP: Emelda Fear, DO ? ?Chief Complaint  ?Patient presents with  ? Right Foot - Routine Post Op  ?  02/16/22 right 5th ray amputation   ? Left Leg - Follow-up  ? ? ? ? ?HPI: ?Patient is a 51 year old woman who presents with multiple medical conditions.  She is status post right foot fifth ray amputation approximately 2 weeks ago.  She has been doing Silvadene dressing changes.  She continues with the Dynaflex wraps to the left lower extremity. ? ?Assessment & Plan: ?Visit Diagnoses:  ?1. Cutaneous abscess of right foot   ?2. Gangrene of right foot ([place])   ?3. Calciphylaxis of left lower extremity with nonhealing ulcer with necrosis of muscle ([place])   ?4. Calciphylaxis of right lower extremity with nonhealing ulcer, limited to breakdown of skin ([place])   ? ? ?Plan: Plan for home health nursing to proceed with Silvadene dressing changes Wednesday and Friday to the lateral right foot wound as well as the 3 ischemic ulcers over the ankle and an Ace wrap to the lower extremity the other day she will proceed with soap and water and dry dressing changes.  A Dynaflex wrap applied to the left lower extremity. ? ?Anticipate applying Kerecis tissue graft to the wound right foot. ? ?Follow-Up Instructions: Return in about 1 week (around 03/07/2022).  ? ?Ortho Exam ? ?Patient is alert, oriented, no adenopathy, well-dressed, normal affect, normal respiratory effort. ?Examination the left calf ulcer continues to decrease in size and has 100% healthy granulation tissue the wound is flat.  The right calf ulcer is essentially healed.  There is granulation tissue in the large lateral right foot wound no exposed bone or tendon.  There is maceration in the toes from drainage.  The ischemic ulcers over the medial and  lateral malleolus as well as dorsally over the ankle are stable. ? ?Imaging: ?No results found. ? ? ? ?Labs: ?Lab Results  ?Component Value Date  ? HGBA1C 11.5 (H) 02/15/2022  ? HGBA1C 12.2 (H) 12/10/2021  ? HGBA1C 9.5 (H) 08/07/2021  ? ESRSEDRATE 34 (H) 02/14/2022  ? ESRSEDRATE 40 (H) 08/07/2021  ? CRP 7.0 (H) 02/14/2022  ? CRP 11.1 (H) 08/07/2021  ? REPTSTATUS 01/29/2022 FINAL 01/24/2022  ? GRAMSTAIN  07/08/2021  ?  RARE WBC PRESENT,BOTH PMN AND MONONUCLEAR ?RARE GRAM POSITIVE RODS ?RARE GRAM POSITIVE COCCI IN PAIRS ?  ? CULT  01/24/2022  ?  NO GROWTH 5 DAYS ?Performed at [facility], [place] [address], [place], [place] [address] ?  ? LABORGA CITROBACTER KOSERI 05/21/2021  ? [place] 05/21/2021  ? ? ? ?Lab Results  ?Component Value Date  ? ALBUMIN 2.7 (L) 02/18/2022  ? ALBUMIN 2.4 (L) 02/17/2022  ? ALBUMIN 2.5 (L) 02/16/2022  ? PREALBUMIN 13.7 (L) 08/07/2021  ? ? ?Lab Results  ?Component Value Date  ? MG 2.1 05/14/2021  ? MG 2.8 (H) 05/11/2021  ? MG 2.3 04/23/2021  ? ?No results found for: VD25OH ? ?Lab Results  ?Component Value Date  ? PREALBUMIN 13.7 (L) 08/07/2021  ? ? ?  Latest Ref Rng & Units 02/18/2022  ?  3:17 AM 02/17/2022  ?  2:02 AM 02/15/2022  ?  6:41 AM  ?CBC EXTENDED  ?WBC 4.0 - 10.5 K/uL 8.1   7.9   7.1    ?RBC 3.87 - 5.11 MIL/uL 3.53   3.68   4.02    ?Hemoglobin 12.0 - 15.0 g/dL 9.6   10.1   11.0    ?HCT 36.0 - 46.0 % 31.1   32.3   35.0    ?Platelets 150 - 400 K/uL 206   209   227    ?NEUT# 1.7 - 7.7 K/uL  5.5     ?Lymph# 0.7 - 4.0 K/uL  1.2     ? ? ? ?There is no height or weight on file to calculate BMI. ? ?Orders:  ?No orders of the defined types were placed in this encounter. ? ?No orders of the defined types were placed in this encounter. ? ? ? Procedures: ?No procedures performed ? ?Clinical Data: ?No additional findings. ? ?ROS: ? ?All other systems negative, except as noted in the HPI. ?Review of Systems ? ?Objective: ?Vital Signs: LMP 08/22/2015 (Approximate)  ? ?Specialty  Comments:  ?No specialty comments available. ? ?PMFS History: ?Patient Active Problem List  ? Diagnosis Date Noted  ? Cutaneous abscess of right foot   ? Osteomyelitis of foot ([place]) 02/14/2022  ? Obesity (BMI 30-39.9) 01/26/2022  ? Ischemic ulcer of right foot ([place]) 01/24/2022  ? Normocytic anemia 01/24/2022  ? Sacral pressure ulcer 01/24/2022  ? Metabolic acidosis, increased anion gap 01/24/2022  ? Leg wound, left, sequela 12/10/2021  ? Open leg wound 09/03/2021  ? Wound infection   ? Non-pressure chronic ulcer of right calf limited to breakdown of skin ([place])   ? Calciphylaxis of right lower extremity with nonhealing ulcer, limited to breakdown of skin ([place])   ? Chronic ulcer of left thigh ([place]) 08/07/2021  ? Calciphylaxis of left lower extremity with nonhealing ulcer with necrosis of muscle ([place]) 08/07/2021  ? Lower GI bleed 05/12/2021  ? Acute GI bleeding 04/24/2021  ? Acute blood loss anemia 04/23/2021  ? GI bleed 04/22/2021  ? Fever 03/31/2021  ? Acute on chronic heart failure with preserved ejection fraction (HFpEF) ([place]) 03/30/2021  ? End stage renal disease ([place]) 11/16/2020  ? Calciphylaxis 11/06/2020  ? Non-healing open wound of heel 11/03/2020  ? Diabetic foot infection ([place]) 11/01/2020  ? Decubitus ulcer, heel 11/01/2020  ? Closed nondisplaced fracture of left patella 10/29/2020  ? Metabolic acidosis 95/63/8756  ? Acute on chronic renal failure ([place]) 06/10/2020  ? Acute pericardial effusion 06/10/2020  ? Chronic kidney disease, stage 4 (severe) ([employer]) 03/05/2019  ? Vitamin D deficiency 01/28/2019  ? Uncontrolled type 2 diabetes mellitus with hyperglycemia, with long-term current use of insulin ([place]) 09/21/2015  ? Hyperlipidemia 09/21/2015  ? Essential hypertension, benign 09/21/2015  ? Primary hypothyroidism 09/21/2015  ? Iris bomb? 07/31/2012  ? Secondary angle-closure glaucoma 07/31/2012  ? ?Past Medical History:  ?Diagnosis Date  ? Anemia   ? Blindness of right eye with low vision in contralateral eye    ? s/p victrectomy  ? Diabetes mellitus, type II ([place])   ? Dyslipidemia   ? Glaucoma   ? History of blood transfusion   ? Hypertension   ? Hypothyroidism (acquired)   ? Kidney disease   ? Stage 5  ? Pneumonia   ?  ?Family History  ?Problem Relation Age of Onset  ? Heart disease Mother   ? Diabetes Mother   ? Kidney  disease Mother   ? Diabetes Father   ? Heart disease Father   ? Diabetes Brother   ? Colon cancer Neg Hx   ?  ?Past Surgical History:  ?Procedure Laterality Date  ? ABDOMINAL AORTOGRAM W/LOWER EXTREMITY Bilateral 12/18/2020  ? Procedure: ABDOMINAL AORTOGRAM W/LOWER EXTREMITY;  Surgeon: Elam Dutch, MD;  Location: [facility];  Service: Cardiovascular;  Laterality: Bilateral;  ? ABDOMINAL AORTOGRAM W/LOWER EXTREMITY Bilateral 01/25/2022  ? Procedure: ABDOMINAL AORTOGRAM W/LOWER EXTREMITY;  Surgeon: Serafina Mitchell, MD;  Location: [facility];  Service: Cardiovascular;  Laterality: Bilateral;  ? AMPUTATION Right 02/16/2022  ? Procedure: RIGHT FOOT 5TH RAY AMPUTATION;  Surgeon: Newt Minion, MD;  Location: [facility];  Service: Orthopedics;  Laterality: Right;  ? ANKLE FRACTURE SURGERY Right   ? AV FISTULA PLACEMENT Left 08/18/2020  ? Procedure: LEFT ARM BRACHIOCEPHALIC ARTERIOVENOUS (AV) FISTULA CREATION;  Surgeon: Elam Dutch, MD;  Location: [facility];  Service: Vascular;  Laterality: Left;  ? BIOPSY  04/24/2021  ? Procedure: BIOPSY;  Surgeon: Eloise Harman, DO;  [facility];  Service: Endoscopy;;  ? CESAREAN SECTION    ? CHOLECYSTECTOMY    ? COLONOSCOPY  04/24/2021  ? Surgeon: Eloise Harman, DO;  nonbleeding internal hemorrhoids, 1 large (25 mm) pedunculated transverse colon polyp (prolapse type polyp) with adherent clot and stigmata of recent bleed.  ? COLONOSCOPY WITH PROPOFOL N/A 05/14/2021  ? Procedure: COLONOSCOPY WITH PROPOFOL;  Surgeon: Daneil Dolin, MD;  [facility];  Service: Endoscopy;  Laterality: N/A;  ? ESOPHAGOGASTRODUODENOSCOPY (EGD) WITH  PROPOFOL N/A 04/24/2021  ? Surgeon: Eloise Harman, DO;  duodenal erosions and gastritis biopsied (pathology with peptic duodenitis, reactive gastropathy with erosions/chronic inflammation, negati

## 2022-03-02 ENCOUNTER — Telehealth: Payer: Self-pay | Admitting: Orthopedic Surgery

## 2022-03-02 NOTE — Telephone Encounter (Signed)
[employer] forms received for patients daughter. To [employer] ?

## 2022-03-03 ENCOUNTER — Telehealth: Payer: Self-pay | Admitting: Orthopedic Surgery

## 2022-03-03 NOTE — Telephone Encounter (Signed)
Amy Moses called and was asking for a ICD10 code? Please call her back at [phone] ?

## 2022-03-04 ENCOUNTER — Telehealth: Payer: Self-pay | Admitting: Orthopedic Surgery

## 2022-03-04 NOTE — Telephone Encounter (Signed)
02/28/22 ov note faxed to [employer] [phone] ?

## 2022-03-07 ENCOUNTER — Ambulatory Visit (INDEPENDENT_AMBULATORY_CARE_PROVIDER_SITE_OTHER): Payer: Medicare Other | Admitting: Orthopedic Surgery

## 2022-03-07 DIAGNOSIS — L97923 Non-pressure chronic ulcer of unspecified part of left lower leg with necrosis of muscle: Secondary | ICD-10-CM | POA: Diagnosis not present

## 2022-03-07 DIAGNOSIS — L97911 Non-pressure chronic ulcer of unspecified part of right lower leg limited to breakdown of skin: Secondary | ICD-10-CM

## 2022-03-07 NOTE — Telephone Encounter (Signed)
Called [facility] back and provided ICD 10 codes R26 and M62.81 for wheelchair. ?

## 2022-03-08 ENCOUNTER — Telehealth: Payer: Self-pay | Admitting: Orthopedic Surgery

## 2022-03-08 NOTE — Telephone Encounter (Signed)
I checked on this form. It is pending signature from Dr. [name]. It is in his folder to sign. ?

## 2022-03-08 NOTE — Telephone Encounter (Signed)
Juliann Pulse rep for [facility] calling to get paperwork faxed over to her. Stated it is paperwork on a wheelchair authorization for the pt. The best fax number is [phone].   ?

## 2022-03-14 ENCOUNTER — Ambulatory Visit (INDEPENDENT_AMBULATORY_CARE_PROVIDER_SITE_OTHER): Payer: Medicare Other | Admitting: Orthopedic Surgery

## 2022-03-14 ENCOUNTER — Encounter: Payer: Self-pay | Admitting: Orthopedic Surgery

## 2022-03-14 DIAGNOSIS — L97911 Non-pressure chronic ulcer of unspecified part of right lower leg limited to breakdown of skin: Secondary | ICD-10-CM

## 2022-03-14 DIAGNOSIS — I739 Peripheral vascular disease, unspecified: Secondary | ICD-10-CM

## 2022-03-14 DIAGNOSIS — L97923 Non-pressure chronic ulcer of unspecified part of left lower leg with necrosis of muscle: Secondary | ICD-10-CM

## 2022-03-14 DIAGNOSIS — L98491 Non-pressure chronic ulcer of skin of other sites limited to breakdown of skin: Secondary | ICD-10-CM

## 2022-03-14 DIAGNOSIS — L02611 Cutaneous abscess of right foot: Secondary | ICD-10-CM

## 2022-03-14 NOTE — Progress Notes (Signed)
? ?Office Visit Note ?  ?Patient: Amy Moses           ?Date of Birth: 04-13-1971           ?MRN: [id number] ?Visit Date: 03/14/2022 ?             ?Requested by: Emelda Fear, DO ?[address] [place] [address] ?PCP: Emelda Fear, DO ? ?Chief Complaint  ?Patient presents with  ? Right Leg - Wound Check  ? Left Leg - Wound Check  ? ? ? ? ?HPI: ?Patient is a 51 year old woman who presents 4 weeks status post right foot fifth ray amputation.  The wound dehiscence is showing interval granulation.  Patient is currently doing Silvadene dressing changes Wednesday and Friday with home health nursing she is doing dressing changes the other days and serial compression Dynaflex wraps to the left lower extremities.  Patient's doxycycline has been continued by her dermatologic office for an additional 30 days secondary to acne. ? ?Assessment & Plan: ?Visit Diagnoses:  ?1. Cutaneous abscess of right foot   ?2. Calciphylaxis of left lower extremity with nonhealing ulcer with necrosis of muscle ([place])   ?3. Calciphylaxis of right lower extremity with nonhealing ulcer, limited to breakdown of skin ([place])   ?4. PVD (peripheral vascular disease) ([place])   ?5. Ischemic ulcer, limited to breakdown of skin ([place])   ? ? ?Plan: Continue with current wound care follow-up in a week. ? ?Follow-Up Instructions: Return in about 1 week (around 03/21/2022).  ? ?Ortho Exam ? ?Patient is alert, oriented, no adenopathy, well-dressed, normal affect, normal respiratory effort. ?Examination patient has a left ischial tuberosity decubitus ulcer that is 3 cm in diameter currently using a Mepilex dressing.  Patient will need to continue using her air mattress to unload this ulcer.  The left calf shows interval improvement with the 100% granulation tissue the wound measures 2 x 9 cm.  We will continue with the Dynaflex wrap on the left.  Right lateral wound dehiscence shows improved granulation tissue approximately 75% there is no exposed bone or  tendon no cellulitis no purulence.  The ulcers over the dorsum of the ankle x2 have scabs. ? ?Imaging: ?No results found. ?No images are attached to the encounter. ? ?Labs: ?Lab Results  ?Component Value Date  ? HGBA1C 11.5 (H) 02/15/2022  ? HGBA1C 12.2 (H) 12/10/2021  ? HGBA1C 9.5 (H) 08/07/2021  ? ESRSEDRATE 34 (H) 02/14/2022  ? ESRSEDRATE 40 (H) 08/07/2021  ? CRP 7.0 (H) 02/14/2022  ? CRP 11.1 (H) 08/07/2021  ? REPTSTATUS 01/29/2022 FINAL 01/24/2022  ? GRAMSTAIN  07/08/2021  ?  RARE WBC PRESENT,BOTH PMN AND MONONUCLEAR ?RARE GRAM POSITIVE RODS ?RARE GRAM POSITIVE COCCI IN PAIRS ?  ? CULT  01/24/2022  ?  NO GROWTH 5 DAYS ?Performed at [facility], [place] [address] [place], [place] [address] ?  ? LABORGA CITROBACTER KOSERI 05/21/2021  ? [place] 05/21/2021  ? ? ? ?Lab Results  ?Component Value Date  ? ALBUMIN 2.7 (L) 02/18/2022  ? ALBUMIN 2.4 (L) 02/17/2022  ? ALBUMIN 2.5 (L) 02/16/2022  ? PREALBUMIN 13.7 (L) 08/07/2021  ? ? ?Lab Results  ?Component Value Date  ? MG 2.1 05/14/2021  ? MG 2.8 (H) 05/11/2021  ? MG 2.3 04/23/2021  ? ?No results found for: VD25OH ? ?Lab Results  ?Component Value Date  ? PREALBUMIN 13.7 (L) 08/07/2021  ? ? ?  Latest Ref Rng & Units 02/18/2022  ?  3:17 AM  02/17/2022  ?  2:02 AM 02/15/2022  ?  6:41 AM  ?CBC EXTENDED  ?WBC 4.0 - 10.5 K/uL 8.1   7.9   7.1    ?RBC 3.87 - 5.11 MIL/uL 3.53   3.68   4.02    ?Hemoglobin 12.0 - 15.0 g/dL 9.6   10.1   11.0    ?HCT 36.0 - 46.0 % 31.1   32.3   35.0    ?Platelets 150 - 400 K/uL 206   209   227    ?NEUT# 1.7 - 7.7 K/uL  5.5     ?Lymph# 0.7 - 4.0 K/uL  1.2     ? ? ? ?There is no height or weight on file to calculate BMI. ? ?Orders:  ?No orders of the defined types were placed in this encounter. ? ?No orders of the defined types were placed in this encounter. ? ? ? Procedures: ?No procedures performed ? ?Clinical Data: ?No additional findings. ? ?ROS: ? ?All other systems negative, except as noted in the HPI. ?Review of  Systems ? ?Objective: ?Vital Signs: LMP 08/22/2015 (Approximate)  ? ?Specialty Comments:  ?No specialty comments available. ? ?PMFS History: ?Patient Active Problem List  ? Diagnosis Date Noted  ? Cutaneous abscess of right foot   ? Osteomyelitis of foot ([place]) 02/14/2022  ? Obesity (BMI 30-39.9) 01/26/2022  ? Ischemic ulcer of right foot ([place]) 01/24/2022  ? Normocytic anemia 01/24/2022  ? Sacral pressure ulcer 01/24/2022  ? Metabolic acidosis, increased anion gap 01/24/2022  ? Leg wound, left, sequela 12/10/2021  ? Open leg wound 09/03/2021  ? Wound infection   ? Non-pressure chronic ulcer of right calf limited to breakdown of skin ([place])   ? Calciphylaxis of right lower extremity with nonhealing ulcer, limited to breakdown of skin ([place])   ? Chronic ulcer of left thigh ([place]) 08/07/2021  ? Calciphylaxis of left lower extremity with nonhealing ulcer with necrosis of muscle ([place]) 08/07/2021  ? Lower GI bleed 05/12/2021  ? Acute GI bleeding 04/24/2021  ? Acute blood loss anemia 04/23/2021  ? GI bleed 04/22/2021  ? Fever 03/31/2021  ? Acute on chronic heart failure with preserved ejection fraction (HFpEF) ([place]) 03/30/2021  ? End stage renal disease ([place]) 11/16/2020  ? Calciphylaxis 11/06/2020  ? Non-healing open wound of heel 11/03/2020  ? Diabetic foot infection ([place]) 11/01/2020  ? Decubitus ulcer, heel 11/01/2020  ? Closed nondisplaced fracture of left patella 10/29/2020  ? Metabolic acidosis 71/04/2693  ? Acute on chronic renal failure ([place]) 06/10/2020  ? Acute pericardial effusion 06/10/2020  ? Chronic kidney disease, stage 4 (severe) ([place]) 03/05/2019  ? Vitamin D deficiency 01/28/2019  ? Uncontrolled type 2 diabetes mellitus with hyperglycemia, with long-term current use of insulin ([place]) 09/21/2015  ? Hyperlipidemia 09/21/2015  ? Essential hypertension, benign 09/21/2015  ? Primary hypothyroidism 09/21/2015  ? Iris bomb? 07/31/2012  ? Secondary angle-closure glaucoma 07/31/2012  ? ?Past Medical History:  ?Diagnosis  Date  ? Anemia   ? Blindness of right eye with low vision in contralateral eye   ? s/p victrectomy  ? Diabetes mellitus, type II ([place])   ? Dyslipidemia   ? Glaucoma   ? History of blood transfusion   ? Hypertension   ? Hypothyroidism (acquired)   ? Kidney disease   ? Stage 5  ? Pneumonia   ?  ?Family History  ?Problem Relation Age of Onset  ? Heart disease Mother   ? Diabetes Mother   ? Kidney disease Mother   ?  Diabetes Father   ? Heart disease Father   ? Diabetes Brother   ? Colon cancer Neg Hx   ?  ?Past Surgical History:  ?Procedure Laterality Date  ? ABDOMINAL AORTOGRAM W/LOWER EXTREMITY Bilateral 12/18/2020  ? Procedure: ABDOMINAL AORTOGRAM W/LOWER EXTREMITY;  Surgeon: Elam Dutch, MD;  Location: [facility];  Service: Cardiovascular;  Laterality: Bilateral;  ? ABDOMINAL AORTOGRAM W/LOWER EXTREMITY Bilateral 01/25/2022  ? Procedure: ABDOMINAL AORTOGRAM W/LOWER EXTREMITY;  Surgeon: Serafina Mitchell, MD;  Location: [facility];  Service: Cardiovascular;  Laterality: Bilateral;  ? AMPUTATION Right 02/16/2022  ? Procedure: RIGHT FOOT 5TH RAY AMPUTATION;  Surgeon: Newt Minion, MD;  Location: [facility];  Service: Orthopedics;  Laterality: Right;  ? ANKLE FRACTURE SURGERY Right   ? AV FISTULA PLACEMENT Left 08/18/2020  ? Procedure: LEFT ARM BRACHIOCEPHALIC ARTERIOVENOUS (AV) FISTULA CREATION;  Surgeon: Elam Dutch, MD;  Location: [facility];  Service: Vascular;  Laterality: Left;  ? BIOPSY  04/24/2021  ? Procedure: BIOPSY;  Surgeon: Eloise Harman, DO;  [facility];  Service: Endoscopy;;  ? CESAREAN SECTION    ? CHOLECYSTECTOMY    ? COLONOSCOPY  04/24/2021  ? Surgeon: Eloise Harman, DO;  nonbleeding internal hemorrhoids, 1 large (25 mm) pedunculated transverse colon polyp (prolapse type polyp) with adherent clot and stigmata of recent bleed.  ? COLONOSCOPY WITH PROPOFOL N/A 05/14/2021  ? Procedure: COLONOSCOPY WITH PROPOFOL;  Surgeon: Daneil Dolin, MD;  [facility];   Service: Endoscopy;  Laterality: N/A;  ? ESOPHAGOGASTRODUODENOSCOPY (EGD) WITH PROPOFOL N/A 04/24/2021  ? Surgeon: Eloise Harman, DO;  duodenal erosions and gastritis biopsied (pathology with peptic duodenitis,

## 2022-03-16 ENCOUNTER — Ambulatory Visit (INDEPENDENT_AMBULATORY_CARE_PROVIDER_SITE_OTHER): Payer: Medicare Other | Admitting: "Endocrinology

## 2022-03-16 ENCOUNTER — Encounter: Payer: Self-pay | Admitting: "Endocrinology

## 2022-03-16 ENCOUNTER — Telehealth: Payer: Self-pay | Admitting: Orthopedic Surgery

## 2022-03-16 VITALS — BP 124/80 | HR 80 | Ht 66.0 in

## 2022-03-16 DIAGNOSIS — E1122 Type 2 diabetes mellitus with diabetic chronic kidney disease: Secondary | ICD-10-CM

## 2022-03-16 DIAGNOSIS — Z91199 Patient's noncompliance with other medical treatment and regimen due to unspecified reason: Secondary | ICD-10-CM

## 2022-03-16 DIAGNOSIS — N186 End stage renal disease: Secondary | ICD-10-CM | POA: Diagnosis not present

## 2022-03-16 DIAGNOSIS — E559 Vitamin D deficiency, unspecified: Secondary | ICD-10-CM

## 2022-03-16 DIAGNOSIS — E039 Hypothyroidism, unspecified: Secondary | ICD-10-CM | POA: Diagnosis not present

## 2022-03-16 DIAGNOSIS — I10 Essential (primary) hypertension: Secondary | ICD-10-CM | POA: Diagnosis not present

## 2022-03-16 MED ORDER — INSULIN GLARGINE 100 UNIT/ML ~~LOC~~ SOLN: 15.0000 [IU] | Freq: Every day | SUBCUTANEOUS | 1 refills | Status: DC

## 2022-03-16 MED ORDER — ACCU-CHEK GUIDE ME W/DEVICE KIT: 1.0000 | PACK | 0 refills | Status: DC

## 2022-03-16 MED ORDER — HUMALOG KWIKPEN 100 UNIT/ML ~~LOC~~ SOPN
5.0000 [IU] | PEN_INJECTOR | Freq: Three times a day (TID) | SUBCUTANEOUS | 1 refills | Status: DC
Start: 2022-03-16 — End: 2024-04-21

## 2022-03-16 MED ORDER — ACCU-CHEK GUIDE VI STRP: ORAL_STRIP | 2 refills | Status: DC

## 2022-03-16 NOTE — Telephone Encounter (Signed)
03/14/22 ov note faxed to [employer] [phone] ?

## 2022-03-16 NOTE — Progress Notes (Signed)
? ?                                                             Endocrinology Consult Note  ?     03/16/2022, 3:26 PM ? ? ?Subjective:  ? ? Patient ID: Amy Moses, female    DOB: [birth year].  ?Amy Moses is being seen in consultation for management of currently uncontrolled symptomatic diabetes requested by  Emelda Fear, DO. ? ? ?Past Medical History:  ?Diagnosis Date  ? Anemia   ? Blindness of right eye with low vision in contralateral eye   ? s/p victrectomy  ? Diabetes mellitus, type II ([place])   ? Dyslipidemia   ? Glaucoma   ? History of blood transfusion   ? Hypertension   ? Hypothyroidism (acquired)   ? Kidney disease   ? Stage 5  ? Pneumonia   ? ? ?Past Surgical History:  ?Procedure Laterality Date  ? ABDOMINAL AORTOGRAM W/LOWER EXTREMITY Bilateral 12/18/2020  ? Procedure: ABDOMINAL AORTOGRAM W/LOWER EXTREMITY;  Surgeon: Elam Dutch, MD;  Location: [facility];  Service: Cardiovascular;  Laterality: Bilateral;  ? ABDOMINAL AORTOGRAM W/LOWER EXTREMITY Bilateral 01/25/2022  ? Procedure: ABDOMINAL AORTOGRAM W/LOWER EXTREMITY;  Surgeon: Serafina Mitchell, MD;  Location: [facility];  Service: Cardiovascular;  Laterality: Bilateral;  ? AMPUTATION Right 02/16/2022  ? Procedure: RIGHT FOOT 5TH RAY AMPUTATION;  Surgeon: Newt Minion, MD;  Location: [facility];  Service: Orthopedics;  Laterality: Right;  ? ANKLE FRACTURE SURGERY Right   ? AV FISTULA PLACEMENT Left 08/18/2020  ? Procedure: LEFT ARM BRACHIOCEPHALIC ARTERIOVENOUS (AV) FISTULA CREATION;  Surgeon: Elam Dutch, MD;  Location: [facility];  Service: Vascular;  Laterality: Left;  ? BIOPSY  04/24/2021  ? Procedure: BIOPSY;  Surgeon: Eloise Harman, DO;  [facility];  Service: Endoscopy;;  ? CESAREAN SECTION    ? CHOLECYSTECTOMY    ? COLONOSCOPY  04/24/2021  ? Surgeon: Eloise Harman, DO;  nonbleeding internal hemorrhoids, 1 large (25 mm) pedunculated transverse colon polyp (prolapse type polyp) with  adherent clot and stigmata of recent bleed.  ? COLONOSCOPY WITH PROPOFOL N/A 05/14/2021  ? Procedure: COLONOSCOPY WITH PROPOFOL;  Surgeon: Daneil Dolin, MD;  [facility];  Service: Endoscopy;  Laterality: N/A;  ? ESOPHAGOGASTRODUODENOSCOPY (EGD) WITH PROPOFOL N/A 04/24/2021  ? Surgeon: Eloise Harman, DO;  duodenal erosions and gastritis biopsied (pathology with peptic duodenitis, reactive gastropathy with erosions/chronic inflammation, negative for H. pylori)  ? EYE SURGERY    ? Vatrectomy  ? HEMOSTASIS CLIP PLACEMENT  05/14/2021  ? Procedure: HEMOSTASIS CLIP PLACEMENT;  Surgeon: Daneil Dolin, MD;  [facility];  Service: Endoscopy;;  ? IR PERC TUN PERIT CATH WO PORT S&I /IMAG  09/15/2020  ? IR REMOVAL TUN CV CATH W/O FL  02/19/2021  ? IR US GUIDE VASC ACCESS RIGHT  09/15/2020  ? POLYPECTOMY  04/24/2021  ? Procedure: POLYPECTOMY;  Surgeon: Eloise Harman, DO;  [facility];  Service: Endoscopy;;  ? POLYPECTOMY  05/14/2021  ? Procedure: POLYPECTOMY;  Surgeon: Daneil Dolin, MD;  [facility];  Service: Endoscopy;;  ? SKIN SPLIT GRAFT Bilateral 09/03/2021  ? Procedure: SKIN GRAFT BILATERAL LEGS;  Surgeon: Newt Minion, MD;  Location: [facility];  Service: Orthopedics;  Laterality: Bilateral;  ? SKIN SPLIT GRAFT Left 12/10/2021  ? Procedure: IRRIGATION AND DEBRIDEMENT LEFT CALF, APPLICATION SPLIT THICKNESS SKIN GRAFT;  Surgeon: Newt Minion, MD;  Location: [place] [facility];  Service: Orthopedics;  Laterality: Left;  ? TOE SURGERY    ? ? ?Social History  ? ?Socioeconomic History  ? Marital status: Single  ?  Spouse name: Not on file  ? Number of children: Not on file  ? Years of education: Not on file  ? Highest education level: Not on file  ?Occupational History  ? Not on file  ?Tobacco Use  ? Smoking status: Never  ? Smokeless tobacco: Never  ?Vaping Use  ? Vaping Use: Never used  ?Substance and Sexual Activity  ? Alcohol use: No  ? Drug use: No  ? Sexual activity: Yes  ?   Birth control/protection: Condom  ?Other Topics Concern  ? Not on file  ?Social History Narrative  ? Not on file  ? ?Social Determinants of Health  ? ?Financial Resource Strain: Not on file  ?Food Insecurity: Not on file  ?Transportation Needs: Not on file  ?Physical Activity: Not on file  ?Stress: Not on file  ?Social Connections: Not on file  ? ? ?Family History  ?Problem Relation Age of Onset  ? Heart disease Mother   ? Diabetes Mother   ? Kidney disease Mother   ? Diabetes Father   ? Heart disease Father   ? Diabetes Brother   ? Colon cancer Neg Hx   ? ? ?Outpatient Encounter Medications as of 03/16/2022  ?Medication Sig  ? Blood Glucose Monitoring Suppl (ACCU-CHEK GUIDE ME) w/Device KIT 1 Piece by Does not apply route as directed.  ? glucose blood (ACCU-CHEK GUIDE) test strip Use as instructed  ? acetaminophen (TYLENOL) 500 MG tablet Take 1,000 mg by mouth every 6 (six) hours as needed for moderate pain.  ? ascorbic acid (VITAMIN C) 1000 MG tablet Take 1 tablet (1,000 mg total) by mouth daily. (Patient not taking: Reported on 03/16/2022)  ? aspirin EC 81 MG tablet Take 1 tablet (81 mg total) by mouth daily with breakfast.  ? atorvastatin (LIPITOR) 10 MG tablet Take 1 tablet (10 mg total) by mouth daily. (Patient taking differently: Take 10 mg by mouth at bedtime.)  ? AURYXIA 1 GM 210 MG(Fe) tablet Take 420 mg by mouth 2 (two) times daily with a meal.  ? HUMALOG KWIKPEN 100 UNIT/ML KwikPen Inject 5-11 Units into the skin 3 (three) times daily with meals. If eats 50% or more of meal.  ? insulin glargine (LANTUS) 100 UNIT/ML injection Inject 0.15 mLs (15 Units total) into the skin at bedtime.  ? lactulose (CHRONULAC) 10 GM/15ML solution Take 10 g by mouth daily as needed for moderate constipation.  ? levothyroxine (SYNTHROID) 50 MCG tablet Take 1 tablet (50 mcg total) by mouth daily before breakfast.  ? pantoprazole (PROTONIX) 40 MG tablet Take 1 tablet (40 mg total) by mouth 2 (two) times daily.  ? pentoxifylline  (TRENTAL) 400 MG CR tablet Take 1 tablet (400 mg total) by mouth 3 (three) times daily with meals.  ? polyethylene glycol (MIRALAX / GLYCOLAX) 17 g packet Take 17 g by mouth daily as needed for mild constipation.  ? silver sulfADIAZINE (SILVADENE) 1 % cream Apply 1 application. topically daily. Apply to affected area daily plus dry dressing  ? torsemide (DEMADEX) 100 MG tablet Take 150 mg by mouth daily.   ? traMADol (  ULTRAM) 50 MG tablet Take 1 tablet (50 mg total) by mouth every 6 (six) hours as needed.  ? Vitamin D, Ergocalciferol, (DRISDOL) 1.25 MG (50000 UNIT) CAPS capsule Take 50,000 Units by mouth every 14 (fourteen) days.  ? [DISCONTINUED] HUMALOG KWIKPEN 100 UNIT/ML KwikPen Inject 2 Units into the skin 3 (three) times daily with meals. If eats 50% or more of meal. (Patient taking differently: Inject 5-15 Units into the skin 2 (two) times daily with a meal. Sliding Scale ?If eats 50% or more of meal.)  ? [DISCONTINUED] insulin glargine (LANTUS) 100 UNIT/ML injection Inject 0.08 mLs (8 Units total) into the skin at bedtime. (Patient taking differently: Inject 5 Units into the skin at bedtime.)  ? ?No facility-administered encounter medications on file as of 03/16/2022.  ? ? ?ALLERGIES: ?Allergies  ?Allergen Reactions  ? Ace Inhibitors Cough  ? ? ?VACCINATION STATUS: ?Immunization History  ?Administered Date(s) Administered  ? Hepatitis B, adult 11/09/2020  ? Influenza-Unspecified 10/03/2020  ? PPD Test 09/23/2020, 09/23/2020  ? Pneumococcal Polysaccharide-23 01/28/2019  ? Pneumococcal-Unspecified 05/30/2018  ? Tdap 06/04/2005, 10/21/2020  ? ? ?Diabetes ?She presents for her initial diabetic visit. She has type 2 diabetes mellitus. Onset time: This patient was diagnosed at approximate age of 81 years. Disease course: She was seen in 2016 for the same consult, failed to return for follow-up. There are no hypoglycemic associated symptoms. Pertinent negatives for hypoglycemia include no confusion, headaches,  pallor or seizures. Associated symptoms include blurred vision, fatigue and polydipsia. Pertinent negatives for diabetes include no chest pain, no polyphagia and no polyuria. There are no hypoglycemic compl

## 2022-03-16 NOTE — Patient Instructions (Signed)

## 2022-03-20 ENCOUNTER — Encounter: Payer: Self-pay | Admitting: Orthopedic Surgery

## 2022-03-20 NOTE — Progress Notes (Signed)
? ?Office Visit Note ?  ?Patient: Amy Moses           ?Date of Birth: [birth year]           ?MRN: [id number] ?Visit Date: 03/07/2022 ?             ?Requested by: Emelda Fear, DO ?[address] [place] [address] ?PCP: Emelda Fear, DO ? ?Chief Complaint  ?Patient presents with  ? Right Foot - Routine Post Op  ?  02/16/22 right 5th ray amputation  ? Left Leg - Follow-up  ?  12/10/21 I&D and STSG  ? ? ? ? ?HPI: ?Patient is a 51 year old woman with calciphylaxis of both lower extremities she has undergone debridement and biologic tissue grafting which has done well however she has had progressive ischemic changes to the right lower extremity status post a right foot fifth ray amputation 3 weeks ago.  She is currently doing Silvadene dressing changes to the right foot Dynaflex wrap to the left foot. ? ?Assessment & Plan: ?Visit Diagnoses:  ?1. Calciphylaxis of left lower extremity with nonhealing ulcer with necrosis of muscle ([place])   ?2. Calciphylaxis of right lower extremity with nonhealing ulcer, limited to breakdown of skin ([place])   ? ? ?Plan: Continue with current care with Silvadene dressing changes every other day to the right foot with Dial soap cleansing.  Dynaflex wrap to the left leg. ? ?Follow-Up Instructions: Return in about 1 week (around 03/14/2022).  ? ?Ortho Exam ? ?Patient is alert, oriented, no adenopathy, well-dressed, normal affect, normal respiratory effort. ?Examination the lateral aspect of the right foot has a 75% granulation tissue.  There is an ischemic ulcer dorsally over the ankle which is dry and scabbed there is no redness no drainage.  The left calf ulcer continues to improve and has 100% healthy granulation tissue in the wound is 9 x 2 cm. ? ?Imaging: ?No results found. ? ? ? ?Labs: ?Lab Results  ?Component Value Date  ? HGBA1C 11.5 (H) 02/15/2022  ? HGBA1C 13.0 12/30/2021  ? HGBA1C 12.2 (H) 12/10/2021  ? ESRSEDRATE 34 (H) 02/14/2022  ? ESRSEDRATE 40 (H) 08/07/2021  ? CRP 7.0  (H) 02/14/2022  ? CRP 11.1 (H) 08/07/2021  ? REPTSTATUS 01/29/2022 FINAL 01/24/2022  ? GRAMSTAIN  07/08/2021  ?  RARE WBC PRESENT,BOTH PMN AND MONONUCLEAR ?RARE GRAM POSITIVE RODS ?RARE GRAM POSITIVE COCCI IN PAIRS ?  ? CULT  01/24/2022  ?  NO GROWTH 5 DAYS ?Performed at [facility], [place] [address], [place], [place] [address] ?  ? LABORGA CITROBACTER KOSERI 05/21/2021  ? [place] 05/21/2021  ? ? ? ?Lab Results  ?Component Value Date  ? ALBUMIN 2.7 (L) 02/18/2022  ? ALBUMIN 2.4 (L) 02/17/2022  ? ALBUMIN 2.5 (L) 02/16/2022  ? PREALBUMIN 13.7 (L) 08/07/2021  ? ? ?Lab Results  ?Component Value Date  ? MG 2.1 05/14/2021  ? MG 2.8 (H) 05/11/2021  ? MG 2.3 04/23/2021  ? ?Lab Results  ?Component Value Date  ? VD25OH 36.9 12/30/2021  ? ? ?Lab Results  ?Component Value Date  ? PREALBUMIN 13.7 (L) 08/07/2021  ? ? ?  Latest Ref Rng & Units 02/18/2022  ?  3:17 AM 02/17/2022  ?  2:02 AM 02/15/2022  ?  6:41 AM  ?CBC EXTENDED  ?WBC 4.0 - 10.5 K/uL 8.1   7.9   7.1    ?RBC 3.87 - 5.11 MIL/uL 3.53   3.68   4.02    ?  Hemoglobin 12.0 - 15.0 g/dL 9.6   10.1   11.0    ?HCT 36.0 - 46.0 % 31.1   32.3   35.0    ?Platelets 150 - 400 K/uL 206   209   227    ?NEUT# 1.7 - 7.7 K/uL  5.5     ?Lymph# 0.7 - 4.0 K/uL  1.2     ? ? ? ?There is no height or weight on file to calculate BMI. ? ?Orders:  ?No orders of the defined types were placed in this encounter. ? ?No orders of the defined types were placed in this encounter. ? ? ? Procedures: ?No procedures performed ? ?Clinical Data: ?No additional findings. ? ?ROS: ? ?All other systems negative, except as noted in the HPI. ?Review of Systems ? ?Objective: ?Vital Signs: LMP 08/22/2015 (Approximate)  ? ?Specialty Comments:  ?No specialty comments available. ? ?PMFS History: ?Patient Active Problem List  ? Diagnosis Date Noted  ? Non-adherence to medical treatment 03/16/2022  ? Cutaneous abscess of right foot   ? Osteomyelitis of foot ([place]) 02/14/2022  ? Obesity (BMI 30-39.9)  01/26/2022  ? Ischemic ulcer of right foot ([place]) 01/24/2022  ? Normocytic anemia 01/24/2022  ? Sacral pressure ulcer 01/24/2022  ? Metabolic acidosis, increased anion gap 01/24/2022  ? Leg wound, left, sequela 12/10/2021  ? Open leg wound 09/03/2021  ? Wound infection   ? Non-pressure chronic ulcer of right calf limited to breakdown of skin ([place])   ? Calciphylaxis of right lower extremity with nonhealing ulcer, limited to breakdown of skin ([place])   ? Chronic ulcer of left thigh ([place]) 08/07/2021  ? Calciphylaxis of left lower extremity with nonhealing ulcer with necrosis of muscle ([place]) 08/07/2021  ? Lower GI bleed 05/12/2021  ? Acute GI bleeding 04/24/2021  ? Acute blood loss anemia 04/23/2021  ? GI bleed 04/22/2021  ? Fever 03/31/2021  ? Acute on chronic heart failure with preserved ejection fraction (HFpEF) ([place]) 03/30/2021  ? End stage renal disease ([place]) 11/16/2020  ? Calciphylaxis 11/06/2020  ? Non-healing open wound of heel 11/03/2020  ? Diabetic foot infection ([place]) 11/01/2020  ? Decubitus ulcer, heel 11/01/2020  ? Closed nondisplaced fracture of left patella 10/29/2020  ? Metabolic acidosis 70/35/0093  ? Acute on chronic renal failure ([place]) 06/10/2020  ? Acute pericardial effusion 06/10/2020  ? Chronic kidney disease, stage 4 (severe) ([place]) 03/05/2019  ? Vitamin D deficiency 01/28/2019  ? Type 2 diabetes mellitus with ESRD (end-stage renal disease) ([place]) 09/21/2015  ? Hyperlipidemia 09/21/2015  ? Essential hypertension, benign 09/21/2015  ? Hypothyroidism 09/21/2015  ? Iris bomb? 07/31/2012  ? Secondary angle-closure glaucoma 07/31/2012  ? ?Past Medical History:  ?Diagnosis Date  ? Anemia   ? Blindness of right eye with low vision in contralateral eye   ? s/p victrectomy  ? Diabetes mellitus, type II ([place])   ? Dyslipidemia   ? Glaucoma   ? History of blood transfusion   ? Hypertension   ? Hypothyroidism (acquired)   ? Kidney disease   ? Stage 5  ? Pneumonia   ?  ?Family History  ?Problem Relation Age of Onset   ? Heart disease Mother   ? Diabetes Mother   ? Kidney disease Mother   ? Diabetes Father   ? Heart disease Father   ? Diabetes Brother   ? Colon cancer Neg Hx   ?  ?Past Surgical History:  ?Procedure Laterality Date  ? ABDOMINAL AORTOGRAM W/LOWER EXTREMITY Bilateral 12/18/2020  ?  Procedure: ABDOMINAL AORTOGRAM W/LOWER EXTREMITY;  Surgeon: Elam Dutch, MD;  Location: [facility];  Service: Cardiovascular;  Laterality: Bilateral;  ? ABDOMINAL AORTOGRAM W/LOWER EXTREMITY Bilateral 01/25/2022  ? Procedure: ABDOMINAL AORTOGRAM W/LOWER EXTREMITY;  Surgeon: Serafina Mitchell, MD;  Location: [facility];  Service: Cardiovascular;  Laterality: Bilateral;  ? AMPUTATION Right 02/16/2022  ? Procedure: RIGHT FOOT 5TH RAY AMPUTATION;  Surgeon: Newt Minion, MD;  Location: [facility];  Service: Orthopedics;  Laterality: Right;  ? ANKLE FRACTURE SURGERY Right   ? AV FISTULA PLACEMENT Left 08/18/2020  ? Procedure: LEFT ARM BRACHIOCEPHALIC ARTERIOVENOUS (AV) FISTULA CREATION;  Surgeon: Elam Dutch, MD;  Location: [facility];  Service: Vascular;  Laterality: Left;  ? BIOPSY  04/24/2021  ? Procedure: BIOPSY;  Surgeon: Eloise Harman, DO;  [facility];  Service: Endoscopy;;  ? CESAREAN SECTION    ? CHOLECYSTECTOMY    ? COLONOSCOPY  04/24/2021  ? Surgeon: Eloise Harman, DO;  nonbleeding internal hemorrhoids, 1 large (25 mm) pedunculated transverse colon polyp (prolapse type polyp) with adherent clot and stigmata of recent bleed.  ? COLONOSCOPY WITH PROPOFOL N/A 05/14/2021  ? Procedure: COLONOSCOPY WITH PROPOFOL;  Surgeon: Daneil Dolin, MD;  [facility];  Service: Endoscopy;  Laterality: N/A;  ? ESOPHAGOGASTRODUODENOSCOPY (EGD) WITH PROPOFOL N/A 04/24/2021  ? Surgeon: Eloise Harman, DO;  duodenal erosions and gastritis biopsied (pathology with peptic duodenitis, reactive gastropathy with erosions/chronic inflammation, negative for H. pylori)  ? EYE SURGERY    ? Vatrectomy  ? HEMOSTASIS  CLIP PLACEMENT  05/14/2021  ? Procedure: HEMOSTASIS CLIP PLACEMENT;  Surgeon: Daneil Dolin, MD;  [facility];  Service: Endoscopy;;  ? IR PERC TUN PERIT CATH WO PORT S&I /IMAG  09/15/2020  ?

## 2022-03-22 ENCOUNTER — Ambulatory Visit (INDEPENDENT_AMBULATORY_CARE_PROVIDER_SITE_OTHER): Payer: Medicare Other | Admitting: Orthopedic Surgery

## 2022-03-22 ENCOUNTER — Encounter: Payer: Medicare Other | Admitting: Family

## 2022-03-22 DIAGNOSIS — L97911 Non-pressure chronic ulcer of unspecified part of right lower leg limited to breakdown of skin: Secondary | ICD-10-CM

## 2022-03-22 DIAGNOSIS — L02611 Cutaneous abscess of right foot: Secondary | ICD-10-CM | POA: Diagnosis not present

## 2022-03-22 DIAGNOSIS — L97923 Non-pressure chronic ulcer of unspecified part of left lower leg with necrosis of muscle: Secondary | ICD-10-CM | POA: Diagnosis not present

## 2022-03-23 ENCOUNTER — Ambulatory Visit: Payer: Medicare Other | Admitting: "Endocrinology

## 2022-03-24 ENCOUNTER — Encounter: Payer: Self-pay | Admitting: Orthopedic Surgery

## 2022-03-24 NOTE — Progress Notes (Signed)
? ?Office Visit Note ?  ?Patient: Amy Moses           ?Date of Birth: 1971-07-31           ?MRN: [id number] ?Visit Date: 03/22/2022 ?             ?Requested by: Emelda Fear, DO ?[address] [place] [address] ?PCP: Emelda Fear, DO ? ?Chief Complaint  ?Patient presents with  ? Right Leg - Wound Check  ? Left Leg - Wound Check  ? ? ? ? ?HPI: ?Patient presents in follow-up for calciphylaxis wounds both lower extremities and a right foot wound status post lateral ray amputation. ? ?Assessment & Plan: ?Visit Diagnoses:  ?1. Cutaneous abscess of right foot   ?2. Calciphylaxis of left lower extremity with nonhealing ulcer with necrosis of muscle ([place])   ?3. Calciphylaxis of right lower extremity with nonhealing ulcer, limited to breakdown of skin ([place])   ? ? ?Plan: Continue with current wound care to the right lower extremity the risk with increased swelling recommended elevation.  Continue serial compression wraps to the left lower extremity. ? ?Follow-Up Instructions: Return in about 1 week (around 03/29/2022).  ? ?Ortho Exam ? ?Patient is alert, oriented, no adenopathy, well-dressed, normal affect, normal respiratory effort. ?Examination the left leg has 100% healthy granulation tissue it measures 9 x 1.5 cm.  On the right leg there is increased swelling the lateral foot wound is 9 x 3 cm and has improved granulation tissue. ? ?Imaging: ?No results found. ? ? ? ?Labs: ?Lab Results  ?Component Value Date  ? HGBA1C 11.5 (H) 02/15/2022  ? HGBA1C 13.0 12/30/2021  ? HGBA1C 12.2 (H) 12/10/2021  ? ESRSEDRATE 34 (H) 02/14/2022  ? ESRSEDRATE 40 (H) 08/07/2021  ? CRP 7.0 (H) 02/14/2022  ? CRP 11.1 (H) 08/07/2021  ? REPTSTATUS 01/29/2022 FINAL 01/24/2022  ? GRAMSTAIN  07/08/2021  ?  RARE WBC PRESENT,BOTH PMN AND MONONUCLEAR ?RARE GRAM POSITIVE RODS ?RARE GRAM POSITIVE COCCI IN PAIRS ?  ? CULT  01/24/2022  ?  NO GROWTH 5 DAYS ?Performed at [facility], [place] [address], [place], [place] [address] ?  ? LABORGA  CITROBACTER KOSERI 05/21/2021  ? [place] 05/21/2021  ? ? ? ?Lab Results  ?Component Value Date  ? ALBUMIN 2.7 (L) 02/18/2022  ? ALBUMIN 2.4 (L) 02/17/2022  ? ALBUMIN 2.5 (L) 02/16/2022  ? PREALBUMIN 13.7 (L) 08/07/2021  ? ? ?Lab Results  ?Component Value Date  ? MG 2.1 05/14/2021  ? MG 2.8 (H) 05/11/2021  ? MG 2.3 04/23/2021  ? ?Lab Results  ?Component Value Date  ? VD25OH 36.9 12/30/2021  ? ? ?Lab Results  ?Component Value Date  ? PREALBUMIN 13.7 (L) 08/07/2021  ? ? ?  Latest Ref Rng & Units 02/18/2022  ?  3:17 AM 02/17/2022  ?  2:02 AM 02/15/2022  ?  6:41 AM  ?CBC EXTENDED  ?WBC 4.0 - 10.5 K/uL 8.1   7.9   7.1    ?RBC 3.87 - 5.11 MIL/uL 3.53   3.68   4.02    ?Hemoglobin 12.0 - 15.0 g/dL 9.6   10.1   11.0    ?HCT 36.0 - 46.0 % 31.1   32.3   35.0    ?Platelets 150 - 400 K/uL 206   209   227    ?NEUT# 1.7 - 7.7 K/uL  5.5     ?Lymph# 0.7 - 4.0 K/uL  1.2     ? ? ? ?  There is no height or weight on file to calculate BMI. ? ?Orders:  ?No orders of the defined types were placed in this encounter. ? ?No orders of the defined types were placed in this encounter. ? ? ? Procedures: ?No procedures performed ? ?Clinical Data: ?No additional findings. ? ?ROS: ? ?All other systems negative, except as noted in the HPI. ?Review of Systems ? ?Objective: ?Vital Signs: LMP 08/22/2015 (Approximate)  ? ?Specialty Comments:  ?No specialty comments available. ? ?PMFS History: ?Patient Active Problem List  ? Diagnosis Date Noted  ? Non-adherence to medical treatment 03/16/2022  ? Cutaneous abscess of right foot   ? Osteomyelitis of foot ([place]) 02/14/2022  ? Obesity (BMI 30-39.9) 01/26/2022  ? Ischemic ulcer of right foot ([place]) 01/24/2022  ? Normocytic anemia 01/24/2022  ? Sacral pressure ulcer 01/24/2022  ? Metabolic acidosis, increased anion gap 01/24/2022  ? Leg wound, left, sequela 12/10/2021  ? Open leg wound 09/03/2021  ? Wound infection   ? Non-pressure chronic ulcer of right calf limited to breakdown of skin ([place])   ?  Calciphylaxis of right lower extremity with nonhealing ulcer, limited to breakdown of skin ([place])   ? Chronic ulcer of left thigh ([place]) 08/07/2021  ? Calciphylaxis of left lower extremity with nonhealing ulcer with necrosis of muscle ([place]) 08/07/2021  ? Lower GI bleed 05/12/2021  ? Acute GI bleeding 04/24/2021  ? Acute blood loss anemia 04/23/2021  ? GI bleed 04/22/2021  ? Fever 03/31/2021  ? Acute on chronic heart failure with preserved ejection fraction (HFpEF) ([place]) 03/30/2021  ? End stage renal disease ([place]) 11/16/2020  ? Calciphylaxis 11/06/2020  ? Non-healing open wound of heel 11/03/2020  ? Diabetic foot infection ([place]) 11/01/2020  ? Decubitus ulcer, heel 11/01/2020  ? Closed nondisplaced fracture of left patella 10/29/2020  ? Metabolic acidosis 40/98/1191  ? Acute on chronic renal failure ([place]) 06/10/2020  ? Acute pericardial effusion 06/10/2020  ? Chronic kidney disease, stage 4 (severe) ([place]) 03/05/2019  ? Vitamin D deficiency 01/28/2019  ? Type 2 diabetes mellitus with ESRD (end-stage renal disease) ([place]) 09/21/2015  ? Hyperlipidemia 09/21/2015  ? Essential hypertension, benign 09/21/2015  ? Hypothyroidism 09/21/2015  ? Iris bomb? 07/31/2012  ? Secondary angle-closure glaucoma 07/31/2012  ? ?Past Medical History:  ?Diagnosis Date  ? Anemia   ? Blindness of right eye with low vision in contralateral eye   ? s/p victrectomy  ? Diabetes mellitus, type II ([place])   ? Dyslipidemia   ? Glaucoma   ? History of blood transfusion   ? Hypertension   ? Hypothyroidism (acquired)   ? Kidney disease   ? Stage 5  ? Pneumonia   ?  ?Family History  ?Problem Relation Age of Onset  ? Heart disease Mother   ? Diabetes Mother   ? Kidney disease Mother   ? Diabetes Father   ? Heart disease Father   ? Diabetes Brother   ? Colon cancer Neg Hx   ?  ?Past Surgical History:  ?Procedure Laterality Date  ? ABDOMINAL AORTOGRAM W/LOWER EXTREMITY Bilateral 12/18/2020  ? Procedure: ABDOMINAL AORTOGRAM W/LOWER EXTREMITY;  Surgeon: Elam Dutch, MD;  Location: [facility];  Service: Cardiovascular;  Laterality: Bilateral;  ? ABDOMINAL AORTOGRAM W/LOWER EXTREMITY Bilateral 01/25/2022  ? Procedure: ABDOMINAL AORTOGRAM W/LOWER EXTREMITY;  Surgeon: Serafina Mitchell, MD;  Location: [facility];  Service: Cardiovascular;  Laterality: Bilateral;  ? AMPUTATION Right 02/16/2022  ? Procedure: RIGHT FOOT 5TH RAY AMPUTATION;  Surgeon: Newt Minion, MD;  Location: [facility];  Service: Orthopedics;  Laterality: Right;  ? ANKLE FRACTURE SURGERY Right   ? AV FISTULA PLACEMENT Left 08/18/2020  ? Procedure: LEFT ARM BRACHIOCEPHALIC ARTERIOVENOUS (AV) FISTULA CREATION;  Surgeon: Elam Dutch, MD;  Location: [facility];  Service: Vascular;  Laterality: Left;  ? BIOPSY  04/24/2021  ? Procedure: BIOPSY;  Surgeon: Eloise Harman, DO;  [facility];  Service: Endoscopy;;  ? CESAREAN SECTION    ? CHOLECYSTECTOMY    ? COLONOSCOPY  04/24/2021  ? Surgeon: Eloise Harman, DO;  nonbleeding internal hemorrhoids, 1 large (25 mm) pedunculated transverse colon polyp (prolapse type polyp) with adherent clot and stigmata of recent bleed.  ? COLONOSCOPY WITH PROPOFOL N/A 05/14/2021  ? Procedure: COLONOSCOPY WITH PROPOFOL;  Surgeon: Daneil Dolin, MD;  [facility];  Service: Endoscopy;  Laterality: N/A;  ? ESOPHAGOGASTRODUODENOSCOPY (EGD) WITH PROPOFOL N/A 04/24/2021  ? Surgeon: Eloise Harman, DO;  duodenal erosions and gastritis biopsied (pathology with peptic duodenitis, reactive gastropathy with erosions/chronic inflammation, negative for H. pylori)  ? EYE SURGERY    ? Vatrectomy  ? HEMOSTASIS CLIP PLACEMENT  05/14/2021  ? Procedure: HEMOSTASIS CLIP PLACEMENT;  Surgeon: Daneil Dolin, MD;  [facility];  Service: Endoscopy;;  ? IR PERC TUN PERIT CATH WO PORT S&I /IMAG  09/15/2020  ? IR REMOVAL TUN CV CATH W/O FL  02/19/2021  ? IR US GUIDE VASC ACCESS RIGHT  09/15/2020  ? POLYPECTOMY  04/24/2021  ? Procedure: POLYPECTOMY;   Surgeon: Eloise Harman, DO;  [facility];  Service: Endoscopy;;  ? POLYPECTOMY  05/14/2021  ? Procedure: POLYPECTOMY;  Surgeon: Daneil Dolin, MD;  [facility];  Service: Endo

## 2022-03-28 ENCOUNTER — Ambulatory Visit (INDEPENDENT_AMBULATORY_CARE_PROVIDER_SITE_OTHER): Payer: Medicare Other | Admitting: Orthopedic Surgery

## 2022-03-28 ENCOUNTER — Encounter: Payer: Self-pay | Admitting: Orthopedic Surgery

## 2022-03-28 DIAGNOSIS — L02611 Cutaneous abscess of right foot: Secondary | ICD-10-CM

## 2022-03-28 DIAGNOSIS — L97923 Non-pressure chronic ulcer of unspecified part of left lower leg with necrosis of muscle: Secondary | ICD-10-CM

## 2022-03-28 NOTE — Progress Notes (Signed)
? ?Office Visit Note ?  ?Patient: Amy Moses           ?Date of Birth: 1971-01-29           ?MRN: [id number] ?Visit Date: 03/28/2022 ?             ?Requested by: Emelda Fear, DO ?[address] ?[place] [address] ?PCP: Emelda Fear, DO ? ?Chief Complaint  ?Patient presents with  ? Right Leg - Follow-up  ? Left Leg - Follow-up  ? ? ? ? ?HPI: ?Patient is a 51 year old woman is seen in follow-up for both lower extremities she is status post fifth ray amputation on the right status post skin grafting to both calfs with calciphylaxis. ? ?Assessment & Plan: ?Visit Diagnoses: No diagnosis found. ? ?Plan: We will continue with the Dynaflex compression wrap on the left with wound dressing changes to the right. ? ?Follow-Up Instructions: Return in about 1 week (around 04/04/2022).  ? ?Ortho Exam ? ?Patient is alert, oriented, no adenopathy, well-dressed, normal affect, normal respiratory effort. ?Examination the left calf wound continues to improve it is smaller has healthy granulation tissue will apply a Dynaflex wrap on the left.  Examination the right foot patient has a large wound breakdown laterally from the ray amputation.  This was debrided there is improved granulation tissue.  We will continue with Dial soap cleansing and dry dressing changes on the right.  The ulcers over the dorsum of the ankle have stable scabs with no breakdown no cellulitis no drainage on the right. ? ?Imaging: ?No results found. ? ? ? ? ?Labs: ?Lab Results  ?Component Value Date  ? HGBA1C 11.5 (H) 02/15/2022  ? HGBA1C 13.0 12/30/2021  ? HGBA1C 12.2 (H) 12/10/2021  ? ESRSEDRATE 34 (H) 02/14/2022  ? ESRSEDRATE 40 (H) 08/07/2021  ? CRP 7.0 (H) 02/14/2022  ? CRP 11.1 (H) 08/07/2021  ? REPTSTATUS 01/29/2022 FINAL 01/24/2022  ? GRAMSTAIN  07/08/2021  ?  RARE WBC PRESENT,BOTH PMN AND MONONUCLEAR ?RARE GRAM POSITIVE RODS ?RARE GRAM POSITIVE COCCI IN PAIRS ?  ? CULT  01/24/2022  ?  NO GROWTH 5 DAYS ?Performed at [facility],  [place] [address] [place], [place] [address] ?  ? LABORGA CITROBACTER KOSERI 05/21/2021  ? [place] 05/21/2021  ? ? ? ?Lab Results  ?Component Value Date  ? ALBUMIN 2.7 (L) 02/18/2022  ? ALBUMIN 2.4 (L) 02/17/2022  ? ALBUMIN 2.5 (L) 02/16/2022  ? PREALBUMIN 13.7 (L) 08/07/2021  ? ? ?Lab Results  ?Component Value Date  ? MG 2.1 05/14/2021  ? MG 2.8 (H) 05/11/2021  ? MG 2.3 04/23/2021  ? ?Lab Results  ?Component Value Date  ? VD25OH 36.9 12/30/2021  ? ? ?Lab Results  ?Component Value Date  ? PREALBUMIN 13.7 (L) 08/07/2021  ? ? ?  Latest Ref Rng & Units 02/18/2022  ?  3:17 AM 02/17/2022  ?  2:02 AM 02/15/2022  ?  6:41 AM  ?CBC EXTENDED  ?WBC 4.0 - 10.5 K/uL 8.1   7.9   7.1    ?RBC 3.87 - 5.11 MIL/uL 3.53   3.68   4.02    ?Hemoglobin 12.0 - 15.0 g/dL 9.6   10.1   11.0    ?HCT 36.0 - 46.0 % 31.1   32.3   35.0    ?Platelets 150 - 400 K/uL 206   209   227    ?NEUT# 1.7 - 7.7 K/uL  5.5     ?Lymph# 0.7 -  4.0 K/uL  1.2     ? ? ? ?There is no height or weight on file to calculate BMI. ? ?Orders:  ?No orders of the defined types were placed in this encounter. ? ?No orders of the defined types were placed in this encounter. ? ? ? Procedures: ?No procedures performed ? ?Clinical Data: ?No additional findings. ? ?ROS: ? ?All other systems negative, except as noted in the HPI. ?Review of Systems ? ?Objective: ?Vital Signs: LMP 08/22/2015 (Approximate)  ? ?Specialty Comments:  ?No specialty comments available. ? ?PMFS History: ?Patient Active Problem List  ? Diagnosis Date Noted  ? Non-adherence to medical treatment 03/16/2022  ? Cutaneous abscess of right foot   ? Osteomyelitis of foot ([place]) 02/14/2022  ? Obesity (BMI 30-39.9) 01/26/2022  ? Ischemic ulcer of right foot ([place]) 01/24/2022  ? Normocytic anemia 01/24/2022  ? Sacral pressure ulcer 01/24/2022  ? Metabolic acidosis, increased anion gap 01/24/2022  ? Leg wound, left, sequela 12/10/2021  ? Open leg wound 09/03/2021  ? Wound infection   ? Non-pressure chronic ulcer of  right calf limited to breakdown of skin ([place])   ? Calciphylaxis of right lower extremity with nonhealing ulcer, limited to breakdown of skin ([place])   ? Chronic ulcer of left thigh ([place]) 08/07/2021  ? Calciphylaxis of left lower extremity with nonhealing ulcer with necrosis of muscle ([place]) 08/07/2021  ? Lower GI bleed 05/12/2021  ? Acute GI bleeding 04/24/2021  ? Acute blood loss anemia 04/23/2021  ? GI bleed 04/22/2021  ? Fever 03/31/2021  ? Acute on chronic heart failure with preserved ejection fraction (HFpEF) ([place]) 03/30/2021  ? End stage renal disease ([place]) 11/16/2020  ? Calciphylaxis 11/06/2020  ? Non-healing open wound of heel 11/03/2020  ? Diabetic foot infection ([place]) 11/01/2020  ? Decubitus ulcer, heel 11/01/2020  ? Closed nondisplaced fracture of left patella 10/29/2020  ? Metabolic acidosis 38/08/1750  ? Acute on chronic renal failure ([place]) 06/10/2020  ? Acute pericardial effusion 06/10/2020  ? Chronic kidney disease, stage 4 (severe) ([place]) 03/05/2019  ? Vitamin D deficiency 01/28/2019  ? Type 2 diabetes mellitus with ESRD (end-stage renal disease) ([place]) 09/21/2015  ? Hyperlipidemia 09/21/2015  ? Essential hypertension, benign 09/21/2015  ? Hypothyroidism 09/21/2015  ? Iris bomb? 07/31/2012  ? Secondary angle-closure glaucoma 07/31/2012  ? ?Past Medical History:  ?Diagnosis Date  ? Anemia   ? Blindness of right eye with low vision in contralateral eye   ? s/p victrectomy  ? Diabetes mellitus, type II ([place])   ? Dyslipidemia   ? Glaucoma   ? History of blood transfusion   ? Hypertension   ? Hypothyroidism (acquired)   ? Kidney disease   ? Stage 5  ? Pneumonia   ?  ?Family History  ?Problem Relation Age of Onset  ? Heart disease Mother   ? Diabetes Mother   ? Kidney disease Mother   ? Diabetes Father   ? Heart disease Father   ? Diabetes Brother   ? Colon cancer Neg Hx   ?  ?Past Surgical History:  ?Procedure Laterality Date  ? ABDOMINAL AORTOGRAM W/LOWER EXTREMITY Bilateral 12/18/2020  ? Procedure: ABDOMINAL  AORTOGRAM W/LOWER EXTREMITY;  Surgeon: Elam Dutch, MD;  Location: [facility];  Service: Cardiovascular;  Laterality: Bilateral;  ? ABDOMINAL AORTOGRAM W/LOWER EXTREMITY Bilateral 01/25/2022  ? Procedure: ABDOMINAL AORTOGRAM W/LOWER EXTREMITY;  Surgeon: Serafina Mitchell, MD;  Location: [facility];  Service: Cardiovascular;  Laterality: Bilateral;  ? AMPUTATION  Right 02/16/2022  ? Procedure: RIGHT FOOT 5TH RAY AMPUTATION;  Surgeon: Newt Minion, MD;  Location: [facility];  Service: Orthopedics;  Laterality: Right;  ? ANKLE FRACTURE SURGERY Right   ? AV FISTULA PLACEMENT Left 08/18/2020  ? Procedure: LEFT ARM BRACHIOCEPHALIC ARTERIOVENOUS (AV) FISTULA CREATION;  Surgeon: Elam Dutch, MD;  Location: [facility];  Service: Vascular;  Laterality: Left;  ? BIOPSY  04/24/2021  ? Procedure: BIOPSY;  Surgeon: Eloise Harman, DO;  [facility];  Service: Endoscopy;;  ? CESAREAN SECTION    ? CHOLECYSTECTOMY    ? COLONOSCOPY  04/24/2021  ? Surgeon: Eloise Harman, DO;  nonbleeding internal hemorrhoids, 1 large (25 mm) pedunculated transverse colon polyp (prolapse type polyp) with adherent clot and stigmata of recent bleed.  ? COLONOSCOPY WITH PROPOFOL N/A 05/14/2021  ? Procedure: COLONOSCOPY WITH PROPOFOL;  Surgeon: Daneil Dolin, MD;  [facility];  Service: Endoscopy;  Laterality: N/A;  ? ESOPHAGOGASTRODUODENOSCOPY (EGD) WITH PROPOFOL N/A 04/24/2021  ? Surgeon: Eloise Harman, DO;  duodenal erosions and gastritis biopsied (pathology with peptic duodenitis, reactive gastropathy with erosions/chronic inflammation, negative for H. pylori)  ? EYE SURGERY    ? Vatrectomy  ? HEMOSTASIS CLIP PLACEMENT  05/14/2021  ? Procedure: HEMOSTASIS CLIP PLACEMENT;  Surgeon: Daneil Dolin, MD;  [facility];  Service: Endoscopy;;  ? IR PERC TUN PERIT CATH WO PORT S&I /IMAG  09/15/2020  ? IR REMOVAL TUN CV CATH W/O FL  02/19/2021  ? IR US GUIDE VASC ACCESS RIGHT  09/15/2020  ?  POLYPECTOMY  04/24/2021  ? Procedure: POLYPECTOMY;  Surgeon: Eloise Harman, DO;  [facility];  Service: Endoscopy;;  ? POLYPECTOMY  05/14/2021  ? Procedure: POLYPECTOMY;  Surgeon: Manus Rudd

## 2022-03-30 ENCOUNTER — Ambulatory Visit: Payer: Medicare Other | Admitting: "Endocrinology

## 2022-04-04 ENCOUNTER — Ambulatory Visit: Payer: Medicare Other | Admitting: Orthopedic Surgery

## 2022-04-04 ENCOUNTER — Telehealth: Payer: Self-pay | Admitting: Orthopedic Surgery

## 2022-04-04 NOTE — Telephone Encounter (Signed)
Thank you. I rescheduled her appt for 1 pm tomorrow. ?

## 2022-04-04 NOTE — Telephone Encounter (Signed)
Pt called requesting an appt tomorrow. She states she has no transportation today. Please call pt at [phone] ?

## 2022-04-04 NOTE — Telephone Encounter (Signed)
Will you please see what time she can come in and I can open up a spot, thank you ?

## 2022-04-05 ENCOUNTER — Ambulatory Visit (INDEPENDENT_AMBULATORY_CARE_PROVIDER_SITE_OTHER): Payer: Medicare Other | Admitting: Orthopedic Surgery

## 2022-04-05 DIAGNOSIS — L97923 Non-pressure chronic ulcer of unspecified part of left lower leg with necrosis of muscle: Secondary | ICD-10-CM | POA: Diagnosis not present

## 2022-04-05 DIAGNOSIS — L97911 Non-pressure chronic ulcer of unspecified part of right lower leg limited to breakdown of skin: Secondary | ICD-10-CM

## 2022-04-06 ENCOUNTER — Encounter: Payer: Self-pay | Admitting: "Endocrinology

## 2022-04-06 ENCOUNTER — Ambulatory Visit (INDEPENDENT_AMBULATORY_CARE_PROVIDER_SITE_OTHER): Payer: Medicare Other | Admitting: "Endocrinology

## 2022-04-06 ENCOUNTER — Encounter: Payer: Medicare Other | Attending: "Endocrinology | Admitting: Nutrition

## 2022-04-06 VITALS — BP 128/74 | HR 76

## 2022-04-06 DIAGNOSIS — E039 Hypothyroidism, unspecified: Secondary | ICD-10-CM

## 2022-04-06 DIAGNOSIS — E782 Mixed hyperlipidemia: Secondary | ICD-10-CM

## 2022-04-06 DIAGNOSIS — Z91199 Patient's noncompliance with other medical treatment and regimen due to unspecified reason: Secondary | ICD-10-CM

## 2022-04-06 DIAGNOSIS — E559 Vitamin D deficiency, unspecified: Secondary | ICD-10-CM

## 2022-04-06 DIAGNOSIS — N186 End stage renal disease: Secondary | ICD-10-CM | POA: Diagnosis not present

## 2022-04-06 DIAGNOSIS — I10 Essential (primary) hypertension: Secondary | ICD-10-CM

## 2022-04-06 DIAGNOSIS — S91301D Unspecified open wound, right foot, subsequent encounter: Secondary | ICD-10-CM

## 2022-04-06 DIAGNOSIS — E669 Obesity, unspecified: Secondary | ICD-10-CM

## 2022-04-06 DIAGNOSIS — I5033 Acute on chronic diastolic (congestive) heart failure: Secondary | ICD-10-CM

## 2022-04-06 DIAGNOSIS — Z713 Dietary counseling and surveillance: Secondary | ICD-10-CM | POA: Diagnosis not present

## 2022-04-06 DIAGNOSIS — E1122 Type 2 diabetes mellitus with diabetic chronic kidney disease: Secondary | ICD-10-CM

## 2022-04-06 MED ORDER — INSULIN GLARGINE 100 UNIT/ML ~~LOC~~ SOLN: 20.0000 [IU] | Freq: Every day | SUBCUTANEOUS | 1 refills | Status: DC

## 2022-04-06 MED ORDER — FREESTYLE LIBRE 2 SENSOR MISC: 1.0000 | 3 refills | Status: DC

## 2022-04-06 MED ORDER — FREESTYLE LIBRE 2 READER DEVI: 0 refills | Status: DC

## 2022-04-06 NOTE — Progress Notes (Signed)
Medical Nutrition Therapy  Appointment Start time:  9233  Appointment End time:  61  Primary concerns today: DM Type 2, ESRD  Referral diagnosis: E11.8, N18.5 Preferred learning style: See and hands on.  Learning readiness: Changes in progress    NUTRITION ASSESSMENT  51 yr old bfemale here today for DM Education and to see Dr. Dorris Fetch.  She is in a wheel chair. Has arthritis or neuropathy in her hands. Baby toe on right foot was recently amputated. Blind in the right eye. 7 days avg 203 mg/dl, 30 day 221 mg/dl 14 day 215 mg/dl Dialysis Tuesdays,,Thursdays, Saturdays  Diet is inconsistent to meet her needs. She needs to eat 3 balanced meals and take her insulin as instructed for improved blood sugars.  Anthropometrics  Can't weigh today due to foot surgery   Clinical Medical Hx: See chart Medications: Lantus  15 units at night,Humalog 5 units plus sliding scales. Labs:  Lab Results  Component Value Date   HGBA1C 11.5 (H) 02/15/2022   Cmet    Latest Ref Rng & Units 02/18/2022    3:17 AM 02/17/2022    2:02 AM 02/16/2022    2:45 AM  CMP  Glucose 70 - 99 mg/dL 175   119   180    BUN 6 - 20 mg/dL 24   42   32    Creatinine 0.44 - 1.00 mg/dL 4.51   5.84   5.18    Sodium 135 - 145 mmol/L 132   135   134    Potassium 3.5 - 5.1 mmol/L 3.8   4.1   3.7    Chloride 98 - 111 mmol/L 96   98   96    CO2 22 - 32 mmol/L 26   24   24     Calcium 8.9 - 10.3 mg/dL 8.9   8.6   8.5    Total Protein 6.5 - 8.1 g/dL 5.7      Total Bilirubin 0.3 - 1.2 mg/dL 0.8      Alkaline Phos 38 - 126 U/L 88      AST 15 - 41 U/L 11      ALT 0 - 44 U/L 8        Notable Signs/Symptoms: Increased thirst, frequent urination, fatigue,   Lifestyle & Dietary Hx Liives with her daughter and boyfriend. Has arthrtis or neuropathy in hands.Had skin graf done for wounds done on back of calves. They aer healing.  Estimated daily fluid intake: 32 oz Supplements: see chart Sleep: 4-8 hrs depending on dialsys  days. Stress / self-care: None Current average weekly physical activity: ADL in wheelchair  24-Hr Dietary Recall First Meal: Skipped  but usually 3 scrambled eggs, 2 sausage patties and 2 blueberry waffles and sf syrup Snack: occasionally may have fruit; banana or orange. Second Meal: Skipped: Usually [place] sandwich on honeywheat bread and chips, water Snack: Flavor packets of water Third Meal: Chicken sandwich from [employer], fries no salt and water Snack: 2 chips ahoy cookies,  Beverages: water  Estimated Energy Needs Calories: 1800 Carbohydrate: 200g Protein: 93-112g Fat: 55 g   NUTRITION DIAGNOSIS  NB-1.1 Food and nutrition-related knowledge deficit As related to Diabetes Type 2 and ESRD.  As evidenced by ON dialysis and A1C 11.3%.   NUTRITION INTERVENTION  Nutrition education (E-1) on the following topics:  Nutrition and Diabetes education provided on My Plate, CHO counting, meal planning, portion sizes, timing of meals, avoiding snacks between meals unless having a low blood  sugar, target ranges for A1C and blood sugars, signs/symptoms and treatment of hyper/hypoglycemia, monitoring blood sugars, taking medications as prescribed, benefits of exercising 30 minutes per day and prevention of complications of DM. Lifestyle Medicine  - Whole Food, Plant Predominant Nutrition is highly recommended: Eat Plenty of vegetables, Mushrooms, fruits, Legumes, Whole Grains, Nuts, seeds in lieu of processed meats, processed snacks/pastries red meat, poultry, eggs.    -It is better to avoid simple carbohydrates including: Cakes, Sweet Desserts, Ice Cream, Soda (diet and regular), Sweet Tea, Candies, Chips, Cookies, Store Bought Juices, Alcohol in Excess of  1-2 drinks a day, Lemonade,  Artificial Sweeteners, Doughnuts, Coffee Creamers, "Sugar-free" Products, etc, etc.  This is not a complete list.....  Exercise: If you are able: 30 -60 minutes a day ,4 days a week, or 150 minutes a week.   The longer the better.  Combine stretch, strength, and aerobic activities.  If you were told in the past that you have high risk for cardiovascular diseases, you may seek evaluation by your heart doctor prior to initiating moderate to intense exercise programs.  Renal diet and foods to monitor based on labs of potassium, Mg, Sodium and Phosphorus.   Handouts Provided Include  Renal diet Lifestyle medicine  Learning Style & Readiness for Change Teaching method utilized: Visual & Auditory  Demonstrated degree of understanding via: Teach Back  Barriers to learning/adherence to lifestyle change: no  Goals Established by Pt Eat three meals per day following renal restrictions Take insulin and other medications as prescribed. Drink only water-stay in your fluid allowance. Cut out processed junk food. Increase protein rich foods for wound healing. Choose foods that come from a garden that you can have-plant based fruits, vegetables and grains that you can have. Get A1C down to 7%.   MONITORING & EVALUATION Dietary intake, weekly physical activity, and blood sugars in 1 month.  Next Steps  Patient is to work on better meal planning.Marland Kitchen

## 2022-04-06 NOTE — Progress Notes (Signed)
? ?                                                          ?     04/06/2022, 5:05 PM ? ?Endocrinology follow-up note ? ? ?Subjective:  ? ? Patient ID: Amy Moses, female    DOB: 1971/08/08.  ?[name] is being seen in follow-up after she was seen in consultation for management of currently uncontrolled symptomatic diabetes requested by  Emelda Fear, DO. ? ? ?Past Medical History:  ?Diagnosis Date  ? Anemia   ? Blindness of right eye with low vision in contralateral eye   ? s/p victrectomy  ? Diabetes mellitus, type II ([place])   ? Dyslipidemia   ? Glaucoma   ? History of blood transfusion   ? Hypertension   ? Hypothyroidism (acquired)   ? Kidney disease   ? Stage 5  ? Pneumonia   ? ? ?Past Surgical History:  ?Procedure Laterality Date  ? ABDOMINAL AORTOGRAM W/LOWER EXTREMITY Bilateral 12/18/2020  ? Procedure: ABDOMINAL AORTOGRAM W/LOWER EXTREMITY;  Surgeon: Elam Dutch, MD;  Location: [facility];  Service: Cardiovascular;  Laterality: Bilateral;  ? ABDOMINAL AORTOGRAM W/LOWER EXTREMITY Bilateral 01/25/2022  ? Procedure: ABDOMINAL AORTOGRAM W/LOWER EXTREMITY;  Surgeon: Serafina Mitchell, MD;  Location: [facility];  Service: Cardiovascular;  Laterality: Bilateral;  ? AMPUTATION Right 02/16/2022  ? Procedure: RIGHT FOOT 5TH RAY AMPUTATION;  Surgeon: Newt Minion, MD;  Location: [facility];  Service: Orthopedics;  Laterality: Right;  ? ANKLE FRACTURE SURGERY Right   ? AV FISTULA PLACEMENT Left 08/18/2020  ? Procedure: LEFT ARM BRACHIOCEPHALIC ARTERIOVENOUS (AV) FISTULA CREATION;  Surgeon: Elam Dutch, MD;  Location: [facility];  Service: Vascular;  Laterality: Left;  ? BIOPSY  04/24/2021  ? Procedure: BIOPSY;  Surgeon: Eloise Harman, DO;  [facility];  Service: Endoscopy;;  ? CESAREAN SECTION    ? CHOLECYSTECTOMY    ? COLONOSCOPY  04/24/2021  ? Surgeon: Eloise Harman, DO;  nonbleeding internal hemorrhoids, 1 large (25 mm) pedunculated transverse colon  polyp (prolapse type polyp) with adherent clot and stigmata of recent bleed.  ? COLONOSCOPY WITH PROPOFOL N/A 05/14/2021  ? Procedure: COLONOSCOPY WITH PROPOFOL;  Surgeon: Daneil Dolin, MD;  [facility];  Service: Endoscopy;  Laterality: N/A;  ? ESOPHAGOGASTRODUODENOSCOPY (EGD) WITH PROPOFOL N/A 04/24/2021  ? Surgeon: Eloise Harman, DO;  duodenal erosions and gastritis biopsied (pathology with peptic duodenitis, reactive gastropathy with erosions/chronic inflammation, negative for H. pylori)  ? EYE SURGERY    ? Vatrectomy  ? HEMOSTASIS CLIP PLACEMENT  05/14/2021  ? Procedure: HEMOSTASIS CLIP PLACEMENT;  Surgeon: Daneil Dolin, MD;  [facility];  Service: Endoscopy;;  ? IR PERC TUN PERIT CATH WO PORT S&I /IMAG  09/15/2020  ? IR REMOVAL TUN CV CATH W/O FL  02/19/2021  ? IR US GUIDE VASC ACCESS RIGHT  09/15/2020  ? POLYPECTOMY  04/24/2021  ? Procedure: POLYPECTOMY;  Surgeon: Eloise Harman, DO;  [facility];  Service: Endoscopy;;  ? POLYPECTOMY  05/14/2021  ? Procedure: POLYPECTOMY;  Surgeon: Daneil Dolin, MD;  [facility];  Service: Endoscopy;;  ? SKIN SPLIT GRAFT Bilateral 09/03/2021  ? Procedure: SKIN GRAFT BILATERAL LEGS;  Surgeon: [name],  Illene Regulus, MD;  Location: [facility];  Service: Orthopedics;  Laterality: Bilateral;  ? SKIN SPLIT GRAFT Left 12/10/2021  ? Procedure: IRRIGATION AND DEBRIDEMENT LEFT CALF, APPLICATION SPLIT THICKNESS SKIN GRAFT;  Surgeon: Newt Minion, MD;  Location: [facility];  Service: Orthopedics;  Laterality: Left;  ? TOE SURGERY    ? ? ?Social History  ? ?Socioeconomic History  ? Marital status: Single  ?  Spouse name: Not on file  ? Number of children: Not on file  ? Years of education: Not on file  ? Highest education level: Not on file  ?Occupational History  ? Not on file  ?Tobacco Use  ? Smoking status: Never  ? Smokeless tobacco: Never  ?Vaping Use  ? Vaping Use: Never used  ?Substance and Sexual Activity  ? Alcohol use: No  ? Drug  use: No  ? Sexual activity: Yes  ?  Birth control/protection: Condom  ?Other Topics Concern  ? Not on file  ?Social History Narrative  ? Not on file  ? ?Social Determinants of Health  ? ?Financial Resource Strain: Not on file  ?Food Insecurity: Not on file  ?Transportation Needs: Not on file  ?Physical Activity: Not on file  ?Stress: Not on file  ?Social Connections: Not on file  ? ? ?Family History  ?Problem Relation Age of Onset  ? Heart disease Mother   ? Diabetes Mother   ? Kidney disease Mother   ? Diabetes Father   ? Heart disease Father   ? Diabetes Brother   ? Colon cancer Neg Hx   ? ? ?Outpatient Encounter Medications as of 04/06/2022  ?Medication Sig  ? acetaminophen (TYLENOL) 500 MG tablet Take 1,000 mg by mouth every 6 (six) hours as needed for moderate pain.  ? ascorbic acid (VITAMIN C) 1000 MG tablet Take 1 tablet (1,000 mg total) by mouth daily. (Patient not taking: Reported on 03/16/2022)  ? aspirin EC 81 MG tablet Take 1 tablet (81 mg total) by mouth daily with breakfast.  ? atorvastatin (LIPITOR) 10 MG tablet Take 1 tablet (10 mg total) by mouth daily. (Patient taking differently: Take 10 mg by mouth at bedtime.)  ? AURYXIA 1 GM 210 MG(Fe) tablet Take 420 mg by mouth 2 (two) times daily with a meal.  ? Blood Glucose Monitoring Suppl (ACCU-CHEK GUIDE ME) w/Device KIT 1 Piece by Does not apply route as directed.  ? glucose blood (ACCU-CHEK GUIDE) test strip Use as instructed  ? HUMALOG KWIKPEN 100 UNIT/ML KwikPen Inject 5-11 Units into the skin 3 (three) times daily with meals. If eats 50% or more of meal.  ? insulin glargine (LANTUS) 100 UNIT/ML injection Inject 0.2 mLs (20 Units total) into the skin at bedtime.  ? lactulose (CHRONULAC) 10 GM/15ML solution Take 10 g by mouth daily as needed for moderate constipation.  ? levothyroxine (SYNTHROID) 50 MCG tablet Take 1 tablet (50 mcg total) by mouth daily before breakfast.  ? pantoprazole (PROTONIX) 40 MG tablet Take 1 tablet (40 mg total) by mouth 2  (two) times daily.  ? pentoxifylline (TRENTAL) 400 MG CR tablet Take 1 tablet (400 mg total) by mouth 3 (three) times daily with meals.  ? polyethylene glycol (MIRALAX / GLYCOLAX) 17 g packet Take 17 g by mouth daily as needed for mild constipation.  ? silver sulfADIAZINE (SILVADENE) 1 % cream Apply 1 application. topically daily. Apply to affected area daily plus dry dressing  ? torsemide (DEMADEX) 100 MG tablet Take 150 mg by mouth daily.   ?  traMADol (ULTRAM) 50 MG tablet Take 1 tablet (50 mg total) by mouth every 6 (six) hours as needed.  ? Vitamin D, Ergocalciferol, (DRISDOL) 1.25 MG (50000 UNIT) CAPS capsule Take 50,000 Units by mouth every 14 (fourteen) days.  ? [DISCONTINUED] insulin glargine (LANTUS) 100 UNIT/ML injection Inject 0.15 mLs (15 Units total) into the skin at bedtime.  ? ?No facility-administered encounter medications on file as of 04/06/2022.  ? ? ?ALLERGIES: ?Allergies  ?Allergen Reactions  ? Ace Inhibitors Cough  ? ? ?VACCINATION STATUS: ?Immunization History  ?Administered Date(s) Administered  ? Hepatitis B, adult 11/09/2020  ? Influenza-Unspecified 10/03/2020  ? PPD Test 09/23/2020, 09/23/2020  ? Pneumococcal Polysaccharide-23 01/28/2019  ? Pneumococcal-Unspecified 05/30/2018  ? Tdap 06/04/2005, 10/21/2020  ? ? ?Diabetes ?She presents for her follow-up diabetic visit. She has type 2 diabetes mellitus. Onset time: This patient was diagnosed at approximate age of 85 years. Her disease course has been worsening (She was seen in 2016 for the same consult, failed to return for follow-up.). There are no hypoglycemic associated symptoms. Pertinent negatives for hypoglycemia include no confusion, headaches, pallor or seizures. Associated symptoms include blurred vision, fatigue and polydipsia. Pertinent negatives for diabetes include no chest pain, no polyphagia and no polyuria. There are no hypoglycemic complications. Symptoms are improving. Diabetic complications include nephropathy, peripheral  neuropathy and retinopathy. (Patient is now on hemodialysis due to end-stage renal disease.) Risk factors for coronary artery disease include dyslipidemia, family history, obesity and sedentary lifestyle. He

## 2022-04-06 NOTE — Patient Instructions (Signed)

## 2022-04-07 ENCOUNTER — Encounter: Payer: Self-pay | Admitting: Internal Medicine

## 2022-04-11 ENCOUNTER — Encounter: Payer: Self-pay | Admitting: Orthopedic Surgery

## 2022-04-11 ENCOUNTER — Ambulatory Visit (INDEPENDENT_AMBULATORY_CARE_PROVIDER_SITE_OTHER): Payer: Medicare Other | Admitting: Orthopedic Surgery

## 2022-04-11 DIAGNOSIS — L02611 Cutaneous abscess of right foot: Secondary | ICD-10-CM | POA: Diagnosis not present

## 2022-04-11 DIAGNOSIS — L97923 Non-pressure chronic ulcer of unspecified part of left lower leg with necrosis of muscle: Secondary | ICD-10-CM

## 2022-04-11 NOTE — Progress Notes (Signed)
Office Visit Note   Patient: Amy Moses           Date of Birth: 27-Jul-1971           MRN: [id number] Visit Date: 04/11/2022              Requested by: Emelda Fear, [place] [address] PCP: Emelda Fear, DO  No chief complaint on file.     HPI: Patient is a 51 year old woman who is status post right foot fifth ray amputation as well as skin graft for calciphylaxis both posterior calves.  The right calf is healed left calf is healing slowly.  Assessment & Plan: Visit Diagnoses:  1. Cutaneous abscess of right foot   2. Calciphylaxis of left lower extremity with nonhealing ulcer with necrosis of muscle (HCC)     Plan: Continue with compression wrap on the left continue with dry dressing changes and Dial soap cleansing on the right.  Follow-Up Instructions: Return in about 1 week (around 04/18/2022).   Ortho Exam  Patient is alert, oriented, no adenopathy, well-dressed, normal affect, normal respiratory effort. Patient states that her home health nurse was concerned with her hands.  On examination patient has intrinsic atrophy right worse than left no infection no triggering.  Examination of the left calf the wound continues to heal nicely its flat has good granulation tissue no cellulitis no odor no drainage.  Examination of the right foot there is improved granulation tissue we will continue with current wound care.  Imaging: No results found. No images are attached to the encounter.  Labs: Lab Results  Component Value Date   HGBA1C 11.5 (H) 02/15/2022   HGBA1C 13.0 12/30/2021   HGBA1C 12.2 (H) 12/10/2021   ESRSEDRATE 34 (H) 02/14/2022   ESRSEDRATE 40 (H) 08/07/2021   CRP 7.0 (H) 02/14/2022   CRP 11.1 (H) 08/07/2021   REPTSTATUS 01/29/2022 FINAL 01/24/2022   GRAMSTAIN  07/08/2021    RARE WBC PRESENT,BOTH PMN AND MONONUCLEAR RARE GRAM POSITIVE RODS RARE GRAM POSITIVE COCCI IN PAIRS    CULT  01/24/2022    NO GROWTH 5 DAYS Performed  at [facility], [place] [address], [place], [place] [address]    LABORGA CITROBACTER KOSERI 05/21/2021   LABORGA PROTEUS MIRABILIS 05/21/2021     Lab Results  Component Value Date   ALBUMIN 2.7 (L) 02/18/2022   ALBUMIN 2.4 (L) 02/17/2022   ALBUMIN 2.5 (L) 02/16/2022   PREALBUMIN 13.7 (L) 08/07/2021    Lab Results  Component Value Date   MG 2.1 05/14/2021   MG 2.8 (H) 05/11/2021   MG 2.3 04/23/2021   Lab Results  Component Value Date   VD25OH 36.9 12/30/2021    Lab Results  Component Value Date   PREALBUMIN 13.7 (L) 08/07/2021      Latest Ref Rng & Units 02/18/2022    3:17 AM 02/17/2022    2:02 AM 02/15/2022    6:41 AM  CBC EXTENDED  WBC 4.0 - 10.5 K/uL 8.1   7.9   7.1    RBC 3.87 - 5.11 MIL/uL 3.53   3.68   4.02    Hemoglobin 12.0 - 15.0 g/dL 9.6   10.1   11.0    HCT 36.0 - 46.0 % 31.1   32.3   35.0    Platelets 150 - 400 K/uL 206   209   227    NEUT# 1.7 - 7.7 K/uL  5.5     Lymph# 0.7 -  4.0 K/uL  1.2        There is no height or weight on file to calculate BMI.  Orders:  No orders of the defined types were placed in this encounter.  No orders of the defined types were placed in this encounter.    Procedures: No procedures performed  Clinical Data: No additional findings.  ROS:  All other systems negative, except as noted in the HPI. Review of Systems  Objective: Vital Signs: LMP 08/22/2015 (Approximate)   Specialty Comments:  No specialty comments available.  PMFS History: Patient Active Problem List   Diagnosis Date Noted   Non-adherence to medical treatment 03/16/2022   Cutaneous abscess of right foot    Osteomyelitis of foot ([place]) 02/14/2022   Obesity (BMI 30-39.9) 01/26/2022   Ischemic ulcer of right foot ([place]) 01/24/2022   Normocytic anemia 01/24/2022   Sacral pressure ulcer 52/77/8242   Metabolic acidosis, increased anion gap 01/24/2022   Leg wound, left, sequela 12/10/2021   Open leg wound 09/03/2021   Wound infection     Non-pressure chronic ulcer of right calf limited to breakdown of skin ([place])    Calciphylaxis of right lower extremity with nonhealing ulcer, limited to breakdown of skin ([place])    Chronic ulcer of left thigh ([place]) 08/07/2021   Calciphylaxis of left lower extremity with nonhealing ulcer with necrosis of muscle ([place]) 08/07/2021   Lower GI bleed 05/12/2021   Acute GI bleeding 04/24/2021   Acute blood loss anemia 04/23/2021   GI bleed 04/22/2021   Fever 03/31/2021   Acute on chronic heart failure with preserved ejection fraction (HFpEF) ([place]) 03/30/2021   End stage renal disease ([place]) 11/16/2020   Calciphylaxis 11/06/2020   Non-healing open wound of heel 11/03/2020   Diabetic foot infection ([place]) 11/01/2020   Decubitus ulcer, heel 11/01/2020   Closed nondisplaced fracture of left patella 35/36/1443   Metabolic acidosis 15/40/0867   Acute on chronic renal failure ([place]) 06/10/2020   Acute pericardial effusion 06/10/2020   Chronic kidney disease, stage 4 (severe) ([place]) 03/05/2019   Vitamin D deficiency 01/28/2019   Type 2 diabetes mellitus with ESRD (end-stage renal disease) ([name]) 09/21/2015   Hyperlipidemia 09/21/2015   Essential hypertension, benign 09/21/2015   Hypothyroidism 09/21/2015   Iris bomb 07/31/2012   Secondary angle-closure glaucoma 07/31/2012   Past Medical History:  Diagnosis Date   Anemia    Blindness of right eye with low vision in contralateral eye    s/p victrectomy   Diabetes mellitus, type II ([place])    Dyslipidemia    Glaucoma    History of blood transfusion    Hypertension    Hypothyroidism (acquired)    Kidney disease    Stage 5   Pneumonia     Family History  Problem Relation Age of Onset   Heart disease Mother    Diabetes Mother    Kidney disease Mother    Diabetes Father    Heart disease Father    Diabetes Brother    Colon cancer Neg Hx     Past Surgical History:  Procedure Laterality Date   ABDOMINAL AORTOGRAM W/LOWER EXTREMITY Bilateral  12/18/2020   Procedure: ABDOMINAL AORTOGRAM W/LOWER EXTREMITY;  Surgeon: Elam Dutch, MD;  Location: [facility];  Service: Cardiovascular;  Laterality: Bilateral;   ABDOMINAL AORTOGRAM W/LOWER EXTREMITY Bilateral 01/25/2022   Procedure: ABDOMINAL AORTOGRAM W/LOWER EXTREMITY;  Surgeon: Serafina Mitchell, MD;  Location: [facility];  Service: Cardiovascular;  Laterality: Bilateral;   AMPUTATION  Right 02/16/2022   Procedure: RIGHT FOOT 5TH RAY AMPUTATION;  Surgeon: Newt Minion, MD;  Location: [facility];  Service: Orthopedics;  Laterality: Right;   ANKLE FRACTURE SURGERY Right    AV FISTULA PLACEMENT Left 08/18/2020   Procedure: LEFT ARM BRACHIOCEPHALIC ARTERIOVENOUS (AV) FISTULA CREATION;  Surgeon: Elam Dutch, MD;  Location: [facility];  Service: Vascular;  Laterality: Left;   BIOPSY  04/24/2021   Procedure: BIOPSY;  Surgeon: Eloise Harman, DO;  [facility];  Service: Endoscopy;;   CESAREAN SECTION     CHOLECYSTECTOMY     COLONOSCOPY  04/24/2021   Surgeon: Eloise Harman, DO;  nonbleeding internal hemorrhoids, 1 large (25 mm) pedunculated transverse colon polyp (prolapse type polyp) with adherent clot and stigmata of recent bleed.   COLONOSCOPY WITH PROPOFOL N/A 05/14/2021   Procedure: COLONOSCOPY WITH PROPOFOL;  Surgeon: Daneil Dolin, MD;  [facility];  Service: Endoscopy;  Laterality: N/A;   ESOPHAGOGASTRODUODENOSCOPY (EGD) WITH PROPOFOL N/A 04/24/2021   Surgeon: Eloise Harman, DO;  duodenal erosions and gastritis biopsied (pathology with peptic duodenitis, reactive gastropathy with erosions/chronic inflammation, negative for H. pylori)   EYE SURGERY     Vatrectomy   HEMOSTASIS CLIP PLACEMENT  05/14/2021   Procedure: HEMOSTASIS CLIP PLACEMENT;  Surgeon: Daneil Dolin, MD;  [facility];  Service: Endoscopy;;   IR PERC TUN PERIT CATH WO PORT S&I /IMAG  09/15/2020   IR REMOVAL TUN CV CATH W/O FL  02/19/2021   IR US GUIDE VASC  ACCESS RIGHT  09/15/2020   POLYPECTOMY  04/24/2021   Procedure: POLYPECTOMY;  Surgeon: Eloise Harman, DO;  [facility];  Service: Endoscopy;;   POLYPECTOMY  05/14/2021   Procedure: POLYPECTOMY;  Surgeon: Daneil Dolin, MD;  [facility];  Service: Endoscopy;;   SKIN SPLIT GRAFT Bilateral 09/03/2021   Procedure: SKIN GRAFT BILATERAL LEGS;  Surgeon: Newt Minion, MD;  Location: [facility];  Service: Orthopedics;  Laterality: Bilateral;   SKIN SPLIT GRAFT Left 12/10/2021   Procedure: IRRIGATION AND DEBRIDEMENT LEFT CALF, APPLICATION SPLIT THICKNESS SKIN GRAFT;  Surgeon: Newt Minion, MD;  Location: [facility];  Service: Orthopedics;  Laterality: Left;   TOE SURGERY     Social History   Occupational History   Not on file  Tobacco Use   Smoking status: Never   Smokeless tobacco: Never  Vaping Use   Vaping Use: Never used  Substance and Sexual Activity   Alcohol use: No   Drug use: No   Sexual activity: Yes    Birth control/protection: Condom

## 2022-04-19 ENCOUNTER — Encounter: Payer: Medicare Other | Admitting: Orthopedic Surgery

## 2022-04-19 ENCOUNTER — Telehealth: Payer: Self-pay | Admitting: Family

## 2022-04-20 ENCOUNTER — Telehealth: Payer: Self-pay | Admitting: Orthopedic Surgery

## 2022-04-20 ENCOUNTER — Ambulatory Visit (INDEPENDENT_AMBULATORY_CARE_PROVIDER_SITE_OTHER): Payer: Medicare Other | Admitting: Family

## 2022-04-20 ENCOUNTER — Encounter: Payer: Self-pay | Admitting: Family

## 2022-04-20 DIAGNOSIS — L97911 Non-pressure chronic ulcer of unspecified part of right lower leg limited to breakdown of skin: Secondary | ICD-10-CM

## 2022-04-20 DIAGNOSIS — L97923 Non-pressure chronic ulcer of unspecified part of left lower leg with necrosis of muscle: Secondary | ICD-10-CM

## 2022-04-20 DIAGNOSIS — L98491 Non-pressure chronic ulcer of skin of other sites limited to breakdown of skin: Secondary | ICD-10-CM

## 2022-04-20 NOTE — Telephone Encounter (Signed)
[employer] forms received. To [employer]. 

## 2022-04-20 NOTE — Progress Notes (Signed)
Post-Op Visit Note   Patient: Amy Moses           Date of Birth: 04-30-1971           MRN: [id number] Visit Date: 04/20/2022 PCP: Emelda Fear, DO  Chief Complaint:  Chief Complaint  Patient presents with   Right Leg - Routine Post Op   Left Leg - Follow-up    HPI:  HPI The patient is a 51 year old woman who presents status post fifth foot ray amputation on the right as well as skin grafting for calciphylaxis bilateral posterior calves.  The right calf is nearly healed the left calf continues to heal slowly.  We have been applying dry dressings with Ace wrap to be right  Ortho Exam The on examination of the right lower extremity there is improved granulation tissue in the wound bed of the ray amputation which unfortunately did dehisce this is open about 15 mm in width filled in with 50% granulation 50% fibrinous exudative tissue there is no active drainage there is some surrounding epiboly and maceration the posterior calf is nearly healed.  On examination of the left lower extremity the calf wound is flat with good granulation tissue no surrounding erythema no odor no active drainage there is 25% fibrinous exudate tissue in this wound.  Please see attached photo.      Visit Diagnoses: No diagnosis found.  Plan: She will continue with compression wrapping of the left lower extremity dry dressing changes to the right foot and calf we will see her back in about 2 weeks.  She will continue with her home health wound care assistance.  Follow-Up Instructions: Return in about 2 weeks (around 05/04/2022).   Imaging: No results found.  Orders:  No orders of the defined types were placed in this encounter.  No orders of the defined types were placed in this encounter.    PMFS History: Patient Active Problem List   Diagnosis Date Noted   Non-adherence to medical treatment 03/16/2022   Cutaneous abscess of right foot    Osteomyelitis of foot ([place]) 02/14/2022   Obesity  (BMI 30-39.9) 01/26/2022   Ischemic ulcer of right foot ([place]) 01/24/2022   Normocytic anemia 01/24/2022   Sacral pressure ulcer 90/24/0973   Metabolic acidosis, increased anion gap 01/24/2022   Leg wound, left, sequela 12/10/2021   Open leg wound 09/03/2021   Wound infection    Non-pressure chronic ulcer of right calf limited to breakdown of skin ([place])    Calciphylaxis of right lower extremity with nonhealing ulcer, limited to breakdown of skin ([place])    Chronic ulcer of left thigh ([place]) 08/07/2021   Calciphylaxis of left lower extremity with nonhealing ulcer with necrosis of muscle ([place]) 08/07/2021   Lower GI bleed 05/12/2021   Acute GI bleeding 04/24/2021   Acute blood loss anemia 04/23/2021   GI bleed 04/22/2021   Fever 03/31/2021   Acute on chronic heart failure with preserved ejection fraction (HFpEF) ([place]) 03/30/2021   End stage renal disease ([place]) 11/16/2020   Calciphylaxis 11/06/2020   Non-healing open wound of heel 11/03/2020   Diabetic foot infection ([place]) 11/01/2020   Decubitus ulcer, heel 11/01/2020   Closed nondisplaced fracture of left patella 53/29/9242   Metabolic acidosis 68/34/1962   Acute on chronic renal failure ([place]) 06/10/2020   Acute pericardial effusion 06/10/2020   Chronic kidney disease, stage 4 (severe) ([place]) 03/05/2019   Vitamin D deficiency 01/28/2019   Type 2 diabetes mellitus with ESRD (end-stage renal disease) ([place])  09/21/2015   Hyperlipidemia 09/21/2015   Essential hypertension, benign 09/21/2015   Hypothyroidism 09/21/2015   Iris bomb 07/31/2012   Secondary angle-closure glaucoma 07/31/2012   Past Medical History:  Diagnosis Date   Anemia    Blindness of right eye with low vision in contralateral eye    s/p victrectomy   Diabetes mellitus, type II ([place])    Dyslipidemia    Glaucoma    History of blood transfusion    Hypertension    Hypothyroidism (acquired)    Kidney disease    Stage 5   Pneumonia     Family History  Problem  Relation Age of Onset   Heart disease Mother    Diabetes Mother    Kidney disease Mother    Diabetes Father    Heart disease Father    Diabetes Brother    Colon cancer Neg Hx     Past Surgical History:  Procedure Laterality Date   ABDOMINAL AORTOGRAM W/LOWER EXTREMITY Bilateral 12/18/2020   Procedure: ABDOMINAL AORTOGRAM W/LOWER EXTREMITY;  Surgeon: Elam Dutch, MD;  Location: [facility];  Service: Cardiovascular;  Laterality: Bilateral;   ABDOMINAL AORTOGRAM W/LOWER EXTREMITY Bilateral 01/25/2022   Procedure: ABDOMINAL AORTOGRAM W/LOWER EXTREMITY;  Surgeon: Serafina Mitchell, MD;  Location: [facility];  Service: Cardiovascular;  Laterality: Bilateral;   AMPUTATION Right 02/16/2022   Procedure: RIGHT FOOT 5TH RAY AMPUTATION;  Surgeon: Newt Minion, MD;  Location: [facility];  Service: Orthopedics;  Laterality: Right;   ANKLE FRACTURE SURGERY Right    AV FISTULA PLACEMENT Left 08/18/2020   Procedure: LEFT ARM BRACHIOCEPHALIC ARTERIOVENOUS (AV) FISTULA CREATION;  Surgeon: Elam Dutch, MD;  Location: [facility];  Service: Vascular;  Laterality: Left;   BIOPSY  04/24/2021   Procedure: BIOPSY;  Surgeon: Eloise Harman, DO;  [facility];  Service: Endoscopy;;   CESAREAN SECTION     CHOLECYSTECTOMY     COLONOSCOPY  04/24/2021   Surgeon: Eloise Harman, DO;  nonbleeding internal hemorrhoids, 1 large (25 mm) pedunculated transverse colon polyp (prolapse type polyp) with adherent clot and stigmata of recent bleed.   COLONOSCOPY WITH PROPOFOL N/A 05/14/2021   Procedure: COLONOSCOPY WITH PROPOFOL;  Surgeon: Daneil Dolin, MD;  [facility];  Service: Endoscopy;  Laterality: N/A;   ESOPHAGOGASTRODUODENOSCOPY (EGD) WITH PROPOFOL N/A 04/24/2021   Surgeon: Eloise Harman, DO;  duodenal erosions and gastritis biopsied (pathology with peptic duodenitis, reactive gastropathy with erosions/chronic inflammation, negative for H. pylori)   EYE SURGERY      Vatrectomy   HEMOSTASIS CLIP PLACEMENT  05/14/2021   Procedure: HEMOSTASIS CLIP PLACEMENT;  Surgeon: Daneil Dolin, MD;  [facility];  Service: Endoscopy;;   IR PERC TUN PERIT CATH WO PORT S&I /IMAG  09/15/2020   IR REMOVAL TUN CV CATH W/O FL  02/19/2021   IR US GUIDE VASC ACCESS RIGHT  09/15/2020   POLYPECTOMY  04/24/2021   Procedure: POLYPECTOMY;  Surgeon: Eloise Harman, DO;  [facility];  Service: Endoscopy;;   POLYPECTOMY  05/14/2021   Procedure: POLYPECTOMY;  Surgeon: Daneil Dolin, MD;  [facility];  Service: Endoscopy;;   SKIN SPLIT GRAFT Bilateral 09/03/2021   Procedure: SKIN GRAFT BILATERAL LEGS;  Surgeon: Newt Minion, MD;  Location: [facility];  Service: Orthopedics;  Laterality: Bilateral;   SKIN SPLIT GRAFT Left 12/10/2021   Procedure: IRRIGATION AND DEBRIDEMENT LEFT CALF, APPLICATION SPLIT THICKNESS SKIN GRAFT;  Surgeon: [name],  Illene Regulus, MD;  Location: [facility];  Service: Orthopedics;  Laterality: Left;   TOE SURGERY     Social History   Occupational History   Not on file  Tobacco Use   Smoking status: Never   Smokeless tobacco: Never  Vaping Use   Vaping Use: Never used  Substance and Sexual Activity   Alcohol use: No   Drug use: No   Sexual activity: Yes    Birth control/protection: Condom

## 2022-04-25 ENCOUNTER — Ambulatory Visit (INDEPENDENT_AMBULATORY_CARE_PROVIDER_SITE_OTHER): Payer: Medicare Other | Admitting: Orthopedic Surgery

## 2022-04-25 ENCOUNTER — Encounter: Payer: Self-pay | Admitting: Orthopedic Surgery

## 2022-04-25 DIAGNOSIS — L97911 Non-pressure chronic ulcer of unspecified part of right lower leg limited to breakdown of skin: Secondary | ICD-10-CM | POA: Diagnosis not present

## 2022-04-25 DIAGNOSIS — L97923 Non-pressure chronic ulcer of unspecified part of left lower leg with necrosis of muscle: Secondary | ICD-10-CM

## 2022-04-25 DIAGNOSIS — I890 Lymphedema, not elsewhere classified: Secondary | ICD-10-CM

## 2022-04-25 NOTE — Progress Notes (Signed)
Office Visit Note   Patient: Amy Moses           Date of Birth: [birth year]           MRN: [id number] Visit Date: 04/25/2022              Requested by: Emelda Fear, [place] [address] PCP: Emelda Fear, DO  Chief Complaint  Patient presents with   Right Leg - Routine Post Op   Left Leg - Routine Post Op      HPI: Patient is a 51 year old woman who presents with chronic ulceration both lower extremities status post treatment for calciphylaxis wounds.  Patient is also status post fifth ray amputation on the right foot.  Assessment & Plan: Visit Diagnoses:  1. Calciphylaxis of left lower extremity with nonhealing ulcer with necrosis of muscle (HCC)   2. Calciphylaxis of right lower extremity with nonhealing ulcer, limited to breakdown of skin ([place])   3. Lymphedema     Plan: We will order lymphedema pumps.  Patient would benefit to promote wound healing.  Patient states she lost her Medicaid insurance has difficulty with transportation's so we will see her in the office every other Friday and have the home health nurse provide the compression wrap every other week at home.  Orders were written for the home health nursing compression wraps.  She will continue with the dressing changes to the right foot.  Follow-Up Instructions: Return in about 2 weeks (around 05/09/2022).   Ortho Exam  Patient is alert, oriented, no adenopathy, well-dressed, normal affect, normal respiratory effort. Examination the right lower extremity the calciphylaxis wounds have completely healed the foot wound is showing improved granulation tissue with filling in.  Patient most likely will eventually need Kerecis tissue graft to the lateral wound.  Left calf measures 1 x 6 cm with improved granulation tissue.  Patient has significant lymphatic insufficiency of both lower extremities with edema.  Left ankle is 27 cm in circumference left calf is 34 cm in circumference left thigh  63 cm in circumference.  Right ankle 24 cm in circumference right calf 41 cm in circumference and right thigh 62 cm in circumference.  Imaging: No results found. No images are attached to the encounter.  Labs: Lab Results  Component Value Date   HGBA1C 11.5 (H) 02/15/2022   HGBA1C 13.0 12/30/2021   HGBA1C 12.2 (H) 12/10/2021   ESRSEDRATE 34 (H) 02/14/2022   ESRSEDRATE 40 (H) 08/07/2021   CRP 7.0 (H) 02/14/2022   CRP 11.1 (H) 08/07/2021   REPTSTATUS 01/29/2022 FINAL 01/24/2022   GRAMSTAIN  07/08/2021    RARE WBC PRESENT,BOTH PMN AND MONONUCLEAR RARE GRAM POSITIVE RODS RARE GRAM POSITIVE COCCI IN PAIRS    CULT  01/24/2022    NO GROWTH 5 DAYS Performed at [facility], [place] [address], [place], [place] [address]    Doy Hutching CITROBACTER KOSERI 05/21/2021   LABORGA PROTEUS MIRABILIS 05/21/2021     Lab Results  Component Value Date   ALBUMIN 2.7 (L) 02/18/2022   ALBUMIN 2.4 (L) 02/17/2022   ALBUMIN 2.5 (L) 02/16/2022   PREALBUMIN 13.7 (L) 08/07/2021    Lab Results  Component Value Date   MG 2.1 05/14/2021   MG 2.8 (H) 05/11/2021   MG 2.3 04/23/2021   Lab Results  Component Value Date   VD25OH 36.9 12/30/2021    Lab Results  Component Value Date   PREALBUMIN 13.7 (L) 08/07/2021  Latest Ref Rng & Units 02/18/2022    3:17 AM 02/17/2022    2:02 AM 02/15/2022    6:41 AM  CBC EXTENDED  WBC 4.0 - 10.5 K/uL 8.1   7.9   7.1    RBC 3.87 - 5.11 MIL/uL 3.53   3.68   4.02    Hemoglobin 12.0 - 15.0 g/dL 9.6   10.1   11.0    HCT 36.0 - 46.0 % 31.1   32.3   35.0    Platelets 150 - 400 K/uL 206   209   227    NEUT# 1.7 - 7.7 K/uL  5.5     Lymph# 0.7 - 4.0 K/uL  1.2        There is no height or weight on file to calculate BMI.  Orders:  No orders of the defined types were placed in this encounter.  No orders of the defined types were placed in this encounter.    Procedures: No procedures performed  Clinical Data: No additional findings.  ROS:  All  other systems negative, except as noted in the HPI. Review of Systems  Objective: Vital Signs: LMP 08/22/2015 (Approximate)   Specialty Comments:  No specialty comments available.  PMFS History: Patient Active Problem List   Diagnosis Date Noted   Non-adherence to medical treatment 03/16/2022   Cutaneous abscess of right foot    Osteomyelitis of foot ([place]) 02/14/2022   Obesity (BMI 30-39.9) 01/26/2022   Ischemic ulcer of right foot ([place]) 01/24/2022   Normocytic anemia 01/24/2022   Sacral pressure ulcer 26/37/8588   Metabolic acidosis, increased anion gap 01/24/2022   Leg wound, left, sequela 12/10/2021   Open leg wound 09/03/2021   Wound infection    Non-pressure chronic ulcer of right calf limited to breakdown of skin ([place])    Calciphylaxis of right lower extremity with nonhealing ulcer, limited to breakdown of skin ([place])    Chronic ulcer of left thigh ([place]) 08/07/2021   Calciphylaxis of left lower extremity with nonhealing ulcer with necrosis of muscle ([place]) 08/07/2021   Lower GI bleed 05/12/2021   Acute GI bleeding 04/24/2021   Acute blood loss anemia 04/23/2021   GI bleed 04/22/2021   Fever 03/31/2021   Acute on chronic heart failure with preserved ejection fraction (HFpEF) ([place]) 03/30/2021   End stage renal disease ([place]) 11/16/2020   Calciphylaxis 11/06/2020   Non-healing open wound of heel 11/03/2020   Diabetic foot infection ([place]) 11/01/2020   Decubitus ulcer, heel 11/01/2020   Closed nondisplaced fracture of left patella 50/27/7412   Metabolic acidosis 87/86/7672   Acute on chronic renal failure ([place]) 06/10/2020   Acute pericardial effusion 06/10/2020   Chronic kidney disease, stage 4 (severe) ([place]) 03/05/2019   Vitamin D deficiency 01/28/2019   Type 2 diabetes mellitus with ESRD (end-stage renal disease) ([place]) 09/21/2015   Hyperlipidemia 09/21/2015   Essential hypertension, benign 09/21/2015   Hypothyroidism 09/21/2015   Iris bomb 07/31/2012   Secondary  angle-closure glaucoma 07/31/2012   Past Medical History:  Diagnosis Date   Anemia    Blindness of right eye with low vision in contralateral eye    s/p victrectomy   Diabetes mellitus, type II ([place])    Dyslipidemia    Glaucoma    History of blood transfusion    Hypertension    Hypothyroidism (acquired)    Kidney disease    Stage 5   Pneumonia     Family History  Problem Relation Age of Onset   Heart  disease Mother    Diabetes Mother    Kidney disease Mother    Diabetes Father    Heart disease Father    Diabetes Brother    Colon cancer Neg Hx     Past Surgical History:  Procedure Laterality Date   ABDOMINAL AORTOGRAM W/LOWER EXTREMITY Bilateral 12/18/2020   Procedure: ABDOMINAL AORTOGRAM W/LOWER EXTREMITY;  Surgeon: Elam Dutch, MD;  Location: [facility];  Service: Cardiovascular;  Laterality: Bilateral;   ABDOMINAL AORTOGRAM W/LOWER EXTREMITY Bilateral 01/25/2022   Procedure: ABDOMINAL AORTOGRAM W/LOWER EXTREMITY;  Surgeon: Serafina Mitchell, MD;  Location: [facility];  Service: Cardiovascular;  Laterality: Bilateral;   AMPUTATION Right 02/16/2022   Procedure: RIGHT FOOT 5TH RAY AMPUTATION;  Surgeon: Newt Minion, MD;  Location: [facility];  Service: Orthopedics;  Laterality: Right;   ANKLE FRACTURE SURGERY Right    AV FISTULA PLACEMENT Left 08/18/2020   Procedure: LEFT ARM BRACHIOCEPHALIC ARTERIOVENOUS (AV) FISTULA CREATION;  Surgeon: Elam Dutch, MD;  Location: [facility];  Service: Vascular;  Laterality: Left;   BIOPSY  04/24/2021   Procedure: BIOPSY;  Surgeon: Eloise Harman, DO;  [facility];  Service: Endoscopy;;   CESAREAN SECTION     CHOLECYSTECTOMY     COLONOSCOPY  04/24/2021   Surgeon: Eloise Harman, DO;  nonbleeding internal hemorrhoids, 1 large (25 mm) pedunculated transverse colon polyp (prolapse type polyp) with adherent clot and stigmata of recent bleed.   COLONOSCOPY WITH PROPOFOL N/A 05/14/2021   Procedure: COLONOSCOPY  WITH PROPOFOL;  Surgeon: Daneil Dolin, MD;  [facility];  Service: Endoscopy;  Laterality: N/A;   ESOPHAGOGASTRODUODENOSCOPY (EGD) WITH PROPOFOL N/A 04/24/2021   Surgeon: Eloise Harman, DO;  duodenal erosions and gastritis biopsied (pathology with peptic duodenitis, reactive gastropathy with erosions/chronic inflammation, negative for H. pylori)   EYE SURGERY     Vatrectomy   HEMOSTASIS CLIP PLACEMENT  05/14/2021   Procedure: HEMOSTASIS CLIP PLACEMENT;  Surgeon: Daneil Dolin, MD;  [facility];  Service: Endoscopy;;   IR PERC TUN PERIT CATH WO PORT S&I /IMAG  09/15/2020   IR REMOVAL TUN CV CATH W/O FL  02/19/2021   IR US GUIDE VASC ACCESS RIGHT  09/15/2020   POLYPECTOMY  04/24/2021   Procedure: POLYPECTOMY;  Surgeon: Eloise Harman, DO;  [facility];  Service: Endoscopy;;   POLYPECTOMY  05/14/2021   Procedure: POLYPECTOMY;  Surgeon: Daneil Dolin, MD;  [facility];  Service: Endoscopy;;   SKIN SPLIT GRAFT Bilateral 09/03/2021   Procedure: SKIN GRAFT BILATERAL LEGS;  Surgeon: Newt Minion, MD;  Location: [facility];  Service: Orthopedics;  Laterality: Bilateral;   SKIN SPLIT GRAFT Left 12/10/2021   Procedure: IRRIGATION AND DEBRIDEMENT LEFT CALF, APPLICATION SPLIT THICKNESS SKIN GRAFT;  Surgeon: Newt Minion, MD;  Location: [facility];  Service: Orthopedics;  Laterality: Left;   TOE SURGERY     Social History   Occupational History   Not on file  Tobacco Use   Smoking status: Never   Smokeless tobacco: Never  Vaping Use   Vaping Use: Never used  Substance and Sexual Activity   Alcohol use: No   Drug use: No   Sexual activity: Yes    Birth control/protection: Condom

## 2022-04-27 ENCOUNTER — Telehealth: Payer: Self-pay | Admitting: Orthopedic Surgery

## 2022-04-27 NOTE — Telephone Encounter (Signed)
Received medical records release form, $25.00 cash and fax sheet from patient/ Forwarding to [facility] today

## 2022-04-27 NOTE — Telephone Encounter (Signed)
Patient [place] called in stating she got written instruction on what to do with patient right foot but cannot read what [name] wrote I looked in the chart but was unsuccessful finding out please call and advise her number is [phone] her name is Amy Moses.

## 2022-04-28 ENCOUNTER — Other Ambulatory Visit: Payer: Self-pay

## 2022-04-28 ENCOUNTER — Emergency Department (HOSPITAL_COMMUNITY): Payer: Medicare Other

## 2022-04-28 ENCOUNTER — Encounter (HOSPITAL_COMMUNITY): Payer: Self-pay

## 2022-04-28 ENCOUNTER — Emergency Department (HOSPITAL_COMMUNITY)
Admission: EM | Admit: 2022-04-28 | Discharge: 2022-04-29 | Disposition: A | Discharge status: Home / Self Care | Payer: Medicare Other | Source: Home / Self Care | Attending: Emergency Medicine | Admitting: Emergency Medicine

## 2022-04-28 DIAGNOSIS — Z794 Long term (current) use of insulin: Secondary | ICD-10-CM | POA: Insufficient documentation

## 2022-04-28 DIAGNOSIS — R6883 Chills (without fever): Secondary | ICD-10-CM | POA: Insufficient documentation

## 2022-04-28 DIAGNOSIS — Z7982 Long term (current) use of aspirin: Secondary | ICD-10-CM | POA: Insufficient documentation

## 2022-04-28 DIAGNOSIS — R7881 Bacteremia: Secondary | ICD-10-CM | POA: Diagnosis not present

## 2022-04-28 DIAGNOSIS — R509 Fever, unspecified: Secondary | ICD-10-CM | POA: Diagnosis not present

## 2022-04-28 DIAGNOSIS — Z20822 Contact with and (suspected) exposure to covid-19: Secondary | ICD-10-CM | POA: Insufficient documentation

## 2022-04-28 LAB — CBC WITH DIFFERENTIAL/PLATELET
Abs Immature Granulocytes: 0.09 10*3/uL — ABNORMAL HIGH (ref 0.00–0.07)
Basophils Absolute: 0 10*3/uL (ref 0.0–0.1)
Basophils Relative: 0 %
Eosinophils Absolute: 0 10*3/uL (ref 0.0–0.5)
Eosinophils Relative: 0 %
HCT: 38.2 % (ref 36.0–46.0)
Hemoglobin: 11.6 g/dL — ABNORMAL LOW (ref 12.0–15.0)
Immature Granulocytes: 1 %
Lymphocytes Relative: 3 %
Lymphs Abs: 0.4 10*3/uL — ABNORMAL LOW (ref 0.7–4.0)
MCH: 28.9 pg (ref 26.0–34.0)
MCHC: 30.4 g/dL (ref 30.0–36.0)
MCV: 95 fL (ref 80.0–100.0)
Monocytes Absolute: 0.6 10*3/uL (ref 0.1–1.0)
Monocytes Relative: 5 %
Neutro Abs: 11.9 10*3/uL — ABNORMAL HIGH (ref 1.7–7.7)
Neutrophils Relative %: 91 %
Platelets: 154 10*3/uL (ref 150–400)
RBC: 4.02 MIL/uL (ref 3.87–5.11)
RDW: 15.1 % (ref 11.5–15.5)
WBC: 13.1 10*3/uL — ABNORMAL HIGH (ref 4.0–10.5)
nRBC: 0 % (ref 0.0–0.2)

## 2022-04-28 NOTE — ED Triage Notes (Signed)
Pt reports body aches and chills that started this morning during dialysis. Pt oral temp is 100.6 in triage.

## 2022-04-28 NOTE — Telephone Encounter (Signed)
Called again, message prompts, "user is not expecting messages at this time, please try your call again".

## 2022-04-28 NOTE — Telephone Encounter (Signed)
Called [name] at # provided below. No answer and no option to leave a VM. Will try back later.

## 2022-04-29 ENCOUNTER — Emergency Department (HOSPITAL_COMMUNITY): Payer: Medicare Other

## 2022-04-29 ENCOUNTER — Other Ambulatory Visit: Payer: Self-pay

## 2022-04-29 ENCOUNTER — Inpatient Hospital Stay (HOSPITAL_COMMUNITY)
Admission: EM | Admit: 2022-04-29 | Discharge: 2022-05-03 | DRG: 871 | Disposition: A | Discharge status: Home Health | Payer: Medicare Other | Attending: Internal Medicine | Admitting: Internal Medicine

## 2022-04-29 ENCOUNTER — Encounter (HOSPITAL_COMMUNITY): Payer: Self-pay | Admitting: Emergency Medicine

## 2022-04-29 DIAGNOSIS — E039 Hypothyroidism, unspecified: Secondary | ICD-10-CM

## 2022-04-29 DIAGNOSIS — Z79890 Hormone replacement therapy (postmenopausal): Secondary | ICD-10-CM

## 2022-04-29 DIAGNOSIS — D631 Anemia in chronic kidney disease: Secondary | ICD-10-CM | POA: Diagnosis present

## 2022-04-29 DIAGNOSIS — E1165 Type 2 diabetes mellitus with hyperglycemia: Secondary | ICD-10-CM | POA: Diagnosis present

## 2022-04-29 DIAGNOSIS — N2581 Secondary hyperparathyroidism of renal origin: Secondary | ICD-10-CM | POA: Diagnosis present

## 2022-04-29 DIAGNOSIS — E1169 Type 2 diabetes mellitus with other specified complication: Secondary | ICD-10-CM | POA: Diagnosis present

## 2022-04-29 DIAGNOSIS — S91309A Unspecified open wound, unspecified foot, initial encounter: Secondary | ICD-10-CM | POA: Diagnosis present

## 2022-04-29 DIAGNOSIS — M86671 Other chronic osteomyelitis, right ankle and foot: Secondary | ICD-10-CM | POA: Diagnosis present

## 2022-04-29 DIAGNOSIS — Z888 Allergy status to other drugs, medicaments and biological substances status: Secondary | ICD-10-CM | POA: Diagnosis not present

## 2022-04-29 DIAGNOSIS — Z6834 Body mass index (BMI) 34.0-34.9, adult: Secondary | ICD-10-CM

## 2022-04-29 DIAGNOSIS — Z992 Dependence on renal dialysis: Secondary | ICD-10-CM

## 2022-04-29 DIAGNOSIS — E871 Hypo-osmolality and hyponatremia: Secondary | ICD-10-CM | POA: Diagnosis present

## 2022-04-29 DIAGNOSIS — H5461 Unqualified visual loss, right eye, normal vision left eye: Secondary | ICD-10-CM | POA: Diagnosis present

## 2022-04-29 DIAGNOSIS — E782 Mixed hyperlipidemia: Secondary | ICD-10-CM | POA: Diagnosis not present

## 2022-04-29 DIAGNOSIS — I120 Hypertensive chronic kidney disease with stage 5 chronic kidney disease or end stage renal disease: Secondary | ICD-10-CM | POA: Diagnosis present

## 2022-04-29 DIAGNOSIS — M898X9 Other specified disorders of bone, unspecified site: Secondary | ICD-10-CM | POA: Diagnosis present

## 2022-04-29 DIAGNOSIS — Z794 Long term (current) use of insulin: Secondary | ICD-10-CM

## 2022-04-29 DIAGNOSIS — K219 Gastro-esophageal reflux disease without esophagitis: Secondary | ICD-10-CM | POA: Diagnosis not present

## 2022-04-29 DIAGNOSIS — S91302D Unspecified open wound, left foot, subsequent encounter: Secondary | ICD-10-CM | POA: Diagnosis not present

## 2022-04-29 DIAGNOSIS — Z833 Family history of diabetes mellitus: Secondary | ICD-10-CM | POA: Diagnosis not present

## 2022-04-29 DIAGNOSIS — Z8249 Family history of ischemic heart disease and other diseases of the circulatory system: Secondary | ICD-10-CM | POA: Diagnosis not present

## 2022-04-29 DIAGNOSIS — R7881 Bacteremia: Secondary | ICD-10-CM | POA: Diagnosis not present

## 2022-04-29 DIAGNOSIS — Z79899 Other long term (current) drug therapy: Secondary | ICD-10-CM

## 2022-04-29 DIAGNOSIS — N186 End stage renal disease: Secondary | ICD-10-CM | POA: Diagnosis present

## 2022-04-29 DIAGNOSIS — Z7982 Long term (current) use of aspirin: Secondary | ICD-10-CM

## 2022-04-29 DIAGNOSIS — E1122 Type 2 diabetes mellitus with diabetic chronic kidney disease: Secondary | ICD-10-CM | POA: Diagnosis not present

## 2022-04-29 DIAGNOSIS — Z20822 Contact with and (suspected) exposure to covid-19: Secondary | ICD-10-CM | POA: Diagnosis present

## 2022-04-29 DIAGNOSIS — I10 Essential (primary) hypertension: Secondary | ICD-10-CM | POA: Diagnosis not present

## 2022-04-29 DIAGNOSIS — I429 Cardiomyopathy, unspecified: Secondary | ICD-10-CM | POA: Diagnosis present

## 2022-04-29 DIAGNOSIS — B9689 Other specified bacterial agents as the cause of diseases classified elsewhere: Secondary | ICD-10-CM | POA: Diagnosis present

## 2022-04-29 DIAGNOSIS — R509 Fever, unspecified: Secondary | ICD-10-CM | POA: Diagnosis present

## 2022-04-29 DIAGNOSIS — E669 Obesity, unspecified: Secondary | ICD-10-CM | POA: Diagnosis not present

## 2022-04-29 DIAGNOSIS — Z841 Family history of disorders of kidney and ureter: Secondary | ICD-10-CM

## 2022-04-29 DIAGNOSIS — E119 Type 2 diabetes mellitus without complications: Secondary | ICD-10-CM | POA: Diagnosis present

## 2022-04-29 LAB — PROTIME-INR
INR: 1.2 (ref 0.8–1.2)
Prothrombin Time: 15.2 seconds (ref 11.4–15.2)

## 2022-04-29 LAB — BLOOD CULTURE ID PANEL (REFLEXED) - BCID2

## 2022-04-29 LAB — COMPREHENSIVE METABOLIC PANEL
ALT: 13 U/L (ref 0–44)
ALT: 15 U/L (ref 0–44)
AST: 14 U/L — ABNORMAL LOW (ref 15–41)
AST: 15 U/L (ref 15–41)
Albumin: 3.5 g/dL (ref 3.5–5.0)
Albumin: 3.7 g/dL (ref 3.5–5.0)
Alkaline Phosphatase: 114 U/L (ref 38–126)
Alkaline Phosphatase: 121 U/L (ref 38–126)
Anion gap: 10 (ref 5–15)
Anion gap: 11 (ref 5–15)
BUN: 35 mg/dL — ABNORMAL HIGH (ref 6–20)
BUN: 43 mg/dL — ABNORMAL HIGH (ref 6–20)
CO2: 26 mmol/L (ref 22–32)
CO2: 26 mmol/L (ref 22–32)
Calcium: 9 mg/dL (ref 8.9–10.3)
Calcium: 9.2 mg/dL (ref 8.9–10.3)
Chloride: 100 mmol/L (ref 98–111)
Chloride: 98 mmol/L (ref 98–111)
Creatinine, Ser: 5.12 mg/dL — ABNORMAL HIGH (ref 0.44–1.00)
Creatinine, Ser: 6.2 mg/dL — ABNORMAL HIGH (ref 0.44–1.00)
GFR, Estimated: 10 mL/min — ABNORMAL LOW (ref >60)
GFR, Estimated: 8 mL/min — ABNORMAL LOW (ref >60)
Glucose, Bld: 146 mg/dL — ABNORMAL HIGH (ref 70–99)
Glucose, Bld: 243 mg/dL — ABNORMAL HIGH (ref 70–99)
Potassium: 4.8 mmol/L (ref 3.5–5.1)
Potassium: 5 mmol/L (ref 3.5–5.1)
Sodium: 134 mmol/L — ABNORMAL LOW (ref 135–145)
Sodium: 137 mmol/L (ref 135–145)
Total Bilirubin: 0.8 mg/dL (ref 0.3–1.2)
Total Bilirubin: 1.2 mg/dL (ref 0.3–1.2)
Total Protein: 7 g/dL (ref 6.5–8.1)
Total Protein: 7 g/dL (ref 6.5–8.1)

## 2022-04-29 LAB — CBC WITH DIFFERENTIAL/PLATELET
Abs Immature Granulocytes: 0.05 10*3/uL (ref 0.00–0.07)
Basophils Absolute: 0 10*3/uL (ref 0.0–0.1)
Basophils Relative: 0 %
Eosinophils Absolute: 0.1 10*3/uL (ref 0.0–0.5)
Eosinophils Relative: 1 %
HCT: 36.5 % (ref 36.0–46.0)
Hemoglobin: 11.2 g/dL — ABNORMAL LOW (ref 12.0–15.0)
Immature Granulocytes: 1 %
Lymphocytes Relative: 10 %
Lymphs Abs: 0.9 10*3/uL (ref 0.7–4.0)
MCH: 29 pg (ref 26.0–34.0)
MCHC: 30.7 g/dL (ref 30.0–36.0)
MCV: 94.6 fL (ref 80.0–100.0)
Monocytes Absolute: 0.4 10*3/uL (ref 0.1–1.0)
Monocytes Relative: 5 %
Neutro Abs: 7.5 10*3/uL (ref 1.7–7.7)
Neutrophils Relative %: 83 %
Platelets: 157 10*3/uL (ref 150–400)
RBC: 3.86 MIL/uL — ABNORMAL LOW (ref 3.87–5.11)
RDW: 15.4 % (ref 11.5–15.5)
WBC: 9 10*3/uL (ref 4.0–10.5)
nRBC: 0 % (ref 0.0–0.2)

## 2022-04-29 LAB — RESP PANEL BY RT-PCR (FLU A&B, COVID) ARPGX2
Influenza A by PCR: NEGATIVE
Influenza B by PCR: NEGATIVE
SARS Coronavirus 2 by RT PCR: NEGATIVE

## 2022-04-29 LAB — LACTIC ACID, PLASMA: Lactic Acid, Venous: 1.2 mmol/L (ref 0.5–1.9)

## 2022-04-29 LAB — APTT: aPTT: 34 seconds (ref 24–36)

## 2022-04-29 LAB — SARS CORONAVIRUS 2 BY RT PCR: SARS Coronavirus 2 by RT PCR: NEGATIVE

## 2022-04-29 MED ORDER — SODIUM CHLORIDE 0.9 % IV SOLN: 2.0000 g | INTRAVENOUS | Status: DC

## 2022-04-29 MED ORDER — SODIUM CHLORIDE 0.9 % IV BOLUS
1000.0000 mL | Freq: Once | INTRAVENOUS | Status: AC
  Administered 2022-04-29: 1000 mL via INTRAVENOUS

## 2022-04-29 MED ORDER — CEFEPIME HCL 2 G IV SOLR
2.0000 g | INTRAVENOUS | Status: DC
Start: 2022-04-30 — End: 2022-04-29

## 2022-04-29 MED ORDER — LACTATED RINGERS IV BOLUS: 1000.0000 mL | Freq: Once | INTRAVENOUS | Status: DC

## 2022-04-29 MED ORDER — ACETAMINOPHEN 325 MG PO TABS
650.0000 mg | ORAL_TABLET | Freq: Once | ORAL | Status: AC
Start: 2022-04-29 — End: 2022-04-29
  Administered 2022-04-29: 650 mg via ORAL
  Filled 2022-04-29: qty 2

## 2022-04-29 MED ORDER — SODIUM CHLORIDE 0.9 % IV SOLN
1.0000 g | INTRAVENOUS | Status: DC
  Filled 2022-04-29: qty 10

## 2022-04-29 MED ORDER — LACTATED RINGERS IV SOLN: INTRAVENOUS | Status: DC

## 2022-04-29 MED ORDER — SODIUM CHLORIDE 0.9 % IV SOLN
2.0000 g | Freq: Once | INTRAVENOUS | Status: AC
  Administered 2022-04-29: 2 g via INTRAVENOUS

## 2022-04-29 MED ORDER — SODIUM CHLORIDE 0.9 % IV SOLN: 2.0000 g | Freq: Once | INTRAVENOUS | Status: DC

## 2022-04-29 NOTE — ED Provider Notes (Signed)
[facility] EMERGENCY DEPARTMENT Provider Note   CSN: [id number] Arrival date & time: 04/29/22  2049     History  Chief Complaint  Patient presents with   Abnormal Lab    Amy Moses is a 51 y.o. female.   Abnormal Lab   Patient with medical history of end-stage renal disease dialysis Tuesday, Thursday Saturday, chronic right eye blindness, anemia, hypertension, hypothyroid presents today due to fever/positive blood cultures.  Patient was seen yesterday in the ED due to fever at home and chills.  Was worked up for fever of unknown origin, discharged home.  Blood cultures were notable for growth of 2 different species and told to come back for IV antibiotics.  Patient's last dialysis was Thursday, she had this quite 43 minutes early because she was feeling weak.  She denies any chest pain, shortness of breath, abdominal pain, nausea, vomiting, diarrhea.  She has chronic osteomyelitis and is followed by Dr. [name] who she saw 4 days ago.  Home Medications Prior to Admission medications   Medication Sig Start Date End Date Taking? Authorizing Provider  acetaminophen (TYLENOL) 500 MG tablet Take 1,000 mg by mouth every 6 (six) hours as needed for moderate pain.    [provider]  ascorbic acid (VITAMIN C) 1000 MG tablet Take 1 tablet (1,000 mg total) by mouth daily. Patient not taking: Reported on 03/16/2022 02/19/22   Nita Sells, MD  aspirin EC 81 MG tablet Take 1 tablet (81 mg total) by mouth daily with breakfast. 05/19/21 05/19/22  Irwin Brakeman L, MD  atorvastatin (LIPITOR) 10 MG tablet Take 1 tablet (10 mg total) by mouth daily. Patient taking differently: Take 10 mg by mouth at bedtime. 04/25/21   Roxan Hockey, MD  AURYXIA 1 GM 210 MG(Fe) tablet Take 420 mg by mouth 2 (two) times daily with a meal. 01/07/22   [provider]  Blood Glucose Monitoring Suppl (ACCU-CHEK GUIDE ME) w/Device KIT 1 Piece by Does not apply route as directed. 03/16/22   Cassandria Anger, MD  Continuous Blood Gluc Receiver (FREESTYLE LIBRE 2 READER) DEVI As directed 04/06/22   Cassandria Anger, MD  Continuous Blood Gluc Sensor (FREESTYLE LIBRE 2 SENSOR) MISC 1 Piece by Does not apply route every 14 (fourteen) days. 04/06/22   Cassandria Anger, MD  glucose blood (ACCU-CHEK GUIDE) test strip Use as instructed 03/16/22   Cassandria Anger, MD  HUMALOG KWIKPEN 100 UNIT/ML KwikPen Inject 5-11 Units into the skin 3 (three) times daily with meals. If eats 50% or more of meal. 03/16/22   Nida, Marella Chimes, MD  insulin glargine (LANTUS) 100 UNIT/ML injection Inject 0.2 mLs (20 Units total) into the skin at bedtime. 04/06/22   Cassandria Anger, MD  lactulose (CHRONULAC) 10 GM/15ML solution Take 10 g by mouth daily as needed for moderate constipation. 11/30/21   [provider]  levothyroxine (SYNTHROID) 50 MCG tablet Take 1 tablet (50 mcg total) by mouth daily before breakfast. 04/25/21   Denton Brick, Courage, MD  pantoprazole (PROTONIX) 40 MG tablet Take 1 tablet (40 mg total) by mouth 2 (two) times daily. 04/25/21 04/25/22  Roxan Hockey, MD  pentoxifylline (TRENTAL) 400 MG CR tablet Take 1 tablet (400 mg total) by mouth 3 (three) times daily with meals. 01/26/22   Raiford Noble Latif, DO  polyethylene glycol (MIRALAX / GLYCOLAX) 17 g packet Take 17 g by mouth daily as needed for mild constipation. 02/19/22   Nita Sells, MD  silver sulfADIAZINE (SILVADENE) 1 %  cream Apply 1 application. topically daily. Apply to affected area daily plus dry dressing 02/24/22   Newt Minion, MD  torsemide (DEMADEX) 100 MG tablet Take 150 mg by mouth daily.  05/22/20   [provider]  traMADol (ULTRAM) 50 MG tablet Take 1 tablet (50 mg total) by mouth every 6 (six) hours as needed. 02/19/22 02/19/23  Nita Sells, MD  Vitamin D, Ergocalciferol, (DRISDOL) 1.25 MG (50000 UNIT) CAPS capsule Take 50,000 Units by mouth every 14 (fourteen) days. 04/20/21   [provider]      Allergies    Ace inhibitors    Review of Systems   Review of Systems  Physical Exam Updated Vital Signs BP 99/60   Pulse 80   Temp 99.8 F (37.7 C) (Oral)   Resp 18   Ht _0  (1.676 m)   Wt 97.5 kg   LMP 08/22/2015 (Approximate)   SpO2 98%   BMI 34.69 kg/m  Physical Exam Vitals and nursing note reviewed. Exam conducted with a chaperone present.  Constitutional:      Appearance: Normal appearance.  HENT:     Head: Normocephalic and atraumatic.  Eyes:     General: No scleral icterus.       Right eye: No discharge.        Left eye: No discharge.     Extraocular Movements: Extraocular movements intact.     Pupils: Pupils are equal, round, and reactive to light.  Cardiovascular:     Rate and Rhythm: Normal rate and regular rhythm.     Pulses: Normal pulses.     Heart sounds: Normal heart sounds. No murmur heard.    No friction rub. No gallop.     Comments: AV fistula with palpable thrill Pulmonary:     Effort: Pulmonary effort is normal. No respiratory distress.     Breath sounds: Normal breath sounds.  Abdominal:     General: Abdomen is flat. Bowel sounds are normal. There is no distension.     Palpations: Abdomen is soft.     Tenderness: There is no abdominal tenderness.  Skin:    General: Skin is warm and dry.     Coloration: Skin is not jaundiced.     Comments: Calciphylaxis  Neurological:     Mental Status: She is alert. Mental status is at baseline.     Coordination: Coordination normal.     ED Results / Procedures / Treatments   Labs (all labs ordered are listed, but only abnormal results are displayed) Labs Reviewed  COMPREHENSIVE METABOLIC PANEL - Abnormal; Notable for the following components:      Result Value   Sodium 134 (*)    Glucose, Bld 146 (*)    BUN 43 (*)    Creatinine, Ser 6.20 (*)    GFR, Estimated 8 (*)    All other components within normal limits  CBC WITH DIFFERENTIAL/PLATELET - Abnormal; Notable for the  following components:   RBC 3.86 (*)    Hemoglobin 11.2 (*)    All other components within normal limits  CULTURE, BLOOD (ROUTINE X 2)  RESP PANEL BY RT-PCR (FLU A&B, COVID) ARPGX2  CULTURE, BLOOD (ROUTINE X 2)  LACTIC ACID, PLASMA  PROTIME-INR  APTT  LACTIC ACID, PLASMA  POC URINE PREG, ED    EKG None  Radiology DG Chest Port 1 View  Result Date: 04/29/2022 CLINICAL DATA:  Questionable sepsis - evaluate for abnormality EXAM: PORTABLE CHEST 1 VIEW COMPARISON:  04/29/2022 FINDINGS: Cardiomegaly.  No overt edema, confluent opacities or effusions. No acute bony abnormality. IMPRESSION: Cardiomegaly.  No active disease. Electronically Signed   By: Rolm Baptise M.D.   On: 04/29/2022 23:04   DG Chest Portable 1 View  Result Date: 04/29/2022 CLINICAL DATA:  Body aches and chills for several hours, initial encounter EXAM: PORTABLE CHEST 1 VIEW COMPARISON:  03/30/2021 FINDINGS: Cardiac shadow is enlarged but stable. Aortic calcifications are seen. Lungs are well aerated bilaterally without focal infiltrate or effusion. Mild increased bronchitic markings are noted. Degenerative changes of the shoulder joints are seen. IMPRESSION: Mild increased bronchitic markings are noted without focal confluent infiltrate. No other focal abnormality is noted. Electronically Signed   By: Inez Catalina M.D.   On: 04/29/2022 00:29    Procedures .Critical Care  Performed by: Sherrill Raring, PA-C Authorized by: Sherrill Raring, PA-C   Critical care provider statement:    Critical care time (minutes):  30   Critical care start time:  04/29/2022 10:30 PM   Critical care end time:  04/29/2022 11:00 PM   Critical care was necessary to treat or prevent imminent or life-threatening deterioration of the following conditions:  Sepsis   Critical care was time spent personally by me on the following activities:  Development of treatment plan with patient or surrogate, discussions with consultants, evaluation of patient's response to  treatment, examination of patient, ordering and review of laboratory studies, ordering and review of radiographic studies, ordering and performing treatments and interventions, pulse oximetry, re-evaluation of patient's condition and review of old charts     Medications Ordered in ED Medications  ceFEPIme (MAXIPIME) 1 g in sodium chloride 0.9 % 100 mL IVPB (has no administration in time range)  sodium chloride 0.9 % bolus 1,000 mL (1,000 mLs Intravenous New Bag/Given 04/29/22 2326)    ED Course/ Medical Decision Making/ A&P                           Medical Decision Making Amount and/or Complexity of Data Reviewed Labs: ordered. Radiology: ordered. ECG/medicine tests: ordered.  Risk Prescription drug management. Decision regarding hospitalization.   Patient is a 51 year old presenting with positive blood cultures.  Patient has bacteremia with unclear source.   patient does not make urine.  I reviewed laboratory work-up from yesterday, patient had leukocytosis of 13.1.  Stable anemia with hemoglobin of 11.6.  She was hyperglycemic at 243 and creatinine was elevated at 5.1 she which is roughly around her baseline.Chest x-ray was without any underlying pneumonia.  Patient does have history of chronic osteomyelitis, followed by Dr. [name].  I reviewed the note from that record as well as ED visit from yesterday.  I spoke with pharmacy, they recommend cefepime for coverage of bacterial species 1 g due to patient's end-stage renal disease.    I reviewed EKG, patient is in sinus rhythm.  Patient was kept on cardiac monitoring which I viewed and is also in sinus rhythm.  I ordered and reviewed repeat chest x-ray portable.  Chest x-ray showed cardiomegaly without any acute process.  I agree with radiologist interpretation.  I repeated lab work.  Patient is not having a high lactic acidosis.  PT/INR within normal limits.  Stable anemia again with a hemoglobin 11.2.  I suspect the bacteremia is  secondary to [place] course.  She does have a history of chronic osteomyelitis, additionally she does have nonhealing ulcers which could have been the source of the bacteremia..  I will  consult hospitalist for admission.  Discussed HPI, physical exam and plan of care for this patient with attending [facility]. The attending physician evaluated this patient as part of a shared visit and agrees with plan of care.         Final Clinical Impression(s) / ED Diagnoses Final diagnoses:  Bacteremia    Rx / DC Orders ED Discharge Orders     None         Sherrill Raring, Hershal Coria 04/29/22 2335    Luna Fuse, MD 05/09/22 [phone]

## 2022-04-29 NOTE — ED Triage Notes (Signed)
Pt was seen last night and was called to come back today due to positive blood cultures.

## 2022-04-29 NOTE — H&P (Incomplete)
History and Physical    Patient: Amy Moses ZOX:[id number] DOB: 1971-10-19 DOA: 04/29/2022 DOS: the patient was seen and examined on 04/29/2022 PCP: Emelda Fear, DO  Patient coming from: {Point_of_Origin:[address]}  Chief Complaint:  Chief Complaint  Patient presents with   Abnormal Lab   HPI: Amy Moses is a 51 y.o. female with medical history significant of ***  Review of Systems: {ROS_Text:26778} Past Medical History:  Diagnosis Date   Anemia    Blindness of right eye with low vision in contralateral eye    s/p victrectomy   Diabetes mellitus, type II ([place])    Dyslipidemia    Glaucoma    History of blood transfusion    Hypertension    Hypothyroidism (acquired)    Kidney disease    Stage 5   Pneumonia    Past Surgical History:  Procedure Laterality Date   ABDOMINAL AORTOGRAM W/LOWER EXTREMITY Bilateral 12/18/2020   Procedure: ABDOMINAL AORTOGRAM W/LOWER EXTREMITY;  Surgeon: Elam Dutch, MD;  Location: [facility];  Service: Cardiovascular;  Laterality: Bilateral;   ABDOMINAL AORTOGRAM W/LOWER EXTREMITY Bilateral 01/25/2022   Procedure: ABDOMINAL AORTOGRAM W/LOWER EXTREMITY;  Surgeon: Serafina Mitchell, MD;  Location: [facility];  Service: Cardiovascular;  Laterality: Bilateral;   AMPUTATION Right 02/16/2022   Procedure: RIGHT FOOT 5TH RAY AMPUTATION;  Surgeon: Newt Minion, MD;  Location: [place];  Service: Orthopedics;  Laterality: Right;   ANKLE FRACTURE SURGERY Right    AV FISTULA PLACEMENT Left 08/18/2020   Procedure: LEFT ARM BRACHIOCEPHALIC ARTERIOVENOUS (AV) FISTULA CREATION;  Surgeon: Elam Dutch, MD;  Location: [place];  Service: Vascular;  Laterality: Left;   BIOPSY  04/24/2021   Procedure: BIOPSY;  Surgeon: Eloise Harman, DO;  [facility]: [facility];  Service: Endoscopy;;   CESAREAN SECTION     CHOLECYSTECTOMY     COLONOSCOPY  04/24/2021   Surgeon: Eloise Harman, DO;  nonbleeding internal hemorrhoids, 1 large (25 mm)  pedunculated transverse colon polyp (prolapse type polyp) with adherent clot and stigmata of recent bleed.   COLONOSCOPY WITH PROPOFOL N/A 05/14/2021   Procedure: COLONOSCOPY WITH PROPOFOL;  Surgeon: Daneil Dolin, MD;  Location: [facility];  Service: Endoscopy;  Laterality: N/A;   ESOPHAGOGASTRODUODENOSCOPY (EGD) WITH PROPOFOL N/A 04/24/2021   Surgeon: Eloise Harman, DO;  duodenal erosions and gastritis biopsied (pathology with peptic duodenitis, reactive gastropathy with erosions/chronic inflammation, negative for H. pylori)   EYE SURGERY     Vatrectomy   HEMOSTASIS CLIP PLACEMENT  05/14/2021   Procedure: HEMOSTASIS CLIP PLACEMENT;  Surgeon: Daneil Dolin, MD;  [facility]: [facility];  Service: Endoscopy;;   IR PERC TUN PERIT CATH WO PORT S&I /IMAG  09/15/2020   IR REMOVAL TUN CV CATH W/O FL  02/19/2021   IR US GUIDE VASC ACCESS RIGHT  09/15/2020   POLYPECTOMY  04/24/2021   Procedure: POLYPECTOMY;  Surgeon: Eloise Harman, DO;  [facility];  Service: Endoscopy;;   POLYPECTOMY  05/14/2021   Procedure: POLYPECTOMY;  Surgeon: Daneil Dolin, MD;  [facility];  Service: Endoscopy;;   SKIN SPLIT GRAFT Bilateral 09/03/2021   Procedure: SKIN GRAFT BILATERAL LEGS;  Surgeon: Newt Minion, MD;  Location: [place];  Service: Orthopedics;  Laterality: Bilateral;   SKIN SPLIT GRAFT Left 12/10/2021   Procedure: IRRIGATION AND DEBRIDEMENT LEFT CALF, APPLICATION SPLIT THICKNESS SKIN GRAFT;  Surgeon: Newt Minion, MD;  Location: [facility];  Service: Orthopedics;  Laterality: Left;  TOE SURGERY     Social History:  reports that she has never smoked. She has never used smokeless tobacco. She reports that she does not drink alcohol and does not use drugs.  Allergies  Allergen Reactions   Ace Inhibitors Cough    Family History  Problem Relation Age of Onset   Heart disease Mother    Diabetes Mother    Kidney disease Mother    Diabetes Father    Heart disease  Father    Diabetes Brother    Colon cancer Neg Hx     Prior to Admission medications   Medication Sig Start Date End Date Taking? Authorizing Provider  acetaminophen (TYLENOL) 500 MG tablet Take 1,000 mg by mouth every 6 (six) hours as needed for moderate pain.    [provider]  ascorbic acid (VITAMIN C) 1000 MG tablet Take 1 tablet (1,000 mg total) by mouth daily. Patient not taking: Reported on 03/16/2022 02/19/22   Nita Sells, MD  aspirin EC 81 MG tablet Take 1 tablet (81 mg total) by mouth daily with breakfast. 05/19/21 05/19/22  Irwin Brakeman L, MD  atorvastatin (LIPITOR) 10 MG tablet Take 1 tablet (10 mg total) by mouth daily. Patient taking differently: Take 10 mg by mouth at bedtime. 04/25/21   Roxan Hockey, MD  AURYXIA 1 GM 210 MG(Fe) tablet Take 420 mg by mouth 2 (two) times daily with a meal. 01/07/22   [provider]  Blood Glucose Monitoring Suppl (ACCU-CHEK GUIDE ME) w/Device KIT 1 Piece by Does not apply route as directed. 03/16/22   Cassandria Anger, MD  Continuous Blood Gluc Receiver (FREESTYLE LIBRE 2 READER) DEVI As directed 04/06/22   Cassandria Anger, MD  Continuous Blood Gluc Sensor (FREESTYLE LIBRE 2 SENSOR) MISC 1 Piece by Does not apply route every 14 (fourteen) days. 04/06/22   Cassandria Anger, MD  glucose blood (ACCU-CHEK GUIDE) test strip Use as instructed 03/16/22   Cassandria Anger, MD  HUMALOG KWIKPEN 100 UNIT/ML KwikPen Inject 5-11 Units into the skin 3 (three) times daily with meals. If eats 50% or more of meal. 03/16/22   Nida, Marella Chimes, MD  insulin glargine (LANTUS) 100 UNIT/ML injection Inject 0.2 mLs (20 Units total) into the skin at bedtime. 04/06/22   Cassandria Anger, MD  lactulose (CHRONULAC) 10 GM/15ML solution Take 10 g by mouth daily as needed for moderate constipation. 11/30/21   [provider]  levothyroxine (SYNTHROID) 50 MCG tablet Take 1 tablet (50 mcg total) by mouth daily before  breakfast. 04/25/21   Denton Brick, Courage, MD  pantoprazole (PROTONIX) 40 MG tablet Take 1 tablet (40 mg total) by mouth 2 (two) times daily. 04/25/21 04/25/22  Roxan Hockey, MD  pentoxifylline (TRENTAL) 400 MG CR tablet Take 1 tablet (400 mg total) by mouth 3 (three) times daily with meals. 01/26/22   Raiford Noble Latif, DO  polyethylene glycol (MIRALAX / GLYCOLAX) 17 g packet Take 17 g by mouth daily as needed for mild constipation. 02/19/22   Nita Sells, MD  silver sulfADIAZINE (SILVADENE) 1 % cream Apply 1 application. topically daily. Apply to affected area daily plus dry dressing 02/24/22   Newt Minion, MD  torsemide (DEMADEX) 100 MG tablet Take 150 mg by mouth daily.  05/22/20   [provider]  traMADol (ULTRAM) 50 MG tablet Take 1 tablet (50 mg total) by mouth every 6 (six) hours as needed. 02/19/22 02/19/23  Nita Sells, MD  Vitamin D, Ergocalciferol, (DRISDOL) 1.25 MG (  50000 UNIT) CAPS capsule Take 50,000 Units by mouth every 14 (fourteen) days. 04/20/21   [provider]    Physical Exam: Vitals:   04/29/22 2143 04/29/22 2145 04/29/22 2300  BP: 111/60  99/60  Pulse: 82  80  Resp: 18  18  Temp: 99.8 F (37.7 C)    TempSrc: Oral    SpO2: 96%  98%  Weight:  97.5 kg   Height:  _0  (1.676 m)    *** Data Reviewed: {Tip this will not be part of the note when signed- Document your independent interpretation of telemetry tracing, EKG, lab, Radiology test or any other diagnostic tests. Add any new diagnostic test ordered today. (Optional):26781} {Results:26384}  Assessment and Plan: No notes have been filed under this hospital service. Service: Hospitalist     Advance Care Planning:   Code Status: Prior ***  Consults: ***  Family Communication: ***  Severity of Illness: {Observation/Inpatient:21159}  Author: Bernadette Hoit, DO 04/29/2022 11:34 PM  For on call review [email or web address].

## 2022-04-29 NOTE — ED Provider Notes (Signed)
[facility] EMERGENCY DEPARTMENT Provider Note   CSN: [id number] Arrival date & time: 04/28/22  2159     History  Chief Complaint  Patient presents with   Chills    Body aches     Amy Moses is a 51 y.o. female.  Patient presents the ER for chills.  It sounds that the patient had Alysis morning when she got home she had shaking and teeth chattering like when she had chills in the past.  She checked her own temperature at home and it was not elevated.  She presents here for further evaluation.  She denies any complaints.  She has no cough, abdominal pain, diarrhea, constipation, chest pain, extremity pain.  She states that she does have a couple ulcers that are being treated pretty well they have both been like that within the last 24 hours without any evidence of infection there.  She has new no new pains nose areas and they are wrapped with Unna boots right now.        Home Medications Prior to Admission medications   Medication Sig Start Date End Date Taking? Authorizing Provider  acetaminophen (TYLENOL) 500 MG tablet Take 1,000 mg by mouth every 6 (six) hours as needed for moderate pain.    [provider]  ascorbic acid (VITAMIN C) 1000 MG tablet Take 1 tablet (1,000 mg total) by mouth daily. Patient not taking: Reported on 03/16/2022 02/19/22   Nita Sells, MD  aspirin EC 81 MG tablet Take 1 tablet (81 mg total) by mouth daily with breakfast. 05/19/21 05/19/22  Irwin Brakeman L, MD  atorvastatin (LIPITOR) 10 MG tablet Take 1 tablet (10 mg total) by mouth daily. Patient taking differently: Take 10 mg by mouth at bedtime. 04/25/21   Roxan Hockey, MD  AURYXIA 1 GM 210 MG(Fe) tablet Take 420 mg by mouth 2 (two) times daily with a meal. 01/07/22   [provider]  Blood Glucose Monitoring Suppl (ACCU-CHEK GUIDE ME) w/Device KIT 1 Piece by Does not apply route as directed. 03/16/22   Cassandria Anger, MD  Continuous Blood Gluc Receiver (FREESTYLE  LIBRE 2 READER) DEVI As directed 04/06/22   Cassandria Anger, MD  Continuous Blood Gluc Sensor (FREESTYLE LIBRE 2 SENSOR) MISC 1 Piece by Does not apply route every 14 (fourteen) days. 04/06/22   Cassandria Anger, MD  glucose blood (ACCU-CHEK GUIDE) test strip Use as instructed 03/16/22   Cassandria Anger, MD  HUMALOG KWIKPEN 100 UNIT/ML KwikPen Inject 5-11 Units into the skin 3 (three) times daily with meals. If eats 50% or more of meal. 03/16/22   Nida, Marella Chimes, MD  insulin glargine (LANTUS) 100 UNIT/ML injection Inject 0.2 mLs (20 Units total) into the skin at bedtime. 04/06/22   Cassandria Anger, MD  lactulose (CHRONULAC) 10 GM/15ML solution Take 10 g by mouth daily as needed for moderate constipation. 11/30/21   [provider]  levothyroxine (SYNTHROID) 50 MCG tablet Take 1 tablet (50 mcg total) by mouth daily before breakfast. 04/25/21   Denton Brick, Courage, MD  pantoprazole (PROTONIX) 40 MG tablet Take 1 tablet (40 mg total) by mouth 2 (two) times daily. 04/25/21 04/25/22  Roxan Hockey, MD  pentoxifylline (TRENTAL) 400 MG CR tablet Take 1 tablet (400 mg total) by mouth 3 (three) times daily with meals. 01/26/22   Raiford Noble Latif, DO  polyethylene glycol (MIRALAX / GLYCOLAX) 17 g packet Take 17 g by mouth daily as needed for mild constipation. 02/19/22   Samtani,  Jai-Gurmukh, MD  silver sulfADIAZINE (SILVADENE) 1 % cream Apply 1 application. topically daily. Apply to affected area daily plus dry dressing 02/24/22   Newt Minion, MD  torsemide (DEMADEX) 100 MG tablet Take 150 mg by mouth daily.  05/22/20   [provider]  traMADol (ULTRAM) 50 MG tablet Take 1 tablet (50 mg total) by mouth every 6 (six) hours as needed. 02/19/22 02/19/23  Nita Sells, MD  Vitamin D, Ergocalciferol, (DRISDOL) 1.25 MG (50000 UNIT) CAPS capsule Take 50,000 Units by mouth every 14 (fourteen) days. 04/20/21   [provider]      Allergies    Ace inhibitors    Review  of Systems   Review of Systems  Physical Exam Updated Vital Signs BP 114/67   Pulse 91   Temp (!) 100.6 F (38.1 C) (Oral)   Resp 17   Ht 5' 6"  (1.676 m)   Wt 97.5 kg   LMP 08/22/2015 (Approximate)   SpO2 94%   BMI 34.69 kg/m  Physical Exam Vitals and nursing note reviewed.  Constitutional:      Appearance: She is well-developed.  HENT:     Head: Normocephalic and atraumatic.     Mouth/Throat:     Mouth: Mucous membranes are dry.  Eyes:     Comments: Chronic right eye blindness  Cardiovascular:     Rate and Rhythm: Normal rate and regular rhythm.  Pulmonary:     Effort: No respiratory distress.     Breath sounds: No stridor.  Abdominal:     General: There is no distension.  Musculoskeletal:     Cervical back: Normal range of motion.  Skin:    General: Skin is warm and dry.  Neurological:     General: No focal deficit present.     Mental Status: She is alert.     ED Results / Procedures / Treatments   Labs (all labs ordered are listed, but only abnormal results are displayed) Labs Reviewed  CBC WITH DIFFERENTIAL/PLATELET - Abnormal; Notable for the following components:      Result Value   WBC 13.1 (*)    Hemoglobin 11.6 (*)    Neutro Abs 11.9 (*)    Lymphs Abs 0.4 (*)    Abs Immature Granulocytes 0.09 (*)    All other components within normal limits  COMPREHENSIVE METABOLIC PANEL - Abnormal; Notable for the following components:   Glucose, Bld 243 (*)    BUN 35 (*)    Creatinine, Ser 5.12 (*)    AST 14 (*)    GFR, Estimated 10 (*)    All other components within normal limits  CULTURE, BLOOD (ROUTINE X 2)  CULTURE, BLOOD (ROUTINE X 2)  SARS CORONAVIRUS 2 BY RT PCR    EKG None  Radiology DG Chest Portable 1 View  Result Date: 04/29/2022 CLINICAL DATA:  Body aches and chills for several hours, initial encounter EXAM: PORTABLE CHEST 1 VIEW COMPARISON:  03/30/2021 FINDINGS: Cardiac shadow is enlarged but stable. Aortic calcifications are seen. Lungs  are well aerated bilaterally without focal infiltrate or effusion. Mild increased bronchitic markings are noted. Degenerative changes of the shoulder joints are seen. IMPRESSION: Mild increased bronchitic markings are noted without focal confluent infiltrate. No other focal abnormality is noted. Electronically Signed   By: Inez Catalina M.D.   On: 04/29/2022 00:29    Procedures Procedures  Oh  Medications Ordered in ED Medications - No data to display  ED Course/ Medical Decision Making/ A&P  Medical Decision Making Amount and/or Complexity of Data Reviewed Labs: ordered. Radiology: ordered.  Risk OTC drugs.   Overall patient appears well. We will check blood cultures, chest x-ray and a COVID/flu.  Can likely be discharged if these things are unremarkable.  Will need PCP follow-up  Labs look good. Patient looks good. Will d/c with pcp follow up.   Final Clinical Impression(s) / ED Diagnoses Final diagnoses:  None    Rx / DC Orders ED Discharge Orders     None         Chrishawn Kring, Corene Cornea, MD 04/29/22 [phone]

## 2022-04-30 ENCOUNTER — Encounter (HOSPITAL_COMMUNITY): Payer: Self-pay | Admitting: Internal Medicine

## 2022-04-30 DIAGNOSIS — K219 Gastro-esophageal reflux disease without esophagitis: Secondary | ICD-10-CM

## 2022-04-30 DIAGNOSIS — E782 Mixed hyperlipidemia: Secondary | ICD-10-CM | POA: Diagnosis not present

## 2022-04-30 DIAGNOSIS — S91302D Unspecified open wound, left foot, subsequent encounter: Secondary | ICD-10-CM | POA: Diagnosis not present

## 2022-04-30 DIAGNOSIS — E039 Hypothyroidism, unspecified: Secondary | ICD-10-CM | POA: Diagnosis not present

## 2022-04-30 DIAGNOSIS — R7881 Bacteremia: Secondary | ICD-10-CM | POA: Diagnosis not present

## 2022-04-30 LAB — GLUCOSE, CAPILLARY
Glucose-Capillary: 262 mg/dL — ABNORMAL HIGH (ref 70–99)
Glucose-Capillary: 122 mg/dL — ABNORMAL HIGH (ref 70–99)
Glucose-Capillary: 219 mg/dL — ABNORMAL HIGH (ref 70–99)

## 2022-04-30 LAB — CBC
HCT: 34.9 % — ABNORMAL LOW (ref 36.0–46.0)
Hemoglobin: 10.5 g/dL — ABNORMAL LOW (ref 12.0–15.0)
MCH: 28.8 pg (ref 26.0–34.0)
MCHC: 30.1 g/dL (ref 30.0–36.0)
MCV: 95.6 fL (ref 80.0–100.0)
Platelets: 145 10*3/uL — ABNORMAL LOW (ref 150–400)
RBC: 3.65 MIL/uL — ABNORMAL LOW (ref 3.87–5.11)
RDW: 15.4 % (ref 11.5–15.5)
WBC: 8.9 10*3/uL (ref 4.0–10.5)
nRBC: 0 % (ref 0.0–0.2)

## 2022-04-30 LAB — COMPREHENSIVE METABOLIC PANEL
ALT: 13 U/L (ref 0–44)
AST: 14 U/L — ABNORMAL LOW (ref 15–41)
Albumin: 3.3 g/dL — ABNORMAL LOW (ref 3.5–5.0)
Alkaline Phosphatase: 108 U/L (ref 38–126)
Anion gap: 11 (ref 5–15)
BUN: 46 mg/dL — ABNORMAL HIGH (ref 6–20)
CO2: 23 mmol/L (ref 22–32)
Calcium: 8.8 mg/dL — ABNORMAL LOW (ref 8.9–10.3)
Chloride: 100 mmol/L (ref 98–111)
Creatinine, Ser: 6.54 mg/dL — ABNORMAL HIGH (ref 0.44–1.00)
GFR, Estimated: 7 mL/min — ABNORMAL LOW (ref >60)
Glucose, Bld: 180 mg/dL — ABNORMAL HIGH (ref 70–99)
Potassium: 5.1 mmol/L (ref 3.5–5.1)
Sodium: 134 mmol/L — ABNORMAL LOW (ref 135–145)
Total Bilirubin: 1.1 mg/dL (ref 0.3–1.2)
Total Protein: 6.5 g/dL (ref 6.5–8.1)

## 2022-04-30 LAB — PHOSPHORUS: Phosphorus: 5.3 mg/dL — ABNORMAL HIGH (ref 2.5–4.6)

## 2022-04-30 LAB — HEPATITIS B SURFACE ANTIBODY,QUALITATIVE: Hep B S Ab: NONREACTIVE

## 2022-04-30 LAB — HEPATITIS B CORE ANTIBODY, TOTAL: Hep B Core Total Ab: NONREACTIVE

## 2022-04-30 LAB — HEPATITIS B SURFACE ANTIGEN: Hepatitis B Surface Ag: NONREACTIVE

## 2022-04-30 LAB — CBG MONITORING, ED: Glucose-Capillary: 169 mg/dL — ABNORMAL HIGH (ref 70–99)

## 2022-04-30 LAB — MAGNESIUM: Magnesium: 2.1 mg/dL (ref 1.7–2.4)

## 2022-04-30 LAB — LACTIC ACID, PLASMA: Lactic Acid, Venous: 1.2 mmol/L (ref 0.5–1.9)

## 2022-04-30 MED ORDER — FERRIC CITRATE 1 GM 210 MG(FE) PO TABS
420.0000 mg | ORAL_TABLET | Freq: Two times a day (BID) | ORAL | Status: DC
Start: 2022-04-30 — End: 2022-05-02
  Administered 2022-04-30 – 2022-05-02 (×4): 420 mg via ORAL
  Filled 2022-04-30 (×9): qty 2

## 2022-04-30 MED ORDER — CHLORHEXIDINE GLUCONATE CLOTH 2 % EX PADS
6.0000 | MEDICATED_PAD | Freq: Every day | CUTANEOUS | Status: DC
  Administered 2022-05-01 – 2022-05-03 (×3): 6 via TOPICAL

## 2022-04-30 MED ORDER — ATORVASTATIN CALCIUM 10 MG PO TABS
10.0000 mg | ORAL_TABLET | Freq: Every day | ORAL | Status: DC
  Administered 2022-04-30 – 2022-05-02 (×4): 10 mg via ORAL
  Filled 2022-04-30 (×4): qty 1

## 2022-04-30 MED ORDER — SODIUM CHLORIDE 0.9 % IV SOLN
INTRAVENOUS | Status: AC
  Filled 2022-04-30: qty 10

## 2022-04-30 MED ORDER — PANTOPRAZOLE SODIUM 40 MG PO TBEC
40.0000 mg | DELAYED_RELEASE_TABLET | Freq: Two times a day (BID) | ORAL | Status: DC
  Administered 2022-04-30 – 2022-05-03 (×7): 40 mg via ORAL
  Filled 2022-04-30 (×7): qty 1

## 2022-04-30 MED ORDER — SODIUM CHLORIDE 0.9 % IV SOLN
1.0000 g | INTRAVENOUS | Status: DC
  Administered 2022-04-30 – 2022-05-01 (×2): 1 g via INTRAVENOUS
  Filled 2022-04-30 (×3): qty 10

## 2022-04-30 MED ORDER — INSULIN ASPART 100 UNIT/ML IJ SOLN
0.0000 [IU] | Freq: Three times a day (TID) | INTRAMUSCULAR | Status: DC
  Administered 2022-04-30: 3 [IU] via SUBCUTANEOUS
  Administered 2022-04-30 – 2022-05-02 (×5): 1 [IU] via SUBCUTANEOUS
  Administered 2022-05-02: 2 [IU] via SUBCUTANEOUS
  Administered 2022-05-02 – 2022-05-03 (×3): 1 [IU] via SUBCUTANEOUS
  Filled 2022-04-30: qty 1

## 2022-04-30 MED ORDER — INSULIN ASPART 100 UNIT/ML IJ SOLN
0.0000 [IU] | Freq: Every day | INTRAMUSCULAR | Status: DC
  Administered 2022-04-30 – 2022-05-01 (×2): 2 [IU] via SUBCUTANEOUS
  Administered 2022-05-02: 3 [IU] via SUBCUTANEOUS

## 2022-04-30 MED ORDER — INSULIN GLARGINE-YFGN 100 UNIT/ML ~~LOC~~ SOLN
10.0000 [IU] | Freq: Every day | SUBCUTANEOUS | Status: DC
  Administered 2022-04-30 – 2022-05-02 (×3): 10 [IU] via SUBCUTANEOUS
  Filled 2022-04-30 (×4): qty 0.1

## 2022-04-30 MED ORDER — ENOXAPARIN SODIUM 30 MG/0.3ML IJ SOSY
30.0000 mg | PREFILLED_SYRINGE | INTRAMUSCULAR | Status: DC
  Administered 2022-04-30 – 2022-05-03 (×4): 30 mg via SUBCUTANEOUS
  Filled 2022-04-30 (×4): qty 0.3

## 2022-04-30 MED ORDER — LEVOTHYROXINE SODIUM 50 MCG PO TABS
50.0000 ug | ORAL_TABLET | Freq: Every day | ORAL | Status: DC
  Administered 2022-04-30 – 2022-05-03 (×4): 50 ug via ORAL
  Filled 2022-04-30 (×4): qty 1

## 2022-04-30 MED ORDER — ACETAMINOPHEN 325 MG PO TABS: 650.0000 mg | ORAL_TABLET | Freq: Four times a day (QID) | ORAL | Status: DC | PRN

## 2022-04-30 MED ORDER — SILVER SULFADIAZINE 1 % EX CREA
TOPICAL_CREAM | Freq: Every day | CUTANEOUS | Status: DC
Start: 2022-04-30 — End: 2022-05-03
  Filled 2022-04-30: qty 85

## 2022-04-30 NOTE — Progress Notes (Signed)
PROGRESS NOTE    Amy Moses  IRW:[id number] DOB: 09-Mar-1971 DOA: 04/29/2022 PCP: Emelda Fear, DO    Brief Narrative:  51 year old female with history of diabetes, end-stage renal disease on hemodialysis, had initially come to the emergency room when she was feeling feverish and had chills.  Blood cultures were drawn at that time.  No clear source of infection was found and she was discharged.  Her cultures returned positive for gram-negative rods.  She was called back to the hospital for admission and started on antibiotics.   Assessment & Plan:   Principal Problem:   Bacteremia Active Problems:   Type 2 diabetes mellitus with ESRD (end-stage renal disease) (HCC)   Acquired hypothyroidism   Mixed hyperlipidemia   Essential hypertension   Non-healing open wound of heel   Obesity (BMI 30-39.9)   GERD (gastroesophageal reflux disease)   Gram-negative bacteremia -Source of bacteria is not entirely clear -She does not have any GI symptoms -Wounds from her lower extremities appear to be clean without signs of active infection -Continue cefepime -Follow-up culture data  End-stage renal disease -HD TTS -Nephrology following  Type 2 diabetes -Continued on basal insulin -Sliding scale insulin  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 -She is followed by orthopedics -She has compression dressings on both lower extremities for lymphedema -Recently saw her orthopedist in the last few days and his wounds were noted to be without infections  Obesity -BMI 33.8  Hypothyroidism -Continue Synthroid  Hyperlipidemia -Continue statin   DVT prophylaxis: enoxaparin (LOVENOX) injection 30 mg Start: 04/30/22 1000 SCDs Start: 04/30/22 7619  Code Status: Full code Family Communication: Discussed with patient Disposition Plan: Status is: Inpatient Remains inpatient appropriate because: Continued IV antibiotics pending culture data     Consultants:   Nephrology  Procedures:    Antimicrobials:  Cefepime   Subjective: She feels well, does not have any complaints at this point.  Objective: Vitals:   04/30/22 1537 04/30/22 1600 04/30/22 1615 04/30/22 1624  BP:  (!) 100/50 102/62   Pulse:  68 68   Resp:  16 18   Temp:      TempSrc:      SpO2:   100%   Weight: 97.5 kg   95 kg  Height:        Intake/Output Summary (Last 24 hours) at 04/30/2022 1908 Last data filed at 04/30/2022 1837 Gross per 24 hour  Intake 1460 ml  Output 3000 ml  Net -1540 ml   Filed Weights   04/29/22 2145 04/30/22 1537 04/30/22 1624  Weight: 97.5 kg 97.5 kg 95 kg    Examination:  General exam: Appears calm and comfortable  Respiratory system: Clear to auscultation. Respiratory effort normal. Cardiovascular system: S1 & S2 heard, RRR. No JVD, murmurs, rubs, gallops or clicks. No pedal edema. Gastrointestinal system: Abdomen is nondistended, soft and nontender. No organomegaly or masses felt. Normal bowel sounds heard. Central nervous system: Alert and oriented. No focal neurological deficits. Extremities: Symmetric 5 x 5 power. Skin: Right foot/leg wound as pictured below Psychiatry: Judgement and insight appear normal. Mood & affect appropriate.        Data Reviewed: I have personally reviewed following labs and imaging studies  CBC: Recent Labs  Lab 04/28/22 2331 04/29/22 2240 04/30/22 0631  WBC 13.1* 9.0 8.9  NEUTROABS 11.9* 7.5  --   HGB 11.6* 11.2* 10.5*  HCT 38.2 36.5 34.9*  MCV 95.0 94.6 95.6  PLT 154 157 509*   Basic Metabolic Panel: Recent  Labs  Lab 04/28/22 2331 04/29/22 2240 04/30/22 0631  NA 137 134* 134*  K 5.0 4.8 5.1  CL 100 98 100  CO2 26 26 23   GLUCOSE 243* 146* 180*  BUN 35* 43* 46*  CREATININE 5.12* 6.20* 6.54*  CALCIUM 9.0 9.2 8.8*  MG  --   --  2.1  PHOS  --   --  5.3*   GFR: Estimated Creatinine Clearance: 12.2 mL/min (A) (by C-G formula based on SCr of 6.54 mg/dL (H)). Liver Function  Tests: Recent Labs  Lab 04/28/22 2331 04/29/22 2240 04/30/22 0631  AST 14* 15 14*  ALT 13 15 13   ALKPHOS 121 114 108  BILITOT 0.8 1.2 1.1  PROT 7.0 7.0 6.5  ALBUMIN 3.7 3.5 3.3*   No results for input(s): "LIPASE", "AMYLASE" in the last 168 hours. No results for input(s): "AMMONIA" in the last 168 hours. Coagulation Profile: Recent Labs  Lab 04/29/22 2240  INR 1.2   Cardiac Enzymes: No results for input(s): "CKTOTAL", "CKMB", "CKMBINDEX", "TROPONINI" in the last 168 hours. BNP (last 3 results) No results for input(s): "PROBNP" in the last 8760 hours. HbA1C: No results for input(s): "HGBA1C" in the last 72 hours. CBG: Recent Labs  Lab 04/30/22 0837 04/30/22 1102 04/30/22 1619  GLUCAP 169* 262* 122*   Lipid Profile: No results for input(s): "CHOL", "HDL", "LDLCALC", "TRIG", "CHOLHDL", "LDLDIRECT" in the last 72 hours. Thyroid Function Tests: No results for input(s): "TSH", "T4TOTAL", "FREET4", "T3FREE", "THYROIDAB" in the last 72 hours. Anemia Panel: No results for input(s): "VITAMINB12", "FOLATE", "FERRITIN", "TIBC", "IRON", "RETICCTPCT" in the last 72 hours. Sepsis Labs: Recent Labs  Lab 04/29/22 2240 04/30/22 0123  LATICACIDVEN 1.2 1.2    Recent Results (from the past 240 hour(s))  Blood culture (routine x 2)     Status: Abnormal (Preliminary result)   Collection Time: 04/28/22 11:31 PM   Specimen: BLOOD  Result Value Ref Range Status   Specimen Description   Final    BLOOD RIGHT ANTECUBITAL Performed at [facility], [place] [address] [place], [place] [address]    Special Requests   Final    BOTTLES DRAWN AEROBIC AND ANAEROBIC Blood Culture adequate volume Performed at [name], [address] [place], [place] [address]    Culture  Setup Time   Final    GRAM NEGATIVE RODS ANAEROBIC BOTTLE ONLY Gram Stain Report Called to,Read Back By and Verified With: MARVA FIMMS AT 15:29 ON 04/29/22 BY SA CRITICAL RESULT CALLED TO, READ BACK BY AND VERIFIED  WITH: RN ANGIE COE ON 04/29/22 @ 2145 BY DRT Performed at [facility], [place] [address] [place], [place] [address]    Culture CITROBACTER KOSERI (A)  Final   Report Status PENDING  Incomplete  Blood Culture ID Panel (Reflexed)     Status: Abnormal   Collection Time: 04/28/22 11:31 PM  Result Value Ref Range Status   Enterococcus faecalis NOT DETECTED NOT DETECTED Final   Enterococcus Faecium NOT DETECTED NOT DETECTED Final   Listeria monocytogenes NOT DETECTED NOT DETECTED Final   Staphylococcus species NOT DETECTED NOT DETECTED Final   Staphylococcus aureus (BCID) NOT DETECTED NOT DETECTED Final   Staphylococcus epidermidis NOT DETECTED NOT DETECTED Final   Staphylococcus lugdunensis NOT DETECTED NOT DETECTED Final   Streptococcus species NOT DETECTED NOT DETECTED Final   Streptococcus agalactiae NOT DETECTED NOT DETECTED Final   Streptococcus pneumoniae NOT DETECTED NOT DETECTED Final   Streptococcus pyogenes NOT DETECTED NOT DETECTED Final  A.calcoaceticus-baumannii NOT DETECTED NOT DETECTED Final   Bacteroides fragilis NOT DETECTED NOT DETECTED Final   Enterobacterales DETECTED (A) NOT DETECTED Final    Comment: Enterobacterales represent a large order of gram negative bacteria, not a single organism. Refer to culture for further identification. CRITICAL RESULT CALLED TO, READ BACK BY AND VERIFIED WITH: RN ANGIE COE ON 04/29/22 @ 2145 BY DRT    Enterobacter cloacae complex NOT DETECTED NOT DETECTED Final   Escherichia coli NOT DETECTED NOT DETECTED Final   Klebsiella aerogenes NOT DETECTED NOT DETECTED Final   Klebsiella oxytoca NOT DETECTED NOT DETECTED Final   Klebsiella pneumoniae NOT DETECTED NOT DETECTED Final   Proteus species NOT DETECTED NOT DETECTED Final   Salmonella species NOT DETECTED NOT DETECTED Final   Serratia marcescens NOT DETECTED NOT DETECTED Final   Haemophilus influenzae NOT DETECTED NOT DETECTED Final   Neisseria meningitidis NOT DETECTED NOT DETECTED  Final   Pseudomonas aeruginosa NOT DETECTED NOT DETECTED Final   Stenotrophomonas maltophilia NOT DETECTED NOT DETECTED Final   Candida albicans NOT DETECTED NOT DETECTED Final   Candida auris NOT DETECTED NOT DETECTED Final   Candida glabrata NOT DETECTED NOT DETECTED Final   Candida krusei NOT DETECTED NOT DETECTED Final   Candida parapsilosis NOT DETECTED NOT DETECTED Final   Candida tropicalis NOT DETECTED NOT DETECTED Final   Cryptococcus neoformans/gattii NOT DETECTED NOT DETECTED Final   CTX-M ESBL NOT DETECTED NOT DETECTED Final   Carbapenem resistance IMP NOT DETECTED NOT DETECTED Final   Carbapenem resistance KPC NOT DETECTED NOT DETECTED Final   Carbapenem resistance NDM NOT DETECTED NOT DETECTED Final   Carbapenem resist OXA 48 LIKE NOT DETECTED NOT DETECTED Final   Carbapenem resistance VIM NOT DETECTED NOT DETECTED Final    Comment: Performed at [facility], [address], [place], [place] [address]  SARS Coronavirus 2 by RT PCR (hospital order, performed in [facility] hospital lab) *cepheid single result test* Anterior Nasal Swab     Status: None   Collection Time: 04/29/22  1:14 AM   Specimen: Anterior Nasal Swab  Result Value Ref Range Status   SARS Coronavirus 2 by RT PCR NEGATIVE NEGATIVE Final    Comment: (NOTE) SARS-CoV-2 target nucleic acids are NOT DETECTED.  The SARS-CoV-2 RNA is generally detectable in upper and lower respiratory specimens during the acute phase of infection. The lowest concentration of SARS-CoV-2 viral copies this assay can detect is 250 copies / mL. A negative result does not preclude SARS-CoV-2 infection and should not be used as the sole basis for treatment or other patient management decisions.  A negative result may occur with improper specimen collection / handling, submission of specimen other than nasopharyngeal swab, presence of viral mutation(s) within the areas targeted by this assay, and inadequate number of viral  copies (<250 copies / mL). A negative result must be combined with clinical observations, patient history, and epidemiological information.  Fact Sheet for Patients:   https://www.patel.info/  Fact Sheet for Healthcare Providers: https://hall.com/  This test is not yet approved or  cleared by the [place] FDA and has been authorized for detection and/or diagnosis of SARS-CoV-2 by FDA under an Emergency Use Authorization (EUA).  This EUA will remain in effect (meaning this test can be used) for the duration of the COVID-19 declaration under Section 564(b)(1) of the Act, 21 U.S.C. section 360bbb-3(b)(1), unless the authorization is terminated or revoked sooner.  Performed at [facility], [address], [place], [place] [address]  Resp Panel by RT-PCR (Flu A&B, Covid) Anterior Nasal Swab     Status: None   Collection Time: 04/29/22 10:02 PM   Specimen: Anterior Nasal Swab  Result Value Ref Range Status   SARS Coronavirus 2 by RT PCR NEGATIVE NEGATIVE Final    Comment: (NOTE) SARS-CoV-2 target nucleic acids are NOT DETECTED.  The SARS-CoV-2 RNA is generally detectable in upper respiratory specimens during the acute phase of infection. The lowest concentration of SARS-CoV-2 viral copies this assay can detect is 138 copies/mL. A negative result does not preclude SARS-Cov-2 infection and should not be used as the sole basis for treatment or other patient management decisions. A negative result may occur with  improper specimen collection/handling, submission of specimen other than nasopharyngeal swab, presence of viral mutation(s) within the areas targeted by this assay, and inadequate number of viral copies(<138 copies/mL). A negative result must be combined with clinical observations, patient history, and epidemiological information. The expected result is Negative.  Fact Sheet for Patients:   EntrepreneurPulse.com.au  Fact Sheet for Healthcare Providers:  IncredibleEmployment.be  This test is no t yet approved or cleared by the [place] FDA and  has been authorized for detection and/or diagnosis of SARS-CoV-2 by FDA under an Emergency Use Authorization (EUA). This EUA will remain  in effect (meaning this test can be used) for the duration of the COVID-19 declaration under Section 564(b)(1) of the Act, 21 U.S.C.section 360bbb-3(b)(1), unless the authorization is terminated  or revoked sooner.       Influenza A by PCR NEGATIVE NEGATIVE Final   Influenza B by PCR NEGATIVE NEGATIVE Final    Comment: (NOTE) The Xpert Xpress SARS-CoV-2/FLU/RSV plus assay is intended as an aid in the diagnosis of influenza from Nasopharyngeal swab specimens and should not be used as a sole basis for treatment. Nasal washings and aspirates are unacceptable for Xpert Xpress SARS-CoV-2/FLU/RSV testing.  Fact Sheet for Patients: EntrepreneurPulse.com.au  Fact Sheet for Healthcare Providers: IncredibleEmployment.be  This test is not yet approved or cleared by the [place] FDA and has been authorized for detection and/or diagnosis of SARS-CoV-2 by FDA under an Emergency Use Authorization (EUA). This EUA will remain in effect (meaning this test can be used) for the duration of the COVID-19 declaration under Section 564(b)(1) of the Act, 21 U.S.C. section 360bbb-3(b)(1), unless the authorization is terminated or revoked.  Performed at [facility], [address], [place], [place] [address]   Blood Culture (routine x 2)     Status: None (Preliminary result)   Collection Time: 04/29/22 10:24 PM   Specimen: Right Antecubital; Blood  Result Value Ref Range Status   Specimen Description   Final    RIGHT ANTECUBITAL BOTTLES DRAWN AEROBIC AND ANAEROBIC   Special Requests   Final    Blood Culture results may not be  optimal due to an excessive volume of blood received in culture bottles Performed at [name], [address], [place], [place] [address]    Culture PENDING  Incomplete   Report Status PENDING  Incomplete  Blood culture (routine x 2)     Status: None (Preliminary result)   Collection Time: 04/29/22 10:34 PM   Specimen: Right Antecubital; Blood  Result Value Ref Range Status   Specimen Description RIGHT ANTECUBITAL  Final   Special Requests   Final    BOTTLES DRAWN AEROBIC AND ANAEROBIC Blood Culture results may not be optimal due to an excessive volume of blood received in culture bottles   Culture  Final    NO GROWTH < 24 HOURS Performed at [facility], [address] [place], [place] [address]    Report Status PENDING  Incomplete  Blood Culture (routine x 2)     Status: None (Preliminary result)   Collection Time: 04/29/22 10:41 PM   Specimen: Right Antecubital; Blood  Result Value Ref Range Status   Specimen Description   Final    RIGHT ANTECUBITAL BOTTLES DRAWN AEROBIC AND ANAEROBIC   Special Requests Blood Culture adequate volume  Final   Culture  Setup Time   Final    GRAM NEGATIVE RODS AEROBIC BOTTLE ONLY Gram Stain Report Called to,Read Back By and Verified With: PREVIOUSLY CALLED TO MARVA FIMMS [id number] 04/29/22. WORK DONE AT [facility].    Culture   Final    NO GROWTH <12 HOURS Performed at [facility], [address] [place], [place] [address]    Report Status PENDING  Incomplete         Radiology Studies: DG Chest Port 1 View  Result Date: 04/29/2022 CLINICAL DATA:  Questionable sepsis - evaluate for abnormality EXAM: PORTABLE CHEST 1 VIEW COMPARISON:  04/29/2022 FINDINGS: Cardiomegaly. No overt edema, confluent opacities or effusions. No acute bony abnormality. IMPRESSION: Cardiomegaly.  No active disease. Electronically Signed   By: Rolm Baptise M.D.   On: 04/29/2022 23:04   DG Chest Portable 1 View  Result Date: 04/29/2022 CLINICAL DATA:  Body aches and chills  for several hours, initial encounter EXAM: PORTABLE CHEST 1 VIEW COMPARISON:  03/30/2021 FINDINGS: Cardiac shadow is enlarged but stable. Aortic calcifications are seen. Lungs are well aerated bilaterally without focal infiltrate or effusion. Mild increased bronchitic markings are noted. Degenerative changes of the shoulder joints are seen. IMPRESSION: Mild increased bronchitic markings are noted without focal confluent infiltrate. No other focal abnormality is noted. Electronically Signed   By: Inez Catalina M.D.   On: 04/29/2022 00:29        Scheduled Meds:  atorvastatin  10 mg Oral Daily   Chlorhexidine Gluconate Cloth  6 each Topical Q0600   enoxaparin (LOVENOX) injection  30 mg Subcutaneous Q24H   ferric citrate  420 mg Oral BID WC   insulin aspart  0-5 Units Subcutaneous QHS   insulin aspart  0-6 Units Subcutaneous TID WC   insulin glargine-yfgn  10 Units Subcutaneous QHS   levothyroxine  50 mcg Oral QAC breakfast   pantoprazole  40 mg Oral BID   silver sulfADIAZINE   Topical Daily   Continuous Infusions:  [START ON 05/01/2022] ceFEPime (MAXIPIME) IV       LOS: 1 day    Time spent: 32mins    Kathie Dike, MD [facility]   If 7PM-7AM, please contact night-coverage [email or web address]  04/30/2022, 7:08 PM

## 2022-04-30 NOTE — Progress Notes (Signed)
Pharmacy Antibiotic Note  Amy Moses is a 51 y.o. female admitted on 04/29/2022 with bacteremia (Enterbacterales).  Pharmacy has been consulted for cefepime dosing.  Plan: Cefepime 2g IV given in ED; continue cefepime 1g IV Q24H.  Height: 5\' 6"  (167.6 cm) Weight: 97.5 kg (214 lb 15.2 oz) IBW/kg (Calculated) : 59.3  Temp (24hrs), Avg:99.5 F (37.5 C), Min:99.2 F (37.3 C), Max:99.8 F (37.7 C)  Recent Labs  Lab 04/28/22 2331 04/29/22 2240 04/30/22 0123  WBC 13.1* 9.0  --   CREATININE 5.12* 6.20*  --   LATICACIDVEN  --  1.2 1.2    Estimated Creatinine Clearance: 13.1 mL/min (A) (by C-G formula based on SCr of 6.2 mg/dL (H)).    Allergies  Allergen Reactions   Ace Inhibitors Cough    Thank you for allowing pharmacy to be a part of this patient's care.  Wynona Neat, PharmD, BCPS  04/30/2022 5:57 AM

## 2022-04-30 NOTE — Progress Notes (Signed)
Amy Moses  Code Status: FULL Doyle Tegethoff is a 51 y.o. female patient admitted from ED awake, alert - oriented X4 - no acute distress noted. VSS -  no c/o shortness of breath, no c/o chest pain. Call light within reach, patient able to voice, and demonstrate understanding. Has a wound on right lateral foot, dressing changed. Wound to left foot wrapped in unna boots. Dr. Roderic Palau saw the patient and said not to unwrap the dressing until her next scheduled dressing change on Monday (patient had been having it changed weekly on Mondays PTA). Calciphylaxis scars on bilateral legs and healed wound on buttocks. Prophylactic foam dressing in place.  ?  Will cont to eval and treat per MD orders.  Melonie Florida, RN  04/30/2022

## 2022-04-30 NOTE — Consult Note (Signed)
Reason for Consult: Continuity of ESRD care Referring Physician: Kathie Dike MD [facility])  HPI:  51 year old woman with past medical history significant for hypertension, type 2 diabetes mellitus, dyslipidemia, hypothyroidism, calciphylaxis of the lower extremities and end-stage renal disease on hemodialysis on a TTS schedule at the [facility] unit in [place].  She was called back to the emergency room for additional work-up and management of fever of unknown source and Enterobacterales bacteremia seen on blood cultures from 6/8.  She denies any nausea, vomiting or diarrhea and reports decent appetite and energy.  She denies any abdominal pain and has no reports of hematochezia or melena.  She denies any chest pain or shortness of breath but has had a nonproductive cough for several days.  She reports pain over her upper back/left side of neck for the past 3 days.  She has been followed closely by the wound care service and both of her lower extremities are wrapped.  She denies urinary complaints and makes scant urine  Hemodialysis prescription: Unavailable with limited recall of detail by patient.  Left upper arm AV fistula.  Past Medical History:  Diagnosis Date   Anemia    Blindness of right eye with low vision in contralateral eye    s/p victrectomy   Diabetes mellitus, type II ([place])    Dyslipidemia    Glaucoma    History of blood transfusion    Hypertension    Hypothyroidism (acquired)    Kidney disease    Stage 5   Pneumonia     Past Surgical History:  Procedure Laterality Date   ABDOMINAL AORTOGRAM W/LOWER EXTREMITY Bilateral 12/18/2020   Procedure: ABDOMINAL AORTOGRAM W/LOWER EXTREMITY;  Surgeon: Elam Dutch, MD;  Location: [place] [facility];  Service: Cardiovascular;  Laterality: Bilateral;   ABDOMINAL AORTOGRAM W/LOWER EXTREMITY Bilateral 01/25/2022   Procedure: ABDOMINAL AORTOGRAM W/LOWER EXTREMITY;  Surgeon: Serafina Mitchell, MD;  Location: [place] [facility];  Service:  Cardiovascular;  Laterality: Bilateral;   AMPUTATION Right 02/16/2022   Procedure: RIGHT FOOT 5TH RAY AMPUTATION;  Surgeon: Newt Minion, MD;  Location: [place];  Service: Orthopedics;  Laterality: Right;   ANKLE FRACTURE SURGERY Right    AV FISTULA PLACEMENT Left 08/18/2020   Procedure: LEFT ARM BRACHIOCEPHALIC ARTERIOVENOUS (AV) FISTULA CREATION;  Surgeon: Elam Dutch, MD;  Location: [place];  Service: Vascular;  Laterality: Left;   BIOPSY  04/24/2021   Procedure: BIOPSY;  Surgeon: Eloise Harman, DO;  [facility];  Service: Endoscopy;;   CESAREAN SECTION     CHOLECYSTECTOMY     COLONOSCOPY  04/24/2021   Surgeon: Eloise Harman, DO;  nonbleeding internal hemorrhoids, 1 large (25 mm) pedunculated transverse colon polyp (prolapse type polyp) with adherent clot and stigmata of recent bleed.   COLONOSCOPY WITH PROPOFOL N/A 05/14/2021   Procedure: COLONOSCOPY WITH PROPOFOL;  Surgeon: Daneil Dolin, MD;  [facility];  Service: Endoscopy;  Laterality: N/A;   ESOPHAGOGASTRODUODENOSCOPY (EGD) WITH PROPOFOL N/A 04/24/2021   Surgeon: Eloise Harman, DO;  duodenal erosions and gastritis biopsied (pathology with peptic duodenitis, reactive gastropathy with erosions/chronic inflammation, negative for H. pylori)   EYE SURGERY     Vatrectomy   HEMOSTASIS CLIP PLACEMENT  05/14/2021   Procedure: HEMOSTASIS CLIP PLACEMENT;  Surgeon: Daneil Dolin, MD;  [facility];  Service: Endoscopy;;   IR PERC TUN PERIT CATH WO PORT S&I /IMAG  09/15/2020   IR REMOVAL TUN CV CATH W/O FL  02/19/2021  IR US GUIDE VASC ACCESS RIGHT  09/15/2020   POLYPECTOMY  04/24/2021   Procedure: POLYPECTOMY;  Surgeon: Eloise Harman, DO;  [facility];  Service: Endoscopy;;   POLYPECTOMY  05/14/2021   Procedure: POLYPECTOMY;  Surgeon: Daneil Dolin, MD;  [facility];  Service: Endoscopy;;   SKIN SPLIT GRAFT Bilateral 09/03/2021   Procedure: SKIN GRAFT  BILATERAL LEGS;  Surgeon: Newt Minion, MD;  Location: [place];  Service: Orthopedics;  Laterality: Bilateral;   SKIN SPLIT GRAFT Left 12/10/2021   Procedure: IRRIGATION AND DEBRIDEMENT LEFT CALF, APPLICATION SPLIT THICKNESS SKIN GRAFT;  Surgeon: Newt Minion, MD;  Location: [place];  Service: Orthopedics;  Laterality: Left;   TOE SURGERY      Family History  Problem Relation Age of Onset   Heart disease Mother    Diabetes Mother    Kidney disease Mother    Diabetes Father    Heart disease Father    Diabetes Brother    Colon cancer Neg Hx     Social History:  reports that she has never smoked. She has never used smokeless tobacco. She reports that she does not drink alcohol and does not use drugs.  Allergies:  Allergies  Allergen Reactions   Ace Inhibitors Cough    Medications: I have reviewed the patient's current medications. Scheduled:  atorvastatin  10 mg Oral Daily   Chlorhexidine Gluconate Cloth  6 each Topical Q0600   enoxaparin (LOVENOX) injection  30 mg Subcutaneous Q24H   ferric citrate  420 mg Oral BID WC   insulin aspart  0-5 Units Subcutaneous QHS   insulin aspart  0-6 Units Subcutaneous TID WC   insulin glargine-yfgn  10 Units Subcutaneous QHS   levothyroxine  50 mcg Oral QAC breakfast   pantoprazole  40 mg Oral BID      Latest Ref Rng & Units 04/30/2022    6:31 AM 04/29/2022   10:40 PM 04/28/2022   11:31 PM  BMP  Glucose 70 - 99 mg/dL 180  146  243   BUN 6 - 20 mg/dL 46  43  35   Creatinine 0.44 - 1.00 mg/dL 6.54  6.20  5.12   Sodium 135 - 145 mmol/L 134  134  137   Potassium 3.5 - 5.1 mmol/L 5.1  4.8  5.0   Chloride 98 - 111 mmol/L 100  98  100   CO2 22 - 32 mmol/L 23  26  26    Calcium 8.9 - 10.3 mg/dL 8.8  9.2  9.0       Latest Ref Rng & Units 04/30/2022    6:31 AM 04/29/2022   10:40 PM 04/28/2022   11:31 PM  CBC  WBC 4.0 - 10.5 K/uL 8.9  9.0  13.1   Hemoglobin 12.0 - 15.0 g/dL 10.5  11.2  11.6   Hematocrit 36.0 - 46.0 % 34.9  36.5  38.2   Platelets  150 - 400 K/uL 145  157  154      DG Chest Port 1 View  Result Date: 04/29/2022 CLINICAL DATA:  Questionable sepsis - evaluate for abnormality EXAM: PORTABLE CHEST 1 VIEW COMPARISON:  04/29/2022 FINDINGS: Cardiomegaly. No overt edema, confluent opacities or effusions. No acute bony abnormality. IMPRESSION: Cardiomegaly.  No active disease. Electronically Signed   By: Rolm Baptise M.D.   On: 04/29/2022 23:04   DG Chest Portable 1 View  Result Date: 04/29/2022 CLINICAL DATA:  Body aches and chills for several hours,  initial encounter EXAM: PORTABLE CHEST 1 VIEW COMPARISON:  03/30/2021 FINDINGS: Cardiac shadow is enlarged but stable. Aortic calcifications are seen. Lungs are well aerated bilaterally without focal infiltrate or effusion. Mild increased bronchitic markings are noted. Degenerative changes of the shoulder joints are seen. IMPRESSION: Mild increased bronchitic markings are noted without focal confluent infiltrate. No other focal abnormality is noted. Electronically Signed   By: Inez Catalina M.D.   On: 04/29/2022 00:29    Review of Systems  Constitutional:  Positive for activity change, fatigue and fever. Negative for appetite change and chills.  HENT:  Negative for hearing loss, nosebleeds, sore throat and trouble swallowing.   Eyes:  Negative for photophobia and visual disturbance.  Respiratory:  Positive for cough. Negative for apnea and shortness of breath.   Cardiovascular:  Negative for chest pain and leg swelling.  Gastrointestinal:  Negative for diarrhea, nausea and vomiting.  Genitourinary:  Negative for dysuria, flank pain, hematuria and urgency.  Musculoskeletal:  Positive for back pain and myalgias.  Skin:  Positive for color change and wound. Negative for rash.       Lower extremities  Neurological:  Positive for weakness. Negative for dizziness, tremors and headaches.   Blood pressure 117/81, pulse 76, temperature 98.7 F (37.1 C), temperature source Oral, resp. rate 16,  height 5\' 6"  (1.676 m), weight 97.5 kg, last menstrual period 08/22/2015, SpO2 99 %. Physical Exam Vitals and nursing note reviewed.  Constitutional:      General: She is not in acute distress.    Appearance: Normal appearance. She is not toxic-appearing.  HENT:     Head: Normocephalic and atraumatic.     Right Ear: External ear normal.     Left Ear: External ear normal.     Nose: Nose normal.     Mouth/Throat:     Mouth: Mucous membranes are dry.     Pharynx: Oropharynx is clear.  Eyes:     General: No scleral icterus.    Extraocular Movements: Extraocular movements intact.     Conjunctiva/sclera: Conjunctivae normal.  Cardiovascular:     Rate and Rhythm: Normal rate and regular rhythm.     Pulses: Normal pulses.     Heart sounds: Normal heart sounds.  Pulmonary:     Effort: Pulmonary effort is normal.     Breath sounds: Normal breath sounds.  Abdominal:     General: Abdomen is flat. Bowel sounds are normal.     Palpations: Abdomen is soft.     Tenderness: There is no abdominal tenderness. There is no guarding.  Musculoskeletal:     Cervical back: Normal range of motion and neck supple.     Comments: Lower extremities wrapped  Neurological:     Mental Status: She is alert and oriented to person, place, and time.     Assessment/Plan: 1.  Enterobacterales bacteremia: Unclear source but is usually from GI contaminant-unclear if her calciphylaxis ulcer on the left heel may be the point of entry.  On antimicrobial coverage with cefepime. 2.  End-stage renal disease: Usually on a TTS dialysis schedule and hemodialysis has been ordered electively for today. 3.  Anemia of chronic disease: Continue monitoring of hemoglobin and hematocrit, currently appears to be at goal and without indications for ESA.  On Auryxia for phosphorus binder/iron supplementation. 4.  Secondary hyperparathyroidism: Continue renal diet for phosphorus restriction and Auryxia for phosphorus binding. 5.   Hypertension: She reports intermittent blood pressure drops at dialysis and I will evaluate her blood pressure  trend to decide on need to begin predialysis midodrine. 6.  Insulin-dependent diabetes: Management per primary service  Cleofas Hudgins K. 04/30/2022, 10:57 AM

## 2022-05-01 ENCOUNTER — Encounter: Payer: Self-pay | Admitting: Orthopedic Surgery

## 2022-05-01 ENCOUNTER — Encounter (HOSPITAL_COMMUNITY): Payer: Self-pay | Admitting: Internal Medicine

## 2022-05-01 DIAGNOSIS — K219 Gastro-esophageal reflux disease without esophagitis: Secondary | ICD-10-CM | POA: Diagnosis not present

## 2022-05-01 DIAGNOSIS — E039 Hypothyroidism, unspecified: Secondary | ICD-10-CM | POA: Diagnosis not present

## 2022-05-01 DIAGNOSIS — I10 Essential (primary) hypertension: Secondary | ICD-10-CM | POA: Diagnosis not present

## 2022-05-01 DIAGNOSIS — R7881 Bacteremia: Secondary | ICD-10-CM | POA: Diagnosis not present

## 2022-05-01 LAB — GLUCOSE, CAPILLARY
Glucose-Capillary: 185 mg/dL — ABNORMAL HIGH (ref 70–99)
Glucose-Capillary: 187 mg/dL — ABNORMAL HIGH (ref 70–99)
Glucose-Capillary: 190 mg/dL — ABNORMAL HIGH (ref 70–99)
Glucose-Capillary: 220 mg/dL — ABNORMAL HIGH (ref 70–99)

## 2022-05-01 LAB — CULTURE, BLOOD (ROUTINE X 2): Special Requests: ADEQUATE

## 2022-05-01 LAB — HEPATITIS B SURFACE ANTIBODY, QUANTITATIVE: Hep B S AB Quant (Post): <3.1 m[IU]/mL — ABNORMAL LOW (ref >9.9)

## 2022-05-01 LAB — HEPATITIS C ANTIBODY: HCV Ab: NONREACTIVE

## 2022-05-01 NOTE — Progress Notes (Signed)
Office Visit Note   Patient: Amy Moses           Date of Birth: [birth year]           MRN: [id number] Visit Date: 04/05/2022              Requested by: Emelda Fear, [place] [address] PCP: Emelda Fear, DO  Chief Complaint  Patient presents with   Left Leg - Follow-up   Right Leg - Follow-up   Right Foot - Routine Post Op    02/16/2022 right foot 5th ray amputation       HPI: Patient is a 51 year old woman who presents in follow-up status post skin graft for calciphylaxis ulcers both lower extremities she is status post 1/5 ray amputation on the right.  Patient is working on getting lymphedema pumps.  Assessment & Plan: Visit Diagnoses:  1. Calciphylaxis of left lower extremity with nonhealing ulcer with necrosis of muscle (HCC)   2. Calciphylaxis of right lower extremity with nonhealing ulcer, limited to breakdown of skin ([place])     Plan: Continue with Silvadene dressing changes to the right foot Dynaflex wraps to the left lower extremity.  Follow-Up Instructions: Return in about 1 week (around 04/12/2022).   Ortho Exam  Patient is alert, oriented, no adenopathy, well-dressed, normal affect, normal respiratory effort. Examination the right posterior calf is completely healed the left calf is improving the lateral right foot is showing improved granulation.  Imaging: No results found. No images are attached to the encounter.  Labs: Lab Results  Component Value Date   HGBA1C 11.5 (H) 02/15/2022   HGBA1C 13.0 12/30/2021   HGBA1C 12.2 (H) 12/10/2021   ESRSEDRATE 34 (H) 02/14/2022   ESRSEDRATE 40 (H) 08/07/2021   CRP 7.0 (H) 02/14/2022   CRP 11.1 (H) 08/07/2021   REPTSTATUS PENDING 04/29/2022   GRAMSTAIN  07/08/2021    RARE WBC PRESENT,BOTH PMN AND MONONUCLEAR RARE GRAM POSITIVE RODS RARE GRAM POSITIVE COCCI IN PAIRS    CULT (A) 04/29/2022    CITROBACTER KOSERI SUSCEPTIBILITIES PERFORMED ON PREVIOUS CULTURE WITHIN THE LAST 5  DAYS. Performed at [facility], [place] [address], [place], [place] [address]    Doy Hutching CITROBACTER KOSERI 04/28/2022     Lab Results  Component Value Date   ALBUMIN 3.3 (L) 04/30/2022   ALBUMIN 3.5 04/29/2022   ALBUMIN 3.7 04/28/2022   PREALBUMIN 13.7 (L) 08/07/2021    Lab Results  Component Value Date   MG 2.1 04/30/2022   MG 2.1 05/14/2021   MG 2.8 (H) 05/11/2021   Lab Results  Component Value Date   VD25OH 36.9 12/30/2021    Lab Results  Component Value Date   PREALBUMIN 13.7 (L) 08/07/2021      Latest Ref Rng & Units 04/30/2022    6:31 AM 04/29/2022   10:40 PM 04/28/2022   11:31 PM  CBC EXTENDED  WBC 4.0 - 10.5 K/uL 8.9  9.0  13.1   RBC 3.87 - 5.11 MIL/uL 3.65  3.86  4.02   Hemoglobin 12.0 - 15.0 g/dL 10.5  11.2  11.6   HCT 36.0 - 46.0 % 34.9  36.5  38.2   Platelets 150 - 400 K/uL 145  157  154   NEUT# 1.7 - 7.7 K/uL  7.5  11.9   Lymph# 0.7 - 4.0 K/uL  0.9  0.4      There is no height or weight on file to calculate BMI.  Orders:  No orders of the defined types were placed in this encounter.  No orders of the defined types were placed in this encounter.    Procedures: No procedures performed  Clinical Data: No additional findings.  ROS:  All other systems negative, except as noted in the HPI. Review of Systems  Objective: Vital Signs: LMP 08/22/2015 (Approximate)   Specialty Comments:  No specialty comments available.  PMFS History: Patient Active Problem List   Diagnosis Date Noted   GERD (gastroesophageal reflux disease) 04/30/2022   Bacteremia 04/29/2022   Non-adherence to medical treatment 03/16/2022   Cutaneous abscess of right foot    Osteomyelitis of foot ([place]) 02/14/2022   Obesity (BMI 30-39.9) 01/26/2022   Ischemic ulcer of right foot ([place]) 01/24/2022   Normocytic anemia 01/24/2022   Sacral pressure ulcer 70/17/7939   Metabolic acidosis, increased anion gap 01/24/2022   Leg wound, left, sequela 12/10/2021   Open leg  wound 09/03/2021   Wound infection    Non-pressure chronic ulcer of right calf limited to breakdown of skin ([place])    Calciphylaxis of right lower extremity with nonhealing ulcer, limited to breakdown of skin ([place])    Chronic ulcer of left thigh ([place]) 08/07/2021   Calciphylaxis of left lower extremity with nonhealing ulcer with necrosis of muscle ([place]) 08/07/2021   Lower GI bleed 05/12/2021   Acute GI bleeding 04/24/2021   Acute blood loss anemia 04/23/2021   GI bleed 04/22/2021   Fever 03/31/2021   Acute on chronic heart failure with preserved ejection fraction (HFpEF) ([place]) 03/30/2021   End stage renal disease ([place]) 11/16/2020   Calciphylaxis 11/06/2020   Non-healing open wound of heel 11/03/2020   Diabetic foot infection ([employer]) 11/01/2020   Decubitus ulcer, heel 11/01/2020   Closed nondisplaced fracture of left patella 03/00/9233   Metabolic acidosis 00/76/2263   Acute on chronic renal failure ([place]) 06/10/2020   Acute pericardial effusion 06/10/2020   Chronic kidney disease, stage 4 (severe) ([place]) 03/05/2019   Vitamin D deficiency 01/28/2019   Type 2 diabetes mellitus with ESRD (end-stage renal disease) ([place]) 09/21/2015   Mixed hyperlipidemia 09/21/2015   Essential hypertension 09/21/2015   Acquired hypothyroidism 09/21/2015   Iris bomb 07/31/2012   Secondary angle-closure glaucoma 07/31/2012   Past Medical History:  Diagnosis Date   Anemia    Blindness of right eye with low vision in contralateral eye    s/p victrectomy   Diabetes mellitus, type II ([place])    Dyslipidemia    Glaucoma    History of blood transfusion    Hypertension    Hypothyroidism (acquired)    Kidney disease    Stage 5   Pneumonia     Family History  Problem Relation Age of Onset   Heart disease Mother    Diabetes Mother    Kidney disease Mother    Diabetes Father    Heart disease Father    Diabetes Brother    Colon cancer Neg Hx     Past Surgical History:  Procedure Laterality Date    ABDOMINAL AORTOGRAM W/LOWER EXTREMITY Bilateral 12/18/2020   Procedure: ABDOMINAL AORTOGRAM W/LOWER EXTREMITY;  Surgeon: Elam Dutch, MD;  Location: [place] [facility];  Service: Cardiovascular;  Laterality: Bilateral;   ABDOMINAL AORTOGRAM W/LOWER EXTREMITY Bilateral 01/25/2022   Procedure: ABDOMINAL AORTOGRAM W/LOWER EXTREMITY;  Surgeon: Serafina Mitchell, MD;  Location: [place] [facility];  Service: Cardiovascular;  Laterality: Bilateral;   AMPUTATION Right 02/16/2022   Procedure: RIGHT FOOT 5TH RAY AMPUTATION;  Surgeon: [name],  Illene Regulus, MD;  Location: [place];  Service: Orthopedics;  Laterality: Right;   ANKLE FRACTURE SURGERY Right    AV FISTULA PLACEMENT Left 08/18/2020   Procedure: LEFT ARM BRACHIOCEPHALIC ARTERIOVENOUS (AV) FISTULA CREATION;  Surgeon: Elam Dutch, MD;  Location: [place];  Service: Vascular;  Laterality: Left;   BIOPSY  04/24/2021   Procedure: BIOPSY;  Surgeon: Eloise Harman, DO;  [facility];  Service: Endoscopy;;   CESAREAN SECTION     CHOLECYSTECTOMY     COLONOSCOPY  04/24/2021   Surgeon: Eloise Harman, DO;  nonbleeding internal hemorrhoids, 1 large (25 mm) pedunculated transverse colon polyp (prolapse type polyp) with adherent clot and stigmata of recent bleed.   COLONOSCOPY WITH PROPOFOL N/A 05/14/2021   Procedure: COLONOSCOPY WITH PROPOFOL;  Surgeon: Daneil Dolin, MD;  [facility];  Service: Endoscopy;  Laterality: N/A;   ESOPHAGOGASTRODUODENOSCOPY (EGD) WITH PROPOFOL N/A 04/24/2021   Surgeon: Eloise Harman, DO;  duodenal erosions and gastritis biopsied (pathology with peptic duodenitis, reactive gastropathy with erosions/chronic inflammation, negative for H. pylori)   EYE SURGERY     Vatrectomy   HEMOSTASIS CLIP PLACEMENT  05/14/2021   Procedure: HEMOSTASIS CLIP PLACEMENT;  Surgeon: Daneil Dolin, MD;  [facility];  Service: Endoscopy;;   IR PERC TUN PERIT CATH WO PORT S&I /IMAG  09/15/2020   IR REMOVAL TUN  CV CATH W/O FL  02/19/2021   IR US GUIDE VASC ACCESS RIGHT  09/15/2020   POLYPECTOMY  04/24/2021   Procedure: POLYPECTOMY;  Surgeon: Eloise Harman, DO;  [facility];  Service: Endoscopy;;   POLYPECTOMY  05/14/2021   Procedure: POLYPECTOMY;  Surgeon: Daneil Dolin, MD;  [facility];  Service: Endoscopy;;   SKIN SPLIT GRAFT Bilateral 09/03/2021   Procedure: SKIN GRAFT BILATERAL LEGS;  Surgeon: Newt Minion, MD;  Location: [place];  Service: Orthopedics;  Laterality: Bilateral;   SKIN SPLIT GRAFT Left 12/10/2021   Procedure: IRRIGATION AND DEBRIDEMENT LEFT CALF, APPLICATION SPLIT THICKNESS SKIN GRAFT;  Surgeon: Newt Minion, MD;  Location: [place];  Service: Orthopedics;  Laterality: Left;   TOE SURGERY     Social History   Occupational History   Not on file  Tobacco Use   Smoking status: Never   Smokeless tobacco: Never  Vaping Use   Vaping Use: Never used  Substance and Sexual Activity   Alcohol use: No   Drug use: No   Sexual activity: Yes    Birth control/protection: Condom

## 2022-05-01 NOTE — Progress Notes (Signed)
PROGRESS NOTE    Amy Moses  WGN:[id number] DOB: 03-12-1971 DOA: 04/29/2022 PCP: Emelda Fear, DO    Brief Narrative:  51 year old female with history of diabetes, end-stage renal disease on hemodialysis, had initially come to the emergency room when she was feeling feverish and had chills.  Blood cultures were drawn at that time.  No clear source of infection was found and she was discharged.  Her cultures returned positive for gram-negative rods.  She was called back to the hospital for admission and started on antibiotics.   Assessment & Plan:   Principal Problem:   Bacteremia Active Problems:   Type 2 diabetes mellitus with ESRD (end-stage renal disease) (HCC)   Acquired hypothyroidism   Mixed hyperlipidemia   Essential hypertension   Non-healing open wound of heel   Obesity (BMI 30-39.9)   GERD (gastroesophageal reflux disease)   Citrobacter bacteremia -Source of bacteria is not entirely clear -She does not have any GI symptoms -Wounds from her lower extremities appear to be clean without signs of active infection -Continue cefepime -Discussed with infectious disease, Dr. 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 -She is followed by orthopedics -She has compression dressings on both lower extremities for lymphedema -Recently saw her orthopedist in the last few days and his wounds were noted to be without infections  Obesity -BMI 33.8  Hypothyroidism -Continue Synthroid  Hyperlipidemia -Continue statin   DVT prophylaxis: enoxaparin (LOVENOX) injection 30 mg Start: 04/30/22 1000 SCDs Start: 04/30/22 0605  Code Status: Full code Family Communication: Discussed with patient Disposition Plan:  Status is: Inpatient Remains inpatient appropriate because: Continued IV antibiotics pending culture data     Consultants:  Nephrology  Procedures:    Antimicrobials:  Cefepime   Subjective: Denies any new complaints.  Objective: Vitals:   04/30/22 1624 04/30/22 2123 05/01/22 0639 05/01/22 1436  BP:  98/61 104/66 118/76  Pulse:  76 72 76  Resp:  16 16 18   Temp:  98.5 F (36.9 C) 98 F (36.7 C) 98.8 F (37.1 C)  TempSrc:  Oral Oral Oral  SpO2:  98% 96% 100%  Weight: 95 kg     Height:        Intake/Output Summary (Last 24 hours) at 05/01/2022 1900 Last data filed at 05/01/2022 1803 Gross per 24 hour  Intake 1310.56 ml  Output --  Net 1310.56 ml   Filed Weights   04/29/22 2145 04/30/22 1537 04/30/22 1624  Weight: 97.5 kg 97.5 kg 95 kg    Examination:  General exam: Appears calm and comfortable  Respiratory system: Clear to auscultation. Respiratory effort normal. Cardiovascular system: S1 & S2 heard, RRR. No JVD, murmurs, rubs, gallops or clicks. No pedal edema. Gastrointestinal system: Abdomen is nondistended, soft and nontender. No organomegaly or masses felt. Normal bowel sounds heard. Central nervous system: Alert and oriented. No focal neurological deficits. Extremities: Symmetric 5 x 5 power. Skin: Right foot/leg wound as pictured below Psychiatry: Judgement and insight appear normal. Mood & affect appropriate.    Data Reviewed: I have personally reviewed following labs and imaging studies  CBC: Recent Labs  Lab 04/28/22 2331 04/29/22 2240 04/30/22 0631  WBC 13.1* 9.0 8.9  NEUTROABS 11.9* 7.5  --   HGB 11.6* 11.2* 10.5*  HCT 38.2 36.5 34.9*  MCV 95.0 94.6 95.6  PLT 154 157 621*   Basic Metabolic Panel: Recent Labs  Lab 04/28/22 2331 04/29/22 2240 04/30/22 0631  NA 137 134* 134*  K 5.0 4.8 5.1  CL 100 98 100  CO2 26 26 23   GLUCOSE 243* 146* 180*  BUN 35* 43* 46*  CREATININE 5.12* 6.20* 6.54*  CALCIUM 9.0 9.2 8.8*  MG  --   --   2.1  PHOS  --   --  5.3*   GFR: Estimated Creatinine Clearance: 12.2 mL/min (A) (by C-G formula based on SCr of 6.54 mg/dL (H)). Liver Function Tests: Recent Labs  Lab 04/28/22 2331 04/29/22 2240 04/30/22 0631  AST 14* 15 14*  ALT 13 15 13   ALKPHOS 121 114 108  BILITOT 0.8 1.2 1.1  PROT 7.0 7.0 6.5  ALBUMIN 3.7 3.5 3.3*   No results for input(s): "LIPASE", "AMYLASE" in the last 168 hours. No results for input(s): "AMMONIA" in the last 168 hours. Coagulation Profile: Recent Labs  Lab 04/29/22 2240  INR 1.2   Cardiac Enzymes: No results for input(s): "CKTOTAL", "CKMB", "CKMBINDEX", "TROPONINI" in the last 168 hours. BNP (last 3 results) No results for input(s): "PROBNP" in the last 8760 hours. HbA1C: No results for input(s): "HGBA1C" in the last 72 hours. CBG: Recent Labs  Lab 04/30/22 1619 04/30/22 2134 05/01/22 0755 05/01/22 1140 05/01/22 1638  GLUCAP 122* 219* 185* 187* 190*   Lipid Profile: No results for input(s): "CHOL", "HDL", "LDLCALC", "TRIG", "CHOLHDL", "LDLDIRECT" in the last 72 hours. Thyroid Function Tests: No results for input(s): "TSH", "T4TOTAL", "FREET4", "T3FREE", "THYROIDAB" in the last 72 hours. Anemia Panel: No results for input(s): "VITAMINB12", "FOLATE", "FERRITIN", "TIBC", "IRON", "RETICCTPCT" in the last 72 hours. Sepsis Labs: Recent Labs  Lab 04/29/22 2240 04/30/22 0123  LATICACIDVEN 1.2 1.2    Recent Results (from the past 240 hour(s))  Blood culture (routine x 2)     Status: Abnormal   Collection Time: 04/28/22 11:31 PM   Specimen: BLOOD  Result Value Ref Range Status   Specimen Description   Final    BLOOD RIGHT ANTECUBITAL Performed at [facility], [place] [address] [place], [place] [address]    Special Requests   Final    BOTTLES DRAWN AEROBIC AND ANAEROBIC Blood Culture adequate volume Performed at [facility], [address], [place], [place] [address]    Culture  Setup Time   Final    GRAM NEGATIVE RODS  ANAEROBIC BOTTLE ONLY Gram Stain Report Called to,Read Back By and Verified With: MARVA FIMMS AT 15:29 ON 04/29/22 BY SA CRITICAL RESULT CALLED TO, READ BACK BY AND VERIFIED WITH: RN ANGIE COE ON 04/29/22 @ 2145 BY DRT Performed at [facility], [place] [address] [place], [place] [address]    Culture CITROBACTER KOSERI (A)  Final   Report Status 05/01/2022 FINAL  Final   Organism ID, Bacteria CITROBACTER KOSERI  Final      Susceptibility   Citrobacter koseri - MIC*    CEFAZOLIN <=4 SENSITIVE Sensitive     CEFEPIME <=0.12 SENSITIVE Sensitive     CEFTAZIDIME <=1 SENSITIVE Sensitive     CEFTRIAXONE <=0.25 SENSITIVE Sensitive     CIPROFLOXACIN <=0.25 SENSITIVE Sensitive     GENTAMICIN <=1 SENSITIVE Sensitive     IMIPENEM <=0.25 SENSITIVE Sensitive     TRIMETH/SULFA <=20 SENSITIVE Sensitive     PIP/TAZO 16 SENSITIVE Sensitive     * CITROBACTER KOSERI  Blood Culture ID Panel (Reflexed)  Status: Abnormal   Collection Time: 04/28/22 11:31 PM  Result Value Ref Range Status   Enterococcus faecalis NOT DETECTED NOT DETECTED Final   Enterococcus Faecium NOT DETECTED NOT DETECTED Final   Listeria monocytogenes NOT DETECTED NOT DETECTED Final   Staphylococcus species NOT DETECTED NOT DETECTED Final   Staphylococcus aureus (BCID) NOT DETECTED NOT DETECTED Final   Staphylococcus epidermidis NOT DETECTED NOT DETECTED Final   Staphylococcus lugdunensis NOT DETECTED NOT DETECTED Final   Streptococcus species NOT DETECTED NOT DETECTED Final   Streptococcus agalactiae NOT DETECTED NOT DETECTED Final   Streptococcus pneumoniae NOT DETECTED NOT DETECTED Final   Streptococcus pyogenes NOT DETECTED NOT DETECTED Final   A.calcoaceticus-baumannii NOT DETECTED NOT DETECTED Final   Bacteroides fragilis NOT DETECTED NOT DETECTED Final   Enterobacterales DETECTED (A) NOT DETECTED Final    Comment: Enterobacterales represent a large order of gram negative bacteria, not a single organism. Refer to culture for  further identification. CRITICAL RESULT CALLED TO, READ BACK BY AND VERIFIED WITH: RN ANGIE COE ON 04/29/22 @ 2145 BY DRT    Enterobacter cloacae complex NOT DETECTED NOT DETECTED Final   Escherichia coli NOT DETECTED NOT DETECTED Final   Klebsiella aerogenes NOT DETECTED NOT DETECTED Final   Klebsiella oxytoca NOT DETECTED NOT DETECTED Final   Klebsiella pneumoniae NOT DETECTED NOT DETECTED Final   Proteus species NOT DETECTED NOT DETECTED Final   Salmonella species NOT DETECTED NOT DETECTED Final   Serratia marcescens NOT DETECTED NOT DETECTED Final   Haemophilus influenzae NOT DETECTED NOT DETECTED Final   Neisseria meningitidis NOT DETECTED NOT DETECTED Final   Pseudomonas aeruginosa NOT DETECTED NOT DETECTED Final   Stenotrophomonas maltophilia NOT DETECTED NOT DETECTED Final   Candida albicans NOT DETECTED NOT DETECTED Final   Candida auris NOT DETECTED NOT DETECTED Final   Candida glabrata NOT DETECTED NOT DETECTED Final   Candida krusei NOT DETECTED NOT DETECTED Final   Candida parapsilosis NOT DETECTED NOT DETECTED Final   Candida tropicalis NOT DETECTED NOT DETECTED Final   Cryptococcus neoformans/gattii NOT DETECTED NOT DETECTED Final   CTX-M ESBL NOT DETECTED NOT DETECTED Final   Carbapenem resistance IMP NOT DETECTED NOT DETECTED Final   Carbapenem resistance KPC NOT DETECTED NOT DETECTED Final   Carbapenem resistance NDM NOT DETECTED NOT DETECTED Final   Carbapenem resist OXA 48 LIKE NOT DETECTED NOT DETECTED Final   Carbapenem resistance VIM NOT DETECTED NOT DETECTED Final    Comment: Performed at [facility], [address] [place], [place] [address]  SARS Coronavirus 2 by RT PCR (hospital order, performed in [facility] hospital lab) *cepheid single result test* Anterior Nasal Swab     Status: None   Collection Time: 04/29/22  1:14 AM   Specimen: Anterior Nasal Swab  Result Value Ref Range Status   SARS Coronavirus 2 by RT PCR NEGATIVE NEGATIVE Final     Comment: (NOTE) SARS-CoV-2 target nucleic acids are NOT DETECTED.  The SARS-CoV-2 RNA is generally detectable in upper and lower respiratory specimens during the acute phase of infection. The lowest concentration of SARS-CoV-2 viral copies this assay can detect is 250 copies / mL. A negative result does not preclude SARS-CoV-2 infection and should not be used as the sole basis for treatment or other patient management decisions.  A negative result may occur with improper specimen collection / handling, submission of specimen other than nasopharyngeal swab, presence of viral mutation(s) within the areas targeted by this assay, and inadequate number of viral  copies (<250 copies / mL). A negative result must be combined with clinical observations, patient history, and epidemiological information.  Fact Sheet for Patients:   https://www.patel.info/  Fact Sheet for Healthcare Providers: https://hall.com/  This test is not yet approved or  cleared by the [place] FDA and has been authorized for detection and/or diagnosis of SARS-CoV-2 by FDA under an Emergency Use Authorization (EUA).  This EUA will remain in effect (meaning this test can be used) for the duration of the COVID-19 declaration under Section 564(b)(1) of the Act, 21 U.S.C. section 360bbb-3(b)(1), unless the authorization is terminated or revoked sooner.  Performed at [facility], [address], [place], [place] [address]   Resp Panel by RT-PCR (Flu A&B, Covid) Anterior Nasal Swab     Status: None   Collection Time: 04/29/22 10:02 PM   Specimen: Anterior Nasal Swab  Result Value Ref Range Status   SARS Coronavirus 2 by RT PCR NEGATIVE NEGATIVE Final    Comment: (NOTE) SARS-CoV-2 target nucleic acids are NOT DETECTED.  The SARS-CoV-2 RNA is generally detectable in upper respiratory specimens during the acute phase of infection. The lowest concentration of SARS-CoV-2 viral  copies this assay can detect is 138 copies/mL. A negative result does not preclude SARS-Cov-2 infection and should not be used as the sole basis for treatment or other patient management decisions. A negative result may occur with  improper specimen collection/handling, submission of specimen other than nasopharyngeal swab, presence of viral mutation(s) within the areas targeted by this assay, and inadequate number of viral copies(<138 copies/mL). A negative result must be combined with clinical observations, patient history, and epidemiological information. The expected result is Negative.  Fact Sheet for Patients:  EntrepreneurPulse.com.au  Fact Sheet for Healthcare Providers:  IncredibleEmployment.be  This test is no t yet approved or cleared by the [place] FDA and  has been authorized for detection and/or diagnosis of SARS-CoV-2 by FDA under an Emergency Use Authorization (EUA). This EUA will remain  in effect (meaning this test can be used) for the duration of the COVID-19 declaration under Section 564(b)(1) of the Act, 21 U.S.C.section 360bbb-3(b)(1), unless the authorization is terminated  or revoked sooner.       Influenza A by PCR NEGATIVE NEGATIVE Final   Influenza B by PCR NEGATIVE NEGATIVE Final    Comment: (NOTE) The Xpert Xpress SARS-CoV-2/FLU/RSV plus assay is intended as an aid in the diagnosis of influenza from Nasopharyngeal swab specimens and should not be used as a sole basis for treatment. Nasal washings and aspirates are unacceptable for Xpert Xpress SARS-CoV-2/FLU/RSV testing.  Fact Sheet for Patients: EntrepreneurPulse.com.au  Fact Sheet for Healthcare Providers: IncredibleEmployment.be  This test is not yet approved or cleared by the [place] FDA and has been authorized for detection and/or diagnosis of SARS-CoV-2 by FDA under an Emergency Use Authorization (EUA). This  EUA will remain in effect (meaning this test can be used) for the duration of the COVID-19 declaration under Section 564(b)(1) of the Act, 21 U.S.C. section 360bbb-3(b)(1), unless the authorization is terminated or revoked.  Performed at [facility], [address], [place], [place] [address]   Blood Culture (routine x 2)     Status: None (Preliminary result)   Collection Time: 04/29/22 10:24 PM   Specimen: Right Antecubital; Blood  Result Value Ref Range Status   Specimen Description   Final    RIGHT ANTECUBITAL BOTTLES DRAWN AEROBIC AND ANAEROBIC   Special Requests   Final    Blood Culture  results may not be optimal due to an excessive volume of blood received in culture bottles   Culture   Final    NO GROWTH 1 DAY Performed at [facility], [address] [place], [place] [address]    Report Status PENDING  Incomplete  Blood culture (routine x 2)     Status: None (Preliminary result)   Collection Time: 04/29/22 10:34 PM   Specimen: Right Antecubital; Blood  Result Value Ref Range Status   Specimen Description RIGHT ANTECUBITAL  Final   Special Requests   Final    BOTTLES DRAWN AEROBIC AND ANAEROBIC Blood Culture results may not be optimal due to an excessive volume of blood received in culture bottles   Culture   Final    NO GROWTH 2 DAYS Performed at [facility], [address] [place], [place] [address]    Report Status PENDING  Incomplete  Blood Culture (routine x 2)     Status: Abnormal (Preliminary result)   Collection Time: 04/29/22 10:41 PM   Specimen: Right Antecubital; Blood  Result Value Ref Range Status   Specimen Description   Final    RIGHT ANTECUBITAL BOTTLES DRAWN AEROBIC AND ANAEROBIC Performed at [facility], [address] [place], [place] [address]    Special Requests   Final    Blood Culture adequate volume Performed at [facility], [address], [place], [place] [address]    Culture  Setup Time   Final    GRAM NEGATIVE RODS AEROBIC BOTTLE  ONLY Gram Stain Report Called to,Read Back By and Verified With: PREVIOUSLY CALLED TO MARVA FIMMS 1529 04/29/22. WORK DONE AT [facility]. CRITICAL VALUE NOTED.  VALUE IS CONSISTENT WITH PREVIOUSLY REPORTED AND CALLED VALUE.    Culture (A)  Final    CITROBACTER KOSERI SUSCEPTIBILITIES PERFORMED ON PREVIOUS CULTURE WITHIN THE LAST 5 DAYS. Performed at [facility], [place] [address], [place], [place] [address]    Report Status PENDING  Incomplete         Radiology Studies: DG Chest Port 1 View  Result Date: 04/29/2022 CLINICAL DATA:  Questionable sepsis - evaluate for abnormality EXAM: PORTABLE CHEST 1 VIEW COMPARISON:  04/29/2022 FINDINGS: Cardiomegaly. No overt edema, confluent opacities or effusions. No acute bony abnormality. IMPRESSION: Cardiomegaly.  No active disease. Electronically Signed   By: Rolm Baptise M.D.   On: 04/29/2022 23:04        Scheduled Meds:  atorvastatin  10 mg Oral Daily   Chlorhexidine Gluconate Cloth  6 each Topical Q0600   enoxaparin (LOVENOX) injection  30 mg Subcutaneous Q24H   ferric citrate  420 mg Oral BID WC   insulin aspart  0-5 Units Subcutaneous QHS   insulin aspart  0-6 Units Subcutaneous TID WC   insulin glargine-yfgn  10 Units Subcutaneous QHS   levothyroxine  50 mcg Oral QAC breakfast   pantoprazole  40 mg Oral BID   silver sulfADIAZINE   Topical Daily   Continuous Infusions:  ceFEPime (MAXIPIME) IV Stopped (04/30/22 2333)     LOS: 2 days    Time spent: 77mins    Kathie Dike, MD [facility]   If 7PM-7AM, please contact night-coverage [email or web address]  05/01/2022, 7:00 PM

## 2022-05-02 ENCOUNTER — Inpatient Hospital Stay (HOSPITAL_COMMUNITY): Payer: Medicare Other

## 2022-05-02 DIAGNOSIS — R7881 Bacteremia: Secondary | ICD-10-CM

## 2022-05-02 DIAGNOSIS — K219 Gastro-esophageal reflux disease without esophagitis: Secondary | ICD-10-CM | POA: Diagnosis not present

## 2022-05-02 DIAGNOSIS — I10 Essential (primary) hypertension: Secondary | ICD-10-CM | POA: Diagnosis not present

## 2022-05-02 DIAGNOSIS — E039 Hypothyroidism, unspecified: Secondary | ICD-10-CM | POA: Diagnosis not present

## 2022-05-02 LAB — GLUCOSE, CAPILLARY
Glucose-Capillary: 153 mg/dL — ABNORMAL HIGH (ref 70–99)
Glucose-Capillary: 213 mg/dL — ABNORMAL HIGH (ref 70–99)
Glucose-Capillary: 179 mg/dL — ABNORMAL HIGH (ref 70–99)
Glucose-Capillary: 256 mg/dL — ABNORMAL HIGH (ref 70–99)

## 2022-05-02 LAB — CULTURE, BLOOD (ROUTINE X 2): Special Requests: ADEQUATE

## 2022-05-02 LAB — ECHOCARDIOGRAM COMPLETE
AR max vel: 1.67 cm2
AV Area VTI: 1.96 cm2
AV Area mean vel: 1.76 cm2
AV Mean grad: 5 mmHg
AV Peak grad: 9.6 mmHg
Ao pk vel: 1.55 m/s
Area-P 1/2: 3.13 cm2
Calc EF: 44.4 %
Height: 66 in
MV VTI: 1.86 cm2
S' Lateral: 3.7 cm
Single Plane A2C EF: 43.3 %
Single Plane A4C EF: 48.1 %
Weight: 3350.99 oz

## 2022-05-02 LAB — RENAL FUNCTION PANEL
Albumin: 3.1 g/dL — ABNORMAL LOW (ref 3.5–5.0)
Anion gap: 9 (ref 5–15)
BUN: 44 mg/dL — ABNORMAL HIGH (ref 6–20)
CO2: 24 mmol/L (ref 22–32)
Calcium: 8.7 mg/dL — ABNORMAL LOW (ref 8.9–10.3)
Chloride: 101 mmol/L (ref 98–111)
Creatinine, Ser: 6.27 mg/dL — ABNORMAL HIGH (ref 0.44–1.00)
GFR, Estimated: 8 mL/min — ABNORMAL LOW (ref >60)
Glucose, Bld: 174 mg/dL — ABNORMAL HIGH (ref 70–99)
Phosphorus: 5.5 mg/dL — ABNORMAL HIGH (ref 2.5–4.6)
Potassium: 4.6 mmol/L (ref 3.5–5.1)
Sodium: 134 mmol/L — ABNORMAL LOW (ref 135–145)

## 2022-05-02 MED ORDER — CEFAZOLIN SODIUM-DEXTROSE 2-4 GM/100ML-% IV SOLN
2.0000 g | INTRAVENOUS | Status: DC
  Administered 2022-05-03: 2 g via INTRAVENOUS
  Filled 2022-05-02: qty 100

## 2022-05-02 MED ORDER — CEFAZOLIN SODIUM-DEXTROSE 1-4 GM/50ML-% IV SOLN
1.0000 g | Freq: Once | INTRAVENOUS | Status: AC
  Administered 2022-05-02: 1 g via INTRAVENOUS
  Filled 2022-05-02: qty 50

## 2022-05-02 MED ORDER — FERRIC CITRATE 1 GM 210 MG(FE) PO TABS
420.0000 mg | ORAL_TABLET | Freq: Three times a day (TID) | ORAL | Status: DC
  Administered 2022-05-02 – 2022-05-03 (×2): 420 mg via ORAL
  Filled 2022-05-02 (×6): qty 2

## 2022-05-02 NOTE — Progress Notes (Signed)
*  PRELIMINARY RESULTS* Echocardiogram 2D Echocardiogram has been performed.  Amy Moses 05/02/2022, 3:35 PM

## 2022-05-02 NOTE — Progress Notes (Signed)
PROGRESS NOTE    Shefali Ng  YYT:[id number] DOB: 07-04-1971 DOA: 04/29/2022 PCP: Emelda Fear, DO    Brief Narrative:  51 year old female with history of diabetes, end-stage renal disease on hemodialysis, had initially come to the emergency room when she was feeling feverish and had chills.  Blood cultures were drawn at that time.  No clear source of infection was found and she was discharged.  Her cultures returned positive for gram-negative rods.  She was called back to the hospital for admission and started on antibiotics.   Assessment & Plan:   Principal Problem:   Bacteremia Active Problems:   Type 2 diabetes mellitus with ESRD (end-stage renal disease) (HCC)   Acquired hypothyroidism   Mixed hyperlipidemia   Essential hypertension   Non-healing open wound of heel   Obesity (BMI 30-39.9)   GERD (gastroesophageal reflux disease)   Citrobacter bacteremia -Source of bacteria is not entirely clear -She does not have any GI symptoms -Wounds from her lower extremities appear to be clean without signs of active infection -Initially on cefepime, but based on culture data she has been changed to cefazolin -Discussed with infectious disease, Dr. Baxter Flattery -2D echocardiogram does not show any obvious vegetations -MRI of her right foot indicates possible underlying osteomyelitis.  This was reviewed with her primary orthopedist who recommended continued antibiotics and he will have close follow-up with her later this week in the office. -We will discuss with infectious disease regarding antibiotic regimen  End-stage renal disease -HD TTS -Nephrology following  Cardiomyopathy -Echocardiogram is mildly reduced at 45 to 50% -No recent chest pain -Suspect she can follow-up with cardiology as an outpatient for further work-up  Type 2 diabetes -Continued on basal insulin -Sliding scale insulin  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 -She is followed by  orthopedics -She has compression dressings on both lower extremities for lymphedema -Recently saw her orthopedist in the last few days and his wounds were noted to be without infections  Obesity -BMI 33.8  Hypothyroidism -Continue Synthroid  Hyperlipidemia -Continue statin   DVT prophylaxis: enoxaparin (LOVENOX) injection 30 mg Start: 04/30/22 1000 SCDs Start: 04/30/22 2751  Code Status: Full code Family Communication: Discussed with patient Disposition Plan: Status is: Inpatient Remains inpatient appropriate because: Continued IV antibiotics pending culture data     Consultants:  Nephrology  Procedures:    Antimicrobials:  Cefepime 6/10 to 6/12 Ancef 6/12   Subjective: No new complaints.  Objective: Vitals:   05/01/22 1436 05/01/22 2057 05/02/22 0439 05/02/22 1244  BP: 118/76 112/71 100/65 120/73  Pulse: 76 73 74 72  Resp: 18 18 18 16   Temp: 98.8 F (37.1 C) 98.7 F (37.1 C) 98 F (36.7 C) 98.4 F (36.9 C)  TempSrc: Oral Oral Oral   SpO2: 100% 99% 97% 100%  Weight:      Height:        Intake/Output Summary (Last 24 hours) at 05/02/2022 1936 Last data filed at 05/02/2022 1831 Gross per 24 hour  Intake 1169.44 ml  Output 1 ml  Net 1168.44 ml   Filed Weights   04/29/22 2145 04/30/22 1537 04/30/22 1624  Weight: 97.5 kg 97.5 kg 95 kg    Examination:  General exam: Appears calm and comfortable  Respiratory system: Clear to auscultation. Respiratory effort normal. Cardiovascular system: S1 & S2 heard, RRR. No JVD, murmurs, rubs, gallops or clicks. No pedal edema. Gastrointestinal system: Abdomen is nondistended, soft and nontender. No organomegaly or masses felt. Normal bowel sounds heard. Central nervous  system: Alert and oriented. No focal neurological deficits. Extremities: Symmetric 5 x 5 power. Skin: Currently with dressings in place Psychiatry: Judgement and insight appear normal. Mood & affect appropriate.    Data Reviewed: I have  personally reviewed following labs and imaging studies  CBC: Recent Labs  Lab 04/28/22 2331 04/29/22 2240 04/30/22 0631  WBC 13.1* 9.0 8.9  NEUTROABS 11.9* 7.5  --   HGB 11.6* 11.2* 10.5*  HCT 38.2 36.5 34.9*  MCV 95.0 94.6 95.6  PLT 154 157 240*   Basic Metabolic Panel: Recent Labs  Lab 04/28/22 2331 04/29/22 2240 04/30/22 0631 05/02/22 0435  NA 137 134* 134* 134*  K 5.0 4.8 5.1 4.6  CL 100 98 100 101  CO2 26 26 23 24   GLUCOSE 243* 146* 180* 174*  BUN 35* 43* 46* 44*  CREATININE 5.12* 6.20* 6.54* 6.27*  CALCIUM 9.0 9.2 8.8* 8.7*  MG  --   --  2.1  --   PHOS  --   --  5.3* 5.5*   GFR: Estimated Creatinine Clearance: 12.7 mL/min (A) (by C-G formula based on SCr of 6.27 mg/dL (H)). Liver Function Tests: Recent Labs  Lab 04/28/22 2331 04/29/22 2240 04/30/22 0631 05/02/22 0435  AST 14* 15 14*  --   ALT 13 15 13   --   ALKPHOS 121 114 108  --   BILITOT 0.8 1.2 1.1  --   PROT 7.0 7.0 6.5  --   ALBUMIN 3.7 3.5 3.3* 3.1*   No results for input(s): "LIPASE", "AMYLASE" in the last 168 hours. No results for input(s): "AMMONIA" in the last 168 hours. Coagulation Profile: Recent Labs  Lab 04/29/22 2240  INR 1.2   Cardiac Enzymes: No results for input(s): "CKTOTAL", "CKMB", "CKMBINDEX", "TROPONINI" in the last 168 hours. BNP (last 3 results) No results for input(s): "PROBNP" in the last 8760 hours. HbA1C: No results for input(s): "HGBA1C" in the last 72 hours. CBG: Recent Labs  Lab 05/01/22 1638 05/01/22 2059 05/02/22 0720 05/02/22 1115 05/02/22 1622  GLUCAP 190* 220* 153* 213* 179*   Lipid Profile: No results for input(s): "CHOL", "HDL", "LDLCALC", "TRIG", "CHOLHDL", "LDLDIRECT" in the last 72 hours. Thyroid Function Tests: No results for input(s): "TSH", "T4TOTAL", "FREET4", "T3FREE", "THYROIDAB" in the last 72 hours. Anemia Panel: No results for input(s): "VITAMINB12", "FOLATE", "FERRITIN", "TIBC", "IRON", "RETICCTPCT" in the last 72 hours. Sepsis  Labs: Recent Labs  Lab 04/29/22 2240 04/30/22 0123  LATICACIDVEN 1.2 1.2    Recent Results (from the past 240 hour(s))  Blood culture (routine x 2)     Status: Abnormal   Collection Time: 04/28/22 11:31 PM   Specimen: BLOOD  Result Value Ref Range Status   Specimen Description   Final    BLOOD RIGHT ANTECUBITAL Performed at [facility], [place] [address] [place], [place] [address]    Special Requests   Final    BOTTLES DRAWN AEROBIC AND ANAEROBIC Blood Culture adequate volume Performed at [facility], [address], [place], [place] [address]    Culture  Setup Time   Final    GRAM NEGATIVE RODS ANAEROBIC BOTTLE ONLY Gram Stain Report Called to,Read Back By and Verified With: MARVA FIMMS AT 15:29 ON 04/29/22 BY SA CRITICAL RESULT CALLED TO, READ BACK BY AND VERIFIED WITH: RN ANGIE COE ON 04/29/22 @ 2145 BY DRT Performed at [facility], [place] [address] [place], [place] [address]    Culture CITROBACTER KOSERI (A)  Final   Report Status  05/01/2022 FINAL  Final   Organism ID, Bacteria CITROBACTER KOSERI  Final      Susceptibility   Citrobacter koseri - MIC*    CEFAZOLIN <=4 SENSITIVE Sensitive     CEFEPIME <=0.12 SENSITIVE Sensitive     CEFTAZIDIME <=1 SENSITIVE Sensitive     CEFTRIAXONE <=0.25 SENSITIVE Sensitive     CIPROFLOXACIN <=0.25 SENSITIVE Sensitive     GENTAMICIN <=1 SENSITIVE Sensitive     IMIPENEM <=0.25 SENSITIVE Sensitive     TRIMETH/SULFA <=20 SENSITIVE Sensitive     PIP/TAZO 16 SENSITIVE Sensitive     * CITROBACTER KOSERI  Blood Culture ID Panel (Reflexed)     Status: Abnormal   Collection Time: 04/28/22 11:31 PM  Result Value Ref Range Status   Enterococcus faecalis NOT DETECTED NOT DETECTED Final   Enterococcus Faecium NOT DETECTED NOT DETECTED Final   Listeria monocytogenes NOT DETECTED NOT DETECTED Final   Staphylococcus species NOT DETECTED NOT DETECTED Final   Staphylococcus aureus (BCID) NOT DETECTED NOT DETECTED Final   Staphylococcus  epidermidis NOT DETECTED NOT DETECTED Final   Staphylococcus lugdunensis NOT DETECTED NOT DETECTED Final   Streptococcus species NOT DETECTED NOT DETECTED Final   Streptococcus agalactiae NOT DETECTED NOT DETECTED Final   Streptococcus pneumoniae NOT DETECTED NOT DETECTED Final   Streptococcus pyogenes NOT DETECTED NOT DETECTED Final   A.calcoaceticus-baumannii NOT DETECTED NOT DETECTED Final   Bacteroides fragilis NOT DETECTED NOT DETECTED Final   Enterobacterales DETECTED (A) NOT DETECTED Final    Comment: Enterobacterales represent a large order of gram negative bacteria, not a single organism. Refer to culture for further identification. CRITICAL RESULT CALLED TO, READ BACK BY AND VERIFIED WITH: RN ANGIE COE ON 04/29/22 @ 2145 BY DRT    Enterobacter cloacae complex NOT DETECTED NOT DETECTED Final   Escherichia coli NOT DETECTED NOT DETECTED Final   Klebsiella aerogenes NOT DETECTED NOT DETECTED Final   Klebsiella oxytoca NOT DETECTED NOT DETECTED Final   Klebsiella pneumoniae NOT DETECTED NOT DETECTED Final   Proteus species NOT DETECTED NOT DETECTED Final   Salmonella species NOT DETECTED NOT DETECTED Final   Serratia marcescens NOT DETECTED NOT DETECTED Final   Haemophilus influenzae NOT DETECTED NOT DETECTED Final   Neisseria meningitidis NOT DETECTED NOT DETECTED Final   Pseudomonas aeruginosa NOT DETECTED NOT DETECTED Final   Stenotrophomonas maltophilia NOT DETECTED NOT DETECTED Final   Candida albicans NOT DETECTED NOT DETECTED Final   Candida auris NOT DETECTED NOT DETECTED Final   Candida glabrata NOT DETECTED NOT DETECTED Final   Candida krusei NOT DETECTED NOT DETECTED Final   Candida parapsilosis NOT DETECTED NOT DETECTED Final   Candida tropicalis NOT DETECTED NOT DETECTED Final   Cryptococcus neoformans/gattii NOT DETECTED NOT DETECTED Final   CTX-M ESBL NOT DETECTED NOT DETECTED Final   Carbapenem resistance IMP NOT DETECTED NOT DETECTED Final   Carbapenem  resistance KPC NOT DETECTED NOT DETECTED Final   Carbapenem resistance NDM NOT DETECTED NOT DETECTED Final   Carbapenem resist OXA 48 LIKE NOT DETECTED NOT DETECTED Final   Carbapenem resistance VIM NOT DETECTED NOT DETECTED Final    Comment: Performed at [facility], [address], [place], [place] [address]  SARS Coronavirus 2 by RT PCR (hospital order, performed in [facility] hospital lab) *cepheid single result test* Anterior Nasal Swab     Status: None   Collection Time: 04/29/22  1:14 AM   Specimen: Anterior Nasal Swab  Result Value Ref Range Status   SARS Coronavirus  2 by RT PCR NEGATIVE NEGATIVE Final    Comment: (NOTE) SARS-CoV-2 target nucleic acids are NOT DETECTED.  The SARS-CoV-2 RNA is generally detectable in upper and lower respiratory specimens during the acute phase of infection. The lowest concentration of SARS-CoV-2 viral copies this assay can detect is 250 copies / mL. A negative result does not preclude SARS-CoV-2 infection and should not be used as the sole basis for treatment or other patient management decisions.  A negative result may occur with improper specimen collection / handling, submission of specimen other than nasopharyngeal swab, presence of viral mutation(s) within the areas targeted by this assay, and inadequate number of viral copies (<250 copies / mL). A negative result must be combined with clinical observations, patient history, and epidemiological information.  Fact Sheet for Patients:   https://www.patel.info/  Fact Sheet for Healthcare Providers: https://hall.com/  This test is not yet approved or  cleared by the [place] FDA and has been authorized for detection and/or diagnosis of SARS-CoV-2 by FDA under an Emergency Use Authorization (EUA).  This EUA will remain in effect (meaning this test can be used) for the duration of the COVID-19 declaration under Section 564(b)(1) of the  Act, 21 U.S.C. section 360bbb-3(b)(1), unless the authorization is terminated or revoked sooner.  Performed at [name], [address], [place], [place] [address]   Resp Panel by RT-PCR (Flu A&B, Covid) Anterior Nasal Swab     Status: None   Collection Time: 04/29/22 10:02 PM   Specimen: Anterior Nasal Swab  Result Value Ref Range Status   SARS Coronavirus 2 by RT PCR NEGATIVE NEGATIVE Final    Comment: (NOTE) SARS-CoV-2 target nucleic acids are NOT DETECTED.  The SARS-CoV-2 RNA is generally detectable in upper respiratory specimens during the acute phase of infection. The lowest concentration of SARS-CoV-2 viral copies this assay can detect is 138 copies/mL. A negative result does not preclude SARS-Cov-2 infection and should not be used as the sole basis for treatment or other patient management decisions. A negative result may occur with  improper specimen collection/handling, submission of specimen other than nasopharyngeal swab, presence of viral mutation(s) within the areas targeted by this assay, and inadequate number of viral copies(<138 copies/mL). A negative result must be combined with clinical observations, patient history, and epidemiological information. The expected result is Negative.  Fact Sheet for Patients:  EntrepreneurPulse.com.au  Fact Sheet for Healthcare Providers:  IncredibleEmployment.be  This test is no t yet approved or cleared by the [place] FDA and  has been authorized for detection and/or diagnosis of SARS-CoV-2 by FDA under an Emergency Use Authorization (EUA). This EUA will remain  in effect (meaning this test can be used) for the duration of the COVID-19 declaration under Section 564(b)(1) of the Act, 21 U.S.C.section 360bbb-3(b)(1), unless the authorization is terminated  or revoked sooner.       Influenza A by PCR NEGATIVE NEGATIVE Final   Influenza B by PCR NEGATIVE NEGATIVE Final    Comment:  (NOTE) The Xpert Xpress SARS-CoV-2/FLU/RSV plus assay is intended as an aid in the diagnosis of influenza from Nasopharyngeal swab specimens and should not be used as a sole basis for treatment. Nasal washings and aspirates are unacceptable for Xpert Xpress SARS-CoV-2/FLU/RSV testing.  Fact Sheet for Patients: EntrepreneurPulse.com.au  Fact Sheet for Healthcare Providers: IncredibleEmployment.be  This test is not yet approved or cleared by the [place] FDA and has been authorized for detection and/or diagnosis of SARS-CoV-2 by FDA under an Emergency Use  Authorization (EUA). This EUA will remain in effect (meaning this test can be used) for the duration of the COVID-19 declaration under Section 564(b)(1) of the Act, 21 U.S.C. section 360bbb-3(b)(1), unless the authorization is terminated or revoked.  Performed at [name], [address], [place], [place] [address]   Blood Culture (routine x 2)     Status: None (Preliminary result)   Collection Time: 04/29/22 10:24 PM   Specimen: Right Antecubital; Blood  Result Value Ref Range Status   Specimen Description   Final    RIGHT ANTECUBITAL BOTTLES DRAWN AEROBIC AND ANAEROBIC   Special Requests   Final    Blood Culture results may not be optimal due to an excessive volume of blood received in culture bottles   Culture   Final    NO GROWTH 2 DAYS Performed at [facility], [address] [place], [place] [address]    Report Status PENDING  Incomplete  Blood culture (routine x 2)     Status: None (Preliminary result)   Collection Time: 04/29/22 10:34 PM   Specimen: Right Antecubital; Blood  Result Value Ref Range Status   Specimen Description RIGHT ANTECUBITAL  Final   Special Requests   Final    BOTTLES DRAWN AEROBIC AND ANAEROBIC Blood Culture results may not be optimal due to an excessive volume of blood received in culture bottles   Culture   Final    NO GROWTH 3 DAYS Performed at  [facility], [address], [place], [place] [address]    Report Status PENDING  Incomplete  Blood Culture (routine x 2)     Status: Abnormal   Collection Time: 04/29/22 10:41 PM   Specimen: Right Antecubital; Blood  Result Value Ref Range Status   Specimen Description   Final    RIGHT ANTECUBITAL BOTTLES DRAWN AEROBIC AND ANAEROBIC Performed at [facility], [address] [place], [place] [address]    Special Requests   Final    Blood Culture adequate volume Performed at [facility], [address] [place], [place] [address]    Culture  Setup Time   Final    GRAM NEGATIVE RODS AEROBIC BOTTLE ONLY Gram Stain Report Called to,Read Back By and Verified With: PREVIOUSLY CALLED TO MARVA FIMMS 1529 04/29/22. WORK DONE AT [facility]. CRITICAL VALUE NOTED.  VALUE IS CONSISTENT WITH PREVIOUSLY REPORTED AND CALLED VALUE.    Culture (A)  Final    CITROBACTER KOSERI SUSCEPTIBILITIES PERFORMED ON PREVIOUS CULTURE WITHIN THE LAST 5 DAYS. Performed at [facility], [name] [address] [place], [place] [address]    Report Status 05/02/2022 FINAL  Final         Radiology Studies: ECHOCARDIOGRAM COMPLETE  Result Date: 05/02/2022    ECHOCARDIOGRAM REPORT   Patient Name:   ALFONSO SHACKETT Date of Exam: 05/02/2022 Medical Rec #:  [id number]       Height:       66.0 in Accession #:    [id number]      Weight:       209.4 lb Date of Birth:  [birth year]      BSA:          2.039 m Patient Age:    22 years        BP:           100/65 mmHg Patient Gender: F               HR:  72 bpm. Exam Location:  [facility] Procedure: 2D Echo, Cardiac Doppler and Color Doppler Indications:    Bacteremia  History:        Patient has prior history of Echocardiogram examinations, most                 recent 04/01/2021. CHF, Signs/Symptoms:Bacteremia; Risk                 Factors:Hypertension, Diabetes and Dyslipidemia.  Sonographer:    Wenda Low Referring Phys: [place]  1. Left ventricular  ejection fraction, by estimation, is 45 to 50%. Left ventricular ejection fraction by PLAX is 46 %. The left ventricle has mildly decreased function. The left ventricle demonstrates global hypokinesis. Left ventricular diastolic parameters are consistent with Grade II diastolic dysfunction (pseudonormalization). Elevated left ventricular end-diastolic pressure.  2. Right ventricular systolic function is mildly reduced. The right ventricular size is normal. There is moderately elevated pulmonary artery systolic pressure. The estimated right ventricular systolic pressure is 44.8 mmHg.  3. Left atrial size was severely dilated.  4. Right atrial size was mildly dilated.  5. The pericardial effusion is circumferential. There is no evidence of cardiac tamponade.  6. The mitral valve is abnormal. Mild mitral valve regurgitation.  7. Tricuspid valve regurgitation is mild to moderate.  8. The aortic valve is tricuspid. Aortic valve regurgitation is not visualized. Aortic valve sclerosis/calcification is present, without any evidence of aortic stenosis. Aortic valve mean gradient measures 5.0 mmHg.  9. The inferior vena cava is dilated in size with >50% respiratory variability, suggesting right atrial pressure of 8 mmHg. Comparison(s): Changes from prior study are noted. 04/01/2021: LVEF 55-60%, grade 2 DD. FINDINGS  Left Ventricle: Left ventricular ejection fraction, by estimation, is 45 to 50%. Left ventricular ejection fraction by PLAX is 46 %. The left ventricle has mildly decreased function. The left ventricle demonstrates global hypokinesis. The left ventricular internal cavity size was normal in size. There is no left ventricular hypertrophy. Left ventricular diastolic parameters are consistent with Grade II diastolic dysfunction (pseudonormalization). Elevated left ventricular end-diastolic pressure. Right Ventricle: The right ventricular size is normal. No increase in right ventricular wall thickness. Right  ventricular systolic function is mildly reduced. There is moderately elevated pulmonary artery systolic pressure. The tricuspid regurgitant velocity is 3.39 m/s, and with an assumed right atrial pressure of 8 mmHg, the estimated right ventricular systolic pressure is 18.5 mmHg. Left Atrium: Left atrial size was severely dilated. Right Atrium: Right atrial size was mildly dilated. Pericardium: Trivial pericardial effusion is present. The pericardial effusion is circumferential. There is no evidence of cardiac tamponade. Mitral Valve: The mitral valve is abnormal. Mild mitral annular calcification. Mild mitral valve regurgitation. MV peak gradient, 5.3 mmHg. The mean mitral valve gradient is 2.0 mmHg. Tricuspid Valve: The tricuspid valve is grossly normal. Tricuspid valve regurgitation is mild to moderate. Aortic Valve: The aortic valve is tricuspid. Aortic valve regurgitation is not visualized. Aortic valve sclerosis/calcification is present, without any evidence of aortic stenosis. Aortic valve mean gradient measures 5.0 mmHg. Aortic valve peak gradient measures 9.6 mmHg. Aortic valve area, by VTI measures 1.96 cm. Pulmonic Valve: The pulmonic valve was normal in structure. Pulmonic valve regurgitation is not visualized. Aorta: The aortic root and ascending aorta are structurally normal, with no evidence of dilitation. Venous: The inferior vena cava is dilated in size with greater than 50% respiratory variability, suggesting right atrial pressure of 8 mmHg. IAS/Shunts: No atrial level shunt detected by color flow  Doppler.  LEFT VENTRICLE PLAX 2D LV EF:         Left            Diastology                ventricular     LV e' medial:    3.81 cm/s                ejection        LV E/e' medial:  27.6                fraction by     LV e' lateral:   7.40 cm/s                PLAX is 46      LV E/e' lateral: 14.2                %. LVIDd:         4.80 cm LVIDs:         3.70 cm LV PW:         1.00 cm LV IVS:        0.90 cm  LVOT diam:     1.80 cm LV SV:         68 LV SV Index:   33 LVOT Area:     2.54 cm  LV Volumes (MOD) LV vol d, MOD    73.2 ml A2C: LV vol d, MOD    84.7 ml A4C: LV vol s, MOD    41.5 ml A2C: LV vol s, MOD    44.0 ml A4C: LV SV MOD A2C:   31.7 ml LV SV MOD A4C:   84.7 ml LV SV MOD BP:    34.8 ml RIGHT VENTRICLE RV Basal diam:  3.85 cm RV S prime:     9.14 cm/s TAPSE (M-mode): 2.0 cm LEFT ATRIUM           Index        RIGHT ATRIUM           Index LA diam:      4.20 cm 2.06 cm/m   RA Area:     20.90 cm LA Vol (A2C): 61.3 ml 30.06 ml/m  RA Volume:   63.40 ml  31.09 ml/m LA Vol (A4C): 99.1 ml 48.59 ml/m  AORTIC VALVE                     PULMONIC VALVE AV Area (Vmax):    1.67 cm      PV Vmax:       0.76 m/s AV Area (Vmean):   1.76 cm      PV Peak grad:  2.3 mmHg AV Area (VTI):     1.96 cm AV Vmax:           155.00 cm/s AV Vmean:          103.000 cm/s AV VTI:            0.347 m AV Peak Grad:      9.6 mmHg AV Mean Grad:      5.0 mmHg LVOT Vmax:         102.00 cm/s LVOT Vmean:        71.100 cm/s LVOT VTI:          0.267 m LVOT/AV VTI ratio: 0.77  AORTA Ao Root diam: 2.80 cm MITRAL VALVE  TRICUSPID VALVE MV Area (PHT): 3.13 cm     TR Peak grad:   46.0 mmHg MV Area VTI:   1.86 cm     TR Vmax:        339.00 cm/s MV Peak grad:  5.3 mmHg MV Mean grad:  2.0 mmHg     SHUNTS MV Vmax:       1.15 m/s     Systemic VTI:  0.27 m MV Vmean:      68.3 cm/s    Systemic Diam: 1.80 cm MV Decel Time: 242 msec MV E velocity: 105.00 cm/s MV A velocity: 78.40 cm/s MV E/A ratio:  1.34 Lyman Bishop MD Electronically signed by Lyman Bishop MD Signature Date/Time: 05/02/2022/6:07:34 PM    Final    MR FOOT RIGHT WO CONTRAST  Result Date: 05/02/2022 CLINICAL DATA:  Soft tissue infection of the lower leg and foot. EXAM: MRI OF LOWER RIGHT EXTREMITY WITHOUT CONTRAST; MRI OF THE RIGHT FOREFOOT WITHOUT CONTRAST TECHNIQUE: Multiplanar, multisequence MR imaging of the right lower leg was performed. No intravenous contrast was  administered. Multiplanar, multisequence MR imaging of the right foot was performed. No intravenous contrast was administered. COMPARISON:  None Available. FINDINGS: Bones/Joint/Cartilage Soft tissue wound overlying the lateral aspect of the cuboid with mild subcortical bone marrow edema in the distal lateral corner concerning for osteomyelitis. Orthopedic hardware in the distal fibula and tibia with resulting susceptibility artifact partially obscuring the adjacent soft tissue and osseous structures. No fracture or dislocation. Old posttraumatic deformity of the second metatarsal. Normal alignment. No joint effusion. Small amount of fluid in the first intermetatarsal bursa. Severe osteoarthritis of the first MTP joint. Moderate osteoarthritis of the first IP joint. Mild osteoarthritis of the remainder of the MTP joints. Prior fifth metatarsal amputation. Mild osteoarthritis of the first TMT joint. Ligaments Collateral ligaments are intact.  Lisfranc ligament is intact. Muscles and Tendons Flexor, peroneal and extensor compartment tendons are intact. T2 hyperintense signal in the plantar musculature likely neurogenic. Generalized muscle atrophy of the right lower leg. Soft tissue No fluid collection or hematoma. No soft tissue mass. Soft tissue edema throughout the right lower leg and foot which may be reactive secondary to fluid overload/venous insufficiency versus cellulitis. No drainable fluid collection to suggest an abscess. IMPRESSION: 1. Soft tissue wound overlying the lateral aspect of the cuboid with mild subcortical bone marrow edema in the distal lateral corner concerning for osteomyelitis. 2. Soft tissue edema throughout the right lower leg and foot which may be reactive secondary to fluid overload/venous insufficiency versus cellulitis. No drainable fluid collection to suggest an abscess. 3. Severe osteoarthritis of the first MTP joint. Electronically Signed   By: Kathreen Devoid M.D.   On: 05/02/2022  15:12   MR TIBIA FIBULA RIGHT WO CONTRAST  Result Date: 05/02/2022 CLINICAL DATA:  Soft tissue infection of the lower leg and foot. EXAM: MRI OF LOWER RIGHT EXTREMITY WITHOUT CONTRAST; MRI OF THE RIGHT FOREFOOT WITHOUT CONTRAST TECHNIQUE: Multiplanar, multisequence MR imaging of the right lower leg was performed. No intravenous contrast was administered. Multiplanar, multisequence MR imaging of the right foot was performed. No intravenous contrast was administered. COMPARISON:  None Available. FINDINGS: Bones/Joint/Cartilage Soft tissue wound overlying the lateral aspect of the cuboid with mild subcortical bone marrow edema in the distal lateral corner concerning for osteomyelitis. Orthopedic hardware in the distal fibula and tibia with resulting susceptibility artifact partially obscuring the adjacent soft tissue and osseous structures. No fracture or dislocation. Old posttraumatic deformity of the second  metatarsal. Normal alignment. No joint effusion. Small amount of fluid in the first intermetatarsal bursa. Severe osteoarthritis of the first MTP joint. Moderate osteoarthritis of the first IP joint. Mild osteoarthritis of the remainder of the MTP joints. Prior fifth metatarsal amputation. Mild osteoarthritis of the first TMT joint. Ligaments Collateral ligaments are intact.  Lisfranc ligament is intact. Muscles and Tendons Flexor, peroneal and extensor compartment tendons are intact. T2 hyperintense signal in the plantar musculature likely neurogenic. Generalized muscle atrophy of the right lower leg. Soft tissue No fluid collection or hematoma. No soft tissue mass. Soft tissue edema throughout the right lower leg and foot which may be reactive secondary to fluid overload/venous insufficiency versus cellulitis. No drainable fluid collection to suggest an abscess. IMPRESSION: 1. Soft tissue wound overlying the lateral aspect of the cuboid with mild subcortical bone marrow edema in the distal lateral corner  concerning for osteomyelitis. 2. Soft tissue edema throughout the right lower leg and foot which may be reactive secondary to fluid overload/venous insufficiency versus cellulitis. No drainable fluid collection to suggest an abscess. 3. Severe osteoarthritis of the first MTP joint. Electronically Signed   By: Kathreen Devoid M.D.   On: 05/02/2022 15:12        Scheduled Meds:  atorvastatin  10 mg Oral Daily   Chlorhexidine Gluconate Cloth  6 each Topical Q0600   enoxaparin (LOVENOX) injection  30 mg Subcutaneous Q24H   ferric citrate  420 mg Oral TID WC   insulin aspart  0-5 Units Subcutaneous QHS   insulin aspart  0-6 Units Subcutaneous TID WC   insulin glargine-yfgn  10 Units Subcutaneous QHS   levothyroxine  50 mcg Oral QAC breakfast   pantoprazole  40 mg Oral BID   silver sulfADIAZINE   Topical Daily   Continuous Infusions:   ceFAZolin (ANCEF) IV     [START ON 05/03/2022]  ceFAZolin (ANCEF) IV       LOS: 3 days    Time spent: 20mins    Kathie Dike, MD [facility]   If 7PM-7AM, please contact night-coverage [email or web address]  05/02/2022, 7:36 PM

## 2022-05-02 NOTE — Progress Notes (Signed)
Patient ID: Amy Moses, female   DOB: February 03, 1971, 51 y.o.   MRN: [id number]  [facility] Progress Note    Subjective:    Feels well, no complaints.   Objective:   BP 100/65 (BP Location: Right Arm)   Pulse 74   Temp 98 F (36.7 C) (Oral)   Resp 18   Ht 5\' 6"  (1.676 m)   Wt 95 kg   LMP 08/22/2015 (Approximate)   SpO2 97%   BMI 33.80 kg/m   Intake/Output: I/O last 3 completed shifts: In: 1640 [P.O.:1440; IV Piggyback:200] Out: 1 [Stool:1]   Intake/Output this shift:  No intake/output data recorded. Weight change:   Physical Exam: Gen:NAD CVS: RRR Resp: CTA Abd: +BS, soft, NT/ND Ext: legs wrapped, + edema of thighs, LUE AVF +T/B  Labs: BMET Recent Labs  Lab 04/28/22 2331 04/29/22 2240 04/30/22 0631 05/02/22 0435  NA 137 134* 134* 134*  K 5.0 4.8 5.1 4.6  CL 100 98 100 101  CO2 26 26 23 24   GLUCOSE 243* 146* 180* 174*  BUN 35* 43* 46* 44*  CREATININE 5.12* 6.20* 6.54* 6.27*  ALBUMIN 3.7 3.5 3.3* 3.1*  CALCIUM 9.0 9.2 8.8* 8.7*  PHOS  --   --  5.3* 5.5*   CBC Recent Labs  Lab 04/28/22 2331 04/29/22 2240 04/30/22 0631  WBC 13.1* 9.0 8.9  NEUTROABS 11.9* 7.5  --   HGB 11.6* 11.2* 10.5*  HCT 38.2 36.5 34.9*  MCV 95.0 94.6 95.6  PLT 154 157 145*      Medications:     atorvastatin  10 mg Oral Daily   Chlorhexidine Gluconate Cloth  6 each Topical Q0600   enoxaparin (LOVENOX) injection  30 mg Subcutaneous Q24H   ferric citrate  420 mg Oral BID WC   insulin aspart  0-5 Units Subcutaneous QHS   insulin aspart  0-6 Units Subcutaneous TID WC   insulin glargine-yfgn  10 Units Subcutaneous QHS   levothyroxine  50 mcg Oral QAC breakfast   pantoprazole  40 mg Oral BID   silver sulfADIAZINE   Topical Daily   Outpatient dialysis:  [name], TTS EDW 93 kg, time 4 hours, 2K/2.5Ca, BFR 400, DFR 800, Access:  LUE AVF EPO 8,000 units TIW   Assessment/ Plan:   Enterobacterales bacteremia:  unclear source.  For MRI of feet and  legs today.  May need GI imaging as well. 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

## 2022-05-02 NOTE — TOC Initial Note (Signed)
Transition of Care [facility]) - Initial/Assessment Note    Patient Details  Name: Amy Moses MRN: [id number] Date of Birth: 1971/01/06  Transition of Care [facility]) CM/SW Contact:    Iona Beard, [place] Phone Number: 05/02/2022, 9:35 AM  Clinical Narrative:                 Pt is high risk for readmission. CSW spoke with pt to complete assessment. Pt states that she lives with her daughter and her boyfriend. Pt states that she at times needs assistance with completing her ADLs. Pt states that she has friends and family that assist with transportation. Pt uses a wheelchair and bedside commode in the home. Pt gets dialysis at [facility] on [address]. TOC to follow.   Expected Discharge Plan: Home/Self Care Barriers to Discharge: Continued Medical Work up   Patient Goals and CMS Choice Patient states their goals for this hospitalization and ongoing recovery are:: return home CMS Medicare.gov Compare Post Acute Care list provided to:: Patient Choice offered to / list presented to : Patient  Expected Discharge Plan and Services Expected Discharge Plan: Home/Self Care       Living arrangements for the past 2 months: Single Family Home                                      Prior Living Arrangements/Services Living arrangements for the past 2 months: Single Family Home Lives with:: Adult Children, Significant Other Patient language and need for interpreter reviewed:: Yes Do you feel safe going back to the place where you live?: Yes      Need for Family Participation in Patient Care: Yes (Comment) Care giver support system in place?: Yes (comment) Current home services: DME Criminal Activity/Legal Involvement Pertinent to Current Situation/Hospitalization: No - Comment as needed  Activities of Daily Living Home Assistive Devices/Equipment: Hospital bed, Wheelchair ADL Screening (condition at time of admission) Patient's cognitive ability adequate to safely complete daily  activities?: Yes Is the patient deaf or have difficulty hearing?: No Does the patient have difficulty seeing, even when wearing glasses/contacts?: No Does the patient have difficulty concentrating, remembering, or making decisions?: No Patient able to express need for assistance with ADLs?: Yes Does the patient have difficulty dressing or bathing?: Yes Independently performs ADLs?: No Communication: Independent Dressing (OT): Needs assistance Is this a change from baseline?: Pre-admission baseline Grooming: Needs assistance Is this a change from baseline?: Pre-admission baseline Feeding: Independent Bathing: Needs assistance Is this a change from baseline?: Pre-admission baseline Toileting: Needs assistance Is this a change from baseline?: Pre-admission baseline In/Out Bed: Needs assistance Is this a change from baseline?: Pre-admission baseline Walks in Home: Dependent Is this a change from baseline?: Pre-admission baseline Does the patient have difficulty walking or climbing stairs?: Yes Weakness of Legs: Both Weakness of Arms/Hands: None  Permission Sought/Granted                  Emotional Assessment Appearance:: Appears stated age Attitude/Demeanor/Rapport: Engaged Affect (typically observed): Accepting Orientation: : Oriented to Self, Oriented to Place, Oriented to  Time, Oriented to Situation Alcohol / Substance Use: Not Applicable Psych Involvement: No (comment)  Admission diagnosis:  Bacteremia [R78.81] Patient Active Problem List   Diagnosis Date Noted   GERD (gastroesophageal reflux disease) 04/30/2022   Bacteremia 04/29/2022   Non-adherence to medical treatment 03/16/2022   Cutaneous abscess of right foot    Osteomyelitis of  foot ([place]) 02/14/2022   Obesity (BMI 30-39.9) 01/26/2022   Ischemic ulcer of right foot ([place]) 01/24/2022   Normocytic anemia 01/24/2022   Sacral pressure ulcer 21/30/8657   Metabolic acidosis, increased anion gap 01/24/2022   Leg  wound, left, sequela 12/10/2021   Open leg wound 09/03/2021   Wound infection    Non-pressure chronic ulcer of right calf limited to breakdown of skin ([place])    Calciphylaxis of right lower extremity with nonhealing ulcer, limited to breakdown of skin ([place])    Chronic ulcer of left thigh ([place]) 08/07/2021   Calciphylaxis of left lower extremity with nonhealing ulcer with necrosis of muscle ([place]) 08/07/2021   Lower GI bleed 05/12/2021   Acute GI bleeding 04/24/2021   Acute blood loss anemia 04/23/2021   GI bleed 04/22/2021   Fever 03/31/2021   Acute on chronic heart failure with preserved ejection fraction (HFpEF) ([place]) 03/30/2021   End stage renal disease ([place]) 11/16/2020   Calciphylaxis 11/06/2020   Non-healing open wound of heel 11/03/2020   Diabetic foot infection ([place]) 11/01/2020   Decubitus ulcer, heel 11/01/2020   Closed nondisplaced fracture of left patella 84/69/6295   Metabolic acidosis 28/41/3244   Acute on chronic renal failure ([place]) 06/10/2020   Acute pericardial effusion 06/10/2020   Chronic kidney disease, stage 4 (severe) ([place]) 03/05/2019   Vitamin D deficiency 01/28/2019   Type 2 diabetes mellitus with ESRD (end-stage renal disease) ([place]) 09/21/2015   Mixed hyperlipidemia 09/21/2015   Essential hypertension 09/21/2015   Acquired hypothyroidism 09/21/2015   Iris bomb 07/31/2012   Secondary angle-closure glaucoma 07/31/2012   PCP:  Emelda Fear, DO Pharmacy:   [id number] - [place], [place] - [address] [place] [address] Phone: [phone] Fax: [phone]     Social Determinants of Health (SDOH) Interventions    Readmission Risk Interventions    05/02/2022    9:34 AM 01/26/2022    2:26 PM 05/14/2021    2:46 PM  Readmission Risk Prevention Plan  Transportation Screening Complete Complete Complete  PCP or Specialist Appt within 3-5 Days  Complete   HRI or [place]  Complete   Social Work Consult for  [place] Planning/Counseling  Complete   Palliative Care Screening  Not Applicable   Medication Review Press photographer) Complete Complete Complete  HRI or [place] Complete  Complete  SW Recovery Care/Counseling Consult Complete  Complete  Palliative Care Screening Not Applicable  Not [place] Not Applicable  Not Applicable

## 2022-05-03 DIAGNOSIS — K219 Gastro-esophageal reflux disease without esophagitis: Secondary | ICD-10-CM | POA: Diagnosis not present

## 2022-05-03 DIAGNOSIS — R7881 Bacteremia: Secondary | ICD-10-CM | POA: Diagnosis not present

## 2022-05-03 DIAGNOSIS — E039 Hypothyroidism, unspecified: Secondary | ICD-10-CM | POA: Diagnosis not present

## 2022-05-03 DIAGNOSIS — I10 Essential (primary) hypertension: Secondary | ICD-10-CM | POA: Diagnosis not present

## 2022-05-03 LAB — RENAL FUNCTION PANEL
Albumin: 2.9 g/dL — ABNORMAL LOW (ref 3.5–5.0)
Anion gap: 10 (ref 5–15)
BUN: 55 mg/dL — ABNORMAL HIGH (ref 6–20)
CO2: 22 mmol/L (ref 22–32)
Calcium: 8.7 mg/dL — ABNORMAL LOW (ref 8.9–10.3)
Chloride: 103 mmol/L (ref 98–111)
Creatinine, Ser: 7.16 mg/dL — ABNORMAL HIGH (ref 0.44–1.00)
GFR, Estimated: 7 mL/min — ABNORMAL LOW (ref >60)
Glucose, Bld: 180 mg/dL — ABNORMAL HIGH (ref 70–99)
Phosphorus: 5.9 mg/dL — ABNORMAL HIGH (ref 2.5–4.6)
Potassium: 5 mmol/L (ref 3.5–5.1)
Sodium: 135 mmol/L (ref 135–145)

## 2022-05-03 LAB — CBC
HCT: 34.7 % — ABNORMAL LOW (ref 36.0–46.0)
Hemoglobin: 10.5 g/dL — ABNORMAL LOW (ref 12.0–15.0)
MCH: 28.7 pg (ref 26.0–34.0)
MCHC: 30.3 g/dL (ref 30.0–36.0)
MCV: 94.8 fL (ref 80.0–100.0)
Platelets: 146 10*3/uL — ABNORMAL LOW (ref 150–400)
RBC: 3.66 MIL/uL — ABNORMAL LOW (ref 3.87–5.11)
RDW: 14.6 % (ref 11.5–15.5)
WBC: 4.9 10*3/uL (ref 4.0–10.5)
nRBC: 0 % (ref 0.0–0.2)

## 2022-05-03 MED ORDER — CEFAZOLIN SODIUM-DEXTROSE 2-4 GM/100ML-% IV SOLN: 2.0000 g | INTRAVENOUS | Status: DC

## 2022-05-03 NOTE — Care Management Important Message (Signed)
Important Message  Patient Details  Name: Amy Moses MRN: [id number] Date of Birth: [birth year]   Medicare Important Message Given:  N/A - LOS <3 / Initial given by admissions     Tommy Medal 05/03/2022, 10:48 AM

## 2022-05-03 NOTE — Progress Notes (Signed)
Discharge instructions given to patient who verbalized understanding. Wound care provided per MD order,and dressings changed to both legs ,patient tolerated procedure well. Accompanied by staff to an awaiting vehicle.

## 2022-05-03 NOTE — Discharge Summary (Signed)
Physician Discharge Summary  Amy Moses NOB:[id number] DOB: Feb 27, 1971 DOA: 04/29/2022  PCP: Emelda Fear, DO  Admit date: 04/29/2022 Discharge date: 05/03/2022  Admitted From: Home Disposition: Home  Recommendations for Outpatient Follow-up:  Follow up with PCP in 1-2 weeks Please obtain BMP/CBC in one week Follow-up with orthopedics later this week as previously scheduled Continue to follow at regular dialysis center IV antibiotics to be continued at dialysis center Referral has been made to follow-up with cardiology for further evaluation of mildly low EF  Home Health: Continue home health RN for dressing changes Equipment/Devices:  Discharge Condition: Stable CODE STATUS: Full code Diet recommendation: Heart healthy, carb modified  Brief/Interim Summary: 51 year old female with history of diabetes, end-stage renal disease on hemodialysis, had initially come to the emergency room when she was feeling feverish and had chills.  Blood cultures were drawn at that time.  No clear source of infection was found and she was discharged.  Her cultures returned positive for gram-negative rods.  She was called back to the hospital for admission and started on antibiotics.  Discharge Diagnoses:  Principal Problem:   Bacteremia Active Problems:   Type 2 diabetes mellitus with ESRD (end-stage renal disease) (HCC)   Acquired hypothyroidism   Mixed hyperlipidemia   Essential hypertension   Non-healing open wound of heel   Obesity (BMI 30-39.9)   GERD (gastroesophageal reflux disease)  Citrobacter bacteremia -Source of bacteria is not entirely clear -She does not have any GI symptoms -Initially on cefepime, but based on culture data she has been changed to cefazolin -2D echocardiogram does not show any obvious vegetations -MRI of her right foot indicates possible underlying osteomyelitis.  This was reviewed with her primary orthopedist, Dr. [name] who recommended continued antibiotics  and he will have close follow-up with her later this week in the office. -Discussed antibiotic regimen with infectious disease, Dr. Arelia Longest who agreed with 4 weeks of Ancef to be administered with dialysis.  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 [name] -continue silvadene to wounds on bilateral lower extremities, wrap left leg with compression wrap   Increase activity slowly   Complete by: As directed       Allergies as of 05/03/2022       Reactions   Ace Inhibitors Cough        Medication List     STOP taking these medications    traMADol 50 MG tablet Commonly known as: Ultram       TAKE these medications    Accu-Chek Guide Me w/Device Kit 1 Piece by Does not apply route as directed.   Accu-Chek Guide test strip Generic drug: glucose blood Use as instructed  acetaminophen 500 MG tablet Commonly known as: TYLENOL Take 1,000 mg by mouth every 6 (six) hours as needed for moderate pain.   ascorbic acid 1000 MG tablet Commonly known as: VITAMIN C Take 1 tablet (1,000 mg total) by mouth daily.   aspirin EC 81 MG tablet Take 1 tablet (81 mg  total) by mouth daily with breakfast.   atorvastatin 10 MG tablet Commonly known as: LIPITOR Take 1 tablet (10 mg total) by mouth daily. What changed: when to take this   Auryxia 1 GM 210 MG(Fe) tablet Generic drug: ferric citrate Take 420 mg by mouth 2 (two) times daily with a meal.   ceFAZolin 2-4 GM/100ML-% IVPB Commonly known as: ANCEF Inject 100 mLs (2 g total) into the vein Every Tuesday,Thursday,and Saturday with dialysis. Until 05/28/2022   FreeStyle Libre 2 Reader [name] As directed   [employer] 2 Sensor Misc 1 Piece by Does not apply route every 14 (fourteen) days.   HumaLOG KwikPen 100 UNIT/ML KwikPen Generic drug: insulin lispro Inject 5-11 Units into the skin 3 (three) times daily with meals. If eats 50% or more of meal.   insulin glargine 100 UNIT/ML injection Commonly known as: LANTUS Inject 0.2 mLs (20 Units total) into the skin at bedtime.   levothyroxine 50 MCG tablet Commonly known as: SYNTHROID Take 1 tablet (50 mcg total) by mouth daily before breakfast.   pantoprazole 40 MG tablet Commonly known as: Protonix Take 1 tablet (40 mg total) by mouth 2 (two) times daily.   pentoxifylline 400 MG CR tablet Commonly known as: TRENTAL Take 1 tablet (400 mg total) by mouth 3 (three) times daily with meals.   polyethylene glycol 17 g packet Commonly known as: MIRALAX / GLYCOLAX Take 17 g by mouth daily as needed for mild constipation.   silver sulfADIAZINE 1 % cream Commonly known as: SILVADENE Apply 1 application. topically daily. Apply to affected area daily plus dry dressing   Vitamin D (Ergocalciferol) 1.25 MG (50000 UNIT) Caps capsule Commonly known as: DRISDOL Take 50,000 Units by mouth every 14 (fourteen) days.               Discharge Care Instructions  (From admission, onward)           Start     Ordered   05/03/22 0000  Discharge wound care:       Comments: Continue wound care as per Dr. [name] -continue silvadene to wounds on  bilateral lower extremities, wrap left leg with compression wrap   05/03/22 1449            Allergies  Allergen Reactions   Ace Inhibitors Cough    Consultations: Nephrology   Procedures/Studies: ECHOCARDIOGRAM COMPLETE  Result Date: 05/02/2022    ECHOCARDIOGRAM REPORT   Patient Name:   JAKYIAH BRIONES Date of Exam: 05/02/2022 Medical Rec #:  [id number]       Height:       66.0 in Accession #:    [id number]      Weight:       209.4 lb Date of Birth:  1970/12/12      BSA:          2.039 m Patient Age:    71 years        BP:           100/65 mmHg Patient Gender: F               HR:  72 bpm. Exam Location:  [facility] Procedure: 2D Echo, Cardiac Doppler and Color Doppler Indications:    Bacteremia  History:        Patient has prior history of Echocardiogram examinations, most                 recent 04/01/2021. CHF, Signs/Symptoms:Bacteremia; Risk                 Factors:Hypertension, Diabetes and Dyslipidemia.  Sonographer:    Wenda Low Referring Phys: [place]  1. Left ventricular ejection fraction, by estimation, is 45 to 50%. Left ventricular ejection fraction by PLAX is 46 %. The left ventricle has mildly decreased function. The left ventricle demonstrates global hypokinesis. Left ventricular diastolic parameters are consistent with Grade II diastolic dysfunction (pseudonormalization). Elevated left ventricular end-diastolic pressure.  2. Right ventricular systolic function is mildly reduced. The right ventricular size is normal. There is moderately elevated pulmonary artery systolic pressure. The estimated right ventricular systolic pressure is 16.1 mmHg.  3. Left atrial size was severely dilated.  4. Right atrial size was mildly dilated.  5. The pericardial effusion is circumferential. There is no evidence of cardiac tamponade.  6. The mitral valve is abnormal. Mild mitral valve regurgitation.  7. Tricuspid valve regurgitation is mild to moderate.  8. The  aortic valve is tricuspid. Aortic valve regurgitation is not visualized. Aortic valve sclerosis/calcification is present, without any evidence of aortic stenosis. Aortic valve mean gradient measures 5.0 mmHg.  9. The inferior vena cava is dilated in size with >50% respiratory variability, suggesting right atrial pressure of 8 mmHg. Comparison(s): Changes from prior study are noted. 04/01/2021: LVEF 55-60%, grade 2 DD. FINDINGS  Left Ventricle: Left ventricular ejection fraction, by estimation, is 45 to 50%. Left ventricular ejection fraction by PLAX is 46 %. The left ventricle has mildly decreased function. The left ventricle demonstrates global hypokinesis. The left ventricular internal cavity size was normal in size. There is no left ventricular hypertrophy. Left ventricular diastolic parameters are consistent with Grade II diastolic dysfunction (pseudonormalization). Elevated left ventricular end-diastolic pressure. Right Ventricle: The right ventricular size is normal. No increase in right ventricular wall thickness. Right ventricular systolic function is mildly reduced. There is moderately elevated pulmonary artery systolic pressure. The tricuspid regurgitant velocity is 3.39 m/s, and with an assumed right atrial pressure of 8 mmHg, the estimated right ventricular systolic pressure is 09.6 mmHg. Left Atrium: Left atrial size was severely dilated. Right Atrium: Right atrial size was mildly dilated. Pericardium: Trivial pericardial effusion is present. The pericardial effusion is circumferential. There is no evidence of cardiac tamponade. Mitral Valve: The mitral valve is abnormal. Mild mitral annular calcification. Mild mitral valve regurgitation. MV peak gradient, 5.3 mmHg. The mean mitral valve gradient is 2.0 mmHg. Tricuspid Valve: The tricuspid valve is grossly normal. Tricuspid valve regurgitation is mild to moderate. Aortic Valve: The aortic valve is tricuspid. Aortic valve regurgitation is not visualized.  Aortic valve sclerosis/calcification is present, without any evidence of aortic stenosis. Aortic valve mean gradient measures 5.0 mmHg. Aortic valve peak gradient measures 9.6 mmHg. Aortic valve area, by VTI measures 1.96 cm. Pulmonic Valve: The pulmonic valve was normal in structure. Pulmonic valve regurgitation is not visualized. Aorta: The aortic root and ascending aorta are structurally normal, with no evidence of dilitation. Venous: The inferior vena cava is dilated in size with greater than 50% respiratory variability, suggesting right atrial pressure of 8 mmHg. IAS/Shunts: No atrial level shunt detected by color flow  Doppler.  LEFT VENTRICLE PLAX 2D LV EF:         Left            Diastology                ventricular     LV e' medial:    3.81 cm/s                ejection        LV E/e' medial:  27.6                fraction by     LV e' lateral:   7.40 cm/s                PLAX is 46      LV E/e' lateral: 14.2                %. LVIDd:         4.80 cm LVIDs:         3.70 cm LV PW:         1.00 cm LV IVS:        0.90 cm LVOT diam:     1.80 cm LV SV:         68 LV SV Index:   33 LVOT Area:     2.54 cm  LV Volumes (MOD) LV vol d, MOD    73.2 ml A2C: LV vol d, MOD    84.7 ml A4C: LV vol s, MOD    41.5 ml A2C: LV vol s, MOD    44.0 ml A4C: LV SV MOD A2C:   31.7 ml LV SV MOD A4C:   84.7 ml LV SV MOD BP:    34.8 ml RIGHT VENTRICLE RV Basal diam:  3.85 cm RV S prime:     9.14 cm/s TAPSE (M-mode): 2.0 cm LEFT ATRIUM           Index        RIGHT ATRIUM           Index LA diam:      4.20 cm 2.06 cm/m   RA Area:     20.90 cm LA Vol (A2C): 61.3 ml 30.06 ml/m  RA Volume:   63.40 ml  31.09 ml/m LA Vol (A4C): 99.1 ml 48.59 ml/m  AORTIC VALVE                     PULMONIC VALVE AV Area (Vmax):    1.67 cm      PV Vmax:       0.76 m/s AV Area (Vmean):   1.76 cm      PV Peak grad:  2.3 mmHg AV Area (VTI):     1.96 cm AV Vmax:           155.00 cm/s AV Vmean:          103.000 cm/s AV VTI:            0.347 m AV Peak Grad:       9.6 mmHg AV Mean Grad:      5.0 mmHg LVOT Vmax:         102.00 cm/s LVOT Vmean:        71.100 cm/s LVOT VTI:          0.267 m LVOT/AV VTI ratio: 0.77  AORTA Ao Root diam: 2.80 cm MITRAL VALVE  TRICUSPID VALVE MV Area (PHT): 3.13 cm     TR Peak grad:   46.0 mmHg MV Area VTI:   1.86 cm     TR Vmax:        339.00 cm/s MV Peak grad:  5.3 mmHg MV Mean grad:  2.0 mmHg     SHUNTS MV Vmax:       1.15 m/s     Systemic VTI:  0.27 m MV Vmean:      68.3 cm/s    Systemic Diam: 1.80 cm MV Decel Time: 242 msec MV E velocity: 105.00 cm/s MV A velocity: 78.40 cm/s MV E/A ratio:  1.34 Lyman Bishop MD Electronically signed by Lyman Bishop MD Signature Date/Time: 05/02/2022/6:07:34 PM    Final    MR FOOT RIGHT WO CONTRAST  Result Date: 05/02/2022 CLINICAL DATA:  Soft tissue infection of the lower leg and foot. EXAM: MRI OF LOWER RIGHT EXTREMITY WITHOUT CONTRAST; MRI OF THE RIGHT FOREFOOT WITHOUT CONTRAST TECHNIQUE: Multiplanar, multisequence MR imaging of the right lower leg was performed. No intravenous contrast was administered. Multiplanar, multisequence MR imaging of the right foot was performed. No intravenous contrast was administered. COMPARISON:  None Available. FINDINGS: Bones/Joint/Cartilage Soft tissue wound overlying the lateral aspect of the cuboid with mild subcortical bone marrow edema in the distal lateral corner concerning for osteomyelitis. Orthopedic hardware in the distal fibula and tibia with resulting susceptibility artifact partially obscuring the adjacent soft tissue and osseous structures. No fracture or dislocation. Old posttraumatic deformity of the second metatarsal. Normal alignment. No joint effusion. Small amount of fluid in the first intermetatarsal bursa. Severe osteoarthritis of the first MTP joint. Moderate osteoarthritis of the first IP joint. Mild osteoarthritis of the remainder of the MTP joints. Prior fifth metatarsal amputation. Mild osteoarthritis of the first TMT joint.  Ligaments Collateral ligaments are intact.  Lisfranc ligament is intact. Muscles and Tendons Flexor, peroneal and extensor compartment tendons are intact. T2 hyperintense signal in the plantar musculature likely neurogenic. Generalized muscle atrophy of the right lower leg. Soft tissue No fluid collection or hematoma. No soft tissue mass. Soft tissue edema throughout the right lower leg and foot which may be reactive secondary to fluid overload/venous insufficiency versus cellulitis. No drainable fluid collection to suggest an abscess. IMPRESSION: 1. Soft tissue wound overlying the lateral aspect of the cuboid with mild subcortical bone marrow edema in the distal lateral corner concerning for osteomyelitis. 2. Soft tissue edema throughout the right lower leg and foot which may be reactive secondary to fluid overload/venous insufficiency versus cellulitis. No drainable fluid collection to suggest an abscess. 3. Severe osteoarthritis of the first MTP joint. Electronically Signed   By: Kathreen Devoid M.D.   On: 05/02/2022 15:12   MR TIBIA FIBULA RIGHT WO CONTRAST  Result Date: 05/02/2022 CLINICAL DATA:  Soft tissue infection of the lower leg and foot. EXAM: MRI OF LOWER RIGHT EXTREMITY WITHOUT CONTRAST; MRI OF THE RIGHT FOREFOOT WITHOUT CONTRAST TECHNIQUE: Multiplanar, multisequence MR imaging of the right lower leg was performed. No intravenous contrast was administered. Multiplanar, multisequence MR imaging of the right foot was performed. No intravenous contrast was administered. COMPARISON:  None Available. FINDINGS: Bones/Joint/Cartilage Soft tissue wound overlying the lateral aspect of the cuboid with mild subcortical bone marrow edema in the distal lateral corner concerning for osteomyelitis. Orthopedic hardware in the distal fibula and tibia with resulting susceptibility artifact partially obscuring the adjacent soft tissue and osseous structures. No fracture or dislocation. Old posttraumatic deformity of the  second  metatarsal. Normal alignment. No joint effusion. Small amount of fluid in the first intermetatarsal bursa. Severe osteoarthritis of the first MTP joint. Moderate osteoarthritis of the first IP joint. Mild osteoarthritis of the remainder of the MTP joints. Prior fifth metatarsal amputation. Mild osteoarthritis of the first TMT joint. Ligaments Collateral ligaments are intact.  Lisfranc ligament is intact. Muscles and Tendons Flexor, peroneal and extensor compartment tendons are intact. T2 hyperintense signal in the plantar musculature likely neurogenic. Generalized muscle atrophy of the right lower leg. Soft tissue No fluid collection or hematoma. No soft tissue mass. Soft tissue edema throughout the right lower leg and foot which may be reactive secondary to fluid overload/venous insufficiency versus cellulitis. No drainable fluid collection to suggest an abscess. IMPRESSION: 1. Soft tissue wound overlying the lateral aspect of the cuboid with mild subcortical bone marrow edema in the distal lateral corner concerning for osteomyelitis. 2. Soft tissue edema throughout the right lower leg and foot which may be reactive secondary to fluid overload/venous insufficiency versus cellulitis. No drainable fluid collection to suggest an abscess. 3. Severe osteoarthritis of the first MTP joint. Electronically Signed   By: Kathreen Devoid M.D.   On: 05/02/2022 15:12   DG Chest Port 1 View  Result Date: 04/29/2022 CLINICAL DATA:  Questionable sepsis - evaluate for abnormality EXAM: PORTABLE CHEST 1 VIEW COMPARISON:  04/29/2022 FINDINGS: Cardiomegaly. No overt edema, confluent opacities or effusions. No acute bony abnormality. IMPRESSION: Cardiomegaly.  No active disease. Electronically Signed   By: Rolm Baptise M.D.   On: 04/29/2022 23:04   DG Chest Portable 1 View  Result Date: 04/29/2022 CLINICAL DATA:  Body aches and chills for several hours, initial encounter EXAM: PORTABLE CHEST 1 VIEW COMPARISON:  03/30/2021  FINDINGS: Cardiac shadow is enlarged but stable. Aortic calcifications are seen. Lungs are well aerated bilaterally without focal infiltrate or effusion. Mild increased bronchitic markings are noted. Degenerative changes of the shoulder joints are seen. IMPRESSION: Mild increased bronchitic markings are noted without focal confluent infiltrate. No other focal abnormality is noted. Electronically Signed   By: Inez Catalina M.D.   On: 04/29/2022 00:29      Subjective: No new complaints  Discharge Exam: Vitals:   05/03/22 1430 05/03/22 1500 05/03/22 1527 05/03/22 1545  BP: 113/72 125/74 122/72 115/70  Pulse: 67 69 66 69  Resp:    18  Temp:    (!) 97.2 F (36.2 C)  TempSrc:    Oral  SpO2:    100%  Weight:    92.5 kg  Height:        General: Pt is alert, awake, not in acute distress Cardiovascular: RRR, S1/S2 +, no rubs, no gallops Respiratory: CTA bilaterally, no wheezing, no rhonchi Abdominal: Soft, NT, ND, bowel sounds + Extremities: Bilateral lower extremities in dressings    The results of significant diagnostics from this hospitalization (including imaging, microbiology, ancillary and laboratory) are listed below for reference.     Microbiology: Recent Results (from the past 240 hour(s))  Blood culture (routine x 2)     Status: Abnormal   Collection Time: 04/28/22 11:31 PM   Specimen: BLOOD  Result Value Ref Range Status   Specimen Description   Final    BLOOD RIGHT ANTECUBITAL Performed at [facility], [address], [place], [place] [address]    Special Requests   Final    BOTTLES DRAWN AEROBIC AND ANAEROBIC Blood Culture adequate volume Performed at [name], [address] [place], [place] [address]  Culture  Setup Time   Final    GRAM NEGATIVE RODS ANAEROBIC BOTTLE ONLY Gram Stain Report Called to,Read Back By and Verified With: MARVA FIMMS AT 15:29 ON 04/29/22 BY SA CRITICAL RESULT CALLED TO, READ BACK BY AND VERIFIED WITH: RN ANGIE COE ON 04/29/22 @  2145 BY DRT Performed at [facility], [place] [address], [place], [place] [address]    Culture CITROBACTER KOSERI (A)  Final   Report Status 05/01/2022 FINAL  Final   Organism ID, Bacteria CITROBACTER KOSERI  Final      Susceptibility   Citrobacter koseri - MIC*    CEFAZOLIN <=4 SENSITIVE Sensitive     CEFEPIME <=0.12 SENSITIVE Sensitive     CEFTAZIDIME <=1 SENSITIVE Sensitive     CEFTRIAXONE <=0.25 SENSITIVE Sensitive     CIPROFLOXACIN <=0.25 SENSITIVE Sensitive     GENTAMICIN <=1 SENSITIVE Sensitive     IMIPENEM <=0.25 SENSITIVE Sensitive     TRIMETH/SULFA <=20 SENSITIVE Sensitive     PIP/TAZO 16 SENSITIVE Sensitive     * CITROBACTER KOSERI  Blood Culture ID Panel (Reflexed)     Status: Abnormal   Collection Time: 04/28/22 11:31 PM  Result Value Ref Range Status   Enterococcus faecalis NOT DETECTED NOT DETECTED Final   Enterococcus Faecium NOT DETECTED NOT DETECTED Final   Listeria monocytogenes NOT DETECTED NOT DETECTED Final   Staphylococcus species NOT DETECTED NOT DETECTED Final   Staphylococcus aureus (BCID) NOT DETECTED NOT DETECTED Final   Staphylococcus epidermidis NOT DETECTED NOT DETECTED Final   Staphylococcus lugdunensis NOT DETECTED NOT DETECTED Final   Streptococcus species NOT DETECTED NOT DETECTED Final   Streptococcus agalactiae NOT DETECTED NOT DETECTED Final   Streptococcus pneumoniae NOT DETECTED NOT DETECTED Final   Streptococcus pyogenes NOT DETECTED NOT DETECTED Final   A.calcoaceticus-baumannii NOT DETECTED NOT DETECTED Final   Bacteroides fragilis NOT DETECTED NOT DETECTED Final   Enterobacterales DETECTED (A) NOT DETECTED Final    Comment: Enterobacterales represent a large order of gram negative bacteria, not a single organism. Refer to culture for further identification. CRITICAL RESULT CALLED TO, READ BACK BY AND VERIFIED WITH: RN ANGIE COE ON 04/29/22 @ 2145 BY DRT    Enterobacter cloacae complex NOT DETECTED NOT DETECTED Final   Escherichia  coli NOT DETECTED NOT DETECTED Final   Klebsiella aerogenes NOT DETECTED NOT DETECTED Final   Klebsiella oxytoca NOT DETECTED NOT DETECTED Final   Klebsiella pneumoniae NOT DETECTED NOT DETECTED Final   Proteus species NOT DETECTED NOT DETECTED Final   Salmonella species NOT DETECTED NOT DETECTED Final   Serratia marcescens NOT DETECTED NOT DETECTED Final   Haemophilus influenzae NOT DETECTED NOT DETECTED Final   Neisseria meningitidis NOT DETECTED NOT DETECTED Final   Pseudomonas aeruginosa NOT DETECTED NOT DETECTED Final   Stenotrophomonas maltophilia NOT DETECTED NOT DETECTED Final   Candida albicans NOT DETECTED NOT DETECTED Final   Candida auris NOT DETECTED NOT DETECTED Final   Candida glabrata NOT DETECTED NOT DETECTED Final   Candida krusei NOT DETECTED NOT DETECTED Final   Candida parapsilosis NOT DETECTED NOT DETECTED Final   Candida tropicalis NOT DETECTED NOT DETECTED Final   Cryptococcus neoformans/gattii NOT DETECTED NOT DETECTED Final   CTX-M ESBL NOT DETECTED NOT DETECTED Final   Carbapenem resistance IMP NOT DETECTED NOT DETECTED Final   Carbapenem resistance KPC NOT DETECTED NOT DETECTED Final   Carbapenem resistance NDM NOT DETECTED NOT DETECTED Final   Carbapenem resist OXA 48 LIKE NOT DETECTED NOT DETECTED Final  Carbapenem resistance VIM NOT DETECTED NOT DETECTED Final    Comment: Performed at [facility], [place] [address] [place], [place] [address]  SARS Coronavirus 2 by RT PCR (hospital order, performed in [facility] hospital lab) *cepheid single result test* Anterior Nasal Swab     Status: None   Collection Time: 04/29/22  1:14 AM   Specimen: Anterior Nasal Swab  Result Value Ref Range Status   SARS Coronavirus 2 by RT PCR NEGATIVE NEGATIVE Final    Comment: (NOTE) SARS-CoV-2 target nucleic acids are NOT DETECTED.  The SARS-CoV-2 RNA is generally detectable in upper and lower respiratory specimens during the acute phase of infection. The  lowest concentration of SARS-CoV-2 viral copies this assay can detect is 250 copies / mL. A negative result does not preclude SARS-CoV-2 infection and should not be used as the sole basis for treatment or other patient management decisions.  A negative result may occur with improper specimen collection / handling, submission of specimen other than nasopharyngeal swab, presence of viral mutation(s) within the areas targeted by this assay, and inadequate number of viral copies (<250 copies / mL). A negative result must be combined with clinical observations, patient history, and epidemiological information.  Fact Sheet for Patients:   https://www.patel.info/  Fact Sheet for Healthcare Providers: https://hall.com/  This test is not yet approved or  cleared by the [place] FDA and has been authorized for detection and/or diagnosis of SARS-CoV-2 by FDA under an Emergency Use Authorization (EUA).  This EUA will remain in effect (meaning this test can be used) for the duration of the COVID-19 declaration under Section 564(b)(1) of the Act, 21 U.S.C. section 360bbb-3(b)(1), unless the authorization is terminated or revoked sooner.  Performed at [facility], [address] [place], [place] [address]   Resp Panel by RT-PCR (Flu A&B, Covid) Anterior Nasal Swab     Status: None   Collection Time: 04/29/22 10:02 PM   Specimen: Anterior Nasal Swab  Result Value Ref Range Status   SARS Coronavirus 2 by RT PCR NEGATIVE NEGATIVE Final    Comment: (NOTE) SARS-CoV-2 target nucleic acids are NOT DETECTED.  The SARS-CoV-2 RNA is generally detectable in upper respiratory specimens during the acute phase of infection. The lowest concentration of SARS-CoV-2 viral copies this assay can detect is 138 copies/mL. A negative result does not preclude SARS-Cov-2 infection and should not be used as the sole basis for treatment or other patient management  decisions. A negative result may occur with  improper specimen collection/handling, submission of specimen other than nasopharyngeal swab, presence of viral mutation(s) within the areas targeted by this assay, and inadequate number of viral copies(<138 copies/mL). A negative result must be combined with clinical observations, patient history, and epidemiological information. The expected result is Negative.  Fact Sheet for Patients:  EntrepreneurPulse.com.au  Fact Sheet for Healthcare Providers:  IncredibleEmployment.be  This test is no t yet approved or cleared by the [place] FDA and  has been authorized for detection and/or diagnosis of SARS-CoV-2 by FDA under an Emergency Use Authorization (EUA). This EUA will remain  in effect (meaning this test can be used) for the duration of the COVID-19 declaration under Section 564(b)(1) of the Act, 21 U.S.C.section 360bbb-3(b)(1), unless the authorization is terminated  or revoked sooner.       Influenza A by PCR NEGATIVE NEGATIVE Final   Influenza B by PCR NEGATIVE NEGATIVE Final    Comment: (NOTE) The Xpert Xpress SARS-CoV-2/FLU/RSV plus assay is intended  as an aid in the diagnosis of influenza from Nasopharyngeal swab specimens and should not be used as a sole basis for treatment. Nasal washings and aspirates are unacceptable for Xpert Xpress SARS-CoV-2/FLU/RSV testing.  Fact Sheet for Patients: EntrepreneurPulse.com.au  Fact Sheet for Healthcare Providers: IncredibleEmployment.be  This test is not yet approved or cleared by the [place] FDA and has been authorized for detection and/or diagnosis of SARS-CoV-2 by FDA under an Emergency Use Authorization (EUA). This EUA will remain in effect (meaning this test can be used) for the duration of the COVID-19 declaration under Section 564(b)(1) of the Act, 21 U.S.C. section 360bbb-3(b)(1), unless the  authorization is terminated or revoked.  Performed at [facility], [address], [place], [place] [address]   Blood Culture (routine x 2)     Status: None (Preliminary result)   Collection Time: 04/29/22 10:24 PM   Specimen: Right Antecubital; Blood  Result Value Ref Range Status   Specimen Description   Final    RIGHT ANTECUBITAL BOTTLES DRAWN AEROBIC AND ANAEROBIC   Special Requests   Final    Blood Culture results may not be optimal due to an excessive volume of blood received in culture bottles   Culture   Final    NO GROWTH 3 DAYS Performed at [facility], [address], [place], [place] [address]    Report Status PENDING  Incomplete  Blood culture (routine x 2)     Status: None (Preliminary result)   Collection Time: 04/29/22 10:34 PM   Specimen: Right Antecubital; Blood  Result Value Ref Range Status   Specimen Description RIGHT ANTECUBITAL  Final   Special Requests   Final    BOTTLES DRAWN AEROBIC AND ANAEROBIC Blood Culture results may not be optimal due to an excessive volume of blood received in culture bottles   Culture   Final    NO GROWTH 4 DAYS Performed at [facility], [address] [place], [place] [address]    Report Status PENDING  Incomplete  Blood Culture (routine x 2)     Status: Abnormal   Collection Time: 04/29/22 10:41 PM   Specimen: Right Antecubital; Blood  Result Value Ref Range Status   Specimen Description   Final    RIGHT ANTECUBITAL BOTTLES DRAWN AEROBIC AND ANAEROBIC Performed at [name], [address] [place], [place] [address]    Special Requests   Final    Blood Culture adequate volume Performed at [facility], [address], [place], [place] [address]    Culture  Setup Time   Final    GRAM NEGATIVE RODS AEROBIC BOTTLE ONLY Gram Stain Report Called to,Read Back By and Verified With: PREVIOUSLY CALLED TO MARVA FIMMS 1529 04/29/22. WORK DONE AT [facility]. CRITICAL VALUE NOTED.  VALUE IS CONSISTENT WITH PREVIOUSLY REPORTED AND CALLED  VALUE.    Culture (A)  Final    CITROBACTER KOSERI SUSCEPTIBILITIES PERFORMED ON PREVIOUS CULTURE WITHIN THE LAST 5 DAYS. Performed at [facility], [place] [address] [place], [place] [address]    Report Status 05/02/2022 FINAL  Final     Labs: BNP (last 3 results) No results for input(s): "BNP" in the last 8760 hours. Basic Metabolic Panel: Recent Labs  Lab 04/28/22 2331 04/29/22 2240 04/30/22 0631 05/02/22 0435 05/03/22 0436  NA 137 134* 134* 134* 135  K 5.0 4.8 5.1 4.6 5.0  CL 100 98 100 101 103  CO2 _0 GLUCOSE 243* 146* 180* 174* 180*  BUN 35* 43* 46* 44* 55*  CREATININE 5.12* 6.20* 6.54* 6.27* 7.16*  CALCIUM 9.0 9.2 8.8* 8.7* 8.7*  MG  --   --  2.1  --   --   PHOS  --   --  5.3* 5.5* 5.9*   Liver Function Tests: Recent Labs  Lab 04/28/22 2331 04/29/22 2240 04/30/22 0631 05/02/22 0435 05/03/22 0436  AST 14* 15 14*  --   --   ALT _0 --   --   ALKPHOS 121 114 108  --   --   BILITOT 0.8 1.2 1.1  --   --   PROT 7.0 7.0 6.5  --   --   ALBUMIN 3.7 3.5 3.3* 3.1* 2.9*   No results for input(s): "LIPASE", "AMYLASE" in the last 168 hours. No results for input(s): "AMMONIA" in the last 168 hours. CBC: Recent Labs  Lab 04/28/22 2331 04/29/22 2240 04/30/22 0631 05/03/22 0436  WBC 13.1* 9.0 8.9 4.9  NEUTROABS 11.9* 7.5  --   --   HGB 11.6* 11.2* 10.5* 10.5*  HCT 38.2 36.5 34.9* 34.7*  MCV 95.0 94.6 95.6 94.8  PLT 154 157 145* 146*   Cardiac Enzymes: No results for input(s): "CKTOTAL", "CKMB", "CKMBINDEX", "TROPONINI" in the last 168 hours. BNP: Invalid input(s): "POCBNP" CBG: Recent Labs  Lab 05/01/22 2059 05/02/22 0720 05/02/22 1115 05/02/22 1622 05/02/22 2101  GLUCAP 220* 153* 213* 179* 256*   D-Dimer No results for input(s): "DDIMER" in the last 72 hours. Hgb A1c No results for input(s): "HGBA1C" in the last 72 hours. Lipid Profile No results for input(s): "CHOL", "HDL", "LDLCALC", "TRIG", "CHOLHDL", "LDLDIRECT" in the  last 72 hours. Thyroid function studies No results for input(s): "TSH", "T4TOTAL", "T3FREE", "THYROIDAB" in the last 72 hours.  Invalid input(s): "FREET3" Anemia work up No results for input(s): "VITAMINB12", "FOLATE", "FERRITIN", "TIBC", "IRON", "RETICCTPCT" in the last 72 hours. Urinalysis No results found for: "COLORURINE", "APPEARANCEUR", "LABSPEC", "PHURINE", "GLUCOSEU", "HGBUR", "BILIRUBINUR", "KETONESUR", "PROTEINUR", "UROBILINOGEN", "NITRITE", "LEUKOCYTESUR" Sepsis Labs Recent Labs  Lab 04/28/22 2331 04/29/22 2240 04/30/22 0631 05/03/22 0436  WBC 13.1* 9.0 8.9 4.9   Microbiology Recent Results (from the past 240 hour(s))  Blood culture (routine x 2)     Status: Abnormal   Collection Time: 04/28/22 11:31 PM   Specimen: BLOOD  Result Value Ref Range Status   Specimen Description   Final    BLOOD RIGHT ANTECUBITAL Performed at [facility], [place] [address] [place], [place] [address]    Special Requests   Final    BOTTLES DRAWN AEROBIC AND ANAEROBIC Blood Culture adequate volume Performed at [name], [address] [place], [place] [address]    Culture  Setup Time   Final    GRAM NEGATIVE RODS ANAEROBIC BOTTLE ONLY Gram Stain Report Called to,Read Back By and Verified With: MARVA FIMMS AT 15:29 ON 04/29/22 BY SA CRITICAL RESULT CALLED TO, READ BACK BY AND VERIFIED WITH: RN ANGIE COE ON 04/29/22 @ 2145 BY DRT Performed at [facility], [place] [address] [place], [place] [address]    Culture CITROBACTER KOSERI (A)  Final   Report Status 05/01/2022 FINAL  Final   Organism ID, Bacteria CITROBACTER KOSERI  Final      Susceptibility   Citrobacter koseri - MIC*    CEFAZOLIN <=4 SENSITIVE Sensitive     CEFEPIME <=0.12 SENSITIVE Sensitive     CEFTAZIDIME <=1 SENSITIVE Sensitive     CEFTRIAXONE <=0.25 SENSITIVE Sensitive  CIPROFLOXACIN <=0.25 SENSITIVE Sensitive     GENTAMICIN <=1 SENSITIVE Sensitive     IMIPENEM <=0.25 SENSITIVE Sensitive     TRIMETH/SULFA  <=20 SENSITIVE Sensitive     PIP/TAZO 16 SENSITIVE Sensitive     * CITROBACTER KOSERI  Blood Culture ID Panel (Reflexed)     Status: Abnormal   Collection Time: 04/28/22 11:31 PM  Result Value Ref Range Status   Enterococcus faecalis NOT DETECTED NOT DETECTED Final   Enterococcus Faecium NOT DETECTED NOT DETECTED Final   Listeria monocytogenes NOT DETECTED NOT DETECTED Final   Staphylococcus species NOT DETECTED NOT DETECTED Final   Staphylococcus aureus (BCID) NOT DETECTED NOT DETECTED Final   Staphylococcus epidermidis NOT DETECTED NOT DETECTED Final   Staphylococcus lugdunensis NOT DETECTED NOT DETECTED Final   Streptococcus species NOT DETECTED NOT DETECTED Final   Streptococcus agalactiae NOT DETECTED NOT DETECTED Final   Streptococcus pneumoniae NOT DETECTED NOT DETECTED Final   Streptococcus pyogenes NOT DETECTED NOT DETECTED Final   A.calcoaceticus-baumannii NOT DETECTED NOT DETECTED Final   Bacteroides fragilis NOT DETECTED NOT DETECTED Final   Enterobacterales DETECTED (A) NOT DETECTED Final    Comment: Enterobacterales represent a large order of gram negative bacteria, not a single organism. Refer to culture for further identification. CRITICAL RESULT CALLED TO, READ BACK BY AND VERIFIED WITH: RN ANGIE COE ON 04/29/22 @ 2145 BY DRT    Enterobacter cloacae complex NOT DETECTED NOT DETECTED Final   Escherichia coli NOT DETECTED NOT DETECTED Final   Klebsiella aerogenes NOT DETECTED NOT DETECTED Final   Klebsiella oxytoca NOT DETECTED NOT DETECTED Final   Klebsiella pneumoniae NOT DETECTED NOT DETECTED Final   Proteus species NOT DETECTED NOT DETECTED Final   Salmonella species NOT DETECTED NOT DETECTED Final   Serratia marcescens NOT DETECTED NOT DETECTED Final   Haemophilus influenzae NOT DETECTED NOT DETECTED Final   Neisseria meningitidis NOT DETECTED NOT DETECTED Final   Pseudomonas aeruginosa NOT DETECTED NOT DETECTED Final   Stenotrophomonas maltophilia NOT DETECTED  NOT DETECTED Final   Candida albicans NOT DETECTED NOT DETECTED Final   Candida auris NOT DETECTED NOT DETECTED Final   Candida glabrata NOT DETECTED NOT DETECTED Final   Candida krusei NOT DETECTED NOT DETECTED Final   Candida parapsilosis NOT DETECTED NOT DETECTED Final   Candida tropicalis NOT DETECTED NOT DETECTED Final   Cryptococcus neoformans/gattii NOT DETECTED NOT DETECTED Final   CTX-M ESBL NOT DETECTED NOT DETECTED Final   Carbapenem resistance IMP NOT DETECTED NOT DETECTED Final   Carbapenem resistance KPC NOT DETECTED NOT DETECTED Final   Carbapenem resistance NDM NOT DETECTED NOT DETECTED Final   Carbapenem resist OXA 48 LIKE NOT DETECTED NOT DETECTED Final   Carbapenem resistance VIM NOT DETECTED NOT DETECTED Final    Comment: Performed at [facility], [address] [place], [place] [address]  SARS Coronavirus 2 by RT PCR (hospital order, performed in [name] hospital lab) *cepheid single result test* Anterior Nasal Swab     Status: None   Collection Time: 04/29/22  1:14 AM   Specimen: Anterior Nasal Swab  Result Value Ref Range Status   SARS Coronavirus 2 by RT PCR NEGATIVE NEGATIVE Final    Comment: (NOTE) SARS-CoV-2 target nucleic acids are NOT DETECTED.  The SARS-CoV-2 RNA is generally detectable in upper and lower respiratory specimens during the acute phase of infection. The lowest concentration of SARS-CoV-2 viral copies this assay can detect is 250 copies / mL. A negative result does not preclude  SARS-CoV-2 infection and should not be used as the sole basis for treatment or other patient management decisions.  A negative result may occur with improper specimen collection / handling, submission of specimen other than nasopharyngeal swab, presence of viral mutation(s) within the areas targeted by this assay, and inadequate number of viral copies (<250 copies / mL). A negative result must be combined with clinical observations, patient history, and  epidemiological information.  Fact Sheet for Patients:   https://www.patel.info/  Fact Sheet for Healthcare Providers: https://hall.com/  This test is not yet approved or  cleared by the [place] FDA and has been authorized for detection and/or diagnosis of SARS-CoV-2 by FDA under an Emergency Use Authorization (EUA).  This EUA will remain in effect (meaning this test can be used) for the duration of the COVID-19 declaration under Section 564(b)(1) of the Act, 21 U.S.C. section 360bbb-3(b)(1), unless the authorization is terminated or revoked sooner.  Performed at [name], [address], [place], [place] [address]   Resp Panel by RT-PCR (Flu A&B, Covid) Anterior Nasal Swab     Status: None   Collection Time: 04/29/22 10:02 PM   Specimen: Anterior Nasal Swab  Result Value Ref Range Status   SARS Coronavirus 2 by RT PCR NEGATIVE NEGATIVE Final    Comment: (NOTE) SARS-CoV-2 target nucleic acids are NOT DETECTED.  The SARS-CoV-2 RNA is generally detectable in upper respiratory specimens during the acute phase of infection. The lowest concentration of SARS-CoV-2 viral copies this assay can detect is 138 copies/mL. A negative result does not preclude SARS-Cov-2 infection and should not be used as the sole basis for treatment or other patient management decisions. A negative result may occur with  improper specimen collection/handling, submission of specimen other than nasopharyngeal swab, presence of viral mutation(s) within the areas targeted by this assay, and inadequate number of viral copies(<138 copies/mL). A negative result must be combined with clinical observations, patient history, and epidemiological information. The expected result is Negative.  Fact Sheet for Patients:  EntrepreneurPulse.com.au  Fact Sheet for Healthcare Providers:  IncredibleEmployment.be  This test is no t yet  approved or cleared by the [place] FDA and  has been authorized for detection and/or diagnosis of SARS-CoV-2 by FDA under an Emergency Use Authorization (EUA). This EUA will remain  in effect (meaning this test can be used) for the duration of the COVID-19 declaration under Section 564(b)(1) of the Act, 21 U.S.C.section 360bbb-3(b)(1), unless the authorization is terminated  or revoked sooner.       Influenza A by PCR NEGATIVE NEGATIVE Final   Influenza B by PCR NEGATIVE NEGATIVE Final    Comment: (NOTE) The Xpert Xpress SARS-CoV-2/FLU/RSV plus assay is intended as an aid in the diagnosis of influenza from Nasopharyngeal swab specimens and should not be used as a sole basis for treatment. Nasal washings and aspirates are unacceptable for Xpert Xpress SARS-CoV-2/FLU/RSV testing.  Fact Sheet for Patients: EntrepreneurPulse.com.au  Fact Sheet for Healthcare Providers: IncredibleEmployment.be  This test is not yet approved or cleared by the [place] FDA and has been authorized for detection and/or diagnosis of SARS-CoV-2 by FDA under an Emergency Use Authorization (EUA). This EUA will remain in effect (meaning this test can be used) for the duration of the COVID-19 declaration under Section 564(b)(1) of the Act, 21 U.S.C. section 360bbb-3(b)(1), unless the authorization is terminated or revoked.  Performed at [facility], [address], [place], [place] [address]   Blood Culture (routine x 2)  Status: None (Preliminary result)   Collection Time: 04/29/22 10:24 PM   Specimen: Right Antecubital; Blood  Result Value Ref Range Status   Specimen Description   Final    RIGHT ANTECUBITAL BOTTLES DRAWN AEROBIC AND ANAEROBIC   Special Requests   Final    Blood Culture results may not be optimal due to an excessive volume of blood received in culture bottles   Culture   Final    NO GROWTH 3 DAYS Performed at [facility], [address], [place], [place] [address]    Report Status PENDING  Incomplete  Blood culture (routine x 2)     Status: None (Preliminary result)   Collection Time: 04/29/22 10:34 PM   Specimen: Right Antecubital; Blood  Result Value Ref Range Status   Specimen Description RIGHT ANTECUBITAL  Final   Special Requests   Final    BOTTLES DRAWN AEROBIC AND ANAEROBIC Blood Culture results may not be optimal due to an excessive volume of blood received in culture bottles   Culture   Final    NO GROWTH 4 DAYS Performed at [facility], [address], [place], [place] [address]    Report Status PENDING  Incomplete  Blood Culture (routine x 2)     Status: Abnormal   Collection Time: 04/29/22 10:41 PM   Specimen: Right Antecubital; Blood  Result Value Ref Range Status   Specimen Description   Final    RIGHT ANTECUBITAL BOTTLES DRAWN AEROBIC AND ANAEROBIC Performed at [facility], [address] [place], [place] [address]    Special Requests   Final    Blood Culture adequate volume Performed at [facility], [address], [place], [place] [address]    Culture  Setup Time   Final    GRAM NEGATIVE RODS AEROBIC BOTTLE ONLY Gram Stain Report Called to,Read Back By and Verified With: PREVIOUSLY CALLED TO MARVA FIMMS 1529 04/29/22. WORK DONE AT [facility]. CRITICAL VALUE NOTED.  VALUE IS CONSISTENT WITH PREVIOUSLY REPORTED AND CALLED VALUE.    Culture (A)  Final    CITROBACTER KOSERI SUSCEPTIBILITIES PERFORMED ON PREVIOUS CULTURE WITHIN THE LAST 5 DAYS. Performed at [facility], [place] [address] [place], [place] [address]    Report Status 05/02/2022 FINAL  Final     Time coordinating discharge: 69mns  SIGNED:   JKathie Dike MD  [facility] 05/03/2022, 9:45 PM   If 7PM-7AM, please contact night-coverage [email or web address]

## 2022-05-03 NOTE — Progress Notes (Signed)
Patient ID: Amy Moses, female   DOB: 02-Dec-1970, 51 y.o.   MRN: [id number] S: Feels well, no complaints. O:BP 104/63 (BP Location: Right Arm)   Pulse 70   Temp 97.9 F (36.6 C) (Oral)   Resp 19   Ht 5\' 6"  (1.676 m)   Wt 95 kg   LMP 08/22/2015 (Approximate)   SpO2 99%   BMI 33.80 kg/m   Intake/Output Summary (Last 24 hours) at 05/03/2022 0856 Last data filed at 05/02/2022 1831 Gross per 24 hour  Intake 840 ml  Output --  Net 840 ml   Intake/Output: I/O last 3 completed shifts: In: 1169.4 [P.O.:1080; IV Piggyback:89.4] Out: 1 [Stool:1]  Intake/Output this shift:  No intake/output data recorded. Weight change:  Gen:NAD CVS: RRR Resp:CTA Abd: +BS, soft, NT/ND Ext: 2+ presacral edema  Recent Labs  Lab 04/28/22 2331 04/29/22 2240 04/30/22 0631 05/02/22 0435 05/03/22 0436  NA 137 134* 134* 134* 135  K 5.0 4.8 5.1 4.6 5.0  CL 100 98 100 101 103  CO2 26 26 23 24 22   GLUCOSE 243* 146* 180* 174* 180*  BUN 35* 43* 46* 44* 55*  CREATININE 5.12* 6.20* 6.54* 6.27* 7.16*  ALBUMIN 3.7 3.5 3.3* 3.1* 2.9*  CALCIUM 9.0 9.2 8.8* 8.7* 8.7*  PHOS  --   --  5.3* 5.5* 5.9*  AST 14* 15 14*  --   --   ALT 13 15 13   --   --    Liver Function Tests: Recent Labs  Lab 04/28/22 2331 04/29/22 2240 04/30/22 0631 05/02/22 0435 05/03/22 0436  AST 14* 15 14*  --   --   ALT 13 15 13   --   --   ALKPHOS 121 114 108  --   --   BILITOT 0.8 1.2 1.1  --   --   PROT 7.0 7.0 6.5  --   --   ALBUMIN 3.7 3.5 3.3* 3.1* 2.9*   No results for input(s): "LIPASE", "AMYLASE" in the last 168 hours. No results for input(s): "AMMONIA" in the last 168 hours. CBC: Recent Labs  Lab 04/28/22 2331 04/29/22 2240 04/30/22 0631 05/03/22 0436  WBC 13.1* 9.0 8.9 4.9  NEUTROABS 11.9* 7.5  --   --   HGB 11.6* 11.2* 10.5* 10.5*  HCT 38.2 36.5 34.9* 34.7*  MCV 95.0 94.6 95.6 94.8  PLT 154 157 145* 146*   Cardiac Enzymes: No results for input(s): "CKTOTAL", "CKMB", "CKMBINDEX", "TROPONINI" in the  last 168 hours. CBG: Recent Labs  Lab 05/01/22 2059 05/02/22 0720 05/02/22 1115 05/02/22 1622 05/02/22 2101  GLUCAP 220* 153* 213* 179* 256*    Iron Studies: No results for input(s): "IRON", "TIBC", "TRANSFERRIN", "FERRITIN" in the last 72 hours. Studies/Results: ECHOCARDIOGRAM COMPLETE  Result Date: 05/02/2022    ECHOCARDIOGRAM REPORT   Patient Name:   Amy Moses Date of Exam: 05/02/2022 Medical Rec #:  [id number]       Height:       66.0 in Accession #:    [id number]      Weight:       209.4 lb Date of Birth:  Apr 12, 1971      BSA:          2.039 m Patient Age:    51 years        BP:           100/65 mmHg Patient Gender: F               HR:  72 bpm. Exam Location:  [facility] Procedure: 2D Echo, Cardiac Doppler and Color Doppler Indications:    Bacteremia  History:        Patient has prior history of Echocardiogram examinations, most                 recent 04/01/2021. CHF, Signs/Symptoms:Bacteremia; Risk                 Factors:Hypertension, Diabetes and Dyslipidemia.  Sonographer:    Wenda Low Referring Phys: [place]  1. Left ventricular ejection fraction, by estimation, is 45 to 50%. Left ventricular ejection fraction by PLAX is 46 %. The left ventricle has mildly decreased function. The left ventricle demonstrates global hypokinesis. Left ventricular diastolic parameters are consistent with Grade II diastolic dysfunction (pseudonormalization). Elevated left ventricular end-diastolic pressure.  2. Right ventricular systolic function is mildly reduced. The right ventricular size is normal. There is moderately elevated pulmonary artery systolic pressure. The estimated right ventricular systolic pressure is 41.2 mmHg.  3. Left atrial size was severely dilated.  4. Right atrial size was mildly dilated.  5. The pericardial effusion is circumferential. There is no evidence of cardiac tamponade.  6. The mitral valve is abnormal. Mild mitral valve regurgitation.   7. Tricuspid valve regurgitation is mild to moderate.  8. The aortic valve is tricuspid. Aortic valve regurgitation is not visualized. Aortic valve sclerosis/calcification is present, without any evidence of aortic stenosis. Aortic valve mean gradient measures 5.0 mmHg.  9. The inferior vena cava is dilated in size with >50% respiratory variability, suggesting right atrial pressure of 8 mmHg. Comparison(s): Changes from prior study are noted. 04/01/2021: LVEF 55-60%, grade 2 DD. FINDINGS  Left Ventricle: Left ventricular ejection fraction, by estimation, is 45 to 50%. Left ventricular ejection fraction by PLAX is 46 %. The left ventricle has mildly decreased function. The left ventricle demonstrates global hypokinesis. The left ventricular internal cavity size was normal in size. There is no left ventricular hypertrophy. Left ventricular diastolic parameters are consistent with Grade II diastolic dysfunction (pseudonormalization). Elevated left ventricular end-diastolic pressure. Right Ventricle: The right ventricular size is normal. No increase in right ventricular wall thickness. Right ventricular systolic function is mildly reduced. There is moderately elevated pulmonary artery systolic pressure. The tricuspid regurgitant velocity is 3.39 m/s, and with an assumed right atrial pressure of 8 mmHg, the estimated right ventricular systolic pressure is 87.8 mmHg. Left Atrium: Left atrial size was severely dilated. Right Atrium: Right atrial size was mildly dilated. Pericardium: Trivial pericardial effusion is present. The pericardial effusion is circumferential. There is no evidence of cardiac tamponade. Mitral Valve: The mitral valve is abnormal. Mild mitral annular calcification. Mild mitral valve regurgitation. MV peak gradient, 5.3 mmHg. The mean mitral valve gradient is 2.0 mmHg. Tricuspid Valve: The tricuspid valve is grossly normal. Tricuspid valve regurgitation is mild to moderate. Aortic Valve: The aortic  valve is tricuspid. Aortic valve regurgitation is not visualized. Aortic valve sclerosis/calcification is present, without any evidence of aortic stenosis. Aortic valve mean gradient measures 5.0 mmHg. Aortic valve peak gradient measures 9.6 mmHg. Aortic valve area, by VTI measures 1.96 cm. Pulmonic Valve: The pulmonic valve was normal in structure. Pulmonic valve regurgitation is not visualized. Aorta: The aortic root and ascending aorta are structurally normal, with no evidence of dilitation. Venous: The inferior vena cava is dilated in size with greater than 50% respiratory variability, suggesting right atrial pressure of 8 mmHg. IAS/Shunts: No atrial level shunt detected by color flow  Doppler.  LEFT VENTRICLE PLAX 2D LV EF:         Left            Diastology                ventricular     LV e' medial:    3.81 cm/s                ejection        LV E/e' medial:  27.6                fraction by     LV e' lateral:   7.40 cm/s                PLAX is 46      LV E/e' lateral: 14.2                %. LVIDd:         4.80 cm LVIDs:         3.70 cm LV PW:         1.00 cm LV IVS:        0.90 cm LVOT diam:     1.80 cm LV SV:         68 LV SV Index:   33 LVOT Area:     2.54 cm  LV Volumes (MOD) LV vol d, MOD    73.2 ml A2C: LV vol d, MOD    84.7 ml A4C: LV vol s, MOD    41.5 ml A2C: LV vol s, MOD    44.0 ml A4C: LV SV MOD A2C:   31.7 ml LV SV MOD A4C:   84.7 ml LV SV MOD BP:    34.8 ml RIGHT VENTRICLE RV Basal diam:  3.85 cm RV S prime:     9.14 cm/s TAPSE (M-mode): 2.0 cm LEFT ATRIUM           Index        RIGHT ATRIUM           Index LA diam:      4.20 cm 2.06 cm/m   RA Area:     20.90 cm LA Vol (A2C): 61.3 ml 30.06 ml/m  RA Volume:   63.40 ml  31.09 ml/m LA Vol (A4C): 99.1 ml 48.59 ml/m  AORTIC VALVE                     PULMONIC VALVE AV Area (Vmax):    1.67 cm      PV Vmax:       0.76 m/s AV Area (Vmean):   1.76 cm      PV Peak grad:  2.3 mmHg AV Area (VTI):     1.96 cm AV Vmax:           155.00 cm/s AV Vmean:           103.000 cm/s AV VTI:            0.347 m AV Peak Grad:      9.6 mmHg AV Mean Grad:      5.0 mmHg LVOT Vmax:         102.00 cm/s LVOT Vmean:        71.100 cm/s LVOT VTI:          0.267 m LVOT/AV VTI ratio: 0.77  AORTA Ao Root diam: 2.80 cm MITRAL VALVE  TRICUSPID VALVE MV Area (PHT): 3.13 cm     TR Peak grad:   46.0 mmHg MV Area VTI:   1.86 cm     TR Vmax:        339.00 cm/s MV Peak grad:  5.3 mmHg MV Mean grad:  2.0 mmHg     SHUNTS MV Vmax:       1.15 m/s     Systemic VTI:  0.27 m MV Vmean:      68.3 cm/s    Systemic Diam: 1.80 cm MV Decel Time: 242 msec MV E velocity: 105.00 cm/s MV A velocity: 78.40 cm/s MV E/A ratio:  1.34 Lyman Bishop MD Electronically signed by Lyman Bishop MD Signature Date/Time: 05/02/2022/6:07:34 PM    Final    MR FOOT RIGHT WO CONTRAST  Result Date: 05/02/2022 CLINICAL DATA:  Soft tissue infection of the lower leg and foot. EXAM: MRI OF LOWER RIGHT EXTREMITY WITHOUT CONTRAST; MRI OF THE RIGHT FOREFOOT WITHOUT CONTRAST TECHNIQUE: Multiplanar, multisequence MR imaging of the right lower leg was performed. No intravenous contrast was administered. Multiplanar, multisequence MR imaging of the right foot was performed. No intravenous contrast was administered. COMPARISON:  None Available. FINDINGS: Bones/Joint/Cartilage Soft tissue wound overlying the lateral aspect of the cuboid with mild subcortical bone marrow edema in the distal lateral corner concerning for osteomyelitis. Orthopedic hardware in the distal fibula and tibia with resulting susceptibility artifact partially obscuring the adjacent soft tissue and osseous structures. No fracture or dislocation. Old posttraumatic deformity of the second metatarsal. Normal alignment. No joint effusion. Small amount of fluid in the first intermetatarsal bursa. Severe osteoarthritis of the first MTP joint. Moderate osteoarthritis of the first IP joint. Mild osteoarthritis of the remainder of the MTP joints. Prior fifth  metatarsal amputation. Mild osteoarthritis of the first TMT joint. Ligaments Collateral ligaments are intact.  Lisfranc ligament is intact. Muscles and Tendons Flexor, peroneal and extensor compartment tendons are intact. T2 hyperintense signal in the plantar musculature likely neurogenic. Generalized muscle atrophy of the right lower leg. Soft tissue No fluid collection or hematoma. No soft tissue mass. Soft tissue edema throughout the right lower leg and foot which may be reactive secondary to fluid overload/venous insufficiency versus cellulitis. No drainable fluid collection to suggest an abscess. IMPRESSION: 1. Soft tissue wound overlying the lateral aspect of the cuboid with mild subcortical bone marrow edema in the distal lateral corner concerning for osteomyelitis. 2. Soft tissue edema throughout the right lower leg and foot which may be reactive secondary to fluid overload/venous insufficiency versus cellulitis. No drainable fluid collection to suggest an abscess. 3. Severe osteoarthritis of the first MTP joint. Electronically Signed   By: Kathreen Devoid M.D.   On: 05/02/2022 15:12   MR TIBIA FIBULA RIGHT WO CONTRAST  Result Date: 05/02/2022 CLINICAL DATA:  Soft tissue infection of the lower leg and foot. EXAM: MRI OF LOWER RIGHT EXTREMITY WITHOUT CONTRAST; MRI OF THE RIGHT FOREFOOT WITHOUT CONTRAST TECHNIQUE: Multiplanar, multisequence MR imaging of the right lower leg was performed. No intravenous contrast was administered. Multiplanar, multisequence MR imaging of the right foot was performed. No intravenous contrast was administered. COMPARISON:  None Available. FINDINGS: Bones/Joint/Cartilage Soft tissue wound overlying the lateral aspect of the cuboid with mild subcortical bone marrow edema in the distal lateral corner concerning for osteomyelitis. Orthopedic hardware in the distal fibula and tibia with resulting susceptibility artifact partially obscuring the adjacent soft tissue and osseous  structures. No fracture or dislocation. Old posttraumatic deformity of the second  metatarsal. Normal alignment. No joint effusion. Small amount of fluid in the first intermetatarsal bursa. Severe osteoarthritis of the first MTP joint. Moderate osteoarthritis of the first IP joint. Mild osteoarthritis of the remainder of the MTP joints. Prior fifth metatarsal amputation. Mild osteoarthritis of the first TMT joint. Ligaments Collateral ligaments are intact.  Lisfranc ligament is intact. Muscles and Tendons Flexor, peroneal and extensor compartment tendons are intact. T2 hyperintense signal in the plantar musculature likely neurogenic. Generalized muscle atrophy of the right lower leg. Soft tissue No fluid collection or hematoma. No soft tissue mass. Soft tissue edema throughout the right lower leg and foot which may be reactive secondary to fluid overload/venous insufficiency versus cellulitis. No drainable fluid collection to suggest an abscess. IMPRESSION: 1. Soft tissue wound overlying the lateral aspect of the cuboid with mild subcortical bone marrow edema in the distal lateral corner concerning for osteomyelitis. 2. Soft tissue edema throughout the right lower leg and foot which may be reactive secondary to fluid overload/venous insufficiency versus cellulitis. No drainable fluid collection to suggest an abscess. 3. Severe osteoarthritis of the first MTP joint. Electronically Signed   By: Kathreen Devoid M.D.   On: 05/02/2022 15:12    atorvastatin  10 mg Oral Daily   Chlorhexidine Gluconate Cloth  6 each Topical Q0600   enoxaparin (LOVENOX) injection  30 mg Subcutaneous Q24H   ferric citrate  420 mg Oral TID WC   insulin aspart  0-5 Units Subcutaneous QHS   insulin aspart  0-6 Units Subcutaneous TID WC   insulin glargine-yfgn  10 Units Subcutaneous QHS   levothyroxine  50 mcg Oral QAC breakfast   pantoprazole  40 mg Oral BID   silver sulfADIAZINE   Topical Daily    BMET    Component Value Date/Time    NA 135 05/03/2022 0436   K 5.0 05/03/2022 0436   CL 103 05/03/2022 0436   CO2 22 05/03/2022 0436   GLUCOSE 180 (H) 05/03/2022 0436   BUN 55 (H) 05/03/2022 0436   CREATININE 7.16 (H) 05/03/2022 0436   CALCIUM 8.7 (L) 05/03/2022 0436   GFRNONAA 7 (L) 05/03/2022 0436   GFRAA 10 (L) 06/11/2020 0336   CBC    Component Value Date/Time   WBC 4.9 05/03/2022 0436   RBC 3.66 (L) 05/03/2022 0436   HGB 10.5 (L) 05/03/2022 0436   HCT 34.7 (L) 05/03/2022 0436   PLT 146 (L) 05/03/2022 0436   MCV 94.8 05/03/2022 0436   MCH 28.7 05/03/2022 0436   MCHC 30.3 05/03/2022 0436   RDW 14.6 05/03/2022 0436   LYMPHSABS 0.9 04/29/2022 2240   MONOABS 0.4 04/29/2022 2240   EOSABS 0.1 04/29/2022 2240   BASOSABS 0.0 04/29/2022 2240     Assessment/Plan: Enterobacterales bacteremia:  unclear source.  MRI of right foot and leg 05/02/22 with possible osteo.  Ortho to see as an outpatient.  ID to assist with abx and duration.  See's Dr. [name] regularly for calciphylaxis and wound care. ESRD Will continue with TTS schedule.  Plan for HD today and UF as tolerated. Anemia:stable CKD-MBD: continue with home meds Nutrition:renal diet, carb modified Hypertension: stable    Donetta Potts, MD [facility]

## 2022-05-03 NOTE — TOC Transition Note (Signed)
Transition of Care [facility]) - CM/SW Discharge Note   Patient Details  Name: Amy Moses MRN: [id number] Date of Birth: 06/17/1971  [facility] CM/SW Contact:  Shade Flood, LCSW Phone Number: 05/03/2022, 12:40 PM   Clinical Narrative:     Per MD, pt will dc later today. TOC contacted [name] to update on need for Cefazolin 2g with HD until 05/28/22. Was informed by RN at [facility] that they are able to administer that antibiotic. TOC will fax DC summary when completed. No other TOC needs identified for dc.  Final next level of care: Home/Self Care Barriers to Discharge: Barriers Resolved   Patient Goals and CMS Choice Patient states their goals for this hospitalization and ongoing recovery are:: return home CMS Medicare.gov Compare Post Acute Care list provided to:: Patient Choice offered to / list presented to : Patient  Discharge Placement                       Discharge Plan and Services                                     Social Determinants of Health (SDOH) Interventions     Readmission Risk Interventions    05/02/2022    9:34 AM 01/26/2022    2:26 PM 05/14/2021    2:46 PM  Readmission Risk Prevention Plan  Transportation Screening Complete Complete Complete  PCP or Specialist Appt within 3-5 Days  Complete   HRI or [place]  Complete   Social Work Consult for [place] Planning/Counseling  Complete   Palliative Care Screening  Not Applicable   Medication Review Press photographer) Complete Complete Complete  HRI or Home Care Consult Complete  Complete  SW Recovery Care/Counseling Consult Complete  Complete  Palliative Care Screening Not Applicable  Not [place] Not Applicable  Not Applicable

## 2022-05-03 NOTE — Telephone Encounter (Signed)
Have not heard back from [place] in regards of this message.

## 2022-05-03 NOTE — Procedures (Signed)
   HEMODIALYSIS TREATMENT NOTE:   Uneventful 4 hour heparin-free treatment completed using left upper arm AVF (15g/antegrade). Goal met: 3 liters removed without interruption in UF.  Cerfazolin given during last 30 minutes. All blood was returned and hemostasis was achieved in 15 minutes.  No changes from pre-HD assessment.  Report given to primary nurse [name].   Rockwell Alexandria, RN

## 2022-05-03 NOTE — Progress Notes (Signed)
Nsg Discharge Note  Admit Date:  04/29/2022 Discharge date: 05/03/2022   Amy Moses to be D/C'd Home per MD order.  AVS completed.  Copy for chart, and copy for patient signed, and dated. Patient/caregiver able to verbalize understanding.  Discharge Medication: Allergies as of 05/03/2022       Reactions   Ace Inhibitors Cough        Medication List     STOP taking these medications    traMADol 50 MG tablet Commonly known as: Ultram       TAKE these medications    Accu-Chek Guide Me w/Device Kit 1 Piece by Does not apply route as directed.   Accu-Chek Guide test strip Generic drug: glucose blood Use as instructed   acetaminophen 500 MG tablet Commonly known as: TYLENOL Take 1,000 mg by mouth every 6 (six) hours as needed for moderate pain.   ascorbic acid 1000 MG tablet Commonly known as: VITAMIN C Take 1 tablet (1,000 mg total) by mouth daily.   aspirin EC 81 MG tablet Take 1 tablet (81 mg total) by mouth daily with breakfast.   atorvastatin 10 MG tablet Commonly known as: LIPITOR Take 1 tablet (10 mg total) by mouth daily. What changed: when to take this   Auryxia 1 GM 210 MG(Fe) tablet Generic drug: ferric citrate Take 420 mg by mouth 2 (two) times daily with a meal.   ceFAZolin 2-4 GM/100ML-% IVPB Commonly known as: ANCEF Inject 100 mLs (2 g total) into the vein Every Tuesday,Thursday,and Saturday with dialysis. Until 05/28/2022   FreeStyle Libre 2 Reader Amy Moses As directed   [employer] 2 Sensor Misc 1 Piece by Does not apply route every 14 (fourteen) days.   HumaLOG KwikPen 100 UNIT/ML KwikPen Generic drug: insulin lispro Inject 5-11 Units into the skin 3 (three) times daily with meals. If eats 50% or more of meal.   insulin glargine 100 UNIT/ML injection Commonly known as: LANTUS Inject 0.2 mLs (20 Units total) into the skin at bedtime.   levothyroxine 50 MCG tablet Commonly known as: SYNTHROID Take 1 tablet (50 mcg total) by mouth  daily before breakfast.   pantoprazole 40 MG tablet Commonly known as: Protonix Take 1 tablet (40 mg total) by mouth 2 (two) times daily.   pentoxifylline 400 MG CR tablet Commonly known as: TRENTAL Take 1 tablet (400 mg total) by mouth 3 (three) times daily with meals.   polyethylene glycol 17 g packet Commonly known as: MIRALAX / GLYCOLAX Take 17 g by mouth daily as needed for mild constipation.   silver sulfADIAZINE 1 % cream Commonly known as: SILVADENE Apply 1 application. topically daily. Apply to affected area daily plus dry dressing   Vitamin D (Ergocalciferol) 1.25 MG (50000 UNIT) Caps capsule Commonly known as: DRISDOL Take 50,000 Units by mouth every 14 (fourteen) days.               Discharge Care Instructions  (From admission, onward)           Start     Ordered   05/03/22 0000  Discharge wound care:       Comments: Continue wound care as per Dr. [name] -continue silvadene to wounds on bilateral lower extremities, wrap left leg with compression wrap   05/03/22 1449            Discharge Assessment: Vitals:   05/03/22 1527 05/03/22 1545  BP: 122/72 115/70  Pulse: 66 69  Resp:  18  Temp:  (!) 97.2 F (  36.2 C)  SpO2:  100%   Skin clean, dry and intact without evidence of skin break down, no evidence of skin tears noted. IV catheter discontinued intact. Site without signs and symptoms of complications - no redness or edema noted at insertion site, patient denies c/o pain - only slight tenderness at site.  Dressing with slight pressure applied.  D/c Instructions-Education: Discharge instructions given to patient/family with verbalized understanding. D/c education completed with patient/family including follow up instructions, medication list, d/c activities limitations if indicated, with other d/c instructions as indicated by MD - patient able to verbalize understanding, all questions fully answered. Patient instructed to return to ED, call 911, or  call MD for any changes in condition.  Patient escorted via [place], and D/C home via private auto.  Amy Mcmurray, LPN 1/75/1025 8:52 PM

## 2022-05-04 ENCOUNTER — Ambulatory Visit: Payer: Medicare Other | Admitting: "Endocrinology

## 2022-05-04 ENCOUNTER — Telehealth: Payer: Self-pay | Admitting: Orthopedic Surgery

## 2022-05-04 LAB — CULTURE, BLOOD (ROUTINE X 2): Culture: NO GROWTH

## 2022-05-04 NOTE — Telephone Encounter (Signed)
Can you call pt and make an appt for tomorrow please? Let me know what time and I will open it up. Thank you

## 2022-05-04 NOTE — Telephone Encounter (Signed)
I called pt and advised appt sch. She is fu from the ER foot infection

## 2022-05-04 NOTE — Telephone Encounter (Signed)
Pt called requesting appt for 1 pm tomorrow.

## 2022-05-04 NOTE — Telephone Encounter (Signed)
Pt called and she has an infection in her foot and was wondering if she could get in this week?  [phone]

## 2022-05-05 ENCOUNTER — Ambulatory Visit (INDEPENDENT_AMBULATORY_CARE_PROVIDER_SITE_OTHER): Payer: Medicare Other | Admitting: Orthopedic Surgery

## 2022-05-05 DIAGNOSIS — I890 Lymphedema, not elsewhere classified: Secondary | ICD-10-CM

## 2022-05-05 DIAGNOSIS — L97911 Non-pressure chronic ulcer of unspecified part of right lower leg limited to breakdown of skin: Secondary | ICD-10-CM

## 2022-05-05 DIAGNOSIS — L97923 Non-pressure chronic ulcer of unspecified part of left lower leg with necrosis of muscle: Secondary | ICD-10-CM

## 2022-05-05 LAB — CULTURE, BLOOD (ROUTINE X 2): Culture: NO GROWTH

## 2022-05-08 ENCOUNTER — Encounter: Payer: Self-pay | Admitting: Orthopedic Surgery

## 2022-05-08 NOTE — Progress Notes (Signed)
Office Visit Note   Patient: Amy Moses           Date of Birth: 1971/07/23           MRN: [id number] Visit Date: 05/05/2022              Requested by: Emelda Fear, [place] [address] PCP: Emelda Fear, DO  Chief Complaint  Patient presents with   Right Foot - Routine Post Op    02/16/2022 right foot 5th ray amputation  ER f/u 04/28/2022      HPI: Patient is a 51 year old woman who presents status post treatment for calciphylaxis both legs and status post right foot fifth ray amputation.  Assessment & Plan: Visit Diagnoses:  1. Calciphylaxis of left lower extremity with nonhealing ulcer with necrosis of muscle (HCC)   2. Calciphylaxis of right lower extremity with nonhealing ulcer, limited to breakdown of skin ([place])   3. Lymphedema     Plan: Continue current treatment with compression on the left dry dressing on the right.  Follow-Up Instructions: Return in about 1 week (around 05/12/2022).   Ortho Exam  Patient is alert, oriented, no adenopathy, well-dressed, normal affect, normal respiratory effort. The left calf wound continues to heal.  The right calf wound is healed she has healing of the ankle ulcers bilaterally.  The lateral right foot wound continues to heal with improved granulation tissue patient did have an MRI scan that showed some edema in the bone laterally to the right foot however with the improvement of the soft tissue healing we will follow this expectantly.  Imaging: No results found. No images are attached to the encounter.  Labs: Lab Results  Component Value Date   HGBA1C 11.5 (H) 02/15/2022   HGBA1C 13.0 12/30/2021   HGBA1C 12.2 (H) 12/10/2021   ESRSEDRATE 34 (H) 02/14/2022   ESRSEDRATE 40 (H) 08/07/2021   CRP 7.0 (H) 02/14/2022   CRP 11.1 (H) 08/07/2021   REPTSTATUS 05/02/2022 FINAL 04/29/2022   GRAMSTAIN  07/08/2021    RARE WBC PRESENT,BOTH PMN AND MONONUCLEAR RARE GRAM POSITIVE RODS RARE GRAM POSITIVE  COCCI IN PAIRS    CULT (A) 04/29/2022    CITROBACTER KOSERI SUSCEPTIBILITIES PERFORMED ON PREVIOUS CULTURE WITHIN THE LAST 5 DAYS. Performed at [facility], [place] [address] [place], [place] [address]    Doy Hutching CITROBACTER KOSERI 04/28/2022     Lab Results  Component Value Date   ALBUMIN 2.9 (L) 05/03/2022   ALBUMIN 3.1 (L) 05/02/2022   ALBUMIN 3.3 (L) 04/30/2022   PREALBUMIN 13.7 (L) 08/07/2021    Lab Results  Component Value Date   MG 2.1 04/30/2022   MG 2.1 05/14/2021   MG 2.8 (H) 05/11/2021   Lab Results  Component Value Date   VD25OH 36.9 12/30/2021    Lab Results  Component Value Date   PREALBUMIN 13.7 (L) 08/07/2021      Latest Ref Rng & Units 05/03/2022    4:36 AM 04/30/2022    6:31 AM 04/29/2022   10:40 PM  CBC EXTENDED  WBC 4.0 - 10.5 K/uL 4.9  8.9  9.0   RBC 3.87 - 5.11 MIL/uL 3.66  3.65  3.86   Hemoglobin 12.0 - 15.0 g/dL 10.5  10.5  11.2   HCT 36.0 - 46.0 % 34.7  34.9  36.5   Platelets 150 - 400 K/uL 146  145  157   NEUT# 1.7 - 7.7 K/uL   7.5   Lymph# 0.7 -  4.0 K/uL   0.9      There is no height or weight on file to calculate BMI.  Orders:  No orders of the defined types were placed in this encounter.  No orders of the defined types were placed in this encounter.    Procedures: No procedures performed  Clinical Data: No additional findings.  ROS:  All other systems negative, except as noted in the HPI. Review of Systems  Objective: Vital Signs: LMP 08/22/2015 (Approximate)   Specialty Comments:  No specialty comments available.  PMFS History: Patient Active Problem List   Diagnosis Date Noted   GERD (gastroesophageal reflux disease) 04/30/2022   Bacteremia 04/29/2022   Non-adherence to medical treatment 03/16/2022   Cutaneous abscess of right foot    Osteomyelitis of foot ([place]) 02/14/2022   Obesity (BMI 30-39.9) 01/26/2022   Ischemic ulcer of right foot ([place]) 01/24/2022   Normocytic anemia 01/24/2022   Sacral  pressure ulcer 97/35/3299   Metabolic acidosis, increased anion gap 01/24/2022   Leg wound, left, sequela 12/10/2021   Open leg wound 09/03/2021   Wound infection    Non-pressure chronic ulcer of right calf limited to breakdown of skin ([place])    Calciphylaxis of right lower extremity with nonhealing ulcer, limited to breakdown of skin ([place])    Chronic ulcer of left thigh ([place]) 08/07/2021   Calciphylaxis of left lower extremity with nonhealing ulcer with necrosis of muscle ([place]) 08/07/2021   Lower GI bleed 05/12/2021   Acute GI bleeding 04/24/2021   Acute blood loss anemia 04/23/2021   GI bleed 04/22/2021   Fever 03/31/2021   Acute on chronic heart failure with preserved ejection fraction (HFpEF) ([place]) 03/30/2021   End stage renal disease ([place]) 11/16/2020   Calciphylaxis 11/06/2020   Non-healing open wound of heel 11/03/2020   Diabetic foot infection ([place]) 11/01/2020   Decubitus ulcer, heel 11/01/2020   Closed nondisplaced fracture of left patella 24/26/8341   Metabolic acidosis 96/22/2979   Acute on chronic renal failure ([place]) 06/10/2020   Acute pericardial effusion 06/10/2020   Chronic kidney disease, stage 4 (severe) ([place]) 03/05/2019   Vitamin D deficiency 01/28/2019   Type 2 diabetes mellitus with ESRD (end-stage renal disease) ([place]) 09/21/2015   Mixed hyperlipidemia 09/21/2015   Essential hypertension 09/21/2015   Acquired hypothyroidism 09/21/2015   Iris bomb 07/31/2012   Secondary angle-closure glaucoma 07/31/2012   Past Medical History:  Diagnosis Date   Anemia    Blindness of right eye with low vision in contralateral eye    s/p victrectomy   Diabetes mellitus, type II ([place])    Dyslipidemia    Glaucoma    History of blood transfusion    Hypertension    Hypothyroidism (acquired)    Kidney disease    Stage 5   Pneumonia     Family History  Problem Relation Age of Onset   Heart disease Mother    Diabetes Mother    Kidney disease Mother    Diabetes Father     Heart disease Father    Diabetes Brother    Colon cancer Neg Hx     Past Surgical History:  Procedure Laterality Date   ABDOMINAL AORTOGRAM W/LOWER EXTREMITY Bilateral 12/18/2020   Procedure: ABDOMINAL AORTOGRAM W/LOWER EXTREMITY;  Surgeon: Elam Dutch, MD;  Location: [facility];  Service: Cardiovascular;  Laterality: Bilateral;   ABDOMINAL AORTOGRAM W/LOWER EXTREMITY Bilateral 01/25/2022   Procedure: ABDOMINAL AORTOGRAM W/LOWER EXTREMITY;  Surgeon: Serafina Mitchell, MD;  Location: [facility];  Service: Cardiovascular;  Laterality: Bilateral;   AMPUTATION Right 02/16/2022   Procedure: RIGHT FOOT 5TH RAY AMPUTATION;  Surgeon: Newt Minion, MD;  Location: [facility];  Service: Orthopedics;  Laterality: Right;   ANKLE FRACTURE SURGERY Right    AV FISTULA PLACEMENT Left 08/18/2020   Procedure: LEFT ARM BRACHIOCEPHALIC ARTERIOVENOUS (AV) FISTULA CREATION;  Surgeon: Elam Dutch, MD;  Location: [facility];  Service: Vascular;  Laterality: Left;   BIOPSY  04/24/2021   Procedure: BIOPSY;  Surgeon: Eloise Harman, DO;  [facility];  Service: Endoscopy;;   CESAREAN SECTION     CHOLECYSTECTOMY     COLONOSCOPY  04/24/2021   Surgeon: Eloise Harman, DO;  nonbleeding internal hemorrhoids, 1 large (25 mm) pedunculated transverse colon polyp (prolapse type polyp) with adherent clot and stigmata of recent bleed.   COLONOSCOPY WITH PROPOFOL N/A 05/14/2021   Procedure: COLONOSCOPY WITH PROPOFOL;  Surgeon: Daneil Dolin, MD;  [facility];  Service: Endoscopy;  Laterality: N/A;   ESOPHAGOGASTRODUODENOSCOPY (EGD) WITH PROPOFOL N/A 04/24/2021   Surgeon: Eloise Harman, DO;  duodenal erosions and gastritis biopsied (pathology with peptic duodenitis, reactive gastropathy with erosions/chronic inflammation, negative for H. pylori)   EYE SURGERY     Vatrectomy   HEMOSTASIS CLIP PLACEMENT  05/14/2021   Procedure: HEMOSTASIS CLIP PLACEMENT;  Surgeon: Daneil Dolin,  MD;  [facility];  Service: Endoscopy;;   IR PERC TUN PERIT CATH WO PORT S&I /IMAG  09/15/2020   IR REMOVAL TUN CV CATH W/O FL  02/19/2021   IR US GUIDE VASC ACCESS RIGHT  09/15/2020   POLYPECTOMY  04/24/2021   Procedure: POLYPECTOMY;  Surgeon: Eloise Harman, DO;  [facility];  Service: Endoscopy;;   POLYPECTOMY  05/14/2021   Procedure: POLYPECTOMY;  Surgeon: Daneil Dolin, MD;  [facility];  Service: Endoscopy;;   SKIN SPLIT GRAFT Bilateral 09/03/2021   Procedure: SKIN GRAFT BILATERAL LEGS;  Surgeon: Newt Minion, MD;  Location: [facility];  Service: Orthopedics;  Laterality: Bilateral;   SKIN SPLIT GRAFT Left 12/10/2021   Procedure: IRRIGATION AND DEBRIDEMENT LEFT CALF, APPLICATION SPLIT THICKNESS SKIN GRAFT;  Surgeon: Newt Minion, MD;  Location: [facility];  Service: Orthopedics;  Laterality: Left;   TOE SURGERY     Social History   Occupational History   Not on file  Tobacco Use   Smoking status: Never   Smokeless tobacco: Never  Vaping Use   Vaping Use: Never used  Substance and Sexual Activity   Alcohol use: No   Drug use: No   Sexual activity: Yes    Birth control/protection: Condom

## 2022-05-10 ENCOUNTER — Telehealth: Payer: Self-pay | Admitting: Orthopedic Surgery

## 2022-05-10 LAB — GLUCOSE, CAPILLARY
Glucose-Capillary: 193 mg/dL — ABNORMAL HIGH (ref 70–99)
Glucose-Capillary: 188 mg/dL — ABNORMAL HIGH (ref 70–99)

## 2022-05-10 NOTE — Telephone Encounter (Signed)
Received call from [place] with [facility] needing verbal orders to continue [place]. The number to contact Malachy Mood is [phone]

## 2022-05-11 ENCOUNTER — Telehealth: Payer: Self-pay | Admitting: Family

## 2022-05-11 NOTE — Telephone Encounter (Signed)
Mardene Celeste (RN) called for RN  wound care orders. [place] states for etiology, frequently wound care, and exact wound care. Please call Mardene Celeste at [phone] as soon as possible

## 2022-05-11 NOTE — Telephone Encounter (Signed)
Called, given VO to [place]

## 2022-05-11 NOTE — Telephone Encounter (Signed)
I SW Amy Moses, gave her VO on current wound care. She asked me to also fax orders to her at [phone]

## 2022-05-11 NOTE — Telephone Encounter (Signed)
Faxed

## 2022-05-12 ENCOUNTER — Telehealth: Payer: Self-pay | Admitting: Orthopedic Surgery

## 2022-05-12 NOTE — Telephone Encounter (Signed)
Pt called requesting a call back for clarification for wound care. Please call pt at [phone]

## 2022-05-12 NOTE — Telephone Encounter (Signed)
SW [facility] nurse, Mardene Celeste, she needed clarification on wound care orders. Pt would not let her touch her left leg. She does have an appt tomorrow for redressing it. She only let her rewrap the right leg. States that she has been using silvadene and dry dressing changes. I did not have that on last OV notes. Will address with [facility] tomorrow. Also Mardene Celeste states that pt's living condition is not safe for using dial soap and water cleansing. No safe basin that can be sanitized afterwards. She will use wound cleanser/saline on wounds. Needs to be written to do so.

## 2022-05-13 ENCOUNTER — Ambulatory Visit (INDEPENDENT_AMBULATORY_CARE_PROVIDER_SITE_OTHER): Payer: Medicare Other

## 2022-05-13 ENCOUNTER — Ambulatory Visit: Payer: Medicare Other | Admitting: Family

## 2022-05-13 NOTE — Progress Notes (Signed)
Pt came in today for a dynaflex wrap to LLE and silvadene and dry dressing changes to RLE. Pt tolerated well and will come into office next Friday to see Denny Peon, NP.

## 2022-05-17 ENCOUNTER — Ambulatory Visit: Payer: Medicare Other | Admitting: Cardiovascular Disease

## 2022-05-20 ENCOUNTER — Encounter: Payer: Self-pay | Admitting: Family

## 2022-05-20 ENCOUNTER — Ambulatory Visit (INDEPENDENT_AMBULATORY_CARE_PROVIDER_SITE_OTHER): Payer: Medicare Other | Admitting: Family

## 2022-05-20 DIAGNOSIS — L97923 Non-pressure chronic ulcer of unspecified part of left lower leg with necrosis of muscle: Secondary | ICD-10-CM

## 2022-05-20 DIAGNOSIS — I96 Gangrene, not elsewhere classified: Secondary | ICD-10-CM

## 2022-05-20 DIAGNOSIS — L97911 Non-pressure chronic ulcer of unspecified part of right lower leg limited to breakdown of skin: Secondary | ICD-10-CM

## 2022-05-20 DIAGNOSIS — L98491 Non-pressure chronic ulcer of skin of other sites limited to breakdown of skin: Secondary | ICD-10-CM

## 2022-05-20 NOTE — Progress Notes (Unsigned)
Office Visit Note   Patient: Amy Moses           Date of Birth: 11-Sep-1971           MRN: [id number] Visit Date: 05/20/2022              Requested by: Emelda Fear, [place] [address] PCP: Emelda Fear, DO  Chief Complaint  Patient presents with   Right Leg - Follow-up    S/p 5th ray amputation 02/16/22   Left Leg - Follow-up      HPI: Patient is a 51 year old woman who presents status post treatment for calciphylaxis both legs and status post right foot fifth ray amputation.  Has been having dry dressings Ace wrap for compression to the right lower extremity with Dynaflex compression on the left  Assessment & Plan: Visit Diagnoses:  1. Calciphylaxis of left lower extremity with nonhealing ulcer with necrosis of muscle (HCC)   2. Calciphylaxis of right lower extremity with nonhealing ulcer, limited to breakdown of skin ([place])   3. Gangrene of right foot ([place])   4. Ischemic ulcer, limited to breakdown of skin ([place])     Plan: Continue current treatment with compression on the left, dry dressing on the right.  Follow-Up Instructions: Return in about 2 weeks (around 06/03/2022).   Ortho Exam  Patient is alert, oriented, no adenopathy, well-dressed, normal affect, normal respiratory effort. The left calf wound continues to heal.  Please see attached photo.  The right calf wound with superficial granulation bleeding.  Wound 3 cm in diameter the lateral right foot wound continues to heal with improved granulation tissue.  The ray amputation site along the lateral column of the right foot is open this is 6 cm in length 2 cm in width filled in with 50% granulation 50% fibrinous tissue there is scant serous drainage no odor no purulence no erythema  patient did have an MRI scan that showed some edema in the bone laterally to the right foot however with the improvement of the soft tissue healing we will follow this expectantly.  Imaging: No results  found. No images are attached to the encounter.  Labs: Lab Results  Component Value Date   HGBA1C 11.5 (H) 02/15/2022   HGBA1C 13.0 12/30/2021   HGBA1C 12.2 (H) 12/10/2021   ESRSEDRATE 34 (H) 02/14/2022   ESRSEDRATE 40 (H) 08/07/2021   CRP 7.0 (H) 02/14/2022   CRP 11.1 (H) 08/07/2021   REPTSTATUS 05/02/2022 FINAL 04/29/2022   GRAMSTAIN  07/08/2021    RARE WBC PRESENT,BOTH PMN AND MONONUCLEAR RARE GRAM POSITIVE RODS RARE GRAM POSITIVE COCCI IN PAIRS    CULT (A) 04/29/2022    CITROBACTER KOSERI SUSCEPTIBILITIES PERFORMED ON PREVIOUS CULTURE WITHIN THE LAST 5 DAYS. Performed at [facility], [place] [address], [place], [place] [address]    LABORGA CITROBACTER KOSERI 04/28/2022     Lab Results  Component Value Date   ALBUMIN 2.9 (L) 05/03/2022   ALBUMIN 3.1 (L) 05/02/2022   ALBUMIN 3.3 (L) 04/30/2022   PREALBUMIN 13.7 (L) 08/07/2021    Lab Results  Component Value Date   MG 2.1 04/30/2022   MG 2.1 05/14/2021   MG 2.8 (H) 05/11/2021   Lab Results  Component Value Date   VD25OH 36.9 12/30/2021    Lab Results  Component Value Date   PREALBUMIN 13.7 (L) 08/07/2021      Latest Ref Rng & Units 05/03/2022    4:36 AM 04/30/2022  6:31 AM 04/29/2022   10:40 PM  CBC EXTENDED  WBC 4.0 - 10.5 K/uL 4.9  8.9  9.0   RBC 3.87 - 5.11 MIL/uL 3.66  3.65  3.86   Hemoglobin 12.0 - 15.0 g/dL 10.5  10.5  11.2   HCT 36.0 - 46.0 % 34.7  34.9  36.5   Platelets 150 - 400 K/uL 146  145  157   NEUT# 1.7 - 7.7 K/uL   7.5   Lymph# 0.7 - 4.0 K/uL   0.9      There is no height or weight on file to calculate BMI.  Orders:  No orders of the defined types were placed in this encounter.  No orders of the defined types were placed in this encounter.    Procedures: No procedures performed  Clinical Data: No additional findings.  ROS:  All other systems negative, except as noted in the HPI. Review of Systems  Objective: Vital Signs: LMP 08/22/2015 (Approximate)    Specialty Comments:  No specialty comments available.  PMFS History: Patient Active Problem List   Diagnosis Date Noted   GERD (gastroesophageal reflux disease) 04/30/2022   Bacteremia 04/29/2022   Non-adherence to medical treatment 03/16/2022   Cutaneous abscess of right foot    Osteomyelitis of foot ([place]) 02/14/2022   Obesity (BMI 30-39.9) 01/26/2022   Ischemic ulcer of right foot ([place]) 01/24/2022   Normocytic anemia 01/24/2022   Sacral pressure ulcer 05/69/7948   Metabolic acidosis, increased anion gap 01/24/2022   Leg wound, left, sequela 12/10/2021   Open leg wound 09/03/2021   Wound infection    Non-pressure chronic ulcer of right calf limited to breakdown of skin ([place])    Calciphylaxis of right lower extremity with nonhealing ulcer, limited to breakdown of skin ([place])    Chronic ulcer of left thigh ([place]) 08/07/2021   Calciphylaxis of left lower extremity with nonhealing ulcer with necrosis of muscle ([place]) 08/07/2021   Lower GI bleed 05/12/2021   Acute GI bleeding 04/24/2021   Acute blood loss anemia 04/23/2021   GI bleed 04/22/2021   Fever 03/31/2021   Acute on chronic heart failure with preserved ejection fraction (HFpEF) ([place]) 03/30/2021   End stage renal disease ([place]) 11/16/2020   Calciphylaxis 11/06/2020   Non-healing open wound of heel 11/03/2020   Diabetic foot infection ([place]) 11/01/2020   Decubitus ulcer, heel 11/01/2020   Closed nondisplaced fracture of left patella 01/65/5374   Metabolic acidosis 82/70/7867   Acute on chronic renal failure ([place]) 06/10/2020   Acute pericardial effusion 06/10/2020   Chronic kidney disease, stage 4 (severe) ([place]) 03/05/2019   Vitamin D deficiency 01/28/2019   Type 2 diabetes mellitus with ESRD (end-stage renal disease) ([place]) 09/21/2015   Mixed hyperlipidemia 09/21/2015   Essential hypertension 09/21/2015   Acquired hypothyroidism 09/21/2015   Iris bomb 07/31/2012   Secondary angle-closure glaucoma 07/31/2012   Past  Medical History:  Diagnosis Date   Anemia    Blindness of right eye with low vision in contralateral eye    s/p victrectomy   Diabetes mellitus, type II ([place])    Dyslipidemia    Glaucoma    History of blood transfusion    Hypertension    Hypothyroidism (acquired)    Kidney disease    Stage 5   Pneumonia     Family History  Problem Relation Age of Onset   Heart disease Mother    Diabetes Mother    Kidney disease Mother    Diabetes Father  Heart disease Father    Diabetes Brother    Colon cancer Neg Hx     Past Surgical History:  Procedure Laterality Date   ABDOMINAL AORTOGRAM W/LOWER EXTREMITY Bilateral 12/18/2020   Procedure: ABDOMINAL AORTOGRAM W/LOWER EXTREMITY;  Surgeon: Elam Dutch, MD;  Location: [facility];  Service: Cardiovascular;  Laterality: Bilateral;   ABDOMINAL AORTOGRAM W/LOWER EXTREMITY Bilateral 01/25/2022   Procedure: ABDOMINAL AORTOGRAM W/LOWER EXTREMITY;  Surgeon: Serafina Mitchell, MD;  Location: [facility];  Service: Cardiovascular;  Laterality: Bilateral;   AMPUTATION Right 02/16/2022   Procedure: RIGHT FOOT 5TH RAY AMPUTATION;  Surgeon: Newt Minion, MD;  Location: [facility];  Service: Orthopedics;  Laterality: Right;   ANKLE FRACTURE SURGERY Right    AV FISTULA PLACEMENT Left 08/18/2020   Procedure: LEFT ARM BRACHIOCEPHALIC ARTERIOVENOUS (AV) FISTULA CREATION;  Surgeon: Elam Dutch, MD;  Location: [facility];  Service: Vascular;  Laterality: Left;   BIOPSY  04/24/2021   Procedure: BIOPSY;  Surgeon: Eloise Harman, DO;  [facility];  Service: Endoscopy;;   CESAREAN SECTION     CHOLECYSTECTOMY     COLONOSCOPY  04/24/2021   Surgeon: Eloise Harman, DO;  nonbleeding internal hemorrhoids, 1 large (25 mm) pedunculated transverse colon polyp (prolapse type polyp) with adherent clot and stigmata of recent bleed.   COLONOSCOPY WITH PROPOFOL N/A 05/14/2021   Procedure: COLONOSCOPY WITH PROPOFOL;  Surgeon: Daneil Dolin,  MD;  [facility];  Service: Endoscopy;  Laterality: N/A;   ESOPHAGOGASTRODUODENOSCOPY (EGD) WITH PROPOFOL N/A 04/24/2021   Surgeon: Eloise Harman, DO;  duodenal erosions and gastritis biopsied (pathology with peptic duodenitis, reactive gastropathy with erosions/chronic inflammation, negative for H. pylori)   EYE SURGERY     Vatrectomy   HEMOSTASIS CLIP PLACEMENT  05/14/2021   Procedure: HEMOSTASIS CLIP PLACEMENT;  Surgeon: Daneil Dolin, MD;  [facility];  Service: Endoscopy;;   IR PERC TUN PERIT CATH WO PORT S&I /IMAG  09/15/2020   IR REMOVAL TUN CV CATH W/O FL  02/19/2021   IR US GUIDE VASC ACCESS RIGHT  09/15/2020   POLYPECTOMY  04/24/2021   Procedure: POLYPECTOMY;  Surgeon: Eloise Harman, DO;  [facility];  Service: Endoscopy;;   POLYPECTOMY  05/14/2021   Procedure: POLYPECTOMY;  Surgeon: Daneil Dolin, MD;  [facility];  Service: Endoscopy;;   SKIN SPLIT GRAFT Bilateral 09/03/2021   Procedure: SKIN GRAFT BILATERAL LEGS;  Surgeon: Newt Minion, MD;  Location: [facility];  Service: Orthopedics;  Laterality: Bilateral;   SKIN SPLIT GRAFT Left 12/10/2021   Procedure: IRRIGATION AND DEBRIDEMENT LEFT CALF, APPLICATION SPLIT THICKNESS SKIN GRAFT;  Surgeon: Newt Minion, MD;  Location: [facility];  Service: Orthopedics;  Laterality: Left;   TOE SURGERY     Social History   Occupational History   Not on file  Tobacco Use   Smoking status: Never   Smokeless tobacco: Never  Vaping Use   Vaping Use: Never used  Substance and Sexual Activity   Alcohol use: No   Drug use: No   Sexual activity: Yes    Birth control/protection: Condom

## 2022-05-27 ENCOUNTER — Ambulatory Visit (INDEPENDENT_AMBULATORY_CARE_PROVIDER_SITE_OTHER): Payer: Medicare Other | Admitting: Family

## 2022-05-27 ENCOUNTER — Encounter: Payer: Self-pay | Admitting: Family

## 2022-05-27 DIAGNOSIS — L97923 Non-pressure chronic ulcer of unspecified part of left lower leg with necrosis of muscle: Secondary | ICD-10-CM

## 2022-05-27 DIAGNOSIS — L98491 Non-pressure chronic ulcer of skin of other sites limited to breakdown of skin: Secondary | ICD-10-CM

## 2022-05-27 DIAGNOSIS — L97911 Non-pressure chronic ulcer of unspecified part of right lower leg limited to breakdown of skin: Secondary | ICD-10-CM

## 2022-05-27 DIAGNOSIS — M86071 Acute hematogenous osteomyelitis, right ankle and foot: Secondary | ICD-10-CM

## 2022-05-27 NOTE — Progress Notes (Signed)
Post-Op Visit Note   Patient: Amy Moses           Date of Birth: 1971-08-07           MRN: [id number] Visit Date: 05/27/2022 PCP: Emelda Fear, DO  Chief Complaint:  Chief Complaint  Patient presents with   Right Foot - Wound Check   Left Leg - Wound Check    HPI:  HPI The patient is a 51 year old woman who presents today in routine follow-up she is coming in once weekly for Dynaflex compression wraps and dressing changes Dynaflex to the left lower extremity Silvadene dressing to the fifth ray amputation on the right Ortho Exam On examination of the left leg the wound continues to improve please see attached images.  There is good wrinkling of the skin from compression serosanguineous drainage which is minimal no concerning sign of the left.  On the right calf she has superficial granulation this continues to improve this is unchanged in size the ray amputation looks well there is about 50% fibrinous exudative tissue again today no purulence no surrounding erythema no maceration Slow improvement  Visit Diagnoses: No diagnosis found.  Plan: We will continue with daily wound cleansing and Silvadene dressings to the right ray amputation as well as her posterior calf on the right.  Dynaflex to be changed once weekly on the left  Follow-Up Instructions: No follow-ups on file.   Imaging: No results found.  Orders:  No orders of the defined types were placed in this encounter.  No orders of the defined types were placed in this encounter.    PMFS History: Patient Active Problem List   Diagnosis Date Noted   GERD (gastroesophageal reflux disease) 04/30/2022   Bacteremia 04/29/2022   Non-adherence to medical treatment 03/16/2022   Cutaneous abscess of right foot    Osteomyelitis of foot ([place]) 02/14/2022   Obesity (BMI 30-39.9) 01/26/2022   Ischemic ulcer of right foot ([place]) 01/24/2022   Normocytic anemia 01/24/2022   Sacral pressure ulcer 87/56/4332   Metabolic  acidosis, increased anion gap 01/24/2022   Leg wound, left, sequela 12/10/2021   Open leg wound 09/03/2021   Wound infection    Non-pressure chronic ulcer of right calf limited to breakdown of skin ([place])    Calciphylaxis of right lower extremity with nonhealing ulcer, limited to breakdown of skin ([place])    Chronic ulcer of left thigh ([place]) 08/07/2021   Calciphylaxis of left lower extremity with nonhealing ulcer with necrosis of muscle ([place]) 08/07/2021   Lower GI bleed 05/12/2021   Acute GI bleeding 04/24/2021   Acute blood loss anemia 04/23/2021   GI bleed 04/22/2021   Fever 03/31/2021   Acute on chronic heart failure with preserved ejection fraction (HFpEF) ([place]) 03/30/2021   End stage renal disease ([place]) 11/16/2020   Calciphylaxis 11/06/2020   Non-healing open wound of heel 11/03/2020   Diabetic foot infection ([place]) 11/01/2020   Decubitus ulcer, heel 11/01/2020   Closed nondisplaced fracture of left patella 95/18/8416   Metabolic acidosis 60/63/0160   Acute on chronic renal failure ([place]) 06/10/2020   Acute pericardial effusion 06/10/2020   Chronic kidney disease, stage 4 (severe) ([place]) 03/05/2019   Vitamin D deficiency 01/28/2019   Type 2 diabetes mellitus with ESRD (end-stage renal disease) ([place]) 09/21/2015   Mixed hyperlipidemia 09/21/2015   Essential hypertension 09/21/2015   Acquired hypothyroidism 09/21/2015   Iris bomb 07/31/2012   Secondary angle-closure glaucoma 07/31/2012   Past Medical History:  Diagnosis Date   Anemia  Blindness of right eye with low vision in contralateral eye    s/p victrectomy   Diabetes mellitus, type II (HCC)    Dyslipidemia    Glaucoma    History of blood transfusion    Hypertension    Hypothyroidism (acquired)    Kidney disease    Stage 5   Pneumonia     Family History  Problem Relation Age of Onset   Heart disease Mother    Diabetes Mother    Kidney disease Mother    Diabetes Father    Heart disease Father    Diabetes  Brother    Colon cancer Neg Hx     Past Surgical History:  Procedure Laterality Date   ABDOMINAL AORTOGRAM W/LOWER EXTREMITY Bilateral 12/18/2020   Procedure: ABDOMINAL AORTOGRAM W/LOWER EXTREMITY;  Surgeon: Elam Dutch, MD;  Location: [facility];  Service: Cardiovascular;  Laterality: Bilateral;   ABDOMINAL AORTOGRAM W/LOWER EXTREMITY Bilateral 01/25/2022   Procedure: ABDOMINAL AORTOGRAM W/LOWER EXTREMITY;  Surgeon: Serafina Mitchell, MD;  Location: [facility];  Service: Cardiovascular;  Laterality: Bilateral;   AMPUTATION Right 02/16/2022   Procedure: RIGHT FOOT 5TH RAY AMPUTATION;  Surgeon: Newt Minion, MD;  Location: [facility];  Service: Orthopedics;  Laterality: Right;   ANKLE FRACTURE SURGERY Right    AV FISTULA PLACEMENT Left 08/18/2020   Procedure: LEFT ARM BRACHIOCEPHALIC ARTERIOVENOUS (AV) FISTULA CREATION;  Surgeon: Elam Dutch, MD;  Location: [facility];  Service: Vascular;  Laterality: Left;   BIOPSY  04/24/2021   Procedure: BIOPSY;  Surgeon: Eloise Harman, DO;  [facility];  Service: Endoscopy;;   CESAREAN SECTION     CHOLECYSTECTOMY     COLONOSCOPY  04/24/2021   Surgeon: Eloise Harman, DO;  nonbleeding internal hemorrhoids, 1 large (25 mm) pedunculated transverse colon polyp (prolapse type polyp) with adherent clot and stigmata of recent bleed.   COLONOSCOPY WITH PROPOFOL N/A 05/14/2021   Procedure: COLONOSCOPY WITH PROPOFOL;  Surgeon: Daneil Dolin, MD;  [facility];  Service: Endoscopy;  Laterality: N/A;   ESOPHAGOGASTRODUODENOSCOPY (EGD) WITH PROPOFOL N/A 04/24/2021   Surgeon: Eloise Harman, DO;  duodenal erosions and gastritis biopsied (pathology with peptic duodenitis, reactive gastropathy with erosions/chronic inflammation, negative for H. pylori)   EYE SURGERY     Vatrectomy   HEMOSTASIS CLIP PLACEMENT  05/14/2021   Procedure: HEMOSTASIS CLIP PLACEMENT;  Surgeon: Daneil Dolin, MD;  [facility];   Service: Endoscopy;;   IR PERC TUN PERIT CATH WO PORT S&I /IMAG  09/15/2020   IR REMOVAL TUN CV CATH W/O FL  02/19/2021   IR US GUIDE VASC ACCESS RIGHT  09/15/2020   POLYPECTOMY  04/24/2021   Procedure: POLYPECTOMY;  Surgeon: Eloise Harman, DO;  [facility];  Service: Endoscopy;;   POLYPECTOMY  05/14/2021   Procedure: POLYPECTOMY;  Surgeon: Daneil Dolin, MD;  [facility];  Service: Endoscopy;;   SKIN SPLIT GRAFT Bilateral 09/03/2021   Procedure: SKIN GRAFT BILATERAL LEGS;  Surgeon: Newt Minion, MD;  Location: [facility];  Service: Orthopedics;  Laterality: Bilateral;   SKIN SPLIT GRAFT Left 12/10/2021   Procedure: IRRIGATION AND DEBRIDEMENT LEFT CALF, APPLICATION SPLIT THICKNESS SKIN GRAFT;  Surgeon: Newt Minion, MD;  Location: [facility];  Service: Orthopedics;  Laterality: Left;   TOE SURGERY     Social History   Occupational History   Not on file  Tobacco Use   Smoking status: Never  Smokeless tobacco: Never  Vaping Use   Vaping Use: Never used  Substance and Sexual Activity   Alcohol use: No   Drug use: No   Sexual activity: Yes    Birth control/protection: Condom

## 2022-06-03 ENCOUNTER — Ambulatory Visit (INDEPENDENT_AMBULATORY_CARE_PROVIDER_SITE_OTHER): Payer: Medicare Other | Admitting: Family

## 2022-06-03 DIAGNOSIS — L97923 Non-pressure chronic ulcer of unspecified part of left lower leg with necrosis of muscle: Secondary | ICD-10-CM | POA: Diagnosis not present

## 2022-06-03 DIAGNOSIS — L97911 Non-pressure chronic ulcer of unspecified part of right lower leg limited to breakdown of skin: Secondary | ICD-10-CM

## 2022-06-03 DIAGNOSIS — I96 Gangrene, not elsewhere classified: Secondary | ICD-10-CM

## 2022-06-07 ENCOUNTER — Encounter: Payer: Self-pay | Admitting: Family

## 2022-06-07 NOTE — Progress Notes (Signed)
Office Visit Note   Patient: Amy Moses           Date of Birth: 09-Nov-1971           MRN: [id number] Visit Date: 06/03/2022              Requested by: Emelda Fear, [place] [address] PCP: Emelda Fear, DO  Chief Complaint  Patient presents with   Right Leg - Follow-up, Wound Check   Left Leg - Follow-up, Wound Check      HPI: The patient is a 51 year old woman who presents today in follow-up she is status post fifth ray amputation of the right foot in March of this year we have also been providing wound care to bilateral calves posterior ulcers due to calciphylaxis.  We have been applying Silvadene dressings to the ray amputation on the right using Ace wrap for compression to the right lower extremity applying Dynaflex compression to the left lower extremity changing this weekly.  Assessment & Plan: Visit Diagnoses: No diagnosis found.  Plan: Continue with current wound care follow-up in 1 week she will be changing the dressing to her right foot and calf daily with Silvadene  Follow-Up Instructions: No follow-ups on file.   Ortho Exam  Patient is alert, oriented, no adenopathy, well-dressed, normal affect, normal respiratory effort.  On examination bilateral lower extremity she does have lichenification which is much improved she has been debriding weekly using Aquaphor. On examination of the left leg the wound continues to improve please see attached images.  There is good wrinkling of the skin from compression serosanguineous drainage which is minimal no concerning sign of the left.  On the right calf she has superficial granulation this continues to improve this is unchanged in size the ray amputation looks well there is about 50% fibrinous exudative tissue again today no purulence no surrounding erythema no maceration Slow improvement  Imaging: No results found. No images are attached to the encounter.  Labs: Lab Results  Component  Value Date   HGBA1C 11.5 (H) 02/15/2022   HGBA1C 13.0 12/30/2021   HGBA1C 12.2 (H) 12/10/2021   ESRSEDRATE 34 (H) 02/14/2022   ESRSEDRATE 40 (H) 08/07/2021   CRP 7.0 (H) 02/14/2022   CRP 11.1 (H) 08/07/2021   REPTSTATUS 05/02/2022 FINAL 04/29/2022   GRAMSTAIN  07/08/2021    RARE WBC PRESENT,BOTH PMN AND MONONUCLEAR RARE GRAM POSITIVE RODS RARE GRAM POSITIVE COCCI IN PAIRS    CULT (A) 04/29/2022    CITROBACTER KOSERI SUSCEPTIBILITIES PERFORMED ON PREVIOUS CULTURE WITHIN THE LAST 5 DAYS. Performed at [facility], [place] [address], [place], [place] [address]    LABORGA CITROBACTER KOSERI 04/28/2022     Lab Results  Component Value Date   ALBUMIN 2.9 (L) 05/03/2022   ALBUMIN 3.1 (L) 05/02/2022   ALBUMIN 3.3 (L) 04/30/2022   PREALBUMIN 13.7 (L) 08/07/2021    Lab Results  Component Value Date   MG 2.1 04/30/2022   MG 2.1 05/14/2021   MG 2.8 (H) 05/11/2021   Lab Results  Component Value Date   VD25OH 36.9 12/30/2021    Lab Results  Component Value Date   PREALBUMIN 13.7 (L) 08/07/2021      Latest Ref Rng & Units 05/03/2022    4:36 AM 04/30/2022    6:31 AM 04/29/2022   10:40 PM  CBC EXTENDED  WBC 4.0 - 10.5 K/uL 4.9  8.9  9.0   RBC 3.87 - 5.11 MIL/uL 3.66  3.65  3.86   Hemoglobin 12.0 - 15.0 g/dL 10.5  10.5  11.2   HCT 36.0 - 46.0 % 34.7  34.9  36.5   Platelets 150 - 400 K/uL 146  145  157   NEUT# 1.7 - 7.7 K/uL   7.5   Lymph# 0.7 - 4.0 K/uL   0.9      There is no height or weight on file to calculate BMI.  Orders:  No orders of the defined types were placed in this encounter.  No orders of the defined types were placed in this encounter.    Procedures: No procedures performed  Clinical Data: No additional findings.  ROS:  All other systems negative, except as noted in the HPI. Review of Systems  Objective: Vital Signs: LMP 08/22/2015 (Approximate)   Specialty Comments:  No specialty comments available.  PMFS History: Patient Active  Problem List   Diagnosis Date Noted   GERD (gastroesophageal reflux disease) 04/30/2022   Bacteremia 04/29/2022   Non-adherence to medical treatment 03/16/2022   Cutaneous abscess of right foot    Osteomyelitis of foot ([place]) 02/14/2022   Obesity (BMI 30-39.9) 01/26/2022   Ischemic ulcer of right foot ([place]) 01/24/2022   Normocytic anemia 01/24/2022   Sacral pressure ulcer 16/08/9603   Metabolic acidosis, increased anion gap 01/24/2022   Leg wound, left, sequela 12/10/2021   Open leg wound 09/03/2021   Wound infection    Non-pressure chronic ulcer of right calf limited to breakdown of skin ([place])    Calciphylaxis of right lower extremity with nonhealing ulcer, limited to breakdown of skin ([place])    Chronic ulcer of left thigh ([place]) 08/07/2021   Calciphylaxis of left lower extremity with nonhealing ulcer with necrosis of muscle ([place]) 08/07/2021   Lower GI bleed 05/12/2021   Acute GI bleeding 04/24/2021   Acute blood loss anemia 04/23/2021   GI bleed 04/22/2021   Fever 03/31/2021   Acute on chronic heart failure with preserved ejection fraction (HFpEF) ([place]) 03/30/2021   End stage renal disease ([place]) 11/16/2020   Calciphylaxis 11/06/2020   Non-healing open wound of heel 11/03/2020   Diabetic foot infection ([place]) 11/01/2020   Decubitus ulcer, heel 11/01/2020   Closed nondisplaced fracture of left patella 54/07/8118   Metabolic acidosis 14/78/2956   Acute on chronic renal failure ([place]) 06/10/2020   Acute pericardial effusion 06/10/2020   Chronic kidney disease, stage 4 (severe) ([place]) 03/05/2019   Vitamin D deficiency 01/28/2019   Type 2 diabetes mellitus with ESRD (end-stage renal disease) ([place]) 09/21/2015   Mixed hyperlipidemia 09/21/2015   Essential hypertension 09/21/2015   Acquired hypothyroidism 09/21/2015   Iris bomb 07/31/2012   Secondary angle-closure glaucoma 07/31/2012   Past Medical History:  Diagnosis Date   Anemia    Blindness of right eye with low vision in  contralateral eye    s/p victrectomy   Diabetes mellitus, type II ([place])    Dyslipidemia    Glaucoma    History of blood transfusion    Hypertension    Hypothyroidism (acquired)    Kidney disease    Stage 5   Pneumonia     Family History  Problem Relation Age of Onset   Heart disease Mother    Diabetes Mother    Kidney disease Mother    Diabetes Father    Heart disease Father    Diabetes Brother    Colon cancer Neg Hx     Past Surgical History:  Procedure Laterality Date   ABDOMINAL AORTOGRAM  W/LOWER EXTREMITY Bilateral 12/18/2020   Procedure: ABDOMINAL AORTOGRAM W/LOWER EXTREMITY;  Surgeon: Elam Dutch, MD;  Location: [facility];  Service: Cardiovascular;  Laterality: Bilateral;   ABDOMINAL AORTOGRAM W/LOWER EXTREMITY Bilateral 01/25/2022   Procedure: ABDOMINAL AORTOGRAM W/LOWER EXTREMITY;  Surgeon: Serafina Mitchell, MD;  Location: [facility];  Service: Cardiovascular;  Laterality: Bilateral;   AMPUTATION Right 02/16/2022   Procedure: RIGHT FOOT 5TH RAY AMPUTATION;  Surgeon: Newt Minion, MD;  Location: [facility];  Service: Orthopedics;  Laterality: Right;   ANKLE FRACTURE SURGERY Right    AV FISTULA PLACEMENT Left 08/18/2020   Procedure: LEFT ARM BRACHIOCEPHALIC ARTERIOVENOUS (AV) FISTULA CREATION;  Surgeon: Elam Dutch, MD;  Location: [facility];  Service: Vascular;  Laterality: Left;   BIOPSY  04/24/2021   Procedure: BIOPSY;  Surgeon: Eloise Harman, DO;  [facility];  Service: Endoscopy;;   CESAREAN SECTION     CHOLECYSTECTOMY     COLONOSCOPY  04/24/2021   Surgeon: Eloise Harman, DO;  nonbleeding internal hemorrhoids, 1 large (25 mm) pedunculated transverse colon polyp (prolapse type polyp) with adherent clot and stigmata of recent bleed.   COLONOSCOPY WITH PROPOFOL N/A 05/14/2021   Procedure: COLONOSCOPY WITH PROPOFOL;  Surgeon: Daneil Dolin, MD;  [facility];  Service: Endoscopy;  Laterality: N/A;    ESOPHAGOGASTRODUODENOSCOPY (EGD) WITH PROPOFOL N/A 04/24/2021   Surgeon: Eloise Harman, DO;  duodenal erosions and gastritis biopsied (pathology with peptic duodenitis, reactive gastropathy with erosions/chronic inflammation, negative for H. pylori)   EYE SURGERY     Vatrectomy   HEMOSTASIS CLIP PLACEMENT  05/14/2021   Procedure: HEMOSTASIS CLIP PLACEMENT;  Surgeon: Daneil Dolin, MD;  [facility];  Service: Endoscopy;;   IR PERC TUN PERIT CATH WO PORT S&I /IMAG  09/15/2020   IR REMOVAL TUN CV CATH W/O FL  02/19/2021   IR US GUIDE VASC ACCESS RIGHT  09/15/2020   POLYPECTOMY  04/24/2021   Procedure: POLYPECTOMY;  Surgeon: Eloise Harman, DO;  [facility];  Service: Endoscopy;;   POLYPECTOMY  05/14/2021   Procedure: POLYPECTOMY;  Surgeon: Daneil Dolin, MD;  [facility];  Service: Endoscopy;;   SKIN SPLIT GRAFT Bilateral 09/03/2021   Procedure: SKIN GRAFT BILATERAL LEGS;  Surgeon: Newt Minion, MD;  Location: [facility];  Service: Orthopedics;  Laterality: Bilateral;   SKIN SPLIT GRAFT Left 12/10/2021   Procedure: IRRIGATION AND DEBRIDEMENT LEFT CALF, APPLICATION SPLIT THICKNESS SKIN GRAFT;  Surgeon: Newt Minion, MD;  Location: [facility];  Service: Orthopedics;  Laterality: Left;   TOE SURGERY     Social History   Occupational History   Not on file  Tobacco Use   Smoking status: Never   Smokeless tobacco: Never  Vaping Use   Vaping Use: Never used  Substance and Sexual Activity   Alcohol use: No   Drug use: No   Sexual activity: Yes    Birth control/protection: Condom

## 2022-06-08 ENCOUNTER — Telehealth: Payer: Self-pay | Admitting: Family

## 2022-06-10 ENCOUNTER — Ambulatory Visit (INDEPENDENT_AMBULATORY_CARE_PROVIDER_SITE_OTHER): Payer: Medicare Other | Admitting: Family

## 2022-06-10 DIAGNOSIS — L97911 Non-pressure chronic ulcer of unspecified part of right lower leg limited to breakdown of skin: Secondary | ICD-10-CM

## 2022-06-10 DIAGNOSIS — M86171 Other acute osteomyelitis, right ankle and foot: Secondary | ICD-10-CM

## 2022-06-10 DIAGNOSIS — L97923 Non-pressure chronic ulcer of unspecified part of left lower leg with necrosis of muscle: Secondary | ICD-10-CM

## 2022-06-17 ENCOUNTER — Ambulatory Visit (INDEPENDENT_AMBULATORY_CARE_PROVIDER_SITE_OTHER): Payer: Medicare Other | Admitting: Family

## 2022-06-17 DIAGNOSIS — L97911 Non-pressure chronic ulcer of unspecified part of right lower leg limited to breakdown of skin: Secondary | ICD-10-CM | POA: Diagnosis not present

## 2022-06-17 DIAGNOSIS — L97923 Non-pressure chronic ulcer of unspecified part of left lower leg with necrosis of muscle: Secondary | ICD-10-CM

## 2022-06-17 DIAGNOSIS — M86071 Acute hematogenous osteomyelitis, right ankle and foot: Secondary | ICD-10-CM | POA: Diagnosis not present

## 2022-06-22 ENCOUNTER — Encounter: Payer: Self-pay | Admitting: Family

## 2022-06-22 NOTE — Progress Notes (Signed)
Office Visit Note   Patient: Amy Moses           Date of Birth: 03/01/1971           MRN: [id number] Visit Date: 06/17/2022              Requested by: Emelda Fear, [place] [address] PCP: Emelda Fear, DO  Chief Complaint  Patient presents with   Right Leg - Wound Check    S/p 5th ray amputation Calciphylaxis   Left Leg - Follow-up, Edema    Calciphylaxis      HPI: The patient is a 51 year old woman who is seen today in routine follow-up she has had ray amputation of the right foot as well as bilateral posterior calf ulcers due to calciphylaxis we have been doing Silvadene dressing changes to the right foot with Ace wrap to her right lower extremity as well as Dynaflex to the left lower extremity.  She is currently being set up with lymphedema pumps bilaterally  Assessment & Plan: Visit Diagnoses: No diagnosis found.  Plan: Due to persistent edema and improvement in her ulcer on the right foot we will go ahead and place her in bilateral Dynaflex compression wraps.  We will request the assistance of home health so that they can do dressing changes every other week she will come to the office for [name] to evaluate and do dressing changes every other week  Follow-Up Instructions: Return in about 1 week (around 06/24/2022).   Ortho Exam  Patient is alert, oriented, no adenopathy, well-dressed, normal affect, normal respiratory effort. On examination of the left lower extremity there is controlled edema from the area of the wrap distally with improvement in her skin less lichenification.  The wound is stable and filled in with 100% granulation there is minimal bleeding posteriorly no erythema no warmth to the right lower extremity she again has a stable improved ulcer to the posterior calf please see attached photos.  Along the lateral column of the right foot there is ulcer following dehiscence of her ray amputation this is 8 cm in length 1 cm in width  with 20% granulation 80% fibrinous exudative tissue there is no active drainage no erythema no purulence no warmth  Imaging: No results found. No images are attached to the encounter.  Labs: Lab Results  Component Value Date   HGBA1C 11.5 (H) 02/15/2022   HGBA1C 13.0 12/30/2021   HGBA1C 12.2 (H) 12/10/2021   ESRSEDRATE 34 (H) 02/14/2022   ESRSEDRATE 40 (H) 08/07/2021   CRP 7.0 (H) 02/14/2022   CRP 11.1 (H) 08/07/2021   REPTSTATUS 05/02/2022 FINAL 04/29/2022   GRAMSTAIN  07/08/2021    RARE WBC PRESENT,BOTH PMN AND MONONUCLEAR RARE GRAM POSITIVE RODS RARE GRAM POSITIVE COCCI IN PAIRS    CULT (A) 04/29/2022    CITROBACTER KOSERI SUSCEPTIBILITIES PERFORMED ON PREVIOUS CULTURE WITHIN THE LAST 5 DAYS. Performed at [facility], [place] [address] [place], [place] [address]    LABORGA CITROBACTER KOSERI 04/28/2022     Lab Results  Component Value Date   ALBUMIN 2.9 (L) 05/03/2022   ALBUMIN 3.1 (L) 05/02/2022   ALBUMIN 3.3 (L) 04/30/2022   PREALBUMIN 13.7 (L) 08/07/2021    Lab Results  Component Value Date   MG 2.1 04/30/2022   MG 2.1 05/14/2021   MG 2.8 (H) 05/11/2021   Lab Results  Component Value Date   VD25OH 36.9 12/30/2021    Lab Results  Component Value  Date   PREALBUMIN 13.7 (L) 08/07/2021      Latest Ref Rng & Units 05/03/2022    4:36 AM 04/30/2022    6:31 AM 04/29/2022   10:40 PM  CBC EXTENDED  WBC 4.0 - 10.5 K/uL 4.9  8.9  9.0   RBC 3.87 - 5.11 MIL/uL 3.66  3.65  3.86   Hemoglobin 12.0 - 15.0 g/dL 10.5  10.5  11.2   HCT 36.0 - 46.0 % 34.7  34.9  36.5   Platelets 150 - 400 K/uL 146  145  157   NEUT# 1.7 - 7.7 K/uL   7.5   Lymph# 0.7 - 4.0 K/uL   0.9      There is no height or weight on file to calculate BMI.  Orders:  No orders of the defined types were placed in this encounter.  No orders of the defined types were placed in this encounter.    Procedures: No procedures performed  Clinical Data: No additional findings.  ROS:  All  other systems negative, except as noted in the HPI. Review of Systems  Objective: Vital Signs: LMP 08/22/2015 (Approximate)   Specialty Comments:  No specialty comments available.  PMFS History: Patient Active Problem List   Diagnosis Date Noted   GERD (gastroesophageal reflux disease) 04/30/2022   Bacteremia 04/29/2022   Non-adherence to medical treatment 03/16/2022   Cutaneous abscess of right foot    Osteomyelitis of foot ([place]) 02/14/2022   Obesity (BMI 30-39.9) 01/26/2022   Ischemic ulcer of right foot ([place]) 01/24/2022   Normocytic anemia 01/24/2022   Sacral pressure ulcer 24/23/5361   Metabolic acidosis, increased anion gap 01/24/2022   Leg wound, left, sequela 12/10/2021   Open leg wound 09/03/2021   Wound infection    Non-pressure chronic ulcer of right calf limited to breakdown of skin ([place])    Calciphylaxis of right lower extremity with nonhealing ulcer, limited to breakdown of skin ([place])    Chronic ulcer of left thigh ([place]) 08/07/2021   Calciphylaxis of left lower extremity with nonhealing ulcer with necrosis of muscle ([place]) 08/07/2021   Lower GI bleed 05/12/2021   Acute GI bleeding 04/24/2021   Acute blood loss anemia 04/23/2021   GI bleed 04/22/2021   Fever 03/31/2021   Acute on chronic heart failure with preserved ejection fraction (HFpEF) ([place]) 03/30/2021   End stage renal disease ([place]) 11/16/2020   Calciphylaxis 11/06/2020   Non-healing open wound of heel 11/03/2020   Diabetic foot infection ([place]) 11/01/2020   Decubitus ulcer, heel 11/01/2020   Closed nondisplaced fracture of left patella 44/31/5400   Metabolic acidosis 86/76/1950   Acute on chronic renal failure ([place]) 06/10/2020   Acute pericardial effusion 06/10/2020   Chronic kidney disease, stage 4 (severe) ([place]) 03/05/2019   Vitamin D deficiency 01/28/2019   Type 2 diabetes mellitus with ESRD (end-stage renal disease) ([place]) 09/21/2015   Mixed hyperlipidemia 09/21/2015   Essential hypertension  09/21/2015   Acquired hypothyroidism 09/21/2015   Iris bomb 07/31/2012   Secondary angle-closure glaucoma 07/31/2012   Past Medical History:  Diagnosis Date   Anemia    Blindness of right eye with low vision in contralateral eye    s/p victrectomy   Diabetes mellitus, type II ([place])    Dyslipidemia    Glaucoma    History of blood transfusion    Hypertension    Hypothyroidism (acquired)    Kidney disease    Stage 5   Pneumonia     Family History  Problem Relation  Age of Onset   Heart disease Mother    Diabetes Mother    Kidney disease Mother    Diabetes Father    Heart disease Father    Diabetes Brother    Colon cancer Neg Hx     Past Surgical History:  Procedure Laterality Date   ABDOMINAL AORTOGRAM W/LOWER EXTREMITY Bilateral 12/18/2020   Procedure: ABDOMINAL AORTOGRAM W/LOWER EXTREMITY;  Surgeon: Elam Dutch, MD;  Location: [facility];  Service: Cardiovascular;  Laterality: Bilateral;   ABDOMINAL AORTOGRAM W/LOWER EXTREMITY Bilateral 01/25/2022   Procedure: ABDOMINAL AORTOGRAM W/LOWER EXTREMITY;  Surgeon: Serafina Mitchell, MD;  Location: [facility];  Service: Cardiovascular;  Laterality: Bilateral;   AMPUTATION Right 02/16/2022   Procedure: RIGHT FOOT 5TH RAY AMPUTATION;  Surgeon: Newt Minion, MD;  Location: [facility];  Service: Orthopedics;  Laterality: Right;   ANKLE FRACTURE SURGERY Right    AV FISTULA PLACEMENT Left 08/18/2020   Procedure: LEFT ARM BRACHIOCEPHALIC ARTERIOVENOUS (AV) FISTULA CREATION;  Surgeon: Elam Dutch, MD;  Location: [facility];  Service: Vascular;  Laterality: Left;   BIOPSY  04/24/2021   Procedure: BIOPSY;  Surgeon: Eloise Harman, DO;  [facility];  Service: Endoscopy;;   CESAREAN SECTION     CHOLECYSTECTOMY     COLONOSCOPY  04/24/2021   Surgeon: Eloise Harman, DO;  nonbleeding internal hemorrhoids, 1 large (25 mm) pedunculated transverse colon polyp (prolapse type polyp) with adherent clot and stigmata  of recent bleed.   COLONOSCOPY WITH PROPOFOL N/A 05/14/2021   Procedure: COLONOSCOPY WITH PROPOFOL;  Surgeon: Daneil Dolin, MD;  [facility];  Service: Endoscopy;  Laterality: N/A;   ESOPHAGOGASTRODUODENOSCOPY (EGD) WITH PROPOFOL N/A 04/24/2021   Surgeon: Eloise Harman, DO;  duodenal erosions and gastritis biopsied (pathology with peptic duodenitis, reactive gastropathy with erosions/chronic inflammation, negative for H. pylori)   EYE SURGERY     Vatrectomy   HEMOSTASIS CLIP PLACEMENT  05/14/2021   Procedure: HEMOSTASIS CLIP PLACEMENT;  Surgeon: Daneil Dolin, MD;  [facility];  Service: Endoscopy;;   IR PERC TUN PERIT CATH WO PORT S&I /IMAG  09/15/2020   IR REMOVAL TUN CV CATH W/O FL  02/19/2021   IR US GUIDE VASC ACCESS RIGHT  09/15/2020   POLYPECTOMY  04/24/2021   Procedure: POLYPECTOMY;  Surgeon: Eloise Harman, DO;  [facility];  Service: Endoscopy;;   POLYPECTOMY  05/14/2021   Procedure: POLYPECTOMY;  Surgeon: Daneil Dolin, MD;  [facility];  Service: Endoscopy;;   SKIN SPLIT GRAFT Bilateral 09/03/2021   Procedure: SKIN GRAFT BILATERAL LEGS;  Surgeon: Newt Minion, MD;  Location: [facility];  Service: Orthopedics;  Laterality: Bilateral;   SKIN SPLIT GRAFT Left 12/10/2021   Procedure: IRRIGATION AND DEBRIDEMENT LEFT CALF, APPLICATION SPLIT THICKNESS SKIN GRAFT;  Surgeon: Newt Minion, MD;  Location: [facility];  Service: Orthopedics;  Laterality: Left;   TOE SURGERY     Social History   Occupational History   Not on file  Tobacco Use   Smoking status: Never   Smokeless tobacco: Never  Vaping Use   Vaping Use: Never used  Substance and Sexual Activity   Alcohol use: No   Drug use: No   Sexual activity: Yes    Birth control/protection: Condom

## 2022-06-22 NOTE — Progress Notes (Signed)
Office Visit Note   Patient: Amy Moses           Date of Birth: Oct 24, 1971           MRN: [id number] Visit Date: 06/10/2022              Requested by: Emelda Fear, [place] [address] PCP: Emelda Fear, DO  Chief Complaint  Patient presents with  . Right Leg - Wound Check, Edema  . Left Leg - Wound Check, Edema  . Right Foot - Follow-up    S/p 5th ray amputation      HPI: The patient is a 51 year old woman who has been seen weekly status post right fifth ray amputation in March of this year which unfortunately did dehisce.  She has persistent ulcers to bilateral calves from calciphylaxis.  Assessment & Plan: Visit Diagnoses:  1. Other acute osteomyelitis of right foot ([place])   2. Calciphylaxis of right lower extremity with nonhealing ulcer, limited to breakdown of skin ([place])   3. Calciphylaxis of left lower extremity with nonhealing ulcer with necrosis of muscle ([place])     Plan: We will continue with assistance from home health for Silvadene dressing changes to the right foot amputation site as well as her posterior calf.  Dynaflex compression to the left lower extremity  Follow-Up Instructions: Return in about 1 week (around 06/17/2022).   Ortho Exam  Patient is alert, oriented, no adenopathy, well-dressed, normal affect, normal respiratory effort. See attached images  On examination of the left leg her wound is stable there is no surrounding erythema no maceration no purulence no odor.  Good control of the edema below the layer of the compression wrap.  On examination of the right lower extremity she does have significant edema mild erythema of the foot no cellulitis her incision is dehisced and filled in with 50% granulation 50% fibrinous exudate tissue there is scant active drainage, serous.  The calf wound is stable.  Imaging: No results found.    Labs: Lab Results  Component Value Date   HGBA1C 11.5 (H) 02/15/2022   HGBA1C 13.0  12/30/2021   HGBA1C 12.2 (H) 12/10/2021   ESRSEDRATE 34 (H) 02/14/2022   ESRSEDRATE 40 (H) 08/07/2021   CRP 7.0 (H) 02/14/2022   CRP 11.1 (H) 08/07/2021   REPTSTATUS 05/02/2022 FINAL 04/29/2022   GRAMSTAIN  07/08/2021    RARE WBC PRESENT,BOTH PMN AND MONONUCLEAR RARE GRAM POSITIVE RODS RARE GRAM POSITIVE COCCI IN PAIRS    CULT (A) 04/29/2022    CITROBACTER KOSERI SUSCEPTIBILITIES PERFORMED ON PREVIOUS CULTURE WITHIN THE LAST 5 DAYS. Performed at [facility], [place] [address], [place], [place] [address]    LABORGA CITROBACTER KOSERI 04/28/2022     Lab Results  Component Value Date   ALBUMIN 2.9 (L) 05/03/2022   ALBUMIN 3.1 (L) 05/02/2022   ALBUMIN 3.3 (L) 04/30/2022   PREALBUMIN 13.7 (L) 08/07/2021    Lab Results  Component Value Date   MG 2.1 04/30/2022   MG 2.1 05/14/2021   MG 2.8 (H) 05/11/2021   Lab Results  Component Value Date   VD25OH 36.9 12/30/2021    Lab Results  Component Value Date   PREALBUMIN 13.7 (L) 08/07/2021      Latest Ref Rng & Units 05/03/2022    4:36 AM 04/30/2022    6:31 AM 04/29/2022   10:40 PM  CBC EXTENDED  WBC 4.0 - 10.5 K/uL 4.9  8.9  9.0   RBC  3.87 - 5.11 MIL/uL 3.66  3.65  3.86   Hemoglobin 12.0 - 15.0 g/dL 10.5  10.5  11.2   HCT 36.0 - 46.0 % 34.7  34.9  36.5   Platelets 150 - 400 K/uL 146  145  157   NEUT# 1.7 - 7.7 K/uL   7.5   Lymph# 0.7 - 4.0 K/uL   0.9      There is no height or weight on file to calculate BMI.  Orders:  No orders of the defined types were placed in this encounter.  No orders of the defined types were placed in this encounter.    Procedures: No procedures performed  Clinical Data: No additional findings.  ROS:  All other systems negative, except as noted in the HPI. Review of Systems  Objective: Vital Signs: LMP 08/22/2015 (Approximate)   Specialty Comments:  No specialty comments available.  PMFS History: Patient Active Problem List   Diagnosis Date Noted  . GERD  (gastroesophageal reflux disease) 04/30/2022  . Bacteremia 04/29/2022  . Non-adherence to medical treatment 03/16/2022  . Cutaneous abscess of right foot   . Osteomyelitis of foot ([place]) 02/14/2022  . Obesity (BMI 30-39.9) 01/26/2022  . Ischemic ulcer of right foot ([place]) 01/24/2022  . Normocytic anemia 01/24/2022  . Sacral pressure ulcer 01/24/2022  . Metabolic acidosis, increased anion gap 01/24/2022  . Leg wound, left, sequela 12/10/2021  . Open leg wound 09/03/2021  . Wound infection   . Non-pressure chronic ulcer of right calf limited to breakdown of skin ([place])   . Calciphylaxis of right lower extremity with nonhealing ulcer, limited to breakdown of skin ([place])   . Chronic ulcer of left thigh ([place]) 08/07/2021  . Calciphylaxis of left lower extremity with nonhealing ulcer with necrosis of muscle ([place]) 08/07/2021  . Lower GI bleed 05/12/2021  . Acute GI bleeding 04/24/2021  . Acute blood loss anemia 04/23/2021  . GI bleed 04/22/2021  . Fever 03/31/2021  . Acute on chronic heart failure with preserved ejection fraction (HFpEF) ([place]) 03/30/2021  . End stage renal disease ([place]) 11/16/2020  . Calciphylaxis 11/06/2020  . Non-healing open wound of heel 11/03/2020  . Diabetic foot infection ([place]) 11/01/2020  . Decubitus ulcer, heel 11/01/2020  . Closed nondisplaced fracture of left patella 10/29/2020  . Metabolic acidosis 95/62/1308  . Acute on chronic renal failure ([place]) 06/10/2020  . Acute pericardial effusion 06/10/2020  . Chronic kidney disease, stage 4 (severe) ([place]) 03/05/2019  . Vitamin D deficiency 01/28/2019  . Type 2 diabetes mellitus with ESRD (end-stage renal disease) ([place]) 09/21/2015  . Mixed hyperlipidemia 09/21/2015  . Essential hypertension 09/21/2015  . Acquired hypothyroidism 09/21/2015  . Iris bomb 07/31/2012  . Secondary angle-closure glaucoma 07/31/2012   Past Medical History:  Diagnosis Date  . Anemia   . Blindness of right eye with low vision in  contralateral eye    s/p victrectomy  . Diabetes mellitus, type II ([place])   . Dyslipidemia   . Glaucoma   . History of blood transfusion   . Hypertension   . Hypothyroidism (acquired)   . Kidney disease    Stage 5  . Pneumonia     Family History  Problem Relation Age of Onset  . Heart disease Mother   . Diabetes Mother   . Kidney disease Mother   . Diabetes Father   . Heart disease Father   . Diabetes Brother   . Colon cancer Neg Hx     Past Surgical History:  Procedure  Laterality Date  . ABDOMINAL AORTOGRAM W/LOWER EXTREMITY Bilateral 12/18/2020   Procedure: ABDOMINAL AORTOGRAM W/LOWER EXTREMITY;  Surgeon: Elam Dutch, MD;  Location: [facility];  Service: Cardiovascular;  Laterality: Bilateral;  . ABDOMINAL AORTOGRAM W/LOWER EXTREMITY Bilateral 01/25/2022   Procedure: ABDOMINAL AORTOGRAM W/LOWER EXTREMITY;  Surgeon: Serafina Mitchell, MD;  Location: [facility];  Service: Cardiovascular;  Laterality: Bilateral;  . AMPUTATION Right 02/16/2022   Procedure: RIGHT FOOT 5TH RAY AMPUTATION;  Surgeon: Newt Minion, MD;  Location: [facility];  Service: Orthopedics;  Laterality: Right;  . ANKLE FRACTURE SURGERY Right   . AV FISTULA PLACEMENT Left 08/18/2020   Procedure: LEFT ARM BRACHIOCEPHALIC ARTERIOVENOUS (AV) FISTULA CREATION;  Surgeon: Elam Dutch, MD;  Location: [facility];  Service: Vascular;  Laterality: Left;  . BIOPSY  04/24/2021   Procedure: BIOPSY;  Surgeon: Eloise Harman, DO;  [facility];  Service: Endoscopy;;  . CESAREAN SECTION    . CHOLECYSTECTOMY    . COLONOSCOPY  04/24/2021   Surgeon: Eloise Harman, DO;  nonbleeding internal hemorrhoids, 1 large (25 mm) pedunculated transverse colon polyp (prolapse type polyp) with adherent clot and stigmata of recent bleed.  . COLONOSCOPY WITH PROPOFOL N/A 05/14/2021   Procedure: COLONOSCOPY WITH PROPOFOL;  Surgeon: Daneil Dolin, MD;  [facility];  Service: Endoscopy;  Laterality:  N/A;  . ESOPHAGOGASTRODUODENOSCOPY (EGD) WITH PROPOFOL N/A 04/24/2021   Surgeon: Eloise Harman, DO;  duodenal erosions and gastritis biopsied (pathology with peptic duodenitis, reactive gastropathy with erosions/chronic inflammation, negative for H. pylori)  . EYE SURGERY     Vatrectomy  . HEMOSTASIS CLIP PLACEMENT  05/14/2021   Procedure: HEMOSTASIS CLIP PLACEMENT;  Surgeon: Daneil Dolin, MD;  [facility];  Service: Endoscopy;;  . IR PERC TUN PERIT CATH WO PORT S&I [place]  09/15/2020  . IR REMOVAL TUN CV CATH W/O FL  02/19/2021  . IR US GUIDE VASC ACCESS RIGHT  09/15/2020  . POLYPECTOMY  04/24/2021   Procedure: POLYPECTOMY;  Surgeon: Eloise Harman, DO;  [facility];  Service: Endoscopy;;  . POLYPECTOMY  05/14/2021   Procedure: POLYPECTOMY;  Surgeon: Daneil Dolin, MD;  [facility];  Service: Endoscopy;;  . SKIN SPLIT GRAFT Bilateral 09/03/2021   Procedure: SKIN GRAFT BILATERAL LEGS;  Surgeon: Newt Minion, MD;  Location: [facility];  Service: Orthopedics;  Laterality: Bilateral;  . SKIN SPLIT GRAFT Left 12/10/2021   Procedure: IRRIGATION AND DEBRIDEMENT LEFT CALF, APPLICATION SPLIT THICKNESS SKIN GRAFT;  Surgeon: Newt Minion, MD;  Location: [facility];  Service: Orthopedics;  Laterality: Left;  . TOE SURGERY     Social History   Occupational History  . Not on file  Tobacco Use  . Smoking status: Never  . Smokeless tobacco: Never  Vaping Use  . Vaping Use: Never used  Substance and Sexual Activity  . Alcohol use: No  . Drug use: No  . Sexual activity: Yes    Birth control/protection: Condom

## 2022-06-24 ENCOUNTER — Ambulatory Visit (INDEPENDENT_AMBULATORY_CARE_PROVIDER_SITE_OTHER): Payer: Medicare Other | Admitting: Cardiovascular Disease

## 2022-06-24 ENCOUNTER — Ambulatory Visit: Payer: Medicare Other | Admitting: Family

## 2022-06-24 ENCOUNTER — Encounter (HOSPITAL_BASED_OUTPATIENT_CLINIC_OR_DEPARTMENT_OTHER): Payer: Self-pay | Admitting: Cardiovascular Disease

## 2022-06-24 VITALS — BP 122/68 | HR 83 | Ht 66.0 in | Wt 205.0 lb

## 2022-06-24 DIAGNOSIS — I10 Essential (primary) hypertension: Secondary | ICD-10-CM | POA: Diagnosis not present

## 2022-06-24 DIAGNOSIS — I5033 Acute on chronic diastolic (congestive) heart failure: Secondary | ICD-10-CM

## 2022-06-24 DIAGNOSIS — I5042 Chronic combined systolic (congestive) and diastolic (congestive) heart failure: Secondary | ICD-10-CM | POA: Diagnosis not present

## 2022-06-24 NOTE — Assessment & Plan Note (Signed)
Resolved since starting dialysis.

## 2022-06-24 NOTE — Progress Notes (Signed)
Cardiology Office Note:    Date:  06/24/2022   ID:  Amy Moses, DOB [birth year], MRN [id number]  PCP:  Emelda Fear, DO   [place] Providers Cardiologist:  None     Referring MD: Emelda Fear, DO   No chief complaint on file.   History of Present Illness:    Amy Moses is a 51 y.o. female with a hx of diabetes, hypertension, hyperlipidemia, ESRD on HD, right eye blindness, and hypothyroidism. She was admitted to [facility] 6/23 with bacteremia. Plans were made for 4 weeks of antibiotics. She had non-healing lower extremity wounds and is followed by vascular and orthopedics. She had an Echo that revealed LVEF 45-50% with global hypokinesis and grade 2 diastolic dysfunction. PASP was 54 mm Hg. They recommended cardiology follow-up.   Today, she is accompanied by her daughter. She states she is feeling good. She does not believe she has had any issues with her heart before. She has never experienced any chest pain or breathing difficulties. She endorses that for a long time she has avoided laying flat on her back due to experiencing shortness of breath, described as "like she is suffocating". She usually sits up or props herself up on pillows, which she has needed to do since prior to her dialysis. She has been on dialysis for a year and a half and has had edema in her bilateral feet. Her edema is improved since she has been on dialysis. She states that before she went on dialysis she was on two blood pressure medications, but has since stopped them as her blood pressure is more controlled. Typically her blood pressure is lower than today's reading of 122/68. She does not smoke. She denies any palpitations or chest pain. No lightheadedness, headaches, syncope, or PND.    Past Medical History:  Diagnosis Date   Anemia    Blindness of right eye with low vision in contralateral eye    s/p victrectomy   Diabetes mellitus, type II ([place])    Dyslipidemia     Glaucoma    History of blood transfusion    Hypertension    Hypothyroidism (acquired)    Kidney disease    Stage 5   Pneumonia     Past Surgical History:  Procedure Laterality Date   ABDOMINAL AORTOGRAM W/LOWER EXTREMITY Bilateral 12/18/2020   Procedure: ABDOMINAL AORTOGRAM W/LOWER EXTREMITY;  Surgeon: Elam Dutch, MD;  Location: [facility];  Service: Cardiovascular;  Laterality: Bilateral;   ABDOMINAL AORTOGRAM W/LOWER EXTREMITY Bilateral 01/25/2022   Procedure: ABDOMINAL AORTOGRAM W/LOWER EXTREMITY;  Surgeon: Serafina Mitchell, MD;  Location: [place] [facility];  Service: Cardiovascular;  Laterality: Bilateral;   AMPUTATION Right 02/16/2022   Procedure: RIGHT FOOT 5TH RAY AMPUTATION;  Surgeon: Newt Minion, MD;  Location: [place];  Service: Orthopedics;  Laterality: Right;   ANKLE FRACTURE SURGERY Right    AV FISTULA PLACEMENT Left 08/18/2020   Procedure: LEFT ARM BRACHIOCEPHALIC ARTERIOVENOUS (AV) FISTULA CREATION;  Surgeon: Elam Dutch, MD;  Location: [place];  Service: Vascular;  Laterality: Left;   BIOPSY  04/24/2021   Procedure: BIOPSY;  Surgeon: Eloise Harman, DO;  [facility];  Service: Endoscopy;;   CESAREAN SECTION     CHOLECYSTECTOMY     COLONOSCOPY  04/24/2021   Surgeon: Eloise Harman, DO;  nonbleeding internal hemorrhoids, 1 large (25 mm) pedunculated transverse colon polyp (prolapse type polyp) with adherent clot and stigmata of recent bleed.   COLONOSCOPY  WITH PROPOFOL N/A 05/14/2021   Procedure: COLONOSCOPY WITH PROPOFOL;  Surgeon: Daneil Dolin, MD;  [facility];  Service: Endoscopy;  Laterality: N/A;   ESOPHAGOGASTRODUODENOSCOPY (EGD) WITH PROPOFOL N/A 04/24/2021   Surgeon: Eloise Harman, DO;  duodenal erosions and gastritis biopsied (pathology with peptic duodenitis, reactive gastropathy with erosions/chronic inflammation, negative for H. pylori)   EYE SURGERY     Vatrectomy   HEMOSTASIS CLIP PLACEMENT  05/14/2021    Procedure: HEMOSTASIS CLIP PLACEMENT;  Surgeon: Daneil Dolin, MD;  [facility];  Service: Endoscopy;;   IR PERC TUN PERIT CATH WO PORT S&I /IMAG  09/15/2020   IR REMOVAL TUN CV CATH W/O FL  02/19/2021   IR US GUIDE VASC ACCESS RIGHT  09/15/2020   POLYPECTOMY  04/24/2021   Procedure: POLYPECTOMY;  Surgeon: Eloise Harman, DO;  [facility];  Service: Endoscopy;;   POLYPECTOMY  05/14/2021   Procedure: POLYPECTOMY;  Surgeon: Daneil Dolin, MD;  [facility];  Service: Endoscopy;;   SKIN SPLIT GRAFT Bilateral 09/03/2021   Procedure: SKIN GRAFT BILATERAL LEGS;  Surgeon: Newt Minion, MD;  Location: [place];  Service: Orthopedics;  Laterality: Bilateral;   SKIN SPLIT GRAFT Left 12/10/2021   Procedure: IRRIGATION AND DEBRIDEMENT LEFT CALF, APPLICATION SPLIT THICKNESS SKIN GRAFT;  Surgeon: Newt Minion, MD;  Location: [place];  Service: Orthopedics;  Laterality: Left;   TOE SURGERY      Current Medications: Current Meds  Medication Sig   acetaminophen (TYLENOL) 500 MG tablet Take 1,000 mg by mouth every 6 (six) hours as needed for moderate pain.   atorvastatin (LIPITOR) 10 MG tablet Take 1 tablet (10 mg total) by mouth daily. (Patient taking differently: Take 10 mg by mouth at bedtime.)   AURYXIA 1 GM 210 MG(Fe) tablet Take 420 mg by mouth 2 (two) times daily with a meal.   Blood Glucose Monitoring Suppl (ACCU-CHEK GUIDE ME) w/Device KIT 1 Piece by Does not apply route as directed.   Continuous Blood Gluc Receiver (FREESTYLE LIBRE 2 READER) DEVI As directed   Continuous Blood Gluc Sensor (FREESTYLE LIBRE 2 SENSOR) MISC 1 Piece by Does not apply route every 14 (fourteen) days.   glucose blood (ACCU-CHEK GUIDE) test strip Use as instructed   HUMALOG KWIKPEN 100 UNIT/ML KwikPen Inject 5-11 Units into the skin 3 (three) times daily with meals. If eats 50% or more of meal.   insulin glargine (LANTUS) 100 UNIT/ML injection Inject 0.2 mLs (20 Units total) into  the skin at bedtime.   levothyroxine (SYNTHROID) 75 MCG tablet Take 75 mcg by mouth daily.   polyethylene glycol (MIRALAX / GLYCOLAX) 17 g packet Take 17 g by mouth daily as needed for mild constipation.   silver sulfADIAZINE (SILVADENE) 1 % cream Apply 1 application. topically daily. Apply to affected area daily plus dry dressing   Vitamin D, Ergocalciferol, (DRISDOL) 1.25 MG (50000 UNIT) CAPS capsule Take 50,000 Units by mouth every 14 (fourteen) days.     Allergies:   Ace inhibitors   Social History   Socioeconomic History   Marital status: Single    Spouse name: Not on file   Number of children: Not on file   Years of education: Not on file   Highest education level: Not on file  Occupational History   Not on file  Tobacco Use   Smoking status: Never   Smokeless tobacco: Never  Vaping Use   Vaping Use: Never used  Substance and Sexual Activity   Alcohol use: No   Drug use: No   Sexual activity: Yes    Birth control/protection: Condom  Other Topics Concern   Not on file  Social History Narrative   Not on file   Social Determinants of Health   Financial Resource Strain: Not on file  Food Insecurity: Not on file  Transportation Needs: Not on file  Physical Activity: Not on file  Stress: Not on file  Social Connections: Not on file     Family History: The patient's family history includes Diabetes in her brother, father, and mother; Heart disease in her father and mother; Heart failure in her father, maternal grandfather, maternal grandmother, and mother; Kidney disease in her mother; Transient ischemic attack in her maternal grandfather. There is no history of Colon cancer.  ROS:   Please see the history of present illness. (+) Orthopnea (+) Bilateral feet edema     All other systems reviewed and are negative.  EKGs/Labs/Other Studies Reviewed:    The following studies were reviewed today:  Echocardiogram  05/02/2022  1. Left ventricular ejection fraction, by  estimation, is 45 to 50%. Left  ventricular ejection fraction by PLAX is 46 %. The left ventricle has  mildly decreased function. The left ventricle demonstrates global  hypokinesis. Left ventricular diastolic  parameters are consistent with Grade II diastolic dysfunction  (pseudonormalization). Elevated left ventricular end-diastolic pressure.   2. Right ventricular systolic function is mildly reduced. The right  ventricular size is normal. There is moderately elevated pulmonary artery  systolic pressure. The estimated right ventricular systolic pressure is  96.2 mmHg.   3. Left atrial size was severely dilated.   4. Right atrial size was mildly dilated.   5. The pericardial effusion is circumferential. There is no evidence of  cardiac tamponade.   6. The mitral valve is abnormal. Mild mitral valve regurgitation.   7. Tricuspid valve regurgitation is mild to moderate.   8. The aortic valve is tricuspid. Aortic valve regurgitation is not  visualized. Aortic valve sclerosis/calcification is present, without any  evidence of aortic stenosis. Aortic valve mean gradient measures 5.0 mmHg.   9. The inferior vena cava is dilated in size with >50% respiratory  variability, suggesting right atrial pressure of 8 mmHg.   Comparison(s): Changes from prior study are noted. 04/01/2021: LVEF 55-60%,  grade 2 DD.   Abdominal Aortogram 01/25/2022: Findings:             Aortogram: No obvious renal stenosis identified.  The infrarenal abdominal aorta is widely patent.  Bilateral common and external iliac arteries are widely patent.            Right Lower Extremity: Right common femoral profundofemoral and superficial femoral artery are widely patent.  The popliteal artery is widely patent.  There is two-vessel runoff via the peroneal and posterior tibial artery.  There is diffuse disease of the small vessels in the foot            Left Lower Extremity: Left common femoral profundofemoral and superficial  femoral artery are widely patent.  The popliteal artery is widely patent.  The peroneal artery is the dominant runoff.  There is reconstitution of the posterior tibial from peroneal collaterals at the ankle.  Anterior tibial artery appears to become diminutive in the lower leg.  Impression:            #1  No evidence of inflow or outflow stenosis            #  2  On the right, there is two-vessel runoff via the peroneal and posterior tibial artery without any lesions amenable to intervention            #3  Dominant runoff on the left is the peroneal artery  Echo 04/01/2021:  1. Left ventricular ejection fraction, by estimation, is 55 to 60%. The  left ventricle has normal function. The left ventricle has no regional  wall motion abnormalities. Left ventricular diastolic parameters are  consistent with Grade II diastolic  dysfunction (pseudonormalization).   2. Right ventricular systolic function is normal. The right ventricular  size is normal.   3. Left atrial size was mildly dilated.   4. A small pericardial effusion is present.   5. The mitral valve is normal in structure. No evidence of mitral valve  regurgitation.   6. Tricuspid valve regurgitation is mild to moderate.   7. The aortic valve is normal in structure. Aortic valve regurgitation is  not visualized.    EKG: EKG is personally reviewed.  06/24/22: EKG was not ordered.   Recent Labs: 01/24/2022: TSH 3.109 04/30/2022: ALT 13; Magnesium 2.1 05/03/2022: BUN 55; Creatinine, Ser 7.16; Hemoglobin 10.5; Platelets 146; Potassium 5.0; Sodium 135  Recent Lipid Panel    Component Value Date/Time   CHOL 85 01/26/2022 0555   TRIG 98 01/26/2022 0555   HDL 25 (L) 01/26/2022 0555   CHOLHDL 3.4 01/26/2022 0555   VLDL 20 01/26/2022 0555   LDLCALC 40 01/26/2022 0555     Physical Exam:    VS:  BP 122/68 (BP Location: Right Arm, Patient Position: Sitting, Cuff Size: Normal)   Pulse 83   Ht 5' 6"  (1.676 m)   Wt 205 lb (93 kg)   LMP  08/22/2015 (Approximate)   SpO2 98%   BMI 33.09 kg/m     Wt Readings from Last 3 Encounters:  06/24/22 205 lb (93 kg)  05/03/22 203 lb 14.8 oz (92.5 kg)  04/28/22 214 lb 15.2 oz (97.5 kg)    VS:  BP 122/68 (BP Location: Right Arm, Patient Position: Sitting, Cuff Size: Normal)   Pulse 83   Ht 5' 6"  (1.676 m)   Wt 205 lb (93 kg)   LMP 08/22/2015 (Approximate)   SpO2 98%   BMI 33.09 kg/m  , BMI Body mass index is 33.09 kg/m. GENERAL:  Well appearing; In wheelchair HEENT: Pupils equal round and reactive, fundi not visualized, oral mucosa unremarkable NECK:  No jugular venous distention, waveform within normal limits, carotid upstroke brisk and symmetric, no bruits, no thyromegaly LUNGS:  Clear to auscultation bilaterally HEART:  RRR.  PMI not displaced or sustained,S1 and S2 within normal limits, no S3, no S4, no clicks, no rubs, VI/VI systolic murmur throughout ABD:  Flat, positive bowel sounds normal in frequency in pitch, no bruits, no rebound, no guarding, no midline pulsatile mass, no hepatomegaly, no splenomegaly EXT:  Unable to assess due to leg wrappings in place SKIN:  No rashes no nodules NEURO:  Cranial nerves II through XII grossly intact, motor grossly intact throughout PSYCH:  Cognitively intact, oriented to person place and time   ASSESSMENT:    1. Chronic combined systolic and diastolic heart failure ([place])   2. Acute on chronic heart failure with preserved ejection fraction (HFpEF) ([place])   3. Essential hypertension    PLAN:    In order of problems listed above:  Acute on chronic heart failure with preserved ejection fraction (HFpEF) ([facility]) LVEF was 45 to 50% in  the hospital.  She had global hypokinesis.  Pulmonary pressures were also elevated and right ventricular function was mildly reduced.  She also had a trivial pericardial effusion.  I suspect a lot of this was due to her acute illness.  We will repeat her echocardiogram and get a Lexiscan Myoview.  She has no  ischemic symptoms at this time but does have risk factors and is unable to get much exercise so we cannot otherwise rule out ischemia.  Blood pressure has been too low to start any GDMT.  Essential hypertension Resolved since starting dialysis.  Mixed hyperlipidemia Lipids are very well controlled on atorvastatin.  Her LDL was 40 on 01/2022.         Shared Decision Making/Informed Consent The risks [chest pain, shortness of breath, cardiac arrhythmias, dizziness, blood pressure fluctuations, myocardial infarction, stroke/transient ischemic attack, nausea, vomiting, allergic reaction, radiation exposure, metallic taste sensation and life-threatening complications (estimated to be 1 in 10,000)], benefits (risk stratification, diagnosing coronary artery disease, treatment guidance) and alternatives of a nuclear stress test were discussed in detail with Amy Moses and she agrees to proceed.   Follow up: 2 months  Medication Adjustments/Labs and Tests Ordered: Current medicines are reviewed at length with the patient today.  Concerns regarding medicines are outlined above.  Orders Placed This Encounter  Procedures   MYOCARDIAL PERFUSION IMAGING   ECHOCARDIOGRAM COMPLETE   No orders of the defined types were placed in this encounter.   Patient Instructions  Medication Instructions:  Your physician recommends that you continue on your current medications as directed. Please refer to the Current Medication list given to you today.   *If you need a refill on your cardiac medications before your next appointment, please call your pharmacy*  Lab Work: NONE  Testing/Procedures: Your physician has requested that you have a lexiscan myoview. For further information please visit [email or web address]. Please follow instruction sheet, as given.  Your physician has requested that you have an echocardiogram. Echocardiography is a painless test that uses sound waves to create images of your heart.  It provides your doctor with information about the size and shape of your heart and how well your heart's chambers and valves are working. This procedure takes approximately one hour. There are no restrictions for this procedure.  BOTH ABOVE AT [address] OFFICE ON SAME DAY IF POSSIBLE   Follow-Up: At [facility], you and your health needs are our priority.  As part of our continuing mission to provide you with exceptional heart care, we have created designated Provider Care Teams.  These Care Teams include your primary Cardiologist (physician) and Advanced Practice Providers (APPs -  Physician Assistants and Nurse Practitioners) who all work together to provide you with the care you need, when you need it.  We recommend signing up for the patient portal called "MyChart".  Sign up information is provided on this After Visit Summary.  MyChart is used to connect with patients for Virtual Visits (Telemedicine).  Patients are able to view lab/test results, encounter notes, upcoming appointments, etc.  Non-urgent messages can be sent to your provider as well.   To learn more about what you can do with MyChart, go to [email or web address].    Your next appointment:   2 month(s)  The format for your next appointment:   In Person  Provider:   Skeet Latch, MD or Laurann Montana, NP{          I,Breanna Adamick,acting as a scribe for Skeet Latch, MD.,have documented all  relevant documentation on the behalf of Skeet Latch, MD,as directed by  Skeet Latch, MD while in the presence of Skeet Latch, MD.   I, Atlantis Oval Linsey, MD have reviewed all documentation for this visit.  The documentation of the exam, diagnosis, procedures, and orders on 06/24/2022 are all accurate and complete.    Signed, Skeet Latch, MD  06/24/2022 2:30 PM    [place]

## 2022-06-24 NOTE — Assessment & Plan Note (Signed)
LVEF was 45 to 50% in the hospital.  She had global hypokinesis.  Pulmonary pressures were also elevated and right ventricular function was mildly reduced.  She also had a trivial pericardial effusion.  I suspect a lot of this was due to her acute illness.  We will repeat her echocardiogram and get a Lexiscan Myoview.  She has no ischemic symptoms at this time but does have risk factors and is unable to get much exercise so we cannot otherwise rule out ischemia.  Blood pressure has been too low to start any GDMT.

## 2022-06-24 NOTE — Patient Instructions (Signed)
Medication Instructions:  Your physician recommends that you continue on your current medications as directed. Please refer to the Current Medication list given to you today.   *If you need a refill on your cardiac medications before your next appointment, please call your pharmacy*  Lab Work: NONE  Testing/Procedures: Your physician has requested that you have a lexiscan myoview. For further information please visit [email or web address]. Please follow instruction sheet, as given.  Your physician has requested that you have an echocardiogram. Echocardiography is a painless test that uses sound waves to create images of your heart. It provides your doctor with information about the size and shape of your heart and how well your heart's chambers and valves are working. This procedure takes approximately one hour. There are no restrictions for this procedure.  BOTH ABOVE AT [address] OFFICE ON SAME DAY IF POSSIBLE   Follow-Up: At [facility], you and your health needs are our priority.  As part of our continuing mission to provide you with exceptional heart care, we have created designated Provider Care Teams.  These Care Teams include your primary Cardiologist (physician) and Advanced Practice Providers (APPs -  Physician Assistants and Nurse Practitioners) who all work together to provide you with the care you need, when you need it.  We recommend signing up for the patient portal called "MyChart".  Sign up information is provided on this After Visit Summary.  MyChart is used to connect with patients for Virtual Visits (Telemedicine).  Patients are able to view lab/test results, encounter notes, upcoming appointments, etc.  Non-urgent messages can be sent to your provider as well.   To learn more about what you can do with MyChart, go to [email or web address].    Your next appointment:   2 month(s)  The format for your next appointment:   In Person  Provider:   Skeet Latch, MD or  Laurann Montana, NP{

## 2022-06-24 NOTE — Assessment & Plan Note (Signed)
Lipids are very well controlled on atorvastatin.  Her LDL was 40 on 01/2022.

## 2022-07-01 ENCOUNTER — Encounter: Payer: Medicare Other | Admitting: Family

## 2022-07-06 ENCOUNTER — Telehealth (HOSPITAL_COMMUNITY): Payer: Self-pay | Admitting: *Deleted

## 2022-07-06 NOTE — Telephone Encounter (Signed)
Patient given detailed instructions per Myocardial Perfusion Study Information Sheet for the test on 07/11/22 at 10:30. Patient notified to arrive 15 minutes early and that it is imperative to arrive on time for appointment to keep from having the test rescheduled.  If you need to cancel or reschedule your appointment, please call the office within 24 hours of your appointment. . Patient verbalized understanding.Amy Moses

## 2022-07-08 ENCOUNTER — Encounter: Payer: Self-pay | Admitting: Family

## 2022-07-08 ENCOUNTER — Ambulatory Visit (INDEPENDENT_AMBULATORY_CARE_PROVIDER_SITE_OTHER): Payer: Medicare Other | Admitting: Family

## 2022-07-08 DIAGNOSIS — L97923 Non-pressure chronic ulcer of unspecified part of left lower leg with necrosis of muscle: Secondary | ICD-10-CM

## 2022-07-08 DIAGNOSIS — M86071 Acute hematogenous osteomyelitis, right ankle and foot: Secondary | ICD-10-CM

## 2022-07-08 DIAGNOSIS — L97911 Non-pressure chronic ulcer of unspecified part of right lower leg limited to breakdown of skin: Secondary | ICD-10-CM | POA: Diagnosis not present

## 2022-07-08 NOTE — Progress Notes (Signed)
Office Visit Note   Patient: Amy Moses           Date of Birth: Apr 13, 1971           MRN: [id number] Visit Date: 07/08/2022              Requested by: Emelda Fear, [place] [address] PCP: Emelda Fear, DO  Chief Complaint  Patient presents with   Right Foot - Wound Check    S/p 5th ray amputation Calciphylaxis   Left Leg - Wound Check    calciphylaxis      HPI: The patient is a 51 year old woman who is seen today in routine follow-up she has had ray amputation of the right foot as well as bilateral posterior calf ulcers due to calciphylaxis.  She has been having Dynaflex compression wraps bilaterally with the assistance of home health.  This has been changed once a week.  Assessment & Plan: Visit Diagnoses: No diagnosis found.  Plan: Quite pleased with the improvement in her ray amputation good control of the edema on the left lower extremity with compression wrapping the ulcer to her right calf is also nearly healed we will continue with current therapy   Follow-Up Instructions: Return in about 3 weeks (around 07/29/2022).   Ortho Exam  Patient is alert, oriented, no adenopathy, well-dressed, normal affect, normal respiratory effort. On examination of the left lower extremity there is controlled edema from the area of the wrap distally with improvement in her skin less lichenification.  The wound is stable and filled in with 100% granulation.  No active drainage today posteriorly no erythema no warmth to the right lower extremity.  This is decreased in size.  The right calf ulcer is well-healed.  The ray amputation of the right foot is now measuring 15 mm x 5 mm in size this is filled in with the 100% fibrinous exudate tissue this does not probe there is no surrounding drainage erythema warmth   Imaging: No results found. No images are attached to the encounter.  Labs: Lab Results  Component Value Date   HGBA1C 11.5 (H) 02/15/2022    HGBA1C 13.0 12/30/2021   HGBA1C 12.2 (H) 12/10/2021   ESRSEDRATE 34 (H) 02/14/2022   ESRSEDRATE 40 (H) 08/07/2021   CRP 7.0 (H) 02/14/2022   CRP 11.1 (H) 08/07/2021   REPTSTATUS 05/02/2022 FINAL 04/29/2022   GRAMSTAIN  07/08/2021    RARE WBC PRESENT,BOTH PMN AND MONONUCLEAR RARE GRAM POSITIVE RODS RARE GRAM POSITIVE COCCI IN PAIRS    CULT (A) 04/29/2022    CITROBACTER KOSERI SUSCEPTIBILITIES PERFORMED ON PREVIOUS CULTURE WITHIN THE LAST 5 DAYS. Performed at [facility], [place] [address] [place], [place] [address]    LABORGA CITROBACTER KOSERI 04/28/2022     Lab Results  Component Value Date   ALBUMIN 2.9 (L) 05/03/2022   ALBUMIN 3.1 (L) 05/02/2022   ALBUMIN 3.3 (L) 04/30/2022   PREALBUMIN 13.7 (L) 08/07/2021    Lab Results  Component Value Date   MG 2.1 04/30/2022   MG 2.1 05/14/2021   MG 2.8 (H) 05/11/2021   Lab Results  Component Value Date   VD25OH 36.9 12/30/2021    Lab Results  Component Value Date   PREALBUMIN 13.7 (L) 08/07/2021      Latest Ref Rng & Units 05/03/2022    4:36 AM 04/30/2022    6:31 AM 04/29/2022   10:40 PM  CBC EXTENDED  WBC 4.0 - 10.5 K/uL 4.9  8.9  9.0   RBC 3.87 - 5.11 MIL/uL 3.66  3.65  3.86   Hemoglobin 12.0 - 15.0 g/dL 10.5  10.5  11.2   HCT 36.0 - 46.0 % 34.7  34.9  36.5   Platelets 150 - 400 K/uL 146  145  157   NEUT# 1.7 - 7.7 K/uL   7.5   Lymph# 0.7 - 4.0 K/uL   0.9      There is no height or weight on file to calculate BMI.  Orders:  No orders of the defined types were placed in this encounter.  No orders of the defined types were placed in this encounter.    Procedures: No procedures performed  Clinical Data: No additional findings.  ROS:  All other systems negative, except as noted in the HPI. Review of Systems  Objective: Vital Signs: LMP 08/22/2015 (Approximate)   Specialty Comments:  No specialty comments available.  PMFS History: Patient Active Problem List   Diagnosis Date Noted   GERD  (gastroesophageal reflux disease) 04/30/2022   Bacteremia 04/29/2022   Non-adherence to medical treatment 03/16/2022   Cutaneous abscess of right foot    Osteomyelitis of foot ([place]) 02/14/2022   Obesity (BMI 30-39.9) 01/26/2022   Ischemic ulcer of right foot ([place]) 01/24/2022   Normocytic anemia 01/24/2022   Sacral pressure ulcer 19/62/2297   Metabolic acidosis, increased anion gap 01/24/2022   Leg wound, left, sequela 12/10/2021   Open leg wound 09/03/2021   Wound infection    Non-pressure chronic ulcer of right calf limited to breakdown of skin ([place])    Calciphylaxis of right lower extremity with nonhealing ulcer, limited to breakdown of skin ([place])    Chronic ulcer of left thigh ([place]) 08/07/2021   Calciphylaxis of left lower extremity with nonhealing ulcer with necrosis of muscle ([place]) 08/07/2021   Lower GI bleed 05/12/2021   Acute GI bleeding 04/24/2021   Acute blood loss anemia 04/23/2021   GI bleed 04/22/2021   Fever 03/31/2021   Acute on chronic heart failure with preserved ejection fraction (HFpEF) ([place]) 03/30/2021   End stage renal disease ([place]) 11/16/2020   Calciphylaxis 11/06/2020   Non-healing open wound of heel 11/03/2020   Diabetic foot infection ([place]) 11/01/2020   Decubitus ulcer, heel 11/01/2020   Closed nondisplaced fracture of left patella 98/92/1194   Metabolic acidosis 17/40/8144   Acute on chronic renal failure ([place]) 06/10/2020   Acute pericardial effusion 06/10/2020   Chronic kidney disease, stage 4 (severe) ([place]) 03/05/2019   Vitamin D deficiency 01/28/2019   Type 2 diabetes mellitus with ESRD (end-stage renal disease) ([place]) 09/21/2015   Mixed hyperlipidemia 09/21/2015   Essential hypertension 09/21/2015   Acquired hypothyroidism 09/21/2015   Iris bomb 07/31/2012   Secondary angle-closure glaucoma 07/31/2012   Past Medical History:  Diagnosis Date   Anemia    Blindness of right eye with low vision in contralateral eye    s/p victrectomy   Diabetes  mellitus, type II ([place])    Dyslipidemia    Glaucoma    History of blood transfusion    Hypertension    Hypothyroidism (acquired)    Kidney disease    Stage 5   Pneumonia     Family History  Problem Relation Age of Onset   Heart failure Mother    Heart disease Mother    Diabetes Mother    Kidney disease Mother    Heart failure Father    Diabetes Father    Heart disease Father  Diabetes Brother    Heart failure Maternal Grandmother    Heart failure Maternal Grandfather    Transient ischemic attack Maternal Grandfather    Colon cancer Neg Hx     Past Surgical History:  Procedure Laterality Date   ABDOMINAL AORTOGRAM W/LOWER EXTREMITY Bilateral 12/18/2020   Procedure: ABDOMINAL AORTOGRAM W/LOWER EXTREMITY;  Surgeon: Elam Dutch, MD;  Location: [facility];  Service: Cardiovascular;  Laterality: Bilateral;   ABDOMINAL AORTOGRAM W/LOWER EXTREMITY Bilateral 01/25/2022   Procedure: ABDOMINAL AORTOGRAM W/LOWER EXTREMITY;  Surgeon: Serafina Mitchell, MD;  Location: [facility];  Service: Cardiovascular;  Laterality: Bilateral;   AMPUTATION Right 02/16/2022   Procedure: RIGHT FOOT 5TH RAY AMPUTATION;  Surgeon: Newt Minion, MD;  Location: [facility];  Service: Orthopedics;  Laterality: Right;   ANKLE FRACTURE SURGERY Right    AV FISTULA PLACEMENT Left 08/18/2020   Procedure: LEFT ARM BRACHIOCEPHALIC ARTERIOVENOUS (AV) FISTULA CREATION;  Surgeon: Elam Dutch, MD;  Location: [facility];  Service: Vascular;  Laterality: Left;   BIOPSY  04/24/2021   Procedure: BIOPSY;  Surgeon: Eloise Harman, DO;  [facility];  Service: Endoscopy;;   CESAREAN SECTION     CHOLECYSTECTOMY     COLONOSCOPY  04/24/2021   Surgeon: Eloise Harman, DO;  nonbleeding internal hemorrhoids, 1 large (25 mm) pedunculated transverse colon polyp (prolapse type polyp) with adherent clot and stigmata of recent bleed.   COLONOSCOPY WITH PROPOFOL N/A 05/14/2021   Procedure: COLONOSCOPY WITH  PROPOFOL;  Surgeon: Daneil Dolin, MD;  [facility];  Service: Endoscopy;  Laterality: N/A;   ESOPHAGOGASTRODUODENOSCOPY (EGD) WITH PROPOFOL N/A 04/24/2021   Surgeon: Eloise Harman, DO;  duodenal erosions and gastritis biopsied (pathology with peptic duodenitis, reactive gastropathy with erosions/chronic inflammation, negative for H. pylori)   EYE SURGERY     Vatrectomy   HEMOSTASIS CLIP PLACEMENT  05/14/2021   Procedure: HEMOSTASIS CLIP PLACEMENT;  Surgeon: Daneil Dolin, MD;  [facility];  Service: Endoscopy;;   IR PERC TUN PERIT CATH WO PORT S&I /IMAG  09/15/2020   IR REMOVAL TUN CV CATH W/O FL  02/19/2021   IR US GUIDE VASC ACCESS RIGHT  09/15/2020   POLYPECTOMY  04/24/2021   Procedure: POLYPECTOMY;  Surgeon: Eloise Harman, DO;  [facility];  Service: Endoscopy;;   POLYPECTOMY  05/14/2021   Procedure: POLYPECTOMY;  Surgeon: Daneil Dolin, MD;  [facility];  Service: Endoscopy;;   SKIN SPLIT GRAFT Bilateral 09/03/2021   Procedure: SKIN GRAFT BILATERAL LEGS;  Surgeon: Newt Minion, MD;  Location: [facility];  Service: Orthopedics;  Laterality: Bilateral;   SKIN SPLIT GRAFT Left 12/10/2021   Procedure: IRRIGATION AND DEBRIDEMENT LEFT CALF, APPLICATION SPLIT THICKNESS SKIN GRAFT;  Surgeon: Newt Minion, MD;  Location: [facility];  Service: Orthopedics;  Laterality: Left;   TOE SURGERY     Social History   Occupational History   Not on file  Tobacco Use   Smoking status: Never   Smokeless tobacco: Never  Vaping Use   Vaping Use: Never used  Substance and Sexual Activity   Alcohol use: No   Drug use: No   Sexual activity: Yes    Birth control/protection: Condom

## 2022-07-11 ENCOUNTER — Ambulatory Visit (HOSPITAL_COMMUNITY): Payer: Medicare Other

## 2022-07-15 ENCOUNTER — Encounter (HOSPITAL_BASED_OUTPATIENT_CLINIC_OR_DEPARTMENT_OTHER): Payer: Self-pay

## 2022-07-20 ENCOUNTER — Encounter (HOSPITAL_COMMUNITY): Payer: Self-pay | Admitting: *Deleted

## 2022-07-22 ENCOUNTER — Ambulatory Visit: Payer: Medicare Other | Admitting: Family

## 2022-07-26 ENCOUNTER — Encounter: Payer: Self-pay | Admitting: Nutrition

## 2022-07-26 NOTE — Patient Instructions (Signed)
Eat three meals per day following renal restrictions Take insulin and other medications as prescribed. Drink only water-stay in your fluid allowance. Cut out processed junk food. Choose foods that come from a garden that you can have-plant based fruits, vegetables and grains that you can have. Get A1C down to 7%.

## 2022-07-27 ENCOUNTER — Ambulatory Visit (HOSPITAL_BASED_OUTPATIENT_CLINIC_OR_DEPARTMENT_OTHER): Payer: Medicare Other

## 2022-07-27 ENCOUNTER — Ambulatory Visit (HOSPITAL_COMMUNITY): Payer: Medicare Other | Attending: Cardiovascular Disease

## 2022-07-27 DIAGNOSIS — I5042 Chronic combined systolic (congestive) and diastolic (congestive) heart failure: Secondary | ICD-10-CM | POA: Insufficient documentation

## 2022-07-27 LAB — MYOCARDIAL PERFUSION IMAGING
LV dias vol: 109 mL (ref 46–106)
LV sys vol: 44 mL
Nuc Stress EF: 60 %
Peak HR: 75 {beats}/min
Rest HR: 72 {beats}/min
Rest Nuclear Isotope Dose: 10.8 mCi
SDS: 0
SRS: 12
SSS: 12
ST Depression (mm): 0 mm
Stress Nuclear Isotope Dose: 31.6 mCi
TID: 1.01

## 2022-07-27 LAB — ECHOCARDIOGRAM COMPLETE
Area-P 1/2: 3.28 cm2
Height: 66 in
S' Lateral: 4.25 cm
Weight: 3280 oz

## 2022-07-27 MED ORDER — TECHNETIUM TC 99M TETROFOSMIN IV KIT
10.8000 | PACK | Freq: Once | INTRAVENOUS | Status: AC | PRN
  Administered 2022-07-27: 10.8 via INTRAVENOUS

## 2022-07-27 MED ORDER — AMINOPHYLLINE 25 MG/ML IV SOLN
75.0000 mg | Freq: Once | INTRAVENOUS | Status: AC
  Administered 2022-07-27: 75 mg via INTRAVENOUS

## 2022-07-27 MED ORDER — TECHNETIUM TC 99M TETROFOSMIN IV KIT
31.6000 | PACK | Freq: Once | INTRAVENOUS | Status: AC | PRN
  Administered 2022-07-27: 31.6 via INTRAVENOUS

## 2022-07-27 MED ORDER — REGADENOSON 0.4 MG/5ML IV SOLN
0.4000 mg | Freq: Once | INTRAVENOUS | Status: AC
  Administered 2022-07-27: 0.4 mg via INTRAVENOUS

## 2022-07-29 ENCOUNTER — Telehealth: Payer: Self-pay | Admitting: Family

## 2022-07-29 NOTE — Telephone Encounter (Signed)
Patient called asked if she can be added to Erin's schedule on 08/05/2022 around 10 or 10:15 am? The number to contact patient is [phone]

## 2022-07-29 NOTE — Telephone Encounter (Signed)
Scheduled at 10, pt informed.

## 2022-08-05 ENCOUNTER — Ambulatory Visit (INDEPENDENT_AMBULATORY_CARE_PROVIDER_SITE_OTHER): Payer: Medicare Other | Admitting: Family

## 2022-08-05 ENCOUNTER — Telehealth (HOSPITAL_BASED_OUTPATIENT_CLINIC_OR_DEPARTMENT_OTHER): Payer: Self-pay | Admitting: *Deleted

## 2022-08-05 DIAGNOSIS — L98491 Non-pressure chronic ulcer of skin of other sites limited to breakdown of skin: Secondary | ICD-10-CM

## 2022-08-05 DIAGNOSIS — Z89421 Acquired absence of other right toe(s): Secondary | ICD-10-CM | POA: Diagnosis not present

## 2022-08-05 DIAGNOSIS — L97911 Non-pressure chronic ulcer of unspecified part of right lower leg limited to breakdown of skin: Secondary | ICD-10-CM | POA: Diagnosis not present

## 2022-08-05 DIAGNOSIS — R7881 Bacteremia: Secondary | ICD-10-CM

## 2022-08-05 DIAGNOSIS — L97923 Non-pressure chronic ulcer of unspecified part of left lower leg with necrosis of muscle: Secondary | ICD-10-CM

## 2022-08-05 DIAGNOSIS — I2721 Secondary pulmonary arterial hypertension: Secondary | ICD-10-CM

## 2022-08-05 NOTE — Telephone Encounter (Signed)
-----   Message from Skeet Latch, MD sent at 08/04/2022  6:12 PM EDT ----- Echo shows that her heart is squeezing almost normally and better than before.  The pressures in her lungs is still high.  She also has moderate to severe leaking of the mitral valve.  We need to get a V/Q scan to rule out blood clots in her lungs as the cause for her elevated lung pressure.   Also can we get them to draw some blood cultures at dialysis?

## 2022-08-05 NOTE — Telephone Encounter (Signed)
Spoke with patient regarding th Wednesday 08/10/22 9:30 am VQ scan appointment at [facility]---arrival time is 9:15 am --1st floor admissions office for check in---chest x-ray will be done prior to starting the VQ scan at 10:00 am---patient voiced her understanding

## 2022-08-05 NOTE — Telephone Encounter (Signed)
Left message to call back  

## 2022-08-08 NOTE — Telephone Encounter (Signed)
Lab orders faxed to [name] dialysis center in [place], confirmed received  Fax # [phone] phone [phone]

## 2022-08-09 ENCOUNTER — Encounter: Payer: Self-pay | Admitting: Family

## 2022-08-09 NOTE — Progress Notes (Addendum)
Office Visit Note   Patient: Amy Moses           Date of Birth: 1971/03/30           MRN: [id number] Visit Date: 08/05/2022              Requested by: Emelda Fear, [place] [address] PCP: Emelda Fear, DO  Chief Complaint  Patient presents with   Left Leg - Follow-up   Right Leg - Follow-up      HPI: The patient is a 51 year old woman who is seen today in routine follow-up she has had ray amputation of the right foot as well as bilateral posterior calf ulcers due to calciphylaxis.  She has been having Dynaflex compression wraps bilaterally with the assistance of home health.  This has been changed once a week.  She was last seen in the office about 4 weeks ago  She continues to need significant assistance with her ADLs at home.  She has been using a bedside commode.  She is using a wheelchair for mobility in the home.  She has had significant generalized weakness and deconditioning.  Assessment & Plan: Visit Diagnoses: No diagnosis found.  Plan: Quite pleased with the improvement in her ray amputation good control of the edema on the left lower extremity with compression wrapping all ulcers nearly healed. we will continue with current therapy of once weekly wound cleansing with dial soap and water. Apply unna and dynaflex compression wraps bilaterally.  Hope to move to compression garments at next   May weight-bear as tolerated.  PT eval and treat.  Follow-Up Instructions: No follow-ups on file.   Ortho Exam  Patient is alert, oriented, no adenopathy, well-dressed, normal affect, normal respiratory effort. On examination of the left lower extremity there is controlled edema from the area of the wrap distally with improvement in her skin much  less lichenification.  The wound is nearly healed.  Please see attached photo. No active drainage today no erythema no warmth   to the right lower extremity.  Again control of the edema from the  compression wrapping from the area of the wrap distally.  The calf ulcer is well-healed.  The ray amputation site ulcer is about 5 mm in diameter. this is filled in with the 100% fibrinous exudate tissue this does not probe there is no surrounding drainage erythema warmth   Imaging: No results found. No images are attached to the encounter.  Labs: Lab Results  Component Value Date   HGBA1C 11.5 (H) 02/15/2022   HGBA1C 13.0 12/30/2021   HGBA1C 12.2 (H) 12/10/2021   ESRSEDRATE 34 (H) 02/14/2022   ESRSEDRATE 40 (H) 08/07/2021   CRP 7.0 (H) 02/14/2022   CRP 11.1 (H) 08/07/2021   REPTSTATUS 05/02/2022 FINAL 04/29/2022   GRAMSTAIN  07/08/2021    RARE WBC PRESENT,BOTH PMN AND MONONUCLEAR RARE GRAM POSITIVE RODS RARE GRAM POSITIVE COCCI IN PAIRS    CULT (A) 04/29/2022    CITROBACTER KOSERI SUSCEPTIBILITIES PERFORMED ON PREVIOUS CULTURE WITHIN THE LAST 5 DAYS. Performed at [facility], [place] [address] [place], [place] [address]    LABORGA CITROBACTER KOSERI 04/28/2022     Lab Results  Component Value Date   ALBUMIN 2.9 (L) 05/03/2022   ALBUMIN 3.1 (L) 05/02/2022   ALBUMIN 3.3 (L) 04/30/2022   PREALBUMIN 13.7 (L) 08/07/2021    Lab Results  Component Value Date   MG 2.1 04/30/2022   MG 2.1 05/14/2021  MG 2.8 (H) 05/11/2021   Lab Results  Component Value Date   VD25OH 36.9 12/30/2021    Lab Results  Component Value Date   PREALBUMIN 13.7 (L) 08/07/2021      Latest Ref Rng & Units 05/03/2022    4:36 AM 04/30/2022    6:31 AM 04/29/2022   10:40 PM  CBC EXTENDED  WBC 4.0 - 10.5 K/uL 4.9  8.9  9.0   RBC 3.87 - 5.11 MIL/uL 3.66  3.65  3.86   Hemoglobin 12.0 - 15.0 g/dL 10.5  10.5  11.2   HCT 36.0 - 46.0 % 34.7  34.9  36.5   Platelets 150 - 400 K/uL 146  145  157   NEUT# 1.7 - 7.7 K/uL   7.5   Lymph# 0.7 - 4.0 K/uL   0.9      There is no height or weight on file to calculate BMI.  Orders:  No orders of the defined types were placed in this encounter.  No  orders of the defined types were placed in this encounter.    Procedures: No procedures performed  Clinical Data: No additional findings.  ROS:  All other systems negative, except as noted in the HPI. Review of Systems  Objective: Vital Signs: LMP 08/22/2015 (Approximate)   Specialty Comments:  No specialty comments available.  PMFS History: Patient Active Problem List   Diagnosis Date Noted   GERD (gastroesophageal reflux disease) 04/30/2022   Bacteremia 04/29/2022   Non-adherence to medical treatment 03/16/2022   Cutaneous abscess of right foot    Osteomyelitis of foot ([place]) 02/14/2022   Obesity (BMI 30-39.9) 01/26/2022   Ischemic ulcer of right foot ([place]) 01/24/2022   Normocytic anemia 01/24/2022   Sacral pressure ulcer 50/93/2671   Metabolic acidosis, increased anion gap 01/24/2022   Leg wound, left, sequela 12/10/2021   Open leg wound 09/03/2021   Wound infection    Non-pressure chronic ulcer of right calf limited to breakdown of skin ([place])    Calciphylaxis of right lower extremity with nonhealing ulcer, limited to breakdown of skin ([place])    Chronic ulcer of left thigh ([place]) 08/07/2021   Calciphylaxis of left lower extremity with nonhealing ulcer with necrosis of muscle ([place]) 08/07/2021   Lower GI bleed 05/12/2021   Acute GI bleeding 04/24/2021   Acute blood loss anemia 04/23/2021   GI bleed 04/22/2021   Fever 03/31/2021   Acute on chronic heart failure with preserved ejection fraction (HFpEF) ([place]) 03/30/2021   End stage renal disease ([place]) 11/16/2020   Calciphylaxis 11/06/2020   Non-healing open wound of heel 11/03/2020   Diabetic foot infection ([place]) 11/01/2020   Decubitus ulcer, heel 11/01/2020   Closed nondisplaced fracture of left patella 24/58/0998   Metabolic acidosis 33/82/5053   Acute on chronic renal failure ([place]) 06/10/2020   Acute pericardial effusion 06/10/2020   Chronic kidney disease, stage 4 (severe) ([place]) 03/05/2019   Vitamin D deficiency  01/28/2019   Type 2 diabetes mellitus with ESRD (end-stage renal disease) ([place]) 09/21/2015   Mixed hyperlipidemia 09/21/2015   Essential hypertension 09/21/2015   Acquired hypothyroidism 09/21/2015   Iris bomb 07/31/2012   Secondary angle-closure glaucoma 07/31/2012   Past Medical History:  Diagnosis Date   Anemia    Blindness of right eye with low vision in contralateral eye    s/p victrectomy   Diabetes mellitus, type II ([place])    Dyslipidemia    Glaucoma    History of blood transfusion    Hypertension  Hypothyroidism (acquired)    Kidney disease    Stage 5   Pneumonia     Family History  Problem Relation Age of Onset   Heart failure Mother    Heart disease Mother    Diabetes Mother    Kidney disease Mother    Heart failure Father    Diabetes Father    Heart disease Father    Diabetes Brother    Heart failure Maternal Grandmother    Heart failure Maternal Grandfather    Transient ischemic attack Maternal Grandfather    Colon cancer Neg Hx     Past Surgical History:  Procedure Laterality Date   ABDOMINAL AORTOGRAM W/LOWER EXTREMITY Bilateral 12/18/2020   Procedure: ABDOMINAL AORTOGRAM W/LOWER EXTREMITY;  Surgeon: Elam Dutch, MD;  Location: [facility];  Service: Cardiovascular;  Laterality: Bilateral;   ABDOMINAL AORTOGRAM W/LOWER EXTREMITY Bilateral 01/25/2022   Procedure: ABDOMINAL AORTOGRAM W/LOWER EXTREMITY;  Surgeon: Serafina Mitchell, MD;  Location: [facility];  Service: Cardiovascular;  Laterality: Bilateral;   AMPUTATION Right 02/16/2022   Procedure: RIGHT FOOT 5TH RAY AMPUTATION;  Surgeon: Newt Minion, MD;  Location: [facility];  Service: Orthopedics;  Laterality: Right;   ANKLE FRACTURE SURGERY Right    AV FISTULA PLACEMENT Left 08/18/2020   Procedure: LEFT ARM BRACHIOCEPHALIC ARTERIOVENOUS (AV) FISTULA CREATION;  Surgeon: Elam Dutch, MD;  Location: [facility];  Service: Vascular;  Laterality: Left;   BIOPSY  04/24/2021   Procedure:  BIOPSY;  Surgeon: Eloise Harman, DO;  [facility];  Service: Endoscopy;;   CESAREAN SECTION     CHOLECYSTECTOMY     COLONOSCOPY  04/24/2021   Surgeon: Eloise Harman, DO;  nonbleeding internal hemorrhoids, 1 large (25 mm) pedunculated transverse colon polyp (prolapse type polyp) with adherent clot and stigmata of recent bleed.   COLONOSCOPY WITH PROPOFOL N/A 05/14/2021   Procedure: COLONOSCOPY WITH PROPOFOL;  Surgeon: Daneil Dolin, MD;  [facility];  Service: Endoscopy;  Laterality: N/A;   ESOPHAGOGASTRODUODENOSCOPY (EGD) WITH PROPOFOL N/A 04/24/2021   Surgeon: Eloise Harman, DO;  duodenal erosions and gastritis biopsied (pathology with peptic duodenitis, reactive gastropathy with erosions/chronic inflammation, negative for H. pylori)   EYE SURGERY     Vatrectomy   HEMOSTASIS CLIP PLACEMENT  05/14/2021   Procedure: HEMOSTASIS CLIP PLACEMENT;  Surgeon: Daneil Dolin, MD;  [facility];  Service: Endoscopy;;   IR PERC TUN PERIT CATH WO PORT S&I /IMAG  09/15/2020   IR REMOVAL TUN CV CATH W/O FL  02/19/2021   IR US GUIDE VASC ACCESS RIGHT  09/15/2020   POLYPECTOMY  04/24/2021   Procedure: POLYPECTOMY;  Surgeon: Eloise Harman, DO;  [facility];  Service: Endoscopy;;   POLYPECTOMY  05/14/2021   Procedure: POLYPECTOMY;  Surgeon: Daneil Dolin, MD;  [facility];  Service: Endoscopy;;   SKIN SPLIT GRAFT Bilateral 09/03/2021   Procedure: SKIN GRAFT BILATERAL LEGS;  Surgeon: Newt Minion, MD;  Location: [facility];  Service: Orthopedics;  Laterality: Bilateral;   SKIN SPLIT GRAFT Left 12/10/2021   Procedure: IRRIGATION AND DEBRIDEMENT LEFT CALF, APPLICATION SPLIT THICKNESS SKIN GRAFT;  Surgeon: Newt Minion, MD;  Location: [facility];  Service: Orthopedics;  Laterality: Left;   TOE SURGERY     Social History   Occupational History   Not on file  Tobacco Use   Smoking status: Never   Smokeless tobacco: Never  Vaping Use  Vaping Use: Never used  Substance and Sexual Activity   Alcohol use: No   Drug use: No   Sexual activity: Yes    Birth control/protection: Condom

## 2022-08-10 ENCOUNTER — Encounter (HOSPITAL_COMMUNITY)
Admission: RE | Admit: 2022-08-10 | Discharge: 2022-08-10 | Disposition: A | Discharge status: Home / Self Care | Payer: Medicare Other | Source: Ambulatory Visit | Attending: Cardiovascular Disease | Admitting: Cardiovascular Disease

## 2022-08-10 ENCOUNTER — Ambulatory Visit (HOSPITAL_COMMUNITY)
Admission: RE | Admit: 2022-08-10 | Discharge: 2022-08-10 | Disposition: A | Discharge status: Home / Self Care | Payer: Medicare Other | Source: Ambulatory Visit | Attending: Cardiovascular Disease | Admitting: Cardiovascular Disease

## 2022-08-10 DIAGNOSIS — I2721 Secondary pulmonary arterial hypertension: Secondary | ICD-10-CM | POA: Insufficient documentation

## 2022-08-10 MED ORDER — TECHNETIUM TO 99M ALBUMIN AGGREGATED
4.0000 | Freq: Once | INTRAVENOUS | Status: AC | PRN
Start: 2022-08-10 — End: 2022-08-10
  Administered 2022-08-10: 4 via INTRAVENOUS

## 2022-08-11 NOTE — Telephone Encounter (Signed)
Late entry- patient aware of results and faxed orders to Dialysis Confirmed receiving

## 2022-08-18 NOTE — Telephone Encounter (Addendum)
Spoke with Melissa RN with [facility]  Cultures done 9/19, no growth  Will fax results   Mychart message sent to patient

## 2022-08-19 ENCOUNTER — Telehealth: Payer: Self-pay | Admitting: Orthopedic Surgery

## 2022-08-19 ENCOUNTER — Telehealth: Payer: Self-pay | Admitting: Family

## 2022-08-19 NOTE — Telephone Encounter (Signed)
Received call from [facility] w/ [facility]. She is inquiring if Junie Panning could do an addendum on the 05/20/22 ov note to include patients weight bearing status, the fact that she uses bedside commode and wheelchair and that she needs assistance with all ADL's. She stated Dr. [name] needs to co-sign the addendum. callback [phone]

## 2022-08-19 NOTE — Telephone Encounter (Signed)
Heather Clinical Case Manager called from [facility] called requesting orders for wound care. Nira Conn states pt told them PA Erin/ Dr [name] wants wound changed once a wk and not every other wk. Please fax orders to [phone]. Heather phone number is [phone].

## 2022-08-19 NOTE — Telephone Encounter (Signed)
Sending to [facility] for review of what is needed on 05/20/22 note.

## 2022-08-19 NOTE — Telephone Encounter (Signed)
Faxed to [facility] at [employer]

## 2022-08-26 ENCOUNTER — Ambulatory Visit (HOSPITAL_BASED_OUTPATIENT_CLINIC_OR_DEPARTMENT_OTHER): Payer: Medicare Other | Admitting: Nurse Practitioner

## 2022-08-26 NOTE — Progress Notes (Deleted)
Cardiology Office Note:    Date:  08/26/2022   ID:  Amy Moses, DOB [birth year], MRN [id number]  PCP:  Emelda Fear, [place] Providers Cardiologist:  None { Click to update primary MD,subspecialty MD or APP then REFRESH:1}    Referring MD: Emelda Fear, DO   No chief complaint on file. ***  History of Present Illness:    Amy Moses is a 51 y.o. female with a hx of ***  HFmrEF -> HFpEF HTN HLD T2DM ESRD on HD Hypothyroidism R eye blindness Calciphylaxis of lower extremities History of leg/foot wounds, diabetic foot infection, and decubitus ulcer Acute pericardial effusion History of GI bleed Glaucoma Obesity   Was previously admitted to [facility] in June 2023 for bacteremia and received antibiotics.  Had nonhealing lower extremity wounds due to calciphylaxis, followed by VVS and orthopedics.  Echocardiogram revealed LVEF 45 to 85%, grade 2 diastolic dysfunction and global hypokinesis, PASP 54 mmHg.  Trivial pericardial effusion was also noted.  She was recommended to follow-up with cardiology.  Last seen by Dr. Skeet Latch on June 24, 2022 and was accompanied by her daughter.  Stated previously she had not had any cardiac issues or history.  Did admit to orthopnea and used couple pillows at night and did this prior to starting dialysis.  Started dialysis a year and a half ago and admitted to BLE that improved when starting dialysis.  Stopped some of her blood pressure medicines as blood pressure became more well controlled.  Repeat echocardiogram revealed EF 50 to 55%, RV SF mildly reduced, estimated RVSP 64.2 mmHg.  Moderate to severe MR, TR severe, calcification of aortic valve, dilated IVC, right atrial pressure 8 mmHg, small to moderate circumferential pericardial effusion without RV diastolic collapse or RA inversion.  VQ scan was normal did not reveal PE.  Lexiscan Myoview was low risk, possible finding of pulmonary hypertension,  overall normal result.  Was told to return in 2 months for cardiology follow-up.  Today she presents for follow-up and she states.  OSA???   HFpEF HTN HLD Acute pericardial effusion Calciphylaxis ESRD Obesity   Past Medical History:  Diagnosis Date   Anemia    Blindness of right eye with low vision in contralateral eye    s/p victrectomy   Diabetes mellitus, type II ([place])    Dyslipidemia    Glaucoma    History of blood transfusion    Hypertension    Hypothyroidism (acquired)    Kidney disease    Stage 5   Pneumonia     Past Surgical History:  Procedure Laterality Date   ABDOMINAL AORTOGRAM W/LOWER EXTREMITY Bilateral 12/18/2020   Procedure: ABDOMINAL AORTOGRAM W/LOWER EXTREMITY;  Surgeon: Elam Dutch, MD;  Location: [place] [facility];  Service: Cardiovascular;  Laterality: Bilateral;   ABDOMINAL AORTOGRAM W/LOWER EXTREMITY Bilateral 01/25/2022   Procedure: ABDOMINAL AORTOGRAM W/LOWER EXTREMITY;  Surgeon: Serafina Mitchell, MD;  Location: [place] [facility];  Service: Cardiovascular;  Laterality: Bilateral;   AMPUTATION Right 02/16/2022   Procedure: RIGHT FOOT 5TH RAY AMPUTATION;  Surgeon: Newt Minion, MD;  Location: [place];  Service: Orthopedics;  Laterality: Right;   ANKLE FRACTURE SURGERY Right    AV FISTULA PLACEMENT Left 08/18/2020   Procedure: LEFT ARM BRACHIOCEPHALIC ARTERIOVENOUS (AV) FISTULA CREATION;  Surgeon: Elam Dutch, MD;  Location: [name];  Service: Vascular;  Laterality: Left;   BIOPSY  04/24/2021   Procedure: BIOPSY;  Surgeon: Eloise Harman, DO;  [facility];  Service: Endoscopy;;   CESAREAN SECTION     CHOLECYSTECTOMY     COLONOSCOPY  04/24/2021   Surgeon: Eloise Harman, DO;  nonbleeding internal hemorrhoids, 1 large (25 mm) pedunculated transverse colon polyp (prolapse type polyp) with adherent clot and stigmata of recent bleed.   COLONOSCOPY WITH PROPOFOL N/A 05/14/2021   Procedure: COLONOSCOPY WITH PROPOFOL;   Surgeon: Daneil Dolin, MD;  [facility];  Service: Endoscopy;  Laterality: N/A;   ESOPHAGOGASTRODUODENOSCOPY (EGD) WITH PROPOFOL N/A 04/24/2021   Surgeon: Eloise Harman, DO;  duodenal erosions and gastritis biopsied (pathology with peptic duodenitis, reactive gastropathy with erosions/chronic inflammation, negative for H. pylori)   EYE SURGERY     Vatrectomy   HEMOSTASIS CLIP PLACEMENT  05/14/2021   Procedure: HEMOSTASIS CLIP PLACEMENT;  Surgeon: Daneil Dolin, MD;  [facility];  Service: Endoscopy;;   IR PERC TUN PERIT CATH WO PORT S&I /IMAG  09/15/2020   IR REMOVAL TUN CV CATH W/O FL  02/19/2021   IR US GUIDE VASC ACCESS RIGHT  09/15/2020   POLYPECTOMY  04/24/2021   Procedure: POLYPECTOMY;  Surgeon: Eloise Harman, DO;  [facility];  Service: Endoscopy;;   POLYPECTOMY  05/14/2021   Procedure: POLYPECTOMY;  Surgeon: Daneil Dolin, MD;  [facility];  Service: Endoscopy;;   SKIN SPLIT GRAFT Bilateral 09/03/2021   Procedure: SKIN GRAFT BILATERAL LEGS;  Surgeon: Newt Minion, MD;  Location: [place];  Service: Orthopedics;  Laterality: Bilateral;   SKIN SPLIT GRAFT Left 12/10/2021   Procedure: IRRIGATION AND DEBRIDEMENT LEFT CALF, APPLICATION SPLIT THICKNESS SKIN GRAFT;  Surgeon: Newt Minion, MD;  Location: [place];  Service: Orthopedics;  Laterality: Left;   TOE SURGERY      Current Medications: No outpatient medications have been marked as taking for the 08/26/22 encounter (Appointment) with Loel Dubonnet, NP.     Allergies:   Ace inhibitors   Social History   Socioeconomic History   Marital status: Single    Spouse name: Not on file   Number of children: Not on file   Years of education: Not on file   Highest education level: Not on file  Occupational History   Not on file  Tobacco Use   Smoking status: Never   Smokeless tobacco: Never  Vaping Use   Vaping Use: Never used  Substance and Sexual Activity    Alcohol use: No   Drug use: No   Sexual activity: Yes    Birth control/protection: Condom  Other Topics Concern   Not on file  Social History Narrative   Not on file   Social Determinants of Health   Financial Resource Strain: Not on file  Food Insecurity: Not on file  Transportation Needs: Not on file  Physical Activity: Not on file  Stress: Not on file  Social Connections: Not on file     Family History: The patient's ***family history includes Diabetes in her brother, father, and mother; Heart disease in her father and mother; Heart failure in her father, maternal grandfather, maternal grandmother, and mother; Kidney disease in her mother; Transient ischemic attack in her maternal grandfather. There is no history of Colon cancer.  ROS:   Please see the history of present illness.    *** All other systems reviewed and are negative.  EKGs/Labs/Other Studies Reviewed:    The following studies were reviewed today: ***  EKG:  EKG is *** ordered today.  The ekg ordered today demonstrates ***  Recent Labs: 01/24/2022: TSH 3.109 04/30/2022: ALT 13; Magnesium 2.1 05/03/2022: BUN 55; Creatinine, Ser 7.16; Hemoglobin 10.5; Platelets 146; Potassium 5.0; Sodium 135  Recent Lipid Panel    Component Value Date/Time   CHOL 85 01/26/2022 0555   TRIG 98 01/26/2022 0555   HDL 25 (L) 01/26/2022 0555   CHOLHDL 3.4 01/26/2022 0555   VLDL 20 01/26/2022 0555   LDLCALC 40 01/26/2022 0555     Risk Assessment/Calculations:   {Does this patient have ATRIAL FIBRILLATION?:[phone]}  No BP recorded.  {Refresh Note OR Click here to enter BP  :1}***         Physical Exam:    VS:  LMP 08/22/2015 (Approximate)     Wt Readings from Last 3 Encounters:  07/27/22 205 lb (93 kg)  06/24/22 205 lb (93 kg)  05/03/22 203 lb 14.8 oz (92.5 kg)     GEN: *** Well nourished, well developed in no acute distress HEENT: Normal NECK: No JVD; No carotid bruits LYMPHATICS: No lymphadenopathy CARDIAC:  ***RRR, no murmurs, rubs, gallops RESPIRATORY:  Clear to auscultation without rales, wheezing or rhonchi  ABDOMEN: Soft, non-tender, non-distended MUSCULOSKELETAL:  No edema; No deformity  SKIN: Warm and dry NEUROLOGIC:  Alert and oriented x 3 PSYCHIATRIC:  Normal affect   ASSESSMENT:    No diagnosis found. PLAN:    In order of problems listed above:  ***      {Are you ordering a CV Procedure (e.g. stress test, cath, DCCV, TEE, etc)?   Press F2        :[id number]}    Medication Adjustments/Labs and Tests Ordered: Current medicines are reviewed at length with the patient today.  Concerns regarding medicines are outlined above.  No orders of the defined types were placed in this encounter.  No orders of the defined types were placed in this encounter.   There are no Patient Instructions on file for this visit.   Signed, Finis Bud, NP  08/26/2022 5:22 AM    [place]

## 2022-08-31 ENCOUNTER — Ambulatory Visit (INDEPENDENT_AMBULATORY_CARE_PROVIDER_SITE_OTHER): Payer: Medicare Other | Admitting: Family

## 2022-08-31 ENCOUNTER — Encounter: Payer: Self-pay | Admitting: Family

## 2022-08-31 DIAGNOSIS — I890 Lymphedema, not elsewhere classified: Secondary | ICD-10-CM | POA: Diagnosis not present

## 2022-08-31 DIAGNOSIS — Z89421 Acquired absence of other right toe(s): Secondary | ICD-10-CM

## 2022-08-31 DIAGNOSIS — L97923 Non-pressure chronic ulcer of unspecified part of left lower leg with necrosis of muscle: Secondary | ICD-10-CM

## 2022-08-31 DIAGNOSIS — L97911 Non-pressure chronic ulcer of unspecified part of right lower leg limited to breakdown of skin: Secondary | ICD-10-CM

## 2022-08-31 NOTE — Telephone Encounter (Signed)
Faxed OV note to [facility]

## 2022-08-31 NOTE — Progress Notes (Signed)
Office Visit Note   Patient: Amy Moses           Date of Birth: 20-Oct-1971           MRN: [id number] Visit Date: 08/31/2022              Requested by: Emelda Fear, [place] [address] PCP: Emelda Fear, DO  Chief Complaint  Patient presents with   Left Leg - Follow-up   Right Leg - Follow-up      HPI: The patient is a 51 year old woman who is seen today in follow-up for bilateral lower extremity venous and lymphatic edema with calciphylaxis ulcerations to posterior calves.  She is also had ray amputation on the right fifth ray.  For the last 4 weeks she has been having home health assistance for Dynaflex compression wraps.    She continues to need significant assistance with her ADLs at home.  She has been using a bedside commode.  She is using a wheelchair for mobility in the home.  She has had significant generalized weakness and deconditioning.  Despite continued use of compression exercise and elevating the lower extremities she has had persistent lower extremity lymphedema.  She has currently been using a basic pump. An N0272 "entre plus 1 week" basic pump was delivered to the patient on 06/27/22.  The patient was programmed to 30 mmHg pressure.  Unfortunately the basic pump has failed to decrease the edema in her thighs and abdomen  Assessment & Plan: Visit Diagnoses: No diagnosis found.  Plan: Quite pleased with the improvement in her ray amputation. good control of the edema on the left lower extremity with compression wrapping. we will continue with current therapy of once weekly wound cleansing with dial soap and water. Apply dynaflex compression wraps bilaterally.  May weight-bear as tolerated.  PT eval and treat.  Follow-Up Instructions: Return in about 4 weeks (around 09/28/2022).   Ortho Exam  Patient is alert, oriented, no adenopathy, well-dressed, normal affect, normal respiratory effort.  Truncal edema present. On examination of  the left lower extremity there is stable edema from the area of the wrap distally with improvement in her skin much less lichenification.  The wound has begun to break down. Wound is 6 cm x 2 cm. No depth. There is maceration. No active drainage today no erythema no warmth   To the right lower extremity.  Again control of the edema from the compression wrapping from the area of the wrap distally.  The calf ulcer as begun to break down as well this friable skin. There is ulcer measuring 3 cm x 1 cm. The ray amputation site ulcer is about 5 mm in diameter. this is filled in with the 100% fibrinous exudate tissue this does not probe there is no surrounding drainage erythema warmth  Measurements of the lower extremities as follows right ankle 22 right calf 34 right knee 55 cm right thigh 63 cm left ankle 25 cm calf 31 cm knee 51 cm a 61 cm  Imaging: No results found. No images are attached to the encounter.  Labs: Lab Results  Component Value Date   HGBA1C 11.5 (H) 02/15/2022   HGBA1C 13.0 12/30/2021   HGBA1C 12.2 (H) 12/10/2021   ESRSEDRATE 34 (H) 02/14/2022   ESRSEDRATE 40 (H) 08/07/2021   CRP 7.0 (H) 02/14/2022   CRP 11.1 (H) 08/07/2021   REPTSTATUS 05/02/2022 FINAL 04/29/2022   GRAMSTAIN  07/08/2021    RARE WBC PRESENT,BOTH PMN  AND MONONUCLEAR RARE GRAM POSITIVE RODS RARE GRAM POSITIVE COCCI IN PAIRS    CULT (A) 04/29/2022    CITROBACTER KOSERI SUSCEPTIBILITIES PERFORMED ON PREVIOUS CULTURE WITHIN THE LAST 5 DAYS. Performed at [facility], [place] [address], [place], [place] [address]    Doy Hutching CITROBACTER KOSERI 04/28/2022     Lab Results  Component Value Date   ALBUMIN 2.9 (L) 05/03/2022   ALBUMIN 3.1 (L) 05/02/2022   ALBUMIN 3.3 (L) 04/30/2022   PREALBUMIN 13.7 (L) 08/07/2021    Lab Results  Component Value Date   MG 2.1 04/30/2022   MG 2.1 05/14/2021   MG 2.8 (H) 05/11/2021   Lab Results  Component Value Date   VD25OH 36.9 12/30/2021    Lab Results   Component Value Date   PREALBUMIN 13.7 (L) 08/07/2021      Latest Ref Rng & Units 05/03/2022    4:36 AM 04/30/2022    6:31 AM 04/29/2022   10:40 PM  CBC EXTENDED  WBC 4.0 - 10.5 K/uL 4.9  8.9  9.0   RBC 3.87 - 5.11 MIL/uL 3.66  3.65  3.86   Hemoglobin 12.0 - 15.0 g/dL 10.5  10.5  11.2   HCT 36.0 - 46.0 % 34.7  34.9  36.5   Platelets 150 - 400 K/uL 146  145  157   NEUT# 1.7 - 7.7 K/uL   7.5   Lymph# 0.7 - 4.0 K/uL   0.9      There is no height or weight on file to calculate BMI.  Orders:  No orders of the defined types were placed in this encounter.  No orders of the defined types were placed in this encounter.    Procedures: No procedures performed  Clinical Data: No additional findings.  ROS:  All other systems negative, except as noted in the HPI. Review of Systems  Objective: Vital Signs: LMP 08/22/2015 (Approximate)   Specialty Comments:  No specialty comments available.  PMFS History: Patient Active Problem List   Diagnosis Date Noted   GERD (gastroesophageal reflux disease) 04/30/2022   Bacteremia 04/29/2022   Non-adherence to medical treatment 03/16/2022   Cutaneous abscess of right foot    Osteomyelitis of foot ([place]) 02/14/2022   Obesity (BMI 30-39.9) 01/26/2022   Ischemic ulcer of right foot ([place]) 01/24/2022   Normocytic anemia 01/24/2022   Sacral pressure ulcer 09/17/2535   Metabolic acidosis, increased anion gap 01/24/2022   Leg wound, left, sequela 12/10/2021   Open leg wound 09/03/2021   Wound infection    Non-pressure chronic ulcer of right calf limited to breakdown of skin ([place])    Calciphylaxis of right lower extremity with nonhealing ulcer, limited to breakdown of skin ([place])    Chronic ulcer of left thigh ([place]) 08/07/2021   Calciphylaxis of left lower extremity with nonhealing ulcer with necrosis of muscle ([place]) 08/07/2021   Lower GI bleed 05/12/2021   Acute GI bleeding 04/24/2021   Acute blood loss anemia 04/23/2021   GI bleed  04/22/2021   Fever 03/31/2021   Acute on chronic heart failure with preserved ejection fraction (HFpEF) ([place]) 03/30/2021   End stage renal disease ([place]) 11/16/2020   Calciphylaxis 11/06/2020   Non-healing open wound of heel 11/03/2020   Diabetic foot infection ([place]) 11/01/2020   Decubitus ulcer, heel 11/01/2020   Closed nondisplaced fracture of left patella 64/40/3474   Metabolic acidosis 25/95/6387   Acute on chronic renal failure ([place]) 06/10/2020   Acute pericardial effusion 06/10/2020   Chronic kidney  disease, stage 4 (severe) ([place]) 03/05/2019   Vitamin D deficiency 01/28/2019   Type 2 diabetes mellitus with ESRD (end-stage renal disease) ([place]) 09/21/2015   Mixed hyperlipidemia 09/21/2015   Essential hypertension 09/21/2015   Acquired hypothyroidism 09/21/2015   Iris bomb 07/31/2012   Secondary angle-closure glaucoma 07/31/2012   Past Medical History:  Diagnosis Date   Anemia    Blindness of right eye with low vision in contralateral eye    s/p victrectomy   Diabetes mellitus, type II ([place])    Dyslipidemia    Glaucoma    History of blood transfusion    Hypertension    Hypothyroidism (acquired)    Kidney disease    Stage 5   Pneumonia     Family History  Problem Relation Age of Onset   Heart failure Mother    Heart disease Mother    Diabetes Mother    Kidney disease Mother    Heart failure Father    Diabetes Father    Heart disease Father    Diabetes Brother    Heart failure Maternal Grandmother    Heart failure Maternal Grandfather    Transient ischemic attack Maternal Grandfather    Colon cancer Neg Hx     Past Surgical History:  Procedure Laterality Date   ABDOMINAL AORTOGRAM W/LOWER EXTREMITY Bilateral 12/18/2020   Procedure: ABDOMINAL AORTOGRAM W/LOWER EXTREMITY;  Surgeon: Elam Dutch, MD;  Location: [place] [place] [facility];  Service: Cardiovascular;  Laterality: Bilateral;   ABDOMINAL AORTOGRAM W/LOWER EXTREMITY Bilateral 01/25/2022   Procedure:  ABDOMINAL AORTOGRAM W/LOWER EXTREMITY;  Surgeon: Serafina Mitchell, MD;  Location: [place] [facility];  Service: Cardiovascular;  Laterality: Bilateral;   AMPUTATION Right 02/16/2022   Procedure: RIGHT FOOT 5TH RAY AMPUTATION;  Surgeon: Newt Minion, MD;  Location: [place];  Service: Orthopedics;  Laterality: Right;   ANKLE FRACTURE SURGERY Right    AV FISTULA PLACEMENT Left 08/18/2020   Procedure: LEFT ARM BRACHIOCEPHALIC ARTERIOVENOUS (AV) FISTULA CREATION;  Surgeon: Elam Dutch, MD;  Location: [place];  Service: Vascular;  Laterality: Left;   BIOPSY  04/24/2021   Procedure: BIOPSY;  Surgeon: Eloise Harman, DO;  [facility];  Service: Endoscopy;;   CESAREAN SECTION     CHOLECYSTECTOMY     COLONOSCOPY  04/24/2021   Surgeon: Eloise Harman, DO;  nonbleeding internal hemorrhoids, 1 large (25 mm) pedunculated transverse colon polyp (prolapse type polyp) with adherent clot and stigmata of recent bleed.   COLONOSCOPY WITH PROPOFOL N/A 05/14/2021   Procedure: COLONOSCOPY WITH PROPOFOL;  Surgeon: Daneil Dolin, MD;  [facility];  Service: Endoscopy;  Laterality: N/A;   ESOPHAGOGASTRODUODENOSCOPY (EGD) WITH PROPOFOL N/A 04/24/2021   Surgeon: Eloise Harman, DO;  duodenal erosions and gastritis biopsied (pathology with peptic duodenitis, reactive gastropathy with erosions/chronic inflammation, negative for H. pylori)   EYE SURGERY     Vatrectomy   HEMOSTASIS CLIP PLACEMENT  05/14/2021   Procedure: HEMOSTASIS CLIP PLACEMENT;  Surgeon: Daneil Dolin, MD;  [facility];  Service: Endoscopy;;   IR PERC TUN PERIT CATH WO PORT S&I /IMAG  09/15/2020   IR REMOVAL TUN CV CATH W/O FL  02/19/2021   IR US GUIDE VASC ACCESS RIGHT  09/15/2020   POLYPECTOMY  04/24/2021   Procedure: POLYPECTOMY;  Surgeon: Eloise Harman, DO;  [facility];  Service: Endoscopy;;   POLYPECTOMY  05/14/2021   Procedure: POLYPECTOMY;  Surgeon: Daneil Dolin, MD;   [facility];  Service: Endoscopy;;   SKIN SPLIT GRAFT Bilateral 09/03/2021   Procedure: SKIN GRAFT BILATERAL LEGS;  Surgeon: Newt Minion, MD;  Location: [place];  Service: Orthopedics;  Laterality: Bilateral;   SKIN SPLIT GRAFT Left 12/10/2021   Procedure: IRRIGATION AND DEBRIDEMENT LEFT CALF, APPLICATION SPLIT THICKNESS SKIN GRAFT;  Surgeon: Newt Minion, MD;  Location: [place];  Service: Orthopedics;  Laterality: Left;   TOE SURGERY     Social History   Occupational History   Not on file  Tobacco Use   Smoking status: Never   Smokeless tobacco: Never  Vaping Use   Vaping Use: Never used  Substance and Sexual Activity   Alcohol use: No   Drug use: No   Sexual activity: Yes    Birth control/protection: Condom

## 2022-09-02 ENCOUNTER — Ambulatory Visit: Payer: Medicare Other | Admitting: Family

## 2022-09-08 ENCOUNTER — Telehealth: Payer: Self-pay

## 2022-09-08 ENCOUNTER — Telehealth: Payer: Self-pay | Admitting: Orthopedic Surgery

## 2022-09-08 DIAGNOSIS — N186 End stage renal disease: Secondary | ICD-10-CM

## 2022-09-08 NOTE — Telephone Encounter (Signed)
[facility] [phone] called give update on the patient.  Her legs are fine still did wrap legs. She has two stage 2 wounds on her lower leg and buttocks. Brayton Layman would like to get some would care orders

## 2022-09-08 NOTE — Telephone Encounter (Signed)
Patient never read in mychart, advised patient

## 2022-09-08 NOTE — Telephone Encounter (Signed)
Marylene Buerger RN at [facility] in [place], [place] called stating that the pt was having difficulty cannulating at times and has had excessive bleeding post treatment. She was requesting an appt.  Reviewed pt's chart, returned call for clarification, two identifiers used. Pt has taken 30-45 min to achieve hemostasis post HD treatment and had cannulation difficulties for approx 1 week. Appts scheduled. Confirmed understanding.

## 2022-09-08 NOTE — Progress Notes (Signed)
Office Note     CC:  Fistula concerns Requesting Provider:  Emelda Fear, DO  HPI: Amy Moses is a 51 y.o. (May 23, 1971) female presenting at the request of .Emelda Fear, DO with access concerns.  Patient had a fistula placed by Dr. Oneida Alar in [id number] brachiocephalic.  The fistula has been working well, however recently dialysis nurses have tried to cannulate the fistula slightly more distal to normal.  They have had issues cannulating.  Once cannulated however, the fistula runs well.  No low flows, no high pressures.  Note patient has history of calciphylaxis, currently being followed by Dr. [name].  She has been over for previous angiograms, but has no showed for variety of reasons.  She describes her wound is healing very well, and was very reassured of the care Dr. [name] has provided.  Dialysis days Tuesday Thursday Saturday.  Past Medical History:  Diagnosis Date   Anemia    Blindness of right eye with low vision in contralateral eye    s/p victrectomy   Diabetes mellitus, type II ([place])    Dyslipidemia    Glaucoma    History of blood transfusion    Hypertension    Hypothyroidism (acquired)    Kidney disease    Stage 5   Pneumonia     Past Surgical History:  Procedure Laterality Date   ABDOMINAL AORTOGRAM W/LOWER EXTREMITY Bilateral 12/18/2020   Procedure: ABDOMINAL AORTOGRAM W/LOWER EXTREMITY;  Surgeon: Elam Dutch, MD;  Location: [place];  Service: Cardiovascular;  Laterality: Bilateral;   ABDOMINAL AORTOGRAM W/LOWER EXTREMITY Bilateral 01/25/2022   Procedure: ABDOMINAL AORTOGRAM W/LOWER EXTREMITY;  Surgeon: Serafina Mitchell, MD;  Location: [place] [facility];  Service: Cardiovascular;  Laterality: Bilateral;   AMPUTATION Right 02/16/2022   Procedure: RIGHT FOOT 5TH RAY AMPUTATION;  Surgeon: Newt Minion, MD;  Location: [place];  Service: Orthopedics;  Laterality: Right;   ANKLE FRACTURE SURGERY Right    AV FISTULA PLACEMENT Left 08/18/2020    Procedure: LEFT ARM BRACHIOCEPHALIC ARTERIOVENOUS (AV) FISTULA CREATION;  Surgeon: Elam Dutch, MD;  Location: [place];  Service: Vascular;  Laterality: Left;   BIOPSY  04/24/2021   Procedure: BIOPSY;  Surgeon: Eloise Harman, DO;  [facility];  Service: Endoscopy;;   CESAREAN SECTION     CHOLECYSTECTOMY     COLONOSCOPY  04/24/2021   Surgeon: Eloise Harman, DO;  nonbleeding internal hemorrhoids, 1 large (25 mm) pedunculated transverse colon polyp (prolapse type polyp) with adherent clot and stigmata of recent bleed.   COLONOSCOPY WITH PROPOFOL N/A 05/14/2021   Procedure: COLONOSCOPY WITH PROPOFOL;  Surgeon: Daneil Dolin, MD;  [facility]: [facility];  Service: Endoscopy;  Laterality: N/A;   ESOPHAGOGASTRODUODENOSCOPY (EGD) WITH PROPOFOL N/A 04/24/2021   Surgeon: Eloise Harman, DO;  duodenal erosions and gastritis biopsied (pathology with peptic duodenitis, reactive gastropathy with erosions/chronic inflammation, negative for H. pylori)   EYE SURGERY     Vatrectomy   HEMOSTASIS CLIP PLACEMENT  05/14/2021   Procedure: HEMOSTASIS CLIP PLACEMENT;  Surgeon: Daneil Dolin, MD;  [facility];  Service: Endoscopy;;   IR PERC TUN PERIT CATH WO PORT S&I /IMAG  09/15/2020   IR REMOVAL TUN CV CATH W/O FL  02/19/2021   IR US GUIDE VASC ACCESS RIGHT  09/15/2020   POLYPECTOMY  04/24/2021   Procedure: POLYPECTOMY;  Surgeon: Eloise Harman, DO;  [facility];  Service: Endoscopy;;   POLYPECTOMY  05/14/2021   Procedure:  POLYPECTOMY;  Surgeon: Daneil Dolin, MD;  [facility];  Service: Endoscopy;;   SKIN SPLIT GRAFT Bilateral 09/03/2021   Procedure: SKIN GRAFT BILATERAL LEGS;  Surgeon: Newt Minion, MD;  Location: [place];  Service: Orthopedics;  Laterality: Bilateral;   SKIN SPLIT GRAFT Left 12/10/2021   Procedure: IRRIGATION AND DEBRIDEMENT LEFT CALF, APPLICATION SPLIT THICKNESS SKIN GRAFT;  Surgeon: Newt Minion, MD;  Location: [place];   Service: Orthopedics;  Laterality: Left;   TOE SURGERY      Social History   Socioeconomic History   Marital status: Single    Spouse name: Not on file   Number of children: Not on file   Years of education: Not on file   Highest education level: Not on file  Occupational History   Not on file  Tobacco Use   Smoking status: Never   Smokeless tobacco: Never  Vaping Use   Vaping Use: Never used  Substance and Sexual Activity   Alcohol use: No   Drug use: No   Sexual activity: Yes    Birth control/protection: Condom  Other Topics Concern   Not on file  Social History Narrative   Not on file   Social Determinants of Health   Financial Resource Strain: Not on file  Food Insecurity: Not on file  Transportation Needs: Not on file  Physical Activity: Not on file  Stress: Not on file  Social Connections: Not on file  Intimate Partner Violence: Not on file   Family History  Problem Relation Age of Onset   Heart failure Mother    Heart disease Mother    Diabetes Mother    Kidney disease Mother    Heart failure Father    Diabetes Father    Heart disease Father    Diabetes Brother    Heart failure Maternal Grandmother    Heart failure Maternal Grandfather    Transient ischemic attack Maternal Grandfather    Colon cancer Neg Hx     Current Outpatient Medications  Medication Sig Dispense Refill   acetaminophen (TYLENOL) 500 MG tablet Take 1,000 mg by mouth every 6 (six) hours as needed for moderate pain.     atorvastatin (LIPITOR) 10 MG tablet Take 1 tablet (10 mg total) by mouth daily. (Patient taking differently: Take 10 mg by mouth at bedtime.) 90 tablet 5   AURYXIA 1 GM 210 MG(Fe) tablet Take 420 mg by mouth 2 (two) times daily with a meal.     Blood Glucose Monitoring Suppl (ACCU-CHEK GUIDE ME) w/Device KIT 1 Piece by Does not apply route as directed. 1 kit 0   Continuous Blood Gluc Receiver (FREESTYLE LIBRE 2 READER) DEVI As directed 1 each 0   Continuous Blood  Gluc Sensor (FREESTYLE LIBRE 2 SENSOR) MISC 1 Piece by Does not apply route every 14 (fourteen) days. 2 each 3   glucose blood (ACCU-CHEK GUIDE) test strip Use as instructed 150 each 2   HUMALOG KWIKPEN 100 UNIT/ML KwikPen Inject 5-11 Units into the skin 3 (three) times daily with meals. If eats 50% or more of meal. 15 mL 1   insulin glargine (LANTUS) 100 UNIT/ML injection Inject 0.2 mLs (20 Units total) into the skin at bedtime. 10 mL 1   levothyroxine (SYNTHROID) 75 MCG tablet Take 75 mcg by mouth daily.     pantoprazole (PROTONIX) 40 MG tablet Take 1 tablet (40 mg total) by mouth 2 (two) times daily. 60 tablet 5   polyethylene glycol (MIRALAX /  GLYCOLAX) 17 g packet Take 17 g by mouth daily as needed for mild constipation. 14 each 0   silver sulfADIAZINE (SILVADENE) 1 % cream Apply 1 application. topically daily. Apply to affected area daily plus dry dressing 400 g 3   Vitamin D, Ergocalciferol, (DRISDOL) 1.25 MG (50000 UNIT) CAPS capsule Take 50,000 Units by mouth every 14 (fourteen) days.     No current facility-administered medications for this visit.    Allergies  Allergen Reactions   Ace Inhibitors Cough     REVIEW OF SYSTEMS:  _0  denotes positive finding, _1  denotes negative finding Cardiac  Comments:  Chest pain or chest pressure:    Shortness of breath upon exertion:    Short of breath when lying flat:    Irregular heart rhythm:        Vascular    Pain in calf, thigh, or hip brought on by ambulation:    Pain in feet at night that wakes you up from your sleep:     Blood clot in your veins:    Leg swelling:         Pulmonary    Oxygen at home:    Productive cough:     Wheezing:         Neurologic    Sudden weakness in arms or legs:     Sudden numbness in arms or legs:     Sudden onset of difficulty speaking or slurred speech:    Temporary loss of vision in one eye:     Problems with dizziness:         Gastrointestinal    Blood in stool:     Vomited blood:          Genitourinary    Burning when urinating:     Blood in urine:        Psychiatric    Major depression:         Hematologic    Bleeding problems:    Problems with blood clotting too easily:        Skin    Rashes or ulcers:        Constitutional    Fever or chills:      PHYSICAL EXAMINATION:  There were no vitals filed for this visit.  General:  WDWN in NAD; vital signs documented above Gait: Not observed HENT: WNL, normocephalic Pulmonary: normal non-labored breathing , without wheezing Cardiac: regular HR Abdomen: soft, NT, no masses Skin: without rashes Vascular Exam/Pulses:  Right Left  Radial 2+ (normal) 1+ (normal)                       Extremities: Palpable thrill in the left arm brachiocephalic fistula Musculoskeletal: no muscle wasting or atrophy  Neurologic: A&O X 3;  No focal weakness or paresthesias are detected Psychiatric:  The pt has Normal affect.   Non-Invasive Vascular Imaging:   Fistula duplex ultrasound was reviewed demonstrating flows of 570 mL/min.  Optimal flow range 600 mL+.  Flow within the outflow vein was decreased, likely due to some of the tortuosity in the outflow vein.    ASSESSMENT/PLAN: Amy Moses is a 51 y.o. female presenting with episodes of difficult cannulation.  Once cannulated, the fistula works well with normal flow rates, no increased pressures. Esta and I discussed fistulogram, however being that she is not having any issues once cannulated, I think this will be low yield.  I asked that she discuss moving her access points more  proximally, closer to the arterial anastomosis as the distal vein becomes tortuous.  Should she continue to have difficulties with access or flow rate or pressure limit HD runs, I asked that she call my office immediately as I am happy to pursue fistulogram.   Broadus John, MD [facility] [phone]

## 2022-09-09 ENCOUNTER — Ambulatory Visit (INDEPENDENT_AMBULATORY_CARE_PROVIDER_SITE_OTHER): Payer: Medicare Other | Admitting: Vascular Surgery

## 2022-09-09 ENCOUNTER — Encounter: Payer: Self-pay | Admitting: Vascular Surgery

## 2022-09-09 ENCOUNTER — Ambulatory Visit (HOSPITAL_COMMUNITY)
Admission: RE | Admit: 2022-09-09 | Discharge: 2022-09-09 | Disposition: A | Discharge status: Home / Self Care | Payer: Medicare Other | Source: Ambulatory Visit | Attending: Vascular Surgery | Admitting: Vascular Surgery

## 2022-09-09 VITALS — BP 124/65 | HR 73 | Temp 98.1°F | Resp 20 | Ht 66.0 in | Wt 206.0 lb

## 2022-09-09 DIAGNOSIS — N186 End stage renal disease: Secondary | ICD-10-CM | POA: Diagnosis present

## 2022-09-09 DIAGNOSIS — Z992 Dependence on renal dialysis: Secondary | ICD-10-CM

## 2022-09-09 NOTE — Telephone Encounter (Signed)
I SW Monica at [place] she would like to use saline to clean area, put silver AG and border dressing over those two small areas. I gave her to VO okay to do so.

## 2022-09-12 ENCOUNTER — Encounter (HOSPITAL_BASED_OUTPATIENT_CLINIC_OR_DEPARTMENT_OTHER): Payer: Self-pay | Admitting: Cardiovascular Disease

## 2022-09-12 ENCOUNTER — Ambulatory Visit (INDEPENDENT_AMBULATORY_CARE_PROVIDER_SITE_OTHER): Payer: Medicare Other | Admitting: Cardiovascular Disease

## 2022-09-12 DIAGNOSIS — I272 Pulmonary hypertension, unspecified: Secondary | ICD-10-CM | POA: Insufficient documentation

## 2022-09-12 DIAGNOSIS — I10 Essential (primary) hypertension: Secondary | ICD-10-CM

## 2022-09-12 DIAGNOSIS — I5032 Chronic diastolic (congestive) heart failure: Secondary | ICD-10-CM | POA: Diagnosis not present

## 2022-09-12 DIAGNOSIS — N186 End stage renal disease: Secondary | ICD-10-CM | POA: Diagnosis not present

## 2022-09-12 DIAGNOSIS — Z992 Dependence on renal dialysis: Secondary | ICD-10-CM

## 2022-09-12 HISTORY — DX: Pulmonary hypertension, unspecified: I27.20

## 2022-09-12 MED ORDER — MIDODRINE HCL 2.5 MG PO TABS: ORAL_TABLET | ORAL | 5 refills | Status: DC

## 2022-09-12 MED ORDER — BLOOD PRESSURE MONITOR KIT: PACK | 0 refills | Status: DC

## 2022-09-12 NOTE — Progress Notes (Signed)
Cardiology Office Note:    Date:  09/12/2022   ID:  Amy Moses, DOB December 27, 1970, MRN [id number]  PCP:  Emelda Fear, DO   [place] Providers Cardiologist:  None     Referring MD: Emelda Fear, DO   No chief complaint on file.   History of Present Illness:    Amy Moses is a 51 y.o. female with a hx of diabetes, hypertension, hyperlipidemia, ESRD on HD, right eye blindness, and hypothyroidism. She was admitted to [name] 6/23 with bacteremia. Plans were made for 4 weeks of antibiotics. She had non-healing lower extremity wounds and is followed by vascular and orthopedics. She had an Echo that revealed LVEF 45-50% with global hypokinesis and grade 2 diastolic dysfunction. PASP was 54 mm Hg. They recommended cardiology follow-up.  She was seen 06/2022 and was feeling well.  She did note some orthopnea she laid on her back for prolonged periods of time.  She had a repeat echo 07/2022 that revealed LVEF 50 to 55% and indeterminate diastolic function.  PASP was 64 mmHg.  RA pressure 8.  She had a nuclear stress test that revealed LVEF 60% and a fixed defect in the mid to basal inferior and inferolateral regions.  Wall motion was normal and the area was thought to be due to diaphragmatic activity.  Overall was low risk.  Today, patient states that she is doing well overall. However, she reports episodes of hypotension, with systolic pressures into the 90s, during her dialysis treatments over the last x1.5 months. She feels hot when her BP is lower. Typically staff stops the treatment for 10-15 minutes and her BP improves. She is always able to complete her treatments afterward. Patient reports that her nephrologist has recommended starting Midodrine previously.  She denies any arthralgias or joint swelling. She attributes her hand contractures to her diabetic neuropathy. She has persistent BLE swelling particularly in her upper legs. She wears compression stockings  with some relief. She denies any shortness of breath. She is not exercising.   Past Medical History:  Diagnosis Date   Anemia    Blindness of right eye with low vision in contralateral eye    s/p victrectomy   Chronic diastolic heart failure ([place]) 03/30/2021   Diabetes mellitus, type II ([place])    Dyslipidemia    Glaucoma    History of blood transfusion    Hypertension    Hypothyroidism (acquired)    Kidney disease    Stage 5   Pneumonia    Pulmonary hypertension, unspecified ([place]) 09/12/2022    Past Surgical History:  Procedure Laterality Date   ABDOMINAL AORTOGRAM W/LOWER EXTREMITY Bilateral 12/18/2020   Procedure: ABDOMINAL AORTOGRAM W/LOWER EXTREMITY;  Surgeon: Elam Dutch, MD;  Location: [place] [facility];  Service: Cardiovascular;  Laterality: Bilateral;   ABDOMINAL AORTOGRAM W/LOWER EXTREMITY Bilateral 01/25/2022   Procedure: ABDOMINAL AORTOGRAM W/LOWER EXTREMITY;  Surgeon: Serafina Mitchell, MD;  Location: [place] [facility];  Service: Cardiovascular;  Laterality: Bilateral;   AMPUTATION Right 02/16/2022   Procedure: RIGHT FOOT 5TH RAY AMPUTATION;  Surgeon: Newt Minion, MD;  Location: [place];  Service: Orthopedics;  Laterality: Right;   ANKLE FRACTURE SURGERY Right    AV FISTULA PLACEMENT Left 08/18/2020   Procedure: LEFT ARM BRACHIOCEPHALIC ARTERIOVENOUS (AV) FISTULA CREATION;  Surgeon: Elam Dutch, MD;  Location: [place];  Service: Vascular;  Laterality: Left;   BIOPSY  04/24/2021   Procedure: BIOPSY;  Surgeon: Eloise Harman, DO;  [facility];  Service: Endoscopy;;   CESAREAN SECTION     CHOLECYSTECTOMY     COLONOSCOPY  04/24/2021   Surgeon: Eloise Harman, DO;  nonbleeding internal hemorrhoids, 1 large (25 mm) pedunculated transverse colon polyp (prolapse type polyp) with adherent clot and stigmata of recent bleed.   COLONOSCOPY WITH PROPOFOL N/A 05/14/2021   Procedure: COLONOSCOPY WITH PROPOFOL;  Surgeon: Daneil Dolin, MD;  [facility];  Service: Endoscopy;  Laterality: N/A;   ESOPHAGOGASTRODUODENOSCOPY (EGD) WITH PROPOFOL N/A 04/24/2021   Surgeon: Eloise Harman, DO;  duodenal erosions and gastritis biopsied (pathology with peptic duodenitis, reactive gastropathy with erosions/chronic inflammation, negative for H. pylori)   EYE SURGERY     Vatrectomy   HEMOSTASIS CLIP PLACEMENT  05/14/2021   Procedure: HEMOSTASIS CLIP PLACEMENT;  Surgeon: Daneil Dolin, MD;  [facility];  Service: Endoscopy;;   IR PERC TUN PERIT CATH WO PORT S&I /IMAG  09/15/2020   IR REMOVAL TUN CV CATH W/O FL  02/19/2021   IR US GUIDE VASC ACCESS RIGHT  09/15/2020   POLYPECTOMY  04/24/2021   Procedure: POLYPECTOMY;  Surgeon: Eloise Harman, DO;  [facility];  Service: Endoscopy;;   POLYPECTOMY  05/14/2021   Procedure: POLYPECTOMY;  Surgeon: Daneil Dolin, MD;  [facility];  Service: Endoscopy;;   SKIN SPLIT GRAFT Bilateral 09/03/2021   Procedure: SKIN GRAFT BILATERAL LEGS;  Surgeon: Newt Minion, MD;  Location: [place];  Service: Orthopedics;  Laterality: Bilateral;   SKIN SPLIT GRAFT Left 12/10/2021   Procedure: IRRIGATION AND DEBRIDEMENT LEFT CALF, APPLICATION SPLIT THICKNESS SKIN GRAFT;  Surgeon: Newt Minion, MD;  Location: [place];  Service: Orthopedics;  Laterality: Left;   TOE SURGERY      Current Medications: Current Meds  Medication Sig   acetaminophen (TYLENOL) 500 MG tablet Take 1,000 mg by mouth every 6 (six) hours as needed for moderate pain.   atorvastatin (LIPITOR) 10 MG tablet Take 1 tablet (10 mg total) by mouth daily. (Patient taking differently: Take 10 mg by mouth at bedtime.)   AURYXIA 1 GM 210 MG(Fe) tablet Take 420 mg by mouth 2 (two) times daily with a meal.   Blood Glucose Monitoring Suppl (ACCU-CHEK GUIDE ME) w/Device KIT 1 Piece by Does not apply route as directed.   Blood Pressure Monitor KIT TAKE BLOOD PRESSURE DAILY   Continuous Blood Gluc Receiver (FREESTYLE LIBRE  2 READER) DEVI As directed   Continuous Blood Gluc Sensor (FREESTYLE LIBRE 2 SENSOR) MISC 1 Piece by Does not apply route every 14 (fourteen) days.   glucose blood (ACCU-CHEK GUIDE) test strip Use as instructed   HUMALOG KWIKPEN 100 UNIT/ML KwikPen Inject 5-11 Units into the skin 3 (three) times daily with meals. If eats 50% or more of meal.   insulin glargine (LANTUS) 100 UNIT/ML injection Inject 0.2 mLs (20 Units total) into the skin at bedtime.   levothyroxine (SYNTHROID) 75 MCG tablet Take 75 mcg by mouth daily.   midodrine (PROAMATINE) 2.5 MG tablet TAKE 1 TABLET 1 HOUR PRIOR TO DIALYSIS   pantoprazole (PROTONIX) 40 MG tablet Take 1 tablet (40 mg total) by mouth 2 (two) times daily.   polyethylene glycol (MIRALAX / GLYCOLAX) 17 g packet Take 17 g by mouth daily as needed for mild constipation.   silver sulfADIAZINE (SILVADENE) 1 % cream Apply 1 application. topically daily. Apply to affected area daily plus dry dressing   torsemide (DEMADEX) 100 MG tablet Take  100 mg by mouth daily.   Vitamin D, Ergocalciferol, (DRISDOL) 1.25 MG (50000 UNIT) CAPS capsule Take 50,000 Units by mouth every 7 (seven) days.     Allergies:   Ace inhibitors   Social History   Socioeconomic History   Marital status: Single    Spouse name: Not on file   Number of children: Not on file   Years of education: Not on file   Highest education level: Not on file  Occupational History   Not on file  Tobacco Use   Smoking status: Never   Smokeless tobacco: Never  Vaping Use   Vaping Use: Never used  Substance and Sexual Activity   Alcohol use: No   Drug use: No   Sexual activity: Yes    Birth control/protection: Condom  Other Topics Concern   Not on file  Social History Narrative   Not on file   Social Determinants of Health   Financial Resource Strain: Not on file  Food Insecurity: Not on file  Transportation Needs: Not on file  Physical Activity: Not on file  Stress: Not on file  Social  Connections: Not on file     Family History: The patient's family history includes Diabetes in her brother, father, and mother; Heart disease in her father and mother; Heart failure in her father, maternal grandfather, maternal grandmother, and mother; Kidney disease in her mother; Transient ischemic attack in her maternal grandfather. There is no history of Colon cancer.  ROS:   Please see the history of present illness. (+) Bilateral leg swelling All other systems reviewed and are negative.  EKGs/Labs/Other Studies Reviewed:    The following studies were reviewed today:  Lexiscan Myoview  07/2022:   Findings are consistent with no prior ischemia. The study is low risk.   No ST deviation was noted.   LV perfusion is abnormal. Defect 1: There is a large defect with moderate reduction in uptake present in the mid to basal inferior and inferolateral location(s) that is fixed. There is normal wall motion in the defect area. Consistent with artifact caused by subdiaphragmatic activity.   Left ventricular function is normal. Nuclear stress EF: 60 %. The left ventricular ejection fraction is normal (55-65%). End diastolic cavity size is mildly enlarged. End systolic cavity size is normal.   Prior study not available for comparison.   Large size, moderate intensity fixed basal to mid inferior, inferolateral and apical perfusion defect with subdiaphragmatic attenuation artifact. No reversible ischemia. LVEF 60% with normal wall motion. Mild RV uptake may suggest pulmonary hypertension. This is a low risk study. No prior study for comparison.\   Echo 07/27/22: IMPRESSIONS     1. Left ventricular ejection fraction, by estimation, is 50 to 55%. The  left ventricle has low normal function. The left ventricle has no regional  wall motion abnormalities. The left ventricular internal cavity size was  mildly dilated. Left ventricular  diastolic parameters are indeterminate. Elevated left ventricular   end-diastolic pressure.   2. Right ventricular systolic function is mildly reduced. The right  ventricular size is moderately enlarged. There is severely elevated  pulmonary artery systolic pressure. The estimated right ventricular  systolic pressure is 09.4 mmHg.   3. Left atrial size was moderately dilated.   4. Right atrial size was mildly dilated.   5. The mitral valve is degenerative. Moderate to severe mitral valve  regurgitation. No evidence of mitral stenosis.   6. Tricuspid valve regurgitation is severe.   7. The aortic valve  is calcified. There is moderate calcification of the  aortic valve. There is severe thickening of the aortic valve. Aortic valve  regurgitation is not visualized. Aortic valve sclerosis/calcification is  present, without any evidence of   aortic stenosis.   8. The inferior vena cava is dilated in size with >50% respiratory  variability, suggesting right atrial pressure of 8 mmHg.   9. Small to moderate circumferential pericardial effusion with no Rv  diastolic collapse or RA inversion. Respirophasic changes in MV inflow  velocities was < 25%.   Echocardiogram  05/02/2022  1. Left ventricular ejection fraction, by estimation, is 45 to 50%. Left  ventricular ejection fraction by PLAX is 46 %. The left ventricle has  mildly decreased function. The left ventricle demonstrates global  hypokinesis. Left ventricular diastolic  parameters are consistent with Grade II diastolic dysfunction  (pseudonormalization). Elevated left ventricular end-diastolic pressure.   2. Right ventricular systolic function is mildly reduced. The right  ventricular size is normal. There is moderately elevated pulmonary artery  systolic pressure. The estimated right ventricular systolic pressure is  78.6 mmHg.   3. Left atrial size was severely dilated.   4. Right atrial size was mildly dilated.   5. The pericardial effusion is circumferential. There is no evidence of  cardiac  tamponade.   6. The mitral valve is abnormal. Mild mitral valve regurgitation.   7. Tricuspid valve regurgitation is mild to moderate.   8. The aortic valve is tricuspid. Aortic valve regurgitation is not  visualized. Aortic valve sclerosis/calcification is present, without any  evidence of aortic stenosis. Aortic valve mean gradient measures 5.0 mmHg.   9. The inferior vena cava is dilated in size with >50% respiratory  variability, suggesting right atrial pressure of 8 mmHg.   Comparison(s): Changes from prior study are noted. 04/01/2021: LVEF 55-60%,  grade 2 DD.   Abdominal Aortogram 01/25/2022: Findings:             Aortogram: No obvious renal stenosis identified.  The infrarenal abdominal aorta is widely patent.  Bilateral common and external iliac arteries are widely patent.            Right Lower Extremity: Right common femoral profundofemoral and superficial femoral artery are widely patent.  The popliteal artery is widely patent.  There is two-vessel runoff via the peroneal and posterior tibial artery.  There is diffuse disease of the small vessels in the foot            Left Lower Extremity: Left common femoral profundofemoral and superficial femoral artery are widely patent.  The popliteal artery is widely patent.  The peroneal artery is the dominant runoff.  There is reconstitution of the posterior tibial from peroneal collaterals at the ankle.  Anterior tibial artery appears to become diminutive in the lower leg.  Impression:            #1  No evidence of inflow or outflow stenosis            #2  On the right, there is two-vessel runoff via the peroneal and posterior tibial artery without any lesions amenable to intervention            #3  Dominant runoff on the left is the peroneal artery  Echo 04/01/2021:  1. Left ventricular ejection fraction, by estimation, is 55 to 60%. The  left ventricle has normal function. The left ventricle has no regional  wall motion abnormalities.  Left ventricular diastolic parameters are  consistent with  Grade II diastolic  dysfunction (pseudonormalization).   2. Right ventricular systolic function is normal. The right ventricular  size is normal.   3. Left atrial size was mildly dilated.   4. A small pericardial effusion is present.   5. The mitral valve is normal in structure. No evidence of mitral valve  regurgitation.   6. Tricuspid valve regurgitation is mild to moderate.   7. The aortic valve is normal in structure. Aortic valve regurgitation is  not visualized.    EKG: EKG is personally reviewed.  06/24/22: EKG was not ordered.   Recent Labs: 01/24/2022: TSH 3.109 04/30/2022: ALT 13; Magnesium 2.1 05/03/2022: BUN 55; Creatinine, Ser 7.16; Hemoglobin 10.5; Platelets 146; Potassium 5.0; Sodium 135  Recent Lipid Panel    Component Value Date/Time   CHOL 85 01/26/2022 0555   TRIG 98 01/26/2022 0555   HDL 25 (L) 01/26/2022 0555   CHOLHDL 3.4 01/26/2022 0555   VLDL 20 01/26/2022 0555   LDLCALC 40 01/26/2022 0555     Physical Exam:    VS:  BP 110/70 (BP Location: Right Arm, Patient Position: Sitting, Cuff Size: Normal)   Pulse 73   Ht 5' 6"  (1.676 m)   Wt 210 lb (95.3 kg)   LMP 08/22/2015 (Approximate)   SpO2 93%   BMI 33.89 kg/m     Wt Readings from Last 3 Encounters:  09/12/22 210 lb (95.3 kg)  09/09/22 206 lb (93.4 kg)  07/27/22 205 lb (93 kg)    VS:  BP 110/70 (BP Location: Right Arm, Patient Position: Sitting, Cuff Size: Normal)   Pulse 73   Ht 5' 6"  (1.676 m)   Wt 210 lb (95.3 kg)   LMP 08/22/2015 (Approximate)   SpO2 93%   BMI 33.89 kg/m  , BMI Body mass index is 33.89 kg/m. GENERAL:  Well appearing; In wheelchair HEENT: Pupils equal round and reactive, fundi not visualized, oral mucosa unremarkable NECK:  No jugular venous distention, waveform within normal limits, carotid upstroke brisk and symmetric, no bruits, no thyromegaly LUNGS:  Clear to auscultation bilaterally HEART:  RRR.  PMI not  displaced or sustained,S1 and S2 within normal limits, no S3, no S4, no clicks, no rubs, VI/VI systolic murmur throughout ABD:  Flat, positive bowel sounds normal in frequency in pitch, no bruits, no rebound, no guarding, no midline pulsatile mass, no hepatomegaly, no splenomegaly EXT:  Unable to assess due to leg wrappings in place SKIN:  No rashes no nodules NEURO:  Cranial nerves II through XII grossly intact, motor grossly intact throughout PSYCH:  Cognitively intact, oriented to person place and time   ASSESSMENT:    1. Chronic diastolic heart failure ([place])   2. Essential hypertension   3. Pulmonary hypertension, unspecified ([place])   4. ESRD (end stage renal disease) on dialysis [facility])     PLAN:    In order of problems listed above:  Chronic diastolic heart failure (HCC) Systolic function decreased while acutely ill in the hospital to 45-50%.  Improved to 50-55% on repeat.    Essential hypertension Her blood pressure has been running low since she started dialysis.  She does okay on nondialysis days but her dialysis sessions often had to be paused due to hypotension.  She remains volume overloaded in her lower extremities.  We will give midodrine 2.5 mg to take 1 hour prior to dialysis sessions.  Pulmonary hypertension, unspecified ([place]) Pulmonary pressures were severely elevated on echo both initially and on repeat.  Now that she is  getting regular dialysis this should not be due to volume overload.  We will check PFTs, ANA, rheumatoid factor, ANCA, anti-CCP, anti-SCL, and anti-SM.  She had a VQ scan that was negative.  ESRD (end stage renal disease) on dialysis ([place]) Blood pressure continues to drop with dialysis.  Adding midodrine 2.5 mg 1 hour before as above.  Asked patient to track her blood pressures and bring to follow-up.          Follow up: in 2-3 months.  Medication Adjustments/Labs and Tests Ordered: Current medicines are reviewed at length with the patient today.   Concerns regarding medicines are outlined above.  Orders Placed This Encounter  Procedures   ANCA screen with reflex titer   ANA   Anti-scleroderma antibody   Anti-Smith antibody   Rheumatoid factor   ANCA Profile   Ceruloplasmin   Pulmonary Function Test   Meds ordered this encounter  Medications   midodrine (PROAMATINE) 2.5 MG tablet    Sig: TAKE 1 TABLET 1 HOUR PRIOR TO DIALYSIS    Dispense:  15 tablet    Refill:  5   Blood Pressure Monitor KIT    Sig: TAKE BLOOD PRESSURE DAILY    Dispense:  1 kit    Refill:  0    Patient Instructions  Medication Instructions:  START MIDODRINE 2.5 MG 1 TABLET 1 HOURS PRIOR TO DIALYSIS   *If you need a refill on your cardiac medications before your next appointment, please call your pharmacy*  Lab Work: LABS UPSTAIRS [address] [place]   If you have labs (blood work) drawn today and your tests are completely normal, you will receive your results only by: MyChart Message (if you have MyChart) OR A paper copy in the mail If you have any lab test that is abnormal or we need to change your treatment, we will call you to review the results.  Testing/Procedures: Your physician has recommended that you have a pulmonary function test. Pulmonary Function Tests are a group of tests that measure how well air moves in and out of your lungs.  Follow-Up: At [facility], you and your health needs are our priority.  As part of our continuing mission to provide you with exceptional heart care, we have created designated Provider Care Teams.  These Care Teams include your primary Cardiologist (physician) and Advanced Practice Providers (APPs -  Physician Assistants and Nurse Practitioners) who all work together to provide you with the care you need, when you need it.  We recommend signing up for the patient portal called "MyChart".  Sign up information is provided on this After Visit Summary.  MyChart is used to connect with patients for Virtual  Visits (Telemedicine).  Patients are able to view lab/test results, encounter notes, upcoming appointments, etc.  Non-urgent messages can be sent to your provider as well.   To learn more about what you can do with MyChart, go to [email or web address].    Your next appointment:   2-3  month(s)  The format for your next appointment:   In Person  Provider:   Skeet Latch, MD            I,Alexis Herring,acting as a scribe for Skeet Latch, MD.,have documented all relevant documentation on the behalf of Skeet Latch, MD,as directed by  Skeet Latch, MD while in the presence of Skeet Latch, MD.  I, [place] Linsey, MD have reviewed all documentation for this visit.  The documentation of the exam, diagnosis, procedures, and orders  on 09/12/2022 are all accurate and complete.  Signed, Skeet Latch, MD  09/12/2022 9:58 AM    [place]

## 2022-09-12 NOTE — Assessment & Plan Note (Signed)
Her blood pressure has been running low since she started dialysis.  She does okay on nondialysis days but her dialysis sessions often had to be paused due to hypotension.  She remains volume overloaded in her lower extremities.  We will give midodrine 2.5 mg to take 1 hour prior to dialysis sessions.

## 2022-09-12 NOTE — Assessment & Plan Note (Signed)
Systolic function decreased while acutely ill in the hospital to 45-50%.  Improved to 50-55% on repeat.

## 2022-09-12 NOTE — Progress Notes (Deleted)
Cardiology Office Note:    Date:  09/12/2022   ID:  Amy Moses, DOB 29-Apr-1971, MRN [id number]  PCP:  Emelda Fear, DO   [facility] Providers Cardiologist:  None     Referring MD: Emelda Fear, DO   No chief complaint on file.   History of Present Illness:    Amy Moses is a 51 y.o. female with a hx of diabetes, hypertension, hyperlipidemia, ESRD on HD, right eye blindness, and hypothyroidism. She was admitted to [facility] 6/23 with bacteremia. Plans were made for 4 weeks of antibiotics. She had non-healing lower extremity wounds and is followed by vascular and orthopedics. She had an Echo that revealed LVEF 45-50% with global hypokinesis and grade 2 diastolic dysfunction. PASP was 54 mm Hg. They recommended cardiology follow-up.  She was seen 06/2022 and was feeling well.  She did note some orthopnea she laid on her back for prolonged periods of time.  She had a repeat echo 07/2022 that revealed LVEF 50 to 55% and indeterminate diastolic function.  PASP was 64 mmHg.  RA pressure 8.  She had a nuclear stress test that revealed LVEF 60% and a fixed defect in the mid to basal inferior and inferolateral regions.  Wall motion was normal and the area was thought to be due to diaphragmatic activity.  Overall was low risk.   Past Medical History:  Diagnosis Date   Anemia    Blindness of right eye with low vision in contralateral eye    s/p victrectomy   Diabetes mellitus, type II ([place])    Dyslipidemia    Glaucoma    History of blood transfusion    Hypertension    Hypothyroidism (acquired)    Kidney disease    Stage 5   Pneumonia     Past Surgical History:  Procedure Laterality Date   ABDOMINAL AORTOGRAM W/LOWER EXTREMITY Bilateral 12/18/2020   Procedure: ABDOMINAL AORTOGRAM W/LOWER EXTREMITY;  Surgeon: Elam Dutch, MD;  Location: [facility];  Service: Cardiovascular;  Laterality: Bilateral;   ABDOMINAL AORTOGRAM W/LOWER EXTREMITY  Bilateral 01/25/2022   Procedure: ABDOMINAL AORTOGRAM W/LOWER EXTREMITY;  Surgeon: Serafina Mitchell, MD;  Location: [facility];  Service: Cardiovascular;  Laterality: Bilateral;   AMPUTATION Right 02/16/2022   Procedure: RIGHT FOOT 5TH RAY AMPUTATION;  Surgeon: Newt Minion, MD;  Location: [facility];  Service: Orthopedics;  Laterality: Right;   ANKLE FRACTURE SURGERY Right    AV FISTULA PLACEMENT Left 08/18/2020   Procedure: LEFT ARM BRACHIOCEPHALIC ARTERIOVENOUS (AV) FISTULA CREATION;  Surgeon: Elam Dutch, MD;  Location: [facility];  Service: Vascular;  Laterality: Left;   BIOPSY  04/24/2021   Procedure: BIOPSY;  Surgeon: Eloise Harman, DO;  [facility];  Service: Endoscopy;;   CESAREAN SECTION     CHOLECYSTECTOMY     COLONOSCOPY  04/24/2021   Surgeon: Eloise Harman, DO;  nonbleeding internal hemorrhoids, 1 large (25 mm) pedunculated transverse colon polyp (prolapse type polyp) with adherent clot and stigmata of recent bleed.   COLONOSCOPY WITH PROPOFOL N/A 05/14/2021   Procedure: COLONOSCOPY WITH PROPOFOL;  Surgeon: Daneil Dolin, MD;  [facility];  Service: Endoscopy;  Laterality: N/A;   ESOPHAGOGASTRODUODENOSCOPY (EGD) WITH PROPOFOL N/A 04/24/2021   Surgeon: Eloise Harman, DO;  duodenal erosions and gastritis biopsied (pathology with peptic duodenitis, reactive gastropathy with erosions/chronic inflammation, negative for H. pylori)   EYE SURGERY     Vatrectomy   HEMOSTASIS CLIP PLACEMENT  05/14/2021   Procedure: HEMOSTASIS CLIP PLACEMENT;  Surgeon: Daneil Dolin, MD;  [facility];  Service: Endoscopy;;   IR PERC TUN PERIT CATH WO PORT S&I /IMAG  09/15/2020   IR REMOVAL TUN CV CATH W/O FL  02/19/2021   IR US GUIDE VASC ACCESS RIGHT  09/15/2020   POLYPECTOMY  04/24/2021   Procedure: POLYPECTOMY;  Surgeon: Eloise Harman, DO;  [facility];  Service: Endoscopy;;   POLYPECTOMY  05/14/2021   Procedure: POLYPECTOMY;   Surgeon: Daneil Dolin, MD;  [facility];  Service: Endoscopy;;   SKIN SPLIT GRAFT Bilateral 09/03/2021   Procedure: SKIN GRAFT BILATERAL LEGS;  Surgeon: Newt Minion, MD;  Location: [place];  Service: Orthopedics;  Laterality: Bilateral;   SKIN SPLIT GRAFT Left 12/10/2021   Procedure: IRRIGATION AND DEBRIDEMENT LEFT CALF, APPLICATION SPLIT THICKNESS SKIN GRAFT;  Surgeon: Newt Minion, MD;  Location: [place];  Service: Orthopedics;  Laterality: Left;   TOE SURGERY      Current Medications: No outpatient medications have been marked as taking for the 09/12/22 encounter (Appointment) with Skeet Latch, MD.     Allergies:   Ace inhibitors   Social History   Socioeconomic History   Marital status: Single    Spouse name: Not on file   Number of children: Not on file   Years of education: Not on file   Highest education level: Not on file  Occupational History   Not on file  Tobacco Use   Smoking status: Never   Smokeless tobacco: Never  Vaping Use   Vaping Use: Never used  Substance and Sexual Activity   Alcohol use: No   Drug use: No   Sexual activity: Yes    Birth control/protection: Condom  Other Topics Concern   Not on file  Social History Narrative   Not on file   Social Determinants of Health   Financial Resource Strain: Not on file  Food Insecurity: Not on file  Transportation Needs: Not on file  Physical Activity: Not on file  Stress: Not on file  Social Connections: Not on file     Family History: The patient's family history includes Diabetes in her brother, father, and mother; Heart disease in her father and mother; Heart failure in her father, maternal grandfather, maternal grandmother, and mother; Kidney disease in her mother; Transient ischemic attack in her maternal grandfather. There is no history of Colon cancer.  ROS:   Please see the history of present illness. (+) Orthopnea (+) Bilateral feet edema     All other systems reviewed  and are negative.  EKGs/Labs/Other Studies Reviewed:    The following studies were reviewed today:  Lexiscan Myoview  07/2022:   Findings are consistent with no prior ischemia. The study is low risk.   No ST deviation was noted.   LV perfusion is abnormal. Defect 1: There is a large defect with moderate reduction in uptake present in the mid to basal inferior and inferolateral location(s) that is fixed. There is normal wall motion in the defect area. Consistent with artifact caused by subdiaphragmatic activity.   Left ventricular function is normal. Nuclear stress EF: 60 %. The left ventricular ejection fraction is normal (55-65%). End diastolic cavity size is mildly enlarged. End systolic cavity size is normal.   Prior study not available for comparison.   Large size, moderate intensity fixed basal to mid inferior, inferolateral and apical perfusion defect with subdiaphragmatic attenuation artifact. No reversible  ischemia. LVEF 60% with normal wall motion. Mild RV uptake may suggest pulmonary hypertension. This is a low risk study. No prior study for comparison.\   Echo 07/27/22: IMPRESSIONS     1. Left ventricular ejection fraction, by estimation, is 50 to 55%. The  left ventricle has low normal function. The left ventricle has no regional  wall motion abnormalities. The left ventricular internal cavity size was  mildly dilated. Left ventricular  diastolic parameters are indeterminate. Elevated left ventricular  end-diastolic pressure.   2. Right ventricular systolic function is mildly reduced. The right  ventricular size is moderately enlarged. There is severely elevated  pulmonary artery systolic pressure. The estimated right ventricular  systolic pressure is 10.9 mmHg.   3. Left atrial size was moderately dilated.   4. Right atrial size was mildly dilated.   5. The mitral valve is degenerative. Moderate to severe mitral valve  regurgitation. No evidence of mitral stenosis.   6.  Tricuspid valve regurgitation is severe.   7. The aortic valve is calcified. There is moderate calcification of the  aortic valve. There is severe thickening of the aortic valve. Aortic valve  regurgitation is not visualized. Aortic valve sclerosis/calcification is  present, without any evidence of   aortic stenosis.   8. The inferior vena cava is dilated in size with >50% respiratory  variability, suggesting right atrial pressure of 8 mmHg.   9. Small to moderate circumferential pericardial effusion with no Rv  diastolic collapse or RA inversion. Respirophasic changes in MV inflow  velocities was < 25%.   Echocardiogram  05/02/2022  1. Left ventricular ejection fraction, by estimation, is 45 to 50%. Left  ventricular ejection fraction by PLAX is 46 %. The left ventricle has  mildly decreased function. The left ventricle demonstrates global  hypokinesis. Left ventricular diastolic  parameters are consistent with Grade II diastolic dysfunction  (pseudonormalization). Elevated left ventricular end-diastolic pressure.   2. Right ventricular systolic function is mildly reduced. The right  ventricular size is normal. There is moderately elevated pulmonary artery  systolic pressure. The estimated right ventricular systolic pressure is  32.3 mmHg.   3. Left atrial size was severely dilated.   4. Right atrial size was mildly dilated.   5. The pericardial effusion is circumferential. There is no evidence of  cardiac tamponade.   6. The mitral valve is abnormal. Mild mitral valve regurgitation.   7. Tricuspid valve regurgitation is mild to moderate.   8. The aortic valve is tricuspid. Aortic valve regurgitation is not  visualized. Aortic valve sclerosis/calcification is present, without any  evidence of aortic stenosis. Aortic valve mean gradient measures 5.0 mmHg.   9. The inferior vena cava is dilated in size with >50% respiratory  variability, suggesting right atrial pressure of 8 mmHg.    Comparison(s): Changes from prior study are noted. 04/01/2021: LVEF 55-60%,  grade 2 DD.   Abdominal Aortogram 01/25/2022: Findings:             Aortogram: No obvious renal stenosis identified.  The infrarenal abdominal aorta is widely patent.  Bilateral common and external iliac arteries are widely patent.            Right Lower Extremity: Right common femoral profundofemoral and superficial femoral artery are widely patent.  The popliteal artery is widely patent.  There is two-vessel runoff via the peroneal and posterior tibial artery.  There is diffuse disease of the small vessels in the foot  Left Lower Extremity: Left common femoral profundofemoral and superficial femoral artery are widely patent.  The popliteal artery is widely patent.  The peroneal artery is the dominant runoff.  There is reconstitution of the posterior tibial from peroneal collaterals at the ankle.  Anterior tibial artery appears to become diminutive in the lower leg.  Impression:            #1  No evidence of inflow or outflow stenosis            #2  On the right, there is two-vessel runoff via the peroneal and posterior tibial artery without any lesions amenable to intervention            #3  Dominant runoff on the left is the peroneal artery  Echo 04/01/2021:  1. Left ventricular ejection fraction, by estimation, is 55 to 60%. The  left ventricle has normal function. The left ventricle has no regional  wall motion abnormalities. Left ventricular diastolic parameters are  consistent with Grade II diastolic  dysfunction (pseudonormalization).   2. Right ventricular systolic function is normal. The right ventricular  size is normal.   3. Left atrial size was mildly dilated.   4. A small pericardial effusion is present.   5. The mitral valve is normal in structure. No evidence of mitral valve  regurgitation.   6. Tricuspid valve regurgitation is mild to moderate.   7. The aortic valve is normal in  structure. Aortic valve regurgitation is  not visualized.    EKG: EKG is personally reviewed.  06/24/22: EKG was not ordered.   Recent Labs: 01/24/2022: TSH 3.109 04/30/2022: ALT 13; Magnesium 2.1 05/03/2022: BUN 55; Creatinine, Ser 7.16; Hemoglobin 10.5; Platelets 146; Potassium 5.0; Sodium 135  Recent Lipid Panel    Component Value Date/Time   CHOL 85 01/26/2022 0555   TRIG 98 01/26/2022 0555   HDL 25 (L) 01/26/2022 0555   CHOLHDL 3.4 01/26/2022 0555   VLDL 20 01/26/2022 0555   LDLCALC 40 01/26/2022 0555     Physical Exam:    VS:  LMP 08/22/2015 (Approximate)     Wt Readings from Last 3 Encounters:  09/09/22 206 lb (93.4 kg)  07/27/22 205 lb (93 kg)  06/24/22 205 lb (93 kg)    VS:  LMP 08/22/2015 (Approximate)  , BMI There is no height or weight on file to calculate BMI. GENERAL:  Well appearing; In wheelchair HEENT: Pupils equal round and reactive, fundi not visualized, oral mucosa unremarkable NECK:  No jugular venous distention, waveform within normal limits, carotid upstroke brisk and symmetric, no bruits, no thyromegaly LUNGS:  Clear to auscultation bilaterally HEART:  RRR.  PMI not displaced or sustained,S1 and S2 within normal limits, no S3, no S4, no clicks, no rubs, VI/VI systolic murmur throughout ABD:  Flat, positive bowel sounds normal in frequency in pitch, no bruits, no rebound, no guarding, no midline pulsatile mass, no hepatomegaly, no splenomegaly EXT:  Unable to assess due to leg wrappings in place SKIN:  No rashes no nodules NEURO:  Cranial nerves II through XII grossly intact, motor grossly intact throughout PSYCH:  Cognitively intact, oriented to person place and time   ASSESSMENT:    No diagnosis found.  PLAN:    In order of problems listed above:  No problem-specific Assessment & Plan notes found for this encounter.          Follow up: 2 months  Medication Adjustments/Labs and Tests Ordered: Current medicines are reviewed at length with  the patient today.  Concerns regarding medicines are outlined above.  No orders of the defined types were placed in this encounter.  No orders of the defined types were placed in this encounter.   There are no Patient Instructions on file for this visit.    Signed, Skeet Latch, MD  09/12/2022 7:55 AM    [place]

## 2022-09-12 NOTE — Patient Instructions (Signed)
Medication Instructions:  START MIDODRINE 2.5 MG 1 TABLET 1 HOURS PRIOR TO DIALYSIS   *If you need a refill on your cardiac medications before your next appointment, please call your pharmacy*  Lab Work: LABS UPSTAIRS STE [place]   If you have labs (blood work) drawn today and your tests are completely normal, you will receive your results only by: [place] (if you have MyChart) OR A paper copy in the mail If you have any lab test that is abnormal or we need to change your treatment, we will call you to review the results.  Testing/Procedures: Your physician has recommended that you have a pulmonary function test. Pulmonary Function Tests are a group of tests that measure how well air moves in and out of your lungs.  Follow-Up: At [facility], you and your health needs are our priority.  As part of our continuing mission to provide you with exceptional heart care, we have created designated Provider Care Teams.  These Care Teams include your primary Cardiologist (physician) and Advanced Practice Providers (APPs -  Physician Assistants and Nurse Practitioners) who all work together to provide you with the care you need, when you need it.  We recommend signing up for the patient portal called "MyChart".  Sign up information is provided on this After Visit Summary.  MyChart is used to connect with patients for Virtual Visits (Telemedicine).  Patients are able to view lab/test results, encounter notes, upcoming appointments, etc.  Non-urgent messages can be sent to your provider as well.   To learn more about what you can do with MyChart, go to [email or web address].    Your next appointment:   2-3  month(s)  The format for your next appointment:   In Person  Provider:   Skeet Latch, MD

## 2022-09-12 NOTE — Assessment & Plan Note (Signed)
Pulmonary pressures were severely elevated on echo both initially and on repeat.  Now that she is getting regular dialysis this should not be due to volume overload.  We will check PFTs, ANA, rheumatoid factor, ANCA, anti-CCP, anti-SCL, and anti-SM.  She had a VQ scan that was negative.

## 2022-09-12 NOTE — Assessment & Plan Note (Signed)
Blood pressure continues to drop with dialysis.  Adding midodrine 2.5 mg 1 hour before as above.  Asked patient to track her blood pressures and bring to follow-up.

## 2022-09-13 ENCOUNTER — Telehealth (HOSPITAL_BASED_OUTPATIENT_CLINIC_OR_DEPARTMENT_OTHER): Payer: Self-pay | Admitting: Cardiovascular Disease

## 2022-09-13 NOTE — Telephone Encounter (Signed)
Patient calling in because she is unable to get the bp monitor. Was told to call our office back to see what can be done. Please advise

## 2022-09-13 NOTE — Telephone Encounter (Signed)
Hey did you order a prescription for BP for this patient?

## 2022-09-13 NOTE — Telephone Encounter (Signed)
Spoke with patient and she called multiple pharmacies and DME supply stores, no luck getting blood pressure machine Will reach out to DME stores listed on the Home BP Monitor Access Guide I have on Thursday when return to office, patient aware

## 2022-09-14 LAB — ANTI-SMITH ANTIBODY: ENA SM Ab Ser-aCnc: <0.2 AI (ref 0.0–0.9)

## 2022-09-14 LAB — ANCA PROFILE
Anti-MPO Antibodies: <0.2 units (ref 0.0–0.9)
Anti-PR3 Antibodies: <0.2 units (ref 0.0–0.9)
Atypical pANCA: <1:20 {titer}
C-ANCA: <1:20 {titer}
P-ANCA: <1:20 {titer}

## 2022-09-14 LAB — RHEUMATOID FACTOR: Rheumatoid fact SerPl-aCnc: <10 IU/mL (ref <14.0)

## 2022-09-14 LAB — CERULOPLASMIN: Ceruloplasmin: 23.7 mg/dL (ref 19.0–39.0)

## 2022-09-14 LAB — ANA: Anti Nuclear Antibody (ANA): NEGATIVE

## 2022-09-14 LAB — ANTI-SCLERODERMA ANTIBODY: Scleroderma (Scl-70) (ENA) Antibody, IgG: <0.2 AI (ref 0.0–0.9)

## 2022-09-15 MED ORDER — BLOOD PRESSURE MONITOR KIT: PACK | 0 refills | Status: DC

## 2022-09-15 NOTE — Telephone Encounter (Signed)
Spoke with [employer] and they will try to get covered Will print, have Dr Oval Linsey sign, and fax   Advised patient, verbalized understanding

## 2022-09-19 ENCOUNTER — Ambulatory Visit (HOSPITAL_COMMUNITY)
Admission: RE | Admit: 2022-09-19 | Discharge: 2022-09-19 | Disposition: A | Discharge status: Home / Self Care | Payer: Medicare Other | Source: Ambulatory Visit | Attending: Family | Admitting: Family

## 2022-09-19 DIAGNOSIS — I272 Pulmonary hypertension, unspecified: Secondary | ICD-10-CM | POA: Insufficient documentation

## 2022-09-19 LAB — PULMONARY FUNCTION TEST
DL/VA % pred: 87 %
DL/VA: 3.73 ml/min/mmHg/L
DLCO unc % pred: 50 %
DLCO unc: 11.37 ml/min/mmHg
FEF 25-75 Post: 1.99 L/sec
FEF 25-75 Pre: 2.28 L/sec
FEF2575-%Change-Post: -12 %
FEF2575-%Pred-Post: 68 %
FEF2575-%Pred-Pre: 78 %
FEV1-%Change-Post: -4 %
FEV1-%Pred-Post: 59 %
FEV1-%Pred-Pre: 62 %
FEV1-Post: 1.79 L
FEV1-Pre: 1.88 L
FEV1FVC-%Change-Post: 3 %
FEV1FVC-%Pred-Pre: 109 %
FEV6-%Change-Post: -8 %
FEV6-%Pred-Post: 52 %
FEV6-%Pred-Pre: 57 %
FEV6-Post: 1.95 L
FEV6-Pre: 2.14 L
FEV6FVC-%Change-Post: 0 %
FEV6FVC-%Pred-Post: 102 %
FEV6FVC-%Pred-Pre: 102 %
FVC-%Change-Post: -7 %
FVC-%Pred-Post: 51 %
FVC-%Pred-Pre: 56 %
FVC-Post: 1.97 L
FVC-Pre: 2.14 L
Post FEV1/FVC ratio: 91 %
Post FEV6/FVC ratio: 100 %
Pre FEV1/FVC ratio: 88 %
Pre FEV6/FVC Ratio: 100 %

## 2022-09-19 MED ORDER — ALBUTEROL SULFATE (2.5 MG/3ML) 0.083% IN NEBU
2.5000 mg | INHALATION_SOLUTION | Freq: Once | RESPIRATORY_TRACT | Status: AC
  Administered 2022-09-19: 2.5 mg via RESPIRATORY_TRACT

## 2022-09-27 ENCOUNTER — Telehealth (HOSPITAL_BASED_OUTPATIENT_CLINIC_OR_DEPARTMENT_OTHER): Payer: Self-pay | Admitting: *Deleted

## 2022-09-27 DIAGNOSIS — I2721 Secondary pulmonary arterial hypertension: Secondary | ICD-10-CM

## 2022-09-27 DIAGNOSIS — R942 Abnormal results of pulmonary function studies: Secondary | ICD-10-CM

## 2022-09-27 NOTE — Telephone Encounter (Signed)
Advised patient and referral placed  

## 2022-09-27 NOTE — Telephone Encounter (Signed)
-----   Message from Skeet Latch, MD sent at 09/26/2022  6:07 PM EST ----- Her pulmonary function tests are abnormal which may be why the pressures in her lungs are elevated.  Recommend that she see pulmonology, preferably Dr. Lake Bells to further work-up for pulmonary hypertension and abnormal PFTs.

## 2022-09-28 ENCOUNTER — Ambulatory Visit: Payer: Medicare Other | Admitting: Family

## 2022-09-30 ENCOUNTER — Encounter (HOSPITAL_COMMUNITY): Payer: Self-pay

## 2022-09-30 ENCOUNTER — Telehealth: Payer: Self-pay | Admitting: Orthopedic Surgery

## 2022-09-30 ENCOUNTER — Other Ambulatory Visit: Payer: Self-pay

## 2022-09-30 ENCOUNTER — Emergency Department (HOSPITAL_COMMUNITY): Payer: Medicare Other

## 2022-09-30 ENCOUNTER — Ambulatory Visit: Payer: Medicare Other | Admitting: Family

## 2022-09-30 ENCOUNTER — Inpatient Hospital Stay (HOSPITAL_COMMUNITY)
Admission: EM | Admit: 2022-09-30 | Discharge: 2022-10-02 | DRG: 377 | Disposition: A | Discharge status: Home / Self Care | Payer: Medicare Other | Attending: Internal Medicine | Admitting: Internal Medicine

## 2022-09-30 DIAGNOSIS — I272 Pulmonary hypertension, unspecified: Secondary | ICD-10-CM | POA: Diagnosis present

## 2022-09-30 DIAGNOSIS — E1122 Type 2 diabetes mellitus with diabetic chronic kidney disease: Secondary | ICD-10-CM | POA: Diagnosis present

## 2022-09-30 DIAGNOSIS — Z79890 Hormone replacement therapy (postmenopausal): Secondary | ICD-10-CM

## 2022-09-30 DIAGNOSIS — Z79899 Other long term (current) drug therapy: Secondary | ICD-10-CM

## 2022-09-30 DIAGNOSIS — Z794 Long term (current) use of insulin: Secondary | ICD-10-CM

## 2022-09-30 DIAGNOSIS — I132 Hypertensive heart and chronic kidney disease with heart failure and with stage 5 chronic kidney disease, or end stage renal disease: Secondary | ICD-10-CM | POA: Diagnosis present

## 2022-09-30 DIAGNOSIS — E785 Hyperlipidemia, unspecified: Secondary | ICD-10-CM | POA: Diagnosis present

## 2022-09-30 DIAGNOSIS — D638 Anemia in other chronic diseases classified elsewhere: Secondary | ICD-10-CM | POA: Diagnosis present

## 2022-09-30 DIAGNOSIS — D631 Anemia in chronic kidney disease: Secondary | ICD-10-CM | POA: Diagnosis present

## 2022-09-30 DIAGNOSIS — Z8249 Family history of ischemic heart disease and other diseases of the circulatory system: Secondary | ICD-10-CM

## 2022-09-30 DIAGNOSIS — Z992 Dependence on renal dialysis: Secondary | ICD-10-CM

## 2022-09-30 DIAGNOSIS — I5032 Chronic diastolic (congestive) heart failure: Secondary | ICD-10-CM | POA: Diagnosis present

## 2022-09-30 DIAGNOSIS — K264 Chronic or unspecified duodenal ulcer with hemorrhage: Principal | ICD-10-CM | POA: Diagnosis present

## 2022-09-30 DIAGNOSIS — N186 End stage renal disease: Secondary | ICD-10-CM | POA: Diagnosis present

## 2022-09-30 DIAGNOSIS — I10 Essential (primary) hypertension: Secondary | ICD-10-CM | POA: Diagnosis present

## 2022-09-30 DIAGNOSIS — Z833 Family history of diabetes mellitus: Secondary | ICD-10-CM

## 2022-09-30 DIAGNOSIS — L8990 Pressure ulcer of unspecified site, unspecified stage: Secondary | ICD-10-CM | POA: Insufficient documentation

## 2022-09-30 DIAGNOSIS — Z888 Allergy status to other drugs, medicaments and biological substances status: Secondary | ICD-10-CM

## 2022-09-30 DIAGNOSIS — Z841 Family history of disorders of kidney and ureter: Secondary | ICD-10-CM

## 2022-09-30 DIAGNOSIS — Z89421 Acquired absence of other right toe(s): Secondary | ICD-10-CM

## 2022-09-30 DIAGNOSIS — D62 Acute posthemorrhagic anemia: Secondary | ICD-10-CM | POA: Diagnosis present

## 2022-09-30 DIAGNOSIS — K922 Gastrointestinal hemorrhage, unspecified: Secondary | ICD-10-CM | POA: Diagnosis present

## 2022-09-30 DIAGNOSIS — K219 Gastro-esophageal reflux disease without esophagitis: Secondary | ICD-10-CM | POA: Diagnosis present

## 2022-09-30 DIAGNOSIS — H409 Unspecified glaucoma: Secondary | ICD-10-CM | POA: Diagnosis present

## 2022-09-30 DIAGNOSIS — M898X9 Other specified disorders of bone, unspecified site: Secondary | ICD-10-CM | POA: Diagnosis present

## 2022-09-30 DIAGNOSIS — L89893 Pressure ulcer of other site, stage 3: Secondary | ICD-10-CM | POA: Diagnosis present

## 2022-09-30 DIAGNOSIS — E039 Hypothyroidism, unspecified: Secondary | ICD-10-CM | POA: Diagnosis present

## 2022-09-30 DIAGNOSIS — H5461 Unqualified visual loss, right eye, normal vision left eye: Secondary | ICD-10-CM | POA: Diagnosis present

## 2022-09-30 DIAGNOSIS — E119 Type 2 diabetes mellitus without complications: Secondary | ICD-10-CM | POA: Diagnosis present

## 2022-09-30 DIAGNOSIS — K6289 Other specified diseases of anus and rectum: Secondary | ICD-10-CM | POA: Diagnosis present

## 2022-09-30 DIAGNOSIS — R935 Abnormal findings on diagnostic imaging of other abdominal regions, including retroperitoneum: Secondary | ICD-10-CM

## 2022-09-30 LAB — COMPREHENSIVE METABOLIC PANEL
ALT: 13 U/L (ref 0–44)
AST: 14 U/L — ABNORMAL LOW (ref 15–41)
Albumin: 3.9 g/dL (ref 3.5–5.0)
Alkaline Phosphatase: 130 U/L — ABNORMAL HIGH (ref 38–126)
Anion gap: 10 (ref 5–15)
BUN: 39 mg/dL — ABNORMAL HIGH (ref 6–20)
CO2: 29 mmol/L (ref 22–32)
Calcium: 9.3 mg/dL (ref 8.9–10.3)
Chloride: 96 mmol/L — ABNORMAL LOW (ref 98–111)
Creatinine, Ser: 5.45 mg/dL — ABNORMAL HIGH (ref 0.44–1.00)
GFR, Estimated: 9 mL/min — ABNORMAL LOW (ref >60)
Glucose, Bld: 184 mg/dL — ABNORMAL HIGH (ref 70–99)
Potassium: 4.3 mmol/L (ref 3.5–5.1)
Sodium: 135 mmol/L (ref 135–145)
Total Bilirubin: 0.9 mg/dL (ref 0.3–1.2)
Total Protein: 7.3 g/dL (ref 6.5–8.1)

## 2022-09-30 LAB — TYPE AND SCREEN
ABO/RH(D): O POS
Antibody Screen: NEGATIVE

## 2022-09-30 LAB — CBC
HCT: 36.6 % (ref 36.0–46.0)
Hemoglobin: 11.7 g/dL — ABNORMAL LOW (ref 12.0–15.0)
MCH: 30.2 pg (ref 26.0–34.0)
MCHC: 32 g/dL (ref 30.0–36.0)
MCV: 94.6 fL (ref 80.0–100.0)
Platelets: 142 10*3/uL — ABNORMAL LOW (ref 150–400)
RBC: 3.87 MIL/uL (ref 3.87–5.11)
RDW: 13.8 % (ref 11.5–15.5)
WBC: 6.5 10*3/uL (ref 4.0–10.5)
nRBC: 0 % (ref 0.0–0.2)

## 2022-09-30 LAB — POC OCCULT BLOOD, ED: Fecal Occult Bld: POSITIVE — AB

## 2022-09-30 MED ORDER — IOHEXOL 350 MG/ML SOLN
100.0000 mL | Freq: Once | INTRAVENOUS | Status: AC | PRN
  Administered 2022-10-01: 100 mL via INTRAVENOUS

## 2022-09-30 MED ORDER — MIDODRINE HCL 5 MG PO TABS: 5.0000 mg | ORAL_TABLET | Freq: Three times a day (TID) | ORAL | Status: DC

## 2022-09-30 MED ORDER — LACTATED RINGERS IV BOLUS
1000.0000 mL | Freq: Once | INTRAVENOUS | Status: AC
  Administered 2022-09-30: 1000 mL via INTRAVENOUS

## 2022-09-30 MED ORDER — FENTANYL CITRATE PF 50 MCG/ML IJ SOSY: 50.0000 ug | PREFILLED_SYRINGE | Freq: Once | INTRAMUSCULAR | Status: DC | PRN

## 2022-09-30 MED ORDER — PANTOPRAZOLE SODIUM 40 MG IV SOLR
40.0000 mg | Freq: Once | INTRAVENOUS | Status: AC
  Administered 2022-09-30: 40 mg via INTRAVENOUS
  Filled 2022-09-30: qty 10

## 2022-09-30 NOTE — Telephone Encounter (Signed)
Pt called requesting if she can see Dr [name] on Monday for a dressing change. She canceled appt for today with PA Erin. Please call pt about this matter at

## 2022-09-30 NOTE — ED Triage Notes (Signed)
Pt states she has had two bloody stools tonight, reports they are bright red. Does report she has had this issue in the past due to polyps.

## 2022-09-30 NOTE — Telephone Encounter (Signed)
Can you please call pt and make an ppt with Erin for next week?

## 2022-09-30 NOTE — ED Provider Notes (Signed)
[facility] EMERGENCY DEPARTMENT Provider Note  CSN: [id number] Arrival date & time: 09/30/22 1740  Chief Complaint(s) Hematochezia  HPI Amy Moses is a 51 y.o. female with PMH ESRD on hemodialysis Tuesday Thursday Saturday, CHF, hypothyroidism, HTN, pulmonary hypertension, pericardial effusion, peptic ulcer disease with gastroduodenal erosions and a polyp, previous hospital admission after colonoscopy and polyp removal for recurrent GI bleeding who presents emergency department for evaluation of GI bleeding.  Patient currently not on a blood thinner but states that she has had multiple episodes of bright red blood per rectum tonight.  Denies fatigue, chest pain, shortness of breath, nausea, vomiting or other systemic symptoms.  She does endorse some mild crampy lower abdominal pain.   Past Medical History Past Medical History:  Diagnosis Date   Anemia    Blindness of right eye with low vision in contralateral eye    s/p victrectomy   Chronic diastolic heart failure ([place]) 03/30/2021   Diabetes mellitus, type II ([place])    Dyslipidemia    Glaucoma    History of blood transfusion    Hypertension    Hypothyroidism (acquired)    Kidney disease    Stage 5   Pneumonia    Pulmonary hypertension, unspecified ([place]) 09/12/2022   Patient Active Problem List   Diagnosis Date Noted   Pulmonary hypertension, unspecified ([place]) 09/12/2022   GERD (gastroesophageal reflux disease) 04/30/2022   Bacteremia 04/29/2022   Non-adherence to medical treatment 03/16/2022   Cutaneous abscess of right foot    Osteomyelitis of foot ([place]) 02/14/2022   Obesity (BMI 30-39.9) 01/26/2022   Ischemic ulcer of right foot ([place]) 01/24/2022   Normocytic anemia 01/24/2022   Sacral pressure ulcer 49/44/9675   Metabolic acidosis, increased anion gap 01/24/2022   Leg wound, left, sequela 12/10/2021   Open leg wound 09/03/2021   Wound infection    Non-pressure chronic ulcer of right calf limited to breakdown of  skin ([place])    Calciphylaxis of right lower extremity with nonhealing ulcer, limited to breakdown of skin ([place])    Chronic ulcer of left thigh ([place]) 08/07/2021   Calciphylaxis of left lower extremity with nonhealing ulcer with necrosis of muscle ([place]) 08/07/2021   Lower GI bleed 05/12/2021   Acute GI bleeding 04/24/2021   Acute blood loss anemia 04/23/2021   GI bleed 04/22/2021   Fever 03/31/2021   Chronic diastolic heart failure ([place]) 03/30/2021   ESRD (end stage renal disease) on dialysis ([place]) 11/16/2020   Calciphylaxis 11/06/2020   Non-healing open wound of heel 11/03/2020   Diabetic foot infection ([place]) 11/01/2020   Decubitus ulcer, heel 11/01/2020   Closed nondisplaced fracture of left patella 91/63/8466   Metabolic acidosis 59/93/5701   Acute on chronic renal failure ([place]) 06/10/2020   Acute pericardial effusion 06/10/2020   Chronic kidney disease, stage 4 (severe) ([place]) 03/05/2019   Vitamin D deficiency 01/28/2019   Type 2 diabetes mellitus with ESRD (end-stage renal disease) ([place]) 09/21/2015   Mixed hyperlipidemia 09/21/2015   Essential hypertension 09/21/2015   Acquired hypothyroidism 09/21/2015   Iris bomb 07/31/2012   Secondary angle-closure glaucoma 07/31/2012   Home Medication(s) Prior to Admission medications   Medication Sig Start Date End Date Taking? Authorizing Provider  acetaminophen (TYLENOL) 500 MG tablet Take 1,000 mg by mouth every 6 (six) hours as needed for moderate pain.    [provider]  atorvastatin (LIPITOR) 10 MG tablet Take 1 tablet (10 mg total) by mouth daily. Patient taking differently: Take 10 mg by mouth at bedtime. 04/25/21  Roxan Hockey, MD  AURYXIA 1 GM 210 MG(Fe) tablet Take 420 mg by mouth 2 (two) times daily with a meal. 01/07/22   [provider]  Blood Glucose Monitoring Suppl (ACCU-CHEK GUIDE ME) w/Device KIT 1 Piece by Does not apply route as directed. 03/16/22   Cassandria Anger, MD  Blood Pressure Monitor  KIT TAKE BLOOD PRESSURE DAILY 09/15/22   Skeet Latch, MD  Continuous Blood Gluc Receiver (FREESTYLE LIBRE 2 READER) DEVI As directed 04/06/22   Cassandria Anger, MD  Continuous Blood Gluc Sensor (FREESTYLE LIBRE 2 SENSOR) MISC 1 Piece by Does not apply route every 14 (fourteen) days. 04/06/22   Cassandria Anger, MD  glucose blood (ACCU-CHEK GUIDE) test strip Use as instructed 03/16/22   Cassandria Anger, MD  HUMALOG KWIKPEN 100 UNIT/ML KwikPen Inject 5-11 Units into the skin 3 (three) times daily with meals. If eats 50% or more of meal. 03/16/22   Nida, Marella Chimes, MD  insulin glargine (LANTUS) 100 UNIT/ML injection Inject 0.2 mLs (20 Units total) into the skin at bedtime. 04/06/22   Cassandria Anger, MD  levothyroxine (SYNTHROID) 75 MCG tablet Take 75 mcg by mouth daily. 06/13/22   [provider]  midodrine (PROAMATINE) 2.5 MG tablet TAKE 1 TABLET 1 HOUR PRIOR TO DIALYSIS 09/12/22   Skeet Latch, MD  pantoprazole (PROTONIX) 40 MG tablet Take 1 tablet (40 mg total) by mouth 2 (two) times daily. 04/25/21 09/12/22  Roxan Hockey, MD  polyethylene glycol (MIRALAX / GLYCOLAX) 17 g packet Take 17 g by mouth daily as needed for mild constipation. 02/19/22   Nita Sells, MD  silver sulfADIAZINE (SILVADENE) 1 % cream Apply 1 application. topically daily. Apply to affected area daily plus dry dressing 02/24/22   Newt Minion, MD  torsemide (DEMADEX) 100 MG tablet Take 100 mg by mouth daily. 08/05/22   [provider]  Vitamin D, Ergocalciferol, (DRISDOL) 1.25 MG (50000 UNIT) CAPS capsule Take 50,000 Units by mouth every 7 (seven) days. 04/20/21   [provider]                                                                                                                                    Past Surgical History Past Surgical History:  Procedure Laterality Date   ABDOMINAL AORTOGRAM W/LOWER EXTREMITY Bilateral 12/18/2020   Procedure: ABDOMINAL  AORTOGRAM W/LOWER EXTREMITY;  Surgeon: Elam Dutch, MD;  Location: [facility];  Service: Cardiovascular;  Laterality: Bilateral;   ABDOMINAL AORTOGRAM W/LOWER EXTREMITY Bilateral 01/25/2022   Procedure: ABDOMINAL AORTOGRAM W/LOWER EXTREMITY;  Surgeon: Serafina Mitchell, MD;  Location: [facility];  Service: Cardiovascular;  Laterality: Bilateral;   AMPUTATION Right 02/16/2022   Procedure: RIGHT FOOT 5TH RAY AMPUTATION;  Surgeon: Newt Minion, MD;  Location: [facility];  Service: Orthopedics;  Laterality: Right;   ANKLE FRACTURE SURGERY Right    AV FISTULA PLACEMENT Left  08/18/2020   Procedure: LEFT ARM BRACHIOCEPHALIC ARTERIOVENOUS (AV) FISTULA CREATION;  Surgeon: Elam Dutch, MD;  Location: [facility];  Service: Vascular;  Laterality: Left;   BIOPSY  04/24/2021   Procedure: BIOPSY;  Surgeon: Eloise Harman, DO;  [facility];  Service: Endoscopy;;   CESAREAN SECTION     CHOLECYSTECTOMY     COLONOSCOPY  04/24/2021   Surgeon: Eloise Harman, DO;  nonbleeding internal hemorrhoids, 1 large (25 mm) pedunculated transverse colon polyp (prolapse type polyp) with adherent clot and stigmata of recent bleed.   COLONOSCOPY WITH PROPOFOL N/A 05/14/2021   Procedure: COLONOSCOPY WITH PROPOFOL;  Surgeon: Daneil Dolin, MD;  [facility];  Service: Endoscopy;  Laterality: N/A;   ESOPHAGOGASTRODUODENOSCOPY (EGD) WITH PROPOFOL N/A 04/24/2021   Surgeon: Eloise Harman, DO;  duodenal erosions and gastritis biopsied (pathology with peptic duodenitis, reactive gastropathy with erosions/chronic inflammation, negative for H. pylori)   EYE SURGERY     Vatrectomy   HEMOSTASIS CLIP PLACEMENT  05/14/2021   Procedure: HEMOSTASIS CLIP PLACEMENT;  Surgeon: Daneil Dolin, MD;  [facility];  Service: Endoscopy;;   IR PERC TUN PERIT CATH WO PORT S&I /IMAG  09/15/2020   IR REMOVAL TUN CV CATH W/O FL  02/19/2021   IR US GUIDE VASC ACCESS RIGHT  09/15/2020    POLYPECTOMY  04/24/2021   Procedure: POLYPECTOMY;  Surgeon: Eloise Harman, DO;  [facility];  Service: Endoscopy;;   POLYPECTOMY  05/14/2021   Procedure: POLYPECTOMY;  Surgeon: Daneil Dolin, MD;  [facility];  Service: Endoscopy;;   SKIN SPLIT GRAFT Bilateral 09/03/2021   Procedure: SKIN GRAFT BILATERAL LEGS;  Surgeon: Newt Minion, MD;  Location: [facility];  Service: Orthopedics;  Laterality: Bilateral;   SKIN SPLIT GRAFT Left 12/10/2021   Procedure: IRRIGATION AND DEBRIDEMENT LEFT CALF, APPLICATION SPLIT THICKNESS SKIN GRAFT;  Surgeon: Newt Minion, MD;  Location: [facility];  Service: Orthopedics;  Laterality: Left;   TOE SURGERY     Family History Family History  Problem Relation Age of Onset   Heart failure Mother    Heart disease Mother    Diabetes Mother    Kidney disease Mother    Heart failure Father    Diabetes Father    Heart disease Father    Diabetes Brother    Heart failure Maternal Grandmother    Heart failure Maternal Grandfather    Transient ischemic attack Maternal Grandfather    Colon cancer Neg Hx     Social History Social History   Tobacco Use   Smoking status: Never   Smokeless tobacco: Never  Vaping Use   Vaping Use: Never used  Substance Use Topics   Alcohol use: No   Drug use: No   Allergies Ace inhibitors  Review of Systems Review of Systems  Gastrointestinal:  Positive for abdominal pain and blood in stool.    Physical Exam Vital Signs  I have reviewed the triage vital signs BP 91/60 (BP Location: Right Arm)   Pulse 70   Temp (!) 97.5 F (36.4 C) (Oral)   Resp 16   Ht _0  (1.676 m)   Wt 96 kg   LMP 08/22/2015 (Approximate)   SpO2 99%   BMI 34.16 kg/m   Physical Exam Vitals and nursing note reviewed.  Constitutional:      General: She is not in acute distress.    Appearance: She is well-developed.  HENT:  Head: Normocephalic and atraumatic.  Eyes:     Conjunctiva/sclera: Conjunctivae normal.   Cardiovascular:     Rate and Rhythm: Normal rate and regular rhythm.     Heart sounds: No murmur heard. Pulmonary:     Effort: Pulmonary effort is normal. No respiratory distress.     Breath sounds: Normal breath sounds.  Abdominal:     Palpations: Abdomen is soft.     Tenderness: There is abdominal tenderness.  Musculoskeletal:        General: No swelling.     Cervical back: Neck supple.  Skin:    General: Skin is warm and dry.     Capillary Refill: Capillary refill takes less than 2 seconds.  Neurological:     Mental Status: She is alert.  Psychiatric:        Mood and Affect: Mood normal.     ED Results and Treatments Labs (all labs ordered are listed, but only abnormal results are displayed) Labs Reviewed  COMPREHENSIVE METABOLIC PANEL - Abnormal; Notable for the following components:      Result Value   Chloride 96 (*)    Glucose, Bld 184 (*)    BUN 39 (*)    Creatinine, Ser 5.45 (*)    AST 14 (*)    Alkaline Phosphatase 130 (*)    GFR, Estimated 9 (*)    All other components within normal limits  CBC - Abnormal; Notable for the following components:   Hemoglobin 11.7 (*)    Platelets 142 (*)    All other components within normal limits  POC OCCULT BLOOD, ED - Abnormal; Notable for the following components:   Fecal Occult Bld POSITIVE (*)    All other components within normal limits  TYPE AND SCREEN                                                                                                                          Radiology No results found.  Pertinent labs & imaging results that were available during my care of the patient were reviewed by me and considered in my medical decision making (see MDM for details).  Medications Ordered in ED Medications - No data to display                                                                                                                                   Procedures Procedures  (  including critical care  time)  Medical Decision Making / ED Course   This patient presents to the ED for concern of blood in stool, this involves an extensive number of treatment options, and is a complaint that carries with it a high risk of complications and morbidity.  The differential diagnosis includes upper GI bleed, lower GI bleed, hemorrhoidal bleeding, C. difficile, proctitis  MDM: Patient seen in the emergency room for evaluation of GI bleed.  Physical exam with some mild tenderness in the right left lower quadrants but is otherwise unremarkable.  Laboratory evaluation with a BUN of 39, creatinine 5.45 consistent with her history of ESRD, hemoglobin 11.7 which is stable for this patient and patient is thrombocytopenic to 142.Marland Kitchen  Stool fecal occult positive and C. difficile is pending.  CT angio GI bleed without evidence of acute GI bleed but does show a thickened appearance to the rectum hepatomegaly and a small pericardial effusion which is known to this patient.  I spoke with Dr. Abbey Chatters of GI who will see the patient in the morning.  PPI initiated and patient admitted to the hospitalist for GI bleed.   Additional history obtained: -Additional history obtained from caretaker -External records from outside source obtained and reviewed including: Chart review including previous notes, labs, imaging, consultation notes   Lab Tests: -I ordered, reviewed, and interpreted labs.   The pertinent results include:   Labs Reviewed  COMPREHENSIVE METABOLIC PANEL - Abnormal; Notable for the following components:      Result Value   Chloride 96 (*)    Glucose, Bld 184 (*)    BUN 39 (*)    Creatinine, Ser 5.45 (*)    AST 14 (*)    Alkaline Phosphatase 130 (*)    GFR, Estimated 9 (*)    All other components within normal limits  CBC - Abnormal; Notable for the following components:   Hemoglobin 11.7 (*)    Platelets 142 (*)    All other components within normal limits  POC OCCULT BLOOD, ED - Abnormal; Notable  for the following components:   Fecal Occult Bld POSITIVE (*)    All other components within normal limits  TYPE AND SCREEN     Imaging Studies ordered: I ordered imaging studies including CT  angio GI bleed I independently visualized and interpreted imaging. I agree with the radiologist interpretation   Medicines ordered and prescription drug management: No orders of the defined types were placed in this encounter.   -I have reviewed the patients home medicines and have made adjustments as needed  Critical interventions none  Consultations Obtained: I requested consultation with the gastroenterologist Dr. Abbey Chatters,  and discussed lab and imaging findings as well as pertinent plan - they recommend: Admission and PPI   Cardiac Monitoring: The patient was maintained on a cardiac monitor.  I personally viewed and interpreted the cardiac monitored which showed an underlying rhythm of: NSR  Social Determinants of Health:  Factors impacting patients care include: none   Reevaluation: After the interventions noted above, I reevaluated the patient and found that they have :improved  Co morbidities that complicate the patient evaluation  Past Medical History:  Diagnosis Date   Anemia    Blindness of right eye with low vision in contralateral eye    s/p victrectomy   Chronic diastolic heart failure ([place]) 03/30/2021   Diabetes mellitus, type II ([place])    Dyslipidemia    Glaucoma    History of blood transfusion  Hypertension    Hypothyroidism (acquired)    Kidney disease    Stage 5   Pneumonia    Pulmonary hypertension, unspecified ([place]) 09/12/2022      Dispostion: I considered admission for this patient, and due to persistent GI bleeding, patient require hospital admission     Final Clinical Impression(s) / ED Diagnoses Final diagnoses:  None     _0 @    Teressa Lower, MD 10/01/22 [phone]

## 2022-09-30 NOTE — Telephone Encounter (Signed)
Pt called requesting if she can see Dr [name] on Monday for a dressing change. She canceled appt for today with PA Erin. Please call pt about this matter at [phone].

## 2022-10-01 DIAGNOSIS — I132 Hypertensive heart and chronic kidney disease with heart failure and with stage 5 chronic kidney disease, or end stage renal disease: Secondary | ICD-10-CM | POA: Diagnosis present

## 2022-10-01 DIAGNOSIS — D62 Acute posthemorrhagic anemia: Secondary | ICD-10-CM | POA: Diagnosis present

## 2022-10-01 DIAGNOSIS — R935 Abnormal findings on diagnostic imaging of other abdominal regions, including retroperitoneum: Secondary | ICD-10-CM | POA: Diagnosis not present

## 2022-10-01 DIAGNOSIS — I5032 Chronic diastolic (congestive) heart failure: Secondary | ICD-10-CM | POA: Diagnosis present

## 2022-10-01 DIAGNOSIS — K6289 Other specified diseases of anus and rectum: Secondary | ICD-10-CM | POA: Diagnosis present

## 2022-10-01 DIAGNOSIS — I10 Essential (primary) hypertension: Secondary | ICD-10-CM

## 2022-10-01 DIAGNOSIS — L89323 Pressure ulcer of left buttock, stage 3: Secondary | ICD-10-CM

## 2022-10-01 DIAGNOSIS — E1122 Type 2 diabetes mellitus with diabetic chronic kidney disease: Secondary | ICD-10-CM | POA: Diagnosis not present

## 2022-10-01 DIAGNOSIS — Z833 Family history of diabetes mellitus: Secondary | ICD-10-CM | POA: Diagnosis not present

## 2022-10-01 DIAGNOSIS — Z841 Family history of disorders of kidney and ureter: Secondary | ICD-10-CM | POA: Diagnosis not present

## 2022-10-01 DIAGNOSIS — Z89421 Acquired absence of other right toe(s): Secondary | ICD-10-CM | POA: Diagnosis not present

## 2022-10-01 DIAGNOSIS — K264 Chronic or unspecified duodenal ulcer with hemorrhage: Secondary | ICD-10-CM | POA: Diagnosis present

## 2022-10-01 DIAGNOSIS — Z888 Allergy status to other drugs, medicaments and biological substances status: Secondary | ICD-10-CM | POA: Diagnosis not present

## 2022-10-01 DIAGNOSIS — Z992 Dependence on renal dialysis: Secondary | ICD-10-CM

## 2022-10-01 DIAGNOSIS — L89893 Pressure ulcer of other site, stage 3: Secondary | ICD-10-CM | POA: Diagnosis present

## 2022-10-01 DIAGNOSIS — H409 Unspecified glaucoma: Secondary | ICD-10-CM | POA: Diagnosis present

## 2022-10-01 DIAGNOSIS — E039 Hypothyroidism, unspecified: Secondary | ICD-10-CM | POA: Diagnosis not present

## 2022-10-01 DIAGNOSIS — K922 Gastrointestinal hemorrhage, unspecified: Secondary | ICD-10-CM

## 2022-10-01 DIAGNOSIS — N186 End stage renal disease: Secondary | ICD-10-CM

## 2022-10-01 DIAGNOSIS — L8990 Pressure ulcer of unspecified site, unspecified stage: Secondary | ICD-10-CM | POA: Insufficient documentation

## 2022-10-01 DIAGNOSIS — Z794 Long term (current) use of insulin: Secondary | ICD-10-CM | POA: Diagnosis not present

## 2022-10-01 DIAGNOSIS — Z79899 Other long term (current) drug therapy: Secondary | ICD-10-CM | POA: Diagnosis not present

## 2022-10-01 DIAGNOSIS — D631 Anemia in chronic kidney disease: Secondary | ICD-10-CM | POA: Diagnosis present

## 2022-10-01 DIAGNOSIS — I272 Pulmonary hypertension, unspecified: Secondary | ICD-10-CM | POA: Diagnosis present

## 2022-10-01 DIAGNOSIS — E785 Hyperlipidemia, unspecified: Secondary | ICD-10-CM | POA: Diagnosis present

## 2022-10-01 DIAGNOSIS — H5461 Unqualified visual loss, right eye, normal vision left eye: Secondary | ICD-10-CM | POA: Diagnosis present

## 2022-10-01 DIAGNOSIS — Z8249 Family history of ischemic heart disease and other diseases of the circulatory system: Secondary | ICD-10-CM | POA: Diagnosis not present

## 2022-10-01 DIAGNOSIS — K219 Gastro-esophageal reflux disease without esophagitis: Secondary | ICD-10-CM | POA: Diagnosis not present

## 2022-10-01 LAB — PROTIME-INR
INR: 1.1 (ref 0.8–1.2)
Prothrombin Time: 13.7 seconds (ref 11.4–15.2)

## 2022-10-01 LAB — COMPREHENSIVE METABOLIC PANEL
ALT: 10 U/L (ref 0–44)
AST: 12 U/L — ABNORMAL LOW (ref 15–41)
Albumin: 3.3 g/dL — ABNORMAL LOW (ref 3.5–5.0)
Alkaline Phosphatase: 114 U/L (ref 38–126)
Anion gap: 11 (ref 5–15)
BUN: 41 mg/dL — ABNORMAL HIGH (ref 6–20)
CO2: 26 mmol/L (ref 22–32)
Calcium: 8.7 mg/dL — ABNORMAL LOW (ref 8.9–10.3)
Chloride: 98 mmol/L (ref 98–111)
Creatinine, Ser: 5.8 mg/dL — ABNORMAL HIGH (ref 0.44–1.00)
GFR, Estimated: 8 mL/min — ABNORMAL LOW (ref >60)
Glucose, Bld: 182 mg/dL — ABNORMAL HIGH (ref 70–99)
Potassium: 3.8 mmol/L (ref 3.5–5.1)
Sodium: 135 mmol/L (ref 135–145)
Total Bilirubin: 0.7 mg/dL (ref 0.3–1.2)
Total Protein: 6.1 g/dL — ABNORMAL LOW (ref 6.5–8.1)

## 2022-10-01 LAB — CBC
HCT: 32.1 % — ABNORMAL LOW (ref 36.0–46.0)
Hemoglobin: 10.2 g/dL — ABNORMAL LOW (ref 12.0–15.0)
MCH: 30 pg (ref 26.0–34.0)
MCHC: 31.8 g/dL (ref 30.0–36.0)
MCV: 94.4 fL (ref 80.0–100.0)
Platelets: 111 10*3/uL — ABNORMAL LOW (ref 150–400)
RBC: 3.4 MIL/uL — ABNORMAL LOW (ref 3.87–5.11)
RDW: 13.8 % (ref 11.5–15.5)
WBC: 5.2 10*3/uL (ref 4.0–10.5)
nRBC: 0 % (ref 0.0–0.2)

## 2022-10-01 LAB — PHOSPHORUS: Phosphorus: 6.6 mg/dL — ABNORMAL HIGH (ref 2.5–4.6)

## 2022-10-01 LAB — HEMOGLOBIN A1C
Hgb A1c MFr Bld: 9.4 % — ABNORMAL HIGH (ref 4.8–5.6)
Mean Plasma Glucose: 223.08 mg/dL

## 2022-10-01 LAB — MAGNESIUM: Magnesium: 2.4 mg/dL (ref 1.7–2.4)

## 2022-10-01 LAB — GLUCOSE, CAPILLARY
Glucose-Capillary: 177 mg/dL — ABNORMAL HIGH (ref 70–99)
Glucose-Capillary: 112 mg/dL — ABNORMAL HIGH (ref 70–99)
Glucose-Capillary: 187 mg/dL — ABNORMAL HIGH (ref 70–99)

## 2022-10-01 LAB — HEPATITIS B SURFACE ANTIGEN: Hepatitis B Surface Ag: NONREACTIVE

## 2022-10-01 LAB — HIV ANTIBODY (ROUTINE TESTING W REFLEX): HIV Screen 4th Generation wRfx: NONREACTIVE

## 2022-10-01 LAB — C DIFFICILE QUICK SCREEN W PCR REFLEX
C Diff antigen: NEGATIVE
C Diff interpretation: NOT DETECTED
C Diff toxin: NEGATIVE

## 2022-10-01 MED ORDER — INSULIN DETEMIR 100 UNIT/ML ~~LOC~~ SOLN
13.0000 [IU] | Freq: Every day | SUBCUTANEOUS | Status: DC
  Administered 2022-10-01: 13 [IU] via SUBCUTANEOUS
  Filled 2022-10-01 (×2): qty 0.13

## 2022-10-01 MED ORDER — PANTOPRAZOLE SODIUM 40 MG PO TBEC
40.0000 mg | DELAYED_RELEASE_TABLET | Freq: Two times a day (BID) | ORAL | Status: DC
  Administered 2022-10-01 – 2022-10-02 (×2): 40 mg via ORAL
  Filled 2022-10-01 (×2): qty 1

## 2022-10-01 MED ORDER — INSULIN ASPART 100 UNIT/ML IJ SOLN
0.0000 [IU] | Freq: Three times a day (TID) | INTRAMUSCULAR | Status: DC
  Administered 2022-10-01 – 2022-10-02 (×2): 3 [IU] via SUBCUTANEOUS

## 2022-10-01 MED ORDER — LOPERAMIDE HCL 2 MG PO CAPS: 2.0000 mg | ORAL_CAPSULE | ORAL | Status: DC | PRN

## 2022-10-01 MED ORDER — PEG 3350-KCL-NA BICARB-NACL 420 G PO SOLR
4000.0000 mL | Freq: Once | ORAL | Status: AC
  Administered 2022-10-01: 4000 mL via ORAL

## 2022-10-01 MED ORDER — ONDANSETRON HCL 4 MG/2ML IJ SOLN: 4.0000 mg | Freq: Four times a day (QID) | INTRAMUSCULAR | Status: DC | PRN

## 2022-10-01 MED ORDER — SODIUM CHLORIDE 0.9 % IV SOLN: INTRAVENOUS | Status: DC

## 2022-10-01 MED ORDER — PANTOPRAZOLE SODIUM 40 MG IV SOLR
40.0000 mg | Freq: Two times a day (BID) | INTRAVENOUS | Status: DC
  Administered 2022-10-01: 40 mg via INTRAVENOUS
  Filled 2022-10-01: qty 10

## 2022-10-01 MED ORDER — CHLORHEXIDINE GLUCONATE CLOTH 2 % EX PADS
6.0000 | MEDICATED_PAD | Freq: Every day | CUTANEOUS | Status: DC
  Administered 2022-10-01: 6 via TOPICAL

## 2022-10-01 MED ORDER — MORPHINE SULFATE (PF) 2 MG/ML IV SOLN: 2.0000 mg | INTRAVENOUS | Status: DC | PRN

## 2022-10-01 MED ORDER — ONDANSETRON HCL 4 MG PO TABS: 4.0000 mg | ORAL_TABLET | Freq: Four times a day (QID) | ORAL | Status: DC | PRN

## 2022-10-01 MED ORDER — TORSEMIDE 20 MG PO TABS
100.0000 mg | ORAL_TABLET | Freq: Every day | ORAL | Status: DC
  Administered 2022-10-01 – 2022-10-02 (×2): 100 mg via ORAL
  Filled 2022-10-01 (×2): qty 5

## 2022-10-01 MED ORDER — OXYCODONE HCL 5 MG PO TABS: 5.0000 mg | ORAL_TABLET | ORAL | Status: DC | PRN

## 2022-10-01 MED ORDER — MIDODRINE HCL 5 MG PO TABS
5.0000 mg | ORAL_TABLET | Freq: Three times a day (TID) | ORAL | Status: DC
  Administered 2022-10-01 – 2022-10-02 (×2): 5 mg via ORAL
  Filled 2022-10-01 (×3): qty 1

## 2022-10-01 MED ORDER — INSULIN ASPART 100 UNIT/ML IJ SOLN
0.0000 [IU] | Freq: Every day | INTRAMUSCULAR | Status: DC
  Administered 2022-10-01: 2 [IU] via SUBCUTANEOUS

## 2022-10-01 MED ORDER — ACETAMINOPHEN 325 MG PO TABS: 650.0000 mg | ORAL_TABLET | Freq: Four times a day (QID) | ORAL | Status: DC | PRN

## 2022-10-01 MED ORDER — FERRIC CITRATE 1 GM 210 MG(FE) PO TABS
420.0000 mg | ORAL_TABLET | Freq: Two times a day (BID) | ORAL | Status: DC
  Filled 2022-10-01 (×5): qty 2

## 2022-10-01 MED ORDER — ACETAMINOPHEN 650 MG RE SUPP: 650.0000 mg | Freq: Four times a day (QID) | RECTAL | Status: DC | PRN

## 2022-10-01 MED ORDER — ATORVASTATIN CALCIUM 10 MG PO TABS
10.0000 mg | ORAL_TABLET | Freq: Every day | ORAL | Status: DC
  Administered 2022-10-01: 10 mg via ORAL
  Filled 2022-10-01: qty 1

## 2022-10-01 NOTE — Assessment & Plan Note (Signed)
-   Dressings on bilateral lower extremities due to calciphylaxis - Wound consult - Continue to monitor

## 2022-10-01 NOTE — Assessment & Plan Note (Addendum)
-   No signs of superimposed infection -Continue preventive measures -Continue outpatient follow-up with wound care service.

## 2022-10-01 NOTE — Assessment & Plan Note (Addendum)
-  On hemodialysis for 2 years; T-T-S - Received hemodialysis on 10/01/2022 while inpatient -Continue outpatient hemodialysis treatments as previously arranged. -Adequate hydration and low-sodium diet discussed with patient.

## 2022-10-01 NOTE — Procedures (Signed)
    HEMODIALYSIS TREATMENT NOTE:   Uneventful 3.75 hour heparin-free treatment completed using left upper arm AVF.  Goal met: 2 liters removed without interruption in UF.  All blood was returned and hemostasis was achieved in 15 minutes.  Hand-off given to Arletha Pili RN and Gustavo Lah RN.   Rockwell Alexandria, RN

## 2022-10-01 NOTE — Progress Notes (Signed)
Patient has three bowel movements while in ED and one bloody bowel movement while in floor.  Patient placed on air mattress per order.

## 2022-10-01 NOTE — Assessment & Plan Note (Signed)
-   Resume home hypoglycemic regimen -Modified carbohydrate diet discussed with patient.

## 2022-10-01 NOTE — Consult Note (Signed)
Reason for Consult: ESRD Referring Physician:  Dr. Clearence Ped  Chief Complaint: blood in stool  Dialysis Orders TTS [name] 4hrs EDW 93kg 15ga 400/800  2/2.5 Heparin 2k bolus -> 400u/hr 3400 Epogen Sensipar 79m PO TIW  Assessment/Plan: ESRD Will continue with TTS schedule. Obtained orders from her clinic. Tolerating treatment through the left arm access VSS, trying to get to the her EDW as she left without cramping on Thur @ her EDW. No complaints, no changes.  Anemia:stable. Hb 11.7, on for colonoscopy in the AM, already on broths and liquids + prep. CKD-MBD: continue with binders when she's taking PO's, will check a phos as well. Nutrition:renal diet, carb modified Hypertension: stable Calciphylaxis: Has been on NaThiosulfate but has been taken off H/o Enterobacterales bacteremia:   H/o foot ulcers seen previously by Dr. DSharol Given  HPI: Amy Moses an 51y.o. female with DM, blindness in the right eye, dCHF, HLD, hypothyroidism, pulmonary HTN and ESRD-  TTS [name] with last treatment on 11/9.  She presents with blood in her stool noted on the afternoon of the day of presentation after feeling an urge to evacuate her bowels. She noted that the toilet was full of blood. This was also witnessed again while she was in the hospital. She denies fever, chills, nausea; she also denies shortness of breath, dizziness, chest pain, loss of consciousness. Patient has calciphylaxis and the lower extremities are wrapped because of this.   ROS Pertinent items are noted in HPI.  Chemistry and CBC: Creatinine  Date/Time Value Ref Range Status  12/30/2021 12:00 AM 6.0 (A) 0.5 - 1.1 Final   Creatinine, Ser  Date/Time Value Ref Range Status  09/30/2022 07:49 PM 5.45 (H) 0.44 - 1.00 mg/dL Final  05/03/2022 04:36 AM 7.16 (H) 0.44 - 1.00 mg/dL Final  05/02/2022 04:35 AM 6.27 (H) 0.44 - 1.00 mg/dL Final  04/30/2022 06:31 AM 6.54 (H) 0.44 - 1.00 mg/dL Final   04/29/2022 10:40 PM 6.20 (H) 0.44 - 1.00 mg/dL Final  04/28/2022 11:31 PM 5.12 (H) 0.44 - 1.00 mg/dL Final  02/18/2022 03:17 AM 4.51 (H) 0.44 - 1.00 mg/dL Final  02/17/2022 02:02 AM 5.84 (H) 0.44 - 1.00 mg/dL Final  02/16/2022 02:45 AM 5.18 (H) 0.44 - 1.00 mg/dL Final  02/15/2022 06:41 AM 6.84 (H) 0.44 - 1.00 mg/dL Final  02/14/2022 12:31 PM 6.21 (H) 0.44 - 1.00 mg/dL Final  01/26/2022 05:55 AM 4.74 (H) 0.44 - 1.00 mg/dL Final  01/25/2022 10:03 AM 6.01 (H) 0.44 - 1.00 mg/dL Final  01/24/2022 11:29 PM 5.82 (H) 0.44 - 1.00 mg/dL Final  01/24/2022 04:36 PM 5.80 (H) 0.44 - 1.00 mg/dL Final  01/24/2022 04:25 PM 5.59 (H) 0.44 - 1.00 mg/dL Final  12/12/2021 09:10 AM 6.39 (H) 0.44 - 1.00 mg/dL Final    Comment:    DELTA CHECK NOTED  12/10/2021 10:22 AM 3.80 (H) 0.44 - 1.00 mg/dL Final  09/04/2021 02:19 PM 4.76 (H) 0.44 - 1.00 mg/dL Final  09/03/2021 08:09 AM 3.90 (H) 0.44 - 1.00 mg/dL Final  08/12/2021 07:30 AM 5.04 (H) 0.44 - 1.00 mg/dL Final  08/10/2021 07:12 AM 5.59 (H) 0.44 - 1.00 mg/dL Final  08/07/2021 04:11 AM 4.12 (H) 0.44 - 1.00 mg/dL Final  08/06/2021 03:32 PM 3.78 (H) 0.44 - 1.00 mg/dL Final  05/15/2021 06:29 AM 4.43 (H) 0.44 - 1.00 mg/dL Final  05/14/2021 06:13 AM 3.33 (H) 0.44 - 1.00 mg/dL Final  05/13/2021 04:55 AM 6.32 (H) 0.44 - 1.00 mg/dL Final  05/12/2021 03:27 PM  6.51 (H) 0.44 - 1.00 mg/dL Final  05/11/2021 05:12 AM 5.37 (H) 0.44 - 1.00 mg/dL Final  05/10/2021 04:07 PM 5.16 (H) 0.44 - 1.00 mg/dL Final  04/24/2021 06:34 AM 3.07 (H) 0.44 - 1.00 mg/dL Final    Comment:    DELTA CHECK NOTED  04/23/2021 06:26 AM 4.86 (H) 0.44 - 1.00 mg/dL Final  04/22/2021 09:34 PM 4.57 (H) 0.44 - 1.00 mg/dL Final  03/31/2021 03:55 AM 6.48 (H) 0.44 - 1.00 mg/dL Final  03/30/2021 04:58 PM 6.27 (H) 0.44 - 1.00 mg/dL Final  02/18/2021 07:34 PM 5.13 (H) 0.44 - 1.00 mg/dL Final  12/18/2020 08:53 AM 4.40 (H) 0.44 - 1.00 mg/dL Final  08/18/2020 09:40 AM 5.80 (H) 0.44 - 1.00 mg/dL Final   06/11/2020 03:36 AM 5.40 (H) 0.44 - 1.00 mg/dL Final  06/10/2020 11:54 AM 5.32 (H) 0.44 - 1.00 mg/dL Final   Recent Labs  Lab 09/30/22 1949  NA 135  K 4.3  CL 96*  CO2 29  GLUCOSE 184*  BUN 39*  CREATININE 5.45*  CALCIUM 9.3   Recent Labs  Lab 09/30/22 1949  WBC 6.5  HGB 11.7*  HCT 36.6  MCV 94.6  PLT 142*   Liver Function Tests: Recent Labs  Lab 09/30/22 1949  AST 14*  ALT 13  ALKPHOS 130*  BILITOT 0.9  PROT 7.3  ALBUMIN 3.9   No results for input(s): "LIPASE", "AMYLASE" in the last 168 hours. No results for input(s): "AMMONIA" in the last 168 hours. Cardiac Enzymes: No results for input(s): "CKTOTAL", "CKMB", "CKMBINDEX", "TROPONINI" in the last 168 hours. Iron Studies: No results for input(s): "IRON", "TIBC", "TRANSFERRIN", "FERRITIN" in the last 72 hours. PT/INR: _0 (inr:5)  Xrays/Other Studies: ) Results for orders placed or performed during the hospital encounter of 09/30/22 (from the past 48 hour(s))  Comprehensive metabolic panel     Status: Abnormal   Collection Time: 09/30/22  7:49 PM  Result Value Ref Range   Sodium 135 135 - 145 mmol/L   Potassium 4.3 3.5 - 5.1 mmol/L   Chloride 96 (L) 98 - 111 mmol/L   CO2 29 22 - 32 mmol/L   Glucose, Bld 184 (H) 70 - 99 mg/dL    Comment: Glucose reference range applies only to samples taken after fasting for at least 8 hours.   BUN 39 (H) 6 - 20 mg/dL   Creatinine, Ser 5.45 (H) 0.44 - 1.00 mg/dL   Calcium 9.3 8.9 - 10.3 mg/dL   Total Protein 7.3 6.5 - 8.1 g/dL   Albumin 3.9 3.5 - 5.0 g/dL   AST 14 (L) 15 - 41 U/L   ALT 13 0 - 44 U/L   Alkaline Phosphatase 130 (H) 38 - 126 U/L   Total Bilirubin 0.9 0.3 - 1.2 mg/dL   GFR, Estimated 9 (L) >60 mL/min    Comment: (NOTE) Calculated using the CKD-EPI Creatinine Equation (2021)    Anion gap 10 5 - 15    Comment: Performed at [facility], [address] [place], [place] [address]  CBC     Status: Abnormal   Collection Time: 09/30/22  7:49 PM   Result Value Ref Range   WBC 6.5 4.0 - 10.5 K/uL   RBC 3.87 3.87 - 5.11 MIL/uL   Hemoglobin 11.7 (L) 12.0 - 15.0 g/dL   HCT 36.6 36.0 - 46.0 %   MCV 94.6 80.0 - 100.0 fL   MCH 30.2 26.0 - 34.0 pg   MCHC 32.0 30.0 - 36.0 g/dL  RDW 13.8 11.5 - 15.5 %   Platelets 142 (L) 150 - 400 K/uL   nRBC 0.0 0.0 - 0.2 %    Comment: Performed at [name], [address] [place], [place] [address]  Type and screen [facility]     Status: None   Collection Time: 09/30/22  7:49 PM  Result Value Ref Range   ABO/RH(D) O POS    Antibody Screen NEG    Sample Expiration      10/03/2022,2359 Performed at [facility], [address] [place], [place] [address]   POC occult blood, ED     Status: Abnormal   Collection Time: 09/30/22  9:22 PM  Result Value Ref Range   Fecal Occult Bld POSITIVE (A) NEGATIVE  C Difficile Quick Screen w PCR reflex     Status: None   Collection Time: 10/01/22  1:07 AM   Specimen: STOOL  Result Value Ref Range   C Diff antigen NEGATIVE NEGATIVE   C Diff toxin NEGATIVE NEGATIVE   C Diff interpretation No C. difficile detected.     Comment: Performed at [facility], [address], [place], [place] [address]   CT ANGIO GI BLEED  Result Date: 10/01/2022 CLINICAL DATA:  Lower GI bleed. EXAM: CTA ABDOMEN AND PELVIS WITHOUT AND WITH CONTRAST TECHNIQUE: Multidetector CT imaging of the abdomen and pelvis was performed using the standard protocol during bolus administration of intravenous contrast. Multiplanar reconstructed images and MIPs were obtained and reviewed to evaluate the vascular anatomy. RADIATION DOSE REDUCTION: This exam was performed according to the departmental dose-optimization program which includes automated exposure control, adjustment of the mA and/or kV according to patient size and/or use of iterative reconstruction technique. CONTRAST:  172m OMNIPAQUE IOHEXOL 350 MG/ML SOLN COMPARISON:  None Available. FINDINGS: VASCULAR Aorta: Moderate  atherosclerotic calcification of the aorta, advanced for patient's age. No aneurysmal dilatation or dissection. No periaortic fluid collection. Celiac: Atherosclerotic calcification the celiac trunk and major branches. These vessels however remain patent. SMA: The SMA is patent. Renals: The renal arteries are patent. IMA: The IMA is patent. Inflow: Moderate atherosclerotic calcification of the iliac arteries. The iliac arteries remain patent. Proximal Outflow: Atherosclerotic calcification. The visualized common femoral arteries and proximal deep and superficial femoral arteries are patent. Veins: The IVC is unremarkable. The SMV, splenic vein, and main portal vein are patent. No portal venous gas. Review of the MIP images confirms the above findings. NON-VASCULAR Lower chest: The visualized lung bases are clear. Three vessel coronary vascular calcification. Partially visualized small pericardial effusion measuring 7 mm in thickness. No intra-abdominal free air or free fluid. Hepatobiliary: The liver is enlarged measuring approximately 18 cm in midclavicular length. There is slight heterogeneous enhancement of the liver. Correlation with LFTs recommended. Mild biliary dilatation, likely post cholecystectomy. Pancreas: Unremarkable. No pancreatic ductal dilatation or surrounding inflammatory changes. Spleen: Normal in size without focal abnormality. Adrenals/Urinary Tract: The adrenal glands are unremarkable. Small bilateral renal parenchyma atrophy. There is no hydronephrosis on either side. Bilateral renal vascular calcifications. The visualized ureters are unremarkable. The urinary bladder is collapsed. Stomach/Bowel: Thickened appearance of the rectum may be related to underdistention or represent mild proctitis. Clinical correlation is recommended. There is no bowel obstruction. No evidence of active GI bleed. The appendix is normal. Lymphatic: No intra-abdominal adenopathy. Mildly enlarged bilateral inguinal  lymph nodes, likely reactive. Reproductive: Calcified uterine fibroids. No definite lung vessel masses. Other: Mild subcutaneous edema. Musculoskeletal: Osteopenia with degenerative changes of the lower lumbar spine. No  acute osseous pathology. IMPRESSION: 1. No evidence of active GI bleed. 2. Thickened appearance of the rectum may be related to underdistention or represent mild proctitis. No bowel obstruction. Normal appendix. 3. Hepatomegaly with possible hepatitis. Correlation with LFTs recommended. 4. Partially visualized small pericardial effusion measuring 7 mm in thickness. 5. Advanced Aortic Atherosclerosis (ICD10-I70.0). 6. Three vessel coronary vascular calcification. Electronically Signed   By: Anner Crete M.D.   On: 10/01/2022 00:57    PMH:   Past Medical History:  Diagnosis Date   Anemia    Blindness of right eye with low vision in contralateral eye    s/p victrectomy   Chronic diastolic heart failure ([place]) 03/30/2021   Diabetes mellitus, type II ([place])    Dyslipidemia    Glaucoma    History of blood transfusion    Hypertension    Hypothyroidism (acquired)    Kidney disease    Stage 5   Pneumonia    Pulmonary hypertension, unspecified ([place]) 09/12/2022    PSH:   Past Surgical History:  Procedure Laterality Date   ABDOMINAL AORTOGRAM W/LOWER EXTREMITY Bilateral 12/18/2020   Procedure: ABDOMINAL AORTOGRAM W/LOWER EXTREMITY;  Surgeon: Elam Dutch, MD;  Location: [facility];  Service: Cardiovascular;  Laterality: Bilateral;   ABDOMINAL AORTOGRAM W/LOWER EXTREMITY Bilateral 01/25/2022   Procedure: ABDOMINAL AORTOGRAM W/LOWER EXTREMITY;  Surgeon: Serafina Mitchell, MD;  Location: [place] [facility];  Service: Cardiovascular;  Laterality: Bilateral;   AMPUTATION Right 02/16/2022   Procedure: RIGHT FOOT 5TH RAY AMPUTATION;  Surgeon: Newt Minion, MD;  Location: [place];  Service: Orthopedics;  Laterality: Right;   ANKLE FRACTURE SURGERY Right    AV FISTULA PLACEMENT  Left 08/18/2020   Procedure: LEFT ARM BRACHIOCEPHALIC ARTERIOVENOUS (AV) FISTULA CREATION;  Surgeon: Elam Dutch, MD;  Location: [place];  Service: Vascular;  Laterality: Left;   BIOPSY  04/24/2021   Procedure: BIOPSY;  Surgeon: Eloise Harman, DO;  [facility];  Service: Endoscopy;;   CESAREAN SECTION     CHOLECYSTECTOMY     COLONOSCOPY  04/24/2021   Surgeon: Eloise Harman, DO;  nonbleeding internal hemorrhoids, 1 large (25 mm) pedunculated transverse colon polyp (prolapse type polyp) with adherent clot and stigmata of recent bleed.   COLONOSCOPY WITH PROPOFOL N/A 05/14/2021   Procedure: COLONOSCOPY WITH PROPOFOL;  Surgeon: Daneil Dolin, MD;  [facility];  Service: Endoscopy;  Laterality: N/A;   ESOPHAGOGASTRODUODENOSCOPY (EGD) WITH PROPOFOL N/A 04/24/2021   Surgeon: Eloise Harman, DO;  duodenal erosions and gastritis biopsied (pathology with peptic duodenitis, reactive gastropathy with erosions/chronic inflammation, negative for H. pylori)   EYE SURGERY     Vatrectomy   HEMOSTASIS CLIP PLACEMENT  05/14/2021   Procedure: HEMOSTASIS CLIP PLACEMENT;  Surgeon: Daneil Dolin, MD;  [facility];  Service: Endoscopy;;   IR PERC TUN PERIT CATH WO PORT S&I /IMAG  09/15/2020   IR REMOVAL TUN CV CATH W/O FL  02/19/2021   IR US GUIDE VASC ACCESS RIGHT  09/15/2020   POLYPECTOMY  04/24/2021   Procedure: POLYPECTOMY;  Surgeon: Eloise Harman, DO;  [facility];  Service: Endoscopy;;   POLYPECTOMY  05/14/2021   Procedure: POLYPECTOMY;  Surgeon: Daneil Dolin, MD;  [facility];  Service: Endoscopy;;   SKIN SPLIT GRAFT Bilateral 09/03/2021   Procedure: SKIN GRAFT BILATERAL LEGS;  Surgeon: Newt Minion, MD;  Location: [place];  Service: Orthopedics;  Laterality: Bilateral;   SKIN  SPLIT GRAFT Left 12/10/2021   Procedure: IRRIGATION AND DEBRIDEMENT LEFT CALF, APPLICATION SPLIT THICKNESS SKIN GRAFT;  Surgeon: Newt Minion, MD;   Location: [place];  Service: Orthopedics;  Laterality: Left;   TOE SURGERY      Allergies:  Allergies  Allergen Reactions   Ace Inhibitors Cough    Medications:   Prior to Admission medications   Medication Sig Start Date End Date Taking? Authorizing Provider  acetaminophen (TYLENOL) 500 MG tablet Take 1,000 mg by mouth every 6 (six) hours as needed for moderate pain.    [provider]  atorvastatin (LIPITOR) 10 MG tablet Take 1 tablet (10 mg total) by mouth daily. Patient taking differently: Take 10 mg by mouth at bedtime. 04/25/21   Roxan Hockey, MD  AURYXIA 1 GM 210 MG(Fe) tablet Take 420 mg by mouth 2 (two) times daily with a meal. 01/07/22   [provider]  Blood Glucose Monitoring Suppl (ACCU-CHEK GUIDE ME) w/Device KIT 1 Piece by Does not apply route as directed. 03/16/22   Cassandria Anger, MD  Blood Pressure Monitor KIT TAKE BLOOD PRESSURE DAILY 09/15/22   Skeet Latch, MD  Continuous Blood Gluc Receiver (FREESTYLE LIBRE 2 READER) DEVI As directed 04/06/22   Cassandria Anger, MD  Continuous Blood Gluc Sensor (FREESTYLE LIBRE 2 SENSOR) MISC 1 Piece by Does not apply route every 14 (fourteen) days. 04/06/22   Cassandria Anger, MD  glucose blood (ACCU-CHEK GUIDE) test strip Use as instructed 03/16/22   Cassandria Anger, MD  HUMALOG KWIKPEN 100 UNIT/ML KwikPen Inject 5-11 Units into the skin 3 (three) times daily with meals. If eats 50% or more of meal. 03/16/22   Nida, Marella Chimes, MD  insulin glargine (LANTUS) 100 UNIT/ML injection Inject 0.2 mLs (20 Units total) into the skin at bedtime. 04/06/22   Cassandria Anger, MD  levothyroxine (SYNTHROID) 75 MCG tablet Take 75 mcg by mouth daily. 06/13/22   [provider]  midodrine (PROAMATINE) 2.5 MG tablet TAKE 1 TABLET 1 HOUR PRIOR TO DIALYSIS 09/12/22   Skeet Latch, MD  pantoprazole (PROTONIX) 40 MG tablet Take 1 tablet (40 mg total) by mouth 2 (two) times daily. 04/25/21  09/12/22  Roxan Hockey, MD  polyethylene glycol (MIRALAX / GLYCOLAX) 17 g packet Take 17 g by mouth daily as needed for mild constipation. 02/19/22   Nita Sells, MD  silver sulfADIAZINE (SILVADENE) 1 % cream Apply 1 application. topically daily. Apply to affected area daily plus dry dressing 02/24/22   Newt Minion, MD  torsemide (DEMADEX) 100 MG tablet Take 100 mg by mouth daily. 08/05/22   [provider]  Vitamin D, Ergocalciferol, (DRISDOL) 1.25 MG (50000 UNIT) CAPS capsule Take 50,000 Units by mouth every 7 (seven) days. 04/20/21   [provider]    Discontinued Meds:   Medications Discontinued During This Encounter  Medication Reason   midodrine (PROAMATINE) tablet 5 mg     Social History:  reports that she has never smoked. She has never used smokeless tobacco. She reports that she does not drink alcohol and does not use drugs.  Family History:   Family History  Problem Relation Age of Onset   Heart failure Mother    Heart disease Mother    Diabetes Mother    Kidney disease Mother    Heart failure Father    Diabetes Father    Heart disease Father    Diabetes Brother    Heart failure Maternal Grandmother  Heart failure Maternal Grandfather    Transient ischemic attack Maternal Grandfather    Colon cancer Neg Hx     Blood pressure (!) 170/75, pulse 64, temperature 98.3 F (36.8 C), temperature source Oral, resp. rate 17, height _0  (1.676 m), weight 94.4 kg, last menstrual period 08/22/2015, SpO2 95 %. Gen:NAD CVS: RRR Resp: CTA B/L, no rales Abd: +BS, soft, NT/ND Ext: calciphylaxis of thighs, calves wrapped, LUE AVF +B, needles in place Neuro: grossly intact Neck: No noted JVD     Dwana Melena, MD 10/01/2022, 6:06 AM

## 2022-10-01 NOTE — Progress Notes (Signed)
Patient seen and examined; admitted after midnight secondary to rectal bleeding.  Patient reports struggling with constipation lately; first event of lower GI bleed since June of 2022.  No chest pain, no nausea, no vomiting, no palpitations and no shortness of breath.  Hemodynamically stable currently.  Please refer to H&P written by Dr. Clearence Ped on 10/01/22 further info/details on admission.  Plan: -Case has been discussed with gastroenterology service; plan is for colonoscopy on 10/02/2022 -Continue to allow clear liquid diet with n.p.o. state after midnight. -Follow hemoglobin trend and transfuse for hemoglobin less than 7.5. -Nephrology on board to further assist with hemodialysis management. -Follow clinical response.  Barton Dubois MD [phone]

## 2022-10-01 NOTE — Assessment & Plan Note (Signed)
-  FOBT positive - Significant blood in stool, which appears to be secondary to proctitis as per gastroenterology service evaluation. - Hemoglobin has remained stable at 11.7 - Safe for patient to be discharged home with continuation of iron and daily MiraLAX. -As needed Anusol also prescribed. -Outpatient follow-up with GI will be arranged and if needed colonoscopy will be done.

## 2022-10-01 NOTE — Consult Note (Signed)
Consulting  Provider: Dr. Matilde Sprang Primary Care Physician:  Emelda Fear, DO Primary Gastroenterologist:  Dr. Abbey Chatters  Reason for Consultation: Rectal bleeding, acute blood loss anemia  HPI:  Amy Moses is a 51 y.o. female with a past medical history of chronic diastolic CHF, DM2, dyslipidemia, ESRD on hemodialysis, hypothyroidism, hypertension, who presented to [facility], ER yesterday evening with chief complaint of rectal bleeding.  Patient states she has been struggling with constipation as of late.  Notes straining with bowel movements.  Small caliber stools.  Yesterday afternoon she had the sudden urge to have a bowel movement which was loose.  She noted that the toilet was full of blood.  Has happened 4-5 times while in the hospital as well.  No dyspnea or generalized weakness/fatigue.  No lightheadedness.  No chest pain or epigastric pain.  History of GI bleed 04/24/2021.  Colonoscopy with 25 mm pedunculated actively bleeding polyp in her transverse colon which was removed.  Unfortunately she had a post polypectomy bleed related to this and again had colonoscopy on 05/14/2021 with subsequent clip placement.  EGD 04/24/2021 with duodenitis.  Does have chronic reflux which is well controlled on pantoprazole.  In the ER, hemoglobin 11.7, now 10.2 which is near her baseline.  CT angiogram without active GI bleed.  Did note possible proctitis.  Past Medical History:  Diagnosis Date   Anemia    Blindness of right eye with low vision in contralateral eye    s/p victrectomy   Chronic diastolic heart failure ([place]) 03/30/2021   Diabetes mellitus, type II ([place])    Dyslipidemia    Glaucoma    History of blood transfusion    Hypertension    Hypothyroidism (acquired)    Kidney disease    Stage 5   Pneumonia    Pulmonary hypertension, unspecified ([place]) 09/12/2022    Past Surgical History:  Procedure Laterality Date   ABDOMINAL AORTOGRAM W/LOWER EXTREMITY Bilateral 12/18/2020    Procedure: ABDOMINAL AORTOGRAM W/LOWER EXTREMITY;  Surgeon: Elam Dutch, MD;  Location: [place] [facility];  Service: Cardiovascular;  Laterality: Bilateral;   ABDOMINAL AORTOGRAM W/LOWER EXTREMITY Bilateral 01/25/2022   Procedure: ABDOMINAL AORTOGRAM W/LOWER EXTREMITY;  Surgeon: Serafina Mitchell, MD;  Location: [place] [facility];  Service: Cardiovascular;  Laterality: Bilateral;   AMPUTATION Right 02/16/2022   Procedure: RIGHT FOOT 5TH RAY AMPUTATION;  Surgeon: Newt Minion, MD;  Location: [place];  Service: Orthopedics;  Laterality: Right;   ANKLE FRACTURE SURGERY Right    AV FISTULA PLACEMENT Left 08/18/2020   Procedure: LEFT ARM BRACHIOCEPHALIC ARTERIOVENOUS (AV) FISTULA CREATION;  Surgeon: Elam Dutch, MD;  Location: [place];  Service: Vascular;  Laterality: Left;   BIOPSY  04/24/2021   Procedure: BIOPSY;  Surgeon: Eloise Harman, DO;  [facility];  Service: Endoscopy;;   CESAREAN SECTION     CHOLECYSTECTOMY     COLONOSCOPY  04/24/2021   Surgeon: Eloise Harman, DO;  nonbleeding internal hemorrhoids, 1 large (25 mm) pedunculated transverse colon polyp (prolapse type polyp) with adherent clot and stigmata of recent bleed.   COLONOSCOPY WITH PROPOFOL N/A 05/14/2021   Procedure: COLONOSCOPY WITH PROPOFOL;  Surgeon: Daneil Dolin, MD;  [facility];  Service: Endoscopy;  Laterality: N/A;   ESOPHAGOGASTRODUODENOSCOPY (EGD) WITH PROPOFOL N/A 04/24/2021   Surgeon: Eloise Harman, DO;  duodenal erosions and gastritis biopsied (pathology with peptic duodenitis, reactive gastropathy with erosions/chronic inflammation, negative for H. pylori)   EYE SURGERY  Vatrectomy   HEMOSTASIS CLIP PLACEMENT  05/14/2021   Procedure: HEMOSTASIS CLIP PLACEMENT;  Surgeon: Daneil Dolin, MD;  [facility];  Service: Endoscopy;;   IR PERC TUN PERIT CATH WO PORT S&I /IMAG  09/15/2020   IR REMOVAL TUN CV CATH W/O FL  02/19/2021   IR US GUIDE VASC ACCESS RIGHT   09/15/2020   POLYPECTOMY  04/24/2021   Procedure: POLYPECTOMY;  Surgeon: Eloise Harman, DO;  [facility];  Service: Endoscopy;;   POLYPECTOMY  05/14/2021   Procedure: POLYPECTOMY;  Surgeon: Daneil Dolin, MD;  [facility];  Service: Endoscopy;;   SKIN SPLIT GRAFT Bilateral 09/03/2021   Procedure: SKIN GRAFT BILATERAL LEGS;  Surgeon: Newt Minion, MD;  Location: [place];  Service: Orthopedics;  Laterality: Bilateral;   SKIN SPLIT GRAFT Left 12/10/2021   Procedure: IRRIGATION AND DEBRIDEMENT LEFT CALF, APPLICATION SPLIT THICKNESS SKIN GRAFT;  Surgeon: Newt Minion, MD;  Location: [place];  Service: Orthopedics;  Laterality: Left;   TOE SURGERY      Prior to Admission medications   Medication Sig Start Date End Date Taking? Authorizing Provider  acetaminophen (TYLENOL) 500 MG tablet Take 1,000 mg by mouth every 6 (six) hours as needed for moderate pain.    [provider]  atorvastatin (LIPITOR) 10 MG tablet Take 1 tablet (10 mg total) by mouth daily. Patient taking differently: Take 10 mg by mouth at bedtime. 04/25/21   Roxan Hockey, MD  AURYXIA 1 GM 210 MG(Fe) tablet Take 420 mg by mouth 2 (two) times daily with a meal. 01/07/22   [provider]  Blood Glucose Monitoring Suppl (ACCU-CHEK GUIDE ME) w/Device KIT 1 Piece by Does not apply route as directed. 03/16/22   Cassandria Anger, MD  Blood Pressure Monitor KIT TAKE BLOOD PRESSURE DAILY 09/15/22   Skeet Latch, MD  Continuous Blood Gluc Receiver (FREESTYLE LIBRE 2 READER) DEVI As directed 04/06/22   Cassandria Anger, MD  Continuous Blood Gluc Sensor (FREESTYLE LIBRE 2 SENSOR) MISC 1 Piece by Does not apply route every 14 (fourteen) days. 04/06/22   Cassandria Anger, MD  glucose blood (ACCU-CHEK GUIDE) test strip Use as instructed 03/16/22   Cassandria Anger, MD  HUMALOG KWIKPEN 100 UNIT/ML KwikPen Inject 5-11 Units into the skin 3 (three) times daily with meals. If eats 50%  or more of meal. 03/16/22   Nida, Marella Chimes, MD  insulin glargine (LANTUS) 100 UNIT/ML injection Inject 0.2 mLs (20 Units total) into the skin at bedtime. 04/06/22   Cassandria Anger, MD  levothyroxine (SYNTHROID) 75 MCG tablet Take 75 mcg by mouth daily. 06/13/22   [provider]  midodrine (PROAMATINE) 2.5 MG tablet TAKE 1 TABLET 1 HOUR PRIOR TO DIALYSIS 09/12/22   Skeet Latch, MD  pantoprazole (PROTONIX) 40 MG tablet Take 1 tablet (40 mg total) by mouth 2 (two) times daily. 04/25/21 09/12/22  Roxan Hockey, MD  polyethylene glycol (MIRALAX / GLYCOLAX) 17 g packet Take 17 g by mouth daily as needed for mild constipation. 02/19/22   Nita Sells, MD  silver sulfADIAZINE (SILVADENE) 1 % cream Apply 1 application. topically daily. Apply to affected area daily plus dry dressing 02/24/22   Newt Minion, MD  torsemide (DEMADEX) 100 MG tablet Take 100 mg by mouth daily. 08/05/22   [provider]  Vitamin D, Ergocalciferol, (DRISDOL) 1.25 MG (50000 UNIT) CAPS capsule Take 50,000 Units by mouth every 7 (seven) days. 04/20/21  [provider]    Current Facility-Administered Medications  Medication Dose Route Frequency Provider Last Rate Last Admin   0.9 %  sodium chloride infusion   Intravenous Continuous Hurshel Keys K, DO       acetaminophen (TYLENOL) tablet 650 mg  650 mg Oral Q6H PRN Zierle-Ghosh, Asia B, DO       Or   acetaminophen (TYLENOL) suppository 650 mg  650 mg Rectal Q6H PRN Zierle-Ghosh, Asia B, DO       atorvastatin (LIPITOR) tablet 10 mg  10 mg Oral QHS Zierle-Ghosh, Asia B, DO       Chlorhexidine Gluconate Cloth 2 % PADS 6 each  6 each Topical Q0600 Dwana Melena, MD   6 each at 10/01/22 0915   fentaNYL (SUBLIMAZE) injection 50 mcg  50 mcg Intravenous Once PRN Zierle-Ghosh, Asia B, DO       ferric citrate (AURYXIA) tablet 420 mg  420 mg Oral BID WC Zierle-Ghosh, Asia B, DO       insulin aspart (novoLOG) injection 0-15 Units  0-15 Units  Subcutaneous TID WC Zierle-Ghosh, Asia B, DO   3 Units at 10/01/22 0915   insulin aspart (novoLOG) injection 0-5 Units  0-5 Units Subcutaneous QHS Zierle-Ghosh, Asia B, DO       insulin detemir (LEVEMIR) injection 13 Units  13 Units Subcutaneous QHS Zierle-Ghosh, Asia B, DO       loperamide (IMODIUM) capsule 2 mg  2 mg Oral PRN Zierle-Ghosh, Asia B, DO       midodrine (PROAMATINE) tablet 5 mg  5 mg Oral TID WC Zierle-Ghosh, Asia B, DO       morphine (PF) 2 MG/ML injection 2 mg  2 mg Intravenous Q2H PRN Zierle-Ghosh, Asia B, DO       ondansetron (ZOFRAN) tablet 4 mg  4 mg Oral Q6H PRN Zierle-Ghosh, Asia B, DO       Or   ondansetron (ZOFRAN) injection 4 mg  4 mg Intravenous Q6H PRN Zierle-Ghosh, Asia B, DO       oxyCODONE (Oxy IR/ROXICODONE) immediate release tablet 5 mg  5 mg Oral Q4H PRN Zierle-Ghosh, Asia B, DO       pantoprazole (PROTONIX) injection 40 mg  40 mg Intravenous Q12H Zierle-Ghosh, Asia B, DO   40 mg at 10/01/22 0916   polyethylene glycol-electrolytes (NuLYTELY) solution 4,000 mL  4,000 mL Oral Once Eloise Harman, DO       torsemide [facility]) tablet 100 mg  100 mg Oral Daily Zierle-Ghosh, Asia B, DO   100 mg at 10/01/22 0916    Allergies as of 09/30/2022 - Review Complete 09/30/2022  Allergen Reaction Noted   Ace inhibitors Cough 11/19/2020    Family History  Problem Relation Age of Onset   Heart failure Mother    Heart disease Mother    Diabetes Mother    Kidney disease Mother    Heart failure Father    Diabetes Father    Heart disease Father    Diabetes Brother    Heart failure Maternal Grandmother    Heart failure Maternal Grandfather    Transient ischemic attack Maternal Grandfather    Colon cancer Neg Hx     Social History   Socioeconomic History   Marital status: Single    Spouse name: Not on file   Number of children: Not on file   Years of education: Not on file   Highest education level: Not on file  Occupational History   Not on  file  Tobacco  Use   Smoking status: Never   Smokeless tobacco: Never  Vaping Use   Vaping Use: Never used  Substance and Sexual Activity   Alcohol use: No   Drug use: No   Sexual activity: Yes    Birth control/protection: Condom  Other Topics Concern   Not on file  Social History Narrative   Not on file   Social Determinants of Health   Financial Resource Strain: Not on file  Food Insecurity: Not on file  Transportation Needs: Not on file  Physical Activity: Not on file  Stress: Not on file  Social Connections: Not on file  Intimate Partner Violence: Not on file    Review of Systems: General: Negative for anorexia, weight loss, fever, chills, fatigue, weakness. Eyes: Negative for vision changes.  ENT: Negative for hoarseness, difficulty swallowing , nasal congestion. CV: Negative for chest pain, angina, palpitations, dyspnea on exertion, peripheral edema.  Respiratory: Negative for dyspnea at rest, dyspnea on exertion, cough, sputum, wheezing.  GI: See history of present illness. GU:  Negative for dysuria, hematuria, urinary incontinence, urinary frequency, nocturnal urination.  MS: Negative for joint pain, low back pain.  Derm: Negative for rash or itching.  Neuro: Negative for weakness, abnormal sensation, seizure, frequent headaches, memory loss, confusion.  Psych: Negative for anxiety, depression Endo: Negative for unusual weight change.  Heme: Negative for bruising or bleeding. Allergy: Negative for rash or hives.  Physical Exam: Vital signs in last 24 hours: Temp:  [97.4 F (36.3 C)-98.3 F (36.8 C)] 98 F (36.7 C) (11/11 0725) Pulse Rate:  [64-73] 73 (11/11 0725) Resp:  [16-17] 17 (11/11 0331) BP: (91-170)/(52-75) 97/64 (11/11 0725) SpO2:  [95 %-100 %] 98 % (11/11 0725) Weight:  [94.4 kg-96 kg] 94.4 kg (11/11 0307)   General:   Alert,  Well-developed, well-nourished, pleasant and cooperative in NAD Head:  Normocephalic and atraumatic. Eyes:  Sclera clear, no icterus.    Conjunctiva pink. Ears:  Normal auditory acuity. Nose:  No deformity, discharge,  or lesions. Mouth:  No deformity or lesions, dentition normal. Neck:  Supple; no masses or thyromegaly. Lungs:  Clear throughout to auscultation.   No wheezes, crackles, or rhonchi. No acute distress. Heart:  Regular rate and rhythm; no murmurs, clicks, rubs,  or gallops. Abdomen:  Soft, nontender and nondistended. No masses, hepatosplenomegaly or hernias noted. Normal bowel sounds, without guarding, and without rebound.   Msk:  Symmetrical without gross deformities. Normal posture. Pulses:  Normal pulses noted. Extremities:  Without clubbing or edema. Neurologic:  Alert and  oriented x4;  grossly normal neurologically. Skin:  Intact without significant lesions or rashes. Cervical Nodes:  No significant cervical adenopathy. Psych:  Alert and cooperative. Normal mood and affect.  Intake/Output from previous day: No intake/output data recorded. Intake/Output this shift: No intake/output data recorded.  Lab Results: Recent Labs    09/30/22 1949 10/01/22 0456  WBC 6.5 5.2  HGB 11.7* 10.2*  HCT 36.6 32.1*  PLT 142* 111*   BMET Recent Labs    09/30/22 1949 10/01/22 0456  NA 135 135  K 4.3 3.8  CL 96* 98  CO2 29 26  GLUCOSE 184* 182*  BUN 39* 41*  CREATININE 5.45* 5.80*  CALCIUM 9.3 8.7*   LFT Recent Labs    09/30/22 1949 10/01/22 0456  PROT 7.3 6.1*  ALBUMIN 3.9 3.3*  AST 14* 12*  ALT 13 10  ALKPHOS 130* 114  BILITOT 0.9 0.7   PT/INR Recent Labs  10/01/22 0456  LABPROT 13.7  INR 1.1   Hepatitis Panel No results for input(s): "HEPBSAG", "HCVAB", "HEPAIGM", "HEPBIGM" in the last 72 hours. C-Diff Recent Labs    10/01/22 0107  CDIFFTOX NEGATIVE    Studies/Results: CT ANGIO GI BLEED  Result Date: 10/01/2022 CLINICAL DATA:  Lower GI bleed. EXAM: CTA ABDOMEN AND PELVIS WITHOUT AND WITH CONTRAST TECHNIQUE: Multidetector CT imaging of the abdomen and pelvis was performed  using the standard protocol during bolus administration of intravenous contrast. Multiplanar reconstructed images and MIPs were obtained and reviewed to evaluate the vascular anatomy. RADIATION DOSE REDUCTION: This exam was performed according to the departmental dose-optimization program which includes automated exposure control, adjustment of the mA and/or kV according to patient size and/or use of iterative reconstruction technique. CONTRAST:  154m OMNIPAQUE IOHEXOL 350 MG/ML SOLN COMPARISON:  None Available. FINDINGS: VASCULAR Aorta: Moderate atherosclerotic calcification of the aorta, advanced for patient's age. No aneurysmal dilatation or dissection. No periaortic fluid collection. Celiac: Atherosclerotic calcification the celiac trunk and major branches. These vessels however remain patent. SMA: The SMA is patent. Renals: The renal arteries are patent. IMA: The IMA is patent. Inflow: Moderate atherosclerotic calcification of the iliac arteries. The iliac arteries remain patent. Proximal Outflow: Atherosclerotic calcification. The visualized common femoral arteries and proximal deep and superficial femoral arteries are patent. Veins: The IVC is unremarkable. The SMV, splenic vein, and main portal vein are patent. No portal venous gas. Review of the MIP images confirms the above findings. NON-VASCULAR Lower chest: The visualized lung bases are clear. Three vessel coronary vascular calcification. Partially visualized small pericardial effusion measuring 7 mm in thickness. No intra-abdominal free air or free fluid. Hepatobiliary: The liver is enlarged measuring approximately 18 cm in midclavicular length. There is slight heterogeneous enhancement of the liver. Correlation with LFTs recommended. Mild biliary dilatation, likely post cholecystectomy. Pancreas: Unremarkable. No pancreatic ductal dilatation or surrounding inflammatory changes. Spleen: Normal in size without focal abnormality. Adrenals/Urinary Tract:  The adrenal glands are unremarkable. Small bilateral renal parenchyma atrophy. There is no hydronephrosis on either side. Bilateral renal vascular calcifications. The visualized ureters are unremarkable. The urinary bladder is collapsed. Stomach/Bowel: Thickened appearance of the rectum may be related to underdistention or represent mild proctitis. Clinical correlation is recommended. There is no bowel obstruction. No evidence of active GI bleed. The appendix is normal. Lymphatic: No intra-abdominal adenopathy. Mildly enlarged bilateral inguinal lymph nodes, likely reactive. Reproductive: Calcified uterine fibroids. No definite lung vessel masses. Other: Mild subcutaneous edema. Musculoskeletal: Osteopenia with degenerative changes of the lower lumbar spine. No acute osseous pathology. IMPRESSION: 1. No evidence of active GI bleed. 2. Thickened appearance of the rectum may be related to underdistention or represent mild proctitis. No bowel obstruction. Normal appendix. 3. Hepatomegaly with possible hepatitis. Correlation with LFTs recommended. 4. Partially visualized small pericardial effusion measuring 7 mm in thickness. 5. Advanced Aortic Atherosclerosis (ICD10-I70.0). 6. Three vessel coronary vascular calcification. Electronically Signed   By: AAnner CreteM.D.   On: 10/01/2022 00:57    Impression: *Rectal bleeding *Acute blood loss anemia *Abnormal CT *Chronic GERD  Plan: Etiology of patient's rectal bleeding unclear.  Possible stercoral colitis in the setting of chronic constipation given proctitis seen on CT imaging.  We will prep colon tonight anticipation of colonoscopy tomorrow to further evaluate. The risks including infection, bleed, or perforation as well as benefits, limitations, alternatives and imponderables have been reviewed with the patient. Questions have been answered. All parties agreeable.  Continue to monitor H&H and transfuse  for less than 7.  Patient to receive  hemodialysis today.  Clear liquids okay today.  N.p.o. after midnight.  Change pantoprazole to oral.  Further recommendations to follow  Elon Alas. Abbey Chatters, D.O. Gastroenterology and Hepatology [facility]    LOS: 0 days     10/01/2022, 10:52 AM

## 2022-10-01 NOTE — ED Notes (Signed)
Verbal report given to Unit [address], patient transferred via stretcher.

## 2022-10-01 NOTE — Progress Notes (Signed)
Changed dressings to posterior upper thigh wounds while getting dialysis.  Consent signed for colonoscopy tomorrow.

## 2022-10-01 NOTE — H&P (Signed)
History and Physical    Patient: Amy Moses ZOX:[id number] DOB: May 11, 1971 DOA: 09/30/2022 DOS: the patient was seen and examined on 10/01/2022 PCP: Emelda Fear, DO  Patient coming from: Home  Chief Complaint:  Chief Complaint  Patient presents with   Hematochezia   HPI: Amy Moses is a 51 y.o. female with medical history significant of blindness of the right eye, chronic diastolic CHF, diabetes mellitus type 2, dyslipidemia, hypertension, hypothyroidism, ESRD on HD, pulmonary hypertension, and more presents to the ED with chief complaint of blood in her stool.  Patient reports that on the 10th around 1:30 PM, she felt the urge to evacuate her bowels, and when she when she thought it was loose stool until she saw that the toilet was full of blood.  This happened 1 time at home and it happened 2-3 times here in the hospital.  Is painless bleeding.  She had no nausea.  Her appetite is intact, although she has not eaten since the ninth due to her schedule and being held n.p.o. when she arrived to the hospital.  Patient reports no shortness of breath, paresthesias, lightheadedness, chest pain, palpitations, or other symptoms of anemia.  Patient reports that she has had a transfusion before within the last couple of years.  It was not due to GI bleed and just her hemoglobin was low at that time.  Patient does have ESRD, she is on HD on Tuesday/Thursday/Saturday.  Patient denies any other bleeding including no epistaxis, no vaginal bleeding, no big bruises.  She does not make urine, so there is obviously no hematuria.  Patient has no other complaints at this time and reports that she was otherwise in her normal state of health.  Patient does have calciphylaxis, and per bilateral lower extremities have 4-layer wraps due to this calciphylaxis.  Wound consult ordered.   Patient does not smoke, does not drink, does not use illicit drugs and she is vaccinated for COVID.  Patient is full  code. Review of Systems: As mentioned in the history of present illness. All other systems reviewed and are negative. Past Medical History:  Diagnosis Date   Anemia    Blindness of right eye with low vision in contralateral eye    s/p victrectomy   Chronic diastolic heart failure ([place]) 03/30/2021   Diabetes mellitus, type II ([place])    Dyslipidemia    Glaucoma    History of blood transfusion    Hypertension    Hypothyroidism (acquired)    Kidney disease    Stage 5   Pneumonia    Pulmonary hypertension, unspecified ([place]) 09/12/2022   Past Surgical History:  Procedure Laterality Date   ABDOMINAL AORTOGRAM W/LOWER EXTREMITY Bilateral 12/18/2020   Procedure: ABDOMINAL AORTOGRAM W/LOWER EXTREMITY;  Surgeon: Elam Dutch, MD;  Location: [facility];  Service: Cardiovascular;  Laterality: Bilateral;   ABDOMINAL AORTOGRAM W/LOWER EXTREMITY Bilateral 01/25/2022   Procedure: ABDOMINAL AORTOGRAM W/LOWER EXTREMITY;  Surgeon: Serafina Mitchell, MD;  Location: [facility] [facility];  Service: Cardiovascular;  Laterality: Bilateral;   AMPUTATION Right 02/16/2022   Procedure: RIGHT FOOT 5TH RAY AMPUTATION;  Surgeon: Newt Minion, MD;  Location: [place] [facility];  Service: Orthopedics;  Laterality: Right;   ANKLE FRACTURE SURGERY Right    AV FISTULA PLACEMENT Left 08/18/2020   Procedure: LEFT ARM BRACHIOCEPHALIC ARTERIOVENOUS (AV) FISTULA CREATION;  Surgeon: Elam Dutch, MD;  Location: [place];  Service: Vascular;  Laterality: Left;   BIOPSY  04/24/2021   Procedure: BIOPSY;  Surgeon:  Eloise Harman, DO;  [facility];  Service: Endoscopy;;   CESAREAN SECTION     CHOLECYSTECTOMY     COLONOSCOPY  04/24/2021   Surgeon: Eloise Harman, DO;  nonbleeding internal hemorrhoids, 1 large (25 mm) pedunculated transverse colon polyp (prolapse type polyp) with adherent clot and stigmata of recent bleed.   COLONOSCOPY WITH PROPOFOL N/A 05/14/2021   Procedure: COLONOSCOPY WITH PROPOFOL;  Surgeon:  Daneil Dolin, MD;  [facility];  Service: Endoscopy;  Laterality: N/A;   ESOPHAGOGASTRODUODENOSCOPY (EGD) WITH PROPOFOL N/A 04/24/2021   Surgeon: Eloise Harman, DO;  duodenal erosions and gastritis biopsied (pathology with peptic duodenitis, reactive gastropathy with erosions/chronic inflammation, negative for H. pylori)   EYE SURGERY     Vatrectomy   HEMOSTASIS CLIP PLACEMENT  05/14/2021   Procedure: HEMOSTASIS CLIP PLACEMENT;  Surgeon: Daneil Dolin, MD;  [facility];  Service: Endoscopy;;   IR PERC TUN PERIT CATH WO PORT S&I /IMAG  09/15/2020   IR REMOVAL TUN CV CATH W/O FL  02/19/2021   IR US GUIDE VASC ACCESS RIGHT  09/15/2020   POLYPECTOMY  04/24/2021   Procedure: POLYPECTOMY;  Surgeon: Eloise Harman, DO;  [facility];  Service: Endoscopy;;   POLYPECTOMY  05/14/2021   Procedure: POLYPECTOMY;  Surgeon: Daneil Dolin, MD;  [facility];  Service: Endoscopy;;   SKIN SPLIT GRAFT Bilateral 09/03/2021   Procedure: SKIN GRAFT BILATERAL LEGS;  Surgeon: Newt Minion, MD;  Location: [place];  Service: Orthopedics;  Laterality: Bilateral;   SKIN SPLIT GRAFT Left 12/10/2021   Procedure: IRRIGATION AND DEBRIDEMENT LEFT CALF, APPLICATION SPLIT THICKNESS SKIN GRAFT;  Surgeon: Newt Minion, MD;  Location: [place];  Service: Orthopedics;  Laterality: Left;   TOE SURGERY     Social History:  reports that she has never smoked. She has never used smokeless tobacco. She reports that she does not drink alcohol and does not use drugs.  Allergies  Allergen Reactions   Ace Inhibitors Cough    Family History  Problem Relation Age of Onset   Heart failure Mother    Heart disease Mother    Diabetes Mother    Kidney disease Mother    Heart failure Father    Diabetes Father    Heart disease Father    Diabetes Brother    Heart failure Maternal Grandmother    Heart failure Maternal Grandfather    Transient ischemic attack Maternal Grandfather     Colon cancer Neg Hx     Prior to Admission medications   Medication Sig Start Date End Date Taking? Authorizing Provider  acetaminophen (TYLENOL) 500 MG tablet Take 1,000 mg by mouth every 6 (six) hours as needed for moderate pain.    [provider]  atorvastatin (LIPITOR) 10 MG tablet Take 1 tablet (10 mg total) by mouth daily. Patient taking differently: Take 10 mg by mouth at bedtime. 04/25/21   Roxan Hockey, MD  AURYXIA 1 GM 210 MG(Fe) tablet Take 420 mg by mouth 2 (two) times daily with a meal. 01/07/22   [provider]  Blood Glucose Monitoring Suppl (ACCU-CHEK GUIDE ME) w/Device KIT 1 Piece by Does not apply route as directed. 03/16/22   Cassandria Anger, MD  Blood Pressure Monitor KIT TAKE BLOOD PRESSURE DAILY 09/15/22   Skeet Latch, MD  Continuous Blood Gluc Receiver (FREESTYLE LIBRE 2 READER) DEVI As directed 04/06/22   Cassandria Anger, MD  Continuous Blood  Gluc Sensor (FREESTYLE LIBRE 2 SENSOR) MISC 1 Piece by Does not apply route every 14 (fourteen) days. 04/06/22   Cassandria Anger, MD  glucose blood (ACCU-CHEK GUIDE) test strip Use as instructed 03/16/22   Cassandria Anger, MD  HUMALOG KWIKPEN 100 UNIT/ML KwikPen Inject 5-11 Units into the skin 3 (three) times daily with meals. If eats 50% or more of meal. 03/16/22   Nida, Marella Chimes, MD  insulin glargine (LANTUS) 100 UNIT/ML injection Inject 0.2 mLs (20 Units total) into the skin at bedtime. 04/06/22   Cassandria Anger, MD  levothyroxine (SYNTHROID) 75 MCG tablet Take 75 mcg by mouth daily. 06/13/22   [provider]  midodrine (PROAMATINE) 2.5 MG tablet TAKE 1 TABLET 1 HOUR PRIOR TO DIALYSIS 09/12/22   Skeet Latch, MD  pantoprazole (PROTONIX) 40 MG tablet Take 1 tablet (40 mg total) by mouth 2 (two) times daily. 04/25/21 09/12/22  Roxan Hockey, MD  polyethylene glycol (MIRALAX / GLYCOLAX) 17 g packet Take 17 g by mouth daily as needed for mild constipation.  02/19/22   Nita Sells, MD  silver sulfADIAZINE (SILVADENE) 1 % cream Apply 1 application. topically daily. Apply to affected area daily plus dry dressing 02/24/22   Newt Minion, MD  torsemide (DEMADEX) 100 MG tablet Take 100 mg by mouth daily. 08/05/22   [provider]  Vitamin D, Ergocalciferol, (DRISDOL) 1.25 MG (50000 UNIT) CAPS capsule Take 50,000 Units by mouth every 7 (seven) days. 04/20/21   [provider]    Physical Exam: Vitals:   10/01/22 0307 10/01/22 0331 10/01/22 0331 10/01/22 0459  BP: (!) _0 (!) 170/75  Pulse: 71 73 71 64  Resp: _1 Temp: 97.8 F (36.6 C) 97.6 F (36.4 C) 97.6 F (36.4 C) 98.3 F (36.8 C)  TempSrc: Oral Oral Oral Oral  SpO2: 99% 100% 100% 95%  Weight: 94.4 kg     Height: _2  (1.676 m)      1.  General: Patient lying supine in bed,  no acute distress   2. Psychiatric: Alert and oriented x 3, mood and behavior normal for situation, pleasant and cooperative with exam   3. Neurologic: Speech and language are normal, face is symmetric, chronic atrophy of bilateral hands, weakness in bilateral lower extremities-chronic, no acute deficits, at baseline   4. HEENMT:  Head is atraumatic, normocephalic, pupils reactive to light, neck is supple, trachea is midline, mucous membranes are moist   5. Respiratory : Lungs are clear to auscultation bilaterally without wheezing, rhonchi, rales, no cyanosis, no increase in work of breathing or accessory muscle use   6. Cardiovascular : Heart rate normal, rhythm is regular, heart murmur present, rubs or gallops, no peripheral edema, peripheral pulses palpated   7. Gastrointestinal:  Abdomen is soft, nondistended, nontender to palpation bowel sounds active, no masses or organomegaly palpated   8. Skin:  Bilateral lower extremities wrapped in 4-layer compression dressing, stage III pressure ulcer on left buttock and stage II on right   9.Musculoskeletal:  No  acute deformities or trauma, no asymmetry in tone, no peripheral edema, peripheral pulses palpated, no tenderness to palpation in the extremities  Data Reviewed: In the ED Temp 97.4, heart rate 70-72, respiratory 16-17, blood pressure 91/60-105/65, satting at 97% No leukocytosis with a white blood cell count of 6.5, hemoglobin 11.7, platelets 142 Chemistry reveals an elevated BUN 39, elevated creatinine 5.45 in this ESRD patient Hyperglycemia 184 FOBT positive Fentanyl, midodrine, 1  L bolus, and Protonix given in the ED CTA abdomen shows no evidence of acute GI bleed GI consulted and plans to see patient in the a.m. Admission requested for GI bleed  Assessment and Plan: * GI bleed - FOBT positive - Significant blood in stool, patient has a picture on her phone - No symptoms of anemia - Hemoglobin stable at 11.7 - This is painless bleeding - Continue Protonix twice daily - GI was consulted by the ED and plans to see patient in the a.m. - N.p.o. until cleared by GI - CTA shows no evidence of active GI bleed, thickened appearance of rectum that may be related to underdistention or present mild proctitis, and hepatomegaly that could be hepatitis per radiology, but does not fit the clinical picture - Trend hemoglobin in the a.m. - Continue to monitor  ESRD (end stage renal disease) on dialysis [facility]) - On hemodialysis for 2 years - Schedule is Tuesday Thursday Saturday, patient has not missed any dialysis and she is due today - Inpatient consult to nephrology ordered, and epic chat sent to the doctor on-call  Type 2 diabetes mellitus with ESRD (end-stage renal disease) ([place]) - 20 units of insulin nightly at baseline - Continue reduced dose of basal insulin while in the hospital - Sliding scale coverage - Currently n.p.o.  Acquired hypothyroidism - Continue Synthroid  Pressure injury of skin - Air mattress ordered - Wound consult ordered - Continue to monitor  Calciphylaxis -  Dressings on bilateral lower extremities due to calciphylaxis - Wound consult - Continue to monitor      Advance Care Planning:   Code Status: Full Code   Consults: Nephrology and GI  Family Communication: No family at bedside  Severity of Illness: The appropriate patient status for this patient is OBSERVATION. Observation status is judged to be reasonable and necessary in order to provide the required intensity of service to ensure the patient's safety. The patient's presenting symptoms, physical exam findings, and initial radiographic and laboratory data in the context of their medical condition is felt to place them at decreased risk for further clinical deterioration. Furthermore, it is anticipated that the patient will be medically stable for discharge from the hospital within 2 midnights of admission.   Author: Rolla Plate, DO 10/01/2022 6:00 AM  For on call review [email or web address].

## 2022-10-01 NOTE — TOC Progression Note (Signed)
Transition of Care [facility]) - Progression Note    Patient Details  Name: Amy Moses MRN: [id number] Date of Birth: 12-18-1970  Transition of Care [name]) CM/SW Contact  Salome Arnt, [place] Phone Number: 10/01/2022, 1:14 PM  Clinical Narrative:   Transition of Care (TOC) Screening Note   Patient Details  Name: Amy Moses Date of Birth: [birth year]   Transition of Care [facility]) CM/SW Contact:    Salome Arnt, [place] Phone Number: 10/01/2022, 1:14 PM    Transition of Care Department [facility]) has reviewed patient and no TOC needs have been identified at this time. We will continue to monitor patient advancement through interdisciplinary progression rounds. If new patient transition needs arise, please place a TOC consult.            Expected Discharge Plan and Services                                                 Social Determinants of Health (SDOH) Interventions    Readmission Risk Interventions    05/02/2022    9:34 AM 01/26/2022    2:26 PM 05/14/2021    2:46 PM  Readmission Risk Prevention Plan  Transportation Screening Complete Complete Complete  PCP or Specialist Appt within 3-5 Days  Complete   HRI or [place]  Complete   Social Work Consult for [place] Planning/Counseling  Complete   Palliative Care Screening  Not Applicable   Medication Review [employer]) Complete Complete Complete  HRI or Home Care Consult Complete  Complete  SW Recovery Care/Counseling Consult Complete  Complete  Palliative Care Screening Not Applicable  Not [place] Not Applicable  Not Applicable

## 2022-10-01 NOTE — Progress Notes (Signed)
Patient arrived to floor alert and oriented.  Patient has old healing wound on bilateral thighs from previous wound vacs. Patient also has wounds wrapped to bilateral legs which are wrapped and and being followed by wound care facility.  Bilateral leg wounds were wrapped today and patient request wrapping not be removed.  Patient does have a stage 3 to  left buttock, posterior thigh measuring 3cm x 1 cm.patient has stage 2 measuring  1cm x 1 cm to right buttock posterior thigh.  Patient also have stage1 x 1 stage 3 to right buttock posterior thigh.

## 2022-10-01 NOTE — Care Management Obs Status (Signed)
[place] NOTIFICATION   Patient Details  Name: Amy Moses MRN: [id number] Date of Birth: 1971-11-16   Medicare Observation Status Notification Given:  Yes    Salome Arnt, [place] 10/01/2022, 8:27 AM

## 2022-10-01 NOTE — Assessment & Plan Note (Signed)
Continue Synthroid °

## 2022-10-02 ENCOUNTER — Encounter (HOSPITAL_COMMUNITY): Payer: Self-pay | Admitting: Anesthesiology

## 2022-10-02 ENCOUNTER — Telehealth: Payer: Self-pay | Admitting: Internal Medicine

## 2022-10-02 ENCOUNTER — Encounter (HOSPITAL_COMMUNITY): Payer: Self-pay | Admitting: Family Medicine

## 2022-10-02 DIAGNOSIS — K922 Gastrointestinal hemorrhage, unspecified: Secondary | ICD-10-CM | POA: Diagnosis not present

## 2022-10-02 DIAGNOSIS — D638 Anemia in other chronic diseases classified elsewhere: Secondary | ICD-10-CM

## 2022-10-02 DIAGNOSIS — K219 Gastro-esophageal reflux disease without esophagitis: Secondary | ICD-10-CM | POA: Diagnosis not present

## 2022-10-02 DIAGNOSIS — I10 Essential (primary) hypertension: Secondary | ICD-10-CM | POA: Diagnosis not present

## 2022-10-02 DIAGNOSIS — E1122 Type 2 diabetes mellitus with diabetic chronic kidney disease: Secondary | ICD-10-CM | POA: Diagnosis not present

## 2022-10-02 LAB — CBC
HCT: 31.9 % — ABNORMAL LOW (ref 36.0–46.0)
Hemoglobin: 10.1 g/dL — ABNORMAL LOW (ref 12.0–15.0)
MCH: 30.1 pg (ref 26.0–34.0)
MCHC: 31.7 g/dL (ref 30.0–36.0)
MCV: 95.2 fL (ref 80.0–100.0)
Platelets: 105 10*3/uL — ABNORMAL LOW (ref 150–400)
RBC: 3.35 MIL/uL — ABNORMAL LOW (ref 3.87–5.11)
RDW: 13.8 % (ref 11.5–15.5)
WBC: 5.2 10*3/uL (ref 4.0–10.5)
nRBC: 0 % (ref 0.0–0.2)

## 2022-10-02 LAB — HEPATITIS B SURFACE ANTIBODY, QUANTITATIVE: Hep B S AB Quant (Post): <3.1 m[IU]/mL — ABNORMAL LOW (ref >9.9)

## 2022-10-02 LAB — GLUCOSE, CAPILLARY
Glucose-Capillary: 68 mg/dL — ABNORMAL LOW (ref 70–99)
Glucose-Capillary: 159 mg/dL — ABNORMAL HIGH (ref 70–99)
Glucose-Capillary: 149 mg/dL — ABNORMAL HIGH (ref 70–99)
Glucose-Capillary: 171 mg/dL — ABNORMAL HIGH (ref 70–99)

## 2022-10-02 SURGERY — COLONOSCOPY WITH PROPOFOL
Anesthesia: Monitor Anesthesia Care

## 2022-10-02 MED ORDER — HYDROCORTISONE ACETATE 25 MG RE SUPP: 25.0000 mg | Freq: Two times a day (BID) | RECTAL | 0 refills | Status: DC | PRN

## 2022-10-02 MED ORDER — POLYETHYLENE GLYCOL 3350 17 G PO PACK: 17.0000 g | PACK | Freq: Every day | ORAL | 1 refills | Status: DC

## 2022-10-02 MED ORDER — CHLORHEXIDINE GLUCONATE CLOTH 2 % EX PADS
6.0000 | MEDICATED_PAD | Freq: Every day | CUTANEOUS | Status: DC
  Administered 2022-10-02: 6 via TOPICAL

## 2022-10-02 NOTE — Plan of Care (Signed)

## 2022-10-02 NOTE — Discharge Summary (Signed)
Physician Discharge Summary   Patient: Amy Moses MRN: [id number] DOB: February 09, 1971  Admit date:     09/30/2022  Discharge date: 10/02/22  Discharge Physician: Barton Dubois   PCP: Emelda Fear, DO   Recommendations at discharge:  Repeat CBC to follow hemoglobin trend/stability Continue close monitoring of patient's CBGs/A1c with further adjustment to hypoglycemia regimen as required. Reassess blood pressure and adjust antihypertensive treatment as needed.  Discharge Diagnoses: Principal Problem:   GI bleed Active Problems:   ESRD (end stage renal disease) on dialysis ([place])   Type 2 diabetes mellitus with ESRD (end-stage renal disease) ([place])   Acquired hypothyroidism   Primary hypertension   Anemia of chronic disease   Calciphylaxis   Pressure injury of skin   Proctitis    Hospital Course: As per H&P written by Dr. Clearence Ped on 10/01/22 Amy Moses is a 51 y.o. female with medical history significant of blindness of the right eye, chronic diastolic CHF, diabetes mellitus type 2, dyslipidemia, hypertension, hypothyroidism, ESRD on HD, pulmonary hypertension, and more presents to the ED with chief complaint of blood in her stool.  Patient reports that on the 10th around 1:30 PM, she felt the urge to evacuate her bowels, and when she when she thought it was loose stool until she saw that the toilet was full of blood.  This happened 1 time at home and it happened 2-3 times here in the hospital.  Is painless bleeding.  She had no nausea.  Her appetite is intact, although she has not eaten since the ninth due to her schedule and being held n.p.o. when she arrived to the hospital.  Patient reports no shortness of breath, paresthesias, lightheadedness, chest pain, palpitations, or other symptoms of anemia.  Patient reports that she has had a transfusion before within the last couple of years.  It was not due to GI bleed and just her hemoglobin was low at that time.  Patient does have  ESRD, she is on HD on Tuesday/Thursday/Saturday.  Patient denies any other bleeding including no epistaxis, no vaginal bleeding, no big bruises.  She does not make urine, so there is obviously no hematuria.  Patient has no other complaints at this time and reports that she was otherwise in her normal state of health.  Patient does have calciphylaxis, and per bilateral lower extremities have 4-layer wraps due to this calciphylaxis.  Wound consult ordered.   Patient does not smoke, does not drink, does not use illicit drugs and she is vaccinated for COVID.  Patient is full code.  Assessment and Plan: * GI bleed -FOBT positive - Significant blood in stool, which appears to be secondary to proctitis as per gastroenterology service evaluation. - Hemoglobin has remained stable at 11.7 - Safe for patient to be discharged home with continuation of iron and daily MiraLAX. -As needed Anusol also prescribed. -Outpatient follow-up with GI will be arranged and if needed colonoscopy will be done.    ESRD (end stage renal disease) on dialysis [facility]) -On hemodialysis for 2 years; T-T-S - Received hemodialysis on 10/01/2022 while inpatient -Continue outpatient hemodialysis treatments as previously arranged. -Adequate hydration and low-sodium diet discussed with patient.  Type 2 diabetes mellitus with ESRD (end-stage renal disease) ([place]) - Resume home hypoglycemic regimen -Modified carbohydrate diet discussed with patient.  Acquired hypothyroidism -Continue Synthroid  Primary hypertension -Stable overall -Advised to follow heart healthy/low-sodium diet -Patient with a component of orthostasis especially around dialysis -Patient to continue the use of midodrine 3 times daily -Continue home  Demadex.  Proctitis -As needed Anusol and daily MiraLAX as recommended by GI service has been prescribed. -Patient advised to maintain adequate hydration. -Active bleeding at time of discharge.  Pressure injury of  skin - No signs of superimposed infection -Continue preventive measures -Continue outpatient follow-up with wound care service.  Calciphylaxis - Dressings on bilateral lower extremities due to calciphylaxis - Continue outpatient wound care services.  Anemia of chronic disease -In the setting of end-stage renal disease -IV iron and Epogen therapy as per nephrology discretion -Hemoglobin has remained stable.   Consultants: Nephrology service; gastroenterology. Procedures performed: See below for x-ray reports. Disposition: Home Diet recommendation: Heart healthy modified carbohydrate diet.  DISCHARGE MEDICATION: Allergies as of 10/02/2022       Reactions   Ace Inhibitors Cough        Medication List     TAKE these medications    Accu-Chek Guide Me w/Device Kit 1 Piece by Does not apply route as directed.   Accu-Chek Guide test strip Generic drug: glucose blood Use as instructed   acetaminophen 500 MG tablet Commonly known as: TYLENOL Take 1,000 mg by mouth every 6 (six) hours as needed for moderate pain.   atorvastatin 10 MG tablet Commonly known as: LIPITOR Take 1 tablet (10 mg total) by mouth daily. What changed: when to take this   Auryxia 1 GM 210 MG(Fe) tablet Generic drug: ferric citrate Take 630 mg by mouth 2 (two) times daily with a meal.   Blood Pressure Monitor Kit TAKE BLOOD PRESSURE DAILY   FreeStyle Libre 2 Reader [name] As directed   [employer] 2 Sensor Misc 1 Piece by Does not apply route every 14 (fourteen) days.   HumaLOG KwikPen 100 UNIT/ML KwikPen Generic drug: insulin lispro Inject 5-11 Units into the skin 3 (three) times daily with meals. If eats 50% or more of meal.   hydrocortisone 25 MG suppository Commonly known as: ANUSOL-HC Place 1 suppository (25 mg total) rectally 2 (two) times daily as needed for hemorrhoids or anal itching (pain.).   insulin glargine 100 UNIT/ML injection Commonly known as: LANTUS Inject 0.2 mLs  (20 Units total) into the skin at bedtime. What changed: how much to take   levothyroxine 75 MCG tablet Commonly known as: SYNTHROID Take 75 mcg by mouth daily.   midodrine 5 MG tablet Commonly known as: PROAMATINE Take 5 mg by mouth See admin instructions. Take 1 hour prior to dialysis   pantoprazole 40 MG tablet Commonly known as: Protonix Take 1 tablet (40 mg total) by mouth 2 (two) times daily.   polyethylene glycol 17 g packet Commonly known as: MIRALAX / GLYCOLAX Take 17 g by mouth daily. What changed:  when to take this reasons to take this   torsemide 100 MG tablet Commonly known as: DEMADEX Take 100 mg by mouth daily.   Vitamin D (Ergocalciferol) 1.25 MG (50000 UNIT) Caps capsule Commonly known as: DRISDOL Take 50,000 Units by mouth every 7 (seven) days. Friday               Discharge Care Instructions  (From admission, onward)           Start     Ordered   10/02/22 0000  Discharge wound care:       Comments: Resume outpatient follow-up as previously scheduled with wound care service.   10/02/22 1213            Follow-up Information     Emelda Fear, DO. Schedule an appointment  as soon as possible for a visit in 10 day(s).   Specialty: Family Medicine Contact information: [address] [place] [phone]                Discharge Exam: Filed Weights   09/30/22 1921 10/01/22 0307 10/01/22 1515  Weight: 96 kg 94.4 kg 94.3 kg   General exam: Alert, awake, oriented x 3; in no acute distress.  No further signs of overt bleeding appreciated. Respiratory system: Clear to auscultation. Respiratory effort normal.  Good saturation on room air.  No using accessory muscles. Cardiovascular system:RRR. No rubs or gallops. Gastrointestinal system: Abdomen is nondistended, soft and nontender. No organomegaly or masses felt. Normal bowel sounds heard. Central nervous system: Alert and oriented. No focal neurological  deficits. Extremities: With chronic Unna boots dressings in place; no cyanosis. Skin: Chronic pressure injury and calciphylaxis lesions without signs of superimposed infection. Psychiatry: Judgement and insight appear normal. Mood & affect appropriate.    Condition at discharge: good  The results of significant diagnostics from this hospitalization (including imaging, microbiology, ancillary and laboratory) are listed below for reference.   Imaging Studies: CT ANGIO GI BLEED  Result Date: 10/01/2022 CLINICAL DATA:  Lower GI bleed. EXAM: CTA ABDOMEN AND PELVIS WITHOUT AND WITH CONTRAST TECHNIQUE: Multidetector CT imaging of the abdomen and pelvis was performed using the standard protocol during bolus administration of intravenous contrast. Multiplanar reconstructed images and MIPs were obtained and reviewed to evaluate the vascular anatomy. RADIATION DOSE REDUCTION: This exam was performed according to the departmental dose-optimization program which includes automated exposure control, adjustment of the mA and/or kV according to patient size and/or use of iterative reconstruction technique. CONTRAST:  160m OMNIPAQUE IOHEXOL 350 MG/ML SOLN COMPARISON:  None Available. FINDINGS: VASCULAR Aorta: Moderate atherosclerotic calcification of the aorta, advanced for patient's age. No aneurysmal dilatation or dissection. No periaortic fluid collection. Celiac: Atherosclerotic calcification the celiac trunk and major branches. These vessels however remain patent. SMA: The SMA is patent. Renals: The renal arteries are patent. IMA: The IMA is patent. Inflow: Moderate atherosclerotic calcification of the iliac arteries. The iliac arteries remain patent. Proximal Outflow: Atherosclerotic calcification. The visualized common femoral arteries and proximal deep and superficial femoral arteries are patent. Veins: The IVC is unremarkable. The SMV, splenic vein, and main portal vein are patent. No portal venous gas.  Review of the MIP images confirms the above findings. NON-VASCULAR Lower chest: The visualized lung bases are clear. Three vessel coronary vascular calcification. Partially visualized small pericardial effusion measuring 7 mm in thickness. No intra-abdominal free air or free fluid. Hepatobiliary: The liver is enlarged measuring approximately 18 cm in midclavicular length. There is slight heterogeneous enhancement of the liver. Correlation with LFTs recommended. Mild biliary dilatation, likely post cholecystectomy. Pancreas: Unremarkable. No pancreatic ductal dilatation or surrounding inflammatory changes. Spleen: Normal in size without focal abnormality. Adrenals/Urinary Tract: The adrenal glands are unremarkable. Small bilateral renal parenchyma atrophy. There is no hydronephrosis on either side. Bilateral renal vascular calcifications. The visualized ureters are unremarkable. The urinary bladder is collapsed. Stomach/Bowel: Thickened appearance of the rectum may be related to underdistention or represent mild proctitis. Clinical correlation is recommended. There is no bowel obstruction. No evidence of active GI bleed. The appendix is normal. Lymphatic: No intra-abdominal adenopathy. Mildly enlarged bilateral inguinal lymph nodes, likely reactive. Reproductive: Calcified uterine fibroids. No definite lung vessel masses. Other: Mild subcutaneous edema. Musculoskeletal: Osteopenia with degenerative changes of the lower lumbar spine. No acute osseous pathology. IMPRESSION: 1. No evidence  of active GI bleed. 2. Thickened appearance of the rectum may be related to underdistention or represent mild proctitis. No bowel obstruction. Normal appendix. 3. Hepatomegaly with possible hepatitis. Correlation with LFTs recommended. 4. Partially visualized small pericardial effusion measuring 7 mm in thickness. 5. Advanced Aortic Atherosclerosis (ICD10-I70.0). 6. Three vessel coronary vascular calcification. Electronically Signed    By: Anner Crete M.D.   On: 10/01/2022 00:57   VAS US DUPLEX DIALYSIS ACCESS (AVF, AVG)  Result Date: 09/09/2022 DIALYSIS ACCESS Patient Name:  SEBASTIAN DZIK  Date of Exam:   09/09/2022 Medical Rec #: [id number]        Accession #:    [id number] Date of Birth: 1971/05/11       Patient Gender: F Patient Age:   51 years Exam Location:  [facility] Procedure:      VAS US DUPLEX DIALYSIS ACCESS (AVF, AVG) Referring Phys: Vonna Kotyk ROBINS --------------------------------------------------------------------------------  Reason for Exam: Difficulty cannulating. Access Site: Left Upper Extremity. Access Type: Brachial-cephalic AVF. History: Created in 07/2020. Performing Technologist: Ralene Cork RVT  Examination Guidelines: A complete evaluation includes B-mode imaging, spectral Doppler, color Doppler, and power Doppler as needed of all accessible portions of each vessel. Unilateral testing is considered an integral part of a complete examination. Limited examinations for reoccurring indications may be performed as noted.  Findings: +--------------------+----------+-----------------+--------+ AVF                 PSV (cm/s)Flow Vol (mL/min)Comments +--------------------+----------+-----------------+--------+ Native artery inflow   149           570                +--------------------+----------+-----------------+--------+ AVF Anastomosis        361                              +--------------------+----------+-----------------+--------+  +------------+----------+-------------+----------+--------+ OUTFLOW VEINPSV (cm/s)Diameter (cm)Depth (cm)Describe +------------+----------+-------------+----------+--------+ Prox UA         38        0.58        1.17            +------------+----------+-------------+----------+--------+ Mid UA          23        1.53        0.41            +------------+----------+-------------+----------+--------+ Dist UA        265         1.27        0.58   tortous  +------------+----------+-------------+----------+--------+ AC Fossa       282        0.68        0.24            +------------+----------+-------------+----------+--------+  Summary: Patent left brachio cephalic AFV. Tortuousity in the distal outflow vein. Velocities <100 cm/s in the mid and proximal outflow vein.  *See table(s) above for measurements and observations.  Diagnosing physician: Orlie Pollen Electronically signed by Orlie Pollen on 09/09/2022 at 4:41:16 PM.    --------------------------------------------------------------------------------   Final     Microbiology: Results for orders placed or performed during the hospital encounter of 09/30/22  C Difficile Quick Screen w PCR reflex     Status: None   Collection Time: 10/01/22  1:07 AM   Specimen: STOOL  Result Value Ref Range Status   C Diff antigen NEGATIVE NEGATIVE Final   C Diff toxin NEGATIVE NEGATIVE Final  C Diff interpretation No C. difficile detected.  Final    Comment: Performed at [facility], [address] [place], [place] [address]    Labs: CBC: Recent Labs  Lab 09/30/22 1949 10/01/22 0456 10/02/22 0451  WBC 6.5 5.2 5.2  HGB 11.7* 10.2* 10.1*  HCT 36.6 32.1* 31.9*  MCV 94.6 94.4 95.2  PLT 142* 111* 841*   Basic Metabolic Panel: Recent Labs  Lab 09/30/22 1949 10/01/22 0456  NA 135 135  K 4.3 3.8  CL 96* 98  CO2 29 26  GLUCOSE 184* 182*  BUN 39* 41*  CREATININE 5.45* 5.80*  CALCIUM 9.3 8.7*  MG  --  2.4  PHOS  --  6.6*   Liver Function Tests: Recent Labs  Lab 09/30/22 1949 10/01/22 0456  AST 14* 12*  ALT 13 10  ALKPHOS 130* 114  BILITOT 0.9 0.7  PROT 7.3 6.1*  ALBUMIN 3.9 3.3*   CBG: Recent Labs  Lab 10/01/22 2127 10/02/22 0123 10/02/22 0206 10/02/22 0715 10/02/22 1116  GLUCAP 187* 68* 159* 149* 171*    Discharge time spent: greater than 30 minutes.  Signed: Barton Dubois, MD [facility] 10/02/2022

## 2022-10-02 NOTE — Progress Notes (Signed)
[facility] Progress Note   51 y.o. female with DM, blindness in the right eye, dCHF, HLD, hypothyroidism, pulmonary HTN and ESRD-  TTS [name] with last treatment on 11/9.  She presents with blood in her stool. This was also witnessed in the hosp. Patient has calciphylaxis and the lower extremities are wrapped because of this.   Dialysis Orders TTS [name] 4hrs EDW 93kg 15ga 400/800  2/2.5 Heparin 2k bolus -> 400u/hr 3400 Epogen Sensipar 30mg  PO TIW  Assessment/ Plan:   ESRD Will continue with TTS schedule. Obtained orders from her clinic. Tolerated HD treatment on 11/11 with 2L net UF.  No absolute indication for RRT and the patient appears to be  comfortable.  Next HD on Tuesday @ her unit. Current weight 94.3kg.   Anemia:stable. Hb >10 -> per GI colonoscopy as outpt.  CKD-MBD: continue with binders when she's taking PO's, phos 6.6 Nutrition:renal diet, carb modified Hypertension: stable Calciphylaxis: Has been on NaThiosulfate but has been taken off H/o Enterobacterales bacteremia:   H/o foot ulcers seen previously by Dr. [name]  Subjective:   Feeling better, no further episodes of hematochezia. Denies f/cn/v. Consumed 1/2 of prep and currently eating a burger.    Objective:   BP (!) 92/56 (BP Location: Right Arm)   Pulse 69   Temp 98.2 F (36.8 C) (Oral)   Resp 18   Ht 5\' 6"  (1.676 m)   Wt 94.3 kg   LMP 08/22/2015 (Approximate)   SpO2 98%   BMI 33.55 kg/m   Intake/Output Summary (Last 24 hours) at 10/02/2022 1227 Last data filed at 10/02/2022 0200 Gross per 24 hour  Intake 2400 ml  Output 2000 ml  Net 400 ml   Weight change: -1.7 kg  Physical Exam: Gen:NAD CVS: RRR Resp: CTA B/L, no rales Abd: +BS, soft, NT/ND Ext: calciphylaxis of thighs, calves wrapped, LUE AVF +B/T Neuro: grossly intact Neck: No noted JVD  Imaging: CT ANGIO GI BLEED  Result Date: 10/01/2022 CLINICAL DATA:  Lower GI bleed. EXAM: CTA  ABDOMEN AND PELVIS WITHOUT AND WITH CONTRAST TECHNIQUE: Multidetector CT imaging of the abdomen and pelvis was performed using the standard protocol during bolus administration of intravenous contrast. Multiplanar reconstructed images and MIPs were obtained and reviewed to evaluate the vascular anatomy. RADIATION DOSE REDUCTION: This exam was performed according to the departmental dose-optimization program which includes automated exposure control, adjustment of the mA and/or kV according to patient size and/or use of iterative reconstruction technique. CONTRAST:  184mL OMNIPAQUE IOHEXOL 350 MG/ML SOLN COMPARISON:  None Available. FINDINGS: VASCULAR Aorta: Moderate atherosclerotic calcification of the aorta, advanced for patient's age. No aneurysmal dilatation or dissection. No periaortic fluid collection. Celiac: Atherosclerotic calcification the celiac trunk and major branches. These vessels however remain patent. SMA: The SMA is patent. Renals: The renal arteries are patent. IMA: The IMA is patent. Inflow: Moderate atherosclerotic calcification of the iliac arteries. The iliac arteries remain patent. Proximal Outflow: Atherosclerotic calcification. The visualized common femoral arteries and proximal deep and superficial femoral arteries are patent. Veins: The IVC is unremarkable. The SMV, splenic vein, and main portal vein are patent. No portal venous gas. Review of the MIP images confirms the above findings. NON-VASCULAR Lower chest: The visualized lung bases are clear. Three vessel coronary vascular calcification. Partially visualized small pericardial effusion measuring 7 mm in thickness. No intra-abdominal free air or free fluid. Hepatobiliary: The liver is enlarged measuring approximately 18 cm in midclavicular length. There is slight heterogeneous enhancement of the  liver. Correlation with LFTs recommended. Mild biliary dilatation, likely post cholecystectomy. Pancreas: Unremarkable. No pancreatic ductal  dilatation or surrounding inflammatory changes. Spleen: Normal in size without focal abnormality. Adrenals/Urinary Tract: The adrenal glands are unremarkable. Small bilateral renal parenchyma atrophy. There is no hydronephrosis on either side. Bilateral renal vascular calcifications. The visualized ureters are unremarkable. The urinary bladder is collapsed. Stomach/Bowel: Thickened appearance of the rectum may be related to underdistention or represent mild proctitis. Clinical correlation is recommended. There is no bowel obstruction. No evidence of active GI bleed. The appendix is normal. Lymphatic: No intra-abdominal adenopathy. Mildly enlarged bilateral inguinal lymph nodes, likely reactive. Reproductive: Calcified uterine fibroids. No definite lung vessel masses. Other: Mild subcutaneous edema. Musculoskeletal: Osteopenia with degenerative changes of the lower lumbar spine. No acute osseous pathology. IMPRESSION: 1. No evidence of active GI bleed. 2. Thickened appearance of the rectum may be related to underdistention or represent mild proctitis. No bowel obstruction. Normal appendix. 3. Hepatomegaly with possible hepatitis. Correlation with LFTs recommended. 4. Partially visualized small pericardial effusion measuring 7 mm in thickness. 5. Advanced Aortic Atherosclerosis (ICD10-I70.0). 6. Three vessel coronary vascular calcification. Electronically Signed   By: Anner Crete M.D.   On: 10/01/2022 00:57    Labs: BMET Recent Labs  Lab 09/30/22 1949 10/01/22 0456  NA 135 135  K 4.3 3.8  CL 96* 98  CO2 29 26  GLUCOSE 184* 182*  BUN 39* 41*  CREATININE 5.45* 5.80*  CALCIUM 9.3 8.7*  PHOS  --  6.6*   CBC Recent Labs  Lab 09/30/22 1949 10/01/22 0456 10/02/22 0451  WBC 6.5 5.2 5.2  HGB 11.7* 10.2* 10.1*  HCT 36.6 32.1* 31.9*  MCV 94.6 94.4 95.2  PLT 142* 111* 105*    Medications:     atorvastatin  10 mg Oral QHS   Chlorhexidine Gluconate Cloth  6 each Topical Q0600   ferric citrate   420 mg Oral BID WC   insulin aspart  0-15 Units Subcutaneous TID WC   insulin aspart  0-5 Units Subcutaneous QHS   insulin detemir  13 Units Subcutaneous QHS   midodrine  5 mg Oral TID WC   pantoprazole  40 mg Oral BID   torsemide  100 mg Oral Daily      Otelia Santee, MD 10/02/2022, 12:27 PM

## 2022-10-02 NOTE — Progress Notes (Signed)
Called into room and patient stating she felt sick, blood sugar was dropping and her meter showing 66. Juice given at this time, checked with CBG machine with reading of 68. Snacks and more juice given, CBG 159 within 30 mins. Will continue to monitor.

## 2022-10-02 NOTE — Assessment & Plan Note (Signed)
-  In the setting of end-stage renal disease -IV iron and Epogen therapy as per nephrology discretion -Hemoglobin has remained stable.

## 2022-10-02 NOTE — Telephone Encounter (Signed)
Please arrange hospital follow-up visit with me in 2 to 3 weeks.  Okay to use urgent spot if needed.  Thank you

## 2022-10-02 NOTE — Anesthesia Preprocedure Evaluation (Deleted)
Anesthesia Evaluation  Patient identified by MRN, date of birth, ID band Patient awake    Reviewed: Allergy & Precautions, H&P , NPO status , Patient's Chart, lab work & pertinent test results  Airway        Dental  (+) Dental Advisory Given   Pulmonary pneumonia   Pulmonary exam normal breath sounds clear to auscultation       Cardiovascular hypertension, Pt. on medications Normal cardiovascular exam Rhythm:Regular Rate:Normal  1. Left ventricular ejection fraction, by estimation, is 50 to 55%. The  left ventricle has low normal function. The left ventricle has no regional  wall motion abnormalities. The left ventricular internal cavity size was  mildly dilated. Left ventricular  diastolic parameters are indeterminate. Elevated left ventricular  end-diastolic pressure.   2. Right ventricular systolic function is mildly reduced. The right  ventricular size is moderately enlarged. There is severely elevated  pulmonary artery systolic pressure. The estimated right ventricular  systolic pressure is 85.2 mmHg.   3. Left atrial size was moderately dilated.   4. Right atrial size was mildly dilated.   5. The mitral valve is degenerative. Moderate to severe mitral valve  regurgitation. No evidence of mitral stenosis.   6. Tricuspid valve regurgitation is severe.   7. The aortic valve is calcified. There is moderate calcification of the  aortic valve. There is severe thickening of the aortic valve. Aortic valve  regurgitation is not visualized. Aortic valve sclerosis/calcification is  present, without any evidence of   aortic stenosis.   8. The inferior vena cava is dilated in size with >50% respiratory  variability, suggesting right atrial pressure of 8 mmHg.   9. Small to moderate circumferential pericardial effusion with no Rv  diastolic collapse or RA inversion. Respirophasic changes in MV inflow  velocities was < 25%.      Neuro/Psych negative neurological ROS  negative psych ROS   GI/Hepatic Neg liver ROS,GERD  Medicated,,  Endo/Other  diabetes, Well Controlled, Type 2, Insulin DependentHypothyroidism    Renal/GU ESRF and DialysisRenal disease  negative genitourinary   Musculoskeletal negative musculoskeletal ROS (+)    Abdominal   Peds negative pediatric ROS (+)  Hematology  (+) Blood dyscrasia, anemia   Anesthesia Other Findings   Reproductive/Obstetrics negative OB ROS                             Anesthesia Physical Anesthesia Plan  ASA: 3  Anesthesia Plan: General   Post-op Pain Management: Minimal or no pain anticipated   Induction: Intravenous  PONV Risk Score and Plan: Propofol infusion  Airway Management Planned: Nasal Cannula and Natural Airway  Additional Equipment:   Intra-op Plan:   Post-operative Plan:   Informed Consent: I have reviewed the patients History and Physical, chart, labs and discussed the procedure including the risks, benefits and alternatives for the proposed anesthesia with the patient or authorized representative who has indicated his/her understanding and acceptance.     Dental advisory given  Plan Discussed with: CRNA and Surgeon  Anesthesia Plan Comments:         Anesthesia Quick Evaluation

## 2022-10-02 NOTE — Progress Notes (Signed)
Subjective: Patient denies any further bleeding.  Hemoglobin stable.  No rectal pain.  Ate breakfast this morning.  Objective: Vital signs in last 24 hours: Temp:  [98.2 F (36.8 C)-98.6 F (37 C)] 98.2 F (36.8 C) (11/12 0540) Pulse Rate:  [69-84] 69 (11/12 0540) Resp:  [14-20] 18 (11/12 0540) BP: (92-125)/(56-69) 92/56 (11/12 0540) SpO2:  [98 %-100 %] 98 % (11/12 0540) Weight:  [94.3 kg] 94.3 kg (11/11 1515) Last BM Date : 10/02/22 General:   Alert and oriented, pleasant Head:  Normocephalic and atraumatic. Eyes:  No icterus, sclera clear. Conjuctiva pink.  Abdomen:  Bowel sounds present, soft, non-tender, non-distended. No HSM or hernias noted. No rebound or guarding. No masses appreciated  Msk:  Symmetrical without gross deformities. Normal posture. Extremities:  Without clubbing or edema. Neurologic:  Alert and  oriented x4;  grossly normal neurologically. Skin:  Warm and dry, intact without significant lesions.  Cervical Nodes:  No significant cervical adenopathy. Psych:  Alert and cooperative. Normal mood and affect.  Intake/Output from previous day: 11/11 0701 - 11/12 0700 In: 2880 [P.O.:2880] Out: 2000  Intake/Output this shift: No intake/output data recorded.  Lab Results: Recent Labs    09/30/22 1949 10/01/22 0456 10/02/22 0451  WBC 6.5 5.2 5.2  HGB 11.7* 10.2* 10.1*  HCT 36.6 32.1* 31.9*  PLT 142* 111* 105*   BMET Recent Labs    09/30/22 1949 10/01/22 0456  NA 135 135  K 4.3 3.8  CL 96* 98  CO2 29 26  GLUCOSE 184* 182*  BUN 39* 41*  CREATININE 5.45* 5.80*  CALCIUM 9.3 8.7*   LFT Recent Labs    09/30/22 1949 10/01/22 0456  PROT 7.3 6.1*  ALBUMIN 3.9 3.3*  AST 14* 12*  ALT 13 10  ALKPHOS 130* 114  BILITOT 0.9 0.7   PT/INR Recent Labs    10/01/22 0456  LABPROT 13.7  INR 1.1   Hepatitis Panel Recent Labs    10/01/22 0624  HEPBSAG NON REACTIVE     Studies/Results: CT ANGIO GI BLEED  Result Date: 10/01/2022 CLINICAL DATA:   Lower GI bleed. EXAM: CTA ABDOMEN AND PELVIS WITHOUT AND WITH CONTRAST TECHNIQUE: Multidetector CT imaging of the abdomen and pelvis was performed using the standard protocol during bolus administration of intravenous contrast. Multiplanar reconstructed images and MIPs were obtained and reviewed to evaluate the vascular anatomy. RADIATION DOSE REDUCTION: This exam was performed according to the departmental dose-optimization program which includes automated exposure control, adjustment of the mA and/or kV according to patient size and/or use of iterative reconstruction technique. CONTRAST:  116mL OMNIPAQUE IOHEXOL 350 MG/ML SOLN COMPARISON:  None Available. FINDINGS: VASCULAR Aorta: Moderate atherosclerotic calcification of the aorta, advanced for patient's age. No aneurysmal dilatation or dissection. No periaortic fluid collection. Celiac: Atherosclerotic calcification the celiac trunk and major branches. These vessels however remain patent. SMA: The SMA is patent. Renals: The renal arteries are patent. IMA: The IMA is patent. Inflow: Moderate atherosclerotic calcification of the iliac arteries. The iliac arteries remain patent. Proximal Outflow: Atherosclerotic calcification. The visualized common femoral arteries and proximal deep and superficial femoral arteries are patent. Veins: The IVC is unremarkable. The SMV, splenic vein, and main portal vein are patent. No portal venous gas. Review of the MIP images confirms the above findings. NON-VASCULAR Lower chest: The visualized lung bases are clear. Three vessel coronary vascular calcification. Partially visualized small pericardial effusion measuring 7 mm in thickness. No intra-abdominal free air or free fluid. Hepatobiliary: The liver is enlarged measuring  approximately 18 cm in midclavicular length. There is slight heterogeneous enhancement of the liver. Correlation with LFTs recommended. Mild biliary dilatation, likely post cholecystectomy. Pancreas:  Unremarkable. No pancreatic ductal dilatation or surrounding inflammatory changes. Spleen: Normal in size without focal abnormality. Adrenals/Urinary Tract: The adrenal glands are unremarkable. Small bilateral renal parenchyma atrophy. There is no hydronephrosis on either side. Bilateral renal vascular calcifications. The visualized ureters are unremarkable. The urinary bladder is collapsed. Stomach/Bowel: Thickened appearance of the rectum may be related to underdistention or represent mild proctitis. Clinical correlation is recommended. There is no bowel obstruction. No evidence of active GI bleed. The appendix is normal. Lymphatic: No intra-abdominal adenopathy. Mildly enlarged bilateral inguinal lymph nodes, likely reactive. Reproductive: Calcified uterine fibroids. No definite lung vessel masses. Other: Mild subcutaneous edema. Musculoskeletal: Osteopenia with degenerative changes of the lower lumbar spine. No acute osseous pathology. IMPRESSION: 1. No evidence of active GI bleed. 2. Thickened appearance of the rectum may be related to underdistention or represent mild proctitis. No bowel obstruction. Normal appendix. 3. Hepatomegaly with possible hepatitis. Correlation with LFTs recommended. 4. Partially visualized small pericardial effusion measuring 7 mm in thickness. 5. Advanced Aortic Atherosclerosis (ICD10-I70.0). 6. Three vessel coronary vascular calcification. Electronically Signed   By: Anner Crete M.D.   On: 10/01/2022 00:57    Impression: *Rectal bleeding *Acute blood loss anemia *Abnormal CT *Chronic GERD   Plan: Etiology of patient's rectal bleeding unclear.  Possible stercoral colitis in the setting of chronic constipation given proctitis seen on CT imaging.  Unfortunately, patient ate breakfast today.  Hemoglobin stable.  No further bleeding in 24 hours.  I think it is reasonable to discharge patient home safely today given no further bleeding, updated colonoscopy 1 year ago,  stable Hgb  Recommend she start taking MiraLAX 1 capful daily.  If this is not adequate to keep her bowels moving increase to 2 capfuls daily.  Anusol enema as needed for discomfort.  I will arrange follow-up visit with me in clinic in 2 to 3 weeks.  Can discuss possible outpatient colonoscopy at that time.  Elon Alas. Abbey Chatters, D.O. Gastroenterology and Hepatology [facility]   LOS: 1 day    10/02/2022, 10:03 AM

## 2022-10-02 NOTE — Assessment & Plan Note (Signed)
-  Stable overall -Advised to follow heart healthy/low-sodium diet -Patient with a component of orthostasis especially around dialysis -Patient to continue the use of midodrine 3 times daily -Continue home Demadex.

## 2022-10-02 NOTE — Assessment & Plan Note (Signed)
-  As needed Anusol and daily MiraLAX as recommended by GI service has been prescribed. -Patient advised to maintain adequate hydration. -Active bleeding at time of discharge.

## 2022-10-03 ENCOUNTER — Telehealth: Payer: Self-pay | Admitting: Internal Medicine

## 2022-10-03 LAB — GLUCOSE, CAPILLARY: Glucose-Capillary: 121 mg/dL — ABNORMAL HIGH (ref 70–99)

## 2022-10-03 NOTE — Telephone Encounter (Signed)
This pt will need a visit with Dr. Abbey Chatters. She was in the hospital for 2 days. I know she requested Dr. Abbey Chatters but I have to wait to he gets back with me whether to use a urgent spot or that she can see a APP.  I'm sending this along to Dr Abbey Chatters as well. Dr. Abbey Chatters please advise

## 2022-10-03 NOTE — Telephone Encounter (Signed)
[name] with home health agency left a message stating the patient has blood after a stool and last time this happened it was determined the patient had polyps that needed to be removed.  Trying to determine if the patient needs an appointment with Dr. Abbey Chatters.  Cecille Rubin said we could call her back at [phone]

## 2022-10-04 NOTE — Telephone Encounter (Signed)
Received message from [facility] they could only file with [place] Medicaid Advised patient, verbalized understanding

## 2022-10-05 ENCOUNTER — Ambulatory Visit (INDEPENDENT_AMBULATORY_CARE_PROVIDER_SITE_OTHER): Payer: Medicare Other | Admitting: Family

## 2022-10-05 ENCOUNTER — Encounter: Payer: Self-pay | Admitting: Family

## 2022-10-05 DIAGNOSIS — L97911 Non-pressure chronic ulcer of unspecified part of right lower leg limited to breakdown of skin: Secondary | ICD-10-CM | POA: Diagnosis not present

## 2022-10-05 DIAGNOSIS — L97923 Non-pressure chronic ulcer of unspecified part of left lower leg with necrosis of muscle: Secondary | ICD-10-CM

## 2022-10-05 DIAGNOSIS — I890 Lymphedema, not elsewhere classified: Secondary | ICD-10-CM

## 2022-10-05 DIAGNOSIS — Z89421 Acquired absence of other right toe(s): Secondary | ICD-10-CM | POA: Diagnosis not present

## 2022-10-05 NOTE — Progress Notes (Signed)
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  She is also had ray amputation on the right fifth ray.  For the last 4 weeks she has been having home health assistance for Dynaflex compression wraps.  She is pleased with her progress.  She continues to need significant assistance with her ADLs at home.  She has been using a bedside commode.  She is using a wheelchair for mobility in the home.  She has had significant generalized weakness and deconditioning.   Assessment & Plan: Visit Diagnoses: No diagnosis found.  Plan: Continued improvement.  Near full healing of ulcers on the right lower extremity.  Incision is well-healed today. we will continue with current therapy of once weekly wound cleansing with dial soap and water. Apply dynaflex compression wraps bilaterally.  May weight-bear as tolerated, continue PT  Follow-Up Instructions: Return in about 4 weeks (around 11/02/2022).   Ortho Exam  Patient is alert, oriented, no adenopathy, well-dressed, normal affect, normal respiratory effort.  Truncal edema present.  On examination of the left lower extremity there is stable edema from the area of the wrap distally with improvement in her skin reduced lichenification.  Has a posterior left lower extremity wound which is stable at 6 cm x 2 cm. No depth. There is maceration. No active drainage. no erythema no warmth   To the right lower extremity.  Again control of the edema from the compression wrapping from the area of  the wrap distally.  The calf ulcer which is 2 cm in diameter with surrounding thin friable skin. the ray amputation site ulcer is well-healed. no surrounding drainage erythema warmth   Imaging: No results found. No images are attached to the encounter.  Labs: Lab Results  Component Value Date   HGBA1C 9.4 (H) 10/01/2022   HGBA1C 11.5 (H) 02/15/2022   HGBA1C 13.0 12/30/2021   ESRSEDRATE 34 (H) 02/14/2022   ESRSEDRATE 40 (H) 08/07/2021   CRP 7.0 (H) 02/14/2022   CRP 11.1 (H) 08/07/2021   REPTSTATUS 05/02/2022 FINAL 04/29/2022   GRAMSTAIN  07/08/2021    RARE WBC PRESENT,BOTH PMN AND MONONUCLEAR RARE GRAM POSITIVE RODS RARE GRAM POSITIVE COCCI IN PAIRS    CULT (A) 04/29/2022    CITROBACTER KOSERI SUSCEPTIBILITIES PERFORMED ON PREVIOUS CULTURE WITHIN THE LAST 5 DAYS. Performed at [facility], [place] [address] [place], [place] [address]    LABORGA CITROBACTER KOSERI 04/28/2022     Lab Results  Component Value Date   ALBUMIN 3.3 (L) 10/01/2022   ALBUMIN 3.9 09/30/2022   ALBUMIN 2.9 (L) 05/03/2022   PREALBUMIN 13.7 (L) 08/07/2021    Lab Results  Component Value Date   MG 2.4 10/01/2022   MG 2.1 04/30/2022   MG 2.1 05/14/2021   Lab Results  Component Value Date   VD25OH 36.9 12/30/2021    Lab Results  Component  Value Date   PREALBUMIN 13.7 (L) 08/07/2021      Latest Ref Rng & Units 10/02/2022    4:51 AM 10/01/2022    4:56 AM 09/30/2022    7:49 PM  CBC EXTENDED  WBC 4.0 - 10.5 K/uL 5.2  5.2  6.5   RBC 3.87 - 5.11 MIL/uL 3.35  3.40  3.87   Hemoglobin 12.0 - 15.0 g/dL 10.1  10.2  11.7   HCT 36.0 - 46.0 % 31.9  32.1  36.6   Platelets 150 - 400 K/uL 105  111  142      There is no height or weight on file to calculate BMI.  Orders:  No orders of the defined types were placed in this encounter.  No orders of the defined types were placed in this encounter.    Procedures: No procedures performed  Clinical Data: No additional  findings.  ROS:  All other systems negative, except as noted in the HPI. Review of Systems  Objective: Vital Signs: LMP 08/22/2015 (Approximate)   Specialty Comments:  No specialty comments available.  PMFS History: Patient Active Problem List   Diagnosis Date Noted   Pressure injury of skin 10/01/2022   Proctitis 10/01/2022   Pulmonary hypertension, unspecified ([place]) 09/12/2022   GERD (gastroesophageal reflux disease) 04/30/2022   Bacteremia 04/29/2022   Non-adherence to medical treatment 03/16/2022   Cutaneous abscess of right foot    Osteomyelitis of foot ([place]) 02/14/2022   Obesity (BMI 30-39.9) 01/26/2022   Ischemic ulcer of right foot ([place]) 01/24/2022   Normocytic anemia 01/24/2022   Sacral pressure ulcer 92/42/6834   Metabolic acidosis, increased anion gap 01/24/2022   Leg wound, left, sequela 12/10/2021   Open leg wound 09/03/2021   Wound infection    Non-pressure chronic ulcer of right calf limited to breakdown of skin ([place])    Calciphylaxis of right lower extremity with nonhealing ulcer, limited to breakdown of skin ([place])    Chronic ulcer of left thigh ([place]) 08/07/2021   Calciphylaxis of left lower extremity with nonhealing ulcer with necrosis of muscle ([place]) 08/07/2021   Lower GI bleed 05/12/2021   Acute GI bleeding 04/24/2021   Acute blood loss anemia 04/23/2021   GI bleed 04/22/2021   Fever 03/31/2021   Chronic diastolic heart failure ([place]) 03/30/2021   ESRD (end stage renal disease) on dialysis ([place]) 11/16/2020   Calciphylaxis 11/06/2020   Non-healing open wound of heel 11/03/2020   Diabetic foot infection ([place]) 11/01/2020   Decubitus ulcer, heel 11/01/2020   Closed nondisplaced fracture of left patella 19/62/2297   Metabolic acidosis 98/92/1194   Acute on chronic renal failure ([place]) 06/10/2020   Anemia of chronic disease 06/10/2020   Acute pericardial effusion 06/10/2020   Chronic kidney disease, stage 4 (severe) ([place]) 03/05/2019   Vitamin D  deficiency 01/28/2019   Type 2 diabetes mellitus with ESRD (end-stage renal disease) ([place]) 09/21/2015   Mixed hyperlipidemia 09/21/2015   Primary hypertension 09/21/2015   Acquired hypothyroidism 09/21/2015   Iris bomb 07/31/2012   Secondary angle-closure glaucoma 07/31/2012   Past Medical History:  Diagnosis Date   Anemia    Blindness of right eye with low vision in contralateral eye    s/p victrectomy   Chronic diastolic heart failure ([place]) 03/30/2021   Diabetes mellitus, type II ([place])    Dyslipidemia    Glaucoma    History of blood transfusion    Hypertension    Hypothyroidism (acquired)    Kidney disease    Stage 5  Pneumonia    Pulmonary hypertension, unspecified ([place]) 09/12/2022    Family History  Problem Relation Age of Onset   Heart failure Mother    Heart disease Mother    Diabetes Mother    Kidney disease Mother    Heart failure Father    Diabetes Father    Heart disease Father    Diabetes Brother    Heart failure Maternal Grandmother    Heart failure Maternal Grandfather    Transient ischemic attack Maternal Grandfather    Colon cancer Neg Hx     Past Surgical History:  Procedure Laterality Date   ABDOMINAL AORTOGRAM W/LOWER EXTREMITY Bilateral 12/18/2020   Procedure: ABDOMINAL AORTOGRAM W/LOWER EXTREMITY;  Surgeon: Elam Dutch, MD;  Location: [facility];  Service: Cardiovascular;  Laterality: Bilateral;   ABDOMINAL AORTOGRAM W/LOWER EXTREMITY Bilateral 01/25/2022   Procedure: ABDOMINAL AORTOGRAM W/LOWER EXTREMITY;  Surgeon: Serafina Mitchell, MD;  Location: [facility];  Service: Cardiovascular;  Laterality: Bilateral;   AMPUTATION Right 02/16/2022   Procedure: RIGHT FOOT 5TH RAY AMPUTATION;  Surgeon: Newt Minion, MD;  Location: [facility];  Service: Orthopedics;  Laterality: Right;   ANKLE FRACTURE SURGERY Right    AV FISTULA PLACEMENT Left 08/18/2020   Procedure: LEFT ARM BRACHIOCEPHALIC ARTERIOVENOUS (AV) FISTULA CREATION;  Surgeon:  Elam Dutch, MD;  Location: [facility];  Service: Vascular;  Laterality: Left;   BIOPSY  04/24/2021   Procedure: BIOPSY;  Surgeon: Eloise Harman, DO;  [facility];  Service: Endoscopy;;   CESAREAN SECTION     CHOLECYSTECTOMY     COLONOSCOPY  04/24/2021   Surgeon: Eloise Harman, DO;  nonbleeding internal hemorrhoids, 1 large (25 mm) pedunculated transverse colon polyp (prolapse type polyp) with adherent clot and stigmata of recent bleed.   COLONOSCOPY WITH PROPOFOL N/A 05/14/2021   Procedure: COLONOSCOPY WITH PROPOFOL;  Surgeon: Daneil Dolin, MD;  [facility];  Service: Endoscopy;  Laterality: N/A;   ESOPHAGOGASTRODUODENOSCOPY (EGD) WITH PROPOFOL N/A 04/24/2021   Surgeon: Eloise Harman, DO;  duodenal erosions and gastritis biopsied (pathology with peptic duodenitis, reactive gastropathy with erosions/chronic inflammation, negative for H. pylori)   EYE SURGERY     Vatrectomy   HEMOSTASIS CLIP PLACEMENT  05/14/2021   Procedure: HEMOSTASIS CLIP PLACEMENT;  Surgeon: Daneil Dolin, MD;  [facility];  Service: Endoscopy;;   IR PERC TUN PERIT CATH WO PORT S&I /IMAG  09/15/2020   IR REMOVAL TUN CV CATH W/O FL  02/19/2021   IR US GUIDE VASC ACCESS RIGHT  09/15/2020   POLYPECTOMY  04/24/2021   Procedure: POLYPECTOMY;  Surgeon: Eloise Harman, DO;  [facility];  Service: Endoscopy;;   POLYPECTOMY  05/14/2021   Procedure: POLYPECTOMY;  Surgeon: Daneil Dolin, MD;  [facility];  Service: Endoscopy;;   SKIN SPLIT GRAFT Bilateral 09/03/2021   Procedure: SKIN GRAFT BILATERAL LEGS;  Surgeon: Newt Minion, MD;  Location: [facility];  Service: Orthopedics;  Laterality: Bilateral;   SKIN SPLIT GRAFT Left 12/10/2021   Procedure: IRRIGATION AND DEBRIDEMENT LEFT CALF, APPLICATION SPLIT THICKNESS SKIN GRAFT;  Surgeon: Newt Minion, MD;  Location: [facility];  Service: Orthopedics;  Laterality: Left;   TOE SURGERY     Social History    Occupational History   Not on file  Tobacco Use   Smoking status: Never   Smokeless tobacco: Never  Vaping Use   Vaping Use: Never used  Substance and  Sexual Activity   Alcohol use: No   Drug use: No   Sexual activity: Yes    Birth control/protection: Condom

## 2022-10-05 NOTE — Telephone Encounter (Signed)
Per Dr Abbey Chatters ok to schedule pt in urgent spot

## 2022-10-05 NOTE — Telephone Encounter (Signed)
Okay ot use urgent spot

## 2022-10-19 ENCOUNTER — Institutional Professional Consult (permissible substitution): Payer: Medicare Other | Admitting: Pulmonary Disease

## 2022-10-20 ENCOUNTER — Ambulatory Visit: Payer: Medicare Other | Admitting: Internal Medicine

## 2022-11-02 ENCOUNTER — Ambulatory Visit: Payer: Medicare Other | Admitting: Family

## 2022-11-07 ENCOUNTER — Institutional Professional Consult (permissible substitution): Payer: Medicare Other | Admitting: Pulmonary Disease

## 2022-11-07 NOTE — Progress Notes (Deleted)
Synopsis: Referred in 10/2022 for pulmonary hypertension  Subjective:   PATIENT ID: Amy Moses GENDER: female DOB: Jun 23, 1971, MRN: [id number]   HPI  No chief complaint on file.   *** Record review: November 2023 Hospital discharge summary reviewed where the patient was seen in the context of a lower GI bleed felt to be due to proctitis.  Has ESRD, diabetes mellitus type 2, pulmonary hypertension and essential hypertension. 2023 cardiology records reviewed where she was seen by Dr. Skeet Latch.  The patient previously been hospitalized for bacteremia in the summer 2023 at [facility] and was noted to have a low LVEF and pulmonary hypertension.  Bacteremia is felt to be related to wounds, vascular and orthopedic surgery were involved.  Patient had a follow-up echocardiogram in September which showed an LVEF of 50 to 55%, PASP in the 60s.  Nuclear stress test low risk.  Past Medical History:  Diagnosis Date   Anemia    Blindness of right eye with low vision in contralateral eye    s/p victrectomy   Chronic diastolic heart failure ([place]) 03/30/2021   Diabetes mellitus, type II (HCC)    Dyslipidemia    Glaucoma    History of blood transfusion    Hypertension    Hypothyroidism (acquired)    Kidney disease    Stage 5   Pneumonia    Pulmonary hypertension, unspecified ([place]) 09/12/2022     Family History  Problem Relation Age of Onset   Heart failure Mother    Heart disease Mother    Diabetes Mother    Kidney disease Mother    Heart failure Father    Diabetes Father    Heart disease Father    Diabetes Brother    Heart failure Maternal Grandmother    Heart failure Maternal Grandfather    Transient ischemic attack Maternal Grandfather    Colon cancer Neg Hx      Social History   Socioeconomic History   Marital status: Single    Spouse name: Not on file   Number of children: Not on file   Years of education: Not on file   Highest education level: Not  on file  Occupational History   Not on file  Tobacco Use   Smoking status: Never   Smokeless tobacco: Never  Vaping Use   Vaping Use: Never used  Substance and Sexual Activity   Alcohol use: No   Drug use: No   Sexual activity: Yes    Birth control/protection: Condom  Other Topics Concern   Not on file  Social History Narrative   Not on file   Social Determinants of Health   Financial Resource Strain: Not on file  Food Insecurity: No Food Insecurity (10/02/2022)   Hunger Vital Sign    Worried About Running Out of Food in the Last Year: Never true    Ran Out of Food in the Last Year: Never true  Transportation Needs: No Transportation Needs (10/02/2022)   PRAPARE - Hydrologist (Medical): No    Lack of Transportation (Non-Medical): No  Physical Activity: Not on file  Stress: Not on file  Social Connections: Not on file  Intimate Partner Violence: Not At Risk (10/02/2022)   Humiliation, Afraid, Rape, and Kick questionnaire    Fear of Current or Ex-Partner: No    Emotionally Abused: No    Physically Abused: No    Sexually Abused: No     Allergies  Allergen Reactions  Ace Inhibitors Cough     Outpatient Medications Prior to Visit  Medication Sig Dispense Refill   acetaminophen (TYLENOL) 500 MG tablet Take 1,000 mg by mouth every 6 (six) hours as needed for moderate pain.     atorvastatin (LIPITOR) 10 MG tablet Take 1 tablet (10 mg total) by mouth daily. (Patient taking differently: Take 10 mg by mouth at bedtime.) 90 tablet 5   AURYXIA 1 GM 210 MG(Fe) tablet Take 630 mg by mouth 2 (two) times daily with a meal.     Blood Glucose Monitoring Suppl (ACCU-CHEK GUIDE ME) w/Device KIT 1 Piece by Does not apply route as directed. 1 kit 0   Blood Pressure Monitor KIT TAKE BLOOD PRESSURE DAILY 1 kit 0   Continuous Blood Gluc Receiver (FREESTYLE LIBRE 2 READER) DEVI As directed 1 each 0   Continuous Blood Gluc Sensor (FREESTYLE LIBRE 2 SENSOR) MISC  1 Piece by Does not apply route every 14 (fourteen) days. 2 each 3   glucose blood (ACCU-CHEK GUIDE) test strip Use as instructed 150 each 2   HUMALOG KWIKPEN 100 UNIT/ML KwikPen Inject 5-11 Units into the skin 3 (three) times daily with meals. If eats 50% or more of meal. 15 mL 1   hydrocortisone (ANUSOL-HC) 25 MG suppository Place 1 suppository (25 mg total) rectally 2 (two) times daily as needed for hemorrhoids or anal itching (pain.). 12 suppository 0   insulin glargine (LANTUS) 100 UNIT/ML injection Inject 0.2 mLs (20 Units total) into the skin at bedtime. (Patient taking differently: Inject 8 Units into the skin at bedtime.) 10 mL 1   levothyroxine (SYNTHROID) 75 MCG tablet Take 75 mcg by mouth daily.     midodrine (PROAMATINE) 5 MG tablet Take 5 mg by mouth See admin instructions. Take 1 hour prior to dialysis     pantoprazole (PROTONIX) 40 MG tablet Take 1 tablet (40 mg total) by mouth 2 (two) times daily. 60 tablet 5   polyethylene glycol (MIRALAX / GLYCOLAX) 17 g packet Take 17 g by mouth daily. 28 each 1   torsemide (DEMADEX) 100 MG tablet Take 100 mg by mouth daily.     Vitamin D, Ergocalciferol, (DRISDOL) 1.25 MG (50000 UNIT) CAPS capsule Take 50,000 Units by mouth every 7 (seven) days. Friday     No facility-administered medications prior to visit.    ROS    Objective:  Physical Exam   There were no vitals filed for this visit.  ***  CBC    Component Value Date/Time   WBC 5.2 10/02/2022 0451   RBC 3.35 (L) 10/02/2022 0451   HGB 10.1 (L) 10/02/2022 0451   HCT 31.9 (L) 10/02/2022 0451   PLT 105 (L) 10/02/2022 0451   MCV 95.2 10/02/2022 0451   MCH 30.1 10/02/2022 0451   MCHC 31.7 10/02/2022 0451   RDW 13.8 10/02/2022 0451   LYMPHSABS 0.9 04/29/2022 2240   MONOABS 0.4 04/29/2022 2240   EOSABS 0.1 04/29/2022 2240   BASOSABS 0.0 04/29/2022 2240     Chest imaging: June 2023 chest x-ray images independently reviewed showing significant cardiomegaly, parenchyma,  low lung volumes  PFT: October 2023 PFT ratio 100% FVC 1.97 L 51% predicted, lung volumes not reported, DLCO 11.37 50% predicted  Labs:  Path:  Echo: September 2023 echocardiogram (TTE) LVEF 50 to 55%, no LVH, PASP 64, RA pressure estimate ; moderate to severe MR, RA mild dilated, LA moderate dilated,   Heart Catheterization:       Assessment & Plan:  No diagnosis found.  Discussion: ***  Immunizations: Immunization History  Administered Date(s) Administered   Hepatitis B, adult 11/09/2020   Influenza-Unspecified 10/03/2020   Moderna Sars-Covid-2 Vaccination 02/28/2020, 10/22/2020   PPD Test 09/23/2020, 09/23/2020   Pneumococcal Polysaccharide-23 01/28/2019   Pneumococcal-Unspecified 05/30/2018   Tdap 06/04/2005, 10/21/2020     Current Outpatient Medications:    acetaminophen (TYLENOL) 500 MG tablet, Take 1,000 mg by mouth every 6 (six) hours as needed for moderate pain., Disp: , Rfl:    atorvastatin (LIPITOR) 10 MG tablet, Take 1 tablet (10 mg total) by mouth daily. (Patient taking differently: Take 10 mg by mouth at bedtime.), Disp: 90 tablet, Rfl: 5   AURYXIA 1 GM 210 MG(Fe) tablet, Take 630 mg by mouth 2 (two) times daily with a meal., Disp: , Rfl:    Blood Glucose Monitoring Suppl (ACCU-CHEK GUIDE ME) w/Device KIT, 1 Piece by Does not apply route as directed., Disp: 1 kit, Rfl: 0   Blood Pressure Monitor KIT, TAKE BLOOD PRESSURE DAILY, Disp: 1 kit, Rfl: 0   Continuous Blood Gluc Receiver (FREESTYLE LIBRE 2 READER) DEVI, As directed, Disp: 1 each, Rfl: 0   Continuous Blood Gluc Sensor (FREESTYLE LIBRE 2 SENSOR) MISC, 1 Piece by Does not apply route every 14 (fourteen) days., Disp: 2 each, Rfl: 3   glucose blood (ACCU-CHEK GUIDE) test strip, Use as instructed, Disp: 150 each, Rfl: 2   HUMALOG KWIKPEN 100 UNIT/ML KwikPen, Inject 5-11 Units into the skin 3 (three) times daily with meals. If eats 50% or more of meal., Disp: 15 mL, Rfl: 1   hydrocortisone (ANUSOL-HC) 25  MG suppository, Place 1 suppository (25 mg total) rectally 2 (two) times daily as needed for hemorrhoids or anal itching (pain.)., Disp: 12 suppository, Rfl: 0   insulin glargine (LANTUS) 100 UNIT/ML injection, Inject 0.2 mLs (20 Units total) into the skin at bedtime. (Patient taking differently: Inject 8 Units into the skin at bedtime.), Disp: 10 mL, Rfl: 1   levothyroxine (SYNTHROID) 75 MCG tablet, Take 75 mcg by mouth daily., Disp: , Rfl:    midodrine (PROAMATINE) 5 MG tablet, Take 5 mg by mouth See admin instructions. Take 1 hour prior to dialysis, Disp: , Rfl:    pantoprazole (PROTONIX) 40 MG tablet, Take 1 tablet (40 mg total) by mouth 2 (two) times daily., Disp: 60 tablet, Rfl: 5   polyethylene glycol (MIRALAX / GLYCOLAX) 17 g packet, Take 17 g by mouth daily., Disp: 28 each, Rfl: 1   torsemide (DEMADEX) 100 MG tablet, Take 100 mg by mouth daily., Disp: , Rfl:    Vitamin D, Ergocalciferol, (DRISDOL) 1.25 MG (50000 UNIT) CAPS capsule, Take 50,000 Units by mouth every 7 (seven) days. Friday, Disp: , Rfl:

## 2022-11-08 ENCOUNTER — Telehealth: Payer: Self-pay | Admitting: Orthopedic Surgery

## 2022-11-08 NOTE — Telephone Encounter (Signed)
Amy Moses From [name] called in stating that they are starting up care with the pt again... Amy Moses stated that she is following up on wound care... Amy Moses calling in to confirm wound care orders for pt... Amy Moses is requesting callback at [phone]

## 2022-11-08 NOTE — Telephone Encounter (Signed)
Pt has follow up appt on Friday. Will hold and update after pt evaluation.

## 2022-11-11 ENCOUNTER — Other Ambulatory Visit: Payer: Self-pay

## 2022-11-11 ENCOUNTER — Encounter: Payer: Self-pay | Admitting: Family

## 2022-11-11 ENCOUNTER — Ambulatory Visit (INDEPENDENT_AMBULATORY_CARE_PROVIDER_SITE_OTHER): Payer: Medicare Other | Admitting: Family

## 2022-11-11 DIAGNOSIS — L97923 Non-pressure chronic ulcer of unspecified part of left lower leg with necrosis of muscle: Secondary | ICD-10-CM

## 2022-11-11 DIAGNOSIS — I890 Lymphedema, not elsewhere classified: Secondary | ICD-10-CM | POA: Diagnosis not present

## 2022-11-11 DIAGNOSIS — L97911 Non-pressure chronic ulcer of unspecified part of right lower leg limited to breakdown of skin: Secondary | ICD-10-CM | POA: Diagnosis not present

## 2022-11-11 NOTE — Telephone Encounter (Signed)
Updated orders faxed to [phone]

## 2022-11-11 NOTE — Progress Notes (Unsigned)
Office Visit Note   Patient: Amy Moses           Date of Birth: 1971-11-17           MRN: [id number] Visit Date: 11/11/2022              Requested by: Emelda Fear, [place] [address] PCP: Emelda Fear, DO  Chief Complaint  Patient presents with   Left Leg - Follow-up   Right Leg - Follow-up      HPI: The patient is a 51 year old woman who is seen today in follow-up for bilateral lower extremity venous and lymphatic edema with calciphylaxis ulcerations to posterior calves.  She is also had ray amputation on the right fifth ray.  For the last 4 weeks she has been having home health assistance for Dynaflex compression wraps.  She is pleased with her progress.   Assessment & Plan: Visit Diagnoses: No diagnosis found.  Plan: Continued improvement.  Near full healing of ulcers on the right lower extremity.  Incision is well-healed today. we will continue with current therapy of once weekly wound cleansing with dial soap and water. Apply dynaflex compression wraps bilaterally. Discussed removing dressings and showering prior to [facility] dressing changes.  May weight-bear as tolerated, continue PT  Follow-Up Instructions: No follow-ups on file.   Ortho Exam  Patient is alert, oriented, no adenopathy, well-dressed, normal affect, normal respiratory effort.  Truncal edema present.  On examination of the left lower extremity there is stable edema from the area of the wrap distally with improvement in her skin reduced lichenification.  Has a posterior left lower extremity wound which is stable at 62cm x 2 cm. No depth. There is maceration. No active drainage. no erythema no warmth   To the right lower extremity.  Again control of the edema from the compression wrapping from the area of the wrap distally.  The calf ulcer which is 1 cm in diameter with surrounding thin friable skin. the ray amputation site ulcer is well-healed. no surrounding drainage erythema  warmth   Imaging: No results found. No images are attached to the encounter.  Labs: Lab Results  Component Value Date   HGBA1C 9.4 (H) 10/01/2022   HGBA1C 11.5 (H) 02/15/2022   HGBA1C 13.0 12/30/2021   ESRSEDRATE 34 (H) 02/14/2022   ESRSEDRATE 40 (H) 08/07/2021   CRP 7.0 (H) 02/14/2022   CRP 11.1 (H) 08/07/2021   REPTSTATUS 05/02/2022 FINAL 04/29/2022   GRAMSTAIN  07/08/2021    RARE WBC PRESENT,BOTH PMN AND MONONUCLEAR RARE GRAM POSITIVE RODS RARE GRAM POSITIVE COCCI IN PAIRS    CULT (A) 04/29/2022    CITROBACTER KOSERI SUSCEPTIBILITIES PERFORMED ON PREVIOUS CULTURE WITHIN THE LAST 5 DAYS. Performed at [facility], [place] [address] [place], [place] [address]    LABORGA CITROBACTER KOSERI 04/28/2022     Lab Results  Component Value Date   ALBUMIN 3.3 (L) 10/01/2022   ALBUMIN 3.9 09/30/2022   ALBUMIN 2.9 (L) 05/03/2022   PREALBUMIN 13.7 (L) 08/07/2021    Lab Results  Component Value Date   MG 2.4 10/01/2022   MG 2.1 04/30/2022   MG 2.1 05/14/2021   Lab Results  Component Value Date   VD25OH 36.9 12/30/2021    Lab Results  Component Value Date   PREALBUMIN 13.7 (L) 08/07/2021      Latest Ref Rng & Units 10/02/2022    4:51 AM 10/01/2022    4:56 AM 09/30/2022  7:49 PM  CBC EXTENDED  WBC 4.0 - 10.5 K/uL 5.2  5.2  6.5   RBC 3.87 - 5.11 MIL/uL 3.35  3.40  3.87   Hemoglobin 12.0 - 15.0 g/dL 10.1  10.2  11.7   HCT 36.0 - 46.0 % 31.9  32.1  36.6   Platelets 150 - 400 K/uL 105  111  142      There is no height or weight on file to calculate BMI.  Orders:  No orders of the defined types were placed in this encounter.  No orders of the defined types were placed in this encounter.    Procedures: No procedures performed  Clinical Data: No additional findings.  ROS:  All other systems negative, except as noted in the HPI. Review of Systems  Objective: Vital Signs: LMP 08/22/2015 (Approximate)   Specialty Comments:  No specialty  comments available.  PMFS History: Patient Active Problem List   Diagnosis Date Noted   Pressure injury of skin 10/01/2022   Proctitis 10/01/2022   Pulmonary hypertension, unspecified ([name]) 09/12/2022   GERD (gastroesophageal reflux disease) 04/30/2022   Bacteremia 04/29/2022   Non-adherence to medical treatment 03/16/2022   Cutaneous abscess of right foot    Osteomyelitis of foot ([place]) 02/14/2022   Obesity (BMI 30-39.9) 01/26/2022   Ischemic ulcer of right foot ([place]) 01/24/2022   Normocytic anemia 01/24/2022   Sacral pressure ulcer 32/44/0102   Metabolic acidosis, increased anion gap 01/24/2022   Leg wound, left, sequela 12/10/2021   Open leg wound 09/03/2021   Wound infection    Non-pressure chronic ulcer of right calf limited to breakdown of skin ([place])    Calciphylaxis of right lower extremity with nonhealing ulcer, limited to breakdown of skin ([place])    Chronic ulcer of left thigh ([place]) 08/07/2021   Calciphylaxis of left lower extremity with nonhealing ulcer with necrosis of muscle ([place]) 08/07/2021   Lower GI bleed 05/12/2021   Acute GI bleeding 04/24/2021   Acute blood loss anemia 04/23/2021   GI bleed 04/22/2021   Fever 03/31/2021   Chronic diastolic heart failure ([place]) 03/30/2021   ESRD (end stage renal disease) on dialysis ([place]) 11/16/2020   Calciphylaxis 11/06/2020   Non-healing open wound of heel 11/03/2020   Diabetic foot infection ([place]) 11/01/2020   Decubitus ulcer, heel 11/01/2020   Closed nondisplaced fracture of left patella 72/53/6644   Metabolic acidosis 03/47/4259   Acute on chronic renal failure ([place]) 06/10/2020   Anemia of chronic disease 06/10/2020   Acute pericardial effusion 06/10/2020   Chronic kidney disease, stage 4 (severe) ([place]) 03/05/2019   Vitamin D deficiency 01/28/2019   Type 2 diabetes mellitus with ESRD (end-stage renal disease) ([place]) 09/21/2015   Mixed hyperlipidemia 09/21/2015   Primary hypertension 09/21/2015   Acquired hypothyroidism  09/21/2015   Iris bomb 07/31/2012   Secondary angle-closure glaucoma 07/31/2012   Past Medical History:  Diagnosis Date   Anemia    Blindness of right eye with low vision in contralateral eye    s/p victrectomy   Chronic diastolic heart failure ([place]) 03/30/2021   Diabetes mellitus, type II ([place])    Dyslipidemia    Glaucoma    History of blood transfusion    Hypertension    Hypothyroidism (acquired)    Kidney disease    Stage 5   Pneumonia    Pulmonary hypertension, unspecified ([place]) 09/12/2022    Family History  Problem Relation Age of Onset   Heart failure Mother    Heart disease Mother  Diabetes Mother    Kidney disease Mother    Heart failure Father    Diabetes Father    Heart disease Father    Diabetes Brother    Heart failure Maternal Grandmother    Heart failure Maternal Grandfather    Transient ischemic attack Maternal Grandfather    Colon cancer Neg Hx     Past Surgical History:  Procedure Laterality Date   ABDOMINAL AORTOGRAM W/LOWER EXTREMITY Bilateral 12/18/2020   Procedure: ABDOMINAL AORTOGRAM W/LOWER EXTREMITY;  Surgeon: Elam Dutch, MD;  Location: [facility];  Service: Cardiovascular;  Laterality: Bilateral;   ABDOMINAL AORTOGRAM W/LOWER EXTREMITY Bilateral 01/25/2022   Procedure: ABDOMINAL AORTOGRAM W/LOWER EXTREMITY;  Surgeon: Serafina Mitchell, MD;  Location: [facility];  Service: Cardiovascular;  Laterality: Bilateral;   AMPUTATION Right 02/16/2022   Procedure: RIGHT FOOT 5TH RAY AMPUTATION;  Surgeon: Newt Minion, MD;  Location: [facility];  Service: Orthopedics;  Laterality: Right;   ANKLE FRACTURE SURGERY Right    AV FISTULA PLACEMENT Left 08/18/2020   Procedure: LEFT ARM BRACHIOCEPHALIC ARTERIOVENOUS (AV) FISTULA CREATION;  Surgeon: Elam Dutch, MD;  Location: [facility];  Service: Vascular;  Laterality: Left;   BIOPSY  04/24/2021   Procedure: BIOPSY;  Surgeon: Eloise Harman, DO;  [facility];  Service:  Endoscopy;;   CESAREAN SECTION     CHOLECYSTECTOMY     COLONOSCOPY  04/24/2021   Surgeon: Eloise Harman, DO;  nonbleeding internal hemorrhoids, 1 large (25 mm) pedunculated transverse colon polyp (prolapse type polyp) with adherent clot and stigmata of recent bleed.   COLONOSCOPY WITH PROPOFOL N/A 05/14/2021   Procedure: COLONOSCOPY WITH PROPOFOL;  Surgeon: Daneil Dolin, MD;  [facility];  Service: Endoscopy;  Laterality: N/A;   ESOPHAGOGASTRODUODENOSCOPY (EGD) WITH PROPOFOL N/A 04/24/2021   Surgeon: Eloise Harman, DO;  duodenal erosions and gastritis biopsied (pathology with peptic duodenitis, reactive gastropathy with erosions/chronic inflammation, negative for H. pylori)   EYE SURGERY     Vatrectomy   HEMOSTASIS CLIP PLACEMENT  05/14/2021   Procedure: HEMOSTASIS CLIP PLACEMENT;  Surgeon: Daneil Dolin, MD;  [facility];  Service: Endoscopy;;   IR PERC TUN PERIT CATH WO PORT S&I /IMAG  09/15/2020   IR REMOVAL TUN CV CATH W/O FL  02/19/2021   IR US GUIDE VASC ACCESS RIGHT  09/15/2020   POLYPECTOMY  04/24/2021   Procedure: POLYPECTOMY;  Surgeon: Eloise Harman, DO;  [facility];  Service: Endoscopy;;   POLYPECTOMY  05/14/2021   Procedure: POLYPECTOMY;  Surgeon: Daneil Dolin, MD;  [facility];  Service: Endoscopy;;   SKIN SPLIT GRAFT Bilateral 09/03/2021   Procedure: SKIN GRAFT BILATERAL LEGS;  Surgeon: Newt Minion, MD;  Location: [facility];  Service: Orthopedics;  Laterality: Bilateral;   SKIN SPLIT GRAFT Left 12/10/2021   Procedure: IRRIGATION AND DEBRIDEMENT LEFT CALF, APPLICATION SPLIT THICKNESS SKIN GRAFT;  Surgeon: Newt Minion, MD;  Location: [facility];  Service: Orthopedics;  Laterality: Left;   TOE SURGERY     Social History   Occupational History   Not on file  Tobacco Use   Smoking status: Never   Smokeless tobacco: Never  Vaping Use   Vaping Use: Never used  Substance and Sexual Activity   Alcohol use: No    Drug use: No   Sexual activity: Yes    Birth control/protection: Condom

## 2022-11-16 ENCOUNTER — Ambulatory Visit (INDEPENDENT_AMBULATORY_CARE_PROVIDER_SITE_OTHER): Payer: Medicare Other | Admitting: Cardiovascular Disease

## 2022-11-16 ENCOUNTER — Encounter (HOSPITAL_BASED_OUTPATIENT_CLINIC_OR_DEPARTMENT_OTHER): Payer: Self-pay | Admitting: Cardiovascular Disease

## 2022-11-16 VITALS — BP 136/78 | HR 78 | Ht 66.0 in | Wt 248.4 lb

## 2022-11-16 DIAGNOSIS — I272 Pulmonary hypertension, unspecified: Secondary | ICD-10-CM

## 2022-11-16 DIAGNOSIS — I340 Nonrheumatic mitral (valve) insufficiency: Secondary | ICD-10-CM | POA: Insufficient documentation

## 2022-11-16 DIAGNOSIS — K922 Gastrointestinal hemorrhage, unspecified: Secondary | ICD-10-CM | POA: Diagnosis not present

## 2022-11-16 DIAGNOSIS — I5032 Chronic diastolic (congestive) heart failure: Secondary | ICD-10-CM

## 2022-11-16 DIAGNOSIS — E782 Mixed hyperlipidemia: Secondary | ICD-10-CM

## 2022-11-16 HISTORY — DX: Nonrheumatic mitral (valve) insufficiency: I34.0

## 2022-11-16 NOTE — Assessment & Plan Note (Addendum)
Systolic function was reduced but has improved to 50-55% on her echo 07/2022.  Volume is managed with HD.  She has no heart failure symptoms at this time. BP is too low for heart failure therapies.

## 2022-11-16 NOTE — Assessment & Plan Note (Signed)
Continue atorvastatin

## 2022-11-16 NOTE — Progress Notes (Signed)
Cardiology Office Note:    Date:  11/16/2022   ID:  Amy Moses, DOB 07/03/1971, MRN [id number]  PCP:  Emelda Fear, DO   [place] Providers Cardiologist:  None     Referring MD: Emelda Fear, DO   No chief complaint on file.   History of Present Illness:    Amy Moses is a 51 y.o. female with a hx of diabetes, hypertension, hyperlipidemia, ESRD on HD, right eye blindness, and hypothyroidism. She was admitted to [facility] 6/23 with bacteremia. Plans were made for 4 weeks of antibiotics. She had non-healing lower extremity wounds and is followed by vascular and orthopedics. She had an Echo that revealed LVEF 45-50% with global hypokinesis and grade 2 diastolic dysfunction. PASP was 54 mm Hg. They recommended cardiology follow-up.  She was seen 06/2022 and was feeling well.  She did note some orthopnea she laid on her back for prolonged periods of time.  She had a repeat echo 07/2022 that revealed LVEF 50 to 55% and indeterminate diastolic function.  PASP was 64 mmHg.  RA pressure 8.  She had a nuclear stress test that revealed LVEF 60% and a fixed defect in the mid to basal inferior and inferolateral regions.  Wall motion was normal and the area was thought to be due to diaphragmatic activity.  Overall was low risk.  At the last visit she reported episodes of hypotension, with systolic pressures into the 90s, during her dialysis treatments over the last x1.5 months. She feels hot when her BP is lower. Typically staff stops the treatment for 10-15 minutes and her BP improves. She is always able to complete her treatments afterward. Midodrine was ordered prior to her dialysis sessions. She has severe pulmonary hypertension on her echo. Autoimmune labs were all normal. PFTs were abnormal and she has been referred to pulmonary.  She was admitted to [facility] 09/2022 with GI bleed. CT showed thickening of the rectum concerning for mild proctitis. She was  started on a bowel regimen and they recommended outpatient follow-up with possible colonoscopy.  Today, she is doing well overall. She is tolerating her midodrine.  Nephrology increased the dose and this is helping.  She had some low BP on her non-HD days and has now been taking the midodrine daily. Her dialysis is more stable. Her breathing is okay. At home, her BP readings average 110/70s.  She denies chest pain, shortness of breath, palpitations, bleeding,  lower extremity edema, orthopnea or PND. We discussed her echo, which showed high lung pressure. She will see her pulmonologist in January.   Past Medical History:  Diagnosis Date   Anemia    Blindness of right eye with low vision in contralateral eye    s/p victrectomy   Chronic diastolic heart failure ([place]) 03/30/2021   Diabetes mellitus, type II ([place])    Dyslipidemia    Glaucoma    History of blood transfusion    Hypertension    Hypothyroidism (acquired)    Kidney disease    Stage 5   Mitral regurgitation 11/16/2022   Pneumonia    Pulmonary hypertension, unspecified ([place]) 09/12/2022    Past Surgical History:  Procedure Laterality Date   ABDOMINAL AORTOGRAM W/LOWER EXTREMITY Bilateral 12/18/2020   Procedure: ABDOMINAL AORTOGRAM W/LOWER EXTREMITY;  Surgeon: Elam Dutch, MD;  Location: [facility];  Service: Cardiovascular;  Laterality: Bilateral;   ABDOMINAL AORTOGRAM W/LOWER EXTREMITY Bilateral 01/25/2022   Procedure: ABDOMINAL AORTOGRAM W/LOWER EXTREMITY;  Surgeon: Harold Barban  W, MD;  Location: [facility];  Service: Cardiovascular;  Laterality: Bilateral;   AMPUTATION Right 02/16/2022   Procedure: RIGHT FOOT 5TH RAY AMPUTATION;  Surgeon: Newt Minion, MD;  Location: [place];  Service: Orthopedics;  Laterality: Right;   ANKLE FRACTURE SURGERY Right    AV FISTULA PLACEMENT Left 08/18/2020   Procedure: LEFT ARM BRACHIOCEPHALIC ARTERIOVENOUS (AV) FISTULA CREATION;  Surgeon: Elam Dutch, MD;  Location:  [place];  Service: Vascular;  Laterality: Left;   BIOPSY  04/24/2021   Procedure: BIOPSY;  Surgeon: Eloise Harman, DO;  [facility];  Service: Endoscopy;;   CESAREAN SECTION     CHOLECYSTECTOMY     COLONOSCOPY  04/24/2021   Surgeon: Eloise Harman, DO;  nonbleeding internal hemorrhoids, 1 large (25 mm) pedunculated transverse colon polyp (prolapse type polyp) with adherent clot and stigmata of recent bleed.   COLONOSCOPY WITH PROPOFOL N/A 05/14/2021   Procedure: COLONOSCOPY WITH PROPOFOL;  Surgeon: Daneil Dolin, MD;  [facility];  Service: Endoscopy;  Laterality: N/A;   ESOPHAGOGASTRODUODENOSCOPY (EGD) WITH PROPOFOL N/A 04/24/2021   Surgeon: Eloise Harman, DO;  duodenal erosions and gastritis biopsied (pathology with peptic duodenitis, reactive gastropathy with erosions/chronic inflammation, negative for H. pylori)   EYE SURGERY     Vatrectomy   HEMOSTASIS CLIP PLACEMENT  05/14/2021   Procedure: HEMOSTASIS CLIP PLACEMENT;  Surgeon: Daneil Dolin, MD;  [facility];  Service: Endoscopy;;   IR PERC TUN PERIT CATH WO PORT S&I /IMAG  09/15/2020   IR REMOVAL TUN CV CATH W/O FL  02/19/2021   IR US GUIDE VASC ACCESS RIGHT  09/15/2020   POLYPECTOMY  04/24/2021   Procedure: POLYPECTOMY;  Surgeon: Eloise Harman, DO;  [facility];  Service: Endoscopy;;   POLYPECTOMY  05/14/2021   Procedure: POLYPECTOMY;  Surgeon: Daneil Dolin, MD;  [facility];  Service: Endoscopy;;   SKIN SPLIT GRAFT Bilateral 09/03/2021   Procedure: SKIN GRAFT BILATERAL LEGS;  Surgeon: Newt Minion, MD;  Location: [place];  Service: Orthopedics;  Laterality: Bilateral;   SKIN SPLIT GRAFT Left 12/10/2021   Procedure: IRRIGATION AND DEBRIDEMENT LEFT CALF, APPLICATION SPLIT THICKNESS SKIN GRAFT;  Surgeon: Newt Minion, MD;  Location: [place];  Service: Orthopedics;  Laterality: Left;   TOE SURGERY      Current Medications: Current Meds  Medication Sig    acetaminophen (TYLENOL) 500 MG tablet Take 1,000 mg by mouth every 6 (six) hours as needed for moderate pain.   atorvastatin (LIPITOR) 10 MG tablet Take 1 tablet (10 mg total) by mouth daily. (Patient taking differently: Take 10 mg by mouth at bedtime.)   AURYXIA 1 GM 210 MG(Fe) tablet Take 630 mg by mouth 2 (two) times daily with a meal.   Blood Glucose Monitoring Suppl (ACCU-CHEK GUIDE ME) w/Device KIT 1 Piece by Does not apply route as directed.   Blood Pressure Monitor KIT TAKE BLOOD PRESSURE DAILY   Continuous Blood Gluc Receiver (FREESTYLE LIBRE 2 READER) DEVI As directed   Continuous Blood Gluc Sensor (FREESTYLE LIBRE 2 SENSOR) MISC 1 Piece by Does not apply route every 14 (fourteen) days.   glucose blood (ACCU-CHEK GUIDE) test strip Use as instructed   HUMALOG KWIKPEN 100 UNIT/ML KwikPen Inject 5-11 Units into the skin 3 (three) times daily with meals. If eats 50% or more of meal.   insulin glargine (LANTUS) 100 UNIT/ML injection Inject 0.2 mLs (20 Units total) into  the skin at bedtime. (Patient taking differently: Inject 8 Units into the skin at bedtime.)   levothyroxine (SYNTHROID) 75 MCG tablet Take 75 mcg by mouth daily.   midodrine (PROAMATINE) 5 MG tablet Take 10 mg by mouth See admin instructions. Take 1 hour prior to dialysis   pantoprazole (PROTONIX) 40 MG tablet Take 1 tablet (40 mg total) by mouth 2 (two) times daily.   polyethylene glycol (MIRALAX / GLYCOLAX) 17 g packet Take 17 g by mouth daily.   torsemide (DEMADEX) 100 MG tablet Take 100 mg by mouth daily.   Vitamin D, Ergocalciferol, (DRISDOL) 1.25 MG (50000 UNIT) CAPS capsule Take 50,000 Units by mouth every 7 (seven) days. Friday     Allergies:   Ace inhibitors   Social History   Socioeconomic History   Marital status: Single    Spouse name: Not on file   Number of children: Not on file   Years of education: Not on file   Highest education level: Not on file  Occupational History   Not on file  Tobacco Use    Smoking status: Never   Smokeless tobacco: Never  Vaping Use   Vaping Use: Never used  Substance and Sexual Activity   Alcohol use: No   Drug use: No   Sexual activity: Yes    Birth control/protection: Condom  Other Topics Concern   Not on file  Social History Narrative   Not on file   Social Determinants of Health   Financial Resource Strain: Not on file  Food Insecurity: No Food Insecurity (10/02/2022)   Hunger Vital Sign    Worried About Running Out of Food in the Last Year: Never true    Ran Out of Food in the Last Year: Never true  Transportation Needs: No Transportation Needs (10/02/2022)   PRAPARE - Hydrologist (Medical): No    Lack of Transportation (Non-Medical): No  Physical Activity: Not on file  Stress: Not on file  Social Connections: Not on file     Family History: The patient's family history includes Diabetes in her brother, father, and mother; Heart disease in her father and mother; Heart failure in her father, maternal grandfather, maternal grandmother, and mother; Kidney disease in her mother; Transient ischemic attack in her maternal grandfather. There is no history of Colon cancer.  ROS:   Please see the history of present illness. (+) tinnitus (+) dizziness All other systems reviewed and are negative.  EKGs/Labs/Other Studies Reviewed:    The following studies were reviewed today:  VAS US DUPLEX 09/09/2022: Summary:  Patent left brachio cephalic AFV.  Tortuousity in the distal outflow vein.  Velocities <100 cm/s in the mid and proximal outflow vein.   Lexiscan Myoview  07/2022:   Findings are consistent with no prior ischemia. The study is low risk.   No ST deviation was noted.   LV perfusion is abnormal. Defect 1: There is a large defect with moderate reduction in uptake present in the mid to basal inferior and inferolateral location(s) that is fixed. There is normal wall motion in the defect area. Consistent  with artifact caused by subdiaphragmatic activity.   Left ventricular function is normal. Nuclear stress EF: 60 %. The left ventricular ejection fraction is normal (55-65%). End diastolic cavity size is mildly enlarged. End systolic cavity size is normal.   Prior study not available for comparison.   Large size, moderate intensity fixed basal to mid inferior, inferolateral and apical perfusion defect with  subdiaphragmatic attenuation artifact. No reversible ischemia. LVEF 60% with normal wall motion. Mild RV uptake may suggest pulmonary hypertension. This is a low risk study. No prior study for comparison.\   Echo 07/27/22: IMPRESSIONS     1. Left ventricular ejection fraction, by estimation, is 50 to 55%. The  left ventricle has low normal function. The left ventricle has no regional  wall motion abnormalities. The left ventricular internal cavity size was  mildly dilated. Left ventricular  diastolic parameters are indeterminate. Elevated left ventricular  end-diastolic pressure.   2. Right ventricular systolic function is mildly reduced. The right  ventricular size is moderately enlarged. There is severely elevated  pulmonary artery systolic pressure. The estimated right ventricular  systolic pressure is 37.1 mmHg.   3. Left atrial size was moderately dilated.   4. Right atrial size was mildly dilated.   5. The mitral valve is degenerative. Moderate to severe mitral valve  regurgitation. No evidence of mitral stenosis.   6. Tricuspid valve regurgitation is severe.   7. The aortic valve is calcified. There is moderate calcification of the  aortic valve. There is severe thickening of the aortic valve. Aortic valve  regurgitation is not visualized. Aortic valve sclerosis/calcification is  present, without any evidence of   aortic stenosis.   8. The inferior vena cava is dilated in size with >50% respiratory  variability, suggesting right atrial pressure of 8 mmHg.   9. Small to moderate  circumferential pericardial effusion with no Rv  diastolic collapse or RA inversion. Respirophasic changes in MV inflow  velocities was < 25%.   Echocardiogram  05/02/2022  1. Left ventricular ejection fraction, by estimation, is 45 to 50%. Left  ventricular ejection fraction by PLAX is 46 %. The left ventricle has  mildly decreased function. The left ventricle demonstrates global  hypokinesis. Left ventricular diastolic  parameters are consistent with Grade II diastolic dysfunction  (pseudonormalization). Elevated left ventricular end-diastolic pressure.   2. Right ventricular systolic function is mildly reduced. The right  ventricular size is normal. There is moderately elevated pulmonary artery  systolic pressure. The estimated right ventricular systolic pressure is  06.2 mmHg.   3. Left atrial size was severely dilated.   4. Right atrial size was mildly dilated.   5. The pericardial effusion is circumferential. There is no evidence of  cardiac tamponade.   6. The mitral valve is abnormal. Mild mitral valve regurgitation.   7. Tricuspid valve regurgitation is mild to moderate.   8. The aortic valve is tricuspid. Aortic valve regurgitation is not  visualized. Aortic valve sclerosis/calcification is present, without any  evidence of aortic stenosis. Aortic valve mean gradient measures 5.0 mmHg.   9. The inferior vena cava is dilated in size with >50% respiratory  variability, suggesting right atrial pressure of 8 mmHg.   Comparison(s): Changes from prior study are noted. 04/01/2021: LVEF 55-60%,  grade 2 DD.   Abdominal Aortogram 01/25/2022: Findings:             Aortogram: No obvious renal stenosis identified.  The infrarenal abdominal aorta is widely patent.  Bilateral common and external iliac arteries are widely patent.            Right Lower Extremity: Right common femoral profundofemoral and superficial femoral artery are widely patent.  The popliteal artery is widely patent.   There is two-vessel runoff via the peroneal and posterior tibial artery.  There is diffuse disease of the small vessels in the foot  Left Lower Extremity: Left common femoral profundofemoral and superficial femoral artery are widely patent.  The popliteal artery is widely patent.  The peroneal artery is the dominant runoff.  There is reconstitution of the posterior tibial from peroneal collaterals at the ankle.  Anterior tibial artery appears to become diminutive in the lower leg.  Impression:            #1  No evidence of inflow or outflow stenosis            #2  On the right, there is two-vessel runoff via the peroneal and posterior tibial artery without any lesions amenable to intervention            #3  Dominant runoff on the left is the peroneal artery  Echo 04/01/2021:  1. Left ventricular ejection fraction, by estimation, is 55 to 60%. The  left ventricle has normal function. The left ventricle has no regional  wall motion abnormalities. Left ventricular diastolic parameters are  consistent with Grade II diastolic  dysfunction (pseudonormalization).   2. Right ventricular systolic function is normal. The right ventricular  size is normal.   3. Left atrial size was mildly dilated.   4. A small pericardial effusion is present.   5. The mitral valve is normal in structure. No evidence of mitral valve  regurgitation.   6. Tricuspid valve regurgitation is mild to moderate.   7. The aortic valve is normal in structure. Aortic valve regurgitation is  not visualized.    EKG: EKG is personally reviewed.  11/16/22: EKG was not ordered. 06/24/22: EKG was not ordered.  05/09/22: Sinus rhythm Borderline right axis deviation Nonspecific T abnormalities, lateral leads, No significant change since last tracing, 82 bpm   Recent Labs: 01/24/2022: TSH 3.109 10/01/2022: ALT 10; BUN 41; Creatinine, Ser 5.80; Magnesium 2.4; Potassium 3.8; Sodium 135 10/02/2022: Hemoglobin 10.1; Platelets 105    Recent Lipid Panel    Component Value Date/Time   CHOL 85 01/26/2022 0555   TRIG 98 01/26/2022 0555   HDL 25 (L) 01/26/2022 0555   CHOLHDL 3.4 01/26/2022 0555   VLDL 20 01/26/2022 0555   LDLCALC 40 01/26/2022 0555     Physical Exam:    VS:  BP 136/78 (BP Location: Left Arm, Patient Position: Sitting, Cuff Size: Normal)   Pulse 78   Ht _0  (1.676 m)   Wt 248 lb 6.4 oz (112.7 kg)   LMP 08/22/2015 (Approximate)   SpO2 97%   BMI 40.09 kg/m     Wt Readings from Last 3 Encounters:  11/16/22 248 lb 6.4 oz (112.7 kg)  10/01/22 207 lb 14.3 oz (94.3 kg)  09/12/22 210 lb (95.3 kg)    VS:  BP 136/78 (BP Location: Left Arm, Patient Position: Sitting, Cuff Size: Normal)   Pulse 78   Ht _1  (1.676 m)   Wt 248 lb 6.4 oz (112.7 kg)   LMP 08/22/2015 (Approximate)   SpO2 97%   BMI 40.09 kg/m  , BMI Body mass index is 40.09 kg/m. GENERAL:  Well appearing; In wheelchair HEENT: Pupils equal round and reactive, fundi not visualized, oral mucosa unremarkable NECK:  No jugular venous distention, waveform within normal limits, carotid upstroke brisk and symmetric, no bruits, no thyromegaly LUNGS:  Clear to auscultation bilaterally HEART:  RRR.  PMI not displaced or sustained,S1 and S2 within normal limits, no S3, no S4, no clicks, no rubs, IV/VI systolic murmur throughout ABD:  Flat, positive bowel sounds normal in frequency in pitch,  no bruits, no rebound, no guarding, no midline pulsatile mass, no hepatomegaly, no splenomegaly EXT:  Unable to assess due to leg wrappings in place SKIN:  No rashes no nodules NEURO:  Cranial nerves II through XII grossly intact, motor grossly intact throughout PSYCH:  Cognitively intact, oriented to person place and time   ASSESSMENT:    1. Chronic diastolic heart failure (HCC)   2. Pulmonary hypertension, unspecified ([place])   3. Nonrheumatic mitral valve regurgitation   4. Lower GI bleed   5. Mixed hyperlipidemia      PLAN:    In order of  problems listed above:  Chronic diastolic heart failure ([place]) Systolic function was reduced but has improved to 50-55% on her echo 07/2022.  Volume is managed with HD.  She has no heart failure symptoms at this time. BP is too low for heart failure therapies.   Pulmonary hypertension, unspecified ([place]) Pulmonary pressures are elevated.  She has no autoimmune abnormalities.  V/Q was negative.  PFTs are abnormal.  I suspect her pulmonary pressure is elevated due to mitral regurgitation and diastolic dysfunction.  However, given her abnormal PFTs, will also have Dr. Lake Bells weigh in.  She was not able to properly stand for PFTs. Unclear whether this affected her results.    Mitral regurgitation Moderate MR. Images personally reviewed today.  She has a small, somewhat restricted posterior leaflet which is likely the cause of her regurgitation.  Continue to monitor.  She is not a good candidate for surgery and her anatomy would be challenging for Mitra-Clip.  Lower GI bleed Resolved.   Mixed hyperlipidemia Continue atorvastatin.  Follow up: in 6 months  Medication Adjustments/Labs and Tests Ordered: Current medicines are reviewed at length with the patient today.  Concerns regarding medicines are outlined above.  No orders of the defined types were placed in this encounter.  No orders of the defined types were placed in this encounter.  Patient Instructions  Medication Instructions:  Continue current medications  *If you need a refill on your cardiac medications before your next appointment, please call your pharmacy*   Lab Work: None Ordered   Testing/Procedures: None Ordered   Follow-Up: At [facility], you and your health needs are our priority.  As part of our continuing mission to provide you with exceptional heart care, we have created designated Provider Care Teams.  These Care Teams include your primary Cardiologist (physician) and Advanced Practice Providers  (APPs -  Physician Assistants and Nurse Practitioners) who all work together to provide you with the care you need, when you need it.  We recommend signing up for the patient portal called "MyChart".  Sign up information is provided on this After Visit Summary.  MyChart is used to connect with patients for Virtual Visits (Telemedicine).  Patients are able to view lab/test results, encounter notes, upcoming appointments, etc.  Non-urgent messages can be sent to your provider as well.   To learn more about what you can do with MyChart, go to [email or web address].    Your next appointment:   6 month(s)  The format for your next appointment:   In Person  Provider:   Skeet Latch, MD   Other Instructions             I,Mitra Faeizi,acting as a scribe for Skeet Latch, MD.,have documented all relevant documentation on the behalf of Skeet Latch, MD,as directed by  Skeet Latch, MD while in the presence of Skeet Latch, MD.  I, Felton Oval Linsey,  MD have reviewed all documentation for this visit.  The documentation of the exam, diagnosis, procedures, and orders on 11/16/2022 are all accurate and complete.  Signed, Skeet Latch, MD  11/16/2022 10:52 AM    [place]

## 2022-11-16 NOTE — Assessment & Plan Note (Addendum)
Moderate MR. Images personally reviewed today.  She has a small, somewhat restricted posterior leaflet which is likely the cause of her regurgitation.  Continue to monitor.  She is not a good candidate for surgery and her anatomy would be challenging for Mitra-Clip.

## 2022-11-16 NOTE — Patient Instructions (Signed)
Medication Instructions:  Continue current medications  *If you need a refill on your cardiac medications before your next appointment, please call your pharmacy*   Lab Work: None Ordered   Testing/Procedures: None Ordered   Follow-Up: At [facility], you and your health needs are our priority.  As part of our continuing mission to provide you with exceptional heart care, we have created designated Provider Care Teams.  These Care Teams include your primary Cardiologist (physician) and Advanced Practice Providers (APPs -  Physician Assistants and Nurse Practitioners) who all work together to provide you with the care you need, when you need it.  We recommend signing up for the patient portal called "MyChart".  Sign up information is provided on this After Visit Summary.  MyChart is used to connect with patients for Virtual Visits (Telemedicine).  Patients are able to view lab/test results, encounter notes, upcoming appointments, etc.  Non-urgent messages can be sent to your provider as well.   To learn more about what you can do with MyChart, go to [email or web address].    Your next appointment:   6 month(s)  The format for your next appointment:   In Person  Provider:   Tiffany Brooks, MD    Other Instructions          

## 2022-11-16 NOTE — Assessment & Plan Note (Signed)
Resolved

## 2022-11-16 NOTE — Assessment & Plan Note (Signed)
Pulmonary pressures are elevated.  She has no autoimmune abnormalities.  V/Q was negative.  PFTs are abnormal.  I suspect her pulmonary pressure is elevated due to mitral regurgitation and diastolic dysfunction.  However, given her abnormal PFTs, will also have Dr. Lake Bells weigh in.  She was not able to properly stand for PFTs. Unclear whether this affected her results.

## 2022-12-15 NOTE — Progress Notes (Signed)
Synopsis: Referred in January 2024 for pulmonary hypertension  Subjective:   PATIENT ID: Amy Moses GENDER: female DOB: 1971-03-19, MRN: [id number]   HPI  Chief Complaint  Patient presents with   Consult    Pt states she was referred by cardiologist but does not know why    She has never been told taht she had a lung problem.  No problems with dyspnea now.  She stays sedentary due to severe knee pain.  She does no walking whatsoever, not even when she goes to the bathroom.  It's been this way since she fell and fractured her patella in 2021.  She says that her calcifilaxis is severe and has prevented any attempts at surgical correction.  She doesn't feel dyspnea when she drives around.  No chest pain.  She coughs: > sometimes produces phlegm which she attributes to sinus drainage. > typically allergies makes it worse > she'll take OTC medicine from time to time  Never been tested for sleep apnea: > never been told that she quits breathing > family says no apnea > she is sleepy frequently in the mornings, feels tired and goes back to sleep  She's never had blood clots   She has had thenar wasting for a number of years, never told why.  No history of alcohol abuse.  Never smoker.  She has had chronic severe leg swelling for years, currently has both legs wrapped.  She has attributed this to her history of end-stage renal disease.  She has been on dialysis for 2 years as noted above she has calciphylaxis.  She has hypothyroidism, has been on synthroid for years, 7-8.   She's never taken weight loss.  Never smoked.   Record review: Hospital discharge summary from November 2023 reviewed where the patient was seen in the context of a GI bleed.  She has a past medical history significant for end-stage renal disease, calciphylaxis, chronic diastolic heart failure, diabetes mellitus.  Past Medical History:  Diagnosis Date   Anemia    Blindness of right eye with low  vision in contralateral eye    s/p victrectomy   Chronic diastolic heart failure ([place]) 03/30/2021   Diabetes mellitus, type II (HCC)    Dyslipidemia    Glaucoma    History of blood transfusion    Hypertension    Hypothyroidism (acquired)    Kidney disease    Stage 5   Mitral regurgitation 11/16/2022   Pneumonia    Pulmonary hypertension, unspecified ([place]) 09/12/2022     Family History  Problem Relation Age of Onset   Heart failure Mother    Heart disease Mother    Diabetes Mother    Kidney disease Mother    Heart failure Father    Diabetes Father    Heart disease Father    Diabetes Brother    Heart failure Maternal Grandmother    Heart failure Maternal Grandfather    Transient ischemic attack Maternal Grandfather    Colon cancer Neg Hx      Social History   Socioeconomic History   Marital status: Single    Spouse name: Not on file   Number of children: Not on file   Years of education: Not on file   Highest education level: Not on file  Occupational History   Not on file  Tobacco Use   Smoking status: Never   Smokeless tobacco: Never  Vaping Use   Vaping Use: Never used  Substance and Sexual Activity   Alcohol  use: No   Drug use: No   Sexual activity: Yes    Birth control/protection: Condom  Other Topics Concern   Not on file  Social History Narrative   Not on file   Social Determinants of Health   Financial Resource Strain: Not on file  Food Insecurity: No Food Insecurity (10/02/2022)   Hunger Vital Sign    Worried About Running Out of Food in the Last Year: Never true    Ran Out of Food in the Last Year: Never true  Transportation Needs: No Transportation Needs (10/02/2022)   PRAPARE - Hydrologist (Medical): No    Lack of Transportation (Non-Medical): No  Physical Activity: Not on file  Stress: Not on file  Social Connections: Not on file  Intimate Partner Violence: Not At Risk (10/02/2022)   Humiliation, Afraid,  Rape, and Kick questionnaire    Fear of Current or Ex-Partner: No    Emotionally Abused: No    Physically Abused: No    Sexually Abused: No     Allergies  Allergen Reactions   Ace Inhibitors Cough     Outpatient Medications Prior to Visit  Medication Sig Dispense Refill   acetaminophen (TYLENOL) 500 MG tablet Take 1,000 mg by mouth every 6 (six) hours as needed for moderate pain.     atorvastatin (LIPITOR) 10 MG tablet Take 1 tablet (10 mg total) by mouth daily. (Patient taking differently: Take 10 mg by mouth at bedtime.) 90 tablet 5   AURYXIA 1 GM 210 MG(Fe) tablet Take 630 mg by mouth 2 (two) times daily with a meal.     Blood Glucose Monitoring Suppl (ACCU-CHEK GUIDE ME) w/Device KIT 1 Piece by Does not apply route as directed. 1 kit 0   Blood Pressure Monitor KIT TAKE BLOOD PRESSURE DAILY 1 kit 0   Continuous Blood Gluc Receiver (FREESTYLE LIBRE 2 READER) DEVI As directed 1 each 0   Continuous Blood Gluc Sensor (FREESTYLE LIBRE 2 SENSOR) MISC 1 Piece by Does not apply route every 14 (fourteen) days. 2 each 3   glucose blood (ACCU-CHEK GUIDE) test strip Use as instructed 150 each 2   HUMALOG KWIKPEN 100 UNIT/ML KwikPen Inject 5-11 Units into the skin 3 (three) times daily with meals. If eats 50% or more of meal. 15 mL 1   hydrocortisone (ANUSOL-HC) 25 MG suppository Place 1 suppository (25 mg total) rectally 2 (two) times daily as needed for hemorrhoids or anal itching (pain.). 12 suppository 0   insulin glargine (LANTUS) 100 UNIT/ML injection Inject 0.2 mLs (20 Units total) into the skin at bedtime. (Patient taking differently: Inject 8 Units into the skin at bedtime.) 10 mL 1   levothyroxine (SYNTHROID) 75 MCG tablet Take 75 mcg by mouth daily.     midodrine (PROAMATINE) 5 MG tablet Take 10 mg by mouth See admin instructions. Take 1 hour prior to dialysis     pantoprazole (PROTONIX) 40 MG tablet Take 1 tablet (40 mg total) by mouth 2 (two) times daily. 60 tablet 5   polyethylene  glycol (MIRALAX / GLYCOLAX) 17 g packet Take 17 g by mouth daily. 28 each 1   torsemide (DEMADEX) 100 MG tablet Take 100 mg by mouth daily.     Vitamin D, Ergocalciferol, (DRISDOL) 1.25 MG (50000 UNIT) CAPS capsule Take 50,000 Units by mouth every 7 (seven) days. Friday     No facility-administered medications prior to visit.    Review of Systems  Constitutional:  Positive for  malaise/fatigue. Negative for chills, fever and weight loss.  HENT:  Positive for congestion. Negative for nosebleeds, sinus pain and sore throat.   Eyes:  Negative for photophobia, pain and discharge.  Respiratory:  Positive for cough. Negative for hemoptysis, sputum production, shortness of breath and wheezing.   Cardiovascular:  Positive for leg swelling. Negative for chest pain, palpitations and orthopnea.  Gastrointestinal:  Negative for abdominal pain, constipation, diarrhea, nausea and vomiting.  Genitourinary:  Negative for dysuria, frequency, hematuria and urgency.  Musculoskeletal:  Negative for back pain, joint pain, myalgias and neck pain.  Skin:  Negative for itching and rash.  Neurological:  Negative for tingling, tremors, sensory change, speech change, focal weakness, seizures, weakness and headaches.  Psychiatric/Behavioral:  Negative for memory loss, substance abuse and suicidal ideas. The patient is not nervous/anxious.       Objective:  Physical Exam   Vitals:   12/16/22 1051 12/16/22 1054  BP: (!) 89/60 (!) 89/60  Pulse:  72  Temp:  98 F (36.7 C)  TempSrc:  Oral  SpO2:  100%  Weight:  210 lb (95.3 kg)  Height:  5\' 6"  (1.676 m)    Gen: chronically ill appearing, no acute distress HENT: NCAT, OP clear, neck supple without masses Eyes: PERRL, EOMi Lymph: no cervical lymphadenopathy PULM: CTA B CV: RRR, no mgr, no JVD GI: BS+, soft, nontender, no hsm Derm: severe edema in legs bilaterally, no rash or skin breakdown MSK: significant thenar wasting Neuro: A&Ox4, in wheelchair,  could not assess full neuro exam Psyche: normal mood and affect   CBC    Component Value Date/Time   WBC 5.2 10/02/2022 0451   RBC 3.35 (L) 10/02/2022 0451   HGB 10.1 (L) 10/02/2022 0451   HCT 31.9 (L) 10/02/2022 0451   PLT 105 (L) 10/02/2022 0451   MCV 95.2 10/02/2022 0451   MCH 30.1 10/02/2022 0451   MCHC 31.7 10/02/2022 0451   RDW 13.8 10/02/2022 0451   LYMPHSABS 0.9 04/29/2022 2240   MONOABS 0.4 04/29/2022 2240   EOSABS 0.1 04/29/2022 2240   BASOSABS 0.0 04/29/2022 2240     Chest imaging: September 2023 chest x-ray significant cardiomegaly, normal pulmonary parenchyma November 2023 pulmonary perfusion scan normal  PFT: October 2023 PFT: Ratio normal, FVC 1.97L 51% predicted, DLCO 11.37, 50% predicted  Labs:  Path:  Echo: September 2023 transthoracic echocardiogram LVEF 55%, left atrium dilated, RV function mildly reduced, RV size moderately enlarged, RVSP 64 mmHg, tricuspid regurgitant jet velocity 3.75, severe TR  Heart Catheterization:       Assessment & Plan:   Primary pulmonary hypertension ([place]) - Plan: Pulmonary function test, Home sleep test  Daytime somnolence  Chronic systolic heart failure (HCC)  ESRD (end stage renal disease) (HCC)  Discussion: Amy Moses presents with pulmonary hypertension in the context of multiple underlying heart and kidney abnormalities.  However, most recently her systolic heart failure has improved which is reassuring.  I worry about the possibility of underlying sleep apnea contributing to her pulmonary hypertension.  We talked in general today about what pulmonary hypertension means and how heart and lung conditions can contribute to this.  We talked about how she may need to have a right heart cath to assess further.  Given the fact that she snores I think that testing for sleep apnea is the best neck step.  She has daytime somnolence to go along with this so there is a very high pretest probability of sleep apnea.  I  also think  she needs to have lung function testing to look for evidence of restrictive lung disease as she has a relatively unexplained thenar wasting in her hands so I worry about respiratory muscle weakness.  She has been tested recently for HIV, she has known thyroid disease but as HIV testing was negative and she is on Synthroid I doubt these are contributing to her pulmonary hypertension.  Plan: Pulmonary hypertension: Lung function test Sleep study At some point we may need to get a right heart catheterization depending on the results of these tests  Daytime somnolence, heavy snoring: Home sleep study  We will see you back in 4 to 6 weeks or sooner to go over the results of these test  Immunizations: Immunization History  Administered Date(s) Administered   Hepatitis B, adult 11/09/2020   Influenza-Unspecified 10/03/2020   Moderna Sars-Covid-2 Vaccination 02/28/2020, 10/22/2020   PPD Test 09/23/2020, 09/23/2020   Pneumococcal Polysaccharide-23 01/28/2019   Pneumococcal-Unspecified 05/30/2018   Tdap 06/04/2005, 10/21/2020     Current Outpatient Medications:    acetaminophen (TYLENOL) 500 MG tablet, Take 1,000 mg by mouth every 6 (six) hours as needed for moderate pain., Disp: , Rfl:    atorvastatin (LIPITOR) 10 MG tablet, Take 1 tablet (10 mg total) by mouth daily. (Patient taking differently: Take 10 mg by mouth at bedtime.), Disp: 90 tablet, Rfl: 5   AURYXIA 1 GM 210 MG(Fe) tablet, Take 630 mg by mouth 2 (two) times daily with a meal., Disp: , Rfl:    Blood Glucose Monitoring Suppl (ACCU-CHEK GUIDE ME) w/Device KIT, 1 Piece by Does not apply route as directed., Disp: 1 kit, Rfl: 0   Blood Pressure Monitor KIT, TAKE BLOOD PRESSURE DAILY, Disp: 1 kit, Rfl: 0   Continuous Blood Gluc Receiver (FREESTYLE LIBRE 2 READER) DEVI, As directed, Disp: 1 each, Rfl: 0   Continuous Blood Gluc Sensor (FREESTYLE LIBRE 2 SENSOR) MISC, 1 Piece by Does not apply route every 14 (fourteen) days.,  Disp: 2 each, Rfl: 3   glucose blood (ACCU-CHEK GUIDE) test strip, Use as instructed, Disp: 150 each, Rfl: 2   HUMALOG KWIKPEN 100 UNIT/ML KwikPen, Inject 5-11 Units into the skin 3 (three) times daily with meals. If eats 50% or more of meal., Disp: 15 mL, Rfl: 1   hydrocortisone (ANUSOL-HC) 25 MG suppository, Place 1 suppository (25 mg total) rectally 2 (two) times daily as needed for hemorrhoids or anal itching (pain.)., Disp: 12 suppository, Rfl: 0   insulin glargine (LANTUS) 100 UNIT/ML injection, Inject 0.2 mLs (20 Units total) into the skin at bedtime. (Patient taking differently: Inject 8 Units into the skin at bedtime.), Disp: 10 mL, Rfl: 1   levothyroxine (SYNTHROID) 75 MCG tablet, Take 75 mcg by mouth daily., Disp: , Rfl:    midodrine (PROAMATINE) 5 MG tablet, Take 10 mg by mouth See admin instructions. Take 1 hour prior to dialysis, Disp: , Rfl:    pantoprazole (PROTONIX) 40 MG tablet, Take 1 tablet (40 mg total) by mouth 2 (two) times daily., Disp: 60 tablet, Rfl: 5   polyethylene glycol (MIRALAX / GLYCOLAX) 17 g packet, Take 17 g by mouth daily., Disp: 28 each, Rfl: 1   torsemide (DEMADEX) 100 MG tablet, Take 100 mg by mouth daily., Disp: , Rfl:    Vitamin D, Ergocalciferol, (DRISDOL) 1.25 MG (50000 UNIT) CAPS capsule, Take 50,000 Units by mouth every 7 (seven) days. Friday, Disp: , Rfl:

## 2022-12-16 ENCOUNTER — Encounter: Payer: Self-pay | Admitting: Pulmonary Disease

## 2022-12-16 ENCOUNTER — Ambulatory Visit (INDEPENDENT_AMBULATORY_CARE_PROVIDER_SITE_OTHER): Payer: Medicare Other | Admitting: Pulmonary Disease

## 2022-12-16 VITALS — BP 89/60 | HR 72 | Temp 98.0°F | Ht 66.0 in | Wt 210.0 lb

## 2022-12-16 DIAGNOSIS — I270 Primary pulmonary hypertension: Secondary | ICD-10-CM

## 2022-12-16 DIAGNOSIS — N186 End stage renal disease: Secondary | ICD-10-CM

## 2022-12-16 DIAGNOSIS — R400 Somnolence: Secondary | ICD-10-CM

## 2022-12-16 DIAGNOSIS — I5022 Chronic systolic (congestive) heart failure: Secondary | ICD-10-CM | POA: Diagnosis not present

## 2022-12-16 NOTE — Patient Instructions (Signed)
Pulmonary hypertension: Lung function test Sleep study At some point we may need to get a right heart catheterization depending on the results of these tests  Daytime somnolence, heavy snoring: Home sleep study  We will see you back in 4 to 6 weeks or sooner to go over the results of these test

## 2023-01-16 ENCOUNTER — Ambulatory Visit: Payer: Medicare Other | Admitting: Orthopedic Surgery

## 2023-01-25 ENCOUNTER — Telehealth: Payer: Self-pay | Admitting: Family

## 2023-01-25 NOTE — Telephone Encounter (Signed)
Patient called stating she was referred by [name] to Dr. Lake Bells at [facility].  She got a call from them today letting her know that Dr. Lake Bells is leaving the practice.  She wants to know who else in that practice we would recommend.

## 2023-01-25 NOTE — Telephone Encounter (Signed)
Spoke with patient and she would like to see provider at the [place] location, closer to her since she drives from [place]  Will ask that pulmonary reach out to patient

## 2023-01-30 ENCOUNTER — Ambulatory Visit: Payer: Medicare Other | Admitting: Pulmonary Disease

## 2023-01-30 ENCOUNTER — Encounter: Payer: Self-pay | Admitting: Orthopedic Surgery

## 2023-01-30 ENCOUNTER — Ambulatory Visit (INDEPENDENT_AMBULATORY_CARE_PROVIDER_SITE_OTHER): Payer: Medicare Other | Admitting: Orthopedic Surgery

## 2023-01-30 ENCOUNTER — Ambulatory Visit (INDEPENDENT_AMBULATORY_CARE_PROVIDER_SITE_OTHER): Payer: Medicare Other

## 2023-01-30 DIAGNOSIS — M25562 Pain in left knee: Secondary | ICD-10-CM

## 2023-01-30 DIAGNOSIS — L97911 Non-pressure chronic ulcer of unspecified part of right lower leg limited to breakdown of skin: Secondary | ICD-10-CM | POA: Diagnosis not present

## 2023-01-30 DIAGNOSIS — L97923 Non-pressure chronic ulcer of unspecified part of left lower leg with necrosis of muscle: Secondary | ICD-10-CM

## 2023-01-30 NOTE — Progress Notes (Signed)
Office Visit Note   Patient: Amy Moses           Date of Birth: [birth year]           MRN: [id number] Visit Date: 01/30/2023              Requested by: Emelda Fear, [place] [address] PCP: Emelda Fear, DO  Chief Complaint  Patient presents with   Right Leg - Follow-up   Left Leg - Follow-up      HPI: Patient is a 52 year old woman who presents for multiple medical issues.  She is currently been undergoing compression wraps at home secondary to the calciphylaxis and ulcers both lower extremities.  Patient states she has not been using her lymphedema pumps.  Patient also has had partial patellar resection with a fixed flexion contracture of the left knee and inquires regarding surgical intervention to correct her knee.  Assessment & Plan: Visit Diagnoses:  1. Acute pain of left knee   2. Calciphylaxis of right lower extremity with nonhealing ulcer, limited to breakdown of skin ([place])   3. Calciphylaxis of left lower extremity with nonhealing ulcer with necrosis of muscle ([place])     Plan: Recommended continue home health dressing changes weekly for compression of both lower extremities.  With the fixed flexion contracture, peripheral vascular disease, retraction of the quad tendon do not feel there is any surgical intervention to improve her knee function.  Recommended physical therapy for progressive ambulation weightbearing as tolerated on the left.  Patient is provided a prescription for physical therapy to obtain physical therapy near her home in [place].  Follow-Up Instructions: Return in about 2 months (around 04/01/2023).   Ortho Exam  Patient is alert, oriented, no adenopathy, well-dressed, normal affect, normal respiratory effort. Examination patient has no plantar ulcers no heel ulcers her feet are resolved.  Examination of her legs the calciphylaxis swelling and ulceration has also resolved.  Examination of the left knee patient has a  fixed flexion contracture and lacks 45 degrees of extension both actively and passively.  Actively she has flexion from 45 degrees to 90 degrees.  Her knee is stable.  Imaging: XR Knee 1-2 Views Left  Result Date: 01/30/2023 2 view radiographs of the left knee shows osteopenia with major resection of the patella and quad tendon contracture.  Patient has severe peripheral vascular disease with calcification of the popliteal vessels.  No images are attached to the encounter.  Labs: Lab Results  Component Value Date   HGBA1C 9.4 (H) 10/01/2022   HGBA1C 11.5 (H) 02/15/2022   HGBA1C 13.0 12/30/2021   ESRSEDRATE 34 (H) 02/14/2022   ESRSEDRATE 40 (H) 08/07/2021   CRP 7.0 (H) 02/14/2022   CRP 11.1 (H) 08/07/2021   REPTSTATUS 05/02/2022 FINAL 04/29/2022   GRAMSTAIN  07/08/2021    RARE WBC PRESENT,BOTH PMN AND MONONUCLEAR RARE GRAM POSITIVE RODS RARE GRAM POSITIVE COCCI IN PAIRS    CULT (A) 04/29/2022    CITROBACTER KOSERI SUSCEPTIBILITIES PERFORMED ON PREVIOUS CULTURE WITHIN THE LAST 5 DAYS. Performed at [facility], [place] [address] [place], [place] [address]    LABORGA CITROBACTER KOSERI 04/28/2022     Lab Results  Component Value Date   ALBUMIN 3.3 (L) 10/01/2022   ALBUMIN 3.9 09/30/2022   ALBUMIN 2.9 (L) 05/03/2022   PREALBUMIN 13.7 (L) 08/07/2021    Lab Results  Component Value Date   MG 2.4 10/01/2022   MG 2.1 04/30/2022   MG 2.1  05/14/2021   Lab Results  Component Value Date   VD25OH 36.9 12/30/2021    Lab Results  Component Value Date   PREALBUMIN 13.7 (L) 08/07/2021      Latest Ref Rng & Units 10/02/2022    4:51 AM 10/01/2022    4:56 AM 09/30/2022    7:49 PM  CBC EXTENDED  WBC 4.0 - 10.5 K/uL 5.2  5.2  6.5   RBC 3.87 - 5.11 MIL/uL 3.35  3.40  3.87   Hemoglobin 12.0 - 15.0 g/dL 10.1  10.2  11.7   HCT 36.0 - 46.0 % 31.9  32.1  36.6   Platelets 150 - 400 K/uL 105  111  142      There is no height or weight on file to calculate BMI.  Orders:   Orders Placed This Encounter  Procedures   XR Knee 1-2 Views Left   No orders of the defined types were placed in this encounter.    Procedures: No procedures performed  Clinical Data: No additional findings.  ROS:  All other systems negative, except as noted in the HPI. Review of Systems  Objective: Vital Signs: LMP 08/22/2015 (Approximate)   Specialty Comments:  No specialty comments available.  PMFS History: Patient Active Problem List   Diagnosis Date Noted   Mitral regurgitation 11/16/2022   Pressure injury of skin 10/01/2022   Proctitis 10/01/2022   Pulmonary hypertension, unspecified ([place]) 09/12/2022   GERD (gastroesophageal reflux disease) 04/30/2022   Bacteremia 04/29/2022   Non-adherence to medical treatment 03/16/2022   Cutaneous abscess of right foot    Osteomyelitis of foot ([place]) 02/14/2022   Obesity (BMI 30-39.9) 01/26/2022   Ischemic ulcer of right foot ([place]) 01/24/2022   Normocytic anemia 01/24/2022   Sacral pressure ulcer [id number]   Metabolic acidosis, increased anion gap 01/24/2022   Leg wound, left, sequela 12/10/2021   Open leg wound 09/03/2021   Wound infection    Non-pressure chronic ulcer of right calf limited to breakdown of skin ([place])    Calciphylaxis of right lower extremity with nonhealing ulcer, limited to breakdown of skin ([place])    Chronic ulcer of left thigh ([place]) 08/07/2021   Calciphylaxis of left lower extremity with nonhealing ulcer with necrosis of muscle ([place]) 08/07/2021   Lower GI bleed 05/12/2021   Acute GI bleeding 04/24/2021   Acute blood loss anemia 04/23/2021   GI bleed 04/22/2021   Fever 03/31/2021   Chronic diastolic heart failure ([place]) 03/30/2021   ESRD (end stage renal disease) on dialysis ([place]) 11/16/2020   Calciphylaxis 11/06/2020   Non-healing open wound of heel 11/03/2020   Diabetic foot infection ([place]) 11/01/2020   Decubitus ulcer, heel 11/01/2020   Closed nondisplaced fracture of left patella [id number]    Metabolic acidosis [id number]   Acute on chronic renal failure ([place]) 06/10/2020   Anemia of chronic disease 06/10/2020   Acute pericardial effusion 06/10/2020   Chronic kidney disease, stage 4 (severe) ([place]) 03/05/2019   Vitamin D deficiency 01/28/2019   Type 2 diabetes mellitus with ESRD (end-stage renal disease) ([place]) 09/21/2015   Mixed hyperlipidemia 09/21/2015   Primary hypertension 09/21/2015   Acquired hypothyroidism 09/21/2015   Iris bomb 07/31/2012   Secondary angle-closure glaucoma 07/31/2012   Past Medical History:  Diagnosis Date   Anemia    Blindness of right eye with low vision in contralateral eye    s/p victrectomy   Chronic diastolic heart failure ([place]) 03/30/2021   Diabetes mellitus, type II ([place])    Dyslipidemia  Glaucoma    History of blood transfusion    Hypertension    Hypothyroidism (acquired)    Kidney disease    Stage 5   Mitral regurgitation 11/16/2022   Pneumonia    Pulmonary hypertension, unspecified ([place]) 09/12/2022    Family History  Problem Relation Age of Onset   Heart failure Mother    Heart disease Mother    Diabetes Mother    Kidney disease Mother    Heart failure Father    Diabetes Father    Heart disease Father    Diabetes Brother    Heart failure Maternal Grandmother    Heart failure Maternal Grandfather    Transient ischemic attack Maternal Grandfather    Colon cancer Neg Hx     Past Surgical History:  Procedure Laterality Date   ABDOMINAL AORTOGRAM W/LOWER EXTREMITY Bilateral 12/18/2020   Procedure: ABDOMINAL AORTOGRAM W/LOWER EXTREMITY;  Surgeon: Elam Dutch, MD;  Location: [facility];  Service: Cardiovascular;  Laterality: Bilateral;   ABDOMINAL AORTOGRAM W/LOWER EXTREMITY Bilateral 01/25/2022   Procedure: ABDOMINAL AORTOGRAM W/LOWER EXTREMITY;  Surgeon: Serafina Mitchell, MD;  Location: [facility];  Service: Cardiovascular;  Laterality: Bilateral;   AMPUTATION Right 02/16/2022   Procedure: RIGHT  FOOT 5TH RAY AMPUTATION;  Surgeon: Newt Minion, MD;  Location: [facility];  Service: Orthopedics;  Laterality: Right;   ANKLE FRACTURE SURGERY Right    AV FISTULA PLACEMENT Left 08/18/2020   Procedure: LEFT ARM BRACHIOCEPHALIC ARTERIOVENOUS (AV) FISTULA CREATION;  Surgeon: Elam Dutch, MD;  Location: [facility];  Service: Vascular;  Laterality: Left;   BIOPSY  04/24/2021   Procedure: BIOPSY;  Surgeon: Eloise Harman, DO;  [facility];  Service: Endoscopy;;   CESAREAN SECTION     CHOLECYSTECTOMY     COLONOSCOPY  04/24/2021   Surgeon: Eloise Harman, DO;  nonbleeding internal hemorrhoids, 1 large (25 mm) pedunculated transverse colon polyp (prolapse type polyp) with adherent clot and stigmata of recent bleed.   COLONOSCOPY WITH PROPOFOL N/A 05/14/2021   Procedure: COLONOSCOPY WITH PROPOFOL;  Surgeon: Daneil Dolin, MD;  [facility];  Service: Endoscopy;  Laterality: N/A;   ESOPHAGOGASTRODUODENOSCOPY (EGD) WITH PROPOFOL N/A 04/24/2021   Surgeon: Eloise Harman, DO;  duodenal erosions and gastritis biopsied (pathology with peptic duodenitis, reactive gastropathy with erosions/chronic inflammation, negative for H. pylori)   EYE SURGERY     Vatrectomy   HEMOSTASIS CLIP PLACEMENT  05/14/2021   Procedure: HEMOSTASIS CLIP PLACEMENT;  Surgeon: Daneil Dolin, MD;  [facility];  Service: Endoscopy;;   IR PERC TUN PERIT CATH WO PORT S&I /IMAG  09/15/2020   IR REMOVAL TUN CV CATH W/O FL  02/19/2021   IR US GUIDE VASC ACCESS RIGHT  09/15/2020   POLYPECTOMY  04/24/2021   Procedure: POLYPECTOMY;  Surgeon: Eloise Harman, DO;  [facility];  Service: Endoscopy;;   POLYPECTOMY  05/14/2021   Procedure: POLYPECTOMY;  Surgeon: Daneil Dolin, MD;  [facility];  Service: Endoscopy;;   SKIN SPLIT GRAFT Bilateral 09/03/2021   Procedure: SKIN GRAFT BILATERAL LEGS;  Surgeon: Newt Minion, MD;  Location: [facility];  Service: Orthopedics;   Laterality: Bilateral;   SKIN SPLIT GRAFT Left 12/10/2021   Procedure: IRRIGATION AND DEBRIDEMENT LEFT CALF, APPLICATION SPLIT THICKNESS SKIN GRAFT;  Surgeon: Newt Minion, MD;  Location: [facility];  Service: Orthopedics;  Laterality: Left;   TOE SURGERY     Social History  Occupational History   Not on file  Tobacco Use   Smoking status: Never   Smokeless tobacco: Never  Vaping Use   Vaping Use: Never used  Substance and Sexual Activity   Alcohol use: No   Drug use: No   Sexual activity: Yes    Birth control/protection: Condom

## 2023-02-16 ENCOUNTER — Ambulatory Visit (HOSPITAL_BASED_OUTPATIENT_CLINIC_OR_DEPARTMENT_OTHER): Payer: Medicare Other | Admitting: Pulmonary Disease

## 2023-02-22 ENCOUNTER — Telehealth: Payer: Self-pay | Admitting: Orthopedic Surgery

## 2023-02-22 NOTE — Telephone Encounter (Signed)
[facility] needing to speak to a tech about patients P/T and wound care. Cecille Rubin) [phone]

## 2023-02-23 ENCOUNTER — Telehealth: Payer: Self-pay | Admitting: Orthopedic Surgery

## 2023-02-23 NOTE — Telephone Encounter (Signed)
Duplicate message will sign off on this and return call

## 2023-02-23 NOTE — Telephone Encounter (Signed)
Called and sw Amy Moses  Recommended physical therapy for progressive ambulation weightbearing as tolerated on the left.  And continue with current wound care orders.  Also will call back with wound measurements of hp wound so that I can submit an order through [employer] for a new wheelchair cushion. Will hold message pending return call with documentation to justify the need for a cushion

## 2023-02-23 NOTE — Telephone Encounter (Signed)
Please call [facility] --[phone], they need an order for In home P/T

## 2023-03-01 NOTE — Telephone Encounter (Signed)
Wheelchair cushion orders with skin protectant through [employer].

## 2023-03-08 ENCOUNTER — Ambulatory Visit (INDEPENDENT_AMBULATORY_CARE_PROVIDER_SITE_OTHER): Payer: Medicare Other | Admitting: Adult Health

## 2023-03-08 DIAGNOSIS — G4733 Obstructive sleep apnea (adult) (pediatric): Secondary | ICD-10-CM | POA: Diagnosis not present

## 2023-03-08 DIAGNOSIS — I270 Primary pulmonary hypertension: Secondary | ICD-10-CM

## 2023-03-14 NOTE — Progress Notes (Signed)
Called and spoke with patient, provided results/recommendations per Rubye Oaks NP.  She verbalized understanding.  Scheduled video visit for 5/6.  Nothing further needed.

## 2023-03-15 ENCOUNTER — Encounter (HOSPITAL_COMMUNITY): Payer: Self-pay | Admitting: *Deleted

## 2023-03-15 ENCOUNTER — Other Ambulatory Visit: Payer: Self-pay

## 2023-03-15 ENCOUNTER — Observation Stay (HOSPITAL_COMMUNITY)
Admission: EM | Admit: 2023-03-15 | Discharge: 2023-03-17 | Disposition: A | Discharge status: Home / Self Care | Payer: Medicare Other | Attending: Family Medicine | Admitting: Family Medicine

## 2023-03-15 ENCOUNTER — Telehealth: Payer: Self-pay | Admitting: Internal Medicine

## 2023-03-15 DIAGNOSIS — K625 Hemorrhage of anus and rectum: Secondary | ICD-10-CM | POA: Diagnosis not present

## 2023-03-15 DIAGNOSIS — Z794 Long term (current) use of insulin: Secondary | ICD-10-CM | POA: Diagnosis not present

## 2023-03-15 DIAGNOSIS — N186 End stage renal disease: Secondary | ICD-10-CM | POA: Diagnosis not present

## 2023-03-15 DIAGNOSIS — K921 Melena: Secondary | ICD-10-CM | POA: Insufficient documentation

## 2023-03-15 DIAGNOSIS — E1122 Type 2 diabetes mellitus with diabetic chronic kidney disease: Secondary | ICD-10-CM | POA: Diagnosis not present

## 2023-03-15 DIAGNOSIS — I132 Hypertensive heart and chronic kidney disease with heart failure and with stage 5 chronic kidney disease, or end stage renal disease: Secondary | ICD-10-CM | POA: Diagnosis not present

## 2023-03-15 DIAGNOSIS — E039 Hypothyroidism, unspecified: Secondary | ICD-10-CM | POA: Diagnosis present

## 2023-03-15 DIAGNOSIS — D631 Anemia in chronic kidney disease: Secondary | ICD-10-CM | POA: Diagnosis not present

## 2023-03-15 DIAGNOSIS — D696 Thrombocytopenia, unspecified: Secondary | ICD-10-CM | POA: Diagnosis not present

## 2023-03-15 DIAGNOSIS — Z992 Dependence on renal dialysis: Secondary | ICD-10-CM | POA: Diagnosis not present

## 2023-03-15 DIAGNOSIS — Z79899 Other long term (current) drug therapy: Secondary | ICD-10-CM | POA: Diagnosis not present

## 2023-03-15 DIAGNOSIS — I5032 Chronic diastolic (congestive) heart failure: Secondary | ICD-10-CM | POA: Diagnosis not present

## 2023-03-15 DIAGNOSIS — I340 Nonrheumatic mitral (valve) insufficiency: Secondary | ICD-10-CM | POA: Diagnosis present

## 2023-03-15 DIAGNOSIS — K649 Unspecified hemorrhoids: Secondary | ICD-10-CM | POA: Insufficient documentation

## 2023-03-15 DIAGNOSIS — D122 Benign neoplasm of ascending colon: Principal | ICD-10-CM | POA: Insufficient documentation

## 2023-03-15 DIAGNOSIS — K922 Gastrointestinal hemorrhage, unspecified: Secondary | ICD-10-CM | POA: Diagnosis not present

## 2023-03-15 DIAGNOSIS — I959 Hypotension, unspecified: Secondary | ICD-10-CM | POA: Insufficient documentation

## 2023-03-15 DIAGNOSIS — K219 Gastro-esophageal reflux disease without esophagitis: Secondary | ICD-10-CM | POA: Diagnosis present

## 2023-03-15 LAB — TYPE AND SCREEN
ABO/RH(D): O POS
Antibody Screen: NEGATIVE

## 2023-03-15 LAB — COMPREHENSIVE METABOLIC PANEL
ALT: 16 U/L (ref 0–44)
AST: 15 U/L (ref 15–41)
Albumin: 4 g/dL (ref 3.5–5.0)
Alkaline Phosphatase: 144 U/L — ABNORMAL HIGH (ref 38–126)
Anion gap: 11 (ref 5–15)
BUN: 48 mg/dL — ABNORMAL HIGH (ref 6–20)
CO2: 23 mmol/L (ref 22–32)
Calcium: 8.4 mg/dL — ABNORMAL LOW (ref 8.9–10.3)
Chloride: 99 mmol/L (ref 98–111)
Creatinine, Ser: 7.13 mg/dL — ABNORMAL HIGH (ref 0.44–1.00)
GFR, Estimated: 7 mL/min — ABNORMAL LOW (ref >60)
Glucose, Bld: 187 mg/dL — ABNORMAL HIGH (ref 70–99)
Potassium: 4.8 mmol/L (ref 3.5–5.1)
Sodium: 133 mmol/L — ABNORMAL LOW (ref 135–145)
Total Bilirubin: 0.7 mg/dL (ref 0.3–1.2)
Total Protein: 7.1 g/dL (ref 6.5–8.1)

## 2023-03-15 LAB — CBC WITH DIFFERENTIAL/PLATELET
Abs Immature Granulocytes: 0.02 10*3/uL (ref 0.00–0.07)
Basophils Absolute: 0 10*3/uL (ref 0.0–0.1)
Basophils Relative: 1 %
Eosinophils Absolute: 0.2 10*3/uL (ref 0.0–0.5)
Eosinophils Relative: 3 %
HCT: 37.6 % (ref 36.0–46.0)
Hemoglobin: 11.6 g/dL — ABNORMAL LOW (ref 12.0–15.0)
Immature Granulocytes: 0 %
Lymphocytes Relative: 14 %
Lymphs Abs: 1 10*3/uL (ref 0.7–4.0)
MCH: 29.6 pg (ref 26.0–34.0)
MCHC: 30.9 g/dL (ref 30.0–36.0)
MCV: 95.9 fL (ref 80.0–100.0)
Monocytes Absolute: 0.6 10*3/uL (ref 0.1–1.0)
Monocytes Relative: 8 %
Neutro Abs: 5.1 10*3/uL (ref 1.7–7.7)
Neutrophils Relative %: 74 %
Platelets: 103 10*3/uL — ABNORMAL LOW (ref 150–400)
RBC: 3.92 MIL/uL (ref 3.87–5.11)
RDW: 14.8 % (ref 11.5–15.5)
WBC: 7 10*3/uL (ref 4.0–10.5)
nRBC: 0 % (ref 0.0–0.2)

## 2023-03-15 LAB — GLUCOSE, CAPILLARY: Glucose-Capillary: 174 mg/dL — ABNORMAL HIGH (ref 70–99)

## 2023-03-15 LAB — PROTIME-INR
INR: 1.1 (ref 0.8–1.2)
Prothrombin Time: 14.2 seconds (ref 11.4–15.2)

## 2023-03-15 MED ORDER — PANTOPRAZOLE SODIUM 40 MG PO TBEC
40.0000 mg | DELAYED_RELEASE_TABLET | Freq: Two times a day (BID) | ORAL | Status: DC
  Administered 2023-03-15 – 2023-03-17 (×4): 40 mg via ORAL
  Filled 2023-03-15 (×4): qty 1

## 2023-03-15 MED ORDER — ACETAMINOPHEN 650 MG RE SUPP: 650.0000 mg | Freq: Four times a day (QID) | RECTAL | Status: DC | PRN

## 2023-03-15 MED ORDER — MIDODRINE HCL 5 MG PO TABS
10.0000 mg | ORAL_TABLET | Freq: Every day | ORAL | Status: DC
  Administered 2023-03-16: 10 mg via ORAL
  Filled 2023-03-15: qty 2

## 2023-03-15 MED ORDER — INSULIN ASPART 100 UNIT/ML IJ SOLN: 0.0000 [IU] | Freq: Every day | INTRAMUSCULAR | Status: DC

## 2023-03-15 MED ORDER — ATORVASTATIN CALCIUM 10 MG PO TABS
10.0000 mg | ORAL_TABLET | Freq: Every day | ORAL | Status: DC
  Administered 2023-03-16 – 2023-03-17 (×2): 10 mg via ORAL
  Filled 2023-03-15 (×2): qty 1

## 2023-03-15 MED ORDER — ACETAMINOPHEN 325 MG PO TABS
650.0000 mg | ORAL_TABLET | Freq: Four times a day (QID) | ORAL | Status: DC | PRN
  Administered 2023-03-17: 650 mg via ORAL
  Filled 2023-03-15: qty 2

## 2023-03-15 MED ORDER — FERRIC CITRATE 1 GM 210 MG(FE) PO TABS
630.0000 mg | ORAL_TABLET | Freq: Two times a day (BID) | ORAL | Status: DC
  Filled 2023-03-15 (×9): qty 3

## 2023-03-15 MED ORDER — INSULIN ASPART 100 UNIT/ML IJ SOLN: 0.0000 [IU] | Freq: Three times a day (TID) | INTRAMUSCULAR | Status: DC

## 2023-03-15 MED ORDER — POLYETHYLENE GLYCOL 3350 17 G PO PACK
17.0000 g | PACK | Freq: Every day | ORAL | Status: DC
  Administered 2023-03-16 – 2023-03-17 (×2): 17 g via ORAL
  Filled 2023-03-15 (×2): qty 1

## 2023-03-15 MED ORDER — LEVOTHYROXINE SODIUM 75 MCG PO TABS
75.0000 ug | ORAL_TABLET | Freq: Every day | ORAL | Status: DC
  Administered 2023-03-16: 75 ug via ORAL
  Filled 2023-03-15: qty 1

## 2023-03-15 MED ORDER — HYDRALAZINE HCL 20 MG/ML IJ SOLN: 5.0000 mg | INTRAMUSCULAR | Status: DC | PRN

## 2023-03-15 MED ORDER — INSULIN GLARGINE-YFGN 100 UNIT/ML ~~LOC~~ SOLN
5.0000 [IU] | Freq: Every day | SUBCUTANEOUS | Status: DC
  Administered 2023-03-15 – 2023-03-16 (×2): 5 [IU] via SUBCUTANEOUS
  Filled 2023-03-15 (×3): qty 0.05

## 2023-03-15 NOTE — Discharge Instructions (Addendum)
We evaluated you for your rectal bleeding.  Your rectal bleeding is most likely from an external hemorrhoid.  Your testing was reassuring including your hemoglobin.  Please call Dr. Marletta Lor to schedule a follow-up appointment.  If you have persistent bleeding, lightheadedness or dizziness, fainting, chest pain or shortness of breath, or any other concerning symptoms, please return to the emergency department or call 911.

## 2023-03-15 NOTE — ED Notes (Signed)
Report given to Jamie RN

## 2023-03-15 NOTE — Progress Notes (Signed)
During patient's initial assessment on the floor, she was found to have unna boots on both legs. She stated that there was no wounds under the right one but there was one on the left leg. We discussed taking the unna boots off but she stated that she preferred we did not take them off because the nurse was coming out to her house Friday to change them. She does have two wounds on the back of her thighs and requested that a wound nurse come to examine those.

## 2023-03-15 NOTE — Telephone Encounter (Signed)
The patient called me from the [name], ED (room 17).  Started having rectal bleeding which concerned her greatly.  Came to our ED from her home in [place].  Saw the EDP Dr. Suezanne Jacquet; felt she was bleeding from hemorrhoids  - quite stable.  Slightly depressed hemoglobin.  She has had bleeding in the past.  History of colonic polyps removed.  She has multiple comorbidities and is scheduled to have dialysis in [place] tomorrow at 0530.  She is concerned that bleeding will continue and she will have to come back to the hospital.  After speaking to the EDP, bleeding felt to be relatively trivial.  She is quite concerned about the possibility of continued bleeding when she goes home.  Under the circumstances, I requested EDP arrange overnight observation.  We will see in consultation tomorrow morning and make a disposition.

## 2023-03-15 NOTE — ED Triage Notes (Signed)
Pt with bright red stool earlier today, noted on toilet paper and in the toilet.  Pt having to wear a pad due to the bleeding.  Pt is dialysis patient.

## 2023-03-15 NOTE — ED Provider Notes (Addendum)
[place] EMERGENCY DEPARTMENT AT [name] Provider Note  CSN: [id number] Arrival date & time: 03/15/23 1719  Chief Complaint(s) Rectal Bleeding  HPI Amy Moses is a 52 y.o. female history of end-stage renal disease, diabetes, hypertension, presenting to the emergency department with episode of rectal bleeding.  Patient reports today, she was wiping when she noticed bright red blood, felt a dripping into the toilet as well.  Reports her stool is always somewhat dark due to the iron that she takes without any significant change.  No lightheadedness or weakness, syncope.  No shortness of breath, chest pain.  No headaches.  Denies similar episode in the past.  Does report history of hemorrhoids.   Past Medical History Past Medical History:  Diagnosis Date   Anemia    Blindness of right eye with low vision in contralateral eye    s/p victrectomy   Chronic diastolic heart failure 03/30/2021   Diabetes mellitus, type II    Dyslipidemia    Glaucoma    History of blood transfusion    Hypertension    Hypothyroidism (acquired)    Kidney disease    Stage 5   Mitral regurgitation 11/16/2022   Pneumonia    Pulmonary hypertension, unspecified 09/12/2022   Patient Active Problem List   Diagnosis Date Noted   Bright red blood per rectum 03/15/2023   Mitral regurgitation 11/16/2022   Pressure injury of skin 10/01/2022   Proctitis 10/01/2022   Pulmonary hypertension, unspecified 09/12/2022   GERD (gastroesophageal reflux disease) 04/30/2022   Bacteremia 04/29/2022   Non-adherence to medical treatment 03/16/2022   Cutaneous abscess of right foot    Osteomyelitis of foot 02/14/2022   Obesity (BMI 30-39.9) 01/26/2022   Ischemic ulcer of right foot 01/24/2022   Normocytic anemia 01/24/2022   Sacral pressure ulcer 01/24/2022   Metabolic acidosis, increased anion gap 01/24/2022   Leg wound, left, sequela 12/10/2021   Open leg wound 09/03/2021   Wound infection     Non-pressure chronic ulcer of right calf limited to breakdown of skin    Calciphylaxis of right lower extremity with nonhealing ulcer, limited to breakdown of skin    Chronic ulcer of left thigh 08/07/2021   Calciphylaxis of left lower extremity with nonhealing ulcer with necrosis of muscle 08/07/2021   Lower GI bleed 05/12/2021   Acute GI bleeding 04/24/2021   Acute blood loss anemia 04/23/2021   GI bleed 04/22/2021   Fever 03/31/2021   Chronic diastolic heart failure 03/30/2021   ESRD (end stage renal disease) on dialysis 11/16/2020   Calciphylaxis 11/06/2020   Non-healing open wound of heel 11/03/2020   Diabetic foot infection 11/01/2020   Decubitus ulcer, heel 11/01/2020   Closed nondisplaced fracture of left patella 10/29/2020   Metabolic acidosis 06/11/2020   Acute on chronic renal failure 06/10/2020   Anemia of chronic disease 06/10/2020   Acute pericardial effusion 06/10/2020   Chronic kidney disease, stage 4 (severe) 03/05/2019   Vitamin D deficiency 01/28/2019   Type 2 diabetes mellitus with ESRD (end-stage renal disease) 09/21/2015   Mixed hyperlipidemia 09/21/2015   Primary hypertension 09/21/2015   Acquired hypothyroidism 09/21/2015   Iris bomb 07/31/2012   Secondary angle-closure glaucoma 07/31/2012   Home Medication(s) Prior to Admission medications   Medication Sig Start Date End Date Taking? Authorizing Provider  acetaminophen (TYLENOL) 500 MG tablet Take 1,000 mg by mouth every 6 (six) hours as needed for moderate pain.   Yes [provider]  atorvastatin (LIPITOR) 10 MG tablet Take  1 tablet (10 mg total) by mouth daily. 04/25/21  Yes Emokpae, Courage, MD  AURYXIA 1 GM 210 MG(Fe) tablet Take 630 mg by mouth 2 (two) times daily with a meal. 01/07/22  Yes [provider]  HUMALOG KWIKPEN 100 UNIT/ML KwikPen Inject 5-11 Units into the skin 3 (three) times daily with meals. If eats 50% or more of meal. Patient taking differently: Inject 5-11 Units  into the skin 3 (three) times daily with meals. If eats 50% or more of meal. Sliding scale 03/16/22  Yes Nida, Denman George, MD  insulin glargine (LANTUS) 100 UNIT/ML injection Inject 0.2 mLs (20 Units total) into the skin at bedtime. Patient taking differently: Inject 8 Units into the skin at bedtime. 04/06/22  Yes Nida, Denman George, MD  levothyroxine (SYNTHROID) 75 MCG tablet Take 75 mcg by mouth daily. 06/13/22  Yes [provider]  midodrine (PROAMATINE) 10 MG tablet Take 10 mg by mouth daily.   Yes [provider]  pantoprazole (PROTONIX) 40 MG tablet Take 1 tablet (40 mg total) by mouth 2 (two) times daily. 04/25/21  Yes Emokpae, Courage, MD  polyethylene glycol (MIRALAX / GLYCOLAX) 17 g packet Take 17 g by mouth daily. 10/02/22  Yes Vassie Loll, MD  Vitamin D, Ergocalciferol, (DRISDOL) 1.25 MG (50000 UNIT) CAPS capsule Take 50,000 Units by mouth every 7 (seven) days. Friday 04/20/21  Yes [provider]  Blood Glucose Monitoring Suppl (ACCU-CHEK GUIDE ME) w/Device KIT 1 Piece by Does not apply route as directed. 03/16/22   Roma Kayser, MD  Blood Pressure Monitor KIT TAKE BLOOD PRESSURE DAILY 09/15/22   Chilton Si, MD  Continuous Blood Gluc Receiver (FREESTYLE LIBRE 2 READER) DEVI As directed 04/06/22   Roma Kayser, MD  Continuous Blood Gluc Sensor (FREESTYLE LIBRE 2 SENSOR) MISC 1 Piece by Does not apply route every 14 (fourteen) days. 04/06/22   Roma Kayser, MD  glucose blood (ACCU-CHEK GUIDE) test strip Use as instructed 03/16/22   Roma Kayser, MD  midodrine (PROAMATINE) 5 MG tablet Take 10 mg by mouth See admin instructions. Take 1 hour prior to dialysis Patient not taking: Reported on 03/15/2023 09/27/22   [provider]                                                                                                                                    Past Surgical History Past Surgical History:  Procedure  Laterality Date   ABDOMINAL AORTOGRAM W/LOWER EXTREMITY Bilateral 12/18/2020   Procedure: ABDOMINAL AORTOGRAM W/LOWER EXTREMITY;  Surgeon: Sherren Kerns, MD;  Location: [name] INVASIVE CV LAB;  Service: Cardiovascular;  Laterality: Bilateral;   ABDOMINAL AORTOGRAM W/LOWER EXTREMITY Bilateral 01/25/2022   Procedure: ABDOMINAL AORTOGRAM W/LOWER EXTREMITY;  Surgeon: Nada Libman, MD;  Location: [facility] INVASIVE CV LAB;  Service: Cardiovascular;  Laterality: Bilateral;   AMPUTATION Right 02/16/2022   Procedure: RIGHT FOOT 5TH RAY AMPUTATION;  Surgeon:  Nadara Mustard, MD;  Location: [facility] OR;  Service: Orthopedics;  Laterality: Right;   ANKLE FRACTURE SURGERY Right    AV FISTULA PLACEMENT Left 08/18/2020   Procedure: LEFT ARM BRACHIOCEPHALIC ARTERIOVENOUS (AV) FISTULA CREATION;  Surgeon: Sherren Kerns, MD;  Location: [facility] OR;  Service: Vascular;  Laterality: Left;   BIOPSY  04/24/2021   Procedure: BIOPSY;  Surgeon: Lanelle Bal, DO;  Location: [facility] ENDO SUITE;  Service: Endoscopy;;   CESAREAN SECTION     CHOLECYSTECTOMY     COLONOSCOPY  04/24/2021   Surgeon: Lanelle Bal, DO;  nonbleeding internal hemorrhoids, 1 large (25 mm) pedunculated transverse colon polyp (prolapse type polyp) with adherent clot and stigmata of recent bleed.   COLONOSCOPY WITH PROPOFOL N/A 05/14/2021   Procedure: COLONOSCOPY WITH PROPOFOL;  Surgeon: Corbin Ade, MD;  Location: [facility] ENDO SUITE;  Service: Endoscopy;  Laterality: N/A;   ESOPHAGOGASTRODUODENOSCOPY (EGD) WITH PROPOFOL N/A 04/24/2021   Surgeon: Lanelle Bal, DO;  duodenal erosions and gastritis biopsied (pathology with peptic duodenitis, reactive gastropathy with erosions/chronic inflammation, negative for H. pylori)   EYE SURGERY     Vatrectomy   HEMOSTASIS CLIP PLACEMENT  05/14/2021   Procedure: HEMOSTASIS CLIP PLACEMENT;  Surgeon: Corbin Ade, MD;  Location: [facility] ENDO SUITE;  Service: Endoscopy;;   IR PERC TUN PERIT CATH WO PORT S&I /IMAG   09/15/2020   IR REMOVAL TUN CV CATH W/O FL  02/19/2021   IR US GUIDE VASC ACCESS RIGHT  09/15/2020   POLYPECTOMY  04/24/2021   Procedure: POLYPECTOMY;  Surgeon: Lanelle Bal, DO;  Location: [facility] ENDO SUITE;  Service: Endoscopy;;   POLYPECTOMY  05/14/2021   Procedure: POLYPECTOMY;  Surgeon: Corbin Ade, MD;  Location: [facility] ENDO SUITE;  Service: Endoscopy;;   SKIN SPLIT GRAFT Bilateral 09/03/2021   Procedure: SKIN GRAFT BILATERAL LEGS;  Surgeon: Nadara Mustard, MD;  Location: [facility] OR;  Service: Orthopedics;  Laterality: Bilateral;   SKIN SPLIT GRAFT Left 12/10/2021   Procedure: IRRIGATION AND DEBRIDEMENT LEFT CALF, APPLICATION SPLIT THICKNESS SKIN GRAFT;  Surgeon: Nadara Mustard, MD;  Location: [facility] OR;  Service: Orthopedics;  Laterality: Left;   TOE SURGERY     Family History Family History  Problem Relation Age of Onset   Heart failure Mother    Heart disease Mother    Diabetes Mother    Kidney disease Mother    Heart failure Father    Diabetes Father    Heart disease Father    Diabetes Brother    Heart failure Maternal Grandmother    Heart failure Maternal Grandfather    Transient ischemic attack Maternal Grandfather    Colon cancer Neg Hx     Social History Social History   Tobacco Use   Smoking status: Never   Smokeless tobacco: Never  Vaping Use   Vaping Use: Never used  Substance Use Topics   Alcohol use: No   Drug use: No   Allergies Ace inhibitors  Review of Systems Review of Systems  All other systems reviewed and are negative.   Physical Exam Vital Signs  I have reviewed the triage vital signs BP 129/81 (BP Location: Right Arm)   Pulse 70   Temp 98.5 F (36.9 C)   Resp 20   Ht 5\' 6"  (1.676 m)   Wt 97.5 kg   LMP 08/22/2015 (Approximate)   SpO2 95%   BMI 34.70 kg/m  Physical Exam Vitals and nursing note reviewed.  Constitutional:  General: She is not in acute distress.    Appearance: She is well-developed.  HENT:     Head: Normocephalic  and atraumatic.     Mouth/Throat:     Mouth: Mucous membranes are moist.  Eyes:     Pupils: Pupils are equal, round, and reactive to light.  Cardiovascular:     Rate and Rhythm: Normal rate and regular rhythm.     Heart sounds: No murmur heard. Pulmonary:     Effort: Pulmonary effort is normal. No respiratory distress.     Breath sounds: Normal breath sounds.  Abdominal:     General: Abdomen is flat.     Palpations: Abdomen is soft.     Tenderness: There is no abdominal tenderness.  Genitourinary:    Comments: Rectal exam chaperoned by RN Cala Bradford, multiple external hemorrhoids, 1 small hemorrhoid around 3:00 with fresh scab Musculoskeletal:        General: No tenderness.     Right lower leg: No edema.     Left lower leg: No edema.  Skin:    General: Skin is warm and dry.  Neurological:     General: No focal deficit present.     Mental Status: She is alert. Mental status is at baseline.  Psychiatric:        Mood and Affect: Mood normal.        Behavior: Behavior normal.     ED Results and Treatments Labs (all labs ordered are listed, but only abnormal results are displayed) Labs Reviewed  COMPREHENSIVE METABOLIC PANEL - Abnormal; Notable for the following components:      Result Value   Sodium 133 (*)    Glucose, Bld 187 (*)    BUN 48 (*)    Creatinine, Ser 7.13 (*)    Calcium 8.4 (*)    Alkaline Phosphatase 144 (*)    GFR, Estimated 7 (*)    All other components within normal limits  CBC WITH DIFFERENTIAL/PLATELET - Abnormal; Notable for the following components:   Hemoglobin 11.6 (*)    Platelets 103 (*)    All other components within normal limits  PROTIME-INR  TYPE AND SCREEN                                                                                                                          Radiology No results found.  Pertinent labs & imaging results that were available during my care of the patient were reviewed by me and considered in my medical  decision making (see MDM for details).  Medications Ordered in ED Medications - No data to display  Procedures Procedures  (including critical care time)  Medical Decision Making / ED Course   MDM:  52 year old female presenting with rectal bleeding.  Patient does have numerous external hemorrhoids and has 1 with a fresh appearing scab on it.  Suspect this is the most likely cause of her rectal bleeding.  Will obtain labs including CBC, type and screen.  Patient hemodynamically stable.  No ongoing bleeding noted.  If labs reassuring, likely discharged with follow-up with GI.  Clinical Course as of 03/15/23 2149  Wed Mar 15, 2023  2017 Laboratory testing reassuring.  Her hemoglobin is improved from previous checks.  She is hemodynamically stable.  However discussed with Dr. Jena Gauss on-call GI who request that the patient is admitted, they will consult at this time and further management.  Her primary GI physician Dr. Marletta Lor is on-call tomorrow. [WS]    Clinical Course User Index [WS] Lonell Grandchild, MD     Additional history obtained: -Additional history obtained from family -External records from outside source obtained and reviewed including: Chart review including previous notes, labs, imaging, consultation notes including prior colonoscopy   Lab Tests: -I ordered, reviewed, and interpreted labs.   The pertinent results include:   Labs Reviewed  COMPREHENSIVE METABOLIC PANEL - Abnormal; Notable for the following components:      Result Value   Sodium 133 (*)    Glucose, Bld 187 (*)    BUN 48 (*)    Creatinine, Ser 7.13 (*)    Calcium 8.4 (*)    Alkaline Phosphatase 144 (*)    GFR, Estimated 7 (*)    All other components within normal limits  CBC WITH DIFFERENTIAL/PLATELET - Abnormal; Notable for the following components:   Hemoglobin  11.6 (*)    Platelets 103 (*)    All other components within normal limits  PROTIME-INR  TYPE AND SCREEN    Notable for borderline anemia, improved from previous   Medicines ordered and prescription drug management: No orders of the defined types were placed in this encounter.   -I have reviewed the patients home medicines and have made adjustments as needed   Social Determinants of Health:  Diagnosis or treatment significantly limited by social determinants of health: lives in [place]   Co morbidities that complicate the patient evaluation  Past Medical History:  Diagnosis Date   Anemia    Blindness of right eye with low vision in contralateral eye    s/p victrectomy   Chronic diastolic heart failure 03/30/2021   Diabetes mellitus, type II    Dyslipidemia    Glaucoma    History of blood transfusion    Hypertension    Hypothyroidism (acquired)    Kidney disease    Stage 5   Mitral regurgitation 11/16/2022   Pneumonia    Pulmonary hypertension, unspecified 09/12/2022      Dispostion: Disposition decision including need for hospitalization was considered, and patient Admitted    Final Clinical Impression(s) / ED Diagnoses Final diagnoses:  Rectal bleeding     This chart was dictated using voice recognition software.  Despite best efforts to proofread,  errors can occur which can change the documentation meaning.    Lonell Grandchild, MD 03/15/23 2024    Lonell Grandchild, MD 03/15/23 2149

## 2023-03-15 NOTE — H&P (Signed)
TRH H&P   Patient Demographics:    Amy Moses, is a 52 y.o. female  MRN: [id number]   DOB - 09-09-1971  Admit Date - 03/15/2023  Outpatient Primary MD for the patient is Lorelei Pont, DO  Referring MD/NP/PA: Dr Suezanne Jacquet    Patient coming from: home  Chief Complaint  Patient presents with   Rectal Bleeding      HPI:    Amy Moses  is a 52 y.o. female, e with medical history significant of blindness of the right eye, chronic diastolic CHF, diabetes mellitus type 2, dyslipidemia, hypertension, hypothyroidism, ESRD on HD, pulmonary hypertension, with admission in November 2023 due to bright red blood per rectum related to proctitis. -Presents to ED today secondary to complaints of bright red blood per rectum, this has started today, patient reports intermittent constipation overall, reports she noticed bright red blood when she was wiping out, it was dripping in the toilet, she is with known history of proctitis and hemorrhoids in the past, she denies chest pain, shortness of breath, dizziness, lightheadedness. -in  ED her workup significant for stable hemoglobin, at 11.6 which is better than her baseline, which is 10.1 last November on physical exam by ED physician she was noted to have external hemorrhoid with 1 hemorrhoid with active bleeding, open ED edition discussion with GI, it was requested patient to be admitted for observation overnight, and GI to consult regarding further recommendation in AM.    Review of systems:      A full 10 point Review of Systems was done, except as stated above, all other Review of Systems were negative.   With Past History of the following :    Past Medical History:  Diagnosis Date   Anemia    Blindness of right eye with low vision in contralateral eye    s/p victrectomy   Chronic diastolic heart failure 03/30/2021   Diabetes  mellitus, type II    Dyslipidemia    Glaucoma    History of blood transfusion    Hypertension    Hypothyroidism (acquired)    Kidney disease    Stage 5   Mitral regurgitation 11/16/2022   Pneumonia    Pulmonary hypertension, unspecified 09/12/2022      Past Surgical History:  Procedure Laterality Date   ABDOMINAL AORTOGRAM W/LOWER EXTREMITY Bilateral 12/18/2020   Procedure: ABDOMINAL AORTOGRAM W/LOWER EXTREMITY;  Surgeon: Sherren Kerns, MD;  Location: [facility];  Service: Cardiovascular;  Laterality: Bilateral;   ABDOMINAL AORTOGRAM W/LOWER EXTREMITY Bilateral 01/25/2022   Procedure: ABDOMINAL AORTOGRAM W/LOWER EXTREMITY;  Surgeon: Nada Libman, MD;  Location: [facility];  Service: Cardiovascular;  Laterality: Bilateral;   AMPUTATION Right 02/16/2022   Procedure: RIGHT FOOT 5TH RAY AMPUTATION;  Surgeon: Nadara Mustard, MD;  Location: [facility] OR;  Service: Orthopedics;  Laterality: Right;   ANKLE FRACTURE SURGERY Right  AV FISTULA PLACEMENT Left 08/18/2020   Procedure: LEFT ARM BRACHIOCEPHALIC ARTERIOVENOUS (AV) FISTULA CREATION;  Surgeon: Sherren Kerns, MD;  Location[facility] OR;  Service: Vascular;  Laterality: Left;   BIOPSY  04/24/2021   Procedure: BIOPSY;  Surgeon: Lanelle Bal, DO;  Location: [facility];  Service: Endoscopy;;   CESAREAN SECTION     CHOLECYSTECTOMY     COLONOSCOPY  04/24/2021   Surgeon: Lanelle Bal, DO;  nonbleeding internal hemorrhoids, 1 large (25 mm) pedunculated transverse colon polyp (prolapse type polyp) with adherent clot and stigmata of recent bleed.   COLONOSCOPY WITH PROPOFOL N/A 05/14/2021   Procedure: COLONOSCOPY WITH PROPOFOL;  Surgeon: Corbin Ade, MD;  Location: [facility];  Service: Endoscopy;  Laterality: N/A;   ESOPHAGOGASTRODUODENOSCOPY (EGD) WITH PROPOFOL N/A 04/24/2021   Surgeon: Lanelle Bal, DO;  duodenal erosions and gastritis biopsied (pathology with peptic duodenitis, reactive gastropathy with  erosions/chronic inflammation, negative for H. pylori)   EYE SURGERY     Vatrectomy   HEMOSTASIS CLIP PLACEMENT  05/14/2021   Procedure: HEMOSTASIS CLIP PLACEMENT;  Surgeon: Corbin Ade, MD;  Location: [facility];  Service: Endoscopy;;   IR PERC TUN PERIT CATH WO PORT S&I /IMAG  09/15/2020   IR REMOVAL TUN CV CATH W/O FL  02/19/2021   IR US GUIDE VASC ACCESS RIGHT  09/15/2020   POLYPECTOMY  04/24/2021   Procedure: POLYPECTOMY;  Surgeon: Lanelle Bal, DO;  Location: [facility];  Service: Endoscopy;;   POLYPECTOMY  05/14/2021   Procedure: POLYPECTOMY;  Surgeon: Corbin Ade, MD;  Location: [facility];  Service: Endoscopy;;   SKIN SPLIT GRAFT Bilateral 09/03/2021   Procedure: SKIN GRAFT BILATERAL LEGS;  Surgeon: Nadara Mustard, MD;  Location: [facility] OR;  Service: Orthopedics;  Laterality: Bilateral;   SKIN SPLIT GRAFT Left 12/10/2021   Procedure: IRRIGATION AND DEBRIDEMENT LEFT CALF, APPLICATION SPLIT THICKNESS SKIN GRAFT;  Surgeon: Nadara Mustard, MD;  Location: [facility] OR;  Service: Orthopedics;  Laterality: Left;   TOE SURGERY        Social History:     Social History   Tobacco Use   Smoking status: Never   Smokeless tobacco: Never  Substance Use Topics   Alcohol use: No       Family History :     Family History  Problem Relation Age of Onset   Heart failure Mother    Heart disease Mother    Diabetes Mother    Kidney disease Mother    Heart failure Father    Diabetes Father    Heart disease Father    Diabetes Brother    Heart failure Maternal Grandmother    Heart failure Maternal Grandfather    Transient ischemic attack Maternal Grandfather    Colon cancer Neg Hx       Home Medications:   Prior to Admission medications   Medication Sig Start Date End Date Taking? Authorizing Provider  acetaminophen (TYLENOL) 500 MG tablet Take 1,000 mg by mouth every 6 (six) hours as needed for moderate pain.   Yes [provider]  atorvastatin (LIPITOR)  10 MG tablet Take 1 tablet (10 mg total) by mouth daily. 04/25/21  Yes Emokpae, Courage, MD  AURYXIA 1 GM 210 MG(Fe) tablet Take 630 mg by mouth 2 (two) times daily with a meal. 01/07/22  Yes [provider]  HUMALOG KWIKPEN 100 UNIT/ML KwikPen Inject 5-11 Units into the skin 3 (three) times daily with meals. If  eats 50% or more of meal. Patient taking differently: Inject 5-11 Units into the skin 3 (three) times daily with meals. If eats 50% or more of meal. Sliding scale 03/16/22  Yes Nida, Denman George, MD  insulin glargine (LANTUS) 100 UNIT/ML injection Inject 0.2 mLs (20 Units total) into the skin at bedtime. Patient taking differently: Inject 8 Units into the skin at bedtime. 04/06/22  Yes Nida, Denman George, MD  levothyroxine (SYNTHROID) 75 MCG tablet Take 75 mcg by mouth daily. 06/13/22  Yes [provider]  midodrine (PROAMATINE) 10 MG tablet Take 10 mg by mouth daily.   Yes [provider]  pantoprazole (PROTONIX) 40 MG tablet Take 1 tablet (40 mg total) by mouth 2 (two) times daily. 04/25/21  Yes Emokpae, Courage, MD  polyethylene glycol (MIRALAX / GLYCOLAX) 17 g packet Take 17 g by mouth daily. 10/02/22  Yes Vassie Loll, MD  Vitamin D, Ergocalciferol, (DRISDOL) 1.25 MG (50000 UNIT) CAPS capsule Take 50,000 Units by mouth every 7 (seven) days. Friday 04/20/21  Yes [provider]  Blood Glucose Monitoring Suppl (ACCU-CHEK GUIDE ME) w/Device KIT 1 Piece by Does not apply route as directed. 03/16/22   Roma Kayser, MD  Blood Pressure Monitor KIT TAKE BLOOD PRESSURE DAILY 09/15/22   Chilton Si, MD  Continuous Blood Gluc Receiver (FREESTYLE LIBRE 2 READER) DEVI As directed 04/06/22   Roma Kayser, MD  Continuous Blood Gluc Sensor (FREESTYLE LIBRE 2 SENSOR) MISC 1 Piece by Does not apply route every 14 (fourteen) days. 04/06/22   Roma Kayser, MD  glucose blood (ACCU-CHEK GUIDE) test strip Use as instructed 03/16/22   Roma Kayser, MD  midodrine (PROAMATINE) 5 MG tablet Take 10 mg by mouth See admin instructions. Take 1 hour prior to dialysis Patient not taking: Reported on 03/15/2023 09/27/22   [provider]     Allergies:     Allergies  Allergen Reactions   Ace Inhibitors Cough     Physical Exam:   Vitals  Blood pressure 129/81, pulse 70, temperature 98.5 F (36.9 C), resp. rate 20, height 5\' 6"  (1.676 m), weight 97.5 kg, last menstrual period 08/22/2015, SpO2 95 %.   1. General Developed female, laying in bed, no apparent distress  2. Normal affect and insight, Not Suicidal or Homicidal, Awake Alert, Oriented X 3.  3. No F.N deficits, ALL C.Nerves Intact, Strength 5/5 all 4 extremities, Sensation intact all 4 extremities, Plantars down going.  4.  right eye blind. Moist Oral Mucosa.  5. Supple Neck, No JVD, No cervical lymphadenopathy appriciated, No Carotid Bruits.  6. Symmetrical Chest wall movement, Good air movement bilaterally, CTAB.  7. RRR, No Gallops, Rubs , No Parasternal Heave.  8. Positive Bowel Sounds, Abdomen Soft, No tenderness, No organomegaly appriciated,No rebound -guarding or rigidity.  9.  No Cyanosis, bilateral lower extremity with Ace/Unna boot wrap  10. Good muscle tone,  joints appear normal , no effusions, Normal ROM.   Data Review:    CBC Recent Labs  Lab 03/15/23 1850  WBC 7.0  HGB 11.6*  HCT 37.6  PLT 103*  MCV 95.9  MCH 29.6  MCHC 30.9  RDW 14.8  LYMPHSABS 1.0  MONOABS 0.6  EOSABS 0.2  BASOSABS 0.0   ------------------------------------------------------------------------------------------------------------------  Chemistries  Recent Labs  Lab 03/15/23 1850  NA 133*  K 4.8  CL 99  CO2 23  GLUCOSE 187*  BUN 48*  CREATININE 7.13*  CALCIUM 8.4*  AST 15  ALT 16  ALKPHOS 144*  BILITOT 0.7   ------------------------------------------------------------------------------------------------------------------ estimated  creatinine clearance is 11.2 mL/min (A) (by C-G formula based on SCr of 7.13 mg/dL (H)). ------------------------------------------------------------------------------------------------------------------ No results for input(s): "TSH", "T4TOTAL", "T3FREE", "THYROIDAB" in the last 72 hours.  Invalid input(s): "FREET3"  Coagulation profile Recent Labs  Lab 03/15/23 1850  INR 1.1   ------------------------------------------------------------------------------------------------------------------- No results for input(s): "DDIMER" in the last 72 hours. -------------------------------------------------------------------------------------------------------------------  Cardiac Enzymes No results for input(s): "CKMB", "TROPONINI", "MYOGLOBIN" in the last 168 hours.  Invalid input(s): "CK" ------------------------------------------------------------------------------------------------------------------    Component Value Date/Time   BNP 1,068.9 (H) 03/30/2021 1658     ---------------------------------------------------------------------------------------------------------------  Urinalysis No results found for: "COLORURINE", "APPEARANCEUR", "LABSPEC", "PHURINE", "GLUCOSEU", "HGBUR", "BILIRUBINUR", "KETONESUR", "PROTEINUR", "UROBILINOGEN", "NITRITE", "LEUKOCYTESUR"  ----------------------------------------------------------------------------------------------------------------   Imaging Results:    No results found.    Assessment & Plan:    Principal Problem:   Bright red blood per rectum Active Problems:   Acquired hypothyroidism   GERD (gastroesophageal reflux disease)   Mitral regurgitation   Bright red blood per rectum -Developed today, she reports history of constipation, physical exam by ED physician she was noted to have external hemorrhoids, with 1 hemorrhoid bleeding, will be admitted overnight for observation per GI physician request.  She had history of proctitis  presenting with the same in the past. -Will repeat CBC in a.m. -While await further recommendation from GI in a.m. -Keep on clear liquid diet, and n.p.o. after breakfast -SCD for DVT prophylaxis   ESRD (end stage renal disease) on dialysis (HCC) - HD TTS schedule, renal consult entered to be dialyzed tomorrow  Type 2 diabetes mellitus with ESRD (end-stage renal disease) (HCC) -Resume Lantus at a lower dose, and will keep on insulin sliding scale   Acquired hypothyroidism Continue Synthroid   Primary hypertension -Appears not to be in any antihypertensive medications, actually she is on midodrine which will be continued   hyperlipidemia -Continue with statin   DVT Prophylaxis SCDs   AM Labs Ordered, also please review Full Orders  Family Communication: Admission, patients condition and plan of care including tests being ordered have been discussed with the patient and daughter at bedside who indicate understanding and agree with the plan and Code Status.  Code Status Full  Likely DC to  home  Condition GUARDED    Consults called: GI aware of patient, as well renal aware of the patient for HD tomorrow.    Admission status: observation    Time spent in minutes : 55 minutes   Huey Bienenstock M.D on 03/15/2023 at 9:52 PM   [facility] - Office  [phone]

## 2023-03-16 DIAGNOSIS — K625 Hemorrhage of anus and rectum: Secondary | ICD-10-CM | POA: Diagnosis not present

## 2023-03-16 DIAGNOSIS — D696 Thrombocytopenia, unspecified: Secondary | ICD-10-CM | POA: Diagnosis not present

## 2023-03-16 DIAGNOSIS — D122 Benign neoplasm of ascending colon: Secondary | ICD-10-CM | POA: Diagnosis not present

## 2023-03-16 LAB — CBC
HCT: 35.3 % — ABNORMAL LOW (ref 36.0–46.0)
Hemoglobin: 10.8 g/dL — ABNORMAL LOW (ref 12.0–15.0)
MCH: 29.2 pg (ref 26.0–34.0)
MCHC: 30.6 g/dL (ref 30.0–36.0)
MCV: 95.4 fL (ref 80.0–100.0)
Platelets: 100 10*3/uL — ABNORMAL LOW (ref 150–400)
RBC: 3.7 MIL/uL — ABNORMAL LOW (ref 3.87–5.11)
RDW: 14.6 % (ref 11.5–15.5)
WBC: 6.2 10*3/uL (ref 4.0–10.5)
nRBC: 0 % (ref 0.0–0.2)

## 2023-03-16 LAB — BASIC METABOLIC PANEL
Anion gap: 11 (ref 5–15)
BUN: 51 mg/dL — ABNORMAL HIGH (ref 6–20)
CO2: 22 mmol/L (ref 22–32)
Calcium: 8.3 mg/dL — ABNORMAL LOW (ref 8.9–10.3)
Chloride: 100 mmol/L (ref 98–111)
Creatinine, Ser: 7.45 mg/dL — ABNORMAL HIGH (ref 0.44–1.00)
GFR, Estimated: 6 mL/min — ABNORMAL LOW (ref >60)
Glucose, Bld: 157 mg/dL — ABNORMAL HIGH (ref 70–99)
Potassium: 4.6 mmol/L (ref 3.5–5.1)
Sodium: 133 mmol/L — ABNORMAL LOW (ref 135–145)

## 2023-03-16 LAB — HEPATITIS B SURFACE ANTIGEN: Hepatitis B Surface Ag: NONREACTIVE

## 2023-03-16 LAB — GLUCOSE, CAPILLARY
Glucose-Capillary: 147 mg/dL — ABNORMAL HIGH (ref 70–99)
Glucose-Capillary: 98 mg/dL (ref 70–99)
Glucose-Capillary: 94 mg/dL (ref 70–99)
Glucose-Capillary: 86 mg/dL (ref 70–99)

## 2023-03-16 MED ORDER — PEG 3350-KCL-NA BICARB-NACL 420 G PO SOLR
4000.0000 mL | Freq: Once | ORAL | Status: AC
  Administered 2023-03-16: 4000 mL via ORAL
  Filled 2023-03-16: qty 4000

## 2023-03-16 MED ORDER — MIDODRINE HCL 5 MG PO TABS: 10.0000 mg | ORAL_TABLET | ORAL | Status: DC

## 2023-03-16 MED ORDER — CHLORHEXIDINE GLUCONATE CLOTH 2 % EX PADS
6.0000 | MEDICATED_PAD | Freq: Every day | CUTANEOUS | Status: DC
  Administered 2023-03-16 – 2023-03-17 (×2): 6 via TOPICAL

## 2023-03-16 MED ORDER — BISACODYL 5 MG PO TBEC
15.0000 mg | DELAYED_RELEASE_TABLET | Freq: Once | ORAL | Status: AC
  Administered 2023-03-16: 15 mg via ORAL
  Filled 2023-03-16: qty 3

## 2023-03-16 NOTE — Hospital Course (Signed)
Amy Moses is a 52 y.o. female with a history of right eye blindness, chronic diastolic heart failure, hypothyroidism, ESRD on HD, pulmonary hypertension. Patient presented secondary to bright red blood per rectum concerning for possible GI bleeding. GI consulted for evaluation and management.

## 2023-03-16 NOTE — Progress Notes (Signed)
Received patient in bed to unit.  Alert and oriented.  Informed consent signed and in chart.   TX duration:3.75 h.  Patient tolerated well.  Transported back to the room  Alert, without acute distress.  Hand-off given to patient's nurse.   Access used: AVF Access issues: none  Total UF removed: 2.5 L Medication(s) given: none Post HD VS: 123/78 P 77. R 20. O2 sat 100 % in room air. Post HD weight: 90 Kg   [facility]

## 2023-03-16 NOTE — TOC Progression Note (Signed)
  Transition of Care [name]) Screening Note   Patient Details  Name: Yuliya Nova Date of Birth: Feb 22, 1971   [facility] of [facility] CM/SW Contact:    Elliot Gault, LCSW Phone Number: 03/16/2023, 11:02 AM    Transition of Care Department [facility]) has reviewed patient and no TOC needs have been identified at this time. We will continue to monitor patient advancement through interdisciplinary progression rounds. If new patient transition needs arise, please place a TOC consult.

## 2023-03-16 NOTE — Progress Notes (Signed)
PROGRESS NOTE    Amy Moses  ZOX:[id number] DOB: Apr 11, 1971 DOA: 03/15/2023 PCP: Lorelei Pont, DO   Brief Narrative: Amy Moses is a 52 y.o. female with a history of right eye blindness, chronic diastolic heart failure, hypothyroidism, ESRD on HD, pulmonary hypertension. Patient presented secondary to bright red blood per rectum concerning for possible GI bleeding. GI consulted for evaluation and management.   Assessment and Plan:  Bright red blood per rectum Concern for lower GI bleeding. Patient has a history of hemorrhoids which may be etiology of bleeding. GI consulted for evaluation and management with recommendations pending. -CBC daily; more frequent if patient develops more frequent bleeding  Anemia of chronic disease In setting of chronic kidney disease. Hemoglobin is stable.   ESRD on HD Nephrology consulted for hemodialysis while inpatient.  Diabetes mellitus type 2 Uncontrolled with hyperglycemia. Patient is managed on Humalog with TID with meals and Lantus 5 units daily. Last hemoglobin A1C of 9.4% from 09/2022. Patient resumed on home dose of insulin glargine and started on SSI -Continue Semglee 5 units daily and SSI  Thrombocytopenia Chronic. Stable.  Hypothyroidism -Continue Synthroid  Hypotension Secondary to hemodialysis. Patient is prescribed midodrine for management but is not taking as an outpatient. -Continue midodrine with HD  Hyperlipidemia -Continue Lipitor  Pressure injury Left posterior/proximal thigh. Present on admission.   DVT prophylaxis: SCDs Code Status:   Code Status: Full Code Family Communication: None at bedside Disposition Plan: Discharge home possibly in 24 hours pending GI recommendations   Consultants:  Gastroenterology  Procedures:  None  Antimicrobials: None    Subjective: Patient reports passing a large blood clot per rectum this morning. No other concerns.  Objective: BP 103/64 (BP Location:  Right Arm)   Pulse 73   Temp 98.4 F (36.9 C)   Resp 18   Ht  (1.676 m)   Wt 97.5 kg   LMP 08/22/2015 (Approximate)   SpO2 100%   BMI 34.70 kg/m   Examination:  General exam: Appears calm and comfortable Respiratory system: Clear to auscultation. Respiratory effort normal. Cardiovascular system: S1 & S2 heard, RRR. 2/6 systolic heart murmur Gastrointestinal system: Abdomen is nondistended, soft and nontender. Normal bowel sounds heard. Central nervous system: Alert and oriented. No focal neurological deficits. Musculoskeletal: No edema. No calf tenderness Psychiatry: Judgement and insight appear normal. Mood & affect appropriate.    Data Reviewed: I have personally reviewed following labs and imaging studies  CBC Lab Results  Component Value Date   WBC 6.2 03/16/2023   RBC 3.70 (L) 03/16/2023   HGB 10.8 (L) 03/16/2023   HCT 35.3 (L) 03/16/2023   MCV 95.4 03/16/2023   MCH 29.2 03/16/2023   PLT 100 (L) 03/16/2023   MCHC 30.6 03/16/2023   RDW 14.6 03/16/2023   LYMPHSABS 1.0 03/15/2023   MONOABS 0.6 03/15/2023   EOSABS 0.2 03/15/2023   BASOSABS 0.0 03/15/2023     Last metabolic panel Lab Results  Component Value Date   NA 133 (L) 03/16/2023   K 4.6 03/16/2023   CL 100 03/16/2023   CO2 22 03/16/2023   BUN 51 (H) 03/16/2023   CREATININE 7.45 (H) 03/16/2023   GLUCOSE 157 (H) 03/16/2023   GFRNONAA 6 (L) 03/16/2023   GFRAA 10 (L) 06/11/2020   CALCIUM 8.3 (L) 03/16/2023   PHOS 6.6 (H) 10/01/2022   PROT 7.1 03/15/2023   ALBUMIN 4.0 03/15/2023   BILITOT 0.7 03/15/2023   ALKPHOS 144 (H) 03/15/2023   AST 15 03/15/2023  ALT 16 03/15/2023   ANIONGAP 11 03/16/2023    GFR: Estimated Creatinine Clearance: 10.8 mL/min (A) (by C-G formula based on SCr of 7.45 mg/dL (H)).  No results found for this or any previous visit (from the past 240 hour(s)).    Radiology Studies: No results found.    LOS: 0 days    Jacquelin Hawking, MD [facility] 03/16/2023,  9:37 AM   If 7PM-7AM, please contact night-coverage [email or web address]

## 2023-03-16 NOTE — Consult Note (Signed)
Gastroenterology Consult   Referring Provider: No ref. provider found Primary Care Physician:  Lorelei Pont, DO Primary Gastroenterologist: Hennie Duos. Marletta Lor, DO   Patient ID: Amy Moses; [id number]; 11-27-1970   Admit date: 03/15/2023  LOS: 0 days   Date of Consultation: 03/16/2023  Reason for Consultation:  BRBPR    History of Present Illness   Amy Moses is a 52 y.o. female  with medical history significant of blindness of the right eye, chronic diastolic CHF, diabetes mellitus type 2, dyslipidemia, hypertension, hypothyroidism, ESRD on HD, pulmonary hypertension, with admission in November 2023 due to bright red blood per rectum related to proctitis, presents to the ED with bright red blood per rectum that started day of admission.  Patient reports intermittent constipation at baseline.  Generally will take MiraLAX daily as needed.  Last week she had a bowel movement almost every day except for maybe once.  Denies any rectal pain or abdominal pain.  Because she is wheelchair-bound, she often sits on the toilet while she is taking a bath.  Yesterday she had a bowel movement and noted fresh blood per rectum with wiping.  Also appeared to have some small clots in the toilet.  She showed me a picture.  While sitting on the toilet bathing she could hear dripping into the toilet.  She noted fresh blood dripping.  Continue to have bleeding into her underwear after getting dressed.  She denies any straining for bowel movement.  She takes heparin during dialysis.  No NSAIDs or aspirin use.  No heartburn, vomiting.  In the ED on rectal exam noted to have multiple external hemorrhoids, 1 small hemorrhoid around 3:00 with fresh scab. Sodium 133, creatinine 7.13, alkaline phosphatase 144, hemoglobin 11.6 (hemoglobin 10.21 September 2022), platelets 103,000.  Today her hemoglobin is 10.8, sodium 136, potassium 4.6.  Around 730 this morning patient was noted to pass a large bright red blood  clot the size of palm of the hand.  During my evaluation of the patient she had another stool, loose dark green with no visible blood.  External examination of the anorectal area, 2 large dressings noted anteriorly for wound coverage.  Large hemorrhoid noted externally with no active bleeding.  EGD June 2022: -Gastritis status post biopsy, reactive gastropathy with erosions and chronic inflammation.  No H. pylori -Duodenal erosions without bleeding status post biopsy, Peptic duodenitis  Colonoscopy April 24 2021: -Skin tags found on perianal exam -Nonbleeding internal hemorrhoids -one 5 mm polyp in the transverse colon removed, prolapse type polyp  Colonoscopy May 14, 2021: -one 5 mm polyp in the proximal rectum removed, mucosal prolapse polyp -Suspected post polypectomy bleed at site of proximal transverse/hepatic flexure status post 3 clips placed. -Repeat colonoscopy in 3 years   Prior to Admission medications   Medication Sig Start Date End Date Taking? Authorizing Provider  acetaminophen (TYLENOL) 500 MG tablet Take 1,000 mg by mouth every 6 (six) hours as needed for moderate pain.   Yes [provider]  atorvastatin (LIPITOR) 10 MG tablet Take 1 tablet (10 mg total) by mouth daily. 04/25/21  Yes Emokpae, Courage, MD  AURYXIA 1 GM 210 MG(Fe) tablet Take 630 mg by mouth 2 (two) times daily with a meal. 01/07/22  Yes [provider]  HUMALOG KWIKPEN 100 UNIT/ML KwikPen Inject 5-11 Units into the skin 3 (three) times daily with meals. If eats 50% or more of meal. Patient taking differently: Inject 5-11 Units into the skin 3 (three) times daily with meals.  If eats 50% or more of meal. Sliding scale 03/16/22  Yes Nida, Denman George, MD  insulin glargine (LANTUS) 100 UNIT/ML injection Inject 0.2 mLs (20 Units total) into the skin at bedtime. Patient taking differently: Inject 8 Units into the skin at bedtime. 04/06/22  Yes Nida, Denman George, MD  levothyroxine (SYNTHROID) 75  MCG tablet Take 75 mcg by mouth daily. 06/13/22  Yes [provider]  midodrine (PROAMATINE) 10 MG tablet Take 10 mg by mouth daily.   Yes [provider]  pantoprazole (PROTONIX) 40 MG tablet Take 1 tablet (40 mg total) by mouth 2 (two) times daily. 04/25/21  Yes Emokpae, Courage, MD  polyethylene glycol (MIRALAX / GLYCOLAX) 17 g packet Take 17 g by mouth daily. 10/02/22  Yes Vassie Loll, MD  Vitamin D, Ergocalciferol, (DRISDOL) 1.25 MG (50000 UNIT) CAPS capsule Take 50,000 Units by mouth every 7 (seven) days. Friday 04/20/21  Yes [provider]  Blood Glucose Monitoring Suppl (ACCU-CHEK GUIDE ME) w/Device KIT 1 Piece by Does not apply route as directed. 03/16/22   Roma Kayser, MD  Blood Pressure Monitor KIT TAKE BLOOD PRESSURE DAILY 09/15/22   Chilton Si, MD  Continuous Blood Gluc Receiver (FREESTYLE LIBRE 2 READER) DEVI As directed 04/06/22   Roma Kayser, MD  Continuous Blood Gluc Sensor (FREESTYLE LIBRE 2 SENSOR) MISC 1 Piece by Does not apply route every 14 (fourteen) days. 04/06/22   Roma Kayser, MD  glucose blood (ACCU-CHEK GUIDE) test strip Use as instructed 03/16/22   Roma Kayser, MD  midodrine (PROAMATINE) 5 MG tablet Take 10 mg by mouth See admin instructions. Take 1 hour prior to dialysis Patient not taking: Reported on 03/15/2023 09/27/22   [provider]    Current Facility-Administered Medications  Medication Dose Route Frequency Provider Last Rate Last Admin   acetaminophen (TYLENOL) tablet 650 mg  650 mg Oral Q6H PRN Elgergawy, Leana Roe, MD       Or   acetaminophen (TYLENOL) suppository 650 mg  650 mg Rectal Q6H PRN Elgergawy, Leana Roe, MD       atorvastatin (LIPITOR) tablet 10 mg  10 mg Oral Daily Elgergawy, Leana Roe, MD       ferric citrate (AURYXIA) tablet 630 mg  630 mg Oral BID WC Elgergawy, Leana Roe, MD       hydrALAZINE (APRESOLINE) injection 5 mg  5 mg Intravenous Q4H PRN Elgergawy, Leana Roe,  MD       insulin aspart (novoLOG) injection 0-5 Units  0-5 Units Subcutaneous QHS Elgergawy, Dawood S, MD       insulin aspart (novoLOG) injection 0-9 Units  0-9 Units Subcutaneous TID WC Elgergawy, Leana Roe, MD       insulin glargine-yfgn (SEMGLEE) injection 5 Units  5 Units Subcutaneous QHS Elgergawy, Leana Roe, MD   5 Units at 03/15/23 2215   levothyroxine (SYNTHROID) tablet 75 mcg  75 mcg Oral Daily Elgergawy, Leana Roe, MD   75 mcg at 03/16/23 0534   midodrine (PROAMATINE) tablet 10 mg  10 mg Oral Daily Elgergawy, Leana Roe, MD       pantoprazole (PROTONIX) EC tablet 40 mg  40 mg Oral BID Elgergawy, Leana Roe, MD   40 mg at 03/15/23 2215   polyethylene glycol (MIRALAX / GLYCOLAX) packet 17 g  17 g Oral Daily Elgergawy, Leana Roe, MD        Allergies as of 03/15/2023 - Review Complete 03/15/2023  Allergen Reaction Noted   Ace inhibitors Cough  11/19/2020    Past Medical History:  Diagnosis Date   Anemia    Blindness of right eye with low vision in contralateral eye    s/p victrectomy   Chronic diastolic heart failure 03/30/2021   Diabetes mellitus, type II    Dyslipidemia    Glaucoma    History of blood transfusion    Hypertension    Hypothyroidism (acquired)    Kidney disease    Stage 5   Mitral regurgitation 11/16/2022   Pneumonia    Pulmonary hypertension, unspecified 09/12/2022    Past Surgical History:  Procedure Laterality Date   ABDOMINAL AORTOGRAM W/LOWER EXTREMITY Bilateral 12/18/2020   Procedure: ABDOMINAL AORTOGRAM W/LOWER EXTREMITY;  Surgeon: Sherren Kerns, MD;  Location: [facility];  Service: Cardiovascular;  Laterality: Bilateral;   ABDOMINAL AORTOGRAM W/LOWER EXTREMITY Bilateral 01/25/2022   Procedure: ABDOMINAL AORTOGRAM W/LOWER EXTREMITY;  Surgeon: Nada Libman, MD;  Location: [facility];  Service: Cardiovascular;  Laterality: Bilateral;   AMPUTATION Right 02/16/2022   Procedure: RIGHT FOOT 5TH RAY AMPUTATION;  Surgeon: Nadara Mustard, MD;   Location: [facility] OR;  Service: Orthopedics;  Laterality: Right;   ANKLE FRACTURE SURGERY Right    AV FISTULA PLACEMENT Left 08/18/2020   Procedure: LEFT ARM BRACHIOCEPHALIC ARTERIOVENOUS (AV) FISTULA CREATION;  Surgeon: Sherren Kerns, MD;  Location: [facility] OR;  Service: Vascular;  Laterality: Left;   BIOPSY  04/24/2021   Procedure: BIOPSY;  Surgeon: Lanelle Bal, DO;  Location: [facility];  Service: Endoscopy;;   CESAREAN SECTION     CHOLECYSTECTOMY     COLONOSCOPY  04/24/2021   Surgeon: Lanelle Bal, DO;  nonbleeding internal hemorrhoids, 1 large (25 mm) pedunculated transverse colon polyp (prolapse type polyp) with adherent clot and stigmata of recent bleed.   COLONOSCOPY WITH PROPOFOL N/A 05/14/2021   Procedure: COLONOSCOPY WITH PROPOFOL;  Surgeon: Corbin Ade, MD;  Location: [facility];  Service: Endoscopy;  Laterality: N/A;   ESOPHAGOGASTRODUODENOSCOPY (EGD) WITH PROPOFOL N/A 04/24/2021   Surgeon: Lanelle Bal, DO;  duodenal erosions and gastritis biopsied (pathology with peptic duodenitis, reactive gastropathy with erosions/chronic inflammation, negative for H. pylori)   EYE SURGERY     Vatrectomy   HEMOSTASIS CLIP PLACEMENT  05/14/2021   Procedure: HEMOSTASIS CLIP PLACEMENT;  Surgeon: Corbin Ade, MD;  Location: [facility];  Service: Endoscopy;;   IR PERC TUN PERIT CATH WO PORT S&I /IMAG  09/15/2020   IR REMOVAL TUN CV CATH W/O FL  02/19/2021   IR US GUIDE VASC ACCESS RIGHT  09/15/2020   POLYPECTOMY  04/24/2021   Procedure: POLYPECTOMY;  Surgeon: Lanelle Bal, DO;  Location: [facility];  Service: Endoscopy;;   POLYPECTOMY  05/14/2021   Procedure: POLYPECTOMY;  Surgeon: Corbin Ade, MD;  Location: [facility];  Service: Endoscopy;;   SKIN SPLIT GRAFT Bilateral 09/03/2021   Procedure: SKIN GRAFT BILATERAL LEGS;  Surgeon: Nadara Mustard, MD;  Location[facility] OR;  Service: Orthopedics;  Laterality: Bilateral;   SKIN SPLIT GRAFT Left 12/10/2021    Procedure: IRRIGATION AND DEBRIDEMENT LEFT CALF, APPLICATION SPLIT THICKNESS SKIN GRAFT;  Surgeon: Nadara Mustard, MD;  Location: [facility] OR;  Service: Orthopedics;  Laterality: Left;   TOE SURGERY      Family History  Problem Relation Age of Onset   Heart failure Mother    Heart disease Mother    Diabetes Mother    Kidney disease Mother    Heart failure Father  Diabetes Father    Heart disease Father    Diabetes Brother    Heart failure Maternal Grandmother    Heart failure Maternal Grandfather    Transient ischemic attack Maternal Grandfather    Colon cancer Neg Hx     Social History   Socioeconomic History   Marital status: Single    Spouse name: Not on file   Number of children: Not on file   Years of education: Not on file   Highest education level: Not on file  Occupational History   Not on file  Tobacco Use   Smoking status: Never   Smokeless tobacco: Never  Vaping Use   Vaping Use: Never used  Substance and Sexual Activity   Alcohol use: No   Drug use: No   Sexual activity: Yes    Birth control/protection: Condom  Other Topics Concern   Not on file  Social History Narrative   Not on file   Social Determinants of Health   Financial Resource Strain: Not on file  Food Insecurity: No Food Insecurity (10/02/2022)   Hunger Vital Sign    Worried About Running Out of Food in the Last Year: Never true    Ran Out of Food in the Last Year: Never true  Transportation Needs: No Transportation Needs (10/02/2022)   PRAPARE - Administrator, Civil Service (Medical): No    Lack of Transportation (Non-Medical): No  Physical Activity: Not on file  Stress: Not on file  Social Connections: Not on file  Intimate Partner Violence: Not At Risk (10/02/2022)   Humiliation, Afraid, Rape, and Kick questionnaire    Fear of Current or Ex-Partner: No    Emotionally Abused: No    Physically Abused: No    Sexually Abused: No     Review of System:   General:  Negative for anorexia, weight loss, fever, chills, fatigue, weakness. Eyes: Negative for vision changes.  ENT: Negative for hoarseness, difficulty swallowing , nasal congestion. CV: Negative for chest pain, angina, palpitations, dyspnea on exertion, peripheral edema.  Respiratory: Negative for dyspnea at rest, dyspnea on exertion, cough, sputum, wheezing.  GI: See history of present illness. GU:  Negative for dysuria, hematuria, urinary incontinence, urinary frequency, nocturnal urination.  MS: Negative for joint pain, low back pain.  Derm: Negative for rash or itching.  Leg wounds, bilateral una boots. Neuro: Negative for weakness, abnormal sensation, seizure, frequent headaches, memory loss, confusion.  Psych: Negative for anxiety, depression, suicidal ideation, hallucinations.  Endo: Negative for unusual weight change.  Heme: Negative for bruising or bleeding. Allergy: Negative for rash or hives.      Physical Examination:   Vital signs in last 24 hours: Temp:  [97.9 F (36.6 C)-98.5 F (36.9 C)] 98.4 F (36.9 C) (04/25 0532) Pulse Rate:  [67-73] 73 (04/25 0532) Resp:  [18-20] 18 (04/25 0532) BP: (100-129)/(64-81) 103/64 (04/25 0532) SpO2:  [95 %-100 %] 100 % (04/25 0532) Weight:  [97.5 kg] 97.5 kg (04/24 1729) Last BM Date : 03/14/23  General: Well-nourished, well-developed in no acute distress.  Head: Normocephalic, atraumatic.   Eyes: Conjunctiva pink, no icterus. Mouth: Oropharyngeal mucosa moist and pink   Neck: Supple without thyromegaly, masses, or lymphadenopathy.  Lungs: Clear to auscultation bilaterally.  Heart: Regular rate and rhythm, no murmurs rubs or gallops.  Abdomen: Bowel sounds are normal, nontender, nondistended, no hepatosplenomegaly or masses, no abdominal bruits or hernia , no rebound or guarding.   Rectal: large hemorrhoid notes externally non-thrombosed, nonbleeding, slightly  tender. Small hemorrhoid at 3oclock with no active bleeding. Internal exam  not completed.  Extremities: bilaterally leg boots.  Neuro: Alert and oriented x 4 , grossly normal neurologically.  Skin: Warm and dry, no rash or jaundice.   Psych: Alert and cooperative, normal mood and affect.        Intake/Output from previous day: No intake/output data recorded. Intake/Output this shift: No intake/output data recorded.  Lab Results:   CBC Recent Labs    03/15/23 1850 03/16/23 0455  WBC 7.0 6.2  HGB 11.6* 10.8*  HCT 37.6 35.3*  MCV 95.9 95.4  PLT 103* 100*   BMET Recent Labs    03/15/23 1850 03/16/23 0455  NA 133* 133*  K 4.8 4.6  CL 99 100  CO2 23 22  GLUCOSE 187* 157*  BUN 48* 51*  CREATININE 7.13* 7.45*  CALCIUM 8.4* 8.3*   LFT Recent Labs    03/15/23 1850  BILITOT 0.7  ALKPHOS 144*  AST 15  ALT 16  PROT 7.1  ALBUMIN 4.0    Lipase No results for input(s): "LIPASE" in the last 72 hours.  PT/INR Recent Labs    03/15/23 1850  LABPROT 14.2  INR 1.1     Hepatitis Panel No results for input(s): "HEPBSAG", "HCVAB", "HEPAIGM", "HEPBIGM" in the last 72 hours.   Imaging Studies:   No results found.Pierre.Alas week]  Assessment:   52 year old female with multiple medical issues including chronic diastolic CHF, diabetes, dyslipidemia, hypertension,-wheezing, end-stage renal disease on 6, pulmonary hypertension, history of proctitis, history of post polypectomy bleeding who presented to the emergency department yesterday with complaints of fresh blood per rectum.  Rectal bleeding: Patient with history of CT evidence of proctitis on imaging in November 2023 during her last hospitalization for rectal bleeding.  Plans for colonoscopy at that time but patient ate breakfast and it was decided to discharge her and consider outpatient colonoscopy.  Patient was lost to follow-up (did not keep appt).  Presenting now with recurrent rectal bleeding in the setting of known hemorrhoid disease.  She had 2 colonoscopies in June 2022, the second 1 for post  polypectomy bleeding.  Hemoglobin remains around her baseline.  This morning past significant sized blood clot, subsequently has passed normal soft stool.  Etiology of her bleeding is still unclear.  Could be hemorrhoidal, proctitis is also a possibility.  Thrombocytopenia: Dating back at least to June 2023.  CT imaging last year with hepatomegaly, normal spleen.  EGD 2022 with no signs of advanced liver disease, i.e. portal hypertension.  Plan:   May require colonoscopy or flex sigmoidoscopy to further evaluate her bleeding. If bleeding related to hemorhroid disease, she would likely benefit from definitive surgical intervention.  Continue to trend H/H.   LOS: 0 days   We would like to thank you for the opportunity to participate in the care of Amy Moses.  Leanna Battles. Dixon Boos [facility] [phone] 4/25/20249:03 AM

## 2023-03-16 NOTE — Consult Note (Signed)
Reason for Consult: To manage dialysis and dialysis related needs Referring Physician: Dr Amy Moses is an 52 y.o. female.   HPI: Pt is a [name] with a PMH sig for HTN, HLD, DM II, PAD, ESRD on HD TTS, and calciphylaxis who is now seen in consultation at the request of Amy. Amy Moses for provision of dialysis and management of ESRD.    Pt was admitted to [facility] overnight for BRBPR of one day's duration.  Had same issue in Nov 2023, related to proctitis.  Yesterday noticed blood in the toilet when wiping and dripping into the toilet.  Presented here for eval.    Hgb was 11.6, last night., 10.8 this AM.    Denies CP, SOB, dizziness.  BP isn't terribly low.  Takes dialysis TTS at [facility], no missed rx.    Has a history of extensive calciphylaxis wounds which are healing.  Wrapped in unna boots.    In this setting we are asked to see.   Dialyzes at [facility] TTS EDW 93 kg 3 hr 45 min L AVF  Past Medical History:  Diagnosis Date   Anemia    Blindness of right eye with low vision in contralateral eye    s/p victrectomy   Chronic diastolic heart failure 03/30/2021   Diabetes mellitus, type II    Dyslipidemia    Glaucoma    History of blood transfusion    Hypertension    Hypothyroidism (acquired)    Kidney disease    Stage 5   Mitral regurgitation 11/16/2022   Pneumonia    Pulmonary hypertension, unspecified 09/12/2022    Past Surgical History:  Procedure Laterality Date   ABDOMINAL AORTOGRAM W/LOWER EXTREMITY Bilateral 12/18/2020   Procedure: ABDOMINAL AORTOGRAM W/LOWER EXTREMITY;  Surgeon: Sherren Kerns, MD;  Location: [facility];  Service: Cardiovascular;  Laterality: Bilateral;   ABDOMINAL AORTOGRAM W/LOWER EXTREMITY Bilateral 01/25/2022   Procedure: ABDOMINAL AORTOGRAM W/LOWER EXTREMITY;  Surgeon: Nada Libman, MD;  Location: [facility];  Service: Cardiovascular;  Laterality: Bilateral;   AMPUTATION Right 02/16/2022    Procedure: RIGHT FOOT 5TH RAY AMPUTATION;  Surgeon: Nadara Mustard, MD;  Location: [facility] OR;  Service: Orthopedics;  Laterality: Right;   ANKLE FRACTURE SURGERY Right    AV FISTULA PLACEMENT Left 08/18/2020   Procedure: LEFT ARM BRACHIOCEPHALIC ARTERIOVENOUS (AV) FISTULA CREATION;  Surgeon: Sherren Kerns, MD;  Location: [facility] OR;  Service: Vascular;  Laterality: Left;   BIOPSY  04/24/2021   Procedure: BIOPSY;  Surgeon: Lanelle Bal, DO;  [facility];  Service: Endoscopy;;   CESAREAN SECTION     CHOLECYSTECTOMY     COLONOSCOPY  04/24/2021   Surgeon: Lanelle Bal, DO;  nonbleeding internal hemorrhoids, 1 large (25 mm) pedunculated transverse colon polyp (prolapse type polyp) with adherent clot and stigmata of recent bleed.   COLONOSCOPY WITH PROPOFOL N/A 05/14/2021   Procedure: COLONOSCOPY WITH PROPOFOL;  Surgeon: Corbin Ade, MD;  [facility];  Service: Endoscopy;  Laterality: N/A;   ESOPHAGOGASTRODUODENOSCOPY (EGD) WITH PROPOFOL N/A 04/24/2021   Surgeon: Lanelle Bal, DO;  duodenal erosions and gastritis biopsied (pathology with peptic duodenitis, reactive gastropathy with erosions/chronic inflammation, negative for H. pylori)   EYE SURGERY     Vatrectomy   HEMOSTASIS CLIP PLACEMENT  05/14/2021   Procedure: HEMOSTASIS CLIP PLACEMENT;  Surgeon: Corbin Ade, MD;  [facility];  Service: Endoscopy;;   IR PERC TUN PERIT CATH  WO PORT S&I /IMAG  09/15/2020   IR REMOVAL TUN CV CATH W/O FL  02/19/2021   IR US GUIDE VASC ACCESS RIGHT  09/15/2020   POLYPECTOMY  04/24/2021   Procedure: POLYPECTOMY;  Surgeon: Lanelle Bal, DO;  [facility];  Service: Endoscopy;;   POLYPECTOMY  05/14/2021   Procedure: POLYPECTOMY;  Surgeon: Corbin Ade, MD;  [facility];  Service: Endoscopy;;   SKIN SPLIT GRAFT Bilateral 09/03/2021   Procedure: SKIN GRAFT BILATERAL LEGS;  Surgeon: Nadara Mustard, MD;  Location: [facility] OR;  Service:  Orthopedics;  Laterality: Bilateral;   SKIN SPLIT GRAFT Left 12/10/2021   Procedure: IRRIGATION AND DEBRIDEMENT LEFT CALF, APPLICATION SPLIT THICKNESS SKIN GRAFT;  Surgeon: Nadara Mustard, MD;  Location: [facility] OR;  Service: Orthopedics;  Laterality: Left;   TOE SURGERY      Family History  Problem Relation Age of Onset   Heart failure Mother    Heart disease Mother    Diabetes Mother    Kidney disease Mother    Heart failure Father    Diabetes Father    Heart disease Father    Diabetes Brother    Heart failure Maternal Grandmother    Heart failure Maternal Grandfather    Transient ischemic attack Maternal Grandfather    Colon cancer Neg Hx     Social History:  reports that she has never smoked. She has never used smokeless tobacco. She reports that she does not drink alcohol and does not use drugs.  Allergies:  Allergies  Allergen Reactions   Ace Inhibitors Cough    Medications: Scheduled:  atorvastatin  10 mg Oral Daily   Chlorhexidine Gluconate Cloth  6 each Topical Q0600   ferric citrate  630 mg Oral BID WC   insulin aspart  0-5 Units Subcutaneous QHS   insulin aspart  0-9 Units Subcutaneous TID WC   insulin glargine-yfgn  5 Units Subcutaneous QHS   levothyroxine  75 mcg Oral Daily   midodrine  10 mg Oral Daily   pantoprazole  40 mg Oral BID   polyethylene glycol  17 g Oral Daily    Results for orders placed or performed during the hospital encounter of 03/15/23 (from the past 48 hour(s))  Comprehensive metabolic panel     Status: Abnormal   Collection Time: 03/15/23  6:50 PM  Result Value Ref Range   Sodium 133 (L) 135 - 145 mmol/L   Potassium 4.8 3.5 - 5.1 mmol/L   Chloride 99 98 - 111 mmol/L   CO2 23 22 - 32 mmol/L   Glucose, Bld 187 (H) 70 - 99 mg/dL    Comment: Glucose reference range applies only to samples taken after fasting for at least 8 hours.   BUN 48 (H) 6 - 20 mg/dL   Creatinine, Ser 4.09 (H) 0.44 - 1.00 mg/dL   Calcium 8.4 (L) 8.9 - 10.3 mg/dL    Total Protein 7.1 6.5 - 8.1 g/dL   Albumin 4.0 3.5 - 5.0 g/dL   AST 15 15 - 41 U/L   ALT 16 0 - 44 U/L   Alkaline Phosphatase 144 (H) 38 - 126 U/L   Total Bilirubin 0.7 0.3 - 1.2 mg/dL   GFR, Estimated 7 (L) >60 mL/min    Comment: (NOTE) Calculated using the CKD-EPI Creatinine Equation (2021)    Anion gap 11 5 - 15    Comment: Performed at [name], [address] [place], [place] [address]  CBC with  Differential     Status: Abnormal   Collection Time: 03/15/23  6:50 PM  Result Value Ref Range   WBC 7.0 4.0 - 10.5 K/uL   RBC 3.92 3.87 - 5.11 MIL/uL   Hemoglobin 11.6 (L) 12.0 - 15.0 g/dL   HCT 29.5 28.4 - 13.2 %   MCV 95.9 80.0 - 100.0 fL   MCH 29.6 26.0 - 34.0 pg   MCHC 30.9 30.0 - 36.0 g/dL   RDW 44.0 10.2 - 72.5 %   Platelets 103 (L) 150 - 400 K/uL    Comment: SPECIMEN CHECKED FOR CLOTS Immature Platelet Fraction may be clinically indicated, consider ordering this additional test [id number] REPEATED TO VERIFY    nRBC 0.0 0.0 - 0.2 %   Neutrophils Relative % 74 %   Neutro Abs 5.1 1.7 - 7.7 K/uL   Lymphocytes Relative 14 %   Lymphs Abs 1.0 0.7 - 4.0 K/uL   Monocytes Relative 8 %   Monocytes Absolute 0.6 0.1 - 1.0 K/uL   Eosinophils Relative 3 %   Eosinophils Absolute 0.2 0.0 - 0.5 K/uL   Basophils Relative 1 %   Basophils Absolute 0.0 0.0 - 0.1 K/uL   Immature Granulocytes 0 %   Abs Immature Granulocytes 0.02 0.00 - 0.07 K/uL    Comment: Performed at [name], [address] [place], [place] [address]  Protime-INR     Status: None   Collection Time: 03/15/23  6:50 PM  Result Value Ref Range   Prothrombin Time 14.2 11.4 - 15.2 seconds   INR 1.1 0.8 - 1.2    Comment: (NOTE) INR goal varies based on device and disease states. Performed at [facility], [address] [place], [place] [address]   Type and screen [facility]     Status: None   Collection Time: 03/15/23  6:50 PM  Result Value Ref Range   ABO/RH(D) O POS    Antibody Screen NEG     Sample Expiration      03/18/2023,2359 Performed at [facility], [address] [place], [place] [address]   Glucose, capillary     Status: Abnormal   Collection Time: 03/15/23 11:31 PM  Result Value Ref Range   Glucose-Capillary 174 (H) 70 - 99 mg/dL    Comment: Glucose reference range applies only to samples taken after fasting for at least 8 hours.  Basic metabolic panel     Status: Abnormal   Collection Time: 03/16/23  4:55 AM  Result Value Ref Range   Sodium 133 (L) 135 - 145 mmol/L   Potassium 4.6 3.5 - 5.1 mmol/L   Chloride 100 98 - 111 mmol/L   CO2 22 22 - 32 mmol/L   Glucose, Bld 157 (H) 70 - 99 mg/dL    Comment: Glucose reference range applies only to samples taken after fasting for at least 8 hours.   BUN 51 (H) 6 - 20 mg/dL   Creatinine, Ser 2.95 (H) 0.44 - 1.00 mg/dL   Calcium 8.3 (L) 8.9 - 10.3 mg/dL   GFR, Estimated 6 (L) >60 mL/min    Comment: (NOTE) Calculated using the CKD-EPI Creatinine Equation (2021)    Anion gap 11 5 - 15    Comment: Performed at [facility], [address] [place], [place] [address]  CBC     Status: Abnormal   Collection Time: 03/16/23  4:55 AM  Result Value Ref Range   WBC 6.2 4.0 - 10.5 K/uL   RBC 3.70 (L) 3.87 -  5.11 MIL/uL   Hemoglobin 10.8 (L) 12.0 - 15.0 g/dL   HCT 16.1 (L) 09.6 - 04.5 %   MCV 95.4 80.0 - 100.0 fL   MCH 29.2 26.0 - 34.0 pg   MCHC 30.6 30.0 - 36.0 g/dL   RDW 40.9 81.1 - 91.4 %   Platelets 100 (L) 150 - 400 K/uL   nRBC 0.0 0.0 - 0.2 %    Comment: Performed at [facility], [address] [place], [place] [address]  Glucose, capillary     Status: Abnormal   Collection Time: 03/16/23  8:01 AM  Result Value Ref Range   Glucose-Capillary 147 (H) 70 - 99 mg/dL    Comment: Glucose reference range applies only to samples taken after fasting for at least 8 hours.    No results found.  ROS: all other systems reviewed and are negative except as per HPI  Blood pressure 103/64, pulse 73, temperature 98.4 F (36.9  C), resp. rate 18, height  (1.676 m), weight 97.5 kg, last menstrual period 08/22/2015, SpO2 100 %. GEN NAD, sitting in bed HEENT R eye blind, L eye EOMI PERRL NECK no JVd PULM clear CV RRR ABD soft EXT 3+ edema, healed calciphylaxis wounds on the thigh, UNNA boots in place NEURO AAO x 3 nonfocal ACCESS: L AVF +T/B  Assessment/Plan: 1 BRBPR: h/o proctitis and hemorrhoids.  Hgb 11.6-->10.8.  Per primary, anticipate GI c/s too.   2 ESRD:  TTS- hasn't missed any HD sessions. HD on schedule today.  UF as tolerated, no heparin in HD 3 Hypertension/ vol: UF as tolearted 4. Anemia of ESRD: monitor Hgb, ESA as appropriate 5. Metabolic Bone Disease:  no Ca/ vit D as h/o calciphylaxis.  Amy Moses is binder- consider switching to renvela d/t calciphylaxis but this would have to be OP decision  6.  Nutrition: renal idet Dispo: admitted  Amy Moses 03/16/2023, 9:59 AM

## 2023-03-16 NOTE — Progress Notes (Signed)
Pt just went to the restroom. And pt passed a bright red blood clot the size of my palm. Notified, MD, GI, charge nurse.

## 2023-03-16 NOTE — H&P (View-Only) (Signed)
  Gastroenterology Consult   Referring Provider: No ref. provider found Primary Care Physician:  Mahoney, Mark, DO Primary Gastroenterologist: Charles K. Carver, DO   Patient ID: Amy Moses; [id number]; 10/02/1971   Admit date: 03/15/2023  LOS: 0 days   Date of Consultation: 03/16/2023  Reason for Consultation:  BRBPR    History of Present Illness   Amy Moses is a 51 y.o. female  with medical history significant of blindness of the right eye, chronic diastolic CHF, diabetes mellitus type 2, dyslipidemia, hypertension, hypothyroidism, ESRD on HD, pulmonary hypertension, with admission in November 2023 due to bright red blood per rectum related to proctitis, presents to the ED with bright red blood per rectum that started day of admission.  Patient reports intermittent constipation at baseline.  Generally will take MiraLAX daily as needed.  Last week she had a bowel movement almost every day except for maybe once.  Denies any rectal pain or abdominal pain.  Because she is wheelchair-bound, she often sits on the toilet while she is taking a bath.  Yesterday she had a bowel movement and noted fresh blood per rectum with wiping.  Also appeared to have some small clots in the toilet.  She showed me a picture.  While sitting on the toilet bathing she could hear dripping into the toilet.  She noted fresh blood dripping.  Continue to have bleeding into her underwear after getting dressed.  She denies any straining for bowel movement.  She takes heparin during dialysis.  No NSAIDs or aspirin use.  No heartburn, vomiting.  In the ED on rectal exam noted to have multiple external hemorrhoids, 1 small hemorrhoid around 3:00 with fresh scab. Sodium 133, creatinine 7.13, alkaline phosphatase 144, hemoglobin 11.6 (hemoglobin 10.21 September 2022), platelets 103,000.  Today her hemoglobin is 10.8, sodium 136, potassium 4.6.  Around 730 this morning patient was noted to pass a large bright red blood  clot the size of palm of the hand.  During my evaluation of the patient she had another stool, loose dark green with no visible blood.  External examination of the anorectal area, 2 large dressings noted anteriorly for wound coverage.  Large hemorrhoid noted externally with no active bleeding.  EGD June 2022: -Gastritis status post biopsy, reactive gastropathy with erosions and chronic inflammation.  No H. pylori -Duodenal erosions without bleeding status post biopsy, Peptic duodenitis  Colonoscopy April 24 2021: -Skin tags found on perianal exam -Nonbleeding internal hemorrhoids -one 5 mm polyp in the transverse colon removed, prolapse type polyp  Colonoscopy May 14, 2021: -one 5 mm polyp in the proximal rectum removed, mucosal prolapse polyp -Suspected post polypectomy bleed at site of proximal transverse/hepatic flexure status post 3 clips placed. -Repeat colonoscopy in 3 years   Prior to Admission medications   Medication Sig Start Date End Date Taking? Authorizing Provider  acetaminophen (TYLENOL) 500 MG tablet Take 1,000 mg by mouth every 6 (six) hours as needed for moderate pain.   Yes [provider]  atorvastatin (LIPITOR) 10 MG tablet Take 1 tablet (10 mg total) by mouth daily. 04/25/21  Yes Emokpae, Courage, MD  AURYXIA 1 GM 210 MG(Fe) tablet Take 630 mg by mouth 2 (two) times daily with a meal. 01/07/22  Yes [provider]  HUMALOG KWIKPEN 100 UNIT/ML KwikPen Inject 5-11 Units into the skin 3 (three) times daily with meals. If eats 50% or more of meal. Patient taking differently: Inject 5-11 Units into the skin 3 (three) times daily with meals.   If eats 50% or more of meal. Sliding scale 03/16/22  Yes Nida, Gebreselassie W, MD  insulin glargine (LANTUS) 100 UNIT/ML injection Inject 0.2 mLs (20 Units total) into the skin at bedtime. Patient taking differently: Inject 8 Units into the skin at bedtime. 04/06/22  Yes Nida, Gebreselassie W, MD  levothyroxine (SYNTHROID) 75  MCG tablet Take 75 mcg by mouth daily. 06/13/22  Yes [provider]  midodrine (PROAMATINE) 10 MG tablet Take 10 mg by mouth daily.   Yes [provider]  pantoprazole (PROTONIX) 40 MG tablet Take 1 tablet (40 mg total) by mouth 2 (two) times daily. 04/25/21  Yes Emokpae, Courage, MD  polyethylene glycol (MIRALAX / GLYCOLAX) 17 g packet Take 17 g by mouth daily. 10/02/22  Yes [place], Carlos, MD  Vitamin D, Ergocalciferol, (DRISDOL) 1.25 MG (50000 UNIT) CAPS capsule Take 50,000 Units by mouth every 7 (seven) days. Friday 04/20/21  Yes [provider]  Blood Glucose Monitoring Suppl (ACCU-CHEK GUIDE ME) w/Device KIT 1 Piece by Does not apply route as directed. 03/16/22   Nida, Gebreselassie W, MD  Blood Pressure Monitor KIT TAKE BLOOD PRESSURE DAILY 09/15/22   [place], Tiffany, MD  Continuous Blood Gluc Receiver (FREESTYLE LIBRE 2 READER) DEVI As directed 04/06/22   Nida, Gebreselassie W, MD  Continuous Blood Gluc Sensor (FREESTYLE LIBRE 2 SENSOR) MISC 1 Piece by Does not apply route every 14 (fourteen) days. 04/06/22   Nida, Gebreselassie W, MD  glucose blood (ACCU-CHEK GUIDE) test strip Use as instructed 03/16/22   Nida, Gebreselassie W, MD  midodrine (PROAMATINE) 5 MG tablet Take 10 mg by mouth See admin instructions. Take 1 hour prior to dialysis Patient not taking: Reported on 03/15/2023 09/27/22   [provider]    Current Facility-Administered Medications  Medication Dose Route Frequency Provider Last Rate Last Admin   acetaminophen (TYLENOL) tablet 650 mg  650 mg Oral Q6H PRN Elgergawy, Dawood S, MD       Or   acetaminophen (TYLENOL) suppository 650 mg  650 mg Rectal Q6H PRN Elgergawy, Dawood S, MD       atorvastatin (LIPITOR) tablet 10 mg  10 mg Oral Daily Elgergawy, Dawood S, MD       ferric citrate (AURYXIA) tablet 630 mg  630 mg Oral BID WC Elgergawy, Dawood S, MD       hydrALAZINE (APRESOLINE) injection 5 mg  5 mg Intravenous Q4H PRN Elgergawy, Dawood S,  MD       insulin aspart (novoLOG) injection 0-5 Units  0-5 Units Subcutaneous QHS Elgergawy, Dawood S, MD       insulin aspart (novoLOG) injection 0-9 Units  0-9 Units Subcutaneous TID WC Elgergawy, Dawood S, MD       insulin glargine-yfgn (SEMGLEE) injection 5 Units  5 Units Subcutaneous QHS Elgergawy, Dawood S, MD   5 Units at 03/15/23 2215   levothyroxine (SYNTHROID) tablet 75 mcg  75 mcg Oral Daily Elgergawy, Dawood S, MD   75 mcg at 03/16/23 0534   midodrine (PROAMATINE) tablet 10 mg  10 mg Oral Daily Elgergawy, Dawood S, MD       pantoprazole (PROTONIX) EC tablet 40 mg  40 mg Oral BID Elgergawy, Dawood S, MD   40 mg at 03/15/23 2215   polyethylene glycol (MIRALAX / GLYCOLAX) packet 17 g  17 g Oral Daily Elgergawy, Dawood S, MD        Allergies as of 03/15/2023 - Review Complete 03/15/2023  Allergen Reaction Noted   Ace inhibitors Cough   11/19/2020    Past Medical History:  Diagnosis Date   Anemia    Blindness of right eye with low vision in contralateral eye    s/p victrectomy   Chronic diastolic heart failure 03/30/2021   Diabetes mellitus, type II    Dyslipidemia    Glaucoma    History of blood transfusion    Hypertension    Hypothyroidism (acquired)    Kidney disease    Stage 5   Mitral regurgitation 11/16/2022   Pneumonia    Pulmonary hypertension, unspecified 09/12/2022    Past Surgical History:  Procedure Laterality Date   ABDOMINAL AORTOGRAM W/LOWER EXTREMITY Bilateral 12/18/2020   Procedure: ABDOMINAL AORTOGRAM W/LOWER EXTREMITY;  Surgeon: Fields, Charles E, MD;  Location: [facility];  Service: Cardiovascular;  Laterality: Bilateral;   ABDOMINAL AORTOGRAM W/LOWER EXTREMITY Bilateral 01/25/2022   Procedure: ABDOMINAL AORTOGRAM W/LOWER EXTREMITY;  Surgeon: Brabham, Vance W, MD;  Location: [facility];  Service: Cardiovascular;  Laterality: Bilateral;   AMPUTATION Right 02/16/2022   Procedure: RIGHT FOOT 5TH RAY AMPUTATION;  Surgeon: Duda, Marcus V, MD;   Location: [facility];  Service: Orthopedics;  Laterality: Right;   ANKLE FRACTURE SURGERY Right    AV FISTULA PLACEMENT Left 08/18/2020   Procedure: LEFT ARM BRACHIOCEPHALIC ARTERIOVENOUS (AV) FISTULA CREATION;  Surgeon: Fields, Charles E, MD;  Location: [facility];  Service: Vascular;  Laterality: Left;   BIOPSY  04/24/2021   Procedure: BIOPSY;  Surgeon: Carver, Charles K, DO;  Location: [facility];  Service: Endoscopy;;   CESAREAN SECTION     CHOLECYSTECTOMY     COLONOSCOPY  04/24/2021   Surgeon: Carver, Charles K, DO;  nonbleeding internal hemorrhoids, 1 large (25 mm) pedunculated transverse colon polyp (prolapse type polyp) with adherent clot and stigmata of recent bleed.   COLONOSCOPY WITH PROPOFOL N/A 05/14/2021   Procedure: COLONOSCOPY WITH PROPOFOL;  Surgeon: Rourk, Robert M, MD;  Location: [facility];  Service: Endoscopy;  Laterality: N/A;   ESOPHAGOGASTRODUODENOSCOPY (EGD) WITH PROPOFOL N/A 04/24/2021   Surgeon: Carver, Charles K, DO;  duodenal erosions and gastritis biopsied (pathology with peptic duodenitis, reactive gastropathy with erosions/chronic inflammation, negative for H. pylori)   EYE SURGERY     Vatrectomy   HEMOSTASIS CLIP PLACEMENT  05/14/2021   Procedure: HEMOSTASIS CLIP PLACEMENT;  Surgeon: Rourk, Robert M, MD;  [facility]: [facility];  Service: Endoscopy;;   IR PERC TUN PERIT CATH WO PORT S&I /IMAG  09/15/2020   IR REMOVAL TUN CV CATH W/O FL  02/19/2021   IR US GUIDE VASC ACCESS RIGHT  09/15/2020   POLYPECTOMY  04/24/2021   Procedure: POLYPECTOMY;  Surgeon: Carver, Charles K, DO;  [facility];  Service: Endoscopy;;   POLYPECTOMY  05/14/2021   Procedure: POLYPECTOMY;  Surgeon: Rourk, Robert M, MD;  [facility]: [facility];  Service: Endoscopy;;   SKIN SPLIT GRAFT Bilateral 09/03/2021   Procedure: SKIN GRAFT BILATERAL LEGS;  Surgeon: Duda, Marcus V, MD;  Location: [facility];  Service: Orthopedics;  Laterality: Bilateral;   SKIN SPLIT GRAFT Left 12/10/2021    Procedure: IRRIGATION AND DEBRIDEMENT LEFT CALF, APPLICATION SPLIT THICKNESS SKIN GRAFT;  Surgeon: Duda, Marcus V, MD;  Location: [facility];  Service: Orthopedics;  Laterality: Left;   TOE SURGERY      Family History  Problem Relation Age of Onset   Heart failure Mother    Heart disease Mother    Diabetes Mother    Kidney disease Mother    Heart failure Father      Diabetes Father    Heart disease Father    Diabetes Brother    Heart failure Maternal Grandmother    Heart failure Maternal Grandfather    Transient ischemic attack Maternal Grandfather    Colon cancer Neg Hx     Social History   Socioeconomic History   Marital status: Single    Spouse name: Not on file   Number of children: Not on file   Years of education: Not on file   Highest education level: Not on file  Occupational History   Not on file  Tobacco Use   Smoking status: Never   Smokeless tobacco: Never  Vaping Use   Vaping Use: Never used  Substance and Sexual Activity   Alcohol use: No   Drug use: No   Sexual activity: Yes    Birth control/protection: Condom  Other Topics Concern   Not on file  Social History Narrative   Not on file   Social Determinants of Health   Financial Resource Strain: Not on file  Food Insecurity: No Food Insecurity (10/02/2022)   Hunger Vital Sign    Worried About Running Out of Food in the Last Year: Never true    Ran Out of Food in the Last Year: Never true  Transportation Needs: No Transportation Needs (10/02/2022)   PRAPARE - Transportation    Lack of Transportation (Medical): No    Lack of Transportation (Non-Medical): No  Physical Activity: Not on file  Stress: Not on file  Social Connections: Not on file  Intimate Partner Violence: Not At Risk (10/02/2022)   Humiliation, Afraid, Rape, and Kick questionnaire    Fear of Current or Ex-Partner: No    Emotionally Abused: No    Physically Abused: No    Sexually Abused: No     Review of System:   General:  Negative for anorexia, weight loss, fever, chills, fatigue, weakness. Eyes: Negative for vision changes.  ENT: Negative for hoarseness, difficulty swallowing , nasal congestion. CV: Negative for chest pain, angina, palpitations, dyspnea on exertion, peripheral edema.  Respiratory: Negative for dyspnea at rest, dyspnea on exertion, cough, sputum, wheezing.  GI: See history of present illness. GU:  Negative for dysuria, hematuria, urinary incontinence, urinary frequency, nocturnal urination.  MS: Negative for joint pain, low back pain.  Derm: Negative for rash or itching.  Leg wounds, bilateral una boots. Neuro: Negative for weakness, abnormal sensation, seizure, frequent headaches, memory loss, confusion.  Psych: Negative for anxiety, depression, suicidal ideation, hallucinations.  Endo: Negative for unusual weight change.  Heme: Negative for bruising or bleeding. Allergy: Negative for rash or hives.      Physical Examination:   Vital signs in last 24 hours: Temp:  [97.9 F (36.6 C)-98.5 F (36.9 C)] 98.4 F (36.9 C) (04/25 0532) Pulse Rate:  [67-73] 73 (04/25 0532) Resp:  [18-20] 18 (04/25 0532) BP: (100-129)/(64-81) 103/64 (04/25 0532) SpO2:  [95 %-100 %] 100 % (04/25 0532) Weight:  [97.5 kg] 97.5 kg (04/24 1729) Last BM Date : 03/14/23  General: Well-nourished, well-developed in no acute distress.  Head: Normocephalic, atraumatic.   Eyes: Conjunctiva pink, no icterus. Mouth: Oropharyngeal mucosa moist and pink   Neck: Supple without thyromegaly, masses, or lymphadenopathy.  Lungs: Clear to auscultation bilaterally.  Heart: Regular rate and rhythm, no murmurs rubs or gallops.  Abdomen: Bowel sounds are normal, nontender, nondistended, no hepatosplenomegaly or masses, no abdominal bruits or hernia , no rebound or guarding.   Rectal: large hemorrhoid notes externally non-thrombosed, nonbleeding, slightly   tender. Small hemorrhoid at 3oclock with no active bleeding. Internal exam  not completed.  Extremities: bilaterally leg boots.  Neuro: Alert and oriented x 4 , grossly normal neurologically.  Skin: Warm and dry, no rash or jaundice.   Psych: Alert and cooperative, normal mood and affect.        Intake/Output from previous day: No intake/output data recorded. Intake/Output this shift: No intake/output data recorded.  Lab Results:   CBC Recent Labs    03/15/23 1850 03/16/23 0455  WBC 7.0 6.2  HGB 11.6* 10.8*  HCT 37.6 35.3*  MCV 95.9 95.4  PLT 103* 100*   BMET Recent Labs    03/15/23 1850 03/16/23 0455  NA 133* 133*  K 4.8 4.6  CL 99 100  CO2 23 22  GLUCOSE 187* 157*  BUN 48* 51*  CREATININE 7.13* 7.45*  CALCIUM 8.4* 8.3*   LFT Recent Labs    03/15/23 1850  BILITOT 0.7  ALKPHOS 144*  AST 15  ALT 16  PROT 7.1  ALBUMIN 4.0    Lipase No results for input(s): "LIPASE" in the last 72 hours.  PT/INR Recent Labs    03/15/23 1850  LABPROT 14.2  INR 1.1     Hepatitis Panel No results for input(s): "HEPBSAG", "HCVAB", "HEPAIGM", "HEPBIGM" in the last 72 hours.   Imaging Studies:   No results found.[4 week]  Assessment:   51-year-old female with multiple medical issues including chronic diastolic CHF, diabetes, dyslipidemia, hypertension,-wheezing, end-stage renal disease on 6, pulmonary hypertension, history of proctitis, history of post polypectomy bleeding who presented to the emergency department yesterday with complaints of fresh blood per rectum.  Rectal bleeding: Patient with history of CT evidence of proctitis on imaging in November 2023 during her last hospitalization for rectal bleeding.  Plans for colonoscopy at that time but patient ate breakfast and it was decided to discharge her and consider outpatient colonoscopy.  Patient was lost to follow-up (did not keep appt).  Presenting now with recurrent rectal bleeding in the setting of known hemorrhoid disease.  She had 2 colonoscopies in June 2022, the second 1 for post  polypectomy bleeding.  Hemoglobin remains around her baseline.  This morning past significant sized blood clot, subsequently has passed normal soft stool.  Etiology of her bleeding is still unclear.  Could be hemorrhoidal, proctitis is also a possibility.  Thrombocytopenia: Dating back at least to June 2023.  CT imaging last year with hepatomegaly, normal spleen.  EGD 2022 with no signs of advanced liver disease, i.e. portal hypertension.  Plan:   May require colonoscopy or flex sigmoidoscopy to further evaluate her bleeding. If bleeding related to hemorhroid disease, she would likely benefit from definitive surgical intervention.  Continue to trend H/H.   LOS: 0 days   We would like to thank you for the opportunity to participate in the care of Amy Moses.  Katie Moch S. Angle Dirusso, PA-C [facility] [phone] 4/25/20249:03 AM   

## 2023-03-17 ENCOUNTER — Observation Stay (HOSPITAL_COMMUNITY): Payer: Medicare Other | Admitting: Certified Registered"

## 2023-03-17 ENCOUNTER — Encounter (HOSPITAL_COMMUNITY): Payer: Self-pay | Admitting: Internal Medicine

## 2023-03-17 ENCOUNTER — Observation Stay (HOSPITAL_BASED_OUTPATIENT_CLINIC_OR_DEPARTMENT_OTHER): Payer: Medicare Other | Admitting: Certified Registered"

## 2023-03-17 ENCOUNTER — Encounter (HOSPITAL_COMMUNITY)
Admission: EM | Disposition: A | Discharge status: Home / Self Care | Payer: Self-pay | Source: Home / Self Care | Attending: Emergency Medicine

## 2023-03-17 DIAGNOSIS — K642 Third degree hemorrhoids: Secondary | ICD-10-CM | POA: Diagnosis not present

## 2023-03-17 DIAGNOSIS — D638 Anemia in other chronic diseases classified elsewhere: Secondary | ICD-10-CM

## 2023-03-17 DIAGNOSIS — D122 Benign neoplasm of ascending colon: Secondary | ICD-10-CM

## 2023-03-17 DIAGNOSIS — E039 Hypothyroidism, unspecified: Secondary | ICD-10-CM | POA: Diagnosis not present

## 2023-03-17 DIAGNOSIS — K51418 Inflammatory polyps of colon with other complication: Secondary | ICD-10-CM

## 2023-03-17 DIAGNOSIS — K921 Melena: Secondary | ICD-10-CM

## 2023-03-17 DIAGNOSIS — K625 Hemorrhage of anus and rectum: Secondary | ICD-10-CM | POA: Diagnosis not present

## 2023-03-17 HISTORY — PX: COLONOSCOPY WITH PROPOFOL: SHX5780

## 2023-03-17 HISTORY — PX: POLYPECTOMY: SHX5525

## 2023-03-17 LAB — BASIC METABOLIC PANEL
Anion gap: 11 (ref 5–15)
BUN: 34 mg/dL — ABNORMAL HIGH (ref 6–20)
CO2: 25 mmol/L (ref 22–32)
Calcium: 8.3 mg/dL — ABNORMAL LOW (ref 8.9–10.3)
Chloride: 93 mmol/L — ABNORMAL LOW (ref 98–111)
Creatinine, Ser: 5.86 mg/dL — ABNORMAL HIGH (ref 0.44–1.00)
GFR, Estimated: 8 mL/min — ABNORMAL LOW (ref >60)
Glucose, Bld: 107 mg/dL — ABNORMAL HIGH (ref 70–99)
Potassium: 4 mmol/L (ref 3.5–5.1)
Sodium: 129 mmol/L — ABNORMAL LOW (ref 135–145)

## 2023-03-17 LAB — CBC
HCT: 34 % — ABNORMAL LOW (ref 36.0–46.0)
Hemoglobin: 10.6 g/dL — ABNORMAL LOW (ref 12.0–15.0)
MCH: 29.2 pg (ref 26.0–34.0)
MCHC: 31.2 g/dL (ref 30.0–36.0)
MCV: 93.7 fL (ref 80.0–100.0)
Platelets: 108 10*3/uL — ABNORMAL LOW (ref 150–400)
RBC: 3.63 MIL/uL — ABNORMAL LOW (ref 3.87–5.11)
RDW: 14.6 % (ref 11.5–15.5)
WBC: 6.2 10*3/uL (ref 4.0–10.5)
nRBC: 0 % (ref 0.0–0.2)

## 2023-03-17 LAB — GLUCOSE, CAPILLARY
Glucose-Capillary: 110 mg/dL — ABNORMAL HIGH (ref 70–99)
Glucose-Capillary: 104 mg/dL — ABNORMAL HIGH (ref 70–99)
Glucose-Capillary: 95 mg/dL (ref 70–99)
Glucose-Capillary: 92 mg/dL (ref 70–99)

## 2023-03-17 LAB — HEPATITIS B SURFACE ANTIBODY, QUANTITATIVE: Hep B S AB Quant (Post): <3.5 m[IU]/mL — ABNORMAL LOW (ref >9.9)

## 2023-03-17 SURGERY — COLONOSCOPY WITH PROPOFOL
Anesthesia: General

## 2023-03-17 MED ORDER — POLYETHYLENE GLYCOL 3350 17 GM/SCOOP PO POWD: 17.0000 g | Freq: Every day | ORAL | 0 refills | Status: DC

## 2023-03-17 MED ORDER — STERILE WATER FOR IRRIGATION IR SOLN
Status: DC | PRN
  Administered 2023-03-17: 60 mL

## 2023-03-17 MED ORDER — LACTATED RINGERS IV SOLN: INTRAVENOUS | Status: DC | PRN

## 2023-03-17 MED ORDER — EPHEDRINE SULFATE (PRESSORS) 50 MG/ML IJ SOLN
INTRAMUSCULAR | Status: DC | PRN
  Administered 2023-03-17 (×2): 10 mg via INTRAVENOUS

## 2023-03-17 MED ORDER — PROPOFOL 10 MG/ML IV BOLUS
INTRAVENOUS | Status: DC | PRN
  Administered 2023-03-17: 20 mg via INTRAVENOUS
  Administered 2023-03-17: 60 mg via INTRAVENOUS

## 2023-03-17 MED ORDER — SODIUM CHLORIDE 0.9 % IV SOLN: INTRAVENOUS | Status: DC

## 2023-03-17 MED ORDER — LIDOCAINE HCL (CARDIAC) PF 100 MG/5ML IV SOSY
PREFILLED_SYRINGE | INTRAVENOUS | Status: DC | PRN
  Administered 2023-03-17: 50 mg via INTRAVENOUS

## 2023-03-17 MED ORDER — PROPOFOL 500 MG/50ML IV EMUL
INTRAVENOUS | Status: DC | PRN
  Administered 2023-03-17: 125 ug/kg/min via INTRAVENOUS

## 2023-03-17 MED ORDER — BENEFIBER PO POWD: ORAL | 0 refills | Status: DC

## 2023-03-17 NOTE — Progress Notes (Signed)
Patient not experiencing any further rectal bleeding. No abdominal pain. Only drank 1/4 of her prep last night after returning from dialysis. She stopped at midnight given she was told she needed nothing by mouth after midnight.   Discussed with Dr. Jena Gauss and will encourage patient to finish bowel prep prior to 12/12:30 today and will attempt colonoscopy after 2:30 pm today. She received 1 tap water enema this morning and advised second tap water enema rto be completed after she finishes bowel prep.   Brooke Bonito, MSN, APRN, FNP-BC, AGACNP-BC [facility] G[facility]ology at [facility]

## 2023-03-17 NOTE — Discharge Summary (Signed)
Physician Discharge Summary   Patient: Amy Moses MRN: [id number] DOB: 1971-07-15  Admit date:     03/15/2023  Discharge date: 03/17/23  Discharge Physician: Jacquelin Hawking, MD   PCP: Lorelei Pont, DO   Recommendations at discharge:  PCP follow-up GI follow-up for consideration of hemorrhoid banding Follow-up colonoscopy biopsy results No MRI until clip is gone  Discharge Diagnoses: Principal Problem:   Rectal bleeding Active Problems:   Acquired hypothyroidism   GERD (gastroesophageal reflux disease)   Mitral regurgitation  Resolved Problems:   * No resolved hospital problems. *  Hospital Course: Amy Moses is a 52 y.o. female with a history of right eye blindness, chronic diastolic heart failure, hypothyroidism, ESRD on HD, pulmonary hypertension. Patient presented secondary to bright red blood per rectum concerning for possible GI bleeding. GI consulted for evaluation and management.  Assessment and Plan:  GI bleeding Hematochezia Concern for lower GI bleeding on admission. Patient has a history of hemorrhoids which was concern for likely etiology of bleeding. GI consulted for evaluation and management and performed a colonoscopy on 4/26 which was significant for prominent hemorrhoids, thought to be the likely source of hemorrhage, but also a hemorrhagic ascending colonic polyp; the polyp was removed and one hemostasis clip placed at the base (not safe for MRI). GI recommendation for outpatient follow-up and consideration of hemorrhoid banding. Patient without recurrent bleeding prior to discharge. Hemoglobins table.   Anemia of chronic disease In setting of chronic kidney disease. Hemoglobin is stable.    ESRD on HD Nephrology consulted for hemodialysis while inpatient.   Diabetes mellitus type 2 Uncontrolled with hyperglycemia. Patient is managed on Humalog with TID with meals and Lantus 5 units daily. Last hemoglobin A1C of 9.4% from 09/2022. Patient resumed  on home dose of insulin glargine and started on SSI. Resume home regimen on discharge.   Thrombocytopenia Chronic. Stable.   Hypothyroidism Continue Synthroid   Hypotension Secondary to hemodialysis. Patient is prescribed midodrine for management but is not taking as an outpatient. Continue midodrine with HD.   Hyperlipidemia Continue Lipitor   Pressure injury Left posterior/proximal thigh. Present on admission.  Obesity Estimated body mass index is 32.24 kg/m as calculated from the following:   Height as of this encounter: 5\' 6"  (1.676 m).   Weight as of this encounter: 90.6 kg.    Consultants: Gastroenterology, Nephrology Procedures performed: Colonoscopy  Disposition: Home Diet recommendation: Renal diet   DISCHARGE MEDICATION: Allergies as of 03/17/2023       Reactions   Ace Inhibitors Cough        Medication List     STOP taking these medications    polyethylene glycol 17 g packet Commonly known as: MIRALAX / GLYCOLAX Replaced by: polyethylene glycol powder 17 GM/SCOOP powder       TAKE these medications    Accu-Chek Guide Me w/Device Kit 1 Piece by Does not apply route as directed.   Accu-Chek Guide test strip Generic drug: glucose blood Use as instructed   acetaminophen 500 MG tablet Commonly known as: TYLENOL Take 1,000 mg by mouth every 6 (six) hours as needed for moderate pain.   atorvastatin 10 MG tablet Commonly known as: LIPITOR Take 1 tablet (10 mg total) by mouth daily.   Auryxia 1 GM 210 MG(Fe) tablet Generic drug: ferric citrate Take 630 mg by mouth 2 (two) times daily with a meal.   Benefiber Powd Stir 2 teaspoons of Benefiber Original into 4-8 oz. of beverage or soft food (hot or cold),  three times daily. Stir well until dissolved (up to 60 seconds.)   Blood Pressure Monitor Kit TAKE BLOOD PRESSURE DAILY   FreeStyle Libre 2 Reader [name] As directed   [name] 2 Sensor Misc 1 Piece by Does not apply route every  14 (fourteen) days.   HumaLOG KwikPen 100 UNIT/ML KwikPen Generic drug: insulin lispro Inject 5-11 Units into the skin 3 (three) times daily with meals. If eats 50% or more of meal. What changed: additional instructions   insulin glargine 100 UNIT/ML injection Commonly known as: LANTUS Inject 0.2 mLs (20 Units total) into the skin at bedtime. What changed: how much to take   levothyroxine 75 MCG tablet Commonly known as: SYNTHROID Take 75 mcg by mouth daily.   midodrine 5 MG tablet Commonly known as: PROAMATINE Take 10 mg by mouth See admin instructions. Take 1 hour prior to dialysis What changed: Another medication with the same name was removed. Continue taking this medication, and follow the directions you see here.   pantoprazole 40 MG tablet Commonly known as: Protonix Take 1 tablet (40 mg total) by mouth 2 (two) times daily.   polyethylene glycol powder 17 GM/SCOOP powder Commonly known as: MiraLax Take 17 g by mouth daily. Replaces: polyethylene glycol 17 g packet   Vitamin D (Ergocalciferol) 1.25 MG (50000 UNIT) Caps capsule Commonly known as: DRISDOL Take 50,000 Units by mouth every 7 (seven) days. Friday        Follow-up Information     Lanelle Bal, DO. Schedule an appointment as soon as possible for a visit in 3 days.   Specialty: Gastroenterology Contact information: [address] [place] [address] [phone]         Lorelei Pont, DO.   Specialty: Family Medicine Contact information: [address] [place] [address] [phone]                Discharge Exam: BP 97/63   Pulse 71   Temp 98 F (36.7 C)   Resp 13   Ht 5\' 6"  (1.676 m)   Wt 90.6 kg   LMP 08/22/2015 (Approximate)   SpO2 100%   BMI 32.24 kg/m   General exam: Appears calm and comfortable Respiratory system: Clear to auscultation. Respiratory effort normal. Cardiovascular system: S1 & S2 heard Gastrointestinal system: Abdomen is nondistended, soft and  nontender. Normal bowel sounds heard. Central nervous system: Alert and oriented. No focal neurological deficits. Psychiatry: Judgement and insight appear normal. Mood & affect appropriate.   Condition at discharge: stable  The results of significant diagnostics from this hospitalization (including imaging, microbiology, ancillary and laboratory) are listed below for reference.   Imaging Studies: No results found.  Microbiology: Results for orders placed or performed during the hospital encounter of 09/30/22  C Difficile Quick Screen w PCR reflex     Status: None   Collection Time: 10/01/22  1:07 AM   Specimen: STOOL  Result Value Ref Range Status   C Diff antigen NEGATIVE NEGATIVE Final   C Diff toxin NEGATIVE NEGATIVE Final   C Diff interpretation No C. difficile detected.  Final    Comment: Performed at [name], [address], [place], [place] [address]    Labs: CBC: Recent Labs  Lab 03/15/23 1850 03/16/23 0455 03/17/23 0419  WBC 7.0 6.2 6.2  NEUTROABS 5.1  --   --   HGB 11.6* 10.8* 10.6*  HCT 37.6 35.3* 34.0*  MCV 95.9 95.4 93.7  PLT 103* 100* 108*   Basic Metabolic  Panel: Recent Labs  Lab 03/15/23 1850 03/16/23 0455 03/17/23 1242  NA 133* 133* 129*  K 4.8 4.6 4.0  CL 99 100 93*  CO2 23 22 25   GLUCOSE 187* 157* 107*  BUN 48* 51* 34*  CREATININE 7.13* 7.45* 5.86*  CALCIUM 8.4* 8.3* 8.3*   Liver Function Tests: Recent Labs  Lab 03/15/23 1850  AST 15  ALT 16  ALKPHOS 144*  BILITOT 0.7  PROT 7.1  ALBUMIN 4.0   CBG: Recent Labs  Lab 03/16/23 1625 03/16/23 2143 03/17/23 0743 03/17/23 1127 03/17/23 1402  GLUCAP 94 86 110* 104* 95    Discharge time spent: 35 minutes.  Signed: Jacquelin Hawking, MD [facility] 03/17/2023

## 2023-03-17 NOTE — Anesthesia Postprocedure Evaluation (Signed)
Anesthesia Post Note  Patient: Chartered loss adjuster  Procedure(s) Performed: COLONOSCOPY WITH PROPOFOL POLYPECTOMY  Patient location during evaluation: Phase II Anesthesia Type: General Level of consciousness: awake and alert and oriented Pain management: pain level controlled Vital Signs Assessment: post-procedure vital signs reviewed and stable Respiratory status: spontaneous breathing, nonlabored ventilation and respiratory function stable Cardiovascular status: blood pressure returned to baseline and stable Postop Assessment: no apparent nausea or vomiting Anesthetic complications: no  No notable events documented.   Last Vitals:  Vitals:   03/17/23 1545 03/17/23 1600  BP: (!) 75/52 97/63  Pulse: (!) 52 71  Resp: 12 13  Temp:    SpO2: 95% 100%    Last Pain:  Vitals:   03/17/23 1545  TempSrc:   PainSc: Asleep                 Aceton Kinnear C Chenay Nesmith

## 2023-03-17 NOTE — Progress Notes (Signed)
Patient drank 1/4 for bowel prep after coming back from dialysis. No complaints of pain. NPO since midnight with exception that patient was chewing gum this am. Continued to monitor.

## 2023-03-17 NOTE — Op Note (Signed)
[facility] Patient Name: Amy Moses Procedure Date: 03/17/2023 3:02 PM MRN: [id number] Date of Birth: [birth year] Attending MD: Gennette Pac , MD, [id number] CSN: [id number] Age: 52 Admit Type: Outpatient Procedure:                Colonoscopy Indications:              Hematochezia Providers:                Gennette Pac, MD, Dayton Scrape RN,                            RN, Jannett Celestine, RN, Edrick Kins, RN, Ina Homes,                            Technician Referring MD:              Medicines:                Propofol per Anesthesia Complications:            No immediate complications. Estimated Blood Loss:     Estimated blood loss was minimal. Procedure:                Pre-Anesthesia Assessment:                           - Prior to the procedure, a History and Physical                            was performed, and patient medications and                            allergies were reviewed. The patient's tolerance of                            previous anesthesia was also reviewed. The risks                            and benefits of the procedure and the sedation                            options and risks were discussed with the patient.                            All questions were answered, and informed consent                            was obtained. Prior Anticoagulants: The patient has                            taken no anticoagulant or antiplatelet agents. ASA                            Grade Assessment: III - A patient with severe  systemic disease. After reviewing the risks and                            benefits, the patient was deemed in satisfactory                            condition to undergo the procedure.                           After obtaining informed consent, the colonoscope                            was passed under direct vision. Throughout the                            procedure, the patient's  blood pressure, pulse, and                            oxygen saturations were monitored continuously. The                            [id number]) scope was introduced through the                            anus and advanced to the the cecum, identified by                            appendiceal orifice and ileocecal valve. The                            colonoscopy was performed without difficulty. The                            patient tolerated the procedure well. The quality                            of the bowel preparation was adequate. The                            ileocecal valve, appendiceal orifice, and rectum                            were photographed. Scope In: 3:28:08 PM Scope Out: 3:41:51 PM Scope Withdrawal Time: 0 hours 9 minutes 36 seconds  Total Procedure Duration: 0 hours 13 minutes 43 seconds  Findings:      Hemorrhoids were found on perianal exam. Prominent grade 3 hemorrhoids       seen externally and on retroflexion with associated anal papilla. Did       not see any active hemorrhoid bleeding.      .      A 10 mm polyp was found in the distal ascending colon. The polyp was       semi-pedunculated. It was hemorrhagic in appearance with overlying clot.       Please see photos.      The remainder  the colonic mucosa appeared unremarkable. The ascending       colon polyp was hot snare removed. 1 hemostasis clip placed at the base       to ensure closure. Impression:               - Prominent grade 3 hemorrhoids with associated                            anal papilla                           -Ascending colon polyp - hemorrhagic with                            clot?"indicative of recent bleeding. I suspect                            hematochezia more likely related to intermittent                            bleeding hemorrhoids. However, hemorrhagic                            ascending polyp was a surprise finding and could be                            a  contributing factor.                           - Moderate Sedation:      Moderate (conscious) sedation was personally administered by an       anesthesia professional. The following parameters were monitored: oxygen       saturation, heart rate, blood pressure, respiratory rate, EKG, adequacy       of pulmonary ventilation, and response to care. Recommendation:           - Return patient to hospital ward for ongoing care.                           - Advance diet as tolerated. Follow-up on                            pathology. No future MRI until clip gone. Recommend                            daily Benefiber and stool softener therapy to                            prevent constipation                           -She may benefit from outpatient hemorrhoid                            banding. I have provided her with a hemorrhoid  banding pamphlet. Will arrange office follow-up in                            2 weeks. At patient request, Amy Moses, daughter,                            [phone]?"reviewed findings and recommendations.                            Questions answered.. Anticipate discharge within                            the next 24 hours from a GI standpoint. Procedure Code(s):        --- Professional ---                           [phone], Colonoscopy, flexible; diagnostic, including                            collection of specimen(s) by brushing or washing,                            when performed (separate procedure) Diagnosis Code(s):        --- Professional ---                           D12.2, Benign neoplasm of ascending colon                           K64.9, Unspecified hemorrhoids                           K92.1, Melena (includes Hematochezia) CPT copyright 2022 American Medical Association. All rights reserved. The codes documented in this report are preliminary and upon coder review may  be revised to meet current compliance  requirements. Amy Moses. Amy Lott, MD Gennette Pac, MD 03/17/2023 3:59:06 PM This report has been signed electronically. Number of Addenda: 0

## 2023-03-17 NOTE — Progress Notes (Signed)
[facility] Progress Note   Assessment/ Plan:   1 BRBPR: h/o proctitis and hemorrhoids.  Hgb 11.6-->10.8.--> 10.6.  For C-scope today  2 ESRD:  TTS- hasn't missed any HD sessions. HD on schedule, next tomorrow (Saturday) UF as tolerated, no heparin in HD 3 Hypertension/ vol: UF as tolearted 4. Anemia of ESRD: monitor Hgb, ESA as appropriate 5. Metabolic Bone Disease:  no Ca/ vit D as h/o calciphylaxis.  Gean Quint is binder- consider switching to renvela d/t calciphylaxis but this would have to be OP decision  6.  Nutrition: renal idet Dispo: admitted  Subjective:    For colonoscopy today.     Objective:   BP 105/71 (BP Location: Right Arm)   Pulse 65   Temp 97.6 F (36.4 C)   Resp 17   Ht 5\' 6"  (1.676 m)   Wt 90.6 kg   LMP 08/22/2015 (Approximate)   SpO2 100%   BMI 32.24 kg/m   Physical Exam: [id number] on side, getting tap water enema NEURO; clear speech Remainder of exam deferred to preserve pt's dignity  Labs: BMET Recent Labs  Lab 03/15/23 1850 03/16/23 0455  NA 133* 133*  K 4.8 4.6  CL 99 100  CO2 23 22  GLUCOSE 187* 157*  BUN 48* 51*  CREATININE 7.13* 7.45*  CALCIUM 8.4* 8.3*   CBC Recent Labs  Lab 03/15/23 1850 03/16/23 0455 03/17/23 0419  WBC 7.0 6.2 6.2  NEUTROABS 5.1  --   --   HGB 11.6* 10.8* 10.6*  HCT 37.6 35.3* 34.0*  MCV 95.9 95.4 93.7  PLT 103* 100* 108*      Medications:     atorvastatin  10 mg Oral Daily   Chlorhexidine Gluconate Cloth  6 each Topical Q0600   ferric citrate  630 mg Oral BID WC   insulin aspart  0-5 Units Subcutaneous QHS   insulin aspart  0-9 Units Subcutaneous TID WC   insulin glargine-yfgn  5 Units Subcutaneous QHS   levothyroxine  75 mcg Oral Daily   [START ON 03/18/2023] midodrine  10 mg Oral Q T,Th,Sa-HD   pantoprazole  40 mg Oral BID   polyethylene glycol  17 g Oral Daily     Bufford Buttner, MD 03/17/2023, 1:05 PM

## 2023-03-17 NOTE — Care Management Obs Status (Signed)
MEDICARE OBSERVATION STATUS NOTIFICATION   Patient Details  Name: Gregg Holster MRN: [id number] Date of Birth: 1971-08-31   Medicare Observation Status Notification Given:  Yes    Corey Harold 03/17/2023, 8:25 AM

## 2023-03-17 NOTE — Transfer of Care (Signed)
Immediate Anesthesia Transfer of Care Note  Patient: Chartered loss adjuster  Procedure(s) Performed: COLONOSCOPY WITH PROPOFOL POLYPECTOMY  Patient Location: PACU  Anesthesia Type:General  Level of Consciousness: drowsy  Airway & Oxygen Therapy: Patient Spontanous Breathing and Patient connected to nasal cannula oxygen  Post-op Assessment: Report given to RN and Post -op Vital signs reviewed and stable  Post vital signs: Reviewed and stable  Last Vitals:  Vitals Value Taken Time  BP 90/62 03/17/23 1552  Temp 97.7   Pulse 62 03/17/23 1554  Resp 14 03/17/23 1554  SpO2 100 % 03/17/23 1554  Vitals shown include unvalidated device data.  Last Pain:  Vitals:   03/17/23 1526  TempSrc:   PainSc: 0-No pain         Complications: No notable events documented.

## 2023-03-17 NOTE — Interval H&P Note (Signed)
History and Physical Interval Note:  03/17/2023 3:19 PM  Amy Moses  has presented today for surgery, with the diagnosis of rectal bleeding.  The various methods of treatment have been discussed with the patient and family. After consideration of risks, benefits and other options for treatment, the patient has consented to  Procedure(s): COLONOSCOPY WITH PROPOFOL (N/A) as a surgical intervention.  The patient's history has been reviewed, patient examined, no change in status, stable for surgery.  I have reviewed the patient's chart and labs.  Questions were answered to the patient's satisfaction.     Adithya Difrancesco   no change.  Bleeding is tapered off hemoglobin 10.6 this morning (better than that on admission)  She cites occasional  constipation.>  Colonoscopy today per plan.  The risks, benefits, limitations, alternatives and imponderables have been reviewed with the patient. Questions have been answered. All parties are agreeable.

## 2023-03-17 NOTE — Anesthesia Preprocedure Evaluation (Addendum)
Anesthesia Evaluation  Patient identified by MRN, date of birth, ID band Patient awake    Reviewed: Allergy & Precautions, H&P , NPO status , Patient's Chart, lab work & pertinent test results  Airway Mallampati: II  TM Distance: >3 FB Neck ROM: Full    Dental  (+) Dental Advisory Given, Missing   Pulmonary pneumonia   Pulmonary exam normal breath sounds clear to auscultation       Cardiovascular Exercise Tolerance: Poor METS (wheel chair bound from knee injury)hypertension, Pt. on medications Normal cardiovascular exam Rhythm:Regular Rate:Normal     Neuro/Psych  Neuromuscular disease  negative psych ROS   GI/Hepatic Neg liver ROS,GERD  Medicated and Controlled,,  Endo/Other  diabetes, Well Controlled, Type 2, Oral Hypoglycemic Agents, Insulin DependentHypothyroidism    Renal/GU ESRF and DialysisRenal disease  negative genitourinary   Musculoskeletal negative musculoskeletal ROS (+)    Abdominal   Peds negative pediatric ROS (+)  Hematology  (+) Blood dyscrasia, anemia   Anesthesia Other Findings   Reproductive/Obstetrics negative OB ROS                             Anesthesia Physical Anesthesia Plan  ASA: 3  Anesthesia Plan: General   Post-op Pain Management: Minimal or no pain anticipated   Induction: Intravenous  PONV Risk Score and Plan: 1 and Treatment may vary due to age or medical condition and Propofol infusion  Airway Management Planned: Natural Airway and Nasal Cannula  Additional Equipment:   Intra-op Plan:   Post-operative Plan:   Informed Consent: I have reviewed the patients History and Physical, chart, labs and discussed the procedure including the risks, benefits and alternatives for the proposed anesthesia with the patient or authorized representative who has indicated his/her understanding and acceptance.     Dental advisory given  Plan Discussed with:  CRNA and Surgeon  Anesthesia Plan Comments:         Anesthesia Quick Evaluation

## 2023-03-20 ENCOUNTER — Telehealth: Payer: Self-pay

## 2023-03-20 ENCOUNTER — Inpatient Hospital Stay (HOSPITAL_COMMUNITY)
Admission: EM | Admit: 2023-03-20 | Discharge: 2023-03-25 | DRG: 393 | Disposition: A | Discharge status: Home Health | Payer: Medicare Other | Attending: Internal Medicine | Admitting: Internal Medicine

## 2023-03-20 ENCOUNTER — Encounter (HOSPITAL_COMMUNITY): Payer: Self-pay

## 2023-03-20 ENCOUNTER — Other Ambulatory Visit: Payer: Self-pay

## 2023-03-20 DIAGNOSIS — R5383 Other fatigue: Secondary | ICD-10-CM | POA: Diagnosis present

## 2023-03-20 DIAGNOSIS — I5032 Chronic diastolic (congestive) heart failure: Secondary | ICD-10-CM | POA: Diagnosis present

## 2023-03-20 DIAGNOSIS — E669 Obesity, unspecified: Secondary | ICD-10-CM | POA: Diagnosis present

## 2023-03-20 DIAGNOSIS — K921 Melena: Secondary | ICD-10-CM | POA: Diagnosis present

## 2023-03-20 DIAGNOSIS — K635 Polyp of colon: Secondary | ICD-10-CM | POA: Diagnosis present

## 2023-03-20 DIAGNOSIS — Z79899 Other long term (current) drug therapy: Secondary | ICD-10-CM

## 2023-03-20 DIAGNOSIS — R531 Weakness: Secondary | ICD-10-CM | POA: Diagnosis present

## 2023-03-20 DIAGNOSIS — I272 Pulmonary hypertension, unspecified: Secondary | ICD-10-CM | POA: Diagnosis present

## 2023-03-20 DIAGNOSIS — Z833 Family history of diabetes mellitus: Secondary | ICD-10-CM

## 2023-03-20 DIAGNOSIS — D631 Anemia in chronic kidney disease: Secondary | ICD-10-CM | POA: Diagnosis present

## 2023-03-20 DIAGNOSIS — E1151 Type 2 diabetes mellitus with diabetic peripheral angiopathy without gangrene: Secondary | ICD-10-CM | POA: Diagnosis present

## 2023-03-20 DIAGNOSIS — Z8249 Family history of ischemic heart disease and other diseases of the circulatory system: Secondary | ICD-10-CM

## 2023-03-20 DIAGNOSIS — K5900 Constipation, unspecified: Secondary | ICD-10-CM | POA: Diagnosis present

## 2023-03-20 DIAGNOSIS — K2981 Duodenitis with bleeding: Secondary | ICD-10-CM | POA: Diagnosis present

## 2023-03-20 DIAGNOSIS — Z992 Dependence on renal dialysis: Secondary | ICD-10-CM

## 2023-03-20 DIAGNOSIS — I132 Hypertensive heart and chronic kidney disease with heart failure and with stage 5 chronic kidney disease, or end stage renal disease: Secondary | ICD-10-CM | POA: Diagnosis present

## 2023-03-20 DIAGNOSIS — E119 Type 2 diabetes mellitus without complications: Secondary | ICD-10-CM | POA: Diagnosis present

## 2023-03-20 DIAGNOSIS — K219 Gastro-esophageal reflux disease without esophagitis: Secondary | ICD-10-CM | POA: Diagnosis present

## 2023-03-20 DIAGNOSIS — E782 Mixed hyperlipidemia: Secondary | ICD-10-CM | POA: Diagnosis present

## 2023-03-20 DIAGNOSIS — Z794 Long term (current) use of insulin: Secondary | ICD-10-CM

## 2023-03-20 DIAGNOSIS — E039 Hypothyroidism, unspecified: Secondary | ICD-10-CM | POA: Diagnosis present

## 2023-03-20 DIAGNOSIS — D62 Acute posthemorrhagic anemia: Secondary | ICD-10-CM | POA: Diagnosis present

## 2023-03-20 DIAGNOSIS — K642 Third degree hemorrhoids: Principal | ICD-10-CM | POA: Diagnosis present

## 2023-03-20 DIAGNOSIS — K254 Chronic or unspecified gastric ulcer with hemorrhage: Secondary | ICD-10-CM | POA: Diagnosis present

## 2023-03-20 DIAGNOSIS — E1122 Type 2 diabetes mellitus with diabetic chronic kidney disease: Secondary | ICD-10-CM | POA: Diagnosis present

## 2023-03-20 DIAGNOSIS — K922 Gastrointestinal hemorrhage, unspecified: Secondary | ICD-10-CM | POA: Diagnosis present

## 2023-03-20 DIAGNOSIS — H409 Unspecified glaucoma: Secondary | ICD-10-CM | POA: Diagnosis present

## 2023-03-20 DIAGNOSIS — Z841 Family history of disorders of kidney and ureter: Secondary | ICD-10-CM

## 2023-03-20 DIAGNOSIS — N186 End stage renal disease: Secondary | ICD-10-CM | POA: Diagnosis present

## 2023-03-20 DIAGNOSIS — H5461 Unqualified visual loss, right eye, normal vision left eye: Secondary | ICD-10-CM | POA: Diagnosis present

## 2023-03-20 DIAGNOSIS — Z888 Allergy status to other drugs, medicaments and biological substances status: Secondary | ICD-10-CM

## 2023-03-20 DIAGNOSIS — I10 Essential (primary) hypertension: Secondary | ICD-10-CM | POA: Diagnosis present

## 2023-03-20 DIAGNOSIS — D696 Thrombocytopenia, unspecified: Secondary | ICD-10-CM | POA: Diagnosis present

## 2023-03-20 DIAGNOSIS — I340 Nonrheumatic mitral (valve) insufficiency: Secondary | ICD-10-CM | POA: Diagnosis present

## 2023-03-20 DIAGNOSIS — L97129 Non-pressure chronic ulcer of left thigh with unspecified severity: Secondary | ICD-10-CM | POA: Diagnosis present

## 2023-03-20 DIAGNOSIS — Z6834 Body mass index (BMI) 34.0-34.9, adult: Secondary | ICD-10-CM

## 2023-03-20 DIAGNOSIS — Z79890 Hormone replacement therapy (postmenopausal): Secondary | ICD-10-CM

## 2023-03-20 LAB — COMPREHENSIVE METABOLIC PANEL
ALT: 12 U/L (ref 0–44)
AST: 13 U/L — ABNORMAL LOW (ref 15–41)
Albumin: 4.2 g/dL (ref 3.5–5.0)
Alkaline Phosphatase: 139 U/L — ABNORMAL HIGH (ref 38–126)
Anion gap: 13 (ref 5–15)
BUN: 65 mg/dL — ABNORMAL HIGH (ref 6–20)
CO2: 20 mmol/L — ABNORMAL LOW (ref 22–32)
Calcium: 8.5 mg/dL — ABNORMAL LOW (ref 8.9–10.3)
Chloride: 100 mmol/L (ref 98–111)
Creatinine, Ser: 9.86 mg/dL — ABNORMAL HIGH (ref 0.44–1.00)
GFR, Estimated: 4 mL/min — ABNORMAL LOW (ref >60)
Glucose, Bld: 127 mg/dL — ABNORMAL HIGH (ref 70–99)
Potassium: 5.5 mmol/L — ABNORMAL HIGH (ref 3.5–5.1)
Sodium: 133 mmol/L — ABNORMAL LOW (ref 135–145)
Total Bilirubin: 0.9 mg/dL (ref 0.3–1.2)
Total Protein: 7.5 g/dL (ref 6.5–8.1)

## 2023-03-20 LAB — CBC
HCT: 32.6 % — ABNORMAL LOW (ref 36.0–46.0)
Hemoglobin: 10.5 g/dL — ABNORMAL LOW (ref 12.0–15.0)
MCH: 30 pg (ref 26.0–34.0)
MCHC: 32.2 g/dL (ref 30.0–36.0)
MCV: 93.1 fL (ref 80.0–100.0)
Platelets: 127 10*3/uL — ABNORMAL LOW (ref 150–400)
RBC: 3.5 MIL/uL — ABNORMAL LOW (ref 3.87–5.11)
RDW: 15.1 % (ref 11.5–15.5)
WBC: 8.8 10*3/uL (ref 4.0–10.5)
nRBC: 0 % (ref 0.0–0.2)

## 2023-03-20 LAB — TYPE AND SCREEN

## 2023-03-20 NOTE — Telephone Encounter (Signed)
Pt called stating that she had a colonoscopy this past Friday and has noticed bleeding after having a bm today. Pt states that it was to the point that it was dripping in the toilet. Please advise.

## 2023-03-20 NOTE — ED Triage Notes (Addendum)
Pt arrived from home via POV c/o rectal bleeding. Pt showed this nurse pictures of stools from this afternoon and this evening with gross amounts of blood in toilet. Accompanied with sob and fatigue

## 2023-03-21 DIAGNOSIS — L97129 Non-pressure chronic ulcer of left thigh with unspecified severity: Secondary | ICD-10-CM | POA: Diagnosis not present

## 2023-03-21 DIAGNOSIS — E039 Hypothyroidism, unspecified: Secondary | ICD-10-CM

## 2023-03-21 DIAGNOSIS — I5032 Chronic diastolic (congestive) heart failure: Secondary | ICD-10-CM

## 2023-03-21 DIAGNOSIS — K219 Gastro-esophageal reflux disease without esophagitis: Secondary | ICD-10-CM | POA: Diagnosis not present

## 2023-03-21 DIAGNOSIS — N186 End stage renal disease: Secondary | ICD-10-CM

## 2023-03-21 DIAGNOSIS — E782 Mixed hyperlipidemia: Secondary | ICD-10-CM

## 2023-03-21 DIAGNOSIS — D696 Thrombocytopenia, unspecified: Secondary | ICD-10-CM

## 2023-03-21 DIAGNOSIS — E669 Obesity, unspecified: Secondary | ICD-10-CM

## 2023-03-21 DIAGNOSIS — Z992 Dependence on renal dialysis: Secondary | ICD-10-CM

## 2023-03-21 DIAGNOSIS — K922 Gastrointestinal hemorrhage, unspecified: Secondary | ICD-10-CM | POA: Diagnosis not present

## 2023-03-21 DIAGNOSIS — E1122 Type 2 diabetes mellitus with diabetic chronic kidney disease: Secondary | ICD-10-CM

## 2023-03-21 DIAGNOSIS — I10 Essential (primary) hypertension: Secondary | ICD-10-CM

## 2023-03-21 LAB — GLUCOSE, CAPILLARY
Glucose-Capillary: 125 mg/dL — ABNORMAL HIGH (ref 70–99)
Glucose-Capillary: 125 mg/dL — ABNORMAL HIGH (ref 70–99)
Glucose-Capillary: 145 mg/dL — ABNORMAL HIGH (ref 70–99)
Glucose-Capillary: 156 mg/dL — ABNORMAL HIGH (ref 70–99)

## 2023-03-21 LAB — RENAL FUNCTION PANEL
Albumin: 3.8 g/dL (ref 3.5–5.0)
Anion gap: 14 (ref 5–15)
BUN: 65 mg/dL — ABNORMAL HIGH (ref 6–20)
CO2: 18 mmol/L — ABNORMAL LOW (ref 22–32)
Calcium: 8.5 mg/dL — ABNORMAL LOW (ref 8.9–10.3)
Chloride: 102 mmol/L (ref 98–111)
Creatinine, Ser: 10.17 mg/dL — ABNORMAL HIGH (ref 0.44–1.00)
GFR, Estimated: 4 mL/min — ABNORMAL LOW (ref >60)
Glucose, Bld: 112 mg/dL — ABNORMAL HIGH (ref 70–99)
Phosphorus: 8.2 mg/dL — ABNORMAL HIGH (ref 2.5–4.6)
Potassium: 5.5 mmol/L — ABNORMAL HIGH (ref 3.5–5.1)
Sodium: 134 mmol/L — ABNORMAL LOW (ref 135–145)

## 2023-03-21 LAB — HEMOGLOBIN AND HEMATOCRIT, BLOOD
HCT: 30.4 % — ABNORMAL LOW (ref 36.0–46.0)
HCT: 30.4 % — ABNORMAL LOW (ref 36.0–46.0)
HCT: 30.7 % — ABNORMAL LOW (ref 36.0–46.0)
Hemoglobin: 9.6 g/dL — ABNORMAL LOW (ref 12.0–15.0)
Hemoglobin: 9.7 g/dL — ABNORMAL LOW (ref 12.0–15.0)
Hemoglobin: 9.8 g/dL — ABNORMAL LOW (ref 12.0–15.0)

## 2023-03-21 LAB — SURGICAL PATHOLOGY

## 2023-03-21 LAB — TYPE AND SCREEN
ABO/RH(D): O POS
Antibody Screen: NEGATIVE

## 2023-03-21 MED ORDER — SODIUM CHLORIDE 0.9 % IV SOLN: 250.0000 mL | INTRAVENOUS | Status: DC | PRN

## 2023-03-21 MED ORDER — INSULIN DETEMIR 100 UNIT/ML ~~LOC~~ SOLN
10.0000 [IU] | Freq: Every day | SUBCUTANEOUS | Status: DC
  Administered 2023-03-21 – 2023-03-24 (×5): 10 [IU] via SUBCUTANEOUS
  Filled 2023-03-21 (×6): qty 0.1

## 2023-03-21 MED ORDER — INSULIN ASPART 100 UNIT/ML IJ SOLN: 0.0000 [IU] | Freq: Every day | INTRAMUSCULAR | Status: DC

## 2023-03-21 MED ORDER — INSULIN ASPART 100 UNIT/ML IJ SOLN
0.0000 [IU] | Freq: Three times a day (TID) | INTRAMUSCULAR | Status: DC
  Administered 2023-03-21 – 2023-03-22 (×3): 2 [IU] via SUBCUTANEOUS
  Administered 2023-03-22: 5 [IU] via SUBCUTANEOUS
  Administered 2023-03-23: 3 [IU] via SUBCUTANEOUS
  Administered 2023-03-23 – 2023-03-24 (×3): 2 [IU] via SUBCUTANEOUS
  Administered 2023-03-24: 5 [IU] via SUBCUTANEOUS
  Administered 2023-03-24: 3 [IU] via SUBCUTANEOUS
  Administered 2023-03-25: 5 [IU] via SUBCUTANEOUS
  Administered 2023-03-25: 3 [IU] via SUBCUTANEOUS

## 2023-03-21 MED ORDER — ORAL CARE MOUTH RINSE: 15.0000 mL | OROMUCOSAL | Status: DC | PRN

## 2023-03-21 MED ORDER — LIDOCAINE HCL (PF) 1 % IJ SOLN: 5.0000 mL | INTRAMUSCULAR | Status: DC | PRN

## 2023-03-21 MED ORDER — SODIUM CHLORIDE 0.9% FLUSH: 3.0000 mL | INTRAVENOUS | Status: DC | PRN

## 2023-03-21 MED ORDER — MIDODRINE HCL 5 MG PO TABS
10.0000 mg | ORAL_TABLET | ORAL | Status: DC
  Administered 2023-03-21: 10 mg via ORAL
  Filled 2023-03-21: qty 2

## 2023-03-21 MED ORDER — SODIUM CHLORIDE 0.9% FLUSH
3.0000 mL | Freq: Two times a day (BID) | INTRAVENOUS | Status: DC
  Administered 2023-03-21 – 2023-03-25 (×9): 3 mL via INTRAVENOUS

## 2023-03-21 MED ORDER — FERRIC CITRATE 1 GM 210 MG(FE) PO TABS
630.0000 mg | ORAL_TABLET | Freq: Three times a day (TID) | ORAL | Status: DC
  Administered 2023-03-21 – 2023-03-25 (×10): 630 mg via ORAL
  Filled 2023-03-21 (×19): qty 3

## 2023-03-21 MED ORDER — ACETAMINOPHEN 325 MG PO TABS: 650.0000 mg | ORAL_TABLET | Freq: Four times a day (QID) | ORAL | Status: DC | PRN

## 2023-03-21 MED ORDER — LIDOCAINE-PRILOCAINE 2.5-2.5 % EX CREA: 1.0000 | TOPICAL_CREAM | CUTANEOUS | Status: DC | PRN

## 2023-03-21 MED ORDER — PANTOPRAZOLE SODIUM 40 MG IV SOLR
40.0000 mg | Freq: Two times a day (BID) | INTRAVENOUS | Status: DC
  Administered 2023-03-21 – 2023-03-25 (×10): 40 mg via INTRAVENOUS
  Filled 2023-03-21 (×11): qty 10

## 2023-03-21 MED ORDER — ONDANSETRON HCL 4 MG/2ML IJ SOLN: 4.0000 mg | Freq: Four times a day (QID) | INTRAMUSCULAR | Status: DC | PRN

## 2023-03-21 MED ORDER — ATORVASTATIN CALCIUM 10 MG PO TABS
10.0000 mg | ORAL_TABLET | Freq: Every day | ORAL | Status: DC
  Administered 2023-03-21 – 2023-03-25 (×5): 10 mg via ORAL
  Filled 2023-03-21 (×5): qty 1

## 2023-03-21 MED ORDER — CHLORHEXIDINE GLUCONATE CLOTH 2 % EX PADS
6.0000 | MEDICATED_PAD | Freq: Every day | CUTANEOUS | Status: DC
  Administered 2023-03-21 – 2023-03-24 (×4): 6 via TOPICAL

## 2023-03-21 MED ORDER — LEVOTHYROXINE SODIUM 75 MCG PO TABS
75.0000 ug | ORAL_TABLET | Freq: Every day | ORAL | Status: DC
  Administered 2023-03-21 – 2023-03-25 (×5): 75 ug via ORAL
  Filled 2023-03-21 (×5): qty 1

## 2023-03-21 MED ORDER — PENTAFLUOROPROP-TETRAFLUOROETH EX AERO: 1.0000 | INHALATION_SPRAY | CUTANEOUS | Status: DC | PRN

## 2023-03-21 MED ORDER — ONDANSETRON HCL 4 MG PO TABS: 4.0000 mg | ORAL_TABLET | Freq: Four times a day (QID) | ORAL | Status: DC | PRN

## 2023-03-21 NOTE — Assessment & Plan Note (Signed)
-  Stable and well-controlled -Continue to follow vital sign -Patient was just following diet control and dialysis to maintain blood pressure.

## 2023-03-21 NOTE — Assessment & Plan Note (Signed)
continue PPI

## 2023-03-21 NOTE — H&P (Signed)
History and Physical    Patient: Amy Moses ZOX:[id number] DOB: 05-Jan-1971 DOA: 03/20/2023 DOS: the patient was seen and examined on 03/21/2023 PCP: Lorelei Pont, DO   Patient coming from: Home  Chief Complaint:  Chief Complaint  Patient presents with   Rectal Bleeding   HPI: Amy Moses is a 53 y.o. female with medical history significant of type 2 diabetes with nephropathy, hypertension, hypothyroidism, end-stage renal disease on dialysis, GERD and recent admission secondary to lower GI bleed (which she was found on colonoscopy with a polyp); presented to the hospital secondary to bright red blood per rectum and maroon blood clots.  Symptoms has been present for the last 2 days and the patient presented to the hospital for further evaluation and management.  Patient denies chest pain, shortness of breath, dizziness and lightheadedness.  Workup in the ED demonstrated a hemoglobin that has dropped since his last discharge from 11.6-9.6; case was discussed with gastroenterology service who recommends keeping patient n.p.o. for further evaluation and management.    TRH has been contacted to place in the hospital to further facilitate care. Review of Systems: As mentioned in the history of present illness. All other systems reviewed and are negative. Past Medical History:  Diagnosis Date   Anemia    Blindness of right eye with low vision in contralateral eye    s/p victrectomy   Chronic diastolic heart failure (HCC) 03/30/2021   Diabetes mellitus, type II (HCC)    Dyslipidemia    Glaucoma    History of blood transfusion    Hypertension    Hypothyroidism (acquired)    Kidney disease    Stage 5   Mitral regurgitation 11/16/2022   Pneumonia    Pulmonary hypertension, unspecified (HCC) 09/12/2022   Past Surgical History:  Procedure Laterality Date   ABDOMINAL AORTOGRAM W/LOWER EXTREMITY Bilateral 12/18/2020   Procedure: ABDOMINAL AORTOGRAM W/LOWER EXTREMITY;  Surgeon:  Sherren Kerns, MD;  [facility];  Service: Cardiovascular;  Laterality: Bilateral;   ABDOMINAL AORTOGRAM W/LOWER EXTREMITY Bilateral 01/25/2022   Procedure: ABDOMINAL AORTOGRAM W/LOWER EXTREMITY;  Surgeon: Nada Libman, MD;  [facility];  Service: Cardiovascular;  Laterality: Bilateral;   AMPUTATION Right 02/16/2022   Procedure: RIGHT FOOT 5TH RAY AMPUTATION;  Surgeon: Nadara Mustard, MD;  Location: [facility] OR;  Service: Orthopedics;  Laterality: Right;   ANKLE FRACTURE SURGERY Right    AV FISTULA PLACEMENT Left 08/18/2020   Procedure: LEFT ARM BRACHIOCEPHALIC ARTERIOVENOUS (AV) FISTULA CREATION;  Surgeon: Sherren Kerns, MD;  Location: [facility] OR;  Service: Vascular;  Laterality: Left;   BIOPSY  04/24/2021   Procedure: BIOPSY;  Surgeon: Lanelle Bal, DO;  [facility];  Service: Endoscopy;;   CESAREAN SECTION     CHOLECYSTECTOMY     COLONOSCOPY  04/24/2021   Surgeon: Lanelle Bal, DO;  nonbleeding internal hemorrhoids, 1 large (25 mm) pedunculated transverse colon polyp (prolapse type polyp) with adherent clot and stigmata of recent bleed.   COLONOSCOPY WITH PROPOFOL N/A 05/14/2021   Procedure: COLONOSCOPY WITH PROPOFOL;  Surgeon: Corbin Ade, MD;  [facility];  Service: Endoscopy;  Laterality: N/A;   ESOPHAGOGASTRODUODENOSCOPY (EGD) WITH PROPOFOL N/A 04/24/2021   Surgeon: Lanelle Bal, DO;  duodenal erosions and gastritis biopsied (pathology with peptic duodenitis, reactive gastropathy with erosions/chronic inflammation, negative for H. pylori)   EYE SURGERY     Vatrectomy   HEMOSTASIS CLIP PLACEMENT  05/14/2021   Procedure: HEMOSTASIS CLIP PLACEMENT;  Surgeon: Corbin Ade, MD;  [facility];  Service: Endoscopy;;   IR PERC TUN PERIT CATH WO PORT S&I /IMAG  09/15/2020   IR REMOVAL TUN CV CATH W/O FL  02/19/2021   IR US GUIDE VASC ACCESS RIGHT  09/15/2020   POLYPECTOMY  04/24/2021   Procedure:  POLYPECTOMY;  Surgeon: Lanelle Bal, DO;  [facility];  Service: Endoscopy;;   POLYPECTOMY  05/14/2021   Procedure: POLYPECTOMY;  Surgeon: Corbin Ade, MD;  [facility];  Service: Endoscopy;;   SKIN SPLIT GRAFT Bilateral 09/03/2021   Procedure: SKIN GRAFT BILATERAL LEGS;  Surgeon: Nadara Mustard, MD;  Location: [name] OR;  Service: Orthopedics;  Laterality: Bilateral;   SKIN SPLIT GRAFT Left 12/10/2021   Procedure: IRRIGATION AND DEBRIDEMENT LEFT CALF, APPLICATION SPLIT THICKNESS SKIN GRAFT;  Surgeon: Nadara Mustard, MD;  [facility];  Service: Orthopedics;  Laterality: Left;   TOE SURGERY     Social History:  reports that she has never smoked. She has never used smokeless tobacco. She reports that she does not drink alcohol and does not use drugs.  Allergies  Allergen Reactions   Ace Inhibitors Cough    Family History  Problem Relation Age of Onset   Heart failure Mother    Heart disease Mother    Diabetes Mother    Kidney disease Mother    Heart failure Father    Diabetes Father    Heart disease Father    Diabetes Brother    Heart failure Maternal Grandmother    Heart failure Maternal Grandfather    Transient ischemic attack Maternal Grandfather    Colon cancer Neg Hx     Prior to Admission medications   Medication Sig Start Date End Date Taking? Authorizing Provider  acetaminophen (TYLENOL) 500 MG tablet Take 1,000 mg by mouth every 6 (six) hours as needed for moderate pain.    [provider]  atorvastatin (LIPITOR) 10 MG tablet Take 1 tablet (10 mg total) by mouth daily. 04/25/21   Shon Hale, MD  AURYXIA 1 GM 210 MG(Fe) tablet Take 630 mg by mouth 2 (two) times daily with a meal. 01/07/22   [provider]  Blood Glucose Monitoring Suppl (ACCU-CHEK GUIDE ME) w/Device KIT 1 Piece by Does not apply route as directed. 03/16/22   Roma Kayser, MD  Blood Pressure Monitor KIT TAKE BLOOD PRESSURE DAILY 09/15/22    Chilton Si, MD  Continuous Blood Gluc Receiver (FREESTYLE LIBRE 2 READER) DEVI As directed 04/06/22   Roma Kayser, MD  Continuous Blood Gluc Sensor (FREESTYLE LIBRE 2 SENSOR) MISC 1 Piece by Does not apply route every 14 (fourteen) days. 04/06/22   Roma Kayser, MD  glucose blood (ACCU-CHEK GUIDE) test strip Use as instructed 03/16/22   Roma Kayser, MD  HUMALOG KWIKPEN 100 UNIT/ML KwikPen Inject 5-11 Units into the skin 3 (three) times daily with meals. If eats 50% or more of meal. Patient taking differently: Inject 5-11 Units into the skin 3 (three) times daily with meals. If eats 50% or more of meal. Sliding scale 03/16/22   Nida, Denman George, MD  insulin glargine (LANTUS) 100 UNIT/ML injection Inject 0.2 mLs (20 Units total) into the skin at bedtime. Patient taking differently: Inject 8 Units into the skin at bedtime. 04/06/22   Roma Kayser, MD  levothyroxine (SYNTHROID) 75 MCG tablet Take 75 mcg by mouth daily. 06/13/22   [provider]  midodrine (PROAMATINE)  5 MG tablet Take 10 mg by mouth See admin instructions. Take 1 hour prior to dialysis Patient not taking: Reported on 03/15/2023 09/27/22   [provider]  pantoprazole (PROTONIX) 40 MG tablet Take 1 tablet (40 mg total) by mouth 2 (two) times daily. 04/25/21   Shon Hale, MD  polyethylene glycol powder (MIRALAX) 17 GM/SCOOP powder Take 17 g by mouth daily. 03/17/23   Narda Bonds, MD  Vitamin D, Ergocalciferol, (DRISDOL) 1.25 MG (50000 UNIT) CAPS capsule Take 50,000 Units by mouth every 7 (seven) days. Friday 04/20/21   [provider]  Wheat Dextrin (BENEFIBER) POWD Stir 2 teaspoons of Benefiber Original into 4-8 oz. of beverage or soft food (hot or cold), three times daily. Stir well until dissolved (up to 60 seconds.) 03/17/23   Narda Bonds, MD    Physical Exam: Vitals:   03/21/23 0130 03/21/23 0217 03/21/23 0219 03/21/23 0510  BP: 106/66 110/77  111/69   Pulse: 77 79  72  Resp: 15 18  14   Temp:  97.6 F (36.4 C)  97.7 F (36.5 C)  TempSrc:  Oral  Oral  SpO2: 98% 99%  98%  Weight:   93.4 kg   Height:   5\' 6"  (1.676 m)    General exam: Alert, awake, oriented x 3; no chest pain, no nausea or vomiting.  Patient is afebrile. Respiratory system: Clear to auscultation. Respiratory effort normal.  Good saturation on room air. Cardiovascular system:RRR. No rubs or gallops; no JVD. Gastrointestinal system: Abdomen is nondistended, soft and nontender. No organomegaly or masses felt. Normal bowel sounds heard. Central nervous system: No focal neurological deficits.  Muscle strength 5 out of 5 in all 4 limbs. Extremities: No C/C/E, +pedal pulses Skin: No petechiae.  Chronic left posterior/proximal thigh injury associated with calciphylaxis (present at time of admission), no signs of superimposed infection. Psychiatry: Judgement and insight appear normal. Mood & affect appropriate.   Data Reviewed: Comprehensive metabolic panel: Sodium 133, potassium 5.5, chloride 100, bicarb 20, BUN 65, creatinine 1.86; AST 13, ALT 12, alk phos 139 and total bilirubin 0.9 CBC: White blood cells 8.8, hemoglobin 10.5, platelet count 127 K. Repeat H&H: Demonstrating hemoglobin down to 9.6, hematocrit 30.4.  Assessment and Plan: * GI bleed -Patient presented with lower GI bleed, experiencing hematochezia and passing maroon-colored blood clots. -Recent admission with similar presentation where colonoscopy demonstrated positive polyp status post clipping. -Continue to follow hemoglobin trend and transfuse as needed -GI service consulted with recommendation to keep patient n.p.o. for further evaluation and management.  ESRD (end stage renal disease) on dialysis [facility]) -Nephrology consulted to facilitate inpatient dialysis -Patient schedule Tuesday, Thursday and Saturday -She has not missed any dialysis treatment lately. -Checking renal panel.  Type 2 diabetes  mellitus with ESRD (end-stage renal disease) (HCC) -Resume sliding scale insulin and Semglee -Follow CBGs fluctuation.  Acquired hypothyroidism -Continue Synthroid.  Mixed hyperlipidemia -Continue statin.  Primary hypertension -Stable and well-controlled -Continue to follow vital sign -Patient was just following diet control and dialysis to maintain blood pressure.  Thrombocytopenia (HCC) -Chronic and stable -Continue to follow platelet count trend -SCDs for DVT prophylaxis.  GERD (gastroesophageal reflux disease) -continue PPI  Obesity (BMI 30-39.9) -Body mass index is 33.23 kg/m. -Low-calorie diet and portion control discussed with patient.  Chronic ulcer of left thigh (HCC) -Secondary to calciphylaxis -No signs of superimposed infection appreciated -Continue to follow stability.  Chronic diastolic heart failure (HCC) -Stable and compensated -Continue volume management with dialysis.  Advance Care Planning:   Code Status: Full Code   Consults: Gastroenterology and nephrology service  Family Communication: No family at bedside.  Severity of Illness: The appropriate patient status for this patient is OBSERVATION. Observation status is judged to be reasonable and necessary in order to provide the required intensity of service to ensure the patient's safety. The patient's presenting symptoms, physical exam findings, and initial radiographic and laboratory data in the context of their medical condition is felt to place them at decreased risk for further clinical deterioration. Furthermore, it is anticipated that the patient will be medically stable for discharge from the hospital within 2 midnights of admission.   Author: Vassie Loll, MD 03/21/2023 7:17 AM  For on call review [email or web address].

## 2023-03-21 NOTE — Assessment & Plan Note (Signed)
Continue statin. 

## 2023-03-21 NOTE — Progress Notes (Signed)
HEMODIALYSIS TREATMENT NOTE:  Uneventful 3.75 hour heparin-free treatment completed using left upper arm AVF (15g/antegrade).  400 ml removed during first 15 minutes of UF only.  Then, tolerated removal of another 2.1 L during 3.5 hours of HD.  Remained hemodynamically stable with soft pressures.  All blood was returned and hemostasis was achieved in 20 minutes.  No changes from pre-HD assessment.    Post-dialysis:  03/21/23 1945  Vitals  BP 107/60  MAP (mmHg) 74  BP Location Right Arm  BP Method Automatic  Patient Position (if appropriate) Lying  Pulse Rate 78  Pulse Rate Source Monitor  ECG Heart Rate 79  Resp 15  Oxygen Therapy  SpO2 99 %  O2 Device Room Air  Post Treatment  Dialyzer Clearance Lightly streaked  Duration of HD Treatment -hour(s) 3.75 hour(s)  Hemodialysis Intake (mL) 0 mL  Liters Processed 89.2  Fluid Removed (mL) 2500 mL  Tolerated HD Treatment Yes  Post-Hemodialysis Comments Goal met  AVG/AVF Arterial Site Held (minutes) 10 minutes  AVG/AVF Venous Site Held (minutes) 10 minutes  Fistula / Graft Left Upper arm Arteriovenous fistula  No placement date or time found.   Placed prior to admission: Yes  Orientation: Left  Access Location: Upper arm  Access Type: Arteriovenous fistula  Fistula / Graft Assessment Thrill;Bruit  Status Patent   Pt was transported from [facility] to 338 where hand-off was given to Olga Millers, RN  Arman Filter, RN

## 2023-03-21 NOTE — Consult Note (Signed)
Reason for Consult: To manage dialysis and dialysis related needs Referring Physician: Carolann Brazell is an 52 y.o. female with HTN, DM, PAD, diastolic CHF and pulmonary HTN, ESRD-  TTS and calciphylaxis.  Pt had hosp last week for BRBPR-  had colonoscopy = grade 3 hemorrhoids and a polyp that was removed.  She returned to ER last night with rectal bleeding  hgb 10.5--9.6 admitted for GI consult-  plan is pending.  We are asked to see for dialysis needs-  today is dialysis day-  K last evening was 5.5.  She is alert this AM- has not had any more bleeding -  no other c/o's-  tells me did NOT go to HD on Saturday   Dialyzes at [facility] TTS 3 hours 45 minutes  EDW 93. HD Bath unknown, Dialyzer unknown, Heparin unknown. Access AVF.  Past Medical History:  Diagnosis Date   Anemia    Blindness of right eye with low vision in contralateral eye    s/p victrectomy   Chronic diastolic heart failure (HCC) 03/30/2021   Diabetes mellitus, type II (HCC)    Dyslipidemia    Glaucoma    History of blood transfusion    Hypertension    Hypothyroidism (acquired)    Kidney disease    Stage 5   Mitral regurgitation 11/16/2022   Pneumonia    Pulmonary hypertension, unspecified (HCC) 09/12/2022    Past Surgical History:  Procedure Laterality Date   ABDOMINAL AORTOGRAM W/LOWER EXTREMITY Bilateral 12/18/2020   Procedure: ABDOMINAL AORTOGRAM W/LOWER EXTREMITY;  Surgeon: Sherren Kerns, MD;  [facility];  Service: Cardiovascular;  Laterality: Bilateral;   ABDOMINAL AORTOGRAM W/LOWER EXTREMITY Bilateral 01/25/2022   Procedure: ABDOMINAL AORTOGRAM W/LOWER EXTREMITY;  Surgeon: Nada Libman, MD;  [facility];  Service: Cardiovascular;  Laterality: Bilateral;   AMPUTATION Right 02/16/2022   Procedure: RIGHT FOOT 5TH RAY AMPUTATION;  Surgeon: Nadara Mustard, MD;  Location: [name] OR;  Service: Orthopedics;  Laterality: Right;   ANKLE FRACTURE SURGERY Right     AV FISTULA PLACEMENT Left 08/18/2020   Procedure: LEFT ARM BRACHIOCEPHALIC ARTERIOVENOUS (AV) FISTULA CREATION;  Surgeon: Sherren Kerns, MD;  Location: [facility] OR;  Service: Vascular;  Laterality: Left;   BIOPSY  04/24/2021   Procedure: BIOPSY;  Surgeon: Lanelle Bal, DO;  [facility];  Service: Endoscopy;;   CESAREAN SECTION     CHOLECYSTECTOMY     COLONOSCOPY  04/24/2021   Surgeon: Lanelle Bal, DO;  nonbleeding internal hemorrhoids, 1 large (25 mm) pedunculated transverse colon polyp (prolapse type polyp) with adherent clot and stigmata of recent bleed.   COLONOSCOPY WITH PROPOFOL N/A 05/14/2021   Procedure: COLONOSCOPY WITH PROPOFOL;  Surgeon: Corbin Ade, MD;  [facility];  Service: Endoscopy;  Laterality: N/A;   ESOPHAGOGASTRODUODENOSCOPY (EGD) WITH PROPOFOL N/A 04/24/2021   Surgeon: Lanelle Bal, DO;  duodenal erosions and gastritis biopsied (pathology with peptic duodenitis, reactive gastropathy with erosions/chronic inflammation, negative for H. pylori)   EYE SURGERY     Vatrectomy   HEMOSTASIS CLIP PLACEMENT  05/14/2021   Procedure: HEMOSTASIS CLIP PLACEMENT;  Surgeon: Corbin Ade, MD;  [facility];  Service: Endoscopy;;   IR PERC TUN PERIT CATH WO PORT S&I /IMAG  09/15/2020   IR REMOVAL TUN CV CATH W/O FL  02/19/2021   IR US GUIDE VASC ACCESS RIGHT  09/15/2020   POLYPECTOMY  04/24/2021   Procedure: POLYPECTOMY;  Surgeon:  Lanelle Bal, DO;  [facility];  Service: Endoscopy;;   POLYPECTOMY  05/14/2021   Procedure: POLYPECTOMY;  Surgeon: Corbin Ade, MD;  [facility];  Service: Endoscopy;;   SKIN SPLIT GRAFT Bilateral 09/03/2021   Procedure: SKIN GRAFT BILATERAL LEGS;  Surgeon: Nadara Mustard, MD;  Location[facility] OR;  Service: Orthopedics;  Laterality: Bilateral;   SKIN SPLIT GRAFT Left 12/10/2021   Procedure: IRRIGATION AND DEBRIDEMENT LEFT CALF, APPLICATION SPLIT THICKNESS SKIN GRAFT;  Surgeon:  Nadara Mustard, MD;  [facility];  Service: Orthopedics;  Laterality: Left;   TOE SURGERY      Family History  Problem Relation Age of Onset   Heart failure Mother    Heart disease Mother    Diabetes Mother    Kidney disease Mother    Heart failure Father    Diabetes Father    Heart disease Father    Diabetes Brother    Heart failure Maternal Grandmother    Heart failure Maternal Grandfather    Transient ischemic attack Maternal Grandfather    Colon cancer Neg Hx     Social History:  reports that she has never smoked. She has never used smokeless tobacco. She reports that she does not drink alcohol and does not use drugs.  Allergies:  Allergies  Allergen Reactions   Ace Inhibitors Cough    Medications: I have reviewed the patient's current medications.  Results for orders placed or performed during the hospital encounter of 03/20/23 (from the past 48 hour(s))  Comprehensive metabolic panel     Status: Abnormal   Collection Time: 03/20/23 10:52 PM  Result Value Ref Range   Sodium 133 (L) 135 - 145 mmol/L   Potassium 5.5 (H) 3.5 - 5.1 mmol/L   Chloride 100 98 - 111 mmol/L   CO2 20 (L) 22 - 32 mmol/L   Glucose, Bld 127 (H) 70 - 99 mg/dL    Comment: Glucose reference range applies only to samples taken after fasting for at least 8 hours.   BUN 65 (H) 6 - 20 mg/dL   Creatinine, Ser 2.95 (H) 0.44 - 1.00 mg/dL   Calcium 8.5 (L) 8.9 - 10.3 mg/dL   Total Protein 7.5 6.5 - 8.1 g/dL   Albumin 4.2 3.5 - 5.0 g/dL   AST 13 (L) 15 - 41 U/L   ALT 12 0 - 44 U/L   Alkaline Phosphatase 139 (H) 38 - 126 U/L   Total Bilirubin 0.9 0.3 - 1.2 mg/dL   GFR, Estimated 4 (L) >60 mL/min    Comment: (NOTE) Calculated using the CKD-EPI Creatinine Equation (2021)    Anion gap 13 5 - 15    Comment: Performed at [facility], [address] [place], [place] [address]  CBC     Status: Abnormal   Collection Time: 03/20/23 10:52 PM  Result Value Ref Range   WBC 8.8 4.0 - 10.5 K/uL   RBC  3.50 (L) 3.87 - 5.11 MIL/uL   Hemoglobin 10.5 (L) 12.0 - 15.0 g/dL   HCT 86.5 (L) 78.4 - 69.6 %   MCV 93.1 80.0 - 100.0 fL   MCH 30.0 26.0 - 34.0 pg   MCHC 32.2 30.0 - 36.0 g/dL   RDW 29.5 28.4 - 13.2 %   Platelets 127 (L) 150 - 400 K/uL   nRBC 0.0 0.0 - 0.2 %    Comment: Performed at [facility], [address], [place], [place] [address]  Type and screen  [facility]     Status: None   Collection Time: 03/20/23 10:52 PM  Result Value Ref Range   ABO/RH(D) O POS    Antibody Screen NEG    Sample Expiration      03/23/2023,2359 Performed at [facility], [address] [place], [place] [address]   Glucose, capillary     Status: Abnormal   Collection Time: 03/21/23  2:12 AM  Result Value Ref Range   Glucose-Capillary 125 (H) 70 - 99 mg/dL    Comment: Glucose reference range applies only to samples taken after fasting for at least 8 hours.  Hemoglobin and hematocrit, blood     Status: Abnormal   Collection Time: 03/21/23  4:57 AM  Result Value Ref Range   Hemoglobin 9.6 (L) 12.0 - 15.0 g/dL   HCT 60.4 (L) 54.0 - 98.1 %    Comment: Performed at [facility], [address], [place], [place] [address]  Glucose, capillary     Status: Abnormal   Collection Time: 03/21/23  8:00 AM  Result Value Ref Range   Glucose-Capillary 125 (H) 70 - 99 mg/dL    Comment: Glucose reference range applies only to samples taken after fasting for at least 8 hours.    No results found.  ROS: only BRBPR Blood pressure (!) 98/55, pulse 72, temperature 97.9 F (36.6 C), temperature source Oral, resp. rate 16, height 5\' 6"  (1.676 m), weight 93.4 kg, last menstrual period 08/22/2015, SpO2 99 %. General appearance: alert and cooperative Resp: clear to auscultation bilaterally Cardio: regular rate and rhythm, S1, S2 normal, no murmur, click, rub or gallop GI: soft, non-tender; bowel sounds normal; no masses,  no organomegaly Extremities: edema legs are wrapped but has 2+ edema as well  Left upper  AVF-  patent   Assessment/Plan: 52 year old BF with many medical issued including ESRD-  admit for recurrent BRBPR after hosp for same last week  1 BRBPR-  gi has seen-  is s/p polypectomy last week-  plan is pending -  hgb 9.6 down from 10.5 2 ESRD: normally TTS at [facility]-  missed Tuesday -  will plan for HD today -  no heparin-  needs some UF 3 Hypertension/vol: overloaded but also hypotensive-  seems to be on midodrine pre HD at home-  will place order for-  low temp and dedicated UF 4. Anemia of ESRD: is present but also maybe having ABLA-  will dose ESA 5. Metabolic Bone Disease: will continue auryxia 3 TID   Cecille Aver 03/21/2023, 9:46 AM

## 2023-03-21 NOTE — Consult Note (Addendum)
WOC Nurse Consult Note: Reason for Consult: Consult requested for bilat legs.  Performed remotely after review of progress notes. Pt is followed as an outpatient by Dr Lajoyce Corners of the ortho team.  She has chronic healing full thickness wounds to BLE related to calciphylaxis. Pt has lymphedema pumps at home to treat lymphedema. Dr Lajoyce Corners recently assessed the wounds as an outpatient on 3/11 and stated they were improving. She has compression wraps ordered to reduce swelling and promote healing. Continue present plan of care.   Topical treatment orders provided for bedside nurses to perform as follows:  Change bilat  leg wraps Q Tues and Sat as follows: Apply Xeroform gauze over wounds, then apply kerlex in a spiral fashion, beginning just behind toes to below knees, then ace wrap in the same fashion. Pt should continue follow-up with Dr Lajoyce Corners after discharge. Please re-consult if further assistance is needed.  Thank-you,  Cammie Mcgee MSN, RN, CWOCN, [place], CNS [phone]

## 2023-03-21 NOTE — Assessment & Plan Note (Signed)
-  Secondary to calciphylaxis -No signs of superimposed infection appreciated -Continue to follow stability.

## 2023-03-21 NOTE — Assessment & Plan Note (Signed)
-  Body mass index is 33.23 kg/m. -Low-calorie diet and portion control discussed with patient.

## 2023-03-21 NOTE — Assessment & Plan Note (Signed)
-  Patient presented with lower GI bleed, experiencing hematochezia and passing maroon-colored blood clots. -Recent admission with similar presentation where colonoscopy demonstrated positive polyp status post clipping. -Continue to follow hemoglobin trend and transfuse as needed -GI service consulted with recommendations being followed -monitor for recurrence of bleeding -no BM since arrival -miralax started by GI team 5/1

## 2023-03-21 NOTE — Telephone Encounter (Signed)
Pt is currently admitted at [facility].

## 2023-03-21 NOTE — Consult Note (Signed)
@LOGO @   Referring Provider: Geoffery Lyons, MD  Primary Care Physician:  Lorelei Pont, DO Primary Gastroenterologist:  Dr. Jena Gauss  Date of Admission: 03/20/23 Date of Consultation: 03/21/23  Reason for Consultation:  Rectal bleeding  HPI:  Amy Moses is a 52 y.o. year old female with medical history significant of blindness of the right eye, chronic diastolic CHF, diabetes mellitus type 2, dyslipidemia, hypertension, hypothyroidism, ESRD on HD, pulmonary hypertension, colon polyps with history of post polypectomy bleed, recent admission on 03/16/2023 for rectal bleeding undergoing colonoscopy revealing grade 3 hemorrhoids and 10 mm ascending colon polyp with hemorrhagic appearance and overlying clot s/p hot snare polypectomy and hemostasis clip x 1.  Patient returned to emergency room 4/29 with rectal bleeding, shortness of breath, fatigue.  ED course: Hemodynamically stable. Hemoglobin was stable at 10.5 (10.6 on 4/26) She was started on pantoprazole twice daily and made NPO, admitted overnight for observation.  Consult: This morning, BP low at 98/55.  Hemoglobin 9.6.  Reports she has been doing well after colonoscopy until yesterday when she had 2 episodes of rectal bleeding.  Blood was bright red on toilet tissue and also filled the toilet bowl with associated clots.  Stools are chronically dark on iron daily.  Overall, bleeding is similar to the bleeding she had prior to her recent colonoscopy. Last episode of bleeding was around 5:30 pm yesterday.   No abdominal pain, nausea, vomiting. Stools were soft, no straining No rectal pain. Felt a little lightheaded yesterday.  Anticoagulation: Heparin at dialysis. Last dialysis was Thursday. Usually T, Th, S  NSAIDs: None   Takes PPI twice daily outpatient.  Colonoscopy 03/17/2023: Prominent grade 3 hemorrhoids with associated anal papilla, 10 mm ascending colon polyp, hemorrhagic in appearance with overlying clot s/p hot snare  polypectomy and hemostasis clip x 1.  Recommended office visit for hemorrhoid banding.  EGD June 2022: -Gastritis status post biopsy, reactive gastropathy with erosions and chronic inflammation.  No H. pylori -Duodenal erosions without bleeding status post biopsy, Peptic duodenitis  Past Medical History:  Diagnosis Date   Anemia    Blindness of right eye with low vision in contralateral eye    s/p victrectomy   Chronic diastolic heart failure (HCC) 03/30/2021   Diabetes mellitus, type II (HCC)    Dyslipidemia    Glaucoma    History of blood transfusion    Hypertension    Hypothyroidism (acquired)    Kidney disease    Stage 5   Mitral regurgitation 11/16/2022   Pneumonia    Pulmonary hypertension, unspecified (HCC) 09/12/2022    Past Surgical History:  Procedure Laterality Date   ABDOMINAL AORTOGRAM W/LOWER EXTREMITY Bilateral 12/18/2020   Procedure: ABDOMINAL AORTOGRAM W/LOWER EXTREMITY;  Surgeon: Sherren Kerns, MD;  Location: [facility];  Service: Cardiovascular;  Laterality: Bilateral;   ABDOMINAL AORTOGRAM W/LOWER EXTREMITY Bilateral 01/25/2022   Procedure: ABDOMINAL AORTOGRAM W/LOWER EXTREMITY;  Surgeon: Nada Libman, MD;  Location: [facility];  Service: Cardiovascular;  Laterality: Bilateral;   AMPUTATION Right 02/16/2022   Procedure: RIGHT FOOT 5TH RAY AMPUTATION;  Surgeon: Nadara Mustard, MD;  Location: [facility] OR;  Service: Orthopedics;  Laterality: Right;   ANKLE FRACTURE SURGERY Right    AV FISTULA PLACEMENT Left 08/18/2020   Procedure: LEFT ARM BRACHIOCEPHALIC ARTERIOVENOUS (AV) FISTULA CREATION;  Surgeon: Sherren Kerns, MD;  Location: [facility] OR;  Service: Vascular;  Laterality: Left;   BIOPSY  04/24/2021   Procedure: BIOPSY;  Surgeon: Lanelle Bal, DO;  Location: [facility];  Service: Endoscopy;;   CESAREAN SECTION     CHOLECYSTECTOMY     COLONOSCOPY  04/24/2021   Surgeon: Lanelle Bal, DO;  nonbleeding internal hemorrhoids, 1 large  (25 mm) pedunculated transverse colon polyp (prolapse type polyp) with adherent clot and stigmata of recent bleed.   COLONOSCOPY WITH PROPOFOL N/A 05/14/2021   Procedure: COLONOSCOPY WITH PROPOFOL;  Surgeon: Corbin Ade, MD;  Location: [facility];  Service: Endoscopy;  Laterality: N/A;   ESOPHAGOGASTRODUODENOSCOPY (EGD) WITH PROPOFOL N/A 04/24/2021   Surgeon: Lanelle Bal, DO;  duodenal erosions and gastritis biopsied (pathology with peptic duodenitis, reactive gastropathy with erosions/chronic inflammation, negative for H. pylori)   EYE SURGERY     Vatrectomy   HEMOSTASIS CLIP PLACEMENT  05/14/2021   Procedure: HEMOSTASIS CLIP PLACEMENT;  Surgeon: Corbin Ade, MD;  Location: [facility];  Service: Endoscopy;;   IR PERC TUN PERIT CATH WO PORT S&I /IMAG  09/15/2020   IR REMOVAL TUN CV CATH W/O FL  02/19/2021   IR US GUIDE VASC ACCESS RIGHT  09/15/2020   POLYPECTOMY  04/24/2021   Procedure: POLYPECTOMY;  Surgeon: Lanelle Bal, DO;  [facility]: [facility];  Service: Endoscopy;;   POLYPECTOMY  05/14/2021   Procedure: POLYPECTOMY;  Surgeon: Corbin Ade, MD;  Location: [facility];  Service: Endoscopy;;   SKIN SPLIT GRAFT Bilateral 09/03/2021   Procedure: SKIN GRAFT BILATERAL LEGS;  Surgeon: Nadara Mustard, MD;  Location: [facility] OR;  Service: Orthopedics;  Laterality: Bilateral;   SKIN SPLIT GRAFT Left 12/10/2021   Procedure: IRRIGATION AND DEBRIDEMENT LEFT CALF, APPLICATION SPLIT THICKNESS SKIN GRAFT;  Surgeon: Nadara Mustard, MD;  Location: [facility] OR;  Service: Orthopedics;  Laterality: Left;   TOE SURGERY      Prior to Admission medications   Medication Sig Start Date End Date Taking? Authorizing Provider  acetaminophen (TYLENOL) 500 MG tablet Take 1,000 mg by mouth every 6 (six) hours as needed for moderate pain.    [provider]  atorvastatin (LIPITOR) 10 MG tablet Take 1 tablet (10 mg total) by mouth daily. 04/25/21   Shon Hale, MD  AURYXIA 1 GM 210  MG(Fe) tablet Take 630 mg by mouth 2 (two) times daily with a meal. 01/07/22   [provider]  Blood Glucose Monitoring Suppl (ACCU-CHEK GUIDE ME) w/Device KIT 1 Piece by Does not apply route as directed. 03/16/22   Roma Kayser, MD  Blood Pressure Monitor KIT TAKE BLOOD PRESSURE DAILY 09/15/22   Chilton Si, MD  Continuous Blood Gluc Receiver (FREESTYLE LIBRE 2 READER) DEVI As directed 04/06/22   Roma Kayser, MD  Continuous Blood Gluc Sensor (FREESTYLE LIBRE 2 SENSOR) MISC 1 Piece by Does not apply route every 14 (fourteen) days. 04/06/22   Roma Kayser, MD  glucose blood (ACCU-CHEK GUIDE) test strip Use as instructed 03/16/22   Roma Kayser, MD  HUMALOG KWIKPEN 100 UNIT/ML KwikPen Inject 5-11 Units into the skin 3 (three) times daily with meals. If eats 50% or more of meal. Patient taking differently: Inject 5-11 Units into the skin 3 (three) times daily with meals. If eats 50% or more of meal. Sliding scale 03/16/22   Nida, Denman George, MD  insulin glargine (LANTUS) 100 UNIT/ML injection Inject 0.2 mLs (20 Units total) into the skin at bedtime. Patient taking differently: Inject 8 Units into the skin at bedtime. 04/06/22   Roma Kayser, MD  levothyroxine (SYNTHROID) 75 MCG tablet  Take 75 mcg by mouth daily. 06/13/22   [provider]  midodrine (PROAMATINE) 5 MG tablet Take 10 mg by mouth See admin instructions. Take 1 hour prior to dialysis Patient not taking: Reported on 03/15/2023 09/27/22   [provider]  pantoprazole (PROTONIX) 40 MG tablet Take 1 tablet (40 mg total) by mouth 2 (two) times daily. 04/25/21   Shon Hale, MD  polyethylene glycol powder (MIRALAX) 17 GM/SCOOP powder Take 17 g by mouth daily. 03/17/23   Narda Bonds, MD  Vitamin D, Ergocalciferol, (DRISDOL) 1.25 MG (50000 UNIT) CAPS capsule Take 50,000 Units by mouth every 7 (seven) days. Friday 04/20/21   [provider]  Wheat Dextrin  (BENEFIBER) POWD Stir 2 teaspoons of Benefiber Original into 4-8 oz. of beverage or soft food (hot or cold), three times daily. Stir well until dissolved (up to 60 seconds.) 03/17/23   Narda Bonds, MD    Current Facility-Administered Medications  Medication Dose Route Frequency Provider Last Rate Last Admin   0.9 %  sodium chloride infusion  250 mL Intravenous PRN Vassie Loll, MD       acetaminophen (TYLENOL) tablet 650 mg  650 mg Oral Q6H PRN Zierle-Ghosh, Asia B, DO       atorvastatin (LIPITOR) tablet 10 mg  10 mg Oral Daily Vassie Loll, MD       ferric citrate (AURYXIA) tablet 630 mg  630 mg Oral BID WC Vassie Loll, MD       insulin aspart (novoLOG) injection 0-15 Units  0-15 Units Subcutaneous TID WC Zierle-Ghosh, Asia B, DO       insulin aspart (novoLOG) injection 0-5 Units  0-5 Units Subcutaneous QHS Zierle-Ghosh, Asia B, DO       insulin detemir (LEVEMIR) injection 10 Units  10 Units Subcutaneous QHS Zierle-Ghosh, Asia B, DO   10 Units at 03/21/23 0252   levothyroxine (SYNTHROID) tablet 75 mcg  75 mcg Oral Q0600 Zierle-Ghosh, Asia B, DO   75 mcg at 03/21/23 0557   ondansetron (ZOFRAN) tablet 4 mg  4 mg Oral Q6H PRN Vassie Loll, MD       Or   ondansetron [name]) injection 4 mg  4 mg Intravenous Q6H PRN Vassie Loll, MD       Oral care mouth rinse  15 mL Mouth Rinse PRN Zierle-Ghosh, Asia B, DO       pantoprazole (PROTONIX) injection 40 mg  40 mg Intravenous Q12H Zierle-Ghosh, Asia B, DO   40 mg at 03/21/23 0215   sodium chloride flush (NS) 0.9 % injection 3 mL  3 mL Intravenous Q12H Vassie Loll, MD       sodium chloride flush (NS) 0.9 % injection 3 mL  3 mL Intravenous PRN Vassie Loll, MD        Allergies as of 03/20/2023 - Review Complete 03/20/2023  Allergen Reaction Noted   Ace inhibitors Cough 11/19/2020    Family History  Problem Relation Age of Onset   Heart failure Mother    Heart disease Mother    Diabetes Mother    Kidney disease Mother    Heart  failure Father    Diabetes Father    Heart disease Father    Diabetes Brother    Heart failure Maternal Grandmother    Heart failure Maternal Grandfather    Transient ischemic attack Maternal Grandfather    Colon cancer Neg Hx     Social History   Socioeconomic History   Marital status: Single  Spouse name: Not on file   Number of children: Not on file   Years of education: Not on file   Highest education level: Not on file  Occupational History   Not on file  Tobacco Use   Smoking status: Never   Smokeless tobacco: Never  Vaping Use   Vaping Use: Never used  Substance and Sexual Activity   Alcohol use: No   Drug use: No   Sexual activity: Yes    Birth control/protection: Condom  Other Topics Concern   Not on file  Social History Narrative   Not on file   Social Determinants of Health   Financial Resource Strain: Not on file  Food Insecurity: No Food Insecurity (03/21/2023)   Hunger Vital Sign    Worried About Running Out of Food in the Last Year: Never true    Ran Out of Food in the Last Year: Never true  Transportation Needs: No Transportation Needs (03/21/2023)   PRAPARE - Administrator, Civil Service (Medical): No    Lack of Transportation (Non-Medical): No  Physical Activity: Not on file  Stress: Not on file  Social Connections: Not on file  Intimate Partner Violence: Not At Risk (03/21/2023)   Humiliation, Afraid, Rape, and Kick questionnaire    Fear of Current or Ex-Partner: No    Emotionally Abused: No    Physically Abused: No    Sexually Abused: No    Review of Systems: Gen: Denies fever, chills, cold or flu like symptoms, pre-syncope, or syncope.  CV: Denies chest pain, heart palpitations. Resp: Denies shortness of breath, cough.  GI: See HPI GU : Denies urinary burning, urinary frequency, urinary incontinence.  Heme: See HPI  Physical Exam: Vital signs in last 24 hours: Temp:  [97.6 F (36.4 C)-97.9 F (36.6 C)] 97.9 F (36.6  C) (04/30 0757) Pulse Rate:  [72-79] 72 (04/30 0757) Resp:  [13-25] 16 (04/30 0757) BP: (98-124)/(55-79) 98/55 (04/30 0757) SpO2:  [96 %-100 %] 99 % (04/30 0757) Weight:  [93.4 kg] 93.4 kg (04/30 0219)   General:   Alert,  Well-developed, well-nourished, pleasant and cooperative in NAD Head:  Normocephalic and atraumatic. Eyes:  Sclera clear, no icterus.   Conjunctiva pink. Ears:  Normal auditory acuity. Heart:  Regular rate and rhythm; no murmurs, clicks, rubs,  or gallops. Abdomen:  Soft, nontender and nondistended. No masses, hepatosplenomegaly or hernias noted. Normal bowel sounds, without guarding, and without rebound.   Rectal:  Deferred  Msk:  Symmetrical without gross deformities. Normal posture. Extremities:  With trace edema, both LE wrapped.  Neurologic:  Alert and  oriented x4;  grossly normal neurologically. Skin:  Intact without significant lesions or rashes. Psych:  Normal mood and affect.  Intake/Output from previous day: No intake/output data recorded. Intake/Output this shift: No intake/output data recorded.  Lab Results: Recent Labs    03/20/23 2252 03/21/23 0457  WBC 8.8  --   HGB 10.5* 9.6*  HCT 32.6* 30.4*  PLT 127*  --    BMET Recent Labs    03/20/23 2252  NA 133*  K 5.5*  CL 100  CO2 20*  GLUCOSE 127*  BUN 65*  CREATININE 9.86*  CALCIUM 8.5*   LFT Recent Labs    03/20/23 2252  PROT 7.5  ALBUMIN 4.2  AST 13*  ALT 12  ALKPHOS 139*  BILITOT 0.9     Impression: 52 y.o. year old female with medical history significant of blindness of the right eye, chronic diastolic  CHF, diabetes mellitus type 2, dyslipidemia, hypertension, hypothyroidism, ESRD on HD, pulmonary hypertension, colon polyps with history of post polypectomy bleed, recent admission on 03/16/2023 for rectal bleeding undergoing colonoscopy revealing grade 3 hemorrhoids and 10 mm ascending colon polyp with hemorrhagic appearance and overlying clot s/p hot snare polypectomy and  hemostasis clip x 1.  Patient returned to emergency room 4/29 with rectal bleeding, shortness of breath, fatigue.  Rectal bleeding:  Hemorrhoidal versus post polypectomy bleed.  Recent admission for rectal bleeding undergoing colonoscopy 4/26 revealing grade 3 hemorrhoids, 2 mm ascending polyp with hemorrhagic appearance and overlying clot s/p hot snare polypectomy and hemostasis clip x 1.  Patient reports doing well following colonoscopy until yesterday when she had 2 episodes of bright red blood per rectum with passing clots. Last episode of bleeding around 5:30 pm yesterday.  Reports this is similar to bleeding prior to recent colonoscopy.  Notably, hemoglobin was stable on admission at 10.5, but declined to 9.6 this morning.  Blood pressure has been soft.  Denies NSAIDs.  She does receive heparin at dialysis with last session on Thursday last week.   Will discuss possible repeat colonoscopy with Dr. Levon Hedger. Colonoscopy would be tomorrow if pursued. Otherwise, recommend outpatient hemorrhoid banding.   Plan: Monitor H/H closely. Transfuse as needed.  Monitor for ongoing overt GI bleeding.  Clear liquid diet.  Discuss possible colonoscopy with Dr. Levon Hedger. Would be tomorrow if pursued.  Hemorrhoid banding outpatient.    LOS: 0 days    03/21/2023, 8:00 AM   Ermalinda Memos, [facility]

## 2023-03-21 NOTE — Assessment & Plan Note (Signed)
-  Resume sliding scale insulin and Semglee -Follow CBGs fluctuation. CBG (last 3)  Recent Labs    03/21/23 2134 03/22/23 0628 03/22/23 1129  GLUCAP 156* 105* 214*

## 2023-03-21 NOTE — Assessment & Plan Note (Signed)
-  Chronic and stable -Continue to follow platelet count trend -SCDs for DVT prophylaxis.

## 2023-03-21 NOTE — Assessment & Plan Note (Signed)
-  Stable and compensated -Continue volume management with dialysis.

## 2023-03-21 NOTE — Assessment & Plan Note (Addendum)
Continue Synthroid °

## 2023-03-21 NOTE — ED Notes (Signed)
ED TO INPATIENT HANDOFF REPORT  ED Nurse Name and Phone #:   S Name/Age/Gender Amy Moses 52 y.o. female Room/Bed: APA04/APA04  Code Status   Code Status: Prior  Home/SNF/Other Home Patient oriented to: self, place, time, and situation Is this baseline? Yes   Triage Complete: Triage complete  Chief Complaint GI bleed [K92.2]  Triage Note Pt arrived from home via POV c/o rectal bleeding. Pt showed this nurse pictures of stools from this afternoon and this evening with gross amounts of blood in toilet. Accompanied with sob and fatigue    Allergies Allergies  Allergen Reactions   Ace Inhibitors Cough    Level of Care/Admitting Diagnosis ED Disposition     ED Disposition  Admit   Condition  --   Comment  Hospital Area: [facility] [[id number]]  Level of Care: Med-Surg [16]  Covid Evaluation: Asymptomatic - no recent exposure (last 10 days) testing not required  Diagnosis: GI bleed [161096]  Admitting Physician: Lilyan Gilford [[id number]]  Attending Physician: Lilyan Gilford [[id number]]          B Medical/Surgery History Past Medical History:  Diagnosis Date   Anemia    Blindness of right eye with low vision in contralateral eye    s/p victrectomy   Chronic diastolic heart failure (HCC) 03/30/2021   Diabetes mellitus, type II (HCC)    Dyslipidemia    Glaucoma    History of blood transfusion    Hypertension    Hypothyroidism (acquired)    Kidney disease    Stage 5   Mitral regurgitation 11/16/2022   Pneumonia    Pulmonary hypertension, unspecified (HCC) 09/12/2022   Past Surgical History:  Procedure Laterality Date   ABDOMINAL AORTOGRAM W/LOWER EXTREMITY Bilateral 12/18/2020   Procedure: ABDOMINAL AORTOGRAM W/LOWER EXTREMITY;  Surgeon: Sherren Kerns, MD;  Location: [facility] INVASIVE CV LAB;  Service: Cardiovascular;  Laterality: Bilateral;   ABDOMINAL AORTOGRAM W/LOWER EXTREMITY Bilateral 01/25/2022   Procedure: ABDOMINAL AORTOGRAM  W/LOWER EXTREMITY;  Surgeon: Nada Libman, MD;  Location: [facility] INVAS[facility] CV LAB;  Service: Cardiovascular;  Laterality: Bilateral;   AMPUTATION Right 02/16/2022   Procedure: RIGHT FOOT 5TH RAY AMPUTATION;  Surgeon: Nadara Mustard, MD;  Location: [facility] OR;  Service: Orthopedics;  Laterality: Right;   ANKLE FRACTURE SURGERY Right    AV FISTULA PLACEMENT Left 08/18/2020   Procedure: LEFT ARM BRACHIOCEPHALIC ARTERIOVENOUS (AV) FISTULA CREATION;  Surgeon: Sherren Kerns, MD;  Location: [name] OR;  Service: Vascular;  Laterality: Left;   BIOPSY  04/24/2021   Procedure: BIOPSY;  Surgeon: Lanelle Bal, DO;  Location: [facility] ENDO [facility];  Service: Endoscopy;;   CESAREAN SECTION     CHOLECYSTECTOMY     COLONOSCOPY  04/24/2021   Surgeon: Lanelle Bal, DO;  nonbleeding internal hemorrhoids, 1 large (25 mm) pedunculated transverse colon polyp (prolapse type polyp) with adherent clot and stigmata of recent bleed.   COLONOSCOPY WITH PROPOFOL N/A 05/14/2021   Procedure: COLONOSCOPY WITH PROPOFOL;  Surgeon: Corbin Ade, MD;  [facility]: [facility];  Service: Endoscopy;  Laterality: N/A;   ESOPHAGOGASTRODUODENOSCOPY (EGD) WITH PROPOFOL N/A 04/24/2021   Surgeon: Lanelle Bal, DO;  duodenal erosions and gastritis biopsied (pathology with peptic duodenitis, reactive gastropathy with erosions/chronic inflammation, negative for H. pylori)   EYE SURGERY     Vatrectomy   HEMOSTASIS CLIP PLACEMENT  05/14/2021   Procedure: HEMOSTASIS CLIP PLACEMENT;  Surgeon: Corbin Ade, MD;  [facility]: [facility];  Service: Endoscopy;;   IR  PERC TUN PERIT CATH WO PORT S&I /IMAG  09/15/2020   IR REMOVAL TUN CV CATH W/O FL  02/19/2021   IR US GUIDE VASC ACCESS RIGHT  09/15/2020   POLYPECTOMY  04/24/2021   Procedure: POLYPECTOMY;  Surgeon: Lanelle Bal, DO;  [facility];  Service: Endoscopy;;   POLYPECTOMY  05/14/2021   Procedure: POLYPECTOMY;  Surgeon: Corbin Ade, MD;  [facility];   Service: Endoscopy;;   SKIN SPLIT GRAFT Bilateral 09/03/2021   Procedure: SKIN GRAFT BILATERAL LEGS;  Surgeon: Nadara Mustard, MD;  Location: [facility] OR;  Service: Orthopedics;  Laterality: Bilateral;   SKIN SPLIT GRAFT Left 12/10/2021   Procedure: IRRIGATION AND DEBRIDEMENT LEFT CALF, APPLICATION SPLIT THICKNESS SKIN GRAFT;  Surgeon: Nadara Mustard, MD;  Location: [facility] OR;  Service: Orthopedics;  Laterality: Left;   TOE SURGERY       A IV Location/Drains/Wounds Patient Lines/Drains/Airways Status     Active Line/Drains/Airways     Name Placement date Placement time Site Days   Peripheral IV 03/20/23 20 G 1" Anterior;Proximal;Right Forearm 03/20/23  2245  Forearm  1   Fistula / Graft Left Upper arm Arteriovenous fistula --  --  Upper arm  --   Negative Pressure Wound Therapy Foot Right 02/16/22  1408  --  398   Pressure Injury 09/03/21 Thigh Anterior;Distal;Left 09/03/21  1724  -- 564   Pressure Injury 01/24/22 Buttocks Bilateral Stage 3 -  Full thickness tissue loss. Subcutaneous fat may be visible but bone, tendon or muscle are NOT exposed. 01/24/22  2300  -- 421   Pressure Injury 10/01/22 Thigh Left Stage 3 -  Full thickness tissue loss. Subcutaneous fat may be visible but bone, tendon or muscle are NOT exposed. stage 3 to right posterior thigh measuring 3 cm x 1 cm 10/01/22  0408  -- 171   Pressure Injury 10/01/22 Thigh Right Stage 2 -  Partial thickness loss of dermis presenting as a shallow open injury with a red, pink wound bed without slough. stage 2 to right thigh measuring 1cm x 1 cm 10/01/22  0409  -- 171   Pressure Injury 10/01/22 Thigh Right Stage 3 -  Full thickness tissue loss. Subcutaneous fat may be visible but bone, tendon or muscle are NOT exposed. stage 3 to sacrum measuring 3 cmx 1 cm 10/01/22  0410  -- 171   Pressure Injury 03/16/23 Thigh Left;Posterior;Proximal Stage 2 -  Partial thickness loss of dermis presenting as a shallow open injury with a red, pink wound bed without  slough. wound base is red, bleeding 03/16/23  0001  -- 5   Wound / Incision (Open or Dehisced) 09/03/21 Other (Comment) Leg Left;Posterior calciphylaxis 09/03/21  1214  Leg  564   Wound / Incision (Open or Dehisced) 01/24/22 Foot Anterior;Right;Lateral 01/24/22  2300  Foot  421   Wound / Incision (Open or Dehisced) 01/24/22 Tibial Left;Posterior;Proximal;Bilateral;Right 01/24/22  2300  Tibial  421   Wound / Incision (Open or Dehisced) 04/30/22 Non-pressure wound Foot Right;Lateral 04/30/22  1200  Foot  325            Intake/Output Last 24 hours No intake or output data in the 24 hours ending 03/21/23 0141  Labs/Imaging Results for orders placed or performed during the hospital encounter of 03/20/23 (from the past 48 hour(s))  Comprehensive metabolic panel     Status: Abnormal   Collection Time: 03/20/23 10:52 PM  Result Value Ref Range   Sodium 133 (  L) 135 - 145 mmol/L   Potassium 5.5 (H) 3.5 - 5.1 mmol/L   Chloride 100 98 - 111 mmol/L   CO2 20 (L) 22 - 32 mmol/L   Glucose, Bld 127 (H) 70 - 99 mg/dL    Comment: Glucose reference range applies only to samples taken after fasting for at least 8 hours.   BUN 65 (H) 6 - 20 mg/dL   Creatinine, Ser 6.96 (H) 0.44 - 1.00 mg/dL   Calcium 8.5 (L) 8.9 - 10.3 mg/dL   Total Protein 7.5 6.5 - 8.1 g/dL   Albumin 4.2 3.5 - 5.0 g/dL   AST 13 (L) 15 - 41 U/L   ALT 12 0 - 44 U/L   Alkaline Phosphatase 139 (H) 38 - 126 U/L   Total Bilirubin 0.9 0.3 - 1.2 mg/dL   GFR, Estimated 4 (L) >60 mL/min    Comment: (NOTE) Calculated using the CKD-EPI Creatinine Equation (2021)    Anion gap 13 5 - 15    Comment: Performed at [facility], [address], [place], [place] [address]  CBC     Status: Abnormal   Collection Time: 03/20/23 10:52 PM  Result Value Ref Range   WBC 8.8 4.0 - 10.5 K/uL   RBC 3.50 (L) 3.87 - 5.11 MIL/uL   Hemoglobin 10.5 (L) 12.0 - 15.0 g/dL   HCT 41.3 (L) 24.4 - 01.0 %   MCV 93.1 80.0 - 100.0 fL   MCH 30.0 26.0 - 34.0 pg    MCHC 32.2 30.0 - 36.0 g/dL   RDW 27.2 53.6 - 64.4 %   Platelets 127 (L) 150 - 400 K/uL   nRBC 0.0 0.0 - 0.2 %    Comment: Performed at [facility], [address], [place], [place] [address]  Type and screen [facility]     Status: None   Collection Time: 03/20/23 10:52 PM  Result Value Ref Range   ABO/RH(D) O POS    Antibody Screen NEG    Sample Expiration      03/23/2023,2359 Performed at [facility], [address], [place], [place] [address]    No results found.  Pending Labs Unresulted Labs (From admission, onward)     Start     Ordered   03/21/23 0400  Hemoglobin and hematocrit, blood  Now then every 6 hours,   R (with TIMED occurrences)      03/21/23 0054            Vitals/Pain Today's Vitals   03/21/23 0000 03/21/23 0030 03/21/23 0100 03/21/23 0130  BP: 120/70 110/65 109/64 106/66  Pulse: 78 76 76 77  Resp: (!) 25 17 13 15   Temp:      TempSrc:      SpO2: 97% 96% 100% 98%  PainSc:        Isolation Precautions No active isolations  Medications Medications  insulin aspart (novoLOG) injection 0-15 Units (has no administration in time range)  pantoprazole (PROTONIX) injection 40 mg (has no administration in time range)  insulin aspart (novoLOG) injection 0-5 Units (has no administration in time range)  insulin detemir (LEVEMIR) injection 10 Units (has no administration in time range)  levothyroxine (SYNTHROID) tablet 75 mcg (has no administration in time range)  acetaminophen (TYLENOL) tablet 650 mg (has no administration in time range)    Mobility walks     Focused Assessments   R Recommendations: See Admitting Provider Note  Report given to:   Additional Notes:

## 2023-03-21 NOTE — Assessment & Plan Note (Signed)
-  Nephrology consulted to facilitate inpatient dialysis -Patient schedule Tuesday, Thursday and Saturday -She has not missed any dialysis treatment lately. -stable renal panel.

## 2023-03-21 NOTE — ED Provider Notes (Signed)
[place] EMERGENCY DEPARTMENT AT [name] Provider Note   CSN: [id number] Arrival date & time: 03/20/23  2032     History  Chief Complaint  Patient presents with   Rectal Bleeding    Zykira Matlack is a 52 y.o. female.  Patient is a 52 year old female with extensive past medical history including end-stage renal disease on hemodialysis, type 2 diabetes, colon polyps with recent colonoscopy and polypectomy.  Patient presenting today with complaints of rectal bleeding.  She has not had 2 episodes today where she has passed bright red stool mixed with clots and dark material.  She feels somewhat weak and fatigued, but denies chest pain.  She denies to me she is having any abdominal pain.  No fevers or chills.  The history is provided by the patient.       Home Medications Prior to Admission medications   Medication Sig Start Date End Date Taking? Authorizing Provider  acetaminophen (TYLENOL) 500 MG tablet Take 1,000 mg by mouth every 6 (six) hours as needed for moderate pain.    [provider]  atorvastatin (LIPITOR) 10 MG tablet Take 1 tablet (10 mg total) by mouth daily. 04/25/21   Shon Hale, MD  AURYXIA 1 GM 210 MG(Fe) tablet Take 630 mg by mouth 2 (two) times daily with a meal. 01/07/22   [provider]  Blood Glucose Monitoring Suppl (ACCU-CHEK GUIDE ME) w/Device KIT 1 Piece by Does not apply route as directed. 03/16/22   Roma Kayser, MD  Blood Pressure Monitor KIT TAKE BLOOD PRESSURE DAILY 09/15/22   Chilton Si, MD  Continuous Blood Gluc Receiver (FREESTYLE LIBRE 2 READER) DEVI As directed 04/06/22   Roma Kayser, MD  Continuous Blood Gluc Sensor (FREESTYLE LIBRE 2 SENSOR) MISC 1 Piece by Does not apply route every 14 (fourteen) days. 04/06/22   Roma Kayser, MD  glucose blood (ACCU-CHEK GUIDE) test strip Use as instructed 03/16/22   Roma Kayser, MD  HUMALOG KWIKPEN 100 UNIT/ML KwikPen Inject 5-11  Units into the skin 3 (three) times daily with meals. If eats 50% or more of meal. Patient taking differently: Inject 5-11 Units into the skin 3 (three) times daily with meals. If eats 50% or more of meal. Sliding scale 03/16/22   Nida, Denman George, MD  insulin glargine (LANTUS) 100 UNIT/ML injection Inject 0.2 mLs (20 Units total) into the skin at bedtime. Patient taking differently: Inject 8 Units into the skin at bedtime. 04/06/22   Roma Kayser, MD  levothyroxine (SYNTHROID) 75 MCG tablet Take 75 mcg by mouth daily. 06/13/22   [provider]  midodrine (PROAMATINE) 5 MG tablet Take 10 mg by mouth See admin instructions. Take 1 hour prior to dialysis Patient not taking: Reported on 03/15/2023 09/27/22   [provider]  pantoprazole (PROTONIX) 40 MG tablet Take 1 tablet (40 mg total) by mouth 2 (two) times daily. 04/25/21   Shon Hale, MD  polyethylene glycol powder (MIRALAX) 17 GM/SCOOP powder Take 17 g by mouth daily. 03/17/23   Narda Bonds, MD  Vitamin D, Ergocalciferol, (DRISDOL) 1.25 MG (50000 UNIT) CAPS capsule Take 50,000 Units by mouth every 7 (seven) days. Friday 04/20/21   [provider]  Wheat Dextrin (BENEFIBER) POWD Stir 2 teaspoons of Benefiber Original into 4-8 oz. of beverage or soft food (hot or cold), three times daily. Stir well until dissolved (up to 60 seconds.) 03/17/23   Narda Bonds, MD      Allergies  Ace inhibitors    Review of Systems   Review of Systems  All other systems reviewed and are negative.   Physical Exam Updated Vital Signs BP 124/79 (BP Location: Right Arm)   Pulse 79   Temp 97.9 F (36.6 C) (Oral)   Resp 18   LMP 08/22/2015 (Approximate)   SpO2 98%  Physical Exam Vitals and nursing note reviewed.  Constitutional:      General: She is not in acute distress.    Appearance: She is well-developed. She is not diaphoretic.  HENT:     Head: Normocephalic and atraumatic.  Cardiovascular:     Rate  and Rhythm: Normal rate and regular rhythm.     Heart sounds: No murmur heard.    No friction rub. No gallop.  Pulmonary:     Effort: Pulmonary effort is normal. No respiratory distress.     Breath sounds: Normal breath sounds. No wheezing.  Abdominal:     General: Bowel sounds are normal. There is no distension.     Palpations: Abdomen is soft.     Tenderness: There is no abdominal tenderness.  Musculoskeletal:        General: Normal range of motion.     Cervical back: Normal range of motion and neck supple.  Skin:    General: Skin is warm and dry.  Neurological:     General: No focal deficit present.     Mental Status: She is alert and oriented to person, place, and time.     ED Results / Procedures / Treatments   Labs (all labs ordered are listed, but only abnormal results are displayed) Labs Reviewed  COMPREHENSIVE METABOLIC PANEL - Abnormal; Notable for the following components:      Result Value   Sodium 133 (*)    Potassium 5.5 (*)    CO2 20 (*)    Glucose, Bld 127 (*)    BUN 65 (*)    Creatinine, Ser 9.86 (*)    Calcium 8.5 (*)    AST 13 (*)    Alkaline Phosphatase 139 (*)    GFR, Estimated 4 (*)    All other components within normal limits  CBC - Abnormal; Notable for the following components:   RBC 3.50 (*)    Hemoglobin 10.5 (*)    HCT 32.6 (*)    Platelets 127 (*)    All other components within normal limits  POC OCCULT BLOOD, ED  TYPE AND SCREEN    EKG None  Radiology No results found.  Procedures Procedures  {Document cardiac monitor, telemetry assessment procedure when appropriate:1}  Medications Ordered in ED Medications - No data to display  ED Course/ Medical Decision Making/ A&P   {   Click here for ABCD2, HEART and other calculatorsREFRESH Note before signing :1}                          Medical Decision Making Amount and/or Complexity of Data Reviewed Labs: ordered.   ***  {Document critical care time when  appropriate:1} {Document review of labs and clinical decision tools ie heart score, Chads2Vasc2 etc:1}  {Document your independent review of radiology images, and any outside records:1} {Document your discussion with family members, caretakers, and with consultants:1} {Document social determinants of health affecting pt's care:1} {Document your decision making why or why not admission, treatments were needed:1} Final Clinical Impression(s) / ED Diagnoses Final diagnoses:  None    Rx / DC Orders ED  Discharge Orders     None

## 2023-03-22 ENCOUNTER — Telehealth (INDEPENDENT_AMBULATORY_CARE_PROVIDER_SITE_OTHER): Payer: Self-pay | Admitting: Gastroenterology

## 2023-03-22 ENCOUNTER — Encounter (HOSPITAL_COMMUNITY): Payer: Self-pay | Admitting: Internal Medicine

## 2023-03-22 DIAGNOSIS — K219 Gastro-esophageal reflux disease without esophagitis: Secondary | ICD-10-CM | POA: Diagnosis present

## 2023-03-22 DIAGNOSIS — K921 Melena: Secondary | ICD-10-CM | POA: Diagnosis present

## 2023-03-22 DIAGNOSIS — K254 Chronic or unspecified gastric ulcer with hemorrhage: Secondary | ICD-10-CM | POA: Diagnosis present

## 2023-03-22 DIAGNOSIS — Z9189 Other specified personal risk factors, not elsewhere classified: Secondary | ICD-10-CM | POA: Diagnosis not present

## 2023-03-22 DIAGNOSIS — D631 Anemia in chronic kidney disease: Secondary | ICD-10-CM | POA: Diagnosis present

## 2023-03-22 DIAGNOSIS — D696 Thrombocytopenia, unspecified: Secondary | ICD-10-CM | POA: Diagnosis not present

## 2023-03-22 DIAGNOSIS — E669 Obesity, unspecified: Secondary | ICD-10-CM | POA: Diagnosis not present

## 2023-03-22 DIAGNOSIS — I272 Pulmonary hypertension, unspecified: Secondary | ICD-10-CM | POA: Diagnosis present

## 2023-03-22 DIAGNOSIS — E1151 Type 2 diabetes mellitus with diabetic peripheral angiopathy without gangrene: Secondary | ICD-10-CM | POA: Diagnosis present

## 2023-03-22 DIAGNOSIS — K642 Third degree hemorrhoids: Secondary | ICD-10-CM | POA: Diagnosis present

## 2023-03-22 DIAGNOSIS — I5032 Chronic diastolic (congestive) heart failure: Secondary | ICD-10-CM | POA: Diagnosis not present

## 2023-03-22 DIAGNOSIS — N186 End stage renal disease: Secondary | ICD-10-CM | POA: Diagnosis not present

## 2023-03-22 DIAGNOSIS — I340 Nonrheumatic mitral (valve) insufficiency: Secondary | ICD-10-CM | POA: Diagnosis present

## 2023-03-22 DIAGNOSIS — K2981 Duodenitis with bleeding: Secondary | ICD-10-CM | POA: Diagnosis present

## 2023-03-22 DIAGNOSIS — K635 Polyp of colon: Secondary | ICD-10-CM | POA: Diagnosis present

## 2023-03-22 DIAGNOSIS — I10 Essential (primary) hypertension: Secondary | ICD-10-CM | POA: Diagnosis not present

## 2023-03-22 DIAGNOSIS — K922 Gastrointestinal hemorrhage, unspecified: Secondary | ICD-10-CM | POA: Diagnosis not present

## 2023-03-22 DIAGNOSIS — K625 Hemorrhage of anus and rectum: Secondary | ICD-10-CM

## 2023-03-22 DIAGNOSIS — I132 Hypertensive heart and chronic kidney disease with heart failure and with stage 5 chronic kidney disease, or end stage renal disease: Secondary | ICD-10-CM | POA: Diagnosis present

## 2023-03-22 DIAGNOSIS — E1122 Type 2 diabetes mellitus with diabetic chronic kidney disease: Secondary | ICD-10-CM | POA: Diagnosis present

## 2023-03-22 DIAGNOSIS — Z992 Dependence on renal dialysis: Secondary | ICD-10-CM | POA: Diagnosis not present

## 2023-03-22 DIAGNOSIS — L97129 Non-pressure chronic ulcer of left thigh with unspecified severity: Secondary | ICD-10-CM | POA: Diagnosis present

## 2023-03-22 DIAGNOSIS — E782 Mixed hyperlipidemia: Secondary | ICD-10-CM | POA: Diagnosis present

## 2023-03-22 DIAGNOSIS — E039 Hypothyroidism, unspecified: Secondary | ICD-10-CM | POA: Diagnosis not present

## 2023-03-22 DIAGNOSIS — Z794 Long term (current) use of insulin: Secondary | ICD-10-CM | POA: Diagnosis not present

## 2023-03-22 DIAGNOSIS — D62 Acute posthemorrhagic anemia: Secondary | ICD-10-CM | POA: Diagnosis present

## 2023-03-22 LAB — GLUCOSE, CAPILLARY
Glucose-Capillary: 105 mg/dL — ABNORMAL HIGH (ref 70–99)
Glucose-Capillary: 214 mg/dL — ABNORMAL HIGH (ref 70–99)
Glucose-Capillary: 149 mg/dL — ABNORMAL HIGH (ref 70–99)
Glucose-Capillary: 166 mg/dL — ABNORMAL HIGH (ref 70–99)

## 2023-03-22 LAB — HEPATITIS B SURFACE ANTIGEN: Hepatitis B Surface Ag: NONREACTIVE

## 2023-03-22 LAB — HEMOGLOBIN AND HEMATOCRIT, BLOOD
HCT: 29 % — ABNORMAL LOW (ref 36.0–46.0)
HCT: 31.9 % — ABNORMAL LOW (ref 36.0–46.0)
HCT: 31.2 % — ABNORMAL LOW (ref 36.0–46.0)
Hemoglobin: 9.1 g/dL — ABNORMAL LOW (ref 12.0–15.0)
Hemoglobin: 10 g/dL — ABNORMAL LOW (ref 12.0–15.0)
Hemoglobin: 9.9 g/dL — ABNORMAL LOW (ref 12.0–15.0)

## 2023-03-22 LAB — CBC
HCT: 30.5 % — ABNORMAL LOW (ref 36.0–46.0)
Hemoglobin: 9.7 g/dL — ABNORMAL LOW (ref 12.0–15.0)
MCH: 29.9 pg (ref 26.0–34.0)
MCHC: 31.8 g/dL (ref 30.0–36.0)
MCV: 94.1 fL (ref 80.0–100.0)
Platelets: 106 10*3/uL — ABNORMAL LOW (ref 150–400)
RBC: 3.24 MIL/uL — ABNORMAL LOW (ref 3.87–5.11)
RDW: 15.2 % (ref 11.5–15.5)
WBC: 6.3 10*3/uL (ref 4.0–10.5)
nRBC: 0 % (ref 0.0–0.2)

## 2023-03-22 MED ORDER — MIDODRINE HCL 5 MG PO TABS
10.0000 mg | ORAL_TABLET | Freq: Every day | ORAL | Status: DC
  Administered 2023-03-22 – 2023-03-25 (×4): 10 mg via ORAL
  Filled 2023-03-22 (×4): qty 2

## 2023-03-22 MED ORDER — POLYETHYLENE GLYCOL 3350 17 G PO PACK
17.0000 g | PACK | Freq: Every day | ORAL | Status: DC
  Administered 2023-03-22 – 2023-03-23 (×2): 17 g via ORAL
  Filled 2023-03-22 (×2): qty 1

## 2023-03-22 MED ORDER — CHLORHEXIDINE GLUCONATE CLOTH 2 % EX PADS: 6.0000 | MEDICATED_PAD | Freq: Every day | CUTANEOUS | Status: DC

## 2023-03-22 NOTE — Hospital Course (Signed)
52 y.o. female with medical history significant of type 2 diabetes with nephropathy, hypertension, hypothyroidism, end-stage renal disease on dialysis, GERD and recent admission secondary to lower GI bleed (which she was found on colonoscopy with a polyp); presented to the hospital secondary to bright red blood per rectum and maroon blood clots.  Symptoms has been present for the last 2 days and the patient presented to the hospital for further evaluation and management.   Patient denies chest pain, shortness of breath, dizziness and lightheadedness.   Workup in the ED demonstrated a hemoglobin that has dropped since his last discharge from 11.6-9.6; case was discussed with gastroenterology service who recommends keeping patient n.p.o. for further evaluation and management.     [facility] has been contacted to place in the hospital to further facilitate care.

## 2023-03-22 NOTE — Progress Notes (Signed)
   03/22/23 0823  Vitals  BP (!) 94/50  MAP (mmHg) (!) 64  Pulse Rate 77  Resp 19  Level of Consciousness  Level of Consciousness Alert  MEWS COLOR  MEWS Score Color Green  Oxygen Therapy  SpO2 98 %  O2 Device Room Air  MEWS Score  MEWS Temp 0  MEWS Systolic 1  MEWS Pulse 0  MEWS RR 0  MEWS LOC 0  MEWS Score 1   MD Johnson notified about bp. Patient states she takes midodrine daily at home. MD Laural Benes aware and order placed for midodrine daily.

## 2023-03-22 NOTE — Progress Notes (Signed)
[facility] NEPHROLOGY PROGRESS NOTE  Assessment/ Plan: Pt is a 52 y.o. yo female  with HTN, DM, PAD, diastolic CHF and pulmonary HTN, ESRD- TTS and calciphylaxis, who just had colonoscopy with removal of polyp and hemorrhoids presented with rectal bleeding.  Dialyzes at [facility] TTS 3 hours 45 minutes  EDW 93. HD Bath unknown, Dialyzer unknown, Heparin unknown. Access AVF.  # Rectal bleeding: Hemorrhoidal versus post polypectomy after recent GI procedure.  Hemoglobin is a stable and no BM today.  GI is following.  # ESRD MWF: Status post HD yesterday with 2.5 L ultrafiltration.  Tolerated well.  Plan for next HD tomorrow which can be done outpatient if she is discharged today.  # Anemia due to GI bleed and ESRD: Hemoglobin stable.  Continue ESA.  # CKD-MBD/hyperphosphatemia: Continue Auryxia and monitor lab.  # HTN/volume: Volume status acceptable.  Blood pressure low on midodrine.  Subjective: Seen and examined at bedside.  She reports feeling much better.  Denies nausea, vomiting, chest pain or shortness of breath.  She reports no bowel movement today. Objective Vital signs in last 24 hours: Vitals:   03/21/23 1945 03/21/23 2029 03/22/23 0449 03/22/23 0823  BP: 107/60  (!) 96/53 (!) 94/50  Pulse: 78  77 77  Resp: 15  20 19   Temp:  98.2 F (36.8 C) 98.4 F (36.9 C)   TempSrc:  Oral Oral   SpO2: 99%  99% 98%  Weight: 91.4 kg     Height:       Weight change: 0.5 kg  Intake/Output Summary (Last 24 hours) at 03/22/2023 1113 Last data filed at 03/21/2023 2130 Gross per 24 hour  Intake 3 ml  Output 2500 ml  Net -2497 ml       Labs: RENAL PANEL Recent Labs  Lab 03/15/23 1850 03/16/23 0455 03/17/23 1242 03/20/23 2252 03/21/23 0930  NA 133* 133* 129* 133* 134*  K 4.8 4.6 4.0 5.5* 5.5*  CL 99 100 93* 100 102  CO2 23 22 25  20* 18*  GLUCOSE 187* 157* 107* 127* 112*  BUN 48* 51* 34* 65* 65*  CREATININE 7.13* 7.45* 5.86* 9.86* 10.17*  CALCIUM  8.4* 8.3* 8.3* 8.5* 8.5*  PHOS  --   --   --   --  8.2*  ALBUMIN 4.0  --   --  4.2 3.8    Liver Function Tests: Recent Labs  Lab 03/15/23 1850 03/20/23 2252 03/21/23 0930  AST 15 13*  --   ALT 16 12  --   ALKPHOS 144* 139*  --   BILITOT 0.7 0.9  --   PROT 7.1 7.5  --   ALBUMIN 4.0 4.2 3.8   No results for input(s): "LIPASE", "AMYLASE" in the last 168 hours. No results for input(s): "AMMONIA" in the last 168 hours. CBC: Recent Labs    03/20/23 2252 03/21/23 0457 03/21/23 0930 03/21/23 1905 03/22/23 0100 03/22/23 0713 03/22/23 0939  HGB 10.5*   < > 9.7* 9.8* 9.1* 9.7* 10.0*  MCV 93.1  --   --   --   --  94.1  --    < > = values in this interval not displayed.    Cardiac Enzymes: No results for input(s): "CKTOTAL", "CKMB", "CKMBINDEX", "TROPONINI" in the last 168 hours. CBG: Recent Labs  Lab 03/21/23 0212 03/21/23 0800 03/21/23 1145 03/21/23 2134 03/22/23 0628  GLUCAP 125* 125* 145* 156* 105*    Iron Studies: No results for input(s): "IRON", "TIBC", "TRANSFERRIN", "FERRITIN" in the  last 72 hours. Studies/Results: No results found.  Medications: Infusions:  sodium chloride      Scheduled Medications:  atorvastatin  10 mg Oral Daily   Chlorhexidine Gluconate Cloth  6 each Topical Q0600   ferric citrate  630 mg Oral TID with meals   insulin aspart  0-15 Units Subcutaneous TID WC   insulin aspart  0-5 Units Subcutaneous QHS   insulin detemir  10 Units Subcutaneous QHS   levothyroxine  75 mcg Oral Q0600   midodrine  10 mg Oral Daily   pantoprazole (PROTONIX) IV  40 mg Intravenous Q12H   polyethylene glycol  17 g Oral Daily   sodium chloride flush  3 mL Intravenous Q12H    have reviewed scheduled and prn medications.  Physical Exam: General:NAD, comfortable Heart:RRR, s1s2 nl Lungs:clear b/l, no crackle Abdomen:soft, Non-tender, non-distended Extremities:No edema Dialysis Access: Left upper extremity AV fistula has good thrill and bruit.  Sharol Croghan  Prasad Icyss Skog 03/22/2023,11:13 AM  LOS: 0 days

## 2023-03-22 NOTE — Care Management Obs Status (Signed)
MEDICARE OBSERVATION STATUS NOTIFICATION   Patient Details  Name: Amy Moses MRN: [id number] Date of Birth: [birth year]   Medicare Observation Status Notification Given:  Yes    Corey Harold 03/22/2023, 11:16 AM

## 2023-03-22 NOTE — TOC Progression Note (Addendum)
Transition of Care [facility]) - Progression Note    Patient Details  Name: Amy Moses MRN: [id number] Date of Birth: 02/16/1971  Transition of Care [facility] CM/SW Contact  Leitha Bleak, RN Phone Number: 03/22/2023, 2:28 PM  Clinical Narrative:   Readmission, TOC following for discharge plan. Possible home health if patient is agreeable. Patient continuing work up. Needs HD and GI consult.   Expected Discharge Plan and Services      Social Determinants of Health (SDOH) Interventions SDOH Screenings   Food Insecurity: No Food Insecurity (03/21/2023)  Housing: Low Risk  (03/21/2023)  Transportation Needs: No Transportation Needs (03/21/2023)  Utilities: Not At Risk (03/21/2023)  Depression (PHQ2-9): Low Risk  (04/06/2022)  Tobacco Use: Low Risk  (03/22/2023)    Readmission Risk Interventions    05/02/2022    9:34 AM 01/26/2022    2:26 PM 05/14/2021    2:46 PM  Readmission Risk Prevention Plan  Transportation Screening Complete Complete Complete  PCP or Specialist Appt within 3-5 Days  Complete   HRI or Home Care Consult  Complete   Social Work Consult for Recovery Care Planning/Counseling  Complete   Palliative Care Screening  Not Applicable   Medication Review Oceanographer) Complete Complete Complete  HRI or Home Care Consult Complete  Complete  SW Recovery Care/Counseling Consult Complete  Complete  Palliative Care Screening Not Applicable  Not Applicable  Skilled Nursing Facility Not Applicable  Not Applicable

## 2023-03-22 NOTE — Progress Notes (Addendum)
PROGRESS NOTE   Amy Moses  ZOX:[id number] DOB: [birth year] DOA: 03/20/2023 PCP: Lorelei Pont, DO   Chief Complaint  Patient presents with   Rectal Bleeding   Level of care: Med-Surg  Brief Admission History:  52 y.o. female with medical history significant of type 2 diabetes with nephropathy, hypertension, hypothyroidism, end-stage renal disease on dialysis, GERD and recent admission secondary to lower GI bleed (which she was found on colonoscopy with a polyp); presented to the hospital secondary to bright red blood per rectum and maroon blood clots.  Symptoms has been present for the last 2 days and the patient presented to the hospital for further evaluation and management.   Patient denies chest pain, shortness of breath, dizziness and lightheadedness.   Workup in the ED demonstrated a hemoglobin that has dropped since his last discharge from 11.6-9.6; case was discussed with gastroenterology service who recommends keeping patient n.p.o. for further evaluation and management.     [facility] has been contacted to place in the hospital to further facilitate care.   Assessment and Plan: * GI bleed -Patient presented with lower GI bleed, experiencing hematochezia and passing maroon-colored blood clots. -Recent admission with similar presentation where colonoscopy demonstrated positive polyp status post clipping. -Continue to follow hemoglobin trend and transfuse as needed -GI service consulted with recommendations being followed -monitor for recurrence of bleeding -no BM since arrival -miralax started by GI team 5/1  Thrombocytopenia (HCC) -Chronic and stable -Continue to follow platelet count trend -SCDs for DVT prophylaxis.  GERD (gastroesophageal reflux disease) -continue PPI  Obesity (BMI 30-39.9) -Body mass index is 33.23 kg/m. -Low-calorie diet and portion control discussed with patient.  Chronic ulcer of left thigh (HCC) -Secondary to calciphylaxis -No signs of  superimposed infection appreciated -Continue to follow stability.  Chronic diastolic heart failure (HCC) -Stable and compensated -Continue volume management with dialysis.  ESRD (end stage renal disease) on dialysis [facility]) -Nephrology consulted to facilitate inpatient dialysis -Patient schedule Tuesday, Thursday and Saturday -She has not missed any dialysis treatment lately. -stable renal panel.  Acquired hypothyroidism -Continue Synthroid.  Primary hypertension -Stable and well-controlled -Continue to follow vital sign -Patient was just following diet control and dialysis to maintain blood pressure.  Mixed hyperlipidemia -Continue statin.  Type 2 diabetes mellitus with ESRD (end-stage renal disease) (HCC) -Resume sliding scale insulin and Semglee -Follow CBGs fluctuation. CBG (last 3)  Recent Labs    03/21/23 2134 03/22/23 0628 03/22/23 1129  GLUCAP 156* 105* 214*     DVT prophylaxis: SCD Code Status: Full  Family Communication:    Consultants:  GI, nephrology  Procedures:   Antimicrobials:    Subjective: Reports no BM since arrival.  Objective: Vitals:   03/21/23 1945 03/21/23 2029 03/22/23 0449 03/22/23 0823  BP: 107/60  (!) 96/53 (!) 94/50  Pulse: 78  77 77  Resp: 15  20 19   Temp:  98.2 F (36.8 C) 98.4 F (36.9 C)   TempSrc:  Oral Oral   SpO2: 99%  99% 98%  Weight: 91.4 kg     Height:        Intake/Output Summary (Last 24 hours) at 03/22/2023 1259 Last data filed at 03/21/2023 2130 Gross per 24 hour  Intake 3 ml  Output 2500 ml  Net -2497 ml   Filed Weights   03/21/23 0219 03/21/23 1502 03/21/23 1945  Weight: 93.4 kg 93.9 kg 91.4 kg   Examination:  General exam: frail, chronically ill appearing female, Appears calm and comfortable  Respiratory system: Clear to  auscultation. Respiratory effort normal. Cardiovascular system: normal S1 & S2 heard. No JVD, murmurs, rubs, gallops or clicks. No pedal edema. Gastrointestinal system: Abdomen is  nondistended, soft and nontender. No organomegaly or masses felt. Normal bowel sounds heard. Central nervous system: Alert and oriented. No focal neurological deficits. Extremities: Symmetric 5 x 5 power. Skin: No rashes, lesions or ulcers. Psychiatry: Judgement and insight appear normal. Mood & affect appropriate.   Data Reviewed: I have personally reviewed following labs and imaging studies  CBC: Recent Labs  Lab 03/15/23 1850 03/16/23 0455 03/17/23 0419 03/20/23 2252 03/21/23 0457 03/21/23 1905 03/22/23 0100 03/22/23 0713 03/22/23 0939 03/22/23 1240  WBC 7.0 6.2 6.2 8.8  --   --   --  6.3  --   --   NEUTROABS 5.1  --   --   --   --   --   --   --   --   --   HGB 11.6* 10.8* 10.6* 10.5*   < > 9.8* 9.1* 9.7* 10.0* 9.9*  HCT 37.6 35.3* 34.0* 32.6*   < > 30.7* 29.0* 30.5* 31.9* 31.2*  MCV 95.9 95.4 93.7 93.1  --   --   --  94.1  --   --   PLT 103* 100* 108* 127*  --   --   --  106*  --   --    < > = values in this interval not displayed.    Basic Metabolic Panel: Recent Labs  Lab 03/15/23 1850 03/16/23 0455 03/17/23 1242 03/20/23 2252 03/21/23 0930  NA 133* 133* 129* 133* 134*  K 4.8 4.6 4.0 5.5* 5.5*  CL 99 100 93* 100 102  CO2 23 22 25  20* 18*  GLUCOSE 187* 157* 107* 127* 112*  BUN 48* 51* 34* 65* 65*  CREATININE 7.13* 7.45* 5.86* 9.86* 10.17*  CALCIUM 8.4* 8.3* 8.3* 8.5* 8.5*  PHOS  --   --   --   --  8.2*    CBG: Recent Labs  Lab 03/21/23 0800 03/21/23 1145 03/21/23 2134 03/22/23 0628 03/22/23 1129  GLUCAP 125* 145* 156* 105* 214*    No results found for this or any previous visit (from the past 240 hour(s)).   Radiology Studies: No results found.  Scheduled Meds:  atorvastatin  10 mg Oral Daily   Chlorhexidine Gluconate Cloth  6 each Topical Q0600   ferric citrate  630 mg Oral TID with meals   insulin aspart  0-15 Units Subcutaneous TID WC   insulin aspart  0-5 Units Subcutaneous QHS   insulin detemir  10 Units Subcutaneous QHS    levothyroxine  75 mcg Oral Q0600   midodrine  10 mg Oral Daily   pantoprazole (PROTONIX) IV  40 mg Intravenous Q12H   polyethylene glycol  17 g Oral Daily   sodium chloride flush  3 mL Intravenous Q12H   Continuous Infusions:  sodium chloride       LOS: 0 days   Time spent: 39 mins  Hermann Dottavio Laural Benes, MD How to contact the [facility] Attending or Consulting provider 7A - 7P or covering provider during after hours 7P -7A, for this patient?  Check the care team in [facility] and look for a) attending/consulting [facility] provider listed and b) the [facility] team listed Log into [email or web address] and use [facility]'s universal password to access. If you do not have the password, please contact the hospital operator. Locate the [facility] provider you are looking for under [facility] and page to a  number that you can be directly reached. If you still have difficulty reaching the provider, please page the [facility] (Director on Call) for the Hospitalists listed on amion for assistance.  03/22/2023, 12:59 PM

## 2023-03-22 NOTE — Progress Notes (Signed)
During assessment patient requested SCDs for better circulation. Patient already has ace wrap on lower extremities for swelling.

## 2023-03-22 NOTE — Progress Notes (Signed)
Subjective: Patient denies any further rectal bleeding. No BMs thus far, felt she needed to go earlier but did not. Denies abdominal pain, nausea or vomiting. No SOB or dizziness. She has constipation at baseline, takes miralax at home, has not had anything for constipation since admission.   Objective: Vital signs in last 24 hours: Temp:  [98.2 F (36.8 C)-98.5 F (36.9 C)] 98.4 F (36.9 C) (05/01 0449) Pulse Rate:  [76-80] 77 (05/01 0823) Resp:  [14-20] 19 (05/01 0823) BP: (94-118)/(50-70) 94/50 (05/01 0823) SpO2:  [98 %-100 %] 98 % (05/01 0823) Weight:  [91.4 kg-93.9 kg] 91.4 kg (04/30 1945) Last BM Date : 03/21/23 General:   Alert and oriented, pleasant Head:  Normocephalic and atraumatic. Eyes:  No icterus, sclera clear. Conjuctiva pink.  Mouth:  Without lesions, mucosa pink and moist.  Heart:  S1, S2 present, no murmurs noted.  Lungs: Clear to auscultation bilaterally, without wheezing, rales, or rhonchi.  Abdomen:  Bowel sounds present, soft, non-tender, non-distended. No HSM or hernias noted. No rebound or guarding. No masses appreciated  Msk:  Symmetrical without gross deformities. Normal posture. Pulses:  Normal pulses noted. Extremities:  Without clubbing or edema. Neurologic:  Alert and  oriented x4;  grossly normal neurologically. Skin:  Warm and dry, intact without significant lesions.  Psych:  Alert and cooperative. Normal mood and affect.  Intake/Output from previous day: 04/30 0701 - 05/01 0700 In: 3 [I.V.:3] Out: 2500   Lab Results: Recent Labs    03/20/23 2252 03/21/23 0457 03/21/23 1905 03/22/23 0100 03/22/23 0713  WBC 8.8  --   --   --  6.3  HGB 10.5*   < > 9.8* 9.1* 9.7*  HCT 32.6*   < > 30.7* 29.0* 30.5*  PLT 127*  --   --   --  106*   < > = values in this interval not displayed.   BMET Recent Labs    03/20/23 2252 03/21/23 0930  NA 133* 134*  K 5.5* 5.5*  CL 100 102  CO2 20* 18*  GLUCOSE 127* 112*  BUN 65* 65*  CREATININE 9.86*  10.17*  CALCIUM 8.5* 8.5*   LFT Recent Labs    03/20/23 2252 03/21/23 0930  PROT 7.5  --   ALBUMIN 4.2 3.8  AST 13*  --   ALT 12  --   ALKPHOS 139*  --   BILITOT 0.9  --    PT/INR No results for input(s): "LABPROT", "INR" in the last 72 hours. Hepatitis Panel Recent Labs    03/22/23 0100  HEPBSAG NON REACTIVE   Assessment: Amy Moses is a 52 year old female with medical history significant of blindness of the right eye, chronic diastolic CHF, diabetes mellitus type 2, dyslipidemia, hypertension, hypothyroidism, ESRD on HD, pulmonary hypertension, colon polyps with history of post polypectomy bleed, recent admission on 03/16/2023 for rectal bleeding with colonoscopy revealing grade 3 hemorrhoids and 10 mm ascending colon polyp with hemorrhagic appearance/overlying clot s/p hot snare polypectomy and hemostasis clip x 1.  returned to ED 4/29 with rectal bleeding, shortness of breath, fatigue.   Rectal Bleeding: hemorrhoidal vs post polypectomy bleed. Recent admission and colonoscopy as above. Began having rectal bleeding Monday with 2 episodes BRBPR with clots. Reports similar bleeding history prior to TCS. Hgb stable on admission, BP has been soft, no NSAID use. Receives heparin at dialysis with last session on Thursday. Hgb on admission 10.5, declined down to 9.1 but back to 10 this morning. No further rectal bleeding,  no BMs this admission, last was Monday, she takes generic miralax at home. Will start miralax 17g daily. Will continue to monitor, if further episodes of rectal bleeding will proceed with repeat Colonoscopy on Thursday. Has appt on 5/22 for possible hemorrhoid banding, May need to consider stopping Heparin at dialysis until banding has been done, could potentially try to get her in sooner than 5/22 if we have openings.   Plan: Trend h&h Renal diet Hemorrhoid banding as outpatient (can try to move up sooner than 5/22) Monitor for overt GI bleeding Repeat colonoscopy  if rebleeding occurs Start miralax 17g daily    LOS: 0 days    03/22/2023, 9:30 AM  Avigdor Dollar L. Jeanmarie Hubert, MSN, APRN, AGNP-C Adult-Gerontology Nurse Practitioner [facility]astroenter[facility] at [name]

## 2023-03-23 DIAGNOSIS — K922 Gastrointestinal hemorrhage, unspecified: Secondary | ICD-10-CM | POA: Diagnosis not present

## 2023-03-23 DIAGNOSIS — K625 Hemorrhage of anus and rectum: Secondary | ICD-10-CM

## 2023-03-23 LAB — GLUCOSE, CAPILLARY
Glucose-Capillary: 139 mg/dL — ABNORMAL HIGH (ref 70–99)
Glucose-Capillary: 183 mg/dL — ABNORMAL HIGH (ref 70–99)
Glucose-Capillary: 138 mg/dL — ABNORMAL HIGH (ref 70–99)
Glucose-Capillary: 161 mg/dL — ABNORMAL HIGH (ref 70–99)

## 2023-03-23 LAB — RENAL FUNCTION PANEL
Albumin: 3.5 g/dL (ref 3.5–5.0)
Anion gap: 12 (ref 5–15)
BUN: 50 mg/dL — ABNORMAL HIGH (ref 6–20)
CO2: 22 mmol/L (ref 22–32)
Calcium: 8.6 mg/dL — ABNORMAL LOW (ref 8.9–10.3)
Chloride: 97 mmol/L — ABNORMAL LOW (ref 98–111)
Creatinine, Ser: 7.7 mg/dL — ABNORMAL HIGH (ref 0.44–1.00)
GFR, Estimated: 6 mL/min — ABNORMAL LOW (ref >60)
Glucose, Bld: 144 mg/dL — ABNORMAL HIGH (ref 70–99)
Phosphorus: 5.6 mg/dL — ABNORMAL HIGH (ref 2.5–4.6)
Potassium: 4.6 mmol/L (ref 3.5–5.1)
Sodium: 131 mmol/L — ABNORMAL LOW (ref 135–145)

## 2023-03-23 LAB — CBC
HCT: 31.4 % — ABNORMAL LOW (ref 36.0–46.0)
Hemoglobin: 9.7 g/dL — ABNORMAL LOW (ref 12.0–15.0)
MCH: 29.4 pg (ref 26.0–34.0)
MCHC: 30.9 g/dL (ref 30.0–36.0)
MCV: 95.2 fL (ref 80.0–100.0)
Platelets: 111 10*3/uL — ABNORMAL LOW (ref 150–400)
RBC: 3.3 MIL/uL — ABNORMAL LOW (ref 3.87–5.11)
RDW: 15.1 % (ref 11.5–15.5)
WBC: 7.3 10*3/uL (ref 4.0–10.5)
nRBC: 0 % (ref 0.0–0.2)

## 2023-03-23 LAB — HEPATITIS B SURFACE ANTIBODY, QUANTITATIVE: Hep B S AB Quant (Post): <3.5 m[IU]/mL — ABNORMAL LOW (ref >9.9)

## 2023-03-23 MED ORDER — POLYETHYLENE GLYCOL 3350 17 G PO PACK
17.0000 g | PACK | Freq: Two times a day (BID) | ORAL | Status: DC
  Administered 2023-03-23 – 2023-03-24 (×2): 17 g via ORAL
  Filled 2023-03-23 (×3): qty 1

## 2023-03-23 MED ORDER — LINACLOTIDE 145 MCG PO CAPS
145.0000 ug | ORAL_CAPSULE | Freq: Once | ORAL | Status: AC
  Administered 2023-03-23: 145 ug via ORAL
  Filled 2023-03-23: qty 1

## 2023-03-23 NOTE — Progress Notes (Signed)
Gastroenterology Progress Note   Referring Provider: No ref. provider found Primary Care Physician:  Lorelei Pont, DO Patient ID: [name]; [id number]; 19-Dec-1970   Subjective:    Patient states last BM was Monday. No further bleeding reported.  Objective:   Vital signs in last 24 hours: Temp:  [97.8 F (36.6 C)-99.3 F (37.4 C)] 97.8 F (36.6 C) (05/02 0522) Pulse Rate:  [75-80] 75 (05/02 0522) Resp:  [16-18] 18 (05/02 0522) BP: (106-117)/(63-66) 106/66 (05/02 0522) SpO2:  [96 %-98 %] 97 % (05/02 0522) Last BM Date : 03/21/23 General:   Alert,  Well-developed, well-nourished, pleasant and cooperative in NAD Head:  Normocephalic and atraumatic. Eyes:  Sclera clear, no icterus.  Abdomen:  Soft, nontender and nondistended. Normal bowel sounds, without guarding, and without rebound.   Neurologic:  Alert and  oriented x4;  grossly normal neurologically. Skin:  Intact without significant lesions or rashes. Psych:  Alert and cooperative. Normal mood and affect.  Intake/Output from previous day: No intake/output data recorded. Intake/Output this shift: No intake/output data recorded.  Lab Results: CBC Recent Labs    03/20/23 2252 03/21/23 0457 03/22/23 0713 03/22/23 0939 03/22/23 1240 03/23/23 0513  WBC 8.8  --  6.3  --   --  7.3  HGB 10.5*   < > 9.7* 10.0* 9.9* 9.7*  HCT 32.6*   < > 30.5* 31.9* 31.2* 31.4*  MCV 93.1  --  94.1  --   --  95.2  PLT 127*  --  106*  --   --  111*   < > = values in this interval not displayed.   BMET Recent Labs    03/20/23 2252 03/21/23 0930 03/23/23 0513  NA 133* 134* 131*  K 5.5* 5.5* 4.6  CL 100 102 97*  CO2 20* 18* 22  GLUCOSE 127* 112* 144*  BUN 65* 65* 50*  CREATININE 9.86* 10.17* 7.70*  CALCIUM 8.5* 8.5* 8.6*   LFTs Recent Labs    03/20/23 2252 03/21/23 0930 03/23/23 0513  BILITOT 0.9  --   --   ALKPHOS 139*  --   --   AST 13*  --   --   ALT 12  --   --   PROT 7.5  --   --   ALBUMIN 4.2 3.8 3.5    No results for input(s): "LIPASE" in the last 72 hours. PT/INR No results for input(s): "LABPROT", "INR" in the last 72 hours.       Imaging Studies: No results found.[2 weeks]  Assessment:    Ysabella Babiarz is a 52 year old female with medical history significant of blindness of the right eye, chronic diastolic CHF, diabetes mellitus type 2, dyslipidemia, hypertension, hypothyroidism, ESRD on HD, pulmonary hypertension, colon polyps with history of post polypectomy bleed, recent admission on 03/16/2023 for rectal bleeding with colonoscopy revealing grade 3 hemorrhoids and 10 mm ascending colon polyp with hemorrhagic appearance/overlying clot s/p hot snare polypectomy and hemostasis clip x 1.  returned to ED 4/29 with rectal bleeding, shortness of breath, fatigue.   Rectal Bleeding: Likely hemorrhoidal. Recent admission and colonoscopy as above. Began having rectal bleeding Monday with 2 episodes BRBPR with clots. Reports similar bleeding history prior to TCS. Hgb stable on admission, BP has been soft, no NSAID use. Receives heparin at dialysis with last session on Thursday. Hgb on admission 10.5, declined down to 9.1 but back to 9.7 this morning. No further rectal bleeding, no BMs this admission, last was Monday, she takes  generic miralax at home. Will start miralax 17g daily. Has appt on 5/22 for possible hemorrhoid banding, May need to consider stopping Heparin at dialysis until banding has been done, could potentially try to get her in sooner than 5/22 if we have openings.      Plan:   Increase Miralax to 17 grams twice daily to maintain soft stool.  Will given her dose of Linzess when she returns from dialysis to get stools moving.  Outpatient follow up for possible hemorrhoid banding.    LOS: 1 day   Leanna Battles. Dixon Boos [facility] [phone] 5/2/20248:41 AM

## 2023-03-23 NOTE — Telephone Encounter (Signed)
Tobi Bastos: Is Wed 5/8 ok? It is a new pt slot.

## 2023-03-23 NOTE — Telephone Encounter (Signed)
Please try to get patient on Anna's banding schedule for next week.

## 2023-03-23 NOTE — Progress Notes (Signed)
   HEMODIALYSIS TREATMENT NOTE:  3.5 hour heparin-free treatment completed using left upper arm AVF (15g/antegrade). Goal met: 2 liters removed without interruption in UF.  Hemodynamically stable throughout session but soft BPs at the end. All blood was returned and hemostasis was achieved in 20 minutes.  Post-dialysis:  03/23/23 2130  Vitals  Temp 98.1 F (36.7 C)  Temp Source Oral  BP 102/60  MAP (mmHg) 70  BP Location Right Arm  BP Method Automatic  Patient Position (if appropriate) Lying  Pulse Rate 78  Pulse Rate Source Monitor  Resp 16  Oxygen Therapy  SpO2 99 %  O2 Device Room Air  Post Treatment  Dialyzer Clearance Lightly streaked  Duration of HD Treatment -hour(s) 3.5 hour(s)  Hemodialysis Intake (mL) 0 mL  Liters Processed 84.1  Fluid Removed (mL) 2000 mL  Tolerated HD Treatment Yes  Post-Hemodialysis Comments Goal met  AVG/AVF Arterial Site Held (minutes) 10 minutes  AVG/AVF Venous Site Held (minutes) 10 minutes  Fistula / Graft Left Upper arm Arteriovenous fistula  No placement date or time found.   Placed prior to admission: Yes  Orientation: Left  Access Location: Upper arm  Access Type: Arteriovenous fistula  Fistula / Graft Assessment Thrill;Bruit  Status Patent   Arman Filter, RN AP K[facility]

## 2023-03-23 NOTE — Progress Notes (Addendum)
[facility] NEPHROLOGY PROGRESS NOTE  Assessment/ Plan: Pt is a 52 y.o. yo female  with HTN, DM, PAD, diastolic CHF and pulmonary HTN, ESRD- TTS and calciphylaxis, who just had colonoscopy with removal of polyp and hemorrhoids presented with rectal bleeding.  Dialyzes at [facility] TTS 3 hours 45 minutes  EDW 93. HD Bath unknown, Dialyzer unknown, Heparin unknown. Access AVF.  # Rectal bleeding: Hemorrhoidal versus post polypectomy after recent GI procedure.  No further bleeding, seen by GI.  # ESRD TTS: Plan for HD today to continue regular schedule. NO HEPARIN WITH HD UNTIL CLEARED BY GI.  # Anemia due to GI bleed and ESRD: Hemoglobin stable.  Continue ESA.  # CKD-MBD/hyperphosphatemia: Continue Auryxia and monitor lab.  # HTN/volume: Volume status acceptable.  Blood pressure better on midodrine.  Volume acceptable  Subjective: Seen and examined at bedside.  No new event.  She denies nausea, vomiting, chest pain, shortness of breath.   Objective Vital signs in last 24 hours: Vitals:   03/22/23 0823 03/22/23 1526 03/22/23 2023 03/23/23 0522  BP: (!) 94/50 117/65 107/63 106/66  Pulse: 77 80 79 75  Resp: 19 16 18 18   Temp:  98.2 F (36.8 C) 99.3 F (37.4 C) 97.8 F (36.6 C)  TempSrc:  Oral    SpO2: 98% 98% 96% 97%  Weight:      Height:       Weight change:   Intake/Output Summary (Last 24 hours) at 03/23/2023 1107 Last data filed at 03/23/2023 0900 Gross per 24 hour  Intake 360 ml  Output --  Net 360 ml        Labs: RENAL PANEL Recent Labs  Lab 03/17/23 1242 03/20/23 2252 03/21/23 0930 03/23/23 0513  NA 129* 133* 134* 131*  K 4.0 5.5* 5.5* 4.6  CL 93* 100 102 97*  CO2 25 20* 18* 22  GLUCOSE 107* 127* 112* 144*  BUN 34* 65* 65* 50*  CREATININE 5.86* 9.86* 10.17* 7.70*  CALCIUM 8.3* 8.5* 8.5* 8.6*  PHOS  --   --  8.2* 5.6*  ALBUMIN  --  4.2 3.8 3.5     Liver Function Tests: Recent Labs  Lab 03/20/23 2252 03/21/23 0930  03/23/23 0513  AST 13*  --   --   ALT 12  --   --   ALKPHOS 139*  --   --   BILITOT 0.9  --   --   PROT 7.5  --   --   ALBUMIN 4.2 3.8 3.5    No results for input(s): "LIPASE", "AMYLASE" in the last 168 hours. No results for input(s): "AMMONIA" in the last 168 hours. CBC: Recent Labs    03/22/23 0100 03/22/23 0713 03/22/23 0939 03/22/23 1240 03/23/23 0513  HGB 9.1* 9.7* 10.0* 9.9* 9.7*  MCV  --  94.1  --   --  95.2     Cardiac Enzymes: No results for input(s): "CKTOTAL", "CKMB", "CKMBINDEX", "TROPONINI" in the last 168 hours. CBG: Recent Labs  Lab 03/22/23 0628 03/22/23 1129 03/22/23 1625 03/22/23 2107 03/23/23 0723  GLUCAP 105* 214* 149* 166* 139*     Iron Studies: No results for input(s): "IRON", "TIBC", "TRANSFERRIN", "FERRITIN" in the last 72 hours. Studies/Results: No results found.  Medications: Infusions:  sodium chloride      Scheduled Medications:  atorvastatin  10 mg Oral Daily   Chlorhexidine Gluconate Cloth  6 each Topical Q0600   ferric citrate  630 mg Oral TID with meals   insulin  aspart  0-15 Units Subcutaneous TID WC   insulin aspart  0-5 Units Subcutaneous QHS   insulin detemir  10 Units Subcutaneous QHS   levothyroxine  75 mcg Oral Q0600   midodrine  10 mg Oral Daily   pantoprazole (PROTONIX) IV  40 mg Intravenous Q12H   polyethylene glycol  17 g Oral Daily   sodium chloride flush  3 mL Intravenous Q12H    have reviewed scheduled and prn medications.  Physical Exam: General:NAD, comfortable Heart:RRR, s1s2 nl Lungs:clear b/l, no crackle Abdomen:soft, Non-tender, non-distended Extremities:No peripheral edema Dialysis Access: Left upper extremity AV fistula has good thrill and bruit.  Amy Moses 03/23/2023,11:07 AM  LOS: 1 day

## 2023-03-23 NOTE — Telephone Encounter (Signed)
Routing to [employer]. Darl Pikes on vacation.

## 2023-03-23 NOTE — Progress Notes (Signed)
PROGRESS NOTE    Amy Moses  ZOX:[id number] DOB: 05-15-1971 DOA: 03/20/2023 PCP: Lorelei Pont, DO   Brief Narrative:  This 52 yrs old female with medical history significant of type 2 diabetes with nephropathy, hypertension, hypothyroidism, end-stage renal disease on dialysis, GERD and recent admission secondary to lower GI bleed (which she was found on colonoscopy with a polyp); presented to the hospital secondary to bright red blood per rectum and maroon blood clots.  Symptoms has been present for the last 2 days and the patient presented to the hospital for further evaluation and management.  He Hb has dropped from 11.6 > 9.6.  Case was discussed with gastroenterology recommended NPO for further evaluation and management.  Nephrology consulted for continuation of hemodialysis.  Assessment & Plan:   Principal Problem:   GI bleed Active Problems:   ESRD (end stage renal disease) on dialysis (HCC)   Type 2 diabetes mellitus with ESRD (end-stage renal disease) (HCC)   Acquired hypothyroidism   Mixed hyperlipidemia   Primary hypertension   Chronic diastolic heart failure (HCC)   Acute blood loss anemia   Chronic ulcer of left thigh (HCC)   Obesity (BMI 30-39.9)   GERD (gastroesophageal reflux disease)   Thrombocytopenia (HCC)   Hematochezia  GI bleed/bleeding per rectum: Patient presented with lower GI bleed, experiencing hematochezia and passing maroon-colored blood clots. She was recently admitted with similar presentation where colonoscopy demonstrated positive polyp status post clipping. Monitor H&H and transfuse as needed. Gastroenterology consulted. Patient has no bowel movement since arrival. GI started patient on MiraLAX.   Thrombocytopenia (HCC) Chronic and stable. SCDs for DVT prophylaxis.   GERD (gastroesophageal reflux disease) Continue pantoprazole.   Obesity (BMI 30-39.9) Body mass index is 33.23 kg/m. Low-calorie diet and portion control discussed with  patient.   Chronic ulcer of left thigh (HCC) Secondary to calciphylaxis. No signs of superimposed infection appreciated. Continue to follow stability.   Chronic diastolic heart failure (HCC) Stable and compensated Continue volume management with dialysis.   ESRD (end stage renal disease) on dialysis [facility]) Nephrology consulted to facilitate inpatient dialysis Continue HD on Tuesday, Thursday and Saturday She has not missed any dialysis treatment lately.   Acquired hypothyroidism Continue Synthroid.   Primary hypertension Stable and well-controlled. Diet Controlled.   Mixed hyperlipidemiaContinue statin.   Type 2 diabetes mellitus with ESRD (end-stage renal disease) (HCC) Continue Sliding scale insulin and Semglee -Follow CBGs fluctuation.  DVT prophylaxis: SCDs Code Status: Full code Family Communication: No family at bed side. Disposition Plan:   Status is: Inpatient Remains inpatient appropriate because:   Admitted for further GI workup.   Consultants:  Gastroenterology Nephrology  Procedures: None Antimicrobials: None  Subjective: Patient was seen and examined at bedside.  Overnight events noted.   She denies any further bleeding in the stools.  States she has not had a bowel movement yet.   She feels better, denies any shortness of breath.  Objective: Vitals:   03/22/23 0823 03/22/23 1526 03/22/23 2023 03/23/23 0522  BP: (!) 94/50 117/65 107/63 106/66  Pulse: 77 80 79 75  Resp: 19 16 18 18   Temp:  98.2 F (36.8 C) 99.3 F (37.4 C) 97.8 F (36.6 C)  TempSrc:  Oral    SpO2: 98% 98% 96% 97%  Weight:      Height:        Intake/Output Summary (Last 24 hours) at 03/23/2023 1304 Last data filed at 03/23/2023 0900 Gross per 24 hour  Intake 360 ml  Output --  Net 360 ml   Filed Weights   03/21/23 0219 03/21/23 1502 03/21/23 1945  Weight: 93.4 kg 93.9 kg 91.4 kg    Examination:  General exam: Appears calm and comfortable, not in any acute  distress.  Deconditioned Respiratory system: Clear to auscultation. Respiratory effort normal.  RR 15 Cardiovascular system: S1 & S2 heard, RRR. No JVD, murmurs, rubs, gallops or clicks. No pedal edema. Gastrointestinal system: Abdomen is soft, Non tender, Non distended, BS+ Central nervous system: Alert and oriented x 3. No focal neurological deficits. Extremities: No edema, no cyanosis, no clubbing Skin: No rashes, lesions or ulcers Psychiatry: Judgement and insight appear normal. Mood & affect appropriate.     Data Reviewed: I have personally reviewed following labs and imaging studies  CBC: Recent Labs  Lab 03/17/23 0419 03/20/23 2252 03/21/23 0457 03/22/23 0100 03/22/23 0713 03/22/23 0939 03/22/23 1240 03/23/23 0513  WBC 6.2 8.8  --   --  6.3  --   --  7.3  HGB 10.6* 10.5*   < > 9.1* 9.7* 10.0* 9.9* 9.7*  HCT 34.0* 32.6*   < > 29.0* 30.5* 31.9* 31.2* 31.4*  MCV 93.7 93.1  --   --  94.1  --   --  95.2  PLT 108* 127*  --   --  106*  --   --  111*   < > = values in this interval not displayed.   Basic Metabolic Panel: Recent Labs  Lab 03/17/23 1242 03/20/23 2252 03/21/23 0930 03/23/23 0513  NA 129* 133* 134* 131*  K 4.0 5.5* 5.5* 4.6  CL 93* 100 102 97*  CO2 25 20* 18* 22  GLUCOSE 107* 127* 112* 144*  BUN 34* 65* 65* 50*  CREATININE 5.86* 9.86* 10.17* 7.70*  CALCIUM 8.3* 8.5* 8.5* 8.6*  PHOS  --   --  8.2* 5.6*   GFR: Estimated Creatinine Clearance: 10.1 mL/min (A) (by C-G formula based on SCr of 7.7 mg/dL (H)). Liver Function Tests: Recent Labs  Lab 03/20/23 2252 03/21/23 0930 03/23/23 0513  AST 13*  --   --   ALT 12  --   --   ALKPHOS 139*  --   --   BILITOT 0.9  --   --   PROT 7.5  --   --   ALBUMIN 4.2 3.8 3.5   No results for input(s): "LIPASE", "AMYLASE" in the last 168 hours. No results for input(s): "AMMONIA" in the last 168 hours. Coagulation Profile: No results for input(s): "INR", "PROTIME" in the last 168 hours. Cardiac Enzymes: No  results for input(s): "CKTOTAL", "CKMB", "CKMBINDEX", "TROPONINI" in the last 168 hours. BNP (last 3 results) No results for input(s): "PROBNP" in the last 8760 hours. HbA1C: No results for input(s): "HGBA1C" in the last 72 hours. CBG: Recent Labs  Lab 03/22/23 1129 03/22/23 1625 03/22/23 2107 03/23/23 0723 03/23/23 1116  GLUCAP 214* 149* 166* 139* 183*   Lipid Profile: No results for input(s): "CHOL", "HDL", "LDLCALC", "TRIG", "CHOLHDL", "LDLDIRECT" in the last 72 hours. Thyroid Function Tests: No results for input(s): "TSH", "T4TOTAL", "FREET4", "T3FREE", "THYROIDAB" in the last 72 hours. Anemia Panel: No results for input(s): "VITAMINB12", "FOLATE", "FERRITIN", "TIBC", "IRON", "RETICCTPCT" in the last 72 hours. Sepsis Labs: No results for input(s): "PROCALCITON", "LATICACIDVEN" in the last 168 hours.  No results found for this or any previous visit (from the past 240 hour(s)).   Radiology Studies: No results found.  Scheduled Meds:  atorvastatin  10 mg Oral Daily   Chlorhexidine  Gluconate Cloth  6 each Topical Q0600   ferric citrate  630 mg Oral TID with meals   insulin aspart  0-15 Units Subcutaneous TID WC   insulin aspart  0-5 Units Subcutaneous QHS   insulin detemir  10 Units Subcutaneous QHS   levothyroxine  75 mcg Oral Q0600   linaclotide  145 mcg Oral Once   midodrine  10 mg Oral Daily   pantoprazole (PROTONIX) IV  40 mg Intravenous Q12H   polyethylene glycol  17 g Oral BID   sodium chloride flush  3 mL Intravenous Q12H   Continuous Infusions:  sodium chloride       LOS: 1 day    Time spent: 50 mins    Willeen Niece, MD [facility]   If 7PM-7AM, please contact night-coverage

## 2023-03-23 NOTE — Telephone Encounter (Signed)
First available time slot is on 04/13/2023

## 2023-03-24 DIAGNOSIS — K922 Gastrointestinal hemorrhage, unspecified: Secondary | ICD-10-CM | POA: Diagnosis not present

## 2023-03-24 LAB — RENAL FUNCTION PANEL
Albumin: 3.2 g/dL — ABNORMAL LOW (ref 3.5–5.0)
Anion gap: 11 (ref 5–15)
BUN: 35 mg/dL — ABNORMAL HIGH (ref 6–20)
CO2: 26 mmol/L (ref 22–32)
Calcium: 8.7 mg/dL — ABNORMAL LOW (ref 8.9–10.3)
Chloride: 96 mmol/L — ABNORMAL LOW (ref 98–111)
Creatinine, Ser: 5.41 mg/dL — ABNORMAL HIGH (ref 0.44–1.00)
GFR, Estimated: 9 mL/min — ABNORMAL LOW (ref >60)
Glucose, Bld: 183 mg/dL — ABNORMAL HIGH (ref 70–99)
Phosphorus: 3.9 mg/dL (ref 2.5–4.6)
Potassium: 3.8 mmol/L (ref 3.5–5.1)
Sodium: 133 mmol/L — ABNORMAL LOW (ref 135–145)

## 2023-03-24 LAB — GLUCOSE, CAPILLARY
Glucose-Capillary: 145 mg/dL — ABNORMAL HIGH (ref 70–99)
Glucose-Capillary: 180 mg/dL — ABNORMAL HIGH (ref 70–99)
Glucose-Capillary: 205 mg/dL — ABNORMAL HIGH (ref 70–99)
Glucose-Capillary: 150 mg/dL — ABNORMAL HIGH (ref 70–99)
Glucose-Capillary: 160 mg/dL — ABNORMAL HIGH (ref 70–99)

## 2023-03-24 LAB — CBC
HCT: 30.6 % — ABNORMAL LOW (ref 36.0–46.0)
Hemoglobin: 9.5 g/dL — ABNORMAL LOW (ref 12.0–15.0)
MCH: 29.4 pg (ref 26.0–34.0)
MCHC: 31 g/dL (ref 30.0–36.0)
MCV: 94.7 fL (ref 80.0–100.0)
Platelets: 108 10*3/uL — ABNORMAL LOW (ref 150–400)
RBC: 3.23 MIL/uL — ABNORMAL LOW (ref 3.87–5.11)
RDW: 14.9 % (ref 11.5–15.5)
WBC: 7.4 10*3/uL (ref 4.0–10.5)
nRBC: 0 % (ref 0.0–0.2)

## 2023-03-24 LAB — MAGNESIUM: Magnesium: 1.9 mg/dL (ref 1.7–2.4)

## 2023-03-24 LAB — PHOSPHORUS: Phosphorus: 3.8 mg/dL (ref 2.5–4.6)

## 2023-03-24 MED ORDER — CHLORHEXIDINE GLUCONATE CLOTH 2 % EX PADS
6.0000 | MEDICATED_PAD | Freq: Every day | CUTANEOUS | Status: DC
  Administered 2023-03-25: 6 via TOPICAL

## 2023-03-24 NOTE — Progress Notes (Signed)
PROGRESS NOTE    Amy Moses  NWG:[id number] DOB: 11-09-1971 DOA: 03/20/2023 PCP: Lorelei Pont, DO   Brief Narrative:  This 52 yrs old female with medical history significant of type 2 diabetes with nephropathy, hypertension, hypothyroidism, end-stage renal disease on dialysis, GERD and recent admission secondary to lower GI bleed (which she was found on colonoscopy with a polyp); presented to the hospital secondary to bright red blood per rectum and maroon blood clots.  Symptoms has been present for the last 2 days and the patient presented to the hospital for further evaluation and management.  He Hb has dropped from 11.6 > 9.6.  Case was discussed with gastroenterology recommended NPO for further evaluation and management.  Nephrology consulted for continuation of hemodialysis.  Assessment & Plan:   Principal Problem:   GI bleed Active Problems:   ESRD (end stage renal disease) on dialysis (HCC)   Type 2 diabetes mellitus with ESRD (end-stage renal disease) (HCC)   Acquired hypothyroidism   Mixed hyperlipidemia   Primary hypertension   Chronic diastolic heart failure (HCC)   Acute blood loss anemia   Chronic ulcer of left thigh (HCC)   Obesity (BMI 30-39.9)   GERD (gastroesophageal reflux disease)   Thrombocytopenia (HCC)   Hematochezia  GI bleed / bleeding per rectum: Patient presented with lower GI bleed, experiencing hematochezia and passing maroon-colored blood clots. She was recently admitted with similar presentation where colonoscopy demonstrated positive polyp status post clipping. Monitor H&H and transfuse as needed. Gastroenterology consulted.  Bleeding is from internal hemorrhoids. Patient has no bowel movement since arrival. GI started patient on MiraLAX. GI is planning to schedule possible hemorrhoidal banding next week.   Thrombocytopenia (HCC) Chronic and stable. SCDs for DVT prophylaxis.   GERD (gastroesophageal reflux disease) Continue  pantoprazole.   Obesity (BMI 30-39.9) Body mass index is 33.23 kg/m. Low-calorie diet and portion control discussed with patient.   Chronic ulcer of left thigh (HCC) Secondary to calciphylaxis. No signs of superimposed infection appreciated. Continue to follow stability.   Chronic diastolic heart failure (HCC) Stable and compensated Continue volume management with dialysis.   ESRD (end stage renal disease) on dialysis [name]) Nephrology consulted to facilitate inpatient dialysis. Continue HD on Tuesday, Thursday and Saturday She has not missed any dialysis treatment lately.   Acquired hypothyroidism Continue Synthroid.   Primary hypertension Stable and well-controlled. Diet Controlled.   Mixed hyperlipidemiaContinue statin.   Type 2 diabetes mellitus with ESRD (end-stage renal disease) (HCC) Continue Sliding scale insulin and Semglee -Follow CBGs fluctuation.  DVT prophylaxis: SCDs Code Status: Full code Family Communication: No family at bed side. Disposition Plan:   Status is: Inpatient Remains inpatient appropriate because:   Patient admitted with bleeding per rectum.  Likely internal hemorrhoidal bleed.  GI is consulted planning to schedule outpatient hemorrhoidal banding. Continued on hemodialysis.  Anticipated discharge home tomorrow   Consultants:  Gastroenterology Nephrology  Procedures: None Antimicrobials: None  Subjective: Patient was seen and examined at bedside.  Overnight events noted.   She denies any further bleeding in the stools.  She had just a little bowel movement, denies any blood in it. She feels better, denies any shortness of breath.  Objective: Vitals:   03/23/23 2130 03/23/23 2231 03/24/23 0109 03/24/23 0508  BP: 102/60 109/62 128/81 105/67  Pulse: 78 85 80 74  Resp: 16 20 20 20   Temp: 98.1 F (36.7 C) 98.7 F (37.1 C) 98.4 F (36.9 C) 98.2 F (36.8 C)  TempSrc: Oral Axillary  Oral  SpO2: 99% 94% 99% 96%  Weight:       Height:        Intake/Output Summary (Last 24 hours) at 03/24/2023 1224 Last data filed at 03/23/2023 2130 Gross per 24 hour  Intake --  Output 2000 ml  Net -2000 ml   Filed Weights   03/21/23 1502 03/21/23 1945 03/23/23 1650  Weight: 93.9 kg 91.4 kg 92.2 kg    Examination:  General exam: Appears comfortable, not in any acute distress, deconditioned. Respiratory system: CTA bilaterally.Marland Kitchen Respiratory effort normal.  RR 15 Cardiovascular system: S1 & S2 heard, RRR. No JVD, murmurs, rubs, gallops or clicks.  Gastrointestinal system: Abdomen is soft, Non tender, Non distended, BS+ Central nervous system: Alert and oriented x 3. No focal neurological deficits. Extremities: No edema, no cyanosis, no clubbing Skin: No rashes, lesions or ulcers Psychiatry: Judgement and insight appear normal. Mood & affect appropriate.     Data Reviewed: I have personally reviewed following labs and imaging studies  CBC: Recent Labs  Lab 03/20/23 2252 03/21/23 0457 03/22/23 0713 03/22/23 0939 03/22/23 1240 03/23/23 0513 03/24/23 0446  WBC 8.8  --  6.3  --   --  7.3 7.4  HGB 10.5*   < > 9.7* 10.0* 9.9* 9.7* 9.5*  HCT 32.6*   < > 30.5* 31.9* 31.2* 31.4* 30.6*  MCV 93.1  --  94.1  --   --  95.2 94.7  PLT 127*  --  106*  --   --  111* 108*   < > = values in this interval not displayed.   Basic Metabolic Panel: Recent Labs  Lab 03/17/23 1242 03/20/23 2252 03/21/23 0930 03/23/23 0513 03/24/23 0446 03/24/23 0447  NA 129* 133* 134* 131*  --  133*  K 4.0 5.5* 5.5* 4.6  --  3.8  CL 93* 100 102 97*  --  96*  CO2 25 20* 18* 22  --  26  GLUCOSE 107* 127* 112* 144*  --  183*  BUN 34* 65* 65* 50*  --  35*  CREATININE 5.86* 9.86* 10.17* 7.70*  --  5.41*  CALCIUM 8.3* 8.5* 8.5* 8.6*  --  8.7*  MG  --   --   --   --  1.9  --   PHOS  --   --  8.2* 5.6* 3.8 3.9   GFR: Estimated Creatinine Clearance: 14.4 mL/min (A) (by C-G formula based on SCr of 5.41 mg/dL (H)). Liver Function Tests: Recent  Labs  Lab 03/20/23 2252 03/21/23 0930 03/23/23 0513 03/24/23 0447  AST 13*  --   --   --   ALT 12  --   --   --   ALKPHOS 139*  --   --   --   BILITOT 0.9  --   --   --   PROT 7.5  --   --   --   ALBUMIN 4.2 3.8 3.5 3.2*   No results for input(s): "LIPASE", "AMYLASE" in the last 168 hours. No results for input(s): "AMMONIA" in the last 168 hours. Coagulation Profile: No results for input(s): "INR", "PROTIME" in the last 168 hours. Cardiac Enzymes: No results for input(s): "CKTOTAL", "CKMB", "CKMBINDEX", "TROPONINI" in the last 168 hours. BNP (last 3 results) No results for input(s): "PROBNP" in the last 8760 hours. HbA1C: No results for input(s): "HGBA1C" in the last 72 hours. CBG: Recent Labs  Lab 03/23/23 1641 03/23/23 2233 03/24/23 0116 03/24/23 0734 03/24/23 1133  GLUCAP 138* 161* 145* 180* 205*  Lipid Profile: No results for input(s): "CHOL", "HDL", "LDLCALC", "TRIG", "CHOLHDL", "LDLDIRECT" in the last 72 hours. Thyroid Function Tests: No results for input(s): "TSH", "T4TOTAL", "FREET4", "T3FREE", "THYROIDAB" in the last 72 hours. Anemia Panel: No results for input(s): "VITAMINB12", "FOLATE", "FERRITIN", "TIBC", "IRON", "RETICCTPCT" in the last 72 hours. Sepsis Labs: No results for input(s): "PROCALCITON", "LATICACIDVEN" in the last 168 hours.  No results found for this or any previous visit (from the past 240 hour(s)).   Radiology Studies: No results found.  Scheduled Meds:  atorvastatin  10 mg Oral Daily   Chlorhexidine Gluconate Cloth  6 each Topical Q0600   Chlorhexidine Gluconate Cloth  6 each Topical Q0600   ferric citrate  630 mg Oral TID with meals   insulin aspart  0-15 Units Subcutaneous TID WC   insulin aspart  0-5 Units Subcutaneous QHS   insulin detemir  10 Units Subcutaneous QHS   levothyroxine  75 mcg Oral Q0600   midodrine  10 mg Oral Daily   pantoprazole (PROTONIX) IV  40 mg Intravenous Q12H   polyethylene glycol  17 g Oral BID    sodium chloride flush  3 mL Intravenous Q12H   Continuous Infusions:  sodium chloride       LOS: 2 days    Time spent: 35 mins    Willeen Niece, MD [facility]   If 7PM-7AM, please contact night-coverage

## 2023-03-24 NOTE — Progress Notes (Signed)
[facility] NEPHROLOGY PROGRESS NOTE  Assessment/ Plan: Pt is a 52 y.o. yo female  with HTN, DM, PAD, diastolic CHF and pulmonary HTN, ESRD- TTS and calciphylaxis, who just had colonoscopy with removal of polyp and hemorrhoids presented with rectal bleeding.  Dialyzes at [facility] TTS 3 hours 45 minutes  EDW 93. HD Bath unknown, Dialyzer unknown, Heparin unknown. Access AVF.  # Rectal bleeding: Hemorrhoidal versus post polypectomy after recent GI procedure.  No further bleeding, seen by GI.  # ESRD TTS: Status postdialysis yesterday with 2 L ultrafiltration, tolerated well.  Plan for next HD tomorrow which can be done outpatient if patient is discharged today.  NO HEPARIN WITH HD UNTIL CLEARED BY GI.  # Anemia due to GI bleed and ESRD: Hemoglobin stable.  Continue ESA.  # CKD-MBD/hyperphosphatemia: Continue Auryxia and monitor lab.  # HTN/volume: Volume status acceptable.  Blood pressure better on midodrine.  Volume acceptable.  Please contact on-call nephrologist with any question or concern over the weekend.  Subjective: Seen and examined at bedside.  No new event.  No BM today.  Denies nausea, vomiting, chest pain, shortness of breath.  No abdominal pain.   Objective Vital signs in last 24 hours: Vitals:   03/23/23 2130 03/23/23 2231 03/24/23 0109 03/24/23 0508  BP: 102/60 109/62 128/81 105/67  Pulse: 78 85 80 74  Resp: 16 20 20 20   Temp: 98.1 F (36.7 C) 98.7 F (37.1 C) 98.4 F (36.9 C) 98.2 F (36.8 C)  TempSrc: Oral Axillary  Oral  SpO2: 99% 94% 99% 96%  Weight:      Height:       Weight change:   Intake/Output Summary (Last 24 hours) at 03/24/2023 1106 Last data filed at 03/23/2023 2130 Gross per 24 hour  Intake --  Output 2000 ml  Net -2000 ml        Labs: RENAL PANEL Recent Labs  Lab 03/17/23 1242 03/20/23 2252 03/21/23 0930 03/23/23 0513 03/24/23 0446 03/24/23 0447  NA 129* 133* 134* 131*  --  133*  K 4.0 5.5* 5.5* 4.6   --  3.8  CL 93* 100 102 97*  --  96*  CO2 25 20* 18* 22  --  26  GLUCOSE 107* 127* 112* 144*  --  183*  BUN 34* 65* 65* 50*  --  35*  CREATININE 5.86* 9.86* 10.17* 7.70*  --  5.41*  CALCIUM 8.3* 8.5* 8.5* 8.6*  --  8.7*  MG  --   --   --   --  1.9  --   PHOS  --   --  8.2* 5.6* 3.8 3.9  ALBUMIN  --  4.2 3.8 3.5  --  3.2*     Liver Function Tests: Recent Labs  Lab 03/20/23 2252 03/21/23 0930 03/23/23 0513 03/24/23 0447  AST 13*  --   --   --   ALT 12  --   --   --   ALKPHOS 139*  --   --   --   BILITOT 0.9  --   --   --   PROT 7.5  --   --   --   ALBUMIN 4.2 3.8 3.5 3.2*    No results for input(s): "LIPASE", "AMYLASE" in the last 168 hours. No results for input(s): "AMMONIA" in the last 168 hours. CBC: Recent Labs    03/22/23 0713 03/22/23 0939 03/22/23 1240 03/23/23 0513 03/24/23 0446  HGB 9.7* 10.0* 9.9* 9.7* 9.5*  MCV 94.1  --   --  95.2 94.7     Cardiac Enzymes: No results for input(s): "CKTOTAL", "CKMB", "CKMBINDEX", "TROPONINI" in the last 168 hours. CBG: Recent Labs  Lab 03/23/23 1116 03/23/23 1641 03/23/23 2233 03/24/23 0116 03/24/23 0734  GLUCAP 183* 138* 161* 145* 180*     Iron Studies: No results for input(s): "IRON", "TIBC", "TRANSFERRIN", "FERRITIN" in the last 72 hours. Studies/Results: No results found.  Medications: Infusions:  sodium chloride      Scheduled Medications:  atorvastatin  10 mg Oral Daily   Chlorhexidine Gluconate Cloth  6 each Topical Q0600   ferric citrate  630 mg Oral TID with meals   insulin aspart  0-15 Units Subcutaneous TID WC   insulin aspart  0-5 Units Subcutaneous QHS   insulin detemir  10 Units Subcutaneous QHS   levothyroxine  75 mcg Oral Q0600   midodrine  10 mg Oral Daily   pantoprazole (PROTONIX) IV  40 mg Intravenous Q12H   polyethylene glycol  17 g Oral BID   sodium chloride flush  3 mL Intravenous Q12H    have reviewed scheduled and prn medications.  Physical Exam: General:NAD,  comfortable Heart:RRR, s1s2 nl Lungs:clear b/l, no crackle Abdomen:soft, Non-tender, non-distended Extremities:No peripheral edema Dialysis Access: Left upper extremity AV fistula has good thrill and bruit.  Kyrra Prada Jaynie Collins 03/24/2023,11:06 AM  LOS: 2 days

## 2023-03-24 NOTE — Progress Notes (Signed)
Gastroenterology Progress Note   Referring Provider: No ref. provider found Primary Care Physician:  Lorelei Pont, DO Primary Gastroenterologist:  Dr. Jena Gauss  Patient ID: Amy Moses; [id number]; 1971-05-05    Subjective   Days of struggle with oral constipation however denies any further rectal bleeding..  Denies any abdominal pain. + flatus  Objective   Vital signs in last 24 hours Temp:  [98 F (36.7 C)-98.7 F (37.1 C)] 98.2 F (36.8 C) (05/03 0508) Pulse Rate:  [72-85] 74 (05/03 0508) Resp:  [15-20] 20 (05/03 0508) BP: (102-128)/(60-81) 105/67 (05/03 0508) SpO2:  [94 %-99 %] 96 % (05/03 0508) Weight:  [92.2 kg] 92.2 kg (05/02 1650) Last BM Date : 03/23/23  Physical Exam General:   Alert and oriented, pleasant Head:  Normocephalic and atraumatic. Eyes:  No icterus, sclera clear. Conjuctiva pink.   Abdomen:  Bowel sounds present, soft, non-tender, non-distended. No HSM or hernias noted. No rebound or guarding. No masses appreciated  Psych:  Alert and cooperative. Normal mood and affect.  Intake/Output from previous day: 05/02 0701 - 05/03 0700 In: 360 [P.O.:360] Out: 2000  Intake/Output this shift: No intake/output data recorded.  Lab Results  Recent Labs    03/22/23 0713 03/22/23 0939 03/22/23 1240 03/23/23 0513 03/24/23 0446  WBC 6.3  --   --  7.3 7.4  HGB 9.7*   < > 9.9* 9.7* 9.5*  HCT 30.5*   < > 31.2* 31.4* 30.6*  PLT 106*  --   --  111* 108*   < > = values in this interval not displayed.   BMET Recent Labs    03/23/23 0513 03/24/23 0447  NA 131* 133*  K 4.6 3.8  CL 97* 96*  CO2 22 26  GLUCOSE 144* 183*  BUN 50* 35*  CREATININE 7.70* 5.41*  CALCIUM 8.6* 8.7*   LFT Recent Labs    03/23/23 0513 03/24/23 0447  ALBUMIN 3.5 3.2*   PT/INR No results for input(s): "LABPROT", "INR" in the last 72 hours. Hepatitis Panel Recent Labs    03/22/23 0100  HEPBSAG NON REACTIVE    Studies/Results No results found.  Assessment  52  y.o. 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 She returned to the ED 4/29 with rectal bleeding, fatigue, and shortness of breath.   Rectal bleeding: Recent admission with colonoscopy performed which revealed grade 3 hemorrhoids and 10 mm ascending colon polyp with hemorrhagic appearance in presence of overlying clot s/p hot snare and clip placement.  She reported recurrent rectal bleeding this past Monday with clots.  Her BP was reported as soft and she denies any NSAID use.  She did note she has been receiving Epogen with dialysis.  Typically taking MiraLAX at home to help with constipation.  She had a regular appointment for hemorrhoid banding 04/12/2023 with Lewie Loron.  I contacted the office today and reported the ringing was moved up to 5/8 at 8 AM.  Patient notified of date and time of office visit today.  Also encouraged patient to notify dialysis center to not give her heparin until after she has completed banding. Advised that she will need more than 1 session.  Plan / Recommendations  Follow up outpatient 03/29/23 for hemorrhoid banding Miralax 17g BID Need to stop heparin at dialysis until banding completed. Patient needs to notify dialysis.     LOS: 2 days    03/24/2023, 2:10 PM   Brooke Bonito, MSN, FNP-BC, AGACNP-BC [facility]

## 2023-03-24 NOTE — Care Management Important Message (Signed)
Important Message  Patient Details  Name: Amy Moses MRN: [id number] Date of Birth: 22-Feb-1971   Medicare Important Message Given:  N/A - LOS <3 / Initial given by admissions     Corey Harold 03/24/2023, 9:59 AM

## 2023-03-24 NOTE — TOC Progression Note (Signed)
Transition of Care [name]) - Progression Note    Patient Details  Name: Amy Moses MRN: [id number] Date of Birth: February 25, 1971  Transition of Care [facility]) CM/SW Contact  Leitha Bleak, RN Phone Number: 03/24/2023, 2:58 PM  Clinical Narrative:   spoke with Clydie Braun at [facility], Patient is active. They will need resumption of care home health orders. MD updated. TOC following.    Expected Discharge Plan: Home w Home Health Services Barriers to Discharge: Continued Medical Work up  Expected Discharge Plan and Services         Social Determinants of Health (SDOH) Interventions SDOH Screenings   Food Insecurity: No Food Insecurity (03/21/2023)  Housing: Low Risk  (03/21/2023)  Transportation Needs: No Transportation Needs (03/21/2023)  Utilities: Not At Risk (03/21/2023)  Depression (PHQ2-9): Low Risk  (04/06/2022)  Tobacco Use: Low Risk  (03/22/2023)    Readmission Risk Interventions    03/24/2023    2:57 PM 05/02/2022    9:34 AM 01/26/2022    2:26 PM  Readmission Risk Prevention Plan  Transportation Screening Complete Complete Complete  PCP or Specialist Appt within 3-5 Days Not Complete  Complete  HRI or Home Care Consult   Complete  Social Work Consult for Recovery Care Planning/Counseling Complete  Complete  Palliative Care Screening Not Applicable  Not Applicable  Medication Review Oceanographer) Complete Complete Complete  HRI or Home Care Consult  Complete   SW Recovery Care/Counseling Consult  Complete   Palliative Care Screening  Not Applicable   Skilled Nursing Facility  Not Applicable

## 2023-03-25 DIAGNOSIS — N186 End stage renal disease: Secondary | ICD-10-CM | POA: Diagnosis not present

## 2023-03-25 DIAGNOSIS — K922 Gastrointestinal hemorrhage, unspecified: Secondary | ICD-10-CM | POA: Diagnosis not present

## 2023-03-25 DIAGNOSIS — I5032 Chronic diastolic (congestive) heart failure: Secondary | ICD-10-CM | POA: Diagnosis not present

## 2023-03-25 DIAGNOSIS — E669 Obesity, unspecified: Secondary | ICD-10-CM | POA: Diagnosis not present

## 2023-03-25 LAB — GLUCOSE, CAPILLARY
Glucose-Capillary: 182 mg/dL — ABNORMAL HIGH (ref 70–99)
Glucose-Capillary: 250 mg/dL — ABNORMAL HIGH (ref 70–99)

## 2023-03-25 LAB — RENAL FUNCTION PANEL
Albumin: 3.4 g/dL — ABNORMAL LOW (ref 3.5–5.0)
Anion gap: 10 (ref 5–15)
BUN: 50 mg/dL — ABNORMAL HIGH (ref 6–20)
CO2: 24 mmol/L (ref 22–32)
Calcium: 9.1 mg/dL (ref 8.9–10.3)
Chloride: 97 mmol/L — ABNORMAL LOW (ref 98–111)
Creatinine, Ser: 6.45 mg/dL — ABNORMAL HIGH (ref 0.44–1.00)
GFR, Estimated: 7 mL/min — ABNORMAL LOW (ref >60)
Glucose, Bld: 190 mg/dL — ABNORMAL HIGH (ref 70–99)
Phosphorus: 4.9 mg/dL — ABNORMAL HIGH (ref 2.5–4.6)
Potassium: 4.4 mmol/L (ref 3.5–5.1)
Sodium: 131 mmol/L — ABNORMAL LOW (ref 135–145)

## 2023-03-25 NOTE — Plan of Care (Signed)
  Problem: Education: Goal: Knowledge of General Education information will improve Description: Including pain rating scale, medication(s)/side effects and non-pharmacologic comfort measures Outcome: Adequate for Discharge   

## 2023-03-25 NOTE — Progress Notes (Signed)
   HEMODIALYSIS TREATMENT NOTE:  Uneventful 3.5 hour heparin-free treatment completed using left upper arm AVF (15g/antegrade).  UF was limited by hypotension.  1 L goal met.  All blood was returned and hemostasis was achieved in 20 minutes.  Post-dialysis:  03/25/23 1645  Vital Signs  Temp 98 F (36.7 C)  Temp Source Oral  Pulse Rate 73  Pulse Rate Source Monitor  Resp 18  BP 112/68  BP Location Right Arm  BP Method Automatic  Patient Position (if appropriate) Sitting  Oxygen Therapy  SpO2 99 %  O2 Device Room Air  Pain Assessment  Pain Score 0  Dialysis Weight  Weight 96.4 kg  Type of Weight Post-Dialysis  Post Treatment  Dialyzer Clearance Lightly streaked  Duration of HD Treatment -hour(s) 3.5 hour(s)  Hemodialysis Intake (mL) 0 mL  Liters Processed 84.1  Fluid Removed (mL) 1000 mL  Tolerated HD Treatment Yes  Post-Hemodialysis Comments Adjusted goal was met  AVG/AVF Arterial Site Held (minutes) 10 minutes  AVG/AVF Venous Site Held (minutes) 10 minutes  Fistula / Graft Left Upper arm Arteriovenous fistula  No placement date or time found.   Placed prior to admission: Yes  Orientation: Left  Access Location: Upper arm  Access Type: Arteriovenous fistula  Site Condition No complications  Fistula / Graft Assessment Thrill;Bruit  Status Patent   Arman Filter, RN AP [facility]

## 2023-03-25 NOTE — TOC Progression Note (Signed)
Transition of Care [facility]) - Progression Note    Patient Details  Name: Amy Moses MRN: [id number] Date of Birth: [birth year]  Transition of Care [facility]) CM/SW Contact  Catalina Gravel, LCSW Phone Number: 03/25/2023, 2:50 PM  Clinical Narrative:    Pt DC today, CSW contacted [facility] and advised of DC. Orders in and pt previously active.    Expected Discharge Plan: Home w Home Health Services Barriers to Discharge: No Barriers Identified  Expected Discharge Plan and Services         Expected Discharge Date: 03/25/23                                     Social Determinants of Health (SDOH) Interventions SDOH Screenings   Food Insecurity: No Food Insecurity (03/21/2023)  Housing: Low Risk  (03/21/2023)  Transportation Needs: No Transportation Needs (03/21/2023)  Utilities: Not At Risk (03/21/2023)  Depression (PHQ2-9): Low Risk  (04/06/2022)  Tobacco Use: Low Risk  (03/22/2023)    Readmission Risk Interventions    03/24/2023    2:57 PM 05/02/2022    9:34 AM 01/26/2022    2:26 PM  Readmission Risk Prevention Plan  Transportation Screening Complete Complete Complete  PCP or Specialist Appt within 3-5 Days Not Complete  Complete  HRI or Home Care Consult   Complete  Social Work Consult for Recovery Care Planning/Counseling Complete  Complete  Palliative Care Screening Not Applicable  Not Applicable  Medication Review Oceanographer) Complete Complete Complete  HRI or Home Care Consult  Complete   SW Recovery Care/Counseling Consult  Complete   Palliative Care Screening  Not Applicable   Skilled Nursing Facility  Not Applicable

## 2023-03-25 NOTE — Discharge Summary (Signed)
Physician Discharge Summary   Patient: Amy Moses MRN: [id number] DOB: [birth year]  Admit date:     03/20/2023  Discharge date: 03/25/23  Discharge Physician: Onalee Hua Emillie Chasen   PCP: Lorelei Pont, DO   Recommendations at discharge:   Please follow up with primary care provider within 1-2 weeks  Please repeat BMP and CBC in one week    Hospital Course: This 52 yrs old female with medical history significant of type 2 diabetes with nephropathy, hypertension, hypothyroidism, end-stage renal disease on dialysis, GERD and recent admission secondary to lower GI bleed (which she was found on colonoscopy with a polyp); presented to the hospital secondary to bright red blood per rectum and maroon blood clots.  Symptoms has been present for the last 2 days and the patient presented to the hospital for further evaluation and management.  He Hb has dropped from 11.6 > 9.6.  Case was discussed with gastroenterology recommended NPO for further evaluation and management.  Nephrology consulted for continuation of hemodialysis.  Notably, the patient had recent hospital admission from 03/15/2023 to 03/17/2023.  Colonoscopy was performed on 03/17/2023.  It was suspected the patient's hematochezia during that time was more likely related to bleeding internal hemorrhoids but hemorrhagic colon polyp could be contributing factor. GI did not feel the patient needed any further invasive procedures.  The patient's hemoglobin was monitored and remained stable in the mid 9 range.  She did not have any further hematochezia during the hospitalization.  The patient received dialysis during her hospitalization.  Her last dialysis was on 03/25/2023 prior to discharge.  GI arranged for outpatient follow-up on 03/29/2023 for banding of the patient's hemorrhoids.  Assessment and Plan: Hematochezia/Lower GI Bleed Patient presented with lower GI bleed, experiencing hematochezia and passing maroon-colored blood clots. She was recently  admitted with similar presentation where colonoscopy demonstrated positive polyp status post clipping. Monitor H&H and transfuse as needed. Gastroenterology consulted.  Bleeding is from internal hemorrhoids. Patient had a BM 03/24/2023 without any blood GI started patient on MiraLAX. GI is planning to schedule possible hemorrhoidal banding on 03/29/2023 -Hemoglobin remained stable during hospitalization -Hemoglobin 9.5 at the time of discharge   Thrombocytopenia (HCC) Chronic and stable. SCDs for DVT prophylaxis.   GERD (gastroesophageal reflux disease) Continue pantoprazole.   Obesity (BMI 30-39.9) Body mass index is 33.23 kg/m. Low-calorie diet and portion control discussed with patient.   Chronic ulcer of left thigh (HCC) Secondary to calciphylaxis. No signs of superimposed infection appreciated. Continue to follow stability. -pt has once weekly dressing changes at home by [name] -leg dressings changed 03/24/23 in house   Chronic diastolic heart failure (HCC) Stable and compensated Continue volume management with dialysis.   ESRD (end stage renal disease) on dialysis [name]) Nephrology consulted to facilitate inpatient dialysis. Continue HD on Tuesday, Thursday and Saturday She has not missed any dialysis treatment lately. -Dialysis on 03/25/2023 prior to discharge   Acquired hypothyroidism Continue Synthroid.   Primary hypertension Stable and well-controlled. Diet Controlled.   Mixed hyperlipidemiaContinue statin.   Type 2 diabetes mellitus with ESRD (end-stage renal disease) (HCC) Continue Sliding scale insulin and Semglee -Follow CBGs fluctuation. -CBGs remained largely controlled during the hospitalization.      Consultants: GI, renal Procedures performed: none  Disposition: Home Diet recommendation:  Renal diet DISCHARGE MEDICATION: Allergies as of 03/25/2023       Reactions   Ace Inhibitors Cough        Medication List     STOP taking these medications  atorvastatin 10 MG tablet Commonly known as: LIPITOR       TAKE these medications    Accu-Chek Guide Me w/Device Kit 1 Piece by Does not apply route as directed.   Accu-Chek Guide test strip Generic drug: glucose blood Use as instructed   acetaminophen 500 MG tablet Commonly known as: TYLENOL Take 1,000 mg by mouth every 6 (six) hours as needed for moderate pain.   Auryxia 1 GM 210 MG(Fe) tablet Generic drug: ferric citrate Take 630 mg by mouth 2 (two) times daily with a meal.   Benefiber Powd Stir 2 teaspoons of Benefiber Original into 4-8 oz. of beverage or soft food (hot or cold), three times daily. Stir well until dissolved (up to 60 seconds.)   Blood Pressure Monitor Kit TAKE BLOOD PRESSURE DAILY   FreeStyle Libre 2 Reader [name] As directed   [name] 2 Sensor Misc 1 Piece by Does not apply route every 14 (fourteen) days.   HumaLOG KwikPen 100 UNIT/ML KwikPen Generic drug: insulin lispro Inject 5-11 Units into the skin 3 (three) times daily with meals. If eats 50% or more of meal. What changed: additional instructions   insulin glargine 100 UNIT/ML injection Commonly known as: LANTUS Inject 0.2 mLs (20 Units total) into the skin at bedtime. What changed: how much to take   levothyroxine 75 MCG tablet Commonly known as: SYNTHROID Take 75 mcg by mouth daily.   midodrine 10 MG tablet Commonly known as: PROAMATINE Take 10 mg by mouth daily.   pantoprazole 40 MG tablet Commonly known as: Protonix Take 1 tablet (40 mg total) by mouth 2 (two) times daily.   polyethylene glycol powder 17 GM/SCOOP powder Commonly known as: MiraLax Take 17 g by mouth daily.   Vitamin D (Ergocalciferol) 1.25 MG (50000 UNIT) Caps capsule Commonly known as: DRISDOL Take 50,000 Units by mouth every 7 (seven) days. Friday        Follow-up Information     [name] Follow up.   Why: Home health will call to schedule your next visit. Contact information: [address] [place] [address] [phone]                Discharge Exam: [name] Weights   03/21/23 1502 03/21/23 1945 03/23/23 1650  Weight: 93.9 kg 91.4 kg 92.2 kg   HEENT:  [place]/AT, No thrush, no icterus CV:  RRR, no rub, no S3, no S4 Lung:  CTA, no wheeze, no rhonchi Abd:  soft/+BS, NT Ext:  1 +LE edema, no lymphangitis, no synovitis, no rash   Condition at discharge: stable  The results of significant diagnostics from this hospitalization (including imaging, microbiology, ancillary and laboratory) are listed below for reference.   Imaging Studies: No results found.  Microbiology: Results for orders placed or performed during the hospital encounter of 09/30/22  C Difficile Quick Screen w PCR reflex     Status: None   Collection Time: 10/01/22  1:07 AM   Specimen: STOOL  Result Value Ref Range Status   C Diff antigen NEGATIVE NEGATIVE Final   C Diff toxin NEGATIVE NEGATIVE Final   C Diff interpretation No C. difficile detected.  Final    Comment: Performed at [facility], [address], [place], [place] [address]    Labs: CBC: Recent Labs  Lab 03/20/23 2252 03/21/23 0457 03/22/23 0713 03/22/23 0939 03/22/23 1240 03/23/23 0513 03/24/23 0446  WBC 8.8  --  6.3  --   --  7.3 7.4  HGB 10.5*   < >  9.7* 10.0* 9.9* 9.7* 9.5*  HCT 32.6*   < > 30.5* 31.9* 31.2* 31.4* 30.6*  MCV 93.1  --  94.1  --   --  95.2 94.7  PLT 127*  --  106*  --   --  111* 108*   < > = values in this interval not displayed.   Basic Metabolic Panel: Recent Labs  Lab 03/20/23 2252 03/21/23 0930 03/23/23 0513 03/24/23 0446 03/24/23 0447 03/25/23 0543  NA 133* 134* 131*  --  133* 131*  K 5.5* 5.5* 4.6  --  3.8 4.4  CL 100 102 97*  --  96* 97*  CO2 20* 18* 22  --  26 24  GLUCOSE 127* 112* 144*  --  183* 190*  BUN 65* 65* 50*  --  35* 50*  CREATININE 9.86* 10.17* 7.70*  --  5.41* 6.45*  CALCIUM 8.5* 8.5* 8.6*  --  8.7* 9.1  MG  --   --   --  1.9  --   --   PHOS  --  8.2* 5.6*  3.8 3.9 4.9*   Liver Function Tests: Recent Labs  Lab 03/20/23 2252 03/21/23 0930 03/23/23 0513 03/24/23 0447 03/25/23 0543  AST 13*  --   --   --   --   ALT 12  --   --   --   --   ALKPHOS 139*  --   --   --   --   BILITOT 0.9  --   --   --   --   PROT 7.5  --   --   --   --   ALBUMIN 4.2 3.8 3.5 3.2* 3.4*   CBG: Recent Labs  Lab 03/24/23 0734 03/24/23 1133 03/24/23 1617 03/24/23 2214 03/25/23 0744  GLUCAP 180* 205* 150* 160* 182*    Discharge time spent: greater than 30 minutes.  Signed: Catarina Hartshorn, MD [facility] 03/25/2023

## 2023-03-27 ENCOUNTER — Telehealth: Payer: Medicare Other | Admitting: Adult Health

## 2023-03-29 ENCOUNTER — Ambulatory Visit (INDEPENDENT_AMBULATORY_CARE_PROVIDER_SITE_OTHER): Payer: Medicare Other | Admitting: Gastroenterology

## 2023-03-29 ENCOUNTER — Encounter: Payer: Self-pay | Admitting: Gastroenterology

## 2023-03-29 VITALS — BP 99/66 | HR 82 | Temp 97.8°F | Ht 66.0 in | Wt 210.0 lb

## 2023-03-29 DIAGNOSIS — K642 Third degree hemorrhoids: Secondary | ICD-10-CM

## 2023-03-29 NOTE — Progress Notes (Signed)
    [facility] BANDING PROCEDURE NOTE  Aubrey Teer is a 52 y.o. female presenting today for consideration of hemorrhoid banding. Last colonoscopy March 17, 2023 with prominent Grade 3 hemorrhoids, 10 mm polyp. She was recently inpatient with recurrent bleeding felt secondary to known hemorrhoids. She does have an enlarged liver on imaging but no splenomegaly or varices on EGD in past. Suspect underlying congestive hepatopathy. No outright contraindication for banding.    The patient presents with symptomatic grade 3 hemorrhoids, unresponsive to maximal medical therapy, requesting rubber band ligation of his/her hemorrhoidal disease. All risks, benefits, and alternative forms of therapy were described and informed consent was obtained.   The decision was made to band the left lateral internal hemorrhoid, and the [facility] O'Regan System was used to perform band ligation without complication. Digital anorectal examination was then performed to assure proper positioning of the band, and to adjust the banded tissue as required. The patient was discharged home without pain or other issues. Dietary and behavioral recommendations were given, along with follow-up instructions. The patient will return in several weeks for followup and possible additional banding as required.  No complications were encountered and the patient tolerated the procedure well.   Gelene Mink, PhD, ANP-BC [facility]

## 2023-03-29 NOTE — Patient Instructions (Signed)
  Please avoid straining.  You should limit your toilet time to 2-3 minutes at the most.   I recommend Benefiber 2 teaspoons each morning in the beverage of your choice!  Please call me with any concerns or issues!  I will see you in follow-up for additional banding in several weeks.     It was a pleasure to see you today. I want to create trusting relationships with patients and provide genuine, compassionate, and quality care. I truly value your feedback, so please be on the lookout for a survey regarding your visit with me today. I appreciate your time in completing this!    Keierra Nudo W. Noam Karaffa, PhD, ANP-BC [facility]     

## 2023-04-03 ENCOUNTER — Ambulatory Visit: Payer: Medicare Other | Admitting: Orthopedic Surgery

## 2023-04-05 ENCOUNTER — Encounter: Payer: Self-pay | Admitting: Adult Health

## 2023-04-05 ENCOUNTER — Telehealth (INDEPENDENT_AMBULATORY_CARE_PROVIDER_SITE_OTHER): Payer: Medicare Other | Admitting: Adult Health

## 2023-04-05 DIAGNOSIS — R400 Somnolence: Secondary | ICD-10-CM

## 2023-04-05 DIAGNOSIS — I5022 Chronic systolic (congestive) heart failure: Secondary | ICD-10-CM | POA: Diagnosis not present

## 2023-04-05 DIAGNOSIS — I270 Primary pulmonary hypertension: Secondary | ICD-10-CM

## 2023-04-05 DIAGNOSIS — G4733 Obstructive sleep apnea (adult) (pediatric): Secondary | ICD-10-CM | POA: Diagnosis not present

## 2023-04-05 NOTE — Patient Instructions (Addendum)
Begin CPAP At bedtime, goal is to wear all night long for at least 6hr or more  Do not drive if sleepy  Work on healthy weight .  Follow up in 2 months Laree Garron NP and As needed   Follow up in 4 months with Dr. Judeth Horn with PFT (30 min slot -Pulmonary Hypertension).

## 2023-04-05 NOTE — Progress Notes (Signed)
Virtual Visit via Video Note  I connected with [name] on 04/05/23 at  9:30 AM EDT by a video enabled telemedicine application and verified that I am speaking with the correct person using two identifiers.  Location: Patient: Home  Provider: Office    I discussed the limitations of evaluation and management by telemedicine and the availability of in person appointments. The patient expressed understanding and agreed to proceed.  History of Present Illness: 52 year old female seen for pulmonary consult December 16, 2022 for shortness of breath and pulmonary hypertension Medical history significant for end-stage renal disease on dialysis, diabetes, calciphylaxis, diastolic heart failure  Today's video visit visit to discuss sleep study results.  Last visit patient was seen for shortness of breath and pulmonary hypertension.  She has some daytime sleepiness and snoring  and risk factors for sleep apnea.  She was set up for a home sleep study that was completed on February 26, 2023 that showed moderate sleep apnea with AHI at 16.2/hour and SpO2 low at 75%.  We discussed her sleep study results in detail.  Went over treatment options including weight loss and CPAP therapy.  Patient is in agreement to begin CPAP therapy.  Patient education was given on CPAP care and sleep apnea.     Observations/Objective: October 2023 PFT: Ratio normal, FVC 1.97L 51% predicted, DLCO 11.37, 50% predicted   September 2023 chest x-ray significant cardiomegaly, normal pulmonary parenchyma  November 2023 pulmonary perfusion scan normal  Echo: September 2023 transthoracic echocardiogram LVEF 55%, left atrium dilated, RV function mildly reduced, RV size moderately enlarged, RVSP 64 mmHg, tricuspid regurgitant jet velocity 3.75, severe TR  Assessment and Plan: Moderate obstructive sleep apnea.  We discussed her sleep study results in detail went over treatment options including weight loss and CPAP therapy.   Patient begin auto CPAP 5 to 15 cm H2O.  Severe pulmonary hypertension-patient has multiple comorbidities including end-stage renal disease, congestive heart failure moderate to severe mitral valve and tricuspid valve regurgitation and now with newly diagnosed sleep apnea.. For now begin on CPAP therapy.  Continue follow-up with cardiology. Volume management through dialysis. Repeat PFTs on return visit and a follow-up with Dr. Judeth Horn for ongoing pulmonary hypertension management .   Diastolic heart failure, moderate to severe mitral valve regurgitation and severe tricuspid valve regurgitation  Plan  Patient Instructions  Begin CPAP At bedtime, goal is to wear all night long for at least 6hr or more  Do not drive if sleepy  Work on healthy weight .  Follow up in 2 months Ravenna Legore NP and As needed   Follow up in 4 months with Dr. Judeth Horn with PFT (30 min slot -Pulmonary Hypertension).       Follow Up Instructions:    I discussed the assessment and treatment plan with the patient. The patient was provided an opportunity to ask questions and all were answered. The patient agreed with the plan and demonstrated an understanding of the instructions.   The patient was advised to call back or seek an in-person evaluation if the symptoms worsen or if the condition fails to improve as anticipated.  I provided 30  minutes of non-face-to-face time during this encounter.   Rubye Oaks, NP

## 2023-04-05 NOTE — Progress Notes (Signed)
Reviewed and agree with assessment/plan.   Coralyn Helling, MD [name] Pulmonary/Critical Care 04/05/2023, 12:52 PM Pager:  [phone]

## 2023-04-10 ENCOUNTER — Telehealth: Payer: Self-pay | Admitting: Orthopedic Surgery

## 2023-04-10 ENCOUNTER — Encounter: Payer: Self-pay | Admitting: Orthopedic Surgery

## 2023-04-10 ENCOUNTER — Ambulatory Visit (INDEPENDENT_AMBULATORY_CARE_PROVIDER_SITE_OTHER): Payer: Medicare Other | Admitting: Orthopedic Surgery

## 2023-04-10 DIAGNOSIS — I739 Peripheral vascular disease, unspecified: Secondary | ICD-10-CM | POA: Diagnosis not present

## 2023-04-10 DIAGNOSIS — L97911 Non-pressure chronic ulcer of unspecified part of right lower leg limited to breakdown of skin: Secondary | ICD-10-CM

## 2023-04-10 DIAGNOSIS — L97923 Non-pressure chronic ulcer of unspecified part of left lower leg with necrosis of muscle: Secondary | ICD-10-CM

## 2023-04-10 DIAGNOSIS — I890 Lymphedema, not elsewhere classified: Secondary | ICD-10-CM | POA: Diagnosis not present

## 2023-04-10 NOTE — Telephone Encounter (Signed)
Pt in office today per Dr. Lajoyce Corners pt will need to have increase frequency to [facility] for compression wraps. Order sent to [facility]

## 2023-04-10 NOTE — Progress Notes (Signed)
Office Visit Note   Patient: Amy Moses           Date of Birth: Jan 27, 1971           MRN: [id number] Visit Date: 04/10/2023              Requested by: Lorelei Pont, DO [address] [place],  [place] [address] PCP: Lorelei Pont, DO  Chief Complaint  Patient presents with   Left Leg - Follow-up   Right Leg - Follow-up      HPI: Patient is a 52 year old woman with venous and lymphatic insufficiency both lower extremities.  Patient most recently has been hospitalized at [facility].  Patient has truncal lymphedema.  She has been using serial compression weekly exercise elevation and has persistent symptoms.  Assessment & Plan: Visit Diagnoses:  1. Lymphedema   2. Calciphylaxis of right lower extremity with nonhealing ulcer, limited to breakdown of skin (HCC)   3. Calciphylaxis of left lower extremity with nonhealing ulcer with necrosis of muscle (HCC)   4. PVD (peripheral vascular disease) (HCC)     Plan: Will increase her compression wraps to twice a week.  Patient will need lymphedema pumps.  She has failed several months of conservative therapy.  She has tried the entry-level basic lymphedema pump without resolution.  Recommended for her to proceed with a Flexitouch advanced pump that would work better with her.  Follow-Up Instructions: Return in about 4 weeks (around 05/08/2023).   Ortho Exam  Patient is alert, oriented, no adenopathy, well-dressed, normal affect, normal respiratory effort. Examination patient has no plantar ulcers she has some healing venous stasis ulcer on both legs.  She does have increased venous and lymphatic insufficiency swelling the proximal aspect of her calf.  Patient has hyperpigmentation and hyperkeratosis skin breakdown posteriorly in the posterior aspect of her calf there is pitting edema.  Patient has lymphedema extending up to her abdomen.  Imaging: No results found. No images are attached to the encounter.  Labs: Lab Results   Component Value Date   HGBA1C 9.4 (H) 10/01/2022   HGBA1C 11.5 (H) 02/15/2022   HGBA1C 13.0 12/30/2021   ESRSEDRATE 34 (H) 02/14/2022   ESRSEDRATE 40 (H) 08/07/2021   CRP 7.0 (H) 02/14/2022   CRP 11.1 (H) 08/07/2021   REPTSTATUS 05/02/2022 FINAL 04/29/2022   GRAMSTAIN  07/08/2021    RARE WBC PRESENT,BOTH PMN AND MONONUCLEAR RARE GRAM POSITIVE RODS RARE GRAM POSITIVE COCCI IN PAIRS    CULT (A) 04/29/2022    CITROBACTER KOSERI SUSCEPTIBILITIES PERFORMED ON PREVIOUS CULTURE WITHIN THE LAST 5 DAYS. Performed at [facility], [address], [place], [place] [address]    Imelda Pillow [facility]ROBACTER KOSERI 04/28/2022     Lab Results  Component Value Date   ALBUMIN 3.4 (L) 03/25/2023   ALBUMIN 3.2 (L) 03/24/2023   ALBUMIN 3.5 03/23/2023   PREALBUMIN 13.7 (L) 08/07/2021    Lab Results  Component Value Date   MG 1.9 03/24/2023   MG 2.4 10/01/2022   MG 2.1 04/30/2022   Lab Results  Component Value Date   VD25OH 36.9 12/30/2021    Lab Results  Component Value Date   PREALBUMIN 13.7 (L) 08/07/2021      Latest Ref Rng & Units 03/24/2023    4:46 AM 03/23/2023    5:13 AM 03/22/2023   12:40 PM  CBC EXTENDED  WBC 4.0 - 10.5 K/uL 7.4  7.3    RBC 3.87 - 5.11 MIL/uL 3.23  3.30  Hemoglobin 12.0 - 15.0 g/dL 9.5  9.7  9.9   HCT 16.1 - 46.0 % 30.6  31.4  31.2   Platelets 150 - 400 K/uL 108  111       There is no height or weight on file to calculate BMI.  Orders:  No orders of the defined types were placed in this encounter.  No orders of the defined types were placed in this encounter.    Procedures: No procedures performed  Clinical Data: No additional findings.  ROS:  All other systems negative, except as noted in the HPI. Review of Systems  Objective: Vital Signs: LMP 08/22/2015 (Approximate)   Specialty Comments:  No specialty comments available.  PMFS History: Patient Active Problem List   Diagnosis Date Noted   Prolapsed internal hemorrhoids, grade 3  03/29/2023   Hematochezia 03/22/2023   Thrombocytopenia (HCC) 03/21/2023   Rectal bleeding 03/15/2023   Mitral regurgitation 11/16/2022   Pressure injury of skin 10/01/2022   Proctitis 10/01/2022   Pulmonary hypertension, unspecified (HCC) 09/12/2022   GERD (gastroesophageal reflux disease) 04/30/2022   Bacteremia 04/29/2022   Non-adherence to medical treatment 03/16/2022   Cutaneous abscess of right foot    Osteomyelitis of foot (HCC) 02/14/2022   Obesity (BMI 30-39.9) 01/26/2022   Ischemic ulcer of right foot (HCC) 01/24/2022   Normocytic anemia 01/24/2022   Sacral pressure ulcer 01/24/2022   Metabolic acidosis, increased anion gap 01/24/2022   Leg wound, left, sequela 12/10/2021   Open leg wound 09/03/2021   Wound infection    Non-pressure chronic ulcer of right calf limited to breakdown of skin (HCC)    Calciphylaxis of right lower extremity with nonhealing ulcer, limited to breakdown of skin (HCC)    Chronic ulcer of left thigh (HCC) 08/07/2021   Calciphylaxis of left lower extremity with nonhealing ulcer with necrosis of muscle (HCC) 08/07/2021   Lower gastrointestinal bleeding 05/12/2021   Acute GI bleeding 04/24/2021   Acute blood loss anemia 04/23/2021   GI bleed 04/22/2021   Fever 03/31/2021   Chronic diastolic heart failure (HCC) 03/30/2021   ESRD (end stage renal disease) on dialysis (HCC) 11/16/2020   Calciphylaxis 11/06/2020   Non-healing open wound of heel 11/03/2020   Diabetic foot infection (HCC) 11/01/2020   Decubitus ulcer, heel 11/01/2020   Closed nondisplaced fracture of left patella 10/29/2020   Metabolic acidosis 06/11/2020   Acute on chronic renal failure (HCC) 06/10/2020   Anemia of chronic disease 06/10/2020   Acute pericardial effusion 06/10/2020   Chronic kidney disease, stage 4 (severe) (HCC) 03/05/2019   Vitamin D deficiency 01/28/2019   Type 2 diabetes mellitus with ESRD (end-stage renal disease) (HCC) 09/21/2015   Mixed hyperlipidemia  09/21/2015   Primary hypertension 09/21/2015   Acquired hypothyroidism 09/21/2015   Iris bomb 07/31/2012   Secondary angle-closure glaucoma 07/31/2012   Past Medical History:  Diagnosis Date   Anemia    Blindness of right eye with low vision in contralateral eye    s/p victrectomy   Chronic diastolic heart failure (HCC) 03/30/2021   Diabetes mellitus, type II (HCC)    Dyslipidemia    Glaucoma    History of blood transfusion    Hypertension    Hypothyroidism (acquired)    Kidney disease    Stage 5   Mitral regurgitation 11/16/2022   Pneumonia    Pulmonary hypertension, unspecified (HCC) 09/12/2022    Family History  Problem Relation Age of Onset   Heart failure Mother    Heart disease Mother  Diabetes Mother    Kidney disease Mother    Heart failure Father    Diabetes Father    Heart disease Father    Diabetes Brother    Heart failure Maternal Grandmother    Heart failure Maternal Grandfather    Transient ischemic attack Maternal Grandfather    Colon cancer Neg Hx     Past Surgical History:  Procedure Laterality Date   ABDOMINAL AORTOGRAM W/LOWER EXTREMITY Bilateral 12/18/2020   Procedure: ABDOMINAL AORTOGRAM W/LOWER EXTREMITY;  Surgeon: Sherren Kerns, MD;  [facility];  Service: Cardiovascular;  Laterality: Bilateral;   ABDOMINAL AORTOGRAM W/LOWER EXTREMITY Bilateral 01/25/2022   Procedure: ABDOMINAL AORTOGRAM W/LOWER EXTREMITY;  Surgeon: Nada Libman, MD;  [facility]: [facility];  Service: Cardiovascular;  Laterality: Bilateral;   AMPUTATION Right 02/16/2022   Procedure: RIGHT FOOT 5TH RAY AMPUTATION;  Surgeon: Nadara Mustard, MD;  Location: [facility] OR;  Service: Orthopedics;  Laterality: Right;   ANKLE FRACTURE SURGERY Right    AV FISTULA PLACEMENT Left 08/18/2020   Procedure: LEFT ARM BRACHIOCEPHALIC ARTERIOVENOUS (AV) FISTULA CREATION;  Surgeon: Sherren Kerns, MD;  Location: [facility] OR;  Service: Vascular;  Laterality: Left;   BIOPSY   04/24/2021   Procedure: BIOPSY;  Surgeon: Lanelle Bal, DO;  [facility];  Service: Endoscopy;;   CESAREAN SECTION     CHOLECYSTECTOMY     COLONOSCOPY  04/24/2021   Surgeon: Lanelle Bal, DO;  nonbleeding internal hemorrhoids, 1 large (25 mm) pedunculated transverse colon polyp (prolapse type polyp) with adherent clot and stigmata of recent bleed.   COLONOSCOPY WITH PROPOFOL N/A 05/14/2021   Procedure: COLONOSCOPY WITH PROPOFOL;  Surgeon: Corbin Ade, MD;  [facility];  Service: Endoscopy;  Laterality: N/A;   COLONOSCOPY WITH PROPOFOL N/A 03/17/2023   Procedure: COLONOSCOPY WITH PROPOFOL;  Surgeon: Corbin Ade, MD;  [facility];  Service: Endoscopy;  Laterality: N/A;   ESOPHAGOGASTRODUODENOSCOPY (EGD) WITH PROPOFOL N/A 04/24/2021   Surgeon: Lanelle Bal, DO;  duodenal erosions and gastritis biopsied (pathology with peptic duodenitis, reactive gastropathy with erosions/chronic inflammation, negative for H. pylori)   EYE SURGERY     Vatrectomy   HEMOSTASIS CLIP PLACEMENT  05/14/2021   Procedure: HEMOSTASIS CLIP PLACEMENT;  Surgeon: Corbin Ade, MD;  [facility];  Service: Endoscopy;;   IR PERC TUN PERIT CATH WO PORT S&I /IMAG  09/15/2020   IR REMOVAL TUN CV CATH W/O FL  02/19/2021   IR US GUIDE VASC ACCESS RIGHT  09/15/2020   POLYPECTOMY  04/24/2021   Procedure: POLYPECTOMY;  Surgeon: Lanelle Bal, DO;  [facility];  Service: Endoscopy;;   POLYPECTOMY  05/14/2021   Procedure: POLYPECTOMY;  Surgeon: Corbin Ade, MD;  [facility];  Service: Endoscopy;;   POLYPECTOMY  03/17/2023   Procedure: POLYPECTOMY;  Surgeon: Corbin Ade, MD;  [facility];  Service: Endoscopy;;   SKIN SPLIT GRAFT Bilateral 09/03/2021   Procedure: SKIN GRAFT BILATERAL LEGS;  Surgeon: Nadara Mustard, MD;  Location: [facility] OR;  Service: Orthopedics;  Laterality: Bilateral;   SKIN SPLIT GRAFT Left 12/10/2021    Procedure: IRRIGATION AND DEBRIDEMENT LEFT CALF, APPLICATION SPLIT THICKNESS SKIN GRAFT;  Surgeon: Nadara Mustard, MD;  Location: [facility] OR;  Service: Orthopedics;  Laterality: Left;   TOE SURGERY     Social History   Occupational History   Not on file  Tobacco Use   Smoking  status: Never   Smokeless tobacco: Never  Vaping Use   Vaping Use: Never used  Substance and Sexual Activity   Alcohol use: No   Drug use: No   Sexual activity: Yes    Birth control/protection: Condom

## 2023-04-12 ENCOUNTER — Ambulatory Visit: Payer: Medicare Other | Admitting: Gastroenterology

## 2023-04-13 ENCOUNTER — Encounter: Payer: Medicare Other | Admitting: Gastroenterology

## 2023-04-13 ENCOUNTER — Encounter: Payer: Self-pay | Admitting: Gastroenterology

## 2023-04-13 ENCOUNTER — Ambulatory Visit (INDEPENDENT_AMBULATORY_CARE_PROVIDER_SITE_OTHER): Payer: Medicare Other | Admitting: Gastroenterology

## 2023-04-13 VITALS — BP 109/64 | HR 75 | Temp 98.0°F | Ht 66.0 in | Wt 210.0 lb

## 2023-04-13 DIAGNOSIS — K642 Third degree hemorrhoids: Secondary | ICD-10-CM | POA: Diagnosis not present

## 2023-04-13 NOTE — Progress Notes (Signed)
    [facility] BANDING PROCEDURE NOTE  Arnissa Silkwood is a 52 y.o. female presenting today for consideration of hemorrhoid banding. Last colonoscopy March 17, 2023 with prominent Grade 3 hemorrhoids, 10 mm polyp. She was recently inpatient with recurrent bleeding felt secondary to known hemorrhoids. She does have an enlarged liver on imaging but no splenomegaly or varices on EGD in past. Suspect underlying congestive hepatopathy. No outright contraindication for banding. She has had left lateral banding thus far.    The patient presents with symptomatic grade 3 hemorrhoids, unresponsive to maximal medical therapy, requesting rubber band ligation of her hemorrhoidal disease. All risks, benefits, and alternative forms of therapy were described and informed consent was obtained.   The decision was made to band the right posterior internal hemorrhoid, and the [facility] O'Regan System was used to perform band ligation without complication. Digital anorectal examination was then performed to assure proper positioning of the band, and to adjust the banded tissue as required. The patient was discharged home without pain or other issues. Dietary and behavioral recommendations were given, along with follow-up instructions. The patient will return in several weeks for followup and possible additional banding as required.  No complications were encountered and the patient tolerated the procedure well.   Gelene Mink, PhD, ANP-BC [facility]

## 2023-04-13 NOTE — Patient Instructions (Signed)
  Please avoid straining.  You should limit your toilet time to 2-3 minutes at the most.   I recommend Benefiber 2 teaspoons each morning in the beverage of your choice!  Please call me with any concerns or issues!  I will see you in follow-up for additional banding in several weeks.   I enjoyed seeing you again today! I value our relationship and want to provide genuine, compassionate, and quality care. You may receive a survey regarding your visit with me, and I welcome your feedback! Thanks so much for taking the time to complete this. I look forward to seeing you again.      Shy Guallpa W. Taziyah Iannuzzi, PhD, ANP-BC [facility]       

## 2023-04-27 ENCOUNTER — Encounter: Payer: Medicare Other | Admitting: Gastroenterology

## 2023-05-03 ENCOUNTER — Encounter: Payer: Medicare Other | Admitting: Gastroenterology

## 2023-05-04 ENCOUNTER — Telehealth: Payer: Self-pay | Admitting: Orthopedic Surgery

## 2023-05-04 NOTE — Telephone Encounter (Signed)
Amy Moses would like someone to call her with a few bits of information about patient wounds please advise CB # [phone] ask for [name]

## 2023-05-08 ENCOUNTER — Ambulatory Visit: Payer: Medicare Other | Admitting: Orthopedic Surgery

## 2023-05-08 NOTE — Telephone Encounter (Signed)
Pt has an appointment this morning with Dr. Lajoyce Corners.

## 2023-05-09 ENCOUNTER — Telehealth: Payer: Self-pay | Admitting: Orthopedic Surgery

## 2023-05-09 NOTE — Telephone Encounter (Signed)
Amy Moses) called from [facility] called with a report of an open wound near sole of right foot about dime to nickel size. Amy Fiscal is asking for a call back right away. Her secure number is [phone] but left her cell in car so call pt phone at [phone] as soon as possible.

## 2023-05-09 NOTE — Telephone Encounter (Signed)
Lawson Fiscal SW triage nurse in office. She will wrap pt with profore compression wrap twice a week and use silvercel to new area. Pt will keep her appt next week for follow up

## 2023-05-10 ENCOUNTER — Encounter: Payer: Self-pay | Admitting: Family

## 2023-05-10 ENCOUNTER — Other Ambulatory Visit: Payer: Self-pay

## 2023-05-10 ENCOUNTER — Ambulatory Visit (INDEPENDENT_AMBULATORY_CARE_PROVIDER_SITE_OTHER): Payer: Medicare Other | Admitting: Family

## 2023-05-10 DIAGNOSIS — L03115 Cellulitis of right lower limb: Secondary | ICD-10-CM

## 2023-05-10 DIAGNOSIS — L97411 Non-pressure chronic ulcer of right heel and midfoot limited to breakdown of skin: Secondary | ICD-10-CM | POA: Diagnosis not present

## 2023-05-10 DIAGNOSIS — L97911 Non-pressure chronic ulcer of unspecified part of right lower leg limited to breakdown of skin: Secondary | ICD-10-CM | POA: Diagnosis not present

## 2023-05-10 MED ORDER — DOXYCYCLINE HYCLATE 100 MG PO TABS: 100.0000 mg | ORAL_TABLET | Freq: Two times a day (BID) | ORAL | 0 refills | Status: DC

## 2023-05-10 NOTE — Progress Notes (Signed)
Office Visit Note   Patient: Amy Moses           Date of Birth: [birth year]           MRN: [id number] Visit Date: 05/10/2023              Requested by: Lorelei Pont, DO [address] [place],  [place] [address] PCP: Lorelei Pont, DO  Chief Complaint  Patient presents with   Right Leg - Wound Check   Left Leg - Wound Check      HPI: The patient is a 52 year old woman with history of calciphylaxis as well as right fifth ray amputation who has been receiving home health nursing for a report they noticed a new ulcer to the plantar aspect of the right foot just a few days ago she did have a Profore dressing placed yesterday.  Notes some redness of her foot denies fevers or chills  Assessment & Plan: Visit Diagnoses: No diagnosis found.  Plan: Radiographs reassuring today.  Will place silver cell in the wound bed and reapply 4-layer compression wrap she will follow-up in 1 week hope to see resolution of the cellulitis  Follow-Up Instructions: No follow-ups on file.   Ortho Exam  Patient is alert, oriented, no adenopathy, well-dressed, normal affect, normal respiratory effort. On examination of the right lower extremity she has wrinkling of the skin up to the layer of the compression wrap however this has rolled down about mid shin.  There is no erythema of her shin she does have mild erythema and warmth of the forefoot and toes beneath the lateral column there is a 5 mm in diameter ulcer with 3 mm of depth there is no exposed bone or tendon this does not probe there is no active drainage  Imaging: No results found. No images are attached to the encounter.  Labs: Lab Results  Component Value Date   HGBA1C 9.4 (H) 10/01/2022   HGBA1C 11.5 (H) 02/15/2022   HGBA1C 13.0 12/30/2021   ESRSEDRATE 34 (H) 02/14/2022   ESRSEDRATE 40 (H) 08/07/2021   CRP 7.0 (H) 02/14/2022   CRP 11.1 (H) 08/07/2021   REPTSTATUS 05/02/2022 FINAL 04/29/2022   GRAMSTAIN  07/08/2021    RARE WBC  PRESENT,BOTH PMN AND MONONUCLEAR RARE GRAM POSITIVE RODS RARE GRAM POSITIVE COCCI IN PAIRS    CULT (A) 04/29/2022    CITROBACTER KOSERI SUSCEPTIBILITIES PERFORMED ON PREVIOUS CULTURE WITHIN THE LAST 5 DAYS. Performed at [facility], [address] [place], [place] [address]    Imelda Pillow CITROBACTER KOSERI 04/28/2022     Lab Results  Component Value Date   ALBUMIN 3.4 (L) 03/25/2023   ALBUMIN 3.2 (L) 03/24/2023   ALBUMIN 3.5 03/23/2023   PREALBUMIN 13.7 (L) 08/07/2021    Lab Results  Component Value Date   MG 1.9 03/24/2023   MG 2.4 10/01/2022   MG 2.1 04/30/2022   Lab Results  Component Value Date   VD25OH 36.9 12/30/2021    Lab Results  Component Value Date   PREALBUMIN 13.7 (L) 08/07/2021      Latest Ref Rng & Units 03/24/2023    4:46 AM 03/23/2023    5:13 AM 03/22/2023   12:40 PM  CBC EXTENDED  WBC 4.0 - 10.5 K/uL 7.4  7.3    RBC 3.87 - 5.11 MIL/uL 3.23  3.30    Hemoglobin 12.0 - 15.0 g/dL 9.5  9.7  9.9   HCT 82.9 - 46.0 % 30.6  31.4  31.2  Platelets 150 - 400 K/uL 108  111       There is no height or weight on file to calculate BMI.  Orders:  No orders of the defined types were placed in this encounter.  No orders of the defined types were placed in this encounter.    Procedures: No procedures performed  Clinical Data: No additional findings.  ROS:  All other systems negative, except as noted in the HPI. Review of Systems  Objective: Vital Signs: LMP 08/22/2015 (Approximate)   Specialty Comments:  No specialty comments available.  PMFS History: Patient Active Problem List   Diagnosis Date Noted   Prolapsed internal hemorrhoids, grade 3 03/29/2023   Hematochezia 03/22/2023   Thrombocytopenia (HCC) 03/21/2023   Rectal bleeding 03/15/2023   Mitral regurgitation 11/16/2022   Pressure injury of skin 10/01/2022   Proctitis 10/01/2022   Pulmonary hypertension, unspecified (HCC) 09/12/2022   GERD (gastroesophageal reflux disease)  04/30/2022   Bacteremia 04/29/2022   Non-adherence to medical treatment 03/16/2022   Cutaneous abscess of right foot    Osteomyelitis of foot (HCC) 02/14/2022   Obesity (BMI 30-39.9) 01/26/2022   Ischemic ulcer of right foot (HCC) 01/24/2022   Normocytic anemia 01/24/2022   Sacral pressure ulcer 01/24/2022   Metabolic acidosis, increased anion gap 01/24/2022   Leg wound, left, sequela 12/10/2021   Open leg wound 09/03/2021   Wound infection    Non-pressure chronic ulcer of right calf limited to breakdown of skin (HCC)    Calciphylaxis of right lower extremity with nonhealing ulcer, limited to breakdown of skin (HCC)    Chronic ulcer of left thigh (HCC) 08/07/2021   Calciphylaxis of left lower extremity with nonhealing ulcer with necrosis of muscle (HCC) 08/07/2021   Lower gastrointestinal bleeding 05/12/2021   Acute GI bleeding 04/24/2021   Acute blood loss anemia 04/23/2021   GI bleed 04/22/2021   Fever 03/31/2021   Chronic diastolic heart failure (HCC) 03/30/2021   ESRD (end stage renal disease) on dialysis (HCC) 11/16/2020   Calciphylaxis 11/06/2020   Non-healing open wound of heel 11/03/2020   Diabetic foot infection (HCC) 11/01/2020   Decubitus ulcer, heel 11/01/2020   Closed nondisplaced fracture of left patella 10/29/2020   Metabolic acidosis 06/11/2020   Acute on chronic renal failure (HCC) 06/10/2020   Anemia of chronic disease 06/10/2020   Acute pericardial effusion 06/10/2020   Chronic kidney disease, stage 4 (severe) (HCC) 03/05/2019   Vitamin D deficiency 01/28/2019   Type 2 diabetes mellitus with ESRD (end-stage renal disease) (HCC) 09/21/2015   Mixed hyperlipidemia 09/21/2015   Primary hypertension 09/21/2015   Acquired hypothyroidism 09/21/2015   Iris bomb 07/31/2012   Secondary angle-closure glaucoma 07/31/2012   Past Medical History:  Diagnosis Date   Anemia    Blindness of right eye with low vision in contralateral eye    s/p victrectomy   Chronic  diastolic heart failure (HCC) 03/30/2021   Diabetes mellitus, type II (HCC)    Dyslipidemia    Glaucoma    History of blood transfusion    Hypertension    Hypothyroidism (acquired)    Kidney disease    Stage 5   Mitral regurgitation 11/16/2022   Pneumonia    Pulmonary hypertension, unspecified (HCC) 09/12/2022    Family History  Problem Relation Age of Onset   Heart failure Mother    Heart disease Mother    Diabetes Mother    Kidney disease Mother    Heart failure Father    Diabetes Father  Heart disease Father    Diabetes Brother    Heart failure Maternal Grandmother    Heart failure Maternal Grandfather    Transient ischemic attack Maternal Grandfather    Colon cancer Neg Hx     Past Surgical History:  Procedure Laterality Date   ABDOMINAL AORTOGRAM W/LOWER EXTREMITY Bilateral 12/18/2020   Procedure: ABDOMINAL AORTOGRAM W/LOWER EXTREMITY;  Surgeon: Sherren Kerns, MD;  [facility];  Service: Cardiovascular;  Laterality: Bilateral;   ABDOMINAL AORTOGRAM W/LOWER EXTREMITY Bilateral 01/25/2022   Procedure: ABDOMINAL AORTOGRAM W/LOWER EXTREMITY;  Surgeon: Nada Libman, MD;  [facility]: [facility];  Service: Cardiovascular;  Laterality: Bilateral;   AMPUTATION Right 02/16/2022   Procedure: RIGHT FOOT 5TH RAY AMPUTATION;  Surgeon: Nadara Mustard, MD;  Location: [facility] OR;  Service: Orthopedics;  Laterality: Right;   ANKLE FRACTURE SURGERY Right    AV FISTULA PLACEMENT Left 08/18/2020   Procedure: LEFT ARM BRACHIOCEPHALIC ARTERIOVENOUS (AV) FISTULA CREATION;  Surgeon: Sherren Kerns, MD;  Location: [facility];  Service: Vascular;  Laterality: Left;   BIOPSY  04/24/2021   Procedure: BIOPSY;  Surgeon: Lanelle Bal, DO;  [facility];  Service: Endoscopy;;   CESAREAN SECTION     CHOLECYSTECTOMY     COLONOSCOPY  04/24/2021   Surgeon: Lanelle Bal, DO;  nonbleeding internal hemorrhoids, 1 large (25 mm) pedunculated transverse colon polyp  (prolapse type polyp) with adherent clot and stigmata of recent bleed.   COLONOSCOPY WITH PROPOFOL N/A 05/14/2021   Procedure: COLONOSCOPY WITH PROPOFOL;  Surgeon: Corbin Ade, MD;  [facility];  Service: Endoscopy;  Laterality: N/A;   COLONOSCOPY WITH PROPOFOL N/A 03/17/2023   Procedure: COLONOSCOPY WITH PROPOFOL;  Surgeon: Corbin Ade, MD;  [facility];  Service: Endoscopy;  Laterality: N/A;   ESOPHAGOGASTRODUODENOSCOPY (EGD) WITH PROPOFOL N/A 04/24/2021   Surgeon: Lanelle Bal, DO;  duodenal erosions and gastritis biopsied (pathology with peptic duodenitis, reactive gastropathy with erosions/chronic inflammation, negative for H. pylori)   EYE SURGERY     Vatrectomy   HEMOSTASIS CLIP PLACEMENT  05/14/2021   Procedure: HEMOSTASIS CLIP PLACEMENT;  Surgeon: Corbin Ade, MD;  [facility];  Service: Endoscopy;;   IR PERC TUN PERIT CATH WO PORT S&I /IMAG  09/15/2020   IR REMOVAL TUN CV CATH W/O FL  02/19/2021   IR US GUIDE VASC ACCESS RIGHT  09/15/2020   POLYPECTOMY  04/24/2021   Procedure: POLYPECTOMY;  Surgeon: Lanelle Bal, DO;  [facility];  Service: Endoscopy;;   POLYPECTOMY  05/14/2021   Procedure: POLYPECTOMY;  Surgeon: Corbin Ade, MD;  [facility];  Service: Endoscopy;;   POLYPECTOMY  03/17/2023   Procedure: POLYPECTOMY;  Surgeon: Corbin Ade, MD;  [facility];  Service: Endoscopy;;   SKIN SPLIT GRAFT Bilateral 09/03/2021   Procedure: SKIN GRAFT BILATERAL LEGS;  Surgeon: Nadara Mustard, MD;  Location: [name] OR;  Service: Orthopedics;  Laterality: Bilateral;   SKIN SPLIT GRAFT Left 12/10/2021   Procedure: IRRIGATION AND DEBRIDEMENT LEFT CALF, APPLICATION SPLIT THICKNESS SKIN GRAFT;  Surgeon: Nadara Mustard, MD;  Location: [facility] OR;  Service: Orthopedics;  Laterality: Left;   TOE SURGERY     Social History   Occupational History   Not on file  Tobacco Use   Smoking status: Never   Smokeless  tobacco: Never  Vaping Use   Vaping Use: Never used  Substance and Sexual Activity  Alcohol use: No   Drug use: No   Sexual activity: Yes    Birth control/protection: Condom

## 2023-05-18 ENCOUNTER — Encounter: Payer: Self-pay | Admitting: Orthopedic Surgery

## 2023-05-18 ENCOUNTER — Ambulatory Visit (INDEPENDENT_AMBULATORY_CARE_PROVIDER_SITE_OTHER): Payer: Medicare Other | Admitting: Orthopedic Surgery

## 2023-05-18 DIAGNOSIS — L97411 Non-pressure chronic ulcer of right heel and midfoot limited to breakdown of skin: Secondary | ICD-10-CM

## 2023-05-18 DIAGNOSIS — B351 Tinea unguium: Secondary | ICD-10-CM | POA: Diagnosis not present

## 2023-05-18 DIAGNOSIS — I890 Lymphedema, not elsewhere classified: Secondary | ICD-10-CM | POA: Diagnosis not present

## 2023-05-18 NOTE — Progress Notes (Signed)
Office Visit Note   Patient: Amy Moses           Date of Birth: June 25, 1971           MRN: [id number] Visit Date: 05/18/2023              Requested by: Lorelei Pont, DO [address] [place],  [place] [address] PCP: Lorelei Pont, DO  Chief Complaint  Patient presents with   Right Leg - Follow-up   Left Leg - Follow-up   Right Foot - Wound Check    Plantar ulcer      HPI: Patient is a 52 year old woman who is seen in follow-up for both lower extremities.  She has venous and lymphatic insufficiency with history of calciphylaxis.  Assessment & Plan: Visit Diagnoses:  1. Lymphedema   2. Ulcer of midfoot, right, limited to breakdown of skin (HCC)   3. Onychomycosis     Plan: Nails were trimmed x 9 compression wraps applied bilaterally.  Patient's right ulcer is healing currently 5 mm in diameter.  Patient may be weightbearing as tolerated bilateral lower extremities.  Follow-Up Instructions: No follow-ups on file.   Ortho Exam  Patient is alert, oriented, no adenopathy, well-dressed, normal affect, normal respiratory effort. Examination there is decreased swelling of both legs from the serial compression wraps there is no ulcers or cellulitis.  Patient has a small 5 mm diameter 2 mm in depth ulcer over the base of the fifth metatarsal.  Ulcer does not probe to bone.  Both legs are wrapped she will continue with serial wraps with home health nursing.  Imaging: No results found. No images are attached to the encounter.  Labs: Lab Results  Component Value Date   HGBA1C 9.4 (H) 10/01/2022   HGBA1C 11.5 (H) 02/15/2022   HGBA1C 13.0 12/30/2021   ESRSEDRATE 34 (H) 02/14/2022   ESRSEDRATE 40 (H) 08/07/2021   CRP 7.0 (H) 02/14/2022   CRP 11.1 (H) 08/07/2021   REPTSTATUS 05/02/2022 FINAL 04/29/2022   GRAMSTAIN  07/08/2021    RARE WBC PRESENT,BOTH PMN AND MONONUCLEAR RARE GRAM POSITIVE RODS RARE GRAM POSITIVE COCCI IN PAIRS    CULT (A) 04/29/2022    CITROBACTER  KOSERI SUSCEPTIBILITIES PERFORMED ON PREVIOUS CULTURE WITHIN THE LAST 5 DAYS. Performed at [facility], [address], [place], [place] [address]    Imelda Pillow CITROBACTER KOSERI 04/28/2022     Lab Results  Component Value Date   ALBUMIN 3.4 (L) 03/25/2023   ALBUMIN 3.2 (L) 03/24/2023   ALBUMIN 3.5 03/23/2023   PREALBUMIN 13.7 (L) 08/07/2021    Lab Results  Component Value Date   MG 1.9 03/24/2023   MG 2.4 10/01/2022   MG 2.1 04/30/2022   Lab Results  Component Value Date   VD25OH 36.9 12/30/2021    Lab Results  Component Value Date   PREALBUMIN 13.7 (L) 08/07/2021      Latest Ref Rng & Units 03/24/2023    4:46 AM 03/23/2023    5:13 AM 03/22/2023   12:40 PM  CBC EXTENDED  WBC 4.0 - 10.5 K/uL 7.4  7.3    RBC 3.87 - 5.11 MIL/uL 3.23  3.30    Hemoglobin 12.0 - 15.0 g/dL 9.5  9.7  9.9   HCT 36.6 - 46.0 % 30.6  31.4  31.2   Platelets 150 - 400 K/uL 108  111       There is no height or weight on file to calculate BMI.  Orders:  No orders  of the defined types were placed in this encounter.  No orders of the defined types were placed in this encounter.    Procedures: No procedures performed  Clinical Data: No additional findings.  ROS:  All other systems negative, except as noted in the HPI. Review of Systems  Objective: Vital Signs: LMP 08/22/2015 (Approximate)   Specialty Comments:  No specialty comments available.  PMFS History: Patient Active Problem List   Diagnosis Date Noted   Prolapsed internal hemorrhoids, grade 3 03/29/2023   Hematochezia 03/22/2023   Thrombocytopenia (HCC) 03/21/2023   Rectal bleeding 03/15/2023   Mitral regurgitation 11/16/2022   Pressure injury of skin 10/01/2022   Proctitis 10/01/2022   Pulmonary hypertension, unspecified (HCC) 09/12/2022   GERD (gastroesophageal reflux disease) 04/30/2022   Bacteremia 04/29/2022   Non-adherence to medical treatment 03/16/2022   Cutaneous abscess of right foot    Osteomyelitis  of foot (HCC) 02/14/2022   Obesity (BMI 30-39.9) 01/26/2022   Ischemic ulcer of right foot (HCC) 01/24/2022   Normocytic anemia 01/24/2022   Sacral pressure ulcer 01/24/2022   Metabolic acidosis, increased anion gap 01/24/2022   Leg wound, left, sequela 12/10/2021   Open leg wound 09/03/2021   Wound infection    Non-pressure chronic ulcer of right calf limited to breakdown of skin (HCC)    Calciphylaxis of right lower extremity with nonhealing ulcer, limited to breakdown of skin (HCC)    Chronic ulcer of left thigh (HCC) 08/07/2021   Calciphylaxis of left lower extremity with nonhealing ulcer with necrosis of muscle (HCC) 08/07/2021   Lower gastrointestinal bleeding 05/12/2021   Acute GI bleeding 04/24/2021   Acute blood loss anemia 04/23/2021   GI bleed 04/22/2021   Fever 03/31/2021   Chronic diastolic heart failure (HCC) 03/30/2021   ESRD (end stage renal disease) on dialysis (HCC) 11/16/2020   Calciphylaxis 11/06/2020   Non-healing open wound of heel 11/03/2020   Diabetic foot infection (HCC) 11/01/2020   Decubitus ulcer, heel 11/01/2020   Closed nondisplaced fracture of left patella 10/29/2020   Metabolic acidosis 06/11/2020   Acute on chronic renal failure (HCC) 06/10/2020   Anemia of chronic disease 06/10/2020   Acute pericardial effusion 06/10/2020   Chronic kidney disease, stage 4 (severe) (HCC) 03/05/2019   Vitamin D deficiency 01/28/2019   Type 2 diabetes mellitus with ESRD (end-stage renal disease) (HCC) 09/21/2015   Mixed hyperlipidemia 09/21/2015   Primary hypertension 09/21/2015   Acquired hypothyroidism 09/21/2015   Iris bomb 07/31/2012   Secondary angle-closure glaucoma 07/31/2012   Past Medical History:  Diagnosis Date   Anemia    Blindness of right eye with low vision in contralateral eye    s/p victrectomy   Chronic diastolic heart failure (HCC) 03/30/2021   Diabetes mellitus, type II (HCC)    Dyslipidemia    Glaucoma    History of blood transfusion     Hypertension    Hypothyroidism (acquired)    Kidney disease    Stage 5   Mitral regurgitation 11/16/2022   Pneumonia    Pulmonary hypertension, unspecified (HCC) 09/12/2022    Family History  Problem Relation Age of Onset   Heart failure Mother    Heart disease Mother    Diabetes Mother    Kidney disease Mother    Heart failure Father    Diabetes Father    Heart disease Father    Diabetes Brother    Heart failure Maternal Grandmother    Heart failure Maternal Grandfather    Transient ischemic attack Maternal  Grandfather    Colon cancer Neg Hx     Past Surgical History:  Procedure Laterality Date   ABDOMINAL AORTOGRAM W/LOWER EXTREMITY Bilateral 12/18/2020   Procedure: ABDOMINAL AORTOGRAM W/LOWER EXTREMITY;  Surgeon: Sherren Kerns, MD;  Location: [facility];  Service: Cardiovascular;  Laterality: Bilateral;   ABDOMINAL AORTOGRAM W/LOWER EXTREMITY Bilateral 01/25/2022   Procedure: ABDOMINAL AORTOGRAM W/LOWER EXTREMITY;  Surgeon: Nada Libman, MD;  Location: [facility];  Service: Cardiovascular;  Laterality: Bilateral;   AMPUTATION Right 02/16/2022   Procedure: RIGHT FOOT 5TH RAY AMPUTATION;  Surgeon: Nadara Mustard, MD;  Location: [facility];  Service: Orthopedics;  Laterality: Right;   ANKLE FRACTURE SURGERY Right    AV FISTULA PLACEMENT Left 08/18/2020   Procedure: LEFT ARM BRACHIOCEPHALIC ARTERIOVENOUS (AV) FISTULA CREATION;  Surgeon: Sherren Kerns, MD;  Location: [facility] OR;  Service: Vascular;  Laterality: Left;   BIOPSY  04/24/2021   Procedure: BIOPSY;  Surgeon: Lanelle Bal, DO;  Location: [facility];  Service: Endoscopy;;   CESAREAN SECTION     CHOLECYSTECTOMY     COLONOSCOPY  04/24/2021   Surgeon: Lanelle Bal, DO;  nonbleeding internal hemorrhoids, 1 large (25 mm) pedunculated transverse colon polyp (prolapse type polyp) with adherent clot and stigmata of recent bleed.   COLONOSCOPY WITH PROPOFOL N/A 05/14/2021   Procedure: COLONOSCOPY  WITH PROPOFOL;  Surgeon: Corbin Ade, MD;  [facility]: [facility];  Service: Endoscopy;  Laterality: N/A;   COLONOSCOPY WITH PROPOFOL N/A 03/17/2023   Procedure: COLONOSCOPY WITH PROPOFOL;  Surgeon: Corbin Ade, MD;  [facility];  Service: Endoscopy;  Laterality: N/A;   ESOPHAGOGASTRODUODENOSCOPY (EGD) WITH PROPOFOL N/A 04/24/2021   Surgeon: Lanelle Bal, DO;  duodenal erosions and gastritis biopsied (pathology with peptic duodenitis, reactive gastropathy with erosions/chronic inflammation, negative for H. pylori)   EYE SURGERY     Vatrectomy   HEMOSTASIS CLIP PLACEMENT  05/14/2021   Procedure: HEMOSTASIS CLIP PLACEMENT;  Surgeon: Corbin Ade, MD;  [facility];  Service: Endoscopy;;   IR PERC TUN PERIT CATH WO PORT S&I /IMAG  09/15/2020   IR REMOVAL TUN CV CATH W/O FL  02/19/2021   IR US GUIDE VASC ACCESS RIGHT  09/15/2020   POLYPECTOMY  04/24/2021   Procedure: POLYPECTOMY;  Surgeon: Lanelle Bal, DO;  [facility];  Service: Endoscopy;;   POLYPECTOMY  05/14/2021   Procedure: POLYPECTOMY;  Surgeon: Corbin Ade, MD;  [facility];  Service: Endoscopy;;   POLYPECTOMY  03/17/2023   Procedure: POLYPECTOMY;  Surgeon: Corbin Ade, MD;  [facility];  Service: Endoscopy;;   SKIN SPLIT GRAFT Bilateral 09/03/2021   Procedure: SKIN GRAFT BILATERAL LEGS;  Surgeon: Nadara Mustard, MD;  Location: [facility] OR;  Service: Orthopedics;  Laterality: Bilateral;   SKIN SPLIT GRAFT Left 12/10/2021   Procedure: IRRIGATION AND DEBRIDEMENT LEFT CALF, APPLICATION SPLIT THICKNESS SKIN GRAFT;  Surgeon: Nadara Mustard, MD;  Location: [facility] OR;  Service: Orthopedics;  Laterality: Left;   TOE SURGERY     Social History   Occupational History   Not on file  Tobacco Use   Smoking status: Never   Smokeless tobacco: Never  Vaping Use   Vaping Use: Never used  Substance and Sexual Activity   Alcohol use: No   Drug use: No   Sexual activity:  Yes    Birth control/protection: Condom

## 2023-05-19 ENCOUNTER — Ambulatory Visit (HOSPITAL_BASED_OUTPATIENT_CLINIC_OR_DEPARTMENT_OTHER): Payer: Medicare Other | Admitting: Cardiovascular Disease

## 2023-05-21 NOTE — Progress Notes (Deleted)
   [facility] Banding Procedure Note:   Amy Moses is a 52 y.o. female presenting today for consideration of hemorrhoid banding. Last colonoscopy 03/17/23***.  Latex Allergy: yes/no***  Interval History: ***  The patient presents with symptomatic grade 3 hemorrhoids, unresponsive to maximal medical therapy, requesting rubber band ligation of his/her hemorrhoidal disease. All risks, benefits, and alternative forms of therapy were described and informed consent was obtained.   The decision was made to band the right anterior internal hemorrhoid, and the [facility] System was used to perform band ligation without complication. Digital anorectal examination was then performed to assure proper positioning of the band, and to adjust the banded tissue as required. The patient was discharged home without pain or other issues. Dietary and behavioral recommendations were given and (if necessary prescriptions were given), along with follow-up instructions. The patient will return in 2-3 weeks for followup and possible additional banding as required.  No complications were encountered and the patient tolerated the procedure well.    Brooke Bonito, MSN, FNP-BC, AGACNP-BC [facility]

## 2023-05-22 ENCOUNTER — Encounter: Payer: Medicare Other | Admitting: Gastroenterology

## 2023-06-09 ENCOUNTER — Telehealth: Payer: Self-pay | Admitting: Orthopedic Surgery

## 2023-06-09 NOTE — Telephone Encounter (Signed)
Amber from [facility] called she would like orders for a front wheel walker. Her cb#[phone]

## 2023-06-09 NOTE — Telephone Encounter (Signed)
[facility], she needs [name] to send order for walker to [name] in [place]

## 2023-06-09 NOTE — Telephone Encounter (Signed)
Faxed order with demo sheet to [facility]

## 2023-06-12 ENCOUNTER — Ambulatory Visit: Payer: Medicare Other | Admitting: Adult Health

## 2023-06-16 ENCOUNTER — Telehealth (HOSPITAL_BASED_OUTPATIENT_CLINIC_OR_DEPARTMENT_OTHER): Payer: Self-pay | Admitting: Cardiovascular Disease

## 2023-06-16 ENCOUNTER — Ambulatory Visit (HOSPITAL_BASED_OUTPATIENT_CLINIC_OR_DEPARTMENT_OTHER): Payer: Medicare Other | Admitting: Family

## 2023-06-16 NOTE — Telephone Encounter (Signed)
Patient called to request her past due 6 month f/u be a telephone visit.

## 2023-06-16 NOTE — Telephone Encounter (Signed)
Spoke with Call center, they will return call to patient and get her scheduled.

## 2023-06-16 NOTE — Progress Notes (Deleted)
Cardiology Office Note:  .   Date:  06/16/2023  ID:  Amy Moses, DOB 05-Nov-1971, MRN [id number] PCP: Lorelei Pont, DO  [facility] Providers Cardiologist:  None { Click to update primary MD,subspecialty MD or APP then REFRESH:1}   History of Present Illness: .   Amy Moses is a 52 y.o. female with history of diabetes, hypertension, hyperlipidemia, pulmonary hypertension, ESRD on HD, right eye blindness, hypothyroidism.  Admitted 04/2022 with bacteremia treated with 4 weeks of antibiotics.  Echo at that time LVEF 45 to 50%, global hypokinesis, grade 2 diastolic dysfunction, PASP 54 mmHg.  Repeat echo in the outpatient setting 07/2022 LVEF 50 to 55%, indeterminate diastolic dysfunction, PASP 64 mmHg, RA pressure 8.  Lexiscan Myoview 07/2022 LVEF 60%, fixed defect in mid to basal inferior and inferolateral region felt to be due to diaphragmatic activity, overall low risk study.  At visit 08/2022 she had episodes of symptomatic hypotension particularly during dialysis.  Midodrine was ordered to utilize prior to dialysis.  Autoimmune workup was unremarkable.  PFTs were abnormal and referred to pulmonology.  Hospitalized 09/2022 at [facility] with GI bleed.  Last seen 11/16/2022 doing well with addition of midodrine which she was taken daily at the direction of nephrology.  Admitted 02/23/23 - 03/17/2023 with rectal bleeding concerning for lower GI bleed with colonoscopy revealing prominent hemorrhoids as well as hemorrhagic ascending colonic polyp.  Readmitted 4/29 - 03/26/2023 with lower GI bleed due to internal hemorrhoids with plan for possible outpatient hemorrhoid banding.     ROS: Please see the history of present illness.    All other systems reviewed and are negative.   Studies Reviewed: .        Cardiac Studies & Procedures     STRESS TESTS  MYOCARDIAL PERFUSION IMAGING 07/27/2022  Narrative   Findings are consistent with no prior ischemia. The study is low risk.   No  ST deviation was noted.   LV perfusion is abnormal. Defect 1: There is a large defect with moderate reduction in uptake present in the mid to basal inferior and inferolateral location(s) that is fixed. There is normal wall motion in the defect area. Consistent with artifact caused by subdiaphragmatic activity.   Left ventricular function is normal. Nuclear stress EF: 60 %. The left ventricular ejection fraction is normal (55-65%). End diastolic cavity size is mildly enlarged. End systolic cavity size is normal.   Prior study not available for comparison.  Large size, moderate intensity fixed basal to mid inferior, inferolateral and apical perfusion defect with subdiaphragmatic attenuation artifact. No reversible ischemia. LVEF 60% with normal wall motion. Mild RV uptake may suggest pulmonary hypertension. This is a low risk study. No prior study for comparison.   ECHOCARDIOGRAM  ECHOCARDIOGRAM COMPLETE 07/27/2022  Narrative ECHOCARDIOGRAM REPORT    Patient Name:   Amy Moses Date of Exam: 07/27/2022 Medical Rec #:  [id number]       Height:       66.0 in Accession #:    [id number]      Weight:       205.0 lb Date of Birth:  1971/09/12      BSA:          2.021 m Patient Age:    50 years        BP:           122/68 mmHg Patient Gender: F               HR:  76 bpm. Exam Location:  [address]  Procedure: 2D Echo, Cardiac Doppler, Color Doppler and 3D Echo  Indications:    I50.42 CHF  History:        Patient has prior history of Echocardiogram examinations, most recent 04/01/2021. CHF, Arrythmias:ESRD; Risk Factors:Hypertension, Dyslipidemia and Diabetes.  Sonographer:    Samule Ohm RDCS Referring Phys: [id number] TIFFANY [place]  IMPRESSIONS   1. Left ventricular ejection fraction, by estimation, is 50 to 55%. The left ventricle has low normal function. The left ventricle has no regional wall motion abnormalities. The left ventricular internal cavity size was mildly  dilated. Left ventricular diastolic parameters are indeterminate. Elevated left ventricular end-diastolic pressure. 2. Right ventricular systolic function is mildly reduced. The right ventricular size is moderately enlarged. There is severely elevated pulmonary artery systolic pressure. The estimated right ventricular systolic pressure is 64.2 mmHg. 3. Left atrial size was moderately dilated. 4. Right atrial size was mildly dilated. 5. The mitral valve is degenerative. Moderate to severe mitral valve regurgitation. No evidence of mitral stenosis. 6. Tricuspid valve regurgitation is severe. 7. The aortic valve is calcified. There is moderate calcification of the aortic valve. There is severe thickening of the aortic valve. Aortic valve regurgitation is not visualized. Aortic valve sclerosis/calcification is present, without any evidence of aortic stenosis. 8. The inferior vena cava is dilated in size with >50% respiratory variability, suggesting right atrial pressure of 8 mmHg. 9. Small to moderate circumferential pericardial effusion with no Rv diastolic collapse or RA inversion. Respirophasic changes in MV inflow velocities was < 25%.  FINDINGS Left Ventricle: Left ventricular ejection fraction, by estimation, is 50 to 55%. The left ventricle has low normal function. The left ventricle has no regional wall motion abnormalities. The left ventricular internal cavity size was mildly dilated. There is no left ventricular hypertrophy. Left ventricular diastolic parameters are indeterminate. Elevated left ventricular end-diastolic pressure.  Right Ventricle: The right ventricular size is moderately enlarged. No increase in right ventricular wall thickness. Right ventricular systolic function is mildly reduced. There is severely elevated pulmonary artery systolic pressure. The tricuspid regurgitant velocity is 3.75 m/s, and with an assumed right atrial pressure of 8 mmHg, the estimated right ventricular  systolic pressure is 64.2 mmHg.  Left Atrium: Left atrial size was moderately dilated.  Right Atrium: Right atrial size was mildly dilated.  Pericardium: Small to moderate circumferential pericardial effusion with no Rv diastolic collapse or RA inversion. Respirophasic changes in MV inflow velocities was < 25%. A small pericardial effusion is present. The pericardial effusion is circumferential. There is no evidence of cardiac tamponade.  Mitral Valve: The mitral valve is degenerative in appearance. There is moderate thickening of the mitral valve leaflet(s). There is moderate calcification of the mitral valve leaflet(s). Mild to moderate mitral annular calcification. Moderate to severe mitral valve regurgitation, with eccentric posteriorly directed jet. No evidence of mitral valve stenosis.  Tricuspid Valve: The tricuspid valve is normal in structure. Tricuspid valve regurgitation is severe. No evidence of tricuspid stenosis.  Aortic Valve: The aortic valve is calcified. There is moderate calcification of the aortic valve. There is severe thickening of the aortic valve. Aortic valve regurgitation is not visualized. Aortic valve sclerosis/calcification is present, without any evidence of aortic stenosis.  Pulmonic Valve: The pulmonic valve was normal in structure. Pulmonic valve regurgitation is mild. No evidence of pulmonic stenosis.  Aorta: The aortic root is normal in size and structure.  Venous: The inferior vena cava is dilated in size with greater than  50% respiratory variability, suggesting right atrial pressure of 8 mmHg.  IAS/Shunts: No atrial level shunt detected by color flow Doppler.   LEFT VENTRICLE PLAX 2D LVIDd:         5.40 cm   Diastology LVIDs:         4.25 cm   LV e' medial:    8.49 cm/s LV PW:         1.00 cm   LV E/e' medial:  16.7 LV IVS:        1.10 cm   LV e' lateral:   12.30 cm/s LVOT diam:     1.70 cm   LV E/e' lateral: 11.5 LV SV:         50 LV SV Index:    25 LVOT Area:     2.27 cm  3D Volume EF: 3D EF:        52 % LV EDV:       171 ml LV ESV:       82 ml LV SV:        89 ml  RIGHT VENTRICLE             IVC RV S prime:     10.20 cm/s  IVC diam: 2.10 cm TAPSE (M-mode): 1.6 cm RVSP:           64.2 mmHg  LEFT ATRIUM           Index        RIGHT ATRIUM           Index LA diam:      4.20 cm 2.08 cm/m   RA Pressure: 8.00 mmHg LA Vol (A4C): 91.1 ml 45.08 ml/m  RA Area:     19.90 cm RA Volume:   61.50 ml  30.43 ml/m AORTIC VALVE LVOT Vmax:   97.40 cm/s LVOT Vmean:  65.200 cm/s LVOT VTI:    0.219 m  AORTA Ao Root diam: 2.90 cm Ao Asc diam:  3.00 cm  MITRAL VALVE                TRICUSPID VALVE MV Area (PHT): 3.28 cm     TR Peak grad:   56.2 mmHg MV Decel Time: 231 msec     TR Vmax:        375.00 cm/s MV E velocity: 142.00 cm/s  Estimated RAP:  8.00 mmHg MV A velocity: 102.00 cm/s  RVSP:           64.2 mmHg MV E/A ratio:  1.39 SHUNTS Systemic VTI:  0.22 m Systemic Diam: 1.70 cm  Armanda Magic MD Electronically signed by Armanda Magic MD Signature Date/Time: 07/27/2022/10:31:09 AM    Final             Risk Assessment/Calculations:   {Does this patient have ATRIAL FIBRILLATION?:[id number]} No BP recorded.  {Refresh Note OR Click here to enter BP  :1}***       Physical Exam:   VS:  LMP 08/22/2015 (Approximate)    Wt Readings from Last 3 Encounters:  04/13/23 210 lb (95.3 kg)  03/29/23 210 lb (95.3 kg)  03/25/23 212 lb 8.4 oz (96.4 kg)    GEN: Well nourished, well developed in no acute distress NECK: No JVD; No carotid bruits CARDIAC: ***RRR, no murmurs, rubs, gallops RESPIRATORY:  Clear to auscultation without rales, wheezing or rhonchi  ABDOMEN: Soft, non-tender, non-distended EXTREMITIES:  No edema; No deformity   ASSESSMENT AND PLAN: .    Chronic diastolic heart failure-  Volume management per HD. *** ESRD on HD -   Pulmonary hypertension- Suspected due to MR and diastolic dysfunction. Follows with Dr.  Kendrick Fries. ***  Mitral regurgitation-per Dr. Duke Salvia previous notes not good surgical candidate and anatomy challengin for Mitraclip. ***  Hyperlipidemia-    {Are you ordering a CV Procedure (e.g. stress test, cath, DCCV, TEE, etc)?   Press F2        :[id number]}  Dispo: ***  Signed, Alver Sorrow, NP

## 2023-06-19 ENCOUNTER — Ambulatory Visit: Payer: Medicare Other | Admitting: Orthopedic Surgery

## 2023-06-24 ENCOUNTER — Encounter (HOSPITAL_COMMUNITY): Payer: Self-pay

## 2023-06-24 ENCOUNTER — Other Ambulatory Visit: Payer: Self-pay

## 2023-06-24 ENCOUNTER — Emergency Department (HOSPITAL_COMMUNITY)
Admission: EM | Admit: 2023-06-24 | Discharge: 2023-06-24 | Disposition: A | Discharge status: Home / Self Care | Payer: Medicare Other | Source: Home / Self Care | Attending: Emergency Medicine | Admitting: Emergency Medicine

## 2023-06-24 ENCOUNTER — Emergency Department (HOSPITAL_COMMUNITY): Payer: Medicare Other

## 2023-06-24 DIAGNOSIS — I700 Atherosclerosis of aorta: Secondary | ICD-10-CM | POA: Diagnosis not present

## 2023-06-24 DIAGNOSIS — Z794 Long term (current) use of insulin: Secondary | ICD-10-CM | POA: Insufficient documentation

## 2023-06-24 DIAGNOSIS — Z992 Dependence on renal dialysis: Secondary | ICD-10-CM | POA: Diagnosis not present

## 2023-06-24 DIAGNOSIS — N186 End stage renal disease: Secondary | ICD-10-CM | POA: Insufficient documentation

## 2023-06-24 DIAGNOSIS — Z20822 Contact with and (suspected) exposure to covid-19: Secondary | ICD-10-CM | POA: Insufficient documentation

## 2023-06-24 DIAGNOSIS — R531 Weakness: Secondary | ICD-10-CM

## 2023-06-24 DIAGNOSIS — D649 Anemia, unspecified: Secondary | ICD-10-CM | POA: Diagnosis not present

## 2023-06-24 LAB — CBC WITH DIFFERENTIAL/PLATELET
Abs Immature Granulocytes: 0.02 10*3/uL (ref 0.00–0.07)
Basophils Absolute: 0 10*3/uL (ref 0.0–0.1)
Basophils Relative: 1 %
Eosinophils Absolute: 0.1 10*3/uL (ref 0.0–0.5)
Eosinophils Relative: 2 %
HCT: 33.5 % — ABNORMAL LOW (ref 36.0–46.0)
Hemoglobin: 10.6 g/dL — ABNORMAL LOW (ref 12.0–15.0)
Immature Granulocytes: 0 %
Lymphocytes Relative: 16 %
Lymphs Abs: 0.9 10*3/uL (ref 0.7–4.0)
MCH: 29.2 pg (ref 26.0–34.0)
MCHC: 31.6 g/dL (ref 30.0–36.0)
MCV: 92.3 fL (ref 80.0–100.0)
Monocytes Absolute: 0.5 10*3/uL (ref 0.1–1.0)
Monocytes Relative: 9 %
Neutro Abs: 4 10*3/uL (ref 1.7–7.7)
Neutrophils Relative %: 72 %
Platelets: 110 10*3/uL — ABNORMAL LOW (ref 150–400)
RBC: 3.63 MIL/uL — ABNORMAL LOW (ref 3.87–5.11)
RDW: 14.6 % (ref 11.5–15.5)
WBC: 5.6 10*3/uL (ref 4.0–10.5)
nRBC: 0 % (ref 0.0–0.2)

## 2023-06-24 LAB — BASIC METABOLIC PANEL
Anion gap: 9 (ref 5–15)
BUN: 40 mg/dL — ABNORMAL HIGH (ref 6–20)
CO2: 27 mmol/L (ref 22–32)
Calcium: 8 mg/dL — ABNORMAL LOW (ref 8.9–10.3)
Chloride: 98 mmol/L (ref 98–111)
Creatinine, Ser: 5.91 mg/dL — ABNORMAL HIGH (ref 0.44–1.00)
GFR, Estimated: 8 mL/min — ABNORMAL LOW (ref >60)
Glucose, Bld: 216 mg/dL — ABNORMAL HIGH (ref 70–99)
Potassium: 3.9 mmol/L (ref 3.5–5.1)
Sodium: 134 mmol/L — ABNORMAL LOW (ref 135–145)

## 2023-06-24 LAB — RESP PANEL BY RT-PCR (RSV, FLU A&B, COVID)  RVPGX2
Influenza A by PCR: NEGATIVE
Influenza B by PCR: NEGATIVE
Resp Syncytial Virus by PCR: NEGATIVE
SARS Coronavirus 2 by RT PCR: NEGATIVE

## 2023-06-24 LAB — TYPE AND SCREEN
ABO/RH(D): O POS
Antibody Screen: NEGATIVE

## 2023-06-24 NOTE — ED Provider Notes (Signed)
[place] EMERGENCY DEPARTMENT AT [facility] Provider Note   CSN: [id number] Arrival date & time: 06/24/23  2054     History {Add pertinent medical, surgical, social history, OB history to HPI:1} Chief Complaint  Patient presents with   Weakness    Amy Moses is a 52 y.o. female.  Patient is a dialysis patient.  She has been having some bleeding from hemorrhoids and she feels fatigued today.  She wants to know if her hemoglobin is low   Weakness      Home Medications Prior to Admission medications   Medication Sig Start Date End Date Taking? Authorizing Provider  acetaminophen (TYLENOL) 500 MG tablet Take 1,000 mg by mouth every 6 (six) hours as needed for moderate pain.    [provider]  AURYXIA 1 GM 210 MG(Fe) tablet Take 630 mg by mouth 3 (three) times daily with meals. 01/07/22   [provider]  Blood Glucose Monitoring Suppl (ACCU-CHEK GUIDE ME) w/Device KIT 1 Piece by Does not apply route as directed. 03/16/22   Roma Kayser, MD  Blood Pressure Monitor KIT TAKE BLOOD PRESSURE DAILY 09/15/22   Chilton Si, MD  Continuous Blood Gluc Receiver (FREESTYLE LIBRE 2 READER) DEVI As directed 04/06/22   Roma Kayser, MD  Continuous Blood Gluc Sensor (FREESTYLE LIBRE 2 SENSOR) MISC 1 Piece by Does not apply route every 14 (fourteen) days. 04/06/22   Roma Kayser, MD  doxycycline (VIBRA-TABS) 100 MG tablet Take 1 tablet (100 mg total) by mouth 2 (two) times daily. 05/10/23   Adonis Huguenin, NP  glucose blood (ACCU-CHEK GUIDE) test strip Use as instructed 03/16/22   Roma Kayser, MD  HUMALOG KWIKPEN 100 UNIT/ML KwikPen Inject 5-11 Units into the skin 3 (three) times daily with meals. If eats 50% or more of meal. Patient taking differently: Inject 5-11 Units into the skin 3 (three) times daily with meals. If eats 50% or more of meal. Sliding scale 03/16/22   Nida, Denman George, MD  insulin glargine (LANTUS) 100  UNIT/ML injection Inject 0.2 mLs (20 Units total) into the skin at bedtime. Patient taking differently: Inject 8 Units into the skin at bedtime. 04/06/22   Roma Kayser, MD  levothyroxine (SYNTHROID) 75 MCG tablet Take 75 mcg by mouth daily. 06/13/22   [provider]  midodrine (PROAMATINE) 10 MG tablet Take 10 mg by mouth daily. 09/27/22   [provider]  pantoprazole (PROTONIX) 40 MG tablet Take 1 tablet (40 mg total) by mouth 2 (two) times daily. 04/25/21   Shon Hale, MD  polyethylene glycol powder (MIRALAX) 17 GM/SCOOP powder Take 17 g by mouth daily. 03/17/23   Narda Bonds, MD  Vitamin D, Ergocalciferol, (DRISDOL) 1.25 MG (50000 UNIT) CAPS capsule Take 50,000 Units by mouth every 7 (seven) days. Friday 04/20/21   [provider]  Wheat Dextrin (BENEFIBER) POWD Stir 2 teaspoons of Benefiber Original into 4-8 oz. of beverage or soft food (hot or cold), three times daily. Stir well until dissolved (up to 60 seconds.) 03/17/23   Narda Bonds, MD      Allergies    Ace inhibitors    Review of Systems   Review of Systems  Neurological:  Positive for weakness.    Physical Exam Updated Vital Signs BP 104/62   Pulse 72   Temp 97.8 F (36.6 C) (Oral)   Resp 17   Ht 5\' 6"  (1.676 m)   Wt 95 kg   LMP  08/22/2015 (Approximate)   SpO2 95%   BMI 33.80 kg/m  Physical Exam  ED Results / Procedures / Treatments   Labs (all labs ordered are listed, but only abnormal results are displayed) Labs Reviewed  CBC WITH DIFFERENTIAL/PLATELET - Abnormal; Notable for the following components:      Result Value   RBC 3.63 (*)    Hemoglobin 10.6 (*)    HCT 33.5 (*)    Platelets 110 (*)    All other components within normal limits  BASIC METABOLIC PANEL - Abnormal; Notable for the following components:   Sodium 134 (*)    Glucose, Bld 216 (*)    BUN 40 (*)    Creatinine, Ser 5.91 (*)    Calcium 8.0 (*)    GFR, Estimated 8 (*)    All other components  within normal limits  RESP PANEL BY RT-PCR (RSV, FLU A&B, COVID)  RVPGX2  TYPE AND SCREEN    EKG EKG Interpretation Date/Time:  Saturday June 24 2023 21:49:56 EDT Ventricular Rate:  69 PR Interval:  142 QRS Duration:  89 QT Interval:  436 QTC Calculation: 468 R Axis:   95  Text Interpretation: Sinus rhythm Borderline right axis deviation Abnormal T, consider ischemia, lateral leads Confirmed by Bethann Berkshire [phone]) on 06/24/2023 11:40:08 PM  Radiology DG Chest Port 1 View  Result Date: 06/24/2023 CLINICAL DATA:  weak EXAM: PORTABLE CHEST 1 VIEW COMPARISON:  Chest x-ray 08/10/2022, CT abdomen pelvis GI bleed 09/30/2022 FINDINGS: Enlarged cardiac silhouette the heart and mediastinal contours are unchanged. Aortic calcification No focal consolidation. No pulmonary edema. No pleural effusion. No pneumothorax. No acute osseous abnormality. IMPRESSION: Persistent cardiomegaly with underlying pericardial effusion not excluded. Aortic Atherosclerosis (ICD10-I70.0). Electronically Signed   By: Tish Frederickson M.D.   On: 06/24/2023 22:20    Procedures Procedures  {Document cardiac monitor, telemetry assessment procedure when appropriate:1}  Medications Ordered in ED Medications - No data to display  ED Course/ Medical Decision Making/ A&P   {   Click here for ABCD2, HEART and other calculatorsREFRESH Note before signing :1}                              Medical Decision Making Amount and/or Complexity of Data Reviewed Labs: ordered. Radiology: ordered. ECG/medicine tests: ordered.   Patient with general fatigue.  No explanation seen on x-rays or labs.  She will follow back up with her doctor next week  {Document critical care time when appropriate:1} {Document review of labs and clinical decision tools ie heart score, Chads2Vasc2 etc:1}  {Document your independent review of radiology images, and any outside records:1} {Document your discussion with family members, caretakers, and  with consultants:1} {Document social determinants of health affecting pt's care:1} {Document your decision making why or why not admission, treatments were needed:1} Final Clinical Impression(s) / ED Diagnoses Final diagnoses:  Weakness    Rx / DC Orders ED Discharge Orders     None

## 2023-06-24 NOTE — ED Triage Notes (Signed)
Pt arrived via POV c/o generalized weakness, and chills. Pt is dialysis Tue, Thurs, Sat and reports making it to all her appointments this weak. Pt concerned her Hgb is low causing her to feel the way she does. Pt if LUE Restricted.

## 2023-06-24 NOTE — Discharge Instructions (Signed)
Follow-up with your doctor next week for recheck.  Rest over the weekend

## 2023-06-30 ENCOUNTER — Encounter (HOSPITAL_BASED_OUTPATIENT_CLINIC_OR_DEPARTMENT_OTHER): Payer: Self-pay | Admitting: Family

## 2023-06-30 ENCOUNTER — Telehealth (INDEPENDENT_AMBULATORY_CARE_PROVIDER_SITE_OTHER): Payer: Medicare Other | Admitting: Family

## 2023-06-30 VITALS — Ht 66.0 in

## 2023-06-30 DIAGNOSIS — N186 End stage renal disease: Secondary | ICD-10-CM | POA: Diagnosis not present

## 2023-06-30 DIAGNOSIS — I340 Nonrheumatic mitral (valve) insufficiency: Secondary | ICD-10-CM | POA: Diagnosis not present

## 2023-06-30 DIAGNOSIS — E785 Hyperlipidemia, unspecified: Secondary | ICD-10-CM

## 2023-06-30 DIAGNOSIS — I5032 Chronic diastolic (congestive) heart failure: Secondary | ICD-10-CM

## 2023-06-30 DIAGNOSIS — G4733 Obstructive sleep apnea (adult) (pediatric): Secondary | ICD-10-CM | POA: Diagnosis not present

## 2023-06-30 DIAGNOSIS — Z992 Dependence on renal dialysis: Secondary | ICD-10-CM

## 2023-06-30 DIAGNOSIS — I272 Pulmonary hypertension, unspecified: Secondary | ICD-10-CM

## 2023-06-30 NOTE — Progress Notes (Unsigned)
Virtual Visit via Telephone Note   Because of Amy Moses's co-morbid illnesses, she is at least at moderate risk for complications without adequate follow up.  This format is felt to be most appropriate for this patient at this time.  The patient did not have access to video technology/had technical difficulties with video requiring transitioning to audio format only (telephone).  All issues noted in this document were discussed and addressed.  No physical exam could be performed with this format.  Please refer to the patient's chart for her consent to telehealth for [facility].    Date:  06/30/2023   ID:  Amy Moses, DOB Jul 25, 1971, MRN [id number] The patient was identified using 2 identifiers.  Patient Location: Home Provider Location: Office/Clinic   PCP:  Lorelei Pont, DO    [facility] Providers Cardiologist:  Chilton Si, MD { Click to update primary MD,subspecialty MD or APP then REFRESH:1}    Evaluation Performed:  Follow-Up Visit  Chief Complaint:  diastolic heart failure follow up   History of Present Illness:    Amy Moses is a 52 y.o. female with with history of diabetes, hypertension, hyperlipidemia, pulmonary hypertension, ESRD on HD, right eye blindness, hypothyroidism.  Admitted 04/2022 with bacteremia treated with 4 weeks of antibiotics.  Echo at that time LVEF 45 to 50%, global hypokinesis, grade 2 diastolic dysfunction, PASP 54 mmHg.  Repeat echo in the outpatient setting 07/2022 LVEF 50 to 55%, indeterminate diastolic dysfunction, PASP 64 mmHg, RA pressure 8.  Lexiscan Myoview 07/2022 LVEF 60%, fixed defect in mid to basal inferior and inferolateral region felt to be due to diaphragmatic activity, overall low risk study.  At visit 08/2022 she had episodes of symptomatic hypotension particularly during dialysis.  Midodrine was ordered to utilize prior to dialysis.  Autoimmune workup was unremarkable.  PFTs were abnormal and  referred to pulmonology.  Hospitalized 09/2022 at [name] with GI bleed.  Last seen 11/16/2022 doing well with addition of midodrine which she was taken daily at the direction of nephrology.  Admitted 02/23/23 - 03/17/2023 with rectal bleeding concerning for lower GI bleed with colonoscopy revealing prominent hemorrhoids as well as hemorrhagic ascending colonic polyp.  Readmitted 4/29 - 03/26/2023 with lower GI bleed due to internal hemorrhoids with plan for possible outpatient hemorrhoid banding.   Presents today for follow up.***  Now with CPAP Bleeding time to time - did get banding and has one more upcoming this month   Past Medical History:  Diagnosis Date   Anemia    Blindness of right eye with low vision in contralateral eye    s/p victrectomy   Chronic diastolic heart failure (HCC) 03/30/2021   Diabetes mellitus, type II (HCC)    Dyslipidemia    Glaucoma    History of blood transfusion    Hypertension    Hypothyroidism (acquired)    Kidney disease    Stage 5   Mitral regurgitation 11/16/2022   Pneumonia    Pulmonary hypertension, unspecified (HCC) 09/12/2022   Past Surgical History:  Procedure Laterality Date   ABDOMINAL AORTOGRAM W/LOWER EXTREMITY Bilateral 12/18/2020   Procedure: ABDOMINAL AORTOGRAM W/LOWER EXTREMITY;  Surgeon: Sherren Kerns, MD;  Location: [facility] INVASIVE CV LAB;  Service: Cardiovascular;  Laterality: Bilateral;   ABDOMINAL AORTOGRAM W/LOWER EXTREMITY Bilateral 01/25/2022   Procedure: ABDOMINAL AORTOGRAM W/LOWER EXTREMITY;  Surgeon: Nada Libman, MD;  Location: [facility] INVASIVE CV LAB;  Service: Cardiovascular;  Laterality: Bilateral;   AMPUTATION Right 02/16/2022   Procedure: RIGHT FOOT  5TH RAY AMPUTATION;  Surgeon: Nadara Mustard, MD;  Location: [facility] OR;  Service: Orthopedics;  Laterality: Right;   ANKLE FRACTURE SURGERY Right    AV FISTULA PLACEMENT Left 08/18/2020   Procedure: LEFT ARM BRACHIOCEPHALIC ARTERIOVENOUS (AV) FISTULA CREATION;  Surgeon:  Sherren Kerns, MD;  Location: [name] OR;  Service: Vascular;  Laterality: Left;   BIOPSY  04/24/2021   Procedure: BIOPSY;  Surgeon: Lanelle Bal, DO;  Location: [facility] ENDO SU[facility];  Service: Endoscopy;;   CESAREAN SECTION     CHOLECYSTECTOMY     COLONOSCOPY  04/24/2021   Surgeon: Lanelle Bal, DO;  nonbleeding internal hemorrhoids, 1 large (25 mm) pedunculated transverse colon polyp (prolapse type polyp) with adherent clot and stigmata of recent bleed.   COLONOSCOPY WITH PROPOFOL N/A 05/14/2021   Procedure: COLONOSCOPY WITH PROPOFOL;  Surgeon: Corbin Ade, MD;  Location: [facility];  Service: Endoscopy;  Laterality: N/A;   COLONOSCOPY WITH PROPOFOL N/A 03/17/2023   Procedure: COLONOSCOPY WITH PROPOFOL;  Surgeon: Corbin Ade, MD;  Location: [facility];  Service: Endoscopy;  Laterality: N/A;   ESOPHAGOGASTRODUODENOSCOPY (EGD) WITH PROPOFOL N/A 04/24/2021   Surgeon: Lanelle Bal, DO;  duodenal erosions and gastritis biopsied (pathology with peptic duodenitis, reactive gastropathy with erosions/chronic inflammation, negative for H. pylori)   EYE SURGERY     Vatrectomy   HEMOSTASIS CLIP PLACEMENT  05/14/2021   Procedure: HEMOSTASIS CLIP PLACEMENT;  Surgeon: Corbin Ade, MD;  Location: [facility];  Service: Endoscopy;;   IR PERC TUN PERIT CATH WO PORT S&I /IMAG  09/15/2020   IR REMOVAL TUN CV CATH W/O FL  02/19/2021   IR US GUIDE VASC ACCESS RIGHT  09/15/2020   POLYPECTOMY  04/24/2021   Procedure: POLYPECTOMY;  Surgeon: Lanelle Bal, DO;  Location: [facility];  Service: Endoscopy;;   POLYPECTOMY  05/14/2021   Procedure: POLYPECTOMY;  Surgeon: Corbin Ade, MD;  [facility]: [facility];  Service: Endoscopy;;   POLYPECTOMY  03/17/2023   Procedure: POLYPECTOMY;  Surgeon: Corbin Ade, MD;  [facility]: [facility];  Service: Endoscopy;;   SKIN SPLIT GRAFT Bilateral 09/03/2021   Procedure: SKIN GRAFT BILATERAL LEGS;  Surgeon: Nadara Mustard, MD;  Location[facility] OR;  Service: Orthopedics;  Laterality: Bilateral;   SKIN SPLIT GRAFT Left 12/10/2021   Procedure: IRRIGATION AND DEBRIDEMENT LEFT CALF, APPLICATION SPLIT THICKNESS SKIN GRAFT;  Surgeon: Nadara Mustard, MD;  Location: [facility] OR;  Service: Orthopedics;  Laterality: Left;   TOE SURGERY       Current Meds  Medication Sig   acetaminophen (TYLENOL) 500 MG tablet Take 1,000 mg by mouth every 6 (six) hours as needed for moderate pain.   atorvastatin (LIPITOR) 10 MG tablet Take 10 mg by mouth daily.   AURYXIA 1 GM 210 MG(Fe) tablet Take 630 mg by mouth 3 (three) times daily with meals.   Blood Glucose Monitoring Suppl (ACCU-CHEK GUIDE ME) w/Device KIT 1 Piece by Does not apply route as directed.   Blood Pressure Monitor KIT TAKE BLOOD PRESSURE DAILY   Continuous Blood Gluc Receiver (FREESTYLE LIBRE 2 READER) DEVI As directed   Continuous Blood Gluc Sensor (FREESTYLE LIBRE 2 SENSOR) MISC 1 Piece by Does not apply route every 14 (fourteen) days.   doxycycline (VIBRA-TABS) 100 MG tablet Take 1 tablet (100 mg total) by mouth 2 (two) times daily.   glucose blood (ACCU-CHEK GUIDE) test strip Use as instructed   HUMALOG KWIKPEN 100 UNIT/ML KwikPen Inject  5-11 Units into the skin 3 (three) times daily with meals. If eats 50% or more of meal. (Patient taking differently: Inject 5-11 Units into the skin 3 (three) times daily with meals. If eats 50% or more of meal. Sliding scale)   insulin glargine (LANTUS) 100 UNIT/ML injection Inject 0.2 mLs (20 Units total) into the skin at bedtime. (Patient taking differently: Inject 8 Units into the skin at bedtime.)   levothyroxine (SYNTHROID) 75 MCG tablet Take 75 mcg by mouth daily.   midodrine (PROAMATINE) 10 MG tablet Take 10 mg by mouth daily.   pantoprazole (PROTONIX) 40 MG tablet Take 1 tablet (40 mg total) by mouth 2 (two) times daily.   polyethylene glycol powder (MIRALAX) 17 GM/SCOOP powder Take 17 g by mouth daily.   Vitamin D, Ergocalciferol, (DRISDOL) 1.25 MG  (50000 UNIT) CAPS capsule Take 50,000 Units by mouth every 7 (seven) days. Friday   Wheat Dextrin (BENEFIBER) POWD Stir 2 teaspoons of Benefiber Original into 4-8 oz. of beverage or soft food (hot or cold), three times daily. Stir well until dissolved (up to 60 seconds.)     Allergies:   Ace inhibitors   Social History   Tobacco Use   Smoking status: Never   Smokeless tobacco: Never  Vaping Use   Vaping status: Never Used  Substance Use Topics   Alcohol use: No   Drug use: No     Family Hx: The patient's family history includes Diabetes in her brother, father, and mother; Heart disease in her father and mother; Heart failure in her father, maternal grandfather, maternal grandmother, and mother; Kidney disease in her mother; Transient ischemic attack in her maternal grandfather. There is no history of Colon cancer.  ROS:   Please see the history of present illness.    *** All other systems reviewed and are negative.   Prior CV studies:   The following studies were reviewed today:  ***  Labs/Other Tests and Data Reviewed:    EKG:  {EKG/Telemetry Strips [phone]}  Recent Labs: 03/20/2023: ALT 12 03/24/2023: Magnesium 1.9 06/24/2023: BUN 40; Creatinine, Ser 5.91; Hemoglobin 10.6; Platelets 110; Potassium 3.9; Sodium 134   Recent Lipid Panel Lab Results  Component Value Date/Time   CHOL 85 01/26/2022 05:55 AM   TRIG 98 01/26/2022 05:55 AM   HDL 25 (L) 01/26/2022 05:55 AM   CHOLHDL 3.4 01/26/2022 05:55 AM   LDLCALC 40 01/26/2022 05:55 AM    Wt Readings from Last 3 Encounters:  06/24/23 209 lb 7 oz (95 kg)  04/13/23 210 lb (95.3 kg)  03/29/23 210 lb (95.3 kg)     Risk Assessment/Calculations:          Objective:    Vital Signs:  Ht 5\' 6"  (1.676 m)   LMP 08/22/2015 (Approximate)   BMI 33.80 kg/m    VITAL SIGNS:  reviewed  ASSESSMENT & PLAN:     Chronic diastolic heart failure- Volume management per HD. *** ESRD on HD -   Pulmonary  hypertension- Suspected due to MR and diastolic dysfunction. Follows with Dr. Kendrick Fries. ***  Mitral regurgitation-per Dr. Duke Salvia previous notes not good surgical candidate and anatomy challengin for Mitraclip. ***  Hyperlipidemia-            Time:   Today, I have spent 7 minutes with the patient with telehealth technology discussing the above problems.     Medication Adjustments/Labs and Tests Ordered: Current medicines are reviewed at length with the patient today.  Concerns regarding medicines are outlined  above.   Tests Ordered: No orders of the defined types were placed in this encounter.   Medication Changes: No orders of the defined types were placed in this encounter.   Follow Up:  In Person in 6 month(s)  Signed, Alver Sorrow, NP  06/30/2023 3:54 PM    [facility]

## 2023-06-30 NOTE — Patient Instructions (Signed)
Medication Instructions:  Your physician recommends that you continue on your current medications as directed. Please refer to the Current Medication list given to you today.  *If you need a refill on your cardiac medications before your next appointment, please call your pharmacy*  Follow-Up: At [facility], you and your health needs are our priority.  As part of our continuing mission to provide you with exceptional heart care, we have created designated Provider Care Teams.  These Care Teams include your primary Cardiologist (physician) and Advanced Practice Providers (APPs -  Physician Assistants and Nurse Practitioners) who all work together to provide you with the care you need, when you need it.  We recommend signing up for the patient portal called "MyChart".  Sign up information is provided on this After Visit Summary.  MyChart is used to connect with patients for Virtual Visits (Telemedicine).  Patients are able to view lab/test results, encounter notes, upcoming appointments, etc.  Non-urgent messages can be sent to your provider as well.   To learn more about what you can do with MyChart, go to [email or web address].    Your next appointment:   6 months with Dr. Duke Salvia or Gillian Shields, NP

## 2023-07-03 ENCOUNTER — Ambulatory Visit (INDEPENDENT_AMBULATORY_CARE_PROVIDER_SITE_OTHER): Payer: Medicare Other | Admitting: Orthopedic Surgery

## 2023-07-03 ENCOUNTER — Encounter: Payer: Self-pay | Admitting: Orthopedic Surgery

## 2023-07-03 DIAGNOSIS — I890 Lymphedema, not elsewhere classified: Secondary | ICD-10-CM

## 2023-07-03 NOTE — Progress Notes (Signed)
Office Visit Note   Patient: Amy Moses           Date of Birth: [birth year]           MRN: [id number] Visit Date: 07/03/2023              Requested by: Lorelei Pont, DO [address] [place],  [place] [address] PCP: Lorelei Pont, DO  Chief Complaint  Patient presents with   Right Leg - Follow-up   Left Leg - Follow-up   Right Foot - Wound Check    Plantar ulcer      HPI: Patient is a 52 year old woman with history of bilateral lower extremity wounds with calciphylaxis and bilateral foot wounds.  She is currently undergoing home health nursing dressing changes at home weekly.  Assessment & Plan: Visit Diagnoses:  1. Lymphedema     Plan: Patient's legs continue to improve we will reevaluate for the lymphedema pumps.  She will continue with home health nursing compression wraps changed weekly.  Follow-Up Instructions: Return in about 4 weeks (around 07/31/2023).   Ortho Exam  Patient is alert, oriented, no adenopathy, well-dressed, normal affect, normal respiratory effort. Examination patient has hypertrophic callus both heels and dorsum of the left foot.  The hypertrophic callus was debrided both lower extremities there is no open ulcers no cellulitis no drainage.  There is decreased swelling in both legs but increased swelling from the knee proximally.  No weeping edema no cellulitis.  Imaging: No results found. No images are attached to the encounter.  Labs: Lab Results  Component Value Date   HGBA1C 9.4 (H) 10/01/2022   HGBA1C 11.5 (H) 02/15/2022   HGBA1C 13.0 12/30/2021   ESRSEDRATE 34 (H) 02/14/2022   ESRSEDRATE 40 (H) 08/07/2021   CRP 7.0 (H) 02/14/2022   CRP 11.1 (H) 08/07/2021   REPTSTATUS 05/02/2022 FINAL 04/29/2022   GRAMSTAIN  07/08/2021    RARE WBC PRESENT,BOTH PMN AND MONONUCLEAR RARE GRAM POSITIVE RODS RARE GRAM POSITIVE COCCI IN PAIRS    CULT (A) 04/29/2022    CITROBACTER KOSERI SUSCEPTIBILITIES PERFORMED ON PREVIOUS CULTURE WITHIN THE  LAST 5 DAYS. Performed at [facility], [address] [place], [place] [address]    Imelda Pillow CITROBACTER KOSERI 04/28/2022     Lab Results  Component Value Date   ALBUMIN 3.4 (L) 03/25/2023   ALBUMIN 3.2 (L) 03/24/2023   ALBUMIN 3.5 03/23/2023   PREALBUMIN 13.7 (L) 08/07/2021    Lab Results  Component Value Date   MG 1.9 03/24/2023   MG 2.4 10/01/2022   MG 2.1 04/30/2022   Lab Results  Component Value Date   VD25OH 36.9 12/30/2021    Lab Results  Component Value Date   PREALBUMIN 13.7 (L) 08/07/2021      Latest Ref Rng & Units 06/24/2023   10:33 PM 03/24/2023    4:46 AM 03/23/2023    5:13 AM  CBC EXTENDED  WBC 4.0 - 10.5 K/uL 5.6  7.4  7.3   RBC 3.87 - 5.11 MIL/uL 3.63  3.23  3.30   Hemoglobin 12.0 - 15.0 g/dL 82.9  9.5  9.7   HCT 56.2 - 46.0 % 33.5  30.6  31.4   Platelets 150 - 400 K/uL 110  108  111   NEUT# 1.7 - 7.7 K/uL 4.0     Lymph# 0.7 - 4.0 K/uL 0.9        There is no height or weight on file to calculate BMI.  Orders:  No orders  of the defined types were placed in this encounter.  No orders of the defined types were placed in this encounter.    Procedures: No procedures performed  Clinical Data: No additional findings.  ROS:  All other systems negative, except as noted in the HPI. Review of Systems  Objective: Vital Signs: LMP 08/22/2015 (Approximate)   Specialty Comments:  No specialty comments available.  PMFS History: Patient Active Problem List   Diagnosis Date Noted   Prolapsed internal hemorrhoids, grade 3 03/29/2023   Hematochezia 03/22/2023   Thrombocytopenia (HCC) 03/21/2023   Rectal bleeding 03/15/2023   Mitral regurgitation 11/16/2022   Pressure injury of skin 10/01/2022   Proctitis 10/01/2022   Pulmonary hypertension, unspecified (HCC) 09/12/2022   GERD (gastroesophageal reflux disease) 04/30/2022   Bacteremia 04/29/2022   Non-adherence to medical treatment 03/16/2022   Cutaneous abscess of right foot     Osteomyelitis of foot (HCC) 02/14/2022   Obesity (BMI 30-39.9) 01/26/2022   Ischemic ulcer of right foot (HCC) 01/24/2022   Normocytic anemia 01/24/2022   Sacral pressure ulcer 01/24/2022   Metabolic acidosis, increased anion gap 01/24/2022   Leg wound, left, sequela 12/10/2021   Open leg wound 09/03/2021   Wound infection    Non-pressure chronic ulcer of right calf limited to breakdown of skin (HCC)    Calciphylaxis of right lower extremity with nonhealing ulcer, limited to breakdown of skin (HCC)    Chronic ulcer of left thigh (HCC) 08/07/2021   Calciphylaxis of left lower extremity with nonhealing ulcer with necrosis of muscle (HCC) 08/07/2021   Lower gastrointestinal bleeding 05/12/2021   Acute GI bleeding 04/24/2021   Acute blood loss anemia 04/23/2021   GI bleed 04/22/2021   Fever 03/31/2021   Chronic diastolic heart failure (HCC) 03/30/2021   ESRD (end stage renal disease) on dialysis (HCC) 11/16/2020   Calciphylaxis 11/06/2020   Non-healing open wound of heel 11/03/2020   Diabetic foot infection (HCC) 11/01/2020   Decubitus ulcer, heel 11/01/2020   Closed nondisplaced fracture of left patella 10/29/2020   Metabolic acidosis 06/11/2020   Acute on chronic renal failure (HCC) 06/10/2020   Anemia of chronic disease 06/10/2020   Acute pericardial effusion 06/10/2020   Chronic kidney disease, stage 4 (severe) (HCC) 03/05/2019   Vitamin D deficiency 01/28/2019   Type 2 diabetes mellitus with ESRD (end-stage renal disease) (HCC) 09/21/2015   Mixed hyperlipidemia 09/21/2015   Primary hypertension 09/21/2015   Acquired hypothyroidism 09/21/2015   Iris bomb 07/31/2012   Secondary angle-closure glaucoma 07/31/2012   Past Medical History:  Diagnosis Date   Anemia    Blindness of right eye with low vision in contralateral eye    s/p victrectomy   Chronic diastolic heart failure (HCC) 03/30/2021   Diabetes mellitus, type II (HCC)    Dyslipidemia    Glaucoma    History of  blood transfusion    Hypertension    Hypothyroidism (acquired)    Kidney disease    Stage 5   Mitral regurgitation 11/16/2022   Pneumonia    Pulmonary hypertension, unspecified (HCC) 09/12/2022    Family History  Problem Relation Age of Onset   Heart failure Mother    Heart disease Mother    Diabetes Mother    Kidney disease Mother    Heart failure Father    Diabetes Father    Heart disease Father    Diabetes Brother    Heart failure Maternal Grandmother    Heart failure Maternal Grandfather    Transient ischemic attack Maternal  Grandfather    Colon cancer Neg Hx     Past Surgical History:  Procedure Laterality Date   ABDOMINAL AORTOGRAM W/LOWER EXTREMITY Bilateral 12/18/2020   Procedure: ABDOMINAL AORTOGRAM W/LOWER EXTREMITY;  Surgeon: Sherren Kerns, MD;  Location: [facility];  Service: Cardiovascular;  Laterality: Bilateral;   ABDOMINAL AORTOGRAM W/LOWER EXTREMITY Bilateral 01/25/2022   Procedure: ABDOMINAL AORTOGRAM W/LOWER EXTREMITY;  Surgeon: Nada Libman, MD;  Location: [facility];  Service: Cardiovascular;  Laterality: Bilateral;   AMPUTATION Right 02/16/2022   Procedure: RIGHT FOOT 5TH RAY AMPUTATION;  Surgeon: Nadara Mustard, MD;  Location: [name] OR;  Service: Orthopedics;  Laterality: Right;   ANKLE FRACTURE SURGERY Right    AV FISTULA PLACEMENT Left 08/18/2020   Procedure: LEFT ARM BRACHIOCEPHALIC ARTERIOVENOUS (AV) FISTULA CREATION;  Surgeon: Sherren Kerns, MD;  Location: [name] OR;  Service: Vascular;  Laterality: Left;   BIOPSY  04/24/2021   Procedure: BIOPSY;  Surgeon: Lanelle Bal, DO;  Location: [facility];  Service: Endoscopy;;   CESAREAN SECTION     CHOLECYSTECTOMY     COLONOSCOPY  04/24/2021   Surgeon: Lanelle Bal, DO;  nonbleeding internal hemorrhoids, 1 large (25 mm) pedunculated transverse colon polyp (prolapse type polyp) with adherent clot and stigmata of recent bleed.   COLONOSCOPY WITH PROPOFOL N/A 05/14/2021    Procedure: COLONOSCOPY WITH PROPOFOL;  Surgeon: Corbin Ade, MD;  Location: [facility];  Service: Endoscopy;  Laterality: N/A;   COLONOSCOPY WITH PROPOFOL N/A 03/17/2023   Procedure: COLONOSCOPY WITH PROPOFOL;  Surgeon: Corbin Ade, MD;  Location: [facility];  Service: Endoscopy;  Laterality: N/A;   ESOPHAGOGASTRODUODENOSCOPY (EGD) WITH PROPOFOL N/A 04/24/2021   Surgeon: Lanelle Bal, DO;  duodenal erosions and gastritis biopsied (pathology with peptic duodenitis, reactive gastropathy with erosions/chronic inflammation, negative for H. pylori)   EYE SURGERY     Vatrectomy   HEMOSTASIS CLIP PLACEMENT  05/14/2021   Procedure: HEMOSTASIS CLIP PLACEMENT;  Surgeon: Corbin Ade, MD;  Location: [facility];  Service: Endoscopy;;   IR PERC TUN PERIT CATH WO PORT S&I /IMAG  09/15/2020   IR REMOVAL TUN CV CATH W/O FL  02/19/2021   IR US GUIDE VASC ACCESS RIGHT  09/15/2020   POLYPECTOMY  04/24/2021   Procedure: POLYPECTOMY;  Surgeon: Lanelle Bal, DO;  Location: [facility];  Service: Endoscopy;;   POLYPECTOMY  05/14/2021   Procedure: POLYPECTOMY;  Surgeon: Corbin Ade, MD;  Location: [facility];  Service: Endoscopy;;   POLYPECTOMY  03/17/2023   Procedure: POLYPECTOMY;  Surgeon: Corbin Ade, MD;  Location: [facility];  Service: Endoscopy;;   SKIN SPLIT GRAFT Bilateral 09/03/2021   Procedure: SKIN GRAFT BILATERAL LEGS;  Surgeon: Nadara Mustard, MD;  Location: [facility] OR;  Service: Orthopedics;  Laterality: Bilateral;   SKIN SPLIT GRAFT Left 12/10/2021   Procedure: IRRIGATION AND DEBRIDEMENT LEFT CALF, APPLICATION SPLIT THICKNESS SKIN GRAFT;  Surgeon: Nadara Mustard, MD;  Location: [facility] OR;  Service: Orthopedics;  Laterality: Left;   TOE SURGERY     Social History   Occupational History   Not on file  Tobacco Use   Smoking status: Never   Smokeless tobacco: Never  Vaping Use   Vaping status: Never Used  Substance and Sexual Activity   Alcohol use: No   Drug  use: No   Sexual activity: Yes    Birth control/protection: Condom

## 2023-07-07 NOTE — Progress Notes (Signed)
, °

## 2023-07-13 ENCOUNTER — Telehealth (INDEPENDENT_AMBULATORY_CARE_PROVIDER_SITE_OTHER): Payer: Medicare Other | Admitting: Adult Health

## 2023-07-13 DIAGNOSIS — G4733 Obstructive sleep apnea (adult) (pediatric): Secondary | ICD-10-CM | POA: Diagnosis not present

## 2023-07-13 DIAGNOSIS — I272 Pulmonary hypertension, unspecified: Secondary | ICD-10-CM

## 2023-07-13 NOTE — Progress Notes (Signed)
Reviewed and agree with assessment/plan.   Coralyn Helling, MD [name] Pulmonary/Critical Care 07/13/2023, 4:51 PM Pager:  [phone]

## 2023-07-13 NOTE — Progress Notes (Signed)
Virtual Visit via Video Note  I connected with Amy Moses on 07/13/23 at 11:30 AM EDT by a video enabled telemedicine application and verified that I am speaking with the correct person using two identifiers.  Location: Patient: Home  Provider: Office    I discussed the limitations of evaluation and management by telemedicine and the availability of in person appointments. The patient expressed understanding and agreed to proceed.  History of Present Illness: 52 year old female seen for pulmonary consult December 16, 2022 for shortness of breath and pulmonary hypertension.  Found to have underlying moderate obstructive sleep apnea.  Medical history significant for end-stage renal disease on dialysis, diabetes, calciphylaxis and diastolic heart failure  Today's video visit is a 90-month follow-up for sleep apnea.  Patient was seen in January for a consult for shortness of breath and pulmonary hypertension.  Previous echo had showed elevated pulmonary artery pressures, RS VSP 64 mmHg.  Patient was set up for a home sleep study that was done February 26, 2023.  This showed moderate sleep apnea with AHI at 16.2/hour and SpO2 low at 75%.  Last visit she was recommended to begin on CPAP therapy.  Patient says she is doing well on CPAP.  It took her a couple weeks to get used to CPAP but she is using it every single night.  Does feel that she is not quite as tired.  And feels that she sleeps better.  She is using nasal pillows.  CPAP download shows excellent compliance with daily average usage at 6 hours.  AHI at 2.3/hour.  Patient is on auto CPAP 5 to 15 cm H2.  She is using nasal pillows.  Says this is more comfortable for her. Patient has a history of diastolic heart failure, moderate to severe mitral valve regurg and severe tricuspid regurg.  She does have an upcoming appointment for further pulmonary hypertension evaluation in October with Dr. Judeth Horn.  PFTs are pending.     Observations/Objective: 07/13/2023 -appears as well in no acute distress . home sleep study that was completed on February 26, 2023 that showed moderate sleep apnea with AHI at 16.2/hour and SpO2 low at 75%.   October 2023 PFT: Ratio normal, FVC 1.97L 51% predicted, DLCO 11.37, 50% predicted    September 2023 chest x-ray significant cardiomegaly, normal pulmonary parenchyma   November 2023 pulmonary perfusion scan normal   Echo: September 2023 transthoracic echocardiogram LVEF 55%, left atrium dilated, RV function mildly reduced, RV size moderately enlarged, RVSP 64 mmHg, tricuspid regurgitant jet velocity 3.75, severe TR  November 2023 negative HIV  VQ scan August 12 2022 normal  Assessment and Plan: Moderate obstructive sleep apnea with excellent control compliance on nocturnal CPAP. Continue on current settings  Pulmonary hypertension-with underlying diastolic heart failure, valvular disease with severe mitral valve regurg and tricuspid valve regurg, sleep apnea.  HIV testing negative.  VQ scan normal September 2023.  PFTs are pending-will keep follow-up planned with Dr. Judeth Horn in October with PFTs.  Plan  Patient Instructions  Continue on CPAP At bedtime, goal is to wear all night long for at least 6hr or more  Do not drive if sleepy  Work on healthy weight .  Follow up in 6 months Amy Kiesler NP and As needed  for sleep apnea.   Follow up as planned with Dr. Judeth Horn with PFT (Pulmonary Hypertension) in October.     Follow Up Instructions:    I discussed the assessment and treatment plan with the patient. The patient was  provided an opportunity to ask questions and all were answered. The patient agreed with the plan and demonstrated an understanding of the instructions.   The patient was advised to call back or seek an in-person evaluation if the symptoms worsen or if the condition fails to improve as anticipated.  I provided 22  minutes of non-face-to-face time  during this encounter.   Rubye Oaks, NP;

## 2023-07-13 NOTE — Patient Instructions (Addendum)
Continue on CPAP At bedtime, goal is to wear all night long for at least 6hr or more  Do not drive if sleepy  Work on healthy weight .  Follow up in 6 months Pearce Littlefield NP and As needed  for sleep apnea.   Follow up as planned with Dr. Judeth Horn with PFT (Pulmonary Hypertension) in October.

## 2023-07-20 ENCOUNTER — Ambulatory Visit (INDEPENDENT_AMBULATORY_CARE_PROVIDER_SITE_OTHER): Payer: Medicare Other | Admitting: Gastroenterology

## 2023-07-20 ENCOUNTER — Encounter: Payer: Self-pay | Admitting: Gastroenterology

## 2023-07-20 VITALS — BP 118/74 | HR 76 | Temp 97.8°F | Ht 66.0 in | Wt 210.0 lb

## 2023-07-20 DIAGNOSIS — K642 Third degree hemorrhoids: Secondary | ICD-10-CM | POA: Diagnosis not present

## 2023-07-20 NOTE — Progress Notes (Signed)
       [facility] BANDING PROCEDURE NOTE  Amy Moses is a 52 y.o. female presenting today for consideration of hemorrhoid banding. She has had Left lateral and right posterior.   The patient presents with symptomatic grade 3 hemorrhoids, unresponsive to maximal medical therapy, requesting rubber band ligation of her hemorrhoidal disease. All risks, benefits, and alternative forms of therapy were described and informed consent was obtained.   The decision was made to band the right anterior internal hemorrhoid, and the [facility] System was used to perform band ligation without complication. Digital anorectal examination was then performed to assure proper positioning of the band, and to adjust the banded tissue as required. The patient was discharged home without pain or other issues. Dietary and behavioral recommendations were given, along with follow-up instructions. The patient will return prn.   No complications were encountered and the patient tolerated the procedure well.   Gelene Mink, PhD, ANP-BC [facility] Gastroenterology

## 2023-07-20 NOTE — Patient Instructions (Signed)
  Please avoid straining.  You should limit your toilet time to 2-3 minutes at the most.   I recommend Benefiber 2 teaspoons each morning in the beverage of your choice!  Please call me with any concerns or issues!  I will see you as needed!  Please call if you feel you need another banding!  I enjoyed seeing you again today! I value our relationship and want to provide genuine, compassionate, and quality care. You may receive a survey regarding your visit with me, and I welcome your feedback! Thanks so much for taking the time to complete this. I look forward to seeing you again.      Gelene Mink, PhD, ANP-BC [name] Gastroenterology

## 2023-07-31 ENCOUNTER — Ambulatory Visit: Payer: Medicare Other | Admitting: Orthopedic Surgery

## 2023-08-02 ENCOUNTER — Inpatient Hospital Stay (HOSPITAL_COMMUNITY)
Admission: EM | Admit: 2023-08-02 | Discharge: 2023-08-08 | DRG: 299 | Disposition: A | Discharge status: Home / Self Care | Payer: Medicare Other | Attending: Family Medicine | Admitting: Family Medicine

## 2023-08-02 ENCOUNTER — Other Ambulatory Visit: Payer: Self-pay

## 2023-08-02 ENCOUNTER — Emergency Department (HOSPITAL_COMMUNITY): Payer: Medicare Other

## 2023-08-02 ENCOUNTER — Encounter (HOSPITAL_COMMUNITY): Payer: Self-pay

## 2023-08-02 DIAGNOSIS — I10 Essential (primary) hypertension: Secondary | ICD-10-CM

## 2023-08-02 DIAGNOSIS — Z6834 Body mass index (BMI) 34.0-34.9, adult: Secondary | ICD-10-CM

## 2023-08-02 DIAGNOSIS — L89159 Pressure ulcer of sacral region, unspecified stage: Secondary | ICD-10-CM

## 2023-08-02 DIAGNOSIS — Z79890 Hormone replacement therapy (postmenopausal): Secondary | ICD-10-CM

## 2023-08-02 DIAGNOSIS — E782 Mixed hyperlipidemia: Secondary | ICD-10-CM | POA: Diagnosis present

## 2023-08-02 DIAGNOSIS — L03115 Cellulitis of right lower limb: Secondary | ICD-10-CM | POA: Diagnosis present

## 2023-08-02 DIAGNOSIS — Z86010 Personal history of colonic polyps: Secondary | ICD-10-CM

## 2023-08-02 DIAGNOSIS — Z993 Dependence on wheelchair: Secondary | ICD-10-CM

## 2023-08-02 DIAGNOSIS — I272 Pulmonary hypertension, unspecified: Secondary | ICD-10-CM | POA: Diagnosis present

## 2023-08-02 DIAGNOSIS — E119 Type 2 diabetes mellitus without complications: Secondary | ICD-10-CM | POA: Diagnosis present

## 2023-08-02 DIAGNOSIS — E1151 Type 2 diabetes mellitus with diabetic peripheral angiopathy without gangrene: Principal | ICD-10-CM | POA: Diagnosis present

## 2023-08-02 DIAGNOSIS — E11621 Type 2 diabetes mellitus with foot ulcer: Secondary | ICD-10-CM | POA: Diagnosis present

## 2023-08-02 DIAGNOSIS — E039 Hypothyroidism, unspecified: Secondary | ICD-10-CM | POA: Diagnosis present

## 2023-08-02 DIAGNOSIS — T148XXA Other injury of unspecified body region, initial encounter: Secondary | ICD-10-CM | POA: Diagnosis not present

## 2023-08-02 DIAGNOSIS — H5461 Unqualified visual loss, right eye, normal vision left eye: Secondary | ICD-10-CM | POA: Diagnosis present

## 2023-08-02 DIAGNOSIS — G4733 Obstructive sleep apnea (adult) (pediatric): Secondary | ICD-10-CM | POA: Diagnosis present

## 2023-08-02 DIAGNOSIS — M86171 Other acute osteomyelitis, right ankle and foot: Secondary | ICD-10-CM | POA: Diagnosis present

## 2023-08-02 DIAGNOSIS — Z79899 Other long term (current) drug therapy: Secondary | ICD-10-CM

## 2023-08-02 DIAGNOSIS — M898X9 Other specified disorders of bone, unspecified site: Secondary | ICD-10-CM | POA: Diagnosis present

## 2023-08-02 DIAGNOSIS — Z8711 Personal history of peptic ulcer disease: Secondary | ICD-10-CM

## 2023-08-02 DIAGNOSIS — E1122 Type 2 diabetes mellitus with diabetic chronic kidney disease: Secondary | ICD-10-CM | POA: Diagnosis not present

## 2023-08-02 DIAGNOSIS — Z794 Long term (current) use of insulin: Secondary | ICD-10-CM

## 2023-08-02 DIAGNOSIS — L97511 Non-pressure chronic ulcer of other part of right foot limited to breakdown of skin: Secondary | ICD-10-CM | POA: Diagnosis present

## 2023-08-02 DIAGNOSIS — L89156 Pressure-induced deep tissue damage of sacral region: Secondary | ICD-10-CM | POA: Diagnosis present

## 2023-08-02 DIAGNOSIS — Z841 Family history of disorders of kidney and ureter: Secondary | ICD-10-CM

## 2023-08-02 DIAGNOSIS — E1169 Type 2 diabetes mellitus with other specified complication: Secondary | ICD-10-CM | POA: Diagnosis present

## 2023-08-02 DIAGNOSIS — Z992 Dependence on renal dialysis: Secondary | ICD-10-CM

## 2023-08-02 DIAGNOSIS — Z5941 Food insecurity: Secondary | ICD-10-CM

## 2023-08-02 DIAGNOSIS — Z833 Family history of diabetes mellitus: Secondary | ICD-10-CM

## 2023-08-02 DIAGNOSIS — I132 Hypertensive heart and chronic kidney disease with heart failure and with stage 5 chronic kidney disease, or end stage renal disease: Secondary | ICD-10-CM | POA: Diagnosis present

## 2023-08-02 DIAGNOSIS — I70234 Atherosclerosis of native arteries of right leg with ulceration of heel and midfoot: Secondary | ICD-10-CM | POA: Diagnosis present

## 2023-08-02 DIAGNOSIS — N186 End stage renal disease: Secondary | ICD-10-CM | POA: Diagnosis not present

## 2023-08-02 DIAGNOSIS — Z888 Allergy status to other drugs, medicaments and biological substances status: Secondary | ICD-10-CM

## 2023-08-02 DIAGNOSIS — I5032 Chronic diastolic (congestive) heart failure: Secondary | ICD-10-CM | POA: Diagnosis present

## 2023-08-02 DIAGNOSIS — N2581 Secondary hyperparathyroidism of renal origin: Secondary | ICD-10-CM | POA: Diagnosis present

## 2023-08-02 DIAGNOSIS — Z5982 Transportation insecurity: Secondary | ICD-10-CM

## 2023-08-02 DIAGNOSIS — I739 Peripheral vascular disease, unspecified: Secondary | ICD-10-CM

## 2023-08-02 DIAGNOSIS — N185 Chronic kidney disease, stage 5: Secondary | ICD-10-CM | POA: Diagnosis present

## 2023-08-02 DIAGNOSIS — D696 Thrombocytopenia, unspecified: Secondary | ICD-10-CM | POA: Diagnosis present

## 2023-08-02 DIAGNOSIS — E11628 Type 2 diabetes mellitus with other skin complications: Secondary | ICD-10-CM | POA: Diagnosis present

## 2023-08-02 DIAGNOSIS — D649 Anemia, unspecified: Secondary | ICD-10-CM

## 2023-08-02 DIAGNOSIS — L089 Local infection of the skin and subcutaneous tissue, unspecified: Principal | ICD-10-CM

## 2023-08-02 DIAGNOSIS — E1140 Type 2 diabetes mellitus with diabetic neuropathy, unspecified: Secondary | ICD-10-CM | POA: Diagnosis present

## 2023-08-02 DIAGNOSIS — M8580 Other specified disorders of bone density and structure, unspecified site: Secondary | ICD-10-CM | POA: Diagnosis present

## 2023-08-02 DIAGNOSIS — D631 Anemia in chronic kidney disease: Secondary | ICD-10-CM | POA: Diagnosis present

## 2023-08-02 DIAGNOSIS — Z8249 Family history of ischemic heart disease and other diseases of the circulatory system: Secondary | ICD-10-CM

## 2023-08-02 DIAGNOSIS — I872 Venous insufficiency (chronic) (peripheral): Secondary | ICD-10-CM | POA: Diagnosis present

## 2023-08-02 DIAGNOSIS — E669 Obesity, unspecified: Secondary | ICD-10-CM | POA: Diagnosis present

## 2023-08-02 DIAGNOSIS — I959 Hypotension, unspecified: Secondary | ICD-10-CM | POA: Diagnosis present

## 2023-08-02 DIAGNOSIS — L97519 Non-pressure chronic ulcer of other part of right foot with unspecified severity: Secondary | ICD-10-CM | POA: Diagnosis present

## 2023-08-02 LAB — COMPREHENSIVE METABOLIC PANEL
ALT: 15 U/L (ref 0–44)
AST: 13 U/L — ABNORMAL LOW (ref 15–41)
Albumin: 4 g/dL (ref 3.5–5.0)
Alkaline Phosphatase: 131 U/L — ABNORMAL HIGH (ref 38–126)
Anion gap: 11 (ref 5–15)
BUN: 37 mg/dL — ABNORMAL HIGH (ref 6–20)
CO2: 24 mmol/L (ref 22–32)
Calcium: 7.8 mg/dL — ABNORMAL LOW (ref 8.9–10.3)
Chloride: 99 mmol/L (ref 98–111)
Creatinine, Ser: 6.21 mg/dL — ABNORMAL HIGH (ref 0.44–1.00)
GFR, Estimated: 8 mL/min — ABNORMAL LOW (ref >60)
Glucose, Bld: 157 mg/dL — ABNORMAL HIGH (ref 70–99)
Potassium: 4.1 mmol/L (ref 3.5–5.1)
Sodium: 134 mmol/L — ABNORMAL LOW (ref 135–145)
Total Bilirubin: 0.9 mg/dL (ref 0.3–1.2)
Total Protein: 7 g/dL (ref 6.5–8.1)

## 2023-08-02 LAB — CBC WITH DIFFERENTIAL/PLATELET
Abs Immature Granulocytes: 0.04 10*3/uL (ref 0.00–0.07)
Basophils Absolute: 0 10*3/uL (ref 0.0–0.1)
Basophils Relative: 1 %
Eosinophils Absolute: 0.2 10*3/uL (ref 0.0–0.5)
Eosinophils Relative: 2 %
HCT: 36.7 % (ref 36.0–46.0)
Hemoglobin: 11.4 g/dL — ABNORMAL LOW (ref 12.0–15.0)
Immature Granulocytes: 1 %
Lymphocytes Relative: 11 %
Lymphs Abs: 0.8 10*3/uL (ref 0.7–4.0)
MCH: 29.7 pg (ref 26.0–34.0)
MCHC: 31.1 g/dL (ref 30.0–36.0)
MCV: 95.6 fL (ref 80.0–100.0)
Monocytes Absolute: 0.8 10*3/uL (ref 0.1–1.0)
Monocytes Relative: 10 %
Neutro Abs: 6 10*3/uL (ref 1.7–7.7)
Neutrophils Relative %: 75 %
Platelets: 86 10*3/uL — ABNORMAL LOW (ref 150–400)
RBC: 3.84 MIL/uL — ABNORMAL LOW (ref 3.87–5.11)
RDW: 15.1 % (ref 11.5–15.5)
WBC: 7.8 10*3/uL (ref 4.0–10.5)
nRBC: 0 % (ref 0.0–0.2)

## 2023-08-02 LAB — I-STAT CG4 LACTIC ACID, ED
Lactic Acid, Venous: 0.8 mmol/L (ref 0.5–1.9)
Lactic Acid, Venous: 1 mmol/L (ref 0.5–1.9)

## 2023-08-02 LAB — CBG MONITORING, ED: Glucose-Capillary: 163 mg/dL — ABNORMAL HIGH (ref 70–99)

## 2023-08-02 MED ORDER — INSULIN ASPART 100 UNIT/ML IJ SOLN
0.0000 [IU] | INTRAMUSCULAR | Status: DC
  Administered 2023-08-02: 3 [IU] via SUBCUTANEOUS
  Administered 2023-08-03: 2 [IU] via SUBCUTANEOUS
  Administered 2023-08-03: 1 [IU] via SUBCUTANEOUS
  Administered 2023-08-04: 2 [IU] via SUBCUTANEOUS
  Administered 2023-08-04: 3 [IU] via SUBCUTANEOUS
  Administered 2023-08-04: 2 [IU] via SUBCUTANEOUS
  Administered 2023-08-05: 1 [IU] via SUBCUTANEOUS
  Administered 2023-08-05: 2 [IU] via SUBCUTANEOUS
  Administered 2023-08-05 (×2): 1 [IU] via SUBCUTANEOUS
  Administered 2023-08-06 (×2): 2 [IU] via SUBCUTANEOUS
  Administered 2023-08-06 (×3): 1 [IU] via SUBCUTANEOUS
  Administered 2023-08-06: 3 [IU] via SUBCUTANEOUS
  Administered 2023-08-07 (×2): 1 [IU] via SUBCUTANEOUS
  Administered 2023-08-07: 3 [IU] via SUBCUTANEOUS
  Administered 2023-08-07 – 2023-08-08 (×3): 1 [IU] via SUBCUTANEOUS

## 2023-08-02 MED ORDER — SODIUM CHLORIDE 0.9 % IV SOLN
2.0000 g | INTRAVENOUS | Status: AC
  Administered 2023-08-02 – 2023-08-04 (×2): 2 g via INTRAVENOUS
  Filled 2023-08-02 (×3): qty 20

## 2023-08-02 MED ORDER — METRONIDAZOLE 500 MG/100ML IV SOLN
500.0000 mg | Freq: Two times a day (BID) | INTRAVENOUS | Status: AC
  Administered 2023-08-03 – 2023-08-04 (×4): 500 mg via INTRAVENOUS
  Filled 2023-08-02 (×5): qty 100

## 2023-08-02 NOTE — Assessment & Plan Note (Addendum)
-  admit per  cellulitis protocol will            continue current antibiotic choice rocephin flagyl        plain films showed:  evidence of air (but lilkey due to deep ulcer  no evidence of osteomyelitis   no     foreign   objects      Will obtain MRSA screening,          further antibiotic adjustment pending above results Will need to let dr Lajoyce Corners know I nAM pt is been admitted

## 2023-08-02 NOTE — Assessment & Plan Note (Signed)
Write for cpap

## 2023-08-02 NOTE — Assessment & Plan Note (Signed)
Next HD tomorrow will need to let Nephrology know pt is being admitted

## 2023-08-02 NOTE — ED Notes (Signed)
ED TO INPATIENT HANDOFF REPORT  ED Nurse Name and Phone #: [phone]  S Name/Age/Gender Amy Moses 52 y.o. female Room/Bed: 006C/006C  Code Status   Code Status: Full Code  Home/SNF/Other Home Patient oriented to: self, place, time, and situation Is this baseline? Yes   Triage Complete: Triage complete  Chief Complaint Wound infection [T14.8XXA, L08.9]  Triage Note Pt arrives via POV. Pt reports wound to the bottom of her foot for the past 3 months with no improvement. Pt is a diabetic. Denies fever. Foot is currently bandaged.    Allergies Allergies  Allergen Reactions   Ace Inhibitors Cough    Level of Care/Admitting Diagnosis ED Disposition     ED Disposition  Admit   Condition  --   Comment  Hospital Area: [facility] [100100]  Level of Care: Telemetry Medical [104]  May place patient in observation at [facility] or [facility] if equivalent level of care is available:: No  Covid Evaluation: Asymptomatic - no recent exposure (last 10 days) testing not required  Diagnosis: Wound infection [875643]  Admitting Physician: Therisa Doyne [[id number]]  Attending Physician: Therisa Doyne [[id number]]          B Medical/Surgery History Past Medical History:  Diagnosis Date   Anemia    Blindness of right eye with low vision in contralateral eye    s/p victrectomy   Chronic diastolic heart failure (HCC) 03/30/2021   Diabetes mellitus, type II (HCC)    Dyslipidemia    Glaucoma    History of blood transfusion    Hypertension    Hypothyroidism (acquired)    Kidney disease    Stage 5   Mitral regurgitation 11/16/2022   Pneumonia    Pulmonary hypertension, unspecified (HCC) 09/12/2022   Past Surgical History:  Procedure Laterality Date   ABDOMINAL AORTOGRAM W/LOWER EXTREMITY Bilateral 12/18/2020   Procedure: ABDOMINAL AORTOGRAM W/LOWER EXTREMITY;  Surgeon: Sherren Kerns, MD;  Location: [facility];  Service:  Cardiovascular;  Laterality: Bilateral;   ABDOMINAL AORTOGRAM W/LOWER EXTREMITY Bilateral 01/25/2022   Procedure: ABDOMINAL AORTOGRAM W/LOWER EXTREMITY;  Surgeon: Nada Libman, MD;  Location: [facility];  Service: Cardiovascular;  Laterality: Bilateral;   AMPUTATION Right 02/16/2022   Procedure: RIGHT FOOT 5TH RAY AMPUTATION;  Surgeon: Nadara Mustard, MD;  Location: [facility] OR;  Service: Orthopedics;  Laterality: Right;   ANKLE FRACTURE SURGERY Right    AV FISTULA PLACEMENT Left 08/18/2020   Procedure: LEFT ARM BRACHIOCEPHALIC ARTERIOVENOUS (AV) FISTULA CREATION;  Surgeon: Sherren Kerns, MD;  Location: [facility] OR;  Service: Vascular;  Laterality: Left;   BIOPSY  04/24/2021   Procedure: BIOPSY;  Surgeon: Lanelle Bal, DO;  Location: [facility];  Service: Endoscopy;;   CESAREAN SECTION     CHOLECYSTECTOMY     COLONOSCOPY  04/24/2021   Surgeon: Lanelle Bal, DO;  nonbleeding internal hemorrhoids, 1 large (25 mm) pedunculated transverse colon polyp (prolapse type polyp) with adherent clot and stigmata of recent bleed.   COLONOSCOPY WITH PROPOFOL N/A 05/14/2021   Procedure: COLONOSCOPY WITH PROPOFOL;  Surgeon: Corbin Ade, MD;  Location: [facility];  Service: Endoscopy;  Laterality: N/A;   COLONOSCOPY WITH PROPOFOL N/A 03/17/2023   Procedure: COLONOSCOPY WITH PROPOFOL;  Surgeon: Corbin Ade, MD;  [facility]: [facility];  Service: Endoscopy;  Laterality: N/A;   ESOPHAGOGASTRODUODENOSCOPY (EGD) WITH PROPOFOL N/A 04/24/2021   Surgeon: Lanelle Bal, DO;  duodenal erosions and gastritis biopsied (pathology with peptic  duodenitis, reactive gastropathy with erosions/chronic inflammation, negative for H. pylori)   EYE SURGERY     Vatrectomy   HEMOSTASIS CLIP PLACEMENT  05/14/2021   Procedure: HEMOSTASIS CLIP PLACEMENT;  Surgeon: Corbin Ade, MD;  [facility]: [facility];  Service: Endoscopy;;   IR PERC TUN PERIT CATH WO PORT S&I /IMAG  09/15/2020   IR REMOVAL TUN CV  CATH W/O FL  02/19/2021   IR US GUIDE VASC ACCESS RIGHT  09/15/2020   POLYPECTOMY  04/24/2021   Procedure: POLYPECTOMY;  Surgeon: Lanelle Bal, DO;  [facility];  Service: Endoscopy;;   POLYPECTOMY  05/14/2021   Procedure: POLYPECTOMY;  Surgeon: Corbin Ade, MD;  [facility];  Service: Endoscopy;;   POLYPECTOMY  03/17/2023   Procedure: POLYPECTOMY;  Surgeon: Corbin Ade, MD;  [facility];  Service: Endoscopy;;   SKIN SPLIT GRAFT Bilateral 09/03/2021   Procedure: SKIN GRAFT BILATERAL LEGS;  Surgeon: Nadara Mustard, MD;  Location: [facility] OR;  Service: Orthopedics;  Laterality: Bilateral;   SKIN SPLIT GRAFT Left 12/10/2021   Procedure: IRRIGATION AND DEBRIDEMENT LEFT CALF, APPLICATION SPLIT THICKNESS SKIN GRAFT;  Surgeon: Nadara Mustard, MD;  Location: [facility] OR;  Service: Orthopedics;  Laterality: Left;   TOE SURGERY       A IV Location/Drains/Wounds Patient Lines/Drains/Airways Status     Active Line/Drains/Airways     Name Placement date Placement time Site Days   Peripheral IV 08/02/23 20 G Right Forearm 08/02/23  1835  Forearm  less than 1   Fistula / Graft Left Upper arm Arteriovenous fistula --  --  Upper arm  --   Pressure Injury 09/03/21 Thigh Anterior;Distal;Left 09/03/21  1724  -- 698   Pressure Injury 10/01/22 Thigh Left Stage 3 -  Full thickness tissue loss. Subcutaneous fat may be visible but bone, tendon or muscle are NOT exposed. stage 3 to right posterior thigh measuring 3 cm x 1 cm 10/01/22  0408  -- 305   Pressure Injury 10/01/22 Thigh Right Stage 2 -  Partial thickness loss of dermis presenting as a shallow open injury with a red, pink wound bed without slough. stage 2 to right thigh measuring 1cm x 1 cm 10/01/22  0409  -- 305   Pressure Injury 10/01/22 Thigh Right Stage 3 -  Full thickness tissue loss. Subcutaneous fat may be visible but bone, tendon or muscle are NOT exposed. stage 3 to sacrum measuring 3 cmx 1 cm 10/01/22  0410  --  305   Pressure Injury 03/16/23 Thigh Left;Posterior;Proximal Stage 2 -  Partial thickness loss of dermis presenting as a shallow open injury with a red, pink wound bed without slough. wound base is red, bleeding 03/16/23  0001  -- 139   Pressure Injury 03/23/23 Heel Right Unstageable - Full thickness tissue loss in which the base of the injury is covered by slough (yellow, tan, gray, green or brown) and/or eschar (tan, brown or black) in the wound bed. 03/23/23  0800  -- 132   Wound / Incision (Open or Dehisced) 09/03/21 Other (Comment) Leg Left;Posterior calciphylaxis 09/03/21  1214  Leg  698   Wound / Incision (Open or Dehisced) 01/24/22 Foot Anterior;Right;Lateral 01/24/22  2300  Foot  555   Wound / Incision (Open or Dehisced) 01/24/22 Tibial Left;Posterior;Proximal;Bilateral;Right 01/24/22  2300  Tibial  555   Wound / Incision (Open or Dehisced) 04/30/22 Non-pressure wound Foot Right;Lateral 04/30/22  1200  Foot  459  Wound / Incision (Open or Dehisced) 03/23/23 Non-pressure wound Right;Anterior 03/23/23  0800  --  132            Intake/Output Last 24 hours No intake or output data in the 24 hours ending 08/02/23 2301  Labs/Imaging Results for orders placed or performed during the hospital encounter of 08/02/23 (from the past 48 hour(s))  Comprehensive metabolic panel     Status: Abnormal   Collection Time: 08/02/23  2:38 PM  Result Value Ref Range   Sodium 134 (L) 135 - 145 mmol/L   Potassium 4.1 3.5 - 5.1 mmol/L   Chloride 99 98 - 111 mmol/L   CO2 24 22 - 32 mmol/L   Glucose, Bld 157 (H) 70 - 99 mg/dL    Comment: Glucose reference range applies only to samples taken after fasting for at least 8 hours.   BUN 37 (H) 6 - 20 mg/dL   Creatinine, Ser 1.61 (H) 0.44 - 1.00 mg/dL   Calcium 7.8 (L) 8.9 - 10.3 mg/dL   Total Protein 7.0 6.5 - 8.1 g/dL   Albumin 4.0 3.5 - 5.0 g/dL   AST 13 (L) 15 - 41 U/L   ALT 15 0 - 44 U/L   Alkaline Phosphatase 131 (H) 38 - 126 U/L   Total Bilirubin  0.9 0.3 - 1.2 mg/dL   GFR, Estimated 8 (L) >60 mL/min    Comment: (NOTE) Calculated using the CKD-EPI Creatinine Equation (2021)    Anion gap 11 5 - 15    Comment: Performed at [facility], [address], [place], [place] [address]  CBC with Differential     Status: Abnormal   Collection Time: 08/02/23  2:38 PM  Result Value Ref Range   WBC 7.8 4.0 - 10.5 K/uL   RBC 3.84 (L) 3.87 - 5.11 MIL/uL   Hemoglobin 11.4 (L) 12.0 - 15.0 g/dL   HCT 54.0 98.1 - 19.1 %   MCV 95.6 80.0 - 100.0 fL   MCH 29.7 26.0 - 34.0 pg   MCHC 31.1 30.0 - 36.0 g/dL   RDW 47.8 29.5 - 62.1 %   Platelets 86 (L) 150 - 400 K/uL    Comment: Immature Platelet Fraction may be clinically indicated, consider ordering this additional test [id number] REPEATED TO VERIFY    nRBC 0.0 0.0 - 0.2 %   Neutrophils Relative % 75 %   Neutro Abs 6.0 1.7 - 7.7 K/uL   Lymphocytes Relative 11 %   Lymphs Abs 0.8 0.7 - 4.0 K/uL   Monocytes Relative 10 %   Monocytes Absolute 0.8 0.1 - 1.0 K/uL   Eosinophils Relative 2 %   Eosinophils Absolute 0.2 0.0 - 0.5 K/uL   Basophils Relative 1 %   Basophils Absolute 0.0 0.0 - 0.1 K/uL   Immature Granulocytes 1 %   Abs Immature Granulocytes 0.04 0.00 - 0.07 K/uL    Comment: Performed at [name], [address] [place], [place] [address]  I-Stat Lactic Acid, ED     Status: None   Collection Time: 08/02/23  2:42 PM  Result Value Ref Range   Lactic Acid, Venous 0.8 0.5 - 1.9 mmol/L  I-Stat Lactic Acid, ED     Status: None   Collection Time: 08/02/23  6:50 PM  Result Value Ref Range   Lactic Acid, Venous 1.0 0.5 - 1.9 mmol/L  POC CBG, ED     Status: Abnormal   Collection Time: 08/02/23 10:46 PM  Result Value  Ref Range   Glucose-Capillary 163 (H) 70 - 99 mg/dL    Comment: Glucose reference range applies only to samples taken after fasting for at least 8 hours.   DG Foot Complete Right  Result Date: 08/02/2023 CLINICAL DATA:  Right foot wound EXAM: RIGHT FOOT COMPLETE  - 3 VIEW COMPARISON:  X-ray 05/10/2023. Images only. There is no report available. FINDINGS: Fixation hardware seen about the distal tibia and fibula at the edge of the imaging field. There is severe osteopenia. Global soft tissue swelling about the foot with some plantar soft tissue gas at the level of the midfoot on the lateral view. Severe osteopenia with multifocal areas of bony deformity consistent with old trauma there is also hallux valgus deformity of the first ray with hypertrophic degenerative changes. 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 No definite erosive changes identified at this time. Vascular calcifications are seen. Plantar calcaneal spur. If there is further concern of bone infection, follow up MRI or bone scan may be useful for further sensitivity. IMPRESSION: Extensive soft tissue swelling about the foot with some new plantar soft tissue gas. Please correlate with clinical findings. Previous surgical changes from amputation of fifth ray, fixation hardware about the ankle. Severe osteopenia with degenerative changes and posttraumatic changes. Electronically Signed   By: Karen Kays M.D.   On: 08/02/2023 15:54    Pending Labs Unresulted Labs (From admission, onward)     Start     Ordered   08/03/23 0500  Vitamin B12  (Anemia Panel (PNL))  Tomorrow morning,   R        08/02/23 2142   08/03/23 0500  Folate  (Anemia Panel (PNL))  Tomorrow morning,   R        08/02/23 2142   08/03/23 0500  Prealbumin  Tomorrow morning,   R        08/02/23 2142   08/02/23 2145  Hemoglobin A1c  Add-on,   AD       Comments: To assess prior glycemic control    08/02/23 2144   08/02/23 2143  Iron and TIBC  (Anemia Panel (PNL))  Add-on,   AD        08/02/23 2142   08/02/23 2143  Ferritin  (Anemia Panel (PNL))  Add-on,   AD        08/02/23 2142   08/02/23 2143  Reticulocytes  (Anemia Panel (PNL))  Add-on,   AD        08/02/23 2142   08/02/23 2143   Procalcitonin  Add-on,   AD       References:    Procalcitonin Lower Respiratory Tract Infection AND Sepsis Procalcitonin Algorithm   08/02/23 2142   08/02/23 2143  TSH  Add-on,   AD        08/02/23 2142   08/02/23 2143  Hepatic function panel  Add-on,   AD       Question:  Release to patient  Answer:  Immediate   08/02/23 2142   08/02/23 1754  Blood Cultures x 2 sites  BLOOD CULTURE X 2,   STAT      08/02/23 1753   Signed and Held  Magnesium  Tomorrow morning,   R        Signed and Held   Signed and Held  Phosphorus  Tomorrow morning,   R        Signed and Held   Signed and Held  Comprehensive metabolic panel  Tomorrow morning,   R       Question:  Release to patient  Answer:  Immediate   Signed and Held   Signed and Held  CBC  Tomorrow morning,   R       Question:  Release to patient  Answer:  Immediate   Signed and Held            Vitals/Pain Today's Vitals   08/02/23 1430 08/02/23 1808 08/02/23 1815 08/02/23 1830  BP:  112/69 107/67 112/67  Pulse:  77 77 75  Resp:  18    Temp:  98.6 F (37 C)    TempSrc:  Oral    SpO2:  100% 100% 100%  Weight: 97.5 kg     Height: 5\' 6"  (1.676 m)     PainSc: 0-No pain       Isolation Precautions No active isolations  Medications Medications  insulin aspart (novoLOG) injection 0-6 Units (3 Units Subcutaneous Given 08/02/23 2254)    Mobility manual wheelchair     Focused Assessments Wound infection to left heel   R Recommendations: See Admitting Provider Note  Report given to:   Additional Notes:

## 2023-08-02 NOTE — H&P (Signed)
Amy Moses FTD:[id number] DOB: Apr 13, 1971 DOA: 08/02/2023     PCP: Lorelei Pont, DO   Outpatient Specialists:  CARDS:  Dr. Chilton Si, MD  NEphrology:    in [place] [facility]     Patient arrived to ER on 08/02/23 at 1345 Referred by Attending Coral Spikes, DO   Patient coming from:    home Lives  With family    Chief Complaint:   Chief Complaint  Patient presents with   Wound Infection    HPI: Amy Moses is a 52 y.o. female with medical history significant of DM2, ESRD, chronic foot infection, calciphylaxis,     Presented with  right foot wound Presents with a wound on the bottom of her foot for past 3 months which does not seem to be getting better she is diabetic no associated fevers She has history of nonhealing foot ulcers in the past as well as calciphylaxis And end-stage diabetes last hemodialysis was yesterday she has home health nurse who has been doing compression dressings to her legs but this morning the wound care nurse seen her and was concerned because it was more draining and her foot appears to be red no fevers or chills patient is diabetic neuropathy    Denies significant ETOH intake   Does not smoke   Lab Results  Component Value Date   SARSCOV2NAA NEGATIVE 06/24/2023   SARSCOV2NAA NEGATIVE 04/29/2022   SARSCOV2NAA NEGATIVE 04/29/2022   SARSCOV2NAA NEGATIVE 01/24/2022        Regarding pertinent Chronic problems:    Hyperlipidemia -  on statins Lipitor (atorvastatin)  Lipid Panel     Component Value Date/Time   CHOL 85 01/26/2022 0555   TRIG 98 01/26/2022 0555   HDL 25 (L) 01/26/2022 0555   CHOLHDL 3.4 01/26/2022 0555   VLDL 20 01/26/2022 0555   LDLCALC 40 01/26/2022 0555    Hypotension need midodrine   chronic CHF diastolic/systolic/ combined - last echo  Recent Results (from the past 06237 hour(s))  ECHOCARDIOGRAM COMPLETE   Collection Time: 07/27/22  9:08 AM  Result Value   Weight 3,280   Height 66    Area-P 1/2 3.28   S' Lateral 4.25   Narrative      ECHOCARDIOGRAM REPORT     IMPRESSIONS    1. Left ventricular ejection fraction, by estimation, is 50 to 55%. The left ventricle has low normal function. The left ventricle has no regional wall motion abnormalities. The left ventricular internal cavity size was mildly dilated. Left ventricular  diastolic parameters are indeterminate. Elevated left ventricular end-diastolic pressure.  2. Right ventricular systolic function is mildly reduced. The right ventricular size is moderately enlarged. There is severely elevated pulmonary artery systolic pressure. The estimated right ventricular systolic pressure is 64.2 mmHg.  3. Left atrial size was moderately dilated.  4. Right atrial size was mildly dilated.  5. The mitral valve is degenerative. Moderate to severe mitral valve regurgitation. No evidence of mitral stenosis.  6. Tricuspid valve regurgitation is severe.  7. The aortic valve is calcified. There is moderate calcification of the aortic valve. There is severe thickening of the aortic valve. Aortic valve regurgitation is not visualized. Aortic valve sclerosis/calcification is present, without any evidence of  aortic stenosis.  8. The inferior vena cava is dilated in size with >50% respiratory variability, suggesting right atrial pressure of 8 mmHg.  9. Small to moderate circumferential pericardial effusion with no Rv diastolic collapse or RA inversion. Respirophasic changes in MV inflow  velocities was < 25%.            DM 2 -  Lab Results  Component Value Date   HGBA1C 9.4 (H) 10/01/2022   on insulin,    Hypothyroidism:   Lab Results  Component Value Date   TSH 3.109 01/24/2022   on synthroid   obesity-   BMI Readings from Last 1 Encounters:  08/02/23 34.70 kg/m       OSA -on nocturnal  CPAP     ESRD -  Lab Results  Component Value Date   CREATININE 6.21 (H) 08/02/2023   CREATININE 5.91 (H) 06/24/2023   CREATININE  6.45 (H) 03/25/2023   Lab Results  Component Value Date   NA 134 (L) 08/02/2023   CL 99 08/02/2023   K 4.1 08/02/2023   CO2 24 08/02/2023   BUN 37 (H) 08/02/2023   CREATININE 6.21 (H) 08/02/2023   GFRNONAA 8 (L) 08/02/2023   CALCIUM 7.8 (L) 08/02/2023   PHOS 4.9 (H) 03/25/2023   ALBUMIN 4.0 08/02/2023   GLUCOSE 157 (H) 08/02/2023    Chronic anemia - baseline hg Hemoglobin & Hematocrit  Recent Labs    03/24/23 0446 06/24/23 2233 08/02/23 1438  HGB 9.5* 10.6* 11.4*   Iron/TIBC/Ferritin/ %Sat    Component Value Date/Time   IRON 97 02/17/2022 0202   TIBC 171 (L) 02/17/2022 0202   FERRITIN 414 (H) 02/17/2022 0202   IRONPCTSAT 57 (H) 02/17/2022 0202     While in ER:   Plain imaging showed gas Plan for patient to be seen by Dr. Lajoyce Corners in the morning     Lab Orders         Blood Cultures x 2 sites         Comprehensive metabolic panel         CBC with Differential         I-Stat Lactic Acid, ED         POC CBG, ED     Foot film Extensive soft tissue swelling about the foot with some new plantar soft tissue gas. Please correlate with clinical findings.   Previous surgical changes from amputation of fifth ray, fixation hardware about the ankle. Severe osteopenia with degenerative changes and posttraumatic changes.  Following Medications were ordered in ER: Medications - No data to display  ______    ED Triage Vitals  Encounter Vitals Group     BP 08/02/23 1419 120/70     Systolic BP Percentile --      Diastolic BP Percentile --      Pulse Rate 08/02/23 1419 80     Resp 08/02/23 1419 16     Temp 08/02/23 1419 98.1 F (36.7 C)     Temp Source 08/02/23 1419 Oral     SpO2 08/02/23 1419 91 %     Weight 08/02/23 1430 215 lb (97.5 kg)     Height 08/02/23 1430 5\' 6"  (1.676 m)     Head Circumference --      Peak Flow --      Pain Score 08/02/23 1430 0     Pain Loc --      Pain Education --      Exclude from Growth Chart --   WUXL(24)@      _________________________________________ Significant initial  Findings: Abnormal Labs Reviewed  COMPREHENSIVE METABOLIC PANEL - Abnormal; Notable for the following components:      Result Value   Sodium 134 (*)    Glucose, Bld 157 (*)  BUN 37 (*)    Creatinine, Ser 6.21 (*)    Calcium 7.8 (*)    AST 13 (*)    Alkaline Phosphatase 131 (*)    GFR, Estimated 8 (*)    All other components within normal limits  CBC WITH DIFFERENTIAL/PLATELET - Abnormal; Notable for the following components:   RBC 3.84 (*)    Hemoglobin 11.4 (*)    Platelets 86 (*)    All other components within normal limits     ECG: Ordered   The recent clinical data is shown below. Vitals:   08/02/23 1430 08/02/23 1808 08/02/23 1815 08/02/23 1830  BP:  112/69 107/67 112/67  Pulse:  77 77 75  Resp:  18    Temp:  98.6 F (37 C)    TempSrc:  Oral    SpO2:  100% 100% 100%  Weight: 97.5 kg     Height: 5\' 6"  (1.676 m)         WBC     Component Value Date/Time   WBC 7.8 08/02/2023 1438   LYMPHSABS 0.8 08/02/2023 1438   MONOABS 0.8 08/02/2023 1438   EOSABS 0.2 08/02/2023 1438   BASOSABS 0.0 08/02/2023 1438    Lactic Acid, Venous    Component Value Date/Time   LATICACIDVEN 1.0 08/02/2023 1850     Procalcitonin   Ordered       ABX started rocephin /flagyl   ___________________________________________ Recent Labs  Lab 08/02/23 1438  NA 134*  K 4.1  CO2 24  GLUCOSE 157*  BUN 37*  CREATININE 6.21*  CALCIUM 7.8*    Cr   stable,  Lab Results  Component Value Date   CREATININE 6.21 (H) 08/02/2023   CREATININE 5.91 (H) 06/24/2023   CREATININE 6.45 (H) 03/25/2023    Recent Labs  Lab 08/02/23 1438  AST 13*  ALT 15  ALKPHOS 131*  BILITOT 0.9  PROT 7.0  ALBUMIN 4.0   Lab Results  Component Value Date   CALCIUM 7.8 (L) 08/02/2023   PHOS 4.9 (H) 03/25/2023    Plt: Lab Results  Component Value Date   PLT 86 (L) 08/02/2023       Recent Labs  Lab 08/02/23 1438  WBC 7.8   NEUTROABS 6.0  HGB 11.4*  HCT 36.7  MCV 95.6  PLT 86*    HG/HCT  stable,      Component Value Date/Time   HGB 11.4 (L) 08/02/2023 1438   HCT 36.7 08/02/2023 1438   MCV 95.6 08/02/2023 1438    _______________________________________________ Hospitalist was called for admission for Diabetic foot ulcer   The following Work up has been ordered so far:  Orders Placed This Encounter  Procedures   Blood Cultures x 2 sites   DG Foot Complete Right   Comprehensive metabolic panel   CBC with Differential   Initiate Carrier Fluid Protocol   Consult for Unassigned Medical Admission   I-Stat Lactic Acid, ED   POC CBG, ED     OTHER Significant initial  Findings:  labs showing:     DM  labs:  HbA1C: Recent Labs    10/01/22 0456  HGBA1C 9.4*    CBG (last 3)  No results for input(s): "GLUCAP" in the last 72 hours.        Cultures:    Component Value Date/Time   SDES  04/29/2022 2241    RIGHT ANTECUBITAL BOTTLES DRAWN AEROBIC AND ANAEROBIC Performed at [facility], [address], [place], [place] [address]  SPECREQUEST  04/29/2022 2241    Blood Culture adequate volume Performed at [facility], [address] [place], [place] [address]    CULT (A) 04/29/2022 2241    CITROBACTER KOSERI SUSCEPTIBILITIES PERFORMED ON PREVIOUS CULTURE WITHIN THE LAST 5 DAYS. Performed at [facility], [address] [place], [place] [address]    REPTSTATUS 05/02/2022 FINAL 04/29/2022 2241     Radiological Exams on Admission: DG Foot Complete Right  Result Date: 08/02/2023 CLINICAL DATA:  Right foot wound EXAM: RIGHT FOOT COMPLETE - 3 VIEW COMPARISON:  X-ray 05/10/2023. Images only. There is no report available. FINDINGS: Fixation hardware seen about the distal tibia and fibula at the edge of the imaging field. There is severe osteopenia. Global soft tissue swelling about the foot with some plantar soft tissue gas at the level of the midfoot on the lateral view. Severe  osteopenia with multifocal areas of bony deformity consistent with old trauma there is also hallux valgus deformity of the first ray with hypertrophic degenerative changes. 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 No definite erosive changes identified at this time. Vascular calcifications are seen. Plantar calcaneal spur. If there is further concern of bone infection, follow up MRI or bone scan may be useful for further sensitivity. IMPRESSION: Extensive soft tissue swelling about the foot with some new plantar soft tissue gas. Please correlate with clinical findings. Previous surgical changes from amputation of fifth ray, fixation hardware about the ankle. Severe osteopenia with degenerative changes and posttraumatic changes. Electronically Signed   By: Karen Kays M.D.   On: 08/02/2023 15:54   _______________________________________________________________________________________________________ Latest  Blood pressure 112/67, pulse 75, temperature 98.6 F (37 C), temperature source Oral, resp. rate 18, height 5\' 6"  (1.676 m), weight 97.5 kg, last menstrual period 08/22/2015, SpO2 100%.   Vitals  labs and radiology finding personally reviewed  Review of Systems:    Pertinent positives include: Foot ulcer with increased discharge  Constitutional:  No weight loss, night sweats, Fevers, chills, fatigue, weight loss  HEENT:  No headaches, Difficulty swallowing,Tooth/dental problems,Sore throat,  No sneezing, itching, ear ache, nasal congestion, post nasal drip,  Cardio-vascular:  No chest pain, Orthopnea, PND, anasarca, dizziness, palpitations.no Bilateral lower extremity swelling  GI:  No heartburn, indigestion, abdominal pain, nausea, vomiting, diarrhea, change in bowel habits, loss of appetite, melena, blood in stool, hematemesis Resp:  no shortness of breath at rest. No dyspnea on exertion, No excess mucus, no productive cough, No non-productive cough,  No coughing up of blood.No change in color of mucus.No wheezing. Skin:  no rash or lesions. No jaundice GU:  no dysuria, change in color of urine, no urgency or frequency. No straining to urinate.  No flank pain.  Musculoskeletal:  No joint pain or no joint swelling. No decreased range of motion. No back pain.  Psych:  No change in mood or affect. No depression or anxiety. No memory loss.  Neuro: no localizing neurological complaints, no tingling, no weakness, no double vision, no gait abnormality, no slurred speech, no confusion  All systems reviewed and apart from HOPI all are negative _______________________________________________________________________________________________ Past Medical History:   Past Medical History:  Diagnosis Date   Anemia    Blindness of right eye with low vision in contralateral eye    s/p victrectomy   Chronic diastolic heart failure (HCC) 03/30/2021   Diabetes mellitus, type II (HCC)    Dyslipidemia    Glaucoma    History of blood  transfusion    Hypertension    Hypothyroidism (acquired)    Kidney disease    Stage 5   Mitral regurgitation 11/16/2022   Pneumonia    Pulmonary hypertension, unspecified (HCC) 09/12/2022      Past Surgical History:  Procedure Laterality Date   ABDOMINAL AORTOGRAM W/LOWER EXTREMITY Bilateral 12/18/2020   Procedure: ABDOMINAL AORTOGRAM W/LOWER EXTREMITY;  Surgeon: Sherren Kerns, MD;  Location: [facility];  Service: Cardiovascular;  Laterality: Bilateral;   ABDOMINAL AORTOGRAM W/LOWER EXTREMITY Bilateral 01/25/2022   Procedure: ABDOMINAL AORTOGRAM W/LOWER EXTREMITY;  Surgeon: Nada Libman, MD;  Location: [facility];  Service: Cardiovascular;  Laterality: Bilateral;   AMPUTATION Right 02/16/2022   Procedure: RIGHT FOOT 5TH RAY AMPUTATION;  Surgeon: Nadara Mustard, MD;  Location: [facility] OR;  Service: Orthopedics;  Laterality: Right;   ANKLE FRACTURE SURGERY Right    AV FISTULA PLACEMENT Left  08/18/2020   Procedure: LEFT ARM BRACHIOCEPHALIC ARTERIOVENOUS (AV) FISTULA CREATION;  Surgeon: Sherren Kerns, MD;  Location: [facility] OR;  Service: Vascular;  Laterality: Left;   BIOPSY  04/24/2021   Procedure: BIOPSY;  Surgeon: Lanelle Bal, DO;  Location: [facility];  Service: Endoscopy;;   CESAREAN SECTION     CHOLECYSTECTOMY     COLONOSCOPY  04/24/2021   Surgeon: Lanelle Bal, DO;  nonbleeding internal hemorrhoids, 1 large (25 mm) pedunculated transverse colon polyp (prolapse type polyp) with adherent clot and stigmata of recent bleed.   COLONOSCOPY WITH PROPOFOL N/A 05/14/2021   Procedure: COLONOSCOPY WITH PROPOFOL;  Surgeon: Corbin Ade, MD;  Location: [facility];  Service: Endoscopy;  Laterality: N/A;   COLONOSCOPY WITH PROPOFOL N/A 03/17/2023   Procedure: COLONOSCOPY WITH PROPOFOL;  Surgeon: Corbin Ade, MD;  [facility]: [facility];  Service: Endoscopy;  Laterality: N/A;   ESOPHAGOGASTRODUODENOSCOPY (EGD) WITH PROPOFOL N/A 04/24/2021   Surgeon: Lanelle Bal, DO;  duodenal erosions and gastritis biopsied (pathology with peptic duodenitis, reactive gastropathy with erosions/chronic inflammation, negative for H. pylori)   EYE SURGERY     Vatrectomy   HEMOSTASIS CLIP PLACEMENT  05/14/2021   Procedure: HEMOSTASIS CLIP PLACEMENT;  Surgeon: Corbin Ade, MD;  [facility]: [facility];  Service: Endoscopy;;   IR PERC TUN PERIT CATH WO PORT S&I /IMAG  09/15/2020   IR REMOVAL TUN CV CATH W/O FL  02/19/2021   IR US GUIDE VASC ACCESS RIGHT  09/15/2020   POLYPECTOMY  04/24/2021   Procedure: POLYPECTOMY;  Surgeon: Lanelle Bal, DO;  [facility];  Service: Endoscopy;;   POLYPECTOMY  05/14/2021   Procedure: POLYPECTOMY;  Surgeon: Corbin Ade, MD;  [facility]: [facility];  Service: Endoscopy;;   POLYPECTOMY  03/17/2023   Procedure: POLYPECTOMY;  Surgeon: Corbin Ade, MD;  [facility]: [facility];  Service: Endoscopy;;   SKIN SPLIT GRAFT  Bilateral 09/03/2021   Procedure: SKIN GRAFT BILATERAL LEGS;  Surgeon: Nadara Mustard, MD;  Location: [facility] OR;  Service: Orthopedics;  Laterality: Bilateral;   SKIN SPLIT GRAFT Left 12/10/2021   Procedure: IRRIGATION AND DEBRIDEMENT LEFT CALF, APPLICATION SPLIT THICKNESS SKIN GRAFT;  Surgeon: Nadara Mustard, MD;  Location: [facility] OR;  Service: Orthopedics;  Laterality: Left;   TOE SURGERY      Social History:  Ambulatory    wheelchair bound,       reports that she has never smoked. She has never used smokeless tobacco. She reports that she does not drink alcohol and does not  use drugs.     Family History:   Family History  Problem Relation Age of Onset   Heart failure Mother    Heart disease Mother    Diabetes Mother    Kidney disease Mother    Heart failure Father    Diabetes Father    Heart disease Father    Diabetes Brother    Heart failure Maternal Grandmother    Heart failure Maternal Grandfather    Transient ischemic attack Maternal Grandfather    Colon cancer Neg Hx    ______________________________________________________________________________________________ Allergies: Allergies  Allergen Reactions   Ace Inhibitors Cough     Prior to Admission medications   Medication Sig Start Date End Date Taking? Authorizing Provider  acetaminophen (TYLENOL) 500 MG tablet Take 1,000 mg by mouth every 6 (six) hours as needed for moderate pain.    [provider]  atorvastatin (LIPITOR) 10 MG tablet Take 10 mg by mouth daily.    [provider]  AURYXIA 1 GM 210 MG(Fe) tablet Take 630 mg by mouth 3 (three) times daily with meals. 01/07/22   [provider]  Blood Glucose Monitoring Suppl (ACCU-CHEK GUIDE ME) w/Device KIT 1 Piece by Does not apply route as directed. 03/16/22   Roma Kayser, MD  Blood Pressure Monitor KIT TAKE BLOOD PRESSURE DAILY 09/15/22   Chilton Si, MD  Continuous Blood Gluc Receiver (FREESTYLE LIBRE 2 READER) DEVI As  directed 04/06/22   Roma Kayser, MD  Continuous Blood Gluc Sensor (FREESTYLE LIBRE 2 SENSOR) MISC 1 Piece by Does not apply route every 14 (fourteen) days. 04/06/22   Roma Kayser, MD  glucose blood (ACCU-CHEK GUIDE) test strip Use as instructed 03/16/22   Roma Kayser, MD  HUMALOG KWIKPEN 100 UNIT/ML KwikPen Inject 5-11 Units into the skin 3 (three) times daily with meals. If eats 50% or more of meal. Patient taking differently: Inject 5-11 Units into the skin 3 (three) times daily with meals. If eats 50% or more of meal. Sliding scale 03/16/22   Nida, Denman George, MD  insulin glargine (LANTUS) 100 UNIT/ML injection Inject 0.2 mLs (20 Units total) into the skin at bedtime. Patient taking differently: Inject 8 Units into the skin at bedtime. 04/06/22   Roma Kayser, MD  levothyroxine (SYNTHROID) 75 MCG tablet Take 75 mcg by mouth daily. 06/13/22   [provider]  midodrine (PROAMATINE) 10 MG tablet Take 10 mg by mouth daily. 09/27/22   [provider]  pantoprazole (PROTONIX) 40 MG tablet Take 1 tablet (40 mg total) by mouth 2 (two) times daily. 04/25/21   Shon Hale, MD  polyethylene glycol powder (MIRALAX) 17 GM/SCOOP powder Take 17 g by mouth daily. 03/17/23   Narda Bonds, MD  Vitamin D, Ergocalciferol, (DRISDOL) 1.25 MG (50000 UNIT) CAPS capsule Take 50,000 Units by mouth every 7 (seven) days. Friday 04/20/21   [provider]  Wheat Dextrin (BENEFIBER) POWD Stir 2 teaspoons of Benefiber Original into 4-8 oz. of beverage or soft food (hot or cold), three times daily. Stir well until dissolved (up to 60 seconds.) Patient not taking: Reported on 07/20/2023 03/17/23   Narda Bonds, MD    ___________________________________________________________________________________________________ Physical Exam:    08/02/2023    6:30 PM 08/02/2023    6:15 PM 08/02/2023    6:08 PM  Vitals with BMI  Systolic 112 107 960  Diastolic 67 67 69   Pulse 75 77 77     1. General:  in No  Acute distress   Chronically ill-appearing 2. Psychological: Alert and    Oriented 3. Head/ENT:   Moist  Mucous Membranes                          Head Non traumatic, neck supple                            Poor Dentition 4. SKIN: decreased Skin turgor,  Skin clean Dry evidence of redness of right foot there is an ulcer present on the bottom of right foot as well no foul odor noted redness and areas of breakdown on tibia as well           5. Heart: Regular rate and rhythm no  Murmur, no Rub or gallop 6. Lungs: no wheezes or crackles   7. Abdomen: Soft,  non-tender, Non distended   obese   8. Lower extremities: no clubbing, cyanosis,  2+edema redness of right foot 9. Neurologically Grossly intact, moving all 4 extremities equally   strength 5 out of 5 in all 4 extremities cranial nerves II through XII intact 10. MSK: Normal range of motion    Chart has been reviewed  ______________________________________________________________________________________________  Assessment/Plan  52 y.o. female with medical history significant of DM2, ESRD, chronic foot infection, calciphylaxis,     Admitted for wound infection    Present on Admission:  Wound infection  Type 2 diabetes mellitus with ESRD (end-stage renal disease) (HCC)  Acquired hypothyroidism  Mixed hyperlipidemia  Normocytic anemia  Chronic diastolic heart failure (HCC)  Primary hypertension     ESRD (end stage renal disease) on dialysis (HCC) Next HD tomorrow will need to let Nephrology know pt is being admitted  Type 2 diabetes mellitus with ESRD (end-stage renal disease) (HCC)  - Order Sensitive SSI   - continue home insulin but decreased to  5 units,  -  check TSH and HgA1C     Acquired hypothyroidism - Check TSH continue home medications Synthroid at 75 mcg po q day   Mixed hyperlipidemia Chronic cont lipitor 10 mg po qday  Normocytic anemia Chronic stable  add on anemia panel  Wound infection -admit per  cellulitis protocol will            continue current antibiotic choice rocephin flagyl        plain films showed:  evidence of air (but lilkey due to deep ulcer  no evidence of osteomyelitis   no     foreign   objects      Will obtain MRSA screening,          further antibiotic adjustment pending above results Will need to let dr Lajoyce Corners know I nAM pt is been admitted   Chronic diastolic heart failure (HCC) Currently appears euvolemic continue to monitor  Primary hypertension Now need midodrine support  Sacral pressure ulcer Wound care consult  OSA (obstructive sleep apnea) Write for cpap   Other plan as per orders.  DVT prophylaxis:  SCD     Code Status:    Code Status: Prior FULL CODE   as per patient   I had personally discussed CODE STATUS with patient and family   ACP   none    Family Communication:   Family at  Bedside  plan of care was discussed Daughter,    Diet  npo after 5 pm   Disposition Plan:     To  home once workup is complete and patient is stable   Following barriers for discharge:                            able to transition to PO antibiotics                                                        Will need consultants to evaluate patient prior to discharge                          Consults called:  Nephrology is aware Sent msg to Dr. Lajoyce Corners, may need official consult in AM   Admission status:  ED Disposition     ED Disposition  Admit   Condition  --   Comment  Hospital Area: [facility] [[id number]]  Level of Care: Telemetry Medical [[id number]]  May place patient in observation at [facility] or [facility] if equivalent level of care is available:: No  Covid Evaluation: Asymptomatic - no recent exposure (last 10 days) testing not required  Diagnosis: Wound infection [308657]  Admitting Physician: Therisa Doyne [[id number]]  Attending Physician: Therisa Doyne [[id number]]           Obs    Level of care     tele  For 12H      Ellieana Dolecki 08/02/2023, 11:20 PM    [facility]     after 2 AM please page floor coverage PA If 7AM-7PM, please contact the day team taking care of the patient using [email or web address]

## 2023-08-02 NOTE — ED Provider Notes (Signed)
[place] [facility]ENC[facility] DEPARTMENT AT [facility] Provider Note   CSN: [id number] Arrival date & time: 08/02/23  1345     History  Chief Complaint  Patient presents with   Wound Infection    Amy Moses is a 52 y.o. female who presents emergency department chief complaint foot wound.  She has a past medical history of chronic nonhealing foot ulcer, calciphylaxis, end-stage renal disease and diabetes.  Patient goes to dialysis Tuesday Thursday Saturday and last had full dialysis session yesterday.  Patient has a home health care nurse who is doing 4-layer compression of her legs.  This morning patient's wound care nurse came out and was concerned because she found that her foot wound was draining more than normal and her foot appeared more red and had an abnormal smell.  Patient denies fever or chills.  She does not have good sensation due to history of diabetic neuropathy.  HPI     Home Medications Prior to Admission medications   Medication Sig Start Date End Date Taking? Authorizing Provider  acetaminophen (TYLENOL) 500 MG tablet Take 1,000 mg by mouth every 6 (six) hours as needed for moderate pain.    [provider]  atorvastatin (LIPITOR) 10 MG tablet Take 10 mg by mouth daily.    [provider]  AURYXIA 1 GM 210 MG(Fe) tablet Take 630 mg by mouth 3 (three) times daily with meals. 01/07/22   [provider]  Blood Glucose Monitoring Suppl (ACCU-CHEK GUIDE ME) w/Device KIT 1 Piece by Does not apply route as directed. 03/16/22   Roma Kayser, MD  Blood Pressure Monitor KIT TAKE BLOOD PRESSURE DAILY 09/15/22   Chilton Si, MD  Continuous Blood Gluc Receiver (FREESTYLE LIBRE 2 READER) DEVI As directed 04/06/22   Roma Kayser, MD  Continuous Blood Gluc Sensor (FREESTYLE LIBRE 2 SENSOR) MISC 1 Piece by Does not apply route every 14 (fourteen) days. 04/06/22   Roma Kayser, MD  glucose blood (ACCU-CHEK GUIDE) test strip  Use as instructed 03/16/22   Roma Kayser, MD  HUMALOG KWIKPEN 100 UNIT/ML KwikPen Inject 5-11 Units into the skin 3 (three) times daily with meals. If eats 50% or more of meal. Patient taking differently: Inject 5-11 Units into the skin 3 (three) times daily with meals. If eats 50% or more of meal. Sliding scale 03/16/22   Nida, Denman George, MD  insulin glargine (LANTUS) 100 UNIT/ML injection Inject 0.2 mLs (20 Units total) into the skin at bedtime. Patient taking differently: Inject 8 Units into the skin at bedtime. 04/06/22   Roma Kayser, MD  levothyroxine (SYNTHROID) 75 MCG tablet Take 75 mcg by mouth daily. 06/13/22   [provider]  midodrine (PROAMATINE) 10 MG tablet Take 10 mg by mouth daily. 09/27/22   [provider]  pantoprazole (PROTONIX) 40 MG tablet Take 1 tablet (40 mg total) by mouth 2 (two) times daily. 04/25/21   Shon Hale, MD  polyethylene glycol powder (MIRALAX) 17 GM/SCOOP powder Take 17 g by mouth daily. 03/17/23   Narda Bonds, MD  Vitamin D, Ergocalciferol, (DRISDOL) 1.25 MG (50000 UNIT) CAPS capsule Take 50,000 Units by mouth every 7 (seven) days. Friday 04/20/21   [provider]  Wheat Dextrin (BENEFIBER) POWD Stir 2 teaspoons of Benefiber Original into 4-8 oz. of beverage or soft food (hot or cold), three times daily. Stir well until dissolved (up to 60 seconds.) Patient not taking: Reported on 07/20/2023 03/17/23   Narda Bonds,  MD      Allergies    Ace inhibitors    Review of Systems   Review of Systems  Physical Exam Updated Vital Signs BP 107/67   Pulse 77   Temp 98.6 F (37 C) (Oral)   Resp 18   Ht 5\' 6"  (1.676 m)   Wt 97.5 kg   LMP 08/22/2015 (Approximate)   SpO2 100%   BMI 34.70 kg/m  Physical Exam Vitals and nursing note reviewed.  Constitutional:      General: She is not in acute distress.    Appearance: She is well-developed. She is not diaphoretic.  HENT:     Head: Normocephalic and  atraumatic.     Right Ear: External ear normal.     Left Ear: External ear normal.     Nose: Nose normal.     Mouth/Throat:     Mouth: Mucous membranes are moist.  Eyes:     General: No scleral icterus.    Conjunctiva/sclera: Conjunctivae normal.  Cardiovascular:     Rate and Rhythm: Normal rate and regular rhythm.     Heart sounds: Normal heart sounds. No murmur heard.    No friction rub. No gallop.  Pulmonary:     Effort: Pulmonary effort is normal. No respiratory distress.     Breath sounds: Normal breath sounds.  Abdominal:     General: Bowel sounds are normal. There is no distension.     Palpations: Abdomen is soft. There is no mass.     Tenderness: There is no abdominal tenderness. There is no guarding.  Musculoskeletal:     Cervical back: Normal range of motion.     Comments: Thick layers of dry flaky skin over the feet, dystrophic nails.  There is a wound on the bottom of the right foot, there is a bit of a Puro-sanguinous discharge.  Darkening of the midfoot.  Skin:    General: Skin is warm and dry.  Neurological:     Mental Status: She is alert and oriented to person, place, and time.  Psychiatric:        Behavior: Behavior normal.        ED Results / Procedures / Treatments   Labs (all labs ordered are listed, but only abnormal results are displayed) Labs Reviewed  COMPREHENSIVE METABOLIC PANEL - Abnormal; Notable for the following components:      Result Value   Sodium 134 (*)    Glucose, Bld 157 (*)    BUN 37 (*)    Creatinine, Ser 6.21 (*)    Calcium 7.8 (*)    AST 13 (*)    Alkaline Phosphatase 131 (*)    GFR, Estimated 8 (*)    All other components within normal limits  CBC WITH DIFFERENTIAL/PLATELET - Abnormal; Notable for the following components:   RBC 3.84 (*)    Hemoglobin 11.4 (*)    Platelets 86 (*)    All other components within normal limits  CULTURE, BLOOD (ROUTINE X 2)  CULTURE, BLOOD (ROUTINE X 2)  I-STAT CG4 LACTIC ACID, ED  CBG  MONITORING, ED  I-STAT CG4 LACTIC ACID, ED    EKG None  Radiology DG Foot Complete Right  Result Date: 08/02/2023 CLINICAL DATA:  Right foot wound EXAM: RIGHT FOOT COMPLETE - 3 VIEW COMPARISON:  X-ray 05/10/2023. Images only. There is no report available. FINDINGS: Fixation hardware seen about the distal tibia and fibula at the edge of the imaging field. There is severe osteopenia. Global soft tissue  swelling about the foot with some plantar soft tissue gas at the level of the midfoot on the lateral view. Severe osteopenia with multifocal areas of bony deformity consistent with old trauma there is also hallux valgus deformity of the first ray with hypertrophic degenerative changes. 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 No definite erosive changes identified at this time. Vascular calcifications are seen. Plantar calcaneal spur. If there is further concern of bone infection, follow up MRI or bone scan may be useful for further sensitivity. IMPRESSION: Extensive soft tissue swelling about the foot with some new plantar soft tissue gas. Please correlate with clinical findings. Previous surgical changes from amputation of fifth ray, fixation hardware about the ankle. Severe osteopenia with degenerative changes and posttraumatic changes. Electronically Signed   By: Karen Kays M.D.   On: 08/02/2023 15:54    Procedures Procedures    Medications Ordered in ED Medications - No data to display  ED Course/ Medical Decision Making/ A&P                                 Medical Decision Making 52 year old female with a past medical history of diabetes, ESRD, calciphylaxis, chronic foot wound who presents upon recommendation of her home health nurse for evaluation of possible infection of a chronic ulcer of her right foot.  SDOH Screenings Food Insecurity: Food Insecurity Present (08/03/2023) Housing: Low Risk  (08/03/2023) Transportation Needs: Unmet  Transportation Needs (08/03/2023) Utilities: Not At Risk (08/03/2023) Depression (PHQ2-9): Low Risk  (04/06/2022) Tobacco Use: Low Risk  (08/03/2023)   I ordered labs including CMP which shows glucose of 157 in setting of diabetes, creatinine 6.21, normal potassium level, mild hypocalcemia all of insignificant value. CBC shows mild normocytic anemia secondary to chronic disease and platelet count of 86 which is downtrending but chronic  I visualized and interpreted a foot x-ray showing subcutaneous gas likely secondary to the ulcer on her foot.  I doubt necrotizing infection.  I discussed the case with Dr. Adela Glimpse.  Given patient's social barriers, and medical risk factors plus her personal discomfort would being discharged given the increased in swelling and discharge from the foot will admit for IV antibiotics.  Amount and/or Complexity of Data Reviewed Labs: ordered. Radiology: ordered and independent interpretation performed. ECG/medicine tests: ordered and independent interpretation performed.    Details: ED ECG REPORT   Date: 08/03/2023  Rate: 67  Rhythm: normal sinus rhythm  QRS Axis: normal  Intervals: normal  ST/T Wave abnormalities: normal  Conduction Disutrbances:none  Narrative Interpretation:    I have personally reviewed the EKG tracing and agree with the computerized printout as noted.   Risk Decision regarding hospitalization.           Final Clinical Impression(s) / ED Diagnoses Final diagnoses:  Diabetic foot infection (HCC)  Thrombocytopenia [place])    Rx / DC Orders ED Discharge Orders     None         Arthor Captain, PA-C 08/03/23 1131    Coral Spikes, [place] 08/05/23 [phone]

## 2023-08-02 NOTE — Assessment & Plan Note (Signed)
Chronic cont lipitor 10 mg po qday

## 2023-08-02 NOTE — Assessment & Plan Note (Signed)
Chronic stable add on anemia panel

## 2023-08-02 NOTE — ED Triage Notes (Signed)
Pt arrives via POV. Pt reports wound to the bottom of her foot for the past 3 months with no improvement. Pt is a diabetic. Denies fever. Foot is currently bandaged.

## 2023-08-02 NOTE — Assessment & Plan Note (Signed)
-   Wound care consult 

## 2023-08-02 NOTE — Subjective & Objective (Signed)
Presents with a wound on the bottom of her foot for past 3 months which does not seem to be getting better she is diabetic no associated fevers She has history of nonhealing foot ulcers in the past as well as calciphylaxis And end-stage diabetes last hemodialysis was yesterday she has home health nurse who has been doing compression dressings to her legs but this morning the wound care nurse seen her and was concerned because it was more draining and her foot appears to be red no fevers or chills patient is diabetic neuropathy

## 2023-08-02 NOTE — Assessment & Plan Note (Addendum)
-   Order Sensitive SSI   - continue home insulin but decreased to  5 units,  -  check TSH and HgA1C

## 2023-08-02 NOTE — Assessment & Plan Note (Signed)
Now need midodrine support

## 2023-08-02 NOTE — Assessment & Plan Note (Signed)
- 

## 2023-08-02 NOTE — Assessment & Plan Note (Signed)
Currently appears euvolemic continue to monitor

## 2023-08-03 ENCOUNTER — Encounter (HOSPITAL_COMMUNITY): Payer: Self-pay | Admitting: Internal Medicine

## 2023-08-03 ENCOUNTER — Observation Stay (HOSPITAL_COMMUNITY): Payer: Medicare Other

## 2023-08-03 DIAGNOSIS — M86171 Other acute osteomyelitis, right ankle and foot: Secondary | ICD-10-CM | POA: Diagnosis present

## 2023-08-03 DIAGNOSIS — I739 Peripheral vascular disease, unspecified: Secondary | ICD-10-CM | POA: Diagnosis not present

## 2023-08-03 DIAGNOSIS — I5032 Chronic diastolic (congestive) heart failure: Secondary | ICD-10-CM | POA: Diagnosis present

## 2023-08-03 DIAGNOSIS — N185 Chronic kidney disease, stage 5: Secondary | ICD-10-CM | POA: Diagnosis not present

## 2023-08-03 DIAGNOSIS — L89156 Pressure-induced deep tissue damage of sacral region: Secondary | ICD-10-CM | POA: Diagnosis present

## 2023-08-03 DIAGNOSIS — E1169 Type 2 diabetes mellitus with other specified complication: Secondary | ICD-10-CM | POA: Diagnosis present

## 2023-08-03 DIAGNOSIS — E1122 Type 2 diabetes mellitus with diabetic chronic kidney disease: Secondary | ICD-10-CM | POA: Diagnosis present

## 2023-08-03 DIAGNOSIS — I959 Hypotension, unspecified: Secondary | ICD-10-CM | POA: Diagnosis present

## 2023-08-03 DIAGNOSIS — N186 End stage renal disease: Secondary | ICD-10-CM | POA: Diagnosis present

## 2023-08-03 DIAGNOSIS — L97519 Non-pressure chronic ulcer of other part of right foot with unspecified severity: Secondary | ICD-10-CM | POA: Diagnosis not present

## 2023-08-03 DIAGNOSIS — E11621 Type 2 diabetes mellitus with foot ulcer: Secondary | ICD-10-CM | POA: Diagnosis present

## 2023-08-03 DIAGNOSIS — Z794 Long term (current) use of insulin: Secondary | ICD-10-CM | POA: Diagnosis not present

## 2023-08-03 DIAGNOSIS — D631 Anemia in chronic kidney disease: Secondary | ICD-10-CM | POA: Diagnosis present

## 2023-08-03 DIAGNOSIS — L03115 Cellulitis of right lower limb: Secondary | ICD-10-CM | POA: Diagnosis present

## 2023-08-03 DIAGNOSIS — D696 Thrombocytopenia, unspecified: Secondary | ICD-10-CM | POA: Diagnosis present

## 2023-08-03 DIAGNOSIS — I70234 Atherosclerosis of native arteries of right leg with ulceration of heel and midfoot: Secondary | ICD-10-CM | POA: Diagnosis present

## 2023-08-03 DIAGNOSIS — I272 Pulmonary hypertension, unspecified: Secondary | ICD-10-CM | POA: Diagnosis present

## 2023-08-03 DIAGNOSIS — E1151 Type 2 diabetes mellitus with diabetic peripheral angiopathy without gangrene: Secondary | ICD-10-CM | POA: Diagnosis present

## 2023-08-03 DIAGNOSIS — E1140 Type 2 diabetes mellitus with diabetic neuropathy, unspecified: Secondary | ICD-10-CM | POA: Diagnosis present

## 2023-08-03 DIAGNOSIS — I132 Hypertensive heart and chronic kidney disease with heart failure and with stage 5 chronic kidney disease, or end stage renal disease: Secondary | ICD-10-CM | POA: Diagnosis present

## 2023-08-03 DIAGNOSIS — E669 Obesity, unspecified: Secondary | ICD-10-CM | POA: Diagnosis present

## 2023-08-03 DIAGNOSIS — E11628 Type 2 diabetes mellitus with other skin complications: Secondary | ICD-10-CM | POA: Diagnosis present

## 2023-08-03 DIAGNOSIS — L98499 Non-pressure chronic ulcer of skin of other sites with unspecified severity: Secondary | ICD-10-CM | POA: Diagnosis not present

## 2023-08-03 DIAGNOSIS — Z992 Dependence on renal dialysis: Secondary | ICD-10-CM | POA: Diagnosis not present

## 2023-08-03 DIAGNOSIS — N2581 Secondary hyperparathyroidism of renal origin: Secondary | ICD-10-CM | POA: Diagnosis present

## 2023-08-03 DIAGNOSIS — E039 Hypothyroidism, unspecified: Secondary | ICD-10-CM | POA: Diagnosis present

## 2023-08-03 DIAGNOSIS — L97511 Non-pressure chronic ulcer of other part of right foot limited to breakdown of skin: Secondary | ICD-10-CM | POA: Diagnosis present

## 2023-08-03 LAB — PREALBUMIN
Prealbumin: 22 mg/dL (ref 18–38)
Prealbumin: 23 mg/dL (ref 18–38)

## 2023-08-03 LAB — CBC
HCT: 32.7 % — ABNORMAL LOW (ref 36.0–46.0)
Hemoglobin: 9.9 g/dL — ABNORMAL LOW (ref 12.0–15.0)
MCH: 28.2 pg (ref 26.0–34.0)
MCHC: 30.3 g/dL (ref 30.0–36.0)
MCV: 93.2 fL (ref 80.0–100.0)
Platelets: 75 10*3/uL — ABNORMAL LOW (ref 150–400)
RBC: 3.51 MIL/uL — ABNORMAL LOW (ref 3.87–5.11)
RDW: 15.4 % (ref 11.5–15.5)
WBC: 6.4 10*3/uL (ref 4.0–10.5)
nRBC: 0 % (ref 0.0–0.2)

## 2023-08-03 LAB — COMPREHENSIVE METABOLIC PANEL
ALT: 13 U/L (ref 0–44)
AST: 11 U/L — ABNORMAL LOW (ref 15–41)
Albumin: 3.3 g/dL — ABNORMAL LOW (ref 3.5–5.0)
Alkaline Phosphatase: 117 U/L (ref 38–126)
Anion gap: 14 (ref 5–15)
BUN: 43 mg/dL — ABNORMAL HIGH (ref 6–20)
CO2: 21 mmol/L — ABNORMAL LOW (ref 22–32)
Calcium: 7.8 mg/dL — ABNORMAL LOW (ref 8.9–10.3)
Chloride: 100 mmol/L (ref 98–111)
Creatinine, Ser: 6.92 mg/dL — ABNORMAL HIGH (ref 0.44–1.00)
GFR, Estimated: 7 mL/min — ABNORMAL LOW (ref >60)
Glucose, Bld: 223 mg/dL — ABNORMAL HIGH (ref 70–99)
Potassium: 3.8 mmol/L (ref 3.5–5.1)
Sodium: 135 mmol/L (ref 135–145)
Total Bilirubin: 0.6 mg/dL (ref 0.3–1.2)
Total Protein: 6.1 g/dL — ABNORMAL LOW (ref 6.5–8.1)

## 2023-08-03 LAB — C-REACTIVE PROTEIN: CRP: 1.7 mg/dL — ABNORMAL HIGH (ref <1.0)

## 2023-08-03 LAB — GLUCOSE, CAPILLARY
Glucose-Capillary: 201 mg/dL — ABNORMAL HIGH (ref 70–99)
Glucose-Capillary: 215 mg/dL — ABNORMAL HIGH (ref 70–99)
Glucose-Capillary: 163 mg/dL — ABNORMAL HIGH (ref 70–99)
Glucose-Capillary: 139 mg/dL — ABNORMAL HIGH (ref 70–99)
Glucose-Capillary: 146 mg/dL — ABNORMAL HIGH (ref 70–99)

## 2023-08-03 LAB — IRON AND TIBC
Iron: 52 ug/dL (ref 28–170)
Saturation Ratios: 23 % (ref 10.4–31.8)
TIBC: 230 ug/dL — ABNORMAL LOW (ref 250–450)
UIBC: 178 ug/dL

## 2023-08-03 LAB — MAGNESIUM: Magnesium: 2.2 mg/dL (ref 1.7–2.4)

## 2023-08-03 LAB — HEPATIC FUNCTION PANEL
ALT: 15 U/L (ref 0–44)
AST: 14 U/L — ABNORMAL LOW (ref 15–41)
Albumin: 4 g/dL (ref 3.5–5.0)
Alkaline Phosphatase: 134 U/L — ABNORMAL HIGH (ref 38–126)
Bilirubin, Direct: 0.3 mg/dL — ABNORMAL HIGH (ref 0.0–0.2)
Indirect Bilirubin: 0.7 mg/dL (ref 0.3–0.9)
Total Bilirubin: 1 mg/dL (ref 0.3–1.2)
Total Protein: 7.1 g/dL (ref 6.5–8.1)

## 2023-08-03 LAB — PROCALCITONIN: Procalcitonin: 1.58 ng/mL

## 2023-08-03 LAB — HEPATITIS B SURFACE ANTIGEN: Hepatitis B Surface Ag: NONREACTIVE

## 2023-08-03 LAB — RETICULOCYTES
Immature Retic Fract: 10.4 % (ref 2.3–15.9)
RBC.: 3.91 MIL/uL (ref 3.87–5.11)
Retic Count, Absolute: 66.9 10*3/uL (ref 19.0–186.0)
Retic Ct Pct: 1.7 % (ref 0.4–3.1)

## 2023-08-03 LAB — PHOSPHORUS: Phosphorus: 6.7 mg/dL — ABNORMAL HIGH (ref 2.5–4.6)

## 2023-08-03 LAB — VAS US ABI WITH/WO TBI

## 2023-08-03 LAB — VITAMIN B12: Vitamin B-12: 297 pg/mL (ref 180–914)

## 2023-08-03 LAB — FOLATE: Folate: 8.9 ng/mL (ref >5.9)

## 2023-08-03 LAB — FERRITIN: Ferritin: 1099 ng/mL — ABNORMAL HIGH (ref 11–307)

## 2023-08-03 LAB — TSH: TSH: 4.315 u[IU]/mL (ref 0.350–4.500)

## 2023-08-03 LAB — MRSA NEXT GEN BY PCR, NASAL: MRSA by PCR Next Gen: NOT DETECTED

## 2023-08-03 LAB — SEDIMENTATION RATE: Sed Rate: 7 mm/h (ref 0–22)

## 2023-08-03 MED ORDER — LIDOCAINE HCL (PF) 1 % IJ SOLN: 5.0000 mL | INTRAMUSCULAR | Status: DC | PRN

## 2023-08-03 MED ORDER — HYDROCODONE-ACETAMINOPHEN 5-325 MG PO TABS
1.0000 | ORAL_TABLET | ORAL | Status: DC | PRN
  Administered 2023-08-04: 2 via ORAL
  Filled 2023-08-03: qty 2

## 2023-08-03 MED ORDER — RENA-VITE PO TABS
1.0000 | ORAL_TABLET | Freq: Every day | ORAL | Status: DC
  Administered 2023-08-03 – 2023-08-07 (×5): 1 via ORAL
  Filled 2023-08-03 (×5): qty 1

## 2023-08-03 MED ORDER — JUVEN PO PACK
1.0000 | PACK | Freq: Two times a day (BID) | ORAL | Status: DC
  Administered 2023-08-03 – 2023-08-07 (×5): 1 via ORAL
  Filled 2023-08-03 (×6): qty 1

## 2023-08-03 MED ORDER — PANTOPRAZOLE SODIUM 40 MG PO TBEC
40.0000 mg | DELAYED_RELEASE_TABLET | Freq: Two times a day (BID) | ORAL | Status: DC
  Administered 2023-08-03 – 2023-08-07 (×11): 40 mg via ORAL
  Filled 2023-08-03 (×11): qty 1

## 2023-08-03 MED ORDER — ALTEPLASE 2 MG IJ SOLR: 2.0000 mg | Freq: Once | INTRAMUSCULAR | Status: DC | PRN

## 2023-08-03 MED ORDER — HEPARIN SODIUM (PORCINE) 1000 UNIT/ML DIALYSIS: 1000.0000 [IU] | INTRAMUSCULAR | Status: DC | PRN

## 2023-08-03 MED ORDER — SODIUM CHLORIDE 0.9% FLUSH
3.0000 mL | Freq: Two times a day (BID) | INTRAVENOUS | Status: DC
  Administered 2023-08-03 – 2023-08-07 (×9): 3 mL via INTRAVENOUS

## 2023-08-03 MED ORDER — ANTICOAGULANT SODIUM CITRATE 4% (200MG/5ML) IV SOLN: 5.0000 mL | Status: DC | PRN

## 2023-08-03 MED ORDER — LIDOCAINE-PRILOCAINE 2.5-2.5 % EX CREA: 1.0000 | TOPICAL_CREAM | CUTANEOUS | Status: DC | PRN

## 2023-08-03 MED ORDER — LEVOTHYROXINE SODIUM 75 MCG PO TABS
75.0000 ug | ORAL_TABLET | Freq: Every day | ORAL | Status: DC
  Administered 2023-08-04 – 2023-08-08 (×5): 75 ug via ORAL
  Filled 2023-08-03 (×5): qty 1

## 2023-08-03 MED ORDER — MIDODRINE HCL 5 MG PO TABS
10.0000 mg | ORAL_TABLET | Freq: Every day | ORAL | Status: DC
  Administered 2023-08-03 – 2023-08-08 (×6): 10 mg via ORAL
  Filled 2023-08-03 (×6): qty 2

## 2023-08-03 MED ORDER — SODIUM CHLORIDE 0.9% FLUSH: 3.0000 mL | INTRAVENOUS | Status: DC | PRN

## 2023-08-03 MED ORDER — CHLORHEXIDINE GLUCONATE CLOTH 2 % EX PADS
6.0000 | MEDICATED_PAD | Freq: Every day | CUTANEOUS | Status: DC
  Administered 2023-08-03 – 2023-08-05 (×3): 6 via TOPICAL

## 2023-08-03 MED ORDER — ATORVASTATIN CALCIUM 10 MG PO TABS
10.0000 mg | ORAL_TABLET | Freq: Every evening | ORAL | Status: DC
  Administered 2023-08-03 – 2023-08-07 (×5): 10 mg via ORAL
  Filled 2023-08-03 (×5): qty 1

## 2023-08-03 MED ORDER — ACETAMINOPHEN 650 MG RE SUPP: 650.0000 mg | Freq: Four times a day (QID) | RECTAL | Status: DC | PRN

## 2023-08-03 MED ORDER — PENTAFLUOROPROP-TETRAFLUOROETH EX AERO: 1.0000 | INHALATION_SPRAY | CUTANEOUS | Status: DC | PRN

## 2023-08-03 MED ORDER — NEPRO/CARBSTEADY PO LIQD
237.0000 mL | Freq: Two times a day (BID) | ORAL | Status: DC
  Administered 2023-08-03 – 2023-08-06 (×2): 237 mL via ORAL

## 2023-08-03 MED ORDER — ACETAMINOPHEN 325 MG PO TABS: 650.0000 mg | ORAL_TABLET | Freq: Four times a day (QID) | ORAL | Status: DC | PRN

## 2023-08-03 MED ORDER — ONDANSETRON HCL 4 MG/2ML IJ SOLN: 4.0000 mg | Freq: Four times a day (QID) | INTRAMUSCULAR | Status: DC | PRN

## 2023-08-03 MED ORDER — INSULIN GLARGINE-YFGN 100 UNIT/ML ~~LOC~~ SOLN
5.0000 [IU] | Freq: Every day | SUBCUTANEOUS | Status: DC
  Administered 2023-08-03 – 2023-08-05 (×4): 5 [IU] via SUBCUTANEOUS
  Filled 2023-08-03 (×6): qty 0.05

## 2023-08-03 MED ORDER — FERRIC CITRATE 1 GM 210 MG(FE) PO TABS
630.0000 mg | ORAL_TABLET | Freq: Three times a day (TID) | ORAL | Status: DC
  Administered 2023-08-03 – 2023-08-08 (×11): 630 mg via ORAL
  Filled 2023-08-03 (×19): qty 3

## 2023-08-03 MED ORDER — SODIUM CHLORIDE 0.9 % IV SOLN: 250.0000 mL | INTRAVENOUS | Status: DC | PRN

## 2023-08-03 MED ORDER — ONDANSETRON HCL 4 MG PO TABS: 4.0000 mg | ORAL_TABLET | Freq: Four times a day (QID) | ORAL | Status: DC | PRN

## 2023-08-03 NOTE — Assessment & Plan Note (Signed)
Stable. On lantus and SSI.

## 2023-08-03 NOTE — Consult Note (Signed)
WOC Nurse Consult Note: Reason for Consult: history of sacral breakdown Patient admitted for wound on the bottom of her foot. Hx ESRD, DM2, calciphylaxis Followed outpatient by Dr.Duda for tx of LEs, lymphedema. Using pumps and compression wraps that are changed weekly.  Now with extensive swelling of the right foot and plantar soft tissue gas.  Wound type: Full thickness ulceration; right foot; in the presence of DM and lymphedema  Scattered skin changes of the right LE; evidence of previous wounds; calciphylaxis  Scattered areas over the buttocks, partial thickness per nursing  Pressure Injury POA: NA Measurement: see nursing flow sheets Wound bed: Areas over the right pretibial; malleolar region are dark/deeper skin changes consistent with calciphylaxis dx.  Hypertrophic skin changes to the dorsal right foot  Unable to visualize wound bed on the posterior heel/achilles area  Drainage (amount, consistency, odor) yellow  Periwound: edema  Dressing procedure/placement/frequency: No topical care for LEs at this time. Dr. Lajoyce Corners to evaluate patient. Requested LE wrap to be removed from the LLE for his assessment.  Silicone foam for the buttocks.   Re consult if needed, will not follow at this time. Thanks  Ladashia Demarinis [employer], RN,CWOCN, CNS, CWON-AP [phone])

## 2023-08-03 NOTE — Progress Notes (Signed)
C Pap owned by patient

## 2023-08-03 NOTE — Assessment & Plan Note (Signed)
Continue with home CPAP.

## 2023-08-03 NOTE — Assessment & Plan Note (Addendum)
Admitted for draining right foot wound. On IV rocephin/po flagyl. Ortho consulted. ABI are abnormal due to incompressible arteries. Vascular surgery consulted. MRI right foot shows cuboid osteo. Further MRI right hindfoot completed as recommended by radiologist. Not yet read.

## 2023-08-03 NOTE — Assessment & Plan Note (Signed)
Stable

## 2023-08-03 NOTE — Assessment & Plan Note (Signed)
Stable. On lipitor 10 mg daily.

## 2023-08-03 NOTE — Consult Note (Signed)
[facility] Renal Consultation Note    Indication for Consultation:  Management of ESRD/hemodialysis, anemia, hypertension/volume, and secondary hyperparathyroidism.  HPI: Amy Moses is a 52 y.o. female with PMH including ESRD on dialysis, pulmonary HTN, T2DM, HTN, who presented to the ED with right foot wound. Patient reports wound has been present for 3 months but was not improving. Reports she has a history of foot ulcers and calciphylaxis. Does not appear she is on sodium thiosulfate. She reports she has not had any new calciphylaxis lesions in over a year. Reports she has chronic edema and uses compression wraps.  Foot x-ray was consistent with cellulitis, she was started on empiric antibiotics and ortho consulted. Labs notable for K+ 3.8, BUN 42, Cr 6.92, Phos 6.7, Ca 7.8, Alb 3.3, WBC 6.4, Hgb 9.9. BP soft/stable, afebrile, vital signs otherwise stable.   Nephrology was consulted for dialysis. She is on a MWF TTS schedule, last HD Tuesday. Reports she has been coming in under her EDW and usually has only about 1L UF. She reports she takes midodrine 10mg  pre-HD. Denies SOB, CP, dizziness, abdominal pain, N/V/D.   Past Medical History:  Diagnosis Date   Anemia    Blindness of right eye with low vision in contralateral eye    s/p victrectomy   Chronic diastolic heart failure (HCC) 03/30/2021   Diabetes mellitus, type II (HCC)    Dyslipidemia    Glaucoma    History of blood transfusion    Hypertension    Hypothyroidism (acquired)    Kidney disease    Stage 5   Mitral regurgitation 11/16/2022   Pneumonia    Pulmonary hypertension, unspecified (HCC) 09/12/2022   Past Surgical History:  Procedure Laterality Date   ABDOMINAL AORTOGRAM W/LOWER EXTREMITY Bilateral 12/18/2020   Procedure: ABDOMINAL AORTOGRAM W/LOWER EXTREMITY;  Surgeon: Sherren Kerns, MD;  Location: [facility];  Service: Cardiovascular;  Laterality: Bilateral;   ABDOMINAL AORTOGRAM W/LOWER  EXTREMITY Bilateral 01/25/2022   Procedure: ABDOMINAL AORTOGRAM W/LOWER EXTREMITY;  Surgeon: Nada Libman, MD;  Location: [facility];  Service: Cardiovascular;  Laterality: Bilateral;   AMPUTATION Right 02/16/2022   Procedure: RIGHT FOOT 5TH RAY AMPUTATION;  Surgeon: Nadara Mustard, MD;  Location: [facility] OR;  Service: Orthopedics;  Laterality: Right;   ANKLE FRACTURE SURGERY Right    AV FISTULA PLACEMENT Left 08/18/2020   Procedure: LEFT ARM BRACHIOCEPHALIC ARTERIOVENOUS (AV) FISTULA CREATION;  Surgeon: Sherren Kerns, MD;  Location: [facility] OR;  Service: Vascular;  Laterality: Left;   BIOPSY  04/24/2021   Procedure: BIOPSY;  Surgeon: Lanelle Bal, DO;  Location: [facility];  Service: Endoscopy;;   CESAREAN SECTION     CHOLECYSTECTOMY     COLONOSCOPY  04/24/2021   Surgeon: Lanelle Bal, DO;  nonbleeding internal hemorrhoids, 1 large (25 mm) pedunculated transverse colon polyp (prolapse type polyp) with adherent clot and stigmata of recent bleed.   COLONOSCOPY WITH PROPOFOL N/A 05/14/2021   Procedure: COLONOSCOPY WITH PROPOFOL;  Surgeon: Corbin Ade, MD;  Location: [facility];  Service: Endoscopy;  Laterality: N/A;   COLONOSCOPY WITH PROPOFOL N/A 03/17/2023   Procedure: COLONOSCOPY WITH PROPOFOL;  Surgeon: Corbin Ade, MD;  Location: [facility];  Service: Endoscopy;  Laterality: N/A;   ESOPHAGOGASTRODUODENOSCOPY (EGD) WITH PROPOFOL N/A 04/24/2021   Surgeon: Lanelle Bal, DO;  duodenal erosions and gastritis biopsied (pathology with peptic duodenitis, reactive gastropathy with erosions/chronic inflammation, negative for H. pylori)   EYE SURGERY  Vatrectomy   HEMOSTASIS CLIP PLACEMENT  05/14/2021   Procedure: HEMOSTASIS CLIP PLACEMENT;  Surgeon: Corbin Ade, MD;  Location: [facility];  Service: Endoscopy;;   IR PERC TUN PERIT CATH WO PORT S&I /IMAG  09/15/2020   IR REMOVAL TUN CV CATH W/O FL  02/19/2021   IR US GUIDE VASC ACCESS RIGHT  09/15/2020    POLYPECTOMY  04/24/2021   Procedure: POLYPECTOMY;  Surgeon: Lanelle Bal, DO;  [facility]: [facility];  Service: Endoscopy;;   POLYPECTOMY  05/14/2021   Procedure: POLYPECTOMY;  Surgeon: Corbin Ade, MD;  [facility]: [facility];  Service: Endoscopy;;   POLYPECTOMY  03/17/2023   Procedure: POLYPECTOMY;  Surgeon: Corbin Ade, MD;  [facility]: [facility];  Service: Endoscopy;;   SKIN SPLIT GRAFT Bilateral 09/03/2021   Procedure: SKIN GRAFT BILATERAL LEGS;  Surgeon: Nadara Mustard, MD;  Location: [facility] OR;  Service: Orthopedics;  Laterality: Bilateral;   SKIN SPLIT GRAFT Left 12/10/2021   Procedure: IRRIGATION AND DEBRIDEMENT LEFT CALF, APPLICATION SPLIT THICKNESS SKIN GRAFT;  Surgeon: Nadara Mustard, MD;  Location: [facility] OR;  Service: Orthopedics;  Laterality: Left;   TOE SURGERY     Family History  Problem Relation Age of Onset   Heart failure Mother    Heart disease Mother    Diabetes Mother    Kidney disease Mother    Heart failure Father    Diabetes Father    Heart disease Father    Diabetes Brother    Heart failure Maternal Grandmother    Heart failure Maternal Grandfather    Transient ischemic attack Maternal Grandfather    Colon cancer Neg Hx    Social History:  reports that she has never smoked. She has never used smokeless tobacco. She reports that she does not drink alcohol and does not use drugs.  ROS: As per HPI otherwise negative.  Physical Exam: Vitals:   08/02/23 2303 08/02/23 2322 08/03/23 0211 08/03/23 0754  BP: 103/68 (!) 101/59 93/60 95/64   Pulse: 72 74 71 68  Resp: 18 16 18    Temp:  98.5 F (36.9 C) 97.7 F (36.5 C) (!) 97.5 F (36.4 C)  TempSrc:  Oral Oral Oral  SpO2: 100% 100% 97% 99%  Weight:      Height:         General: Alert female in NAD Head: Normocephalic, atraumatic, sclera non-icteric, mucus membranes are moist. Lungs: Clear bilaterally to auscultation without wheezes, rales, or rhonchi. Breathing is unlabored. Heart: RRR with  normal S1, S2. No murmurs, rubs, or gallops appreciated. Abdomen: Soft, non-tender, non-distended with normoactive bowel sounds.  Musculoskeletal:  Strength and tone appear normal for age. Lower extremities: tense edema b/l upper legs, right foot wound Neuro: Alert and oriented X 3. Moves all extremities spontaneously. Psych:  Responds to questions appropriately with a normal affect. Dialysis Access: LUE AVF +t/b  Allergies  Allergen Reactions   Ace Inhibitors Cough   Prior to Admission medications   Medication Sig Start Date End Date Taking? Authorizing Provider  acetaminophen (TYLENOL) 500 MG tablet Take 1,000 mg by mouth every 6 (six) hours as needed for moderate pain.   Yes [provider]  atorvastatin (LIPITOR) 10 MG tablet Take 10 mg by mouth every evening.   Yes [provider]  AURYXIA 1 GM 210 MG(Fe) tablet Take 630 mg by mouth 3 (three) times daily with meals. 01/07/22  Yes [provider]  HUMALOG KWIKPEN 100 UNIT/ML KwikPen Inject 5-11 Units  into the skin 3 (three) times daily with meals. If eats 50% or more of meal. Patient taking differently: Inject 5-11 Units into the skin 3 (three) times daily with meals. If eats 50% or more of meal. Sliding scale 03/16/22  Yes Nida, Denman George, MD  insulin glargine (LANTUS) 100 UNIT/ML injection Inject 0.2 mLs (20 Units total) into the skin at bedtime. Patient taking differently: Inject 8 Units into the skin at bedtime. 04/06/22  Yes Nida, Denman George, MD  levothyroxine (SYNTHROID) 75 MCG tablet Take 75 mcg by mouth daily. 06/13/22  Yes [provider]  midodrine (PROAMATINE) 10 MG tablet Take 10 mg by mouth daily. 09/27/22  Yes [provider]  pantoprazole (PROTONIX) 40 MG tablet Take 1 tablet (40 mg total) by mouth 2 (two) times daily. 04/25/21  Yes Emokpae, Courage, MD  polyethylene glycol powder (MIRALAX) 17 GM/SCOOP powder Take 17 g by mouth daily. 03/17/23  Yes Narda Bonds, MD   Vitamin D, Ergocalciferol, (DRISDOL) 1.25 MG (50000 UNIT) CAPS capsule Take 50,000 Units by mouth every 7 (seven) days. Friday 04/20/21  Yes [provider]  Blood Glucose Monitoring Suppl (ACCU-CHEK GUIDE ME) w/Device KIT 1 Piece by Does not apply route as directed. 03/16/22   Roma Kayser, MD  Blood Pressure Monitor KIT TAKE BLOOD PRESSURE DAILY 09/15/22   Chilton Si, MD  Continuous Blood Gluc Receiver (FREESTYLE LIBRE 2 READER) DEVI As directed 04/06/22   Roma Kayser, MD  Continuous Blood Gluc Sensor (FREESTYLE LIBRE 2 SENSOR) MISC 1 Piece by Does not apply route every 14 (fourteen) days. 04/06/22   Roma Kayser, MD  glucose blood (ACCU-CHEK GUIDE) test strip Use as instructed 03/16/22   Roma Kayser, MD   Current Facility-Administered Medications  Medication Dose Route Frequency Provider Last Rate Last Admin   0.9 %  sodium chloride infusion  250 mL Intravenous PRN Therisa Doyne, MD       acetaminophen (TYLENOL) tablet 650 mg  650 mg Oral Q6H PRN Therisa Doyne, MD       Or   acetaminophen (TYLENOL) suppository 650 mg  650 mg Rectal Q6H PRN Doutova, Anastassia, MD       atorvastatin (LIPITOR) tablet 10 mg  10 mg Oral QPM Doutova, Anastassia, MD       cefTRIAXone (ROCEPHIN) 2 g in sodium chloride 0.9 % 100 mL IVPB  2 g Intravenous Q24H Therisa Doyne, MD   Stopped at 08/03/23 0136   And   metroNIDAZOLE (FLAGYL) IVPB 500 mg  500 mg Intravenous Q12H Doutova, Anastassia, MD 100 mL/hr at 08/03/23 0316 500 mg at 08/03/23 0316   ferric citrate (AURYXIA) tablet 630 mg  630 mg Oral TID WC Doutova, Anastassia, MD       HYDROcodone-acetaminophen (NORCO/VICODIN) 5-325 MG per tablet 1-2 tablet  1-2 tablet Oral Q4H PRN Doutova, Anastassia, MD       insulin aspart (novoLOG) injection 0-6 Units  0-6 Units Subcutaneous Q4H Therisa Doyne, MD   2 Units at 08/03/23 0514   insulin glargine-yfgn (SEMGLEE) injection 5 Units  5 Units  Subcutaneous QHS Therisa Doyne, MD   5 Units at 08/03/23 0219   levothyroxine (SYNTHROID) tablet 75 mcg  75 mcg Oral Daily Doutova, Anastassia, MD       midodrine (PROAMATINE) tablet 10 mg  10 mg Oral Daily Doutova, Anastassia, MD       ondansetron (ZOFRAN) tablet 4 mg  4 mg Oral Q6H PRN Therisa Doyne, MD  Or   ondansetron (ZOFRAN) injection 4 mg  4 mg Intravenous Q6H PRN Doutova, Anastassia, MD       pantoprazole (PROTONIX) EC tablet 40 mg  40 mg Oral BID Doutova, Anastassia, MD   40 mg at 08/03/23 0303   sodium chloride flush (NS) 0.9 % injection 3 mL  3 mL Intravenous Q12H Doutova, Anastassia, MD   3 mL at 08/03/23 0316   sodium chloride flush (NS) 0.9 % injection 3 mL  3 mL Intravenous PRN Therisa Doyne, MD       Labs: Basic Metabolic Panel: Recent Labs  Lab 08/02/23 1438 08/03/23 0600  NA 134* 135  K 4.1 3.8  CL 99 100  CO2 24 21*  GLUCOSE 157* 223*  BUN 37* 43*  CREATININE 6.21* 6.92*  CALCIUM 7.8* 7.8*  PHOS  --  6.7*   Liver Function Tests: Recent Labs  Lab 08/02/23 1435 08/02/23 1438 08/03/23 0600  AST 14* 13* 11*  ALT 15 15 13   ALKPHOS 134* 131* 117  BILITOT 1.0 0.9 0.6  PROT 7.1 7.0 6.1*  ALBUMIN 4.0 4.0 3.3*   CBC: Recent Labs  Lab 08/02/23 1438 08/03/23 0600  WBC 7.8 6.4  NEUTROABS 6.0  --   HGB 11.4* 9.9*  HCT 36.7 32.7*  MCV 95.6 93.2  PLT 86* 75*   CBG: Recent Labs  Lab 08/02/23 2246 08/03/23 0215 08/03/23 0445  GLUCAP 163* 201* 215*   Iron Studies:  Recent Labs    08/03/23 0019  IRON 52  TIBC 230*  FERRITIN 1,099*   Studies/Results: DG Foot Complete Right  Result Date: 08/02/2023 CLINICAL DATA:  Right foot wound EXAM: RIGHT FOOT COMPLETE - 3 VIEW COMPARISON:  X-ray 05/10/2023. Images only. There is no report available. FINDINGS: Fixation hardware seen about the distal tibia and fibula at the edge of the imaging field. There is severe osteopenia. Global soft tissue swelling about the foot with some plantar  soft tissue gas at the level of the midfoot on the lateral view. Severe osteopenia with multifocal areas of bony deformity consistent with old trauma there is also hallux valgus deformity of the first ray with hypertrophic degenerative changes. 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 No definite erosive changes identified at this time. Vascular calcifications are seen. Plantar calcaneal spur. If there is further concern of bone infection, follow up MRI or bone scan may be useful for further sensitivity. IMPRESSION: Extensive soft tissue swelling about the foot with some new plantar soft tissue gas. Please correlate with clinical findings. Previous surgical changes from amputation of fifth ray, fixation hardware about the ankle. Severe osteopenia with degenerative changes and posttraumatic changes. Electronically Signed   By: Karen Kays M.D.   On: 08/02/2023 15:54    Outpatient Dialysis Orders:  From May 2024: Dialyzes at [facility] TTS 3 hours 45 minutes  EDW 93. HD Bath unknown, Dialyzer unknown, Heparin unknown. Access AVF. Requesting updated records  Assessment/Plan:  R foot wound: Ortho consulting, on empiric antibiotics Hx calciphylaxis: Reports no new wounds recently except for her foot which she reports is due to DM, no longer on sodium thiosulfate  ESRD:  TTS schedule, next HD today. Recent admission for rectal bleeding, no heparin with HD  Hypertension/volume: + edema in legs which she reports is chronic. BP soft, takes midodrine pre-HD. Will attempt 2L UF as tolerated today  Anemia: Hgb 9.9, requesting ESA records  Metabolic bone disease: Phos 6.7, continue binders. Calcium slightly  low but with hx calciphylaxis. Requesting VDRA hx  Nutrition:  Hgb 3.3, will need renal diet/protein supplement and fluid restriction T2DM: On insulin, per admitting team  Rogers Blocker, PA-C 08/03/2023, 8:12 AM  [facility] Pager: [phone]

## 2023-08-03 NOTE — Progress Notes (Signed)
Set up patients CPAP from home and added water to humidifier.  Patient tolerating well.

## 2023-08-03 NOTE — Progress Notes (Signed)
Initial Nutrition Assessment  DOCUMENTATION CODES:   Obesity unspecified  INTERVENTION:  - Continue Renal/CHO mod diet, 1200 mL fluid.   - Add -1 packet Juven BID, each packet provides 95 calories, 2.5 grams of protein (collagen), and 9.8 grams of carbohydrate (3 grams sugar); also contains 7 grams of L-arginine and L-glutamine, 300 mg vitamin C, 15 mg vitamin E, 1.2 mcg vitamin B-12, 9.5 mg zinc, 200 mg calcium, and 1.5 g  Calcium Beta-hydroxy-Beta-methylbutyrate to support wound healing  - Add Nepro Shake po BID, each supplement provides 425 kcal and 19 grams protein  - Add Rena-vit q day.   NUTRITION DIAGNOSIS:   Increased nutrient needs related to wound healing as evidenced by estimated needs.  GOAL:   Patient will meet greater than or equal to 90% of their needs  MONITOR:   PO intake, Supplement acceptance  REASON FOR ASSESSMENT:   Consult Wound healing  ASSESSMENT:   52 y.o. female admits related to wound infection. PMH includes: T2DM, ESRD, chronic foot infection, calciphylaxis. Pt is currently receiving medical management related to wound infection.  Meds reviewed:  lipitor, sliding scale insulin, semglee, synthroid. Labs reviewed: BUN/Creatinine elevated, phosphorus elevated.   Pt was NPO early this am but diet has now been advanced to Renal/CHO mod diet. Pt reports that she is so hungry and ready for breakfast. She also states that she was eating well PTA and denies any wt loss. RD discussed the importance of adequate nutrition in setting of wound healing. Pt agreed to supplementation. RD will add Nepro BID and Juven BID to promote wound healing. RD will continue to monitor PO intakes.   NUTRITION - FOCUSED PHYSICAL EXAM:  WDL - no wasting noted.  Diet Order:   Diet Order             Diet renal/carb modified with fluid restriction Diet-HS Snack? Nothing; Fluid restriction: 1200 mL Fluid; Room service appropriate? Yes; Fluid consistency: Thin  Diet effective  now                   EDUCATION NEEDS:   Not appropriate for education at this time  Skin:  Skin Assessment: Skin Integrity Issues: Skin Integrity Issues:: Stage II Stage II: L/R Buttocks; R thigh  Last BM:  9/12 - type 7  Height:   Ht Readings from Last 1 Encounters:  08/02/23 5\' 6"  (1.676 m)    Weight:   Wt Readings from Last 1 Encounters:  08/02/23 97.5 kg    Ideal Body Weight:     BMI:  Body mass index is 34.7 kg/m.  Estimated Nutritional Needs:   Kcal:  2000-2300 kcals  Protein:  100-115 gm  Fluid:  1 L +UOP  Leshia Kope [place], RD, LDN, CNSC.

## 2023-08-03 NOTE — Assessment & Plan Note (Signed)
Presents on admission. See wound consult note. Silicone foam for sacrum.

## 2023-08-03 NOTE — Progress Notes (Signed)
PT Cancellation Note  Patient Details Name: Tiyana Kerkstra MRN: [id number] DOB: Nov 04, 1971   Cancelled Treatment:    Reason Eval/Treat Not Completed: Patient declined, no reason specified (pt reporting long night with interruptions and needs >hr before she will be willing to mobilize. Transfers to [facility] at baseline)   Anchor Dwan B Dequincy Born 08/03/2023, 7:53 AM Merryl Hacker, PT Acute Rehabilitation Services Office: [phone]

## 2023-08-03 NOTE — Consult Note (Addendum)
Hospital Consult    Reason for Consult:  PAD Requesting Physician:  Imogene Burn MRN #:  [id number]  History of Present Illness: This is a 52 y.o. female who presented to the hospital with a right foot wound that has been present for about 3 months without improvement and recent increased drainage that [facility] nurse was concerned about.    She underwent arteriography on 01/25/2022 by Dr. Myra Gianotti and at that time, she was found to have 2 vessel runoff via the peroneal and PTA on the right and dominant runoff on the left was the peroneal artery.  There was no evidence of inflow or outflow stenosis.  Pt has hx of ESRD and calciphylaxis and followed by Dr. Lajoyce Corners.  She follows with him for her lower extremity edema with compression wraps.  She was seen by him on 07/03/2023 with BLE wounds with calciphylaxis.  Her legs were improving and she was to continue weekly compression wraps.     She states that the nurse has been coming out twice a week to change her wraps and when she changed them last, she stated there was some thick drainage and increased drainage that the nurse was concerned about so she came to the ER for evaluation.  She tells me that she was originally placed in the wraps for bilateral calciphylaxis wounds on her calves but they have since healed.  She does not walk and uses a wheelchair to mobilize.  She denies any pain in her feet at night with the exception of an occasional shooting neuropathy pain.  She does not have feeling in her feet.    Right foot xray reveals soft tissue swelling with some new plantar soft tissue gas.  She does have fixation hardware right ankle.    She continues to use the left [name] AVF without issues.  It has not had any interventions since being created.   She does not have any symptoms of steal syndrome.    Dialysis access hx: -left BC AVF 08/18/2020 Dr. Darrick Penna  She dialyzes T/T/S at [facility] in [name], [place].   The pt is on a statin for cholesterol management.  The pt is  not on a daily aspirin.   Other AC:  none The pt is not on medication for hypertension.   The pt is  on medication for diabetes PTA. Tobacco hx:  never  Past Medical History:  Diagnosis Date   Anemia    Blindness of right eye with low vision in contralateral eye    s/p victrectomy   Chronic diastolic heart failure (HCC) 03/30/2021   Diabetes mellitus, type II (HCC)    Dyslipidemia    Glaucoma    History of blood transfusion    Hypertension    Hypothyroidism (acquired)    Kidney disease    Stage 5   Mitral regurgitation 11/16/2022   Pneumonia    Pulmonary hypertension, unspecified (HCC) 09/12/2022    Past Surgical History:  Procedure Laterality Date   ABDOMINAL AORTOGRAM W/LOWER EXTREMITY Bilateral 12/18/2020   Procedure: ABDOMINAL AORTOGRAM W/LOWER EXTREMITY;  Surgeon: Sherren Kerns, MD;  Location: [facility];  Service: Cardiovascular;  Laterality: Bilateral;   ABDOMINAL AORTOGRAM W/LOWER EXTREMITY Bilateral 01/25/2022   Procedure: ABDOMINAL AORTOGRAM W/LOWER EXTREMITY;  Surgeon: Nada Libman, MD;  Location: [facility] INVAS[facility] CV LAB;  Service: Cardiovascular;  Laterality: Bilateral;   AMPUTATION Right 02/16/2022   Procedure: RIGHT FOOT 5TH RAY AMPUTATION;  Surgeon: Nadara Mustard, MD;  Location: [facility] OR;  Service: Orthopedics;  Laterality: Right;   ANKLE FRACTURE SURGERY Right    AV FISTULA PLACEMENT Left 08/18/2020   Procedure: LEFT ARM BRACHIOCEPHALIC ARTERIOVENOUS (AV) FISTULA CREATION;  Surgeon: Sherren Kerns, MD;  Location: [facility] OR;  Service: Vascular;  Laterality: Left;   BIOPSY  04/24/2021   Procedure: BIOPSY;  Surgeon: Lanelle Bal, DO;  Location: [facility];  Service: Endoscopy;;   CESAREAN SECTION     CHOLECYSTECTOMY     COLONOSCOPY  04/24/2021   Surgeon: Lanelle Bal, DO;  nonbleeding internal hemorrhoids, 1 large (25 mm) pedunculated transverse colon polyp (prolapse type polyp) with adherent clot and stigmata of recent bleed.   COLONOSCOPY WITH  PROPOFOL N/A 05/14/2021   Procedure: COLONOSCOPY WITH PROPOFOL;  Surgeon: Corbin Ade, MD;  Location: [facility];  Service: Endoscopy;  Laterality: N/A;   COLONOSCOPY WITH PROPOFOL N/A 03/17/2023   Procedure: COLONOSCOPY WITH PROPOFOL;  Surgeon: Corbin Ade, MD;  [facility]: [facility];  Service: Endoscopy;  Laterality: N/A;   ESOPHAGOGASTRODUODENOSCOPY (EGD) WITH PROPOFOL N/A 04/24/2021   Surgeon: Lanelle Bal, DO;  duodenal erosions and gastritis biopsied (pathology with peptic duodenitis, reactive gastropathy with erosions/chronic inflammation, negative for H. pylori)   EYE SURGERY     Vatrectomy   HEMOSTASIS CLIP PLACEMENT  05/14/2021   Procedure: HEMOSTASIS CLIP PLACEMENT;  Surgeon: Corbin Ade, MD;  [facility];  Service: Endoscopy;;   IR PERC TUN PERIT CATH WO PORT S&I /IMAG  09/15/2020   IR REMOVAL TUN CV CATH W/O FL  02/19/2021   IR US GUIDE VASC ACCESS RIGHT  09/15/2020   POLYPECTOMY  04/24/2021   Procedure: POLYPECTOMY;  Surgeon: Lanelle Bal, DO;  [facility];  Service: Endoscopy;;   POLYPECTOMY  05/14/2021   Procedure: POLYPECTOMY;  Surgeon: Corbin Ade, MD;  [facility];  Service: Endoscopy;;   POLYPECTOMY  03/17/2023   Procedure: POLYPECTOMY;  Surgeon: Corbin Ade, MD;  [facility];  Service: Endoscopy;;   SKIN SPLIT GRAFT Bilateral 09/03/2021   Procedure: SKIN GRAFT BILATERAL LEGS;  Surgeon: Nadara Mustard, MD;  Location: [name] OR;  Service: Orthopedics;  Laterality: Bilateral;   SKIN SPLIT GRAFT Left 12/10/2021   Procedure: IRRIGATION AND DEBRIDEMENT LEFT CALF, APPLICATION SPLIT THICKNESS SKIN GRAFT;  Surgeon: Nadara Mustard, MD;  Location: [facility] OR;  Service: Orthopedics;  Laterality: Left;   TOE SURGERY      Allergies  Allergen Reactions   Ace Inhibitors Cough    Prior to Admission medications   Medication Sig Start Date End Date Taking? Authorizing Provider  acetaminophen (TYLENOL) 500 MG  tablet Take 1,000 mg by mouth every 6 (six) hours as needed for moderate pain.   Yes [provider]  atorvastatin (LIPITOR) 10 MG tablet Take 10 mg by mouth every evening.   Yes [provider]  AURYXIA 1 GM 210 MG(Fe) tablet Take 630 mg by mouth 3 (three) times daily with meals. 01/07/22  Yes [provider]  HUMALOG KWIKPEN 100 UNIT/ML KwikPen Inject 5-11 Units into the skin 3 (three) times daily with meals. If eats 50% or more of meal. Patient taking differently: Inject 5-11 Units into the skin 3 (three) times daily with meals. If eats 50% or more of meal. Sliding scale 03/16/22  Yes Nida, Denman George, MD  insulin glargine (LANTUS) 100 UNIT/ML injection Inject 0.2 mLs (20 Units total) into the skin at bedtime. Patient taking differently: Inject 8 Units into the skin  at bedtime. 04/06/22  Yes Nida, Denman George, MD  levothyroxine (SYNTHROID) 75 MCG tablet Take 75 mcg by mouth daily. 06/13/22  Yes [provider]  midodrine (PROAMATINE) 10 MG tablet Take 10 mg by mouth daily. 09/27/22  Yes [provider]  pantoprazole (PROTONIX) 40 MG tablet Take 1 tablet (40 mg total) by mouth 2 (two) times daily. 04/25/21  Yes Emokpae, Courage, MD  polyethylene glycol powder (MIRALAX) 17 GM/SCOOP powder Take 17 g by mouth daily. 03/17/23  Yes Narda Bonds, MD  Vitamin D, Ergocalciferol, (DRISDOL) 1.25 MG (50000 UNIT) CAPS capsule Take 50,000 Units by mouth every 7 (seven) days. Friday 04/20/21  Yes [provider]  Blood Glucose Monitoring Suppl (ACCU-CHEK GUIDE ME) w/Device KIT 1 Piece by Does not apply route as directed. 03/16/22   Roma Kayser, MD  Blood Pressure Monitor KIT TAKE BLOOD PRESSURE DAILY 09/15/22   Chilton Si, MD  Continuous Blood Gluc Receiver (FREESTYLE LIBRE 2 READER) DEVI As directed 04/06/22   Roma Kayser, MD  Continuous Blood Gluc Sensor (FREESTYLE LIBRE 2 SENSOR) MISC 1 Piece by Does not apply route every 14  (fourteen) days. 04/06/22   Roma Kayser, MD  glucose blood (ACCU-CHEK GUIDE) test strip Use as instructed 03/16/22   Roma Kayser, MD    Social History   Socioeconomic History   Marital status: Single    Spouse name: Not on file   Number of children: Not on file   Years of education: Not on file   Highest education level: Not on file  Occupational History   Not on file  Tobacco Use   Smoking status: Never   Smokeless tobacco: Never  Vaping Use   Vaping status: Never Used  Substance and Sexual Activity   Alcohol use: No   Drug use: No   Sexual activity: Yes    Birth control/protection: Condom  Other Topics Concern   Not on file  Social History Narrative   Not on file   Social Determinants of Health   Financial Resource Strain: Not on file  Food Insecurity: Food Insecurity Present (08/03/2023)   Hunger Vital Sign    Worried About Running Out of Food in the Last Year: Sometimes true    Ran Out of Food in the Last Year: Sometimes true  Transportation Needs: Unmet Transportation Needs (08/03/2023)   PRAPARE - Administrator, Civil Service (Medical): Yes    Lack of Transportation (Non-Medical): Yes  Physical Activity: Not on file  Stress: Not on file  Social Connections: Not on file  Intimate Partner Violence: Not At Risk (08/03/2023)   Humiliation, Afraid, Rape, and Kick questionnaire    Fear of Current or Ex-Partner: No    Emotionally Abused: No    Physically Abused: No    Sexually Abused: No     Family History  Problem Relation Age of Onset   Heart failure Mother    Heart disease Mother    Diabetes Mother    Kidney disease Mother    Heart failure Father    Diabetes Father    Heart disease Father    Diabetes Brother    Heart failure Maternal Grandmother    Heart failure Maternal Grandfather    Transient ischemic attack Maternal Grandfather    Colon cancer Neg Hx     ROS: [x]  Positive   [ ]  Negative   [ ]  All sytems reviewed and  are negative  Cardiac: [x]  dyslipidemia [x]  hx MR  Vascular: []  pain in legs while walking []  pain in legs at rest []  pain in legs at night []  non-healing ulcers []  hx of DVT []  swelling in legs  Pulmonary: []  asthma/wheezing []  home O2  Neurologic: []  hx of CVA []  mini stroke   Hematologic: []  hx of cancer [x]  anemia  Endocrine:   [x]  diabetes [x]  thyroid disease  GI []  GERD  GU: [x]  CKD/renal failure [x]  HD--[]  M/W/F or [x]  T/T/S  Psychiatric: []  anxiety []  depression  Musculoskeletal: []  arthritis []  joint pain  Integumentary: []  rashes []  ulcers  Constitutional: []  fever  []  chills  Physical Examination  Vitals:   08/03/23 0211 08/03/23 0754  BP: 93/60 95/64  Pulse: 71 68  Resp: 18   Temp: 97.7 F (36.5 C) (!) 97.5 F (36.4 C)  SpO2: 97% 99%   Body mass index is 34.7 kg/m.  General:  WDWN in NAD Gait: Not observed HENT: WNL, normocephalic Pulmonary: normal non-labored breathing Cardiac: regular Abdomen:  soft, NT Skin: without rashes Vascular Exam/Pulses: Right radial pulse is palpable; unable to palpate left radial pulse Pedal pulses are not palpable Extremities: + thrill in LUA AVF; bilateral feet are warm      Musculoskeletal: no muscle wasting or atrophy  Neurologic: A&O X 3 Psychiatric:  The pt has Normal affect.   CBC    Component Value Date/Time   WBC 6.4 08/03/2023 0600   RBC 3.51 (L) 08/03/2023 0600   HGB 9.9 (L) 08/03/2023 0600   HCT 32.7 (L) 08/03/2023 0600   PLT 75 (L) 08/03/2023 0600   MCV 93.2 08/03/2023 0600   MCH 28.2 08/03/2023 0600   MCHC 30.3 08/03/2023 0600   RDW 15.4 08/03/2023 0600   LYMPHSABS 0.8 08/02/2023 1438   MONOABS 0.8 08/02/2023 1438   EOSABS 0.2 08/02/2023 1438   BASOSABS 0.0 08/02/2023 1438    BMET    Component Value Date/Time   NA 135 08/03/2023 0600   K 3.8 08/03/2023 0600   CL 100 08/03/2023 0600   CO2 21 (L) 08/03/2023 0600   GLUCOSE 223 (H) 08/03/2023 0600   BUN 43  (H) 08/03/2023 0600   CREATININE 6.92 (H) 08/03/2023 0600   CALCIUM 7.8 (L) 08/03/2023 0600   GFRNONAA 7 (L) 08/03/2023 0600   GFRAA 10 (L) 06/11/2020 0336    COAGS: Lab Results  Component Value Date   INR 1.1 03/15/2023   INR 1.1 10/01/2022   INR 1.2 04/29/2022     Non-Invasive Vascular Imaging:   ABI/TBI 08/03/2023: ABI Findings:  +---------+------------------+-----+----------+--------+  Right   Rt Pressure (mmHg)IndexWaveform  Comment   +---------+------------------+-----+----------+--------+  Brachial 129                    triphasic           +---------+------------------+-----+----------+--------+  PTA     254               1.97 monophasic          +---------+------------------+-----+----------+--------+  DP      254               1.97 monophasic          +---------+------------------+-----+----------+--------+  Great Toe55                0.43                     +---------+------------------+-----+----------+--------+   +--------+------------------+-----+------------------+----------+  Left   Lt Pressure (mmHg)IndexWaveform  Comment     +--------+------------------+-----+------------------+----------+  Brachial                                        Restricted  +--------+------------------+-----+------------------+----------+  PTA                           Unable to insonateInduration  +--------+------------------+-----+------------------+----------+  DP     254               1.97                               +--------+------------------+-----+------------------+----------+   +-------+-----------+-----------+------------+------------+  ABI/TBIToday's ABIToday's TBIPrevious ABIPrevious TBI  +-------+-----------+-----------+------------+------------+  Right [place]         0.43                                 +-------+-----------+-----------+------------+------------+  Left  [name]                                               +-------+-----------+-----------+------------+------------+   CTA 09/30/2022: FINDINGS: VASCULAR   Aorta: Moderate atherosclerotic calcification of the aorta, advanced for patient's age. No aneurysmal dilatation or dissection. No periaortic fluid collection.   Celiac: Atherosclerotic calcification the celiac trunk and major branches. These vessels however remain patent.   SMA: The SMA is patent.   Renals: The renal arteries are patent.   IMA: The IMA is patent.   Inflow: Moderate atherosclerotic calcification of the iliac arteries. The iliac arteries remain patent.   Proximal Outflow: Atherosclerotic calcification. The visualized common femoral arteries and proximal deep and superficial femoral arteries are patent.   Veins: The IVC is unremarkable. The SMV, splenic vein, and main portal vein are patent. No portal venous gas.   ASSESSMENT/PLAN: This is a 52 y.o. female with ESRD, DM with right foot wound that has been present for about 3 months  Right foot wound -ABI with [place] vessels and toe pressure of 55.  Difficulty palpating femoral pulses.   CTA from November 2023 revealed moderate calcification of iliac arteries but were patent as well as CFA and proximal deep and SFA were patent at that time.  She has hx of arteriography in March 2023 with two vessel runoff on the right via peroneal and PTA at that time -currently on empiric abx -most likely will require repeat arteriography to evaluate ability to heal wound.   -pt is on lipitor.  She is currently not on asa.   ESRD -LUA AVF working well without hx of any interventions.  She does not have evidence of steal syndrome.   Dr. Lenell Antu to evaluate pt and determine further plan   Doreatha Massed, PA-C [facility] [phone]  VASCULAR STAFF ADDENDUM: I have independently interviewed and examined the patient. I agree with the above.  Plan RLE angiogram with me tomorrow in  cath lab as schedule allows.  Rande Brunt. Lenell Antu, MD [facility] Phone Number: [phone] 08/03/2023 4:56 PM

## 2023-08-03 NOTE — Assessment & Plan Note (Signed)
Baseline HgB 9-10 g/dl

## 2023-08-03 NOTE — Consult Note (Signed)
Reason for Consult:Right foot ulcer Referring Physician: Carollee Herter Time called: [id number] Time at bedside: 6213   Amy Moses is an 52 y.o. female.  HPI: Amy Moses came to the ED yesterday on advice from [facility] 2/2 right plantar ulcer enlarging and beginning to drain more heavily. She denies any other new symptoms. She's been seeing Dr. Lajoyce Corners for this and her LE edema and has made progress with the edema with compression wraps. She denies fevers, chills, sweats, N/V, or pain.  Past Medical History:  Diagnosis Date   Anemia    Blindness of right eye with low vision in contralateral eye    s/p victrectomy   Chronic diastolic heart failure (HCC) 03/30/2021   Diabetes mellitus, type II (HCC)    Dyslipidemia    Glaucoma    History of blood transfusion    Hypertension    Hypothyroidism (acquired)    Kidney disease    Stage 5   Mitral regurgitation 11/16/2022   Pneumonia    Pulmonary hypertension, unspecified (HCC) 09/12/2022    Past Surgical History:  Procedure Laterality Date   ABDOMINAL AORTOGRAM W/LOWER EXTREMITY Bilateral 12/18/2020   Procedure: ABDOMINAL AORTOGRAM W/LOWER EXTREMITY;  Surgeon: Sherren Kerns, MD;  Location: [facility] LAB;  Service: Cardiovascular;  Laterality: Bilateral;   ABDOMINAL AORTOGRAM W/LOWER EXTREMITY Bilateral 01/25/2022   Procedure: ABDOMINAL AORTOGRAM W/LOWER EXTREMITY;  Surgeon: Nada Libman, MD;  Location: [facility] CV LAB;  Service: Cardiovascular;  Laterality: Bilateral;   AMPUTATION Right 02/16/2022   Procedure: RIGHT FOOT 5TH RAY AMPUTATION;  Surgeon: Nadara Mustard, MD;  Location: [facility] OR;  Service: Orthopedics;  Laterality: Right;   ANKLE FRACTURE SURGERY Right    AV FISTULA PLACEMENT Left 08/18/2020   Procedure: LEFT ARM BRACHIOCEPHALIC ARTERIOVENOUS (AV) FISTULA CREATION;  Surgeon: Sherren Kerns, MD;  Location: [facility] OR;  Service: Vascular;  Laterality: Left;   BIOPSY  04/24/2021   Procedure: BIOPSY;  Surgeon: Lanelle Bal, DO;   Location: [facility] SU[facility];  Service: Endoscopy;;   CESAREAN SECTION     CHOLECYSTECTOMY     COLONOSCOPY  04/24/2021   Surgeon: Lanelle Bal, DO;  nonbleeding internal hemorrhoids, 1 large (25 mm) pedunculated transverse colon polyp (prolapse type polyp) with adherent clot and stigmata of recent bleed.   COLONOSCOPY WITH PROPOFOL N/A 05/14/2021   Procedure: COLONOSCOPY WITH PROPOFOL;  Surgeon: Corbin Ade, MD;  [facility]: [facility];  Service: Endoscopy;  Laterality: N/A;   COLONOSCOPY WITH PROPOFOL N/A 03/17/2023   Procedure: COLONOSCOPY WITH PROPOFOL;  Surgeon: Corbin Ade, MD;  [facility];  Service: Endoscopy;  Laterality: N/A;   ESOPHAGOGASTRODUODENOSCOPY (EGD) WITH PROPOFOL N/A 04/24/2021   Surgeon: Lanelle Bal, DO;  duodenal erosions and gastritis biopsied (pathology with peptic duodenitis, reactive gastropathy with erosions/chronic inflammation, negative for H. pylori)   EYE SURGERY     Vatrectomy   HEMOSTASIS CLIP PLACEMENT  05/14/2021   Procedure: HEMOSTASIS CLIP PLACEMENT;  Surgeon: Corbin Ade, MD;  [facility];  Service: Endoscopy;;   IR PERC TUN PERIT CATH WO PORT S&I /IMAG  09/15/2020   IR REMOVAL TUN CV CATH W/O FL  02/19/2021   IR US GUIDE VASC ACCESS RIGHT  09/15/2020   POLYPECTOMY  04/24/2021   Procedure: POLYPECTOMY;  Surgeon: Lanelle Bal, DO;  [facility];  Service: Endoscopy;;   POLYPECTOMY  05/14/2021   Procedure: POLYPECTOMY;  Surgeon: Corbin Ade, MD;  [facility];  Service:  Endoscopy;;   POLYPECTOMY  03/17/2023   Procedure: POLYPECTOMY;  Surgeon: Corbin Ade, MD;  [facility];  Service: Endoscopy;;   SKIN SPLIT GRAFT Bilateral 09/03/2021   Procedure: SKIN GRAFT BILATERAL LEGS;  Surgeon: Nadara Mustard, MD;  Location: [name] OR;  Service: Orthopedics;  Laterality: Bilateral;   SKIN SPLIT GRAFT Left 12/10/2021   Procedure: IRRIGATION AND DEBRIDEMENT LEFT CALF, APPLICATION SPLIT  THICKNESS SKIN GRAFT;  Surgeon: Nadara Mustard, MD;  Location: [facility] OR;  Service: Orthopedics;  Laterality: Left;   TOE SURGERY      Family History  Problem Relation Age of Onset   Heart failure Mother    Heart disease Mother    Diabetes Mother    Kidney disease Mother    Heart failure Father    Diabetes Father    Heart disease Father    Diabetes Brother    Heart failure Maternal Grandmother    Heart failure Maternal Grandfather    Transient ischemic attack Maternal Grandfather    Colon cancer Neg Hx     Social History:  reports that she has never smoked. She has never used smokeless tobacco. She reports that she does not drink alcohol and does not use drugs.  Allergies:  Allergies  Allergen Reactions   Ace Inhibitors Cough    Medications: I have reviewed the patient's current medications.  Results for orders placed or performed during the hospital encounter of 08/02/23 (from the past 48 hour(s))  Reticulocytes     Status: None   Collection Time: 08/02/23  2:35 PM  Result Value Ref Range   Retic Ct Pct 1.7 0.4 - 3.1 %   RBC. 3.91 3.87 - 5.11 MIL/uL   Retic Count, Absolute 66.9 19.0 - 186.0 K/uL   Immature Retic Fract 10.4 2.3 - 15.9 %    Comment: Performed at [facility] Lab, [address], [place], [place] [address]  Procalcitonin     Status: None   Collection Time: 08/02/23  2:35 PM  Result Value Ref Range   Procalcitonin 1.58 ng/mL    Comment:        Interpretation: PCT > 0.5 ng/mL and <= 2 ng/mL: Systemic infection (sepsis) is possible, but other conditions are known to elevate PCT as well. (NOTE)       Sepsis PCT Algorithm           Lower Respiratory Tract                                      Infection PCT Algorithm    ----------------------------     ----------------------------         PCT < 0.25 ng/mL                PCT < 0.10 ng/mL          Strongly encourage             Strongly discourage   discontinuation of antibiotics    initiation of  antibiotics    ----------------------------     -----------------------------       PCT 0.25 - 0.50 ng/mL            PCT 0.10 - 0.25 ng/mL               OR       >80% decrease in PCT  Discourage initiation of                                            antibiotics      Encourage discontinuation           of antibiotics    ----------------------------     -----------------------------         PCT >= 0.50 ng/mL              PCT 0.26 - 0.50 ng/mL                AND       <80% decrease in PCT             Encourage initiation of                                             antibiotics       Encourage continuation           of antibiotics    ----------------------------     -----------------------------        PCT >= 0.50 ng/mL                  PCT > 0.50 ng/mL               AND         increase in PCT                  Strongly encourage                                      initiation of antibiotics    Strongly encourage escalation           of antibiotics                                     -----------------------------                                           PCT <= 0.25 ng/mL                                                 OR                                        > 80% decrease in PCT                                      Discontinue / Do not initiate  antibiotics  Performed at [facility], [address] [place], [place] [address]   TSH     Status: None   Collection Time: 08/02/23  2:35 PM  Result Value Ref Range   TSH 4.315 0.350 - 4.500 uIU/mL    Comment: Performed by a 3rd Generation assay with a functional sensitivity of <=0.01 uIU/mL. Performed at [facility], [address] [place], [place] [address]   Hepatic function panel     Status: Abnormal   Collection Time: 08/02/23  2:35 PM  Result Value Ref Range   Total Protein 7.1 6.5 - 8.1 g/dL   Albumin 4.0 3.5 - 5.0 g/dL   AST 14 (L) 15 - 41 U/L   ALT  15 0 - 44 U/L   Alkaline Phosphatase 134 (H) 38 - 126 U/L   Total Bilirubin 1.0 0.3 - 1.2 mg/dL   Bilirubin, Direct 0.3 (H) 0.0 - 0.2 mg/dL   Indirect Bilirubin 0.7 0.3 - 0.9 mg/dL    Comment: Performed at [facility], [address] [place], [place] [address]  Prealbumin     Status: None   Collection Time: 08/02/23  2:35 PM  Result Value Ref Range   Prealbumin 23 18 - 38 mg/dL    Comment: Performed at [facility], [address], [place], [place] [address]  Comprehensive metabolic panel     Status: Abnormal   Collection Time: 08/02/23  2:38 PM  Result Value Ref Range   Sodium 134 (L) 135 - 145 mmol/L   Potassium 4.1 3.5 - 5.1 mmol/L   Chloride 99 98 - 111 mmol/L   CO2 24 22 - 32 mmol/L   Glucose, Bld 157 (H) 70 - 99 mg/dL    Comment: Glucose reference range applies only to samples taken after fasting for at least 8 hours.   BUN 37 (H) 6 - 20 mg/dL   Creatinine, Ser 0.27 (H) 0.44 - 1.00 mg/dL   Calcium 7.8 (L) 8.9 - 10.3 mg/dL   Total Protein 7.0 6.5 - 8.1 g/dL   Albumin 4.0 3.5 - 5.0 g/dL   AST 13 (L) 15 - 41 U/L   ALT 15 0 - 44 U/L   Alkaline Phosphatase 131 (H) 38 - 126 U/L   Total Bilirubin 0.9 0.3 - 1.2 mg/dL   GFR, Estimated 8 (L) >60 mL/min    Comment: (NOTE) Calculated using the CKD-EPI Creatinine Equation (2021)    Anion gap 11 5 - 15    Comment: Performed at [facility], [address] [place], [place] [address]  CBC with Differential     Status: Abnormal   Collection Time: 08/02/23  2:38 PM  Result Value Ref Range   WBC 7.8 4.0 - 10.5 K/uL   RBC 3.84 (L) 3.87 - 5.11 MIL/uL   Hemoglobin 11.4 (L) 12.0 - 15.0 g/dL   HCT 44.0 34.7 - 42.5 %   MCV 95.6 80.0 - 100.0 fL   MCH 29.7 26.0 - 34.0 pg   MCHC 31.1 30.0 - 36.0 g/dL   RDW 95.6 38.7 - 56.4 %   Platelets 86 (L) 150 - 400 K/uL    Comment: Immature Platelet Fraction may be clinically indicated, consider ordering this additional test [id number] REPEATED TO VERIFY    nRBC 0.0 0.0 - 0.2 %    Neutrophils Relative % 75 %   Neutro Abs 6.0 1.7 - 7.7 K/uL   Lymphocytes Relative 11 %   Lymphs  Abs 0.8 0.7 - 4.0 K/uL   Monocytes Relative 10 %   Monocytes Absolute 0.8 0.1 - 1.0 K/uL   Eosinophils Relative 2 %   Eosinophils Absolute 0.2 0.0 - 0.5 K/uL   Basophils Relative 1 %   Basophils Absolute 0.0 0.0 - 0.1 K/uL   Immature Granulocytes 1 %   Abs Immature Granulocytes 0.04 0.00 - 0.07 K/uL    Comment: Performed at [facility], [address], [place], [place] [address]  I-Stat Lactic Acid, ED     Status: None   Collection Time: 08/02/23  2:42 PM  Result Value Ref Range   Lactic Acid, Venous 0.8 0.5 - 1.9 mmol/L  Blood Cultures x 2 sites     Status: None (Preliminary result)   Collection Time: 08/02/23  6:35 PM   Specimen: BLOOD  Result Value Ref Range   Specimen Description BLOOD SITE NOT SPECIFIED    Special Requests      BOTTLES DRAWN AEROBIC AND ANAEROBIC Blood Culture results may not be optimal due to an inadequate volume of blood received in culture bottles   Culture      NO GROWTH < 24 HOURS Performed at [facility], [address], [place], [place] [address]    Report Status PENDING   Blood Cultures x 2 sites     Status: None (Preliminary result)   Collection Time: 08/02/23  6:40 PM   Specimen: BLOOD  Result Value Ref Range   Specimen Description BLOOD SITE NOT SPECIFIED    Special Requests      BOTTLES DRAWN AEROBIC AND ANAEROBIC Blood Culture adequate volume   Culture      NO GROWTH < 24 HOURS Performed at [facility], [address], [place], [place] [address]    Report Status PENDING   I-Stat Lactic Acid, ED     Status: None   Collection Time: 08/02/23  6:50 PM  Result Value Ref Range   Lactic Acid, Venous 1.0 0.5 - 1.9 mmol/L  POC CBG, ED     Status: Abnormal   Collection Time: 08/02/23 10:46 PM  Result Value Ref Range   Glucose-Capillary 163 (H) 70 - 99 mg/dL    Comment: Glucose reference range applies only to samples taken  after fasting for at least 8 hours.  Vitamin B12     Status: None   Collection Time: 08/03/23 12:19 AM  Result Value Ref Range   Vitamin B-12 297 180 - 914 pg/mL    Comment: (NOTE) This assay is not validated for testing neonatal or myeloproliferative syndrome specimens for Vitamin B12 levels. Performed at [facility], [address], [place], [place] [address]   Folate     Status: None   Collection Time: 08/03/23 12:19 AM  Result Value Ref Range   Folate 8.9 >5.9 ng/mL    Comment: Performed at [facility], [address] [place], [place] [address]  Prealbumin     Status: None   Collection Time: 08/03/23 12:19 AM  Result Value Ref Range   Prealbumin 22 18 - 38 mg/dL    Comment: Performed at [facility], [address] [place], [place] [address]  C-reactive protein     Status: Abnormal   Collection Time: 08/03/23 12:19 AM  Result Value Ref Range   CRP 1.7 (H) <1.0 mg/dL    Comment: Performed at [facility], [address], [place], [place] [address]  Sedimentation rate  Status: None   Collection Time: 08/03/23 12:19 AM  Result Value Ref Range   Sed Rate 7 0 - 22 mm/hr    Comment: Performed at [facility], [address], [place], [place] [address]  Iron and TIBC     Status: Abnormal   Collection Time: 08/03/23 12:19 AM  Result Value Ref Range   Iron 52 28 - 170 ug/dL   TIBC 213 (L) 086 - 578 ug/dL   Saturation Ratios 23 10.4 - 31.8 %   UIBC 178 ug/dL    Comment: Performed at [name], [address] [place], [place] [address]  Ferritin     Status: Abnormal   Collection Time: 08/03/23 12:19 AM  Result Value Ref Range   Ferritin 1,099 (H) 11 - 307 ng/mL    Comment: Performed at [facility], [address], [place], [place] [address]  Glucose, capillary     Status: Abnormal   Collection Time: 08/03/23  2:15 AM  Result Value Ref Range   Glucose-Capillary 201 (H) 70 - 99 mg/dL    Comment: Glucose reference range  applies only to samples taken after fasting for at least 8 hours.  MRSA Next Gen by PCR, Nasal     Status: None   Collection Time: 08/03/23  4:40 AM   Specimen: Nasal Mucosa; Nasal Swab  Result Value Ref Range   MRSA by PCR Next Gen NOT DETECTED NOT DETECTED    Comment: (NOTE) The GeneXpert MRSA Assay (FDA approved for NASAL specimens only), is one component of a comprehensive MRSA colonization surveillance program. It is not intended to diagnose MRSA infection nor to guide or monitor treatment for MRSA infections. Test performance is not FDA approved in patients less than 37 years old. Performed at [facility], [address] [place], [place] [address]   Glucose, capillary     Status: Abnormal   Collection Time: 08/03/23  4:45 AM  Result Value Ref Range   Glucose-Capillary 215 (H) 70 - 99 mg/dL    Comment: Glucose reference range applies only to samples taken after fasting for at least 8 hours.  Magnesium     Status: None   Collection Time: 08/03/23  6:00 AM  Result Value Ref Range   Magnesium 2.2 1.7 - 2.4 mg/dL    Comment: Performed at [name], [address], [place], [place] [address]  Phosphorus     Status: Abnormal   Collection Time: 08/03/23  6:00 AM  Result Value Ref Range   Phosphorus 6.7 (H) 2.5 - 4.6 mg/dL    Comment: Performed at [facility], [address] [place], [place] [address]  Comprehensive metabolic panel     Status: Abnormal   Collection Time: 08/03/23  6:00 AM  Result Value Ref Range   Sodium 135 135 - 145 mmol/L   Potassium 3.8 3.5 - 5.1 mmol/L   Chloride 100 98 - 111 mmol/L   CO2 21 (L) 22 - 32 mmol/L   Glucose, Bld 223 (H) 70 - 99 mg/dL    Comment: Glucose reference range applies only to samples taken after fasting for at least 8 hours.   BUN 43 (H) 6 - 20 mg/dL   Creatinine, Ser 4.40 (H) 0.44 - 1.00 mg/dL   Calcium 7.8 (L) 8.9 - 10.3 mg/dL   Total Protein 6.1 (L) 6.5 - 8.1 g/dL   Albumin 3.3 (L) 3.5 - 5.0 g/dL   AST 11  (  L) 15 - 41 U/L   ALT 13 0 - 44 U/L   Alkaline Phosphatase 117 38 - 126 U/L   Total Bilirubin 0.6 0.3 - 1.2 mg/dL   GFR, Estimated 7 (L) >60 mL/min    Comment: (NOTE) Calculated using the CKD-EPI Creatinine Equation (2021)    Anion gap 14 5 - 15    Comment: Performed at [facility], [address] [place], [place] [address]  CBC     Status: Abnormal   Collection Time: 08/03/23  6:00 AM  Result Value Ref Range   WBC 6.4 4.0 - 10.5 K/uL   RBC 3.51 (L) 3.87 - 5.11 MIL/uL   Hemoglobin 9.9 (L) 12.0 - 15.0 g/dL   HCT 21.3 (L) 08.6 - 57.8 %   MCV 93.2 80.0 - 100.0 fL   MCH 28.2 26.0 - 34.0 pg   MCHC 30.3 30.0 - 36.0 g/dL   RDW 46.9 62.9 - 52.8 %   Platelets 75 (L) 150 - 400 K/uL    Comment: Immature Platelet Fraction may be clinically indicated, consider ordering this additional test [id number] REPEATED TO VERIFY    nRBC 0.0 0.0 - 0.2 %    Comment: Performed at [facility], [address] [place], [place] [address]  Glucose, capillary     Status: Abnormal   Collection Time: 08/03/23  8:14 AM  Result Value Ref Range   Glucose-Capillary 163 (H) 70 - 99 mg/dL    Comment: Glucose reference range applies only to samples taken after fasting for at least 8 hours.    DG Foot Complete Right  Result Date: 08/02/2023 CLINICAL DATA:  Right foot wound EXAM: RIGHT FOOT COMPLETE - 3 VIEW COMPARISON:  X-ray 05/10/2023. Images only. There is no report available. FINDINGS: Fixation hardware seen about the distal tibia and fibula at the edge of the imaging field. There is severe osteopenia. Global soft tissue swelling about the foot with some plantar soft tissue gas at the level of the midfoot on the lateral view. Severe osteopenia with multifocal areas of bony deformity consistent with old trauma there is also hallux valgus deformity of the first ray with hypertrophic degenerative changes. 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 No definite  erosive changes identified at this time. Vascular calcifications are seen. Plantar calcaneal spur. If there is further concern of bone infection, follow up MRI or bone scan may be useful for further sensitivity. IMPRESSION: Extensive soft tissue swelling about the foot with some new plantar soft tissue gas. Please correlate with clinical findings. Previous surgical changes from amputation of fifth ray, fixation hardware about the ankle. Severe osteopenia with degenerative changes and posttraumatic changes. Electronically Signed   By: Karen Kays M.D.   On: 08/02/2023 15:54    Review of Systems  Constitutional:  Negative for chills, diaphoresis and fever.  HENT:  Negative for ear discharge, ear pain, hearing loss and tinnitus.   Eyes:  Negative for photophobia and pain.  Respiratory:  Negative for cough and shortness of breath.   Cardiovascular:  Negative for chest pain.  Gastrointestinal:  Negative for abdominal pain, nausea and vomiting.  Genitourinary:  Negative for dysuria, flank pain, frequency and urgency.  Musculoskeletal:  Negative for arthralgias, back pain, myalgias and neck pain.  Skin:  Positive for wound (Right foot).  Neurological:  Negative for dizziness and headaches.  Hematological:  Does not bruise/bleed easily.  Psychiatric/Behavioral:  The patient is not nervous/anxious.  Blood pressure 95/64, pulse 68, temperature (!) 97.5 F (36.4 C), temperature source Oral, resp. rate 18, height 5\' 6"  (1.676 m), weight 97.5 kg, last menstrual period 08/22/2015, SpO2 99%. Physical Exam Constitutional:      General: She is not in acute distress.    Appearance: She is well-developed. She is not diaphoretic.  HENT:     Head: Normocephalic and atraumatic.  Eyes:     General: No scleral icterus.       Right eye: No discharge.        Left eye: No discharge.     Conjunctiva/sclera: Conjunctivae normal.  Cardiovascular:     Rate and Rhythm: Normal rate and regular rhythm.  Pulmonary:      Effort: Pulmonary effort is normal. No respiratory distress.  Musculoskeletal:     Cervical back: Normal range of motion.  Feet:     Comments: Right foot -- No traumatic wounds, ecchymosis, or rash  Nontender, ulceration plantar under 5th MTP or somewhat proximal, brownish discharge  No ankle effusion  Sens DPN, SPN, TN absent  Motor EHL, ext, flex, evers 5/5  DP 0, PT 0, No significant edema  Skin:    General: Skin is warm and dry.  Neurological:     Mental Status: She is alert.  Psychiatric:        Mood and Affect: Mood normal.        Behavior: Behavior normal.     Assessment/Plan: Right foot ulcer -- Will get ABI's. If abnormal will need vascular consult. Will also get MRI. Dr. Lajoyce Corners is OOT but will be back Monday. If questions arise in the meantime Dr. Christell Constant is available. It is also fine to discharge her and complete workup or surgical planning as outpatient.    Freeman Caldron, PA-C Orthopedic Surgery [phone] 08/03/2023, 9:16 AM

## 2023-08-03 NOTE — Progress Notes (Signed)
ABI's have been completed. Preliminary results can be found in CV Proc through chart review.   08/03/23 12:22 PM Olen Cordial RVT

## 2023-08-03 NOTE — Assessment & Plan Note (Signed)
Does not make urine. Volume status controlled by HD.

## 2023-08-03 NOTE — Progress Notes (Signed)
Received patient in bed.Awake,alert and oriented x 4.Consent verified.  Access used.Left upper arm AVF that worked well.  Duration of treatment 3.5 hours.  Fluid removal goal : 2 liters.  Hemo comment : Turn over to night time HD nurse.

## 2023-08-03 NOTE — Subjective & Objective (Addendum)
Pt seen and examined. MRI foot yesterday only imaged partial cuboid bone. But did suggest osteomyelitis. Complete MRI of hindfoot performed today.  Pt going to angiogram with vascular surgery later today.

## 2023-08-03 NOTE — Hospital Course (Addendum)
HPI: Amy Moses is a 52 y.o. female with medical history significant of DM2, ESRD, chronic foot infection, calciphylaxis,     Presented with  right foot wound Presents with a wound on the bottom of her foot for past 3 months which does not seem to be getting better she is diabetic no associated fevers She has history of nonhealing foot ulcers in the past as well as calciphylaxis And end-stage diabetes last hemodialysis was yesterday she has home health nurse who has been doing compression dressings to her legs but this morning the wound care nurse seen her and was concerned because it was more draining and her foot appears to be red no fevers or chills patient is diabetic neuropathy    Denies significant ETOH intake   Does not smoke   Significant Events: Admitted 08/02/2023   Significant Labs: CRP 1.7 ESR 7 Prealbumin 22  Significant Imaging Studies: ABI: Summary:  Right: Resting right ankle-brachial index indicates noncompressible right  lower extremity arteries. The right toe-brachial index is abnormal.   Left: Resting left ankle-brachial index indicates noncompressible left  lower extremity arteries.   Unable to obtain TBI due to great toe girth.  Right foot xray : MPRESSION: Extensive soft tissue swelling about the foot with some new plantar soft tissue gas. Please correlate with clinical findings. Previous surgical changes from amputation of fifth ray, fixation hardware about the ankle. Severe osteopenia with degenerative changes and posttraumatic changes. MRI right foot showed cuboid osteomyelitis. Further MRI imaging requested of right hind foot  Antibiotic Therapy: Anti-infectives (From admission, onward)    Start     Dose/Rate Route Frequency Ordered Stop   08/02/23 2315  cefTRIAXone (ROCEPHIN) 2 g in sodium chloride 0.9 % 100 mL IVPB       Placed in "And" Linked Group   2 g 200 mL/hr over 30 Minutes Intravenous Every 24 hours 08/02/23 2313 08/09/23 2314    08/02/23 2315  metroNIDAZOLE (FLAGYL) IVPB 500 mg       Placed in "And" Linked Group   500 mg 100 mL/hr over 60 Minutes Intravenous Every 12 hours 08/02/23 2313 08/09/23 2314       Procedures:   Consultants: Ortho Vascular surgery Infectious Disease

## 2023-08-03 NOTE — Progress Notes (Signed)
PROGRESS NOTE    Amy Moses  YNW:[id number] DOB: Oct 07, 1971 DOA: 08/02/2023 PCP: Lorelei Pont, DO  Subjective: Pt seen and examined. Has had right foot wound for several weeks to months. Has been both orthopedics([facility]) and vascular surgery([facility]) before. Afebrile. CRP mildly elevated at 1.7. ESR normal at 7. MRI right foot performed.   Hospital Course: HPI: Amy Moses is a 52 y.o. female with medical history significant of DM2, ESRD, chronic foot infection, calciphylaxis,     Presented with  right foot wound Presents with a wound on the bottom of her foot for past 3 months which does not seem to be getting better she is diabetic no associated fevers She has history of nonhealing foot ulcers in the past as well as calciphylaxis And end-stage diabetes last hemodialysis was yesterday she has home health nurse who has been doing compression dressings to her legs but this morning the wound care nurse seen her and was concerned because it was more draining and her foot appears to be red no fevers or chills patient is diabetic neuropathy    Denies significant ETOH intake   Does not smoke   Significant Events: Admitted 08/02/2023   Significant Labs: CRP 1.7 ESR 7 Prealbumin 22  Significant Imaging Studies: ABI: Summary:  Right: Resting right ankle-brachial index indicates noncompressible right  lower extremity arteries. The right toe-brachial index is abnormal.   Left: Resting left ankle-brachial index indicates noncompressible left  lower extremity arteries.   Unable to obtain TBI due to great toe girth.  Right foot xray : MPRESSION: Extensive soft tissue swelling about the foot with some new plantar soft tissue gas. Please correlate with clinical findings. Previous surgical changes from amputation of fifth ray, fixation hardware about the ankle. Severe osteopenia with degenerative changes and posttraumatic changes.  Antibiotic  Therapy: Anti-infectives (From admission, onward)    Start     Dose/Rate Route Frequency Ordered Stop   08/02/23 2315  cefTRIAXone (ROCEPHIN) 2 g in sodium chloride 0.9 % 100 mL IVPB       Placed in "And" Linked Group   2 g 200 mL/hr over 30 Minutes Intravenous Every 24 hours 08/02/23 2313 08/09/23 2314   08/02/23 2315  metroNIDAZOLE (FLAGYL) IVPB 500 mg       Placed in "And" Linked Group   500 mg 100 mL/hr over 60 Minutes Intravenous Every 12 hours 08/02/23 2313 08/09/23 2314       Procedures:   Consultants: Ortho Vascular surgery    Assessment and Plan: * Ischemic ulcer of right foot (HCC) Admitted for draining right foot wound. On IV rocephin/po flagyl. Ortho consulted. ABI are abnormal due to incompressible arteries. Vascular surgery consulted. MRI right foot completed. Not yet read.       OSA (obstructive sleep apnea) Continue with home CPAP.  Obesity (BMI 30-39.9) BMI 34.7  Sacral pressure ulcer Presents on admission. See wound consult note. Silicone foam for sacrum.  Anemia of chronic kidney failure, stage 5 (HCC) Baseline HgB 9-10 g/dl  Chronic diastolic heart failure (HCC) Does not make urine. Volume status controlled by HD.  ESRD (end stage renal disease) on dialysis [facility]) Nephrology consulted for routine HD. Dialyzes via left UE AVF  Acquired hypothyroidism Stable. On synthroid 75 mcg daily.  Primary hypertension Stable.  Mixed hyperlipidemia Stable. On lipitor 10 mg daily.  Type 2 diabetes mellitus with ESRD (end-stage renal disease) (HCC) Stable. On lantus and SSI.   DVT prophylaxis: SCDs Start: 08/03/23 0037  Code Status: Full Code Family Communication: no family at bedside Disposition Plan: return home Reason for continuing need for hospitalization: awaiting further diagnostic workup for foot ulcer by ortho and vascular surgery. Ongoing IV ABX need  Objective: Vitals:   08/02/23 2303 08/02/23 2322 08/03/23 0211 08/03/23 0754   BP: 103/68 (!) 101/59 93/60 95/64   Pulse: 72 74 71 68  Resp: 18 16 18    Temp:  98.5 F (36.9 C) 97.7 F (36.5 C) (!) 97.5 F (36.4 C)  TempSrc:  Oral Oral Oral  SpO2: 100% 100% 97% 99%  Weight:      Height:        Intake/Output Summary (Last 24 hours) at 08/03/2023 1412 Last data filed at 08/03/2023 1129 Gross per 24 hour  Intake 483 ml  Output --  Net 483 ml   Filed Weights   08/02/23 1430  Weight: 97.5 kg    Examination:  Physical Exam Vitals and nursing note reviewed.  Constitutional:      General: She is not in acute distress.    Appearance: She is obese. She is not toxic-appearing.     Comments: Appears chronically ill  HENT:     Head: Normocephalic and atraumatic.  Eyes:     General: No scleral icterus. Cardiovascular:     Rate and Rhythm: Normal rate and regular rhythm.  Pulmonary:     Effort: Pulmonary effort is normal.     Breath sounds: Normal breath sounds.  Abdominal:     General: Bowel sounds are normal. There is no distension.     Palpations: Abdomen is soft.  Skin:    Capillary Refill: Capillary refill takes less than 2 seconds.     Comments: See pictures of right foot wound  Neurological:     Mental Status: She is oriented to person, place, and time.          Data Reviewed: I have personally reviewed following labs and imaging studies  CBC: Recent Labs  Lab 08/02/23 1438 08/03/23 0600  WBC 7.8 6.4  NEUTROABS 6.0  --   HGB 11.4* 9.9*  HCT 36.7 32.7*  MCV 95.6 93.2  PLT 86* 75*   Basic Metabolic Panel: Recent Labs  Lab 08/02/23 1438 08/03/23 0600  NA 134* 135  K 4.1 3.8  CL 99 100  CO2 24 21*  GLUCOSE 157* 223*  BUN 37* 43*  CREATININE 6.21* 6.92*  CALCIUM 7.8* 7.8*  MG  --  2.2  PHOS  --  6.7*   GFR: Estimated Creatinine Clearance: 11.6 mL/min (A) (by C-G formula based on SCr of 6.92 mg/dL (H)). Liver Function Tests: Recent Labs  Lab 08/02/23 1435 08/02/23 1438 08/03/23 0600  AST 14* 13* 11*  ALT 15 15 13    ALKPHOS 134* 131* 117  BILITOT 1.0 0.9 0.6  PROT 7.1 7.0 6.1*  ALBUMIN 4.0 4.0 3.3*   CBG: Recent Labs  Lab 08/02/23 2246 08/03/23 0215 08/03/23 0445 08/03/23 0814 08/03/23 1153  GLUCAP 163* 201* 215* 163* 139*   Thyroid Function Tests: Recent Labs    08/02/23 1435  TSH 4.315   Anemia Panel: Recent Labs    08/02/23 1435 08/03/23 0019  VITAMINB12  --  297  FOLATE  --  8.9  FERRITIN  --  1,099*  TIBC  --  230*  IRON  --  52  RETICCTPCT 1.7  --    Sepsis Labs: Recent Labs  Lab 08/02/23 1435 08/02/23 1442 08/02/23 1850  PROCALCITON 1.58  --   --  LATICACIDVEN  --  0.8 1.0    Recent Results (from the past 240 hour(s))  Blood Cultures x 2 sites     Status: None (Preliminary result)   Collection Time: 08/02/23  6:35 PM   Specimen: BLOOD  Result Value Ref Range Status   Specimen Description BLOOD SITE NOT SPECIFIED  Final   Special Requests   Final    BOTTLES DRAWN AEROBIC AND ANAEROBIC Blood Culture results may not be optimal due to an inadequate volume of blood received in culture bottles   Culture   Final    NO GROWTH < 24 HOURS Performed at [facility], [address] [place], [place] [address]    Report Status PENDING  Incomplete  Blood Cultures x 2 sites     Status: None (Preliminary result)   Collection Time: 08/02/23  6:40 PM   Specimen: BLOOD  Result Value Ref Range Status   Specimen Description BLOOD SITE NOT SPECIFIED  Final   Special Requests   Final    BOTTLES DRAWN AEROBIC AND ANAEROBIC Blood Culture adequate volume   Culture   Final    NO GROWTH < 24 HOURS Performed at [facility], [address] [place], [place] [address]    Report Status PENDING  Incomplete  MRSA Next Gen by PCR, Nasal     Status: None   Collection Time: 08/03/23  4:40 AM   Specimen: Nasal Mucosa; Nasal Swab  Result Value Ref Range Status   MRSA by PCR Next Gen NOT DETECTED NOT DETECTED Final    Comment: (NOTE) The GeneXpert MRSA Assay (FDA approved  for NASAL specimens only), is one component of a comprehensive MRSA colonization surveillance program. It is not intended to diagnose MRSA infection nor to guide or monitor treatment for MRSA infections. Test performance is not FDA approved in patients less than 4 years old. Performed at [facility], [address] [place], [place] [address]      Radiology Studies: VAS [name] ABI WITH/WO TBI  Result Date: 08/03/2023  LOWER EXTREMITY DOPPLER STUDY Patient Name:  JOLE BRANNAM  Date of Exam:   08/03/2023 Medical Rec #: [id number]        Accession #:    [id number] Date of Birth: 09-Feb-1971       Patient Gender: F Patient Age:   79 years Exam Location:  [facility] Procedure:      VAS [name] ABI WITH/WO TBI Referring Phys: ANASTASSIA DOUTOVA --------------------------------------------------------------------------------  Indications: Ulceration. High Risk Factors: Hypertension, hyperlipidemia, Diabetes.  Limitations: Today's exam was limited due to bandages , an open wound and              Bilateral skin induration, Left restricted arm. Comparison Study: No prior studies. Performing Technologist: Olen Cordial RVT  Examination Guidelines: A complete evaluation includes at minimum, Doppler waveform signals and systolic blood pressure reading at the level of bilateral brachial, anterior tibial, and posterior tibial arteries, when vessel segments are accessible. Bilateral testing is considered an integral part of a complete examination. Photoelectric Plethysmograph (PPG) waveforms and toe systolic pressure readings are included as required and additional duplex testing as needed. Limited examinations for reoccurring indications may be performed as noted.  ABI Findings: +---------+------------------+-----+----------+--------+ Right    Rt Pressure (mmHg)IndexWaveform  Comment  +---------+------------------+-----+----------+--------+ Brachial 129                    triphasic           +---------+------------------+-----+----------+--------+  PTA      254               1.97 monophasic         +---------+------------------+-----+----------+--------+ DP       254               1.97 monophasic         +---------+------------------+-----+----------+--------+ Great Toe55                0.43                    +---------+------------------+-----+----------+--------+ +--------+------------------+-----+------------------+----------+ Left    Lt Pressure (mmHg)IndexWaveform          Comment    +--------+------------------+-----+------------------+----------+ Brachial                                         Restricted +--------+------------------+-----+------------------+----------+ PTA                            Unable to insonateInduration +--------+------------------+-----+------------------+----------+ DP      254               1.97                              +--------+------------------+-----+------------------+----------+ +-------+-----------+-----------+------------+------------+ ABI/TBIToday's ABIToday's TBIPrevious ABIPrevious TBI +-------+-----------+-----------+------------+------------+ Right  [place]         0.43                                +-------+-----------+-----------+------------+------------+ Left   [place]                                             +-------+-----------+-----------+------------+------------+  Summary: Right: Resting right ankle-brachial index indicates noncompressible right lower extremity arteries. The right toe-brachial index is abnormal. Left: Resting left ankle-brachial index indicates noncompressible left lower extremity arteries. Unable to obtain TBI due to great toe girth. *See table(s) above for measurements and observations.     Preliminary    DG Foot Complete Right  Result Date: 08/02/2023 CLINICAL DATA:  Right foot wound EXAM: RIGHT FOOT COMPLETE - 3 VIEW COMPARISON:  X-ray 05/10/2023. Images only.  There is no report available. FINDINGS: Fixation hardware seen about the distal tibia and fibula at the edge of the imaging field. There is severe osteopenia. Global soft tissue swelling about the foot with some plantar soft tissue gas at the level of the midfoot on the lateral view. Severe osteopenia with multifocal areas of bony deformity consistent with old trauma there is also hallux valgus deformity of the first ray with hypertrophic degenerative changes. 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 No definite erosive changes identified at this time. Vascular calcifications are seen. Plantar calcaneal spur. If there is further concern of bone infection, follow up MRI or bone scan may be useful for further sensitivity. IMPRESSION: Extensive soft tissue swelling about the foot with some new plantar soft tissue gas. Please correlate with clinical findings. Previous surgical changes from amputation of fifth ray, fixation hardware about the ankle. Severe osteopenia with degenerative changes and posttraumatic changes. Electronically Signed  By: Karen Kays M.D.   On: 08/02/2023 15:54    Scheduled Meds:  atorvastatin  10 mg Oral QPM   Chlorhexidine Gluconate Cloth  6 each Topical Q0600   feeding supplement (NEPRO CARB STEADY)  237 mL Oral BID BM   ferric citrate  630 mg Oral TID WC   insulin aspart  0-6 Units Subcutaneous Q4H   insulin glargine-yfgn  5 Units Subcutaneous QHS   levothyroxine  75 mcg Oral Daily   midodrine  10 mg Oral Daily   multivitamin  1 tablet Oral QHS   nutrition supplement (JUVEN)  1 packet Oral BID BM   pantoprazole  40 mg Oral BID   sodium chloride flush  3 mL Intravenous Q12H   Continuous Infusions:  sodium chloride     anticoagulant sodium citrate     cefTRIAXone (ROCEPHIN)  IV Stopped (08/03/23 0136)   And   metronidazole 500 mg (08/03/23 1131)     LOS: 0 days   Time spent: 40 minutes  Carollee Herter, DO  [facility]  08/03/2023, 2:12 PM

## 2023-08-03 NOTE — Assessment & Plan Note (Signed)
BMI 34.7

## 2023-08-03 NOTE — Progress Notes (Signed)
OT Cancellation Note  Patient Details Name: Amy Moses MRN: [id number] DOB: February 14, 1971   Cancelled Treatment:    Reason Eval/Treat Not Completed: Other (comment) (Per RN and pt staff on way to take pt to dialysis. Pt reports she will be happy to participate in formal eval tomorrow. Will follow up then.)  Tyler Deis, OTR/L [facility] Acute Rehabilitation Office: [phone]   Amy Moses 08/03/2023, 3:11 PM

## 2023-08-03 NOTE — Assessment & Plan Note (Signed)
Nephrology consulted for routine HD. Dialyzes via left UE AVF

## 2023-08-03 NOTE — Progress Notes (Signed)
   MRI right foot report reviewed. Have ordered MRI right heel to include midfoot and hindfoot. Spoke with MRI technician who will modify order if needed to accommodate instructions per radiologist.  Carollee Herter, DO [facility]

## 2023-08-03 NOTE — Progress Notes (Signed)
Pt receives out-pt HD at [facility] on TTS. Will assist as needed.   Olivia Canter Renal Navigator [phone]

## 2023-08-03 NOTE — Progress Notes (Signed)
   08/03/23 2246  BiPAP/CPAP/SIPAP  $ Non-Invasive Home Ventilator  Subsequent  BiPAP/CPAP/SIPAP Pt Type Adult  BiPAP/CPAP/SIPAP Resmed  Mask Type Nasal pillows  Mask Size Medium  PEEP 15 cmH20  Nasal massage performed Yes  CPAP/SIPAP surface wiped down Yes  Safety Check Completed by RT for Home Unit Yes, no issues noted  BiPAP/CPAP /SiPAP Vitals  Bilateral Breath Sounds Clear;Diminished

## 2023-08-03 NOTE — Progress Notes (Addendum)
PT Cancellation Note  Patient Details Name: Amy Moses MRN: [id number] DOB: 11-21-1971   Cancelled Treatment:    Reason Eval/Treat Not Completed: Patient at procedure or test/unavailable 1055  Attempted a 3rd time 1247 with pt stating she needs LB compression wraps and wound RN prior to mobility. Will defer to next date   [name] B Shivansh Hardaway 08/03/2023, 10:55 AM Merryl Hacker, PT Acute Rehabilitation Services Office: [phone]

## 2023-08-03 NOTE — Assessment & Plan Note (Signed)
Stable. On synthroid 75 mcg daily.

## 2023-08-04 ENCOUNTER — Encounter (HOSPITAL_COMMUNITY)
Admission: EM | Disposition: A | Discharge status: Home / Self Care | Payer: Self-pay | Source: Home / Self Care | Attending: Family Medicine

## 2023-08-04 ENCOUNTER — Inpatient Hospital Stay (HOSPITAL_COMMUNITY): Payer: Medicare Other

## 2023-08-04 DIAGNOSIS — E11621 Type 2 diabetes mellitus with foot ulcer: Secondary | ICD-10-CM

## 2023-08-04 DIAGNOSIS — I70234 Atherosclerosis of native arteries of right leg with ulceration of heel and midfoot: Secondary | ICD-10-CM | POA: Diagnosis not present

## 2023-08-04 DIAGNOSIS — M86171 Other acute osteomyelitis, right ankle and foot: Secondary | ICD-10-CM

## 2023-08-04 DIAGNOSIS — Z794 Long term (current) use of insulin: Secondary | ICD-10-CM

## 2023-08-04 DIAGNOSIS — E039 Hypothyroidism, unspecified: Secondary | ICD-10-CM | POA: Diagnosis not present

## 2023-08-04 DIAGNOSIS — N186 End stage renal disease: Secondary | ICD-10-CM | POA: Diagnosis not present

## 2023-08-04 DIAGNOSIS — L97519 Non-pressure chronic ulcer of other part of right foot with unspecified severity: Secondary | ICD-10-CM | POA: Diagnosis not present

## 2023-08-04 HISTORY — PX: ABDOMINAL AORTOGRAM W/LOWER EXTREMITY: CATH118223

## 2023-08-04 LAB — CBC
HCT: 35.7 % — ABNORMAL LOW (ref 36.0–46.0)
Hemoglobin: 10.9 g/dL — ABNORMAL LOW (ref 12.0–15.0)
MCH: 28.5 pg (ref 26.0–34.0)
MCHC: 30.5 g/dL (ref 30.0–36.0)
MCV: 93.5 fL (ref 80.0–100.0)
Platelets: 95 10*3/uL — ABNORMAL LOW (ref 150–400)
RBC: 3.82 MIL/uL — ABNORMAL LOW (ref 3.87–5.11)
RDW: 15.2 % (ref 11.5–15.5)
WBC: 6.7 10*3/uL (ref 4.0–10.5)
nRBC: 0 % (ref 0.0–0.2)

## 2023-08-04 LAB — HEMOGLOBIN A1C
Hgb A1c MFr Bld: 9 % — ABNORMAL HIGH (ref 4.8–5.6)
Mean Plasma Glucose: 212 mg/dL

## 2023-08-04 LAB — GLUCOSE, CAPILLARY
Glucose-Capillary: 135 mg/dL — ABNORMAL HIGH (ref 70–99)
Glucose-Capillary: 142 mg/dL — ABNORMAL HIGH (ref 70–99)
Glucose-Capillary: 157 mg/dL — ABNORMAL HIGH (ref 70–99)
Glucose-Capillary: 161 mg/dL — ABNORMAL HIGH (ref 70–99)
Glucose-Capillary: 214 mg/dL — ABNORMAL HIGH (ref 70–99)
Glucose-Capillary: 257 mg/dL — ABNORMAL HIGH (ref 70–99)

## 2023-08-04 LAB — HEPATITIS B SURFACE ANTIBODY, QUANTITATIVE: Hep B S AB Quant (Post): <3.5 m[IU]/mL — ABNORMAL LOW

## 2023-08-04 SURGERY — ABDOMINAL AORTOGRAM W/LOWER EXTREMITY
Anesthesia: LOCAL | Laterality: Right

## 2023-08-04 MED ORDER — ASPIRIN 81 MG PO TBEC
81.0000 mg | DELAYED_RELEASE_TABLET | Freq: Every day | ORAL | Status: DC
  Administered 2023-08-05 – 2023-08-07 (×3): 81 mg via ORAL
  Filled 2023-08-04 (×3): qty 1

## 2023-08-04 MED ORDER — MIDAZOLAM HCL 2 MG/2ML IJ SOLN
INTRAMUSCULAR | Status: AC
  Filled 2023-08-04: qty 2

## 2023-08-04 MED ORDER — LABETALOL HCL 5 MG/ML IV SOLN: 10.0000 mg | INTRAVENOUS | Status: DC | PRN

## 2023-08-04 MED ORDER — SODIUM CHLORIDE 0.9% FLUSH: 3.0000 mL | INTRAVENOUS | Status: DC | PRN

## 2023-08-04 MED ORDER — LIDOCAINE HCL (PF) 1 % IJ SOLN
INTRAMUSCULAR | Status: DC | PRN
  Administered 2023-08-04: 15 mL

## 2023-08-04 MED ORDER — LIDOCAINE HCL (PF) 1 % IJ SOLN
INTRAMUSCULAR | Status: AC
  Filled 2023-08-04: qty 30

## 2023-08-04 MED ORDER — MIDAZOLAM HCL 2 MG/2ML IJ SOLN
INTRAMUSCULAR | Status: DC | PRN
  Administered 2023-08-04: 1 mg via INTRAVENOUS

## 2023-08-04 MED ORDER — ACETAMINOPHEN 325 MG PO TABS: 650.0000 mg | ORAL_TABLET | ORAL | Status: DC | PRN

## 2023-08-04 MED ORDER — FENTANYL CITRATE (PF) 100 MCG/2ML IJ SOLN
INTRAMUSCULAR | Status: DC | PRN
  Administered 2023-08-04: 50 ug via INTRAVENOUS

## 2023-08-04 MED ORDER — FENTANYL CITRATE (PF) 100 MCG/2ML IJ SOLN
INTRAMUSCULAR | Status: AC
  Filled 2023-08-04: qty 2

## 2023-08-04 MED ORDER — HEPARIN (PORCINE) IN NACL 1000-0.9 UT/500ML-% IV SOLN
INTRAVENOUS | Status: DC | PRN
  Administered 2023-08-04 (×2): 500 mL

## 2023-08-04 MED ORDER — IODIXANOL 320 MG/ML IV SOLN
INTRAVENOUS | Status: DC | PRN
  Administered 2023-08-04: 35 mL

## 2023-08-04 MED ORDER — LIVING WELL WITH DIABETES BOOK
Freq: Once | Status: AC
  Filled 2023-08-04: qty 1

## 2023-08-04 MED ORDER — HYDRALAZINE HCL 20 MG/ML IJ SOLN: 5.0000 mg | INTRAMUSCULAR | Status: DC | PRN

## 2023-08-04 MED ORDER — SODIUM CHLORIDE 0.9% FLUSH
3.0000 mL | Freq: Two times a day (BID) | INTRAVENOUS | Status: DC
  Administered 2023-08-05 – 2023-08-07 (×6): 3 mL via INTRAVENOUS

## 2023-08-04 MED ORDER — SODIUM CHLORIDE 0.9 % IV SOLN: 250.0000 mL | INTRAVENOUS | Status: DC | PRN

## 2023-08-04 MED ORDER — ONDANSETRON HCL 4 MG/2ML IJ SOLN: 4.0000 mg | Freq: Four times a day (QID) | INTRAMUSCULAR | Status: DC | PRN

## 2023-08-04 SURGICAL SUPPLY — 10 items
CATH OMNI FLUSH 5F 65CM (CATHETERS) IMPLANT
COVER DOME SNAP 22 D (MISCELLANEOUS) IMPLANT
KIT MICROPUNCTURE NIT STIFF (SHEATH) IMPLANT
PROTECTION STATION PRESSURIZED (MISCELLANEOUS) ×1
SET ATX-X65L (MISCELLANEOUS) IMPLANT
SHEATH PINNACLE 5F 10CM (SHEATH) IMPLANT
SHEATH PROBE COVER 6X72 (BAG) IMPLANT
STATION PROTECTION PRESSURIZED (MISCELLANEOUS) IMPLANT
TRAY PV CATH (CUSTOM PROCEDURE TRAY) ×1 IMPLANT
WIRE BENTSON .035X145CM (WIRE) IMPLANT

## 2023-08-04 NOTE — Evaluation (Addendum)
Occupational Therapy Evaluation Patient Details Name: Amy Moses MRN: [id number] DOB: Feb 24, 1971 Today's Date: 08/04/2023   History of Present Illness Pt is a 52 y/o F admitted on 08/02/23 after presenting with c/o R foot wound that was red & draining more. Pt is being treated for ischemic ulcer of R foot. MRI shows R foot cuboid osteo. PMH: DM2, ESRD, chronic foot infection, calciphylaxis, R foot 5th ray amputation, R eye blindness, DM, glaucoma   Clinical Impression   PTA, pt lived with boyfriend and was mod I within the home with the exception of LB ADL. Upon eval, pt very interested in learning about LB AE to optimize functional independence and looking up AE to obtain for herself during session. Pt also observed with bil hand preference for PIP flexion and reports they have become more like this over a year. Despite functional use during session, pt reports that this has resulted in dropping many items including her fork. Would highly recommend OP OT/CHT (certified hand specialist) but recommending HHOT due to per case manager pt unable to receive both OP OT while receiving HH RN and pt max needs for [name] RN.       If plan is discharge home, recommend the following: Other (comment) (on pt request)    Functional Status Assessment  Patient has had a recent decline in their functional status and demonstrates the ability to make significant improvements in function in a reasonable and predictable amount of time.  Equipment Recommendations  Tub/shower bench    Recommendations for Other Services       Precautions / Restrictions Precautions Precautions: Fall Restrictions Weight Bearing Restrictions: No      Mobility Bed Mobility Overal bed mobility: Needs Assistance Bed Mobility: Sit to Supine       Sit to supine: Min assist   General bed mobility comments: to bring BLE into bed    Transfers Overall transfer level: Needs assistance Equipment used: None Transfers: Bed to  chair/wheelchair/BSC            Lateral/Scoot Transfers: Modified independent (Device/Increase time) General transfer comment: Pt with posterior lean with trunk vs anterior weight shift with good awareness of head/hips relationship but reports this is how she transfers at baseline. No LOB during bed>w/c on R or back to bed toward L      Balance   Sitting-balance support: Feet supported, No upper extremity supported, Bilateral upper extremity supported, Single extremity supported Sitting balance-Leahy Scale: Good                                     ADL either performed or assessed with clinical judgement   ADL Overall ADL's : Modified independent                                       General ADL Comments: Introduction of AE for LB ADL, self feeding (drops fork frequently)     Vision Baseline Vision/History: 3 Glaucoma;4 Cataracts (R eye blindness per pt; L eye cataract) Ability to See in Adequate Light: 1 Impaired Patient Visual Report: No change from baseline Additional Comments: vision impaired at baseline; see above; pt reports cataract procedure scheduled for the end of the month     Perception         Praxis  Pertinent Vitals/Pain Pain Assessment Pain Assessment: Faces Faces Pain Scale: Hurts a little bit Pain Location: buttocks Pain Descriptors / Indicators: Discomfort Pain Intervention(s): Monitored during session     Extremity/Trunk Assessment Upper Extremity Assessment Upper Extremity Assessment: RUE deficits/detail;LUE deficits/detail RUE Deficits / Details: Resting significant preference for digits 2-5 flexion in hook grasp with L more significant than R. Pt denies pain and during PROM able to easily extend into 0 degrees extension. Pt using without difficulty but does use in differing ways than typical. Pt with thenar/intrinsic muscular wasting noted. LUE Deficits / Details: Resting significant preference for digits  2-5 flexion (esp 3rd digit) in hook grasp with L more significant than R. Pt denies pain and during PROM able to easily extend into 0 degrees extension. Pt using without difficulty but does use in differing ways than typical. Pt with thenar/intrinsic muscular wasting noted.   Lower Extremity Assessment Lower Extremity Assessment: Defer to PT evaluation       Communication Communication Communication: No apparent difficulties   Cognition Arousal: Alert Behavior During Therapy: WFL for tasks assessed/performed Overall Cognitive Status: Within Functional Limits for tasks assessed                                       General Comments  Will follow to address hand function    Exercises     Shoulder Instructions      Home Living Family/patient expects to be discharged to:: Private residence Living Arrangements: Spouse/significant other (boyfriend) Available Help at Discharge: Family   Home Access: Ramped entrance     Home Layout: One level     Bathroom Shower/Tub:  (Pt reports tub did not state if tub shower)   Bathroom Toilet: Standard Bathroom Accessibility: Yes How Accessible: Accessible via wheelchair ("her smaller wheelchair") Home Equipment: Wheelchair - manual;Hospital bed;Other (comment) (transfer board)   Additional Comments: Has a home health nurse who changes BLE dressings 2x/week.      Prior Functioning/Environment Prior Level of Function : Independent/Modified Independent             Mobility Comments: lateral scoots to/from wheelchair; has transfer board for when surface height varies ADLs Comments: Mod I for ADL with exception of donning socks. Asking about AE at eval        OT Problem List: Decreased strength;Impaired UE functional use      OT Treatment/Interventions: Self-care/ADL training;Therapeutic exercise;DME and/or AE instruction;Modalities;Splinting;Therapeutic activities;Patient/family education    OT Goals(Current  goals can be found in the care plan section) Acute Rehab OT Goals Patient Stated Goal: Get better OT Goal Formulation: With patient Time For Goal Achievement: 08/18/23 Potential to Achieve Goals: Good  OT Frequency: Min 1X/week    Co-evaluation              AM-PAC OT "6 Clicks" Daily Activity     Outcome Measure Help from another person eating meals?: None Help from another person taking care of personal grooming?: None Help from another person toileting, which includes using toliet, bedpan, or urinal?: None Help from another person bathing (including washing, rinsing, drying)?: None Help from another person to put on and taking off regular upper body clothing?: None Help from another person to put on and taking off regular lower body clothing?: None 6 Click Score: 24   End of Session Equipment Utilized During Treatment:  (Pt personal wheelchair) Nurse Communication: Mobility status  Activity Tolerance: Patient  tolerated treatment well Patient left: in bed;with call bell/phone within reach  OT Visit Diagnosis: Other (comment);Muscle weakness (generalized) (M62.81) (Decr functional use of hand)                Time: 1610-9604 OT Time Calculation (min): 38 min Charges:  OT General Charges $OT Visit: 1 Visit OT Evaluation $OT Eval Moderate Complexity: 1 Mod OT Treatments $Self Care/Home Management : 23-37 mins   Tyler Deis, OTR/L [facility] Acute Rehabilitation Office: [phone]  Amy Moses 08/04/2023, 3:28 PM

## 2023-08-04 NOTE — Progress Notes (Addendum)
[facility] Progress Note   Subjective:  Seen in room - s/p repeat foot MRI this morning -> does show osteo. Denies CP/dyspnea. Reports HD went fine yesterday -> next is tomorrow.  Objective Vitals:   08/03/23 2143 08/04/23 0634 08/04/23 0741 08/04/23 1214  BP: 107/66 109/68 101/62 98/62  Pulse: 73 74 66 67  Resp: 18 18 16 16   Temp: 97.7 F (36.5 C) (!) 96.9 F (36.1 C) (!) 97.4 F (36.3 C) (!) 97.5 F (36.4 C)  TempSrc: Oral  Oral Oral  SpO2: 99% 100% 99% 94%  Weight:      Height:       Physical Exam General: Overweight, well appearing woman, NAD. Room air Heart: RRR; no murmur Lungs: L lung clear, R side with rales half up Abdomen: soft Extremities: BLE bandaged to knee; no thigh edema Dialysis Access: LUE AVF + bruit  Additional Objective Labs: Basic Metabolic Panel: Recent Labs  Lab 08/02/23 1438 08/03/23 0600  NA 134* 135  K 4.1 3.8  CL 99 100  CO2 24 21*  GLUCOSE 157* 223*  BUN 37* 43*  CREATININE 6.21* 6.92*  CALCIUM 7.8* 7.8*  PHOS  --  6.7*   Liver Function Tests: Recent Labs  Lab 08/02/23 1435 08/02/23 1438 08/03/23 0600  AST 14* 13* 11*  ALT 15 15 13   ALKPHOS 134* 131* 117  BILITOT 1.0 0.9 0.6  PROT 7.1 7.0 6.1*  ALBUMIN 4.0 4.0 3.3*   CBC: Recent Labs  Lab 08/02/23 1438 08/03/23 0600 08/04/23 0558  WBC 7.8 6.4 6.7  NEUTROABS 6.0  --   --   HGB 11.4* 9.9* 10.9*  HCT 36.7 32.7* 35.7*  MCV 95.6 93.2 93.5  PLT 86* 75* 95*   CBG: Recent Labs  Lab 08/03/23 1153 08/03/23 2140 08/04/23 0105 08/04/23 0740 08/04/23 1203  GLUCAP 139* 146* 257* 157* 135*   Iron Studies:  Recent Labs    08/03/23 0019  IRON 52  TIBC 230*  FERRITIN 1,099*   Studies/Results: MR HEEL RIGHT WO CONTRAST  Result Date: 08/04/2023 CLINICAL DATA:  Evaluate for osteomyelitis. EXAM: MR OF THE RIGHT HEEL WITHOUT CONTRAST TECHNIQUE: Multiplanar, multisequence MR imaging of the right ankle was performed. No intravenous contrast was  administered. COMPARISON:  Foot MRI 08/03/2023 FINDINGS: Prior ORIF of the distal fibula and distal tibia with orthopedic hardware resulting in susceptibility artifact partially obscuring the soft tissue and osseous structures. TENDONS Peroneal: Peroneal longus tendon intact. Peroneal brevis intact. Posteromedial: Posterior tibial tendon intact. Flexor hallucis longus tendon intact. Flexor digitorum longus tendon intact. Anterior: Tibialis anterior tendon intact. Extensor hallucis longus tendon intact Extensor digitorum longus tendon intact. Achilles:  Mild tendinosis of the Achilles tendon. Plantar Fascia: Intact. LIGAMENTS Lateral: Anterior talofibular ligament intact. Calcaneofibular ligament intact. Posterior talofibular ligament intact. Anterior and posterior tibiofibular ligaments intact. Medial: Deltoid ligament intact. Spring ligament intact. CARTILAGE Ankle Joint: No joint effusion. Normal ankle mortise. High-grade partial-thickness cartilage loss of the tibiotalar joint with areas of full-thickness cartilage loss. Subtalar Joints/Sinus Tarsi: Normal subtalar joints. No subtalar joint effusion. Normal sinus tarsi. Bones: No aggressive osseous lesion. No fracture or dislocation. Mild joint space narrowing throughout the midfoot. Soft tissue wound along the plantar lateral aspect of the cuboid with subcortical bone marrow edema consistent with osteomyelitis. Soft Tissue: No fluid collection or hematoma. Generalized muscle atrophy. Tarsal tunnel is normal. IMPRESSION: 1. Soft tissue wound along the plantar lateral aspect of the cuboid with subcortical bone marrow edema consistent with osteomyelitis. 2. Moderate-severe osteoarthritis  of the tibiotalar joint. Electronically Signed   By: Elige Ko M.D.   On: 08/04/2023 11:24   VAS [name] ABI WITH/WO TBI  Result Date: 08/03/2023  LOWER EXTREMITY DOPPLER STUDY Patient Name:  ZERENA DAPONTE  Date of Exam:   08/03/2023 Medical Rec #: [id number]        Accession #:     [id number] Date of Birth: 1971/03/03       Patient Gender: F Patient Age:   52 years Exam Location:  [facility] Procedure:      VAS [name] ABI WITH/WO TBI Referring Phys: ANASTASSIA DOUTOVA --------------------------------------------------------------------------------  Indications: Ulceration. High Risk Factors: Hypertension, hyperlipidemia, Diabetes.  Limitations: Today's exam was limited due to bandages , an open wound and              Bilateral skin induration, Left restricted arm. Comparison Study: No prior studies. Performing Technologist: Olen Cordial RVT  Examination Guidelines: A complete evaluation includes at minimum, Doppler waveform signals and systolic blood pressure reading at the level of bilateral brachial, anterior tibial, and posterior tibial arteries, when vessel segments are accessible. Bilateral testing is considered an integral part of a complete examination. Photoelectric Plethysmograph (PPG) waveforms and toe systolic pressure readings are included as required and additional duplex testing as needed. Limited examinations for reoccurring indications may be performed as noted.  ABI Findings: +---------+------------------+-----+----------+--------+ Right    Rt Pressure (mmHg)IndexWaveform  Comment  +---------+------------------+-----+----------+--------+ Brachial 129                    triphasic          +---------+------------------+-----+----------+--------+ PTA      254               1.97 monophasic         +---------+------------------+-----+----------+--------+ DP       254               1.97 monophasic         +---------+------------------+-----+----------+--------+ Great Toe55                0.43                    +---------+------------------+-----+----------+--------+ +--------+------------------+-----+------------------+----------+ Left    Lt Pressure (mmHg)IndexWaveform          Comment     +--------+------------------+-----+------------------+----------+ Brachial                                         Restricted +--------+------------------+-----+------------------+----------+ PTA                            Unable to insonateInduration +--------+------------------+-----+------------------+----------+ DP      254               1.97                              +--------+------------------+-----+------------------+----------+ +-------+-----------+-----------+------------+------------+ ABI/TBIToday's ABIToday's TBIPrevious ABIPrevious TBI +-------+-----------+-----------+------------+------------+ Right  [place]         0.43                                +-------+-----------+-----------+------------+------------+ Left   [place]                                             +-------+-----------+-----------+------------+------------+  Summary: Right: Resting right ankle-brachial index indicates noncompressible right lower extremity arteries. The right toe-brachial index is abnormal. Left: Resting left ankle-brachial index indicates noncompressible left lower extremity arteries. Unable to obtain TBI due to great toe girth. *See table(s) above for measurements and observations.  Electronically signed by Heath Lark on 08/03/2023 at 10:52:38 PM.    Final    MR FOOT RIGHT WO CONTRAST  Result Date: 08/03/2023 CLINICAL DATA:  Diabetic with nonhealing foot wound. Soft tissue infection suspected. Previous 5th ray amputation. EXAM: MRI OF THE RIGHT FOREFOOT WITHOUT CONTRAST TECHNIQUE: Multiplanar, multisequence MR imaging of the right forefoot was performed. No intravenous contrast was administered. COMPARISON:  Radiographs 08/02/2023 and 05/10/2023. Previous MRI of the right foot and lower leg 05/02/2022. FINDINGS: Bones/Joint/Cartilage Today's examination was performed with a small field-of-view and concentrates on the forefoot. The midfoot and hindfoot are incompletely visualized.  Previous amputation of the 5th ray. No acute osseous findings or evidence of osteomyelitis within the forefoot. There are old healed fractures of the 2nd and 3rd metatarsal shafts. There are advanced degenerative changes at the 1st metatarsophalangeal joint with a moderate hallux valgus deformity. No significant joint effusions. The alignment appears normal at the Lisfranc joint. Short axis axial and sagittal images partially image the cuboid. As seen on the previous MRI, there is marrow T2 hyperintensity along the inferolateral aspect of the cuboid adjacent to an overlying skin defect, suspicious for osteomyelitis. This is incompletely visualized on this examination. Ligaments Intact Lisfranc ligament. The collateral ligaments of the metatarsophalangeal joints appear intact. Muscles and Tendons Generalized forefoot muscular atrophy. The forefoot tendons appear intact, without significant tenosynovitis. Soft tissues Heterogeneous fat saturation secondary to the postsurgical changes in the distal lower leg. There is diffuse dorsal subcutaneous edema without focal fluid collection. As described above, there is a persistent soft tissue ulcer along the inferolateral aspect of the cuboid, incompletely visualized on this examination of the forefoot. IMPRESSION: 1. Persistent soft tissue ulcer along the inferolateral aspect of the cuboid with underlying marrow changes in the cuboid, suspicious for osteomyelitis. This is incompletely visualized on this examination of the forefoot. Recommend follow-up imaging of the hindfoot and midfoot. 2. No evidence of osteomyelitis within the forefoot. 3. Advanced degenerative changes at the 1st metatarsophalangeal joint with a moderate hallux valgus deformity. 4. Diffuse dorsal subcutaneous edema without focal fluid collection. 5. Old healed fractures of the 2nd and 3rd metatarsal shafts. Previous amputation of the 5th ray. Electronically Signed   By: Carey Bullocks M.D.   On:  08/03/2023 14:51   DG Foot Complete Right  Result Date: 08/02/2023 CLINICAL DATA:  Right foot wound EXAM: RIGHT FOOT COMPLETE - 3 VIEW COMPARISON:  X-ray 05/10/2023. Images only. There is no report available. FINDINGS: Fixation hardware seen about the distal tibia and fibula at the edge of the imaging field. There is severe osteopenia. Global soft tissue swelling about the foot with some plantar soft tissue gas at the level of the midfoot on the lateral view. Severe osteopenia with multifocal areas of bony deformity consistent with old trauma there is also hallux valgus deformity of the first ray with hypertrophic degenerative changes. 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 No definite erosive changes identified at this time. Vascular calcifications are seen. Plantar calcaneal spur. If there is further concern of bone infection, follow up MRI or bone scan may be useful for further sensitivity. IMPRESSION: Extensive soft tissue swelling about the foot with some new plantar soft  tissue gas. Please correlate with clinical findings. Previous surgical changes from amputation of fifth ray, fixation hardware about the ankle. Severe osteopenia with degenerative changes and posttraumatic changes. Electronically Signed   By: Karen Kays M.D.   On: 08/02/2023 15:54   Medications:  sodium chloride     cefTRIAXone (ROCEPHIN)  IV 2 g (08/04/23 0014)   And   metronidazole 500 mg (08/04/23 1207)    atorvastatin  10 mg Oral QPM   Chlorhexidine Gluconate Cloth  6 each Topical Q0600   feeding supplement (NEPRO CARB STEADY)  237 mL Oral BID BM   ferric citrate  630 mg Oral TID WC   insulin aspart  0-6 Units Subcutaneous Q4H   insulin glargine-yfgn  5 Units Subcutaneous QHS   levothyroxine  75 mcg Oral Daily   midodrine  10 mg Oral Daily   multivitamin  1 tablet Oral QHS   nutrition supplement (JUVEN)  1 packet Oral BID BM   pantoprazole  40 mg Oral BID   sodium chloride  flush  3 mL Intravenous Q12H    Dialysis Orders: [name]  3:45hr, 400/800, EDW 92.5kg, 2K/2.5Ca, AVF, no heparin - Calcitriol 0.27mcg PO q HD - Sensipar 90mg  PO q HD - Mircera IV q 2 weeks - Venofer 100mg  x 10 (in middle of course)   Assessment/Plan:  R foot wound/osteomyelitis: On Ceftriaxone + Flagyl at this time. Ortho consult pending. [facility] consulted - for repeat LE angiography studies today. Hx calciphylaxis: Reports no new wounds recently except for her foot which she reports is due to DM, no longer on sodium thiosulfate ESRD:  TTS schedule -> next HD tomorrow. No heparin (recent GI bleed). Hypotension/volume: BP soft, takes midodrine pre-HD. Chronic LE edema. Rales in R lung - asymptomatic. Will get imaging if still present after next HD  Anemia: Hgb 10.9, awaiting outpatient records.  Metabolic bone disease: Phos 6.7, continue binders. Calcium slightly low Hx calciphylaxis - do not supplement.  Nutrition: Alb 3.3, will need renal diet/protein supplement and fluid restriction T2DM: On insulin, per admitting team  Ozzie Hoyle, PA-C 08/04/2023, 1:03 PM  [facility]

## 2023-08-04 NOTE — Progress Notes (Signed)
PROGRESS NOTE    Amy Moses  YNW:[id number] DOB: Sep 04, 1971 DOA: 08/02/2023 PCP: Amy Pont, DO  Subjective: Pt seen and examined. MRI foot yesterday only imaged partial cuboid bone. But did suggest osteomyelitis. Complete MRI of hindfoot performed today.  Pt going to angiogram with vascular surgery later today.   Hospital Course: HPI: Amy Moses is a 52 y.o. female with medical history significant of DM2, ESRD, chronic foot infection, calciphylaxis,     Presented with  right foot wound Presents with a wound on the bottom of her foot for past 3 months which does not seem to be getting better she is diabetic no associated fevers She has history of nonhealing foot ulcers in the past as well as calciphylaxis And end-stage diabetes last hemodialysis was yesterday she has home health nurse who has been doing compression dressings to her legs but this morning the wound care nurse seen her and was concerned because it was more draining and her foot appears to be red no fevers or chills patient is diabetic neuropathy    Denies significant ETOH intake   Does not smoke   Significant Events: Admitted 08/02/2023   Significant Labs: CRP 1.7 ESR 7 Prealbumin 22  Significant Imaging Studies: ABI: Summary:  Right: Resting right ankle-brachial index indicates noncompressible right  lower extremity arteries. The right toe-brachial index is abnormal.   Left: Resting left ankle-brachial index indicates noncompressible left  lower extremity arteries.   Unable to obtain TBI due to great toe girth.  Right foot xray : MPRESSION: Extensive soft tissue swelling about the foot with some new plantar soft tissue gas. Please correlate with clinical findings. Previous surgical changes from amputation of fifth ray, fixation hardware about the ankle. Severe osteopenia with degenerative changes and posttraumatic changes.  Antibiotic Therapy: Anti-infectives (From admission, onward)    Start      Dose/Rate Route Frequency Ordered Stop   08/02/23 2315  cefTRIAXone (ROCEPHIN) 2 g in sodium chloride 0.9 % 100 mL IVPB       Placed in "And" Linked Group   2 g 200 mL/hr over 30 Minutes Intravenous Every 24 hours 08/02/23 2313 08/09/23 2314   08/02/23 2315  metroNIDAZOLE (FLAGYL) IVPB 500 mg       Placed in "And" Linked Group   500 mg 100 mL/hr over 60 Minutes Intravenous Every 12 hours 08/02/23 2313 08/09/23 2314       Procedures:   Consultants: Ortho Vascular surgery    Assessment and Plan: * Ischemic ulcer of right foot (HCC) Admitted for draining right foot wound. On IV rocephin/po flagyl. Ortho consulted. ABI are abnormal due to incompressible arteries. Vascular surgery consulted. MRI right foot shows cuboid osteo. Further MRI right hindfoot completed as recommended by radiologist. Not yet read.       Acute osteomyelitis of right foot (HCC) Undergoing further workup with MRI right hindfoot. Awaiting to hear back from ortho. Vascular surgery planning angiogram today. Will consult ID for abx management and further diagnostic recommendations(??bone biopsy needed??)  OSA (obstructive sleep apnea) Continue with home CPAP.  Obesity (BMI 30-39.9) BMI 34.7  Sacral pressure ulcer Presents on admission. See wound consult note. Silicone foam for sacrum.  Anemia of chronic kidney failure, stage 5 (HCC) Baseline HgB 9-10 g/dl  Chronic diastolic heart failure (HCC) Does not make urine. Volume status controlled by HD.  ESRD (end stage renal disease) on dialysis [facility]) Nephrology consulted for routine HD. Dialyzes via left UE AVF  Acquired hypothyroidism Stable. On synthroid 75 mcg  Anemia Panel: Recent Labs    08/02/23 1435 08/03/23 0019  VITAMINB12  --  297  FOLATE  --  8.9  FERRITIN  --  1,099*  TIBC  --  230*  IRON  --  52  RETICCTPCT 1.7  --    Sepsis Labs: Recent Labs  Lab 08/02/23 1435 08/02/23 1442 08/02/23 1850  PROCALCITON 1.58  --   --   LATICACIDVEN  --  0.8 1.0    Recent Results (from the past 240 hour(s))  Blood Cultures x 2 sites     Status: None (Preliminary result)   Collection Time: 08/02/23  6:35 PM   Specimen: BLOOD  Result Value Ref Range Status   Specimen Description BLOOD SITE NOT SPECIFIED  Final   Special Requests   Final    BOTTLES DRAWN AEROBIC AND ANAEROBIC Blood Culture results may not be optimal due to an inadequate volume of blood received in culture bottles   Culture   Final    NO GROWTH 2 DAYS Performed at [facility], [address] [place], [place] [address]    Report Status PENDING  Incomplete  Blood Cultures x 2 sites     Status: None (Preliminary result)   Collection Time: 08/02/23  6:40 PM   Specimen: BLOOD  Result Value Ref Range Status   Specimen Description BLOOD SITE NOT SPECIFIED  Final   Special Requests   Final    BOTTLES DRAWN AEROBIC AND ANAEROBIC Blood Culture adequate volume   Culture    Final    NO GROWTH 2 DAYS Performed at [facility], [address] [place], [place] [address]    Report Status PENDING  Incomplete  MRSA Next Gen by PCR, Nasal     Status: None   Collection Time: 08/03/23  4:40 AM   Specimen: Nasal Mucosa; Nasal Swab  Result Value Ref Range Status   MRSA by PCR Next Gen NOT DETECTED NOT DETECTED Final    Comment: (NOTE) The GeneXpert MRSA Assay (FDA approved for NASAL specimens only), is one component of a comprehensive MRSA colonization surveillance program. It is not intended to diagnose MRSA infection nor to guide or monitor treatment for MRSA infections. Test performance is not FDA approved in patients less than 17 years old. Performed at [facility], [address] [place], [place] [address]      Radiology Studies: VAS [name] ABI WITH/WO TBI  Result Date: 08/03/2023  LOWER EXTREMITY DOPPLER STUDY Patient Name:  Amy Moses  Date of Exam:   08/03/2023 Medical Rec #: [id number]        Accession #:    [id number] Date of Birth: 1971/07/07       Patient Gender: F Patient Age:   52 years Exam Location:  [facility] Procedure:      VAS [name] ABI WITH/WO TBI Referring Phys: ANASTASSIA DOUTOVA --------------------------------------------------------------------------------  Indications: Ulceration. High Risk Factors: Hypertension, hyperlipidemia, Diabetes.  Limitations: Today's exam was limited due to bandages , an open wound and              Bilateral skin induration, Left restricted arm. Comparison Study: No prior studies. Performing Technologist: Olen Cordial RVT  Examination Guidelines: A complete evaluation includes at minimum, Doppler waveform signals and systolic blood pressure reading at the level of bilateral brachial, anterior tibial, and posterior tibial arteries, when vessel segments are accessible. Bilateral testing is considered an integral part of a complete examination. Photoelectric Plethysmograph (PPG) waveforms and toe  PROGRESS NOTE    Amy Moses  YNW:[id number] DOB: Sep 04, 1971 DOA: 08/02/2023 PCP: Amy Pont, DO  Subjective: Pt seen and examined. MRI foot yesterday only imaged partial cuboid bone. But did suggest osteomyelitis. Complete MRI of hindfoot performed today.  Pt going to angiogram with vascular surgery later today.   Hospital Course: HPI: Amy Moses is a 52 y.o. female with medical history significant of DM2, ESRD, chronic foot infection, calciphylaxis,     Presented with  right foot wound Presents with a wound on the bottom of her foot for past 3 months which does not seem to be getting better she is diabetic no associated fevers She has history of nonhealing foot ulcers in the past as well as calciphylaxis And end-stage diabetes last hemodialysis was yesterday she has home health nurse who has been doing compression dressings to her legs but this morning the wound care nurse seen her and was concerned because it was more draining and her foot appears to be red no fevers or chills patient is diabetic neuropathy    Denies significant ETOH intake   Does not smoke   Significant Events: Admitted 08/02/2023   Significant Labs: CRP 1.7 ESR 7 Prealbumin 22  Significant Imaging Studies: ABI: Summary:  Right: Resting right ankle-brachial index indicates noncompressible right  lower extremity arteries. The right toe-brachial index is abnormal.   Left: Resting left ankle-brachial index indicates noncompressible left  lower extremity arteries.   Unable to obtain TBI due to great toe girth.  Right foot xray : MPRESSION: Extensive soft tissue swelling about the foot with some new plantar soft tissue gas. Please correlate with clinical findings. Previous surgical changes from amputation of fifth ray, fixation hardware about the ankle. Severe osteopenia with degenerative changes and posttraumatic changes.  Antibiotic Therapy: Anti-infectives (From admission, onward)    Start      Dose/Rate Route Frequency Ordered Stop   08/02/23 2315  cefTRIAXone (ROCEPHIN) 2 g in sodium chloride 0.9 % 100 mL IVPB       Placed in "And" Linked Group   2 g 200 mL/hr over 30 Minutes Intravenous Every 24 hours 08/02/23 2313 08/09/23 2314   08/02/23 2315  metroNIDAZOLE (FLAGYL) IVPB 500 mg       Placed in "And" Linked Group   500 mg 100 mL/hr over 60 Minutes Intravenous Every 12 hours 08/02/23 2313 08/09/23 2314       Procedures:   Consultants: Ortho Vascular surgery    Assessment and Plan: * Ischemic ulcer of right foot (HCC) Admitted for draining right foot wound. On IV rocephin/po flagyl. Ortho consulted. ABI are abnormal due to incompressible arteries. Vascular surgery consulted. MRI right foot shows cuboid osteo. Further MRI right hindfoot completed as recommended by radiologist. Not yet read.       Acute osteomyelitis of right foot (HCC) Undergoing further workup with MRI right hindfoot. Awaiting to hear back from ortho. Vascular surgery planning angiogram today. Will consult ID for abx management and further diagnostic recommendations(??bone biopsy needed??)  OSA (obstructive sleep apnea) Continue with home CPAP.  Obesity (BMI 30-39.9) BMI 34.7  Sacral pressure ulcer Presents on admission. See wound consult note. Silicone foam for sacrum.  Anemia of chronic kidney failure, stage 5 (HCC) Baseline HgB 9-10 g/dl  Chronic diastolic heart failure (HCC) Does not make urine. Volume status controlled by HD.  ESRD (end stage renal disease) on dialysis [facility] [place]) Nephrology consulted for routine HD. Dialyzes via left UE AVF  Acquired hypothyroidism Stable. On synthroid 75 mcg  PROGRESS NOTE    Amy Moses  YNW:[id number] DOB: Sep 04, 1971 DOA: 08/02/2023 PCP: Amy Pont, DO  Subjective: Pt seen and examined. MRI foot yesterday only imaged partial cuboid bone. But did suggest osteomyelitis. Complete MRI of hindfoot performed today.  Pt going to angiogram with vascular surgery later today.   Hospital Course: HPI: Amy Moses is a 52 y.o. female with medical history significant of DM2, ESRD, chronic foot infection, calciphylaxis,     Presented with  right foot wound Presents with a wound on the bottom of her foot for past 3 months which does not seem to be getting better she is diabetic no associated fevers She has history of nonhealing foot ulcers in the past as well as calciphylaxis And end-stage diabetes last hemodialysis was yesterday she has home health nurse who has been doing compression dressings to her legs but this morning the wound care nurse seen her and was concerned because it was more draining and her foot appears to be red no fevers or chills patient is diabetic neuropathy    Denies significant ETOH intake   Does not smoke   Significant Events: Admitted 08/02/2023   Significant Labs: CRP 1.7 ESR 7 Prealbumin 22  Significant Imaging Studies: ABI: Summary:  Right: Resting right ankle-brachial index indicates noncompressible right  lower extremity arteries. The right toe-brachial index is abnormal.   Left: Resting left ankle-brachial index indicates noncompressible left  lower extremity arteries.   Unable to obtain TBI due to great toe girth.  Right foot xray : MPRESSION: Extensive soft tissue swelling about the foot with some new plantar soft tissue gas. Please correlate with clinical findings. Previous surgical changes from amputation of fifth ray, fixation hardware about the ankle. Severe osteopenia with degenerative changes and posttraumatic changes.  Antibiotic Therapy: Anti-infectives (From admission, onward)    Start      Dose/Rate Route Frequency Ordered Stop   08/02/23 2315  cefTRIAXone (ROCEPHIN) 2 g in sodium chloride 0.9 % 100 mL IVPB       Placed in "And" Linked Group   2 g 200 mL/hr over 30 Minutes Intravenous Every 24 hours 08/02/23 2313 08/09/23 2314   08/02/23 2315  metroNIDAZOLE (FLAGYL) IVPB 500 mg       Placed in "And" Linked Group   500 mg 100 mL/hr over 60 Minutes Intravenous Every 12 hours 08/02/23 2313 08/09/23 2314       Procedures:   Consultants: Ortho Vascular surgery    Assessment and Plan: * Ischemic ulcer of right foot (HCC) Admitted for draining right foot wound. On IV rocephin/po flagyl. Ortho consulted. ABI are abnormal due to incompressible arteries. Vascular surgery consulted. MRI right foot shows cuboid osteo. Further MRI right hindfoot completed as recommended by radiologist. Not yet read.       Acute osteomyelitis of right foot (HCC) Undergoing further workup with MRI right hindfoot. Awaiting to hear back from ortho. Vascular surgery planning angiogram today. Will consult ID for abx management and further diagnostic recommendations(??bone biopsy needed??)  OSA (obstructive sleep apnea) Continue with home CPAP.  Obesity (BMI 30-39.9) BMI 34.7  Sacral pressure ulcer Presents on admission. See wound consult note. Silicone foam for sacrum.  Anemia of chronic kidney failure, stage 5 (HCC) Baseline HgB 9-10 g/dl  Chronic diastolic heart failure (HCC) Does not make urine. Volume status controlled by HD.  ESRD (end stage renal disease) on dialysis [facility] [place]) Nephrology consulted for routine HD. Dialyzes via left UE AVF  Acquired hypothyroidism Stable. On synthroid 75 mcg  PROGRESS NOTE    Amy Moses  YNW:[id number] DOB: Sep 04, 1971 DOA: 08/02/2023 PCP: Amy Pont, DO  Subjective: Pt seen and examined. MRI foot yesterday only imaged partial cuboid bone. But did suggest osteomyelitis. Complete MRI of hindfoot performed today.  Pt going to angiogram with vascular surgery later today.   Hospital Course: HPI: Amy Moses is a 52 y.o. female with medical history significant of DM2, ESRD, chronic foot infection, calciphylaxis,     Presented with  right foot wound Presents with a wound on the bottom of her foot for past 3 months which does not seem to be getting better she is diabetic no associated fevers She has history of nonhealing foot ulcers in the past as well as calciphylaxis And end-stage diabetes last hemodialysis was yesterday she has home health nurse who has been doing compression dressings to her legs but this morning the wound care nurse seen her and was concerned because it was more draining and her foot appears to be red no fevers or chills patient is diabetic neuropathy    Denies significant ETOH intake   Does not smoke   Significant Events: Admitted 08/02/2023   Significant Labs: CRP 1.7 ESR 7 Prealbumin 22  Significant Imaging Studies: ABI: Summary:  Right: Resting right ankle-brachial index indicates noncompressible right  lower extremity arteries. The right toe-brachial index is abnormal.   Left: Resting left ankle-brachial index indicates noncompressible left  lower extremity arteries.   Unable to obtain TBI due to great toe girth.  Right foot xray : MPRESSION: Extensive soft tissue swelling about the foot with some new plantar soft tissue gas. Please correlate with clinical findings. Previous surgical changes from amputation of fifth ray, fixation hardware about the ankle. Severe osteopenia with degenerative changes and posttraumatic changes.  Antibiotic Therapy: Anti-infectives (From admission, onward)    Start      Dose/Rate Route Frequency Ordered Stop   08/02/23 2315  cefTRIAXone (ROCEPHIN) 2 g in sodium chloride 0.9 % 100 mL IVPB       Placed in "And" Linked Group   2 g 200 mL/hr over 30 Minutes Intravenous Every 24 hours 08/02/23 2313 08/09/23 2314   08/02/23 2315  metroNIDAZOLE (FLAGYL) IVPB 500 mg       Placed in "And" Linked Group   500 mg 100 mL/hr over 60 Minutes Intravenous Every 12 hours 08/02/23 2313 08/09/23 2314       Procedures:   Consultants: Ortho Vascular surgery    Assessment and Plan: * Ischemic ulcer of right foot (HCC) Admitted for draining right foot wound. On IV rocephin/po flagyl. Ortho consulted. ABI are abnormal due to incompressible arteries. Vascular surgery consulted. MRI right foot shows cuboid osteo. Further MRI right hindfoot completed as recommended by radiologist. Not yet read.       Acute osteomyelitis of right foot (HCC) Undergoing further workup with MRI right hindfoot. Awaiting to hear back from ortho. Vascular surgery planning angiogram today. Will consult ID for abx management and further diagnostic recommendations(??bone biopsy needed??)  OSA (obstructive sleep apnea) Continue with home CPAP.  Obesity (BMI 30-39.9) BMI 34.7  Sacral pressure ulcer Presents on admission. See wound consult note. Silicone foam for sacrum.  Anemia of chronic kidney failure, stage 5 (HCC) Baseline HgB 9-10 g/dl  Chronic diastolic heart failure (HCC) Does not make urine. Volume status controlled by HD.  ESRD (end stage renal disease) on dialysis [facility] [place]) Nephrology consulted for routine HD. Dialyzes via left UE AVF  Acquired hypothyroidism Stable. On synthroid 75 mcg  Anemia Panel: Recent Labs    08/02/23 1435 08/03/23 0019  VITAMINB12  --  297  FOLATE  --  8.9  FERRITIN  --  1,099*  TIBC  --  230*  IRON  --  52  RETICCTPCT 1.7  --    Sepsis Labs: Recent Labs  Lab 08/02/23 1435 08/02/23 1442 08/02/23 1850  PROCALCITON 1.58  --   --   LATICACIDVEN  --  0.8 1.0    Recent Results (from the past 240 hour(s))  Blood Cultures x 2 sites     Status: None (Preliminary result)   Collection Time: 08/02/23  6:35 PM   Specimen: BLOOD  Result Value Ref Range Status   Specimen Description BLOOD SITE NOT SPECIFIED  Final   Special Requests   Final    BOTTLES DRAWN AEROBIC AND ANAEROBIC Blood Culture results may not be optimal due to an inadequate volume of blood received in culture bottles   Culture   Final    NO GROWTH 2 DAYS Performed at [facility], [address] [place], [place] [address]    Report Status PENDING  Incomplete  Blood Cultures x 2 sites     Status: None (Preliminary result)   Collection Time: 08/02/23  6:40 PM   Specimen: BLOOD  Result Value Ref Range Status   Specimen Description BLOOD SITE NOT SPECIFIED  Final   Special Requests   Final    BOTTLES DRAWN AEROBIC AND ANAEROBIC Blood Culture adequate volume   Culture    Final    NO GROWTH 2 DAYS Performed at [facility], [address] [place], [place] [address]    Report Status PENDING  Incomplete  MRSA Next Gen by PCR, Nasal     Status: None   Collection Time: 08/03/23  4:40 AM   Specimen: Nasal Mucosa; Nasal Swab  Result Value Ref Range Status   MRSA by PCR Next Gen NOT DETECTED NOT DETECTED Final    Comment: (NOTE) The GeneXpert MRSA Assay (FDA approved for NASAL specimens only), is one component of a comprehensive MRSA colonization surveillance program. It is not intended to diagnose MRSA infection nor to guide or monitor treatment for MRSA infections. Test performance is not FDA approved in patients less than 17 years old. Performed at [facility], [address] [place], [place] [address]      Radiology Studies: VAS [name] ABI WITH/WO TBI  Result Date: 08/03/2023  LOWER EXTREMITY DOPPLER STUDY Patient Name:  Amy Moses  Date of Exam:   08/03/2023 Medical Rec #: [id number]        Accession #:    [id number] Date of Birth: 1971/07/07       Patient Gender: F Patient Age:   52 years Exam Location:  [facility] Procedure:      VAS [name] ABI WITH/WO TBI Referring Phys: ANASTASSIA DOUTOVA --------------------------------------------------------------------------------  Indications: Ulceration. High Risk Factors: Hypertension, hyperlipidemia, Diabetes.  Limitations: Today's exam was limited due to bandages , an open wound and              Bilateral skin induration, Left restricted arm. Comparison Study: No prior studies. Performing Technologist: Olen Cordial RVT  Examination Guidelines: A complete evaluation includes at minimum, Doppler waveform signals and systolic blood pressure reading at the level of bilateral brachial, anterior tibial, and posterior tibial arteries, when vessel segments are accessible. Bilateral testing is considered an integral part of a complete examination. Photoelectric Plethysmograph (PPG) waveforms and toe

## 2023-08-04 NOTE — Evaluation (Signed)
Physical Therapy Brief Evaluation and Discharge Note Patient Details Name: Amy Moses MRN: [id number] DOB: 16-Sep-1971 Today's Date: 08/04/2023   History of Present Illness  Pt is a 52 y/o F admitted on 08/02/23 after presenting with c/o R foot wound that was red & draining more. Pt is being treated for ischemic ulcer of R foot. MRI shows R foot cuboid osteo. 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 Pt reports prior to admission she was mod I from w/c level, home alone during the day. On this date, pt is able to complete bed mobility with mod I, lateral scoot bed>w/c on R with mod I. Pt also propels w/c to sink without assistance. Pt appears to be at baseline & pt confirms this. Pt does not require acute PT services at this time, please re-consult if new needs arise.        PT Assessment Patient does not need any further PT services  Assistance Needed at Discharge  PRN    Equipment Recommendations None recommended by PT  Recommendations for Other Services       Precautions/Restrictions Precautions Precautions: Fall Restrictions Weight Bearing Restrictions: No        Mobility  Bed Mobility   Supine/Sidelying to sit: Modified independent (Device/Increased time), HOB elevated, Used rails      Transfers Overall transfer level: Needs assistance Equipment used: None Transfers: Bed to chair/wheelchair/BSC            Lateral/Scoot Transfers: Modified independent (Device/Increase time) General transfer comment: Pt with posterior lean with trunk vs anterior weight shift with good awareness of head/hips relationship but reports this is how she transfers at baseline. No LOB during bed>w/c on R.    Ambulation/Gait              Home Activity Instructions    Stairs            Modified Rankin (Stroke Patients Only)         Balance   Sitting-balance support: Feet supported, No upper extremity supported, Bilateral upper extremity supported, Single extremity supported Sitting balance-Leahy Scale: Good                    Pertinent Vitals/Pain   Pain Assessment Pain Assessment: Faces Faces Pain Scale: Hurts a little bit Pain Location: buttocks Pain Descriptors / Indicators: Discomfort Pain Intervention(s): Monitored during session, Repositioned     Home Living Family/patient expects to be discharged to:: Private residence Living Arrangements: Spouse/significant other (boyfriend) Available Help at Discharge: Family;Available PRN/intermittently Home Environment: Ramped entrance (2 level but lives on main floor only)   Home Equipment: Wheelchair - manual;Hospital bed   Additional Comments: Has a home health nurse who changes BLE dressings 2x/week.    Prior Function Level of Independence: Independent with assistive device(s) Comments: Pt reports she's mod I from w/c level, transfers to w/c primarily without slide board but will use it on occasion.    UE/LE Assessment   UE ROM/Strength/Tone/Coordination: Generalized weakness (but able to use to functionally transfer)    LE ROM/Strength/Tone/Coordination: Generalized weakness (but able to use to functionally transfer)      Communication   Communication Communication: No apparent difficulties     Cognition Overall Cognitive Status: Appears within functional limits for tasks assessed/performed       General Comments      Exercises  Assessment/Plan    PT Problem List         PT Visit Diagnosis      No Skilled PT Patient at baseline level of functioning   Co-evaluation                AMPAC 6 Clicks Help needed turning from your back to your side while in a flat bed without using bedrails?: None Help needed moving from lying on your back to sitting on the side of a flat bed without using bedrails?:  None Help needed moving to and from a bed to a chair (including a wheelchair)?: None Help needed standing up from a chair using your arms (e.g., wheelchair or bedside chair)?: A Lot Help needed to walk in hospital room?: Total Help needed climbing 3-5 steps with a railing? : Total 6 Click Score: 16      End of Session   Activity Tolerance: Patient tolerated treatment well Patient left:  (in w/c in handoff to OT)         Time: 9562-1308 PT Time Calculation (min) (ACUTE ONLY): 17 min  Charges:   PT Evaluation $PT Eval Low Complexity: 1 Low      Aleda Grana, PT, DPT 08/04/23, 12:48 PM   Sandi Mariscal  08/04/2023, 12:47 PM

## 2023-08-04 NOTE — Assessment & Plan Note (Addendum)
Undergoing further workup with MRI right hindfoot. Awaiting to hear back from ortho. Vascular surgery planning angiogram today. Will consult ID for abx management and further diagnostic recommendations(??bone biopsy needed??)

## 2023-08-04 NOTE — Plan of Care (Signed)

## 2023-08-04 NOTE — Progress Notes (Signed)
   Discussion with ID. MRI hindfoot shows osteomyelitis of cuboid bone. But ESR is normal.  Pt with adequate arterial flow on angiogram per vascular.  Question of whether MRI changes are due to charcot joint will need to be addressed by orthopedics.  For now, continued IV ABX will be held per discussion with ID.  Carollee Herter, DO [facility]

## 2023-08-04 NOTE — Progress Notes (Signed)
[facility] [place] PROGRESS NOTE  ASSESSMENT / PLAN: Amy Moses is a 52 y.o. female with atherosclerosis of native arteries of right lower extremity causing ulceration.  Recommend:  Abstinence from all tobacco products. Blood glucose control with goal A1c < 7%. Blood pressure control with goal blood pressure < 140/90 mmHg. Lipid reduction therapy with goal LDL-C <100 mg/dL  Aspirin 81mg  PO QD.  Atorvastatin 40-80mg  PO QD (or other "high intensity" statin therapy).  Angiogram today.    SUBJECTIVE: No complaints. Ready for procedure.  OBJECTIVE: BP 98/62 (BP Location: Right Arm)   Pulse 67   Temp (!) 97.5 F (36.4 C) (Oral)   Resp 16   Ht 5\' 6"  (1.676 m)   Wt 91.7 kg   LMP 08/22/2015 (Approximate)   SpO2 100%   BMI 32.63 kg/m   Intake/Output Summary (Last 24 hours) at 08/04/2023 1431 Last data filed at 08/03/2023 2252 Gross per 24 hour  Intake 120 ml  Output 2 ml  Net 118 ml            Latest Ref Rng & Units 08/04/2023    5:58 AM 08/03/2023    6:00 AM 08/02/2023    2:38 PM  CBC  WBC 4.0 - 10.5 K/uL 6.7  6.4  7.8   Hemoglobin 12.0 - 15.0 g/dL 40.9  9.9  81.1   Hematocrit 36.0 - 46.0 % 35.7  32.7  36.7   Platelets 150 - 400 K/uL 95  75  86         Latest Ref Rng & Units 08/03/2023    6:00 AM 08/02/2023    2:38 PM 08/02/2023    2:35 PM  CMP  Glucose 70 - 99 mg/dL 914  782    BUN 6 - 20 mg/dL 43  37    Creatinine 9.56 - 1.00 mg/dL 2.13  0.86    Sodium 578 - 145 mmol/L 135  134    Potassium 3.5 - 5.1 mmol/L 3.8  4.1    Chloride 98 - 111 mmol/L 100  99    CO2 22 - 32 mmol/L 21  24    Calcium 8.9 - 10.3 mg/dL 7.8  7.8    Total Protein 6.5 - 8.1 g/dL 6.1  7.0  7.1   Total Bilirubin 0.3 - 1.2 mg/dL 0.6  0.9  1.0   Alkaline Phos 38 - 126 U/L 117  131  134   AST 15 - 41 U/L 11  13  14    ALT 0 - 44 U/L 13  15  15      Estimated Creatinine Clearance: 11.2 mL/min (A) (by C-G formula based on SCr of 6.92 mg/dL (H)).  Rande Brunt.  Lenell Antu, MD [facility] Phone Number: [phone] 08/04/2023 2:31 PM

## 2023-08-04 NOTE — Consult Note (Signed)
[facility]    Date of Admission:  08/02/2023     Reason for Consult: chronic diabetic foot ulcer    Referring Provider: Carollee Herter     Lines:  Av fistula  Abx: 9/11-13 ceftriaxone/flagyl       Assessment: 52 yo female with dm2, esrd, calciphylaxis, chronic 2-3 months right lateral foot ulcer that has been oozing the last few days so came in got admitted 9/11  No pain/purulence, abscess/cellulitis  Mri suggestive of cuboid edema next to the lateral ulcer which is rather small   Sed rate is normal; crp mildly elevated in setting esrd/ulcer    Query if in fact this is osteomyelitis at all. Ulcer chronicity does heighten the suspicion for osteomyelitis (pathology dependent dx). But again sed rate is normal. Other ddx charcot arthropathy of the foot   She underwent RLE angiogram this admission today that showed: Right lower extremity: Common femoral artery: patent  Profunda femoris artery: patent  Superficial femoral artery: patent Popliteal artery: patent Anterior tibial artery: occluded Tibioperoneal trunk: patent Peroneal artery: patent Posterior tibial artery: patent Pedal circulation: fills via PT / Peroneal  No surgical intervention in terms of vascular recannulation needed at this time per vasc surg    Offer her that given non-toxic nature and dr Lajoyce Corners not available till next week outpatient f/u with him with potential oral abx but she refused     Plan: Given this and ambiguity/unclear nature of mri finding, and ideally targetted therapy desired, would hold antibiotics till next week evaluation by Dr Lajoyce Corners Stop abx today Will f/u next week Discussed with primary team      ------------------------------------------------ Principal Problem:   Ischemic ulcer of right foot (HCC) Active Problems:   Type 2 diabetes mellitus with ESRD (end-stage renal disease) (HCC)   Mixed hyperlipidemia   Primary hypertension    Acquired hypothyroidism   ESRD (end stage renal disease) on dialysis (HCC)   Chronic diastolic heart failure (HCC)   Anemia of chronic kidney failure, stage 5 (HCC)   Sacral pressure ulcer   Obesity (BMI 30-39.9)   OSA (obstructive sleep apnea)   Acute osteomyelitis of right foot (HCC)    HPI: Amy Moses is a 52 y.o. female dm2, esrd, calciphylaxis, chronic 2-3 months right lateral foot ulcer that has been oozing the last few days so came in got admitted 9/11  She sees dr Lajoyce Corners outpatient periodically and has hh nurse to care for her wound  Reported presence of right foot ulcer for 2-3 months   Hh nurse noted oozing and mal odor last few days so she came in  No f/c/redness/pain/purulence  Afebrile no leukocytosis Bcx negative Platelet chronically low Mri as below Started on bs Abx   Wasn't taking abx at home  Bcx negative   Family History  Problem Relation Age of Onset   Heart failure Mother    Heart disease Mother    Diabetes Mother    Kidney disease Mother    Heart failure Father    Diabetes Father    Heart disease Father    Diabetes Brother    Heart failure Maternal Grandmother    Heart failure Maternal Grandfather    Transient ischemic attack Maternal Grandfather    Colon cancer Neg Hx     Social History   Tobacco Use   Smoking status: Never   Smokeless tobacco: Never  Vaping Use   Vaping status: Never Used  Substance Use Topics   Alcohol use: No   Drug use: No    Allergies  Allergen Reactions   Ace Inhibitors Cough    Review of Systems: ROS All Other ROS was negative, except mentioned above   Past Medical History:  Diagnosis Date   Anemia    Blindness of right eye with low vision in contralateral eye    s/p victrectomy   Chronic diastolic heart failure (HCC) 03/30/2021   Diabetes mellitus, type II (HCC)    Dyslipidemia    Glaucoma    History of blood transfusion    Hypertension    Hypothyroidism (acquired)    Kidney disease     Stage 5   Mitral regurgitation 11/16/2022   Pneumonia    Pulmonary hypertension, unspecified (HCC) 09/12/2022       Scheduled Meds:  aspirin EC  81 mg Oral Daily   atorvastatin  10 mg Oral QPM   Chlorhexidine Gluconate Cloth  6 each Topical Q0600   feeding supplement (NEPRO CARB STEADY)  237 mL Oral BID BM   ferric citrate  630 mg Oral TID WC   insulin aspart  0-6 Units Subcutaneous Q4H   insulin glargine-yfgn  5 Units Subcutaneous QHS   levothyroxine  75 mcg Oral Daily   midodrine  10 mg Oral Daily   multivitamin  1 tablet Oral QHS   nutrition supplement (JUVEN)  1 packet Oral BID BM   pantoprazole  40 mg Oral BID   sodium chloride flush  3 mL Intravenous Q12H   sodium chloride flush  3 mL Intravenous Q12H   Continuous Infusions:  sodium chloride     sodium chloride     cefTRIAXone (ROCEPHIN)  IV 2 g (08/04/23 0014)   And   metronidazole 500 mg (08/04/23 1207)   PRN Meds:.sodium chloride, sodium chloride, acetaminophen, hydrALAZINE, HYDROcodone-acetaminophen, labetalol, ondansetron (ZOFRAN) IV, ondansetron **OR** [DISCONTINUED] ondansetron (ZOFRAN) IV, sodium chloride flush, sodium chloride flush   OBJECTIVE: Blood pressure 108/71, pulse 68, temperature (!) 97.5 F (36.4 C), temperature source Oral, resp. rate 14, height 5\' 6"  (1.676 m), weight 91.7 kg, last menstrual period 08/22/2015, SpO2 99%.  Physical Exam General/constitutional: no distress, pleasant HEENT: Normocephalic, PER, Conj Clear, EOMI, Oropharynx clear Neck supple CV: rrr no mrg Lungs: clear to auscultation, normal respiratory effort Abd: Soft, Nontender Ext/skin: brawny distal bilateral LE edema; dressing bilateral feet dry/clean; fibrotic hyperkeratotic changes in toes; reviewed picture of wound -- very small ulcer with eschar   Neuro: nonfocal MSK: no peripheral joint swelling/tenderness/warmth     Lab Results Lab Results  Component Value Date   WBC 6.7 08/04/2023   HGB 10.9 (L)  08/04/2023   HCT 35.7 (L) 08/04/2023   MCV 93.5 08/04/2023   PLT 95 (L) 08/04/2023    Lab Results  Component Value Date   CREATININE 6.92 (H) 08/03/2023   BUN 43 (H) 08/03/2023   NA 135 08/03/2023   K 3.8 08/03/2023   CL 100 08/03/2023   CO2 21 (L) 08/03/2023    Lab Results  Component Value Date   ALT 13 08/03/2023   AST 11 (L) 08/03/2023   ALKPHOS 117 08/03/2023   BILITOT 0.6 08/03/2023    Lab Results  Component Value Date   ESRSEDRATE 7 08/03/2023      Component Value Date/Time   CRP 1.7 (H) 08/03/2023 0019   CRP 7.0 (H) 02/14/2022 1707   CRP 11.1 (H) 08/07/2021 0411     Microbiology: Recent Results (from the past 240  hour(s))  Blood Cultures x 2 sites     Status: None (Preliminary result)   Collection Time: 08/02/23  6:35 PM   Specimen: BLOOD  Result Value Ref Range Status   Specimen Description BLOOD SITE NOT SPECIFIED  Final   Special Requests   Final    BOTTLES DRAWN AEROBIC AND ANAEROBIC Blood Culture results may not be optimal due to an inadequate volume of blood received in culture bottles   Culture   Final    NO GROWTH 2 DAYS Performed at [name], [address] [place], [place] [address]    Report Status PENDING  Incomplete  Blood Cultures x 2 sites     Status: None (Preliminary result)   Collection Time: 08/02/23  6:40 PM   Specimen: BLOOD  Result Value Ref Range Status   Specimen Description BLOOD SITE NOT SPECIFIED  Final   Special Requests   Final    BOTTLES DRAWN AEROBIC AND ANAEROBIC Blood Culture adequate volume   Culture   Final    NO GROWTH 2 DAYS Performed at [facility], [address] [place], [place] [address]    Report Status PENDING  Incomplete  MRSA Next Gen by PCR, Nasal     Status: None   Collection Time: 08/03/23  4:40 AM   Specimen: Nasal Mucosa; Nasal Swab  Result Value Ref Range Status   MRSA by PCR Next Gen NOT DETECTED NOT DETECTED Final    Comment: (NOTE) The GeneXpert MRSA Assay (FDA approved for  NASAL specimens only), is one component of a comprehensive MRSA colonization surveillance program. It is not intended to diagnose MRSA infection nor to guide or monitor treatment for MRSA infections. Test performance is not FDA approved in patients less than 48 years old. Performed at [facility], [address], [place], [place] [address]      Serology:    Imaging: If present, new imagings (plain films, ct scans, and mri) have been personally visualized and interpreted; radiology reports have been reviewed. Decision making incorporated into the Impression / Recommendations.  9/13 mri right heel 1. Soft tissue wound along the plantar lateral aspect of the cuboid with subcortical bone marrow edema consistent with osteomyelitis. 2. Moderate-severe osteoarthritis of the tibiotalar joint.   9/12 mri right foot 1. Persistent soft tissue ulcer along the inferolateral aspect of the cuboid with underlying marrow changes in the cuboid, suspicious for osteomyelitis. This is incompletely visualized on this examination of the forefoot. Recommend follow-up imaging of the hindfoot and midfoot. 2. No evidence of osteomyelitis within the forefoot. 3. Advanced degenerative changes at the 1st metatarsophalangeal joint with a moderate hallux valgus deformity. 4. Diffuse dorsal subcutaneous edema without focal fluid collection. 5. Old healed fractures of the 2nd and 3rd metatarsal shafts. Previous amputation of the 5th ray.   Raymondo Band, MD [facility] [phone] pager    08/04/2023, 3:12 PM

## 2023-08-04 NOTE — Op Note (Signed)
DATE OF SERVICE: 08/04/2023  PATIENT:  Amy Moses  52 y.o. female  PRE-OPERATIVE DIAGNOSIS:  Atherosclerosis of native arteries of right lower extremity causing ulceration  POST-OPERATIVE DIAGNOSIS:  Same  PROCEDURE:   1) Ultrasound guided left common femoral artery access 2) Aortogram 3) Right lower extremity angiogram with second order cannulation.  4) Conscious sedation (9 minutes)  SURGEON:  Rande Brunt. Lenell Antu, MD  ASSISTANT: none  ANESTHESIA:   local and IV sedation  ESTIMATED BLOOD LOSS: minimal  LOCAL MEDICATIONS USED:  LIDOCAINE   COUNTS: confirmed correct.  PATIENT DISPOSITION:  PACU - hemodynamically stable.   Delay start of Pharmacological VTE agent (>24hrs) due to surgical blood loss or risk of bleeding: no  INDICATION FOR PROCEDURE: Amy Moses is a 53 y.o. female with right foot ulceration, ESRD, calciphylaxis, and non-invasive evidence of peripheral arterial disaese. After careful discussion of risks, benefits, and alternatives the patient was offered angiogram. The patient understood and wished to proceed.  OPERATIVE FINDINGS:  Renal arteries patent Infrarenal aorta patent Bilateral common iliac arteries patent Bilateral internal iliac arteries patent Bilateral external iliac arteries patent  Right lower extremity: Common femoral artery: patent  Profunda femoris artery: patent  Superficial femoral artery: patent Popliteal artery: patent Anterior tibial artery: occluded Tibioperoneal trunk: patent Peroneal artery: patent Posterior tibial artery: patent Pedal circulation: fills via PT / Peroneal  DESCRIPTION OF PROCEDURE: After identification of the patient in the pre-operative holding area, the patient was transferred to the operating room. The patient was positioned supine on the operating room table.  Anesthesia was induced. The groins was prepped and draped in standard fashion. A surgical pause was performed confirming correct patient,  procedure, and operative location.  The left groin was anesthetized with subcutaneous injection of 1% lidocaine. Using ultrasound guidance, the left common femoral artery was accessed with micropuncture technique. Fluoroscopy was used to confirm cannulation over the femoral head. The 58F sheath was upsized to 36F.   A Benson wire was advanced into the distal aorta. Over the wire an omni flush catheter was advanced to the level of L2. Aortogram was performed - see above for details.   The right common iliac artery was selected with an omniflush catheter and benson guidewire. The wire was advanced into the common femoral artery. Over the wire the omni flush catheter was advanced into the external iliac artery. Selective angiography was performed - see above for details.   The sheath was left in place to be removed in the recovery area  Conscious sedation was administered with the use of IV fentanyl and midazolam under continuous physician and nurse monitoring.  Heart rate, blood pressure, and oxygen saturation were continuously monitored.  Total sedation time was 9 minutes  Upon completion of the case instrument and sharps counts were confirmed correct. The patient was transferred to the PACU in good condition. I was present for all portions of the procedure.  PLAN: ASA 81 mg by mouth daily. High intensity statin therapy. Patient has "in line" flow to foot, and is optimized from a vascular standpoint. Recommend local care to the foot.   Rande Brunt. Lenell Antu, MD [facility] Phone Number: [phone] 08/04/2023 2:33 PM

## 2023-08-04 NOTE — Progress Notes (Incomplete)
[facility] Consultation Note     S:   HD yesterday: 2L UF For angio of RLE today with VVS ***  Past Medical History:  Diagnosis Date   Anemia    Blindness of right eye with low vision in contralateral eye    s/p victrectomy   Chronic diastolic heart failure (HCC) 03/30/2021   Diabetes mellitus, type II (HCC)    Dyslipidemia    Glaucoma    History of blood transfusion    Hypertension    Hypothyroidism (acquired)    Kidney disease    Stage 5   Mitral regurgitation 11/16/2022   Pneumonia    Pulmonary hypertension, unspecified (HCC) 09/12/2022   Past Surgical History:  Procedure Laterality Date   ABDOMINAL AORTOGRAM W/LOWER EXTREMITY Bilateral 12/18/2020   Procedure: ABDOMINAL AORTOGRAM W/LOWER EXTREMITY;  Surgeon: Sherren Kerns, MD;  Location: [facility];  Service: Cardiovascular;  Laterality: Bilateral;   ABDOMINAL AORTOGRAM W/LOWER EXTREMITY Bilateral 01/25/2022   Procedure: ABDOMINAL AORTOGRAM W/LOWER EXTREMITY;  Surgeon: Nada Libman, MD;  Location: [facility];  Service: Cardiovascular;  Laterality: Bilateral;   AMPUTATION Right 02/16/2022   Procedure: RIGHT FOOT 5TH RAY AMPUTATION;  Surgeon: Nadara Mustard, MD;  Location[facility] OR;  Service: Orthopedics;  Laterality: Right;   ANKLE FRACTURE SURGERY Right    AV FISTULA PLACEMENT Left 08/18/2020   Procedure: LEFT ARM BRACHIOCEPHALIC ARTERIOVENOUS (AV) FISTULA CREATION;  Surgeon: Sherren Kerns, MD;  Location: [facility] OR;  Service: Vascular;  Laterality: Left;   BIOPSY  04/24/2021   Procedure: BIOPSY;  Surgeon: Lanelle Bal, DO;  Location: [facility];  Service: Endoscopy;;   CESAREAN SECTION     CHOLECYSTECTOMY     COLONOSCOPY  04/24/2021   Surgeon: Lanelle Bal, DO;  nonbleeding internal hemorrhoids, 1 large (25 mm) pedunculated transverse colon polyp (prolapse type polyp) with adherent clot and stigmata of recent bleed.   COLONOSCOPY WITH PROPOFOL N/A 05/14/2021    Procedure: COLONOSCOPY WITH PROPOFOL;  Surgeon: Corbin Ade, MD;  Location: [facility];  Service: Endoscopy;  Laterality: N/A;   COLONOSCOPY WITH PROPOFOL N/A 03/17/2023   Procedure: COLONOSCOPY WITH PROPOFOL;  Surgeon: Corbin Ade, MD;  [facility]: [facility];  Service: Endoscopy;  Laterality: N/A;   ESOPHAGOGASTRODUODENOSCOPY (EGD) WITH PROPOFOL N/A 04/24/2021   Surgeon: Lanelle Bal, DO;  duodenal erosions and gastritis biopsied (pathology with peptic duodenitis, reactive gastropathy with erosions/chronic inflammation, negative for H. pylori)   EYE SURGERY     Vatrectomy   HEMOSTASIS CLIP PLACEMENT  05/14/2021   Procedure: HEMOSTASIS CLIP PLACEMENT;  Surgeon: Corbin Ade, MD;  [facility]: [facility];  Service: Endoscopy;;   IR PERC TUN PERIT CATH WO PORT S&I /IMAG  09/15/2020   IR REMOVAL TUN CV CATH W/O FL  02/19/2021   IR US GUIDE VASC ACCESS RIGHT  09/15/2020   POLYPECTOMY  04/24/2021   Procedure: POLYPECTOMY;  Surgeon: Lanelle Bal, DO;  [facility];  Service: Endoscopy;;   POLYPECTOMY  05/14/2021   Procedure: POLYPECTOMY;  Surgeon: Corbin Ade, MD;  [facility]: [facility];  Service: Endoscopy;;   POLYPECTOMY  03/17/2023   Procedure: POLYPECTOMY;  Surgeon: Corbin Ade, MD;  [facility];  Service: Endoscopy;;   SKIN SPLIT GRAFT Bilateral 09/03/2021   Procedure: SKIN GRAFT BILATERAL LEGS;  Surgeon: Nadara Mustard, MD;  Location: [facility] OR;  Service: Orthopedics;  Laterality: Bilateral;   SKIN SPLIT GRAFT  Left 12/10/2021   Procedure: IRRIGATION AND DEBRIDEMENT LEFT CALF, APPLICATION SPLIT THICKNESS SKIN GRAFT;  Surgeon: Nadara Mustard, MD;  Location: [facility] OR;  Service: Orthopedics;  Laterality: Left;   TOE SURGERY     Family History  Problem Relation Age of Onset   Heart failure Mother    Heart disease Mother    Diabetes Mother    Kidney disease Mother    Heart failure Father    Diabetes Father    Heart disease Father    Diabetes  Brother    Heart failure Maternal Grandmother    Heart failure Maternal Grandfather    Transient ischemic attack Maternal Grandfather    Colon cancer Neg Hx    Social History:  reports that she has never smoked. She has never used smokeless tobacco. She reports that she does not drink alcohol and does not use drugs.  ROS: As per HPI otherwise negative.  Physical Exam: Vitals:   08/03/23 2101 08/03/23 2143 08/04/23 0634 08/04/23 0741  BP:  107/66 109/68 101/62  Pulse:  73 74 66  Resp:  18 18 16   Temp:  97.7 F (36.5 C) (!) 96.9 F (36.1 C) (!) 97.4 F (36.3 C)  TempSrc:  Oral  Oral  SpO2:  99% 100% 99%  Weight: 91.7 kg     Height:         General: Alert female in NAD Head: Normocephalic, atraumatic, sclera non-icteric, mucus membranes are moist. Lungs: Clear bilaterally to auscultation without wheezes, rales, or rhonchi. Breathing is unlabored. Heart: RRR with normal S1, S2. No murmurs, rubs, or gallops appreciated. Abdomen: Soft, non-tender, non-distended with normoactive bowel sounds.  Musculoskeletal:  Strength and tone appear normal for age. Lower extremities: tense edema b/l upper legs, right foot wound Neuro: Alert and oriented X 3. Moves all extremities spontaneously. Psych:  Responds to questions appropriately with a normal affect. Dialysis Access: LUE AVF +t/b  Allergies  Allergen Reactions   Ace Inhibitors Cough   Prior to Admission medications   Medication Sig Start Date End Date Taking? Authorizing Provider  acetaminophen (TYLENOL) 500 MG tablet Take 1,000 mg by mouth every 6 (six) hours as needed for moderate pain.   Yes [provider]  atorvastatin (LIPITOR) 10 MG tablet Take 10 mg by mouth every evening.   Yes [provider]  AURYXIA 1 GM 210 MG(Fe) tablet Take 630 mg by mouth 3 (three) times daily with meals. 01/07/22  Yes [provider]  HUMALOG KWIKPEN 100 UNIT/ML KwikPen Inject 5-11 Units into the skin 3 (three) times daily  with meals. If eats 50% or more of meal. Patient taking differently: Inject 5-11 Units into the skin 3 (three) times daily with meals. If eats 50% or more of meal. Sliding scale 03/16/22  Yes Nida, Denman George, MD  insulin glargine (LANTUS) 100 UNIT/ML injection Inject 0.2 mLs (20 Units total) into the skin at bedtime. Patient taking differently: Inject 8 Units into the skin at bedtime. 04/06/22  Yes Nida, Denman George, MD  levothyroxine (SYNTHROID) 75 MCG tablet Take 75 mcg by mouth daily. 06/13/22  Yes [provider]  midodrine (PROAMATINE) 10 MG tablet Take 10 mg by mouth daily. 09/27/22  Yes [provider]  pantoprazole (PROTONIX) 40 MG tablet Take 1 tablet (40 mg total) by mouth 2 (two) times daily. 04/25/21  Yes Emokpae, Courage, MD  polyethylene glycol powder (MIRALAX) 17 GM/SCOOP powder Take 17 g by mouth daily. 03/17/23  Yes Narda Bonds, MD  Vitamin D, Ergocalciferol, (DRISDOL) 1.25 MG (50000 UNIT) CAPS capsule Take 50,000 Units by mouth every 7 (seven) days. Friday 04/20/21  Yes [provider]  Blood Glucose Monitoring Suppl (ACCU-CHEK GUIDE ME) w/Device KIT 1 Piece by Does not apply route as directed. 03/16/22   Roma Kayser, MD  Blood Pressure Monitor KIT TAKE BLOOD PRESSURE DAILY 09/15/22   Chilton Si, MD  Continuous Blood Gluc Receiver (FREESTYLE LIBRE 2 READER) DEVI As directed 04/06/22   Roma Kayser, MD  Continuous Blood Gluc Sensor (FREESTYLE LIBRE 2 SENSOR) MISC 1 Piece by Does not apply route every 14 (fourteen) days. 04/06/22   Roma Kayser, MD  glucose blood (ACCU-CHEK GUIDE) test strip Use as instructed 03/16/22   Roma Kayser, MD   Current Facility-Administered Medications  Medication Dose Route Frequency Provider Last Rate Last Admin   0.9 %  sodium chloride infusion  250 mL Intravenous PRN Therisa Doyne, MD       acetaminophen (TYLENOL) tablet 650 mg  650 mg Oral Q6H PRN Therisa Doyne, MD        Or   acetaminophen (TYLENOL) suppository 650 mg  650 mg Rectal Q6H PRN Doutova, Anastassia, MD       atorvastatin (LIPITOR) tablet 10 mg  10 mg Oral QPM Doutova, Anastassia, MD   10 mg at 08/03/23 2155   cefTRIAXone (ROCEPHIN) 2 g in sodium chloride 0.9 % 100 mL IVPB  2 g Intravenous Q24H Doutova, Anastassia, MD 200 mL/hr at 08/04/23 0014 2 g at 08/04/23 0014   And   metroNIDAZOLE (FLAGYL) IVPB 500 mg  500 mg Intravenous Q12H Doutova, Anastassia, MD 100 mL/hr at 08/03/23 2252 500 mg at 08/03/23 2252   Chlorhexidine Gluconate Cloth 2 % PADS 6 each  6 each Topical Q0600 Oretha Milch, PA-C   6 each at 08/04/23 4098   feeding supplement (NEPRO CARB STEADY) liquid 237 mL  237 mL Oral BID BM Carollee Herter, DO   237 mL at 08/03/23 1128   ferric citrate (AURYXIA) tablet 630 mg  630 mg Oral TID WC Doutova, Anastassia, MD   630 mg at 08/03/23 2156   HYDROcodone-acetaminophen (NORCO/VICODIN) 5-325 MG per tablet 1-2 tablet  1-2 tablet Oral Q4H PRN Therisa Doyne, MD   2 tablet at 08/04/23 0804   insulin aspart (novoLOG) injection 0-6 Units  0-6 Units Subcutaneous Q4H Therisa Doyne, MD   2 Units at 08/04/23 0604   insulin glargine-yfgn (SEMGLEE) injection 5 Units  5 Units Subcutaneous QHS Therisa Doyne, MD   5 Units at 08/03/23 2156   levothyroxine (SYNTHROID) tablet 75 mcg  75 mcg Oral Daily Therisa Doyne, MD   75 mcg at 08/04/23 0604   midodrine (PROAMATINE) tablet 10 mg  10 mg Oral Daily Doutova, Anastassia, MD   10 mg at 08/03/23 1127   multivitamin (RENA-VIT) tablet 1 tablet  1 tablet Oral QHS Carollee Herter, DO   1 tablet at 08/03/23 2156   nutrition supplement (JUVEN) (JUVEN) powder packet 1 packet  1 packet Oral BID BM Carollee Herter, DO   1 packet at 08/03/23 1139   ondansetron (ZOFRAN) tablet 4 mg  4 mg Oral Q6H PRN Therisa Doyne, MD       Or   ondansetron (ZOFRAN) injection 4 mg  4 mg Intravenous Q6H PRN Doutova, Anastassia, MD       pantoprazole (PROTONIX) EC tablet 40  mg  40 mg Oral BID Therisa Doyne, MD   40 mg at 08/03/23 2155  sodium chloride flush (NS) 0.9 % injection 3 mL  3 mL Intravenous Q12H Doutova, Anastassia, MD   3 mL at 08/03/23 2155   sodium chloride flush (NS) 0.9 % injection 3 mL  3 mL Intravenous PRN Therisa Doyne, MD       Labs: Basic Metabolic Panel: Recent Labs  Lab 08/02/23 1438 08/03/23 0600  NA 134* 135  K 4.1 3.8  CL 99 100  CO2 24 21*  GLUCOSE 157* 223*  BUN 37* 43*  CREATININE 6.21* 6.92*  CALCIUM 7.8* 7.8*  PHOS  --  6.7*   Liver Function Tests: Recent Labs  Lab 08/02/23 1435 08/02/23 1438 08/03/23 0600  AST 14* 13* 11*  ALT 15 15 13   ALKPHOS 134* 131* 117  BILITOT 1.0 0.9 0.6  PROT 7.1 7.0 6.1*  ALBUMIN 4.0 4.0 3.3*   CBC: Recent Labs  Lab 08/02/23 1438 08/03/23 0600 08/04/23 0558  WBC 7.8 6.4 6.7  NEUTROABS 6.0  --   --   HGB 11.4* 9.9* 10.9*  HCT 36.7 32.7* 35.7*  MCV 95.6 93.2 93.5  PLT 86* 75* 95*   CBG: Recent Labs  Lab 08/03/23 0814 08/03/23 1153 08/03/23 2140 08/04/23 0105 08/04/23 0740  GLUCAP 163* 139* 146* 257* 157*   Iron Studies:  Recent Labs    08/03/23 0019  IRON 52  TIBC 230*  FERRITIN 1,099*   Studies/Results: VAS [name] ABI WITH/WO TBI  Result Date: 08/03/2023  LOWER EXTREMITY DOPPLER STUDY Patient Name:  NAVAH PELTS  Date of Exam:   08/03/2023 Medical Rec #: [id number]        Accession #:    [id number] Date of Birth: 16-Jul-1971       Patient Gender: F Patient Age:   52 years Exam Location:  [facility] Procedure:      VAS [name] ABI WITH/WO TBI Referring Phys: ANASTASSIA DOUTOVA --------------------------------------------------------------------------------  Indications: Ulceration. High Risk Factors: Hypertension, hyperlipidemia, Diabetes.  Limitations: Today's exam was limited due to bandages , an open wound and              Bilateral skin induration, Left restricted arm. Comparison Study: No prior studies. Performing Technologist: Olen Cordial RVT   Examination Guidelines: A complete evaluation includes at minimum, Doppler waveform signals and systolic blood pressure reading at the level of bilateral brachial, anterior tibial, and posterior tibial arteries, when vessel segments are accessible. Bilateral testing is considered an integral part of a complete examination. Photoelectric Plethysmograph (PPG) waveforms and toe systolic pressure readings are included as required and additional duplex testing as needed. Limited examinations for reoccurring indications may be performed as noted.  ABI Findings: +---------+------------------+-----+----------+--------+ Right    Rt Pressure (mmHg)IndexWaveform  Comment  +---------+------------------+-----+----------+--------+ Brachial 129                    triphasic          +---------+------------------+-----+----------+--------+ PTA      254               1.97 monophasic         +---------+------------------+-----+----------+--------+ DP       254               1.97 monophasic         +---------+------------------+-----+----------+--------+ Great Toe55                0.43                    +---------+------------------+-----+----------+--------+ +--------+------------------+-----+------------------+----------+  Left    Lt Pressure (mmHg)IndexWaveform          Comment    +--------+------------------+-----+------------------+----------+ Brachial                                         Restricted +--------+------------------+-----+------------------+----------+ PTA                            Unable to insonateInduration +--------+------------------+-----+------------------+----------+ DP      254               1.97                              +--------+------------------+-----+------------------+----------+ +-------+-----------+-----------+------------+------------+ ABI/TBIToday's ABIToday's TBIPrevious ABIPrevious TBI  +-------+-----------+-----------+------------+------------+ Right  [place]         0.43                                +-------+-----------+-----------+------------+------------+ Left   [place]                                             +-------+-----------+-----------+------------+------------+   Summary: Right: Resting right ankle-brachial index indicates noncompressible right lower extremity arteries. The right toe-brachial index is abnormal. Left: Resting left ankle-brachial index indicates noncompressible left lower extremity arteries. Unable to obtain TBI due to great toe girth. *See table(s) above for measurements and observations.  Electronically signed by Heath Lark on 08/03/2023 at 10:52:38 PM.    Final    MR FOOT RIGHT WO CONTRAST  Result Date: 08/03/2023 CLINICAL DATA:  Diabetic with nonhealing foot wound. Soft tissue infection suspected. Previous 5th ray amputation. EXAM: MRI OF THE RIGHT FOREFOOT WITHOUT CONTRAST TECHNIQUE: Multiplanar, multisequence MR imaging of the right forefoot was performed. No intravenous contrast was administered. COMPARISON:  Radiographs 08/02/2023 and 05/10/2023. Previous MRI of the right foot and lower leg 05/02/2022. FINDINGS: Bones/Joint/Cartilage Today's examination was performed with a small field-of-view and concentrates on the forefoot. The midfoot and hindfoot are incompletely visualized. Previous amputation of the 5th ray. No acute osseous findings or evidence of osteomyelitis within the forefoot. There are old healed fractures of the 2nd and 3rd metatarsal shafts. There are advanced degenerative changes at the 1st metatarsophalangeal joint with a moderate hallux valgus deformity. No significant joint effusions. The alignment appears normal at the Lisfranc joint. Short axis axial and sagittal images partially image the cuboid. As seen on the previous MRI, there is marrow T2 hyperintensity along the inferolateral aspect of the cuboid adjacent to an overlying  skin defect, suspicious for osteomyelitis. This is incompletely visualized on this examination. Ligaments Intact Lisfranc ligament. The collateral ligaments of the metatarsophalangeal joints appear intact. Muscles and Tendons Generalized forefoot muscular atrophy. The forefoot tendons appear intact, without significant tenosynovitis. Soft tissues Heterogeneous fat saturation secondary to the postsurgical changes in the distal lower leg. There is diffuse dorsal subcutaneous edema without focal fluid collection. As described above, there is a persistent soft tissue ulcer along the inferolateral aspect of the cuboid, incompletely visualized on this examination of the forefoot. IMPRESSION: 1. Persistent soft tissue ulcer along the inferolateral aspect of the cuboid with underlying marrow changes in the  cuboid, suspicious for osteomyelitis. This is incompletely visualized on this examination of the forefoot. Recommend follow-up imaging of the hindfoot and midfoot. 2. No evidence of osteomyelitis within the forefoot. 3. Advanced degenerative changes at the 1st metatarsophalangeal joint with a moderate hallux valgus deformity. 4. Diffuse dorsal subcutaneous edema without focal fluid collection. 5. Old healed fractures of the 2nd and 3rd metatarsal shafts. Previous amputation of the 5th ray. Electronically Signed   By: Carey Bullocks M.D.   On: 08/03/2023 14:51   DG Foot Complete Right  Result Date: 08/02/2023 CLINICAL DATA:  Right foot wound EXAM: RIGHT FOOT COMPLETE - 3 VIEW COMPARISON:  X-ray 05/10/2023. Images only. There is no report available. FINDINGS: Fixation hardware seen about the distal tibia and fibula at the edge of the imaging field. There is severe osteopenia. Global soft tissue swelling about the foot with some plantar soft tissue gas at the level of the midfoot on the lateral view. Severe osteopenia with multifocal areas of bony deformity consistent with old trauma there is also hallux valgus  deformity of the first ray with hypertrophic degenerative changes. 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 No definite erosive changes identified at this time. Vascular calcifications are seen. Plantar calcaneal spur. If there is further concern of bone infection, follow up MRI or bone scan may be useful for further sensitivity. IMPRESSION: Extensive soft tissue swelling about the foot with some new plantar soft tissue gas. Please correlate with clinical findings. Previous surgical changes from amputation of fifth ray, fixation hardware about the ankle. Severe osteopenia with degenerative changes and posttraumatic changes. Electronically Signed   By: Karen Kays M.D.   On: 08/02/2023 15:54    Outpatient Dialysis Orders:  From May 2024: [facility] 3 hours 45 minutes  EDW 93. HD Bath unknown, Dialyzer unknown, Heparin unknown. Access AVF. Requesting updated records  Assessment/Plan:  R foot wound: Ortho consulting, on empiric antibiotics Hx calciphylaxis: Reports no new wounds recently except for her foot which she reports is due to DM, no longer on sodium thiosulfate  ESRD:  TTS schedule, next HD today. Recent admission for rectal bleeding, no heparin with HD  Hypertension/volume: + edema in legs which she reports is chronic. BP soft, takes midodrine pre-HD. Will attempt 2L UF as tolerated today  Anemia: Hgb 9.9, requesting ESA records  Metabolic bone disease: Phos 6.7, continue binders. Calcium slightly low but with hx calciphylaxis. Requesting VDRA hx  Nutrition:  Hgb 3.3, will need renal diet/protein supplement and fluid restriction T2DM: On insulin, per admitting team  Rogers Blocker, PA-C 08/04/2023, 8:54 AM  [facility] Pager: [phone]

## 2023-08-05 DIAGNOSIS — I5032 Chronic diastolic (congestive) heart failure: Secondary | ICD-10-CM | POA: Diagnosis not present

## 2023-08-05 DIAGNOSIS — E039 Hypothyroidism, unspecified: Secondary | ICD-10-CM | POA: Diagnosis not present

## 2023-08-05 DIAGNOSIS — M86171 Other acute osteomyelitis, right ankle and foot: Secondary | ICD-10-CM | POA: Diagnosis not present

## 2023-08-05 DIAGNOSIS — L97519 Non-pressure chronic ulcer of other part of right foot with unspecified severity: Secondary | ICD-10-CM | POA: Diagnosis not present

## 2023-08-05 LAB — GLUCOSE, CAPILLARY
Glucose-Capillary: 137 mg/dL — ABNORMAL HIGH (ref 70–99)
Glucose-Capillary: 180 mg/dL — ABNORMAL HIGH (ref 70–99)
Glucose-Capillary: 186 mg/dL — ABNORMAL HIGH (ref 70–99)
Glucose-Capillary: 211 mg/dL — ABNORMAL HIGH (ref 70–99)
Glucose-Capillary: 239 mg/dL — ABNORMAL HIGH (ref 70–99)

## 2023-08-05 LAB — CBC
HCT: 34.3 % — ABNORMAL LOW (ref 36.0–46.0)
Hemoglobin: 10.8 g/dL — ABNORMAL LOW (ref 12.0–15.0)
MCH: 29.9 pg (ref 26.0–34.0)
MCHC: 31.5 g/dL (ref 30.0–36.0)
MCV: 95 fL (ref 80.0–100.0)
Platelets: 106 10*3/uL — ABNORMAL LOW (ref 150–400)
RBC: 3.61 MIL/uL — ABNORMAL LOW (ref 3.87–5.11)
RDW: 15.1 % (ref 11.5–15.5)
WBC: 7.8 10*3/uL (ref 4.0–10.5)
nRBC: 0 % (ref 0.0–0.2)

## 2023-08-05 LAB — RENAL FUNCTION PANEL
Albumin: 3.5 g/dL (ref 3.5–5.0)
Anion gap: 13 (ref 5–15)
BUN: 59 mg/dL — ABNORMAL HIGH (ref 6–20)
CO2: 21 mmol/L — ABNORMAL LOW (ref 22–32)
Calcium: 8.3 mg/dL — ABNORMAL LOW (ref 8.9–10.3)
Chloride: 99 mmol/L (ref 98–111)
Creatinine, Ser: 7.73 mg/dL — ABNORMAL HIGH (ref 0.44–1.00)
GFR, Estimated: 6 mL/min — ABNORMAL LOW (ref >60)
Glucose, Bld: 198 mg/dL — ABNORMAL HIGH (ref 70–99)
Phosphorus: 6.2 mg/dL — ABNORMAL HIGH (ref 2.5–4.6)
Potassium: 4.6 mmol/L (ref 3.5–5.1)
Sodium: 133 mmol/L — ABNORMAL LOW (ref 135–145)

## 2023-08-05 LAB — LIPID PANEL
Cholesterol: 56 mg/dL (ref 0–200)
HDL: 36 mg/dL — ABNORMAL LOW (ref >40)
LDL Cholesterol: 13 mg/dL (ref 0–99)
Total CHOL/HDL Ratio: 1.6 ratio
Triglycerides: 36 mg/dL (ref <150)
VLDL: 7 mg/dL (ref 0–40)

## 2023-08-05 MED ORDER — LIDOCAINE HCL (PF) 1 % IJ SOLN: 5.0000 mL | INTRAMUSCULAR | Status: DC | PRN

## 2023-08-05 MED ORDER — ALTEPLASE 2 MG IJ SOLR: 2.0000 mg | Freq: Once | INTRAMUSCULAR | Status: DC | PRN

## 2023-08-05 MED ORDER — LIDOCAINE-PRILOCAINE 2.5-2.5 % EX CREA: 1.0000 | TOPICAL_CREAM | CUTANEOUS | Status: DC | PRN

## 2023-08-05 MED ORDER — HEPARIN SODIUM (PORCINE) 1000 UNIT/ML DIALYSIS: 1000.0000 [IU] | INTRAMUSCULAR | Status: DC | PRN

## 2023-08-05 MED ORDER — PENTAFLUOROPROP-TETRAFLUOROETH EX AERO: 1.0000 | INHALATION_SPRAY | CUTANEOUS | Status: DC | PRN

## 2023-08-05 MED ORDER — ANTICOAGULANT SODIUM CITRATE 4% (200MG/5ML) IV SOLN
5.0000 mL | Status: DC | PRN
  Filled 2023-08-05: qty 5

## 2023-08-05 NOTE — Progress Notes (Signed)
   08/05/23 0013  BiPAP/CPAP/SIPAP  $ Non-Invasive Home Ventilator  Subsequent  BiPAP/CPAP/SIPAP Pt Type Adult  BiPAP/CPAP/SIPAP Resmed  Mask Type Nasal pillows  Mask Size Medium  Patient Home Equipment Yes  Nasal massage performed Yes  Safety Check Completed by RT for Home Unit Yes, no issues noted  BiPAP/CPAP /SiPAP Vitals  Pulse Rate 77  Resp 16  SpO2 96 %  MEWS Score/Color  MEWS Score 0  MEWS Score Color Chilton Si

## 2023-08-05 NOTE — Progress Notes (Signed)
   08/05/23 1534  Vitals  Temp 98.3 F (36.8 C)  Pulse Rate 78  Resp 20  BP 120/78  SpO2 100 %  O2 Device Room Air  Weight  (specialty bed---unable to weigh)  Oxygen Therapy  Patient Activity (if Appropriate) In bed  Pulse Oximetry Type Continuous  Oximetry Probe Site Changed No  Post Treatment  Dialyzer Clearance Lightly streaked  Hemodialysis Intake (mL) 0 mL  Liters Processed 83.9  Fluid Removed (mL) 2500 mL  Tolerated HD Treatment Yes  AVG/AVF Arterial Site Held (minutes) 10 minutes  AVG/AVF Venous Site Held (minutes) 10 minutes   Received patient in bed to unit.  Alert and oriented.  Informed consent signed and in chart.   TX duration:3.5  Patient tolerated well.  Transported back to the room  Alert, without acute distress.  Hand-off given to patient's nurse.   Access used: LUAF Access issues: no complications  Total UF removed: 2500 Medication(s) given: none   [facility]

## 2023-08-05 NOTE — Progress Notes (Signed)
[facility]  PROGRESS NOTE  Amy Moses LKG:[id number] DOB: Oct 15, 1971 DOA: 08/02/2023 PCP: Lorelei Pont, DO   Brief HPI:   52 y.o. female with medical history significant of DM2, ESRD, chronic foot infection, calciphylaxis,  Presented with  right foot wound She has history of nonhealing foot ulcers in the past as well as calciphylaxis And end-stage diabetes last hemodialysis was yesterday she has home health nurse who has been doing compression dressings to her legs but this morning the wound care nurse seen her and was concerned because it was more draining and her foot appears to be red no fevers or chills patient is diabetic neuropathy. Orthopedics, ID and vascular surgery consulted   Assessment/Plan:   Ischemic ulcer of right foot (HCC) -Draining right foot ulcer -Orthopedics consulted, recommended to get ABIs and vascular surgery consultation -ABIs obtained underwent left common femoral access for right lower extremity angiogram; optimized for surgery as per vascular - MRI right foot completed.MRI suggestive of cuboid edema next to the lateral ulcer.  -Antibiotics stopped by ID team till seen by Dr. Lajoyce Corners on Monday -Orthopedics to follow on Monday  OSA (obstructive sleep apnea) Continue with home CPAP.   Obesity (BMI 30-39.9) BMI 34.7   Sacral pressure ulcer Presents on admission. See wound consult note.  Silicone foam for sacrum.   Anemia of chronic kidney failure, stage 5 (HCC) Baseline HgB 9-10 g/dl   Chronic diastolic heart failure (HCC) Does not make urine. Volume status controlled by HD.   ESRD (end stage renal disease) on dialysis [facility]) Nephrology consulted for routine HD. Dialyzes via left UE AVF   Acquired hypothyroidism Stable. On synthroid 75 mcg daily.   Primary hypertension Stable.   Mixed hyperlipidemia Stable. On lipitor 10 mg daily.   Type 2 diabetes mellitus with ESRD (end-stage renal disease) (HCC) Stable. On lantus and SSI.     Medications     aspirin EC  81 mg Oral Daily   atorvastatin  10 mg Oral QPM   Chlorhexidine Gluconate Cloth  6 each Topical Q0600   feeding supplement (NEPRO CARB STEADY)  237 mL Oral BID BM   ferric citrate  630 mg Oral TID WC   insulin aspart  0-6 Units Subcutaneous Q4H   insulin glargine-yfgn  5 Units Subcutaneous QHS   levothyroxine  75 mcg Oral Daily   midodrine  10 mg Oral Daily   multivitamin  1 tablet Oral QHS   nutrition supplement (JUVEN)  1 packet Oral BID BM   pantoprazole  40 mg Oral BID   sodium chloride flush  3 mL Intravenous Q12H   sodium chloride flush  3 mL Intravenous Q12H     Data Reviewed:   CBG:  Recent Labs  Lab 08/04/23 1633 08/04/23 2022 08/04/23 2334 08/05/23 0345 08/05/23 0747  GLUCAP 142* 214* 161* 186* 180*    SpO2: 96 % O2 Flow Rate (L/min): 2 L/min    Vitals:   08/04/23 2300 08/05/23 0013 08/05/23 0500 08/05/23 0737  BP:      Pulse:  77    Resp:  16 20   Temp: 98 F (36.7 C)  98.9 F (37.2 C) 98.4 F (36.9 C)  TempSrc: Oral  Oral Oral  SpO2:  96%    Weight:      Height:          Data Reviewed:  Basic Metabolic Panel: Recent Labs  Lab 08/02/23 1438 08/03/23 0600  NA 134* 135  K 4.1 3.8  CL 99 100  CO2 24 21*  GLUCOSE 157* 223*  BUN 37* 43*  CREATININE 6.21* 6.92*  CALCIUM 7.8* 7.8*  MG  --  2.2  PHOS  --  6.7*    CBC: Recent Labs  Lab 08/02/23 1438 08/03/23 0600 08/04/23 0558  WBC 7.8 6.4 6.7  NEUTROABS 6.0  --   --   HGB 11.4* 9.9* 10.9*  HCT 36.7 32.7* 35.7*  MCV 95.6 93.2 93.5  PLT 86* 75* 95*    LFT Recent Labs  Lab 08/02/23 1435 08/02/23 1438 08/03/23 0600  AST 14* 13* 11*  ALT 15 15 13   ALKPHOS 134* 131* 117  BILITOT 1.0 0.9 0.6  PROT 7.1 7.0 6.1*  ALBUMIN 4.0 4.0 3.3*     Antibiotics: Anti-infectives (From admission, onward)    Start     Dose/Rate Route Frequency Ordered Stop   08/02/23 2315  cefTRIAXone (ROCEPHIN) 2 g in sodium chloride 0.9 % 100 mL IVPB       Placed in  "And" Linked Group   2 g 200 mL/hr over 30 Minutes Intravenous Every 24 hours 08/02/23 2313 08/04/23 2359   08/02/23 2315  metroNIDAZOLE (FLAGYL) IVPB 500 mg       Placed in "And" Linked Group   500 mg 100 mL/hr over 60 Minutes Intravenous Every 12 hours 08/02/23 2313 08/04/23 2359        DVT prophylaxis: Not on heparin, recent GI bleed  Code Status: Full code  Family Communication: No family at bedside   CONSULTS nephrology   Subjective   Denies pain   Objective    Physical Examination:   General: Appears in no acute distress Cardiovascular: S1-S2, regular Respiratory: Lungs clear to auscultation bilaterally Abdomen: Soft, nontender, no organomegaly Extremities: Both lower extremities in dressing Neurologic: Alert, oriented x 3, no focal deficit noted   Status is: Inpatient:             Meredeth Ide   [facility] If 7PM-7AM, please contact night-coverage at [email or web address], Office  [phone]   08/05/2023, 8:19 AM  LOS: 2 days

## 2023-08-05 NOTE — Progress Notes (Signed)
Amy Moses Progress Note   Subjective:  Seen in room - she denies CP/dyspnea. For HD today. S/p LE angio yesterday - no interventions, optimized from their standpoint to heal wound. ID consulted - they are questioning if OM - ESR normal. Their plan is to stop abx until Dr. Lajoyce Corners can assess next week.  Objective Vitals:   08/04/23 2300 08/05/23 0013 08/05/23 0500 08/05/23 0737  BP:      Pulse:  77    Resp:  16 20   Temp: 98 F (36.7 C)  98.9 F (37.2 C) 98.4 F (36.9 C)  TempSrc: Oral  Oral Oral  SpO2:  96%    Weight:      Height:       Physical Exam General: Overweight, well appearing woman, NAD. Room air Heart: RRR; no murmur Lungs: L lung clear, R side with scattered rales Abdomen: soft Extremities: BLE bandaged to knee; no thigh edema Dialysis Access: LUE AVF + bruit  Additional Objective Labs: Basic Metabolic Panel: Recent Labs  Lab 08/02/23 1438 08/03/23 0600  NA 134* 135  K 4.1 3.8  CL 99 100  CO2 24 21*  GLUCOSE 157* 223*  BUN 37* 43*  CREATININE 6.21* 6.92*  CALCIUM 7.8* 7.8*  PHOS  --  6.7*   Liver Function Tests: Recent Labs  Lab 08/02/23 1435 08/02/23 1438 08/03/23 0600  AST 14* 13* 11*  ALT 15 15 13   ALKPHOS 134* 131* 117  BILITOT 1.0 0.9 0.6  PROT 7.1 7.0 6.1*  ALBUMIN 4.0 4.0 3.3*   CBC: Recent Labs  Lab 08/02/23 1438 08/03/23 0600 08/04/23 0558  WBC 7.8 6.4 6.7  NEUTROABS 6.0  --   --   HGB 11.4* 9.9* 10.9*  HCT 36.7 32.7* 35.7*  MCV 95.6 93.2 93.5  PLT 86* 75* 95*   Studies/Results: MR HEEL RIGHT WO CONTRAST  Result Date: 08/04/2023 CLINICAL DATA:  Evaluate for osteomyelitis. EXAM: MR OF THE RIGHT HEEL WITHOUT CONTRAST TECHNIQUE: Multiplanar, multisequence MR imaging of the right ankle was performed. No intravenous contrast was administered. COMPARISON:  Foot MRI 08/03/2023 FINDINGS: Prior ORIF of the distal fibula and distal tibia with orthopedic hardware resulting in susceptibility artifact partially  obscuring the soft tissue and osseous structures. TENDONS Peroneal: Peroneal longus tendon intact. Peroneal brevis intact. Posteromedial: Posterior tibial tendon intact. Flexor hallucis longus tendon intact. Flexor digitorum longus tendon intact. Anterior: Tibialis anterior tendon intact. Extensor hallucis longus tendon intact Extensor digitorum longus tendon intact. Achilles:  Mild tendinosis of the Achilles tendon. Plantar Fascia: Intact. LIGAMENTS Lateral: Anterior talofibular ligament intact. Calcaneofibular ligament intact. Posterior talofibular ligament intact. Anterior and posterior tibiofibular ligaments intact. Medial: Deltoid ligament intact. Spring ligament intact. CARTILAGE Ankle Joint: No joint effusion. Normal ankle mortise. High-grade partial-thickness cartilage loss of the tibiotalar joint with areas of full-thickness cartilage loss. Subtalar Joints/Sinus Tarsi: Normal subtalar joints. No subtalar joint effusion. Normal sinus tarsi. Bones: No aggressive osseous lesion. No fracture or dislocation. Mild joint space narrowing throughout the midfoot. Soft tissue wound along the plantar lateral aspect of the cuboid with subcortical bone marrow edema consistent with osteomyelitis. Soft Tissue: No fluid collection or hematoma. Generalized muscle atrophy. Tarsal tunnel is normal. IMPRESSION: 1. Soft tissue wound along the plantar lateral aspect of the cuboid with subcortical bone marrow edema consistent with osteomyelitis. 2. Moderate-severe osteoarthritis of the tibiotalar joint. Electronically Signed   By: Elige Ko M.D.   On: 08/04/2023 11:24   VAS [name] ABI WITH/WO TBI  Result Date: 08/03/2023  LOWER EXTREMITY DOPPLER STUDY Patient Name:  Amy Moses  Date of Exam:   08/03/2023 Medical Rec #: [id number]        Accession #:    [id number] Date of Birth: Sep 30, 1971       Patient Gender: F Patient Age:   52 years Exam Location:  [name] Procedure:      VAS [name] ABI WITH/WO TBI Referring Phys:  ANASTASSIA DOUTOVA --------------------------------------------------------------------------------  Indications: Ulceration. High Risk Factors: Hypertension, hyperlipidemia, Diabetes.  Limitations: Today's exam was limited due to bandages , an open wound and              Bilateral skin induration, Left restricted arm. Comparison Study: No prior studies. Performing Technologist: Olen Cordial RVT  Examination Guidelines: A complete evaluation includes at minimum, Doppler waveform signals and systolic blood pressure reading at the level of bilateral brachial, anterior tibial, and posterior tibial arteries, when vessel segments are accessible. Bilateral testing is considered an integral part of a complete examination. Photoelectric Plethysmograph (PPG) waveforms and toe systolic pressure readings are included as required and additional duplex testing as needed. Limited examinations for reoccurring indications may be performed as noted.  ABI Findings: +---------+------------------+-----+----------+--------+ Right    Rt Pressure (mmHg)IndexWaveform  Comment  +---------+------------------+-----+----------+--------+ Brachial 129                    triphasic          +---------+------------------+-----+----------+--------+ PTA      254               1.97 monophasic         +---------+------------------+-----+----------+--------+ DP       254               1.97 monophasic         +---------+------------------+-----+----------+--------+ Great Toe55                0.43                    +---------+------------------+-----+----------+--------+ +--------+------------------+-----+------------------+----------+ Left    Lt Pressure (mmHg)IndexWaveform          Comment    +--------+------------------+-----+------------------+----------+ Brachial                                         Restricted +--------+------------------+-----+------------------+----------+ PTA                             Unable to insonateInduration +--------+------------------+-----+------------------+----------+ DP      254               1.97                              +--------+------------------+-----+------------------+----------+ +-------+-----------+-----------+------------+------------+ ABI/TBIToday's ABIToday's TBIPrevious ABIPrevious TBI +-------+-----------+-----------+------------+------------+ Right  [place]         0.43                                +-------+-----------+-----------+------------+------------+ Left   [place]                                             +-------+-----------+-----------+------------+------------+  Summary: Right: Resting right ankle-brachial index indicates noncompressible right lower extremity arteries. The right toe-brachial index is abnormal. Left: Resting left ankle-brachial index indicates noncompressible left lower extremity arteries. Unable to obtain TBI due to great toe girth. *See table(s) above for measurements and observations.  Electronically signed by Heath Lark on 08/03/2023 at 10:52:38 PM.    Final    MR FOOT RIGHT WO CONTRAST  Result Date: 08/03/2023 CLINICAL DATA:  Diabetic with nonhealing foot wound. Soft tissue infection suspected. Previous 5th ray amputation. EXAM: MRI OF THE RIGHT FOREFOOT WITHOUT CONTRAST TECHNIQUE: Multiplanar, multisequence MR imaging of the right forefoot was performed. No intravenous contrast was administered. COMPARISON:  Radiographs 08/02/2023 and 05/10/2023. Previous MRI of the right foot and lower leg 05/02/2022. FINDINGS: Bones/Joint/Cartilage Today's examination was performed with a small field-of-view and concentrates on the forefoot. The midfoot and hindfoot are incompletely visualized. Previous amputation of the 5th ray. No acute osseous findings or evidence of osteomyelitis within the forefoot. There are old healed fractures of the 2nd and 3rd metatarsal shafts. There are advanced degenerative changes at the  1st metatarsophalangeal joint with a moderate hallux valgus deformity. No significant joint effusions. The alignment appears normal at the Lisfranc joint. Short axis axial and sagittal images partially image the cuboid. As seen on the previous MRI, there is marrow T2 hyperintensity along the inferolateral aspect of the cuboid adjacent to an overlying skin defect, suspicious for osteomyelitis. This is incompletely visualized on this examination. Ligaments Intact Lisfranc ligament. The collateral ligaments of the metatarsophalangeal joints appear intact. Muscles and Tendons Generalized forefoot muscular atrophy. The forefoot tendons appear intact, without significant tenosynovitis. Soft tissues Heterogeneous fat saturation secondary to the postsurgical changes in the distal lower leg. There is diffuse dorsal subcutaneous edema without focal fluid collection. As described above, there is a persistent soft tissue ulcer along the inferolateral aspect of the cuboid, incompletely visualized on this examination of the forefoot. IMPRESSION: 1. Persistent soft tissue ulcer along the inferolateral aspect of the cuboid with underlying marrow changes in the cuboid, suspicious for osteomyelitis. This is incompletely visualized on this examination of the forefoot. Recommend follow-up imaging of the hindfoot and midfoot. 2. No evidence of osteomyelitis within the forefoot. 3. Advanced degenerative changes at the 1st metatarsophalangeal joint with a moderate hallux valgus deformity. 4. Diffuse dorsal subcutaneous edema without focal fluid collection. 5. Old healed fractures of the 2nd and 3rd metatarsal shafts. Previous amputation of the 5th ray. Electronically Signed   By: Carey Bullocks M.D.   On: 08/03/2023 14:51   Medications:  sodium chloride     sodium chloride      aspirin EC  81 mg Oral Daily   atorvastatin  10 mg Oral QPM   Chlorhexidine Gluconate Cloth  6 each Topical Q0600   feeding supplement (NEPRO CARB STEADY)   237 mL Oral BID BM   ferric citrate  630 mg Oral TID WC   insulin aspart  0-6 Units Subcutaneous Q4H   insulin glargine-yfgn  5 Units Subcutaneous QHS   levothyroxine  75 mcg Oral Daily   midodrine  10 mg Oral Daily   multivitamin  1 tablet Oral QHS   nutrition supplement (JUVEN)  1 packet Oral BID BM   pantoprazole  40 mg Oral BID   sodium chloride flush  3 mL Intravenous Q12H   sodium chloride flush  3 mL Intravenous Q12H    Dialysis Orders: [name]  3:45hr, 400/800, EDW 92.5kg, 2K/2.5Ca, AVF, no heparin - Calcitriol  0.69mcg PO q HD - Sensipar 90mg  PO q HD - Mircera IV q 2 weeks - Venofer 100mg  x 10 (in middle of course)   Assessment/Plan:  R foot wound/?osteomyelitis (per imaging only): On Ceftriaxone + Flagyl at this time. Ortho consult pending. [facility] consulted, s/p LE angiography - occluded R ATA but otherwise open - no interventions. ID involved - ESR only 7 - they don't think this is OM - holding abx for now. Hx calciphylaxis: Reports no new wounds recently except for her foot which she reports is due to DM, no longer on sodium thiosulfate ESRD:  TTS schedule -> HD today. No heparin (recent GI bleed). Hypotension/volume: BP soft, takes midodrine pre-HD. Chronic LE edema. Rales in R lung - asymptomatic. Will get imaging if still present after next HD  Anemia: Hgb 10.9 - hold off on ESA for now.  Metabolic bone disease: Phos 6.7, continue binders. Calcium slightly low Hx calciphylaxis - do not supplement.  Nutrition: Alb 3.3, will need renal diet/protein supplement and fluid restriction T2DM: On insulin, per admitting team  Ozzie Hoyle, PA-C 08/05/2023, 8:51 AM  [facility]

## 2023-08-05 NOTE — Progress Notes (Signed)
[facility]  Subjective  -no complaints.   Objective 108/71 77 98.4 F (36.9 C) (Oral) 20 96%  Intake/Output Summary (Last 24 hours) at 08/05/2023 0922 Last data filed at 08/05/2023 0644 Gross per 24 hour  Intake 460 ml  Output --  Net 460 ml    Left groin with no hematoma  Laboratory Lab Results: Recent Labs    08/03/23 0600 08/04/23 0558  WBC 6.4 6.7  HGB 9.9* 10.9*  HCT 32.7* 35.7*  PLT 75* 95*   BMET Recent Labs    08/02/23 1438 08/03/23 0600  NA 134* 135  K 4.1 3.8  CL 99 100  CO2 24 21*  GLUCOSE 157* 223*  BUN 37* 43*  CREATININE 6.21* 6.92*  CALCIUM 7.8* 7.8*    COAG Lab Results  Component Value Date   INR 1.1 03/15/2023   INR 1.1 10/01/2022   INR 1.2 04/29/2022   No results found for: "PTT"  Assessment/Planning:  52 year old female underwent left common femoral access for right lower extremity angiogram yesterday with Dr. Lenell Antu for tissue loss.  Patient has inline flow and is optimized from vascular surgery.  Left groin looks fine.  Followed by Dr. Lajoyce Corners for her wounds.  We will sign off.  Call with questions or concerns.  Cephus Shelling 08/05/2023 9:22 AM --

## 2023-08-06 DIAGNOSIS — N186 End stage renal disease: Secondary | ICD-10-CM | POA: Diagnosis not present

## 2023-08-06 DIAGNOSIS — L97519 Non-pressure chronic ulcer of other part of right foot with unspecified severity: Secondary | ICD-10-CM | POA: Diagnosis not present

## 2023-08-06 DIAGNOSIS — M86171 Other acute osteomyelitis, right ankle and foot: Secondary | ICD-10-CM | POA: Diagnosis not present

## 2023-08-06 DIAGNOSIS — E039 Hypothyroidism, unspecified: Secondary | ICD-10-CM | POA: Diagnosis not present

## 2023-08-06 LAB — GLUCOSE, CAPILLARY
Glucose-Capillary: 247 mg/dL — ABNORMAL HIGH (ref 70–99)
Glucose-Capillary: 253 mg/dL — ABNORMAL HIGH (ref 70–99)
Glucose-Capillary: 168 mg/dL — ABNORMAL HIGH (ref 70–99)
Glucose-Capillary: 188 mg/dL — ABNORMAL HIGH (ref 70–99)
Glucose-Capillary: 155 mg/dL — ABNORMAL HIGH (ref 70–99)

## 2023-08-06 MED ORDER — INSULIN GLARGINE-YFGN 100 UNIT/ML ~~LOC~~ SOLN
10.0000 [IU] | Freq: Every day | SUBCUTANEOUS | Status: DC
  Administered 2023-08-06 – 2023-08-07 (×2): 10 [IU] via SUBCUTANEOUS
  Filled 2023-08-06 (×3): qty 0.1

## 2023-08-06 NOTE — Progress Notes (Signed)
PHARMACIST LIPID MONITORING   Amy Moses is a 52 y.o. female admitted on 08/02/2023 with right foot wound.  Pharmacy has been consulted to optimize lipid-lowering therapy with the indication of secondary prevention for clinical ASCVD.  Recent Labs:  Lipid Panel (last 6 months):   Lab Results  Component Value Date   CHOL 56 08/05/2023   TRIG 36 08/05/2023   HDL 36 (L) 08/05/2023   CHOLHDL 1.6 08/05/2023   VLDL 7 08/05/2023   LDLCALC 13 08/05/2023    Hepatic function panel (last 6 months):   Lab Results  Component Value Date   AST 11 (L) 08/03/2023   ALT 13 08/03/2023   ALKPHOS 117 08/03/2023   BILITOT 0.6 08/03/2023   BILIDIR 0.3 (H) 08/02/2023   IBILI 0.7 08/02/2023    SCr (since admission):   Serum creatinine: 7.73 mg/dL (H) 30/86/57 8469 Estimated creatinine clearance: 10 mL/min (A)  Current therapy and lipid therapy tolerance Current lipid-lowering therapy: atorvastatin 10 mg  Previous lipid-lowering therapies (if applicable): N/a Documented or reported allergies or intolerances to lipid-lowering therapies (if applicable): N/a  Assessment:   No change indicated at this time  Plan:    1.Already on Atorvastatin 10 mg and LDL is controlled  2.Add ezetimibe (if any one of the following):   Not indicated at this time.  3.Refer to lipid clinic:   No  4.Follow-up with:  Primary care provider - Lorelei Pont, DO  5.Follow-up labs after discharge:  No changes in lipid therapy, repeat a lipid panel in one year.       Griffin Dakin, PharmD 08/06/2023, 12:20 PM

## 2023-08-06 NOTE — Plan of Care (Signed)
Patient ID: Amy Moses, female   DOB: August 19, 1971, 52 y.o.   MRN: [id number]  Problem: Education: Goal: Ability to describe self-care measures that may prevent or decrease complications (Diabetes Survival Skills Education) will improve 08/06/2023 1635 by Lidia Collum, RN Outcome: Progressing 08/06/2023 1626 by Lidia Collum, RN Outcome: Progressing Goal: Individualized Educational Video(s) 08/06/2023 1635 by Lidia Collum, RN Outcome: Progressing 08/06/2023 1626 by Pia Mau D, RN Outcome: Progressing   Problem: Coping: Goal: Ability to adjust to condition or change in health will improve 08/06/2023 1635 by Lidia Collum, RN Outcome: Progressing 08/06/2023 1626 by Lidia Collum, RN Outcome: Progressing   Problem: Fluid Volume: Goal: Ability to maintain a balanced intake and output will improve 08/06/2023 1635 by Lidia Collum, RN Outcome: Progressing 08/06/2023 1626 by Lidia Collum, RN Outcome: Progressing   Problem: Health Behavior/Discharge Planning: Goal: Ability to identify and utilize available resources and services will improve 08/06/2023 1635 by Lidia Collum, RN Outcome: Progressing 08/06/2023 1626 by Pia Mau D, RN Outcome: Progressing Goal: Ability to manage health-related needs will improve 08/06/2023 1635 by Lidia Collum, RN Outcome: Progressing 08/06/2023 1626 by Pia Mau D, RN Outcome: Progressing   Problem: Metabolic: Goal: Ability to maintain appropriate glucose levels will improve 08/06/2023 1635 by Lidia Collum, RN Outcome: Progressing 08/06/2023 1626 by Pia Mau D, RN Outcome: Progressing   Problem: Nutritional: Goal: Maintenance of adequate nutrition will improve 08/06/2023 1635 by Lidia Collum, RN Outcome: Progressing 08/06/2023 1626 by Pia Mau D, RN Outcome: Progressing Goal: Progress toward achieving an optimal weight will improve 08/06/2023 1635 by Lidia Collum,  RN Outcome: Progressing 08/06/2023 1626 by Pia Mau D, RN Outcome: Progressing   Problem: Skin Integrity: Goal: Risk for impaired skin integrity will decrease 08/06/2023 1635 by Lidia Collum, RN Outcome: Progressing 08/06/2023 1626 by Pia Mau D, RN Outcome: Progressing   Problem: Tissue Perfusion: Goal: Adequacy of tissue perfusion will improve 08/06/2023 1635 by Lidia Collum, RN Outcome: Progressing 08/06/2023 1626 by Lidia Collum, RN Outcome: Progressing   Problem: Education: Goal: Knowledge of General Education information will improve Description: Including pain rating scale, medication(s)/side effects and non-pharmacologic comfort measures 08/06/2023 1635 by Lidia Collum, RN Outcome: Progressing 08/06/2023 1626 by Lidia Collum, RN Outcome: Progressing   Problem: Health Behavior/Discharge Planning: Goal: Ability to manage health-related needs will improve 08/06/2023 1635 by Lidia Collum, RN Outcome: Progressing 08/06/2023 1626 by Pia Mau D, RN Outcome: Progressing   Problem: Clinical Measurements: Goal: Ability to maintain clinical measurements within normal limits will improve 08/06/2023 1635 by Lidia Collum, RN Outcome: Progressing 08/06/2023 1626 by Pia Mau D, RN Outcome: Progressing Goal: Will remain free from infection 08/06/2023 1635 by Lidia Collum, RN Outcome: Progressing 08/06/2023 1626 by Pia Mau D, RN Outcome: Progressing Goal: Diagnostic test results will improve 08/06/2023 1635 by Lidia Collum, RN Outcome: Progressing 08/06/2023 1626 by Pia Mau D, RN Outcome: Progressing Goal: Respiratory complications will improve 08/06/2023 1635 by Lidia Collum, RN Outcome: Progressing 08/06/2023 1626 by Pia Mau D, RN Outcome: Progressing Goal: Cardiovascular complication will be avoided 08/06/2023 1635 by Lidia Collum, RN Outcome: Progressing 08/06/2023 1626 by  Lidia Collum, RN Outcome: Progressing   Problem: Activity: Goal: Risk for activity intolerance will decrease 08/06/2023 1635 by Lidia Collum, RN Outcome: Progressing 08/06/2023 1626 by Pia Mau D, RN Outcome: Progressing   Problem: Nutrition: Goal: Adequate nutrition will be maintained 08/06/2023 1635 by  Pia Mau D, RN Outcome: Progressing 08/06/2023 1626 by Pia Mau D, RN Outcome: Progressing   Problem: Coping: Goal: Level of anxiety will decrease 08/06/2023 1635 by Lidia Collum, RN Outcome: Progressing 08/06/2023 1626 by Pia Mau D, RN Outcome: Progressing   Problem: Elimination: Goal: Will not experience complications related to bowel motility 08/06/2023 1635 by Lidia Collum, RN Outcome: Progressing 08/06/2023 1626 by Pia Mau D, RN Outcome: Progressing Goal: Will not experience complications related to urinary retention 08/06/2023 1635 by Lidia Collum, RN Outcome: Progressing 08/06/2023 1626 by Lidia Collum, RN Outcome: Progressing   Problem: Pain Managment: Goal: General experience of comfort will improve 08/06/2023 1635 by Lidia Collum, RN Outcome: Progressing 08/06/2023 1626 by Pia Mau D, RN Outcome: Progressing   Problem: Safety: Goal: Ability to remain free from injury will improve 08/06/2023 1635 by Lidia Collum, RN Outcome: Progressing 08/06/2023 1626 by Pia Mau D, RN Outcome: Progressing   Problem: Skin Integrity: Goal: Risk for impaired skin integrity will decrease 08/06/2023 1635 by Lidia Collum, RN Outcome: Progressing 08/06/2023 1626 by Pia Mau D, RN Outcome: Progressing   Problem: Education: Goal: Understanding of CV disease, CV risk reduction, and recovery process will improve 08/06/2023 1635 by Lidia Collum, RN Outcome: Progressing 08/06/2023 1626 by Lidia Collum, RN Outcome: Progressing Goal: Individualized Educational Video(s) 08/06/2023  1635 by Lidia Collum, RN Outcome: Progressing 08/06/2023 1626 by Lidia Collum, RN Outcome: Progressing   Problem: Activity: Goal: Ability to return to baseline activity level will improve 08/06/2023 1635 by Lidia Collum, RN Outcome: Progressing 08/06/2023 1626 by Lidia Collum, RN Outcome: Progressing   Problem: Cardiovascular: Goal: Ability to achieve and maintain adequate cardiovascular perfusion will improve 08/06/2023 1635 by Lidia Collum, RN Outcome: Progressing 08/06/2023 1626 by Pia Mau D, RN Outcome: Progressing Goal: Vascular access site(s) Level 0-1 will be maintained 08/06/2023 1635 by Lidia Collum, RN Outcome: Progressing 08/06/2023 1626 by Lidia Collum, RN Outcome: Progressing   Problem: Health Behavior/Discharge Planning: Goal: Ability to safely manage health-related needs after discharge will improve 08/06/2023 1635 by Lidia Collum, RN Outcome: Progressing 08/06/2023 1626 by Lidia Collum, RN Outcome: Progressing   Lidia Collum, RN

## 2023-08-06 NOTE — Progress Notes (Signed)
   08/06/23 0017  BiPAP/CPAP/SIPAP  BiPAP/CPAP/SIPAP Pt Type Adult  BiPAP/CPAP/SIPAP Resmed  Mask Type Nasal pillows  Mask Size Medium  Patient Home Equipment Yes  Safety Check Completed by RT for Home Unit Yes, no issues noted  BiPAP/CPAP /SiPAP Vitals  Resp 14  MEWS Score/Color  MEWS Score 1  MEWS Score Color [place] Si

## 2023-08-06 NOTE — Plan of Care (Signed)
Patient ID: Amy Moses, female   DOB: Jan 21, 1971, 52 y.o.   MRN: [id number]  Problem: Education: Goal: Ability to describe self-care measures that may prevent or decrease complications (Diabetes Survival Skills Education) will improve 08/06/2023 1626 by Lidia Collum, RN Outcome: Progressing 08/06/2023 1626 by Lidia Collum, RN Outcome: Progressing Goal: Individualized Educational Video(s) 08/06/2023 1626 by Lidia Collum, RN Outcome: Progressing 08/06/2023 1626 by Pia Mau D, RN Outcome: Progressing   Problem: Coping: Goal: Ability to adjust to condition or change in health will improve 08/06/2023 1626 by Lidia Collum, RN Outcome: Progressing 08/06/2023 1626 by Lidia Collum, RN Outcome: Progressing   Problem: Fluid Volume: Goal: Ability to maintain a balanced intake and output will improve 08/06/2023 1626 by Lidia Collum, RN Outcome: Progressing 08/06/2023 1626 by Lidia Collum, RN Outcome: Progressing   Problem: Health Behavior/Discharge Planning: Goal: Ability to identify and utilize available resources and services will improve 08/06/2023 1626 by Lidia Collum, RN Outcome: Progressing 08/06/2023 1626 by Lidia Collum, RN Outcome: Progressing Goal: Ability to manage health-related needs will improve 08/06/2023 1626 by Lidia Collum, RN Outcome: Progressing 08/06/2023 1626 by Lidia Collum, RN Outcome: Progressing   Problem: Metabolic: Goal: Ability to maintain appropriate glucose levels will improve Outcome: Progressing   Problem: Nutritional: Goal: Maintenance of adequate nutrition will improve Outcome: Progressing Goal: Progress toward achieving an optimal weight will improve Outcome: Progressing   Problem: Skin Integrity: Goal: Risk for impaired skin integrity will decrease Outcome: Progressing   Problem: Tissue Perfusion: Goal: Adequacy of tissue perfusion will improve Outcome: Progressing   Problem:  Education: Goal: Knowledge of General Education information will improve Description: Including pain rating scale, medication(s)/side effects and non-pharmacologic comfort measures Outcome: Progressing   Problem: Health Behavior/Discharge Planning: Goal: Ability to manage health-related needs will improve Outcome: Progressing   Problem: Clinical Measurements: Goal: Ability to maintain clinical measurements within normal limits will improve Outcome: Progressing Goal: Will remain free from infection Outcome: Progressing Goal: Diagnostic test results will improve Outcome: Progressing Goal: Respiratory complications will improve Outcome: Progressing Goal: Cardiovascular complication will be avoided Outcome: Progressing   Problem: Activity: Goal: Risk for activity intolerance will decrease Outcome: Progressing   Problem: Nutrition: Goal: Adequate nutrition will be maintained Outcome: Progressing   Problem: Coping: Goal: Level of anxiety will decrease Outcome: Progressing   Problem: Elimination: Goal: Will not experience complications related to bowel motility Outcome: Progressing Goal: Will not experience complications related to urinary retention Outcome: Progressing   Problem: Pain Managment: Goal: General experience of comfort will improve Outcome: Progressing   Problem: Safety: Goal: Ability to remain free from injury will improve Outcome: Progressing   Problem: Skin Integrity: Goal: Risk for impaired skin integrity will decrease Outcome: Progressing   Problem: Education: Goal: Understanding of CV disease, CV risk reduction, and recovery process will improve Outcome: Progressing Goal: Individualized Educational Video(s) Outcome: Progressing   Problem: Activity: Goal: Ability to return to baseline activity level will improve Outcome: Progressing   Problem: Cardiovascular: Goal: Ability to achieve and maintain adequate cardiovascular perfusion will  improve Outcome: Progressing Goal: Vascular access site(s) Level 0-1 will be maintained Outcome: Progressing   Problem: Health Behavior/Discharge Planning: Goal: Ability to safely manage health-related needs after discharge will improve Outcome: Progressing  Lidia Collum, RN

## 2023-08-06 NOTE — Progress Notes (Signed)
[facility] Progress Note   Subjective:  Seen in room - feels ok. No CP/dyspnea. HD went fine yesterday - 2.5L off.  Objective Vitals:   08/05/23 1945 08/05/23 2309 08/06/23 0017 08/06/23 0331  BP: 103/62 (!) 94/56  112/73  Pulse: 76 74  70  Resp: 15 17 14 17   Temp: 98.2 F (36.8 C) 98.2 F (36.8 C)  97.8 F (36.6 C)  TempSrc: Oral Oral  Oral  SpO2: 95% 93%  98%  Weight:      Height:       Physical Exam General: Overweight, well appearing woman, NAD. Room air Heart: RRR; no murmur Lungs: CTA anteriorly Abdomen: soft Extremities: BLE bandaged to knee; no thigh edema Dialysis Access: LUE AVF + bruit  Additional Objective Labs: Basic Metabolic Panel: Recent Labs  Lab 08/02/23 1438 08/03/23 0600 08/05/23 1130  NA 134* 135 133*  K 4.1 3.8 4.6  CL 99 100 99  CO2 24 21* 21*  GLUCOSE 157* 223* 198*  BUN 37* 43* 59*  CREATININE 6.21* 6.92* 7.73*  CALCIUM 7.8* 7.8* 8.3*  PHOS  --  6.7* 6.2*   Liver Function Tests: Recent Labs  Lab 08/02/23 1435 08/02/23 1438 08/03/23 0600 08/05/23 1130  AST 14* 13* 11*  --   ALT 15 15 13   --   ALKPHOS 134* 131* 117  --   BILITOT 1.0 0.9 0.6  --   PROT 7.1 7.0 6.1*  --   ALBUMIN 4.0 4.0 3.3* 3.5   CBC: Recent Labs  Lab 08/02/23 1438 08/03/23 0600 08/04/23 0558 08/05/23 1130  WBC 7.8 6.4 6.7 7.8  NEUTROABS 6.0  --   --   --   HGB 11.4* 9.9* 10.9* 10.8*  HCT 36.7 32.7* 35.7* 34.3*  MCV 95.6 93.2 93.5 95.0  PLT 86* 75* 95* 106*   Studies/Results: MR HEEL RIGHT WO CONTRAST  Result Date: 08/04/2023 CLINICAL DATA:  Evaluate for osteomyelitis. EXAM: MR OF THE RIGHT HEEL WITHOUT CONTRAST TECHNIQUE: Multiplanar, multisequence MR imaging of the right ankle was performed. No intravenous contrast was administered. COMPARISON:  Foot MRI 08/03/2023 FINDINGS: Prior ORIF of the distal fibula and distal tibia with orthopedic hardware resulting in susceptibility artifact partially obscuring the soft tissue and osseous  structures. TENDONS Peroneal: Peroneal longus tendon intact. Peroneal brevis intact. Posteromedial: Posterior tibial tendon intact. Flexor hallucis longus tendon intact. Flexor digitorum longus tendon intact. Anterior: Tibialis anterior tendon intact. Extensor hallucis longus tendon intact Extensor digitorum longus tendon intact. Achilles:  Mild tendinosis of the Achilles tendon. Plantar Fascia: Intact. LIGAMENTS Lateral: Anterior talofibular ligament intact. Calcaneofibular ligament intact. Posterior talofibular ligament intact. Anterior and posterior tibiofibular ligaments intact. Medial: Deltoid ligament intact. Spring ligament intact. CARTILAGE Ankle Joint: No joint effusion. Normal ankle mortise. High-grade partial-thickness cartilage loss of the tibiotalar joint with areas of full-thickness cartilage loss. Subtalar Joints/Sinus Tarsi: Normal subtalar joints. No subtalar joint effusion. Normal sinus tarsi. Bones: No aggressive osseous lesion. No fracture or dislocation. Mild joint space narrowing throughout the midfoot. Soft tissue wound along the plantar lateral aspect of the cuboid with subcortical bone marrow edema consistent with osteomyelitis. Soft Tissue: No fluid collection or hematoma. Generalized muscle atrophy. Tarsal tunnel is normal. IMPRESSION: 1. Soft tissue wound along the plantar lateral aspect of the cuboid with subcortical bone marrow edema consistent with osteomyelitis. 2. Moderate-severe osteoarthritis of the tibiotalar joint. Electronically Signed   By: Elige Ko M.D.   On: 08/04/2023 11:24   Medications:  sodium chloride  sodium chloride      aspirin EC  81 mg Oral Daily   atorvastatin  10 mg Oral QPM   feeding supplement (NEPRO CARB STEADY)  237 mL Oral BID BM   ferric citrate  630 mg Oral TID WC   insulin aspart  0-6 Units Subcutaneous Q4H   insulin glargine-yfgn  5 Units Subcutaneous QHS   levothyroxine  75 mcg Oral Daily   midodrine  10 mg Oral Daily   multivitamin   1 tablet Oral QHS   nutrition supplement (JUVEN)  1 packet Oral BID BM   pantoprazole  40 mg Oral BID   sodium chloride flush  3 mL Intravenous Q12H   sodium chloride flush  3 mL Intravenous Q12H    Dialysis Orders: [name]  3:45hr, 400/800, EDW 92.5kg, 2K/2.5Ca, AVF, no heparin - Calcitriol 0.68mcg PO q HD - Sensipar 90mg  PO q HD - Mircera IV q 2 weeks - Venofer 100mg  x 10 (in middle of course)   Assessment/Plan:  R foot wound/?osteomyelitis (per imaging only): Initially started on Ceftriaxone + Flagyl. Ortho consult pending. [facility] consulted, s/p LE angiography - occluded R ATA but otherwise open - no interventions. ID involved - ESR only 7 - they don't think this is OM - abx on hold for now. Ortho Lajoyce Corners) to assess her tomorrow. Hx calciphylaxis: Reports no new wounds recently except for her foot which she reports is due to DM, no longer on sodium thiosulfate ESRD:  TTS schedule -> next HD 9/17 Hypotension/volume: BP soft, on midodrine daily here. Chronic LE edema. Now below prior EDW - lower on discharge.  Anemia: Hgb 10.8 - hold off on ESA for now.  Metabolic bone disease: Phos 6.2, continue binders. Calcium slightly low Hx calciphylaxis - do not supplement.  Nutrition: Alb 3.5 - improving. Continue supps. T2DM: On insulin, per admitting team  Ozzie Hoyle, PA-C 08/06/2023, 8:58 AM  [facility]

## 2023-08-06 NOTE — Progress Notes (Signed)
[facility]  PROGRESS NOTE  Rodolfo Yoakam KGM:[id number] DOB: 1971/04/10 DOA: 08/02/2023 PCP: Lorelei Pont, DO   Brief HPI:   52 y.o. female with medical history significant of DM2, ESRD, chronic foot infection, calciphylaxis,  Presented with  right foot wound She has history of nonhealing foot ulcers in the past as well as calciphylaxis And end-stage diabetes last hemodialysis was yesterday she has home health nurse who has been doing compression dressings to her legs but this morning the wound care nurse seen her and was concerned because it was more draining and her foot appears to be red no fevers or chills patient is diabetic neuropathy. Orthopedics, ID and vascular surgery consulted   Assessment/Plan:   Ischemic ulcer of right foot (HCC) -Draining right foot ulcer -Orthopedics consulted, recommended to get ABIs and vascular surgery consultation -ABIs obtained underwent left common femoral access for right lower extremity angiogram; optimized for surgery as per vascular - MRI right foot completed.MRI suggestive of cuboid edema next to the lateral ulcer.  -Antibiotics stopped by ID team till seen by Dr. Lajoyce Corners on Monday -Orthopedics to follow on Monday  OSA (obstructive sleep apnea) Continue with home CPAP.   Obesity (BMI 30-39.9) BMI 34.7   Sacral pressure ulcer Presents on admission. See wound consult note.  Silicone foam for sacrum.   Anemia of chronic kidney failure, stage 5 (HCC) Baseline HgB 9-10 g/dl   Chronic diastolic heart failure (HCC) Does not make urine. Volume status controlled by HD.   ESRD (end stage renal disease) on dialysis [name]) Nephrology consulted for routine HD. Dialyzes via left UE AVF   Acquired hypothyroidism Stable. On synthroid 75 mcg daily.   Primary hypertension Stable.   Mixed hyperlipidemia Stable. On lipitor 10 mg daily.   Type 2 diabetes mellitus with ESRD (end-stage renal disease) (HCC) Stable. On lantus and SSI. -CBG  has been elevated, will change Lantus to 10 mL subcu daily    Medications     aspirin EC  81 mg Oral Daily   atorvastatin  10 mg Oral QPM   feeding supplement (NEPRO CARB STEADY)  237 mL Oral BID BM   ferric citrate  630 mg Oral TID WC   insulin aspart  0-6 Units Subcutaneous Q4H   insulin glargine-yfgn  5 Units Subcutaneous QHS   levothyroxine  75 mcg Oral Daily   midodrine  10 mg Oral Daily   multivitamin  1 tablet Oral QHS   nutrition supplement (JUVEN)  1 packet Oral BID BM   pantoprazole  40 mg Oral BID   sodium chloride flush  3 mL Intravenous Q12H   sodium chloride flush  3 mL Intravenous Q12H     Data Reviewed:   CBG:  Recent Labs  Lab 08/05/23 1620 08/05/23 2025 08/05/23 2335 08/06/23 0335 08/06/23 0807  GLUCAP 137* 211* 239* 247* 253*    SpO2: 98 % O2 Flow Rate (L/min): 2 L/min    Vitals:   08/05/23 1945 08/05/23 2309 08/06/23 0017 08/06/23 0331  BP: 103/62 (!) 94/56  112/73  Pulse: 76 74  70  Resp: 15 17 14 17   Temp: 98.2 F (36.8 C) 98.2 F (36.8 C)  97.8 F (36.6 C)  TempSrc: Oral Oral  Oral  SpO2: 95% 93%  98%  Weight:      Height:          Data Reviewed:  Basic Metabolic Panel: Recent Labs  Lab 08/02/23 1438 08/03/23 0600 08/05/23 1130  NA 134* 135 133*  K  4.1 3.8 4.6  CL 99 100 99  CO2 24 21* 21*  GLUCOSE 157* 223* 198*  BUN 37* 43* 59*  CREATININE 6.21* 6.92* 7.73*  CALCIUM 7.8* 7.8* 8.3*  MG  --  2.2  --   PHOS  --  6.7* 6.2*    CBC: Recent Labs  Lab 08/02/23 1438 08/03/23 0600 08/04/23 0558 08/05/23 1130  WBC 7.8 6.4 6.7 7.8  NEUTROABS 6.0  --   --   --   HGB 11.4* 9.9* 10.9* 10.8*  HCT 36.7 32.7* 35.7* 34.3*  MCV 95.6 93.2 93.5 95.0  PLT 86* 75* 95* 106*    LFT Recent Labs  Lab 08/02/23 1435 08/02/23 1438 08/03/23 0600 08/05/23 1130  AST 14* 13* 11*  --   ALT 15 15 13   --   ALKPHOS 134* 131* 117  --   BILITOT 1.0 0.9 0.6  --   PROT 7.1 7.0 6.1*  --   ALBUMIN 4.0 4.0 3.3* 3.5      Antibiotics: Anti-infectives (From admission, onward)    Start     Dose/Rate Route Frequency Ordered Stop   08/02/23 2315  cefTRIAXone (ROCEPHIN) 2 g in sodium chloride 0.9 % 100 mL IVPB       Placed in "And" Linked Group   2 g 200 mL/hr over 30 Minutes Intravenous Every 24 hours 08/02/23 2313 08/04/23 2359   08/02/23 2315  metroNIDAZOLE (FLAGYL) IVPB 500 mg       Placed in "And" Linked Group   500 mg 100 mL/hr over 60 Minutes Intravenous Every 12 hours 08/02/23 2313 08/04/23 2359        DVT prophylaxis: Not on heparin, recent GI bleed  Code Status: Full code  Family Communication: No family at bedside   CONSULTS nephrology   Subjective   Denies any complaints.  Objective    Physical Examination:   General-appears in no acute distress Heart-S1-S2, regular, no murmur auscultated Lungs-clear to auscultation bilaterally, no wheezing or crackles auscultated Abdomen-soft, nontender, no organomegaly Extremities-trace edema in the lower extremities, both lower extremities in dressing bilaterally Neuro-alert, oriented x3, no focal deficit noted   Status is: Inpatient:          Meredeth Ide   [facility] If 7PM-7AM, please contact night-coverage at [email or web address], Office  [phone]   08/06/2023, 9:09 AM  LOS: 3 days

## 2023-08-07 ENCOUNTER — Encounter (HOSPITAL_COMMUNITY): Payer: Self-pay | Admitting: Vascular Surgery

## 2023-08-07 DIAGNOSIS — L97519 Non-pressure chronic ulcer of other part of right foot with unspecified severity: Secondary | ICD-10-CM

## 2023-08-07 DIAGNOSIS — M86171 Other acute osteomyelitis, right ankle and foot: Secondary | ICD-10-CM

## 2023-08-07 DIAGNOSIS — E039 Hypothyroidism, unspecified: Secondary | ICD-10-CM | POA: Diagnosis not present

## 2023-08-07 DIAGNOSIS — D631 Anemia in chronic kidney disease: Secondary | ICD-10-CM

## 2023-08-07 DIAGNOSIS — N186 End stage renal disease: Secondary | ICD-10-CM | POA: Diagnosis not present

## 2023-08-07 LAB — CULTURE, BLOOD (ROUTINE X 2)
Culture: NO GROWTH
Culture: NO GROWTH
Special Requests: ADEQUATE

## 2023-08-07 LAB — BASIC METABOLIC PANEL
Anion gap: 13 (ref 5–15)
BUN: 61 mg/dL — ABNORMAL HIGH (ref 6–20)
CO2: 23 mmol/L (ref 22–32)
Calcium: 8.8 mg/dL — ABNORMAL LOW (ref 8.9–10.3)
Chloride: 96 mmol/L — ABNORMAL LOW (ref 98–111)
Creatinine, Ser: 7.88 mg/dL — ABNORMAL HIGH (ref 0.44–1.00)
GFR, Estimated: 6 mL/min — ABNORMAL LOW (ref >60)
Glucose, Bld: 200 mg/dL — ABNORMAL HIGH (ref 70–99)
Potassium: 4.4 mmol/L (ref 3.5–5.1)
Sodium: 132 mmol/L — ABNORMAL LOW (ref 135–145)

## 2023-08-07 LAB — GLUCOSE, CAPILLARY
Glucose-Capillary: 148 mg/dL — ABNORMAL HIGH (ref 70–99)
Glucose-Capillary: 170 mg/dL — ABNORMAL HIGH (ref 70–99)
Glucose-Capillary: 172 mg/dL — ABNORMAL HIGH (ref 70–99)
Glucose-Capillary: 175 mg/dL — ABNORMAL HIGH (ref 70–99)
Glucose-Capillary: 187 mg/dL — ABNORMAL HIGH (ref 70–99)
Glucose-Capillary: 275 mg/dL — ABNORMAL HIGH (ref 70–99)

## 2023-08-07 MED ORDER — CHLORHEXIDINE GLUCONATE CLOTH 2 % EX PADS
6.0000 | MEDICATED_PAD | Freq: Every day | CUTANEOUS | Status: DC
  Administered 2023-08-07 – 2023-08-08 (×2): 6 via TOPICAL

## 2023-08-07 MED ORDER — LOPERAMIDE HCL 2 MG PO CAPS
2.0000 mg | ORAL_CAPSULE | ORAL | Status: DC | PRN
  Administered 2023-08-08: 2 mg via ORAL
  Filled 2023-08-07: qty 1

## 2023-08-07 NOTE — TOC Initial Note (Addendum)
Transition of Care [facility]) - Initial/Assessment Note    Patient Details  Name: Amy Moses MRN: [id number] Date of Birth: 1971/06/05  Transition of Care [name]) CM/SW Contact:    Gala Lewandowsky, RN Phone Number: 08/07/2023, 12:54 PM  Clinical Narrative:  Risk for readmission assessment completed.Patient presented for wound infection- ischemic right foot. Patient states she was from home with family support. Patient is currently active with [facility] for RN services for wound care. Agency will need orders and wound care directions post hospital- MD is aware. Per MD, awaiting Ortho consult. Patient has DME CPAP and Wheelchair in the home. Patient reports that she has transportation home. TOC will continue to follow for transition of care needs as the patient progresses.               Expected Discharge Plan: Home w Home Health Services Barriers to Discharge: Continued Medical Work up (Awaiting Ortho consult.)   Patient Goals and CMS Choice Patient states their goals for this hospitalization and ongoing recovery are:: to return home.   Choice offered to / list presented to : NA   Expected Discharge Plan and Services In-house Referral: NA Discharge Planning Services: CM Consult Post Acute Care Choice: Resumption of Svcs/PTA Provider, Home Health Living arrangements for the past 2 months: Single Family Home                 DME Arranged: N/A     HH Arranged: RN HH Agency: [facility] Date HH Agency Contacted: 08/07/23 Time HH Agency Contacted: [id number] Representative spoke with at [facility] Agency: [facility]  Prior Living Arrangements/Services Living arrangements for the past 2 months: Single Family Home Lives with:: Relatives Patient language and need for interpreter reviewed:: Yes Do you feel safe going back to the place where you live?: Yes      Need for Family Participation in Patient Care: Yes (Comment) Care giver support system in place?: Yes  (comment) Current home services: DME (CPAP and Wheelchair.) Criminal Activity/Legal Involvement Pertinent to Current Situation/Hospitalization: No - Comment as needed  Activities of Daily Living Home Assistive Devices/Equipment: Wheelchair, CPAP, CBG Meter, Bedside commode/3-in-1, Eyeglasses, Tub transfer bench, Electric scooter, Hospital bed ADL Screening (condition at time of admission) Patient's cognitive ability adequate to safely complete daily activities?: Yes Is the patient deaf or have difficulty hearing?: No Does the patient have difficulty seeing, even when wearing glasses/contacts?: Yes (blind in right eye, glaucoma) Does the patient have difficulty concentrating, remembering, or making decisions?: No Patient able to express need for assistance with ADLs?: Yes Does the patient have difficulty dressing or bathing?: Yes Independently performs ADLs?: No Communication: Needs assistance Is this a change from baseline?: Pre-admission baseline Dressing (OT): Needs assistance Is this a change from baseline?: Pre-admission baseline Grooming: Independent Feeding: Independent Bathing: Needs assistance Is this a change from baseline?: Pre-admission baseline Toileting: Needs assistance Is this a change from baseline?: Pre-admission baseline In/Out Bed: Independent Walks in Home: Independent with device (comment) Does the patient have difficulty walking or climbing stairs?: Yes Weakness of Legs: Both Weakness of Arms/Hands: Both  Permission Sought/Granted Permission sought to share information with : Case Manager, Magazine features editor, Family Supports       Permission granted to share info w AGENCY: [facility]   Emotional Assessment Appearance:: Appears stated age Attitude/Demeanor/Rapport: Engaged Affect (typically observed): Appropriate Orientation: : Oriented to Self, Oriented to Place, Oriented to  Time, Oriented to Situation Alcohol / Substance Use: Not  Applicable Psych Involvement: No (comment)  Admission diagnosis:  Wound infection [T14.8XXA, L08.9] Ischemic ulcer of right foot (HCC) [L97.519] Patient Active Problem List   Diagnosis Date Noted   Acute osteomyelitis of right foot (HCC) 08/04/2023   OSA (obstructive sleep apnea) 08/02/2023   Prolapsed internal hemorrhoids, grade 3 03/29/2023   Thrombocytopenia (HCC) 03/21/2023   Mitral regurgitation 11/16/2022   Pressure injury of skin 10/01/2022   Pulmonary hypertension, unspecified (HCC) 09/12/2022   GERD (gastroesophageal reflux disease) 04/30/2022   Non-adherence to medical treatment 03/16/2022   Cutaneous abscess of right foot    Osteomyelitis of foot (HCC) 02/14/2022   Obesity (BMI 30-39.9) 01/26/2022   Ischemic ulcer of right foot (HCC) 01/24/2022   Anemia of chronic kidney failure, stage 5 (HCC) 01/24/2022   Sacral pressure ulcer 01/24/2022   Leg wound, left, sequela 12/10/2021   Open leg wound 09/03/2021   Wound infection    Non-pressure chronic ulcer of right calf limited to breakdown of skin (HCC)    Calciphylaxis of right lower extremity with nonhealing ulcer, limited to breakdown of skin (HCC)    Chronic ulcer of left thigh (HCC) 08/07/2021   Calciphylaxis of left lower extremity with nonhealing ulcer with necrosis of muscle (HCC) 08/07/2021   Chronic diastolic heart failure (HCC) 03/30/2021   ESRD (end stage renal disease) on dialysis (HCC) 11/16/2020   Calciphylaxis 11/06/2020   Non-healing open wound of heel 11/03/2020   Diabetic foot infection (HCC) 11/01/2020   Decubitus ulcer, heel 11/01/2020   Closed nondisplaced fracture of left patella 10/29/2020   Acute on chronic renal failure (HCC) 06/10/2020   Anemia of chronic disease 06/10/2020   Vitamin D deficiency 01/28/2019   Type 2 diabetes mellitus with ESRD (end-stage renal disease) (HCC) 09/21/2015   Mixed hyperlipidemia 09/21/2015   Primary hypertension 09/21/2015   Acquired hypothyroidism 09/21/2015    Iris bomb 07/31/2012   Secondary angle-closure glaucoma 07/31/2012   PCP:  Lorelei Pont, DO Pharmacy:   [facility] [phone] - [place], [place] - [address] [place] [address] Phone: [phone] Fax: [phone]  Social Determinants of Health (SDOH) Social History: SDOH Screenings   Food Insecurity: Food Insecurity Present (08/03/2023)  Housing: Low Risk  (08/03/2023)  Transportation Needs: Unmet Transportation Needs (08/03/2023)  Utilities: Not At Risk (08/03/2023)  Depression (PHQ2-9): Low Risk  (04/06/2022)  Tobacco Use: Low Risk  (08/03/2023)   Readmission Risk Interventions    08/07/2023   12:51 PM 03/24/2023    2:57 PM 05/02/2022    9:34 AM  Readmission Risk Prevention Plan  Transportation Screening Complete Complete Complete  PCP or Specialist Appt within 3-5 Days  Not Complete   HRI or Home Care Consult Complete    Social Work Consult for Recovery Care Planning/Counseling Complete Complete   Palliative Care Screening Not Applicable Not Applicable   Medication Review Oceanographer) Complete Complete Complete  HRI or Home Care Consult   Complete  SW Recovery Care/Counseling Consult   Complete  Palliative Care Screening   Not Applicable  Skilled Nursing Facility   Not Applicable

## 2023-08-07 NOTE — Care Management Important Message (Signed)
Important Message  Patient Details  Name: Amy Moses MRN: [id number] Date of Birth: [birth year]   Medicare Important Message Given:  Yes     Sherilyn Banker 08/07/2023, 1:19 PM

## 2023-08-07 NOTE — Progress Notes (Signed)
   08/06/23 2003  BiPAP/CPAP/SIPAP  BiPAP/CPAP/SIPAP Pt Type Adult  BiPAP/CPAP/SIPAP Resmed  Mask Type Nasal pillows  Mask Size Medium  Patient Home Equipment Yes  Safety Check Completed by RT for Home Unit Yes, no issues noted  BiPAP/CPAP /SiPAP Vitals  Temp 98 F (36.7 C)  Pulse Rate 71  Resp 18  BP 95/62  SpO2 96 %  Bilateral Breath Sounds Diminished  MEWS Score/Color  MEWS Score 1  MEWS Score Color Green

## 2023-08-07 NOTE — Progress Notes (Signed)
Mobility Specialist Progress Note:    08/07/23 1500  Mobility  Activity Refused mobility  Mobility Specialist Start Time (ACUTE ONLY) 1451   Pt refused mobility d/t "eating recently and expecting BM". Will f/u as able.   Amy Moses Mobility Specialist Please contact via Special educational needs teacher or Rehab office at [phone]

## 2023-08-07 NOTE — Plan of Care (Signed)
Patient has no acute changes. Will continue to monitor patient

## 2023-08-07 NOTE — Plan of Care (Signed)
  Problem: Coping: Goal: Ability to adjust to condition or change in health will improve Outcome: Progressing   Problem: Metabolic: Goal: Ability to maintain appropriate glucose levels will improve Outcome: Progressing   Problem: Skin Integrity: Goal: Risk for impaired skin integrity will decrease Outcome: Progressing   Problem: Education: Goal: Knowledge of General Education information will improve Description: Including pain rating scale, medication(s)/side effects and non-pharmacologic comfort measures Outcome: Progressing   Problem: Clinical Measurements: Goal: Will remain free from infection Outcome: Progressing Goal: Respiratory complications will improve Outcome: Progressing Goal: Cardiovascular complication will be avoided Outcome: Progressing   Problem: Pain Managment: Goal: General experience of comfort will improve Outcome: Progressing   Problem: Safety: Goal: Ability to remain free from injury will improve Outcome: Progressing

## 2023-08-07 NOTE — Progress Notes (Signed)
[facility] Progress Note   Subjective:  Seen in room - No new events overnight. Comfortable. No complaints.   Objective Vitals:   08/06/23 1456 08/06/23 2003 08/07/23 0400 08/07/23 0837  BP: 114/77 95/62 110/66   Pulse: 73 71 68 74  Resp: 17 18 15    Temp: 97.9 F (36.6 C) 98 F (36.7 C) 98.3 F (36.8 C) 97.8 F (36.6 C)  TempSrc: Axillary Oral Oral Oral  SpO2: 98% 96% 97% 96%  Weight:      Height:       Physical Exam General: Overweight, well appearing woman, NAD. Room air Heart: RRR; no murmur Lungs: CTA anteriorly Abdomen: soft Extremities: BLE bandaged to knee; no thigh edema Dialysis Access: LUE AVF + bruit  Additional Objective Labs: Basic Metabolic Panel: Recent Labs  Lab 08/03/23 0600 08/05/23 1130 08/07/23 0409  NA 135 133* 132*  K 3.8 4.6 4.4  CL 100 99 96*  CO2 21* 21* 23  GLUCOSE 223* 198* 200*  BUN 43* 59* 61*  CREATININE 6.92* 7.73* 7.88*  CALCIUM 7.8* 8.3* 8.8*  PHOS 6.7* 6.2*  --    Liver Function Tests: Recent Labs  Lab 08/02/23 1435 08/02/23 1438 08/03/23 0600 08/05/23 1130  AST 14* 13* 11*  --   ALT 15 15 13   --   ALKPHOS 134* 131* 117  --   BILITOT 1.0 0.9 0.6  --   PROT 7.1 7.0 6.1*  --   ALBUMIN 4.0 4.0 3.3* 3.5   CBC: Recent Labs  Lab 08/02/23 1438 08/03/23 0600 08/04/23 0558 08/05/23 1130  WBC 7.8 6.4 6.7 7.8  NEUTROABS 6.0  --   --   --   HGB 11.4* 9.9* 10.9* 10.8*  HCT 36.7 32.7* 35.7* 34.3*  MCV 95.6 93.2 93.5 95.0  PLT 86* 75* 95* 106*   Studies/Results: No results found. Medications:  sodium chloride     sodium chloride      aspirin EC  81 mg Oral Daily   atorvastatin  10 mg Oral QPM   feeding supplement (NEPRO CARB STEADY)  237 mL Oral BID BM   ferric citrate  630 mg Oral TID WC   insulin aspart  0-6 Units Subcutaneous Q4H   insulin glargine-yfgn  10 Units Subcutaneous QHS   levothyroxine  75 mcg Oral Daily   midodrine  10 mg Oral Daily   multivitamin  1 tablet Oral QHS   nutrition  supplement (JUVEN)  1 packet Oral BID BM   pantoprazole  40 mg Oral BID   sodium chloride flush  3 mL Intravenous Q12H   sodium chloride flush  3 mL Intravenous Q12H    Dialysis Orders: [name] [facility]  3:45hr, 400/800, EDW 92.5kg, 2K/2.5Ca, AVF, no heparin - Calcitriol 0.13mcg PO q HD - Sensipar 90mg  PO q HD - Mircera IV q 2 weeks - Venofer 100mg  x 10 (in middle of course)   Assessment/Plan:  R foot wound/?osteomyelitis (per imaging only): Initially started on Ceftriaxone + Flagyl. Ortho consult pending. VVS consulted, s/p LE angiography - occluded R ATA but otherwise open - no interventions. ID involved - ESR only 7 - they don't think this is OM - abx on hold for now. Ortho Lajoyce Corners) to assess her.  Hx calciphylaxis: Reports no new wounds recently except for her foot which she reports is due to DM, no longer on sodium thiosulfate ESRD:  TTS schedule -> next HD 9/17 Hypotension/volume: BP soft, on midodrine daily here. Chronic LE edema. Now  below prior EDW - lower on discharge.  Anemia: Hgb 10.8 - hold off on ESA for now.  Metabolic bone disease: Phos 6.2, continue binders. Calcium slightly low Hx calciphylaxis - do not supplement.  Nutrition: Alb 3.5 - improving. Continue supps. T2DM: On insulin, per admitting team  Tomasa Blase PA-C  [facility] 08/07/2023,10:07 AM

## 2023-08-07 NOTE — Progress Notes (Signed)
[facility]  PROGRESS NOTE  Amy Moses WJX:[id number] DOB: 1971/10/29 DOA: 08/02/2023 PCP: Lorelei Pont, DO   Brief HPI:   52 y.o. female with medical history significant of DM2, ESRD, chronic foot infection, calciphylaxis,  Presented with  right foot wound She has history of nonhealing foot ulcers in the past as well as calciphylaxis And end-stage diabetes last hemodialysis was yesterday she has home health nurse who has been doing compression dressings to her legs but this morning the wound care nurse seen her and was concerned because it was more draining and her foot appears to be red no fevers or chills patient is diabetic neuropathy. Orthopedics, ID and vascular surgery consulted   Assessment/Plan:   Ischemic ulcer of right foot (HCC) -Draining right foot ulcer -Orthopedics consulted, recommended to get ABIs and vascular surgery consultation -ABIs obtained underwent left common femoral access for right lower extremity angiogram; optimized for surgery as per vascular - MRI right foot completed.MRI suggestive of cuboid edema next to the lateral ulcer.  -Antibiotics stopped by ID team till seen by Dr. Lajoyce Corners  -Orthopedics to follow today  OSA (obstructive sleep apnea) Continue with home CPAP.   Obesity (BMI 30-39.9) BMI 34.7   Sacral pressure ulcer Presents on admission. See wound consult note.  Silicone foam for sacrum.   Anemia of chronic kidney failure, stage 5 (HCC) Baseline HgB 9-10 g/dl   Chronic diastolic heart failure (HCC) Does not make urine. Volume status controlled by HD.   ESRD (end stage renal disease) on dialysis [facility]) Nephrology consulted for routine HD. Dialyzes via left UE AVF   Acquired hypothyroidism Stable. On synthroid 75 mcg daily.   Primary hypertension Stable.   Mixed hyperlipidemia Stable. On lipitor 10 mg daily.   Type 2 diabetes mellitus with ESRD (end-stage renal disease) (HCC) Stable. On lantus and SSI. -Continue Lantus  10 units subcu daily     Medications     aspirin EC  81 mg Oral Daily   atorvastatin  10 mg Oral QPM   feeding supplement (NEPRO CARB STEADY)  237 mL Oral BID BM   ferric citrate  630 mg Oral TID WC   insulin aspart  0-6 Units Subcutaneous Q4H   insulin glargine-yfgn  10 Units Subcutaneous QHS   levothyroxine  75 mcg Oral Daily   midodrine  10 mg Oral Daily   multivitamin  1 tablet Oral QHS   nutrition supplement (JUVEN)  1 packet Oral BID BM   pantoprazole  40 mg Oral BID   sodium chloride flush  3 mL Intravenous Q12H   sodium chloride flush  3 mL Intravenous Q12H     Data Reviewed:   CBG:  Recent Labs  Lab 08/06/23 1559 08/06/23 2002 08/07/23 0001 08/07/23 0356 08/07/23 0744  GLUCAP 188* 155* 148* 172* 187*    SpO2: 96 % O2 Flow Rate (L/min): 2 L/min    Vitals:   08/06/23 1456 08/06/23 2003 08/07/23 0400 08/07/23 0837  BP: 114/77 95/62 110/66   Pulse: 73 71 68 74  Resp: 17 18 15    Temp: 97.9 F (36.6 C) 98 F (36.7 C) 98.3 F (36.8 C) 97.8 F (36.6 C)  TempSrc: Axillary Oral Oral Oral  SpO2: 98% 96% 97% 96%  Weight:      Height:          Data Reviewed:  Basic Metabolic Panel: Recent Labs  Lab 08/02/23 1438 08/03/23 0600 08/05/23 1130 08/07/23 0409  NA 134* 135 133* 132*  K 4.1 3.8  4.6 4.4  CL 99 100 99 96*  CO2 24 21* 21* 23  GLUCOSE 157* 223* 198* 200*  BUN 37* 43* 59* 61*  CREATININE 6.21* 6.92* 7.73* 7.88*  CALCIUM 7.8* 7.8* 8.3* 8.8*  MG  --  2.2  --   --   PHOS  --  6.7* 6.2*  --     CBC: Recent Labs  Lab 08/02/23 1438 08/03/23 0600 08/04/23 0558 08/05/23 1130  WBC 7.8 6.4 6.7 7.8  NEUTROABS 6.0  --   --   --   HGB 11.4* 9.9* 10.9* 10.8*  HCT 36.7 32.7* 35.7* 34.3*  MCV 95.6 93.2 93.5 95.0  PLT 86* 75* 95* 106*    LFT Recent Labs  Lab 08/02/23 1435 08/02/23 1438 08/03/23 0600 08/05/23 1130  AST 14* 13* 11*  --   ALT 15 15 13   --   ALKPHOS 134* 131* 117  --   BILITOT 1.0 0.9 0.6  --   PROT 7.1 7.0 6.1*  --    ALBUMIN 4.0 4.0 3.3* 3.5     Antibiotics: Anti-infectives (From admission, onward)    Start     Dose/Rate Route Frequency Ordered Stop   08/02/23 2315  cefTRIAXone (ROCEPHIN) 2 g in sodium chloride 0.9 % 100 mL IVPB       Placed in "And" Linked Group   2 g 200 mL/hr over 30 Minutes Intravenous Every 24 hours 08/02/23 2313 08/04/23 2359   08/02/23 2315  metroNIDAZOLE (FLAGYL) IVPB 500 mg       Placed in "And" Linked Group   500 mg 100 mL/hr over 60 Minutes Intravenous Every 12 hours 08/02/23 2313 08/04/23 2359        DVT prophylaxis: Not on heparin, recent GI bleed  Code Status: Full code  Family Communication: No family at bedside   CONSULTS nephrology   Subjective   Denies pain.   Objective    Physical Examination:  General-appears in no acute distress Heart-S1-S2, regular, no murmur auscultated Lungs-clear to auscultation bilaterally, no wheezing or crackles auscultated Abdomen-soft, nontender, no organomegaly Extremities-trace edema in the lower extremities, bilateral lower extremities  in  dressing Neuro-alert, oriented x3, no focal deficit noted   Status is: Inpatient:          Meredeth Ide   [facility] If 7PM-7AM, please contact night-coverage at [email or web address], Office  [phone]   08/07/2023, 9:51 AM  LOS: 4 days

## 2023-08-08 ENCOUNTER — Other Ambulatory Visit (HOSPITAL_COMMUNITY): Payer: Self-pay

## 2023-08-08 ENCOUNTER — Telehealth: Payer: Self-pay | Admitting: Orthopedic Surgery

## 2023-08-08 DIAGNOSIS — E11628 Type 2 diabetes mellitus with other skin complications: Secondary | ICD-10-CM | POA: Diagnosis not present

## 2023-08-08 DIAGNOSIS — I739 Peripheral vascular disease, unspecified: Secondary | ICD-10-CM | POA: Diagnosis not present

## 2023-08-08 DIAGNOSIS — D696 Thrombocytopenia, unspecified: Secondary | ICD-10-CM

## 2023-08-08 DIAGNOSIS — N185 Chronic kidney disease, stage 5: Secondary | ICD-10-CM | POA: Diagnosis not present

## 2023-08-08 DIAGNOSIS — L97511 Non-pressure chronic ulcer of other part of right foot limited to breakdown of skin: Secondary | ICD-10-CM | POA: Diagnosis not present

## 2023-08-08 DIAGNOSIS — E039 Hypothyroidism, unspecified: Secondary | ICD-10-CM | POA: Diagnosis not present

## 2023-08-08 LAB — CBC WITH DIFFERENTIAL/PLATELET
Abs Immature Granulocytes: 0.03 10*3/uL (ref 0.00–0.07)
Basophils Absolute: 0 10*3/uL (ref 0.0–0.1)
Basophils Relative: 1 %
Eosinophils Absolute: 0.3 10*3/uL (ref 0.0–0.5)
Eosinophils Relative: 4 %
HCT: 33.8 % — ABNORMAL LOW (ref 36.0–46.0)
Hemoglobin: 10.6 g/dL — ABNORMAL LOW (ref 12.0–15.0)
Immature Granulocytes: 0 %
Lymphocytes Relative: 11 %
Lymphs Abs: 0.9 10*3/uL (ref 0.7–4.0)
MCH: 29.1 pg (ref 26.0–34.0)
MCHC: 31.4 g/dL (ref 30.0–36.0)
MCV: 92.9 fL (ref 80.0–100.0)
Monocytes Absolute: 0.7 10*3/uL (ref 0.1–1.0)
Monocytes Relative: 8 %
Neutro Abs: 6 10*3/uL (ref 1.7–7.7)
Neutrophils Relative %: 76 %
Platelets: 123 10*3/uL — ABNORMAL LOW (ref 150–400)
RBC: 3.64 MIL/uL — ABNORMAL LOW (ref 3.87–5.11)
RDW: 15.2 % (ref 11.5–15.5)
WBC: 7.9 10*3/uL (ref 4.0–10.5)
nRBC: 0 % (ref 0.0–0.2)

## 2023-08-08 LAB — RENAL FUNCTION PANEL
Albumin: 3.4 g/dL — ABNORMAL LOW (ref 3.5–5.0)
Anion gap: 14 (ref 5–15)
BUN: 78 mg/dL — ABNORMAL HIGH (ref 6–20)
CO2: 18 mmol/L — ABNORMAL LOW (ref 22–32)
Calcium: 8.4 mg/dL — ABNORMAL LOW (ref 8.9–10.3)
Chloride: 95 mmol/L — ABNORMAL LOW (ref 98–111)
Creatinine, Ser: 9.04 mg/dL — ABNORMAL HIGH (ref 0.44–1.00)
GFR, Estimated: 5 mL/min — ABNORMAL LOW (ref >60)
Glucose, Bld: 188 mg/dL — ABNORMAL HIGH (ref 70–99)
Phosphorus: 7 mg/dL — ABNORMAL HIGH (ref 2.5–4.6)
Potassium: 4.6 mmol/L (ref 3.5–5.1)
Sodium: 127 mmol/L — ABNORMAL LOW (ref 135–145)

## 2023-08-08 LAB — GLUCOSE, CAPILLARY
Glucose-Capillary: 117 mg/dL — ABNORMAL HIGH (ref 70–99)
Glucose-Capillary: 126 mg/dL — ABNORMAL HIGH (ref 70–99)
Glucose-Capillary: 172 mg/dL — ABNORMAL HIGH (ref 70–99)
Glucose-Capillary: 191 mg/dL — ABNORMAL HIGH (ref 70–99)

## 2023-08-08 MED ORDER — AMOXICILLIN-POT CLAVULANATE 500-125 MG PO TABS
1.0000 | ORAL_TABLET | Freq: Two times a day (BID) | ORAL | 0 refills | Status: AC
Start: 2023-08-08 — End: 2023-08-18
  Filled 2023-08-08: qty 20, 10d supply, fill #0

## 2023-08-08 MED ORDER — DOXYCYCLINE HYCLATE 100 MG PO TABS
100.0000 mg | ORAL_TABLET | Freq: Two times a day (BID) | ORAL | 0 refills | Status: AC
  Filled 2023-08-08: qty 20, 10d supply, fill #0

## 2023-08-08 MED ORDER — AMOXICILLIN-POT CLAVULANATE 500-125 MG PO TABS
1.0000 | ORAL_TABLET | Freq: Every day | ORAL | Status: DC
  Filled 2023-08-08: qty 1

## 2023-08-08 MED ORDER — DOXYCYCLINE HYCLATE 100 MG PO TABS: 100.0000 mg | ORAL_TABLET | Freq: Two times a day (BID) | ORAL | Status: DC

## 2023-08-08 MED ORDER — AMOXICILLIN-POT CLAVULANATE 500-125 MG PO TABS
1.0000 | ORAL_TABLET | Freq: Two times a day (BID) | ORAL | Status: DC
  Administered 2023-08-08: 1 via ORAL
  Filled 2023-08-08: qty 1

## 2023-08-08 NOTE — Progress Notes (Signed)
[facility] Progress Note   Subjective:  Seen in KDU. Tolerating HD. No complaints this am. Says she may go home today.   Objective Vitals:   08/08/23 1000 08/08/23 1030 08/08/23 1100 08/08/23 1130  BP: (!) 93/57 105/62 115/68 107/80  Pulse: 70 67 71 66  Resp: 18 13 18 18   Temp:      TempSrc:      SpO2: 99% 98% 96% 98%  Weight:      Height:       Physical Exam General: Overweight, well appearing woman, NAD. Room air Heart: RRR; no murmur Lungs: CTA anteriorly Abdomen: soft Extremities: BLE bandaged to knee; no thigh edema Dialysis Access: LUE AVF + bruit  Additional Objective Labs: Basic Metabolic Panel: Recent Labs  Lab 08/03/23 0600 08/05/23 1130 08/07/23 0409 08/08/23 0908  NA 135 133* 132* 127*  K 3.8 4.6 4.4 4.6  CL 100 99 96* 95*  CO2 21* 21* 23 18*  GLUCOSE 223* 198* 200* 188*  BUN 43* 59* 61* 78*  CREATININE 6.92* 7.73* 7.88* 9.04*  CALCIUM 7.8* 8.3* 8.8* 8.4*  PHOS 6.7* 6.2*  --  7.0*   Liver Function Tests: Recent Labs  Lab 08/02/23 1435 08/02/23 1438 08/03/23 0600 08/05/23 1130 08/08/23 0908  AST 14* 13* 11*  --   --   ALT 15 15 13   --   --   ALKPHOS 134* 131* 117  --   --   BILITOT 1.0 0.9 0.6  --   --   PROT 7.1 7.0 6.1*  --   --   ALBUMIN 4.0 4.0 3.3* 3.5 3.4*   CBC: Recent Labs  Lab 08/02/23 1438 08/03/23 0600 08/04/23 0558 08/05/23 1130 08/08/23 0908  WBC 7.8 6.4 6.7 7.8 7.9  NEUTROABS 6.0  --   --   --  6.0  HGB 11.4* 9.9* 10.9* 10.8* 10.6*  HCT 36.7 32.7* 35.7* 34.3* 33.8*  MCV 95.6 93.2 93.5 95.0 92.9  PLT 86* 75* 95* 106* 123*   Studies/Results: No results found. Medications:  sodium chloride     sodium chloride      aspirin EC  81 mg Oral Daily   atorvastatin  10 mg Oral QPM   Chlorhexidine Gluconate Cloth  6 each Topical Q0600   feeding supplement (NEPRO CARB STEADY)  237 mL Oral BID BM   ferric citrate  630 mg Oral TID WC   insulin aspart  0-6 Units Subcutaneous Q4H   insulin glargine-yfgn   10 Units Subcutaneous QHS   levothyroxine  75 mcg Oral Daily   midodrine  10 mg Oral Daily   multivitamin  1 tablet Oral QHS   nutrition supplement (JUVEN)  1 packet Oral BID BM   pantoprazole  40 mg Oral BID   sodium chloride flush  3 mL Intravenous Q12H   sodium chloride flush  3 mL Intravenous Q12H    Dialysis Orders: [name]  3:45hr, 400/800, EDW 92.5kg, 2K/2.5Ca, AVF, no heparin - Calcitriol 0.75mcg PO q HD - Sensipar 90mg  PO q HD - Mircera IV q 2 weeks - Venofer 100mg  x 10 (in middle of course)   Assessment/Plan:  R foot wound/?osteomyelitis (per imaging only): Initially started on Ceftriaxone + Flagyl. Ortho consult pending. [facility]VS consulted, s/p LE angiography - occluded R ATA but otherwise open - no interventions. ID involved - ESR only 7 - they don't think this is OM - abx on hold for now. Ortho Lajoyce Corners) to assess her.  Hx calciphylaxis:  Reports no new wounds recently except for her foot which she reports is due to DM, no longer on sodium thiosulfate ESRD:  TTS schedule -> HD today. Ok for discharge from renal standpoint.  Hypotension/volume: BP soft, on midodrine daily here. Chronic LE edema. Now below prior EDW - lower on discharge.  Anemia: Hgb 10.8 - hold off on ESA for now.  Metabolic bone disease: Phos 6.2, continue binders. Calcium slightly low Hx calciphylaxis - do not supplement.  Nutrition: Alb 3.5 - improving. Continue supps. T2DM: On insulin, per admitting team  Tomasa Blase PA-C [facility] 08/08/2023,12:26 PM

## 2023-08-08 NOTE — Consult Note (Signed)
ORTHOPAEDIC CONSULTATION  REQUESTING PHYSICIAN: Meredeth Ide, MD  Chief Complaint: Acute draining ulcer right foot.  HPI: Amy Moses is a 52 y.o. female who presents with acute draining ulcer right foot.  Patient has undergone serial compression wraps for venous insufficiency to her lower extremities.  Past Medical History:  Diagnosis Date   Anemia    Blindness of right eye with low vision in contralateral eye    s/p victrectomy   Chronic diastolic heart failure (HCC) 03/30/2021   Diabetes mellitus, type II (HCC)    Dyslipidemia    Glaucoma    History of blood transfusion    Hypertension    Hypothyroidism (acquired)    Kidney disease    Stage 5   Mitral regurgitation 11/16/2022   Pneumonia    Pulmonary hypertension, unspecified (HCC) 09/12/2022   Past Surgical History:  Procedure Laterality Date   ABDOMINAL AORTOGRAM W/LOWER EXTREMITY Bilateral 12/18/2020   Procedure: ABDOMINAL AORTOGRAM W/LOWER EXTREMITY;  Surgeon: Sherren Kerns, MD;  Location: [facility];  Service: Cardiovascular;  Laterality: Bilateral;   ABDOMINAL AORTOGRAM W/LOWER EXTREMITY Bilateral 01/25/2022   Procedure: ABDOMINAL AORTOGRAM W/LOWER EXTREMITY;  Surgeon: Nada Libman, MD;  Location: [facility];  Service: Cardiovascular;  Laterality: Bilateral;   ABDOMINAL AORTOGRAM W/LOWER EXTREMITY Right 08/04/2023   Procedure: ABDOMINAL AORTOGRAM W/LOWER EXTREMITY;  Surgeon: Leonie Douglas, MD;  Location: [facility];  Service: Cardiovascular;  Laterality: Right;   AMPUTATION Right 02/16/2022   Procedure: RIGHT FOOT 5TH RAY AMPUTATION;  Surgeon: Nadara Mustard, MD;  Location: [facility] OR;  Service: Orthopedics;  Laterality: Right;   ANKLE FRACTURE SURGERY Right    AV FISTULA PLACEMENT Left 08/18/2020   Procedure: LEFT ARM BRACHIOCEPHALIC ARTERIOVENOUS (AV) FISTULA CREATION;  Surgeon: Sherren Kerns, MD;  Location: [facility] OR;  Service: Vascular;  Laterality: Left;   BIOPSY  04/24/2021    Procedure: BIOPSY;  Surgeon: Lanelle Bal, DO;  Location: [facility];  Service: Endoscopy;;   CESAREAN SECTION     CHOLECYSTECTOMY     COLONOSCOPY  04/24/2021   Surgeon: Lanelle Bal, DO;  nonbleeding internal hemorrhoids, 1 large (25 mm) pedunculated transverse colon polyp (prolapse type polyp) with adherent clot and stigmata of recent bleed.   COLONOSCOPY WITH PROPOFOL N/A 05/14/2021   Procedure: COLONOSCOPY WITH PROPOFOL;  Surgeon: Corbin Ade, MD;  Location: [facility];  Service: Endoscopy;  Laterality: N/A;   COLONOSCOPY WITH PROPOFOL N/A 03/17/2023   Procedure: COLONOSCOPY WITH PROPOFOL;  Surgeon: Corbin Ade, MD;  [facility]: [facility];  Service: Endoscopy;  Laterality: N/A;   ESOPHAGOGASTRODUODENOSCOPY (EGD) WITH PROPOFOL N/A 04/24/2021   Surgeon: Lanelle Bal, DO;  duodenal erosions and gastritis biopsied (pathology with peptic duodenitis, reactive gastropathy with erosions/chronic inflammation, negative for H. pylori)   EYE SURGERY     Vatrectomy   HEMOSTASIS CLIP PLACEMENT  05/14/2021   Procedure: HEMOSTASIS CLIP PLACEMENT;  Surgeon: Corbin Ade, MD;  [facility]: [facility];  Service: Endoscopy;;   IR PERC TUN PERIT CATH WO PORT S&I /IMAG  09/15/2020   IR REMOVAL TUN CV CATH W/O FL  02/19/2021   IR US GUIDE VASC ACCESS RIGHT  09/15/2020   POLYPECTOMY  04/24/2021   Procedure: POLYPECTOMY;  Surgeon: Lanelle Bal, DO;  [facility]: [facility];  Service: Endoscopy;;   POLYPECTOMY  05/14/2021   Procedure: POLYPECTOMY;  Surgeon: Corbin Ade, MD;  [facility]: [facility];  Service: Endoscopy;;  POLYPECTOMY  03/17/2023   Procedure: POLYPECTOMY;  Surgeon: Corbin Ade, MD;  Location: [facility];  Service: Endoscopy;;   SKIN SPLIT GRAFT Bilateral 09/03/2021   Procedure: SKIN GRAFT BILATERAL LEGS;  Surgeon: Nadara Mustard, MD;  Location[facility];  Service: Orthopedics;  Laterality: Bilateral;   SKIN SPLIT GRAFT Left 12/10/2021   Procedure:  IRRIGATION AND DEBRIDEMENT LEFT CALF, APPLICATION SPLIT THICKNESS SKIN GRAFT;  Surgeon: Nadara Mustard, MD;  Location: [facility];  Service: Orthopedics;  Laterality: Left;   TOE SURGERY     Social History   Socioeconomic History   Marital status: Single    Spouse name: Not on file   Number of children: Not on file   Years of education: Not on file   Highest education level: Not on file  Occupational History   Not on file  Tobacco Use   Smoking status: Never   Smokeless tobacco: Never  Vaping Use   Vaping status: Never Used  Substance and Sexual Activity   Alcohol use: No   Drug use: No   Sexual activity: Yes    Birth control/protection: Condom  Other Topics Concern   Not on file  Social History Narrative   Not on file   Social Determinants of Health   Financial Resource Strain: Not on file  Food Insecurity: Food Insecurity Present (08/03/2023)   Hunger Vital Sign    Worried About Running Out of Food in the Last Year: Sometimes true    Ran Out of Food in the Last Year: Sometimes true  Transportation Needs: Unmet Transportation Needs (08/03/2023)   PRAPARE - Administrator, Civil Service (Medical): Yes    Lack of Transportation (Non-Medical): Yes  Physical Activity: Not on file  Stress: Not on file  Social Connections: Not on file   Family History  Problem Relation Age of Onset   Heart failure Mother    Heart disease Mother    Diabetes Mother    Kidney disease Mother    Heart failure Father    Diabetes Father    Heart disease Father    Diabetes Brother    Heart failure Maternal Grandmother    Heart failure Maternal Grandfather    Transient ischemic attack Maternal Grandfather    Colon cancer Neg Hx    - negative except otherwise stated in the family history section Allergies  Allergen Reactions   Ace Inhibitors Cough   Prior to Admission medications   Medication Sig Start Date End Date Taking? Authorizing Provider  acetaminophen (TYLENOL) 500 MG  tablet Take 1,000 mg by mouth every 6 (six) hours as needed for moderate pain.   Yes [provider]  atorvastatin (LIPITOR) 10 MG tablet Take 10 mg by mouth every evening.   Yes [provider]  AURYXIA 1 GM 210 MG(Fe) tablet Take 630 mg by mouth 3 (three) times daily with meals. 01/07/22  Yes [provider]  HUMALOG KWIKPEN 100 UNIT/ML KwikPen Inject 5-11 Units into the skin 3 (three) times daily with meals. If eats 50% or more of meal. Patient taking differently: Inject 5-11 Units into the skin 3 (three) times daily with meals. If eats 50% or more of meal. Sliding scale 03/16/22  Yes Nida, Denman George, MD  insulin glargine (LANTUS) 100 UNIT/ML injection Inject 0.2 mLs (20 Units total) into the skin at bedtime. Patient taking differently: Inject 8 Units into the skin at bedtime. 04/06/22  Yes Roma Kayser, MD  levothyroxine (SYNTHROID) 75  MCG tablet Take 75 mcg by mouth daily. 06/13/22  Yes [provider]  midodrine (PROAMATINE) 10 MG tablet Take 10 mg by mouth daily. 09/27/22  Yes [provider]  pantoprazole (PROTONIX) 40 MG tablet Take 1 tablet (40 mg total) by mouth 2 (two) times daily. 04/25/21  Yes Emokpae, Courage, MD  polyethylene glycol powder (MIRALAX) 17 GM/SCOOP powder Take 17 g by mouth daily. 03/17/23  Yes Narda Bonds, MD  Vitamin D, Ergocalciferol, (DRISDOL) 1.25 MG (50000 UNIT) CAPS capsule Take 50,000 Units by mouth every 7 (seven) days. Friday 04/20/21  Yes [provider]  Blood Glucose Monitoring Suppl (ACCU-CHEK GUIDE ME) w/Device KIT 1 Piece by Does not apply route as directed. 03/16/22   Roma Kayser, MD  Blood Pressure Monitor KIT TAKE BLOOD PRESSURE DAILY 09/15/22   Chilton Si, MD  Continuous Blood Gluc Receiver (FREESTYLE LIBRE 2 READER) DEVI As directed 04/06/22   Roma Kayser, MD  Continuous Blood Gluc Sensor (FREESTYLE LIBRE 2 SENSOR) MISC 1 Piece by Does not apply route every 14  (fourteen) days. 04/06/22   Roma Kayser, MD  glucose blood (ACCU-CHEK GUIDE) test strip Use as instructed 03/16/22   Roma Kayser, MD   No results found. - pertinent xrays, CT, MRI studies were reviewed and independently interpreted  Positive ROS: All other systems have been reviewed and were otherwise negative with the exception of those mentioned in the HPI and as above.  Physical Exam: General: Alert, no acute distress Psychiatric: Patient is competent for consent with normal mood and affect Lymphatic: No axillary or cervical lymphadenopathy Cardiovascular: No pedal edema Respiratory: No cyanosis, no use of accessory musculature GI: No organomegaly, abdomen is soft and non-tender    Images:  @ENCIMAGES @  Labs:  Lab Results  Component Value Date   HGBA1C 9.0 (H) 08/02/2023   HGBA1C 9.4 (H) 10/01/2022   HGBA1C 11.5 (H) 02/15/2022   ESRSEDRATE 7 08/03/2023   ESRSEDRATE 34 (H) 02/14/2022   ESRSEDRATE 40 (H) 08/07/2021   CRP 1.7 (H) 08/03/2023   CRP 7.0 (H) 02/14/2022   CRP 11.1 (H) 08/07/2021   REPTSTATUS 08/07/2023 FINAL 08/02/2023   GRAMSTAIN  07/08/2021    RARE WBC PRESENT,BOTH PMN AND MONONUCLEAR RARE GRAM POSITIVE RODS RARE GRAM POSITIVE COCCI IN PAIRS    CULT  08/02/2023    NO GROWTH 5 DAYS Performed at [facility], [address] [place], [place] [address]    Cindie Crumbly KOSERI 04/28/2022    Lab Results  Component Value Date   ALBUMIN 3.4 (L) 08/08/2023   ALBUMIN 3.5 08/05/2023   ALBUMIN 3.3 (L) 08/03/2023   PREALBUMIN 22 08/03/2023   PREALBUMIN 23 08/02/2023   PREALBUMIN 13.7 (L) 08/07/2021        Latest Ref Rng & Units 08/08/2023    9:08 AM 08/05/2023   11:30 AM 08/04/2023    5:58 AM  CBC EXTENDED  WBC 4.0 - 10.5 K/uL 7.9  7.8  6.7   RBC 3.87 - 5.11 MIL/uL 3.64  3.61  3.82   Hemoglobin 12.0 - 15.0 g/dL 95.6  21.3  08.6   HCT 36.0 - 46.0 % 33.8  34.3  35.7   Platelets 150 - 400 K/uL 123  106  95   NEUT# 1.7 -  7.7 K/uL 6.0     Lymph# 0.7 - 4.0 K/uL 0.9       Neurologic: Patient does not have protective sensation bilateral lower extremities.   MUSCULOSKELETAL:  Skin: Examination of the ulcer on the plantar aspect of the right foot has been decompressed there is good superficial epithelialization at the base there is no further abscess or ulceration.  Patient has chronic brawny skin color changes from venous and arterial insufficiency no gangrenous changes.  Patient's white cell count is 7.9, albumin 3.4, hemoglobin A1c 9.0.  Patient is on hemodialysis.  Assessment: Assessment: Resolving ulcer plantar aspect the right foot.  Plan: Plan: Will discharge on oral antibiotics possibly doxycycline after dialysis.  Recommend she continue use the Prevalon boots and continue compression wraps to both lower extremities I will follow-up in the office in 1 week.  Thank you for the consult and the opportunity to see Ms. Klemens  Aldean Baker, MD [facility] [phone] 1:08 PM

## 2023-08-08 NOTE — Telephone Encounter (Signed)
Patient called. Would like to be worked in on 9/23 per [place]. Her cb# is [phone]

## 2023-08-08 NOTE — Progress Notes (Signed)
Id brief note Reviewed ortho's evaluation Discussed with primary team   Can discharge on doxy 100 mg po bid and augmentin renally dosed for 10 days  F/u ortho

## 2023-08-08 NOTE — Progress Notes (Signed)
HD Progress Note:   08/08/23 1250  Vitals  Temp 97.9 F (36.6 C)  Temp Source Oral  BP 114/72  MAP (mmHg) 87  BP Location Right Arm  BP Method Automatic  Patient Position (if appropriate) Lying  Pulse Rate 65  Pulse Rate Source Monitor  ECG Heart Rate 70  Resp 20  Oxygen Therapy  SpO2 97 %  O2 Device Room Air  Patient Activity (if Appropriate) In bed  Pulse Oximetry Type Continuous  Oximetry Probe Site Changed No  During Treatment Monitoring  HD Safety Checks Performed Yes  Intra-Hemodialysis Comments Tx completed  Dialysis Fluid Bolus Normal Saline  Bolus Amount (mL) 300 mL  Post Treatment  Dialyzer Clearance Lightly streaked  Hemodialysis Intake (mL) 0 mL  Liters Processed 76.5  Fluid Removed (mL) 2000 mL  Tolerated HD Treatment Yes  Post-Hemodialysis Comments Completed HD without acute issues/complications. BP Stable.  AVG/AVF Arterial Site Held (minutes) 10 minutes  AVG/AVF Venous Site Held (minutes) 10 minutes  Fistula / Graft Left Upper arm Arteriovenous fistula  No placement date or time found.   Placed prior to admission: Yes  Orientation: Left  Access Location: Upper arm  Access Type: Arteriovenous fistula  Site Condition No complications  Fistula / Graft Assessment Present;Thrill;Bruit  Status Deaccessed  Drainage Description None

## 2023-08-08 NOTE — Discharge Summary (Signed)
Physician Discharge Summary   Patient: Amy Moses MRN: [id number] DOB: June 22, 1971  Admit date:     08/02/2023  Discharge date: 08/08/23  Discharge Physician: Meredeth Ide   PCP: Lorelei Pont, DO   Recommendations at discharge:   Follow-up orthopedics Dr. Lajoyce Corners in 1 week Follow-up PCP in 2 weeks for adjustment of insulin, hemoglobin A1c 9.0  Discharge Diagnoses: Principal Problem:   Ischemic ulcer of right foot (HCC) Active Problems:   Acute osteomyelitis of right foot (HCC)   Type 2 diabetes mellitus with ESRD (end-stage renal disease) (HCC)   Mixed hyperlipidemia   Primary hypertension   Acquired hypothyroidism   ESRD (end stage renal disease) on dialysis (HCC)   Chronic diastolic heart failure (HCC)   Anemia of chronic kidney failure, stage 5 (HCC)   Sacral pressure ulcer   Obesity (BMI 30-39.9)   OSA (obstructive sleep apnea)   Non-pressure chronic ulcer of other part of right foot limited to breakdown of skin (HCC)   PAD (peripheral artery disease) (HCC)  Resolved Problems:   * No resolved hospital problems. *  Hospital Course:  HPI: 52 y.o. female with medical history significant of DM2, ESRD, chronic foot infection, calciphylaxis,  Presented with  right foot wound She has history of nonhealing foot ulcers in the past as well as calciphylaxis And end-stage diabetes last hemodialysis was yesterday she has home health nurse who has been doing compression dressings to her legs but this morning the wound care nurse seen her and was concerned because it was more draining and her foot appears to be red no fevers or chills patient is diabetic neuropathy. Orthopedics, ID and vascular surgery consulted  Assessment and Plan:  Ischemic ulcer of right foot (HCC) -Draining right foot ulcer -Orthopedics consulted, recommended to get ABIs and vascular surgery consultation -ABIs obtained underwent left common femoral access for right lower extremity angiogram; optimized for  surgery as per vascular - MRI right foot completed.MRI suggestive of cuboid edema next to the lateral ulcer.  -Patient was seen by orthopedics Dr. Lajoyce Corners today -Recommend discharge on antibiotics and follow-up with clinic in 1 week -Discussed with ID patient will be discharged on Augmentin 500/125 mg p.o. twice daily for 10 days along with doxycycline 100 mg p.o. twice daily   OSA (obstructive sleep apnea) Continue with home CPAP.   Obesity (BMI 30-39.9) BMI 34.7   Sacral pressure ulcer Presents on admission. See wound consult note.  Silicone foam for sacrum.   Anemia of chronic kidney failure, stage 5 (HCC) Baseline HgB 9-10 g/dl   Chronic diastolic heart failure (HCC) Does not make urine. Volume status controlled by HD.   ESRD (end stage renal disease) on dialysis (HCC) -Continue hemodialysis as outpatient   Acquired hypothyroidism Stable. On synthroid 75 mcg daily.   Primary hypertension Stable.   Mixed hyperlipidemia Stable. On lipitor 10 mg daily.   Type 2 diabetes mellitus with ESRD (end-stage renal disease) (HCC) Hemoglobin A1c 9.0 -Continue home regimen -Follow-up PCP for further adjustment of insulin         Consultants: Infectious disease, orthopedics Procedures performed:  Disposition: Home Diet recommendation:  Discharge Diet Orders (From admission, onward)     Start     Ordered   08/08/23 0000  Diet - low sodium heart healthy        08/08/23 1610           Carb modified diet DISCHARGE MEDICATION: Allergies as of 08/08/2023       Reactions  Ace Inhibitors Cough        Medication List     TAKE these medications    Accu-Chek Guide Me w/Device Kit 1 Piece by Does not apply route as directed.   Accu-Chek Guide test strip Generic drug: glucose blood Use as instructed   acetaminophen 500 MG tablet Commonly known as: TYLENOL Take 1,000 mg by mouth every 6 (six) hours as needed for moderate pain.   amoxicillin-clavulanate 500-125  MG tablet Commonly known as: AUGMENTIN Take 1 tablet by mouth 2 (two) times daily for 10 days.   atorvastatin 10 MG tablet Commonly known as: LIPITOR Take 10 mg by mouth every evening.   Auryxia 1 GM 210 MG(Fe) tablet Generic drug: ferric citrate Take 630 mg by mouth 3 (three) times daily with meals.   Blood Pressure Monitor Kit TAKE BLOOD PRESSURE DAILY   doxycycline 100 MG tablet Commonly known as: VIBRA-TABS Take 1 tablet (100 mg total) by mouth 2 (two) times daily for 10 days.   FreeStyle Libre 2 Reader [name] As directed   [name] 2 Sensor Misc 1 Piece by Does not apply route every 14 (fourteen) days.   HumaLOG KwikPen 100 UNIT/ML KwikPen Generic drug: insulin lispro Inject 5-11 Units into the skin 3 (three) times daily with meals. If eats 50% or more of meal. What changed: additional instructions   insulin glargine 100 UNIT/ML injection Commonly known as: LANTUS Inject 0.2 mLs (20 Units total) into the skin at bedtime. What changed: how much to take   levothyroxine 75 MCG tablet Commonly known as: SYNTHROID Take 75 mcg by mouth daily.   midodrine 10 MG tablet Commonly known as: PROAMATINE Take 10 mg by mouth daily.   pantoprazole 40 MG tablet Commonly known as: Protonix Take 1 tablet (40 mg total) by mouth 2 (two) times daily.   polyethylene glycol powder 17 GM/SCOOP powder Commonly known as: MiraLax Take 17 g by mouth daily.   Vitamin D (Ergocalciferol) 1.25 MG (50000 UNIT) Caps capsule Commonly known as: DRISDOL Take 50,000 Units by mouth every 7 (seven) days. Friday               Discharge Care Instructions  (From admission, onward)           Start     Ordered   08/08/23 0000  Discharge wound care:       Comments: Silicone foam dressings to the at risk areas on the buttocks if needed, change every 3 days.  Continue Prevalon boots, compression wraps in both extremities.   08/08/23 1610            Follow-up Information      Care, [facility] Follow up.   Why: Registered Nurse-office to call with visit times. Contact information: [address] [place] [address] [phone]         Nadara Mustard, MD Follow up in 1 week(s).   Specialty: Orthopedic Surgery Contact information: [address] [place] [address] [phone]                Discharge Exam: [name] Weights   08/03/23 1604 08/03/23 2101  Weight: 93.2 kg 91.7 kg   General-appears in no acute distress Heart-S1-S2, regular, no murmur auscultated Lungs-clear to auscultation bilaterally, no wheezing or crackles auscultated Abdomen-soft, nontender, no organomegaly Extremities-no edema in the lower extremities Neuro-alert, oriented x3, no focal deficit noted  Condition at discharge: good  The results of significant diagnostics from this hospitalization (including imaging, microbiology,  ancillary and laboratory) are listed below for reference.   Imaging Studies: PERIPHERAL VASCULAR CATHETERIZATION  Result Date: 08/07/2023 DATE OF SERVICE: 08/04/2023  PATIENT:  [name]  52 y.o. female  PRE-OPERATIVE DIAGNOSIS:  Atherosclerosis of native arteries of right lower extremity causing ulceration  POST-OPERATIVE DIAGNOSIS:  Same  PROCEDURE:  1) Ultrasound guided left common femoral artery access 2) Aortogram 3) Right lower extremity angiogram with second order cannulation. 4) Conscious sedation (9 minutes)  SURGEON:  Rande Brunt. Lenell Antu, MD  ASSISTANT: none  ANESTHESIA:   local and IV sedation  ESTIMATED BLOOD LOSS: minimal  LOCAL MEDICATIONS USED:  LIDOCAINE  COUNTS: confirmed correct.  PATIENT DISPOSITION:  PACU - hemodynamically stable.  Delay start of Pharmacological VTE agent (>24hrs) due to surgical blood loss or risk of bleeding: no  INDICATION FOR PROCEDURE: Everlie Voelz is a 52 y.o. female with right foot ulceration, ESRD, calciphylaxis, and non-invasive evidence of peripheral arterial disaese. After careful  discussion of risks, benefits, and alternatives the patient was offered angiogram. The patient understood and wished to proceed.  OPERATIVE FINDINGS: Renal arteries patent Infrarenal aorta patent Bilateral common iliac arteries patent Bilateral internal iliac arteries patent Bilateral external iliac arteries patent  Right lower extremity: Common femoral artery: patent Profunda femoris artery: patent Superficial femoral artery: patent Popliteal artery: patent Anterior tibial artery: occluded Tibioperoneal trunk: patent Peroneal artery: patent Posterior tibial artery: patent Pedal circulation: fills via PT / Peroneal  DESCRIPTION OF PROCEDURE: After identification of the patient in the pre-operative holding area, the patient was transferred to the operating room. The patient was positioned supine on the operating room table.  Anesthesia was induced. The groins was prepped and draped in standard fashion. A surgical pause was performed confirming correct patient, procedure, and operative location.  The left groin was anesthetized with subcutaneous injection of 1% lidocaine. Using ultrasound guidance, the left common femoral artery was accessed with micropuncture technique. Fluoroscopy was used to confirm cannulation over the femoral head. The 66F sheath was upsized to 60F.  A Benson wire was advanced into the distal aorta. Over the wire an omni flush catheter was advanced to the level of L2. Aortogram was performed - see above for details.  The right common iliac artery was selected with an omniflush catheter and benson guidewire. The wire was advanced into the common femoral artery. Over the wire the omni flush catheter was advanced into the external iliac artery. Selective angiography was performed - see above for details.  The sheath was left in place to be removed in the recovery area  Conscious sedation was administered with the use of IV fentanyl and midazolam under continuous physician and nurse monitoring.  Heart  rate, blood pressure, and oxygen saturation were continuously monitored.  Total sedation time was 9 minutes  Upon completion of the case instrument and sharps counts were confirmed correct. The patient was transferred to the PACU in good condition. I was present for all portions of the procedure.  PLAN: ASA 81 mg by mouth daily. High intensity statin therapy. Patient has "in line" flow to foot, and is optimized from a vascular standpoint. Recommend local care to the foot.  Rande Brunt. Lenell Antu, MD [facility] Phone Number: [phone] 08/04/2023 2:33 PM   MR HEEL RIGHT WO CONTRAST  Result Date: 08/04/2023 CLINICAL DATA:  Evaluate for osteomyelitis. EXAM: MR OF THE RIGHT HEEL WITHOUT CONTRAST TECHNIQUE: Multiplanar, multisequence MR imaging of the right ankle was performed. No intravenous contrast was administered.  COMPARISON:  Foot MRI 08/03/2023 FINDINGS: Prior ORIF of the distal fibula and distal tibia with orthopedic hardware resulting in susceptibility artifact partially obscuring the soft tissue and osseous structures. TENDONS Peroneal: Peroneal longus tendon intact. Peroneal brevis intact. Posteromedial: Posterior tibial tendon intact. Flexor hallucis longus tendon intact. Flexor digitorum longus tendon intact. Anterior: Tibialis anterior tendon intact. Extensor hallucis longus tendon intact Extensor digitorum longus tendon intact. Achilles:  Mild tendinosis of the Achilles tendon. Plantar Fascia: Intact. LIGAMENTS Lateral: Anterior talofibular ligament intact. Calcaneofibular ligament intact. Posterior talofibular ligament intact. Anterior and posterior tibiofibular ligaments intact. Medial: Deltoid ligament intact. Spring ligament intact. CARTILAGE Ankle Joint: No joint effusion. Normal ankle mortise. High-grade partial-thickness cartilage loss of the tibiotalar joint with areas of full-thickness cartilage loss. Subtalar Joints/Sinus Tarsi: Normal subtalar joints. No  subtalar joint effusion. Normal sinus tarsi. Bones: No aggressive osseous lesion. No fracture or dislocation. Mild joint space narrowing throughout the midfoot. Soft tissue wound along the plantar lateral aspect of the cuboid with subcortical bone marrow edema consistent with osteomyelitis. Soft Tissue: No fluid collection or hematoma. Generalized muscle atrophy. Tarsal tunnel is normal. IMPRESSION: 1. Soft tissue wound along the plantar lateral aspect of the cuboid with subcortical bone marrow edema consistent with osteomyelitis. 2. Moderate-severe osteoarthritis of the tibiotalar joint. Electronically Signed   By: Elige Ko M.D.   On: 08/04/2023 11:24   VAS [name] ABI WITH/WO TBI  Result Date: 08/03/2023  LOWER EXTREMITY DOPPLER STUDY Patient Name:  SAIYURI AMARILLAS  Date of Exam:   08/03/2023 Medical Rec #: [id number]        Accession #:    [id number] Date of Birth: [birth year]       Patient Gender: F Patient Age:   77 years Exam Location:  [facility] Procedure:      VAS [name] ABI WITH/WO TBI Referring Phys: ANASTASSIA DOUTOVA --------------------------------------------------------------------------------  Indications: Ulceration. High Risk Factors: Hypertension, hyperlipidemia, Diabetes.  Limitations: Today's exam was limited due to bandages , an open wound and              Bilateral skin induration, Left restricted arm. Comparison Study: No prior studies. Performing Technologist: Olen Cordial RVT  Examination Guidelines: A complete evaluation includes at minimum, Doppler waveform signals and systolic blood pressure reading at the level of bilateral brachial, anterior tibial, and posterior tibial arteries, when vessel segments are accessible. Bilateral testing is considered an integral part of a complete examination. Photoelectric Plethysmograph (PPG) waveforms and toe systolic pressure readings are included as required and additional duplex testing as needed. Limited examinations for reoccurring  indications may be performed as noted.  ABI Findings: +---------+------------------+-----+----------+--------+ Right    Rt Pressure (mmHg)IndexWaveform  Comment  +---------+------------------+-----+----------+--------+ Brachial 129                    triphasic          +---------+------------------+-----+----------+--------+ PTA      254               1.97 monophasic         +---------+------------------+-----+----------+--------+ DP       254               1.97 monophasic         +---------+------------------+-----+----------+--------+ Great Toe55                0.43                    +---------+------------------+-----+----------+--------+ +--------+------------------+-----+------------------+----------+ Left  Lt Pressure (mmHg)IndexWaveform          Comment    +--------+------------------+-----+------------------+----------+ Brachial                                         Restricted +--------+------------------+-----+------------------+----------+ PTA                            Unable to insonateInduration +--------+------------------+-----+------------------+----------+ DP      254               1.97                              +--------+------------------+-----+------------------+----------+ +-------+-----------+-----------+------------+------------+ ABI/TBIToday's ABIToday's TBIPrevious ABIPrevious TBI +-------+-----------+-----------+------------+------------+ Right  [place]         0.43                                +-------+-----------+-----------+------------+------------+ Left   [place]                                             +-------+-----------+-----------+------------+------------+   Summary: Right: Resting right ankle-brachial index indicates noncompressible right lower extremity arteries. The right toe-brachial index is abnormal. Left: Resting left ankle-brachial index indicates noncompressible left lower extremity arteries.  Unable to obtain TBI due to great toe girth. *See table(s) above for measurements and observations.  Electronically signed by Heath Lark on 08/03/2023 at 10:52:38 PM.    Final    MR FOOT RIGHT WO CONTRAST  Result Date: 08/03/2023 CLINICAL DATA:  Diabetic with nonhealing foot wound. Soft tissue infection suspected. Previous 5th ray amputation. EXAM: MRI OF THE RIGHT FOREFOOT WITHOUT CONTRAST TECHNIQUE: Multiplanar, multisequence MR imaging of the right forefoot was performed. No intravenous contrast was administered. COMPARISON:  Radiographs 08/02/2023 and 05/10/2023. Previous MRI of the right foot and lower leg 05/02/2022. FINDINGS: Bones/Joint/Cartilage Today's examination was performed with a small field-of-view and concentrates on the forefoot. The midfoot and hindfoot are incompletely visualized. Previous amputation of the 5th ray. No acute osseous findings or evidence of osteomyelitis within the forefoot. There are old healed fractures of the 2nd and 3rd metatarsal shafts. There are advanced degenerative changes at the 1st metatarsophalangeal joint with a moderate hallux valgus deformity. No significant joint effusions. The alignment appears normal at the Lisfranc joint. Short axis axial and sagittal images partially image the cuboid. As seen on the previous MRI, there is marrow T2 hyperintensity along the inferolateral aspect of the cuboid adjacent to an overlying skin defect, suspicious for osteomyelitis. This is incompletely visualized on this examination. Ligaments Intact Lisfranc ligament. The collateral ligaments of the metatarsophalangeal joints appear intact. Muscles and Tendons Generalized forefoot muscular atrophy. The forefoot tendons appear intact, without significant tenosynovitis. Soft tissues Heterogeneous fat saturation secondary to the postsurgical changes in the distal lower leg. There is diffuse dorsal subcutaneous edema without focal fluid collection. As described above, there is a  persistent soft tissue ulcer along the inferolateral aspect of the cuboid, incompletely visualized on this examination of the forefoot. IMPRESSION: 1. Persistent soft tissue ulcer along the inferolateral aspect of the cuboid with underlying marrow changes in the cuboid, suspicious for osteomyelitis.  This is incompletely visualized on this examination of the forefoot. Recommend follow-up imaging of the hindfoot and midfoot. 2. No evidence of osteomyelitis within the forefoot. 3. Advanced degenerative changes at the 1st metatarsophalangeal joint with a moderate hallux valgus deformity. 4. Diffuse dorsal subcutaneous edema without focal fluid collection. 5. Old healed fractures of the 2nd and 3rd metatarsal shafts. Previous amputation of the 5th ray. Electronically Signed   By: Carey Bullocks M.D.   On: 08/03/2023 14:51   DG Foot Complete Right  Result Date: 08/02/2023 CLINICAL DATA:  Right foot wound EXAM: RIGHT FOOT COMPLETE - 3 VIEW COMPARISON:  X-ray 05/10/2023. Images only. There is no report available. FINDINGS: Fixation hardware seen about the distal tibia and fibula at the edge of the imaging field. There is severe osteopenia. Global soft tissue swelling about the foot with some plantar soft tissue gas at the level of the midfoot on the lateral view. Severe osteopenia with multifocal areas of bony deformity consistent with old trauma there is also hallux valgus deformity of the first ray with hypertrophic degenerative changes. 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 No definite erosive changes identified at this time. Vascular calcifications are seen. Plantar calcaneal spur. If there is further concern of bone infection, follow up MRI or bone scan may be useful for further sensitivity. IMPRESSION: Extensive soft tissue swelling about the foot with some new plantar soft tissue gas. Please correlate with clinical findings. Previous surgical changes from amputation  of fifth ray, fixation hardware about the ankle. Severe osteopenia with degenerative changes and posttraumatic changes. Electronically Signed   By: Karen Kays M.D.   On: 08/02/2023 15:54    Microbiology: Results for orders placed or performed during the hospital encounter of 08/02/23  Blood Cultures x 2 sites     Status: None   Collection Time: 08/02/23  6:35 PM   Specimen: BLOOD  Result Value Ref Range Status   Specimen Description BLOOD SITE NOT SPECIFIED  Final   Special Requests   Final    BOTTLES DRAWN AEROBIC AND ANAEROBIC Blood Culture results may not be optimal due to an inadequate volume of blood received in culture bottles   Culture   Final    NO GROWTH 5 DAYS Performed at [facility], [address] [place], [place] [address]    Report Status 08/07/2023 FINAL  Final  Blood Cultures x 2 sites     Status: None   Collection Time: 08/02/23  6:40 PM   Specimen: BLOOD  Result Value Ref Range Status   Specimen Description BLOOD SITE NOT SPECIFIED  Final   Special Requests   Final    BOTTLES DRAWN AEROBIC AND ANAEROBIC Blood Culture adequate volume   Culture   Final    NO GROWTH 5 DAYS Performed at [facility], [address], [place], [place] [address]    Report Status 08/07/2023 FINAL  Final  MRSA Next Gen by PCR, Nasal     Status: None   Collection Time: 08/03/23  4:40 AM   Specimen: Nasal Mucosa; Nasal Swab  Result Value Ref Range Status   MRSA by PCR Next Gen NOT DETECTED NOT DETECTED Final    Comment: (NOTE) The GeneXpert MRSA Assay (FDA approved for NASAL specimens only), is one component of a comprehensive MRSA colonization surveillance program. It is not intended to diagnose MRSA infection nor to guide or monitor treatment for MRSA infections. Test performance is not FDA  approved in patients less than 63 years old. Performed at [name], [address], [place], [place] [address]     Labs: CBC: Recent Labs  Lab 08/02/23 1438  08/03/23 0600 08/04/23 0558 08/05/23 1130 08/08/23 0908  WBC 7.8 6.4 6.7 7.8 7.9  NEUTROABS 6.0  --   --   --  6.0  HGB 11.4* 9.9* 10.9* 10.8* 10.6*  HCT 36.7 32.7* 35.7* 34.3* 33.8*  MCV 95.6 93.2 93.5 95.0 92.9  PLT 86* 75* 95* 106* 123*   Basic Metabolic Panel: Recent Labs  Lab 08/02/23 1438 08/03/23 0600 08/05/23 1130 08/07/23 0409 08/08/23 0908  NA 134* 135 133* 132* 127*  K 4.1 3.8 4.6 4.4 4.6  CL 99 100 99 96* 95*  CO2 24 21* 21* 23 18*  GLUCOSE 157* 223* 198* 200* 188*  BUN 37* 43* 59* 61* 78*  CREATININE 6.21* 6.92* 7.73* 7.88* 9.04*  CALCIUM 7.8* 7.8* 8.3* 8.8* 8.4*  MG  --  2.2  --   --   --   PHOS  --  6.7* 6.2*  --  7.0*   Liver Function Tests: Recent Labs  Lab 08/02/23 1435 08/02/23 1438 08/03/23 0600 08/05/23 1130 08/08/23 0908  AST 14* 13* 11*  --   --   ALT 15 15 13   --   --   ALKPHOS 134* 131* 117  --   --   BILITOT 1.0 0.9 0.6  --   --   PROT 7.1 7.0 6.1*  --   --   ALBUMIN 4.0 4.0 3.3* 3.5 3.4*   CBG: Recent Labs  Lab 08/07/23 1650 08/07/23 2028 08/08/23 0041 08/08/23 0358 08/08/23 0732  GLUCAP 170* 275* 191* 126* 117*    Discharge time spent: greater than 30 minutes.  Signed: Meredeth Ide, MD [facility] 08/08/2023

## 2023-08-08 NOTE — Progress Notes (Signed)
Occupational Therapy Treatment Patient Details Name: Amy Moses MRN: [id number] DOB: April 27, 1971 Today's Date: 08/08/2023   History of present illness Pt is a 52 y/o F admitted on 08/02/23 after presenting with c/o R foot wound that was red & draining more. Pt is being treated for ischemic ulcer of R foot. MRI shows R foot cuboid osteo. PMH: DM2, ESRD, chronic foot infection, calciphylaxis, R foot 5th ray amputation, R eye blindness, DM, glaucoma   OT comments  Pt seen for OT session shortly prior to d/c, pt able to perform IADL task of packing up nearby items from bedside table. Pt provided with handout and reviewed BUE exercises, pt able to demo, although incr difficulty when attempting digit add/abd. Pt able to verbalize understanding/use of AE demo'd in prior session. Pt presenting with impairments listed below, will follow acutely. Continue to recommend HHOT vs OP OT at d/c given pt already receiving [facility] services.      If plan is discharge home, recommend the following:  A little help with walking and/or transfers;A little help with bathing/dressing/bathroom;Assistance with cooking/housework   Equipment Recommendations  Tub/shower bench    Recommendations for Other Services      Precautions / Restrictions Precautions Precautions: Fall Restrictions Weight Bearing Restrictions: No       Mobility Bed Mobility               General bed mobility comments: deferred    Transfers                   General transfer comment: deferred     Balance                                           ADL either performed or assessed with clinical judgement   ADL Overall ADL's : Modified independent                                       General ADL Comments: pt able to perform IADL task of packing up items in prep for d/c    Extremity/Trunk Assessment Upper Extremity Assessment Upper Extremity Assessment: RUE deficits/detail;LUE  deficits/detail RUE Deficits / Details: Resting significant preference for digits 2-5 flexion in hook grasp with L more significant than R. Pt denies pain and during PROM able to easily extend into 0 degrees extension. Pt using without difficulty but does use in differing ways than typical. Pt with thenar/intrinsic muscular wasting noted. LUE Deficits / Details: Resting significant preference for digits 2-5 flexion (esp 3rd digit) in hook grasp with L more significant than R. Pt denies pain and during PROM able to easily extend into 0 degrees extension. Pt using without difficulty but does use in differing ways than typical. Pt with thenar/intrinsic muscular wasting noted.   Lower Extremity Assessment Lower Extremity Assessment: Defer to PT evaluation        Vision   Additional Comments: R eye blind at baseline   Perception Perception Perception: Not tested   Praxis Praxis Praxis: Not tested    Cognition Arousal: Alert Behavior During Therapy: [facility] for tasks assessed/performed Overall Cognitive Status: Within Functional Limits for tasks assessed  Exercises Exercises: Other exercises Other Exercises Other Exercises: BUE composite flex/ext Other Exercises: BUE digit add/abd Other Exercises: BUE isolated digit ext Other Exercises: bil thumb circumduction Other Exercises: bil MCP flex    Shoulder Instructions       General Comments VSS    Pertinent Vitals/ Pain       Pain Assessment Pain Assessment: No/denies pain  Home Living                                          Prior Functioning/Environment              Frequency  Min 1X/week        Progress Toward Goals  OT Goals(current goals can now be found in the care plan section)  Progress towards OT goals: Progressing toward goals  Acute Rehab OT Goals Patient Stated Goal: to go home OT Goal Formulation: With patient Time For Goal  Achievement: 08/18/23 Potential to Achieve Goals: Good ADL Goals Pt Will Perform Lower Body Dressing: with modified independence;with adaptive equipment;sitting/lateral leans Pt/caregiver will Perform Home Exercise Program: Increased ROM;Increased strength;Both right and left upper extremity;With written HEP provided Additional ADL Goal #1: Pt will perform bed mobility with or without use of AE to bring her BLE back into bed.  Plan      Co-evaluation                 AM-PAC OT "6 Clicks" Daily Activity     Outcome Measure   Help from another person eating meals?: None Help from another person taking care of personal grooming?: None Help from another person toileting, which includes using toliet, bedpan, or urinal?: None Help from another person bathing (including washing, rinsing, drying)?: None Help from another person to put on and taking off regular upper body clothing?: None Help from another person to put on and taking off regular lower body clothing?: None 6 Click Score: 24    End of Session    OT Visit Diagnosis: Muscle weakness (generalized) (M62.81)   Activity Tolerance Patient tolerated treatment well   Patient Left in bed;with call bell/phone within reach   Nurse Communication Mobility status        Time: 4782-9562 OT Time Calculation (min): 22 min  Charges: OT General Charges $OT Visit: 1 Visit OT Treatments $Therapeutic Exercise: 8-22 mins  Carver Fila, OTD, OTR/L SecureChat Preferred Acute Rehab [phone]   [name] 08/08/2023, 4:22 PM

## 2023-08-08 NOTE — Progress Notes (Signed)
   08/08/23 0012  BiPAP/CPAP/SIPAP  $ Non-Invasive Home Ventilator  Subsequent  BiPAP/CPAP/SIPAP Pt Type Adult  BiPAP/CPAP/SIPAP Resmed  Mask Type Nasal pillows  Mask Size Medium  Patient Home Equipment Yes  Safety Check Completed by RT for Home Unit Yes, no issues noted  BiPAP/CPAP /SiPAP Vitals  Resp 15  MEWS Score/Color  MEWS Score 1  MEWS Score Color Amy Moses

## 2023-08-08 NOTE — Progress Notes (Signed)
OT Cancellation Note  Patient Details Name: Amy Moses MRN: [id number] DOB: September 17, 1971   Cancelled Treatment:    Reason Eval/Treat Not Completed: Patient at procedure or test/ unavailable (off unit at HD)  Carver Fila, OTD, OTR/L SecureChat Preferred Acute Rehab [phone]   Dalphine Handing 08/08/2023, 8:53 AM

## 2023-08-08 NOTE — Progress Notes (Signed)
Messaged provider regarding pt having diarrhea. Provider placing orders.

## 2023-08-09 ENCOUNTER — Telehealth: Payer: Self-pay

## 2023-08-09 ENCOUNTER — Telehealth: Payer: Self-pay | Admitting: Orthopedic Surgery

## 2023-08-09 NOTE — Progress Notes (Signed)
Late Note Entry- August 09, 2023  Pt was d/c yesterday afternoon. Contacted [name] this morning to advise clinic of pt's d/c date and that pt should resume care tomorrow. D/C summary and last renal note faxed to clinic for continuation of care.   Olivia Canter Renal Navigator [phone]

## 2023-08-09 NOTE — Telephone Encounter (Signed)
Duplicate message. 

## 2023-08-09 NOTE — Telephone Encounter (Signed)
SW pt, she is now scheduled 9/23 at 1:15 w/ Dr. Lajoyce Corners.

## 2023-08-09 NOTE — Telephone Encounter (Signed)
Patient called, would like to know if she could get worked in next week. Needs 72 hours for transportation. Her cb# is [phone]

## 2023-08-09 NOTE — Telephone Encounter (Signed)
Patient called and left a message on triage.  She states that she was discharged from the hospital yesterday. Her right leg is weeping.  She said that her home health agency has no orders (but didn't give a name on the voicemail). She needs to know what to do. Call back #[phone]

## 2023-08-10 NOTE — Telephone Encounter (Signed)
Patient had called again before closing and I SW her. She sent a picture to [name] and showed Dr. Lajoyce Corners. He says to make sure she keeps washed with dial soap, keep dry dressing on leg, OR wear compression sock due to her swelling. Elevation above heart level. She is on two different abx treatments. She has an apt on Monday 08/14/23. HH orders were sent to [facility] yesterday before leaving office.

## 2023-08-14 ENCOUNTER — Ambulatory Visit (INDEPENDENT_AMBULATORY_CARE_PROVIDER_SITE_OTHER): Payer: Medicare Other | Admitting: Orthopedic Surgery

## 2023-08-14 ENCOUNTER — Encounter: Payer: Self-pay | Admitting: Orthopedic Surgery

## 2023-08-14 DIAGNOSIS — I739 Peripheral vascular disease, unspecified: Secondary | ICD-10-CM

## 2023-08-14 DIAGNOSIS — L03115 Cellulitis of right lower limb: Secondary | ICD-10-CM

## 2023-08-14 DIAGNOSIS — L97911 Non-pressure chronic ulcer of unspecified part of right lower leg limited to breakdown of skin: Secondary | ICD-10-CM

## 2023-08-14 DIAGNOSIS — I890 Lymphedema, not elsewhere classified: Secondary | ICD-10-CM | POA: Diagnosis not present

## 2023-08-14 DIAGNOSIS — L97923 Non-pressure chronic ulcer of unspecified part of left lower leg with necrosis of muscle: Secondary | ICD-10-CM | POA: Diagnosis not present

## 2023-08-14 NOTE — Progress Notes (Signed)
Office Visit Note   Patient: Amy Moses           Date of Birth: 1970-12-29           MRN: [id number] Visit Date: 08/14/2023              Requested by: Lorelei Pont, DO [address] [place],  [place] [address] PCP: Lorelei Pont, DO  Chief Complaint  Patient presents with   Right Foot - Follow-up      HPI: Patient is a 52 year old woman who is seen in follow-up for venous lymphatic insufficiency both lower extremities with arterial insufficiency as well.  Patient underwent endovascular evaluation and had inline flow to the right lower extremity on September 13.  Patient also had ankle-brachial indices that showed noncompressible flow.  Patient is currently undergoing compression wraps for both lower extremities changed 2 times a week at home.  Assessment & Plan: Visit Diagnoses:  1. Lymphedema   2. Calciphylaxis of right lower extremity with nonhealing ulcer, limited to breakdown of skin (HCC)   3. Calciphylaxis of left lower extremity with nonhealing ulcer with necrosis of muscle (HCC)   4. PVD (peripheral vascular disease) (HCC)   5. Cellulitis of right lower limb     Plan: Patient will complete her course of doxycycline.  She states she has GI side effects from the Augmentin and she will stop this.  Follow-Up Instructions: Return in about 4 weeks (around 09/11/2023).   Ortho Exam  Patient is alert, oriented, no adenopathy, well-dressed, normal affect, normal respiratory effort. Examination patient has mixed venous and lymphatic insufficiency both lower extremities.  The ulceration on the plantar aspect of the right foot have healed there is no cellulitis odor or drainage.  There were no open ulcers on either leg.  Imaging: No results found. No images are attached to the encounter.  Labs: Lab Results  Component Value Date   HGBA1C 9.0 (H) 08/02/2023   HGBA1C 9.4 (H) 10/01/2022   HGBA1C 11.5 (H) 02/15/2022   ESRSEDRATE 7 08/03/2023   ESRSEDRATE 34 (H)  02/14/2022   ESRSEDRATE 40 (H) 08/07/2021   CRP 1.7 (H) 08/03/2023   CRP 7.0 (H) 02/14/2022   CRP 11.1 (H) 08/07/2021   REPTSTATUS 08/07/2023 FINAL 08/02/2023   GRAMSTAIN  07/08/2021    RARE WBC PRESENT,BOTH PMN AND MONONUCLEAR RARE GRAM POSITIVE RODS RARE GRAM POSITIVE COCCI IN PAIRS    CULT  08/02/2023    NO GROWTH 5 DAYS Performed at [facility], [address] [place], [place] [address]    Cindie Crumbly KOSERI 04/28/2022     Lab Results  Component Value Date   ALBUMIN 3.4 (L) 08/08/2023   ALBUMIN 3.5 08/05/2023   ALBUMIN 3.3 (L) 08/03/2023   PREALBUMIN 22 08/03/2023   PREALBUMIN 23 08/02/2023   PREALBUMIN 13.7 (L) 08/07/2021    Lab Results  Component Value Date   MG 2.2 08/03/2023   MG 1.9 03/24/2023   MG 2.4 10/01/2022   Lab Results  Component Value Date   VD25OH 36.9 12/30/2021    Lab Results  Component Value Date   PREALBUMIN 22 08/03/2023   PREALBUMIN 23 08/02/2023   PREALBUMIN 13.7 (L) 08/07/2021      Latest Ref Rng & Units 08/08/2023    9:08 AM 08/05/2023   11:30 AM 08/04/2023    5:58 AM  CBC EXTENDED  WBC 4.0 - 10.5 K/uL 7.9  7.8  6.7   RBC 3.87 - 5.11 MIL/uL 3.64  3.61  3.82   Hemoglobin 12.0 - 15.0 g/dL 16.1  09.6  04.5   HCT 36.0 - 46.0 % 33.8  34.3  35.7   Platelets 150 - 400 K/uL 123  106  95   NEUT# 1.7 - 7.7 K/uL 6.0     Lymph# 0.7 - 4.0 K/uL 0.9        There is no height or weight on file to calculate BMI.  Orders:  No orders of the defined types were placed in this encounter.  No orders of the defined types were placed in this encounter.    Procedures: No procedures performed  Clinical Data: No additional findings.  ROS:  All other systems negative, except as noted in the HPI. Review of Systems  Objective: Vital Signs: LMP 08/22/2015 (Approximate)   Specialty Comments:  No specialty comments available.  PMFS History: Patient Active Problem List   Diagnosis Date Noted   Non-pressure chronic ulcer  of other part of right foot limited to breakdown of skin (HCC) 08/08/2023   PAD (peripheral artery disease) (HCC) 08/08/2023   Acute osteomyelitis of right foot (HCC) 08/04/2023   OSA (obstructive sleep apnea) 08/02/2023   Prolapsed internal hemorrhoids, grade 3 03/29/2023   Thrombocytopenia (HCC) 03/21/2023   Mitral regurgitation 11/16/2022   Pressure injury of skin 10/01/2022   Pulmonary hypertension, unspecified (HCC) 09/12/2022   GERD (gastroesophageal reflux disease) 04/30/2022   Non-adherence to medical treatment 03/16/2022   Cutaneous abscess of right foot    Osteomyelitis of foot (HCC) 02/14/2022   Obesity (BMI 30-39.9) 01/26/2022   Ischemic ulcer of right foot (HCC) 01/24/2022   Anemia of chronic kidney failure, stage 5 (HCC) 01/24/2022   Sacral pressure ulcer 01/24/2022   Leg wound, left, sequela 12/10/2021   Open leg wound 09/03/2021   Wound infection    Non-pressure chronic ulcer of right calf limited to breakdown of skin (HCC)    Calciphylaxis of right lower extremity with nonhealing ulcer, limited to breakdown of skin (HCC)    Chronic ulcer of left thigh (HCC) 08/07/2021   Calciphylaxis of left lower extremity with nonhealing ulcer with necrosis of muscle (HCC) 08/07/2021   Chronic diastolic heart failure (HCC) 03/30/2021   ESRD (end stage renal disease) on dialysis (HCC) 11/16/2020   Calciphylaxis 11/06/2020   Non-healing open wound of heel 11/03/2020   Diabetic foot infection (HCC) 11/01/2020   Decubitus ulcer, heel 11/01/2020   Closed nondisplaced fracture of left patella 10/29/2020   Acute on chronic renal failure (HCC) 06/10/2020   Anemia of chronic disease 06/10/2020   Vitamin D deficiency 01/28/2019   Type 2 diabetes mellitus with ESRD (end-stage renal disease) (HCC) 09/21/2015   Mixed hyperlipidemia 09/21/2015   Primary hypertension 09/21/2015   Acquired hypothyroidism 09/21/2015   Iris bomb 07/31/2012   Secondary angle-closure glaucoma 07/31/2012    Past Medical History:  Diagnosis Date   Anemia    Blindness of right eye with low vision in contralateral eye    s/p victrectomy   Chronic diastolic heart failure (HCC) 03/30/2021   Diabetes mellitus, type II (HCC)    Dyslipidemia    Glaucoma    History of blood transfusion    Hypertension    Hypothyroidism (acquired)    Kidney disease    Stage 5   Mitral regurgitation 11/16/2022   Pneumonia    Pulmonary hypertension, unspecified (HCC) 09/12/2022    Family History  Problem Relation Age of Onset   Heart failure Mother    Heart disease Mother  Diabetes Mother    Kidney disease Mother    Heart failure Father    Diabetes Father    Heart disease Father    Diabetes Brother    Heart failure Maternal Grandmother    Heart failure Maternal Grandfather    Transient ischemic attack Maternal Grandfather    Colon cancer Neg Hx     Past Surgical History:  Procedure Laterality Date   ABDOMINAL AORTOGRAM W/LOWER EXTREMITY Bilateral 12/18/2020   Procedure: ABDOMINAL AORTOGRAM W/LOWER EXTREMITY;  Surgeon: Sherren Kerns, MD;  Location: [facility];  Service: Cardiovascular;  Laterality: Bilateral;   ABDOMINAL AORTOGRAM W/LOWER EXTREMITY Bilateral 01/25/2022   Procedure: ABDOMINAL AORTOGRAM W/LOWER EXTREMITY;  Surgeon: Nada Libman, MD;  Location: [facility];  Service: Cardiovascular;  Laterality: Bilateral;   ABDOMINAL AORTOGRAM W/LOWER EXTREMITY Right 08/04/2023   Procedure: ABDOMINAL AORTOGRAM W/LOWER EXTREMITY;  Surgeon: Leonie Douglas, MD;  Location: [facility];  Service: Cardiovascular;  Laterality: Right;   AMPUTATION Right 02/16/2022   Procedure: RIGHT FOOT 5TH RAY AMPUTATION;  Surgeon: Nadara Mustard, MD;  Location: [facility] OR;  Service: Orthopedics;  Laterality: Right;   ANKLE FRACTURE SURGERY Right    AV FISTULA PLACEMENT Left 08/18/2020   Procedure: LEFT ARM BRACHIOCEPHALIC ARTERIOVENOUS (AV) FISTULA CREATION;  Surgeon: Sherren Kerns, MD;   Location: [facility] OR;  Service: Vascular;  Laterality: Left;   BIOPSY  04/24/2021   Procedure: BIOPSY;  Surgeon: Lanelle Bal, DO;  Location: [facility];  Service: Endoscopy;;   CESAREAN SECTION     CHOLECYSTECTOMY     COLONOSCOPY  04/24/2021   Surgeon: Lanelle Bal, DO;  nonbleeding internal hemorrhoids, 1 large (25 mm) pedunculated transverse colon polyp (prolapse type polyp) with adherent clot and stigmata of recent bleed.   COLONOSCOPY WITH PROPOFOL N/A 05/14/2021   Procedure: COLONOSCOPY WITH PROPOFOL;  Surgeon: Corbin Ade, MD;  Location: [facility];  Service: Endoscopy;  Laterality: N/A;   COLONOSCOPY WITH PROPOFOL N/A 03/17/2023   Procedure: COLONOSCOPY WITH PROPOFOL;  Surgeon: Corbin Ade, MD;  [facility]: [facility];  Service: Endoscopy;  Laterality: N/A;   ESOPHAGOGASTRODUODENOSCOPY (EGD) WITH PROPOFOL N/A 04/24/2021   Surgeon: Lanelle Bal, DO;  duodenal erosions and gastritis biopsied (pathology with peptic duodenitis, reactive gastropathy with erosions/chronic inflammation, negative for H. pylori)   EYE SURGERY     Vatrectomy   HEMOSTASIS CLIP PLACEMENT  05/14/2021   Procedure: HEMOSTASIS CLIP PLACEMENT;  Surgeon: Corbin Ade, MD;  Location: [facility];  Service: Endoscopy;;   IR PERC TUN PERIT CATH WO PORT S&I /IMAG  09/15/2020   IR REMOVAL TUN CV CATH W/O FL  02/19/2021   IR US GUIDE VASC ACCESS RIGHT  09/15/2020   POLYPECTOMY  04/24/2021   Procedure: POLYPECTOMY;  Surgeon: Lanelle Bal, DO;  [facility]: [facility];  Service: Endoscopy;;   POLYPECTOMY  05/14/2021   Procedure: POLYPECTOMY;  Surgeon: Corbin Ade, MD;  [facility]: [facility];  Service: Endoscopy;;   POLYPECTOMY  03/17/2023   Procedure: POLYPECTOMY;  Surgeon: Corbin Ade, MD;  Location: [facility];  Service: Endoscopy;;   SKIN SPLIT GRAFT Bilateral 09/03/2021   Procedure: SKIN GRAFT BILATERAL LEGS;  Surgeon: Nadara Mustard, MD;  Location: [name] OR;  Service:  Orthopedics;  Laterality: Bilateral;   SKIN SPLIT GRAFT Left 12/10/2021   Procedure: IRRIGATION AND DEBRIDEMENT LEFT CALF, APPLICATION SPLIT THICKNESS SKIN GRAFT;  Surgeon: Nadara Mustard, MD;  Location:  [facility] OR;  Service: Orthopedics;  Laterality: Left;   TOE SURGERY     Social History   Occupational History   Not on file  Tobacco Use   Smoking status: Never   Smokeless tobacco: Never  Vaping Use   Vaping status: Never Used  Substance and Sexual Activity   Alcohol use: No   Drug use: No   Sexual activity: Yes    Birth control/protection: Condom

## 2023-08-21 ENCOUNTER — Ambulatory Visit: Payer: Medicare Other | Admitting: Orthopedic Surgery

## 2023-09-11 ENCOUNTER — Ambulatory Visit: Payer: Medicare Other | Admitting: Pulmonary Disease

## 2023-09-11 ENCOUNTER — Encounter: Payer: Self-pay | Admitting: Pulmonary Disease

## 2023-09-12 ENCOUNTER — Ambulatory Visit: Payer: Medicare Other | Admitting: Orthopedic Surgery

## 2023-09-15 ENCOUNTER — Telehealth: Payer: Self-pay

## 2023-09-15 NOTE — Telephone Encounter (Signed)
Lawson Fiscal, nurse with [facility] called and wanted to let Dr. Lajoyce Corners that patient's wound has reopened.  Stated that patient has an appt.on Monday, 09/18/2023.

## 2023-09-15 NOTE — Telephone Encounter (Signed)
Noted  

## 2023-09-18 ENCOUNTER — Encounter: Payer: Self-pay | Admitting: Orthopedic Surgery

## 2023-09-18 ENCOUNTER — Ambulatory Visit (INDEPENDENT_AMBULATORY_CARE_PROVIDER_SITE_OTHER): Payer: Medicare Other | Admitting: Orthopedic Surgery

## 2023-09-18 DIAGNOSIS — B351 Tinea unguium: Secondary | ICD-10-CM

## 2023-09-18 DIAGNOSIS — M20021 Boutonniere deformity of right finger(s): Secondary | ICD-10-CM | POA: Diagnosis not present

## 2023-09-18 DIAGNOSIS — I890 Lymphedema, not elsewhere classified: Secondary | ICD-10-CM | POA: Diagnosis not present

## 2023-09-18 DIAGNOSIS — L97411 Non-pressure chronic ulcer of right heel and midfoot limited to breakdown of skin: Secondary | ICD-10-CM

## 2023-09-18 NOTE — Progress Notes (Signed)
Office Visit Note   Patient: Amy Moses           Date of Birth: 12-13-1970           MRN: [id number] Visit Date: 09/18/2023              Requested by: Lorelei Pont, DO [address] [place],  [place] [address] PCP: Lorelei Pont, DO  Chief Complaint  Patient presents with   Right Leg - Follow-up   Left Leg - Follow-up      HPI: Patient is a 52 year old woman who is seen for evaluation.  She has an ongoing problem with venous and lymphatic ulceration both legs currently undergoing compression wraps at home twice a week.  Patient has an ongoing problem with onychomycotic nails x 10.  She has a new Wagner grade 1 ulcer on the plantar aspect of the right foot and has a new flexion deformity of the PIP joint of the right long finger without history of trauma.  She does have a history of lymphedema as well as calciphylaxis.  Assessment & Plan: Visit Diagnoses:  1. 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 Lymphedema   3. Ulcer of midfoot, right, limited to breakdown of skin (HCC)   4. Onychomycosis     Plan: Nails were trimmed x 10 and the ulcer on the plantar aspect the right foot was debrided back to healthy viable tissue.  Will have patient follow-up with Dr. Allyne Gee for the boutonniere deformity of the right long finger.  She will continue with compression wraps and both legs were wrapped today.  Follow-Up Instructions: Return in about 4 weeks (around 10/16/2023).   Ortho Exam  Patient is alert, oriented, no adenopathy, well-dressed, normal affect, normal respiratory effort. Examination patient has decreased edema in both legs there are no open ulcers no cellulitis no drainage in either leg.  There is brawny skin color changes with significant lymphedema swelling proximal to the knee.  She does have thickened discolored onychomycotic nails x 10 that she is unable to safely trim on her own and the nails were trimmed x 10 without complication.  Patient has a Wagner  grade 1 ulcer on the plantar midfoot on the right.  After informed consent a 10 blade knife was used to debride the skin and soft tissue back to healthy viable granulation tissue.  The ulcer measures 1 x 2 cm without tunneling with healthy granulation tissue no signs of infection.  Examination the right long finger patient has a boutonniere deformity.  She states that she is triggering but there is no palpable triggering.  She has no active extension at the PIP joint with clinical findings consistent with a rupture of the central slip.  Imaging: No results found. No images are attached to the encounter.  Labs: Lab Results  Component Value Date   HGBA1C 9.0 (H) 08/02/2023   HGBA1C 9.4 (H) 10/01/2022   HGBA1C 11.5 (H) 02/15/2022   ESRSEDRATE 7 08/03/2023   ESRSEDRATE 34 (H) 02/14/2022   ESRSEDRATE 40 (H) 08/07/2021   CRP 1.7 (H) 08/03/2023   CRP 7.0 (H) 02/14/2022   CRP 11.1 (H) 08/07/2021   REPTSTATUS 08/07/2023 FINAL 08/02/2023   GRAMSTAIN  07/08/2021    RARE WBC PRESENT,BOTH PMN AND MONONUCLEAR RARE GRAM POSITIVE RODS RARE GRAM POSITIVE COCCI IN PAIRS    CULT  08/02/2023    NO GROWTH 5 DAYS Performed at [name], [address] [place], [place] [address]  LABORGA CITROBACTER KOSERI 04/28/2022     Lab Results  Component Value Date   ALBUMIN 3.4 (L) 08/08/2023   ALBUMIN 3.5 08/05/2023   ALBUMIN 3.3 (L) 08/03/2023   PREALBUMIN 22 08/03/2023   PREALBUMIN 23 08/02/2023   PREALBUMIN 13.7 (L) 08/07/2021    Lab Results  Component Value Date   MG 2.2 08/03/2023   MG 1.9 03/24/2023   MG 2.4 10/01/2022   Lab Results  Component Value Date   VD25OH 36.9 12/30/2021    Lab Results  Component Value Date   PREALBUMIN 22 08/03/2023   PREALBUMIN 23 08/02/2023   PREALBUMIN 13.7 (L) 08/07/2021      Latest Ref Rng & Units 08/08/2023    9:08 AM 08/05/2023   11:30 AM 08/04/2023    5:58 AM  CBC EXTENDED  WBC 4.0 - 10.5 K/uL 7.9  7.8  6.7   RBC 3.87 - 5.11 MIL/uL  3.64  3.61  3.82   Hemoglobin 12.0 - 15.0 g/dL 78.2  95.6  21.3   HCT 36.0 - 46.0 % 33.8  34.3  35.7   Platelets 150 - 400 K/uL 123  106  95   NEUT# 1.7 - 7.7 K/uL 6.0     Lymph# 0.7 - 4.0 K/uL 0.9        There is no height or weight on file to calculate BMI.  Orders:  No orders of the defined types were placed in this encounter.  No orders of the defined types were placed in this encounter.    Procedures: No procedures performed  Clinical Data: No additional findings.  ROS:  All other systems negative, except as noted in the HPI. Review of Systems  Objective: Vital Signs: LMP 08/22/2015 (Approximate)   Specialty Comments:  No specialty comments available.  PMFS History: Patient Active Problem List   Diagnosis Date Noted   Non-pressure chronic ulcer of other part of right foot limited to breakdown of skin (HCC) 08/08/2023   PAD (peripheral artery disease) (HCC) 08/08/2023   Acute osteomyelitis of right foot (HCC) 08/04/2023   OSA (obstructive sleep apnea) 08/02/2023   Prolapsed internal hemorrhoids, grade 3 03/29/2023   Thrombocytopenia (HCC) 03/21/2023   Mitral regurgitation 11/16/2022   Pressure injury of skin 10/01/2022   Pulmonary hypertension, unspecified (HCC) 09/12/2022   GERD (gastroesophageal reflux disease) 04/30/2022   Non-adherence to medical treatment 03/16/2022   Cutaneous abscess of right foot    Osteomyelitis of foot (HCC) 02/14/2022   Obesity (BMI 30-39.9) 01/26/2022   Ischemic ulcer of right foot (HCC) 01/24/2022   Anemia of chronic kidney failure, stage 5 (HCC) 01/24/2022   Sacral pressure ulcer 01/24/2022   Leg wound, left, sequela 12/10/2021   Open leg wound 09/03/2021   Wound infection    Non-pressure chronic ulcer of right calf limited to breakdown of skin (HCC)    Calciphylaxis of right lower extremity with nonhealing ulcer, limited to breakdown of skin (HCC)    Chronic ulcer of left thigh (HCC) 08/07/2021   Calciphylaxis of left  lower extremity with nonhealing ulcer with necrosis of muscle (HCC) 08/07/2021   Chronic diastolic heart failure (HCC) 03/30/2021   ESRD (end stage renal disease) on dialysis (HCC) 11/16/2020   Calciphylaxis 11/06/2020   Non-healing open wound of heel 11/03/2020   Diabetic foot infection (HCC) 11/01/2020   Decubitus ulcer, heel 11/01/2020   Closed nondisplaced fracture of left patella 10/29/2020   Acute on chronic renal failure (HCC) 06/10/2020   Anemia of chronic disease 06/10/2020  Vitamin D deficiency 01/28/2019   Type 2 diabetes mellitus with ESRD (end-stage renal disease) (HCC) 09/21/2015   Mixed hyperlipidemia 09/21/2015   Primary hypertension 09/21/2015   Acquired hypothyroidism 09/21/2015   Iris bomb 07/31/2012   Secondary angle-closure glaucoma 07/31/2012   Past Medical History:  Diagnosis Date   Anemia    Blindness of right eye with low vision in contralateral eye    s/p victrectomy   Chronic diastolic heart failure (HCC) 03/30/2021   Diabetes mellitus, type II (HCC)    Dyslipidemia    Glaucoma    History of blood transfusion    Hypertension    Hypothyroidism (acquired)    Kidney disease    Stage 5   Mitral regurgitation 11/16/2022   Pneumonia    Pulmonary hypertension, unspecified (HCC) 09/12/2022    Family History  Problem Relation Age of Onset   Heart failure Mother    Heart disease Mother    Diabetes Mother    Kidney disease Mother    Heart failure Father    Diabetes Father    Heart disease Father    Diabetes Brother    Heart failure Maternal Grandmother    Heart failure Maternal Grandfather    Transient ischemic attack Maternal Grandfather    Colon cancer Neg Hx     Past Surgical History:  Procedure Laterality Date   ABDOMINAL AORTOGRAM W/LOWER EXTREMITY Bilateral 12/18/2020   Procedure: ABDOMINAL AORTOGRAM W/LOWER EXTREMITY;  Surgeon: Sherren Kerns, MD;  Location: [facility];  Service: Cardiovascular;  Laterality: Bilateral;    ABDOMINAL AORTOGRAM W/LOWER EXTREMITY Bilateral 01/25/2022   Procedure: ABDOMINAL AORTOGRAM W/LOWER EXTREMITY;  Surgeon: Nada Libman, MD;  Location: [facility];  Service: Cardiovascular;  Laterality: Bilateral;   ABDOMINAL AORTOGRAM W/LOWER EXTREMITY Right 08/04/2023   Procedure: ABDOMINAL AORTOGRAM W/LOWER EXTREMITY;  Surgeon: Leonie Douglas, MD;  Location: [facility];  Service: Cardiovascular;  Laterality: Right;   AMPUTATION Right 02/16/2022   Procedure: RIGHT FOOT 5TH RAY AMPUTATION;  Surgeon: Nadara Mustard, MD;  Location: [facility] OR;  Service: Orthopedics;  Laterality: Right;   ANKLE FRACTURE SURGERY Right    AV FISTULA PLACEMENT Left 08/18/2020   Procedure: LEFT ARM BRACHIOCEPHALIC ARTERIOVENOUS (AV) FISTULA CREATION;  Surgeon: Sherren Kerns, MD;  Location: [facility] OR;  Service: Vascular;  Laterality: Left;   BIOPSY  04/24/2021   Procedure: BIOPSY;  Surgeon: Lanelle Bal, DO;  Location: [facility];  Service: Endoscopy;;   CESAREAN SECTION     CHOLECYSTECTOMY     COLONOSCOPY  04/24/2021   Surgeon: Lanelle Bal, DO;  nonbleeding internal hemorrhoids, 1 large (25 mm) pedunculated transverse colon polyp (prolapse type polyp) with adherent clot and stigmata of recent bleed.   COLONOSCOPY WITH PROPOFOL N/A 05/14/2021   Procedure: COLONOSCOPY WITH PROPOFOL;  Surgeon: Corbin Ade, MD;  Location: [facility];  Service: Endoscopy;  Laterality: N/A;   COLONOSCOPY WITH PROPOFOL N/A 03/17/2023   Procedure: COLONOSCOPY WITH PROPOFOL;  Surgeon: Corbin Ade, MD;  Location: [facility];  Service: Endoscopy;  Laterality: N/A;   ESOPHAGOGASTRODUODENOSCOPY (EGD) WITH PROPOFOL N/A 04/24/2021   Surgeon: Lanelle Bal, DO;  duodenal erosions and gastritis biopsied (pathology with peptic duodenitis, reactive gastropathy with erosions/chronic inflammation, negative for H. pylori)   EYE SURGERY     Vatrectomy   HEMOSTASIS CLIP PLACEMENT  05/14/2021   Procedure:  HEMOSTASIS CLIP PLACEMENT;  Surgeon: Corbin Ade, MD;  Location: [facility];  Service:  Endoscopy;;   IR PERC TUN PERIT CATH WO PORT S&I /IMAG  09/15/2020   IR REMOVAL TUN CV CATH W/O FL  02/19/2021   IR US GUIDE VASC ACCESS RIGHT  09/15/2020   POLYPECTOMY  04/24/2021   Procedure: POLYPECTOMY;  Surgeon: Lanelle Bal, DO;  Location: [facility];  Service: Endoscopy;;   POLYPECTOMY  05/14/2021   Procedure: POLYPECTOMY;  Surgeon: Corbin Ade, MD;  [facility]: [facility];  Service: Endoscopy;;   POLYPECTOMY  03/17/2023   Procedure: POLYPECTOMY;  Surgeon: Corbin Ade, MD;  Location: [facility];  Service: Endoscopy;;   SKIN SPLIT GRAFT Bilateral 09/03/2021   Procedure: SKIN GRAFT BILATERAL LEGS;  Surgeon: Nadara Mustard, MD;  Location: [facility];  Service: Orthopedics;  Laterality: Bilateral;   SKIN SPLIT GRAFT Left 12/10/2021   Procedure: IRRIGATION AND DEBRIDEMENT LEFT CALF, APPLICATION SPLIT THICKNESS SKIN GRAFT;  Surgeon: Nadara Mustard, MD;  Location: [facility] OR;  Service: Orthopedics;  Laterality: Left;   TOE SURGERY     Social History   Occupational History   Not on file  Tobacco Use   Smoking status: Never   Smokeless tobacco: Never  Vaping Use   Vaping status: Never Used  Substance and Sexual Activity   Alcohol use: No   Drug use: No   Sexual activity: Yes    Birth control/protection: Condom

## 2023-09-20 IMAGING — MR MR [PERSON_NAME] LOW W/O CM*R*
9 series · 38 of 40 positions shown · non-contrast
Comparison: None Available.

CLINICAL DATA: Soft tissue infection of the lower leg and foot.

EXAM:
MRI OF LOWER RIGHT EXTREMITY WITHOUT CONTRAST; MRI OF THE RIGHT
FOREFOOT WITHOUT CONTRAST
TECHNIQUE: Multiplanar, multisequence MR imaging of the right lower leg was
performed. No intravenous contrast was administered.
Multiplanar, multisequence MR imaging of the right foot was

[Series 7: T1 · coronal · right · 3.0mm · 0.70mm/px · 7 of 44 slices shown (1 of 3)]
[im 1/44]
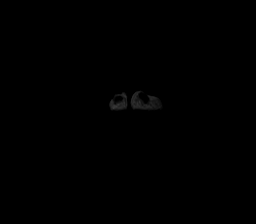
[im 8/44]
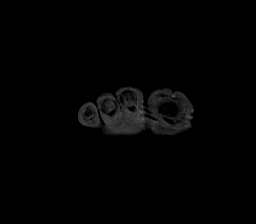
[im 15/44]
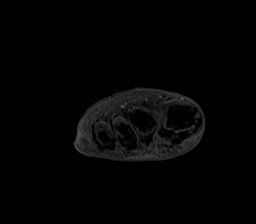
[im 22/44]
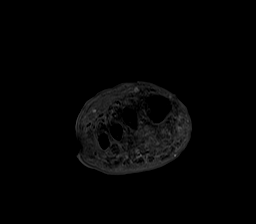
[im 29/44]
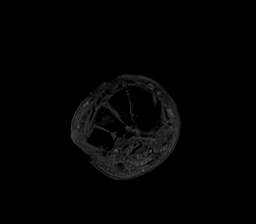
[im 36/44]
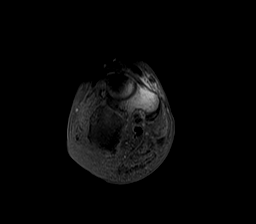
[im 44/44]
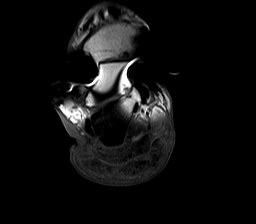

[Series 9: T2 fat-sat · coronal · right · 3.0mm · 0.47mm/px · 7 of 44 slices shown (1 of 3)]
[im 1/44]
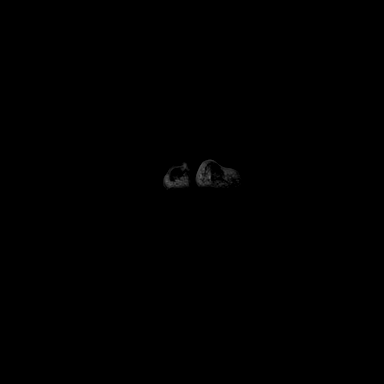
[im 8/44]
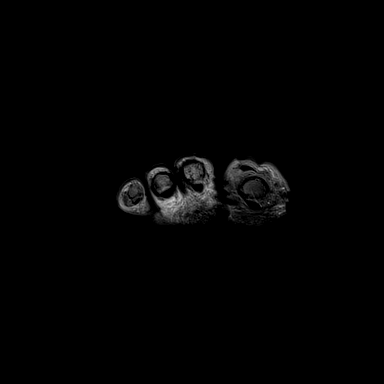
[im 15/44]
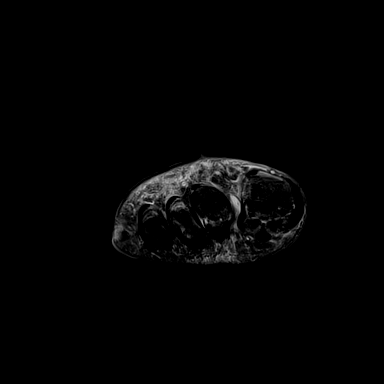
[im 22/44]
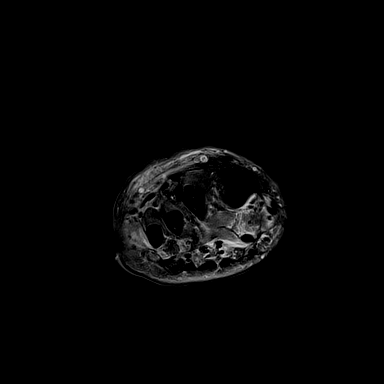
[im 29/44]
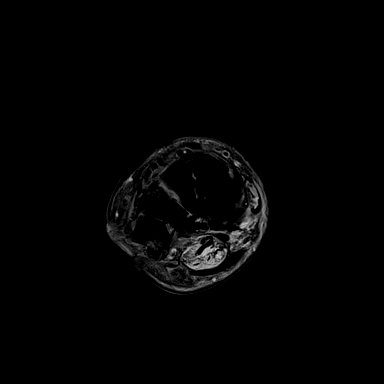
[im 36/44]
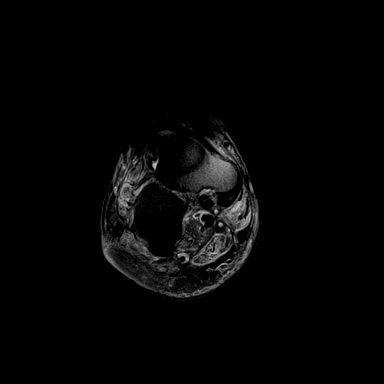
[im 44/44]
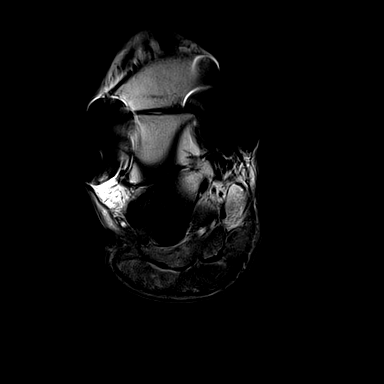

[Series 12: STIR · sagittal · right · 4.0mm · 0.43mm/px · 3 of 21 slices shown (1 of 3)]
[im 1/21]
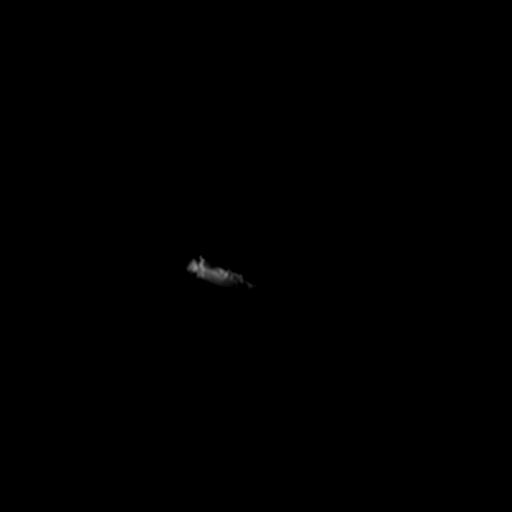
[im 11/21]
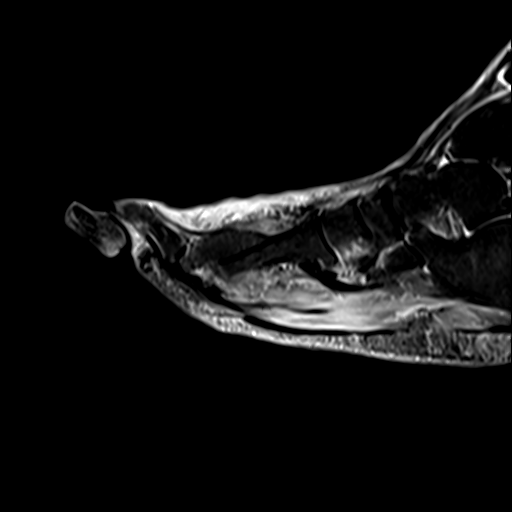
[im 21/21]
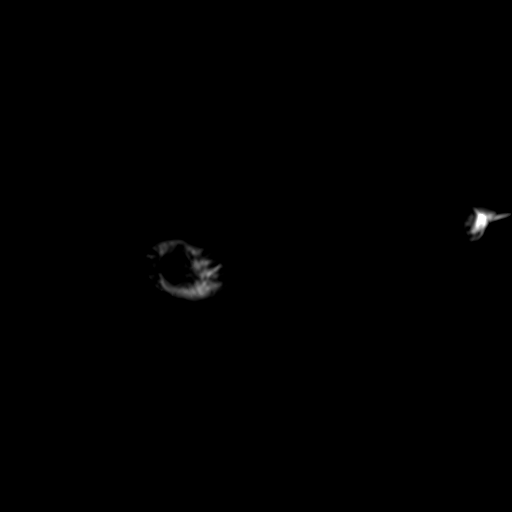

[Series 19: T1 · sagittal · right · 3.0mm · 1.84mm/px · 4 of 26 slices shown (2 of 3)]
[im 1/26]
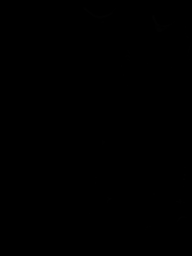
[im 9/26]
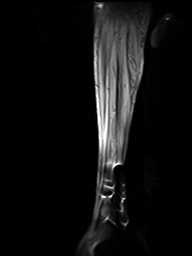
[im 17/26]
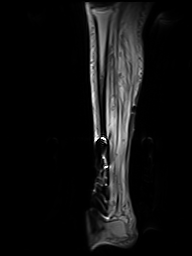
[im 26/26]
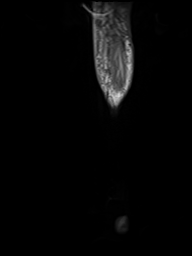

[Series 20: T2 fat-sat · sagittal · right · 3.0mm · 1.84mm/px · 4 of 26 slices shown (2 of 3)]
[im 1/26]
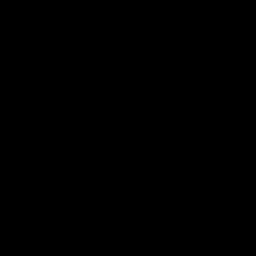
[im 9/26]
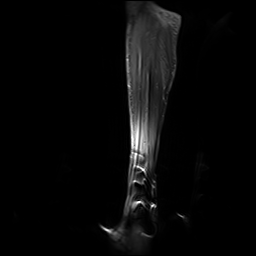
[im 17/26]
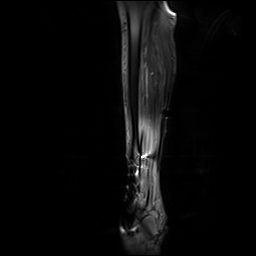
[im 26/26]
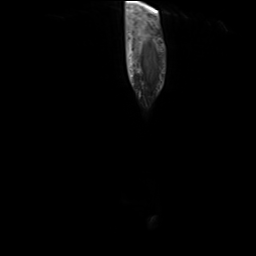

[Series 21: STIR · coronal · right · 4.0mm · 0.92mm/px · 3 of 21 slices shown (2 of 3)]
[im 1/21]
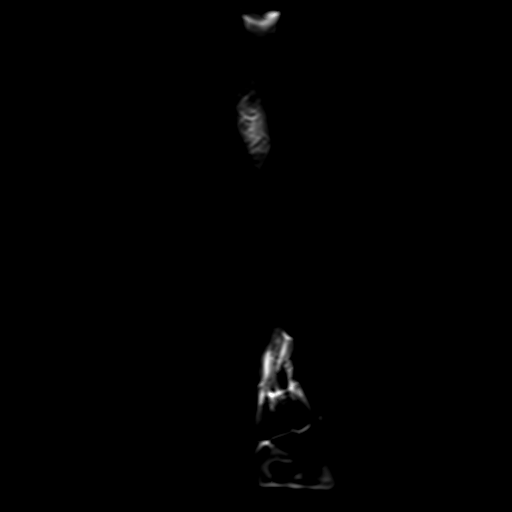
[im 11/21]
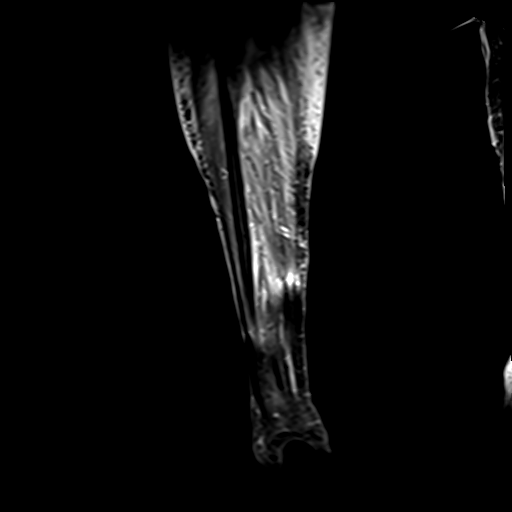
[im 21/21]
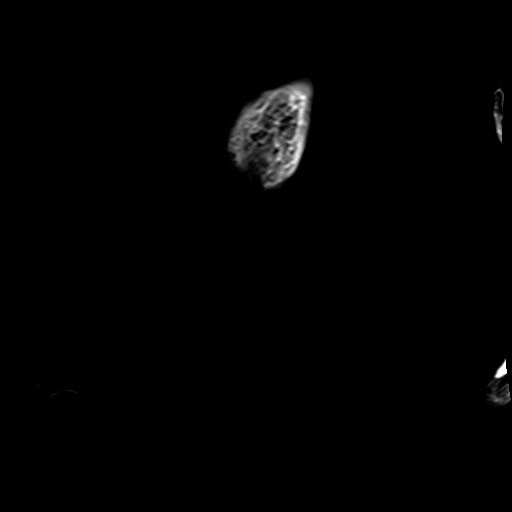

[Series 22: STIR · sagittal · right · 3.0mm · 0.92mm/px · 2 of 26 slices shown (3 of 3)]
[im 1/26]
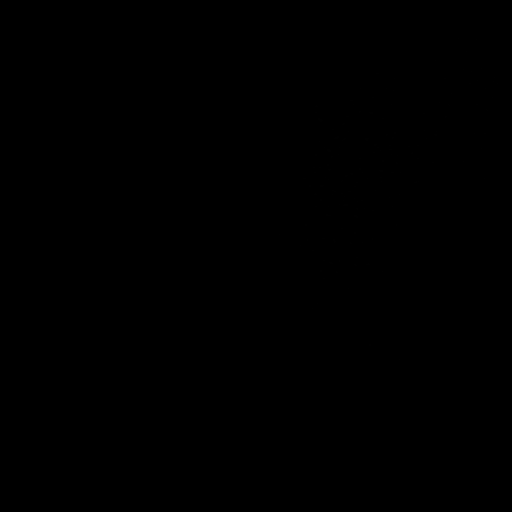
[im 9/26]
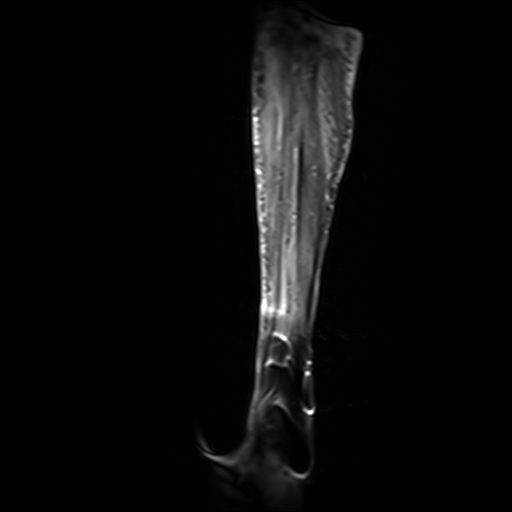

[Series 1004: T1 · oblique · right · 3.0mm · 0.94mm/px · 4 of 26 slices shown (3 of 3)]
[im 1/26]
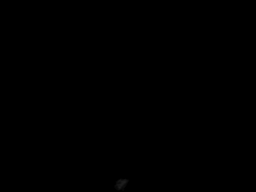
[im 9/26]
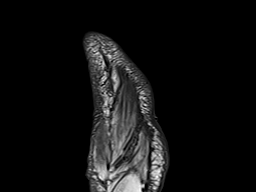
[im 17/26]
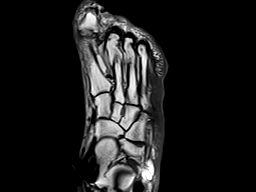
[im 26/26]
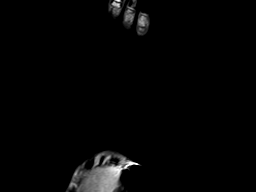

[Series 1006: T2 fat-sat · oblique · right · 3.0mm · 0.94mm/px · 4 of 26 slices shown (3 of 3)]
[im 1/26]
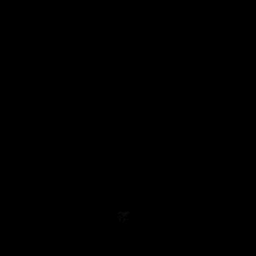
[im 9/26]
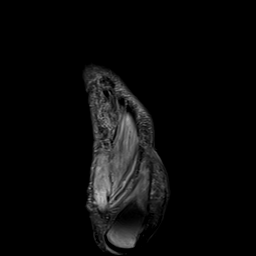
[im 17/26]
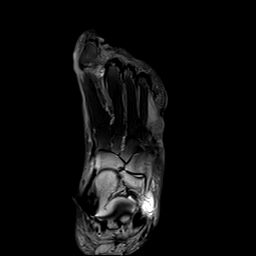
[im 26/26]
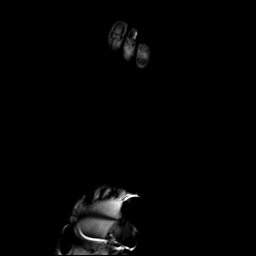

[38 of 40 positions shown; findings below may reference images not displayed]

FINDINGS: Bones/Joint/Cartilage

Soft tissue wound overlying the lateral aspect of the cuboid with
mild subcortical bone marrow edema in the distal lateral corner
concerning for osteomyelitis.

Orthopedic hardware in the distal fibula and tibia with resulting
susceptibility artifact partially obscuring the adjacent soft tissue
and osseous structures.

No fracture or dislocation. Old posttraumatic deformity of the
second metatarsal. Normal alignment. No joint effusion. Small amount
of fluid in the first intermetatarsal bursa.

Severe osteoarthritis of the first MTP joint. Moderate
osteoarthritis of the first IP joint. Mild osteoarthritis of the
remainder of the MTP joints. Prior fifth metatarsal amputation. Mild
osteoarthritis of the first TMT joint.

Ligaments

Collateral ligaments are intact.  Lisfranc ligament is intact.

Muscles and Tendons

Flexor, peroneal and extensor compartment tendons are intact. T2
hyperintense signal in the plantar musculature likely neurogenic.
Generalized muscle atrophy of the right lower leg.

Soft tissue
No fluid collection or hematoma. No soft tissue mass. Soft tissue
edema throughout the right lower leg and foot which may be reactive
secondary to fluid overload/venous insufficiency versus cellulitis.
No drainable fluid collection to suggest an abscess.
IMPRESSION: 1. Soft tissue wound overlying the lateral aspect of the cuboid with
mild subcortical bone marrow edema in the distal lateral corner
concerning for osteomyelitis.
2. Soft tissue edema throughout the right lower leg and foot which
may be reactive secondary to fluid overload/venous insufficiency
versus cellulitis. No drainable fluid collection to suggest an
abscess.
3. Severe osteoarthritis of the first MTP joint.

## 2023-10-06 ENCOUNTER — Telehealth: Payer: Self-pay | Admitting: Orthopedic Surgery

## 2023-10-06 NOTE — Telephone Encounter (Signed)
[facility] called stating they will need a physical handwritten signature on the paperwork for pt it is being e-faxed currently and needs to be sent back they cannot accept stamped signatures.

## 2023-10-06 NOTE — Telephone Encounter (Signed)
Noted  

## 2023-10-16 ENCOUNTER — Ambulatory Visit (INDEPENDENT_AMBULATORY_CARE_PROVIDER_SITE_OTHER): Payer: Medicare Other | Admitting: Orthopedic Surgery

## 2023-10-16 ENCOUNTER — Ambulatory Visit: Payer: Medicare Other | Admitting: Orthopedic Surgery

## 2023-10-16 ENCOUNTER — Encounter: Payer: Self-pay | Admitting: Orthopedic Surgery

## 2023-10-16 ENCOUNTER — Telehealth: Payer: Self-pay | Admitting: Orthopedic Surgery

## 2023-10-16 DIAGNOSIS — L97911 Non-pressure chronic ulcer of unspecified part of right lower leg limited to breakdown of skin: Secondary | ICD-10-CM

## 2023-10-16 DIAGNOSIS — I739 Peripheral vascular disease, unspecified: Secondary | ICD-10-CM | POA: Diagnosis not present

## 2023-10-16 DIAGNOSIS — I890 Lymphedema, not elsewhere classified: Secondary | ICD-10-CM | POA: Diagnosis not present

## 2023-10-16 DIAGNOSIS — L97923 Non-pressure chronic ulcer of unspecified part of left lower leg with necrosis of muscle: Secondary | ICD-10-CM

## 2023-10-16 NOTE — Progress Notes (Signed)
Office Visit Note   Patient: Amy Moses           Date of Birth: [birth year]           MRN: [id number] Visit Date: 10/16/2023              Requested by: Lorelei Pont, DO [address] [place],  [place] [address] PCP: Lorelei Pont, DO  Chief Complaint  Patient presents with   Right Foot - Wound Check   Left Leg - Edema   Right Leg - Edema      HPI: Patient is a 52 year old woman with calciphylaxis and chronic wounds both lower extremities.  Patient has home health nursing changes 2 times a week.  Assessment & Plan: Visit Diagnoses:  1. Lymphedema   2. Calciphylaxis of right lower extremity with nonhealing ulcer, limited to breakdown of skin (HCC)   3. Calciphylaxis of left lower extremity with nonhealing ulcer with necrosis of muscle (HCC)   4. PVD (peripheral vascular disease) (HCC)     Plan: Dynaflex wraps were reapplied continue with wound care.  Follow-Up Instructions: No follow-ups on file.   Ortho Exam  Patient is alert, oriented, no adenopathy, well-dressed, normal affect, normal respiratory effort. Examination the plantar right foot wound is essentially healed it is flat there is no drainage no cellulitis.  There are no open ulcers on either leg.  Patient has dermatitis with venous and lymphatic insufficiency.  Imaging: No results found. No images are attached to the encounter.  Labs: Lab Results  Component Value Date   HGBA1C 9.0 (H) 08/02/2023   HGBA1C 9.4 (H) 10/01/2022   HGBA1C 11.5 (H) 02/15/2022   ESRSEDRATE 7 08/03/2023   ESRSEDRATE 34 (H) 02/14/2022   ESRSEDRATE 40 (H) 08/07/2021   CRP 1.7 (H) 08/03/2023   CRP 7.0 (H) 02/14/2022   CRP 11.1 (H) 08/07/2021   REPTSTATUS 08/07/2023 FINAL 08/02/2023   GRAMSTAIN  07/08/2021    RARE WBC PRESENT,BOTH PMN AND MONONUCLEAR RARE GRAM POSITIVE RODS RARE GRAM POSITIVE COCCI IN PAIRS    CULT  08/02/2023    NO GROWTH 5 DAYS Performed at [facility], [address] [place], [place]  [address]    Imelda Pillow CITROBACTER KOSERI 04/28/2022     Lab Results  Component Value Date   ALBUMIN 3.4 (L) 08/08/2023   ALBUMIN 3.5 08/05/2023   ALBUMIN 3.3 (L) 08/03/2023   PREALBUMIN 22 08/03/2023   PREALBUMIN 23 08/02/2023   PREALBUMIN 13.7 (L) 08/07/2021    Lab Results  Component Value Date   MG 2.2 08/03/2023   MG 1.9 03/24/2023   MG 2.4 10/01/2022   Lab Results  Component Value Date   VD25OH 36.9 12/30/2021    Lab Results  Component Value Date   PREALBUMIN 22 08/03/2023   PREALBUMIN 23 08/02/2023   PREALBUMIN 13.7 (L) 08/07/2021      Latest Ref Rng & Units 08/08/2023    9:08 AM 08/05/2023   11:30 AM 08/04/2023    5:58 AM  CBC EXTENDED  WBC 4.0 - 10.5 K/uL 7.9  7.8  6.7   RBC 3.87 - 5.11 MIL/uL 3.64  3.61  3.82   Hemoglobin 12.0 - 15.0 g/dL 50.0  93.8  18.2   HCT 36.0 - 46.0 % 33.8  34.3  35.7   Platelets 150 - 400 K/uL 123  106  95   NEUT# 1.7 - 7.7 K/uL 6.0     Lymph# 0.7 - 4.0 K/uL 0.9  There is no height or weight on file to calculate BMI.  Orders:  No orders of the defined types were placed in this encounter.  No orders of the defined types were placed in this encounter.    Procedures: No procedures performed  Clinical Data: No additional findings.  ROS:  All other systems negative, except as noted in the HPI. Review of Systems  Objective: Vital Signs: LMP 08/22/2015 (Approximate)   Specialty Comments:  No specialty comments available.  PMFS History: Patient Active Problem List   Diagnosis Date Noted   Non-pressure chronic ulcer of other part of right foot limited to breakdown of skin (HCC) 08/08/2023   PAD (peripheral artery disease) (HCC) 08/08/2023   Acute osteomyelitis of right foot (HCC) 08/04/2023   OSA (obstructive sleep apnea) 08/02/2023   Prolapsed internal hemorrhoids, grade 3 03/29/2023   Thrombocytopenia (HCC) 03/21/2023   Mitral regurgitation 11/16/2022   Pressure injury of skin 10/01/2022   Pulmonary  hypertension, unspecified (HCC) 09/12/2022   GERD (gastroesophageal reflux disease) 04/30/2022   Non-adherence to medical treatment 03/16/2022   Cutaneous abscess of right foot    Osteomyelitis of foot (HCC) 02/14/2022   Obesity (BMI 30-39.9) 01/26/2022   Ischemic ulcer of right foot (HCC) 01/24/2022   Anemia of chronic kidney failure, stage 5 (HCC) 01/24/2022   Sacral pressure ulcer 01/24/2022   Leg wound, left, sequela 12/10/2021   Open leg wound 09/03/2021   Wound infection    Non-pressure chronic ulcer of right calf limited to breakdown of skin (HCC)    Calciphylaxis of right lower extremity with nonhealing ulcer, limited to breakdown of skin (HCC)    Chronic ulcer of left thigh (HCC) 08/07/2021   Calciphylaxis of left lower extremity with nonhealing ulcer with necrosis of muscle (HCC) 08/07/2021   Chronic diastolic heart failure (HCC) 03/30/2021   ESRD (end stage renal disease) on dialysis (HCC) 11/16/2020   Calciphylaxis 11/06/2020   Non-healing open wound of heel 11/03/2020   Diabetic foot infection (HCC) 11/01/2020   Decubitus ulcer, heel 11/01/2020   Closed nondisplaced fracture of left patella 10/29/2020   Acute on chronic renal failure (HCC) 06/10/2020   Anemia of chronic disease 06/10/2020   Vitamin D deficiency 01/28/2019   Type 2 diabetes mellitus with ESRD (end-stage renal disease) (HCC) 09/21/2015   Mixed hyperlipidemia 09/21/2015   Primary hypertension 09/21/2015   Acquired hypothyroidism 09/21/2015   Iris bomb 07/31/2012   Secondary angle-closure glaucoma 07/31/2012   Past Medical History:  Diagnosis Date   Anemia    Blindness of right eye with low vision in contralateral eye    s/p victrectomy   Chronic diastolic heart failure (HCC) 03/30/2021   Diabetes mellitus, type II (HCC)    Dyslipidemia    Glaucoma    History of blood transfusion    Hypertension    Hypothyroidism (acquired)    Kidney disease    Stage 5   Mitral regurgitation 11/16/2022    Pneumonia    Pulmonary hypertension, unspecified (HCC) 09/12/2022    Family History  Problem Relation Age of Onset   Heart failure Mother    Heart disease Mother    Diabetes Mother    Kidney disease Mother    Heart failure Father    Diabetes Father    Heart disease Father    Diabetes Brother    Heart failure Maternal Grandmother    Heart failure Maternal Grandfather    Transient ischemic attack Maternal Grandfather    Colon cancer Neg Hx  Past Surgical History:  Procedure Laterality Date   ABDOMINAL AORTOGRAM W/LOWER EXTREMITY Bilateral 12/18/2020   Procedure: ABDOMINAL AORTOGRAM W/LOWER EXTREMITY;  Surgeon: Sherren Kerns, MD;  [facility];  Service: Cardiovascular;  Laterality: Bilateral;   ABDOMINAL AORTOGRAM W/LOWER EXTREMITY Bilateral 01/25/2022   Procedure: ABDOMINAL AORTOGRAM W/LOWER EXTREMITY;  Surgeon: Nada Libman, MD;  Location: [facility];  Service: Cardiovascular;  Laterality: Bilateral;   ABDOMINAL AORTOGRAM W/LOWER EXTREMITY Right 08/04/2023   Procedure: ABDOMINAL AORTOGRAM W/LOWER EXTREMITY;  Surgeon: Leonie Douglas, MD;  [facility]: [facility];  Service: Cardiovascular;  Laterality: Right;   AMPUTATION Right 02/16/2022   Procedure: RIGHT FOOT 5TH RAY AMPUTATION;  Surgeon: Nadara Mustard, MD;  Location: [facility];  Service: Orthopedics;  Laterality: Right;   ANKLE FRACTURE SURGERY Right    AV FISTULA PLACEMENT Left 08/18/2020   Procedure: LEFT ARM BRACHIOCEPHALIC ARTERIOVENOUS (AV) FISTULA CREATION;  Surgeon: Sherren Kerns, MD;  Location: [facility];  Service: Vascular;  Laterality: Left;   BIOPSY  04/24/2021   Procedure: BIOPSY;  Surgeon: Lanelle Bal, DO;  [facility];  Service: Endoscopy;;   CESAREAN SECTION     CHOLECYSTECTOMY     COLONOSCOPY  04/24/2021   Surgeon: Lanelle Bal, DO;  nonbleeding internal hemorrhoids, 1 large (25 mm) pedunculated transverse colon polyp (prolapse type polyp) with adherent  clot and stigmata of recent bleed.   COLONOSCOPY WITH PROPOFOL N/A 05/14/2021   Procedure: COLONOSCOPY WITH PROPOFOL;  Surgeon: Corbin Ade, MD;  [facility];  Service: Endoscopy;  Laterality: N/A;   COLONOSCOPY WITH PROPOFOL N/A 03/17/2023   Procedure: COLONOSCOPY WITH PROPOFOL;  Surgeon: Corbin Ade, MD;  [facility];  Service: Endoscopy;  Laterality: N/A;   ESOPHAGOGASTRODUODENOSCOPY (EGD) WITH PROPOFOL N/A 04/24/2021   Surgeon: Lanelle Bal, DO;  duodenal erosions and gastritis biopsied (pathology with peptic duodenitis, reactive gastropathy with erosions/chronic inflammation, negative for H. pylori)   EYE SURGERY     Vatrectomy   HEMOSTASIS CLIP PLACEMENT  05/14/2021   Procedure: HEMOSTASIS CLIP PLACEMENT;  Surgeon: Corbin Ade, MD;  [facility];  Service: Endoscopy;;   IR PERC TUN PERIT CATH WO PORT S&I /IMAG  09/15/2020   IR REMOVAL TUN CV CATH W/O FL  02/19/2021   IR US GUIDE VASC ACCESS RIGHT  09/15/2020   POLYPECTOMY  04/24/2021   Procedure: POLYPECTOMY;  Surgeon: Lanelle Bal, DO;  [facility];  Service: Endoscopy;;   POLYPECTOMY  05/14/2021   Procedure: POLYPECTOMY;  Surgeon: Corbin Ade, MD;  [facility];  Service: Endoscopy;;   POLYPECTOMY  03/17/2023   Procedure: POLYPECTOMY;  Surgeon: Corbin Ade, MD;  [facility];  Service: Endoscopy;;   SKIN SPLIT GRAFT Bilateral 09/03/2021   Procedure: SKIN GRAFT BILATERAL LEGS;  Surgeon: Nadara Mustard, MD;  Location: [facility];  Service: Orthopedics;  Laterality: Bilateral;   SKIN SPLIT GRAFT Left 12/10/2021   Procedure: IRRIGATION AND DEBRIDEMENT LEFT CALF, APPLICATION SPLIT THICKNESS SKIN GRAFT;  Surgeon: Nadara Mustard, MD;  Location: [facility];  Service: Orthopedics;  Laterality: Left;   TOE SURGERY     Social History   Occupational History   Not on file  Tobacco Use   Smoking status: Never   Smokeless tobacco: Never  Vaping Use    Vaping status: Never Used  Substance and Sexual Activity   Alcohol use: No   Drug use:  No   Sexual activity: Yes    Birth control/protection: Condom

## 2023-10-16 NOTE — Telephone Encounter (Signed)
Can you please call pt and offer appt next available with either Dr. Lajoyce Corners or Denny Peon? Thanks! I already cx appt for today per pt request.

## 2023-10-16 NOTE — Telephone Encounter (Signed)
Pt called stating she need to cancel this week and asking to come next week. Please call pt about this matter at [phone].

## 2023-10-23 ENCOUNTER — Ambulatory Visit: Payer: Medicare Other | Admitting: Orthopedic Surgery

## 2023-10-24 ENCOUNTER — Encounter: Payer: Self-pay | Admitting: Gastroenterology

## 2023-10-24 ENCOUNTER — Ambulatory Visit (INDEPENDENT_AMBULATORY_CARE_PROVIDER_SITE_OTHER): Payer: Medicare Other | Admitting: Gastroenterology

## 2023-10-24 VITALS — BP 128/77 | HR 71 | Temp 98.2°F | Ht 66.0 in | Wt 205.0 lb

## 2023-10-24 DIAGNOSIS — K625 Hemorrhage of anus and rectum: Secondary | ICD-10-CM | POA: Diagnosis not present

## 2023-10-24 DIAGNOSIS — K642 Third degree hemorrhoids: Secondary | ICD-10-CM

## 2023-10-24 DIAGNOSIS — R197 Diarrhea, unspecified: Secondary | ICD-10-CM

## 2023-10-24 MED ORDER — HYDROCORTISONE (PERIANAL) 2.5 % EX CREA: 1.0000 | TOPICAL_CREAM | Freq: Two times a day (BID) | CUTANEOUS | 1 refills | Status: DC

## 2023-10-24 NOTE — Progress Notes (Signed)
Gastroenterology Office Note     Primary Care Physician:  Lorelei Pont, DO  Primary Gastroenterologist: Dr. Marletta Lor   Chief Complaint   Chief Complaint  Patient presents with   Follow-up    Follow up bleeding with her BM's for about a week     History of Present Illness   Amy Moses is a 52 y.o. female presenting today with a history of blindess in right eye, chornic diastolic CHF, type 2 diabetes, dyslipidemia, HTN, hypothyroidism, ESRD on HD, pulmonary HTN, colon polyps with history of post polypectomy bleed April 2024, known grade 3 hemorrhoid s/p banding of all columns earlier this year, presenting with rectal bleeding and worsening diarrhea for the past week.   Very seldom constipation. Has more frequent looser stools, some textures to it. Bleeding intermittently with this lately. Baseline for her is postprandial after eating. Just worsened. Yesterday was black but is on iron. Some itching/burning. Stools 2-3 times per day. No cramping. Lots of gas. Taking gas-x. Takes lomotil very seldom. Prescribed by Nephrology. Stool sometimes shiny. No recent abx.   Last colonoscopy March 17, 2023 with prominent Grade 3 hemorrhoids,      Past Medical History:  Diagnosis Date   Anemia    Blindness of right eye with low vision in contralateral eye    s/p victrectomy   Chronic diastolic heart failure (HCC) 03/30/2021   Diabetes mellitus, type II (HCC)    Dyslipidemia    Glaucoma    History of blood transfusion    Hypertension    Hypothyroidism (acquired)    Kidney disease    Stage 5   Mitral regurgitation 11/16/2022   Pneumonia    Pulmonary hypertension, unspecified (HCC) 09/12/2022    Past Surgical History:  Procedure Laterality Date   ABDOMINAL AORTOGRAM W/LOWER EXTREMITY Bilateral 12/18/2020   Procedure: ABDOMINAL AORTOGRAM W/LOWER EXTREMITY;  Surgeon: Sherren Kerns, MD;  [facility];  Service: Cardiovascular;  Laterality: Bilateral;    ABDOMINAL AORTOGRAM W/LOWER EXTREMITY Bilateral 01/25/2022   Procedure: ABDOMINAL AORTOGRAM W/LOWER EXTREMITY;  Surgeon: Nada Libman, MD;  [facility];  Service: Cardiovascular;  Laterality: Bilateral;   ABDOMINAL AORTOGRAM W/LOWER EXTREMITY Right 08/04/2023   Procedure: ABDOMINAL AORTOGRAM W/LOWER EXTREMITY;  Surgeon: Leonie Douglas, MD;  [facility];  Service: Cardiovascular;  Laterality: Right;   AMPUTATION Right 02/16/2022   Procedure: RIGHT FOOT 5TH RAY AMPUTATION;  Surgeon: Nadara Mustard, MD;  Location: [facility] OR;  Service: Orthopedics;  Laterality: Right;   ANKLE FRACTURE SURGERY Right    AV FISTULA PLACEMENT Left 08/18/2020   Procedure: LEFT ARM BRACHIOCEPHALIC ARTERIOVENOUS (AV) FISTULA CREATION;  Surgeon: Sherren Kerns, MD;  Location: [name] OR;  Service: Vascular;  Laterality: Left;   BIOPSY  04/24/2021   Procedure: BIOPSY;  Surgeon: Lanelle Bal, DO;  [facility];  Service: Endoscopy;;   CESAREAN SECTION     CHOLECYSTECTOMY     COLONOSCOPY  04/24/2021   Surgeon: Lanelle Bal, DO;  nonbleeding internal hemorrhoids, 1 large (25 mm) pedunculated transverse colon polyp (prolapse type polyp) with adherent clot and stigmata of recent bleed.   COLONOSCOPY WITH PROPOFOL N/A 05/14/2021   Procedure: COLONOSCOPY WITH PROPOFOL;  Surgeon: Corbin Ade, MD;  [facility];  Service: Endoscopy;  Laterality: N/A;   COLONOSCOPY WITH PROPOFOL N/A 03/17/2023   Procedure: COLONOSCOPY WITH PROPOFOL;  Surgeon: Corbin Ade, MD;  [facility];  Service: Endoscopy;  Laterality: N/A;   ESOPHAGOGASTRODUODENOSCOPY (EGD) WITH PROPOFOL N/A 04/24/2021   Surgeon: Lanelle Bal, DO;  duodenal erosions and gastritis biopsied (pathology with peptic duodenitis, reactive gastropathy with erosions/chronic inflammation, negative for H. pylori)   EYE SURGERY     Vatrectomy   HEMOSTASIS CLIP PLACEMENT  05/14/2021   Procedure:  HEMOSTASIS CLIP PLACEMENT;  Surgeon: Corbin Ade, MD;  [facility];  Service: Endoscopy;;   IR PERC TUN PERIT CATH WO PORT S&I /IMAG  09/15/2020   IR REMOVAL TUN CV CATH W/O FL  02/19/2021   IR US GUIDE VASC ACCESS RIGHT  09/15/2020   POLYPECTOMY  04/24/2021   Procedure: POLYPECTOMY;  Surgeon: Lanelle Bal, DO;  [facility];  Service: Endoscopy;;   POLYPECTOMY  05/14/2021   Procedure: POLYPECTOMY;  Surgeon: Corbin Ade, MD;  [facility];  Service: Endoscopy;;   POLYPECTOMY  03/17/2023   Procedure: POLYPECTOMY;  Surgeon: Corbin Ade, MD;  [facility];  Service: Endoscopy;;   SKIN SPLIT GRAFT Bilateral 09/03/2021   Procedure: SKIN GRAFT BILATERAL LEGS;  Surgeon: Nadara Mustard, MD;  Location: [facility] OR;  Service: Orthopedics;  Laterality: Bilateral;   SKIN SPLIT GRAFT Left 12/10/2021   Procedure: IRRIGATION AND DEBRIDEMENT LEFT CALF, APPLICATION SPLIT THICKNESS SKIN GRAFT;  Surgeon: Nadara Mustard, MD;  Location: [facility];  Service: Orthopedics;  Laterality: Left;   TOE SURGERY      Current Outpatient Medications  Medication Sig Dispense Refill   acetaminophen (TYLENOL) 500 MG tablet Take 1,000 mg by mouth every 6 (six) hours as needed for moderate pain.     atorvastatin (LIPITOR) 10 MG tablet Take 10 mg by mouth every evening.     AURYXIA 1 GM 210 MG(Fe) tablet Take 630 mg by mouth 3 (three) times daily with meals.     Blood Glucose Monitoring Suppl (ACCU-CHEK GUIDE ME) w/Device KIT 1 Piece by Does not apply route as directed. 1 kit 0   Blood Pressure Monitor KIT TAKE BLOOD PRESSURE DAILY 1 kit 0   Continuous Blood Gluc Receiver (FREESTYLE LIBRE 2 READER) DEVI As directed 1 each 0   Continuous Blood Gluc Sensor (FREESTYLE LIBRE 2 SENSOR) MISC 1 Piece by Does not apply route every 14 (fourteen) days. 2 each 3   diphenoxylate-atropine (LOMOTIL) 2.5-0.025 MG tablet Take 2 tablets by mouth 4 (four) times daily as needed for diarrhea or  loose stools.     glucose blood (ACCU-CHEK GUIDE) test strip Use as instructed 150 each 2   HUMALOG KWIKPEN 100 UNIT/ML KwikPen Inject 5-11 Units into the skin 3 (three) times daily with meals. If eats 50% or more of meal. (Patient taking differently: Inject 5-11 Units into the skin 3 (three) times daily with meals. If eats 50% or more of meal. Sliding scale) 15 mL 1   hydrocortisone (ANUSOL-HC) 2.5 % rectal cream Place 1 Application rectally 2 (two) times daily. 30 g 1   insulin glargine (LANTUS) 100 UNIT/ML injection Inject 0.2 mLs (20 Units total) into the skin at bedtime. (Patient taking differently: Inject 8 Units into the skin at bedtime.) 10 mL 1   levothyroxine (SYNTHROID) 75 MCG tablet Take 75 mcg by mouth daily.     midodrine (PROAMATINE) 10 MG tablet Take 10 mg by mouth daily.     pantoprazole (PROTONIX) 40 MG tablet Take 1 tablet (40 mg total) by mouth 2 (two) times daily. 60 tablet 5   polyethylene glycol powder (MIRALAX) 17  GM/SCOOP powder Take 17 g by mouth daily. 850 g 0   Vitamin D, Ergocalciferol, (DRISDOL) 1.25 MG (50000 UNIT) CAPS capsule Take 50,000 Units by mouth every 7 (seven) days. Friday     No current facility-administered medications for this visit.    Allergies as of 10/24/2023 - Review Complete 10/24/2023  Allergen Reaction Noted   Ace inhibitors Cough 11/19/2020    Family History  Problem Relation Age of Onset   Heart failure Mother    Heart disease Mother    Diabetes Mother    Kidney disease Mother    Heart failure Father    Diabetes Father    Heart disease Father    Diabetes Brother    Heart failure Maternal Grandmother    Heart failure Maternal Grandfather    Transient ischemic attack Maternal Grandfather    Colon cancer Neg Hx     Social History   Socioeconomic History   Marital status: Single    Spouse name: Not on file   Number of children: Not on file   Years of education: Not on file   Highest education level: Not on file  Occupational  History   Not on file  Tobacco Use   Smoking status: Never   Smokeless tobacco: Never  Vaping Use   Vaping status: Never Used  Substance and Sexual Activity   Alcohol use: No   Drug use: No   Sexual activity: Yes    Birth control/protection: Condom  Other Topics Concern   Not on file  Social History Narrative   Not on file   Social Determinants of Health   Financial Resource Strain: Not on file  Food Insecurity: Food Insecurity Present (08/03/2023)   Hunger Vital Sign    Worried About Running Out of Food in the Last Year: Sometimes true    Ran Out of Food in the Last Year: Sometimes true  Transportation Needs: Unmet Transportation Needs (08/03/2023)   PRAPARE - Administrator, Civil Service (Medical): Yes    Lack of Transportation (Non-Medical): Yes  Physical Activity: Not on file  Stress: Not on file  Social Connections: Not on file  Intimate Partner Violence: Not At Risk (08/03/2023)   Humiliation, Afraid, Rape, and Kick questionnaire    Fear of Current or Ex-Partner: No    Emotionally Abused: No    Physically Abused: No    Sexually Abused: No     Review of Systems   Gen: Denies any fever, chills, fatigue, weight loss, lack of appetite.  CV: Denies chest pain, heart palpitations, peripheral edema, syncope.  Resp: Denies shortness of breath at rest or with exertion. Denies wheezing or cough.  GI: Denies dysphagia or odynophagia. Denies jaundice, hematemesis, fecal incontinence. GU : Denies urinary burning, urinary frequency, urinary hesitancy MS: Denies joint pain, muscle weakness, cramps, or limitation of movement.  Derm: Denies rash, itching, dry skin Psych: Denies depression, anxiety, memory loss, and confusion Heme: Denies bruising, bleeding, and enlarged lymph nodes.   Physical Exam   BP 128/77   Pulse 71   Temp 98.2 F (36.8 C)   Ht 5\' 6"  (1.676 m)   Wt 205 lb (93 kg)   LMP 08/22/2015 (Approximate)   BMI 33.09 kg/m  General:   Alert and  oriented. Pleasant and cooperative. Well-nourished and well-developed.  Head:  Normocephalic and atraumatic. Eyes:  Without icterus Rectal:  prolapsing internal hemorrhoid, no mass on DRE Msk:  bilateral lymphedema, uses wheelchair, able to self transfer Neurologic:  Alert and  oriented x4 Skin:  Intact without significant lesions or rashes. Psych:  Alert and cooperative. Normal mood and affect.   Assessment   Amy Moses is a 52 y.o. female presenting today with a history of blindess in right eye, chornic diastolic CHF, type 2 diabetes, dyslipidemia, HTN, hypothyroidism, ESRD on HD, pulmonary HTN, colon polyps with history of post polypectomy bleed April 2024, known grade 3 hemorrhoid s/p banding of all columns earlier this year, presenting with rectal bleeding and worsening diarrhea for the past week.    Will need to rule out superimposed infectious process. Colonoscopy up-to-date as of April 2024 noted above. Rectal bleeding due to internal hemorrhoids.    PLAN    Stool studies Anusol cream prn 3 month follow-up Consider Xifaxan If no improvement with Xifaxan, could consider pancreatic enzymes   Gelene Mink, PhD, ANP-BC [facility]   Addendum: Cdiff positive on GI path and Cdiff toxin assay. I called patient, who stated she is presenting to the emergency room due to significant fatigue and weakness. See result note. Gelene Mink, PhD, ANP-BC [name]

## 2023-10-24 NOTE — Patient Instructions (Signed)
You can go to [facility] to get the stool containers. Please complete when you are able! [address] [place], [place] [address]  I have sent in a cream to use twice a day per rectum for rectal bleeding and itching.  We will see what stool tests show and go from there!  Have a wonderful holiday season!  I enjoyed seeing you again today! I value our relationship and want to provide genuine, compassionate, and quality care. You may receive a survey regarding your visit with me, and I welcome your feedback! Thanks so much for taking the time to complete this. I look forward to seeing you again.      Gelene Mink, PhD, ANP-BC [facility]

## 2023-10-28 LAB — CLOSTRIDIUM DIFFICILE EIA: C difficile Toxins A+B, EIA: POSITIVE — AB

## 2023-10-29 LAB — GI PROFILE, STOOL, PCR

## 2023-10-30 ENCOUNTER — Encounter (HOSPITAL_COMMUNITY): Payer: Self-pay | Admitting: *Deleted

## 2023-10-30 ENCOUNTER — Inpatient Hospital Stay (HOSPITAL_COMMUNITY)
Admission: EM | Admit: 2023-10-30 | Discharge: 2023-11-03 | DRG: 371 | Disposition: A | Discharge status: Home Health | Payer: Medicare Other | Attending: Internal Medicine | Admitting: Internal Medicine

## 2023-10-30 ENCOUNTER — Other Ambulatory Visit: Payer: Self-pay

## 2023-10-30 DIAGNOSIS — Z5982 Transportation insecurity: Secondary | ICD-10-CM

## 2023-10-30 DIAGNOSIS — E039 Hypothyroidism, unspecified: Secondary | ICD-10-CM | POA: Diagnosis present

## 2023-10-30 DIAGNOSIS — N2581 Secondary hyperparathyroidism of renal origin: Secondary | ICD-10-CM | POA: Diagnosis present

## 2023-10-30 DIAGNOSIS — K219 Gastro-esophageal reflux disease without esophagitis: Secondary | ICD-10-CM | POA: Diagnosis present

## 2023-10-30 DIAGNOSIS — Z91158 Patient's noncompliance with renal dialysis for other reason: Secondary | ICD-10-CM

## 2023-10-30 DIAGNOSIS — Z888 Allergy status to other drugs, medicaments and biological substances status: Secondary | ICD-10-CM

## 2023-10-30 DIAGNOSIS — E1122 Type 2 diabetes mellitus with diabetic chronic kidney disease: Secondary | ICD-10-CM | POA: Diagnosis present

## 2023-10-30 DIAGNOSIS — Z8249 Family history of ischemic heart disease and other diseases of the circulatory system: Secondary | ICD-10-CM

## 2023-10-30 DIAGNOSIS — Z91411 Personal history of adult psychological abuse: Secondary | ICD-10-CM

## 2023-10-30 DIAGNOSIS — E119 Type 2 diabetes mellitus without complications: Secondary | ICD-10-CM | POA: Diagnosis present

## 2023-10-30 DIAGNOSIS — Z833 Family history of diabetes mellitus: Secondary | ICD-10-CM

## 2023-10-30 DIAGNOSIS — Z5941 Food insecurity: Secondary | ICD-10-CM

## 2023-10-30 DIAGNOSIS — I340 Nonrheumatic mitral (valve) insufficiency: Secondary | ICD-10-CM | POA: Diagnosis present

## 2023-10-30 DIAGNOSIS — I5032 Chronic diastolic (congestive) heart failure: Secondary | ICD-10-CM | POA: Diagnosis present

## 2023-10-30 DIAGNOSIS — A0472 Enterocolitis due to Clostridium difficile, not specified as recurrent: Secondary | ICD-10-CM | POA: Diagnosis not present

## 2023-10-30 DIAGNOSIS — R21 Rash and other nonspecific skin eruption: Secondary | ICD-10-CM | POA: Diagnosis present

## 2023-10-30 DIAGNOSIS — D631 Anemia in chronic kidney disease: Secondary | ICD-10-CM | POA: Diagnosis present

## 2023-10-30 DIAGNOSIS — I132 Hypertensive heart and chronic kidney disease with heart failure and with stage 5 chronic kidney disease, or end stage renal disease: Secondary | ICD-10-CM | POA: Diagnosis present

## 2023-10-30 DIAGNOSIS — D696 Thrombocytopenia, unspecified: Secondary | ICD-10-CM | POA: Diagnosis present

## 2023-10-30 DIAGNOSIS — G4733 Obstructive sleep apnea (adult) (pediatric): Secondary | ICD-10-CM | POA: Diagnosis present

## 2023-10-30 DIAGNOSIS — Z8711 Personal history of peptic ulcer disease: Secondary | ICD-10-CM

## 2023-10-30 DIAGNOSIS — Z79899 Other long term (current) drug therapy: Secondary | ICD-10-CM

## 2023-10-30 DIAGNOSIS — B029 Zoster without complications: Secondary | ICD-10-CM | POA: Diagnosis present

## 2023-10-30 DIAGNOSIS — Z992 Dependence on renal dialysis: Secondary | ICD-10-CM

## 2023-10-30 DIAGNOSIS — N186 End stage renal disease: Secondary | ICD-10-CM | POA: Diagnosis not present

## 2023-10-30 DIAGNOSIS — Z79890 Hormone replacement therapy (postmenopausal): Secondary | ICD-10-CM

## 2023-10-30 DIAGNOSIS — Z841 Family history of disorders of kidney and ureter: Secondary | ICD-10-CM

## 2023-10-30 DIAGNOSIS — H5461 Unqualified visual loss, right eye, normal vision left eye: Secondary | ICD-10-CM | POA: Diagnosis present

## 2023-10-30 DIAGNOSIS — Z993 Dependence on wheelchair: Secondary | ICD-10-CM

## 2023-10-30 DIAGNOSIS — I272 Pulmonary hypertension, unspecified: Secondary | ICD-10-CM | POA: Diagnosis present

## 2023-10-30 DIAGNOSIS — Z860100 Personal history of colon polyps, unspecified: Secondary | ICD-10-CM

## 2023-10-30 DIAGNOSIS — E860 Dehydration: Secondary | ICD-10-CM | POA: Diagnosis present

## 2023-10-30 DIAGNOSIS — E782 Mixed hyperlipidemia: Secondary | ICD-10-CM | POA: Diagnosis present

## 2023-10-30 DIAGNOSIS — Z794 Long term (current) use of insulin: Secondary | ICD-10-CM

## 2023-10-30 LAB — COMPREHENSIVE METABOLIC PANEL
ALT: 22 U/L (ref 0–44)
AST: 19 U/L (ref 15–41)
Albumin: 4.1 g/dL (ref 3.5–5.0)
Alkaline Phosphatase: 126 U/L (ref 38–126)
Anion gap: 15 (ref 5–15)
BUN: 50 mg/dL — ABNORMAL HIGH (ref 6–20)
CO2: 21 mmol/L — ABNORMAL LOW (ref 22–32)
Calcium: 9.2 mg/dL (ref 8.9–10.3)
Chloride: 100 mmol/L (ref 98–111)
Creatinine, Ser: 7.92 mg/dL — ABNORMAL HIGH (ref 0.44–1.00)
GFR, Estimated: 6 mL/min — ABNORMAL LOW (ref >60)
Glucose, Bld: 154 mg/dL — ABNORMAL HIGH (ref 70–99)
Potassium: 4.2 mmol/L (ref 3.5–5.1)
Sodium: 136 mmol/L (ref 135–145)
Total Bilirubin: 0.7 mg/dL (ref <1.2)
Total Protein: 7.1 g/dL (ref 6.5–8.1)

## 2023-10-30 LAB — CBC WITH DIFFERENTIAL/PLATELET
Abs Immature Granulocytes: 0.01 10*3/uL (ref 0.00–0.07)
Basophils Absolute: 0 10*3/uL (ref 0.0–0.1)
Basophils Relative: 1 %
Eosinophils Absolute: 0.2 10*3/uL (ref 0.0–0.5)
Eosinophils Relative: 2 %
HCT: 36.6 % (ref 36.0–46.0)
Hemoglobin: 11.5 g/dL — ABNORMAL LOW (ref 12.0–15.0)
Immature Granulocytes: 0 %
Lymphocytes Relative: 18 %
Lymphs Abs: 1.1 10*3/uL (ref 0.7–4.0)
MCH: 29.6 pg (ref 26.0–34.0)
MCHC: 31.4 g/dL (ref 30.0–36.0)
MCV: 94.1 fL (ref 80.0–100.0)
Monocytes Absolute: 0.5 10*3/uL (ref 0.1–1.0)
Monocytes Relative: 8 %
Neutro Abs: 4.5 10*3/uL (ref 1.7–7.7)
Neutrophils Relative %: 71 %
Platelets: 72 10*3/uL — ABNORMAL LOW (ref 150–400)
RBC: 3.89 MIL/uL (ref 3.87–5.11)
RDW: 13.6 % (ref 11.5–15.5)
WBC: 6.3 10*3/uL (ref 4.0–10.5)
nRBC: 0 % (ref 0.0–0.2)

## 2023-10-30 LAB — GLUCOSE, CAPILLARY: Glucose-Capillary: 151 mg/dL — ABNORMAL HIGH (ref 70–99)

## 2023-10-30 MED ORDER — HEPARIN SODIUM (PORCINE) 5000 UNIT/ML IJ SOLN
5000.0000 [IU] | Freq: Three times a day (TID) | INTRAMUSCULAR | Status: DC
  Administered 2023-10-31 (×2): 5000 [IU] via SUBCUTANEOUS
  Filled 2023-10-30 (×2): qty 1

## 2023-10-30 MED ORDER — SODIUM CHLORIDE 0.9 % IV SOLN: INTRAVENOUS | Status: AC

## 2023-10-30 MED ORDER — INSULIN ASPART 100 UNIT/ML IJ SOLN
0.0000 [IU] | Freq: Every day | INTRAMUSCULAR | Status: DC
  Administered 2023-11-02: 3 [IU] via SUBCUTANEOUS

## 2023-10-30 MED ORDER — LEVOTHYROXINE SODIUM 75 MCG PO TABS
75.0000 ug | ORAL_TABLET | Freq: Every day | ORAL | Status: DC
  Administered 2023-10-31 – 2023-11-03 (×4): 75 ug via ORAL
  Filled 2023-10-30 (×4): qty 1

## 2023-10-30 MED ORDER — ONDANSETRON HCL 4 MG PO TABS: 4.0000 mg | ORAL_TABLET | Freq: Four times a day (QID) | ORAL | Status: DC | PRN

## 2023-10-30 MED ORDER — VALACYCLOVIR HCL 500 MG PO TABS
500.0000 mg | ORAL_TABLET | Freq: Every day | ORAL | Status: DC
  Administered 2023-10-31 – 2023-11-03 (×4): 500 mg via ORAL
  Filled 2023-10-30 (×4): qty 1

## 2023-10-30 MED ORDER — FIDAXOMICIN 200 MG PO TABS
200.0000 mg | ORAL_TABLET | Freq: Two times a day (BID) | ORAL | Status: DC
  Filled 2023-10-30 (×4): qty 1

## 2023-10-30 MED ORDER — ACETAMINOPHEN 650 MG RE SUPP: 650.0000 mg | Freq: Four times a day (QID) | RECTAL | Status: DC | PRN

## 2023-10-30 MED ORDER — ACETAMINOPHEN 325 MG PO TABS
650.0000 mg | ORAL_TABLET | Freq: Four times a day (QID) | ORAL | Status: DC | PRN
  Administered 2023-11-02: 650 mg via ORAL
  Filled 2023-10-30: qty 2

## 2023-10-30 MED ORDER — ONDANSETRON HCL 4 MG/2ML IJ SOLN: 4.0000 mg | Freq: Four times a day (QID) | INTRAMUSCULAR | Status: DC | PRN

## 2023-10-30 MED ORDER — INSULIN ASPART 100 UNIT/ML IJ SOLN
0.0000 [IU] | Freq: Three times a day (TID) | INTRAMUSCULAR | Status: DC
  Administered 2023-10-31 (×2): 2 [IU] via SUBCUTANEOUS
  Administered 2023-11-01: 3 [IU] via SUBCUTANEOUS
  Administered 2023-11-01 – 2023-11-02 (×3): 2 [IU] via SUBCUTANEOUS
  Administered 2023-11-02: 1 [IU] via SUBCUTANEOUS
  Administered 2023-11-02: 2 [IU] via SUBCUTANEOUS
  Administered 2023-11-03: 3 [IU] via SUBCUTANEOUS
  Administered 2023-11-03: 2 [IU] via SUBCUTANEOUS

## 2023-10-30 MED ORDER — VANCOMYCIN HCL 125 MG PO CAPS
125.0000 mg | ORAL_CAPSULE | Freq: Four times a day (QID) | ORAL | Status: DC
  Administered 2023-10-31 – 2023-11-02 (×9): 125 mg via ORAL
  Filled 2023-10-30 (×8): qty 1

## 2023-10-30 NOTE — Assessment & Plan Note (Signed)
Given fidaxomicin x1 in the ER - Continue oral vancomycin

## 2023-10-30 NOTE — ED Notes (Signed)
ED TO INPATIENT HANDOFF REPORT  ED Nurse Name and Phone #: Johnney Killian Name/Age/Gender Amy Moses 52 y.o. female Room/Bed: APA09/APA09  Code Status   Code Status: Prior  Home/SNF/Other Home Patient oriented to: self, place, time, and situation Is this baseline? Yes   Triage Complete: Triage complete  Chief Complaint Clostridioides difficile diarrhea [A04.72]  Triage Note Pt sent a stool for C-diff after having diarrhea.  Pt feeling fatigue.  Pt with diarrhea after every time she ate.  Pt is hemodialysis, no urine production. Last dialysis Saturday.    Allergies Allergies  Allergen Reactions   Ace Inhibitors Cough    Level of Care/Admitting Diagnosis ED Disposition     ED Disposition  Admit   Condition  --   Comment  Hospital Area: [facility] [[id number]]  Level of Care: Med-Surg [16]  Covid Evaluation: Asymptomatic - no recent exposure (last 10 days) testing not required  Diagnosis: Clostridioides difficile diarrhea [[id number]]  Admitting Physician: Onnie Boer [[id number]]  Attending Physician: Onnie Boer [email or web address]          B Medical/Surgery History Past Medical History:  Diagnosis Date   Anemia    Blindness of right eye with low vision in contralateral eye    s/p victrectomy   Chronic diastolic heart failure (HCC) 03/30/2021   Diabetes mellitus, type II (HCC)    Dyslipidemia    Glaucoma    History of blood transfusion    Hypertension    Hypothyroidism (acquired)    Kidney disease    Stage 5   Mitral regurgitation 11/16/2022   Pneumonia    Pulmonary hypertension, unspecified (HCC) 09/12/2022   Past Surgical History:  Procedure Laterality Date   ABDOMINAL AORTOGRAM W/LOWER EXTREMITY Bilateral 12/18/2020   Procedure: ABDOMINAL AORTOGRAM W/LOWER EXTREMITY;  Surgeon: Sherren Kerns, MD;  [facility]: [facility];  Service: Cardiovascular;  Laterality: Bilateral;   ABDOMINAL AORTOGRAM W/LOWER EXTREMITY Bilateral 01/25/2022    Procedure: ABDOMINAL AORTOGRAM W/LOWER EXTREMITY;  Surgeon: Nada Libman, MD;  Location: [facility];  Service: Cardiovascular;  Laterality: Bilateral;   ABDOMINAL AORTOGRAM W/LOWER EXTREMITY Right 08/04/2023   Procedure: ABDOMINAL AORTOGRAM W/LOWER EXTREMITY;  Surgeon: Leonie Douglas, MD;  Location: [facility];  Service: Cardiovascular;  Laterality: Right;   AMPUTATION Right 02/16/2022   Procedure: RIGHT FOOT 5TH RAY AMPUTATION;  Surgeon: Nadara Mustard, MD;  Location: [facility] OR;  Service: Orthopedics;  Laterality: Right;   ANKLE FRACTURE SURGERY Right    AV FISTULA PLACEMENT Left 08/18/2020   Procedure: LEFT ARM BRACHIOCEPHALIC ARTERIOVENOUS (AV) FISTULA CREATION;  Surgeon: Sherren Kerns, MD;  Location: [facility] OR;  Service: Vascular;  Laterality: Left;   BIOPSY  04/24/2021   Procedure: BIOPSY;  Surgeon: Lanelle Bal, DO;  [facility];  Service: Endoscopy;;   CESAREAN SECTION     CHOLECYSTECTOMY     COLONOSCOPY  04/24/2021   Surgeon: Lanelle Bal, DO;  nonbleeding internal hemorrhoids, 1 large (25 mm) pedunculated transverse colon polyp (prolapse type polyp) with adherent clot and stigmata of recent bleed.   COLONOSCOPY WITH PROPOFOL N/A 05/14/2021   Procedure: COLONOSCOPY WITH PROPOFOL;  Surgeon: Corbin Ade, MD;  [facility];  Service: Endoscopy;  Laterality: N/A;   COLONOSCOPY WITH PROPOFOL N/A 03/17/2023   Procedure: COLONOSCOPY WITH PROPOFOL;  Surgeon: Corbin Ade, MD;  [facility];  Service: Endoscopy;  Laterality: N/A;   ESOPHAGOGASTRODUODENOSCOPY (EGD) WITH PROPOFOL N/A 04/24/2021  Surgeon: Lanelle Bal, DO;  duodenal erosions and gastritis biopsied (pathology with peptic duodenitis, reactive gastropathy with erosions/chronic inflammation, negative for H. pylori)   EYE SURGERY     Vatrectomy   HEMOSTASIS CLIP PLACEMENT  05/14/2021   Procedure: HEMOSTASIS CLIP PLACEMENT;  Surgeon: Corbin Ade, MD;   [facility];  Service: Endoscopy;;   IR PERC TUN PERIT CATH WO PORT S&I /IMAG  09/15/2020   IR REMOVAL TUN CV CATH W/O FL  02/19/2021   IR US GUIDE VASC ACCESS RIGHT  09/15/2020   POLYPECTOMY  04/24/2021   Procedure: POLYPECTOMY;  Surgeon: Lanelle Bal, DO;  [facility];  Service: Endoscopy;;   POLYPECTOMY  05/14/2021   Procedure: POLYPECTOMY;  Surgeon: Corbin Ade, MD;  [facility];  Service: Endoscopy;;   POLYPECTOMY  03/17/2023   Procedure: POLYPECTOMY;  Surgeon: Corbin Ade, MD;  [facility];  Service: Endoscopy;;   SKIN SPLIT GRAFT Bilateral 09/03/2021   Procedure: SKIN GRAFT BILATERAL LEGS;  Surgeon: Nadara Mustard, MD;  Location: [facility] OR;  Service: Orthopedics;  Laterality: Bilateral;   SKIN SPLIT GRAFT Left 12/10/2021   Procedure: IRRIGATION AND DEBRIDEMENT LEFT CALF, APPLICATION SPLIT THICKNESS SKIN GRAFT;  Surgeon: Nadara Mustard, MD;  Location: [facility] OR;  Service: Orthopedics;  Laterality: Left;   TOE SURGERY       A IV Location/Drains/Wounds Patient Lines/Drains/Airways Status     Active Line/Drains/Airways     Name Placement date Placement time Site Days   Fistula / Graft Left Upper arm Arteriovenous fistula --  --  Upper arm  --   Wound / Incision (Open or Dehisced) 08/04/23 Venous stasis ulcer Foot Right;Posterior pink 08/04/23  1015  Foot  87            Intake/Output Last 24 hours No intake or output data in the 24 hours ending 10/30/23 2022  Labs/Imaging Results for orders placed or performed during the hospital encounter of 10/30/23 (from the past 48 hour(s))  CBC with Differential     Status: Abnormal   Collection Time: 10/30/23  5:24 PM  Result Value Ref Range   WBC 6.3 4.0 - 10.5 K/uL   RBC 3.89 3.87 - 5.11 MIL/uL   Hemoglobin 11.5 (L) 12.0 - 15.0 g/dL   HCT 08.6 57.8 - 46.9 %   MCV 94.1 80.0 - 100.0 fL   MCH 29.6 26.0 - 34.0 pg   MCHC 31.4 30.0 - 36.0 g/dL   RDW 62.9 52.8 - 41.3 %   Platelets  72 (L) 150 - 400 K/uL    Comment: SPECIMEN CHECKED FOR CLOTS Immature Platelet Fraction may be clinically indicated, consider ordering this additional test [id number] REPEATED TO VERIFY    nRBC 0.0 0.0 - 0.2 %   Neutrophils Relative % 71 %   Neutro Abs 4.5 1.7 - 7.7 K/uL   Lymphocytes Relative 18 %   Lymphs Abs 1.1 0.7 - 4.0 K/uL   Monocytes Relative 8 %   Monocytes Absolute 0.5 0.1 - 1.0 K/uL   Eosinophils Relative 2 %   Eosinophils Absolute 0.2 0.0 - 0.5 K/uL   Basophils Relative 1 %   Basophils Absolute 0.0 0.0 - 0.1 K/uL   WBC Morphology MORPHOLOGY UNREMARKABLE    RBC Morphology MORPHOLOGY UNREMARKABLE    Immature Granulocytes 0 %   Abs Immature Granulocytes 0.01 0.00 - 0.07 K/uL    Comment: Performed at [facility], [address], [place], [place]  [address]  Comprehensive metabolic panel     Status: Abnormal   Collection Time: 10/30/23  5:24 PM  Result Value Ref Range   Sodium 136 135 - 145 mmol/L   Potassium 4.2 3.5 - 5.1 mmol/L   Chloride 100 98 - 111 mmol/L   CO2 21 (L) 22 - 32 mmol/L   Glucose, Bld 154 (H) 70 - 99 mg/dL    Comment: Glucose reference range applies only to samples taken after fasting for at least 8 hours.   BUN 50 (H) 6 - 20 mg/dL   Creatinine, Ser 6.04 (H) 0.44 - 1.00 mg/dL   Calcium 9.2 8.9 - 54.0 mg/dL   Total Protein 7.1 6.5 - 8.1 g/dL   Albumin 4.1 3.5 - 5.0 g/dL   AST 19 15 - 41 U/L   ALT 22 0 - 44 U/L   Alkaline Phosphatase 126 38 - 126 U/L   Total Bilirubin 0.7 <1.2 mg/dL   GFR, Estimated 6 (L) >60 mL/min    Comment: (NOTE) Calculated using the CKD-EPI Creatinine Equation (2021)    Anion gap 15 5 - 15    Comment: Performed at [facility], [address], [place], [place] [address]   No results found.  Pending Labs Unresulted Labs (From admission, onward)    None       Vitals/Pain Today's Vitals   10/30/23 1602 10/30/23 1602 10/30/23 1939 10/30/23 1940  BP:  129/75 (!) 101/42   Pulse:  71 70 67  Resp:  16 18   Temp:  97.9  F (36.6 C)    TempSrc:  Oral    SpO2:  97% 97% 96%  Weight: 93 kg     Height: 5\' 6"  (1.676 m)     PainSc:        Isolation Precautions No active isolations  Medications Medications  fidaxomicin (DIFICID) tablet 200 mg (has no administration in time range)    Mobility walks     Focused Assessments     R Recommendations: See Admitting Provider Note  Report given to:   Additional Notes: C-Diff+

## 2023-10-30 NOTE — Assessment & Plan Note (Addendum)
Schedule Tuesday Thursday Saturday.  Has missed dialysis sessions over the past few weeks due to diarrheal illness, but there was able to go for her last 2 dialysis sessions.  Last HD was 2 days ago on Saturday. -Gentle hydration -Please consult nephrology in a.m. for HD -Resume renal meds

## 2023-10-30 NOTE — ED Provider Notes (Signed)
[place] EMERGENCY DEPARTMENT AT [facility] Provider Note   CSN: [id number] Arrival date & time: 10/30/23  1525     History  Chief Complaint  Patient presents with   Diarrhea    Amy Moses is a 52 y.o. female.   Diarrhea Associated symptoms: abdominal pain   Presents for diarrhea.  Medical history includes DM, HLD, HTN, anemia, ESRD, GERD, OSA, PAD.  She typically undergoes dialysis on T, TH, SA.  She has had ongoing diarrhea for the past 3 weeks.  Because of the diarrhea, she typically misses at least 1 session of dialysis per week.  This past week, she did miss Tuesday but was able to go on Thursday and Saturday.  In addition to the diarrhea, she has had fatigue, generalized weakness, and intermittent abdominal pain.  She has multiple episodes of watery diarrhea per day, typically anytime she tries to eat or drink anything.  She recently tested positive for C. difficile.  She is anuric.     Home Medications Prior to Admission medications   Medication Sig Start Date End Date Taking? Authorizing Provider  Prednisol Ace-Moxiflox-Bromfen 1-0.5-0.075 % SUSP Apply 1 drop to eye 3 (three) times daily. 09/21/23  Yes [provider]  acetaminophen (TYLENOL) 500 MG tablet Take 1,000 mg by mouth every 6 (six) hours as needed for moderate pain.    [provider]  atorvastatin (LIPITOR) 10 MG tablet Take 10 mg by mouth every evening.    [provider]  AURYXIA 1 GM 210 MG(Fe) tablet Take 630 mg by mouth 3 (three) times daily with meals. 01/07/22   [provider]  Blood Glucose Monitoring Suppl (ACCU-CHEK GUIDE ME) w/Device KIT 1 Piece by Does not apply route as directed. 03/16/22   Roma Kayser, MD  Blood Pressure Monitor KIT TAKE BLOOD PRESSURE DAILY 09/15/22   Chilton Si, MD  Continuous Blood Gluc Receiver (FREESTYLE LIBRE 2 READER) DEVI As directed 04/06/22   Roma Kayser, MD  Continuous Blood Gluc Sensor  (FREESTYLE LIBRE 2 SENSOR) MISC 1 Piece by Does not apply route every 14 (fourteen) days. 04/06/22   Roma Kayser, MD  diclofenac (VOLTAREN) 0.1 % ophthalmic solution Place 1 drop into both eyes daily. 09/22/23   [provider]  diphenoxylate-atropine (LOMOTIL) 2.5-0.025 MG tablet Take 2 tablets by mouth 4 (four) times daily as needed for diarrhea or loose stools.    [provider]  glucose blood (ACCU-CHEK GUIDE) test strip Use as instructed 03/16/22   Roma Kayser, MD  HUMALOG KWIKPEN 100 UNIT/ML KwikPen Inject 5-11 Units into the skin 3 (three) times daily with meals. If eats 50% or more of meal. Patient taking differently: Inject 5-11 Units into the skin 3 (three) times daily with meals. If eats 50% or more of meal. Sliding scale 03/16/22   Nida, Denman George, MD  hydrocortisone (ANUSOL-HC) 2.5 % rectal cream Place 1 Application rectally 2 (two) times daily. 10/24/23   Gelene Mink, NP  insulin glargine (LANTUS) 100 UNIT/ML injection Inject 0.2 mLs (20 Units total) into the skin at bedtime. Patient taking differently: Inject 8 Units into the skin at bedtime. 04/06/22   Roma Kayser, MD  levothyroxine (SYNTHROID) 75 MCG tablet Take 75 mcg by mouth daily. 06/13/22   [provider]  midodrine (PROAMATINE) 10 MG tablet Take 10 mg by mouth daily. 09/27/22   [provider]  pantoprazole (PROTONIX) 40 MG tablet Take 1 tablet (40 mg total) by mouth 2 (  two) times daily. 04/25/21   Shon Hale, MD  polyethylene glycol powder (MIRALAX) 17 GM/SCOOP powder Take 17 g by mouth daily. Patient taking differently: Take 17 g by mouth daily as needed for mild constipation. 03/17/23   Narda Bonds, MD  prednisoLONE acetate (PRED FORTE) 1 % ophthalmic suspension Place 1 drop into both eyes 3 (three) times daily. 09/21/23   [provider]  tobramycin (TOBREX) 0.3 % ophthalmic solution Place 1 drop into both eyes 3 (three) times daily. 09/21/23    [provider]  Vitamin D, Ergocalciferol, (DRISDOL) 1.25 MG (50000 UNIT) CAPS capsule Take 50,000 Units by mouth every 7 (seven) days. Friday 04/20/21   [provider]      Allergies    Ace inhibitors    Review of Systems   Review of Systems  Constitutional:  Positive for fatigue.  Gastrointestinal:  Positive for abdominal pain and diarrhea.  Neurological:  Positive for weakness (Generalized).  All other systems reviewed and are negative.   Physical Exam Updated Vital Signs BP (!) 101/42   Pulse 67   Temp 97.9 F (36.6 C) (Oral)   Resp 18   Ht 5\' 6"  (1.676 m)   Wt 93 kg   LMP 08/22/2015 (Approximate)   SpO2 96%   BMI 33.09 kg/m  Physical Exam Vitals and nursing note reviewed.  Constitutional:      General: She is not in acute distress.    Appearance: Normal appearance. She is well-developed. She is not ill-appearing, toxic-appearing or diaphoretic.  HENT:     Head: Normocephalic and atraumatic.     Right Ear: External ear normal.     Left Ear: External ear normal.     Nose: Nose normal.     Mouth/Throat:     Mouth: Mucous membranes are moist.  Eyes:     Extraocular Movements: Extraocular movements intact.     Conjunctiva/sclera: Conjunctivae normal.  Cardiovascular:     Rate and Rhythm: Normal rate and regular rhythm.  Pulmonary:     Effort: Pulmonary effort is normal. No respiratory distress.  Abdominal:     General: There is no distension.     Palpations: Abdomen is soft.     Tenderness: There is abdominal tenderness. There is no guarding or rebound.  Musculoskeletal:        General: No swelling. Normal range of motion.     Cervical back: Normal range of motion and neck supple.  Skin:    General: Skin is warm and dry.     Coloration: Skin is not jaundiced or pale.  Neurological:     General: No focal deficit present.     Mental Status: She is alert and oriented to person, place, and time.     Cranial Nerves: No cranial nerve deficit.      Sensory: No sensory deficit.     Motor: No weakness.     Coordination: Coordination normal.  Psychiatric:        Mood and Affect: Mood normal.        Behavior: Behavior normal.     ED Results / Procedures / Treatments   Labs (all labs ordered are listed, but only abnormal results are displayed) Labs Reviewed  CBC WITH DIFFERENTIAL/PLATELET - Abnormal; Notable for the following components:      Result Value   Hemoglobin 11.5 (*)    Platelets 72 (*)    All other components within normal limits  COMPREHENSIVE METABOLIC PANEL - Abnormal; Notable for the following  components:   CO2 21 (*)    Glucose, Bld 154 (*)    BUN 50 (*)    Creatinine, Ser 7.92 (*)    GFR, Estimated 6 (*)    All other components within normal limits  HIV ANTIBODY (ROUTINE TESTING W REFLEX)  BASIC METABOLIC PANEL  CBC    EKG None  Radiology No results found.  Procedures Procedures    Medications Ordered in ED Medications  0.9 %  sodium chloride infusion (has no administration in time range)  vancomycin (VANCOCIN) capsule 125 mg (has no administration in time range)  levothyroxine (SYNTHROID) tablet 75 mcg (has no administration in time range)  heparin injection 5,000 Units (has no administration in time range)  acetaminophen (TYLENOL) tablet 650 mg (has no administration in time range)    Or  acetaminophen (TYLENOL) suppository 650 mg (has no administration in time range)  ondansetron (ZOFRAN) tablet 4 mg (has no administration in time range)    Or  ondansetron (ZOFRAN) injection 4 mg (has no administration in time range)  valACYclovir (VALTREX) tablet 500 mg (has no administration in time range)  insulin aspart (novoLOG) injection 0-9 Units (has no administration in time range)  insulin aspart (novoLOG) injection 0-5 Units (has no administration in time range)    ED Course/ Medical Decision Making/ A&P                                 Medical Decision Making Risk Decision regarding  hospitalization.   This patient presents to the ED for concern of diarrhea, this involves an extensive number of treatment options, and is a complaint that carries with it a high risk of complications and morbidity.  The differential diagnosis includes C. difficile, other colitis, malabsorption, dehydration, metabolic derangements   Co morbidities that complicate the patient evaluation  DM, HLD, HTN, anemia, ESRD, GERD, OSA, PAD   Additional history obtained:  Additional history obtained from N/A External records from outside source obtained and reviewed including EMR   Lab Tests:  I Ordered, and personally interpreted labs.  The pertinent results include: Elevated creatinine and BUN consistent with ESRD; normal electrolytes, no leukocytosis, baseline anemia   Cardiac Monitoring: / EKG:  The patient was maintained on a cardiac monitor.  I personally viewed and interpreted the cardiac monitored which showed an underlying rhythm of: Sinus rhythm   Problem List / ED Course / Critical interventions / Medication management  Patient presenting for ongoing persistent diarrhea for the past 3 weeks, recently tested positive for C. difficile.  She is wheelchair-bound and has missed multiple sessions of diarrhea due to the diarrhea.  On arrival in the ED, vital signs are normal.  Patient is overall well-appearing on exam.  Her abdomen is soft.  She does endorse some mild generalized tenderness.  Lab work was unremarkable.  Given her multiple comorbidities, fidaxomicin was ordered to minimize chance of recurrence.  Patient was admitted for further management. I ordered medication including fidaxomicin  for C. difficile Reevaluation of the patient after these medicines showed that the patient stayed the same I have reviewed the patients home medicines and have made adjustments as needed   Social Determinants of Health:  Has PCP        Final Clinical Impression(s) / ED Diagnoses Final  diagnoses:  C. difficile diarrhea    Rx / DC Orders ED Discharge Orders     None  Gloris Manchester, MD 10/30/23 [phone]

## 2023-10-30 NOTE — ED Triage Notes (Signed)
Pt sent a stool for C-diff after having diarrhea.  Pt feeling fatigue.  Pt with diarrhea after every time she ate.  Pt is hemodialysis, no urine production. Last dialysis Saturday.

## 2023-10-30 NOTE — ED Provider Triage Note (Addendum)
Emergency Medicine Provider Triage Evaluation Note  Amy Moses , a 52 y.o. female  was evaluated in triage.  Pt complains of diarrhea.  Review of Systems  Positive: C.diff per her PCP, vomiting Negative: Fever,   Physical Exam  LMP 08/22/2015 (Approximate)  Gen:   Awake, no distress   Resp:  Normal effort  MSK:   Moves extremities without difficulty  Other:    Medical Decision Making  Medically screening exam initiated at 4:01 PM.  Appropriate orders placed.  Amy Moses was informed that the remainder of the evaluation will be completed by another provider, this initial triage assessment does not replace that evaluation, and the importance of remaining in the ED until their evaluation is complete.  Diarrhea for 3 weeks - tested positive for C. Diff at PCP's office. Dialysis patient, so is fluids restricted and also has missed treatments due to diarrheal illness. Progressively weaker over the last 4-5 days.   C.diff + test reviewed in chart.     Amy Anis, PA-C 10/30/23 1603    Amy Anis, PA-C 10/30/23 1604

## 2023-10-30 NOTE — Assessment & Plan Note (Signed)
Appears euvolemic to dry 

## 2023-10-30 NOTE — Assessment & Plan Note (Signed)
Resume Synthroid ?

## 2023-10-30 NOTE — Assessment & Plan Note (Signed)
-   SSI- S

## 2023-10-30 NOTE — Assessment & Plan Note (Addendum)
Rash to back, papular, rash appears mostly generalized, but worse in the mid back, not exactly dermatomal pattern.  Reports pain, and itching from rash.  She has history of  shingles- anterior chest for which she has required Valtrex in the past. -Valtrex for now

## 2023-10-30 NOTE — H&P (Signed)
History and Physical    Amy Moses ZOX:[id number] DOB: [birth year] DOA: 10/30/2023  PCP: Lorelei Pont, DO   Patient coming from: Home  I have personally briefly reviewed patient's old medical records in [name] Link  Chief Complaint: Diarrhea  HPI: Amy Moses is a 52 y.o. female with medical history significant for ESRD, diastolic CHF, blind in right eye, hypertension. Patient presented to the ED with complaints of diarrheal stools daily over the past 3 weeks.  She reports multiple episodes daily, what ever she eats or drinks just runs to her.  She reports some intermittent abdominal pain but she has not had any in the past 2 days.  No fevers no chills.  No vomiting. She is on dialysis Tuesday Thursday Saturday, but has had to miss HD sessions here in the due to diarrheal illness, she went for her last 2 HD sessions.  Denies dizziness but reports generalized weakness.  Difficulty breathing no chest pain.  She ambulates with a wheelchair.  She is anuric. Patient send has to for evaluation, and she was told that her stool was positive for C. difficile.  ED Course: Stable vitals.  WBC 6.3.  Due to concern for dehydration, persistent diarrhea, hospitalist to admit.  Fidaxomicin given in ED.   Review of Systems: As per HPI all other systems reviewed and negative.  Past Medical History:  Diagnosis Date   Anemia    Blindness of right eye with low vision in contralateral eye    s/p victrectomy   Chronic diastolic heart failure (HCC) 03/30/2021   Diabetes mellitus, type II (HCC)    Dyslipidemia    Glaucoma    History of blood transfusion    Hypertension    Hypothyroidism (acquired)    Kidney disease    Stage 5   Mitral regurgitation 11/16/2022   Pneumonia    Pulmonary hypertension, unspecified (HCC) 09/12/2022    Past Surgical History:  Procedure Laterality Date   ABDOMINAL AORTOGRAM W/LOWER EXTREMITY Bilateral 12/18/2020   Procedure: ABDOMINAL AORTOGRAM W/LOWER  EXTREMITY;  Surgeon: Sherren Kerns, MD;  [facility];  Service: Cardiovascular;  Laterality: Bilateral;   ABDOMINAL AORTOGRAM W/LOWER EXTREMITY Bilateral 01/25/2022   Procedure: ABDOMINAL AORTOGRAM W/LOWER EXTREMITY;  Surgeon: Nada Libman, MD;  Location: [facility];  Service: Cardiovascular;  Laterality: Bilateral;   ABDOMINAL AORTOGRAM W/LOWER EXTREMITY Right 08/04/2023   Procedure: ABDOMINAL AORTOGRAM W/LOWER EXTREMITY;  Surgeon: Leonie Douglas, MD;  [facility]: [facility];  Service: Cardiovascular;  Laterality: Right;   AMPUTATION Right 02/16/2022   Procedure: RIGHT FOOT 5TH RAY AMPUTATION;  Surgeon: Nadara Mustard, MD;  Location: [facility] OR;  Service: Orthopedics;  Laterality: Right;   ANKLE FRACTURE SURGERY Right    AV FISTULA PLACEMENT Left 08/18/2020   Procedure: LEFT ARM BRACHIOCEPHALIC ARTERIOVENOUS (AV) FISTULA CREATION;  Surgeon: Sherren Kerns, MD;  Location: [name] OR;  Service: Vascular;  Laterality: Left;   BIOPSY  04/24/2021   Procedure: BIOPSY;  Surgeon: Lanelle Bal, DO;  [facility];  Service: Endoscopy;;   CESAREAN SECTION     CHOLECYSTECTOMY     COLONOSCOPY  04/24/2021   Surgeon: Lanelle Bal, DO;  nonbleeding internal hemorrhoids, 1 large (25 mm) pedunculated transverse colon polyp (prolapse type polyp) with adherent clot and stigmata of recent bleed.   COLONOSCOPY WITH PROPOFOL N/A 05/14/2021   Procedure: COLONOSCOPY WITH PROPOFOL;  Surgeon: Corbin Ade, MD;  [facility];  Service: Endoscopy;  Laterality:  N/A;   COLONOSCOPY WITH PROPOFOL N/A 03/17/2023   Procedure: COLONOSCOPY WITH PROPOFOL;  Surgeon: Corbin Ade, MD;  [facility];  Service: Endoscopy;  Laterality: N/A;   ESOPHAGOGASTRODUODENOSCOPY (EGD) WITH PROPOFOL N/A 04/24/2021   Surgeon: Lanelle Bal, DO;  duodenal erosions and gastritis biopsied (pathology with peptic duodenitis, reactive gastropathy with erosions/chronic  inflammation, negative for H. pylori)   EYE SURGERY     Vatrectomy   HEMOSTASIS CLIP PLACEMENT  05/14/2021   Procedure: HEMOSTASIS CLIP PLACEMENT;  Surgeon: Corbin Ade, MD;  [facility];  Service: Endoscopy;;   IR PERC TUN PERIT CATH WO PORT S&I /IMAG  09/15/2020   IR REMOVAL TUN CV CATH W/O FL  02/19/2021   IR US GUIDE VASC ACCESS RIGHT  09/15/2020   POLYPECTOMY  04/24/2021   Procedure: POLYPECTOMY;  Surgeon: Lanelle Bal, DO;  [facility];  Service: Endoscopy;;   POLYPECTOMY  05/14/2021   Procedure: POLYPECTOMY;  Surgeon: Corbin Ade, MD;  [facility];  Service: Endoscopy;;   POLYPECTOMY  03/17/2023   Procedure: POLYPECTOMY;  Surgeon: Corbin Ade, MD;  [facility];  Service: Endoscopy;;   SKIN SPLIT GRAFT Bilateral 09/03/2021   Procedure: SKIN GRAFT BILATERAL LEGS;  Surgeon: Nadara Mustard, MD;  Location: [facility] OR;  Service: Orthopedics;  Laterality: Bilateral;   SKIN SPLIT GRAFT Left 12/10/2021   Procedure: IRRIGATION AND DEBRIDEMENT LEFT CALF, APPLICATION SPLIT THICKNESS SKIN GRAFT;  Surgeon: Nadara Mustard, MD;  Location: [facility] OR;  Service: Orthopedics;  Laterality: Left;   TOE SURGERY       reports that she has never smoked. She has never used smokeless tobacco. She reports that she does not drink alcohol and does not use drugs.  Allergies  Allergen Reactions   Ace Inhibitors Cough    Family History  Problem Relation Age of Onset   Heart failure Mother    Heart disease Mother    Diabetes Mother    Kidney disease Mother    Heart failure Father    Diabetes Father    Heart disease Father    Diabetes Brother    Heart failure Maternal Grandmother    Heart failure Maternal Grandfather    Transient ischemic attack Maternal Grandfather    Colon cancer Neg Hx    Prior to Admission medications   Medication Sig Start Date End Date Taking? Authorizing Provider  Prednisol Ace-Moxiflox-Bromfen 1-0.5-0.075 % SUSP Apply 1  drop to eye 3 (three) times daily. 09/21/23  Yes [provider]  acetaminophen (TYLENOL) 500 MG tablet Take 1,000 mg by mouth every 6 (six) hours as needed for moderate pain.    [provider]  atorvastatin (LIPITOR) 10 MG tablet Take 10 mg by mouth every evening.    [provider]  AURYXIA 1 GM 210 MG(Fe) tablet Take 630 mg by mouth 3 (three) times daily with meals. 01/07/22   [provider]  Blood Glucose Monitoring Suppl (ACCU-CHEK GUIDE ME) w/Device KIT 1 Piece by Does not apply route as directed. 03/16/22   Roma Kayser, MD  Blood Pressure Monitor KIT TAKE BLOOD PRESSURE DAILY 09/15/22   Chilton Si, MD  Continuous Blood Gluc Receiver (FREESTYLE LIBRE 2 READER) DEVI As directed 04/06/22   Roma Kayser, MD  Continuous Blood Gluc Sensor (FREESTYLE LIBRE 2 SENSOR) MISC 1 Piece by Does not apply route every 14 (fourteen) days. 04/06/22   Roma Kayser, MD  diclofenac (VOLTAREN)  0.1 % ophthalmic solution Place 1 drop into both eyes daily. 09/22/23   [provider]  diphenoxylate-atropine (LOMOTIL) 2.5-0.025 MG tablet Take 2 tablets by mouth 4 (four) times daily as needed for diarrhea or loose stools.    [provider]  glucose blood (ACCU-CHEK GUIDE) test strip Use as instructed 03/16/22   Roma Kayser, MD  HUMALOG KWIKPEN 100 UNIT/ML KwikPen Inject 5-11 Units into the skin 3 (three) times daily with meals. If eats 50% or more of meal. Patient taking differently: Inject 5-11 Units into the skin 3 (three) times daily with meals. If eats 50% or more of meal. Sliding scale 03/16/22   Nida, Denman George, MD  hydrocortisone (ANUSOL-HC) 2.5 % rectal cream Place 1 Application rectally 2 (two) times daily. 10/24/23   Gelene Mink, NP  insulin glargine (LANTUS) 100 UNIT/ML injection Inject 0.2 mLs (20 Units total) into the skin at bedtime. Patient taking differently: Inject 8 Units into the skin at bedtime. 04/06/22    Roma Kayser, MD  levothyroxine (SYNTHROID) 75 MCG tablet Take 75 mcg by mouth daily. 06/13/22   [provider]  midodrine (PROAMATINE) 10 MG tablet Take 10 mg by mouth daily. 09/27/22   [provider]  pantoprazole (PROTONIX) 40 MG tablet Take 1 tablet (40 mg total) by mouth 2 (two) times daily. 04/25/21   Shon Hale, MD  polyethylene glycol powder (MIRALAX) 17 GM/SCOOP powder Take 17 g by mouth daily. 03/17/23   Narda Bonds, MD  prednisoLONE acetate (PRED FORTE) 1 % ophthalmic suspension Place 1 drop into both eyes 3 (three) times daily. 09/21/23   [provider]  tobramycin (TOBREX) 0.3 % ophthalmic solution Place 1 drop into both eyes 3 (three) times daily. 09/21/23   [provider]  Vitamin D, Ergocalciferol, (DRISDOL) 1.25 MG (50000 UNIT) CAPS capsule Take 50,000 Units by mouth every 7 (seven) days. Friday 04/20/21   [provider]    Physical Exam: Vitals:   10/30/23 1602 10/30/23 1602 10/30/23 1939 10/30/23 1940  BP:  129/75 (!) 101/42   Pulse:  71 70 67  Resp:  16 18   Temp:  97.9 F (36.6 C)    TempSrc:  Oral    SpO2:  97% 97% 96%  Weight: 93 kg     Height: 5\' 6"  (1.676 m)       Constitutional: NAD, calm, comfortable Vitals:   10/30/23 1602 10/30/23 1602 10/30/23 1939 10/30/23 1940  BP:  129/75 (!) 101/42   Pulse:  71 70 67  Resp:  16 18   Temp:  97.9 F (36.6 C)    TempSrc:  Oral    SpO2:  97% 97% 96%  Weight: 93 kg     Height: 5\' 6"  (1.676 m)      Eyes:  Blind right eye. ENMT: Mucous membranes are moist.   Neck: normal, supple, no masses, no thyromegaly Respiratory: clear to auscultation bilaterally, no wheezing, no crackles. Normal respiratory effort. No accessory muscle use.  Cardiovascular: Regular rate and rhythm, no murmurs / rubs / gallops. No extremity edema. 2+ pedal pulses. No carotid bruits.  Abdomen: no tenderness, no masses palpated. No hepatosplenomegaly. Bowel sounds positive.   Musculoskeletal: no clubbing / cyanosis. No joint deformity upper and lower extremities.  Skin: Itchy and painful papula hyperpigmented rash to back, diffusely, but worse in mid back bilaterally,  Neurologic: No facial symmetry, speech fluent.  No new deficits. Psychiatric: Normal judgment and insight. Alert and oriented x  3. Normal mood.   Labs on Admission: I have personally reviewed following labs and imaging studies  CBC: Recent Labs  Lab 10/30/23 1724  WBC 6.3  NEUTROABS 4.5  HGB 11.5*  HCT 36.6  MCV 94.1  PLT 72*   Basic Metabolic Panel: Recent Labs  Lab 10/30/23 1724  NA 136  K 4.2  CL 100  CO2 21*  GLUCOSE 154*  BUN 50*  CREATININE 7.92*  CALCIUM 9.2   GFR: Estimated Creatinine Clearance: 9.8 mL/min (A) (by C-G formula based on SCr of 7.92 mg/dL (H)). Liver Function Tests: Recent Labs  Lab 10/30/23 1724  AST 19  ALT 22  ALKPHOS 126  BILITOT 0.7  PROT 7.1  ALBUMIN 4.1   Radiological Exams on Admission: No results found.  EKG: None  Assessment/Plan Principal Problem:   Clostridioides difficile diarrhea Active Problems:   Rash   Type 2 diabetes mellitus with ESRD (end-stage renal disease) (HCC)   Acquired hypothyroidism   ESRD (end stage renal disease) on dialysis (HCC)   Chronic diastolic heart failure (HCC)   OSA (obstructive sleep apnea)   Assessment and Plan: * Clostridioides difficile diarrhea Daily diarrheal stools over the past 3 weeks.  Afebrile without leukocytosis.  Reports some intermittent abdominal pain but none over the past 2 days.  Abdominal exam is benign. -Stool C. difficile positive, toxin is positive. -Gentle hydration, N/s 50cc/hr x 10hrs -Fidaxomicin given in ED, will continue with oral vancomycin  Rash Rash to back, papular, rash appears mostly generalized, but worse in the mid back, not exactly dermatomal pattern.  Reports pain, and itching from rash.  She has history of  shingles- anterior chest for which she has  required Valtrex in the past. -Valtrex for now   Chronic diastolic heart failure (HCC) Stable and compensated.  Appears dehydrated from 3 weeks of diarrhea.  Last echo 07/2022 EF of 50 to 55%.  ESRD (end stage renal disease) on dialysis [name]) Schedule Tuesday Thursday Saturday.  Has missed dialysis sessions over the past few weeks due to diarrheal illness, but there was able to go for her last 2 dialysis sessions.  Last HD was 2 days ago on Saturday. -Gentle hydration -Please consult nephrology in a.m. for HD -Resume renal meds  Acquired hypothyroidism Resume Synthroid  Type 2 diabetes mellitus with ESRD (end-stage renal disease) (HCC) - SSI- S    DVT prophylaxis: Heparin Code Status: FULL code Family Communication:  Daughter Shauntea at bedside Disposition Plan: ~ 2 days Consults called: Please consult nephrology in a.m. for hemodialysis Admission status: Obs Med surg    Author: Onnie Boer, MD 10/30/2023 10:34 PM  For on call review [email or web address].

## 2023-10-31 ENCOUNTER — Telehealth (HOSPITAL_COMMUNITY): Payer: Self-pay | Admitting: Pharmacy Technician

## 2023-10-31 ENCOUNTER — Other Ambulatory Visit (HOSPITAL_COMMUNITY): Payer: Self-pay

## 2023-10-31 DIAGNOSIS — E039 Hypothyroidism, unspecified: Secondary | ICD-10-CM

## 2023-10-31 DIAGNOSIS — R21 Rash and other nonspecific skin eruption: Secondary | ICD-10-CM | POA: Diagnosis not present

## 2023-10-31 DIAGNOSIS — G4733 Obstructive sleep apnea (adult) (pediatric): Secondary | ICD-10-CM

## 2023-10-31 DIAGNOSIS — I5032 Chronic diastolic (congestive) heart failure: Secondary | ICD-10-CM | POA: Diagnosis not present

## 2023-10-31 DIAGNOSIS — A0472 Enterocolitis due to Clostridium difficile, not specified as recurrent: Secondary | ICD-10-CM | POA: Diagnosis not present

## 2023-10-31 LAB — BASIC METABOLIC PANEL
Anion gap: 13 (ref 5–15)
BUN: 56 mg/dL — ABNORMAL HIGH (ref 6–20)
CO2: 19 mmol/L — ABNORMAL LOW (ref 22–32)
Calcium: 9 mg/dL (ref 8.9–10.3)
Chloride: 105 mmol/L (ref 98–111)
Creatinine, Ser: 8.4 mg/dL — ABNORMAL HIGH (ref 0.44–1.00)
GFR, Estimated: 5 mL/min — ABNORMAL LOW (ref >60)
Glucose, Bld: 216 mg/dL — ABNORMAL HIGH (ref 70–99)
Potassium: 4.3 mmol/L (ref 3.5–5.1)
Sodium: 137 mmol/L (ref 135–145)

## 2023-10-31 LAB — GLUCOSE, CAPILLARY
Glucose-Capillary: 176 mg/dL — ABNORMAL HIGH (ref 70–99)
Glucose-Capillary: 182 mg/dL — ABNORMAL HIGH (ref 70–99)
Glucose-Capillary: 170 mg/dL — ABNORMAL HIGH (ref 70–99)

## 2023-10-31 LAB — CBC
HCT: 33.1 % — ABNORMAL LOW (ref 36.0–46.0)
Hemoglobin: 10.5 g/dL — ABNORMAL LOW (ref 12.0–15.0)
MCH: 30 pg (ref 26.0–34.0)
MCHC: 31.7 g/dL (ref 30.0–36.0)
MCV: 94.6 fL (ref 80.0–100.0)
Platelets: 60 10*3/uL — ABNORMAL LOW (ref 150–400)
RBC: 3.5 MIL/uL — ABNORMAL LOW (ref 3.87–5.11)
RDW: 13.5 % (ref 11.5–15.5)
WBC: 6 10*3/uL (ref 4.0–10.5)
nRBC: 0 % (ref 0.0–0.2)

## 2023-10-31 LAB — HIV ANTIBODY (ROUTINE TESTING W REFLEX): HIV Screen 4th Generation wRfx: NONREACTIVE

## 2023-10-31 LAB — HEPATITIS B SURFACE ANTIGEN: Hepatitis B Surface Ag: NONREACTIVE

## 2023-10-31 MED ORDER — PENTAFLUOROPROP-TETRAFLUOROETH EX AERO: 1.0000 | INHALATION_SPRAY | CUTANEOUS | Status: DC | PRN

## 2023-10-31 MED ORDER — LIDOCAINE HCL (PF) 1 % IJ SOLN: 5.0000 mL | INTRAMUSCULAR | Status: DC | PRN

## 2023-10-31 MED ORDER — LIDOCAINE-PRILOCAINE 2.5-2.5 % EX CREA: 1.0000 | TOPICAL_CREAM | CUTANEOUS | Status: DC | PRN

## 2023-10-31 MED ORDER — PREDNISOLONE ACETATE 1 % OP SUSP
1.0000 [drp] | Freq: Three times a day (TID) | OPHTHALMIC | Status: DC
  Administered 2023-10-31 – 2023-11-03 (×4): 1 [drp] via OPHTHALMIC
  Filled 2023-10-31: qty 1

## 2023-10-31 MED ORDER — SIMETHICONE 40 MG/0.6ML PO SUSP
40.0000 mg | Freq: Once | ORAL | Status: DC
  Filled 2023-10-31: qty 0.6

## 2023-10-31 MED ORDER — SIMETHICONE 80 MG PO CHEW
40.0000 mg | CHEWABLE_TABLET | Freq: Once | ORAL | Status: AC
  Administered 2023-10-31: 40 mg via ORAL
  Filled 2023-10-31: qty 1

## 2023-10-31 MED ORDER — FERRIC CITRATE 1 GM 210 MG(FE) PO TABS
630.0000 mg | ORAL_TABLET | Freq: Three times a day (TID) | ORAL | Status: DC
  Administered 2023-11-01 – 2023-11-03 (×8): 630 mg via ORAL
  Filled 2023-10-31 (×17): qty 3

## 2023-10-31 MED ORDER — CHLORHEXIDINE GLUCONATE CLOTH 2 % EX PADS
6.0000 | MEDICATED_PAD | Freq: Every day | CUTANEOUS | Status: DC
  Administered 2023-10-31 – 2023-11-02 (×3): 6 via TOPICAL

## 2023-10-31 MED ORDER — TOBRAMYCIN 0.3 % OP SOLN
1.0000 [drp] | Freq: Three times a day (TID) | OPHTHALMIC | Status: DC
  Administered 2023-10-31 – 2023-11-03 (×4): 1 [drp] via OPHTHALMIC
  Filled 2023-10-31: qty 5

## 2023-10-31 NOTE — Consult Note (Signed)
WOC consulted for LE dressing change orders. Patient followed by Dr. Lajoyce Corners outpatient last seen 11/25  Discussed with Dr. Lajoyce Corners, will order patient compression socks from [employer].   Contacted vendor for sock order 15-24mmHG. Provided demographics to vendor.   Notified bedside nursing and admitting MD.   Re consult if needed, will not follow at this time. Thanks  Alvilda Mckenna [employer], RN,CWOCN, CNS, CWON-AP [phone])

## 2023-10-31 NOTE — Care Management Obs Status (Signed)
MEDICARE OBSERVATION STATUS NOTIFICATION   Patient Details  Name: Amy Moses MRN: [id number] Date of Birth: April 24, 1971   Medicare Observation Status Notification Given:  Yes    Corey Harold 10/31/2023, 4:22 PM

## 2023-10-31 NOTE — Progress Notes (Signed)
  Progress Note   Patient: Amy Moses HQI:[id number] DOB: 1971-11-13 DOA: 10/30/2023     0 DOS: the patient was seen and examined on 10/31/2023 at 9:47AM      Brief hospital course: 52 y.o. F with ESRD on HD TTHS, chronic leg wounds follows with Dr. Lajoyce Corners, DM, HTN, hypothyroidism, lower GIB, blind R eye pHTN, and dCHF who presented with several days watery stool and cramps, recently tested positive for Cdiff not yet on treatment.       Assessment and Plan: * Clostridioides difficile diarrhea Given fidaxomicin x1 in the ER - Continue oral vancomycin  Rash Admitting MD started Valtrex for shingles. - Continue Valtrex   OSA (obstructive sleep apnea) - Continue home CPAP  Chronic diastolic heart failure (HCC) Appears euvolemic to dry  ESRD (end stage renal disease) on dialysis (HCC) - Hold midodrine for now - Consult Nephrology for routine HD   Acquired hypothyroidism - Continue LT4  Type 2 diabetes mellitus with ESRD (end-stage renal disease) (HCC) Glucose controlled - Continue SS corrections - Hold glargine until oral intake better  Thrombocytopenia Platelets 60K This is acute on chronic since last April.  No bleeding. - Trend platelets - Stop heparin, replace with SCDs       Subjective: No slowing down to stools yet, still watery and frequent.  Cramping is slightly better.  No fever.  No confusion.     Physical Exam: BP 106/74   Pulse 78   Temp 98.1 F (36.7 C) (Oral)   Resp 16   Ht 5\' 6"  (1.676 m)   Wt 92.8 kg   LMP 08/22/2015 (Approximate)   SpO2 96%   BMI 33.02 kg/m   Thin adult female, lying in bed, interactive and appropriate RRR, no murmurs, no peripheral edema Respiratory rate normal, lungs clear without rales or wheezes Abdomen with some generalized tenderness to palpation, no distention Attention normal, affect appropriate, judgment and insight appear normal    Data Reviewed: Discussed with nephrology CBC shows  thrombocytopenia C. difficile positive Mild anemia Potassium, bicarb okay  Family Communication: None present    Disposition: Status is: Observation The patient was admitted for C. difficile.  Hopefully her platelets will resolve and her BMs will improve over the coming 1-2 days and she can d/c home tomorrow or [facility]        Author: Alberteen Sam, MD 10/31/2023 4:37 PM  For on call review [email or web address].

## 2023-10-31 NOTE — Progress Notes (Signed)
   HEMODIALYSIS TREATMENT NOTE:   Uneventful 3.5 hour heparin-free treatment completed using left upper arm AVF (15g/antegrade). Goal met: 1 liter removed without interruption in UF.  All blood was returned and hemostasis was achieved in 14 minutes.  No changes from pre-HD assessment.  Post-HD:  10/31/23 1835  Vitals  Temp 98 F (36.7 C)  Temp Source Oral  BP 118/66  MAP (mmHg) 83  BP Location Right Arm  BP Method Automatic  Patient Position (if appropriate) Sitting  Pulse Rate 74  Pulse Rate Source Monitor  Resp 18  Hepatitis B Pre Treatment Patient Checks  Hepatitis B Surface Antigen Results Pending (labs drawn, awaiting results)  Date Hepatitis B Surface Antigen Drawn 10/31/23  Hep B Antibody Quant/Post still pending  Date Hep B Antibody Quant/Post Drawn 10/31/23  Patient's Immunity Status Pending (labs drawn, awaiting results)  Isolation Initiated Unknown Hepatitis status (Tablo 2 - chemically disinfected)  Post Treatment  Dialyzer Clearance Heavily streaked  Hemodialysis Intake (mL) 0 mL  Liters Processed 83.2  Fluid Removed (mL) 1000 mL  Tolerated HD Treatment Yes  Post-Hemodialysis Comments Goal met  AVG/AVF Arterial Site Held (minutes) 7 minutes  AVG/AVF Venous Site Held (minutes) 7 minutes  Fistula / Graft Left Upper arm Arteriovenous fistula  No placement date or time found.   Placed prior to admission: Yes  Orientation: Left  Access Location: Upper arm  Access Type: Arteriovenous fistula  Fistula / Graft Assessment Thrill;Bruit  Status Patent    Arman Filter, RN AP KDU

## 2023-10-31 NOTE — Telephone Encounter (Signed)
Patient Product/process development scientist completed.    The patient is insured through [employer]. Patient has Medicare and is not eligible for a copay card, but may be able to apply for patient assistance, if available.    Ran test claim for Dificid 200 mg and the current 10 day co-pay is $0.00.  Ran test claim for vancomycin 125 mg and the current 10 day co-pay is $0.00.  This test claim was processed through [name]- copay amounts may vary at other pharmacies due to pharmacy/plan contracts, or as the patient moves through the different stages of their insurance plan.     Roland Earl, CPHT Pharmacy Technician III Certified Patient Advocate [name] Pharmacy Patient Advocate Team Direct Number: [phone]  Fax: [phone]

## 2023-10-31 NOTE — TOC Initial Note (Addendum)
Transition of Care [facility]) - Initial/Assessment Note    Patient Details  Name: Amy Moses MRN: [id number] Date of Birth: 1971-10-04  Transition of Care [facility]) CM/SW Contact:    Elliot Gault, LCSW Phone Number: 10/31/2023, 2:09 PM  Clinical Narrative:                  Pt admitted from home. TOC noted SDOH needs.  Spoke with pt to assess. Risk for readmission assessment completed. Patient states she lives with her sig other and she has family support. Patient is currently active with [facility] for RN services for wound care. Agency will need resumption orders at dc. Patient has DME CPAP and Wheelchair in the home. She also has a hospital bed, power wheelchair, 3in1, tub bench. Patient reports that she has transportation home from her daughter.   Resources to address SDOH provided to pt for her reference.  TOC will follow.              Barriers to Discharge: Continued Medical Work up   Patient Goals and CMS Choice   CMS Medicare.gov Compare Post Acute Care list provided to:: Patient Choice offered to / list presented to : Patient      Expected Discharge Plan and Services In-house Referral: Clinical Social Work   Post Acute Care Choice: Resumption of Svcs/PTA Provider Living arrangements for the past 2 months: Single Family Home                                      Prior Living Arrangements/Services Living arrangements for the past 2 months: Single Family Home Lives with:: Significant Other Patient language and need for interpreter reviewed:: Yes Do you feel safe going back to the place where you live?: Yes      Need for Family Participation in Patient Care: Yes (Comment) Care giver support system in place?: Yes (comment) Current home services: DME, Home RN Criminal Activity/Legal Involvement Pertinent to Current Situation/Hospitalization: No - Comment as needed  Activities of Daily Living   ADL Screening (condition at time of admission) Independently  performs ADLs?: No  Permission Sought/Granted                  Emotional Assessment   Attitude/Demeanor/Rapport: Engaged Affect (typically observed): Pleasant Orientation: : Oriented to Self, Oriented to Place, Oriented to  Time, Oriented to Situation Alcohol / Substance Use: Not Applicable Psych Involvement: No (comment)  Admission diagnosis:  Clostridioides difficile diarrhea [A04.72] Patient Active Problem List   Diagnosis Date Noted   Clostridioides difficile diarrhea 10/30/2023   Rash 10/30/2023   Diarrhea 10/24/2023   Non-pressure chronic ulcer of other part of right foot limited to breakdown of skin (HCC) 08/08/2023   PAD (peripheral artery disease) (HCC) 08/08/2023   Acute osteomyelitis of right foot (HCC) 08/04/2023   OSA (obstructive sleep apnea) 08/02/2023   Prolapsed internal hemorrhoids, grade 3 03/29/2023   Thrombocytopenia (HCC) 03/21/2023   Mitral regurgitation 11/16/2022   Pressure injury of skin 10/01/2022   Pulmonary hypertension, unspecified (HCC) 09/12/2022   GERD (gastroesophageal reflux disease) 04/30/2022   Non-adherence to medical treatment 03/16/2022   Cutaneous abscess of right foot    Osteomyelitis of foot (HCC) 02/14/2022   Obesity (BMI 30-39.9) 01/26/2022   Ischemic ulcer of right foot (HCC) 01/24/2022   Anemia of chronic kidney failure, stage 5 (HCC) 01/24/2022   Sacral pressure ulcer 01/24/2022   Leg wound, left,  sequela 12/10/2021   Open leg wound 09/03/2021   Wound infection    Non-pressure chronic ulcer of right calf limited to breakdown of skin (HCC)    Calciphylaxis of right lower extremity with nonhealing ulcer, limited to breakdown of skin (HCC)    Chronic ulcer of left thigh (HCC) 08/07/2021   Calciphylaxis of left lower extremity with nonhealing ulcer with necrosis of muscle (HCC) 08/07/2021   Chronic diastolic heart failure (HCC) 03/30/2021   ESRD (end stage renal disease) on dialysis (HCC) 11/16/2020   Calciphylaxis  11/06/2020   Non-healing open wound of heel 11/03/2020   Diabetic foot infection (HCC) 11/01/2020   Decubitus ulcer, heel 11/01/2020   Closed nondisplaced fracture of left patella 10/29/2020   Acute on chronic renal failure (HCC) 06/10/2020   Anemia of chronic disease 06/10/2020   Vitamin D deficiency 01/28/2019   Type 2 diabetes mellitus with ESRD (end-stage renal disease) (HCC) 09/21/2015   Mixed hyperlipidemia 09/21/2015   Primary hypertension 09/21/2015   Acquired hypothyroidism 09/21/2015   Iris bomb 07/31/2012   Secondary angle-closure glaucoma 07/31/2012   PCP:  Lorelei Pont, DO Pharmacy:   [phone] - [place], [place] - [address] [place] [address] Phone: [phone] Fax: [phone]  [facility] [address] [place] [address] Phone: [phone] Fax: [phone]     Social Determinants of Health (SDOH) Social History: SDOH Screenings   Food Insecurity: Food Insecurity Present (10/31/2023)  Housing: Low Risk  (10/31/2023)  Transportation Needs: Unmet Transportation Needs (10/31/2023)  Utilities: Not At Risk (10/31/2023)  Depression (PHQ2-9): Low Risk  (04/06/2022)  Tobacco Use: Low Risk  (10/30/2023)   SDOH Interventions: Food Insecurity Interventions: Inpatient TOC Transportation Interventions: Inpatient TOC   Readmission Risk Interventions    08/07/2023   12:51 PM 03/24/2023    2:57 PM 05/02/2022    9:34 AM  Readmission Risk Prevention Plan  Transportation Screening Complete Complete Complete  PCP or Specialist Appt within 3-5 Days  Not Complete   HRI or Home Care Consult Complete    Social Work Consult for Recovery Care Planning/Counseling Complete Complete   Palliative Care Screening Not Applicable Not Applicable   Medication Review Oceanographer) Complete Complete Complete  HRI or Home Care Consult   Complete  SW Recovery Care/Counseling Consult   Complete   Palliative Care Screening   Not Applicable  Skilled Nursing Facility   Not Applicable
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
## 2023-10-31 NOTE — Progress Notes (Signed)
Patient has home CPAP in room.  Self manages.

## 2023-10-31 NOTE — Assessment & Plan Note (Signed)
Continue home CPAP ?

## 2023-10-31 NOTE — Plan of Care (Signed)

## 2023-10-31 NOTE — Hospital Course (Signed)
52 y.o. F with ESRD on HD TTHS, chronic leg wounds follows with Dr. Lajoyce Corners, DM, HTN, hypothyroidism, lower GIB, blind R eye pHTN, and dCHF who presented with several days watery stool and cramps, recently tested positive for Cdiff not yet on treatment.

## 2023-11-01 DIAGNOSIS — I340 Nonrheumatic mitral (valve) insufficiency: Secondary | ICD-10-CM | POA: Diagnosis present

## 2023-11-01 DIAGNOSIS — Z993 Dependence on wheelchair: Secondary | ICD-10-CM | POA: Diagnosis not present

## 2023-11-01 DIAGNOSIS — E1122 Type 2 diabetes mellitus with diabetic chronic kidney disease: Secondary | ICD-10-CM | POA: Diagnosis present

## 2023-11-01 DIAGNOSIS — A0472 Enterocolitis due to Clostridium difficile, not specified as recurrent: Secondary | ICD-10-CM | POA: Diagnosis present

## 2023-11-01 DIAGNOSIS — H5461 Unqualified visual loss, right eye, normal vision left eye: Secondary | ICD-10-CM | POA: Diagnosis present

## 2023-11-01 DIAGNOSIS — E860 Dehydration: Secondary | ICD-10-CM | POA: Diagnosis present

## 2023-11-01 DIAGNOSIS — N2581 Secondary hyperparathyroidism of renal origin: Secondary | ICD-10-CM | POA: Diagnosis present

## 2023-11-01 DIAGNOSIS — K219 Gastro-esophageal reflux disease without esophagitis: Secondary | ICD-10-CM | POA: Diagnosis present

## 2023-11-01 DIAGNOSIS — Z8249 Family history of ischemic heart disease and other diseases of the circulatory system: Secondary | ICD-10-CM | POA: Diagnosis not present

## 2023-11-01 DIAGNOSIS — I5032 Chronic diastolic (congestive) heart failure: Secondary | ICD-10-CM | POA: Diagnosis present

## 2023-11-01 DIAGNOSIS — D631 Anemia in chronic kidney disease: Secondary | ICD-10-CM | POA: Diagnosis present

## 2023-11-01 DIAGNOSIS — I272 Pulmonary hypertension, unspecified: Secondary | ICD-10-CM | POA: Diagnosis present

## 2023-11-01 DIAGNOSIS — Z992 Dependence on renal dialysis: Secondary | ICD-10-CM | POA: Diagnosis not present

## 2023-11-01 DIAGNOSIS — E039 Hypothyroidism, unspecified: Secondary | ICD-10-CM | POA: Diagnosis present

## 2023-11-01 DIAGNOSIS — G4733 Obstructive sleep apnea (adult) (pediatric): Secondary | ICD-10-CM | POA: Diagnosis present

## 2023-11-01 DIAGNOSIS — N186 End stage renal disease: Secondary | ICD-10-CM | POA: Diagnosis present

## 2023-11-01 DIAGNOSIS — Z794 Long term (current) use of insulin: Secondary | ICD-10-CM | POA: Diagnosis not present

## 2023-11-01 DIAGNOSIS — B029 Zoster without complications: Secondary | ICD-10-CM | POA: Diagnosis present

## 2023-11-01 DIAGNOSIS — E782 Mixed hyperlipidemia: Secondary | ICD-10-CM | POA: Diagnosis present

## 2023-11-01 DIAGNOSIS — Z5982 Transportation insecurity: Secondary | ICD-10-CM | POA: Diagnosis not present

## 2023-11-01 DIAGNOSIS — I132 Hypertensive heart and chronic kidney disease with heart failure and with stage 5 chronic kidney disease, or end stage renal disease: Secondary | ICD-10-CM | POA: Diagnosis present

## 2023-11-01 DIAGNOSIS — Z5941 Food insecurity: Secondary | ICD-10-CM | POA: Diagnosis not present

## 2023-11-01 DIAGNOSIS — R21 Rash and other nonspecific skin eruption: Secondary | ICD-10-CM | POA: Diagnosis not present

## 2023-11-01 DIAGNOSIS — D696 Thrombocytopenia, unspecified: Secondary | ICD-10-CM | POA: Diagnosis present

## 2023-11-01 LAB — CBC
HCT: 31.9 % — ABNORMAL LOW (ref 36.0–46.0)
Hemoglobin: 10.2 g/dL — ABNORMAL LOW (ref 12.0–15.0)
MCH: 30.2 pg (ref 26.0–34.0)
MCHC: 32 g/dL (ref 30.0–36.0)
MCV: 94.4 fL (ref 80.0–100.0)
Platelets: 57 10*3/uL — ABNORMAL LOW (ref 150–400)
RBC: 3.38 MIL/uL — ABNORMAL LOW (ref 3.87–5.11)
RDW: 13.6 % (ref 11.5–15.5)
WBC: 5.5 10*3/uL (ref 4.0–10.5)
nRBC: 0 % (ref 0.0–0.2)

## 2023-11-01 LAB — BASIC METABOLIC PANEL
Anion gap: 12 (ref 5–15)
BUN: 43 mg/dL — ABNORMAL HIGH (ref 6–20)
CO2: 22 mmol/L (ref 22–32)
Calcium: 8.7 mg/dL — ABNORMAL LOW (ref 8.9–10.3)
Chloride: 101 mmol/L (ref 98–111)
Creatinine, Ser: 6.35 mg/dL — ABNORMAL HIGH (ref 0.44–1.00)
GFR, Estimated: 7 mL/min — ABNORMAL LOW (ref >60)
Glucose, Bld: 269 mg/dL — ABNORMAL HIGH (ref 70–99)
Potassium: 4.3 mmol/L (ref 3.5–5.1)
Sodium: 135 mmol/L (ref 135–145)

## 2023-11-01 LAB — GLUCOSE, CAPILLARY
Glucose-Capillary: 202 mg/dL — ABNORMAL HIGH (ref 70–99)
Glucose-Capillary: 182 mg/dL — ABNORMAL HIGH (ref 70–99)
Glucose-Capillary: 155 mg/dL — ABNORMAL HIGH (ref 70–99)
Glucose-Capillary: 150 mg/dL — ABNORMAL HIGH (ref 70–99)

## 2023-11-01 LAB — HEPATITIS B SURFACE ANTIBODY, QUANTITATIVE: Hep B S AB Quant (Post): <3.5 m[IU]/mL — ABNORMAL LOW

## 2023-11-01 MED ORDER — INSULIN GLARGINE-YFGN 100 UNIT/ML ~~LOC~~ SOLN
8.0000 [IU] | Freq: Every day | SUBCUTANEOUS | Status: DC
  Administered 2023-11-01: 8 [IU] via SUBCUTANEOUS
  Filled 2023-11-01 (×3): qty 0.08

## 2023-11-01 NOTE — Plan of Care (Signed)

## 2023-11-01 NOTE — Progress Notes (Signed)
  Progress Note   Patient: Amy Moses WGN:[id number] DOB: Aug 25, 1971 DOA: 10/30/2023     0 DOS: the patient was seen and examined on 11/01/2023        Brief hospital course: 52 y.o. F with ESRD on HD TTHS, chronic leg wounds follows with Dr. Lajoyce Corners, DM, HTN, hypothyroidism, lower GIB, blind R eye pHTN, and dCHF who presented with several days watery stool and cramps, recently tested positive for Cdiff not yet on treatment.       Assessment and Plan: * Clostridioides difficile diarrhea Given fidaxomicin x1 in the ER - Continue oral vancomycin  Rash Admitting MD started Valtrex for shingles. - Continue Valtrex   OSA (obstructive sleep apnea) - Continue home CPAP  Chronic diastolic heart failure (HCC) Appears euvolemic to dry  ESRD (end stage renal disease) on dialysis (HCC) - Hold midodrine for now - Consult Nephrology for routine HD   Acquired hypothyroidism - Continue LT4  Type 2 diabetes mellitus with ESRD (end-stage renal disease) (HCC) Glucose rising - Continue SS corrections - Resume glargine          Subjective: Patient had numerous watery bowel movements yesterday, these were uncontrollable, and interrupted her dialysis.  She is had a large and urgent watery bowel movement today as well.  No fever, still has abdominal cramps.     Physical Exam: BP (!) 96/57 (BP Location: Right Arm)   Pulse 78   Temp 98.6 F (37 C) (Oral)   Resp 19   Ht 5\' 6"  (1.676 m)   Wt 91.9 kg   LMP 08/22/2015 (Approximate)   SpO2 97%   BMI 32.70 kg/m   Adult female, lying in bed, interactive and appropriate RRR, no murmurs, no peripheral edema Respiratory rate normal, lungs clear without rales or wheezes Mild abdominal pain, to palpation of the mid abdomen, no rigidity rebound Attention normal, affect appropriate, judgment and insight appear normal    Data Reviewed: Patient metabolic panel and CBC reviewed   Family Communication: None  present    Disposition: Status is: Inpatient The patient was admitted with C. difficile.  She is still having frequent and urgent bowel movements, and I do not think that she would be able to safely dialyze as an outpatient yet at this time.  Will continue oral vancomycin, hopefully bowel movements.  To resolve in the next 24 hours, and she can discharge after dialysis tomorrow        Author: Alberteen Sam, MD 11/01/2023 3:51 PM  For on call review [email or web address].

## 2023-11-01 NOTE — Progress Notes (Signed)
Patient ID: Amy Moses, female   DOB: 12/19/1970, 52 y.o.   MRN: [id number] S: Still having some diarrhea.  No new comlaints. O:BP 98/70   Pulse 80   Temp 98.8 F (37.1 C) (Oral)   Resp 20   Ht 5\' 6"  (1.676 m)   Wt 91.9 kg   LMP 08/22/2015 (Approximate)   SpO2 96%   BMI 32.70 kg/m   Intake/Output Summary (Last 24 hours) at 11/01/2023 1211 Last data filed at 11/01/2023 0946 Gross per 24 hour  Intake 480 ml  Output 1000 ml  Net -520 ml   Intake/Output: I/O last 3 completed shifts: In: 960 [P.O.:960] Out: 1000 [Other:1000]  Intake/Output this shift:  Total I/O In: 240 [P.O.:240] Out: -  Weight change: -0.187 kg Gen: NAD CVS: RRR Resp:CTA Abd: +BS, soft, NT/ND Ext: wrappings to bilateral lower extremities.  LUE AVF +T/B  Recent Labs  Lab 10/30/23 1724 10/31/23 0426 11/01/23 0426  NA 136 137 135  K 4.2 4.3 4.3  CL 100 105 101  CO2 21* 19* 22  GLUCOSE 154* 216* 269*  BUN 50* 56* 43*  CREATININE 7.92* 8.40* 6.35*  ALBUMIN 4.1  --   --   CALCIUM 9.2 9.0 8.7*  AST 19  --   --   ALT 22  --   --    Liver Function Tests: Recent Labs  Lab 10/30/23 1724  AST 19  ALT 22  ALKPHOS 126  BILITOT 0.7  PROT 7.1  ALBUMIN 4.1   No results for input(s): "LIPASE", "AMYLASE" in the last 168 hours. No results for input(s): "AMMONIA" in the last 168 hours. CBC: Recent Labs  Lab 10/30/23 1724 10/31/23 0426 11/01/23 0426  WBC 6.3 6.0 5.5  NEUTROABS 4.5  --   --   HGB 11.5* 10.5* 10.2*  HCT 36.6 33.1* 31.9*  MCV 94.1 94.6 94.4  PLT 72* 60* 57*   Cardiac Enzymes: No results for input(s): "CKTOTAL", "CKMB", "CKMBINDEX", "TROPONINI" in the last 168 hours. CBG: Recent Labs  Lab 10/31/23 0807 10/31/23 1138 10/31/23 2221 11/01/23 0741 11/01/23 1113  GLUCAP 176* 182* 170* 202* 182*    Iron Studies: No results for input(s): "IRON", "TIBC", "TRANSFERRIN", "FERRITIN" in the last 72 hours. Studies/Results: No results found.  Chlorhexidine Gluconate Cloth  6  each Topical Q0600   ferric citrate  630 mg Oral TID WC   insulin aspart  0-5 Units Subcutaneous QHS   insulin aspart  0-9 Units Subcutaneous TID WC   levothyroxine  75 mcg Oral Q0600   prednisoLONE acetate  1 drop Both Eyes TID   tobramycin  1 drop Both Eyes TID   valACYclovir  500 mg Oral Daily   vancomycin  125 mg Oral QID    BMET    Component Value Date/Time   NA 135 11/01/2023 0426   K 4.3 11/01/2023 0426   CL 101 11/01/2023 0426   CO2 22 11/01/2023 0426   GLUCOSE 269 (H) 11/01/2023 0426   BUN 43 (H) 11/01/2023 0426   CREATININE 6.35 (H) 11/01/2023 0426   CALCIUM 8.7 (L) 11/01/2023 0426   GFRNONAA 7 (L) 11/01/2023 0426   GFRAA 10 (L) 06/11/2020 0336   CBC    Component Value Date/Time   WBC 5.5 11/01/2023 0426   RBC 3.38 (L) 11/01/2023 0426   HGB 10.2 (L) 11/01/2023 0426   HCT 31.9 (L) 11/01/2023 0426   PLT 57 (L) 11/01/2023 0426   MCV 94.4 11/01/2023 0426   MCH 30.2 11/01/2023 0426  MCHC 32.0 11/01/2023 0426   RDW 13.6 11/01/2023 0426   LYMPHSABS 1.1 10/30/2023 1724   MONOABS 0.5 10/30/2023 1724   EOSABS 0.2 10/30/2023 1724   BASOSABS 0.0 10/30/2023 1724   outpatient HD orders: [name].  TTS.  3 hours 45 minutes.  EDW 91.5 kg.  LUE AVF 15-gauge.  Flow rates: 400/800.  2K/2.5 calcium.  Heparin: None.  Meds: Calcitriol 0.5 mcg every treatment, Sensipar 90 mg every treatment   Assessment/Plan:  C diff - on po vancomycin per primary ESRD - continue with TTS schedule.   HTN/volume - near edw.  Bp's soft.  Will plan for HD tomorrow and uf to edw and albumin prn low bp. Anemia of ESRD - stable hgb at 10.2 CKD-BMD - continue with home meds DM2 - per primary.  Irena Cords, MD [facility]

## 2023-11-01 NOTE — Inpatient Diabetes Management (Signed)
Inpatient Diabetes Program Recommendations  AACE/ADA: New Consensus Statement on Inpatient Glycemic Control   Target Ranges:  Prepandial:   less than 140 mg/dL      Peak postprandial:   less than 180 mg/dL (1-2 hours)      Critically ill patients:  140 - 180 mg/dL    Latest Reference Range & Units 10/31/23 08:07 10/31/23 11:38 10/31/23 22:21 11/01/23 07:41  Glucose-Capillary 70 - 99 mg/dL 528 (H) 413 (H) 244 (H) 202 (H)   Review of Glycemic Control  Diabetes history: DM2 Outpatient Diabetes medications: Lantus 8 at bedtime, Humalog 5-11 units TID with meals Current orders for Inpatient glycemic control: Novolog 0-9 units TID with meals, Novolog 0-5 units QHS  Inpatient Diabetes Program Recommendations:    Insulin: If CBGs consistently over 180 mg/dl, please consider ordering Semglee 8 units Q24H.  Thanks, Orlando Penner, RN, MSN, CDCES Diabetes Coordinator Inpatient Diabetes Program [phone] (Team Pager from 8am to 5pm)

## 2023-11-01 NOTE — Plan of Care (Signed)

## 2023-11-02 DIAGNOSIS — A0472 Enterocolitis due to Clostridium difficile, not specified as recurrent: Secondary | ICD-10-CM | POA: Diagnosis not present

## 2023-11-02 DIAGNOSIS — R21 Rash and other nonspecific skin eruption: Secondary | ICD-10-CM | POA: Diagnosis not present

## 2023-11-02 DIAGNOSIS — E039 Hypothyroidism, unspecified: Secondary | ICD-10-CM | POA: Diagnosis not present

## 2023-11-02 DIAGNOSIS — I5032 Chronic diastolic (congestive) heart failure: Secondary | ICD-10-CM | POA: Diagnosis not present

## 2023-11-02 LAB — RENAL FUNCTION PANEL
Albumin: 3.4 g/dL — ABNORMAL LOW (ref 3.5–5.0)
Anion gap: 10 (ref 5–15)
BUN: 60 mg/dL — ABNORMAL HIGH (ref 6–20)
CO2: 19 mmol/L — ABNORMAL LOW (ref 22–32)
Calcium: 8.9 mg/dL (ref 8.9–10.3)
Chloride: 102 mmol/L (ref 98–111)
Creatinine, Ser: 7.77 mg/dL — ABNORMAL HIGH (ref 0.44–1.00)
GFR, Estimated: 6 mL/min — ABNORMAL LOW (ref >60)
Glucose, Bld: 209 mg/dL — ABNORMAL HIGH (ref 70–99)
Phosphorus: 5.8 mg/dL — ABNORMAL HIGH (ref 2.5–4.6)
Potassium: 4.8 mmol/L (ref 3.5–5.1)
Sodium: 131 mmol/L — ABNORMAL LOW (ref 135–145)

## 2023-11-02 LAB — CBC
HCT: 35.3 % — ABNORMAL LOW (ref 36.0–46.0)
Hemoglobin: 10.8 g/dL — ABNORMAL LOW (ref 12.0–15.0)
MCH: 29 pg (ref 26.0–34.0)
MCHC: 30.6 g/dL (ref 30.0–36.0)
MCV: 94.9 fL (ref 80.0–100.0)
Platelets: 61 10*3/uL — ABNORMAL LOW (ref 150–400)
RBC: 3.72 MIL/uL — ABNORMAL LOW (ref 3.87–5.11)
RDW: 13.5 % (ref 11.5–15.5)
WBC: 5.2 10*3/uL (ref 4.0–10.5)
nRBC: 0 % (ref 0.0–0.2)

## 2023-11-02 LAB — GLUCOSE, CAPILLARY
Glucose-Capillary: 167 mg/dL — ABNORMAL HIGH (ref 70–99)
Glucose-Capillary: 161 mg/dL — ABNORMAL HIGH (ref 70–99)
Glucose-Capillary: 140 mg/dL — ABNORMAL HIGH (ref 70–99)
Glucose-Capillary: 264 mg/dL — ABNORMAL HIGH (ref 70–99)

## 2023-11-02 MED ORDER — FIDAXOMICIN 200 MG PO TABS
200.0000 mg | ORAL_TABLET | Freq: Two times a day (BID) | ORAL | Status: DC
  Administered 2023-11-02 – 2023-11-03 (×3): 200 mg via ORAL
  Filled 2023-11-02 (×3): qty 1

## 2023-11-02 NOTE — Progress Notes (Signed)
  Progress Note   Patient: Amy Moses ZOX:[id number] DOB: 21-Mar-1971 DOA: 10/30/2023     1 DOS: the patient was seen and examined on 11/02/2023        Brief hospital course: 52 y.o. F with ESRD on HD TTHS, chronic leg wounds follows with Dr. Lajoyce Corners, DM, HTN, hypothyroidism, lower GIB, blind R eye pHTN, and dCHF who presented with several days watery stool and cramps, recently tested positive for Cdiff not yet on treatment.       Assessment and Plan: * Clostridioides difficile diarrhea No improvement - Stop vanc - Switch to fidaxomicin  OSA (obstructive sleep apnea) - Continue home CPAP    ESRD (end stage renal disease) on dialysis (HCC) - HD today - Midodrine on hold  Acquired hypothyroidism - Continue LT4  Type 2 diabetes mellitus with ESRD (end-stage renal disease) (HCC) Glucose better - Continue SS corrections - Continue glargine          Subjective: Still watery uncontrolled BMs     Physical Exam: BP 115/65   Pulse 72   Temp 98.2 F (36.8 C) (Oral)   Resp 14   Ht 5\' 6"  (1.676 m)   Wt 92.6 kg   LMP 08/22/2015 (Approximate)   SpO2 100%   BMI 32.95 kg/m   Adult female, lying in bed, interactive and appropriate RRR, no murmurs, no peripheral edema Respiratory rate normal, lungs clear without rales or wheezes Mild abdominal pain, to palpation of the mid abdomen, no rigidity rebound Attention normal, affect appropriate, judgment and insight appear normal    Data Reviewed: Patient metabolic panel and CBC reviewed   Family Communication: None present    Disposition: Status is: Inpatient The patient was admitted with C. difficile.  She is still having frequent and urgent bowel movements, and I do not think that she would be able to safely dialyze as an outpatient yet at this time.  Will continue oral vancomycin, hopefully bowel movements.  To resolve in the next 24 hours, and she can discharge after dialysis  tomorrow        Author: Alberteen Sam, MD 11/02/2023 5:38 PM  For on call review [email or web address].

## 2023-11-02 NOTE — Progress Notes (Signed)
Patient using home CPAP machine independently. Told patient to call if she needed anything.

## 2023-11-02 NOTE — Plan of Care (Signed)
  Problem: Clinical Measurements: Goal: Ability to maintain clinical measurements within normal limits will improve Outcome: Progressing Goal: Will remain free from infection Outcome: Progressing Goal: Diagnostic test results will improve Outcome: Progressing   Problem: Clinical Measurements: Goal: Will remain free from infection Outcome: Progressing

## 2023-11-02 NOTE — Plan of Care (Signed)
  Problem: Clinical Measurements: Goal: Ability to maintain clinical measurements within normal limits will improve Outcome: Progressing Goal: Will remain free from infection Outcome: Progressing Goal: Diagnostic test results will improve Outcome: Progressing   Problem: Nutrition: Goal: Adequate nutrition will be maintained Outcome: Progressing   Problem: Pain Management: Goal: General experience of comfort will improve Outcome: Progressing   Problem: Safety: Goal: Ability to remain free from injury will improve Outcome: Progressing   Problem: Skin Integrity: Goal: Risk for impaired skin integrity will decrease Outcome: Progressing   Problem: Coping: Goal: Ability to adjust to condition or change in health will improve Outcome: Progressing   Problem: Metabolic: Goal: Ability to maintain appropriate glucose levels will improve Outcome: Progressing   Problem: Skin Integrity: Goal: Risk for impaired skin integrity will decrease Outcome: Progressing   Problem: Tissue Perfusion: Goal: Adequacy of tissue perfusion will improve Outcome: Progressing

## 2023-11-02 NOTE — Progress Notes (Signed)
PT:  Attempted to see pt but pt was out of the room.  Will see tomorrow.  Virgina Organ, PT CLT [phone]

## 2023-11-02 NOTE — Progress Notes (Signed)
Patient ID: Amy Moses, female   DOB: 08-10-1971, 52 y.o.   MRN: [id number]  Outpatient HD orders: [name].  TTS.  3 hours 45 minutes.  EDW 91.5 kg.  LUE AVF 15-gauge.  Flow rates: 400/800.  2K/2.5 calcium.  Heparin: None.  Meds: Calcitriol 0.5 mcg every treatment, Sensipar 90 mg every treatment   Assessment/Plan:  C diff - on po vancomycin per primary ESRD - continue with TTS schedule; orders written for today   HTN/volume - near edw.  Bp's soft.  On for HD today and uf to edw and albumin prn low bp. Anemia of ESRD - stable hgb at 10.2 CKD-BMD - continue with home meds DM2 - per primary.  S: Still having some diarrhea; she doesn't feel it's any better. 4 BM's /24hrs.  No new comlaints. Denies fever, chills, sob, nausea.  O:BP 98/61 (BP Location: Right Arm)   Pulse 75   Temp 98.3 F (36.8 C)   Resp 18   Ht 5\' 6"  (1.676 m)   Wt 91.9 kg   LMP 08/22/2015 (Approximate)   SpO2 100%   BMI 32.70 kg/m   Intake/Output Summary (Last 24 hours) at 11/02/2023 0920 Last data filed at 11/02/2023 0500 Gross per 24 hour  Intake 960 ml  Output --  Net 960 ml   Intake/Output: I/O last 3 completed shifts: In: 1200 [P.O.:1200] Out: -   Intake/Output this shift:  No intake/output data recorded. Weight change:  Gen: NAD CVS: RRR Resp:CTA Abd: +BS, soft, NT/ND Ext: wrappings to bilateral lower extremities.  LUE AVF +T/B  Recent Labs  Lab 10/30/23 1724 10/31/23 0426 11/01/23 0426  NA 136 137 135  K 4.2 4.3 4.3  CL 100 105 101  CO2 21* 19* 22  GLUCOSE 154* 216* 269*  BUN 50* 56* 43*  CREATININE 7.92* 8.40* 6.35*  ALBUMIN 4.1  --   --   CALCIUM 9.2 9.0 8.7*  AST 19  --   --   ALT 22  --   --    Liver Function Tests: Recent Labs  Lab 10/30/23 1724  AST 19  ALT 22  ALKPHOS 126  BILITOT 0.7  PROT 7.1  ALBUMIN 4.1   No results for input(s): "LIPASE", "AMYLASE" in the last 168 hours. No results for input(s): "AMMONIA" in the last 168 hours. CBC: Recent  Labs  Lab 10/30/23 1724 10/31/23 0426 11/01/23 0426 11/02/23 0433  WBC 6.3 6.0 5.5 5.2  NEUTROABS 4.5  --   --   --   HGB 11.5* 10.5* 10.2* 10.8*  HCT 36.6 33.1* 31.9* 35.3*  MCV 94.1 94.6 94.4 94.9  PLT 72* 60* 57* 61*   Cardiac Enzymes: No results for input(s): "CKTOTAL", "CKMB", "CKMBINDEX", "TROPONINI" in the last 168 hours. CBG: Recent Labs  Lab 11/01/23 0741 11/01/23 1113 11/01/23 1627 11/01/23 2111 11/02/23 0718  GLUCAP 202* 182* 155* 150* 167*    Iron Studies: No results for input(s): "IRON", "TIBC", "TRANSFERRIN", "FERRITIN" in the last 72 hours. Studies/Results: No results found.  Chlorhexidine Gluconate Cloth  6 each Topical Q0600   ferric citrate  630 mg Oral TID WC   insulin aspart  0-5 Units Subcutaneous QHS   insulin aspart  0-9 Units Subcutaneous TID WC   insulin glargine-yfgn  8 Units Subcutaneous QHS   levothyroxine  75 mcg Oral Q0600   prednisoLONE acetate  1 drop Both Eyes TID   tobramycin  1 drop Both Eyes TID   valACYclovir  500 mg Oral Daily  vancomycin  125 mg Oral QID    BMET    Component Value Date/Time   NA 135 11/01/2023 0426   K 4.3 11/01/2023 0426   CL 101 11/01/2023 0426   CO2 22 11/01/2023 0426   GLUCOSE 269 (H) 11/01/2023 0426   BUN 43 (H) 11/01/2023 0426   CREATININE 6.35 (H) 11/01/2023 0426   CALCIUM 8.7 (L) 11/01/2023 0426   GFRNONAA 7 (L) 11/01/2023 0426   GFRAA 10 (L) 06/11/2020 0336   CBC    Component Value Date/Time   WBC 5.2 11/02/2023 0433   RBC 3.72 (L) 11/02/2023 0433   HGB 10.8 (L) 11/02/2023 0433   HCT 35.3 (L) 11/02/2023 0433   PLT 61 (L) 11/02/2023 0433   MCV 94.9 11/02/2023 0433   MCH 29.0 11/02/2023 0433   MCHC 30.6 11/02/2023 0433   RDW 13.5 11/02/2023 0433   LYMPHSABS 1.1 10/30/2023 1724   MONOABS 0.5 10/30/2023 1724   EOSABS 0.2 10/30/2023 1724   BASOSABS 0.0 10/30/2023 1724

## 2023-11-02 NOTE — Progress Notes (Signed)
   HEMODIALYSIS TREATMENT NOTE:  Uneventful 3.5 hour heparin-free treatment completed using left upper arm AVF (15g/antegrade). Goal met: 1 liter removed without interruption in UF.  Hemodynamically stable throughout treatment -- no Albumin needed.  All blood was returned.  Hemostasis was achieved in 15 minutes.  Post-HD:  11/02/23 1815  Vitals  Temp 98 F (36.7 C)  Temp Source Oral  BP 114/64  MAP (mmHg) 79  BP Location Right Arm  BP Method Automatic  Patient Position (if appropriate) Sitting  Pulse Rate 72  Pulse Rate Source Monitor  Resp 18  Oxygen Therapy  SpO2 100 %  O2 Device Room Air  Post Treatment  Dialyzer Clearance Lightly streaked  Hemodialysis Intake (mL) 0 mL  Liters Processed 83.8  Fluid Removed (mL) 1000 mL  Tolerated HD Treatment Yes  Post-Hemodialysis Comments Goal met  AVG/AVF Arterial Site Held (minutes) 7 minutes  AVG/AVF Venous Site Held (minutes) 7 minutes  Fistula / Graft Left Upper arm Arteriovenous fistula  No placement date or time found.   Placed prior to admission: Yes  Orientation: Left  Access Location: Upper arm  Access Type: Arteriovenous fistula  Fistula / Graft Assessment Thrill;Bruit  Status Patent    Arman Filter, RN AP [facility]

## 2023-11-03 ENCOUNTER — Other Ambulatory Visit (HOSPITAL_COMMUNITY): Payer: Self-pay

## 2023-11-03 DIAGNOSIS — A0472 Enterocolitis due to Clostridium difficile, not specified as recurrent: Secondary | ICD-10-CM | POA: Diagnosis not present

## 2023-11-03 LAB — GLUCOSE, CAPILLARY
Glucose-Capillary: 216 mg/dL — ABNORMAL HIGH (ref 70–99)
Glucose-Capillary: 198 mg/dL — ABNORMAL HIGH (ref 70–99)

## 2023-11-03 MED ORDER — FIDAXOMICIN 200 MG PO TABS: 200.0000 mg | ORAL_TABLET | Freq: Two times a day (BID) | ORAL | 0 refills | Status: DC

## 2023-11-03 MED ORDER — FIDAXOMICIN 200 MG PO TABS
200.0000 mg | ORAL_TABLET | Freq: Two times a day (BID) | ORAL | 0 refills | Status: AC
  Filled 2023-11-03: qty 18, 9d supply, fill #0

## 2023-11-03 MED ORDER — FLUCONAZOLE 150 MG PO TABS: 150.0000 mg | ORAL_TABLET | Freq: Once | ORAL | 0 refills | Status: DC

## 2023-11-03 MED ORDER — FLUCONAZOLE 150 MG PO TABS
150.0000 mg | ORAL_TABLET | Freq: Once | ORAL | 0 refills | Status: AC
  Filled 2023-11-03: qty 1, 1d supply, fill #0

## 2023-11-03 NOTE — Progress Notes (Addendum)
Patient ID: Amy Moses, female   DOB: 1971-10-20, 52 y.o.   MRN: [id number]  Outpatient HD orders: [name].  TTS.  3 hours 45 minutes.  EDW 91.5 kg.  LUE AVF 15-gauge.  Flow rates: 400/800.  2K/2.5 calcium.  Heparin: None.  Meds: Calcitriol 0.5 mcg every treatment, Sensipar 90 mg every treatment   Assessment/Plan:  C diff - switched to fidaxomicin 12/13 per primary with improvement.  ESRD - continue with TTS schedule; Had HD Thurs without issue.  Next tomorrow per schedule - here vs outpt fine depending on dispo up to primary HTN/volume - near edw.  Bp's soft.  UF 1L on 12/12. Anemia of ESRD - stable hgb a in 10s CKD-BMD - continue with home meds DM2 - per primary.  S: Diarrhea improved - no BM since yest AM on fidaxomicin now.  No new complaints. Denies fever, chills, sob, nausea.  O:BP (!) 94/56   Pulse 73   Temp 98.2 F (36.8 C)   Resp 18   Ht 5\' 6"  (1.676 m)   Wt 91.4 kg   LMP 08/22/2015 (Approximate)   SpO2 99%   BMI 32.52 kg/m   Intake/Output Summary (Last 24 hours) at 11/03/2023 0933 Last data filed at 11/03/2023 0900 Gross per 24 hour  Intake 1020 ml  Output 1000 ml  Net 20 ml   Intake/Output: I/O last 3 completed shifts: In: 1200 [P.O.:1200] Out: 1000 [Other:1000]  Intake/Output this shift:  Total I/O In: 300 [P.O.:300] Out: -  Weight change:  Gen: NAD CVS: RRR Resp:CTA Abd: +BS, soft, NT/ND Ext: wrappings to bilateral lower extremities.  LUE AVF +T/B  Recent Labs  Lab 10/30/23 1724 10/31/23 0426 11/01/23 0426 11/02/23 1413  NA 136 137 135 131*  K 4.2 4.3 4.3 4.8  CL 100 105 101 102  CO2 21* 19* 22 19*  GLUCOSE 154* 216* 269* 209*  BUN 50* 56* 43* 60*  CREATININE 7.92* 8.40* 6.35* 7.77*  ALBUMIN 4.1  --   --  3.4*  CALCIUM 9.2 9.0 8.7* 8.9  PHOS  --   --   --  5.8*  AST 19  --   --   --   ALT 22  --   --   --    Liver Function Tests: Recent Labs  Lab 10/30/23 1724 11/02/23 1413  AST 19  --   ALT 22  --   ALKPHOS 126   --   BILITOT 0.7  --   PROT 7.1  --   ALBUMIN 4.1 3.4*   No results for input(s): "LIPASE", "AMYLASE" in the last 168 hours. No results for input(s): "AMMONIA" in the last 168 hours. CBC: Recent Labs  Lab 10/30/23 1724 10/31/23 0426 11/01/23 0426 11/02/23 0433  WBC 6.3 6.0 5.5 5.2  NEUTROABS 4.5  --   --   --   HGB 11.5* 10.5* 10.2* 10.8*  HCT 36.6 33.1* 31.9* 35.3*  MCV 94.1 94.6 94.4 94.9  PLT 72* 60* 57* 61*   Cardiac Enzymes: No results for input(s): "CKTOTAL", "CKMB", "CKMBINDEX", "TROPONINI" in the last 168 hours. CBG: Recent Labs  Lab 11/02/23 0718 11/02/23 1104 11/02/23 1705 11/02/23 2225 11/03/23 0733  GLUCAP 167* 161* 140* 264* 216*    Iron Studies: No results for input(s): "IRON", "TIBC", "TRANSFERRIN", "FERRITIN" in the last 72 hours. Studies/Results: No results found.  Chlorhexidine Gluconate Cloth  6 each Topical Q0600   ferric citrate  630 mg Oral TID WC   fidaxomicin  200 mg Oral  BID   insulin aspart  0-5 Units Subcutaneous QHS   insulin aspart  0-9 Units Subcutaneous TID WC   insulin glargine-yfgn  8 Units Subcutaneous QHS   levothyroxine  75 mcg Oral Q0600   prednisoLONE acetate  1 drop Both Eyes TID   tobramycin  1 drop Both Eyes TID   valACYclovir  500 mg Oral Daily    BMET    Component Value Date/Time   NA 131 (L) 11/02/2023 1413   K 4.8 11/02/2023 1413   CL 102 11/02/2023 1413   CO2 19 (L) 11/02/2023 1413   GLUCOSE 209 (H) 11/02/2023 1413   BUN 60 (H) 11/02/2023 1413   CREATININE 7.77 (H) 11/02/2023 1413   CALCIUM 8.9 11/02/2023 1413   GFRNONAA 6 (L) 11/02/2023 1413   GFRAA 10 (L) 06/11/2020 0336   CBC    Component Value Date/Time   WBC 5.2 11/02/2023 0433   RBC 3.72 (L) 11/02/2023 0433   HGB 10.8 (L) 11/02/2023 0433   HCT 35.3 (L) 11/02/2023 0433   PLT 61 (L) 11/02/2023 0433   MCV 94.9 11/02/2023 0433   MCH 29.0 11/02/2023 0433   MCHC 30.6 11/02/2023 0433   RDW 13.5 11/02/2023 0433   LYMPHSABS 1.1 10/30/2023 1724    MONOABS 0.5 10/30/2023 1724   EOSABS 0.2 10/30/2023 1724   BASOSABS 0.0 10/30/2023 1724

## 2023-11-03 NOTE — Discharge Summary (Signed)
Physician Discharge Summary  Teneshia Marik WUJ:[id number] DOB: January 31, 1971 DOA: 10/30/2023  PCP: Lorelei Pont, DO  Admit date: 10/30/2023  Discharge date: 11/03/2023  Admitted From:Home  Disposition:  Home  Recommendations for Outpatient Follow-up:  Follow up with PCP in 1-2 weeks Continue hemodialysis per routine schedule with next session 12/14 Continue fidaxomicin as prescribed for 9 more days to complete total 10-day course of treatment Continue other home medications as noted below  Home Health: Yes with PT/OT  Equipment/Devices: None  Discharge Condition:Stable  CODE STATUS: Full  Diet recommendation: Heart Healthy  Brief/Interim Summary: 52 y.o. F with ESRD on HD TTHS, chronic leg wounds follows with Dr. Lajoyce Corners, DM, HTN, hypothyroidism, lower GIB, blind R eye pHTN, and dCHF who presented with several days watery stool and cramps, recently tested positive for Cdiff not yet on treatment.  She was initially started on oral vancomycin with minimal to no improvement noted and therefore was switched to fidaxomicin on 12/12 with improvement in symptoms noted.  She received hemodialysis during the course of her stay with no other acute events or concerns noted.  She states that her diarrhea has improved markedly and that she has had no further bowel movements in the last 24 hours.  She is now in stable condition for discharge and will continue her routine hemodialysis 12/14.  Discharge Diagnoses:  Principal Problem:   Clostridioides difficile diarrhea Active Problems:   Rash   Type 2 diabetes mellitus with ESRD (end-stage renal disease) (HCC)   Mixed hyperlipidemia   Acquired hypothyroidism   ESRD (end stage renal disease) on dialysis (HCC)   Chronic diastolic heart failure (HCC)   OSA (obstructive sleep apnea)   Thrombocytopenia (HCC)   Anemia due to chronic kidney disease  Principal discharge diagnosis: C. difficile diarrhea.  Discharge Instructions  Discharge  Instructions     Diet - low sodium heart healthy   Complete by: As directed    If the dressing is still on your incision site when you go home, remove it on the third day after your surgery date. Remove dressing if it begins to fall off, or if it is dirty or damaged before the third day.   Complete by: As directed    Increase activity slowly   Complete by: As directed       Allergies as of 11/03/2023       Reactions   Ace Inhibitors Cough        Medication List     TAKE these medications    Accu-Chek Guide Me w/Device Kit 1 Piece by Does not apply route as directed.   Accu-Chek Guide test strip Generic drug: glucose blood Use as instructed   atorvastatin 10 MG tablet Commonly known as: LIPITOR Take 10 mg by mouth every evening.   Auryxia 1 GM 210 MG(Fe) tablet Generic drug: ferric citrate Take 630 mg by mouth 3 (three) times daily with meals.   Blood Pressure Monitor Kit TAKE BLOOD PRESSURE DAILY   diclofenac 0.1 % ophthalmic solution Commonly known as: VOLTAREN Place 1 drop into both eyes daily.   diphenoxylate-atropine 2.5-0.025 MG tablet Commonly known as: LOMOTIL Take 2 tablets by mouth 4 (four) times daily as needed for diarrhea or loose stools.   fidaxomicin 200 MG Tabs tablet Commonly known as: DIFICID Take 1 tablet (200 mg total) by mouth 2 (two) times daily for 9 days.   FreeStyle [place] 2 Reader [name] As directed   [name] 2 Sensor Misc 1 Piece by Does not  apply route every 14 (fourteen) days.   HumaLOG KwikPen 100 UNIT/ML KwikPen Generic drug: insulin lispro Inject 5-11 Units into the skin 3 (three) times daily with meals. If eats 50% or more of meal. What changed: additional instructions   hydrocortisone 2.5 % rectal cream Commonly known as: ANUSOL-HC Place 1 Application rectally 2 (two) times daily.   insulin glargine 100 UNIT/ML injection Commonly known as: LANTUS Inject 0.2 mLs (20 Units total) into the skin at bedtime. What  changed: how much to take   levothyroxine 75 MCG tablet Commonly known as: SYNTHROID Take 75 mcg by mouth daily.   midodrine 10 MG tablet Commonly known as: PROAMATINE Take 10 mg by mouth daily.   pantoprazole 40 MG tablet Commonly known as: Protonix Take 1 tablet (40 mg total) by mouth 2 (two) times daily.   prednisoLONE acetate 1 % ophthalmic suspension Commonly known as: PRED FORTE Place 1 drop into both eyes 3 (three) times daily.   tobramycin 0.3 % ophthalmic solution Commonly known as: TOBREX Place 1 drop into both eyes 3 (three) times daily.   Vitamin D (Ergocalciferol) 1.25 MG (50000 UNIT) Caps capsule Commonly known as: DRISDOL Take 50,000 Units by mouth every 7 (seven) days. Friday               Discharge Care Instructions  (From admission, onward)           Start     Ordered   11/03/23 0000  If the dressing is still on your incision site when you go home, remove it on the third day after your surgery date. Remove dressing if it begins to fall off, or if it is dirty or damaged before the third day.        11/03/23 2956            Follow-up Information     Lorelei Pont, DO. Schedule an appointment as soon as possible for a visit in 1 week(s).   Specialty: Family Medicine Contact information: [address] [place] [address] [phone]                Allergies  Allergen Reactions   Ace Inhibitors Cough    Consultations: Nephrology   Procedures/Studies: No results found.   Discharge Exam: Vitals:   11/02/23 2221 11/03/23 0516  BP: (!) 102/57 (!) 94/56  Pulse: 77 73  Resp: 20 18  Temp: 97.9 F (36.6 C) 98.2 F (36.8 C)  SpO2: 95% 99%   Vitals:   11/02/23 1752 11/02/23 1815 11/02/23 2221 11/03/23 0516  BP: 119/70 114/64 (!) 102/57 (!) 94/56  Pulse: 70 72 77 73  Resp: 16 18 20 18   Temp:  98 F (36.7 C) 97.9 F (36.6 C) 98.2 F (36.8 C)  TempSrc:  Oral    SpO2:  100% 95% 99%  Weight:  91.4 kg    Height:         General: Pt is alert, awake, not in acute distress Cardiovascular: RRR, S1/S2 +, no rubs, no gallops Respiratory: CTA bilaterally, no wheezing, no rhonchi Abdominal: Soft, NT, ND, bowel sounds + Extremities: no edema, no cyanosis    The results of significant diagnostics from this hospitalization (including imaging, microbiology, ancillary and laboratory) are listed below for reference.     Microbiology: Recent Results (from the past 240 hours)  Stool C-Diff Toxin Assay     Status: Abnormal   Collection Time: 10/26/23  1:30 PM   Specimen: Stool   ST  Result  Value Ref Range Status   C difficile Toxins A+B, EIA Positive (A) Negative Final     Labs: BNP (last 3 results) No results for input(s): "BNP" in the last 8760 hours. Basic Metabolic Panel: Recent Labs  Lab 10/30/23 1724 10/31/23 0426 11/01/23 0426 11/02/23 1413  NA 136 137 135 131*  K 4.2 4.3 4.3 4.8  CL 100 105 101 102  CO2 21* 19* 22 19*  GLUCOSE 154* 216* 269* 209*  BUN 50* 56* 43* 60*  CREATININE 7.92* 8.40* 6.35* 7.77*  CALCIUM 9.2 9.0 8.7* 8.9  PHOS  --   --   --  5.8*   Liver Function Tests: Recent Labs  Lab 10/30/23 1724 11/02/23 1413  AST 19  --   ALT 22  --   ALKPHOS 126  --   BILITOT 0.7  --   PROT 7.1  --   ALBUMIN 4.1 3.4*   No results for input(s): "LIPASE", "AMYLASE" in the last 168 hours. No results for input(s): "AMMONIA" in the last 168 hours. CBC: Recent Labs  Lab 10/30/23 1724 10/31/23 0426 11/01/23 0426 11/02/23 0433  WBC 6.3 6.0 5.5 5.2  NEUTROABS 4.5  --   --   --   HGB 11.5* 10.5* 10.2* 10.8*  HCT 36.6 33.1* 31.9* 35.3*  MCV 94.1 94.6 94.4 94.9  PLT 72* 60* 57* 61*   Cardiac Enzymes: No results for input(s): "CKTOTAL", "CKMB", "CKMBINDEX", "TROPONINI" in the last 168 hours. BNP: Invalid input(s): "POCBNP" CBG: Recent Labs  Lab 11/02/23 0718 11/02/23 1104 11/02/23 1705 11/02/23 2225 11/03/23 0733  GLUCAP 167* 161* 140* 264* 216*   D-Dimer No  results for input(s): "DDIMER" in the last 72 hours. Hgb A1c No results for input(s): "HGBA1C" in the last 72 hours. Lipid Profile No results for input(s): "CHOL", "HDL", "LDLCALC", "TRIG", "CHOLHDL", "LDLDIRECT" in the last 72 hours. Thyroid function studies No results for input(s): "TSH", "T4TOTAL", "T3FREE", "THYROIDAB" in the last 72 hours.  Invalid input(s): "FREET3" Anemia work up No results for input(s): "VITAMINB12", "FOLATE", "FERRITIN", "TIBC", "IRON", "RETICCTPCT" in the last 72 hours. Urinalysis No results found for: "COLORURINE", "APPEARANCEUR", "LABSPEC", "PHURINE", "GLUCOSEU", "HGBUR", "BILIRUBINUR", "KETONESUR", "PROTEINUR", "UROBILINOGEN", "NITRITE", "LEUKOCYTESUR" Sepsis Labs Recent Labs  Lab 10/30/23 1724 10/31/23 0426 11/01/23 0426 11/02/23 0433  WBC 6.3 6.0 5.5 5.2   Microbiology Recent Results (from the past 240 hours)  Stool C-Diff Toxin Assay     Status: Abnormal   Collection Time: 10/26/23  1:30 PM   Specimen: Stool   ST  Result Value Ref Range Status   C difficile Toxins A+B, EIA Positive (A) Negative Final     Time coordinating discharge: 35 minutes  SIGNED:   Erick Blinks, DO [facility] 11/03/2023, 9:52 AM  If 7PM-7AM, please contact night-coverage [email or web address]

## 2023-11-03 NOTE — Plan of Care (Signed)
  Problem: Clinical Measurements: Goal: Ability to maintain clinical measurements within normal limits will improve Outcome: Progressing Goal: Will remain free from infection Outcome: Progressing Goal: Diagnostic test results will improve Outcome: Progressing   Problem: Nutrition: Goal: Adequate nutrition will be maintained Outcome: Progressing   Problem: Pain Management: Goal: General experience of comfort will improve Outcome: Progressing   Problem: Safety: Goal: Ability to remain free from injury will improve Outcome: Progressing   Problem: Skin Integrity: Goal: Risk for impaired skin integrity will decrease Outcome: Progressing   Problem: Coping: Goal: Ability to adjust to condition or change in health will improve Outcome: Progressing   Problem: Metabolic: Goal: Ability to maintain appropriate glucose levels will improve Outcome: Progressing   Problem: Skin Integrity: Goal: Risk for impaired skin integrity will decrease Outcome: Progressing   Problem: Tissue Perfusion: Goal: Adequacy of tissue perfusion will improve Outcome: Progressing

## 2023-11-03 NOTE — Care Management Important Message (Signed)
Important Message  Patient Details  Name: Amy Moses MRN: [id number] Date of Birth: 1971/04/11   Important Message Given:  N/A - LOS <3 / Initial given by admissions     Corey Harold 11/03/2023, 12:25 PM

## 2023-11-03 NOTE — TOC Transition Note (Signed)
Transition of Care [facility]) - Discharge Note   Patient Details  Name: Amy Moses MRN: [id number] Date of Birth: [birth year]  Transition of Care [name]) CM/SW Contact:  Leitha Bleak, RN Phone Number: 11/03/2023, 11:19 AM   Clinical Narrative:   Patient discharging home with [facility] home health for RN/PT. Clydie Braun updated with DC plan. Added to AVS.    Final next level of care: Home w Home Health Services Barriers to Discharge: Barriers Resolved   Patient Goals and CMS Choice Patient states their goals for this hospitalization and ongoing recovery are:: to go home. CMS Medicare.gov Compare Post Acute Care list provided to:: Patient Choice offered to / list presented to : Patient     Discharge Placement             Patient and family notified of of transfer: 11/03/23  Discharge Plan and Services Additional resources added to the After Visit Summary for   In-house Referral: Clinical Social Work   Post Acute Care Choice: Resumption of Svcs/PTA Provider                    HH Arranged: Charity fundraiser, PT [facility] Agency: [facility] Date [facility] Agency Contacted: 11/03/23 Time HH Agency Contacted: [phone] Representative spoke with at [facility] Agency: Clydie Braun  Social Drivers of Health (SDOH) Interventions SDOH Screenings   Food Insecurity: Food Insecurity Present (10/31/2023)  Housing: Low Risk  (11/02/2023)  Transportation Needs: Unmet Transportation Needs (10/31/2023)  Utilities: Not At Risk (10/31/2023)  Depression (PHQ2-9): Low Risk  (04/06/2022)  Tobacco Use: Low Risk  (10/30/2023)     Readmission Risk Interventions    11/03/2023   11:18 AM 11/02/2023   11:31 AM 08/07/2023   12:51 PM  Readmission Risk Prevention Plan  Transportation Screening Complete Complete Complete  PCP or Specialist Appt within 5-7 Days Complete    Home Care Screening Complete    Medication Review (RN CM) Complete    HRI or Home Care Consult  Complete Complete  Social Work Consult for Recovery  Care Planning/Counseling  Complete Complete  Palliative Care Screening  Not Applicable Not Applicable  Medication Review [employer])  Complete Complete

## 2023-11-03 NOTE — Progress Notes (Signed)
PT Cancellation Note  Patient Details Name: Amy Moses MRN: [id number] DOB: Aug 29, 1971   Cancelled Treatment:    Reason Eval/Treat Not Completed: Patient declined, no reason specified.  Patient declined due to needing bedpan, offered to assist her to [facility], patient refused.   3:53 PM, 11/03/23 [name], MPT Physical Therapist with [name] [phone] office [phone] mobile phone

## 2023-11-10 ENCOUNTER — Other Ambulatory Visit: Payer: Self-pay | Admitting: Gastroenterology

## 2023-11-10 ENCOUNTER — Telehealth: Payer: Self-pay

## 2023-11-10 DIAGNOSIS — R110 Nausea: Secondary | ICD-10-CM

## 2023-11-10 MED ORDER — ONDANSETRON 4 MG PO TBDP: 4.0000 mg | ORAL_TABLET | Freq: Every day | ORAL | 0 refills | Status: DC | PRN

## 2023-11-10 NOTE — Telephone Encounter (Signed)
Pt was made aware and verbalized understanding and would like for zofran to be sent in.

## 2023-11-10 NOTE — Telephone Encounter (Signed)
Pt called stating that the medication she was put on at the hospital was making her sick to take twice a day. It is Dificid  to take twice a day. Pt wants to know if she can just take once a day. She states that if she still takes 2 a day she wlll be finished Sunday. Please advise

## 2023-11-23 ENCOUNTER — Telehealth: Payer: Self-pay

## 2023-11-23 NOTE — Telephone Encounter (Signed)
 I called and sw Amy Moses will use adaptic and AG with 4 layer compression. States that the lateral side of the foot has opened and had pink drain no odor and questions if the wound could be tunneling. Appt for eval on Monday will call with any questions or changes. She is scheduled to see the pt tomorrow.

## 2023-11-23 NOTE — Telephone Encounter (Signed)
 [name] from [facility] called and left VM on [employer]. States she saw patient Tuesday PM and noticed a new opening/wound on right lateral foot. She pushed from bottom and saw some bloody drainage. She applied a dry dressing. She would like to know if she can add anything else on there besides a dry dressing. She sees patient twice a week. States patient has an appt scheduled to see Dr Harden on 11/27/2023.   CB [phone]

## 2023-11-27 ENCOUNTER — Ambulatory Visit: Payer: Medicare Other | Admitting: Orthopedic Surgery

## 2023-11-28 ENCOUNTER — Ambulatory Visit (INDEPENDENT_AMBULATORY_CARE_PROVIDER_SITE_OTHER): Payer: Medicare Other | Admitting: Orthopedic Surgery

## 2023-11-28 ENCOUNTER — Encounter: Payer: Self-pay | Admitting: Orthopedic Surgery

## 2023-11-28 DIAGNOSIS — L97911 Non-pressure chronic ulcer of unspecified part of right lower leg limited to breakdown of skin: Secondary | ICD-10-CM

## 2023-11-28 DIAGNOSIS — L97411 Non-pressure chronic ulcer of right heel and midfoot limited to breakdown of skin: Secondary | ICD-10-CM | POA: Diagnosis not present

## 2023-11-28 DIAGNOSIS — I890 Lymphedema, not elsewhere classified: Secondary | ICD-10-CM | POA: Diagnosis not present

## 2023-11-28 DIAGNOSIS — L97923 Non-pressure chronic ulcer of unspecified part of left lower leg with necrosis of muscle: Secondary | ICD-10-CM | POA: Diagnosis not present

## 2023-11-28 NOTE — Progress Notes (Signed)
 Office Visit Note   Patient: Amy Moses           Date of Birth: [birth year]           MRN: [id number] Visit Date: 11/28/2023              Requested by: Henriette Anes, DO [address] [place],  [place] [id number] PCP: Henriette Anes, DO  Chief Complaint  Patient presents with   Right Foot - Follow-up      HPI: Patient is a 53 year old woman who presents in follow-up for venous and lymphatic insufficiency both lower extremities.  Patient also has a history of calciphylaxis.  Patient states she has recurrent ulceration on the plantar aspect of the right foot.  Patient has home health nursing dressing changes twice a week.  Authorization for lymphedema pumps has been started but the process has not been completed.  Assessment & Plan: Visit Diagnoses:  1. Lymphedema   2. Calciphylaxis of right lower extremity with nonhealing ulcer, limited to breakdown of skin (HCC)   3. Calciphylaxis of left lower extremity with nonhealing ulcer with necrosis of muscle (HCC)   4. Ulcer of midfoot, right, limited to breakdown of skin (HCC)     Plan: Ulcer on the right foot was debrided Dynaflex wraps applied to both lower extremities.  Follow-up in 4 weeks.  Will need to restart the process for authorization for lymphedema pumps.  Follow-Up Instructions: Return in about 4 weeks (around 12/26/2023).   Ortho Exam  Patient is alert, oriented, no adenopathy, well-dressed, normal affect, normal respiratory effort. Examination the edema and swelling in both legs is improving.  There are no open ulcers on the legs.  Patient has a recurrent ulcer on the plantar aspect of the right foot.  After informed consent a 10 blade knife was used to debride the skin and soft tissue back to healthy viable bleeding granulation tissue.  The ulcer is 2 x 4 cm after debridement this was touched with silver  nitrate.  There is no tunneling no exposed bone or tendon.  Dynaflex wraps are applied to both lower  extremities.  Imaging: No results found. No images are attached to the encounter.  Labs: Lab Results  Component Value Date   HGBA1C 9.0 (H) 08/02/2023   HGBA1C 9.4 (H) 10/01/2022   HGBA1C 11.5 (H) 02/15/2022   ESRSEDRATE 7 08/03/2023   ESRSEDRATE 34 (H) 02/14/2022   ESRSEDRATE 40 (H) 08/07/2021   CRP 1.7 (H) 08/03/2023   CRP 7.0 (H) 02/14/2022   CRP 11.1 (H) 08/07/2021   REPTSTATUS 08/07/2023 FINAL 08/02/2023   GRAMSTAIN  07/08/2021    RARE WBC PRESENT,BOTH PMN AND MONONUCLEAR RARE GRAM POSITIVE RODS RARE GRAM POSITIVE COCCI IN PAIRS    CULT  08/02/2023    NO GROWTH 5 DAYS Performed at [facility], [address] [place], [place] [address]    IDOLINA CAIRO KOSERI 04/28/2022     Lab Results  Component Value Date   ALBUMIN  3.4 (L) 11/02/2023   ALBUMIN  4.1 10/30/2023   ALBUMIN  3.4 (L) 08/08/2023   PREALBUMIN 22 08/03/2023   PREALBUMIN 23 08/02/2023   PREALBUMIN 13.7 (L) 08/07/2021    Lab Results  Component Value Date   MG 2.2 08/03/2023   MG 1.9 03/24/2023   MG 2.4 10/01/2022   Lab Results  Component Value Date   VD25OH 36.9 12/30/2021    Lab Results  Component Value Date   PREALBUMIN 22 08/03/2023   PREALBUMIN 23 08/02/2023  PREALBUMIN 13.7 (L) 08/07/2021      Latest Ref Rng & Units 11/02/2023    4:33 AM 11/01/2023    4:26 AM 10/31/2023    4:26 AM  CBC EXTENDED  WBC 4.0 - 10.5 K/uL 5.2  5.5  6.0   RBC 3.87 - 5.11 MIL/uL 3.72  3.38  3.50   Hemoglobin 12.0 - 15.0 g/dL 89.1  89.7  89.4   HCT 36.0 - 46.0 % 35.3  31.9  33.1   Platelets 150 - 400 K/uL 61  57  60      There is no height or weight on file to calculate BMI.  Orders:  No orders of the defined types were placed in this encounter.  No orders of the defined types were placed in this encounter.    Procedures: No procedures performed  Clinical Data: No additional findings.  ROS:  All other systems negative, except as noted in the HPI. Review of  Systems  Objective: Vital Signs: LMP 08/22/2015 (Approximate)   Specialty Comments:  No specialty comments available.  PMFS History: Patient Active Problem List   Diagnosis Date Noted   Anemia due to chronic kidney disease 11/01/2023   Clostridioides difficile diarrhea 10/30/2023   Rash 10/30/2023   Diarrhea 10/24/2023   Non-pressure chronic ulcer of other part of right foot limited to breakdown of skin (HCC) 08/08/2023   PAD (peripheral artery disease) (HCC) 08/08/2023   Acute osteomyelitis of right foot (HCC) 08/04/2023   OSA (obstructive sleep apnea) 08/02/2023   Prolapsed internal hemorrhoids, grade 3 03/29/2023   Thrombocytopenia (HCC) 03/21/2023   Mitral regurgitation 11/16/2022   Pressure injury of skin 10/01/2022   Pulmonary hypertension, unspecified (HCC) 09/12/2022   GERD (gastroesophageal reflux disease) 04/30/2022   Non-adherence to medical treatment 03/16/2022   Cutaneous abscess of right foot    Osteomyelitis of foot (HCC) 02/14/2022   Obesity (BMI 30-39.9) 01/26/2022   Ischemic ulcer of right foot (HCC) 01/24/2022   Anemia of chronic kidney failure, stage 5 (HCC) 01/24/2022   Sacral pressure ulcer 01/24/2022   Leg wound, left, sequela 12/10/2021   Open leg wound 09/03/2021   Wound infection    Non-pressure chronic ulcer of right calf limited to breakdown of skin (HCC)    Calciphylaxis of right lower extremity with nonhealing ulcer, limited to breakdown of skin (HCC)    Chronic ulcer of left thigh (HCC) 08/07/2021   Calciphylaxis of left lower extremity with nonhealing ulcer with necrosis of muscle (HCC) 08/07/2021   Chronic diastolic heart failure (HCC) 03/30/2021   ESRD (end stage renal disease) on dialysis (HCC) 11/16/2020   Calciphylaxis 11/06/2020   Non-healing open wound of heel 11/03/2020   Diabetic foot infection (HCC) 11/01/2020   Decubitus ulcer, heel 11/01/2020   Closed nondisplaced fracture of left patella 10/29/2020   Acute on chronic renal  failure (HCC) 06/10/2020   Anemia of chronic disease 06/10/2020   Vitamin D  deficiency 01/28/2019   Type 2 diabetes mellitus with ESRD (end-stage renal disease) (HCC) 09/21/2015   Mixed hyperlipidemia 09/21/2015   Primary hypertension 09/21/2015   Acquired hypothyroidism 09/21/2015   Iris bomb 07/31/2012   Secondary angle-closure glaucoma 07/31/2012   Past Medical History:  Diagnosis Date   Anemia    Blindness of right eye with low vision in contralateral eye    s/p victrectomy   Chronic diastolic heart failure (HCC) 03/30/2021   Diabetes mellitus, type II (HCC)    Dyslipidemia    Glaucoma    History of blood  transfusion    Hypertension    Hypothyroidism (acquired)    Kidney disease    Stage 5   Mitral regurgitation 11/16/2022   Pneumonia    Pulmonary hypertension, unspecified (HCC) 09/12/2022    Family History  Problem Relation Age of Onset   Heart failure Mother    Heart disease Mother    Diabetes Mother    Kidney disease Mother    Heart failure Father    Diabetes Father    Heart disease Father    Diabetes Brother    Heart failure Maternal Grandmother    Heart failure Maternal Grandfather    Transient ischemic attack Maternal Grandfather    Colon cancer Neg Hx     Past Surgical History:  Procedure Laterality Date   ABDOMINAL AORTOGRAM W/LOWER EXTREMITY Bilateral 12/18/2020   Procedure: ABDOMINAL AORTOGRAM W/LOWER EXTREMITY;  Surgeon: Harvey Carlin BRAVO, MD;  [facility];  Service: Cardiovascular;  Laterality: Bilateral;   ABDOMINAL AORTOGRAM W/LOWER EXTREMITY Bilateral 01/25/2022   Procedure: ABDOMINAL AORTOGRAM W/LOWER EXTREMITY;  Surgeon: Serene Gaile ORN, MD;  [facility]: [facility];  Service: Cardiovascular;  Laterality: Bilateral;   ABDOMINAL AORTOGRAM W/LOWER EXTREMITY Right 08/04/2023   Procedure: ABDOMINAL AORTOGRAM W/LOWER EXTREMITY;  Surgeon: Magda Debby SAILOR, MD;  [facility]: [facility];  Service: Cardiovascular;  Laterality:  Right;   AMPUTATION Right 02/16/2022   Procedure: RIGHT FOOT 5TH RAY AMPUTATION;  Surgeon: Harden Jerona GAILS, MD;  Location: [facility] OR;  Service: Orthopedics;  Laterality: Right;   ANKLE FRACTURE SURGERY Right    AV FISTULA PLACEMENT Left 08/18/2020   Procedure: LEFT ARM BRACHIOCEPHALIC ARTERIOVENOUS (AV) FISTULA CREATION;  Surgeon: Harvey Carlin BRAVO, MD;  Location: [name] OR;  Service: Vascular;  Laterality: Left;   BIOPSY  04/24/2021   Procedure: BIOPSY;  Surgeon: Cindie Carlin POUR, DO;  [facility]: [facility];  Service: Endoscopy;;   CESAREAN SECTION     CHOLECYSTECTOMY     COLONOSCOPY  04/24/2021   Surgeon: Cindie Carlin POUR, DO;  nonbleeding internal hemorrhoids, 1 large (25 mm) pedunculated transverse colon polyp (prolapse type polyp) with adherent clot and stigmata of recent bleed.   COLONOSCOPY WITH PROPOFOL  N/A 05/14/2021   Procedure: COLONOSCOPY WITH PROPOFOL ;  Surgeon: Shaaron Lamar HERO, MD;  [facility];  Service: Endoscopy;  Laterality: N/A;   COLONOSCOPY WITH PROPOFOL  N/A 03/17/2023   Procedure: COLONOSCOPY WITH PROPOFOL ;  Surgeon: Shaaron Lamar HERO, MD;  [facility];  Service: Endoscopy;  Laterality: N/A;   ESOPHAGOGASTRODUODENOSCOPY (EGD) WITH PROPOFOL  N/A 04/24/2021   Surgeon: Cindie Carlin POUR, DO;  duodenal erosions and gastritis biopsied (pathology with peptic duodenitis, reactive gastropathy with erosions/chronic inflammation, negative for H. pylori)   EYE SURGERY     Vatrectomy   HEMOSTASIS CLIP PLACEMENT  05/14/2021   Procedure: HEMOSTASIS CLIP PLACEMENT;  Surgeon: Shaaron Lamar HERO, MD;  [facility];  Service: Endoscopy;;   IR PERC TUN PERIT CATH WO PORT S&I /IMAG  09/15/2020   IR REMOVAL TUN CV CATH W/O FL  02/19/2021   IR US  GUIDE VASC ACCESS RIGHT  09/15/2020   POLYPECTOMY  04/24/2021   Procedure: POLYPECTOMY;  Surgeon: Cindie Carlin POUR, DO;  [facility];  Service: Endoscopy;;   POLYPECTOMY  05/14/2021   Procedure: POLYPECTOMY;   Surgeon: Shaaron Lamar HERO, MD;  [facility];  Service: Endoscopy;;   POLYPECTOMY  03/17/2023   Procedure: POLYPECTOMY;  Surgeon: Shaaron Lamar HERO, MD;  [facility];  Service: Endoscopy;;   SKIN SPLIT GRAFT Bilateral 09/03/2021   Procedure: SKIN GRAFT BILATERAL LEGS;  Surgeon: Harden Jerona GAILS, MD;  Location: [facility] OR;  Service: Orthopedics;  Laterality: Bilateral;   SKIN SPLIT GRAFT Left 12/10/2021   Procedure: IRRIGATION AND DEBRIDEMENT LEFT CALF, APPLICATION SPLIT THICKNESS SKIN GRAFT;  Surgeon: Harden Jerona GAILS, MD;  Location: [facility];  Service: Orthopedics;  Laterality: Left;   TOE SURGERY     Social History   Occupational History   Not on file  Tobacco Use   Smoking status: Never   Smokeless tobacco: Never  Vaping Use   Vaping status: Never Used  Substance and Sexual Activity   Alcohol use: No   Drug use: No   Sexual activity: Yes    Birth control/protection: Condom

## 2023-12-04 ENCOUNTER — Ambulatory Visit: Payer: Medicare Other | Admitting: Orthopedic Surgery

## 2023-12-25 ENCOUNTER — Ambulatory Visit (INDEPENDENT_AMBULATORY_CARE_PROVIDER_SITE_OTHER): Payer: Medicare Other | Admitting: Orthopedic Surgery

## 2023-12-25 ENCOUNTER — Encounter: Payer: Self-pay | Admitting: Orthopedic Surgery

## 2023-12-25 DIAGNOSIS — I739 Peripheral vascular disease, unspecified: Secondary | ICD-10-CM

## 2023-12-25 DIAGNOSIS — L97911 Non-pressure chronic ulcer of unspecified part of right lower leg limited to breakdown of skin: Secondary | ICD-10-CM | POA: Diagnosis not present

## 2023-12-25 DIAGNOSIS — L97923 Non-pressure chronic ulcer of unspecified part of left lower leg with necrosis of muscle: Secondary | ICD-10-CM

## 2023-12-25 DIAGNOSIS — I890 Lymphedema, not elsewhere classified: Secondary | ICD-10-CM

## 2023-12-25 NOTE — Progress Notes (Signed)
Office Visit Note   Patient: Amy Moses           Date of Birth: 1970-11-30           MRN: [id number] Visit Date: 12/25/2023              Requested by: Lorelei Pont, DO [address] [place],  [place] [address] PCP: Lorelei Pont, DO  Chief Complaint  Patient presents with   Right Leg - Follow-up   Left Leg - Follow-up      HPI: Patient is a 53 year old woman who is seen in follow-up for bilateral venous insufficiency.  Patient has been undergoing serial compression wraps twice a week at home with home health nursing.  Measurements were obtained last visit for evaluation for lymphedema pumps.  Assessment & Plan: Visit Diagnoses:  1. Lymphedema   2. Calciphylaxis of right lower extremity with nonhealing ulcer, limited to breakdown of skin (HCC)   3. Calciphylaxis of left lower extremity with nonhealing ulcer with necrosis of muscle (HCC)   4. PVD (peripheral vascular disease) (HCC)     Plan: Dynaflex wraps applied x 2.  Measurements made to proceed with authorization for lymphedema pumps.  Follow-Up Instructions: Return in about 4 weeks (around 01/22/2024).   Ortho Exam  Patient is alert, oriented, no adenopathy, well-dressed, normal affect, normal respiratory effort. Examination of both legs patient has improved decreased swelling there is brawny skin color changes and papillomata's changes.  There is an open ulcer on the base of the right foot fifth metatarsal.  This is superficial with fibrinous tissue no tunneling no cellulitis no exposed bone or tendon.  Examination of the right leg the the right ankle is 22 cm in circumference left ankle 23 cm in circumference.  Right calf 37 cm in circumference left calf 32 cm in circumference.  Right knee 66 cm in circumference left knee 57 cm in circumference.  Right thigh 66 cm in circumference left thigh 59 cm in circumference.  Examination patient does have hyperkeratosis hyperpigmentation and 1 venous ulcer on the right lower  extremity.  Patient has undergone conservative treatment including elevation and serial compression.  Patient has completed decongestive therapy.  Patient has tried the basic pneumatic compression device set at 30 mm of compression failed to decrease the edema in her thigh and abdomen while using 1 hour a day.  Original measurements show right ankle circumference 27 cm left ankle circumference 25 cm.  Right calf 52 cm left calf 33 cm.  Right knee 57 cm and left knee 51 cm.  Right thigh 66 cm left thigh 59 cm.  Suprapubic measurement 96 cm.  Imaging: No results found. No images are attached to the encounter.  Labs: Lab Results  Component Value Date   HGBA1C 9.0 (H) 08/02/2023   HGBA1C 9.4 (H) 10/01/2022   HGBA1C 11.5 (H) 02/15/2022   ESRSEDRATE 7 08/03/2023   ESRSEDRATE 34 (H) 02/14/2022   ESRSEDRATE 40 (H) 08/07/2021   CRP 1.7 (H) 08/03/2023   CRP 7.0 (H) 02/14/2022   CRP 11.1 (H) 08/07/2021   REPTSTATUS 08/07/2023 FINAL 08/02/2023   GRAMSTAIN  07/08/2021    RARE WBC PRESENT,BOTH PMN AND MONONUCLEAR RARE GRAM POSITIVE RODS RARE GRAM POSITIVE COCCI IN PAIRS    CULT  08/02/2023    NO GROWTH 5 DAYS Performed at [facility], [address], [place], [place] [address]    Cindie Crumbly KOSERI 04/28/2022     Lab Results  Component Value Date  ALBUMIN 3.4 (L) 11/02/2023   ALBUMIN 4.1 10/30/2023   ALBUMIN 3.4 (L) 08/08/2023   PREALBUMIN 22 08/03/2023   PREALBUMIN 23 08/02/2023   PREALBUMIN 13.7 (L) 08/07/2021    Lab Results  Component Value Date   MG 2.2 08/03/2023   MG 1.9 03/24/2023   MG 2.4 10/01/2022   Lab Results  Component Value Date   VD25OH 36.9 12/30/2021    Lab Results  Component Value Date   PREALBUMIN 22 08/03/2023   PREALBUMIN 23 08/02/2023   PREALBUMIN 13.7 (L) 08/07/2021      Latest Ref Rng & Units 11/02/2023    4:33 AM 11/01/2023    4:26 AM 10/31/2023    4:26 AM  CBC EXTENDED  WBC 4.0 - 10.5 K/uL 5.2  5.5  6.0   RBC 3.87 - 5.11  MIL/uL 3.72  3.38  3.50   Hemoglobin 12.0 - 15.0 g/dL 82.9  56.2  13.0   HCT 36.0 - 46.0 % 35.3  31.9  33.1   Platelets 150 - 400 K/uL 61  57  60      There is no height or weight on file to calculate BMI.  Orders:  No orders of the defined types were placed in this encounter.  No orders of the defined types were placed in this encounter.    Procedures: No procedures performed  Clinical Data: No additional findings.  ROS:  All other systems negative, except as noted in the HPI. Review of Systems  Objective: Vital Signs: LMP 08/22/2015 (Approximate)   Specialty Comments:  No specialty comments available.  PMFS History: Patient Active Problem List   Diagnosis Date Noted   Anemia due to chronic kidney disease 11/01/2023   Clostridioides difficile diarrhea 10/30/2023   Rash 10/30/2023   Diarrhea 10/24/2023   Non-pressure chronic ulcer of other part of right foot limited to breakdown of skin (HCC) 08/08/2023   PAD (peripheral artery disease) (HCC) 08/08/2023   Acute osteomyelitis of right foot (HCC) 08/04/2023   OSA (obstructive sleep apnea) 08/02/2023   Prolapsed internal hemorrhoids, grade 3 03/29/2023   Thrombocytopenia (HCC) 03/21/2023   Mitral regurgitation 11/16/2022   Pressure injury of skin 10/01/2022   Pulmonary hypertension, unspecified (HCC) 09/12/2022   GERD (gastroesophageal reflux disease) 04/30/2022   Non-adherence to medical treatment 03/16/2022   Cutaneous abscess of right foot    Osteomyelitis of foot (HCC) 02/14/2022   Obesity (BMI 30-39.9) 01/26/2022   Ischemic ulcer of right foot (HCC) 01/24/2022   Anemia of chronic kidney failure, stage 5 (HCC) 01/24/2022   Sacral pressure ulcer 01/24/2022   Leg wound, left, sequela 12/10/2021   Open leg wound 09/03/2021   Wound infection    Non-pressure chronic ulcer of right calf limited to breakdown of skin (HCC)    Calciphylaxis of right lower extremity with nonhealing ulcer, limited to breakdown of  skin (HCC)    Chronic ulcer of left thigh (HCC) 08/07/2021   Calciphylaxis of left lower extremity with nonhealing ulcer with necrosis of muscle (HCC) 08/07/2021   Chronic diastolic heart failure (HCC) 03/30/2021   ESRD (end stage renal disease) on dialysis (HCC) 11/16/2020   Calciphylaxis 11/06/2020   Non-healing open wound of heel 11/03/2020   Diabetic foot infection (HCC) 11/01/2020   Decubitus ulcer, heel 11/01/2020   Closed nondisplaced fracture of left patella 10/29/2020   Acute on chronic renal failure (HCC) 06/10/2020   Anemia of chronic disease 06/10/2020   Vitamin D deficiency 01/28/2019   Type 2 diabetes mellitus with ESRD (  end-stage renal disease) (HCC) 09/21/2015   Mixed hyperlipidemia 09/21/2015   Primary hypertension 09/21/2015   Acquired hypothyroidism 09/21/2015   Iris bomb 07/31/2012   Secondary angle-closure glaucoma 07/31/2012   Past Medical History:  Diagnosis Date   Anemia    Blindness of right eye with low vision in contralateral eye    s/p victrectomy   Chronic diastolic heart failure (HCC) 03/30/2021   Diabetes mellitus, type II (HCC)    Dyslipidemia    Glaucoma    History of blood transfusion    Hypertension    Hypothyroidism (acquired)    Kidney disease    Stage 5   Mitral regurgitation 11/16/2022   Pneumonia    Pulmonary hypertension, unspecified (HCC) 09/12/2022    Family History  Problem Relation Age of Onset   Heart failure Mother    Heart disease Mother    Diabetes Mother    Kidney disease Mother    Heart failure Father    Diabetes Father    Heart disease Father    Diabetes Brother    Heart failure Maternal Grandmother    Heart failure Maternal Grandfather    Transient ischemic attack Maternal Grandfather    Colon cancer Neg Hx     Past Surgical History:  Procedure Laterality Date   ABDOMINAL AORTOGRAM W/LOWER EXTREMITY Bilateral 12/18/2020   Procedure: ABDOMINAL AORTOGRAM W/LOWER EXTREMITY;  Surgeon: Sherren Kerns, MD;   [facility];  Service: Cardiovascular;  Laterality: Bilateral;   ABDOMINAL AORTOGRAM W/LOWER EXTREMITY Bilateral 01/25/2022   Procedure: ABDOMINAL AORTOGRAM W/LOWER EXTREMITY;  Surgeon: Nada Libman, MD;  [facility]: [facility];  Service: Cardiovascular;  Laterality: Bilateral;   ABDOMINAL AORTOGRAM W/LOWER EXTREMITY Right 08/04/2023   Procedure: ABDOMINAL AORTOGRAM W/LOWER EXTREMITY;  Surgeon: Leonie Douglas, MD;  [facility];  Service: Cardiovascular;  Laterality: Right;   AMPUTATION Right 02/16/2022   Procedure: RIGHT FOOT 5TH RAY AMPUTATION;  Surgeon: Nadara Mustard, MD;  Location: [facility] OR;  Service: Orthopedics;  Laterality: Right;   ANKLE FRACTURE SURGERY Right    AV FISTULA PLACEMENT Left 08/18/2020   Procedure: LEFT ARM BRACHIOCEPHALIC ARTERIOVENOUS (AV) FISTULA CREATION;  Surgeon: Sherren Kerns, MD;  Location: [facility] OR;  Service: Vascular;  Laterality: Left;   BIOPSY  04/24/2021   Procedure: BIOPSY;  Surgeon: Lanelle Bal, DO;  [facility];  Service: Endoscopy;;   CESAREAN SECTION     CHOLECYSTECTOMY     COLONOSCOPY  04/24/2021   Surgeon: Lanelle Bal, DO;  nonbleeding internal hemorrhoids, 1 large (25 mm) pedunculated transverse colon polyp (prolapse type polyp) with adherent clot and stigmata of recent bleed.   COLONOSCOPY WITH PROPOFOL N/A 05/14/2021   Procedure: COLONOSCOPY WITH PROPOFOL;  Surgeon: Corbin Ade, MD;  [facility];  Service: Endoscopy;  Laterality: N/A;   COLONOSCOPY WITH PROPOFOL N/A 03/17/2023   Procedure: COLONOSCOPY WITH PROPOFOL;  Surgeon: Corbin Ade, MD;  [facility];  Service: Endoscopy;  Laterality: N/A;   ESOPHAGOGASTRODUODENOSCOPY (EGD) WITH PROPOFOL N/A 04/24/2021   Surgeon: Lanelle Bal, DO;  duodenal erosions and gastritis biopsied (pathology with peptic duodenitis, reactive gastropathy with erosions/chronic inflammation, negative for H. pylori)   EYE  SURGERY     Vatrectomy   HEMOSTASIS CLIP PLACEMENT  05/14/2021   Procedure: HEMOSTASIS CLIP PLACEMENT;  Surgeon: Corbin Ade, MD;  [facility];  Service: Endoscopy;;   IR PERC TUN PERIT CATH WO PORT S&I Judi Cong  09/15/2020   IR REMOVAL TUN CV CATH W/O FL  02/19/2021   IR US GUIDE VASC ACCESS RIGHT  09/15/2020   POLYPECTOMY  04/24/2021   Procedure: POLYPECTOMY;  Surgeon: Lanelle Bal, DO;  [facility];  Service: Endoscopy;;   POLYPECTOMY  05/14/2021   Procedure: POLYPECTOMY;  Surgeon: Corbin Ade, MD;  [facility];  Service: Endoscopy;;   POLYPECTOMY  03/17/2023   Procedure: POLYPECTOMY;  Surgeon: Corbin Ade, MD;  [facility];  Service: Endoscopy;;   SKIN SPLIT GRAFT Bilateral 09/03/2021   Procedure: SKIN GRAFT BILATERAL LEGS;  Surgeon: Nadara Mustard, MD;  Location: [facility] OR;  Service: Orthopedics;  Laterality: Bilateral;   SKIN SPLIT GRAFT Left 12/10/2021   Procedure: IRRIGATION AND DEBRIDEMENT LEFT CALF, APPLICATION SPLIT THICKNESS SKIN GRAFT;  Surgeon: Nadara Mustard, MD;  Location: [facility];  Service: Orthopedics;  Laterality: Left;   TOE SURGERY     Social History   Occupational History   Not on file  Tobacco Use   Smoking status: Never   Smokeless tobacco: Never  Vaping Use   Vaping status: Never Used  Substance and Sexual Activity   Alcohol use: No   Drug use: No   Sexual activity: Yes    Birth control/protection: Condom

## 2024-01-02 ENCOUNTER — Ambulatory Visit: Payer: Medicare Other | Admitting: Internal Medicine

## 2024-01-07 NOTE — Progress Notes (Deleted)
  Cardiology Office Note:  .   Date:  01/07/2024  ID:  Amy Moses, DOB [birth year], MRN [id number] PCP: Lorelei Pont, DO  [facility] Providers Cardiologist:  Chilton Si, MD { Click to update primary MD,subspecialty MD or APP then REFRESH:1}   History of Present Illness: .   Amy Moses is a 53 y.o. female with history of diabetes, hypertension, hyperlipidemia, pulmonary hypertension, ESRD on HD, right eye blindness, hypothyroidism, OSA.   Admitted 04/2022 with bacteremia treated with 4 weeks of antibiotics.  Echo at that time LVEF 45 to 50%, global hypokinesis, grade 2 diastolic dysfunction, PASP 54 mmHg.  Repeat echo in the outpatient setting 07/2022 LVEF 50 to 55%, indeterminate diastolic dysfunction, PASP 64 mmHg, RA pressure 8.  Lexiscan Myoview 07/2022 LVEF 60%, fixed defect in mid to basal inferior and inferolateral region felt to be due to diaphragmatic activity, overall low risk study.   At visit 08/2022 she had episodes of symptomatic hypotension particularly during dialysis.  Midodrine was ordered to utilize prior to dialysis.  Autoimmune workup was unremarkable.  PFTs were abnormal and referred to pulmonology.  Hospitalized 09/2022 at [facility] with GI bleed.  Admitted 02/23/23 - 03/17/2023 with rectal bleeding concerning for lower GI bleed with colonoscopy revealing prominent hemorrhoids as well as hemorrhagic ascending colonic polyp.  Readmitted 4/29 - 03/26/2023 with lower GI bleed due to internal hemorrhoids with plan for possible outpatient hemorrhoid banding.    Admitted 07/2023 for ischemic ulcer of right foot requiring orthopedic and ID input. Underwent left common femoral access for RLE angiogram with inline flow.   Admitted 12/9-12/13/24 with C.dificile requiring IV abx.   Presents today for follow up. ***  ROS: ***  Studies Reviewed: .        *** Risk Assessment/Calculations:   {Does this patient have ATRIAL FIBRILLATION?:[phone]} No BP recorded.   {Refresh Note OR Click here to enter BP  :1}***       Physical Exam:   VS:  LMP 08/22/2015 (Approximate)    Wt Readings from Last 3 Encounters:  11/02/23 201 lb 8 oz (91.4 kg)  10/24/23 205 lb (93 kg)  07/20/23 210 lb (95.3 kg)    GEN: Well nourished, well developed in no acute distress NECK: No JVD; No carotid bruits CARDIAC: ***RRR, no murmurs, rubs, gallops RESPIRATORY:  Clear to auscultation without rales, wheezing or rhonchi  ABDOMEN: Soft, non-tender, non-distended EXTREMITIES:  No edema; No deformity   ASSESSMENT AND PLAN: .    Chronic diastolic heart failure- Volume management per HD. No new orthopnea, PND, dyspnea. BP and ESRD preclude heart failure medications. Low sodium diet, fluid restriction <2L, and daily weights encouraged. Educated to contact our office for weight gain of 2 lbs overnight or 5 lbs in one week.    ESRD on HD - Follows with nephrology. No significant hypotension with HD. Using Midodrine 10mg  daily. ***   Pulmonary hypertension- Suspected due to MR and diastolic dysfunction. Follows with Dr. Kendrick Fries. ***   Mitral regurgitation-per Dr. Duke Salvia previous notes not good surgical candidate and anatomy challenging for Mitraclip. No new heart failure symptoms.***   OSA - Endorses compliance with CPAP. ***   Hyperlipidemia- Continue Atorvastatin. ***    {Are you ordering a CV Procedure (e.g. stress test, cath, DCCV, TEE, etc)?   Press F2        :[id number]}  Dispo: ***  Signed, Alver Sorrow, NP

## 2024-01-08 ENCOUNTER — Ambulatory Visit (HOSPITAL_BASED_OUTPATIENT_CLINIC_OR_DEPARTMENT_OTHER): Payer: Medicare Other | Admitting: Family

## 2024-01-13 ENCOUNTER — Other Ambulatory Visit: Payer: Self-pay

## 2024-01-13 ENCOUNTER — Inpatient Hospital Stay (HOSPITAL_COMMUNITY): Payer: Medicare Other

## 2024-01-13 ENCOUNTER — Encounter (HOSPITAL_COMMUNITY): Payer: Self-pay | Admitting: *Deleted

## 2024-01-13 ENCOUNTER — Inpatient Hospital Stay (HOSPITAL_COMMUNITY)
Admission: EM | Admit: 2024-01-13 | Discharge: 2024-01-19 | DRG: 637 | Disposition: A | Discharge status: Home Health | Payer: Medicare Other | Attending: Internal Medicine | Admitting: Internal Medicine

## 2024-01-13 ENCOUNTER — Emergency Department (HOSPITAL_COMMUNITY): Payer: Medicare Other

## 2024-01-13 DIAGNOSIS — N2581 Secondary hyperparathyroidism of renal origin: Secondary | ICD-10-CM | POA: Diagnosis present

## 2024-01-13 DIAGNOSIS — L97519 Non-pressure chronic ulcer of other part of right foot with unspecified severity: Principal | ICD-10-CM

## 2024-01-13 DIAGNOSIS — L97509 Non-pressure chronic ulcer of other part of unspecified foot with unspecified severity: Secondary | ICD-10-CM

## 2024-01-13 DIAGNOSIS — M86671 Other chronic osteomyelitis, right ankle and foot: Secondary | ICD-10-CM | POA: Diagnosis present

## 2024-01-13 DIAGNOSIS — Z833 Family history of diabetes mellitus: Secondary | ICD-10-CM

## 2024-01-13 DIAGNOSIS — Z79899 Other long term (current) drug therapy: Secondary | ICD-10-CM | POA: Diagnosis not present

## 2024-01-13 DIAGNOSIS — E66811 Obesity, class 1: Secondary | ICD-10-CM | POA: Diagnosis present

## 2024-01-13 DIAGNOSIS — I272 Pulmonary hypertension, unspecified: Secondary | ICD-10-CM | POA: Diagnosis present

## 2024-01-13 DIAGNOSIS — Z79890 Hormone replacement therapy (postmenopausal): Secondary | ICD-10-CM | POA: Diagnosis not present

## 2024-01-13 DIAGNOSIS — Z841 Family history of disorders of kidney and ureter: Secondary | ICD-10-CM

## 2024-01-13 DIAGNOSIS — Z992 Dependence on renal dialysis: Secondary | ICD-10-CM | POA: Diagnosis not present

## 2024-01-13 DIAGNOSIS — L97409 Non-pressure chronic ulcer of unspecified heel and midfoot with unspecified severity: Secondary | ICD-10-CM

## 2024-01-13 DIAGNOSIS — Z8249 Family history of ischemic heart disease and other diseases of the circulatory system: Secondary | ICD-10-CM | POA: Diagnosis not present

## 2024-01-13 DIAGNOSIS — E871 Hypo-osmolality and hyponatremia: Secondary | ICD-10-CM | POA: Diagnosis not present

## 2024-01-13 DIAGNOSIS — A0471 Enterocolitis due to Clostridium difficile, recurrent: Secondary | ICD-10-CM | POA: Diagnosis present

## 2024-01-13 DIAGNOSIS — A0472 Enterocolitis due to Clostridium difficile, not specified as recurrent: Secondary | ICD-10-CM | POA: Diagnosis not present

## 2024-01-13 DIAGNOSIS — E782 Mixed hyperlipidemia: Secondary | ICD-10-CM | POA: Diagnosis present

## 2024-01-13 DIAGNOSIS — L97511 Non-pressure chronic ulcer of other part of right foot limited to breakdown of skin: Secondary | ICD-10-CM | POA: Diagnosis not present

## 2024-01-13 DIAGNOSIS — E11621 Type 2 diabetes mellitus with foot ulcer: Secondary | ICD-10-CM | POA: Diagnosis present

## 2024-01-13 DIAGNOSIS — N186 End stage renal disease: Secondary | ICD-10-CM | POA: Diagnosis present

## 2024-01-13 DIAGNOSIS — I132 Hypertensive heart and chronic kidney disease with heart failure and with stage 5 chronic kidney disease, or end stage renal disease: Secondary | ICD-10-CM | POA: Diagnosis present

## 2024-01-13 DIAGNOSIS — I5032 Chronic diastolic (congestive) heart failure: Secondary | ICD-10-CM | POA: Diagnosis present

## 2024-01-13 DIAGNOSIS — E1169 Type 2 diabetes mellitus with other specified complication: Secondary | ICD-10-CM | POA: Diagnosis present

## 2024-01-13 DIAGNOSIS — Z794 Long term (current) use of insulin: Secondary | ICD-10-CM

## 2024-01-13 DIAGNOSIS — K219 Gastro-esophageal reflux disease without esophagitis: Secondary | ICD-10-CM | POA: Diagnosis present

## 2024-01-13 DIAGNOSIS — L97529 Non-pressure chronic ulcer of other part of left foot with unspecified severity: Secondary | ICD-10-CM | POA: Diagnosis present

## 2024-01-13 DIAGNOSIS — I959 Hypotension, unspecified: Secondary | ICD-10-CM

## 2024-01-13 DIAGNOSIS — Z6832 Body mass index (BMI) 32.0-32.9, adult: Secondary | ICD-10-CM

## 2024-01-13 DIAGNOSIS — M869 Osteomyelitis, unspecified: Secondary | ICD-10-CM | POA: Diagnosis present

## 2024-01-13 DIAGNOSIS — M25511 Pain in right shoulder: Secondary | ICD-10-CM | POA: Diagnosis present

## 2024-01-13 DIAGNOSIS — L97419 Non-pressure chronic ulcer of right heel and midfoot with unspecified severity: Secondary | ICD-10-CM | POA: Diagnosis present

## 2024-01-13 DIAGNOSIS — G4733 Obstructive sleep apnea (adult) (pediatric): Secondary | ICD-10-CM | POA: Diagnosis present

## 2024-01-13 DIAGNOSIS — L97411 Non-pressure chronic ulcer of right heel and midfoot limited to breakdown of skin: Secondary | ICD-10-CM

## 2024-01-13 DIAGNOSIS — E039 Hypothyroidism, unspecified: Secondary | ICD-10-CM | POA: Diagnosis present

## 2024-01-13 DIAGNOSIS — L89153 Pressure ulcer of sacral region, stage 3: Secondary | ICD-10-CM | POA: Diagnosis present

## 2024-01-13 DIAGNOSIS — E1122 Type 2 diabetes mellitus with diabetic chronic kidney disease: Secondary | ICD-10-CM | POA: Diagnosis present

## 2024-01-13 DIAGNOSIS — M25519 Pain in unspecified shoulder: Secondary | ICD-10-CM

## 2024-01-13 DIAGNOSIS — E861 Hypovolemia: Secondary | ICD-10-CM | POA: Diagnosis not present

## 2024-01-13 DIAGNOSIS — Z5982 Transportation insecurity: Secondary | ICD-10-CM

## 2024-01-13 DIAGNOSIS — I9589 Other hypotension: Secondary | ICD-10-CM | POA: Diagnosis present

## 2024-01-13 DIAGNOSIS — D631 Anemia in chronic kidney disease: Secondary | ICD-10-CM | POA: Diagnosis present

## 2024-01-13 DIAGNOSIS — M86271 Subacute osteomyelitis, right ankle and foot: Secondary | ICD-10-CM | POA: Diagnosis not present

## 2024-01-13 DIAGNOSIS — I890 Lymphedema, not elsewhere classified: Secondary | ICD-10-CM | POA: Diagnosis present

## 2024-01-13 DIAGNOSIS — E875 Hyperkalemia: Secondary | ICD-10-CM | POA: Diagnosis present

## 2024-01-13 HISTORY — DX: Dependence on renal dialysis: N18.6

## 2024-01-13 HISTORY — DX: End stage renal disease: Z99.2

## 2024-01-13 LAB — COMPREHENSIVE METABOLIC PANEL
ALT: 11 U/L (ref 0–44)
AST: 11 U/L — ABNORMAL LOW (ref 15–41)
Albumin: 3.6 g/dL (ref 3.5–5.0)
Alkaline Phosphatase: 116 U/L (ref 38–126)
Anion gap: 20 — ABNORMAL HIGH (ref 5–15)
BUN: 50 mg/dL — ABNORMAL HIGH (ref 6–20)
CO2: 21 mmol/L — ABNORMAL LOW (ref 22–32)
Calcium: 9.4 mg/dL (ref 8.9–10.3)
Chloride: 99 mmol/L (ref 98–111)
Creatinine, Ser: 9.41 mg/dL — ABNORMAL HIGH (ref 0.44–1.00)
GFR, Estimated: 5 mL/min — ABNORMAL LOW (ref >60)
Glucose, Bld: 124 mg/dL — ABNORMAL HIGH (ref 70–99)
Potassium: 5.5 mmol/L — ABNORMAL HIGH (ref 3.5–5.1)
Sodium: 140 mmol/L (ref 135–145)
Total Bilirubin: 0.8 mg/dL (ref 0.0–1.2)
Total Protein: 6.9 g/dL (ref 6.5–8.1)

## 2024-01-13 LAB — CBC
HCT: 33.4 % — ABNORMAL LOW (ref 36.0–46.0)
Hemoglobin: 10.5 g/dL — ABNORMAL LOW (ref 12.0–15.0)
MCH: 29.2 pg (ref 26.0–34.0)
MCHC: 31.4 g/dL (ref 30.0–36.0)
MCV: 92.8 fL (ref 80.0–100.0)
Platelets: 151 10*3/uL (ref 150–400)
RBC: 3.6 MIL/uL — ABNORMAL LOW (ref 3.87–5.11)
RDW: 14.1 % (ref 11.5–15.5)
WBC: 8.5 10*3/uL (ref 4.0–10.5)
nRBC: 0 % (ref 0.0–0.2)

## 2024-01-13 LAB — TROPONIN I (HIGH SENSITIVITY): Troponin I (High Sensitivity): 18 ng/L — ABNORMAL HIGH (ref <18)

## 2024-01-13 LAB — CBG MONITORING, ED
Glucose-Capillary: 113 mg/dL — ABNORMAL HIGH (ref 70–99)
Glucose-Capillary: 188 mg/dL — ABNORMAL HIGH (ref 70–99)

## 2024-01-13 LAB — LACTIC ACID, PLASMA: Lactic Acid, Venous: 0.9 mmol/L (ref 0.5–1.9)

## 2024-01-13 LAB — HEPATITIS B SURFACE ANTIGEN: Hepatitis B Surface Ag: NONREACTIVE

## 2024-01-13 MED ORDER — ANTICOAGULANT SODIUM CITRATE 4% (200MG/5ML) IV SOLN
5.0000 mL | Status: DC | PRN
  Filled 2024-01-13: qty 5

## 2024-01-13 MED ORDER — ACETAMINOPHEN 325 MG PO TABS: 650.0000 mg | ORAL_TABLET | Freq: Four times a day (QID) | ORAL | Status: DC | PRN

## 2024-01-13 MED ORDER — LIDOCAINE HCL (PF) 1 % IJ SOLN: 5.0000 mL | INTRAMUSCULAR | Status: DC | PRN

## 2024-01-13 MED ORDER — SODIUM CHLORIDE 0.9% FLUSH
3.0000 mL | Freq: Two times a day (BID) | INTRAVENOUS | Status: DC
  Administered 2024-01-13 – 2024-01-19 (×12): 3 mL via INTRAVENOUS

## 2024-01-13 MED ORDER — INSULIN ASPART 100 UNIT/ML IJ SOLN
0.0000 [IU] | Freq: Three times a day (TID) | INTRAMUSCULAR | Status: DC
  Administered 2024-01-13: 2 [IU] via SUBCUTANEOUS
  Administered 2024-01-14: 1 [IU] via SUBCUTANEOUS
  Administered 2024-01-14: 3 [IU] via SUBCUTANEOUS
  Administered 2024-01-15 (×2): 2 [IU] via SUBCUTANEOUS
  Administered 2024-01-15: 1 [IU] via SUBCUTANEOUS
  Administered 2024-01-16 – 2024-01-19 (×5): 2 [IU] via SUBCUTANEOUS

## 2024-01-13 MED ORDER — VANCOMYCIN HCL 1750 MG/350ML IV SOLN
1750.0000 mg | Freq: Once | INTRAVENOUS | Status: AC
  Administered 2024-01-13: 1750 mg via INTRAVENOUS
  Filled 2024-01-13: qty 350

## 2024-01-13 MED ORDER — POLYETHYLENE GLYCOL 3350 17 G PO PACK: 17.0000 g | PACK | Freq: Every day | ORAL | Status: DC | PRN

## 2024-01-13 MED ORDER — NEPRO/CARBSTEADY PO LIQD
237.0000 mL | ORAL | Status: DC | PRN
  Filled 2024-01-13: qty 237

## 2024-01-13 MED ORDER — ALTEPLASE 2 MG IJ SOLR: 2.0000 mg | Freq: Once | INTRAMUSCULAR | Status: DC | PRN

## 2024-01-13 MED ORDER — PENTAFLUOROPROP-TETRAFLUOROETH EX AERO: 1.0000 | INHALATION_SPRAY | CUTANEOUS | Status: DC | PRN

## 2024-01-13 MED ORDER — VANCOMYCIN HCL IN DEXTROSE 1-5 GM/200ML-% IV SOLN
1000.0000 mg | Freq: Once | INTRAVENOUS | Status: AC
  Administered 2024-01-14: 1000 mg via INTRAVENOUS
  Filled 2024-01-13: qty 200

## 2024-01-13 MED ORDER — HEPARIN SODIUM (PORCINE) 5000 UNIT/ML IJ SOLN
5000.0000 [IU] | Freq: Three times a day (TID) | INTRAMUSCULAR | Status: DC
  Administered 2024-01-14 – 2024-01-19 (×16): 5000 [IU] via SUBCUTANEOUS
  Filled 2024-01-13 (×16): qty 1

## 2024-01-13 MED ORDER — MIDODRINE HCL 5 MG PO TABS
10.0000 mg | ORAL_TABLET | Freq: Every day | ORAL | Status: DC
  Administered 2024-01-13 – 2024-01-19 (×7): 10 mg via ORAL
  Filled 2024-01-13 (×6): qty 2

## 2024-01-13 MED ORDER — LORAZEPAM 1 MG PO TABS
1.0000 mg | ORAL_TABLET | Freq: Once | ORAL | Status: AC
  Administered 2024-01-13: 1 mg via ORAL
  Filled 2024-01-13: qty 1

## 2024-01-13 MED ORDER — LACTATED RINGERS IV BOLUS
1000.0000 mL | Freq: Once | INTRAVENOUS | Status: AC
  Administered 2024-01-13: 1000 mL via INTRAVENOUS

## 2024-01-13 MED ORDER — ACETAMINOPHEN 650 MG RE SUPP: 650.0000 mg | Freq: Four times a day (QID) | RECTAL | Status: DC | PRN

## 2024-01-13 MED ORDER — VANCOMYCIN HCL IN DEXTROSE 1-5 GM/200ML-% IV SOLN
1000.0000 mg | INTRAVENOUS | Status: DC
  Administered 2024-01-16: 1000 mg via INTRAVENOUS
  Filled 2024-01-13 (×2): qty 200

## 2024-01-13 MED ORDER — HEPARIN SODIUM (PORCINE) 1000 UNIT/ML DIALYSIS: 1000.0000 [IU] | INTRAMUSCULAR | Status: DC | PRN

## 2024-01-13 MED ORDER — SODIUM CHLORIDE 0.9 % IV SOLN
2.0000 g | Freq: Once | INTRAVENOUS | Status: AC
  Administered 2024-01-13: 2 g via INTRAVENOUS
  Filled 2024-01-13: qty 12.5

## 2024-01-13 MED ORDER — SODIUM ZIRCONIUM CYCLOSILICATE 10 G PO PACK
10.0000 g | PACK | Freq: Once | ORAL | Status: AC
  Administered 2024-01-13: 10 g via ORAL
  Filled 2024-01-13: qty 1

## 2024-01-13 MED ORDER — CHLORHEXIDINE GLUCONATE CLOTH 2 % EX PADS
6.0000 | MEDICATED_PAD | Freq: Every day | CUTANEOUS | Status: DC
Start: 2024-01-14 — End: 2024-01-19
  Administered 2024-01-14 – 2024-01-19 (×6): 6 via TOPICAL

## 2024-01-13 MED ORDER — LIDOCAINE-PRILOCAINE 2.5-2.5 % EX CREA: 1.0000 | TOPICAL_CREAM | CUTANEOUS | Status: DC | PRN

## 2024-01-13 MED ORDER — INSULIN ASPART 100 UNIT/ML IJ SOLN
0.0000 [IU] | Freq: Every day | INTRAMUSCULAR | Status: DC
  Administered 2024-01-15 – 2024-01-17 (×3): 2 [IU] via SUBCUTANEOUS

## 2024-01-13 MED ORDER — VANCOMYCIN HCL IN DEXTROSE 1-5 GM/200ML-% IV SOLN: 1000.0000 mg | Freq: Once | INTRAVENOUS | Status: DC

## 2024-01-13 MED ORDER — MIDODRINE HCL 5 MG PO TABS
10.0000 mg | ORAL_TABLET | Freq: Once | ORAL | Status: AC
  Administered 2024-01-13: 10 mg via ORAL
  Filled 2024-01-13: qty 2

## 2024-01-13 NOTE — ED Provider Notes (Signed)
 [place] EMERGENCY DEPARTMENT AT [name] Provider Note   CSN: [id number] Arrival date & time: 01/13/24  0129     History  Chief Complaint  Patient presents with   wound bleeding    Amy Moses is a 53 y.o. female.  53 y.o. F with ESRD on HD TTHS, chronic leg wounds follows with Dr. Lajoyce Corners, DM, HTN, hypothyroidism, lower GIB, blind R eye pHTN, and dCHF presents to the ED with draining wound to her right heel.  States she has chronic swelling and lymphedema to her lower extremities and gets wraps by nursing staff twice a week.  Wraps were changed earlier today and then she noticed there was some drainage from her right heel which is atypical.  Denies any injury.  No pain.  She is not certain if it is blood or fluid.  Denies fever, chills, nausea, vomiting.  Does have some mild shortness of breath but is due for dialysis this morning.  She is Tuesday Thursday Saturday dialysis patient.  Denies any blood thinner use.  No dizziness or lightheadedness.  The history is provided by the patient.       Home Medications Prior to Admission medications   Medication Sig Start Date End Date Taking? Authorizing Provider  atorvastatin (LIPITOR) 10 MG tablet Take 10 mg by mouth every evening.    [provider]  AURYXIA 1 GM 210 MG(Fe) tablet Take 630 mg by mouth 3 (three) times daily with meals. 01/07/22   [provider]  Blood Glucose Monitoring Suppl (ACCU-CHEK GUIDE ME) w/Device KIT 1 Piece by Does not apply route as directed. 03/16/22   Roma Kayser, MD  Blood Pressure Monitor KIT TAKE BLOOD PRESSURE DAILY 09/15/22   Chilton Si, MD  Continuous Blood Gluc Receiver (FREESTYLE LIBRE 2 READER) DEVI As directed 04/06/22   Roma Kayser, MD  Continuous Blood Gluc Sensor (FREESTYLE LIBRE 2 SENSOR) MISC 1 Piece by Does not apply route every 14 (fourteen) days. 04/06/22   Roma Kayser, MD  diclofenac (VOLTAREN) 0.1 % ophthalmic solution  Place 1 drop into both eyes daily. 09/22/23   [provider]  diphenoxylate-atropine (LOMOTIL) 2.5-0.025 MG tablet Take 2 tablets by mouth 4 (four) times daily as needed for diarrhea or loose stools.    [provider]  glucose blood (ACCU-CHEK GUIDE) test strip Use as instructed 03/16/22   Roma Kayser, MD  HUMALOG KWIKPEN 100 UNIT/ML KwikPen Inject 5-11 Units into the skin 3 (three) times daily with meals. If eats 50% or more of meal. Patient taking differently: Inject 5-11 Units into the skin 3 (three) times daily with meals. If eats 50% or more of meal. Sliding scale 03/16/22   Nida, Denman George, MD  hydrocortisone (ANUSOL-HC) 2.5 % rectal cream Place 1 Application rectally 2 (two) times daily. 10/24/23   Gelene Mink, NP  insulin glargine (LANTUS) 100 UNIT/ML injection Inject 0.2 mLs (20 Units total) into the skin at bedtime. Patient taking differently: Inject 8 Units into the skin at bedtime. 04/06/22   Roma Kayser, MD  levothyroxine (SYNTHROID) 75 MCG tablet Take 75 mcg by mouth daily. 06/13/22   [provider]  midodrine (PROAMATINE) 10 MG tablet Take 10 mg by mouth daily. 09/27/22   [provider]  ondansetron (ZOFRAN-ODT) 4 MG disintegrating tablet Take 1 tablet (4 mg total) by mouth daily as needed for nausea or vomiting. Only use for severe nausea/vomiting while on Dificid. 11/10/23   Aida Raider, NP  pantoprazole (PROTONIX) 40 MG tablet Take 1 tablet (40 mg total) by mouth 2 (two) times daily. 04/25/21   Shon Hale, MD  prednisoLONE acetate (PRED FORTE) 1 % ophthalmic suspension Place 1 drop into both eyes 3 (three) times daily. 09/21/23   [provider]  tobramycin (TOBREX) 0.3 % ophthalmic solution Place 1 drop into both eyes 3 (three) times daily. 09/21/23   [provider]  Vitamin D, Ergocalciferol, (DRISDOL) 1.25 MG (50000 UNIT) CAPS capsule Take 50,000 Units by mouth every 7 (seven) days. Friday  04/20/21   [provider]      Allergies    Ace inhibitors    Review of Systems   Review of Systems  Constitutional:  Negative for activity change, appetite change and fever.  HENT:  Negative for congestion.   Respiratory:  Positive for shortness of breath. Negative for cough and chest tightness.   Cardiovascular:  Negative for chest pain.  Gastrointestinal:  Negative for abdominal pain, nausea and vomiting.  Genitourinary:  Negative for dysuria.  Musculoskeletal:  Negative for arthralgias and myalgias.  Skin:  Positive for wound.  Neurological:  Negative for dizziness, weakness and headaches.   all other systems are negative except as noted in the HPI and PMH.    Physical Exam Updated Vital Signs BP 95/61 (BP Location: Right Arm)   Pulse 72   Temp 98.1 F (36.7 C) (Oral)   Resp 18   Ht 5\' 6"  (1.676 m)   Wt 91.4 kg   LMP 08/22/2015 (Approximate)   SpO2 98%   BMI 32.52 kg/m  Physical Exam Vitals and nursing note reviewed.  Constitutional:      General: She is not in acute distress.    Appearance: She is well-developed.     Comments: Chronically ill-appearing  HENT:     Head: Normocephalic and atraumatic.     Mouth/Throat:     Pharynx: No oropharyngeal exudate.  Eyes:     Conjunctiva/sclera: Conjunctivae normal.     Pupils: Pupils are equal, round, and reactive to light.  Neck:     Comments: No meningismus. Cardiovascular:     Rate and Rhythm: Normal rate and regular rhythm.     Heart sounds: Normal heart sounds. No murmur heard. Pulmonary:     Effort: Pulmonary effort is normal. No respiratory distress.     Breath sounds: Normal breath sounds.  Chest:     Chest wall: No tenderness.  Abdominal:     Palpations: Abdomen is soft.     Tenderness: There is no abdominal tenderness. There is no guarding or rebound.  Musculoskeletal:        General: Swelling and tenderness present. Normal range of motion.     Cervical back: Normal range of motion and neck  supple.     Comments: Unna boots in place bilaterally.  Upon taking down of the boot there is a draining wound to her right posterior heel as depicted.  Suspect likely abscess. Intact DP pulse to palpation.  Skin:    General: Skin is warm.  Neurological:     Mental Status: She is alert and oriented to person, place, and time.     Cranial Nerves: No cranial nerve deficit.     Motor: No abnormal muscle tone.     Coordination: Coordination normal.     Comments:  5/5 strength throughout. CN 2-12 intact.Equal grip strength.   Psychiatric:        Behavior: Behavior normal.     ED Results /  Procedures / Treatments   Labs (all labs ordered are listed, but only abnormal results are displayed) Labs Reviewed  COMPREHENSIVE METABOLIC PANEL - Abnormal; Notable for the following components:      Result Value   Potassium 5.5 (*)    CO2 21 (*)    Glucose, Bld 124 (*)    BUN 50 (*)    Creatinine, Ser 9.41 (*)    AST 11 (*)    GFR, Estimated 5 (*)    Anion gap 20 (*)    All other components within normal limits  CBC - Abnormal; Notable for the following components:   RBC 3.60 (*)    Hemoglobin 10.5 (*)    HCT 33.4 (*)    All other components within normal limits  CULTURE, BLOOD (ROUTINE X 2)  CULTURE, BLOOD (ROUTINE X 2)  LACTIC ACID, PLASMA    EKG EKG Interpretation Date/Time:  Saturday January 13 2024 04:59:13 EST Ventricular Rate:  66 PR Interval:  161 QRS Duration:  114 QT Interval:  436 QTC Calculation: 457 R Axis:   116  Text Interpretation: Sinus rhythm Borderline intraventricular conduction delay No significant change was found Confirmed by Glynn Octave [phone]) on 01/13/2024 5:03:23 AM  Radiology DG Foot Complete Right Result Date: 01/13/2024 CLINICAL DATA:  Right heel wound. EXAM: RIGHT FOOT COMPLETE - 3+ VIEW COMPARISON:  Right foot series 08/02/2023 FINDINGS: Three views. There is a small ulceration laterally underlying the cuboid bone, small amount of overlying  dystrophic calcification. There is moderate generalized edema greatest in the forefoot. The exact location of the heel ulcer is not clear from these images. There are extensive vascular calcifications. Distal tibial and fibular plate fixation hardware is again noted without loosening. There is severe hallux valgus and first MTP DJD. Fairly profound osteopenia. There are healed fracture deformities of the distal second and third metatarsal shafts, old amputation of the fifth metatarsal and toe. Relatively mild midfoot arthrosis is again noted. No erosive arthropathy is seen. No aggressive periosteal reaction suspicious for acute osteomyelitis. There is a moderate-sized plantar calcaneal spur which appears noninflammatory. Overall, no noteworthy interval change seen compared to prior exam. The swelling in the forefoot has slightly improved, but only very minimally. IMPRESSION: 1. Small ulceration laterally underlying the cuboid bone, with small amount of overlying dystrophic calcification. This was seen previously. 2. No aggressive periosteal reaction suspicious for acute osteomyelitis. 3. Moderate generalized edema greatest in the forefoot. 4. Osteopenia, degenerative and postsurgical changes. 5. Extensive vascular calcifications. Electronically Signed   By: Almira Bar M.D.   On: 01/13/2024 06:45    Procedures Procedures    Medications Ordered in ED Medications - No data to display  ED Course/ Medical Decision Making/ A&P                                 Medical Decision Making Amount and/or Complexity of Data Reviewed Independent Historian: EMS Labs: ordered. Decision-making details documented in ED Course. Radiology: ordered and independent interpretation performed. Decision-making details documented in ED Course. ECG/medicine tests: ordered and independent interpretation performed. Decision-making details documented in ED Course.  Risk Prescription drug management. Decision regarding  hospitalization.   Dialysis patient with chronic swelling to her lower legs here with draining wound to her right heel noticed this evening.  Stable vitals.  She is due for dialysis this morning.  Intact DP pulse.  Concern for abscess rule out osteomyelitis. Xray without evidence  of FB or osteomyelitis.   Lactate and WBC normal. Blood cultures sent. Broad spectrum antibiotics begun. MRI to be obtained to evaluate for abscess or deeper infection.  BP drifting down into 80s. Patient asymptomatic. Does not appear toxic or septic. Does take midodrine which was ordered as well as some IVF.   Will need dialysis today. Mild hyperK. No EKG changes. No increased work of breathing or hypoxic.   MRI pending at shift change. Admission d/w Dr. Maryjean Ka.         Final Clinical Impression(s) / ED Diagnoses Final diagnoses:  Ulcer of right foot, unspecified ulcer stage [facility])    Rx / DC Orders ED Discharge Orders     None         Ceniya Fowers, Jeannett Senior, MD 01/13/24 [phone]

## 2024-01-13 NOTE — ED Triage Notes (Signed)
 The pt is dialysis and she is due for dialysis this am around 0500am    fistula lt arm

## 2024-01-13 NOTE — ED Triage Notes (Signed)
 The pt has a wound on the bottom of her rt foot the wound care person  saw and wrapped the foot saying that it was dry.Marland Kitchen  around 2300 the wound on the bottom of her rt foot started bleeding ?? reason

## 2024-01-13 NOTE — ED Notes (Signed)
 Pt's Bp down to 70s.  MD made aware. Fluids and midodrine ordered.

## 2024-01-13 NOTE — Consult Note (Signed)
 Renal Service Consult Note [facility]  [place] 01/13/2024 Maree Krabbe, MD Requesting Physician: Dr. Maryjean Ka   Reason for Consult: ESRD pt w/ left foot ulcer w/ drainage HPI: The patient is a 53 y.o. year-old w/ PMH as below who presented to ED today c/o worsening wound on her L heel. Noted drainage the last evening. In ED labs were okay for esrd. Pt admitted for infected diabetic foot ulcer. IV abx were started. We are asked to see for dialysis.    Pt seen in ED room. Hx as above. No recent HD issues. Last HD was Thursday. Pt lives w/ her boyfriend. Gets transportation to HD TTS. Dry wt was 92kg, now is in the 80s. Started HD in 2021, using LUA AVF.  No heparin.    PMH Anemia Diastolic heart failure DM2 on insulin HTN Hypothyroidism ESRD on HD HL   ROS - denies CP, no joint pain, no HA, no blurry vision, no rash, no diarrhea, no nausea/ vomiting, no dysuria, no difficulty voiding   Past Medical History  Past Medical History:  Diagnosis Date   Anemia    Blindness of right eye with low vision in contralateral eye    s/p victrectomy   Chronic diastolic heart failure (HCC) 03/30/2021   Diabetes mellitus, type II (HCC)    Dyslipidemia    Glaucoma    History of blood transfusion    Hypertension    Hypothyroidism (acquired)    Kidney disease    Stage 5   Mitral regurgitation 11/16/2022   Pneumonia    Pulmonary hypertension, unspecified (HCC) 09/12/2022   Past Surgical History  Past Surgical History:  Procedure Laterality Date   ABDOMINAL AORTOGRAM W/LOWER EXTREMITY Bilateral 12/18/2020   Procedure: ABDOMINAL AORTOGRAM W/LOWER EXTREMITY;  Surgeon: Sherren Kerns, MD;  Location: [facility];  Service: Cardiovascular;  Laterality: Bilateral;   ABDOMINAL AORTOGRAM W/LOWER EXTREMITY Bilateral 01/25/2022   Procedure: ABDOMINAL AORTOGRAM W/LOWER EXTREMITY;  Surgeon: Nada Libman, MD;  Location: [facility];  Service: Cardiovascular;   Laterality: Bilateral;   ABDOMINAL AORTOGRAM W/LOWER EXTREMITY Right 08/04/2023   Procedure: ABDOMINAL AORTOGRAM W/LOWER EXTREMITY;  Surgeon: Leonie Douglas, MD;  Location: [facility];  Service: Cardiovascular;  Laterality: Right;   AMPUTATION Right 02/16/2022   Procedure: RIGHT FOOT 5TH RAY AMPUTATION;  Surgeon: Nadara Mustard, MD;  Location: [facility] OR;  Service: Orthopedics;  Laterality: Right;   ANKLE FRACTURE SURGERY Right    AV FISTULA PLACEMENT Left 08/18/2020   Procedure: LEFT ARM BRACHIOCEPHALIC ARTERIOVENOUS (AV) FISTULA CREATION;  Surgeon: Sherren Kerns, MD;  Location: [name] OR;  Service: Vascular;  Laterality: Left;   BIOPSY  04/24/2021   Procedure: BIOPSY;  Surgeon: Lanelle Bal, DO;  Location: [facility];  Service: Endoscopy;;   CESAREAN SECTION     CHOLECYSTECTOMY     COLONOSCOPY  04/24/2021   Surgeon: Lanelle Bal, DO;  nonbleeding internal hemorrhoids, 1 large (25 mm) pedunculated transverse colon polyp (prolapse type polyp) with adherent clot and stigmata of recent bleed.   COLONOSCOPY WITH PROPOFOL N/A 05/14/2021   Procedure: COLONOSCOPY WITH PROPOFOL;  Surgeon: Corbin Ade, MD;  [facility]: [facility];  Service: Endoscopy;  Laterality: N/A;   COLONOSCOPY WITH PROPOFOL N/A 03/17/2023   Procedure: COLONOSCOPY WITH PROPOFOL;  Surgeon: Corbin Ade, MD;  [facility]: [facility];  Service: Endoscopy;  Laterality: N/A;   ESOPHAGOGASTRODUODENOSCOPY (EGD) WITH PROPOFOL N/A 04/24/2021   Surgeon: Lanelle Bal,  DO;  duodenal erosions and gastritis biopsied (pathology with peptic duodenitis, reactive gastropathy with erosions/chronic inflammation, negative for H. pylori)   EYE SURGERY     Vatrectomy   HEMOSTASIS CLIP PLACEMENT  05/14/2021   Procedure: HEMOSTASIS CLIP PLACEMENT;  Surgeon: Corbin Ade, MD;  [facility]: [facility];  Service: Endoscopy;;   IR PERC TUN PERIT CATH WO PORT S&I /IMAG  09/15/2020   IR REMOVAL TUN CV CATH W/O FL  02/19/2021    IR US GUIDE VASC ACCESS RIGHT  09/15/2020   POLYPECTOMY  04/24/2021   Procedure: POLYPECTOMY;  Surgeon: Lanelle Bal, DO;  [facility];  Service: Endoscopy;;   POLYPECTOMY  05/14/2021   Procedure: POLYPECTOMY;  Surgeon: Corbin Ade, MD;  [facility];  Service: Endoscopy;;   POLYPECTOMY  03/17/2023   Procedure: POLYPECTOMY;  Surgeon: Corbin Ade, MD;  [facility];  Service: Endoscopy;;   SKIN SPLIT GRAFT Bilateral 09/03/2021   Procedure: SKIN GRAFT BILATERAL LEGS;  Surgeon: Nadara Mustard, MD;  Location: [facility] OR;  Service: Orthopedics;  Laterality: Bilateral;   SKIN SPLIT GRAFT Left 12/10/2021   Procedure: IRRIGATION AND DEBRIDEMENT LEFT CALF, APPLICATION SPLIT THICKNESS SKIN GRAFT;  Surgeon: Nadara Mustard, MD;  Location: [facility] OR;  Service: Orthopedics;  Laterality: Left;   TOE SURGERY     Family History  Family History  Problem Relation Age of Onset   Heart failure Mother    Heart disease Mother    Diabetes Mother    Kidney disease Mother    Heart failure Father    Diabetes Father    Heart disease Father    Diabetes Brother    Heart failure Maternal Grandmother    Heart failure Maternal Grandfather    Transient ischemic attack Maternal Grandfather    Colon cancer Neg Hx    Social History  reports that she has never smoked. She has never used smokeless tobacco. She reports that she does not drink alcohol and does not use drugs. Allergies  Allergies  Allergen Reactions   Ace Inhibitors Cough   Home medications Prior to Admission medications   Medication Sig Start Date End Date Taking? Authorizing Provider  atorvastatin (LIPITOR) 10 MG tablet Take 10 mg by mouth every evening.    [provider]  AURYXIA 1 GM 210 MG(Fe) tablet Take 630 mg by mouth 3 (three) times daily with meals. 01/07/22   [provider]  Blood Glucose Monitoring Suppl (ACCU-CHEK GUIDE ME) w/Device KIT 1 Piece by Does not apply route as directed.  03/16/22   Roma Kayser, MD  Blood Pressure Monitor KIT TAKE BLOOD PRESSURE DAILY 09/15/22   Chilton Si, MD  Continuous Blood Gluc Receiver (FREESTYLE LIBRE 2 READER) DEVI As directed 04/06/22   Roma Kayser, MD  Continuous Blood Gluc Sensor (FREESTYLE LIBRE 2 SENSOR) MISC 1 Piece by Does not apply route every 14 (fourteen) days. 04/06/22   Roma Kayser, MD  diclofenac (VOLTAREN) 0.1 % ophthalmic solution Place 1 drop into both eyes daily. 09/22/23   [provider]  diphenoxylate-atropine (LOMOTIL) 2.5-0.025 MG tablet Take 2 tablets by mouth 4 (four) times daily as needed for diarrhea or loose stools.    [provider]  glucose blood (ACCU-CHEK GUIDE) test strip Use as instructed 03/16/22   Roma Kayser, MD  HUMALOG KWIKPEN 100 UNIT/ML KwikPen Inject 5-11 Units into the skin 3 (three) times daily with meals. If eats 50% or more of  meal. Patient taking differently: Inject 5-11 Units into the skin 3 (three) times daily with meals. If eats 50% or more of meal. Sliding scale 03/16/22   Nida, Denman George, MD  hydrocortisone (ANUSOL-HC) 2.5 % rectal cream Place 1 Application rectally 2 (two) times daily. 10/24/23   Gelene Mink, NP  insulin glargine (LANTUS) 100 UNIT/ML injection Inject 0.2 mLs (20 Units total) into the skin at bedtime. Patient taking differently: Inject 8 Units into the skin at bedtime. 04/06/22   Roma Kayser, MD  levothyroxine (SYNTHROID) 75 MCG tablet Take 75 mcg by mouth daily. 06/13/22   [provider]  midodrine (PROAMATINE) 10 MG tablet Take 10 mg by mouth daily. 09/27/22   [provider]  ondansetron (ZOFRAN-ODT) 4 MG disintegrating tablet Take 1 tablet (4 mg total) by mouth daily as needed for nausea or vomiting. Only use for severe nausea/vomiting while on Dificid. 11/10/23   Aida Raider, NP  pantoprazole (PROTONIX) 40 MG tablet Take 1 tablet (40 mg total) by mouth 2 (two) times daily.  04/25/21   Shon Hale, MD  prednisoLONE acetate (PRED FORTE) 1 % ophthalmic suspension Place 1 drop into both eyes 3 (three) times daily. 09/21/23   [provider]  tobramycin (TOBREX) 0.3 % ophthalmic solution Place 1 drop into both eyes 3 (three) times daily. 09/21/23   [provider]  Vitamin D, Ergocalciferol, (DRISDOL) 1.25 MG (50000 UNIT) CAPS capsule Take 50,000 Units by mouth every 7 (seven) days. Friday 04/20/21   [provider]     Vitals:   01/13/24 0817 01/13/24 1000 01/13/24 1030 01/13/24 1100  BP:  (!) 101/58  (!) 97/57  Pulse: 60 64  62  Resp: 14 17  18   Temp:   99.2 F (37.3 C)   TempSrc:   Oral   SpO2: 100% 99%  92%  Weight:      Height:       Exam Gen alert, no distress, pleasant , on RA No rash, cyanosis or gangrene Sclera anicteric, throat clear  No jvd or bruits Chest clear bilat to bases, no rales/ wheezing RRR no MRG Abd soft ntnd no mass or ascites +bs GU defer MS no joint effusions or deformity Ext bilat LE's esp upper legs are tight w/ chronic edema, no UE or flank edema Neuro is alert, Ox 3 , nf    LUA AVF+bruit       Renal-related home meds: - midodrine 10mg   - auryxia 3 ac tid - others: PPI, insulins, vit D, T4, statin    OP HD: TTS [name], [place]  From dec 2024 -->  3h  91.5kg  2K bath  B400  LUA AVF  Heparin none       Assessment/ Plan: Infected diabetic foot ulcer - started on IV vanc and cefepime. Per pmd.  ESRD - on HD TTS. Labs stable. Plan is for HD today/ tonight.  Chronic hypotension - on midodrine at home, check the dose and cont here. BP's low 100s here.  Volume - chronic bilat diffuse LE edema w/ sig skin hardening, doubt this is reachable w/ dialysis. Max UF 2-3 L.  Anemia of esrd - Hb 10s, follow. Get records.  Secondary hyperparathyroidism - CCa in range, add on phos. Cont binders w/ meals DM2 on insulin      Vinson Moselle  MD [facility] 01/13/2024, 1:05 PM  Recent Labs   Lab 01/13/24 0152  HGB 10.5*  ALBUMIN 3.6  CALCIUM 9.4  CREATININE 9.41*  K 5.5*   Inpatient medications:  [START ON 01/14/2024] heparin  5,000 Units Subcutaneous Q8H   insulin aspart  0-5 Units Subcutaneous QHS   insulin aspart  0-9 Units Subcutaneous TID WC   midodrine  10 mg Oral Daily   sodium chloride flush  3 mL Intravenous Q12H    [START ON 01/16/2024] vancomycin     acetaminophen **OR** acetaminophen, polyethylene glycol

## 2024-01-13 NOTE — Assessment & Plan Note (Signed)
 Patient seems to have chronic hypotension.  Uses midodrine for same.  Continue midodrine.  Telemetry to document stability

## 2024-01-13 NOTE — ED Notes (Signed)
 Gave pt one cup of coffee with two creamers and 5 packs of the equal sugar.

## 2024-01-13 NOTE — Assessment & Plan Note (Signed)
 Patient is noted to have 2 ulcers on the right plantar surface of the foot.  1 is on the heel and 1 along the lateral margin.  Appear to be subcu deep on current exam.  Although patient has marked scaling of skin so it is possible that there is another track that runs deeper.  However at this time clinically speaking I do not appreciate much infection.  It does not look like the patient has a wound culture pending.  Nor do I appreciate anything to culture at this time in terms of topically.  Patient was started on vancomycin and cefepime.  Currently pending an MRI of the foot.  We will follow-up the MRI of the foot continue with vancomycin till then.  I will request wound care evaluation and offload the right heel.  Discussed this with the patient.  Dr. Lajoyce Corners of Ortho was called by ER attending.  We will look forward to their evaluation.

## 2024-01-13 NOTE — ED Notes (Signed)
 Dialysis consent signed.

## 2024-01-13 NOTE — H&P (Addendum)
 History and Physical    Patient: Amy Moses WGN:[id number] DOB: 1971-07-06 DOA: 01/13/2024 DOS: the patient was seen and examined on 01/13/2024 PCP: Lorelei Pont, DO  Patient coming from: Home  Chief Complaint:  Chief Complaint  Patient presents with   wound bleeding   HPI: Amy Moses is a 53 y.o. female with medical history significant of Chronic wounds on her lower extremity right heel..  Patient does get dressing on the foot.  Last evening, patient noted that there was some discharge that was seeping through the wound.  Patient describes this as puslike.  Patient came to the ER for further evaluation.  There is no report of any fever vomiting diarrhea any spreading erythema increased pain.  Vitally stable in ER pending mri ankle. Medical eval is sought.   As a separate issue, patient reports 3 months of right-sided shoulder pain with movement.  After she had "pulled "the shoulder while lifting a jug.  Review of Systems: As mentioned in the history of present illness. All other systems reviewed and are negative. Past Medical History:  Diagnosis Date   Anemia    Blindness of right eye with low vision in contralateral eye    s/p victrectomy   Chronic diastolic heart failure (HCC) 03/30/2021   Diabetes mellitus, type II (HCC)    Dyslipidemia    Glaucoma    History of blood transfusion    Hypertension    Hypothyroidism (acquired)    Kidney disease    Stage 5   Mitral regurgitation 11/16/2022   Pneumonia    Pulmonary hypertension, unspecified (HCC) 09/12/2022   Past Surgical History:  Procedure Laterality Date   ABDOMINAL AORTOGRAM W/LOWER EXTREMITY Bilateral 12/18/2020   Procedure: ABDOMINAL AORTOGRAM W/LOWER EXTREMITY;  Surgeon: Sherren Kerns, MD;  Location: [facility];  Service: Cardiovascular;  Laterality: Bilateral;   ABDOMINAL AORTOGRAM W/LOWER EXTREMITY Bilateral 01/25/2022   Procedure: ABDOMINAL AORTOGRAM W/LOWER EXTREMITY;  Surgeon: Nada Libman, MD;  Location: [facility];  Service: Cardiovascular;  Laterality: Bilateral;   ABDOMINAL AORTOGRAM W/LOWER EXTREMITY Right 08/04/2023   Procedure: ABDOMINAL AORTOGRAM W/LOWER EXTREMITY;  Surgeon: Leonie Douglas, MD;  Location: [facility];  Service: Cardiovascular;  Laterality: Right;   AMPUTATION Right 02/16/2022   Procedure: RIGHT FOOT 5TH RAY AMPUTATION;  Surgeon: Nadara Mustard, MD;  Location: [facility] OR;  Service: Orthopedics;  Laterality: Right;   ANKLE FRACTURE SURGERY Right    AV FISTULA PLACEMENT Left 08/18/2020   Procedure: LEFT ARM BRACHIOCEPHALIC ARTERIOVENOUS (AV) FISTULA CREATION;  Surgeon: Sherren Kerns, MD;  Location: [facility] OR;  Service: Vascular;  Laterality: Left;   BIOPSY  04/24/2021   Procedure: BIOPSY;  Surgeon: Lanelle Bal, DO;  Location: [facility];  Service: Endoscopy;;   CESAREAN SECTION     CHOLECYSTECTOMY     COLONOSCOPY  04/24/2021   Surgeon: Lanelle Bal, DO;  nonbleeding internal hemorrhoids, 1 large (25 mm) pedunculated transverse colon polyp (prolapse type polyp) with adherent clot and stigmata of recent bleed.   COLONOSCOPY WITH PROPOFOL N/A 05/14/2021   Procedure: COLONOSCOPY WITH PROPOFOL;  Surgeon: Corbin Ade, MD;  [facility]: [facility];  Service: Endoscopy;  Laterality: N/A;   COLONOSCOPY WITH PROPOFOL N/A 03/17/2023   Procedure: COLONOSCOPY WITH PROPOFOL;  Surgeon: Corbin Ade, MD;  [facility];  Service: Endoscopy;  Laterality: N/A;   ESOPHAGOGASTRODUODENOSCOPY (EGD) WITH PROPOFOL N/A 04/24/2021   Surgeon: Lanelle Bal, DO;  duodenal erosions and gastritis  biopsied (pathology with peptic duodenitis, reactive gastropathy with erosions/chronic inflammation, negative for H. pylori)   EYE SURGERY     Vatrectomy   HEMOSTASIS CLIP PLACEMENT  05/14/2021   Procedure: HEMOSTASIS CLIP PLACEMENT;  Surgeon: Corbin Ade, MD;  [facility]: [facility];  Service: Endoscopy;;   IR PERC TUN PERIT CATH WO  PORT S&I /IMAG  09/15/2020   IR REMOVAL TUN CV CATH W/O FL  02/19/2021   IR US GUIDE VASC ACCESS RIGHT  09/15/2020   POLYPECTOMY  04/24/2021   Procedure: POLYPECTOMY;  Surgeon: Lanelle Bal, DO;  [facility];  Service: Endoscopy;;   POLYPECTOMY  05/14/2021   Procedure: POLYPECTOMY;  Surgeon: Corbin Ade, MD;  [facility];  Service: Endoscopy;;   POLYPECTOMY  03/17/2023   Procedure: POLYPECTOMY;  Surgeon: Corbin Ade, MD;  [facility];  Service: Endoscopy;;   SKIN SPLIT GRAFT Bilateral 09/03/2021   Procedure: SKIN GRAFT BILATERAL LEGS;  Surgeon: Nadara Mustard, MD;  Location[name] OR;  Service: Orthopedics;  Laterality: Bilateral;   SKIN SPLIT GRAFT Left 12/10/2021   Procedure: IRRIGATION AND DEBRIDEMENT LEFT CALF, APPLICATION SPLIT THICKNESS SKIN GRAFT;  Surgeon: Nadara Mustard, MD;  Location: [facility] OR;  Service: Orthopedics;  Laterality: Left;   TOE SURGERY     Social History:  reports that she has never smoked. She has never used smokeless tobacco. She reports that she does not drink alcohol and does not use drugs.  Allergies  Allergen Reactions   Ace Inhibitors Cough    Family History  Problem Relation Age of Onset   Heart failure Mother    Heart disease Mother    Diabetes Mother    Kidney disease Mother    Heart failure Father    Diabetes Father    Heart disease Father    Diabetes Brother    Heart failure Maternal Grandmother    Heart failure Maternal Grandfather    Transient ischemic attack Maternal Grandfather    Colon cancer Neg Hx     Prior to Admission medications   Medication Sig Start Date End Date Taking? Authorizing Provider  atorvastatin (LIPITOR) 10 MG tablet Take 10 mg by mouth every evening.    [provider]  AURYXIA 1 GM 210 MG(Fe) tablet Take 630 mg by mouth 3 (three) times daily with meals. 01/07/22   [provider]  Blood Glucose Monitoring Suppl (ACCU-CHEK GUIDE ME) w/Device KIT 1 Piece by  Does not apply route as directed. 03/16/22   Roma Kayser, MD  Blood Pressure Monitor KIT TAKE BLOOD PRESSURE DAILY 09/15/22   Chilton Si, MD  Continuous Blood Gluc Receiver (FREESTYLE LIBRE 2 READER) DEVI As directed 04/06/22   Roma Kayser, MD  Continuous Blood Gluc Sensor (FREESTYLE LIBRE 2 SENSOR) MISC 1 Piece by Does not apply route every 14 (fourteen) days. 04/06/22   Roma Kayser, MD  diclofenac (VOLTAREN) 0.1 % ophthalmic solution Place 1 drop into both eyes daily. 09/22/23   [provider]  diphenoxylate-atropine (LOMOTIL) 2.5-0.025 MG tablet Take 2 tablets by mouth 4 (four) times daily as needed for diarrhea or loose stools.    [provider]  glucose blood (ACCU-CHEK GUIDE) test strip Use as instructed 03/16/22   Roma Kayser, MD  HUMALOG KWIKPEN 100 UNIT/ML KwikPen Inject 5-11 Units into the skin 3 (three) times daily with meals. If eats 50% or more of meal. Patient taking differently: Inject 5-11 Units into the skin  3 (three) times daily with meals. If eats 50% or more of meal. Sliding scale 03/16/22   Nida, Denman George, MD  hydrocortisone (ANUSOL-HC) 2.5 % rectal cream Place 1 Application rectally 2 (two) times daily. 10/24/23   Gelene Mink, NP  insulin glargine (LANTUS) 100 UNIT/ML injection Inject 0.2 mLs (20 Units total) into the skin at bedtime. Patient taking differently: Inject 8 Units into the skin at bedtime. 04/06/22   Roma Kayser, MD  levothyroxine (SYNTHROID) 75 MCG tablet Take 75 mcg by mouth daily. 06/13/22   [provider]  midodrine (PROAMATINE) 10 MG tablet Take 10 mg by mouth daily. 09/27/22   [provider]  ondansetron (ZOFRAN-ODT) 4 MG disintegrating tablet Take 1 tablet (4 mg total) by mouth daily as needed for nausea or vomiting. Only use for severe nausea/vomiting while on Dificid. 11/10/23   Aida Raider, NP  pantoprazole (PROTONIX) 40 MG tablet Take 1 tablet (40 mg  total) by mouth 2 (two) times daily. 04/25/21   Shon Hale, MD  prednisoLONE acetate (PRED FORTE) 1 % ophthalmic suspension Place 1 drop into both eyes 3 (three) times daily. 09/21/23   [provider]  tobramycin (TOBREX) 0.3 % ophthalmic solution Place 1 drop into both eyes 3 (three) times daily. 09/21/23   [provider]  Vitamin D, Ergocalciferol, (DRISDOL) 1.25 MG (50000 UNIT) CAPS capsule Take 50,000 Units by mouth every 7 (seven) days. Friday 04/20/21   [provider]    Physical Exam: Vitals:   01/13/24 0800 01/13/24 0817 01/13/24 1000 01/13/24 1030  BP: (!) 84/52  (!) 101/58   Pulse: (!) 59 60 64   Resp: 12 14 17    Temp:    99.2 F (37.3 C)  TempSrc:    Oral  SpO2: 99% 100% 99%   Weight:      Height:       General: Patient is alert and awake, gives a fully coherent account of her symptoms.  Appears to be in no distress Respiratory exam: Bilateral intravesicular Cardiovascular exam S1-S2 normal Abdomen all quadrants are soft nontender Extremities 1+ edema.  Right shoulder no direct tenderness, patient has painful to passive movement.  Distal function is intact. Right foot is closely examined.  Patient has scaly skin with multiple areas of bruising.  There is at least 2 areas of ulceration 1 on the heel which appears to be subcu deep 2cm 2cm round clean no foul smell or spreading erythema.  As well as 1 along the lateral margin of the foot sub cm SQ deep clean. No purulence noted.   Media Information  Document Information  Photos    01/13/2024 04:55  Attached To:  Hospital Encounter on 01/13/24  Source Information  Rancour, Jeannett Senior, MD  [facility]  Document History    Data Reviewed:  Labs on Admission:  Results for orders placed or performed during the hospital encounter of 01/13/24 (from the past 24 hours)  Comprehensive metabolic panel     Status: Abnormal   Collection Time: 01/13/24  1:52 AM  Result Value Ref Range    Sodium 140 135 - 145 mmol/L   Potassium 5.5 (H) 3.5 - 5.1 mmol/L   Chloride 99 98 - 111 mmol/L   CO2 21 (L) 22 - 32 mmol/L   Glucose, Bld 124 (H) 70 - 99 mg/dL   BUN 50 (H) 6 - 20 mg/dL   Creatinine, Ser 4.09 (H) 0.44 - 1.00 mg/dL   Calcium 9.4 8.9 - 10.3  mg/dL   Total Protein 6.9 6.5 - 8.1 g/dL   Albumin 3.6 3.5 - 5.0 g/dL   AST 11 (L) 15 - 41 U/L   ALT 11 0 - 44 U/L   Alkaline Phosphatase 116 38 - 126 U/L   Total Bilirubin 0.8 0.0 - 1.2 mg/dL   GFR, Estimated 5 (L) >60 mL/min   Anion gap 20 (H) 5 - 15  CBC     Status: Abnormal   Collection Time: 01/13/24  1:52 AM  Result Value Ref Range   WBC 8.5 4.0 - 10.5 K/uL   RBC 3.60 (L) 3.87 - 5.11 MIL/uL   Hemoglobin 10.5 (L) 12.0 - 15.0 g/dL   HCT 95.6 (L) 21.3 - 08.6 %   MCV 92.8 80.0 - 100.0 fL   MCH 29.2 26.0 - 34.0 pg   MCHC 31.4 30.0 - 36.0 g/dL   RDW 57.8 46.9 - 62.9 %   Platelets 151 150 - 400 K/uL   nRBC 0.0 0.0 - 0.2 %  Lactic acid, plasma     Status: None   Collection Time: 01/13/24  5:50 AM  Result Value Ref Range   Lactic Acid, Venous 0.9 0.5 - 1.9 mmol/L  Blood culture (routine x 2)     Status: None (Preliminary result)   Collection Time: 01/13/24  5:50 AM   Specimen: BLOOD RIGHT FOREARM  Result Value Ref Range   Specimen Description BLOOD RIGHT FOREARM    Special Requests      BOTTLES DRAWN AEROBIC AND ANAEROBIC Blood Culture results may not be optimal due to an inadequate volume of blood received in culture bottles   Culture      NO GROWTH < 12 HOURS Performed at [name] Lab, [address], [place], [place] [address]    Report Status PENDING   Blood culture (routine x 2)     Status: None (Preliminary result)   Collection Time: 01/13/24  6:22 AM   Specimen: BLOOD  Result Value Ref Range   Specimen Description BLOOD BLOOD RIGHT HAND    Special Requests      BOTTLES DRAWN AEROBIC ONLY Blood Culture adequate volume   Culture      NO GROWTH < 12 HOURS Performed at [facility], [address] [place], [place] [address]    Report Status PENDING    Basic Metabolic Panel: Recent Labs  Lab 01/13/24 0152  NA 140  K 5.5*  CL 99  CO2 21*  GLUCOSE 124*  BUN 50*  CREATININE 9.41*  CALCIUM 9.4   Liver Function Tests: Recent Labs  Lab 01/13/24 0152  AST 11*  ALT 11  ALKPHOS 116  BILITOT 0.8  PROT 6.9  ALBUMIN 3.6   No results for input(s): "LIPASE", "AMYLASE" in the last 168 hours. No results for input(s): "AMMONIA" in the last 168 hours. CBC: Recent Labs  Lab 01/13/24 0152  WBC 8.5  HGB 10.5*  HCT 33.4*  MCV 92.8  PLT 151   Cardiac Enzymes: No results for input(s): "CKTOTAL", "CKMB", "CKMBINDEX", "TROPONINIHS" in the last 168 hours.  BNP (last 3 results) No results for input(s): "PROBNP" in the last 8760 hours. CBG: No results for input(s): "GLUCAP" in the last 168 hours.  Radiological Exams on Admission:  DG Foot Complete Right Result Date: 01/13/2024 CLINICAL DATA:  Right heel wound. EXAM: RIGHT FOOT COMPLETE - 3+ VIEW COMPARISON:  Right foot series 08/02/2023 FINDINGS: Three views. There is a small ulceration laterally underlying the cuboid  bone, small amount of overlying dystrophic calcification. There is moderate generalized edema greatest in the forefoot. The exact location of the heel ulcer is not clear from these images. There are extensive vascular calcifications. Distal tibial and fibular plate fixation hardware is again noted without loosening. There is severe hallux valgus and first MTP DJD. Fairly profound osteopenia. There are healed fracture deformities of the distal second and third metatarsal shafts, old amputation of the fifth metatarsal and toe. Relatively mild midfoot arthrosis is again noted. No erosive arthropathy is seen. No aggressive periosteal reaction suspicious for acute osteomyelitis. There is a moderate-sized plantar calcaneal spur which appears noninflammatory. Overall, no noteworthy interval change seen compared to prior exam. The  swelling in the forefoot has slightly improved, but only very minimally. IMPRESSION: 1. Small ulceration laterally underlying the cuboid bone, with small amount of overlying dystrophic calcification. This was seen previously. 2. No aggressive periosteal reaction suspicious for acute osteomyelitis. 3. Moderate generalized edema greatest in the forefoot. 4. Osteopenia, degenerative and postsurgical changes. 5. Extensive vascular calcifications. Electronically Signed   By: Almira Bar M.D.   On: 01/13/2024 06:45    chest X-ray  EKG: Independently reviewed. No peaked T waves.  No intake/output data recorded. No intake/output data recorded.    Assessment and Plan: * Ulcerated, foot (HCC) Patient is noted to have 2 ulcers on the right plantar surface of the foot.  1 is on the heel and 1 along the lateral margin.  Appear to be subcu deep on current exam.  Although patient has marked scaling of skin so it is possible that there is another track that runs deeper.  However at this time clinically speaking I do not appreciate much infection.  It does not look like the patient has a wound culture pending.  Nor do I appreciate anything to culture at this time in terms of topically.  Patient was started on vancomycin and cefepime.  Currently pending an MRI of the foot.  We will follow-up the MRI of the foot continue with vancomycin till then.  I will request wound care evaluation and offload the right heel.  Discussed this with the patient.  Dr. Lajoyce Corners of Ortho was called by ER attending.  We will look forward to their evaluation.  ESRD (end stage renal disease) on dialysis [facility]) Patient gets hemodialysis Tuesday Thursday Saturday.  Patient noted to have potassium of 5.5.  I have sent a message to Dr. Vida Roller to see if he can add the patient to the list for today.  I will get Lokelma 10 g now  Hypotension Patient seems to have chronic hypotension.  Uses midodrine for same.  Continue midodrine.  Telemetry to  document stability  Shoulder pain 3 months. Will get xray first.   Home med rec pending pharmcy input.    Advance Care Planning:   Code Status: Full Code   Consults: Dr. Lajoyce Corners engaged by ER provider  Family Communication: per pateint.  Severity of Illness: The appropriate patient status for this patient is OBSERVATION. Observation status is judged to be reasonable and necessary in order to provide the required intensity of service to ensure the patient's safety. The patient's presenting symptoms, physical exam findings, and initial radiographic and laboratory data in the context of their medical condition is felt to place them at decreased risk for further clinical deterioration. Furthermore, it is anticipated that the patient will be medically stable for discharge from the hospital within 2 midnights of admission.   Author: Nolberto Hanlon,  MD 01/13/2024 10:40 AM  For on call review [email or web address].

## 2024-01-13 NOTE — Progress Notes (Signed)
 Pharmacy Antibiotic Note  Amy Moses is a 53 y.o. female admitted on 01/13/2024 presenting with draining wound.  Pharmacy has been consulted for vancomycin dosing.  ESRD-HD usually TTS, vanco 1750 mg Iv x 1 given in ED  Plan: Vancomycin 1000 mg Iv qHD Monitor HD schedule, Cx and clinical progression to narrow Vancomycin random level as indicated  Height: 5\' 6"  (167.6 cm) Weight: 91.4 kg (201 lb 8 oz) IBW/kg (Calculated) : 59.3  Temp (24hrs), Avg:97.8 F (36.6 C), Min:97.4 F (36.3 C), Max:98.1 F (36.7 C)  Recent Labs  Lab 01/13/24 0152 01/13/24 0550  WBC 8.5  --   CREATININE 9.41*  --   LATICACIDVEN  --  0.9    Estimated Creatinine Clearance: 8.1 mL/min (A) (by C-G formula based on SCr of 9.41 mg/dL (H)).    Allergies  Allergen Reactions   Ace Inhibitors Cough    Daylene Posey, PharmD, [facility] Clinical Pharmacist ED Pharmacist Phone # [phone] 01/13/2024 10:03 AM

## 2024-01-13 NOTE — Assessment & Plan Note (Signed)
 Patient gets hemodialysis Tuesday Thursday Saturday.  Patient noted to have potassium of 5.5.  I have sent a message to Dr. Vida Roller to see if he can add the patient to the list for today.  I will get Lokelma 10 g now

## 2024-01-13 NOTE — Assessment & Plan Note (Signed)
 3 months. Will get xray first.

## 2024-01-14 DIAGNOSIS — L97511 Non-pressure chronic ulcer of other part of right foot limited to breakdown of skin: Secondary | ICD-10-CM | POA: Diagnosis not present

## 2024-01-14 LAB — VITAMIN B12: Vitamin B-12: 202 pg/mL (ref 180–914)

## 2024-01-14 LAB — CBC
HCT: 30.5 % — ABNORMAL LOW (ref 36.0–46.0)
Hemoglobin: 9.6 g/dL — ABNORMAL LOW (ref 12.0–15.0)
MCH: 28.5 pg (ref 26.0–34.0)
MCHC: 31.5 g/dL (ref 30.0–36.0)
MCV: 90.5 fL (ref 80.0–100.0)
Platelets: 142 10*3/uL — ABNORMAL LOW (ref 150–400)
RBC: 3.37 MIL/uL — ABNORMAL LOW (ref 3.87–5.11)
RDW: 14.3 % (ref 11.5–15.5)
WBC: 7.7 10*3/uL (ref 4.0–10.5)
nRBC: 0.3 % — ABNORMAL HIGH (ref 0.0–0.2)

## 2024-01-14 LAB — BASIC METABOLIC PANEL
Anion gap: 17 — ABNORMAL HIGH (ref 5–15)
BUN: 35 mg/dL — ABNORMAL HIGH (ref 6–20)
CO2: 21 mmol/L — ABNORMAL LOW (ref 22–32)
Calcium: 8.3 mg/dL — ABNORMAL LOW (ref 8.9–10.3)
Chloride: 94 mmol/L — ABNORMAL LOW (ref 98–111)
Creatinine, Ser: 6.59 mg/dL — ABNORMAL HIGH (ref 0.44–1.00)
GFR, Estimated: 7 mL/min — ABNORMAL LOW (ref >60)
Glucose, Bld: 243 mg/dL — ABNORMAL HIGH (ref 70–99)
Potassium: 4.1 mmol/L (ref 3.5–5.1)
Sodium: 132 mmol/L — ABNORMAL LOW (ref 135–145)

## 2024-01-14 LAB — GLUCOSE, CAPILLARY
Glucose-Capillary: 148 mg/dL — ABNORMAL HIGH (ref 70–99)
Glucose-Capillary: 222 mg/dL — ABNORMAL HIGH (ref 70–99)
Glucose-Capillary: 143 mg/dL — ABNORMAL HIGH (ref 70–99)
Glucose-Capillary: 113 mg/dL — ABNORMAL HIGH (ref 70–99)
Glucose-Capillary: 185 mg/dL — ABNORMAL HIGH (ref 70–99)

## 2024-01-14 LAB — PHOSPHORUS: Phosphorus: 5.9 mg/dL — ABNORMAL HIGH (ref 2.5–4.6)

## 2024-01-14 LAB — PROTIME-INR
INR: 1.1 (ref 0.8–1.2)
Prothrombin Time: 14.9 s (ref 11.4–15.2)

## 2024-01-14 LAB — APTT: aPTT: 36 s (ref 24–36)

## 2024-01-14 MED ORDER — ONDANSETRON HCL 4 MG/2ML IJ SOLN
4.0000 mg | Freq: Four times a day (QID) | INTRAMUSCULAR | Status: DC | PRN
  Administered 2024-01-14: 4 mg via INTRAVENOUS
  Filled 2024-01-14: qty 2

## 2024-01-14 MED ORDER — LEVOTHYROXINE SODIUM 75 MCG PO TABS
75.0000 ug | ORAL_TABLET | Freq: Every day | ORAL | Status: DC
  Administered 2024-01-15 – 2024-01-19 (×5): 75 ug via ORAL
  Filled 2024-01-14 (×6): qty 1

## 2024-01-14 MED ORDER — ATORVASTATIN CALCIUM 10 MG PO TABS
10.0000 mg | ORAL_TABLET | Freq: Every evening | ORAL | Status: DC
  Administered 2024-01-14 – 2024-01-18 (×5): 10 mg via ORAL
  Filled 2024-01-14 (×6): qty 1

## 2024-01-14 NOTE — Progress Notes (Signed)
 PROGRESS NOTE    Amy Moses  JYN:[id number] DOB: [birth year] DOA: 01/13/2024 PCP: Lorelei Pont, DO   Chief Complaint  Patient presents with   wound bleeding    Brief Narrative:   Amy Moses is a 54 y.o. female with medical history significant of Chronic wounds on her lower extremity right heel..  Patient does get dressing on the foot.  Last evening, patient noted that there was some discharge that was seeping through the wound.  Patient describes this as puslike.  Patient came to the ER for further evaluation.   There is no report of any fever vomiting diarrhea any spreading erythema increased pain.  Vitally stable in ER pending mri ankle. Medical eval is sought.     As a separate issue, patient reports 3 months of right-sided shoulder pain with movement.  After she had "pulled "the shoulder while lifting a jug.    Assessment & Plan:   Principal Problem:   Ulcerated, foot (HCC) Active Problems:   ESRD (end stage renal disease) on dialysis (HCC)   Shoulder pain   Hypotension  Infected chronic heel ulcer of the right foot/osteomyelitis -Seen in the past by Dr. Lajoyce Corners, and vascular surgery, status post lower extremity angiography, significant for occluded RCA, but otherwise no significant stenosis noted, no intervention. -Treated with prolonged antibiotic course, developed C. Difficile. -On IV vancomycin and cefepime, will DC cefepime given history of C. difficile, continue with vancomycin, will await further recommendation by Dr. Lajoyce Corners   Obstructive sleep apnea Continue with CPAP   Obesity class I Body mass index is 32.52 kg/m.   Sacral pressure ulcer Continue with wound care  End-stage renal disease -IV iron and Procrit per renal, at baseline   Chronic diastolic CHF Management with hemodialysis   ESRD (end stage renal disease) on dialysis [facility]) -neurology consulted, on hemodialysis TTS   Hypothyroidism -Continue with Synthroid   Mixed  hyperlipidemia Continue with home statin  Type 2 diabetes mellitus with ESRD (end-stage renal disease) (HCC) Continue with insulin sliding scale   Hypotension Patient seems to have chronic hypotension.  Uses midodrine for same.  Continue midodrine.  Telemetry to document stability   Shoulder pain No acute finding on chest x-ray, will await further recommendations from Dr. Lajoyce Corners   Diarrhea  -patient reports chronic diarrhea at baseline, but given recent history of C. difficile and she is on antibiotic I will send C. difficile  DVT prophylaxis: Heparin Code Status: Full Family Communication: Cussed with patient, none at bedside Disposition:   Status is: Inpatient    Consultants:  Renal Orthopedic  Subjective: She denies any complaints overnight  Objective: Vitals:   01/14/24 0215 01/14/24 0328 01/14/24 0752 01/14/24 1257  BP: 109/71 108/60 (!) 106/59 115/65  Pulse: 77 75 76 76  Resp: 16 17 20 18   Temp: 98 F (36.7 C) 97.7 F (36.5 C) 98 F (36.7 C) 98.3 F (36.8 C)  TempSrc: Oral Oral Oral Oral  SpO2: 90% 95% 93% 95%  Weight:      Height:        Intake/Output Summary (Last 24 hours) at 01/14/2024 1323 Last data filed at 01/14/2024 0105 Gross per 24 hour  Intake 250 ml  Output 2500 ml  Net -2250 ml   Filed Weights   01/13/24 0144  Weight: 91.4 kg    Examination:  Awake Alert, Oriented X 3 Symmetrical Chest wall movement, Good air movement bilaterally, CTAB RRR,No Gallops,Rubs or new Murmurs, No Parasternal Heave +ve B.Sounds, Abd Soft, No tenderness,  No rebound - guarding or rigidity. Right lower extremity edema, left lower extremity bandaged    Data Reviewed: I have personally reviewed following labs and imaging studies  CBC: Recent Labs  Lab 01/13/24 0152 01/14/24 0547  WBC 8.5 7.7  HGB 10.5* 9.6*  HCT 33.4* 30.5*  MCV 92.8 90.5  PLT 151 142*    Basic Metabolic Panel: Recent Labs  Lab 01/13/24 0152 01/14/24 0547  NA 140 132*  K  5.5* 4.1  CL 99 94*  CO2 21* 21*  GLUCOSE 124* 243*  BUN 50* 35*  CREATININE 9.41* 6.59*  CALCIUM 9.4 8.3*  PHOS  --  5.9*    GFR: Estimated Creatinine Clearance: 11.6 mL/min (A) (by C-G formula based on SCr of 6.59 mg/dL (H)).  Liver Function Tests: Recent Labs  Lab 01/13/24 0152  AST 11*  ALT 11  ALKPHOS 116  BILITOT 0.8  PROT 6.9  ALBUMIN 3.6    CBG: Recent Labs  Lab 01/13/24 1142 01/13/24 1627 01/14/24 0207 01/14/24 0748 01/14/24 1229  GLUCAP 113* 188* 148* 222* 143*     Recent Results (from the past 240 hours)  Blood culture (routine x 2)     Status: None (Preliminary result)   Collection Time: 01/13/24  5:50 AM   Specimen: BLOOD RIGHT FOREARM  Result Value Ref Range Status   Specimen Description BLOOD RIGHT FOREARM  Final   Special Requests   Final    BOTTLES DRAWN AEROBIC AND ANAEROBIC Blood Culture results may not be optimal due to an inadequate volume of blood received in culture bottles   Culture   Final    NO GROWTH 1 DAY Performed at [facility], [address], [place], [place] [address]    Report Status PENDING  Incomplete  Blood culture (routine x 2)     Status: None (Preliminary result)   Collection Time: 01/13/24  6:22 AM   Specimen: BLOOD  Result Value Ref Range Status   Specimen Description BLOOD BLOOD RIGHT HAND  Final   Special Requests   Final    BOTTLES DRAWN AEROBIC ONLY Blood Culture adequate volume   Culture   Final    NO GROWTH 1 DAY Performed at [facility], [address], [place], [place] [address]    Report Status PENDING  Incomplete         Radiology Studies: MR HEEL RIGHT WO CONTRAST Result Date: 01/13/2024 CLINICAL DATA:  Foot swelling, diabetic, osteomyelitis suspected, xray done EXAM: MR OF THE RIGHT HEEL WITHOUT CONTRAST TECHNIQUE: Multiplanar, multisequence MR imaging of the right heel was performed. No intravenous contrast was administered. COMPARISON:  MRI 08/04/2023 FINDINGS:  Bones/Joint/Cartilage Findings of ongoing acute osteomyelitis involving the plantar aspect of the cuboid where there is intense focal bone marrow edema and confluent low T1 marrow signal changes (series 7, image 8). New subcortical bone marrow edema along the lateral aspect of the posterior calcaneus (series 7, image 9). No associated T1 marrow signal alteration at this time. No acute fracture or dislocation is identified. Extensive metallic susceptibility artifact related to patient's ORIF hardware within the distal tibia and fibula. Ligaments Suboptimally assessed given the degree of adjacent artifact. No new ligamentous injury is evident. Muscles and Tendons Generalized muscle atrophy which may be secondary to denervation. No tenosynovitis. Soft tissues Soft tissue wound or ulceration at the plantar aspect of the midfoot underlying the cuboid. Soft tissue edema is present along the plantar aspect of the posterior calcaneus. No drainable fluid collection.  IMPRESSION: 1. Findings of ongoing acute osteomyelitis involving the plantar aspect of the cuboid. 2. New subcortical bone marrow edema along the lateral aspect of the posterior calcaneus. This may be reactive, posttraumatic, or reflect early changes of acute osteomyelitis. 3. Soft tissue wound or ulceration at the plantar aspect of the midfoot underlying the cuboid. No drainable fluid collection. Electronically Signed   By: Duanne Guess D.O.   On: 01/13/2024 12:41   DG Shoulder Right Result Date: 01/13/2024 CLINICAL DATA:  [id number] Pain [id number] EXAM: RIGHT SHOULDER - 2+ VIEW COMPARISON:  Chest x-ray 06/24/2023 FINDINGS: There is no evidence of fracture or dislocation. Question Hill-Sachs deformity. Mild degenerative changes of the shoulder. Soft tissues are unremarkable. Vascular calcifications. IMPRESSION: No acute displaced fracture or dislocation. Electronically Signed   By: Tish Frederickson M.D.   On: 01/13/2024 11:21   DG Foot Complete Right Result  Date: 01/13/2024 CLINICAL DATA:  Right heel wound. EXAM: RIGHT FOOT COMPLETE - 3+ VIEW COMPARISON:  Right foot series 08/02/2023 FINDINGS: Three views. There is a small ulceration laterally underlying the cuboid bone, small amount of overlying dystrophic calcification. There is moderate generalized edema greatest in the forefoot. The exact location of the heel ulcer is not clear from these images. There are extensive vascular calcifications. Distal tibial and fibular plate fixation hardware is again noted without loosening. There is severe hallux valgus and first MTP DJD. Fairly profound osteopenia. There are healed fracture deformities of the distal second and third metatarsal shafts, old amputation of the fifth metatarsal and toe. Relatively mild midfoot arthrosis is again noted. No erosive arthropathy is seen. No aggressive periosteal reaction suspicious for acute osteomyelitis. There is a moderate-sized plantar calcaneal spur which appears noninflammatory. Overall, no noteworthy interval change seen compared to prior exam. The swelling in the forefoot has slightly improved, but only very minimally. IMPRESSION: 1. Small ulceration laterally underlying the cuboid bone, with small amount of overlying dystrophic calcification. This was seen previously. 2. No aggressive periosteal reaction suspicious for acute osteomyelitis. 3. Moderate generalized edema greatest in the forefoot. 4. Osteopenia, degenerative and postsurgical changes. 5. Extensive vascular calcifications. Electronically Signed   By: Almira Bar M.D.   On: 01/13/2024 06:45        Scheduled Meds:  atorvastatin  10 mg Oral QPM   Chlorhexidine Gluconate Cloth  6 each Topical Q0600   heparin  5,000 Units Subcutaneous Q8H   insulin aspart  0-5 Units Subcutaneous QHS   insulin aspart  0-9 Units Subcutaneous TID WC   levothyroxine  75 mcg Oral Daily   midodrine  10 mg Oral Daily   sodium chloride flush  3 mL Intravenous Q12H   Continuous  Infusions:  [START ON 01/16/2024] vancomycin       LOS: 1 day      Huey Bienenstock, MD [facility]   To contact the attending provider between 7A-7P or the covering provider during after hours 7P-7A, please log into the web site [email or web address] and access using universal [place] password for that web site. If you do not have the password, please call the hospital operator.  01/14/2024, 1:23 PM

## 2024-01-14 NOTE — Consult Note (Signed)
 WOC Nurse has reviewed record and this patient has a positive xray or MRI for osteomyelitis, this is considered outside of the scope of practice for the WOC nurse, for that reason WOC Nurse will not consult.  Wound is essentially clean on the heel, there is some darkening of the mid plantar surface.  Await orthopedic evaluation    Offload heel at all times  Re-consult if only topical wound care needed after orthopedic or surgical evaluation Deeana Atwater [facility] MSN, RN, [place], CNS, CWON-AP [phone]

## 2024-01-14 NOTE — Progress Notes (Signed)
 [facility] Progress Note   Subjective:    Seen and examined patient at bedside. Tolerated HD overnight with net UF 2.5L. Next HD 2/25.  Objective Vitals:   01/14/24 0215 01/14/24 0328 01/14/24 0752 01/14/24 1257  BP: 109/71 108/60 (!) 106/59 115/65  Pulse: 77 75 76 76  Resp: 16 17 20 18   Temp: 98 F (36.7 C) 97.7 F (36.5 C) 98 F (36.7 C) 98.3 F (36.8 C)  TempSrc: Oral Oral Oral Oral  SpO2: 90% 95% 93% 95%  Weight:      Height:       Physical Exam General: Awake, alert, on RA, NAD Heart:S1 and S2; No murmurs, gallops, or rubs Lungs: Clear anteriorly; No wheezing, rales, or rhonchi Abdomen: Soft and non-tender Extremities: BL LE's upper legs are tight w/ chronic edema, no UE or flank edema  Dialysis Access: L AVF   Filed Weights   01/13/24 0144  Weight: 91.4 kg    Intake/Output Summary (Last 24 hours) at 01/14/2024 1528 Last data filed at 01/14/2024 0105 Gross per 24 hour  Intake 250 ml  Output 2500 ml  Net -2250 ml    Additional Objective Labs: Basic Metabolic Panel: Recent Labs  Lab 01/13/24 0152 01/14/24 0547  NA 140 132*  K 5.5* 4.1  CL 99 94*  CO2 21* 21*  GLUCOSE 124* 243*  BUN 50* 35*  CREATININE 9.41* 6.59*  CALCIUM 9.4 8.3*  PHOS  --  5.9*   Liver Function Tests: Recent Labs  Lab 01/13/24 0152  AST 11*  ALT 11  ALKPHOS 116  BILITOT 0.8  PROT 6.9  ALBUMIN 3.6   No results for input(s): "LIPASE", "AMYLASE" in the last 168 hours. CBC: Recent Labs  Lab 01/13/24 0152 01/14/24 0547  WBC 8.5 7.7  HGB 10.5* 9.6*  HCT 33.4* 30.5*  MCV 92.8 90.5  PLT 151 142*   Blood Culture    Component Value Date/Time   SDES BLOOD BLOOD RIGHT HAND 01/13/2024 0622   SPECREQUEST  01/13/2024 0622    BOTTLES DRAWN AEROBIC ONLY Blood Culture adequate volume   CULT  01/13/2024 0622    NO GROWTH 1 DAY Performed at [facility], [address], [place], [place] [address]    REPTSTATUS PENDING 01/13/2024 9562    Cardiac  Enzymes: No results for input(s): "CKTOTAL", "CKMB", "CKMBINDEX", "TROPONINI" in the last 168 hours. CBG: Recent Labs  Lab 01/13/24 1627 01/14/24 0207 01/14/24 0748 01/14/24 1229 01/14/24 1507  GLUCAP 188* 148* 222* 143* 113*   Iron Studies: No results for input(s): "IRON", "TIBC", "TRANSFERRIN", "FERRITIN" in the last 72 hours. Lab Results  Component Value Date   INR 1.1 01/14/2024   INR 1.1 03/15/2023   INR 1.1 10/01/2022   Studies/Results: MR HEEL RIGHT WO CONTRAST Result Date: 01/13/2024 CLINICAL DATA:  Foot swelling, diabetic, osteomyelitis suspected, xray done EXAM: MR OF THE RIGHT HEEL WITHOUT CONTRAST TECHNIQUE: Multiplanar, multisequence MR imaging of the right heel was performed. No intravenous contrast was administered. COMPARISON:  MRI 08/04/2023 FINDINGS: Bones/Joint/Cartilage Findings of ongoing acute osteomyelitis involving the plantar aspect of the cuboid where there is intense focal bone marrow edema and confluent low T1 marrow signal changes (series 7, image 8). New subcortical bone marrow edema along the lateral aspect of the posterior calcaneus (series 7, image 9). No associated T1 marrow signal alteration at this time. No acute fracture or dislocation is identified. Extensive metallic susceptibility artifact related to patient's ORIF hardware within the distal tibia and fibula.  Ligaments Suboptimally assessed given the degree of adjacent artifact. No new ligamentous injury is evident. Muscles and Tendons Generalized muscle atrophy which may be secondary to denervation. No tenosynovitis. Soft tissues Soft tissue wound or ulceration at the plantar aspect of the midfoot underlying the cuboid. Soft tissue edema is present along the plantar aspect of the posterior calcaneus. No drainable fluid collection. IMPRESSION: 1. Findings of ongoing acute osteomyelitis involving the plantar aspect of the cuboid. 2. New subcortical bone marrow edema along the lateral aspect of the posterior  calcaneus. This may be reactive, posttraumatic, or reflect early changes of acute osteomyelitis. 3. Soft tissue wound or ulceration at the plantar aspect of the midfoot underlying the cuboid. No drainable fluid collection. Electronically Signed   By: Duanne Guess D.O.   On: 01/13/2024 12:41   DG Shoulder Right Result Date: 01/13/2024 CLINICAL DATA:  [id number] Pain [id number] EXAM: RIGHT SHOULDER - 2+ VIEW COMPARISON:  Chest x-ray 06/24/2023 FINDINGS: There is no evidence of fracture or dislocation. Question Hill-Sachs deformity. Mild degenerative changes of the shoulder. Soft tissues are unremarkable. Vascular calcifications. IMPRESSION: No acute displaced fracture or dislocation. Electronically Signed   By: Tish Frederickson M.D.   On: 01/13/2024 11:21   DG Foot Complete Right Result Date: 01/13/2024 CLINICAL DATA:  Right heel wound. EXAM: RIGHT FOOT COMPLETE - 3+ VIEW COMPARISON:  Right foot series 08/02/2023 FINDINGS: Three views. There is a small ulceration laterally underlying the cuboid bone, small amount of overlying dystrophic calcification. There is moderate generalized edema greatest in the forefoot. The exact location of the heel ulcer is not clear from these images. There are extensive vascular calcifications. Distal tibial and fibular plate fixation hardware is again noted without loosening. There is severe hallux valgus and first MTP DJD. Fairly profound osteopenia. There are healed fracture deformities of the distal second and third metatarsal shafts, old amputation of the fifth metatarsal and toe. Relatively mild midfoot arthrosis is again noted. No erosive arthropathy is seen. No aggressive periosteal reaction suspicious for acute osteomyelitis. There is a moderate-sized plantar calcaneal spur which appears noninflammatory. Overall, no noteworthy interval change seen compared to prior exam. The swelling in the forefoot has slightly improved, but only very minimally. IMPRESSION: 1. Small ulceration  laterally underlying the cuboid bone, with small amount of overlying dystrophic calcification. This was seen previously. 2. No aggressive periosteal reaction suspicious for acute osteomyelitis. 3. Moderate generalized edema greatest in the forefoot. 4. Osteopenia, degenerative and postsurgical changes. 5. Extensive vascular calcifications. Electronically Signed   By: Almira Bar M.D.   On: 01/13/2024 06:45    Medications:  [START ON 01/16/2024] vancomycin      atorvastatin  10 mg Oral QPM   Chlorhexidine Gluconate Cloth  6 each Topical Q0600   heparin  5,000 Units Subcutaneous Q8H   insulin aspart  0-5 Units Subcutaneous QHS   insulin aspart  0-9 Units Subcutaneous TID WC   levothyroxine  75 mcg Oral Daily   midodrine  10 mg Oral Daily   sodium chloride flush  3 mL Intravenous Q12H    Dialysis Orders: [place] [facility] [place] From dec 2024 -->  3h  91.5kg  2K bath  B400  LUA AVF  Heparin none   Renal-related home meds: - midodrine 10mg   - auryxia 3 ac tid - others: PPI, insulins, vit D, T4, statin    Assessment/Plan: Infected diabetic foot ulcer - started on IV vanc and cefepime. Per pmd. Patient was seen by Dr. Lajoyce Corners and Vascular  in the past. S/p lower extremity angiography which apparently showed significant occluded RCA, but otherwise no significant stenosis noted, no intervention.  ESRD - on HD TTS. Received HD overnight 2.5L off. Next HD 2/25. Chronic hypotension - on midodrine at home, check the dose and cont here. BP's low 100s here.  Volume - chronic bilat diffuse LE edema w/ sig skin hardening, doubt this is reachable w/ dialysis. Max UF 2-3 L.  Anemia of esrd - Hb 10s, follow. Get records.  Secondary hyperparathyroidism - CCa in range, add on phos. Cont binders w/ meals DM2 on insulin  Salome Holmes, NP [facility] 01/14/2024,3:28 PM  LOS: 1 day

## 2024-01-14 NOTE — Plan of Care (Signed)
   Problem: Coping: Goal: Ability to adjust to condition or change in health will improve Outcome: Progressing   Problem: Metabolic: Goal: Ability to maintain appropriate glucose levels will improve Outcome: Progressing   Problem: Nutritional: Goal: Maintenance of adequate nutrition will improve Outcome: Progressing

## 2024-01-15 ENCOUNTER — Telehealth (HOSPITAL_COMMUNITY): Payer: Self-pay | Admitting: Pharmacy Technician

## 2024-01-15 ENCOUNTER — Other Ambulatory Visit (HOSPITAL_COMMUNITY): Payer: Self-pay

## 2024-01-15 DIAGNOSIS — L97411 Non-pressure chronic ulcer of right heel and midfoot limited to breakdown of skin: Secondary | ICD-10-CM | POA: Diagnosis not present

## 2024-01-15 DIAGNOSIS — M86271 Subacute osteomyelitis, right ankle and foot: Secondary | ICD-10-CM | POA: Diagnosis not present

## 2024-01-15 DIAGNOSIS — A0472 Enterocolitis due to Clostridium difficile, not specified as recurrent: Secondary | ICD-10-CM | POA: Diagnosis not present

## 2024-01-15 DIAGNOSIS — L97511 Non-pressure chronic ulcer of other part of right foot limited to breakdown of skin: Secondary | ICD-10-CM | POA: Diagnosis not present

## 2024-01-15 LAB — BASIC METABOLIC PANEL
Anion gap: 14 (ref 5–15)
BUN: 50 mg/dL — ABNORMAL HIGH (ref 6–20)
CO2: 21 mmol/L — ABNORMAL LOW (ref 22–32)
Calcium: 8.4 mg/dL — ABNORMAL LOW (ref 8.9–10.3)
Chloride: 98 mmol/L (ref 98–111)
Creatinine, Ser: 7.83 mg/dL — ABNORMAL HIGH (ref 0.44–1.00)
GFR, Estimated: 6 mL/min — ABNORMAL LOW (ref >60)
Glucose, Bld: 199 mg/dL — ABNORMAL HIGH (ref 70–99)
Potassium: 4.8 mmol/L (ref 3.5–5.1)
Sodium: 133 mmol/L — ABNORMAL LOW (ref 135–145)

## 2024-01-15 LAB — CBC
HCT: 31.3 % — ABNORMAL LOW (ref 36.0–46.0)
Hemoglobin: 9.9 g/dL — ABNORMAL LOW (ref 12.0–15.0)
MCH: 29 pg (ref 26.0–34.0)
MCHC: 31.6 g/dL (ref 30.0–36.0)
MCV: 91.8 fL (ref 80.0–100.0)
Platelets: 142 10*3/uL — ABNORMAL LOW (ref 150–400)
RBC: 3.41 MIL/uL — ABNORMAL LOW (ref 3.87–5.11)
RDW: 14.4 % (ref 11.5–15.5)
WBC: 7.1 10*3/uL (ref 4.0–10.5)
nRBC: 0 % (ref 0.0–0.2)

## 2024-01-15 LAB — C DIFFICILE QUICK SCREEN W PCR REFLEX
C Diff antigen: POSITIVE — AB
C Diff interpretation: DETECTED
C Diff toxin: POSITIVE — AB

## 2024-01-15 LAB — GLUCOSE, CAPILLARY
Glucose-Capillary: 178 mg/dL — ABNORMAL HIGH (ref 70–99)
Glucose-Capillary: 169 mg/dL — ABNORMAL HIGH (ref 70–99)
Glucose-Capillary: 138 mg/dL — ABNORMAL HIGH (ref 70–99)
Glucose-Capillary: 206 mg/dL — ABNORMAL HIGH (ref 70–99)

## 2024-01-15 MED ORDER — VANCOMYCIN HCL 125 MG PO CAPS: 125.0000 mg | ORAL_CAPSULE | ORAL | Status: DC

## 2024-01-15 MED ORDER — VANCOMYCIN HCL 125 MG PO CAPS
125.0000 mg | ORAL_CAPSULE | Freq: Four times a day (QID) | ORAL | Status: DC
  Administered 2024-01-15 (×2): 125 mg via ORAL
  Filled 2024-01-15 (×2): qty 1

## 2024-01-15 MED ORDER — VANCOMYCIN HCL 125 MG PO CAPS: 125.0000 mg | ORAL_CAPSULE | Freq: Two times a day (BID) | ORAL | Status: DC

## 2024-01-15 MED ORDER — MIDODRINE HCL 5 MG PO TABS
10.0000 mg | ORAL_TABLET | ORAL | Status: DC
  Administered 2024-01-16 – 2024-01-18 (×2): 10 mg via ORAL
  Filled 2024-01-15 (×3): qty 2

## 2024-01-15 MED ORDER — FERRIC CITRATE 1 GM 210 MG(FE) PO TABS
630.0000 mg | ORAL_TABLET | Freq: Three times a day (TID) | ORAL | Status: DC
  Administered 2024-01-15 – 2024-01-19 (×11): 630 mg via ORAL
  Filled 2024-01-15 (×12): qty 3

## 2024-01-15 MED ORDER — VANCOMYCIN HCL 125 MG PO CAPS
125.0000 mg | ORAL_CAPSULE | Freq: Four times a day (QID) | ORAL | Status: DC
Start: 2024-01-15 — End: 2024-01-15

## 2024-01-15 MED ORDER — VANCOMYCIN HCL 125 MG PO CAPS: 125.0000 mg | ORAL_CAPSULE | Freq: Every day | ORAL | Status: DC

## 2024-01-15 MED ORDER — VANCOMYCIN HCL 125 MG PO CAPS
125.0000 mg | ORAL_CAPSULE | Freq: Three times a day (TID) | ORAL | Status: DC
  Administered 2024-01-15 – 2024-01-19 (×17): 125 mg via ORAL
  Filled 2024-01-15 (×17): qty 1

## 2024-01-15 MED ORDER — VANCOMYCIN HCL 125 MG PO CAPS: 125.0000 mg | ORAL_CAPSULE | Freq: Three times a day (TID) | ORAL | Status: DC

## 2024-01-15 MED ORDER — CHLORHEXIDINE GLUCONATE CLOTH 2 % EX PADS
6.0000 | MEDICATED_PAD | Freq: Every day | CUTANEOUS | Status: DC
  Administered 2024-01-16 – 2024-01-19 (×4): 6 via TOPICAL

## 2024-01-15 NOTE — Discharge Instructions (Addendum)
 Wheelchair Ramp Assistance Agencies: -[employer] [place]: 501(c)3 nonprofit that builds [employer], wooden wheelchair ramps for impoverished persons with long-term, ambulatory disabilities. Contact: [email or web address] [phone] [address], [id number], [place], [place] [address].  -Project: Homes can build and install wheelchair ramps.  Supported by our Insurance risk surveyor, we build and install wheelchair ramps for low-income elderly and disabled homeowners at no charge. [address] [place], [place] [address]/ p. [phone]  -[employer]: [phone]

## 2024-01-15 NOTE — Progress Notes (Signed)
 Pt receives out-pt HD at [facility] TTS. Will assist as needed.   Olivia Canter Renal Navigator [phone]

## 2024-01-15 NOTE — Progress Notes (Addendum)
 [facility] Progress Note   Subjective: seen lying in bed. No C/Os.    Objective Vitals:   01/15/24 0349 01/15/24 0350 01/15/24 0826 01/15/24 1249  BP:  103/67 102/60 104/63  Pulse: 73 74 72 71  Resp: 19 17 17 18   Temp:  97.6 F (36.4 C) 97.9 F (36.6 C) 97.8 F (36.6 C)  TempSrc:  Oral Oral Oral  SpO2:   98% 95%  Weight:      Height:       Physical Exam General: Chronically ill appearing female in NAD Heart: S1,S2 No M/R/G Lungs: CTAB. No WOB Abdomen: NABS, NT Extremities: Woody appearance BLE + edema  Dialysis Access: L AVF + T/B  Additional Objective Labs: Basic Metabolic Panel: Recent Labs  Lab 01/13/24 0152 01/14/24 0547 01/15/24 0459  NA 140 132* 133*  K 5.5* 4.1 4.8  CL 99 94* 98  CO2 21* 21* 21*  GLUCOSE 124* 243* 199*  BUN 50* 35* 50*  CREATININE 9.41* 6.59* 7.83*  CALCIUM 9.4 8.3* 8.4*  PHOS  --  5.9*  --    Liver Function Tests: Recent Labs  Lab 01/13/24 0152  AST 11*  ALT 11  ALKPHOS 116  BILITOT 0.8  PROT 6.9  ALBUMIN 3.6   No results for input(s): "LIPASE", "AMYLASE" in the last 168 hours. CBC: Recent Labs  Lab 01/13/24 0152 01/14/24 0547 01/15/24 0459  WBC 8.5 7.7 7.1  HGB 10.5* 9.6* 9.9*  HCT 33.4* 30.5* 31.3*  MCV 92.8 90.5 91.8  PLT 151 142* 142*   Blood Culture    Component Value Date/Time   SDES BLOOD RIGHT HAND 01/13/2024 0622   SPECREQUEST  01/13/2024 0622    BOTTLES DRAWN AEROBIC ONLY Blood Culture adequate volume   CULT  01/13/2024 0622    NO GROWTH 2 DAYS Performed at [facility], [address], [place], [place] [address]    REPTSTATUS PENDING 01/13/2024 6045    Cardiac Enzymes: No results for input(s): "CKTOTAL", "CKMB", "CKMBINDEX", "TROPONINI" in the last 168 hours. CBG: Recent Labs  Lab 01/14/24 1229 01/14/24 1507 01/14/24 2138 01/15/24 1007 01/15/24 1242  GLUCAP 143* 113* 185* 178* 169*   Iron Studies: No results for input(s): "IRON", "TIBC", "TRANSFERRIN", "FERRITIN"  in the last 72 hours. @lablastinr3 @ Studies/Results: No results found. Medications:  [START ON 01/16/2024] vancomycin      atorvastatin  10 mg Oral QPM   Chlorhexidine Gluconate Cloth  6 each Topical Q0600   ferric citrate  630 mg Oral TID WC   heparin  5,000 Units Subcutaneous Q8H   insulin aspart  0-5 Units Subcutaneous QHS   insulin aspart  0-9 Units Subcutaneous TID WC   levothyroxine  75 mcg Oral Daily   midodrine  10 mg Oral Daily   sodium chloride flush  3 mL Intravenous Q12H   vancomycin  125 mg Oral TID AC & HS   Followed by   Melene Muller ON 01/29/2024] vancomycin  125 mg Oral BID   Followed by   Melene Muller ON 02/05/2024] vancomycin  125 mg Oral Daily   Followed by   Melene Muller ON 02/12/2024] vancomycin  125 mg Oral QODAY   Followed by   Melene Muller ON 03/11/2024] vancomycin  125 mg Oral Q3 days    Dialysis Orders: [facility] [place] -->  3h  91.5kg  2K bath  B400  LUA AVF  Heparin none    Renal-related home meds: - midodrine 10mg   - auryxia 3  ac tid - others: PPI, insulins, vit D, T4, statin    Assessment/Plan: Infected diabetic foot ulcer - started on IV vanc and cefepime. Per pmd. Patient was seen by Dr. Lajoyce Corners and Vascular in the past. S/p lower extremity angiography which apparently showed significant occluded RCA, but otherwise no significant stenosis noted. Seen by Dr. Lajoyce Corners today. No surgical intervention planned.  ESRD - on HD TTS. Received HD overnight 2.5L off. Next HD 2/25. Chronic hypotension - on midodrine at home, check the dose and cont here. BP's low 100s here. May need extra midodrine to facilitate volume removal.  Volume - chronic bilat diffuse LE edema w/ sig skin hardening, doubt this is reachable w/ dialysis. Max UF 2-3 L. UF as tolerated.  Anemia of esrd - Hb 10s, follow. Get records.  Secondary hyperparathyroidism - CCa in range, add on phos. Cont binders w/ meals DM2 on insulin  Rita H. Brown NP-C 01/15/2024, 12:59 PM  [facility] [phone]    Seen and examined independently.  Agree with note and exam as documented above by physician extender and as noted here.  General adult female in bed in no acute distress HEENT normocephalic atraumatic extraocular movements intact sclera anicteric Neck supple trachea midline Lungs clear to auscultation bilaterally normal work of breathing at rest  Heart S1S2 no rub Abdomen soft nontender nondistended Extremities left leg wrapped. Right leg chronic-appearing dry skin/woody edema Psych normal mood and affect Access LUE AVF with bruit    ESRD on HD - HD per TTS schedule   C dif - therapies per primary team   Chronic hypotension on midodrine  Estanislado Emms, MD 01/15/2024  3:43 PM

## 2024-01-15 NOTE — Evaluation (Signed)
 Physical Therapy Evaluation Patient Details Name: Amy Moses MRN: [id number] DOB: June 12, 1971 Today's Date: 01/15/2024  History of Present Illness  Amy Moses is a 53 y.o. female admitted 2/22 with right heel ulcer.  Pt w/u for right BKA in future if medical management doesnt work.  Cdiff. MVH:QIONGEX wounds on her lower extremity right heel, ESRD, HD T,TH,Sat; sacral ulcer  Clinical Impression  Pt admitted with above diagnosis. Pt was only able to roll left and right to be cleaned.  She rolls well and can assist a lot. Anticipate that pt can transfer with min assist to wheelchair. Will assess at next visit if diarrhea subsides.  Should be able to progress and go home with caregivers.  Pt currently with functional limitations due to the deficits listed below (see PT Problem List). Pt will benefit from acute skilled PT to increase their independence and safety with mobility to allow discharge.           If plan is discharge home, recommend the following: A little help with walking and/or transfers;A little help with bathing/dressing/bathroom;Assistance with cooking/housework;Assist for transportation;Help with stairs or ramp for entrance   Can travel by private vehicle        Equipment Recommendations None recommended by PT  Recommendations for Other Services       Functional Status Assessment Patient has had a recent decline in their functional status and demonstrates the ability to make significant improvements in function in a reasonable and predictable amount of time.     Precautions / Restrictions Precautions Precautions: Fall Precaution/Restrictions Comments: Cdiff Restrictions Weight Bearing Restrictions Per Provider Order: No      Mobility  Bed Mobility Overal bed mobility: Needs Assistance Bed Mobility: Rolling Rolling: Modified independent (Device/Increase time)         General bed mobility comments: used rails and rolled left and right to clean pt and  remove bed pan. Also changed buttocks dressing as prior dressing was soiled.    Transfers                   General transfer comment: Pt declined as she has been having BMs frequently per pt    Ambulation/Gait                  Stairs            Wheelchair Mobility     Tilt Bed    Modified Rankin (Stroke Patients Only)       Balance                                             Pertinent Vitals/Pain Pain Assessment Pain Assessment: No/denies pain    Home Living Family/patient expects to be discharged to:: Private residence Living Arrangements: Spouse/significant other Available Help at Discharge: Family Type of Home: House Home Access: Ramped entrance       Home Layout: One level Home Equipment: Wheelchair - manual;Hospital bed;Other (comment);BSC/3in1;Adaptive equipment (SLIDING BOARD) Additional Comments: Has a home health nurse who changes BLE dressings 2x/week.    Prior Function Prior Level of Function : Independent/Modified Independent             Mobility Comments: lateral scoots to/from wheelchair; has transfer board for when surface height varies ADLs Comments: Mod I for ADL with exception of donning socks. Asking about AE at eval     Extremity/Trunk  Assessment   Upper Extremity Assessment Upper Extremity Assessment: Defer to OT evaluation    Lower Extremity Assessment Lower Extremity Assessment: LLE deficits/detail;RLE deficits/detail RLE Deficits / Details: WFL ROM, grossly 3/5 LLE Deficits / Details: WFL ROM, grossly 3/5    Cervical / Trunk Assessment Cervical / Trunk Assessment: Normal  Communication   Communication Communication: No apparent difficulties    Cognition Arousal: Alert Behavior During Therapy: WFL for tasks assessed/performed   PT - Cognitive impairments: No apparent impairments                         Following commands: Intact       Cueing       General  Comments General comments (skin integrity, edema, etc.): 127/81, 78 99% ra    Exercises General Exercises - Lower Extremity Ankle Circles/Pumps: AROM, Both, 10 reps, Supine Quad Sets: AROM, Both, 10 reps, Supine Heel Slides: AROM, Both, 10 reps, Supine   Assessment/Plan    PT Assessment Patient needs continued PT services  PT Problem List Decreased activity tolerance;Decreased balance;Decreased mobility;Decreased knowledge of use of DME;Decreased safety awareness;Decreased knowledge of precautions;Decreased skin integrity       PT Treatment Interventions DME instruction;Functional mobility training;Therapeutic activities;Therapeutic exercise;Balance training;Patient/family education;Wheelchair mobility training    PT Goals (Current goals can be found in the Care Plan section)  Acute Rehab PT Goals Patient Stated Goal: to go home PT Goal Formulation: With patient Time For Goal Achievement: 01/29/24 Potential to Achieve Goals: Good    Frequency Min 1X/week     Co-evaluation               AM-PAC PT "6 Clicks" Mobility  Outcome Measure Help needed turning from your back to your side while in a flat bed without using bedrails?: None Help needed moving from lying on your back to sitting on the side of a flat bed without using bedrails?: A Little Help needed moving to and from a bed to a chair (including a wheelchair)?: A Lot Help needed standing up from a chair using your arms (e.g., wheelchair or bedside chair)?: Total Help needed to walk in hospital room?: Total Help needed climbing 3-5 steps with a railing? : Total 6 Click Score: 12    End of Session   Activity Tolerance: Patient limited by fatigue Patient left: in bed;with call bell/phone within reach;with bed alarm set Nurse Communication: Mobility status PT Visit Diagnosis: Muscle weakness (generalized) (M62.81)    Time: 1324-4010 PT Time Calculation (min) (ACUTE ONLY): 20 min   Charges:   PT Evaluation $PT  Eval Moderate Complexity: 1 Mod   PT General Charges $$ ACUTE PT VISIT: 1 Visit         Addilynn Mowrer M,PT Acute Rehab Services [phone]   Bevelyn Buckles 01/15/2024, 4:09 PM

## 2024-01-15 NOTE — Plan of Care (Signed)
  Problem: Education: Goal: Ability to describe self-care measures that may prevent or decrease complications (Diabetes Survival Skills Education) will improve Outcome: Progressing   Problem: Coping: Goal: Ability to adjust to condition or change in health will improve Outcome: Progressing   Problem: Fluid Volume: Goal: Ability to maintain a balanced intake and output will improve Outcome: Progressing   Problem: Metabolic: Goal: Ability to maintain appropriate glucose levels will improve Outcome: Progressing   Problem: Skin Integrity: Goal: Risk for impaired skin integrity will decrease Outcome: Progressing   Problem: Education: Goal: Knowledge of General Education information will improve Description: Including pain rating scale, medication(s)/side effects and non-pharmacologic comfort measures Outcome: Progressing   Problem: Clinical Measurements: Goal: Will remain free from infection Outcome: Progressing Goal: Diagnostic test results will improve Outcome: Progressing

## 2024-01-15 NOTE — Telephone Encounter (Signed)
 Patient Product/process development scientist completed.    The patient is insured through [employer]. Patient has Medicare and is not eligible for a copay card, but may be able to apply for patient assistance or Medicare RX Payment Plan (Patient Must reach out to their plan, if eligible for payment plan), if available.    Ran test claim for vancomycin 125 mg and the current 14 day co-pay is $0.00.   This test claim was processed through [facility]- copay amounts may vary at other pharmacies due to pharmacy/plan contracts, or as the patient moves through the different stages of their insurance plan.     Roland Earl, CPHT Pharmacy Technician III Certified Patient Advocate [facility] Patient Advocate Team Direct Number: [phone]  Fax: [phone]

## 2024-01-15 NOTE — Consult Note (Signed)
 ORTHOPAEDIC CONSULTATION  REQUESTING PHYSICIAN: Elgergawy, Leana Roe, MD  Chief Complaint: New ulcer right heel.  HPI: Amy Moses is a 52 y.o. female who presents with new ulcer right heel.  Patient states she noticed acute drainage from this new ulcer.  Past medical history includes diabetes end-stage renal disease on dialysis with venous, lymphatic insufficiency of both lower extremities and history of calciphylaxis.  Past Medical History:  Diagnosis Date   Anemia    Blindness of right eye with low vision in contralateral eye    s/p victrectomy   Chronic diastolic heart failure (HCC) 03/30/2021   Diabetes mellitus, type II (HCC)    Dyslipidemia    ESRD on hemodialysis (HCC)    TTS in [place] , [place]   Glaucoma    History of blood transfusion    Hypertension    Hypothyroidism (acquired)    Mitral regurgitation 11/16/2022   Pneumonia    Pulmonary hypertension, unspecified (HCC) 09/12/2022   Past Surgical History:  Procedure Laterality Date   ABDOMINAL AORTOGRAM W/LOWER EXTREMITY Bilateral 12/18/2020   Procedure: ABDOMINAL AORTOGRAM W/LOWER EXTREMITY;  Surgeon: Sherren Kerns, MD;  Location: [facility];  Service: Cardiovascular;  Laterality: Bilateral;   ABDOMINAL AORTOGRAM W/LOWER EXTREMITY Bilateral 01/25/2022   Procedure: ABDOMINAL AORTOGRAM W/LOWER EXTREMITY;  Surgeon: Nada Libman, MD;  Location: [facility];  Service: Cardiovascular;  Laterality: Bilateral;   ABDOMINAL AORTOGRAM W/LOWER EXTREMITY Right 08/04/2023   Procedure: ABDOMINAL AORTOGRAM W/LOWER EXTREMITY;  Surgeon: Leonie Douglas, MD;  Location: [facility];  Service: Cardiovascular;  Laterality: Right;   AMPUTATION Right 02/16/2022   Procedure: RIGHT FOOT 5TH RAY AMPUTATION;  Surgeon: Nadara Mustard, MD;  Location: [facility] OR;  Service: Orthopedics;  Laterality: Right;   ANKLE FRACTURE SURGERY Right    AV FISTULA PLACEMENT Left 08/18/2020   Procedure: LEFT ARM BRACHIOCEPHALIC  ARTERIOVENOUS (AV) FISTULA CREATION;  Surgeon: Sherren Kerns, MD;  Location: [facility] OR;  Service: Vascular;  Laterality: Left;   BIOPSY  04/24/2021   Procedure: BIOPSY;  Surgeon: Lanelle Bal, DO;  [facility]: [facility];  Service: Endoscopy;;   CESAREAN SECTION     CHOLECYSTECTOMY     COLONOSCOPY  04/24/2021   Surgeon: Lanelle Bal, DO;  nonbleeding internal hemorrhoids, 1 large (25 mm) pedunculated transverse colon polyp (prolapse type polyp) with adherent clot and stigmata of recent bleed.   COLONOSCOPY WITH PROPOFOL N/A 05/14/2021   Procedure: COLONOSCOPY WITH PROPOFOL;  Surgeon: Corbin Ade, MD;  [facility]: [facility];  Service: Endoscopy;  Laterality: N/A;   COLONOSCOPY WITH PROPOFOL N/A 03/17/2023   Procedure: COLONOSCOPY WITH PROPOFOL;  Surgeon: Corbin Ade, MD;  [facility];  Service: Endoscopy;  Laterality: N/A;   ESOPHAGOGASTRODUODENOSCOPY (EGD) WITH PROPOFOL N/A 04/24/2021   Surgeon: Lanelle Bal, DO;  duodenal erosions and gastritis biopsied (pathology with peptic duodenitis, reactive gastropathy with erosions/chronic inflammation, negative for H. pylori)   EYE SURGERY     Vatrectomy   HEMOSTASIS CLIP PLACEMENT  05/14/2021   Procedure: HEMOSTASIS CLIP PLACEMENT;  Surgeon: Corbin Ade, MD;  [facility];  Service: Endoscopy;;   IR PERC TUN PERIT CATH WO PORT S&I /IMAG  09/15/2020   IR REMOVAL TUN CV CATH W/O FL  02/19/2021   IR US GUIDE VASC ACCESS RIGHT  09/15/2020   POLYPECTOMY  04/24/2021   Procedure: POLYPECTOMY;  Surgeon: Lanelle Bal, DO;  [facility];  Service: Endoscopy;;  POLYPECTOMY  05/14/2021   Procedure: POLYPECTOMY;  Surgeon: Corbin Ade, MD;  [facility];  Service: Endoscopy;;   POLYPECTOMY  03/17/2023   Procedure: POLYPECTOMY;  Surgeon: Corbin Ade, MD;  [facility];  Service: Endoscopy;;   SKIN SPLIT GRAFT Bilateral 09/03/2021   Procedure: SKIN GRAFT BILATERAL  LEGS;  Surgeon: Nadara Mustard, MD;  Location: [facility];  Service: Orthopedics;  Laterality: Bilateral;   SKIN SPLIT GRAFT Left 12/10/2021   Procedure: IRRIGATION AND DEBRIDEMENT LEFT CALF, APPLICATION SPLIT THICKNESS SKIN GRAFT;  Surgeon: Nadara Mustard, MD;  Location: [facility];  Service: Orthopedics;  Laterality: Left;   TOE SURGERY     Social History   Socioeconomic History   Marital status: Single    Spouse name: Not on file   Number of children: Not on file   Years of education: Not on file   Highest education level: Not on file  Occupational History   Not on file  Tobacco Use   Smoking status: Never   Smokeless tobacco: Never  Vaping Use   Vaping status: Never Used  Substance and Sexual Activity   Alcohol use: No   Drug use: No   Sexual activity: Yes    Birth control/protection: Condom  Other Topics Concern   Not on file  Social History Narrative   Not on file   Social Drivers of Health   Financial Resource Strain: Not on file  Food Insecurity: Food Insecurity Present (10/31/2023)   Hunger Vital Sign    Worried About Running Out of Food in the Last Year: Sometimes true    Ran Out of Food in the Last Year: Sometimes true  Transportation Needs: Unmet Transportation Needs (10/31/2023)   PRAPARE - Administrator, Civil Service (Medical): Yes    Lack of Transportation (Non-Medical): Yes  Physical Activity: Not on file  Stress: Not on file  Social Connections: Not on file   Family History  Problem Relation Age of Onset   Heart failure Mother    Heart disease Mother    Diabetes Mother    Kidney disease Mother    Heart failure Father    Diabetes Father    Heart disease Father    Diabetes Brother    Heart failure Maternal Grandmother    Heart failure Maternal Grandfather    Transient ischemic attack Maternal Grandfather    Colon cancer Neg Hx    - negative except otherwise stated in the family history section Allergies  Allergen Reactions   Ace  Inhibitors Cough   Prior to Admission medications   Medication Sig Start Date End Date Taking? Authorizing Provider  atorvastatin (LIPITOR) 10 MG tablet Take 10 mg by mouth every evening.   Yes [provider]  AURYXIA 1 GM 210 MG(Fe) tablet Take 630 mg by mouth 3 (three) times daily with meals. 01/07/22  Yes [provider]  diphenoxylate-atropine (LOMOTIL) 2.5-0.025 MG tablet Take 2 tablets by mouth 4 (four) times daily as needed for diarrhea or loose stools.   Yes [provider]  HUMALOG KWIKPEN 100 UNIT/ML KwikPen Inject 5-11 Units into the skin 3 (three) times daily with meals. If eats 50% or more of meal. Patient taking differently: Inject 5-11 Units into the skin 3 (three) times daily with meals. If eats 50% or more of meal. Sliding scale 03/16/22  Yes Nida, Denman George, MD  insulin glargine (LANTUS) 100 UNIT/ML injection Inject 0.2 mLs (20 Units total) into the  skin at bedtime. Patient taking differently: Inject 8 Units into the skin at bedtime. 04/06/22  Yes Nida, Denman George, MD  levothyroxine (SYNTHROID) 75 MCG tablet Take 75 mcg by mouth daily. 06/13/22  Yes [provider]  midodrine (PROAMATINE) 10 MG tablet Take 10 mg by mouth daily. 09/27/22  Yes [provider]  ondansetron (ZOFRAN-ODT) 4 MG disintegrating tablet Take 1 tablet (4 mg total) by mouth daily as needed for nausea or vomiting. Only use for severe nausea/vomiting while on Dificid. 11/10/23  Yes Mahon, Frederik Schmidt, NP  pantoprazole (PROTONIX) 40 MG tablet Take 1 tablet (40 mg total) by mouth 2 (two) times daily. 04/25/21  Yes Emokpae, Courage, MD  Vitamin D, Ergocalciferol, (DRISDOL) 1.25 MG (50000 UNIT) CAPS capsule Take 50,000 Units by mouth every 7 (seven) days. Friday 04/20/21  Yes [provider]  Blood Glucose Monitoring Suppl (ACCU-CHEK GUIDE ME) w/Device KIT 1 Piece by Does not apply route as directed. 03/16/22   Roma Kayser, MD  Blood Pressure Monitor  KIT TAKE BLOOD PRESSURE DAILY 09/15/22   Chilton Si, MD  Continuous Blood Gluc Receiver (FREESTYLE LIBRE 2 READER) DEVI As directed 04/06/22   Roma Kayser, MD  Continuous Blood Gluc Sensor (FREESTYLE LIBRE 2 SENSOR) MISC 1 Piece by Does not apply route every 14 (fourteen) days. 04/06/22   Roma Kayser, MD  glucose blood (ACCU-CHEK GUIDE) test strip Use as instructed 03/16/22   Roma Kayser, MD   MR HEEL RIGHT WO CONTRAST Result Date: 01/13/2024 CLINICAL DATA:  Foot swelling, diabetic, osteomyelitis suspected, xray done EXAM: MR OF THE RIGHT HEEL WITHOUT CONTRAST TECHNIQUE: Multiplanar, multisequence MR imaging of the right heel was performed. No intravenous contrast was administered. COMPARISON:  MRI 08/04/2023 FINDINGS: Bones/Joint/Cartilage Findings of ongoing acute osteomyelitis involving the plantar aspect of the cuboid where there is intense focal bone marrow edema and confluent low T1 marrow signal changes (series 7, image 8). New subcortical bone marrow edema along the lateral aspect of the posterior calcaneus (series 7, image 9). No associated T1 marrow signal alteration at this time. No acute fracture or dislocation is identified. Extensive metallic susceptibility artifact related to patient's ORIF hardware within the distal tibia and fibula. Ligaments Suboptimally assessed given the degree of adjacent artifact. No new ligamentous injury is evident. Muscles and Tendons Generalized muscle atrophy which may be secondary to denervation. No tenosynovitis. Soft tissues Soft tissue wound or ulceration at the plantar aspect of the midfoot underlying the cuboid. Soft tissue edema is present along the plantar aspect of the posterior calcaneus. No drainable fluid collection. IMPRESSION: 1. Findings of ongoing acute osteomyelitis involving the plantar aspect of the cuboid. 2. New subcortical bone marrow edema along the lateral aspect of the posterior calcaneus. This may be  reactive, posttraumatic, or reflect early changes of acute osteomyelitis. 3. Soft tissue wound or ulceration at the plantar aspect of the midfoot underlying the cuboid. No drainable fluid collection. Electronically Signed   By: Duanne Guess D.O.   On: 01/13/2024 12:41   DG Shoulder Right Result Date: 01/13/2024 CLINICAL DATA:  [id number] Pain [id number] EXAM: RIGHT SHOULDER - 2+ VIEW COMPARISON:  Chest x-ray 06/24/2023 FINDINGS: There is no evidence of fracture or dislocation. Question Hill-Sachs deformity. Mild degenerative changes of the shoulder. Soft tissues are unremarkable. Vascular calcifications. IMPRESSION: No acute displaced fracture or dislocation. Electronically Signed   By: Tish Frederickson M.D.   On: 01/13/2024 11:21   - pertinent xrays, CT, MRI studies were reviewed and independently  interpreted  Positive ROS: All other systems have been reviewed and were otherwise negative with the exception of those mentioned in the HPI and as above.  Physical Exam: General: Alert, no acute distress Psychiatric: Patient is competent for consent with normal mood and affect Lymphatic: No axillary or cervical lymphadenopathy Cardiovascular: No pedal edema Respiratory: No cyanosis, no use of accessory musculature GI: No organomegaly, abdomen is soft and non-tender    Images:  @ENCIMAGES @  Labs:  Lab Results  Component Value Date   HGBA1C 9.0 (H) 08/02/2023   HGBA1C 9.4 (H) 10/01/2022   HGBA1C 11.5 (H) 02/15/2022   ESRSEDRATE 7 08/03/2023   ESRSEDRATE 34 (H) 02/14/2022   ESRSEDRATE 40 (H) 08/07/2021   CRP 1.7 (H) 08/03/2023   CRP 7.0 (H) 02/14/2022   CRP 11.1 (H) 08/07/2021   REPTSTATUS PENDING 01/13/2024   GRAMSTAIN  07/08/2021    RARE WBC PRESENT,BOTH PMN AND MONONUCLEAR RARE GRAM POSITIVE RODS RARE GRAM POSITIVE COCCI IN PAIRS    CULT  01/13/2024    NO GROWTH 2 DAYS Performed at [facility], [address], [place], [place] [address]    LABORGA CITROBACTER KOSERI  04/28/2022    Lab Results  Component Value Date   ALBUMIN 3.6 01/13/2024   ALBUMIN 3.4 (L) 11/02/2023   ALBUMIN 4.1 10/30/2023   PREALBUMIN 22 08/03/2023   PREALBUMIN 23 08/02/2023   PREALBUMIN 13.7 (L) 08/07/2021        Latest Ref Rng & Units 01/15/2024    4:59 AM 01/14/2024    5:47 AM 01/13/2024    1:52 AM  CBC EXTENDED  WBC 4.0 - 10.5 K/uL 7.1  7.7  8.5   RBC 3.87 - 5.11 MIL/uL 3.41  3.37  3.60   Hemoglobin 12.0 - 15.0 g/dL 9.9  9.6  60.4   HCT 54.0 - 46.0 % 31.3  30.5  33.4   Platelets 150 - 400 K/uL 142  142  151     Neurologic: Patient does not have protective sensation bilateral lower extremities.   MUSCULOSKELETAL:   Skin: Examination patient has a new superficial ischemic ulcer to the right heel.  There is no depth the ulcer is 1 cm in diameter with ischemic changes.  There is no surrounding cellulitis.  No undermining.  Patient has a chronic ulcer over the cuboid on the right foot.  There is a dry superficial ulcer over this location no fluctuance no cellulitis no swelling.  Patient's previous ABI study showed inline flow to her foot.  Examination of both legs the swelling has significantly decreased and patient has improved soft tissue in both lower extremities with no open ulcers at this time.  Hemoglobin 9.9 with a white cell count of 7.1.  Review the MRI scan shows the chronic bone edema of the cuboid which is essentially unchanged.  There is no surrounding abscess.    Assessment: Assessment: Resolving swelling both legs with no ulcers on either leg.  Patient has a new decubitus right heel ulcer which is superficial and ischemic.  Patient has chronic osteomyelitis of the cuboid that is stable.  Plan: Plan: Recommended patient continue wearing the Prevalon boots continue with the compression wraps.  Discussed that if we did require surgical intervention for the cuboid infection we would have to proceed with a below-knee amputation.  No surgical intervention  indicated at this time.  Plan to follow-up in the office.  Thank you for the consult and the opportunity to see Ms. Reinecke  Aldean Baker,  MD [facility] [phone] 8:17 AM

## 2024-01-15 NOTE — Progress Notes (Signed)
 PROGRESS NOTE    Amy Moses  ZOX:[id number] DOB: 02/01/1971 DOA: 01/13/2024 PCP: Lorelei Pont, DO   Chief Complaint  Patient presents with   wound bleeding    Brief Narrative:   Amy Moses is a 53 y.o. female with medical history significant of Chronic wounds on her lower extremity right heel..  Patient does get dressing on the foot.  Last evening, patient noted that there was some discharge that was seeping through the wound.  Patient describes this as puslike.  Patient came to the ER for further evaluation.   There is no report of any fever vomiting diarrhea any spreading erythema increased pain.  Vitally stable in ER pending mri ankle. Medical eval is sought.     As a separate issue, patient reports 3 months of right-sided shoulder pain with movement.  After she had "pulled "the shoulder while lifting a jug.    Assessment & Plan:   Principal Problem:   Ulcerated, foot (HCC) Active Problems:   ESRD (end stage renal disease) on dialysis (HCC)   Shoulder pain   Hypotension   Skin ulcer of right heel, limited to breakdown of skin (HCC)  Infected chronic heel ulcer of the right foot/osteomyelitis -Seen in the past by Dr. Lajoyce Corners, and vascular surgery, status post lower extremity angiography, significant for occluded RCA, but otherwise no significant stenosis noted, no intervention. -Treated with prolonged antibiotic course, developed C. Difficile. -On IV vancomycin and cefepime, did discontinue cefepime given history of C. Difficile -Return to the input greatly appreciated, continue wearing the Prevalon boot with compression wrap, currently no indication for surgery, but once indicated will need to proceed with below-knee amputation.  Obstructive sleep apnea Continue with CPAP   Obesity class I Body mass index is 32.52 kg/m.   Sacral pressure ulcer Continue with wound care  End-stage renal disease -IV iron and Procrit per renal, at baseline   Chronic diastolic  CHF Management with hemodialysis   ESRD (end stage renal disease) on dialysis [facility]) -neurology consulted, on hemodialysis TTS   Hypothyroidism -Continue with Synthroid   Mixed hyperlipidemia Continue with home statin  Type 2 diabetes mellitus with ESRD (end-stage renal disease) (HCC) Continue with insulin sliding scale   Hypotension Patient seems to have chronic hypotension.  Uses midodrine for same.  Continue midodrine.  Telemetry to document stability   Shoulder pain No acute finding on chest x-ray, will await further recommendations from Dr. Lajoyce Corners   C. difficile diarrhea -patient reports chronic diarrhea at baseline, during recently, C. difficile was obtained, significant for positive antigen and toxin, will start on prolonged vancomycin taper.    Sacral pressure ulcer, POA -Continue with wound care    DVT prophylaxis: Heparin Code Status: Full Family Communication: Discussed with patient, none at bedside Disposition:   Status is: Inpatient    Consultants:  Renal Orthopedic  Subjective: She reports diarrhea over last 24 hours, but has improved  Objective: Vitals:   01/15/24 0349 01/15/24 0350 01/15/24 0826 01/15/24 1249  BP:  103/67 102/60 104/63  Pulse: 73 74 72 71  Resp: 19 17 17 18   Temp:  97.6 F (36.4 C) 97.9 F (36.6 C) 97.8 F (36.6 C)  TempSrc:  Oral Oral Oral  SpO2:   98% 95%  Weight:      Height:       No intake or output data in the 24 hours ending 01/15/24 1255  Filed Weights   01/13/24 0144  Weight: 91.4 kg    Examination:  Awake  Alert, Oriented X 3 Symmetrical Chest wall movement, Good air movement bilaterally, CTAB RRR,No Gallops,Rubs or new Murmurs, No Parasternal Heave +ve B.Sounds, Abd Soft, No tenderness, No rebound - guarding or rigidity. No Cyanosis, Clubbing or edema, left lower extremity bandaged     Data Reviewed: I have personally reviewed following labs and imaging studies  CBC: Recent Labs  Lab  01/13/24 0152 01/14/24 0547 01/15/24 0459  WBC 8.5 7.7 7.1  HGB 10.5* 9.6* 9.9*  HCT 33.4* 30.5* 31.3*  MCV 92.8 90.5 91.8  PLT 151 142* 142*    Basic Metabolic Panel: Recent Labs  Lab 01/13/24 0152 01/14/24 0547 01/15/24 0459  NA 140 132* 133*  K 5.5* 4.1 4.8  CL 99 94* 98  CO2 21* 21* 21*  GLUCOSE 124* 243* 199*  BUN 50* 35* 50*  CREATININE 9.41* 6.59* 7.83*  CALCIUM 9.4 8.3* 8.4*  PHOS  --  5.9*  --     GFR: Estimated Creatinine Clearance: 9.8 mL/min (A) (by C-G formula based on SCr of 7.83 mg/dL (H)).  Liver Function Tests: Recent Labs  Lab 01/13/24 0152  AST 11*  ALT 11  ALKPHOS 116  BILITOT 0.8  PROT 6.9  ALBUMIN 3.6    CBG: Recent Labs  Lab 01/14/24 1229 01/14/24 1507 01/14/24 2138 01/15/24 1007 01/15/24 1242  GLUCAP 143* 113* 185* 178* 169*     Recent Results (from the past 240 hours)  Blood culture (routine x 2)     Status: None (Preliminary result)   Collection Time: 01/13/24  5:50 AM   Specimen: BLOOD RIGHT FOREARM  Result Value Ref Range Status   Specimen Description BLOOD RIGHT FOREARM  Final   Special Requests   Final    BOTTLES DRAWN AEROBIC AND ANAEROBIC Blood Culture results may not be optimal due to an inadequate volume of blood received in culture bottles   Culture   Final    NO GROWTH 2 DAYS Performed at [facility], [address] [place], [place] [address]    Report Status PENDING  Incomplete  Blood culture (routine x 2)     Status: None (Preliminary result)   Collection Time: 01/13/24  6:22 AM   Specimen: BLOOD RIGHT HAND  Result Value Ref Range Status   Specimen Description BLOOD RIGHT HAND  Final   Special Requests   Final    BOTTLES DRAWN AEROBIC ONLY Blood Culture adequate volume   Culture   Final    NO GROWTH 2 DAYS Performed at [facility], [address] [place], [place] [address]    Report Status PENDING  Incomplete  C Difficile Quick Screen w PCR reflex     Status: Abnormal   Collection  Time: 01/14/24 11:18 PM   Specimen: STOOL  Result Value Ref Range Status   C Diff antigen POSITIVE (A) NEGATIVE Final   C Diff toxin POSITIVE (A) NEGATIVE Final   C Diff interpretation Toxin producing C. difficile detected.  Final    Comment: RESULT CALLED TO, READ BACK BY AND VERIFIED WITH: D MELAND RN 01/15/2024 @ 0113 BY AB Performed at [facility], [address], [place], [place] [address]          Radiology Studies: No results found.       Scheduled Meds:  atorvastatin  10 mg Oral QPM   Chlorhexidine Gluconate Cloth  6 each Topical Q0600   heparin  5,000 Units Subcutaneous Q8H   insulin aspart  0-5 Units Subcutaneous  QHS   insulin aspart  0-9 Units Subcutaneous TID WC   levothyroxine  75 mcg Oral Daily   midodrine  10 mg Oral Daily   sodium chloride flush  3 mL Intravenous Q12H   vancomycin  125 mg Oral TID AC & HS   Followed by   Melene Muller ON 01/29/2024] vancomycin  125 mg Oral BID   Followed by   Melene Muller ON 02/05/2024] vancomycin  125 mg Oral Daily   Followed by   Melene Muller ON 02/12/2024] vancomycin  125 mg Oral QODAY   Followed by   Melene Muller ON 03/11/2024] vancomycin  125 mg Oral Q3 days   Continuous Infusions:  [START ON 01/16/2024] vancomycin       LOS: 2 days      Huey Bienenstock, MD [facility]   To contact the attending provider between 7A-7P or the covering provider during after hours 7P-7A, please log into the web site [email or web address] and access using universal [place] password for that web site. If you do not have the password, please call the hospital operator.  01/15/2024, 12:55 PM

## 2024-01-16 DIAGNOSIS — L97511 Non-pressure chronic ulcer of other part of right foot limited to breakdown of skin: Secondary | ICD-10-CM | POA: Diagnosis not present

## 2024-01-16 LAB — GLUCOSE, CAPILLARY
Glucose-Capillary: 162 mg/dL — ABNORMAL HIGH (ref 70–99)
Glucose-Capillary: 151 mg/dL — ABNORMAL HIGH (ref 70–99)
Glucose-Capillary: 168 mg/dL — ABNORMAL HIGH (ref 70–99)
Glucose-Capillary: 241 mg/dL — ABNORMAL HIGH (ref 70–99)

## 2024-01-16 LAB — RENAL FUNCTION PANEL
Albumin: 3.2 g/dL — ABNORMAL LOW (ref 3.5–5.0)
Anion gap: 12 (ref 5–15)
BUN: 63 mg/dL — ABNORMAL HIGH (ref 6–20)
CO2: 21 mmol/L — ABNORMAL LOW (ref 22–32)
Calcium: 8.7 mg/dL — ABNORMAL LOW (ref 8.9–10.3)
Chloride: 98 mmol/L (ref 98–111)
Creatinine, Ser: 8.85 mg/dL — ABNORMAL HIGH (ref 0.44–1.00)
GFR, Estimated: 5 mL/min — ABNORMAL LOW (ref >60)
Glucose, Bld: 144 mg/dL — ABNORMAL HIGH (ref 70–99)
Phosphorus: 8.6 mg/dL — ABNORMAL HIGH (ref 2.5–4.6)
Potassium: 5 mmol/L (ref 3.5–5.1)
Sodium: 131 mmol/L — ABNORMAL LOW (ref 135–145)

## 2024-01-16 LAB — CBC
HCT: 33 % — ABNORMAL LOW (ref 36.0–46.0)
Hemoglobin: 10.4 g/dL — ABNORMAL LOW (ref 12.0–15.0)
MCH: 28.5 pg (ref 26.0–34.0)
MCHC: 31.5 g/dL (ref 30.0–36.0)
MCV: 90.4 fL (ref 80.0–100.0)
Platelets: 151 10*3/uL (ref 150–400)
RBC: 3.65 MIL/uL — ABNORMAL LOW (ref 3.87–5.11)
RDW: 14.5 % (ref 11.5–15.5)
WBC: 7.5 10*3/uL (ref 4.0–10.5)
nRBC: 0 % (ref 0.0–0.2)

## 2024-01-16 LAB — HEPATITIS B SURFACE ANTIBODY, QUANTITATIVE: Hep B S AB Quant (Post): <3.5 m[IU]/mL — ABNORMAL LOW

## 2024-01-16 MED ORDER — HEPARIN SODIUM (PORCINE) 1000 UNIT/ML DIALYSIS: 1000.0000 [IU] | INTRAMUSCULAR | Status: DC | PRN

## 2024-01-16 MED ORDER — INSULIN GLARGINE 100 UNIT/ML ~~LOC~~ SOLN
5.0000 [IU] | Freq: Every day | SUBCUTANEOUS | Status: DC
  Administered 2024-01-16 – 2024-01-19 (×3): 5 [IU] via SUBCUTANEOUS
  Filled 2024-01-16 (×4): qty 0.05

## 2024-01-16 MED ORDER — MEDIHONEY WOUND/BURN DRESSING EX PSTE
1.0000 | PASTE | Freq: Every day | CUTANEOUS | Status: DC
Start: 2024-01-16 — End: 2024-01-19
  Administered 2024-01-16 – 2024-01-19 (×3): 1 via TOPICAL
  Filled 2024-01-16: qty 44

## 2024-01-16 MED ORDER — LIDOCAINE-PRILOCAINE 2.5-2.5 % EX CREA: 1.0000 | TOPICAL_CREAM | CUTANEOUS | Status: DC | PRN

## 2024-01-16 MED ORDER — LIDOCAINE HCL (PF) 1 % IJ SOLN: 5.0000 mL | INTRAMUSCULAR | Status: DC | PRN

## 2024-01-16 MED ORDER — PENTAFLUOROPROP-TETRAFLUOROETH EX AERO: 1.0000 | INHALATION_SPRAY | CUTANEOUS | Status: DC | PRN

## 2024-01-16 NOTE — Progress Notes (Signed)
 PT Cancellation Note  Patient Details Name: Myliyah Rebuck MRN: [id number] DOB: 04-10-1971   Cancelled Treatment:    Reason Eval/Treat Not Completed: (P) Patient at procedure or test/unavailable, pt off unit at HD. Will check back as schedule allows to continue with PT POC.  Lenora Boys. PTA Acute Rehabilitation Services Office: [phone]    Catalina Antigua 01/16/2024, 3:11 PM

## 2024-01-16 NOTE — Progress Notes (Addendum)
 [facility] Progress Note   Subjective:Seen in room. No C/Os. HD later today.   Objective Vitals:   01/16/24 0505 01/16/24 0506 01/16/24 0515 01/16/24 0833  BP: 120/67   (!) 96/55  Pulse: 66 66  70  Resp: 14 13    Temp:  (!) 97.4 F (36.3 C)  (!) 97.4 F (36.3 C)  TempSrc:  Axillary  Oral  SpO2: 98% 97%  97%  Weight:   75.3 kg   Height:       Physical Exam General: Chronically ill appearing female in NAD Heart: S1,S2 No M/R/G Lungs: CTAB. No WOB Abdomen: NABS, NT Extremities: Woody appearance BLE + edema  Dialysis Access: L AVF + T/B  Additional Objective Labs: Basic Metabolic Panel: Recent Labs  Lab 01/13/24 0152 01/14/24 0547 01/15/24 0459  NA 140 132* 133*  K 5.5* 4.1 4.8  CL 99 94* 98  CO2 21* 21* 21*  GLUCOSE 124* 243* 199*  BUN 50* 35* 50*  CREATININE 9.41* 6.59* 7.83*  CALCIUM 9.4 8.3* 8.4*  PHOS  --  5.9*  --    Liver Function Tests: Recent Labs  Lab 01/13/24 0152  AST 11*  ALT 11  ALKPHOS 116  BILITOT 0.8  PROT 6.9  ALBUMIN 3.6   No results for input(s): "LIPASE", "AMYLASE" in the last 168 hours. CBC: Recent Labs  Lab 01/13/24 0152 01/14/24 0547 01/15/24 0459  WBC 8.5 7.7 7.1  HGB 10.5* 9.6* 9.9*  HCT 33.4* 30.5* 31.3*  MCV 92.8 90.5 91.8  PLT 151 142* 142*   Blood Culture    Component Value Date/Time   SDES BLOOD RIGHT HAND 01/13/2024 0622   SPECREQUEST  01/13/2024 0622    BOTTLES DRAWN AEROBIC ONLY Blood Culture adequate volume   CULT  01/13/2024 0622    NO GROWTH 3 DAYS Performed at [facility], [address], [place], [place] [address]    REPTSTATUS PENDING 01/13/2024 6045    Cardiac Enzymes: No results for input(s): "CKTOTAL", "CKMB", "CKMBINDEX", "TROPONINI" in the last 168 hours. CBG: Recent Labs  Lab 01/15/24 1007 01/15/24 1242 01/15/24 1711 01/15/24 2216 01/16/24 0832  GLUCAP 178* 169* 138* 206* 162*   Iron Studies: No results for input(s): "IRON", "TIBC", "TRANSFERRIN", "FERRITIN"  in the last 72 hours. @lablastinr3 @ Studies/Results: No results found. Medications:  vancomycin      atorvastatin  10 mg Oral QPM   Chlorhexidine Gluconate Cloth  6 each Topical Q0600   Chlorhexidine Gluconate Cloth  6 each Topical Q0600   ferric citrate  630 mg Oral TID WC   heparin  5,000 Units Subcutaneous Q8H   insulin aspart  0-5 Units Subcutaneous QHS   insulin aspart  0-9 Units Subcutaneous TID WC   insulin glargine  5 Units Subcutaneous Daily   levothyroxine  75 mcg Oral Daily   midodrine  10 mg Oral Daily   midodrine  10 mg Oral Q T,Th,Sa-HD   sodium chloride flush  3 mL Intravenous Q12H   vancomycin  125 mg Oral TID AC & HS   Followed by   Melene Muller ON 01/29/2024] vancomycin  125 mg Oral BID   Followed by   Melene Muller ON 02/05/2024] vancomycin  125 mg Oral Daily   Followed by   Melene Muller ON 02/12/2024] vancomycin  125 mg Oral QODAY   Followed by   Melene Muller ON 03/11/2024] vancomycin  125 mg Oral Q3 days     Dialysis Orders: [place] From [place]24 -->  3h   91.5kg  2K bath  B400  LUA AVF  Heparin none    Renal-related home meds: - midodrine 10mg   - auryxia 3 ac tid - others: PPI, insulins, vit D, T4, statin      Assessment/Plan: Infected diabetic foot ulcer - started on IV vanc and cefepime. Per pmd. Patient was seen by Dr. Lajoyce Corners and Vascular in the past. S/p lower extremity angiography which apparently showed significant occluded RCA, but otherwise no significant stenosis noted. Seen by Dr. Lajoyce Corners today. No surgical intervention planned.  ESRD - on HD TTS. Received HD overnight 2.5L off. Next HD 2/25. Chronic hypotension - on midodrine at home, check the dose and cont here. BP's low 100s here. May need extra midodrine to facilitate volume removal.  Volume - chronic bilat diffuse LE edema w/ sig skin hardening, doubt this is reachable w/ dialysis. Max UF 2-3 L. UF as tolerated.  Anemia of esrd - Hb 10s, follow. Get records.  Secondary hyperparathyroidism -  CCa in range, add on phos. Cont binders w/ meals DM2 on insulin   Stable for Discharge from nephrology standpoint.   Rita H. Brown NP-C 01/16/2024, 11:45 AM  [employer] [phone]   Seen and examined independently.  Agree with note and exam as documented above by physician extender and as noted here.  General adult female in bed in no acute distress HEENT normocephalic atraumatic extraocular movements intact sclera anicteric Neck supple trachea midline Lungs clear to auscultation bilaterally normal work of breathing at rest  Heart S1S2 no rub Abdomen soft nontender nondistended Extremities woody edema on right leg, left leg is wrapped Psych normal mood and affect Access LUE AVF with bruit and thrill    ESRD on HD TTS   Infected DM foot ulcer - abx and wound care per primary team   C dif - therapies per primary team   Chronic hypotension - on midodrine   Stable for discharge from a strictly renal standpoint.  Disposition per primary team   Estanislado Emms, MD 01/16/2024  12:35 PM

## 2024-01-16 NOTE — Progress Notes (Addendum)
 PROGRESS NOTE    [name]  Moses:[id number] DOB: 12/16/1970 DOA: 01/13/2024 PCP: Lorelei Pont, DO   Chief Complaint  Patient presents with   wound bleeding    Brief Narrative:   Amy Moses is a 53 y.o. female with medical history significant of Chronic wounds on her lower extremity right heel..  Patient does get dressing on the foot.  Last evening, patient noted that there was some discharge that was seeping through the wound.  Patient describes this as puslike.  Patient came to the ER for further evaluation.   There is no report of any fever vomiting diarrhea any spreading erythema increased pain.  Vitally stable in ER pending mri ankle. Medical eval is sought.     As a separate issue, patient reports 3 months of right-sided shoulder pain with movement.  After she had "pulled "the shoulder while lifting a jug.    Assessment & Plan:   Principal Problem:   Ulcerated, foot (HCC) Active Problems:   ESRD (end stage renal disease) on dialysis (HCC)   Shoulder pain   Hypotension   Skin ulcer of right heel, limited to breakdown of skin (HCC)  Infected chronic heel ulcer of the right foot/osteomyelitis -Seen in the past by Dr. Lajoyce Corners, and vascular surgery, status post lower extremity angiography, significant for occluded RCA, but otherwise no significant stenosis noted, no intervention. -Treated with prolonged antibiotic course, developed C. Difficile. -On IV vancomycin and cefepime, did discontinue cefepime given history of C. Difficile -Orthopedic Dr. Lajoyce Corners  input greatly appreciated, continue wearing the Prevalon boot with compression wrap, currently no indication for surgery, but once indicated in the future she will need to proceed with below-knee amputation.  For now no indication for IV antibiotics.  Obstructive sleep apnea Continue with CPAP   Obesity class I Body mass index is 26.79 kg/m.  C. difficile diarrhea -patient reports chronic diarrhea at baseline,  during recently, C. difficile was obtained, significant for positive antigen and toxin, will start on prolonged vancomycin taper.     Sacral pressure ulcer Will consult wound care regarding recommendation for pressure ulcer care  Anemia of end-stage renal disease -IV iron and Procrit per renal, at baseline   Chronic diastolic CHF Management with hemodialysis   ESRD (end stage renal disease) on dialysis [facility]) -neurology consulted, on hemodialysis TTS   Hypothyroidism -Continue with Synthroid   Mixed hyperlipidemia Continue with home statin  Type 2 diabetes mellitus with ESRD (end-stage renal disease) (HCC) Continue with insulin sliding scale  Hypotension Patient seems to have chronic hypotension.  Uses midodrine for same.  Continue midodrine.  Telemetry to document stability   Shoulder pain No acute finding on chest x-ray, will await further recommendations from Dr. Lajoyce Corners   Sacral pressure ulcer, POA -Continue with wound care  Pictures from 2/24, exam done with her RN Ranjita as female chaperone.      DVT prophylaxis: Heparin Code Status: Full Family Communication: Discussed with patient, none at bedside Disposition:   Status is: Inpatient    Consultants:  Renal Orthopedic  Subjective: She reports 4-5 loose BMs overnight.  Objective: Vitals:   01/16/24 0515 01/16/24 0833 01/16/24 1225 01/16/24 1230  BP:  (!) 96/55 109/61   Pulse:  70 71 71  Resp:      Temp:  (!) 97.4 F (36.3 C)  (!) 97.4 F (36.3 C)  TempSrc:  Oral    SpO2:  97% 95% 98%  Weight: 75.3 kg     Height:  Intake/Output Summary (Last 24 hours) at 01/16/2024 1245 Last data filed at 01/16/2024 0834 Gross per 24 hour  Intake 674 ml  Output --  Net 674 ml    Filed Weights   01/13/24 0144 01/16/24 0515  Weight: 91.4 kg 75.3 kg    Examination:  Awake Alert, Oriented X 3, NAD Symmetrical Chest wall movement, Good air movement bilaterally, CTAB RRR,No Gallops,Rubs or new  Murmurs, No Parasternal Heave +ve B.Sounds, Abd Soft, No tenderness, No rebound - guarding or rigidity. No Cyanosis, Clubbing or edema, left lower extremity bandaged     Data Reviewed: I have personally reviewed following labs and imaging studies  CBC: Recent Labs  Lab 01/13/24 0152 01/14/24 0547 01/15/24 0459  WBC 8.5 7.7 7.1  HGB 10.5* 9.6* 9.9*  HCT 33.4* 30.5* 31.3*  MCV 92.8 90.5 91.8  PLT 151 142* 142*    Basic Metabolic Panel: Recent Labs  Lab 01/13/24 0152 01/14/24 0547 01/15/24 0459  NA 140 132* 133*  K 5.5* 4.1 4.8  CL 99 94* 98  CO2 21* 21* 21*  GLUCOSE 124* 243* 199*  BUN 50* 35* 50*  CREATININE 9.41* 6.59* 7.83*  CALCIUM 9.4 8.3* 8.4*  PHOS  --  5.9*  --     GFR: Estimated Creatinine Clearance: 8.9 mL/min (A) (by C-G formula based on SCr of 7.83 mg/dL (H)).  Liver Function Tests: Recent Labs  Lab 01/13/24 0152  AST 11*  ALT 11  ALKPHOS 116  BILITOT 0.8  PROT 6.9  ALBUMIN 3.6    CBG: Recent Labs  Lab 01/15/24 1242 01/15/24 1711 01/15/24 2216 01/16/24 0832 01/16/24 1231  GLUCAP 169* 138* 206* 162* 151*     Recent Results (from the past 240 hours)  Blood culture (routine x 2)     Status: None (Preliminary result)   Collection Time: 01/13/24  5:50 AM   Specimen: BLOOD RIGHT FOREARM  Result Value Ref Range Status   Specimen Description BLOOD RIGHT FOREARM  Final   Special Requests   Final    BOTTLES DRAWN AEROBIC AND ANAEROBIC Blood Culture results may not be optimal due to an inadequate volume of blood received in culture bottles   Culture   Final    NO GROWTH 3 DAYS Performed at [facility], [address] [place], [place] [address]    Report Status PENDING  Incomplete  Blood culture (routine x 2)     Status: None (Preliminary result)   Collection Time: 01/13/24  6:22 AM   Specimen: BLOOD RIGHT HAND  Result Value Ref Range Status   Specimen Description BLOOD RIGHT HAND  Final   Special Requests   Final    BOTTLES  DRAWN AEROBIC ONLY Blood Culture adequate volume   Culture   Final    NO GROWTH 3 DAYS Performed at [facility], [address] [place], [place] [address]    Report Status PENDING  Incomplete  C Difficile Quick Screen w PCR reflex     Status: Abnormal   Collection Time: 01/14/24 11:18 PM   Specimen: STOOL  Result Value Ref Range Status   C Diff antigen POSITIVE (A) NEGATIVE Final   C Diff toxin POSITIVE (A) NEGATIVE Final   C Diff interpretation Toxin producing C. difficile detected.  Final    Comment: RESULT CALLED TO, READ BACK BY AND VERIFIED WITH: D MELAND RN 01/15/2024 @ 0113 BY AB Performed at [name], [address], [place], [place] [address]  Radiology Studies: No results found.       Scheduled Meds:  atorvastatin  10 mg Oral QPM   Chlorhexidine Gluconate Cloth  6 each Topical Q0600   Chlorhexidine Gluconate Cloth  6 each Topical Q0600   ferric citrate  630 mg Oral TID WC   heparin  5,000 Units Subcutaneous Q8H   insulin aspart  0-5 Units Subcutaneous QHS   insulin aspart  0-9 Units Subcutaneous TID WC   insulin glargine  5 Units Subcutaneous Daily   levothyroxine  75 mcg Oral Daily   midodrine  10 mg Oral Daily   midodrine  10 mg Oral Q T,Th,Sa-HD   sodium chloride flush  3 mL Intravenous Q12H   vancomycin  125 mg Oral TID AC & HS   Followed by   Melene Muller ON 01/29/2024] vancomycin  125 mg Oral BID   Followed by   Melene Muller ON 02/05/2024] vancomycin  125 mg Oral Daily   Followed by   Melene Muller ON 02/12/2024] vancomycin  125 mg Oral QODAY   Followed by   Melene Muller ON 03/11/2024] vancomycin  125 mg Oral Q3 days   Continuous Infusions:  vancomycin       LOS: 3 days      Huey Bienenstock, MD [facility]   To contact the attending provider between 7A-7P or the covering provider during after hours 7P-7A, please log into the web site [email or web address] and access using universal [place] password for that web site. If you do not  have the password, please call the hospital operator.  01/16/2024, 12:45 PM

## 2024-01-16 NOTE — Consult Note (Signed)
 WOC Nurse Consult Note: Reason for Consult: requested to assess pressure injuries on sacrum/buttock. Consult performed remotely after photos and notes. Wound type: Pressure injury stage 3.  Right buttock - white skin tissue, old scar. Left Buttock - 2 wounds aprox. 3 x 2.5 cm  Pressure Injury POA: Yes Measurement: aprox. 3 x 2.5 cm Wound bed: 100% red pale.  Periwound: intact, white skin, old scar, healed tissue. Dressing procedure/placement/frequency: Left buttock: Apply Medihoney to the wound bed, change daily. Cover with foam dressing, is ok to lift the foam and reaply the Medihoney, change every 3 days or PRN soiling. Cover with foam dressing both sides to prevent further injuries.  WOC team will not plan to follow further.  Please reconsult if further assistance is needed. Thank-you,  Denyse Amass BSN, RN, [employer], WOC  (Pager: [phone])

## 2024-01-16 NOTE — Plan of Care (Signed)

## 2024-01-16 NOTE — Evaluation (Signed)
 Occupational Therapy Evaluation Patient Details Name: Amy Moses MRN: [id number] DOB: 11-17-1971 Today's Date: 01/16/2024   History of Present Illness   Amy Moses is a 53 y.o. female admitted 2/22 with right heel ulcer.  Pt w/u for right BKA in future if medical management doesnt work.  Cdiff. WUJ:WJXBJYN wounds on her lower extremity right heel, ESRD, HD T,TH,Sat; sacral ulcer     Clinical Impressions PTA, pt lives with family and reports typically Modified Independent with ADLs and transfers to/from wheelchair (scoot transfer vs sliding board). Pt reports no concerns with ADL/transfer mgmt though politely declined to attempt EOB/OOB activities this AM d/t ongoing diarrhea. Pt does endorse sacral skin integrity concerns, would benefit from air cushion for optimal pressure relief given reliance on wheelchair for mobility. Will follow up for one more OT session to attempt OOB ADLs as tolerated. Pt denies need for OT follow up at DC.      If plan is discharge home, recommend the following:   A little help with bathing/dressing/bathroom;Assistance with cooking/housework     Functional Status Assessment   Patient has had a recent decline in their functional status and demonstrates the ability to make significant improvements in function in a reasonable and predictable amount of time.     Equipment Recommendations   None recommended by OT     Recommendations for Other Services         Precautions/Restrictions   Precautions Precautions: Fall Restrictions Weight Bearing Restrictions Per Provider Order: No Other Position/Activity Restrictions: but pt reports MD told her to "keep the prevalon boot on and keep weight off of R heel" due to wound     Mobility Bed Mobility               General bed mobility comments: declined    Transfers                   General transfer comment: declined      Balance                                            ADL either performed or assessed with clinical judgement   ADL Overall ADL's : Needs assistance/impaired Eating/Feeding: Independent   Grooming: Set up;Bed level;Wash/dry face;Oral care Grooming Details (indicate cue type and reason): seated in bed, declined EOB or OOB Upper Body Bathing: Set up;Bed level   Lower Body Bathing: Minimal assistance;Bed level   Upper Body Dressing : Set up;Bed level   Lower Body Dressing: Bed level;Moderate assistance       Toileting- Clothing Manipulation and Hygiene: Maximal assistance;Bed level Toileting - Clothing Manipulation Details (indicate cue type and reason): reports using bedpan d/t diarrhea, unable to locate drop arm BSC       General ADL Comments: Limited due to pt declining EOB or OOB due to concern of causing diarrhea with movement. Pt denies concerns with ADL mgmt, appears home setup and DME functional for pt at home. Pt does report some sores on bottom - noted with foam cushion for wheelchair. educated on different levels of cushion support, pressure relief and plan to ask CM if pt would qualify for another cushion     Vision Ability to See in Adequate Light: 0 Adequate Patient Visual Report: No change from baseline Vision Assessment?: No apparent visual deficits Additional Comments: R eye droop but baseline for pt  Perception         Praxis         Pertinent Vitals/Pain Pain Assessment Pain Assessment: No/denies pain     Extremity/Trunk Assessment Upper Extremity Assessment Upper Extremity Assessment: Right hand dominant;RUE deficits/detail RUE Deficits / Details: 3rd finger with flexed position- pt reports having trigger finger and followed by a hand surgeon   Lower Extremity Assessment Lower Extremity Assessment: Defer to PT evaluation   Cervical / Trunk Assessment Cervical / Trunk Assessment: Normal   Communication Communication Communication: No apparent difficulties   Cognition  Arousal: Alert Behavior During Therapy: WFL for tasks assessed/performed Cognition: No apparent impairments                               Following commands: Intact       Cueing  General Comments   Cueing Techniques: Verbal cues      Exercises     Shoulder Instructions      Home Living Family/patient expects to be discharged to:: Private residence Living Arrangements: Spouse/significant other Available Help at Discharge: Family Type of Home: House Home Access: Ramped entrance     Home Layout: One level     Bathroom Shower/Tub: Sponge bathes at baseline   [employer]: Standard     Home Equipment: Wheelchair - manual;Hospital bed;Other (comment);BSC/3in1;Adaptive equipment (sliding board, drop arm BSC) Adaptive Equipment: Reacher;Sock aid Additional Comments: Has a home health nurse who changes BLE dressings 2x/week.      Prior Functioning/Environment Prior Level of Function : Independent/Modified Independent             Mobility Comments: lateral scoots to/from wheelchair; has transfer board for when surface height varies ADLs Comments: reports Modified Independent with ADLs, sponge bathes at baseline due to BLE wraps. Reports she does as much as she can for ADLs/household IADLs    OT Problem List: Decreased strength;Decreased activity tolerance   OT Treatment/Interventions: Therapeutic exercise;Self-care/ADL training;Energy conservation;DME and/or AE instruction;Therapeutic activities;Patient/family education;Balance training      OT Goals(Current goals can be found in the care plan section)   Acute Rehab OT Goals Patient Stated Goal: resolve diarrhea, be able to get a better wheelchair cushion OT Goal Formulation: With patient Time For Goal Achievement: 01/30/24 Potential to Achieve Goals: Fair   OT Frequency:  Min 1X/week    Co-evaluation              AM-PAC OT "6 Clicks" Daily Activity     Outcome Measure Help from  another person eating meals?: None Help from another person taking care of personal grooming?: A Little Help from another person toileting, which includes using toliet, bedpan, or urinal?: A Lot Help from another person bathing (including washing, rinsing, drying)?: A Little Help from another person to put on and taking off regular upper body clothing?: A Little Help from another person to put on and taking off regular lower body clothing?: A Lot 6 Click Score: 17   End of Session    Activity Tolerance: Other (comment) (self limited due to ongoing diarrheal issues) Patient left: in bed;with call bell/phone within reach  OT Visit Diagnosis: Muscle weakness (generalized) (M62.81)                Time: 1610-9604 OT Time Calculation (min): 22 min Charges:  OT General Charges $OT Visit: 1 Visit OT Evaluation $OT Eval Low Complexity: 1 Low  Bradd Canary, OTR/L Acute Rehab Services Office: [phone]  Lorre Munroe 01/16/2024, 10:06 AM

## 2024-01-16 NOTE — Procedures (Signed)
 Received patient in bed to unit.  Alert and oriented.  Informed consent signed and in chart.   TX duration:3 hrs 45 min  Patient tolerated well.  Transported back to the room  Alert, without acute distress.  Hand-off given to patient's nurse.   Access used: left avf Access issues: none  Total UF removed: 3 liters Medication(s) given: vancomycin   Lu Duffel, RN [facility]

## 2024-01-17 DIAGNOSIS — N186 End stage renal disease: Secondary | ICD-10-CM | POA: Diagnosis not present

## 2024-01-17 DIAGNOSIS — L97519 Non-pressure chronic ulcer of other part of right foot with unspecified severity: Secondary | ICD-10-CM

## 2024-01-17 DIAGNOSIS — L97411 Non-pressure chronic ulcer of right heel and midfoot limited to breakdown of skin: Secondary | ICD-10-CM | POA: Diagnosis not present

## 2024-01-17 DIAGNOSIS — E861 Hypovolemia: Secondary | ICD-10-CM | POA: Diagnosis not present

## 2024-01-17 DIAGNOSIS — Z992 Dependence on renal dialysis: Secondary | ICD-10-CM

## 2024-01-17 LAB — GLUCOSE, CAPILLARY
Glucose-Capillary: 168 mg/dL — ABNORMAL HIGH (ref 70–99)
Glucose-Capillary: 176 mg/dL — ABNORMAL HIGH (ref 70–99)
Glucose-Capillary: 150 mg/dL — ABNORMAL HIGH (ref 70–99)
Glucose-Capillary: 246 mg/dL — ABNORMAL HIGH (ref 70–99)

## 2024-01-17 MED ORDER — HYDROCERIN EX CREA
TOPICAL_CREAM | Freq: Two times a day (BID) | CUTANEOUS | Status: DC
  Administered 2024-01-19: 1 via TOPICAL
  Filled 2024-01-17 (×2): qty 113

## 2024-01-17 NOTE — Plan of Care (Signed)

## 2024-01-17 NOTE — Progress Notes (Signed)
 Physical Therapy Treatment Patient Details Name: Amy Moses MRN: [id number] DOB: 04-13-1971 Today's Date: 01/17/2024   History of Present Illness Amy Moses is a 53 y.o. female admitted 2/22 with right heel ulcer.  Pt w/u for right BKA in future if medical management doesnt work.  Cdiff. WGN:[id number] wounds on her lower extremity right heel, ESRD, HD T,TH,Sat; sacral ulcer    PT Comments  Pt seen for PT tx with pt agreeable. Pt politely declines getting OOB 2/2 just finished eating & with c-diff, anticipating needing to use restroom soon; PT encouraged pt to get OOB later with nursing staff & pt agreeable. Pt performed bed level exercises with cuing for technique PRN, AAROM LLE PRN. Pt c/o feeling swimmy headed at end of session, BP in RUE 92/49 mmHg MAP 62 - nurse made aware. Will continue to follow pt acutely to progress OOB mobility as able.    If plan is discharge home, recommend the following: A little help with walking and/or transfers;A little help with bathing/dressing/bathroom;Assistance with cooking/housework;Assist for transportation;Help with stairs or ramp for entrance   Can travel by private vehicle        Equipment Recommendations  None recommended by PT    Recommendations for Other Services       Precautions / Restrictions Precautions Precautions: Fall Restrictions Weight Bearing Restrictions Per Provider Order: No Other Position/Activity Restrictions: but pt reports MD told her to "keep the prevalon boot on and keep weight off of R heel" due to wound     Mobility  Bed Mobility                    Transfers                        Ambulation/Gait                   Stairs             Wheelchair Mobility     Tilt Bed    Modified Rankin (Stroke Patients Only)       Balance                                            Communication Communication Communication: No apparent difficulties   Cognition Arousal: Alert Behavior During Therapy: WFL for tasks assessed/performed   PT - Cognitive impairments: No apparent impairments                         Following commands: Intact      Cueing    Exercises General Exercises - Lower Extremity Heel Slides: AROM, Both, 10 reps, Supine Hip ABduction/ADduction: AROM, AAROM, Supine, Strengthening, Both, 10 reps (hip abduction AAROM LLE, AROM RLE x 10, hip adduction pillow squeezes x 10) Straight Leg Raises: AROM, AAROM, Supine, Strengthening, Both, 10 reps (AAROM LLE)    General Comments        Pertinent Vitals/Pain Pain Assessment Pain Assessment: Faces Faces Pain Scale: Hurts little more Pain Location: L knee Pain Descriptors / Indicators: Discomfort, Grimacing (chronic symptom) Pain Intervention(s): Monitored during session, Limited activity within patient's tolerance    Home Living                          Prior Function  PT Goals (current goals can now be found in the care plan section) Acute Rehab PT Goals Patient Stated Goal: to go home PT Goal Formulation: With patient Time For Goal Achievement: 01/29/24 Potential to Achieve Goals: Good Progress towards PT goals: Progressing toward goals    Frequency    Min 1X/week      PT Plan      Co-evaluation              AM-PAC PT "6 Clicks" Mobility   Outcome Measure  Help needed turning from your back to your side while in a flat bed without using bedrails?: None Help needed moving from lying on your back to sitting on the side of a flat bed without using bedrails?: A Little Help needed moving to and from a bed to a chair (including a wheelchair)?: A Lot Help needed standing up from a chair using your arms (e.g., wheelchair or bedside chair)?: Total Help needed to walk in hospital room?: Total Help needed climbing 3-5 steps with a railing? : Total 6 Click Score: 12    End of Session   Activity Tolerance:   (limited 2/2 feeling "swimmy headed" & low BP) Patient left: in bed;with call bell/phone within reach;with bed alarm set (BLE prevalon boots donned) Nurse Communication: Mobility status PT Visit Diagnosis: Muscle weakness (generalized) (M62.81)     Time: 4540-9811 PT Time Calculation (min) (ACUTE ONLY): 18 min  Charges:    $Therapeutic Activity: 8-22 mins PT General Charges $$ ACUTE PT VISIT: 1 Visit                     Aleda Grana, PT, DPT 01/17/24, 9:28 AM   Sandi Mariscal 01/17/2024, 9:26 AM

## 2024-01-17 NOTE — Progress Notes (Signed)
 PROGRESS NOTE        PATIENT DETAILS Name: Amy Moses Age: 53 y.o. Sex: female Date of Birth: 08/15/1971 Admit Date: 01/13/2024 Admitting Physician Nolberto Hanlon, MD [id number], Loraine Leriche, DO  Brief Summary: Patient is a 53 y.o.  female with history of ESRD on HD TTS, chronic right heel ulcer with chronic osteomyelitis of right cuboid bone, C. difficile colitis, chronic HFpEF, hypothyroidism, DM-2 presented with possible purulent discharge from a right hip wound-and ongoing diarrhea.  She was subsequently admitted to the hospitalist service.  See below for further details.  Significant events: 2/22>> admit to [facility]  Significant studies: 2/22>> MRI right heel: Ongoing osteomyelitis of the plantar aspect of the cuboid.  Significant microbiology data: 2/23>> stool C. difficile toxin/antigen: Positive 2/22>> blood culture: No growth  Procedures: None  Consults: Nephrology Orthopedics  Subjective: Lying comfortably in bed-denies any chest pain or shortness of breath.  Still continues to have loose watery stools.  Objective: Vitals: Blood pressure (!) 92/49, pulse 71, temperature (!) 96.7 F (35.9 C), temperature source Oral, resp. rate 14, height 5\' 6"  (1.676 m), weight 88.1 kg, last menstrual period 08/22/2015, SpO2 98%.   Exam: Gen Exam:Alert awake-not in any distress HEENT:atraumatic, normocephalic Chest: B/L clear to auscultation anteriorly CVS:S1S2 regular Abdomen:soft non tender, non distended Extremities:no edema Neurology: Non focal Skin: no rash  Pertinent Labs/Radiology:    Latest Ref Rng & Units 01/16/2024   12:56 PM 01/15/2024    4:59 AM 01/14/2024    5:47 AM  CBC  WBC 4.0 - 10.5 K/uL 7.5  7.1  7.7   Hemoglobin 12.0 - 15.0 g/dL 69.6  9.9  9.6   Hematocrit 36.0 - 46.0 % 33.0  31.3  30.5   Platelets 150 - 400 K/uL 151  142  142     Lab Results  Component Value Date   NA 131 (L) 01/16/2024   K 5.0 01/16/2024   CL 98 01/16/2024    CO2 21 (L) 01/16/2024     Assessment/Plan: Recurrent C. difficile colitis/diarrhea Recently completed treatment of C. difficile December 2024-seems to have reoccurred-plan is to do a 6-week course of oral vancomycin.  Continues to have diarrhea-although seems to be slowing down.  Chronic right heel ulcer with chronic osteomyelitis of the cuboid bone No infection on exam-no longer on IV antibiotics given C. difficile colitis Dr. Lajoyce Corners recommending supportive care/wound care-does not have any acute indications for surgery-however per Dr. Governor Specking indicated in the future-she will likely require a BKA  ESRD on HD TTS Nephrology following  Normocytic anemia Secondary to ESRD Iron/Aranesp per nephrology  DM-2 (A1c 9.0 on 08/02/2023) CBG stable SSI+ Lantus 5 units daily  Recent Labs    01/16/24 2219 01/17/24 0916 01/17/24 1131  GLUCAP 241* 168* 176*     Chronic HFpEF Volume status stable-being managed with hemodialysis.  HLD Statin  Chronic hypotension Midodrine-follow BP trend  Hypothyroidism Synthroid  OSA CPAP nightly  Stage III sacral decubitus ulcer: POA Wound care per WOC team.  Class 1 Obesity: Estimated body mass index is 31.35 kg/m as calculated from the following:   Height as of this encounter: 5\' 6"  (1.676 m).   Weight as of this encounter: 88.1 kg.   Code status:   Code Status: Full Code   DVT Prophylaxis: heparin injection 5,000 Units Start: 01/14/24 0600 SCDs Start: 01/13/24 2952   Family  Communication: None at bedside   Disposition Plan: Status is: Inpatient Remains inpatient appropriate because: Severity of illness   Planned Discharge Destination:Home   Diet: Diet Order             Diet renal/carb modified with fluid restriction Diet-HS Snack? Nothing; Fluid restriction: 1200 mL Fluid; Room service appropriate? Yes; Fluid consistency: Thin  Diet effective now                     Antimicrobial agents: Anti-infectives (From  admission, onward)    Start     Dose/Rate Route Frequency Ordered Stop   03/11/24 1000  vancomycin (VANCOCIN) capsule 125 mg       Placed in "Followed by" Linked Group   125 mg Oral Every 3 DAYS 01/15/24 0704 04/10/24 0959   02/12/24 1000  vancomycin (VANCOCIN) capsule 125 mg       Placed in "Followed by" Linked Group   125 mg Oral Every other day 01/15/24 0704 03/11/24 0959   02/05/24 1000  vancomycin (VANCOCIN) capsule 125 mg       Placed in "Followed by" Linked Group   125 mg Oral Daily 01/15/24 0704 02/12/24 0959   01/29/24 1000  vancomycin (VANCOCIN) capsule 125 mg       Placed in "Followed by" Linked Group   125 mg Oral 2 times daily 01/15/24 0704 02/05/24 0959   01/16/24 1200  vancomycin (VANCOCIN) IVPB 1000 mg/200 mL premix        1,000 mg 200 mL/hr over 60 Minutes Intravenous Every T-Th-Sa (Hemodialysis) 01/13/24 1004     01/15/24 0730  vancomycin (VANCOCIN) capsule 125 mg  Status:  Discontinued        125 mg Oral 3 times daily before meals & bedtime 01/15/24 0704 01/15/24 0707   01/15/24 0730  vancomycin (VANCOCIN) capsule 125 mg       Placed in "Followed by" Linked Group   125 mg Oral 3 times daily before meals & bedtime 01/15/24 0704 01/29/24 0629   01/15/24 0215  vancomycin (VANCOCIN) capsule 125 mg  Status:  Discontinued        125 mg Oral Every 6 hours 01/15/24 0122 01/15/24 1201   01/15/24 0215  vancomycin (VANCOCIN) capsule 125 mg  Status:  Discontinued        125 mg Oral 4 times daily 01/15/24 0122 01/15/24 0122   01/13/24 2130  vancomycin (VANCOCIN) IVPB 1000 mg/200 mL premix        1,000 mg 200 mL/hr over 60 Minutes Intravenous  Once 01/13/24 2116 01/14/24 0327   01/13/24 0800  vancomycin (VANCOREADY) IVPB 1750 mg/350 mL        1,750 mg 175 mL/hr over 120 Minutes Intravenous  Once 01/13/24 0727 01/13/24 1141   01/13/24 0730  ceFEPIme (MAXIPIME) 2 g in sodium chloride 0.9 % 100 mL IVPB        2 g 200 mL/hr over 30 Minutes Intravenous  Once 01/13/24 0725 01/13/24  0812   01/13/24 0730  vancomycin (VANCOCIN) IVPB 1000 mg/200 mL premix  Status:  Discontinued        1,000 mg 200 mL/hr over 60 Minutes Intravenous  Once 01/13/24 0725 01/13/24 0727        MEDICATIONS: Scheduled Meds:  atorvastatin  10 mg Oral QPM   Chlorhexidine Gluconate Cloth  6 each Topical Q0600   Chlorhexidine Gluconate Cloth  6 each Topical Q0600   ferric citrate  630 mg Oral TID WC   heparin  5,000 Units Subcutaneous Q8H   hydrocerin   Topical BID   insulin aspart  0-5 Units Subcutaneous QHS   insulin aspart  0-9 Units Subcutaneous TID WC   insulin glargine  5 Units Subcutaneous Daily   leptospermum manuka honey  1 Application Topical Daily   levothyroxine  75 mcg Oral Daily   midodrine  10 mg Oral Daily   midodrine  10 mg Oral Q T,Th,Sa-HD   sodium chloride flush  3 mL Intravenous Q12H   vancomycin  125 mg Oral TID AC & HS   Followed by   Melene Muller ON 01/29/2024] vancomycin  125 mg Oral BID   Followed by   Melene Muller ON 02/05/2024] vancomycin  125 mg Oral Daily   Followed by   Melene Muller ON 02/12/2024] vancomycin  125 mg Oral QODAY   Followed by   Melene Muller ON 03/11/2024] vancomycin  125 mg Oral Q3 days   Continuous Infusions:  vancomycin 1,000 mg (01/16/24 1748)   PRN Meds:.acetaminophen **OR** acetaminophen, ondansetron (ZOFRAN) IV, polyethylene glycol   I have personally reviewed following labs and imaging studies  LABORATORY DATA: CBC: Recent Labs  Lab 01/13/24 0152 01/14/24 0547 01/15/24 0459 01/16/24 1256  WBC 8.5 7.7 7.1 7.5  HGB 10.5* 9.6* 9.9* 10.4*  HCT 33.4* 30.5* 31.3* 33.0*  MCV 92.8 90.5 91.8 90.4  PLT 151 142* 142* 151    Basic Metabolic Panel: Recent Labs  Lab 01/13/24 0152 01/14/24 0547 01/15/24 0459 01/16/24 1256  NA 140 132* 133* 131*  K 5.5* 4.1 4.8 5.0  CL 99 94* 98 98  CO2 21* 21* 21* 21*  GLUCOSE 124* 243* 199* 144*  BUN 50* 35* 50* 63*  CREATININE 9.41* 6.59* 7.83* 8.85*  CALCIUM 9.4 8.3* 8.4* 8.7*  PHOS  --  5.9*  --  8.6*     GFR: Estimated Creatinine Clearance: 8.5 mL/min (A) (by C-G formula based on SCr of 8.85 mg/dL (H)).  Liver Function Tests: Recent Labs  Lab 01/13/24 0152 01/16/24 1256  AST 11*  --   ALT 11  --   ALKPHOS 116  --   BILITOT 0.8  --   PROT 6.9  --   ALBUMIN 3.6 3.2*   No results for input(s): "LIPASE", "AMYLASE" in the last 168 hours. No results for input(s): "AMMONIA" in the last 168 hours.  Coagulation Profile: Recent Labs  Lab 01/14/24 0547  INR 1.1    Cardiac Enzymes: No results for input(s): "CKTOTAL", "CKMB", "CKMBINDEX", "TROPONINI" in the last 168 hours.  BNP (last 3 results) No results for input(s): "PROBNP" in the last 8760 hours.  Lipid Profile: No results for input(s): "CHOL", "HDL", "LDLCALC", "TRIG", "CHOLHDL", "LDLDIRECT" in the last 72 hours.  Thyroid Function Tests: No results for input(s): "TSH", "T4TOTAL", "FREET4", "T3FREE", "THYROIDAB" in the last 72 hours.  Anemia Panel: No results for input(s): "VITAMINB12", "FOLATE", "FERRITIN", "TIBC", "IRON", "RETICCTPCT" in the last 72 hours.  Urine analysis: No results found for: "COLORURINE", "APPEARANCEUR", "LABSPEC", "PHURINE", "GLUCOSEU", "HGBUR", "BILIRUBINUR", "KETONESUR", "PROTEINUR", "UROBILINOGEN", "NITRITE", "LEUKOCYTESUR"  Sepsis Labs: Lactic Acid, Venous    Component Value Date/Time   LATICACIDVEN 0.9 01/13/2024 0550    MICROBIOLOGY: Recent Results (from the past 240 hours)  Blood culture (routine x 2)     Status: None (Preliminary result)   Collection Time: 01/13/24  5:50 AM   Specimen: BLOOD RIGHT FOREARM  Result Value Ref Range Status   Specimen Description BLOOD RIGHT FOREARM  Final   Special Requests   Final  BOTTLES DRAWN AEROBIC AND ANAEROBIC Blood Culture results may not be optimal due to an inadequate volume of blood received in culture bottles   Culture   Final    NO GROWTH 4 DAYS Performed at [facility], [address] [place], [place] [address]    Report  Status PENDING  Incomplete  Blood culture (routine x 2)     Status: None (Preliminary result)   Collection Time: 01/13/24  6:22 AM   Specimen: BLOOD RIGHT HAND  Result Value Ref Range Status   Specimen Description BLOOD RIGHT HAND  Final   Special Requests   Final    BOTTLES DRAWN AEROBIC ONLY Blood Culture adequate volume   Culture   Final    NO GROWTH 4 DAYS Performed at [facility], [address] [place], [place] [address]    Report Status PENDING  Incomplete  C Difficile Quick Screen w PCR reflex     Status: Abnormal   Collection Time: 01/14/24 11:18 PM   Specimen: STOOL  Result Value Ref Range Status   C Diff antigen POSITIVE (A) NEGATIVE Final   C Diff toxin POSITIVE (A) NEGATIVE Final   C Diff interpretation Toxin producing C. difficile detected.  Final    Comment: RESULT CALLED TO, READ BACK BY AND VERIFIED WITH: D MELAND RN 01/15/2024 @ 0113 BY AB Performed at [facility], [address] [place], [place] [address]     RADIOLOGY STUDIES/RESULTS: No results found.   LOS: 4 days   Jeoffrey Massed, MD  [facility]    To contact the attending provider between 7A-7P or the covering provider during after hours 7P-7A, please log into the web site [email or web address] and access using universal [place] password for that web site. If you do not have the password, please call the hospital operator.  01/17/2024, 2:05 PM

## 2024-01-17 NOTE — Progress Notes (Addendum)
 [facility] Progress Note   Subjective: Awake, eating lunch. Still having liquid stools. Says that she got up to work with PT and because dizzy. SBP 90s. We discussed that current midodrine dose is actually higher than on admission. Will continue this dose for now. HD tomorrow on schedule. Net UF 3 liters 01/16/2024  Objective Vitals:   01/17/24 0049 01/17/24 0524 01/17/24 0525 01/17/24 0920  BP:  95/60  (!) 92/49  Pulse: 72 72 74 71  Resp: 13  15 14   Temp: 98 F (36.7 C)  (!) 96.7 F (35.9 C)   TempSrc: Oral  Oral   SpO2: 99% 99% 95% 98%  Weight:      Height:       Physical Exam General: Chronically ill appearing female in NAD Heart: S1,S2 No M/R/G Lungs: CTAB. No WOB Abdomen: NABS, NT Extremities: Woody appearance BLE + edema  Dialysis Access: L AVF + T/B   Additional Objective Labs: Basic Metabolic Panel: Recent Labs  Lab 01/14/24 0547 01/15/24 0459 01/16/24 1256  NA 132* 133* 131*  K 4.1 4.8 5.0  CL 94* 98 98  CO2 21* 21* 21*  GLUCOSE 243* 199* 144*  BUN 35* 50* 63*  CREATININE 6.59* 7.83* 8.85*  CALCIUM 8.3* 8.4* 8.7*  PHOS 5.9*  --  8.6*   Liver Function Tests: Recent Labs  Lab 01/13/24 0152 01/16/24 1256  AST 11*  --   ALT 11  --   ALKPHOS 116  --   BILITOT 0.8  --   PROT 6.9  --   ALBUMIN 3.6 3.2*   No results for input(s): "LIPASE", "AMYLASE" in the last 168 hours. CBC: Recent Labs  Lab 01/13/24 0152 01/14/24 0547 01/15/24 0459 01/16/24 1256  WBC 8.5 7.7 7.1 7.5  HGB 10.5* 9.6* 9.9* 10.4*  HCT 33.4* 30.5* 31.3* 33.0*  MCV 92.8 90.5 91.8 90.4  PLT 151 142* 142* 151   Blood Culture    Component Value Date/Time   SDES BLOOD RIGHT HAND 01/13/2024 0622   SPECREQUEST  01/13/2024 0622    BOTTLES DRAWN AEROBIC ONLY Blood Culture adequate volume   CULT  01/13/2024 0622    NO GROWTH 4 DAYS Performed at [facility], [address], [place], [place] [address]    REPTSTATUS PENDING 01/13/2024 6045    Cardiac  Enzymes: No results for input(s): "CKTOTAL", "CKMB", "CKMBINDEX", "TROPONINI" in the last 168 hours. CBG: Recent Labs  Lab 01/16/24 1231 01/16/24 1943 01/16/24 2219 01/17/24 0916 01/17/24 1131  GLUCAP 151* 168* 241* 168* 176*   Iron Studies: No results for input(s): "IRON", "TIBC", "TRANSFERRIN", "FERRITIN" in the last 72 hours. @lablastinr3 @ Studies/Results: No results found. Medications:  vancomycin 1,000 mg (01/16/24 1748)    atorvastatin  10 mg Oral QPM   Chlorhexidine Gluconate Cloth  6 each Topical Q0600   Chlorhexidine Gluconate Cloth  6 each Topical Q0600   ferric citrate  630 mg Oral TID WC   heparin  5,000 Units Subcutaneous Q8H   insulin aspart  0-5 Units Subcutaneous QHS   insulin aspart  0-9 Units Subcutaneous TID WC   insulin glargine  5 Units Subcutaneous Daily   leptospermum manuka honey  1 Application Topical Daily   levothyroxine  75 mcg Oral Daily   midodrine  10 mg Oral Daily   midodrine  10 mg Oral Q T,Th,Sa-HD   sodium chloride flush  3 mL Intravenous Q12H   vancomycin  125 mg Oral TID AC & HS   Followed  by   Melene Muller ON 01/29/2024] vancomycin  125 mg Oral BID   Followed by   Melene Muller ON 02/05/2024] vancomycin  125 mg Oral Daily   Followed by   Melene Muller ON 02/12/2024] vancomycin  125 mg Oral QODAY   Followed by   Melene Muller ON 03/11/2024] vancomycin  125 mg Oral Q3 days     D[place] Orders: [place] [facility] [place] -->  3h  91.5kg  2K bath  B400  LUA AVF  Heparin none    Renal-related home meds: - midodrine 10mg   - auryxia 3 ac tid - others: PPI, insulins, vit D, T4, statin      Assessment/Plan: Infected diabetic foot ulcer - started on IV vanc and cefepime. Per pmd. Patient was seen by Dr. Lajoyce Corners and Vascular in the past. S/p lower extremity angiography which apparently showed significant occluded RCA, but otherwise no significant stenosis noted. Seen by Dr. Lajoyce Corners today. No surgical intervention planned.  C diff diarrhea-per  primary ESRD - on HD TTS. Received HD overnight 2.5L off. Next HD 01/18/2024. Chronic hypotension -continue midodrine 10 mg PO daily per home order but added additional midodrine doses on HD days. Was symptomatic with hypotension earlier today prior to receiving midodrine, Improved now.  Volume - Chronic woody edema BLE but volume status seems acceptable. Now under OP EDW. Will need EDW adjusted on discharge.  UF as tolerated.  Anemia of esrd - Hb 10s, follow. Get records.  Secondary hyperparathyroidism - CCa in range, add on phos. Cont binders w/ meals DM2 on insulin  Rita H. Brown NP-C 01/17/2024, 12:09 PM  [facility] [phone]   Seen and examined independently.  Agree with note and exam as documented above by physician extender and as noted here.  Diarrhea is better today and so is nausea.  Dizzy today before getting her midodrine while working with therapy   General adult female in bed in no acute distress HEENT normocephalic atraumatic extraocular movements intact sclera anicteric Neck supple trachea midline Lungs clear to auscultation bilaterally normal work of breathing at rest on room air Heart S1S2 no rub Abdomen soft nontender nondistended Extremities trace woody edema on right leg, left leg is wrapped Psych normal mood and affect Neuro - alert and conversant; provides hx and follows commands Access LUE AVF with bruit and thrill    ESRD on HD TTS. Ordered renal panel daily starting tomorrow AM   Infected DM foot ulcer - abx and wound care per primary team    C dif - therapies per primary team    Chronic hypotension - on midodrine    Anemia of CKD - Hb acceptable  Disposition per primary team   Estanislado Emms, MD 01/17/2024  1:51 PM

## 2024-01-18 DIAGNOSIS — L97519 Non-pressure chronic ulcer of other part of right foot with unspecified severity: Secondary | ICD-10-CM | POA: Diagnosis not present

## 2024-01-18 DIAGNOSIS — N186 End stage renal disease: Secondary | ICD-10-CM | POA: Diagnosis not present

## 2024-01-18 DIAGNOSIS — E861 Hypovolemia: Secondary | ICD-10-CM | POA: Diagnosis not present

## 2024-01-18 DIAGNOSIS — L97411 Non-pressure chronic ulcer of right heel and midfoot limited to breakdown of skin: Secondary | ICD-10-CM | POA: Diagnosis not present

## 2024-01-18 LAB — RENAL FUNCTION PANEL
Albumin: 3.1 g/dL — ABNORMAL LOW (ref 3.5–5.0)
Anion gap: 14 (ref 5–15)
BUN: 59 mg/dL — ABNORMAL HIGH (ref 6–20)
CO2: 22 mmol/L (ref 22–32)
Calcium: 9.1 mg/dL (ref 8.9–10.3)
Chloride: 94 mmol/L — ABNORMAL LOW (ref 98–111)
Creatinine, Ser: 7.03 mg/dL — ABNORMAL HIGH (ref 0.44–1.00)
GFR, Estimated: 7 mL/min — ABNORMAL LOW (ref >60)
Glucose, Bld: 171 mg/dL — ABNORMAL HIGH (ref 70–99)
Phosphorus: 7.4 mg/dL — ABNORMAL HIGH (ref 2.5–4.6)
Potassium: 4.9 mmol/L (ref 3.5–5.1)
Sodium: 130 mmol/L — ABNORMAL LOW (ref 135–145)

## 2024-01-18 LAB — CULTURE, BLOOD (ROUTINE X 2)
Culture: NO GROWTH
Culture: NO GROWTH
Special Requests: ADEQUATE

## 2024-01-18 LAB — CBC
HCT: 32.8 % — ABNORMAL LOW (ref 36.0–46.0)
Hemoglobin: 10.3 g/dL — ABNORMAL LOW (ref 12.0–15.0)
MCH: 28.8 pg (ref 26.0–34.0)
MCHC: 31.4 g/dL (ref 30.0–36.0)
MCV: 91.6 fL (ref 80.0–100.0)
Platelets: 141 10*3/uL — ABNORMAL LOW (ref 150–400)
RBC: 3.58 MIL/uL — ABNORMAL LOW (ref 3.87–5.11)
RDW: 14.7 % (ref 11.5–15.5)
WBC: 8.2 10*3/uL (ref 4.0–10.5)
nRBC: 0 % (ref 0.0–0.2)

## 2024-01-18 LAB — GLUCOSE, CAPILLARY
Glucose-Capillary: 174 mg/dL — ABNORMAL HIGH (ref 70–99)
Glucose-Capillary: 229 mg/dL — ABNORMAL HIGH (ref 70–99)

## 2024-01-18 MED ORDER — PENTAFLUOROPROP-TETRAFLUOROETH EX AERO: 1.0000 | INHALATION_SPRAY | CUTANEOUS | Status: DC | PRN

## 2024-01-18 MED ORDER — LIDOCAINE HCL (PF) 1 % IJ SOLN: 5.0000 mL | INTRAMUSCULAR | Status: DC | PRN

## 2024-01-18 MED ORDER — LIDOCAINE-PRILOCAINE 2.5-2.5 % EX CREA: 1.0000 | TOPICAL_CREAM | CUTANEOUS | Status: DC | PRN

## 2024-01-18 MED ORDER — HEPARIN SODIUM (PORCINE) 1000 UNIT/ML DIALYSIS: 1000.0000 [IU] | INTRAMUSCULAR | Status: DC | PRN

## 2024-01-18 NOTE — Care Plan (Cosign Needed)
 Performed Head to toe assessment. Pt has impaired skin integrity related to reduced mobility. Pt stated they are wheel chair bound, has a weak right arm, and no sensations in both feet. Impaired skin integrity evidenced by skin break down found on the lower but most likely caused by friction of opposing skin surfaces. Plan is to turn pt every 2hrs, keep skin folds dry, and reduce moisture.

## 2024-01-18 NOTE — TOC CM/SW Note (Signed)
 Transition of Care [facility]) - Inpatient Brief Assessment   Patient Details  Name: Amy Moses MRN: [id number] Date of Birth: July 23, 1971  Transition of Care [facility]) CM/SW Contact:    Mearl Latin, LCSW Phone Number: 01/18/2024, 9:37 AM   Clinical Narrative: Patient admitted from home in [place] with outpatient Dialysis and DME. Wheelchair ramp assistance resources provided at patient's request. TOC following for home health set up.    Transition of Care Asessment: Insurance and Status: Insurance coverage has been reviewed Patient has primary care physician: Yes Home environment has been reviewed: From home Prior level of function:: Modified independent Prior/Current Home Services: No current home services Social Drivers of Health Review: SDOH reviewed no interventions necessary Readmission risk has been reviewed: Yes Transition of care needs: transition of care needs identified, TOC will continue to follow

## 2024-01-18 NOTE — Progress Notes (Signed)
 PROGRESS NOTE        PATIENT DETAILS Name: Amy Moses Age: 53 y.o. Sex: female Date of Birth: 02/05/1971 Admit Date: 01/13/2024 Admitting Physician Nolberto Hanlon, MD [id number], Loraine Leriche, DO  Brief Summary: Patient is a 53 y.o.  female with history of ESRD on HD TTS, chronic right heel ulcer with chronic osteomyelitis of right cuboid bone, C. difficile colitis, chronic HFpEF, hypothyroidism, DM-2 presented with possible purulent discharge from a right hip wound-and ongoing diarrhea.  She was subsequently admitted to the hospitalist service.  See below for further details.  Significant events: 2/22>> admit to [facility]  Significant studies: 2/22>> MRI right heel: Ongoing osteomyelitis of the plantar aspect of the cuboid.  Significant microbiology data: 2/23>> stool C. difficile toxin/antigen: Positive 2/22>> blood culture: No growth  Procedures: None  Consults: Nephrology Orthopedics  Subjective: Significant improvement in her diarrhea overnight-claims she has not had a bowel movement since yesterday evening.  Objective: Vitals: Blood pressure 113/62, pulse 65, temperature 98.1 F (36.7 C), resp. rate 13, height 5\' 6"  (1.676 m), weight 88.1 kg, last menstrual period 08/22/2015, SpO2 98%.   Exam: Gen Exam:Alert awake-not in any distress HEENT:atraumatic, normocephalic Chest: B/L clear to auscultation anteriorly CVS:S1S2 regular Abdomen:soft non tender, non distended Extremities:no edema Neurology: Non focal Skin: no rash  Pertinent Labs/Radiology:    Latest Ref Rng & Units 01/18/2024    8:36 AM 01/16/2024   12:56 PM 01/15/2024    4:59 AM  CBC  WBC 4.0 - 10.5 K/uL 8.2  7.5  7.1   Hemoglobin 12.0 - 15.0 g/dL 09.8  11.9  9.9   Hematocrit 36.0 - 46.0 % 32.8  33.0  31.3   Platelets 150 - 400 K/uL 141  151  142     Lab Results  Component Value Date   NA 130 (L) 01/18/2024   K 4.9 01/18/2024   CL 94 (L) 01/18/2024   CO2 22 01/18/2024      Assessment/Plan: Recurrent C. difficile colitis/diarrhea Recently completed treatment of C. difficile December 2024-reviewed GERD this admission Thankfully much improved-diarrhea is markedly better-no BM overnight Continue oral vancomycin taper x 6 weeks.   Chronic right heel ulcer with chronic osteomyelitis of the cuboid bone No infection on exam-no longer on IV antibiotics given C. difficile colitis Dr. Lajoyce Corners recommending supportive care/wound care-does not have any acute indications for surgery-however per Dr. Governor Specking indicated in the future-she will likely require a BKA  ESRD on HD TTS Nephrology following  Normocytic anemia Secondary to ESRD Iron/Aranesp per nephrology  DM-2 (A1c 9.0 on 08/02/2023) CBG stable SSI+ Lantus 5 units daily  Recent Labs    01/17/24 1131 01/17/24 1537 01/17/24 2128  GLUCAP 176* 150* 246*     Chronic HFpEF Volume status stable-being managed with hemodialysis.  HLD Statin  Chronic hypotension Midodrine-follow BP trend  Hypothyroidism Synthroid  OSA CPAP nightly  Stage III sacral decubitus ulcer: POA Wound care per WOC team.  Class 1 Obesity: Estimated body mass index is 31.35 kg/m as calculated from the following:   Height as of this encounter: 5\' 6"  (1.676 m).   Weight as of this encounter: 88.1 kg.   Code status:   Code Status: Full Code   DVT Prophylaxis: heparin injection 5,000 Units Start: 01/14/24 0600 SCDs Start: 01/13/24 1478   Family Communication: None at bedside.   Disposition Plan: Status is: Inpatient  Remains inpatient appropriate because: Severity of illness   Planned Discharge Destination:Home   Diet: Diet Order             Diet renal/carb modified with fluid restriction Diet-HS Snack? Nothing; Fluid restriction: 1200 mL Fluid; Room service appropriate? Yes; Fluid consistency: Thin  Diet effective now                     Antimicrobial agents: Anti-infectives (From admission, onward)     Start     Dose/Rate Route Frequency Ordered Stop   03/11/24 1000  vancomycin (VANCOCIN) capsule 125 mg       Placed in "Followed by" Linked Group   125 mg Oral Every 3 DAYS 01/15/24 0704 04/10/24 0959   02/12/24 1000  vancomycin (VANCOCIN) capsule 125 mg       Placed in "Followed by" Linked Group   125 mg Oral Every other day 01/15/24 0704 03/11/24 0959   02/05/24 1000  vancomycin (VANCOCIN) capsule 125 mg       Placed in "Followed by" Linked Group   125 mg Oral Daily 01/15/24 0704 02/12/24 0959   01/29/24 1000  vancomycin (VANCOCIN) capsule 125 mg       Placed in "Followed by" Linked Group   125 mg Oral 2 times daily 01/15/24 0704 02/05/24 0959   01/16/24 1200  vancomycin (VANCOCIN) IVPB 1000 mg/200 mL premix  Status:  Discontinued        1,000 mg 200 mL/hr over 60 Minutes Intravenous Every T-Th-Sa (Hemodialysis) 01/13/24 1004 01/17/24 1509   01/15/24 0730  vancomycin (VANCOCIN) capsule 125 mg  Status:  Discontinued        125 mg Oral 3 times daily before meals & bedtime 01/15/24 0704 01/15/24 0707   01/15/24 0730  vancomycin (VANCOCIN) capsule 125 mg       Placed in "Followed by" Linked Group   125 mg Oral 3 times daily before meals & bedtime 01/15/24 0704 01/29/24 0629   01/15/24 0215  vancomycin (VANCOCIN) capsule 125 mg  Status:  Discontinued        125 mg Oral Every 6 hours 01/15/24 0122 01/15/24 1201   01/15/24 0215  vancomycin (VANCOCIN) capsule 125 mg  Status:  Discontinued        125 mg Oral 4 times daily 01/15/24 0122 01/15/24 0122   01/13/24 2130  vancomycin (VANCOCIN) IVPB 1000 mg/200 mL premix        1,000 mg 200 mL/hr over 60 Minutes Intravenous  Once 01/13/24 2116 01/14/24 0327   01/13/24 0800  vancomycin (VANCOREADY) IVPB 1750 mg/350 mL        1,750 mg 175 mL/hr over 120 Minutes Intravenous  Once 01/13/24 0727 01/13/24 1141   01/13/24 0730  ceFEPIme (MAXIPIME) 2 g in sodium chloride 0.9 % 100 mL IVPB        2 g 200 mL/hr over 30 Minutes Intravenous  Once  01/13/24 0725 01/13/24 0812   01/13/24 0730  vancomycin (VANCOCIN) IVPB 1000 mg/200 mL premix  Status:  Discontinued        1,000 mg 200 mL/hr over 60 Minutes Intravenous  Once 01/13/24 0725 01/13/24 0727        MEDICATIONS: Scheduled Meds:  atorvastatin  10 mg Oral QPM   Chlorhexidine Gluconate Cloth  6 each Topical Q0600   Chlorhexidine Gluconate Cloth  6 each Topical Q0600   ferric citrate  630 mg Oral TID WC   heparin  5,000 Units Subcutaneous Q8H   hydrocerin  Topical BID   insulin aspart  0-5 Units Subcutaneous QHS   insulin aspart  0-9 Units Subcutaneous TID WC   insulin glargine  5 Units Subcutaneous Daily   leptospermum manuka honey  1 Application Topical Daily   levothyroxine  75 mcg Oral Daily   midodrine  10 mg Oral Daily   midodrine  10 mg Oral Q T,Th,Sa-HD   sodium chloride flush  3 mL Intravenous Q12H   vancomycin  125 mg Oral TID AC & HS   Followed by   Melene Muller ON 01/29/2024] vancomycin  125 mg Oral BID   Followed by   Melene Muller ON 02/05/2024] vancomycin  125 mg Oral Daily   Followed by   Melene Muller ON 02/12/2024] vancomycin  125 mg Oral QODAY   Followed by   Melene Muller ON 03/11/2024] vancomycin  125 mg Oral Q3 days   Continuous Infusions:   PRN Meds:.acetaminophen **OR** acetaminophen, heparin, lidocaine (PF), lidocaine-prilocaine, ondansetron (ZOFRAN) IV, pentafluoroprop-tetrafluoroeth, polyethylene glycol   I have personally reviewed following labs and imaging studies  LABORATORY DATA: CBC: Recent Labs  Lab 01/13/24 0152 01/14/24 0547 01/15/24 0459 01/16/24 1256 01/18/24 0836  WBC 8.5 7.7 7.1 7.5 8.2  HGB 10.5* 9.6* 9.9* 10.4* 10.3*  HCT 33.4* 30.5* 31.3* 33.0* 32.8*  MCV 92.8 90.5 91.8 90.4 91.6  PLT 151 142* 142* 151 141*    Basic Metabolic Panel: Recent Labs  Lab 01/13/24 0152 01/14/24 0547 01/15/24 0459 01/16/24 1256 01/18/24 0523  NA 140 132* 133* 131* 130*  K 5.5* 4.1 4.8 5.0 4.9  CL 99 94* 98 98 94*  CO2 21* 21* 21* 21* 22  GLUCOSE  124* 243* 199* 144* 171*  BUN 50* 35* 50* 63* 59*  CREATININE 9.41* 6.59* 7.83* 8.85* 7.03*  CALCIUM 9.4 8.3* 8.4* 8.7* 9.1  PHOS  --  5.9*  --  8.6* 7.4*    GFR: Estimated Creatinine Clearance: 10.7 mL/min (A) (by C-G formula based on SCr of 7.03 mg/dL (H)).  Liver Function Tests: Recent Labs  Lab 01/13/24 0152 01/16/24 1256 01/18/24 0523  AST 11*  --   --   ALT 11  --   --   ALKPHOS 116  --   --   BILITOT 0.8  --   --   PROT 6.9  --   --   ALBUMIN 3.6 3.2* 3.1*   No results for input(s): "LIPASE", "AMYLASE" in the last 168 hours. No results for input(s): "AMMONIA" in the last 168 hours.  Coagulation Profile: Recent Labs  Lab 01/14/24 0547  INR 1.1    Cardiac Enzymes: No results for input(s): "CKTOTAL", "CKMB", "CKMBINDEX", "TROPONINI" in the last 168 hours.  BNP (last 3 results) No results for input(s): "PROBNP" in the last 8760 hours.  Lipid Profile: No results for input(s): "CHOL", "HDL", "LDLCALC", "TRIG", "CHOLHDL", "LDLDIRECT" in the last 72 hours.  Thyroid Function Tests: No results for input(s): "TSH", "T4TOTAL", "FREET4", "T3FREE", "THYROIDAB" in the last 72 hours.  Anemia Panel: No results for input(s): "VITAMINB12", "FOLATE", "FERRITIN", "TIBC", "IRON", "RETICCTPCT" in the last 72 hours.  Urine analysis: No results found for: "COLORURINE", "APPEARANCEUR", "LABSPEC", "PHURINE", "GLUCOSEU", "HGBUR", "BILIRUBINUR", "KETONESUR", "PROTEINUR", "UROBILINOGEN", "NITRITE", "LEUKOCYTESUR"  Sepsis Labs: Lactic Acid, Venous    Component Value Date/Time   LATICACIDVEN 0.9 01/13/2024 0550    MICROBIOLOGY: Recent Results (from the past 240 hours)  Blood culture (routine x 2)     Status: None   Collection Time: 01/13/24  5:50 AM   Specimen: BLOOD RIGHT FOREARM  Result Value Ref Range Status   Specimen Description BLOOD RIGHT FOREARM  Final   Special Requests   Final    BOTTLES DRAWN AEROBIC AND ANAEROBIC Blood Culture results may not be optimal due to an  inadequate volume of blood received in culture bottles   Culture   Final    NO GROWTH 5 DAYS Performed at [facility], [address], [place], [place] [address]    Report Status 01/18/2024 FINAL  Final  Blood culture (routine x 2)     Status: None   Collection Time: 01/13/24  6:22 AM   Specimen: BLOOD RIGHT HAND  Result Value Ref Range Status   Specimen Description BLOOD RIGHT HAND  Final   Special Requests   Final    BOTTLES DRAWN AEROBIC ONLY Blood Culture adequate volume   Culture   Final    NO GROWTH 5 DAYS Performed at [facility], [address] [place], [place] [address]    Report Status 01/18/2024 FINAL  Final  C Difficile Quick Screen w PCR reflex     Status: Abnormal   Collection Time: 01/14/24 11:18 PM   Specimen: STOOL  Result Value Ref Range Status   C Diff antigen POSITIVE (A) NEGATIVE Final   C Diff toxin POSITIVE (A) NEGATIVE Final   C Diff interpretation Toxin producing C. difficile detected.  Final    Comment: RESULT CALLED TO, READ BACK BY AND VERIFIED WITH: D MELAND RN 01/15/2024 @ 0113 BY AB Performed at [facility], [address], [place], [place] [address]     RADIOLOGY STUDIES/RESULTS: No results found.   LOS: 5 days   Jeoffrey Massed, MD  [facility]    To contact the attending provider between 7A-7P or the covering provider during after hours 7P-7A, please log into the web site [email or web address] and access using universal [place] password for that web site. If you do not have the password, please call the hospital operator.  01/18/2024, 12:24 PM

## 2024-01-18 NOTE — Plan of Care (Signed)
  Problem: Skin Integrity: Goal: Risk for impaired skin integrity will decrease Outcome: Progressing   Problem: Education: Goal: Knowledge of General Education information will improve Description: Including pain rating scale, medication(s)/side effects and non-pharmacologic comfort measures Outcome: Progressing   Problem: Health Behavior/Discharge Planning: Goal: Ability to manage health-related needs will improve Outcome: Progressing   Problem: Clinical Measurements: Goal: Will remain free from infection Outcome: Progressing Goal: Diagnostic test results will improve Outcome: Progressing Goal: Respiratory complications will improve Outcome: Progressing   Problem: Activity: Goal: Risk for activity intolerance will decrease Outcome: Progressing

## 2024-01-18 NOTE — Procedures (Signed)
 Seen and examined on dialysis.  Procedure supervised.  Blood pressure 107/60 and HR 68.  Tolerating goal.  Left AVF in use.   Estanislado Emms, MD 01/18/2024  9:14 AM

## 2024-01-18 NOTE — Progress Notes (Addendum)
 [facility] Progress Note   Subjective: Seen in HD. Says her BP dropped a little last night. Adjust midodrine again. Pleasant, no C/Os.   Objective Vitals:   01/18/24 0413 01/18/24 0814 01/18/24 0837 01/18/24 0900  BP: 104/60 119/66 113/64 112/66  Pulse: 68 73 72 70  Resp: 15 (!) 23 (!) 21 17  Temp: 98.2 F (36.8 C) 98.1 F (36.7 C)    TempSrc: Oral     SpO2: 100% 98% 97% 99%  Weight:      Height:       Physical Exam General: Chronically ill appearing female in NAD Heart: S1,S2 No M/R/G Lungs: CTAB. No WOB Abdomen: NABS, NT Extremities: Woody appearance BLE + edema  Dialysis Access: L AVF + T/B  Additional Objective Labs: Basic Metabolic Panel: Recent Labs  Lab 01/14/24 0547 01/15/24 0459 01/16/24 1256 01/18/24 0523  NA 132* 133* 131* 130*  K 4.1 4.8 5.0 4.9  CL 94* 98 98 94*  CO2 21* 21* 21* 22  GLUCOSE 243* 199* 144* 171*  BUN 35* 50* 63* 59*  CREATININE 6.59* 7.83* 8.85* 7.03*  CALCIUM 8.3* 8.4* 8.7* 9.1  PHOS 5.9*  --  8.6* 7.4*   Liver Function Tests: Recent Labs  Lab 01/13/24 0152 01/16/24 1256 01/18/24 0523  AST 11*  --   --   ALT 11  --   --   ALKPHOS 116  --   --   BILITOT 0.8  --   --   PROT 6.9  --   --   ALBUMIN 3.6 3.2* 3.1*   No results for input(s): "LIPASE", "AMYLASE" in the last 168 hours. CBC: Recent Labs  Lab 01/13/24 0152 01/14/24 0547 01/15/24 0459 01/16/24 1256 01/18/24 0836  WBC 8.5 7.7 7.1 7.5 8.2  HGB 10.5* 9.6* 9.9* 10.4* 10.3*  HCT 33.4* 30.5* 31.3* 33.0* 32.8*  MCV 92.8 90.5 91.8 90.4 91.6  PLT 151 142* 142* 151 141*   Blood Culture    Component Value Date/Time   SDES BLOOD RIGHT HAND 01/13/2024 0622   SPECREQUEST  01/13/2024 0622    BOTTLES DRAWN AEROBIC ONLY Blood Culture adequate volume   CULT  01/13/2024 0622    NO GROWTH 5 DAYS Performed at [facility], [address] [place], [place] [address]    REPTSTATUS 01/18/2024 FINAL 01/13/2024 1914    Cardiac Enzymes: No results  for input(s): "CKTOTAL", "CKMB", "CKMBINDEX", "TROPONINI" in the last 168 hours. CBG: Recent Labs  Lab 01/16/24 2219 01/17/24 0916 01/17/24 1131 01/17/24 1537 01/17/24 2128  GLUCAP 241* 168* 176* 150* 246*   Iron Studies: No results for input(s): "IRON", "TIBC", "TRANSFERRIN", "FERRITIN" in the last 72 hours. @lablastinr3 @ Studies/Results: No results found. Medications:   atorvastatin  10 mg Oral QPM   Chlorhexidine Gluconate Cloth  6 each Topical Q0600   Chlorhexidine Gluconate Cloth  6 each Topical Q0600   ferric citrate  630 mg Oral TID WC   heparin  5,000 Units Subcutaneous Q8H   hydrocerin   Topical BID   insulin aspart  0-5 Units Subcutaneous QHS   insulin aspart  0-9 Units Subcutaneous TID WC   insulin glargine  5 Units Subcutaneous Daily   leptospermum manuka honey  1 Application Topical Daily   levothyroxine  75 mcg Oral Daily   midodrine  10 mg Oral Daily   midodrine  10 mg Oral Q T,Th,Sa-HD   sodium chloride flush  3 mL Intravenous Q12H   vancomycin  125 mg Oral TID  AC & HS   Followed by   Melene Muller ON 01/29/2024] vancomycin  125 mg Oral BID   Followed by   Melene Muller ON 02/05/2024] vancomycin  125 mg Oral Daily   Followed by   Melene Muller ON 02/12/2024] vancomycin  125 mg Oral QODAY   Followed by   Melene Muller ON 03/11/2024] vancomycin  125 mg Oral Q3 days     Dialysis Orders: [facility] From [place] [facility] -->  3h  91.5kg  2K bath  B400  LUA AVF  Heparin none    Renal-related home meds: - midodrine 10mg   - auryxia 3 ac tid - others: PPI, insulins, vit D, T4, statin      Assessment/Plan: Infected diabetic foot ulcer - started on IV vanc and cefepime. Per pmd. Patient was seen by Dr. Lajoyce Corners and Vascular in the past. S/p lower extremity angiography which apparently showed significant occluded RCA, but otherwise no significant stenosis noted. Seen by Dr. Lajoyce Corners today. No surgical intervention planned.  C diff diarrhea-per primary ESRD - on HD TTS. Next HD  01/20/2024. Chronic hypotension -Change  midodrine 10 mg PO TID. Continue additional midodrine doses on HD days.  Volume - Chronic woody edema BLE but volume status seems acceptable. Now under OP EDW. Will need EDW adjusted on discharge.  UF as tolerated.  Anemia of esrd - Hb 10s, follow. Get records.  Secondary hyperparathyroidism - CCa in range, add on phos. Cont binders w/ meals DM2 on insulin  Rita H. Brown NP-C 01/18/2024, 9:30 AM  [employer] [phone]   Seen and examined independently.  Agree with note and exam as documented above by physician extender and as noted here.  See my procedure note from today.  Per team patient may discharge tomorrow  Estanislado Emms, MD 01/18/2024  2:22 PM

## 2024-01-18 NOTE — Procedures (Signed)
 Received patient in bed to unit.  Alert and oriented.  Informed consent signed and in chart.   TX duration:3 hours 45 min  Patient tolerated well.  Transported back to the room  Alert, without acute distress.  Hand-off given to patient's nurse.   Access used: left avf Access issues: none  Total UF removed: 2 liters Medication(s) given: midodrine   Lu Duffel, RN [facility]

## 2024-01-19 ENCOUNTER — Other Ambulatory Visit (HOSPITAL_COMMUNITY): Payer: Self-pay

## 2024-01-19 DIAGNOSIS — N186 End stage renal disease: Secondary | ICD-10-CM | POA: Diagnosis not present

## 2024-01-19 DIAGNOSIS — L97411 Non-pressure chronic ulcer of right heel and midfoot limited to breakdown of skin: Secondary | ICD-10-CM | POA: Diagnosis not present

## 2024-01-19 DIAGNOSIS — E1122 Type 2 diabetes mellitus with diabetic chronic kidney disease: Secondary | ICD-10-CM | POA: Diagnosis not present

## 2024-01-19 DIAGNOSIS — L97519 Non-pressure chronic ulcer of other part of right foot with unspecified severity: Secondary | ICD-10-CM | POA: Diagnosis not present

## 2024-01-19 LAB — GLUCOSE, CAPILLARY
Glucose-Capillary: 172 mg/dL — ABNORMAL HIGH (ref 70–99)
Glucose-Capillary: 165 mg/dL — ABNORMAL HIGH (ref 70–99)

## 2024-01-19 MED ORDER — MIDODRINE HCL 10 MG PO TABS
10.0000 mg | ORAL_TABLET | Freq: Three times a day (TID) | ORAL | 0 refills | Status: DC
  Filled 2024-01-19: qty 90, 30d supply, fill #0

## 2024-01-19 MED ORDER — INSULIN GLARGINE 100 UNIT/ML ~~LOC~~ SOLN: 8.0000 [IU] | Freq: Every day | SUBCUTANEOUS | Status: DC

## 2024-01-19 MED ORDER — VANCOMYCIN HCL 125 MG PO CAPS
ORAL_CAPSULE | ORAL | 0 refills | Status: DC
  Filled 2024-01-19 (×2): qty 32, 8d supply, fill #0
  Filled 2024-01-29: qty 68, 42d supply, fill #0
  Filled 2024-01-29: qty 32, 8d supply, fill #0

## 2024-01-19 NOTE — Consult Note (Signed)
 [facility] Liaison Consult Note   01/19/2024  Amy Moses 1971/04/20 [id number]  Insurance: Medicare ACO REACH  Primary Care Provider: Lorelei Pont, DO [facility], this provider is not a VBCI provider in network.  However, patient sees Chilton Si, MD, associated with [facility]     [facility] Liaison rounding and spoke with patient via phone regarding SDOH needs noted on high risk screening.  Patient states she receives transportaiton via her Medicaid and she receives only $23 in [employer].  She is interested in other food resources in her area.   The patient was screened for  day readmission hospitalization with noted high risk score for unplanned readmission risk 3 hospital admissions in 6 months at [facility] facility..  The patient was assessed for potential [name] Coordination service needs for post hospital transition for care coordination. Review of patient's electronic medical record reveals patient receives HD in [place] at [place] .   Plan: [facility] Liaison will continue to follow progress and disposition to asess for post hospital community care coordination/management needs.  Referral request for community care coordination: Will make a referral for VBCI team for follow up.   VBCI Community Care, Population Health does not replace or interfere with any arrangements made by the Inpatient Transition of Care team.   For questions contact:   Charlesetta Shanks, RN, BSN, CCM   [name], [facility] Liaison Direct Dial: [phone] or secure chat Email: [email or web address]

## 2024-01-19 NOTE — Progress Notes (Addendum)
 [facility] Progress Note   Subjective: Going home today. Discussed midodrine dosing and application of emollient cream to BLE. No C/O. Expresses gratitude for care received.   Objective Vitals:   01/18/24 2300 01/19/24 0000 01/19/24 0539 01/19/24 0830  BP:  99/63 97/60 101/65  Pulse: 73  65 68  Resp: 16  10 18   Temp:  (!) 97.4 F (36.3 C) 97.6 F (36.4 C) (!) 97.2 F (36.2 C)  TempSrc:  Oral Oral Oral  SpO2: 93%  99% 96%  Weight:      Height:       Physical Exam General: Chronically ill appearing female in NAD Heart: S1,S2 No M/R/G Lungs: CTAB. No WOB Abdomen: NABS, NT Extremities: Woody appearance BLE + edema  Dialysis Access: L AVF + T/B  Additional Objective Labs: Basic Metabolic Panel: Recent Labs  Lab 01/14/24 0547 01/15/24 0459 01/16/24 1256 01/18/24 0523  NA 132* 133* 131* 130*  K 4.1 4.8 5.0 4.9  CL 94* 98 98 94*  CO2 21* 21* 21* 22  GLUCOSE 243* 199* 144* 171*  BUN 35* 50* 63* 59*  CREATININE 6.59* 7.83* 8.85* 7.03*  CALCIUM 8.3* 8.4* 8.7* 9.1  PHOS 5.9*  --  8.6* 7.4*   Liver Function Tests: Recent Labs  Lab 01/13/24 0152 01/16/24 1256 01/18/24 0523  AST 11*  --   --   ALT 11  --   --   ALKPHOS 116  --   --   BILITOT 0.8  --   --   PROT 6.9  --   --   ALBUMIN 3.6 3.2* 3.1*   No results for input(s): "LIPASE", "AMYLASE" in the last 168 hours. CBC: Recent Labs  Lab 01/13/24 0152 01/14/24 0547 01/15/24 0459 01/16/24 1256 01/18/24 0836  WBC 8.5 7.7 7.1 7.5 8.2  HGB 10.5* 9.6* 9.9* 10.4* 10.3*  HCT 33.4* 30.5* 31.3* 33.0* 32.8*  MCV 92.8 90.5 91.8 90.4 91.6  PLT 151 142* 142* 151 141*   Blood Culture    Component Value Date/Time   SDES BLOOD RIGHT HAND 01/13/2024 0622   SPECREQUEST  01/13/2024 0622    BOTTLES DRAWN AEROBIC ONLY Blood Culture adequate volume   CULT  01/13/2024 0622    NO GROWTH 5 DAYS Performed at [facility], [address], [place], [place] [address]    REPTSTATUS 01/18/2024 FINAL  01/13/2024 1914    Cardiac Enzymes: No results for input(s): "CKTOTAL", "CKMB", "CKMBINDEX", "TROPONINI" in the last 168 hours. CBG: Recent Labs  Lab 01/17/24 1537 01/17/24 2128 01/18/24 1649 01/18/24 2134 01/19/24 0827  GLUCAP 150* 246* 174* 229* 172*   Iron Studies: No results for input(s): "IRON", "TIBC", "TRANSFERRIN", "FERRITIN" in the last 72 hours. @lablastinr3 @ Studies/Results: No results found. Medications:   atorvastatin  10 mg Oral QPM   Chlorhexidine Gluconate Cloth  6 each Topical Q0600   Chlorhexidine Gluconate Cloth  6 each Topical Q0600   ferric citrate  630 mg Oral TID WC   heparin  5,000 Units Subcutaneous Q8H   hydrocerin   Topical BID   insulin aspart  0-5 Units Subcutaneous QHS   insulin aspart  0-9 Units Subcutaneous TID WC   insulin glargine  5 Units Subcutaneous Daily   leptospermum manuka honey  1 Application Topical Daily   levothyroxine  75 mcg Oral Daily   midodrine  10 mg Oral Daily   midodrine  10 mg Oral Q T,Th,Sa-HD   sodium chloride flush  3 mL Intravenous Q12H  vancomycin  125 mg Oral TID AC & HS   Followed by   Melene Muller ON 01/29/2024] vancomycin  125 mg Oral BID   Followed by   Melene Muller ON 02/05/2024] vancomycin  125 mg Oral Daily   Followed by   Melene Muller ON 02/12/2024] vancomycin  125 mg Oral QODAY   Followed by   Melene Muller ON 03/11/2024] vancomycin  125 mg Oral Q3 days     Dialysis Orders: [place] [place] From [place] 2024 -->  3h  91.5kg  2K bath  B400  LUA AVF  Heparin none    Renal-related home meds: - midodrine 10mg   - auryxia 3 ac tid - others: PPI, insulins, vit D, T4, statin      Assessment/Plan: Infected diabetic foot ulcer - started on IV vanc and cefepime. Per pmd. Patient was seen by Dr. Lajoyce Corners and Vascular in the past. S/p lower extremity angiography which apparently showed significant occluded RCA, but otherwise no significant stenosis noted. Seen by Dr. Lajoyce Corners today. No surgical intervention planned.  C diff  diarrhea-per primary ESRD - on HD TTS. Next HD 01/20/2024.  [facility].  Chronic hypotension -Change  midodrine 10 mg PO TID. Continue additional midodrine doses on HD days.  Volume - Chronic woody edema BLE but volume status seems acceptable. Now under OP EDW. Will need EDW adjusted on discharge.  UF as tolerated.  Anemia of esrd - Hb 10s, follow. Get records.  Secondary hyperparathyroidism - CCa in range, add on phos. Cont binders w/ meals DM2 on insulin  Disposition: Home today.     Rita H. Brown NP-C 01/18/2024, 9:30 AM  [employer] [phone]  Dene Gentry. Brown NP-C 01/19/2024, 10:44 AM  [employer] [phone]   Seen and examined independently.  Agree with note and exam as documented above by physician extender and as noted here.  Feels better. Diarrhea much improved.  She is to be discharged today.  Confirms she dialyzes at [facility].  She feels like her legs are doing better  General adult female in bed in no acute distress HEENT normocephalic atraumatic extraocular movements intact sclera anicteric Neck supple trachea midline Lungs clear to auscultation bilaterally normal work of breathing at rest  Heart S1S2 no rub Abdomen soft nontender nondistended Extremities woody edema, legs wrapped Psych normal mood and affect Access LUE AVF with bruit and thrill    ESRD on HD - HD per TTS schedule   C dif - on po vanc per primary. Note other abx discontinued  Chronic hypotension - on midodrine   Anemia of CKD- Hb acceptable   Estanislado Emms, MD 01/19/2024  12:50 PM

## 2024-01-19 NOTE — Discharge Summary (Addendum)
 PATIENT DETAILS Name: Amy Moses Age: 53 y.o. Sex: female Date of Birth: 11-Mar-1971 MRN: [id number]. Admitting Physician: Nolberto Hanlon, MD WUJ:[id number], Loraine Leriche, DO  Admit Date: 01/13/2024 Discharge date: 01/19/2024  Recommendations for Outpatient Follow-up:  Follow up with PCP in 1-2 weeks Please obtain CMP/CBC in one week Minimize antibiotic use is much as possible-second episode of C. difficile diarrhea.  Admitted From:  Home  Disposition: Home health   Discharge Condition: fair  CODE STATUS:   Code Status: Full Code   Diet recommendation:  Diet Order             Diet - low sodium heart healthy           Diet Carb Modified           Diet renal/carb modified with fluid restriction Diet-HS Snack? Nothing; Fluid restriction: 1200 mL Fluid; Room service appropriate? Yes; Fluid consistency: Thin  Diet effective now                    Brief Summary: Patient is a 53 y.o.  female with history of ESRD on HD TTS, chronic right heel ulcer with chronic osteomyelitis of right cuboid bone, C. difficile colitis, chronic HFpEF, hypothyroidism, DM-2 presented with possible purulent discharge from a right hip wound-and ongoing diarrhea.  She was subsequently admitted to the hospitalist service.  See below for further details.   Significant events: 2/22>> admit to [facility]   Significant studies: 2/22>> MRI right heel: Ongoing osteomyelitis of the plantar aspect of the cuboid.   Significant microbiology data: 2/23>> stool C. difficile toxin/antigen: Positive 2/22>> blood culture: No growth   Procedures: None   Consults: Nephrology Orthopedics  Brief Hospital Course: Recurrent C. difficile colitis/diarrhea Recently completed treatment of C. difficile December 2024 Thankfully much improved-diarrhea is markedly better-stool more formed-hardly any diarrhea yesterday. Continue oral vancomycin taper x 6 weeks.  Counseled regarding minimizing antibiotic use in the future.    Chronic right heel ulcer with chronic osteomyelitis of the cuboid bone No infection on exam-no longer on IV antibiotics given C. difficile colitis Dr. Lajoyce Corners recommending supportive care/wound care-does not have any acute indications for surgery-however per Dr. Governor Specking indicated in the future-she will likely require a BKA Per patient-she has a follow-up with Dr. Lajoyce Corners next week.   ESRD on HD TTS Nephrology followed closely. Resume outpatient hemodialysis schedule as previous.   Normocytic anemia Secondary to ESRD Iron/Aranesp per nephrology   DM-2 (A1c 9.0 on 08/02/2023) CBG stable but slightly on the higher side Increase Lantus to 8 units (normally takes 8 units) Follow with PCP and optimize.  Chronic HFpEF Volume status stable-being managed with hemodialysis.   HLD Statin   Chronic hypotension Midodrine-follow BP trend   Hypothyroidism Synthroid   OSA CPAP nightly   Stage III sacral decubitus ulcer: POA Wound care per WOC team.  Home health ordered.   Class 1 Obesity: Estimated body mass index is 31.35 kg/m as calculated from the following:   Height as of this encounter: 5\' 6"  (1.676 m).   Weight as of this encounter: 88.1 kg.    Discharge Diagnoses:  Principal Problem:   Ulcerated, foot (HCC) Active Problems:   ESRD (end stage renal disease) on dialysis (HCC)   Shoulder pain   Hypotension   Skin ulcer of right heel, limited to breakdown of skin [facility])   Discharge Instructions:  Activity:  As tolerated with Full fall precautions use walker/cane & assistance as needed  Discharge Instructions  Diet - low sodium heart healthy   Complete by: As directed    Diet Carb Modified   Complete by: As directed    Discharge instructions   Complete by: As directed    Follow with Primary MD  Lorelei Pont, DO in 1-2 weeks  Minimize use of antibiotics in the future is much as possible.  Follow-up with the dialysis center as previously scheduled  Please get a  complete blood count and chemistry panel checked by your Primary MD at your next visit, and again as instructed by your Primary MD.  Get Medicines reviewed and adjusted: Please take all your medications with you for your next visit with your Primary MD  Laboratory/radiological data: Please request your Primary MD to go over all hospital tests and procedure/radiological results at the follow up, please ask your Primary MD to get all Hospital records sent to his/her office.  In some cases, they will be blood work, cultures and biopsy results pending at the time of your discharge. Please request that your primary care M.D. follows up on these results.  Also Note the following: If you experience worsening of your admission symptoms, develop shortness of breath, life threatening emergency, suicidal or homicidal thoughts you must seek medical attention immediately by calling 911 or calling your MD immediately  if symptoms less severe.  You must read complete instructions/literature along with all the possible adverse reactions/side effects for all the Medicines you take and that have been prescribed to you. Take any new Medicines after you have completely understood and accpet all the possible adverse reactions/side effects.   Do not drive when taking Pain medications or sleeping medications (Benzodaizepines)  Do not take more than prescribed Pain, Sleep and Anxiety Medications. It is not advisable to combine anxiety,sleep and pain medications without talking with your primary care practitioner  Special Instructions: If you have smoked or chewed Tobacco  in the last 2 yrs please stop smoking, stop any regular Alcohol  and or any Recreational drug use.  Wear Seat belts while driving.  Please note: You were cared for by a hospitalist during your hospital stay. Once you are discharged, your primary care physician will handle any further medical issues. Please note that NO REFILLS for any discharge  medications will be authorized once you are discharged, as it is imperative that you return to your primary care physician (or establish a relationship with a primary care physician if you do not have one) for your post hospital discharge needs so that they can reassess your need for medications and monitor your lab values.   Discharge wound care:   Complete by: As directed    Left buttock: Apply Medihoney to the wound bed, change daily. Cover with foam dressing, is ok to lift up the foam and reaply the Medihoney, change every 3 days or PRN soiling. Cover with foam dressing both sides to prevent further injuries.   Increase activity slowly   Complete by: As directed       Allergies as of 01/19/2024       Reactions   Ace Inhibitors Cough        Medication List     TAKE these medications    Accu-Chek Guide Me w/Device Kit 1 Piece by Does not apply route as directed.   Accu-Chek Guide test strip Generic drug: glucose blood Use as instructed   atorvastatin 10 MG tablet Commonly known as: LIPITOR Take 10 mg by mouth every evening.   Auryxia 1 GM  210 MG(Fe) tablet Generic drug: ferric citrate Take 630 mg by mouth 3 (three) times daily with meals.   Blood Pressure Monitor Kit TAKE BLOOD PRESSURE DAILY   diphenoxylate-atropine 2.5-0.025 MG tablet Commonly known as: LOMOTIL Take 2 tablets by mouth 4 (four) times daily as needed for diarrhea or loose stools.   FreeStyle Libre 2 Reader [name] As directed   [name] 2 Sensor Misc 1 Piece by Does not apply route every 14 (fourteen) days.   HumaLOG KwikPen 100 UNIT/ML KwikPen Generic drug: insulin lispro Inject 5-11 Units into the skin 3 (three) times daily with meals. If eats 50% or more of meal. What changed: additional instructions   insulin glargine 100 UNIT/ML injection Commonly known as: LANTUS Inject 0.08 mLs (8 Units total) into the skin at bedtime.   levothyroxine 75 MCG tablet Commonly known as:  SYNTHROID Take 75 mcg by mouth daily.   midodrine 10 MG tablet Commonly known as: PROAMATINE Take 1 tablet (10 mg total) by mouth 3 (three) times daily. What changed: when to take this   ondansetron 4 MG disintegrating tablet Commonly known as: ZOFRAN-ODT Take 1 tablet (4 mg total) by mouth daily as needed for nausea or vomiting. Only use for severe nausea/vomiting while on Dificid.   pantoprazole 40 MG tablet Commonly known as: Protonix Take 1 tablet (40 mg total) by mouth 2 (two) times daily.   vancomycin 125 MG capsule Commonly known as: Vancocin Vancomycin 125 mg p.o. 3 times daily and at bedtime-until 3/10, then vancomycin 125 mg 3 times daily for 7 days, then vancomycin 125 mg p.o. daily for 7 days, then vancomycin 125 mg p.o. every other day for 28 days, then vancomycin 125 mg p.o. every 3 days for 28 days.   Vitamin D (Ergocalciferol) 1.25 MG (50000 UNIT) Caps capsule Commonly known as: DRISDOL Take 50,000 Units by mouth every 7 (seven) days. Friday               Discharge Care Instructions  (From admission, onward)           Start     Ordered   01/19/24 0000  Discharge wound care:       Comments: Left buttock: Apply Medihoney to the wound bed, change daily. Cover with foam dressing, is ok to lift up the foam and reaply the Medihoney, change every 3 days or PRN soiling. Cover with foam dressing both sides to prevent further injuries.   01/19/24 6045            Follow-up Information     Lorelei Pont, DO. Schedule an appointment as soon as possible for a visit in 1 week(s).   Specialty: Family Medicine Contact information: [address] [place] [address] [phone]         Care, [facility] Follow up.   Why: [facility] will contact you to arrange a home visit for  physical therapy Contact information: [address] [place] [address] [phone]                Allergies  Allergen Reactions   Ace Inhibitors  Cough     Other Procedures/Studies: MR HEEL RIGHT WO CONTRAST Result Date: 01/13/2024 CLINICAL DATA:  Foot swelling, diabetic, osteomyelitis suspected, xray done EXAM: MR OF THE RIGHT HEEL WITHOUT CONTRAST TECHNIQUE: Multiplanar, multisequence MR imaging of the right heel was performed. No intravenous contrast was administered. COMPARISON:  MRI 08/04/2023 FINDINGS: Bones/Joint/Cartilage Findings of ongoing acute osteomyelitis involving the plantar aspect of the  cuboid where there is intense focal bone marrow edema and confluent low T1 marrow signal changes (series 7, image 8). New subcortical bone marrow edema along the lateral aspect of the posterior calcaneus (series 7, image 9). No associated T1 marrow signal alteration at this time. No acute fracture or dislocation is identified. Extensive metallic susceptibility artifact related to patient's ORIF hardware within the distal tibia and fibula. Ligaments Suboptimally assessed given the degree of adjacent artifact. No new ligamentous injury is evident. Muscles and Tendons Generalized muscle atrophy which may be secondary to denervation. No tenosynovitis. Soft tissues Soft tissue wound or ulceration at the plantar aspect of the midfoot underlying the cuboid. Soft tissue edema is present along the plantar aspect of the posterior calcaneus. No drainable fluid collection. IMPRESSION: 1. Findings of ongoing acute osteomyelitis involving the plantar aspect of the cuboid. 2. New subcortical bone marrow edema along the lateral aspect of the posterior calcaneus. This may be reactive, posttraumatic, or reflect early changes of acute osteomyelitis. 3. Soft tissue wound or ulceration at the plantar aspect of the midfoot underlying the cuboid. No drainable fluid collection. Electronically Signed   By: Duanne Guess D.O.   On: 01/13/2024 12:41   DG Shoulder Right Result Date: 01/13/2024 CLINICAL DATA:  [id number] Pain [id number] EXAM: RIGHT SHOULDER - 2+ VIEW COMPARISON:   Chest x-ray 06/24/2023 FINDINGS: There is no evidence of fracture or dislocation. Question Hill-Sachs deformity. Mild degenerative changes of the shoulder. Soft tissues are unremarkable. Vascular calcifications. IMPRESSION: No acute displaced fracture or dislocation. Electronically Signed   By: Tish Frederickson M.D.   On: 01/13/2024 11:21   DG Foot Complete Right Result Date: 01/13/2024 CLINICAL DATA:  Right heel wound. EXAM: RIGHT FOOT COMPLETE - 3+ VIEW COMPARISON:  Right foot series 08/02/2023 FINDINGS: Three views. There is a small ulceration laterally underlying the cuboid bone, small amount of overlying dystrophic calcification. There is moderate generalized edema greatest in the forefoot. The exact location of the heel ulcer is not clear from these images. There are extensive vascular calcifications. Distal tibial and fibular plate fixation hardware is again noted without loosening. There is severe hallux valgus and first MTP DJD. Fairly profound osteopenia. There are healed fracture deformities of the distal second and third metatarsal shafts, old amputation of the fifth metatarsal and toe. Relatively mild midfoot arthrosis is again noted. No erosive arthropathy is seen. No aggressive periosteal reaction suspicious for acute osteomyelitis. There is a moderate-sized plantar calcaneal spur which appears noninflammatory. Overall, no noteworthy interval change seen compared to prior exam. The swelling in the forefoot has slightly improved, but only very minimally. IMPRESSION: 1. Small ulceration laterally underlying the cuboid bone, with small amount of overlying dystrophic calcification. This was seen previously. 2. No aggressive periosteal reaction suspicious for acute osteomyelitis. 3. Moderate generalized edema greatest in the forefoot. 4. Osteopenia, degenerative and postsurgical changes. 5. Extensive vascular calcifications. Electronically Signed   By: Almira Bar M.D.   On: 01/13/2024 06:45      TODAY-DAY OF DISCHARGE:  Subjective:   [name] today has no headache,no chest abdominal pain,no new weakness tingling or numbness, feels much better wants to go home today.   Objective:   Blood pressure 101/65, pulse 68, temperature (!) 97.2 F (36.2 C), temperature source Oral, resp. rate 18, height 5\' 6"  (1.676 m), weight 88.1 kg, last menstrual period 08/22/2015, SpO2 96%.  Intake/Output Summary (Last 24 hours) at 01/19/2024 1015 Last data filed at 01/18/2024 1241 Gross per 24 hour  Intake --  Output 2000 ml  Net -2000 ml   Filed Weights   01/13/24 0144 01/16/24 0515 01/16/24 1450  Weight: 91.4 kg 75.3 kg 88.1 kg    Exam: Awake Alert, Oriented *3, No new F.N deficits, Normal affect [place].AT,PERRAL Supple Neck,No JVD, No cervical lymphadenopathy appriciated.  Symmetrical Chest wall movement, Good air movement bilaterally, CTAB RRR,No Gallops,Rubs or new Murmurs, No Parasternal Heave +ve B.Sounds, Abd Soft, Non tender, No organomegaly appriciated, No rebound -guarding or rigidity. No Cyanosis, Clubbing or edema, No new Rash or bruise   PERTINENT RADIOLOGIC STUDIES: No results found.   PERTINENT LAB RESULTS: CBC: Recent Labs    01/16/24 1256 01/18/24 0836  WBC 7.5 8.2  HGB 10.4* 10.3*  HCT 33.0* 32.8*  PLT 151 141*   CMET CMP     Component Value Date/Time   NA 130 (L) 01/18/2024 0523   K 4.9 01/18/2024 0523   CL 94 (L) 01/18/2024 0523   CO2 22 01/18/2024 0523   GLUCOSE 171 (H) 01/18/2024 0523   BUN 59 (H) 01/18/2024 0523   CREATININE 7.03 (H) 01/18/2024 0523   CALCIUM 9.1 01/18/2024 0523   PROT 6.9 01/13/2024 0152   ALBUMIN 3.1 (L) 01/18/2024 0523   AST 11 (L) 01/13/2024 0152   ALT 11 01/13/2024 0152   ALKPHOS 116 01/13/2024 0152   BILITOT 0.8 01/13/2024 0152   GFRNONAA 7 (L) 01/18/2024 0523    GFR Estimated Creatinine Clearance: 10.7 mL/min (A) (by C-G formula based on SCr of 7.03 mg/dL (H)). No results for input(s): "LIPASE",  "AMYLASE" in the last 72 hours. No results for input(s): "CKTOTAL", "CKMB", "CKMBINDEX", "TROPONINI" in the last 72 hours. Invalid input(s): "POCBNP" No results for input(s): "DDIMER" in the last 72 hours. No results for input(s): "HGBA1C" in the last 72 hours. No results for input(s): "CHOL", "HDL", "LDLCALC", "TRIG", "CHOLHDL", "LDLDIRECT" in the last 72 hours. No results for input(s): "TSH", "T4TOTAL", "T3FREE", "THYROIDAB" in the last 72 hours.  Invalid input(s): "FREET3" No results for input(s): "VITAMINB12", "FOLATE", "FERRITIN", "TIBC", "IRON", "RETICCTPCT" in the last 72 hours. Coags: No results for input(s): "INR" in the last 72 hours.  Invalid input(s): "PT" Microbiology: Recent Results (from the past 240 hours)  Blood culture (routine x 2)     Status: None   Collection Time: 01/13/24  5:50 AM   Specimen: BLOOD RIGHT FOREARM  Result Value Ref Range Status   Specimen Description BLOOD RIGHT FOREARM  Final   Special Requests   Final    BOTTLES DRAWN AEROBIC AND ANAEROBIC Blood Culture results may not be optimal due to an inadequate volume of blood received in culture bottles   Culture   Final    NO GROWTH 5 DAYS Performed at [facility], [address], [place], [place] [address]    Report Status 01/18/2024 FINAL  Final  Blood culture (routine x 2)     Status: None   Collection Time: 01/13/24  6:22 AM   Specimen: BLOOD RIGHT HAND  Result Value Ref Range Status   Specimen Description BLOOD RIGHT HAND  Final   Special Requests   Final    BOTTLES DRAWN AEROBIC ONLY Blood Culture adequate volume   Culture   Final    NO GROWTH 5 DAYS Performed at [facility], [address], [place], [place] [address]    Report Status 01/18/2024 FINAL  Final  C Difficile Quick Screen w PCR reflex     Status: Abnormal   Collection Time: 01/14/24 11:18  PM   Specimen: STOOL  Result Value Ref Range Status   C Diff antigen POSITIVE (A) NEGATIVE Final   C Diff toxin POSITIVE  (A) NEGATIVE Final   C Diff interpretation Toxin producing C. difficile detected.  Final    Comment: RESULT CALLED TO, READ BACK BY AND VERIFIED WITH: D MELAND RN 01/15/2024 @ 0113 BY AB Performed at [facility], [address], [place], [place] [address]     FURTHER DISCHARGE INSTRUCTIONS:  Get Medicines reviewed and adjusted: Please take all your medications with you for your next visit with your Primary MD  Laboratory/radiological data: Please request your Primary MD to go over all hospital tests and procedure/radiological results at the follow up, please ask your Primary MD to get all Hospital records sent to his/her office.  In some cases, they will be blood work, cultures and biopsy results pending at the time of your discharge. Please request that your primary care M.D. goes through all the records of your hospital data and follows up on these results.  Also Note the following: If you experience worsening of your admission symptoms, develop shortness of breath, life threatening emergency, suicidal or homicidal thoughts you must seek medical attention immediately by calling 911 or calling your MD immediately  if symptoms less severe.  You must read complete instructions/literature along with all the possible adverse reactions/side effects for all the Medicines you take and that have been prescribed to you. Take any new Medicines after you have completely understood and accpet all the possible adverse reactions/side effects.   Do not drive when taking Pain medications or sleeping medications (Benzodaizepines)  Do not take more than prescribed Pain, Sleep and Anxiety Medications. It is not advisable to combine anxiety,sleep and pain medications without talking with your primary care practitioner  Special Instructions: If you have smoked or chewed Tobacco  in the last 2 yrs please stop smoking, stop any regular Alcohol  and or any Recreational drug use.  Wear Seat belts while  driving.  Please note: You were cared for by a hospitalist during your hospital stay. Once you are discharged, your primary care physician will handle any further medical issues. Please note that NO REFILLS for any discharge medications will be authorized once you are discharged, as it is imperative that you return to your primary care physician (or establish a relationship with a primary care physician if you do not have one) for your post hospital discharge needs so that they can reassess your need for medications and monitor your lab values.  Total Time spent coordinating discharge including counseling, education and face to face time equals greater than 30 minutes.  Signed: Galileo Colello 01/19/2024 10:15 AM

## 2024-01-19 NOTE — Plan of Care (Signed)
 Discharging home with her daughter this afternoon, when transportation is available.

## 2024-01-19 NOTE — TOC Transition Note (Signed)
 Transition of Care [facility]) - Discharge Note   Patient Details  Name: Amy Moses MRN: [id number] Date of Birth: May 29, 1971  [facility] CM/SW Contact:  Gordy Clement, RN Phone Number: 01/19/2024, 9:16 AM   Clinical Narrative:    Patient will DC to home today  [facility] PT has been ordered and will be provided by [facility]. No DME is needed. Patients Daughter will transport home later this afternoon- She works 3rd shift and is home sleeping at the moment. Patient's boyfriend will assist at home  after discharge          Patient Goals and CMS Choice            Discharge Placement                       Discharge Plan and Services Additional resources added to the After Visit Summary for                                       Social Drivers of Health (SDOH) Interventions SDOH Screenings   Food Insecurity: Food Insecurity Present (01/16/2024)  Housing: Low Risk  (01/16/2024)  Transportation Needs: Unmet Transportation Needs (01/16/2024)  Utilities: Not At Risk (01/16/2024)  Depression (PHQ2-9): Low Risk  (04/06/2022)  Tobacco Use: Low Risk  (01/13/2024)     Readmission Risk Interventions    11/03/2023   11:18 AM 11/02/2023   11:31 AM 08/07/2023   12:51 PM  Readmission Risk Prevention Plan  Transportation Screening Complete Complete Complete  PCP or Specialist Appt within 5-7 Days Complete    Home Care Screening Complete    Medication Review (RN CM) Complete    HRI or Home Care Consult  Complete Complete  Social Work Consult for Recovery Care Planning/Counseling  Complete Complete  Palliative Care Screening  Not Applicable Not Applicable  Medication Review Oceanographer)  Complete Complete

## 2024-01-19 NOTE — Plan of Care (Signed)

## 2024-01-19 NOTE — Progress Notes (Signed)
 Physical Therapy Treatment Patient Details Name: Amy Moses MRN: [id number] DOB: [birth year] Today's Date: 01/19/2024   History of Present Illness Amy Moses is a 53 y.o. female admitted 2/22 with right heel ulcer.  Pt w/u for right BKA in future if medical management doesnt work.  Cdiff. WUJ:WJXBJYN wounds on her lower extremity right heel, ESRD, HD T,TH,Sat; sacral ulcer    PT Comments  Pt seen for PT tx with pt agreeable. Pt is able to complete supine>sit with mod I to come to sitting on L side of bed. Pt demonstrates good sitting balance as she engages in grooming (washing face), BLE exercises, & packing up CPAP without LOB. Pt is anticipating d/c home today.     If plan is discharge home, recommend the following: A little help with walking and/or transfers;A little help with bathing/dressing/bathroom;Assistance with cooking/housework;Assist for transportation;Help with stairs or ramp for entrance   Can travel by private vehicle        Equipment Recommendations  None recommended by PT    Recommendations for Other Services       Precautions / Restrictions Precautions Precautions: Fall Precaution/Restrictions Comments: Cdiff Restrictions Weight Bearing Restrictions Per Provider Order: No Other Position/Activity Restrictions: but pt reports MD told her to "keep the prevalon boot on and keep weight off of R heel" due to wound     Mobility  Bed Mobility   Bed Mobility: Supine to Sit     Supine to sit: Modified independent (Device/Increase time), HOB elevated, Used rails (exit L side of bed)          Transfers                        Ambulation/Gait                   Stairs             Wheelchair Mobility     Tilt Bed    Modified Rankin (Stroke Patients Only)       Balance Overall balance assessment: Needs assistance Sitting-balance support: Feet supported, No upper extremity supported Sitting balance-Leahy Scale: Good                                       Communication Communication Communication: No apparent difficulties  Cognition Arousal: Alert Behavior During Therapy: WFL for tasks assessed/performed   PT - Cognitive impairments: No apparent impairments                         Following commands: Intact      Cueing    Exercises General Exercises - Lower Extremity Long Arc Quad: AROM, Seated, Strengthening, Both, 10 reps    General Comments        Pertinent Vitals/Pain Pain Assessment Pain Assessment: No/denies pain    Home Living                          Prior Function            PT Goals (current goals can now be found in the care plan section) Acute Rehab PT Goals Patient Stated Goal: to go home PT Goal Formulation: With patient Time For Goal Achievement: 01/29/24 Potential to Achieve Goals: Good Progress towards PT goals: Progressing toward goals    Frequency  PT Plan      Co-evaluation              AM-PAC PT "6 Clicks" Mobility   Outcome Measure  Help needed turning from your back to your side while in a flat bed without using bedrails?: None Help needed moving from lying on your back to sitting on the side of a flat bed without using bedrails?: A Little Help needed moving to and from a bed to a chair (including a wheelchair)?: A Little Help needed standing up from a chair using your arms (e.g., wheelchair or bedside chair)?: Total Help needed to walk in hospital room?: Total Help needed climbing 3-5 steps with a railing? : Total 6 Click Score: 13    End of Session   Activity Tolerance: Patient tolerated treatment well Patient left: in bed;with call bell/phone within reach (sitting EOB) Nurse Communication: Mobility status PT Visit Diagnosis: Muscle weakness (generalized) (M62.81)     Time: 4098-1191 PT Time Calculation (min) (ACUTE ONLY): 14 min  Charges:    $Therapeutic Activity: 8-22 mins PT  General Charges $$ ACUTE PT VISIT: 1 Visit                     Aleda Grana, PT, DPT 01/19/24, 2:59 PM   Sandi Mariscal 01/19/2024, 2:58 PM

## 2024-01-19 NOTE — Progress Notes (Signed)
 OT Cancellation Note  Patient Details Name: Amy Moses MRN: [id number] DOB: 06-15-1971   Cancelled Treatment:    Reason Eval/Treat Not Completed: Patient declined, no reason specified Pt requested therapy check back later for OOB transfers if possible.   Lorre Munroe 01/19/2024, 8:05 AM

## 2024-01-19 NOTE — Progress Notes (Signed)
 D/C order noted. Contacted [name] to be advised of pt's d/c today and that pt should resume care tomorrow. Clinic also advised of pt's cdiff diagnosis. D/C summary and today's renal noted faxed to clinic per their request for continuation of care.   Olivia Canter Renal Navigator [phone]

## 2024-01-19 NOTE — Progress Notes (Signed)
 Patient was picked up by daughter to go home.

## 2024-01-22 ENCOUNTER — Ambulatory Visit: Payer: Medicare Other | Admitting: Orthopedic Surgery

## 2024-01-22 ENCOUNTER — Telehealth: Payer: Self-pay

## 2024-01-22 DIAGNOSIS — I10 Essential (primary) hypertension: Secondary | ICD-10-CM

## 2024-01-22 DIAGNOSIS — I5032 Chronic diastolic (congestive) heart failure: Secondary | ICD-10-CM

## 2024-01-22 DIAGNOSIS — E1122 Type 2 diabetes mellitus with diabetic chronic kidney disease: Secondary | ICD-10-CM

## 2024-01-23 ENCOUNTER — Encounter: Payer: Self-pay | Admitting: Internal Medicine

## 2024-01-24 ENCOUNTER — Ambulatory Visit (INDEPENDENT_AMBULATORY_CARE_PROVIDER_SITE_OTHER): Admitting: Family

## 2024-01-24 ENCOUNTER — Telehealth: Payer: Self-pay

## 2024-01-24 ENCOUNTER — Encounter: Payer: Self-pay | Admitting: Family

## 2024-01-24 DIAGNOSIS — L97923 Non-pressure chronic ulcer of unspecified part of left lower leg with necrosis of muscle: Secondary | ICD-10-CM

## 2024-01-24 DIAGNOSIS — L89612 Pressure ulcer of right heel, stage 2: Secondary | ICD-10-CM

## 2024-01-24 DIAGNOSIS — I890 Lymphedema, not elsewhere classified: Secondary | ICD-10-CM | POA: Diagnosis not present

## 2024-01-24 DIAGNOSIS — L97911 Non-pressure chronic ulcer of unspecified part of right lower leg limited to breakdown of skin: Secondary | ICD-10-CM

## 2024-01-24 NOTE — Telephone Encounter (Signed)
 [phone] [facility] pt had nail trim and did have some bleeding and a bandaid was applied. The pt called and states that it has bled through her bandage and that she is unable to reach her feet and needs a nurse to come out and change the dressing for her. The [facility] advised that they would contact the pt and go out and see her today

## 2024-01-24 NOTE — Progress Notes (Signed)
 Office Visit Note   Patient: Amy Moses           Date of Birth: 06-Jan-1971           MRN: [id number] Visit Date: 01/24/2024              Requested by: Lorelei Pont, DO [address] [place],  [place] [address] PCP: Lorelei Pont, DO  No chief complaint on file.     HPI: The patient is a 53 year old woman who is seen today in follow-up for venous and lymphatic insufficiency bilateral lower extremities with a new decubitus ulcer of the right heel.  This was open and draining about 2 weeks ago and led to her presentation to the emergency department she had subsequent hospitalization  The decubitus ulcer is improving.  She does have chronic osteomyelitis of the cuboid right foot.  Dr. Lajoyce Corners saw the patient during hospitalization and consultation operative management of the stable osteomyelitis would lead to below-knee amputation.  At this time the patient would like to continue with conservative measures  We have been attempting to get approval for lymphedema pumps her current pumps are the basic level and do not provide adequate lymphatic drainage.  Assessment & Plan: Visit Diagnoses: No diagnosis found.  Plan: We will continue with Dynaflex compression to bilateral lower extremities she will follow-up with [name] in 4 weeks Home health will assist with compression wraps in the interim.  She will continue with offloading boot for the heel on the right.  Follow-Up Instructions: No follow-ups on file.   Ortho Exam  Patient is alert, oriented, no adenopathy, well-dressed, normal affect, normal respiratory effort. On examination bilateral lower extremities there is good wrinkling of the skin from the compression wrapping she does have some remaining edema in the forefoot.  On examination right foot the decubitus ulcer of the heel is much improved there appears to be an area of pressure injury that remains that is about 6 cm in diameter.  The open area itself is only 1 cm in diameter  superficial filled in with 1% granulation.  Imaging: No results found. No images are attached to the encounter.  Labs: Lab Results  Component Value Date   HGBA1C 9.0 (H) 08/02/2023   HGBA1C 9.4 (H) 10/01/2022   HGBA1C 11.5 (H) 02/15/2022   ESRSEDRATE 7 08/03/2023   ESRSEDRATE 34 (H) 02/14/2022   ESRSEDRATE 40 (H) 08/07/2021   CRP 1.7 (H) 08/03/2023   CRP 7.0 (H) 02/14/2022   CRP 11.1 (H) 08/07/2021   REPTSTATUS 01/18/2024 FINAL 01/13/2024   GRAMSTAIN  07/08/2021    RARE WBC PRESENT,BOTH PMN AND MONONUCLEAR RARE GRAM POSITIVE RODS RARE GRAM POSITIVE COCCI IN PAIRS    CULT  01/13/2024    NO GROWTH 5 DAYS Performed at [facility], [address] [place], [place] [address]    Cindie Crumbly KOSERI 04/28/2022     Lab Results  Component Value Date   ALBUMIN 3.1 (L) 01/18/2024   ALBUMIN 3.2 (L) 01/16/2024   ALBUMIN 3.6 01/13/2024   PREALBUMIN 22 08/03/2023   PREALBUMIN 23 08/02/2023   PREALBUMIN 13.7 (L) 08/07/2021    Lab Results  Component Value Date   MG 2.2 08/03/2023   MG 1.9 03/24/2023   MG 2.4 10/01/2022   Lab Results  Component Value Date   VD25OH 36.9 12/30/2021    Lab Results  Component Value Date   PREALBUMIN 22 08/03/2023   PREALBUMIN 23 08/02/2023   PREALBUMIN 13.7 (L) 08/07/2021  Latest Ref Rng & Units 01/18/2024    8:36 AM 01/16/2024   12:56 PM 01/15/2024    4:59 AM  CBC EXTENDED  WBC 4.0 - 10.5 K/uL 8.2  7.5  7.1   RBC 3.87 - 5.11 MIL/uL 3.58  3.65  3.41   Hemoglobin 12.0 - 15.0 g/dL 95.2  84.1  9.9   HCT 32.4 - 46.0 % 32.8  33.0  31.3   Platelets 150 - 400 K/uL 141  151  142      There is no height or weight on file to calculate BMI.  Orders:  No orders of the defined types were placed in this encounter.  No orders of the defined types were placed in this encounter.    Procedures: No procedures performed  Clinical Data: No additional findings.  ROS:  All other systems negative, except as noted in the  HPI. Review of Systems  Objective: Vital Signs: LMP 08/22/2015 (Approximate)   Specialty Comments:  No specialty comments available.  PMFS History: Patient Active Problem List   Diagnosis Date Noted   Skin ulcer of right heel, limited to breakdown of skin (HCC) 01/15/2024   Ulcerated, foot (HCC) 01/13/2024   Shoulder pain 01/13/2024   Hypotension 01/13/2024   Anemia due to chronic kidney disease 11/01/2023   Clostridioides difficile diarrhea 10/30/2023   Rash 10/30/2023   Diarrhea 10/24/2023   Non-pressure chronic ulcer of other part of right foot limited to breakdown of skin (HCC) 08/08/2023   PAD (peripheral artery disease) (HCC) 08/08/2023   Acute osteomyelitis of right foot (HCC) 08/04/2023   OSA (obstructive sleep apnea) 08/02/2023   Prolapsed internal hemorrhoids, grade 3 03/29/2023   Thrombocytopenia (HCC) 03/21/2023   Mitral regurgitation 11/16/2022   Pressure injury of skin 10/01/2022   Pulmonary hypertension, unspecified (HCC) 09/12/2022   GERD (gastroesophageal reflux disease) 04/30/2022   Non-adherence to medical treatment 03/16/2022   Cutaneous abscess of right foot    Osteomyelitis of foot (HCC) 02/14/2022   Obesity (BMI 30-39.9) 01/26/2022   Ischemic ulcer of right foot (HCC) 01/24/2022   Anemia of chronic kidney failure, stage 5 (HCC) 01/24/2022   Sacral pressure ulcer 01/24/2022   Leg wound, left, sequela 12/10/2021   Open leg wound 09/03/2021   Wound infection    Non-pressure chronic ulcer of right calf limited to breakdown of skin (HCC)    Calciphylaxis of right lower extremity with nonhealing ulcer, limited to breakdown of skin (HCC)    Chronic ulcer of left thigh (HCC) 08/07/2021   Calciphylaxis of left lower extremity with nonhealing ulcer with necrosis of muscle (HCC) 08/07/2021   Chronic diastolic heart failure (HCC) 03/30/2021   ESRD (end stage renal disease) on dialysis (HCC) 11/16/2020   Calciphylaxis 11/06/2020   Non-healing open wound of  heel 11/03/2020   Diabetic foot infection (HCC) 11/01/2020   Decubitus ulcer, heel 11/01/2020   Closed nondisplaced fracture of left patella 10/29/2020   Acute on chronic renal failure (HCC) 06/10/2020   Anemia of chronic disease 06/10/2020   Vitamin D deficiency 01/28/2019   Type 2 diabetes mellitus with ESRD (end-stage renal disease) (HCC) 09/21/2015   Mixed hyperlipidemia 09/21/2015   Primary hypertension 09/21/2015   Acquired hypothyroidism 09/21/2015   Iris bomb 07/31/2012   Secondary angle-closure glaucoma 07/31/2012   Past Medical History:  Diagnosis Date   Anemia    Blindness of right eye with low vision in contralateral eye    s/p victrectomy   Chronic diastolic heart failure (HCC) 03/30/2021  Diabetes mellitus, type II (HCC)    Dyslipidemia    ESRD on hemodialysis (HCC)    TTS in [place] , [place]   Glaucoma    History of blood transfusion    Hypertension    Hypothyroidism (acquired)    Mitral regurgitation 11/16/2022   Pneumonia    Pulmonary hypertension, unspecified (HCC) 09/12/2022    Family History  Problem Relation Age of Onset   Heart failure Mother    Heart disease Mother    Diabetes Mother    Kidney disease Mother    Heart failure Father    Diabetes Father    Heart disease Father    Diabetes Brother    Heart failure Maternal Grandmother    Heart failure Maternal Grandfather    Transient ischemic attack Maternal Grandfather    Colon cancer Neg Hx     Past Surgical History:  Procedure Laterality Date   ABDOMINAL AORTOGRAM W/LOWER EXTREMITY Bilateral 12/18/2020   Procedure: ABDOMINAL AORTOGRAM W/LOWER EXTREMITY;  Surgeon: Sherren Kerns, MD;  Location: [facility];  Service: Cardiovascular;  Laterality: Bilateral;   ABDOMINAL AORTOGRAM W/LOWER EXTREMITY Bilateral 01/25/2022   Procedure: ABDOMINAL AORTOGRAM W/LOWER EXTREMITY;  Surgeon: Nada Libman, MD;  Location: [facility];  Service: Cardiovascular;  Laterality: Bilateral;    ABDOMINAL AORTOGRAM W/LOWER EXTREMITY Right 08/04/2023   Procedure: ABDOMINAL AORTOGRAM W/LOWER EXTREMITY;  Surgeon: Leonie Douglas, MD;  Location: [facility];  Service: Cardiovascular;  Laterality: Right;   AMPUTATION Right 02/16/2022   Procedure: RIGHT FOOT 5TH RAY AMPUTATION;  Surgeon: Nadara Mustard, MD;  Location: [facility] OR;  Service: Orthopedics;  Laterality: Right;   ANKLE FRACTURE SURGERY Right    AV FISTULA PLACEMENT Left 08/18/2020   Procedure: LEFT ARM BRACHIOCEPHALIC ARTERIOVENOUS (AV) FISTULA CREATION;  Surgeon: Sherren Kerns, MD;  Location: [facility] OR;  Service: Vascular;  Laterality: Left;   BIOPSY  04/24/2021   Procedure: BIOPSY;  Surgeon: Lanelle Bal, DO;  Location: [facility];  Service: Endoscopy;;   CESAREAN SECTION     CHOLECYSTECTOMY     COLONOSCOPY  04/24/2021   Surgeon: Lanelle Bal, DO;  nonbleeding internal hemorrhoids, 1 large (25 mm) pedunculated transverse colon polyp (prolapse type polyp) with adherent clot and stigmata of recent bleed.   COLONOSCOPY WITH PROPOFOL N/A 05/14/2021   Procedure: COLONOSCOPY WITH PROPOFOL;  Surgeon: Corbin Ade, MD;  Location: [facility];  Service: Endoscopy;  Laterality: N/A;   COLONOSCOPY WITH PROPOFOL N/A 03/17/2023   Procedure: COLONOSCOPY WITH PROPOFOL;  Surgeon: Corbin Ade, MD;  [facility]: [facility];  Service: Endoscopy;  Laterality: N/A;   ESOPHAGOGASTRODUODENOSCOPY (EGD) WITH PROPOFOL N/A 04/24/2021   Surgeon: Lanelle Bal, DO;  duodenal erosions and gastritis biopsied (pathology with peptic duodenitis, reactive gastropathy with erosions/chronic inflammation, negative for H. pylori)   EYE SURGERY     Vatrectomy   HEMOSTASIS CLIP PLACEMENT  05/14/2021   Procedure: HEMOSTASIS CLIP PLACEMENT;  Surgeon: Corbin Ade, MD;  [facility]: [facility];  Service: Endoscopy;;   IR PERC TUN PERIT CATH WO PORT S&I /IMAG  09/15/2020   IR REMOVAL TUN CV CATH W/O FL  02/19/2021   IR US GUIDE VASC ACCESS  RIGHT  09/15/2020   POLYPECTOMY  04/24/2021   Procedure: POLYPECTOMY;  Surgeon: Lanelle Bal, DO;  [facility];  Service: Endoscopy;;   POLYPECTOMY  05/14/2021   Procedure: POLYPECTOMY;  Surgeon: Corbin Ade, MD;  [facility];  Service: Endoscopy;;   POLYPECTOMY  03/17/2023   Procedure: POLYPECTOMY;  Surgeon: Corbin Ade, MD;  [facility]: [facility];  Service: Endoscopy;;   SKIN SPLIT GRAFT Bilateral 09/03/2021   Procedure: SKIN GRAFT BILATERAL LEGS;  Surgeon: Nadara Mustard, MD;  Location: [facility] OR;  Service: Orthopedics;  Laterality: Bilateral;   SKIN SPLIT GRAFT Left 12/10/2021   Procedure: IRRIGATION AND DEBRIDEMENT LEFT CALF, APPLICATION SPLIT THICKNESS SKIN GRAFT;  Surgeon: Nadara Mustard, MD;  Location: [facility] OR;  Service: Orthopedics;  Laterality: Left;   TOE SURGERY     Social History   Occupational History   Not on file  Tobacco Use   Smoking status: Never   Smokeless tobacco: Never  Vaping Use   Vaping status: Never Used  Substance and Sexual Activity   Alcohol use: No   Drug use: No   Sexual activity: Yes    Birth control/protection: Condom

## 2024-01-26 ENCOUNTER — Ambulatory Visit: Payer: Self-pay | Admitting: *Deleted

## 2024-01-26 NOTE — Patient Outreach (Signed)
 Care Coordination   01/26/2024 Name: [place]a Hedman MRN: [id number] DOB: 13-May-1971   Care Coordination Outreach Attempts:  An unsuccessful outreach was attempted for an appointment today.  Follow Up Plan:  Additional outreach attempts will be made to offer the patient complex care management information and services.   Encounter Outcome:  No Answer   Care Coordination Interventions:  No, not indicated    Aprille Sawhney L. Noelle Penner, RN, BSN, CCM [place]  [facility], [facility] RN Care Manager Direct Dial: [phone]  Fax: [phone]

## 2024-01-29 ENCOUNTER — Other Ambulatory Visit (HOSPITAL_COMMUNITY): Payer: Self-pay

## 2024-02-05 ENCOUNTER — Ambulatory Visit: Payer: Self-pay | Admitting: *Deleted

## 2024-02-05 NOTE — Patient Outreach (Signed)
 Care Coordination   02/05/2024 Name: Amy Moses MRN: [id number] DOB: [birth year]   Care Coordination Outreach Attempts:  A second unsuccessful outreach was attempted today to offer the patient with information about available complex care management services.  Follow Up Plan:  No further outreach attempts will be made at this time. We have been unable to contact the patient to offer or enroll patient in complex care management services.  Encounter Outcome:  No Answer   Care Coordination Interventions:  No, not indicated    Barnett Elzey L. Noelle Penner, RN, BSN, CCM [place]  [facility], [facility] RN Care Manager Direct Dial: [phone]  Fax: [phone]

## 2024-02-07 ENCOUNTER — Ambulatory Visit: Payer: Self-pay | Admitting: *Deleted

## 2024-02-07 NOTE — Patient Outreach (Signed)
 Care Coordination   02/07/2024 Name: Amy Moses MRN: [id number] DOB: Nov 02, 1971   Care Coordination Outreach Attempts:  A third unsuccessful outreach was attempted today to offer the patient with information about available complex care management services.  Follow Up Plan:  No further outreach attempts will be made at this time. We have been unable to contact the patient to offer or enroll patient in complex care management services.  Encounter Outcome:  No Answer   Care Coordination Interventions:  No, not indicated    Lakeitha Basques L. Noelle Penner, RN, BSN, CCM [place]  [facility], [facility] RN Care Manager Direct Dial: [phone]  Fax: [phone]

## 2024-02-16 ENCOUNTER — Encounter (HOSPITAL_COMMUNITY): Payer: Self-pay | Admitting: Emergency Medicine

## 2024-02-16 ENCOUNTER — Inpatient Hospital Stay (HOSPITAL_COMMUNITY)
Admission: EM | Admit: 2024-02-16 | Discharge: 2024-02-18 | DRG: 699 | Disposition: A | Discharge status: Home / Self Care | Attending: Internal Medicine | Admitting: Internal Medicine

## 2024-02-16 ENCOUNTER — Emergency Department (HOSPITAL_COMMUNITY)

## 2024-02-16 ENCOUNTER — Other Ambulatory Visit: Payer: Self-pay

## 2024-02-16 DIAGNOSIS — Z79890 Hormone replacement therapy (postmenopausal): Secondary | ICD-10-CM

## 2024-02-16 DIAGNOSIS — D696 Thrombocytopenia, unspecified: Secondary | ICD-10-CM | POA: Diagnosis present

## 2024-02-16 DIAGNOSIS — Z992 Dependence on renal dialysis: Secondary | ICD-10-CM

## 2024-02-16 DIAGNOSIS — E1122 Type 2 diabetes mellitus with diabetic chronic kidney disease: Principal | ICD-10-CM | POA: Diagnosis present

## 2024-02-16 DIAGNOSIS — M869 Osteomyelitis, unspecified: Secondary | ICD-10-CM | POA: Diagnosis present

## 2024-02-16 DIAGNOSIS — E875 Hyperkalemia: Secondary | ICD-10-CM | POA: Diagnosis present

## 2024-02-16 DIAGNOSIS — R197 Diarrhea, unspecified: Secondary | ICD-10-CM | POA: Diagnosis not present

## 2024-02-16 DIAGNOSIS — I5032 Chronic diastolic (congestive) heart failure: Secondary | ICD-10-CM | POA: Diagnosis present

## 2024-02-16 DIAGNOSIS — D631 Anemia in chronic kidney disease: Secondary | ICD-10-CM | POA: Diagnosis present

## 2024-02-16 DIAGNOSIS — N186 End stage renal disease: Secondary | ICD-10-CM | POA: Diagnosis not present

## 2024-02-16 DIAGNOSIS — E785 Hyperlipidemia, unspecified: Secondary | ICD-10-CM | POA: Diagnosis present

## 2024-02-16 DIAGNOSIS — E039 Hypothyroidism, unspecified: Secondary | ICD-10-CM | POA: Diagnosis present

## 2024-02-16 DIAGNOSIS — Z79899 Other long term (current) drug therapy: Secondary | ICD-10-CM

## 2024-02-16 DIAGNOSIS — Z993 Dependence on wheelchair: Secondary | ICD-10-CM

## 2024-02-16 DIAGNOSIS — G4733 Obstructive sleep apnea (adult) (pediatric): Secondary | ICD-10-CM | POA: Diagnosis present

## 2024-02-16 DIAGNOSIS — Z833 Family history of diabetes mellitus: Secondary | ICD-10-CM

## 2024-02-16 DIAGNOSIS — A0472 Enterocolitis due to Clostridium difficile, not specified as recurrent: Secondary | ICD-10-CM | POA: Diagnosis present

## 2024-02-16 DIAGNOSIS — I132 Hypertensive heart and chronic kidney disease with heart failure and with stage 5 chronic kidney disease, or end stage renal disease: Secondary | ICD-10-CM | POA: Diagnosis present

## 2024-02-16 DIAGNOSIS — I9589 Other hypotension: Secondary | ICD-10-CM | POA: Diagnosis present

## 2024-02-16 DIAGNOSIS — Z794 Long term (current) use of insulin: Secondary | ICD-10-CM

## 2024-02-16 DIAGNOSIS — L97411 Non-pressure chronic ulcer of right heel and midfoot limited to breakdown of skin: Secondary | ICD-10-CM | POA: Diagnosis present

## 2024-02-16 DIAGNOSIS — E119 Type 2 diabetes mellitus without complications: Secondary | ICD-10-CM | POA: Diagnosis present

## 2024-02-16 DIAGNOSIS — R5381 Other malaise: Secondary | ICD-10-CM | POA: Diagnosis present

## 2024-02-16 DIAGNOSIS — Z8249 Family history of ischemic heart disease and other diseases of the circulatory system: Secondary | ICD-10-CM

## 2024-02-16 DIAGNOSIS — E11649 Type 2 diabetes mellitus with hypoglycemia without coma: Secondary | ICD-10-CM | POA: Diagnosis present

## 2024-02-16 DIAGNOSIS — E11621 Type 2 diabetes mellitus with foot ulcer: Secondary | ICD-10-CM | POA: Diagnosis present

## 2024-02-16 LAB — CBC WITH DIFFERENTIAL/PLATELET
Abs Immature Granulocytes: 0.01 10*3/uL (ref 0.00–0.07)
Basophils Absolute: 0 10*3/uL (ref 0.0–0.1)
Basophils Relative: 1 %
Eosinophils Absolute: 0.3 10*3/uL (ref 0.0–0.5)
Eosinophils Relative: 4 %
HCT: 35.1 % — ABNORMAL LOW (ref 36.0–46.0)
Hemoglobin: 10.9 g/dL — ABNORMAL LOW (ref 12.0–15.0)
Immature Granulocytes: 0 %
Lymphocytes Relative: 12 %
Lymphs Abs: 0.8 10*3/uL (ref 0.7–4.0)
MCH: 29.1 pg (ref 26.0–34.0)
MCHC: 31.1 g/dL (ref 30.0–36.0)
MCV: 93.6 fL (ref 80.0–100.0)
Monocytes Absolute: 0.7 10*3/uL (ref 0.1–1.0)
Monocytes Relative: 11 %
Neutro Abs: 4.7 10*3/uL (ref 1.7–7.7)
Neutrophils Relative %: 72 %
Platelets: 89 10*3/uL — ABNORMAL LOW (ref 150–400)
RBC: 3.75 MIL/uL — ABNORMAL LOW (ref 3.87–5.11)
RDW: 14.6 % (ref 11.5–15.5)
Smear Review: DECREASED
WBC: 6.5 10*3/uL (ref 4.0–10.5)
nRBC: 0 % (ref 0.0–0.2)

## 2024-02-16 LAB — C DIFFICILE QUICK SCREEN W PCR REFLEX
C Diff antigen: NEGATIVE
C Diff interpretation: NOT DETECTED
C Diff toxin: NEGATIVE

## 2024-02-16 LAB — COMPREHENSIVE METABOLIC PANEL WITH GFR
ALT: 12 U/L (ref 0–44)
AST: 14 U/L — ABNORMAL LOW (ref 15–41)
Albumin: 4 g/dL (ref 3.5–5.0)
Alkaline Phosphatase: 95 U/L (ref 38–126)
Anion gap: 15 (ref 5–15)
BUN: 38 mg/dL — ABNORMAL HIGH (ref 6–20)
CO2: 21 mmol/L — ABNORMAL LOW (ref 22–32)
Calcium: 8.8 mg/dL — ABNORMAL LOW (ref 8.9–10.3)
Chloride: 101 mmol/L (ref 98–111)
Creatinine, Ser: 9.78 mg/dL — ABNORMAL HIGH (ref 0.44–1.00)
GFR, Estimated: 4 mL/min — ABNORMAL LOW (ref >60)
Glucose, Bld: 86 mg/dL (ref 70–99)
Potassium: 5.4 mmol/L — ABNORMAL HIGH (ref 3.5–5.1)
Sodium: 137 mmol/L (ref 135–145)
Total Bilirubin: 0.8 mg/dL (ref 0.0–1.2)
Total Protein: 7.1 g/dL (ref 6.5–8.1)

## 2024-02-16 LAB — LIPASE, BLOOD: Lipase: 26 U/L (ref 11–51)

## 2024-02-16 MED ORDER — ONDANSETRON HCL 4 MG/2ML IJ SOLN
4.0000 mg | Freq: Four times a day (QID) | INTRAMUSCULAR | Status: DC | PRN
  Administered 2024-02-16 – 2024-02-18 (×3): 4 mg via INTRAVENOUS
  Filled 2024-02-16 (×3): qty 2

## 2024-02-16 MED ORDER — INSULIN ASPART 100 UNIT/ML IJ SOLN
0.0000 [IU] | Freq: Three times a day (TID) | INTRAMUSCULAR | Status: DC
  Administered 2024-02-18: 1 [IU] via SUBCUTANEOUS

## 2024-02-16 MED ORDER — SODIUM ZIRCONIUM CYCLOSILICATE 10 G PO PACK
10.0000 g | PACK | Freq: Once | ORAL | Status: AC
  Administered 2024-02-16: 10 g via ORAL
  Filled 2024-02-16: qty 1

## 2024-02-16 MED ORDER — SODIUM CHLORIDE 0.9% FLUSH
3.0000 mL | Freq: Two times a day (BID) | INTRAVENOUS | Status: DC
  Administered 2024-02-17 – 2024-02-18 (×4): 3 mL via INTRAVENOUS

## 2024-02-16 MED ORDER — VANCOMYCIN HCL 125 MG PO CAPS
125.0000 mg | ORAL_CAPSULE | ORAL | Status: DC
  Filled 2024-02-16 (×2): qty 1

## 2024-02-16 MED ORDER — MIDODRINE HCL 5 MG PO TABS
10.0000 mg | ORAL_TABLET | Freq: Three times a day (TID) | ORAL | Status: DC
  Administered 2024-02-17 – 2024-02-18 (×5): 10 mg via ORAL
  Filled 2024-02-16 (×5): qty 2

## 2024-02-16 MED ORDER — ACETAMINOPHEN 650 MG RE SUPP: 650.0000 mg | Freq: Four times a day (QID) | RECTAL | Status: DC | PRN

## 2024-02-16 MED ORDER — ATORVASTATIN CALCIUM 10 MG PO TABS
10.0000 mg | ORAL_TABLET | Freq: Every evening | ORAL | Status: DC
  Administered 2024-02-17 – 2024-02-18 (×2): 10 mg via ORAL
  Filled 2024-02-16 (×3): qty 1

## 2024-02-16 MED ORDER — LEVOTHYROXINE SODIUM 75 MCG PO TABS
75.0000 ug | ORAL_TABLET | Freq: Every day | ORAL | Status: DC
  Administered 2024-02-17 – 2024-02-18 (×2): 75 ug via ORAL
  Filled 2024-02-16 (×2): qty 1

## 2024-02-16 MED ORDER — ONDANSETRON HCL 4 MG PO TABS: 4.0000 mg | ORAL_TABLET | Freq: Four times a day (QID) | ORAL | Status: DC | PRN

## 2024-02-16 MED ORDER — INSULIN ASPART 100 UNIT/ML IJ SOLN: 0.0000 [IU] | Freq: Every day | INTRAMUSCULAR | Status: DC

## 2024-02-16 MED ORDER — CHLORHEXIDINE GLUCONATE CLOTH 2 % EX PADS
6.0000 | MEDICATED_PAD | Freq: Every day | CUTANEOUS | Status: DC
  Administered 2024-02-17 – 2024-02-18 (×2): 6 via TOPICAL

## 2024-02-16 MED ORDER — ACETAMINOPHEN 325 MG PO TABS: 650.0000 mg | ORAL_TABLET | Freq: Four times a day (QID) | ORAL | Status: DC | PRN

## 2024-02-16 NOTE — ED Triage Notes (Signed)
 Pt bib pov w/ c/o diarrhea. Pt reports she has CDIFF and is on vancomycin. Pt reports the CDIFF had stopped but now it is back. She has had a few vomiting spells as well. This has caused her miss her dialysis yesterday and she is concerned she was going to miss it tomorrow too.

## 2024-02-16 NOTE — Progress Notes (Signed)
 Contacted by ED that pt missed HD on Thursday and feels that her diarrhea is too bad to go to her unit tomorrow.  She is being admitted under observation.  Will place HD orders for tomorrow but she should be able to be discharged to home afterwards if no further GI workup is needed.

## 2024-02-16 NOTE — Discharge Instructions (Addendum)
 You are seen in the ER today for diarrhea and abdominal discomfort.  Your blood work showed that your potassium was slightly high, just over the limit of normal.  It is important for you to go to dialysis tomorrow, your CT scan showed some thickening and inflammation of your small bowel, there are lots of different causes for this though this was present on your CT scan 2 years ago as well.  I spoke with Dr. Marletta Lor who is going to arrange for close outpatient follow-up.  Come back to the ER if you have new or worsening symptoms.  Your primary care doctor recheck your platelets as they were low today.

## 2024-02-16 NOTE — H&P (Signed)
 History and Physical    Amy Moses ZOX:[id number] DOB: 17-Dec-1970 DOA: 02/16/2024  PCP: Lorelei Pont, DO   Patient coming from: Home   Chief Complaint: Diarrhea, missed dialysis   HPI: Amy Moses is a 53 y.o. female with medical history significant for type 2 diabetes mellitus, ESRD on hemodialysis, chronic HFpEF, chronic lower extremity wounds, hypothyroidism, OSA on CPAP, and recurrent C. difficile colitis on vancomycin taper who presents with loose stools and missed dialysis.  Patient reports that she continues to have multiple loose stools per day.  She did not go to dialysis yesterday due to the loose stools.  She denies abdominal pain at this time but reports occasional fleeting pains.  She also denies nausea currently but states that she has had a couple episodes of nausea with vomiting.  She denies shortness of breath or chest pain.  She is concerned that she will miss dialysis again tomorrow due to her loose stools.  ED Course: Upon arrival to the ED, patient is found to be afebrile and saturating well on room air with normal heart rate and stable blood pressure.  Labs are most notable for potassium 5.4, bicarbonate 21, BUN 38, normal WBC, hemoglobin 10.8, and platelets 89,000.  GI (Dr. Marletta Lor) was consulted by the ED PA and recommended continued vancomycin taper and close outpatient follow-up.  Nephrology (Dr. Arrie Aran) was consulted with the ED PA and agreed to arrange for inpatient dialysis in the morning.  Patient was given a dose of Lokelma in the ED.  Review of Systems:  All other systems reviewed and apart from HPI, are negative.  Past Medical History:  Diagnosis Date   Anemia    Blindness of right eye with low vision in contralateral eye    s/p victrectomy   Chronic diastolic heart failure (HCC) 03/30/2021   Diabetes mellitus, type II (HCC)    Dyslipidemia    ESRD on hemodialysis (HCC)    TTS in [place] , [place]   Glaucoma    History of blood  transfusion    Hypertension    Hypothyroidism (acquired)    Mitral regurgitation 11/16/2022   Pneumonia    Pulmonary hypertension, unspecified (HCC) 09/12/2022    Past Surgical History:  Procedure Laterality Date   ABDOMINAL AORTOGRAM W/LOWER EXTREMITY Bilateral 12/18/2020   Procedure: ABDOMINAL AORTOGRAM W/LOWER EXTREMITY;  Surgeon: Sherren Kerns, MD;  Location: [facility];  Service: Cardiovascular;  Laterality: Bilateral;   ABDOMINAL AORTOGRAM W/LOWER EXTREMITY Bilateral 01/25/2022   Procedure: ABDOMINAL AORTOGRAM W/LOWER EXTREMITY;  Surgeon: Nada Libman, MD;  Location: [facility];  Service: Cardiovascular;  Laterality: Bilateral;   ABDOMINAL AORTOGRAM W/LOWER EXTREMITY Right 08/04/2023   Procedure: ABDOMINAL AORTOGRAM W/LOWER EXTREMITY;  Surgeon: Leonie Douglas, MD;  Location: [facility];  Service: Cardiovascular;  Laterality: Right;   AMPUTATION Right 02/16/2022   Procedure: RIGHT FOOT 5TH RAY AMPUTATION;  Surgeon: Nadara Mustard, MD;  Location: [facility] OR;  Service: Orthopedics;  Laterality: Right;   ANKLE FRACTURE SURGERY Right    AV FISTULA PLACEMENT Left 08/18/2020   Procedure: LEFT ARM BRACHIOCEPHALIC ARTERIOVENOUS (AV) FISTULA CREATION;  Surgeon: Sherren Kerns, MD;  Location: [facility] OR;  Service: Vascular;  Laterality: Left;   BIOPSY  04/24/2021   Procedure: BIOPSY;  Surgeon: Lanelle Bal, DO;  Location: [facility];  Service: Endoscopy;;   CESAREAN SECTION     CHOLECYSTECTOMY     COLONOSCOPY  04/24/2021   Surgeon: Lanelle Bal, DO;  nonbleeding internal  hemorrhoids, 1 large (25 mm) pedunculated transverse colon polyp (prolapse type polyp) with adherent clot and stigmata of recent bleed.   COLONOSCOPY WITH PROPOFOL N/A 05/14/2021   Procedure: COLONOSCOPY WITH PROPOFOL;  Surgeon: Corbin Ade, MD;  [facility]: [facility];  Service: Endoscopy;  Laterality: N/A;   COLONOSCOPY WITH PROPOFOL N/A 03/17/2023   Procedure: COLONOSCOPY WITH  PROPOFOL;  Surgeon: Corbin Ade, MD;  [facility]: [facility];  Service: Endoscopy;  Laterality: N/A;   ESOPHAGOGASTRODUODENOSCOPY (EGD) WITH PROPOFOL N/A 04/24/2021   Surgeon: Lanelle Bal, DO;  duodenal erosions and gastritis biopsied (pathology with peptic duodenitis, reactive gastropathy with erosions/chronic inflammation, negative for H. pylori)   EYE SURGERY     Vatrectomy   HEMOSTASIS CLIP PLACEMENT  05/14/2021   Procedure: HEMOSTASIS CLIP PLACEMENT;  Surgeon: Corbin Ade, MD;  [facility];  Service: Endoscopy;;   IR PERC TUN PERIT CATH WO PORT S&I /IMAG  09/15/2020   IR REMOVAL TUN CV CATH W/O FL  02/19/2021   IR US GUIDE VASC ACCESS RIGHT  09/15/2020   POLYPECTOMY  04/24/2021   Procedure: POLYPECTOMY;  Surgeon: Lanelle Bal, DO;  [facility];  Service: Endoscopy;;   POLYPECTOMY  05/14/2021   Procedure: POLYPECTOMY;  Surgeon: Corbin Ade, MD;  [facility];  Service: Endoscopy;;   POLYPECTOMY  03/17/2023   Procedure: POLYPECTOMY;  Surgeon: Corbin Ade, MD;  [facility];  Service: Endoscopy;;   SKIN SPLIT GRAFT Bilateral 09/03/2021   Procedure: SKIN GRAFT BILATERAL LEGS;  Surgeon: Nadara Mustard, MD;  Location: [facility] OR;  Service: Orthopedics;  Laterality: Bilateral;   SKIN SPLIT GRAFT Left 12/10/2021   Procedure: IRRIGATION AND DEBRIDEMENT LEFT CALF, APPLICATION SPLIT THICKNESS SKIN GRAFT;  Surgeon: Nadara Mustard, MD;  Location: [facility] OR;  Service: Orthopedics;  Laterality: Left;   TOE SURGERY      Social History:   reports that she has never smoked. She has never used smokeless tobacco. She reports that she does not drink alcohol and does not use drugs.  Allergies  Allergen Reactions   Ace Inhibitors Cough    Family History  Problem Relation Age of Onset   Heart failure Mother    Heart disease Mother    Diabetes Mother    Kidney disease Mother    Heart failure Father    Diabetes Father    Heart disease  Father    Diabetes Brother    Heart failure Maternal Grandmother    Heart failure Maternal Grandfather    Transient ischemic attack Maternal Grandfather    Colon cancer Neg Hx      Prior to Admission medications   Medication Sig Start Date End Date Taking? Authorizing Provider  atorvastatin (LIPITOR) 10 MG tablet Take 10 mg by mouth every evening.    [provider]  AURYXIA 1 GM 210 MG(Fe) tablet Take 630 mg by mouth 3 (three) times daily with meals. 01/07/22   [provider]  Blood Glucose Monitoring Suppl (ACCU-CHEK GUIDE ME) w/Device KIT 1 Piece by Does not apply route as directed. 03/16/22   Roma Kayser, MD  Blood Pressure Monitor KIT TAKE BLOOD PRESSURE DAILY 09/15/22   Chilton Si, MD  Continuous Blood Gluc Receiver (FREESTYLE LIBRE 2 READER) DEVI As directed 04/06/22   Roma Kayser, MD  Continuous Blood Gluc Sensor (FREESTYLE LIBRE 2 SENSOR) MISC 1 Piece by Does not apply route every 14 (fourteen) days. 04/06/22  Roma Kayser, MD  diphenoxylate-atropine (LOMOTIL) 2.5-0.025 MG tablet Take 2 tablets by mouth 4 (four) times daily as needed for diarrhea or loose stools.    [provider]  fluconazole (DIFLUCAN) 150 MG tablet Take 150 mg by mouth once. 02/06/24   [provider]  glucose blood (ACCU-CHEK GUIDE) test strip Use as instructed 03/16/22   Roma Kayser, MD  HUMALOG KWIKPEN 100 UNIT/ML KwikPen Inject 5-11 Units into the skin 3 (three) times daily with meals. If eats 50% or more of meal. Patient taking differently: Inject 5-11 Units into the skin 3 (three) times daily with meals. If eats 50% or more of meal. Sliding scale 03/16/22   Nida, Denman George, MD  insulin glargine (LANTUS) 100 UNIT/ML injection Inject 0.08 mLs (8 Units total) into the skin at bedtime. 01/19/24   Ghimire, Werner Lean, MD  levothyroxine (SYNTHROID) 75 MCG tablet Take 75 mcg by mouth daily. 06/13/22   [provider]   midodrine (PROAMATINE) 10 MG tablet Take 1 tablet (10 mg total) by mouth 3 (three) times daily. 01/19/24   Ghimire, Werner Lean, MD  ondansetron (ZOFRAN-ODT) 4 MG disintegrating tablet Take 1 tablet (4 mg total) by mouth daily as needed for nausea or vomiting. Only use for severe nausea/vomiting while on Dificid. 11/10/23   Aida Raider, NP  pantoprazole (PROTONIX) 40 MG tablet Take 1 tablet (40 mg total) by mouth 2 (two) times daily. 04/25/21   Shon Hale, MD  vancomycin (VANCOCIN) 125 MG capsule Take 1 capsule (125mg ) by mouth 3 times daily AND at BEDTIME-until 3/10, then 1 capsule (125 mg) 3 times daily for 7 days, then 1 capsule (125mg ) once daily for 7 days, then 1 capsule (125mg ) every OTHER day for 28 days, then 1 capsule (125mg ) EVERY 3 DAYS for 28 days. 01/19/24   Ghimire, Werner Lean, MD  Vitamin D, Ergocalciferol, (DRISDOL) 1.25 MG (50000 UNIT) CAPS capsule Take 50,000 Units by mouth every 7 (seven) days. Friday 04/20/21   [provider]    Physical Exam: Vitals:   02/16/24 1815 02/16/24 1822 02/16/24 1830 02/16/24 1845  BP: (!) 94/57 (!) 94/57 101/65 98/61  Pulse: 67 67 68 70  Resp:  18    Temp:  98.2 F (36.8 C)    TempSrc:  Oral    SpO2: 94% 94% 96% 95%  Weight:      Height:        Constitutional: NAD, no pallor or diaphoresis   Eyes: PERTLA, lids and conjunctivae normal ENMT: Mucous membranes are moist. Posterior pharynx clear of any exudate or lesions.   Neck: supple, no masses  Respiratory: no wheezing, no crackles. No accessory muscle use.  Cardiovascular: S1 & S2 heard, regular rate and rhythm. Mild b/l LE edema.  Abdomen: No tenderness, soft. Bowel sounds active.  Musculoskeletal: no clubbing / cyanosis. No joint deformity upper and lower extremities.   Skin: no significant rashes, lesions, ulcers. Warm, dry, well-perfused. Neurologic: CN 2-12 grossly intact. Moving all extremities. Alert and oriented.  Psychiatric: Calm. Cooperative.    Labs and  Imaging on Admission: I have personally reviewed following labs and imaging studies  CBC: Recent Labs  Lab 02/16/24 1409  WBC 6.5  NEUTROABS 4.7  HGB 10.9*  HCT 35.1*  MCV 93.6  PLT 89*   Basic Metabolic Panel: Recent Labs  Lab 02/16/24 1409  NA 137  K 5.4*  CL 101  CO2 21*  GLUCOSE 86  BUN 38*  CREATININE 9.78*  CALCIUM  8.8*   GFR: Estimated Creatinine Clearance: 7.8 mL/min (A) (by C-G formula based on SCr of 9.78 mg/dL (H)). Liver Function Tests: Recent Labs  Lab 02/16/24 1409  AST 14*  ALT 12  ALKPHOS 95  BILITOT 0.8  PROT 7.1  ALBUMIN 4.0   Recent Labs  Lab 02/16/24 1409  LIPASE 26   No results for input(s): "AMMONIA" in the last 168 hours. Coagulation Profile: No results for input(s): "INR", "PROTIME" in the last 168 hours. Cardiac Enzymes: No results for input(s): "CKTOTAL", "CKMB", "CKMBINDEX", "TROPONINI" in the last 168 hours. BNP (last 3 results) No results for input(s): "PROBNP" in the last 8760 hours. HbA1C: No results for input(s): "HGBA1C" in the last 72 hours. CBG: No results for input(s): "GLUCAP" in the last 168 hours. Lipid Profile: No results for input(s): "CHOL", "HDL", "LDLCALC", "TRIG", "CHOLHDL", "LDLDIRECT" in the last 72 hours. Thyroid Function Tests: No results for input(s): "TSH", "T4TOTAL", "FREET4", "T3FREE", "THYROIDAB" in the last 72 hours. Anemia Panel: No results for input(s): "VITAMINB12", "FOLATE", "FERRITIN", "TIBC", "IRON", "RETICCTPCT" in the last 72 hours. Urine analysis: No results found for: "COLORURINE", "APPEARANCEUR", "LABSPEC", "PHURINE", "GLUCOSEU", "HGBUR", "BILIRUBINUR", "KETONESUR", "PROTEINUR", "UROBILINOGEN", "NITRITE", "LEUKOCYTESUR" Sepsis Labs: @LABRCNTIP (procalcitonin:4,lacticidven:4) ) Recent Results (from the past 240 hours)  C Difficile Quick Screen w PCR reflex     Status: None   Collection Time: 02/16/24  2:55 PM   Specimen: Stool  Result Value Ref Range Status   C Diff antigen NEGATIVE  NEGATIVE Final   C Diff toxin NEGATIVE NEGATIVE Final   C Diff interpretation No C. difficile detected.  Final    Comment: Performed at [facility], [address], [place], [place] [address]     Radiological Exams on Admission: CT ABDOMEN PELVIS WO CONTRAST Result Date: 02/16/2024 CLINICAL DATA:  Diarrhea.  Vomiting. EXAM: CT ABDOMEN AND PELVIS WITHOUT CONTRAST TECHNIQUE: Multidetector CT imaging of the abdomen and pelvis was performed following the standard protocol without IV contrast. RADIATION DOSE REDUCTION: This exam was performed according to the departmental dose-optimization program which includes automated exposure control, adjustment of the mA and/or kV according to patient size and/or use of iterative reconstruction technique. COMPARISON:  September 30, 2022. FINDINGS: Lower chest: No acute abnormality. Hepatobiliary: No focal liver abnormality is seen. Status post cholecystectomy. No biliary dilatation. Pancreas: Unremarkable. No pancreatic ductal dilatation or surrounding inflammatory changes. Spleen: Normal in size without focal abnormality. Adrenals/Urinary Tract: Adrenal glands appear normal. Bilateral renal atrophy is noted consistent with history of end-stage renal disease. No hydronephrosis or renal obstruction is noted. Urinary bladder is decompressed. Stomach/Bowel: Stomach is within normal limits. Appendix appears normal. There is no evidence of bowel obstruction. There is noted moderate wall thickening involving proximal small bowel loops which was present on prior exam, but potentially may represent enteritis. Vascular/Lymphatic: Aortic atherosclerosis. No enlarged abdominal or pelvic lymph nodes. Reproductive: Calcified uterine fibroid is noted. No definite adnexal abnormality is noted. Other: No ascites or hernia is noted. Musculoskeletal: No acute or significant osseous findings. IMPRESSION: Moderate wall thickening is seen involving proximal small bowel loops which was present on  prior exam and potentially may represent enteritis or other inflammation. There is no definite evidence of bowel obstruction. Calcified uterine fibroid is again noted. Aortic Atherosclerosis (ICD10-I70.0). Electronically Signed   By: Lupita Raider M.D.   On: 02/16/2024 16:04    Assessment/Plan   1. ESRD; hyperkalemia  - Nephrology consultation appreciated, she is planned for dialysis in the hospital in the morning  - SLIV,  restrict fluids, renally-dose medications, repeat chem panel    2. C difficile colitis  - GI recommendations are appreciated  - Continue oral vancomycin taper, close outpatient follow-up    3. Type II DM  - A1c was 9.0% in September 2024  - Check CBGs and use low-intensity SSI only for now   4. Chronic hypotension  - Continue midodrine    5. Hypothyroidism  - Synthroid   6. Chronic wounds   - Wound care   7. Thrombocytopenia  - Chronic, no overt bleeding    8. OSA  - CPAP while sleeping     DVT prophylaxis: SCDs  Code Status: Full  Level of Care: Level of care: Telemetry Family Communication: None present   Disposition Plan:  Patient is from: home  Anticipated d/c is to: Home  Anticipated d/c date is: 02/17/24  Patient currently: Pending clinical stability Consults called: Nephrology  Admission status: observation     Briscoe Deutscher, MD [facility]  02/16/2024, 7:20 PM

## 2024-02-16 NOTE — Progress Notes (Signed)
 Patient brought home CPAP to hospital.  Set up patient's unit at bedside, Cords are in good shape.  Filled well with sterile water and made sure unit is ready for patient use.

## 2024-02-16 NOTE — Progress Notes (Addendum)
   02/16/24 2041  TOC Brief Assessment  Insurance and Status Reviewed  Patient has primary care physician Yes  Home environment has been reviewed From home.  Prior level of function: Independent.  Social Drivers of Health Review SDOH reviewed no interventions necessary  Readmission risk has been reviewed Yes  Transition of care needs no transition of care needs at this time   Transition of Care Department [facility]) has reviewed patient and no other TOC needs have been identified at this time. We will continue to monitor patient advancement through interdisciplinary progression rounds. If new patient needs arise, please place a TOC consult.

## 2024-02-16 NOTE — ED Provider Notes (Addendum)
 [place] [facility] DEPARTMENT AT [facility] Provider Note   CSN: [id number] Arrival date & time: 02/16/24  1221     History  Chief Complaint  Patient presents with  . Diarrhea    Amy Moses is a 53 y.o. female.  She reports history of ESRD on dialysis Tuesday Thursday Saturday, chronic leg wounds and osteomyelitis follows with Dr. Lajoyce Corners in [place], diabetes, hypertension and CHF with fluid restriction of 1200 mL daily.  Is ER today complaining of abdominal discomfort, watery stools several times a day every time she eats or drinks anything and nausea without vomiting.  Also notes that she has had a lot of flatulence.  Denies fever or chills, chest pain shortness of breath, no back pain, other complaints.  She states the diarrhea feels similar to when she had C. difficile colitis.  She was hospitalized for this in December 2024 and subsequently again in February.  Discharged on February 2025 on oral vancomycin which she is continuing on.  Denies any other recent antibiotics.  She is concerned because she had missed dialysis yesterday due to the frequency of her bowel movements and feeling generally unwell.  Worried that she will have to miss again tomorrow.  She tried to get into her gastroenterologist office but was unable to be seen until next week.   Diarrhea      Home Medications Prior to Admission medications   Medication Sig Start Date End Date Taking? Authorizing Provider  atorvastatin (LIPITOR) 10 MG tablet Take 10 mg by mouth every evening.    [provider]  AURYXIA 1 GM 210 MG(Fe) tablet Take 630 mg by mouth 3 (three) times daily with meals. 01/07/22   [provider]  Blood Glucose Monitoring Suppl (ACCU-CHEK GUIDE ME) w/Device KIT 1 Piece by Does not apply route as directed. 03/16/22   Roma Kayser, MD  Blood Pressure Monitor KIT TAKE BLOOD PRESSURE DAILY 09/15/22   Chilton Si, MD  Continuous Blood Gluc Receiver  (FREESTYLE LIBRE 2 READER) DEVI As directed 04/06/22   Roma Kayser, MD  Continuous Blood Gluc Sensor (FREESTYLE LIBRE 2 SENSOR) MISC 1 Piece by Does not apply route every 14 (fourteen) days. 04/06/22   Roma Kayser, MD  diphenoxylate-atropine (LOMOTIL) 2.5-0.025 MG tablet Take 2 tablets by mouth 4 (four) times daily as needed for diarrhea or loose stools.    [provider]  fluconazole (DIFLUCAN) 150 MG tablet Take 150 mg by mouth once. 02/06/24   [provider]  glucose blood (ACCU-CHEK GUIDE) test strip Use as instructed 03/16/22   Roma Kayser, MD  HUMALOG KWIKPEN 100 UNIT/ML KwikPen Inject 5-11 Units into the skin 3 (three) times daily with meals. If eats 50% or more of meal. Patient taking differently: Inject 5-11 Units into the skin 3 (three) times daily with meals. If eats 50% or more of meal. Sliding scale 03/16/22   Nida, Denman George, MD  insulin glargine (LANTUS) 100 UNIT/ML injection Inject 0.08 mLs (8 Units total) into the skin at bedtime. 01/19/24   Ghimire, Werner Lean, MD  levothyroxine (SYNTHROID) 75 MCG tablet Take 75 mcg by mouth daily. 06/13/22   [provider]  midodrine (PROAMATINE) 10 MG tablet Take 1 tablet (10 mg total) by mouth 3 (three) times daily. 01/19/24   Ghimire, Werner Lean, MD  ondansetron (ZOFRAN-ODT) 4 MG disintegrating tablet Take 1 tablet (4 mg total) by mouth daily as needed for nausea or vomiting. Only use for severe nausea/vomiting while on  Dificid. 11/10/23   Aida Raider, NP  pantoprazole (PROTONIX) 40 MG tablet Take 1 tablet (40 mg total) by mouth 2 (two) times daily. 04/25/21   Shon Hale, MD  vancomycin (VANCOCIN) 125 MG capsule Take 1 capsule (125mg ) by mouth 3 times daily AND at BEDTIME-until 3/10, then 1 capsule (125 mg) 3 times daily for 7 days, then 1 capsule (125mg ) once daily for 7 days, then 1 capsule (125mg ) every OTHER day for 28 days, then 1 capsule (125mg ) EVERY 3 DAYS for 28 days. 01/19/24    Ghimire, Werner Lean, MD  Vitamin D, Ergocalciferol, (DRISDOL) 1.25 MG (50000 UNIT) CAPS capsule Take 50,000 Units by mouth every 7 (seven) days. Friday 04/20/21   [provider]      Allergies    Ace inhibitors    Review of Systems   Review of Systems  Gastrointestinal:  Positive for diarrhea.    Physical Exam Updated Vital Signs BP 119/79   Pulse 65   Temp 98.2 F (36.8 C) (Oral)   Resp 13   Ht 5\' 6"  (1.676 m)   Wt 90.7 kg   LMP 08/22/2015 (Approximate)   SpO2 99%   BMI 32.28 kg/m  Physical Exam Vitals and nursing note reviewed.  Constitutional:      General: She is not in acute distress.    Appearance: She is well-developed.  HENT:     Head: Normocephalic and atraumatic.     Mouth/Throat:     Mouth: Mucous membranes are moist.  Eyes:     Extraocular Movements: Extraocular movements intact.     Conjunctiva/sclera: Conjunctivae normal.     Pupils: Pupils are equal, round, and reactive to light.  Cardiovascular:     Rate and Rhythm: Normal rate and regular rhythm.     Heart sounds: No murmur heard. Pulmonary:     Effort: Pulmonary effort is normal. No respiratory distress.     Breath sounds: Normal breath sounds.  Abdominal:     Palpations: Abdomen is soft. There is no mass.     Tenderness: There is no abdominal tenderness.     Comments: Mild distension with tympany on percussion   Musculoskeletal:        General: No swelling.     Cervical back: Neck supple.  Skin:    General: Skin is warm and dry.     Capillary Refill: Capillary refill takes less than 2 seconds.  Neurological:     General: No focal deficit present.     Mental Status: She is alert and oriented to person, place, and time.  Psychiatric:        Mood and Affect: Mood normal.     ED Results / Procedures / Treatments   Labs (all labs ordered are listed, but only abnormal results are displayed) Labs Reviewed  CBC WITH DIFFERENTIAL/PLATELET - Abnormal; Notable for the following  components:      Result Value   RBC 3.75 (*)    Hemoglobin 10.9 (*)    HCT 35.1 (*)    Platelets 89 (*)    All other components within normal limits  COMPREHENSIVE METABOLIC PANEL WITH GFR - Abnormal; Notable for the following components:   Potassium 5.4 (*)    CO2 21 (*)    BUN 38 (*)    Creatinine, Ser 9.78 (*)    Calcium 8.8 (*)    AST 14 (*)    GFR, Estimated 4 (*)    All other components within normal limits  C  DIFFICILE QUICK SCREEN W PCR REFLEX    GASTROINTESTINAL PANEL BY PCR, STOOL (REPLACES STOOL CULTURE)  LIPASE, BLOOD  URINALYSIS, ROUTINE W REFLEX MICROSCOPIC    EKG None  Radiology CT ABDOMEN PELVIS WO CONTRAST Result Date: 02/16/2024 CLINICAL DATA:  Diarrhea.  Vomiting. EXAM: CT ABDOMEN AND PELVIS WITHOUT CONTRAST TECHNIQUE: Multidetector CT imaging of the abdomen and pelvis was performed following the standard protocol without IV contrast. RADIATION DOSE REDUCTION: This exam was performed according to the departmental dose-optimization program which includes automated exposure control, adjustment of the mA and/or kV according to patient size and/or use of iterative reconstruction technique. COMPARISON:  September 30, 2022. FINDINGS: Lower chest: No acute abnormality. Hepatobiliary: No focal liver abnormality is seen. Status post cholecystectomy. No biliary dilatation. Pancreas: Unremarkable. No pancreatic ductal dilatation or surrounding inflammatory changes. Spleen: Normal in size without focal abnormality. Adrenals/Urinary Tract: Adrenal glands appear normal. Bilateral renal atrophy is noted consistent with history of end-stage renal disease. No hydronephrosis or renal obstruction is noted. Urinary bladder is decompressed. Stomach/Bowel: Stomach is within normal limits. Appendix appears normal. There is no evidence of bowel obstruction. There is noted moderate wall thickening involving proximal small bowel loops which was present on prior exam, but potentially may represent  enteritis. Vascular/Lymphatic: Aortic atherosclerosis. No enlarged abdominal or pelvic lymph nodes. Reproductive: Calcified uterine fibroid is noted. No definite adnexal abnormality is noted. Other: No ascites or hernia is noted. Musculoskeletal: No acute or significant osseous findings. IMPRESSION: Moderate wall thickening is seen involving proximal small bowel loops which was present on prior exam and potentially may represent enteritis or other inflammation. There is no definite evidence of bowel obstruction. Calcified uterine fibroid is again noted. Aortic Atherosclerosis (ICD10-I70.0). Electronically Signed   By: Lupita Raider M.D.   On: 02/16/2024 16:04    Procedures Procedures    Medications Ordered in ED Medications - No data to display  ED Course/ Medical Decision Making/ A&P Clinical Course as of 02/16/24 1851  Fri Feb 16, 2024  1503 Here with abdominal discomfort nausea and diarrhea in the setting of recent C. difficile colitis being treated with oral vancomycin.  He has not had any imaging since having difficulties with the C. difficile in December and the abdominal discomfort and tenderness is new as well as the vomiting so we will order CT for further evaluation to ensure there is not underlying disease such as diverticulitis or partial obstruction in the setting of nausea and discomfort.  Labs show ESRD, potassium is 5.4, hemoglobin is at her baseline, no leukocytosis but platelets are decreased today from most recent but were lower during her February admission. [CB]  1849 Did provide stool sample, while awaiting for transfer home she had called at the phone number for Dr. Marletta Lor and spoke with him on the phone and he contact me back.  She reports that patient is stating that she does not feel like she can go to dialysis tomorrow and does not feel comfortable going home he related this to me.  She had reported to him that she will just come back if she misses dialysis again.  I called  and spoke with Dr. Arrie Aran, but patient in observation status he will put her in for dialysis tomorrow [CB]    Clinical Course User Index [CB] Carmel Sacramento A, PA-C  Medical Decision Making This patient presents to the ED for concern of diarrhea/abdominal pain, this involves an extensive number of treatment options, and is a complaint that carries with it a high risk of complications and morbidity.  The differential diagnosis includes C. difficile infection, colitis, diverticulitis, gastroenteritis, IBS, IBD, other   Co morbidities that complicate the patient evaluation :   ESRD on HD Tuesday Thursday Saturday, DM, hypertension   Additional history obtained:  Additional history obtained from EMR External records from outside source obtained and reviewed including prior notes and labs   Lab Tests:  I Ordered, and personally interpreted labs.  The pertinent results include: Potassium 5.4, hemoglobin baseline, no leukocytosis, platelets decreased at 89.   Imaging Studies ordered:  I ordered imaging studies including CT abdomen pelvis which shows thickened small bowel loops I independently visualized and interpreted imaging within scope of identifying emergent findings  I agree with the radiologist interpretation   Consultations Obtained:  I requested consultation with the gastroenterologist Dr. Marletta Lor,  and discussed lab and imaging findings as well as pertinent plan - they recommend: Outpatient follow-up, continue the vancomycin   Problem List / ED Course / Critical interventions / Medication management  Diarrhea-patient is on vancomycin for C. difficile from recent hospitalization and has been compliant, started having some abdominal discomfort nausea, had vomiting at home though no longer vomiting and having diarrhea when she eats.  This caused her to miss dialysis yesterday.  Labs overall reassuring, platelets are decreased that have been  lower in the past down to a low of 61.  Will recommend follow-up with PCP for recheck.  Potassium slightly elevated at 5.4 will dialyze tomorrow morning, no indication for admission, discussed risk and benefits of staying in hospital but given that she has not been having diarrhea here, looks well, she is ready to go home.  I discussed the case with Dr. Marletta Lor who is going to arrange close outpatient follow-up early next week, CT showed some thickened small bowel loops.   He is tolerated p.o. fluids and crackers without difficulty.  She was given strict return precautions.  Concerns and benefits of providing p.o. dose of Lokelma given the slightly elevated potassium, given that she has been having diarrhea or any, discussed with ED attending.  We do not feel this would be beneficial as she is going to be dialyzing at 5 AM.  Patient then stated did not feel comfortable leaving, as above in ED course will admit for dialysis after discussion with nephrology.       Amount and/or Complexity of Data Reviewed Labs: ordered. Radiology: ordered.  Risk Prescription drug management. Decision regarding hospitalization.           Final Clinical Impression(s) / ED Diagnoses Final diagnoses:  Diarrhea, unspecified type    Rx / DC Orders ED Discharge Orders     None         Ma Rings, PA-C 02/16/24 [address] [place] 02/16/24 1851    Bethann Berkshire, MD 02/18/24 1150

## 2024-02-16 NOTE — Care Management Obs Status (Signed)
 MEDICARE OBSERVATION STATUS NOTIFICATION   Patient Details  Name: Amy Moses MRN: [id number] Date of Birth: 03-Feb-1971   Medicare Observation Status Notification Given:  Yes    Barron Alvine, RN 02/16/2024, 7:44 PM

## 2024-02-17 ENCOUNTER — Other Ambulatory Visit: Payer: Self-pay | Admitting: Internal Medicine

## 2024-02-17 DIAGNOSIS — I5032 Chronic diastolic (congestive) heart failure: Secondary | ICD-10-CM | POA: Diagnosis present

## 2024-02-17 DIAGNOSIS — R197 Diarrhea, unspecified: Secondary | ICD-10-CM | POA: Diagnosis present

## 2024-02-17 DIAGNOSIS — Z833 Family history of diabetes mellitus: Secondary | ICD-10-CM | POA: Diagnosis not present

## 2024-02-17 DIAGNOSIS — N186 End stage renal disease: Secondary | ICD-10-CM | POA: Diagnosis present

## 2024-02-17 DIAGNOSIS — Z79890 Hormone replacement therapy (postmenopausal): Secondary | ICD-10-CM | POA: Diagnosis not present

## 2024-02-17 DIAGNOSIS — Z993 Dependence on wheelchair: Secondary | ICD-10-CM | POA: Diagnosis not present

## 2024-02-17 DIAGNOSIS — A0472 Enterocolitis due to Clostridium difficile, not specified as recurrent: Secondary | ICD-10-CM | POA: Diagnosis present

## 2024-02-17 DIAGNOSIS — Z8249 Family history of ischemic heart disease and other diseases of the circulatory system: Secondary | ICD-10-CM | POA: Diagnosis not present

## 2024-02-17 DIAGNOSIS — E1122 Type 2 diabetes mellitus with diabetic chronic kidney disease: Secondary | ICD-10-CM | POA: Diagnosis present

## 2024-02-17 DIAGNOSIS — L97411 Non-pressure chronic ulcer of right heel and midfoot limited to breakdown of skin: Secondary | ICD-10-CM | POA: Diagnosis present

## 2024-02-17 DIAGNOSIS — G4733 Obstructive sleep apnea (adult) (pediatric): Secondary | ICD-10-CM | POA: Diagnosis present

## 2024-02-17 DIAGNOSIS — E875 Hyperkalemia: Secondary | ICD-10-CM | POA: Diagnosis present

## 2024-02-17 DIAGNOSIS — Z794 Long term (current) use of insulin: Secondary | ICD-10-CM | POA: Diagnosis not present

## 2024-02-17 DIAGNOSIS — D696 Thrombocytopenia, unspecified: Secondary | ICD-10-CM | POA: Diagnosis present

## 2024-02-17 DIAGNOSIS — Z79899 Other long term (current) drug therapy: Secondary | ICD-10-CM | POA: Diagnosis not present

## 2024-02-17 DIAGNOSIS — R5381 Other malaise: Secondary | ICD-10-CM | POA: Diagnosis present

## 2024-02-17 DIAGNOSIS — E039 Hypothyroidism, unspecified: Secondary | ICD-10-CM | POA: Diagnosis present

## 2024-02-17 DIAGNOSIS — E11621 Type 2 diabetes mellitus with foot ulcer: Secondary | ICD-10-CM | POA: Diagnosis present

## 2024-02-17 DIAGNOSIS — E11649 Type 2 diabetes mellitus with hypoglycemia without coma: Secondary | ICD-10-CM | POA: Diagnosis present

## 2024-02-17 DIAGNOSIS — E785 Hyperlipidemia, unspecified: Secondary | ICD-10-CM | POA: Diagnosis present

## 2024-02-17 DIAGNOSIS — Z992 Dependence on renal dialysis: Secondary | ICD-10-CM | POA: Diagnosis not present

## 2024-02-17 DIAGNOSIS — I132 Hypertensive heart and chronic kidney disease with heart failure and with stage 5 chronic kidney disease, or end stage renal disease: Secondary | ICD-10-CM | POA: Diagnosis present

## 2024-02-17 DIAGNOSIS — M869 Osteomyelitis, unspecified: Secondary | ICD-10-CM | POA: Diagnosis present

## 2024-02-17 DIAGNOSIS — D631 Anemia in chronic kidney disease: Secondary | ICD-10-CM | POA: Diagnosis present

## 2024-02-17 DIAGNOSIS — I9589 Other hypotension: Secondary | ICD-10-CM | POA: Diagnosis present

## 2024-02-17 LAB — GASTROINTESTINAL PANEL BY PCR, STOOL (REPLACES STOOL CULTURE)

## 2024-02-17 LAB — BASIC METABOLIC PANEL WITH GFR
Anion gap: 14 (ref 5–15)
BUN: 43 mg/dL — ABNORMAL HIGH (ref 6–20)
CO2: 23 mmol/L (ref 22–32)
Calcium: 9.1 mg/dL (ref 8.9–10.3)
Chloride: 101 mmol/L (ref 98–111)
Creatinine, Ser: 10.41 mg/dL — ABNORMAL HIGH (ref 0.44–1.00)
GFR, Estimated: 4 mL/min — ABNORMAL LOW (ref >60)
Glucose, Bld: 143 mg/dL — ABNORMAL HIGH (ref 70–99)
Potassium: 5.8 mmol/L — ABNORMAL HIGH (ref 3.5–5.1)
Sodium: 138 mmol/L (ref 135–145)

## 2024-02-17 LAB — CBC
HCT: 37.1 % (ref 36.0–46.0)
Hemoglobin: 11.2 g/dL — ABNORMAL LOW (ref 12.0–15.0)
MCH: 28.9 pg (ref 26.0–34.0)
MCHC: 30.2 g/dL (ref 30.0–36.0)
MCV: 95.6 fL (ref 80.0–100.0)
Platelets: 74 10*3/uL — ABNORMAL LOW (ref 150–400)
RBC: 3.88 MIL/uL (ref 3.87–5.11)
RDW: 14.6 % (ref 11.5–15.5)
WBC: 6.8 10*3/uL (ref 4.0–10.5)
nRBC: 0 % (ref 0.0–0.2)

## 2024-02-17 LAB — HEMOGLOBIN A1C
Hgb A1c MFr Bld: 5.6 % (ref 4.8–5.6)
Mean Plasma Glucose: 114.02 mg/dL

## 2024-02-17 LAB — GLUCOSE, CAPILLARY
Glucose-Capillary: 116 mg/dL — ABNORMAL HIGH (ref 70–99)
Glucose-Capillary: 86 mg/dL (ref 70–99)
Glucose-Capillary: 123 mg/dL — ABNORMAL HIGH (ref 70–99)

## 2024-02-17 LAB — HEPATITIS B SURFACE ANTIGEN: Hepatitis B Surface Ag: NONREACTIVE

## 2024-02-17 MED ORDER — SIMETHICONE 80 MG PO CHEW: 80.0000 mg | CHEWABLE_TABLET | Freq: Four times a day (QID) | ORAL | Status: DC | PRN

## 2024-02-17 MED ORDER — LOPERAMIDE HCL 2 MG PO CAPS: 4.0000 mg | ORAL_CAPSULE | ORAL | Status: DC | PRN

## 2024-02-17 MED ORDER — LIDOCAINE-PRILOCAINE 2.5-2.5 % EX CREA: 1.0000 | TOPICAL_CREAM | CUTANEOUS | Status: DC | PRN

## 2024-02-17 MED ORDER — VANCOMYCIN HCL 125 MG PO CAPS
125.0000 mg | ORAL_CAPSULE | Freq: Every day | ORAL | Status: DC
  Administered 2024-02-18: 125 mg via ORAL
  Filled 2024-02-17: qty 1

## 2024-02-17 MED ORDER — CHOLESTYRAMINE 4 G PO PACK: 4.0000 g | PACK | Freq: Every day | ORAL | 2 refills | Status: DC

## 2024-02-17 MED ORDER — CHOLESTYRAMINE LIGHT 4 G PO PACK
4.0000 g | PACK | Freq: Two times a day (BID) | ORAL | Status: DC
  Administered 2024-02-17 – 2024-02-18 (×2): 4 g via ORAL
  Filled 2024-02-17 (×5): qty 1

## 2024-02-17 MED ORDER — CHOLESTYRAMINE LIGHT 4 G PO PACK: 4.0000 g | PACK | Freq: Three times a day (TID) | ORAL | Status: DC

## 2024-02-17 MED ORDER — NEPRO/CARBSTEADY PO LIQD: 237.0000 mL | ORAL | Status: DC | PRN

## 2024-02-17 MED ORDER — RISAQUAD PO CAPS
2.0000 | ORAL_CAPSULE | Freq: Three times a day (TID) | ORAL | Status: DC
  Administered 2024-02-17 – 2024-02-18 (×5): 2 via ORAL
  Filled 2024-02-17 (×5): qty 2

## 2024-02-17 MED ORDER — PENTAFLUOROPROP-TETRAFLUOROETH EX AERO: 1.0000 | INHALATION_SPRAY | CUTANEOUS | Status: DC | PRN

## 2024-02-17 MED ORDER — VANCOMYCIN HCL 125 MG PO CAPS
125.0000 mg | ORAL_CAPSULE | Freq: Four times a day (QID) | ORAL | Status: DC
  Administered 2024-02-17: 125 mg via ORAL

## 2024-02-17 MED ORDER — LIDOCAINE HCL (PF) 1 % IJ SOLN: 5.0000 mL | INTRAMUSCULAR | Status: DC | PRN

## 2024-02-17 MED ORDER — SODIUM ZIRCONIUM CYCLOSILICATE 10 G PO PACK
10.0000 g | PACK | Freq: Once | ORAL | Status: AC
  Administered 2024-02-17: 10 g via ORAL
  Filled 2024-02-17: qty 1

## 2024-02-17 NOTE — Hospital Course (Addendum)
 Amy Moses is a 53 y.o. f with medical history significant for DM II, ESRD on hemodialysis, chronic HFpEF, chronic lower extremity wounds, hypothyroidism, OSA on CPAP, and recurrent C. difficile colitis on vancomycin taper who presents with loose stools and missed dialysis.   Patient reports that she continues to have multiple loose stools per day.  She did not go to dialysis yesterday due to the loose stools.  She denies abdominal pain at this time but reports occasional fleeting pains.  She also denies nausea currently but states that she has had a couple episodes of nausea with vomiting.  She denies shortness of breath or chest pain.  She is concerned that she will miss dialysis again tomorrow due to her loose stools.   ED Course: Patient is found to be afebrile and saturating well on room air with normal heart rate and stable blood pressure.  Labs are most notable for potassium 5.4, bicarbonate 21, BUN 38, normal WBC, hemoglobin 10.8, and platelets 89,000.   GI (Dr. Marletta Lor) was consulted by the ED PA and recommended continued vancomycin taper and close outpatient follow-up.  Nephrology (Dr. Arrie Aran) was consulted with the ED PA and agreed to arrange for inpatient dialysis in the morning.   Patient was given a dose of Lokelma in the ED.     Assessment/Plan    ESRD; hyperkalemia  - K+ 5.4, 5.8  -Another dose of Lokelma 10 mg will be given today - Nephrology consulted overnight anticipating hemodialysis today  - SLIV, restrict fluids, renally-dose medications   H/o C difficile colitis-with persistent diarrhea -Stool study negative for C. difficile antigen, toxin, -Multiple episodes, 5 episode overnight -GI Dr. Marletta Lor was called overnight recommend to continue oral vancomycin -Patient has been on oral vancomycin taper every other day 125 mg p.o. -Due to multiple episodes of diarrhea increasing into 4 times daily for now -Adding lactobacillus -Since stool came back negative for C.  difficile this time, initiating as needed Imodium   Type II DM  - A1c was 9.0% in September 2024  -Diabetic diet, checking CBG q. ACHS, insulin coverage   Chronic hypotension  -BP stable - Continue midodrine     Hypothyroidism  -Will continue Synthroid    6. Chronic wounds  -bilateral lower extremities - Wound care -Unna boots in place   Thrombocytopenia  - Chronic, no overt bleeding     Chronic severe debility  Wheelchair-bound   OSA  - CPAP while sleeping

## 2024-02-17 NOTE — Progress Notes (Signed)
 Pt arrived to unit and moved from gurney to bed with max assist. Pt wc bound at home, A&O x 4, no c/o pain. Pt arrives with gauze wrapped to right foot and compression gauze wrapped around bilateral lower legs. Pt states the right heel has a wound and her home health nurse from [facility] changed the dressing eariler in the day before this admission to the hospital. Pt refuses to allow admitting RN to take down the dressing to assess wound.

## 2024-02-17 NOTE — Procedures (Signed)
 I was present at this dialysis session. I have reviewed the session itself and made appropriate changes.   Vital signs in last 24 hours:  Temp:  [97.7 F (36.5 C)-98.2 F (36.8 C)] 98 F (36.7 C) (03/29 1045) Pulse Rate:  [65-79] 79 (03/29 1045) Resp:  [13-21] 16 (03/29 1130) BP: (94-131)/(57-79) 122/79 (03/29 1130) SpO2:  [94 %-100 %] 99 % (03/29 1045) FiO2 (%):  [21 %] 21 % (03/28 2245) Weight:  [90.7 kg-91.3 kg] 91.3 kg (03/29 1045) Weight change:  Filed Weights   02/16/24 1231 02/17/24 1045  Weight: 90.7 kg 91.3 kg    Recent Labs  Lab 02/17/24 0438  NA 138  K 5.8*  CL 101  CO2 23  GLUCOSE 143*  BUN 43*  CREATININE 10.41*  CALCIUM 9.1    Recent Labs  Lab 02/16/24 1409 02/17/24 0438  WBC 6.5 6.8  NEUTROABS 4.7  --   HGB 10.9* 11.2*  HCT 35.1* 37.1  MCV 93.6 95.6  PLT 89* 74*    Scheduled Meds:  acidophilus  2 capsule Oral TID   atorvastatin  10 mg Oral QPM   Chlorhexidine Gluconate Cloth  6 each Topical Q0600   insulin aspart  0-5 Units Subcutaneous QHS   insulin aspart  0-6 Units Subcutaneous TID WC   levothyroxine  75 mcg Oral Q0600   midodrine  10 mg Oral TID WC   sodium chloride flush  3 mL Intravenous Q12H   vancomycin  125 mg Oral QID   Continuous Infusions: PRN Meds:.acetaminophen **OR** acetaminophen, feeding supplement (NEPRO CARB STEADY), lidocaine (PF), lidocaine-prilocaine, ondansetron **OR** ondansetron (ZOFRAN) IV, pentafluoroprop-tetrafluoroeth, simethicone   Irena Cords,  MD 02/17/2024, 11:50 AM

## 2024-02-17 NOTE — Progress Notes (Signed)
 PROGRESS NOTE    Patient: Amy Moses                            PCP: Lorelei Pont, DO                    DOB: 07/22/1971            DOA: 02/16/2024 ZOX:[id number]             DOS: 02/17/2024, 9:02 AM   LOS: 0 days   Date of Service: The patient was seen and examined on 02/17/2024  Subjective:   The patient was seen and examined this morning. Hemodynamically stable. Still reporting multiple episodes of watery diarrhea about 5 episodes overnight. Denies having any fever or chills..  Denies any nausea vomiting, tolerating p.o.  Brief Narrative:   Madge Therrien is a 53 y.o. f with medical history significant for DM II, ESRD on hemodialysis, chronic HFpEF, chronic lower extremity wounds, hypothyroidism, OSA on CPAP, and recurrent C. difficile colitis on vancomycin taper who presents with loose stools and missed dialysis.   Patient reports that she continues to have multiple loose stools per day.  She did not go to dialysis yesterday due to the loose stools.  She denies abdominal pain at this time but reports occasional fleeting pains.  She also denies nausea currently but states that she has had a couple episodes of nausea with vomiting.  She denies shortness of breath or chest pain.  She is concerned that she will miss dialysis again tomorrow due to her loose stools.   ED Course: Patient is found to be afebrile and saturating well on room air with normal heart rate and stable blood pressure.  Labs are most notable for potassium 5.4, bicarbonate 21, BUN 38, normal WBC, hemoglobin 10.8, and platelets 89,000.   GI (Dr. Marletta Lor) was consulted by the ED PA and recommended continued vancomycin taper and close outpatient follow-up.  Nephrology (Dr. Arrie Aran) was consulted with the ED PA and agreed to arrange for inpatient dialysis in the morning.   Patient was given a dose of Lokelma in the ED.     Assessment/Plan    ESRD; hyperkalemia  - K+ 5.4, 5.8  -Another dose of Lokelma 10 mg  will be given today - Nephrology consulted overnight anticipating hemodialysis today  - SLIV, restrict fluids, renally-dose medications   H/o C difficile colitis-with persistent diarrhea -Stool study negative for C. difficile antigen, toxin, -Multiple episodes, 5 episode overnight -GI Dr. Marletta Lor was called overnight recommend to continue oral vancomycin -Patient has been on oral vancomycin taper every other day 125 mg p.o. -Due to multiple episodes of diarrhea increasing into 4 times daily for now -Adding lactobacillus -Since stool came back negative for C. difficile this time, initiating as needed Imodium   Type II DM  - A1c was 9.0% in September 2024  -Diabetic diet, checking CBG q. ACHS, insulin coverage   Chronic hypotension  -BP stable - Continue midodrine     Hypothyroidism  -Will continue Synthroid    6. Chronic wounds  -bilateral lower extremities - Wound care -Unna boots in place   Thrombocytopenia  - Chronic, no overt bleeding     Chronic severe debility  Wheelchair-bound   OSA  - CPAP while sleeping       ---------------------------------------------------------------------------------------------------------------------------------------------- Nutritional status:  The patient's BMI is: Body mass index is 32.28 kg/m. I agree with the assessment and plan as  outlined ---------------------------------------------------------------------------------------------------------------------------------------------- Cultures; Stool study negative for C. Difficile Cultures pending  ------------------------------------------------------------------------------------------------------------------------------------------------  DVT prophylaxis:  SCDs Start: 02/16/24 1923   Code Status:   Code Status: Full Code  Family Communication: No family member present at bedside-  -Advance care planning has been discussed.   Admission status:   Status is:  Observation The patient remains OBS appropriate and will d/c before 2 midnights.   Disposition: From  - home             Planning for discharge in 1 days: to once stooling improved  Due to multiple episodes of diarrhea concern for dehydration on dialysis-patient is afraid not making dialysis sessions  Procedures:   No admission procedures for hospital encounter.   Antimicrobials:  Anti-infectives (From admission, onward)    Start     Dose/Rate Route Frequency Ordered Stop   02/17/24 1000  vancomycin (VANCOCIN) capsule 125 mg  Status:  Discontinued        125 mg Oral Every other day 02/16/24 1923 02/17/24 0837   02/17/24 1000  vancomycin (VANCOCIN) capsule 125 mg        125 mg Oral 4 times daily 02/17/24 0837 02/27/24 0959        Medication:   acidophilus  2 capsule Oral TID   atorvastatin  10 mg Oral QPM   Chlorhexidine Gluconate Cloth  6 each Topical Q0600   insulin aspart  0-5 Units Subcutaneous QHS   insulin aspart  0-6 Units Subcutaneous TID WC   levothyroxine  75 mcg Oral Q0600   midodrine  10 mg Oral TID WC   sodium chloride flush  3 mL Intravenous Q12H   sodium zirconium cyclosilicate  10 g Oral Once   vancomycin  125 mg Oral QID    acetaminophen **OR** acetaminophen, loperamide, ondansetron **OR** ondansetron (ZOFRAN) IV, simethicone   Objective:   Vitals:   02/16/24 1845 02/16/24 2100 02/17/24 0100 02/17/24 0505  BP: 98/61 103/64 105/68 111/65  Pulse: 70 78 75 73  Resp:  18 20   Temp:  97.8 F (36.6 C) 97.7 F (36.5 C) 98 F (36.7 C)  TempSrc:  Oral Oral Oral  SpO2: 95% 99% 98% 99%  Weight:      Height:       No intake or output data in the 24 hours ending 02/17/24 0902 Filed Weights   02/16/24 1231  Weight: 90.7 kg     Physical examination:   Constitution:  Alert, cooperative, no distress,  Appears calm and comfortable  Psychiatric:   Normal and stable mood and affect, cognition intact,   HEENT:        Normocephalic, PERRL, otherwise  with in Normal limits  Chest:         Chest symmetric Cardio vascular:  S1/S2, RRR, No murmure, No Rubs or Gallops  pulmonary: Clear to auscultation bilaterally, respirations unlabored, negative wheezes / crackles Abdomen: Soft, non-tender, non-distended, bowel sounds,no masses, no organomegaly Muscular skeletal: Severe global generalized weaknesses, wheelchair-bound  Limited exam - in bed, able to move all 4 extremities,   Neuro: CNII-XII intact. , normal motor and sensation, reflexes intact  Extremities: No pitting edema lower extremities, +2 pulses  Skin: Dry, warm to touch, negative for any Rashes, No open wounds Wounds: Chronic bilateral lower extremity wound with edema, Unna boots dressing in place   ------------------------------------------------------------------------------------------------------------------------------------------    LABs:     Latest Ref Rng & Units 02/17/2024    4:38 AM 02/16/2024    2:09 PM 01/18/2024  8:36 AM  CBC  WBC 4.0 - 10.5 K/uL 6.8  6.5  8.2   Hemoglobin 12.0 - 15.0 g/dL 56.2  13.0  86.5   Hematocrit 36.0 - 46.0 % 37.1  35.1  32.8   Platelets 150 - 400 K/uL 74  89  141       Latest Ref Rng & Units 02/17/2024    4:38 AM 02/16/2024    2:09 PM 01/18/2024    5:23 AM  CMP  Glucose 70 - 99 mg/dL 784  86  696   BUN 6 - 20 mg/dL 43  38  59   Creatinine 0.44 - 1.00 mg/dL 29.52  8.41  3.24   Sodium 135 - 145 mmol/L 138  137  130   Potassium 3.5 - 5.1 mmol/L 5.8  5.4  4.9   Chloride 98 - 111 mmol/L 101  101  94   CO2 22 - 32 mmol/L 23  21  22    Calcium 8.9 - 10.3 mg/dL 9.1  8.8  9.1   Total Protein 6.5 - 8.1 g/dL  7.1    Total Bilirubin 0.0 - 1.2 mg/dL  0.8    Alkaline Phos 38 - 126 U/L  95    AST 15 - 41 U/L  14    ALT 0 - 44 U/L  12         Micro Results Recent Results (from the past 240 hours)  C Difficile Quick Screen w PCR reflex     Status: None   Collection Time: 02/16/24  2:55 PM   Specimen: Stool  Result Value Ref Range Status    C Diff antigen NEGATIVE NEGATIVE Final   C Diff toxin NEGATIVE NEGATIVE Final   C Diff interpretation No C. difficile detected.  Final    Comment: Performed at [facility], [address] [place], [place] [address]    Radiology Reports CT ABDOMEN PELVIS WO CONTRAST Result Date: 02/16/2024 CLINICAL DATA:  Diarrhea.  Vomiting. EXAM: CT ABDOMEN AND PELVIS WITHOUT CONTRAST TECHNIQUE: Multidetector CT imaging of the abdomen and pelvis was performed following the standard protocol without IV contrast. RADIATION DOSE REDUCTION: This exam was performed according to the departmental dose-optimization program which includes automated exposure control, adjustment of the mA and/or kV according to patient size and/or use of iterative reconstruction technique. COMPARISON:  September 30, 2022. FINDINGS: Lower chest: No acute abnormality. Hepatobiliary: No focal liver abnormality is seen. Status post cholecystectomy. No biliary dilatation. Pancreas: Unremarkable. No pancreatic ductal dilatation or surrounding inflammatory changes. Spleen: Normal in size without focal abnormality. Adrenals/Urinary Tract: Adrenal glands appear normal. Bilateral renal atrophy is noted consistent with history of end-stage renal disease. No hydronephrosis or renal obstruction is noted. Urinary bladder is decompressed. Stomach/Bowel: Stomach is within normal limits. Appendix appears normal. There is no evidence of bowel obstruction. There is noted moderate wall thickening involving proximal small bowel loops which was present on prior exam, but potentially may represent enteritis. Vascular/Lymphatic: Aortic atherosclerosis. No enlarged abdominal or pelvic lymph nodes. Reproductive: Calcified uterine fibroid is noted. No definite adnexal abnormality is noted. Other: No ascites or hernia is noted. Musculoskeletal: No acute or significant osseous findings. IMPRESSION: Moderate wall thickening is seen involving proximal small bowel loops which was  present on prior exam and potentially may represent enteritis or other inflammation. There is no definite evidence of bowel obstruction. Calcified uterine fibroid is again noted. Aortic Atherosclerosis (ICD10-I70.0). Electronically Signed   By: Lupita Raider M.D.   On: 02/16/2024  16:04    SIGNED: Kendell Bane, MD, FHM. FAAFP. [facility] - [facility] Time spent - 55 min.  In seeing, evaluating and examining the patient. Reviewing medical records, labs, drawn plan of care. [facility],  Pager (please use [email or web address] to page/ text) Please use Epic Secure Chat for non-urgent communication (7AM-7PM)  If 7PM-7AM, please contact night-coverage [email or web address], 02/17/2024, 9:02 AM

## 2024-02-17 NOTE — Progress Notes (Signed)
   HEMODIALYSIS TREATMENT NOTE:  Uneventful 3.5 hour heparin-free treatment completed using left upper arm AVF (15g/antegrade). Goal met: 1 liter removed. No interruption in UF.  All blood was returned.  Hemostasis was achieved in 15 minutes.  Post-HD:  02/17/24 1505  Vital Signs  Temp 98 F (36.7 C)  Temp Source Oral  Pulse Rate 75  Pulse Rate Source Monitor  Resp 18  BP 131/71  BP Location Right Arm  BP Method Automatic  Patient Position (if appropriate) Lying  Oxygen Therapy  SpO2 100 %  O2 Device Room Air  Pain Assessment  Pain Score 0  Dialysis Weight  Weight 90.2 kg  Type of Weight Post-Dialysis  Post Treatment  Dialyzer Clearance Lightly streaked  Hemodialysis Intake (mL) 0 mL  Liters Processed 81.2  Fluid Removed (mL) 1000 mL  Tolerated HD Treatment Yes  Post-Hemodialysis Comments Goal met  AVG/AVF Arterial Site Held (minutes) 7 minutes  AVG/AVF Venous Site Held (minutes) 7 minutes  Fistula / Graft Left Upper arm Arteriovenous fistula  No placement date or time found.   Placed prior to admission: Yes  Orientation: Left  Access Location: Upper arm  Access Type: Arteriovenous fistula  Fistula / Graft Assessment Thrill;Bruit  Status Patent    Arman Filter, RN AP KDU

## 2024-02-18 DIAGNOSIS — E039 Hypothyroidism, unspecified: Secondary | ICD-10-CM | POA: Diagnosis not present

## 2024-02-18 DIAGNOSIS — A0472 Enterocolitis due to Clostridium difficile, not specified as recurrent: Secondary | ICD-10-CM | POA: Diagnosis not present

## 2024-02-18 DIAGNOSIS — E1122 Type 2 diabetes mellitus with diabetic chronic kidney disease: Secondary | ICD-10-CM | POA: Diagnosis not present

## 2024-02-18 DIAGNOSIS — N186 End stage renal disease: Secondary | ICD-10-CM | POA: Diagnosis not present

## 2024-02-18 LAB — GLUCOSE, CAPILLARY
Glucose-Capillary: 133 mg/dL — ABNORMAL HIGH (ref 70–99)
Glucose-Capillary: 159 mg/dL — ABNORMAL HIGH (ref 70–99)
Glucose-Capillary: 110 mg/dL — ABNORMAL HIGH (ref 70–99)

## 2024-02-18 LAB — BASIC METABOLIC PANEL WITH GFR
Anion gap: 14 (ref 5–15)
BUN: 31 mg/dL — ABNORMAL HIGH (ref 6–20)
CO2: 23 mmol/L (ref 22–32)
Calcium: 8.7 mg/dL — ABNORMAL LOW (ref 8.9–10.3)
Chloride: 99 mmol/L (ref 98–111)
Creatinine, Ser: 7.1 mg/dL — ABNORMAL HIGH (ref 0.44–1.00)
GFR, Estimated: 7 mL/min — ABNORMAL LOW (ref >60)
Glucose, Bld: 124 mg/dL — ABNORMAL HIGH (ref 70–99)
Potassium: 4.5 mmol/L (ref 3.5–5.1)
Sodium: 136 mmol/L (ref 135–145)

## 2024-02-18 LAB — CBC
HCT: 34.8 % — ABNORMAL LOW (ref 36.0–46.0)
Hemoglobin: 10.6 g/dL — ABNORMAL LOW (ref 12.0–15.0)
MCH: 28.5 pg (ref 26.0–34.0)
MCHC: 30.5 g/dL (ref 30.0–36.0)
MCV: 93.5 fL (ref 80.0–100.0)
Platelets: 69 10*3/uL — ABNORMAL LOW (ref 150–400)
RBC: 3.72 MIL/uL — ABNORMAL LOW (ref 3.87–5.11)
RDW: 14.4 % (ref 11.5–15.5)
WBC: 6.5 10*3/uL (ref 4.0–10.5)
nRBC: 0 % (ref 0.0–0.2)

## 2024-02-18 LAB — HEPATITIS B SURFACE ANTIBODY, QUANTITATIVE: Hep B S AB Quant (Post): <3.5 m[IU]/mL — ABNORMAL LOW

## 2024-02-18 MED ORDER — RISAQUAD PO CAPS: 2.0000 | ORAL_CAPSULE | Freq: Three times a day (TID) | ORAL | 0 refills | Status: DC

## 2024-02-18 MED ORDER — FAMOTIDINE 20 MG PO TABS: 20.0000 mg | ORAL_TABLET | Freq: Every day | ORAL | 1 refills | Status: DC

## 2024-02-18 MED ORDER — CHOLESTYRAMINE 4 G PO PACK: 4.0000 g | PACK | Freq: Two times a day (BID) | ORAL | 2 refills | Status: DC

## 2024-02-18 MED ORDER — DIPHENOXYLATE-ATROPINE 2.5-0.025 MG PO TABS: 2.0000 | ORAL_TABLET | Freq: Three times a day (TID) | ORAL | 0 refills | Status: DC | PRN

## 2024-02-18 NOTE — Discharge Summary (Signed)
 Physician Discharge Summary   Patient: Amy Moses MRN: [id number] DOB: Jan 22, 1971  Admit date:     02/16/2024  Discharge date: 02/18/24  Discharge Physician: Vassie Loll   PCP: Lorelei Pont, DO   Recommendations at discharge:  Repeat CBC to follow hemoglobin and platelet count trend/stability Reassess blood pressure and further adjust antihypertensive regimen as required Make sure patient follow-up with gastroenterology service as instructed. Continue to follow CBGs fluctuation/A1c and further adjust hypoglycemic regimen as required.  Discharge Diagnoses: Principal Problem:   ESRD needing dialysis [facility]) Active Problems:   Type 2 diabetes mellitus with ESRD (end-stage renal disease) (HCC)   Acquired hypothyroidism   OSA (obstructive sleep apnea)   Thrombocytopenia (HCC)   Clostridioides difficile diarrhea   Skin ulcer of right heel, limited to breakdown of skin (HCC) Chronic diastolic heart failure  Brief Hospital admission narrative: As per H&P written by Dr. Antionette Char on 02/16/2024 Amy Moses is a 53 y.o. female with medical history significant for type 2 diabetes mellitus, ESRD on hemodialysis, chronic HFpEF, chronic lower extremity wounds, hypothyroidism, OSA on CPAP, and recurrent C. difficile colitis on vancomycin taper who presents with loose stools and missed dialysis.   Patient reports that she continues to have multiple loose stools per day.  She did not go to dialysis yesterday due to the loose stools.  She denies abdominal pain at this time but reports occasional fleeting pains.  She also denies nausea currently but states that she has had a couple episodes of nausea with vomiting.  She denies shortness of breath or chest pain.  She is concerned that she will miss dialysis again tomorrow due to her loose stools.   ED Course: Upon arrival to the ED, patient is found to be afebrile and saturating well on room air with normal heart rate and stable blood pressure.  Labs  are most notable for potassium 5.4, bicarbonate 21, BUN 38, normal WBC, hemoglobin 10.8, and platelets 89,000.   GI (Dr. Marletta Lor) was consulted by the ED PA and recommended continued vancomycin taper and close outpatient follow-up.  Nephrology (Dr. Arrie Aran) was consulted with the ED PA and agreed to arrange for inpatient dialysis in the morning.  Patient was given a dose of Lokelma in the ED.  Assessment and Plan: 1-history of C. difficile colitis with persistent diarrhea -Repeat C. difficile and GI pathogen panel studies negative throughout this hospitalization -Case discussed with gastroenterology service who recommended completing vancomycin tapering as previously instructed and to add the use of cholestyramine twice a day to assist with functional diarrhea. -Continue avoiding the use of PPI; instead Pepcid has been added to assist with GERD symptoms control. -Patient advised to maintain adequate hydration and to continue the use of prescribed probiotics and outpatient follow-up with GI service.  2-ESRD/hyperkalemia -Patient received Lokelma and dialysis treatment -Electrolytes and volume now stable -Continue outpatient dialysis as per previous schedule.  3-anemia of chronic disease/thrombocytopenia -No upper bleeding appreciated -IV iron and Epogen therapy as per nephrology discretion -Continue to follow CBC intermittently to assess stability.  4-obstructive sleep apnea -Continue CPAP nightly.  5-chronic severe debility -Patient is wheelchair-bound -Continue supportive care.  6-chronic wounds (bilateral lower extremity) -Wound care as an outpatient to be resumed -Unna boots in place.  7-type 2 diabetes with nephropathy -Resume home hypoglycemic regimen -Modified carbohydrate diet discussed with patient -Continue to follow CBG fluctuation and further adjust hypoglycemic regimen as needed.  8-chronic diastolic heart failure -Continue to check weight on daily basis -Volume  management with dialysis  treatment.  9-hypothyroidism -Continue Synthroid.  10-chronic hypotension -Continue the use of midodrine.  Consultants: Nephrology service and GI service (Dr. Marletta Lor curbside). Procedures performed: See below for x-ray reports. Disposition: Home Diet recommendation: Heart healthy/low-sodium and modified carbohydrate diet.  DISCHARGE MEDICATION: Allergies as of 02/18/2024       Reactions   Ace Inhibitors Cough        Medication List     STOP taking these medications    fluconazole 150 MG tablet Commonly known as: DIFLUCAN   pantoprazole 40 MG tablet Commonly known as: Protonix       TAKE these medications    acidophilus Caps capsule Take 2 capsules by mouth 3 (three) times daily.   atorvastatin 10 MG tablet Commonly known as: LIPITOR Take 10 mg by mouth every evening.   Auryxia 1 GM 210 MG(Fe) tablet Generic drug: ferric citrate Take 630 mg by mouth 3 (three) times daily with meals.   cholestyramine 4 g packet Commonly known as: QUESTRAN Take 1 packet (4 g total) by mouth 2 (two) times daily. Mix with 4-6 oz liquid.  Take other meds 1 hr before or 4-6 hr after cholestyramine.   diphenoxylate-atropine 2.5-0.025 MG tablet Commonly known as: LOMOTIL Take 2 tablets by mouth every 8 (eight) hours as needed for diarrhea or loose stools. What changed: when to take this   famotidine 20 MG tablet Commonly known as: Pepcid Take 1 tablet (20 mg total) by mouth daily.   HumaLOG KwikPen 100 UNIT/ML KwikPen Generic drug: insulin lispro Inject 5-11 Units into the skin 3 (three) times daily with meals. If eats 50% or more of meal. What changed: additional instructions   insulin glargine 100 UNIT/ML injection Commonly known as: LANTUS Inject 0.08 mLs (8 Units total) into the skin at bedtime.   levothyroxine 75 MCG tablet Commonly known as: SYNTHROID Take 75 mcg by mouth daily.   midodrine 10 MG tablet Commonly known as:  PROAMATINE Take 1 tablet (10 mg total) by mouth 3 (three) times daily.   ondansetron 4 MG disintegrating tablet Commonly known as: ZOFRAN-ODT Take 1 tablet (4 mg total) by mouth daily as needed for nausea or vomiting. Only use for severe nausea/vomiting while on Dificid.   vancomycin 125 MG capsule Commonly known as: Vancocin Take 1 capsule (125mg ) by mouth 3 times daily AND at BEDTIME-until 3/10, then 1 capsule (125 mg) 3 times daily for 7 days, then 1 capsule (125mg ) once daily for 7 days, then 1 capsule (125mg ) every OTHER day for 28 days, then 1 capsule (125mg ) EVERY 3 DAYS for 28 days. What changed:  how much to take how to take this when to take this   Vitamin D (Ergocalciferol) 1.25 MG (50000 UNIT) Caps capsule Commonly known as: DRISDOL Take 50,000 Units by mouth every 7 (seven) days. Friday        Follow-up Information     Lanelle Bal, DO. Schedule an appointment as soon as possible for a visit in 1 week(s).   Specialty: Gastroenterology Contact information: [address] [place] [address] [phone]         Lorelei Pont, DO. Schedule an appointment as soon as possible for a visit in 10 day(s).   Specialty: Family Medicine Contact information: [address] [place] [address] [phone]                Discharge Exam: Filed Weights   02/16/24 1231 02/17/24 1045 02/17/24 1505  Weight: 90.7 kg 91.3 kg 90.2 kg  General exam: Alert, awake, oriented x 3; afebrile, in no acute distress. Respiratory system: Clear to auscultation. Respiratory effort normal.  Good saturation on room air. Cardiovascular system:RRR. No rubs or gallops. Gastrointestinal system: Abdomen is nondistended, soft and nontender. No organomegaly or masses felt. Normal bowel sounds heard. Central nervous system:No focal neurological deficits. Extremities: No cyanosis or clubbing. Skin: No petechiae; chronic lower extremity wounds with Unna boots in place.  No active  drainage appreciated. Psychiatry: Judgement and insight appear normal. Mood & affect appropriate.    Condition at discharge: Stable and improved.  The results of significant diagnostics from this hospitalization (including imaging, microbiology, ancillary and laboratory) are listed below for reference.   Imaging Studies: CT ABDOMEN PELVIS WO CONTRAST Result Date: 02/16/2024 CLINICAL DATA:  Diarrhea.  Vomiting. EXAM: CT ABDOMEN AND PELVIS WITHOUT CONTRAST TECHNIQUE: Multidetector CT imaging of the abdomen and pelvis was performed following the standard protocol without IV contrast. RADIATION DOSE REDUCTION: This exam was performed according to the departmental dose-optimization program which includes automated exposure control, adjustment of the mA and/or kV according to patient size and/or use of iterative reconstruction technique. COMPARISON:  September 30, 2022. FINDINGS: Lower chest: No acute abnormality. Hepatobiliary: No focal liver abnormality is seen. Status post cholecystectomy. No biliary dilatation. Pancreas: Unremarkable. No pancreatic ductal dilatation or surrounding inflammatory changes. Spleen: Normal in size without focal abnormality. Adrenals/Urinary Tract: Adrenal glands appear normal. Bilateral renal atrophy is noted consistent with history of end-stage renal disease. No hydronephrosis or renal obstruction is noted. Urinary bladder is decompressed. Stomach/Bowel: Stomach is within normal limits. Appendix appears normal. There is no evidence of bowel obstruction. There is noted moderate wall thickening involving proximal small bowel loops which was present on prior exam, but potentially may represent enteritis. Vascular/Lymphatic: Aortic atherosclerosis. No enlarged abdominal or pelvic lymph nodes. Reproductive: Calcified uterine fibroid is noted. No definite adnexal abnormality is noted. Other: No ascites or hernia is noted. Musculoskeletal: No acute or significant osseous findings.  IMPRESSION: Moderate wall thickening is seen involving proximal small bowel loops which was present on prior exam and potentially may represent enteritis or other inflammation. There is no definite evidence of bowel obstruction. Calcified uterine fibroid is again noted. Aortic Atherosclerosis (ICD10-I70.0). Electronically Signed   By: Lupita Raider M.D.   On: 02/16/2024 16:04    Microbiology: Results for orders placed or performed during the hospital encounter of 02/16/24  C Difficile Quick Screen w PCR reflex     Status: None   Collection Time: 02/16/24  2:55 PM   Specimen: Stool  Result Value Ref Range Status   C Diff antigen NEGATIVE NEGATIVE Final   C Diff toxin NEGATIVE NEGATIVE Final   C Diff interpretation No C. difficile detected.  Final    Comment: Performed at [facility], [address] [place], [place] [address]  Gastrointestinal Panel by PCR , Stool     Status: None   Collection Time: 02/16/24  3:00 PM   Specimen: Stool  Result Value Ref Range Status   Campylobacter species NOT DETECTED NOT DETECTED Final   Plesimonas shigelloides NOT DETECTED NOT DETECTED Final   Salmonella species NOT DETECTED NOT DETECTED Final   Yersinia enterocolitica NOT DETECTED NOT DETECTED Final   Vibrio species NOT DETECTED NOT DETECTED Final   Vibrio cholerae NOT DETECTED NOT DETECTED Final   Enteroaggregative E coli (EAEC) NOT DETECTED NOT DETECTED Final   Enteropathogenic E coli (EPEC) NOT DETECTED NOT DETECTED Final   Enterotoxigenic E coli (ETEC) NOT  DETECTED NOT DETECTED Final   Shiga like toxin producing E coli (STEC) NOT DETECTED NOT DETECTED Final   Shigella/Enteroinvasive E coli (EIEC) NOT DETECTED NOT DETECTED Final   Cryptosporidium NOT DETECTED NOT DETECTED Final   Cyclospora cayetanensis NOT DETECTED NOT DETECTED Final   Entamoeba histolytica NOT DETECTED NOT DETECTED Final   Giardia lamblia NOT DETECTED NOT DETECTED Final   Adenovirus F40/41 NOT DETECTED NOT DETECTED Final    Astrovirus NOT DETECTED NOT DETECTED Final   Norovirus GI/GII NOT DETECTED NOT DETECTED Final   Rotavirus A NOT DETECTED NOT DETECTED Final   Sapovirus (I, II, IV, and V) NOT DETECTED NOT DETECTED Final    Comment: Performed at [name], [address] [place], [place] [address]    Labs: CBC: Recent Labs  Lab 02/16/24 1409 02/17/24 0438 02/18/24 0416  WBC 6.5 6.8 6.5  NEUTROABS 4.7  --   --   HGB 10.9* 11.2* 10.6*  HCT 35.1* 37.1 34.8*  MCV 93.6 95.6 93.5  PLT 89* 74* 69*   Basic Metabolic Panel: Recent Labs  Lab 02/16/24 1409 02/17/24 0438 02/18/24 0416  NA 137 138 136  K 5.4* 5.8* 4.5  CL 101 101 99  CO2 21* 23 23  GLUCOSE 86 143* 124*  BUN 38* 43* 31*  CREATININE 9.78* 10.41* 7.10*  CALCIUM 8.8* 9.1 8.7*   Liver Function Tests: Recent Labs  Lab 02/16/24 1409  AST 14*  ALT 12  ALKPHOS 95  BILITOT 0.8  PROT 7.1  ALBUMIN 4.0   CBG: Recent Labs  Lab 02/17/24 0752 02/17/24 1554 02/17/24 2022 02/18/24 0804  GLUCAP 116* 86 123* 133*    Discharge time spent: greater than 30 minutes.  Signed: Vassie Loll, MD [facility] 02/18/2024

## 2024-02-19 ENCOUNTER — Encounter: Payer: Self-pay | Admitting: Orthopedic Surgery

## 2024-02-19 ENCOUNTER — Ambulatory Visit (INDEPENDENT_AMBULATORY_CARE_PROVIDER_SITE_OTHER): Admitting: Orthopedic Surgery

## 2024-02-19 DIAGNOSIS — L89612 Pressure ulcer of right heel, stage 2: Secondary | ICD-10-CM | POA: Diagnosis not present

## 2024-02-19 DIAGNOSIS — L97923 Non-pressure chronic ulcer of unspecified part of left lower leg with necrosis of muscle: Secondary | ICD-10-CM

## 2024-02-19 DIAGNOSIS — L97911 Non-pressure chronic ulcer of unspecified part of right lower leg limited to breakdown of skin: Secondary | ICD-10-CM

## 2024-02-19 DIAGNOSIS — I739 Peripheral vascular disease, unspecified: Secondary | ICD-10-CM

## 2024-02-19 LAB — GLUCOSE, CAPILLARY: Glucose-Capillary: 161 mg/dL — ABNORMAL HIGH (ref 70–99)
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
## 2024-02-21 ENCOUNTER — Encounter: Payer: Self-pay | Admitting: Gastroenterology

## 2024-02-21 ENCOUNTER — Ambulatory Visit: Admitting: Gastroenterology

## 2024-02-21 VITALS — BP 127/84 | HR 72 | Temp 97.7°F | Ht 66.0 in

## 2024-02-21 DIAGNOSIS — A498 Other bacterial infections of unspecified site: Secondary | ICD-10-CM | POA: Diagnosis not present

## 2024-02-21 MED ORDER — HYOSCYAMINE SULFATE 0.125 MG SL SUBL: 0.1250 mg | SUBLINGUAL_TABLET | SUBLINGUAL | 5 refills | Status: DC | PRN

## 2024-02-21 MED ORDER — ONDANSETRON 4 MG PO TBDP
4.0000 mg | ORAL_TABLET | Freq: Three times a day (TID) | ORAL | 3 refills | Status: DC | PRN
Start: 2024-02-21 — End: 2024-03-04
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
## 2024-02-21 NOTE — Patient Instructions (Addendum)
 Continue the vancomycin one every other day through April with last dose on April 26th. We are planning on you taking Vowst (a good bacteria that protects you from recurrent Cdiff) around 4/28. We are submitting for that and will make sure that the instructions are very clear!  I would stop Questran. Only take Lomotil sparingly.   Please message me with how you are doing!   We will see you in 4 weeks!  I enjoyed seeing you again today! I value our relationship and want to provide genuine, compassionate, and quality care. You may receive a survey regarding your visit with me, and I welcome your feedback! Thanks so much for taking the time to complete this. I look forward to seeing you again.      Gelene Mink, PhD, ANP-BC [facility] Gastroenterology

## 2024-02-26 ENCOUNTER — Telehealth: Payer: Self-pay | Admitting: Gastroenterology

## 2024-02-26 MED ORDER — PEG 3350-KCL-NA BICARB-NACL 420 G PO SOLR: 250.0000 mL | Freq: Once | ORAL | 0 refills | Status: AC

## 2024-02-26 NOTE — Telephone Encounter (Signed)
 Vowst paper work completed.   Will plan to start on 4/28. Last dose of abx is 4/26. She will take 250 milliliters of golytely on 4/27 in the afternoon to "flush" out. Nothing to eat or drink after midnight on 4/27. On morning of 4/28, she will take 4 capsules of Vowst ON EMPTY STOMACH prior to first meal of the day. She will do this 3 days in a row (4 capsules prior to eating).   Thanks!!  I sent the golytely to the pharmacy. Please let her know just 250 milliliters needed on 4/27 in afternoon.   We can go over instructions again once vowst approved.

## 2024-02-27 ENCOUNTER — Ambulatory Visit: Admitting: Orthopedic Surgery

## 2024-02-27 ENCOUNTER — Encounter: Payer: Self-pay | Admitting: Orthopedic Surgery

## 2024-02-27 DIAGNOSIS — L89612 Pressure ulcer of right heel, stage 2: Secondary | ICD-10-CM

## 2024-02-27 DIAGNOSIS — I739 Peripheral vascular disease, unspecified: Secondary | ICD-10-CM | POA: Diagnosis not present

## 2024-02-27 DIAGNOSIS — L97911 Non-pressure chronic ulcer of unspecified part of right lower leg limited to breakdown of skin: Secondary | ICD-10-CM

## 2024-02-27 MED ORDER — DOXYCYCLINE HYCLATE 100 MG PO TABS: 100.0000 mg | ORAL_TABLET | Freq: Two times a day (BID) | ORAL | 0 refills | Status: DC

## 2024-02-27 NOTE — Telephone Encounter (Signed)
 Pt was made aware and verbalized understanding.

## 2024-02-27 NOTE — Telephone Encounter (Signed)
 Faxed forms to VOWST. They replied needs chart notes, start and end date of antibiotic. Your chart notes are note complete.

## 2024-02-27 NOTE — Telephone Encounter (Signed)
 Thanks! Note is complete now.

## 2024-02-27 NOTE — Progress Notes (Signed)
 Office Visit Note   Patient: Amy Moses           Date of Birth: 16-Jan-1971           MRN: [id number] Visit Date: 02/27/2024              Requested by: Lorelei Pont, DO [address] [place],  [place] [address] PCP: Lorelei Pont, DO  Chief Complaint  Patient presents with   Left Leg - Follow-up    [facility] called with concerns for increased drainage to the right foot. Asked for updated orders to be sent to [phone]   Right Leg - Follow-up      HPI: Patient is seen in follow-up for the right lower extremity.  Patient states she had had some increased redness and swelling.  Denies any increased drainage denies any fever or chills denies any increased pain.  Assessment & Plan: Visit Diagnoses:  1. Calciphylaxis of right lower extremity with nonhealing ulcer, limited to breakdown of skin (HCC)   2. PVD (peripheral vascular disease) (HCC)   3. Pressure injury of right heel, stage 2 (HCC)     Plan: Will apply silver alginate to the wounds Dynaflex to the leg.  Prescription called in for doxycycline.  If patient develops any systemic symptoms she will follow-up at [facility] for admission and IV antibiotics otherwise follow-up in 2 weeks.  Continue with home health dressing changes.  Follow-Up Instructions: Return in about 2 weeks (around 03/12/2024).   Ortho Exam  Patient is alert, oriented, no adenopathy, well-dressed, normal affect, normal respiratory effort. Examination patient does have some increased redness and swelling.  The calf is nontender to palpation.  The venous ulcers show improvement from the last exam with improved epithelialization.  She has a stable ulcer on the right heel which is flat with healthy granulation tissue 2 cm in diameter.  Imaging: No results found. No images are attached to the encounter.  Labs: Lab Results  Component Value Date   HGBA1C 5.6 02/17/2024   HGBA1C 9.0 (H) 08/02/2023   HGBA1C 9.4 (H) 10/01/2022   ESRSEDRATE 7 08/03/2023    ESRSEDRATE 34 (H) 02/14/2022   ESRSEDRATE 40 (H) 08/07/2021   CRP 1.7 (H) 08/03/2023   CRP 7.0 (H) 02/14/2022   CRP 11.1 (H) 08/07/2021   REPTSTATUS 01/18/2024 FINAL 01/13/2024   GRAMSTAIN  07/08/2021    RARE WBC PRESENT,BOTH PMN AND MONONUCLEAR RARE GRAM POSITIVE RODS RARE GRAM POSITIVE COCCI IN PAIRS    CULT  01/13/2024    NO GROWTH 5 DAYS Performed at [facility], [address], [place], [place] [address]    LABORGA CITROBACTER KOSERI 04/28/2022     Lab Results  Component Value Date   ALBUMIN 4.0 02/16/2024   ALBUMIN 3.1 (L) 01/18/2024   ALBUMIN 3.2 (L) 01/16/2024   PREALBUMIN 22 08/03/2023   PREALBUMIN 23 08/02/2023   PREALBUMIN 13.7 (L) 08/07/2021    Lab Results  Component Value Date   MG 2.2 08/03/2023   MG 1.9 03/24/2023   MG 2.4 10/01/2022   Lab Results  Component Value Date   VD25OH 36.9 12/30/2021    Lab Results  Component Value Date   PREALBUMIN 22 08/03/2023   PREALBUMIN 23 08/02/2023   PREALBUMIN 13.7 (L) 08/07/2021      Latest Ref Rng & Units 02/18/2024    4:16 AM 02/17/2024    4:38 AM 02/16/2024    2:09 PM  CBC EXTENDED  WBC 4.0 - 10.5 K/uL 6.5  6.8  6.5   RBC 3.87 - 5.11 MIL/uL 3.72  3.88  3.75   Hemoglobin 12.0 - 15.0 g/dL 53.6  64.4  03.4   HCT 36.0 - 46.0 % 34.8  37.1  35.1   Platelets 150 - 400 K/uL 69  74  89   NEUT# 1.7 - 7.7 K/uL   4.7   Lymph# 0.7 - 4.0 K/uL   0.8      There is no height or weight on file to calculate BMI.  Orders:  No orders of the defined types were placed in this encounter.  Meds ordered this encounter  Medications   doxycycline (VIBRA-TABS) 100 MG tablet    Sig: Take 1 tablet (100 mg total) by mouth 2 (two) times daily.    Dispense:  30 tablet    Refill:  0     Procedures: No procedures performed  Clinical Data: No additional findings.  ROS:  All other systems negative, except as noted in the HPI. Review of Systems  Objective: Vital Signs: LMP 08/22/2015 (Approximate)   Specialty  Comments:  No specialty comments available.  PMFS History: Patient Active Problem List   Diagnosis Date Noted   Clostridium difficile infection 02/21/2024   ESRD needing dialysis (HCC) 02/16/2024   Skin ulcer of right heel, limited to breakdown of skin (HCC) 01/15/2024   Ulcerated, foot (HCC) 01/13/2024   Shoulder pain 01/13/2024   Hypotension 01/13/2024   Anemia due to chronic kidney disease 11/01/2023   Clostridioides difficile diarrhea 10/30/2023   Rash 10/30/2023   Diarrhea 10/24/2023   Non-pressure chronic ulcer of other part of right foot limited to breakdown of skin (HCC) 08/08/2023   PAD (peripheral artery disease) (HCC) 08/08/2023   Acute osteomyelitis of right foot (HCC) 08/04/2023   OSA (obstructive sleep apnea) 08/02/2023   Prolapsed internal hemorrhoids, grade 3 03/29/2023   Thrombocytopenia (HCC) 03/21/2023   Mitral regurgitation 11/16/2022   Pressure injury of skin 10/01/2022   Pulmonary hypertension, unspecified (HCC) 09/12/2022   GERD (gastroesophageal reflux disease) 04/30/2022   Non-adherence to medical treatment 03/16/2022   Cutaneous abscess of right foot    Osteomyelitis of foot (HCC) 02/14/2022   Obesity (BMI 30-39.9) 01/26/2022   Ischemic ulcer of right foot (HCC) 01/24/2022   Anemia of chronic kidney failure, stage 5 (HCC) 01/24/2022   Sacral pressure ulcer 01/24/2022   Leg wound, left, sequela 12/10/2021   Open leg wound 09/03/2021   Wound infection    Non-pressure chronic ulcer of right calf limited to breakdown of skin (HCC)    Calciphylaxis of right lower extremity with nonhealing ulcer, limited to breakdown of skin (HCC)    Chronic ulcer of left thigh (HCC) 08/07/2021   Calciphylaxis of left lower extremity with nonhealing ulcer with necrosis of muscle (HCC) 08/07/2021   Chronic diastolic heart failure (HCC) 03/30/2021   ESRD (end stage renal disease) on dialysis (HCC) 11/16/2020   Calciphylaxis 11/06/2020   Non-healing open wound of heel  11/03/2020   Diabetic foot infection (HCC) 11/01/2020   Decubitus ulcer, heel 11/01/2020   Closed nondisplaced fracture of left patella 10/29/2020   Acute on chronic renal failure (HCC) 06/10/2020   Anemia of chronic disease 06/10/2020   Vitamin D deficiency 01/28/2019   Type 2 diabetes mellitus with ESRD (end-stage renal disease) (HCC) 09/21/2015   Mixed hyperlipidemia 09/21/2015   Primary hypertension 09/21/2015   Acquired hypothyroidism 09/21/2015   Iris bomb 07/31/2012   Secondary angle-closure glaucoma 07/31/2012   Past Medical History:  Diagnosis Date  Anemia    Blindness of right eye with low vision in contralateral eye    s/p victrectomy   Chronic diastolic heart failure (HCC) 03/30/2021   Diabetes mellitus, type II (HCC)    Dyslipidemia    ESRD on hemodialysis (HCC)    TTS in [place] , [place]   Glaucoma    History of blood transfusion    Hypertension    Hypothyroidism (acquired)    Mitral regurgitation 11/16/2022   Pneumonia    Pulmonary hypertension, unspecified (HCC) 09/12/2022    Family History  Problem Relation Age of Onset   Heart failure Mother    Heart disease Mother    Diabetes Mother    Kidney disease Mother    Heart failure Father    Diabetes Father    Heart disease Father    Diabetes Brother    Heart failure Maternal Grandmother    Heart failure Maternal Grandfather    Transient ischemic attack Maternal Grandfather    Colon cancer Neg Hx     Past Surgical History:  Procedure Laterality Date   ABDOMINAL AORTOGRAM W/LOWER EXTREMITY Bilateral 12/18/2020   Procedure: ABDOMINAL AORTOGRAM W/LOWER EXTREMITY;  Surgeon: Sherren Kerns, MD;  Location: [facility];  Service: Cardiovascular;  Laterality: Bilateral;   ABDOMINAL AORTOGRAM W/LOWER EXTREMITY Bilateral 01/25/2022   Procedure: ABDOMINAL AORTOGRAM W/LOWER EXTREMITY;  Surgeon: Nada Libman, MD;  Location: [facility];  Service: Cardiovascular;  Laterality: Bilateral;    ABDOMINAL AORTOGRAM W/LOWER EXTREMITY Right 08/04/2023   Procedure: ABDOMINAL AORTOGRAM W/LOWER EXTREMITY;  Surgeon: Leonie Douglas, MD;  Location: [facility];  Service: Cardiovascular;  Laterality: Right;   AMPUTATION Right 02/16/2022   Procedure: RIGHT FOOT 5TH RAY AMPUTATION;  Surgeon: Nadara Mustard, MD;  Location: [facility] OR;  Service: Orthopedics;  Laterality: Right;   ANKLE FRACTURE SURGERY Right    AV FISTULA PLACEMENT Left 08/18/2020   Procedure: LEFT ARM BRACHIOCEPHALIC ARTERIOVENOUS (AV) FISTULA CREATION;  Surgeon: Sherren Kerns, MD;  Location: [name] OR;  Service: Vascular;  Laterality: Left;   BIOPSY  04/24/2021   Procedure: BIOPSY;  Surgeon: Lanelle Bal, DO;  Location: [facility];  Service: Endoscopy;;   CESAREAN SECTION     CHOLECYSTECTOMY     COLONOSCOPY  04/24/2021   Surgeon: Lanelle Bal, DO;  nonbleeding internal hemorrhoids, 1 large (25 mm) pedunculated transverse colon polyp (prolapse type polyp) with adherent clot and stigmata of recent bleed.   COLONOSCOPY WITH PROPOFOL N/A 05/14/2021   Procedure: COLONOSCOPY WITH PROPOFOL;  Surgeon: Corbin Ade, MD;  Location: [facility];  Service: Endoscopy;  Laterality: N/A;   COLONOSCOPY WITH PROPOFOL N/A 03/17/2023   Procedure: COLONOSCOPY WITH PROPOFOL;  Surgeon: Corbin Ade, MD;  Location: [facility];  Service: Endoscopy;  Laterality: N/A;   ESOPHAGOGASTRODUODENOSCOPY (EGD) WITH PROPOFOL N/A 04/24/2021   Surgeon: Lanelle Bal, DO;  duodenal erosions and gastritis biopsied (pathology with peptic duodenitis, reactive gastropathy with erosions/chronic inflammation, negative for H. pylori)   EYE SURGERY     Vatrectomy   HEMOSTASIS CLIP PLACEMENT  05/14/2021   Procedure: HEMOSTASIS CLIP PLACEMENT;  Surgeon: Corbin Ade, MD;  Location: [facility];  Service: Endoscopy;;   IR PERC TUN PERIT CATH WO PORT S&I /IMAG  09/15/2020   IR REMOVAL TUN CV CATH W/O FL  02/19/2021   IR US GUIDE VASC ACCESS  RIGHT  09/15/2020   POLYPECTOMY  04/24/2021   Procedure: POLYPECTOMY;  Surgeon: Earnest Bailey  K, DO;  Location: [facility];  Service: Endoscopy;;   POLYPECTOMY  05/14/2021   Procedure: POLYPECTOMY;  Surgeon: Corbin Ade, MD;  [facility]: [facility];  Service: Endoscopy;;   POLYPECTOMY  03/17/2023   Procedure: POLYPECTOMY;  Surgeon: Corbin Ade, MD;  [facility];  Service: Endoscopy;;   SKIN SPLIT GRAFT Bilateral 09/03/2021   Procedure: SKIN GRAFT BILATERAL LEGS;  Surgeon: Nadara Mustard, MD;  Location: [facility] OR;  Service: Orthopedics;  Laterality: Bilateral;   SKIN SPLIT GRAFT Left 12/10/2021   Procedure: IRRIGATION AND DEBRIDEMENT LEFT CALF, APPLICATION SPLIT THICKNESS SKIN GRAFT;  Surgeon: Nadara Mustard, MD;  Location: [facility] OR;  Service: Orthopedics;  Laterality: Left;   TOE SURGERY     Social History   Occupational History   Not on file  Tobacco Use   Smoking status: Never   Smokeless tobacco: Never  Vaping Use   Vaping status: Never Used  Substance and Sexual Activity   Alcohol use: No   Drug use: No   Sexual activity: Yes    Birth control/protection: Condom

## 2024-02-27 NOTE — Telephone Encounter (Signed)
 See phone note from [facility] yesterday

## 2024-02-28 NOTE — Telephone Encounter (Signed)
 Received approval for VOWST.

## 2024-02-29 NOTE — Telephone Encounter (Signed)
 Spoke to pt, informed her that medication was approved and should be shipped to her house. Informed her to call the office when she gets medications so she can get instructions on how to take it. She voiced understanding.

## 2024-02-29 NOTE — Telephone Encounter (Signed)
 Thank you! What do we do now? Will they send this to her? I want to make sure she calls [name] before taking it so we can review instructions.

## 2024-03-01 ENCOUNTER — Emergency Department (HOSPITAL_COMMUNITY)

## 2024-03-01 ENCOUNTER — Inpatient Hospital Stay (HOSPITAL_COMMUNITY)
Admission: EM | Admit: 2024-03-01 | Discharge: 2024-03-04 | DRG: 638 | Disposition: A | Discharge status: Home / Self Care | Attending: Internal Medicine | Admitting: Internal Medicine

## 2024-03-01 ENCOUNTER — Other Ambulatory Visit: Payer: Self-pay

## 2024-03-01 ENCOUNTER — Encounter (HOSPITAL_COMMUNITY): Payer: Self-pay | Admitting: *Deleted

## 2024-03-01 ENCOUNTER — Telehealth: Payer: Self-pay | Admitting: Orthopedic Surgery

## 2024-03-01 DIAGNOSIS — A498 Other bacterial infections of unspecified site: Secondary | ICD-10-CM | POA: Diagnosis present

## 2024-03-01 DIAGNOSIS — E785 Hyperlipidemia, unspecified: Secondary | ICD-10-CM | POA: Diagnosis present

## 2024-03-01 DIAGNOSIS — L89612 Pressure ulcer of right heel, stage 2: Secondary | ICD-10-CM | POA: Diagnosis present

## 2024-03-01 DIAGNOSIS — T464X5A Adverse effect of angiotensin-converting-enzyme inhibitors, initial encounter: Secondary | ICD-10-CM | POA: Diagnosis present

## 2024-03-01 DIAGNOSIS — G4733 Obstructive sleep apnea (adult) (pediatric): Secondary | ICD-10-CM | POA: Diagnosis present

## 2024-03-01 DIAGNOSIS — I132 Hypertensive heart and chronic kidney disease with heart failure and with stage 5 chronic kidney disease, or end stage renal disease: Secondary | ICD-10-CM | POA: Diagnosis present

## 2024-03-01 DIAGNOSIS — E11621 Type 2 diabetes mellitus with foot ulcer: Secondary | ICD-10-CM | POA: Diagnosis present

## 2024-03-01 DIAGNOSIS — Z841 Family history of disorders of kidney and ureter: Secondary | ICD-10-CM

## 2024-03-01 DIAGNOSIS — E11628 Type 2 diabetes mellitus with other skin complications: Principal | ICD-10-CM | POA: Diagnosis present

## 2024-03-01 DIAGNOSIS — I272 Pulmonary hypertension, unspecified: Secondary | ICD-10-CM | POA: Diagnosis present

## 2024-03-01 DIAGNOSIS — L03119 Cellulitis of unspecified part of limb: Principal | ICD-10-CM

## 2024-03-01 DIAGNOSIS — Z79890 Hormone replacement therapy (postmenopausal): Secondary | ICD-10-CM

## 2024-03-01 DIAGNOSIS — Z833 Family history of diabetes mellitus: Secondary | ICD-10-CM

## 2024-03-01 DIAGNOSIS — L97911 Non-pressure chronic ulcer of unspecified part of right lower leg limited to breakdown of skin: Secondary | ICD-10-CM | POA: Diagnosis present

## 2024-03-01 DIAGNOSIS — Z8249 Family history of ischemic heart disease and other diseases of the circulatory system: Secondary | ICD-10-CM

## 2024-03-01 DIAGNOSIS — E1122 Type 2 diabetes mellitus with diabetic chronic kidney disease: Secondary | ICD-10-CM | POA: Diagnosis present

## 2024-03-01 DIAGNOSIS — E119 Type 2 diabetes mellitus without complications: Secondary | ICD-10-CM

## 2024-03-01 DIAGNOSIS — E039 Hypothyroidism, unspecified: Secondary | ICD-10-CM | POA: Diagnosis present

## 2024-03-01 DIAGNOSIS — D631 Anemia in chronic kidney disease: Secondary | ICD-10-CM | POA: Diagnosis present

## 2024-03-01 DIAGNOSIS — A0471 Enterocolitis due to Clostridium difficile, recurrent: Secondary | ICD-10-CM | POA: Diagnosis present

## 2024-03-01 DIAGNOSIS — I9589 Other hypotension: Secondary | ICD-10-CM | POA: Diagnosis present

## 2024-03-01 DIAGNOSIS — Z89421 Acquired absence of other right toe(s): Secondary | ICD-10-CM

## 2024-03-01 DIAGNOSIS — Z794 Long term (current) use of insulin: Secondary | ICD-10-CM

## 2024-03-01 DIAGNOSIS — N2581 Secondary hyperparathyroidism of renal origin: Secondary | ICD-10-CM | POA: Diagnosis present

## 2024-03-01 DIAGNOSIS — D638 Anemia in other chronic diseases classified elsewhere: Secondary | ICD-10-CM | POA: Diagnosis present

## 2024-03-01 DIAGNOSIS — L03115 Cellulitis of right lower limb: Principal | ICD-10-CM

## 2024-03-01 DIAGNOSIS — I5032 Chronic diastolic (congestive) heart failure: Secondary | ICD-10-CM | POA: Diagnosis present

## 2024-03-01 DIAGNOSIS — N186 End stage renal disease: Secondary | ICD-10-CM

## 2024-03-01 DIAGNOSIS — Z992 Dependence on renal dialysis: Secondary | ICD-10-CM

## 2024-03-01 DIAGNOSIS — Z79899 Other long term (current) drug therapy: Secondary | ICD-10-CM

## 2024-03-01 DIAGNOSIS — E1151 Type 2 diabetes mellitus with diabetic peripheral angiopathy without gangrene: Secondary | ICD-10-CM | POA: Diagnosis present

## 2024-03-01 DIAGNOSIS — H409 Unspecified glaucoma: Secondary | ICD-10-CM | POA: Diagnosis present

## 2024-03-01 DIAGNOSIS — L97409 Non-pressure chronic ulcer of unspecified heel and midfoot with unspecified severity: Secondary | ICD-10-CM | POA: Diagnosis present

## 2024-03-01 DIAGNOSIS — D696 Thrombocytopenia, unspecified: Secondary | ICD-10-CM | POA: Diagnosis present

## 2024-03-01 DIAGNOSIS — I739 Peripheral vascular disease, unspecified: Secondary | ICD-10-CM | POA: Diagnosis present

## 2024-03-01 LAB — COMPREHENSIVE METABOLIC PANEL WITH GFR
ALT: 18 U/L (ref 0–44)
AST: 20 U/L (ref 15–41)
Albumin: 4 g/dL (ref 3.5–5.0)
Alkaline Phosphatase: 80 U/L (ref 38–126)
Anion gap: 13 (ref 5–15)
BUN: 25 mg/dL — ABNORMAL HIGH (ref 6–20)
CO2: 28 mmol/L (ref 22–32)
Calcium: 9.3 mg/dL (ref 8.9–10.3)
Chloride: 98 mmol/L (ref 98–111)
Creatinine, Ser: 5.95 mg/dL — ABNORMAL HIGH (ref 0.44–1.00)
GFR, Estimated: 8 mL/min — ABNORMAL LOW (ref >60)
Glucose, Bld: 81 mg/dL (ref 70–99)
Potassium: 4.1 mmol/L (ref 3.5–5.1)
Sodium: 139 mmol/L (ref 135–145)
Total Bilirubin: 1 mg/dL (ref 0.0–1.2)
Total Protein: 6.9 g/dL (ref 6.5–8.1)

## 2024-03-01 LAB — CBC
HCT: 38.4 % (ref 36.0–46.0)
Hemoglobin: 11.7 g/dL — ABNORMAL LOW (ref 12.0–15.0)
MCH: 28.5 pg (ref 26.0–34.0)
MCHC: 30.5 g/dL (ref 30.0–36.0)
MCV: 93.4 fL (ref 80.0–100.0)
Platelets: 80 10*3/uL — ABNORMAL LOW (ref 150–400)
RBC: 4.11 MIL/uL (ref 3.87–5.11)
RDW: 13.5 % (ref 11.5–15.5)
WBC: 5.4 10*3/uL (ref 4.0–10.5)
nRBC: 0 % (ref 0.0–0.2)

## 2024-03-01 NOTE — Telephone Encounter (Signed)
 [facility] Nurse called and would like to advise that patients right foot/hell area is dripping blood. She advised patient to go to the ED. She just wanted to let us know. Also advised on message below.   Nurse CB [phone]  PT CB [phone]

## 2024-03-01 NOTE — Telephone Encounter (Signed)
 Sure, yes 4 layer wrap with unna and profore

## 2024-03-01 NOTE — Telephone Encounter (Signed)
 [name] called. She is the home health nurse working with patient at home. She would like orders to put layer 2 on before layer 1 so the cotton will not stick to her wound. Her cb# [phone]

## 2024-03-01 NOTE — ED Triage Notes (Signed)
 The pt has a foot ulcer on her rt heel   she saw dr duda this past Tuesday  and she was started on an antibiotic  the home health nurse came to dress her wound earlier today  and the wound started bleeding.  Dr duda told her to come to the ed and get iv  antibiotics

## 2024-03-02 ENCOUNTER — Encounter (HOSPITAL_COMMUNITY): Payer: Self-pay | Admitting: Internal Medicine

## 2024-03-02 DIAGNOSIS — E11628 Type 2 diabetes mellitus with other skin complications: Secondary | ICD-10-CM | POA: Diagnosis present

## 2024-03-02 DIAGNOSIS — I5032 Chronic diastolic (congestive) heart failure: Secondary | ICD-10-CM | POA: Diagnosis present

## 2024-03-02 DIAGNOSIS — I272 Pulmonary hypertension, unspecified: Secondary | ICD-10-CM | POA: Diagnosis present

## 2024-03-02 DIAGNOSIS — I9589 Other hypotension: Secondary | ICD-10-CM

## 2024-03-02 DIAGNOSIS — Z992 Dependence on renal dialysis: Secondary | ICD-10-CM

## 2024-03-02 DIAGNOSIS — A498 Other bacterial infections of unspecified site: Secondary | ICD-10-CM

## 2024-03-02 DIAGNOSIS — L03115 Cellulitis of right lower limb: Secondary | ICD-10-CM

## 2024-03-02 DIAGNOSIS — D696 Thrombocytopenia, unspecified: Secondary | ICD-10-CM

## 2024-03-02 DIAGNOSIS — I739 Peripheral vascular disease, unspecified: Secondary | ICD-10-CM | POA: Diagnosis not present

## 2024-03-02 DIAGNOSIS — A0471 Enterocolitis due to Clostridium difficile, recurrent: Secondary | ICD-10-CM | POA: Diagnosis present

## 2024-03-02 DIAGNOSIS — L97409 Non-pressure chronic ulcer of unspecified heel and midfoot with unspecified severity: Secondary | ICD-10-CM | POA: Diagnosis not present

## 2024-03-02 DIAGNOSIS — Z794 Long term (current) use of insulin: Secondary | ICD-10-CM | POA: Diagnosis not present

## 2024-03-02 DIAGNOSIS — E1122 Type 2 diabetes mellitus with diabetic chronic kidney disease: Secondary | ICD-10-CM | POA: Diagnosis present

## 2024-03-02 DIAGNOSIS — N186 End stage renal disease: Secondary | ICD-10-CM

## 2024-03-02 DIAGNOSIS — N2581 Secondary hyperparathyroidism of renal origin: Secondary | ICD-10-CM | POA: Diagnosis present

## 2024-03-02 DIAGNOSIS — L03119 Cellulitis of unspecified part of limb: Secondary | ICD-10-CM | POA: Diagnosis present

## 2024-03-02 DIAGNOSIS — E785 Hyperlipidemia, unspecified: Secondary | ICD-10-CM | POA: Diagnosis present

## 2024-03-02 DIAGNOSIS — Z79899 Other long term (current) drug therapy: Secondary | ICD-10-CM | POA: Diagnosis not present

## 2024-03-02 DIAGNOSIS — G4733 Obstructive sleep apnea (adult) (pediatric): Secondary | ICD-10-CM | POA: Diagnosis present

## 2024-03-02 DIAGNOSIS — E039 Hypothyroidism, unspecified: Secondary | ICD-10-CM | POA: Diagnosis present

## 2024-03-02 DIAGNOSIS — E1151 Type 2 diabetes mellitus with diabetic peripheral angiopathy without gangrene: Secondary | ICD-10-CM | POA: Diagnosis present

## 2024-03-02 DIAGNOSIS — D631 Anemia in chronic kidney disease: Secondary | ICD-10-CM | POA: Diagnosis present

## 2024-03-02 DIAGNOSIS — L89612 Pressure ulcer of right heel, stage 2: Secondary | ICD-10-CM | POA: Diagnosis present

## 2024-03-02 DIAGNOSIS — I132 Hypertensive heart and chronic kidney disease with heart failure and with stage 5 chronic kidney disease, or end stage renal disease: Secondary | ICD-10-CM | POA: Diagnosis present

## 2024-03-02 DIAGNOSIS — Z8249 Family history of ischemic heart disease and other diseases of the circulatory system: Secondary | ICD-10-CM | POA: Diagnosis not present

## 2024-03-02 DIAGNOSIS — Z79890 Hormone replacement therapy (postmenopausal): Secondary | ICD-10-CM | POA: Diagnosis not present

## 2024-03-02 DIAGNOSIS — E119 Type 2 diabetes mellitus without complications: Secondary | ICD-10-CM

## 2024-03-02 DIAGNOSIS — E11621 Type 2 diabetes mellitus with foot ulcer: Secondary | ICD-10-CM | POA: Diagnosis present

## 2024-03-02 DIAGNOSIS — D638 Anemia in other chronic diseases classified elsewhere: Secondary | ICD-10-CM

## 2024-03-02 DIAGNOSIS — L97911 Non-pressure chronic ulcer of unspecified part of right lower leg limited to breakdown of skin: Secondary | ICD-10-CM

## 2024-03-02 DIAGNOSIS — Z841 Family history of disorders of kidney and ureter: Secondary | ICD-10-CM | POA: Diagnosis not present

## 2024-03-02 LAB — COMPREHENSIVE METABOLIC PANEL WITH GFR
ALT: 15 U/L (ref 0–44)
AST: 17 U/L (ref 15–41)
Albumin: 4 g/dL (ref 3.5–5.0)
Alkaline Phosphatase: 83 U/L (ref 38–126)
Anion gap: 15 (ref 5–15)
BUN: 28 mg/dL — ABNORMAL HIGH (ref 6–20)
CO2: 26 mmol/L (ref 22–32)
Calcium: 9.4 mg/dL (ref 8.9–10.3)
Chloride: 97 mmol/L — ABNORMAL LOW (ref 98–111)
Creatinine, Ser: 6.43 mg/dL — ABNORMAL HIGH (ref 0.44–1.00)
GFR, Estimated: 7 mL/min — ABNORMAL LOW (ref >60)
Glucose, Bld: 85 mg/dL (ref 70–99)
Potassium: 4 mmol/L (ref 3.5–5.1)
Sodium: 138 mmol/L (ref 135–145)
Total Bilirubin: 1 mg/dL (ref 0.0–1.2)
Total Protein: 6.8 g/dL (ref 6.5–8.1)

## 2024-03-02 LAB — GLUCOSE, CAPILLARY
Glucose-Capillary: 84 mg/dL (ref 70–99)
Glucose-Capillary: 173 mg/dL — ABNORMAL HIGH (ref 70–99)
Glucose-Capillary: 133 mg/dL — ABNORMAL HIGH (ref 70–99)
Glucose-Capillary: 102 mg/dL — ABNORMAL HIGH (ref 70–99)
Glucose-Capillary: 178 mg/dL — ABNORMAL HIGH (ref 70–99)

## 2024-03-02 LAB — CBC
HCT: 37.7 % (ref 36.0–46.0)
Hemoglobin: 11.5 g/dL — ABNORMAL LOW (ref 12.0–15.0)
MCH: 28.2 pg (ref 26.0–34.0)
MCHC: 30.5 g/dL (ref 30.0–36.0)
MCV: 92.4 fL (ref 80.0–100.0)
Platelets: 80 10*3/uL — ABNORMAL LOW (ref 150–400)
RBC: 4.08 MIL/uL (ref 3.87–5.11)
RDW: 13.5 % (ref 11.5–15.5)
WBC: 5.3 10*3/uL (ref 4.0–10.5)
nRBC: 0 % (ref 0.0–0.2)

## 2024-03-02 LAB — HEPATITIS B SURFACE ANTIGEN: Hepatitis B Surface Ag: NONREACTIVE

## 2024-03-02 MED ORDER — ATORVASTATIN CALCIUM 10 MG PO TABS
10.0000 mg | ORAL_TABLET | Freq: Every evening | ORAL | Status: DC
  Administered 2024-03-02 – 2024-03-03 (×2): 10 mg via ORAL
  Filled 2024-03-02 (×2): qty 1

## 2024-03-02 MED ORDER — ACETAMINOPHEN 325 MG PO TABS: 650.0000 mg | ORAL_TABLET | Freq: Four times a day (QID) | ORAL | Status: DC | PRN

## 2024-03-02 MED ORDER — LIDOCAINE-PRILOCAINE 2.5-2.5 % EX CREA: 1.0000 | TOPICAL_CREAM | CUTANEOUS | Status: DC | PRN

## 2024-03-02 MED ORDER — SODIUM CHLORIDE 0.9 % IV SOLN
2.0000 g | INTRAVENOUS | Status: DC
  Administered 2024-03-02 – 2024-03-03 (×2): 2 g via INTRAVENOUS
  Filled 2024-03-02 (×2): qty 20

## 2024-03-02 MED ORDER — VANCOMYCIN HCL 125 MG PO CAPS: 125.0000 mg | ORAL_CAPSULE | ORAL | Status: DC

## 2024-03-02 MED ORDER — INSULIN GLARGINE-YFGN 100 UNIT/ML ~~LOC~~ SOLN
8.0000 [IU] | Freq: Every day | SUBCUTANEOUS | Status: DC
  Administered 2024-03-02 – 2024-03-03 (×2): 8 [IU] via SUBCUTANEOUS
  Filled 2024-03-02 (×3): qty 0.08

## 2024-03-02 MED ORDER — SODIUM CHLORIDE 0.9% FLUSH
3.0000 mL | Freq: Two times a day (BID) | INTRAVENOUS | Status: DC
  Administered 2024-03-02 – 2024-03-04 (×4): 3 mL via INTRAVENOUS

## 2024-03-02 MED ORDER — SODIUM CHLORIDE 0.9% FLUSH: 3.0000 mL | INTRAVENOUS | Status: DC | PRN

## 2024-03-02 MED ORDER — LIDOCAINE HCL (PF) 1 % IJ SOLN: 5.0000 mL | INTRAMUSCULAR | Status: DC | PRN

## 2024-03-02 MED ORDER — VANCOMYCIN HCL IN DEXTROSE 1-5 GM/200ML-% IV SOLN: 1000.0000 mg | INTRAVENOUS | Status: DC

## 2024-03-02 MED ORDER — VANCOMYCIN HCL IN DEXTROSE 1-5 GM/200ML-% IV SOLN
1000.0000 mg | INTRAVENOUS | Status: DC
  Administered 2024-03-02: 1000 mg via INTRAVENOUS

## 2024-03-02 MED ORDER — VANCOMYCIN HCL 125 MG PO CAPS
125.0000 mg | ORAL_CAPSULE | ORAL | Status: DC
  Administered 2024-03-03: 125 mg via ORAL
  Filled 2024-03-02: qty 1

## 2024-03-02 MED ORDER — INSULIN ASPART 100 UNIT/ML IJ SOLN
0.0000 [IU] | Freq: Three times a day (TID) | INTRAMUSCULAR | Status: DC
  Administered 2024-03-02 – 2024-03-03 (×2): 1 [IU] via SUBCUTANEOUS
  Administered 2024-03-03: 2 [IU] via SUBCUTANEOUS

## 2024-03-02 MED ORDER — VANCOMYCIN HCL IN DEXTROSE 1-5 GM/200ML-% IV SOLN
1000.0000 mg | Freq: Once | INTRAVENOUS | Status: DC
  Filled 2024-03-02: qty 200

## 2024-03-02 MED ORDER — SODIUM CHLORIDE 0.9 % IV SOLN: 250.0000 mL | INTRAVENOUS | Status: AC | PRN

## 2024-03-02 MED ORDER — LEVOTHYROXINE SODIUM 75 MCG PO TABS
75.0000 ug | ORAL_TABLET | Freq: Every day | ORAL | Status: DC
  Administered 2024-03-02 – 2024-03-04 (×3): 75 ug via ORAL
  Filled 2024-03-02 (×3): qty 1

## 2024-03-02 MED ORDER — MIDODRINE HCL 5 MG PO TABS
10.0000 mg | ORAL_TABLET | Freq: Three times a day (TID) | ORAL | Status: DC
  Administered 2024-03-02 – 2024-03-04 (×7): 10 mg via ORAL
  Filled 2024-03-02 (×8): qty 2

## 2024-03-02 MED ORDER — ONDANSETRON HCL 4 MG PO TABS: 4.0000 mg | ORAL_TABLET | Freq: Four times a day (QID) | ORAL | Status: DC | PRN

## 2024-03-02 MED ORDER — VANCOMYCIN HCL 2000 MG/400ML IV SOLN
2000.0000 mg | Freq: Once | INTRAVENOUS | Status: AC
  Administered 2024-03-02: 2000 mg via INTRAVENOUS
  Filled 2024-03-02 (×2): qty 400

## 2024-03-02 MED ORDER — FERRIC CITRATE 1 GM 210 MG(FE) PO TABS
630.0000 mg | ORAL_TABLET | Freq: Three times a day (TID) | ORAL | Status: DC
  Administered 2024-03-03 – 2024-03-04 (×4): 630 mg via ORAL
  Filled 2024-03-02 (×10): qty 3

## 2024-03-02 MED ORDER — HEPARIN SODIUM (PORCINE) 1000 UNIT/ML DIALYSIS: 1000.0000 [IU] | INTRAMUSCULAR | Status: DC | PRN

## 2024-03-02 MED ORDER — CHLORHEXIDINE GLUCONATE CLOTH 2 % EX PADS
6.0000 | MEDICATED_PAD | Freq: Every day | CUTANEOUS | Status: DC
  Administered 2024-03-02 – 2024-03-04 (×3): 6 via TOPICAL

## 2024-03-02 MED ORDER — ONDANSETRON HCL 4 MG/2ML IJ SOLN
4.0000 mg | Freq: Four times a day (QID) | INTRAMUSCULAR | Status: DC | PRN
Start: 2024-03-02 — End: 2024-03-04

## 2024-03-02 MED ORDER — DIPHENOXYLATE-ATROPINE 2.5-0.025 MG PO TABS
2.0000 | ORAL_TABLET | Freq: Three times a day (TID) | ORAL | Status: DC | PRN
  Administered 2024-03-02 (×2): 2 via ORAL
  Filled 2024-03-02 (×2): qty 2

## 2024-03-02 MED ORDER — HEPARIN SODIUM (PORCINE) 5000 UNIT/ML IJ SOLN
5000.0000 [IU] | Freq: Three times a day (TID) | INTRAMUSCULAR | Status: DC
  Administered 2024-03-02 – 2024-03-04 (×6): 5000 [IU] via SUBCUTANEOUS
  Filled 2024-03-02 (×7): qty 1

## 2024-03-02 MED ORDER — PENTAFLUOROPROP-TETRAFLUOROETH EX AERO: 1.0000 | INHALATION_SPRAY | CUTANEOUS | Status: DC | PRN

## 2024-03-02 MED ORDER — ANTICOAGULANT SODIUM CITRATE 4% (200MG/5ML) IV SOLN: 5.0000 mL | Status: DC | PRN

## 2024-03-02 MED ORDER — INSULIN ASPART 100 UNIT/ML IJ SOLN: 0.0000 [IU] | Freq: Every day | INTRAMUSCULAR | Status: DC

## 2024-03-02 MED ORDER — ACETAMINOPHEN 650 MG RE SUPP: 650.0000 mg | Freq: Four times a day (QID) | RECTAL | Status: DC | PRN

## 2024-03-02 MED ORDER — ALTEPLASE 2 MG IJ SOLR: 2.0000 mg | Freq: Once | INTRAMUSCULAR | Status: DC | PRN

## 2024-03-02 NOTE — Progress Notes (Addendum)
 Pharmacy Antibiotic Note  Amy Moses is a 53 y.o. female admitted on 03/01/2024 with foot ulcer/concerns for cellulitis.  Pharmacy has been consulted for vancomycin dosing. Recently started on doxycycline with increased redness. PMH includes ESRD (HD TTS) and hx of C. Diff colitis w/ persistent diarrhea (last C. Diff labs negative on 3/28) currently finishing Vanc PO taper  -Vancomycin 2g IV x1 -Blood cultures ordered -WBC 5.4, afebrile  Plan: -Vancomycin 2g IV x1 -Vancomycin 1000mg  IV  after every HD session  -Follow up signs of clinical improvement, LOT, de-escalation of antibiotics    Height: 5\' 6"  (167.6 cm) Weight: 90.2 kg (198 lb 13.7 oz) IBW/kg (Calculated) : 59.3  Temp (24hrs), Avg:97.5 F (36.4 C), Min:97.5 F (36.4 C), Max:97.5 F (36.4 C)  Recent Labs  Lab 03/01/24 1804  WBC 5.4  CREATININE 5.95*    Estimated Creatinine Clearance: 12.8 mL/min (A) (by C-G formula based on SCr of 5.95 mg/dL (H)).    Allergies  Allergen Reactions   Ace Inhibitors Cough    Antimicrobials this admission: Vancomycin 4/12 >>   Microbiology results: 4/12 BCx:   Thank you for allowing pharmacy to be a part of this patient's care.  Young Hensen, PharmD. Clinical Pharmacist 03/02/2024 2:35 AM

## 2024-03-02 NOTE — Progress Notes (Signed)
 PROGRESS NOTE    Amy Moses  WJX:[id number] DOB: December 07, 1970 DOA: 03/01/2024 PCP: Dorrine Gaudy, DO  Outpatient Specialists:     Brief Narrative:  As per H&P done on admission: "Amy Moses is a 53 y.o. female with medical history significant of right lower extremity nonhealing ulcer follows orthopedist Dr. Julio Ohm outpatient, peripheral vascular disease, pressure ulcer of the right heel stage II, ESRD on dialysis TTS schedule, chronic hypotension on midodrine, DM type II, hypothyroidism, OSA, thrombocytopenia, chronic diastolic heart failure, and recurrent C. difficile colitis presented to emergency department referred from orthopedics clinic for evaluation of the right sided foot ulcer as it has been started bleeding. Patient denies any fever, chill and lower extremities pain.     ED Course:  On presentation to ED patient is hemodynamically stable. CBC showing stable H&H 11.7 and 38.  Normal WBC count and low platelet count 80. CMP unremarkable except evidence of ESRD.  BUN 25 and creatinine 5.95.   X-ray of the right foot Limited evaluation due to overlying bandage.Recommend repeat dedicated calcaneal radiographs. 2. Otherwise no findings of acute osteomyelitis. 3.  No acute displaced fracture or dislocation.   ED physician reported that during the bandage change did not notice any evidence of bleeding however right-sided for ankle having some redness concern for development of cellulitis.   Hospitalist has been consulted for further evaluation management of chronic diabetic pressure ulcer wound and associated cellulitis".  03/02/2024: Patient seen on hemodialysis.  Patient has required right lower leg cellulitis, significant scaly skin involving right lower leg.  Unna boot on left lower leg.  No new complaints.   Assessment & Plan:   Principal Problem:   Cellulitis of right ankle Active Problems:   Insulin dependent type 2 diabetes mellitus (HCC)   Acquired  hypothyroidism   Anemia of chronic disease   ESRD on dialysis (HCC)   Calciphylaxis   Chronic diastolic CHF (congestive heart failure) (HCC)   Calciphylaxis of right lower extremity with nonhealing ulcer, limited to breakdown of skin (HCC)   Thrombocytopenia (HCC)   Peripheral vascular disease (HCC)   Nonhealing ulcer of heel (HCC)   Chronic hypotension   Recurrent Clostridioides difficile infection   Right-sided ankle cellulitis Chronic nonhealing pressure ulcer of right ankle Calciphylaxis of the right lower extremity with nonhealing ulcer Peripheral vascular disease -Patient has been referred from the orthopedics clinic for evaluation for the right side ankle cellulitis.  Patient reported that during the dressing changes noticed some bleeding.  Physical exam showing extensive calcification, calciphylaxis, nonhealing pressure ulcer of the ankle.  Picture is on the chart for review. - Patient is afebrile.  No leukocytosis.  X-ray of the right foot no evidence of osteomyelitis. -Given patient is worsening nonhealing ulcer of the right lower extremity starting IV vancomycin.  Blood cultures are pending. -Consulted wound care. 03/02/2024: Continue IV vancomycin.  Add IV Rocephin.  Consider orthopedic surgery consult.   ESRD on HD TTS schedule -ESRD on dialysis TTS schedule.  Consulted nephrology and inform Dr. Jearldine Mina. 03/02/2024: Seen on hemodialysis today.  Appreciate nephrology input.   Chronic diastolic heart failure Chronic hypotension on midodrine -No evidence of volume overload.  Continue midodrine 10 mg 3 times daily.   Thrombocytopenia -Low platelet count 80.  Continue to monitor for bleeding.   Recurrent C. difficile associated colitis -Continue vancomycin taper dose to complete until 4/27   Anemia of chronic disease -Stable H&H.  Continue oral iron supplement.   Insulin-dependent DM type II - Continue Semglee and  sliding scale insulin with mealtime coverage.    Hypothyroidism - Continue levothyroxine   Hyperlipidemia - Continue Lipitor   DVT prophylaxis: Subcutaneous heparin. Code Status: Full code. Family Communication:  Disposition Plan: Patient remains inpatient.   Consultants:  Nephrology.  Procedures:  Awaiting hemodialysis.  Antimicrobials:  IV vancomycin.   Subjective: No new complaints.  Objective: Vitals:   03/02/24 1430 03/02/24 1500 03/02/24 1536 03/02/24 1541  BP: 123/71 116/64 108/69 120/66  Pulse: 68 65 64 64  Resp: 20 15 17 15   Temp:   98.4 F (36.9 C)   TempSrc:      SpO2: 96% 97% 99% 90%  Weight:    74.8 kg  Height:        Intake/Output Summary (Last 24 hours) at 03/02/2024 1731 Last data filed at 03/02/2024 1541 Gross per 24 hour  Intake 372.71 ml  Output 1000 ml  Net -627.29 ml   Filed Weights   03/01/24 1755 03/02/24 1135 03/02/24 1541  Weight: 90.2 kg 75.8 kg 74.8 kg    Examination:  General exam: Appears calm and comfortable.  Patient is pale. Respiratory system: Clear to auscultation.  Cardiovascular system: S1 & S2, systolic murmur.  Gastrointestinal system: Abdomen is soft and nontender.. Central nervous system: Awake and alert.  Patient moves extremities.   Extremities: Unna boot to left lower leg.  Cellulitic change right lower leg, with scaly skin  Data Reviewed: I have personally reviewed following labs and imaging studies  CBC: Recent Labs  Lab 03/01/24 1804 03/02/24 0339  WBC 5.4 5.3  HGB 11.7* 11.5*  HCT 38.4 37.7  MCV 93.4 92.4  PLT 80* 80*   Basic Metabolic Panel: Recent Labs  Lab 03/01/24 1804 03/02/24 0339  NA 139 138  K 4.1 4.0  CL 98 97*  CO2 28 26  GLUCOSE 81 85  BUN 25* 28*  CREATININE 5.95* 6.43*  CALCIUM 9.3 9.4   GFR: Estimated Creatinine Clearance: 10.8 mL/min (A) (by C-G formula based on SCr of 6.43 mg/dL (H)). Liver Function Tests: Recent Labs  Lab 03/01/24 1804 03/02/24 0339  AST 20 17  ALT 18 15  ALKPHOS 80 83  BILITOT 1.0 1.0   PROT 6.9 6.8  ALBUMIN 4.0 4.0   No results for input(s): "LIPASE", "AMYLASE" in the last 168 hours. No results for input(s): "AMMONIA" in the last 168 hours. Coagulation Profile: No results for input(s): "INR", "PROTIME" in the last 168 hours. Cardiac Enzymes: No results for input(s): "CKTOTAL", "CKMB", "CKMBINDEX", "TROPONINI" in the last 168 hours. BNP (last 3 results) No results for input(s): "PROBNP" in the last 8760 hours. HbA1C: No results for input(s): "HGBA1C" in the last 72 hours. CBG: Recent Labs  Lab 03/02/24 0349 03/02/24 0746 03/02/24 1111 03/02/24 1702  GLUCAP 84 173* 133* 102*   Lipid Profile: No results for input(s): "CHOL", "HDL", "LDLCALC", "TRIG", "CHOLHDL", "LDLDIRECT" in the last 72 hours. Thyroid Function Tests: No results for input(s): "TSH", "T4TOTAL", "FREET4", "T3FREE", "THYROIDAB" in the last 72 hours. Anemia Panel: No results for input(s): "VITAMINB12", "FOLATE", "FERRITIN", "TIBC", "IRON", "RETICCTPCT" in the last 72 hours. Urine analysis: No results found for: "COLORURINE", "APPEARANCEUR", "LABSPEC", "PHURINE", "GLUCOSEU", "HGBUR", "BILIRUBINUR", "KETONESUR", "PROTEINUR", "UROBILINOGEN", "NITRITE", "LEUKOCYTESUR" Sepsis Labs: @LABRCNTIP (procalcitonin:4,lacticidven:4)  )No results found for this or any previous visit (from the past 240 hours).       Radiology Studies: DG Foot Complete Right Result Date: 03/01/2024 CLINICAL DATA:  ulcer EXAM: RIGHT FOOT COMPLETE - 3+ VIEW COMPARISON:  X-ray right foot 01/13/2024 FINDINGS: Prior  fifth digit amputation. There is no evidence of fracture or dislocation. Old healed metatarsal fractures. No cortical erosion or destruction. Severe degenerative changes and hallux valgus of the first digit metatarsophalangeal joint. Dorsal foot subcutaneus soft tissue edema. Limited evaluation of the heel due to overlying bandage/cast. Vascular calcifications. Partially visualized plate and screw fixation of the distal  tibia and fibula. IMPRESSION: 1. Limited evaluation of the heel due to overlying bandage/cast. Recommend repeat dedicated calcaneal radiographs. 2. Otherwise no findings of acute osteomyelitis. 3.  No acute displaced fracture or dislocation. Electronically Signed   By: Morgane  Naveau M.D.   On: 03/01/2024 19:56        Scheduled Meds:  atorvastatin  10 mg Oral QPM   Chlorhexidine Gluconate Cloth  6 each Topical Q0600   ferric citrate  630 mg Oral TID WC   heparin  5,000 Units Subcutaneous Q8H   insulin aspart  0-5 Units Subcutaneous QHS   insulin aspart  0-6 Units Subcutaneous TID WC   insulin glargine-yfgn  8 Units Subcutaneous QHS   levothyroxine  75 mcg Oral Q0600   midodrine  10 mg Oral TID WC   sodium chloride flush  3 mL Intravenous Q12H   sodium chloride flush  3 mL Intravenous Q12H   [START ON 03/03/2024] vancomycin  125 mg Oral QODAY   Followed by   Cecily Cohen ON 03/17/2024] vancomycin  125 mg Oral Q3 days   Continuous Infusions:  sodium chloride     vancomycin 1,000 mg (03/02/24 1705)   vancomycin     Followed by   Cecily Cohen ON 03/05/2024] vancomycin       LOS: 0 days    Time spent: 35 minutes.    Fonnie Iba, MD  [facility] Pager #: [phone] 7PM-7AM contact night coverage as above

## 2024-03-02 NOTE — Plan of Care (Signed)
   Problem: Nutritional: Goal: Maintenance of adequate nutrition will improve Outcome: Progressing   Problem: Skin Integrity: Goal: Risk for impaired skin integrity will decrease Outcome: Progressing

## 2024-03-02 NOTE — Procedures (Signed)
 I was present at this dialysis session. I have reviewed the session and made appropriate changes.   3K bath. 1L UF goal. No issues  Filed Weights   03/01/24 1755 03/02/24 1135  Weight: 90.2 kg 75.8 kg    Recent Labs  Lab 03/02/24 0339  NA 138  K 4.0  CL 97*  CO2 26  GLUCOSE 85  BUN 28*  CREATININE 6.43*  CALCIUM 9.4    Recent Labs  Lab 03/01/24 1804 03/02/24 0339  WBC 5.4 5.3  HGB 11.7* 11.5*  HCT 38.4 37.7  MCV 93.4 92.4  PLT 80* 80*    Scheduled Meds:  atorvastatin  10 mg Oral QPM   Chlorhexidine Gluconate Cloth  6 each Topical Q0600   ferric citrate  630 mg Oral TID WC   heparin  5,000 Units Subcutaneous Q8H   insulin aspart  0-5 Units Subcutaneous QHS   insulin aspart  0-6 Units Subcutaneous TID WC   insulin glargine-yfgn  8 Units Subcutaneous QHS   levothyroxine  75 mcg Oral Q0600   midodrine  10 mg Oral TID WC   sodium chloride flush  3 mL Intravenous Q12H   sodium chloride flush  3 mL Intravenous Q12H   [START ON 03/03/2024] vancomycin  125 mg Oral QODAY   Followed by   Cecily Cohen ON 03/17/2024] vancomycin  125 mg Oral Q3 days   Continuous Infusions:  sodium chloride     anticoagulant sodium citrate     vancomycin     PRN Meds:.sodium chloride, acetaminophen **OR** acetaminophen, alteplase, anticoagulant sodium citrate, diphenoxylate-atropine, heparin, lidocaine (PF), lidocaine-prilocaine, ondansetron **OR** ondansetron (ZOFRAN) IV, pentafluoroprop-tetrafluoroeth, sodium chloride flush   Fawn Hooks  MD 03/02/2024, 12:00 PM

## 2024-03-02 NOTE — Progress Notes (Addendum)
 Received patient in bed to unit.  Alert and oriented x 3 Informed consent signed and in chart.   TX duration: 3.5 hours  Patient tolerated well.  Transported back to the room via bed Alert, without acute distress.  Hand-off given to patient's nurse. Ornella RN  Access used: L AVF Access issues: None  Total UF removed: 1000 Medication(s) given: None Post HD weight: 74.8KG Post HD VS: 112,69, 14 Resp, 95 % on RA, 63 P   [place]ah Dulany S Shreya Lacasse RN, DNP [facility]

## 2024-03-02 NOTE — Progress Notes (Signed)
 Pharmacy Antibiotic Note  Amy Moses is a 53 y.o. female admitted on 03/01/2024 with foot ulcer/concerns for cellulitis.  Pharmacy has been consulted for vancomycin dosing. Recently started on doxycycline with increased redness. PMH includes ESRD (HD TTS) and hx of C. Diff colitis w/ persistent diarrhea (last C. Diff labs negative on 3/28) currently finishing Vanc PO taper  -Vancomycin 2g IV x1 prior to HD today. -Blood cultures ordered -WBC 5.4, afebrile  Plan: - Vancomycin 1000 mg post-HD, then Vancomycin 1000 mg TTS with HD -Follow up signs of clinical improvement, LOT, de-escalation of antibiotics    Height: 5\' 6"  (167.6 cm) Weight: 75.8 kg (167 lb 1.7 oz) IBW/kg (Calculated) : 59.3  Temp (24hrs), Avg:97.8 F (36.6 C), Min:97.5 F (36.4 C), Max:98.6 F (37 C)  Recent Labs  Lab 03/01/24 1804 03/02/24 0339  WBC 5.4 5.3  CREATININE 5.95* 6.43*    Estimated Creatinine Clearance: 10.9 mL/min (A) (by C-G formula based on SCr of 6.43 mg/dL (H)).    Allergies  Allergen Reactions   Ace Inhibitors Cough    Antimicrobials this admission: Vancomycin 4/12 >>   Microbiology results: 4/12 BCx:   Thank you for allowing pharmacy to be a part of this patient's care.  Young Hensen, PharmD. Clinical Pharmacist 03/02/2024 2:05 PM

## 2024-03-02 NOTE — H&P (Addendum)
 History and Physical    Amy Moses ZDG:[id number] DOB: 10-09-1971 DOA: 03/01/2024  PCP: Dorrine Gaudy, DO   Patient coming from: Home   Chief Complaint:  Chief Complaint  Patient presents with   Foot Ulcer   ED TRIAGE note:  The pt has a foot ulcer on her rt heel   she saw dr duda this past Tuesday  and she was started on an antibiotic  the home health nurse came to dress her wound earlier today  and the wound started bleeding.  Dr duda told her to come to the ed and get iv  antibiotics      HPI:  Amy Moses is a 53 y.o. female with medical history significant of right lower extremity nonhealing ulcer follows orthopedist Dr. Julio Ohm outpatient, peripheral vascular disease, pressure ulcer of the right heel stage II, ESRD on dialysis TTS schedule, chronic hypotension on midodrine, DM type II, hypothyroidism, OSA, thrombocytopenia, chronic diastolic heart failure, and recurrent C. difficile colitis presented to emergency department referred from orthopedics clinic for evaluation of the right sided foot ulcer as it has been started bleeding. Patient denies any fever, chill and lower extremities pain.   ED Course:  At presentation to ED patient is hemodynamically stable. CBC showing stable H&H 11.7 and 38.  Normal WBC count and low platelet count 80. CMP unremarkable except evidence of ESRD.  BUN 25 and creatinine 5.95.  X-ray of the right foot Limited evaluation due to overlying bandage.Recommend repeat dedicated calcaneal radiographs. 2. Otherwise no findings of acute osteomyelitis. 3.  No acute displaced fracture or dislocation.  ED physician reported that during the bandage change did not notice any evidence of bleeding however right-sided for ankle having some redness concern for development of cellulitis.  Hospitalist has been consulted for further evaluation management of chronic diabetic pressure ulcer wound and associated cellulitis.   Significant labs in the  ED: Lab Orders         Culture, blood (Routine X 2) w Reflex to ID Panel         Comprehensive metabolic panel         CBC         Comprehensive metabolic panel         CBC       Review of Systems:  Review of Systems  All other systems reviewed and are negative.   Past Medical History:  Diagnosis Date   Anemia    Blindness of right eye with low vision in contralateral eye    s/p victrectomy   Chronic diastolic heart failure (HCC) 03/30/2021   Diabetes mellitus, type II (HCC)    Dyslipidemia    ESRD on hemodialysis (HCC)    TTS in [place] , [place]   Glaucoma    History of blood transfusion    Hypertension    Hypothyroidism (acquired)    Mitral regurgitation 11/16/2022   Pneumonia    Pulmonary hypertension, unspecified (HCC) 09/12/2022    Past Surgical History:  Procedure Laterality Date   ABDOMINAL AORTOGRAM W/LOWER EXTREMITY Bilateral 12/18/2020   Procedure: ABDOMINAL AORTOGRAM W/LOWER EXTREMITY;  Surgeon: Richrd Char, MD;  Location: [facility];  Service: Cardiovascular;  Laterality: Bilateral;   ABDOMINAL AORTOGRAM W/LOWER EXTREMITY Bilateral 01/25/2022   Procedure: ABDOMINAL AORTOGRAM W/LOWER EXTREMITY;  Surgeon: Margherita Shell, MD;  Location: [facility];  Service: Cardiovascular;  Laterality: Bilateral;   ABDOMINAL AORTOGRAM W/LOWER EXTREMITY Right 08/04/2023   Procedure: ABDOMINAL AORTOGRAM W/LOWER EXTREMITY;  Surgeon: Edgardo Goodwill,  Heber Little, MD;  Location: [facility];  Service: Cardiovascular;  Laterality: Right;   AMPUTATION Right 02/16/2022   Procedure: RIGHT FOOT 5TH RAY AMPUTATION;  Surgeon: Timothy Ford, MD;  Location: [facility] OR;  Service: Orthopedics;  Laterality: Right;   ANKLE FRACTURE SURGERY Right    AV FISTULA PLACEMENT Left 08/18/2020   Procedure: LEFT ARM BRACHIOCEPHALIC ARTERIOVENOUS (AV) FISTULA CREATION;  Surgeon: Richrd Char, MD;  Location: [facility] OR;  Service: Vascular;  Laterality: Left;   BIOPSY  04/24/2021   Procedure:  BIOPSY;  Surgeon: Vinetta Greening, DO;  [facility]: [facility];  Service: Endoscopy;;   CESAREAN SECTION     CHOLECYSTECTOMY     COLONOSCOPY  04/24/2021   Surgeon: Vinetta Greening, DO;  nonbleeding internal hemorrhoids, 1 large (25 mm) pedunculated transverse colon polyp (prolapse type polyp) with adherent clot and stigmata of recent bleed.   COLONOSCOPY WITH PROPOFOL N/A 05/14/2021   Procedure: COLONOSCOPY WITH PROPOFOL;  Surgeon: Suzette Espy, MD;  [facility]: [facility];  Service: Endoscopy;  Laterality: N/A;   COLONOSCOPY WITH PROPOFOL N/A 03/17/2023   Procedure: COLONOSCOPY WITH PROPOFOL;  Surgeon: Suzette Espy, MD;  [facility]: [facility];  Service: Endoscopy;  Laterality: N/A;   ESOPHAGOGASTRODUODENOSCOPY (EGD) WITH PROPOFOL N/A 04/24/2021   Surgeon: Vinetta Greening, DO;  duodenal erosions and gastritis biopsied (pathology with peptic duodenitis, reactive gastropathy with erosions/chronic inflammation, negative for H. pylori)   EYE SURGERY     Vatrectomy   HEMOSTASIS CLIP PLACEMENT  05/14/2021   Procedure: HEMOSTASIS CLIP PLACEMENT;  Surgeon: Suzette Espy, MD;  [facility];  Service: Endoscopy;;   IR PERC TUN PERIT CATH WO PORT S&I /IMAG  09/15/2020   IR REMOVAL TUN CV CATH W/O FL  02/19/2021   IR US  GUIDE VASC ACCESS RIGHT  09/15/2020   POLYPECTOMY  04/24/2021   Procedure: POLYPECTOMY;  Surgeon: Vinetta Greening, DO;  [facility];  Service: Endoscopy;;   POLYPECTOMY  05/14/2021   Procedure: POLYPECTOMY;  Surgeon: Suzette Espy, MD;  [facility];  Service: Endoscopy;;   POLYPECTOMY  03/17/2023   Procedure: POLYPECTOMY;  Surgeon: Suzette Espy, MD;  [facility];  Service: Endoscopy;;   SKIN SPLIT GRAFT Bilateral 09/03/2021   Procedure: SKIN GRAFT BILATERAL LEGS;  Surgeon: Timothy Ford, MD;  Location: [facility] OR;  Service: Orthopedics;  Laterality: Bilateral;   SKIN SPLIT GRAFT Left 12/10/2021   Procedure: IRRIGATION AND  DEBRIDEMENT LEFT CALF, APPLICATION SPLIT THICKNESS SKIN GRAFT;  Surgeon: Timothy Ford, MD;  Location: [facility] OR;  Service: Orthopedics;  Laterality: Left;   TOE SURGERY       reports that she has never smoked. She has never used smokeless tobacco. She reports that she does not drink alcohol and does not use drugs.  Allergies  Allergen Reactions   Ace Inhibitors Cough    Family History  Problem Relation Age of Onset   Heart failure Mother    Heart disease Mother    Diabetes Mother    Kidney disease Mother    Heart failure Father    Diabetes Father    Heart disease Father    Diabetes Brother    Heart failure Maternal Grandmother    Heart failure Maternal Grandfather    Transient ischemic attack Maternal Grandfather    Colon cancer Neg Hx     Prior to Admission medications   Medication Sig Start Date End Date Taking?  Authorizing Provider  acidophilus (RISAQUAD) CAPS capsule Take 2 capsules by mouth 3 (three) times daily. Patient taking differently: Take 1 capsule by mouth daily. 02/18/24  Yes Vassie Loll, MD  atorvastatin (LIPITOR) 10 MG tablet Take 10 mg by mouth every evening.   Yes [provider]  AURYXIA 1 GM 210 MG(Fe) tablet Take 630 mg by mouth 3 (three) times daily with meals. 01/07/22  Yes [provider]  diphenoxylate-atropine (LOMOTIL) 2.5-0.025 MG tablet Take 2 tablets by mouth every 8 (eight) hours as needed for diarrhea or loose stools. 02/18/24  Yes Vassie Loll, MD  doxycycline (VIBRA-TABS) 100 MG tablet Take 1 tablet (100 mg total) by mouth 2 (two) times daily. 02/27/24  Yes Nadara Mustard, MD  HUMALOG KWIKPEN 100 UNIT/ML KwikPen Inject 5-11 Units into the skin 3 (three) times daily with meals. If eats 50% or more of meal. Patient taking differently: Inject 5-11 Units into the skin 3 (three) times daily with meals. If eats 50% or more of meal. Sliding scale 03/16/22  Yes Nida, Denman George, MD  insulin glargine (LANTUS) 100 UNIT/ML injection  Inject 0.08 mLs (8 Units total) into the skin at bedtime. 01/19/24  Yes Ghimire, Werner Lean, MD  levothyroxine (SYNTHROID) 75 MCG tablet Take 75 mcg by mouth daily. 06/13/22  Yes [provider]  midodrine (PROAMATINE) 10 MG tablet Take 1 tablet (10 mg total) by mouth 3 (three) times daily. 01/19/24  Yes Ghimire, Werner Lean, MD  vancomycin (VANCOCIN) 125 MG capsule Take 1 capsule (125mg ) by mouth 3 times daily AND at BEDTIME-until 3/10, then 1 capsule (125 mg) 3 times daily for 7 days, then 1 capsule (125mg ) once daily for 7 days, then 1 capsule (125mg ) every OTHER day for 28 days, then 1 capsule (125mg ) EVERY 3 DAYS for 28 days. Patient taking differently: Take 125 mg by mouth See admin instructions. Take 1 capsule (125mg ) by mouth 3 times daily AND at BEDTIME-until 3/10, then 1 capsule (125 mg) 3 times daily for 7 days, then 1 capsule (125mg ) once daily for 7 days, then 1 capsule (125mg ) every OTHER day for 28 days, then 1 capsule (125mg ) EVERY 3 DAYS for 28 days. 01/19/24  Yes Ghimire, Werner Lean, MD  Vitamin D, Ergocalciferol, (DRISDOL) 1.25 MG (50000 UNIT) CAPS capsule Take 50,000 Units by mouth every 7 (seven) days. Friday 04/20/21  Yes [provider]  cholestyramine (QUESTRAN) 4 g packet Take 1 packet (4 g total) by mouth 2 (two) times daily. Mix with 4-6 oz liquid.  Take other meds 1 hr before or 4-6 hr after cholestyramine. Patient not taking: Reported on 03/01/2024 02/18/24 08/16/24  Vassie Loll, MD  famotidine (PEPCID) 20 MG tablet Take 1 tablet (20 mg total) by mouth daily. Patient not taking: Reported on 03/01/2024 02/18/24 02/17/25  Vassie Loll, MD  ondansetron (ZOFRAN-ODT) 4 MG disintegrating tablet Take 1 tablet (4 mg total) by mouth every 8 (eight) hours as needed for nausea or vomiting. Patient not taking: Reported on 03/01/2024 02/21/24   Gelene Mink, NP     Physical Exam: Vitals:   03/01/24 1753 03/01/24 1755  BP: 116/79   Pulse: 72   Resp: 20   Temp: (!) 97.5 F  (36.4 C)   SpO2: 94%   Weight:  90.2 kg  Height:  5\' 6"  (1.676 m)    Physical Exam Vitals and nursing note reviewed.  Constitutional:      Appearance: She is not ill-appearing.  HENT:     Mouth/Throat:  Mouth: Mucous membranes are dry.  Eyes:     Pupils: Pupils are equal, round, and reactive to light.  Cardiovascular:     Rate and Rhythm: Normal rate and regular rhythm.     Pulses: Normal pulses.     Heart sounds: Normal heart sounds.  Pulmonary:     Effort: Pulmonary effort is normal.     Breath sounds: Normal breath sounds.  Abdominal:     General: Bowel sounds are normal.  Musculoskeletal:        General: Swelling present. No signs of injury.     Cervical back: Neck supple.     Right lower leg: Edema present.     Left lower leg: Edema present.     Comments: Right-sided ankle nonhealing ulcer.  Erythema around the ankle.  Skin:    General: Skin is dry.     Capillary Refill: Capillary refill takes less than 2 seconds.  Neurological:     Mental Status: She is alert and oriented to person, place, and time.  Psychiatric:        Mood and Affect: Mood normal.      Labs on Admission: I have personally reviewed following labs and imaging studies  CBC:  Media Information   Document Information  Photos    03/02/2024 02:31  Attached To:  Hospital Encounter on 03/01/24  Source Information  Jetty Mort, MD  [facility]  Document History      Media Information   Document Information  Photos    03/02/2024 02:31  Attached To:  Hospital Encounter on 03/01/24  Source Information  Jetty Mort, MD  [facility]  Document History     Recent Labs  Lab 03/01/24 1804  WBC 5.4  HGB 11.7*  HCT 38.4  MCV 93.4  PLT 80*   Basic Metabolic Panel: Recent Labs  Lab 03/01/24 1804  NA 139  K 4.1  CL 98  CO2 28  GLUCOSE 81  BUN 25*  CREATININE 5.95*  CALCIUM 9.3   GFR: Estimated Creatinine Clearance: 12.8 mL/min (A) (by C-G  formula based on SCr of 5.95 mg/dL (H)). Liver Function Tests: Recent Labs  Lab 03/01/24 1804  AST 20  ALT 18  ALKPHOS 80  BILITOT 1.0  PROT 6.9  ALBUMIN 4.0   No results for input(s): "LIPASE", "AMYLASE" in the last 168 hours. No results for input(s): "AMMONIA" in the last 168 hours. Coagulation Profile: No results for input(s): "INR", "PROTIME" in the last 168 hours. Cardiac Enzymes: No results for input(s): "CKTOTAL", "CKMB", "CKMBINDEX", "TROPONINI", "TROPONINIHS" in the last 168 hours. BNP (last 3 results) No results for input(s): "BNP" in the last 8760 hours. HbA1C: No results for input(s): "HGBA1C" in the last 72 hours. CBG: No results for input(s): "GLUCAP" in the last 168 hours. Lipid Profile: No results for input(s): "CHOL", "HDL", "LDLCALC", "TRIG", "CHOLHDL", "LDLDIRECT" in the last 72 hours. Thyroid Function Tests: No results for input(s): "TSH", "T4TOTAL", "FREET4", "T3FREE", "THYROIDAB" in the last 72 hours. Anemia Panel: No results for input(s): "VITAMINB12", "FOLATE", "FERRITIN", "TIBC", "IRON", "RETICCTPCT" in the last 72 hours. Urine analysis: No results found for: "COLORURINE", "APPEARANCEUR", "LABSPEC", "PHURINE", "GLUCOSEU", "HGBUR", "BILIRUBINUR", "KETONESUR", "PROTEINUR", "UROBILINOGEN", "NITRITE", "LEUKOCYTESUR"  Radiological Exams on Admission: I have personally reviewed images DG Foot Complete Right Result Date: 03/01/2024 CLINICAL DATA:  ulcer EXAM: RIGHT FOOT COMPLETE - 3+ VIEW COMPARISON:  X-ray right foot 01/13/2024 FINDINGS: Prior fifth digit amputation. There is no evidence of fracture or dislocation. Old healed metatarsal fractures. No  cortical erosion or destruction. Severe degenerative changes and hallux valgus of the first digit metatarsophalangeal joint. Dorsal foot subcutaneus soft tissue edema. Limited evaluation of the heel due to overlying bandage/cast. Vascular calcifications. Partially visualized plate and screw fixation of the distal  tibia and fibula. IMPRESSION: 1. Limited evaluation of the heel due to overlying bandage/cast. Recommend repeat dedicated calcaneal radiographs. 2. Otherwise no findings of acute osteomyelitis. 3.  No acute displaced fracture or dislocation. Electronically Signed   By: Morgane  Naveau M.D.   On: 03/01/2024 19:56       Assessment/Plan: Principal Problem:   Cellulitis of right ankle Active Problems:   Peripheral vascular disease (HCC)   Nonhealing ulcer of heel (HCC)   Insulin dependent type 2 diabetes mellitus (HCC)   Acquired hypothyroidism   ESRD on dialysis (HCC)   Chronic diastolic CHF (congestive heart failure) (HCC)   Anemia of chronic disease   Calciphylaxis   Calciphylaxis of right lower extremity with nonhealing ulcer, limited to breakdown of skin (HCC)   Thrombocytopenia (HCC)   Chronic hypotension   Recurrent Clostridioides difficile infection    Assessment and Plan: Right-sided ankle cellulitis Chronic nonhealing pressure ulcer of right ankle Calciphylaxis of the right lower extremity with nonhealing ulcer Peripheral vascular disease -Patient has been referred from the orthopedics clinic for evaluation for the right side ankle cellulitis.  Patient reported that during the dressing changes noticed some bleeding.  Physical exam showing extensive calcification, calciphylaxis, nonhealing pressure ulcer of the ankle.  Picture is on the chart for review. - Patient is afebrile.  No leukocytosis.  X-ray of the right foot no evidence of osteomyelitis. -Given patient is worsening nonhealing ulcer of the right lower extremity starting IV vancomycin.  Blood cultures are pending. -Consulted wound care.  ESRD on HD TTS schedule -ESRD on dialysis TTS schedule.  Consulted nephrology and inform Dr. Jearldine Mina.  Chronic diastolic heart failure Chronic hypotension on midodrine -No evidence of volume overload.  Continue midodrine 10 mg 3 times daily.  Thrombocytopenia -Low platelet  count 80.  Continue to monitor for bleeding.  Recurrent C. difficile associated colitis -Continue vancomycin taper dose to complete until 4/27  Anemia of chronic disease -Stable H&H.  Continue oral iron supplement.  Insulin-dependent DM type II - Continue Semglee and sliding scale insulin with mealtime coverage.  Hypothyroidism - Continue levothyroxine  Hyperlipidemia - Continue Lipitor   DVT prophylaxis:  SQ Heparin Code Status:  Full Code Diet: Renal and carb modified diet Family Communication:   Family was present at bedside, at the time of interview. Opportunity was given to ask question and all questions were answered satisfactorily.  Disposition Plan: Based on the blood culture result can de-escalate antibiotic. Consults: Wound care Admission status:   Inpatient, Telemetry bed  Severity of Illness: The appropriate patient status for this patient is INPATIENT. Inpatient status is judged to be reasonable and necessary in order to provide the required intensity of service to ensure the patient's safety. The patient's presenting symptoms, physical exam findings, and initial radiographic and laboratory data in the context of their chronic comorbidities is felt to place them at high risk for further clinical deterioration. Furthermore, it is not anticipated that the patient will be medically stable for discharge from the hospital within 2 midnights of admission.   * I certify that at the point of admission it is my clinical judgment that the patient will require inpatient hospital care spanning beyond 2 midnights from the point of admission due to high intensity  of service, high risk for further deterioration and high frequency of surveillance required.Aaron Aas    Shawndell Varas, MD [facility]  How to contact the [facility] Attending or Consulting provider 7A - 7P or covering provider during after hours 7P -7A, for this patient.  Check the care team in [facility] and look for a)  attending/consulting [facility] provider listed and b) the [facility] team listed Log into [email or web address] and use [place]'s universal password to access. If you do not have the password, please contact the hospital operator. Locate the [facility] provider you are looking for under [facility] and page to a number that you can be directly reached. If you still have difficulty reaching the provider, please page the [facility] (Director on Call) for the Hospitalists listed on amion for assistance.  03/02/2024, 2:45 AM

## 2024-03-02 NOTE — ED Provider Notes (Signed)
 [facility] Emergency Department Provider Note MRN:  [id number]  Arrival date & time: 03/02/24     Chief Complaint   Foot Ulcer   History of Present Illness   Amy Moses is a 53 y.o. year-old female presents to the ED with chief complaint of right lower extremity cellulitis.  She states that she has a chronic heel wound and peripheral vascular disease.  She was seen recently by Dr. Julio Ohm, and was told to return to the emergency department if she had any new or worsening symptoms.  She states that she was started on doxycycline.  She reports that over the past couple of days she has had increasing redness to the ankle and anterior lower leg.  She denies fevers or chills.  She states that today when she had the wound care nurse changed her dressing, she had significant bleeding from her heel ulcer.  Bleeding is now controlled.  Denies any other new symptoms.   Patient is also end-stage renal disease and is on dialysis and is due for dialysis in the morning. History provided by patient.   Review of Systems  Pertinent positive and negative review of systems noted in HPI.    Physical Exam   Vitals:   03/01/24 1753  BP: 116/79  Pulse: 72  Resp: 20  Temp: (!) 97.5 F (36.4 C)  SpO2: 94%    CONSTITUTIONAL:  non toxic-appearing, NAD NEURO:  Alert and oriented x 3, CN 3-12 grossly intact EYES:  eyes equal and reactive ENT/NECK:  Supple, no stridor  CARDIO:  normal rate, regular rhythm, appears well-perfused  PULM:  No respiratory distress, CTAB GI/GU:  non-distended, no focal tenderness MSK/SPINE:  No gross deformities, no edema, moves all extremities  SKIN: dry skin with some reportedly new erythema of the shin, there is a heel ulcer that has some green/yellow discharge, no active bleeding   *Additional and/or pertinent findings included in MDM below  Diagnostic and Interventional Summary    EKG Interpretation Date/Time:    Ventricular Rate:     PR Interval:    QRS Duration:    QT Interval:    QTC Calculation:   R Axis:      Text Interpretation:         Labs Reviewed  COMPREHENSIVE METABOLIC PANEL WITH GFR - Abnormal; Notable for the following components:      Result Value   BUN 25 (*)    Creatinine, Ser 5.95 (*)    GFR, Estimated 8 (*)    All other components within normal limits  CBC - Abnormal; Notable for the following components:   Hemoglobin 11.7 (*)    Platelets 80 (*)    All other components within normal limits    DG Foot Complete Right  Final Result      Medications  vancomycin (VANCOREADY) IVPB 2000 mg/400 mL (has no administration in time range)     Procedures  /  Critical Care Procedures  ED Course and Medical Decision Making  I have reviewed the triage vital signs, the nursing notes, and pertinent available records from the EMR.  Social Determinants Affecting Complexity of Care: Patient has no clinically significant social determinants affecting this chief complaint..   ED Course:    Medical Decision Making Patient here with a chronic heel ulcer.  She also has peripheral vascular disease.  She reports new discharge and drainage from the ulcer along with bleeding.  She also reports new redness of the lower leg about the  ankle.  She does have normal movement of the ankle, I doubt septic arthritis.  She has been taking doxycycline on an outpatient basis.  However, given that she is having new and worsening redness of the leg, I think that it is reasonable for her to be admitted for IV antibiotics and observation.  She may need to have repeat consultation with Dr. Julio Ohm.  She is due for dialysis tomorrow.  Amount and/or Complexity of Data Reviewed Labs: ordered.  Risk Prescription drug management. Decision regarding hospitalization.         Consultants: I consulted with Hospitalist, Dr. Sundil, who is appreciated for admitting.   Treatment and Plan: Patient's exam and diagnostic  results are concerning for heel ulcer with cellulitis.  Feel that patient will need admission to the hospital for further treatment and evaluation.    Final Clinical Impressions(s) / ED Diagnoses     ICD-10-CM   1. Cellulitis of foot  L03.119       ED Discharge Orders     None         Discharge Instructions Discussed with and Provided to Patient:   Discharge Instructions   None      Sherel Dikes, PA-C 03/02/24 0217    Teddi Favors, DO 03/03/24 [phone]

## 2024-03-02 NOTE — Consult Note (Signed)
 [facility] Renal Consultation Note    Indication for Consultation:  Management of ESRD/hemodialysis, anemia, hypertension/volume, and secondary hyperparathyroidism.  HPI: Amy Moses is a 53 y.o. female with PMH including ESRD on dialysis TTS, recent c diff infection, HTN, pulmonary HTN, CHF and T2DM who presented to [facility] with nonhealing foot ulcer. She is followed by Dr. Julio Ohm outpatient and reproted he advise her to come to the ED for treatment of bleeding right foot ulcer. X-ray of the foot showed no evidence of osteomyelitis but there was concern for cellulitis on exam. Pt denies fever, chills. Reports she started having diarrhea last night, did have a c.diff infection recently but reports her last test was negative. Had some nausea this AM but now resolved. She reports hypotension for the past week, was supposed to be taking midodrine TID but was not always taking it because BP was not low. Reports she did attend HD on Thursday but had limited UF due to hypotension, and was below her EDW. Denies SOB, orthopnea, CP, palpitations, dizziness, edema, abdominal pain. No issues with AVF. She attends dialysis at [name], [place]. Labs today notable for K+ 4.0, BUN 28, Cr 6.43, Ca 9.4, WBC 5.3, Hgb 11.5, Plt 80.   Past Medical History:  Diagnosis Date   Anemia    Blindness of right eye with low vision in contralateral eye    s/p victrectomy   Chronic diastolic heart failure (HCC) 03/30/2021   Diabetes mellitus, type II (HCC)    Dyslipidemia    ESRD on hemodialysis (HCC)    TTS in [place] , [place]   Glaucoma    History of blood transfusion    Hypertension    Hypothyroidism (acquired)    Mitral regurgitation 11/16/2022   Pneumonia    Pulmonary hypertension, unspecified (HCC) 09/12/2022   Past Surgical History:  Procedure Laterality Date   ABDOMINAL AORTOGRAM W/LOWER EXTREMITY Bilateral 12/18/2020   Procedure: ABDOMINAL AORTOGRAM W/LOWER EXTREMITY;  Surgeon: Richrd Char, MD;  [facility];  Service: Cardiovascular;  Laterality: Bilateral;   ABDOMINAL AORTOGRAM W/LOWER EXTREMITY Bilateral 01/25/2022   Procedure: ABDOMINAL AORTOGRAM W/LOWER EXTREMITY;  Surgeon: Margherita Shell, MD;  [facility];  Service: Cardiovascular;  Laterality: Bilateral;   ABDOMINAL AORTOGRAM W/LOWER EXTREMITY Right 08/04/2023   Procedure: ABDOMINAL AORTOGRAM W/LOWER EXTREMITY;  Surgeon: Carlene Che, MD;  [facility];  Service: Cardiovascular;  Laterality: Right;   AMPUTATION Right 02/16/2022   Procedure: RIGHT FOOT 5TH RAY AMPUTATION;  Surgeon: Timothy Ford, MD;  Location: [facility] OR;  Service: Orthopedics;  Laterality: Right;   ANKLE FRACTURE SURGERY Right    AV FISTULA PLACEMENT Left 08/18/2020   Procedure: LEFT ARM BRACHIOCEPHALIC ARTERIOVENOUS (AV) FISTULA CREATION;  Surgeon: Richrd Char, MD;  Location: [facility] OR;  Service: Vascular;  Laterality: Left;   BIOPSY  04/24/2021   Procedure: BIOPSY;  Surgeon: Vinetta Greening, DO;  [facility];  Service: Endoscopy;;   CESAREAN SECTION     CHOLECYSTECTOMY     COLONOSCOPY  04/24/2021   Surgeon: Vinetta Greening, DO;  nonbleeding internal hemorrhoids, 1 large (25 mm) pedunculated transverse colon polyp (prolapse type polyp) with adherent clot and stigmata of recent bleed.   COLONOSCOPY WITH PROPOFOL N/A 05/14/2021   Procedure: COLONOSCOPY WITH PROPOFOL;  Surgeon: Suzette Espy, MD;  [facility];  Service: Endoscopy;  Laterality: N/A;   COLONOSCOPY WITH PROPOFOL N/A 03/17/2023   Procedure: COLONOSCOPY WITH PROPOFOL;  Surgeon: Suzette Espy, MD;  [facility];  Service: Endoscopy;  Laterality: N/A;   ESOPHAGOGASTRODUODENOSCOPY (EGD) WITH PROPOFOL N/A 04/24/2021   Surgeon: Vinetta Greening, DO;  duodenal erosions and gastritis biopsied (pathology with peptic duodenitis, reactive gastropathy with erosions/chronic inflammation, negative for H.  pylori)   EYE SURGERY     Vatrectomy   HEMOSTASIS CLIP PLACEMENT  05/14/2021   Procedure: HEMOSTASIS CLIP PLACEMENT;  Surgeon: Suzette Espy, MD;  [facility];  Service: Endoscopy;;   IR PERC TUN PERIT CATH WO PORT S&I /IMAG  09/15/2020   IR REMOVAL TUN CV CATH W/O FL  02/19/2021   IR US  GUIDE VASC ACCESS RIGHT  09/15/2020   POLYPECTOMY  04/24/2021   Procedure: POLYPECTOMY;  Surgeon: Vinetta Greening, DO;  [facility];  Service: Endoscopy;;   POLYPECTOMY  05/14/2021   Procedure: POLYPECTOMY;  Surgeon: Suzette Espy, MD;  [facility];  Service: Endoscopy;;   POLYPECTOMY  03/17/2023   Procedure: POLYPECTOMY;  Surgeon: Suzette Espy, MD;  [facility];  Service: Endoscopy;;   SKIN SPLIT GRAFT Bilateral 09/03/2021   Procedure: SKIN GRAFT BILATERAL LEGS;  Surgeon: Timothy Ford, MD;  Location: [name] OR;  Service: Orthopedics;  Laterality: Bilateral;   SKIN SPLIT GRAFT Left 12/10/2021   Procedure: IRRIGATION AND DEBRIDEMENT LEFT CALF, APPLICATION SPLIT THICKNESS SKIN GRAFT;  Surgeon: Timothy Ford, MD;  Location[facility];  Service: Orthopedics;  Laterality: Left;   TOE SURGERY     Family History  Problem Relation Age of Onset   Heart failure Mother    Heart disease Mother    Diabetes Mother    Kidney disease Mother    Heart failure Father    Diabetes Father    Heart disease Father    Diabetes Brother    Heart failure Maternal Grandmother    Heart failure Maternal Grandfather    Transient ischemic attack Maternal Grandfather    Colon cancer Neg Hx    Social History:  reports that she has never smoked. She has never used smokeless tobacco. She reports that she does not drink alcohol and does not use drugs.  ROS: As per HPI otherwise negative.  Physical Exam: Vitals:   03/02/24 0236 03/02/24 0237 03/02/24 0334 03/02/24 0904  BP: 101/73  104/65 117/70  Pulse:  66 70 70  Resp:    20  Temp:   97.6 F (36.4 C)   TempSrc:   Oral    SpO2:  96% 99% 98%  Weight:      Height:         General: Well developed, well nourished, in no acute distress. Head: Normocephalic, atraumatic, sclera non-icteric, mucus membranes are moist. Neck: Supple without lymphadenopathy/masses. JVD not elevated. Lungs: Clear bilaterally to auscultation without wheezes, rales, or rhonchi. Breathing is unlabored. Heart: RRR with normal S1, S2. No murmurs, rubs, or gallops appreciated. Abdomen: Soft, non-tender, non-distended with normoactive bowel sounds.  Musculoskeletal:  Strength and tone appear normal for age. Lower extremities: foot wrapped, no edema appreciated Neuro: Alert and oriented X 3. Moves all extremities spontaneously. Psych:  Responds to questions appropriately with a normal affect. Dialysis Access: AVF + thrill/bruit  Allergies  Allergen Reactions   Ace Inhibitors Cough   Prior to Admission medications   Medication Sig Start Date End Date Taking? Authorizing Provider  acidophilus (RISAQUAD) CAPS capsule Take 2 capsules by mouth 3 (three) times daily. Patient taking differently: Take 1 capsule  by mouth daily. 02/18/24  Yes Vassie Loll, MD  atorvastatin (LIPITOR) 10 MG tablet Take 10 mg by mouth every evening.   Yes [provider]  AURYXIA 1 GM 210 MG(Fe) tablet Take 630 mg by mouth 3 (three) times daily with meals. 01/07/22  Yes [provider]  diphenoxylate-atropine (LOMOTIL) 2.5-0.025 MG tablet Take 2 tablets by mouth every 8 (eight) hours as needed for diarrhea or loose stools. 02/18/24  Yes Vassie Loll, MD  doxycycline (VIBRA-TABS) 100 MG tablet Take 1 tablet (100 mg total) by mouth 2 (two) times daily. 02/27/24  Yes Nadara Mustard, MD  HUMALOG KWIKPEN 100 UNIT/ML KwikPen Inject 5-11 Units into the skin 3 (three) times daily with meals. If eats 50% or more of meal. Patient taking differently: Inject 5-11 Units into the skin 3 (three) times daily with meals. If eats 50% or more of meal. Sliding scale  03/16/22  Yes Nida, Denman George, MD  insulin glargine (LANTUS) 100 UNIT/ML injection Inject 0.08 mLs (8 Units total) into the skin at bedtime. 01/19/24  Yes Ghimire, Werner Lean, MD  levothyroxine (SYNTHROID) 75 MCG tablet Take 75 mcg by mouth daily. 06/13/22  Yes [provider]  midodrine (PROAMATINE) 10 MG tablet Take 1 tablet (10 mg total) by mouth 3 (three) times daily. 01/19/24  Yes Ghimire, Werner Lean, MD  vancomycin (VANCOCIN) 125 MG capsule Take 1 capsule (125mg ) by mouth 3 times daily AND at BEDTIME-until 3/10, then 1 capsule (125 mg) 3 times daily for 7 days, then 1 capsule (125mg ) once daily for 7 days, then 1 capsule (125mg ) every OTHER day for 28 days, then 1 capsule (125mg ) EVERY 3 DAYS for 28 days. Patient taking differently: Take 125 mg by mouth See admin instructions. Take 1 capsule (125mg ) by mouth 3 times daily AND at BEDTIME-until 3/10, then 1 capsule (125 mg) 3 times daily for 7 days, then 1 capsule (125mg ) once daily for 7 days, then 1 capsule (125mg ) every OTHER day for 28 days, then 1 capsule (125mg ) EVERY 3 DAYS for 28 days. 01/19/24  Yes Ghimire, Werner Lean, MD  Vitamin D, Ergocalciferol, (DRISDOL) 1.25 MG (50000 UNIT) CAPS capsule Take 50,000 Units by mouth every 7 (seven) days. Friday 04/20/21  Yes [provider]  cholestyramine (QUESTRAN) 4 g packet Take 1 packet (4 g total) by mouth 2 (two) times daily. Mix with 4-6 oz liquid.  Take other meds 1 hr before or 4-6 hr after cholestyramine. Patient not taking: Reported on 03/01/2024 02/18/24 08/16/24  Vassie Loll, MD  famotidine (PEPCID) 20 MG tablet Take 1 tablet (20 mg total) by mouth daily. Patient not taking: Reported on 03/01/2024 02/18/24 02/17/25  Vassie Loll, MD  ondansetron (ZOFRAN-ODT) 4 MG disintegrating tablet Take 1 tablet (4 mg total) by mouth every 8 (eight) hours as needed for nausea or vomiting. Patient not taking: Reported on 03/01/2024 02/21/24   Gelene Mink, NP   Current Facility-Administered  Medications  Medication Dose Route Frequency Provider Last Rate Last Admin   0.9 %  sodium chloride infusion  250 mL Intravenous PRN Janalyn Shy, Subrina, MD       acetaminophen (TYLENOL) tablet 650 mg  650 mg Oral Q6H PRN Janalyn Shy, Subrina, MD       Or   acetaminophen (TYLENOL) suppository 650 mg  650 mg Rectal Q6H PRN Janalyn Shy, Subrina, MD       atorvastatin (LIPITOR) tablet 10 mg  10 mg Oral QPM Janalyn Shy, Subrina, MD       diphenoxylate-atropine (  LOMOTIL) 2.5-0.025 MG per tablet 2 tablet  2 tablet Oral Q8H PRN Sundil, Subrina, MD   2 tablet at 03/02/24 0404   ferric citrate (AURYXIA) tablet 630 mg  630 mg Oral TID WC Sundil, Subrina, MD       heparin injection 5,000 Units  5,000 Units Subcutaneous Q8H Sundil, Subrina, MD   5,000 Units at 03/02/24 0626   insulin aspart (novoLOG) injection 0-5 Units  0-5 Units Subcutaneous QHS Sundil, Subrina, MD       insulin aspart (novoLOG) injection 0-6 Units  0-6 Units Subcutaneous TID WC Sundil, Subrina, MD   1 Units at 03/02/24 0843   insulin glargine-yfgn (SEMGLEE) injection 8 Units  8 Units Subcutaneous QHS Sundil, Subrina, MD       levothyroxine (SYNTHROID) tablet 75 mcg  75 mcg Oral Q0600 Sundil, Subrina, MD   75 mcg at 03/02/24 0626   midodrine (PROAMATINE) tablet 10 mg  10 mg Oral TID WC Sundil, Subrina, MD   10 mg at 03/02/24 0843   ondansetron (ZOFRAN) tablet 4 mg  4 mg Oral Q6H PRN Sundil, Subrina, MD       Or   ondansetron (ZOFRAN) injection 4 mg  4 mg Intravenous Q6H PRN Sundil, Subrina, MD       sodium chloride flush (NS) 0.9 % injection 3 mL  3 mL Intravenous Q12H Sundil, Subrina, MD   3 mL at 03/02/24 0846   sodium chloride flush (NS) 0.9 % injection 3 mL  3 mL Intravenous Q12H Sundil, Subrina, MD   3 mL at 03/02/24 0846   sodium chloride flush (NS) 0.9 % injection 3 mL  3 mL Intravenous PRN Sundil, Subrina, MD       [START ON 03/03/2024] vancomycin (VANCOCIN) capsule 125 mg  125 mg Oral QODAY Sundil, Subrina, MD       Followed by   Cecily Cohen ON  03/17/2024] vancomycin (VANCOCIN) capsule 125 mg  125 mg Oral Q3 days Sundil, Subrina, MD       vancomycin (VANCOCIN) IVPB 1000 mg/200 mL premix  1,000 mg Intravenous Q T,Th,Sa-HD [facility], [facility]       Labs: Basic Metabolic Panel: Recent Labs  Lab 03/01/24 1804 03/02/24 0339  NA 139 138  K 4.1 4.0  CL 98 97*  CO2 28 26  GLUCOSE 81 85  BUN 25* 28*  CREATININE 5.95* 6.43*  CALCIUM 9.3 9.4   Liver Function Tests: Recent Labs  Lab 03/01/24 1804 03/02/24 0339  AST 20 17  ALT 18 15  ALKPHOS 80 83  BILITOT 1.0 1.0  PROT 6.9 6.8  ALBUMIN 4.0 4.0   No results for input(s): "LIPASE", "AMYLASE" in the last 168 hours. No results for input(s): "AMMONIA" in the last 168 hours. CBC: Recent Labs  Lab 03/01/24 1804 03/02/24 0339  WBC 5.4 5.3  HGB 11.7* 11.5*  HCT 38.4 37.7  MCV 93.4 92.4  PLT 80* 80*   Cardiac Enzymes: No results for input(s): "CKTOTAL", "CKMB", "CKMBINDEX", "TROPONINI" in the last 168 hours. CBG: Recent Labs  Lab 03/02/24 0349 03/02/24 0746  GLUCAP 84 173*   Iron Studies: No results for input(s): "IRON", "TIBC", "TRANSFERRIN", "FERRITIN" in the last 72 hours. Studies/Results: DG Foot Complete Right Result Date: 03/01/2024 CLINICAL DATA:  ulcer EXAM: RIGHT FOOT COMPLETE - 3+ VIEW COMPARISON:  X-ray right foot 01/13/2024 FINDINGS: Prior fifth digit amputation. There is no evidence of fracture or dislocation. Old healed metatarsal fractures. No cortical erosion or destruction. Severe degenerative changes and hallux valgus of  the first digit metatarsophalangeal joint. Dorsal foot subcutaneus soft tissue edema. Limited evaluation of the heel due to overlying bandage/cast. Vascular calcifications. Partially visualized plate and screw fixation of the distal tibia and fibula. IMPRESSION: 1. Limited evaluation of the heel due to overlying bandage/cast. Recommend repeat dedicated calcaneal radiographs. 2. Otherwise no findings of acute osteomyelitis. 3.  No acute  displaced fracture or dislocation. Electronically Signed   By: Morgane  Naveau M.D.   On: 03/01/2024 19:56    Dialysis Orders:  Requesting updated orders (their system is currently down). From Feb 2025: TTS [name], [place] 3h  91.5kg  2K bath  B400  LUA AVF  Heparin none    Renal-related home meds: - midodrine 10mg   - auryxia 3 ac tid - others: PPI, insulins, vit D, T4, statin   Assessment/Plan:  R foot ulcer/cellulitis: Followed by Dr. Julio Ohm outpatient. On empiric antibiotics. There is concern for calciphylaxis per ortho notes however RN at her current HD unit reports she is not receiving sodium thiosulfate.  Diarrhea: With recent admission for the same, improved today. Management per admitting team  ESRD:  On TTS schedule,  has not missed any dialysis. Will plan for dialysis today.  Hypertension/volume: BP currently stable, continue  midodrine TID. She is euvolemic on exam.   Anemia: Hgb at goal, requesting outpatient ESA records. No IV Fe with acute infection.   Metabolic bone disease: Calcium controlled, follow phos. Continue binders with meals   Nutrition:  renal diet with fluid restrictions  Ramona Burner, PA-C 03/02/2024, 9:14 AM  [facility] Pager: [phone]

## 2024-03-03 DIAGNOSIS — L03115 Cellulitis of right lower limb: Secondary | ICD-10-CM | POA: Diagnosis not present

## 2024-03-03 LAB — GLUCOSE, CAPILLARY
Glucose-Capillary: 199 mg/dL — ABNORMAL HIGH (ref 70–99)
Glucose-Capillary: 150 mg/dL — ABNORMAL HIGH (ref 70–99)
Glucose-Capillary: 212 mg/dL — ABNORMAL HIGH (ref 70–99)
Glucose-Capillary: 156 mg/dL — ABNORMAL HIGH (ref 70–99)
Glucose-Capillary: 194 mg/dL — ABNORMAL HIGH (ref 70–99)

## 2024-03-03 LAB — HEPATITIS B SURFACE ANTIBODY, QUANTITATIVE: Hep B S AB Quant (Post): <3.5 m[IU]/mL — ABNORMAL LOW

## 2024-03-03 MED ORDER — VANCOMYCIN HCL IN DEXTROSE 1-5 GM/200ML-% IV SOLN: 1000.0000 mg | INTRAVENOUS | Status: DC

## 2024-03-03 MED ORDER — VANCOMYCIN HCL 750 MG/150ML IV SOLN: 750.0000 mg | INTRAVENOUS | Status: DC

## 2024-03-03 NOTE — Progress Notes (Signed)
 [facility] Progress Note   Subjective:   Reports doing well today, no concerns. Denies SOB, CP, dizziness, nausea, fever.   Objective Vitals:   03/02/24 1500 03/02/24 1536 03/02/24 1541 03/02/24 2139  BP: 116/64 108/69 120/66 115/68  Pulse: 65 64 64 62  Resp: 15 17 15    Temp:  98.4 F (36.9 C)  98.3 F (36.8 C)  TempSrc:    Oral  SpO2: 97% 99% 90% 100%  Weight:   74.8 kg   Height:       Physical Exam General: Well appearing, alert female in NAD Heart: RRR, no murmurs, rubs or gallops  Lungs:CTA bilaterally ,respirations unlabored Abdomen: Soft, non-distended, +BS Extremities: No edema, L leg wrapped Dialysis Access:  AVF + t/b  Additional Objective Labs: Basic Metabolic Panel: Recent Labs  Lab 03/01/24 1804 03/02/24 0339  NA 139 138  K 4.1 4.0  CL 98 97*  CO2 28 26  GLUCOSE 81 85  BUN 25* 28*  CREATININE 5.95* 6.43*  CALCIUM 9.3 9.4   Liver Function Tests: Recent Labs  Lab 03/01/24 1804 03/02/24 0339  AST 20 17  ALT 18 15  ALKPHOS 80 83  BILITOT 1.0 1.0  PROT 6.9 6.8  ALBUMIN 4.0 4.0   No results for input(s): "LIPASE", "AMYLASE" in the last 168 hours. CBC: Recent Labs  Lab 03/01/24 1804 03/02/24 0339  WBC 5.4 5.3  HGB 11.7* 11.5*  HCT 38.4 37.7  MCV 93.4 92.4  PLT 80* 80*   Blood Culture    Component Value Date/Time   SDES BLOOD RIGHT ANTECUBITAL 03/02/2024 0339   SPECREQUEST  03/02/2024 0339    BOTTLES DRAWN AEROBIC AND ANAEROBIC Blood Culture adequate volume   CULT  03/02/2024 0339    NO GROWTH 1 DAY Performed at [facility] [place] [facility], [address], [place], [place] [address]    REPTSTATUS PENDING 03/02/2024 5621    Cardiac Enzymes: No results for input(s): "CKTOTAL", "CKMB", "CKMBINDEX", "TROPONINI" in the last 168 hours. CBG: Recent Labs  Lab 03/02/24 1111 03/02/24 1702 03/02/24 2044 03/03/24 0448 03/03/24 0635  GLUCAP 133* 102* 178* 199* 150*   Iron Studies: No results for input(s): "IRON", "TIBC",  "TRANSFERRIN", "FERRITIN" in the last 72 hours. @lablastinr3 @ Studies/Results: DG Foot Complete Right Result Date: 03/01/2024 CLINICAL DATA:  ulcer EXAM: RIGHT FOOT COMPLETE - 3+ VIEW COMPARISON:  X-ray right foot 01/13/2024 FINDINGS: Prior fifth digit amputation. There is no evidence of fracture or dislocation. Old healed metatarsal fractures. No cortical erosion or destruction. Severe degenerative changes and hallux valgus of the first digit metatarsophalangeal joint. Dorsal foot subcutaneus soft tissue edema. Limited evaluation of the heel due to overlying bandage/cast. Vascular calcifications. Partially visualized plate and screw fixation of the distal tibia and fibula. IMPRESSION: 1. Limited evaluation of the heel due to overlying bandage/cast. Recommend repeat dedicated calcaneal radiographs. 2. Otherwise no findings of acute osteomyelitis. 3.  No acute displaced fracture or dislocation. Electronically Signed   By: Morgane  Naveau M.D.   On: 03/01/2024 19:56   Medications:  cefTRIAXone (ROCEPHIN)  IV 2 g (03/02/24 1819)   vancomycin 1,000 mg (03/02/24 1705)   vancomycin     Followed by   Cecily Cohen ON 03/05/2024] vancomycin      atorvastatin  10 mg Oral QPM   Chlorhexidine Gluconate Cloth  6 each Topical Q0600   ferric citrate  630 mg Oral TID WC   heparin  5,000 Units Subcutaneous Q8H   insulin aspart  0-5 Units Subcutaneous QHS  insulin aspart  0-6 Units Subcutaneous TID WC   insulin glargine-yfgn  8 Units Subcutaneous QHS   levothyroxine  75 mcg Oral Q0600   midodrine  10 mg Oral TID WC   sodium chloride flush  3 mL Intravenous Q12H   sodium chloride flush  3 mL Intravenous Q12H   vancomycin  125 mg Oral QODAY   Followed by   Cecily Cohen ON 03/17/2024] vancomycin  125 mg Oral Q3 days    Dialysis Orders: Requesting updated orders (their system was down Saturday). From Feb 2025: TTS [name], [place] 3h  91.5kg  2K bath  B400  LUA AVF  Heparin none    Renal-related home  meds: - midodrine 10mg   - auryxia 3 ac tid - others: PPI, insulins, vit D, T4, statin   Assessment/Plan:  R foot ulcer/cellulitis: Followed by Dr. Julio Ohm outpatient. On empiric antibiotics. There is concern for calciphylaxis per ortho notes however RN at her current HD unit reports she is not receiving sodium thiosulfate.  Diarrhea: With recent admission for the same, improved today. Management per admitting team  ESRD:  On TTS schedule,  has not missed any dialysis. Next HD Tuesday  Hypertension/volume: BP currently stable, continue  midodrine TID. She is euvolemic on exam.   Anemia: Hgb at goal, requesting outpatient ESA records. No IV Fe with acute infection.   Metabolic bone disease: Calcium controlled, follow phos. Continue binders with meals   Nutrition:  renal diet with fluid restrictions  Ramona Burner, PA-C 03/03/2024, 8:40 AM  [facility] Pager: [phone]

## 2024-03-03 NOTE — Plan of Care (Signed)
   Problem: Nutritional: Goal: Maintenance of adequate nutrition will improve Outcome: Progressing   Problem: Skin Integrity: Goal: Risk for impaired skin integrity will decrease Outcome: Progressing

## 2024-03-03 NOTE — Progress Notes (Signed)
 Pharmacy Antibiotic Note  Amy Moses is a 53 y.o. female admitted on 03/01/2024 with foot ulcer/concerns for cellulitis.  Pharmacy has been consulted for vancomycin dosing. Recently started on doxycycline with increased redness. PMH includes ESRD (HD TTS) and hx of C. Diff colitis w/ persistent diarrhea (last C. Diff labs negative on 3/28) currently finishing Vanc PO taper  Vancomycin 2 g prior to HD, and 1 g post HD. Considering weight, will decrease dose empirically.  Plan: - Vancomycin 750 mg IV qHD TTS - Follow up signs of clinical improvement, LOT, de-escalation of antibiotics    Height: 5\' 6"  (167.6 cm) Weight: 74.8 kg (164 lb 14.5 oz) IBW/kg (Calculated) : 59.3  Temp (24hrs), Avg:98 F (36.7 C), Min:97.6 F (36.4 C), Max:98.4 F (36.9 C)  Recent Labs  Lab 03/01/24 1804 03/02/24 0339  WBC 5.4 5.3  CREATININE 5.95* 6.43*    Estimated Creatinine Clearance: 10.8 mL/min (A) (by C-G formula based on SCr of 6.43 mg/dL (H)).    Allergies  Allergen Reactions   Ace Inhibitors Cough    Antimicrobials this admission: Vancomycin 4/12 >>   Microbiology results: 4/12 BCx:   Thank you for allowing pharmacy to be a part of this patient's care.  Estela Held, PharmD PGY-2 Infectious Diseases Pharmacy Resident [facility] for Infectious Disease 03/03/2024 10:42 AM

## 2024-03-03 NOTE — Progress Notes (Signed)
 Water placed in chamber and device placed at bedside for pt to place herself on/off when ready.

## 2024-03-03 NOTE — Progress Notes (Signed)
 PROGRESS NOTE    Amy Moses  ZOX:[id number] DOB: 1971-01-11 DOA: 03/01/2024 PCP: Dorrine Gaudy, DO  Outpatient Specialists:     Brief Narrative:  As per H&P done on admission: "Amy Moses is a 53 y.o. female with medical history significant of right lower extremity nonhealing ulcer follows orthopedist Dr. Julio Ohm outpatient, peripheral vascular disease, pressure ulcer of the right heel stage II, ESRD on dialysis TTS schedule, chronic hypotension on midodrine, DM type II, hypothyroidism, OSA, thrombocytopenia, chronic diastolic heart failure, and recurrent C. difficile colitis presented to emergency department referred from orthopedics clinic for evaluation of the right sided foot ulcer as it has been started bleeding. Patient denies any fever, chill and lower extremities pain.     ED Course:  On presentation to ED patient is hemodynamically stable. CBC showing stable H&H 11.7 and 38.  Normal WBC count and low platelet count 80. CMP unremarkable except evidence of ESRD.  BUN 25 and creatinine 5.95.   X-ray of the right foot Limited evaluation due to overlying bandage.Recommend repeat dedicated calcaneal radiographs. 2. Otherwise no findings of acute osteomyelitis. 3.  No acute displaced fracture or dislocation.   ED physician reported that during the bandage change did not notice any evidence of bleeding however right-sided for ankle having some redness concern for development of cellulitis.   Hospitalist has been consulted for further evaluation management of chronic diabetic pressure ulcer wound and associated cellulitis".  03/02/2024: Patient seen on hemodialysis.  Patient has required right lower leg cellulitis, significant scaly skin involving right lower leg.  Unna boot on left lower leg.  No new complaints.  03/03/2024: Patient seen.  Cellulitis of right lower extremity is improving.  No constitutional symptoms reported.  Continue antibiotics.  Low threshold to consult Dr.  Julio Ohm.   Assessment & Plan:   Principal Problem:   Cellulitis of right ankle Active Problems:   Insulin dependent type 2 diabetes mellitus (HCC)   Acquired hypothyroidism   Anemia of chronic disease   ESRD on dialysis (HCC)   Calciphylaxis   Chronic diastolic CHF (congestive heart failure) (HCC)   Calciphylaxis of right lower extremity with nonhealing ulcer, limited to breakdown of skin (HCC)   Thrombocytopenia (HCC)   Peripheral vascular disease (HCC)   Nonhealing ulcer of heel (HCC)   Chronic hypotension   Recurrent Clostridioides difficile infection   Right-sided ankle cellulitis Chronic nonhealing pressure ulcer of right ankle Calciphylaxis of the right lower extremity with nonhealing ulcer Peripheral vascular disease -Patient has been referred from the orthopedics clinic for evaluation for the right side ankle cellulitis.  Patient reported that during the dressing changes noticed some bleeding.  Physical exam showing extensive calcification, calciphylaxis, nonhealing pressure ulcer of the ankle.  Picture is on the chart for review. - Patient is afebrile.  No leukocytosis.  X-ray of the right foot no evidence of osteomyelitis. -Given patient is worsening nonhealing ulcer of the right lower extremity starting IV vancomycin.  Blood cultures are pending. -Consulted wound care. 03/02/2024: Continue IV vancomycin.  Add IV Rocephin.  Consider orthopedic surgery consult.   ESRD on HD TTS schedule -ESRD on dialysis TTS schedule.  Consulted nephrology and inform Dr. Jearldine Mina. 03/02/2024: Seen on hemodialysis today.  Appreciate nephrology input.   Chronic diastolic heart failure Chronic hypotension on midodrine -No evidence of volume overload.  Continue midodrine 10 mg 3 times daily.   Thrombocytopenia -Low platelet count 80.  Continue to monitor for bleeding.   Recurrent C. difficile associated colitis -Continue vancomycin taper dose  to complete until 4/27   Anemia of chronic  disease -Stable H&H.  Continue oral iron supplement.   Insulin-dependent DM type II - Continue Semglee and sliding scale insulin with mealtime coverage.   Hypothyroidism - Continue levothyroxine   Hyperlipidemia - Continue Lipitor   DVT prophylaxis: Subcutaneous heparin. Code Status: Full code. Family Communication:  Disposition Plan: Patient remains inpatient.   Consultants:  Nephrology.  Procedures:  Awaiting hemodialysis.  Antimicrobials:  IV vancomycin.   Subjective: No new complaints.  Objective: Vitals:   03/02/24 1541 03/02/24 2139 03/03/24 0849 03/03/24 1643  BP: 120/66 115/68 (!) 101/51 123/68  Pulse: 64 62 (!) 58 66  Resp: 15  18 17   Temp:  98.3 F (36.8 C) 97.6 F (36.4 C) 97.6 F (36.4 C)  TempSrc:  Oral Oral Oral  SpO2: 90% 100% 100% 97%  Weight: 74.8 kg     Height:       No intake or output data in the 24 hours ending 03/03/24 1851  Filed Weights   03/01/24 1755 03/02/24 1135 03/02/24 1541  Weight: 90.2 kg 75.8 kg 74.8 kg    Examination:  General exam: Appears calm and comfortable.  Patient is pale. Respiratory system: Clear to auscultation.  Cardiovascular system: S1 & S2, systolic murmur.  Gastrointestinal system: Abdomen is soft and nontender.. Central nervous system: Awake and alert.  Patient moves extremities.   Extremities: Unna boot to left lower leg.  Cellulitic change right lower leg, with scaly skin  Data Reviewed: I have personally reviewed following labs and imaging studies  CBC: Recent Labs  Lab 03/01/24 1804 03/02/24 0339  WBC 5.4 5.3  HGB 11.7* 11.5*  HCT 38.4 37.7  MCV 93.4 92.4  PLT 80* 80*   Basic Metabolic Panel: Recent Labs  Lab 03/01/24 1804 03/02/24 0339  NA 139 138  K 4.1 4.0  CL 98 97*  CO2 28 26  GLUCOSE 81 85  BUN 25* 28*  CREATININE 5.95* 6.43*  CALCIUM 9.3 9.4   GFR: Estimated Creatinine Clearance: 10.8 mL/min (A) (by C-G formula based on SCr of 6.43 mg/dL (H)). Liver Function  Tests: Recent Labs  Lab 03/01/24 1804 03/02/24 0339  AST 20 17  ALT 18 15  ALKPHOS 80 83  BILITOT 1.0 1.0  PROT 6.9 6.8  ALBUMIN 4.0 4.0   No results for input(s): "LIPASE", "AMYLASE" in the last 168 hours. No results for input(s): "AMMONIA" in the last 168 hours. Coagulation Profile: No results for input(s): "INR", "PROTIME" in the last 168 hours. Cardiac Enzymes: No results for input(s): "CKTOTAL", "CKMB", "CKMBINDEX", "TROPONINI" in the last 168 hours. BNP (last 3 results) No results for input(s): "PROBNP" in the last 8760 hours. HbA1C: No results for input(s): "HGBA1C" in the last 72 hours. CBG: Recent Labs  Lab 03/02/24 2044 03/03/24 0448 03/03/24 0635 03/03/24 1119 03/03/24 1646  GLUCAP 178* 199* 150* 212* 156*   Lipid Profile: No results for input(s): "CHOL", "HDL", "LDLCALC", "TRIG", "CHOLHDL", "LDLDIRECT" in the last 72 hours. Thyroid Function Tests: No results for input(s): "TSH", "T4TOTAL", "FREET4", "T3FREE", "THYROIDAB" in the last 72 hours. Anemia Panel: No results for input(s): "VITAMINB12", "FOLATE", "FERRITIN", "TIBC", "IRON", "RETICCTPCT" in the last 72 hours. Urine analysis: No results found for: "COLORURINE", "APPEARANCEUR", "LABSPEC", "PHURINE", "GLUCOSEU", "HGBUR", "BILIRUBINUR", "KETONESUR", "PROTEINUR", "UROBILINOGEN", "NITRITE", "LEUKOCYTESUR" Sepsis Labs: @LABRCNTIP (procalcitonin:4,lacticidven:4)  ) Recent Results (from the past 240 hours)  Culture, blood (Routine X 2) w Reflex to ID Panel     Status: None (Preliminary result)   Collection  Time: 03/02/24  2:44 AM   Specimen: BLOOD RIGHT FOREARM  Result Value Ref Range Status   Specimen Description BLOOD RIGHT FOREARM  Final   Special Requests   Final    BOTTLES DRAWN AEROBIC AND ANAEROBIC Blood Culture adequate volume   Culture   Final    NO GROWTH 1 DAY Performed at [facility], [address], [place], [place] [address]    Report Status PENDING  Incomplete  Culture, blood  (Routine X 2) w Reflex to ID Panel     Status: None (Preliminary result)   Collection Time: 03/02/24  3:39 AM   Specimen: BLOOD  Result Value Ref Range Status   Specimen Description BLOOD RIGHT ANTECUBITAL  Final   Special Requests   Final    BOTTLES DRAWN AEROBIC AND ANAEROBIC Blood Culture adequate volume   Culture   Final    NO GROWTH 1 DAY Performed at [facility], [address] [place], [place] [address]    Report Status PENDING  Incomplete         Radiology Studies: No results found.       Scheduled Meds:  atorvastatin  10 mg Oral QPM   Chlorhexidine Gluconate Cloth  6 each Topical Q0600   ferric citrate  630 mg Oral TID WC   heparin  5,000 Units Subcutaneous Q8H   insulin aspart  0-5 Units Subcutaneous QHS   insulin aspart  0-6 Units Subcutaneous TID WC   insulin glargine-yfgn  8 Units Subcutaneous QHS   levothyroxine  75 mcg Oral Q0600   midodrine  10 mg Oral TID WC   sodium chloride flush  3 mL Intravenous Q12H   sodium chloride flush  3 mL Intravenous Q12H   vancomycin  125 mg Oral QODAY   Followed by   Cecily Cohen ON 03/17/2024] vancomycin  125 mg Oral Q3 days   Continuous Infusions:  cefTRIAXone (ROCEPHIN)  IV 2 g (03/03/24 1719)   [START ON 03/05/2024] vancomycin       LOS: 1 day    Time spent: 35 minutes.    Fonnie Iba, MD  [facility] Pager #: [phone] 7PM-7AM contact night coverage as above

## 2024-03-04 DIAGNOSIS — L03115 Cellulitis of right lower limb: Secondary | ICD-10-CM | POA: Diagnosis not present

## 2024-03-04 LAB — GLUCOSE, CAPILLARY
Glucose-Capillary: 130 mg/dL — ABNORMAL HIGH (ref 70–99)
Glucose-Capillary: 134 mg/dL — ABNORMAL HIGH (ref 70–99)

## 2024-03-04 MED ORDER — AMOXICILLIN-POT CLAVULANATE 500-125 MG PO TABS: 1.0000 | ORAL_TABLET | Freq: Every evening | ORAL | 0 refills | Status: AC

## 2024-03-04 MED ORDER — POLYETHYLENE GLYCOL 3350 17 G PO PACK: 17.0000 g | PACK | Freq: Every day | ORAL | 0 refills | Status: DC

## 2024-03-04 MED ORDER — VANCOMYCIN HCL 125 MG PO CAPS: ORAL_CAPSULE | ORAL | 0 refills | Status: DC

## 2024-03-04 MED ORDER — CHLORHEXIDINE GLUCONATE CLOTH 2 % EX PADS: 6.0000 | MEDICATED_PAD | Freq: Every day | CUTANEOUS | Status: DC

## 2024-03-04 MED ORDER — SENNOSIDES-DOCUSATE SODIUM 8.6-50 MG PO TABS: 1.0000 | ORAL_TABLET | Freq: Every day | ORAL | 0 refills | Status: AC

## 2024-03-04 MED ORDER — SENNOSIDES-DOCUSATE SODIUM 8.6-50 MG PO TABS: 1.0000 | ORAL_TABLET | Freq: Every day | ORAL | Status: DC

## 2024-03-04 MED ORDER — AMOXICILLIN-POT CLAVULANATE 875-125 MG PO TABS
1.0000 | ORAL_TABLET | Freq: Two times a day (BID) | ORAL | 0 refills | Status: DC
Start: 2024-03-04 — End: 2024-03-04

## 2024-03-04 MED ORDER — ACETAMINOPHEN 325 MG PO TABS: 650.0000 mg | ORAL_TABLET | Freq: Four times a day (QID) | ORAL | Status: DC | PRN

## 2024-03-04 MED ORDER — POLYETHYLENE GLYCOL 3350 17 G PO PACK
17.0000 g | PACK | Freq: Every day | ORAL | Status: DC
  Administered 2024-03-04: 17 g via ORAL
  Filled 2024-03-04: qty 1

## 2024-03-04 NOTE — Progress Notes (Signed)
 [facility] Liaison Note  03/04/2024  Amy Moses 1971-08-11 [id number]  Location: RN Hospital Liaison screened the patient remotely at [facility].  Insurance: Medicare   [name] is a 53 y.o. female who is a Primary Care Patient of Dorrine Gaudy, DO The patient was screened for readmission hospitalization with noted high risk score for unplanned readmission risk with 2 IP in 6 months.  The patient was assessed for potential Care Management service needs for post hospital transition for care coordination. Review of patient's electronic medical record reveals patient Cellulitis. Recent call attempts by [facility] with [facility] unsuccessful. Liaison will collaborate with the previous RNCM concerning pt's readmission.   Plan: [facility] Liaison will continue to follow progress and disposition to asess for post hospital community care coordination/management needs.  Referral request for community care coordination: anticipate Transitions of Care Team follow up.   [facility] Care Management/Population Health does not replace or interfere with any arrangements made by the Inpatient Transition of Care team.   For questions contact:   Lilla Reichert, RN, BSN Hospital Liaison [place]   [facility], Population Health Office Hours MTWF  8:00 am-6:00 pm Direct Dial: [phone] mobile [email or web address]

## 2024-03-04 NOTE — Progress Notes (Signed)
 [facility] Progress Note  Subjective:  Seen in room Sitting up eating breakfast, no c/o's  Vitals:   03/03/24 2100 03/03/24 2133 03/04/24 0512 03/04/24 0836  BP:  116/64 112/66 (!) 105/59  Pulse: 69 65 69 67  Resp: 16 16 17 18   Temp:  98.1 F (36.7 C) 98 F (36.7 C) 97.8 F (36.6 C)  TempSrc:  Oral Oral Oral  SpO2: 98% 100% 97% 99%  Weight:      Height:        Exam: General: Well appearing, alert female in NAD Heart: RRR, no murmurs, rubs or gallops  Lungs:CTA bilaterally ,respirations unlabored Abdomen: Soft, non-distended, +BS Extremities: No edema, L leg wrapped Dialysis Access:  AVF + t/b    OP HD: TTS [name] 3h  91.5kg   2K bath  B400   LUA AVF Heparin none  Renal-related home meds: - midodrine 10mg   - auryxia 3 ac tid - others: PPI, insulins, vit D, T4, statin    Assessment/Plan:  R foot ulcer/cellulitis: Followed by Dr. Julio Ohm outpatient. On empiric antibiotics. There was concern for calciphylaxis per ortho notes but per the pictures does not have the appearance of calciphylaxis.   Diarrhea: With recent admission for the same, improved today. Management per admitting team  ESRD:  HD TTS. Next HD tomorrow.   Hypertension/volume: BP currently stable, continue  midodrine 10 TID. She is euvolemic on exam.   Anemia: Hgb at goal, requesting outpatient ESA records. No IV Fe with acute infection.   Metabolic bone disease: Calcium controlled, follow phos. Continue binders with meals   Nutrition:  renal diet with fluid restrictions    Larry Poag MD  [facility] 03/04/2024, 12:40 PM  Recent Labs  Lab 03/01/24 1804 03/02/24 0339  HGB 11.7* 11.5*  ALBUMIN 4.0 4.0  CALCIUM 9.3 9.4  CREATININE 5.95* 6.43*  K 4.1 4.0   No results for input(s): "IRON", "TIBC", "FERRITIN" in the last 168 hours. Inpatient medications:  atorvastatin  10 mg Oral QPM   Chlorhexidine Gluconate Cloth  6 each Topical Q0600   ferric citrate  630 mg Oral TID  WC   heparin  5,000 Units Subcutaneous Q8H   insulin aspart  0-5 Units Subcutaneous QHS   insulin aspart  0-6 Units Subcutaneous TID WC   insulin glargine-yfgn  8 Units Subcutaneous QHS   levothyroxine  75 mcg Oral Q0600   midodrine  10 mg Oral TID WC   polyethylene glycol  17 g Oral Daily   senna-docusate  1 tablet Oral QHS   sodium chloride flush  3 mL Intravenous Q12H   sodium chloride flush  3 mL Intravenous Q12H   vancomycin  125 mg Oral QODAY   Followed by   Cecily Cohen ON 03/17/2024] vancomycin  125 mg Oral Q3 days    cefTRIAXone (ROCEPHIN)  IV 2 g (03/03/24 1719)   [START ON 03/05/2024] vancomycin     acetaminophen **OR** acetaminophen, ondansetron **OR** ondansetron (ZOFRAN) IV, sodium chloride flush

## 2024-03-04 NOTE — Progress Notes (Signed)
 D/C order noted. Contacted [name] to advise staff of pt's d/c today and that pt should resume care tomorrow. Clinic requested that d/c summary be faxed once available for continuation of care. Will fax d/c summary once completed.   Lauraine Polite Renal Navigator [phone]

## 2024-03-04 NOTE — Discharge Summary (Signed)
 Physician Discharge Summary  Patient ID: Amy Moses MRN: [id number] DOB/AGE: [birth year] 53 y.o.  Admit date: 03/01/2024 Discharge date: 03/04/2024  Admission Diagnoses:  Discharge Diagnoses:  Principal Problem:   Cellulitis of right ankle Active Problems:   Insulin  dependent type 2 diabetes mellitus (HCC)   Acquired hypothyroidism   Anemia of chronic disease   ESRD on dialysis (HCC)   Calciphylaxis   Chronic diastolic CHF (congestive heart failure) (HCC)   Calciphylaxis of right lower extremity with nonhealing ulcer, limited to breakdown of skin (HCC)   Thrombocytopenia (HCC)   Peripheral vascular disease (HCC)   Nonhealing ulcer of heel (HCC)   Chronic hypotension   Recurrent Clostridioides difficile infection   Discharged Condition: stable  Hospital Course:  Patient is a 53 year old female with past medical history significant of right lower extremity nonhealing ulcer (follows orthopedist, Dr. Julio Ohm, on outpatient basis), peripheral vascular disease, pressure ulcer of the right heel stage II, ESRD on dialysis TTS schedule, chronic hypotension on midodrine , DM type II, hypothyroidism, OSA, thrombocytopenia, chronic diastolic heart failure, and recurrent C. difficile colitis.  Patient was admitted with right lower extremity cellulitis (with significant scaly skin involving right lower leg) and bleeding right sided foot ulcer.  X-ray of the right foot was limited due to overlying bandage.    Right-sided ankle cellulitis Chronic nonhealing pressure ulcer of right ankle Calciphylaxis of the right lower extremity with nonhealing ulcer Peripheral vascular disease -Patient was referred from the orthopedics clinic for evaluation for the right side ankle cellulitis.   -Patient reported that during the dressing changes, some bleeding was noticed.   -On presentation, examination revealed extensive calcification, calciphylaxis, nonhealing pressure ulcer of the ankle and cellulitis.     - Patient was managed with IV vancomycin  and Rocephin  during the hospital stay.   - Patient will be discharged back home on oral antibiotics. - Patient will follow-up with orthopedic teams, Dr. Julio Ohm, on discharge.         ESRD on HD TTS schedule -ESRD on dialysis TTS schedule.   - Nephrology team directed hemodialysis treatment during the hospital stay.     Chronic diastolic heart failure Chronic hypotension on midodrine  -No evidence of volume overload.  Continue midodrine  10 mg 3 times daily.   Thrombocytopenia -Low platelet count 80.   -Continue to monitor for bleeding.   Recurrent C. difficile associated colitis -Continue vancomycin  taper dose to complete until 4/27   Anemia of chronic disease -Stable H&H.  Continue oral iron  supplement.   Insulin -dependent DM type II - Continue Semglee  and sliding scale insulin  with mealtime coverage.   Hypothyroidism - Continue levothyroxine    Hyperlipidemia - Continue Lipitor   Consults: nephrology  Significant Diagnostic Studies:    Treatments: Antibiotics and wound care.  Discharge Exam: Blood pressure (!) 105/59, pulse 67, temperature 97.8 F (36.6 C), temperature source Oral, resp. rate 18, height 5\' 6"  (1.676 m), weight 74.8 kg, last menstrual period 08/22/2015, SpO2 99%.   Disposition: Discharge disposition: 01-Home or Self Care       Discharge Instructions     Diet - low sodium heart healthy   Complete by: As directed    Discharge wound care:   Complete by: As directed    Continue current wound care plan.   Increase activity slowly   Complete by: As directed       Allergies as of 03/04/2024       Reactions   Ace Inhibitors Cough        Medication List  STOP taking these medications    cholestyramine  4 g packet Commonly known as: QUESTRAN    diphenoxylate -atropine  2.5-0.025 MG tablet Commonly known as: LOMOTIL    famotidine  20 MG tablet Commonly known as: Pepcid    ondansetron  4 MG  disintegrating tablet Commonly known as: ZOFRAN -ODT       TAKE these medications    acetaminophen  325 MG tablet Commonly known as: TYLENOL  Take 2 tablets (650 mg total) by mouth every 6 (six) hours as needed for mild pain (pain score 1-3) (or Fever >/= 101).   acidophilus Caps capsule Take 2 capsules by mouth 3 (three) times daily. What changed:  how much to take when to take this   amoxicillin -clavulanate 500-125 MG tablet Commonly known as: Augmentin  Take 1 tablet by mouth at bedtime for 5 days.   atorvastatin  10 MG tablet Commonly known as: LIPITOR Take 10 mg by mouth every evening.   Auryxia  1 GM 210 MG(Fe) tablet Generic drug: ferric citrate  Take 630 mg by mouth 3 (three) times daily with meals.   doxycycline  100 MG tablet Commonly known as: VIBRA -TABS Take 1 tablet (100 mg total) by mouth 2 (two) times daily.   HumaLOG  KwikPen 100 UNIT/ML KwikPen Generic drug: insulin  lispro Inject 5-11 Units into the skin 3 (three) times daily with meals. If eats 50% or more of meal. What changed: additional instructions   insulin  glargine 100 UNIT/ML injection Commonly known as: LANTUS  Inject 0.08 mLs (8 Units total) into the skin at bedtime.   levothyroxine  75 MCG tablet Commonly known as: SYNTHROID  Take 75 mcg by mouth daily.   midodrine  10 MG tablet Commonly known as: PROAMATINE  Take 1 tablet (10 mg total) by mouth 3 (three) times daily.   polyethylene glycol 17 g packet Commonly known as: MIRALAX  / GLYCOLAX  Take 17 g by mouth daily. Start taking on: March 05, 2024   senna-docusate 8.6-50 MG tablet Commonly known as: Senokot-S Take 1 tablet by mouth at bedtime for 14 days.   vancomycin  125 MG capsule Commonly known as: VANCOCIN  Oral vancomycin  125 Mg p.o. once daily for 7 days, then 125 Mg p.o. every 3 days for 28 days. Start taking on: March 05, 2024 What changed: additional instructions   Vitamin D  (Ergocalciferol ) 1.25 MG (50000 UNIT) Caps  capsule Commonly known as: DRISDOL  Take 50,000 Units by mouth every 7 (seven) days. Friday               Discharge Care Instructions  (From admission, onward)           Start     Ordered   03/04/24 0000  Discharge wound care:       Comments: Continue current wound care plan.   03/04/24 1410            Time spent: 35 minutes.  SignedDoroteo Gasmen 03/04/2024, 2:38 PM

## 2024-03-04 NOTE — Discharge Planning (Signed)
 Patient alert.  IV access removed. Discharge teaching given to patient. Patient verbalized understanding of teaching including medication. Discharge paperwork placed in discharge packet and put with patient belongings. Patient will be transported home by her daughter.

## 2024-03-04 NOTE — Consult Note (Addendum)
 WOC Nurse Consult Note: this patient is followed by Dr. Julio Ohm for R heel Stage 2 Pressure Injury and Calciphylaxis to B lower legs; last seen 02/27/2024 with orders for silver (Aquacel AG) to wounds Reason for Consult: wounds B legs and R heel Wound type: 1. Full thickness r/t calciphylaxis  2.  Stage 2 R heel (healthy granulation tissue per Dr. Julio Ohm note 02/27/2024)  Pressure Injury POA: Yes Measurement: see nursing flowsheet  Wound bed: legs with dark hyperkeratotic tissue; R medial ankle pink moist;  R heel pink moist  Drainage (amount, consistency, odor) R heel bleeding on admission per MD note; legs appear dry otherwise  Periwound: heavy hyperkeratotic skin and dark areas of calciphylaxis  Dressing procedure/placement/frequency:  Cleanse R leg and R ankle/heel with Vashe wound cleanser [name] [phone]), do not rinse and allow to air dry. Apply silver hydrofiber ([place] [id number]) to R heel and R ankle as well as any other pink wound bed to legs.  Cover with ABD pads and secure with Kerlix roll gauze beginning right above toes and ending right below knee.  Apply Ace bandage wrapped in same fashion as Kerlix for light compression.   Per MD note patient has unna boot on L leg. Bedside nurse will need to cut off L leg unna boot, clean leg and page ortho tech to come and reapply.  Secure chat sent to primary MD regarding replacement of unna boot and order placed.    Patient should resume care with Dr. Julio Ohm at discharge and follow his wound care orders.    POC discussed with bedside nurse. WOC team will not follow. Re-consult if further needs arise.   Thank you,    Ronni Colace MSN, RN-BC, [employer] [phone]

## 2024-03-05 ENCOUNTER — Telehealth: Payer: Self-pay

## 2024-03-05 NOTE — Telephone Encounter (Signed)
 Verbal order given

## 2024-03-05 NOTE — Telephone Encounter (Signed)
 [facility] calling asking for wound ca orders for patient Amy Moses

## 2024-03-07 LAB — CULTURE, BLOOD (ROUTINE X 2)
Culture: NO GROWTH
Culture: NO GROWTH
Special Requests: ADEQUATE
Special Requests: ADEQUATE

## 2024-03-12 ENCOUNTER — Telehealth: Payer: Self-pay | Admitting: Orthopedic Surgery

## 2024-03-12 NOTE — Telephone Encounter (Signed)
 I called and se HHn to advise that she should have unna and profore dressing applied bilat lower extremity with silver  alginate to right foot ulcer. Will call with any questions

## 2024-03-12 NOTE — Telephone Encounter (Signed)
 [name] (LPN) called from [employer] asking for a call back from [name] or [name] concerning wound care orders. Amy Moses states Dr Julio Ohm addressed wound care for right leg/ thigh but pt also needs wound care orders for wound on leg leg/thigh. Please call Amy Moses on secure line at [phone].

## 2024-03-14 MED ORDER — DIFICID 200 MG PO TABS
200.0000 mg | ORAL_TABLET | Freq: Two times a day (BID) | ORAL | 0 refills | Status: DC
Start: 2024-03-14 — End: 2024-04-19

## 2024-03-14 MED ORDER — FLUCONAZOLE 150 MG PO TABS: 150.0000 mg | ORAL_TABLET | Freq: Once | ORAL | 0 refills | Status: AC

## 2024-03-14 NOTE — Addendum Note (Signed)
 Addended by: Delman Ferns on: 03/14/2024 03:04 PM   Modules accepted: Orders

## 2024-03-14 NOTE — Addendum Note (Signed)
 Addended by: Delman Ferns on: 03/14/2024 01:00 PM   Modules accepted: Orders

## 2024-03-14 NOTE — Telephone Encounter (Signed)
 Patient was hospitalized 4/11-4/14 due to foot cellulitis. Received abx while inpatient but also continued on vanc taper that had ben started in Feb 2025 for recurrent Cdiff.  Persistent loose stools now and suspect refractory disease at this point in light of ongoing abx exposure and hospital admission.   Will stop vanc and instead start Dificid  BID for 10 days. May need to do taper. Hold off on Vowst until done with Dificid  prescription. She can use Questran  1/2 packet daily to help with supportive measures.

## 2024-03-18 ENCOUNTER — Encounter: Payer: Self-pay | Admitting: Orthopedic Surgery

## 2024-03-18 ENCOUNTER — Ambulatory Visit (INDEPENDENT_AMBULATORY_CARE_PROVIDER_SITE_OTHER): Admitting: Orthopedic Surgery

## 2024-03-18 DIAGNOSIS — L89612 Pressure ulcer of right heel, stage 2: Secondary | ICD-10-CM

## 2024-03-18 DIAGNOSIS — I739 Peripheral vascular disease, unspecified: Secondary | ICD-10-CM

## 2024-03-18 DIAGNOSIS — L97911 Non-pressure chronic ulcer of unspecified part of right lower leg limited to breakdown of skin: Secondary | ICD-10-CM

## 2024-03-18 DIAGNOSIS — I890 Lymphedema, not elsewhere classified: Secondary | ICD-10-CM | POA: Diagnosis not present

## 2024-03-18 DIAGNOSIS — L97923 Non-pressure chronic ulcer of unspecified part of left lower leg with necrosis of muscle: Secondary | ICD-10-CM

## 2024-03-18 NOTE — Progress Notes (Signed)
 Office Visit Note   Patient: Amy Moses           Date of Birth: 1971-08-18           MRN: [id number] Visit Date: 03/18/2024              Requested by: Dorrine Gaudy, DO [address] [place],  [place] [address] PCP: Dorrine Gaudy, DO  Chief Complaint  Patient presents with   Right Leg - Follow-up   Left Leg - Follow-up      HPI: Patient is a 53 year old woman who is seen for evaluation for decubitus right heel ulcer as well as calciphylaxis with ulcers both lower extremities with venous and lymphatic insufficiency as well as an ischial ulcer on the right.  Assessment & Plan: Visit Diagnoses: No diagnosis found.  Plan: Prescription was written for patient to continue using silver  alginate to the ischial ulcer on the right plus a Mepilex dressing change twice a week.  Patient will also continue twice a week compressive dressing wraps for both lower extremities and using the foam boot for the right heel.  Follow-Up Instructions: Return in about 4 weeks (around 04/15/2024).   Ortho Exam  Patient is alert, oriented, no adenopathy, well-dressed, normal affect, normal respiratory effort. Examintion patient has a superficial ulcer over the ischium on the right there is healthy granulation tissue there is no tunneling or cellulitis or necrotic tissue.  The ulcer is 3 cm in diameter.  Patient has significant improvement in the ulcers on both legs with decreased swelling in both legs.  The right heel ulcer is 2 cm in diameter wi.  Th healthy granulation tissue.  There is also superficial ulceration of the left heel  Imaging: No results found. No images are attached to the encounter.  Labs: Lab Results  Component Value Date   HGBA1C 5.6 02/17/2024   HGBA1C 9.0 (H) 08/02/2023   HGBA1C 9.4 (H) 10/01/2022   ESRSEDRATE 7 08/03/2023   ESRSEDRATE 34 (H) 02/14/2022   ESRSEDRATE 40 (H) 08/07/2021   CRP 1.7 (H) 08/03/2023   CRP 7.0 (H) 02/14/2022   CRP 11.1 (H) 08/07/2021    REPTSTATUS 03/07/2024 FINAL 03/02/2024   GRAMSTAIN  07/08/2021    RARE WBC PRESENT,BOTH PMN AND MONONUCLEAR RARE GRAM POSITIVE RODS RARE GRAM POSITIVE COCCI IN PAIRS    CULT  03/02/2024    NO GROWTH 5 DAYS Performed at [name], [address], [place], [place] [address]    Augie Bliss KOSERI 04/28/2022     Lab Results  Component Value Date   ALBUMIN  4.0 03/02/2024   ALBUMIN  4.0 03/01/2024   ALBUMIN  4.0 02/16/2024   PREALBUMIN 22 08/03/2023   PREALBUMIN 23 08/02/2023   PREALBUMIN 13.7 (L) 08/07/2021    Lab Results  Component Value Date   MG 2.2 08/03/2023   MG 1.9 03/24/2023   MG 2.4 10/01/2022   Lab Results  Component Value Date   VD25OH 36.9 12/30/2021    Lab Results  Component Value Date   PREALBUMIN 22 08/03/2023   PREALBUMIN 23 08/02/2023   PREALBUMIN 13.7 (L) 08/07/2021      Latest Ref Rng & Units 03/02/2024    3:39 AM 03/01/2024    6:04 PM 02/18/2024    4:16 AM  CBC EXTENDED  WBC 4.0 - 10.5 K/uL 5.3  5.4  6.5   RBC 3.87 - 5.11 MIL/uL 4.08  4.11  3.72   Hemoglobin 12.0 - 15.0 g/dL 29.5  18.8  41.6  HCT 36.0 - 46.0 % 37.7  38.4  34.8   Platelets 150 - 400 K/uL 80  80  69      There is no height or weight on file to calculate BMI.  Orders:  No orders of the defined types were placed in this encounter.  No orders of the defined types were placed in this encounter.    Procedures: No procedures performed  Clinical Data: No additional findings.  ROS:  All other systems negative, except as noted in the HPI. Review of Systems  Objective: Vital Signs: LMP 08/22/2015 (Approximate)   Specialty Comments:  No specialty comments available.  PMFS History: Patient Active Problem List   Diagnosis Date Noted   Cellulitis of right ankle 03/02/2024   Recurrent Clostridioides difficile infection 02/21/2024   ESRD needing dialysis (HCC) 02/16/2024   Skin ulcer of right heel, limited to breakdown of skin (HCC) 01/15/2024    Nonhealing ulcer of heel (HCC) 01/13/2024   Shoulder pain 01/13/2024   Chronic hypotension 01/13/2024   Anemia due to chronic kidney disease 11/01/2023   Clostridioides difficile diarrhea 10/30/2023   Rash 10/30/2023   Diarrhea 10/24/2023   Non-pressure chronic ulcer of other part of right foot limited to breakdown of skin (HCC) 08/08/2023   Peripheral vascular disease (HCC) 08/08/2023   Acute osteomyelitis of right foot (HCC) 08/04/2023   OSA (obstructive sleep apnea) 08/02/2023   Prolapsed internal hemorrhoids, grade 3 03/29/2023   Thrombocytopenia (HCC) 03/21/2023   Mitral regurgitation 11/16/2022   Pressure injury of skin 10/01/2022   Pulmonary hypertension, unspecified (HCC) 09/12/2022   GERD (gastroesophageal reflux disease) 04/30/2022   Non-adherence to medical treatment 03/16/2022   Cutaneous abscess of right foot    Osteomyelitis of foot (HCC) 02/14/2022   Obesity (BMI 30-39.9) 01/26/2022   Ischemic ulcer of right foot (HCC) 01/24/2022   Anemia of chronic kidney failure, stage 5 (HCC) 01/24/2022   Sacral pressure ulcer 01/24/2022   Leg wound, left, sequela 12/10/2021   Open leg wound 09/03/2021   Wound infection    Non-pressure chronic ulcer of right calf limited to breakdown of skin (HCC)    Calciphylaxis of right lower extremity with nonhealing ulcer, limited to breakdown of skin (HCC)    Chronic ulcer of left thigh (HCC) 08/07/2021   Calciphylaxis of left lower extremity with nonhealing ulcer with necrosis of muscle (HCC) 08/07/2021   Chronic diastolic CHF (congestive heart failure) (HCC) 03/30/2021   ESRD on dialysis (HCC) 11/16/2020   Calciphylaxis 11/06/2020   Non-healing open wound of heel 11/03/2020   Diabetic foot infection (HCC) 11/01/2020   Decubitus ulcer, heel 11/01/2020   Closed nondisplaced fracture of left patella 10/29/2020   Acute on chronic renal failure (HCC) 06/10/2020   Anemia of chronic disease 06/10/2020   Vitamin D  deficiency 01/28/2019    Insulin  dependent type 2 diabetes mellitus (HCC) 09/21/2015   Mixed hyperlipidemia 09/21/2015   Primary hypertension 09/21/2015   Acquired hypothyroidism 09/21/2015   Iris bomb 07/31/2012   Secondary angle-closure glaucoma 07/31/2012   Past Medical History:  Diagnosis Date   Anemia    Blindness of right eye with low vision in contralateral eye    s/p victrectomy   Chronic diastolic heart failure (HCC) 03/30/2021   Diabetes mellitus, type II (HCC)    Dyslipidemia    ESRD on hemodialysis (HCC)    TTS in [place] , [place]   Glaucoma    History of blood transfusion    Hypertension    Hypothyroidism (acquired)  Mitral regurgitation 11/16/2022   Pneumonia    Pulmonary hypertension, unspecified (HCC) 09/12/2022    Family History  Problem Relation Age of Onset   Heart failure Mother    Heart disease Mother    Diabetes Mother    Kidney disease Mother    Heart failure Father    Diabetes Father    Heart disease Father    Diabetes Brother    Heart failure Maternal Grandmother    Heart failure Maternal Grandfather    Transient ischemic attack Maternal Grandfather    Colon cancer Neg Hx     Past Surgical History:  Procedure Laterality Date   ABDOMINAL AORTOGRAM W/LOWER EXTREMITY Bilateral 12/18/2020   Procedure: ABDOMINAL AORTOGRAM W/LOWER EXTREMITY;  Surgeon: Richrd Char, MD;  Location: [facility];  Service: Cardiovascular;  Laterality: Bilateral;   ABDOMINAL AORTOGRAM W/LOWER EXTREMITY Bilateral 01/25/2022   Procedure: ABDOMINAL AORTOGRAM W/LOWER EXTREMITY;  Surgeon: Margherita Shell, MD;  Location: [facility];  Service: Cardiovascular;  Laterality: Bilateral;   ABDOMINAL AORTOGRAM W/LOWER EXTREMITY Right 08/04/2023   Procedure: ABDOMINAL AORTOGRAM W/LOWER EXTREMITY;  Surgeon: Carlene Che, MD;  Location: [facility];  Service: Cardiovascular;  Laterality: Right;   AMPUTATION Right 02/16/2022   Procedure: RIGHT FOOT 5TH RAY AMPUTATION;  Surgeon:  Timothy Ford, MD;  Location: [facility];  Service: Orthopedics;  Laterality: Right;   ANKLE FRACTURE SURGERY Right    AV FISTULA PLACEMENT Left 08/18/2020   Procedure: LEFT ARM BRACHIOCEPHALIC ARTERIOVENOUS (AV) FISTULA CREATION;  Surgeon: Richrd Char, MD;  Location: [facility] OR;  Service: Vascular;  Laterality: Left;   BIOPSY  04/24/2021   Procedure: BIOPSY;  Surgeon: Vinetta Greening, DO;  Location: [facility];  Service: Endoscopy;;   CESAREAN SECTION     CHOLECYSTECTOMY     COLONOSCOPY  04/24/2021   Surgeon: Vinetta Greening, DO;  nonbleeding internal hemorrhoids, 1 large (25 mm) pedunculated transverse colon polyp (prolapse type polyp) with adherent clot and stigmata of recent bleed.   COLONOSCOPY WITH PROPOFOL  N/A 05/14/2021   Procedure: COLONOSCOPY WITH PROPOFOL ;  Surgeon: Suzette Espy, MD;  Location: [facility];  Service: Endoscopy;  Laterality: N/A;   COLONOSCOPY WITH PROPOFOL  N/A 03/17/2023   Procedure: COLONOSCOPY WITH PROPOFOL ;  Surgeon: Suzette Espy, MD;  Location: [facility];  Service: Endoscopy;  Laterality: N/A;   ESOPHAGOGASTRODUODENOSCOPY (EGD) WITH PROPOFOL  N/A 04/24/2021   Surgeon: Vinetta Greening, DO;  duodenal erosions and gastritis biopsied (pathology with peptic duodenitis, reactive gastropathy with erosions/chronic inflammation, negative for H. pylori)   EYE SURGERY     Vatrectomy   HEMOSTASIS CLIP PLACEMENT  05/14/2021   Procedure: HEMOSTASIS CLIP PLACEMENT;  Surgeon: Suzette Espy, MD;  [facility]: [facility];  Service: Endoscopy;;   IR PERC TUN PERIT CATH WO PORT S&I /IMAG  09/15/2020   IR REMOVAL TUN CV CATH W/O FL  02/19/2021   IR US  GUIDE VASC ACCESS RIGHT  09/15/2020   POLYPECTOMY  04/24/2021   Procedure: POLYPECTOMY;  Surgeon: Vinetta Greening, DO;  [facility]: [facility];  Service: Endoscopy;;   POLYPECTOMY  05/14/2021   Procedure: POLYPECTOMY;  Surgeon: Suzette Espy, MD;  [facility];  Service: Endoscopy;;   POLYPECTOMY   03/17/2023   Procedure: POLYPECTOMY;  Surgeon: Suzette Espy, MD;  Location: [facility];  Service: Endoscopy;;   SKIN SPLIT GRAFT Bilateral 09/03/2021   Procedure: SKIN GRAFT BILATERAL LEGS;  Surgeon: Timothy Ford,  MD;  Location: [facility];  Service: Orthopedics;  Laterality: Bilateral;   SKIN SPLIT GRAFT Left 12/10/2021   Procedure: IRRIGATION AND DEBRIDEMENT LEFT CALF, APPLICATION SPLIT THICKNESS SKIN GRAFT;  Surgeon: Timothy Ford, MD;  Location: [facility];  Service: Orthopedics;  Laterality: Left;   TOE SURGERY     Social History   Occupational History   Not on file  Tobacco Use   Smoking status: Never   Smokeless tobacco: Never  Vaping Use   Vaping status: Never Used  Substance and Sexual Activity   Alcohol use: No   Drug use: No   Sexual activity: Yes    Birth control/protection: Condom

## 2024-03-20 ENCOUNTER — Ambulatory Visit (INDEPENDENT_AMBULATORY_CARE_PROVIDER_SITE_OTHER): Admitting: Gastroenterology

## 2024-03-20 ENCOUNTER — Encounter: Payer: Self-pay | Admitting: Gastroenterology

## 2024-03-20 VITALS — BP 124/81 | HR 73 | Temp 97.5°F | Ht 66.0 in

## 2024-03-20 DIAGNOSIS — A0471 Enterocolitis due to Clostridium difficile, recurrent: Secondary | ICD-10-CM

## 2024-03-20 DIAGNOSIS — A0472 Enterocolitis due to Clostridium difficile, not specified as recurrent: Secondary | ICD-10-CM

## 2024-03-20 MED ORDER — ENSURE PLANT-BASED PROTEIN PO LIQD: 1.0000 | Freq: Two times a day (BID) | ORAL | 3 refills | Status: AC

## 2024-03-20 NOTE — Patient Instructions (Signed)
 Let's take 1/4 packet of Questran  on Monday, Wednesday, Friday. You will need to take it either an hour before or 4 hours after other medications (probably around midday).   You will complete Dificid  on 5/5.  On day of 5/6, you will take 8 ounces (1 cup) of the golytely  bowel prep in afternoon. Do not eat or drink anything after midnight.  The next morning, 5/7, you will take 4 capsules of Vowst on an empty stomach before eating. You will do this on 5/8 and 5/9 as well, then you will be done.  I have sent in the plant-based ensure to try. I would prefer you get nutrition in through food, but you can have this available for afternoon snack at least once a day or twice a day if unable to get good nutrition in. Please keep me updated with this!  I would like to see you around the 3rd week of May!  Please message with any concerns!  I enjoyed seeing you again today! I value our relationship and want to provide genuine, compassionate, and quality care. You may receive a survey regarding your visit with me, and I welcome your feedback! Thanks so much for taking the time to complete this. I look forward to seeing you again.      Delman Ferns, PhD, ANP-BC [facility]
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
## 2024-03-29 ENCOUNTER — Telehealth: Payer: Self-pay | Admitting: Radiology

## 2024-03-29 NOTE — Telephone Encounter (Signed)
 Per Amy Moses, pt has a wound rt lower buttock/top of thigh, she says its not doing good, has odor today, wants know if she can get a wound culture at the next visit. Please call Amy Moses @ [phone] to advise.

## 2024-03-29 NOTE — Telephone Encounter (Signed)
 I called and sw HHN to advise verbal ok for culture at next visit.  Pt will not use her slide board and this wound is coming from transfers from the wheelchair. Also spends 90% of her day sitting on this are. Trying to encourage pt to use offloading pillow and helped her to order this today.will use  Ag and adaptic dressing to this area.

## 2024-04-02 ENCOUNTER — Encounter (HOSPITAL_BASED_OUTPATIENT_CLINIC_OR_DEPARTMENT_OTHER): Payer: Self-pay | Admitting: Family

## 2024-04-02 ENCOUNTER — Ambulatory Visit (INDEPENDENT_AMBULATORY_CARE_PROVIDER_SITE_OTHER): Payer: Medicare Other | Admitting: Family

## 2024-04-02 ENCOUNTER — Ambulatory Visit (HOSPITAL_BASED_OUTPATIENT_CLINIC_OR_DEPARTMENT_OTHER): Admitting: Pulmonary Disease

## 2024-04-02 VITALS — BP 120/72 | HR 68 | Ht 66.0 in | Wt 170.0 lb

## 2024-04-02 DIAGNOSIS — I5022 Chronic systolic (congestive) heart failure: Secondary | ICD-10-CM

## 2024-04-02 DIAGNOSIS — I270 Primary pulmonary hypertension: Secondary | ICD-10-CM

## 2024-04-02 DIAGNOSIS — I340 Nonrheumatic mitral (valve) insufficiency: Secondary | ICD-10-CM

## 2024-04-02 DIAGNOSIS — G4733 Obstructive sleep apnea (adult) (pediatric): Secondary | ICD-10-CM | POA: Diagnosis not present

## 2024-04-02 DIAGNOSIS — E782 Mixed hyperlipidemia: Secondary | ICD-10-CM | POA: Diagnosis not present

## 2024-04-02 DIAGNOSIS — I5032 Chronic diastolic (congestive) heart failure: Secondary | ICD-10-CM | POA: Diagnosis not present

## 2024-04-02 LAB — PULMONARY FUNCTION TEST
DL/VA % pred: 93 %
DL/VA: 3.97 ml/min/mmHg/L
DLCO cor % pred: 53 %
DLCO cor: 12.14 ml/min/mmHg
DLCO unc % pred: 50 %
DLCO unc: 11.37 ml/min/mmHg
FEF 25-75 Post: 1.46 L/s
FEF 25-75 Pre: 2.76 L/s
FEF2575-%Change-Post: -46 %
FEF2575-%Pred-Post: 50 %
FEF2575-%Pred-Pre: 95 %
FEV1-%Change-Post: -9 %
FEV1-%Pred-Post: 58 %
FEV1-%Pred-Pre: 63 %
FEV1-Post: 1.74 L
FEV1-Pre: 1.92 L
FEV1FVC-%Change-Post: -10 %
FEV1FVC-%Pred-Pre: 108 %
FEV6-%Change-Post: 0 %
FEV6-%Pred-Post: 60 %
FEV6-%Pred-Pre: 59 %
FEV6-Post: 2.22 L
FEV6-Pre: 2.2 L
FEV6FVC-%Pred-Post: 102 %
FEV6FVC-%Pred-Pre: 102 %
FVC-%Change-Post: 1 %
FVC-%Pred-Post: 59 %
FVC-%Pred-Pre: 58 %
FVC-Post: 2.24 L
FVC-Pre: 2.21 L
Post FEV1/FVC ratio: 78 %
Post FEV6/FVC ratio: 100 %
Pre FEV1/FVC ratio: 87 %
Pre FEV6/FVC Ratio: 100 %

## 2024-04-02 NOTE — Patient Instructions (Addendum)
 Medication Instructions:  Your physician recommends that you continue on your current medications as directed. Please refer to the Current Medication list given to you today.  Follow-Up:  Please follow up in 6months with Dr. Theodis Fiscal, Slater Duncan, NP or Neomi Banks, NP   Other Instructions Exercises to do While Sitting Warm-up Before starting other exercises: Sit up as straight as you can. Have your knees bent at 90 degrees, which is the shape of the capital letter "L." Keep your feet flat on the floor. Sit at the front edge of your chair, if you can. Pull in (tighten) the muscles in your abdomen and stretch your spine and neck as straight as you can. Hold this position for a few minutes. Breathe in and out evenly. Try to concentrate on your breathing, and relax your mind.  Stretching Exercise A: Arm stretch Hold your arms out straight in front of your body. Bend your hands at the wrist with your fingers pointing up, as if signaling someone to stop. Notice the slight tension in your forearms as you hold the position. Keeping your arms out and your hands bent, rotate your hands outward as far as you can and hold this stretch. Aim to have your thumbs pointing up and your pinkie fingers pointing down. Slowly repeat arm stretches for one minute as tolerated. Exercise B: Leg stretch If you can move your legs, try to "draw" letters on the floor with the toes of your foot. Write your name with one foot. Write your name with the toes of your other foot. Slowly repeat the movements for one minute as tolerated. Exercise C: Reach for the sky Reach your hands as far over your head as you can to stretch your spine. Move your hands and arms as if you are climbing a rope. Slowly repeat the movements for one minute as tolerated.  Range of motion exercises Exercise A: Shoulder roll Let your arms hang loosely at your sides. Lift just your shoulders up toward your ears, then let them relax  back down. When your shoulders feel loose, rotate your shoulders in backward and forward circles. Do shoulder rolls slowly for one minute as tolerated. Exercise B: March in place As if you are marching, pump your arms and lift your legs up and down. Lift your knees as high as you can. If you are unable to lift your knees, just pump your arms and move your ankles and feet up and down. March in place for one minute as tolerated. Exercise C: Seated jumping jacks Let your arms hang down straight. Keeping your arms straight, lift them up over your head. Aim to point your fingers to the ceiling. While you lift your arms, straighten your legs and slide your heels along the floor to your sides, as wide as you can. As you bring your arms back down to your sides, slide your legs back together. If you are unable to use your legs, just move your arms. Slowly repeat seated jumping jacks for one minute as tolerated.  Strengthening exercises Exercise A: Shoulder squeeze Hold your arms straight out from your body to your sides, with your elbows bent and your fists pointed at the ceiling. Keeping your arms in the bent position, move them forward so your elbows and forearms meet in front of your face. Open your arms back out as wide as you can with your elbows still bent, until you feel your shoulder blades squeezing together. Hold for 5 seconds. Slowly repeat the movements forward  and backward for one minute as tolerated.

## 2024-04-02 NOTE — Progress Notes (Signed)
 Cardiology Office Note:  .   Date:  04/02/2024  ID:  Amy Moses, DOB [birth year], MRN [id number] PCP: Dorrine Gaudy, DO  [facility] Providers Cardiologist:  Maudine Sos, MD    History of Present Illness: .   [name] is a 53 y.o. female  with history of diabetes, hypertension, hyperlipidemia, pulmonary hypertension, ESRD on HD, right eye blindness, hypothyroidism.   Admitted 04/2022 with bacteremia treated with 4 weeks of antibiotics.  Echo at that time LVEF 45 to 50%, global hypokinesis, grade 2 diastolic dysfunction, PASP 54 mmHg.  Repeat echo in the outpatient setting 07/2022 LVEF 50 to 55%, indeterminate diastolic dysfunction, PASP 64 mmHg, RA pressure 8.  Lexiscan  Myoview  07/2022 LVEF 60%, fixed defect in mid to basal inferior and inferolateral region felt to be due to diaphragmatic activity, overall low risk study.   At visit 08/2022 she had episodes of symptomatic hypotension particularly during dialysis.  Midodrine  was ordered to utilize prior to dialysis.  Autoimmune workup was unremarkable.  PFTs were abnormal and referred to pulmonology.  Hospitalized 09/2022 and 02/2023 with GI bleed due to internal hemorrhoids.   Last seen 06/30/23 via phone visit with improvement in BP with Midodrine  10mg  TID.   Since that time multiple admissions.  9/11-9/17/24 with ischemic ulcer of right foot. Discuarged on abx.  12/9-12/13/24 with c.dif treated with fidamoxicin 2/22-2/28/25 with recurrent cdif treated with vancomycin  and chronic right heel ulcer with chronic osteomyelitis of cuboid bone recommended ofr supportive care/wound care 3/28-3/30/25 with persistent diarrhea 4/11-4/14/25 right ankle cellulitis managed with IV vancomcin and Rocephin   Presents today for follow up independently. Notes she has completed course of Vowst with improvement in diarrhea. Only one episode which she attributes to eating fried chicken wings. Reports volume status well managed by HD sessions.  No hypotension since utilization of Midodrine . Reports wearing her CPAP regularly. Continues to wrap RLE (per Dr. Julio Ohm silver  alginate to ulcer plus Mepilex dressing twoce per week), compression wrap, foam boot. Discussed importance of seated exercises to prevent atrophy while ambulation limited.   ROS: Please see the history of present illness.    All other systems reviewed and are negative.   Studies Reviewed: .        Cardiac Studies & Procedures   ______________________________________________________________________________________________   STRESS TESTS  MYOCARDIAL PERFUSION IMAGING 07/27/2022  Narrative   Findings are consistent with no prior ischemia. The study is low risk.   No ST deviation was noted.   LV perfusion is abnormal. Defect 1: There is a large defect with moderate reduction in uptake present in the mid to basal inferior and inferolateral location(s) that is fixed. There is normal wall motion in the defect area. Consistent with artifact caused by subdiaphragmatic activity.   Left ventricular function is normal. Nuclear stress EF: 60 %. The left ventricular ejection fraction is normal (55-65%). End diastolic cavity size is mildly enlarged. End systolic cavity size is normal.   Prior study not available for comparison.  Large size, moderate intensity fixed basal to mid inferior, inferolateral and apical perfusion defect with subdiaphragmatic attenuation artifact. No reversible ischemia. LVEF 60% with normal wall motion. Mild RV uptake may suggest pulmonary hypertension. This is a low risk study. No prior study for comparison.   ECHOCARDIOGRAM  ECHOCARDIOGRAM COMPLETE 07/27/2022  Narrative ECHOCARDIOGRAM REPORT    Patient Name:   Amy Moses Date of Exam: 07/27/2022 Medical Rec #:  [id number]       Height:       66.0 in  Accession #:    [id number]      Weight:       205.0 lb Date of Birth:  Jan 20, 1971      BSA:          2.021 m Patient Age:    50 years        BP:            122/68 mmHg Patient Gender: F               HR:           76 bpm. Exam Location:  [address]  Procedure: 2D Echo, Cardiac Doppler, Color Doppler and 3D Echo  Indications:    I50.42 CHF  History:        Patient has prior history of Echocardiogram examinations, most recent 04/01/2021. CHF, Arrythmias:ESRD; Risk Factors:Hypertension, Dyslipidemia and Diabetes.  Sonographer:    Lula Sale RDCS Referring Phys: [id number] TIFFANY [place]  IMPRESSIONS   1. Left ventricular ejection fraction, by estimation, is 50 to 55%. The left ventricle has low normal function. The left ventricle has no regional wall motion abnormalities. The left ventricular internal cavity size was mildly dilated. Left ventricular diastolic parameters are indeterminate. Elevated left ventricular end-diastolic pressure. 2. Right ventricular systolic function is mildly reduced. The right ventricular size is moderately enlarged. There is severely elevated pulmonary artery systolic pressure. The estimated right ventricular systolic pressure is 64.2 mmHg. 3. Left atrial size was moderately dilated. 4. Right atrial size was mildly dilated. 5. The mitral valve is degenerative. Moderate to severe mitral valve regurgitation. No evidence of mitral stenosis. 6. Tricuspid valve regurgitation is severe. 7. The aortic valve is calcified. There is moderate calcification of the aortic valve. There is severe thickening of the aortic valve. Aortic valve regurgitation is not visualized. Aortic valve sclerosis/calcification is present, without any evidence of aortic stenosis. 8. The inferior vena cava is dilated in size with >50% respiratory variability, suggesting right atrial pressure of 8 mmHg. 9. Small to moderate circumferential pericardial effusion with no Rv diastolic collapse or RA inversion. Respirophasic changes in MV inflow velocities was < 25%.  FINDINGS Left Ventricle: Left ventricular ejection fraction, by  estimation, is 50 to 55%. The left ventricle has low normal function. The left ventricle has no regional wall motion abnormalities. The left ventricular internal cavity size was mildly dilated. There is no left ventricular hypertrophy. Left ventricular diastolic parameters are indeterminate. Elevated left ventricular end-diastolic pressure.  Right Ventricle: The right ventricular size is moderately enlarged. No increase in right ventricular wall thickness. Right ventricular systolic function is mildly reduced. There is severely elevated pulmonary artery systolic pressure. The tricuspid regurgitant velocity is 3.75 m/s, and with an assumed right atrial pressure of 8 mmHg, the estimated right ventricular systolic pressure is 64.2 mmHg.  Left Atrium: Left atrial size was moderately dilated.  Right Atrium: Right atrial size was mildly dilated.  Pericardium: Small to moderate circumferential pericardial effusion with no Rv diastolic collapse or RA inversion. Respirophasic changes in MV inflow velocities was < 25%. A small pericardial effusion is present. The pericardial effusion is circumferential. There is no evidence of cardiac tamponade.  Mitral Valve: The mitral valve is degenerative in appearance. There is moderate thickening of the mitral valve leaflet(s). There is moderate calcification of the mitral valve leaflet(s). Mild to moderate mitral annular calcification. Moderate to severe mitral valve regurgitation, with eccentric posteriorly directed jet. No evidence of mitral valve stenosis.  Tricuspid Valve: The tricuspid valve is normal  in structure. Tricuspid valve regurgitation is severe. No evidence of tricuspid stenosis.  Aortic Valve: The aortic valve is calcified. There is moderate calcification of the aortic valve. There is severe thickening of the aortic valve. Aortic valve regurgitation is not visualized. Aortic valve sclerosis/calcification is present, without any evidence of aortic  stenosis.  Pulmonic Valve: The pulmonic valve was normal in structure. Pulmonic valve regurgitation is mild. No evidence of pulmonic stenosis.  Aorta: The aortic root is normal in size and structure.  Venous: The inferior vena cava is dilated in size with greater than 50% respiratory variability, suggesting right atrial pressure of 8 mmHg.  IAS/Shunts: No atrial level shunt detected by color flow Doppler.   LEFT VENTRICLE PLAX 2D LVIDd:         5.40 cm   Diastology LVIDs:         4.25 cm   LV e' medial:    8.49 cm/s LV PW:         1.00 cm   LV E/e' medial:  16.7 LV IVS:        1.10 cm   LV e' lateral:   12.30 cm/s LVOT diam:     1.70 cm   LV E/e' lateral: 11.5 LV SV:         50 LV SV Index:   25 LVOT Area:     2.27 cm  3D Volume EF: 3D EF:        52 % LV EDV:       171 ml LV ESV:       82 ml LV SV:        89 ml  RIGHT VENTRICLE             IVC RV S prime:     10.20 cm/s  IVC diam: 2.10 cm TAPSE (M-mode): 1.6 cm RVSP:           64.2 mmHg  LEFT ATRIUM           Index        RIGHT ATRIUM           Index LA diam:      4.20 cm 2.08 cm/m   RA Pressure: 8.00 mmHg LA Vol (A4C): 91.1 ml 45.08 ml/m  RA Area:     19.90 cm RA Volume:   61.50 ml  30.43 ml/m AORTIC VALVE LVOT Vmax:   97.40 cm/s LVOT Vmean:  65.200 cm/s LVOT VTI:    0.219 m  AORTA Ao Root diam: 2.90 cm Ao Asc diam:  3.00 cm  MITRAL VALVE                TRICUSPID VALVE MV Area (PHT): 3.28 cm     TR Peak grad:   56.2 mmHg MV Decel Time: 231 msec     TR Vmax:        375.00 cm/s MV E velocity: 142.00 cm/s  Estimated RAP:  8.00 mmHg MV A velocity: 102.00 cm/s  RVSP:           64.2 mmHg MV E/A ratio:  1.39 SHUNTS Systemic VTI:  0.22 m Systemic Diam: 1.70 cm  Gaylyn Keas MD Electronically signed by Gaylyn Keas MD Signature Date/Time: 07/27/2022/10:31:09 AM    Final          ______________________________________________________________________________________________      Risk  Assessment/Calculations:             Physical Exam:   VS:  BP 120/72   Pulse  68   Ht 5\' 6"  (1.676 m)   Wt 170 lb (77.1 kg)   LMP 08/22/2015 (Approximate)   SpO2 95%   BMI 27.44 kg/m    Wt Readings from Last 3 Encounters:  04/02/24 170 lb (77.1 kg)  03/02/24 164 lb 14.5 oz (74.8 kg)  02/17/24 198 lb 13.7 oz (90.2 kg)    RUE:AVWU, well developed in no acute distress NECK: No JVD; No carotid bruits CARDIAC: RRR, no  rubs, gallops.IV/VI systolic murmur throughout.  RESPIRATORY:  Clear to auscultation without rales, wheezing or rhonchi  ABDOMEN: Soft, non-tender, non-distended EXTREMITIES:  No edema; No deformity. RLE with compression wrap and foam boot in place  ASSESSMENT AND PLAN: .     Chronic diastolic heart failure- Volume management per HD. No new orthopnea, PND, dyspnea. BP and ESRD preclude heart failure medications. Low sodium diet, fluid restriction <2L, and daily weights encouraged.    ESRD on HD - Follows with nephrology. No significant hypotension with HD. Using Midodrine  10mg  TID with no recurrence of hypotension.    Pulmonary hypertension- Suspected due to MR and diastolic dysfunction. Follows with pulmonology. PFT's upcoming later today.    Mitral regurgitation-per Dr. Theodis Fiscal previous notes not good surgical candidate and anatomy challenging for Mitraclip. No new heart failure symptoms.   OSA - Endorses compliance with CPAP. CPAP compliance encouraged.    Hyperlipidemia- Continue Atorvastatin  10mg  daily. 07/2023 LDL 13.       Dispo: follow up in 6 months  Signed, Clearnce Curia, NP

## 2024-04-02 NOTE — Patient Instructions (Signed)
 FVC pre/post and DLCO performed today

## 2024-04-02 NOTE — Progress Notes (Signed)
 FVC pre/post and DLCO performed today

## 2024-04-09 ENCOUNTER — Ambulatory Visit: Payer: Self-pay | Admitting: Adult Health

## 2024-04-09 NOTE — Progress Notes (Signed)
 Called and spoke with patient, advised of results/recommendations per Rubye Oaks NP.  She verbalized understanding.  Nothing further needed.

## 2024-04-10 ENCOUNTER — Ambulatory Visit: Admitting: Gastroenterology

## 2024-04-12 ENCOUNTER — Telehealth: Payer: Self-pay | Admitting: Orthopedic Surgery

## 2024-04-12 ENCOUNTER — Other Ambulatory Visit: Payer: Self-pay | Admitting: Orthopedic Surgery

## 2024-04-12 MED ORDER — DOXYCYCLINE HYCLATE 100 MG PO TABS: 100.0000 mg | ORAL_TABLET | Freq: Every day | ORAL | 0 refills | Status: DC

## 2024-04-12 NOTE — Telephone Encounter (Signed)
 [name]'s number [phone]

## 2024-04-12 NOTE — Telephone Encounter (Signed)
 Avanell Bob informed, doxycycline  sent.

## 2024-04-12 NOTE — Telephone Encounter (Signed)
 [name] with [facility] called. She says there's something growing in patient's wound. Would like to know what she needs to do. Are there any verbal orders for the nurse? Any antibiotics that could be called in for the weekend.

## 2024-04-12 NOTE — Telephone Encounter (Signed)
 I called and sw [facility] and she said that they had obtained a culture on the pt and that she was growing bacteria but could not remember what it was or what meds it was sensitive to. I looked in chart and could not find result from the 20th. [facility] states that she will call her office and ask for them to fax it over to my attention so we can relay the information to Dr. Julio Ohm.

## 2024-04-12 NOTE — Telephone Encounter (Signed)
 Detected bacteria are: Klebsiella oxytoca, Bacteriodes fragilis, Proteus mirabilis, Abx treatment recommended is,bactrim ds, cipro, or doxycycline  (all 5 to 7 day treatment due to kidney function)

## 2024-04-17 ENCOUNTER — Encounter (HOSPITAL_COMMUNITY): Payer: Self-pay

## 2024-04-17 ENCOUNTER — Emergency Department (HOSPITAL_COMMUNITY)

## 2024-04-17 ENCOUNTER — Other Ambulatory Visit: Payer: Self-pay

## 2024-04-17 ENCOUNTER — Inpatient Hospital Stay (HOSPITAL_COMMUNITY)
Admission: EM | Admit: 2024-04-17 | Discharge: 2024-04-19 | DRG: 871 | Disposition: A | Discharge status: Home Health | Attending: Family Medicine | Admitting: Family Medicine

## 2024-04-17 DIAGNOSIS — Z89421 Acquired absence of other right toe(s): Secondary | ICD-10-CM

## 2024-04-17 DIAGNOSIS — E871 Hypo-osmolality and hyponatremia: Secondary | ICD-10-CM | POA: Diagnosis present

## 2024-04-17 DIAGNOSIS — Z8711 Personal history of peptic ulcer disease: Secondary | ICD-10-CM

## 2024-04-17 DIAGNOSIS — A419 Sepsis, unspecified organism: Secondary | ICD-10-CM | POA: Diagnosis present

## 2024-04-17 DIAGNOSIS — D638 Anemia in other chronic diseases classified elsewhere: Secondary | ICD-10-CM | POA: Diagnosis present

## 2024-04-17 DIAGNOSIS — Z1152 Encounter for screening for COVID-19: Secondary | ICD-10-CM

## 2024-04-17 DIAGNOSIS — I132 Hypertensive heart and chronic kidney disease with heart failure and with stage 5 chronic kidney disease, or end stage renal disease: Secondary | ICD-10-CM | POA: Diagnosis present

## 2024-04-17 DIAGNOSIS — E1122 Type 2 diabetes mellitus with diabetic chronic kidney disease: Secondary | ICD-10-CM | POA: Diagnosis present

## 2024-04-17 DIAGNOSIS — T148XXA Other injury of unspecified body region, initial encounter: Secondary | ICD-10-CM

## 2024-04-17 DIAGNOSIS — I272 Pulmonary hypertension, unspecified: Secondary | ICD-10-CM | POA: Diagnosis present

## 2024-04-17 DIAGNOSIS — E8729 Other acidosis: Secondary | ICD-10-CM | POA: Diagnosis not present

## 2024-04-17 DIAGNOSIS — L89313 Pressure ulcer of right buttock, stage 3: Secondary | ICD-10-CM | POA: Diagnosis present

## 2024-04-17 DIAGNOSIS — Z992 Dependence on renal dialysis: Secondary | ICD-10-CM

## 2024-04-17 DIAGNOSIS — N2581 Secondary hyperparathyroidism of renal origin: Secondary | ICD-10-CM | POA: Diagnosis present

## 2024-04-17 DIAGNOSIS — Z794 Long term (current) use of insulin: Secondary | ICD-10-CM | POA: Diagnosis not present

## 2024-04-17 DIAGNOSIS — D631 Anemia in chronic kidney disease: Secondary | ICD-10-CM | POA: Diagnosis present

## 2024-04-17 DIAGNOSIS — Z79890 Hormone replacement therapy (postmenopausal): Secondary | ICD-10-CM

## 2024-04-17 DIAGNOSIS — E039 Hypothyroidism, unspecified: Secondary | ICD-10-CM | POA: Diagnosis present

## 2024-04-17 DIAGNOSIS — N186 End stage renal disease: Secondary | ICD-10-CM

## 2024-04-17 DIAGNOSIS — D72829 Elevated white blood cell count, unspecified: Secondary | ICD-10-CM | POA: Insufficient documentation

## 2024-04-17 DIAGNOSIS — L089 Local infection of the skin and subcutaneous tissue, unspecified: Secondary | ICD-10-CM | POA: Diagnosis present

## 2024-04-17 DIAGNOSIS — E440 Moderate protein-calorie malnutrition: Secondary | ICD-10-CM | POA: Diagnosis present

## 2024-04-17 DIAGNOSIS — E119 Type 2 diabetes mellitus without complications: Secondary | ICD-10-CM

## 2024-04-17 DIAGNOSIS — E872 Acidosis, unspecified: Secondary | ICD-10-CM | POA: Diagnosis present

## 2024-04-17 DIAGNOSIS — G4700 Insomnia, unspecified: Secondary | ICD-10-CM | POA: Diagnosis not present

## 2024-04-17 DIAGNOSIS — Z888 Allergy status to other drugs, medicaments and biological substances status: Secondary | ICD-10-CM

## 2024-04-17 DIAGNOSIS — D696 Thrombocytopenia, unspecified: Secondary | ICD-10-CM | POA: Diagnosis present

## 2024-04-17 DIAGNOSIS — Z6828 Body mass index (BMI) 28.0-28.9, adult: Secondary | ICD-10-CM

## 2024-04-17 DIAGNOSIS — D259 Leiomyoma of uterus, unspecified: Secondary | ICD-10-CM | POA: Diagnosis present

## 2024-04-17 DIAGNOSIS — Z79899 Other long term (current) drug therapy: Secondary | ICD-10-CM

## 2024-04-17 DIAGNOSIS — E1151 Type 2 diabetes mellitus with diabetic peripheral angiopathy without gangrene: Secondary | ICD-10-CM | POA: Diagnosis present

## 2024-04-17 DIAGNOSIS — L89213 Pressure ulcer of right hip, stage 3: Secondary | ICD-10-CM | POA: Diagnosis present

## 2024-04-17 DIAGNOSIS — E782 Mixed hyperlipidemia: Secondary | ICD-10-CM | POA: Diagnosis present

## 2024-04-17 DIAGNOSIS — L89610 Pressure ulcer of right heel, unstageable: Secondary | ICD-10-CM | POA: Diagnosis present

## 2024-04-17 DIAGNOSIS — E8809 Other disorders of plasma-protein metabolism, not elsewhere classified: Secondary | ICD-10-CM | POA: Diagnosis present

## 2024-04-17 DIAGNOSIS — Z833 Family history of diabetes mellitus: Secondary | ICD-10-CM

## 2024-04-17 DIAGNOSIS — I9589 Other hypotension: Secondary | ICD-10-CM | POA: Diagnosis not present

## 2024-04-17 DIAGNOSIS — A0472 Enterocolitis due to Clostridium difficile, not specified as recurrent: Secondary | ICD-10-CM | POA: Diagnosis present

## 2024-04-17 DIAGNOSIS — H5461 Unqualified visual loss, right eye, normal vision left eye: Secondary | ICD-10-CM | POA: Diagnosis present

## 2024-04-17 DIAGNOSIS — I5032 Chronic diastolic (congestive) heart failure: Secondary | ICD-10-CM | POA: Diagnosis present

## 2024-04-17 DIAGNOSIS — G4733 Obstructive sleep apnea (adult) (pediatric): Secondary | ICD-10-CM | POA: Diagnosis present

## 2024-04-17 DIAGNOSIS — Z860100 Personal history of colon polyps, unspecified: Secondary | ICD-10-CM

## 2024-04-17 DIAGNOSIS — E46 Unspecified protein-calorie malnutrition: Secondary | ICD-10-CM | POA: Insufficient documentation

## 2024-04-17 DIAGNOSIS — Z841 Family history of disorders of kidney and ureter: Secondary | ICD-10-CM

## 2024-04-17 DIAGNOSIS — Z8249 Family history of ischemic heart disease and other diseases of the circulatory system: Secondary | ICD-10-CM

## 2024-04-17 LAB — CBC WITH DIFFERENTIAL/PLATELET
Abs Immature Granulocytes: 0.8 10*3/uL — ABNORMAL HIGH (ref 0.00–0.07)
Basophils Absolute: 0.1 10*3/uL (ref 0.0–0.1)
Basophils Relative: 0 %
Eosinophils Absolute: 0.1 10*3/uL (ref 0.0–0.5)
Eosinophils Relative: 1 %
HCT: 35.2 % — ABNORMAL LOW (ref 36.0–46.0)
Hemoglobin: 11.2 g/dL — ABNORMAL LOW (ref 12.0–15.0)
Immature Granulocytes: 4 %
Lymphocytes Relative: 3 %
Lymphs Abs: 0.5 10*3/uL — ABNORMAL LOW (ref 0.7–4.0)
MCH: 28.4 pg (ref 26.0–34.0)
MCHC: 31.8 g/dL (ref 30.0–36.0)
MCV: 89.1 fL (ref 80.0–100.0)
Monocytes Absolute: 1.6 10*3/uL — ABNORMAL HIGH (ref 0.1–1.0)
Monocytes Relative: 8 %
Neutro Abs: 17.3 10*3/uL — ABNORMAL HIGH (ref 1.7–7.7)
Neutrophils Relative %: 84 %
Platelets: 143 10*3/uL — ABNORMAL LOW (ref 150–400)
RBC: 3.95 MIL/uL (ref 3.87–5.11)
RDW: 13.8 % (ref 11.5–15.5)
WBC: 20.4 10*3/uL — ABNORMAL HIGH (ref 4.0–10.5)
nRBC: 0 % (ref 0.0–0.2)

## 2024-04-17 LAB — COMPREHENSIVE METABOLIC PANEL WITH GFR
ALT: 8 U/L (ref 0–44)
AST: 10 U/L — ABNORMAL LOW (ref 15–41)
Albumin: 3.1 g/dL — ABNORMAL LOW (ref 3.5–5.0)
Alkaline Phosphatase: 98 U/L (ref 38–126)
Anion gap: 19 — ABNORMAL HIGH (ref 5–15)
BUN: 54 mg/dL — ABNORMAL HIGH (ref 6–20)
CO2: 20 mmol/L — ABNORMAL LOW (ref 22–32)
Calcium: 9.1 mg/dL (ref 8.9–10.3)
Chloride: 89 mmol/L — ABNORMAL LOW (ref 98–111)
Creatinine, Ser: 9.03 mg/dL — ABNORMAL HIGH (ref 0.44–1.00)
GFR, Estimated: 5 mL/min — ABNORMAL LOW (ref >60)
Glucose, Bld: 89 mg/dL (ref 70–99)
Potassium: 4.8 mmol/L (ref 3.5–5.1)
Sodium: 128 mmol/L — ABNORMAL LOW (ref 135–145)
Total Bilirubin: 1.5 mg/dL — ABNORMAL HIGH (ref 0.0–1.2)
Total Protein: 6.7 g/dL (ref 6.5–8.1)

## 2024-04-17 LAB — PROTIME-INR
INR: 1.3 — ABNORMAL HIGH (ref 0.8–1.2)
Prothrombin Time: 16.5 s — ABNORMAL HIGH (ref 11.4–15.2)

## 2024-04-17 LAB — RESP PANEL BY RT-PCR (RSV, FLU A&B, COVID)  RVPGX2
Influenza A by PCR: NEGATIVE
Influenza B by PCR: NEGATIVE
Resp Syncytial Virus by PCR: NEGATIVE
SARS Coronavirus 2 by RT PCR: NEGATIVE

## 2024-04-17 LAB — LACTIC ACID, PLASMA: Lactic Acid, Venous: 1.3 mmol/L (ref 0.5–1.9)

## 2024-04-17 MED ORDER — SODIUM CHLORIDE 0.9 % IV SOLN
2.0000 g | Freq: Once | INTRAVENOUS | Status: AC
  Administered 2024-04-17: 2 g via INTRAVENOUS
  Filled 2024-04-17: qty 20

## 2024-04-17 MED ORDER — MELATONIN 3 MG PO TABS
6.0000 mg | ORAL_TABLET | Freq: Every evening | ORAL | Status: DC | PRN
  Administered 2024-04-18: 6 mg via ORAL
  Filled 2024-04-17: qty 2

## 2024-04-17 MED ORDER — ONDANSETRON HCL 4 MG PO TABS: 4.0000 mg | ORAL_TABLET | Freq: Four times a day (QID) | ORAL | Status: DC | PRN

## 2024-04-17 MED ORDER — SODIUM CHLORIDE 0.9 % IV BOLUS
500.0000 mL | Freq: Once | INTRAVENOUS | Status: AC
  Administered 2024-04-17: 500 mL via INTRAVENOUS

## 2024-04-17 MED ORDER — INSULIN ASPART 100 UNIT/ML IJ SOLN
0.0000 [IU] | Freq: Three times a day (TID) | INTRAMUSCULAR | Status: DC
  Administered 2024-04-18: 1 [IU] via SUBCUTANEOUS
  Administered 2024-04-19: 3 [IU] via SUBCUTANEOUS
  Administered 2024-04-19: 1 [IU] via SUBCUTANEOUS

## 2024-04-17 MED ORDER — ACETAMINOPHEN 650 MG RE SUPP: 650.0000 mg | Freq: Four times a day (QID) | RECTAL | Status: DC | PRN

## 2024-04-17 MED ORDER — ACETAMINOPHEN 325 MG PO TABS: 650.0000 mg | ORAL_TABLET | Freq: Four times a day (QID) | ORAL | Status: DC | PRN

## 2024-04-17 MED ORDER — ONDANSETRON HCL 4 MG/2ML IJ SOLN
4.0000 mg | Freq: Four times a day (QID) | INTRAMUSCULAR | Status: DC | PRN
Start: 2024-04-17 — End: 2024-04-19
  Administered 2024-04-18: 4 mg via INTRAVENOUS
  Filled 2024-04-17: qty 2

## 2024-04-17 MED ORDER — SODIUM CHLORIDE 0.9 % IV BOLUS: 250.0000 mL | Freq: Once | INTRAVENOUS | Status: DC

## 2024-04-17 MED ORDER — MIDODRINE HCL 5 MG PO TABS
10.0000 mg | ORAL_TABLET | Freq: Once | ORAL | Status: AC
  Administered 2024-04-17: 10 mg via ORAL
  Filled 2024-04-17: qty 2

## 2024-04-17 MED ORDER — ATORVASTATIN CALCIUM 10 MG PO TABS
10.0000 mg | ORAL_TABLET | Freq: Every evening | ORAL | Status: DC
  Administered 2024-04-18: 10 mg via ORAL
  Filled 2024-04-17: qty 1

## 2024-04-17 MED ORDER — METRONIDAZOLE 500 MG/100ML IV SOLN
500.0000 mg | Freq: Two times a day (BID) | INTRAVENOUS | Status: DC
  Administered 2024-04-18 – 2024-04-19 (×3): 500 mg via INTRAVENOUS
  Filled 2024-04-17 (×3): qty 100

## 2024-04-17 MED ORDER — GLUCERNA SHAKE PO LIQD: 237.0000 mL | Freq: Three times a day (TID) | ORAL | Status: DC

## 2024-04-17 MED ORDER — HEPARIN SODIUM (PORCINE) 5000 UNIT/ML IJ SOLN
5000.0000 [IU] | Freq: Three times a day (TID) | INTRAMUSCULAR | Status: DC
  Administered 2024-04-17 – 2024-04-19 (×5): 5000 [IU] via SUBCUTANEOUS
  Filled 2024-04-17 (×5): qty 1

## 2024-04-17 MED ORDER — ENSURE PLUS HIGH PROTEIN PO LIQD: 237.0000 mL | Freq: Two times a day (BID) | ORAL | Status: DC

## 2024-04-17 MED ORDER — LEVOFLOXACIN IN D5W 750 MG/150ML IV SOLN
750.0000 mg | Freq: Once | INTRAVENOUS | Status: AC
  Administered 2024-04-17: 750 mg via INTRAVENOUS
  Filled 2024-04-17: qty 150

## 2024-04-17 MED ORDER — LEVOTHYROXINE SODIUM 75 MCG PO TABS
75.0000 ug | ORAL_TABLET | Freq: Every day | ORAL | Status: DC
  Administered 2024-04-18 – 2024-04-19 (×2): 75 ug via ORAL
  Filled 2024-04-17 (×2): qty 1

## 2024-04-17 MED ORDER — SODIUM CHLORIDE 0.9 % IV BOLUS: 500.0000 mL | Freq: Once | INTRAVENOUS | Status: DC

## 2024-04-17 MED ORDER — LEVOFLOXACIN IN D5W 500 MG/100ML IV SOLN: 500.0000 mg | INTRAVENOUS | Status: DC

## 2024-04-17 MED ORDER — METRONIDAZOLE 500 MG/100ML IV SOLN
500.0000 mg | Freq: Once | INTRAVENOUS | Status: AC
  Administered 2024-04-17: 500 mg via INTRAVENOUS
  Filled 2024-04-17: qty 100

## 2024-04-17 MED ORDER — MIDODRINE HCL 5 MG PO TABS
10.0000 mg | ORAL_TABLET | Freq: Three times a day (TID) | ORAL | Status: DC
  Administered 2024-04-18 – 2024-04-19 (×5): 10 mg via ORAL
  Filled 2024-04-17 (×5): qty 2

## 2024-04-17 MED ORDER — CHLORHEXIDINE GLUCONATE CLOTH 2 % EX PADS
6.0000 | MEDICATED_PAD | Freq: Every day | CUTANEOUS | Status: DC
  Administered 2024-04-17: 6 via TOPICAL

## 2024-04-17 NOTE — Sepsis Progress Note (Signed)
 Elink will follow per sepsis protocol.

## 2024-04-17 NOTE — H&P (Signed)
 History and Physical    Patient: Amy Moses ZOX:[id number] DOB: Nov 08, 1971 DOA: 04/17/2024 DOS: the patient was seen and examined on 04/17/2024 PCP: Dorrine Gaudy, DO  Patient coming from: Home  Chief Complaint:  Chief Complaint  Patient presents with   Weakness   HPI: Amy Moses is a 53 y.o. female with medical history significant of   right lower extremity nonhealing ulcer follows orthopedist Dr. Julio Ohm as an outpatient, peripheral vascular disease, pressure ulcer of the right heel, ESRD on dialysis TTS schedule, chronic hypotension on midodrine , DM type II, hypothyroidism, OSA, thrombocytopenia, chronic diastolic heart failure, and recurrent C. difficile colitis who presents emergency department due to 2-day onset of generalized fatigue, weakness and lack of appetite.  She endorsed 2 episodes of watery diarrhea on Saturday (5/24) and it self resolved.  She missed dialysis yesterday due to the weakness and fatigue.  She denies nausea, vomiting, fever, chest pain or shortness of breath.  ED Course:  In the emergency department, BP was soft at 91/62, other vital signs were within normal range.  Workup in the ED showed leukocytosis with WBC of 20.4, normocytic anemia, thrombocytopenia.  BMP showed sodium 128, potassium 4.8, chloride 89, bicarb 20, blood glucose 89, BUN 54, creatinine 9.03, albumin  3.1.  Lactic acid was normal at 1.3.  Influenza A, B, SARS coronavirus 2, RSV was normal, blood culture was pending. CT abdomen and pelvis showed no acute intra-abdominal or intrapelvic abnormality.  Uterine fibroids Chest x-ray showed unchanged moderate cardiomegaly. She was treated with IV ceftriaxone  and metronidazole .  IV hydration was provided per sepsis protocol.  Hospitalist was asked to admit patient for further evaluation and management.  Review of Systems: Review of systems as noted in the HPI. All other systems reviewed and are negative.   Past Medical History:  Diagnosis Date    Anemia    Blindness of right eye with low vision in contralateral eye    s/p victrectomy   Chronic diastolic heart failure (HCC) 03/30/2021   Diabetes mellitus, type II (HCC)    Dyslipidemia    ESRD on hemodialysis (HCC)    TTS in [place] , [place]   Glaucoma    History of blood transfusion    Hypertension    Hypothyroidism (acquired)    Mitral regurgitation 11/16/2022   Pneumonia    Pulmonary hypertension, unspecified (HCC) 09/12/2022   Past Surgical History:  Procedure Laterality Date   ABDOMINAL AORTOGRAM W/LOWER EXTREMITY Bilateral 12/18/2020   Procedure: ABDOMINAL AORTOGRAM W/LOWER EXTREMITY;  Surgeon: Richrd Char, MD;  Location: [facility];  Service: Cardiovascular;  Laterality: Bilateral;   ABDOMINAL AORTOGRAM W/LOWER EXTREMITY Bilateral 01/25/2022   Procedure: ABDOMINAL AORTOGRAM W/LOWER EXTREMITY;  Surgeon: Margherita Shell, MD;  Location: [facility];  Service: Cardiovascular;  Laterality: Bilateral;   ABDOMINAL AORTOGRAM W/LOWER EXTREMITY Right 08/04/2023   Procedure: ABDOMINAL AORTOGRAM W/LOWER EXTREMITY;  Surgeon: Carlene Che, MD;  Location: [facility];  Service: Cardiovascular;  Laterality: Right;   AMPUTATION Right 02/16/2022   Procedure: RIGHT FOOT 5TH RAY AMPUTATION;  Surgeon: Timothy Ford, MD;  Location[facility];  Service: Orthopedics;  Laterality: Right;   ANKLE FRACTURE SURGERY Right    AV FISTULA PLACEMENT Left 08/18/2020   Procedure: LEFT ARM BRACHIOCEPHALIC ARTERIOVENOUS (AV) FISTULA CREATION;  Surgeon: Richrd Char, MD;  Location: [facility] OR;  Service: Vascular;  Laterality: Left;   BIOPSY  04/24/2021   Procedure: BIOPSY;  Surgeon: Vinetta Greening, DO;  Location: [facility];  Service: Endoscopy;;   CESAREAN SECTION     CHOLECYSTECTOMY     COLONOSCOPY  04/24/2021   Surgeon: Vinetta Greening, DO;  nonbleeding internal hemorrhoids, 1 large (25 mm) pedunculated transverse colon polyp (prolapse type polyp) with adherent clot and  stigmata of recent bleed.   COLONOSCOPY WITH PROPOFOL  N/A 05/14/2021   Procedure: COLONOSCOPY WITH PROPOFOL ;  Surgeon: Suzette Espy, MD;  Location: [facility];  Service: Endoscopy;  Laterality: N/A;   COLONOSCOPY WITH PROPOFOL  N/A 03/17/2023   Procedure: COLONOSCOPY WITH PROPOFOL ;  Surgeon: Suzette Espy, MD;  Location: [facility];  Service: Endoscopy;  Laterality: N/A;   ESOPHAGOGASTRODUODENOSCOPY (EGD) WITH PROPOFOL  N/A 04/24/2021   Surgeon: Vinetta Greening, DO;  duodenal erosions and gastritis biopsied (pathology with peptic duodenitis, reactive gastropathy with erosions/chronic inflammation, negative for H. pylori)   EYE SURGERY     Vatrectomy   HEMOSTASIS CLIP PLACEMENT  05/14/2021   Procedure: HEMOSTASIS CLIP PLACEMENT;  Surgeon: Suzette Espy, MD;  [facility]: [facility];  Service: Endoscopy;;   IR PERC TUN PERIT CATH WO PORT S&I /IMAG  09/15/2020   IR REMOVAL TUN CV CATH W/O FL  02/19/2021   IR US  GUIDE VASC ACCESS RIGHT  09/15/2020   POLYPECTOMY  04/24/2021   Procedure: POLYPECTOMY;  Surgeon: Vinetta Greening, DO;  [facility]: [facility];  Service: Endoscopy;;   POLYPECTOMY  05/14/2021   Procedure: POLYPECTOMY;  Surgeon: Suzette Espy, MD;  [facility];  Service: Endoscopy;;   POLYPECTOMY  03/17/2023   Procedure: POLYPECTOMY;  Surgeon: Suzette Espy, MD;  [facility]: [facility];  Service: Endoscopy;;   SKIN SPLIT GRAFT Bilateral 09/03/2021   Procedure: SKIN GRAFT BILATERAL LEGS;  Surgeon: Timothy Ford, MD;  Location: [facility] OR;  Service: Orthopedics;  Laterality: Bilateral;   SKIN SPLIT GRAFT Left 12/10/2021   Procedure: IRRIGATION AND DEBRIDEMENT LEFT CALF, APPLICATION SPLIT THICKNESS SKIN GRAFT;  Surgeon: Timothy Ford, MD;  Location: [facility] OR;  Service: Orthopedics;  Laterality: Left;   TOE SURGERY      Social History:  reports that she has never smoked. She has never used smokeless tobacco. She reports that she does not drink alcohol and does not  use drugs.   Allergies  Allergen Reactions   Ace Inhibitors Cough    Family History  Problem Relation Age of Onset   Heart failure Mother    Heart disease Mother    Diabetes Mother    Kidney disease Mother    Heart failure Father    Diabetes Father    Heart disease Father    Diabetes Brother    Heart failure Maternal Grandmother    Heart failure Maternal Grandfather    Transient ischemic attack Maternal Grandfather    Colon cancer Neg Hx      Prior to Admission medications   Medication Sig Start Date End Date Taking? Authorizing Provider  acetaminophen  (TYLENOL ) 325 MG tablet Take 2 tablets (650 mg total) by mouth every 6 (six) hours as needed for mild pain (pain score 1-3) (or Fever >/= 101). 03/04/24   Doroteo Gasmen, MD  acidophilus (RISAQUAD) CAPS capsule Take 2 capsules by mouth 3 (three) times daily. Patient taking differently: Take 1 capsule by mouth daily. 02/18/24   Justina Oman, MD  atorvastatin  (LIPITOR) 10 MG tablet Take 10 mg by mouth every evening.    [provider]  AURYXIA  1 GM 210 MG(Fe) tablet Take 630 mg by mouth 3 (three)  times daily with meals. 01/07/22   [provider]  doxycycline  (VIBRA -TABS) 100 MG tablet Take 1 tablet (100 mg total) by mouth daily. 04/12/24   Timothy Ford, MD  fidaxomicin  (DIFICID ) 200 MG TABS tablet Take 1 tablet (200 mg total) by mouth 2 (two) times daily with a meal. 03/14/24   Delman Ferns, NP  HUMALOG  KWIKPEN 100 UNIT/ML KwikPen Inject 5-11 Units into the skin 3 (three) times daily with meals. If eats 50% or more of meal. Patient taking differently: Inject 5-11 Units into the skin 3 (three) times daily with meals. If eats 50% or more of meal. Sliding scale 03/16/22   Nida, Gebreselassie W, MD  insulin  glargine (LANTUS ) 100 UNIT/ML injection Inject 0.08 mLs (8 Units total) into the skin at bedtime. 01/19/24   Ghimire, Estil Heman, MD  levothyroxine  (SYNTHROID ) 75 MCG tablet Take 75 mcg by mouth daily. 06/13/22    [provider]  midodrine  (PROAMATINE ) 10 MG tablet Take 1 tablet (10 mg total) by mouth 3 (three) times daily. 01/19/24   Ghimire, Estil Heman, MD  Nutritional Supplements (ENSURE PLANT-BASED PROTEIN) LIQD Take 1 Bottle by mouth 2 (two) times daily between meals. 03/20/24 04/19/24  Delman Ferns, NP  Vitamin D , Ergocalciferol , (DRISDOL ) 1.25 MG (50000 UNIT) CAPS capsule Take 50,000 Units by mouth every 7 (seven) days. Friday 04/20/21   [provider]    Physical Exam: BP (!) 101/51   Pulse 77   Temp (!) 97.5 F (36.4 C) (Oral)   Resp 17   Ht 5\' 6"  (1.676 m)   Wt 79.6 kg   LMP 08/22/2015 (Approximate)   SpO2 97%   BMI 28.32 kg/m   General: 53 y.o. year-old female ill appearing, but in no acute distress.  Alert and oriented x3. HEENT: NCAT, right eye blindness Neck: Supple, trachea medial Cardiovascular: Regular rate and Amy with no rubs or gallops.  No thyromegaly or JVD noted.  2/4 pulses in all 4 extremities. Respiratory: Clear to auscultation with no wheezes or rales. Good inspiratory effort. Abdomen: Soft, nontender nondistended with normal bowel sounds x4 quadrants. Muskuloskeletal: RLE swelling  > LLE. No cyanosis, clubbing noted bilaterally Neuro: CN II-XII intact, sensation, reflexes intact Skin: Right-sided sacral decubitus wound infection with foul-smelling purulent discharge. Psychiatry: Judgement and insight appear normal. Mood is appropriate for condition and setting               Labs on Admission:  Basic Metabolic Panel: Recent Labs  Lab 04/17/24 1635  NA 128*  K 4.8  CL 89*  CO2 20*  GLUCOSE 89  BUN 54*  CREATININE 9.03*  CALCIUM  9.1   Liver Function Tests: Recent Labs  Lab 04/17/24 1635  AST 10*  ALT 8  ALKPHOS 98  BILITOT 1.5*  PROT 6.7  ALBUMIN  3.1*   No results for input(s): "LIPASE", "AMYLASE" in the last 168 hours. No results for input(s): "AMMONIA" in the last 168 hours. CBC: Recent Labs  Lab 04/17/24 1635   WBC 20.4*  NEUTROABS 17.3*  HGB 11.2*  HCT 35.2*  MCV 89.1  PLT 143*   Cardiac Enzymes: No results for input(s): "CKTOTAL", "CKMB", "CKMBINDEX", "TROPONINI" in the last 168 hours.  BNP (last 3 results) No results for input(s): "BNP" in the last 8760 hours.  ProBNP (last 3 results) No results for input(s): "PROBNP" in the last 8760 hours.  CBG: No results for input(s): "GLUCAP" in the last 168 hours.  Radiological Exams on Admission: CT ABDOMEN PELVIS WO  CONTRAST Result Date: 04/17/2024 CLINICAL DATA:  Abdominal pain, acute, nonlocalized 23F when her home health nurse checked on her today, and Pt reports increasing weakness since Monday. EXAM: CT ABDOMEN AND PELVIS WITHOUT CONTRAST TECHNIQUE: Multidetector CT imaging of the abdomen and pelvis was performed following the standard protocol without IV contrast. RADIATION DOSE REDUCTION: This exam was performed according to the departmental dose-optimization program which includes automated exposure control, adjustment of the mA and/or kV according to patient size and/or use of iterative reconstruction technique. COMPARISON:  CT abdomen pelvis 02/16/2024. FINDINGS: Lower chest: At least small volume pericardial effusion. Cardiac changes suggestive of anemia. Coronary artery calcification. Hepatobiliary: No focal liver abnormality. Status post cholecystectomy. No biliary dilatation. Pancreas: No focal lesion. Normal pancreatic contour. No surrounding inflammatory changes. No main pancreatic ductal dilatation. Spleen: Normal in size without focal abnormality. Adrenals/Urinary Tract: No adrenal nodule bilaterally. Bilateral renal scarring. No nephrolithiasis and no hydronephrosis. No definite contour-deforming renal mass. No ureterolithiasis or hydroureter. The urinary bladder is decompressed. Stomach/Bowel: Stomach is within normal limits. No evidence of bowel wall thickening or dilatation. Appendix appears normal. Vascular/Lymphatic: No abdominal  aorta or iliac aneurysm. Extensive atherosclerotic plaque of the aorta and its branches. No abdominal, pelvic, or inguinal lymphadenopathy. Reproductive: Lobulated uterine contour with calcified and enhancing fundal mass suggestive of a degenerative uterine fibroid. Otherwise uterus and bilateral adnexa are unremarkable. Other: No intraperitoneal free fluid. No intraperitoneal free gas. No organized fluid collection. Musculoskeletal: No abdominal wall hernia or abnormality. Calcified granulomas of the gluteal soft tissues. No suspicious lytic or blastic osseous lesions. No acute displaced fracture. Multilevel degenerative changes of the spine. IMPRESSION: 1. No acute intra-abdominal or intrapelvic abnormality with limited evaluation on this noncontrast study. 2. Uterine fibroids. 3.  Aortic Atherosclerosis (ICD10-I70.0)-extensive. Electronically Signed   By: Morgane  Naveau M.D.   On: 04/17/2024 18:50   DG Chest Port 1 View Result Date: 04/17/2024 CLINICAL DATA:  Questionable sepsis - evaluate for abnormality EXAM: PORTABLE CHEST - 1 VIEW COMPARISON:  June 24, 2023 FINDINGS: No focal airspace consolidation, pleural effusion, or pneumothorax. Unchanged moderate cardiomegaly. Aortic atherosclerosis. No acute fracture or destructive lesion. IMPRESSION: Unchanged moderate cardiomegaly. Otherwise, no pneumonia or pulmonary edema. Electronically Signed   By: Rance Burrows M.D.   On: 04/17/2024 18:21    EKG: I independently viewed the EKG done and my findings are as followed: Normal sinus Amy at a rate of 81 bpm  Assessment/Plan Present on Admission:  Wound infection  Anemia of chronic disease  Metabolic acidosis, increased anion gap  Mixed hyperlipidemia  Acquired hypothyroidism  Chronic hypotension  Principal Problem:   Wound infection Active Problems:   Mixed hyperlipidemia   Acquired hypothyroidism   Anemia of chronic disease   Metabolic acidosis, increased anion gap   Chronic  hypotension   ESRD on hemodialysis (HCC)   Leukocytosis   Hyponatremia   Hypoalbuminemia due to protein-calorie malnutrition (HCC)   Insomnia  Wound infection This is due to right-sided decubitus ulcer infected wound Continue wound care Continue IV levofloxacin and metronidazole  Consider surgical consult for debridement  Leukocytosis possibly secondary to above WBC 20.4 Continue management as described above  Hyponatremia Na 128 This may be due to fluid overload considering that patient missed her dialysis yesterday This will be corrected during dialysis  Anemia of chronic disease This is possibly due to patient's history of ESRD Hemoglobin at 11.2 is within baseline range.  Hypoalbuminemia possibly secondary to mild protein calorie malnutrition Albumin  3.1, protein supplement will be provided  HAGMA  Anion gap 19; this is possibly due to patient's missed dialysis with elevated uremia This to be corrected with dialysis  ESRD on HD Last dialysis was on Saturday (5/24) Nephrology will be consulted for maintenance dialysis in the morning  Insomnia Continue melatonin.  Type 2 diabetes mellitus Hemoglobin A1c on 02/17/2024  was 5.6 indicating excellent blood glucose control Continue ISS and hypoglycemia protocol  Mixed hyperlipidemia Continue Lipitor  Acquired hypothyroidism Continue Synthroid   Chronic hypotension Continue midodrine   DVT prophylaxis: Subcu heparin   Code Status: Full code  Family Communication: None at bedside   Consults: Nephrology   Severity of Illness: The appropriate patient status for this patient is INPATIENT. Inpatient status is judged to be reasonable and necessary in order to provide the required intensity of service to ensure the patient's safety. The patient's presenting symptoms, physical exam findings, and initial radiographic and laboratory data in the context of their chronic comorbidities is felt to place them at high risk for  further clinical deterioration. Furthermore, it is not anticipated that the patient will be medically stable for discharge from the hospital within 2 midnights of admission.   * I certify that at the point of admission it is my clinical judgment that the patient will require inpatient hospital care spanning beyond 2 midnights from the point of admission due to high intensity of service, high risk for further deterioration and high frequency of surveillance required.*  Author: Tonianne Fine, DO 04/17/2024 10:21 PM  For on call review [email or web address].

## 2024-04-17 NOTE — ED Triage Notes (Signed)
 Pt arrived via POV from home c/o of a temp at home of 19F when her home health nurse checked on her today, and Pt reports increasing weakness since Monday. Pt also reports having a wound culture tested recently as well on her right buttocks. Pt reports she has been too weak to take her medications today. Pt does report recent diarrhea as well.

## 2024-04-17 NOTE — ED Notes (Signed)
 Unable to get 1 blood culture before starting antibiotics

## 2024-04-17 NOTE — ED Provider Notes (Signed)
 [place] EMERGENCY DEPARTMENT AT [facility] Provider Note   CSN: [id number] Arrival date & time: 04/17/24  1432     History {Add pertinent medical, surgical, social history, OB history to HPI:1} Chief Complaint  Patient presents with   Weakness    Amy Moses is a 53 y.o. female.  Patient has a history of diabetes hypertension congestive heart failure end-stage renal disease and C. difficile.  She states that she started with diarrhea on Saturday again and has been very fatigued.  Patient missed her dialysis yesterday   Weakness      Home Medications Prior to Admission medications   Medication Sig Start Date End Date Taking? Authorizing Provider  acetaminophen  (TYLENOL ) 325 MG tablet Take 2 tablets (650 mg total) by mouth every 6 (six) hours as needed for mild pain (pain score 1-3) (or Fever >/= 101). 03/04/24   Doroteo Gasmen, MD  acidophilus (RISAQUAD) CAPS capsule Take 2 capsules by mouth 3 (three) times daily. Patient taking differently: Take 1 capsule by mouth daily. 02/18/24   Justina Oman, MD  atorvastatin  (LIPITOR) 10 MG tablet Take 10 mg by mouth every evening.    [provider]  AURYXIA  1 GM 210 MG(Fe) tablet Take 630 mg by mouth 3 (three) times daily with meals. 01/07/22   [provider]  doxycycline  (VIBRA -TABS) 100 MG tablet Take 1 tablet (100 mg total) by mouth daily. 04/12/24   Timothy Ford, MD  fidaxomicin  (DIFICID ) 200 MG TABS tablet Take 1 tablet (200 mg total) by mouth 2 (two) times daily with a meal. 03/14/24   Delman Ferns, NP  HUMALOG  KWIKPEN 100 UNIT/ML KwikPen Inject 5-11 Units into the skin 3 (three) times daily with meals. If eats 50% or more of meal. Patient taking differently: Inject 5-11 Units into the skin 3 (three) times daily with meals. If eats 50% or more of meal. Sliding scale 03/16/22   Nida, Gebreselassie W, MD  insulin  glargine (LANTUS ) 100 UNIT/ML injection Inject 0.08 mLs (8 Units total) into the  skin at bedtime. 01/19/24   Ghimire, Estil Heman, MD  levothyroxine  (SYNTHROID ) 75 MCG tablet Take 75 mcg by mouth daily. 06/13/22   [provider]  midodrine  (PROAMATINE ) 10 MG tablet Take 1 tablet (10 mg total) by mouth 3 (three) times daily. 01/19/24   Ghimire, Estil Heman, MD  Nutritional Supplements (ENSURE PLANT-BASED PROTEIN) LIQD Take 1 Bottle by mouth 2 (two) times daily between meals. 03/20/24 04/19/24  Delman Ferns, NP  Vitamin D , Ergocalciferol , (DRISDOL ) 1.25 MG (50000 UNIT) CAPS capsule Take 50,000 Units by mouth every 7 (seven) days. Friday 04/20/21   [provider]      Allergies    Ace inhibitors    Review of Systems   Review of Systems  Neurological:  Positive for weakness.    Physical Exam Updated Vital Signs BP (!) 90/50   Pulse 75   Temp 98.2 F (36.8 C) (Temporal)   Resp 14   Ht 5\' 6"  (1.676 m)   Wt 77.1 kg   LMP 08/22/2015 (Approximate)   SpO2 95%   BMI 27.44 kg/m  Physical Exam  ED Results / Procedures / Treatments   Labs (all labs ordered are listed, but only abnormal results are displayed) Labs Reviewed  CBC WITH DIFFERENTIAL/PLATELET - Abnormal; Notable for the following components:      Result Value   WBC 20.4 (*)    Hemoglobin 11.2 (*)    HCT 35.2 (*)  Platelets 143 (*)    Neutro Abs 17.3 (*)    Lymphs Abs 0.5 (*)    Monocytes Absolute 1.6 (*)    Abs Immature Granulocytes 0.80 (*)    All other components within normal limits  COMPREHENSIVE METABOLIC PANEL WITH GFR - Abnormal; Notable for the following components:   Sodium 128 (*)    Chloride 89 (*)    CO2 20 (*)    BUN 54 (*)    Creatinine, Ser 9.03 (*)    Albumin  3.1 (*)    AST 10 (*)    Total Bilirubin 1.5 (*)    GFR, Estimated 5 (*)    Anion gap 19 (*)    All other components within normal limits  PROTIME-INR - Abnormal; Notable for the following components:   Prothrombin Time 16.5 (*)    INR 1.3 (*)    All other components within normal limits  RESP PANEL BY  RT-PCR (RSV, FLU A&B, COVID)  RVPGX2  CULTURE, BLOOD (ROUTINE X 2)  CULTURE, BLOOD (ROUTINE X 2)  C DIFFICILE QUICK SCREEN W PCR REFLEX    GASTROINTESTINAL PANEL BY PCR, STOOL (REPLACES STOOL CULTURE)  LACTIC ACID, PLASMA  URINALYSIS, ROUTINE W REFLEX MICROSCOPIC  I-STAT CHEM 8, ED    EKG None  Radiology CT ABDOMEN PELVIS WO CONTRAST Result Date: 04/17/2024 CLINICAL DATA:  Abdominal pain, acute, nonlocalized 87F when her home health nurse checked on her today, and Pt reports increasing weakness since Monday. EXAM: CT ABDOMEN AND PELVIS WITHOUT CONTRAST TECHNIQUE: Multidetector CT imaging of the abdomen and pelvis was performed following the standard protocol without IV contrast. RADIATION DOSE REDUCTION: This exam was performed according to the departmental dose-optimization program which includes automated exposure control, adjustment of the mA and/or kV according to patient size and/or use of iterative reconstruction technique. COMPARISON:  CT abdomen pelvis 02/16/2024. FINDINGS: Lower chest: At least small volume pericardial effusion. Cardiac changes suggestive of anemia. Coronary artery calcification. Hepatobiliary: No focal liver abnormality. Status post cholecystectomy. No biliary dilatation. Pancreas: No focal lesion. Normal pancreatic contour. No surrounding inflammatory changes. No main pancreatic ductal dilatation. Spleen: Normal in size without focal abnormality. Adrenals/Urinary Tract: No adrenal nodule bilaterally. Bilateral renal scarring. No nephrolithiasis and no hydronephrosis. No definite contour-deforming renal mass. No ureterolithiasis or hydroureter. The urinary bladder is decompressed. Stomach/Bowel: Stomach is within normal limits. No evidence of bowel wall thickening or dilatation. Appendix appears normal. Vascular/Lymphatic: No abdominal aorta or iliac aneurysm. Extensive atherosclerotic plaque of the aorta and its branches. No abdominal, pelvic, or inguinal lymphadenopathy.  Reproductive: Lobulated uterine contour with calcified and enhancing fundal mass suggestive of a degenerative uterine fibroid. Otherwise uterus and bilateral adnexa are unremarkable. Other: No intraperitoneal free fluid. No intraperitoneal free gas. No organized fluid collection. Musculoskeletal: No abdominal wall hernia or abnormality. Calcified granulomas of the gluteal soft tissues. No suspicious lytic or blastic osseous lesions. No acute displaced fracture. Multilevel degenerative changes of the spine. IMPRESSION: 1. No acute intra-abdominal or intrapelvic abnormality with limited evaluation on this noncontrast study. 2. Uterine fibroids. 3.  Aortic Atherosclerosis (ICD10-I70.0)-extensive. Electronically Signed   By: Morgane  Naveau M.D.   On: 04/17/2024 18:50   DG Chest Port 1 View Result Date: 04/17/2024 CLINICAL DATA:  Questionable sepsis - evaluate for abnormality EXAM: PORTABLE CHEST - 1 VIEW COMPARISON:  June 24, 2023 FINDINGS: No focal airspace consolidation, pleural effusion, or pneumothorax. Unchanged moderate cardiomegaly. Aortic atherosclerosis. No acute fracture or destructive lesion. IMPRESSION: Unchanged moderate cardiomegaly. Otherwise, no pneumonia or pulmonary  edema. Electronically Signed   By: Rance Burrows M.D.   On: 04/17/2024 18:21    Procedures Procedures  {Document cardiac monitor, telemetry assessment procedure when appropriate:1}  Medications Ordered in ED Medications  metroNIDAZOLE  (FLAGYL ) IVPB 500 mg (has no administration in time range)  cefTRIAXone  (ROCEPHIN ) 2 g in sodium chloride  0.9 % 100 mL IVPB (0 g Intravenous Stopped 04/17/24 1856)  sodium chloride  0.9 % bolus 500 mL (0 mLs Intravenous Stopped 04/17/24 1856)  sodium chloride  0.9 % bolus 500 mL (0 mLs Intravenous Stopped 04/17/24 1940)  midodrine  (PROAMATINE ) tablet 10 mg (10 mg Oral Given 04/17/24 1909)    ED Course/ Medical Decision Making/ A&P   {  CRITICAL CARE Performed by: Cheyenne Cotta Total  critical care time: 45 minutes Critical care time was exclusive of separately billable procedures and treating other patients. Critical care was necessary to treat or prevent imminent or life-threatening deterioration. Critical care was time spent personally by me on the following activities: development of treatment plan with patient and/or surrogate as well as nursing, discussions with consultants, evaluation of patient's response to treatment, examination of patient, obtaining history from patient or surrogate, ordering and performing treatments and interventions, ordering and review of laboratory studies, ordering and review of radiographic studies, pulse oximetry and re-evaluation of patient's condition.  Click here for ABCD2, HEART and other calculatorsREFRESH Note before signing :1}                              Medical Decision Making Amount and/or Complexity of Data Reviewed Labs: ordered. Radiology: ordered.  Risk Prescription drug management. Decision regarding hospitalization.   Acute sepsis, possibly secondary to C. difficile or decubitus  {Document critical care time when appropriate:1} {Document review of labs and clinical decision tools ie heart score, Chads2Vasc2 etc:1}  {Document your independent review of radiology images, and any outside records:1} {Document your discussion with family members, caretakers, and with consultants:1} {Document social determinants of health affecting pt's care:1} {Document your decision making why or why not admission, treatments were needed:1} Final Clinical Impression(s) / ED Diagnoses Final diagnoses:  Acute sepsis (HCC)    Rx / DC Orders ED Discharge Orders     None

## 2024-04-17 NOTE — Consult Note (Signed)
 CODE SEPSIS - PHARMACY COMMUNICATION  **Broad-spectrum antimicrobials should be administered within one hour of sepsis diagnosis**  Time Code Sepsis call or page was received: 1703  Antibiotics ordered: Ceftriaxone   Time of first antibiotic administration: 1800  Additional action taken by pharmacy: Messaged RN    Will M. Alva Jewels, PharmD Clinical Pharmacist 04/17/2024 5:10 PM

## 2024-04-17 NOTE — ED Triage Notes (Signed)
 Pt also reports she missed her dialysis appointment yesterday and is unsure if she is strong enough to make it to her appointment tomorrow.

## 2024-04-18 DIAGNOSIS — A0472 Enterocolitis due to Clostridium difficile, not specified as recurrent: Secondary | ICD-10-CM | POA: Diagnosis present

## 2024-04-18 DIAGNOSIS — E440 Moderate protein-calorie malnutrition: Secondary | ICD-10-CM | POA: Diagnosis present

## 2024-04-18 LAB — PHOSPHORUS: Phosphorus: 7.7 mg/dL — ABNORMAL HIGH (ref 2.5–4.6)

## 2024-04-18 LAB — C DIFFICILE QUICK SCREEN W PCR REFLEX
C Diff antigen: POSITIVE — AB
C Diff interpretation: DETECTED
C Diff toxin: POSITIVE — AB

## 2024-04-18 LAB — COMPREHENSIVE METABOLIC PANEL WITH GFR
ALT: 8 U/L (ref 0–44)
AST: 10 U/L — ABNORMAL LOW (ref 15–41)
Albumin: 2.5 g/dL — ABNORMAL LOW (ref 3.5–5.0)
Alkaline Phosphatase: 95 U/L (ref 38–126)
Anion gap: 15 (ref 5–15)
BUN: 60 mg/dL — ABNORMAL HIGH (ref 6–20)
CO2: 21 mmol/L — ABNORMAL LOW (ref 22–32)
Calcium: 8.6 mg/dL — ABNORMAL LOW (ref 8.9–10.3)
Chloride: 93 mmol/L — ABNORMAL LOW (ref 98–111)
Creatinine, Ser: 8.84 mg/dL — ABNORMAL HIGH (ref 0.44–1.00)
GFR, Estimated: 5 mL/min — ABNORMAL LOW (ref >60)
Glucose, Bld: 124 mg/dL — ABNORMAL HIGH (ref 70–99)
Potassium: 5 mmol/L (ref 3.5–5.1)
Sodium: 129 mmol/L — ABNORMAL LOW (ref 135–145)
Total Bilirubin: 0.9 mg/dL (ref 0.0–1.2)
Total Protein: 5.6 g/dL — ABNORMAL LOW (ref 6.5–8.1)

## 2024-04-18 LAB — MAGNESIUM: Magnesium: 1.9 mg/dL (ref 1.7–2.4)

## 2024-04-18 LAB — CBC
HCT: 29.7 % — ABNORMAL LOW (ref 36.0–46.0)
Hemoglobin: 9.5 g/dL — ABNORMAL LOW (ref 12.0–15.0)
MCH: 28.1 pg (ref 26.0–34.0)
MCHC: 32 g/dL (ref 30.0–36.0)
MCV: 87.9 fL (ref 80.0–100.0)
Platelets: 128 10*3/uL — ABNORMAL LOW (ref 150–400)
RBC: 3.38 MIL/uL — ABNORMAL LOW (ref 3.87–5.11)
RDW: 13.7 % (ref 11.5–15.5)
WBC: 19.3 10*3/uL — ABNORMAL HIGH (ref 4.0–10.5)
nRBC: 0 % (ref 0.0–0.2)

## 2024-04-18 LAB — POCT I-STAT, CHEM 8
BUN: 47 mg/dL — ABNORMAL HIGH (ref 6–20)
Calcium, Ion: 1.1 mmol/L — ABNORMAL LOW (ref 1.15–1.40)
Chloride: 95 mmol/L — ABNORMAL LOW (ref 98–111)
Creatinine, Ser: 9.4 mg/dL — ABNORMAL HIGH (ref 0.44–1.00)
Glucose, Bld: 94 mg/dL (ref 70–99)
HCT: 36 % (ref 36.0–46.0)
Hemoglobin: 12.2 g/dL (ref 12.0–15.0)
Potassium: 4.6 mmol/L (ref 3.5–5.1)
Sodium: 128 mmol/L — ABNORMAL LOW (ref 135–145)
TCO2: 21 mmol/L — ABNORMAL LOW (ref 22–32)

## 2024-04-18 LAB — MRSA NEXT GEN BY PCR, NASAL: MRSA by PCR Next Gen: NOT DETECTED

## 2024-04-18 LAB — GLUCOSE, CAPILLARY
Glucose-Capillary: 79 mg/dL (ref 70–99)
Glucose-Capillary: 118 mg/dL — ABNORMAL HIGH (ref 70–99)
Glucose-Capillary: 162 mg/dL — ABNORMAL HIGH (ref 70–99)
Glucose-Capillary: 196 mg/dL — ABNORMAL HIGH (ref 70–99)
Glucose-Capillary: 245 mg/dL — ABNORMAL HIGH (ref 70–99)

## 2024-04-18 LAB — HEPATITIS B SURFACE ANTIGEN: Hepatitis B Surface Ag: NONREACTIVE

## 2024-04-18 MED ORDER — ZINC SULFATE 220 (50 ZN) MG PO CAPS
220.0000 mg | ORAL_CAPSULE | Freq: Every day | ORAL | Status: DC
  Administered 2024-04-18 – 2024-04-19 (×2): 220 mg via ORAL
  Filled 2024-04-18 (×2): qty 1

## 2024-04-18 MED ORDER — VANCOMYCIN HCL 125 MG PO CAPS
125.0000 mg | ORAL_CAPSULE | Freq: Four times a day (QID) | ORAL | Status: DC
  Administered 2024-04-18 – 2024-04-19 (×4): 125 mg via ORAL
  Filled 2024-04-18 (×9): qty 1

## 2024-04-18 MED ORDER — RENA-VITE PO TABS
1.0000 | ORAL_TABLET | Freq: Every day | ORAL | Status: DC
  Administered 2024-04-18: 1 via ORAL
  Filled 2024-04-18: qty 1

## 2024-04-18 MED ORDER — BOOST / RESOURCE BREEZE PO LIQD CUSTOM
1.0000 | Freq: Three times a day (TID) | ORAL | Status: DC
  Administered 2024-04-18 – 2024-04-19 (×3): 1 via ORAL

## 2024-04-18 MED ORDER — PENTAFLUOROPROP-TETRAFLUOROETH EX AERO: 1.0000 | INHALATION_SPRAY | CUTANEOUS | Status: DC | PRN

## 2024-04-18 MED ORDER — MEDIHONEY WOUND/BURN DRESSING EX PSTE
1.0000 | PASTE | Freq: Every day | CUTANEOUS | Status: DC
  Administered 2024-04-18: 1 via TOPICAL
  Filled 2024-04-18 (×2): qty 44

## 2024-04-18 MED ORDER — SODIUM CHLORIDE 0.9 % IV SOLN
2.0000 g | INTRAVENOUS | Status: AC
  Administered 2024-04-18: 2 g via INTRAVENOUS
  Filled 2024-04-18: qty 20

## 2024-04-18 MED ORDER — ORAL CARE MOUTH RINSE: 15.0000 mL | OROMUCOSAL | Status: DC | PRN

## 2024-04-18 MED ORDER — FERRIC CITRATE 1 GM 210 MG(FE) PO TABS
630.0000 mg | ORAL_TABLET | Freq: Three times a day (TID) | ORAL | Status: DC
  Administered 2024-04-18 – 2024-04-19 (×3): 630 mg via ORAL
  Filled 2024-04-18 (×7): qty 3

## 2024-04-18 MED ORDER — CHLORHEXIDINE GLUCONATE CLOTH 2 % EX PADS
6.0000 | MEDICATED_PAD | Freq: Every day | CUTANEOUS | Status: DC
  Administered 2024-04-18 – 2024-04-19 (×2): 6 via TOPICAL

## 2024-04-18 MED ORDER — VITAMIN C 500 MG PO TABS
500.0000 mg | ORAL_TABLET | Freq: Every day | ORAL | Status: DC
  Administered 2024-04-18 – 2024-04-19 (×2): 500 mg via ORAL
  Filled 2024-04-18 (×2): qty 1

## 2024-04-18 MED ORDER — HYDROCERIN EX CREA
TOPICAL_CREAM | Freq: Two times a day (BID) | CUTANEOUS | Status: DC
  Administered 2024-04-18: 1 via TOPICAL
  Filled 2024-04-18: qty 113

## 2024-04-18 MED ORDER — PROSOURCE PLUS PO LIQD
30.0000 mL | Freq: Two times a day (BID) | ORAL | Status: DC
  Administered 2024-04-18 – 2024-04-19 (×3): 30 mL via ORAL
  Filled 2024-04-18 (×3): qty 30

## 2024-04-18 NOTE — Plan of Care (Signed)
  Problem: Education: Goal: Knowledge of General Education information will improve Description: Including pain rating scale, medication(s)/side effects and non-pharmacologic comfort measures Outcome: Not Progressing   Problem: Health Behavior/Discharge Planning: Goal: Ability to manage health-related needs will improve Outcome: Not Progressing   Problem: Clinical Measurements: Goal: Ability to maintain clinical measurements within normal limits will improve Outcome: Not Progressing Goal: Will remain free from infection Outcome: Not Progressing Goal: Diagnostic test results will improve Outcome: Not Progressing Goal: Respiratory complications will improve Outcome: Not Progressing Goal: Cardiovascular complication will be avoided Outcome: Not Progressing   Problem: Activity: Goal: Risk for activity intolerance will decrease Outcome: Not Progressing   Problem: Nutrition: Goal: Adequate nutrition will be maintained Outcome: Not Progressing   Problem: Coping: Goal: Level of anxiety will decrease Outcome: Not Progressing   Problem: Elimination: Goal: Will not experience complications related to bowel motility Outcome: Not Progressing Goal: Will not experience complications related to urinary retention Outcome: Not Progressing   Problem: Pain Managment: Goal: General experience of comfort will improve and/or be controlled Outcome: Progressing   Problem: Safety: Goal: Ability to remain free from injury will improve Outcome: Not Progressing   Problem: Skin Integrity: Goal: Risk for impaired skin integrity will decrease Outcome: Not Progressing   Problem: Education: Goal: Ability to describe self-care measures that may prevent or decrease complications (Diabetes Survival Skills Education) will improve Outcome: Not Progressing Goal: Individualized Educational Video(s) Outcome: Not Progressing  Problem: Coping: Goal: Ability to adjust to condition or change in health  will improve Outcome: Not Progressing   Problem: Fluid Volume: Goal: Ability to maintain a balanced intake and output will improve Outcome: Not Progressing   Problem: Health Behavior/Discharge Planning: Goal: Ability to identify and utilize available resources and services will improve Outcome: Not Progressing Goal: Ability to manage health-related needs will improve Outcome: Not Progressing   Problem: Metabolic: Goal: Ability to maintain appropriate glucose levels will improve Outcome: Not Progressing   Problem: Nutritional: Goal: Maintenance of adequate nutrition will improve Outcome: Not Progressing Goal: Progress toward achieving an optimal weight will improve Outcome: Not Progressing   Problem: Skin Integrity: Goal: Risk for impaired skin integrity will decrease Outcome: Not Progressing   Problem: Tissue Perfusion: Goal: Adequacy of tissue perfusion will improve Outcome: Not Progressing   Problem: Education: Goal: Knowledge of disease and its progression will improve Outcome: Not Progressing Goal: Individualized Educational Video(s) Outcome: Not Progressing   Problem: Fluid Volume: Goal: Compliance with measures to maintain balanced fluid volume will improve Outcome: Not Progressing   Problem: Health Behavior/Discharge Planning: Goal: Ability to manage health-related needs will improve Outcome: Not Progressing   Problem: Nutritional: Goal: Ability to make healthy dietary choices will improve Outcome: Not Progressing   Problem: Clinical Measurements: Goal: Complications related to the disease process, condition or treatment will be avoided or minimized Outcome: Not Progressing Patient has multiple wounds WC bound blind in one and and has trouble seeing in other eye. Patient lives home alone with home health care 3 days a week patient positive for C-diff decrease appetite

## 2024-04-18 NOTE — Progress Notes (Signed)
   HEMODIALYSIS TREATMENT NOTE:  Uneventful 3.25 hour heparin -free treatment completed using left upper arm AVF (15g/antegrade).  No access issues.  No ultrafiltration, as per order.  Hemodynamically stable.  Still c/o nausea, some dry-heaving. All blood was returned.  Hemostasis was achieved in 15 minutes.  Meds given: none.  Post-HD:  04/18/24 1500  Vital Signs  Temp 98.3 F (36.8 C)  Temp Source Oral  Pulse Rate 74  Pulse Rate Source Monitor  Resp (!) 21  BP (!) 106/46  BP Location Right Arm  BP Method Automatic  Patient Position (if appropriate) Lying  Oxygen Therapy  SpO2 97 %  O2 Device Room Air  Pain Assessment  Pain Score 0  Dialysis Weight  Weight 78.7 kg  Type of Weight Post-Dialysis  Post Treatment  Dialyzer Clearance Lightly streaked  Hemodialysis Intake (mL) 0 mL  Liters Processed 78.3  Fluid Removed (mL) 0 mL  Tolerated HD Treatment Yes  Post-Hemodialysis Comments Kept even, as per order.  Hemodynamically stable  AVG/AVF Arterial Site Held (minutes) 7 minutes  AVG/AVF Venous Site Held (minutes) 7 minutes  Fistula / Graft Left Upper arm Arteriovenous fistula  No placement date or time found.   Placed prior to admission: Yes  Orientation: Left  Access Location: Upper arm  Access Type: Arteriovenous fistula  Fistula / Graft Assessment Thrill;Bruit  Status Patent    Waunita Haff, RN AP KDU

## 2024-04-18 NOTE — Consult Note (Addendum)
 ESRD Consult Note  Requesting provider: Courage Emokpae Service requesting consult: Hospitalist Reason for consult: ESRD, provision of dialysis Indication for acute dialysis?: End Stage Renal Disease  Outpatient dialysis unit: [name] Outpatient dialysis prescription: TTS [name], [place] 3h   2K bath  B400   LUA AVF Heparin  none  Assessment/Recommendations: Amy Moses is a/an 53 y.o. female with a past medical history notable for ESRD on HD admitted with sacral wound infection.   # ESRD: Continue dialysis today per TTS schedule  # Volume/ hypotension: Says she has been losing weight steadily. Plan for no UF on HD today. Continue home midodrine   # Anemia of Chronic Kidney Disease: Hemoglobin 9.5. Hold on iron  given infection. Consider ESA  # Secondary Hyperparathyroidism/Hyperphosphatemia: 8.6 corrects to normal when accounting for albumin .  Phos elevated.  Continue home binders.  # Vascular access: AVF with no issues.  # Sacral wound: Wound care and antibiotics per primary team  # Additional recommendations: - Dose all meds for creatinine clearance < 10 ml/min  - Unless absolutely necessary, no MRIs with gadolinium.  - Implement save arm precautions.  Prefer needle sticks in the dorsum of the hands or wrists.  No blood pressure measurements in arm. - If blood transfusion is requested during hemodialysis sessions, please alert us  prior to the session.  - Use synthetic opioids (Fentanyl /Dilaudid ) if needed  Recommendations were discussed with the primary team.   History of Present Illness: Amy Moses is a/an 53 y.o. female with a past medical history of ESRD who presents with weakness  Patient presented to the hospital yesterday with worsening weakness.  States she has been having weakness ever since Monday.  Because of this weakness she did not go to dialysis on Saturday.  She has chronic hypotension but notes that it has been worse recently.   Continues to take midodrine .  Does have some dizziness and lightheadedness.  Denies nausea, vomiting, fevers, chills, chest pain, shortness of breath.  Had some diarrhea on Saturday but that now has resolved.  She also has poor appetite and has not wanted to eat much.  Does not think she has much fluid on at all.  In the emergency department labs mostly consistent with chronic kidney disease.  There was concerned about a wound infection on her sacrum and she was admitted for further management.   Medications:  Current Facility-Administered Medications  Medication Dose Route Frequency Provider Last Rate Last Admin   acetaminophen  (TYLENOL ) tablet 650 mg  650 mg Oral Q6H PRN Adefeso, Oladapo, DO       Or   acetaminophen  (TYLENOL ) suppository 650 mg  650 mg Rectal Q6H PRN Adefeso, Oladapo, DO       atorvastatin  (LIPITOR) tablet 10 mg  10 mg Oral QPM Adefeso, Oladapo, DO       Chlorhexidine  Gluconate Cloth 2 % PADS 6 each  6 each Topical Q0600 Adefeso, Oladapo, DO   6 each at 04/17/24 2051   feeding supplement (GLUCERNA SHAKE) (GLUCERNA SHAKE) liquid 237 mL  237 mL Oral TID BM Adefeso, Oladapo, DO       heparin  injection 5,000 Units  5,000 Units Subcutaneous Q8H Adefeso, Oladapo, DO   5,000 Units at 04/18/24 1610   hydrocerin (EUCERIN) cream   Topical BID Emokpae, Courage, MD       insulin  aspart (novoLOG ) injection 0-6 Units  0-6 Units Subcutaneous TID WC Adefeso, Oladapo, DO       leptospermum manuka honey (MEDIHONEY) paste 1 Application  1 Application  Topical Daily Adefeso, Oladapo, DO       [START ON 04/19/2024] levofloxacin  (LEVAQUIN ) IVPB 500 mg  500 mg Intravenous Q48H Adefeso, Oladapo, DO       levothyroxine  (SYNTHROID ) tablet 75 mcg  75 mcg Oral Q0600 Adefeso, Oladapo, DO   75 mcg at 04/18/24 4098   melatonin tablet 6 mg  6 mg Oral QHS PRN Adefeso, Oladapo, DO       metroNIDAZOLE  (FLAGYL ) IVPB 500 mg  500 mg Intravenous Q12H Adefeso, Oladapo, DO 100 mL/hr at 04/18/24 0805 500 mg at 04/18/24  0805   midodrine  (PROAMATINE ) tablet 10 mg  10 mg Oral TID with meals Adefeso, Oladapo, DO   10 mg at 04/18/24 0801   ondansetron  (ZOFRAN ) tablet 4 mg  4 mg Oral Q6H PRN Adefeso, Oladapo, DO       Or   ondansetron  (ZOFRAN ) injection 4 mg  4 mg Intravenous Q6H PRN Adefeso, Oladapo, DO       Oral care mouth rinse  15 mL Mouth Rinse PRN Adefeso, Oladapo, DO       sodium chloride  0.9 % bolus 500 mL  500 mL Intravenous Once Adefeso, Oladapo, DO         ALLERGIES Ace inhibitors  MEDICAL HISTORY Past Medical History:  Diagnosis Date   Anemia    Blindness of right eye with low vision in contralateral eye    s/p victrectomy   Chronic diastolic heart failure (HCC) 03/30/2021   Diabetes mellitus, type II (HCC)    Dyslipidemia    ESRD on hemodialysis (HCC)    TTS in [place] , [place]   Glaucoma    History of blood transfusion    Hypertension    Hypothyroidism (acquired)    Mitral regurgitation 11/16/2022   Pneumonia    Pulmonary hypertension, unspecified (HCC) 09/12/2022     SOCIAL HISTORY Social History   Socioeconomic History   Marital status: Single    Spouse name: Not on file   Number of children: Not on file   Years of education: Not on file   Highest education level: Not on file  Occupational History   Not on file  Tobacco Use   Smoking status: Never   Smokeless tobacco: Never  Vaping Use   Vaping status: Never Used  Substance and Sexual Activity   Alcohol use: No   Drug use: No   Sexual activity: Yes    Birth control/protection: Condom  Other Topics Concern   Not on file  Social History Narrative   Not on file   Social Drivers of Health   Financial Resource Strain: Not on file  Food Insecurity: No Food Insecurity (04/17/2024)   Hunger Vital Sign    Worried About Running Out of Food in the Last Year: Never true    Ran Out of Food in the Last Year: Never true  Transportation Needs: No Transportation Needs (04/17/2024)   PRAPARE - Transportation    Lack of  Transportation (Medical): No    Lack of Transportation (Non-Medical): No  Physical Activity: Not on file  Stress: Not on file  Social Connections: Not on file  Intimate Partner Violence: Not At Risk (04/17/2024)   Humiliation, Afraid, Rape, and Kick questionnaire    Fear of Current or Ex-Partner: No    Emotionally Abused: No    Physically Abused: No    Sexually Abused: No     FAMILY HISTORY Family History  Problem Relation Age of Onset   Heart failure Mother  Heart disease Mother    Diabetes Mother    Kidney disease Mother    Heart failure Father    Diabetes Father    Heart disease Father    Diabetes Brother    Heart failure Maternal Grandmother    Heart failure Maternal Grandfather    Transient ischemic attack Maternal Grandfather    Colon cancer Neg Hx      Review of Systems: 12 systems were reviewed and negative except per HPI  Physical Exam: Vitals:   04/18/24 0700 04/18/24 0800  BP: (!) 87/43 (!) 90/43  Pulse: 71 75  Resp: 19 17  Temp:  98.3 F (36.8 C)  SpO2: 98% 100%   No intake/output data recorded. No intake or output data in the 24 hours ending 04/18/24 0852 General: Chronically ill-appearing, lying in bed, no distress HEENT: anicteric sclera, MMM CV: normal rate, no murmurs, bilateral lower extremities in boots with trace edema Lungs: bilateral chest rise, normal wob Abd: soft, non-tender, non-distended Skin: no visible lesions or rashes Psych: alert, engaged, appropriate mood and affect Neuro: normal speech, no gross focal deficits   Test Results Reviewed Lab Results  Component Value Date   NA 129 (L) 04/18/2024   K 5.0 04/18/2024   CL 93 (L) 04/18/2024   CO2 21 (L) 04/18/2024   BUN 60 (H) 04/18/2024   CREATININE 8.84 (H) 04/18/2024   GLU 420 12/30/2021   CALCIUM  8.6 (L) 04/18/2024   ALBUMIN  2.5 (L) 04/18/2024   PHOS 7.7 (H) 04/18/2024    I have reviewed relevant outside healthcare records

## 2024-04-18 NOTE — TOC Initial Note (Signed)
 Transition of Care [facility]) - Initial/Assessment Note    Patient Details  Name: Amy Moses MRN: [id number] Date of Birth: 01/31/1971  Transition of Care [facility]) CM/SW Contact:    Amy Moses, LCSWA Phone Number: 04/18/2024, 12:54 PM  Clinical Narrative:                 Pt is high risk for readmission. CSW spoke with pt to complete assessment. Pt lives with her significant other and has family assistance as needed for completing ADLs. Pt has a cPAP, wheelchair, power wheelchair, hospital bed, 3N1, and a tub bench. Pt has HH RN wound care at home through [facility], will need new orders. Pt has outpatient HD [facility] transport and has transportation when needed. TOC to follow.   Expected Discharge Plan: Home w Home Health Services Barriers to Discharge: Continued Medical Work up   Patient Goals and CMS Choice Patient states their goals for this hospitalization and ongoing recovery are:: return home CMS Medicare.gov Compare Post Acute Care list provided to:: Patient Choice offered to / list presented to : Patient      Expected Discharge Plan and Services In-house Referral: Clinical Social Work Discharge Planning Services: CM Consult Post Acute Care Choice: Home Health Living arrangements for the past 2 months: Single Family Home                                      Prior Living Arrangements/Services Living arrangements for the past 2 months: Single Family Home Lives with:: Significant Other Patient language and need for interpreter reviewed:: Yes Do you feel safe going back to the place where you live?: Yes      Need for Family Participation in Patient Care: Yes (Comment) Care giver support system in place?: Yes (comment) Current home services: DME, Home RN Criminal Activity/Legal Involvement Pertinent to Current Situation/Hospitalization: No - Comment as needed  Activities of Daily Living   ADL Screening (condition at time of admission) Independently  performs ADLs?: Yes (appropriate for developmental age) Is the patient deaf or have difficulty hearing?: No Does the patient have difficulty seeing, even when wearing glasses/contacts?: No Does the patient have difficulty concentrating, remembering, or making decisions?: No  Permission Sought/Granted                  Emotional Assessment Appearance:: Appears stated age Attitude/Demeanor/Rapport: Engaged Affect (typically observed): Accepting Orientation: : Oriented to Self, Oriented to Place, Oriented to  Time, Oriented to Situation Alcohol / Substance Use: Not Applicable Psych Involvement: No (comment)  Admission diagnosis:  Wound infection [T14.8XXA, L08.9] Acute sepsis [facility]) [A41.9] Patient Active Problem List   Diagnosis Date Noted   Malnutrition of moderate degree 04/18/2024   Leukocytosis 04/17/2024   Hyponatremia 04/17/2024   Hypoalbuminemia due to protein-calorie malnutrition (HCC) 04/17/2024   Insomnia 04/17/2024   Cellulitis of right ankle 03/02/2024   Recurrent Clostridioides difficile infection 02/21/2024   ESRD on hemodialysis (HCC) 02/16/2024   Skin ulcer of right heel, limited to breakdown of skin (HCC) 01/15/2024   Nonhealing ulcer of heel (HCC) 01/13/2024   Shoulder pain 01/13/2024   Chronic hypotension 01/13/2024   Anemia due to chronic kidney disease 11/01/2023   Clostridioides difficile diarrhea 10/30/2023   Rash 10/30/2023   Diarrhea 10/24/2023   Non-pressure chronic ulcer of other part of right foot limited to breakdown of skin (HCC) 08/08/2023   Peripheral vascular disease (HCC) 08/08/2023  Acute osteomyelitis of right foot (HCC) 08/04/2023   OSA (obstructive sleep apnea) 08/02/2023   Prolapsed internal hemorrhoids, grade 3 03/29/2023   Thrombocytopenia (HCC) 03/21/2023   Mitral regurgitation 11/16/2022   Pressure injury of skin 10/01/2022   Pulmonary hypertension, unspecified (HCC) 09/12/2022   GERD (gastroesophageal reflux disease)  04/30/2022   Non-adherence to medical treatment 03/16/2022   Cutaneous abscess of right foot    Osteomyelitis of foot (HCC) 02/14/2022   Obesity (BMI 30-39.9) 01/26/2022   Ischemic ulcer of right foot (HCC) 01/24/2022   Anemia of chronic kidney failure, stage 5 (HCC) 01/24/2022   Sacral pressure ulcer 01/24/2022   Metabolic acidosis, increased anion gap 01/24/2022   Leg wound, left, sequela 12/10/2021   Open leg wound 09/03/2021   Wound infection    Non-pressure chronic ulcer of right calf limited to breakdown of skin (HCC)    Calciphylaxis of right lower extremity with nonhealing ulcer, limited to breakdown of skin (HCC)    Chronic ulcer of left thigh (HCC) 08/07/2021   Calciphylaxis of left lower extremity with nonhealing ulcer with necrosis of muscle (HCC) 08/07/2021   Chronic diastolic CHF (congestive heart failure) (HCC) 03/30/2021   ESRD on dialysis (HCC) 11/16/2020   Calciphylaxis 11/06/2020   Non-healing open wound of heel 11/03/2020   Diabetic foot infection (HCC) 11/01/2020   Decubitus ulcer, heel 11/01/2020   Closed nondisplaced fracture of left patella 10/29/2020   Acute on chronic renal failure (HCC) 06/10/2020   Anemia of chronic disease 06/10/2020   Vitamin D  deficiency 01/28/2019   Insulin  dependent type 2 diabetes mellitus (HCC) 09/21/2015   Mixed hyperlipidemia 09/21/2015   Primary hypertension 09/21/2015   Acquired hypothyroidism 09/21/2015   Iris bomb 07/31/2012   Secondary angle-closure glaucoma 07/31/2012   PCP:  Amy Gaudy, DO Pharmacy:   [facility] [phone] - [place], [place] - [address] [place] [address] Phone: [phone] Fax: [phone]  [facility] [address] [place] [address] Phone: [phone] Fax: [phone]  [facility] [address] [place] [address] Phone: [phone] Fax: [phone]     Social Drivers of  Health (SDOH) Social History: SDOH Screenings   Food Insecurity: No Food Insecurity (04/17/2024)  Housing: Low Risk  (04/17/2024)  Transportation Needs: No Transportation Needs (04/17/2024)  Utilities: Not At Risk (04/17/2024)  Depression (PHQ2-9): Low Risk  (04/06/2022)  Tobacco Use: Low Risk  (04/17/2024)   SDOH Interventions:     Readmission Risk Interventions    04/18/2024   12:53 PM 11/03/2023   11:18 AM 11/02/2023   11:31 AM  Readmission Risk Prevention Plan  Transportation Screening Complete Complete Complete  PCP or Specialist Appt within 5-7 Days  Complete   Home Care Screening  Complete   Medication Review (RN CM)  Complete   HRI or Home Care Consult   Complete  Social Work Consult for Recovery Care Planning/Counseling   Complete  Palliative Care Screening   Not Applicable  Medication Review Ocean[employer]) Complete  Complete  HRI or Home Care Consult Complete    SW Recovery Care/Counseling Consult Complete    Palliative Care Screening Not Applicable    Skilled Nursing Facility Not Applicable

## 2024-04-18 NOTE — Progress Notes (Signed)
 Initial Nutrition Assessment  DOCUMENTATION CODES:   Non-severe (moderate) malnutrition in context of chronic illness  INTERVENTION:   D/C Glucerna, patient does not tolerate. Try Boost Breeze po TID, each supplement provides 250 kcal and 9 grams of protein. Try Prosource Plus 30 ml PO BID, each packet provides 100 kcal and 15 gm protein. Renal MVI daily. Vitamin C 500 mg daily x 14 days to support wound healing. Zinc  sulfate 220 mg daily x 14 days to support wound healing.  NUTRITION DIAGNOSIS:   Moderate Malnutrition related to chronic illness (ESRD on HD) as evidenced by mild muscle depletion, moderate muscle depletion, mild fat depletion, moderate fat depletion.  GOAL:   Patient will meet greater than or equal to 90% of their needs  MONITOR:   PO intake, Supplement acceptance, Skin  REASON FOR ASSESSMENT:   Consult Wound healing  ASSESSMENT:   53 yo female admitted with R sided decubitus ulcer infected wound. PMH includes ESRD on HD, HLD, anemia, calciphylaxis to legs, wounds to R ischium, heel ulcers, DM-2, HTN, CHF, hypothyroidism, R eye blindness.  Patient missed 1-2 HD sessions this past week d/t weakness. HD scheduled for today. She usually receives HD on TTS in [place], [place].   Patient c/o poor appetite since Sunday, but prior to that she was eating well and had a good appetite. She reports losing a lot of weight a month or so ago. She says her dry weight at HD is 96-97 kg. Currently 79.6 kg. She has had the wounds for a while, but the one on her bottom has gotten worse recently. She is lactose intolerant and although Glucerna is lactose free, she prefers not to drink it because it may make her have diarrhea. She agreed to try Boost Breeze, Prosource Plus, and vitamin supplements to help with wound healing.   Labs reviewed. Na 129, corrected calcium  9.8 WNL CBG: 79-118  Medications reviewed and include Auryxia , novolog , IV rocephin , IV flagyl .  Weight history  reviewed.  Current weight: 79.6 kg 3 months ago: 88.1 kg 6 months ago: 93 kg 14% weight loss within 6 months is significant, but could be partially r/t fluids given hx ESRD on HD.   NUTRITION - FOCUSED PHYSICAL EXAM:  Flowsheet Row Most Recent Value  Orbital Region Mild depletion  Upper Arm Region Moderate depletion  Thoracic and Lumbar Region Mild depletion  Buccal Region Mild depletion  Temple Region Mild depletion  Clavicle Bone Region Moderate depletion  Clavicle and Acromion Bone Region Mild depletion  Scapular Bone Region Mild depletion  Dorsal Hand Moderate depletion  Patellar Region Mild depletion  Anterior Thigh Region Mild depletion  Posterior Calf Region Mild depletion  Edema (RD Assessment) Mild  Hair Reviewed  Eyes Reviewed  Mouth Reviewed  Skin Reviewed  Nails Reviewed       Diet Order:   Diet Order             Diet renal with fluid restriction Fluid restriction: 1200 mL Fluid; Room service appropriate? Yes; Fluid consistency: Thin  Diet effective now                   EDUCATION NEEDS:   Education needs have been addressed  Skin:  Skin Assessment: Skin Integrity Issues: Skin Integrity Issues:: Stage III, Unstageable, Other (Comment) Stage III: R buttocks Unstageable: R heel, healing Other: multiple full thickness wounds d/t calciphylaxis to BLE  Last BM:  5/29 type 7  Height:   Ht Readings from Last 1 Encounters:  04/17/24 5\' 6"  (1.676 m)    Weight:   Wt Readings from Last 1 Encounters:  04/18/24 79.6 kg    Ideal Body Weight:  59.1 kg  BMI:  Body mass index is 28.32 kg/m.  Estimated Nutritional Needs:   Kcal:  2000-2250  Protein:  100-120 gm  Fluid:  1 L + UOP   Barnet Boots RD, LDN, CNSC Contact via secure chat. If unavailable, use group chat "RD Inpatient."

## 2024-04-18 NOTE — Consult Note (Addendum)
 WOC Nurse Consult Note: this patient is known to [facility] team from previous admissions; has calciphylaxis to legs, heel ulcers and Stage 3 Pressure Injury R ischium followed by Dr. Julio Ohm, last visit with Dr. Julio Ohm 03/18/2024 at which time he ordered silver  for the R ischium however wound with slough and purulent drainage at this time  Reason for Consult: R buttocks wound  Wound type: Stage 3 Pressure Injury R ischium; R heel healing Unstageable PI, full thickness B legs r/t calciphylaxis  Pressure Injury POA: Yes Measurement: see nursing flowsheet  Wound bed: 50% dark slough 50% red  Drainage (amount, consistency, odor) foul smelling purulent drainage per MD note  Periwound: dark discoloration  Dressing procedure/placement/frequency: Cleanse R ischial wound with Vashe wound cleanser [name] [phone]), do not rinse and allow to air dry.  Apply Medihoney to wound bed daily, cover with dry gauze and silicone foam or ABD pad whichever is preferred.  Cleanse B lower legs with Vashe wound cleanser (do not rinse and allow to air dry). Apply Eucerin to legs 2 times daily. May cover any open wounds with Xeroform gauze ([place] [phone]) if needed.  Cleanse R heel with Vashe, apply Xeroform gauze ([place] [phone]) to wound bed every other day.  Cover with silicone foam heel protector. Place R foot in Prevalon boot [name] [phone]) to offload pressure.      POC discussed with bedside nurse. Appreciate N. Rone, RN assistance with this consult. WOC team will not follow. Per MD note possible consult to surgery for ? Debridement. Any orders placed by surgery supercede orders placed by [facility] RN.    Re-consult if further needs arise. Patient should resume follow-up with Dr. Julio Ohm as outpatient.    Thank you,    Ronni Colace MSN, RN-BC, [employer] [phone]

## 2024-04-18 NOTE — Progress Notes (Addendum)
 PROGRESS NOTE  Amy Moses, is a 53 y.o. female, DOB - [birth year], QMV:[id number]  Admit date - 04/17/2024   Admitting Physician Twilla Galea, DO  Outpatient Primary MD for the patient is Dorrine Gaudy, DO  LOS - 1  Chief Complaint  Patient presents with   Weakness      Brief Narrative:  53 y.o. female with medical history significant of   right lower extremity nonhealing ulcer follows orthopedist Dr. Julio Ohm as an outpatient, peripheral vascular disease, pressure ulcer of the right heel, ESRD on dialysis TTS schedule, chronic hypotension on midodrine , DM type II, hypothyroidism, OSA, thrombocytopenia, chronic diastolic heart failure, and recurrent C. difficile colitis admitted with generalized weakness and ongoing diarrhea, missed hemodialysis on 04/16/2024    -Assessment and Plan: 1)Recurrent C Diff--- C diff +ve on 01/14/24 , C diff Neg on 02/16/24 and Now C diff +ve on 04/18/24-- --discussed with ID physician Dr. Levern Reader, she recommends oral Vanco taper CT abdomen and pelvis without acute findings WBC 20.4 >>19.3  - Anticipate that at discharge patient will need a 12-week oral Vanco taper (with vancomycin  125 mg p.o. 4 times daily for 14 days, then vancomycin  125 mg twice daily for 7 days then vancomycin  125 mg daily for 7 days, then vancomycin  125 mg every other day for 28 days and then vancomycin  every 3 days for 28 days. -- At the completion of oral Vanco taper patient should consider outpatient Vowst (microbiota capsule)   2)ESRD--TTS schedule - HD on 04/16/2024 - Nephrology consult for HD appreciated - Tolerated HD well on 04/18/2024  3)Decubitus Ulcers--- see photos in epic -Given history of recurrent C. difficile be judicious with antibiotics -Treated with Rocephin , Flagyl  -Stop Levaquin - De-escalate antibiotics to Keflex and Flagyl  at dc  4)Anemia of chronic disease This is possibly due to patient's history of ESRD Hemoglobin at 11.2 is within baseline  range.  5)Acquired hypothyroidism Continue Synthroid   6)DM2-A1c 5.6 reflecting excellent diabetic control PTA Use Novolog /Humalog  Sliding scale insulin  with Accu-Cheks/Fingersticks as ordered   7)HD exacerbated hypotension--- continue PTA midodrine    Status is: Inpatient   Disposition: The patient is from: Home              Anticipated d/c is to: Home              Anticipated d/c date is: 1 day              Patient currently is not medically stable to d/c. Barriers: Not Clinically Stable-   Code Status : -  Code Status: Full Code   Family Communication:   NA (patient is alert, awake and coherent)   DVT Prophylaxis  :   - SCDs  heparin  injection 5,000 Units Start: 04/17/24 2215 SCDs Start: 04/17/24 2122   Lab Results  Component Value Date   PLT 128 (L) 04/18/2024    Inpatient Medications  Scheduled Meds:  (feeding supplement) PROSource Plus  30 mL Oral BID   ascorbic acid   500 mg Oral Daily   atorvastatin   10 mg Oral QPM   Chlorhexidine  Gluconate Cloth  6 each Topical Q0600   Chlorhexidine  Gluconate Cloth  6 each Topical Q0600   feeding supplement  1 Container Oral TID BM   ferric citrate   630 mg Oral TID WC   heparin   5,000 Units Subcutaneous Q8H   hydrocerin   Topical BID   insulin  aspart  0-6 Units Subcutaneous TID WC   leptospermum manuka honey  1 Application Topical Daily  levothyroxine   75 mcg Oral Q0600   midodrine   10 mg Oral TID with meals   multivitamin  1 tablet Oral QHS   vancomycin   125 mg Oral QID   zinc  sulfate (50mg  elemental zinc )  220 mg Oral Daily   Continuous Infusions:  cefTRIAXone  (ROCEPHIN )  IV     metronidazole  Stopped (04/18/24 0940)   sodium chloride      PRN Meds:.acetaminophen  **OR** acetaminophen , melatonin, ondansetron  **OR** ondansetron  (ZOFRAN ) IV, mouth rinse, pentafluoroprop-tetrafluoroeth   Anti-infectives (From admission, onward)    Start     Dose/Rate Route Frequency Ordered Stop   04/19/24 2200  levofloxacin  (LEVAQUIN) IVPB 500 mg  Status:  Discontinued        500 mg 100 mL/hr over 60 Minutes Intravenous Every 48 hours 04/17/24 2149 04/18/24 1044   04/18/24 2200  cefTRIAXone  (ROCEPHIN ) 2 g in sodium chloride  0.9 % 100 mL IVPB        2 g 200 mL/hr over 30 Minutes Intravenous Every 24 hours 04/18/24 1044     04/18/24 1215  vancomycin  (VANCOCIN ) capsule 125 mg        125 mg Oral 4 times daily 04/18/24 1115     04/18/24 0800  metroNIDAZOLE  (FLAGYL ) IVPB 500 mg        500 mg 100 mL/hr over 60 Minutes Intravenous Every 12 hours 04/17/24 2136     04/17/24 2245  levofloxacin (LEVAQUIN) IVPB 750 mg        750 mg 100 mL/hr over 90 Minutes Intravenous  Once 04/17/24 2149 04/18/24 0732   04/17/24 1930  metroNIDAZOLE  (FLAGYL ) IVPB 500 mg        500 mg 100 mL/hr over 60 Minutes Intravenous  Once 04/17/24 1925 04/18/24 0805   04/17/24 1715  cefTRIAXone  (ROCEPHIN ) 2 g in sodium chloride  0.9 % 100 mL IVPB        2 g 200 mL/hr over 30 Minutes Intravenous Once 04/17/24 1703 04/17/24 1856       Subjective: Demesha Virts today has no fevers, no emesis,  No chest pain,    Diarrhea persist---possible discharge home on 04/19/2024 if frequency of volume or stools improved with oral Vanco  - Appetite is poor  Objective: Vitals:   04/18/24 1500 04/18/24 1600 04/18/24 1700 04/18/24 1800  BP: (!) 106/46 (!) 120/52 (!) 111/53 (!) 99/51  Pulse: 74 73 77 70  Resp: (!) 21 20 (!) 22 (!) 27  Temp: 98.3 F (36.8 C)  99.2 F (37.3 C)   TempSrc: Oral  Axillary   SpO2: 97% 99% 96% 96%  Weight: 78.7 kg     Height:        Intake/Output Summary (Last 24 hours) at 04/18/2024 1835 Last data filed at 04/18/2024 1500 Gross per 24 hour  Intake 640.91 ml  Output 0 ml  Net 640.91 ml   Filed Weights   04/18/24 0400 04/18/24 1115 04/18/24 1500  Weight: 79.6 kg 79.8 kg 78.7 kg    Physical Exam Gen:- Awake Alert,  in no apparent distress  HEENT:- [place].AT, No sclera icterus Neck-Supple Neck,No JVD,.  Lungs- CTAB ,  fair symmetrical air movement CV- S1, S2 normal, regular  Abd-  +ve B.Sounds, Abd Soft, No tenderness,    Psych-affect is appropriate, oriented x3 Neuro-no new focal deficits, no tremors MSK--Lt UE AVF with thrill and  bruit Skin--Decubitus ulcers--- see photos in epic  Data Reviewed: I have personally reviewed following labs and imaging studies  CBC: Recent Labs  Lab 04/17/24 1635 04/17/24  1754 04/18/24 0425  WBC 20.4*  --  19.3*  NEUTROABS 17.3*  --   --   HGB 11.2* 12.2 9.5*  HCT 35.2* 36.0 29.7*  MCV 89.1  --  87.9  PLT 143*  --  128*   Basic Metabolic Panel: Recent Labs  Lab 04/17/24 1635 04/17/24 1754 04/18/24 0425  NA 128* 128* 129*  K 4.8 4.6 5.0  CL 89* 95* 93*  CO2 20*  --  21*  GLUCOSE 89 94 124*  BUN 54* 47* 60*  CREATININE 9.03* 9.40* 8.84*  CALCIUM  9.1  --  8.6*  MG  --   --  1.9  PHOS  --   --  7.7*   GFR: Estimated Creatinine Clearance: 8.1 mL/min (A) (by C-G formula based on SCr of 8.84 mg/dL (H)). Liver Function Tests: Recent Labs  Lab 04/17/24 1635 04/18/24 0425  AST 10* 10*  ALT 8 8  ALKPHOS 98 95  BILITOT 1.5* 0.9  PROT 6.7 5.6*  ALBUMIN  3.1* 2.5*   Recent Results (from the past 240 hours)  Resp panel by RT-PCR (RSV, Flu A&B, Covid) Anterior Nasal Swab     Status: None   Collection Time: 04/17/24  5:34 PM   Specimen: Anterior Nasal Swab  Result Value Ref Range Status   SARS Coronavirus 2 by RT PCR NEGATIVE NEGATIVE Final    Comment: (NOTE) SARS-CoV-2 target nucleic acids are NOT DETECTED.  The SARS-CoV-2 RNA is generally detectable in upper respiratory specimens during the acute phase of infection. The lowest concentration of SARS-CoV-2 viral copies this assay can detect is 138 copies/mL. A negative result does not preclude SARS-Cov-2 infection and should not be used as the sole basis for treatment or other patient management decisions. A negative result may occur with  improper specimen collection/handling, submission of  specimen other than nasopharyngeal swab, presence of viral mutation(s) within the areas targeted by this assay, and inadequate number of viral copies(<138 copies/mL). A negative result must be combined with clinical observations, patient history, and epidemiological information. The expected result is Negative.  Fact Sheet for Patients:  BloggerCourse.com  Fact Sheet for Healthcare Providers:  SeriousBroker.it  This test is no t yet approved or cleared by the [place]  FDA and  has been authorized for detection and/or diagnosis of SARS-CoV-2 by FDA under an Emergency Use Authorization (EUA). This EUA will remain  in effect (meaning this test can be used) for the duration of the COVID-19 declaration under Section 564(b)(1) of the Act, 21 U.S.C.section 360bbb-3(b)(1), unless the authorization is terminated  or revoked sooner.       Influenza A by PCR NEGATIVE NEGATIVE Final   Influenza B by PCR NEGATIVE NEGATIVE Final    Comment: (NOTE) The Xpert Xpress SARS-CoV-2/FLU/RSV plus assay is intended as an aid in the diagnosis of influenza from Nasopharyngeal swab specimens and should not be used as a sole basis for treatment. Nasal washings and aspirates are unacceptable for Xpert Xpress SARS-CoV-2/FLU/RSV testing.  Fact Sheet for Patients: BloggerCourse.com  Fact Sheet for Healthcare Providers: SeriousBroker.it  This test is not yet approved or cleared by the [place]  FDA and has been authorized for detection and/or diagnosis of SARS-CoV-2 by FDA under an Emergency Use Authorization (EUA). This EUA will remain in effect (meaning this test can be used) for the duration of the COVID-19 declaration under Section 564(b)(1) of the Act, 21 U.S.C. section 360bbb-3(b)(1), unless the authorization is terminated or revoked.     Resp Syncytial Virus by PCR  NEGATIVE NEGATIVE Final     Comment: (NOTE) Fact Sheet for Patients: BloggerCourse.com  Fact Sheet for Healthcare Providers: SeriousBroker.it  This test is not yet approved or cleared by the [place]  FDA and has been authorized for detection and/or diagnosis of SARS-CoV-2 by FDA under an Emergency Use Authorization (EUA). This EUA will remain in effect (meaning this test can be used) for the duration of the COVID-19 declaration under Section 564(b)(1) of the Act, 21 U.S.C. section 360bbb-3(b)(1), unless the authorization is terminated or revoked.  Performed at [facility], [address], [place], [place] [address]   Blood Culture (routine x 2)     Status: None (Preliminary result)   Collection Time: 04/17/24  5:34 PM   Specimen: BLOOD  Result Value Ref Range Status   Specimen Description BLOOD BLOOD LEFT FOREARM  Final   Special Requests   Final    BOTTLES DRAWN AEROBIC AND ANAEROBIC Blood Culture adequate volume   Culture   Final    NO GROWTH < 24 HOURS Performed at [facility], [address], [place], [place] [address]    Report Status PENDING  Incomplete  Blood Culture (routine x 2)     Status: None (Preliminary result)   Collection Time: 04/17/24  6:04 PM   Specimen: BLOOD  Result Value Ref Range Status   Specimen Description BLOOD RIGHT ANTECUBITAL  Final   Special Requests   Final    BOTTLES DRAWN AEROBIC AND ANAEROBIC Blood Culture results may not be optimal due to an inadequate volume of blood received in culture bottles   Culture   Final    NO GROWTH < 12 HOURS Performed at [name], [address], [place], [place] [address]    Report Status PENDING  Incomplete  MRSA Next Gen by PCR, Nasal     Status: None   Collection Time: 04/17/24  8:55 PM   Specimen: Nasal Mucosa; Nasal Swab  Result Value Ref Range Status   MRSA by PCR Next Gen NOT DETECTED NOT DETECTED Final    Comment: (NOTE) The GeneXpert MRSA Assay (FDA approved for  NASAL specimens only), is one component of a comprehensive MRSA colonization surveillance program. It is not intended to diagnose MRSA infection nor to guide or monitor treatment for MRSA infections. Test performance is not FDA approved in patients less than 28 years old. Performed at [facility], [address] [place], [place] [address]   C Difficile Quick Screen w PCR reflex     Status: Abnormal   Collection Time: 04/18/24  9:47 AM   Specimen: STOOL  Result Value Ref Range Status   C Diff antigen POSITIVE (A) NEGATIVE Final   C Diff toxin POSITIVE (A) NEGATIVE Final   C Diff interpretation Toxin producing C. difficile detected.  Final    Comment: CRITICAL RESULT CALLED TO, READ BACK BY AND VERIFIED WITH: C ROGERS AT 1110 04/18/24 BY A WILSON Performed at [facility], [address], [place], [place] [address]     Radiology Studies: CT ABDOMEN PELVIS WO CONTRAST Result Date: 04/17/2024 CLINICAL DATA:  Abdominal pain, acute, nonlocalized 87F when her home health nurse checked on her today, and Pt reports increasing weakness since Monday. EXAM: CT ABDOMEN AND PELVIS WITHOUT CONTRAST TECHNIQUE: Multidetector CT imaging of the abdomen and pelvis was performed following the standard protocol without IV contrast. RADIATION DOSE REDUCTION: This exam was performed according to the departmental dose-optimization program which includes automated exposure control, adjustment of the mA and/or kV according  to patient size and/or use of iterative reconstruction technique. COMPARISON:  CT abdomen pelvis 02/16/2024. FINDINGS: Lower chest: At least small volume pericardial effusion. Cardiac changes suggestive of anemia. Coronary artery calcification. Hepatobiliary: No focal liver abnormality. Status post cholecystectomy. No biliary dilatation. Pancreas: No focal lesion. Normal pancreatic contour. No surrounding inflammatory changes. No main pancreatic ductal dilatation. Spleen: Normal in size without focal  abnormality. Adrenals/Urinary Tract: No adrenal nodule bilaterally. Bilateral renal scarring. No nephrolithiasis and no hydronephrosis. No definite contour-deforming renal mass. No ureterolithiasis or hydroureter. The urinary bladder is decompressed. Stomach/Bowel: Stomach is within normal limits. No evidence of bowel wall thickening or dilatation. Appendix appears normal. Vascular/Lymphatic: No abdominal aorta or iliac aneurysm. Extensive atherosclerotic plaque of the aorta and its branches. No abdominal, pelvic, or inguinal lymphadenopathy. Reproductive: Lobulated uterine contour with calcified and enhancing fundal mass suggestive of a degenerative uterine fibroid. Otherwise uterus and bilateral adnexa are unremarkable. Other: No intraperitoneal free fluid. No intraperitoneal free gas. No organized fluid collection. Musculoskeletal: No abdominal wall hernia or abnormality. Calcified granulomas of the gluteal soft tissues. No suspicious lytic or blastic osseous lesions. No acute displaced fracture. Multilevel degenerative changes of the spine. IMPRESSION: 1. No acute intra-abdominal or intrapelvic abnormality with limited evaluation on this noncontrast study. 2. Uterine fibroids. 3.  Aortic Atherosclerosis (ICD10-I70.0)-extensive. Electronically Signed   By: Morgane  Naveau M.D.   On: 04/17/2024 18:50   DG Chest Port 1 View Result Date: 04/17/2024 CLINICAL DATA:  Questionable sepsis - evaluate for abnormality EXAM: PORTABLE CHEST - 1 VIEW COMPARISON:  June 24, 2023 FINDINGS: No focal airspace consolidation, pleural effusion, or pneumothorax. Unchanged moderate cardiomegaly. Aortic atherosclerosis. No acute fracture or destructive lesion. IMPRESSION: Unchanged moderate cardiomegaly. Otherwise, no pneumonia or pulmonary edema. Electronically Signed   By: Rance Burrows M.D.   On: 04/17/2024 18:21   Scheduled Meds:  (feeding supplement) PROSource Plus  30 mL Oral BID   ascorbic acid   500 mg Oral Daily    atorvastatin   10 mg Oral QPM   Chlorhexidine  Gluconate Cloth  6 each Topical Q0600   Chlorhexidine  Gluconate Cloth  6 each Topical Q0600   feeding supplement  1 Container Oral TID BM   ferric citrate   630 mg Oral TID WC   heparin   5,000 Units Subcutaneous Q8H   hydrocerin   Topical BID   insulin  aspart  0-6 Units Subcutaneous TID WC   leptospermum manuka honey  1 Application Topical Daily   levothyroxine   75 mcg Oral Q0600   midodrine   10 mg Oral TID with meals   multivitamin  1 tablet Oral QHS   vancomycin   125 mg Oral QID   zinc  sulfate (50mg  elemental zinc )  220 mg Oral Daily   Continuous Infusions:  cefTRIAXone  (ROCEPHIN )  IV     metronidazole  Stopped (04/18/24 0940)   sodium chloride       LOS: 1 day   Colin Dawley M.D on 04/18/2024 at 6:35 PM  Go to [email or web address] - for contact info  [facility] - Office  [phone]  If 7PM-7AM, please contact night-coverage [email or web address] 04/18/2024, 6:35 PM

## 2024-04-19 DIAGNOSIS — A0472 Enterocolitis due to Clostridium difficile, not specified as recurrent: Secondary | ICD-10-CM | POA: Diagnosis not present

## 2024-04-19 LAB — GASTROINTESTINAL PANEL BY PCR, STOOL (REPLACES STOOL CULTURE)

## 2024-04-19 LAB — CBC
HCT: 29 % — ABNORMAL LOW (ref 36.0–46.0)
Hemoglobin: 9 g/dL — ABNORMAL LOW (ref 12.0–15.0)
MCH: 27.5 pg (ref 26.0–34.0)
MCHC: 31 g/dL (ref 30.0–36.0)
MCV: 88.7 fL (ref 80.0–100.0)
Platelets: 114 10*3/uL — ABNORMAL LOW (ref 150–400)
RBC: 3.27 MIL/uL — ABNORMAL LOW (ref 3.87–5.11)
RDW: 14 % (ref 11.5–15.5)
WBC: 16.2 10*3/uL — ABNORMAL HIGH (ref 4.0–10.5)
nRBC: 0 % (ref 0.0–0.2)

## 2024-04-19 LAB — HEPATITIS B SURFACE ANTIBODY, QUANTITATIVE: Hep B S AB Quant (Post): <3.5 m[IU]/mL — ABNORMAL LOW

## 2024-04-19 LAB — GLUCOSE, CAPILLARY
Glucose-Capillary: 198 mg/dL — ABNORMAL HIGH (ref 70–99)
Glucose-Capillary: 265 mg/dL — ABNORMAL HIGH (ref 70–99)

## 2024-04-19 MED ORDER — RISAQUAD PO CAPS
2.0000 | ORAL_CAPSULE | Freq: Two times a day (BID) | ORAL | 3 refills | Status: DC
Start: 2024-04-19 — End: 2024-04-21

## 2024-04-19 MED ORDER — ZINC SULFATE 220 (50 ZN) MG PO CAPS: 220.0000 mg | ORAL_CAPSULE | Freq: Every day | ORAL | 3 refills | Status: DC

## 2024-04-19 MED ORDER — VANCOMYCIN HCL 125 MG PO CAPS: 125.0000 mg | ORAL_CAPSULE | ORAL | 1 refills | Status: DC

## 2024-04-19 MED ORDER — MEDIHONEY WOUND/BURN DRESSING EX PSTE: 1.0000 | PASTE | Freq: Every day | CUTANEOUS | 5 refills | Status: DC

## 2024-04-19 MED ORDER — ASCORBIC ACID 500 MG PO TABS: 500.0000 mg | ORAL_TABLET | Freq: Every day | ORAL | 3 refills | Status: DC

## 2024-04-19 MED ORDER — MIDODRINE HCL 5 MG PO TABS: 15.0000 mg | ORAL_TABLET | Freq: Three times a day (TID) | ORAL | 5 refills | Status: DC

## 2024-04-19 NOTE — TOC Transition Note (Signed)
 Transition of Care [facility]) - Discharge Note   Patient Details  Name: Amy Moses MRN: [id number] Date of Birth: November 14, 1971  [facility] CM/SW Contact:  Orelia Binet, RN Phone Number: 04/19/2024, 10:52 AM   Clinical Narrative:   Patient is discharging home with [facility]. Mariah Shines updated. CM at the bedside to discuss her air mattress. She had one and states that it was picked up after her wounds healed. She returns to the hospital with wounds. CM called [employer]. She does have some equipment we ordered through [employer] but not her hospital bed. [employer] will not accept an order for an air mattress since she does not have one of their beds. CM updated the patient she will follow up with her PCP and [facility]. They have provided some of her equipment. Mariah Shines also update, for RN to assist with getting PCP to order air mattress.    Final next level of care: Home/Self Care Barriers to Discharge: Barriers Resolved   Patient Goals and CMS Choice Patient states their goals for this hospitalization and ongoing recovery are:: return home CMS Medicare.gov Compare Post Acute Care list provided to:: Patient Choice offered to / list presented to : Patient     Discharge Placement      Patient and family notified of of transfer: 04/19/24  Discharge Plan and Services Additional resources added to the After Visit Summary for   In-house Referral: Clinical Social Work Discharge Planning Services: CM Consult Post Acute Care Choice: Home Health                 Social Drivers of Health (SDOH) Interventions SDOH Screenings   Food Insecurity: No Food Insecurity (04/17/2024)  Housing: Low Risk  (04/17/2024)  Transportation Needs: No Transportation Needs (04/17/2024)  Utilities: Not At Risk (04/17/2024)  Depression (PHQ2-9): Low Risk  (04/06/2022)  Tobacco Use: Low Risk  (04/17/2024)     Readmission Risk Interventions    04/18/2024   12:53 PM 11/03/2023   11:18 AM 11/02/2023    11:31 AM  Readmission Risk Prevention Plan  Transportation Screening Complete Complete Complete  PCP or Specialist Appt within 5-7 Days  Complete   Home Care Screening  Complete   Medication Review (RN CM)  Complete   HRI or Home Care Consult   Complete  Social Work Consult for Recovery Care Planning/Counseling   Complete  Palliative Care Screening   Not Applicable  Medication Review Oceanographer) Complete  Complete  HRI or Home Care Consult Complete    SW Recovery Care/Counseling Consult Complete    Palliative Care Screening Not Applicable    Skilled Nursing Facility Not Applicable

## 2024-04-19 NOTE — Progress Notes (Signed)
 Nephrology Follow-Up Consult note   Outpatient dialysis unit: [name] dialysis prescription: TTS [facility] [place], [place] 3h   2K bath  B400   LUA AVF Heparin  none   Assessment/Recommendations: Amy Moses is a/an 53 y.o. female with a past medical history notable for ESRD on HD admitted with sacral wound infection.    # ESRD: Continue dialysis today per TTS schedule   # Volume/ hypotension: Says she has been losing weight steadily.  Continue home midodrine .  Attempt ultrafiltration on dialysis tomorrow.   # Anemia of Chronic Kidney Disease: Hemoglobin 9. possibly some dilutional component.  Holding iron  because of some infection concern.  Consider ESA next week.   # Secondary Hyperparathyroidism/Hyperphosphatemia: Calcium  and phosphorus acceptable.  Continue home binders   # Vascular access: AVF with no issues.   # Sacral wound: Wound care and antibiotics per primary team   # Additional recommendations: - Dose all meds for creatinine clearance < 10 ml/min  - Unless absolutely necessary, no MRIs with gadolinium.  - Implement save arm precautions.  Prefer needle sticks in the dorsum of the hands or wrists.  No blood pressure measurements in arm. - If blood transfusion is requested during hemodialysis sessions, please alert us  prior to the session.  - Use synthetic opioids (Fentanyl /Dilaudid ) if needed   Recommendations were discussed with the primary team.   Recommendations conveyed to primary service.    Levorn Reason [facility] 04/19/2024 11:03 AM  ___________________________________________________________  CC: Weakness  Interval History/Subjective: Patient continues to feel weak but otherwise no complaints.  Tolerated HD yesterday with no ultrafiltration.   Medications:  Current Facility-Administered Medications  Medication Dose Route Frequency Provider Last Rate Last Admin   (feeding supplement) PROSource Plus  liquid 30 mL  30 mL Oral BID Emokpae, Courage, MD   30 mL at 04/18/24 1714   acetaminophen  (TYLENOL ) tablet 650 mg  650 mg Oral Q6H PRN Adefeso, Oladapo, DO       Or   acetaminophen  (TYLENOL ) suppository 650 mg  650 mg Rectal Q6H PRN Adefeso, Oladapo, DO       ascorbic acid  (VITAMIN C) tablet 500 mg  500 mg Oral Daily Emokpae, Courage, MD   500 mg at 04/19/24 0865   atorvastatin  (LIPITOR) tablet 10 mg  10 mg Oral QPM Adefeso, Oladapo, DO   10 mg at 04/18/24 1714   Chlorhexidine  Gluconate Cloth 2 % PADS 6 each  6 each Topical Q0600 Adefeso, Oladapo, DO   6 each at 04/17/24 2051   Chlorhexidine  Gluconate Cloth 2 % PADS 6 each  6 each Topical Q0600 Levorn Reason, MD   6 each at 04/19/24 0539   feeding supplement (BOOST / RESOURCE BREEZE) liquid 1 Container  1 Container Oral TID BM Emokpae, Courage, MD   1 Container at 04/19/24 0817   ferric citrate  (AURYXIA ) tablet 630 mg  630 mg Oral TID WC Rache Klimaszewski J, MD   630 mg at 04/19/24 0811   heparin  injection 5,000 Units  5,000 Units Subcutaneous Q8H Adefeso, Oladapo, DO   5,000 Units at 04/19/24 7846   hydrocerin (EUCERIN) cream   Topical BID Colin Dawley, MD   Given at 04/19/24 0817   insulin  aspart (novoLOG ) injection 0-6 Units  0-6 Units Subcutaneous TID WC Adefeso, Oladapo, DO   1 Units at 04/19/24 0809   leptospermum manuka honey (MEDIHONEY) paste 1 Application  1 Application Topical Daily Adefeso, Oladapo, DO   1 Application at 04/18/24 0943   levothyroxine  (  SYNTHROID ) tablet 75 mcg  75 mcg Oral Q0600 Adefeso, Oladapo, DO   75 mcg at 04/19/24 0540   melatonin tablet 6 mg  6 mg Oral QHS PRN Adefeso, Oladapo, DO   6 mg at 04/18/24 2147   metroNIDAZOLE  (FLAGYL ) IVPB 500 mg  500 mg Intravenous Q12H Adefeso, Oladapo, DO 100 mL/hr at 04/19/24 0816 500 mg at 04/19/24 1610   midodrine  (PROAMATINE ) tablet 10 mg  10 mg Oral TID with meals Adefeso, Oladapo, DO   10 mg at 04/19/24 9604   multivitamin (RENA-VIT) tablet 1 tablet  1 tablet Oral QHS  Colin Dawley, MD   1 tablet at 04/18/24 2148   ondansetron  (ZOFRAN ) tablet 4 mg  4 mg Oral Q6H PRN Adefeso, Oladapo, DO       Or   ondansetron  (ZOFRAN ) injection 4 mg  4 mg Intravenous Q6H PRN Adefeso, Oladapo, DO   4 mg at 04/18/24 5409   Oral care mouth rinse  15 mL Mouth Rinse PRN Adefeso, Oladapo, DO       pentafluoroprop-tetrafluoroeth (GEBAUERS) aerosol 1 Application  1 Application Topical PRN Levorn Reason, MD       sodium chloride  0.9 % bolus 500 mL  500 mL Intravenous Once Adefeso, Oladapo, DO       vancomycin  (VANCOCIN ) capsule 125 mg  125 mg Oral QID Quintella Buck, Courage, MD   125 mg at 04/19/24 8119   zinc  sulfate (50mg  elemental zinc ) capsule 220 mg  220 mg Oral Daily Emokpae, Courage, MD   220 mg at 04/19/24 1478      Review of Systems: 10 systems reviewed and negative except per interval history/subjective  Physical Exam: Vitals:   04/19/24 0811 04/19/24 0900  BP:  (!) 106/56  Pulse:  68  Resp:  15  Temp: 98.1 F (36.7 C)   SpO2:  100%   Total I/O In: 240 [P.O.:240] Out: -   Intake/Output Summary (Last 24 hours) at 04/19/2024 1103 Last data filed at 04/19/2024 2956 Gross per 24 hour  Intake 477 ml  Output 0 ml  Net 477 ml   Constitutional: Chronically ill-appearing, no apparent distress ENMT: ears and nose without scars or lesions, MMM CV: normal rate, no edema Respiratory: Bilateral rise, normal work of breathing Gastrointestinal: soft, non-tender, no palpable masses or hernias Skin: no visible lesions or rashes Psych: alert, judgement/insight appropriate, appropriate mood and affect   Test Results I personally reviewed new and old clinical labs and radiology tests Lab Results  Component Value Date   NA 129 (L) 04/18/2024   K 5.0 04/18/2024   CL 93 (L) 04/18/2024   CO2 21 (L) 04/18/2024   BUN 60 (H) 04/18/2024   CREATININE 8.84 (H) 04/18/2024   GLU 420 12/30/2021   CALCIUM  8.6 (L) 04/18/2024   ALBUMIN  2.5 (L) 04/18/2024   PHOS 7.7 (H)  04/18/2024    CBC Recent Labs  Lab 04/17/24 1635 04/17/24 1754 04/18/24 0425 04/19/24 0833  WBC 20.4*  --  19.3* 16.2*  NEUTROABS 17.3*  --   --   --   HGB 11.2* 12.2 9.5* 9.0*  HCT 35.2* 36.0 29.7* 29.0*  MCV 89.1  --  87.9 88.7  PLT 143*  --  128* 114*

## 2024-04-19 NOTE — Discharge Summary (Signed)
 Amy Moses, is a 53 y.o. female  DOB [birth year]  MRN [id number].  Admission date:  04/17/2024  Admitting Physician  Twilla Galea, DO  Discharge Date:  04/19/2024   Primary MD  Dorrine Gaudy, DO  Recommendations for primary care physician for things to follow:  1)Wound Care---Cleanse R ischial wound with Vashe wound cleanser, do not rinse and allow to air dry.  Apply Medihoney to wound bed daily, cover with dry gauze and silicone foam or ABD pad whichever is preferred Interchangeable with TheraHoney Apply thin layer (3 mm) to wound. -- See above A) Cleanse R ischial wound with Vashe wound cleanser [name]), do not rinse and allow to air dry.  Apply Medihoney to wound bed daily, cover with dry gauze and silicone foam or ABD pad whichever is preferred  B)Cleanse Both lower legs with Vashe wound cleanser (do not rinse and allow to air dry). Apply Eucerin to legs 2 times daily. May cover any open wounds with Xeroform gauze [name]) and silicone foam if needed.  C)Cleanse R heel with Vashe, apply Xeroform gauze ([name]) to wound bed every other day.  Cover with silicone foam heel protector. Place R foot in Prevalon boot [name]) to offload pressure.  2)Recommend 12-week oral Vancomycin  taper (with oral vancomycin  125 mg  4 times daily for 14 days, then vancomycin  125 mg twice daily for 7 days, then vancomycin  125 mg daily for 7 days, then vancomycin  125 mg every other day for 28 days and then vancomycin  every 3 days for 28 days. -- At the completion of oral Vancomycin  taper patient should consider outpatient follow-up with GI provider for outpatient Vowst (microbiota capsule)  3)Very Low-salt diet advised---Less than 2 gm of Sodium per day advised----ok to use Mrs DASH salt substitute instead of Salt 4)Weigh yourself daily, call if you gain more than 3 pounds in 1 day or more than 5 pounds  in 1 week as your hemodialysis schedule may need to be adjusted 5)Limit your Fluid  intake to no more than 60 ounces (1.8 Liters) per day 6)-- At the completion of oral Vancomycin  taper patient should consider outpatient follow-up with GI provider for outpatient Vowst (microbiota capsule) 7)In the Future whenever you have to take systemic antibiotics for infection you should also be considered for oral vancomycin  use for C. Diff Prophylaxis as follows--Consider taking Oral Vancomycin  125 mg orally once daily during systemic antibiotic exposure and for about 7 days after completion of systemic antibiotic  Admission Diagnosis  Wound infection [T14.8XXA, L08.9] Acute sepsis (HCC) [A41.9]   Discharge Diagnosis  Wound infection [T14.8XXA, L08.9] Acute sepsis (HCC) [A41.9]    Principal Problem:   C. difficile colitis--Recurrent Active Problems:   Wound infection   ESRD on hemodialysis (HCC)   Mixed hyperlipidemia   Acquired hypothyroidism   Anemia of chronic disease   Metabolic acidosis, increased anion gap   Chronic hypotension   Leukocytosis   Hyponatremia   Hypoalbuminemia due to protein-calorie malnutrition (HCC)   Insomnia   Malnutrition of moderate  degree      Past Medical History:  Diagnosis Date   Anemia    Blindness of right eye with low vision in contralateral eye    s/p victrectomy   Chronic diastolic heart failure (HCC) 03/30/2021   Diabetes mellitus, type II (HCC)    Dyslipidemia    ESRD on hemodialysis (HCC)    TTS in [place] , [place]   Glaucoma    History of blood transfusion    Hypertension    Hypothyroidism (acquired)    Mitral regurgitation 11/16/2022   Pneumonia    Pulmonary hypertension, unspecified (HCC) 09/12/2022    Past Surgical History:  Procedure Laterality Date   ABDOMINAL AORTOGRAM W/LOWER EXTREMITY Bilateral 12/18/2020   Procedure: ABDOMINAL AORTOGRAM W/LOWER EXTREMITY;  Surgeon: Richrd Char, MD;  [facility]: [facility];  Service:  Cardiovascular;  Laterality: Bilateral;   ABDOMINAL AORTOGRAM W/LOWER EXTREMITY Bilateral 01/25/2022   Procedure: ABDOMINAL AORTOGRAM W/LOWER EXTREMITY;  Surgeon: Margherita Shell, MD;  Location: [facility];  Service: Cardiovascular;  Laterality: Bilateral;   ABDOMINAL AORTOGRAM W/LOWER EXTREMITY Right 08/04/2023   Procedure: ABDOMINAL AORTOGRAM W/LOWER EXTREMITY;  Surgeon: Carlene Che, MD;  Location: [facility];  Service: Cardiovascular;  Laterality: Right;   AMPUTATION Right 02/16/2022   Procedure: RIGHT FOOT 5TH RAY AMPUTATION;  Surgeon: Timothy Ford, MD;  Location: [facility] OR;  Service: Orthopedics;  Laterality: Right;   ANKLE FRACTURE SURGERY Right    AV FISTULA PLACEMENT Left 08/18/2020   Procedure: LEFT ARM BRACHIOCEPHALIC ARTERIOVENOUS (AV) FISTULA CREATION;  Surgeon: Richrd Char, MD;  Location: [facility] OR;  Service: Vascular;  Laterality: Left;   BIOPSY  04/24/2021   Procedure: BIOPSY;  Surgeon: Vinetta Greening, DO;  [facility]: [facility];  Service: Endoscopy;;   CESAREAN SECTION     CHOLECYSTECTOMY     COLONOSCOPY  04/24/2021   Surgeon: Vinetta Greening, DO;  nonbleeding internal hemorrhoids, 1 large (25 mm) pedunculated transverse colon polyp (prolapse type polyp) with adherent clot and stigmata of recent bleed.   COLONOSCOPY WITH PROPOFOL  N/A 05/14/2021   Procedure: COLONOSCOPY WITH PROPOFOL ;  Surgeon: Suzette Espy, MD;  [facility];  Service: Endoscopy;  Laterality: N/A;   COLONOSCOPY WITH PROPOFOL  N/A 03/17/2023   Procedure: COLONOSCOPY WITH PROPOFOL ;  Surgeon: Suzette Espy, MD;  [facility];  Service: Endoscopy;  Laterality: N/A;   ESOPHAGOGASTRODUODENOSCOPY (EGD) WITH PROPOFOL  N/A 04/24/2021   Surgeon: Vinetta Greening, DO;  duodenal erosions and gastritis biopsied (pathology with peptic duodenitis, reactive gastropathy with erosions/chronic inflammation, negative for H. pylori)   EYE SURGERY     Vatrectomy   HEMOSTASIS CLIP  PLACEMENT  05/14/2021   Procedure: HEMOSTASIS CLIP PLACEMENT;  Surgeon: Suzette Espy, MD;  [facility];  Service: Endoscopy;;   IR PERC TUN PERIT CATH WO PORT S&I /IMAG  09/15/2020   IR REMOVAL TUN CV CATH W/O FL  02/19/2021   IR US  GUIDE VASC ACCESS RIGHT  09/15/2020   POLYPECTOMY  04/24/2021   Procedure: POLYPECTOMY;  Surgeon: Vinetta Greening, DO;  [facility];  Service: Endoscopy;;   POLYPECTOMY  05/14/2021   Procedure: POLYPECTOMY;  Surgeon: Suzette Espy, MD;  [facility];  Service: Endoscopy;;   POLYPECTOMY  03/17/2023   Procedure: POLYPECTOMY;  Surgeon: Suzette Espy, MD;  [facility];  Service: Endoscopy;;   SKIN SPLIT GRAFT Bilateral 09/03/2021   Procedure: SKIN GRAFT BILATERAL LEGS;  Surgeon: Timothy Ford, MD;  Location: [facility] OR;  Service: Orthopedics;  Laterality: Bilateral;   SKIN SPLIT GRAFT Left 12/10/2021   Procedure: IRRIGATION AND DEBRIDEMENT LEFT CALF, APPLICATION SPLIT THICKNESS SKIN GRAFT;  Surgeon: Timothy Ford, MD;  Location: [facility] OR;  Service: Orthopedics;  Laterality: Left;   TOE SURGERY       HPI  from the history and physical done on the day of admission:   HPI: Amy Moses is a 53 y.o. female with medical history significant of   right lower extremity nonhealing ulcer follows orthopedist Dr. Julio Ohm as an outpatient, peripheral vascular disease, pressure ulcer of the right heel, ESRD on dialysis TTS schedule, chronic hypotension on midodrine , DM type II, hypothyroidism, OSA, thrombocytopenia, chronic diastolic heart failure, and recurrent C. difficile colitis who presents emergency department due to 2-day onset of generalized fatigue, weakness and lack of appetite.  She endorsed 2 episodes of watery diarrhea on Saturday (5/24) and it self resolved.  She missed dialysis yesterday due to the weakness and fatigue.  She denies nausea, vomiting, fever, chest pain or shortness of breath.   ED Course:  In the emergency  department, BP was soft at 91/62, other vital signs were within normal range.  Workup in the ED showed leukocytosis with WBC of 20.4, normocytic anemia, thrombocytopenia.  BMP showed sodium 128, potassium 4.8, chloride 89, bicarb 20, blood glucose 89, BUN 54, creatinine 9.03, albumin  3.1.  Lactic acid was normal at 1.3.  Influenza A, B, SARS coronavirus 2, RSV was normal, blood culture was pending. CT abdomen and pelvis showed no acute intra-abdominal or intrapelvic abnormality.  Uterine fibroids Chest x-ray showed unchanged moderate cardiomegaly. She was treated with IV ceftriaxone  and metronidazole .  IV hydration was provided per sepsis protocol.  Hospitalist was asked to admit patient for further evaluation and management.   Review of Systems: Review of systems as noted in the HPI. All other systems reviewed and are negative.     Hospital Course:     Brief Narrative:  53 y.o. female with medical history significant of   right lower extremity nonhealing ulcer follows orthopedist Dr. Julio Ohm as an outpatient, peripheral vascular disease, pressure ulcer of the right heel, ESRD on dialysis TTS schedule, chronic hypotension on midodrine , DM type II, hypothyroidism, OSA, thrombocytopenia, chronic diastolic heart failure, and recurrent C. difficile colitis admitted with generalized weakness and ongoing diarrhea, missed hemodialysis on 04/16/2024     -Assessment and Plan: 1)Recurrent C Diff--- C diff +ve on 01/14/24 , C diff Neg on 02/16/24 and Now C diff +ve on 04/18/24-- --discussed with ID physician Dr. Levern Reader, she recommends oral Vanco taper CT abdomen and pelvis without acute findings WBC 20.4 >>19.3 >>16.2 04/19/24 -Volume or consistency of stools have improved significantly although 1 loose BM in last 12 plus hours - Patient is afebrile and WBC trending down -No significant abdominal pain -Discussed with ID pharmacist and ID physician Dr. Levern Reader - Recommends 12-week oral Vanco taper (with  vancomycin  125 mg p.o. 4 times daily for 14 days, then vancomycin  125 mg twice daily for 7 days then vancomycin  125 mg daily for 7 days, then vancomycin  125 mg every other day for 28 days and then vancomycin  every 3 days for 28 days. -- At the completion of oral Vanco taper patient should consider outpatient Vowst (microbiota capsule)     2)ESRD--TTS schedule - Nephrology consult for HD appreciated - Tolerated HD well on 04/18/2024   3)Decubitus Ulcers--- see photos in epic -Treated with  Rocephin , Flagyl  -Stopped Levaquin  - Decubitus ulcers do not look particularly infected at this time -Given history of recurrent C. difficile be judicious with antibiotics - In the future patient advised to probably consider oral Vanco prophylaxis every time she has to go on systemic antibiotics  4)Anemia of ESRD - Hemoglobin around 9 -Defer decision on ESA/Procrit to nephrology service   5)Acquired hypothyroidism Continue Synthroid    6)DM2-A1c 5.6 reflecting excellent diabetic control PTA -Continue PTA regimen   7)HD exacerbated hypotension--- increase midodrine  to 15 mg 3 times daily especially on hemodialysis days -PTA patient was only taking midodrine  10 mg    8) hyponatremia--- sodium is stable around 129 at this time  -continue to use hemodialysis to address volume and electrolyte status    Disposition: The patient is from: Home       d/c is to: Home  Discharge Condition: stable  Follow UP   Follow-up Information     Liane Redman, MD. Schedule an appointment as soon as possible for a visit in 10 week(s).   Specialty: Infectious Diseases Why: Follow-up for C. difficile Contact information: [address] [place] [address] [phone]                  Consults obtained -ID and nephrology  Diet and Activity recommendation:  As advised  Discharge Instructions    Discharge Instructions     Call MD for:  difficulty breathing, headache or visual  disturbances   Complete by: As directed    Call MD for:  persistant dizziness or light-headedness   Complete by: As directed    Call MD for:  persistant nausea and vomiting   Complete by: As directed    Call MD for:  redness, tenderness, or signs of infection (pain, swelling, redness, odor or green/yellow discharge around incision site)   Complete by: As directed    Call MD for:  temperature >100.4   Complete by: As directed    Diet - low sodium heart healthy   Complete by: As directed    Diet Carb Modified   Complete by: As directed    Discharge instructions   Complete by: As directed    1)Wound Care---Cleanse R ischial wound with Vashe wound cleanser, do not rinse and allow to air dry.  Apply Medihoney to wound bed daily, cover with dry gauze and silicone foam or ABD pad whichever is preferred Interchangeable with TheraHoney Apply thin layer (3 mm) to wound. -- See above A) Cleanse R ischial wound with Vashe wound cleanser [name] [phone]), do not rinse and allow to air dry.  Apply Medihoney to wound bed daily, cover with dry gauze and silicone foam or ABD pad whichever is preferred  B)Cleanse Both lower legs with Vashe wound cleanser (do not rinse and allow to air dry). Apply Eucerin to legs 2 times daily. May cover any open wounds with Xeroform gauze [name] [phone]) and silicone foam if needed.  C)Cleanse R heel with Vashe, apply Xeroform gauze (Lawson [phone]) to wound bed every other day.  Cover with silicone foam heel protector. Place R foot in Prevalon boot [name] [phone]) to offload pressure.  2)Recommend 12-week oral Vancomycin  taper (with oral vancomycin  125 mg  4 times daily for 14 days, then vancomycin  125 mg twice daily for 7 days, then vancomycin  125 mg daily for 7 days, then vancomycin  125 mg every other day for 28 days and then vancomycin  every 3 days for 28 days. -- At the completion of  oral Vancomycin  taper patient should consider outpatient follow-up with GI provider for  outpatient Vowst (microbiota capsule)  3)Very Low-salt diet advised---Less than 2 gm of Sodium per day advised----ok to use Mrs DASH salt substitute instead of Salt 4)Weigh yourself daily, call if you gain more than 3 pounds in 1 day or more than 5 pounds in 1 week as your hemodialysis schedule may need to be adjusted 5)Limit your Fluid  intake to no more than 60 ounces (1.8 Liters) per day 6)-- At the completion of oral Vancomycin  taper patient should consider outpatient follow-up with GI provider for outpatient Vowst (microbiota capsule) 7)In the Future whenever you have to take systemic antibiotics for infection you should also be considered for oral vancomycin  use for C. Diff Prophylaxis as follows--Consider taking Oral Vancomycin  125 mg orally once daily during systemic antibiotic exposure and for about 7 days after completion of systemic antibiotic   Discharge wound care:   Complete by: As directed    See above 1) Cleanse R ischial wound with Vashe wound cleanser [name] [phone]), do not rinse and allow to air dry.  Apply Medihoney to wound bed daily, cover with dry gauze and silicone foam or ABD pad whichever is preferred  2)Cleanse Both lower legs with Vashe wound cleanser (do not rinse and allow to air dry). Apply Eucerin to legs 2 times daily. May cover any open wounds with Xeroform gauze [name] [phone]) and silicone foam if needed.  3)Cleanse R heel with Vashe, apply Xeroform gauze (Lawson [phone]) to wound bed every other day.  Cover with silicone foam heel protector. Place R foot in Prevalon boot [name] [phone]) to offload pressure.   Increase activity slowly   Complete by: As directed          Discharge Medications     Allergies as of 04/19/2024       Reactions   Ace Inhibitors Cough        Medication List     STOP taking these medications    Dificid  200 MG Tabs tablet Generic drug: fidaxomicin    doxycycline  100 MG tablet Commonly known as: VIBRA -TABS       TAKE  these medications    acetaminophen  325 MG tablet Commonly known as: TYLENOL  Take 2 tablets (650 mg total) by mouth every 6 (six) hours as needed for mild pain (pain score 1-3) (or Fever >/= 101).   acidophilus Caps capsule Take 2 capsules by mouth 2 (two) times daily. What changed: when to take this   ascorbic acid  500 MG tablet Commonly known as: VITAMIN C  Take 1 tablet (500 mg total) by mouth daily. Start taking on: Apr 20, 2024   atorvastatin  10 MG tablet Commonly known as: LIPITOR Take 10 mg by mouth every evening.   Auryxia  1 GM 210 MG(Fe) tablet Generic drug: ferric citrate  Take 630 mg by mouth 3 (three) times daily with meals.   Ensure Plant-Based Protein Liqd Take 1 Bottle by mouth 2 (two) times daily between meals.   HumaLOG  KwikPen 100 UNIT/ML KwikPen Generic drug: insulin  lispro Inject 5-11 Units into the skin 3 (three) times daily with meals. If eats 50% or more of meal. What changed: additional instructions   insulin  glargine 100 UNIT/ML injection Commonly known as: LANTUS  Inject 0.08 mLs (8 Units total) into the skin at bedtime.   leptospermum manuka honey Pste paste Apply 1 Application topically daily. Cleanse R ischial wound with Vashe wound cleanser, do not rinse and allow to air dry.  Apply Medihoney  to wound bed daily, cover with dry gauze and silicone foam or ABD pad whichever is preferred Interchangeable with TheraHoney Apply thin layer (3 mm) to wound.   levothyroxine  75 MCG tablet Commonly known as: SYNTHROID  Take 75 mcg by mouth daily.   midodrine  5 MG tablet Commonly known as: PROAMATINE  Take 3 tablets (15 mg total) by mouth 3 (three) times daily. What changed:  medication strength how much to take   vancomycin  125 MG capsule Commonly known as: VANCOCIN  Take 1 capsule (125 mg total) by mouth See admin instructions. 2-week oral Vanco taper (with vancomycin  125 mg p.o. 4 times daily for 14 days, then vancomycin  125 mg twice daily for 7 days,  then vancomycin  125 mg daily for 7 days, then vancomycin  125 mg every other day for 28 days and then vancomycin  every 3 days for 28 days.   Vitamin D  (Ergocalciferol ) 1.25 MG (50000 UNIT) Caps capsule Commonly known as: DRISDOL  Take 50,000 Units by mouth every 7 (seven) days. Friday   zinc  sulfate (50mg  elemental zinc ) 220 (50 Zn) MG capsule Take 1 capsule (220 mg total) by mouth daily. Start taking on: Apr 20, 2024               Discharge Care Instructions  (From admission, onward)           Start     Ordered   04/19/24 0000  Discharge wound care:       Comments: See above 1) Cleanse R ischial wound with Vashe wound cleanser [name] [phone]), do not rinse and allow to air dry.  Apply Medihoney to wound bed daily, cover with dry gauze and silicone foam or ABD pad whichever is preferred  2)Cleanse Both lower legs with Vashe wound cleanser (do not rinse and allow to air dry). Apply Eucerin to legs 2 times daily. May cover any open wounds with Xeroform gauze [name] [phone]) and silicone foam if needed.  3)Cleanse R heel with Vashe, apply Xeroform gauze (Lawson [phone]) to wound bed every other day.  Cover with silicone foam heel protector. Place R foot in Prevalon boot [name] [phone]) to offload pressure.   04/19/24 1014            Major procedures and Radiology Reports - PLEASE review detailed and final reports for all details, in brief -   CT ABDOMEN PELVIS WO CONTRAST Result Date: 04/17/2024 CLINICAL DATA:  Abdominal pain, acute, nonlocalized 5F when her home health nurse checked on her today, and Pt reports increasing weakness since Monday. EXAM: CT ABDOMEN AND PELVIS WITHOUT CONTRAST TECHNIQUE: Multidetector CT imaging of the abdomen and pelvis was performed following the standard protocol without IV contrast. RADIATION DOSE REDUCTION: This exam was performed according to the departmental dose-optimization program which includes automated exposure control, adjustment of the  mA and/or kV according to patient size and/or use of iterative reconstruction technique. COMPARISON:  CT abdomen pelvis 02/16/2024. FINDINGS: Lower chest: At least small volume pericardial effusion. Cardiac changes suggestive of anemia. Coronary artery calcification. Hepatobiliary: No focal liver abnormality. Status post cholecystectomy. No biliary dilatation. Pancreas: No focal lesion. Normal pancreatic contour. No surrounding inflammatory changes. No main pancreatic ductal dilatation. Spleen: Normal in size without focal abnormality. Adrenals/Urinary Tract: No adrenal nodule bilaterally. Bilateral renal scarring. No nephrolithiasis and no hydronephrosis. No definite contour-deforming renal mass. No ureterolithiasis or hydroureter. The urinary bladder is decompressed. Stomach/Bowel: Stomach is within normal limits. No evidence of bowel wall thickening or dilatation. Appendix appears normal. Vascular/Lymphatic: No abdominal aorta or  iliac aneurysm. Extensive atherosclerotic plaque of the aorta and its branches. No abdominal, pelvic, or inguinal lymphadenopathy. Reproductive: Lobulated uterine contour with calcified and enhancing fundal mass suggestive of a degenerative uterine fibroid. Otherwise uterus and bilateral adnexa are unremarkable. Other: No intraperitoneal free fluid. No intraperitoneal free gas. No organized fluid collection. Musculoskeletal: No abdominal wall hernia or abnormality. Calcified granulomas of the gluteal soft tissues. No suspicious lytic or blastic osseous lesions. No acute displaced fracture. Multilevel degenerative changes of the spine. IMPRESSION: 1. No acute intra-abdominal or intrapelvic abnormality with limited evaluation on this noncontrast study. 2. Uterine fibroids. 3.  Aortic Atherosclerosis (ICD10-I70.0)-extensive. Electronically Signed   By: Morgane  Naveau M.D.   On: 04/17/2024 18:50   DG Chest Port 1 View Result Date: 04/17/2024 CLINICAL DATA:  Questionable sepsis - evaluate  for abnormality EXAM: PORTABLE CHEST - 1 VIEW COMPARISON:  June 24, 2023 FINDINGS: No focal airspace consolidation, pleural effusion, or pneumothorax. Unchanged moderate cardiomegaly. Aortic atherosclerosis. No acute fracture or destructive lesion. IMPRESSION: Unchanged moderate cardiomegaly. Otherwise, no pneumonia or pulmonary edema. Electronically Signed   By: Rance Burrows M.D.   On: 04/17/2024 18:21    Micro Results   Recent Results (from the past 240 hours)  Resp panel by RT-PCR (RSV, Flu A&B, Covid) Anterior Nasal Swab     Status: None   Collection Time: 04/17/24  5:34 PM   Specimen: Anterior Nasal Swab  Result Value Ref Range Status   SARS Coronavirus 2 by RT PCR NEGATIVE NEGATIVE Final    Comment: (NOTE) SARS-CoV-2 target nucleic acids are NOT DETECTED.  The SARS-CoV-2 RNA is generally detectable in upper respiratory specimens during the acute phase of infection. The lowest concentration of SARS-CoV-2 viral copies this assay can detect is 138 copies/mL. A negative result does not preclude SARS-Cov-2 infection and should not be used as the sole basis for treatment or other patient management decisions. A negative result may occur with  improper specimen collection/handling, submission of specimen other than nasopharyngeal swab, presence of viral mutation(s) within the areas targeted by this assay, and inadequate number of viral copies(<138 copies/mL). A negative result must be combined with clinical observations, patient history, and epidemiological information. The expected result is Negative.  Fact Sheet for Patients:  BloggerCourse.com  Fact Sheet for Healthcare Providers:  SeriousBroker.it  This test is no t yet approved or cleared by the [place]  FDA and  has been authorized for detection and/or diagnosis of SARS-CoV-2 by FDA under an Emergency Use Authorization (EUA). This EUA will remain  in effect (meaning  this test can be used) for the duration of the COVID-19 declaration under Section 564(b)(1) of the Act, 21 U.S.C.section 360bbb-3(b)(1), unless the authorization is terminated  or revoked sooner.       Influenza A by PCR NEGATIVE NEGATIVE Final   Influenza B by PCR NEGATIVE NEGATIVE Final    Comment: (NOTE) The Xpert Xpress SARS-CoV-2/FLU/RSV plus assay is intended as an aid in the diagnosis of influenza from Nasopharyngeal swab specimens and should not be used as a sole basis for treatment. Nasal washings and aspirates are unacceptable for Xpert Xpress SARS-CoV-2/FLU/RSV testing.  Fact Sheet for Patients: BloggerCourse.com  Fact Sheet for Healthcare Providers: SeriousBroker.it  This test is not yet approved or cleared by the [place]  FDA and has been authorized for detection and/or diagnosis of SARS-CoV-2 by FDA under an Emergency Use Authorization (EUA). This EUA will remain in effect (meaning this test can be used) for the duration of the COVID-19  declaration under Section 564(b)(1) of the Act, 21 U.S.C. section 360bbb-3(b)(1), unless the authorization is terminated or revoked.     Resp Syncytial Virus by PCR NEGATIVE NEGATIVE Final    Comment: (NOTE) Fact Sheet for Patients: BloggerCourse.com  Fact Sheet for Healthcare Providers: SeriousBroker.it  This test is not yet approved or cleared by the [place]  FDA and has been authorized for detection and/or diagnosis of SARS-CoV-2 by FDA under an Emergency Use Authorization (EUA). This EUA will remain in effect (meaning this test can be used) for the duration of the COVID-19 declaration under Section 564(b)(1) of the Act, 21 U.S.C. section 360bbb-3(b)(1), unless the authorization is terminated or revoked.  Performed at [facility], [address] [place], [place] [address]   Blood Culture (routine x 2)      Status: None (Preliminary result)   Collection Time: 04/17/24  5:34 PM   Specimen: BLOOD  Result Value Ref Range Status   Specimen Description BLOOD BLOOD LEFT FOREARM  Final   Special Requests   Final    BOTTLES DRAWN AEROBIC AND ANAEROBIC Blood Culture adequate volume   Culture   Final    NO GROWTH < 24 HOURS Performed at [facility], [address], [name], [place] [address]    Report Status PENDING  Incomplete  Blood Culture (routine x 2)     Status: None (Preliminary result)   Collection Time: 04/17/24  6:04 PM   Specimen: BLOOD  Result Value Ref Range Status   Specimen Description BLOOD RIGHT ANTECUBITAL  Final   Special Requests   Final    BOTTLES DRAWN AEROBIC AND ANAEROBIC Blood Culture results may not be optimal due to an inadequate volume of blood received in culture bottles   Culture   Final    NO GROWTH < 12 HOURS Performed at [facility], [address], [place], [place] [address]    Report Status PENDING  Incomplete  MRSA Next Gen by PCR, Nasal     Status: None   Collection Time: 04/17/24  8:55 PM   Specimen: Nasal Mucosa; Nasal Swab  Result Value Ref Range Status   MRSA by PCR Next Gen NOT DETECTED NOT DETECTED Final    Comment: (NOTE) The GeneXpert MRSA Assay (FDA approved for NASAL specimens only), is one component of a comprehensive MRSA colonization surveillance program. It is not intended to diagnose MRSA infection nor to guide or monitor treatment for MRSA infections. Test performance is not FDA approved in patients less than 87 years old. Performed at [facility], [address] [place], [place] [address]   C Difficile Quick Screen w PCR reflex     Status: Abnormal   Collection Time: 04/18/24  9:47 AM   Specimen: STOOL  Result Value Ref Range Status   C Diff antigen POSITIVE (A) NEGATIVE Final   C Diff toxin POSITIVE (A) NEGATIVE Final   C Diff interpretation Toxin producing C. difficile detected.  Final    Comment: CRITICAL RESULT CALLED TO, READ  BACK BY AND VERIFIED WITH: C ROGERS AT 1110 04/18/24 BY A WILSON Performed at [name], [address] [place], [place] [address]    Today   Subjective    Amy Moses today has no new complaints - Eating and drinking okay No fever  Or chills  - 1 loose BM in the last 12 hours or so - Denies abdominal pain - No vomiting - Daughter Starlette Ebbs at bedside, questions answered  Patient has been seen and examined prior to discharge   Objective   Blood pressure (!) 106/56, pulse 68, temperature 98.1 F (36.7 C), temperature source Oral, resp. rate 15, height 5\' 6"  (1.676 m), weight 78.7 kg, last menstrual period 08/22/2015, SpO2 100%.   Intake/Output Summary (Last 24 hours) at 04/19/2024 1019 Last data filed at 04/19/2024 0812 Gross per 24 hour  Intake 477 ml  Output 0 ml  Net 477 ml    Exam Gen:- Awake Alert, no acute distress  HEENT:- [place].AT, No sclera icterus Neck-Supple Neck,No JVD,.  Lungs-  CTAB , good air movement bilaterally CV- S1, S2 normal, regular Abd-  +ve B.Sounds, Abd Soft, No tenderness,    Psych-affect is appropriate, oriented x3 Neuro-chronic neuromuscular deficits , no additional new focal deficits, no tremors  -MSK--Lt UE AVF with thrill and  bruit Skin--Decubitus ulcers--- see photos in epic   Data Review   CBC w Diff:  Lab Results  Component Value Date   WBC 16.2 (H) 04/19/2024   HGB 9.0 (L) 04/19/2024   HCT 29.0 (L) 04/19/2024   PLT 114 (L) 04/19/2024   LYMPHOPCT 3 04/17/2024   MONOPCT 8 04/17/2024   EOSPCT 1 04/17/2024   BASOPCT 0 04/17/2024   CMP:  Lab Results  Component Value Date   NA 129 (L) 04/18/2024   K 5.0 04/18/2024   CL 93 (L) 04/18/2024   CO2 21 (L) 04/18/2024   BUN 60 (H) 04/18/2024   CREATININE 8.84 (H) 04/18/2024   GLU 420 12/30/2021   PROT 5.6 (L) 04/18/2024   ALBUMIN  2.5 (L) 04/18/2024   BILITOT 0.9 04/18/2024   ALKPHOS 95 04/18/2024   AST 10 (L) 04/18/2024   ALT 8 04/18/2024   Total Discharge time  is about 33 minutes  Colin Dawley M.D on 04/19/2024 at 10:19 AM  Go to [email or web address] -  for contact info  [facility] - Office  [phone]

## 2024-04-19 NOTE — Care Management Important Message (Signed)
 Important Message  Patient Details  Name: Amy Moses MRN: [id number] Date of Birth: 07/04/1971   Important Message Given:  N/A - LOS <3 / Initial given by admissions     Kizzy Olafson L Dynasia Kercheval 04/19/2024, 12:34 PM

## 2024-04-19 NOTE — Discharge Instructions (Signed)
 1)Wound Care---Cleanse R ischial wound with Vashe wound cleanser, do not rinse and allow to air dry.  Apply Medihoney to wound bed daily, cover with dry gauze and silicone foam or ABD pad whichever is preferred Interchangeable with TheraHoney Apply thin layer (3 mm) to wound. -- See above A) Cleanse R ischial wound with Vashe wound cleanser [name] [phone]), do not rinse and allow to air dry.  Apply Medihoney to wound bed daily, cover with dry gauze and silicone foam or ABD pad whichever is preferred  B)Cleanse Both lower legs with Vashe wound cleanser (do not rinse and allow to air dry). Apply Eucerin to legs 2 times daily. May cover any open wounds with Xeroform gauze [name] [phone]) and silicone foam if needed.  C)Cleanse R heel with Vashe, apply Xeroform gauze (Lawson [phone]) to wound bed every other day.  Cover with silicone foam heel protector. Place R foot in Prevalon boot [name] [phone]) to offload pressure.  2)Recommend 12-week oral Vancomycin  taper (with oral vancomycin  125 mg  4 times daily for 14 days, then vancomycin  125 mg twice daily for 7 days, then vancomycin  125 mg daily for 7 days, then vancomycin  125 mg every other day for 28 days and then vancomycin  every 3 days for 28 days. -- At the completion of oral Vancomycin  taper patient should consider outpatient follow-up with GI provider for outpatient Vowst (microbiota capsule)  3)Very Low-salt diet advised---Less than 2 gm of Sodium per day advised----ok to use Mrs DASH salt substitute instead of Salt 4)Weigh yourself daily, call if you gain more than 3 pounds in 1 day or more than 5 pounds in 1 week as your hemodialysis schedule may need to be adjusted 5)Limit your Fluid  intake to no more than 60 ounces (1.8 Liters) per day 6)-- At the completion of oral Vancomycin  taper patient should consider outpatient follow-up with GI provider for outpatient Vowst (microbiota capsule) 7)In the Future whenever you have to take systemic antibiotics  for infection you should also be considered for oral vancomycin  use for C. Diff Prophylaxis as follows--Consider taking Oral Vancomycin  125 mg orally once daily during systemic antibiotic exposure and for about 7 days after completion of systemic antibiotic

## 2024-04-20 ENCOUNTER — Encounter (HOSPITAL_COMMUNITY): Payer: Self-pay

## 2024-04-20 ENCOUNTER — Inpatient Hospital Stay (HOSPITAL_COMMUNITY)

## 2024-04-20 ENCOUNTER — Other Ambulatory Visit: Payer: Self-pay

## 2024-04-20 ENCOUNTER — Inpatient Hospital Stay (HOSPITAL_COMMUNITY)
Admission: EM | Admit: 2024-04-20 | Discharge: 2024-04-21 | DRG: 871 | Disposition: E | Attending: Pulmonary Disease | Admitting: Pulmonary Disease

## 2024-04-20 ENCOUNTER — Emergency Department (HOSPITAL_COMMUNITY)

## 2024-04-20 DIAGNOSIS — E441 Mild protein-calorie malnutrition: Secondary | ICD-10-CM | POA: Diagnosis not present

## 2024-04-20 DIAGNOSIS — I5032 Chronic diastolic (congestive) heart failure: Secondary | ICD-10-CM | POA: Diagnosis present

## 2024-04-20 DIAGNOSIS — Z860100 Personal history of colon polyps, unspecified: Secondary | ICD-10-CM

## 2024-04-20 DIAGNOSIS — Z841 Family history of disorders of kidney and ureter: Secondary | ICD-10-CM

## 2024-04-20 DIAGNOSIS — R531 Weakness: Secondary | ICD-10-CM | POA: Diagnosis present

## 2024-04-20 DIAGNOSIS — Z66 Do not resuscitate: Secondary | ICD-10-CM | POA: Diagnosis present

## 2024-04-20 DIAGNOSIS — E785 Hyperlipidemia, unspecified: Secondary | ICD-10-CM | POA: Diagnosis present

## 2024-04-20 DIAGNOSIS — E8721 Acute metabolic acidosis: Secondary | ICD-10-CM | POA: Diagnosis not present

## 2024-04-20 DIAGNOSIS — A0471 Enterocolitis due to Clostridium difficile, recurrent: Secondary | ICD-10-CM | POA: Diagnosis present

## 2024-04-20 DIAGNOSIS — I272 Pulmonary hypertension, unspecified: Secondary | ICD-10-CM | POA: Diagnosis present

## 2024-04-20 DIAGNOSIS — E039 Hypothyroidism, unspecified: Secondary | ICD-10-CM | POA: Diagnosis present

## 2024-04-20 DIAGNOSIS — I469 Cardiac arrest, cause unspecified: Secondary | ICD-10-CM | POA: Diagnosis not present

## 2024-04-20 DIAGNOSIS — I468 Cardiac arrest due to other underlying condition: Secondary | ICD-10-CM | POA: Diagnosis present

## 2024-04-20 DIAGNOSIS — E119 Type 2 diabetes mellitus without complications: Secondary | ICD-10-CM

## 2024-04-20 DIAGNOSIS — I132 Hypertensive heart and chronic kidney disease with heart failure and with stage 5 chronic kidney disease, or end stage renal disease: Secondary | ICD-10-CM | POA: Diagnosis present

## 2024-04-20 DIAGNOSIS — Z8711 Personal history of peptic ulcer disease: Secondary | ICD-10-CM

## 2024-04-20 DIAGNOSIS — Z794 Long term (current) use of insulin: Secondary | ICD-10-CM

## 2024-04-20 DIAGNOSIS — Z8249 Family history of ischemic heart disease and other diseases of the circulatory system: Secondary | ICD-10-CM

## 2024-04-20 DIAGNOSIS — D631 Anemia in chronic kidney disease: Secondary | ICD-10-CM | POA: Diagnosis present

## 2024-04-20 DIAGNOSIS — H9313 Tinnitus, bilateral: Secondary | ICD-10-CM | POA: Diagnosis present

## 2024-04-20 DIAGNOSIS — Z992 Dependence on renal dialysis: Secondary | ICD-10-CM | POA: Diagnosis not present

## 2024-04-20 DIAGNOSIS — N186 End stage renal disease: Secondary | ICD-10-CM | POA: Diagnosis present

## 2024-04-20 DIAGNOSIS — A414 Sepsis due to anaerobes: Secondary | ICD-10-CM | POA: Diagnosis present

## 2024-04-20 DIAGNOSIS — Z833 Family history of diabetes mellitus: Secondary | ICD-10-CM

## 2024-04-20 DIAGNOSIS — I959 Hypotension, unspecified: Principal | ICD-10-CM

## 2024-04-20 DIAGNOSIS — A419 Sepsis, unspecified organism: Secondary | ICD-10-CM

## 2024-04-20 DIAGNOSIS — N179 Acute kidney failure, unspecified: Secondary | ICD-10-CM | POA: Diagnosis present

## 2024-04-20 DIAGNOSIS — E1122 Type 2 diabetes mellitus with diabetic chronic kidney disease: Secondary | ICD-10-CM | POA: Diagnosis present

## 2024-04-20 DIAGNOSIS — Z8701 Personal history of pneumonia (recurrent): Secondary | ICD-10-CM

## 2024-04-20 DIAGNOSIS — A0472 Enterocolitis due to Clostridium difficile, not specified as recurrent: Secondary | ICD-10-CM | POA: Diagnosis not present

## 2024-04-20 DIAGNOSIS — Z79890 Hormone replacement therapy (postmenopausal): Secondary | ICD-10-CM

## 2024-04-20 DIAGNOSIS — Z515 Encounter for palliative care: Secondary | ICD-10-CM

## 2024-04-20 DIAGNOSIS — E872 Acidosis, unspecified: Secondary | ICD-10-CM | POA: Diagnosis present

## 2024-04-20 DIAGNOSIS — G4733 Obstructive sleep apnea (adult) (pediatric): Secondary | ICD-10-CM | POA: Diagnosis present

## 2024-04-20 DIAGNOSIS — E871 Hypo-osmolality and hyponatremia: Secondary | ICD-10-CM | POA: Diagnosis present

## 2024-04-20 DIAGNOSIS — I340 Nonrheumatic mitral (valve) insufficiency: Secondary | ICD-10-CM | POA: Diagnosis present

## 2024-04-20 DIAGNOSIS — R579 Shock, unspecified: Secondary | ICD-10-CM

## 2024-04-20 DIAGNOSIS — E440 Moderate protein-calorie malnutrition: Secondary | ICD-10-CM | POA: Diagnosis present

## 2024-04-20 DIAGNOSIS — R571 Hypovolemic shock: Secondary | ICD-10-CM | POA: Diagnosis present

## 2024-04-20 DIAGNOSIS — R6521 Severe sepsis with septic shock: Secondary | ICD-10-CM | POA: Diagnosis present

## 2024-04-20 DIAGNOSIS — Z888 Allergy status to other drugs, medicaments and biological substances status: Secondary | ICD-10-CM

## 2024-04-20 DIAGNOSIS — H5461 Unqualified visual loss, right eye, normal vision left eye: Secondary | ICD-10-CM | POA: Diagnosis present

## 2024-04-20 DIAGNOSIS — Z79899 Other long term (current) drug therapy: Secondary | ICD-10-CM

## 2024-04-20 DIAGNOSIS — H409 Unspecified glaucoma: Secondary | ICD-10-CM | POA: Diagnosis present

## 2024-04-20 DIAGNOSIS — Z1152 Encounter for screening for COVID-19: Secondary | ICD-10-CM

## 2024-04-20 LAB — CBC WITH DIFFERENTIAL/PLATELET
Abs Immature Granulocytes: 0.72 10*3/uL — ABNORMAL HIGH (ref 0.00–0.07)
Abs Immature Granulocytes: 3.16 10*3/uL — ABNORMAL HIGH (ref 0.00–0.07)
Basophils Absolute: 0.1 10*3/uL (ref 0.0–0.1)
Basophils Absolute: 0 10*3/uL (ref 0.0–0.1)
Basophils Relative: 0 %
Basophils Relative: 0 %
Eosinophils Absolute: 0.1 10*3/uL (ref 0.0–0.5)
Eosinophils Absolute: 0.1 10*3/uL (ref 0.0–0.5)
Eosinophils Relative: 0 %
Eosinophils Relative: 0 %
HCT: 30.2 % — ABNORMAL LOW (ref 36.0–46.0)
HCT: 33.9 % — ABNORMAL LOW (ref 36.0–46.0)
Hemoglobin: 9.9 g/dL — ABNORMAL LOW (ref 12.0–15.0)
Hemoglobin: 10.5 g/dL — ABNORMAL LOW (ref 12.0–15.0)
Immature Granulocytes: 2 %
Immature Granulocytes: 7 %
Lymphocytes Relative: 3 %
Lymphocytes Relative: 4 %
Lymphs Abs: 0.9 10*3/uL (ref 0.7–4.0)
Lymphs Abs: 2 10*3/uL (ref 0.7–4.0)
MCH: 28.2 pg (ref 26.0–34.0)
MCH: 28.3 pg (ref 26.0–34.0)
MCHC: 32.8 g/dL (ref 30.0–36.0)
MCHC: 31 g/dL (ref 30.0–36.0)
MCV: 86 fL (ref 80.0–100.0)
MCV: 91.4 fL (ref 80.0–100.0)
Monocytes Absolute: 2.2 10*3/uL — ABNORMAL HIGH (ref 0.1–1.0)
Monocytes Absolute: 2.1 10*3/uL — ABNORMAL HIGH (ref 0.1–1.0)
Monocytes Relative: 7 %
Monocytes Relative: 4 %
Neutro Abs: 26.4 10*3/uL — ABNORMAL HIGH (ref 1.7–7.7)
Neutro Abs: 40.3 10*3/uL — ABNORMAL HIGH (ref 1.7–7.7)
Neutrophils Relative %: 88 %
Neutrophils Relative %: 85 %
Platelets: 161 10*3/uL (ref 150–400)
Platelets: 201 10*3/uL (ref 150–400)
RBC: 3.51 MIL/uL — ABNORMAL LOW (ref 3.87–5.11)
RBC: 3.71 MIL/uL — ABNORMAL LOW (ref 3.87–5.11)
RDW: 14.2 % (ref 11.5–15.5)
RDW: 14.7 % (ref 11.5–15.5)
Smear Review: NORMAL
Smear Review: NORMAL
WBC Morphology: INCREASED
WBC: 30.5 10*3/uL — ABNORMAL HIGH (ref 4.0–10.5)
WBC: 47.7 10*3/uL — ABNORMAL HIGH (ref 4.0–10.5)
nRBC: 0 % (ref 0.0–0.2)
nRBC: 0.1 % (ref 0.0–0.2)

## 2024-04-20 LAB — COMPREHENSIVE METABOLIC PANEL WITH GFR
ALT: 9 U/L (ref 0–44)
ALT: 105 U/L — ABNORMAL HIGH (ref 0–44)
AST: 19 U/L (ref 15–41)
AST: 305 U/L — ABNORMAL HIGH (ref 15–41)
Albumin: 2.3 g/dL — ABNORMAL LOW (ref 3.5–5.0)
Albumin: 2 g/dL — ABNORMAL LOW (ref 3.5–5.0)
Alkaline Phosphatase: 87 U/L (ref 38–126)
Alkaline Phosphatase: 112 U/L (ref 38–126)
Anion gap: 18 — ABNORMAL HIGH (ref 5–15)
Anion gap: 23 — ABNORMAL HIGH (ref 5–15)
BUN: 52 mg/dL — ABNORMAL HIGH (ref 6–20)
BUN: 51 mg/dL — ABNORMAL HIGH (ref 6–20)
CO2: 20 mmol/L — ABNORMAL LOW (ref 22–32)
CO2: 13 mmol/L — ABNORMAL LOW (ref 22–32)
Calcium: 9.1 mg/dL (ref 8.9–10.3)
Calcium: 8.3 mg/dL — ABNORMAL LOW (ref 8.9–10.3)
Chloride: 92 mmol/L — ABNORMAL LOW (ref 98–111)
Chloride: 94 mmol/L — ABNORMAL LOW (ref 98–111)
Creatinine, Ser: 7.48 mg/dL — ABNORMAL HIGH (ref 0.44–1.00)
Creatinine, Ser: 6.95 mg/dL — ABNORMAL HIGH (ref 0.44–1.00)
GFR, Estimated: 6 mL/min — ABNORMAL LOW (ref >60)
GFR, Estimated: 7 mL/min — ABNORMAL LOW (ref >60)
Glucose, Bld: 74 mg/dL (ref 70–99)
Glucose, Bld: 191 mg/dL — ABNORMAL HIGH (ref 70–99)
Potassium: 4.6 mmol/L (ref 3.5–5.1)
Potassium: 3.6 mmol/L (ref 3.5–5.1)
Sodium: 130 mmol/L — ABNORMAL LOW (ref 135–145)
Sodium: 130 mmol/L — ABNORMAL LOW (ref 135–145)
Total Bilirubin: 1.6 mg/dL — ABNORMAL HIGH (ref 0.0–1.2)
Total Bilirubin: 2.2 mg/dL — ABNORMAL HIGH (ref 0.0–1.2)
Total Protein: 5.4 g/dL — ABNORMAL LOW (ref 6.5–8.1)
Total Protein: 4.8 g/dL — ABNORMAL LOW (ref 6.5–8.1)

## 2024-04-20 LAB — RESP PANEL BY RT-PCR (RSV, FLU A&B, COVID)  RVPGX2
Influenza A by PCR: NEGATIVE
Influenza B by PCR: NEGATIVE
Resp Syncytial Virus by PCR: NEGATIVE
SARS Coronavirus 2 by RT PCR: NEGATIVE

## 2024-04-20 LAB — PROCALCITONIN: Procalcitonin: 25.11 ng/mL

## 2024-04-20 LAB — I-STAT CG4 LACTIC ACID, ED
Lactic Acid, Venous: 1.7 mmol/L (ref 0.5–1.9)
Lactic Acid, Venous: 1.4 mmol/L (ref 0.5–1.9)

## 2024-04-20 LAB — I-STAT ARTERIAL BLOOD GAS, ED
Acid-base deficit: 20 mmol/L — ABNORMAL HIGH (ref 0.0–2.0)
Bicarbonate: 10.9 mmol/L — ABNORMAL LOW (ref 20.0–28.0)
Calcium, Ion: 1.1 mmol/L — ABNORMAL LOW (ref 1.15–1.40)
HCT: 34 % — ABNORMAL LOW (ref 36.0–46.0)
Hemoglobin: 11.6 g/dL — ABNORMAL LOW (ref 12.0–15.0)
O2 Saturation: 99 %
Patient temperature: 97.7
Potassium: 4.3 mmol/L (ref 3.5–5.1)
Sodium: 130 mmol/L — ABNORMAL LOW (ref 135–145)
TCO2: 12 mmol/L — ABNORMAL LOW (ref 22–32)
pCO2 arterial: 43.9 mmHg (ref 32–48)
pH, Arterial: 6.998 — CL (ref 7.35–7.45)
pO2, Arterial: 178 mmHg — ABNORMAL HIGH (ref 83–108)

## 2024-04-20 LAB — MRSA NEXT GEN BY PCR, NASAL: MRSA by PCR Next Gen: NOT DETECTED

## 2024-04-20 LAB — LACTIC ACID, PLASMA: Lactic Acid, Venous: 8.3 mmol/L (ref 0.5–1.9)

## 2024-04-20 LAB — CBG MONITORING, ED: Glucose-Capillary: 91 mg/dL (ref 70–99)

## 2024-04-20 LAB — MAGNESIUM: Magnesium: 1.9 mg/dL (ref 1.7–2.4)

## 2024-04-20 LAB — PROTIME-INR
INR: 1.4 — ABNORMAL HIGH (ref 0.8–1.2)
Prothrombin Time: 17.4 s — ABNORMAL HIGH (ref 11.4–15.2)

## 2024-04-20 LAB — TROPONIN I (HIGH SENSITIVITY): Troponin I (High Sensitivity): 498 ng/L (ref <18)

## 2024-04-20 MED ORDER — IOHEXOL 350 MG/ML SOLN
75.0000 mL | Freq: Once | INTRAVENOUS | Status: DC | PRN
Start: 2024-04-20 — End: 2024-04-20

## 2024-04-20 MED ORDER — IOHEXOL 350 MG/ML SOLN
75.0000 mL | Freq: Once | INTRAVENOUS | Status: AC | PRN
  Administered 2024-04-20: 75 mL via INTRAVENOUS

## 2024-04-20 MED ORDER — SODIUM BICARBONATE 8.4 % IV SOLN
INTRAVENOUS | Status: DC
  Filled 2024-04-20 (×2): qty 1000

## 2024-04-20 MED ORDER — SODIUM CHLORIDE 0.9 % IV BOLUS
500.0000 mL | Freq: Once | INTRAVENOUS | Status: AC
  Administered 2024-04-20: 500 mL via INTRAVENOUS

## 2024-04-20 MED ORDER — VANCOMYCIN HCL IN DEXTROSE 1-5 GM/200ML-% IV SOLN
1000.0000 mg | Freq: Once | INTRAVENOUS | Status: DC
  Filled 2024-04-20: qty 200

## 2024-04-20 MED ORDER — EPINEPHRINE 1 MG/10ML IJ SOSY
PREFILLED_SYRINGE | INTRAMUSCULAR | Status: AC | PRN
  Administered 2024-04-20: 1 mg via INTRAVENOUS

## 2024-04-20 MED ORDER — SODIUM BICARBONATE 8.4 % IV SOLN
INTRAVENOUS | Status: DC | PRN
  Administered 2024-04-20: 50 meq via INTRAVENOUS

## 2024-04-20 MED ORDER — LACTATED RINGERS IV SOLN: INTRAVENOUS | Status: DC

## 2024-04-20 MED ORDER — EPINEPHRINE HCL 5 MG/250ML IV SOLN IN NS
INTRAVENOUS | Status: AC
  Administered 2024-04-20: 10 ug/min via INTRAVENOUS
  Filled 2024-04-20: qty 250

## 2024-04-20 MED ORDER — VANCOMYCIN HCL 125 MG PO CAPS
125.0000 mg | ORAL_CAPSULE | Freq: Four times a day (QID) | ORAL | Status: DC
Start: 2024-04-20 — End: 2024-04-30
  Filled 2024-04-20 (×2): qty 1

## 2024-04-20 MED ORDER — DOCUSATE SODIUM 100 MG PO CAPS: 100.0000 mg | ORAL_CAPSULE | Freq: Two times a day (BID) | ORAL | Status: DC | PRN

## 2024-04-20 MED ORDER — ORAL CARE MOUTH RINSE: 15.0000 mL | OROMUCOSAL | Status: DC

## 2024-04-20 MED ORDER — EPINEPHRINE HCL 5 MG/250ML IV SOLN IN NS
0.5000 ug/min | INTRAVENOUS | Status: DC
  Administered 2024-04-20: 40 ug/min via INTRAVENOUS
  Filled 2024-04-20: qty 250

## 2024-04-20 MED ORDER — VANCOMYCIN HCL 1500 MG/300ML IV SOLN
1500.0000 mg | Freq: Once | INTRAVENOUS | Status: AC
  Administered 2024-04-20: 1500 mg via INTRAVENOUS
  Filled 2024-04-20 (×2): qty 300

## 2024-04-20 MED ORDER — PRISMASOL BGK 4/2.5 32-4-2.5 MEQ/L EC SOLN
Status: DC
Start: 2024-04-20 — End: 2024-04-21

## 2024-04-20 MED ORDER — EPINEPHRINE 1 MG/10ML IJ SOSY
PREFILLED_SYRINGE | INTRAMUSCULAR | Status: DC | PRN
  Administered 2024-04-20: 1 mg via INTRAVENOUS

## 2024-04-20 MED ORDER — CHLORHEXIDINE GLUCONATE CLOTH 2 % EX PADS
6.0000 | MEDICATED_PAD | Freq: Every day | CUTANEOUS | Status: DC
  Administered 2024-04-20: 6 via TOPICAL

## 2024-04-20 MED ORDER — HEPARIN SODIUM (PORCINE) 1000 UNIT/ML DIALYSIS: 1000.0000 [IU] | INTRAMUSCULAR | Status: DC | PRN

## 2024-04-20 MED ORDER — MIDODRINE HCL 5 MG PO TABS
15.0000 mg | ORAL_TABLET | Freq: Three times a day (TID) | ORAL | Status: DC
  Administered 2024-04-20: 15 mg via ORAL
  Filled 2024-04-20: qty 3

## 2024-04-20 MED ORDER — STERILE WATER FOR INJECTION IV SOLN
INTRAVENOUS | Status: DC
Start: 2024-04-20 — End: 2024-04-21
  Filled 2024-04-20: qty 150

## 2024-04-20 MED ORDER — METRONIDAZOLE 500 MG/100ML IV SOLN
500.0000 mg | Freq: Once | INTRAVENOUS | Status: AC
  Administered 2024-04-20: 500 mg via INTRAVENOUS
  Filled 2024-04-20: qty 100

## 2024-04-20 MED ORDER — ONDANSETRON HCL 4 MG/2ML IJ SOLN
4.0000 mg | Freq: Once | INTRAMUSCULAR | Status: AC
  Administered 2024-04-20: 4 mg via INTRAVENOUS

## 2024-04-20 MED ORDER — SODIUM BICARBONATE 8.4 % IV SOLN
INTRAVENOUS | Status: AC
  Filled 2024-04-20: qty 150

## 2024-04-20 MED ORDER — LACTATED RINGERS IV BOLUS
1000.0000 mL | Freq: Once | INTRAVENOUS | Status: AC
  Administered 2024-04-20: 1000 mL via INTRAVENOUS

## 2024-04-20 MED ORDER — EPINEPHRINE HCL 5 MG/250ML IV SOLN IN NS: 0.5000 ug/min | INTRAVENOUS | Status: DC

## 2024-04-20 MED ORDER — DEXTROSE 50 % IV SOLN
INTRAVENOUS | Status: AC
  Administered 2024-04-20: 12.5 g via INTRAVENOUS
  Filled 2024-04-20: qty 50

## 2024-04-20 MED ORDER — HEPARIN SODIUM (PORCINE) 5000 UNIT/ML IJ SOLN: 5000.0000 [IU] | Freq: Three times a day (TID) | INTRAMUSCULAR | Status: DC

## 2024-04-20 MED ORDER — EPINEPHRINE 1 MG/10ML IJ SOSY
PREFILLED_SYRINGE | INTRAMUSCULAR | Status: AC | PRN
  Administered 2024-04-20 (×2): 1 mg via INTRAVENOUS

## 2024-04-20 MED ORDER — POLYETHYLENE GLYCOL 3350 17 G PO PACK: 17.0000 g | PACK | Freq: Every day | ORAL | Status: DC | PRN

## 2024-04-20 MED ORDER — ORAL CARE MOUTH RINSE: 15.0000 mL | OROMUCOSAL | Status: DC | PRN

## 2024-04-20 MED ORDER — DEXTROSE 50 % IV SOLN: 12.5000 g | INTRAVENOUS | Status: AC

## 2024-04-20 MED ORDER — FENTANYL CITRATE PF 50 MCG/ML IJ SOSY: 25.0000 ug | PREFILLED_SYRINGE | Freq: Once | INTRAMUSCULAR | Status: DC

## 2024-04-20 MED ORDER — FENTANYL BOLUS VIA INFUSION: 25.0000 ug | INTRAVENOUS | Status: DC | PRN

## 2024-04-20 MED ORDER — MIDODRINE HCL 5 MG PO TABS: 15.0000 mg | ORAL_TABLET | Freq: Three times a day (TID) | ORAL | Status: DC

## 2024-04-20 MED ORDER — PANTOPRAZOLE SODIUM 40 MG IV SOLR: 40.0000 mg | INTRAVENOUS | Status: DC

## 2024-04-20 MED ORDER — FENTANYL 2500MCG IN NS 250ML (10MCG/ML) PREMIX INFUSION: 0.0000 ug/h | INTRAVENOUS | Status: DC

## 2024-04-20 MED ORDER — PHENYLEPHRINE 80 MCG/ML (10ML) SYRINGE FOR IV PUSH (FOR BLOOD PRESSURE SUPPORT)
PREFILLED_SYRINGE | INTRAVENOUS | Status: AC
  Filled 2024-04-20: qty 20

## 2024-04-20 MED ORDER — PROPOFOL 1000 MG/100ML IV EMUL: 0.0000 ug/kg/min | INTRAVENOUS | Status: DC

## 2024-04-20 MED ORDER — EPINEPHRINE 1 MG/10ML IJ SOSY
PREFILLED_SYRINGE | INTRAMUSCULAR | Status: AC
  Filled 2024-04-20: qty 20

## 2024-04-20 MED ORDER — ETOMIDATE 2 MG/ML IV SOLN
20.0000 mg | Freq: Once | INTRAVENOUS | Status: AC
  Administered 2024-04-20: 20 mg via INTRAVENOUS

## 2024-04-20 MED ORDER — NOREPINEPHRINE 4 MG/250ML-% IV SOLN
0.0000 ug/min | INTRAVENOUS | Status: DC
  Administered 2024-04-20: 2 ug/min via INTRAVENOUS
  Administered 2024-04-20 (×3): 40 ug/min via INTRAVENOUS
  Filled 2024-04-20: qty 500
  Filled 2024-04-20: qty 250

## 2024-04-20 MED ORDER — FENTANYL CITRATE PF 50 MCG/ML IJ SOSY: 50.0000 ug | PREFILLED_SYRINGE | INTRAMUSCULAR | Status: DC | PRN

## 2024-04-20 MED ORDER — VASOPRESSIN 20 UNITS/100 ML INFUSION FOR SHOCK
0.0000 [IU]/min | INTRAVENOUS | Status: DC
  Administered 2024-04-20: 0.03 [IU]/min via INTRAVENOUS

## 2024-04-20 MED ORDER — SODIUM CHLORIDE 0.9 % IV SOLN
2.0000 g | Freq: Once | INTRAVENOUS | Status: AC
  Administered 2024-04-20: 2 g via INTRAVENOUS
  Filled 2024-04-20: qty 12.5

## 2024-04-20 MED ORDER — LEVOTHYROXINE SODIUM 25 MCG PO TABS: 75.0000 ug | ORAL_TABLET | Freq: Every day | ORAL | Status: DC

## 2024-04-20 MED ORDER — ROCURONIUM BROMIDE 10 MG/ML (PF) SYRINGE
90.0000 mg | PREFILLED_SYRINGE | Freq: Once | INTRAVENOUS | Status: AC
  Administered 2024-04-20: 90 mg via INTRAVENOUS

## 2024-04-20 MED ORDER — SODIUM BICARBONATE 8.4 % IV SOLN
INTRAVENOUS | Status: AC | PRN
  Administered 2024-04-20: 50 meq via INTRAVENOUS

## 2024-04-20 MED ORDER — METRONIDAZOLE 500 MG/100ML IV SOLN: 500.0000 mg | Freq: Three times a day (TID) | INTRAVENOUS | Status: DC

## 2024-04-21 LAB — GLUCOSE, CAPILLARY
Glucose-Capillary: 111 mg/dL — ABNORMAL HIGH (ref 70–99)
Glucose-Capillary: 65 mg/dL — ABNORMAL LOW (ref 70–99)

## 2024-04-21 NOTE — Progress Notes (Signed)
 Pt transported to 88M via vent with no complications noted during transport.

## 2024-04-21 NOTE — Progress Notes (Signed)
 eLink Physician-Brief Progress Note Patient Name: Amy Moses DOB: 1971/10/19 MRN: [id number]   Date of Service  03/25/2024  HPI/Events of Note  53 year old female with a history of ESRD on hemodialysis, diabetes mellitus, pulmonary hypertension and heart failure who presented to the emergency department with hypotension and had brief cardiac arrest x 2 with 1 round of CPR in the setting of hypovolemic shock from C. difficile colitis.  On examination, she is tachycardic but normotensive saturating 100% on mechanical ventilation.  Currently on 3 pressors at maximal doses.  Remains severely acidemic.  Earlier results show elevated creatinine, anion gap metabolic acidosis, hypocalcemia, leukocytosis with normocytic anemia.  eICU Interventions  Increase respiratory rate to 28  Just initiated bicarb infusion, likely need to talk to nephrology about CRRT.  AV fistula in place  GOC conversations ongoing  Maintain vancomycin  enteral, add IV Flagyl   Place feeding tube for medications  DVT prophylaxis on heparin  GI prophylaxis with pantoprazole      Intervention Category Evaluation Type: New Patient Evaluation  Amy Moses 04/15/2024, 8:16 PM

## 2024-04-21 NOTE — ED Notes (Signed)
 Amp of bi carb given

## 2024-04-21 NOTE — Significant Event (Signed)
 Consult note to follow: 58F with C diff colitis, discharged yesterday. iHD, last dialysis 05/29. Labs all ok except significant leukocytosis increased from yesterday (WBC 30.5 from 16.2) on admission. Chronic hypotension, BP 79/40 on admit, P 64, afebrile. Lactate normal and on repeat normal. ECG with ST changes. PEA arrested x2. Repeat ECG without STEMI. Multiple decub ulcers. Family wanted FULL code. Placed 5 Fr RFA access, triple lumen RFV. Lost pulsatility on A-line, resumed compressions again. On epi+levophed. Most likely septic shock, Bcx already collected, on broad spectrum abx. For now no indication for ACS. Didn't have any chest sx until she became briefly responsive after first CPR then had chest pain (following compressions).

## 2024-04-21 NOTE — H&P (Signed)
 NAME:  Amy Moses, MRN:  [id number], DOB:  1971-06-12, LOS: 0 ADMISSION DATE:  04/13/2024, CONSULTATION DATE:  04/17/2024 REFERRING MD:  EDP, CHIEF COMPLAINT:  Septic Shock   History of Present Illness:  Amy Moses is a 53 year old woman with ESRD on HD, DMII pulmonary hypertension and diastolic heart failure who presented to the ED with low blood pressure despite taking midodrine . She was discharged from [name] yesterday where she was admitted for ischial wound and found to have c. Difficile colitis and started on oral vancomycin . She continues to have multiple episodes of diarrhea per day.   She remained hypotensive in the ER despite 1L fluid bolus and PCCM called for admission due to septic shock from c. Difficile colitis.   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   Chronic diastolic heart failure (HCC) 03/30/2021   Diabetes mellitus, type II (HCC)    Dyslipidemia    ESRD on hemodialysis (HCC)    TTS in [place] , [place]   Glaucoma    History of blood transfusion    Hypertension    Hypothyroidism (acquired)    Mitral regurgitation 11/16/2022   Pneumonia    Pulmonary hypertension, unspecified (HCC) 09/12/2022     Significant Hospital Events: Including procedures, antibiotic start and stop dates in addition to other pertinent events   5/31 presented to ER with hypotension, two cardiac arrests in ER, intubated  Interim History / Subjective:   Patient developed two episodes of cardiac arrest with return of pulse after 1 round of CPR each. She was subsequently intubated to protect her airway. She was complaining of chest pain prior to intubation.   Objective    Blood pressure (!) 88/49, pulse 66, temperature 97.7 F (36.5 C), temperature source Oral, resp. rate 19, last menstrual period 08/22/2015, SpO2 95%.        Intake/Output  Summary (Last 24 hours) at 04/13/2024 1751 Last data filed at 04/06/2024 1739 Gross per 24 hour  Intake 1100 ml  Output --  Net 1100 ml   There were no vitals filed for this visit.  Examination: General: older than stated age, acute distress HENT: [place]/AT, dry mucous membranes Lungs: clear to auscultation, no wheezing Cardiovascular: tachycardic Abdomen: distended, soft, non-tender Extremities: warm, leg wraps in place Neuro: alert, conversant, moving all extremities GU: n/a  Resolved problem list   Assessment and Plan   Septic Shock Due to Severe C. Difficile Colitis  - Continue IV fluid resuscitation - Continue oral vancomycin  - CT Chest/abdomen/pelvis pending   Cardiac Arrest Hx of pulmonary hypertension Hx of diastolic heart failure Etiology unknown, possibly ACS vs stress from sepsis/metabolic derangements vs arrythmia - Cardiology consulted - trend EKGs and troponins - check echo  ESRD on HD - consult nephrology for inpatient HD needs  Anion Gap Metabolic Acidosis Hyponatremia - fluid resusctiation as above - trend labs  DMII - CBG q4hrs - add sliding scale if needed  Mild to Moderate protein Calorie Malnutrition - Consult dietician -start tube feeds tomorrow  Hx of OSA - consider CPAP at night once extubated  Best Practice (right click and "Reselect all SmartList Selections" daily)   Diet/type: NPO w/ oral meds DVT prophylaxis prophylactic heparin   Pressure ulcer(s): N/A GI prophylaxis: N/A Lines: N/A Foley:  N/A Code Status:  full code Last date of multidisciplinary goals of  care discussion [discussed with patient in ER]  Labs   CBC: Recent Labs  Lab 04/17/24 1635 04/17/24 1754 04/18/24 0425 04/19/24 0833 04/15/2024 1328  WBC 20.4*  --  19.3* 16.2* 30.5*  NEUTROABS 17.3*  --   --   --  26.4*  HGB 11.2* 12.2 9.5* 9.0* 9.9*  HCT 35.2* 36.0 29.7* 29.0* 30.2*  MCV 89.1  --  87.9 88.7 86.0  PLT 143*  --  128* 114* 161    Basic Metabolic  Panel: Recent Labs  Lab 04/17/24 1635 04/17/24 1754 04/18/24 0425 04/12/2024 1328  NA 128* 128* 129* 130*  K 4.8 4.6 5.0 4.6  CL 89* 95* 93* 92*  CO2 20*  --  21* 20*  GLUCOSE 89 94 124* 74  BUN 54* 47* 60* 52*  CREATININE 9.03* 9.40* 8.84* 7.48*  CALCIUM  9.1  --  8.6* 9.1  MG  --   --  1.9  --   PHOS  --   --  7.7*  --    GFR: Estimated Creatinine Clearance: 9.5 mL/min (A) (by C-G formula based on SCr of 7.48 mg/dL (H)). Recent Labs  Lab 04/17/24 1635 04/17/24 1734 04/18/24 0425 04/19/24 0833 04/16/2024 1328 03/31/2024 1551 03/28/2024 1744  WBC 20.4*  --  19.3* 16.2* 30.5*  --   --   LATICACIDVEN  --  1.3  --   --   --  1.7 1.4    Liver Function Tests: Recent Labs  Lab 04/17/24 1635 04/18/24 0425 03/29/2024 1328  AST 10* 10* 19  ALT 8 8 9   ALKPHOS 98 95 87  BILITOT 1.5* 0.9 1.6*  PROT 6.7 5.6* 5.4*  ALBUMIN  3.1* 2.5* 2.3*   No results for input(s): "LIPASE", "AMYLASE" in the last 168 hours. No results for input(s): "AMMONIA" in the last 168 hours.  ABG    Component Value Date/Time   HCO3 28.4 (H) 01/24/2022 2323   TCO2 21 (L) 04/17/2024 1754   O2SAT 78.1 01/24/2022 2323     Coagulation Profile: Recent Labs  Lab 04/17/24 1635 04/01/2024 1520  INR 1.3* 1.4*    Cardiac Enzymes: No results for input(s): "CKTOTAL", "CKMB", "CKMBINDEX", "TROPONINI" in the last 168 hours.  HbA1C: Hemoglobin A1C  Date/Time Value Ref Range Status  12/30/2021 12:00 AM 13.0  Final   Hgb A1c MFr Bld  Date/Time Value Ref Range Status  02/17/2024 04:38 AM 5.6 4.8 - 5.6 % Final    Comment:    (NOTE) Pre diabetes:          5.7%-6.4%  Diabetes:              >6.4%  Glycemic control for   <7.0% adults with diabetes   08/02/2023 02:35 PM 9.0 (H) 4.8 - 5.6 % Final    Comment:    (NOTE)         Prediabetes: 5.7 - 6.4         Diabetes: >6.4         Glycemic control for adults with diabetes: <7.0     CBG: Recent Labs  Lab 04/18/24 1120 04/18/24 1617 04/18/24 2137  04/19/24 0745 04/19/24 1141  GLUCAP 162* 196* 245* 198* 265*    Review of Systems:   Review of Systems  Constitutional:  Positive for chills, fever and malaise/fatigue.  HENT:  Positive for congestion.   Respiratory:  Positive for shortness of breath. Negative for cough, hemoptysis, sputum production and wheezing.   Cardiovascular:  Positive for chest pain and leg swelling.  Gastrointestinal:  Positive for abdominal pain, diarrhea, nausea and vomiting.  Neurological:  Positive for focal weakness and headaches.     Past Medical History:  She,  has a past medical history of Anemia, Blindness of right eye with low vision in contralateral eye, Chronic diastolic heart failure (HCC) (16/08/9603), Diabetes mellitus, type II (HCC), Dyslipidemia, ESRD on hemodialysis (HCC), Glaucoma, History of blood transfusion, Hypertension, Hypothyroidism (acquired), Mitral regurgitation (11/16/2022), Pneumonia, and Pulmonary hypertension, unspecified (HCC) (09/12/2022).   Surgical History:   Past Surgical History:  Procedure Laterality Date   ABDOMINAL AORTOGRAM W/LOWER EXTREMITY Bilateral 12/18/2020   Procedure: ABDOMINAL AORTOGRAM W/LOWER EXTREMITY;  Surgeon: Richrd Char, MD;  Location: [facility];  Service: Cardiovascular;  Laterality: Bilateral;   ABDOMINAL AORTOGRAM W/LOWER EXTREMITY Bilateral 01/25/2022   Procedure: ABDOMINAL AORTOGRAM W/LOWER EXTREMITY;  Surgeon: Margherita Shell, MD;  Location: [facility];  Service: Cardiovascular;  Laterality: Bilateral;   ABDOMINAL AORTOGRAM W/LOWER EXTREMITY Right 08/04/2023   Procedure: ABDOMINAL AORTOGRAM W/LOWER EXTREMITY;  Surgeon: Carlene Che, MD;  Location: [facility];  Service: Cardiovascular;  Laterality: Right;   AMPUTATION Right 02/16/2022   Procedure: RIGHT FOOT 5TH RAY AMPUTATION;  Surgeon: Timothy Ford, MD;  Location[facility] OR;  Service: Orthopedics;  Laterality: Right;   ANKLE FRACTURE SURGERY Right    AV FISTULA  PLACEMENT Left 08/18/2020   Procedure: LEFT ARM BRACHIOCEPHALIC ARTERIOVENOUS (AV) FISTULA CREATION;  Surgeon: Richrd Char, MD;  Location: [facility] OR;  Service: Vascular;  Laterality: Left;   BIOPSY  04/24/2021   Procedure: BIOPSY;  Surgeon: Vinetta Greening, DO;  Location: [facility];  Service: Endoscopy;;   CESAREAN SECTION     CHOLECYSTECTOMY     COLONOSCOPY  04/24/2021   Surgeon: Vinetta Greening, DO;  nonbleeding internal hemorrhoids, 1 large (25 mm) pedunculated transverse colon polyp (prolapse type polyp) with adherent clot and stigmata of recent bleed.   COLONOSCOPY WITH PROPOFOL  N/A 05/14/2021   Procedure: COLONOSCOPY WITH PROPOFOL ;  Surgeon: Suzette Espy, MD;  Location: [facility];  Service: Endoscopy;  Laterality: N/A;   COLONOSCOPY WITH PROPOFOL  N/A 03/17/2023   Procedure: COLONOSCOPY WITH PROPOFOL ;  Surgeon: Suzette Espy, MD;  Location: [facility];  Service: Endoscopy;  Laterality: N/A;   ESOPHAGOGASTRODUODENOSCOPY (EGD) WITH PROPOFOL  N/A 04/24/2021   Surgeon: Vinetta Greening, DO;  duodenal erosions and gastritis biopsied (pathology with peptic duodenitis, reactive gastropathy with erosions/chronic inflammation, negative for H. pylori)   EYE SURGERY     Vatrectomy   HEMOSTASIS CLIP PLACEMENT  05/14/2021   Procedure: HEMOSTASIS CLIP PLACEMENT;  Surgeon: Suzette Espy, MD;  [facility]: [facility];  Service: Endoscopy;;   IR PERC TUN PERIT CATH WO PORT S&I /IMAG  09/15/2020   IR REMOVAL TUN CV CATH W/O FL  02/19/2021   IR US  GUIDE VASC ACCESS RIGHT  09/15/2020   POLYPECTOMY  04/24/2021   Procedure: POLYPECTOMY;  Surgeon: Vinetta Greening, DO;  [facility];  Service: Endoscopy;;   POLYPECTOMY  05/14/2021   Procedure: POLYPECTOMY;  Surgeon: Suzette Espy, MD;  [facility];  Service: Endoscopy;;   POLYPECTOMY  03/17/2023   Procedure: POLYPECTOMY;  Surgeon: Suzette Espy, MD;  [facility];  Service: Endoscopy;;   SKIN  SPLIT GRAFT Bilateral 09/03/2021   Procedure: SKIN GRAFT BILATERAL LEGS;  Surgeon: Timothy Ford, MD;  Location: [name] OR;  Service: Orthopedics;  Laterality: Bilateral;   SKIN SPLIT GRAFT  Left 12/10/2021   Procedure: IRRIGATION AND DEBRIDEMENT LEFT CALF, APPLICATION SPLIT THICKNESS SKIN GRAFT;  Surgeon: Timothy Ford, MD;  Location: [facility] OR;  Service: Orthopedics;  Laterality: Left;   TOE SURGERY       Social History:   reports that she has never smoked. She has never used smokeless tobacco. She reports that she does not drink alcohol and does not use drugs.   Family History:  Her family history includes Diabetes in her brother, father, and mother; Heart disease in her father and mother; Heart failure in her father, maternal grandfather, maternal grandmother, and mother; Kidney disease in her mother; Transient ischemic attack in her maternal grandfather. There is no history of Colon cancer.   Allergies Allergies  Allergen Reactions   Ace Inhibitors Cough     Home Medications  Prior to Admission medications   Medication Sig Start Date End Date Taking? Authorizing Provider  acetaminophen  (TYLENOL ) 325 MG tablet Take 2 tablets (650 mg total) by mouth every 6 (six) hours as needed for mild pain (pain score 1-3) (or Fever >/= 101). 03/04/24   Doroteo Gasmen, MD  acidophilus (RISAQUAD) CAPS capsule Take 2 capsules by mouth 2 (two) times daily. 04/19/24   Colin Dawley, MD  ascorbic acid  (VITAMIN C ) 500 MG tablet Take 1 tablet (500 mg total) by mouth daily. 04/11/2024   Colin Dawley, MD  atorvastatin  (LIPITOR) 10 MG tablet Take 10 mg by mouth every evening.    [provider]  AURYXIA  1 GM 210 MG(Fe) tablet Take 630 mg by mouth 3 (three) times daily with meals. 01/07/22   [provider]  HUMALOG  KWIKPEN 100 UNIT/ML KwikPen Inject 5-11 Units into the skin 3 (three) times daily with meals. If eats 50% or more of meal. Patient taking differently: Inject 5-11 Units into the  skin 3 (three) times daily with meals. If eats 50% or more of meal. Sliding scale 03/16/22   Nida, Gebreselassie W, MD  insulin  glargine (LANTUS ) 100 UNIT/ML injection Inject 0.08 mLs (8 Units total) into the skin at bedtime. 01/19/24   Ghimire, Estil Heman, MD  leptospermum manuka honey (MEDIHONEY) PSTE paste Apply 1 Application topically daily. Cleanse R ischial wound with Vashe wound cleanser, do not rinse and allow to air dry.  Apply Medihoney to wound bed daily, cover with dry gauze and silicone foam or ABD pad whichever is preferred Interchangeable with TheraHoney Apply thin layer (3 mm) to wound. 04/19/24   Colin Dawley, MD  levothyroxine  (SYNTHROID ) 75 MCG tablet Take 75 mcg by mouth daily. 06/13/22   [provider]  midodrine  (PROAMATINE ) 5 MG tablet Take 3 tablets (15 mg total) by mouth 3 (three) times daily. 04/19/24   Colin Dawley, MD  vancomycin  (VANCOCIN ) 125 MG capsule Take 1 capsule (125 mg total) by mouth See admin instructions. 2-week oral Vanco taper (with vancomycin  125 mg p.o. 4 times daily for 14 days, then vancomycin  125 mg twice daily for 7 days, then vancomycin  125 mg daily for 7 days, then vancomycin  125 mg every other day for 28 days and then vancomycin  every 3 days for 28 days. 04/19/24   Colin Dawley, MD  Vitamin D , Ergocalciferol , (DRISDOL ) 1.25 MG (50000 UNIT) CAPS capsule Take 50,000 Units by mouth every 7 (seven) days. Friday 04/20/21   [provider]  zinc  sulfate, 50mg  elemental zinc , 220 (50 Zn) MG capsule Take 1 capsule (220 mg total) by mouth daily. 04/17/2024   Colin Dawley, MD     Critical  care time: 60 minutes    Duaine German, MD  Pulmonary & Critical Care Office: [phone]   See Amion for personal pager PCCM on call pager [phone] until 7pm. Please call Elink 7p-7a. [phone]

## 2024-04-21 NOTE — Code Documentation (Signed)
 Pt c.o feeling nauseated, then suddenly tensed up, eyes rolled to the back of her head, moaning, unresponsive. EDP called to bedside, pt then lost pulses. CPR started at 1751

## 2024-04-21 NOTE — Progress Notes (Signed)
 Progress Note Patient with no arterial line pulse and in PEA.  Case d/w daughter, many family at bedside.  Plan to de-escalate and make her comfort measures. Patient pronounced deceased 05-13-2317 PEA Cardiac Arrest. Amy Cumming, MD 04/07/2024  [phone]

## 2024-04-21 NOTE — ED Provider Notes (Signed)
 [place] EMERGENCY DEPARTMENT AT [facility] Provider Note   CSN: [id number] Arrival date & time: 04/02/2024  1256     History  Chief Complaint  Patient presents with   Weakness    Amy Moses is a 53 y.o. female.  Patient complains of weakness and low blood pressure.  Patient has a past medical history of renal failure.  She is on dialysis.  Patient reports that she was discharged from the hospital at [name] yesterday.  Patient states her blood pressure is low again today.  Patient is taking midodrine  15 mg 3 times a day.  Patient states that blood pressure was in the 70/30 range at home.  Patient had dialysis while in the hospital.  Patient was diagnosed with C. difficile and is currently on vancomycin .  Patient has a past medical history of C. difficile.  Patient reports that she is having multiple episodes of diarrhea.  Patient is not eating or drinking.  Patient's family reports patient has multiple wounds currently.  Patient is followed by Dr. Julio Ohm for a wound on her heel.  Patient has known decubitus ulcers.  Family concerned that patient may have opened wounds from scraping on the wheelchair.  Patient does have bleeding through her pants.  She reports last taking her midodrine  at 6 AM.  Patient has a past medical history of renal failure anemia insulin -dependent diabetes chronic diastolic heart failure, sacral ulcer, heel wound, anemia and C. difficile.  Family reports patient has been losing weight.  The history is provided by the patient and a relative.  Weakness Onset quality:  Gradual Timing:  Constant Progression:  Worsening Relieved by:  Nothing      Home Medications Prior to Admission medications   Medication Sig Start Date End Date Taking? Authorizing Provider  acetaminophen  (TYLENOL ) 325 MG tablet Take 2 tablets (650 mg total) by mouth every 6 (six) hours as needed for mild pain (pain score 1-3) (or Fever >/= 101). 03/04/24   Doroteo Gasmen, MD   acidophilus (RISAQUAD) CAPS capsule Take 2 capsules by mouth 2 (two) times daily. 04/19/24   Colin Dawley, MD  ascorbic acid  (VITAMIN C ) 500 MG tablet Take 1 tablet (500 mg total) by mouth daily. 04/08/2024   Colin Dawley, MD  atorvastatin  (LIPITOR) 10 MG tablet Take 10 mg by mouth every evening.    [provider]  AURYXIA  1 GM 210 MG(Fe) tablet Take 630 mg by mouth 3 (three) times daily with meals. 01/07/22   [provider]  HUMALOG  KWIKPEN 100 UNIT/ML KwikPen Inject 5-11 Units into the skin 3 (three) times daily with meals. If eats 50% or more of meal. Patient taking differently: Inject 5-11 Units into the skin 3 (three) times daily with meals. If eats 50% or more of meal. Sliding scale 03/16/22   Nida, Gebreselassie W, MD  insulin  glargine (LANTUS ) 100 UNIT/ML injection Inject 0.08 mLs (8 Units total) into the skin at bedtime. 01/19/24   Ghimire, Estil Heman, MD  leptospermum manuka honey (MEDIHONEY) PSTE paste Apply 1 Application topically daily. Cleanse R ischial wound with Vashe wound cleanser, do not rinse and allow to air dry.  Apply Medihoney to wound bed daily, cover with dry gauze and silicone foam or ABD pad whichever is preferred Interchangeable with TheraHoney Apply thin layer (3 mm) to wound. 04/19/24   Colin Dawley, MD  levothyroxine  (SYNTHROID ) 75 MCG tablet Take 75 mcg by mouth daily. 06/13/22   [provider]  midodrine  (PROAMATINE ) 5 MG  tablet Take 3 tablets (15 mg total) by mouth 3 (three) times daily. 04/19/24   Colin Dawley, MD  vancomycin  (VANCOCIN ) 125 MG capsule Take 1 capsule (125 mg total) by mouth See admin instructions. 2-week oral Vanco taper (with vancomycin  125 mg p.o. 4 times daily for 14 days, then vancomycin  125 mg twice daily for 7 days, then vancomycin  125 mg daily for 7 days, then vancomycin  125 mg every other day for 28 days and then vancomycin  every 3 days for 28 days. 04/19/24   Colin Dawley, MD  Vitamin D , Ergocalciferol ,  (DRISDOL ) 1.25 MG (50000 UNIT) CAPS capsule Take 50,000 Units by mouth every 7 (seven) days. Friday 04/20/21   [provider]  zinc  sulfate, 50mg  elemental zinc , 220 (50 Zn) MG capsule Take 1 capsule (220 mg total) by mouth daily. 04/12/2024   Colin Dawley, MD      Allergies    Ace inhibitors    Review of Systems   Review of Systems  Neurological:  Positive for weakness.  All other systems reviewed and are negative.   Physical Exam Updated Vital Signs BP (!) 79/48   Pulse 64   Temp 97.6 F (36.4 C)   Resp (!) 25   LMP 08/22/2015 (Approximate)   SpO2 96%  Physical Exam Vitals and nursing note reviewed.  Constitutional:      Appearance: She is well-developed.  HENT:     Head: Normocephalic.     Mouth/Throat:     Mouth: Mucous membranes are moist.  Cardiovascular:     Rate and Rhythm: Normal rate.  Pulmonary:     Effort: Pulmonary effort is normal.  Abdominal:     General: Abdomen is flat. There is no distension.     Palpations: Abdomen is soft.  Genitourinary:    Comments: Bleeding wounds inner thigh and sacrum, Musculoskeletal:     Cervical back: Normal range of motion.     Comments: Leg wraps bilateral lower extremities  Skin:    General: Skin is warm.  Neurological:     General: No focal deficit present.     Mental Status: She is alert and oriented to person, place, and time.  Psychiatric:        Mood and Affect: Mood normal.     ED Results / Procedures / Treatments   Labs (all labs ordered are listed, but only abnormal results are displayed) Labs Reviewed  CBC WITH DIFFERENTIAL/PLATELET  COMPREHENSIVE METABOLIC PANEL WITH GFR    EKG None  Radiology No results found.  Procedures Procedures    Medications Ordered in ED Medications  midodrine  (PROAMATINE ) tablet 15 mg (has no administration in time range)    ED Course/ Medical Decision Making/ A&P Clinical Course as of 03/31/2024 1525  Sat Apr 20, 2024  1504 D /c yesterday from [facility]  for hypotension. On midodrine . Recent dx of cdiff. On PO vanc. Was having diarrhea and felt weak today so BIB family. Last dialysis was during admission at [facility]. Due for dialysis. Has a decubitus ulcer. Likely admission.  [JR]    Clinical Course User Index [JR] Janalee Mcmurray, PA-C                                 Medical Decision Making Patient complains of diarrhea.  Patient is not eating or drinking.  Patient reports her blood pressure is low today.  Amount and/or Complexity of Data Reviewed Labs: ordered.  Details: Labs ordered and reviewed. Radiology: ordered. Discussion of management or test interpretation with external provider(s): Pt's care turned over to John Robinson PAC  Risk Prescription drug management. Risk Details: Patient is given 15 mg of midodrine . IV fluids ordered.             Final Clinical Impression(s) / ED Diagnoses Final diagnoses:  Hypotension, unspecified hypotension type  C. difficile diarrhea  Acute renal failure, unspecified acute renal failure type [facility])    Rx / DC Orders ED Discharge Orders     None         Tauheedah Bok K, PA-C 04/12/2024 1526    Mozell Arias, MD 04/22/24 1421

## 2024-04-21 NOTE — Progress Notes (Addendum)
   04/02/2024 2006  Spiritual Encounters  Type of Visit Initial  Care provided to: Family  Reason for visit Code  OnCall Visit Yes   Chaplain responded to Code Blue. Provided spiritual care to four family members of Patient. Patient was in ER for a long time and had several Code Blues. Chaplain guided family to family consult room in 6M. Pt now in 6M05. Family is deeply religious and are praying and coping reasonably well. Chaplain will update overnight Chaplain about this family's ongoing need.  Chaplain Alphonzo Ask

## 2024-04-21 NOTE — ED Notes (Addendum)
 Wrong pt

## 2024-04-21 NOTE — ED Notes (Signed)
 CCMD called.

## 2024-04-21 NOTE — Sepsis Progress Note (Signed)
 Sepsis protocol monitored by eLink ?

## 2024-04-21 NOTE — ED Notes (Signed)
 Pt moaning, unresponsive to painful stimuli, grimacing, weak femoral pulse

## 2024-04-21 NOTE — Consult Note (Incomplete)
 Cardiology Consultation:   Patient ID: Amy Moses MRN: [id number]; DOB: 1971-06-23  Admit date: 03/30/2024 Date of Consult: 03/27/2024  Primary Care Provider: Dorrine Gaudy, DO [facility] Cardiologist: Maudine Sos, MD  [facility] Electrophysiologist:  None   Patient Profile:   Amy Moses is a 53 y.o. female with a hx of *** who is being seen today for the evaluation of *** at the request of ***.  History of Present Illness:   Amy Moses ***  Past Medical History:  Diagnosis Date   Anemia    Blindness of right eye with low vision in contralateral eye    s/p victrectomy   Chronic diastolic heart failure (HCC) 03/30/2021   Diabetes mellitus, type II (HCC)    Dyslipidemia    ESRD on hemodialysis (HCC)    TTS in [place] , [place]   Glaucoma    History of blood transfusion    Hypertension    Hypothyroidism (acquired)    Mitral regurgitation 11/16/2022   Pneumonia    Pulmonary hypertension, unspecified (HCC) 09/12/2022    Past Surgical History:  Procedure Laterality Date   ABDOMINAL AORTOGRAM W/LOWER EXTREMITY Bilateral 12/18/2020   Procedure: ABDOMINAL AORTOGRAM W/LOWER EXTREMITY;  Surgeon: Richrd Char, MD;  Location: [facility] INVASIVE CV LAB;  Service: Cardiovascular;  Laterality: Bilateral;   ABDOMINAL AORTOGRAM W/LOWER EXTREMITY Bilateral 01/25/2022   Procedure: ABDOMINAL AORTOGRAM W/LOWER EXTREMITY;  Surgeon: Margherita Shell, MD;  Location: [facility] INVASIVE CV LAB;  Service: Cardiovascular;  Laterality: Bilateral;   ABDOMINAL AORTOGRAM W/LOWER EXTREMITY Right 08/04/2023   Procedure: ABDOMINAL AORTOGRAM W/LOWER EXTREMITY;  Surgeon: Carlene Che, MD;  Location: [facility] INVASIVE CV LAB;  Service: Cardiovascular;  Laterality: Right;   AMPUTATION Right 02/16/2022   Procedure: RIGHT FOOT 5TH RAY AMPUTATION;  Surgeon: Timothy Ford, MD;  Location: [facility] OR;  Service: Orthopedics;  Laterality: Right;   ANKLE FRACTURE SURGERY Right    AV FISTULA PLACEMENT Left  08/18/2020   Procedure: LEFT ARM BRACHIOCEPHALIC ARTERIOVENOUS (AV) FISTULA CREATION;  Surgeon: Richrd Char, MD;  Location: [facility] OR;  Service: Vascular;  Laterality: Left;   BIOPSY  04/24/2021   Procedure: BIOPSY;  Surgeon: Vinetta Greening, DO;  Location: [facility] ENDO SUITE;  Service: Endoscopy;;   CESAREAN SECTION     CHOLECYSTECTOMY     COLONOSCOPY  04/24/2021   Surgeon: Vinetta Greening, DO;  nonbleeding internal hemorrhoids, 1 large (25 mm) pedunculated transverse colon polyp (prolapse type polyp) with adherent clot and stigmata of recent bleed.   COLONOSCOPY WITH PROPOFOL  N/A 05/14/2021   Procedure: COLONOSCOPY WITH PROPOFOL ;  Surgeon: Suzette Espy, MD;  Location: [facility]O [facility];  Service: Endoscopy;  Laterality: N/A;   COLONOSCOPY WITH PROPOFOL  N/A 03/17/2023   Procedure: COLONOSCOPY WITH PROPOFOL ;  Surgeon: Suzette Espy, MD;  Location: [facility];  Service: Endoscopy;  Laterality: N/A;   ESOPHAGOGASTRODUODENOSCOPY (EGD) WITH PROPOFOL  N/A 04/24/2021   Surgeon: Vinetta Greening, DO;  duodenal erosions and gastritis biopsied (pathology with peptic duodenitis, reactive gastropathy with erosions/chronic inflammation, negative for H. pylori)   EYE SURGERY     Vatrectomy   HEMOSTASIS CLIP PLACEMENT  05/14/2021   Procedure: HEMOSTASIS CLIP PLACEMENT;  Surgeon: Suzette Espy, MD;  Location: [facility];  Service: Endoscopy;;   IR PERC TUN PERIT CATH WO PORT S&I /IMAG  09/15/2020   IR REMOVAL TUN CV CATH W/O FL  02/19/2021   IR US  GUIDE VASC ACCESS RIGHT  09/15/2020   POLYPECTOMY  04/24/2021   Procedure:  POLYPECTOMY;  Surgeon: Vinetta Greening, DO;  Location: [facility];  Service: Endoscopy;;   POLYPECTOMY  05/14/2021   Procedure: POLYPECTOMY;  Surgeon: Suzette Espy, MD;  Location: [facility];  Service: Endoscopy;;   POLYPECTOMY  03/17/2023   Procedure: POLYPECTOMY;  Surgeon: Suzette Espy, MD;  Location: [facility];  Service: Endoscopy;;   SKIN SPLIT GRAFT  Bilateral 09/03/2021   Procedure: SKIN GRAFT BILATERAL LEGS;  Surgeon: Timothy Ford, MD;  Location: [facility] OR;  Service: Orthopedics;  Laterality: Bilateral;   SKIN SPLIT GRAFT Left 12/10/2021   Procedure: IRRIGATION AND DEBRIDEMENT LEFT CALF, APPLICATION SPLIT THICKNESS SKIN GRAFT;  Surgeon: Timothy Ford, MD;  Location: [facility] OR;  Service: Orthopedics;  Laterality: Left;   TOE SURGERY       {Home Medications (Optional):21181}  Inpatient Medications: Scheduled Meds:  heparin   5,000 Units Subcutaneous Q8H   midodrine   15 mg Oral TID WC   vancomycin   125 mg Oral QID   Continuous Infusions:  lactated ringers  150 mL/hr at 04/11/2024 1552   norepinephrine (LEVOPHED) Adult infusion 15 mcg/min (04/18/2024 1806)   vancomycin  1,500 mg (04/19/2024 1748)   PRN Meds: docusate sodium , EPINEPHrine, EPINEPHrine, iohexol , polyethylene glycol  Allergies:    Allergies  Allergen Reactions   Ace Inhibitors Cough    Social History:   Social History   Socioeconomic History   Marital status: Single    Spouse name: Not on file   Number of children: Not on file   Years of education: Not on file   Highest education level: Not on file  Occupational History   Not on file  Tobacco Use   Smoking status: Never   Smokeless tobacco: Never  Vaping Use   Vaping status: Never Used  Substance and Sexual Activity   Alcohol use: No   Drug use: No   Sexual activity: Yes    Birth control/protection: Condom  Other Topics Concern   Not on file  Social History Narrative   Not on file   Social Drivers of Health   Financial Resource Strain: Not on file  Food Insecurity: No Food Insecurity (04/17/2024)   Hunger Vital Sign    Worried About Running Out of Food in the Last Year: Never true    Ran Out of Food in the Last Year: Never true  Transportation Needs: No Transportation Needs (04/17/2024)   PRAPARE - Administrator, Civil Service (Medical): No    Lack of Transportation (Non-Medical): No   Physical Activity: Not on file  Stress: Not on file  Social Connections: Not on file  Intimate Partner Violence: Not At Risk (04/17/2024)   Humiliation, Afraid, Rape, and Kick questionnaire    Fear of Current or Ex-Partner: No    Emotionally Abused: No    Physically Abused: No    Sexually Abused: No    Family History:   *** Family History  Problem Relation Age of Onset   Heart failure Mother    Heart disease Mother    Diabetes Mother    Kidney disease Mother    Heart failure Father    Diabetes Father    Heart disease Father    Diabetes Brother    Heart failure Maternal Grandmother    Heart failure Maternal Grandfather    Transient ischemic attack Maternal Grandfather    Colon cancer Neg Hx      ROS:  Review of Systems: [y] = yes, [ ]  = no  General: Weight gain [ ] ; Weight loss [ ] ; Anorexia [ ] ; Fatigue [ ] ; Fever [ ] ; Chills [ ] ; Weakness [ ]    Cardiac: Chest pain/pressure [ ] ; Resting SOB [ ] ; Exertional SOB [ ] ; Orthopnea [ ] ; Pedal Edema [ ] ; Palpitations [ ] ; Syncope [ ] ; Presyncope [ ] ; Paroxysmal nocturnal dyspnea [ ]    Pulmonary: Cough [ ] ; Wheezing [ ] ; Hemoptysis [ ] ; Sputum [ ] ; Snoring [ ]    GI: Vomiting [ ] ; Dysphagia [ ] ; Melena [ ] ; Hematochezia [ ] ; Heartburn [ ] ; Abdominal pain [ ] ; Constipation [ ] ; Diarrhea [ ] ; BRBPR [ ]    GU: Hematuria [ ] ; Dysuria [ ] ; Nocturia [ ]  Vascular: Pain in legs with walking [ ] ; Pain in feet with lying flat [ ] ; Non-healing sores [ ] ; Stroke [ ] ; TIA [ ] ; Slurred speech [ ] ;   Neuro: Headaches [ ] ; Vertigo [ ] ; Seizures [ ] ; Paresthesias [ ] ;Blurred vision [ ] ; Diplopia [ ] ; Vision changes [ ]    Ortho/Skin: Arthritis [ ] ; Joint pain [ ] ; Muscle pain [ ] ; Joint swelling [ ] ; Back Pain [ ] ; Rash [ ]    Psych: Depression [ ] ; Anxiety [ ]    Heme: Bleeding problems [ ] ; Clotting disorders [ ] ; Anemia [ ]    Endocrine: Diabetes [ ] ; Thyroid dysfunction [ ]    Physical Exam/Data:   Vitals:   04/02/2024 1802 03/29/2024 1806 03/26/2024  1808 03/31/2024 1810  BP: (!) 130/95 (!) 97/49 (!) 158/112   Pulse:      Resp: (!) 27 (!) 47 (!) 22 (!) 24  Temp:      TempSrc:      SpO2:        Intake/Output Summary (Last 24 hours) at 04/18/2024 1813 Last data filed at 04/18/2024 1739 Gross per 24 hour  Intake 1100 ml  Output --  Net 1100 ml      04/18/2024    3:00 PM 04/18/2024   11:15 AM 04/18/2024    4:00 AM  Last 3 Weights  Weight (lbs) 173 lb 8 oz 175 lb 14.8 oz 175 lb 7.8 oz  Weight (kg) 78.7 kg 79.8 kg 79.6 kg     There is no height or weight on file to calculate BMI.  General:  Well nourished, well developed, in no acute distress*** HEENT: normal Lymph: no adenopathy Neck: no JVD Endocrine:  No thryomegaly Vascular: No carotid bruits; FA pulses 2+ bilaterally without bruits  Cardiac:  normal S1, S2; RRR; no murmur *** Lungs:  clear to auscultation bilaterally, no wheezing, rhonchi or rales  Abd: soft, nontender, no hepatomegaly  Ext: no edema Musculoskeletal:  No deformities, BUE and BLE strength normal and equal Skin: warm and dry  Neuro:  CNs 2-12 intact, no focal abnormalities noted Psych:  Normal affect   EKG:  The EKG was personally reviewed and demonstrates:  *** Telemetry:  Telemetry was personally reviewed and demonstrates:  ***  Relevant CV Studies: ***  Laboratory Data:  High Sensitivity Troponin:  No results for input(s): "TROPONINIHS" in the last 720 hours.   Chemistry Recent Labs  Lab 04/17/24 1635 04/17/24 1754 04/18/24 0425 04/12/2024 1328  NA 128* 128* 129* 130*  K 4.8 4.6 5.0 4.6  CL 89* 95* 93* 92*  CO2 20*  --  21* 20*  GLUCOSE 89 94 124* 74  BUN 54* 47* 60* 52*  CREATININE 9.03* 9.40* 8.84* 7.48*  CALCIUM  9.1  --  8.6* 9.1  GFRNONAA  5*  --  5* 6*  ANIONGAP 19*  --  15 18*    Recent Labs  Lab 04/17/24 1635 04/18/24 0425 04/08/2024 1328  PROT 6.7 5.6* 5.4*  ALBUMIN  3.1* 2.5* 2.3*  AST 10* 10* 19  ALT 8 8 9   ALKPHOS 98 95 87  BILITOT 1.5* 0.9 1.6*   Hematology Recent  Labs  Lab 04/18/24 0425 04/19/24 0833 04/02/2024 1328  WBC 19.3* 16.2* 30.5*  RBC 3.38* 3.27* 3.51*  HGB 9.5* 9.0* 9.9*  HCT 29.7* 29.0* 30.2*  MCV 87.9 88.7 86.0  MCH 28.1 27.5 28.2  MCHC 32.0 31.0 32.8  RDW 13.7 14.0 14.2  PLT 128* 114* 161   BNPNo results for input(s): "BNP", "PROBNP" in the last 168 hours.  DDimer No results for input(s): "DDIMER" in the last 168 hours.  Radiology/Studies:  DG Chest Port 1 View Result Date: 04/01/2024 CLINICAL DATA:  Questionable sepsis - evaluate for abnormality EXAM: PORTABLE CHEST - 1 VIEW COMPARISON:  Apr 17, 2024 FINDINGS: Elevation of the right hemidiaphragm. No focal airspace consolidation, pleural effusion, or pneumothorax. Unchanged moderate cardiomegaly. Aortic atherosclerosis. No acute fracture or destructive lesion. Multilevel thoracic osteophytosis. IMPRESSION: Moderate cardiomegaly, unchanged. Otherwise, no acute cardiopulmonary abnormality. Electronically Signed   By: Rance Burrows M.D.   On: 04/16/2024 16:28   CT ABDOMEN PELVIS WO CONTRAST Result Date: 04/17/2024 CLINICAL DATA:  Abdominal pain, acute, nonlocalized 78F when her home health nurse checked on her today, and Pt reports increasing weakness since Monday. EXAM: CT ABDOMEN AND PELVIS WITHOUT CONTRAST TECHNIQUE: Multidetector CT imaging of the abdomen and pelvis was performed following the standard protocol without IV contrast. RADIATION DOSE REDUCTION: This exam was performed according to the departmental dose-optimization program which includes automated exposure control, adjustment of the mA and/or kV according to patient size and/or use of iterative reconstruction technique. COMPARISON:  CT abdomen pelvis 02/16/2024. FINDINGS: Lower chest: At least small volume pericardial effusion. Cardiac changes suggestive of anemia. Coronary artery calcification. Hepatobiliary: No focal liver abnormality. Status post cholecystectomy. No biliary dilatation. Pancreas: No focal lesion. Normal  pancreatic contour. No surrounding inflammatory changes. No main pancreatic ductal dilatation. Spleen: Normal in size without focal abnormality. Adrenals/Urinary Tract: No adrenal nodule bilaterally. Bilateral renal scarring. No nephrolithiasis and no hydronephrosis. No definite contour-deforming renal mass. No ureterolithiasis or hydroureter. The urinary bladder is decompressed. Stomach/Bowel: Stomach is within normal limits. No evidence of bowel wall thickening or dilatation. Appendix appears normal. Vascular/Lymphatic: No abdominal aorta or iliac aneurysm. Extensive atherosclerotic plaque of the aorta and its branches. No abdominal, pelvic, or inguinal lymphadenopathy. Reproductive: Lobulated uterine contour with calcified and enhancing fundal mass suggestive of a degenerative uterine fibroid. Otherwise uterus and bilateral adnexa are unremarkable. Other: No intraperitoneal free fluid. No intraperitoneal free gas. No organized fluid collection. Musculoskeletal: No abdominal wall hernia or abnormality. Calcified granulomas of the gluteal soft tissues. No suspicious lytic or blastic osseous lesions. No acute displaced fracture. Multilevel degenerative changes of the spine. IMPRESSION: 1. No acute intra-abdominal or intrapelvic abnormality with limited evaluation on this noncontrast study. 2. Uterine fibroids. 3.  Aortic Atherosclerosis (ICD10-I70.0)-extensive. Electronically Signed   By: Morgane  Naveau M.D.   On: 04/17/2024 18:50   DG Chest Port 1 View Result Date: 04/17/2024 CLINICAL DATA:  Questionable sepsis - evaluate for abnormality EXAM: PORTABLE CHEST - 1 VIEW COMPARISON:  June 24, 2023 FINDINGS: No focal airspace consolidation, pleural effusion, or pneumothorax. Unchanged moderate cardiomegaly. Aortic atherosclerosis. No acute fracture or destructive lesion. IMPRESSION: Unchanged moderate cardiomegaly. Otherwise,  no pneumonia or pulmonary edema. Electronically Signed   By: Rance Burrows M.D.   On:  04/17/2024 18:21   { If the patient is being seen for chest pain, [place] , NSTEMI or STEMI Press F2 to calculate a risk score         :[id number]}   {Chest Pain/ACS Risk Score        :4098119147}   Assessment and Plan:   ***  {Are we signing off today?:[id number]}  For questions or updates, please contact [facility] Please consult [email or web address] for contact info under    Signed, Katrine Parody, MD  04/16/2024 6:13 PM

## 2024-04-21 NOTE — ED Notes (Signed)
CPR started again

## 2024-04-21 NOTE — Progress Notes (Signed)
 Progress Note  Patient seen/examined and received from ED to 2M05.  Patient on vent at 100% FIO2, 40mcg Levophed and 40 mcg Epinephrine, 0.03 units Vasopressin.  She has no cough/gag/corneals not breathing over the vent.  Right femoral TLC and A line.   Bedside POCUS showing no tamponade and no large pericardial effusion  Family (daughter POA) updated at bedside. They want to continue with full code/full measures. A: D/c yesterday from [facility], C diff colitis, returned today for low BPs and then had several PEA arrests. Now intubated on multiple pressors  Plan to continue with vent support BP/Pressors support CT head when more stable F/u CT abd/pelvis Lactic Acid now resulting high CRRT consideration if she remains stable in the next couple hours. Bicarb drip started- PH 6.9 1amp D50 pushed  The patient is critically ill with multiple organ system failure and requires high complexity decision making for assessment and support, frequent evaluation and titration of therapies, advanced monitoring, review of radiographic studies and interpretation of complex data.   Critical Care Time devoted to patient care services, exclusive of separately billable procedures, described in this note is 45 minutes.   Claven Cumming, MD [facility] See Amion for pager  If no response to pager , please call [phone] until 7pm After 7:00 pm call Elink  [phone] 03/25/2024, 8:56 PM

## 2024-04-21 NOTE — Progress Notes (Signed)
 Progress Note  Patient went for CT scan and arrested in the hallway.  She received CPR and 1 Epinephrine and ROSC achieved.  Brought back to ICU.  Dialysis catheter place by PA Gleason. CT results noted and showing possible Necrotizing Fasciitis.  I called surgery Dr Hildy Lowers.  I also called Nephrology for CRRT.   However, about half hour later patient with significant hypotension, not responsive to Neosticks and 2 IVP Bicarb. SPB 45 per A line. Family in the room and the case d/w them. (Her ETT was pulled back to 22cm at the lip, based on CT scan)  At this point patient is DNR. No surgical intervention and she is not a candidate for CRRT. No escalation. Will try to d/w family for comfort measures  CC time extra 40 min

## 2024-04-21 NOTE — Code Documentation (Signed)
 Pulse check: positive femoral pulse with doppler, pt started to wake up and moaning, c.o continued nausea.

## 2024-04-21 NOTE — ED Notes (Signed)
 Unable to obtain 2nd set of cultures

## 2024-04-21 NOTE — ED Triage Notes (Signed)
 Pt was discharged yesterday from hospital admission but is still having generalized weakness, hypotension, headache and ringing in her ears. Pt takes midodrine  and was taking it as prescribed but pt still reports low BP at home. Pt was also diagnosed with c diff while in the hospital and is taking vancomycin .

## 2024-04-21 NOTE — Procedures (Signed)
 Central Venous Catheter Insertion Procedure Note  Amy Moses  [id number]  January 05, 1971  Date:03/24/2024  Time:10:37 PM   Provider Performing:Brenly Trawick R Loralyn Rachel   Procedure: Insertion of Non-tunneled Central Venous Catheter(36556)with US  guidance (08657)    Indication(s) Hemodialysis  Consent Risks of the procedure as well as the alternatives and risks of each were explained to the patient and/or caregiver.  Consent for the procedure was obtained and is signed in the bedside chart  Anesthesia Topical only with 1% lidocaine    Timeout Verified patient identification, verified procedure, site/side was marked, verified correct patient position, special equipment/implants available, medications/allergies/relevant history reviewed, required imaging and test results available.  Sterile Technique Maximal sterile technique including full sterile barrier drape, hand hygiene, sterile gown, sterile gloves, mask, hair covering, sterile ultrasound probe cover (if used).  Procedure Description Area of catheter insertion was cleaned with chlorhexidine  and draped in sterile fashion.   With real-time ultrasound guidance a HD catheter was placed into the right internal jugular vein.  Nonpulsatile blood flow and easy flushing noted in all ports.  The catheter was sutured in place and sterile dressing applied.  Complications/Tolerance None; patient tolerated the procedure well. Chest X-ray is ordered to verify placement for internal jugular or subclavian cannulation.  Chest x-ray is not ordered for femoral cannulation.  EBL Minimal  Specimen(s) None   [name] [name], PA-C

## 2024-04-21 NOTE — ED Provider Notes (Incomplete)
  Physical Exam  BP (!) 79/48   Pulse 64   Temp 97.6 F (36.4 C)   Resp (!) 25   LMP 08/22/2015 (Approximate)   SpO2 96%   Physical Exam  Procedures  .Critical Care  Performed by: Janalee Mcmurray, PA-C Authorized by: Janalee Mcmurray, PA-C   Critical care provider statement:    Critical care time (minutes):  120   Critical care was necessary to treat or prevent imminent or life-threatening deterioration of the following conditions:  Sepsis, shock, respiratory failure, circulatory failure and cardiac failure (cardiac arrest x2 with rosc, leading to intubation, requring multipe vasopressors to maintain BP)   Critical care was time spent personally by me on the following activities:  Development of treatment plan with patient or surrogate, discussions with consultants, evaluation of patient's response to treatment, examination of patient, ordering and review of laboratory studies, ordering and review of radiographic studies, ordering and performing treatments and interventions, pulse oximetry, re-evaluation of patient's condition and review of old charts   ED Course / MDM   Clinical Course as of 04/17/2024 1654  Sat Apr 20, 2024  1504 D /c yesterday from [facility] for hypotension. On midodrine . Recent dx of cdiff. On PO vanc. Was having diarrhea and felt weak today so BIB family. Last dialysis was during admission at [facility]. Due for dialysis. Has a decubitus ulcer. Likely admission.  [JR]  1651 BP(!): 86/50 [JR]    Clinical Course User Index [JR] Janalee Mcmurray, PA-C   Medical Decision Making Amount and/or Complexity of Data Reviewed Labs: ordered. Radiology: ordered.  Risk Prescription drug management. Decision regarding hospitalization.     ***

## 2024-04-21 NOTE — ED Notes (Signed)
Pt awake, moaning.

## 2024-04-21 DEATH — deceased

## 2024-04-22 ENCOUNTER — Ambulatory Visit: Admitting: Orthopedic Surgery

## 2024-04-22 LAB — CULTURE, BLOOD (ROUTINE X 2)
Culture: NO GROWTH
Culture: NO GROWTH
Special Requests: ADEQUATE

## 2024-04-25 LAB — CULTURE, BLOOD (ROUTINE X 2)
Culture: NO GROWTH
Culture: NO GROWTH
Special Requests: ADEQUATE

## 2024-05-03 NOTE — Discharge Summary (Signed)
 DEATH SUMMARY   Patient Details  Name: Amy Moses MRN: [id number] DOB: Mar 14, 1971 WUJ:[id number], Lavonia Powers, DO  Admission/Discharge Information   Admit Date:  04/30/2024  Date of Death: Date of Death: 30-Apr-2024  Time of Death: Time of Death: 22-May-2317  Length of Stay: 1   Principle Cause of death: Cardiac Arrest  Hospital Diagnoses: Principal Problem:   Shock (HCC) Active Problems:   Acute renal failure (HCC)   C. difficile colitis--Recurrent   Hospital Course: D/c yesterday from [facility], C diff colitis, returned today for low BPs and then had several PEA arrests. Now intubated on multiple pressors   Assessment and Plan: Patient with no arterial line pulse and in PEA.  Case d/w daughter, many family at bedside.  Plan to de-escalate and make her comfort measures. Patient pronounced deceased May 22, 2317 PEA Cardiac Arrest.        Procedures: CPR, Intubation, Central line  Consultations: n/a  The results of significant diagnostics from this hospitalization (including imaging, microbiology, ancillary and laboratory) are listed below for reference.   Significant Diagnostic Studies: CT Head Wo Contrast Result Date: 04-30-2024 CLINICAL DATA:  Mental status change, unknown cause. EXAM: CT HEAD WITHOUT CONTRAST TECHNIQUE: Contiguous axial images were obtained from the base of the skull through the vertex without intravenous contrast. RADIATION DOSE REDUCTION: This exam was performed according to the departmental dose-optimization program which includes automated exposure control, adjustment of the mA and/or kV according to patient size and/or use of iterative reconstruction technique. COMPARISON:  None Available. FINDINGS: Brain: There is mild cerebral atrophy and early small-vessel disease of the cerebral white matter. The cerebellum and brainstem are unremarkable. No cortical based acute infarct, hemorrhage, mass or mass effect are seen. The ventricles are normal in size and position. There are  dystrophic dural calcifications in the frontal falx. The basal cisterns are clear. Vascular: There is age advanced heavy calcification in the distal vertebral arteries and both siphons. No hyperdense central vessel is seen. Skull: Negative for fractures or focal lesions. There are numerous punctate subcutaneous calcifications in the scalp. The vascular calcifications in the scalp. Sinuses/Orbits: Clear sinuses and mastoids with midline nasal septum. There is partial visualization of an orotracheal intubation on the scout image with backed up fluid in the dorsal oronasopharynx. 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 Evidence of prior lens replacement on the left. Otherwise negative orbits. Other: None. IMPRESSION: 1. No acute intracranial CT findings. 2. Mild atrophy and early small-vessel disease. 3. Age advanced vascular calcifications. 4. Partial visualization of an orotracheal intubation on the scout image with backed up fluid in the dorsal oronasopharynx. Electronically Signed   By: Denman Fischer M.D.   On: 04/30/2024 21:53   CT Angio Chest/Abd/Pel for Dissection W and/or W/WO Result Date: 2024-04-30 CLINICAL DATA:  Acute aortic syndrome suspected. EXAM: CT ANGIOGRAPHY CHEST, ABDOMEN AND PELVIS TECHNIQUE: Non-contrast CT of the chest was initially obtained. Multidetector CT imaging through the chest, abdomen and pelvis was performed using the standard protocol during bolus administration of intravenous contrast. Multiplanar reconstructed images and MIPs were obtained and reviewed to evaluate the vascular anatomy. RADIATION DOSE REDUCTION: This exam was performed according to the departmental dose-optimization program which includes automated exposure control, adjustment of the mA and/or kV according to patient size and/or use of iterative reconstruction technique. CONTRAST:  75mL OMNIPAQUE  IOHEXOL  350 MG/ML SOLN COMPARISON:  CT abdomen and pelvis 04/17/2024 FINDINGS: CTA CHEST FINDINGS  Cardiovascular: The thoracic aorta is normal in size. There is no evidence for  aneurysm or dissection. Origin of the great vessels demonstrates bovine arch anatomy. Origin the great vessels are patent. The heart is enlarged. There is a small pericardial effusion there is no central pulmonary embolism. Mediastinum/Nodes: No enlarged lymph nodes are seen. Visualized esophagus and thyroid gland are within normal limits. Lungs/Pleura: There is right mainstem intubation. Trachea and central airways are patent. There is patchy bilateral lower lobe airspace disease, left greater than right and a small amount of airspace disease in the left upper lobe. There are trace bilateral pleural effusions. Musculoskeletal: There is nondisplaced fourth, fifth and sixth anterior rib fractures bilaterally. Review of the MIP images confirms the above findings. CTA ABDOMEN AND PELVIS FINDINGS VASCULAR Aorta: Normal caliber aorta without aneurysm, dissection, vasculitis or significant stenosis. There are atherosclerotic calcifications of the aorta. Celiac: Patent without evidence of aneurysm, dissection, vasculitis or significant stenosis. SMA: Patent without evidence of aneurysm, dissection, vasculitis or significant stenosis. Renals: Both renal arteries are patent without evidence of aneurysm, dissection, vasculitis, fibromuscular dysplasia or significant stenosis. IMA: Patent without evidence of aneurysm, dissection, vasculitis or significant stenosis. Inflow: Patent without evidence of aneurysm, dissection, vasculitis or significant stenosis. Veins: No obvious venous abnormality within the limitations of this arterial phase study. Review of the MIP images confirms the above findings. NON-VASCULAR Hepatobiliary: No focal liver abnormality is seen. Status post cholecystectomy. No biliary dilatation. Pancreas: Unremarkable. No pancreatic ductal dilatation or surrounding inflammatory changes. Spleen: Normal in size without focal  abnormality. Adrenals/Urinary Tract: There is mild nonspecific bilateral perinephric fat stranding. No hydronephrosis. Bilateral adrenal glands are within normal limits. The bladder is under distended. Diffuse bladder wall thickening is not excluded. Stomach/Bowel: Stomach is within normal limits. Appendix appears normal. No evidence of bowel wall thickening, distention, or inflammatory changes. There is an air-fluid levels scattered throughout the colon. Lymphatic: There are enlarged bilateral inguinal lymph nodes measuring up to 14 mm. There is no ascites. Reproductive: Calcified uterine fibroids are present measuring to 2.8 cm. Ovaries are not well delineated on this study. Other: There is some mild stranding surrounding the IVC, nonspecific. There is also some stranding tracking along the retroperitoneum. Diffuse body wall edema is present. Musculoskeletal: Degenerative changes affect the spine. No acute fracture or focal osseous lesion identified. There is extensive soft tissue edema and soft tissue air in the inferior right buttocks and bilateral perineal region, right greater than left. There is also soft tissue gas in the mons pubis. No definite enhancing fluid collection identified. Review of the MIP images confirms the above findings. IMPRESSION: 1. No evidence for aortic dissection or aneurysm. 2. Right mainstem intubation.  Recommend repositioning. 3. Bilateral lower lobe and left upper lobe airspace disease worrisome for pneumonia. 4. Trace bilateral pleural effusions. 5. Cardiomegaly with small pericardial effusion. 6. Nondisplaced anterior fourth, fifth and sixth rib fractures bilaterally. 7. Extensive soft tissue edema and soft tissue air in the inferior right buttocks, bilateral perineal region and mons pubis. Findings are worrisome for infection including necrotizing infection. No definite enhancing fluid collection identified. 8. Mild stranding surrounding the IVC and tracking along the  retroperitoneum is nonspecific. 9. Diffuse body wall edema. 10. Mild bilateral perinephric fat stranding. Correlate clinically for infection. 11. Diffuse bladder wall thickening versus under distention. Correlate clinically for cystitis. 12. Air-fluid levels scattered throughout the colon compatible with diarrheal illness. 13. Bilateral inguinal lymphadenopathy. 14. Aortic atherosclerosis. Aortic Atherosclerosis (ICD10-I70.0). Electronically Signed   By: Tyron Gallon M.D.   On: May 19, 2024 21:51   DG Chest [name] [address]  Result Date: 04/28/2024 CLINICAL DATA:  Questionable sepsis - evaluate for abnormality EXAM: PORTABLE CHEST - 1 VIEW COMPARISON:  Apr 17, 2024 FINDINGS: Elevation of the right hemidiaphragm. No focal airspace consolidation, pleural effusion, or pneumothorax. Unchanged moderate cardiomegaly. Aortic atherosclerosis. No acute fracture or destructive lesion. Multilevel thoracic osteophytosis. IMPRESSION: Moderate cardiomegaly, unchanged. Otherwise, no acute cardiopulmonary abnormality. Electronically Signed   By: Rance Burrows M.D.   On: 04/28/2024 16:28   CT ABDOMEN PELVIS WO CONTRAST Result Date: 04/17/2024 CLINICAL DATA:  Abdominal pain, acute, nonlocalized 38F when her home health nurse checked on her today, and Pt reports increasing weakness since Monday. EXAM: CT ABDOMEN AND PELVIS WITHOUT CONTRAST TECHNIQUE: Multidetector CT imaging of the abdomen and pelvis was performed following the standard protocol without IV contrast. RADIATION DOSE REDUCTION: This exam was performed according to the departmental dose-optimization program which includes automated exposure control, adjustment of the mA and/or kV according to patient size and/or use of iterative reconstruction technique. COMPARISON:  CT abdomen pelvis 02/16/2024. FINDINGS: Lower chest: At least small volume pericardial effusion. Cardiac changes suggestive of anemia. Coronary artery calcification. Hepatobiliary: No focal liver  abnormality. Status post cholecystectomy. No biliary dilatation. Pancreas: No focal lesion. Normal pancreatic contour. No surrounding inflammatory changes. No main pancreatic ductal dilatation. Spleen: Normal in size without focal abnormality. Adrenals/Urinary Tract: No adrenal nodule bilaterally. Bilateral renal scarring. No nephrolithiasis and no hydronephrosis. No definite contour-deforming renal mass. No ureterolithiasis or hydroureter. The urinary bladder is decompressed. Stomach/Bowel: Stomach is within normal limits. No evidence of bowel wall thickening or dilatation. Appendix appears normal. Vascular/Lymphatic: No abdominal aorta or iliac aneurysm. Extensive atherosclerotic plaque of the aorta and its branches. No abdominal, pelvic, or inguinal lymphadenopathy. Reproductive: Lobulated uterine contour with calcified and enhancing fundal mass suggestive of a degenerative uterine fibroid. Otherwise uterus and bilateral adnexa are unremarkable. Other: No intraperitoneal free fluid. No intraperitoneal free gas. No organized fluid collection. Musculoskeletal: No abdominal wall hernia or abnormality. Calcified granulomas of the gluteal soft tissues. No suspicious lytic or blastic osseous lesions. No acute displaced fracture. Multilevel degenerative changes of the spine. IMPRESSION: 1. No acute intra-abdominal or intrapelvic abnormality with limited evaluation on this noncontrast study. 2. Uterine fibroids. 3.  Aortic Atherosclerosis (ICD10-I70.0)-extensive. Electronically Signed   By: Morgane  Naveau M.D.   On: 04/17/2024 18:50   DG Chest Port 1 View Result Date: 04/17/2024 CLINICAL DATA:  Questionable sepsis - evaluate for abnormality EXAM: PORTABLE CHEST - 1 VIEW COMPARISON:  June 24, 2023 FINDINGS: No focal airspace consolidation, pleural effusion, or pneumothorax. Unchanged moderate cardiomegaly. Aortic atherosclerosis. No acute fracture or destructive lesion. IMPRESSION: Unchanged moderate cardiomegaly.  Otherwise, no pneumonia or pulmonary edema. Electronically Signed   By: Rance Burrows M.D.   On: 04/17/2024 18:21    Microbiology: No results found for this or any previous visit (from the past 240 hours).  Time spent: 32 minutes  Signed: Claven Cumming, MD 05/03/24

## 2024-05-17 ENCOUNTER — Telehealth: Payer: Self-pay | Admitting: Orthopedic Surgery

## 2024-05-17 NOTE — Telephone Encounter (Signed)
 [facility] from [facility] has passed away but they have fax past orders that still need [facility] to sign send sign and send back Monday 6/30. [facility] number is [phone].

## 2024-05-20 ENCOUNTER — Ambulatory Visit: Admitting: Adult Health

## 2024-05-21 NOTE — Progress Notes (Signed)
 Two Neo-sticks removed by override per verbal order from Dr. Claven Cumming.

## 2024-05-21 NOTE — Telephone Encounter (Signed)
Condolence card sent to family

## 2024-05-21 NOTE — ED Provider Notes (Signed)
 Procedure Name: Intubation Date/Time: 04/09/2024 8:00 PM  Performed by: Sallyanne Creamer, DOPre-anesthesia Checklist: Patient identified, Patient being monitored, Emergency Drugs available, Timeout performed and Suction available Oxygen Delivery Method: Non-rebreather mask Preoxygenation: Pre-oxygenation with 100% oxygen Induction Type: Rapid sequence Ventilation: Mask ventilation without difficulty Laryngoscope Size: Glidescope and 4 Grade View: Grade I Tube size: 7.5 mm Number of attempts: 1 Airway Equipment and Method: Video-laryngoscopy Placement Confirmation: ETT inserted through vocal cords under direct vision, CO2 detector and Breath sounds checked- equal and bilateral Secured at: 24 cm Tube secured with: ETT holder        Sallyanne Creamer, DO 04/24/24 [phone]

## 2024-07-30 MED FILL — Medication: Qty: 1 | Status: AC
